# Patient Record
Sex: Female | Born: 1970
Health system: Southern US, Community
[De-identification: ages and names within clinical notes are randomized; demographics above are authoritative.]

## PROBLEM LIST (undated history)

## (undated) DIAGNOSIS — I82409 Acute embolism and thrombosis of unspecified deep veins of unspecified lower extremity: Secondary | ICD-10-CM

## (undated) DIAGNOSIS — I10 Essential (primary) hypertension: Secondary | ICD-10-CM

## (undated) DIAGNOSIS — E669 Obesity, unspecified: Secondary | ICD-10-CM

## (undated) DIAGNOSIS — F429 Obsessive-compulsive disorder, unspecified: Secondary | ICD-10-CM

## (undated) DIAGNOSIS — J40 Bronchitis, not specified as acute or chronic: Secondary | ICD-10-CM

## (undated) DIAGNOSIS — J45901 Unspecified asthma with (acute) exacerbation: Secondary | ICD-10-CM

## (undated) DIAGNOSIS — E119 Type 2 diabetes mellitus without complications: Secondary | ICD-10-CM

## (undated) DIAGNOSIS — D649 Anemia, unspecified: Secondary | ICD-10-CM

## (undated) DIAGNOSIS — G629 Polyneuropathy, unspecified: Secondary | ICD-10-CM

## (undated) DIAGNOSIS — M549 Dorsalgia, unspecified: Secondary | ICD-10-CM

## (undated) DIAGNOSIS — R011 Cardiac murmur, unspecified: Secondary | ICD-10-CM

## (undated) DIAGNOSIS — B9681 Helicobacter pylori [H. pylori] as the cause of diseases classified elsewhere: Secondary | ICD-10-CM

## (undated) DIAGNOSIS — K2970 Gastritis, unspecified, without bleeding: Secondary | ICD-10-CM

## (undated) DIAGNOSIS — F32A Depression, unspecified: Secondary | ICD-10-CM

## (undated) DIAGNOSIS — R109 Unspecified abdominal pain: Secondary | ICD-10-CM

## (undated) DIAGNOSIS — K219 Gastro-esophageal reflux disease without esophagitis: Secondary | ICD-10-CM

## (undated) DIAGNOSIS — G8929 Other chronic pain: Secondary | ICD-10-CM

## (undated) DIAGNOSIS — J45909 Unspecified asthma, uncomplicated: Secondary | ICD-10-CM

## (undated) DIAGNOSIS — K5909 Other constipation: Secondary | ICD-10-CM

## (undated) DIAGNOSIS — J302 Other seasonal allergic rhinitis: Secondary | ICD-10-CM

## (undated) DIAGNOSIS — F4310 Post-traumatic stress disorder, unspecified: Secondary | ICD-10-CM

## (undated) DIAGNOSIS — K589 Irritable bowel syndrome without diarrhea: Secondary | ICD-10-CM

## (undated) DIAGNOSIS — K5903 Drug induced constipation: Secondary | ICD-10-CM

## (undated) DIAGNOSIS — F060 Psychotic disorder with hallucinations due to known physiological condition: Secondary | ICD-10-CM

## (undated) DIAGNOSIS — G43909 Migraine, unspecified, not intractable, without status migrainosus: Secondary | ICD-10-CM

## (undated) DIAGNOSIS — R569 Unspecified convulsions: Secondary | ICD-10-CM

## (undated) DIAGNOSIS — R0602 Shortness of breath: Secondary | ICD-10-CM

## (undated) DIAGNOSIS — T402X5A Adverse effect of other opioids, initial encounter: Secondary | ICD-10-CM

## (undated) DIAGNOSIS — K56609 Unspecified intestinal obstruction, unspecified as to partial versus complete obstruction: Secondary | ICD-10-CM

## (undated) DIAGNOSIS — F329 Major depressive disorder, single episode, unspecified: Secondary | ICD-10-CM

## (undated) HISTORY — DX: Post-traumatic stress disorder, unspecified: F43.10

## (undated) HISTORY — DX: Drug induced constipation: K59.03

## (undated) HISTORY — PX: OOPHORECTOMY: SHX86

## (undated) HISTORY — DX: Helicobacter pylori (H. pylori) as the cause of diseases classified elsewhere: B96.81

## (undated) HISTORY — DX: Obsessive-compulsive disorder, unspecified: F42.9

## (undated) HISTORY — DX: Dorsalgia, unspecified: M54.9

## (undated) HISTORY — DX: Irritable bowel syndrome, unspecified: K58.9

## (undated) HISTORY — DX: Type 2 diabetes mellitus without complications: E11.9

## (undated) HISTORY — DX: Drug induced constipation: T40.2X5A

## (undated) HISTORY — PX: PARTIAL HYSTERECTOMY: SHX80

## (undated) HISTORY — PX: CARPAL TUNNEL RELEASE: SHX101

## (undated) HISTORY — PX: SPINE SURGERY: SHX786

## (undated) HISTORY — PX: COLON SURGERY: SHX602

## (undated) HISTORY — DX: Migraine, unspecified, not intractable, without status migrainosus: G43.909

## (undated) HISTORY — DX: Obesity, unspecified: E66.9

## (undated) HISTORY — DX: Acute embolism and thrombosis of unspecified deep veins of unspecified lower extremity: I82.409

## (undated) HISTORY — DX: Gastritis, unspecified, without bleeding: K29.70

## (undated) HISTORY — DX: Unspecified intestinal obstruction, unspecified as to partial versus complete obstruction: K56.609

## (undated) HISTORY — DX: Essential (primary) hypertension: I10

## (undated) HISTORY — PX: SPINAL CORD STIMULATOR IMPLANT: SHX2422

---
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
## 1992-11-04 HISTORY — PX: TUBAL LIGATION: SHX77

## 2001-04-20 ENCOUNTER — Emergency Department (HOSPITAL_COMMUNITY): Admission: EM | Admit: 2001-04-20 | Discharge: 2001-04-20 | Payer: Self-pay | Admitting: *Deleted

## 2001-11-04 HISTORY — PX: LYSIS OF ADHESION: SHX5961

## 2001-11-11 ENCOUNTER — Ambulatory Visit (HOSPITAL_BASED_OUTPATIENT_CLINIC_OR_DEPARTMENT_OTHER): Admission: RE | Admit: 2001-11-11 | Discharge: 2001-11-11 | Payer: Self-pay | Admitting: Orthopedic Surgery

## 2001-11-23 ENCOUNTER — Encounter: Admission: RE | Admit: 2001-11-23 | Discharge: 2002-02-21 | Payer: Self-pay | Admitting: Orthopedic Surgery

## 2002-02-09 ENCOUNTER — Emergency Department (HOSPITAL_COMMUNITY): Admission: EM | Admit: 2002-02-09 | Discharge: 2002-02-09 | Payer: Self-pay | Admitting: Emergency Medicine

## 2002-04-01 ENCOUNTER — Inpatient Hospital Stay (HOSPITAL_COMMUNITY): Admission: EM | Admit: 2002-04-01 | Discharge: 2002-04-07 | Payer: Self-pay | Admitting: Emergency Medicine

## 2002-04-01 ENCOUNTER — Encounter: Payer: Self-pay | Admitting: Emergency Medicine

## 2002-04-01 ENCOUNTER — Encounter: Payer: Self-pay | Admitting: General Surgery

## 2002-04-02 ENCOUNTER — Encounter: Payer: Self-pay | Admitting: Family Medicine

## 2002-04-17 ENCOUNTER — Emergency Department (HOSPITAL_COMMUNITY): Admission: EM | Admit: 2002-04-17 | Discharge: 2002-04-17 | Payer: Self-pay | Admitting: Internal Medicine

## 2002-04-22 ENCOUNTER — Encounter: Payer: Self-pay | Admitting: Internal Medicine

## 2002-04-23 ENCOUNTER — Observation Stay (HOSPITAL_COMMUNITY): Admission: EM | Admit: 2002-04-23 | Discharge: 2002-04-24 | Payer: Self-pay | Admitting: Internal Medicine

## 2002-04-23 ENCOUNTER — Encounter: Payer: Self-pay | Admitting: Internal Medicine

## 2002-04-24 ENCOUNTER — Encounter: Payer: Self-pay | Admitting: Family Medicine

## 2002-07-30 ENCOUNTER — Encounter: Payer: Self-pay | Admitting: General Surgery

## 2002-07-30 ENCOUNTER — Ambulatory Visit (HOSPITAL_COMMUNITY): Admission: RE | Admit: 2002-07-30 | Discharge: 2002-07-30 | Payer: Self-pay | Admitting: General Surgery

## 2002-09-09 ENCOUNTER — Emergency Department (HOSPITAL_COMMUNITY): Admission: EM | Admit: 2002-09-09 | Discharge: 2002-09-09 | Payer: Self-pay | Admitting: Emergency Medicine

## 2002-10-12 ENCOUNTER — Ambulatory Visit (HOSPITAL_COMMUNITY): Admission: RE | Admit: 2002-10-12 | Discharge: 2002-10-12 | Payer: Self-pay | Admitting: General Surgery

## 2002-10-12 ENCOUNTER — Encounter: Payer: Self-pay | Admitting: General Surgery

## 2002-11-05 ENCOUNTER — Emergency Department (HOSPITAL_COMMUNITY): Admission: EM | Admit: 2002-11-05 | Discharge: 2002-11-05 | Payer: Self-pay | Admitting: Emergency Medicine

## 2002-11-05 ENCOUNTER — Encounter: Payer: Self-pay | Admitting: Emergency Medicine

## 2002-11-11 ENCOUNTER — Ambulatory Visit (HOSPITAL_COMMUNITY): Admission: RE | Admit: 2002-11-11 | Discharge: 2002-11-11 | Payer: Self-pay | Admitting: General Surgery

## 2002-11-11 ENCOUNTER — Encounter: Payer: Self-pay | Admitting: General Surgery

## 2002-12-05 ENCOUNTER — Emergency Department (HOSPITAL_COMMUNITY): Admission: EM | Admit: 2002-12-05 | Discharge: 2002-12-05 | Payer: Self-pay | Admitting: *Deleted

## 2003-04-21 ENCOUNTER — Ambulatory Visit (HOSPITAL_COMMUNITY): Admission: RE | Admit: 2003-04-21 | Discharge: 2003-04-21 | Payer: Self-pay | Admitting: Family Medicine

## 2003-04-21 ENCOUNTER — Encounter: Payer: Self-pay | Admitting: Family Medicine

## 2003-07-04 ENCOUNTER — Ambulatory Visit (HOSPITAL_COMMUNITY): Admission: RE | Admit: 2003-07-04 | Discharge: 2003-07-04 | Payer: Self-pay | Admitting: Family Medicine

## 2003-07-04 ENCOUNTER — Encounter: Payer: Self-pay | Admitting: Family Medicine

## 2003-08-23 ENCOUNTER — Ambulatory Visit (HOSPITAL_COMMUNITY): Admission: RE | Admit: 2003-08-23 | Discharge: 2003-08-23 | Payer: Self-pay | Admitting: Neurology

## 2003-11-16 ENCOUNTER — Emergency Department (HOSPITAL_COMMUNITY): Admission: EM | Admit: 2003-11-16 | Discharge: 2003-11-16 | Payer: Self-pay | Admitting: Emergency Medicine

## 2004-07-10 ENCOUNTER — Ambulatory Visit: Payer: Self-pay | Admitting: Family Medicine

## 2004-08-25 ENCOUNTER — Emergency Department (HOSPITAL_COMMUNITY): Admission: EM | Admit: 2004-08-25 | Discharge: 2004-08-25 | Payer: Self-pay | Admitting: Emergency Medicine

## 2004-08-30 ENCOUNTER — Ambulatory Visit: Payer: Self-pay | Admitting: *Deleted

## 2004-09-05 ENCOUNTER — Ambulatory Visit: Payer: Self-pay | Admitting: Family Medicine

## 2004-10-08 ENCOUNTER — Ambulatory Visit: Payer: Self-pay | Admitting: Family Medicine

## 2004-10-09 ENCOUNTER — Ambulatory Visit (HOSPITAL_COMMUNITY): Admission: RE | Admit: 2004-10-09 | Discharge: 2004-10-09 | Payer: Self-pay | Admitting: Family Medicine

## 2004-10-23 ENCOUNTER — Ambulatory Visit (HOSPITAL_COMMUNITY): Admission: RE | Admit: 2004-10-23 | Discharge: 2004-10-23 | Payer: Self-pay | Admitting: Family Medicine

## 2004-12-03 ENCOUNTER — Ambulatory Visit: Payer: Self-pay | Admitting: Family Medicine

## 2004-12-20 ENCOUNTER — Encounter: Admission: RE | Admit: 2004-12-20 | Discharge: 2005-03-20 | Payer: Self-pay | Admitting: Orthopedic Surgery

## 2005-03-26 ENCOUNTER — Ambulatory Visit: Payer: Self-pay | Admitting: Family Medicine

## 2005-04-25 ENCOUNTER — Ambulatory Visit: Payer: Self-pay | Admitting: Family Medicine

## 2005-05-23 ENCOUNTER — Emergency Department (HOSPITAL_COMMUNITY): Admission: EM | Admit: 2005-05-23 | Discharge: 2005-05-23 | Payer: Self-pay | Admitting: Emergency Medicine

## 2005-07-04 ENCOUNTER — Inpatient Hospital Stay (HOSPITAL_COMMUNITY): Admission: RE | Admit: 2005-07-04 | Discharge: 2005-07-07 | Payer: Self-pay | Admitting: Orthopedic Surgery

## 2005-07-11 ENCOUNTER — Encounter: Admission: RE | Admit: 2005-07-11 | Discharge: 2005-10-09 | Payer: Self-pay | Admitting: Orthopedic Surgery

## 2005-09-09 ENCOUNTER — Ambulatory Visit: Payer: Self-pay | Admitting: Family Medicine

## 2005-09-10 ENCOUNTER — Ambulatory Visit (HOSPITAL_COMMUNITY): Admission: RE | Admit: 2005-09-10 | Discharge: 2005-09-10 | Payer: Self-pay | Admitting: Family Medicine

## 2005-09-16 ENCOUNTER — Ambulatory Visit: Payer: Self-pay | Admitting: Family Medicine

## 2005-11-21 ENCOUNTER — Encounter: Admission: RE | Admit: 2005-11-21 | Discharge: 2006-02-19 | Payer: Self-pay | Admitting: Orthopedic Surgery

## 2005-12-22 ENCOUNTER — Emergency Department (HOSPITAL_COMMUNITY): Admission: EM | Admit: 2005-12-22 | Discharge: 2005-12-22 | Payer: Self-pay | Admitting: Emergency Medicine

## 2005-12-27 ENCOUNTER — Ambulatory Visit: Payer: Self-pay | Admitting: Family Medicine

## 2006-01-15 ENCOUNTER — Ambulatory Visit: Payer: Self-pay | Admitting: Family Medicine

## 2006-02-07 ENCOUNTER — Emergency Department (HOSPITAL_COMMUNITY): Admission: EM | Admit: 2006-02-07 | Discharge: 2006-02-07 | Payer: Self-pay | Admitting: Emergency Medicine

## 2006-02-19 ENCOUNTER — Ambulatory Visit: Payer: Self-pay | Admitting: Family Medicine

## 2006-02-20 ENCOUNTER — Ambulatory Visit (HOSPITAL_COMMUNITY): Admission: RE | Admit: 2006-02-20 | Discharge: 2006-02-20 | Payer: Self-pay | Admitting: Unknown Physician Specialty

## 2006-02-28 ENCOUNTER — Encounter: Admission: RE | Admit: 2006-02-28 | Discharge: 2006-02-28 | Payer: Self-pay | Admitting: Family Medicine

## 2006-04-02 ENCOUNTER — Ambulatory Visit: Payer: Self-pay | Admitting: Family Medicine

## 2006-04-07 ENCOUNTER — Emergency Department (HOSPITAL_COMMUNITY): Admission: EM | Admit: 2006-04-07 | Discharge: 2006-04-07 | Payer: Self-pay | Admitting: Emergency Medicine

## 2006-05-12 ENCOUNTER — Ambulatory Visit: Payer: Self-pay | Admitting: Family Medicine

## 2006-05-12 ENCOUNTER — Other Ambulatory Visit: Admission: RE | Admit: 2006-05-12 | Discharge: 2006-05-12 | Payer: Self-pay | Admitting: Family Medicine

## 2006-08-12 ENCOUNTER — Ambulatory Visit: Payer: Self-pay | Admitting: Family Medicine

## 2006-08-15 ENCOUNTER — Encounter: Admission: RE | Admit: 2006-08-15 | Discharge: 2006-08-15 | Payer: Self-pay | Admitting: Family Medicine

## 2006-08-20 ENCOUNTER — Emergency Department (HOSPITAL_COMMUNITY): Admission: EM | Admit: 2006-08-20 | Discharge: 2006-08-20 | Payer: Self-pay | Admitting: Emergency Medicine

## 2006-09-04 ENCOUNTER — Ambulatory Visit: Payer: Self-pay | Admitting: Family Medicine

## 2006-09-20 ENCOUNTER — Emergency Department (HOSPITAL_COMMUNITY): Admission: EM | Admit: 2006-09-20 | Discharge: 2006-09-20 | Payer: Self-pay | Admitting: Emergency Medicine

## 2006-10-02 ENCOUNTER — Ambulatory Visit: Payer: Self-pay | Admitting: Family Medicine

## 2006-10-09 ENCOUNTER — Ambulatory Visit (HOSPITAL_COMMUNITY): Admission: RE | Admit: 2006-10-09 | Discharge: 2006-10-09 | Payer: Self-pay | Admitting: Family Medicine

## 2006-10-22 ENCOUNTER — Ambulatory Visit: Payer: Self-pay | Admitting: Family Medicine

## 2006-12-16 ENCOUNTER — Ambulatory Visit: Payer: Self-pay | Admitting: Family Medicine

## 2007-01-02 ENCOUNTER — Encounter: Payer: Self-pay | Admitting: Family Medicine

## 2007-01-02 LAB — CONVERTED CEMR LAB
AST: 17 units/L (ref 0–37)
Albumin: 4.4 g/dL (ref 3.5–5.2)
Alkaline Phosphatase: 74 units/L (ref 39–117)
Basophils Absolute: 0.1 10*3/uL (ref 0.0–0.1)
Basophils Relative: 1 % (ref 0–1)
Bilirubin, Direct: 0.1 mg/dL (ref 0.0–0.3)
Cholesterol: 173 mg/dL (ref 0–200)
Creatinine, Ser: 0.72 mg/dL (ref 0.40–1.20)
Eosinophils Absolute: 0.3 10*3/uL (ref 0.0–0.7)
Eosinophils Relative: 5 % (ref 0–5)
Indirect Bilirubin: 0.3 mg/dL (ref 0.0–0.9)
Lymphs Abs: 2.2 10*3/uL (ref 0.7–3.3)
MCV: 89.7 fL (ref 78.0–100.0)
Monocytes Absolute: 0.6 10*3/uL (ref 0.2–0.7)
Monocytes Relative: 10 % (ref 3–11)
Neutrophils Relative %: 45 % (ref 43–77)
Potassium: 4.1 meq/L (ref 3.5–5.3)
RBC: 4.26 M/uL (ref 3.87–5.11)
RDW: 14.3 % — ABNORMAL HIGH (ref 11.5–14.0)
Total Bilirubin: 0.4 mg/dL (ref 0.3–1.2)
Total CHOL/HDL Ratio: 2.7
Triglycerides: 67 mg/dL (ref <150)
VLDL: 13 mg/dL (ref 0–40)

## 2007-01-30 ENCOUNTER — Encounter: Admission: RE | Admit: 2007-01-30 | Discharge: 2007-01-30 | Payer: Self-pay | Admitting: Family Medicine

## 2007-03-21 ENCOUNTER — Emergency Department (HOSPITAL_COMMUNITY): Admission: EM | Admit: 2007-03-21 | Discharge: 2007-03-22 | Payer: Self-pay | Admitting: Emergency Medicine

## 2007-03-22 ENCOUNTER — Emergency Department (HOSPITAL_COMMUNITY): Admission: EM | Admit: 2007-03-22 | Discharge: 2007-03-22 | Payer: Self-pay | Admitting: Emergency Medicine

## 2007-03-23 ENCOUNTER — Ambulatory Visit: Payer: Self-pay | Admitting: Family Medicine

## 2007-05-13 ENCOUNTER — Encounter: Payer: Self-pay | Admitting: Family Medicine

## 2007-05-13 ENCOUNTER — Ambulatory Visit: Payer: Self-pay | Admitting: Family Medicine

## 2007-05-13 ENCOUNTER — Other Ambulatory Visit: Admission: RE | Admit: 2007-05-13 | Discharge: 2007-05-13 | Payer: Self-pay | Admitting: Family Medicine

## 2007-05-13 LAB — CONVERTED CEMR LAB: Chlamydia, DNA Probe: NEGATIVE

## 2007-05-14 ENCOUNTER — Encounter: Payer: Self-pay | Admitting: Family Medicine

## 2007-05-14 LAB — CONVERTED CEMR LAB
Candida species: NEGATIVE
Gardnerella vaginalis: POSITIVE — AB

## 2007-07-28 ENCOUNTER — Ambulatory Visit: Payer: Self-pay | Admitting: Family Medicine

## 2007-07-31 ENCOUNTER — Ambulatory Visit (HOSPITAL_COMMUNITY): Admission: RE | Admit: 2007-07-31 | Discharge: 2007-07-31 | Payer: Self-pay | Admitting: Family Medicine

## 2007-08-04 ENCOUNTER — Ambulatory Visit: Payer: Self-pay | Admitting: Family Medicine

## 2007-09-24 ENCOUNTER — Ambulatory Visit: Payer: Self-pay | Admitting: Family Medicine

## 2007-09-28 ENCOUNTER — Ambulatory Visit: Payer: Self-pay | Admitting: Family Medicine

## 2007-11-05 HISTORY — PX: TRIGGER FINGER RELEASE: SHX641

## 2007-11-23 ENCOUNTER — Ambulatory Visit: Payer: Self-pay | Admitting: Family Medicine

## 2007-11-27 ENCOUNTER — Encounter: Payer: Self-pay | Admitting: Family Medicine

## 2007-11-27 DIAGNOSIS — M549 Dorsalgia, unspecified: Secondary | ICD-10-CM | POA: Insufficient documentation

## 2007-11-27 DIAGNOSIS — G43909 Migraine, unspecified, not intractable, without status migrainosus: Secondary | ICD-10-CM | POA: Insufficient documentation

## 2007-11-27 DIAGNOSIS — I10 Essential (primary) hypertension: Secondary | ICD-10-CM | POA: Insufficient documentation

## 2008-01-25 ENCOUNTER — Ambulatory Visit: Payer: Self-pay | Admitting: Family Medicine

## 2008-06-03 ENCOUNTER — Emergency Department (HOSPITAL_COMMUNITY): Admission: EM | Admit: 2008-06-03 | Discharge: 2008-06-03 | Payer: Self-pay | Admitting: Emergency Medicine

## 2008-11-04 DIAGNOSIS — I82409 Acute embolism and thrombosis of unspecified deep veins of unspecified lower extremity: Secondary | ICD-10-CM

## 2008-11-04 HISTORY — DX: Acute embolism and thrombosis of unspecified deep veins of unspecified lower extremity: I82.409

## 2008-11-04 HISTORY — PX: LUMBAR SPINE SURGERY: SHX701

## 2008-12-30 ENCOUNTER — Emergency Department (HOSPITAL_COMMUNITY): Admission: EM | Admit: 2008-12-30 | Discharge: 2008-12-30 | Payer: Self-pay | Admitting: Emergency Medicine

## 2009-01-05 ENCOUNTER — Ambulatory Visit: Payer: Self-pay | Admitting: Family Medicine

## 2009-01-05 DIAGNOSIS — R7303 Prediabetes: Secondary | ICD-10-CM | POA: Insufficient documentation

## 2009-01-06 ENCOUNTER — Telehealth: Payer: Self-pay | Admitting: Family Medicine

## 2009-01-08 DIAGNOSIS — F319 Bipolar disorder, unspecified: Secondary | ICD-10-CM | POA: Insufficient documentation

## 2009-01-12 ENCOUNTER — Telehealth: Payer: Self-pay | Admitting: Family Medicine

## 2009-02-06 ENCOUNTER — Emergency Department (HOSPITAL_COMMUNITY): Admission: EM | Admit: 2009-02-06 | Discharge: 2009-02-06 | Payer: Self-pay | Admitting: Emergency Medicine

## 2009-02-08 ENCOUNTER — Telehealth: Payer: Self-pay | Admitting: Family Medicine

## 2009-03-09 ENCOUNTER — Ambulatory Visit: Payer: Self-pay | Admitting: Family Medicine

## 2009-03-09 LAB — CONVERTED CEMR LAB: Hgb A1c MFr Bld: 5.8 %

## 2009-04-13 ENCOUNTER — Ambulatory Visit: Payer: Self-pay | Admitting: Family Medicine

## 2009-04-14 ENCOUNTER — Telehealth: Payer: Self-pay | Admitting: Family Medicine

## 2009-04-17 ENCOUNTER — Ambulatory Visit (HOSPITAL_COMMUNITY): Admission: RE | Admit: 2009-04-17 | Discharge: 2009-04-17 | Payer: Self-pay | Admitting: Family Medicine

## 2009-04-18 ENCOUNTER — Telehealth: Payer: Self-pay | Admitting: Family Medicine

## 2009-04-18 ENCOUNTER — Encounter: Payer: Self-pay | Admitting: Family Medicine

## 2009-04-20 ENCOUNTER — Ambulatory Visit (HOSPITAL_COMMUNITY): Admission: RE | Admit: 2009-04-20 | Discharge: 2009-04-20 | Payer: Self-pay | Admitting: Family Medicine

## 2009-04-25 ENCOUNTER — Encounter: Payer: Self-pay | Admitting: Family Medicine

## 2009-05-11 ENCOUNTER — Other Ambulatory Visit: Admission: RE | Admit: 2009-05-11 | Discharge: 2009-05-11 | Payer: Self-pay | Admitting: Family Medicine

## 2009-05-11 ENCOUNTER — Ambulatory Visit: Payer: Self-pay | Admitting: Family Medicine

## 2009-05-11 ENCOUNTER — Encounter: Payer: Self-pay | Admitting: Family Medicine

## 2009-05-16 ENCOUNTER — Ambulatory Visit (HOSPITAL_COMMUNITY): Admission: RE | Admit: 2009-05-16 | Discharge: 2009-05-18 | Payer: Self-pay | Admitting: Neurosurgery

## 2009-05-17 ENCOUNTER — Encounter: Payer: Self-pay | Admitting: Family Medicine

## 2009-06-23 ENCOUNTER — Encounter: Admission: RE | Admit: 2009-06-23 | Discharge: 2009-08-22 | Payer: Self-pay | Admitting: Neurosurgery

## 2009-06-27 ENCOUNTER — Ambulatory Visit: Payer: Self-pay | Admitting: Family Medicine

## 2009-06-28 ENCOUNTER — Encounter: Payer: Self-pay | Admitting: Family Medicine

## 2009-06-28 LAB — CONVERTED CEMR LAB
Chlamydia, DNA Probe: NEGATIVE
GC Probe Amp, Genital: NEGATIVE

## 2009-06-29 ENCOUNTER — Telehealth: Payer: Self-pay | Admitting: Family Medicine

## 2009-06-29 ENCOUNTER — Encounter: Payer: Self-pay | Admitting: Family Medicine

## 2009-06-29 LAB — CONVERTED CEMR LAB
Candida species: NEGATIVE
Gardnerella vaginalis: POSITIVE — AB

## 2009-08-11 ENCOUNTER — Ambulatory Visit: Payer: Self-pay | Admitting: Family Medicine

## 2009-08-14 ENCOUNTER — Encounter: Payer: Self-pay | Admitting: Family Medicine

## 2009-08-21 ENCOUNTER — Encounter: Admission: RE | Admit: 2009-08-21 | Discharge: 2009-08-21 | Payer: Self-pay | Admitting: Neurosurgery

## 2009-08-24 ENCOUNTER — Encounter: Payer: Self-pay | Admitting: Family Medicine

## 2009-09-12 ENCOUNTER — Inpatient Hospital Stay (HOSPITAL_COMMUNITY): Admission: RE | Admit: 2009-09-12 | Discharge: 2009-10-03 | Payer: Self-pay | Admitting: Neurosurgery

## 2009-09-19 ENCOUNTER — Encounter (INDEPENDENT_AMBULATORY_CARE_PROVIDER_SITE_OTHER): Payer: Self-pay | Admitting: Internal Medicine

## 2009-09-19 ENCOUNTER — Ambulatory Visit: Payer: Self-pay | Admitting: Surgery

## 2009-09-20 ENCOUNTER — Telehealth: Payer: Self-pay | Admitting: Family Medicine

## 2009-09-21 ENCOUNTER — Telehealth: Payer: Self-pay | Admitting: Family Medicine

## 2009-10-03 ENCOUNTER — Inpatient Hospital Stay (HOSPITAL_COMMUNITY): Admission: EM | Admit: 2009-10-03 | Discharge: 2009-10-06 | Payer: Self-pay | Admitting: Psychiatry

## 2009-10-03 ENCOUNTER — Ambulatory Visit: Payer: Self-pay | Admitting: Psychiatry

## 2009-10-06 ENCOUNTER — Encounter: Payer: Self-pay | Admitting: Family Medicine

## 2009-10-07 ENCOUNTER — Telehealth: Payer: Self-pay | Admitting: Family Medicine

## 2009-10-07 ENCOUNTER — Encounter (INDEPENDENT_AMBULATORY_CARE_PROVIDER_SITE_OTHER): Payer: Self-pay | Admitting: Emergency Medicine

## 2009-10-07 ENCOUNTER — Emergency Department (HOSPITAL_COMMUNITY): Admission: EM | Admit: 2009-10-07 | Discharge: 2009-10-07 | Payer: Self-pay | Admitting: Emergency Medicine

## 2009-10-09 ENCOUNTER — Telehealth: Payer: Self-pay | Admitting: Family Medicine

## 2009-10-10 ENCOUNTER — Telehealth: Payer: Self-pay | Admitting: Family Medicine

## 2009-10-10 ENCOUNTER — Ambulatory Visit: Payer: Self-pay | Admitting: Family Medicine

## 2009-10-10 LAB — CONVERTED CEMR LAB: Prothrombin Time: 15.8 s — ABNORMAL HIGH (ref 11.6–15.2)

## 2009-10-11 ENCOUNTER — Telehealth: Payer: Self-pay | Admitting: Family Medicine

## 2009-10-12 ENCOUNTER — Ambulatory Visit: Payer: Self-pay | Admitting: Family Medicine

## 2009-10-12 ENCOUNTER — Emergency Department (HOSPITAL_COMMUNITY): Admission: EM | Admit: 2009-10-12 | Discharge: 2009-10-12 | Payer: Self-pay | Admitting: Emergency Medicine

## 2009-10-13 ENCOUNTER — Encounter: Payer: Self-pay | Admitting: Family Medicine

## 2009-10-16 ENCOUNTER — Telehealth: Payer: Self-pay | Admitting: Family Medicine

## 2009-10-17 ENCOUNTER — Telehealth: Payer: Self-pay | Admitting: Family Medicine

## 2009-10-18 ENCOUNTER — Encounter: Payer: Self-pay | Admitting: Family Medicine

## 2009-10-20 ENCOUNTER — Ambulatory Visit: Payer: Self-pay | Admitting: Family Medicine

## 2009-10-20 ENCOUNTER — Telehealth: Payer: Self-pay | Admitting: Family Medicine

## 2009-10-23 ENCOUNTER — Encounter: Payer: Self-pay | Admitting: Family Medicine

## 2009-10-25 ENCOUNTER — Telehealth: Payer: Self-pay | Admitting: Family Medicine

## 2009-10-26 ENCOUNTER — Telehealth: Payer: Self-pay | Admitting: Family Medicine

## 2009-10-31 ENCOUNTER — Encounter: Payer: Self-pay | Admitting: Family Medicine

## 2009-11-01 ENCOUNTER — Encounter: Payer: Self-pay | Admitting: Family Medicine

## 2009-11-01 ENCOUNTER — Telehealth: Payer: Self-pay | Admitting: Family Medicine

## 2009-11-06 ENCOUNTER — Encounter: Admission: RE | Admit: 2009-11-06 | Discharge: 2009-11-20 | Payer: Self-pay | Admitting: Neurosurgery

## 2009-11-06 ENCOUNTER — Telehealth: Payer: Self-pay | Admitting: Family Medicine

## 2009-11-09 ENCOUNTER — Encounter: Payer: Self-pay | Admitting: Family Medicine

## 2009-11-10 ENCOUNTER — Ambulatory Visit: Payer: Self-pay | Admitting: Family Medicine

## 2009-11-10 LAB — CONVERTED CEMR LAB: INR: 3.83 — ABNORMAL HIGH (ref <1.50)

## 2009-11-13 ENCOUNTER — Telehealth: Payer: Self-pay | Admitting: Family Medicine

## 2009-11-13 ENCOUNTER — Encounter: Payer: Self-pay | Admitting: Family Medicine

## 2009-11-14 ENCOUNTER — Emergency Department (HOSPITAL_COMMUNITY): Admission: EM | Admit: 2009-11-14 | Discharge: 2009-11-15 | Payer: Self-pay | Admitting: Emergency Medicine

## 2009-11-14 ENCOUNTER — Telehealth: Payer: Self-pay | Admitting: Family Medicine

## 2009-11-15 ENCOUNTER — Telehealth: Payer: Self-pay | Admitting: Family Medicine

## 2009-11-16 ENCOUNTER — Ambulatory Visit: Payer: Self-pay | Admitting: Family Medicine

## 2009-11-16 ENCOUNTER — Telehealth: Payer: Self-pay | Admitting: Family Medicine

## 2009-11-20 ENCOUNTER — Telehealth: Payer: Self-pay | Admitting: Family Medicine

## 2009-11-22 ENCOUNTER — Telehealth: Payer: Self-pay | Admitting: Family Medicine

## 2009-11-28 ENCOUNTER — Encounter: Payer: Self-pay | Admitting: Family Medicine

## 2009-12-11 ENCOUNTER — Encounter: Payer: Self-pay | Admitting: Family Medicine

## 2009-12-12 ENCOUNTER — Telehealth: Payer: Self-pay | Admitting: Family Medicine

## 2009-12-13 ENCOUNTER — Encounter: Payer: Self-pay | Admitting: Family Medicine

## 2009-12-14 LAB — CONVERTED CEMR LAB: INR: 3.83 — ABNORMAL HIGH (ref <1.50)

## 2009-12-20 ENCOUNTER — Ambulatory Visit: Payer: Self-pay | Admitting: Family Medicine

## 2009-12-25 ENCOUNTER — Telehealth: Payer: Self-pay | Admitting: Family Medicine

## 2009-12-29 ENCOUNTER — Encounter: Payer: Self-pay | Admitting: Family Medicine

## 2010-01-17 ENCOUNTER — Ambulatory Visit: Payer: Self-pay | Admitting: Family Medicine

## 2010-01-19 LAB — CONVERTED CEMR LAB: INR: 3.14 — ABNORMAL HIGH (ref <1.50)

## 2010-01-24 ENCOUNTER — Telehealth: Payer: Self-pay | Admitting: Physician Assistant

## 2010-01-25 ENCOUNTER — Telehealth: Payer: Self-pay | Admitting: Family Medicine

## 2010-02-05 ENCOUNTER — Telehealth: Payer: Self-pay | Admitting: Family Medicine

## 2010-02-05 ENCOUNTER — Encounter: Payer: Self-pay | Admitting: Family Medicine

## 2010-02-06 ENCOUNTER — Encounter: Payer: Self-pay | Admitting: Family Medicine

## 2010-02-07 ENCOUNTER — Inpatient Hospital Stay (HOSPITAL_COMMUNITY): Admission: EM | Admit: 2010-02-07 | Discharge: 2010-02-10 | Payer: Self-pay | Admitting: Emergency Medicine

## 2010-02-07 ENCOUNTER — Ambulatory Visit: Payer: Self-pay | Admitting: Family Medicine

## 2010-02-11 ENCOUNTER — Telehealth: Payer: Self-pay | Admitting: Family Medicine

## 2010-02-12 ENCOUNTER — Telehealth: Payer: Self-pay | Admitting: Family Medicine

## 2010-02-13 ENCOUNTER — Encounter: Payer: Self-pay | Admitting: Family Medicine

## 2010-02-14 ENCOUNTER — Encounter: Payer: Self-pay | Admitting: Family Medicine

## 2010-02-15 ENCOUNTER — Telehealth: Payer: Self-pay | Admitting: Physician Assistant

## 2010-02-16 ENCOUNTER — Inpatient Hospital Stay (HOSPITAL_COMMUNITY): Admission: EM | Admit: 2010-02-16 | Discharge: 2010-02-27 | Payer: Self-pay | Admitting: Emergency Medicine

## 2010-02-18 ENCOUNTER — Ambulatory Visit: Payer: Self-pay | Admitting: Psychiatry

## 2010-02-19 ENCOUNTER — Telehealth: Payer: Self-pay | Admitting: Family Medicine

## 2010-02-19 ENCOUNTER — Encounter: Payer: Self-pay | Admitting: Family Medicine

## 2010-02-21 ENCOUNTER — Ambulatory Visit: Payer: Self-pay | Admitting: Family Medicine

## 2010-02-27 ENCOUNTER — Encounter: Payer: Self-pay | Admitting: Family Medicine

## 2010-03-01 ENCOUNTER — Ambulatory Visit: Payer: Self-pay | Admitting: Family Medicine

## 2010-03-01 ENCOUNTER — Encounter (INDEPENDENT_AMBULATORY_CARE_PROVIDER_SITE_OTHER): Payer: Self-pay

## 2010-03-01 LAB — CONVERTED CEMR LAB
BUN: 13 mg/dL (ref 6–23)
CO2: 29 meq/L (ref 19–32)
Chloride: 105 meq/L (ref 96–112)
Glucose, Bld: 90 mg/dL (ref 70–99)
INR: 2.3 — ABNORMAL HIGH (ref <1.50)
Potassium: 3.8 meq/L (ref 3.5–5.3)

## 2010-03-02 ENCOUNTER — Telehealth: Payer: Self-pay | Admitting: Family Medicine

## 2010-03-04 DIAGNOSIS — J45991 Cough variant asthma: Secondary | ICD-10-CM | POA: Insufficient documentation

## 2010-03-04 DIAGNOSIS — F060 Psychotic disorder with hallucinations due to known physiological condition: Secondary | ICD-10-CM | POA: Insufficient documentation

## 2010-03-04 HISTORY — DX: Psychotic disorder with hallucinations due to known physiological condition: F06.0

## 2010-03-06 ENCOUNTER — Telehealth: Payer: Self-pay | Admitting: Family Medicine

## 2010-03-06 ENCOUNTER — Encounter: Payer: Self-pay | Admitting: Family Medicine

## 2010-03-07 ENCOUNTER — Encounter: Payer: Self-pay | Admitting: Family Medicine

## 2010-03-24 ENCOUNTER — Emergency Department (HOSPITAL_COMMUNITY): Admission: EM | Admit: 2010-03-24 | Discharge: 2010-03-24 | Payer: Self-pay | Admitting: Emergency Medicine

## 2010-03-26 ENCOUNTER — Telehealth: Payer: Self-pay | Admitting: Family Medicine

## 2010-03-26 ENCOUNTER — Encounter: Payer: Self-pay | Admitting: Family Medicine

## 2010-03-27 ENCOUNTER — Ambulatory Visit: Payer: Self-pay | Admitting: Family Medicine

## 2010-03-28 ENCOUNTER — Telehealth: Payer: Self-pay | Admitting: Family Medicine

## 2010-03-29 ENCOUNTER — Encounter: Payer: Self-pay | Admitting: Family Medicine

## 2010-03-29 ENCOUNTER — Encounter (INDEPENDENT_AMBULATORY_CARE_PROVIDER_SITE_OTHER): Payer: Self-pay

## 2010-04-04 ENCOUNTER — Encounter: Payer: Self-pay | Admitting: Physician Assistant

## 2010-04-04 ENCOUNTER — Encounter: Payer: Self-pay | Admitting: Family Medicine

## 2010-04-10 ENCOUNTER — Encounter: Payer: Self-pay | Admitting: Family Medicine

## 2010-04-11 ENCOUNTER — Ambulatory Visit: Payer: Self-pay | Admitting: Family Medicine

## 2010-04-11 LAB — CONVERTED CEMR LAB
INR: 2.17 — ABNORMAL HIGH (ref <1.50)
Prothrombin Time: 18.7 s — ABNORMAL HIGH (ref 10.2–14.0)

## 2010-04-17 DIAGNOSIS — J309 Allergic rhinitis, unspecified: Secondary | ICD-10-CM | POA: Insufficient documentation

## 2010-04-20 ENCOUNTER — Encounter: Payer: Self-pay | Admitting: Family Medicine

## 2010-05-03 ENCOUNTER — Telehealth: Payer: Self-pay | Admitting: Family Medicine

## 2010-05-04 ENCOUNTER — Telehealth: Payer: Self-pay | Admitting: Family Medicine

## 2010-05-04 ENCOUNTER — Encounter: Admission: RE | Admit: 2010-05-04 | Discharge: 2010-05-04 | Payer: Self-pay | Admitting: Family Medicine

## 2010-05-08 ENCOUNTER — Ambulatory Visit: Payer: Self-pay | Admitting: Family Medicine

## 2010-05-09 ENCOUNTER — Encounter: Admission: RE | Admit: 2010-05-09 | Discharge: 2010-05-09 | Payer: Self-pay | Admitting: Family Medicine

## 2010-05-14 ENCOUNTER — Encounter: Admission: RE | Admit: 2010-05-14 | Discharge: 2010-05-14 | Payer: Self-pay | Admitting: Neurosurgery

## 2010-06-12 ENCOUNTER — Emergency Department (HOSPITAL_COMMUNITY): Admission: EM | Admit: 2010-06-12 | Discharge: 2010-06-12 | Payer: Self-pay | Admitting: Emergency Medicine

## 2010-06-12 ENCOUNTER — Ambulatory Visit: Payer: Self-pay | Admitting: Family Medicine

## 2010-06-12 ENCOUNTER — Other Ambulatory Visit: Admission: RE | Admit: 2010-06-12 | Discharge: 2010-06-12 | Payer: Self-pay | Admitting: Family Medicine

## 2010-06-12 DIAGNOSIS — R5383 Other fatigue: Secondary | ICD-10-CM

## 2010-06-12 DIAGNOSIS — R5381 Other malaise: Secondary | ICD-10-CM | POA: Insufficient documentation

## 2010-06-12 LAB — CONVERTED CEMR LAB: OCCULT 1: NEGATIVE

## 2010-06-13 ENCOUNTER — Encounter: Payer: Self-pay | Admitting: Family Medicine

## 2010-06-15 ENCOUNTER — Emergency Department (HOSPITAL_COMMUNITY): Admission: EM | Admit: 2010-06-15 | Discharge: 2010-06-15 | Payer: Self-pay | Admitting: Emergency Medicine

## 2010-06-19 ENCOUNTER — Telehealth: Payer: Self-pay | Admitting: Family Medicine

## 2010-06-20 ENCOUNTER — Telehealth: Payer: Self-pay | Admitting: Family Medicine

## 2010-06-21 ENCOUNTER — Ambulatory Visit: Payer: Self-pay | Admitting: Family Medicine

## 2010-06-28 ENCOUNTER — Encounter: Payer: Self-pay | Admitting: Family Medicine

## 2010-06-29 ENCOUNTER — Encounter: Payer: Self-pay | Admitting: Family Medicine

## 2010-07-03 LAB — CONVERTED CEMR LAB
CO2: 20 meq/L (ref 19–32)
Glucose, Bld: 110 mg/dL — ABNORMAL HIGH (ref 70–99)
Potassium: 3.3 meq/L — ABNORMAL LOW (ref 3.5–5.3)
Sodium: 141 meq/L (ref 135–145)

## 2010-07-19 ENCOUNTER — Ambulatory Visit: Payer: Self-pay | Admitting: Family Medicine

## 2010-07-19 DIAGNOSIS — R7301 Impaired fasting glucose: Secondary | ICD-10-CM | POA: Insufficient documentation

## 2010-07-20 LAB — CONVERTED CEMR LAB
BUN: 8 mg/dL (ref 6–23)
Basophils Absolute: 0 10*3/uL (ref 0.0–0.1)
Basophils Relative: 0 % (ref 0–1)
Calcium: 9.3 mg/dL (ref 8.4–10.5)
Chloride: 106 meq/L (ref 96–112)
Creatinine, Ser: 0.77 mg/dL (ref 0.40–1.20)
Eosinophils Relative: 5 % (ref 0–5)
Ferritin: 43 ng/mL (ref 10–291)
HCT: 36.4 % (ref 36.0–46.0)
Hemoglobin: 12.9 g/dL (ref 12.0–15.0)
MCV: 92.9 fL (ref 78.0–100.0)
Monocytes Absolute: 0.5 10*3/uL (ref 0.1–1.0)
Monocytes Relative: 9 % (ref 3–12)
RBC: 3.92 M/uL (ref 3.87–5.11)
RDW: 13.5 % (ref 11.5–15.5)
Sodium: 138 meq/L (ref 135–145)

## 2010-08-30 ENCOUNTER — Ambulatory Visit: Payer: Self-pay | Admitting: Family Medicine

## 2010-08-30 DIAGNOSIS — F418 Other specified anxiety disorders: Secondary | ICD-10-CM | POA: Insufficient documentation

## 2010-09-03 ENCOUNTER — Encounter: Payer: Self-pay | Admitting: Family Medicine

## 2010-11-23 ENCOUNTER — Telehealth: Payer: Self-pay | Admitting: Family Medicine

## 2010-11-25 ENCOUNTER — Encounter: Payer: Self-pay | Admitting: Family Medicine

## 2010-11-26 ENCOUNTER — Encounter: Payer: Self-pay | Admitting: Family Medicine

## 2010-11-30 ENCOUNTER — Encounter: Payer: Self-pay | Admitting: Family Medicine

## 2010-12-04 NOTE — Progress Notes (Signed)
Summary: AIDE  Phone Note Call from Patient   Summary of Call: NEEDS FOR U TO CALL HER SHE NEEDS AN AIDE  CALL BACK AT [phone] Initial call taken by: Lind Guest,  February 05, 2010 9:59 AM  Follow-up for Phone Call        does this patient qualify for an aide in the home? Follow-up by: Adella Hare LPN,  February 05, 2010 11:30 AM  Additional Follow-up for Phone Call Additional follow up Details #1::        yes she needs assistance with ADL's due to lower ext weakness from nerve damage , she is non ambulatory without assistive device, ps refer to company of her choice, aid 5 days perweek 4 to 6 hrs daily Additional Follow-up by: Syliva Overman MD,  February 06, 2010 12:24 AM    Additional Follow-up for Phone Call Additional follow up Details #2::    INTERMIN  WHERE SHE WANTS TO CALL CALL HER ASAP  [phone] Follow-up by: Lind Guest,  February 06, 2010 3:45 PM  Additional Follow-up for Phone Call Additional follow up Details #3:: Details for Additional Follow-up Action Taken: form completed Additional Follow-up by: Adella Hare LPN,  February 09, 2010 4:18 PM

## 2010-12-04 NOTE — Progress Notes (Signed)
Summary: [facility]   Imported By: Lind Guest 02/19/2010 10:02:40  _____________________________________________________________________  External Attachment:    Type:   Image     Comment:   External Document

## 2010-12-04 NOTE — Letter (Signed)
Summary: medical release  medical release   Imported By: Lind Guest 12/29/2009 14:39:23  _____________________________________________________________________  External Attachment:    Type:   Image     Comment:   External Document

## 2010-12-04 NOTE — Miscellaneous (Signed)
Summary: Home Care Report  Home Care Report   Imported By: Lind Guest 03/06/2010 09:18:22  _____________________________________________________________________  External Attachment:    Type:   Image     Comment:   External Document

## 2010-12-04 NOTE — Assessment & Plan Note (Signed)
Summary: F UP   Vital Signs:  Patient profile:   40 year old female Menstrual status:  regular Height:      63 inches Weight:      170 pounds BMI:     30.22 O2 Sat:      97 % on Room air Pulse rate:   106 / minute Pulse rhythm:   regular Resp:     16 per minute BP sitting:   126 / 80  (left arm)  Vitals Entered By: Mauricia Area CMA (August 30, 2010 1:05 PM)  Nutrition Counseling: [name] BMI is greater than 25 and therefore counseled on weight management options.  O2 Flow:  Room air CC: follow up   Primary Care Gael Delude:  Syliva Overman MD  CC:  follow up.  History of Present Illness: Reports  that she has been doing well. Denies recent fever or chills. Denies sinus pressure, nasal congestion , ear pain or sore throat. Denies chest congestion, or cough productive of sputum. Denies chest pain, palpitations, PND, orthopnea or leg swelling. Denies abdominal pain, nausea, vomitting, diarrhea or constipation. Denies change in bowel movements or bloody stool. Denies dysuria , frequency, incontinence or hesitancy. Denies  joint pain, swelling, or reduced mobility. Denies headaches, vertigo, seizures. Denies depression, anxiety or insomnia. Denies  rash, lesions, or itch.     Current Medications (verified): 1)  Amlodipine Besylate 10 Mg Tabs (Amlodipine Besylate) .... Take 1 Tablet By Mouth Once A Day 2)  Zyrtec Hives Relief 10 Mg Tabs (Cetirizine Hcl) .... Take 1 Tablet By Mouth Once A Day 3)  Xanax 0.5 Mg Tabs (Alprazolam) .... Take 1 Tablet By Mouth Three Times A Day 4)  Ventolin Hfa 108 (90 Base) Mcg/act Aers (Albuterol Sulfate) .... 2 Puffs Two Times A Day 5)  Klor-Con M20 20 Meq Cr-Tabs (Potassium Chloride Crys Cr) .... One Tab By Mouth Three Times A Day 6)  B-100 Complex  Tabs (Vitamins-Lipotropics) .... Take 1 Tablet By Mouth Once A Day 7)  Senokot S 8.6-50 Mg Tabs (Sennosides-Docusate Sodium) .... Take 1 Tablet By Mouth Two Times A Day 8)  Albuterol Sulfate  (2.5 Mg/26ml) 0.083% Nebu (Albuterol Sulfate) .... Use On Vial Per Nebulizer Every 6 Hours As Needed For Wheezing 9)  Ipratropium Bromide 0.02 % Soln (Ipratropium Bromide) .... Use One Vial Per Nebulizer Every 6 Hours As Needed For Wheezing  Allergies (verified): 1)  ! Pcn  Review of Systems      See HPI Eyes:  Denies blurring and discharge. Psych:  Complains of anxiety, irritability, and mental problems; denies depression, suicidal thoughts/plans, thoughts of violence, and unusual visions or sounds. Endo:  Denies cold intolerance, excessive hunger, excessive thirst, and excessive urination. Heme:  Denies abnormal bruising and bleeding. Allergy:  Denies hives or rash and itching eyes.  Physical Exam  General:  Well-developed,well-nourished,in no acute distress; alert,appropriate and cooperative throughout examination HEENT: No facial asymmetry,  EOMI, No sinus tenderness, TM's Clear, oropharynx  pink and moist.   Chest: Clear to auscultation bilaterally.  CVS: S1, S2, No murmurs, No S3.   Abd: Soft, Nontender.  MS: Adequate ROM spine, hips, shoulders and knees.  Ext: No edema.   CNS: CN 2-12 intact, power tone and sensation normal throughout.   Skin: Intact, no visible lesions or rashes.  Psych: Good eye contact, normal affect.  Memory intact, not anxious or depressed appearing.    Impression & Recommendations:  Problem # 1:  ANXIETY STATE, UNSPECIFIED (ICD-300.00) Assessment Improved  Her updated  medication list for this problem includes:    Xanax 0.5 Mg Tabs (Alprazolam) .Marland Kitchen... Take 1 tablet by mouth three times a day    Buspirone Hcl 7.5 Mg Tabs (Buspirone hcl) .Marland Kitchen... Take 1 tablet by mouth two times a day  Problem # 2:  DEPRESSION, CHRONIC (ICD-311) Assessment: Improved  Her updated medication list for this problem includes:    Xanax 0.5 Mg Tabs (Alprazolam) .Marland Kitchen... Take 1 tablet by mouth three times a day    Buspirone Hcl 7.5 Mg Tabs (Buspirone hcl) .Marland Kitchen... Take 1 tablet by  mouth two times a day  Problem # 3:  ESSENTIAL HYPERTENSION (ICD-401.9) Assessment: Unchanged  Her updated medication list for this problem includes:    Amlodipine Besylate 10 Mg Tabs (Amlodipine besylate) .Marland Kitchen... Take 1 tablet by mouth once a day  BP today: 126/80 Prior BP: 120/78 (07/19/2010)  Labs Reviewed: K+: 3.9 (07/19/2010) Creat: : 0.77 (07/19/2010)   Chol: 173 (01/02/2007)   HDL: 63 (01/02/2007)   LDL: 97 (01/02/2007)   TG: 67 (01/02/2007)  Complete Medication List: 1)  Amlodipine Besylate 10 Mg Tabs (Amlodipine besylate) .... Take 1 tablet by mouth once a day 2)  Zyrtec Hives Relief 10 Mg Tabs (Cetirizine hcl) .... Take 1 tablet by mouth once a day 3)  Xanax 0.5 Mg Tabs (Alprazolam) .... Take 1 tablet by mouth three times a day 4)  Ventolin Hfa 108 (90 Base) Mcg/act Aers (Albuterol sulfate) .... 2 puffs two times a day 5)  Klor-con M20 20 Meq Cr-tabs (Potassium chloride crys cr) .... One tab by mouth three times a day 6)  B-100 Complex Tabs (Vitamins-lipotropics) .... Take 1 tablet by mouth once a day 7)  Albuterol Sulfate (2.5 Mg/7ml) 0.083% Nebu (Albuterol sulfate) .... Use on vial per nebulizer every 6 hours as needed for wheezing 8)  Ipratropium Bromide 0.02 % Soln (Ipratropium bromide) .... Use one vial per nebulizer every 6 hours as needed for wheezing 9)  Buspirone Hcl 7.5 Mg Tabs (Buspirone hcl) .... Take 1 tablet by mouth two times a day  Patient Instructions: 1)  Please schedule a follow-up appointment in 4 months. 2)  You are doing exceptionally well physicall, I suggest you go back to therapy and mental health, this will help you alot.Marland Kitchen 3)  I suggest you start tapering the xanax, take 2 per day for the next week, then one per day, then one per day for the next week then stop, . Pls start buspirone 1 twice daily, this is a better med for chronic anxiety, no addiction potential  4)  You willget info for help through the county with your meds Prescriptions: BUSPIRONE  HCL 7.5 MG TABS (BUSPIRONE HCL) Take 1 tablet by mouth two times a day  #60 x 3   Entered and Authorized by:   Syliva Overman MD   Signed by:   Syliva Overman MD on 08/30/2010   Method used:   Electronically to        [facility] [address] [phone]* (retail)       [address]       [place], [place]  [address]       Ph: [phone]       Fax: [phone]   RxID:   [id number]    Orders Added: 1)  Est. Patient Level IV [38756]

## 2010-12-04 NOTE — Progress Notes (Signed)
  Phone Note From Other Clinic   Caller: [facility] Summary of Call: [facility] called, discharged patient, patient is never home, cant get to her to do visits, patient is clearly not homebound, so they will not be going out to see her anymore Initial call taken by: Adella Hare LPN,  Mar 06, 1609 4:52 PM  Follow-up for Phone Call        notify the pt that she is being d/c d from [facility] due to never home, if she has  valid reasons for not being home eg doc's appts then we will need to get back with [facility] and try to resume their help   Follow-up by: Syliva Overman MD,  Mar 06, 2010 4:55 PM  Additional Follow-up for Phone Call Additional follow up Details #1::        patient states she stayed with boyfriend on the weekend and had a doctors appt, states they never give her a time that they will come and always call at the last minute, called [facility], a manager will reopen her services but if patient misses one visit she will be discharged, advised patient to make sure she is communicating with [facility] and calling if she is not going to be there Additional Follow-up by: Adella Hare LPN,  Mar 08, 9603 11:37 AM

## 2010-12-04 NOTE — Progress Notes (Signed)
Summary: CALL  Phone Note Call from Patient   Summary of Call: SHE RECIEVED YOUR MESSAGE AND STILL HAS NOT HEARD ANYTHING YET Initial call taken by: Lind Guest,  May 03, 2010 1:29 PM  Follow-up for Phone Call        i guess this is the disability Follow-up by: Syliva Overman MD,  May 03, 2010 2:46 PM

## 2010-12-04 NOTE — Miscellaneous (Signed)
Summary: PT order  Clinical Lists Changes  Orders: Added new Test order of INR/PT-[facility] (01093) - Signed

## 2010-12-04 NOTE — Assessment & Plan Note (Signed)
Summary: OV   Vital Signs:  Patient profile:   40 year old female Menstrual status:  regular Height:      63 inches Weight:      178 pounds BMI:     31.65 O2 Sat:      97 % Pulse rate:   72 / minute Pulse rhythm:   regular Resp:     16 per minute BP sitting:   100 / 80  Vitals Entered By: Everitt Amber LPN (March 01, 2010 10:19 AM)  Nutrition Counseling: [name] BMI is greater than 25 and therefore counseled on weight management options.  Primary Care Provider:  Syliva Overman MD   History of Present Illness: Pt recently d/c from the hospital from geodon overdose, asd she also required medical admission becuase of cardiopulmonary insatbility, she had also become non responsive. She insists that this was by no means intentional, and she is the calmest that I have seen her, she does have a psychiatrist who will follow her. She also state that her neurosurgeon aswell as the neurologist expressed distress that she still ahd not started getting disability benefits and vowed to help in anyway to speed the process. She had some of her BP meds held in the hospital also and would like this to be sorted out at thistime.   Current Medications (verified): 1)  Amlodipine Besylate 10 Mg Tabs (Amlodipine Besylate) .... Take 1 Tablet By Mouth Once A Day 2)  Clonidine Hcl 0.3 Mg Tabs (Clonidine Hcl) .... One and A Half At Bedtime 3)  Zyrtec Hives Relief 10 Mg Tabs (Cetirizine Hcl) .... Take 1 Tablet By Mouth Once A Day 4)  Xanax 0.5 Mg Tabs (Alprazolam) .... Take 1 Tablet By Mouth Three Times A Day 5)  Ferrous Sulfate 325 (65 Fe) Mg Tbec (Ferrous Sulfate) .... Take 1 Tablet By Mouth Once A Day 6)  Oxycodone Hcl 10 Mg Tabs (Oxycodone Hcl) .... Take 1 Tab Every 6 Hrs As Needed 7)  Warfarin Sodium 5 Mg Tabs (Warfarin Sodium) .... Uad 8)  Ventolin Hfa 108 (90 Base) Mcg/act Aers (Albuterol Sulfate) .... 2 Puffs Two Times A Day 9)  Abilify 2 Mg Tabs (Aripiprazole) .... Take 1 Tablet By Mouth Once A  Day 10)  Prozac 20 Mg Caps (Fluoxetine Hcl) .... 3 Tabs Q Am 11)  Furosemide 20 Mg Tabs (Furosemide) .... Take 1 Tablet By Mouth Once A Day As Needed 12)  Klor-Con M20 20 Meq Cr-Tabs (Potassium Chloride Crys Cr) .... Take 1 Tablet By Mouth Once A Day As Needed With Lasix 13)  B-100 Complex  Tabs (Vitamins-Lipotropics) .... Take 1 Tablet By Mouth Once A Day 14)  Miralax  Powd (Polyethylene Glycol 3350) .Marland Kitchen.. 17gm Daily 15)  Geodon 20 Mg Caps (Ziprasidone Hcl) .... Take 1 Tablet By Mouth Two Times A Day  Allergies (verified): No Known Drug Allergies  Review of Systems      See HPI General:  Complains of fatigue, malaise, sleep disorder, sweats, weakness, and weight loss; denies chills and fever. Eyes:  Denies discharge, eye pain, and red eye. ENT:  Denies earache, hoarseness, nasal congestion, and sinus pressure. CV:  Denies chest pain or discomfort, difficulty breathing while lying down, fatigue, palpitations, and swelling of feet. Resp:  Denies cough and sputum productive. GI:  Complains of constipation; denies abdominal pain, diarrhea, nausea, and vomiting; requesting specific stool softener which she had in hospital, sgtates it worked well. GU:  Denies dysuria and urinary frequency. MS:  Complains of joint  pain, low back pain, mid back pain, and stiffness. Derm:  Denies itching and rash. Neuro:  Complains of headaches and poor balance; denies seizures and sensation of room spinning. Psych:  Complains of anxiety, depression, easily angered, easily tearful, irritability, mental problems, panic attacks, sense of great danger, and unusual visions or sounds. Endo:  Denies excessive hunger, excessive thirst, and excessive urination. Heme:  Denies abnormal bruising and bleeding; continues to be maintained on coumadon for pE fololowing back surgery. Allergy:  Complains of seasonal allergies; denies hives or rash and itching eyes.   Impression & Recommendations:  Problem # 1:  DVT  (ICD-453.40) Assessment Comment Only pt to continue on coumadin for 6 months post DVT total  Problem # 2:  ESSENTIAL HYPERTENSION (ICD-401.9) Assessment: Comment Only  The following medications were removed from the medication list:    Clonidine Hcl 0.3 Mg Tabs (Clonidine hcl) ..... One and a half at bedtime Her updated medication list for this problem includes:    Amlodipine Besylate 10 Mg Tabs (Amlodipine besylate) .Marland Kitchen... Take 1 tablet by mouth once a day    Furosemide 20 Mg Tabs (Furosemide) .Marland Kitchen... Take 1 tablet by mouth once a day as needed    Clonidine Hcl 0.2 Mg Tabs (Clonidine hcl) .Marland Kitchen... Take 1 tablet by mouth once a day  Orders: T-Basic Metabolic Panel [id number])  BP today: 100/80 Prior BP: 110/60 (02/07/2010)  Labs Reviewed: K+: 4.1 (01/02/2007) Creat: : 0.72 (01/02/2007)   Chol: 173 (01/02/2007)   HDL: 63 (01/02/2007)   LDL: 97 (01/02/2007)   TG: 67 (01/02/2007)  Problem # 3:  DEPRESSION, CHRONIC (ICD-311) Assessment: Unchanged  The following medications were removed from the medication list:    Vistaril 25 Mg Caps (Hydroxyzine pamoate) .Marland Kitchen... Take 1 capsule by mouth three times a day as needed    Diazepam 5 Mg Tabs (Diazepam) ..... One tab q 8 hrs as needed Her updated medication list for this problem includes:    Xanax 0.5 Mg Tabs (Alprazolam) .Marland Kitchen... 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 2 puffs two times a day    Duoneb 0.5-2.5 (3) Mg/37ml Soln (Ipratropium-albuterol) ..... Use every 6 hrs as needed for wheezing  Complete Medication List: 1)   Amlodipine Besylate 10 Mg Tabs (Amlodipine besylate) .... Take 1 tablet by mouth once a day 2)  Zyrtec Hives Relief 10 Mg Tabs (Cetirizine hcl) .... Take 1 tablet by mouth once a day 3)  Xanax 0.5 Mg Tabs (Alprazolam) .... Take 1 tablet by mouth three times a day 4)  Ferrous Sulfate 325 (65 Fe) Mg Tbec (Ferrous sulfate) .... Take 1 tablet by mouth once a day 5)  Oxycodone Hcl 10 Mg Tabs (Oxycodone hcl) .... Take 1 tab every 6 hrs as needed 6)  Warfarin Sodium 5 Mg Tabs (Warfarin sodium) .... Uad 7)  Ventolin Hfa 108 (90 Base) Mcg/act Aers (Albuterol sulfate) .... 2 puffs two times a day 8)  Abilify 2 Mg Tabs (Aripiprazole) .... Take 1 tablet by mouth once a day 9)  Prozac 20 Mg Caps (Fluoxetine hcl) .... 3 tabs q  am 10)  Furosemide 20 Mg Tabs (Furosemide) .... Take 1 tablet by mouth once a day as needed 11)  Klor-con M20 20 Meq Cr-tabs (Potassium chloride crys cr) .... Take 1 tablet by mouth once a day as needed with lasix 12)  B-100 Complex Tabs (Vitamins-lipotropics) .... Take 1 tablet by mouth once a day 13)  Miralax Powd (Polyethylene glycol 3350) .Marland Kitchen.. 17gm daily 14)  Geodon 20 Mg Caps (Ziprasidone hcl) .... Take 1 tablet by mouth two times a day 15)  Senokot S 8.6-50 Mg Tabs (Sennosides-docusate sodium) .... Take 1 tablet by mouth two times a day 16)  Clonidine Hcl 0.2 Mg Tabs (Clonidine hcl) .... Take 1 tablet by mouth once a day 17)  Duoneb 0.5-2.5 (3) Mg/42ml Soln (Ipratropium-albuterol) .... Use every 6 hrs as needed for wheezing  Other Orders: INR/PT-[facility] (54098)  Patient Instructions: 1)  Please schedule a follow-up appointment in 1 month. 2)  Stop clonidine 0.3mg , and start clonidine 0.2mg   one daily. 3)  PT/INR stat and chem 7 stat today Prescriptions: AMLODIPINE BESYLATE 10 MG TABS (AMLODIPINE BESYLATE) Take 1 tablet by mouth once a day  #30 x 5   Entered by:   Everitt Amber LPN   Authorized by:   Syliva Overman MD   Signed by:   Everitt Amber LPN on 11/91/4782   Method used:    Electronically to        [facility] [address] [phone]* (retail)       [address]       [place], [place]  [address]       Ph: [phone]       Fax: [phone]   RxID:   [id number] DUONEB 0.5-2.5 (3) MG/3ML SOLN (IPRATROPIUM-ALBUTEROL) use every 6 hrs as needed for wheezing  #2month x 3   Entered by:   Everitt Amber LPN   Authorized by:   Syliva Overman MD   Signed by:   Everitt Amber LPN on 95/63/8756   Method used:   Electronically to        [facility] [phone]* (retail)       [address]       [place], [place]  [address]       Ph: [phone]       Fax: [phone]   RxID:   [id number] CLONIDINE HCL 0.2 MG TABS (CLONIDINE HCL) Take 1 tablet by mouth once a day  #30 x 2   Entered and Authorized by:   Syliva Overman MD   Signed by:   Syliva Overman MD on 03/01/2010   Method used:   Printed then faxed to ...       [facility]  [address] [phone]* (retail)       [address]       [place], [place]  [address]       Ph: [phone]       Fax: [phone]   RxID:   [id number] SENOKOT S 8.6-50 MG TABS (SENNOSIDES-DOCUSATE SODIUM) Take 1 tablet by mouth two times a day  #60 x 3   Entered and Authorized by:   Syliva Overman MD   Signed by:   Syliva Overman MD on 03/01/2010   Method used:   Electronically to        [facility] [phone]* (retail)       [address]       [place], [place]  [address]       Ph: [phone]  Fax: [phone]   RxID:   [id number]

## 2010-12-04 NOTE — Assessment & Plan Note (Signed)
Summary: f up   Vital Signs:  Patient profile:   40 year old female Menstrual status:  regular Height:      63 inches Weight:      162 pounds BMI:     28.80 O2 Sat:      97 % on Room air Pulse rate:   71 / minute Pulse rhythm:   regular Resp:     16 per minute BP sitting:   110 / 74  (left arm)  Vitals Entered By: Adella Hare LPN (May 08, 1609 4:18 PM)  Nutrition Counseling: [name] BMI is greater than 25 and therefore counseled on weight management options.  O2 Flow:  Room air CC: follow-up visit Is Patient Diabetic? No Pain Assessment Patient in pain? yes     Location: back Intensity: 8 Type: aching Onset of pain  Chronic   Primary Care Provider:  Syliva Overman MD  CC:  follow-up visit.  History of Present Illness: pt continuies to experience severe depression, having been unable to work beacuse of physical problems following back surgery, and still not qualifying for disability. She reports no income, and care and concern from 1 of 3 children, and 1 friend.She often feels suicidal, denies feeling this way now, and does not desire in pt careat this time. She doesd have a therapist.onew of her sons is an additional cause pof stress, because he isdoing things that he should not be, and she states her daughter who recently graduated from high school no longer wants to go to college. She is overwhelmed and depressed most of the time. She denies any other problemsexcept for continued back pain with lower ext weakness and unsteady gait.   Current Medications (verified): 1)  Amlodipine Besylate 10 Mg Tabs (Amlodipine Besylate) .... Take 1 Tablet By Mouth Once A Day 2)  Zyrtec Hives Relief 10 Mg Tabs (Cetirizine Hcl) .... Take 1 Tablet By Mouth Once A Day 3)  Xanax 0.5 Mg Tabs (Alprazolam) .... Take 1 Tablet By Mouth Three Times A Day 4)  Ferrous Sulfate 325 (65 Fe) Mg Tbec (Ferrous Sulfate) .... Take 1 Tablet By Mouth Once A Day 5)  Oxycodone Hcl 10 Mg Tabs (Oxycodone  Hcl) .... Take 1 Tab Every 6 Hrs As Needed 6)  Ventolin Hfa 108 (90 Base) Mcg/act Aers (Albuterol Sulfate) .... 2 Puffs Two Times A Day 7)  Prozac 20 Mg Caps (Fluoxetine Hcl) .... 3 Tabs Q Am 8)  Furosemide 20 Mg Tabs (Furosemide) .... Take 1 Tablet By Mouth Once A Day As Needed 9)  Klor-Con M20 20 Meq Cr-Tabs (Potassium Chloride Crys Cr) .... One Tab By Mouth Three Times A Day 10)  B-100 Complex  Tabs (Vitamins-Lipotropics) .... Take 1 Tablet By Mouth Once A Day 11)  Miralax  Powd (Polyethylene Glycol 3350) .Marland Kitchen.. 17gm Daily 12)  Geodon 20 Mg Caps (Ziprasidone Hcl) .... 2 in The Morning, and 1 At Bedtime 13)  Senokot S 8.6-50 Mg Tabs (Sennosides-Docusate Sodium) .... Take 1 Tablet By Mouth Two Times A Day 14)  Clonidine Hcl 0.2 Mg Tabs (Clonidine Hcl) .... Take 1 Tablet By Mouth Once A Day 15)  Albuterol Sulfate (2.5 Mg/59ml) 0.083% Nebu (Albuterol Sulfate) .... Use On Vial Per Nebulizer Every 6 Hours As Needed For Wheezing 16)  Ipratropium Bromide 0.02 % Soln (Ipratropium Bromide) .... Use One Vial Per Nebulizer Every 6 Hours As Needed For Wheezing 17)  Cromolyn Sodium 4 % Soln (Cromolyn Sodium) .... One To Two Drops To Affected Eyes  Four Times Daily As Needed  Allergies (verified): No Known Drug Allergies  Past History:  Past Medical History: Current Problems:  MIGRAINE HEADACHE (ICD-346.90) BACK PAIN, ACUTE (ICD-724.5) OBESITY, UNSPECIFIED (ICD-278.00) ESSENTIAL HYPERTENSION (ICD-401.9) DVT in 2010  Past Surgical History: Appendectomy Carpal tunnel release  Tubal ligation lumbar spine surgery x 2 in 2010  Review of Systems      See HPI General:  Complains of fatigue; denies chills and fever. Eyes:  Denies blurring and discharge. ENT:  Denies nasal congestion, postnasal drainage, sinus pressure, and sore throat. CV:  Denies chest pain or discomfort, palpitations, and swelling of feet. Resp:  Denies cough and sputum productive. GI:  Denies abdominal pain, constipation,  diarrhea, nausea, and vomiting. GU:  Denies dysuria and urinary frequency. MS:  Complains of joint pain, low back pain, mid back pain, muscle weakness, and stiffness. Neuro:  Complains of headaches, memory loss, poor balance, and weakness; denies seizures and sensation of room spinning. Psych:  Complains of anxiety, depression, easily angered, easily tearful, irritability, mental problems, suicidal thoughts/plans, and unusual visions or sounds; denies thoughts of violence. Endo:  Denies excessive thirst and excessive urination. Heme:  Denies abnormal bruising and bleeding. Allergy:  Denies hives or rash and itching eyes.  Physical Exam  General:  Well-developed,well-nourished,in no acute distress; alert,. poor eye contact , flat affect. HEENT: No facial asymmetry,  EOMI, No sinus tenderness, TM's Clear, oropharynx  pink and moist.   Chest: Clear to auscultation bilaterally.  CVS: S1, S2, No murmurs, No S3.   Abd: Soft, Nontender.  MS: decreased  ROM spine, adequate in  hips, shoulders and knees.  Ext: No edema.   CNS: CN 2-12 intact, reduced power tone and sensation ion lower ext  Skin: Intact, no visible lesions or rashes.  Psych: .  Memory intact,r depressed appearing.    Impression & Recommendations:  Problem # 1:  ALLERGIC RHINITIS CAUSE UNSPECIFIED (ICD-477.9) Assessment Improved  Her updated medication list for this problem includes:    Zyrtec Hives Relief 10 Mg Tabs (Cetirizine hcl) .Marland Kitchen... Take 1 tablet by mouth once a day  Problem # 2:  DEPRESSION, CHRONIC (ICD-311) Assessment: Unchanged  Her updated medication list for this problem includes:    Xanax 0.5 Mg Tabs (Alprazolam) .Marland Kitchen... Take 1 tablet by mouth three times a day    Prozac 20 Mg Caps (Fluoxetine hcl) .Marland KitchenMarland KitchenMarland KitchenMarland Kitchen 3 tabs q am  Problem # 3:  ESSENTIAL HYPERTENSION (ICD-401.9) Assessment: Improved  Her updated medication list for this problem includes:    Amlodipine Besylate 10 Mg Tabs (Amlodipine besylate) .Marland Kitchen...  Take 1 tablet by mouth once a day    Furosemide 20 Mg Tabs (Furosemide) .Marland Kitchen... Take 1 tablet by mouth once a day as needed    Clonidine Hcl 0.2 Mg Tabs (Clonidine hcl) .Marland Kitchen... Take 1 tablet by mouth once a day  Orders: T-Basic Metabolic Panel [id number])  BP today: 110/74 Prior BP: 128/82 (04/11/2010)  Labs Reviewed: K+: 3.8 (03/01/2010) Creat: : 0.88 (03/01/2010)   Chol: 173 (01/02/2007)   HDL: 63 (01/02/2007)   LDL: 97 (01/02/2007)   TG: 67 (01/02/2007)  Problem # 4:  OBESITY, UNSPECIFIED (ICD-278.00)  Ht: 63 (05/08/2010)   Wt: 162 (05/08/2010)   BMI: 28.80 (05/08/2010)  Complete Medication List: 1)  Amlodipine Besylate 10 Mg Tabs (Amlodipine besylate) .... Take 1 tablet by mouth once a day 2)  Zyrtec Hives Relief 10 Mg Tabs (Cetirizine hcl) .... Take 1 tablet by mouth once a day 3)  Xanax 0.5 Mg  Tabs (Alprazolam) .... Take 1 tablet by mouth three times a day 4)  Ferrous Sulfate 325 (65 Fe) Mg Tbec (Ferrous sulfate) .... Take 1 tablet by mouth once a day 5)  Oxycodone Hcl 10 Mg Tabs (Oxycodone hcl) .... Take 1 tab every 6 hrs as needed 6)  Ventolin Hfa 108 (90 Base) Mcg/act Aers (Albuterol sulfate) .... 2 puffs two times a day 7)  Prozac 20 Mg Caps (Fluoxetine hcl) .... 3 tabs q am 8)  Furosemide 20 Mg Tabs (Furosemide) .... Take 1 tablet by mouth once a day as needed 9)  Klor-con M20 20 Meq Cr-tabs (Potassium chloride crys cr) .... One tab by mouth three times a day 10)  B-100 Complex Tabs (Vitamins-lipotropics) .... Take 1 tablet by mouth once a day 11)  Miralax Powd (Polyethylene glycol 3350) .Marland Kitchen.. 17gm daily 12)  Geodon 20 Mg Caps (Ziprasidone hcl) .... 2 in the morning, and 1 at bedtime 13)  Senokot S 8.6-50 Mg Tabs (Sennosides-docusate sodium) .... Take 1 tablet by mouth two times a day 14)  Clonidine Hcl 0.2 Mg Tabs (Clonidine hcl) .... Take 1 tablet by mouth once a day 15)  Albuterol Sulfate (2.5 Mg/47ml) 0.083% Nebu (Albuterol sulfate) .... Use on vial per nebulizer every 6  hours as needed for wheezing 16)  Ipratropium Bromide 0.02 % Soln (Ipratropium bromide) .... Use one vial per nebulizer every 6 hours as needed for wheezing 17)  Cromolyn Sodium 4 % Soln (Cromolyn sodium) .... One to two drops to affected eyes  four times daily as needed 18)  Doxycycline Hyclate 100 Mg Caps (Doxycycline hyclate) .... Take 1 capsule by mouth two times a day 19)  Fluconazole 150 Mg Tabs (Fluconazole) .... Take 1 tablet by mouth once a day as needed 20)  Bactroban 2 % Oint (Mupirocin) .... Apply twice daily as neededfor 1 week  Other Orders: T-CBC w/Diff ([id number]) T-Lipid Profile ([id number]) T-TSH [phone])  Patient Instructions: 1)  CPE in 4 to 5 weeks 2)  Fasting labs in 4 weeks. 3)  pls consider all the good things in your life and keep on believing that better days are ahead. Prescriptions: BACTROBAN 2 % OINT (MUPIROCIN) apply twice daily as neededfor 1 week  #30 gm x 0   Entered and Authorized by:   Syliva Overman MD   Signed by:   Syliva Overman MD on 05/08/2010   Method used:   Electronically to        [facility] [address] [phone]* (retail)       [address]       [place], [place]  [address]       Ph: [phone]       Fax: [phone]   RxID:   [id number] FLUCONAZOLE 150 MG TABS (FLUCONAZOLE) Take 1 tablet by mouth once a day as needed  #2 x 0   Entered and Authorized by:   Syliva Overman MD   Signed by:   Syliva Overman MD on 05/08/2010   Method used:   Electronically to        [facility] [address] [phone]* (retail)       [address]       [place], [place]  [address]       Ph: [phone]       Fax: [phone]   RxID:   [id number] DOXYCYCLINE HYCLATE 100 MG CAPS (DOXYCYCLINE HYCLATE) Take 1 capsule by mouth two times a day  #14 x 0   Entered and Authorized  by:   Syliva Overman MD   Signed by:   Syliva Overman MD on 05/08/2010   Method used:   Electronically to        [facility] [address] [phone]* (retail)       [address]       [place], [place]  [address]       Ph: [phone]       Fax: [phone]   RxID:   [id number]

## 2010-12-04 NOTE — Progress Notes (Signed)
Summary: lab work  Phone Note Call from Patient   Summary of Call: pt needs to speak with jamie about getting her lab work done on checking [place]. pt states she still can't drive. please call her at [phone] Initial call taken by: Rudene Anda,  December 12, 2009 9:01 AM  Follow-up for Phone Call        returned call, unavailable Follow-up by: Worthy Keeler LPN,  December 12, 2009 4:49 PM  Additional Follow-up for Phone Call Additional follow up Details #1::        needs lab order faxed to [facility] order sent Additional Follow-up by: Worthy Keeler LPN,  December 13, 2009 9:19 AM

## 2010-12-04 NOTE — Progress Notes (Signed)
Summary: MEDICINE  Phone Note Call from Patient   Summary of Call: NEEDS HER ZYRTEC CALLED IN AT [facility] [address] Initial call taken by: Lind Guest,  May 04, 2010 10:36 AM  Follow-up for Phone Call        Rx Called In Follow-up by: Adella Hare LPN,  May 05, 8755 10:39 AM    Prescriptions: ZYRTEC HIVES RELIEF 10 MG TABS (CETIRIZINE HCL) Take 1 tablet by mouth once a day  #30 x 0   Entered by:   Adella Hare LPN   Authorized by:   Syliva Overman MD   Signed by:   Adella Hare LPN on 43/32/9518   Method used:   Electronically to        [facility] [address] [phone]* (retail)       [address]       [place], [place]  [address]       Ph: [phone]       Fax: [phone]   RxID:   [id number]

## 2010-12-04 NOTE — Letter (Signed)
Summary: TB Skin Test  All     ,     Phone:   Fax:           TB Skin Test    Denesha HARRIS    Date TB Test Placed: Left forearm 03/27/2010  TB Test Placed by:  Cherylynn Ridges, LPN  Lot #:  [id number]        Expiration Date: 11/2011  Date TB Test Read:  03/29/2010   Result  0 MM  TB Test Read by:  Everitt Amber, LPN

## 2010-12-04 NOTE — Miscellaneous (Signed)
Summary: Home Care Report  Home Care Report   Imported By: Lind Guest 03/26/2010 08:36:18  _____________________________________________________________________  External Attachment:    Type:   Image     Comment:   External Document

## 2010-12-04 NOTE — Letter (Signed)
Summary: pharmacy  pharmacy   Imported By: Lind Guest 06/14/2010 14:25:44  _____________________________________________________________________  External Attachment:    Type:   Image     Comment:   External Document

## 2010-12-04 NOTE — Assessment & Plan Note (Signed)
Summary: F UP   Vital Signs:  Patient profile:   40 year old female Menstrual status:  regular Height:      63 inches Weight:      171.25 pounds BMI:     30.45 O2 Sat:      97 % Pulse rate:   86 / minute Pulse rhythm:   regular Resp:     16 per minute BP sitting:   110 / 80  (right arm) Cuff size:   large  Vitals Entered By: Everitt Amber LPN (December 20, 2009 3:54 PM)  Nutrition Counseling: [name] BMI is greater than 25 and therefore counseled on weight management options. CC: back and having brusing in on both legs Is Patient Diabetic? No   Primary Care Provider:  Syliva Overman MD  CC:  back and having brusing in on both legs.  History of Present Illness: Reports  that she is not doing well. She continues to be depressed, has no ioncome, trying to get disability, though she has support from family and friend , she states she continues to be irritable and mean to them. She is not suicidal or homicidal. She stays anxious.Has appt end of March for mental health wants med before that Denies recent fever or chills. Denies sinus pressure, nasal congestion , ear pain or sore throat. Denies chest congestion, or cough productive of sputum. Denies chest pain, palpitations, PND, orthopnea or leg swelling. Denies abdominal pain, nausea, vomitting, diarrhea or constipation. Denies change in bowel movements or bloody stool. Denies dysuria , frequency, incontinence or hesitancy. Continued back and lower ext pain Denies headaches, vertigo, seizures. Concerned about bruising, I explained this is because she is on a blood thinner, and th bruises do not represent blood clots     Current Medications (verified): 1)  Loratadine 10 Mg Tabs (Loratadine) .... One Tab By Mouth Qd 2)  Amlodipine Besylate 10 Mg Tabs (Amlodipine Besylate) .... Take 1 Tablet By Mouth Once A Day 3)  Clonidine Hcl 0.3 Mg Tabs (Clonidine Hcl) .... One and A Half At Bedtime 4)  Zyrtec Hives Relief 10 Mg Tabs  (Cetirizine Hcl) .... Take 1 Tablet By Mouth Once A Day 5)  Xanax 0.5 Mg Tabs (Alprazolam) .... Take 1 Tablet By Mouth Three Times A Day 6)  Ferrous Sulfate 325 (65 Fe) Mg Tbec (Ferrous Sulfate) .... Take 1 Tablet By Mouth Once A Day 7)  Oxycodone Hcl 10 Mg Tabs (Oxycodone Hcl) .... Take 1 Tab Every 6 Hrs As Needed 8)  Vicodin 5-500 Mg Tabs (Hydrocodone-Acetaminophen) .... One Tab Q 4 Hrs As Needed 9)  Vistaril 25 Mg Caps (Hydroxyzine Pamoate) .... Take 1 Capsule By Mouth Three Times A Day As Needed 10)  Gabapentin 300 Mg Caps (Gabapentin) .... One Cap By Mouth Bid 11)  Warfarin Sodium 2 Mg Tabs (Warfarin Sodium) .... Uad 12)  Warfarin Sodium 5 Mg Tabs (Warfarin Sodium) .... Uad 13)  Diazepam 5 Mg Tabs (Diazepam) .... One Tab Q 8 Hrs As Needed  Allergies (verified): No Known Drug Allergies  Review of Systems      See HPI Eyes:  Denies blurring and discharge. Heme:  Complains of abnormal bruising. Allergy:  Denies hives or rash.  Physical Exam  General:  Well-developed,well-nourished,in no acute distress; alert,appropriate and cooperative throughout examination. Pt sless agitated withgood eye contact. HEENT: No facial asymmetry,  EOMI, frontal and maxillary sinus tenderness, TM's Clear, oropharynx  pink and moist.   Chest: adequate air entry, bilateral crackles CVS: S1, S2,  No murmurs, No S3.   Abd: Soft, Nontender.  ZO:XWRUEAVWU ROM spine,adequate in  hips, shoulders and knees.  Ext: No edema.   CNS: CN 2-12 intact,reduced  power tone and sensation in right upper and lower ext  Skin: Intact, no visible lesions or rashes.Mild bruising noted  Psych: Good eye contact, normal affect.  Memory intact,less anxious and depressed appearing.    Impression & Recommendations:  Problem # 1:  COUMADIN THERAPY (ICD-V58.61) Assessment Comment Only iNR vtestred and is 1.9, no change in med dose rept March9  Problem # 2:  DEPRESSION, CHRONIC (ICD-311) Assessment: Unchanged  Her updated  medication list for this problem includes:    Xanax 0.5 Mg Tabs (Alprazolam) .Marland Kitchen... Take 1 tablet by mouth three times a day    Vistaril 25 Mg Caps (Hydroxyzine pamoate) .Marland Kitchen... Take 1 capsule by mouth three times a day as needed    Diazepam 5 Mg Tabs (Diazepam) ..... One tab q 8 hrs as needed    Lexapro 10 Mg Tabs (Escitalopram oxalate) .Marland Kitchen... Take 1 tablet by mouth once a day  Problem # 3:  ESSENTIAL HYPERTENSION (ICD-401.9) Assessment: Unchanged  The following medications were removed from the medication list:    Amlodipine Besylate 10 Mg Tabs (Amlodipine besylate) .Marland Kitchen... Take 1 tablet by mouth once a day Her updated medication list for this problem includes:    Amlodipine Besylate 10 Mg Tabs (Amlodipine besylate) .Marland Kitchen... Take 1 tablet by mouth once a day    Clonidine Hcl 0.3 Mg Tabs (Clonidine hcl) ..... One and a half at bedtime  BP today: 110/80 Prior BP: 110/60 (11/16/2009)  Labs Reviewed: K+: 4.1 (01/02/2007) Creat: : 0.72 (01/02/2007)   Chol: 173 (01/02/2007)   HDL: 63 (01/02/2007)   LDL: 97 (01/02/2007)   TG: 67 (01/02/2007)  Complete Medication List: 1)  Loratadine 10 Mg Tabs (Loratadine) .... One tab by mouth qd 2)  Amlodipine Besylate 10 Mg Tabs (Amlodipine besylate) .... Take 1 tablet by mouth once a day 3)  Clonidine Hcl 0.3 Mg Tabs (Clonidine hcl) .... One and a half at bedtime 4)  Zyrtec Hives Relief 10 Mg Tabs (Cetirizine hcl) .... Take 1 tablet by mouth once a day 5)  Xanax 0.5 Mg Tabs (Alprazolam) .... Take 1 tablet by mouth three times a day 6)  Ferrous Sulfate 325 (65 Fe) Mg Tbec (Ferrous sulfate) .... Take 1 tablet by mouth once a day 7)  Oxycodone Hcl 10 Mg Tabs (Oxycodone hcl) .... Take 1 tab every 6 hrs as needed 8)  Vicodin 5-500 Mg Tabs (Hydrocodone-acetaminophen) .... One tab q 4 hrs as needed 9)  Vistaril 25 Mg Caps (Hydroxyzine pamoate) .... Take 1 capsule by mouth three times a day as needed 10)  Gabapentin 300 Mg Caps (Gabapentin) .... One cap by mouth  bid 11)  Warfarin Sodium 2 Mg Tabs (Warfarin sodium) .... Uad 12)  Warfarin Sodium 5 Mg Tabs (Warfarin sodium) .... Uad 13)  Diazepam 5 Mg Tabs (Diazepam) .... One tab q 8 hrs as needed 14)  Lexapro 10 Mg Tabs (Escitalopram oxalate) .... Take 1 tablet by mouth once a day  Other Orders: INR/PT-[facility] ([id number])  Patient Instructions: 1)  F/U in 5 weeks. 2)  i am very happy that your BP is great. 3)  You need to go to the therapist . They will refer you  to psychiatry. Prescriptions: WARFARIN SODIUM 5 MG TABS (WARFARIN SODIUM) uad  #60 x 2   Entered by:   Adella Hare LPN  Authorized by:   Syliva Overman MD   Signed by:   Adella Hare LPN on 16/08/9603   Method used:   Electronically to        [facility] [phone]* (retail)       [address]       [place], [place]  [address]       Ph: [phone]       Fax: [phone]   RxID:   [id number] WARFARIN SODIUM 2 MG TABS (WARFARIN SODIUM) uad  #60 x 2   Entered by:   Adella Hare LPN   Authorized by:   Syliva Overman MD   Signed by:   Adella Hare LPN on 25/36/6440   Method used:   Electronically to        [facility] [phone]* (retail)       [address]       [place], [place]  [address]       Ph: [phone]       Fax: [phone]   RxID:   [id number] ZYRTEC HIVES RELIEF 10 MG TABS (CETIRIZINE HCL) Take 1 tablet by mouth once a day  #30 x 3   Entered by:   Adella Hare LPN   Authorized by:   Syliva Overman MD   Signed by:   Adella Hare LPN on 20/25/4270   Method used:   Electronically to        [facility] [phone]* (retail)       [address]       [place], [place]  [address]       Ph: [phone]       Fax: [phone]   RxID:   [id number] CLONIDINE HCL 0.3 MG TABS (CLONIDINE HCL) one and a half at bedtime  #45 x 3   Entered by:   Adella Hare LPN   Authorized by:   Syliva Overman MD   Signed by:   Adella Hare LPN on 37/16/9678   Method used:   Electronically to        [facility] [phone]* (retail)       [address]       [place], [place]  [address]       Ph: [phone]       Fax: [phone]   RxID:   [id number] LEXAPRO 10 MG TABS (ESCITALOPRAM OXALATE) Take 1 tablet by mouth once a day  #30 x 2   Entered and Authorized by:   Syliva Overman MD   Signed by:   Syliva Overman MD on 12/20/2009   Method used:   Electronically to        [facility] [phone]* (retail)       [address]       [place], [place]  [address]       Ph: [phone]       Fax: [phone]   RxID:   [id number]

## 2010-12-04 NOTE — Letter (Signed)
Summary: Letter  Letter   Imported By: Lind Guest 02/21/2010 12:55:16  _____________________________________________________________________  External Attachment:    Type:   Image     Comment:   External Document

## 2010-12-04 NOTE — Progress Notes (Signed)
Summary: [facility]   Imported By: Lind Guest 04/27/2010 10:06:56  _____________________________________________________________________  External Attachment:    Type:   Image     Comment:   External Document

## 2010-12-04 NOTE — Assessment & Plan Note (Signed)
Summary: F UP   Vital Signs:  Patient profile:   40 year old female Menstrual status:  regular Height:      63 inches Weight:      171 pounds BMI:     30.40 O2 Sat:      98 % Pulse rate:   85 / minute Pulse rhythm:   regular Resp:     16 per minute BP sitting:   128 / 82  (left arm) Cuff size:   regular  Vitals Entered By: Everitt Amber LPN (April 11, 1609 1:47 PM)  Nutrition Counseling: [name] BMI is greater than 25 and therefore counseled on weight management options. CC: Follow up chronic problems   Primary Care Provider:  Syliva Overman MD  CC:  Follow up chronic problems.  History of Present Illness: Pt continues to face alot of mental and emotional challengesas she isstill awaiting disability, and has no income.She is seeing psychiatry, but likely needs more frequent visits. She denies current suicidal or homicidal ideation or hallucinations. She has completed a total of 6months of coumadin and will discontinue this drug effective today. She denies head or chest congestion. She denies chest tightness, palpitations, pND or orthopnea.  She continues to expperience significant lower extremity weakness which precludes her ability to work  Current Medications (verified): 1)  Amlodipine Besylate 10 Mg Tabs (Amlodipine Besylate) .... Take 1 Tablet By Mouth Once A Day 2)  Zyrtec Hives Relief 10 Mg Tabs (Cetirizine Hcl) .... Take 1 Tablet By Mouth Once A Day 3)  Xanax 0.5 Mg Tabs (Alprazolam) .... Take 1 Tablet By Mouth Three Times A Day 4)  Ferrous Sulfate 325 (65 Fe) Mg Tbec (Ferrous Sulfate) .... Take 1 Tablet By Mouth Once A Day 5)  Oxycodone Hcl 10 Mg Tabs (Oxycodone Hcl) .... Take 1 Tab Every 6 Hrs As Needed 6)  Warfarin Sodium 5 Mg Tabs (Warfarin Sodium) .... Uad 7)  Ventolin Hfa 108 (90 Base) Mcg/act Aers (Albuterol Sulfate) .... 2 Puffs Two Times A Day 8)  Prozac 20 Mg Caps (Fluoxetine Hcl) .... 3 Tabs Q Am 9)  Furosemide 20 Mg Tabs (Furosemide) .... Take 1 Tablet  By Mouth Once A Day As Needed 10)  Klor-Con M20 20 Meq Cr-Tabs (Potassium Chloride Crys Cr) .... One Tab By Mouth Three Times A Day 11)  B-100 Complex  Tabs (Vitamins-Lipotropics) .... Take 1 Tablet By Mouth Once A Day 12)  Miralax  Powd (Polyethylene Glycol 3350) .Marland Kitchen.. 17gm Daily 13)  Geodon 20 Mg Caps (Ziprasidone Hcl) .... 2 in The Morning, and 1 At Bedtime 14)  Senokot S 8.6-50 Mg Tabs (Sennosides-Docusate Sodium) .... Take 1 Tablet By Mouth Two Times A Day 15)  Clonidine Hcl 0.2 Mg Tabs (Clonidine Hcl) .... Take 1 Tablet By Mouth Once A Day 16)  Albuterol Sulfate (2.5 Mg/55ml) 0.083% Nebu (Albuterol Sulfate) .... Use On Vial Per Nebulizer Every 6 Hours As Needed For Wheezing 17)  Ipratropium Bromide 0.02 % Soln (Ipratropium Bromide) .... Use One Vial Per Nebulizer Every 6 Hours As Needed For Wheezing  Allergies (verified): No Known Drug Allergies  Review of Systems      See HPI General:  Complains of fatigue, loss of appetite, malaise, sleep disorder, and weight loss. Eyes:  Denies discharge and red eye. ENT:  Denies hoarseness, nasal congestion, sinus pressure, and sore throat. CV:  Denies chest pain or discomfort, palpitations, and swelling of feet. Resp:  Denies cough and sputum productive. GI:  Denies abdominal pain, constipation,  diarrhea, nausea, and vomiting. GU:  Denies dysuria and urinary frequency. MS:  Complains of low back pain, mid back pain, muscle weakness, and stiffness. Derm:  Denies itching and rash. Neuro:  Complains of disturbances in coordination, headaches, poor balance, and weakness. Psych:  Complains of anxiety, depression, easily tearful, irritability, and mental problems; denies suicidal thoughts/plans, thoughts of violence, and unusual visions or sounds. Endo:  Denies cold intolerance, excessive hunger, excessive thirst, excessive urination, heat intolerance, polyuria, and weight change. Heme:  Denies abnormal bruising and bleeding. Allergy:  Complains of  itching eyes; Itchy watery eyes in the past several weeks.  Physical Exam  General:  Well-developed,well-nourished,in no acute distress; alert,appropriate and cooperative throughout examination. poor eye contact , flat affect. HEENT: No facial asymmetry,  EOMI, No sinus tenderness, TM's Clear, oropharynx  pink and moist.   Chest: Clear to auscultation bilaterally.  CVS: S1, S2, No murmurs, No S3.   Abd: Soft, Nontender.  MS: Adequate ROM spine, hips, shoulders and knees.  Ext: No edema.   CNS: CN 2-12 intact, reduced power tone and sensation ion lower ext  Skin: Intact, no visible lesions or rashes.  Psych: .  Memory intact, r depressed appearing.    Impression & Recommendations:  Problem # 1:  PSYCHOTIC D/O W/HALLUCINATIONS CONDS CLASS ELSW (ICD-293.82) Assessment Improved  Problem # 2:  DVT (ICD-453.40) Assessment: Comment Only 6 months of treatment completed  Problem # 3:  ESSENTIAL HYPERTENSION (ICD-401.9) Assessment: Improved  Her updated medication list for this problem includes:    Amlodipine Besylate 10 Mg Tabs (Amlodipine besylate) .Marland Kitchen... Take 1 tablet by mouth once a day    Furosemide 20 Mg Tabs (Furosemide) .Marland Kitchen... Take 1 tablet by mouth once a day as needed    Clonidine Hcl 0.2 Mg Tabs (Clonidine hcl) .Marland Kitchen... Take 1 tablet by mouth once a day  BP today: 128/82 Prior BP: 100/80 (03/01/2010)  Labs Reviewed: K+: 3.8 (03/01/2010) Creat: : 0.88 (03/01/2010)   Chol: 173 (01/02/2007)   HDL: 63 (01/02/2007)   LDL: 97 (01/02/2007)   TG: 67 (01/02/2007)  Problem # 4:  OBESITY, UNSPECIFIED (ICD-278.00) Assessment: Improved  Ht: 63 (04/11/2010)   Wt: 171 (04/11/2010)   BMI: 30.40 (04/11/2010)  Problem # 5:  ALLERGIC RHINITIS CAUSE UNSPECIFIED (ICD-477.9) Assessment: Deteriorated  Her updated medication list for this problem includes:    Zyrtec Hives Relief 10 Mg Tabs (Cetirizine hcl) .Marland Kitchen... Take 1 tablet by mouth once a day  Discussed use of allergy medications and  environmental measures. eye drops also precribed for occular symptoms  Complete Medication List: 1)  Amlodipine Besylate 10 Mg Tabs (Amlodipine besylate) .... Take 1 tablet by mouth once a day 2)  Zyrtec Hives Relief 10 Mg Tabs (Cetirizine hcl) .... Take 1 tablet by mouth once a day 3)  Xanax 0.5 Mg Tabs (Alprazolam) .... Take 1 tablet by mouth three times a day 4)  Ferrous Sulfate 325 (65 Fe) Mg Tbec (Ferrous sulfate) .... Take 1 tablet by mouth once a day 5)  Oxycodone Hcl 10 Mg Tabs (Oxycodone hcl) .... Take 1 tab every 6 hrs as needed 6)  Ventolin Hfa 108 (90 Base) Mcg/act Aers (Albuterol sulfate) .... 2 puffs two times a day 7)  Prozac 20 Mg Caps (Fluoxetine hcl) .... 3 tabs q am 8)  Furosemide 20 Mg Tabs (Furosemide) .... Take 1 tablet by mouth once a day as needed 9)  Klor-con M20 20 Meq Cr-tabs (Potassium chloride crys cr) .... One tab by mouth three times  a day 10)  B-100 Complex Tabs (Vitamins-lipotropics) .... Take 1 tablet by mouth once a day 11)  Miralax Powd (Polyethylene glycol 3350) .Marland Kitchen.. 17gm daily 12)  Geodon 20 Mg Caps (Ziprasidone hcl) .... 2 in the morning, and 1 at bedtime 13)  Senokot S 8.6-50 Mg Tabs (Sennosides-docusate sodium) .... Take 1 tablet by mouth two times a day 14)  Clonidine Hcl 0.2 Mg Tabs (Clonidine hcl) .... Take 1 tablet by mouth once a day 15)  Albuterol Sulfate (2.5 Mg/46ml) 0.083% Nebu (Albuterol sulfate) .... Use on vial per nebulizer every 6 hours as needed for wheezing 16)  Ipratropium Bromide 0.02 % Soln (Ipratropium bromide) .... Use one vial per nebulizer every 6 hours as needed for wheezing 17)  Cromolyn Sodium 4 % Soln (Cromolyn sodium) .... One to two drops to affected eyes  four times daily as needed  Patient Instructions: 1)  F/U in 4 weeks. 2)  STOP coumadin and you do not need PT/INR anymore. 3)  Med for allergic conjunctivitis Prescriptions: CROMOLYN SODIUM 4 % SOLN (CROMOLYN SODIUM) one to two drops to affected eyes  four times daily  as needed  #49ml x 2   Entered and Authorized by:   Syliva Overman MD   Signed by:   Syliva Overman MD on 04/11/2010   Method used:   Electronically to        [facility] [phone]* (retail)       [address]       [place], [place]  [address]       Ph: [phone]       Fax: [phone]   RxID:   [id number] KLOR-CON M20 20 MEQ CR-TABS (POTASSIUM CHLORIDE CRYS CR) one tab by mouth three times a day  #90 x 4   Entered by:   Everitt Amber LPN   Authorized by:   Syliva Overman MD   Signed by:   Everitt Amber LPN on 27/25/3664   Method used:   Electronically to        [facility] [phone]* (retail)       [address]       [place], [place]  [address]       Ph: [phone]       Fax: [phone]   RxID:   [id number] CLONIDINE HCL 0.2 MG TABS (CLONIDINE HCL) Take 1 tablet by mouth once a day  #30 x 2   Entered by:   Everitt Amber LPN   Authorized by:   Syliva Overman MD   Signed by:   Everitt Amber LPN on 22/12/5425   Method used:   Electronically to        [facility] [phone]* (retail)       [address]       [place], [place]  [address]       Ph: [phone]       Fax: [phone]   RxID:   [id number] WARFARIN SODIUM 5 MG TABS (WARFARIN SODIUM) uad  #60 x 2   Entered by:   Everitt Amber LPN   Authorized by:   Syliva Overman MD   Signed by:   Everitt Amber LPN on 37/16/9678   Method used:   Electronically to        [facility] [phone]* (retail)       [address]       [place], [place]  [address]       Ph: [phone]  Fax: [phone]   RxID:   [id number] ZYRTEC HIVES RELIEF 10 MG TABS (CETIRIZINE HCL) Take 1 tablet by mouth once a day  #30 x 4   Entered by:   Everitt Amber LPN   Authorized by:   Syliva Overman MD   Signed by:   Everitt Amber LPN on 30/16/0109   Method used:   Electronically to        [facility] [phone]* (retail)       [address]       [place], [place]  [address]       Ph: [phone]        Fax: [phone]   RxID:   [id number]

## 2010-12-04 NOTE — Miscellaneous (Signed)
Summary: Home Care Report  Home Care Report   Imported By: Lind Guest 11/09/2009 08:55:26  _____________________________________________________________________  External Attachment:    Type:   Image     Comment:   External Document

## 2010-12-04 NOTE — Miscellaneous (Signed)
Summary: Home Care Report  Home Care Report   Imported By: Lind Guest 03/07/2010 11:52:07  _____________________________________________________________________  External Attachment:    Type:   Image     Comment:   External Document

## 2010-12-04 NOTE — Miscellaneous (Signed)
Summary: Home Care Report  Home Care Report   Imported By: Lind Guest 02/21/2010 14:54:35  _____________________________________________________________________  External Attachment:    Type:   Image     Comment:   External Document

## 2010-12-04 NOTE — Progress Notes (Signed)
  Phone Note Call from Patient   Caller: Patient Summary of Call: patient states she can not tolerate lexapro, severe headache Initial call taken by: Adella Hare LPN,  December 25, 2009 9:28 AM  Follow-up for Phone Call        pls advise her to d/c the lexapro. I will send in pristiq in its place she can start that today.  pLS erx pristiq 50mg  one daily #30 refill 4 Follow-up by: Syliva Overman MD,  December 25, 2009 12:25 PM  Additional Follow-up for Phone Call Additional follow up Details #1::        Patient aware Additional Follow-up by: Everitt Amber LPN,  December 25, 2009 12:58 PM    New/Updated Medications: PRISTIQ 50 MG XR24H-TAB (DESVENLAFAXINE SUCCINATE) Take 1 tablet by mouth once a day Prescriptions: PRISTIQ 50 MG XR24H-TAB (DESVENLAFAXINE SUCCINATE) Take 1 tablet by mouth once a day  #30 x 4   Entered by:   Everitt Amber LPN   Authorized by:   Syliva Overman MD   Signed by:   Everitt Amber LPN on 37/62/8315   Method used:   Electronically to        [facility] [phone]* (retail)       [address]       [place], [place]  [address]       Ph: [phone]       Fax: [phone]   RxID:   [id number]

## 2010-12-04 NOTE — Letter (Signed)
Summary: CCME  CCME   Imported By: Lind Guest 02/21/2010 15:26:04  _____________________________________________________________________  External Attachment:    Type:   Image     Comment:   External Document

## 2010-12-04 NOTE — Progress Notes (Signed)
Summary: MEDICINE  Phone Note Call from Patient   Summary of Call: MOVING BACK TO [place] NEEDS TO SPEAK WITH YOU ABOUT HER MEDICINE CALL BACK AT [phone] OR [phone] Initial call taken by: Lind Guest,  June 20, 2010 1:37 PM  Follow-up for Phone Call        returned call, no answer Follow-up by: Adella Hare LPN,  June 20, 2010 4:03 PM  Additional Follow-up for Phone Call Additional follow up Details #1::        patient in office today Additional Follow-up by: Adella Hare LPN,  June 21, 2010 3:40 PM

## 2010-12-04 NOTE — Progress Notes (Signed)
  Phone Note Call from Patient   Caller: Patient Summary of Call: patient states she has not had a period since she had her surgery. could her meds be the cause of this?  also has seen pristiq commercial, can she try this? Initial call taken by: Worthy Keeler LPN,  November 20, 2009 2:54 PM  Follow-up for Phone Call        stresss will play havoc with menses, ensure with her she still has her womb ie no hyst, for some reason I thought she had one, also let her know pristiq is for depression, give her current meds more time esp since she has ahd some adverse reactions in the past Follow-up by: Syliva Overman MD,  November 20, 2009 5:22 PM  Additional Follow-up for Phone Call Additional follow up Details #1::        Phone Call Completed Additional Follow-up by: Worthy Keeler LPN,  November 21, 2009 11:03 AM

## 2010-12-04 NOTE — Progress Notes (Signed)
  Phone Note Other Incoming   Caller: dr Presli Fanguy Summary of Call: opls contact pt . re-iterate the need to stop coumadin till blood retested. Ask home health to check tomorrow  as a stat weather permitting, if not possible on Wednesday pls Initial call taken by: Syliva Overman MD,  November 13, 2009 1:16 PM  Follow-up for Phone Call        called patient, left message called home health nurse, left message called patient, no answer Worthy Keeler LPN  November 13, 2009 2:42 PM called patient, called home health nurse, no answer Worthy Keeler LPN  November 13, 2009 3:40 PM  Follow-up by: Worthy Keeler LPN,  November 13, 2009 1:38 PM  Additional Follow-up for Phone Call Additional follow up Details #1::        spoke with patient this morning, message sent to doc Additional Follow-up by: Worthy Keeler LPN,  November 15, 2009 1:54 PM

## 2010-12-04 NOTE — Progress Notes (Signed)
  Phone Note Call from Patient   Caller: Patient Summary of Call: reports episatxis and coughing up blood, states she was "about to take coumadin" although I have told her 2 days ago to stop until further notice since she was overanticoagulated last Friday, several attempts to contact her that day after the OV were unsuccesful until Sunday night. i advised her again of the impt of making herself avaoilable after blood tests for directions about coumadin and let her also know that it is a dangerous drug if not properly monitored, which I am unable to doi currently. I advised her to goto the EDtonight..I also let her know we would be contacting her tomorrrow and sending her to [facility] to have bld drawn and sh should keep her phone on to get the result with directions Initial call taken by: Syliva Overman MD,  November 14, 2009 8:57 PM  Follow-up for Phone Call        pls call pt first thing in the morning and direct her to go to the outpt lab at [facility] to have a stat pT/iNR and fax in order, pls stress the impt to her of keeping on her phone so we can contact her before the end of the workday with directions on her coumadin.  THIS iS A PRIORITY, thanks Follow-up by: Syliva Overman MD,  November 14, 2009 9:00 PM  Additional Follow-up for Phone Call Additional follow up Details #1::        patient states she went to er last night, bp was 239/139 and when she left it was 177/96  pt was 22.5 inr 2.0 was advised to take coumadin at hospital last night so she took 5mg  at hospital but hasnt taken any today yet Additional Follow-up by: Worthy Keeler LPN,  November 15, 2009 11:03 AM    Additional Follow-up for Phone Call Additional follow up Details #2::    COMING IN TODAYpt advised to start coumadin 5 mg one daily, will need rept INR next week Monday. Pt come tomorrow for  BP evaluation, p,s give her an appt Follow-up by: Syliva Overman MD,  November 15, 2009 12:29 PM  Additional Follow-up  for Phone Call Additional follow up Details #3:: Details for Additional Follow-up Action Taken: COMING IN TODAY SHE SAID Additional Follow-up by: Lind Guest,  November 16, 2009 9:16 AM

## 2010-12-04 NOTE — Progress Notes (Signed)
Summary: speak with jamie  Phone Note Call from Patient   Summary of Call: would like to speak with jamie. [phone] Initial call taken by: Rudene Anda,  November 22, 2009 11:53 AM  Follow-up for Phone Call        returned call,left message Follow-up by: Worthy Keeler LPN,  November 22, 2009 3:37 PM  Additional Follow-up for Phone Call Additional follow up Details #1::        call completed yesterday, patient was asking when to do bloodwork Additional Follow-up by: Worthy Keeler LPN,  November 23, 2009 10:12 AM     Appended Document: speak with jamie order faxed to [facility]

## 2010-12-04 NOTE — Letter (Signed)
Summary: medical release  medical release   Imported By: Lind Guest 11/13/2009 11:26:33  _____________________________________________________________________  External Attachment:    Type:   Image     Comment:   External Document

## 2010-12-04 NOTE — Letter (Signed)
Summary: Letter  Letter   Imported By: Lind Guest 03/01/2010 14:18:13  _____________________________________________________________________  External Attachment:    Type:   Image     Comment:   External Document

## 2010-12-04 NOTE — Progress Notes (Signed)
Summary: MEDICINE  Phone Note Call from Patient   Summary of Call: NEEDS HER LASIX AND 2 INHALER SENT TO [facility] IN [place]  SHE IS COMING FOR HER F UP ON THURSDAY @ 1:15 Initial call taken by: Lind Guest,  February 12, 2010 10:46 AM  Follow-up for Phone Call        from the d/c summary no lasix was prescribed, pls refill the ventolin x 1 on original med list Follow-up by: Syliva Overman MD,  February 12, 2010 12:27 PM  Additional Follow-up for Phone Call Additional follow up Details #1::        if she reports leg swellin erx lasix 20mg  one daily as needed #10 only , and potassium one daily as needed, take every day that lasix is taken # 10 only  Additional Follow-up by: Syliva Overman MD,  February 12, 2010 12:30 PM    Additional Follow-up for Phone Call Additional follow up Details #2::    She is still having swelling in both ankles and is aware of the medication sent in for her. She is coming in thursday for f/u Follow-up by: Everitt Amber LPN,  February 12, 2010 12:42 PM  New/Updated Medications: FUROSEMIDE 20 MG TABS (FUROSEMIDE) Take 1 tablet by mouth once a day as needed KLOR-CON M20 20 MEQ CR-TABS (POTASSIUM CHLORIDE CRYS CR) Take 1 tablet by mouth once a day as needed with Lasix Prescriptions: KLOR-CON M20 20 MEQ CR-TABS (POTASSIUM CHLORIDE CRYS CR) Take 1 tablet by mouth once a day as needed with Lasix  #10 x 0   Entered by:   Everitt Amber LPN   Authorized by:   Syliva Overman MD   Signed by:   Everitt Amber LPN on 60/45/4098   Method used:   Electronically to        [facility] [phone]* (retail)       [address]       [place], [place]  [address]       Ph: [phone]       Fax: [phone]   RxID:   [id number] FUROSEMIDE 20 MG TABS (FUROSEMIDE) Take 1 tablet by mouth once a day as needed  #10 x 0   Entered by:   Everitt Amber LPN   Authorized by:   Syliva Overman MD   Signed by:   Everitt Amber LPN on 44/01/4741   Method used:   Electronically to        [facility] [phone]* (retail)       [address]       [place], [place]  [address]       Ph: [phone]       Fax: [phone]   RxID:   [id number] VENTOLIN HFA 108 (90 BASE) MCG/ACT AERS (ALBUTEROL SULFATE) 2 puffs two times a day  #1 x 2   Entered by:   Everitt Amber LPN   Authorized by:   Syliva Overman MD   Signed by:   Everitt Amber LPN on 27/04/2375   Method used:   Electronically to        [facility] [phone]* (retail)       [address]       [place], [place]  [address]       Ph: [phone]       Fax: [phone]   RxID:   [id number]

## 2010-12-04 NOTE — Progress Notes (Signed)
  Phone Note Call from Patient   Summary of Call: [facility] INR 1.94 Patient needs to increase Coumadin 5mg  to 1 1/2 tabs thurs, fri and sat 1 tab sun mon tues and wed. Rept 11/27/09  ****Tried to call [facility] because I couldn't reach [place] and she said that they could no longer see [facility] if she was going to outpatient therapy, and that is what she told them the last time they were there. Do you know if this is the case? If not fax orders to [facility]  [phone] Initial call taken by: Everitt Amber,  November 16, 2009 4:16 PM  Follow-up for Phone Call        she gets around and goes for her labs and to appts etc, so they do not have keep going there to draw her blood, verify if this is ok with her before speaking with them, also her bP cuff is accurate, she brought it in today and I checked it during the nurse visit Follow-up by: Syliva Overman MD,  November 16, 2009 10:16 PM  Additional Follow-up for Phone Call Additional follow up Details #1::        noted and patient, has started to go to outpatient lab for blood draws Additional Follow-up by: Worthy Keeler LPN,  November 20, 2009 9:00 AM

## 2010-12-04 NOTE — Letter (Signed)
Summary: [facility]  [facility]   Imported By: Lind Guest 03/29/2010 08:55:09  _____________________________________________________________________  External Attachment:    Type:   Image     Comment:   External Document

## 2010-12-04 NOTE — Progress Notes (Signed)
Summary: CALL BACK  Phone Note Call from Patient   Summary of Call: Jane English RETURNING CALL  BUT IN CLASS SHE SAID TO Jane English AT [phone] Initial call taken by: Lind Guest,  November 15, 2009 10:54 AM  Follow-up for Phone Call        waiting on response from dr for new orders, will call when new order available Follow-up by: Worthy Keeler LPN,  November 15, 2009 11:51 AM

## 2010-12-04 NOTE — Progress Notes (Signed)
Summary: [facility]  [facility]   Imported By: Lind Guest 11/13/2009 16:33:39  _____________________________________________________________________  External Attachment:    Type:   Image     Comment:   External Document

## 2010-12-04 NOTE — Progress Notes (Signed)
Summary: IRON PILLS AND BW RESULTS  Phone Note Call from Patient   Summary of Call: THE RX CALLED IN [facility] IS [facility]SE P[facility]S SHE SAID SHE WANTED THE IRON PILLS THAT WAS LITTLE ANSD RED LIKE WHAT SHE HAD AT THE HOSPITAL  AND WANTS TI KNOW ABOUT HER BLOOD WORK AND ABOUT TAKING HER COUDAMIN CALL HER BACK AT [phone] OR [phone] TO LET HER KNOW Initial call taken by: Lind Guest,  November 06, 2009 2:58 PM  Follow-up for Phone Call        reminded her of how to take her coumadin and when to repeat.  The Ferrex that was called in was the big tabs and she was wanting the small red tab. Called pharmacist and she said that the small red one is the ferrous sulfate 325. Can she take this instead? [facility] [address] Follow-up by: Everitt Amber,  November 06, 2009 3:15 PM  Additional Follow-up for Phone Call Additional follow up Details #1::        prescription fro ferroussulfatesent in this is ok let her know Additional Follow-up by: Syliva Overman MD,  November 06, 2009 5:16 PM    Additional Follow-up for Phone Call Additional follow up Details #2::    Pt aware Follow-up by: Everitt Amber,  November 07, 2009 9:06 AM  New/Updated Medications: FERROUS SULFATE 325 (65 FE) MG TBEC (FERROUS SULFATE) Take 1 tablet by mouth once a day Prescriptions: FERROUS SULFATE 325 (65 FE) MG TBEC (FERROUS SULFATE) Take 1 tablet by mouth once a day  #30 x 4   Entered and Authorized by:   Syliva Overman MD   Signed by:   Syliva Overman MD on 11/06/2009   Method used:   Electronically to        [facility] [phone]* (retail)       [address]       [place], [place]  [address]       Ph: [phone]       Fax: [phone]   RxID:   [id number]

## 2010-12-04 NOTE — Letter (Signed)
Summary: medical release  medical release   Imported By: Lind Guest 03/23/2010 15:44:15  _____________________________________________________________________  External Attachment:    Type:   Image     Comment:   External Document

## 2010-12-04 NOTE — Progress Notes (Signed)
  Phone Note Call from Patient   Caller: Son Summary of Call: pt's son called [facility] approx 20 mins after I spoke with [place] directing her to go to the ED because of her complaints of coughing up blood and epistaxis to ask what needed topbe done and should he take her to the Ed . I advised him that i had told [place] not too .long ago that she did need to go to the ED tonight, he statesd he would take her Initial call taken by: Syliva Overman MD,  November 14, 2009 9:04 PM

## 2010-12-04 NOTE — Progress Notes (Signed)
Summary: medicine  Phone Note Call from Patient   Summary of Call: out of her xanax please rx to [facility] on [address] in [place] Initial call taken by: Lind Guest,  June 19, 2010 8:40 AM  Follow-up for Phone Call        Rx Called In Follow-up by: Adella Hare LPN,  June 19, 2010 8:45 AM    New/Updated Medications: XANAX 0.5 MG TABS (ALPRAZOLAM) Take 1 tablet by mouth three times a day Prescriptions: XANAX 0.5 MG TABS (ALPRAZOLAM) Take 1 tablet by mouth three times a day  #90 x 2   Entered by:   Adella Hare LPN   Authorized by:   Syliva Overman MD   Signed by:   Adella Hare LPN on 21/30/8657   Method used:   Printed then faxed to ...       [facility]  [address] [phone]* (retail)       [address]       [place], [place]  [address]       Ph: [phone]       Fax: [phone]   RxID:   [id number]

## 2010-12-04 NOTE — Assessment & Plan Note (Signed)
Summary: PHY   Vital Signs:  Patient profile:   40 year old female Menstrual status:  regular Height:      63 inches Weight:      168.75 pounds BMI:     30.00 O2 Sat:      98 % Pulse rate:   70 / minute Pulse rhythm:   regular Resp:     16 per minute BP sitting:   80 / 50  (left arm) Cuff size:   regular  Vitals Entered By: Everitt Amber LPN (June 12, 2010 2:56 PM)  Nutrition Counseling: [name] BMI is greater than 25 and therefore counseled on weight management options. CC: Cpe   Primary Care Provider:  Syliva Overman MD  CC:  Cpe.  History of Present Illness: Pt reports that she  has been feeling dizzy and light headed for the past 3 days. She deneis any recent fever or chills. her apetitie is fair and her bowel movements are regular. she is less depressed asd she has now been approved for disability, though she still rep[orts mental problems with irritability, and mood disturbance. She denies head or chest congestion. he denies dysuria or frequency. She reports heavy menses. She reports chronic back pain with lowe rext weak ness.  Current Medications (verified): 1)  Amlodipine Besylate 10 Mg Tabs (Amlodipine Besylate) .... Take 1 Tablet By Mouth Once A Day 2)  Zyrtec Hives Relief 10 Mg Tabs (Cetirizine Hcl) .... Take 1 Tablet By Mouth Once A Day 3)  Xanax 0.5 Mg Tabs (Alprazolam) .... Take 1 Tablet By Mouth Three Times A Day 4)  Ferrous Sulfate 325 (65 Fe) Mg Tbec (Ferrous Sulfate) .... Take 1 Tablet By Mouth Once A Day 5)  Oxycodone Hcl 10 Mg Tabs (Oxycodone Hcl) .... Take 1 Tab Every 6 Hrs As Needed 6)  Ventolin Hfa 108 (90 Base) Mcg/act Aers (Albuterol Sulfate) .... 2 Puffs Two Times A Day 7)  Prozac 20 Mg Caps (Fluoxetine Hcl) .... 3 Tabs Q Am 8)  Furosemide 20 Mg Tabs (Furosemide) .... Take 1 Tablet By Mouth Once A Day As Needed 9)  Klor-Con M20 20 Meq Cr-Tabs (Potassium Chloride Crys Cr) .... One Tab By Mouth Three Times A Day 10)  B-100 Complex  Tabs  (Vitamins-Lipotropics) .... Take 1 Tablet By Mouth Once A Day 11)  Miralax  Powd (Polyethylene Glycol 3350) .Marland Kitchen.. 17gm Daily 12)  Geodon 20 Mg Caps (Ziprasidone Hcl) .... 2 in The Morning, and 1 At Bedtime 13)  Senokot S 8.6-50 Mg Tabs (Sennosides-Docusate Sodium) .... Take 1 Tablet By Mouth Two Times A Day 14)  Clonidine Hcl 0.2 Mg Tabs (Clonidine Hcl) .... Take 1 Tablet By Mouth Once A Day 15)  Albuterol Sulfate (2.5 Mg/54ml) 0.083% Nebu (Albuterol Sulfate) .... Use On Vial Per Nebulizer Every 6 Hours As Needed For Wheezing 16)  Ipratropium Bromide 0.02 % Soln (Ipratropium Bromide) .... Use One Vial Per Nebulizer Every 6 Hours As Needed For Wheezing 17)  Cromolyn Sodium 4 % Soln (Cromolyn Sodium) .... One To Two Drops To Affected Eyes  Four Times Daily As Needed 18)  Bactroban 2 % Oint (Mupirocin) .... Apply Twice Daily As Neededfor 1 Week  Allergies (verified): No Known Drug Allergies  Review of Systems      See HPI General:  Complains of fatigue, malaise, sleep disorder, and weakness; denies chills and loss of appetite. Eyes:  Denies blurring and discharge. ENT:  Denies hoarseness, nasal congestion, sinus pressure, and sore throat. CV:  Complains  of shortness of breath with exertion; denies chest pain or discomfort, difficulty breathing while lying down, palpitations, and swelling of feet. Resp:  Denies cough and sputum productive. GI:  Denies abdominal pain, constipation, diarrhea, nausea, and vomiting. GU:  Denies dysuria and urinary frequency. MS:  Complains of low back pain, mid back pain, muscle weakness, and stiffness. Derm:  Denies itching, lesion(s), and rash. Neuro:  Complains of poor balance, tremors, and weakness; denies headaches, seizures, and sensation of room spinning. Psych:  Complains of anxiety, depression, irritability, and mental problems; denies suicidal thoughts/plans, thoughts of violence, and thoughts /plans of harming others. Endo:  Denies cold intolerance,  excessive hunger, excessive thirst, and polyuria. Heme:  Denies abnormal bruising and bleeding. Allergy:  Complains of seasonal allergies; denies hives or rash and itching eyes.  Physical Exam  General:  Well-developed,well-nourished, pt appears excessively sleepy and has difficulty providing a history Head:  Normocephalic and atraumatic without obvious abnormalities. No apparent alopecia or balding. Eyes:  No corneal or conjunctival inflammation noted. EOMI. Perrla. Funduscopic exam benign, without hemorrhages, exudates or papilledema. Vision grossly normal. Ears:  External ear exam shows no significant lesions or deformities.  Otoscopic examination reveals clear canals, tympanic membranes are intact bilaterally without bulging, retraction, inflammation or discharge. Hearing is grossly normal bilaterally. Nose:  External nasal examination shows no deformity or inflammation. Nasal mucosa are pink and moist without lesions or exudates. Mouth:  Oral mucosa and oropharynx without lesions or exudates.  Teeth in good repair. Neck:  No deformities, masses, or tenderness noted. Chest Wall:  No deformities, masses, or tenderness noted. Breasts:  No mass, nodules, thickening, tenderness, bulging, retraction, inflamation, nipple discharge or skin changes noted.   Lungs:  Normal respiratory effort, chest expands symmetrically. Lungs are clear to auscultation, no crackles or wheezes. Heart:  Normal rate and regular rhythm. S1 and S2 normal without gallop, murmur, click, rub or other extra sounds. Abdomen:  Bowel sounds positive,abdomen soft and non-tender without masses, organomegaly or hernias noted. Rectal:  No external abnormalities noted. Normal sphincter tone. No rectal masses or tenderness.Guaic neg stool Genitalia:  no external lesions, no vaginal atrophy, no friaility or hemorrhage, normal uterus size and position, and no adnexal masses or tenderness.   Msk:  No deformity or scoliosis noted of  thoracic or lumbar spine.   Pulses:  R and L carotid,radial,femoral,dorsalis pedis and posterior tibial pulses are full and equal bilaterally Extremities:  decreased ROM thoracolumbar spine Neurologic:  alert & oriented X3, cranial nerves II-XII intact, strength normal in all extremities, sensation intact to light touch, and sensation intact to pinprick.   Skin:  Intact without suspicious lesions or rashes Cervical Nodes:  No lymphadenopathy noted Axillary Nodes:  No palpable lymphadenopathy Psych:  Oriented X3, flat affect, moderately anxious, and poor concentration.     Impression & Recommendations:  Problem # 1:  FATIGUE (ICD-780.79) Assessment Deteriorated  Orders: T-CBC w/Diff ([address]-60454) T-TSH (09811-91478)  Problem # 2:  DEPRESSION, CHRONIC (ICD-311) Assessment: Unchanged  Her updated medication list for this problem includes:    Xanax 0.5 Mg Tabs (Alprazolam) .Marland Kitchen... Take 1 tablet by mouth three times a day    Prozac 20 Mg Caps (Fluoxetine hcl) .Marland KitchenMarland KitchenMarland KitchenMarland Kitchen 3 tabs q am  Problem # 3:  ESSENTIAL HYPERTENSION (ICD-401.9) Assessment: Comment Only  Her updated medication list for this problem includes:    Amlodipine Besylate 10 Mg Tabs (Amlodipine besylate) .Marland Kitchen... Take 1 tablet by mouth once a day    Furosemide 20 Mg Tabs (Furosemide) .Marland KitchenMarland KitchenMarland KitchenMarland Kitchen  Take 1 tablet by mouth once a day as needed    Clonidine Hcl 0.2 Mg Tabs (Clonidine hcl) .Marland Kitchen... Take 1 tablet by mouth once a day  Orders: T-Basic Metabolic Panel [id number])  BP today: 80/50, oivercorrected, pt sent to the ED, she is advised to d/c the meds and return in 1 week for re-eval Prior BP: 110/74 (05/08/2010)  Labs Reviewed: K+: 3.8 (03/01/2010) Creat: : 0.88 (03/01/2010)   Chol: 173 (01/02/2007)   HDL: 63 (01/02/2007)   LDL: 97 (01/02/2007)   TG: 67 (01/02/2007)  Problem # 4:  INTRINSIC ASTHMA, UNSPECIFIED (ICD-493.10)  Complete Medication List: 1)  Amlodipine Besylate 10 Mg Tabs (Amlodipine besylate) .... Take 1 tablet by  mouth once a day 2)  Zyrtec Hives Relief 10 Mg Tabs (Cetirizine hcl) .... Take 1 tablet by mouth once a day 3)  Xanax 0.5 Mg Tabs (Alprazolam) .... Take 1 tablet by mouth three times a day 4)  Ferrous Sulfate 325 (65 Fe) Mg Tbec (Ferrous sulfate) .... Take 1 tablet by mouth once a day 5)  Oxycodone Hcl 10 Mg Tabs (Oxycodone hcl) .... Take 1 tab every 6 hrs as needed 6)  Ventolin Hfa 108 (90 Base) Mcg/act Aers (Albuterol sulfate) .... 2 puffs two times a day 7)  Prozac 20 Mg Caps (Fluoxetine hcl) .... 3 tabs q am 8)  Furosemide 20 Mg Tabs (Furosemide) .... Take 1 tablet by mouth once a day as needed 9)  Klor-con M20 20 Meq Cr-tabs (Potassium chloride crys cr) .... One tab by mouth three times a day 10)  B-100 Complex Tabs (Vitamins-lipotropics) .... Take 1 tablet by mouth once a day 11)  Miralax Powd (Polyethylene glycol 3350) .Marland Kitchen.. 17gm daily 12)  Geodon 20 Mg Caps (Ziprasidone hcl) .... 2 in the morning, and 1 at bedtime 13)  Senokot S 8.6-50 Mg Tabs (Sennosides-docusate sodium) .... Take 1 tablet by mouth two times a day 14)  Clonidine Hcl 0.2 Mg Tabs (Clonidine hcl) .... Take 1 tablet by mouth once a day 15)  Albuterol Sulfate (2.5 Mg/64ml) 0.083% Nebu (Albuterol sulfate) .... Use on vial per nebulizer every 6 hours as needed for wheezing 16)  Ipratropium Bromide 0.02 % Soln (Ipratropium bromide) .... Use one vial per nebulizer every 6 hours as needed for wheezing 17)  Cromolyn Sodium 4 % Soln (Cromolyn sodium) .... One to two drops to affected eyes  four times daily as needed 18)  Bactroban 2 % Oint (Mupirocin) .... Apply twice daily as neededfor 1 week  Other Orders: T-Lipid Profile (09811-91478) Pap Smear (29562) Hemoccult Guaiac-1 spec.(in office) (13086)  Patient Instructions: 1)  F/U next week Friday. 2)  BMP prior to visit, ICD-9: 3)  Lipid Panel prior to visit, ICD-9: 4)  TSH prior to visit, ICD-9:  fasting asap (can do in [place] or in [place])e  5)  CBC w/ Diff prior to  visit, ICD-9: 6)  pLS stop clonidine as of tonight. 7)  pls ensure you drink sufficient water every day, and for the next 2 days drink gatorade also sinc your blood pressure is low. 8)  ypou need to go directly to the ed for further management, I have spoken to the MD there. Prescriptions: ZYRTEC HIVES RELIEF 10 MG TABS (CETIRIZINE HCL) Take 1 tablet by mouth once a day  #30 x 4   Entered by:   Everitt Amber LPN   Authorized by:   Syliva Overman MD   Signed by:   Everitt Amber LPN on 57/84/6962  Method used:   Electronically to        [facility] [phone]* (retail)       [address]       [place], [place]  [address]       Ph: [phone]       Fax: [phone]   RxID:   [id number]   Laboratory Results    Stool - Occult Blood Hemmoccult #1: negative Date: 06/12/2010 Comments: [id number] 1L 3/12 118 1012

## 2010-12-04 NOTE — Assessment & Plan Note (Signed)
Summary: bp ck  Nurse Visit   Vital Signs:  Patient profile:   40 year old female Menstrual status:  regular Height:      63 inches Weight:      169 pounds Pulse rate:   72 / minute BP sitting:   110 / 60  (left arm)  Vitals Entered By: Worthy Keeler LPN (November 16, 2009 1:33 PM)  Allergies: No Known Drug Allergies  Orders Added: 1)  INR/PT-[facility] [85610] 2)  No Charge Patient Arrived (NCPA0) [NCPA0] Prescriptions: GABAPENTIN 300 MG CAPS (GABAPENTIN) one cap by mouth bid  #60 x 4   Entered by:   Worthy Keeler LPN   Authorized by:   Syliva Overman MD   Signed by:   Worthy Keeler LPN on 38/75/6433   Method used:   Handwritten   RxID:   [id number]

## 2010-12-04 NOTE — Miscellaneous (Signed)
Summary: lab order  lab order sent

## 2010-12-04 NOTE — Miscellaneous (Signed)
  Clinical Lists Changes  Medications: Added new medication of KLOR-CON M20 20 MEQ CR-TABS (POTASSIUM CHLORIDE CRYS CR) Take 1 tablet by mouth once a day - Signed Rx of KLOR-CON M20 20 MEQ CR-TABS (POTASSIUM CHLORIDE CRYS CR) Take 1 tablet by mouth once a day;  #30 x 4;  Signed;  Entered by: Syliva Overman MD;  Authorized by: Syliva Overman MD;  Method used: Historical    Prescriptions: KLOR-CON M20 20 MEQ CR-TABS (POTASSIUM CHLORIDE CRYS CR) Take 1 tablet by mouth once a day  #30 x 4   Entered and Authorized by:   Syliva Overman MD   Signed by:   Syliva Overman MD on 06/29/2010   Method used:   Historical   RxID:   [id number]

## 2010-12-04 NOTE — Letter (Signed)
Summary: Letter  Letter   Imported By: Lind Guest 03/01/2010 14:24:30  _____________________________________________________________________  External Attachment:    Type:   Image     Comment:   External Document

## 2010-12-04 NOTE — Miscellaneous (Signed)
Summary: Home Care Report  Home Care Report   Imported By: Lind Guest 02/13/2010 15:49:47  _____________________________________________________________________  External Attachment:    Type:   Image     Comment:   External Document

## 2010-12-04 NOTE — Progress Notes (Signed)
Summary: [facility]   Imported By: Lind Guest 12/19/2009 09:51:44  _____________________________________________________________________  External Attachment:    Type:   Image     Comment:   External Document

## 2010-12-04 NOTE — Assessment & Plan Note (Signed)
Summary: recert   Allergies: No Known Drug Allergies   Complete Medication List: 1)  Loratadine 10 Mg Tabs (Loratadine) .... One tab by mouth qd 2)  Amlodipine Besylate 10 Mg Tabs (Amlodipine besylate) .... Take 1 tablet by mouth once a day 3)  Clonidine Hcl 0.3 Mg Tabs (Clonidine hcl) .... One and a half at bedtime 4)  Zyrtec Hives Relief 10 Mg Tabs (Cetirizine hcl) .... Take 1 tablet by mouth once a day 5)  Xanax 0.5 Mg Tabs (Alprazolam) .... Take 1 tablet by mouth three times a day 6)  Ferrous Sulfate 325 (65 Fe) Mg Tbec (Ferrous sulfate) .... Take 1 tablet by mouth once a day 7)  Oxycodone Hcl 10 Mg Tabs (Oxycodone hcl) .... Take 1 tab every 6 hrs as needed 8)  Vicodin 5-500 Mg Tabs (Hydrocodone-acetaminophen) .... One tab q 4 hrs as needed 9)  Vistaril 25 Mg Caps (Hydroxyzine pamoate) .... Take 1 capsule by mouth three times a day as needed 10)  Gabapentin 300 Mg Caps (Gabapentin) .... One cap by mouth bid 11)  Warfarin Sodium 2 Mg Tabs (Warfarin sodium) .... Uad 12)  Warfarin Sodium 5 Mg Tabs (Warfarin sodium) .... Uad 13)  Diazepam 5 Mg Tabs (Diazepam) .... One tab q 8 hrs as needed 14)  Ventolin Hfa 108 (90 Base) Mcg/act Aers (Albuterol sulfate) .... 2 puffs two times a day 15)  Nystatin 100000 Unit/ml Susp (Nystatin) .... Swish and spit 4-8ml four times per day 16)  Abilify 2 Mg Tabs (Aripiprazole) .... Take 1 tablet by mouth once a day 17)  Prozac 20 Mg Caps (Fluoxetine hcl) .... Take 1 tablet by mouth three times a day 18)  Gabapentin 300 Mg Caps (Gabapentin) .... 3 at bedtime 19)  Furosemide 20 Mg Tabs (Furosemide) .... Take 1 tablet by mouth once a day as needed 20)  Klor-con M20 20 Meq Cr-tabs (Potassium chloride crys cr) .... Take 1 tablet by mouth once a day as needed with lasix 21)  Amoxicillin 500 Mg Caps (Amoxicillin) .... Take 4 capsules by mouth 1 hr before dental appt. recertification form reviewed and signed

## 2010-12-04 NOTE — Assessment & Plan Note (Signed)
Summary: tb  Nurse Visit   Allergies: No Known Drug Allergies  Immunizations Administered:  PPD Skin Test:    Vaccine Type: PPD    Site: left forearm    Mfr: Sanofi Pasteur    Dose: 0.1 ml    Route: ID    Given by: Adella Hare LPN    Exp. Date: 08/17/2011    Lot #: [id number]  Orders Added: 1)  TB Skin Test [86580] 2)  Admin 1st Vaccine [16109]
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
## 2010-12-04 NOTE — Progress Notes (Signed)
  Phone Note Call from Patient   Caller: Patient Summary of Call: patient wants abilify increased to 5mg . states the lower dose is doing nothing for her at all. Initial call taken by: Adella Hare LPN,  January 25, 2010 2:53 PM

## 2010-12-04 NOTE — Progress Notes (Signed)
Summary: ANKLES SWELLING  Phone Note Call from Patient   Summary of Call: ANKLES SWELLING CALL BACK AT [phone] Initial call taken by: Lind Guest,  January 24, 2010 11:16 AM  Follow-up for Phone Call        Do you want patient to come back in? She was here last week  Follow-up by: Everitt Amber LPN,  January 24, 2010 1:36 PM  Additional Follow-up for Phone Call Additional follow up Details #1::        Tell pt to elevate legs, & reduce salt intake.  If swelling doesn't go down in the next day or 2 will need an appt. Additional Follow-up by: Esperanza Sheets PA,  January 24, 2010 2:18 PM    Additional Follow-up for Phone Call Additional follow up Details #2::    called patient, no answer Follow-up by: Everitt Amber LPN,  January 24, 2010 2:30 PM  Additional Follow-up for Phone Call Additional follow up Details #3:: Details for Additional Follow-up Action Taken: patient aware Additional Follow-up by: Adella Hare LPN,  January 25, 2010 2:51 PM

## 2010-12-04 NOTE — Letter (Signed)
Summary: ccme   ccme   Imported By: Lind Guest 03/26/2010 09:28:53  _____________________________________________________________________  External Attachment:    Type:   Image     Comment:   External Document
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
## 2010-12-04 NOTE — Progress Notes (Signed)
  Phone Note Call from Patient   Caller: Patient Summary of Call: can you write antibiotic for her, dentist not doing it Initial call taken by: Adella Hare LPN,  February 15, 2010 4:33 PM  Follow-up for Phone Call        Rx Called In Follow-up by: Esperanza Sheets PA,  February 15, 2010 5:00 PM  Additional Follow-up for Phone Call Additional follow up Details #1::        left detailed message Additional Follow-up by: Adella Hare LPN,  February 16, 2010 8:46 AM    New/Updated Medications: AMOXICILLIN 500 MG CAPS (AMOXICILLIN) take 4 capsules by mouth 1 hr before dental appt. Prescriptions: AMOXICILLIN 500 MG CAPS (AMOXICILLIN) take 4 capsules by mouth 1 hr before dental appt.  #4 x 0   Entered and Authorized by:   Esperanza Sheets PA   Signed by:   Esperanza Sheets PA on 02/15/2010   Method used:   Electronically to        [facility] [phone]* (retail)       [address]       [name], [place]  [address]       Ph: [phone]       Fax: [phone]   RxID:   [id number]

## 2010-12-04 NOTE — Progress Notes (Signed)
  Phone Note From Pharmacy   Summary of Call: iprat-albut requires pa  im pretty sure they will pay for these separate Initial call taken by: Adella Hare LPN,  March 02, 2010 4:04 PM  Follow-up for Phone Call        change for separate if needed at same strength, dx is asthma, thanks Follow-up by: Syliva Overman MD,  Mar 04, 2010 7:36 PM  Additional Follow-up for Phone Call Additional follow up Details #1::        Prescription resent Additional Follow-up by: Adella Hare LPN,  Mar 05, 1609 2:15 PM    New/Updated Medications: ALBUTEROL SULFATE (2.5 MG/3ML) 0.083% NEBU (ALBUTEROL SULFATE) use on vial per nebulizer every 6 hours as needed for wheezing IPRATROPIUM BROMIDE 0.02 % SOLN (IPRATROPIUM BROMIDE) use one vial per nebulizer every 6 hours as needed for wheezing Prescriptions: IPRATROPIUM BROMIDE 0.02 % SOLN (IPRATROPIUM BROMIDE) use one vial per nebulizer every 6 hours as needed for wheezing  #81mnth x 2   Entered by:   Adella Hare LPN   Authorized by:   Syliva Overman MD   Signed by:   Adella Hare LPN on 96/02/5408   Method used:   Electronically to        [facility] [phone]* (retail)       [address]       [place], [place]  [address]       Ph: [phone]       Fax: [phone]   RxID:   [id number] ALBUTEROL SULFATE (2.5 MG/3ML) 0.083% NEBU (ALBUTEROL SULFATE) use on vial per nebulizer every 6 hours as needed for wheezing  #1 mnth x 2   Entered by:   Adella Hare LPN   Authorized by:   Syliva Overman MD   Signed by:   Adella Hare LPN on 64/40/3474   Method used:   Electronically to        [facility] [phone]* (retail)       [address]       [place], [place]  [address]       Ph: [phone]       Fax: [phone]   RxID:   [id number]

## 2010-12-04 NOTE — Letter (Signed)
Summary: Work Excuse  [facility]  [address]   [place], [place] [address]   Phone: [phone]  Fax: [phone]    Today's Date: March 01, 2010  Name of Patient: Jane English  The above named patient had a medical visit today at: 9:45 (was brought by [place])  Please take this into consideration when reviewing the time away from work/school.    Special Instructions:  [* ] None  [  ] To be off the remainder of today, returning to the normal work / school schedule tomorrow.  [  ] To be off until the next scheduled appointment on ______________________.  [  ] Other ________________________________________________________________ ________________________________________________________________________   Sincerely yours,   Milus Mallick. Lodema Hong

## 2010-12-04 NOTE — Progress Notes (Signed)
Summary: RX  Phone Note Call from Patient   Summary of Call: WENT TO ED SATURDAY AND THEY SAID THAT SHE NEEDED POT. PILLS PLEASE FAX TO [facility] ON [address] IN [place] AND ALSO NEEDS FOR YOU TO CALL HER [phone]  Follow-up for Phone Call        advise and erx potassium onethree times daily #90 needs ov this week with me after you talk with her send her to front desk for appt [pls Follow-up by: Syliva Overman MD,  Mar 26, 2010 10:31 AM  Additional Follow-up for Phone Call Additional follow up Details #1::        Rx Called In Additional Follow-up by: Adella Hare LPN,  Mar 26, 2010 11:00 AM    Additional Follow-up for Phone Call Additional follow up Details #2::    patient aware Follow-up by: Adella Hare LPN,  Mar 26, 2010 2:07 PM  New/Updated Medications: KLOR-CON M20 20 MEQ CR-TABS (POTASSIUM CHLORIDE CRYS CR) one tab by mouth three times a day Prescriptions: KLOR-CON M20 20 MEQ CR-TABS (POTASSIUM CHLORIDE CRYS CR) one tab by mouth three times a day  #90 x 0   Entered by:   Adella Hare LPN   Authorized by:   Syliva Overman MD   Signed by:   Adella Hare LPN on 82/95/6213   Method used:   Electronically to        [facility] [phone]* (retail)       [address]       [place], [place]  [address]       Ph: [phone]       Fax: [phone]   RxID:   [id number]

## 2010-12-04 NOTE — Assessment & Plan Note (Signed)
Summary: OV   Vital Signs:  Patient profile:   40 year old female Menstrual status:  regular Height:      63 inches Weight:      169 pounds BMI:     30.05 O2 Sat:      98 % Pulse rate:   78 / minute Pulse rhythm:   regular Resp:     16 per minute BP sitting:   138 / 90  Vitals Entered By: Everitt Amber (November 10, 2009 10:52 AM)  Nutrition Counseling: [name] BMI is greater than 25 and therefore counseled on weight management options. CC: Follow up chronic problems, needs rx for neurontin, rx for sleep, rx for her bipolar   Primary Care Provider:  Syliva Overman MD  CC:  Follow up chronic problems, needs rx for neurontin, rx for sleep, and rx for her bipolar.  History of Present Illness: Pt reports that she is getting stronger, however she states her nerves are tererible. She gets irritated  easily, and is asking fo a mood stabiliser. She had filled abilify , 5 tabs only was what she was able to afford. She nahd been on hydroxyzine in hosdp , has a mental health appt in the next approx 3 weeks , and i have advised it is imperative she go, not only for med mx , but also for counselling.She agrees. She has an approx 5 day h/o head and chest congestion with sinus pressure, occasional yellow drainage, and cough occasionally productive of yellow sputum.She ahs had no documented fever or chills. She c/o swelling of the ankles at the ends of the day if she is up and about alot, and has concerns of dyspnea at times. This is not related to activity or position, and has no associated hemoptysi, chest pain , pND or orthopnea.  Current Medications (verified): 1)  Amlodipine Besylate 10 Mg Tabs (Amlodipine Besylate) .... Take 1 Tablet By Mouth Once A Day 2)  Loratadine 10 Mg Tabs (Loratadine) .... One Tab By Mouth Qd 3)  Clonidine Hcl 0.3 Mg Tabs (Clonidine Hcl) .... Take 1 Tablet By Mouth Two Times A Take 1 Tablet By Mouth Two Times A Day 4)  Omeprazole 40 Mg Cpdr (Omeprazole) .... Take 1  Capsule By Mouth Once A Day 5)  Warfarin Sodium 2 Mg Tabs (Warfarin Sodium) .... Uad 6)  Maxzide 75-50 Mg Tabs (Triamterene-Hctz) .... Take 1 Tablet By Mouth Once A Day 7)  Amlodipine Besylate 10 Mg Tabs (Amlodipine Besylate) .... Take 1 Tablet By Mouth Once A Day 8)  Clonidine Hcl 0.3 Mg Tabs (Clonidine Hcl) .... One and A Half At Bedtime 9)  Zyrtec Hives Relief 10 Mg Tabs (Cetirizine Hcl) .... Take 1 Tablet By Mouth Once A Day 10)  Xanax 0.5 Mg Tabs (Alprazolam) .... Take 1 Tablet By Mouth Three Times A Day 11)  Warfarin Sodium 5 Mg Tabs (Warfarin Sodium) .... Uad 12)  Ferrous Sulfate 325 (65 Fe) Mg Tbec (Ferrous Sulfate) .... Take 1 Tablet By Mouth Once A Day 13)  Oxycodone Hcl 10 Mg Tabs (Oxycodone Hcl) .... Take 1 Tab Every 6 Hrs As Needed 14)  Vicodin 5-500 Mg Tabs (Hydrocodone-Acetaminophen) .... One Tab Q 4 Hrs As Needed  Allergies (verified): No Known Drug Allergies  Review of Systems      See HPI General:  Complains of fatigue, malaise, and weakness. ENT:  See HPI. CV:  See HPI. Resp:  See HPI. GI:  Denies abdominal pain, diarrhea, nausea, and vomiting. GU:  Denies dysuria and urinary frequency. MS:  Complains of low back pain, mid back pain, and muscle weakness. Psych:  Complains of anxiety, depression, easily tearful, irritability, and mental problems; denies suicidal thoughts/plans, thoughts of violence, unusual visions or sounds, and thoughts /plans of harming others. Heme:  Denies abnormal bruising and bleeding.  Physical Exam  General:  Well-developed,well-nourished,in no acute distress; alert,appropriate and cooperative throughout examination. Pt somewhat agitated with poor eye contact. HEENT: No facial asymmetry,  EOMI, frontal and maxillary sinus tenderness, TM's Clear, oropharynx  pink and moist.   Chest: adequate air entry, bilateral crackles CVS: S1, S2, No murmurs, No S3.   Abd: Soft, Nontender.  AV:[id number] ROM spine,adequate in  hips, shoulders and knees.    Ext: No edema.   CNS: CN 2-12 intact,reduced  power tone and sensation in right upper and lower ext  Skin: Intact, no visible lesions or rashes.  Psych: Good eye contact, normal affect.  Memory intact,both anxious and depressed appearing.    Impression & Recommendations:  Problem # 1:  ACUTE BRONCHITIS (ICD-466.0) Assessment Comment Only  Her updated medication list for this problem includes:    Penicillin V Potassium 500 Mg Tabs (Penicillin v potassium) ..... One tab by mouth tid    Tessalon Perles 100 Mg Caps (Benzonatate) ..... One cap by mouth three times a day  Problem # 2:  ACUTE SINUSITIS, UNSPECIFIED (ICD-461.9) Assessment: Comment Only  Her updated medication list for this problem includes:    Penicillin V Potassium 500 Mg Tabs (Penicillin v potassium) ..... One tab by mouth tid    Tessalon Perles 100 Mg Caps (Benzonatate) ..... One cap by mouth three times a day  Problem # 3:  DVT (ICD-453.40) Assessment: Comment Only  Orders: INR/PT-[facility] ([address])  Problem # 4:  DEPRESSION, CHRONIC (ICD-311) Assessment: Deteriorated  Her updated medication list for this problem includes:    Xanax 0.5 Mg Tabs (Alprazolam) .Marland Kitchen... Take 1 tablet by mouth three times a day    Vistaril 25 Mg Caps (Hydroxyzine pamoate) .Marland Kitchen... Take 1 capsule by mouth three times a day as needed  Problem # 5:  ESSENTIAL HYPERTENSION (ICD-401.9) Assessment: Deteriorated  Her updated medication list for this problem includes:    Amlodipine Besylate 10 Mg Tabs (Amlodipine besylate) .Marland Kitchen... Take 1 tablet by mouth once a day    Clonidine Hcl 0.3 Mg Tabs (Clonidine hcl) .Marland Kitchen... Take 1 tablet by mouth two times a take 1 tablet by mouth two times a day    Maxzide 75-50 Mg Tabs (Triamterene-hctz) .Marland Kitchen... Take 1 tablet by mouth once a day    Clonidine Hcl 0.3 Mg Tabs (Clonidine hcl) ..... One and a half at bedtime  BP today: 138/90, pt delayed taking one of her meds today Prior BP: 120/80 (10/12/2009)  Labs  Reviewed: K+: 4.1 (01/02/2007) Creat: : 0.72 (01/02/2007)   Chol: 173 (01/02/2007)   HDL: 63 (01/02/2007)   LDL: 97 (01/02/2007)   TG: 67 (01/02/2007)  Complete Medication List: 1)  Amlodipine Besylate 10 Mg Tabs (Amlodipine besylate) .... Take 1 tablet by mouth once a day 2)  Loratadine 10 Mg Tabs (Loratadine) .... One tab by mouth qd 3)  Clonidine Hcl 0.3 Mg Tabs (Clonidine hcl) .... Take 1 tablet by mouth two times a take 1 tablet by mouth two times a day 4)  Omeprazole 40 Mg Cpdr (Omeprazole) .... Take 1 capsule by mouth once a day 5)  Warfarin Sodium 2 Mg Tabs (Warfarin sodium) .... Uad 6)  Maxzide 75-50 Mg  Tabs (Triamterene-hctz) .... Take 1 tablet by mouth once a day 7)  Amlodipine Besylate 10 Mg Tabs (Amlodipine besylate) .... Take 1 tablet by mouth once a day 8)  Clonidine Hcl 0.3 Mg Tabs (Clonidine hcl) .... One and a half at bedtime 9)  Zyrtec Hives Relief 10 Mg Tabs (Cetirizine hcl) .... Take 1 tablet by mouth once a day 10)  Xanax 0.5 Mg Tabs (Alprazolam) .... Take 1 tablet by mouth three times a day 11)  Warfarin Sodium 5 Mg Tabs (Warfarin sodium) .... Uad 12)  Ferrous Sulfate 325 (65 Fe) Mg Tbec (Ferrous sulfate) .... Take 1 tablet by mouth once a day 13)  Oxycodone Hcl 10 Mg Tabs (Oxycodone hcl) .... Take 1 tab every 6 hrs as needed 14)  Vicodin 5-500 Mg Tabs (Hydrocodone-acetaminophen) .... One tab q 4 hrs as needed 15)  Vistaril 25 Mg Caps (Hydroxyzine pamoate) .... Take 1 capsule by mouth three times a day as needed 16)  Penicillin V Potassium 500 Mg Tabs (Penicillin v potassium) .... One tab by mouth tid 17)  Tessalon Perles 100 Mg Caps (Benzonatate) .... One cap by mouth three times a day 18)  Fluconazole 150 Mg Tabs (Fluconazole) .... One tab by mouth once daily prn  Patient Instructions: 1)  Please schedule a follow-up appointment in 6 weeks. 2)  Your blood pressure is much better, congrats. 3)  Yiou are also doing better. 4)  Iwill refill mental health med for 1  month only, pLS keep appt  on January 25 as before. Prescriptions: FLUCONAZOLE 150 MG TABS (FLUCONAZOLE) one tab by mouth once daily prn  #3 x 0   Entered by:   Worthy Keeler LPN   Authorized by:   Syliva Overman MD   Signed by:   Worthy Keeler LPN on 02/72/5366   Method used:   Electronically to        [facility] [phone]* (retail)       [address]       [place], [place]  [address]       Ph: [phone]       Fax: [phone]   RxID:   [id number] TESSALON PERLES 100 MG CAPS (BENZONATATE) one cap by mouth three times a day  #30 x 0   Entered by:   Worthy Keeler LPN   Authorized by:   Syliva Overman MD   Signed by:   Worthy Keeler LPN on 73/22/0254   Method used:   Electronically to        [facility] [phone]* (retail)       [address]       [place], [place]  [address]       Ph: [phone]       Fax: [phone]   RxID:   [id number] PENICILLIN V POTASSIUM 500 MG TABS (PENICILLIN V POTASSIUM) one tab by mouth tid  #30 x 0   Entered by:   Worthy Keeler LPN   Authorized by:   Syliva Overman MD   Signed by:   Worthy Keeler LPN on 29/93/7169   Method used:   Electronically to        [facility] [phone]* (retail)       [address]       [place], [place]  [address]       Ph: [phone]       Fax: [phone]   RxID:   [id number] VISTARIL 25 MG CAPS (HYDROXYZINE PAMOATE) Take 1 capsule by  mouth three times a day as needed  #90 x 0   Entered and Authorized by:   Syliva Overman MD   Signed by:   Syliva Overman MD on 11/10/2009   Method used:   Electronically to        [facility] [phone]* (retail)       [address]       [place], [place]  [address]       Ph: [phone]       Fax: [phone]   RxID:   [id number]

## 2010-12-04 NOTE — Assessment & Plan Note (Signed)
Summary: F UP   Vital Signs:  Patient profile:   39 year old female Menstrual status:  regular Height:      63 inches Weight:      160.75 pounds BMI:     28.58 O2 Sat:      96 % on Room air Pulse rate:   111 / minute Pulse rhythm:   regular Resp:     16 per minute BP sitting:   128 / 82  (left arm)  Vitals Entered By: Adella Hare LPN (June 21, 2010 3:38 PM)  Nutrition Counseling: [name] BMI is greater than 25 and therefore counseled on weight management options.  O2 Flow:  Room air CC: follow-up visit Is Patient Diabetic? No   Primary Care Provider:  Syliva Overman MD  CC:  follow-up visit.  History of Present Illness: Pt doing fairly well. She is in the process of relocating to [facility] and will live with her boyfriend. She feels less weak and light headedthan she had in the past, last week she had to go to the ED for fluids. sh will now need to find a mental health caregiver in this county , and though we tried to directly refer her , the msg is that she will need to make herown appt.She denies suicidal or homicidal ideation, but does c/o severe depression and anxiety.  Current Medications (verified): 1)  Amlodipine Besylate 10 Mg Tabs (Amlodipine Besylate) .... Take 1 Tablet By Mouth Once A Day 2)  Zyrtec Hives Relief 10 Mg Tabs (Cetirizine Hcl) .... Take 1 Tablet By Mouth Once A Day 3)  Xanax 0.5 Mg Tabs (Alprazolam) .... Take 1 Tablet By Mouth Three Times A Day 4)  Ferrous Sulfate 325 (65 Fe) Mg Tbec (Ferrous Sulfate) .... Take 1 Tablet By Mouth Once A Day 5)  Oxycodone Hcl 10 Mg Tabs (Oxycodone Hcl) .... Take 1 Tab Every 6 Hrs As Needed 6)  Ventolin Hfa 108 (90 Base) Mcg/act Aers (Albuterol Sulfate) .... 2 Puffs Two Times A Day 7)  Prozac 20 Mg Caps (Fluoxetine Hcl) .... 3 Tabs Q Am 8)  Klor-Con M20 20 Meq Cr-Tabs (Potassium Chloride Crys Cr) .... One Tab By Mouth Three Times A Day 9)  B-100 Complex  Tabs (Vitamins-Lipotropics) .... Take 1 Tablet By  Mouth Once A Day 10)  Miralax  Powd (Polyethylene Glycol 3350) .Marland Kitchen.. 17gm Daily 11)  Geodon 20 Mg Caps (Ziprasidone Hcl) .... 2 in The Morning, and 1 At Bedtime 12)  Senokot S 8.6-50 Mg Tabs (Sennosides-Docusate Sodium) .... Take 1 Tablet By Mouth Two Times A Day 13)  Albuterol Sulfate (2.5 Mg/59ml) 0.083% Nebu (Albuterol Sulfate) .... Use On Vial Per Nebulizer Every 6 Hours As Needed For Wheezing 14)  Ipratropium Bromide 0.02 % Soln (Ipratropium Bromide) .... Use One Vial Per Nebulizer Every 6 Hours As Needed For Wheezing 15)  Cromolyn Sodium 4 % Soln (Cromolyn Sodium) .... One To Two Drops To Affected Eyes  Four Times Daily As Needed 16)  Bactroban 2 % Oint (Mupirocin) .... Apply Twice Daily As Neededfor 1 Week  Allergies (verified): 1)  ! Pcn  Review of Systems      See HPI General:  Complains of fatigue and sleep disorder. Eyes:  Denies blurring and discharge. ENT:  Denies hoarseness and nasal congestion. CV:  Denies chest pain or discomfort, difficulty breathing while lying down, palpitations, and swelling of feet. Resp:  Denies cough and sputum productive. MS:  Complains of joint pain, low back pain, mid  back pain, and muscle weakness. Psych:  Complains of anxiety and depression. Allergy:  Denies hives or rash and itching eyes.  Physical Exam  General:  Well-developed,well-nourished,in no acute distress; alert,appropriate and cooperative throughout examination HEENT: No facial asymmetry,  EOMI, No sinus tenderness, TM's Clear, oropharynx  pink and moist.   Chest: Clear to auscultation bilaterally.  CVS: S1, S2, No murmurs, No S3.   Abd: Soft, Nontender.  MS: decreased  ROM spine,adequate in  hips, shoulders and knees.  Ext: No edema.   CNS: CN 2-12 intact,  tone and sensation normal throughout. Reduced power in lwer extremeties  Skin: Intact, no visible lesions or rashes.  Psych: Good eye contact, normal affect.  Memory intact, not anxious or depressed  appearing.    Impression & Recommendations:  Problem # 1:  DEPRESSION, CHRONIC (ICD-311) Assessment Unchanged  Her updated medication list for this problem includes:    Xanax 0.5 Mg Tabs (Alprazolam) .Marland Kitchen... Take 1 tablet by mouth three times a day    Prozac 20 Mg Caps (Fluoxetine hcl) .Marland KitchenMarland KitchenMarland KitchenMarland Kitchen 3 tabs q am  Future Orders: Psychiatric Referral (Psych) ... 06/22/2010  Problem # 2:  MIGRAINE HEADACHE (ICD-346.90) Assessment: Improved  Her updated medication list for this problem includes:    Oxycodone Hcl 10 Mg Tabs (Oxycodone hcl) .Marland Kitchen... Take 1 tab every 6 hrs as needed, pt to wean off of codeine products entirely  Problem # 3:  ESSENTIAL HYPERTENSION (ICD-401.9) Assessment: Improved  The following medications were removed from the medication list:    Furosemide 20 Mg Tabs (Furosemide) .Marland Kitchen... Take 1 tablet by mouth once a day as needed    Clonidine Hcl 0.2 Mg Tabs (Clonidine hcl) .Marland Kitchen... Take 1 tablet by mouth once a day Her updated medication list for this problem includes:    Amlodipine Besylate 10 Mg Tabs (Amlodipine besylate) .Marland Kitchen... Take 1 tablet by mouth once a day  Orders: T-Basic Metabolic Panel [phone])  BP today: 128/82 Prior BP: 80/50 (06/12/2010)  Labs Reviewed: K+: 3.8 (03/01/2010) Creat: : 0.88 (03/01/2010)   Chol: 173 (01/02/2007)   HDL: 63 (01/02/2007)   LDL: 97 (01/02/2007)   TG: 67 (01/02/2007)  Complete Medication List: 1)  Amlodipine Besylate 10 Mg Tabs (Amlodipine besylate) .... Take 1 tablet by mouth once a day 2)  Zyrtec Hives Relief 10 Mg Tabs (Cetirizine hcl) .... Take 1 tablet by mouth once a day 3)  Xanax 0.5 Mg Tabs (Alprazolam) .... Take 1 tablet by mouth three times a day 4)  Ferrous Sulfate 325 (65 Fe) Mg Tbec (Ferrous sulfate) .... Take 1 tablet by mouth once a day 5)  Oxycodone Hcl 10 Mg Tabs (Oxycodone hcl) .... Take 1 tab every 6 hrs as needed 6)  Ventolin Hfa 108 (90 Base) Mcg/act Aers (Albuterol sulfate) .... 2 puffs two times a day 7)   Prozac 20 Mg Caps (Fluoxetine hcl) .... 3 tabs q am 8)  Klor-con M20 20 Meq Cr-tabs (Potassium chloride crys cr) .... One tab by mouth three times a day 9)  B-100 Complex Tabs (Vitamins-lipotropics) .... Take 1 tablet by mouth once a day 10)  Miralax Powd (Polyethylene glycol 3350) .Marland Kitchen.. 17gm daily 11)  Geodon 20 Mg Caps (Ziprasidone hcl) .... 2 in the morning, and 1 at bedtime 12)  Senokot S 8.6-50 Mg Tabs (Sennosides-docusate sodium) .... Take 1 tablet by mouth two times a day 13)  Albuterol Sulfate (2.5 Mg/56ml) 0.083% Nebu (Albuterol sulfate) .... Use on vial per nebulizer every 6 hours as needed for  wheezing 14)  Ipratropium Bromide 0.02 % Soln (Ipratropium bromide) .... Use one vial per nebulizer every 6 hours as needed for wheezing 15)  Cromolyn Sodium 4 % Soln (Cromolyn sodium) .... One to two drops to affected eyes  four times daily as needed 16)  Bactroban 2 % Oint (Mupirocin) .... Apply twice daily as neededfor 1 week  Patient Instructions: 1)  Please schedule a follow-up appointment in 1 month. 2)  stop potassium and rept chem7  in 1 week next thursday 3)  pls start weaning off of the oxycodone at night, hopefully in 3 weeks you will not need it. 4)  you need to find a therpist re your mental health in [place] asap 5)  bP is 120/80...great

## 2010-12-04 NOTE — Progress Notes (Signed)
Summary: STATEMENT  Phone Note Call from Patient   Summary of Call: CATHERYN FROM [facility] CALLED AND THINKS SHE WILL NEED A STAEMENT ABOUT HER HEALTH PROBLEMS FOR HER TO RECEIVE THE HELP SHE NEEDS  FAX TO [phone] Initial call taken by: Lind Guest,  February 12, 2010 3:38 PM  Follow-up for Phone Call        I already filled out a form for [employer] and for [facility]. If I need to send a note to this lady too just let me know what to write. Follow-up by: Everitt Amber LPN,  February 13, 2010 3:37 PM  Additional Follow-up for Phone Call Additional follow up Details #1::        pls find out if this is a different company , is this the person [place] wants etcand let me know Additional Follow-up by: Syliva Overman MD,  February 13, 2010 5:00 PM    Additional Follow-up for Phone Call Additional follow up Details #2::    everything has been completed, [facility] just needs a statement saying how bad her medical conditions and need is for pcs services. this will help get her the hours she needs Follow-up by: Adella Hare LPN,  February 20, 2010 3:12 PM  Additional Follow-up for Phone Call Additional follow up Details #3:: Details for Additional Follow-up Action Taken: letter written, pls type and fax Additional Follow-up by: Syliva Overman MD,  February 21, 2010 12:07 PM  letter has been typed and faxed Adella Hare LPN  February 21, 2010 12:18 PM

## 2010-12-04 NOTE — Assessment & Plan Note (Signed)
Summary: F UP   Vital Signs:  Patient profile:   40 year old female Menstrual status:  regular Height:      63 inches Weight:      177.75 pounds BMI:     31.60 O2 Sat:      98 % Pulse rate:   77 / minute Pulse rhythm:   regular Resp:     16 per minute BP sitting:   120 / 80  (left arm) Cuff size:   large  Vitals Entered By: Everitt Amber LPN (January 17, 2010 3:52 PM)  Nutrition Counseling: [name] BMI is greater than 25 and therefore counseled on weight management options. CC: Follow up chronic problems   Primary Care Provider:  Syliva Overman MD  CC:  Follow up chronic problems.  History of Present Illness: Pt continues to experience depression and chronic pain with reduced mobility. She has now had her license revoked essentially by the neurosurgeon and is unable to drive. She ahs 3handicap stickers to be placed on 3 of the 5 car s sheowns. She did see psychology oin [place] sttaes her privacy was violated , and has asked for another venue. She denies epistaxis, hematuria, or hemoptysis orrectal blood. She c/o haairless, and of cracking anfd dryingof her fingertips , states she feels dry all over.  Current Medications (verified): 1)  Loratadine 10 Mg Tabs (Loratadine) .... One Tab By Mouth Qd 2)  Amlodipine Besylate 10 Mg Tabs (Amlodipine Besylate) .... Take 1 Tablet By Mouth Once A Day 3)  Clonidine Hcl 0.3 Mg Tabs (Clonidine Hcl) .... One and A Half At Bedtime 4)  Zyrtec Hives Relief 10 Mg Tabs (Cetirizine Hcl) .... Take 1 Tablet By Mouth Once A Day 5)  Xanax 0.5 Mg Tabs (Alprazolam) .... Take 1 Tablet By Mouth Three Times A Day 6)  Ferrous Sulfate 325 (65 Fe) Mg Tbec (Ferrous Sulfate) .... Take 1 Tablet By Mouth Once A Day 7)  Oxycodone Hcl 10 Mg Tabs (Oxycodone Hcl) .... Take 1 Tab Every 6 Hrs As Needed 8)  Vicodin 5-500 Mg Tabs (Hydrocodone-Acetaminophen) .... One Tab Q 4 Hrs As Needed 9)  Vistaril 25 Mg Caps (Hydroxyzine Pamoate) .... Take 1 Capsule By Mouth Three  Times A Day As Needed 10)  Gabapentin 300 Mg Caps (Gabapentin) .... One Cap By Mouth Bid 11)  Warfarin Sodium 2 Mg Tabs (Warfarin Sodium) .... Uad 12)  Warfarin Sodium 5 Mg Tabs (Warfarin Sodium) .... Uad 13)  Diazepam 5 Mg Tabs (Diazepam) .... One Tab Q 8 Hrs As Needed 14)  Ventolin Hfa 108 (90 Base) Mcg/act Aers (Albuterol Sulfate) .... 2 Puffs Two Times A Day 15)  Nystatin 100000 Unit/ml Susp (Nystatin) .... Swish and Spit 4-66ml Four Times Per Day  Allergies (verified): No Known Drug Allergies  Review of Systems      See HPI General:  Complains of malaise and weakness; denies chills and fever. Eyes:  Denies blurring and discharge. ENT:  Denies nasal congestion, sinus pressure, and sore throat. CV:  Denies chest pain or discomfort, palpitations, and swelling of feet. Resp:  Denies cough, sputum productive, and wheezing. GI:  Denies abdominal pain, constipation, diarrhea, nausea, and vomiting. GU:  Complains of abnormal vaginal bleeding; denies dysuria and urinary frequency; c/o severe dysmenorreah, this is day 1 of her cycle, already on lg doses of pain meds. MS:  Complains of joint pain, low back pain, mid back pain, muscle weakness, and stiffness. Derm:  Complains of dryness, hair loss, itching, and rash.  Psych:  Complains of anxiety, depression, easily angered, easily tearful, irritability, and mental problems; denies suicidal thoughts/plans, thoughts of violence, and unusual visions or sounds; did not tolerate pristiq, requests abilify, statesit helped her in thae past, she just could not afford it. Endo:  Complains of excessive thirst. Heme:  Denies abnormal bruising and bleeding. Allergy:  Denies hives or rash.  Physical Exam  General:  Well-developed,well-nourished,in no acute distress; alert,appropriate and cooperative throughout examination HEENT: No facial asymmetry,  EOMI, No sinus tenderness, TM's Clear, oropharynx  pink and moist.   Chest: Clear to auscultation  bilaterally.  CVS: S1, S2, No murmurs, No S3.   Abd: Soft, Nontender.  MS: decreased  ROM spine, hips,  and knees. pt walks with an assistive device Ext: No edema.   CNS: CN 2-12 intact, power tone and sensation normal throughout.   Skin: Intact,dry skin with cracking at fingertips.  Psych: Good eye contact, normal affect.  Memory intact, not anxious or depressed appearing.    Impression & Recommendations:  Problem # 1:  BACK PAIN WITH RADICULOPATHY (ICD-729.2) Assessment Unchanged  Problem # 2:  DEPRESSION, CHRONIC (ICD-311) Assessment: Unchanged  The following medications were removed from the medication list:    Pristiq 50 Mg Xr24h-tab (Desvenlafaxine succinate) .Marland Kitchen... Take 1 tablet by mouth once a day Her updated medication list for this problem includes:    Xanax 0.5 Mg Tabs (Alprazolam) .Marland Kitchen... Take 1 tablet by mouth three times a day    Vistaril 25 Mg Caps (Hydroxyzine pamoate) .Marland Kitchen... Take 1 capsule by mouth three times a day as needed    Diazepam 5 Mg Tabs (Diazepam) ..... One tab q 8 hrs as needed  Problem # 3:  ESSENTIAL HYPERTENSION (ICD-401.9) Assessment: Unchanged  Her updated medication list for this problem includes:    Amlodipine Besylate 10 Mg Tabs (Amlodipine besylate) .Marland Kitchen... Take 1 tablet by mouth once a day    Clonidine Hcl 0.3 Mg Tabs (Clonidine hcl) ..... One and a half at bedtime  BP today: 120/80 Prior BP: 110/80 (12/20/2009)  Labs Reviewed: K+: 4.1 (01/02/2007) Creat: : 0.72 (01/02/2007)   Chol: 173 (01/02/2007)   HDL: 63 (01/02/2007)   LDL: 97 (01/02/2007)   TG: 67 (01/02/2007)  Problem # 4:  DYSMENORRHEA (ICD-625.3) Assessment: Deteriorated  Complete Medication List: 1)  Loratadine 10 Mg Tabs (Loratadine) .... One tab by mouth qd 2)  Amlodipine Besylate 10 Mg Tabs (Amlodipine besylate) .... Take 1 tablet by mouth once a day 3)  Clonidine Hcl 0.3 Mg Tabs (Clonidine hcl) .... One and a half at bedtime 4)  Zyrtec Hives Relief 10 Mg Tabs (Cetirizine  hcl) .... Take 1 tablet by mouth once a day 5)  Xanax 0.5 Mg Tabs (Alprazolam) .... Take 1 tablet by mouth three times a day 6)  Ferrous Sulfate 325 (65 Fe) Mg Tbec (Ferrous sulfate) .... Take 1 tablet by mouth once a day 7)  Oxycodone Hcl 10 Mg Tabs (Oxycodone hcl) .... Take 1 tab every 6 hrs as needed 8)  Vicodin 5-500 Mg Tabs (Hydrocodone-acetaminophen) .... One tab q 4 hrs as needed 9)  Vistaril 25 Mg Caps (Hydroxyzine pamoate) .... Take 1 capsule by mouth three times a day as needed 10)  Gabapentin 300 Mg Caps (Gabapentin) .... One cap by mouth bid 11)  Warfarin Sodium 2 Mg Tabs (Warfarin sodium) .... Uad 12)  Warfarin Sodium 5 Mg Tabs (Warfarin sodium) .... Uad 13)  Diazepam 5 Mg Tabs (Diazepam) .... One tab q 8 hrs as  needed 14)  Ventolin Hfa 108 (90 Base) Mcg/act Aers (Albuterol sulfate) .... 2 puffs two times a day 15)  Nystatin 100000 Unit/ml Susp (Nystatin) .... Swish and spit 4-30ml four times per day 16)  Abilify 2 Mg Tabs (Aripiprazole) .... Take 1 tablet by mouth once a day  Patient Instructions: 1)  Please schedule a follow-up appointment in 1 month. 2)  Abilify is sent in. No other med changes at this time. 3)  pls do not use chemicals in your hair, shampoo, conditioner and greese/moisturizer are enough. Prescriptions: ABILIFY 2 MG TABS (ARIPIPRAZOLE) Take 1 tablet by mouth once a day  #30 x 1   Entered and Authorized by:   Syliva Overman MD   Signed by:   Syliva Overman MD on 01/17/2010   Method used:   Electronically to        [facility] [phone]* (retail)       [address]       [place], [place]  [address]       Ph: [phone]       Fax: [phone]   RxID:   [id number]

## 2010-12-04 NOTE — Letter (Signed)
Summary: CCME REQUEST  CCME REQUEST   Imported By: Lind Guest 02/14/2010 15:38:21  _____________________________________________________________________  External Attachment:    Type:   Image     Comment:   External Document

## 2010-12-04 NOTE — Letter (Signed)
Summary: rx assistance  rx assistance   Imported By: Lind Guest 09/03/2010 08:55:26  _____________________________________________________________________  External Attachment:    Type:   Image     Comment:   External Document

## 2010-12-04 NOTE — Assessment & Plan Note (Signed)
Summary: OV   Vital Signs:  Patient profile:   40 year old female Menstrual status:  regular Height:      63 inches Weight:      195.50 pounds BMI:     34.76 O2 Sat:      96 % Temp:     12 degrees F Pulse rate:   75 / minute Pulse rhythm:   regular Resp:     16 per minute BP sitting:   110 / 60  (left arm)  Vitals Entered By: Everitt Amber LPN (February 07, 8118 9:50 AM)  Nutrition Counseling: [name] BMI is greater than 25 and therefore counseled on weight management options. CC: very disoriented, slurred speech, falling asleep while talking. Since taking the prozac    Primary Care Provider:  Syliva Overman MD  CC:  very disoriented, slurred speech, and falling asleep while talking. Since taking the prozac .  History of Present Illness: pt brought in by her son who states he last saw his mother several days ago. She has recently established care with psychiatry, and by history, it appears that the medication she was started on along with what she is already taking caused acute and severe deterioration in her level of alertness and functioning. There is no clear h/o intentional overdose. Of note , in recent times the pt has been calling requesting an aide, which she clearly needs, she ambulates with a device , and would need help wih med management and peronal care.   Current Medications (verified): 1)  Loratadine 10 Mg Tabs (Loratadine) .... One Tab By Mouth Qd 2)  Amlodipine Besylate 10 Mg Tabs (Amlodipine Besylate) .... Take 1 Tablet By Mouth Once A Day 3)  Clonidine Hcl 0.3 Mg Tabs (Clonidine Hcl) .... One and A Half At Bedtime 4)  Zyrtec Hives Relief 10 Mg Tabs (Cetirizine Hcl) .... Take 1 Tablet By Mouth Once A Day 5)  Xanax 0.5 Mg Tabs (Alprazolam) .... Take 1 Tablet By Mouth Three Times A Day 6)  Ferrous Sulfate 325 (65 Fe) Mg Tbec (Ferrous Sulfate) .... Take 1 Tablet By Mouth Once A Day 7)  Oxycodone Hcl 10 Mg Tabs (Oxycodone Hcl) .... Take 1 Tab Every 6 Hrs As  Needed 8)  Vicodin 5-500 Mg Tabs (Hydrocodone-Acetaminophen) .... One Tab Q 4 Hrs As Needed 9)  Vistaril 25 Mg Caps (Hydroxyzine Pamoate) .... Take 1 Capsule By Mouth Three Times A Day As Needed 10)  Gabapentin 300 Mg Caps (Gabapentin) .... One Cap By Mouth Bid 11)  Warfarin Sodium 2 Mg Tabs (Warfarin Sodium) .... Uad 12)  Warfarin Sodium 5 Mg Tabs (Warfarin Sodium) .... Uad 13)  Diazepam 5 Mg Tabs (Diazepam) .... One Tab Q 8 Hrs As Needed 14)  Ventolin Hfa 108 (90 Base) Mcg/act Aers (Albuterol Sulfate) .... 2 Puffs Two Times A Day 15)  Nystatin 100000 Unit/ml Susp (Nystatin) .... Swish and Spit 4-43ml Four Times Per Day 16)  Abilify 2 Mg Tabs (Aripiprazole) .... Take 1 Tablet By Mouth Once A Day 17)  Prozac 20 Mg Caps (Fluoxetine Hcl) .... Take 1 Tablet By Mouth Three Times A Day 18)  Gabapentin 300 Mg Caps (Gabapentin) .... 3 At Bedtime  Allergies (verified): No Known Drug Allergies  Review of Systems General:  Complains of fatigue, loss of appetite, malaise, sleep disorder, and weakness; denies chills and fever. ENT:  Denies hoarseness, nasal congestion, and sinus pressure. CV:  Denies chest pain or discomfort, difficulty breathing while lying down, and palpitations. Resp:  Denies cough and sputum productive. Derm:  Denies lesion(s) and rash. Neuro:  Complains of inability to speak, memory loss, and poor balance. Psych:  Complains of anxiety, depression, irritability, mental problems, sense of great danger, and unusual visions or sounds; denies suicidal thoughts/plans and thoughts of violence; reportedly was recently started on new med from a psych she judst established with . Heme:  Complains of abnormal bruising; pt on coumadin for dVT since Dec.  Physical Exam  General:  well-nourished.  Pt extremely drowsy and barely coherent, unable to answer questions asked. Heent: No facial assymetry, EOMI, sinuses non tender Neck supple, no adenopathy. chest: clear CVS: S1 and S2 no murmurs   CNS;pt drowsy and obviously overmedicated. No focal neurologic deficits on exam    Impression & Recommendations:  Problem # 1:  ALTERED MENTAL STATUS (ICD-780.97) Assessment Deteriorated pt taken directly to the ED  Problem # 2:  DEPRESSION, CHRONIC (ICD-311) Assessment: Deteriorated  Her updated medication list for this problem includes:    Xanax 0.5 Mg Tabs (Alprazolam) .Marland Kitchen... Take 1 tablet by mouth three times a day    Vistaril 25 Mg Caps (Hydroxyzine pamoate) .Marland Kitchen... Take 1 capsule by mouth three times a day as needed    Diazepam 5 Mg Tabs (Diazepam) ..... One tab q 8 hrs as needed    Prozac 20 Mg Caps (Fluoxetine hcl) .Marland Kitchen... Take 1 tablet by mouth three times a day  Problem # 3:  ESSENTIAL HYPERTENSION (ICD-401.9) Assessment: Unchanged  Her updated medication list for this problem includes:    Amlodipine Besylate 10 Mg Tabs (Amlodipine besylate) .Marland Kitchen... Take 1 tablet by mouth once a day    Clonidine Hcl 0.3 Mg Tabs (Clonidine hcl) ..... One and a half at bedtime  BP today: 110/60 Prior BP: 120/80 (01/17/2010)  Labs Reviewed: K+: 4.1 (01/02/2007) Creat: : 0.72 (01/02/2007)   Chol: 173 (01/02/2007)   HDL: 63 (01/02/2007)   LDL: 97 (01/02/2007)   TG: 67 (01/02/2007)  Complete Medication List: 1)  Loratadine 10 Mg Tabs (Loratadine) .... One tab by mouth qd 2)  Amlodipine Besylate 10 Mg Tabs (Amlodipine besylate) .... Take 1 tablet by mouth once a day 3)  Clonidine Hcl 0.3 Mg Tabs (Clonidine hcl) .... One and a half at bedtime 4)  Zyrtec Hives Relief 10 Mg Tabs (Cetirizine hcl) .... Take 1 tablet by mouth once a day 5)  Xanax 0.5 Mg Tabs (Alprazolam) .... Take 1 tablet by mouth three times a day 6)  Ferrous Sulfate 325 (65 Fe) Mg Tbec (Ferrous sulfate) .... Take 1 tablet by mouth once a day 7)  Oxycodone Hcl 10 Mg Tabs (Oxycodone hcl) .... Take 1 tab every 6 hrs as needed 8)  Vicodin 5-500 Mg Tabs (Hydrocodone-acetaminophen) .... One tab q 4 hrs as needed 9)  Vistaril 25 Mg Caps  (Hydroxyzine pamoate) .... Take 1 capsule by mouth three times a day as needed 10)  Gabapentin 300 Mg Caps (Gabapentin) .... One cap by mouth bid 11)  Warfarin Sodium 2 Mg Tabs (Warfarin sodium) .... Uad 12)  Warfarin Sodium 5 Mg Tabs (Warfarin sodium) .... Uad 13)  Diazepam 5 Mg Tabs (Diazepam) .... One tab q 8 hrs as needed 14)  Ventolin Hfa 108 (90 Base) Mcg/act Aers (Albuterol sulfate) .... 2 puffs two times a day 15)  Nystatin 100000 Unit/ml Susp (Nystatin) .... Swish and spit 4-56ml four times per day 16)  Abilify 2 Mg Tabs (Aripiprazole) .... Take 1 tablet by mouth once a day 17)  Prozac 20 Mg Caps (Fluoxetine hcl) .... Take 1 tablet by mouth three times a day 18)  Gabapentin 300 Mg Caps (Gabapentin) .... 3 at bedtime  Patient Instructions: 1)  You will be taken directly to the ED for further evaluation . 2)  F/U in  7 to 10 days.

## 2010-12-04 NOTE — Progress Notes (Signed)
  Phone Note Call from Patient   Caller: Patient Summary of Call: pt caLLED REQUESTING "FLUID PILL" THAT SHE FEELS WAS  PRESCRIBED AT TIME OF HER D/C, ON REVIEW OF THE MED LIST, NO DIURETIC IAS LISTED. sHE IS AGAIN REQUESTING HELP WITH REFERRAL FOR ASSISTANCE WHICH I HAD ALREADY REQUESTED LAST WEEK FOR NURSING TO PRSUE, NO MENTION IS MADE AT HOSP D/C FOR THIS AND THE T WOULD BENEFIT FROM THIS.  Initial call taken by: Syliva Overman MD,  February 11, 2010 5:19 PM  Follow-up for Phone Call        Pls notify pt no med for "fluid" on her d/c summary, which i reviewed. Also PLS refer her for home [facility], to do pt/ot eval, nurse foir bp checks and assistance with med management, also assistance with personal care. [facility] is very impt, she has been calling since lasrt week , and i believe she definitely needs help, if neded pl send in a stamped request as wel from me, any questions pls ask, pls call her and let her know what we r doing, I plan to see her Thurs or Fri morning in f/u but this needs to be arranged on mOnday Follow-up by: Syliva Overman MD,  February 11, 2010 5:21 PM  Additional Follow-up for Phone Call Additional follow up Details #1::        Patient aware meds sent in and Marijean Niemann already filled out form to [facility] which is where patient requested Additional Follow-up by: Everitt Amber LPN,  February 12, 2010 12:47 PM

## 2010-12-04 NOTE — Progress Notes (Signed)
  Phone Note Call from Patient   Caller: Patient Summary of Call: may i refer patient for cap services? Initial call taken by: Adella Hare LPN,  February 19, 2010 9:28 AM  Follow-up for Phone Call        yes pLS do so Follow-up by: Syliva Overman MD,  February 19, 2010 9:34 AM  Additional Follow-up for Phone Call Additional follow up Details #1::        left message for susan bailey at [facility] to return my call Additional Follow-up by: Adella Hare LPN,  February 19, 2010 10:38 AM    Additional Follow-up for Phone Call Additional follow up Details #2::    there is an extremely long waiting list for cap services, patient would also have to go through [facility] and they have a longer waiting list than [facility]. we have already completed ccme form for [facility] Follow-up by: Adella Hare LPN,  February 20, 2010 3:13 PM

## 2010-12-04 NOTE — Miscellaneous (Signed)
Summary: Home Care Report  Home Care Report   Imported By: Lind Guest 04/10/2010 10:20:48  _____________________________________________________________________  External Attachment:    Type:   Image     Comment:   External Document

## 2010-12-04 NOTE — Letter (Signed)
Summary: handicapp card  handicapp card   Imported By: Lind Guest 01/18/2010 08:03:14  _____________________________________________________________________  External Attachment:    Type:   Image     Comment:   External Document

## 2010-12-06 NOTE — Progress Notes (Signed)
Summary: medicine  Phone Note Call from Patient   Summary of Call: needs her bp medicine , claritin and needs the pill for a yeast infection send to [facility]  # [phone] if any ? Initial call taken by: Lind Guest,  November 23, 2010 9:38 AM  Follow-up for Phone Call        sent in requested meds, patient needs to use OTC meds (monistat) for yeast  Called patient, no answer Follow-up by: Everitt Amber LPN,  November 23, 2010 2:13 PM  Additional Follow-up for Phone Call Additional follow up Details #1::        Patient said she has already used the OTC Monistat and it didn't work. Wants the fluconazole  [facility] [place] Additional Follow-up by: Everitt Amber LPN,  November 23, 2010 2:41 PM    Additional Follow-up for Phone Call Additional follow up Details #2::    pls erx and advise fluconazole 150mg  #1 tab only Follow-up by: Syliva Overman MD,  November 23, 2010 3:35 PM  Additional Follow-up for Phone Call Additional follow up Details #3:: Details for Additional Follow-up Action Taken: sent in, patient aware Additional Follow-up by: Everitt Amber LPN,  November 23, 2010 4:03 PM  New/Updated Medications: FLUCONAZOLE 150 MG TABS (FLUCONAZOLE) Take 1 tablet by mouth Prescriptions: FLUCONAZOLE 150 MG TABS (FLUCONAZOLE) Take 1 tablet by mouth  #1 x 0   Entered by:   Everitt Amber LPN   Authorized by:   Syliva Overman MD   Signed by:   Everitt Amber LPN on 45/40/9811   Method used:   Electronically to        [facility] (retail)       [address]       [place], [place]  [address]       Ph: [phone]       Fax: [phone]   RxID:   [id number] ZYRTEC HIVES RELIEF 10 MG TABS (CETIRIZINE HCL) Take 1 tablet by mouth once a day  #30 x 2   Entered by:   Everitt Amber LPN   Authorized by:   Syliva Overman MD   Signed by:   Everitt Amber LPN on 66/44/0347   Method used:   Electronically to        [facility] [address] (retail)       [address]       [place], [place]  [address]       Ph: [phone]       Fax: [phone]   RxID:   [id number] AMLODIPINE BESYLATE 10 MG TABS (AMLODIPINE BESYLATE) Take 1 tablet by mouth once a day  #30 Tablet x 2   Entered by:   Everitt Amber LPN   Authorized by:   Syliva Overman MD   Signed by:   Everitt Amber LPN on 22/12/5425   Method used:   Electronically to        [facility] [address] (retail)       [address]       [place], [place]  [address]       Ph: [phone]       Fax: [phone]   RxID:   [id number] ZYRTEC HIVES RELIEF 10 MG TABS (CETIRIZINE HCL) Take 1 tablet by mouth once a day  #30 x 2   Entered by:  Everitt Amber LPN   Authorized by:   Syliva Overman MD   Signed by:   Everitt Amber LPN on 47/42/5956   Method used:   Electronically to        [facility] [phone]* (retail)       [address]       [place], [place]  [address]       Ph: [phone]       Fax: [phone]   RxID:   [id number] AMLODIPINE BESYLATE 10 MG TABS (AMLODIPINE BESYLATE) Take 1 tablet by mouth once a day  #30 Tablet x 2   Entered by:   Everitt Amber LPN   Authorized by:   Syliva Overman MD   Signed by:   Everitt Amber LPN on 37/62/8315   Method used:   Electronically to        [facility] [address] [phone]* (retail)       [address]       [place], [place]  [address]       Ph: [phone]       Fax: [phone]   RxID:   [id number]

## 2010-12-06 NOTE — Miscellaneous (Signed)
Summary: Home Care Report  Home Care Report   Imported By: Lind Guest 11/30/2010 10:02:33  _____________________________________________________________________  External Attachment:    Type:   Image     Comment:   External Document

## 2010-12-06 NOTE — Miscellaneous (Signed)
Summary: Home Care Report  Home Care Report   Imported By: Lind Guest 11/27/2010 09:52:42  _____________________________________________________________________  External Attachment:    Type:   Image     Comment:   External Document

## 2010-12-07 NOTE — Letter (Signed)
Summary: MED LIST  MED LIST   Imported By: Lind Guest 03/01/2010 14:17:55  _____________________________________________________________________  External Attachment:    Type:   Image     Comment:   External Document

## 2011-01-02 ENCOUNTER — Encounter: Payer: Self-pay | Admitting: Family Medicine

## 2011-01-02 ENCOUNTER — Ambulatory Visit (INDEPENDENT_AMBULATORY_CARE_PROVIDER_SITE_OTHER): Payer: Medicaid Other | Admitting: Family Medicine

## 2011-01-02 DIAGNOSIS — I10 Essential (primary) hypertension: Secondary | ICD-10-CM

## 2011-01-02 DIAGNOSIS — N3000 Acute cystitis without hematuria: Secondary | ICD-10-CM | POA: Insufficient documentation

## 2011-01-02 DIAGNOSIS — F411 Generalized anxiety disorder: Secondary | ICD-10-CM

## 2011-01-02 LAB — CONVERTED CEMR LAB
Ketones, urine, test strip: NEGATIVE
Nitrite: NEGATIVE
Specific Gravity, Urine: >=1.03
Urobilinogen, UA: 0.2

## 2011-01-03 ENCOUNTER — Encounter: Payer: Self-pay | Admitting: Family Medicine

## 2011-01-03 LAB — CONVERTED CEMR LAB: Chlamydia, DNA Probe: NEGATIVE

## 2011-01-04 LAB — CONVERTED CEMR LAB: Gardnerella vaginalis: POSITIVE — AB

## 2011-01-06 DIAGNOSIS — N771 Vaginitis, vulvitis and vulvovaginitis in diseases classified elsewhere: Secondary | ICD-10-CM | POA: Insufficient documentation

## 2011-01-07 ENCOUNTER — Encounter: Payer: Self-pay | Admitting: Family Medicine

## 2011-01-15 NOTE — Letter (Signed)
Summary: dose reduction  dose reduction   Imported By: Lind Guest 01/07/2011 12:54:53  _____________________________________________________________________  External Attachment:    Type:   Image     Comment:   External Document

## 2011-01-15 NOTE — Assessment & Plan Note (Signed)
Summary: follow up   Vital Signs:  Patient profile:   40 year old female Menstrual status:  regular Height:      63 inches Weight:      194 pounds BMI:     34.49 O2 Sat:      98 % Pulse rate:   84 / minute Pulse rhythm:   regular Resp:     16 per minute BP sitting:   120 / 80  (left arm) Cuff size:   large  Vitals Entered By: Everitt Amber LPN (January 02, 2011 11:08 AM)  Nutrition Counseling: [name] BMI is greater than 25 and therefore counseled on weight management options. CC: sinus draiange, cough, able to bring up small amount of clear-yellow drainage, having creamy vaginal discharge and itchy. Feels like yeast infection. Also having left flank pain for about a month. Has been having alot of fear and anxiety because she is gonna be moving out soon   Primary Care Provider:  Syliva Overman MD  CC:  sinus draiange, cough, able to bring up small amount of clear-yellow drainage, and having creamy vaginal discharge and itchy. Feels like yeast infection. Also having left flank pain for about a month. Has been having alot of fear and anxiety because she is gonna be moving out soon.  History of Present Illness: Reports  that she is not doing well. Denies recent fever or chills.  Denies chest congestion, or cough productive of sputum. Denies chest pain, palpitations, PND, orthopnea or leg swelling. Denies nausea, vomitting, diarrhea or constipation.Does c/o flank pain Denies change in bowel movements or bloody stool. .Reports malodoros vagina d/c Denies  joint pain, swelling, or reduced mobility. Denies headaches, vertigo, seizures.  Denies  rash, lesions, or itch.     Current Medications (verified): 1)  Amlodipine Besylate 10 Mg Tabs (Amlodipine Besylate) .... Take 1 Tablet By Mouth Once A Day 2)  Zyrtec Hives Relief 10 Mg Tabs (Cetirizine Hcl) .... Take 1 Tablet By Mouth Once A Day 3)  Xanax 0.5 Mg Tabs (Alprazolam) .... Take 1 Tablet By Mouth Three Times A Day 4)   Ventolin Hfa 108 (90 Base) Mcg/act Aers (Albuterol Sulfate) .... 2 Puffs Two Times A Day 5)  Albuterol Sulfate (2.5 Mg/81ml) 0.083% Nebu (Albuterol Sulfate) .... Use On Vial Per Nebulizer Every 6 Hours As Needed For Wheezing 6)  Ipratropium Bromide 0.02 % Soln (Ipratropium Bromide) .... Use One Vial Per Nebulizer Every 6 Hours As Needed For Wheezing  Allergies (verified): 1)  ! Pcn  Review of Systems      See HPI General:  Complains of fatigue. Eyes:  Denies discharge, eye pain, and red eye. ENT:  Complains of nasal congestion, postnasal drainage, and sinus pressure; 2 wek history. GU:  Complains of discharge, dysuria, and urinary frequency; brown vag d/c x 2 months, urinary frequency for weeks. Psych:  Complains of anxiety, depression, irritability, and mental problems; denies suicidal thoughts/plans, thoughts of violence, and unusual visions or sounds; increased symptoms of anxiety and depression, wants to live independently but afraid to. Endo:  Denies cold intolerance, excessive hunger, excessive thirst, and excessive urination. Heme:  Denies abnormal bruising, bleeding, and enlarge lymph nodes. Allergy:  Complains of seasonal allergies; denies hives or rash, itching eyes, and sneezing.  Physical Exam  General:  Well-developed,well-nourished,in no acute distress; alert,appropriate and cooperative throughout examination HEENT: No facial asymmetry,  EOMI, maxillary sinus tenderness, TM's Clear, oropharynx  pink and moist.   Chest: Clear to auscultation bilaterally.  CVS: S1, S2,  No murmurs, No S3.   Abd: Soft, Nontender.  MS: Adequate ROM spine, hips, shoulders and knees.  Ext: No edema.   CNS: CN 2-12 intact, power tone and sensation normal throughout.   Skin: Intact, no visible lesions or rashes.  Psych: poor d eye contact, flat affect.  Memory intact, both ancious and depressed appearing. pelvic : malodoros creamy vag d/c, no cervical motion or adnexal tnderness    Impression &  Recommendations:  Problem # 1:  ACUTE CYSTITIS (ICD-595.0) Assessment Comment Only  Her updated medication list for this problem includes:    Ciprofloxacin Hcl 500 Mg Tabs (Ciprofloxacin hcl) .Marland Kitchen... Take 1 tablet by mouth two times a day    Metronidazole 500 Mg Tabs (Metronidazole) .Marland Kitchen... Take 1 tablet by mouth two times a day  Orders: UA Dipstick W/ Micro (manual) ([address]) T-Culture, Urine ([id number])  Problem # 2:  ANXIETY STATE, UNSPECIFIED (ICD-300.00) Assessment: Deteriorated  The following medications were removed from the medication list:    Xanax 0.5 Mg Tabs (Alprazolam) .Marland Kitchen... Take 1 tablet by mouth three times a day    Buspirone Hcl 7.5 Mg Tabs (Buspirone hcl) .Marland Kitchen... Take 1 tablet by mouth two times a day Her updated medication list for this problem includes:    Alprazolam 0.5 Mg Tabs (Alprazolam) .Marland Kitchen... Take 1 tablet by mouth once at bedtime,dose reduction effective 01/02/2011    Buspirone Hcl 7.5 Mg Tabs (Buspirone hcl) .Marland Kitchen... Take 1 tablet by mouth two times a day  Problem # 3:  IMPAIRED FASTING GLUCOSE (ICD-790.21) Assessment: Comment Only  Orders: T- Hemoglobin A1C ([id number])  Labs Reviewed: Creat: 0.77 (07/19/2010)    pt advised to reduce carb and sweet intake and lose weight to reduce the chanceof becoming diabetic  Problem # 4:  ESSENTIAL HYPERTENSION (ICD-401.9) Assessment: Unchanged  Her updated medication list for this problem includes:    Amlodipine Besylate 10 Mg Tabs (Amlodipine besylate) .Marland Kitchen... Take 1 tablet by mouth once a day  BP today: 120/80 Prior BP: 126/80 (08/30/2010)  Labs Reviewed: K+: 3.9 (07/19/2010) Creat: : 0.77 (07/19/2010)   Chol: 173 (01/02/2007)   HDL: 63 (01/02/2007)   LDL: 97 (01/02/2007)   TG: 67 (01/02/2007)  Problem # 5:  VAGINITIS&VULVOVAGINITIS DISEASES CLASS ELSW (ICD-616.11) Assessment: Comment Only wet prep and cultures sent  Complete Medication List: 1)  Amlodipine Besylate 10 Mg Tabs (Amlodipine besylate) .... Take  1 tablet by mouth once a day 2)  Zyrtec Hives Relief 10 Mg Tabs (Cetirizine hcl) .... Take 1 tablet by mouth once a day 3)  Ventolin Hfa 108 (90 Base) Mcg/act Aers (Albuterol sulfate) .... 2 puffs two times a day 4)  Albuterol Sulfate (2.5 Mg/81ml) 0.083% Nebu (Albuterol sulfate) .... Use on vial per nebulizer every 6 hours as needed for wheezing 5)  Ipratropium Bromide 0.02 % Soln (Ipratropium bromide) .... Use one vial per nebulizer every 6 hours as needed for wheezing 6)  Alprazolam 0.5 Mg Tabs (Alprazolam) .... Take 1 tablet by mouth once at bedtime,dose reduction effective 01/02/2011 7)  Buspirone Hcl 7.5 Mg Tabs (Buspirone hcl) .... Take 1 tablet by mouth two times a day 8)  Ciprofloxacin Hcl 500 Mg Tabs (Ciprofloxacin hcl) .... Take 1 tablet by mouth two times a day 9)  Fluconazole 150 Mg Tabs (Fluconazole) .... Take 1 tablet by mouth once a day as needed for vaginal itching 10)  Metronidazole 500 Mg Tabs (Metronidazole) .... Take 1 tablet by mouth two times a day  Other Orders: T-Wet Prep by Molecular Probe (  [id number]) T-Chlamydia & GC Probe, Genital ([id number])  Patient Instructions: 1)  f/u in 6 to 8 weeks. 2)  It is important that you exercise regularly at least 30 minutes 5 times a week. If you develop chest pain, have severe difficulty breathing, or feel very tired , stop exercising immediately and seek medical attention. 3)  You need to lose weight. Consider a lower calorie diet and regular exercise.  4)  New medication to be started for anxiety, and take your xanax at bedtime only, this will help with sleep 5)  Specimens will be sent for testing, and med is sent in for urinary infection Prescriptions: METRONIDAZOLE 500 MG TABS (METRONIDAZOLE) Take 1 tablet by mouth two times a day  #14 x 0   Entered and Authorized by:   Syliva Overman MD   Signed by:   Syliva Overman MD on 01/04/2011   Method used:   Historical   RxID:   [id number] BUSPIRONE HCL 7.5 MG TABS  (BUSPIRONE HCL) Take 1 tablet by mouth two times a day  #60 x 3   Entered by:   Adella Hare LPN   Authorized by:   Syliva Overman MD   Signed by:   Adella Hare LPN on 84/69/6295   Method used:   Electronically to        [facility] [address] (retail)       [address]       [place], [place]  [address]       Ph: [phone]       Fax: [phone]   RxID:   [id number] FLUCONAZOLE 150 MG TABS (FLUCONAZOLE) Take 1 tablet by mouth once a day as needed for vaginal itching  #3 x 0   Entered and Authorized by:   Syliva Overman MD   Signed by:   Syliva Overman MD on 01/02/2011   Method used:   Electronically to        [facility] [address] (retail)       [address]       [place], [place]  [address]       Ph: [phone]       Fax: [phone]   RxID:   [id number] CIPROFLOXACIN HCL 500 MG TABS (CIPROFLOXACIN HCL) Take 1 tablet by mouth two times a day  #14 x 0   Entered and Authorized by:   Syliva Overman MD   Signed by:   Syliva Overman MD on 01/02/2011   Method used:   Electronically to        [facility] [address] (retail)       [address]       [place], [place]  [address]       Ph: [phone]       Fax: [phone]   RxID:   [id number] BUSPIRONE HCL 7.5 MG TABS (BUSPIRONE HCL) Take 1 tablet by mouth two times a day  #60 x 3   Entered and Authorized by:   Syliva Overman MD   Signed by:   Syliva Overman MD on 01/02/2011   Method used:   Electronically to        [facility] [address] (retail)       [address]       [place], [place]  [address]  Ph: [phone]       Fax: [phone]   RxID:   [id number] ALPRAZOLAM 0.5 MG TABS (ALPRAZOLAM) Take 1 tablet by mouth once at bedtime,dose reduction effective 01/02/2011  #30 x 3   Entered and Authorized by:   Syliva Overman MD   Signed by:   Syliva Overman MD on 01/02/2011   Method used:    Printed then faxed to ...       [facility] [address] (retail)       [address]       [place], [place]  [address]       Ph: [phone]       Fax: [phone]   RxID:   [id number]    Orders Added: 1)  UA Dipstick W/ Micro (manual) [81000] 2)  T-Culture, Urine [[id number]] 3)  Est. Patient Level IV [83151] 4)  T- Hemoglobin A1C [83036-23375] 5)  T-Wet Prep by Molecular Probe [[id number]] 6)  T-Chlamydia & GC Probe, Genital [[id number]]    Laboratory Results   Urine Tests    Routine Urinalysis   Color: lt. yellow Appearance: Hazy Glucose: negative   (Normal Range: Negative) Bilirubin: negative   (Normal Range: Negative) Ketone: negative   (Normal Range: Negative) Spec. Gravity: >=1.030   (Normal Range: 1.003-1.035) Blood: trace-intact   (Normal Range: Negative) pH: 5.5   (Normal Range: 5.0-8.0) Protein: negative   (Normal Range: Negative) Urobilinogen: 0.2   (Normal Range: 0-1) Nitrite: negative   (Normal Range: Negative) Leukocyte Esterace: small   (Normal Range: Negative)

## 2011-01-18 ENCOUNTER — Telehealth: Payer: Self-pay | Admitting: Family Medicine

## 2011-01-18 LAB — CBC
MCH: 33.3 pg (ref 26.0–34.0)
MCHC: 34.6 g/dL (ref 30.0–36.0)
Platelets: 282 10*3/uL (ref 150–400)
RBC: 4.33 MIL/uL (ref 3.87–5.11)

## 2011-01-18 LAB — URINALYSIS, ROUTINE W REFLEX MICROSCOPIC
Bilirubin Urine: NEGATIVE
Bilirubin Urine: NEGATIVE
Glucose, UA: NEGATIVE mg/dL
Hgb urine dipstick: NEGATIVE
Ketones, ur: NEGATIVE mg/dL
Leukocytes, UA: NEGATIVE
Nitrite: NEGATIVE
Protein, ur: 100 mg/dL — AB
Specific Gravity, Urine: 1.01 (ref 1.005–1.030)
pH: 5.5 (ref 5.0–8.0)
pH: 6.5 (ref 5.0–8.0)

## 2011-01-18 LAB — POCT I-STAT, CHEM 8
Calcium, Ion: 1.06 mmol/L — ABNORMAL LOW (ref 1.12–1.32)
Chloride: 103 mEq/L (ref 96–112)
Chloride: 102 mEq/L (ref 96–112)
Glucose, Bld: 94 mg/dL (ref 70–99)
HCT: 40 % (ref 36.0–46.0)
HCT: 45 % (ref 36.0–46.0)
Hemoglobin: 15.3 g/dL — ABNORMAL HIGH (ref 12.0–15.0)
Potassium: 3.1 mEq/L — ABNORMAL LOW (ref 3.5–5.1)
Sodium: 137 mEq/L (ref 135–145)
TCO2: 25 mmol/L (ref 0–100)

## 2011-01-18 LAB — COMPREHENSIVE METABOLIC PANEL
AST: 26 U/L (ref 0–37)
Albumin: 4.8 g/dL (ref 3.5–5.2)
Calcium: 10.2 mg/dL (ref 8.4–10.5)
Creatinine, Ser: 0.8 mg/dL (ref 0.4–1.2)
GFR calc Af Amer: >60 mL/min (ref >60)
GFR calc non Af Amer: >60 mL/min (ref >60)
Total Protein: 8.7 g/dL — ABNORMAL HIGH (ref 6.0–8.3)

## 2011-01-18 LAB — DIFFERENTIAL
Basophils Relative: 0 % (ref 0–1)
Eosinophils Absolute: 0 10*3/uL (ref 0.0–0.7)
Neutro Abs: 9.9 10*3/uL — ABNORMAL HIGH (ref 1.7–7.7)
Neutrophils Relative %: 85 % — ABNORMAL HIGH (ref 43–77)

## 2011-01-18 LAB — URINE CULTURE
Colony Count: NO GROWTH
Culture  Setup Time: 201108100121
Culture: NO GROWTH

## 2011-01-18 LAB — ACETAMINOPHEN LEVEL: Acetaminophen (Tylenol), Serum: <10 ug/mL — ABNORMAL LOW (ref 10–30)

## 2011-01-18 LAB — RAPID URINE DRUG SCREEN, HOSP PERFORMED
Amphetamines: NOT DETECTED
Barbiturates: NOT DETECTED
Benzodiazepines: POSITIVE — AB
Opiates: NOT DETECTED
Tetrahydrocannabinol: NOT DETECTED

## 2011-01-18 LAB — URINE MICROSCOPIC-ADD ON

## 2011-01-18 LAB — POCT PREGNANCY, URINE: Preg Test, Ur: NEGATIVE

## 2011-01-20 LAB — CBC
HCT: 36.3 % (ref 36.0–46.0)
Hemoglobin: 11.6 g/dL — ABNORMAL LOW (ref 12.0–15.0)
MCV: 81.9 fL (ref 78.0–100.0)
RDW: 28.5 % — ABNORMAL HIGH (ref 11.5–15.5)

## 2011-01-20 LAB — DIFFERENTIAL
Basophils Absolute: 0 10*3/uL (ref 0.0–0.1)
Lymphs Abs: 1.6 10*3/uL (ref 0.7–4.0)
Monocytes Absolute: 0.5 10*3/uL (ref 0.1–1.0)
Monocytes Relative: 8 % (ref 3–12)
Neutro Abs: 3.6 10*3/uL (ref 1.7–7.7)

## 2011-01-21 LAB — COMPREHENSIVE METABOLIC PANEL
ALT: 27 U/L (ref 0–35)
Alkaline Phosphatase: 54 U/L (ref 39–117)
CO2: 25 mEq/L (ref 19–32)
GFR calc non Af Amer: >60 mL/min (ref >60)
Glucose, Bld: 92 mg/dL (ref 70–99)
Potassium: 3 mEq/L — ABNORMAL LOW (ref 3.5–5.1)
Sodium: 137 mEq/L (ref 135–145)

## 2011-01-21 LAB — URINALYSIS, ROUTINE W REFLEX MICROSCOPIC
Glucose, UA: NEGATIVE mg/dL
Ketones, ur: NEGATIVE mg/dL
Leukocytes, UA: NEGATIVE
pH: 7 (ref 5.0–8.0)

## 2011-01-21 LAB — DIFFERENTIAL
Basophils Relative: 1 % (ref 0–1)
Eosinophils Absolute: 0.3 10*3/uL (ref 0.0–0.7)
Monocytes Relative: 8 % (ref 3–12)
Neutrophils Relative %: 58 % (ref 43–77)

## 2011-01-21 LAB — PROTIME-INR: INR: 2.31 — ABNORMAL HIGH (ref 0.00–1.49)

## 2011-01-21 LAB — CBC
Hemoglobin: 11.9 g/dL — ABNORMAL LOW (ref 12.0–15.0)
RBC: 3.66 MIL/uL — ABNORMAL LOW (ref 3.87–5.11)

## 2011-01-21 LAB — TRICYCLICS SCREEN, URINE: TCA Scrn: NOT DETECTED

## 2011-01-21 LAB — URINE MICROSCOPIC-ADD ON

## 2011-01-21 LAB — ACETAMINOPHEN LEVEL: Acetaminophen (Tylenol), Serum: <10 ug/mL — ABNORMAL LOW (ref 10–30)

## 2011-01-22 LAB — URINALYSIS, ROUTINE W REFLEX MICROSCOPIC
Glucose, UA: NEGATIVE mg/dL
Ketones, ur: NEGATIVE mg/dL
Specific Gravity, Urine: 1.012 (ref 1.005–1.030)
pH: 6 (ref 5.0–8.0)

## 2011-01-22 LAB — BASIC METABOLIC PANEL
BUN: 6 mg/dL (ref 6–23)
Chloride: 107 mEq/L (ref 96–112)
Creatinine, Ser: 0.74 mg/dL (ref 0.4–1.2)
Glucose, Bld: 94 mg/dL (ref 70–99)
Potassium: 3.5 mEq/L (ref 3.5–5.1)

## 2011-01-22 LAB — BLOOD GAS, ARTERIAL
Acid-Base Excess: 0.5 mmol/L (ref 0.0–2.0)
O2 Saturation: 96.8 %
Patient temperature: 98.6
TCO2: 22.7 mmol/L (ref 0–100)

## 2011-01-22 LAB — APTT: aPTT: 28 seconds (ref 24–37)

## 2011-01-22 LAB — URINE CULTURE
Colony Count: NO GROWTH
Culture: NO GROWTH

## 2011-01-22 LAB — CBC
HCT: 33.5 % — ABNORMAL LOW (ref 36.0–46.0)
Hemoglobin: 11.3 g/dL — ABNORMAL LOW (ref 12.0–15.0)
Hemoglobin: 11.5 g/dL — ABNORMAL LOW (ref 12.0–15.0)
MCHC: 33.2 g/dL (ref 30.0–36.0)
MCHC: 33.8 g/dL (ref 30.0–36.0)
MCV: 94.3 fL (ref 78.0–100.0)
Platelets: 267 10*3/uL (ref 150–400)
Platelets: 282 10*3/uL (ref 150–400)
RBC: 3.68 MIL/uL — ABNORMAL LOW (ref 3.87–5.11)
RDW: 14.2 % (ref 11.5–15.5)
RDW: 14.3 % (ref 11.5–15.5)
RDW: 14.6 % (ref 11.5–15.5)

## 2011-01-22 LAB — ETHANOL: Alcohol, Ethyl (B): <5 mg/dL (ref 0–10)

## 2011-01-22 LAB — COMPREHENSIVE METABOLIC PANEL
ALT: 22 U/L (ref 0–35)
ALT: 24 U/L (ref 0–35)
Albumin: 3.8 g/dL (ref 3.5–5.2)
Alkaline Phosphatase: 54 U/L (ref 39–117)
Alkaline Phosphatase: 57 U/L (ref 39–117)
BUN: 6 mg/dL (ref 6–23)
CO2: 26 mEq/L (ref 19–32)
Calcium: 8.6 mg/dL (ref 8.4–10.5)
Glucose, Bld: 112 mg/dL — ABNORMAL HIGH (ref 70–99)
Potassium: 3.1 mEq/L — ABNORMAL LOW (ref 3.5–5.1)
Potassium: 3.2 mEq/L — ABNORMAL LOW (ref 3.5–5.1)
Sodium: 138 mEq/L (ref 135–145)
Sodium: 141 mEq/L (ref 135–145)
Total Protein: 6.9 g/dL (ref 6.0–8.3)
Total Protein: 7.3 g/dL (ref 6.0–8.3)

## 2011-01-22 LAB — PROTIME-INR
INR: 1.3 (ref 0.00–1.49)
INR: 1.72 — ABNORMAL HIGH (ref 0.00–1.49)
INR: 1.77 — ABNORMAL HIGH (ref 0.00–1.49)
INR: 1.92 — ABNORMAL HIGH (ref 0.00–1.49)
INR: 2.22 — ABNORMAL HIGH (ref 0.00–1.49)
INR: 2.37 — ABNORMAL HIGH (ref 0.00–1.49)
Prothrombin Time: 16.1 seconds — ABNORMAL HIGH (ref 11.6–15.2)
Prothrombin Time: 17 seconds — ABNORMAL HIGH (ref 11.6–15.2)
Prothrombin Time: 17.5 seconds — ABNORMAL HIGH (ref 11.6–15.2)
Prothrombin Time: 17.7 seconds — ABNORMAL HIGH (ref 11.6–15.2)
Prothrombin Time: 21.5 seconds — ABNORMAL HIGH (ref 11.6–15.2)
Prothrombin Time: 21.8 seconds — ABNORMAL HIGH (ref 11.6–15.2)
Prothrombin Time: 24.4 seconds — ABNORMAL HIGH (ref 11.6–15.2)
Prothrombin Time: 25.7 seconds — ABNORMAL HIGH (ref 11.6–15.2)

## 2011-01-22 LAB — RAPID URINE DRUG SCREEN, HOSP PERFORMED
Opiates: NOT DETECTED
Tetrahydrocannabinol: NOT DETECTED

## 2011-01-22 LAB — DIFFERENTIAL
Basophils Relative: 1 % (ref 0–1)
Eosinophils Absolute: 0.4 10*3/uL (ref 0.0–0.7)
Eosinophils Relative: 7 % — ABNORMAL HIGH (ref 0–5)
Monocytes Relative: 9 % (ref 3–12)
Neutrophils Relative %: 44 % (ref 43–77)

## 2011-01-22 LAB — POCT I-STAT, CHEM 8
BUN: 10 mg/dL (ref 6–23)
Calcium, Ion: 1.17 mmol/L (ref 1.12–1.32)
Creatinine, Ser: 0.7 mg/dL (ref 0.4–1.2)
Glucose, Bld: 109 mg/dL — ABNORMAL HIGH (ref 70–99)
TCO2: 25 mmol/L (ref 0–100)

## 2011-01-22 LAB — GLUCOSE, CAPILLARY

## 2011-01-22 LAB — LACTIC ACID, PLASMA: Lactic Acid, Venous: 1.4 mmol/L (ref 0.5–2.2)

## 2011-01-22 LAB — ACETAMINOPHEN LEVEL: Acetaminophen (Tylenol), Serum: <10 ug/mL — ABNORMAL LOW (ref 10–30)

## 2011-01-22 LAB — MAGNESIUM: Magnesium: 2.1 mg/dL (ref 1.5–2.5)

## 2011-01-22 LAB — MRSA PCR SCREENING: MRSA by PCR: NEGATIVE

## 2011-01-22 NOTE — Progress Notes (Signed)
Summary: daughter as a new patient  Phone Note Call from Patient   Summary of Call: wants to know if you will take her daughter as a new patient she needs a tb test and a physical to get a job and needs a doctors name for the medicaid card she is 16. I explained to her that you were not taking new patients and that we have a doctor that is coming in July. But she still wanted me to ask you call back at [phone] to let her know Initial call taken by: Lind Guest,  January 18, 2011 10:02 AM  Follow-up for Phone Call        same response that you already gave her Follow-up by: Syliva Overman MD,  January 18, 2011 11:46 AM  Additional Follow-up for Phone Call Additional follow up Details #1::        patient aware Additional Follow-up by: Lind Guest,  January 18, 2011 4:27 PM

## 2011-01-23 LAB — URINALYSIS, ROUTINE W REFLEX MICROSCOPIC
Glucose, UA: NEGATIVE mg/dL
Hgb urine dipstick: NEGATIVE
Ketones, ur: NEGATIVE mg/dL
Protein, ur: NEGATIVE mg/dL
pH: 6 (ref 5.0–8.0)

## 2011-01-23 LAB — HEPATIC FUNCTION PANEL
ALT: 23 U/L (ref 0–35)
ALT: 24 U/L (ref 0–35)
AST: 22 U/L (ref 0–37)
AST: 24 U/L (ref 0–37)
Albumin: 3.8 g/dL (ref 3.5–5.2)
Albumin: 4 g/dL (ref 3.5–5.2)
Alkaline Phosphatase: 54 U/L (ref 39–117)
Total Protein: 7.1 g/dL (ref 6.0–8.3)
Total Protein: 7.7 g/dL (ref 6.0–8.3)

## 2011-01-23 LAB — CBC
Hemoglobin: 10.8 g/dL — ABNORMAL LOW (ref 12.0–15.0)
MCHC: 34.9 g/dL (ref 30.0–36.0)
Platelets: 241 10*3/uL (ref 150–400)
Platelets: 260 10*3/uL (ref 150–400)
RDW: 15.4 % (ref 11.5–15.5)
RDW: 15.7 % — ABNORMAL HIGH (ref 11.5–15.5)
WBC: 5.6 10*3/uL (ref 4.0–10.5)

## 2011-01-23 LAB — BASIC METABOLIC PANEL
BUN: 9 mg/dL (ref 6–23)
Calcium: 9.1 mg/dL (ref 8.4–10.5)
GFR calc non Af Amer: >60 mL/min (ref >60)
GFR calc non Af Amer: >60 mL/min (ref >60)
Glucose, Bld: 103 mg/dL — ABNORMAL HIGH (ref 70–99)
Potassium: 3.7 mEq/L (ref 3.5–5.1)
Sodium: 138 mEq/L (ref 135–145)
Sodium: 139 mEq/L (ref 135–145)

## 2011-01-23 LAB — URINE CULTURE

## 2011-01-23 LAB — COMPREHENSIVE METABOLIC PANEL
ALT: 19 U/L (ref 0–35)
Calcium: 8.2 mg/dL — ABNORMAL LOW (ref 8.4–10.5)
Glucose, Bld: 117 mg/dL — ABNORMAL HIGH (ref 70–99)
Sodium: 139 mEq/L (ref 135–145)
Total Protein: 6.3 g/dL (ref 6.0–8.3)

## 2011-01-23 LAB — MAGNESIUM: Magnesium: 2.1 mg/dL (ref 1.5–2.5)

## 2011-01-23 LAB — DIFFERENTIAL
Basophils Absolute: 0 10*3/uL (ref 0.0–0.1)
Eosinophils Absolute: 0.6 10*3/uL (ref 0.0–0.7)
Lymphocytes Relative: 33 % (ref 12–46)
Lymphs Abs: 1.3 10*3/uL (ref 0.7–4.0)
Lymphs Abs: 1.9 10*3/uL (ref 0.7–4.0)
Monocytes Relative: 10 % (ref 3–12)
Neutro Abs: 2.2 10*3/uL (ref 1.7–7.7)
Neutro Abs: 2.4 10*3/uL (ref 1.7–7.7)
Neutrophils Relative %: 48 % (ref 43–77)

## 2011-01-23 LAB — PROTIME-INR
INR: 1.87 — ABNORMAL HIGH (ref 0.00–1.49)
INR: 1.9 — ABNORMAL HIGH (ref 0.00–1.49)
INR: 2.3 — ABNORMAL HIGH (ref 0.00–1.49)
Prothrombin Time: 25.1 seconds — ABNORMAL HIGH (ref 11.6–15.2)

## 2011-01-23 LAB — RAPID URINE DRUG SCREEN, HOSP PERFORMED
Barbiturates: NOT DETECTED
Benzodiazepines: POSITIVE — AB
Cocaine: NOT DETECTED

## 2011-01-23 LAB — TSH: TSH: 1.059 u[IU]/mL (ref 0.350–4.500)

## 2011-01-23 LAB — T4, FREE: Free T4: 1.06 ng/dL (ref 0.80–1.80)

## 2011-01-23 LAB — URINE MICROSCOPIC-ADD ON

## 2011-01-23 LAB — ACETAMINOPHEN LEVEL: Acetaminophen (Tylenol), Serum: <10 ug/mL — ABNORMAL LOW (ref 10–30)

## 2011-01-23 LAB — SALICYLATE LEVEL: Salicylate Lvl: <4 mg/dL (ref 2.8–20.0)

## 2011-02-05 LAB — URINALYSIS, ROUTINE W REFLEX MICROSCOPIC
Hgb urine dipstick: NEGATIVE
Nitrite: POSITIVE — AB
Protein, ur: 100 mg/dL — AB
Specific Gravity, Urine: 1.026 (ref 1.005–1.030)
Urobilinogen, UA: 1 mg/dL (ref 0.0–1.0)

## 2011-02-05 LAB — DIFFERENTIAL
Basophils Absolute: 0 10*3/uL (ref 0.0–0.1)
Basophils Relative: 0 % (ref 0–1)
Eosinophils Absolute: 0.3 10*3/uL (ref 0.0–0.7)
Monocytes Absolute: 0.5 10*3/uL (ref 0.1–1.0)
Neutro Abs: 2.6 10*3/uL (ref 1.7–7.7)
Neutrophils Relative %: 55 % (ref 43–77)

## 2011-02-05 LAB — PROTIME-INR
INR: 2.08 — ABNORMAL HIGH (ref 0.00–1.49)
Prothrombin Time: 15.8 seconds — ABNORMAL HIGH (ref 11.6–15.2)
Prothrombin Time: 23.2 seconds — ABNORMAL HIGH (ref 11.6–15.2)
Prothrombin Time: 27.1 seconds — ABNORMAL HIGH (ref 11.6–15.2)

## 2011-02-05 LAB — BASIC METABOLIC PANEL
CO2: 25 mEq/L (ref 19–32)
Calcium: 8.6 mg/dL (ref 8.4–10.5)
Chloride: 99 mEq/L (ref 96–112)
Creatinine, Ser: 0.86 mg/dL (ref 0.4–1.2)
GFR calc Af Amer: >60 mL/min (ref >60)
Glucose, Bld: 100 mg/dL — ABNORMAL HIGH (ref 70–99)

## 2011-02-05 LAB — CBC
MCHC: 31.3 g/dL (ref 30.0–36.0)
MCV: 74.3 fL — ABNORMAL LOW (ref 78.0–100.0)
RBC: 3.82 MIL/uL — ABNORMAL LOW (ref 3.87–5.11)
RDW: 18.5 % — ABNORMAL HIGH (ref 11.5–15.5)

## 2011-02-05 LAB — APTT: aPTT: 37 seconds (ref 24–37)

## 2011-02-05 LAB — URINE MICROSCOPIC-ADD ON

## 2011-02-06 LAB — CBC
HCT: 24 % — ABNORMAL LOW (ref 36.0–46.0)
HCT: 25.1 % — ABNORMAL LOW (ref 36.0–46.0)
HCT: 25.8 % — ABNORMAL LOW (ref 36.0–46.0)
HCT: 26.1 % — ABNORMAL LOW (ref 36.0–46.0)
HCT: 26.8 % — ABNORMAL LOW (ref 36.0–46.0)
HCT: 29.2 % — ABNORMAL LOW (ref 36.0–46.0)
Hemoglobin: 7.7 g/dL — ABNORMAL LOW (ref 12.0–15.0)
Hemoglobin: 8.3 g/dL — ABNORMAL LOW (ref 12.0–15.0)
Hemoglobin: 8.4 g/dL — ABNORMAL LOW (ref 12.0–15.0)
Hemoglobin: 9.4 g/dL — ABNORMAL LOW (ref 12.0–15.0)
MCHC: 32 g/dL (ref 30.0–36.0)
MCHC: 32 g/dL (ref 30.0–36.0)
MCHC: 32.1 g/dL (ref 30.0–36.0)
MCHC: 32.2 g/dL (ref 30.0–36.0)
MCV: 74 fL — ABNORMAL LOW (ref 78.0–100.0)
MCV: 74.1 fL — ABNORMAL LOW (ref 78.0–100.0)
MCV: 74.7 fL — ABNORMAL LOW (ref 78.0–100.0)
MCV: 74.7 fL — ABNORMAL LOW (ref 78.0–100.0)
MCV: 74.8 fL — ABNORMAL LOW (ref 78.0–100.0)
Platelets: 504 10*3/uL — ABNORMAL HIGH (ref 150–400)
RBC: 3.21 MIL/uL — ABNORMAL LOW (ref 3.87–5.11)
RBC: 3.42 MIL/uL — ABNORMAL LOW (ref 3.87–5.11)
RBC: 3.45 MIL/uL — ABNORMAL LOW (ref 3.87–5.11)
RBC: 3.62 MIL/uL — ABNORMAL LOW (ref 3.87–5.11)
RBC: 3.91 MIL/uL (ref 3.87–5.11)
RDW: 18 % — ABNORMAL HIGH (ref 11.5–15.5)
RDW: 18.3 % — ABNORMAL HIGH (ref 11.5–15.5)
RDW: 18.4 % — ABNORMAL HIGH (ref 11.5–15.5)
WBC: 4.1 10*3/uL (ref 4.0–10.5)
WBC: 5.8 10*3/uL (ref 4.0–10.5)
WBC: 7.5 10*3/uL (ref 4.0–10.5)

## 2011-02-06 LAB — PROTIME-INR
INR: 1.17 (ref 0.00–1.49)
INR: 1.2 (ref 0.00–1.49)
INR: 1.25 (ref 0.00–1.49)
INR: 1.65 — ABNORMAL HIGH (ref 0.00–1.49)
INR: 2.19 — ABNORMAL HIGH (ref 0.00–1.49)
INR: 2.51 — ABNORMAL HIGH (ref 0.00–1.49)
INR: 2.6 — ABNORMAL HIGH (ref 0.00–1.49)
INR: 3.04 — ABNORMAL HIGH (ref 0.00–1.49)
Prothrombin Time: 14.7 seconds (ref 11.6–15.2)
Prothrombin Time: 19.4 seconds — ABNORMAL HIGH (ref 11.6–15.2)
Prothrombin Time: 24.7 seconds — ABNORMAL HIGH (ref 11.6–15.2)
Prothrombin Time: 27.3 seconds — ABNORMAL HIGH (ref 11.6–15.2)
Prothrombin Time: 27.6 seconds — ABNORMAL HIGH (ref 11.6–15.2)

## 2011-02-06 LAB — URINALYSIS, ROUTINE W REFLEX MICROSCOPIC
Bilirubin Urine: NEGATIVE
Glucose, UA: NEGATIVE mg/dL
Glucose, UA: NEGATIVE mg/dL
Ketones, ur: NEGATIVE mg/dL
Nitrite: POSITIVE — AB
Nitrite: POSITIVE — AB
Specific Gravity, Urine: 1.016 (ref 1.005–1.030)
pH: 6.5 (ref 5.0–8.0)
pH: 7 (ref 5.0–8.0)

## 2011-02-06 LAB — BASIC METABOLIC PANEL
CO2: 26 mEq/L (ref 19–32)
Chloride: 102 mEq/L (ref 96–112)
Chloride: 103 mEq/L (ref 96–112)
GFR calc Af Amer: >60 mL/min (ref >60)
GFR calc Af Amer: >60 mL/min (ref >60)
Potassium: 3.9 mEq/L (ref 3.5–5.1)
Potassium: 3 mEq/L — ABNORMAL LOW (ref 3.5–5.1)
Sodium: 135 mEq/L (ref 135–145)
Sodium: 136 mEq/L (ref 135–145)

## 2011-02-06 LAB — CULTURE, BLOOD (ROUTINE X 2): Culture: NO GROWTH

## 2011-02-06 LAB — ABO/RH: ABO/RH(D): AB POS

## 2011-02-06 LAB — URINE MICROSCOPIC-ADD ON

## 2011-02-06 LAB — GLUCOSE, CAPILLARY: Glucose-Capillary: 98 mg/dL (ref 70–99)

## 2011-02-06 LAB — TYPE AND SCREEN: ABO/RH(D): AB POS

## 2011-02-10 LAB — BASIC METABOLIC PANEL
Chloride: 104 mEq/L (ref 96–112)
Creatinine, Ser: 0.79 mg/dL (ref 0.4–1.2)
GFR calc Af Amer: >60 mL/min (ref >60)
Potassium: 3.8 mEq/L (ref 3.5–5.1)
Sodium: 137 mEq/L (ref 135–145)

## 2011-02-10 LAB — CBC
MCV: 78.3 fL (ref 78.0–100.0)
RBC: 3.98 MIL/uL (ref 3.87–5.11)
WBC: 5.7 10*3/uL (ref 4.0–10.5)

## 2011-02-21 ENCOUNTER — Encounter: Payer: Self-pay | Admitting: Family Medicine

## 2011-02-22 ENCOUNTER — Encounter: Payer: Self-pay | Admitting: Family Medicine

## 2011-02-22 ENCOUNTER — Encounter: Payer: Self-pay | Admitting: *Deleted

## 2011-02-22 ENCOUNTER — Ambulatory Visit (INDEPENDENT_AMBULATORY_CARE_PROVIDER_SITE_OTHER): Payer: Medicaid Other | Admitting: Family Medicine

## 2011-02-22 VITALS — BP 134/84 | HR 99 | Resp 16 | Ht 66.0 in | Wt 187.1 lb

## 2011-02-22 DIAGNOSIS — I10 Essential (primary) hypertension: Secondary | ICD-10-CM

## 2011-02-22 DIAGNOSIS — N760 Acute vaginitis: Secondary | ICD-10-CM

## 2011-02-22 DIAGNOSIS — Z111 Encounter for screening for respiratory tuberculosis: Secondary | ICD-10-CM

## 2011-02-22 DIAGNOSIS — R7301 Impaired fasting glucose: Secondary | ICD-10-CM

## 2011-02-22 MED ORDER — CETIRIZINE HCL 10 MG PO TABS: 10.0000 mg | ORAL_TABLET | Freq: Every day | ORAL | Status: DC

## 2011-02-22 MED ORDER — FLUCONAZOLE 150 MG PO TABS: 150.0000 mg | ORAL_TABLET | Freq: Once | ORAL | Status: AC

## 2011-02-22 NOTE — Patient Instructions (Addendum)
F/u in3 months.  It is important that you exercise regularly at least 30 minutes 5 times a week. If you develop chest pain, have severe difficulty breathing, or feel very tired, stop exercising immediately and seek medical attention    A healthy diet is rich in fruit, vegetables and whole grains. Poultry fish, nuts and beans are a healthy choice for protein rather then red meat. A low sodium diet and drinking 64 ounces of water daily is generally recommended. Oils and sweet should be limited. Carbohydrates especially for those who are diabetic or overweight, should be limited to 34-45 gram per meal. It is important to eat on a regular schedule, at least 3 times daily. Snacks should be primarily fruits, vegetables or nuts. hBA1c  End June

## 2011-02-23 ENCOUNTER — Encounter: Payer: Self-pay | Admitting: Family Medicine

## 2011-02-23 NOTE — Progress Notes (Signed)
  Subjective:    Patient ID: Jane English, female    DOB: [birth year], 40 y.o.   MRN: [id number]  HPI The PT is here for follow up and re-evaluation of chronic medical conditions, medication management and review of recent lab and radiology data.  Preventive health is updated, specifically  Cancer screening, and Immunization.   Jane English is depressed about her inability to support herself and live independently, and she worries a lot about her children also who are unemployed, not in school and have behavioral problems as well. She is hoping to get back into the work force and is requesting a letter stating she is able to work. She ambulates without assistance. She is anxious and depressed due to social circumstances, ut is not homicidal, suicidal or hallucinating. Mentally she is stable and has been off antidepressants or antipsychotics for nonths    Review of Systems Denies recent fever or chills. Denies sinus pressure, nasal congestion, ear pain or sore throat. Denies chest congestion, productive cough or wheezing. Denies chest pains, palpitations, paroxysmal nocturnal dyspnea, orthopnea and leg swelling Denies abdominal pain, nausea, vomiting,diarrhea or constipation.  Denies rectal bleeding or change in bowel movement. Denies dysuria, frequency, hesitancy or incontinence.c/o vaginal itch. Chronic back pain Denies depression, anxiety or insomnia. Denies skin break down or rash.        Objective:   Physical Exam Patient alert and oriented and in no Cardiopulmonary distress.  HEENT: No facial asymmetry, EOMI, no sinus tenderness,  Oropharynx pink and moist.  Neck supple no adenopathy.  Chest: Clear to auscultation bilaterally.  CVS: S1, S2 no murmurs, no S3.  ABD: Soft non tender. Bowel sounds normal.  Ext: No edema  MS: Adequate ROM spine, shoulders, hips and knees.  Skin: Intact, no ulcerations or rash noted.  Psych: Poor  eye contact,flatl affect. Memory intact not  anxious but depressed appearing.  CNS: CN 2-12 intact, power, tone and sensation normal throughout.        Assessment & Plan:  1.Hypertension:Controlled, no changes in medication.  2.Depression: primarily due to social circumstances, unchanged. 3. Asthma: stable 4. Prediabetes: pt encouraged to limit carb intake, exercise and lose weight to reduce the risk of developing diabetes

## 2011-02-25 ENCOUNTER — Telehealth: Payer: Self-pay | Admitting: Family Medicine

## 2011-02-25 NOTE — Telephone Encounter (Signed)
Incorrect call back #

## 2011-02-26 ENCOUNTER — Telehealth: Payer: Self-pay | Admitting: Family Medicine

## 2011-03-19 NOTE — Op Note (Signed)
NAMECIEARA, STIERWALT                 ACCOUNT NO.:  [id number]   MEDICAL RECORD NO.:  [id number]          PATIENT TYPE:  OIB   LOCATION:  3534                         FACILITY:  [facility]   PHYSICIAN:  Hilda Lias, M.D.   DATE OF BIRTH:  1971/02/19   DATE OF PROCEDURE:  05/16/2009  DATE OF DISCHARGE:                               OPERATIVE REPORT   PREOPERATIVE DIAGNOSIS:  Right L5-S1 herniated disk.  Chronic plus acute  component.   POSTOPERATIVE DIAGNOSIS:  Right L5-S1 herniated disk.  Chronic plus  acute component.   PROCEDURE:  Right L5-S1 diskectomy, decompression of the thecal sac as  well as the S1 nerve root.  Lysis of adhesion, foraminotomy.  Microscope.   SURGEON:  Hilda Lias, MD   ASSISTANT:  Stefani Dama, MD   CLINICAL HISTORY:  Ms. Jane English is a lady who had been complaining of back  pain worsened to the right leg for quite a long period of time.  The  patient lately is getting worse.  She had an MRI which showed herniated  disk at L5-S1 to the right side.  She also has quite a bit of  degenerative disk disease.  Surgery was advised.  The risks were  explained to her including possibility of need for surgery.   PROCEDURE:  The patient was taken to the OR and she was positioned in a  prone manner.  The patient had quite a bit of lordosis of the lumbar  spine.  X-ray with a marker showed that we were at the level L5-S1.  Then a midline incision from L5-S1 was made and muscles were retracted  laterally.  The canal was quite vertical.  We had to bring the  microscope immediately to be able to get into the canal.  We had to use  the 45 and 90 degrees Kerrison punch.  Then with the drill we removed  part of the lamina of L5-S1.  Calcified ligament was also excised.  Immediately, we found that the S1 root was swollen and displaced.  It  was quite sensitive to touch.  Lysis was accomplished.  We were able to  lift the nerve root and indeed there was a large fragment of  soft  herniated disk.  Having done this we found that indeed not only she had  that, but she had quite a bit of chronic herniated disk with  calcification and adhesion.  Lysis was accomplished.  We entered the  disk space and we were able to do a total gross diskectomy.  The major  part of the time was removing the scar tissue which was in the takeoff  of the S1 as well as the anterior part of the thecal sac.  When this was  accomplished, the area was fully decompressed.  Valsalva maneuver was  negative.  Fentanyl and Depo-Medrol were left in the epidural space and  the wound was closed with Vicryl and Steri-Strips.           ______________________________  Hilda Lias, M.D.     EB/MEDQ  D:  05/16/2009  T:  05/17/2009  Job:  [id number]

## 2011-03-21 ENCOUNTER — Telehealth: Payer: Self-pay | Admitting: Family Medicine

## 2011-03-21 NOTE — Telephone Encounter (Signed)
Noted and agree. 

## 2011-03-22 NOTE — Op Note (Signed)
[place]  Patient:    Jane English, Jane English Visit Number: [id number] MRN: [id number]          Service Type: Attending:  Artist Pais. Mina Marble, M.D. Dictated by:   Artist Pais Mina Marble, M.D. Proc. Date: 11/11/01                             Operative Report  PREOPERATIVE DIAGNOSIS:  Right fifth proximal phalangeal intra-articular fracture.  POSTOPERATIVE DIAGNOSIS:  Right fifth proximal phalangeal intra-articular fracture.  PROCEDURE:  Closed reduction and percutaneous pinning of above fracture using 0.028 K-wires x5.  SURGEON:  Artist Pais. Mina Marble, M.D.  ASSISTANT:  Junius Roads. Ireton, P.A.C.  ANESTHESIA:  General.  TOURNIQUET TIME:  Twenty-eight minutes.  COMPLICATIONS:  No complications.  DRAINS:  No drains.  DESCRIPTION OF PROCEDURE:  Patient was taken to the operating room and after the induction of adequate general anesthesia, right upper extremity was prepped and draped in the usual sterile fashion.  An Esmarch was used to exsanguinate the limb.  Tourniquet was inflated to 250 mmHg.  At this point in time, a closed reduction was performed using a combination of traction and manual pressure across the fracture site.  After several attempts, a decent reduction was obtained on both the AP and lateral plane of a complex intra-articular fracture.  Initially, the condyles were pinned using an 0.028 K-wire from lateral to medial, or from ulnar to radial.  After this was done, two more 0.028 K-wires were driven, again from lateral to medial, or from ulnar to radial, across the fracture site to fasten the condylar fragments onto the shaft.  This was then followed by two other 0.028 K-wires that went from distal-ulnar to proximal-radial across the condyle onto the shaft for further fixation.  This reduced the fracture in adequate manner in both the AP lateral and oblique views.  These K-wires were all cut outside the skin and bent upon themselves.   Patient was then placed in a sterile dressing of Xeroform, 4 x 4s and a ulnar gutter splint.  The patient tolerated the procedure well and went to recovery room in stable fashion. Dictated by:   Artist Pais Mina Marble, M.D. Attending:  Artist Pais Mina Marble, M.D. DD:  11/11/01 TD:  11/11/01 Job: [id number]

## 2011-03-22 NOTE — Discharge Summary (Signed)
NAME:  KYRIA, BUMGARDNER                           ACCOUNT NO.:  [id number]   MEDICAL RECORD NO.:  [id number]                   PATIENT TYPE:  INP   LOCATION:  A303                                 FACILITY:  [facility]   PHYSICIAN:  Dirk Dress. Katrinka Blazing, M.D.                DATE OF BIRTH:  July 04, 1971   DATE OF ADMISSION:  04/01/2002  DATE OF DISCHARGE:  04/07/2002                                 DISCHARGE SUMMARY   DISCHARGE DIAGNOSES:  1. Small-bowel obstruction due to adhesions with internal hernia.  2. Mild anemia.   PROCEDURE:  Exploratory laparotomy with adhesiolysis on Apr 02, 2002.   DISPOSITION:  The patient is discharged home in stable and satisfactory  condition.   DISCHARGE MEDICATIONS:  1. Tylox one to two every four hours as needed for pain.  2. Reglan 10 mg a.c. and h.s.  3. Phenergan 25 mg q.4h. p.r.n.  4. Mylanta 30 cc two tablespoons q.4h. p.r.n.   FOLLOW UP:  The patient was scheduled to be seen in the office on April 14, 2002.   HISTORY OF PRESENT ILLNESS:  A 40 year old female with history of acute  onset of pain across the abdomen about 8 a.m.  She described the pain as  severe, cramping and worsening contractions.  She had nausea with vomiting  after she drank contrast in the emergency room.  She was in severe distress  and her pain was uncontrolled.  She was admitted for evaluation.   PAST SURGICAL HISTORY:  This includes appendectomy and right salpingo-  oophorectomy.   PAST MEDICAL HISTORY:  Migraine headaches and anemia.   HOSPITAL COURSE:  The patient was admitted and monitored.  She seemed to be  worse.  She refused nasogastric tube.  On the morning after admission, she  was taken to the operating room and explored.  She was found to have acute  adhesions between the small-bowel and omentum at the umbilicus with internal  herniation and a 270 degree rotation of bowel with proximal massive bowel  dilatation and distal decompressed bowel.  No appendix was  present.  The  patient underwent the adhesiolysis with reduction of her internal hernia.  She was monitored in the postoperative period and had no problems.  She had  rapid return of intestinal function and was started on liquids on the second  postoperative day.  She had no problems from that point on.  She was  subsequently discharged on April 07, 2002, which was the fifth hospital day,  in satisfactory condition.                                              Dirk Dress. Katrinka Blazing, M.D.   LCS/MEDQ  D:  09/04/2002  T:  09/06/2002  Job:  [id number]

## 2011-03-22 NOTE — H&P (Signed)
[facility]  Patient:    Jane English, Jane English Visit Number: [id number] MRN: [id number]          Service Type: MED Location: 3A X324 01 Attending Physician:  Herbert Seta Dictated by:   Jane English, M.D. Admit Date:  04/01/2002                           History and Physical  HISTORY OF PRESENT ILLNESS:  A 40 year old female with history of acute onset of abdominal pain about 8 a.m. today.  The pain was in her mid-abdomen and spread across the midportion of the abdomen but also was in the hypogastrium. She described the pain as severe, cramping, and much worse than contractions from delivery.  The pain increased with all types of movement.  She did not have nausea and vomiting until she drank the contrast in the emergency room for the CT scan.  Prior to this, her bowel movements have been normal.  The patient is in acute severe distress and her pain is uncontrolled by Toradol and Morphine sulfate in the emergency room.  She is admitted for further evaluation.  She possibly will need exploratory laparotomy.  PAST MEDICAL HISTORY:  She states that she had an appendectomy many years ago but she attributes the appendectomy to me.  There is no evidence that I have did an appendectomy on her.  I did do a right salpingo-oophorectomy on her in the past.  Other medical history includes migraine headaches, anemia, and possible heart murmur.  She is followed medically by Dr. Syliva Overman.  PAST SURGICAL HISTORY:  Other surgery includes tubal ligation in 1999. Diagnostic laparoscopy with lysis of adhesions and ovarian cystectomy in 2000. She states she has had a hysterectomy but she has had regular menses so this is not possible.  She seems to be a very poor historian.  MEDICATIONS:  She is on no chronic medications at this time.  PHYSICAL EXAMINATION:  VITAL SIGNS:  Blood pressure 130/70, pulse 80, respirations 20, temperature 98.4.  HEENT:  Unremarkable  except for piercing in her tongue and in her lower lip. There does not seem to be any infections around these piercing.  NECK:  Supple.  CHEST:  Clear to auscultation.  HEART:  Regular rate and rhythm without murmurs, rubs, or gallops.  ABDOMEN:  Distended.  She has hypoactive bowel sounds.  She has moderate tenderness in the right lower quadrant with some guarding and rebound.  She also has some suprapubic tenderness.  PELVIC:  I did not repeat her pelvic exam but Dr. Shayne Alken suggests there is tenderness in the left adnexa with no tenderness on manipulation of the cervix.  Her uterus was felt to be normal by him but his exam was limited because of her severe abdominal tenderness.  RECTAL:  Normal.  No masses were noted.  EXTREMITIES:  Mild varicosities.  Otherwise unremarkable.  NEUROLOGICAL:  No focal motor, sensory, or cerebellar deficits.  IMPRESSION:  Acute abdominal pain, etiology undetermined.  History suggests early appendicitis, possibly adhesive disease with partial obstruction.  PLAN:  The patient will be treated symptomatically.  We will monitor her very closely.  We will do a repeat abdominal films in the morning.  If symptoms do not improve, she will probably have laparotomy or laparoscopy based on her clinical findings.  She has had significant adhesive disease in the past and open laparotomy may be the best treatment.  We will make  this decision preoperatively if necessary.Dictated by:   Jane English, M.D. Attending Physician:  Herbert Seta DD:  04/01/02 TD:  04/01/02 Job: [id number]

## 2011-03-22 NOTE — H&P (Signed)
[facility]  Patient:    Jane English, Jane English Visit Number: [id number] MRN: [id number]          Service Type: MED Location: 3A A323 01 Attending Physician:  Cassell Smiles. Dictated by:   Mirna Mires, M.D. Admit Date:  04/22/2002                           History and Physical  DATE OF BIRTH:  05-04-1971  HISTORY OF PRESENT ILLNESS:  The patient is a 40 year old single gravida 5, para 4, AB 1, employed black female from [place], [place].  The patient came to the emergency room for evaluation of severe stomach pains, starting around 10 p.m. on April 22, 2002, while in the tub.  The patient had been experiencing stomach pain off and on since 1800 on April 21, 2002.  Pain on April 21, 2002, was more located in the hypogastric area.  However, pain on April 22, 2002, was more around her belly button.  She described the pain as a severe pulling stinging sharp pain.  She required assistance getting out of tub.  She denies nausea, vomiting, dysuria, fever, and chills.  She did admit to feeling bloated.  PAST MEDICAL HISTORY:  Significant for surgery on Apr 02, 2002, for possible small bowel obstruction due to adhesions, treated with exploratory laparotomy and adhesiolysis.  Medical history is also positive for panic attacks. Medical history is negative for hypertension, diabetes, tuberculosis, cancer, sickle cell, asthma, and seizure disorder.  MEDICATIONS:  Prescribed medications are oxycodone one to two tablets p.o. every four hours p.r.n. for pain, Reglan 10 mg p.o. before meals and bed time, Phenergan 25 mg p.o. q.6h. p.r.n. for nausea, prednisone 10 mg (four tablets per day) for hives, Benadryl 50 mg p.o. every day, Milk of Magnesia as needed daily for constipation, and Aciphex 20 mg p.o. every day.  ALLERGIES:  No known drug allergies.  HABITS:  Negative for tobacco, ethanol, and street drugs.  Last menstrual period was April 13, 2002 x5 days.   Past medical history is also positive hospitalizations by Dr. Lisette Grinder in May of 2000 for laparoscopy with lysis of adhesions with small bowel and omentum adhered to the anterior abdominal wall at [facility], right salpingo-oophorectomy by laparotomy; a D&C (dilatation and curettage x2).  FAMILY HISTORY:  Revealed mother deceased at age 62 secondary to complications of cirrhosis of the liver.  Father deceased at age 60 secondary to cancer of lung.  Three sisters living, age 27 with history of diabetes, age 15 with a history of a diabetes, and age 77 with a history of hypertension.  One sister is deceased at seven months secondary to complications with pneumonia.  Nine brothers living and ranging in ages from 100 to 8 (oldest brother has a history of diabetes).  Brother decreased, cause unknown.  One daughter living age 69 in good health.  Three sons living, age 40 with history of migraine headaches, age 57 in good health, and age 36 with a history of asthma.  REVIEW OF SYSTEMS:  Positive for episodes of palpitations.  Review of systems is negative for chronic headache, epistaxis, bleeding gum, chronic cough, hemoptysis, lumps in breasts, discharge from breasts, hematemesis, dysuria, gross hematuria, melena, diarrhea, edema of legs, night sweats, weight loss, unexplained fever, diplopia, etc.  Review of systems is positive for episodic joint pain and constipation.  PHYSICAL EXAMINATION:  VITAL SIGNS:  On the floor,  temperature 98.1, pulse 77, respirations 20, and blood pressure 147/80.  GENERAL APPEARANCE:  Revealed a middle age, medium frame and medium height alert black female in no apparent respiratory distress.  SKIN:  Warm and dry.  HEENT:  Head normocephalic.  Ears - normal external canals.  Positive cerumen bilaterally.  Eyes - lids negative for ptosis.  Sclerae white.  Pupils round, equal, and reactive to light.  Extraocular movements intact.  Nose negative for  discharge.  Mouth - dentition good, positive filling.  Tongue - positive for a ring.  Posterior pharynx benign.  NECK:  Negative for lymphadenopathy and thyromegaly.  Supraclavicular space with no palpable nodes.  LUNGS:  Clear.  HEART:  Audible S1 and S2 without murmur, rub, or gallop.  Regular rate and rhythm.  BREASTS:  No skin change.  No nodules on palpation.  Right nipple inverted. Left nipple irregular without discharge.  Axilla - no palpable nodes.  ABDOMEN:  Slightly obese, palpable healed mid hypogastric surgical scar, soft, hypoactive bowel sounds.  Positive for tenderness on palpation along old healed hypogastric surgical scar.  No palpable mass and no organomegaly.  PELVIC:  Deferred.  RECTAL:  Deferred.  EXTREMITIES:  No edema.  No joint swelling or joint redness.  No joint hotness.  NEUROLOGIC:  Alert and oriented to person, place, and time.  Cranial nerves II-XII appeared intact.  LABORATORY DATA:  Serum amylase is 150.  Urinalysis (specific gravity 1.030, pH 5.0, no protein, and no glucose, no bilirubin, and no ketones, nitrite negative).  Sodium 139, potassium 3.1, chloride 104, CO2 30, glucose 112, BUN 11, creatinine 0.8, calcium 8.7.  Total protein 6.4, albumin 3.3, SGOT 18, SGPT 42, alkaline phosphatase 64, total bilirubin 0.3.  Serum lipase 72. White count 13.8, hemoglobin 12.3, hematocrit 35.8, platelets 359,000. Neutrophils 66%.  X-ray of abdomen was read as finding consistent with possible early ileus.  IMPRESSION:  Postoperative abdominal pain, probably secondary to early ileus.  PLAN:  IV fluids, n.p.o., Reglan IV, analgesia for pain, Pepcid 20 mg IV, repeat serum amylase and lipase in 12 hours, Phenergan as needed for nausea, repeat x-ray of abdomen in 24 hours. Dictated by:   Mirna Mires, M.D. Attending Physician:  Cassell Smiles DD:  04/23/02 TD:  04/23/02 Job: [id number]

## 2011-03-22 NOTE — Op Note (Signed)
[facility]  Patient:    Jane English, Jane English Visit Number: [id number] MRN: [id number]          Service Type: MED Location: 3A A213 01 Attending Physician:  Herbert Seta Dictated by:   Elpidio Anis, M.D. Proc. Date: 04/02/02 Admit Date:  04/01/2002                             Operative Report  PREOPERATIVE DIAGNOSIS:  Partial small bowel obstruction.  POSTOPERATIVE DIAGNOSIS:  Partial small bowel obstruction due to adhesions with internal herniation.  PROCEDURE:  Exploratory laparotomy with adhesiolysis.  SURGEON:  Elpidio Anis, M.D.  DESCRIPTION OF PROCEDURE:  Under general anesthesia, the patients abdomen was prepped and draped in a sterile field. A lower midline incision was made. There were dense adhesions of the omentum to the anterior peritoneum. Taking great care, these adhesions were sharply and bluntly taken down. At this level, immediately below the umbilicus, there was dilated bowel and totally decompressed bowel. The incision was extended inferiorly. Once better exposure was obtained, it was apparent that she had dense adhesions of a segment of the mid portion of the small bowel which probably was at the proximal ileum level. This bowel was densely adhesed to the omentum and the anterior abdominal wall. There were some adhesions between two loops of bowel which created a bridge across the loop of bowel that was adhesed to the anterior abdominal wall. This had created an opening for internal herniation of the bowel proximal to the point of attachment. This internal herniation resulted in greater than 270 degree rotation of the bowel and a portion of its mesentery. The vascular supply of the bowel was not compromised. The adhesions between the two loops of small bowel were taken down sharply. The adhesions between the small bowel, omentum and anterior abdominal wall were taken down sharply and bluntly. Once this was done, the bowel could be  reversed in this rotation and the obstruction was relieved. The bowel was then inspected down to the ileocecal junction. There was no evidence of obstruction distally. The tip of the cecum was inspected and there were sutures in this area with no appendix compatible with a prior history of appendectomy. The proximal bowel was inspected and the bowel was dilated all the way up as high as we could check up to the proximal jejunum. The stomach was palpated.  The nasogastric tube was positioned in the stomach. No other abnormality was noted. The small bowel was placed back in the abdomen and the peritoneum and fascia was closed with running #1 Prolene. The subcutaneous tissue was closed with 2-0 Biosyn. The skin was closed with staples. The patient tolerated the procedure well. A dressing was placed. She was awakened from anesthesia uneventfully, transferred to a bed and taken to the post anesthesia care unit. Dictated by:   Elpidio Anis, M.D. Attending Physician:  Herbert Seta DD:  04/02/02 TD:  04/03/02 Job: [id number]

## 2011-03-22 NOTE — Op Note (Signed)
   NAME:  Jane English, Jane English                           ACCOUNT NO.:  [id number]   MEDICAL RECORD NO.:  [id number]                   PATIENT TYPE:  OUT   LOCATION:  MDC                                  FACILITY:  [facility]   PHYSICIAN:  Casimiro Needle L. Thad Ranger, M.D.           DATE OF BIRTH:  October 17, 1971   DATE OF PROCEDURE:  08/23/2003  DATE OF DISCHARGE:                                 OPERATIVE REPORT   PROCEDURE:  Diagnostic lumbar puncture.   OPERATOR:  Michael L. Thad Ranger, M.D.   INDICATIONS FOR PROCEDURE:  Suspected multiple sclerosis.   DESCRIPTION OF PROCEDURE:  Informed consent was obtained after the procedure  risks and benefits were explained to the patient. She agreed to proceed. The  patient was placed in the right lateral decubitus position and prepped and  draped in the usual sterile fashion. Local anesthesia was achieved with 2 mL  of Lidocaine.   A 20 gauge spinal needle  was inserted into the L3-4 interspace and advanced  until clear CSF was obtained. Opening pressure was measured at 280 mm of  water. Approximately 15 mL of clear CSF was obtained and sent for laboratory  analysis as follows:   1. Tube #1, 3 mL, cell count and differential.  2. Tube #2, 3 mL, glucose, protein, immunoelectrophoresis and oligoclonal     banding analysis.  3. Tube #3, 2 mL, Gram stain and culture.  4. Tube #4, 8 mL to be held for additional tests as needed.   Closing pressure was measured at 160 mm of water. The needle  was withdrawn.  Hemostasis was achieved. No immediate complications were noted following the  procedure. The patient will have 1 tube of blood to be sent to the  laboratory with the CSF for oligoclonal banding analysis.   The patient was  instructed to remain supine for 1 hour and felt to be able  to be discharged to home. She was advised to remain off her feet for the  remainder of the day and to let us know if she developed postural headache  over the next  few days. She was  also advised to present to the emergency  room immediately if she developed back pain, lower extremity weakness, bowel  or bladder incontinence, severe lower back pain, fever or altered mental  status.                                               Michael L. Thad Ranger, M.D.    MLR/MEDQ  D:  08/23/2003  T:  08/23/2003  Job:  [id number]

## 2011-03-22 NOTE — Procedures (Signed)
[name], Jane English                 ACCOUNT NO.:  [id number]   MEDICAL RECORD NO.:  [id number]          PATIENT TYPE:  OUT   LOCATION:  RESP                          FACILITY:  [facility]   PHYSICIAN:  Edward L. Juanetta Gosling, M.D.DATE OF BIRTH:  1971/04/28   DATE OF PROCEDURE:  DATE OF DISCHARGE:                            PULMONARY FUNCTION TEST   1. Spirometry shows a mild ventilatory defect without evidence of      airflow obstruction except in the smaller airways.  2. Lung volumes show reduction in total lung capacity which is about      the same degree of magnitude as the ventilatory defect.  3. DLCO is mildly reduced.  4. Arterial blood gases are normal.  5. There is no significant bronchodilator effect.      Edward L. Juanetta Gosling, M.D.  Electronically Signed     ELH/MEDQ  D:  10/10/2006  T:  10/10/2006  Job:  [id number]   cc:   Lodema Hong, M.D.

## 2011-03-22 NOTE — Op Note (Signed)
NAMEZAYDAH, Jane English                 ACCOUNT NO.:  [id number]   MEDICAL RECORD NO.:  [id number]          PATIENT TYPE:  OIB   LOCATION:  6120                         FACILITY:  [facility]   PHYSICIAN:  Dionne Ano. Gramig III, M.D.DATE OF BIRTH:  1971-08-19   DATE OF PROCEDURE:  07/04/2005  DATE OF DISCHARGE:                                 OPERATIVE REPORT   PREOPERATIVE DIAGNOSIS:  Chronic malunion, right small finger.   POSTOPERATIVE DIAGNOSIS:  Chronic malunion, right small finger.   OPERATION PERFORMED:  1.  Corrective osteotomy, right small finger, with plate and screw fixation.  2.  Extensive extensor tendon tenolysis, right small finger.  3.  Stress radiography, right small finger.   SURGEON:  Dionne Ano. Amanda Pea, M.D.   ASSISTANT:  Karie Chimera, P. A.-C.   COMPLICATIONS:  None.   ANESTHESIA:  General.   TOURNIQUET TIME:  The tourniquet time was less than an hour.   DRAINS:  None.   INDICATIONS FOR SURGERY:  Ms. Jane English presents with the above-mentioned  diagnosis.  I have counseled her with regards to the risks and benefits of  surgery.  She has asked to proceed with the above-mentioned operative  intervention.  I discussed with her all pre- and postoperative routines,  etc.   DESCRIPTION OF OPERATION:  The patient __________ , was brought to the  operative suite and underwent smooth induction of general anesthesia.  Following this the patient was laid supine and appropriately padded, prepped  and draped in the usual sterile fashion about the right upper extremity.  Once the sterile field was secured I elevated the arm and insufflated the  tourniquet to 250 mmHg.  I performed a curvilinear incision about the  proximal phalanx of the small finger.  I then performed an extensive  extensor tendon tenolysis and then split the tendon dorsal midline.  I then  elevated the periosteal tissues from radial-to-ulnar leaving a large flap  for later coverage of the plate.   Following this we then performed  corrective osteotomy after localization with stress radiography.  This was  premarked with a pen.  This was cut with an oscillating saw.  Following this  I then performed provisional fixation with a Kirschner wire.  Following this  I then placed an eight-hole plate allowing a four-screw disk on four screws  proximally to be placed.  This was a rectangular plate, which achieved  excellent fixation.  The plate was inset nicely.   Following this I performed stress radiography to reveal excellent position  of the screws.  I was pleased with the plate and screw fixation.  I then  performed irrigation followed by closure of the periosteal tissue and  removal of the provisional fixation.   The periosteal tissue was closed with 4-0 chromic.  The tendon was then  closed with 4-0 FiberWire and the skin edges with 4-0 Prolene.  She was  placed in a volar finger splint with plaster slab addition and awakened from  anesthesia.   The patient was taken to the recovery room where she was noted to be in  stable condition.  She will be monitored in the recovery room; and, will be  spending the night for IV antibiotics, pain control and general postop  measures.  I have discussed with her these __________ , etc.   It has been a pleasure to participate in her care and we look forward to  participating in her postop recovery.  All questions have been encouraged  and answered.           ______________________________  Dionne Ano. Everlene Other, M.D.     Nash Mantis  D:  07/04/2005  T:  07/05/2005  Job:  [id number]

## 2011-03-23 ENCOUNTER — Emergency Department (HOSPITAL_COMMUNITY): Payer: Medicaid Other

## 2011-03-23 ENCOUNTER — Emergency Department (HOSPITAL_COMMUNITY)
Admission: EM | Admit: 2011-03-23 | Discharge: 2011-03-24 | Disposition: A | Discharge status: Home / Self Care | Payer: Medicaid Other | Attending: Emergency Medicine | Admitting: Emergency Medicine

## 2011-03-23 DIAGNOSIS — Z8659 Personal history of other mental and behavioral disorders: Secondary | ICD-10-CM | POA: Insufficient documentation

## 2011-03-23 DIAGNOSIS — I10 Essential (primary) hypertension: Secondary | ICD-10-CM | POA: Insufficient documentation

## 2011-03-23 DIAGNOSIS — R059 Cough, unspecified: Secondary | ICD-10-CM | POA: Insufficient documentation

## 2011-03-23 DIAGNOSIS — R05 Cough: Secondary | ICD-10-CM | POA: Insufficient documentation

## 2011-03-23 DIAGNOSIS — Z86718 Personal history of other venous thrombosis and embolism: Secondary | ICD-10-CM | POA: Insufficient documentation

## 2011-03-23 LAB — URINE MICROSCOPIC-ADD ON

## 2011-03-23 LAB — CBC
MCV: 88.8 fL (ref 78.0–100.0)
Platelets: 358 10*3/uL (ref 150–400)
RDW: 13.5 % (ref 11.5–15.5)
WBC: 13.1 10*3/uL — ABNORMAL HIGH (ref 4.0–10.5)

## 2011-03-23 LAB — URINALYSIS, ROUTINE W REFLEX MICROSCOPIC
Glucose, UA: NEGATIVE mg/dL
Leukocytes, UA: NEGATIVE
Specific Gravity, Urine: 1.025 (ref 1.005–1.030)

## 2011-03-23 LAB — DIFFERENTIAL
Basophils Absolute: 0.1 10*3/uL (ref 0.0–0.1)
Eosinophils Absolute: 0.5 10*3/uL (ref 0.0–0.7)
Eosinophils Relative: 4 % (ref 0–5)
Lymphs Abs: 3.5 10*3/uL (ref 0.7–4.0)
Monocytes Absolute: 1.3 10*3/uL — ABNORMAL HIGH (ref 0.1–1.0)

## 2011-03-23 LAB — BASIC METABOLIC PANEL
BUN: 8 mg/dL (ref 6–23)
Creatinine, Ser: 0.79 mg/dL (ref 0.4–1.2)
GFR calc non Af Amer: >60 mL/min (ref >60)
Potassium: 3.8 mEq/L (ref 3.5–5.1)

## 2011-05-16 ENCOUNTER — Encounter: Payer: Self-pay | Admitting: Family Medicine

## 2011-05-24 ENCOUNTER — Encounter: Payer: Self-pay | Admitting: Family Medicine

## 2011-05-24 ENCOUNTER — Ambulatory Visit: Payer: Medicaid Other | Admitting: Family Medicine

## 2011-06-05 ENCOUNTER — Encounter: Payer: Self-pay | Admitting: Family Medicine

## 2011-06-06 ENCOUNTER — Encounter: Payer: Self-pay | Admitting: Family Medicine

## 2011-06-07 ENCOUNTER — Encounter: Payer: Self-pay | Admitting: Family Medicine

## 2011-06-07 ENCOUNTER — Ambulatory Visit (INDEPENDENT_AMBULATORY_CARE_PROVIDER_SITE_OTHER): Payer: Medicaid Other | Admitting: Family Medicine

## 2011-06-07 ENCOUNTER — Other Ambulatory Visit: Payer: Self-pay | Admitting: Family Medicine

## 2011-06-07 VITALS — BP 130/80 | HR 89 | Resp 16 | Ht 66.0 in | Wt 190.0 lb

## 2011-06-07 DIAGNOSIS — E739 Lactose intolerance, unspecified: Secondary | ICD-10-CM

## 2011-06-07 DIAGNOSIS — N6452 Nipple discharge: Secondary | ICD-10-CM

## 2011-06-07 DIAGNOSIS — E049 Nontoxic goiter, unspecified: Secondary | ICD-10-CM

## 2011-06-07 DIAGNOSIS — R519 Headache, unspecified: Secondary | ICD-10-CM | POA: Insufficient documentation

## 2011-06-07 DIAGNOSIS — Z79899 Other long term (current) drug therapy: Secondary | ICD-10-CM

## 2011-06-07 DIAGNOSIS — J45909 Unspecified asthma, uncomplicated: Secondary | ICD-10-CM

## 2011-06-07 DIAGNOSIS — R5381 Other malaise: Secondary | ICD-10-CM

## 2011-06-07 DIAGNOSIS — R51 Headache: Secondary | ICD-10-CM

## 2011-06-07 DIAGNOSIS — I10 Essential (primary) hypertension: Secondary | ICD-10-CM

## 2011-06-07 MED ORDER — CETIRIZINE HCL 10 MG PO TABS: 10.0000 mg | ORAL_TABLET | Freq: Every day | ORAL | Status: DC

## 2011-06-07 MED ORDER — METHYLPREDNISOLONE ACETATE 80 MG/ML IJ SUSP
80.0000 mg | Freq: Once | INTRAMUSCULAR | Status: AC
  Administered 2011-06-07: 80 mg via INTRAMUSCULAR

## 2011-06-07 MED ORDER — BUTALBITAL-APAP-CAFFEINE 50-325-40 MG PO TABS: 1.0000 | ORAL_TABLET | Freq: Four times a day (QID) | ORAL | Status: DC | PRN

## 2011-06-07 MED ORDER — MONTELUKAST SODIUM 10 MG PO TABS: 10.0000 mg | ORAL_TABLET | Freq: Every day | ORAL | Status: DC

## 2011-06-07 MED ORDER — ALBUTEROL SULFATE HFA 108 (90 BASE) MCG/ACT IN AERS: 2.0000 | INHALATION_SPRAY | Freq: Four times a day (QID) | RESPIRATORY_TRACT | Status: DC | PRN

## 2011-06-07 MED ORDER — BUDESONIDE-FORMOTEROL FUMARATE 80-4.5 MCG/ACT IN AERO: 2.0000 | INHALATION_SPRAY | Freq: Two times a day (BID) | RESPIRATORY_TRACT | Status: DC

## 2011-06-07 MED ORDER — KETOROLAC TROMETHAMINE 30 MG/ML IJ SOLN
60.0000 mg | Freq: Once | INTRAMUSCULAR | Status: AC
  Administered 2011-06-07: 60 mg via INTRAMUSCULAR

## 2011-06-07 MED ORDER — PREDNISONE (PAK) 5 MG PO TABS: 5.0000 mg | ORAL_TABLET | ORAL | Status: DC

## 2011-06-07 NOTE — Progress Notes (Signed)
  Subjective:    Patient ID: Jane English, female    DOB: [birth year], 40 y.o.   MRN: [id number]  HPI 1 month h/o daily headache, excrutiating pain, using alleve and tylenol no relief , rated at an 8, sleep does not relieve. 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 Concerned about excessive weight gain and fatigue, has been told her thyroid gland looks large and she ois concerned about this    Review of Systems See HPI Denies recent fever or chills. Denies sinus pressure, nasal congestion, ear pain or sore throat. Denies chest congestion or  productive cough , c/o increased chest tightness and wheezing having to use her neb machine daily, has no maintainance inhaler and reports singulair helped also  Denies chest pains, palpitations and leg swelling Denies abdominal pain, nausea, vomiting,diarrhea or constipation.   Denies dysuria, frequency, hesitancy or incontinence. Denies joint pain, swelling and limitation in mobility.   Denies skin break down or rash.        Objective:   Physical Exam Patient alert and oriented and in no cardiopulmonary distress.  HEENT: No facial asymmetry, EOMI, no sinus tenderness,  oropharynx pink and moist.  Neck supple no adenopathy.Goiter. Fundoscopy: no hemorhage  Chest: Clear to auscultation bilaterally.  CVS: S1, S2 no murmurs, no S3.  ABD: Soft non tender. Bowel sounds normal.  Ext: No edema  MS: Adequate ROM spine, shoulders, hips and knees.  Skin: Intact, no ulcerations or rash noted.  Psych: Good eye contact, normal affect. Memory intact not anxious or depressed appearing.  CNS: CN 2-12 intact, power, tone and sensation normal throughout.        Assessment & Plan:

## 2011-06-07 NOTE — Patient Instructions (Addendum)
cPE in 6 weeks.  Fasting labs asap.  Mammogram past due , we will schedule  You are being referred for an [place] of your thyroid gland    You will get new med  For your  Headaches, call next week if no better I will refer you to neurology  No skin test needed

## 2011-06-07 NOTE — Assessment & Plan Note (Signed)
Uncontrolled, using neb machine twice weekly, and has no proventil, also requests singulair as this helps

## 2011-06-09 NOTE — Assessment & Plan Note (Signed)
impt of weight loss and low carb diet stressed, hBA1C to be checked

## 2011-06-09 NOTE — Assessment & Plan Note (Signed)
Need [place] of thyroid gland to eval goiter

## 2011-06-09 NOTE — Assessment & Plan Note (Signed)
Controlled, no change in medication  

## 2011-06-09 NOTE — Assessment & Plan Note (Signed)
Deteriorated, likely migraine, cluster type, injections in the office and med prescribed, if no improvement , neurology eval

## 2011-06-10 ENCOUNTER — Telehealth: Payer: Self-pay | Admitting: Family Medicine

## 2011-06-11 ENCOUNTER — Telehealth: Payer: Self-pay

## 2011-06-11 MED ORDER — CROMOLYN SODIUM 4 % OP SOLN: 1.0000 [drp] | Freq: Four times a day (QID) | OPHTHALMIC | Status: DC

## 2011-06-11 NOTE — Telephone Encounter (Signed)
Patient didn't have her eye drops at the OV but they are Cromolyn solution 4% 1 drop in each eye 4x daily as needed. Can she have a refill? [facility]

## 2011-06-11 NOTE — Telephone Encounter (Signed)
Med sent in for patient  

## 2011-06-11 NOTE — Telephone Encounter (Signed)
Refill x  2 pls 

## 2011-06-11 NOTE — Telephone Encounter (Signed)
Her 2 meds are waiting to be approved by insurance. She is aware

## 2011-06-12 ENCOUNTER — Telehealth: Payer: Self-pay | Admitting: Family Medicine

## 2011-06-12 ENCOUNTER — Other Ambulatory Visit: Payer: Self-pay

## 2011-06-12 MED ORDER — BECLOMETHASONE DIPROPIONATE 40 MCG/ACT IN AERS: 2.0000 | INHALATION_SPRAY | Freq: Two times a day (BID) | RESPIRATORY_TRACT | Status: DC

## 2011-06-12 NOTE — Telephone Encounter (Signed)
Patient aware.

## 2011-06-12 NOTE — Telephone Encounter (Signed)
pls let pt know insurance is requiring her to use another inhaler before covering the symbicort, this is qvar, and I am prescribing it , it is entered historically pls send after you spk with her

## 2011-06-13 ENCOUNTER — Ambulatory Visit (HOSPITAL_COMMUNITY)
Admission: RE | Admit: 2011-06-13 | Discharge: 2011-06-13 | Disposition: A | Discharge status: Home / Self Care | Payer: Medicaid Other | Source: Ambulatory Visit | Attending: Family Medicine | Admitting: Family Medicine

## 2011-06-13 DIAGNOSIS — E049 Nontoxic goiter, unspecified: Secondary | ICD-10-CM

## 2011-06-13 LAB — LIPID PANEL
HDL: 59 mg/dL (ref >39)
LDL Cholesterol: 109 mg/dL — ABNORMAL HIGH (ref 0–99)
Total CHOL/HDL Ratio: 3 Ratio

## 2011-06-14 ENCOUNTER — Other Ambulatory Visit: Payer: Self-pay

## 2011-06-14 DIAGNOSIS — R7301 Impaired fasting glucose: Secondary | ICD-10-CM

## 2011-06-14 LAB — COMPLETE METABOLIC PANEL WITH GFR
ALT: 18 U/L (ref 0–35)
Alkaline Phosphatase: 75 U/L (ref 39–117)
CO2: 23 mEq/L (ref 19–32)
Sodium: 140 mEq/L (ref 135–145)
Total Bilirubin: 0.2 mg/dL — ABNORMAL LOW (ref 0.3–1.2)
Total Protein: 7 g/dL (ref 6.0–8.3)

## 2011-06-14 LAB — HEMOGLOBIN A1C: Hgb A1c MFr Bld: 6.4 % — ABNORMAL HIGH (ref <5.7)

## 2011-06-14 MED ORDER — METFORMIN HCL 500 MG PO TABS: 500.0000 mg | ORAL_TABLET | Freq: Every day | ORAL | Status: DC

## 2011-06-26 ENCOUNTER — Telehealth: Payer: Self-pay | Admitting: Family Medicine

## 2011-06-26 ENCOUNTER — Ambulatory Visit (INDEPENDENT_AMBULATORY_CARE_PROVIDER_SITE_OTHER): Payer: Medicaid Other | Admitting: Family Medicine

## 2011-06-26 DIAGNOSIS — Z111 Encounter for screening for respiratory tuberculosis: Secondary | ICD-10-CM

## 2011-06-26 NOTE — Telephone Encounter (Signed)
Patient will come to have ppd test placed today

## 2011-06-28 LAB — TB SKIN TEST: Induration: 0

## 2011-06-30 ENCOUNTER — Other Ambulatory Visit: Payer: Self-pay | Admitting: Family Medicine

## 2011-07-03 ENCOUNTER — Encounter: Payer: Self-pay | Admitting: Family Medicine

## 2011-07-04 ENCOUNTER — Encounter: Payer: Self-pay | Admitting: Family Medicine

## 2011-07-04 ENCOUNTER — Other Ambulatory Visit (HOSPITAL_COMMUNITY)
Admission: RE | Admit: 2011-07-04 | Discharge: 2011-07-04 | Disposition: A | Discharge status: Home / Self Care | Payer: Medicaid Other | Source: Ambulatory Visit | Attending: Family Medicine | Admitting: Family Medicine

## 2011-07-04 ENCOUNTER — Ambulatory Visit (INDEPENDENT_AMBULATORY_CARE_PROVIDER_SITE_OTHER): Payer: Medicaid Other | Admitting: Family Medicine

## 2011-07-04 VITALS — BP 130/84 | HR 77 | Resp 16 | Ht 66.0 in | Wt 184.1 lb

## 2011-07-04 DIAGNOSIS — F3289 Other specified depressive episodes: Secondary | ICD-10-CM

## 2011-07-04 DIAGNOSIS — Z0000 Encounter for general adult medical examination without abnormal findings: Secondary | ICD-10-CM

## 2011-07-04 DIAGNOSIS — Z1211 Encounter for screening for malignant neoplasm of colon: Secondary | ICD-10-CM

## 2011-07-04 DIAGNOSIS — Z124 Encounter for screening for malignant neoplasm of cervix: Secondary | ICD-10-CM

## 2011-07-04 DIAGNOSIS — Z01419 Encounter for gynecological examination (general) (routine) without abnormal findings: Secondary | ICD-10-CM | POA: Insufficient documentation

## 2011-07-04 DIAGNOSIS — F329 Major depressive disorder, single episode, unspecified: Secondary | ICD-10-CM

## 2011-07-04 DIAGNOSIS — I10 Essential (primary) hypertension: Secondary | ICD-10-CM

## 2011-07-04 DIAGNOSIS — E739 Lactose intolerance, unspecified: Secondary | ICD-10-CM

## 2011-07-04 DIAGNOSIS — IMO0002 Reserved for concepts with insufficient information to code with codable children: Secondary | ICD-10-CM

## 2011-07-04 DIAGNOSIS — N760 Acute vaginitis: Secondary | ICD-10-CM

## 2011-07-04 LAB — POC HEMOCCULT BLD/STL (OFFICE/1-CARD/DIAGNOSTIC): Fecal Occult Blood, POC: NEGATIVE

## 2011-07-04 MED ORDER — CYCLOBENZAPRINE HCL 10 MG PO TABS: ORAL_TABLET | ORAL | Status: DC

## 2011-07-04 MED ORDER — FLUOXETINE HCL 10 MG PO CAPS: 10.0000 mg | ORAL_CAPSULE | Freq: Every day | ORAL | Status: DC

## 2011-07-04 MED ORDER — MELOXICAM 15 MG PO TABS: 15.0000 mg | ORAL_TABLET | Freq: Every day | ORAL | Status: DC

## 2011-07-04 NOTE — Assessment & Plan Note (Signed)
Controlled, no change in medication  

## 2011-07-04 NOTE — Assessment & Plan Note (Signed)
Deteriorated , experiencing mood swings, will try low dose med. Not suicidal or homicidal

## 2011-07-04 NOTE — Progress Notes (Signed)
  Subjective:    Patient ID: Jane English, female    DOB: November 19, 1970, 40 y.o.   MRN: [id number]  HPI The PT is here for annual exam and re-evaluation of chronic medical conditions, medication management and review of any available recent lab and radiology data.  Preventive health is updated, specifically  Cancer screening and Immunization.   Questions or concerns regarding consultations or procedures which the PT has had in the interim are  addressed. The PT denies any adverse reactions to current medications since the last visit.  C/o increased back pain x 2 weeks.Thinks this has to do with lifting on the job. C/o mood insatbility, has episodes when she feels very depressed and like doing nothing, not suicidal or homicidal or hallucinating       Review of Systems See HPI Denies recent fever or chills. Denies sinus pressure, nasal congestion, ear pain or sore throat. Denies chest congestion, productive cough or wheezing. Denies chest pains, palpitations and leg swelling Denies abdominal pain, nausea, vomiting,diarrhea or constipation.   Denies dysuria, frequency, hesitancy or incontinence. Denies headaches, seizures, numbness, or tingling.  Denies skin breakdown or rash       Objective:   Physical Exam Pleasant well nourished female, alert and oriented x 3, in no cardio-pulmonary distress. Afebrile. HEENT No facial trauma or asymetry. Sinuses non tender.  EOMI, PERTL, fundoscopic exam is normal, no hemorhage or exudate.  External ears normal, tympanic membranes clear. Oropharynx moist, no exudate, good dentition. Neck: supple, no adenopathy,JVD or thyromegaly.No bruits.  Chest: Clear to ascultation bilaterally.No crackles or wheezes. Non tender to palpation  Breast: No asymetry,no masses. No nipple discharge or inversion. No axillary or supraclavicular adenopathy  Cardiovascular system; Heart sounds normal,  S1 and  S2 ,no S3.  No murmur, or thrill. Apical beat not  displaced Peripheral pulses normal.  Abdomen: Soft, non tender, no organomegaly or masses. No bruits. Bowel sounds normal. No guarding, tenderness or rebound.  Rectal:  No mass. Guaiac negative stool.  GU: External genitalia normal. No lesions. Vaginal canal normal.yellow  discharge. Uterus normal size, no adnexal masses, no cervical motion or adnexal tenderness.  Musculoskeletal exam: Full ROM of spine, hips , shoulders and knees. No deformity ,swelling or crepitus noted. No muscle wasting or atrophy.   Neurologic: Cranial nerves 2 to 12 intact. Power, tone ,sensation and reflexes normal throughout. No disturbance in gait. No tremor.  Skin: Intact, no ulceration, erythema , scaling or rash noted. Pigmentation normal throughout  Psych; Normal mood and affect. Judgement and concentration normal        Assessment & Plan:

## 2011-07-04 NOTE — Assessment & Plan Note (Signed)
Deteriorated, toradol administered and depomedrol ad prednisone prescribed

## 2011-07-04 NOTE — Patient Instructions (Signed)
F/u in 2 months.  New medication as discussed.  HBA1C and chem 7 non fasting just before next visit  It is important that you exercise regularly at least 30 minutes 5 times a week. If you develop chest pain, have severe difficulty breathing, or feel very tired, stop exercising immediately and seek medical attention    A healthy diet is rich in fruit, vegetables and whole grains. Poultry fish, nuts and beans are a healthy choice for protein rather then red meat. A low sodium diet and drinking 64 ounces of water daily is generally recommended. Oils and sweet should be limited. Carbohydrates especially for those who are diabetic or overweight, should be limited to 30-45 gram per meal. It is important to eat on a regular schedule, at least 3 times daily. Snacks should be primarily fruits, vegetables or nuts.  Congrats on weight loss

## 2011-07-05 LAB — GC/CHLAMYDIA PROBE AMP, GENITAL: GC Probe Amp, Genital: NEGATIVE

## 2011-07-05 LAB — WET PREP BY MOLECULAR PROBE: Candida species: NEGATIVE

## 2011-07-08 NOTE — Progress Notes (Signed)
  Subjective:    Patient ID: Jane English, female    DOB: 1971-06-29, 40 y.o.   MRN: [id number]  HPI Pt in for PPD placement, same done with no adverse local reactions    Review of Systems     Objective:   Physical Exam        Assessment & Plan:

## 2011-07-10 ENCOUNTER — Ambulatory Visit (HOSPITAL_COMMUNITY): Payer: Medicaid Other

## 2011-07-15 ENCOUNTER — Telehealth: Payer: Self-pay | Admitting: Family Medicine

## 2011-07-16 MED ORDER — TRAMADOL HCL 50 MG PO TABS: 50.0000 mg | ORAL_TABLET | Freq: Three times a day (TID) | ORAL | Status: DC | PRN

## 2011-07-16 NOTE — Telephone Encounter (Signed)
I recommend tramadol 50mg  one twice daily , as needed.Pls erx #60 refill 1 and let her know

## 2011-07-16 NOTE — Telephone Encounter (Signed)
Patient aware.

## 2011-07-16 NOTE — Telephone Encounter (Signed)
Called patient left message. Sent in [facility] to [facility] [place]

## 2011-07-17 ENCOUNTER — Ambulatory Visit (HOSPITAL_COMMUNITY): Payer: Medicaid Other

## 2011-07-24 ENCOUNTER — Encounter: Payer: Medicaid Other | Admitting: Family Medicine

## 2011-07-31 ENCOUNTER — Ambulatory Visit (HOSPITAL_COMMUNITY): Payer: Medicaid Other

## 2011-08-23 ENCOUNTER — Telehealth: Payer: Self-pay | Admitting: Family Medicine

## 2011-08-23 NOTE — Telephone Encounter (Signed)
Script written, pls fax to her pharmacy, she will let you know

## 2011-08-23 NOTE — Telephone Encounter (Signed)
Spoke with patient send to [facility]

## 2011-08-23 NOTE — Telephone Encounter (Signed)
Left message

## 2011-08-30 ENCOUNTER — Other Ambulatory Visit: Payer: Self-pay | Admitting: Family Medicine

## 2011-08-30 ENCOUNTER — Telehealth: Payer: Self-pay

## 2011-08-30 MED ORDER — ACYCLOVIR 800 MG PO TABS: 800.0000 mg | ORAL_TABLET | Freq: Three times a day (TID) | ORAL | Status: DC

## 2011-08-30 NOTE — Telephone Encounter (Signed)
Has been getting cold sores all over her lips and nothing is helping it. They will come and go and she constantly has at least one. Wants to know if you can call in a cream or pill to take for it

## 2011-08-30 NOTE — Telephone Encounter (Signed)
I am willing to treat presumptively for type 1 herpes, and will enter acyclovir, pls send after you speak with her

## 2011-08-30 NOTE — Telephone Encounter (Signed)
Do you want to work her in. No appts

## 2011-08-30 NOTE — Telephone Encounter (Signed)
Called and left patient message med was sent

## 2011-08-30 NOTE — Telephone Encounter (Signed)
I dont not see any information about this in her history. She will have to be seen before any anti-viral medication is given. She can go to Urgent care, or use yellow balmex OTC

## 2011-09-03 ENCOUNTER — Encounter: Payer: Self-pay | Admitting: Family Medicine

## 2011-09-05 ENCOUNTER — Encounter: Payer: Self-pay | Admitting: Family Medicine

## 2011-09-05 ENCOUNTER — Ambulatory Visit (INDEPENDENT_AMBULATORY_CARE_PROVIDER_SITE_OTHER): Payer: Medicaid Other | Admitting: Family Medicine

## 2011-09-05 ENCOUNTER — Other Ambulatory Visit: Payer: Self-pay | Admitting: Family Medicine

## 2011-09-05 VITALS — BP 128/86 | HR 81 | Resp 16 | Ht 66.0 in | Wt 186.1 lb

## 2011-09-05 DIAGNOSIS — B359 Dermatophytosis, unspecified: Secondary | ICD-10-CM | POA: Insufficient documentation

## 2011-09-05 DIAGNOSIS — Z139 Encounter for screening, unspecified: Secondary | ICD-10-CM

## 2011-09-05 DIAGNOSIS — R7301 Impaired fasting glucose: Secondary | ICD-10-CM

## 2011-09-05 DIAGNOSIS — F329 Major depressive disorder, single episode, unspecified: Secondary | ICD-10-CM

## 2011-09-05 DIAGNOSIS — J45909 Unspecified asthma, uncomplicated: Secondary | ICD-10-CM

## 2011-09-05 DIAGNOSIS — Z13220 Encounter for screening for lipoid disorders: Secondary | ICD-10-CM

## 2011-09-05 DIAGNOSIS — J309 Allergic rhinitis, unspecified: Secondary | ICD-10-CM | POA: Insufficient documentation

## 2011-09-05 DIAGNOSIS — I10 Essential (primary) hypertension: Secondary | ICD-10-CM

## 2011-09-05 MED ORDER — FLUTICASONE PROPIONATE 50 MCG/ACT NA SUSP: 2.0000 | Freq: Every day | NASAL | Status: DC

## 2011-09-05 MED ORDER — CLOTRIMAZOLE-BETAMETHASONE 1-0.05 % EX CREA: TOPICAL_CREAM | Freq: Two times a day (BID) | CUTANEOUS | Status: DC

## 2011-09-05 MED ORDER — ACYCLOVIR 800 MG PO TABS: 800.0000 mg | ORAL_TABLET | Freq: Three times a day (TID) | ORAL | Status: AC

## 2011-09-05 NOTE — Patient Instructions (Addendum)
F/u in 3.5 months.  HBa1C today, yoou were prediabetic when last checked, you need to change your eating to delay or prevent diabetes. You need to follow a low fat diet your cholesterol was too high  \Hba1c in 3.5 months and fasting lipid and chem 7  Mammogram is past due , we will schedule/you need to schedule

## 2011-09-07 NOTE — Assessment & Plan Note (Signed)
Controlled, no change in medication  

## 2011-09-07 NOTE — Assessment & Plan Note (Signed)
Improved and stable 

## 2011-09-07 NOTE — Assessment & Plan Note (Signed)
Controlled on current meds continue same

## 2011-09-07 NOTE — Assessment & Plan Note (Signed)
Metformin started to improve blood sugar control

## 2011-09-07 NOTE — Assessment & Plan Note (Signed)
H/o exposure to fungal infection

## 2011-09-07 NOTE — Progress Notes (Signed)
  Subjective:    Patient ID: Jane English, female    DOB: 05/08/1971, 40 y.o.   MRN: [id number]  HPI The PT is here for follow up and re-evaluation of chronic medical conditions, medication management and review of any available recent lab and radiology data.  Preventive health is updated, specifically  Cancer screening and Immunization.   Questions or concerns regarding consultations or procedures which the PT has had in the interim are  addressed. The PT denies any adverse reactions to current medications since the last visit.  Reports recent exposure to ring worm and wants medication for this States she is experiencing tingling in hands and feet, however no interest in nerve conduction tests at this time       Review of Systems See HPI Denies recent fever or chills. Denies sinus pressure, nasal congestion, ear pain or sore throat. Denies chest congestion, productive cough or wheezing. Denies chest pains, palpitations and leg swelling Denies abdominal pain, nausea, vomiting,diarrhea or constipation.   Denies dysuria, frequency, hesitancy or incontinence. Denies joint pain, swelling and limitation in mobility. Denies headaches, seizures, numbness Denies uncontrolled  depression, anxiety or insomnia. Denies skin break down or rash.        Objective:   Physical Exam Patient alert and oriented and in no cardiopulmonary distress.  HEENT: No facial asymmetry, EOMI, no sinus tenderness,  oropharynx pink and moist.  Neck supple no adenopathy.  Chest: Clear to auscultation bilaterally.  CVS: S1, S2 no murmurs, no S3.  ABD: Soft non tender. Bowel sounds normal.  Ext: No edema  MS: Adequate ROM spine, shoulders, hips and knees.  Skin: Intact, no ulcerations or rash noted.  Psych: Good eye contact, normal affect. Memory intact not anxious or depressed appearing.  CNS: CN 2-12 intact, power, tone and sensation normal throughout.        Assessment & Plan:

## 2011-09-09 ENCOUNTER — Ambulatory Visit (HOSPITAL_COMMUNITY): Payer: Medicaid Other

## 2011-09-17 ENCOUNTER — Ambulatory Visit (HOSPITAL_COMMUNITY)
Admission: RE | Admit: 2011-09-17 | Discharge: 2011-09-17 | Disposition: A | Discharge status: Home / Self Care | Payer: Medicaid Other | Source: Ambulatory Visit | Attending: Family Medicine | Admitting: Family Medicine

## 2011-09-17 DIAGNOSIS — Z1231 Encounter for screening mammogram for malignant neoplasm of breast: Secondary | ICD-10-CM | POA: Insufficient documentation

## 2011-09-17 DIAGNOSIS — Z139 Encounter for screening, unspecified: Secondary | ICD-10-CM

## 2011-09-18 ENCOUNTER — Telehealth: Payer: Self-pay | Admitting: Family Medicine

## 2011-09-18 MED ORDER — GLUCOSE BLOOD VI STRP: ORAL_STRIP | Status: DC

## 2011-09-18 MED ORDER — ACCU-CHEK MULTICLIX LANCETS MISC: Status: DC

## 2011-09-18 NOTE — Telephone Encounter (Signed)
Sent in accu chek  

## 2011-09-20 NOTE — Telephone Encounter (Signed)
Does not like accuchek. Will come by Monday and get shown how to use it

## 2011-09-24 ENCOUNTER — Other Ambulatory Visit: Payer: Self-pay

## 2011-09-24 ENCOUNTER — Telehealth: Payer: Self-pay

## 2011-09-24 ENCOUNTER — Telehealth: Payer: Self-pay | Admitting: Family Medicine

## 2011-09-24 MED ORDER — BENZONATATE 100 MG PO CAPS: 100.0000 mg | ORAL_CAPSULE | Freq: Three times a day (TID) | ORAL | Status: DC | PRN

## 2011-09-24 NOTE — Telephone Encounter (Signed)
Patient called in, states she has nasal and chest congestion, yellowish white mucus. Has been using cough OTC cough meds and she's scared it will run her BP up. Has been using her neb and inhaler and wants to know if you will please since the holiday is coming up. [facility]

## 2011-09-24 NOTE — Telephone Encounter (Signed)
plsa dvise and send in tessalon perles 100mg  one 3 times daily #30

## 2011-09-24 NOTE — Telephone Encounter (Signed)
Left message that Merry Proud will send them to [facility] there are only 4.00

## 2011-09-24 NOTE — Telephone Encounter (Signed)
Faxed in

## 2011-10-02 ENCOUNTER — Telehealth: Payer: Self-pay

## 2011-10-02 ENCOUNTER — Other Ambulatory Visit: Payer: Self-pay

## 2011-10-02 ENCOUNTER — Other Ambulatory Visit: Payer: Self-pay | Admitting: Family Medicine

## 2011-10-02 MED ORDER — CHLORPHENIRAMINE-HYDROCODONE 8-10 MG/5ML PO LQCR: 5.0000 mL | Freq: Two times a day (BID) | ORAL | Status: DC | PRN

## 2011-10-02 MED ORDER — CETIRIZINE HCL 10 MG PO TABS: 10.0000 mg | ORAL_TABLET | Freq: Every day | ORAL | Status: DC

## 2011-10-02 MED ORDER — CHLORPHEN-PHENYLTOLOX-PE-APAP 8-50-40-325 MG PO TB12: 1.0000 | ORAL_TABLET | Freq: Two times a day (BID) | ORAL | Status: DC | PRN

## 2011-10-02 NOTE — Telephone Encounter (Signed)
Has a cold with cough and per Dr Lodema Hong, ok to refill the tussionex. Also she is wanting an rx of Norel CS sent in for her allergies. If ok, please give directions

## 2011-10-02 NOTE — Telephone Encounter (Signed)
Noted, thank you so much.

## 2011-10-02 NOTE — Telephone Encounter (Signed)
Printed the med requested per Dr Lodema Hong for her to sign

## 2011-10-02 NOTE — Telephone Encounter (Signed)
pls let her know that medication is no longer availble, by that name, I suggest tessalon pwerles 100mg  1 three times daily as needed, #42 and her allergy meds, I am entering the tessalon send in after speaking with her pls

## 2011-10-02 NOTE — Telephone Encounter (Signed)
The tesslon is already orderd.  She can ask the pharmacy to send over the med she is asking for if she thinks it exists

## 2011-10-18 ENCOUNTER — Other Ambulatory Visit: Payer: Self-pay | Admitting: Family Medicine

## 2011-10-31 ENCOUNTER — Other Ambulatory Visit: Payer: Self-pay | Admitting: Family Medicine

## 2011-11-15 ENCOUNTER — Telehealth: Payer: Self-pay | Admitting: Family Medicine

## 2011-11-18 MED ORDER — TRAMADOL HCL 50 MG PO TABS: 50.0000 mg | ORAL_TABLET | Freq: Three times a day (TID) | ORAL | Status: DC | PRN

## 2011-11-18 MED ORDER — FLUCONAZOLE 150 MG PO TABS: 150.0000 mg | ORAL_TABLET | Freq: Once | ORAL | Status: DC

## 2011-11-18 NOTE — Telephone Encounter (Signed)
erx fluconazole 150mg  #1 only please

## 2011-11-18 NOTE — Telephone Encounter (Signed)
Med sent and left message with patient

## 2011-11-18 NOTE — Telephone Encounter (Signed)
States she needs the tablet for yeast infection. Having itching and odor. States there was something you gave her in the past for that. Or does she need OV

## 2011-12-02 ENCOUNTER — Telehealth: Payer: Self-pay | Admitting: Family Medicine

## 2011-12-04 NOTE — Telephone Encounter (Signed)
States when she uses the bathroom it has a very strong smelly odor. No burning or frequency. Can she come in for Nurse visit [facility]?

## 2011-12-04 NOTE — Telephone Encounter (Signed)
Nurse visit [facility] on 12/05/2011 please

## 2011-12-05 NOTE — Telephone Encounter (Signed)
Pt states she can't come in today. But made nurse visit tomorrow at 9:00

## 2011-12-05 NOTE — Telephone Encounter (Signed)
Coming in today for NV

## 2011-12-06 ENCOUNTER — Ambulatory Visit (INDEPENDENT_AMBULATORY_CARE_PROVIDER_SITE_OTHER): Payer: Medicaid Other

## 2011-12-06 VITALS — BP 124/84 | Wt 180.0 lb

## 2011-12-06 DIAGNOSIS — N390 Urinary tract infection, site not specified: Secondary | ICD-10-CM

## 2011-12-06 LAB — POCT URINALYSIS DIPSTICK
Bilirubin, UA: NEGATIVE
Glucose, UA: NEGATIVE
Leukocytes, UA: NEGATIVE
Nitrite, UA: NEGATIVE
Urobilinogen, UA: 0.2
pH, UA: 5.5

## 2011-12-06 NOTE — Progress Notes (Signed)
Urine was negative for infection but contained small blood. Will send for culture

## 2011-12-09 ENCOUNTER — Telehealth: Payer: Self-pay

## 2011-12-09 ENCOUNTER — Other Ambulatory Visit: Payer: Self-pay | Admitting: Family Medicine

## 2011-12-09 LAB — URINE CULTURE

## 2011-12-09 MED ORDER — FLUCONAZOLE 150 MG PO TABS: ORAL_TABLET | ORAL | Status: AC

## 2011-12-09 MED ORDER — CHLORPHENIRAMINE-HYDROCODONE 8-10 MG/5ML PO LQCR: 5.0000 mL | Freq: Two times a day (BID) | ORAL | Status: DC | PRN

## 2011-12-09 MED ORDER — CIPROFLOXACIN HCL 500 MG PO TABS: 500.0000 mg | ORAL_TABLET | Freq: Two times a day (BID) | ORAL | Status: AC

## 2011-12-09 MED ORDER — BENZONATATE 100 MG PO CAPS: 100.0000 mg | ORAL_CAPSULE | Freq: Three times a day (TID) | ORAL | Status: DC | PRN

## 2011-12-09 NOTE — Telephone Encounter (Signed)
Refill x 1 and let her know pls 

## 2011-12-09 NOTE — Telephone Encounter (Signed)
Sent to [facility]

## 2011-12-09 NOTE — Telephone Encounter (Signed)
Pt not feeling well. (headache, sore throat, cough and chills, fatigue. No fever) Offered an appt but she was wanting to know if she could just have a refill of the tessalon perles and tussionex (advised no antibiotics without visit) and if she didn't feel better she would call for OV.  [facility]

## 2011-12-23 ENCOUNTER — Telehealth: Payer: Self-pay | Admitting: Family Medicine

## 2011-12-23 DIAGNOSIS — IMO0002 Reserved for concepts with insufficient information to code with codable children: Secondary | ICD-10-CM

## 2011-12-23 MED ORDER — CYCLOBENZAPRINE HCL 10 MG PO TABS: ORAL_TABLET | ORAL | Status: DC

## 2011-12-23 NOTE — Telephone Encounter (Signed)
Med sent in as requested  

## 2011-12-31 ENCOUNTER — Encounter: Payer: Self-pay | Admitting: Family Medicine

## 2011-12-31 ENCOUNTER — Ambulatory Visit (INDEPENDENT_AMBULATORY_CARE_PROVIDER_SITE_OTHER): Payer: Medicaid Other | Admitting: Family Medicine

## 2011-12-31 DIAGNOSIS — R5381 Other malaise: Secondary | ICD-10-CM

## 2011-12-31 DIAGNOSIS — E049 Nontoxic goiter, unspecified: Secondary | ICD-10-CM

## 2011-12-31 DIAGNOSIS — R7309 Other abnormal glucose: Secondary | ICD-10-CM

## 2011-12-31 DIAGNOSIS — J45909 Unspecified asthma, uncomplicated: Secondary | ICD-10-CM

## 2011-12-31 DIAGNOSIS — B359 Dermatophytosis, unspecified: Secondary | ICD-10-CM

## 2011-12-31 DIAGNOSIS — IMO0002 Reserved for concepts with insufficient information to code with codable children: Secondary | ICD-10-CM

## 2011-12-31 DIAGNOSIS — F411 Generalized anxiety disorder: Secondary | ICD-10-CM

## 2011-12-31 DIAGNOSIS — R7301 Impaired fasting glucose: Secondary | ICD-10-CM

## 2011-12-31 DIAGNOSIS — F329 Major depressive disorder, single episode, unspecified: Secondary | ICD-10-CM

## 2011-12-31 DIAGNOSIS — R7303 Prediabetes: Secondary | ICD-10-CM

## 2011-12-31 DIAGNOSIS — Z79899 Other long term (current) drug therapy: Secondary | ICD-10-CM

## 2011-12-31 DIAGNOSIS — I10 Essential (primary) hypertension: Secondary | ICD-10-CM

## 2011-12-31 DIAGNOSIS — M62838 Other muscle spasm: Secondary | ICD-10-CM

## 2011-12-31 LAB — COMPLETE METABOLIC PANEL WITH GFR
Alkaline Phosphatase: 80 U/L (ref 39–117)
GFR, Est Non African American: >89 mL/min
Glucose, Bld: 94 mg/dL (ref 70–99)
Sodium: 139 mEq/L (ref 135–145)
Total Bilirubin: 0.4 mg/dL (ref 0.3–1.2)
Total Protein: 7.6 g/dL (ref 6.0–8.3)

## 2011-12-31 LAB — LIPID PANEL
HDL: 69 mg/dL (ref >39)
LDL Cholesterol: 134 mg/dL — ABNORMAL HIGH (ref 0–99)
Total CHOL/HDL Ratio: 3.1 Ratio

## 2011-12-31 LAB — HEMOGLOBIN A1C: Mean Plasma Glucose: 128 mg/dL — ABNORMAL HIGH (ref <117)

## 2011-12-31 MED ORDER — CLOTRIMAZOLE-BETAMETHASONE 1-0.05 % EX CREA: TOPICAL_CREAM | Freq: Two times a day (BID) | CUTANEOUS | Status: DC

## 2011-12-31 MED ORDER — KETOROLAC TROMETHAMINE 60 MG/2ML IM SOLN
60.0000 mg | Freq: Once | INTRAMUSCULAR | Status: AC
  Administered 2011-12-31: 60 mg via INTRAMUSCULAR

## 2011-12-31 MED ORDER — MONTELUKAST SODIUM 10 MG PO TABS: 10.0000 mg | ORAL_TABLET | Freq: Every day | ORAL | Status: DC

## 2011-12-31 MED ORDER — CYCLOBENZAPRINE HCL 10 MG PO TABS: ORAL_TABLET | ORAL | Status: AC

## 2011-12-31 MED ORDER — IBUPROFEN 800 MG PO TABS: 800.0000 mg | ORAL_TABLET | Freq: Three times a day (TID) | ORAL | Status: DC | PRN

## 2011-12-31 MED ORDER — BECLOMETHASONE DIPROPIONATE 40 MCG/ACT IN AERS: 2.0000 | INHALATION_SPRAY | Freq: Two times a day (BID) | RESPIRATORY_TRACT | Status: DC

## 2011-12-31 MED ORDER — ALPRAZOLAM ER 0.5 MG PO TB24: 0.5000 mg | ORAL_TABLET | Freq: Three times a day (TID) | ORAL | Status: DC

## 2011-12-31 MED ORDER — BUTALBITAL-APAP-CAFFEINE 50-325-40 MG PO TABS: 1.0000 | ORAL_TABLET | Freq: Four times a day (QID) | ORAL | Status: DC | PRN

## 2011-12-31 MED ORDER — TRAMADOL HCL 50 MG PO TABS
50.0000 mg | ORAL_TABLET | Freq: Three times a day (TID) | ORAL | Status: DC | PRN
Start: 2011-12-31 — End: 2012-04-27

## 2011-12-31 MED ORDER — CROMOLYN SODIUM 4 % OP SOLN: 1.0000 [drp] | Freq: Four times a day (QID) | OPHTHALMIC | Status: DC

## 2011-12-31 NOTE — Assessment & Plan Note (Signed)
Controlled, no change in medication  

## 2011-12-31 NOTE — Patient Instructions (Addendum)
F/u in 4 month.  Injection today for neck pain,.Medication also sent in  Stop metformin, manage blood sugars with diet and exercise only please.Insurance will no longer pay for testing supplies  New inhaler qvar for daily use to improve asthma.  A healthy diet is rich in fruit, vegetables and whole grains. Poultry fish, nuts and beans are a healthy choice for protein rather then red meat. A low sodium diet and drinking 64 ounces of water daily is generally recommended. Oils and sweet should be limited. Carbohydrates especially for those who are diabetic or overweight, should be limited to 30-45 gram per meal. It is important to eat on a regular schedule, at least 3 times daily. Snacks should be primarily fruits, vegetables or nuts.  It is important that you exercise regularly at least 30 minutes 5 times a week. If you develop chest pain, have severe difficulty breathing, or feel very tired, stop exercising immediately and seek medical attention  HBA1C fasting l;ipid, cmp and Egfr today.  HBa1C in 4 month before next visit.  Happy about your new joy

## 2011-12-31 NOTE — Assessment & Plan Note (Signed)
Willd/c metformin for hBA1C under 6.5 , diet and exercise only

## 2011-12-31 NOTE — Assessment & Plan Note (Signed)
1 week h/o right neck pain and spasm, toradol, and med

## 2011-12-31 NOTE — Assessment & Plan Note (Signed)
Uncontrolled, using proventil daily, add qvar

## 2012-01-04 NOTE — Progress Notes (Signed)
  Subjective:    Patient ID: Jane English, female    DOB: Feb 14, 1971, 41 y.o.   MRN: [id number]  HPI The PT is here for follow up and re-evaluation of chronic medical conditions, medication management and review of any available recent lab and radiology data.  Preventive health is updated, specifically  Cancer screening and Immunization.   Questions or concerns regarding consultations or procedures which the PT has had in the interim are  addressed. The PT denies any adverse reactions to current medications since the last visit.  1 week h/o neck pain and spasm, no aggravating trauma noted, reduced mobility as a result. Denies polyuria, polydipsia or blurred vision     Review of Systems See HPI Denies recent fever or chills. Denies sinus pressure, nasal congestion, ear pain or sore throat. Denies chest congestion, productive cough or wheezing. Denies chest pains, palpitations and leg swelling Denies abdominal pain, nausea, vomiting,diarrhea or constipation.   Denies dysuria, frequency, hesitancy or incontinence.  Denies headaches, seizures, numbness, or tingling. Denies depression, anxiety or insomnia.Excited and happy about a new relationship she is in. Denies skin break down or rash.        Objective:   Physical Exam Patient alert and oriented and in no cardiopulmonary distress.  HEENT: No facial asymmetry, EOMI, no sinus tenderness,  oropharynx pink and moist.  Neck decreased ROM with right trapezius spasm, no adenopathy.  Chest: Clear to auscultation bilaterally.  CVS: S1, S2 no murmurs, no S3.  ABD: Soft non tender. Bowel sounds normal.  Ext: No edema  MS: Adequate ROM spine, shoulders, hips and knees.  Skin: Intact, no ulcerations or rash noted.  Psych: Good eye contact, normal affect. Memory intact not anxious or depressed appearing.  CNS: CN 2-12 intact, power, tone and sensation normal throughout.        Assessment & Plan:

## 2012-01-04 NOTE — Assessment & Plan Note (Signed)
Controlled, no change in medication  

## 2012-01-08 ENCOUNTER — Ambulatory Visit: Payer: Medicaid Other | Admitting: Family Medicine

## 2012-02-03 ENCOUNTER — Telehealth: Payer: Self-pay | Admitting: Family Medicine

## 2012-02-03 NOTE — Telephone Encounter (Signed)
Doesn't know what med she is talking about. Called pt left message

## 2012-02-04 ENCOUNTER — Other Ambulatory Visit: Payer: Self-pay | Admitting: Family Medicine

## 2012-02-04 MED ORDER — FLUCONAZOLE 150 MG PO TABS: ORAL_TABLET | ORAL | Status: AC

## 2012-02-04 NOTE — Telephone Encounter (Signed)
Med sent in.

## 2012-02-04 NOTE — Telephone Encounter (Signed)
Itching bad and she is raw where she has been scratching in her vaginal area. Said she has yeast infection and monistat not helping. Wants fluconazole sent in  [facility] [address]

## 2012-02-05 ENCOUNTER — Encounter: Payer: Self-pay | Admitting: Family Medicine

## 2012-02-05 ENCOUNTER — Ambulatory Visit (INDEPENDENT_AMBULATORY_CARE_PROVIDER_SITE_OTHER): Payer: Medicaid Other | Admitting: Family Medicine

## 2012-02-05 VITALS — BP 138/72 | HR 107 | Resp 18 | Ht 66.0 in | Wt 186.1 lb

## 2012-02-05 DIAGNOSIS — IMO0002 Reserved for concepts with insufficient information to code with codable children: Secondary | ICD-10-CM

## 2012-02-05 DIAGNOSIS — J45909 Unspecified asthma, uncomplicated: Secondary | ICD-10-CM

## 2012-02-05 DIAGNOSIS — M62838 Other muscle spasm: Secondary | ICD-10-CM

## 2012-02-05 DIAGNOSIS — I10 Essential (primary) hypertension: Secondary | ICD-10-CM

## 2012-02-05 MED ORDER — TIZANIDINE HCL 4 MG PO TABS: 4.0000 mg | ORAL_TABLET | Freq: Three times a day (TID) | ORAL | Status: DC

## 2012-02-05 MED ORDER — PREDNISONE (PAK) 5 MG PO TABS: 5.0000 mg | ORAL_TABLET | ORAL | Status: DC

## 2012-02-05 MED ORDER — ALBUTEROL SULFATE (2.5 MG/3ML) 0.083% IN NEBU: 2.5000 mg | INHALATION_SOLUTION | Freq: Four times a day (QID) | RESPIRATORY_TRACT | Status: DC | PRN

## 2012-02-05 MED ORDER — ALBUTEROL SULFATE HFA 108 (90 BASE) MCG/ACT IN AERS: 2.0000 | INHALATION_SPRAY | Freq: Four times a day (QID) | RESPIRATORY_TRACT | Status: DC | PRN

## 2012-02-05 MED ORDER — BUDESONIDE-FORMOTEROL FUMARATE 160-4.5 MCG/ACT IN AERO: 2.0000 | INHALATION_SPRAY | Freq: Two times a day (BID) | RESPIRATORY_TRACT | Status: DC

## 2012-02-05 MED ORDER — KETOROLAC TROMETHAMINE 60 MG/2ML IJ SOLN
60.0000 mg | Freq: Once | INTRAMUSCULAR | Status: AC
  Administered 2012-02-05: 60 mg via INTRAMUSCULAR

## 2012-02-05 MED ORDER — IPRATROPIUM BROMIDE 0.02 % IN SOLN: 500.0000 ug | Freq: Four times a day (QID) | RESPIRATORY_TRACT | Status: DC | PRN

## 2012-02-05 MED ORDER — METHYLPREDNISOLONE ACETATE 80 MG/ML IJ SUSP
80.0000 mg | Freq: Once | INTRAMUSCULAR | Status: AC
  Administered 2012-02-05: 80 mg via INTRAMUSCULAR

## 2012-02-05 NOTE — Progress Notes (Signed)
  Subjective:    Patient ID: Jane English, female    DOB: 1971-06-20, 41 y.o.   MRN: [id number]  HPI Pt in with a c/o increased wheezing and shortness of breath and uncontrolled asthma symptoms fpor the past week. She also c/o knee pain ad swelling with limitation in mobility and instability, had recently started zumba and feels this may be contributing  Review of Systems    See HPI Denies recent fever or chills. Denies sinus pressure, nasal congestion, ear pain or sore throat Denies chest pains, palpitations and leg swelling Denies abdominal pain, nausea, vomiting,diarrhea or constipation.   Denies dysuria, frequency, hesitancy or incontinence. . Denies headaches, seizures, numbness, or tingling. Denies uncontrolled  depression, anxiety or insomnia. Denies skin break down or rash.     Objective:   Physical Exam Patient alert and oriented and in no cardiopulmonary distress.  HEENT: No facial asymmetry, EOMI, no sinus tenderness,  oropharynx pink and moist.  Neck supple no adenopathy.  Chest: decreased air entry, no crackles few wheezes  CVS: S1, S2 no murmurs, no S3.  ABD: Soft non tender. Bowel sounds normal.  Ext: No edema  MS: decreased ROM   spine,  And right  knee.  Skin: Intact, no ulcerations or rash noted.  Psych: Good eye contact, normal affect. Memory intact not anxious or depressed appearing.  CNS: CN 2-12 intact, power, tone and sensation normal throughout.        Assessment & Plan:

## 2012-02-05 NOTE — Patient Instructions (Signed)
F/u as before, early June.  Toradol and depo medrol in the office for right knee pain. No zumba, water exercise and walking is less stressful on the knee.  You are referred to Dr Eduard Clos re chronic back pain.  Stop flexeril for spasm, new is zanaflex  Stop symbicort 80 and qvar, new are symbicort 160 and albuterol.

## 2012-02-12 NOTE — Assessment & Plan Note (Signed)
Uncontrolled, inc symbicort dose and stop qvar, also depo medrol and prednisone dose pack

## 2012-02-12 NOTE — Assessment & Plan Note (Signed)
Controlled, no change in medication  

## 2012-02-23 NOTE — Assessment & Plan Note (Signed)
Reports flexeril is not helping will switch to zannaflex

## 2012-02-24 ENCOUNTER — Other Ambulatory Visit (HOSPITAL_COMMUNITY): Payer: Self-pay | Admitting: Physical Medicine and Rehabilitation

## 2012-02-24 DIAGNOSIS — G894 Chronic pain syndrome: Secondary | ICD-10-CM

## 2012-02-24 DIAGNOSIS — M5137 Other intervertebral disc degeneration, lumbosacral region: Secondary | ICD-10-CM

## 2012-02-24 DIAGNOSIS — M961 Postlaminectomy syndrome, not elsewhere classified: Secondary | ICD-10-CM

## 2012-02-25 ENCOUNTER — Ambulatory Visit (HOSPITAL_COMMUNITY)
Admission: RE | Admit: 2012-02-25 | Discharge: 2012-02-25 | Disposition: A | Discharge status: Home / Self Care | Payer: Medicaid Other | Source: Ambulatory Visit | Attending: Physical Medicine and Rehabilitation | Admitting: Physical Medicine and Rehabilitation

## 2012-02-25 DIAGNOSIS — M5137 Other intervertebral disc degeneration, lumbosacral region: Secondary | ICD-10-CM

## 2012-02-25 DIAGNOSIS — M545 Low back pain, unspecified: Secondary | ICD-10-CM | POA: Insufficient documentation

## 2012-02-25 DIAGNOSIS — M961 Postlaminectomy syndrome, not elsewhere classified: Secondary | ICD-10-CM

## 2012-02-25 DIAGNOSIS — M79609 Pain in unspecified limb: Secondary | ICD-10-CM | POA: Insufficient documentation

## 2012-02-25 DIAGNOSIS — G894 Chronic pain syndrome: Secondary | ICD-10-CM

## 2012-02-25 LAB — POCT I-STAT, CHEM 8
Calcium, Ion: 1.18 mmol/L (ref 1.12–1.32)
Chloride: 104 mEq/L (ref 96–112)
Glucose, Bld: 86 mg/dL (ref 70–99)
HCT: 37 % (ref 36.0–46.0)

## 2012-02-25 MED ORDER — GADOBENATE DIMEGLUMINE 529 MG/ML IV SOLN
15.0000 mL | Freq: Once | INTRAVENOUS | Status: AC | PRN
  Administered 2012-02-25: 15 mL via INTRAVENOUS

## 2012-02-28 ENCOUNTER — Other Ambulatory Visit: Payer: Self-pay | Admitting: Family Medicine

## 2012-04-02 ENCOUNTER — Other Ambulatory Visit: Payer: Self-pay | Admitting: Family Medicine

## 2012-04-02 ENCOUNTER — Telehealth: Payer: Self-pay | Admitting: Family Medicine

## 2012-04-02 MED ORDER — ALPRAZOLAM ER 0.5 MG PO TB24: 0.5000 mg | ORAL_TABLET | Freq: Three times a day (TID) | ORAL | Status: DC

## 2012-04-02 NOTE — Telephone Encounter (Signed)
Refill sent in

## 2012-04-08 ENCOUNTER — Ambulatory Visit: Payer: Medicaid Other | Admitting: Family Medicine

## 2012-04-13 ENCOUNTER — Ambulatory Visit (INDEPENDENT_AMBULATORY_CARE_PROVIDER_SITE_OTHER): Payer: Medicaid Other | Admitting: Family Medicine

## 2012-04-13 ENCOUNTER — Encounter: Payer: Self-pay | Admitting: Family Medicine

## 2012-04-13 VITALS — BP 180/110 | HR 90 | Resp 18 | Ht 66.0 in | Wt 188.0 lb

## 2012-04-13 DIAGNOSIS — F32A Depression, unspecified: Secondary | ICD-10-CM | POA: Insufficient documentation

## 2012-04-13 DIAGNOSIS — I10 Essential (primary) hypertension: Secondary | ICD-10-CM

## 2012-04-13 DIAGNOSIS — R7309 Other abnormal glucose: Secondary | ICD-10-CM

## 2012-04-13 DIAGNOSIS — R7303 Prediabetes: Secondary | ICD-10-CM

## 2012-04-13 DIAGNOSIS — F3289 Other specified depressive episodes: Secondary | ICD-10-CM

## 2012-04-13 DIAGNOSIS — M5090 Cervical disc disorder, unspecified, unspecified cervical region: Secondary | ICD-10-CM

## 2012-04-13 DIAGNOSIS — F329 Major depressive disorder, single episode, unspecified: Secondary | ICD-10-CM

## 2012-04-13 LAB — BASIC METABOLIC PANEL
BUN: 11 mg/dL (ref 6–23)
CO2: 29 mEq/L (ref 19–32)
Chloride: 102 mEq/L (ref 96–112)
Glucose, Bld: 85 mg/dL (ref 70–99)
Potassium: 3.7 mEq/L (ref 3.5–5.3)
Sodium: 139 mEq/L (ref 135–145)

## 2012-04-13 MED ORDER — TRIAMTERENE-HCTZ 37.5-25 MG PO TABS: 1.0000 | ORAL_TABLET | Freq: Every day | ORAL | Status: DC

## 2012-04-13 MED ORDER — FLUOXETINE HCL 10 MG PO CAPS: 10.0000 mg | ORAL_CAPSULE | Freq: Every day | ORAL | Status: DC

## 2012-04-13 NOTE — Assessment & Plan Note (Signed)
rept HBa1C today, lifestyle management only at this time

## 2012-04-13 NOTE — Patient Instructions (Addendum)
F/u in 2 week. Your blood pressure is excessively high, new additional medication to be started today, maxzide , continue amlodipine  New medication for depression , fluoxetine daily.  You are referred for a scan of your neck, I believe your disc disease is worse.  HBA1C and chem 7 this morning

## 2012-04-13 NOTE — Assessment & Plan Note (Signed)
Uncontrolled resume med, situational stress

## 2012-04-13 NOTE — Progress Notes (Signed)
  Subjective:    Patient ID: Jane English, female    DOB: [birth year], 41 y.o.   MRN: [id number]  HPI The PT is here for follow up and re-evaluation of chronic medical conditions, medication management and review of any available recent lab and radiology data.  Preventive health is updated, specifically  Cancer screening and Immunization.   Questions or concerns regarding consultations or procedures which the PT has had in the interim are  addressed. The PT denies any adverse reactions to current medications since the last visit.  2 week h/o neck and left upper extremity pain and numbness and weakness, has past h/o disc disease in the neck. Getting epidurals for back pain C/o increased stress and depression due to problems in her family, interested in medication, getting counseling through her ChurchNot suicidal or homicidal     Review of Systems See HPI Denies recent fever or chills. Denies sinus pressure, nasal congestion, ear pain or sore throat. Denies chest congestion, productive cough or wheezing. Denies chest pains, palpitations and leg swelling Denies abdominal pain, nausea, vomiting,diarrhea or constipation.   Denies dysuria, frequency, hesitancy or incontinence. Denies headaches, seizures, numbness, or tingling.  Denies skin break down or rash.        Objective:   Physical Exam  Patient alert and oriented and in no cardiopulmonary distress.  HEENT: No facial asymmetry, EOMI, no sinus tenderness,  oropharynx pink and moist.  Neck decreased ROm no adenopathy.  Chest: Clear to auscultation bilaterally.  CVS: S1, S2 no murmurs, no S3.  ABD: Soft non tender. Bowel sounds normal.  Ext: No edema  MS: Adequate ROM spine, shoulders, hips and knees.  Skin: Intact, no ulcerations or rash noted.  Psych: Good eye contact, normal affect. Memory intact  depressed appearing.  CNS: CN 2-12 intact, decreased power and sensation in left upper extremity including weak  grip      Assessment & Plan:

## 2012-04-13 NOTE — Assessment & Plan Note (Signed)
Uncontrolled add maxzide and f/u in 2 weeks

## 2012-04-15 ENCOUNTER — Telehealth: Payer: Self-pay

## 2012-04-15 ENCOUNTER — Other Ambulatory Visit: Payer: Self-pay

## 2012-04-15 DIAGNOSIS — I10 Essential (primary) hypertension: Secondary | ICD-10-CM

## 2012-04-15 MED ORDER — TRIAMTERENE-HCTZ 37.5-25 MG PO TABS: 1.0000 | ORAL_TABLET | Freq: Every day | ORAL | Status: DC

## 2012-04-15 NOTE — Telephone Encounter (Signed)
Called patient and left message for them to return call at the office   

## 2012-04-15 NOTE — Telephone Encounter (Signed)
States she can not take the prozac so she wants it d/c'd. Wants to know if you can increase the xanax instead. Says she does fine with the xanax. Wants it sent to [facility]

## 2012-04-15 NOTE — Telephone Encounter (Signed)
She is already getting 3 xanax daily so no increase dose there. What happens with prozac, she needs an antidepressant, I suggest she try paxil 10mg  one daily, send in 30 with1 refill if she agrees in place of prozac

## 2012-04-16 NOTE — Telephone Encounter (Signed)
i suggest effexor ,let hr know different class

## 2012-04-16 NOTE — Telephone Encounter (Signed)
States she will try Effexor. Please send to [facility]

## 2012-04-16 NOTE — Telephone Encounter (Signed)
States she took paxil in the past and it made her spaced out and no emotion. Doesn't like feeling like that.

## 2012-04-17 MED ORDER — VENLAFAXINE HCL ER 37.5 MG PO CP24: 37.5000 mg | ORAL_CAPSULE | Freq: Every day | ORAL | Status: DC

## 2012-04-17 NOTE — Telephone Encounter (Signed)
Sent to [facility], though [facility] is listed as preference, unsure what's going on, may need to check with her

## 2012-04-17 NOTE — Telephone Encounter (Signed)
[facility] is correct. She did use [facility] but stated yesterday she wanted to use [facility] instead

## 2012-04-19 NOTE — Assessment & Plan Note (Signed)
Deterioration in symptoms of pain with numbness, needs MRI cervical spine

## 2012-04-20 ENCOUNTER — Ambulatory Visit (HOSPITAL_COMMUNITY): Admission: RE | Admit: 2012-04-20 | Payer: Medicaid Other | Source: Ambulatory Visit

## 2012-04-21 ENCOUNTER — Other Ambulatory Visit: Payer: Self-pay | Admitting: Family Medicine

## 2012-04-21 ENCOUNTER — Other Ambulatory Visit: Payer: Self-pay

## 2012-04-21 ENCOUNTER — Ambulatory Visit (HOSPITAL_COMMUNITY)
Admission: RE | Admit: 2012-04-21 | Discharge: 2012-04-21 | Disposition: A | Discharge status: Home / Self Care | Payer: Medicaid Other | Source: Ambulatory Visit | Attending: Family Medicine | Admitting: Family Medicine

## 2012-04-21 DIAGNOSIS — F32A Depression, unspecified: Secondary | ICD-10-CM

## 2012-04-21 DIAGNOSIS — M542 Cervicalgia: Secondary | ICD-10-CM | POA: Insufficient documentation

## 2012-04-21 DIAGNOSIS — M5090 Cervical disc disorder, unspecified, unspecified cervical region: Secondary | ICD-10-CM

## 2012-04-21 DIAGNOSIS — F329 Major depressive disorder, single episode, unspecified: Secondary | ICD-10-CM

## 2012-04-21 DIAGNOSIS — M5020 Other cervical disc displacement, unspecified cervical region: Secondary | ICD-10-CM | POA: Insufficient documentation

## 2012-04-21 DIAGNOSIS — M6281 Muscle weakness (generalized): Secondary | ICD-10-CM | POA: Insufficient documentation

## 2012-04-21 DIAGNOSIS — I10 Essential (primary) hypertension: Secondary | ICD-10-CM

## 2012-04-21 MED ORDER — VENLAFAXINE HCL ER 37.5 MG PO CP24: 37.5000 mg | ORAL_CAPSULE | Freq: Every day | ORAL | Status: DC

## 2012-04-21 MED ORDER — TRIAMTERENE-HCTZ 37.5-25 MG PO TABS: 1.0000 | ORAL_TABLET | Freq: Every day | ORAL | Status: DC

## 2012-04-27 ENCOUNTER — Ambulatory Visit (INDEPENDENT_AMBULATORY_CARE_PROVIDER_SITE_OTHER): Payer: Medicaid Other | Admitting: Family Medicine

## 2012-04-27 ENCOUNTER — Encounter: Payer: Self-pay | Admitting: Family Medicine

## 2012-04-27 VITALS — BP 170/90 | HR 83 | Resp 18 | Ht 66.0 in | Wt 184.0 lb

## 2012-04-27 DIAGNOSIS — M5090 Cervical disc disorder, unspecified, unspecified cervical region: Secondary | ICD-10-CM

## 2012-04-27 DIAGNOSIS — I10 Essential (primary) hypertension: Secondary | ICD-10-CM

## 2012-04-27 DIAGNOSIS — F411 Generalized anxiety disorder: Secondary | ICD-10-CM

## 2012-04-27 DIAGNOSIS — Z13220 Encounter for screening for lipoid disorders: Secondary | ICD-10-CM

## 2012-04-27 DIAGNOSIS — R51 Headache: Secondary | ICD-10-CM

## 2012-04-27 MED ORDER — TRIAMTERENE-HCTZ 75-50 MG PO TABS: 1.0000 | ORAL_TABLET | Freq: Every day | ORAL | Status: DC

## 2012-04-27 MED ORDER — AMLODIPINE BESYLATE 10 MG PO TABS: 10.0000 mg | ORAL_TABLET | Freq: Every day | ORAL | Status: DC

## 2012-04-27 MED ORDER — HYDROCODONE-ACETAMINOPHEN 5-500 MG PO TABS: ORAL_TABLET | ORAL | Status: DC

## 2012-04-27 MED ORDER — KETOROLAC TROMETHAMINE 60 MG/2ML IJ SOLN
60.0000 mg | Freq: Once | INTRAMUSCULAR | Status: AC
  Administered 2012-04-27: 60 mg via INTRAMUSCULAR

## 2012-04-27 MED ORDER — IBUPROFEN 800 MG PO TABS: 800.0000 mg | ORAL_TABLET | Freq: Two times a day (BID) | ORAL | Status: AC | PRN

## 2012-04-27 NOTE — Assessment & Plan Note (Signed)
Needs neurosurg eval

## 2012-04-27 NOTE — Patient Instructions (Addendum)
F/u in 5 weeks. Increase dose of maxzide for blood pressure, take tWO 25mg  tablets daily till done, new is 50mg  ONE daily  New medication for pain, vicodin, stop tramadol  You are referred to Dr Jeral Fruit about the neck pain  Toradol 60 mg IM for headache.  Brain scan ordered for headache  Fasting lipid and cmp in 5 weeks before f/u visit

## 2012-04-27 NOTE — Progress Notes (Signed)
  Subjective:    Patient ID: Jane English, female    DOB: Feb 15, 1971, 41 y.o.   MRN: [id number]  HPI Pt in for re eval of uncontrolled blood pressure , recent brain scan, and states in the past 3  weeks she has had the worst headache ever, constantly, unlike other headaches in the past. Does not seem like a migraine, nothing is relieving it. No c/o weakness, numbness or blurry vision Mri of neck shows disk disease with possible nerve compression. States she knows therapy will not help and wants referral to surgeon. Needs pain med other than tramadol, this is not helping her  Much Reports tolerance of and symptom improvement with efexor    Review of Systems See HPI Denies recent fever or chills. Denies sinus pressure, nasal congestion, ear pain or sore throat. Denies chest congestion, productive cough or wheezing. Denies chest pains, palpitations and leg swelling Denies abdominal pain, nausea, vomiting,diarrhea or constipation.   Denies dysuria, frequency, hesitancy or incontinence. . . Denies skin break down or rash.        Objective:   Physical Exam  Patient alert and oriented and in no cardiopulmonary distress.  HEENT: No facial asymmetry, EOMI, no sinus tenderness,  oropharynx pink and moist.  Neck decreased ROM no adenopathy.  Chest: Clear to auscultation bilaterally.  CVS: S1, S2 no murmurs, no S3.  ABD: Soft non tender. Bowel sounds normal.  Ext: No edema  MS: decreased  ROM spine,adequate in  shoulders, hips and knees.  Skin: Intact, no ulcerations or rash noted.  Psych: Good eye contact, normal affect. Memory intact not anxious or depressed appearing.  CNS: CN 2-12 intact, power, tone and sensation normal throughout.       Assessment & Plan:

## 2012-04-27 NOTE — Assessment & Plan Note (Signed)
3 week h/o increased severity and frequency of headache no relief, not like migraines assocd with left upper ext weakness and numbness

## 2012-04-27 NOTE — Assessment & Plan Note (Signed)
Uncontrolled med dose increase

## 2012-04-27 NOTE — Assessment & Plan Note (Signed)
Improved on current meds.

## 2012-05-12 ENCOUNTER — Telehealth: Payer: Self-pay | Admitting: Family Medicine

## 2012-05-13 ENCOUNTER — Telehealth: Payer: Self-pay | Admitting: Family Medicine

## 2012-05-19 ENCOUNTER — Telehealth: Payer: Self-pay | Admitting: Family Medicine

## 2012-05-19 MED ORDER — CETIRIZINE HCL 10 MG PO TABS: ORAL_TABLET | ORAL | Status: DC

## 2012-05-19 NOTE — Telephone Encounter (Signed)
Med sent.

## 2012-05-21 ENCOUNTER — Other Ambulatory Visit: Payer: Self-pay | Admitting: Neurosurgery

## 2012-05-21 DIAGNOSIS — M542 Cervicalgia: Secondary | ICD-10-CM

## 2012-05-21 DIAGNOSIS — M5410 Radiculopathy, site unspecified: Secondary | ICD-10-CM

## 2012-05-27 NOTE — Telephone Encounter (Signed)
Patient is aware 

## 2012-05-29 ENCOUNTER — Other Ambulatory Visit: Payer: Medicaid Other

## 2012-05-29 ENCOUNTER — Ambulatory Visit
Admission: RE | Admit: 2012-05-29 | Discharge: 2012-05-29 | Disposition: A | Discharge status: Home / Self Care | Payer: Medicaid Other | Source: Ambulatory Visit | Attending: Neurosurgery | Admitting: Neurosurgery

## 2012-05-29 VITALS — BP 120/65 | HR 93

## 2012-05-29 DIAGNOSIS — M542 Cervicalgia: Secondary | ICD-10-CM

## 2012-05-29 DIAGNOSIS — M5090 Cervical disc disorder, unspecified, unspecified cervical region: Secondary | ICD-10-CM

## 2012-05-29 DIAGNOSIS — M5410 Radiculopathy, site unspecified: Secondary | ICD-10-CM

## 2012-05-29 MED ORDER — DIAZEPAM 5 MG PO TABS
10.0000 mg | ORAL_TABLET | Freq: Once | ORAL | Status: AC
  Administered 2012-05-29: 10 mg via ORAL

## 2012-05-29 MED ORDER — ACETAMINOPHEN 500 MG PO TABS
1000.0000 mg | ORAL_TABLET | Freq: Once | ORAL | Status: AC
  Administered 2012-05-29: 1000 mg via ORAL

## 2012-05-29 MED ORDER — MEPERIDINE HCL 100 MG/ML IJ SOLN
75.0000 mg | Freq: Once | INTRAMUSCULAR | Status: AC
  Administered 2012-05-29: 75 mg via INTRAMUSCULAR

## 2012-05-29 MED ORDER — IOHEXOL 300 MG/ML  SOLN
10.0000 mL | Freq: Once | INTRAMUSCULAR | Status: AC | PRN
  Administered 2012-05-29: 10 mL via INTRATHECAL

## 2012-05-29 MED ORDER — ONDANSETRON HCL 4 MG/2ML IJ SOLN
4.0000 mg | Freq: Once | INTRAMUSCULAR | Status: AC
  Administered 2012-05-29: 4 mg via INTRAMUSCULAR

## 2012-05-29 NOTE — Progress Notes (Signed)
Pt had c/o headache post procedure and cold pack placed to neck, then pt asked for tylenol and iIt was given

## 2012-06-01 ENCOUNTER — Ambulatory Visit: Payer: Medicaid Other | Admitting: Family Medicine

## 2012-06-03 ENCOUNTER — Encounter: Payer: Self-pay | Admitting: Family Medicine

## 2012-06-03 ENCOUNTER — Ambulatory Visit (INDEPENDENT_AMBULATORY_CARE_PROVIDER_SITE_OTHER): Payer: Medicaid Other | Admitting: Family Medicine

## 2012-06-03 VITALS — BP 134/74 | HR 94 | Resp 18 | Ht 66.0 in | Wt 182.0 lb

## 2012-06-03 DIAGNOSIS — I10 Essential (primary) hypertension: Secondary | ICD-10-CM

## 2012-06-03 DIAGNOSIS — M62838 Other muscle spasm: Secondary | ICD-10-CM

## 2012-06-03 DIAGNOSIS — F32A Depression, unspecified: Secondary | ICD-10-CM

## 2012-06-03 DIAGNOSIS — R7309 Other abnormal glucose: Secondary | ICD-10-CM

## 2012-06-03 DIAGNOSIS — R7303 Prediabetes: Secondary | ICD-10-CM

## 2012-06-03 DIAGNOSIS — M5090 Cervical disc disorder, unspecified, unspecified cervical region: Secondary | ICD-10-CM

## 2012-06-03 DIAGNOSIS — F3289 Other specified depressive episodes: Secondary | ICD-10-CM

## 2012-06-03 DIAGNOSIS — F329 Major depressive disorder, single episode, unspecified: Secondary | ICD-10-CM

## 2012-06-03 DIAGNOSIS — J45909 Unspecified asthma, uncomplicated: Secondary | ICD-10-CM

## 2012-06-03 NOTE — Patient Instructions (Addendum)
CPE  In 4 months  hBa1C in 4 month  Blood pressure is good, no med changes  All the best with surgery  A healthy diet is rich in fruit, vegetables and whole grains. Poultry fish, nuts and beans are a healthy choice for protein rather then red meat. A low sodium diet and drinking 64 ounces of water daily is generally recommended. Oils and sweet should be limited. Carbohydrates especially for those who are diabetic or overweight, should be limited to 30-45 gram per meal. It is important to eat on a regular schedule, at least 3 times daily. Snacks should be primarily fruits, vegetables or nuts.   It is important that you exercise regularly at least  45 minutes 5 times a week. If you develop chest pain, have severe difficulty breathing, or feel very tired, stop exercising immediately and seek medical attention

## 2012-06-04 MED ORDER — IBUPROFEN 800 MG PO TABS: 800.0000 mg | ORAL_TABLET | Freq: Three times a day (TID) | ORAL | Status: AC | PRN

## 2012-06-04 MED ORDER — ALBUTEROL SULFATE (2.5 MG/3ML) 0.083% IN NEBU: 2.5000 mg | INHALATION_SOLUTION | Freq: Four times a day (QID) | RESPIRATORY_TRACT | Status: DC | PRN

## 2012-06-04 MED ORDER — IPRATROPIUM BROMIDE 0.02 % IN SOLN: 500.0000 ug | Freq: Four times a day (QID) | RESPIRATORY_TRACT | Status: DC | PRN

## 2012-06-04 MED ORDER — ALBUTEROL SULFATE HFA 108 (90 BASE) MCG/ACT IN AERS: 2.0000 | INHALATION_SPRAY | Freq: Four times a day (QID) | RESPIRATORY_TRACT | Status: DC | PRN

## 2012-06-04 MED ORDER — BUDESONIDE-FORMOTEROL FUMARATE 160-4.5 MCG/ACT IN AERO: 2.0000 | INHALATION_SPRAY | Freq: Two times a day (BID) | RESPIRATORY_TRACT | Status: DC

## 2012-06-07 NOTE — Assessment & Plan Note (Signed)
Unchanged Patient educated about the importance of limiting  Carbohydrate intake , the need to commit to daily physical activity for a minimum of 30 minutes , and to commit weight loss. The fact that changes in all these areas will reduce or eliminate all together the development of diabetes is stressed.    

## 2012-06-07 NOTE — Progress Notes (Signed)
  Subjective:    Patient ID: Jane English, female    DOB: [birth year], 41 y.o.   MRN: [id number]  HPI The PT is here for follow up of uncontrolled hypertension and re-evaluation of chronic medical conditions, medication management and review of any available recent lab and radiology data.  Preventive health is updated, specifically  Cancer screening and Immunization.   Questions or concerns regarding consultations or procedures which the PT has had in the interim are  Addressed.Has upcoming neck surgery The PT denies any adverse reactions to current medications since the last visit.     Review of Systems See HPI Denies recent fever or chills. Denies sinus pressure, nasal congestion, ear pain or sore throat. Denies chest congestion, productive cough or wheezing. Denies chest pains, palpitations and leg swelling Denies abdominal pain, nausea, vomiting,diarrhea or constipation.   Denies dysuria, frequency, hesitancy or incontinence. Denies headaches, seizures, numbness, or tingling. Denies uncontrolled  depression, anxiety or insomnia. Denies skin break down or rash.        Objective:   Physical Exam Patient alert and oriented and in no cardiopulmonary distress.  HEENT: No facial asymmetry, EOMI, no sinus tenderness,  oropharynx pink and moist.  Neck supple no adenopathy.  Chest: Clear to auscultation bilaterally.  CVS: S1, S2 no murmurs, no S3.  ABD: Soft non tender. Bowel sounds normal.  Ext: No edema  WU:[id number] ROM spine,adequate in  shoulders, hips and knees.  Skin: Intact, no ulcerations or rash noted.  Psych: Good eye contact, normal affect. Memory intact not anxious or depressed appearing.  CNS: CN 2-12 intact, power, tone and sensation normal throughout.       Assessment & Plan:

## 2012-06-07 NOTE — Assessment & Plan Note (Signed)
Has upcoming c spine surgery to address this

## 2012-06-07 NOTE — Assessment & Plan Note (Signed)
Controlled, no change in medication  

## 2012-06-07 NOTE — Assessment & Plan Note (Signed)
Controlled, no change in medication DASH diet and commitment to daily physical activity for a minimum of 30 minutes discussed and encouraged, as a part of hypertension management. The importance of attaining a healthy weight is also discussed.  

## 2012-06-10 ENCOUNTER — Other Ambulatory Visit: Payer: Self-pay | Admitting: Neurosurgery

## 2012-06-23 ENCOUNTER — Encounter (HOSPITAL_COMMUNITY): Payer: Self-pay | Admitting: Respiratory Therapy

## 2012-06-30 ENCOUNTER — Encounter (HOSPITAL_COMMUNITY)
Admission: RE | Admit: 2012-06-30 | Discharge: 2012-06-30 | Disposition: A | Discharge status: Home / Self Care | Payer: Medicare Other | Source: Ambulatory Visit | Attending: Neurosurgery | Admitting: Neurosurgery

## 2012-06-30 ENCOUNTER — Encounter (HOSPITAL_COMMUNITY): Payer: Self-pay

## 2012-06-30 DIAGNOSIS — Z01811 Encounter for preprocedural respiratory examination: Secondary | ICD-10-CM | POA: Diagnosis not present

## 2012-06-30 DIAGNOSIS — M5020 Other cervical disc displacement, unspecified cervical region: Secondary | ICD-10-CM | POA: Diagnosis not present

## 2012-06-30 DIAGNOSIS — J45909 Unspecified asthma, uncomplicated: Secondary | ICD-10-CM | POA: Diagnosis not present

## 2012-06-30 DIAGNOSIS — M5090 Cervical disc disorder, unspecified, unspecified cervical region: Secondary | ICD-10-CM | POA: Diagnosis not present

## 2012-06-30 DIAGNOSIS — M5030 Other cervical disc degeneration, unspecified cervical region: Secondary | ICD-10-CM | POA: Diagnosis not present

## 2012-06-30 DIAGNOSIS — Z86718 Personal history of other venous thrombosis and embolism: Secondary | ICD-10-CM | POA: Diagnosis not present

## 2012-06-30 HISTORY — DX: Cardiac murmur, unspecified: R01.1

## 2012-06-30 HISTORY — DX: Depression, unspecified: F32.A

## 2012-06-30 HISTORY — DX: Shortness of breath: R06.02

## 2012-06-30 HISTORY — DX: Unspecified asthma, uncomplicated: J45.909

## 2012-06-30 HISTORY — DX: Major depressive disorder, single episode, unspecified: F32.9

## 2012-06-30 HISTORY — DX: Bronchitis, not specified as acute or chronic: J40

## 2012-06-30 LAB — CBC
Hemoglobin: 13.3 g/dL (ref 12.0–15.0)
MCH: 30.3 pg (ref 26.0–34.0)
MCV: 87.9 fL (ref 78.0–100.0)
Platelets: 339 10*3/uL (ref 150–400)
RBC: 4.39 MIL/uL (ref 3.87–5.11)

## 2012-06-30 LAB — BASIC METABOLIC PANEL
CO2: 26 mEq/L (ref 19–32)
Calcium: 9.8 mg/dL (ref 8.4–10.5)
Glucose, Bld: 104 mg/dL — ABNORMAL HIGH (ref 70–99)
Sodium: 139 mEq/L (ref 135–145)

## 2012-06-30 NOTE — Pre-Procedure Instructions (Signed)
[address] Jane English  06/30/2012   Your procedure is scheduled on:  07-07-2012  Report to [facility] at 5:30 AM.  Call this number if you have problems the morning of surgery: [phone]   Remember:   Do not eat food or drink:After Midnight.  .   Take these medicines the morning of surgery with A SIP OF WATER: Inhalers as needed and instructed by MD,Amlodipine,painvmedication as needed,Zyrtec(cetirizine)flonase nasal spray, eye drops,topamax   Do not wear jewelry, make-up or nail polish.  Do not wear lotions, powders, or perfumes. You may wear deodorant.  Do not shave 48 hours prior to surgery. Men may shave face and neck.  Do not bring valuables to the hospital.  Contacts, dentures or bridgework may not be worn into surgery.  Leave suitcase in the car. After surgery it may be brought to your room.  For patients admitted to the hospital, checkout time is 11:00 AM the day of discharge.   Marland Kitchen    Special Instructions: CHG Shower Use Special Wash: 1/2 bottle night before surgery and 1/2 bottle morning of surgery.   Please read over the following fact sheets that you were given: Pain Booklet, MRSA Information and Surgical Site Infection Prevention

## 2012-07-01 ENCOUNTER — Telehealth: Payer: Self-pay | Admitting: Family Medicine

## 2012-07-01 NOTE — Telephone Encounter (Signed)
PLS CALL AND GIVE VERBAL ORDER AND FAX BACK d/c REQUEST FOR HYDROCODONE FROM [facility] OFFICE.  CALLED TODAY , WAS ON HOLD FOR OVER 7 MINUTES.  tHIS IS IMPORTANT

## 2012-07-01 NOTE — Consult Note (Signed)
Anesthesia Chart Review:  Patient is a 41 year old female scheduled for C4-5, C5-6 ACDF on 07/07/12 by Dr. Jeral Fruit.  History includes non-smoker, DVT '10, HTN, obesity, impaired fasting glucose, migraines, asthma, SOB, murmur, depression, bronchitis, prior lumbar surgery.  PCP is Dr. Syliva Overman.    EKG on 06/30/12 showed NSR.    Normal CXR on 06/30/12.    Labs noted.  K 2.9.  Meds include Maxzide which could be contributing.  I called this result to Shanda Bumps at Dr. Cassandria Santee office so he can address or relay/defer to Dr. Lodema Hong.  I will order an ISTAT on arrival to re-evaluate her hypokalemia.  Shonna Chock, PA-C

## 2012-07-03 ENCOUNTER — Other Ambulatory Visit: Payer: Self-pay | Admitting: Family Medicine

## 2012-07-03 ENCOUNTER — Telehealth: Payer: Self-pay | Admitting: Family Medicine

## 2012-07-03 DIAGNOSIS — E876 Hypokalemia: Secondary | ICD-10-CM

## 2012-07-03 MED ORDER — POTASSIUM CHLORIDE 20 MEQ/15ML (10%) PO LIQD: ORAL | Status: DC

## 2012-07-03 NOTE — Addendum Note (Signed)
Addended by: Abner Greenspan on: 07/03/2012 10:49 AM   Modules accepted: Orders

## 2012-07-03 NOTE — Telephone Encounter (Signed)
Sent in liquid, she also needs chem 7 stat next week Tuesday m,orning please order and let her know

## 2012-07-03 NOTE — Telephone Encounter (Signed)
Potassium low at 2.9 from outside office needs potassium script entered fax after you spk with her please Note takes 2 twice daily for 5 days then 1 twice daily, explain  I have tried to call unable , important, get help from front staff with contact if necessary please

## 2012-07-03 NOTE — Telephone Encounter (Signed)
Done

## 2012-07-03 NOTE — Telephone Encounter (Signed)
FYI- She is having surgery on tues, states she will go first thing Monday.

## 2012-07-03 NOTE — Telephone Encounter (Signed)
Said she can't take the pills- they are too large. Said to call in the liquid and she will go pick it up

## 2012-07-03 NOTE — Telephone Encounter (Signed)
Pt to go Monday to the aph lab

## 2012-07-03 NOTE — Telephone Encounter (Signed)
Will need to have blood drawn at hospital, let her know, lab closed for holiday

## 2012-07-03 NOTE — Telephone Encounter (Signed)
Done and pharmacy aware

## 2012-07-06 LAB — BASIC METABOLIC PANEL
CO2: 26 mEq/L (ref 19–32)
Calcium: 9.6 mg/dL (ref 8.4–10.5)
Creat: 1 mg/dL (ref 0.50–1.10)
Glucose, Bld: 111 mg/dL — ABNORMAL HIGH (ref 70–99)

## 2012-07-06 MED ORDER — VANCOMYCIN HCL IN DEXTROSE 1-5 GM/200ML-% IV SOLN
1000.0000 mg | INTRAVENOUS | Status: AC
  Administered 2012-07-07: 1000 mg via INTRAVENOUS
  Filled 2012-07-06: qty 200

## 2012-07-07 ENCOUNTER — Encounter (HOSPITAL_COMMUNITY): Payer: Self-pay | Admitting: Vascular Surgery

## 2012-07-07 ENCOUNTER — Encounter (HOSPITAL_COMMUNITY)
Admission: RE | Disposition: A | Discharge status: Home / Self Care | Payer: Self-pay | Source: Ambulatory Visit | Attending: Neurosurgery

## 2012-07-07 ENCOUNTER — Inpatient Hospital Stay (HOSPITAL_COMMUNITY): Payer: Medicare Other

## 2012-07-07 ENCOUNTER — Encounter (HOSPITAL_COMMUNITY): Payer: Self-pay | Admitting: Anesthesiology

## 2012-07-07 ENCOUNTER — Inpatient Hospital Stay (HOSPITAL_COMMUNITY): Payer: Medicare Other | Admitting: Vascular Surgery

## 2012-07-07 ENCOUNTER — Inpatient Hospital Stay (HOSPITAL_COMMUNITY)
Admission: RE | Admit: 2012-07-07 | Discharge: 2012-07-10 | DRG: 473 | Disposition: A | Discharge status: Home / Self Care | Payer: Medicare Other | Source: Ambulatory Visit | Attending: Neurosurgery | Admitting: Neurosurgery

## 2012-07-07 DIAGNOSIS — Z86718 Personal history of other venous thrombosis and embolism: Secondary | ICD-10-CM

## 2012-07-07 DIAGNOSIS — M5090 Cervical disc disorder, unspecified, unspecified cervical region: Secondary | ICD-10-CM | POA: Diagnosis not present

## 2012-07-07 DIAGNOSIS — M5412 Radiculopathy, cervical region: Secondary | ICD-10-CM | POA: Diagnosis not present

## 2012-07-07 DIAGNOSIS — M5020 Other cervical disc displacement, unspecified cervical region: Secondary | ICD-10-CM | POA: Diagnosis present

## 2012-07-07 DIAGNOSIS — M5030 Other cervical disc degeneration, unspecified cervical region: Secondary | ICD-10-CM | POA: Diagnosis not present

## 2012-07-07 DIAGNOSIS — J309 Allergic rhinitis, unspecified: Secondary | ICD-10-CM

## 2012-07-07 DIAGNOSIS — J45909 Unspecified asthma, uncomplicated: Secondary | ICD-10-CM | POA: Diagnosis present

## 2012-07-07 DIAGNOSIS — M4802 Spinal stenosis, cervical region: Secondary | ICD-10-CM | POA: Diagnosis not present

## 2012-07-07 DIAGNOSIS — I10 Essential (primary) hypertension: Secondary | ICD-10-CM

## 2012-07-07 HISTORY — PX: ANTERIOR CERVICAL DECOMP/DISCECTOMY FUSION: SHX1161

## 2012-07-07 SURGERY — ANTERIOR CERVICAL DECOMPRESSION/DISCECTOMY FUSION 2 LEVELS
Anesthesia: General | Site: Neck | Wound class: Clean

## 2012-07-07 MED ORDER — CEFAZOLIN SODIUM 1-5 GM-% IV SOLN: 1.0000 g | Freq: Three times a day (TID) | INTRAVENOUS | Status: DC

## 2012-07-07 MED ORDER — SODIUM CHLORIDE 0.9 % IJ SOLN
3.0000 mL | Freq: Two times a day (BID) | INTRAMUSCULAR | Status: DC
  Administered 2012-07-08 – 2012-07-10 (×3): 3 mL via INTRAVENOUS

## 2012-07-07 MED ORDER — SODIUM CHLORIDE 0.9 % IJ SOLN: 3.0000 mL | INTRAMUSCULAR | Status: DC | PRN

## 2012-07-07 MED ORDER — HYDROMORPHONE HCL PF 1 MG/ML IJ SOLN
0.2500 mg | INTRAMUSCULAR | Status: DC | PRN
  Administered 2012-07-07: 0.5 mg via INTRAVENOUS

## 2012-07-07 MED ORDER — TRIAMTERENE-HCTZ 75-50 MG PO TABS
1.0000 | ORAL_TABLET | Freq: Every day | ORAL | Status: DC
  Administered 2012-07-08 – 2012-07-10 (×3): 1 via ORAL
  Filled 2012-07-07 (×4): qty 1

## 2012-07-07 MED ORDER — MENTHOL 3 MG MT LOZG
1.0000 | LOZENGE | OROMUCOSAL | Status: DC | PRN
  Filled 2012-07-07: qty 9

## 2012-07-07 MED ORDER — IPRATROPIUM BROMIDE 0.02 % IN SOLN: 500.0000 ug | Freq: Four times a day (QID) | RESPIRATORY_TRACT | Status: DC | PRN

## 2012-07-07 MED ORDER — LIDOCAINE HCL 1 % IJ SOLN
INTRAMUSCULAR | Status: DC | PRN
  Administered 2012-07-07: 100 mg via INTRADERMAL

## 2012-07-07 MED ORDER — GLYCOPYRROLATE 0.2 MG/ML IJ SOLN
INTRAMUSCULAR | Status: DC | PRN
  Administered 2012-07-07: .4 mg via INTRAVENOUS

## 2012-07-07 MED ORDER — NEOSTIGMINE METHYLSULFATE 1 MG/ML IJ SOLN
INTRAMUSCULAR | Status: DC | PRN
  Administered 2012-07-07: 4 mg via INTRAVENOUS

## 2012-07-07 MED ORDER — PHENOL 1.4 % MT LIQD: 1.0000 | OROMUCOSAL | Status: DC | PRN

## 2012-07-07 MED ORDER — PROPOFOL 10 MG/ML IV BOLUS
INTRAVENOUS | Status: DC | PRN
  Administered 2012-07-07: 150 mg via INTRAVENOUS
  Administered 2012-07-07: 50 mg via INTRAVENOUS

## 2012-07-07 MED ORDER — THROMBIN 5000 UNITS EX SOLR
CUTANEOUS | Status: DC | PRN
  Administered 2012-07-07 (×3): 5000 [IU] via TOPICAL

## 2012-07-07 MED ORDER — SODIUM CHLORIDE 0.9 % IV SOLN: INTRAVENOUS | Status: DC

## 2012-07-07 MED ORDER — ACETAMINOPHEN 325 MG PO TABS: 650.0000 mg | ORAL_TABLET | ORAL | Status: DC | PRN

## 2012-07-07 MED ORDER — BUDESONIDE-FORMOTEROL FUMARATE 160-4.5 MCG/ACT IN AERO
2.0000 | INHALATION_SPRAY | Freq: Two times a day (BID) | RESPIRATORY_TRACT | Status: DC
  Administered 2012-07-07 – 2012-07-10 (×6): 2 via RESPIRATORY_TRACT
  Filled 2012-07-07 (×2): qty 6

## 2012-07-07 MED ORDER — ALBUTEROL SULFATE HFA 108 (90 BASE) MCG/ACT IN AERS
2.0000 | INHALATION_SPRAY | Freq: Four times a day (QID) | RESPIRATORY_TRACT | Status: DC | PRN
  Administered 2012-07-08 – 2012-07-10 (×4): 2 via RESPIRATORY_TRACT
  Filled 2012-07-07 (×2): qty 6.7

## 2012-07-07 MED ORDER — DIAZEPAM 5 MG PO TABS
5.0000 mg | ORAL_TABLET | Freq: Four times a day (QID) | ORAL | Status: DC | PRN
  Administered 2012-07-07 – 2012-07-10 (×9): 5 mg via ORAL
  Filled 2012-07-07 (×10): qty 1

## 2012-07-07 MED ORDER — ACETAMINOPHEN 10 MG/ML IV SOLN
INTRAVENOUS | Status: AC
  Administered 2012-07-07: 1000 mg via INTRAVENOUS
  Filled 2012-07-07: qty 100

## 2012-07-07 MED ORDER — MONTELUKAST SODIUM 10 MG PO TABS
10.0000 mg | ORAL_TABLET | Freq: Every day | ORAL | Status: DC
  Administered 2012-07-07 – 2012-07-09 (×3): 10 mg via ORAL
  Filled 2012-07-07 (×4): qty 1

## 2012-07-07 MED ORDER — AMLODIPINE BESYLATE 10 MG PO TABS
10.0000 mg | ORAL_TABLET | Freq: Every day | ORAL | Status: DC
  Administered 2012-07-08 – 2012-07-10 (×3): 10 mg via ORAL
  Filled 2012-07-07 (×4): qty 1

## 2012-07-07 MED ORDER — ONDANSETRON HCL 4 MG/2ML IJ SOLN: 4.0000 mg | INTRAMUSCULAR | Status: DC | PRN

## 2012-07-07 MED ORDER — LIDOCAINE HCL 4 % MT SOLN
OROMUCOSAL | Status: DC | PRN
  Administered 2012-07-07: 4 mL via TOPICAL

## 2012-07-07 MED ORDER — MORPHINE SULFATE 4 MG/ML IJ SOLN
4.0000 mg | INTRAMUSCULAR | Status: DC | PRN
  Administered 2012-07-07 – 2012-07-08 (×7): 4 mg via INTRAVENOUS
  Filled 2012-07-07 (×7): qty 1

## 2012-07-07 MED ORDER — OLOPATADINE HCL 0.1 % OP SOLN
1.0000 [drp] | Freq: Every day | OPHTHALMIC | Status: DC
  Administered 2012-07-08 – 2012-07-10 (×3): 1 [drp] via OPHTHALMIC
  Filled 2012-07-07: qty 5

## 2012-07-07 MED ORDER — LACTATED RINGERS IV SOLN
INTRAVENOUS | Status: DC | PRN
  Administered 2012-07-07 (×2): via INTRAVENOUS

## 2012-07-07 MED ORDER — ROCURONIUM BROMIDE 100 MG/10ML IV SOLN
INTRAVENOUS | Status: DC | PRN
  Administered 2012-07-07: 50 mg via INTRAVENOUS

## 2012-07-07 MED ORDER — ZOLPIDEM TARTRATE 10 MG PO TABS: 10.0000 mg | ORAL_TABLET | Freq: Every evening | ORAL | Status: DC | PRN

## 2012-07-07 MED ORDER — HEMOSTATIC AGENTS (NO CHARGE) OPTIME
TOPICAL | Status: DC | PRN
  Administered 2012-07-07: 1 via TOPICAL

## 2012-07-07 MED ORDER — DEXAMETHASONE SODIUM PHOSPHATE 4 MG/ML IJ SOLN
4.0000 mg | Freq: Four times a day (QID) | INTRAMUSCULAR | Status: DC
  Administered 2012-07-07 – 2012-07-08 (×3): 4 mg via INTRAVENOUS
  Filled 2012-07-07 (×19): qty 1

## 2012-07-07 MED ORDER — OXYCODONE-ACETAMINOPHEN 5-325 MG PO TABS
1.0000 | ORAL_TABLET | ORAL | Status: DC | PRN
  Filled 2012-07-07: qty 2

## 2012-07-07 MED ORDER — ACYCLOVIR 800 MG PO TABS
800.0000 mg | ORAL_TABLET | Freq: Three times a day (TID) | ORAL | Status: DC
  Administered 2012-07-07 – 2012-07-10 (×9): 800 mg via ORAL
  Filled 2012-07-07 (×13): qty 1

## 2012-07-07 MED ORDER — ONDANSETRON HCL 4 MG/2ML IJ SOLN
INTRAMUSCULAR | Status: DC | PRN
  Administered 2012-07-07: 4 mg via INTRAVENOUS

## 2012-07-07 MED ORDER — VANCOMYCIN HCL IN DEXTROSE 1-5 GM/200ML-% IV SOLN
1000.0000 mg | Freq: Once | INTRAVENOUS | Status: AC
  Administered 2012-07-07: 1000 mg via INTRAVENOUS
  Filled 2012-07-07: qty 200

## 2012-07-07 MED ORDER — DEXAMETHASONE SODIUM PHOSPHATE 4 MG/ML IJ SOLN
INTRAMUSCULAR | Status: DC | PRN
  Administered 2012-07-07: 8 mg via INTRAVENOUS

## 2012-07-07 MED ORDER — SODIUM CHLORIDE 0.9 % IV SOLN: 250.0000 mL | INTRAVENOUS | Status: DC

## 2012-07-07 MED ORDER — ACETAMINOPHEN 650 MG RE SUPP: 650.0000 mg | RECTAL | Status: DC | PRN

## 2012-07-07 MED ORDER — MIDAZOLAM HCL 5 MG/5ML IJ SOLN
INTRAMUSCULAR | Status: DC | PRN
  Administered 2012-07-07: 2 mg via INTRAVENOUS

## 2012-07-07 MED ORDER — 0.9 % SODIUM CHLORIDE (POUR BTL) OPTIME
TOPICAL | Status: DC | PRN
  Administered 2012-07-07: 1000 mL

## 2012-07-07 MED ORDER — HYDROMORPHONE HCL PF 1 MG/ML IJ SOLN
INTRAMUSCULAR | Status: AC
  Filled 2012-07-07: qty 1

## 2012-07-07 MED ORDER — FENTANYL CITRATE 0.05 MG/ML IJ SOLN
INTRAMUSCULAR | Status: DC | PRN
  Administered 2012-07-07: 50 ug via INTRAVENOUS
  Administered 2012-07-07 (×2): 100 ug via INTRAVENOUS
  Administered 2012-07-07: 150 ug via INTRAVENOUS

## 2012-07-07 MED ORDER — DEXAMETHASONE 4 MG PO TABS
4.0000 mg | ORAL_TABLET | Freq: Four times a day (QID) | ORAL | Status: DC
  Administered 2012-07-08 – 2012-07-10 (×9): 4 mg via ORAL
  Filled 2012-07-07 (×16): qty 1

## 2012-07-07 MED ORDER — TOPIRAMATE 25 MG PO TABS
50.0000 mg | ORAL_TABLET | Freq: Two times a day (BID) | ORAL | Status: DC
  Administered 2012-07-07 – 2012-07-10 (×6): 50 mg via ORAL
  Filled 2012-07-07 (×8): qty 2

## 2012-07-07 MED ORDER — ALBUMIN HUMAN 5 % IV SOLN
INTRAVENOUS | Status: DC | PRN
  Administered 2012-07-07: 09:00:00 via INTRAVENOUS

## 2012-07-07 MED ORDER — ARTIFICIAL TEARS OP OINT
TOPICAL_OINTMENT | OPHTHALMIC | Status: DC | PRN
  Administered 2012-07-07: 1 via OPHTHALMIC

## 2012-07-07 SURGICAL SUPPLY — 50 items
BANDAGE GAUZE ELAST BULKY 4 IN (GAUZE/BANDAGES/DRESSINGS) ×4 IMPLANT
BENZOIN TINCTURE PRP APPL 2/3 (GAUZE/BANDAGES/DRESSINGS) ×2 IMPLANT
BLADE ULTRA TIP 2M (BLADE) ×2 IMPLANT
BUR BARREL STRAIGHT FLUTE 4.0 (BURR) IMPLANT
BUR MATCHSTICK NEURO 3.0 LAGG (BURR) ×2 IMPLANT
CANISTER SUCTION 2500CC (MISCELLANEOUS) ×2 IMPLANT
CLOTH BEACON ORANGE TIMEOUT ST (SAFETY) ×2 IMPLANT
CONT SPEC 4OZ CLIKSEAL STRL BL (MISCELLANEOUS) ×2 IMPLANT
COVER MAYO STAND STRL (DRAPES) ×2 IMPLANT
DRAPE LAPAROTOMY 100X72 PEDS (DRAPES) ×2 IMPLANT
DRAPE MICROSCOPE LEICA (MISCELLANEOUS) ×2 IMPLANT
DRAPE POUCH INSTRU U-SHP 10X18 (DRAPES) ×2 IMPLANT
DURAPREP 6ML APPLICATOR 50/CS (WOUND CARE) ×2 IMPLANT
ELECT REM PT RETURN 9FT ADLT (ELECTROSURGICAL) ×2
ELECTRODE REM PT RTRN 9FT ADLT (ELECTROSURGICAL) ×1 IMPLANT
GAUZE SPONGE 4X4 16PLY XRAY LF (GAUZE/BANDAGES/DRESSINGS) IMPLANT
GLOVE BIOGEL M 8.0 STRL (GLOVE) ×2 IMPLANT
GLOVE ECLIPSE 6.5 STRL STRAW (GLOVE) ×4 IMPLANT
GLOVE ECLIPSE 7.5 STRL STRAW (GLOVE) ×8 IMPLANT
GLOVE EXAM NITRILE LRG STRL (GLOVE) IMPLANT
GLOVE EXAM NITRILE MD LF STRL (GLOVE) IMPLANT
GLOVE EXAM NITRILE XL STR (GLOVE) IMPLANT
GLOVE EXAM NITRILE XS STR PU (GLOVE) IMPLANT
GLOVE INDICATOR 8.0 STRL GRN (GLOVE) ×2 IMPLANT
GOWN BRE IMP SLV AUR LG STRL (GOWN DISPOSABLE) ×4 IMPLANT
GOWN BRE IMP SLV AUR XL STRL (GOWN DISPOSABLE) IMPLANT
GOWN STRL REIN 2XL LVL4 (GOWN DISPOSABLE) ×4 IMPLANT
HEAD HALTER (SOFTGOODS) ×2 IMPLANT
HEMOSTAT POWDER KIT SURGIFOAM (HEMOSTASIS) IMPLANT
HEMOSTAT POWDER SURGIFOAM 1G (HEMOSTASIS) ×2 IMPLANT
KIT BASIN OR (CUSTOM PROCEDURE TRAY) ×2 IMPLANT
KIT ROOM TURNOVER OR (KITS) ×2 IMPLANT
NS IRRIG 1000ML POUR BTL (IV SOLUTION) ×2 IMPLANT
PACK LAMINECTOMY NEURO (CUSTOM PROCEDURE TRAY) ×2 IMPLANT
PATTIES SURGICAL .5 X1 (DISPOSABLE) ×2 IMPLANT
PLATE ANT CERV XTEND 2 LV 30 (Plate) ×2 IMPLANT
PUTTY BONE GRAFT KIT 2.5ML (Bone Implant) ×2 IMPLANT
RUBBERBAND STERILE (MISCELLANEOUS) ×4 IMPLANT
SCREW XTD VAR 4.2 SELF TAP 12 (Screw) ×12 IMPLANT
SPACER ACDF SM LORDOTIC 7 (Spacer) ×4 IMPLANT
SPONGE GAUZE 4X4 12PLY (GAUZE/BANDAGES/DRESSINGS) ×2 IMPLANT
SPONGE INTESTINAL PEANUT (DISPOSABLE) ×2 IMPLANT
SPONGE SURGIFOAM ABS GEL SZ50 (HEMOSTASIS) ×2 IMPLANT
STRIP CLOSURE SKIN 1/2X4 (GAUZE/BANDAGES/DRESSINGS) ×2 IMPLANT
SUT VIC AB 3-0 SH 8-18 (SUTURE) ×2 IMPLANT
SYR 20ML ECCENTRIC (SYRINGE) ×2 IMPLANT
TAPE CLOTH SURG 4X10 WHT LF (GAUZE/BANDAGES/DRESSINGS) ×2 IMPLANT
TOWEL OR 17X24 6PK STRL BLUE (TOWEL DISPOSABLE) ×2 IMPLANT
TOWEL OR 17X26 10 PK STRL BLUE (TOWEL DISPOSABLE) ×2 IMPLANT
WATER STERILE IRR 1000ML POUR (IV SOLUTION) ×2 IMPLANT

## 2012-07-07 NOTE — Preoperative (Signed)
Beta Blockers   Reason not to administer Beta Blockers:Not Applicable 

## 2012-07-07 NOTE — Progress Notes (Signed)
UR COMPLETED  

## 2012-07-07 NOTE — Anesthesia Postprocedure Evaluation (Signed)
  Anesthesia Post-op Note  Patient: Jane English  Procedure(s) Performed: Procedure(s) (LRB) with comments: ANTERIOR CERVICAL DECOMPRESSION/DISCECTOMY FUSION 2 LEVELS (N/A) - Cervical four-five, five - six  Anterior cervical decompression/diskectomy/fusion/plate  Patient Location: PACU  Anesthesia Type: General  Level of Consciousness: awake  Airway and Oxygen Therapy: Patient Spontanous Breathing  Post-op Pain: mild  Post-op Assessment: Post-op Vital signs reviewed  Post-op Vital Signs: Reviewed  Complications: No apparent anesthesia complications

## 2012-07-07 NOTE — H&P (Signed)
Jane English is an 41 y.o. female.   Chief Complaint: neck pain HPI: neck pain with radiation to both upper extremities not bettr with conservative treatment,diological studies showed ddd at c45 and 56  Past Medical History  Diagnosis Date  . Migraine headache   . Back pain   . Obesity   . Hypertension   . DVT (deep venous thrombosis) 2010  . Heart murmur     no cardiologist  . Asthma   . Bronchitis   . Shortness of breath     if having asthma attack  . Depression     Past Surgical History  Procedure Date  . Appendectomy   . Carpal tunnel release   . Tubal ligation   . Lumbar spine surgery 2010    x2  . Back surgery   . Laparoscopic lysis intestinal adhesions   . Finger surgery     right pinkie finger    Family History  Problem Relation Age of Onset  . Lung cancer Father   . Stomach cancer Father   . Cancer Father   . Diabetes Sister   . Hypertension Sister   . Alcohol abuse Sister   . Diabetes Sister   . Mental illness Sister   . Hypertension Sister   . Alcohol abuse Brother   . Hypertension Brother   . Kidney disease Brother   . Diabetes Brother   . Alcohol abuse Brother   . Hypertension Brother   . Diabetes Brother   . Alcohol abuse Brother   . Hypertension Brother   . Diabetes Brother   . Alcohol abuse Brother    Social History:  reports that she has never smoked. She does not have any smokeless tobacco history on file. She reports that she does not drink alcohol or use illicit drugs.  Allergies:  Allergies  Allergen Reactions  . Penicillins Hives    Medications Prior to Admission  Medication Sig Dispense Refill  . acyclovir (ZOVIRAX) 800 MG tablet Take 800 mg by mouth 3 (three) times daily. Take for breakouts      . albuterol (PROVENTIL HFA;VENTOLIN HFA) 108 (90 BASE) MCG/ACT inhaler Inhale 2 puffs into the lungs every 6 (six) hours as needed for wheezing.  1 Inhaler  3  . albuterol (PROVENTIL) (2.5 MG/3ML) 0.083% nebulizer solution Take 3 mLs  (2.5 mg total) by nebulization every 6 (six) hours as needed. Use one vial per nebulizer every 6 hours as needed for wheezing  75 mL  3  . albuterol (VENTOLIN HFA) 108 (90 BASE) MCG/ACT inhaler Inhale 2 puffs into the lungs daily. 2 puffs two times day         . amLODipine (NORVASC) 10 MG tablet Take 1 tablet (10 mg total) by mouth daily.  90 tablet  3  . Ascorbic Acid (VITAMIN C) 1000 MG tablet Take 1,000 mg by mouth daily.      . baclofen (LIORESAL) 10 MG tablet Take 10 mg by mouth 3 (three) times daily.      . budesonide-formoterol (SYMBICORT) 160-4.5 MCG/ACT inhaler Inhale 2 puffs into the lungs 2 (two) times daily.  1 Inhaler  12  . butalbital-acetaminophen-caffeine (FIORICET) 50-325-40 MG per tablet Take 1-2 tablets by mouth every 6 (six) hours as needed for headache.  20 tablet  3  . cetirizine (ZYRTEC) 10 MG tablet take 1 tablet by mouth once daily  90 tablet  1  . fluticasone (FLONASE) 50 MCG/ACT nasal spray Place 2 sprays into the nose daily.  16 g  2  . ipratropium (ATROVENT) 0.02 % nebulizer solution Take 2.5 mLs (500 mcg total) by nebulization every 6 (six) hours as needed. Use one vial per nebulizer every 6 hours as needed for wheezing  75 mL  3  . montelukast (SINGULAIR) 10 MG tablet Take 1 tablet (10 mg total) by mouth at bedtime.  30 tablet  3  . Multiple Vitamin (MULTIVITAMIN WITH MINERALS) TABS Take 1 tablet by mouth daily.      Marland Kitchen olopatadine (PATANOL) 0.1 % ophthalmic solution Place 1 drop into both eyes daily.      Marland Kitchen oxyCODONE-acetaminophen (PERCOCET) 7.5-325 MG per tablet Take 1 tablet by mouth every 4 (four) hours as needed. For pain      . potassium chloride 20 MEQ/15ML (10%) solution 15ml twice daily  960 mL  4  . Propylene Glycol (SYSTANE BALANCE OP) Place 1 drop into both eyes daily.      Marland Kitchen topiramate (TOPAMAX) 50 MG tablet Take 50 mg by mouth 2 (two) times daily.      Marland Kitchen triamterene-hydrochlorothiazide (MAXZIDE) 75-50 MG per tablet Take 1 tablet by mouth daily.  90 tablet   3  . fish oil-omega-3 fatty acids 1000 MG capsule Take 1 g by mouth daily.        Results for orders placed during the hospital encounter of 07/07/12 (from the past 48 hour(s))  POCT I-STAT 4, (NA,K, GLUC, HGB,HCT)     Status: Abnormal   Collection Time   07/07/12  6:17 AM      Component Value Range Comment   Sodium 140  135 - 145 mEq/L    Potassium 3.9  3.5 - 5.1 mEq/L    Glucose, Bld 110 (*) 70 - 99 mg/dL    HCT 16.1  09.6 - 04.5 %    Hemoglobin 12.9  12.0 - 15.0 g/dL    No results found.  Review of Systems  Constitutional: Negative.   HENT: Positive for neck pain.   Eyes: Negative.   Genitourinary: Negative.   Skin: Negative.   Neurological: Positive for focal weakness. Negative for headaches.  Endo/Heme/Allergies: Negative.   Psychiatric/Behavioral: Negative.     Blood pressure 118/76, pulse 88, temperature 98.8 F (37.1 C), temperature source Oral, resp. rate 20, last menstrual period 06/22/2012, SpO2 100.00%. Physical Exam hent,nl. Neck decrease of flexibility.cv.nl. Lungs, clear. Abdomen, nl. Extremities, nl.NEURO WEAHNESS OF DELTOIDS AND BICEPS. SENSORY CHANGES to c6 dermatome.rraays, ddd at c45 and56  Assessment/Plancdecompression and fusion at c45 56. She and her fiancee are aware of risks and benefits  Jane English M 07/07/2012, 7:57 AM

## 2012-07-07 NOTE — Transfer of Care (Signed)
Immediate Anesthesia Transfer of Care Note  Patient: [name]  Procedure(s) Performed: Procedure(s) (LRB) with comments: ANTERIOR CERVICAL DECOMPRESSION/DISCECTOMY FUSION 2 LEVELS (N/A) - Cervical four-five, five - six  Anterior cervical decompression/diskectomy/fusion/plate  Patient Location: PACU  Anesthesia Type: General  Level of Consciousness: sedated and patient cooperative  Airway & Oxygen Therapy: Patient Spontanous Breathing and Patient connected to face mask oxygen  Post-op Assessment: Report given to PACU RN and Post -op Vital signs reviewed and stable  Post vital signs: Reviewed and stable  Complications: No apparent anesthesia complications

## 2012-07-07 NOTE — Progress Notes (Signed)
Cervical fusion done op note  [phone]

## 2012-07-07 NOTE — Anesthesia Preprocedure Evaluation (Addendum)
Anesthesia Evaluation  Patient identified by MRN, date of birth, ID band Patient awake    Reviewed: Allergy & Precautions, H&P , NPO status , [name] Chart, lab work & pertinent test results  History of Anesthesia Complications Negative for: history of anesthetic complications  Airway Mallampati: II TM Distance: >3 FB Neck ROM: Full    Dental  (+) Teeth Intact and Dental Advisory Given   Pulmonary shortness of breath and with exertion, asthma ,  breath sounds clear to auscultation        Cardiovascular hypertension, Pt. on medications DVT (2010) + Valvular Problems/Murmurs (+ murmur ) Rhythm:Regular Rate:Normal     Neuro/Psych  Headaches, PSYCHIATRIC DISORDERS (Psychotic d/o with hallucinations ) Anxiety Depression  Neuromuscular disease (cervical spine pain)    GI/Hepatic Neg liver ROS, GERD-  ,  Endo/Other  Morbid obesity  Renal/GU negative Renal ROS Bladder dysfunction Female GU complaint     Musculoskeletal  (+) Arthritis -, Osteoarthritis,    Abdominal (+) + obese,   Peds  Hematology   Anesthesia Other Findings   Reproductive/Obstetrics negative OB ROS                       Anesthesia Physical Anesthesia Plan  ASA: III  Anesthesia Plan: General   Post-op Pain Management:    Induction:   Airway Management Planned: Oral ETT  Additional Equipment:   Intra-op Plan:   Post-operative Plan: Extubation in OR  Informed Consent:   Plan Discussed with: Anesthesiologist, CRNA and Surgeon  Anesthesia Plan Comments:         Anesthesia Quick Evaluation

## 2012-07-08 ENCOUNTER — Encounter (HOSPITAL_COMMUNITY): Payer: Self-pay | Admitting: *Deleted

## 2012-07-08 MED ORDER — DOCUSATE SODIUM 100 MG PO CAPS
100.0000 mg | ORAL_CAPSULE | Freq: Two times a day (BID) | ORAL | Status: DC
  Administered 2012-07-08 – 2012-07-10 (×5): 100 mg via ORAL
  Filled 2012-07-08 (×5): qty 1

## 2012-07-08 MED ORDER — OXYCODONE HCL 5 MG PO TABS
15.0000 mg | ORAL_TABLET | ORAL | Status: DC | PRN
  Administered 2012-07-08 – 2012-07-10 (×9): 15 mg via ORAL
  Filled 2012-07-08 (×9): qty 3

## 2012-07-08 NOTE — Evaluation (Signed)
Physical Therapy Evaluation Patient Details Name: AME HEAGLE MRN: [id number] DOB: 1971-01-09 Today's Date: 07/08/2012 Time: 4098-1191 PT Time Calculation (min): 25 min  PT Assessment / Plan / Recommendation Clinical Impression  Ms. Yanil is 41 y/o female s/p ACDF C4-5, C5-6. Presents to physical therapy with decreased mobility following surgery due to pain and fear. Will benefit physical therapy in the acute setting to address these and the below deficits so as to maximize functional mobility and independence for safe d/c home. No PT f/u needed.     PT Assessment  Patient needs continued PT services    Follow Up Recommendations  No PT follow up    Barriers to Discharge        Equipment Recommendations  None recommended by PT    Recommendations for Other Services     Frequency Min 5X/week    Precautions / Restrictions Precautions Precautions: Cervical Required Braces or Orthoses: Cervical Brace Cervical Brace: Soft collar   Pertinent Vitals/Pain C/o 9/10 left shoulder and neck pain      Mobility  Bed Mobility Bed Mobility: Rolling Left;Left Sidelying to Sit Rolling Left: 4: Min assist;With rail Left Sidelying to Sit: 4: Min assist;HOB elevated (30 degrees) Details for Bed Mobility Assistance: tactile and verbal cues for sequencing and follow through Transfers Transfers: Sit to Stand;Stand to Sit Sit to Stand: 1: +2 Total assist Sit to Stand: Patient Percentage: 80% Details for Transfer Assistance: min facilitation bilaterally for power up to stand, assist bilaterally for stabillity Ambulation/Gait Ambulation/Gait Assistance: 4: Min assist;4: Min guard Ambulation Distance (Feet): 400 Feet Assistive device: None;1 person hand held assist Ambulation/Gait Assistance Details: initially pt needing unilateral HHA for stability, after about 20 ft pt able to ambulate holding IV pole and then after another 100 ft pt ambulating without assist but close gaurd for safety Gait  Pattern: Decreased stride length General Gait Details: slow cautious gait with decreased trunk rotation Stairs: No    Exercises     PT Diagnosis: Abnormality of gait;Acute pain  PT Problem List: Decreased activity tolerance;Decreased balance;Decreased knowledge of precautions;Pain;Decreased mobility PT Treatment Interventions: DME instruction;Gait training;Functional mobility training;Stair training;Therapeutic activities;Therapeutic exercise;Balance training;Patient/family education   PT Goals Acute Rehab PT Goals PT Goal Formulation: With patient Time For Goal Achievement: 07/15/12 Potential to Achieve Goals: Good Pt will Roll Supine to Right Side: with modified independence PT Goal: Rolling Supine to Right Side - Progress: Goal set today Pt will Roll Supine to Left Side: with modified independence PT Goal: Rolling Supine to Left Side - Progress: Goal set today Pt will go Supine/Side to Sit: with modified independence PT Goal: Supine/Side to Sit - Progress: Goal set today Pt will go Sit to Supine/Side: with modified independence PT Goal: Sit to Supine/Side - Progress: Goal set today Pt will go Sit to Stand: with modified independence PT Goal: Sit to Stand - Progress: Goal set today Pt will go Stand to Sit: with modified independence PT Goal: Stand to Sit - Progress: Goal set today Pt will Transfer Bed to Chair/Chair to Bed: with modified independence PT Transfer Goal: Bed to Chair/Chair to Bed - Progress: Goal set today Pt will Ambulate: >150 feet;Independently PT Goal: Ambulate - Progress: Goal set today Pt will Go Up / Down Stairs: Flight;with supervision;with rail(s) PT Goal: Up/Down Stairs - Progress: Goal set today  Visit Information  Last PT Received On: 07/08/12 Assistance Needed: +1 PT/OT Co-Evaluation/Treatment: Yes    Subjective Data  Subjective: I knew you had to be with therapy.  Prior Functioning  Home Living Lives With:  (fiancee) Available Help at  Discharge: Family;Available 24 hours/day Type of Home: Apartment Home Access: Stairs to enter [employer] of Steps: 17 Entrance Stairs-Rails: Right;Left;Can reach both Home Layout: One level Bathroom Shower/Tub: Forensic scientist: Standard Bathroom Accessibility: Yes How Accessible: Accessible via walker Home Adaptive Equipment: Straight cane;Walker - rolling;Bedside commode/3-in-1;Tub transfer bench Prior Function Level of Independence: Independent Driving: Yes Dominant Hand: Right    Cognition  Overall Cognitive Status: Appears within functional limits for tasks assessed/performed Arousal/Alertness: Awake/alert Orientation Level: Appears intact for tasks assessed Behavior During Session: [facility] for tasks performed    Extremity/Trunk Assessment Right Lower Extremity Assessment RLE ROM/Strength/Tone: WFL for tasks assessed RLE Sensation: WFL - Light Touch;WFL - Proprioception RLE Coordination: WFL - gross/fine motor Left Lower Extremity Assessment LLE ROM/Strength/Tone: WFL for tasks assessed LLE Sensation: WFL - Light Touch;WFL - Proprioception LLE Coordination: WFL - gross/fine motor   Balance    End of Session PT - End of Session Equipment Utilized During Treatment: Gait belt Activity Tolerance: Patient tolerated treatment well Patient left: in chair Nurse Communication: Mobility status  GP     [facility] HELEN 07/08/2012, 9:28 AM

## 2012-07-08 NOTE — Op Note (Signed)
Jane, English NO.:  [id number]  MEDICAL RECORD NO.:  [id number]  LOCATION:  [id number]                        FACILITY:  [facility]  PHYSICIAN:  Hilda Lias, M.D.   DATE OF BIRTH:  May 27, 1971  DATE OF PROCEDURE:  07/07/2012 DATE OF DISCHARGE:                              OPERATIVE REPORT   PREOPERATIVE DIAGNOSES:  C4-5 and C5-6 degenerative disk disease, spondylosis, herniated disk.  POSTOPERATIVE DIAGNOSES:  C4-5 and C5-6 degenerative disk disease, spondylosis, herniated disk.  PROCEDURE:  Anterior C4-5 and C5-6 diskectomy, decompression of the spinal cord, bilateral foraminotomy, interbody fusion with cages, plate, microscope.  SURGEON:  Hilda Lias, MD  ASSISTANT:  Coletta Memos, MD  CLINICAL HISTORY:  Jane English is a lady who had been complaining of neck pain radiation to both upper extremities associated with pain and weakness.  She had failure of conservative treatment.  X-rays showed degenerative disk disease.  Surgery was advised.  PROCEDURE:  The patient was taken to the OR, and after intubation, the left side of the neck was cleaned with DuraPrep.  Transverse incision was made through the skin, subcutaneous tissue, platysma, straight down to the cervical spine.  X-rays showed that we were at the level of L4-5. From then on, with the help of the microscope, we opened the anterior ligament at C4-5 and C5-6.  The total diskectomy was achieved.  We found quite a bit of degenerative disk disease with foraminal narrowing. Decompression of the spinal cord as well as both C5 nerve root was achieved.  At the level of C5-6, we found not only degenerative disk disease, but also a large herniated disk extending bilaterally left worse than the right one.  Decompression was achieved.  From then on, the endplates were drilled.  Two cages of 7-mm height, lordotic with morselized bone and autograft was inserted followed by a plate using 6 screws.  Lateral  cervical spine showed good position of the plate and screws.  From then on, the area was irrigated.  Hemostasis was accomplished and the wound was closed with Vicryl and Steri-Strips.          ______________________________ Hilda Lias, M.D.     EB/MEDQ  D:  07/07/2012  T:  07/08/2012  Job:  [id number]

## 2012-07-08 NOTE — Progress Notes (Signed)
Patient ID: Jane English, female   DOB: 27-Apr-1971, 41 y.o.   MRN: [id number] Stable. No weakness. Wound dry.

## 2012-07-08 NOTE — Progress Notes (Signed)
Orthopedic Tech Progress Note Patient Details:  Jane English 26-Oct-1971 [id number]  Patient ID: Jane English, female   DOB: 23-Mar-1971, 41 y.o.   MRN: [id number]   Jane English 07/08/2012, 5:46 PM SOFT COLLAR COMPLETED BY BIO TECH

## 2012-07-08 NOTE — Clinical Social Work Note (Signed)
CSW received a consult for SNF. CSW reviewed chart and discussed pt with RN. PT is not recommending any follow up at this time. CSW will update RNCM. CSW is signing off as no further needs identified. Please reconsult if a need arises prior to discharge.   Corretta Munce, MSW, LCSWA #[phone] 

## 2012-07-08 NOTE — Progress Notes (Signed)
Occupational Therapy Evaluation Patient Details Name: Jane English MRN: [id number] DOB: 1971/04/21 Today's Date: 07/08/2012 Time: 4098-1191 OT Time Calculation (min): 29 min  OT Assessment / Plan / Recommendation Clinical Impression  Pt s/p ACDF C4-5, C5-6 thus affecting PLOF. Will benefit from acute OT services to address below problem list in prep for d/c home.    OT Assessment  Patient needs continued OT Services    Follow Up Recommendations  Supervision/Assistance - 24 hour;No OT follow up    Barriers to Discharge      Equipment Recommendations  None recommended by PT;None recommended by OT    Recommendations for Other Services    Frequency  Min 2X/week    Precautions / Restrictions Precautions Precautions: Cervical Required Braces or Orthoses: Cervical Brace Cervical Brace: Soft collar   Pertinent Vitals/Pain See vitals    ADL  Grooming: Performed;Wash/dry hands;Supervision/safety Where Assessed - Grooming: Supported standing Lower Body Dressing: Performed;Supervision/safety Where Assessed - Lower Body Dressing: Unsupported sitting Toilet Transfer: Performed;+2 Total assistance Toilet Transfer: Patient Percentage: 80% Toilet Transfer Method: Sit to Barista: Comfort height toilet;Grab bars Toileting - Architect and Hygiene: Performed;Supervision/safety Where Assessed - Engineer, mining and Hygiene: Sit to stand from 3-in-1 or toilet Equipment Used: Gait belt Transfers/Ambulation Related to ADLs: Min assist initially with HHA.  Towards end of session, pt min guard with occasional use of IV pole for support. Requires increased time. ADL Comments: Pt donned bil. socks with ankles crossed over knees.  Frequently holds LUE in comfort position and reports "it feels heavy" when performing functional mobility.  Pt able to use L UE to don socks but with some difficulty.  Reports she sometimes drops items when using left hand.      OT Diagnosis: Generalized weakness;Acute pain  OT Problem List: Decreased activity tolerance;Decreased strength;Impaired balance (sitting and/or standing);Decreased knowledge of use of DME or AE;Decreased knowledge of precautions;Pain;Impaired UE functional use OT Treatment Interventions: Self-care/ADL training;DME and/or AE instruction;Therapeutic activities;Patient/family education;Balance training   OT Goals Acute Rehab OT Goals OT Goal Formulation: With patient Time For Goal Achievement: 07/15/12 Potential to Achieve Goals: Good ADL Goals Pt Will Perform Grooming: with modified independence;Standing at sink ADL Goal: Grooming - Progress: Goal set today Pt Will Transfer to Toilet: with modified independence;Ambulation;Comfort height toilet ADL Goal: Toilet Transfer - Progress: Goal set today Pt Will Perform Toileting - Clothing Manipulation: Independently;Standing;Sitting on 3-in-1 or toilet ADL Goal: Toileting - Clothing Manipulation - Progress: Goal set today Pt Will Perform Toileting - Hygiene: Independently;Sit to stand from 3-in-1/toilet;Standing at 3-in-1/toilet ADL Goal: Toileting - Hygiene - Progress: Goal set today Arm Goals Additional Arm Goal #1: Pt will incorporate use of LUE during 75% of ADL tasks. Arm Goal: Additional Goal #1 - Progress: Goal set today Miscellaneous OT Goals Miscellaneous OT Goal #1: Pt will independently generalize cervical precautions during all ADLs. OT Goal: Miscellaneous Goal #1 - Progress: Goal set today  Visit Information  Last OT Received On: 07/08/12 Assistance Needed: +1 PT/OT Co-Evaluation/Treatment: Yes    Subjective Data      Prior Functioning  Vision/Perception  Home Living Lives With:  (fiance) Available Help at Discharge: Family;Available 24 hours/day Type of Home: Apartment Home Access: Stairs to enter [employer] of Steps: 17 Entrance Stairs-Rails: Right;Left;Can reach both Home Layout: One level Bathroom  Shower/Tub: Forensic scientist: Standard Bathroom Accessibility: Yes How Accessible: Accessible via walker Home Adaptive Equipment: Straight cane;Walker - rolling;Bedside commode/3-in-1;Tub transfer bench Prior Function Level of Independence:  Independent Driving: Yes Dominant Hand: Right      Cognition  Overall Cognitive Status: Appears within functional limits for tasks assessed/performed Arousal/Alertness: Awake/alert Orientation Level: Appears intact for tasks assessed Behavior During Session: [facility] for tasks performed    Extremity/Trunk Assessment Right Upper Extremity Assessment RUE ROM/Strength/Tone: Within functional levels Left Upper Extremity Assessment LUE ROM/Strength/Tone: Deficits;Unable to fully assess;Due to pain LUE ROM/Strength/Tone Deficits: C/o LUE pain, primarily in shoulder.  LUE Sensation: Deficits LUE Sensation Deficits: Reports some tingling, but mostly pain. LUE Coordination: Deficits LUE Coordination Deficits: gross motor requires increased time.  FMC within functional limits but slightly increased time to perform movement. Right Lower Extremity Assessment RLE ROM/Strength/Tone: WFL for tasks assessed RLE Sensation: WFL - Light Touch;WFL - Proprioception RLE Coordination: WFL - gross/fine motor Left Lower Extremity Assessment LLE ROM/Strength/Tone: WFL for tasks assessed LLE Sensation: WFL - Light Touch;WFL - Proprioception LLE Coordination: WFL - gross/fine motor   Mobility  Shoulder Instructions  Bed Mobility Bed Mobility: Rolling Left;Left Sidelying to Sit Rolling Left: 4: Min assist;With rail Left Sidelying to Sit: 4: Min assist;HOB elevated (30 degrees) Details for Bed Mobility Assistance: tactile and verbal cues for sequencing and follow through Transfers Transfers: Sit to Stand;Stand to Sit Sit to Stand: 1: +2 Total assist;From bed;From toilet Sit to Stand: Patient Percentage: 80% Stand to Sit: 4: Min assist;To  chair/3-in-1;With upper extremity assist Details for Transfer Assistance: min facilitation bilaterally for power up to stand, assist bilaterally for stabillity       Exercise     Balance     End of Session OT - End of Session Equipment Utilized During Treatment: Cervical collar;Gait belt Activity Tolerance: Patient tolerated treatment well Patient left: in chair;with call bell/phone within reach;with family/visitor present Nurse Communication: Mobility status  GO    07/08/2012 Cipriano Mile OTR/L Pager [phone] Office [phone]  Cipriano Mile 07/08/2012, 9:40 AM

## 2012-07-09 MED ORDER — POLYETHYLENE GLYCOL 3350 17 G PO PACK
17.0000 g | PACK | Freq: Every day | ORAL | Status: DC
  Administered 2012-07-09: 17 g via ORAL
  Filled 2012-07-09 (×2): qty 1

## 2012-07-09 MED ORDER — ALBUTEROL SULFATE (5 MG/ML) 0.5% IN NEBU: 2.5000 mg | INHALATION_SOLUTION | RESPIRATORY_TRACT | Status: DC | PRN

## 2012-07-09 NOTE — Progress Notes (Signed)
Occupational Therapy Treatment Patient Details Name: Jane English MRN: [id number] DOB: 1971/04/20 Today's Date: 07/09/2012 Time: 4098-1191 OT Time Calculation (min): 13 min  OT Assessment / Plan / Recommendation Comments on Treatment Session Pt has progressed well and has met goals.    Follow Up Recommendations  Supervision/Assistance - 24 hour;No OT follow up    Barriers to Discharge       Equipment Recommendations  None recommended by OT    Recommendations for Other Services    Frequency Min 2X/week   Plan Discharge plan remains appropriate;All goals met and education completed, patient discharged from OT services    Precautions / Restrictions Precautions Precautions: Cervical Precaution Comments: Pt independently verbalized and maintained cervical precautions throughout. Required Braces or Orthoses: Cervical Brace Cervical Brace: Soft collar   Pertinent Vitals/Pain See vitals    ADL  Toilet Transfer: Simulated;Modified independent Toilet Transfer Method: Sit to Barista:  (chair) Toileting - Architect and Hygiene: Simulated;Independent Where Assessed - Toileting Clothing Manipulation and Hygiene: Standing Transfers/Ambulation Related to ADLs: mod I ADL Comments: Pt reports that her LUE still "feels a little funny" but she has been trying to use it. Pt verbalized ADL techniques for cervical precautions (sitting in shower and crossing ankles over knees). Prior to OT arrival, pt reports she took a full shower and performed grooming tasks at the sink.  Pt reports her fiance was present but didn't need to help.  Pt cautious with movements when standing to pull up pants but no LOB.    OT Diagnosis:    OT Problem List:   OT Treatment Interventions:     OT Goals ADL Goals Pt Will Perform Grooming: with modified independence;Standing at sink ADL Goal: Grooming - Progress: Met Pt Will Transfer to Toilet: with modified  independence;Ambulation;Comfort height toilet ADL Goal: Toilet Transfer - Progress: Met Pt Will Perform Toileting - Clothing Manipulation: Independently;Standing;Sitting on 3-in-1 or toilet ADL Goal: Toileting - Clothing Manipulation - Progress: Met Pt Will Perform Toileting - Hygiene: Independently;Sit to stand from 3-in-1/toilet;Standing at 3-in-1/toilet ADL Goal: Toileting - Hygiene - Progress: Met Arm Goals Additional Arm Goal #1: Pt will incorporate use of LUE during 75% of ADL tasks. Arm Goal: Additional Goal #1 - Progress: Met Miscellaneous OT Goals Miscellaneous OT Goal #1: Pt will independently generalize cervical precautions during all ADLs. OT Goal: Miscellaneous Goal #1 - Progress: Met  Visit Information  Last OT Received On: 07/09/12 Assistance Needed: +1    Subjective Data      Prior Functioning       Cognition  Overall Cognitive Status: Appears within functional limits for tasks assessed/performed Arousal/Alertness: Awake/alert Orientation Level: Appears intact for tasks assessed Behavior During Session: [facility] for tasks performed    Mobility  Shoulder Instructions Bed Mobility Rolling Left: 6: Modified independent (Device/Increase time) Left Sidelying to Sit: HOB elevated;6: Modified independent (Device/Increase time) (45 degrees) Transfers Sit to Stand: From chair/3-in-1;5: Supervision Stand to Sit: To chair/3-in-1;5: Supervision Details for Transfer Assistance: pt stood with increased time, supervision for safety       Exercises      Balance Balance Balance Assessed: Yes Dynamic Standing Balance Dynamic Standing - Balance Support: Left upper extremity supported Dynamic Standing - Comments: observed pt standing x5-6 minutes at the sink for ADLs with good balance supporting herself with LUE as necessary, no LOB   End of Session OT - End of Session Equipment Utilized During Treatment: Cervical collar Activity Tolerance: Patient tolerated treatment  well Patient left:  in chair;with call bell/phone within reach;with family/visitor present Nurse Communication: Mobility status  GO    07/09/2012 Cipriano Mile OTR/L Pager [phone] Office [phone]  Cipriano Mile 07/09/2012, 12:27 PM

## 2012-07-09 NOTE — Progress Notes (Signed)
Physical Therapy Treatment Patient Details Name: Jane English MRN: [id number] DOB: 26-Jan-1971 Today's Date: 07/09/2012 Time: 7829-5621 PT Time Calculation (min): 23 min  PT Assessment / Plan / Recommendation Comments on Treatment Session  Jane English is doing great. Has reached supervision-modified independent level with all mobility. Fiance very supportive and attentive. Both pt and fiance report confidence with going home, no further PT needs at this time.     Follow Up Recommendations  No PT follow up    Barriers to Discharge        Equipment Recommendations  None recommended by PT    Recommendations for Other Services    Frequency     Plan Discharge plan remains appropriate;All goals met and education completed, patient dischaged from PT services    Precautions / Restrictions Precautions Precautions: Cervical Precaution Comments: handout provided Cervical Brace: Soft collar       Mobility  Bed Mobility Rolling Left: 6: Modified independent (Device/Increase time) Left Sidelying to Sit: HOB elevated;6: Modified independent (Device/Increase time) (45 degrees) Transfers Transfers: Sit to Stand;Stand to Sit Sit to Stand: 6: Modified independent (Device/Increase time) Stand to Sit: Not tested (comment) Details for Transfer Assistance: pt stood with increased time, supervision for safety Ambulation/Gait Ambulation/Gait Assistance: 5: Supervision;6: Modified independent (Device/Increase time) Ambulation Distance (Feet): 150 Feet Assistive device: None Ambulation/Gait Assistance Details: ambulating in the hallway with her fiance, fiance providing appropriate supervision, pt still slow and cautious but no overt concerns; progressed to modified indepdent level Gait Pattern: Decreased stride length Stairs: Yes Stairs Assistance: 5: Supervision Stairs Assistance Details (indicate cue type and reason): supervision provided by fiance to ascend stairs, pt slow and cautious with step  by step pattern and rail on her right; to descend pt resting RUE on fiance's shoulder for increased support and LUE on railing, slow cautious descent but good safety observed from both patient and fiance Stair Management Technique: One rail Right;One rail Left Number of Stairs: 12       PT Goals Acute Rehab PT Goals PT Goal: Rolling Supine to Right Side - Progress:  (not addressed) PT Goal: Rolling Supine to Left Side - Progress: Met PT Goal: Supine/Side to Sit - Progress: Met PT Goal: Sit to Supine/Side - Progress:  (not addressed today) PT Goal: Sit to Stand - Progress: Met PT Goal: Stand to Sit - Progress:  (no assessed today) PT Transfer Goal: Bed to Chair/Chair to Bed - Progress: Met PT Goal: Ambulate - Progress: Met PT Goal: Up/Down Stairs - Progress: Met  Visit Information  Last PT Received On: 07/09/12 Assistance Needed: +1    Subjective Data  Subjective: Im still hurting but my fiance and I have done a lot of walking and I feel like Im doing well.  Patient Stated Goal: home   Cognition  Overall Cognitive Status: Appears within functional limits for tasks assessed/performed Arousal/Alertness: Awake/alert Orientation Level: Appears intact for tasks assessed Behavior During Session: [facility] for tasks performed    Balance  Balance Balance Assessed: Yes Dynamic Standing Balance Dynamic Standing - Balance Support: Left upper extremity supported Dynamic Standing - Comments: observed pt standing x5-6 minutes at the sink for ADLs with good balance supporting herself with LUE as necessary, no LOB  End of Session PT - End of Session Equipment Utilized During Treatment: Gait belt Activity Tolerance: Patient tolerated treatment well Patient left: Other (comment) (ambulating in the hallway with fiance) Nurse Communication: Mobility status   GP     [facility] HELEN 07/09/2012, 9:14 AM

## 2012-07-09 NOTE — Progress Notes (Signed)
Patient ID: Jane English, female   DOB: November 23, 1970, 41 y.o.   MRN: [id number] Still having pain between shoulders, no weakness. Wound dry

## 2012-07-10 NOTE — Care Management Note (Signed)
    Page 1 of 1   07/10/2012     1:15:12 PM   CARE MANAGEMENT NOTE 07/10/2012  Patient:  Jane English, Jane English   Account Number:  [id number]  Date Initiated:  07/07/2012  Documentation initiated by:  [facility]  Subjective/Objective Assessment:   Admitted postop anterior cervical decompression  and fusion C4-5, 5-6.     Action/Plan:   PT/OT evals -no d/c needs   Anticipated DC Date:  06/09/2012   Anticipated DC Plan:  HOME/SELF CARE      DC Planning Services  CM consult      Choice offered to / List presented to:             Status of service:  In process, will continue to follow Medicare Important Message given?  YES (If response is "NO", the following Medicare IM given date fields will be blank) Date Medicare IM given:  07/10/2012 Date Additional Medicare IM given:    Discharge Disposition:  HOME/SELF CARE  Per UR Regulation:  Reviewed for med. necessity/level of care/duration of stay  If discussed at Long Length of Stay Meetings, dates discussed:    Comments:

## 2012-07-10 NOTE — Discharge Summary (Signed)
Physician Discharge Summary  Patient ID: Jane English MRN: [id number] DOB/AGE: [birth year] 43 y.o.  Admit date: 07/07/2012 Discharge date: 07/10/2012  Admission Diagnoses:cervical stenosis   Discharge Diagnoses: same   Discharged Condition: no pain  Hospital Course: surgery  Consults: no  Significant Diagnostic Studies: mri  Treatmentcervical fusion  Discharge Exam: Blood pressure 133/73, pulse 77, temperature 98.2 F (36.8 C), temperature source Oral, resp. rate 18, height 5\' 6"  (1.676 m), weight 81.6 kg (179 lb 14.3 oz), last menstrual period 06/22/2012, SpO2 96.00%. No pai,no weakness  Disposition: home on roxicodone,diazepam and strerapred  Discharge Orders    Future Appointments: Provider: Department: Dept Phone: Center:   10/06/2012 10:00 AM Kerri Perches, MD [facility] [phone] [facility]     Medication List  As of 07/10/2012  9:59 AM   ASK your doctor about these medications         acyclovir 800 MG tablet   Commonly known as: ZOVIRAX   Take 800 mg by mouth 3 (three) times daily. Take for breakouts      amLODipine 10 MG tablet   Commonly known as: NORVASC   Take 1 tablet (10 mg total) by mouth daily.      baclofen 10 MG tablet   Commonly known as: LIORESAL   Take 10 mg by mouth 3 (three) times daily.      budesonide-formoterol 160-4.5 MCG/ACT inhaler   Commonly known as: SYMBICORT   Inhale 2 puffs into the lungs 2 (two) times daily.      butalbital-acetaminophen-caffeine 50-325-40 MG per tablet   Commonly known as: FIORICET, ESGIC   Take 1-2 tablets by mouth every 6 (six) hours as needed for headache.      cetirizine 10 MG tablet   Commonly known as: ZYRTEC   take 1 tablet by mouth once daily      fish oil-omega-3 fatty acids 1000 MG capsule   Take 1 g by mouth daily.      fluticasone 50 MCG/ACT nasal spray   Commonly known as: FLONASE   Place 2 sprays into the nose daily.      ipratropium 0.02 % nebulizer solution   Commonly known  as: ATROVENT   Take 2.5 mLs (500 mcg total) by nebulization every 6 (six) hours as needed. Use one vial per nebulizer every 6 hours as needed for wheezing      montelukast 10 MG tablet   Commonly known as: SINGULAIR   Take 1 tablet (10 mg total) by mouth at bedtime.      multivitamin with minerals Tabs   Take 1 tablet by mouth daily.      olopatadine 0.1 % ophthalmic solution   Commonly known as: PATANOL   Place 1 drop into both eyes daily.      oxyCODONE-acetaminophen 7.5-325 MG per tablet   Commonly known as: PERCOCET   Take 1 tablet by mouth every 4 (four) hours as needed. For pain      potassium chloride 20 MEQ/15ML (10%) solution   15ml twice daily      SYSTANE BALANCE OP   Place 1 drop into both eyes daily.      topiramate 50 MG tablet   Commonly known as: TOPAMAX   Take 50 mg by mouth 2 (two) times daily.      triamterene-hydrochlorothiazide 75-50 MG per tablet   Commonly known as: MAXZIDE   Take 1 tablet by mouth daily.      VENTOLIN HFA 108 (90 BASE) MCG/ACT inhaler  Generic drug: albuterol   Inhale 2 puffs into the lungs daily. 2 puffs two times day         albuterol 108 (90 BASE) MCG/ACT inhaler   Commonly known as: PROVENTIL HFA;VENTOLIN HFA   Inhale 2 puffs into the lungs every 6 (six) hours as needed for wheezing.      albuterol (2.5 MG/3ML) 0.083% nebulizer solution   Commonly known as: PROVENTIL   Take 3 mLs (2.5 mg total) by nebulization every 6 (six) hours as needed. Use one vial per nebulizer every 6 hours as needed for wheezing      vitamin C 1000 MG tablet   Take 1,000 mg by mouth daily.             Signed: Karn Cassis 07/10/2012, 9:59 AM

## 2012-07-19 ENCOUNTER — Encounter (HOSPITAL_COMMUNITY): Payer: Self-pay | Admitting: *Deleted

## 2012-07-19 ENCOUNTER — Emergency Department (HOSPITAL_COMMUNITY)
Admission: EM | Admit: 2012-07-19 | Discharge: 2012-07-19 | Disposition: A | Discharge status: Home / Self Care | Payer: Medicare Other | Attending: Emergency Medicine | Admitting: Emergency Medicine

## 2012-07-19 ENCOUNTER — Emergency Department (HOSPITAL_COMMUNITY): Payer: Medicare Other

## 2012-07-19 DIAGNOSIS — Z6379 Other stressful life events affecting family and household: Secondary | ICD-10-CM | POA: Diagnosis not present

## 2012-07-19 DIAGNOSIS — E669 Obesity, unspecified: Secondary | ICD-10-CM | POA: Diagnosis not present

## 2012-07-19 DIAGNOSIS — J45909 Unspecified asthma, uncomplicated: Secondary | ICD-10-CM | POA: Diagnosis not present

## 2012-07-19 DIAGNOSIS — Z888 Allergy status to other drugs, medicaments and biological substances status: Secondary | ICD-10-CM | POA: Insufficient documentation

## 2012-07-19 DIAGNOSIS — Z800 Family history of malignant neoplasm of digestive organs: Secondary | ICD-10-CM | POA: Insufficient documentation

## 2012-07-19 DIAGNOSIS — M542 Cervicalgia: Secondary | ICD-10-CM | POA: Diagnosis not present

## 2012-07-19 DIAGNOSIS — Z818 Family history of other mental and behavioral disorders: Secondary | ICD-10-CM | POA: Diagnosis not present

## 2012-07-19 DIAGNOSIS — Z885 Allergy status to narcotic agent status: Secondary | ICD-10-CM | POA: Diagnosis not present

## 2012-07-19 DIAGNOSIS — I10 Essential (primary) hypertension: Secondary | ICD-10-CM | POA: Insufficient documentation

## 2012-07-19 DIAGNOSIS — G8918 Other acute postprocedural pain: Secondary | ICD-10-CM | POA: Insufficient documentation

## 2012-07-19 DIAGNOSIS — F3289 Other specified depressive episodes: Secondary | ICD-10-CM | POA: Insufficient documentation

## 2012-07-19 DIAGNOSIS — Z8249 Family history of ischemic heart disease and other diseases of the circulatory system: Secondary | ICD-10-CM | POA: Insufficient documentation

## 2012-07-19 DIAGNOSIS — F329 Major depressive disorder, single episode, unspecified: Secondary | ICD-10-CM | POA: Diagnosis not present

## 2012-07-19 DIAGNOSIS — Z880 Allergy status to penicillin: Secondary | ICD-10-CM | POA: Diagnosis not present

## 2012-07-19 DIAGNOSIS — Z809 Family history of malignant neoplasm, unspecified: Secondary | ICD-10-CM | POA: Diagnosis not present

## 2012-07-19 DIAGNOSIS — Z833 Family history of diabetes mellitus: Secondary | ICD-10-CM | POA: Insufficient documentation

## 2012-07-19 DIAGNOSIS — Z841 Family history of disorders of kidney and ureter: Secondary | ICD-10-CM | POA: Diagnosis not present

## 2012-07-19 MED ORDER — ONDANSETRON HCL 4 MG/2ML IJ SOLN
4.0000 mg | Freq: Once | INTRAMUSCULAR | Status: AC
  Administered 2012-07-19: 4 mg via INTRAVENOUS
  Filled 2012-07-19: qty 2

## 2012-07-19 MED ORDER — METHOCARBAMOL 100 MG/ML IJ SOLN
1000.0000 mg | Freq: Once | INTRAVENOUS | Status: AC
  Administered 2012-07-19: 1000 mg via INTRAVENOUS
  Filled 2012-07-19: qty 10

## 2012-07-19 MED ORDER — MORPHINE SULFATE 4 MG/ML IJ SOLN
4.0000 mg | Freq: Once | INTRAMUSCULAR | Status: AC
  Administered 2012-07-19: 4 mg via INTRAVENOUS
  Filled 2012-07-19: qty 1

## 2012-07-19 MED ORDER — OXYCODONE HCL 5 MG PO TABS
15.0000 mg | ORAL_TABLET | ORAL | Status: AC | PRN
Start: 2012-07-19 — End: 2012-07-29

## 2012-07-19 MED ORDER — METHOCARBAMOL 100 MG/ML IJ SOLN: 1000.0000 mg | Freq: Once | INTRAMUSCULAR | Status: DC

## 2012-07-19 MED ORDER — HYDROMORPHONE HCL PF 1 MG/ML IJ SOLN
1.0000 mg | Freq: Once | INTRAMUSCULAR | Status: AC
  Administered 2012-07-19: 1 mg via INTRAVENOUS
  Filled 2012-07-19: qty 1

## 2012-07-19 NOTE — ED Provider Notes (Signed)
History     CSN: [id number]  Arrival date & time 07/19/12  1520   First MD Initiated Contact with Patient 07/19/12 1950      Chief Complaint  Patient presents with  . Follow-up  . Neck Pain    (Consider location/radiation/quality/duration/timing/severity/associated sxs/prior treatment) HPI Pt had cervical diskectomy on 9/3 and d/c home on Friday. States that before surgery she had LUE weakness and numbness and that is unchanged and poorly managed with PO pain meds. She also c/o pain at surgical site but denies mass, bleeding, fever or chills. Pt discussed with Dr Yetta Barre who advised to come to the ED for evaluation. No new neurologic findings Past Medical History  Diagnosis Date  . Migraine headache   . Back pain   . Obesity   . Hypertension   . DVT (deep venous thrombosis) 2010  . Heart murmur     no cardiologist  . Asthma   . Bronchitis   . Shortness of breath     if having asthma attack  . Depression     Past Surgical History  Procedure Date  . Appendectomy   . Carpal tunnel release   . Tubal ligation   . Lumbar spine surgery 2010    x2  . Back surgery   . Laparoscopic lysis intestinal adhesions   . Finger surgery     right pinkie finger  . Anterior cervical decomp/discectomy fusion 07/07/2012    Procedure: ANTERIOR CERVICAL DECOMPRESSION/DISCECTOMY FUSION 2 LEVELS;  Surgeon: Karn Cassis, MD;  Location: [facility];  Service: Neurosurgery;  Laterality: N/A;  Cervical four-five, five - six  Anterior cervical decompression/diskectomy/fusion/plate    Family History  Problem Relation Age of Onset  . Lung cancer Father   . Stomach cancer Father   . Cancer Father   . Diabetes Sister   . Hypertension Sister   . Alcohol abuse Sister   . Diabetes Sister   . Mental illness Sister   . Hypertension Sister   . Alcohol abuse Brother   . Hypertension Brother   . Kidney disease Brother   . Diabetes Brother   . Alcohol abuse Brother   . Hypertension Brother   .  Diabetes Brother   . Alcohol abuse Brother   . Hypertension Brother   . Diabetes Brother   . Alcohol abuse Brother     History  Substance Use Topics  . Smoking status: Never Smoker   . Smokeless tobacco: Not on file  . Alcohol Use: No    OB History    Grav Para Term Preterm Abortions TAB SAB Ect Mult Living                  Review of Systems  Constitutional: Negative for fever and chills.  HENT: Positive for sore throat and neck pain. Negative for neck stiffness.   Respiratory: Negative for shortness of breath.   Cardiovascular: Negative for chest pain.  Gastrointestinal: Negative for nausea, vomiting and abdominal pain.  Skin: Negative for rash and wound.  Neurological: Positive for weakness and numbness. Negative for dizziness, light-headedness and headaches.    Allergies  Ibuprofen; Penicillins; and Vicodin  Home Medications   Current Outpatient Rx  Name Route Sig Dispense Refill  . ACYCLOVIR 800 MG PO TABS Oral Take 800 mg by mouth 3 (three) times daily. Take for breakouts    . ALBUTEROL SULFATE HFA 108 (90 BASE) MCG/ACT IN AERS Inhalation Inhale 2 puffs into the lungs every 6 (six) hours as  needed for wheezing. 1 Inhaler 3  . ALBUTEROL SULFATE (2.5 MG/3ML) 0.083% IN NEBU Nebulization Take 3 mLs (2.5 mg total) by nebulization every 6 (six) hours as needed. Use one vial per nebulizer every 6 hours as needed for wheezing 75 mL 3  . AMLODIPINE BESYLATE 10 MG PO TABS Oral Take 1 tablet (10 mg total) by mouth daily. 90 tablet 3    Please dispense 90 day supply  . VITAMIN C 1000 MG PO TABS Oral Take 1,000 mg by mouth daily.    Marland Kitchen BACLOFEN 10 MG PO TABS Oral Take 10 mg by mouth 3 (three) times daily.    . BUDESONIDE-FORMOTEROL FUMARATE 160-4.5 MCG/ACT IN AERO Inhalation Inhale 2 puffs into the lungs 2 (two) times daily. 1 Inhaler 12    Dose increase effective 02/05/2012  . BUTALBITAL-APAP-CAFFEINE 50-325-40 MG PO TABS Oral Take 1-2 tablets by mouth every 6 (six) hours as  needed for headache. 20 tablet 3  . CETIRIZINE HCL 10 MG PO TABS  take 1 tablet by mouth once daily 90 tablet 1  . OMEGA-3 FATTY ACIDS 1000 MG PO CAPS Oral Take 1 g by mouth daily.    Marland Kitchen FLUTICASONE PROPIONATE 50 MCG/ACT NA SUSP Nasal Place 2 sprays into the nose daily. 16 g 2  . IPRATROPIUM BROMIDE 0.02 % IN SOLN Nebulization Take 2.5 mLs (500 mcg total) by nebulization every 6 (six) hours as needed. Use one vial per nebulizer every 6 hours as needed for wheezing 75 mL 3  . MONTELUKAST SODIUM 10 MG PO TABS Oral Take 1 tablet (10 mg total) by mouth at bedtime. 30 tablet 3  . ADULT MULTIVITAMIN W/MINERALS CH Oral Take 1 tablet by mouth daily.    . OLOPATADINE HCL 0.1 % OP SOLN Both Eyes Place 1 drop into both eyes daily.    . OXYCODONE HCL 15 MG PO TABS Oral Take 15 mg by mouth every 4 (four) hours as needed. For breakthrough pain    . OXYCODONE-ACETAMINOPHEN 7.5-325 MG PO TABS Oral Take 1 tablet by mouth every 4 (four) hours as needed. For pain    . POTASSIUM CHLORIDE 20 MEQ/15ML (10%) PO LIQD  15ml twice daily 960 mL 4  . PRESCRIPTION MEDICATION Oral Take 1 tablet by mouth 3 (three) times daily as needed. Muscle relaxer    . PRESCRIPTION MEDICATION Oral Take 1 tablet by mouth as directed. Take 2 in the am, 1 for lunch, 2 in the pm. Steroid used for neck pain    . SYSTANE BALANCE OP Both Eyes Place 1 drop into both eyes daily.    . TOPIRAMATE 50 MG PO TABS Oral Take 50 mg by mouth 2 (two) times daily.    . TRIAMTERENE-HCTZ 75-50 MG PO TABS Oral Take 1 tablet by mouth daily. 90 tablet 3    Dose increase effective 04/27/2012    BP 125/67  Pulse 88  Temp 98.6 F (37 C) (Oral)  Resp 18  SpO2 98%  LMP 06/13/2012  Physical Exam  Nursing note and vitals reviewed. Constitutional: She is oriented to person, place, and time. She appears well-developed and well-nourished. No distress.       Pt wearing soft collar  HENT:  Head: Normocephalic and atraumatic.  Mouth/Throat: Oropharynx is clear and  moist. No oropharyngeal exudate.  Eyes: EOM are normal. Pupils are equal, round, and reactive to light.  Neck:       Surgical wound on L anterior neck with dry dressing. No evidence of infection. No  masses. Diffuse TTP entire posterior cervical spine.   Cardiovascular: Normal rate and regular rhythm.   Pulmonary/Chest: Effort normal and breath sounds normal. No respiratory distress. She has no wheezes. She has no rales.  Abdominal: Soft. Bowel sounds are normal. There is no tenderness. There is no rebound and no guarding.  Musculoskeletal: Normal range of motion. She exhibits no edema and no tenderness.  Neurological: She is alert and oriented to person, place, and time.       4/5 grip strength in LUE, mildly decreased sensation entire LUE. 2+ radial pulses. 5/5 motor in all other ext  Skin: Skin is warm and dry. No rash noted. No erythema.  Psychiatric: She has a normal mood and affect. Her behavior is normal.    ED Course  Procedures (including critical care time)  Labs Reviewed - No data to display Dg Cervical Spine 2-3 Views  07/19/2012  *RADIOLOGY REPORT*  Clinical Data: Posterior neck pain since surgery 07/07/2012.  CERVICAL SPINE - 2-3 VIEW  Comparison: Post-myelogram CT scan 05/29/2012. Intraoperative views of the cervical spine 07/07/2012.  Findings: The patient is status post C4-6 ACDF.  Vertebral body height and alignment are maintained.  Hardware is intact. Prevertebral soft tissues appear prominent but no prevertebral soft tissue gas collection is identified.  Soft tissue prominence may be due to phase of respiration.  Lung apices are clear.  IMPRESSION:  1.  Status post C4-6 ACDF.  Hardware is intact and there is no fracture. 2.  Prominence of the prevertebral soft tissues may be due to phase of respiration but cannot be definitively characterized.   Original Report Authenticated By: Bernadene Bell. D'ALESSIO, M.D.      1. Post-operative pain       MDM  Will treat  symptomatically. Cervical spine films and discuss with Dr Yetta Barre.   Pt symptoms have improved. Spoke with Dr Lovell Sheehan who suggested D/C and f/u with Dr Jeral Fruit tomorrow. Return for concerns.       Loren Racer, MD 07/19/12 [phone]

## 2012-07-19 NOTE — ED Notes (Signed)
Third back surgery; having post. Neck pain and lt. Arm pain; feel like the hardware is grinding; stiff when sleeping.

## 2012-07-20 ENCOUNTER — Telehealth: Payer: Self-pay | Admitting: Family Medicine

## 2012-07-20 DIAGNOSIS — J45909 Unspecified asthma, uncomplicated: Secondary | ICD-10-CM

## 2012-07-20 MED ORDER — MONTELUKAST SODIUM 10 MG PO TABS: 10.0000 mg | ORAL_TABLET | Freq: Every day | ORAL | Status: DC

## 2012-07-20 NOTE — Telephone Encounter (Signed)
WANTED REFILL OF SINGULAIR. SENT IN

## 2012-07-21 NOTE — Telephone Encounter (Signed)
Called patient back to find out what she wanted. No answer. Left message

## 2012-07-22 NOTE — Telephone Encounter (Signed)
States she is needing something for pain. The vicodin was D/C'd by you from the pharmacy. She said that she didn't take that unless she had to because it made her itch all over. Said she doesn't like the way tramadol makes her feel. Is there anything else that can be sent in?

## 2012-07-22 NOTE — Telephone Encounter (Signed)
Check with pt and pharmacy pls, she just had surgery, I believe that the surgeon needs to provide meds for [pain at this time. I will look at d/c summary and also let you know

## 2012-07-23 NOTE — Telephone Encounter (Signed)
Patient aware.

## 2012-07-29 DIAGNOSIS — M542 Cervicalgia: Secondary | ICD-10-CM | POA: Diagnosis not present

## 2012-07-30 DIAGNOSIS — F4542 Pain disorder with related psychological factors: Secondary | ICD-10-CM | POA: Diagnosis not present

## 2012-07-30 DIAGNOSIS — F4323 Adjustment disorder with mixed anxiety and depressed mood: Secondary | ICD-10-CM | POA: Diagnosis not present

## 2012-07-31 ENCOUNTER — Telehealth: Payer: Self-pay | Admitting: Family Medicine

## 2012-07-31 NOTE — Telephone Encounter (Signed)
Had question about gabapentin 300mg  started by Dr Jeral Fruit gabapentin 300mg  three times daily, worried about blood pressure and seizures, I educated her this is not a problem  Advised she start with 1 daily and titrate every 3 to 5 days to prescribed dose, as tolerated

## 2012-08-11 ENCOUNTER — Telehealth: Payer: Self-pay | Admitting: Family Medicine

## 2012-08-11 NOTE — Telephone Encounter (Signed)
Patient not feeling good lately. In one of her depressed moods.Offered appt with Dr Jeanice Lim but she said that she didn't know anything about her and she would rather wait and see Dr Algie Coffer. Has appt for 10-29

## 2012-08-11 NOTE — Telephone Encounter (Signed)
pls document what she needs

## 2012-08-13 DIAGNOSIS — F4323 Adjustment disorder with mixed anxiety and depressed mood: Secondary | ICD-10-CM | POA: Diagnosis not present

## 2012-08-13 DIAGNOSIS — F4542 Pain disorder with related psychological factors: Secondary | ICD-10-CM | POA: Diagnosis not present

## 2012-08-19 DIAGNOSIS — Z79899 Other long term (current) drug therapy: Secondary | ICD-10-CM | POA: Diagnosis not present

## 2012-08-19 DIAGNOSIS — R7989 Other specified abnormal findings of blood chemistry: Secondary | ICD-10-CM | POA: Diagnosis not present

## 2012-08-19 DIAGNOSIS — I10 Essential (primary) hypertension: Secondary | ICD-10-CM | POA: Diagnosis not present

## 2012-08-19 DIAGNOSIS — N289 Disorder of kidney and ureter, unspecified: Secondary | ICD-10-CM | POA: Diagnosis not present

## 2012-08-25 DIAGNOSIS — IMO0002 Reserved for concepts with insufficient information to code with codable children: Secondary | ICD-10-CM | POA: Diagnosis not present

## 2012-08-25 DIAGNOSIS — M545 Low back pain, unspecified: Secondary | ICD-10-CM | POA: Diagnosis not present

## 2012-08-25 DIAGNOSIS — M961 Postlaminectomy syndrome, not elsewhere classified: Secondary | ICD-10-CM | POA: Diagnosis not present

## 2012-08-25 DIAGNOSIS — G894 Chronic pain syndrome: Secondary | ICD-10-CM | POA: Diagnosis not present

## 2012-09-01 ENCOUNTER — Ambulatory Visit: Payer: Medicare Other | Admitting: Family Medicine

## 2012-09-02 ENCOUNTER — Ambulatory Visit (INDEPENDENT_AMBULATORY_CARE_PROVIDER_SITE_OTHER): Payer: Medicare Other | Admitting: Family Medicine

## 2012-09-02 ENCOUNTER — Encounter: Payer: Self-pay | Admitting: Family Medicine

## 2012-09-02 VITALS — BP 126/78 | HR 114 | Resp 18 | Ht 66.0 in | Wt 184.1 lb

## 2012-09-02 DIAGNOSIS — B351 Tinea unguium: Secondary | ICD-10-CM | POA: Insufficient documentation

## 2012-09-02 DIAGNOSIS — R7309 Other abnormal glucose: Secondary | ICD-10-CM

## 2012-09-02 DIAGNOSIS — J45909 Unspecified asthma, uncomplicated: Secondary | ICD-10-CM

## 2012-09-02 DIAGNOSIS — F411 Generalized anxiety disorder: Secondary | ICD-10-CM

## 2012-09-02 DIAGNOSIS — L03039 Cellulitis of unspecified toe: Secondary | ICD-10-CM

## 2012-09-02 DIAGNOSIS — IMO0002 Reserved for concepts with insufficient information to code with codable children: Secondary | ICD-10-CM

## 2012-09-02 DIAGNOSIS — J309 Allergic rhinitis, unspecified: Secondary | ICD-10-CM | POA: Diagnosis not present

## 2012-09-02 DIAGNOSIS — F3289 Other specified depressive episodes: Secondary | ICD-10-CM

## 2012-09-02 DIAGNOSIS — M5090 Cervical disc disorder, unspecified, unspecified cervical region: Secondary | ICD-10-CM

## 2012-09-02 DIAGNOSIS — Z23 Encounter for immunization: Secondary | ICD-10-CM

## 2012-09-02 DIAGNOSIS — F329 Major depressive disorder, single episode, unspecified: Secondary | ICD-10-CM

## 2012-09-02 DIAGNOSIS — R7303 Prediabetes: Secondary | ICD-10-CM

## 2012-09-02 DIAGNOSIS — I10 Essential (primary) hypertension: Secondary | ICD-10-CM

## 2012-09-02 MED ORDER — CEPHALEXIN 250 MG PO CAPS: 250.0000 mg | ORAL_CAPSULE | Freq: Four times a day (QID) | ORAL | Status: AC

## 2012-09-02 MED ORDER — TERBINAFINE HCL 250 MG PO TABS: 250.0000 mg | ORAL_TABLET | Freq: Every day | ORAL | Status: DC

## 2012-09-02 MED ORDER — FLUCONAZOLE 150 MG PO TABS: ORAL_TABLET | ORAL | Status: DC

## 2012-09-02 NOTE — Progress Notes (Signed)
  Subjective:    Patient ID: Jane English, female    DOB: [birth year], 41 y.o.   MRN: [id number]  HPI Pt in today , just had nerve stimulator removed yesterday, this has helped her pain significantly, now that is removed had a 10 pain requests script for cane until she gets this in 1 month Thickening of left great toenail and tenderness x 1 month Has been very depressed recently due to situation with one of her adult children, relying on her partner and her faith to see her through this. Not suicidal, homicidal or hallucinating   Review of Systems See HPI Denies recent fever or chills. Denies sinus pressure, nasal congestion, ear pain or sore throat. Denies chest congestion, productive cough or wheezing. Denies chest pains, palpitations and leg swelling Denies abdominal pain, nausea, vomiting,diarrhea or constipation.   Denies dysuria, frequency, hesitancy or incontinence.  Increased headache and difficulty sleeping in the past week due yo mental stress and physical pain Denies skin break down or rash.        Objective:   Physical Exam  Patient alert and oriented and in no cardiopulmonary distress.  HEENT: No facial asymmetry, EOMI, no sinus tenderness,  oropharynx pink and moist.  Neck decreased ROM no adenopathy.  Chest: Clear to auscultation bilaterally.  CVS: S1, S2 no murmurs, no S3.  ABD: Soft non tender. Bowel sounds normal.  Ext: No edema  MS: decreased ROM spine, adequate in shoulders, hips and knees.Pt ambulates with assistance due to pain  Skin: Intact, no ulcerations or rash noted.  Psych: Good eye contact,  Memory intact tearful, anxious and depressed appearing.  CNS: CN 2-12 intact, power, tone and sensation normal throughout.       Assessment & Plan:

## 2012-09-02 NOTE — Patient Instructions (Addendum)
F/u in 4 month  Flu vaccine today    You will get a script for cane

## 2012-09-03 DIAGNOSIS — Z23 Encounter for immunization: Secondary | ICD-10-CM

## 2012-09-03 LAB — HEMOGLOBIN A1C
Hgb A1c MFr Bld: 5.9 % — ABNORMAL HIGH (ref <5.7)
Mean Plasma Glucose: 123 mg/dL — ABNORMAL HIGH (ref <117)

## 2012-09-04 ENCOUNTER — Telehealth: Payer: Self-pay

## 2012-09-04 NOTE — Telephone Encounter (Signed)
pls advise the low dose of the [patch is safe to take with her current meds, and I encourage her to use meds prescribed, because pain management involves many different types of meds which work together

## 2012-09-04 NOTE — Telephone Encounter (Signed)
Patient aware.

## 2012-09-04 NOTE — Telephone Encounter (Signed)
Is concerned because Dr Eduard Clos gave her clonidine 0.1mg  patch for her nerve pain in her back. She said she was told it could drop her BP and to keep a check on it everyday. She doesn't want to use it because her BP is already doing good and she is scared it will go too low and she stays alone and doesn't want anything to happen. The pharmacist told her to check with her PCP before she used the patch because she would have to wear it for 7 days and wanted to make sure it would be okay with the other 2 BP meds she was already on. She doesn't see how a BP med can help her pain. Also was given doxepin and she said that was an antidepressant and she can't keep wasting what little money she does have on meds that's not going to help her pain

## 2012-09-07 DIAGNOSIS — M542 Cervicalgia: Secondary | ICD-10-CM | POA: Diagnosis not present

## 2012-09-10 ENCOUNTER — Ambulatory Visit: Payer: Medicare Other | Admitting: Family Medicine

## 2012-09-14 DIAGNOSIS — M542 Cervicalgia: Secondary | ICD-10-CM | POA: Diagnosis not present

## 2012-09-14 DIAGNOSIS — M5030 Other cervical disc degeneration, unspecified cervical region: Secondary | ICD-10-CM | POA: Diagnosis not present

## 2012-09-14 NOTE — Assessment & Plan Note (Signed)
Controlled, no change in medication  

## 2012-09-14 NOTE — Assessment & Plan Note (Signed)
Antifungal tablet a prescribed

## 2012-09-14 NOTE — Assessment & Plan Note (Signed)
Increased due to new situation with her son, however has good partner support relying on her faith at this time

## 2012-09-14 NOTE — Assessment & Plan Note (Signed)
Controlled, no change in medication DASH diet and commitment to daily physical activity for a minimum of 30 minutes discussed and encouraged, as a part of hypertension management. The importance of attaining a healthy weight is also discussed.  

## 2012-09-14 NOTE — Assessment & Plan Note (Signed)
Improved, though not reporting any anti depressant use at this time

## 2012-09-14 NOTE — Assessment & Plan Note (Signed)
S.p surgery still with chronic pain which is managed through the pain clinic

## 2012-09-14 NOTE — Assessment & Plan Note (Signed)
Currently being managed by pain clinic for symptom control

## 2012-09-15 ENCOUNTER — Telehealth: Payer: Self-pay | Admitting: Family Medicine

## 2012-09-16 NOTE — Telephone Encounter (Signed)
Jane English and I took care of this

## 2012-09-16 NOTE — Telephone Encounter (Signed)
Noted  

## 2012-09-18 ENCOUNTER — Ambulatory Visit (INDEPENDENT_AMBULATORY_CARE_PROVIDER_SITE_OTHER): Payer: Medicare Other

## 2012-09-18 ENCOUNTER — Ambulatory Visit: Payer: Medicare Other | Admitting: Family Medicine

## 2012-09-18 VITALS — BP 128/80 | Wt 184.0 lb

## 2012-09-18 DIAGNOSIS — Z111 Encounter for screening for respiratory tuberculosis: Secondary | ICD-10-CM

## 2012-09-18 NOTE — Progress Notes (Signed)
Patient in for ppd placement.  PPD placed in right arm.  Patient to return Monday to have test read.

## 2012-09-21 ENCOUNTER — Other Ambulatory Visit: Payer: Self-pay | Admitting: Family Medicine

## 2012-09-21 ENCOUNTER — Telehealth: Payer: Self-pay

## 2012-09-21 DIAGNOSIS — Z981 Arthrodesis status: Secondary | ICD-10-CM | POA: Diagnosis not present

## 2012-09-21 DIAGNOSIS — M542 Cervicalgia: Secondary | ICD-10-CM | POA: Diagnosis not present

## 2012-09-21 DIAGNOSIS — M6281 Muscle weakness (generalized): Secondary | ICD-10-CM | POA: Diagnosis not present

## 2012-09-21 DIAGNOSIS — IMO0001 Reserved for inherently not codable concepts without codable children: Secondary | ICD-10-CM | POA: Diagnosis not present

## 2012-09-21 DIAGNOSIS — G43909 Migraine, unspecified, not intractable, without status migrainosus: Secondary | ICD-10-CM | POA: Diagnosis not present

## 2012-09-21 NOTE — Telephone Encounter (Signed)
Pt states she collected xanax xr #90 instead of xanax 0.5mg  tid, she wants the old form back, she also stated she wanted the dose increased to 1mg  New script will be written and needs to be [facility]ed [facility] (NOT [facility]) she needs to stay at the same pharmacy

## 2012-09-21 NOTE — Telephone Encounter (Signed)
There were 2 historical xanax rx's on her list and the wrong one was refilled. She got the xanax 0.5 (XR) when it should have been the regular Xanax 0.5 tid. She said these are not doing her any good and she wants the ones she has been getting sent in to [facility]. She said she has been taking 2 of the 0.5mg  to make 1mg  because that's the only way they help her. Wants to see if the dose can be increased to the 1mg  also.

## 2012-09-22 ENCOUNTER — Other Ambulatory Visit: Payer: Self-pay

## 2012-09-22 ENCOUNTER — Telehealth: Payer: Self-pay | Admitting: Family Medicine

## 2012-09-22 MED ORDER — ALPRAZOLAM 0.5 MG PO TABS: 0.5000 mg | ORAL_TABLET | Freq: Three times a day (TID) | ORAL | Status: DC | PRN

## 2012-09-22 NOTE — Telephone Encounter (Signed)
Just wanting to clarify - in the message you said pt requested increase to the 1mg  and a new script would be written but then the 0.5 was entered historically. Just wanted to make sure that was the dose you meant to give. Will fax to [facility] as soon as I get the clarification

## 2012-09-22 NOTE — Telephone Encounter (Signed)
Spoke with patient and she is aware  

## 2012-09-22 NOTE — Telephone Encounter (Signed)
pls fax the old low dose 0.5mg 

## 2012-09-22 NOTE — Telephone Encounter (Signed)
This has been dealt with please let me know if still outsatnding

## 2012-09-23 NOTE — Telephone Encounter (Signed)
Pharmacy changed in system to only [facility]

## 2012-10-06 ENCOUNTER — Other Ambulatory Visit: Payer: Self-pay | Admitting: Family Medicine

## 2012-10-06 ENCOUNTER — Encounter: Payer: Medicare Other | Admitting: Family Medicine

## 2012-10-06 DIAGNOSIS — M961 Postlaminectomy syndrome, not elsewhere classified: Secondary | ICD-10-CM | POA: Diagnosis not present

## 2012-10-06 DIAGNOSIS — G894 Chronic pain syndrome: Secondary | ICD-10-CM | POA: Insufficient documentation

## 2012-10-06 DIAGNOSIS — Z79899 Other long term (current) drug therapy: Secondary | ICD-10-CM | POA: Diagnosis not present

## 2012-10-06 DIAGNOSIS — Z139 Encounter for screening, unspecified: Secondary | ICD-10-CM

## 2012-10-12 DIAGNOSIS — M542 Cervicalgia: Secondary | ICD-10-CM | POA: Diagnosis not present

## 2012-10-13 ENCOUNTER — Ambulatory Visit (HOSPITAL_COMMUNITY)
Admission: RE | Admit: 2012-10-13 | Discharge: 2012-10-13 | Disposition: A | Discharge status: Home / Self Care | Payer: Medicare Other | Source: Ambulatory Visit | Attending: Family Medicine | Admitting: Family Medicine

## 2012-10-13 DIAGNOSIS — Z1231 Encounter for screening mammogram for malignant neoplasm of breast: Secondary | ICD-10-CM | POA: Diagnosis not present

## 2012-10-13 DIAGNOSIS — Z139 Encounter for screening, unspecified: Secondary | ICD-10-CM

## 2012-10-14 DIAGNOSIS — M542 Cervicalgia: Secondary | ICD-10-CM | POA: Diagnosis not present

## 2012-10-14 DIAGNOSIS — IMO0001 Reserved for inherently not codable concepts without codable children: Secondary | ICD-10-CM | POA: Diagnosis not present

## 2012-10-14 DIAGNOSIS — M6281 Muscle weakness (generalized): Secondary | ICD-10-CM | POA: Diagnosis not present

## 2012-10-19 DIAGNOSIS — M542 Cervicalgia: Secondary | ICD-10-CM | POA: Diagnosis not present

## 2012-10-26 DIAGNOSIS — M6281 Muscle weakness (generalized): Secondary | ICD-10-CM | POA: Diagnosis not present

## 2012-10-26 DIAGNOSIS — IMO0001 Reserved for inherently not codable concepts without codable children: Secondary | ICD-10-CM | POA: Diagnosis not present

## 2012-10-26 DIAGNOSIS — M542 Cervicalgia: Secondary | ICD-10-CM | POA: Diagnosis not present

## 2012-10-29 ENCOUNTER — Ambulatory Visit (INDEPENDENT_AMBULATORY_CARE_PROVIDER_SITE_OTHER): Payer: Managed Care, Other (non HMO) | Admitting: Family Medicine

## 2012-10-29 ENCOUNTER — Encounter: Payer: Self-pay | Admitting: Family Medicine

## 2012-10-29 ENCOUNTER — Other Ambulatory Visit (HOSPITAL_COMMUNITY)
Admission: RE | Admit: 2012-10-29 | Discharge: 2012-10-29 | Disposition: A | Discharge status: Home / Self Care | Payer: Medicare Other | Source: Ambulatory Visit | Attending: Family Medicine | Admitting: Family Medicine

## 2012-10-29 ENCOUNTER — Other Ambulatory Visit: Payer: Self-pay | Admitting: Family Medicine

## 2012-10-29 ENCOUNTER — Telehealth: Payer: Self-pay | Admitting: Family Medicine

## 2012-10-29 VITALS — BP 136/80 | HR 106 | Resp 18 | Ht 66.0 in | Wt 192.0 lb

## 2012-10-29 DIAGNOSIS — R252 Cramp and spasm: Secondary | ICD-10-CM | POA: Diagnosis not present

## 2012-10-29 DIAGNOSIS — Z01419 Encounter for gynecological examination (general) (routine) without abnormal findings: Secondary | ICD-10-CM | POA: Diagnosis not present

## 2012-10-29 DIAGNOSIS — F329 Major depressive disorder, single episode, unspecified: Secondary | ICD-10-CM | POA: Diagnosis not present

## 2012-10-29 DIAGNOSIS — M542 Cervicalgia: Secondary | ICD-10-CM | POA: Diagnosis not present

## 2012-10-29 DIAGNOSIS — N3000 Acute cystitis without hematuria: Secondary | ICD-10-CM

## 2012-10-29 DIAGNOSIS — N771 Vaginitis, vulvitis and vulvovaginitis in diseases classified elsewhere: Secondary | ICD-10-CM

## 2012-10-29 DIAGNOSIS — Z124 Encounter for screening for malignant neoplasm of cervix: Secondary | ICD-10-CM | POA: Insufficient documentation

## 2012-10-29 DIAGNOSIS — Z1211 Encounter for screening for malignant neoplasm of colon: Secondary | ICD-10-CM

## 2012-10-29 DIAGNOSIS — K219 Gastro-esophageal reflux disease without esophagitis: Secondary | ICD-10-CM | POA: Insufficient documentation

## 2012-10-29 DIAGNOSIS — N926 Irregular menstruation, unspecified: Secondary | ICD-10-CM

## 2012-10-29 DIAGNOSIS — M6281 Muscle weakness (generalized): Secondary | ICD-10-CM | POA: Diagnosis not present

## 2012-10-29 DIAGNOSIS — M5090 Cervical disc disorder, unspecified, unspecified cervical region: Secondary | ICD-10-CM

## 2012-10-29 DIAGNOSIS — IMO0001 Reserved for inherently not codable concepts without codable children: Secondary | ICD-10-CM | POA: Diagnosis not present

## 2012-10-29 DIAGNOSIS — Z0000 Encounter for general adult medical examination without abnormal findings: Secondary | ICD-10-CM | POA: Diagnosis not present

## 2012-10-29 LAB — POC HEMOCCULT BLD/STL (OFFICE/1-CARD/DIAGNOSTIC): Fecal Occult Blood, POC: NEGATIVE

## 2012-10-29 LAB — POCT URINALYSIS DIPSTICK
Bilirubin, UA: NEGATIVE
Blood, UA: NEGATIVE
Glucose, UA: NEGATIVE
Leukocytes, UA: NEGATIVE
Nitrite, UA: NEGATIVE
Urobilinogen, UA: 0.2

## 2012-10-29 LAB — POCT URINE PREGNANCY: Preg Test, Ur: NEGATIVE

## 2012-10-29 MED ORDER — PANTOPRAZOLE SODIUM 20 MG PO TBEC: 20.0000 mg | DELAYED_RELEASE_TABLET | Freq: Every day | ORAL | Status: DC

## 2012-10-29 MED ORDER — FLUOXETINE HCL (PMDD) 10 MG PO TABS: 1.0000 | ORAL_TABLET | Freq: Every day | ORAL | Status: DC

## 2012-10-29 NOTE — Patient Instructions (Addendum)
F/u in 6 weeks.  Please start new medication for uncontrolled anxiety and depression, fluoxetine 10mg  daily, you have used this with good success in the past.  I hope that the nerve stimulator helps with the back pain , and I hope that the neck and left upper extremity pain improves.  Urine test shows no infection and you are not pregnant. I believe that stress and anxiety are out of control and are contributing to some other symptoms like irregular period and urinary frequency.  Labs today, cmp , calcium and magnesium, to check on potassium and leg cramps   I will send for last visit with Dr Jeral Fruit in mid December, if he specifically requests I will refer for further evaluation as you report he is mentioning possible fibromyalgia. If he does think this may be a problem, you will be referred.

## 2012-10-29 NOTE — Assessment & Plan Note (Signed)
Deterioration, c/o increased and unconrtolled left neck and upper extremity pain following recent c spine surgery

## 2012-10-29 NOTE — Progress Notes (Signed)
  Subjective:    Patient ID: Jane English, female    DOB: 15-Jun-1971, 41 y.o.   MRN: [id number]  HPI Pt in for annual pelvic and breast exam.She has significant concerns about unchanged neck and left upper extremity pain following recent c spine surgery, feels symptoms are worse, c/o upper extremity , left , weakness and numbness. Reports may have fibromyalgia and may need treatment for this to improve her symptoms. Will get nerve stimulator implanted at [facility] in the next 2 days for chromic low back pain manage,ment and hopefully this will improve ger overall pain level.c/o being stressed, anxious, overwhelmed with regard to her health, has had severe mental decompensation in the past after lumbar surgery and is worried that she will have a recurrence. She denies suicidal or homicidal deation or hallucinations C/o menstrual irregularity and urinary frequency in the last 4 to 6 weeks, wants to know if perimenopausal or is this all stress related   Review of Systems See HPI Denies recent fever or chills. Denies sinus pressure, nasal congestion, ear pain or sore throat. Denies chest congestion, productive cough or wheezing. Denies chest pains, palpitations and leg swelling Denies abdominal pain, nausea, vomiting,diarrhea or constipation.   Denies headaches, seizures, numbness, or tingling. Denies skin break down or rash.        Objective:   Physical Exam Pleasant well nourished female, alert and oriented x 3, in no cardio-pulmonary distress. Afebrile. HEENT No facial trauma or asymetry. Sinuses non tender.  EOMI, PERTL, fundoscopic exam is normal, no hemorhage or exudate.  External ears normal, tympanic membranes clear. Oropharynx moist, no exudate, good dentition. Neck: supple, no adenopathy,JVD or thyromegaly.No bruits.  Chest: Clear to ascultation bilaterally.No crackles or wheezes. Non tender to palpation  Breast: No asymetry,no masses. No nipple discharge or  inversion.No abnormality in skin, no hyperpigmentation or palpable skin lesions No axillary or supraclavicular adenopathy  Cardiovascular system; Heart sounds normal,  S1 and  S2 ,no S3.  No murmur, or thrill. Apical beat not displaced Peripheral pulses normal.  Abdomen: Soft, non tender, no organomegaly or masses. No bruits. Bowel sounds normal. No guarding, tenderness or rebound.  Rectal:  No mass. Guaiac negative stool.  GU: External genitalia normal. No lesions.Normal female distribution of ir Vaginal canal normal.White thick physiologic appearing discharge discharge. Uterus normal size, no adnexal masses, no cervical motion or adnexal tenderness.  Musculoskeletal exam: Full ROM of spine, hips , shoulders and knees. No deformity ,swelling or crepitus noted. No muscle wasting or atrophy.   Neurologic: Cranial nerves 2 to 12 intact. Power,decreased in left upper extremity, grade 4 No disturbance in gait. No tremor.  Skin: Intact, no ulceration, erythema , scaling or rash noted. Pigmentation normal throughout  Psych; Anxious, depressed and at times tearful      Assessment & Plan:

## 2012-10-29 NOTE — Telephone Encounter (Signed)
Pls let pt know that though her insurance does not cover terbinafine for fungal nail infection, cost at [facility] is $4 per 30 day supply. Ins sent a letter stating not covered

## 2012-10-30 ENCOUNTER — Other Ambulatory Visit: Payer: Self-pay | Admitting: Family Medicine

## 2012-10-30 DIAGNOSIS — M545 Low back pain, unspecified: Secondary | ICD-10-CM | POA: Diagnosis not present

## 2012-10-30 DIAGNOSIS — J45909 Unspecified asthma, uncomplicated: Secondary | ICD-10-CM | POA: Diagnosis not present

## 2012-10-30 DIAGNOSIS — G894 Chronic pain syndrome: Secondary | ICD-10-CM | POA: Diagnosis not present

## 2012-10-30 DIAGNOSIS — M961 Postlaminectomy syndrome, not elsewhere classified: Secondary | ICD-10-CM | POA: Diagnosis not present

## 2012-10-30 LAB — COMPREHENSIVE METABOLIC PANEL
Alkaline Phosphatase: 55 U/L (ref 39–117)
BUN: 20 mg/dL (ref 6–23)
CO2: 30 mEq/L (ref 19–32)
Creat: 1.06 mg/dL (ref 0.50–1.10)
Glucose, Bld: 120 mg/dL — ABNORMAL HIGH (ref 70–99)
Sodium: 136 mEq/L (ref 135–145)
Total Bilirubin: 0.3 mg/dL (ref 0.3–1.2)
Total Protein: 7.6 g/dL (ref 6.0–8.3)

## 2012-10-30 LAB — MAGNESIUM: Magnesium: 1.9 mg/dL (ref 1.5–2.5)

## 2012-10-30 NOTE — Telephone Encounter (Signed)
Called patient and left message for them to return call at the office   

## 2012-10-30 NOTE — Telephone Encounter (Signed)
Patient in for visit.

## 2012-11-03 DIAGNOSIS — G894 Chronic pain syndrome: Secondary | ICD-10-CM | POA: Diagnosis not present

## 2012-11-03 DIAGNOSIS — IMO0001 Reserved for inherently not codable concepts without codable children: Secondary | ICD-10-CM | POA: Diagnosis not present

## 2012-11-03 DIAGNOSIS — K5900 Constipation, unspecified: Secondary | ICD-10-CM | POA: Diagnosis not present

## 2012-11-03 DIAGNOSIS — M549 Dorsalgia, unspecified: Secondary | ICD-10-CM | POA: Diagnosis not present

## 2012-11-04 DIAGNOSIS — N3000 Acute cystitis without hematuria: Secondary | ICD-10-CM | POA: Insufficient documentation

## 2012-11-04 DIAGNOSIS — N926 Irregular menstruation, unspecified: Secondary | ICD-10-CM | POA: Insufficient documentation

## 2012-11-04 DIAGNOSIS — Z0000 Encounter for general adult medical examination without abnormal findings: Secondary | ICD-10-CM | POA: Insufficient documentation

## 2012-11-04 NOTE — Assessment & Plan Note (Signed)
Urine pregnancy test negative. Likely due to stress

## 2012-11-04 NOTE — Assessment & Plan Note (Signed)
C/o uncontrolled pain and left upper extremity weakness , states she was told she may have fibromyalgia by neurosurgeon. I advised , based on her symptoms I doubt this is the case, but will f/u o his recommendations to see if she needs referral for further eval for this

## 2012-11-04 NOTE — Assessment & Plan Note (Signed)
CCUA in office negative for infection 

## 2012-11-04 NOTE — Assessment & Plan Note (Signed)
Pelvic and breast and rectal exam completed as documented. Discussed with pt the need to commit to regular physical activity to improve mental and physical health.Need to commit to weight loss Screening and immunization are up to date

## 2012-11-04 NOTE — Assessment & Plan Note (Signed)
Samples sent for testing for infection per pt request

## 2012-11-05 DIAGNOSIS — G894 Chronic pain syndrome: Secondary | ICD-10-CM | POA: Diagnosis not present

## 2012-11-05 DIAGNOSIS — M961 Postlaminectomy syndrome, not elsewhere classified: Secondary | ICD-10-CM | POA: Diagnosis not present

## 2012-11-05 DIAGNOSIS — Z9889 Other specified postprocedural states: Secondary | ICD-10-CM | POA: Diagnosis not present

## 2012-11-05 LAB — HSV 1 AND 2 IGM ABS, INDIRECT

## 2012-11-05 LAB — HSV(HERPES SIMPLEX VRS) I + II AB-IGG

## 2012-11-09 ENCOUNTER — Other Ambulatory Visit: Payer: Self-pay

## 2012-11-09 MED ORDER — POTASSIUM CHLORIDE 20 MEQ/15ML (10%) PO SOLN: ORAL | Status: DC

## 2012-11-12 DIAGNOSIS — M961 Postlaminectomy syndrome, not elsewhere classified: Secondary | ICD-10-CM | POA: Diagnosis not present

## 2012-11-12 DIAGNOSIS — Z462 Encounter for fitting and adjustment of other devices related to nervous system and special senses: Secondary | ICD-10-CM | POA: Diagnosis not present

## 2012-11-12 DIAGNOSIS — G894 Chronic pain syndrome: Secondary | ICD-10-CM | POA: Diagnosis not present

## 2012-11-12 DIAGNOSIS — Z9889 Other specified postprocedural states: Secondary | ICD-10-CM | POA: Diagnosis not present

## 2012-11-16 DIAGNOSIS — M542 Cervicalgia: Secondary | ICD-10-CM | POA: Diagnosis not present

## 2012-11-18 ENCOUNTER — Telehealth: Payer: Self-pay | Admitting: Family Medicine

## 2012-11-18 ENCOUNTER — Other Ambulatory Visit: Payer: Self-pay | Admitting: Family Medicine

## 2012-11-18 DIAGNOSIS — R51 Headache: Secondary | ICD-10-CM

## 2012-11-18 NOTE — Progress Notes (Signed)
Pt was referred to [facility].

## 2012-11-18 NOTE — Telephone Encounter (Signed)
pls refer pt to neurologist re uncontrolled headaches

## 2012-11-23 ENCOUNTER — Telehealth: Payer: Self-pay | Admitting: Family Medicine

## 2012-11-23 NOTE — Telephone Encounter (Signed)
This is being worked on last week, please give me an update on the referral so pt can get the info she is requesting

## 2012-11-24 NOTE — Telephone Encounter (Signed)
Sallie sent the paper work to [facility] and I called them this morning and they stated they left message for patient to call and schedule her appointment on 11/20/2012

## 2012-11-25 ENCOUNTER — Telehealth: Payer: Self-pay | Admitting: Family Medicine

## 2012-11-25 NOTE — Telephone Encounter (Signed)
Spoke with patient she is aware of the appointment

## 2012-11-30 ENCOUNTER — Other Ambulatory Visit: Payer: Self-pay | Admitting: Family Medicine

## 2012-12-03 ENCOUNTER — Other Ambulatory Visit: Payer: Self-pay | Admitting: Neurology

## 2012-12-03 DIAGNOSIS — M545 Low back pain, unspecified: Secondary | ICD-10-CM

## 2012-12-03 DIAGNOSIS — F329 Major depressive disorder, single episode, unspecified: Secondary | ICD-10-CM

## 2012-12-03 DIAGNOSIS — I10 Essential (primary) hypertension: Secondary | ICD-10-CM | POA: Diagnosis not present

## 2012-12-03 DIAGNOSIS — R51 Headache: Secondary | ICD-10-CM

## 2012-12-03 DIAGNOSIS — M542 Cervicalgia: Secondary | ICD-10-CM

## 2012-12-05 ENCOUNTER — Encounter (HOSPITAL_COMMUNITY): Payer: Self-pay | Admitting: *Deleted

## 2012-12-05 ENCOUNTER — Emergency Department (HOSPITAL_COMMUNITY)
Admission: EM | Admit: 2012-12-05 | Discharge: 2012-12-05 | Disposition: A | Discharge status: Home / Self Care | Payer: Medicare Other | Attending: Emergency Medicine | Admitting: Emergency Medicine

## 2012-12-05 DIAGNOSIS — F3289 Other specified depressive episodes: Secondary | ICD-10-CM | POA: Insufficient documentation

## 2012-12-05 DIAGNOSIS — Z859 Personal history of malignant neoplasm, unspecified: Secondary | ICD-10-CM | POA: Insufficient documentation

## 2012-12-05 DIAGNOSIS — F329 Major depressive disorder, single episode, unspecified: Secondary | ICD-10-CM | POA: Insufficient documentation

## 2012-12-05 DIAGNOSIS — Z8679 Personal history of other diseases of the circulatory system: Secondary | ICD-10-CM | POA: Insufficient documentation

## 2012-12-05 DIAGNOSIS — R51 Headache: Secondary | ICD-10-CM | POA: Insufficient documentation

## 2012-12-05 DIAGNOSIS — R209 Unspecified disturbances of skin sensation: Secondary | ICD-10-CM | POA: Insufficient documentation

## 2012-12-05 DIAGNOSIS — I10 Essential (primary) hypertension: Secondary | ICD-10-CM | POA: Diagnosis not present

## 2012-12-05 DIAGNOSIS — E669 Obesity, unspecified: Secondary | ICD-10-CM | POA: Diagnosis not present

## 2012-12-05 DIAGNOSIS — R5381 Other malaise: Secondary | ICD-10-CM | POA: Diagnosis not present

## 2012-12-05 DIAGNOSIS — Z79899 Other long term (current) drug therapy: Secondary | ICD-10-CM | POA: Insufficient documentation

## 2012-12-05 DIAGNOSIS — H9319 Tinnitus, unspecified ear: Secondary | ICD-10-CM | POA: Diagnosis not present

## 2012-12-05 DIAGNOSIS — G8929 Other chronic pain: Secondary | ICD-10-CM | POA: Diagnosis not present

## 2012-12-05 DIAGNOSIS — Z8709 Personal history of other diseases of the respiratory system: Secondary | ICD-10-CM | POA: Insufficient documentation

## 2012-12-05 DIAGNOSIS — Z86718 Personal history of other venous thrombosis and embolism: Secondary | ICD-10-CM | POA: Diagnosis not present

## 2012-12-05 DIAGNOSIS — R011 Cardiac murmur, unspecified: Secondary | ICD-10-CM | POA: Diagnosis not present

## 2012-12-05 DIAGNOSIS — J45909 Unspecified asthma, uncomplicated: Secondary | ICD-10-CM | POA: Diagnosis not present

## 2012-12-05 DIAGNOSIS — M542 Cervicalgia: Secondary | ICD-10-CM | POA: Diagnosis not present

## 2012-12-05 DIAGNOSIS — R5383 Other fatigue: Secondary | ICD-10-CM | POA: Insufficient documentation

## 2012-12-05 MED ORDER — HYDROMORPHONE HCL PF 1 MG/ML IJ SOLN
1.0000 mg | Freq: Once | INTRAMUSCULAR | Status: AC
  Administered 2012-12-05: 1 mg via INTRAVENOUS
  Filled 2012-12-05: qty 1

## 2012-12-05 MED ORDER — SODIUM CHLORIDE 0.9 % IV BOLUS (SEPSIS)
1000.0000 mL | Freq: Once | INTRAVENOUS | Status: AC
  Administered 2012-12-05: 1000 mL via INTRAVENOUS

## 2012-12-05 MED ORDER — HYDROMORPHONE HCL PF 1 MG/ML IJ SOLN
0.5000 mg | Freq: Once | INTRAMUSCULAR | Status: AC
  Administered 2012-12-05: 0.5 mg via INTRAVENOUS
  Filled 2012-12-05: qty 1

## 2012-12-05 NOTE — ED Provider Notes (Signed)
History     CSN: [id number]  Arrival date & time 12/05/12  0558   First MD Initiated Contact with Patient 12/05/12 [id number]      Chief Complaint  Patient presents with  . Headache  . Neck Pain  . Back Pain    (Consider location/radiation/quality/duration/timing/severity/associated sxs/prior treatment) HPI Comments: 42 y.o. female presents today complaining of worsening chronic back, head, and neck pain. Pt being treated by several doctors for chronic issues. Scheduled for CT of cervical spine and nerve conductio test with Dr. Maple Hudson ("a brain specialist"). Pt has pain management medications at home, but none have relieved the pain that started last night. Patient rates pain 10/10. Moving makes it worse. Lying on her left side makes it better. Pain is persistent.  Pt denies new trauma, nausea/vomiting, change in bowel/urinary continence, saddle anesthesia.  Pt had surgery for degenerative disk dz on 10/06/12 with Dr. Cherrie Distance for spine stimulator to help with chronic pain issues.   Dr. Syliva Overman - PCP Dr. Merrilee Seashore - Spine Dr. Cherrie Distance - surgeon Dr. Maple Hudson - Brain specialist [facility]/Dr. Cherrie Distance - pain management   Past Medical History  Diagnosis Date  . Migraine headache   . Back pain   . Obesity   . Hypertension   . DVT (deep venous thrombosis) 2010  . Heart murmur     no cardiologist  . Asthma   . Bronchitis   . Shortness of breath     if having asthma attack  . Depression     Past Surgical History  Procedure Date  . Appendectomy   . Carpal tunnel release   . Tubal ligation   . Lumbar spine surgery 2010    x2  . Back surgery   . Laparoscopic lysis intestinal adhesions   . Finger surgery     right pinkie finger  . Anterior cervical decomp/discectomy fusion 07/07/2012    Procedure: ANTERIOR CERVICAL DECOMPRESSION/DISCECTOMY FUSION 2 LEVELS;  Surgeon: Karn Cassis, MD;  Location: [facility];  Service: Neurosurgery;  Laterality: N/A;  Cervical four-five, five -  six  Anterior cervical decompression/diskectomy/fusion/plate    Family History  Problem Relation Age of Onset  . Lung cancer Father   . Stomach cancer Father   . Cancer Father   . Diabetes Sister   . Hypertension Sister   . Alcohol abuse Sister   . Diabetes Sister   . Mental illness Sister   . Hypertension Sister   . Alcohol abuse Brother   . Hypertension Brother   . Kidney disease Brother   . Diabetes Brother   . Alcohol abuse Brother   . Hypertension Brother   . Diabetes Brother   . Alcohol abuse Brother   . Hypertension Brother   . Diabetes Brother   . Alcohol abuse Brother     History  Substance Use Topics  . Smoking status: Never Smoker   . Smokeless tobacco: Not on file  . Alcohol Use: No    OB History    Grav Para Term Preterm Abortions TAB SAB Ect Mult Living                  Review of Systems  Constitutional: Negative for fever, chills and diaphoresis.  HENT: Positive for neck pain, neck stiffness and tinnitus. Negative for congestion, facial swelling and rhinorrhea.        Left ear tinnitus  Eyes: Negative for photophobia and visual disturbance.  Respiratory: Negative for apnea, chest tightness and shortness of breath.  Cardiovascular: Negative for chest pain and palpitations.  Gastrointestinal: Negative for nausea, vomiting, diarrhea and constipation.  Genitourinary: Negative for dysuria and difficulty urinating.  Musculoskeletal: Positive for back pain. Negative for gait problem.       Recovering from recent lumbar spine surgery where a spinal stimulator was implanted 10/06/12. Baseline left sided weakness to upper and lower extremity.   Skin:       Healing surgical site on lumbar spine, midline and right lateral flank.  Neurological: Positive for weakness and numbness. Negative for dizziness, tremors, syncope, light-headedness and headaches.       Baseline decreased sensation to light touch left-sided.    Allergies  Ibuprofen; Penicillins; and  Vicodin  Home Medications   Current Outpatient Rx  Name  Route  Sig  Dispense  Refill  . ACYCLOVIR 800 MG PO TABS      take 1 tablet by mouth three times a day   90 tablet   2   . ALBUTEROL SULFATE HFA 108 (90 BASE) MCG/ACT IN AERS   Inhalation   Inhale 2 puffs into the lungs every 6 (six) hours as needed for wheezing.   1 Inhaler   3   . ALBUTEROL SULFATE (2.5 MG/3ML) 0.083% IN NEBU   Nebulization   Take 3 mLs (2.5 mg total) by nebulization every 6 (six) hours as needed. Use one vial per nebulizer every 6 hours as needed for wheezing   75 mL   3   . ALPRAZOLAM 0.5 MG PO TABS   Oral   Take 1 tablet (0.5 mg total) by mouth 3 (three) times daily as needed for sleep or anxiety.   90 tablet   5   . AMLODIPINE BESYLATE 10 MG PO TABS   Oral   Take 1 tablet (10 mg total) by mouth daily.   90 tablet   3     Please dispense 90 day supply   . BACLOFEN 10 MG PO TABS   Oral   Take 10 mg by mouth 3 (three) times daily.         Marland Kitchen BIOTIN 5 MG PO CAPS   Oral   Take 2 capsules by mouth daily.         . BUDESONIDE-FORMOTEROL FUMARATE 160-4.5 MCG/ACT IN AERO   Inhalation   Inhale 2 puffs into the lungs 2 (two) times daily.   1 Inhaler   12     Dose increase effective 02/05/2012   . CETIRIZINE HCL 10 MG PO TABS   Oral   Take 10 mg by mouth daily.         Marland Kitchen DIAZEPAM 5 MG PO TABS   Oral   Take 5 mg by mouth every 6 (six) hours as needed. For anxiety         . FLUTICASONE PROPIONATE 50 MCG/ACT NA SUSP      instill 2 sprays into each nostril once daily   16 g   2   . GABAPENTIN 300 MG PO CAPS   Oral   Take 300 mg by mouth 3 (three) times daily.         Marland Kitchen MONTELUKAST SODIUM 10 MG PO TABS   Oral   Take 1 tablet (10 mg total) by mouth at bedtime.   90 tablet   1   . ADULT MULTIVITAMIN W/MINERALS CH   Oral   Take 1 tablet by mouth daily.         . OXYCODONE HCL 15 MG PO TABS  Oral   Take 15 mg by mouth every 4 (four) hours as needed. For  breakthrough pain         . TOPIRAMATE 50 MG PO TABS   Oral   Take 50 mg by mouth 2 (two) times daily.         . TRIAMTERENE-HCTZ 75-50 MG PO TABS   Oral   Take 1 tablet by mouth daily.   90 tablet   3     Dose increase effective 04/27/2012     BP 108/66  Pulse 94  Temp 97.7 F (36.5 C) (Oral)  Ht 5\' 8"  (1.727 m)  Wt 188 lb (85.276 kg)  BMI 28.59 kg/m2  SpO2 100%  Physical Exam  Nursing note and vitals reviewed. Constitutional: She is oriented to person, place, and time. She appears well-developed and well-nourished.       uncomfortable  HENT:  Head: Normocephalic and atraumatic.  Eyes: Conjunctivae normal and EOM are normal.  Neck: Neck supple.       No meningeal signs, limited ROM secondary to pain  Cardiovascular: Normal rate, regular rhythm, normal heart sounds and intact distal pulses.  Exam reveals no gallop and no friction rub.   No murmur heard. Pulmonary/Chest: Effort normal and breath sounds normal. No respiratory distress. She has no wheezes. She has no rales. She exhibits no tenderness.  Abdominal: Soft. Bowel sounds are normal. She exhibits no distension. There is no tenderness. There is no rebound and no guarding.  Musculoskeletal: Normal range of motion. She exhibits no edema and no tenderness.       Baseline decreased strength on left side, grip, upper and lower extremities (4/5). 5/5 strength on right side.   Lymphadenopathy:    She has no cervical adenopathy.  Neurological: She is alert and oriented to person, place, and time. No cranial nerve deficit.       No new focal deficits. Baseline decreased sensation to light touch on left side, upper and lower extremities.  Skin: Skin is warm and dry. She is not diaphoretic. No erythema.       Healing surgical site at lumbar spine and right lateral flank. Some redness. No edema, no pus, no warmth to touch.     ED Course  Procedures (including critical care time)  Labs Reviewed - No data to display No  results found.   No diagnosis found.    MDM  Pt has multiple doctors to manager her pain and treat her chronic back problems. No new trauma, no new symptoms, no neuro deficits, normal neuro exam. Wants help managing the pain for today, specifically of cervical spine. Pt rates pain 10/10. Patient can walk but states is painful.  No loss of bowel or bladder control.  No concern for cauda equina.  No fever, night sweats, weight loss, h/o cancer, IVDU.  Will treat pain, monitor vitals and re-assess. Pt is not narcotic naive taking oxycodone 15mg  every 4 hours that "did not touch" her pain. Started low dose Dilaudid and increased slowly, monitoring vitals, until pt was more comfortable, range of motion improved. Discussed with patient follow up with chronic pain management team and keeping appointments with health care providers. Pt in agreement with plan. Dr. Radford Pax is in agreement with plan.  Glade Nurse, PA-C 12/05/12 2114

## 2012-12-05 NOTE — ED Notes (Signed)
The patient states that she had she had surgery for degenerative disk disease on 10/06/12.  She states that she has had head, neck, and back pain since approximately 2 weeks after the surgery that got worse tonight.

## 2012-12-06 NOTE — ED Provider Notes (Signed)
Medical screening examination/treatment/procedure(s) were performed by non-physician practitioner and as supervising physician I was immediately available for consultation/collaboration.    Mariel Gaudin L Jaryn Hocutt, MD 12/06/12 0709 

## 2012-12-10 ENCOUNTER — Ambulatory Visit (INDEPENDENT_AMBULATORY_CARE_PROVIDER_SITE_OTHER): Payer: Medicare Other | Admitting: Family Medicine

## 2012-12-10 ENCOUNTER — Encounter: Payer: Self-pay | Admitting: Family Medicine

## 2012-12-10 VITALS — BP 130/74 | HR 95 | Resp 16 | Ht 66.0 in | Wt 189.0 lb

## 2012-12-10 DIAGNOSIS — I10 Essential (primary) hypertension: Secondary | ICD-10-CM | POA: Diagnosis not present

## 2012-12-10 DIAGNOSIS — H1010 Acute atopic conjunctivitis, unspecified eye: Secondary | ICD-10-CM | POA: Insufficient documentation

## 2012-12-10 DIAGNOSIS — M62838 Other muscle spasm: Secondary | ICD-10-CM

## 2012-12-10 DIAGNOSIS — J302 Other seasonal allergic rhinitis: Secondary | ICD-10-CM

## 2012-12-10 DIAGNOSIS — F329 Major depressive disorder, single episode, unspecified: Secondary | ICD-10-CM | POA: Diagnosis not present

## 2012-12-10 DIAGNOSIS — F32A Depression, unspecified: Secondary | ICD-10-CM

## 2012-12-10 DIAGNOSIS — J45909 Unspecified asthma, uncomplicated: Secondary | ICD-10-CM

## 2012-12-10 DIAGNOSIS — R51 Headache: Secondary | ICD-10-CM

## 2012-12-10 DIAGNOSIS — J309 Allergic rhinitis, unspecified: Secondary | ICD-10-CM | POA: Diagnosis not present

## 2012-12-10 HISTORY — DX: Other seasonal allergic rhinitis: J30.2

## 2012-12-10 MED ORDER — POTASSIUM CHLORIDE ER 10 MEQ PO TBCR: EXTENDED_RELEASE_TABLET | ORAL | Status: DC

## 2012-12-10 MED ORDER — MONTELUKAST SODIUM 10 MG PO TABS: 10.0000 mg | ORAL_TABLET | Freq: Every day | ORAL | Status: DC

## 2012-12-10 NOTE — Patient Instructions (Addendum)
F/u in May, call if you need me before  Your blood pressure is normal, continue current meds  I hope that you get relief from the uncontrolled pain you have in the right buttock and also the headaches and difficulty raising your head at times.

## 2012-12-10 NOTE — Progress Notes (Signed)
  Subjective:    Patient ID: Jane English, female    DOB: 06/12/1971, 42 y.o.   MRN: [id number]  HPI  Pt in for ED  Follow up, she was seen there for headache, and was told her bP was low, she is here  for re evaluation. Today, while on her regular meds her blood pressure is normal so she is maintained on this. She is very distressed re   Placement of the TENS unit 12/27 at [facility], c/o muscles cut up in her buttock, states she cannot lie flat anymore since the unit was put in, Looking  Forward to seeing the surgeon who put this in on 12/24/2012. Quality of life has deteriorated since her c spine surgery, c/o ongoing headache, and can't raise her head on her own, her fiance has to raise up her head. She is now seeing a neurologist for this  Review of Systems See HPI Denies recent fever or chills. Denies sinus pressure, nasal congestion, ear pain or sore throat. Denies chest congestion, productive cough or wheezing. Denies chest pains, palpitations and leg swelling Denies abdominal pain, nausea, vomiting,diarrhea or constipation.   Denies dysuria, frequency, hesitancy or incontinence. Denies joint pain, swelling and limitation in mobility.  Denies skin break down or rash.        Objective:   Physical Exam  Patient alert and oriented and in no cardiopulmonary distress.  HEENT: No facial asymmetry, EOMI, no sinus tenderness,  oropharynx pink and moist.  Neck decreased though adequate ROM, no adenopathy.  Chest: Clear to auscultation bilaterally.  CVS: S1, S2 no murmurs, no S3.  ABD: Soft non tender. Bowel sounds normal.  Ext: No edema  WU:[id number]  ROM spine,adequate in  shoulders, hips and knees.  Psych: Good eye contact, . Memory intact anxious and  depressed appearing.  CNS: CN 2-12 intact, power,  normal throughout.       Assessment & Plan:

## 2012-12-11 ENCOUNTER — Ambulatory Visit
Admission: RE | Admit: 2012-12-11 | Discharge: 2012-12-11 | Disposition: A | Discharge status: Home / Self Care | Payer: Medicare Other | Source: Ambulatory Visit | Attending: Neurology | Admitting: Neurology

## 2012-12-11 ENCOUNTER — Other Ambulatory Visit: Payer: Medicare Other

## 2012-12-11 DIAGNOSIS — M545 Low back pain, unspecified: Secondary | ICD-10-CM | POA: Diagnosis not present

## 2012-12-11 DIAGNOSIS — I10 Essential (primary) hypertension: Secondary | ICD-10-CM

## 2012-12-11 DIAGNOSIS — R51 Headache: Secondary | ICD-10-CM | POA: Diagnosis not present

## 2012-12-11 DIAGNOSIS — F329 Major depressive disorder, single episode, unspecified: Secondary | ICD-10-CM

## 2012-12-11 DIAGNOSIS — M542 Cervicalgia: Secondary | ICD-10-CM

## 2012-12-12 ENCOUNTER — Telehealth: Payer: Self-pay | Admitting: Family Medicine

## 2012-12-12 DIAGNOSIS — I10 Essential (primary) hypertension: Secondary | ICD-10-CM

## 2012-12-12 DIAGNOSIS — Z113 Encounter for screening for infections with a predominantly sexual mode of transmission: Secondary | ICD-10-CM

## 2012-12-12 DIAGNOSIS — E785 Hyperlipidemia, unspecified: Secondary | ICD-10-CM

## 2012-12-12 DIAGNOSIS — R7303 Prediabetes: Secondary | ICD-10-CM

## 2012-12-12 DIAGNOSIS — R7309 Other abnormal glucose: Secondary | ICD-10-CM | POA: Diagnosis not present

## 2012-12-12 NOTE — Addendum Note (Signed)
Addended by: Kandis Fantasia B on: 12/12/2012 10:01 PM   Modules accepted: Orders

## 2012-12-12 NOTE — Assessment & Plan Note (Signed)
Increased symptoms of depression and anxiety following 2 recent procedures, neck surgery and implantation of nerve stimulator. Therapy refused currently. Pt has decompensated in the past following surgery, not currently suicidal or homicidal, no hallucinations

## 2012-12-12 NOTE — Assessment & Plan Note (Signed)
Uncontrolled and no relief reported following recent surgery, currently being evalauated by neurology

## 2012-12-12 NOTE — Telephone Encounter (Signed)
Labs ordered.  Patient to be notified.

## 2012-12-12 NOTE — Assessment & Plan Note (Signed)
Uncontrolled, currently being evaluated by neurology

## 2012-12-12 NOTE — Telephone Encounter (Signed)
Pls advise pt she needs fasting labs the week of 2/10 if possible, pls order lipid, hSV2, HBA1C

## 2012-12-12 NOTE — Assessment & Plan Note (Signed)
Controlled, no change in medication DASH diet and commitment to daily physical activity for a minimum of 30 minutes discussed and encouraged, as a part of hypertension management. The importance of attaining a healthy weight is also discussed.  

## 2012-12-12 NOTE — Assessment & Plan Note (Signed)
Medication changed due to coverage by insurance

## 2012-12-12 NOTE — Assessment & Plan Note (Signed)
Controlled, no change in medication  

## 2012-12-14 NOTE — Telephone Encounter (Signed)
Patient aware and labs faxed to [facility].   She will have drawn today.

## 2012-12-21 ENCOUNTER — Telehealth: Payer: Self-pay | Admitting: Family Medicine

## 2012-12-21 NOTE — Telephone Encounter (Signed)
She is on certrizine and flonase so there are no other medications to call in, she can add benadryl OTC 25 mg, 1 tablet, at bedtime and see if this helps her allergy symptoms

## 2012-12-22 ENCOUNTER — Other Ambulatory Visit: Payer: Self-pay | Admitting: Family Medicine

## 2012-12-22 DIAGNOSIS — E785 Hyperlipidemia, unspecified: Secondary | ICD-10-CM | POA: Diagnosis not present

## 2012-12-22 DIAGNOSIS — I10 Essential (primary) hypertension: Secondary | ICD-10-CM | POA: Diagnosis not present

## 2012-12-22 DIAGNOSIS — Z113 Encounter for screening for infections with a predominantly sexual mode of transmission: Secondary | ICD-10-CM | POA: Diagnosis not present

## 2012-12-22 DIAGNOSIS — R7309 Other abnormal glucose: Secondary | ICD-10-CM | POA: Diagnosis not present

## 2012-12-22 LAB — LIPID PANEL
Cholesterol: 201 mg/dL — ABNORMAL HIGH (ref 0–200)
HDL: 48 mg/dL (ref >39)
Total CHOL/HDL Ratio: 4.2 Ratio
Triglycerides: 180 mg/dL — ABNORMAL HIGH (ref <150)
VLDL: 36 mg/dL (ref 0–40)

## 2012-12-23 ENCOUNTER — Other Ambulatory Visit: Payer: Self-pay | Admitting: Family Medicine

## 2012-12-23 NOTE — Telephone Encounter (Signed)
Patient aware.

## 2012-12-24 DIAGNOSIS — G894 Chronic pain syndrome: Secondary | ICD-10-CM | POA: Diagnosis not present

## 2012-12-24 DIAGNOSIS — Z9889 Other specified postprocedural states: Secondary | ICD-10-CM | POA: Diagnosis not present

## 2012-12-24 DIAGNOSIS — Z462 Encounter for fitting and adjustment of other devices related to nervous system and special senses: Secondary | ICD-10-CM | POA: Diagnosis not present

## 2012-12-24 DIAGNOSIS — M961 Postlaminectomy syndrome, not elsewhere classified: Secondary | ICD-10-CM | POA: Diagnosis not present

## 2012-12-25 ENCOUNTER — Other Ambulatory Visit: Payer: Self-pay

## 2012-12-25 MED ORDER — METFORMIN HCL ER 500 MG PO TB24: 500.0000 mg | ORAL_TABLET | Freq: Every day | ORAL | Status: DC

## 2013-01-07 ENCOUNTER — Other Ambulatory Visit: Payer: Self-pay | Admitting: Family Medicine

## 2013-01-28 DIAGNOSIS — G894 Chronic pain syndrome: Secondary | ICD-10-CM | POA: Diagnosis not present

## 2013-01-28 DIAGNOSIS — Z462 Encounter for fitting and adjustment of other devices related to nervous system and special senses: Secondary | ICD-10-CM | POA: Diagnosis not present

## 2013-01-28 DIAGNOSIS — M961 Postlaminectomy syndrome, not elsewhere classified: Secondary | ICD-10-CM | POA: Diagnosis not present

## 2013-02-02 ENCOUNTER — Other Ambulatory Visit: Payer: Self-pay

## 2013-02-02 MED ORDER — METFORMIN HCL ER 500 MG PO TB24: 500.0000 mg | ORAL_TABLET | Freq: Every day | ORAL | Status: DC

## 2013-02-07 ENCOUNTER — Emergency Department (HOSPITAL_COMMUNITY): Payer: Medicare Other

## 2013-02-07 ENCOUNTER — Emergency Department (HOSPITAL_COMMUNITY)
Admission: EM | Admit: 2013-02-07 | Discharge: 2013-02-07 | Disposition: A | Discharge status: Home / Self Care | Payer: Medicare Other | Attending: Emergency Medicine | Admitting: Emergency Medicine

## 2013-02-07 ENCOUNTER — Encounter (HOSPITAL_COMMUNITY): Payer: Self-pay | Admitting: Emergency Medicine

## 2013-02-07 DIAGNOSIS — Z79899 Other long term (current) drug therapy: Secondary | ICD-10-CM | POA: Insufficient documentation

## 2013-02-07 DIAGNOSIS — I10 Essential (primary) hypertension: Secondary | ICD-10-CM | POA: Diagnosis not present

## 2013-02-07 DIAGNOSIS — G8929 Other chronic pain: Secondary | ICD-10-CM | POA: Insufficient documentation

## 2013-02-07 DIAGNOSIS — E669 Obesity, unspecified: Secondary | ICD-10-CM | POA: Diagnosis not present

## 2013-02-07 DIAGNOSIS — Z8739 Personal history of other diseases of the musculoskeletal system and connective tissue: Secondary | ICD-10-CM | POA: Diagnosis not present

## 2013-02-07 DIAGNOSIS — H53149 Visual discomfort, unspecified: Secondary | ICD-10-CM | POA: Insufficient documentation

## 2013-02-07 DIAGNOSIS — E119 Type 2 diabetes mellitus without complications: Secondary | ICD-10-CM | POA: Insufficient documentation

## 2013-02-07 DIAGNOSIS — R011 Cardiac murmur, unspecified: Secondary | ICD-10-CM | POA: Insufficient documentation

## 2013-02-07 DIAGNOSIS — R404 Transient alteration of awareness: Secondary | ICD-10-CM | POA: Diagnosis not present

## 2013-02-07 DIAGNOSIS — Z8709 Personal history of other diseases of the respiratory system: Secondary | ICD-10-CM | POA: Insufficient documentation

## 2013-02-07 DIAGNOSIS — F3289 Other specified depressive episodes: Secondary | ICD-10-CM | POA: Insufficient documentation

## 2013-02-07 DIAGNOSIS — R51 Headache: Secondary | ICD-10-CM | POA: Diagnosis not present

## 2013-02-07 DIAGNOSIS — F329 Major depressive disorder, single episode, unspecified: Secondary | ICD-10-CM | POA: Insufficient documentation

## 2013-02-07 DIAGNOSIS — G43109 Migraine with aura, not intractable, without status migrainosus: Secondary | ICD-10-CM | POA: Diagnosis not present

## 2013-02-07 DIAGNOSIS — Z8679 Personal history of other diseases of the circulatory system: Secondary | ICD-10-CM | POA: Diagnosis not present

## 2013-02-07 DIAGNOSIS — J45909 Unspecified asthma, uncomplicated: Secondary | ICD-10-CM | POA: Diagnosis not present

## 2013-02-07 DIAGNOSIS — Z86718 Personal history of other venous thrombosis and embolism: Secondary | ICD-10-CM | POA: Diagnosis not present

## 2013-02-07 DIAGNOSIS — R42 Dizziness and giddiness: Secondary | ICD-10-CM | POA: Diagnosis not present

## 2013-02-07 HISTORY — DX: Type 2 diabetes mellitus without complications: E11.9

## 2013-02-07 LAB — BASIC METABOLIC PANEL
CO2: 26 mEq/L (ref 19–32)
Calcium: 9.8 mg/dL (ref 8.4–10.5)
Creatinine, Ser: 1.1 mg/dL (ref 0.50–1.10)
GFR calc Af Amer: 70 mL/min — ABNORMAL LOW (ref >90)

## 2013-02-07 LAB — CBC
MCH: 31 pg (ref 26.0–34.0)
MCV: 84.9 fL (ref 78.0–100.0)
Platelets: 292 10*3/uL (ref 150–400)
RDW: 14.1 % (ref 11.5–15.5)

## 2013-02-07 MED ORDER — POTASSIUM CHLORIDE 10 MEQ/100ML IV SOLN
10.0000 meq | Freq: Once | INTRAVENOUS | Status: AC
  Administered 2013-02-07: 10 meq via INTRAVENOUS
  Filled 2013-02-07: qty 100

## 2013-02-07 MED ORDER — SODIUM CHLORIDE 0.9 % IV BOLUS (SEPSIS)
1000.0000 mL | Freq: Once | INTRAVENOUS | Status: AC
  Administered 2013-02-07: 1000 mL via INTRAVENOUS

## 2013-02-07 MED ORDER — POTASSIUM CHLORIDE 10 MEQ/100ML IV SOLN: 10.0000 meq | INTRAVENOUS | Status: DC

## 2013-02-07 MED ORDER — ONDANSETRON HCL 4 MG/2ML IJ SOLN
4.0000 mg | Freq: Once | INTRAMUSCULAR | Status: AC
  Administered 2013-02-07: 4 mg via INTRAVENOUS
  Filled 2013-02-07: qty 2

## 2013-02-07 MED ORDER — ONDANSETRON 8 MG PO TBDP: 8.0000 mg | ORAL_TABLET | Freq: Three times a day (TID) | ORAL | Status: DC | PRN

## 2013-02-07 MED ORDER — MORPHINE SULFATE 4 MG/ML IJ SOLN
6.0000 mg | Freq: Once | INTRAMUSCULAR | Status: AC
  Administered 2013-02-07: 6 mg via INTRAVENOUS
  Filled 2013-02-07: qty 2

## 2013-02-07 MED ORDER — METOCLOPRAMIDE HCL 5 MG/ML IJ SOLN
10.0000 mg | Freq: Once | INTRAMUSCULAR | Status: AC
  Administered 2013-02-07: 10 mg via INTRAVENOUS
  Filled 2013-02-07: qty 2

## 2013-02-07 MED ORDER — POTASSIUM CHLORIDE CRYS ER 20 MEQ PO TBCR
40.0000 meq | EXTENDED_RELEASE_TABLET | Freq: Once | ORAL | Status: AC
  Administered 2013-02-07: 40 meq via ORAL
  Filled 2013-02-07: qty 2

## 2013-02-07 MED ORDER — KETOROLAC TROMETHAMINE 30 MG/ML IJ SOLN
30.0000 mg | Freq: Once | INTRAMUSCULAR | Status: AC
  Administered 2013-02-07: 30 mg via INTRAVENOUS
  Filled 2013-02-07: qty 1

## 2013-02-07 NOTE — ED Notes (Signed)
Per EMS - pt had sudden onset of HA, dizziness and blurred vision 1.5hr ago, but pt has been having HA on and off for days. Pt has had multiple neck and back sx. BP 119/83 HR 90 regular RR16 CBG 155. Last sx was in December and has been having intermittent HA since then. Pt denies CP/SOB. Pt denies trouble with bowels/urinating. Pt sts she hasn't taken any of her medications today.

## 2013-02-07 NOTE — ED Provider Notes (Signed)
History     CSN: [id number]  Arrival date & time 02/07/13  1408   First MD Initiated Contact with Patient 02/07/13 1421      Chief Complaint  Patient presents with  . Back Pain     The history is provided by the patient.   patient reports developing a headache with some dizziness and blurred vision this started about an hour and a half ago.  She's also been very nauseated at the time.  She's had intermittent headaches for the past several weeks.  She has photophobia and phonophobia.  She has a history of chronic back and neck pain with prior neck surgery.  She always has some discomfort and weakness of her left upper extremity she feels like this is about the same or maybe slightly worse.  Family reports no altered mental status or facial asymmetry.  No difficulty with her speech.  No prior history of stroke.  She does have a prior history of headaches.  No fevers or chills.  No neck pain.  No chest pain shortness of breath.  No abdominal pain.  She still feels slightly nauseated at this time  Past Medical History  Diagnosis Date  . Migraine headache   . Back pain   . Obesity   . Hypertension   . DVT (deep venous thrombosis) 2010  . Heart murmur     no cardiologist  . Asthma   . Bronchitis   . Shortness of breath     if having asthma attack  . Depression   . Diabetes mellitus without complication     Past Surgical History  Procedure Laterality Date  . Appendectomy    . Carpal tunnel release    . Tubal ligation    . Lumbar spine surgery  2010    x2  . Back surgery    . Laparoscopic lysis intestinal adhesions    . Finger surgery      right pinkie finger  . Anterior cervical decomp/discectomy fusion  07/07/2012    Procedure: ANTERIOR CERVICAL DECOMPRESSION/DISCECTOMY FUSION 2 LEVELS;  Surgeon: Karn Cassis, MD;  Location: [facility];  Service: Neurosurgery;  Laterality: N/A;  Cervical four-five, five - six  Anterior cervical decompression/diskectomy/fusion/plate     Family History  Problem Relation Age of Onset  . Lung cancer Father   . Stomach cancer Father   . Cancer Father   . Diabetes Sister   . Hypertension Sister   . Alcohol abuse Sister   . Diabetes Sister   . Mental illness Sister   . Hypertension Sister   . Alcohol abuse Brother   . Hypertension Brother   . Kidney disease Brother   . Diabetes Brother   . Alcohol abuse Brother   . Hypertension Brother   . Diabetes Brother   . Alcohol abuse Brother   . Hypertension Brother   . Diabetes Brother   . Alcohol abuse Brother     History  Substance Use Topics  . Smoking status: Never Smoker   . Smokeless tobacco: Not on file  . Alcohol Use: No    OB History   Grav Para Term Preterm Abortions TAB SAB Ect Mult Living                  Review of Systems  All other systems reviewed and are negative.    Allergies  Ibuprofen; Penicillins; and Vicodin  Home Medications   Current Outpatient Rx  Name  Route  Sig  Dispense  Refill  . acyclovir (ZOVIRAX) 800 MG tablet      take 1 tablet by mouth three times a day   90 tablet   2   . albuterol (PROVENTIL HFA;VENTOLIN HFA) 108 (90 BASE) MCG/ACT inhaler   Inhalation   Inhale 2 puffs into the lungs every 6 (six) hours as needed for wheezing.   1 Inhaler   3   . albuterol (PROVENTIL) (2.5 MG/3ML) 0.083% nebulizer solution   Nebulization   Take 3 mLs (2.5 mg total) by nebulization every 6 (six) hours as needed. Use one vial per nebulizer every 6 hours as needed for wheezing   75 mL   3   . ALPRAZolam (XANAX) 0.5 MG tablet   Oral   Take 1 tablet (0.5 mg total) by mouth 3 (three) times daily as needed for sleep or anxiety.   90 tablet   5   . amLODipine (NORVASC) 10 MG tablet   Oral   Take 1 tablet (10 mg total) by mouth daily.   90 tablet   3     Please dispense 90 day supply   . baclofen (LIORESAL) 10 MG tablet   Oral   Take 10 mg by mouth 3 (three) times daily.         . Biotin (BIOTIN MAXIMUM STRENGTH)  5 MG CAPS   Oral   Take 2 capsules by mouth daily.         . budesonide-formoterol (SYMBICORT) 160-4.5 MCG/ACT inhaler   Inhalation   Inhale 2 puffs into the lungs 2 (two) times daily.   1 Inhaler   12     Dose increase effective 02/05/2012   . cetirizine (ZYRTEC) 10 MG tablet   Oral   Take 10 mg by mouth daily.         . diazepam (VALIUM) 5 MG tablet   Oral   Take 5 mg by mouth every 6 (six) hours as needed. For anxiety         . fluticasone (FLONASE) 50 MCG/ACT nasal spray      instill 2 sprays into each nostril once daily   16 g   2   . gabapentin (NEURONTIN) 300 MG capsule   Oral   Take 300 mg by mouth 3 (three) times daily.         . metFORMIN (GLUCOPHAGE XR) 500 MG 24 hr tablet   Oral   Take 1 tablet (500 mg total) by mouth daily with breakfast.   30 tablet   3   . montelukast (SINGULAIR) 10 MG tablet   Oral   Take 1 tablet (10 mg total) by mouth at bedtime.   30 tablet   5   . Multiple Vitamin (MULTIVITAMIN WITH MINERALS) TABS   Oral   Take 1 tablet by mouth daily.         Marland Kitchen oxyCODONE (ROXICODONE) 15 MG immediate release tablet   Oral   Take 15 mg by mouth every 4 (four) hours as needed. For breakthrough pain         . pantoprazole (PROTONIX) 20 MG tablet      take 1 tablet by mouth once daily   30 tablet   3   . potassium chloride (KLOR-CON 10) 10 MEQ tablet      Discontinue liquid potassium effective 12/10/2012 Two tablets three times daily   180 tablet   5   . topiramate (TOPAMAX) 50 MG tablet   Oral   Take 50 mg by  mouth 2 (two) times daily.         Marland Kitchen triamterene-hydrochlorothiazide (MAXZIDE) 75-50 MG per tablet   Oral   Take 1 tablet by mouth daily.   90 tablet   3     Dose increase effective 04/27/2012     BP 125/76  Pulse 93  Resp 20  SpO2 100%  LMP 01/24/2013  Physical Exam  Nursing note and vitals reviewed. Constitutional: She is oriented to person, place, and time. She appears well-developed and  well-nourished. No distress.  HENT:  Head: Normocephalic and atraumatic.  Eyes: EOM are normal.  Neck: Normal range of motion.  Cardiovascular: Normal rate, regular rhythm and normal heart sounds.   Pulmonary/Chest: Effort normal and breath sounds normal.  Abdominal: Soft. She exhibits no distension. There is no tenderness.  Musculoskeletal: Normal range of motion.  Neurological: She is alert and oriented to person, place, and time.  5 out of 5 strength in major muscle groups of the right upper and right lower extremity.  5 minus out of 5 strength of the major muscle groups of her left upper and left lower extremities.  Speech normal.  No facial asymmetry.  No altered mental status  Skin: Skin is warm and dry.  Psychiatric: She has a normal mood and affect. Judgment normal.    ED Course  Procedures (including critical care time)  Labs Reviewed  CBC - Abnormal; Notable for the following:    MCHC 36.6 (*)    All other components within normal limits  BASIC METABOLIC PANEL - Abnormal; Notable for the following:    Potassium 2.2 (*)    Glucose, Bld 106 (*)    GFR calc non Af Amer 61 (*)    GFR calc Af Amer 70 (*)    All other components within normal limits   Ct Head Wo Contrast  02/07/2013  *RADIOLOGY REPORT*  Clinical Data: Back pain, headache  CT HEAD WITHOUT CONTRAST  Technique:  Contiguous axial images were obtained from the base of the skull through the vertex without contrast.  Comparison: 12/11/2012  Findings: The brain has a normal appearance without evidence for hemorrhage, infarction, hydrocephalus, or mass lesion.  There is no extra axial fluid collection.  The skull and paranasal sinuses are normal.  IMPRESSION:  Normal exam.   Original Report Authenticated By: Signa Kell, M.D.   I personally reviewed the imaging tests through PACS system I reviewed available ER/hospitalization records through the EMR   1. Complicated migraine       MDM  6:03 PM Patient feels much  better at this time.  No neurologic deficit.  CT head normal.  My suspicion for stroke is very low.  I think this is a complicated migraine headache.  She's had headache intermittently for the past several days.  She does have some ongoing pain in her left arm and weakness in her left arm since her spine surgery.  I suspect this is not significantly changed.  Given her rapid improvement along with her headache but the patient stable for discharge in good condition.  Home with PCP followup.        Lyanne Co, MD 02/07/13 [phone]

## 2013-02-09 ENCOUNTER — Ambulatory Visit (INDEPENDENT_AMBULATORY_CARE_PROVIDER_SITE_OTHER): Payer: Managed Care, Other (non HMO) | Admitting: Family Medicine

## 2013-02-09 ENCOUNTER — Other Ambulatory Visit: Payer: Self-pay | Admitting: Family Medicine

## 2013-02-09 ENCOUNTER — Encounter: Payer: Self-pay | Admitting: Family Medicine

## 2013-02-09 VITALS — BP 108/68 | HR 88 | Resp 16 | Wt 176.0 lb

## 2013-02-09 DIAGNOSIS — G43909 Migraine, unspecified, not intractable, without status migrainosus: Secondary | ICD-10-CM | POA: Diagnosis not present

## 2013-02-09 DIAGNOSIS — R110 Nausea: Secondary | ICD-10-CM

## 2013-02-09 DIAGNOSIS — E876 Hypokalemia: Secondary | ICD-10-CM

## 2013-02-09 DIAGNOSIS — I10 Essential (primary) hypertension: Secondary | ICD-10-CM

## 2013-02-09 DIAGNOSIS — F32A Depression, unspecified: Secondary | ICD-10-CM

## 2013-02-09 DIAGNOSIS — F329 Major depressive disorder, single episode, unspecified: Secondary | ICD-10-CM | POA: Diagnosis not present

## 2013-02-09 DIAGNOSIS — J309 Allergic rhinitis, unspecified: Secondary | ICD-10-CM

## 2013-02-09 DIAGNOSIS — J45909 Unspecified asthma, uncomplicated: Secondary | ICD-10-CM

## 2013-02-09 DIAGNOSIS — F411 Generalized anxiety disorder: Secondary | ICD-10-CM

## 2013-02-09 MED ORDER — PROMETHAZINE HCL 12.5 MG PO TABS: ORAL_TABLET | ORAL | Status: DC

## 2013-02-09 MED ORDER — RIZATRIPTAN BENZOATE 10 MG PO TBDP: 10.0000 mg | ORAL_TABLET | ORAL | Status: DC | PRN

## 2013-02-09 MED ORDER — POTASSIUM CHLORIDE CRYS ER 10 MEQ PO TBCR: EXTENDED_RELEASE_TABLET | ORAL | Status: DC

## 2013-02-09 MED ORDER — TOPIRAMATE 100 MG PO TABS: 100.0000 mg | ORAL_TABLET | Freq: Two times a day (BID) | ORAL | Status: DC

## 2013-02-09 NOTE — Patient Instructions (Addendum)
F/u in 6 weeks  Liquid potassium is 15ml three times daily, tablets will be two tabs three times daily  Chem 7 in 1 week, next Tuesday, as a stat in the morning please, then rept first week in May  Low potassium is dangerous , if too low , heart can stop   Dose increase on topamax to 100mg  twice daily, OK to take two50 mg tabs twice daily till done.  Maxalt is sent for headache , use only as directed, restrict use , if headache goes away after 1 tab do not rept the dose. Ten tabs expected to last 30 days.  You are referred to therapist   Fluoxetine is to be stopped since you are going to start the maxalt

## 2013-02-09 NOTE — Progress Notes (Signed)
  Subjective:    Patient ID: Jane English, female    DOB: 1971/02/21, 42 y.o.   MRN: [id number]  HPI Pt in for Ed follow up with her fiancee. Reports 'passing out' at [facility] requiring Ed visit. Only significant positive was low potassium, head scan normal. Still c/o uncontrolled headaches, neurologist she has seen sending her for another opinion reportedly Pt states maxalt helped in the past and wants this so I advise d/c fluoxetine which is doing very little for her mood and depression. Partner states she is always irritable with a bad attitude, things have gone downhill since her neck surgery, always c/o pain and numbess in neck , arms c/o area where nerve stimulator was  Placed. Unfortunately , in the past when [place] had  back surgery, her response was even more negatively extreme at the time, so this is not out of character, and she herself acknowledges this. She has been repeatedly re evaluated by the neurosurgeon with no new significant pathology apparent, and good surgical result.She is agreeable to going for counseling. Asks about possibility of blood clot in her right leg where she has a visibly dilated superficial vein , i reassure her of the benign nature of this   Review of Systems See HPI Denies recent fever or chills. Denies sinus pressure, nasal congestion, ear pain or sore throat. Denies chest congestion, productive cough or wheezing. Denies chest pains, palpitations and leg swelling Denies abdominal pain, nausea, vomiting,diarrhea or constipation.   Denies dysuria, frequency, hesitancy or incontinence.  Denies skin break down or rash.        Objective:   Physical Exam  Patient alert and oriented and in no cardiopulmonary distress.Anxious and irritable  HEENT: No facial asymmetry, EOMI, no sinus tenderness,  oropharynx pink and moist.  Neck adequate ROM no adenopathy.  Chest: Clear to auscultation bilaterally.  CVS: S1, S2 no murmurs, no S3.  ABD: Soft non tender.  Bowel sounds normal.  Ext: No edema  MS: Adequate ROM spine, shoulders, hips and knees.  Skin: Intact, no ulcerations or rash noted.  Psych: Good eye contact, . Memory intact  anxious and  depressed appearing.  CNS: CN 2-12 intact, power, tone and sensation normal throughout.       Assessment & Plan:

## 2013-02-10 ENCOUNTER — Telehealth: Payer: Self-pay | Admitting: Family Medicine

## 2013-02-10 NOTE — Telephone Encounter (Signed)
left message that Behavioral Health will call with an appointment if she has not heard anything in a week to call back and I will check on this.

## 2013-02-13 DIAGNOSIS — E876 Hypokalemia: Secondary | ICD-10-CM | POA: Insufficient documentation

## 2013-02-13 NOTE — Assessment & Plan Note (Signed)
Controlled, no change in medication  

## 2013-02-13 NOTE — Assessment & Plan Note (Signed)
increased an uncontrolled refer therapy

## 2013-02-13 NOTE — Assessment & Plan Note (Signed)
Increased symptoms with season change, daily meds to be used as directed

## 2013-02-13 NOTE — Assessment & Plan Note (Signed)
Importance of remaining on supplemental potassium stressed and blood levels will be monitored

## 2013-02-13 NOTE — Assessment & Plan Note (Signed)
Uncontrolled , s/p neck surgery, not suicidal or homicidal, no real benefit from the fluoxetine, needs therapy and is agreeable

## 2013-02-13 NOTE — Assessment & Plan Note (Signed)
Uncontrolled, increase dose of topamax and add maxalt which has helped in the past

## 2013-02-15 ENCOUNTER — Other Ambulatory Visit: Payer: Self-pay | Admitting: Family Medicine

## 2013-02-15 ENCOUNTER — Other Ambulatory Visit: Payer: Self-pay

## 2013-02-15 DIAGNOSIS — J45909 Unspecified asthma, uncomplicated: Secondary | ICD-10-CM

## 2013-02-15 MED ORDER — FLUTICASONE PROPIONATE 50 MCG/ACT NA SUSP: 2.0000 | Freq: Every day | NASAL | Status: DC

## 2013-02-15 MED ORDER — BUDESONIDE-FORMOTEROL FUMARATE 160-4.5 MCG/ACT IN AERO: 2.0000 | INHALATION_SPRAY | Freq: Two times a day (BID) | RESPIRATORY_TRACT | Status: DC

## 2013-02-15 MED ORDER — ALBUTEROL SULFATE HFA 108 (90 BASE) MCG/ACT IN AERS: 2.0000 | INHALATION_SPRAY | Freq: Four times a day (QID) | RESPIRATORY_TRACT | Status: DC | PRN

## 2013-02-16 DIAGNOSIS — I10 Essential (primary) hypertension: Secondary | ICD-10-CM | POA: Diagnosis not present

## 2013-02-16 LAB — BASIC METABOLIC PANEL
Calcium: 9.4 mg/dL (ref 8.4–10.5)
Potassium: 3.7 mEq/L (ref 3.5–5.3)
Sodium: 137 mEq/L (ref 135–145)

## 2013-02-17 ENCOUNTER — Telehealth: Payer: Self-pay | Admitting: Family Medicine

## 2013-02-17 NOTE — Telephone Encounter (Signed)
Patient is aware 

## 2013-02-25 ENCOUNTER — Telehealth: Payer: Self-pay | Admitting: Family Medicine

## 2013-02-25 ENCOUNTER — Ambulatory Visit (HOSPITAL_COMMUNITY)
Admission: RE | Admit: 2013-02-25 | Discharge: 2013-02-25 | Disposition: A | Discharge status: Home / Self Care | Payer: Medicare Other | Source: Ambulatory Visit | Attending: Family Medicine | Admitting: Family Medicine

## 2013-02-25 DIAGNOSIS — M7989 Other specified soft tissue disorders: Secondary | ICD-10-CM | POA: Insufficient documentation

## 2013-02-25 NOTE — Telephone Encounter (Signed)
Patient aware.

## 2013-02-25 NOTE — Telephone Encounter (Signed)
Okay to send for ultrasound leg to rule out DVT, orders placed

## 2013-02-25 NOTE — Telephone Encounter (Signed)
Spoke with patient and she states that she has a painful area on the back of her right leg.  The area is red and warm to touch.  Painful x 24 hours.  States that she was told in last hospitalization that she may have a dvt in right leg but nothing was documented.  Please advise.

## 2013-02-26 ENCOUNTER — Ambulatory Visit (INDEPENDENT_AMBULATORY_CARE_PROVIDER_SITE_OTHER): Payer: Medicare Other | Admitting: Family Medicine

## 2013-02-26 VITALS — BP 112/80 | HR 84 | Resp 16 | Wt 168.0 lb

## 2013-02-26 DIAGNOSIS — R233 Spontaneous ecchymoses: Secondary | ICD-10-CM

## 2013-02-26 DIAGNOSIS — R634 Abnormal weight loss: Secondary | ICD-10-CM | POA: Diagnosis not present

## 2013-02-26 DIAGNOSIS — I809 Phlebitis and thrombophlebitis of unspecified site: Secondary | ICD-10-CM | POA: Diagnosis not present

## 2013-02-26 MED ORDER — NAPROXEN 375 MG PO TABS: 375.0000 mg | ORAL_TABLET | Freq: Two times a day (BID) | ORAL | Status: DC

## 2013-02-26 NOTE — Patient Instructions (Signed)
Take the low dose anti-inflammatory Labs to be done today for the weight loss and potassium No antibiotics needed Phlebitis Phlebitis is a redness, tenderness and soreness (inflammation) in a vein. This can occur in your arms, legs, or torso (trunk), as well as deeper inside your body.  CAUSES  Phlebitis can be triggered by multiple factors. These include:  Reduced (restricted) blood flow through your veins. This happens with prolonged bed rest, long distance travel, injury or surgery. Being overweight (obese) and pregnant can also restrict blood flow and lead to phlebitis.  Putting a catheter in the vein (intravenous or IV) and giving certain medications through in the vein (intravenously).  Cancer and cancer treatment.  Use of illegal intravenous drugs.  Inflammatory diseases.  Inherited (genetic) diseases that increase the risk for blood clots.  Hormone therapy (such as birth control pills). SYMPTOMS   Red, tender, swollen, painful area on your skin.  Usually, the area will be long and narrow.  Low grade fever.  Significant firmness along the center of this area. This can indicate that a blood clot has formed.  Surrounding redness or a high fever, which can indicate an infection (cellulitis). DIAGNOSIS   The appearance of your condition and your symptoms will cause your caregiver to suspect phlebitis. Usually, this is enough for a diagnosis.  Your caregiver may request blood tests or an ultrasound test of the area to be sure you do not have an infection or a blood clot. Blood tests and discussing your family history may also indicate if you have an underlying genetic disease that causes blood clots.  Occasionally, a piece of tissue is taken from the body (biopsy) if an unusual cause of phlebitis is suspected. TREATMENT   Raise (elevate) the affected area above the level of the heart.  Apply a warm compress or heating pad for 20 minutes, 3 or 4 times a day. If you use an  electric heating pad, follow the directions so you do not burn yourself.  Anti-inflammatory medications are usually recommended. Follow your caregiver's directions.  Any IV catheter, if present, will be removed by your caregiver.  Your caregiver may prescribe medicines that kill germs (antibiotics) if an infection is present.  Your caregiver may recommend blood thinners if a blood clot is suspected or present.  Support stockings or bandages may be helpful, depending on the cause and location of the phlebitis.  Surgery may be needed to remove very damaged sections of vein, but this is rare. HOME CARE INSTRUCTIONS   Take medications exactly as prescribed.  Follow up with your caregiver as directed.  Use support stockings or bandages if advised. These will speed healing and prevent recurrence.  If you are on blood thinners:  Do follow-up blood tests exactly as directed.  Check with your caregiver before using any new medications.  Wear a pendant to show that you are on blood thinners.  For phlebitis in the legs:  Avoid prolonged standing or bed rest.  Keep your legs moving. Raise your legs with sitting or lying.  Do not smoke.  Women, particularly those over the age of 57, should consider the risks and benefits of taking the contraceptive pill. This kind of hormone treatment can increase your risk for blood clots. SEEK MEDICAL CARE IF:   You have unusual bruising or any bleeding problems.  Swelling or pain in your affected arm or leg is not gradually improving.  You are on anti-inflammatory medication and you develop belly (abdominal) pain. SEEK IMMEDIATE  MEDICAL CARE IF:   An unexplained oral temperature above 100.5 F (38.1 C) develops.  You have sudden onset of chest pain or difficulty breathing. Document Released: 10/15/2001 Document Revised: 01/13/2012 Document Reviewed: 07/17/2009 [facility] Patient Information 2013 [place], [place].

## 2013-02-27 LAB — CBC WITH DIFFERENTIAL/PLATELET
Basophils Absolute: 0 10*3/uL (ref 0.0–0.1)
Eosinophils Absolute: 0.3 10*3/uL (ref 0.0–0.7)
Eosinophils Relative: 4 % (ref 0–5)
Lymphocytes Relative: 29 % (ref 12–46)
MCV: 87.2 fL (ref 78.0–100.0)
Neutrophils Relative %: 60 % (ref 43–77)
Platelets: 335 10*3/uL (ref 150–400)
RDW: 14.3 % (ref 11.5–15.5)
WBC: 6.9 10*3/uL (ref 4.0–10.5)

## 2013-02-27 LAB — TSH: TSH: 0.769 u[IU]/mL (ref 0.350–4.500)

## 2013-02-27 LAB — COMPREHENSIVE METABOLIC PANEL
ALT: 13 U/L (ref 0–35)
Albumin: 4.4 g/dL (ref 3.5–5.2)
CO2: 26 mEq/L (ref 19–32)
Calcium: 9.5 mg/dL (ref 8.4–10.5)
Chloride: 103 mEq/L (ref 96–112)
Glucose, Bld: 117 mg/dL — ABNORMAL HIGH (ref 70–99)
Potassium: 3.4 mEq/L — ABNORMAL LOW (ref 3.5–5.3)
Sodium: 138 mEq/L (ref 135–145)
Total Bilirubin: 0.5 mg/dL (ref 0.3–1.2)
Total Protein: 7.6 g/dL (ref 6.0–8.3)

## 2013-02-27 LAB — PROTIME-INR: INR: 1.03 (ref <1.50)

## 2013-02-28 ENCOUNTER — Encounter: Payer: Self-pay | Admitting: Family Medicine

## 2013-02-28 DIAGNOSIS — I809 Phlebitis and thrombophlebitis of unspecified site: Secondary | ICD-10-CM | POA: Insufficient documentation

## 2013-02-28 DIAGNOSIS — R634 Abnormal weight loss: Secondary | ICD-10-CM | POA: Insufficient documentation

## 2013-02-28 NOTE — Assessment & Plan Note (Addendum)
20lb weight loss, will add TSH, CBC with BMET pending, Mammogram UTD Non smoker Will have PCP complete weight loss work-up, this could be a combination of metformin and topamax, I reviewed chart later, metformin started 2 months ago, when weight loss, started

## 2013-02-28 NOTE — Progress Notes (Signed)
  Subjective:    Patient ID: Jane English, female    DOB: Jul 13, 1971, 42 y.o.   MRN: [id number]  HPI  Pt here with right calf swelling and pain. Had soreness in this spot, seen by PCP recently no infectious or swelling seen just varicose veins. Tuesday took a picture ( showed me on phone) had erythema with a semicircle of bruising and swelling, no open lesions. Was using ICE this helped the swelling, painful to walk on at times. Her pain meds are not helping. Sent for Ultrasound yesterday neg for DVT or superficial thrombus At end of visit she stated she was Concerned she is loosing weight, no new meds per pt, no other infectious, no increase in activity or exercise, no dieting  Review of Systems - per above   GEN- denies fatigue, fever, weight loss,weakness, recent illness HEENT- denies eye drainage, change in vision, nasal discharge, CVS- denies chest pain, palpitations RESP- denies SOB, cough, wheeze MSK- denies joint pain,+ muscle aches, injury Neuro- denies headache, dizziness, syncope, seizure activity      Objective:   Physical Exam  GEN- NAD, alert and oriented x3 EXT- Right lower ext- mild swelling at calf around varicose veins, with mild purplish bruising in semi-circular fashion, +TTP, no cord felt, no induration, no warmth, no pustules, left calf NT, no swelling No peripheral edema Pulses- Radial, DP- 2+       Assessment & Plan:

## 2013-02-28 NOTE — Assessment & Plan Note (Signed)
Treat with low dose Naprosyn, she does take advil, told her blood was thin with ibuprofen after a surgery, has not had any bleeding Warm compresses, already improving No DVT or superficial thrombis, No signs of cellulitis

## 2013-03-01 ENCOUNTER — Ambulatory Visit (INDEPENDENT_AMBULATORY_CARE_PROVIDER_SITE_OTHER): Payer: Managed Care, Other (non HMO) | Admitting: Psychiatry

## 2013-03-01 ENCOUNTER — Encounter (HOSPITAL_COMMUNITY): Payer: Self-pay | Admitting: Psychiatry

## 2013-03-01 DIAGNOSIS — F39 Unspecified mood [affective] disorder: Secondary | ICD-10-CM | POA: Diagnosis not present

## 2013-03-02 ENCOUNTER — Other Ambulatory Visit: Payer: Self-pay

## 2013-03-02 ENCOUNTER — Encounter (HOSPITAL_COMMUNITY): Payer: Self-pay | Admitting: Psychiatry

## 2013-03-02 MED ORDER — METFORMIN HCL ER 500 MG PO TB24: 500.0000 mg | ORAL_TABLET | Freq: Every day | ORAL | Status: DC

## 2013-03-02 NOTE — Patient Instructions (Signed)
Discussed orally 
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
## 2013-03-03 ENCOUNTER — Other Ambulatory Visit: Payer: Self-pay | Admitting: Family Medicine

## 2013-03-03 DIAGNOSIS — R634 Abnormal weight loss: Secondary | ICD-10-CM

## 2013-03-03 DIAGNOSIS — R109 Unspecified abdominal pain: Secondary | ICD-10-CM

## 2013-03-04 ENCOUNTER — Encounter: Payer: Self-pay | Admitting: Internal Medicine

## 2013-03-04 ENCOUNTER — Telehealth: Payer: Self-pay | Admitting: Family Medicine

## 2013-03-05 DIAGNOSIS — Z9889 Other specified postprocedural states: Secondary | ICD-10-CM | POA: Diagnosis not present

## 2013-03-05 DIAGNOSIS — G894 Chronic pain syndrome: Secondary | ICD-10-CM | POA: Diagnosis not present

## 2013-03-05 DIAGNOSIS — M961 Postlaminectomy syndrome, not elsewhere classified: Secondary | ICD-10-CM | POA: Diagnosis not present

## 2013-03-05 DIAGNOSIS — Z462 Encounter for fitting and adjustment of other devices related to nervous system and special senses: Secondary | ICD-10-CM | POA: Diagnosis not present

## 2013-03-05 NOTE — Telephone Encounter (Signed)
Patient is aware 

## 2013-03-11 ENCOUNTER — Encounter: Payer: Self-pay | Admitting: Family Medicine

## 2013-03-11 ENCOUNTER — Ambulatory Visit (INDEPENDENT_AMBULATORY_CARE_PROVIDER_SITE_OTHER): Payer: Managed Care, Other (non HMO) | Admitting: Family Medicine

## 2013-03-11 VITALS — BP 120/84 | HR 94 | Resp 16 | Wt 176.0 lb

## 2013-03-11 DIAGNOSIS — H01139 Eczematous dermatitis of unspecified eye, unspecified eyelid: Secondary | ICD-10-CM | POA: Diagnosis not present

## 2013-03-11 DIAGNOSIS — F411 Generalized anxiety disorder: Secondary | ICD-10-CM

## 2013-03-11 DIAGNOSIS — R220 Localized swelling, mass and lump, head: Secondary | ICD-10-CM | POA: Diagnosis not present

## 2013-03-11 DIAGNOSIS — J302 Other seasonal allergic rhinitis: Secondary | ICD-10-CM

## 2013-03-11 DIAGNOSIS — R21 Rash and other nonspecific skin eruption: Secondary | ICD-10-CM

## 2013-03-11 DIAGNOSIS — R7309 Other abnormal glucose: Secondary | ICD-10-CM

## 2013-03-11 DIAGNOSIS — J309 Allergic rhinitis, unspecified: Secondary | ICD-10-CM | POA: Diagnosis not present

## 2013-03-11 DIAGNOSIS — R7302 Impaired glucose tolerance (oral): Secondary | ICD-10-CM

## 2013-03-11 DIAGNOSIS — R634 Abnormal weight loss: Secondary | ICD-10-CM

## 2013-03-11 DIAGNOSIS — R221 Localized swelling, mass and lump, neck: Secondary | ICD-10-CM | POA: Diagnosis not present

## 2013-03-11 DIAGNOSIS — H01133 Eczematous dermatitis of right eye, unspecified eyelid: Secondary | ICD-10-CM

## 2013-03-11 DIAGNOSIS — I809 Phlebitis and thrombophlebitis of unspecified site: Secondary | ICD-10-CM

## 2013-03-11 DIAGNOSIS — I10 Essential (primary) hypertension: Secondary | ICD-10-CM

## 2013-03-11 MED ORDER — AZITHROMYCIN 250 MG PO TABS: ORAL_TABLET | ORAL | Status: AC

## 2013-03-11 MED ORDER — CETIRIZINE HCL 10 MG PO TBDP: 1.0000 | ORAL_TABLET | Freq: Every day | ORAL | Status: DC

## 2013-03-11 MED ORDER — FLUTICASONE PROPIONATE 50 MCG/ACT NA SUSP: 2.0000 | Freq: Every day | NASAL | Status: DC

## 2013-03-11 MED ORDER — MONTELUKAST SODIUM 10 MG PO TABS: 10.0000 mg | ORAL_TABLET | Freq: Every day | ORAL | Status: DC

## 2013-03-11 NOTE — Progress Notes (Signed)
  Subjective:    Patient ID: Jane English, female    DOB: 10-07-1971, 42 y.o.   MRN: [id number]  HPI The PT is here for follow up and re-evaluation of chronic medical conditions, medication management and review of any available recent lab and radiology data.  Preventive health is updated, specifically  Cancer screening and Immunization.   Was evaluated in the office approx 2 weeks ago for red area of unknown etiology on right leg, this improved with anti inflammatories, no cause , certainly non infectious identified. Pt had called in between visits , with concerns of 20 pound weight loss in an approx 6 month period, concerned about this, and has been referred for GI evaluation. Met with therapist yesterday and is extremely pleased with the visit, has appt in am again which she is looking forward to  C/o lip swelling and rash on upper lip x 2 days, states rah first appeared when she ate from a restaurant and believes may be due to product in the sauce     Review of Systems See HPI Denies recent fever or chills. Denies sinus pressure, nasal congestion, ear pain or sore throat. Denies chest congestion, productive cough or wheezing. Denies chest pains, palpitations and leg swelling Denies abdominal pain, nausea, vomiting,diarrhea or constipation.   Denies dysuria, frequency, hesitancy or incontinence. Chronic neck and back pain, no mention made of this this vist. Psychotherapy will benefit pt greatly Denies headaches, seizures, numbness, or tingling. Denies depression, anxiety or insomnia. C/o hypopigmentation of skin on face in patches and spreading, uses no products on her skin        Objective:   Physical Exam Patient alert and oriented and in no cardiopulmonary distress.  HEENT: No facial asymmetry, EOMI, no sinus tenderness,  oropharynx pink and moist.  Neck supple no adenopathy.  Chest: Clear to auscultation bilaterally.  CVS: S1, S2 no murmurs, no S3.  ABD: Soft non  tender. Bowel sounds normal.  Ext: No edema  MS: Adequate ROM spine, shoulders, hips and knees.  Skin: rash and mild swelling of right upper lip, hypopigmented macular rash on cheeks right greater than left. No residual erythema on right leg  Psych: Good eye contact, normal affect. Memory intact not anxious or depressed appearing.  CNS: CN 2-12 intact, power, tone and sensation normal throughout.        Assessment & Plan:

## 2013-03-11 NOTE — Patient Instructions (Addendum)
F/u in 8 weeks   Zithromax is sent in for sore on her upper lip   You are referred to dermatology for skin rash  HBA1C in 2 month

## 2013-03-12 ENCOUNTER — Ambulatory Visit (INDEPENDENT_AMBULATORY_CARE_PROVIDER_SITE_OTHER): Payer: Medicare Other | Admitting: Psychiatry

## 2013-03-12 ENCOUNTER — Ambulatory Visit: Payer: Medicare Other | Admitting: Family Medicine

## 2013-03-12 DIAGNOSIS — F319 Bipolar disorder, unspecified: Secondary | ICD-10-CM

## 2013-03-14 NOTE — Assessment & Plan Note (Signed)
Resolved

## 2013-03-14 NOTE — Assessment & Plan Note (Signed)
Has been referred to GI for further eval

## 2013-03-14 NOTE — Assessment & Plan Note (Signed)
Antibiotic prescribe d due to bacteril superinfection

## 2013-03-14 NOTE — Assessment & Plan Note (Signed)
Increased symptoms advised use of allergy meds prescribed

## 2013-03-14 NOTE — Assessment & Plan Note (Signed)
Already showing improvement following brief meeting with therapist, I am very encouraged about this

## 2013-03-14 NOTE — Assessment & Plan Note (Signed)
Dermatology to eval and treat facial rash which is worsening

## 2013-03-14 NOTE — Assessment & Plan Note (Signed)
Controlled, no change in medication DASH diet and commitment to daily physical activity for a minimum of 30 minutes discussed and encouraged, as a part of hypertension management. The importance of attaining a healthy weight is also discussed.  

## 2013-03-14 NOTE — Assessment & Plan Note (Signed)
Weight loss with metformin, will rept HBA1C next visit Patient educated about the importance of limiting  Carbohydrate intake , the need to commit to daily physical activity for a minimum of 30 minutes , and to commit weight loss. The fact that changes in all these areas will reduce or eliminate all together the development of diabetes is stressed.

## 2013-03-18 ENCOUNTER — Telehealth: Payer: Self-pay | Admitting: Family Medicine

## 2013-03-18 DIAGNOSIS — R51 Headache: Secondary | ICD-10-CM | POA: Diagnosis not present

## 2013-03-18 DIAGNOSIS — R519 Headache, unspecified: Secondary | ICD-10-CM | POA: Insufficient documentation

## 2013-03-18 DIAGNOSIS — G4700 Insomnia, unspecified: Secondary | ICD-10-CM | POA: Insufficient documentation

## 2013-03-18 DIAGNOSIS — G43709 Chronic migraine without aura, not intractable, without status migrainosus: Secondary | ICD-10-CM | POA: Diagnosis not present

## 2013-03-18 DIAGNOSIS — G43719 Chronic migraine without aura, intractable, without status migrainosus: Secondary | ICD-10-CM | POA: Insufficient documentation

## 2013-03-18 NOTE — Patient Instructions (Signed)
Discussed orally 
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
## 2013-03-19 ENCOUNTER — Encounter: Payer: Self-pay | Admitting: Internal Medicine

## 2013-03-22 ENCOUNTER — Encounter (HOSPITAL_COMMUNITY): Payer: Self-pay | Admitting: Psychiatry

## 2013-03-22 ENCOUNTER — Ambulatory Visit (INDEPENDENT_AMBULATORY_CARE_PROVIDER_SITE_OTHER): Payer: Medicare Other | Admitting: Psychiatry

## 2013-03-22 VITALS — BP 144/86 | Ht 65.25 in | Wt 171.6 lb

## 2013-03-22 DIAGNOSIS — G969 Disorder of central nervous system, unspecified: Secondary | ICD-10-CM | POA: Insufficient documentation

## 2013-03-22 DIAGNOSIS — F4310 Post-traumatic stress disorder, unspecified: Secondary | ICD-10-CM | POA: Diagnosis not present

## 2013-03-22 DIAGNOSIS — F411 Generalized anxiety disorder: Secondary | ICD-10-CM | POA: Diagnosis not present

## 2013-03-22 DIAGNOSIS — F329 Major depressive disorder, single episode, unspecified: Secondary | ICD-10-CM

## 2013-03-22 DIAGNOSIS — F3289 Other specified depressive episodes: Secondary | ICD-10-CM

## 2013-03-22 DIAGNOSIS — F3130 Bipolar disorder, current episode depressed, mild or moderate severity, unspecified: Secondary | ICD-10-CM | POA: Diagnosis not present

## 2013-03-22 MED ORDER — CARBAMAZEPINE ER 100 MG PO CP12: ORAL_CAPSULE | ORAL | Status: DC

## 2013-03-22 NOTE — Progress Notes (Signed)
Psychiatric Assessment Adult [phone]  Patient Identification:  [name] Date of Evaluation:  03/22/2013 Start Time: 12:00 PM End Time: 1:15 PM  Chief Complaint: "I need something to get my temper under control". Chief Complaint  Patient presents with  . Depression  . Establish Care  . Medication Refill   History of Chief Complaint:   Ever since her childhood she has struggled with anger outbursts and mood swings.  Pt demands that she not be put on anything that will cause her to swell up or have any reactions.  She has a very strong family history of lots of psychiatric problems.  She has been on Seroquel with very untoward effects.  She has been on Neurontin also with sleep walking.  She was also on Lithium.  HPI Review of Systems  Constitutional:       Stays cold because she is anemic  HENT: Positive for congestion, trouble swallowing, neck stiffness and sinus pressure.   Eyes:       Elevated pressure in the L eye and wears corrective lenses.  Respiratory: Positive for apnea, choking and chest tightness.        Has asthma  Cardiovascular: Negative.   Gastrointestinal: Negative.   Genitourinary: Negative.   Musculoskeletal: Positive for back pain and arthralgias.  Neurological: Positive for dizziness, tremors, facial asymmetry, speech difficulty, weakness, light-headedness, numbness and headaches.       Has had neck and back surgery, numbness in her (R) arm, losses track of time a lot of times.  Reviewed with her the symptoms of Temporal Lobe Problems of emotional instability, memory problems, feelings of panic, aggression, headaches, and learning problems.  She has every one of these symptoms.  She also smells and tastes things differently at times of her anger moments.  Furthermore her handwriting is different and her speech is different with the anger episodes.   Psychiatric/Behavioral: Positive for hallucinations, behavioral problems, confusion, sleep disturbance, dysphoric  mood, decreased concentration and agitation. Negative for suicidal ideas and self-injury. The patient is nervous/anxious and is hyperactive.    Physical Exam  Depressive Symptoms: depressed mood, anxiety,  (Hypo) Manic Symptoms:   Elevated Mood:  Yes Irritable Mood:  Yes Grandiosity:  No Distractibility:  Yes Labiality of Mood:  Yes Delusions:  Yes Hallucinations:  Yes, metal and bad tastes in her mouth associated with her very irritable moods. Yes Impulsivity:  Yes Sexually Inappropriate Behavior:  No Financial Extravagance:  No Flight of Ideas:  Yes  Anxiety Symptoms: Excessive Worry:  Yes Panic Symptoms:  Yes Agoraphobia:  No Obsessive Compulsive: Yes  Symptoms: Counting, Specific Phobias:  No Social Anxiety:  No  Psychotic Symptoms:  Hallucinations: Yes Gustatory Olfactory Delusions:  No Paranoia:  No   Ideas of Reference:  Yes  PTSD Symptoms: Ever had a traumatic exposure:  Yes Had a traumatic exposure in the last month:  Yes Re-experiencing: Yes Flashbacks Intrusive Thoughts Hypervigilance:  Yes Hyperarousal: Yes Difficulty Concentrating Emotional Numbness/Detachment Increased Startle Response Avoidance: Yes Decreased Interest/Participation Foreshortened Future  Traumatic Brain Injury: Yes   Past Psychiatric History: Diagnosis: Bipolar Disorder, Depressed, Anxiety, PTSD, and rule out CNS disorder  Hospitalizations: none  Outpatient Care: PCP  Substance Abuse Care: none  Self-Mutilation: none  Suicidal Attempts: none  Violent Behaviors: tends to get very verbally aggressive   Past Medical History:   Past Medical History  Diagnosis Date  . Migraine headache   . Back pain   . Obesity   . Hypertension   .  DVT (deep venous thrombosis) 2010  . Heart murmur     no cardiologist  . Asthma   . Bronchitis   . Shortness of breath     if having asthma attack  . Depression   . Diabetes mellitus without complication   . Obsessive-compulsive disorder    . PTSD (post-traumatic stress disorder)    History of Loss of Consciousness:  No Seizure History:  No Cardiac History:  No Allergies:   Allergies  Allergen Reactions  . Neurontin (Gabapentin) Other (See Comments)    Sleep walking  . Ibuprofen     Thins blood and then requires injections to thicken blood  . Penicillins Hives  . Vicodin (Hydrocodone-Acetaminophen) Itching   Current Medications:  Current Outpatient Prescriptions  Medication Sig Dispense Refill  . albuterol (PROVENTIL) (2.5 MG/3ML) 0.083% nebulizer solution Take 3 mLs (2.5 mg total) by nebulization every 6 (six) hours as needed. Use one vial per nebulizer every 6 hours as needed for wheezing  75 mL  3  . amLODipine (NORVASC) 10 MG tablet Take 1 tablet (10 mg total) by mouth daily.  90 tablet  3  . baclofen (LIORESAL) 10 MG tablet Take 10 mg by mouth 3 (three) times daily.      . Biotin (BIOTIN MAXIMUM STRENGTH) 5 MG CAPS Take 2 capsules by mouth daily.      . budesonide-formoterol (SYMBICORT) 160-4.5 MCG/ACT inhaler Inhale 2 puffs into the lungs 2 (two) times daily.  1 Inhaler  12  . Cetirizine HCl 10 MG TBDP Take 1 tablet by mouth daily.  30 tablet  5  . diazepam (VALIUM) 5 MG tablet Take 5 mg by mouth every 6 (six) hours as needed. For anxiety      . fluticasone (FLONASE) 50 MCG/ACT nasal spray Place 2 sprays into the nose daily.  16 g  2  . lidocaine (XYLOCAINE) 5 % ointment Apply 1 application topically as needed.      . metFORMIN (GLUCOPHAGE XR) 500 MG 24 hr tablet Take 1 tablet (500 mg total) by mouth daily with breakfast.  30 tablet  3  . montelukast (SINGULAIR) 10 MG tablet Take 1 tablet (10 mg total) by mouth at bedtime.  30 tablet  2  . Multiple Vitamin (MULTIVITAMIN WITH MINERALS) TABS Take 1 tablet by mouth daily.      . naproxen (NAPROSYN) 375 MG tablet Take 1 tablet (375 mg total) by mouth 2 (two) times daily with a meal.  60 tablet  2  . Olopatadine HCl (PATADAY) 0.2 % SOLN Apply 1 drop to eye daily.       Marland Kitchen oxyCODONE (ROXICODONE) 15 MG immediate release tablet Take 15 mg by mouth every 4 (four) hours as needed. For breakthrough pain      . pantoprazole (PROTONIX) 20 MG tablet take 1 tablet by mouth once daily  30 tablet  3  . potassium chloride (K-DUR,KLOR-CON) 10 MEQ tablet Dose increase, two tablets three times daily  180 tablet  5  . rizatriptan (MAXALT-MLT) 10 MG disintegrating tablet Take 1 tablet (10 mg total) by mouth as needed for migraine. May repeat in 2 hours if needed  10 tablet  2  . topiramate (TOPAMAX) 100 MG tablet Take 1 tablet (100 mg total) by mouth 2 (two) times daily.  60 tablet  2  . triamterene-hydrochlorothiazide (MAXZIDE) 75-50 MG per tablet Take 1 tablet by mouth daily.  90 tablet  3  . acyclovir (ZOVIRAX) 800 MG tablet take 1 tablet by mouth  three times a day  90 tablet  2  . albuterol (PROVENTIL HFA;VENTOLIN HFA) 108 (90 BASE) MCG/ACT inhaler Inhale 2 puffs into the lungs every 6 (six) hours as needed for wheezing.  1 Inhaler  3   No current facility-administered medications for this visit.    Previous Psychotropic Medications:  Medication Dose                          Substance Abuse History in the last 12 months: Substance Age of 1st Use Last Use Amount Specific Type  Nicotine          Alcohol          Cannabis      Opiates      Cocaine      Methamphetamines      LSD      Ecstasy      Benzodiazepines      Caffeine      Inhalants          Others:                          Medical Consequences of Substance Abuse: none  Legal Consequences of Substance Abuse: none  Family Consequences of Substance Abuse: none  Blackouts:  No DT's:  No Withdrawal Symptoms:  No   Social History: Current Place of Residence: [address] [place] [address] Place of Birth:  Family Members: partner Marital Status:   Children: 4  Sons: 3  Daughters: 1 Relationships: partner Education:   Educational Problems/Performance: none Religious  Beliefs/Practices: chriatian History of Abuse:  Teacher, music History:   Legal History: none Hobbies/Interests:   Family History:   Family History  Problem Relation Age of Onset  . Lung cancer Father   . Stomach cancer Father   . Cancer Father   . Alcohol abuse Father   . Mental illness Father   . Diabetes Sister   . Hypertension Sister   . Bipolar disorder Sister   . Schizophrenia Sister   . Diabetes Sister   . Alcohol abuse Brother   . Hypertension Brother   . Kidney disease Brother   . Diabetes Brother   . Drug abuse Brother   . Mental illness Brother   . Alcohol abuse Brother   . Alcohol abuse Brother   . Hypertension Brother   . Diabetes Brother   . Alcohol abuse Brother   . Physical abuse Mother   . Alcohol abuse Mother   . Mental illness Brother   . ADD / ADHD Neg Hx   . Anxiety disorder Neg Hx   . Dementia Neg Hx   . Depression Neg Hx   . OCD Neg Hx   . Seizures Neg Hx   . Paranoid behavior Neg Hx   . Drug abuse Sister   . Alcohol abuse Brother     Mental Status Examination/Evaluation: Objective:  Appearance: Casual  Eye Contact::  Good  Speech:  Clear and Coherent and Pressured  Volume:  Normal  Mood:  anxious  Affect:  Congruent  Thought Process:  Coherent  Orientation:  Full (Time, Place, and Person)  Thought Content:  WDL  Suicidal Thoughts:  No  Homicidal Thoughts:  No  Judgement:  Fair  Insight:  Fair  Psychomotor Activity:  Normal  Akathisia:  No  Handed:  Right  AIMS (if indicated):    Assets:  Communication Skills Desire for Improvement  Laboratory/X-Ray Psychological Evaluation(s)   Carbamezapine level  none   Assessment:    AXIS I Bipolar, Depressed and CNS disorder  AXIS II Deferred  AXIS III Past Medical History  Diagnosis Date  . Migraine headache   . Back pain   . Obesity   . Hypertension   . DVT (deep venous thrombosis) 2010  . Heart murmur     no cardiologist  . Asthma   . Bronchitis    . Shortness of breath     if having asthma attack  . Depression   . Diabetes mellitus without complication   . Obsessive-compulsive disorder   . PTSD (post-traumatic stress disorder)      AXIS IV other psychosocial or environmental problems  AXIS V 41-50 serious symptoms   Treatment Plan/Recommendations:  Psychotherapy: Supportive and CBT  Medications: Equetro  Routine PRN Medications:  Yes  Consultations: none  Safety Concerns:  none  Other:     Plan/Discussion: I took her vitals.  I reviewed CC, tobacco/med/surg Hx, meds effects/ side effects, problem list, therapies and responses as well as current situation/symptoms discussed options. See orders and pt instructions for more details.  MEDICATIONS this encounter: No orders of the defined types were placed in this encounter.    Medical Decision Making Problem Points:  New problem, with no additional work-up planned (3) and Review of psycho-social stressors (1) Data Points:  Review or order clinical lab tests (1) Review of medication regiment & side effects (2)  I certify that outpatient services furnished can reasonably be expected to improve the patient's condition.   Orson Aloe, MD, [facility]

## 2013-03-22 NOTE — Patient Instructions (Addendum)
Carbamazepine speeds up the metabolism of a lot of things including itself.  In about a month the blood level of the carbamazepine will be at about 60% of where it was when you started, so the dose will probably have to be increased.  For what is believed to be brain injury, it advised that you get  regular exercise, regular sleep, and  consume good quality, fish oil, 1000 mg twice a day. These 3 things are the foundation of rehabilitating your brain. Staying off all abusable substances including nicotine, caffeine, and refined sugar and avoiding further head injuries are the other important elements in helping you keep your brain working the best it can for you.  Strongly consider attending at least 6 [employer] Meetings to help you learn about how your helping others to the exclusion of helping yourself is actually hurting yourself and is actually an addiction to fixing others and that you need to work the 12 Step to Happiness through the [employer]. Al-Anon Family Groups could be helpful with how to deal with substance abusing family and friends. Or your own issues of being in victim role.  There are only 40 Alanon Family Group meetings a week here in [place].  Online are current listing of those meetings @ [email or web address]  There are [employer].  Search on line and there you can learn the format and can access the schedule for yourself.  Their number is [phone]  Take care of yourself.  No one else is standing up to do the job and only you know what you need.   GET SERIOUS about taking care of yourself.  Do the next right thing and that often means doing something to care for yourself along the lines of are you hungry, are you angry, are you lonely, are you tired, are you scared?  [employer] is what that stands for.  Call if problems or concerns.

## 2013-03-23 ENCOUNTER — Ambulatory Visit (INDEPENDENT_AMBULATORY_CARE_PROVIDER_SITE_OTHER): Payer: Medicare Other | Admitting: Psychiatry

## 2013-03-23 ENCOUNTER — Encounter: Payer: Self-pay | Admitting: Internal Medicine

## 2013-03-23 ENCOUNTER — Ambulatory Visit (INDEPENDENT_AMBULATORY_CARE_PROVIDER_SITE_OTHER): Payer: Managed Care, Other (non HMO) | Admitting: Internal Medicine

## 2013-03-23 ENCOUNTER — Telehealth: Payer: Self-pay

## 2013-03-23 VITALS — BP 128/76 | HR 70 | Wt 172.0 lb

## 2013-03-23 DIAGNOSIS — R109 Unspecified abdominal pain: Secondary | ICD-10-CM | POA: Diagnosis not present

## 2013-03-23 DIAGNOSIS — R143 Flatulence: Secondary | ICD-10-CM | POA: Diagnosis not present

## 2013-03-23 DIAGNOSIS — F319 Bipolar disorder, unspecified: Secondary | ICD-10-CM

## 2013-03-23 DIAGNOSIS — K5900 Constipation, unspecified: Secondary | ICD-10-CM | POA: Diagnosis not present

## 2013-03-23 DIAGNOSIS — R142 Eructation: Secondary | ICD-10-CM | POA: Diagnosis not present

## 2013-03-23 DIAGNOSIS — R630 Anorexia: Secondary | ICD-10-CM

## 2013-03-23 DIAGNOSIS — R141 Gas pain: Secondary | ICD-10-CM | POA: Diagnosis not present

## 2013-03-23 DIAGNOSIS — K5909 Other constipation: Secondary | ICD-10-CM

## 2013-03-23 DIAGNOSIS — R140 Abdominal distension (gaseous): Secondary | ICD-10-CM

## 2013-03-23 DIAGNOSIS — R634 Abnormal weight loss: Secondary | ICD-10-CM

## 2013-03-23 MED ORDER — LUBIPROSTONE 24 MCG PO CAPS: 24.0000 ug | ORAL_CAPSULE | Freq: Two times a day (BID) | ORAL | Status: DC

## 2013-03-23 NOTE — Telephone Encounter (Signed)
pls explain to pt that recent blood work did not show anemia, so no indication for iron supplement. She should take 1 multivitamin once daily, again no script available, all OTC, if she feels strongly that she wants to take iron tablet then slow fe, which is otc and well tolerated generally one daily is what I suggest, BUT April hemoglobin is normal , she is not iron deficient
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
## 2013-03-23 NOTE — Patient Instructions (Signed)
Discussed orally 

## 2013-03-23 NOTE — Progress Notes (Signed)
Patient ID: Jane English, female   DOB: 10-27-1971, 42 y.o.   MRN: [id number] HPI: Jane English is a 42 year old female with a past medical history of GERD, depression, hypertension, migraines, constipation who is seen in consultation at the request of Dr. Lodema Hong for abdominal pain and weight loss.  The patient reports years of issues with her abdomen dating back to abdominal surgeries performed in the 1990s. She reports having a previous partial hysterectomy with her left ovary in place, appendectomy, and 3 abdominal surgeries for lysis of adhesions. She reports this was last done in the 1990s by Dr. Elpidio Anis in [facility].  Dating back to the 1990s she reports ongoing issues with constipation, and having hard small pellet-like stools. She reports that Dr. Katrinka Blazing at that time told her to avoid over-the-counter laxatives as this may worsen her symptoms. She reports severe ongoing constipation having a bowel movement every 2-4 weeks. She reports occasionally she will use milk of magnesia and then be able to have a better bowel movement. After her bowel movement she reports improvement in her appetite and decreasing abdominal pain and distention. When she is severely constipated she reports a dramatic decrease in appetite and abdominal bloating and distention. She denies rectal bleeding or melena. She reports she feels like she needs a repeat lysis of adhesions due to to the discomfort and distention in her abdomen. She notes an approximate 20 pound weight loss over the last month and decreased appetite but also notes that she has not had a good are adequate bowel movement in about a month. Again, she has avoided laxatives. She does report ongoing issues with heartburn, and she was started on pantoprazole 20 mg daily. She reports this has not helped significantly. She does occasionally have nausea, which is worse when she is severely constipated. She does recall having an antinausea medicine at home  but doesn't recall which one. She does report it helps when she takes it.  Patient Active Problem List   Diagnosis Date Noted  . CNS disorder 03/22/2013  . Eczematous dermatitis of eyelid 03/11/2013  . Swelling of upper lip 03/11/2013  . Rash and nonspecific skin eruption 03/11/2013  . Abnormal weight loss 02/28/2013  . Phlebitis 02/28/2013  . Hypokalemia 02/13/2013  . Nausea alone 02/09/2013  . Seasonal allergies 12/10/2012  . Routine general medical examination at a health care facility 11/04/2012  . GERD (gastroesophageal reflux disease) 10/29/2012  . Onychomycosis 09/02/2012  . Infected nailbed of toe 09/02/2012  . Depression 04/13/2012  . Cervical neck pain with evidence of disc disease 04/13/2012  . Neck muscle spasm 12/31/2011  . Tinea 09/05/2011  . Goiter 06/07/2011  . Headache 06/07/2011  . Anxiety state, unspecified 08/30/2010  . FATIGUE 06/12/2010  . ALLERGIC RHINITIS CAUSE UNSPECIFIED 04/17/2010  . PSYCHOTIC D/O W/HALLUCINATIONS CONDS CLASS ELSW 03/04/2010  . INTRINSIC ASTHMA, UNSPECIFIED 03/04/2010  . BACK PAIN WITH RADICULOPATHY 03/09/2009  . DEPRESSION, CHRONIC 01/08/2009  . IGT (impaired glucose tolerance) 01/05/2009  . MIGRAINE HEADACHE 11/27/2007  . ESSENTIAL HYPERTENSION 11/27/2007    Past Surgical History  Procedure Laterality Date  . Appendectomy    . Carpal tunnel release    . Tubal ligation    . Lumbar spine surgery  2010    x2  . Back surgery    . Laparoscopic lysis intestinal adhesions    . Finger surgery      right pinkie finger  . Anterior cervical decomp/discectomy fusion  07/07/2012    Procedure: ANTERIOR  CERVICAL DECOMPRESSION/DISCECTOMY FUSION 2 LEVELS;  Surgeon: Karn Cassis, MD;  Location: [facility];  Service: Neurosurgery;  Laterality: N/A;  Cervical four-five, five - six  Anterior cervical decompression/diskectomy/fusion/plate  . Partial hysterectomy      Current Outpatient Prescriptions  Medication Sig Dispense Refill  .  acyclovir (ZOVIRAX) 800 MG tablet take 1 tablet by mouth three times a day  90 tablet  2  . albuterol (PROVENTIL HFA;VENTOLIN HFA) 108 (90 BASE) MCG/ACT inhaler Inhale 2 puffs into the lungs every 6 (six) hours as needed for wheezing.  1 Inhaler  3  . albuterol (PROVENTIL) (2.5 MG/3ML) 0.083% nebulizer solution Take 3 mLs (2.5 mg total) by nebulization every 6 (six) hours as needed. Use one vial per nebulizer every 6 hours as needed for wheezing  75 mL  3  . amLODipine (NORVASC) 10 MG tablet Take 1 tablet (10 mg total) by mouth daily.  90 tablet  3  . baclofen (LIORESAL) 10 MG tablet Take 10 mg by mouth 3 (three) times daily.      . Biotin (BIOTIN MAXIMUM STRENGTH) 5 MG CAPS Take 2 capsules by mouth daily.      . budesonide-formoterol (SYMBICORT) 160-4.5 MCG/ACT inhaler Inhale 2 puffs into the lungs 2 (two) times daily.  1 Inhaler  12  . Carbamazepine (EQUETRO) 100 MG CP12 Take by mouth 1 at bed for the first night or 2, then twice a day for another couple of days, then 3 time a day(1inAM and2atbed)  120 each  0  . Cetirizine HCl 10 MG TBDP Take 1 tablet by mouth daily.  30 tablet  5  . fluticasone (FLONASE) 50 MCG/ACT nasal spray Place 2 sprays into the nose daily.  16 g  2  . lidocaine (XYLOCAINE) 5 % ointment Apply 1 application topically as needed.      . metFORMIN (GLUCOPHAGE XR) 500 MG 24 hr tablet Take 1 tablet (500 mg total) by mouth daily with breakfast.  30 tablet  3  . montelukast (SINGULAIR) 10 MG tablet Take 1 tablet (10 mg total) by mouth at bedtime.  30 tablet  2  . Multiple Vitamin (MULTIVITAMIN WITH MINERALS) TABS Take 1 tablet by mouth daily.      . naproxen (NAPROSYN) 375 MG tablet Take 1 tablet (375 mg total) by mouth 2 (two) times daily with a meal.  60 tablet  2  . Olopatadine HCl (PATADAY) 0.2 % SOLN Apply 1 drop to eye daily.      Marland Kitchen oxyCODONE (ROXICODONE) 15 MG immediate release tablet Take 15 mg by mouth every 4 (four) hours as needed. For breakthrough pain      .  pantoprazole (PROTONIX) 20 MG tablet take 1 tablet by mouth once daily  30 tablet  3  . potassium chloride (K-DUR,KLOR-CON) 10 MEQ tablet Dose increase, two tablets three times daily  180 tablet  5  . rizatriptan (MAXALT-MLT) 10 MG disintegrating tablet Take 1 tablet (10 mg total) by mouth as needed for migraine. May repeat in 2 hours if needed  10 tablet  2  . topiramate (TOPAMAX) 100 MG tablet Take 1 tablet (100 mg total) by mouth 2 (two) times daily.  60 tablet  2  . triamterene-hydrochlorothiazide (MAXZIDE) 75-50 MG per tablet Take 1 tablet by mouth daily.  90 tablet  3  . lubiprostone (AMITIZA) 24 MCG capsule Take 1 capsule (24 mcg total) by mouth 2 (two) times daily with a meal.  60 capsule  3   No  current facility-administered medications for this visit.    Allergies  Allergen Reactions  . Neurontin (Gabapentin) Other (See Comments)    Sleep walking  . Ibuprofen     Thins blood and then requires injections to thicken blood  . Penicillins Hives  . Vicodin (Hydrocodone-Acetaminophen) Itching    Family History  Problem Relation Age of Onset  . Lung cancer Father   . Stomach cancer Father   . Cancer Father   . Alcohol abuse Father   . Mental illness Father   . Diabetes Sister   . Hypertension Sister   . Bipolar disorder Sister   . Schizophrenia Sister   . Diabetes Sister   . Alcohol abuse Brother   . Hypertension Brother   . Kidney disease Brother   . Diabetes Brother   . Drug abuse Brother   . Mental illness Brother   . Alcohol abuse Brother   . Alcohol abuse Brother   . Hypertension Brother   . Diabetes Brother   . Alcohol abuse Brother   . Physical abuse Mother   . Alcohol abuse Mother   . Mental illness Brother   . ADD / ADHD Neg Hx   . Anxiety disorder Neg Hx   . Dementia Neg Hx   . Depression Neg Hx   . OCD Neg Hx   . Seizures Neg Hx   . Paranoid behavior Neg Hx   . Drug abuse Sister   . Alcohol abuse Brother     History  Substance Use Topics  .  Smoking status: Never Smoker   . Smokeless tobacco: Never Used  . Alcohol Use: No    ROS: As per history of present illness, otherwise negative  BP 128/76  Pulse 70  Wt 172 lb (78.019 kg)  BMI 28.42 kg/m2  LMP 03/15/2013 Constitutional: Well-developed and well-nourished. No distress. HEENT: Normocephalic and atraumatic. Oropharynx is clear and moist. No oropharyngeal exudate. Conjunctivae are normal.  No scleral icterus. Neck: Neck supple. Thyromegaly without mass Cardiovascular: Normal rate, regular rhythm and intact distal pulses. No M/R/G Pulmonary/chest: Effort normal and breath sounds normal. No wheezing, rales or rhonchi. Abdominal: Soft, mild diffuse tenderness without rebound or guarding, nondistended. Well-healed abdominal scars. Bowel sounds active throughout. Piercing at the umbilicus. Extremities: no clubbing, cyanosis, or edema Lymphadenopathy: No cervical adenopathy noted. Neurological: Alert and oriented to person place and time. Skin: Skin is warm and dry. No rashes noted. Psychiatric: Normal mood and affect. Behavior is normal.  RELEVANT LABS AND IMAGING: CBC    Component Value Date/Time   WBC 6.9 02/26/2013 1550   RBC 4.37 02/26/2013 1550   HGB 13.4 02/26/2013 1550   HCT 38.1 02/26/2013 1550   PLT 335 02/26/2013 1550   MCV 87.2 02/26/2013 1550   MCH 30.7 02/26/2013 1550   MCHC 35.2 02/26/2013 1550   RDW 14.3 02/26/2013 1550   LYMPHSABS 2.0 02/26/2013 1550   MONOABS 0.5 02/26/2013 1550   EOSABS 0.3 02/26/2013 1550   BASOSABS 0.0 02/26/2013 1550    CMP     Component Value Date/Time   NA 138 02/26/2013 1550   K 3.4* 02/26/2013 1550   CL 103 02/26/2013 1550   CO2 26 02/26/2013 1550   GLUCOSE 117* 02/26/2013 1550   BUN 15 02/26/2013 1550   CREATININE 1.23* 02/26/2013 1550   CREATININE 1.10 02/07/2013 1450   CALCIUM 9.5 02/26/2013 1550   PROT 7.6 02/26/2013 1550   ALBUMIN 4.4 02/26/2013 1550   AST 14 02/26/2013 1550   ALT  13 02/26/2013 1550   ALKPHOS 50 02/26/2013 1550    BILITOT 0.5 02/26/2013 1550   GFRNONAA 61* 02/07/2013 1450   GFRAA 70* 02/07/2013 1450   TSH 0.7 INR 1.02   ASSESSMENT/PLAN: 42 year old female with a past medical history of GERD, depression, hypertension, migraines, constipation who is seen in consultation at the request of Dr. Lodema Hong for abdominal pain and weight loss.  1.  Severe, chronic constipation/history of abd surgeries including LOA -- based on her history chronic and severe constipation may explain a great deal, if not all of her current abdominal symptoms.  It appears that her appetite, abdominal pain, and bloating are all better when she uses milk of magnesia to induce bowel movement. She has been very hesitant to use laxatives due to the previous physician recommendation. Her constipation has been long-standing dating back at least 15 years. I would like to start her on lubiprostone 24 mcg twice daily.  Hopefully this will allow her to establish some regularity with bowel movements and improve her symptoms. I would like for her to take this twice daily on a scheduled basis and try to not skip doses. She is advised that diarrhea is the primary side effect, and is asked to call me if that occurs. She voices understanding. Would like to see her back in a short amount of time, 4 weeks, to ensure she has improved. If she has not I would likely pursue cross-sectional imaging, or consider colonoscopy.  2.  GERD -- incompletely controlled with pantoprazole 20 mg daily, I recommended increasing to 40 mg once daily. She is reminded that this medicine works best when taken 30 minutes to one hour before her first meal of the day.

## 2013-03-23 NOTE — Patient Instructions (Addendum)
You have been given samples of Amitiza, and a prescription was sent to your pharmacy.  Follow up in 4-6 weeks                                                We are excited to introduce MyChart, a new best-in-class service that provides you online access to important information in your electronic medical record. We want to make it easier for you to view your health information - all in one secure location - when and where you need it. We expect MyChart will enhance the quality of care and service we provide.  When you register for MyChart, you can:    View your test results.    Request appointments and receive appointment reminders via email.    Request medication renewals.    View your medical history, allergies, medications and immunizations.    Communicate with your physician's office through a password-protected site.    Conveniently print information such as your medication lists.  To find out if MyChart is right for you, please talk to a member of our clinical staff today. We will gladly answer your questions about this free health and wellness tool.  If you are age 33 or older and want a member of your family to have access to your record, you must provide written consent by completing a proxy form available at our office. Please speak to our clinical staff about guidelines regarding accounts for patients younger than age 19.  As you activate your MyChart account and need any technical assistance, please call the MyChart technical support line at [phone]) or email your question to [email or web address]. If you email your question(s), please include your name, a return phone number and the best time to reach you.  If you have non-urgent health-related questions, you can send a message to our office through MyChart at [email or web address]. If you have a medical emergency, call 911.  Thank you for using MyChart as your new health and wellness  resource!   MyChart licensed from [employer],  [id number]. Patents Pending.

## 2013-03-23 NOTE — Telephone Encounter (Signed)
Wanted a rx of iron pills. I advised they were otc but she said she wanted a rx for them. Please advise  [facility]

## 2013-03-24 NOTE — Telephone Encounter (Signed)
Patient aware.

## 2013-03-26 DIAGNOSIS — F4310 Post-traumatic stress disorder, unspecified: Secondary | ICD-10-CM | POA: Insufficient documentation

## 2013-03-26 NOTE — Telephone Encounter (Signed)
Appointment at [facility] is 8.11.2014 at 3:00 patient is aware

## 2013-03-30 ENCOUNTER — Telehealth: Payer: Self-pay | Admitting: Internal Medicine

## 2013-03-30 DIAGNOSIS — Z981 Arthrodesis status: Secondary | ICD-10-CM | POA: Diagnosis not present

## 2013-03-30 DIAGNOSIS — M545 Low back pain, unspecified: Secondary | ICD-10-CM | POA: Diagnosis not present

## 2013-03-30 DIAGNOSIS — Z79899 Other long term (current) drug therapy: Secondary | ICD-10-CM | POA: Diagnosis not present

## 2013-03-30 DIAGNOSIS — G894 Chronic pain syndrome: Secondary | ICD-10-CM | POA: Diagnosis not present

## 2013-03-30 DIAGNOSIS — M961 Postlaminectomy syndrome, not elsewhere classified: Secondary | ICD-10-CM | POA: Diagnosis not present

## 2013-03-30 DIAGNOSIS — T85695A Other mechanical complication of other nervous system device, implant or graft, initial encounter: Secondary | ICD-10-CM | POA: Diagnosis not present

## 2013-03-30 DIAGNOSIS — Z5181 Encounter for therapeutic drug level monitoring: Secondary | ICD-10-CM | POA: Diagnosis not present

## 2013-03-30 DIAGNOSIS — Z462 Encounter for fitting and adjustment of other devices related to nervous system and special senses: Secondary | ICD-10-CM | POA: Diagnosis not present

## 2013-03-30 DIAGNOSIS — R209 Unspecified disturbances of skin sensation: Secondary | ICD-10-CM | POA: Diagnosis not present

## 2013-03-30 DIAGNOSIS — Z9889 Other specified postprocedural states: Secondary | ICD-10-CM | POA: Diagnosis not present

## 2013-03-30 MED ORDER — PANTOPRAZOLE SODIUM 20 MG PO TBEC: 40.0000 mg | DELAYED_RELEASE_TABLET | Freq: Every day | ORAL | Status: DC

## 2013-03-30 NOTE — Telephone Encounter (Signed)
Sent in Rx for pantoprazole 40mg  to pt's pharmacy. Pharmacy will notify pt when ready

## 2013-04-02 ENCOUNTER — Other Ambulatory Visit: Payer: Self-pay | Admitting: Family Medicine

## 2013-04-05 ENCOUNTER — Encounter (HOSPITAL_COMMUNITY): Payer: Self-pay | Admitting: Psychiatry

## 2013-04-05 ENCOUNTER — Ambulatory Visit (INDEPENDENT_AMBULATORY_CARE_PROVIDER_SITE_OTHER): Payer: Medicare Other | Admitting: Psychiatry

## 2013-04-05 VITALS — BP 120/75 | HR 77 | Ht 65.5 in | Wt 168.2 lb

## 2013-04-05 DIAGNOSIS — F3130 Bipolar disorder, current episode depressed, mild or moderate severity, unspecified: Secondary | ICD-10-CM

## 2013-04-05 DIAGNOSIS — F329 Major depressive disorder, single episode, unspecified: Secondary | ICD-10-CM

## 2013-04-05 DIAGNOSIS — G988 Other disorders of nervous system: Secondary | ICD-10-CM

## 2013-04-05 DIAGNOSIS — F411 Generalized anxiety disorder: Secondary | ICD-10-CM

## 2013-04-05 DIAGNOSIS — G969 Disorder of central nervous system, unspecified: Secondary | ICD-10-CM

## 2013-04-05 DIAGNOSIS — F4310 Post-traumatic stress disorder, unspecified: Secondary | ICD-10-CM

## 2013-04-05 MED ORDER — CARBAMAZEPINE ER 100 MG PO CP12: ORAL_CAPSULE | ORAL | Status: DC

## 2013-04-05 NOTE — Patient Instructions (Signed)
Keep up the [facility].  Take care of yourself.  No one else is standing up to do the job and only you know what you need.   GET SERIOUS about taking care of yourself.  Do the next right thing and that often means doing something to care for yourself along the lines of are you hungry, are you angry, are you lonely, are you tired, are you scared?  [employer] is what that stands for.  Call if problems or concerns.
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
## 2013-04-06 DIAGNOSIS — G8928 Other chronic postprocedural pain: Secondary | ICD-10-CM | POA: Diagnosis not present

## 2013-04-06 DIAGNOSIS — Z9889 Other specified postprocedural states: Secondary | ICD-10-CM | POA: Diagnosis not present

## 2013-04-06 DIAGNOSIS — M545 Low back pain, unspecified: Secondary | ICD-10-CM | POA: Diagnosis not present

## 2013-04-06 DIAGNOSIS — M961 Postlaminectomy syndrome, not elsewhere classified: Secondary | ICD-10-CM | POA: Diagnosis not present

## 2013-04-06 DIAGNOSIS — G894 Chronic pain syndrome: Secondary | ICD-10-CM | POA: Diagnosis not present

## 2013-04-06 DIAGNOSIS — Z462 Encounter for fitting and adjustment of other devices related to nervous system and special senses: Secondary | ICD-10-CM | POA: Diagnosis not present

## 2013-04-07 ENCOUNTER — Ambulatory Visit (INDEPENDENT_AMBULATORY_CARE_PROVIDER_SITE_OTHER): Payer: Medicare Other | Admitting: Psychiatry

## 2013-04-07 DIAGNOSIS — F319 Bipolar disorder, unspecified: Secondary | ICD-10-CM | POA: Diagnosis not present

## 2013-04-08 NOTE — Patient Instructions (Signed)
Discussed orally 
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
## 2013-04-09 ENCOUNTER — Encounter: Payer: Self-pay | Admitting: Family Medicine

## 2013-04-09 ENCOUNTER — Ambulatory Visit (INDEPENDENT_AMBULATORY_CARE_PROVIDER_SITE_OTHER): Payer: Medicare Other | Admitting: Family Medicine

## 2013-04-09 VITALS — BP 136/70 | HR 77 | Resp 18

## 2013-04-09 DIAGNOSIS — R7302 Impaired glucose tolerance (oral): Secondary | ICD-10-CM

## 2013-04-09 DIAGNOSIS — R7309 Other abnormal glucose: Secondary | ICD-10-CM

## 2013-04-09 DIAGNOSIS — K5909 Other constipation: Secondary | ICD-10-CM

## 2013-04-09 DIAGNOSIS — Z79899 Other long term (current) drug therapy: Secondary | ICD-10-CM | POA: Diagnosis not present

## 2013-04-09 DIAGNOSIS — R519 Headache, unspecified: Secondary | ICD-10-CM

## 2013-04-09 DIAGNOSIS — I10 Essential (primary) hypertension: Secondary | ICD-10-CM

## 2013-04-09 DIAGNOSIS — R51 Headache: Secondary | ICD-10-CM

## 2013-04-09 DIAGNOSIS — J45901 Unspecified asthma with (acute) exacerbation: Secondary | ICD-10-CM

## 2013-04-09 DIAGNOSIS — K5900 Constipation, unspecified: Secondary | ICD-10-CM

## 2013-04-09 DIAGNOSIS — J4541 Moderate persistent asthma with (acute) exacerbation: Secondary | ICD-10-CM

## 2013-04-09 HISTORY — DX: Unspecified asthma with (acute) exacerbation: J45.901

## 2013-04-09 MED ORDER — PREDNISONE (PAK) 5 MG PO TABS: 5.0000 mg | ORAL_TABLET | ORAL | Status: DC

## 2013-04-09 MED ORDER — ALBUTEROL SULFATE (5 MG/ML) 0.5% IN NEBU
2.5000 mg | INHALATION_SOLUTION | Freq: Once | RESPIRATORY_TRACT | Status: AC
  Administered 2013-04-09: 2.5 mg via RESPIRATORY_TRACT

## 2013-04-09 MED ORDER — IPRATROPIUM BROMIDE 0.02 % IN SOLN
0.5000 mg | Freq: Once | RESPIRATORY_TRACT | Status: AC
  Administered 2013-04-09: 0.5 mg via RESPIRATORY_TRACT

## 2013-04-09 MED ORDER — METHYLPREDNISOLONE ACETATE 80 MG/ML IJ SUSP
80.0000 mg | Freq: Once | INTRAMUSCULAR | Status: AC
  Administered 2013-04-09: 80 mg via INTRAMUSCULAR

## 2013-04-09 NOTE — Progress Notes (Signed)
  Subjective:    Patient ID: Jane English, female    DOB: May 14, 1971, 42 y.o.   MRN: [id number]  HPI 3 day h/o wheezingg and chest tightness after mold exposure in th cupboard of her drawing room, has had to use her inhalers and nebulizers at home excessively since then . Denies fever, chills or sputum production States her landlord is working on the problem but she is still very symptomatic. Has seen neurologist for headaches with improvement, she is seen at [facility] Currently being followed by mental health and this has been very beneficial, less anxious and depressed and irritable She ha also seen GI for chronic abdominal pai and reports improvement in this also   Review of Systems See HPI Denies recent fever or chills. Denies sinus pressure, nasal congestion, ear pain or sore throat.  Denies chest pains, palpitations and leg swelling Denies abdominal pain, nausea, vomiting,diarrhea or constipation.   Denies dysuria, frequency, hesitancy or incontinence.  Denies headaches, seizures, numbness, or tingling. Denies uncontrolled depression, anxiety or insomnia. Denies skin break down or rash.        Objective:   Physical Exam  Patient alert and oriented and in no cardiopulmonary distress.  HEENT: No facial asymmetry, EOMI, no sinus tenderness,  oropharynx pink and moist.  Neck supple no adenopathy.  Chest: decreased air entry, bilateral wheezes, no crackles  CVS: S1, S2 no murmurs, no S3.  ABD: Soft non tender. Bowel sounds normal.  Ext: No edema  MS: Adequate ROM spine, shoulders, hips and knees.  Skin: Intact, no ulcerations or rash noted.  Psych: Good eye contact, normal affect. Memory intact not anxious or depressed appearing.  CNS: CN 2-12 intact, power, tone and sensation normal throughout.       Assessment & Plan:

## 2013-04-09 NOTE — Addendum Note (Signed)
Addended by: Kandis Fantasia B on: 04/09/2013 03:43 PM   Modules accepted: Orders

## 2013-04-09 NOTE — Assessment & Plan Note (Signed)
Improved on current medication 

## 2013-04-09 NOTE — Assessment & Plan Note (Signed)
Reports improvement on new med from GI

## 2013-04-09 NOTE — Assessment & Plan Note (Signed)
Controlled, no change in medication  

## 2013-04-09 NOTE — Assessment & Plan Note (Signed)
Acute flare following mold exposure in her home, reportedly. Neb treatment x 1 and depo medrol 80mg  IM followed by prednisone dose pack

## 2013-04-09 NOTE — Patient Instructions (Addendum)
F/u in 2 month, cancel sooner appointment, call if you need me before  You are being treated for acute flare of asthma  Neb treatment and depo medriol in the office followed by prednisone dose pack  Fasting lipid, cmp and EGFR and HBA1C in 2 month

## 2013-04-09 NOTE — Assessment & Plan Note (Signed)
Patient educated about the importance of limiting  Carbohydrate intake , the need to commit to daily physical activity for a minimum of 30 minutes , and to commit weight loss. The fact that changes in all these areas will reduce or eliminate all together the development of diabetes is stressed.   Updated lab for next visit 

## 2013-04-10 ENCOUNTER — Other Ambulatory Visit: Payer: Self-pay | Admitting: Family Medicine

## 2013-04-15 ENCOUNTER — Encounter: Payer: Self-pay | Admitting: Internal Medicine

## 2013-04-20 ENCOUNTER — Other Ambulatory Visit (HOSPITAL_COMMUNITY): Payer: Self-pay | Admitting: Psychiatry

## 2013-04-22 ENCOUNTER — Other Ambulatory Visit: Payer: Self-pay | Admitting: Family Medicine

## 2013-04-22 ENCOUNTER — Telehealth: Payer: Self-pay | Admitting: Family Medicine

## 2013-04-22 MED ORDER — SCOPOLAMINE 1 MG/3DAYS TD PT72: 1.0000 | MEDICATED_PATCH | TRANSDERMAL | Status: AC

## 2013-04-22 NOTE — Telephone Encounter (Signed)
Please advise 

## 2013-04-22 NOTE — Telephone Encounter (Signed)
Trans scopolamine has been sent in this is for motion sickness,that is what people use when travelling,pls  tell her also that I will be thinking about her , and I KNOW that her wedding and trip after aka honeymoon , will be wonderful!

## 2013-04-22 NOTE — Telephone Encounter (Signed)
pls call pharmacy and see wif there is a more affordable equivaklent covered  By medicare.  Otherwise dramamine OTC is used for motion sickness.

## 2013-04-22 NOTE — Telephone Encounter (Signed)
Patient notified by pharmacy first.

## 2013-04-23 ENCOUNTER — Ambulatory Visit (INDEPENDENT_AMBULATORY_CARE_PROVIDER_SITE_OTHER): Payer: Medicare Other | Admitting: Internal Medicine

## 2013-04-23 ENCOUNTER — Encounter: Payer: Self-pay | Admitting: Internal Medicine

## 2013-04-23 ENCOUNTER — Other Ambulatory Visit (INDEPENDENT_AMBULATORY_CARE_PROVIDER_SITE_OTHER): Payer: Medicaid Other

## 2013-04-23 VITALS — BP 100/62 | HR 72 | Ht 65.25 in | Wt 167.4 lb

## 2013-04-23 DIAGNOSIS — R112 Nausea with vomiting, unspecified: Secondary | ICD-10-CM

## 2013-04-23 DIAGNOSIS — R1031 Right lower quadrant pain: Secondary | ICD-10-CM

## 2013-04-23 DIAGNOSIS — K219 Gastro-esophageal reflux disease without esophagitis: Secondary | ICD-10-CM

## 2013-04-23 DIAGNOSIS — K5900 Constipation, unspecified: Secondary | ICD-10-CM

## 2013-04-23 DIAGNOSIS — K5909 Other constipation: Secondary | ICD-10-CM

## 2013-04-23 LAB — COMPREHENSIVE METABOLIC PANEL
AST: 16 U/L (ref 0–37)
Albumin: 3.9 g/dL (ref 3.5–5.2)
BUN: 14 mg/dL (ref 6–23)
Calcium: 8.9 mg/dL (ref 8.4–10.5)
Chloride: 105 mEq/L (ref 96–112)
Glucose, Bld: 91 mg/dL (ref 70–99)
Potassium: 3.2 mEq/L — ABNORMAL LOW (ref 3.5–5.1)

## 2013-04-23 MED ORDER — PANTOPRAZOLE SODIUM 40 MG PO TBEC: 40.0000 mg | DELAYED_RELEASE_TABLET | Freq: Every day | ORAL | Status: DC

## 2013-04-23 NOTE — Telephone Encounter (Signed)
Patient aware.

## 2013-04-23 NOTE — Progress Notes (Signed)
Subjective:    Patient ID: Jane English, female    DOB: 1971/04/22, 42 y.o.   MRN: [id number]  HPI Jane English is a 42 year old female with a past medical history of GERD, depression, hypertension, migraines, constipation, multiple abdominal surgeries status post lysis of adhesion is seen for followup. At her last visit we discussed chronic constipation she was started on lubiprostone 24 mcg twice daily. With this she reports improvement in her constipation and she is now having a bowel movement nearly every day. She reports she feels like these bowel movements are effective and complete. She is still having right-sided lower, pain. She reports at times this is a cramping or "tugging" type pain. She also has to massage her right lower quadrant help the pain improved. It is intermittent in nature. She can then use denied rectal bleeding or melena. Overall nausea and vomiting have improved since being seen here. However she was recently started on a fentanyl patch for chronic pain by Dr. Cherrie Distance, her pain management physician. With this she has noted nausea and occasional vomiting. She was off the patch for 2 weeks and had no nausea or vomiting during that period. She plans to speak to him about changing this medication at her followup appointment on 04/28/2013. No fevers or chills. She is still having some issues with heartburn. She is taking pantoprazole 20 mg daily. We recommended increasing this to 40 mg daily, but she said a new prescription was generated and so she wasn't sure whether or not to increase her dose.  No dysphagia/odynophagia  Review of Systems As per history of present illness, otherwise negative  Current Medications, Allergies, Past Medical History, Past Surgical History, Family History and Social History were reviewed in [employer] record.     Objective:   Physical Exam BP 100/62  Pulse 72  Ht 5' 5.25" (1.657 m)  Wt 167 lb 6 oz (75.921 kg)  BMI 27.65  kg/m2 Constitutional: Well-developed and well-nourished. No distress. HEENT: Normocephalic and atraumatic. Oropharynx is clear and moist. No oropharyngeal exudate. Conjunctivae are normal.  No scleral icterus. Neck: Neck supple. Trachea midline. Cardiovascular: Normal rate, regular rhythm and intact distal pulses.  Pulmonary/chest: Effort normal and breath sounds normal. No wheezing, rales or rhonchi. Abdominal: Soft, mild lower, tenderness without rebound or guarding, nondistended. Bowel sounds active throughout. There are no masses palpable.  Extremities: no clubbing, cyanosis, or edema Lymphadenopathy: No cervical adenopathy noted. Neurological: Alert and oriented to person place and time. Skin: Skin is warm and dry. No rashes noted. Psychiatric: Normal mood and affect. Behavior is normal.    TSH - April 2014 - normal CBC    Component Value Date/Time   WBC 6.9 02/26/2013 1550   RBC 4.37 02/26/2013 1550   HGB 13.4 02/26/2013 1550   HCT 38.1 02/26/2013 1550   PLT 335 02/26/2013 1550   MCV 87.2 02/26/2013 1550   MCH 30.7 02/26/2013 1550   MCHC 35.2 02/26/2013 1550   RDW 14.3 02/26/2013 1550   LYMPHSABS 2.0 02/26/2013 1550   MONOABS 0.5 02/26/2013 1550   EOSABS 0.3 02/26/2013 1550   BASOSABS 0.0 02/26/2013 1550    CMP     Component Value Date/Time   NA 138 02/26/2013 1550   K 3.4* 02/26/2013 1550   CL 103 02/26/2013 1550   CO2 26 02/26/2013 1550   GLUCOSE 117* 02/26/2013 1550   BUN 15 02/26/2013 1550   CREATININE 1.23* 02/26/2013 1550   CREATININE 1.10 02/07/2013 1450   CALCIUM  9.5 02/26/2013 1550   PROT 7.6 02/26/2013 1550   ALBUMIN 4.4 02/26/2013 1550   AST 14 02/26/2013 1550   ALT 13 02/26/2013 1550   ALKPHOS 50 02/26/2013 1550   BILITOT 0.5 02/26/2013 1550   GFRNONAA 61* 02/07/2013 1450   GFRAA 70* 02/07/2013 1450        Assessment & Plan:  42 year old female with a past medical history of GERD, depression, hypertension, migraines, constipation, multiple abdominal surgeries status post  lysis of adhesion is seen for followup.  1.  RLQ abd pain -- this pain has persisted despite having improvement in her constipation. I recommended a CT scan of the abdomen and pelvis with contrast to better evaluate this pain. It could be secondary to chronic abdominal adhesive disease given her multiple abdominal surgeries. She is interested in surgical consultation to discuss repeat lysis of adhesions. I have discussed with her that at times this can be helpful, but at other times only lead to formation of new adhesions. I would like to perform the CT scan first before surgical consultation.  2.  Chronic constipation/opioid use -- she has idiopathic constipation only exacerbated by opioid use for chronic pain. She has had a nice response to lubiprostone and we will continue at the current dose twice daily.  3.  GERD -- some breakthrough on pantoprazole 20 mg daily and so we will increase to 40 mg once daily. She is reminded that this medicine works best 30 minutes before her first meal of the day.  4.  Nausea/vomiting -- this is most likely related to opioid use.  She specifically relates it to the initiation of fentanyl. When the fentanyl patch has been removed her nausea and vomiting have been completely resolved. She will discuss changing chronic pain medication at her upcoming pain management clinic on 04/28/2013  I would like to see her back in early August 2014 after her upcoming wedding and honeymoon.  Her other providers are Dr. Lodema Hong, her primary care provider in [place].  Dr. Jeral Fruit, her orthopedic surgeon.  Dr. Cherrie Distance, her pain management specialist

## 2013-04-23 NOTE — Patient Instructions (Addendum)
You have been scheduled for a CT scan of the abdomen and pelvis at [facility] ([address]---this is in the same building as [facility]).   You are scheduled on 04/27/2013 at 9:00. You should arrive 15 minutes prior to your appointment time for registration. Please follow the written instructions below on the day of your exam:  WARNING: IF YOU ARE ALLERGIC TO IODINE/X-RAY DYE, PLEASE NOTIFY RADIOLOGY IMMEDIATELY AT [phone]! YOU WILL BE GIVEN A 13 HOUR PREMEDICATION PREP.  1) Do not eat or drink anything after 5:00 (4 hours prior to your test) 2) You have been given 2 bottles of oral contrast to drink. The solution may taste better if refrigerated, but do NOT add ice or any other liquid to this solution. Shake well before drinking.    Drink 1 bottle of contrast @ 7:00 (2 hours prior to your exam)  Drink 1 bottle of contrast @ 8:00 (1 hour prior to your exam)  You may take any medications as prescribed with a small amount of water except for the following: Metformin, Glucophage, Glucovance, Avandamet, Riomet, Fortamet, Actoplus Met, Janumet, Glumetza or Metaglip. The above medications must be held the day of the exam AND 48 hours after the exam.  The purpose of you drinking the oral contrast is to aid in the visualization of your intestinal tract. The contrast solution may cause some diarrhea. Before your exam is started, you will be given a small amount of fluid to drink. Depending on your individual set of symptoms, you may also receive an intravenous injection of x-ray contrast/dye. Plan on being at [facility] for 30 minutes or long, depending on the type of exam you are having performed.  If you have any questions regarding your exam or if you need to reschedule, you may call the CT department at [phone] between the hours of 8:00 am and 5:00 pm, Monday-Friday.  ________________________________________________________________________  We have sent the  following medications to your pharmacy for you to pick up at your convenience: Continue taking Amitiza, we increased your protonix to 40mg  daily  Follow up with Dr. Rhea Belton in late August

## 2013-04-24 LAB — CBC
MCV: 96 fl (ref 78.0–100.0)
RBC: 3.75 Mil/uL — ABNORMAL LOW (ref 3.87–5.11)

## 2013-04-27 ENCOUNTER — Ambulatory Visit (INDEPENDENT_AMBULATORY_CARE_PROVIDER_SITE_OTHER)
Admission: RE | Admit: 2013-04-27 | Discharge: 2013-04-27 | Disposition: A | Discharge status: Home / Self Care | Payer: Medicare Other | Source: Ambulatory Visit | Attending: Internal Medicine | Admitting: Internal Medicine

## 2013-04-27 DIAGNOSIS — R1031 Right lower quadrant pain: Secondary | ICD-10-CM | POA: Diagnosis not present

## 2013-04-27 DIAGNOSIS — K219 Gastro-esophageal reflux disease without esophagitis: Secondary | ICD-10-CM | POA: Diagnosis not present

## 2013-04-27 MED ORDER — IOHEXOL 300 MG/ML  SOLN
100.0000 mL | Freq: Once | INTRAMUSCULAR | Status: AC | PRN
  Administered 2013-04-27: 100 mL via INTRAVENOUS

## 2013-05-14 ENCOUNTER — Encounter: Payer: Self-pay | Admitting: Internal Medicine

## 2013-05-17 ENCOUNTER — Other Ambulatory Visit: Payer: Self-pay

## 2013-05-17 ENCOUNTER — Ambulatory Visit: Payer: Medicare Other | Admitting: Family Medicine

## 2013-05-17 DIAGNOSIS — I10 Essential (primary) hypertension: Secondary | ICD-10-CM

## 2013-05-17 MED ORDER — POTASSIUM CHLORIDE CRYS ER 10 MEQ PO TBCR: EXTENDED_RELEASE_TABLET | ORAL | Status: DC

## 2013-05-17 MED ORDER — TRIAMTERENE-HCTZ 75-50 MG PO TABS: 1.0000 | ORAL_TABLET | Freq: Every day | ORAL | Status: DC

## 2013-05-17 MED ORDER — AMLODIPINE BESYLATE 10 MG PO TABS: 10.0000 mg | ORAL_TABLET | Freq: Every day | ORAL | Status: DC

## 2013-05-17 MED ORDER — ALPRAZOLAM 0.5 MG PO TABS: 0.5000 mg | ORAL_TABLET | Freq: Three times a day (TID) | ORAL | Status: DC | PRN

## 2013-05-17 MED ORDER — METFORMIN HCL ER 500 MG PO TB24: 500.0000 mg | ORAL_TABLET | Freq: Every day | ORAL | Status: DC

## 2013-05-18 ENCOUNTER — Ambulatory Visit (INDEPENDENT_AMBULATORY_CARE_PROVIDER_SITE_OTHER): Payer: Medicare Other | Admitting: Psychiatry

## 2013-05-18 DIAGNOSIS — F319 Bipolar disorder, unspecified: Secondary | ICD-10-CM | POA: Diagnosis not present

## 2013-05-18 NOTE — Patient Instructions (Signed)
Discussed orally 
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
## 2013-05-21 ENCOUNTER — Ambulatory Visit (INDEPENDENT_AMBULATORY_CARE_PROVIDER_SITE_OTHER): Payer: Managed Care, Other (non HMO) | Admitting: Internal Medicine

## 2013-05-21 ENCOUNTER — Ambulatory Visit: Payer: Self-pay | Admitting: Internal Medicine

## 2013-05-21 ENCOUNTER — Encounter: Payer: Self-pay | Admitting: Internal Medicine

## 2013-05-21 VITALS — BP 126/78 | HR 72 | Ht 65.25 in | Wt 168.5 lb

## 2013-05-21 DIAGNOSIS — R110 Nausea: Secondary | ICD-10-CM | POA: Diagnosis not present

## 2013-05-21 DIAGNOSIS — K219 Gastro-esophageal reflux disease without esophagitis: Secondary | ICD-10-CM

## 2013-05-21 DIAGNOSIS — Z1211 Encounter for screening for malignant neoplasm of colon: Secondary | ICD-10-CM

## 2013-05-21 DIAGNOSIS — R109 Unspecified abdominal pain: Secondary | ICD-10-CM | POA: Diagnosis not present

## 2013-05-21 DIAGNOSIS — M5090 Cervical disc disorder, unspecified, unspecified cervical region: Secondary | ICD-10-CM

## 2013-05-21 MED ORDER — PEG-KCL-NACL-NASULF-NA ASC-C 100 G PO SOLR: 1.0000 | Freq: Once | ORAL | Status: DC

## 2013-05-21 NOTE — Progress Notes (Signed)
Subjective:    Patient ID: Jane English, female    DOB: 1970-12-04, 42 y.o.   MRN: [id number]  HPI Jane English is a 42 year old female with a past medical history of GERD, depression, hypertension, migraines, constipation, multiple abdominal surgeries status post lysis of adhesion is seen for followup. She recently got married and took a honeymoon to the [place]. After her last visit she had a CT scan of her abdomen and pelvis which was unremarkable except for postoperative spinal changes. She has continued to have right-sided abdominal pain which seems to come and go. She feels it is worse in the morning and at times after eating. It also bothers her through the night. She's having some mild nausea but no vomiting. Nausea decreased with decreased fentanyl dose. She remains on fentanyl patch 12.5 mcg.  She continues on Amitiza 24 mcg BID with good result.  She is having a bowel movement nearly daily. No blood in her stool nor melena. No fevers or chills. Heartburn better controlled with increasing pantoprazole to 40 mg daily   Review of Systems As per history of present illness, otherwise  Current Medications, Allergies, Past Medical History, Past Surgical History, Family History and Social History were reviewed in Jane English record.     Objective:   Physical Exam BP 126/78  Pulse 72  Ht 5' 5.25" (1.657 m)  Wt 168 lb 8 oz (76.431 kg)  BMI 27.84 kg/m2  LMP 05/04/2013 Constitutional: Well-developed and well-nourished. No distress. HEENT: Normocephalic and atraumatic.  No scleral icterus. Cardiovascular: Normal rate, regular rhythm and intact distal pulses. No M/R/G Pulmonary/chest: Effort normal and breath sounds normal. No wheezing, rales or rhonchi. Abdominal: Soft, mild right-sided abdominal tenderness, nondistended. Bowel sounds active throughout. No rebound or guarding  Extremities: no clubbing, cyanosis, or edema Neurological: Alert and oriented to person  place and time. Skin: Skin is warm and dry. No rashes noted. Psychiatric: Normal mood and affect. Behavior is normal.      Assessment & Plan:  42 year old female with a past medical history of GERD, depression, hypertension, migraines, constipation, multiple abdominal surgeries status post lysis of adhesion is seen for followup.   1. RLQ and right middle abd pain -- this pain has persisted despite having improvement in her constipation. A CT scan of the abdomen and pelvis with contrast was unremarkable regarding this pain. I do feel that this could be related to chronic abdominal adhesive disease. Due to the persistence of this pain I would like to perform an upper endoscopy and colonoscopy to rule out upper GI pathology such as peptic ulcer disease, and also right colon pathology such as inflammation. We discussed the test today including the risks and benefits and she is agreeable to proceed. Also in the differential would be biliary dyskinesia. Her gallbladder was normal by CT, and I have recommended a HIDA scan to determine gallbladder function.  If these are negative she is still interested in talking to a surgeon about lysis of adhesions. We discussed how though it can lead to more adhesions but this would be a discussion for her to have with a surgeon after her workup is complete.  2. Chronic constipation/opioid use -- she has idiopathic constipation only exacerbated by opioid use for chronic pain. She has had a nice response to lubiprostone and we will continue at the current dose twice daily.   3. GERD -- better controlled with pantoprazole 40 mg once daily. We'll continue at this dose for now  4. Nausea-- no further vomiting, I felt her nausea remains in part secondary to narcotic pain medications. Upper endoscopy will also help rule out structural pathology in the upper GI tract which could contribute to nausea.   Her other providers are Dr. Lodema Hong, her primary care provider in  [place]. Dr. Jeral Fruit, her orthopedic surgeon. Dr. Cherrie Distance, her pain management specialist

## 2013-05-21 NOTE — Patient Instructions (Addendum)
You have been scheduled for a Endoscopy/colonoscopy with propofol. Please follow written instructions given to you at your visit today.  Please pick up your prep kit at the pharmacy within the next 1-3 days. If you use inhalers (even only as needed), please bring them with you on the day of your procedure. Your physician has requested that you go to www.startemmi.com and enter the access code given to you at your visit today. This web site gives a general overview about your procedure. However, you should still follow specific instructions given to you by our office regarding your preparation for the procedure.   You have been scheduled for a HIDA scan at [facility] (1st floor) on August 1 st. Please arrive 15 minutes prior to your scheduled appointment on 9 AM. Make certain not to have anything to eat or drink after midnight prior to your test. Discontinue all pain medications 6 hours prior to the test. Should this appointment date or time not work well for you, please call radiology scheduling at [phone].  _____________________________________________________________________ hepatobiliary (HIDA) scan is an imaging procedure used to diagnose problems in the liver, gallbladder and bile ducts. In the HIDA scan, a radioactive chemical or tracer is injected into a vein in your arm. The tracer is handled by the liver like bile. Bile is a fluid produced and excreted by your liver that helps your digestive system break down fats in the foods you eat. Bile is stored in your gallbladder and the gallbladder releases the bile when you eat a meal. A special nuclear medicine scanner (gamma camera) tracks the flow of the tracer from your liver into your gallbladder and small intestine.  During your HIDA scan  You'll be asked to change into a hospital gown before your HIDA scan begins. Your health care team will position you on a table, usually on your back. The radioactive tracer is then injected into a  vein in your arm.The tracer travels through your bloodstream to your liver, where it's taken up by the bile-producing cells. The radioactive tracer travels with the bile from your liver into your gallbladder and through your bile ducts to your small intestine.You may feel some pressure while the radioactive tracer is injected into your vein. As you lie on the table, a special gamma camera is positioned over your abdomen taking pictures of the tracer as it moves through your body. The gamma camera takes pictures continually for about an hour. You'll need to keep still during the HIDA scan. This can become uncomfortable, but you may find that you can lessen the discomfort by taking deep breaths and thinking about other things. Tell your health care team if you're uncomfortable. The radiologist will watch on a computer the progress of the radioactive tracer through your body. The HIDA scan may be stopped when the radioactive tracer is seen in the gallbladder and enters your small intestine. This typically takes about an hour. In some cases extra imaging will be performed if original images aren't satisfactory, if morphine is given to help visualize the gallbladder or if the medication CCK is given to look at the contraction of the gallbladder. This test typically takes 2 hours to complete. ________________________________________________________________________

## 2013-05-26 DIAGNOSIS — Z9889 Other specified postprocedural states: Secondary | ICD-10-CM | POA: Diagnosis not present

## 2013-05-26 DIAGNOSIS — G894 Chronic pain syndrome: Secondary | ICD-10-CM | POA: Diagnosis not present

## 2013-05-26 DIAGNOSIS — M961 Postlaminectomy syndrome, not elsewhere classified: Secondary | ICD-10-CM | POA: Diagnosis not present

## 2013-05-27 ENCOUNTER — Ambulatory Visit (HOSPITAL_COMMUNITY): Payer: Self-pay | Admitting: Psychiatry

## 2013-05-28 ENCOUNTER — Ambulatory Visit (INDEPENDENT_AMBULATORY_CARE_PROVIDER_SITE_OTHER): Payer: Medicare Other | Admitting: Psychiatry

## 2013-05-28 DIAGNOSIS — F4310 Post-traumatic stress disorder, unspecified: Secondary | ICD-10-CM | POA: Diagnosis not present

## 2013-05-28 DIAGNOSIS — F319 Bipolar disorder, unspecified: Secondary | ICD-10-CM | POA: Diagnosis not present

## 2013-05-28 NOTE — Patient Instructions (Signed)
Discussed orally 
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
## 2013-05-30 ENCOUNTER — Other Ambulatory Visit (HOSPITAL_COMMUNITY): Payer: Self-pay | Admitting: Psychiatry

## 2013-06-02 ENCOUNTER — Ambulatory Visit (INDEPENDENT_AMBULATORY_CARE_PROVIDER_SITE_OTHER): Payer: Medicare Other | Admitting: Psychiatry

## 2013-06-02 DIAGNOSIS — F319 Bipolar disorder, unspecified: Secondary | ICD-10-CM

## 2013-06-02 DIAGNOSIS — F29 Unspecified psychosis not due to a substance or known physiological condition: Secondary | ICD-10-CM | POA: Diagnosis not present
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
## 2013-06-02 NOTE — Patient Instructions (Signed)
Discussed orally 

## 2013-06-03 ENCOUNTER — Encounter (HOSPITAL_COMMUNITY): Payer: Self-pay | Admitting: Psychiatry

## 2013-06-03 ENCOUNTER — Ambulatory Visit (INDEPENDENT_AMBULATORY_CARE_PROVIDER_SITE_OTHER): Payer: Medicare Other | Admitting: Psychiatry

## 2013-06-03 DIAGNOSIS — F319 Bipolar disorder, unspecified: Secondary | ICD-10-CM

## 2013-06-03 DIAGNOSIS — F4310 Post-traumatic stress disorder, unspecified: Secondary | ICD-10-CM | POA: Diagnosis not present

## 2013-06-03 MED ORDER — CARBAMAZEPINE ER 100 MG PO CP12: ORAL_CAPSULE | ORAL | Status: DC
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
## 2013-06-04 ENCOUNTER — Encounter (HOSPITAL_COMMUNITY)
Admission: RE | Admit: 2013-06-04 | Discharge: 2013-06-04 | Disposition: A | Discharge status: Home / Self Care | Payer: Medicare Other | Source: Ambulatory Visit | Attending: Internal Medicine | Admitting: Internal Medicine

## 2013-06-04 DIAGNOSIS — R109 Unspecified abdominal pain: Secondary | ICD-10-CM | POA: Diagnosis not present

## 2013-06-04 DIAGNOSIS — Z1211 Encounter for screening for malignant neoplasm of colon: Secondary | ICD-10-CM

## 2013-06-04 DIAGNOSIS — R1011 Right upper quadrant pain: Secondary | ICD-10-CM | POA: Diagnosis not present

## 2013-06-04 DIAGNOSIS — R110 Nausea: Secondary | ICD-10-CM | POA: Diagnosis not present

## 2013-06-04 MED ORDER — SINCALIDE 5 MCG IJ SOLR: 0.0200 ug/kg | Freq: Once | INTRAMUSCULAR | Status: DC

## 2013-06-04 MED ORDER — TECHNETIUM TC 99M MEBROFENIN IV KIT
5.5000 | PACK | Freq: Once | INTRAVENOUS | Status: AC | PRN
  Administered 2013-06-04: 6 via INTRAVENOUS

## 2013-06-07 ENCOUNTER — Ambulatory Visit (INDEPENDENT_AMBULATORY_CARE_PROVIDER_SITE_OTHER): Payer: Medicare Other | Admitting: Family Medicine

## 2013-06-07 ENCOUNTER — Encounter: Payer: Self-pay | Admitting: Family Medicine

## 2013-06-07 VITALS — BP 130/80 | HR 80 | Resp 16 | Ht 65.25 in | Wt 166.8 lb

## 2013-06-07 DIAGNOSIS — R7301 Impaired fasting glucose: Secondary | ICD-10-CM

## 2013-06-07 DIAGNOSIS — R519 Headache, unspecified: Secondary | ICD-10-CM

## 2013-06-07 DIAGNOSIS — M62838 Other muscle spasm: Secondary | ICD-10-CM

## 2013-06-07 DIAGNOSIS — I10 Essential (primary) hypertension: Secondary | ICD-10-CM

## 2013-06-07 DIAGNOSIS — F411 Generalized anxiety disorder: Secondary | ICD-10-CM

## 2013-06-07 DIAGNOSIS — R51 Headache: Secondary | ICD-10-CM | POA: Diagnosis not present

## 2013-06-07 DIAGNOSIS — J302 Other seasonal allergic rhinitis: Secondary | ICD-10-CM

## 2013-06-07 DIAGNOSIS — G43909 Migraine, unspecified, not intractable, without status migrainosus: Secondary | ICD-10-CM | POA: Diagnosis not present

## 2013-06-07 DIAGNOSIS — R7302 Impaired glucose tolerance (oral): Secondary | ICD-10-CM

## 2013-06-07 DIAGNOSIS — M5090 Cervical disc disorder, unspecified, unspecified cervical region: Secondary | ICD-10-CM

## 2013-06-07 DIAGNOSIS — F329 Major depressive disorder, single episode, unspecified: Secondary | ICD-10-CM

## 2013-06-07 DIAGNOSIS — J309 Allergic rhinitis, unspecified: Secondary | ICD-10-CM

## 2013-06-07 DIAGNOSIS — R7309 Other abnormal glucose: Secondary | ICD-10-CM

## 2013-06-07 DIAGNOSIS — J45909 Unspecified asthma, uncomplicated: Secondary | ICD-10-CM

## 2013-06-07 LAB — HEMOGLOBIN A1C
Hgb A1c MFr Bld: 5.7 % — ABNORMAL HIGH (ref <5.7)
Mean Plasma Glucose: 117 mg/dL — ABNORMAL HIGH (ref <117)

## 2013-06-07 MED ORDER — TOPIRAMATE 100 MG PO TABS: 100.0000 mg | ORAL_TABLET | Freq: Every day | ORAL | Status: DC

## 2013-06-07 MED ORDER — RIZATRIPTAN BENZOATE 10 MG PO TBDP: ORAL_TABLET | ORAL | Status: DC

## 2013-06-07 MED ORDER — CYCLOBENZAPRINE HCL 10 MG PO TABS: ORAL_TABLET | ORAL | Status: DC

## 2013-06-07 NOTE — Patient Instructions (Addendum)
F/u in early October , call if you need me  Before  Labs today hBA1c and chem 7 and EGFr  Take topamax every night to reduce headache frequency  Start muscle relaxant one at bedtime to help with muscle spasm and sleep  Use maxalt as directed for headache.  Work out the hurt that you have inside  So that you feel better.  Start swimming at the [facility] regularly to help with stress relief

## 2013-06-07 NOTE — Progress Notes (Signed)
  Subjective:    Patient ID: Jane English, female    DOB: [birth year], 42 y.o.   MRN: [id number]  HPI The PT is here for follow up and re-evaluation of chronic medical conditions, medication management and review of any available recent lab and radiology data.  Preventive health is updated, specifically  Cancer screening and Immunization.   Still experiencing a lot of stress and anxiety with poor relationship with her in law, needs to continue with therapy The PT denies any adverse reactions to current medications since the last visit.  C/o neck spasm, requests med., and also uncontrolled headache in terms of frequency and severity , since she has not been taking the topamax as prescribed      Review of Systems See HPI Denies recent fever or chills. Denies sinus pressure, nasal congestion, ear pain or sore throat. Denies chest congestion, productive cough or wheezing. Denies chest pains, palpitations and leg swelling Chronic abdominal pain and spasm with nausea, undergoing GI eval  Denies dysuria, frequency, hesitancy or incontinence. C/o chronic back and neck pain with increased spasm in neck Denies headaches, seizures, numbness, or tingling. Denies skin break down or rash.        Objective:   Physical Exam  Patient alert and oriented and in no cardiopulmonary distress.  HEENT: No facial asymmetry, EOMI, no sinus tenderness,  oropharynx pink and moist.  Neck decreased ROM, no adenopathy.  Chest: Clear to auscultation bilaterally.  CVS: S1, S2 no murmurs, no S3.  ABD: Soft non tender. Bowel sounds normal.  Ext: No edema  MS: Adequate ROM spine, shoulders, hips and knees.  Skin: Intact, no ulcerations or rash noted.  Psych: Good eye contact, t. Memory intact  anxious and  depressed appearing.  CNS: CN 2-12 intact, power, normal throughout.       Assessment & Plan:

## 2013-06-08 LAB — COMPLETE METABOLIC PANEL WITH GFR
Albumin: 4 g/dL (ref 3.5–5.2)
Alkaline Phosphatase: 57 U/L (ref 39–117)
BUN: 12 mg/dL (ref 6–23)
Creat: 0.86 mg/dL (ref 0.50–1.10)
GFR, Est Non African American: 82 mL/min
Glucose, Bld: 86 mg/dL (ref 70–99)
Potassium: 4 mEq/L (ref 3.5–5.3)
Total Bilirubin: 0.5 mg/dL (ref 0.3–1.2)

## 2013-06-11 ENCOUNTER — Ambulatory Visit (AMBULATORY_SURGERY_CENTER): Payer: Medicare Other | Admitting: Internal Medicine

## 2013-06-11 ENCOUNTER — Encounter: Payer: Self-pay | Admitting: Internal Medicine

## 2013-06-11 ENCOUNTER — Other Ambulatory Visit: Payer: Self-pay | Admitting: Gastroenterology

## 2013-06-11 ENCOUNTER — Ambulatory Visit (HOSPITAL_COMMUNITY): Payer: Self-pay | Admitting: Psychiatry

## 2013-06-11 VITALS — BP 124/74 | HR 85 | Temp 97.7°F | Resp 15 | Ht 65.0 in | Wt 168.0 lb

## 2013-06-11 DIAGNOSIS — K2980 Duodenitis without bleeding: Secondary | ICD-10-CM

## 2013-06-11 DIAGNOSIS — D126 Benign neoplasm of colon, unspecified: Secondary | ICD-10-CM | POA: Diagnosis not present

## 2013-06-11 DIAGNOSIS — R1031 Right lower quadrant pain: Secondary | ICD-10-CM | POA: Diagnosis not present

## 2013-06-11 DIAGNOSIS — Z1211 Encounter for screening for malignant neoplasm of colon: Secondary | ICD-10-CM

## 2013-06-11 DIAGNOSIS — G8929 Other chronic pain: Secondary | ICD-10-CM | POA: Diagnosis not present

## 2013-06-11 DIAGNOSIS — A048 Other specified bacterial intestinal infections: Secondary | ICD-10-CM | POA: Diagnosis not present

## 2013-06-11 DIAGNOSIS — R110 Nausea: Secondary | ICD-10-CM

## 2013-06-11 DIAGNOSIS — B9681 Helicobacter pylori [H. pylori] as the cause of diseases classified elsewhere: Secondary | ICD-10-CM

## 2013-06-11 DIAGNOSIS — K219 Gastro-esophageal reflux disease without esophagitis: Secondary | ICD-10-CM | POA: Diagnosis not present

## 2013-06-11 HISTORY — DX: Helicobacter pylori (H. pylori) as the cause of diseases classified elsewhere: B96.81

## 2013-06-11 LAB — GLUCOSE, CAPILLARY: Glucose-Capillary: 124 mg/dL — ABNORMAL HIGH (ref 70–99)

## 2013-06-11 MED ORDER — DEXTROSE 5 % IV SOLN: INTRAVENOUS | Status: DC

## 2013-06-11 MED ORDER — LUBIPROSTONE 24 MCG PO CAPS: 24.0000 ug | ORAL_CAPSULE | Freq: Every day | ORAL | Status: DC

## 2013-06-11 MED ORDER — SODIUM CHLORIDE 0.9 % IV SOLN: 500.0000 mL | INTRAVENOUS | Status: DC

## 2013-06-11 NOTE — Progress Notes (Signed)
Called to room to assist during endoscopic procedure.  Patient ID and intended procedure confirmed with present staff. Received instructions for my participation in the procedure from the performing physician.  

## 2013-06-11 NOTE — Op Note (Signed)
[facility] [address] [place], [address]   COLONOSCOPY PROCEDURE REPORT  PATIENT: Jane English, Jane English  MR#: [id number] BIRTHDATE: 12-07-1970 , 42  yrs. old GENDER: Female ENDOSCOPIST: Beverley Fiedler, MD PROCEDURE DATE:  06/11/2013 PROCEDURE:   Colonoscopy with snare polypectomy First Screening Colonoscopy - Avg.  risk and is 50 yrs.  old or older - No.  Prior Negative Screening - Now for repeat screening. N/A  History of Adenoma - Now for follow-up colonoscopy & has been > or = to 3 yrs.  N/A  Polyps Removed Today? Yes. ASA CLASS:   Class III INDICATIONS:abdominal pain in the lower right quadrant. MEDICATIONS: MAC sedation, administered by CRNA and propofol (Diprivan) 200mg  IV  DESCRIPTION OF PROCEDURE:   After the risks benefits and alternatives of the procedure were thoroughly explained, informed consent was obtained.  A digital rectal exam revealed no rectal mass.   The LB [id number]  endoscope was introduced through the anus and advanced to the terminal ileum which was intubated for a short distance. No adverse events experienced.   The quality of the prep was good, using MoviPrep  The instrument was then slowly withdrawn as the colon was fully examined.   COLON FINDINGS: The mucosa appeared normal in the terminal ileum. A sessile polyp measuring 4 mm in size was found in the ascending colon.  A polypectomy was performed with a cold snare.  The resection was complete and the polyp tissue was completely retrieved.   The colon mucosa was otherwise normal.  Retroflexed views revealed no abnormalities. The time to cecum=2 minutes 19 seconds.  Withdrawal time=11 minutes 46 seconds.  The scope was withdrawn and the procedure completed. COMPLICATIONS: There were no complications.  ENDOSCOPIC IMPRESSION: 1.   Normal mucosa in the terminal ileum 2.   Sessile polyp measuring 4 mm in size was found in the ascending colon; polypectomy was performed with a cold  snare 3.   The colon mucosa was otherwise normal  RECOMMENDATIONS: 1.  Await pathology results 2.  Continue current medications 3.  If the polyp removed today is proven to be an adenomatous (pre-cancerous) polyp, you will need a repeat colonoscopy in 5 years.  Otherwise you should continue to follow colorectal cancer screening guidelines for "routine risk" patients with colonoscopy in 10 years.  You will receive a letter within 1-2 weeks with the results of your biopsy as well as final recommendations.  Please call my office if you have not received a letter after 3 weeks.   eSigned:  Beverley Fiedler, MD 06/11/2013 3:35 PM  cc: The Patient and Syliva Overman, MD

## 2013-06-11 NOTE — Progress Notes (Signed)
Patient did not experience any of the following events: a burn prior to discharge; a fall within the facility; wrong site/side/patient/procedure/implant event; or a hospital transfer or hospital admission upon discharge from the facility. (G8907) Patient did not have preoperative order for IV antibiotic SSI prophylaxis. (G8918)  

## 2013-06-11 NOTE — Progress Notes (Signed)
Pt stable to RR 

## 2013-06-11 NOTE — Patient Instructions (Addendum)
YOU HAD AN ENDOSCOPIC PROCEDURE TODAY AT THE [facility]: Refer to the procedure report that was given to you for any specific questions about what was found during the examination.  If the procedure report does not answer your questions, please call your gastroenterologist to clarify.  If you requested that your care partner not be given the details of your procedure findings, then the procedure report has been included in a sealed envelope for you to review at your convenience later.  YOU SHOULD EXPECT: Some feelings of bloating in the abdomen. Passage of more gas than usual.  Walking can help get rid of the air that was put into your GI tract during the procedure and reduce the bloating. If you had a lower endoscopy (such as a colonoscopy or flexible sigmoidoscopy) you may notice spotting of blood in your stool or on the toilet paper. If you underwent a bowel prep for your procedure, then you may not have a normal bowel movement for a few days.  DIET: Your first meal following the procedure should be a light meal and then it is ok to progress to your normal diet.  A half-sandwich or bowl of soup is an example of a good first meal.  Heavy or fried foods are harder to digest and may make you feel nauseous or bloated.  Likewise meals heavy in dairy and vegetables can cause extra gas to form and this can also increase the bloating.  Drink plenty of fluids but you should avoid alcoholic beverages for 24 hours.  ACTIVITY: Your care partner should take you home directly after the procedure.  You should plan to take it easy, moving slowly for the rest of the day.  You can resume normal activity the day after the procedure however you should NOT DRIVE or use heavy machinery for 24 hours (because of the sedation medicines used during the test).    SYMPTOMS TO REPORT IMMEDIATELY: A gastroenterologist can be reached at any hour.  During normal business hours, 8:30 AM to 5:00 PM Monday through Friday,  call [phone].  After hours and on weekends, please call the GI answering service at [phone] who will take a message and have the physician on call contact you.   Following lower endoscopy (colonoscopy or flexible sigmoidoscopy):  Excessive amounts of blood in the stool  Significant tenderness or worsening of abdominal pains  Swelling of the abdomen that is new, acute  Fever of 100F or higher  Following upper endoscopy (EGD)  Vomiting of blood or coffee ground material  New chest pain or pain under the shoulder blades  Painful or persistently difficult swallowing  New shortness of breath  Fever of 100F or higher  Black, tarry-looking stools  FOLLOW UP: If any biopsies were taken you will be contacted by phone or by letter within the next 1-3 weeks.  Call your gastroenterologist if you have not heard about the biopsies in 3 weeks.  Our staff will call the home number listed on your records the next business day following your procedure to check on you and address any questions or concerns that you may have at that time regarding the information given to you following your procedure. This is a courtesy call and so if there is no answer at the home number and we have not heard from you through the emergency physician on call, we will assume that you have returned to your regular daily activities without incident.  SIGNATURES/CONFIDENTIALITY: You and/or your care   partner have signed paperwork which will be entered into your electronic medical record.  These signatures attest to the fact that that the information above on your After Visit Summary has been reviewed and is understood.  Full responsibility of the confidentiality of this discharge information lies with you and/or your care-partner.   Resume medications. Information given on polyps with discharge instructions. 

## 2013-06-11 NOTE — Op Note (Signed)
[facility] [address] [place], [address]   ENDOSCOPY PROCEDURE REPORT  PATIENT: Jane, English  MR#: [id number] BIRTHDATE: 01/18/1971 , 42  yrs. old GENDER: Female ENDOSCOPIST: Beverley Fiedler, MD PROCEDURE DATE:  06/11/2013 PROCEDURE:  EGD w/ biopsy for H.pylori ASA CLASS:     Class III INDICATIONS:  abdominal pain in the lower and middle right quadrant. Nausea.   history of esophageal reflux. MEDICATIONS: MAC sedation, administered by CRNA and propofol (Diprivan) 150mg  IV TOPICAL ANESTHETIC: none  DESCRIPTION OF PROCEDURE: After the risks benefits and alternatives of the procedure were thoroughly explained, informed consent was obtained.  The LB [id number] endoscope was introduced through the mouth and advanced to the second portion of the duodenum. Without limitations.  The instrument was slowly withdrawn as the mucosa was fully examined.  ESOPHAGUS: The mucosa of the esophagus appeared normal.  STOMACH: The mucosa of the stomach appeared normal.  Biopsies were taken in the antrum and angularis.  DUODENUM: Mild duodenal inflammation was found in the duodenal bulb. The duodenal mucosa showed no abnormalities in the 2nd part of the duodenum.  Retroflexed views revealed no abnormalities.     The scope was then withdrawn from the patient and the procedure completed.  COMPLICATIONS: There were no complications.  ENDOSCOPIC IMPRESSION: 1.   The mucosa of the esophagus appeared normal 2.   The mucosa of the stomach appeared normal; biopsies were taken in the antrum and angularis 3.   Duodenal inflammation was found in the duodenal bulb 4.   The duodenal mucosa showed no abnormalities in the 2nd part of the duodenum  RECOMMENDATIONS: 1.  Await pathology results 2.  Follow-up of helicobacter pylori status, treat if indicated 3.  Continue taking your PPI (pantoprazole) once daily.  It is best to be taken 20-30 minutes prior to breakfast  meal.  eSigned:  Beverley Fiedler, MD 06/11/2013 3:30 PM   [id number] Lodema Hong, MD

## 2013-06-12 DIAGNOSIS — R109 Unspecified abdominal pain: Secondary | ICD-10-CM | POA: Diagnosis not present

## 2013-06-12 DIAGNOSIS — Z1211 Encounter for screening for malignant neoplasm of colon: Secondary | ICD-10-CM | POA: Diagnosis not present

## 2013-06-12 DIAGNOSIS — R110 Nausea: Secondary | ICD-10-CM | POA: Diagnosis not present

## 2013-06-14 ENCOUNTER — Telehealth: Payer: Self-pay | Admitting: *Deleted

## 2013-06-14 DIAGNOSIS — L259 Unspecified contact dermatitis, unspecified cause: Secondary | ICD-10-CM | POA: Diagnosis not present

## 2013-06-14 NOTE — Telephone Encounter (Signed)
Left message that we called for f/u 

## 2013-06-16 ENCOUNTER — Encounter: Payer: Self-pay | Admitting: Internal Medicine

## 2013-06-18 ENCOUNTER — Other Ambulatory Visit: Payer: Self-pay | Admitting: Family Medicine

## 2013-06-22 ENCOUNTER — Telehealth: Payer: Self-pay | Admitting: *Deleted

## 2013-06-22 NOTE — Telephone Encounter (Signed)
lmom for pt to call back

## 2013-06-22 NOTE — Telephone Encounter (Signed)
Message copied by Florene Glen on Tue Jun 22, 2013  3:48 PM ------      Message from: Beverley Fiedler      Created: Wed Jun 16, 2013  9:33 AM       H pylori positive, please treat with Pylera and BID PPI      Colon polyp benign and not-precancerous             ------

## 2013-06-24 ENCOUNTER — Ambulatory Visit (HOSPITAL_COMMUNITY): Payer: Self-pay | Admitting: Psychiatry

## 2013-06-25 MED ORDER — BIS SUBCIT-METRONID-TETRACYC 140-125-125 MG PO CAPS: 3.0000 | ORAL_CAPSULE | Freq: Three times a day (TID) | ORAL | Status: DC

## 2013-06-25 NOTE — Addendum Note (Signed)
Addended by: Florene Glen on: 06/25/2013 11:24 AM   Modules accepted: Orders

## 2013-06-25 NOTE — Telephone Encounter (Signed)
Need to order Pylera at [facility], not [facility].

## 2013-06-25 NOTE — Telephone Encounter (Signed)
Informed pt of the need for AB therapy for H. Pylori and she will need Pantoprazole BID while taking Pylera; pt stated understanding she is presently in [place]

## 2013-06-27 NOTE — Assessment & Plan Note (Signed)
Improved on daily prophylactic regime

## 2013-06-27 NOTE — Assessment & Plan Note (Signed)
Improved with dietary change, weight loss and metformin, pt to continue same

## 2013-06-27 NOTE — Assessment & Plan Note (Signed)
Increased and uncontrolled, pt encouraged to ventilate

## 2013-06-27 NOTE — Assessment & Plan Note (Signed)
Controlled, no change in medication  

## 2013-06-27 NOTE — Assessment & Plan Note (Signed)
Controlled, no change in medication DASH diet and commitment to daily physical activity for a minimum of 30 minutes discussed and encouraged, as a part of hypertension management. The importance of attaining a healthy weight is also discussed.  

## 2013-06-27 NOTE — Assessment & Plan Note (Signed)
Pt to start muscle relaxant

## 2013-06-27 NOTE — Assessment & Plan Note (Signed)
Uncontrolled , has not been taking as directed for prohylaxis, educated about this.Also allowed pt to vent, has a lot of anger inside about a relationship with an in law, encouraged her to re direct her negative energy into positive energy, she is to continue to follow with therpy also

## 2013-06-27 NOTE — Assessment & Plan Note (Signed)
Still uncontrolled, continue with psych, and medication Not suicidal or homicidal

## 2013-06-28 ENCOUNTER — Ambulatory Visit (INDEPENDENT_AMBULATORY_CARE_PROVIDER_SITE_OTHER): Payer: Medicare Other | Admitting: Surgery

## 2013-07-02 ENCOUNTER — Ambulatory Visit (HOSPITAL_COMMUNITY): Payer: Self-pay | Admitting: Psychiatry

## 2013-07-06 ENCOUNTER — Telehealth: Payer: Self-pay | Admitting: Internal Medicine

## 2013-07-06 ENCOUNTER — Telehealth: Payer: Self-pay | Admitting: Family Medicine

## 2013-07-06 NOTE — Telephone Encounter (Signed)
Requested information faxed

## 2013-07-07 ENCOUNTER — Ambulatory Visit (INDEPENDENT_AMBULATORY_CARE_PROVIDER_SITE_OTHER): Payer: Commercial Indemnity | Admitting: Surgery

## 2013-07-07 ENCOUNTER — Telehealth: Payer: Self-pay | Admitting: Internal Medicine

## 2013-07-07 ENCOUNTER — Encounter (INDEPENDENT_AMBULATORY_CARE_PROVIDER_SITE_OTHER): Payer: Self-pay | Admitting: Surgery

## 2013-07-07 VITALS — BP 124/84 | HR 80 | Temp 98.4°F | Resp 14 | Ht 66.0 in | Wt 167.4 lb

## 2013-07-07 DIAGNOSIS — R1031 Right lower quadrant pain: Secondary | ICD-10-CM | POA: Insufficient documentation

## 2013-07-07 DIAGNOSIS — K2980 Duodenitis without bleeding: Secondary | ICD-10-CM

## 2013-07-07 DIAGNOSIS — A048 Other specified bacterial intestinal infections: Secondary | ICD-10-CM

## 2013-07-07 DIAGNOSIS — B9681 Helicobacter pylori [H. pylori] as the cause of diseases classified elsewhere: Secondary | ICD-10-CM | POA: Insufficient documentation

## 2013-07-07 NOTE — Patient Instructions (Addendum)
See the Handout(s) we gave you.  Consider surgery For a laparoscopic possible open lysis of adhesions.  Please call our office at [phone] if you wish to schedule surgery or if you have further questions / concerns.    GETTING TO GOOD BOWEL HEALTH. Irregular bowel habits such as constipation and diarrhea can lead to many problems over time.  Having one soft bowel movement a day is the most important way to prevent further problems.  The anorectal canal is designed to handle stretching and feces to safely manage our ability to get rid of solid waste (feces, poop, stool) out of our body.  BUT, hard constipated stools can act like ripping concrete bricks and diarrhea can be a burning fire to this very sensitive area of our body, causing inflamed hemorrhoids, anal fissures, increasing risk is perirectal abscesses, abdominal pain/bloating, an making irritable bowel worse.     The goal: ONE SOFT BOWEL MOVEMENT A DAY!  To have soft, regular bowel movements:    Drink at least 8 tall glasses of water a day.     Take plenty of fiber.  Fiber is the undigested part of plant food that passes into the colon, acting s "natures broom" to encourage bowel motility and movement.  Fiber can absorb and hold large amounts of water. This results in a larger, bulkier stool, which is soft and easier to pass. Work gradually over several weeks up to 6 servings a day of fiber (25g a day even more if needed) in the form of: o Vegetables -- Root (potatoes, carrots, turnips), leafy green (lettuce, salad greens, celery, spinach), or cooked high residue (cabbage, broccoli, etc) o Fruit -- Fresh (unpeeled skin & pulp), Dried (prunes, apricots, cherries, etc ),  or stewed ( applesauce)  o Whole grain breads, pasta, etc (whole wheat)  o Bran cereals    Bulking Agents -- This type of water-retaining fiber generally is easily obtained each day by one of the following:  o Psyllium bran -- The psyllium plant is remarkable because its  ground seeds can retain so much water. This product is available as Metamucil, Konsyl, Effersyllium, Per Diem Fiber, or the less expensive generic preparation in drug and health food stores. Although labeled a laxative, it really is not a laxative.  o Methylcellulose -- This is another fiber derived from wood which also retains water. It is available as Citrucel. o Polyethylene Glycol - and "artificial" fiber commonly called Miralax or Glycolax.  It is helpful for people with gassy or bloated feelings with regular fiber o Flax Seed - a less gassy fiber than psyllium   No reading or other relaxing activity while on the toilet. If bowel movements take longer than 5 minutes, you are too constipated   AVOID CONSTIPATION.  High fiber and water intake usually takes care of this.  Sometimes a laxative is needed to stimulate more frequent bowel movements, but    Laxatives are not a good long-term solution as it can wear the colon out. o Osmotics (Milk of Magnesia, Fleets phosphosoda, Magnesium citrate, MiraLax, GoLytely) are safer than  o Stimulants (Senokot, Castor Oil, Dulcolax, Ex Lax)    o Do not take laxatives for more than 7days in a row.    IF SEVERELY CONSTIPATED, try a Bowel Retraining Program: o Do not use laxatives.  o Eat a diet high in roughage, such as bran cereals and leafy vegetables.  o Drink six (6) ounces of prune or apricot juice each morning.  o  Eat two (2) large servings of stewed fruit each day.  o Take one (1) heaping tablespoon of a psyllium-based bulking agent twice a day. Use sugar-free sweetener when possible to avoid excessive calories.  o Eat a normal breakfast.  o Set aside 15 minutes after breakfast to sit on the toilet, but do not strain to have a bowel movement.  o If you do not have a bowel movement by the third day, use an enema and repeat the above steps.    Controlling diarrhea o Switch to liquids and simpler foods for a few days to avoid stressing your intestines  further. o Avoid dairy products (especially milk & ice cream) for a short time.  The intestines often can lose the ability to digest lactose when stressed. o Avoid foods that cause gassiness or bloating.  Typical foods include beans and other legumes, cabbage, broccoli, and dairy foods.  Every person has some sensitivity to other foods, so listen to our body and avoid those foods that trigger problems for you. o Adding fiber (Citrucel, Metamucil, psyllium, Miralax) gradually can help thicken stools by absorbing excess fluid and retrain the intestines to act more normally.  Slowly increase the dose over a few weeks.  Too much fiber too soon can backfire and cause cramping & bloating. o Probiotics (such as active yogurt, Align, etc) may help repopulate the intestines and colon with normal bacteria and calm down a sensitive digestive tract.  Most studies show it to be of mild help, though, and such products can be costly. o Medicines:   Bismuth subsalicylate (ex. Kayopectate, Pepto Bismol) every 30 minutes for up to 6 doses can help control diarrhea.  Avoid if pregnant.   Loperamide (Immodium) can slow down diarrhea.  Start with two tablets (4mg  total) first and then try one tablet every 6 hours.  Avoid if you are having fevers or severe pain.  If you are not better or start feeling worse, stop all medicines and call your doctor for advice o Call your doctor if you are getting worse or not better.  Sometimes further testing (cultures, endoscopy, X-ray studies, bloodwork, etc) may be needed to help diagnose and treat the cause of the diarrhea. o

## 2013-07-07 NOTE — Progress Notes (Signed)
Subjective:     Patient ID: Jane English, female   DOB: 01/27/1971, 42 y.o.   MRN: [id number]  HPI  Jane English  01/24/1971 [id number]  Patient Care Team: Margaret E Simpson, MD as PCP - General Tamiah Dysart C. Thersa Mohiuddin, MD as Consulting Physician (General Surgery) Ernesto M Botero, MD as Consulting Physician (Neurosurgery) Jay M Pyrtle, MD as Consulting Physician (Gastroenterology)  This patient is a 42 y.o.female who presents today for surgical evaluation at the request of Dr. Pyrtle.   Reason for visit: Lower abdominal pain.  Concern of recurrent adhesions/partial obstruction  Pleasant woman with numerous health issues.  Has had prior surgeries.  She had episodes of severe right lower quadrant crampy abdominal pain.  Underwent open lysis of adhesions in 2003.  Found to have an internal hernia from prior lesions.  Her symptoms resolved.  She been doing relatively well until this year when she started getting a similar pain again.  Has had episodes of nausea and bloating.  Has had a few episodes of vomiting.  She describes the pain as colicky.  It moves into her lower abdomen.  Some occasional sharp tugging pain as well.  Not worse with activity.  Usually worse at the end of the day.  No vaginal bleeding or discharge.  She does feel abdominal pain with her periods but not markedly worse than.  Known prior history of endometriosis that she knows of.  Gastroenterology consultation was made.  Upper endoscopy revealed duodenitis that is H. Pylori positive.  She has chronic reflux which has been controlled with PPI.  She is in the process of trying to get H. Pylori treatment.  Some issues with her insurance company covering the recommended regimen given her penicillin allergy.  She had a colonoscopy which noted lymphoid polyp but no other abnormalities.  She had struggled with constipation but since starting on Amitiza she now has about three bowel movements a day.  Ultrasound reveals no gallstones.  HIDA  scan revealed normal gallbladder ejection fraction and no reproduction of pain.  Does have reactive airway disease/arthritis controlled with inhalers.  She can walk about 30 minutes before she has to stop.  Patient Active Problem List   Diagnosis Date Noted  . H. pylori duodenitis 07/07/2013  . Colicky RLQ abdominal pain, probable chronic PSBO 07/07/2013  . Asthma flare 04/09/2013  . PTSD (post-traumatic stress disorder) 03/26/2013  . Chronic constipation 03/23/2013  . CNS disorder 03/22/2013  . Chronic migraine without aura 03/18/2013  . Insomnia 03/18/2013  . Swelling of upper lip 03/11/2013  . Rash and nonspecific skin eruption 03/11/2013  . Abnormal weight loss 02/28/2013  . Phlebitis 02/28/2013  . Hypokalemia 02/13/2013  . Nausea alone 02/09/2013  . Seasonal allergies 12/10/2012  . Routine general medical examination at a health care facility 11/04/2012  . GERD (gastroesophageal reflux disease) 10/29/2012  . Chronic pain syndrome 10/06/2012  . Postlaminectomy syndrome 10/06/2012  . Onychomycosis 09/02/2012  . Cervical neck pain with evidence of disc disease 04/13/2012  . Neck muscle spasm 12/31/2011  . Anxiety state, unspecified 08/30/2010  . ALLERGIC RHINITIS CAUSE UNSPECIFIED 04/17/2010  . PSYCHOTIC D/O W/HALLUCINATIONS CONDS CLASS ELSW 03/04/2010  . INTRINSIC ASTHMA, UNSPECIFIED 03/04/2010  . BACK PAIN WITH RADICULOPATHY 03/09/2009  . DEPRESSION, CHRONIC 01/08/2009  . IGT (impaired glucose tolerance) 01/05/2009  . MIGRAINE HEADACHE 11/27/2007  . ESSENTIAL HYPERTENSION 11/27/2007    Past Medical History  Diagnosis Date  . Migraine headache   . Back pain   . Obesity   .   Hypertension   . DVT (deep venous thrombosis) 2010  . Heart murmur     no cardiologist  . Asthma   . Bronchitis   . Shortness of breath     if having asthma attack  . Depression   . Diabetes mellitus without complication   . Obsessive-compulsive disorder   . PTSD (post-traumatic stress  disorder)     Past Surgical History  Procedure Laterality Date  . Appendectomy    . Carpal tunnel release    . Tubal ligation    . Lumbar spine surgery  2010    x2  . Back surgery    . Lysis of adhesion  2003    Dr. Leroy Smith  . Trigger finger release  2009    right pinkie finger  . Anterior cervical decomp/discectomy fusion  07/07/2012    Procedure: ANTERIOR CERVICAL DECOMPRESSION/DISCECTOMY FUSION 2 LEVELS;  Surgeon: Ernesto M Botero, MD;  Location: [facility];  Service: Neurosurgery;  Laterality: N/A;  Cervical four-five, five - six  Anterior cervical decompression/diskectomy/fusion/plate  . Partial hysterectomy      History   Social History  . Marital Status: Married    Spouse Name: N/A    Number of Children: N/A  . Years of Education: N/A   Occupational History  . Not on file.   Social History Main Topics  . Smoking status: Never Smoker   . Smokeless tobacco: Never Used  . Alcohol Use: No  . Drug Use: No  . Sexual Activity: Not on file   Other Topics Concern  . Not on file   Social History Narrative  . No narrative on file    Family History  Problem Relation Age of Onset  . Lung cancer Father   . Stomach cancer Father   . Cancer Father   . Alcohol abuse Father   . Mental illness Father   . Diabetes Sister   . Hypertension Sister   . Bipolar disorder Sister   . Schizophrenia Sister   . Diabetes Sister   . Alcohol abuse Brother   . Hypertension Brother   . Kidney disease Brother   . Diabetes Brother   . Drug abuse Brother   . Mental illness Brother   . Alcohol abuse Brother   . Alcohol abuse Brother   . Hypertension Brother   . Diabetes Brother   . Alcohol abuse Brother   . Physical abuse Mother   . Alcohol abuse Mother   . Mental illness Brother   . ADD / ADHD Neg Hx   . Anxiety disorder Neg Hx   . Dementia Neg Hx   . Depression Neg Hx   . OCD Neg Hx   . Seizures Neg Hx   . Paranoid behavior Neg Hx   . Drug abuse Sister   . Alcohol  abuse Brother     Current Outpatient Prescriptions  Medication Sig Dispense Refill  . ALPRAZolam (XANAX) 0.5 MG tablet Take 1 tablet (0.5 mg total) by mouth 3 (three) times daily as needed for sleep or anxiety.  90 tablet  2  . amLODipine (NORVASC) 10 MG tablet Take 1 tablet (10 mg total) by mouth daily.  90 tablet  3  . Biotin (BIOTIN MAXIMUM STRENGTH) 5 MG CAPS Take 2 capsules by mouth daily.      . bismuth-metronidazole-tetracycline (PYLERA) 140-125-125 MG per capsule Take 3 capsules by mouth 4 (four) times daily -  before meals and at bedtime.  120 capsule  0  .   budesonide-formoterol (SYMBICORT) 160-4.5 MCG/ACT inhaler Inhale 2 puffs into the lungs 2 (two) times daily.  1 Inhaler  12  . Carbamazepine (EQUETRO) 100 MG CP12 Take one cap each morning and two at bedtime  90 capsule  1  . Cetirizine HCl 10 MG TBDP Take 1 tablet by mouth daily.  30 tablet  5  . Cholecalciferol (VITAMIN D-1000 MAX ST) 1000 UNITS tablet Take 1,000 Units by mouth daily.      . cloNIDine (CATAPRES) 0.1 MG tablet 0.1 mg. Take 1 tablet (0.1 mg total) by mouth 3 (three) times daily as needed.      . cyclobenzaprine (FLEXERIL) 10 MG tablet One tablet at bedtime for neck spasm  30 tablet  3  . diclofenac sodium (VOLTAREN) 1 % GEL Apply 2gm QID to affected area      . fentaNYL (DURAGESIC) 12 MCG/HR 1 patch. Place 1 patch onto the skin every third day.      . fentaNYL (DURAGESIC) 12 MCG/HR 1 patch. Place 1 patch onto the skin every other day for 30 days.      . fluticasone (FLONASE) 50 MCG/ACT nasal spray Place 2 sprays into the nose daily.  16 g  2  . ipratropium (ATROVENT) 0.02 % nebulizer solution inhale contents of 1 vial in nebulizer every 6 hours if needed FOR WHEEZING  62.5 mL  3  . lidocaine (XYLOCAINE) 5 % ointment Apply 1 application topically as needed.      . lubiprostone (AMITIZA) 24 MCG capsule Take 1 capsule (24 mcg total) by mouth daily with breakfast. Take 24 mcg by mouth 2 times daily with meals.  30  capsule  6  . metFORMIN (GLUCOPHAGE XR) 500 MG 24 hr tablet Take 1 tablet (500 mg total) by mouth daily with breakfast.  30 tablet  3  . montelukast (SINGULAIR) 10 MG tablet Take 1 tablet (10 mg total) by mouth at bedtime.  30 tablet  2  . Multiple Vitamin (MULTIVITAMIN WITH MINERALS) TABS Take 1 tablet by mouth daily.      . naproxen (NAPROSYN) 375 MG tablet Take by mouth. Take 1 tablet (375 mg total) by mouth 2 (two) times daily with a meal.      . Olopatadine HCl (PATADAY) 0.2 % SOLN Apply to eye. Apply 1 drop to eye daily.      . oxyCODONE (ROXICODONE) 15 MG immediate release tablet Take 15 mg by mouth every 4 (four) hours as needed. For breakthrough pain      . pantoprazole (PROTONIX) 40 MG tablet Take 1 tablet (40 mg total) by mouth daily.  30 tablet  6  . potassium chloride (K-DUR,KLOR-CON) 10 MEQ tablet Dose increase, two tablets three times daily  180 tablet  3  . predniSONE (DELTASONE) 5 MG tablet 5 mg. Take 5 mg by mouth daily.      . rizatriptan (MAXALT-MLT) 10 MG disintegrating tablet One tabet at headache onset , may repeat after 2 hours if headache persists. Maximum of 3 tablets in a 24 hour period.  Maximum use is twice weekly  10 tablet  2  . topiramate (TOPAMAX) 100 MG tablet Take 1 tablet (100 mg total) by mouth daily.  30 tablet  5  . triamterene-hydrochlorothiazide (MAXZIDE) 75-50 MG per tablet Take 1 tablet by mouth daily.  90 tablet  3   No current facility-administered medications for this visit.     Allergies  Allergen Reactions  . Neurontin [Gabapentin] Other (See Comments)    Other reaction(s):   Mental Status Changes (intolerance) hallucinations Sleep walking  . Amoxicillin Hives  . Ibuprofen     Thins blood and then requires injections to thicken blood  . Penicillins Hives  . Vicodin [Hydrocodone-Acetaminophen] Itching    Other reaction(s): Other (See Comments) Skin crawls     BP 124/84  Pulse 80  Temp(Src) 98.4 F (36.9 C) (Temporal)  Resp 14  Ht 5'  6" (1.676 m)  Wt 167 lb 6.4 oz (75.932 kg)  BMI 27.03 kg/m2  No results found.   Review of Systems  Constitutional: Negative for fever, chills, diaphoresis, appetite change and fatigue.  HENT: Negative for ear pain, sore throat, trouble swallowing, neck pain and ear discharge.   Eyes: Negative for photophobia, discharge and visual disturbance.  Respiratory: Negative for cough, choking, chest tightness and shortness of breath.   Cardiovascular: Negative for chest pain and palpitations.  Gastrointestinal: Positive for nausea, vomiting and abdominal pain. Negative for diarrhea, constipation, anal bleeding and rectal pain.  Endocrine: Negative for cold intolerance and heat intolerance.  Genitourinary: Negative for dysuria, frequency and difficulty urinating.  Musculoskeletal: Negative for myalgias and gait problem.  Skin: Negative for color change, pallor and rash.  Allergic/Immunologic: Negative for environmental allergies, food allergies and immunocompromised state.  Neurological: Positive for headaches. Negative for dizziness, speech difficulty, weakness and numbness.  Hematological: Negative for adenopathy.  Psychiatric/Behavioral: Negative for confusion and agitation. The patient is not nervous/anxious.        Objective:   Physical Exam  Constitutional: She is oriented to person, place, and time. She appears well-developed and well-nourished. No distress.  HENT:  Head: Normocephalic.  Mouth/Throat: Oropharynx is clear and moist. No oropharyngeal exudate.  Eyes: Conjunctivae and EOM are normal. Pupils are equal, round, and reactive to light. No scleral icterus.  Neck: Normal range of motion. Neck supple. No tracheal deviation present.  Cardiovascular: Normal rate, regular rhythm and intact distal pulses.   Pulmonary/Chest: Effort normal and breath sounds normal. No stridor. No respiratory distress. She exhibits no tenderness.  Abdominal: Soft. She exhibits no distension, no fluid  wave, no ascites and no mass. There is tenderness in the right lower quadrant. There is no rigidity, no guarding, no CVA tenderness, no tenderness at McBurney's point and negative Murphy's sign. No hernia. Hernia confirmed negative in the ventral area, confirmed negative in the right inguinal area and confirmed negative in the left inguinal area.    Genitourinary: No vaginal discharge found.  Musculoskeletal: Normal range of motion. She exhibits no tenderness.       Right elbow: She exhibits normal range of motion.       Left elbow: She exhibits normal range of motion.       Right wrist: She exhibits normal range of motion.       Left wrist: She exhibits normal range of motion.       Right hand: Normal strength noted.       Left hand: Normal strength noted.  Lymphadenopathy:       Head (right side): No posterior auricular adenopathy present.       Head (left side): No posterior auricular adenopathy present.    She has no cervical adenopathy.    She has no axillary adenopathy.       Right: No inguinal adenopathy present.       Left: No inguinal adenopathy present.  Neurological: She is alert and oriented to person, place, and time. No cranial nerve deficit. She exhibits normal muscle tone. Coordination normal.    Skin: Skin is warm and dry. No rash noted. She is not diaphoretic. No erythema.  Psychiatric: She has a normal mood and affect. Her speech is normal and behavior is normal. Judgment and thought content normal. Cognition and memory are normal.       Assessment:     Recurrent lower abdominal primarily right-sided pain and a history of prior lysis lesions/obstruction.  Rest of extensive gastroenterology workup negative aside from Asymptomatic H. Pylori duodenitis     Plan:     Her options are limited.  Shortly she is exhausted medical management.  Given the history very similar with relief from her prior lysis of adhesions, I think it is reasonable to do diagnostic laparoscopy and  see if I can find a transition point/area of adhesions of concern.  Probably related to her prior appendicitis / appendectomy.  Another possibility is endometriosis, but I think that is less likely given that she has not had major changes with menstruation and it would not explain nausea vomiting bloating.  Can I find any abnormalities in the pelvis, and I can biopsy the margin interoperative gynecological consult.  I doubt it will come to that.  She seems reasonable and Does not seem to have a surgical addiction where she is demanding surgery.  She is hopeful that I can provide relief, but understands it may not help or only partially help given her numerous prior surgeries and complex medical/psychiatric issues.  I spent some >60minutes discussing with her, reviewing films, reviewing studies.  The anatomy & physiology of the digestive tract was discussed.  The pathophysiology of intestinal obstruction was discussed.  Natural history risks without surgery was discussed.   Differential diagnosis was discussed  I feel the patient has failed non-operative therapies.  The risks of no intervention will lead to serious problems such as necrosis, perforation, dehydration, etc. that outweigh the operative risks; therefore, I recommended abdominal exploration to diagnose & treat the source of the problem.  Laparoscopic & open techniques were discussed.   I expressed a good likelihood that surgery will treat the problem.  Risks such as bleeding, infection, abscess, leak, reoperation, bowel resection, possible ostomy, hernia, heart attack, death, and other risks were discussed.   I noted a good likelihood this will help address the problem.  Goals of post-operative recovery were discussed as well.  We will work to minimize complications. Questions were answered.  The patient expresses understanding & wishes to proceed with surgery.   I encouraged her to continue to work to get feedback on a regimen to treat her H  pylori duodenitis from [facility].  She tells me they are going to call in a new prescription this evening.      

## 2013-07-08 DIAGNOSIS — G43109 Migraine with aura, not intractable, without status migrainosus: Secondary | ICD-10-CM | POA: Diagnosis not present

## 2013-07-08 DIAGNOSIS — F319 Bipolar disorder, unspecified: Secondary | ICD-10-CM | POA: Diagnosis not present

## 2013-07-08 DIAGNOSIS — G43709 Chronic migraine without aura, not intractable, without status migrainosus: Secondary | ICD-10-CM | POA: Diagnosis not present

## 2013-07-16 ENCOUNTER — Encounter (HOSPITAL_COMMUNITY): Payer: Self-pay | Admitting: Pharmacy Technician

## 2013-07-19 ENCOUNTER — Encounter (HOSPITAL_COMMUNITY)
Admission: RE | Admit: 2013-07-19 | Discharge: 2013-07-19 | Disposition: A | Discharge status: Home / Self Care | Payer: Managed Care, Other (non HMO) | Source: Ambulatory Visit | Attending: Surgery | Admitting: Surgery

## 2013-07-19 ENCOUNTER — Encounter (HOSPITAL_COMMUNITY): Payer: Self-pay

## 2013-07-19 ENCOUNTER — Ambulatory Visit (HOSPITAL_COMMUNITY)
Admission: RE | Admit: 2013-07-19 | Discharge: 2013-07-19 | Disposition: A | Discharge status: Home / Self Care | Payer: Managed Care, Other (non HMO) | Source: Ambulatory Visit | Attending: Surgery | Admitting: Surgery

## 2013-07-19 ENCOUNTER — Other Ambulatory Visit (HOSPITAL_COMMUNITY): Payer: Self-pay | Admitting: *Deleted

## 2013-07-19 DIAGNOSIS — Z01812 Encounter for preprocedural laboratory examination: Secondary | ICD-10-CM | POA: Insufficient documentation

## 2013-07-19 DIAGNOSIS — Z01818 Encounter for other preprocedural examination: Secondary | ICD-10-CM | POA: Diagnosis not present

## 2013-07-19 DIAGNOSIS — Z01810 Encounter for preprocedural cardiovascular examination: Secondary | ICD-10-CM | POA: Diagnosis not present

## 2013-07-19 DIAGNOSIS — I10 Essential (primary) hypertension: Secondary | ICD-10-CM | POA: Diagnosis not present

## 2013-07-19 HISTORY — DX: Anemia, unspecified: D64.9

## 2013-07-19 HISTORY — DX: Gastro-esophageal reflux disease without esophagitis: K21.9

## 2013-07-19 LAB — CBC
Hemoglobin: 13.9 g/dL (ref 12.0–15.0)
MCH: 30.6 pg (ref 26.0–34.0)
MCHC: 33.4 g/dL (ref 30.0–36.0)
RDW: 13.3 % (ref 11.5–15.5)

## 2013-07-19 LAB — BASIC METABOLIC PANEL
BUN: 15 mg/dL (ref 6–23)
Calcium: 9.8 mg/dL (ref 8.4–10.5)
Creatinine, Ser: 1.06 mg/dL (ref 0.50–1.10)
GFR calc Af Amer: 73 mL/min — ABNORMAL LOW (ref >90)
GFR calc non Af Amer: 63 mL/min — ABNORMAL LOW (ref >90)
Glucose, Bld: 93 mg/dL (ref 70–99)

## 2013-07-19 LAB — HCG, SERUM, QUALITATIVE: Preg, Serum: NEGATIVE

## 2013-07-19 NOTE — Patient Instructions (Addendum)
Jane English  07/19/2013                           YOUR PROCEDURE IS SCHEDULED ON: 07/29/13               PLEASE REPORT TO SHORT STAY CENTER AT :  7:15 AM               CALL THIS NUMBER IF ANY PROBLEMS THE DAY OF SURGERY :               [phone]                      REMEMBER:   Do not eat food or drink liquids AFTER MIDNIGHT    Take these medicines the morning of surgery with A SIP OF WATER: ALPRAZALAM / AMLODIPINE / PYLEA / CARBAMAZEPINE / ZYRTEC / CLONIDINE / FLONASE / SINGULAIR / PROTONIX /PREDNISONE / MAY USE ATROVENT INHALER / SYMBICORT / OXYCODONE IF NEEDED   Do not wear jewelry, make-up   Do not wear lotions, powders, or perfumes.   Do not shave legs or underarms 12 hrs. before surgery (men may shave face)  Do not bring valuables to the hospital.  Contacts, dentures or bridgework may not be worn into surgery.  Leave suitcase in the car. After surgery it may be brought to your room.  For patients admitted to the hospital more than one night, checkout time is 11:00                          The day of discharge.   Patients discharged the day of surgery will not be allowed to drive home                             If going home same day of surgery, must have someone stay with you first                           24 hrs at home and arrange for some one to drive you home from hospital.    Special Instructions:   Please read over the following fact sheets that you were given to you              1. [facility] PREPARING FOR SURGERY SHEET              2. DISCONTINUE ASPIRIN AND HERBAL MEDICATOINS 5 DAYS PREOP                                                X_____________________________________________________________________        Failure to follow these instructions may result in cancellation of your surgery

## 2013-07-23 DIAGNOSIS — M961 Postlaminectomy syndrome, not elsewhere classified: Secondary | ICD-10-CM | POA: Diagnosis not present

## 2013-07-23 DIAGNOSIS — G8918 Other acute postprocedural pain: Secondary | ICD-10-CM | POA: Diagnosis not present

## 2013-07-29 ENCOUNTER — Inpatient Hospital Stay (HOSPITAL_COMMUNITY): Payer: Managed Care, Other (non HMO) | Admitting: Anesthesiology

## 2013-07-29 ENCOUNTER — Inpatient Hospital Stay (HOSPITAL_COMMUNITY)
Admission: RE | Admit: 2013-07-29 | Discharge: 2013-08-03 | DRG: 749 | Disposition: A | Discharge status: Home / Self Care | Payer: Managed Care, Other (non HMO) | Source: Ambulatory Visit | Attending: Surgery | Admitting: Surgery

## 2013-07-29 ENCOUNTER — Encounter (HOSPITAL_COMMUNITY)
Admission: RE | Disposition: A | Discharge status: Home / Self Care | Payer: Self-pay | Source: Ambulatory Visit | Attending: Surgery

## 2013-07-29 ENCOUNTER — Encounter (HOSPITAL_COMMUNITY): Payer: Self-pay

## 2013-07-29 ENCOUNTER — Encounter (HOSPITAL_COMMUNITY): Payer: Self-pay | Admitting: Anesthesiology

## 2013-07-29 DIAGNOSIS — R1031 Right lower quadrant pain: Secondary | ICD-10-CM

## 2013-07-29 DIAGNOSIS — F4310 Post-traumatic stress disorder, unspecified: Secondary | ICD-10-CM | POA: Diagnosis present

## 2013-07-29 DIAGNOSIS — Z791 Long term (current) use of non-steroidal anti-inflammatories (NSAID): Secondary | ICD-10-CM

## 2013-07-29 DIAGNOSIS — K660 Peritoneal adhesions (postprocedural) (postinfection): Secondary | ICD-10-CM

## 2013-07-29 DIAGNOSIS — K56609 Unspecified intestinal obstruction, unspecified as to partial versus complete obstruction: Secondary | ICD-10-CM | POA: Diagnosis not present

## 2013-07-29 DIAGNOSIS — J45991 Cough variant asthma: Secondary | ICD-10-CM | POA: Diagnosis present

## 2013-07-29 DIAGNOSIS — Z8742 Personal history of other diseases of the female genital tract: Secondary | ICD-10-CM

## 2013-07-29 DIAGNOSIS — K2980 Duodenitis without bleeding: Secondary | ICD-10-CM | POA: Diagnosis present

## 2013-07-29 DIAGNOSIS — N736 Female pelvic peritoneal adhesions (postinfective): Principal | ICD-10-CM | POA: Diagnosis present

## 2013-07-29 DIAGNOSIS — R109 Unspecified abdominal pain: Secondary | ICD-10-CM

## 2013-07-29 DIAGNOSIS — Z880 Allergy status to penicillin: Secondary | ICD-10-CM

## 2013-07-29 DIAGNOSIS — K5909 Other constipation: Secondary | ICD-10-CM | POA: Diagnosis present

## 2013-07-29 DIAGNOSIS — F418 Other specified anxiety disorders: Secondary | ICD-10-CM | POA: Diagnosis present

## 2013-07-29 DIAGNOSIS — I10 Essential (primary) hypertension: Secondary | ICD-10-CM | POA: Diagnosis present

## 2013-07-29 DIAGNOSIS — K560 Paralytic ileus: Secondary | ICD-10-CM | POA: Diagnosis not present

## 2013-07-29 DIAGNOSIS — R7303 Prediabetes: Secondary | ICD-10-CM | POA: Diagnosis present

## 2013-07-29 DIAGNOSIS — E669 Obesity, unspecified: Secondary | ICD-10-CM | POA: Diagnosis present

## 2013-07-29 DIAGNOSIS — G43909 Migraine, unspecified, not intractable, without status migrainosus: Secondary | ICD-10-CM | POA: Diagnosis present

## 2013-07-29 DIAGNOSIS — K219 Gastro-esophageal reflux disease without esophagitis: Secondary | ICD-10-CM | POA: Diagnosis present

## 2013-07-29 DIAGNOSIS — E119 Type 2 diabetes mellitus without complications: Secondary | ICD-10-CM | POA: Diagnosis not present

## 2013-07-29 DIAGNOSIS — G894 Chronic pain syndrome: Secondary | ICD-10-CM | POA: Diagnosis present

## 2013-07-29 DIAGNOSIS — F411 Generalized anxiety disorder: Secondary | ICD-10-CM | POA: Diagnosis present

## 2013-07-29 DIAGNOSIS — G43709 Chronic migraine without aura, not intractable, without status migrainosus: Secondary | ICD-10-CM | POA: Diagnosis present

## 2013-07-29 DIAGNOSIS — A048 Other specified bacterial intestinal infections: Secondary | ICD-10-CM | POA: Diagnosis present

## 2013-07-29 DIAGNOSIS — Z01812 Encounter for preprocedural laboratory examination: Secondary | ICD-10-CM

## 2013-07-29 DIAGNOSIS — F319 Bipolar disorder, unspecified: Secondary | ICD-10-CM | POA: Diagnosis present

## 2013-07-29 DIAGNOSIS — G4700 Insomnia, unspecified: Secondary | ICD-10-CM | POA: Diagnosis present

## 2013-07-29 DIAGNOSIS — B9681 Helicobacter pylori [H. pylori] as the cause of diseases classified elsewhere: Secondary | ICD-10-CM | POA: Diagnosis present

## 2013-07-29 DIAGNOSIS — Z79899 Other long term (current) drug therapy: Secondary | ICD-10-CM

## 2013-07-29 DIAGNOSIS — J45909 Unspecified asthma, uncomplicated: Secondary | ICD-10-CM | POA: Diagnosis present

## 2013-07-29 DIAGNOSIS — Z6827 Body mass index (BMI) 27.0-27.9, adult: Secondary | ICD-10-CM

## 2013-07-29 DIAGNOSIS — Z01810 Encounter for preprocedural cardiovascular examination: Secondary | ICD-10-CM

## 2013-07-29 HISTORY — DX: Other seasonal allergic rhinitis: J30.2

## 2013-07-29 HISTORY — PX: LYSIS OF ADHESION: SHX5961

## 2013-07-29 HISTORY — DX: Unspecified asthma with (acute) exacerbation: J45.901

## 2013-07-29 HISTORY — DX: Psychotic disorder with hallucinations due to known physiological condition: F06.0

## 2013-07-29 HISTORY — PX: BOWEL RESECTION: SHX1257

## 2013-07-29 HISTORY — PX: LAPAROSCOPY: SHX197

## 2013-07-29 LAB — TYPE AND SCREEN: ABO/RH(D): AB POS

## 2013-07-29 LAB — GLUCOSE, CAPILLARY
Glucose-Capillary: 96 mg/dL (ref 70–99)
Glucose-Capillary: 118 mg/dL — ABNORMAL HIGH (ref 70–99)

## 2013-07-29 SURGERY — LAPAROSCOPY, DIAGNOSTIC
Anesthesia: General | Wound class: Clean

## 2013-07-29 MED ORDER — 0.9 % SODIUM CHLORIDE (POUR BTL) OPTIME
TOPICAL | Status: DC | PRN
  Administered 2013-07-29: 1000 mL

## 2013-07-29 MED ORDER — ESMOLOL HCL 10 MG/ML IV SOLN
INTRAVENOUS | Status: DC | PRN
  Administered 2013-07-29: 20 mg via INTRAVENOUS

## 2013-07-29 MED ORDER — PHENYLEPHRINE HCL 10 MG/ML IJ SOLN
INTRAMUSCULAR | Status: DC | PRN
  Administered 2013-07-29: 80 ug via INTRAVENOUS
  Administered 2013-07-29 (×2): 40 ug via INTRAVENOUS

## 2013-07-29 MED ORDER — CHLORHEXIDINE GLUCONATE 4 % EX LIQD: 1.0000 "application " | Freq: Once | CUTANEOUS | Status: DC

## 2013-07-29 MED ORDER — SUMATRIPTAN SUCCINATE 50 MG PO TABS
50.0000 mg | ORAL_TABLET | Freq: Two times a day (BID) | ORAL | Status: DC | PRN
  Filled 2013-07-29: qty 1

## 2013-07-29 MED ORDER — CLINDAMYCIN PHOSPHATE 900 MG/50ML IV SOLN
INTRAVENOUS | Status: AC
  Filled 2013-07-29: qty 50

## 2013-07-29 MED ORDER — BIS SUBCIT-METRONID-TETRACYC 140-125-125 MG PO CAPS: 3.0000 | ORAL_CAPSULE | Freq: Four times a day (QID) | ORAL | Status: DC

## 2013-07-29 MED ORDER — LIDOCAINE HCL (CARDIAC) 20 MG/ML IV SOLN
INTRAVENOUS | Status: DC | PRN
  Administered 2013-07-29: 50 mg via INTRAVENOUS

## 2013-07-29 MED ORDER — LACTATED RINGERS IV SOLN
INTRAVENOUS | Status: DC | PRN
  Administered 2013-07-29 (×3): via INTRAVENOUS

## 2013-07-29 MED ORDER — LACTATED RINGERS IV SOLN: INTRAVENOUS | Status: DC

## 2013-07-29 MED ORDER — GLYCOPYRROLATE 0.2 MG/ML IJ SOLN
INTRAMUSCULAR | Status: DC | PRN
  Administered 2013-07-29: .7 mg via INTRAVENOUS

## 2013-07-29 MED ORDER — ALUM & MAG HYDROXIDE-SIMETH 200-200-20 MG/5ML PO SUSP
30.0000 mL | Freq: Four times a day (QID) | ORAL | Status: DC | PRN
  Administered 2013-08-03: 30 mL via ORAL
  Filled 2013-07-29: qty 30

## 2013-07-29 MED ORDER — LACTATED RINGERS IR SOLN
Status: DC | PRN
  Administered 2013-07-29: 3000 mL

## 2013-07-29 MED ORDER — PREDNISONE 5 MG PO TABS
5.0000 mg | ORAL_TABLET | Freq: Every day | ORAL | Status: DC
  Administered 2013-07-30 – 2013-08-03 (×5): 5 mg via ORAL
  Filled 2013-07-29 (×7): qty 1

## 2013-07-29 MED ORDER — SODIUM CHLORIDE 0.9 % IV SOLN: 250.0000 mL | INTRAVENOUS | Status: DC | PRN

## 2013-07-29 MED ORDER — RIZATRIPTAN BENZOATE 10 MG PO TBDP: 10.0000 mg | ORAL_TABLET | Freq: Two times a day (BID) | ORAL | Status: DC | PRN

## 2013-07-29 MED ORDER — FLUTICASONE PROPIONATE 50 MCG/ACT NA SUSP
2.0000 | Freq: Every day | NASAL | Status: DC
  Administered 2013-07-29 – 2013-08-02 (×5): 2 via NASAL
  Filled 2013-07-29: qty 16

## 2013-07-29 MED ORDER — LUBIPROSTONE 24 MCG PO CAPS
24.0000 ug | ORAL_CAPSULE | Freq: Two times a day (BID) | ORAL | Status: DC
  Administered 2013-07-30 – 2013-08-03 (×9): 24 ug via ORAL
  Filled 2013-07-29 (×13): qty 1

## 2013-07-29 MED ORDER — FENTANYL 12 MCG/HR TD PT72
12.5000 ug | MEDICATED_PATCH | TRANSDERMAL | Status: DC
  Administered 2013-07-29 – 2013-07-31 (×2): 12.5 ug via TRANSDERMAL
  Filled 2013-07-29 (×2): qty 1

## 2013-07-29 MED ORDER — HYDROMORPHONE HCL PF 1 MG/ML IJ SOLN
0.2500 mg | INTRAMUSCULAR | Status: DC | PRN
  Administered 2013-07-29: 0.25 mg via INTRAVENOUS

## 2013-07-29 MED ORDER — PROPOFOL 10 MG/ML IV BOLUS
INTRAVENOUS | Status: DC | PRN
  Administered 2013-07-29: 40 mg via INTRAVENOUS
  Administered 2013-07-29: 180 mg via INTRAVENOUS

## 2013-07-29 MED ORDER — OXYCODONE HCL 5 MG PO TABS
15.0000 mg | ORAL_TABLET | ORAL | Status: DC | PRN
  Administered 2013-07-30 – 2013-08-01 (×7): 15 mg via ORAL
  Filled 2013-07-29 (×7): qty 3

## 2013-07-29 MED ORDER — ALVIMOPAN 12 MG PO CAPS
12.0000 mg | ORAL_CAPSULE | Freq: Once | ORAL | Status: AC
  Administered 2013-07-29: 12 mg via ORAL
  Filled 2013-07-29: qty 1

## 2013-07-29 MED ORDER — ROCURONIUM BROMIDE 100 MG/10ML IV SOLN
INTRAVENOUS | Status: DC | PRN
  Administered 2013-07-29: 50 mg via INTRAVENOUS

## 2013-07-29 MED ORDER — IPRATROPIUM BROMIDE 0.02 % IN SOLN: 500.0000 ug | Freq: Four times a day (QID) | RESPIRATORY_TRACT | Status: DC | PRN

## 2013-07-29 MED ORDER — ADULT MULTIVITAMIN W/MINERALS CH
1.0000 | ORAL_TABLET | Freq: Every day | ORAL | Status: DC
  Administered 2013-07-30 – 2013-08-02 (×4): 1 via ORAL
  Filled 2013-07-29 (×6): qty 1

## 2013-07-29 MED ORDER — LIP MEDEX EX OINT
1.0000 "application " | TOPICAL_OINTMENT | Freq: Two times a day (BID) | CUTANEOUS | Status: DC
  Administered 2013-07-29 – 2013-08-02 (×6): 1 via TOPICAL

## 2013-07-29 MED ORDER — POLYETHYLENE GLYCOL 3350 17 G PO PACK
17.0000 g | PACK | Freq: Two times a day (BID) | ORAL | Status: DC
  Administered 2013-07-29 – 2013-08-02 (×8): 17 g via ORAL
  Filled 2013-07-29 (×11): qty 1

## 2013-07-29 MED ORDER — HEPARIN SODIUM (PORCINE) 5000 UNIT/ML IJ SOLN
5000.0000 [IU] | Freq: Three times a day (TID) | INTRAMUSCULAR | Status: DC
  Administered 2013-07-30 – 2013-08-02 (×12): 5000 [IU] via SUBCUTANEOUS
  Filled 2013-07-29 (×16): qty 1

## 2013-07-29 MED ORDER — METOCLOPRAMIDE HCL 5 MG/ML IJ SOLN
INTRAMUSCULAR | Status: DC | PRN
  Administered 2013-07-29: 10 mg via INTRAVENOUS

## 2013-07-29 MED ORDER — BUPIVACAINE-EPINEPHRINE (PF) 0.25% -1:200000 IJ SOLN
INTRAMUSCULAR | Status: DC | PRN
  Administered 2013-07-29: 60 mL

## 2013-07-29 MED ORDER — SODIUM CHLORIDE 0.9 % IJ SOLN: 3.0000 mL | INTRAMUSCULAR | Status: DC | PRN

## 2013-07-29 MED ORDER — MAGIC MOUTHWASH
15.0000 mL | Freq: Four times a day (QID) | ORAL | Status: DC | PRN
  Filled 2013-07-29: qty 15

## 2013-07-29 MED ORDER — ACETAMINOPHEN 10 MG/ML IV SOLN
1000.0000 mg | Freq: Four times a day (QID) | INTRAVENOUS | Status: AC
  Administered 2013-07-29 – 2013-07-30 (×4): 1000 mg via INTRAVENOUS
  Filled 2013-07-29 (×4): qty 100

## 2013-07-29 MED ORDER — DIPHENHYDRAMINE HCL 50 MG/ML IJ SOLN: 12.5000 mg | Freq: Four times a day (QID) | INTRAMUSCULAR | Status: DC | PRN

## 2013-07-29 MED ORDER — HYDROMORPHONE HCL PF 1 MG/ML IJ SOLN
INTRAMUSCULAR | Status: DC | PRN
  Administered 2013-07-29: 1 mg via INTRAVENOUS
  Administered 2013-07-29 (×2): 0.5 mg via INTRAVENOUS

## 2013-07-29 MED ORDER — TETRACYCLINE HCL 250 MG PO CAPS
500.0000 mg | ORAL_CAPSULE | Freq: Four times a day (QID) | ORAL | Status: DC
  Administered 2013-07-29 – 2013-08-02 (×17): 500 mg via ORAL
  Filled 2013-07-29 (×22): qty 2

## 2013-07-29 MED ORDER — SODIUM CHLORIDE 0.9 % IJ SOLN
INTRAMUSCULAR | Status: AC
  Filled 2013-07-29: qty 3

## 2013-07-29 MED ORDER — MONTELUKAST SODIUM 10 MG PO TABS
10.0000 mg | ORAL_TABLET | Freq: Every day | ORAL | Status: DC
  Administered 2013-07-29 – 2013-08-02 (×5): 10 mg via ORAL
  Filled 2013-07-29 (×6): qty 1

## 2013-07-29 MED ORDER — MIDAZOLAM HCL 5 MG/5ML IJ SOLN
INTRAMUSCULAR | Status: DC | PRN
  Administered 2013-07-29: 1 mg via INTRAVENOUS
  Administered 2013-07-29: 2 mg via INTRAVENOUS
  Administered 2013-07-29: 1 mg via INTRAVENOUS

## 2013-07-29 MED ORDER — LACTATED RINGERS IV BOLUS (SEPSIS): 1000.0000 mL | Freq: Three times a day (TID) | INTRAVENOUS | Status: AC | PRN

## 2013-07-29 MED ORDER — AMLODIPINE BESYLATE 10 MG PO TABS
10.0000 mg | ORAL_TABLET | Freq: Every morning | ORAL | Status: DC
  Administered 2013-07-30 – 2013-08-02 (×4): 10 mg via ORAL
  Filled 2013-07-29 (×5): qty 1

## 2013-07-29 MED ORDER — OLOPATADINE HCL 0.1 % OP SOLN
1.0000 [drp] | Freq: Two times a day (BID) | OPHTHALMIC | Status: DC
  Filled 2013-07-29: qty 5

## 2013-07-29 MED ORDER — METRONIDAZOLE 500 MG PO TABS
500.0000 mg | ORAL_TABLET | Freq: Four times a day (QID) | ORAL | Status: DC
  Administered 2013-07-29 – 2013-08-02 (×17): 500 mg via ORAL
  Filled 2013-07-29 (×21): qty 1

## 2013-07-29 MED ORDER — NAPROXEN 500 MG PO TABS
500.0000 mg | ORAL_TABLET | Freq: Two times a day (BID) | ORAL | Status: DC
  Filled 2013-07-29: qty 1

## 2013-07-29 MED ORDER — CISATRACURIUM BESYLATE (PF) 10 MG/5ML IV SOLN
INTRAVENOUS | Status: DC | PRN
  Administered 2013-07-29: 3 mg via INTRAVENOUS
  Administered 2013-07-29: 2 mg via INTRAVENOUS

## 2013-07-29 MED ORDER — HYDROMORPHONE HCL PF 1 MG/ML IJ SOLN
INTRAMUSCULAR | Status: AC
  Filled 2013-07-29: qty 1

## 2013-07-29 MED ORDER — NALOXONE HCL 0.4 MG/ML IJ SOLN
INTRAMUSCULAR | Status: DC | PRN
  Administered 2013-07-29 (×2): 4 ug via INTRAVENOUS

## 2013-07-29 MED ORDER — TOPIRAMATE 100 MG PO TABS
100.0000 mg | ORAL_TABLET | Freq: Every day | ORAL | Status: DC
  Administered 2013-07-29 – 2013-08-02 (×5): 100 mg via ORAL
  Filled 2013-07-29 (×6): qty 1

## 2013-07-29 MED ORDER — NAPROXEN 500 MG PO TABS
500.0000 mg | ORAL_TABLET | Freq: Two times a day (BID) | ORAL | Status: DC | PRN
  Filled 2013-07-29: qty 1

## 2013-07-29 MED ORDER — METFORMIN HCL ER 500 MG PO TB24
500.0000 mg | ORAL_TABLET | Freq: Every day | ORAL | Status: DC
  Administered 2013-07-30 – 2013-08-03 (×5): 500 mg via ORAL
  Filled 2013-07-29 (×7): qty 1

## 2013-07-29 MED ORDER — BIOTIN 5 MG PO CAPS: 10.0000 mg | ORAL_CAPSULE | Freq: Every day | ORAL | Status: DC

## 2013-07-29 MED ORDER — DICLOFENAC SODIUM 1 % TD GEL
2.0000 g | Freq: Four times a day (QID) | TRANSDERMAL | Status: DC | PRN
  Filled 2013-07-29: qty 100

## 2013-07-29 MED ORDER — BISMUTH SUBSALICYLATE 262 MG PO CHEW
524.0000 mg | CHEWABLE_TABLET | Freq: Four times a day (QID) | ORAL | Status: DC
  Administered 2013-07-30 – 2013-08-02 (×16): 524 mg via ORAL
  Filled 2013-07-29 (×22): qty 2

## 2013-07-29 MED ORDER — ALPRAZOLAM 0.5 MG PO TABS: 0.5000 mg | ORAL_TABLET | Freq: Three times a day (TID) | ORAL | Status: DC | PRN

## 2013-07-29 MED ORDER — ONDANSETRON HCL 4 MG/2ML IJ SOLN
INTRAMUSCULAR | Status: DC | PRN
  Administered 2013-07-29: 4 mg via INTRAVENOUS

## 2013-07-29 MED ORDER — PROMETHAZINE HCL 25 MG/ML IJ SOLN
12.5000 mg | Freq: Four times a day (QID) | INTRAMUSCULAR | Status: DC | PRN
  Administered 2013-07-29: 25 mg via INTRAVENOUS
  Administered 2013-07-30 – 2013-08-01 (×2): 12.5 mg via INTRAVENOUS
  Filled 2013-07-29 (×3): qty 1

## 2013-07-29 MED ORDER — SACCHAROMYCES BOULARDII 250 MG PO CAPS
250.0000 mg | ORAL_CAPSULE | Freq: Two times a day (BID) | ORAL | Status: DC
  Administered 2013-07-29 – 2013-08-02 (×9): 250 mg via ORAL
  Filled 2013-07-29 (×12): qty 1

## 2013-07-29 MED ORDER — LACTATED RINGERS IV SOLN
INTRAVENOUS | Status: DC
  Administered 2013-07-29 – 2013-07-30 (×2): via INTRAVENOUS

## 2013-07-29 MED ORDER — GENTAMICIN SULFATE 40 MG/ML IJ SOLN
5.0000 mg/kg | INTRAVENOUS | Status: AC
  Administered 2013-07-29: 380 mg via INTRAVENOUS
  Filled 2013-07-29: qty 9.5

## 2013-07-29 MED ORDER — NEOSTIGMINE METHYLSULFATE 1 MG/ML IJ SOLN
INTRAMUSCULAR | Status: DC | PRN
  Administered 2013-07-29: 4.5 mg via INTRAVENOUS

## 2013-07-29 MED ORDER — SODIUM CHLORIDE 0.9 % IJ SOLN
3.0000 mL | Freq: Two times a day (BID) | INTRAMUSCULAR | Status: DC
  Administered 2013-07-29 – 2013-08-02 (×3): 3 mL via INTRAVENOUS

## 2013-07-29 MED ORDER — CLONIDINE HCL 0.1 MG PO TABS
0.1000 mg | ORAL_TABLET | Freq: Three times a day (TID) | ORAL | Status: DC | PRN
  Filled 2013-07-29: qty 1

## 2013-07-29 MED ORDER — BUDESONIDE-FORMOTEROL FUMARATE 160-4.5 MCG/ACT IN AERO
2.0000 | INHALATION_SPRAY | Freq: Two times a day (BID) | RESPIRATORY_TRACT | Status: DC
  Administered 2013-07-29 – 2013-08-03 (×10): 2 via RESPIRATORY_TRACT
  Filled 2013-07-29: qty 6

## 2013-07-29 MED ORDER — TRIAMTERENE-HCTZ 75-50 MG PO TABS
1.0000 | ORAL_TABLET | Freq: Every day | ORAL | Status: DC
  Administered 2013-07-30 – 2013-08-02 (×4): 1 via ORAL
  Filled 2013-07-29 (×6): qty 1

## 2013-07-29 MED ORDER — PANTOPRAZOLE SODIUM 40 MG PO TBEC
40.0000 mg | DELAYED_RELEASE_TABLET | Freq: Every day | ORAL | Status: DC
  Administered 2013-07-30 – 2013-08-02 (×4): 40 mg via ORAL
  Filled 2013-07-29 (×5): qty 1

## 2013-07-29 MED ORDER — CYCLOBENZAPRINE HCL 10 MG PO TABS
10.0000 mg | ORAL_TABLET | Freq: Three times a day (TID) | ORAL | Status: DC | PRN
  Filled 2013-07-29: qty 1

## 2013-07-29 MED ORDER — CLINDAMYCIN PHOSPHATE 900 MG/50ML IV SOLN
900.0000 mg | INTRAVENOUS | Status: AC
  Administered 2013-07-29: 900 mg via INTRAVENOUS

## 2013-07-29 MED ORDER — LACTATED RINGERS IV SOLN
INTRAVENOUS | Status: DC
  Administered 2013-07-29: 1000 mL via INTRAVENOUS
  Administered 2013-07-29: 13:00:00 via INTRAVENOUS

## 2013-07-29 MED ORDER — BUPIVACAINE-EPINEPHRINE PF 0.25-1:200000 % IJ SOLN
INTRAMUSCULAR | Status: AC
  Filled 2013-07-29: qty 90

## 2013-07-29 MED ORDER — LIDOCAINE 5 % EX OINT
1.0000 "application " | TOPICAL_OINTMENT | CUTANEOUS | Status: DC | PRN
  Filled 2013-07-29: qty 35.44

## 2013-07-29 MED ORDER — LORATADINE 10 MG PO TABS
10.0000 mg | ORAL_TABLET | Freq: Every day | ORAL | Status: DC
  Administered 2013-07-30 – 2013-08-02 (×4): 10 mg via ORAL
  Filled 2013-07-29 (×6): qty 1

## 2013-07-29 MED ORDER — POTASSIUM CHLORIDE CRYS ER 20 MEQ PO TBCR
20.0000 meq | EXTENDED_RELEASE_TABLET | Freq: Two times a day (BID) | ORAL | Status: DC
  Administered 2013-07-29 – 2013-08-02 (×9): 20 meq via ORAL
  Filled 2013-07-29 (×11): qty 1

## 2013-07-29 MED ORDER — HEPARIN SODIUM (PORCINE) 5000 UNIT/ML IJ SOLN
5000.0000 [IU] | Freq: Once | INTRAMUSCULAR | Status: AC
  Administered 2013-07-29: 5000 [IU] via SUBCUTANEOUS
  Filled 2013-07-29: qty 1

## 2013-07-29 MED ORDER — SUFENTANIL CITRATE 50 MCG/ML IV SOLN
INTRAVENOUS | Status: DC | PRN
  Administered 2013-07-29 (×2): 5 ug via INTRAVENOUS
  Administered 2013-07-29: 10 ug via INTRAVENOUS
  Administered 2013-07-29 (×2): 5 ug via INTRAVENOUS
  Administered 2013-07-29 (×2): 10 ug via INTRAVENOUS
  Administered 2013-07-29 (×2): 5 ug via INTRAVENOUS
  Administered 2013-07-29: 10 ug via INTRAVENOUS
  Administered 2013-07-29 (×3): 5 ug via INTRAVENOUS

## 2013-07-29 MED ORDER — CARBAMAZEPINE ER 100 MG PO TB12
100.0000 mg | ORAL_TABLET | Freq: Two times a day (BID) | ORAL | Status: DC
  Administered 2013-07-29: 100 mg via ORAL
  Filled 2013-07-29 (×3): qty 1

## 2013-07-29 MED ORDER — HYDROMORPHONE HCL PF 1 MG/ML IJ SOLN
0.5000 mg | INTRAMUSCULAR | Status: DC | PRN
  Administered 2013-07-29: 2 mg via INTRAVENOUS
  Filled 2013-07-29: qty 2
  Filled 2013-07-29: qty 1

## 2013-07-29 SURGICAL SUPPLY — 64 items
APPLICATOR COTTON TIP 6IN STRL (MISCELLANEOUS) IMPLANT
APPLIER CLIP ROT 10 11.4 M/L (STAPLE)
BLADE EXTENDED COATED 6.5IN (ELECTRODE) IMPLANT
BLADE HEX COATED 2.75 (ELECTRODE) ×2 IMPLANT
BLADE SURG SZ10 CARB STEEL (BLADE) IMPLANT
CANISTER SUCTION 2500CC (MISCELLANEOUS) ×2 IMPLANT
CATH FOLEY LATEX FREE 16FR (CATHETERS) ×2 IMPLANT
CLIP APPLIE ROT 10 11.4 M/L (STAPLE) IMPLANT
CLIP TI LARGE 6 (CLIP) IMPLANT
CLOTH BEACON ORANGE TIMEOUT ST (SAFETY) ×2 IMPLANT
CORD HIGH FREQUENCY UNIPOLAR (ELECTROSURGICAL) ×2 IMPLANT
COVER MAYO STAND STRL (DRAPES) ×2 IMPLANT
DECANTER SPIKE VIAL GLASS SM (MISCELLANEOUS) IMPLANT
DRAPE LAPAROSCOPIC ABDOMINAL (DRAPES) ×2 IMPLANT
DRAPE LG THREE QUARTER DISP (DRAPES) IMPLANT
DRAPE UTILITY 15X26 (DRAPE) ×2 IMPLANT
DRAPE WARM FLUID 44X44 (DRAPE) ×2 IMPLANT
DRSG PAD ABDOMINAL 8X10 ST (GAUZE/BANDAGES/DRESSINGS) IMPLANT
DRSG TEGADERM 2-3/8X2-3/4 SM (GAUZE/BANDAGES/DRESSINGS) ×8 IMPLANT
DRSG TEGADERM 4X4.75 (GAUZE/BANDAGES/DRESSINGS) ×2 IMPLANT
ELECT REM PT RETURN 9FT ADLT (ELECTROSURGICAL) ×2
ELECTRODE REM PT RTRN 9FT ADLT (ELECTROSURGICAL) ×1 IMPLANT
GAUZE SPONGE 2X2 8PLY STRL LF (GAUZE/BANDAGES/DRESSINGS) ×1 IMPLANT
GLOVE BIOGEL PI IND STRL 7.0 (GLOVE) ×1 IMPLANT
GLOVE BIOGEL PI INDICATOR 7.0 (GLOVE) ×1
GLOVE ECLIPSE 8.0 STRL XLNG CF (GLOVE) ×4 IMPLANT
GLOVE INDICATOR 8.0 STRL GRN (GLOVE) ×2 IMPLANT
GOWN PREVENTION PLUS LG XLONG (DISPOSABLE) ×2 IMPLANT
GOWN STRL NON-REIN LRG LVL3 (GOWN DISPOSABLE) ×2 IMPLANT
GOWN STRL REIN XL XLG (GOWN DISPOSABLE) ×12 IMPLANT
KIT BASIN OR (CUSTOM PROCEDURE TRAY) ×2 IMPLANT
LEGGING LITHOTOMY PAIR STRL (DRAPES) ×2 IMPLANT
LIGASURE IMPACT 36 18CM CVD LR (INSTRUMENTS) IMPLANT
NS IRRIG 1000ML POUR BTL (IV SOLUTION) ×4 IMPLANT
PACK GENERAL/GYN (CUSTOM PROCEDURE TRAY) ×2 IMPLANT
SCALPEL HARMONIC ACE (MISCELLANEOUS) IMPLANT
SCISSORS LAP 5X35 DISP (ENDOMECHANICALS) ×2 IMPLANT
SET IRRIG TUBING LAPAROSCOPIC (IRRIGATION / IRRIGATOR) ×2 IMPLANT
SLEEVE Z-THREAD 5X100MM (TROCAR) ×6 IMPLANT
SOLUTION ANTI FOG 6CC (MISCELLANEOUS) ×2 IMPLANT
SPONGE GAUZE 2X2 STER 10/PKG (GAUZE/BANDAGES/DRESSINGS) ×1
SPONGE GAUZE 4X4 12PLY (GAUZE/BANDAGES/DRESSINGS) IMPLANT
STAPLER VISISTAT 35W (STAPLE) IMPLANT
SUCTION POOLE TIP (SUCTIONS) IMPLANT
SUT MNCRL AB 4-0 PS2 18 (SUTURE) ×2 IMPLANT
SUT PDS AB 1 CTX 36 (SUTURE) IMPLANT
SUT PROLENE 2 0 KS (SUTURE) IMPLANT
SUT SILK 2 0 (SUTURE)
SUT SILK 2 0 SH CR/8 (SUTURE) IMPLANT
SUT SILK 2 0SH CR/8 30 (SUTURE) IMPLANT
SUT SILK 2-0 18XBRD TIE 12 (SUTURE) IMPLANT
SUT SILK 2-0 30XBRD TIE 12 (SUTURE) IMPLANT
SUT SILK 3 0 (SUTURE)
SUT SILK 3 0 SH CR/8 (SUTURE) IMPLANT
SUT SILK 3-0 18XBRD TIE 12 (SUTURE) IMPLANT
SUT VIC AB 3-0 SH 27 (SUTURE) ×1
SUT VIC AB 3-0 SH 27XBRD (SUTURE) ×1 IMPLANT
TOWEL OR 17X26 10 PK STRL BLUE (TOWEL DISPOSABLE) ×4 IMPLANT
TOWEL OR NON WOVEN STRL DISP B (DISPOSABLE) ×2 IMPLANT
TRAY FOLEY CATH 14FRSI W/METER (CATHETERS) ×2 IMPLANT
TRAY LAP CHOLE (CUSTOM PROCEDURE TRAY) ×2 IMPLANT
TROCAR BLADELESS OPT 5 100 (ENDOMECHANICALS) ×2 IMPLANT
TUBING INSUFFLATION 10FT LAP (TUBING) ×2 IMPLANT
YANKAUER SUCT BULB TIP NO VENT (SUCTIONS) IMPLANT

## 2013-07-29 NOTE — Anesthesia Postprocedure Evaluation (Signed)
  Anesthesia Post-op Note  Patient: Jane English  Procedure(s) Performed: Procedure(s) (LRB): diagnostic laporoscopy (N/A) LYSIS OF ADHESION (N/A) serosal repair (N/A)  Patient Location: PACU  Anesthesia Type: General  Level of Consciousness: awake and alert   Airway and Oxygen Therapy: Patient Spontanous Breathing  Post-op Pain: mild  Post-op Assessment: Post-op Vital signs reviewed, [name] Cardiovascular Status Stable, Respiratory Function Stable, Patent Airway and No signs of Nausea or vomiting  Last Vitals:  Filed Vitals:   07/29/13 1521  BP: 130/67  Pulse: 71  Temp: 36.3 C  Resp: 13    Post-op Vital Signs: stable   Complications: No apparent anesthesia complications

## 2013-07-29 NOTE — Op Note (Signed)
07/29/2013  2:02 PM  PATIENT:  Jane English  42 y.o. female  Patient Care Team: Kerri Perches, MD as PCP - General Ardeth Sportsman, MD as Consulting Physician (General Surgery) Karn Cassis, MD as Consulting Physician (Neurosurgery) Beverley Fiedler, MD as Consulting Physician (Gastroenterology)  PRE-OPERATIVE DIAGNOSIS:   Recurrent abdominal pain.  Possible partial small bowel obstruction due to adhesions  POST-OPERATIVE DIAGNOSIS:  recurrent abdominal pain,extensive adhesions  PROCEDURE:  Procedure(s): diagnostic laporoscopy LYSIS OF ADHESIONS serosal repair  SURGEON:  Surgeon(s): Ardeth Sportsman, MD Kandis Cocking, MD - Asst  ANESTHESIA:   local and general  EBL:  Total I/O In: 3000 [I.V.:3000] Out: 820 [Urine:800; Blood:20]  Delay start of Pharmacological VTE agent (>24hrs) due to surgical blood loss or risk of bleeding:  no  DRAINS: none   SPECIMEN:  No Specimen  DISPOSITION OF SPECIMEN:  N/A  COUNTS:  YES  PLAN OF CARE: Admit for overnight observation  PATIENT DISPOSITION:  PACU - hemodynamically stable.  INDICATION: Pleasant woman with chronic abdominal pain and other chronic pain issues.  Has had prior lower abdominal surgeries.  Has chronic recurrent intense abdominal pain, especially on the right side.  Gynecology and gastroenterology workup otherwise negative.  She does have a history of prior small bowel obstruction requiring open lysis of adhesions 10 years ago.  The abdominal symptoms are similar to that episode.  I offered diagnostic laparoscopy with lysis of adhesions and possible bowel resection.  Technique risks benefits alternatives were discussed.  Risks such as bleeding, bowel injury, fistula, ostomy, wound infection, abscess, death, stroke, heart attack, etc.; were discussed.  Questions answered.  She understands this may not solve her abdominal complaints or symptoms but wishes to try since everything else has been exhausted.  She wished to  proceed with surgery.  OR FINDINGS: She had some moderate omental and small bowel adhesions to the anterior abdominal wall.  No evidence of any incisional inguinal or umbilical hernias.  She had dense interloop adhesions of small bowel, mostly thin.  She had dense adhesions of greater omentum to the small intestine.  She had some moderate adhesions of small bowel to the left ovary and left colon, especially sigmoid.  These latter two areas of an epiploic appendage two small bowel mesentery and left ovary two small bowel mesentery where the most likely areas for internal herniation.  She has a normal size uterus.  She is missing her right fallopian tube/ovary.  She has a small complex cyst on her left ovary.  No infection area no abscess.  Rest of the viscera are normal.  DESCRIPTION: Informed consent was confirmed.  The patient underwent general anaesthesia without difficulty.  The patient was positioned appropriately.  VTE prevention in place.  The patient's abdomen was clipped, prepped, & draped in a sterile fashion.  Surgical timeout confirmed our plan.  The patient was positioned in reverse Trendelenburg.  Abdominal entry with a 5mm laparoscopic port was gained using optical entry technique in the left upper abdomen.  Entry was clean.  I induced carbon dioxide insufflation.  Camera inspection revealed no injury.  She had moderate adhesions of greater omentum to the central abdomen as well down into the anterior pelvis.  I placed extra ports under direct laparoscopic visualization.  I lysed adhesions of greater omentum off the anterior abdominal wall and small bowel.  This was primarily with cold scissors.  Occasionally his focus scissors and focused cautery scissors on omentum only.  Once that  was freed off, I began to try and run the small bowel more proximally.  I started the ileocecal valve.  Appendix was not there.  The distal 3 feet of ileum were normal without any adhesions.  I then encountered  more adhesions that were rather dense.  The greater omentum had dense adhesions to the small bowel serosa & mesentery.  I carefully elevated and freed this off using cold scissors.  I did encounter one area of serosa that I did a serosal repair using 3-0 Vicryl interrupted intracorporeal suturing.  Eventually, the greater omentum was released off.  The small bowel was freed off the left colon.  There was a significant band in the mid sigmoid colon going to the base of the ileal mesentery.  I encountered another dense band between the left ovary and the ileal mesentery and epiploic appendages of the distal sigmoid colon.  These were carefully freed off.  I spent most of the adhesionolysis releasing interloop adhesions between the small bowel loops.  Many areas where just thick mats of small bowel.  This took the most time.  Adhesionolysis took three hours.  At the end, I ran the small bowel twice from the ileocecal valve to ligament of Treitz.  No other injury or abnormality seen.  I did copious irrigation.  I freed the greater omentum off its adhesions to the liver edge and gallbladder and left upper quadrant.  Therefore, the released greater omentum could fall down and cover up most of the small intestine.  I assured hemostasis.  I did copious urination over 3 L.  Hemostasis was excellent.  I ran the small bowel again.  I inspected the colon from cecum to the rectal peritoneal reflection.  No evidence of injury or other abnormalites.  I evacuated carbon dioxide removed the ports.  I closed skin with 4 Monocryl.  Sterile dressings applied.  Patient is extubated.  About discussed operative findings with the patient's family.  Hopefully this will help her.

## 2013-07-29 NOTE — Interval H&P Note (Signed)
History and Physical Interval Note:  07/29/2013 9:55 AM  Jane English  has presented today for surgery, with the diagnosis of  Recurrent abdominal pain.  Possible partial small bowel obstruction due to adhesions  The various methods of treatment have been discussed with the patient and family. After consideration of risks, benefits and other options for treatment, the patient has consented to  Procedure(s): LAPAROSCOPY DIAGNOSTIC(POSSIBLE OPEN) (N/A) LYSIS OF ADHESION (N/A) LOW ANTERIOR BOWEL RESECTION (N/A) as a surgical intervention .  The patient's history has been reviewed, patient examined, no change in status, stable for surgery.  I have reviewed the patient's chart and labs.  Questions were answered to the patient's satisfaction.     Jane English C.

## 2013-07-29 NOTE — Progress Notes (Signed)
PHARMACIST - PHYSICIAN ORDER COMMUNICATION  CONCERNING: P&T Medication Policy on Herbal Medications  DESCRIPTION:  This patient's order for:  Biotin has been noted.  This product(s) is classified as an "herbal" or natural product. Due to a lack of definitive safety studies or FDA approval, nonstandard manufacturing practices, plus the potential risk of unknown drug-drug interactions while on inpatient medications, the Pharmacy and Therapeutics Committee does not permit the use of "herbal" or natural products of this type within [facility].   ACTION TAKEN: The pharmacy department is unable to verify this order at this time and your patient has been informed of this safety policy. Please reevaluate patient's clinical condition at discharge and address if the herbal or natural product(s) should be resumed at that time.   Juliette Alcide, PharmD, BCPS.   Pager: [phone]  07/29/2013 4:31 PM

## 2013-07-29 NOTE — H&P (View-Only) (Signed)
Subjective:     Patient ID: Jane English, female   DOB: February 22, 1971, 42 y.o.   MRN: [id number]  HPI  Jane English  1971/02/25 [id number]  Patient Care Team: Kerri Perches, MD as PCP - General Ardeth Sportsman, MD as Consulting Physician (General Surgery) Karn Cassis, MD as Consulting Physician (Neurosurgery) Beverley Fiedler, MD as Consulting Physician (Gastroenterology)  This patient is a 42 y.o.female who presents today for surgical evaluation at the request of Dr. Rhea Belton.   Reason for visit: Lower abdominal pain.  Concern of recurrent adhesions/partial obstruction  Pleasant woman with numerous health issues.  Has had prior surgeries.  She had episodes of severe right lower quadrant crampy abdominal pain.  Underwent open lysis of adhesions in 2003.  Found to have an internal hernia from prior lesions.  Her symptoms resolved.  She been doing relatively well until this year when she started getting a similar pain again.  Has had episodes of nausea and bloating.  Has had a few episodes of vomiting.  She describes the pain as colicky.  It moves into her lower abdomen.  Some occasional sharp tugging pain as well.  Not worse with activity.  Usually worse at the end of the day.  No vaginal bleeding or discharge.  She does feel abdominal pain with her periods but not markedly worse than.  Known prior history of endometriosis that she knows of.  Gastroenterology consultation was made.  Upper endoscopy revealed duodenitis that is H. Pylori positive.  She has chronic reflux which has been controlled with PPI.  She is in the process of trying to get H. Pylori treatment.  Some issues with her insurance company covering the recommended regimen given her penicillin allergy.  She had a colonoscopy which noted lymphoid polyp but no other abnormalities.  She had struggled with constipation but since starting on Amitiza she now has about three bowel movements a day.  Ultrasound reveals no gallstones.  HIDA  scan revealed normal gallbladder ejection fraction and no reproduction of pain.  Does have reactive airway disease/arthritis controlled with inhalers.  She can walk about 30 minutes before she has to stop.  Patient Active Problem List   Diagnosis Date Noted  . H. pylori duodenitis 07/07/2013  . Colicky RLQ abdominal pain, probable chronic PSBO 07/07/2013  . Asthma flare 04/09/2013  . PTSD (post-traumatic stress disorder) 03/26/2013  . Chronic constipation 03/23/2013  . CNS disorder 03/22/2013  . Chronic migraine without aura 03/18/2013  . Insomnia 03/18/2013  . Swelling of upper lip 03/11/2013  . Rash and nonspecific skin eruption 03/11/2013  . Abnormal weight loss 02/28/2013  . Phlebitis 02/28/2013  . Hypokalemia 02/13/2013  . Nausea alone 02/09/2013  . Seasonal allergies 12/10/2012  . Routine general medical examination at a health care facility 11/04/2012  . GERD (gastroesophageal reflux disease) 10/29/2012  . Chronic pain syndrome 10/06/2012  . Postlaminectomy syndrome 10/06/2012  . Onychomycosis 09/02/2012  . Cervical neck pain with evidence of disc disease 04/13/2012  . Neck muscle spasm 12/31/2011  . Anxiety state, unspecified 08/30/2010  . ALLERGIC RHINITIS CAUSE UNSPECIFIED 04/17/2010  . PSYCHOTIC D/O W/HALLUCINATIONS CONDS CLASS ELSW 03/04/2010  . INTRINSIC ASTHMA, UNSPECIFIED 03/04/2010  . BACK PAIN WITH RADICULOPATHY 03/09/2009  . DEPRESSION, CHRONIC 01/08/2009  . IGT (impaired glucose tolerance) 01/05/2009  . MIGRAINE HEADACHE 11/27/2007  . ESSENTIAL HYPERTENSION 11/27/2007    Past Medical History  Diagnosis Date  . Migraine headache   . Back pain   . Obesity   .  Hypertension   . DVT (deep venous thrombosis) 2010  . Heart murmur     no cardiologist  . Asthma   . Bronchitis   . Shortness of breath     if having asthma attack  . Depression   . Diabetes mellitus without complication   . Obsessive-compulsive disorder   . PTSD (post-traumatic stress  disorder)     Past Surgical History  Procedure Laterality Date  . Appendectomy    . Carpal tunnel release    . Tubal ligation    . Lumbar spine surgery  2010    x2  . Back surgery    . Lysis of adhesion  2003    Dr. Elpidio Anis  . Trigger finger release  2009    right pinkie finger  . Anterior cervical decomp/discectomy fusion  07/07/2012    Procedure: ANTERIOR CERVICAL DECOMPRESSION/DISCECTOMY FUSION 2 LEVELS;  Surgeon: Karn Cassis, MD;  Location: [facility];  Service: Neurosurgery;  Laterality: N/A;  Cervical four-five, five - six  Anterior cervical decompression/diskectomy/fusion/plate  . Partial hysterectomy      History   Social History  . Marital Status: Married    Spouse Name: N/A    Number of Children: N/A  . Years of Education: N/A   Occupational History  . Not on file.   Social History Main Topics  . Smoking status: Never Smoker   . Smokeless tobacco: Never Used  . Alcohol Use: No  . Drug Use: No  . Sexual Activity: Not on file   Other Topics Concern  . Not on file   Social History Narrative  . No narrative on file    Family History  Problem Relation Age of Onset  . Lung cancer Father   . Stomach cancer Father   . Cancer Father   . Alcohol abuse Father   . Mental illness Father   . Diabetes Sister   . Hypertension Sister   . Bipolar disorder Sister   . Schizophrenia Sister   . Diabetes Sister   . Alcohol abuse Brother   . Hypertension Brother   . Kidney disease Brother   . Diabetes Brother   . Drug abuse Brother   . Mental illness Brother   . Alcohol abuse Brother   . Alcohol abuse Brother   . Hypertension Brother   . Diabetes Brother   . Alcohol abuse Brother   . Physical abuse Mother   . Alcohol abuse Mother   . Mental illness Brother   . ADD / ADHD Neg Hx   . Anxiety disorder Neg Hx   . Dementia Neg Hx   . Depression Neg Hx   . OCD Neg Hx   . Seizures Neg Hx   . Paranoid behavior Neg Hx   . Drug abuse Sister   . Alcohol  abuse Brother     Current Outpatient Prescriptions  Medication Sig Dispense Refill  . ALPRAZolam (XANAX) 0.5 MG tablet Take 1 tablet (0.5 mg total) by mouth 3 (three) times daily as needed for sleep or anxiety.  90 tablet  2  . amLODipine (NORVASC) 10 MG tablet Take 1 tablet (10 mg total) by mouth daily.  90 tablet  3  . Biotin (BIOTIN MAXIMUM STRENGTH) 5 MG CAPS Take 2 capsules by mouth daily.      Marland Kitchen bismuth-metronidazole-tetracycline (PYLERA) 140-125-125 MG per capsule Take 3 capsules by mouth 4 (four) times daily -  before meals and at bedtime.  120 capsule  0  .  budesonide-formoterol (SYMBICORT) 160-4.5 MCG/ACT inhaler Inhale 2 puffs into the lungs 2 (two) times daily.  1 Inhaler  12  . Carbamazepine (EQUETRO) 100 MG CP12 Take one cap each morning and two at bedtime  90 capsule  1  . Cetirizine HCl 10 MG TBDP Take 1 tablet by mouth daily.  30 tablet  5  . Cholecalciferol (VITAMIN D-1000 MAX ST) 1000 UNITS tablet Take 1,000 Units by mouth daily.      . cloNIDine (CATAPRES) 0.1 MG tablet 0.1 mg. Take 1 tablet (0.1 mg total) by mouth 3 (three) times daily as needed.      . cyclobenzaprine (FLEXERIL) 10 MG tablet One tablet at bedtime for neck spasm  30 tablet  3  . diclofenac sodium (VOLTAREN) 1 % GEL Apply 2gm QID to affected area      . fentaNYL (DURAGESIC) 12 MCG/HR 1 patch. Place 1 patch onto the skin every third day.      . fentaNYL (DURAGESIC) 12 MCG/HR 1 patch. Place 1 patch onto the skin every other day for 30 days.      . fluticasone (FLONASE) 50 MCG/ACT nasal spray Place 2 sprays into the nose daily.  16 g  2  . ipratropium (ATROVENT) 0.02 % nebulizer solution inhale contents of 1 vial in nebulizer every 6 hours if needed FOR WHEEZING  62.5 mL  3  . lidocaine (XYLOCAINE) 5 % ointment Apply 1 application topically as needed.      . lubiprostone (AMITIZA) 24 MCG capsule Take 1 capsule (24 mcg total) by mouth daily with breakfast. Take 24 mcg by mouth 2 times daily with meals.  30  capsule  6  . metFORMIN (GLUCOPHAGE XR) 500 MG 24 hr tablet Take 1 tablet (500 mg total) by mouth daily with breakfast.  30 tablet  3  . montelukast (SINGULAIR) 10 MG tablet Take 1 tablet (10 mg total) by mouth at bedtime.  30 tablet  2  . Multiple Vitamin (MULTIVITAMIN WITH MINERALS) TABS Take 1 tablet by mouth daily.      . naproxen (NAPROSYN) 375 MG tablet Take by mouth. Take 1 tablet (375 mg total) by mouth 2 (two) times daily with a meal.      . Olopatadine HCl (PATADAY) 0.2 % SOLN Apply to eye. Apply 1 drop to eye daily.      Marland Kitchen oxyCODONE (ROXICODONE) 15 MG immediate release tablet Take 15 mg by mouth every 4 (four) hours as needed. For breakthrough pain      . pantoprazole (PROTONIX) 40 MG tablet Take 1 tablet (40 mg total) by mouth daily.  30 tablet  6  . potassium chloride (K-DUR,KLOR-CON) 10 MEQ tablet Dose increase, two tablets three times daily  180 tablet  3  . predniSONE (DELTASONE) 5 MG tablet 5 mg. Take 5 mg by mouth daily.      . rizatriptan (MAXALT-MLT) 10 MG disintegrating tablet One tabet at headache onset , may repeat after 2 hours if headache persists. Maximum of 3 tablets in a 24 hour period.  Maximum use is twice weekly  10 tablet  2  . topiramate (TOPAMAX) 100 MG tablet Take 1 tablet (100 mg total) by mouth daily.  30 tablet  5  . triamterene-hydrochlorothiazide (MAXZIDE) 75-50 MG per tablet Take 1 tablet by mouth daily.  90 tablet  3   No current facility-administered medications for this visit.     Allergies  Allergen Reactions  . Neurontin [Gabapentin] Other (See Comments)    Other reaction(s):  Mental Status Changes (intolerance) hallucinations Sleep walking  . Amoxicillin Hives  . Ibuprofen     Thins blood and then requires injections to thicken blood  . Penicillins Hives  . Vicodin [Hydrocodone-Acetaminophen] Itching    Other reaction(s): Other (See Comments) Skin crawls     BP 124/84  Pulse 80  Temp(Src) 98.4 F (36.9 C) (Temporal)  Resp 14  Ht 5'  6" (1.676 m)  Wt 167 lb 6.4 oz (75.932 kg)  BMI 27.03 kg/m2  No results found.   Review of Systems  Constitutional: Negative for fever, chills, diaphoresis, appetite change and fatigue.  HENT: Negative for ear pain, sore throat, trouble swallowing, neck pain and ear discharge.   Eyes: Negative for photophobia, discharge and visual disturbance.  Respiratory: Negative for cough, choking, chest tightness and shortness of breath.   Cardiovascular: Negative for chest pain and palpitations.  Gastrointestinal: Positive for nausea, vomiting and abdominal pain. Negative for diarrhea, constipation, anal bleeding and rectal pain.  Endocrine: Negative for cold intolerance and heat intolerance.  Genitourinary: Negative for dysuria, frequency and difficulty urinating.  Musculoskeletal: Negative for myalgias and gait problem.  Skin: Negative for color change, pallor and rash.  Allergic/Immunologic: Negative for environmental allergies, food allergies and immunocompromised state.  Neurological: Positive for headaches. Negative for dizziness, speech difficulty, weakness and numbness.  Hematological: Negative for adenopathy.  Psychiatric/Behavioral: Negative for confusion and agitation. The patient is not nervous/anxious.        Objective:   Physical Exam  Constitutional: She is oriented to person, place, and time. She appears well-developed and well-nourished. No distress.  HENT:  Head: Normocephalic.  Mouth/Throat: Oropharynx is clear and moist. No oropharyngeal exudate.  Eyes: Conjunctivae and EOM are normal. Pupils are equal, round, and reactive to light. No scleral icterus.  Neck: Normal range of motion. Neck supple. No tracheal deviation present.  Cardiovascular: Normal rate, regular rhythm and intact distal pulses.   Pulmonary/Chest: Effort normal and breath sounds normal. No stridor. No respiratory distress. She exhibits no tenderness.  Abdominal: Soft. She exhibits no distension, no fluid  wave, no ascites and no mass. There is tenderness in the right lower quadrant. There is no rigidity, no guarding, no CVA tenderness, no tenderness at McBurney's point and negative Murphy's sign. No hernia. Hernia confirmed negative in the ventral area, confirmed negative in the right inguinal area and confirmed negative in the left inguinal area.    Genitourinary: No vaginal discharge found.  Musculoskeletal: Normal range of motion. She exhibits no tenderness.       Right elbow: She exhibits normal range of motion.       Left elbow: She exhibits normal range of motion.       Right wrist: She exhibits normal range of motion.       Left wrist: She exhibits normal range of motion.       Right hand: Normal strength noted.       Left hand: Normal strength noted.  Lymphadenopathy:       Head (right side): No posterior auricular adenopathy present.       Head (left side): No posterior auricular adenopathy present.    She has no cervical adenopathy.    She has no axillary adenopathy.       Right: No inguinal adenopathy present.       Left: No inguinal adenopathy present.  Neurological: She is alert and oriented to person, place, and time. No cranial nerve deficit. She exhibits normal muscle tone. Coordination normal.  Skin: Skin is warm and dry. No rash noted. She is not diaphoretic. No erythema.  Psychiatric: She has a normal mood and affect. Her speech is normal and behavior is normal. Judgment and thought content normal. Cognition and memory are normal.       Assessment:     Recurrent lower abdominal primarily right-sided pain and a history of prior lysis lesions/obstruction.  Rest of extensive gastroenterology workup negative aside from Asymptomatic H. Pylori duodenitis     Plan:     Her options are limited.  Shortly she is exhausted medical management.  Given the history very similar with relief from her prior lysis of adhesions, I think it is reasonable to do diagnostic laparoscopy and  see if I can find a transition point/area of adhesions of concern.  Probably related to her prior appendicitis / appendectomy.  Another possibility is endometriosis, but I think that is less likely given that she has not had major changes with menstruation and it would not explain nausea vomiting bloating.  Can I find any abnormalities in the pelvis, and I can biopsy the margin interoperative gynecological consult.  I doubt it will come to that.  She seems reasonable and Does not seem to have a surgical addiction where she is demanding surgery.  She is hopeful that I can provide relief, but understands it may not help or only partially help given her numerous prior surgeries and complex medical/psychiatric issues.  I spent some >59minutes discussing with her, reviewing films, reviewing studies.  The anatomy & physiology of the digestive tract was discussed.  The pathophysiology of intestinal obstruction was discussed.  Natural history risks without surgery was discussed.   Differential diagnosis was discussed  I feel the patient has failed non-operative therapies.  The risks of no intervention will lead to serious problems such as necrosis, perforation, dehydration, etc. that outweigh the operative risks; therefore, I recommended abdominal exploration to diagnose & treat the source of the problem.  Laparoscopic & open techniques were discussed.   I expressed a good likelihood that surgery will treat the problem.  Risks such as bleeding, infection, abscess, leak, reoperation, bowel resection, possible ostomy, hernia, heart attack, death, and other risks were discussed.   I noted a good likelihood this will help address the problem.  Goals of post-operative recovery were discussed as well.  We will work to minimize complications. Questions were answered.  The patient expresses understanding & wishes to proceed with surgery.   I encouraged her to continue to work to get feedback on a regimen to treat her H  pylori duodenitis from [facility] gastroenterology.  She tells me they are going to call in a new prescription this evening.

## 2013-07-29 NOTE — Transfer of Care (Signed)
Immediate Anesthesia Transfer of Care Note  Patient: Jane English  Procedure(s) Performed: Procedure(s): diagnostic laporoscopy (N/A) LYSIS OF ADHESION (N/A) serosal repair (N/A)  Patient Location: PACU  Anesthesia Type:General  Level of Consciousness: awake, alert , oriented and patient cooperative  Airway & Oxygen Therapy: Patient Spontanous Breathing and Patient connected to face mask oxygen  Post-op Assessment: Report given to PACU RN, Post -op Vital signs reviewed and stable and Patient moving all extremities  Post vital signs: Reviewed and stable  Complications: No apparent anesthesia complications

## 2013-07-29 NOTE — Anesthesia Preprocedure Evaluation (Addendum)
Anesthesia Evaluation  Patient identified by MRN, date of birth, ID band Patient awake    Reviewed: Allergy & Precautions, H&P , NPO status , [name] Chart, lab work & pertinent test results  Airway Mallampati: II TM Distance: >3 FB Neck ROM: full    Dental no notable dental hx. (+) Teeth Intact and Dental Advisory Given   Pulmonary shortness of breath and with exertion, asthma ,  Moderate to severe asthma breath sounds clear to auscultation  Pulmonary exam normal       Cardiovascular hypertension, Pt. on medications Rhythm:regular Rate:Normal     Neuro/Psych  Headaches, Anxiety Depression Psychotic episode with hallucinationsCervical fusion negative neurological ROS  negative psych ROS   GI/Hepatic negative GI ROS, Neg liver ROS, GERD-  Medicated and Controlled,  Endo/Other  diabetes, Well Controlled, Type 2, Oral Hypoglycemic Agents  Renal/GU negative Renal ROS  negative genitourinary   Musculoskeletal   Abdominal   Peds  Hematology negative hematology ROS (+)   Anesthesia Other Findings   Reproductive/Obstetrics negative OB ROS                        Anesthesia Physical Anesthesia Plan  ASA: III  Anesthesia Plan: General   Post-op Pain Management:    Induction: Intravenous  Airway Management Planned: Oral ETT  Additional Equipment:   Intra-op Plan:   Post-operative Plan: Extubation in OR  Informed Consent: I have reviewed the patients History and Physical, chart, labs and discussed the procedure including the risks, benefits and alternatives for the proposed anesthesia with the patient or authorized representative who has indicated his/her understanding and acceptance.   Dental Advisory Given  Plan Discussed with: CRNA and Surgeon  Anesthesia Plan Comments:         Anesthesia Quick Evaluation

## 2013-07-30 ENCOUNTER — Encounter (HOSPITAL_COMMUNITY): Payer: Self-pay | Admitting: Surgery

## 2013-07-30 LAB — GLUCOSE, CAPILLARY
Glucose-Capillary: 99 mg/dL (ref 70–99)
Glucose-Capillary: 116 mg/dL — ABNORMAL HIGH (ref 70–99)
Glucose-Capillary: 91 mg/dL (ref 70–99)

## 2013-07-30 LAB — BASIC METABOLIC PANEL
BUN: 9 mg/dL (ref 6–23)
CO2: 24 mEq/L (ref 19–32)
Calcium: 8.2 mg/dL — ABNORMAL LOW (ref 8.4–10.5)
Creatinine, Ser: 0.75 mg/dL (ref 0.50–1.10)
GFR calc Af Amer: >90 mL/min (ref >90)
GFR calc non Af Amer: >90 mL/min (ref >90)
Glucose, Bld: 93 mg/dL (ref 70–99)
Sodium: 138 mEq/L (ref 135–145)

## 2013-07-30 LAB — CBC
MCH: 30.9 pg (ref 26.0–34.0)
MCHC: 33.4 g/dL (ref 30.0–36.0)
MCV: 92.4 fL (ref 78.0–100.0)
Platelets: 248 10*3/uL (ref 150–400)
RBC: 3.4 MIL/uL — ABNORMAL LOW (ref 3.87–5.11)

## 2013-07-30 LAB — HEMOGLOBIN A1C: Hgb A1c MFr Bld: 5.6 % (ref <5.7)

## 2013-07-30 MED ORDER — INSULIN ASPART 100 UNIT/ML ~~LOC~~ SOLN: 0.0000 [IU] | Freq: Three times a day (TID) | SUBCUTANEOUS | Status: DC

## 2013-07-30 MED ORDER — NAPROXEN 500 MG PO TABS: 500.0000 mg | ORAL_TABLET | Freq: Two times a day (BID) | ORAL | Status: DC | PRN

## 2013-07-30 MED ORDER — INSULIN ASPART 100 UNIT/ML ~~LOC~~ SOLN: 0.0000 [IU] | Freq: Every day | SUBCUTANEOUS | Status: DC

## 2013-07-30 MED ORDER — ACETAMINOPHEN 650 MG RE SUPP: 650.0000 mg | Freq: Four times a day (QID) | RECTAL | Status: DC | PRN

## 2013-07-30 MED ORDER — ACETAMINOPHEN 325 MG PO TABS: 325.0000 mg | ORAL_TABLET | Freq: Four times a day (QID) | ORAL | Status: DC | PRN

## 2013-07-30 MED ORDER — HYDROMORPHONE HCL PF 1 MG/ML IJ SOLN
0.5000 mg | INTRAMUSCULAR | Status: DC | PRN
  Administered 2013-07-30 (×2): 1 mg via INTRAVENOUS
  Filled 2013-07-30 (×2): qty 1

## 2013-07-30 MED ORDER — OXYCODONE HCL 15 MG PO TABS: 15.0000 mg | ORAL_TABLET | ORAL | Status: DC | PRN

## 2013-07-30 MED ORDER — SODIUM CHLORIDE 0.9 % IJ SOLN: 3.0000 mL | Freq: Two times a day (BID) | INTRAMUSCULAR | Status: DC

## 2013-07-30 MED ORDER — OLOPATADINE HCL 0.1 % OP SOLN: 1.0000 [drp] | Freq: Two times a day (BID) | OPHTHALMIC | Status: DC | PRN

## 2013-07-30 MED ORDER — CARBAMAZEPINE ER 100 MG PO CP12
100.0000 mg | ORAL_CAPSULE | Freq: Every day | ORAL | Status: DC
  Administered 2013-07-30 – 2013-08-02 (×4): 100 mg via ORAL
  Filled 2013-07-30 (×5): qty 1

## 2013-07-30 MED ORDER — SODIUM CHLORIDE 0.9 % IJ SOLN: 3.0000 mL | INTRAMUSCULAR | Status: DC | PRN

## 2013-07-30 MED ORDER — CARBAMAZEPINE ER 200 MG PO CP12
200.0000 mg | ORAL_CAPSULE | Freq: Every day | ORAL | Status: DC
  Administered 2013-07-30 – 2013-08-02 (×4): 200 mg via ORAL
  Filled 2013-07-30 (×6): qty 1

## 2013-07-30 NOTE — Discharge Summary (Signed)
Physician Discharge Summary  Patient ID: Jane English MRN: [id number] DOB/AGE: [birth year] 44 y.o.  Admit date: 07/29/2013 Discharge date: 07/30/2013  Admission Diagnoses:  Discharge Diagnoses:  Active Problems:   Colicky RLQ abdominal pain, probable chronic PSBO  Discharged Condition: good  Hospital Course: Patient with chronic abdominal pain & negative GI / Gyn workup.  She underwent Dx laparoscopy with lysis of adhesions.  Postoperatively, the patient mobilized in the hallways and advanced to a solid diet gradually.  Enema hellped correct her constipation.  Pain was controlled and transitioned off IV medications.  Slow to mobilize but PT confirmed no problems/restrctions  By the time of discharge, the patient was walking 2-3 times in the hallways, eating food well, having flatus.  Constipation resolved.  Nausea less, bloating less.  Depression stable.  Pain controlled on an oral regimen.  Based on meeting DC criteria and recovering well, I felt it was safe for the patient to be discharged home with close followup.  Instructions were discussed in detail.  They are written as well.   Consults: None  Significant Diagnostic Studies:   Treatments:   POST-OPERATIVE DIAGNOSIS: recurrent abdominal pain,extensive adhesions  PROCEDURE: Procedure(s):  diagnostic laporoscopy  LYSIS OF ADHESIONS  serosal repair  SURGEON: Surgeon(s):  Ardeth Sportsman, MD  Kandis Cocking, MD - Asst   Discharge Exam: Blood pressure 122/73, pulse 62, temperature 98.1 F (36.7 C), temperature source Oral, resp. rate 16, height 5\' 6"  (1.676 m), weight 165 lb (74.844 kg), last menstrual period 07/26/2013, SpO2 98.00%.  General: Pt awake/alert/oriented x4 in no major acute distress Eyes: PERRL, normal EOM. Sclera nonicteric Neuro: CN II-XII intact w/o focal sensory/motor deficits. Lymph: No head/neck/groin lymphadenopathy Psych:  No delerium/psychosis/paranoia> Tired HENT: Normocephalic, Mucus membranes  moist.  No thrush Neck: Supple, No tracheal deviation Chest: No pain.  Good respiratory excursion. CV:  Pulses intact.  Regular rhythm MS: Normal AROM mjr joints.  No obvious deformity Abdomen: Soft, Nondistended.  Min tender at incisions.  No incarcerated hernias. Ext:  SCDs BLE.  No significant edema.  No cyanosis Skin: No petechiae / purpura   Disposition: 01-Home or Self Care  Discharge Orders   Future Appointments Provider Department Dept Phone   08/09/2013 9:15 AM Kerri Perches, MD [facility] [phone]   08/11/2013 10:45 AM Ardeth Sportsman, MD [facility] [place] [phone]   Future Orders Complete By Expires   Call MD for:  extreme fatigue  As directed    Call MD for:  extreme fatigue  As directed    Call MD for:  hives  As directed    Call MD for:  hives  As directed    Call MD for:  persistant nausea and vomiting  As directed    Call MD for:  persistant nausea and vomiting  As directed    Call MD for:  redness, tenderness, or signs of infection (pain, swelling, redness, odor or green/yellow discharge around incision site)  As directed    Call MD for:  redness, tenderness, or signs of infection (pain, swelling, redness, odor or green/yellow discharge around incision site)  As directed    Call MD for:  severe uncontrolled pain  As directed    Call MD for:  severe uncontrolled pain  As directed    Call MD for:  As directed    Comments:     Temperature > 101.10F   Call MD for:  As directed    Comments:  Temperature > 101.65F   Diet - low sodium heart healthy  As directed    Diet - low sodium heart healthy  As directed    Discharge instructions  As directed    Comments:     Please see discharge instruction sheets.  Also refer to handout given an office.  Please call our office if you have any questions or concerns [phone]   Discharge instructions  As directed    Comments:     Please see discharge instruction sheets.  Also refer to  handout given an office.  Please call our office if you have any questions or concerns [phone]   Discharge wound care:  As directed    Comments:     If you have closed incisions, shower and bathe over these incisions with soap and water every day.  Remove all surgical dressings on postoperative day #3.  You do not need to replace dressings over the closed incisions unless you feel more comfortable with a Band-Aid covering it.   Please call our office [phone] if you have further questions.   Discharge wound care:  As directed    Comments:     If you have closed incisions, shower and bathe over these incisions with soap and water every day.  Remove all surgical dressings on postoperative day #3.  You do not need to replace dressings over the closed incisions unless you feel more comfortable with a Band-Aid covering it.   If you have an open wound that requires packing, please see wound care instructions.  In general, remove all dressings, wash wound with soap and water and then replace with saline moistened gauze.  Do the dressing change at least every day.  Please call our office [phone] if you have further questions.   Driving Restrictions  As directed    Comments:     No driving until off narcotics and can safely swerve away without pain during an emergency   Driving Restrictions  As directed    Comments:     No driving until off narcotics and can safely swerve away without pain during an emergency   Increase activity slowly  As directed    Comments:     Walk an hour a day.  Use 20-30 minute walks.  When you can walk 30 minutes without difficulty, increase to low impact/moderate activities such as biking, jogging, swimming, sexual activity..  Eventually can increase to unrestricted activity when not feeling pain.  If you feel pain: STOP!Marland Kitchen   Let pain protect you from overdoing it.  Use ice/heat/over-the-counter pain medications to help minimize his soreness.  Use pain  prescriptions as needed to remain active.  It is better to take extra pain medications and be more active than to stay bedridden to avoid all pain medications.   Increase activity slowly  As directed    Comments:     Walk an hour a day.  Use 20-30 minute walks.  When you can walk 30 minutes without difficulty, increase to low impact/moderate activities such as biking, jogging, swimming, sexual activity..  Eventually can increase to unrestricted activity when not feeling pain.  If you feel pain: STOP!Marland Kitchen   Let pain protect you from overdoing it.  Use ice/heat/over-the-counter pain medications to help minimize his soreness.  Use pain prescriptions as needed to remain active.  It is better to take extra pain medications and be more active than to stay bedridden to avoid all pain medications.   Lifting restrictions  As directed    Comments:     Avoid heavy lifting initially.  Do not push through pain.  You have no specific weight limit.  Coughing and sneezing or four more stressful to your incision than any lifting you will do. Pain will protect you from injury.  Therefore, avoid intense activity until off all narcotic pain medications.  Coughing and sneezing or four more stressful to your incision than any lifting he will do.   Lifting restrictions  As directed    Comments:     Avoid heavy lifting initially.  Do not push through pain.  You have no specific weight limit.  Coughing and sneezing or four more stressful to your incision than any lifting you will do. Pain will protect you from injury.  Therefore, avoid intense activity until off all narcotic pain medications.  Coughing and sneezing or four more stressful to your incision than any lifting he will do.   May shower / Bathe  As directed    May shower / Bathe  As directed    May walk up steps  As directed    May walk up steps  As directed    Sexual Activity Restrictions  As directed    Comments:     Sexual activity as tolerated.  Do not push  through pain.  Pain will protect you from injury.   Sexual Activity Restrictions  As directed    Comments:     Sexual activity as tolerated.  Do not push through pain.  Pain will protect you from injury.   Walk with assistance  As directed    Comments:     Walk over an hour a day.  May use a walker/cane/companion to help with balance and stamina.   Walk with assistance  As directed    Comments:     Walk over an hour a day.  May use a walker/cane/companion to help with balance and stamina.       Medication List    STOP taking these medications       carbamazepine 100 MG 12 hr tablet  Commonly known as:  TEGRETOL XR      TAKE these medications       ALPRAZolam 0.5 MG tablet  Commonly known as:  XANAX  Take 0.5 mg by mouth 3 (three) times daily as needed for sleep or anxiety.     amLODipine 10 MG tablet  Commonly known as:  NORVASC  Take 10 mg by mouth every morning.     BIOTIN MAXIMUM STRENGTH 5 MG Caps  Generic drug:  Biotin  Take 10 mg by mouth daily.     bismuth-metronidazole-tetracycline 140-125-125 MG per capsule  Commonly known as:  PYLERA  Take 3 capsules by mouth 4 (four) times daily.     budesonide-formoterol 160-4.5 MCG/ACT inhaler  Commonly known as:  SYMBICORT  Inhale 2 puffs into the lungs 2 (two) times daily.     carbamazepine 200 MG Cp12 12 hr capsule  Commonly known as:  EQUETRO  - Take 100-200 mg by mouth 2 (two) times daily. Patient takes 100mg  in am and 200mg  in pm of Equetro for bipolar issues  - Uses 100mg  capsules     CATAPRES 0.1 MG tablet  Generic drug:  cloNIDine  Take 0.1 mg by mouth 3 (three) times daily as needed. Pain     cetirizine 10 MG tablet  Commonly known as:  ZYRTEC  Take 10 mg by mouth daily.  cyclobenzaprine 10 MG tablet  Commonly known as:  FLEXERIL  Take 10 mg by mouth 3 (three) times daily as needed for muscle spasms.     DURAGESIC 12 MCG/HR  Generic drug:  fentaNYL  Place 1 patch onto the skin every other day.      fluticasone 50 MCG/ACT nasal spray  Commonly known as:  FLONASE  Place 2 sprays into the nose daily.     ipratropium 0.02 % nebulizer solution  Commonly known as:  ATROVENT  Take 500 mcg by nebulization every 6 (six) hours as needed for wheezing.     lidocaine 5 % ointment  Commonly known as:  XYLOCAINE  Apply 1 application topically as needed (pain).     lubiprostone 24 MCG capsule  Commonly known as:  AMITIZA  Take 24 mcg by mouth 2 (two) times daily with a meal.     metFORMIN 500 MG 24 hr tablet  Commonly known as:  GLUCOPHAGE-XR  Take 500 mg by mouth daily with breakfast.     montelukast 10 MG tablet  Commonly known as:  SINGULAIR  Take 10 mg by mouth at bedtime.     multivitamin with minerals Tabs tablet  Take 1 tablet by mouth daily.     naproxen 500 MG tablet  Commonly known as:  NAPROSYN  Take 1 tablet (500 mg total) by mouth 2 (two) times daily as needed (for pain/soreness).     oxyCODONE 15 MG immediate release tablet  Commonly known as:  ROXICODONE  Take 1 tablet (15 mg total) by mouth every 4 (four) hours as needed.     oxyCODONE 15 MG immediate release tablet  Commonly known as:  ROXICODONE  Take 15 mg by mouth every 4 (four) hours as needed. For breakthrough pain     pantoprazole 40 MG tablet  Commonly known as:  PROTONIX  Take 40 mg by mouth daily.     PATADAY 0.2 % Soln  Generic drug:  Olopatadine HCl  Place 1 drop into both eyes daily.     potassium chloride 10 MEQ tablet  Commonly known as:  K-DUR,KLOR-CON  Take 20 mEq by mouth 2 (two) times daily.     predniSONE 5 MG tablet  Commonly known as:  DELTASONE  Take 5 mg by mouth daily.     rizatriptan 10 MG disintegrating tablet  Commonly known as:  MAXALT-MLT  Take 10 mg by mouth 2 (two) times daily as needed for migraine. May repeat in 2 hours if needed     topiramate 100 MG tablet  Commonly known as:  TOPAMAX  Take 100 mg by mouth at bedtime.     triamterene-hydrochlorothiazide  75-50 MG per tablet  Commonly known as:  MAXZIDE  Take 1 tablet by mouth at bedtime.     VITAMIN D-1000 MAX ST 1000 UNITS tablet  Generic drug:  Cholecalciferol  Take 1,000 Units by mouth daily.     VOLTAREN 1 % Gel  Generic drug:  diclofenac sodium  Apply 2 g topically 4 (four) times daily as needed. Pain       Follow-up Information   Follow up with Shamiah Kahler C., MD. Schedule an appointment as soon as possible for a visit in 2 weeks.   Specialty:  General Surgery   Contact information:   [address] [place] [address] [phone]       Signed: Ardeth Sportsman. 07/30/2013, 8:07 AM

## 2013-07-30 NOTE — Progress Notes (Addendum)
Jane English [id number] [birth year]  CARE TEAM:  PCP: Syliva Overman, MD  Outpatient Care Team: Patient Care Team: Kerri Perches, MD as PCP - General Ardeth Sportsman, MD as Consulting Physician (General Surgery) Karn Cassis, MD as Consulting Physician (Neurosurgery) Beverley Fiedler, MD as Consulting Physician (Gastroenterology)  Inpatient Treatment Team: Treatment Team: Attending Provider: Ardeth Sportsman, MD; Respiratory Therapist: Christie Beckers, RRT; Technician: Harrie Jeans Debbink, NT   Subjective:  Sore  Tol liquids   Objective:  Vital signs:  Filed Vitals:   07/29/13 2023 07/29/13 2210 07/30/13 0210 07/30/13 0535  BP:  126/80 129/83 122/73  Pulse:  73 78 62  Temp:  97.4 F (36.3 C) 98.1 F (36.7 C) 98.1 F (36.7 C)  TempSrc:  Oral Oral Oral  Resp:  18 18 16   Height:      Weight:      SpO2: 100% 100% 100% 98%    Last BM Date: 07/28/13  Intake/Output   Yesterday:  09/25 0701 - 09/26 0700 In: 5390 [P.O.:720; I.V.:4170; IV Piggyback:500] Out: 1670 [Urine:1650; Blood:20] This shift:     Bowel function:  Flatus: n  BM: n  Drain: n/a  Physical Exam:  General: Pt awake/alert/oriented x4 in no acute distress Eyes: PERRL, normal EOM.  Sclera clear.  No icterus Neuro: CN II-XII intact w/o focal sensory/motor deficits. Lymph: No head/neck/groin lymphadenopathy Psych:  No delerium/psychosis/paranoia.  Sleepy.  Looking at [employer] on phone HENT: Normocephalic, Mucus membranes moist.  No thrush Neck: Supple, No tracheal deviation Chest: No chest wall pain w good excursion CV:  Pulses intact.  Regular rhythm MS: Normal AROM mjr joints.  No obvious deformity Abdomen: Soft.  Nondistended.  Sensitive/tender at incisions only.  No evidence of peritonitis.  No incarcerated hernias. Ext:  SCDs BLE.  No mjr edema.  No cyanosis Skin: No petechiae / purpura   Problem List:   Principal Problem:   Colicky RLQ abdominal pain, probable chronic  PSBO Active Problems:   Chronic pain syndrome   Anxiety state, unspecified   DEPRESSION, CHRONIC   IGT (impaired glucose tolerance)   ESSENTIAL HYPERTENSION   Intrinsic asthma, unspecified   Chronic constipation   H. pylori duodenitis   Assessment  Jane English  42 y.o. female  1 Day Post-Op  Procedure(s): diagnostic laporoscopy LYSIS OF ADHESION serosal repair  Stable but challenging w h/o chronic pain  Plan:  -adv diet gradually -stop IVF & PRN only -IGT - SSI w QAM metformin - glc ok so far -HTN controlled - follow -chronic pain regimen -chronic bowel regimen -VTE prophylaxis- SCDs, etc -mobilize as tolerated to help recovery  D/C patient from hospital when patient meets criteria (anticipate in 0-2 day(s)):  Tolerating oral intake well Ambulating in walkways Adequate pain control without IV medications Urinating  Having flatus  Ardeth Sportsman, M.D., F.A.C.S. Gastrointestinal and Minimally Invasive Surgery [facility] [address], [address] [place], [place] [address] [phone] Main / Paging   07/30/2013   Results:   Labs: Results for orders placed during the hospital encounter of 07/29/13 (from the past 48 hour(s))  ABO/RH     Status: None   Collection Time    07/29/13  7:30 AM      Result Value Range   ABO/RH(D) AB POS    TYPE AND SCREEN     Status: None   Collection Time    07/29/13  7:50 AM      Result Value  Range   ABO/RH(D) AB POS     Antibody Screen NEG     Sample Expiration 08/01/2013    GLUCOSE, CAPILLARY     Status: None   Collection Time    07/29/13  7:54 AM      Result Value Range   Glucose-Capillary 96  70 - 99 mg/dL  GLUCOSE, CAPILLARY     Status: Abnormal   Collection Time    07/29/13  2:12 PM      Result Value Range   Glucose-Capillary 118 (*) 70 - 99 mg/dL  BASIC METABOLIC PANEL     Status: Abnormal   Collection Time    07/30/13  4:13 AM      Result Value Range   Sodium 138  135 - 145  mEq/L   Potassium 3.2 (*) 3.5 - 5.1 mEq/L   Chloride 104  96 - 112 mEq/L   CO2 24  19 - 32 mEq/L   Glucose, Bld 93  70 - 99 mg/dL   BUN 9  6 - 23 mg/dL   Creatinine, Ser 4.09  0.50 - 1.10 mg/dL   Calcium 8.2 (*) 8.4 - 10.5 mg/dL   GFR calc non Af Amer >90  >90 mL/min   GFR calc Af Amer >90  >90 mL/min   Comment: (NOTE)     The eGFR has been calculated using the CKD EPI equation.     This calculation has not been validated in all clinical situations.     eGFR's persistently <90 mL/min signify possible Chronic Kidney     Disease.  CBC     Status: Abnormal   Collection Time    07/30/13  4:13 AM      Result Value Range   WBC 7.7  4.0 - 10.5 K/uL   RBC 3.40 (*) 3.87 - 5.11 MIL/uL   Hemoglobin 10.5 (*) 12.0 - 15.0 g/dL   HCT 81.1 (*) 91.4 - 78.2 %   MCV 92.4  78.0 - 100.0 fL   MCH 30.9  26.0 - 34.0 pg   MCHC 33.4  30.0 - 36.0 g/dL   RDW 95.6  21.3 - 08.6 %   Platelets 248  150 - 400 K/uL    Imaging / Studies: No results found.  Medications / Allergies: per chart  Antibiotics: Anti-infectives   Start     Dose/Rate Route Frequency Ordered Stop   07/29/13 2200  tetracycline (ACHROMYCIN,SUMYCIN) capsule 500 mg     500 mg Oral 4 times daily 07/29/13 1651     07/29/13 2200  metroNIDAZOLE (FLAGYL) tablet 500 mg     500 mg Oral 4 times daily 07/29/13 1651     07/29/13 0745  clindamycin (CLEOCIN) IVPB 900 mg    Comments:  Pharmacy may adjust dosing strength, interval, or rate of medication as needed for optimal therapy for the patient Send with patient on call to the OR.  Anesthesia to complete antibiotic administration <85min prior to incision per [facility].   900 mg 100 mL/hr over 30 Minutes Intravenous On call to O.R. 07/29/13 0713 07/29/13 1011   07/29/13 0713  gentamicin (GARAMYCIN) 380 mg in dextrose 5 % 100 mL IVPB     5 mg/kg  75 kg 109.5 mL/hr over 60 Minutes Intravenous 30 min pre-op 07/29/13 0713 07/29/13 0936

## 2013-07-31 LAB — GLUCOSE, CAPILLARY
Glucose-Capillary: 130 mg/dL — ABNORMAL HIGH (ref 70–99)
Glucose-Capillary: 120 mg/dL — ABNORMAL HIGH (ref 70–99)

## 2013-07-31 MED ORDER — SODIUM CHLORIDE 0.9 % IV SOLN: 250.0000 mL | INTRAVENOUS | Status: DC | PRN

## 2013-07-31 MED ORDER — SODIUM CHLORIDE 0.9 % IV SOLN
INTRAVENOUS | Status: DC
  Administered 2013-08-01: 05:00:00 via INTRAVENOUS

## 2013-07-31 MED ORDER — CARBAMAZEPINE ER 100 MG PO CP12: 100.0000 mg | ORAL_CAPSULE | Freq: Every day | ORAL | Status: DC

## 2013-07-31 NOTE — Progress Notes (Signed)
Patient ID: Jane English, female   DOB: February 11, 1971, 42 y.o.   MRN: [id number]  General Surgery - [facility] - Progress Note  POD# 2  Subjective: Patient complains of pain.  Mild nausea, no emesis.  Denies flatus or BM.  Limited ambulation.  Objective: Vital signs in last 24 hours: Temp:  [97.8 F (36.6 C)-98.2 F (36.8 C)] 97.8 F (36.6 C) (09/27 0623) Pulse Rate:  [80-99] 81 (09/27 0623) Resp:  [18] 18 (09/27 0623) BP: (121-143)/(70-81) 132/77 mmHg (09/27 0623) SpO2:  [95 %-98 %] 98 % (09/27 0810) Last BM Date: 07/28/13  Intake/Output from previous day: 09/26 0701 - 09/27 0700 In: 825 [P.O.:180; I.V.:445; IV Piggyback:200] Out: 2600 [Urine:2600]  Exam: HEENT - clear, not icteric Neck - soft Chest - clear bilaterally Cor - RRR, no murmur Abd - moderately distended, rare BS; dressings dry and intact; diffusely tender with voluntary guarding Ext - no significant edema Neuro - grossly intact, no focal deficits  Lab Results:   Recent Labs  07/30/13 0413  WBC 7.7  HGB 10.5*  HCT 31.4*  PLT 248     Recent Labs  07/30/13 0413  NA 138  K 3.2*  CL 104  CO2 24  GLUCOSE 93  BUN 9  CREATININE 0.75  CALCIUM 8.2*    Studies/Results: No results found.  Assessment / Plan: 1.  Status laparoscopic lysis of adhesions for pain  Reduce to clear liquid diet for suspected ileus  Continue IVF  Ambulate in halls  Pain Rx  Rx nausea as needed  Velora Heckler, MD, [facility] Office: [phone]  07/31/2013

## 2013-08-01 LAB — GLUCOSE, CAPILLARY
Glucose-Capillary: 79 mg/dL (ref 70–99)
Glucose-Capillary: 117 mg/dL — ABNORMAL HIGH (ref 70–99)
Glucose-Capillary: 98 mg/dL (ref 70–99)

## 2013-08-01 NOTE — Progress Notes (Signed)
Patient ID: Jane English, female   DOB: April 29, 1971, 42 y.o.   MRN: [id number] [facility] Progress Note:   3 Days Post-Op  Subjective: Mental status is clear.  Sore Objective: Vital signs in last 24 hours: Temp:  [98.2 F (36.8 C)-98.4 F (36.9 C)] 98.4 F (36.9 C) (09/28 0500) Pulse Rate:  [77-90] 90 (09/28 0500) Resp:  [18] 18 (09/28 0500) BP: (117-139)/(76-86) 117/77 mmHg (09/28 0500) SpO2:  [98 %-99 %] 99 % (09/28 0930) FiO2 (%):  [98 %] 98 % (09/27 2025)  Intake/Output from previous day: 09/27 0701 - 09/28 0700 In: 1220 [P.O.:480; I.V.:740] Out: 2102 [Urine:2100; Stool:2] Intake/Output this shift:    Physical Exam: Work of breathing is normal.  Incisions are covered but bland.  Taking liquids ok and reluctant to go home until tolerating solids.  She says that despite her Metformin and DM she can eat anything.  Regular diet ordered.    Lab Results:  Results for orders placed during the hospital encounter of 07/29/13 (from the past 48 hour(s))  GLUCOSE, CAPILLARY     Status: Abnormal   Collection Time    07/30/13 12:23 PM      Result Value Range   Glucose-Capillary 103 (*) 70 - 99 mg/dL  GLUCOSE, CAPILLARY     Status: Abnormal   Collection Time    07/30/13  4:22 PM      Result Value Range   Glucose-Capillary 116 (*) 70 - 99 mg/dL  GLUCOSE, CAPILLARY     Status: None   Collection Time    07/30/13  9:33 PM      Result Value Range   Glucose-Capillary 91  70 - 99 mg/dL  GLUCOSE, CAPILLARY     Status: None   Collection Time    07/31/13  7:22 AM      Result Value Range   Glucose-Capillary 89  70 - 99 mg/dL  GLUCOSE, CAPILLARY     Status: None   Collection Time    07/31/13 12:22 PM      Result Value Range   Glucose-Capillary 99  70 - 99 mg/dL  GLUCOSE, CAPILLARY     Status: Abnormal   Collection Time    07/31/13  4:18 PM      Result Value Range   Glucose-Capillary 130 (*) 70 - 99 mg/dL  GLUCOSE, CAPILLARY     Status: Abnormal   Collection Time   07/31/13  9:30 PM      Result Value Range   Glucose-Capillary 120 (*) 70 - 99 mg/dL  GLUCOSE, CAPILLARY     Status: None   Collection Time    08/01/13  7:51 AM      Result Value Range   Glucose-Capillary 79  70 - 99 mg/dL    Radiology/Results: No results found.  Anti-infectives: Anti-infectives   Start     Dose/Rate Route Frequency Ordered Stop   07/29/13 2200  tetracycline (ACHROMYCIN,SUMYCIN) capsule 500 mg     500 mg Oral 4 times daily 07/29/13 1651     07/29/13 2200  metroNIDAZOLE (FLAGYL) tablet 500 mg     500 mg Oral 4 times daily 07/29/13 1651     07/29/13 0745  clindamycin (CLEOCIN) IVPB 900 mg    Comments:  Pharmacy may adjust dosing strength, interval, or rate of medication as needed for optimal therapy for the patient Send with patient on call to the OR.  Anesthesia to complete antibiotic administration <35min prior to incision per [facility]   900  mg 100 mL/hr over 30 Minutes Intravenous On call to O.R. 07/29/13 0713 07/29/13 1011   07/29/13 0713  gentamicin (GARAMYCIN) 380 mg in dextrose 5 % 100 mL IVPB     5 mg/kg  75 kg 109.5 mL/hr over 60 Minutes Intravenous 30 min pre-op 07/29/13 9147 07/29/13 0936      Assessment/Plan: Problem List: Patient Active Problem List   Diagnosis Date Noted  . H. pylori duodenitis 07/07/2013  . Colicky RLQ abdominal pain, probable chronic PSBO 07/07/2013  . PTSD (post-traumatic stress disorder) 03/26/2013  . Chronic constipation 03/23/2013  . CNS disorder 03/22/2013  . Chronic migraine without aura 03/18/2013  . Insomnia 03/18/2013  . Abnormal weight loss 02/28/2013  . Nausea alone 02/09/2013  . Seasonal allergies 12/10/2012  . GERD (gastroesophageal reflux disease) 10/29/2012  . Chronic pain syndrome 10/06/2012  . Postlaminectomy syndrome 10/06/2012  . Onychomycosis 09/02/2012  . Cervical neck pain with evidence of disc disease 04/13/2012  . Neck muscle spasm 12/31/2011  . Anxiety state, unspecified 08/30/2010  .  ALLERGIC RHINITIS CAUSE UNSPECIFIED 04/17/2010  . Intrinsic asthma, unspecified 03/04/2010  . DEPRESSION, CHRONIC 01/08/2009  . IGT (impaired glucose tolerance) 01/05/2009  . ESSENTIAL HYPERTENSION 11/27/2007    Sore but no acute issues noted.  Slowly progressing.  Advance to solids.  3 Days Post-Op    LOS: 3 days   Matt B. Daphine Deutscher, MD, [facility] [phone] beeper [phone]  08/01/2013 10:20 AM

## 2013-08-02 LAB — GLUCOSE, CAPILLARY
Glucose-Capillary: 107 mg/dL — ABNORMAL HIGH (ref 70–99)
Glucose-Capillary: 99 mg/dL (ref 70–99)
Glucose-Capillary: 128 mg/dL — ABNORMAL HIGH (ref 70–99)

## 2013-08-02 MED ORDER — LACTATED RINGERS IV BOLUS (SEPSIS): 1000.0000 mL | Freq: Three times a day (TID) | INTRAVENOUS | Status: DC | PRN

## 2013-08-02 MED ORDER — ACETAMINOPHEN 650 MG RE SUPP: 650.0000 mg | Freq: Four times a day (QID) | RECTAL | Status: DC | PRN

## 2013-08-02 MED ORDER — SODIUM CHLORIDE 0.9 % IJ SOLN: 3.0000 mL | INTRAMUSCULAR | Status: DC | PRN

## 2013-08-02 MED ORDER — ACETAMINOPHEN 325 MG PO TABS: 325.0000 mg | ORAL_TABLET | Freq: Four times a day (QID) | ORAL | Status: DC | PRN

## 2013-08-02 MED ORDER — SODIUM CHLORIDE 0.9 % IJ SOLN
3.0000 mL | Freq: Two times a day (BID) | INTRAMUSCULAR | Status: DC
  Administered 2013-08-02: 3 mL via INTRAVENOUS

## 2013-08-02 MED ORDER — OXYCODONE HCL 5 MG PO TABS
5.0000 mg | ORAL_TABLET | ORAL | Status: DC | PRN
  Administered 2013-08-02 – 2013-08-03 (×3): 15 mg via ORAL
  Filled 2013-08-02 (×2): qty 3
  Filled 2013-08-02: qty 2
  Filled 2013-08-02: qty 3

## 2013-08-02 NOTE — Progress Notes (Signed)
Jane English [id number] [birth year]  CARE TEAM:  PCP: Syliva Overman, MD  Outpatient Care Team: Patient Care Team: Kerri Perches, MD as PCP - General Ardeth Sportsman, MD as Consulting Physician (General Surgery) Karn Cassis, MD as Consulting Physician (Neurosurgery) Beverley Fiedler, MD as Consulting Physician (Gastroenterology)  Inpatient Treatment Team: Treatment Team: Attending Provider: Ardeth Sportsman, MD; Registered Nurse: Bethann Goo, RN; Technician: Lynden Ang, NT; Technician: Betsy Coder, NT   Subjective:  Sore  Tol some solids  Claims to walk in hallways  Worried about no BM - yet documented in I&O (?!?)  Thinks that she should've stopped her fentanyl patch but not sure    Objective:  Vital signs:  Filed Vitals:   08/01/13 1400 08/01/13 1942 08/01/13 2211 08/02/13 0543  BP: 118/68  128/76 106/70  Pulse: 85 91 92 84  Temp: 99.2 F (37.3 C)  98.2 F (36.8 C) 98 F (36.7 C)  TempSrc: Oral  Oral Oral  Resp: 18 18 18 16   Height:      Weight:      SpO2: 99% 98% 98% 97%    Last BM Date: 07/31/13  Intake/Output   Yesterday:  09/28 0701 - 09/29 0700 In: 1482.5 [P.O.:240; I.V.:1242.5] Out: 1350 [Urine:1350] This shift:     Bowel function:  Flatus: n  BM: No per pt  Drain: n/a  Physical Exam:  General: Pt awake/alert/oriented x4 in no acute distress Eyes: PERRL, normal EOM.  Sclera clear.  No icterus Neuro: CN II-XII intact w/o focal sensory/motor deficits. Lymph: No head/neck/groin lymphadenopathy Psych:  No delerium/psychosis/paranoia.  Quiet.  Seems depressed HENT: Normocephalic, Mucus membranes moist.  No thrush Neck: Supple, No tracheal deviation Chest: No chest wall pain w good excursion CV:  Pulses intact.  Regular rhythm MS: Normal AROM mjr joints.  No obvious deformity Abdomen: Soft.  Obese.  Mildly distended.  Sensitive/tender at incisions only.  No evidence of peritonitis.  No incarcerated hernias. Ext:   SCDs BLE.  No mjr edema.  No cyanosis Skin: No petechiae / purpura   Problem List:   Principal Problem:   Colicky RLQ abdominal pain, probable chronic PSBO Active Problems:   Chronic pain syndrome   Anxiety state, unspecified   DEPRESSION, CHRONIC   IGT (impaired glucose tolerance)   ESSENTIAL HYPERTENSION   Intrinsic asthma, unspecified   Chronic constipation   H. pylori duodenitis   Assessment  Jane English  42 y.o. female  4 Days Post-Op  Procedure(s): diagnostic laporoscopy LYSIS OF ADHESION serosal repair  Ileus resolving somewhat but challenging w h/o chronic pain  Plan:  -solid diet as tolerated -stop IVF & PRN only -IGT - SSI w QAM metformin - glc ok so far -HTN controlled - follow -chronic pain regimen -chronic bowel regimen.  Try an enema.   If not better, SMOG enema & check films -VTE prophylaxis- SCDs, etc -mobilize as tolerated to help recovery  D/C patient from hospital when patient meets criteria (anticipate in 1-2 day(s)):  Tolerating oral intake well Ambulating in walkways Adequate pain control without IV medications Urinating  Having flatus  Ardeth Sportsman, M.D., F.A.C.S. Gastrointestinal and Minimally Invasive Surgery [facility] [address], [address] [place], [place] [address] [phone] Main / Paging   08/02/2013   Results:   Labs: Results for orders placed during the hospital encounter of 07/29/13 (from the past 48 hour(s))  GLUCOSE, CAPILLARY     Status: None  Collection Time    07/31/13 12:22 PM      Result Value Range   Glucose-Capillary 99  70 - 99 mg/dL  GLUCOSE, CAPILLARY     Status: Abnormal   Collection Time    07/31/13  4:18 PM      Result Value Range   Glucose-Capillary 130 (*) 70 - 99 mg/dL  GLUCOSE, CAPILLARY     Status: Abnormal   Collection Time    07/31/13  9:30 PM      Result Value Range   Glucose-Capillary 120 (*) 70 - 99 mg/dL  GLUCOSE, CAPILLARY     Status: None    Collection Time    08/01/13  7:51 AM      Result Value Range   Glucose-Capillary 79  70 - 99 mg/dL  GLUCOSE, CAPILLARY     Status: Abnormal   Collection Time    08/01/13 12:15 PM      Result Value Range   Glucose-Capillary 117 (*) 70 - 99 mg/dL  GLUCOSE, CAPILLARY     Status: Abnormal   Collection Time    08/01/13  5:07 PM      Result Value Range   Glucose-Capillary 121 (*) 70 - 99 mg/dL  GLUCOSE, CAPILLARY     Status: None   Collection Time    08/01/13 10:06 PM      Result Value Range   Glucose-Capillary 98  70 - 99 mg/dL   Comment 1 Notify RN    GLUCOSE, CAPILLARY     Status: None   Collection Time    08/02/13  7:21 AM      Result Value Range   Glucose-Capillary 85  70 - 99 mg/dL    Imaging / Studies: No results found.  Medications / Allergies: per chart  Antibiotics: Anti-infectives   Start     Dose/Rate Route Frequency Ordered Stop   07/29/13 2200  tetracycline (ACHROMYCIN,SUMYCIN) capsule 500 mg     500 mg Oral 4 times daily 07/29/13 1651     07/29/13 2200  metroNIDAZOLE (FLAGYL) tablet 500 mg     500 mg Oral 4 times daily 07/29/13 1651     07/29/13 0745  clindamycin (CLEOCIN) IVPB 900 mg    Comments:  Pharmacy may adjust dosing strength, interval, or rate of medication as needed for optimal therapy for the patient Send with patient on call to the OR.  Anesthesia to complete antibiotic administration <59min prior to incision per [facility].   900 mg 100 mL/hr over 30 Minutes Intravenous On call to O.R. 07/29/13 0713 07/29/13 1011   07/29/13 0713  gentamicin (GARAMYCIN) 380 mg in dextrose 5 % 100 mL IVPB     5 mg/kg  75 kg 109.5 mL/hr over 60 Minutes Intravenous 30 min pre-op 07/29/13 0713 07/29/13 0936

## 2013-08-02 NOTE — Evaluation (Signed)
Physical Therapy Evaluation Patient Details Name: Jane English MRN: [id number] DOB: 10-21-1971 Today's Date: 08/02/2013 Time: 1415-1430 PT Time Calculation (min): 15 min  PT Assessment / Plan / Recommendation History of Present Illness     Clinical Impression  Pt s/p abdominal surgery presents with some soreness and slow to move. Performed and educated pt on steps, and pt able to perform, no other PT services needed at this time. Pt siad she is beginning to move a litle better just has a lot of gas pain.      PT Assessment  Patent does not need any further PT services    Follow Up Recommendations  No PT follow up    Does the patient have the potential to tolerate intense rehabilitation      Barriers to Discharge        Equipment Recommendations  None recommended by PT    Recommendations for Other Services     Frequency      Precautions / Restrictions     Pertinent Vitals/Pain Abdominal pain persist ant and present but tolerable.       Mobility  Bed Mobility Bed Mobility: Right Sidelying to Sit Right Sidelying to Sit: 6: Modified independent (Device/Increase time) Transfers Transfers: Sit to Stand;Stand to Sit Sit to Stand: 6: Modified independent (Device/Increase time) Stand to Sit: 6: Modified independent (Device/Increase time) Details for Transfer Assistance: guarded and slow, but able to perform Ambulation/Gait Ambulation/Gait Assistance: 7: Independent Ambulation Distance (Feet): 40 Feet (had just returned from walking in hallway with nursing.) Assistive device: None Ambulation/Gait Assistance Details: did well, good pace just sore in abdomen. Just returned from walkign in hallway priro to our session so we just walked to stpes and back.  Gait Pattern: Within Functional Limits Stairs: Yes Stairs Assistance: 6: Modified independent (Device/Increase time) Stair Management Technique: One rail Right Number of Stairs: 10 (able to perform, just got tired after )     Exercises     PT Diagnosis:    PT Problem List:   PT Treatment Interventions:       PT Goals(Current goals can be found in the care plan section) Acute Rehab PT Goals PT Goal Formulation: No goals set, d/c therapy (1 x eval)  Visit Information  Last PT Received On: 08/02/13 Assistance Needed: +1       Prior Functioning  Home Living Family/patient expects to be discharged to:: Private residence Living Arrangements: Spouse/significant other Available Help at Discharge: Family (husband and dtr) Type of Home: House Home Access: Stairs to enter Secretary/administrator of Steps: 20 Entrance Stairs-Rails: Right Home Layout: One level Home Equipment: None Prior Function Level of Independence: Independent Communication Communication: No difficulties    Cognition  Cognition Arousal/Alertness: Awake/alert Behavior During Therapy: WFL for tasks assessed/performed Overall Cognitive Status: Within Functional Limits for tasks assessed    Extremity/Trunk Assessment Lower Extremity Assessment Lower Extremity Assessment: Overall WFL for tasks assessed   Balance    End of Session PT - End of Session Activity Tolerance: Patient tolerated treatment well Patient left: in bed;with family/visitor present Nurse Communication: Mobility status  GP     Marella Bile 08/02/2013, 2:38 PM Marella Bile, PT Pager: [phone] 08/02/2013

## 2013-08-02 NOTE — Progress Notes (Signed)
Soap suds enema administered with great results. At least 1/3 of BSC filled. Pt tolerated well. Will continue to monitor

## 2013-08-03 ENCOUNTER — Other Ambulatory Visit: Payer: Self-pay | Admitting: Family Medicine

## 2013-08-03 ENCOUNTER — Ambulatory Visit (HOSPITAL_COMMUNITY): Payer: Self-pay | Admitting: Psychiatry

## 2013-08-08 ENCOUNTER — Emergency Department (HOSPITAL_COMMUNITY): Payer: Managed Care, Other (non HMO)

## 2013-08-08 ENCOUNTER — Inpatient Hospital Stay (HOSPITAL_COMMUNITY)
Admission: EM | Admit: 2013-08-08 | Discharge: 2013-09-01 | DRG: 330 | Disposition: A | Discharge status: Home / Self Care | Payer: Managed Care, Other (non HMO) | Attending: Surgery | Admitting: Surgery

## 2013-08-08 ENCOUNTER — Encounter (HOSPITAL_COMMUNITY): Payer: Self-pay | Admitting: Emergency Medicine

## 2013-08-08 DIAGNOSIS — E119 Type 2 diabetes mellitus without complications: Secondary | ICD-10-CM

## 2013-08-08 DIAGNOSIS — R109 Unspecified abdominal pain: Secondary | ICD-10-CM | POA: Diagnosis present

## 2013-08-08 DIAGNOSIS — K565 Intestinal adhesions [bands], unspecified as to partial versus complete obstruction: Secondary | ICD-10-CM

## 2013-08-08 DIAGNOSIS — F418 Other specified anxiety disorders: Secondary | ICD-10-CM | POA: Diagnosis present

## 2013-08-08 DIAGNOSIS — F411 Generalized anxiety disorder: Secondary | ICD-10-CM | POA: Diagnosis present

## 2013-08-08 DIAGNOSIS — Z79899 Other long term (current) drug therapy: Secondary | ICD-10-CM

## 2013-08-08 DIAGNOSIS — E669 Obesity, unspecified: Secondary | ICD-10-CM | POA: Diagnosis present

## 2013-08-08 DIAGNOSIS — K219 Gastro-esophageal reflux disease without esophagitis: Secondary | ICD-10-CM | POA: Diagnosis present

## 2013-08-08 DIAGNOSIS — K5909 Other constipation: Secondary | ICD-10-CM

## 2013-08-08 DIAGNOSIS — K929 Disease of digestive system, unspecified: Secondary | ICD-10-CM | POA: Diagnosis not present

## 2013-08-08 DIAGNOSIS — G43719 Chronic migraine without aura, intractable, without status migrainosus: Secondary | ICD-10-CM | POA: Diagnosis present

## 2013-08-08 DIAGNOSIS — Z9889 Other specified postprocedural states: Secondary | ICD-10-CM

## 2013-08-08 DIAGNOSIS — Z5331 Laparoscopic surgical procedure converted to open procedure: Secondary | ICD-10-CM

## 2013-08-08 DIAGNOSIS — F319 Bipolar disorder, unspecified: Secondary | ICD-10-CM | POA: Diagnosis present

## 2013-08-08 DIAGNOSIS — F4310 Post-traumatic stress disorder, unspecified: Secondary | ICD-10-CM | POA: Diagnosis present

## 2013-08-08 DIAGNOSIS — E46 Unspecified protein-calorie malnutrition: Secondary | ICD-10-CM | POA: Diagnosis present

## 2013-08-08 DIAGNOSIS — Z86718 Personal history of other venous thrombosis and embolism: Secondary | ICD-10-CM

## 2013-08-08 DIAGNOSIS — G894 Chronic pain syndrome: Secondary | ICD-10-CM | POA: Diagnosis present

## 2013-08-08 DIAGNOSIS — E1169 Type 2 diabetes mellitus with other specified complication: Secondary | ICD-10-CM | POA: Diagnosis present

## 2013-08-08 DIAGNOSIS — K56609 Unspecified intestinal obstruction, unspecified as to partial versus complete obstruction: Secondary | ICD-10-CM | POA: Diagnosis present

## 2013-08-08 DIAGNOSIS — G43709 Chronic migraine without aura, not intractable, without status migrainosus: Secondary | ICD-10-CM | POA: Diagnosis present

## 2013-08-08 DIAGNOSIS — Z515 Encounter for palliative care: Secondary | ICD-10-CM

## 2013-08-08 DIAGNOSIS — I10 Essential (primary) hypertension: Secondary | ICD-10-CM | POA: Diagnosis present

## 2013-08-08 DIAGNOSIS — K560 Paralytic ileus: Secondary | ICD-10-CM | POA: Diagnosis not present

## 2013-08-08 HISTORY — DX: Other constipation: K59.09

## 2013-08-08 HISTORY — DX: Other chronic pain: G89.29

## 2013-08-08 HISTORY — DX: Unspecified abdominal pain: R10.9

## 2013-08-08 LAB — COMPREHENSIVE METABOLIC PANEL
ALT: 10 U/L (ref 0–35)
Albumin: 4.2 g/dL (ref 3.5–5.2)
Alkaline Phosphatase: 63 U/L (ref 39–117)
CO2: 26 mEq/L (ref 19–32)
Creatinine, Ser: 1.03 mg/dL (ref 0.50–1.10)
Potassium: 3.7 mEq/L (ref 3.5–5.1)
Sodium: 140 mEq/L (ref 135–145)
Total Bilirubin: 0.3 mg/dL (ref 0.3–1.2)
Total Protein: 7.9 g/dL (ref 6.0–8.3)

## 2013-08-08 LAB — CBC WITH DIFFERENTIAL/PLATELET
Basophils Absolute: 0 10*3/uL (ref 0.0–0.1)
Basophils Relative: 1 % (ref 0–1)
Eosinophils Absolute: 0.2 10*3/uL (ref 0.0–0.7)
Eosinophils Relative: 2 % (ref 0–5)
MCH: 31.2 pg (ref 26.0–34.0)
MCHC: 33.9 g/dL (ref 30.0–36.0)
MCV: 91.9 fL (ref 78.0–100.0)
Monocytes Absolute: 0.8 10*3/uL (ref 0.1–1.0)
Neutrophils Relative %: 51 % (ref 43–77)
Platelets: 367 10*3/uL (ref 150–400)
RBC: 4.3 MIL/uL (ref 3.87–5.11)

## 2013-08-08 LAB — GLUCOSE, CAPILLARY: Glucose-Capillary: 108 mg/dL — ABNORMAL HIGH (ref 70–99)

## 2013-08-08 LAB — D-DIMER, QUANTITATIVE: D-Dimer, Quant: 2.73 ug/mL-FEU — ABNORMAL HIGH (ref 0.00–0.48)

## 2013-08-08 LAB — URINALYSIS, ROUTINE W REFLEX MICROSCOPIC
Glucose, UA: NEGATIVE mg/dL
Hgb urine dipstick: NEGATIVE
Ketones, ur: 40 mg/dL — AB
Nitrite: NEGATIVE
Specific Gravity, Urine: 1.035 — ABNORMAL HIGH (ref 1.005–1.030)
pH: 5.5 (ref 5.0–8.0)

## 2013-08-08 LAB — POCT I-STAT TROPONIN I: Troponin i, poc: 0 ng/mL (ref 0.00–0.08)

## 2013-08-08 MED ORDER — CARBAMAZEPINE ER 100 MG PO CP12
100.0000 mg | ORAL_CAPSULE | Freq: Every day | ORAL | Status: DC
  Filled 2013-08-08: qty 1

## 2013-08-08 MED ORDER — IOHEXOL 350 MG/ML SOLN
100.0000 mL | Freq: Once | INTRAVENOUS | Status: AC | PRN
  Administered 2013-08-08: 100 mL via INTRAVENOUS

## 2013-08-08 MED ORDER — BUDESONIDE-FORMOTEROL FUMARATE 160-4.5 MCG/ACT IN AERO
2.0000 | INHALATION_SPRAY | Freq: Two times a day (BID) | RESPIRATORY_TRACT | Status: DC
  Administered 2013-08-08 – 2013-08-30 (×37): 2 via RESPIRATORY_TRACT
  Filled 2013-08-08 (×2): qty 6

## 2013-08-08 MED ORDER — ONDANSETRON HCL 4 MG/2ML IJ SOLN
4.0000 mg | Freq: Once | INTRAMUSCULAR | Status: AC
  Administered 2013-08-08: 4 mg via INTRAVENOUS
  Filled 2013-08-08: qty 2

## 2013-08-08 MED ORDER — PANTOPRAZOLE SODIUM 40 MG IV SOLR
40.0000 mg | Freq: Every day | INTRAVENOUS | Status: DC
  Administered 2013-08-08 – 2013-08-12 (×4): 40 mg via INTRAVENOUS
  Filled 2013-08-08 (×7): qty 40

## 2013-08-08 MED ORDER — INFLUENZA VAC SPLIT QUAD 0.5 ML IM SUSP
0.5000 mL | INTRAMUSCULAR | Status: AC
  Filled 2013-08-08 (×2): qty 0.5

## 2013-08-08 MED ORDER — HYDROMORPHONE HCL PF 1 MG/ML IJ SOLN
1.0000 mg | Freq: Once | INTRAMUSCULAR | Status: AC
  Administered 2013-08-08: 1 mg via INTRAVENOUS
  Filled 2013-08-08: qty 1

## 2013-08-08 MED ORDER — IPRATROPIUM BROMIDE 0.02 % IN SOLN: 500.0000 ug | Freq: Four times a day (QID) | RESPIRATORY_TRACT | Status: DC | PRN

## 2013-08-08 MED ORDER — MORPHINE SULFATE 2 MG/ML IJ SOLN
1.0000 mg | INTRAMUSCULAR | Status: DC | PRN
  Administered 2013-08-08 – 2013-08-09 (×2): 2 mg via INTRAVENOUS
  Filled 2013-08-08 (×2): qty 1

## 2013-08-08 MED ORDER — FLUTICASONE PROPIONATE 50 MCG/ACT NA SUSP
2.0000 | Freq: Every day | NASAL | Status: DC
  Administered 2013-08-14 – 2013-09-01 (×17): 2 via NASAL
  Filled 2013-08-08: qty 16

## 2013-08-08 MED ORDER — SODIUM CHLORIDE 0.9 % IV BOLUS (SEPSIS)
1000.0000 mL | Freq: Once | INTRAVENOUS | Status: AC
  Administered 2013-08-08: 1000 mL via INTRAVENOUS

## 2013-08-08 MED ORDER — POTASSIUM CHLORIDE IN NACL 20-0.45 MEQ/L-% IV SOLN
INTRAVENOUS | Status: DC
  Administered 2013-08-08 – 2013-08-09 (×3): via INTRAVENOUS
  Filled 2013-08-08 (×4): qty 1000

## 2013-08-08 MED ORDER — HEPARIN SODIUM (PORCINE) 5000 UNIT/ML IJ SOLN
5000.0000 [IU] | Freq: Three times a day (TID) | INTRAMUSCULAR | Status: DC
  Administered 2013-08-08 – 2013-09-01 (×69): 5000 [IU] via SUBCUTANEOUS
  Filled 2013-08-08 (×74): qty 1

## 2013-08-08 MED ORDER — CARBAMAZEPINE ER 200 MG PO CP12
200.0000 mg | ORAL_CAPSULE | Freq: Every day | ORAL | Status: DC
  Administered 2013-08-08: 200 mg via ORAL
  Filled 2013-08-08 (×2): qty 1

## 2013-08-08 MED ORDER — PNEUMOCOCCAL VAC POLYVALENT 25 MCG/0.5ML IJ INJ
0.5000 mL | INJECTION | INTRAMUSCULAR | Status: AC
  Filled 2013-08-08 (×2): qty 0.5

## 2013-08-08 MED ORDER — LIDOCAINE 5 % EX OINT
1.0000 "application " | TOPICAL_OINTMENT | CUTANEOUS | Status: DC | PRN
  Filled 2013-08-08: qty 35.44

## 2013-08-08 MED ORDER — ONDANSETRON HCL 4 MG/2ML IJ SOLN: 4.0000 mg | Freq: Four times a day (QID) | INTRAMUSCULAR | Status: DC | PRN

## 2013-08-08 NOTE — H&P (Signed)
Re:   Jane English DOB:   1971-04-09 MRN:   [id number]  [facility] Surg Admission  ASSESSMENT AND PLAN: 1.  Small bowel obstruction  S/P enterolysis of adhesions by Dr. Michaell Cowing - 07/29/2013  Very difficult patient with multiple overlaying diagnoses.  She is on a of meds which probably play a role in her chronic symptoms.  The CT scan appears to show dilated SB.  I don't think that she needs a NGT yet.  I will give a limited number of her meds orally.  Use IV morphine to control her pain for now, though she knows that this is not a good long term solution.  Her husband, who is with her, recognizes that she needs to be weaned from many of the meds she is taking.  Plan to repeat KUB in AM.  2.  Chronic pain syndrome - Dr. Myna Hidalgo and Dr. Brooke Dare see her at the [facility] pain clinic.  They placed the pain stimulator in Dec 2013.  Was on Duragesic patch until about 3 weeks ago. 3.  History of Anxiety/depression  Seen by Dr. Sheela Stack of [facility].  Though she did not remember seeing him. Sees Florencia Reasons for therapy. 4.  Hypertension 5.  History of asthma 6.  PTSD 7.  History of DVT  Associated with back surgery in 2010. 8.  Spinal cord stimulator.  Placed by [facility] pain clinic. 9.  History of neck muscle spasm. 10.  Diabetes mellitus.  On oral hypoglycemics. 11. History of migraine headache 12.  Mood swings   History of bipolar disorder.  Chief Complaint  Patient presents with  . Abdominal Pain  . Constipation  . Post-op Problem   REFERRING PHYSICIAN:  Raymon Mutton, PA - [facility] ER  HISTORY OF PRESENT ILLNESS: Jane English is a 42 y.o. (DOB: [birth year])  AA  female whose primary care physician is Syliva Overman, MD and comes to [facility] ER for abdominal pain and bloating. She is accompanied with her husband, Jane English.  She had a S/P enterolysis of adhesions by Dr. Michaell Cowing - 07/29/2013.  States that she has not "been right since surgery". C/o gen abd pain and bloating. States that  she has not had a BM since Tuesday, 08/03/2013. C/o NV.  Part of the reason for her surgery was chronic RLQ abdominal pain.  Her prior abdominal surgery includes:  enterolysis of adhesions by Dr. Maggie Schwalbe 04/02/2002, appendectomy - 1986, and partial hysterectomy - 1990's. She saw Dr. Chestine Spore from a GI standpoint before surgery.  She was H. Pylori positive, has GERD on PPI, and she has had chronic trouble with constipation. Prior to her surgery by Dr. Michaell Cowing, she has had significant abdominal and back pain issues.  CT scan - 08/08/2013 - Recurrent small bowel obstruction.  Transition point in the central abdomen involving either the distal jejunum or proximal ileum. This may be early high grade or partial based on stool within the colon.  The patient has a 116 "Notes" in Epic since 11/04/2012!   Past Medical History  Diagnosis Date  . Migraine headache   . Back pain   . Obesity   . Hypertension   . DVT (deep venous thrombosis) 2010  . Heart murmur     no cardiologist  . Asthma   . Bronchitis   . Depression   . Diabetes mellitus without complication   . Obsessive-compulsive disorder   . PTSD (post-traumatic stress disorder)   . Shortness  of breath   . GERD (gastroesophageal reflux disease)   . Anemia   . 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 Asthma flare 04/09/2013  . Seasonal allergies 12/10/2012  . Chronic abdominal pain   . Chronic constipation       Past Surgical History  Procedure Laterality Date  . Appendectomy  1986  . Carpal tunnel release    . Tubal ligation  1994  . Lumbar spine surgery  2010    x2  . Back surgery    . Lysis of adhesion  2003    Dr. Elpidio Anis  . Trigger finger release  2009    right pinkie finger  . Anterior cervical decomp/discectomy fusion  07/07/2012    Procedure: ANTERIOR CERVICAL DECOMPRESSION/DISCECTOMY FUSION 2 LEVELS;  Surgeon: Karn Cassis, MD;  Location: [facility];  Service: Neurosurgery;  Laterality: N/A;  Cervical four-five, five - six  Anterior cervical decompression/diskectomy/fusion/plate  . Partial hysterectomy  1990s?    [facility] [place]  . Spinal cord stimulator implant    . Oophorectomy    . Laparoscopy N/A 07/29/2013    Procedure: diagnostic laporoscopy;  Surgeon: Ardeth Sportsman, MD;  Location: [facility] ORS;  Service: General;  Laterality: N/A;  . Lysis of adhesion N/A 07/29/2013    Procedure: LYSIS OF ADHESION;  Surgeon: Ardeth Sportsman, MD;  Location: [facility]S;  Service: General;  Laterality: N/A;  . Bowel resection N/A 07/29/2013    Procedure: serosal repair;  Surgeon: Ardeth Sportsman, MD;  Location: [facility]S;  Service: General;  Laterality: N/A;      Current Facility-Administered Medications  Medication Dose Route Frequency Provider Last Rate Last Dose  . HYDROmorphone (DILAUDID) injection 1 mg  1 mg Intravenous Once Marissa Sciacca, PA-C      . ondansetron (ZOFRAN) injection 4 mg  4 mg Intravenous Once Marissa Sciacca, PA-C      . sodium chloride 0.9 % bolus 1,000 mL  1,000 mL Intravenous Once Raymon Mutton, PA-C       Current Outpatient Prescriptions  Medication Sig Dispense Refill  . ALPRAZolam (XANAX) 0.5 MG tablet Take 0.5 mg by mouth 3 (three) times daily as needed for sleep or anxiety.      Marland Kitchen amLODipine (NORVASC) 10 MG tablet Take 10 mg by mouth every morning.      . Biotin (BIOTIN MAXIMUM STRENGTH) 5 MG CAPS Take 10 mg by mouth daily.       Marland Kitchen bismuth-metronidazole-tetracycline (PYLERA) 140-125-125 MG per capsule Take 3 capsules by mouth 4 (four) times daily.      . budesonide-formoterol (SYMBICORT) 160-4.5 MCG/ACT inhaler Inhale 2 puffs into the lungs 2 (two) times daily.      . carbamazepine (EQUETRO) 200 MG CP12 12 hr capsule Take 100-200 mg by mouth 2 (two) times daily. Patient takes 100mg  in am and 200mg  in pm of Equetro for bipolar issues Uses 100mg  capsules      . cetirizine (ZYRTEC) 10 MG tablet Take 10 mg by mouth daily.        . Cholecalciferol (VITAMIN D-1000 MAX ST) 1000 UNITS tablet Take 1,000 Units by mouth daily.       . diclofenac sodium (VOLTAREN) 1 % GEL Apply 2 g topically 4 (four) times daily as needed. Pain      . fluticasone (FLONASE) 50 MCG/ACT nasal spray Place 2 sprays into the nose daily.      Marland Kitchen  lubiprostone (AMITIZA) 24 MCG capsule Take 24 mcg by mouth 2 (two) times daily with a meal.      . metFORMIN (GLUCOPHAGE-XR) 500 MG 24 hr tablet Take 500 mg by mouth daily with breakfast.      . montelukast (SINGULAIR) 10 MG tablet TAKE 1 TABLET (10 MG TOTAL) BY MOUTH AT BEDTIME.  30 tablet  0  . Multiple Vitamin (MULTIVITAMIN WITH MINERALS) TABS Take 1 tablet by mouth daily.      Marland Kitchen oxyCODONE (ROXICODONE) 15 MG immediate release tablet Take 15 mg by mouth every 4 (four) hours as needed. For breakthrough pain      . pantoprazole (PROTONIX) 40 MG tablet Take 40 mg by mouth daily.      . potassium chloride (K-DUR,KLOR-CON) 10 MEQ tablet Take 20 mEq by mouth 2 (two) times daily.      Marland Kitchen tiZANidine (ZANAFLEX) 4 MG tablet Take 4 mg by mouth every 6 (six) hours as needed (muscle spasms for 30 days).      . topiramate (TOPAMAX) 100 MG tablet Take 100 mg by mouth at bedtime.      . triamterene-hydrochlorothiazide (MAXZIDE) 75-50 MG per tablet Take 1 tablet by mouth at bedtime.      . cyclobenzaprine (FLEXERIL) 10 MG tablet Take 10 mg by mouth 3 (three) times daily as needed for muscle spasms.      . fentaNYL (DURAGESIC) 12 MCG/HR Place 1 patch onto the skin every other day.       . ipratropium (ATROVENT) 0.02 % nebulizer solution Take 500 mcg by nebulization every 6 (six) hours as needed for wheezing.      . lidocaine (XYLOCAINE) 5 % ointment Apply 1 application topically as needed (pain).       . naproxen (NAPROSYN) 500 MG tablet Take 1 tablet (500 mg total) by mouth 2 (two) times daily as needed (for pain/soreness).  40 tablet  1  . Olopatadine HCl (PATADAY) 0.2 % SOLN Place 1 drop into both eyes daily.       .  rizatriptan (MAXALT-MLT) 10 MG disintegrating tablet Take 10 mg by mouth 2 (two) times daily as needed for migraine. May repeat in 2 hours if needed          Allergies  Allergen Reactions  . Neurontin [Gabapentin] Other (See Comments)    Other reaction(s): Mental Status Changes (intolerance) hallucinations Sleep walking  . Amoxicillin Hives  . Ibuprofen     Thins blood and then requires injections to thicken blood.  Tolerates naproxen w/o problems (?)  . Penicillins Hives  . Latex Rash    REVIEW OF SYSTEMS: Skin:  No history of rash.  No history of abnormal moles. Infection:  No history of hepatitis or HIV.  No history of MRSA. Neurologic:  Spinal cord stimulator from [facility].  Dr. Jeral Fruit has done her neck and back surgery.  L5-S1 diskectomy by Dr. Jeral Fruit - 09/13/2009.  Anterior decompression and diskectomy by Dr. Jeral Fruit 07/07/2012. Cardiac:  Hypertension. No history of heart disease.   Pulmonary:  Asthma/bronchitis.  Endocrine:  Diabetes mellitus since 2011.  She refuses insulin. No thyroid disease. Gastrointestinal:  See HPI.  History of prior enterolysis of adhesions by Dr. Maggie Schwalbe 04/02/2002.  History of appendectomy - 1986, and partial hysterectomy - 1990's. Urologic:  No history of kidney stones.  No history of bladder infections. Musculoskeletal:  Anterior cervical disk 07/2012.  History of lumbar spine surgery x 2 2010. Hematologic:  History of DVT in 2010 with her  back surgery. Psycho-social:  History of bipolar disease.  Chronic pain syndrome.  PTSD.  SOCIAL and FAMILY HISTORY: Married - June 2014 Husband Jane English, at bedside. She has 4 children ages 44, 66, 74, and 59.  None live with her. She has been on disability since 2011.  PHYSICAL EXAM: BP 114/72  Pulse 85  Temp(Src) 98.4 F (36.9 C) (Oral)  Resp 20  SpO2 98%  LMP 07/30/2013  General: AA who is alert.  HEENT: Normal. Pupils equal. Neck: Supple. No mass.  No thyroid mass. She has a left neck scar from  a cervical laminectomy. Lymph Nodes:  No supraclavicular or cervical nodes. Lungs: Clear to auscultation and symmetric breath sounds. Heart:  RRR. No murmur or rub. Abdomen: Soft. No mass. No hernia. She has a long midline scar (old).  Laparoscopic incisions which look okay.  She is tender, but has no peritoneal sxes.  She has decreased BS. Rectal: Not done. Extremities:  Good strength and ROM  in upper and lower extremities. Neurologic:  Grossly intact to motor and sensory function. Psychiatric: Angry and a little difficult to talk to.  DATA REVIEWED: Epic notes.  Ovidio Kin, MD,  [facility] [address]  [address]   [place], [place]    [address] Phone:  [phone] FAX:  [phone]

## 2013-08-08 NOTE — ED Notes (Signed)
Bedside report given to Autumn Dennis, RN 

## 2013-08-08 NOTE — ED Notes (Signed)
PA at bedside.

## 2013-08-08 NOTE — ED Provider Notes (Signed)
CSN: [id number]     Arrival date & time 08/08/13  1543 History   First MD Initiated Contact with Patient 08/08/13 1553     Chief Complaint  Patient presents with  . Abdominal Pain  . Constipation  . Post-op Problem   (Consider location/radiation/quality/duration/timing/severity/associated sxs/prior Treatment) The history is provided by the patient and the spouse. No language interpreter was used.  Jane English is a 42 year old female with past medical history of migraine, back pain, obesity, hypertension, DVT, asthma, diabetes, anemia, GERD, PTSD presenting to emergency department with abdominal pain and constipation that has been ongoing for the past couple of days. Patient chart was reviewed, patient recently had laparoscopic surgery performed on 07/29/2013 by Dr. Karie Soda, patient had laparoscopic surgery for lysis of adhesions. Patient was admitted to hospital on 07/29/2013 and was discharged on 07/30/2013. Patient reports she's been experiencing abdominal pain ever since the surgery. Patient reports she has noticed a bloating sensation to her abdomen, reports it feels hard. Patient states that she has been experiencing a constant pressure sensation, reports that the pain is as if "someone is trying to have a baby." Patient reports that eating makes the pain worse while nothing makes the pain better. Patient reports that the pain radiates up to her throat. Patient reported that she has been experiencing intermittent chest pain associated with shortness of breath - stated that the chest pain is localized to the center and bilateral sides of the chest , described as a pressure sensation. Patient reported that she has been feeling nauseous and has been vomiting intermittently throughout the day since she was discharged from the hospital. Patient stated that she is not able to keep any liquids down, has not tried foods. Stated that her urination has decreased. Stated that she has not had a bowel  movement since Tuesday of this past week. Reported that her next appointment with Dr. Michaell Cowing is on 08/11/2013. Denied dysuria, hematuria, numbness, tingling, loss of sensation, fever.  PCP Dr. Judie Petit. Lodema Hong   Past Medical History  Diagnosis Date  . Migraine headache   . Back pain   . Obesity   . Hypertension   . DVT (deep venous thrombosis) 2010  . Heart murmur     no cardiologist  . Asthma   . Bronchitis   . Depression   . Diabetes mellitus without complication   . Obsessive-compulsive disorder   . PTSD (post-traumatic stress disorder)   . Shortness of breath   . GERD (gastroesophageal reflux disease)   . Anemia   . 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 Asthma flare 04/09/2013  . Seasonal allergies 12/10/2012  . Chronic abdominal pain   . Chronic constipation    Past Surgical History  Procedure Laterality Date  . Appendectomy  1986  . Carpal tunnel release    . Tubal ligation  1994  . Lumbar spine surgery  2010    x2  . Back surgery    . Lysis of adhesion  2003    Dr. Elpidio Anis  . Trigger finger release  2009    right pinkie finger  . Anterior cervical decomp/discectomy fusion  07/07/2012    Procedure: ANTERIOR CERVICAL DECOMPRESSION/DISCECTOMY FUSION 2 LEVELS;  Surgeon: Karn Cassis, MD;  Location: [facility];  Service: Neurosurgery;  Laterality: N/A;  Cervical four-five, five - six  Anterior cervical decompression/diskectomy/fusion/plate  . Partial hysterectomy  1990s?  [place]  . Spinal cord stimulator implant    . Oophorectomy    . Laparoscopy N/A 07/29/2013    Procedure: diagnostic laporoscopy;  Surgeon: Ardeth Sportsman, MD;  Location: [facility];  Service: General;  Laterality: N/A;  . Lysis of adhesion N/A 07/29/2013    Procedure: LYSIS OF ADHESION;  Surgeon: Ardeth Sportsman, MD;  Location: [facility];  Service: General;  Laterality: N/A;  . Bowel resection N/A 07/29/2013    Procedure:  serosal repair;  Surgeon: Ardeth Sportsman, MD;  Location: [facility];  Service: General;  Laterality: N/A;   Family History  Problem Relation Age of Onset  . Lung cancer Father   . Stomach cancer Father   . Cancer Father   . Alcohol abuse Father   . Mental illness Father   . Diabetes Sister   . Hypertension Sister   . Bipolar disorder Sister   . Schizophrenia Sister   . Diabetes Sister   . Alcohol abuse Brother   . Hypertension Brother   . Kidney disease Brother   . Diabetes Brother   . Drug abuse Brother   . Mental illness Brother   . Alcohol abuse Brother   . Alcohol abuse Brother   . Hypertension Brother   . Diabetes Brother   . Alcohol abuse Brother   . Physical abuse Mother   . Alcohol abuse Mother   . Mental illness Brother   . ADD / ADHD Neg Hx   . Anxiety disorder Neg Hx   . Dementia Neg Hx   . Depression Neg Hx   . OCD Neg Hx   . Seizures Neg Hx   . Paranoid behavior Neg Hx   . Drug abuse Sister   . Alcohol abuse Brother    History  Substance Use Topics  . Smoking status: Never Smoker   . Smokeless tobacco: Never Used  . Alcohol Use: No   OB History   Grav Para Term Preterm Abortions TAB SAB Ect Mult Living                 Review of Systems  Constitutional: Negative for fever and chills.  HENT: Negative for trouble swallowing and neck stiffness.   Respiratory: Positive for shortness of breath.   Cardiovascular: Positive for chest pain.  Gastrointestinal: Positive for nausea, vomiting, abdominal pain and constipation.  Genitourinary: Positive for decreased urine volume.  Neurological: Positive for weakness. Negative for dizziness.  All other systems reviewed and are negative.    Allergies  Neurontin; Amoxicillin; Ibuprofen; Penicillins; and Latex  Home Medications   Current Outpatient Rx  Name  Route  Sig  Dispense  Refill  . ALPRAZolam (XANAX) 0.5 MG tablet   Oral   Take 0.5 mg by mouth 3 (three) times daily as needed for sleep or anxiety.          Marland Kitchen amLODipine (NORVASC) 10 MG tablet   Oral   Take 10 mg by mouth every morning.         . Biotin (BIOTIN MAXIMUM STRENGTH) 5 MG CAPS   Oral   Take 10 mg by mouth daily.          Marland Kitchen bismuth-metronidazole-tetracycline (PYLERA) 140-125-125 MG per capsule   Oral   Take 3 capsules by mouth 4 (four) times daily.         . budesonide-formoterol (SYMBICORT) 160-4.5 MCG/ACT inhaler   Inhalation   Inhale 2 puffs into the lungs 2 (two) times daily.         Marland Kitchen  carbamazepine (EQUETRO) 200 MG CP12 12 hr capsule   Oral   Take 100-200 mg by mouth 2 (two) times daily. Patient takes 100mg  in am and 200mg  in pm of Equetro for bipolar issues Uses 100mg  capsules         . cetirizine (ZYRTEC) 10 MG tablet   Oral   Take 10 mg by mouth daily.         . Cholecalciferol (VITAMIN D-1000 MAX ST) 1000 UNITS tablet   Oral   Take 1,000 Units by mouth daily.          . diclofenac sodium (VOLTAREN) 1 % GEL   Topical   Apply 2 g topically 4 (four) times daily as needed. Pain         . fluticasone (FLONASE) 50 MCG/ACT nasal spray   Nasal   Place 2 sprays into the nose daily.         Marland Kitchen lubiprostone (AMITIZA) 24 MCG capsule   Oral   Take 24 mcg by mouth 2 (two) times daily with a meal.         . metFORMIN (GLUCOPHAGE-XR) 500 MG 24 hr tablet   Oral   Take 500 mg by mouth daily with breakfast.         . montelukast (SINGULAIR) 10 MG tablet      TAKE 1 TABLET (10 MG TOTAL) BY MOUTH AT BEDTIME.   30 tablet   0   . Multiple Vitamin (MULTIVITAMIN WITH MINERALS) TABS   Oral   Take 1 tablet by mouth daily.         Marland Kitchen oxyCODONE (ROXICODONE) 15 MG immediate release tablet   Oral   Take 15 mg by mouth every 4 (four) hours as needed. For breakthrough pain         . pantoprazole (PROTONIX) 40 MG tablet   Oral   Take 40 mg by mouth daily.         . potassium chloride (K-DUR,KLOR-CON) 10 MEQ tablet   Oral   Take 20 mEq by mouth 2 (two) times daily.         Marland Kitchen  tiZANidine (ZANAFLEX) 4 MG tablet   Oral   Take 4 mg by mouth every 6 (six) hours as needed (muscle spasms for 30 days).         . topiramate (TOPAMAX) 100 MG tablet   Oral   Take 100 mg by mouth at bedtime.         . triamterene-hydrochlorothiazide (MAXZIDE) 75-50 MG per tablet   Oral   Take 1 tablet by mouth at bedtime.         . cyclobenzaprine (FLEXERIL) 10 MG tablet   Oral   Take 10 mg by mouth 3 (three) times daily as needed for muscle spasms.         . fentaNYL (DURAGESIC) 12 MCG/HR   Transdermal   Place 1 patch onto the skin every other day.          . ipratropium (ATROVENT) 0.02 % nebulizer solution   Nebulization   Take 500 mcg by nebulization every 6 (six) hours as needed for wheezing.         . lidocaine (XYLOCAINE) 5 % ointment   Topical   Apply 1 application topically as needed (pain).          . naproxen (NAPROSYN) 500 MG tablet   Oral   Take 1 tablet (500 mg total) by mouth 2 (two) times daily as needed (for  pain/soreness).   40 tablet   1   . Olopatadine HCl (PATADAY) 0.2 % SOLN   Both Eyes   Place 1 drop into both eyes daily.          . rizatriptan (MAXALT-MLT) 10 MG disintegrating tablet   Oral   Take 10 mg by mouth 2 (two) times daily as needed for migraine. May repeat in 2 hours if needed          BP 114/72  Pulse 85  Temp(Src) 98.4 F (36.9 C) (Oral)  Resp 20  SpO2 98%  LMP 07/30/2013 Physical Exam  Nursing note and vitals reviewed. Constitutional: She is oriented to person, place, and time. She appears well-developed and well-nourished. No distress.  Patient found lying in bed on her right side, patient in pain  HENT:  Head: Normocephalic and atraumatic.  Dry mucous membranes  Eyes: Conjunctivae and EOM are normal. Pupils are equal, round, and reactive to light. Right eye exhibits no discharge. Left eye exhibits no discharge.  Neck: Normal range of motion. Neck supple. No tracheal deviation present.  Negative neck  stiffness Negative nuchal rigidity Negative lymphadenopathy  Cardiovascular: Regular rhythm and normal heart sounds.  Exam reveals no friction rub.   No murmur heard. Pulses:      Radial pulses are 2+ on the right side, and 2+ on the left side.       Dorsalis pedis pulses are 2+ on the right side, and 2+ on the left side.  Mild tachycardia identified upon auscultation  Pulmonary/Chest: Effort normal and breath sounds normal. No respiratory distress. She has no wheezes. She has no rales. She exhibits tenderness.  Pain reproducible upon palpation to the chest wall  Abdominal: Soft. She exhibits distension. There is generalized tenderness. There is rigidity.    Decreased bowel sounds upon auscultation Distention of abdomen Abdomen hard, tender upon palpation in all 4 quadrants Laparoscopic incision areas identified on the abdomen with negative findings for infection   Lymphadenopathy:    She has no cervical adenopathy.  Neurological: She is alert and oriented to person, place, and time. She exhibits normal muscle tone. Coordination normal.  Skin: Skin is warm and dry. No rash noted. She is not diaphoretic. No erythema.  Psychiatric: She has a normal mood and affect. Her behavior is normal. Thought content normal.    ED Course  Procedures (including critical care time)  7:29 PM Spoke with Dr. Thalia Bloodgood, CCS General Surgery, discussed case regarding SBO - General Surgery to see patient and consult.   7:37 PM Spoke with patient and husband regarding labs and imaging results. Ordered NG tube and discussed placement with patient. Pain medications ordered and antiemetics. Discussed that patient will need to be admitted to the hospital.    7:48 PM Spoke with [facility] regarding admission for MedSurg - General Surgery to Consult.   8:07 PM Spoke with Dr. Ezzard Standing, requested that he will see the patient first and admit. Spoke with [facility] that Dr. Ezzard Standing will assess and admit  patient.    Date: 08/08/2013  Rate: 66  Rhythm: normal sinus rhythm  QRS Axis: normal  Intervals: normal  ST/T Wave abnormalities: normal  Conduction Disutrbances:none  Narrative Interpretation: LVH and LAE  Old EKG Reviewed: unchanged when compared to 07/19/2013 EKG analyzed and reviewed by this provider and attending physician   Labs Review Labs Reviewed  CBC WITH DIFFERENTIAL - Abnormal; Notable for the following:    Monocytes Relative 13 (*)    All  other components within normal limits  COMPREHENSIVE METABOLIC PANEL - Abnormal; Notable for the following:    Glucose, Bld 113 (*)    GFR calc non Af Amer 65 (*)    GFR calc Af Amer 75 (*)    All other components within normal limits  URINALYSIS, ROUTINE W REFLEX MICROSCOPIC - Abnormal; Notable for the following:    Color, Urine AMBER (*)    APPearance CLOUDY (*)    Specific Gravity, Urine 1.035 (*)    Bilirubin Urine SMALL (*)    Ketones, ur 40 (*)    All other components within normal limits  D-DIMER, QUANTITATIVE - Abnormal; Notable for the following:    D-Dimer, Quant 2.73 (*)    All other components within normal limits  LIPASE, BLOOD  PREGNANCY, URINE  CG4 I-STAT (LACTIC ACID)  POCT I-STAT TROPONIN I   Imaging Review Ct Angio Chest Pe W/cm &/or Wo Cm  08/08/2013   CLINICAL DATA:  Patient with history of laparoscopic surgery 9/26. Abdominal pain and bloating. Pulmonary embolism.  EXAM: CT ANGIOGRAPHY CHEST WITH CONTRAST  TECHNIQUE: Multidetector CT imaging of the chest was performed using the standard protocol during bolus administration of intravenous contrast. Multiplanar CT image reconstructions including MIPs were obtained to evaluate the vascular anatomy.  CONTRAST:  OMNIPAQUE IOHEXOL 350 MG/ML SOLN  COMPARISON:  10/12/2009.  FINDINGS: There is some bolus dispersion on the examination. There is no central pulmonary embolus. The subsegmental vessels are not evaluated due to bolus dispersion. No evidence of right  heart strain. No adenopathy. No effusion. Spinal stimulator leads terminate in the mid thoracic spine. Central airways and lungs are clear. No aggressive osseous lesions are present. There is reflux of contrast injected in the left upper extremity into a Sue pre matter costal vein and inferiorly in a left paravertebral vein.  Review of the MIP images confirms the above findings.  IMPRESSION: No central pulmonary embolus identified. Study mildly degraded by bolus dispersion.   Electronically Signed   By: Andreas Newport M.D.   On: 08/08/2013 18:51   Ct Abdomen Pelvis W Contrast  08/08/2013   CLINICAL DATA:  History of laparoscopic surgery 9/26. Recent lysis of adhesions. Abdominal pain.  EXAM: CT ABDOMEN AND PELVIS WITH CONTRAST  TECHNIQUE: Multidetector CT imaging of the abdomen and pelvis was performed using the standard protocol following bolus administration of intravenous contrast.  CONTRAST:  OMNIPAQUE IOHEXOL 350 MG/ML SOLN  COMPARISON:  04/27/2013.  FINDINGS: Liver:  Normal.  Spleen:  Normal.  Gallbladder:  Normal.  Common bile duct:  Normal.  Pancreas:  Normal.  Adrenal glands:  Normal bilaterally.  Kidneys: Normal enhancement and delayed excretion of contrast. Both ureters appear within normal limits.  Stomach:  Normal.  No inflammatory changes.  Small bowel: The duodenum is within normal limits. There is no dilation proximal to the ligament of Treitz. Shortly after the ligament of Treitz, small bowel all fecalization is present in the proximal jejunum and small bowel becomes dilated with air-fluid levels. Small mesenteric lymph nodes are present which are probably reactive. Small bowel dilation measures up to 47 mm. There is a transition point in the central abdomen (image 125 series 4) with relative decompression of distal small bowel. Small to moderate amount of free fluid in the anatomic pelvis.  Colon: Moderate amount of stool is present within the ascending colon. The transverse colon and  descending colon are decompressed.  Pelvic Genitourinary:  Uterus appears within normal limits.  Bones: No aggressive  osseous lesions. L4-L5 posterior lumbar interbody fusion. Posterior lateral fusion mass extends from L4 through S1 and the fusion appears solid. Spinal stimulator leads terminate in the mid thoracic spine.  Vasculature: Normal.  Body Wall: Midline abdominal scar.  Significantly, there is no intra-abdominal fluid collection suspicious for abscess. Small ileocolic lymph nodes incidentally noted.  IMPRESSION: 1. Recurrent small bowel obstruction. Transition point in the central abdomen involving either the distal jejunum or proximal ileum. This may be early high grade or partial based on stool within the colon. 2. Small to moderate matter free fluid in the anatomic pelvis, likely reactive. No intra-abdominal abscess.   Electronically Signed   By: Andreas Newport M.D.   On: 08/08/2013 19:03   Dg Abd Acute W/chest  08/08/2013   CLINICAL DATA:  Abdominal pain for 1 week. Recent surgery 1 week ago.  EXAM: ACUTE ABDOMEN SERIES (ABDOMEN 2 VIEW & CHEST 1 VIEW)  COMPARISON:  07/19/2013 chest radiograph. Abdominal CT 04/27/2013.  FINDINGS: ACDF in the cervical spine and spinal stimulator in the thoracic spine. There is no active cardiopulmonary disease.  No free air underneath the hemidiaphragms. Dilated loops of small bowel are present with multiple air-fluid levels. There is minimal colonic dilation/distension. Paucity of distal bowel gas. PLIF at L4-L5.  IMPRESSION: 1. Dilated loops of small bowel with multiple air-fluid levels. Maximal small bowel diameter is 4 cm. In the postoperative setting, ileus is more common however the lack of colonic distension favors small bowel obstruction over ileus. 2. Postoperative changes of the spine.   Electronically Signed   By: Andreas Newport M.D.   On: 08/08/2013 17:05    MDM   1. SBO (small bowel obstruction)   2. S/P laparoscopic procedure     Patient  presenting to emergency department with abdominal pain has been ongoing since laparoscopic surgery performed for lysis of adhesions, performed on 07/29/2013 by Dr. Gordy Savers. Patient presenting with abdominal pain that has been continuous, reported bloating and feeling of hardness to the abdomen, constant pain. Patient has not had a bowel movement since Tuesday this past week. Patient reported decrease in urination. Patient reports she is unable to keep any fluid down, reported constant nausea and emesis. Reported that she had at least 3-4 episodes of emesis today. Stated that she's been having chest pain for the past couple of days, described as a tightness and pressure to bilateral aspects of the chest. Denied fever, chills, back pain, neck stiffness. Alert and oriented. Patient appears to be in pain, colicky on bed. Heart rate and rhythm identified, heart rate mildly elevated. Pulses palpable and strong, distal and proximal. Lungs clear to auscultation bilaterally. Bowel sounds decreased in all 4 quadrants. Distention of abdomen identified. Abdomen hard upon palpation, pain diffuse. Laparoscopic incisions identified, proper healing identified, negative signs of infection.  EKG negative for ischemic findings, negative changes identified. Troponins negative elevation. CBC negative elevation white blood cell count, negative leukocytosis identified. CMP negative findings identified - negative elevation in ALT, AST, alkaline phosphatase. Lipase negative elevation. Acute abdomen and chest noted dilated loops of small bowel with multiple air-fluid levels, ileus is more common however lack of colonic distention fever small bowel obstruction. Lactic acid negative elevation. Urine negative for nitrites, negative leukocytosis identified. D-dimer mildly elevated 2.73-most likely due to surgery that was performed. Urine pregnancy negative. CT abdomen noted SBO - NG tube placed. CT angio of chest negative for PE.  Dr. Ezzard Standing  to see patient - seen and assessed by Dr. Thalia Bloodgood,  general surgery - patient to be admitted to the hospital, Medsurg as inpatient for SBO after s/p laparoscopic surgery for lysis of adhesions. Discussed plan for admission with patient and husband - understood and agreed to plan. Patient stable for transfer.       Raymon Mutton, PA-C 08/10/13 [phone]

## 2013-08-08 NOTE — ED Notes (Signed)
Pt states that she had laparoscopic surgery on 9/26.  States that she has not "been right since".  C/o gen abd pain and bloating.  States that she has not had a BM since Tuesday.  C/o NV.

## 2013-08-09 ENCOUNTER — Ambulatory Visit: Payer: Managed Care, Other (non HMO) | Admitting: Family Medicine

## 2013-08-09 ENCOUNTER — Inpatient Hospital Stay (HOSPITAL_COMMUNITY): Payer: Managed Care, Other (non HMO)

## 2013-08-09 DIAGNOSIS — K56609 Unspecified intestinal obstruction, unspecified as to partial versus complete obstruction: Secondary | ICD-10-CM

## 2013-08-09 HISTORY — DX: Unspecified intestinal obstruction, unspecified as to partial versus complete obstruction: K56.609

## 2013-08-09 LAB — GLUCOSE, CAPILLARY
Glucose-Capillary: 101 mg/dL — ABNORMAL HIGH (ref 70–99)
Glucose-Capillary: 69 mg/dL — ABNORMAL LOW (ref 70–99)
Glucose-Capillary: 89 mg/dL (ref 70–99)
Glucose-Capillary: 92 mg/dL (ref 70–99)
Glucose-Capillary: 94 mg/dL (ref 70–99)

## 2013-08-09 LAB — BASIC METABOLIC PANEL
BUN: 13 mg/dL (ref 6–23)
CO2: 23 mEq/L (ref 19–32)
Chloride: 107 mEq/L (ref 96–112)
Creatinine, Ser: 0.91 mg/dL (ref 0.50–1.10)

## 2013-08-09 LAB — CBC
HCT: 32.5 % — ABNORMAL LOW (ref 36.0–46.0)
Hemoglobin: 10.8 g/dL — ABNORMAL LOW (ref 12.0–15.0)
MCHC: 33.2 g/dL (ref 30.0–36.0)
MCV: 92.9 fL (ref 78.0–100.0)
RDW: 13 % (ref 11.5–15.5)

## 2013-08-09 MED ORDER — PHENOL 1.4 % MT LIQD
2.0000 | OROMUCOSAL | Status: DC | PRN
  Administered 2013-08-10: 2 via OROMUCOSAL
  Filled 2013-08-09: qty 177

## 2013-08-09 MED ORDER — BISACODYL 10 MG RE SUPP
10.0000 mg | Freq: Every day | RECTAL | Status: DC
  Administered 2013-08-09 – 2013-08-16 (×8): 10 mg via RECTAL
  Filled 2013-08-09 (×8): qty 1

## 2013-08-09 MED ORDER — KCL IN DEXTROSE-NACL 20-5-0.45 MEQ/L-%-% IV SOLN
INTRAVENOUS | Status: AC
  Administered 2013-08-09 – 2013-08-12 (×6): via INTRAVENOUS
  Filled 2013-08-09 (×9): qty 1000

## 2013-08-09 MED ORDER — ACETAMINOPHEN 650 MG RE SUPP
650.0000 mg | Freq: Four times a day (QID) | RECTAL | Status: DC | PRN
  Administered 2013-08-12: 650 mg via RECTAL
  Filled 2013-08-09: qty 1

## 2013-08-09 MED ORDER — MAGIC MOUTHWASH
15.0000 mL | Freq: Four times a day (QID) | ORAL | Status: DC | PRN
  Filled 2013-08-09: qty 15

## 2013-08-09 MED ORDER — DIPHENHYDRAMINE HCL 50 MG/ML IJ SOLN
12.5000 mg | Freq: Four times a day (QID) | INTRAMUSCULAR | Status: DC | PRN
  Administered 2013-08-24: 12.5 mg via INTRAVENOUS
  Filled 2013-08-09: qty 1

## 2013-08-09 MED ORDER — ONDANSETRON HCL 4 MG/2ML IJ SOLN
4.0000 mg | Freq: Four times a day (QID) | INTRAMUSCULAR | Status: DC
  Administered 2013-08-09 – 2013-08-15 (×24): 4 mg via INTRAVENOUS
  Filled 2013-08-09 (×25): qty 2

## 2013-08-09 MED ORDER — DEXTROSE 50 % IV SOLN
25.0000 mL | Freq: Once | INTRAVENOUS | Status: AC | PRN
  Administered 2013-08-09: 10 mL via INTRAVENOUS

## 2013-08-09 MED ORDER — METOCLOPRAMIDE HCL 5 MG/ML IJ SOLN
5.0000 mg | Freq: Four times a day (QID) | INTRAMUSCULAR | Status: DC | PRN
  Administered 2013-08-10 (×2): 10 mg via INTRAVENOUS
  Filled 2013-08-09: qty 2

## 2013-08-09 MED ORDER — SACCHAROMYCES BOULARDII 250 MG PO CAPS
250.0000 mg | ORAL_CAPSULE | Freq: Two times a day (BID) | ORAL | Status: DC
  Administered 2013-08-09 – 2013-08-10 (×4): 250 mg via ORAL
  Filled 2013-08-09 (×6): qty 1

## 2013-08-09 MED ORDER — LORAZEPAM 2 MG/ML IJ SOLN
0.5000 mg | Freq: Three times a day (TID) | INTRAMUSCULAR | Status: DC | PRN
  Administered 2013-08-09 – 2013-08-13 (×2): 1 mg via INTRAVENOUS
  Filled 2013-08-09 (×2): qty 1

## 2013-08-09 MED ORDER — LACTATED RINGERS IV BOLUS (SEPSIS): 1000.0000 mL | Freq: Three times a day (TID) | INTRAVENOUS | Status: AC | PRN

## 2013-08-09 MED ORDER — MENTHOL 3 MG MT LOZG
1.0000 | LOZENGE | OROMUCOSAL | Status: DC | PRN
  Filled 2013-08-09: qty 9

## 2013-08-09 MED ORDER — ALUM & MAG HYDROXIDE-SIMETH 200-200-20 MG/5ML PO SUSP
30.0000 mL | Freq: Four times a day (QID) | ORAL | Status: DC | PRN
  Administered 2013-08-15 (×2): 30 mL via ORAL
  Filled 2013-08-09 (×2): qty 30

## 2013-08-09 MED ORDER — DICLOFENAC SODIUM 1 % TD GEL: 2.0000 g | Freq: Four times a day (QID) | TRANSDERMAL | Status: DC | PRN

## 2013-08-09 MED ORDER — MORPHINE SULFATE 2 MG/ML IJ SOLN
1.0000 mg | INTRAMUSCULAR | Status: DC | PRN
  Administered 2013-08-09: 2 mg via INTRAVENOUS
  Administered 2013-08-09 (×3): 4 mg via INTRAVENOUS
  Administered 2013-08-09: 2 mg via INTRAVENOUS
  Administered 2013-08-10 (×4): 4 mg via INTRAVENOUS
  Filled 2013-08-09: qty 2
  Filled 2013-08-09: qty 1
  Filled 2013-08-09 (×6): qty 2
  Filled 2013-08-09: qty 1
  Filled 2013-08-09: qty 2

## 2013-08-09 MED ORDER — METOPROLOL TARTRATE 1 MG/ML IV SOLN
5.0000 mg | Freq: Four times a day (QID) | INTRAVENOUS | Status: DC | PRN
  Filled 2013-08-09: qty 5

## 2013-08-09 MED ORDER — LACTATED RINGERS IV BOLUS (SEPSIS)
1000.0000 mL | Freq: Once | INTRAVENOUS | Status: AC
  Administered 2013-08-09: 1000 mL via INTRAVENOUS

## 2013-08-09 MED ORDER — LIP MEDEX EX OINT
1.0000 "application " | TOPICAL_OINTMENT | Freq: Two times a day (BID) | CUTANEOUS | Status: DC
  Administered 2013-08-09 – 2013-09-01 (×45): 1 via TOPICAL
  Filled 2013-08-09 (×2): qty 7

## 2013-08-09 MED ORDER — DEXTROSE 50 % IV SOLN
INTRAVENOUS | Status: AC
  Filled 2013-08-09: qty 50

## 2013-08-09 NOTE — Progress Notes (Signed)
Hypoglycemic Event  CBG: 69  Treatment: D50 IV 25 mL  Symptoms: None  Follow-up CBG: Time:1641 CBG Result:101  Possible Reasons for Event: Other: NG tube, Pt NPO x ice chips  Comments/MD notified:MD notified, New order to add D5 to pt's fluids    Jane English D  Remember to initiate Hypoglycemia Order Set & complete

## 2013-08-09 NOTE — Progress Notes (Signed)
Jane English [id number] 1971/01/05  CARE TEAM:  PCP: Syliva Overman, MD  Outpatient Care Team: Patient Care Team: Kerri Perches, MD as PCP - General Ardeth Sportsman, MD as Consulting Physician (General Surgery) Karn Cassis, MD as Consulting Physician (Neurosurgery) Beverley Fiedler, MD as Consulting Physician (Gastroenterology)  Inpatient Treatment Team: Treatment Team: Attending Provider: Ardeth Sportsman, MD; Registered Nurse: Dina Rich, RN; Technician: Vella Raring, NT; Respiratory Therapist: Cloyd Stagers, Rad Tech   Subjective:  Patient is frustrated.  Have partially finished full liquid tray in room.  ?Order was for n.p.o.  Husband in room.  He is worried that her chronic pain medications are contributing to her bowel problems.  Patient feels rather full/distended.  Morphine helping.  Not comfortable to walk around.  No flatus or bowel movement.  Has had some nausea.  She claims to have been struggling for the past six days.  We have talked with her 5 days ago.  Did not hear anything since.    Objective:  Vital signs:  Filed Vitals:   08/08/13 1924 08/08/13 2128 08/08/13 2201 08/09/13 0502  BP: 114/72 117/66 135/81 102/65  Pulse: 84 93 91 83  Temp: 98.4 F (36.9 C) 98.5 F (36.9 C) 97.9 F (36.6 C) 97.9 F (36.6 C)  TempSrc: Oral Oral Oral Oral  Resp: 12 20 18 16   Height:   5\' 6"  (1.676 m)   Weight:   164 lb 14.5 oz (74.8 kg)   SpO2: 98% 96% 99% 99%    Last BM Date: 08/03/13  Intake/Output   Yesterday:  10/05 0701 - 10/06 0700 In: 1170 [P.O.:30; I.V.:1140] Out: 300 [Urine:300] This shift:     Bowel function:  Flatus: n  BM: n  Drain: n/a  Physical Exam:  General: Pt awake/alert/oriented x4 in no acute distress Eyes: PERRL, normal EOM.  Sclera clear.  No icterus Neuro: CN II-XII intact w/o focal sensory/motor deficits. Lymph: No head/neck/groin lymphadenopathy Psych:  No delerium/psychosis/paranoia.   Breast/frustrated HENT: Normocephalic, Mucus membranes moist.  No thrush Neck: Supple, No tracheal deviation Chest: No chest wall pain w good excursion CV:  Pulses intact.  Regular rhythm MS: Normal AROM mjr joints.  No obvious deformity Abdomen: Soft.  Obese.  Moderately distended.  Mostly nontender except for centrally with occasional sharp pains.  No true peritonitis.  No evidence of peritonitis.  No incarcerated hernias. Ext:  SCDs BLE.  No mjr edema.  No cyanosis Skin: No petechiae / purpura   Problem List:   Principal Problem:   SBO (small bowel obstruction) Active Problems:   Chronic pain syndrome   Anxiety state, unspecified   DEPRESSION, CHRONIC   Chronic constipation   PTSD (post-traumatic stress disorder)  SURGERY 07/29/2013 POST-OPERATIVE DIAGNOSIS: recurrent abdominal pain,extensive adhesions   PROCEDURE: Procedure(s):  diagnostic laporoscopy  LYSIS OF ADHESIONS  serosal repair  SURGEON: Surgeon(s):  Ardeth Sportsman, MD  Kandis Cocking, MD - Asst      Assessment  Jane English  42 y.o. female       Ileus vs. Partial small bowel obstruction in patient with chronic pain on chronic narcotics with significant lesions, 11 days status post laparoscopic lysis of adhesions  Plan:  -Continue IV fluid hydration.  At one point she is claiming she can keep nothing down for the past week.  Then she can tolerate her pills and liquids.  BUN and creatinine argues against severe dehydration leading to renal failure, but err on the side  of caution and keep her well hydrated.  Picture is confusing.  Full liquids in room.  Patient claims severe dehydration but normal labs against this.  Patient claims hematemesis and other problems.  We will see what nasogastric tube shows.  IV PPI and follow  Because she is miserable with her distention, place nasogastric tube for decompression.  Best shot to help the partial bowel instructions/ileus resolve.  Left colon decompressed.   Doubt enema will help.  Continue suppositories to help stimulate return of bowel function.  Hold narcotics.  Would not be able to tolerate them anyway.  Just IV morphine sparingly if possible.  I do agree she needs to get back to her chronic pain specialist at [facility] at some point, but this is not a great time to radically change her regimen if possible.  Hold off on palliative care consult at this time as may be confusing.  Poor time to try and operate on her.  No tachycardia, fever, leukocytosis, definite peritonitis.  Therefore, hold off.  Suspect is related to inflammation and should resolve over time.  I updated the patient's status to she & her husband.  Recommendations were made.  Questions were answered.  The family expressed understanding & appreciation.   -VTE prophylaxis- SCDs, etc  -mobilize as tolerated to help recovery  Ardeth Sportsman, M.D., F.A.C.S. Gastrointestinal and Minimally Invasive Surgery [facility] [address], [address] [place], [place] [address] [phone] Main / Paging   08/09/2013   Results:   Labs: Results for orders placed during the hospital encounter of 08/08/13 (from the past 48 hour(s))  CBC WITH DIFFERENTIAL     Status: Abnormal   Collection Time    08/08/13  4:00 PM      Result Value Range   WBC 6.5  4.0 - 10.5 K/uL   RBC 4.30  3.87 - 5.11 MIL/uL   Hemoglobin 13.4  12.0 - 15.0 g/dL   HCT 30.8  65.7 - 84.6 %   MCV 91.9  78.0 - 100.0 fL   MCH 31.2  26.0 - 34.0 pg   MCHC 33.9  30.0 - 36.0 g/dL   RDW 96.2  95.2 - 84.1 %   Platelets 367  150 - 400 K/uL   Neutrophils Relative % 51  43 - 77 %   Neutro Abs 3.3  1.7 - 7.7 K/uL   Lymphocytes Relative 33  12 - 46 %   Lymphs Abs 2.2  0.7 - 4.0 K/uL   Monocytes Relative 13 (*) 3 - 12 %   Monocytes Absolute 0.8  0.1 - 1.0 K/uL   Eosinophils Relative 2  0 - 5 %   Eosinophils Absolute 0.2  0.0 - 0.7 K/uL   Basophils Relative 1  0 - 1 %   Basophils Absolute 0.0   0.0 - 0.1 K/uL  COMPREHENSIVE METABOLIC PANEL     Status: Abnormal   Collection Time    08/08/13  4:00 PM      Result Value Range   Sodium 140  135 - 145 mEq/L   Potassium 3.7  3.5 - 5.1 mEq/L   Chloride 101  96 - 112 mEq/L   CO2 26  19 - 32 mEq/L   Glucose, Bld 113 (*) 70 - 99 mg/dL   BUN 16  6 - 23 mg/dL   Creatinine, Ser 3.24  0.50 - 1.10 mg/dL   Calcium 40.1  8.4 - 02.7 mg/dL   Total Protein 7.9  6.0 - 8.3 g/dL   Albumin 4.2  3.5 - 5.2 g/dL   AST 14  0 - 37 U/L   ALT 10  0 - 35 U/L   Alkaline Phosphatase 63  39 - 117 U/L   Total Bilirubin 0.3  0.3 - 1.2 mg/dL   GFR calc non Af Amer 65 (*) >90 mL/min   GFR calc Af Amer 75 (*) >90 mL/min   Comment: (NOTE)     The eGFR has been calculated using the CKD EPI equation.     This calculation has not been validated in all clinical situations.     eGFR's persistently <90 mL/min signify possible Chronic Kidney     Disease.  LIPASE, BLOOD     Status: None   Collection Time    08/08/13  4:00 PM      Result Value Range   Lipase 50  11 - 59 U/L  D-DIMER, QUANTITATIVE     Status: Abnormal   Collection Time    08/08/13  4:48 PM      Result Value Range   D-Dimer, Quant 2.73 (*) 0.00 - 0.48 ug/mL-FEU   Comment:            AT THE INHOUSE ESTABLISHED CUTOFF     VALUE OF 0.48 ug/mL FEU,     THIS ASSAY HAS BEEN DOCUMENTED     IN THE LITERATURE TO HAVE     A SENSITIVITY AND NEGATIVE     PREDICTIVE VALUE OF AT LEAST     98 TO 99%.  THE TEST RESULT     SHOULD BE CORRELATED WITH     AN ASSESSMENT OF THE CLINICAL     PROBABILITY OF DVT / VTE.  URINALYSIS, ROUTINE W REFLEX MICROSCOPIC     Status: Abnormal   Collection Time    08/08/13  5:13 PM      Result Value Range   Color, Urine AMBER (*) YELLOW   Comment: BIOCHEMICALS MAY BE AFFECTED BY COLOR   APPearance CLOUDY (*) CLEAR   Specific Gravity, Urine 1.035 (*) 1.005 - 1.030   pH 5.5  5.0 - 8.0   Glucose, UA NEGATIVE  NEGATIVE mg/dL   Hgb urine dipstick NEGATIVE  NEGATIVE   Bilirubin  Urine SMALL (*) NEGATIVE   Ketones, ur 40 (*) NEGATIVE mg/dL   Protein, ur NEGATIVE  NEGATIVE mg/dL   Urobilinogen, UA 0.2  0.0 - 1.0 mg/dL   Nitrite NEGATIVE  NEGATIVE   Leukocytes, UA NEGATIVE  NEGATIVE   Comment: MICROSCOPIC NOT DONE ON URINES WITH NEGATIVE PROTEIN, BLOOD, LEUKOCYTES, NITRITE, OR GLUCOSE <1000 mg/dL.  POCT I-STAT TROPONIN I     Status: None   Collection Time    08/08/13  5:33 PM      Result Value Range   Troponin i, poc 0.00  0.00 - 0.08 ng/mL   Comment 3            Comment: Due to the release kinetics of cTnI,     a negative result within the first hours     of the onset of symptoms does not rule out     myocardial infarction with certainty.     If myocardial infarction is still suspected,     repeat the test at appropriate intervals.  CG4 I-STAT (LACTIC ACID)     Status: None   Collection Time    08/08/13  5:35 PM      Result Value Range   Lactic Acid, Venous 1.40  0.5 - 2.2 mmol/L  PREGNANCY, URINE     Status: None   Collection Time    08/08/13  6:24 PM      Result Value Range   Preg Test, Ur NEGATIVE  NEGATIVE   Comment:            THE SENSITIVITY OF THIS     METHODOLOGY IS >20 mIU/mL.  GLUCOSE, CAPILLARY     Status: Abnormal   Collection Time    08/08/13 10:35 PM      Result Value Range   Glucose-Capillary 108 (*) 70 - 99 mg/dL  GLUCOSE, CAPILLARY     Status: None   Collection Time    08/09/13  5:05 AM      Result Value Range   Glucose-Capillary 78  70 - 99 mg/dL  BASIC METABOLIC PANEL     Status: Abnormal   Collection Time    08/09/13  5:10 AM      Result Value Range   Sodium 138  135 - 145 mEq/L   Potassium 3.5  3.5 - 5.1 mEq/L   Chloride 107  96 - 112 mEq/L   CO2 23  19 - 32 mEq/L   Glucose, Bld 89  70 - 99 mg/dL   BUN 13  6 - 23 mg/dL   Creatinine, Ser 1.19  0.50 - 1.10 mg/dL   Calcium 8.4  8.4 - 14.7 mg/dL   GFR calc non Af Amer 76 (*) >90 mL/min   GFR calc Af Amer 88 (*) >90 mL/min   Comment: (NOTE)     The eGFR has been  calculated using the CKD EPI equation.     This calculation has not been validated in all clinical situations.     eGFR's persistently <90 mL/min signify possible Chronic Kidney     Disease.  CBC     Status: Abnormal   Collection Time    08/09/13  5:10 AM      Result Value Range   WBC 5.1  4.0 - 10.5 K/uL   RBC 3.50 (*) 3.87 - 5.11 MIL/uL   Hemoglobin 10.8 (*) 12.0 - 15.0 g/dL   Comment: DELTA CHECK NOTED     REPEATED TO VERIFY   HCT 32.5 (*) 36.0 - 46.0 %   MCV 92.9  78.0 - 100.0 fL   MCH 30.9  26.0 - 34.0 pg   MCHC 33.2  30.0 - 36.0 g/dL   RDW 82.9  56.2 - 13.0 %   Platelets 288  150 - 400 K/uL    Imaging / Studies: Ct Angio Chest Pe W/cm &/or Wo Cm  08/08/2013   CLINICAL DATA:  Patient with history of laparoscopic surgery 9/26. Abdominal pain and bloating. Pulmonary embolism.  EXAM: CT ANGIOGRAPHY CHEST WITH CONTRAST  TECHNIQUE: Multidetector CT imaging of the chest was performed using the standard protocol during bolus administration of intravenous contrast. Multiplanar CT image reconstructions including MIPs were obtained to evaluate the vascular anatomy.  CONTRAST:  OMNIPAQUE IOHEXOL 350 MG/ML SOLN  COMPARISON:  10/12/2009.  FINDINGS: There is some bolus dispersion on the examination. There is no central pulmonary embolus. The subsegmental vessels are not evaluated due to bolus dispersion. No evidence of right heart strain. No adenopathy. No effusion. Spinal stimulator leads terminate in the mid thoracic spine. Central airways and lungs are clear. No aggressive osseous lesions are present. There is reflux of contrast injected in the left upper extremity into a Sue pre matter costal vein and inferiorly in a  left paravertebral vein.  Review of the MIP images confirms the above findings.  IMPRESSION: No central pulmonary embolus identified. Study mildly degraded by bolus dispersion.   Electronically Signed   By: Andreas Newport M.D.   On: 08/08/2013 18:51   Ct Abdomen Pelvis W  Contrast  08/08/2013   CLINICAL DATA:  History of laparoscopic surgery 9/26. Recent lysis of adhesions. Abdominal pain.  EXAM: CT ABDOMEN AND PELVIS WITH CONTRAST  TECHNIQUE: Multidetector CT imaging of the abdomen and pelvis was performed using the standard protocol following bolus administration of intravenous contrast.  CONTRAST:  OMNIPAQUE IOHEXOL 350 MG/ML SOLN  COMPARISON:  04/27/2013.  FINDINGS: Liver:  Normal.  Spleen:  Normal.  Gallbladder:  Normal.  Common bile duct:  Normal.  Pancreas:  Normal.  Adrenal glands:  Normal bilaterally.  Kidneys: Normal enhancement and delayed excretion of contrast. Both ureters appear within normal limits.  Stomach:  Normal.  No inflammatory changes.  Small bowel: The duodenum is within normal limits. There is no dilation proximal to the ligament of Treitz. Shortly after the ligament of Treitz, small bowel all fecalization is present in the proximal jejunum and small bowel becomes dilated with air-fluid levels. Small mesenteric lymph nodes are present which are probably reactive. Small bowel dilation measures up to 47 mm. There is a transition point in the central abdomen (image 125 series 4) with relative decompression of distal small bowel. Small to moderate amount of free fluid in the anatomic pelvis.  Colon: Moderate amount of stool is present within the ascending colon. The transverse colon and descending colon are decompressed.  Pelvic Genitourinary:  Uterus appears within normal limits.  Bones: No aggressive osseous lesions. L4-L5 posterior lumbar interbody fusion. Posterior lateral fusion mass extends from L4 through S1 and the fusion appears solid. Spinal stimulator leads terminate in the mid thoracic spine.  Vasculature: Normal.  Body Wall: Midline abdominal scar.  Significantly, there is no intra-abdominal fluid collection suspicious for abscess. Small ileocolic lymph nodes incidentally noted.  IMPRESSION: 1. Recurrent small bowel obstruction. Transition  point in the central abdomen involving either the distal jejunum or proximal ileum. This may be early high grade or partial based on stool within the colon. 2. Small to moderate matter free fluid in the anatomic pelvis, likely reactive. No intra-abdominal abscess.   Electronically Signed   By: Andreas Newport M.D.   On: 08/08/2013 19:03   Dg Abd Acute W/chest  08/08/2013   CLINICAL DATA:  Abdominal pain for 1 week. Recent surgery 1 week ago.  EXAM: ACUTE ABDOMEN SERIES (ABDOMEN 2 VIEW & CHEST 1 VIEW)  COMPARISON:  07/19/2013 chest radiograph. Abdominal CT 04/27/2013.  FINDINGS: ACDF in the cervical spine and spinal stimulator in the thoracic spine. There is no active cardiopulmonary disease.  No free air underneath the hemidiaphragms. Dilated loops of small bowel are present with multiple air-fluid levels. There is minimal colonic dilation/distension. Paucity of distal bowel gas. PLIF at L4-L5.  IMPRESSION: 1. Dilated loops of small bowel with multiple air-fluid levels. Maximal small bowel diameter is 4 cm. In the postoperative setting, ileus is more common however the lack of colonic distension favors small bowel obstruction over ileus. 2. Postoperative changes of the spine.   Electronically Signed   By: Andreas Newport M.D.   On: 08/08/2013 17:05    Medications / Allergies: per chart  Antibiotics: Anti-infectives   None

## 2013-08-10 LAB — CBC
HCT: 30.2 % — ABNORMAL LOW (ref 36.0–46.0)
MCV: 93.2 fL (ref 78.0–100.0)
Platelets: 294 10*3/uL (ref 150–400)
RBC: 3.24 MIL/uL — ABNORMAL LOW (ref 3.87–5.11)
RDW: 12.9 % (ref 11.5–15.5)
WBC: 5 10*3/uL (ref 4.0–10.5)

## 2013-08-10 LAB — BASIC METABOLIC PANEL
BUN: 7 mg/dL (ref 6–23)
Calcium: 8.4 mg/dL (ref 8.4–10.5)
Chloride: 105 mEq/L (ref 96–112)
Creatinine, Ser: 0.83 mg/dL (ref 0.50–1.10)
GFR calc Af Amer: >90 mL/min (ref >90)
GFR calc non Af Amer: 85 mL/min — ABNORMAL LOW (ref >90)
Potassium: 3.5 mEq/L (ref 3.5–5.1)
Sodium: 136 mEq/L (ref 135–145)

## 2013-08-10 LAB — GLUCOSE, CAPILLARY
Glucose-Capillary: 93 mg/dL (ref 70–99)
Glucose-Capillary: 109 mg/dL — ABNORMAL HIGH (ref 70–99)
Glucose-Capillary: 114 mg/dL — ABNORMAL HIGH (ref 70–99)

## 2013-08-10 MED ORDER — MORPHINE SULFATE 2 MG/ML IJ SOLN
2.0000 mg | INTRAMUSCULAR | Status: DC | PRN
  Administered 2013-08-10 – 2013-08-11 (×4): 4 mg via INTRAVENOUS
  Administered 2013-08-12 – 2013-08-15 (×9): 2 mg via INTRAVENOUS
  Administered 2013-08-16: 4 mg via INTRAVENOUS
  Filled 2013-08-10: qty 2
  Filled 2013-08-10 (×2): qty 1
  Filled 2013-08-10: qty 2
  Filled 2013-08-10: qty 1
  Filled 2013-08-10: qty 2
  Filled 2013-08-10 (×2): qty 1
  Filled 2013-08-10: qty 2
  Filled 2013-08-10: qty 1
  Filled 2013-08-10: qty 2
  Filled 2013-08-10: qty 1
  Filled 2013-08-10: qty 2
  Filled 2013-08-10 (×2): qty 1

## 2013-08-10 MED ORDER — LORAZEPAM 2 MG/ML IJ SOLN
1.0000 mg | Freq: Every day | INTRAMUSCULAR | Status: DC
  Administered 2013-08-10 – 2013-08-11 (×2): 1 mg via INTRAVENOUS
  Filled 2013-08-10 (×2): qty 1

## 2013-08-10 MED ORDER — HYOSCYAMINE SULFATE 0.125 MG SL SUBL
0.1250 mg | SUBLINGUAL_TABLET | Freq: Three times a day (TID) | SUBLINGUAL | Status: DC
  Administered 2013-08-10 – 2013-08-11 (×3): 0.125 mg via SUBLINGUAL
  Filled 2013-08-10 (×7): qty 1

## 2013-08-10 NOTE — Progress Notes (Signed)
CARRERA KIESEL [id number] 1970-12-03  CARE TEAM:  PCP: Syliva Overman, MD  Outpatient Care Team: Patient Care Team: Kerri Perches, MD as PCP - General Ardeth Sportsman, MD as Consulting Physician (General Surgery) Karn Cassis, MD as Consulting Physician (Neurosurgery) Beverley Fiedler, MD as Consulting Physician (Gastroenterology)  Inpatient Treatment Team: Treatment Team: Attending Provider: Ardeth Sportsman, MD; Registered Nurse: Dina Rich, RN; Technician: Vella Raring, NT; Registered Nurse: Jonathon Bellows, RN   Subjective:  Patient depressed, staying in bed.   Walked to bathroom. Abdomen still very sore but less painful.  Objective:  Vital signs:  Filed Vitals:   08/09/13 1354 08/09/13 2023 08/09/13 2200 08/10/13 0534  BP: 121/71  131/68 144/81  Pulse: 83  71 71  Temp: 98.3 F (36.8 C)  98.9 F (37.2 C) 98 F (36.7 C)  TempSrc: Oral  Oral Oral  Resp: 16  16 16   Height:      Weight:      SpO2: 98% 98% 100% 99%    Last BM Date: 08/03/13  Intake/Output   Yesterday:  10/06 0701 - 10/07 0700 In: 3760 [I.V.:2760; IV Piggyback:1000] Out: 950 [Urine:800; Emesis/NG output:150] This shift:  Total I/O In: 1500 [I.V.:1500] Out: 450 [Urine:450]  Bowel function:  Flatus: n  BM: n  Drain: n/a  Physical Exam:  General: Pt awake/alert/oriented x4 in no acute distress Eyes: PERRL, normal EOM.  Sclera clear.  No icterus Neuro: CN II-XII intact w/o focal sensory/motor deficits. Lymph: No head/neck/groin lymphadenopathy Psych:  No delerium/psychosis/paranoia.  Breast/frustrated HENT: Normocephalic, Mucus membranes moist.  No thrush Neck: Supple, No tracheal deviation Chest: No chest wall pain w good excursion CV:  Pulses intact.  Regular rhythm MS: Normal AROM mjr joints.  No obvious deformity Abdomen: Soft.  Obese.  Moderately distended.  Right/centrally tender.  No evidence of peritonitis.  No incarcerated hernias. Ext:  SCDs BLE.  No  mjr edema.  No cyanosis Skin: No petechiae / purpura   Problem List:   Principal Problem:   SBO (small bowel obstruction) Active Problems:   Chronic pain syndrome   Anxiety state, unspecified   DEPRESSION, CHRONIC   Chronic constipation   PTSD (post-traumatic stress disorder)  SURGERY 07/29/2013 POST-OPERATIVE DIAGNOSIS: recurrent abdominal pain,extensive adhesions   PROCEDURE: Procedure(s):  diagnostic laporoscopy  LYSIS OF ADHESIONS  serosal repair  SURGEON: Surgeon(s):  Ardeth Sportsman, MD  Kandis Cocking, MD - Asst      Assessment  [name]  42 y.o. female       Ileus vs. Partial small bowel obstruction in patient with chronic pain on chronic narcotics with significant lesions, 11 days status post laparoscopic lysis of adhesions  Plan:  -Continue IV fluid hydration.   -D5 for hypoglycemia.  Hold metformin -NGT decompression -Left colon decompressed.  Doubt enema will help.  Continue suppositories to help stimulate return of bowel function.  Try to keep narcotics minimal without severe pain.  Would not be able to tolerate them anyway.  Just IV morphine sparingly if possible.  I do agree she needs to get back to her chronic pain specialist at [facility] at some point, but this is not a great time to radically change her regimen if possible.  Hold off on palliative care consult at this time as may be confusing.  Poor time to try and operate on her.  No tachycardia, fever, leukocytosis, definite peritonitis.  Therefore, hold off.  Suspect is related to inflammation and should  resolve over time.  -VTE prophylaxis- SCDs, etc  -mobilize as tolerated to help recovery.  Try to get her up more  -?control depression.  Difficult with depression.  I updated the patient's status.  Recommendations were made.  Questions were answered.  She expressed understanding & appreciation.     Ardeth Sportsman, M.D., F.A.C.S. Gastrointestinal and Minimally Invasive  Surgery [facility] [address], [address] [place], [place] [address] [phone] Main / Paging   08/10/2013   Results:   Labs: Results for orders placed during the hospital encounter of 08/08/13 (from the past 48 hour(s))  CBC WITH DIFFERENTIAL     Status: Abnormal   Collection Time    08/08/13  4:00 PM      Result Value Range   WBC 6.5  4.0 - 10.5 K/uL   RBC 4.30  3.87 - 5.11 MIL/uL   Hemoglobin 13.4  12.0 - 15.0 g/dL   HCT 82.9  56.2 - 13.0 %   MCV 91.9  78.0 - 100.0 fL   MCH 31.2  26.0 - 34.0 pg   MCHC 33.9  30.0 - 36.0 g/dL   RDW 86.5  78.4 - 69.6 %   Platelets 367  150 - 400 K/uL   Neutrophils Relative % 51  43 - 77 %   Neutro Abs 3.3  1.7 - 7.7 K/uL   Lymphocytes Relative 33  12 - 46 %   Lymphs Abs 2.2  0.7 - 4.0 K/uL   Monocytes Relative 13 (*) 3 - 12 %   Monocytes Absolute 0.8  0.1 - 1.0 K/uL   Eosinophils Relative 2  0 - 5 %   Eosinophils Absolute 0.2  0.0 - 0.7 K/uL   Basophils Relative 1  0 - 1 %   Basophils Absolute 0.0  0.0 - 0.1 K/uL  COMPREHENSIVE METABOLIC PANEL     Status: Abnormal   Collection Time    08/08/13  4:00 PM      Result Value Range   Sodium 140  135 - 145 mEq/L   Potassium 3.7  3.5 - 5.1 mEq/L   Chloride 101  96 - 112 mEq/L   CO2 26  19 - 32 mEq/L   Glucose, Bld 113 (*) 70 - 99 mg/dL   BUN 16  6 - 23 mg/dL   Creatinine, Ser 2.95  0.50 - 1.10 mg/dL   Calcium 28.4  8.4 - 13.2 mg/dL   Total Protein 7.9  6.0 - 8.3 g/dL   Albumin 4.2  3.5 - 5.2 g/dL   AST 14  0 - 37 U/L   ALT 10  0 - 35 U/L   Alkaline Phosphatase 63  39 - 117 U/L   Total Bilirubin 0.3  0.3 - 1.2 mg/dL   GFR calc non Af Amer 65 (*) >90 mL/min   GFR calc Af Amer 75 (*) >90 mL/min   Comment: (NOTE)     The eGFR has been calculated using the CKD EPI equation.     This calculation has not been validated in all clinical situations.     eGFR's persistently <90 mL/min signify possible Chronic Kidney     Disease.  LIPASE, BLOOD     Status: None    Collection Time    08/08/13  4:00 PM      Result Value Range   Lipase 50  11 - 59 U/L  D-DIMER, QUANTITATIVE     Status: Abnormal   Collection Time  08/08/13  4:48 PM      Result Value Range   D-Dimer, Quant 2.73 (*) 0.00 - 0.48 ug/mL-FEU   Comment:            AT THE INHOUSE ESTABLISHED CUTOFF     VALUE OF 0.48 ug/mL FEU,     THIS ASSAY HAS BEEN DOCUMENTED     IN THE LITERATURE TO HAVE     A SENSITIVITY AND NEGATIVE     PREDICTIVE VALUE OF AT LEAST     98 TO 99%.  THE TEST RESULT     SHOULD BE CORRELATED WITH     AN ASSESSMENT OF THE CLINICAL     PROBABILITY OF DVT / VTE.  URINALYSIS, ROUTINE W REFLEX MICROSCOPIC     Status: Abnormal   Collection Time    08/08/13  5:13 PM      Result Value Range   Color, Urine AMBER (*) YELLOW   Comment: BIOCHEMICALS MAY BE AFFECTED BY COLOR   APPearance CLOUDY (*) CLEAR   Specific Gravity, Urine 1.035 (*) 1.005 - 1.030   pH 5.5  5.0 - 8.0   Glucose, UA NEGATIVE  NEGATIVE mg/dL   Hgb urine dipstick NEGATIVE  NEGATIVE   Bilirubin Urine SMALL (*) NEGATIVE   Ketones, ur 40 (*) NEGATIVE mg/dL   Protein, ur NEGATIVE  NEGATIVE mg/dL   Urobilinogen, UA 0.2  0.0 - 1.0 mg/dL   Nitrite NEGATIVE  NEGATIVE   Leukocytes, UA NEGATIVE  NEGATIVE   Comment: MICROSCOPIC NOT DONE ON URINES WITH NEGATIVE PROTEIN, BLOOD, LEUKOCYTES, NITRITE, OR GLUCOSE <1000 mg/dL.  POCT I-STAT TROPONIN I     Status: None   Collection Time    08/08/13  5:33 PM      Result Value Range   Troponin i, poc 0.00  0.00 - 0.08 ng/mL   Comment 3            Comment: Due to the release kinetics of cTnI,     a negative result within the first hours     of the onset of symptoms does not rule out     myocardial infarction with certainty.     If myocardial infarction is still suspected,     repeat the test at appropriate intervals.  CG4 I-STAT (LACTIC ACID)     Status: None   Collection Time    08/08/13  5:35 PM      Result Value Range   Lactic Acid, Venous 1.40  0.5 - 2.2  mmol/L  PREGNANCY, URINE     Status: None   Collection Time    08/08/13  6:24 PM      Result Value Range   Preg Test, Ur NEGATIVE  NEGATIVE   Comment:            THE SENSITIVITY OF THIS     METHODOLOGY IS >20 mIU/mL.  GLUCOSE, CAPILLARY     Status: Abnormal   Collection Time    08/08/13 10:35 PM      Result Value Range   Glucose-Capillary 108 (*) 70 - 99 mg/dL  GLUCOSE, CAPILLARY     Status: None   Collection Time    08/09/13  5:05 AM      Result Value Range   Glucose-Capillary 78  70 - 99 mg/dL  BASIC METABOLIC PANEL     Status: Abnormal   Collection Time    08/09/13  5:10 AM      Result Value Range   Sodium 138  135 - 145 mEq/L   Potassium 3.5  3.5 - 5.1 mEq/L   Chloride 107  96 - 112 mEq/L   CO2 23  19 - 32 mEq/L   Glucose, Bld 89  70 - 99 mg/dL   BUN 13  6 - 23 mg/dL   Creatinine, Ser 1.61  0.50 - 1.10 mg/dL   Calcium 8.4  8.4 - 09.6 mg/dL   GFR calc non Af Amer 76 (*) >90 mL/min   GFR calc Af Amer 88 (*) >90 mL/min   Comment: (NOTE)     The eGFR has been calculated using the CKD EPI equation.     This calculation has not been validated in all clinical situations.     eGFR's persistently <90 mL/min signify possible Chronic Kidney     Disease.  CBC     Status: Abnormal   Collection Time    08/09/13  5:10 AM      Result Value Range   WBC 5.1  4.0 - 10.5 K/uL   RBC 3.50 (*) 3.87 - 5.11 MIL/uL   Hemoglobin 10.8 (*) 12.0 - 15.0 g/dL   Comment: DELTA CHECK NOTED     REPEATED TO VERIFY   HCT 32.5 (*) 36.0 - 46.0 %   MCV 92.9  78.0 - 100.0 fL   MCH 30.9  26.0 - 34.0 pg   MCHC 33.2  30.0 - 36.0 g/dL   RDW 04.5  40.9 - 81.1 %   Platelets 288  150 - 400 K/uL  GLUCOSE, CAPILLARY     Status: Abnormal   Collection Time    08/09/13  8:15 AM      Result Value Range   Glucose-Capillary 109 (*) 70 - 99 mg/dL  GLUCOSE, CAPILLARY     Status: None   Collection Time    08/09/13 12:01 PM      Result Value Range   Glucose-Capillary 89  70 - 99 mg/dL   Comment 1 Documented  in Chart     Comment 2 Notify RN    GLUCOSE, CAPILLARY     Status: Abnormal   Collection Time    08/09/13  4:05 PM      Result Value Range   Glucose-Capillary 69 (*) 70 - 99 mg/dL  GLUCOSE, CAPILLARY     Status: Abnormal   Collection Time    08/09/13  4:41 PM      Result Value Range   Glucose-Capillary 101 (*) 70 - 99 mg/dL  GLUCOSE, CAPILLARY     Status: None   Collection Time    08/09/13  8:01 PM      Result Value Range   Glucose-Capillary 94  70 - 99 mg/dL  GLUCOSE, CAPILLARY     Status: None   Collection Time    08/09/13 11:37 PM      Result Value Range   Glucose-Capillary 92  70 - 99 mg/dL  GLUCOSE, CAPILLARY     Status: None   Collection Time    08/10/13  3:31 AM      Result Value Range   Glucose-Capillary 93  70 - 99 mg/dL  CBC     Status: Abnormal   Collection Time    08/10/13  4:55 AM      Result Value Range   WBC 5.0  4.0 - 10.5 K/uL   RBC 3.24 (*) 3.87 - 5.11 MIL/uL   Hemoglobin 10.1 (*) 12.0 - 15.0 g/dL   HCT 91.4 (*) 78.2 - 95.6 %  MCV 93.2  78.0 - 100.0 fL   MCH 31.2  26.0 - 34.0 pg   MCHC 33.4  30.0 - 36.0 g/dL   RDW 78.2  95.6 - 21.3 %   Platelets 294  150 - 400 K/uL  BASIC METABOLIC PANEL     Status: Abnormal   Collection Time    08/10/13  4:55 AM      Result Value Range   Sodium 136  135 - 145 mEq/L   Potassium 3.5  3.5 - 5.1 mEq/L   Chloride 105  96 - 112 mEq/L   CO2 23  19 - 32 mEq/L   Glucose, Bld 107 (*) 70 - 99 mg/dL   BUN 7  6 - 23 mg/dL   Creatinine, Ser 0.86  0.50 - 1.10 mg/dL   Calcium 8.4  8.4 - 57.8 mg/dL   GFR calc non Af Amer 85 (*) >90 mL/min   GFR calc Af Amer >90  >90 mL/min   Comment: (NOTE)     The eGFR has been calculated using the CKD EPI equation.     This calculation has not been validated in all clinical situations.     eGFR's persistently <90 mL/min signify possible Chronic Kidney     Disease.    Imaging / Studies: Ct Angio Chest Pe W/cm &/or Wo Cm  08/08/2013   CLINICAL DATA:  Patient with history of  laparoscopic surgery 9/26. Abdominal pain and bloating. Pulmonary embolism.  EXAM: CT ANGIOGRAPHY CHEST WITH CONTRAST  TECHNIQUE: Multidetector CT imaging of the chest was performed using the standard protocol during bolus administration of intravenous contrast. Multiplanar CT image reconstructions including MIPs were obtained to evaluate the vascular anatomy.  CONTRAST:  OMNIPAQUE IOHEXOL 350 MG/ML SOLN  COMPARISON:  10/12/2009.  FINDINGS: There is some bolus dispersion on the examination. There is no central pulmonary embolus. The subsegmental vessels are not evaluated due to bolus dispersion. No evidence of right heart strain. No adenopathy. No effusion. Spinal stimulator leads terminate in the mid thoracic spine. Central airways and lungs are clear. No aggressive osseous lesions are present. There is reflux of contrast injected in the left upper extremity into a Sue pre matter costal vein and inferiorly in a left paravertebral vein.  Review of the MIP images confirms the above findings.  IMPRESSION: No central pulmonary embolus identified. Study mildly degraded by bolus dispersion.   Electronically Signed   By: Andreas Newport M.D.   On: 08/08/2013 18:51   Ct Abdomen Pelvis W Contrast  08/08/2013   CLINICAL DATA:  History of laparoscopic surgery 9/26. Recent lysis of adhesions. Abdominal pain.  EXAM: CT ABDOMEN AND PELVIS WITH CONTRAST  TECHNIQUE: Multidetector CT imaging of the abdomen and pelvis was performed using the standard protocol following bolus administration of intravenous contrast.  CONTRAST:  OMNIPAQUE IOHEXOL 350 MG/ML SOLN  COMPARISON:  04/27/2013.  FINDINGS: Liver:  Normal.  Spleen:  Normal.  Gallbladder:  Normal.  Common bile duct:  Normal.  Pancreas:  Normal.  Adrenal glands:  Normal bilaterally.  Kidneys: Normal enhancement and delayed excretion of contrast. Both ureters appear within normal limits.  Stomach:  Normal.  No inflammatory changes.  Small bowel: The duodenum is  within normal limits. There is no dilation proximal to the ligament of Treitz. Shortly after the ligament of Treitz, small bowel all fecalization is present in the proximal jejunum and small bowel becomes dilated with air-fluid levels. Small mesenteric lymph nodes are present which are probably reactive. Small  bowel dilation measures up to 47 mm. There is a transition point in the central abdomen (image 125 series 4) with relative decompression of distal small bowel. Small to moderate amount of free fluid in the anatomic pelvis.  Colon: Moderate amount of stool is present within the ascending colon. The transverse colon and descending colon are decompressed.  Pelvic Genitourinary:  Uterus appears within normal limits.  Bones: No aggressive osseous lesions. L4-L5 posterior lumbar interbody fusion. Posterior lateral fusion mass extends from L4 through S1 and the fusion appears solid. Spinal stimulator leads terminate in the mid thoracic spine.  Vasculature: Normal.  Body Wall: Midline abdominal scar.  Significantly, there is no intra-abdominal fluid collection suspicious for abscess. Small ileocolic lymph nodes incidentally noted.  IMPRESSION: 1. Recurrent small bowel obstruction. Transition point in the central abdomen involving either the distal jejunum or proximal ileum. This may be early high grade or partial based on stool within the colon. 2. Small to moderate matter free fluid in the anatomic pelvis, likely reactive. No intra-abdominal abscess.   Electronically Signed   By: Andreas Newport M.D.   On: 08/08/2013 19:03   Dg Abd 2 Views  08/09/2013   CLINICAL DATA:  Small bowel obstruction. Recent abdominal surgery.  EXAM: ABDOMEN - 2 VIEW  COMPARISON:  Acute abdominal series and CT abdomen 08/08/2013.  FINDINGS: The lung bases are clear.  A spinal stimulator is again noted.  Fluid levels are present within dilated loops of small bowel measuring up to 5.5 cm. Although there is some gas within the ascending  colon, this is most concerning for a small bowel obstruction. No free air is evident. Postsurgical changes are noted in the lower lumbar spine.  IMPRESSION: 1. Persistent small bowel obstruction. 2. No free air.   Electronically Signed   By: Gennette Pac M.D.   On: 08/09/2013 07:46   Dg Abd Acute W/chest  08/08/2013   CLINICAL DATA:  Abdominal pain for 1 week. Recent surgery 1 week ago.  EXAM: ACUTE ABDOMEN SERIES (ABDOMEN 2 VIEW & CHEST 1 VIEW)  COMPARISON:  07/19/2013 chest radiograph. Abdominal CT 04/27/2013.  FINDINGS: ACDF in the cervical spine and spinal stimulator in the thoracic spine. There is no active cardiopulmonary disease.  No free air underneath the hemidiaphragms. Dilated loops of small bowel are present with multiple air-fluid levels. There is minimal colonic dilation/distension. Paucity of distal bowel gas. PLIF at L4-L5.  IMPRESSION: 1. Dilated loops of small bowel with multiple air-fluid levels. Maximal small bowel diameter is 4 cm. In the postoperative setting, ileus is more common however the lack of colonic distension favors small bowel obstruction over ileus. 2. Postoperative changes of the spine.   Electronically Signed   By: Andreas Newport M.D.   On: 08/08/2013 17:05    Medications / Allergies: per chart  Antibiotics: Anti-infectives   None

## 2013-08-10 NOTE — ED Provider Notes (Signed)
Medical screening examination/treatment/procedure(s) were performed by non-physician practitioner and as supervising physician I was immediately available for consultation/collaboration.   Laray Anger, DO 08/10/13 1205

## 2013-08-11 ENCOUNTER — Encounter (INDEPENDENT_AMBULATORY_CARE_PROVIDER_SITE_OTHER): Payer: Medicare Other | Admitting: Surgery

## 2013-08-11 ENCOUNTER — Inpatient Hospital Stay (HOSPITAL_COMMUNITY): Payer: Managed Care, Other (non HMO)

## 2013-08-11 LAB — BASIC METABOLIC PANEL
BUN: <3 mg/dL — ABNORMAL LOW (ref 6–23)
Calcium: 8.4 mg/dL (ref 8.4–10.5)
Creatinine, Ser: 0.85 mg/dL (ref 0.50–1.10)
GFR calc non Af Amer: 82 mL/min — ABNORMAL LOW (ref >90)
Glucose, Bld: 117 mg/dL — ABNORMAL HIGH (ref 70–99)
Sodium: 140 mEq/L (ref 135–145)

## 2013-08-11 LAB — GLUCOSE, CAPILLARY
Glucose-Capillary: 107 mg/dL — ABNORMAL HIGH (ref 70–99)
Glucose-Capillary: 115 mg/dL — ABNORMAL HIGH (ref 70–99)
Glucose-Capillary: 99 mg/dL (ref 70–99)

## 2013-08-11 LAB — CBC
HCT: 31.5 % — ABNORMAL LOW (ref 36.0–46.0)
Hemoglobin: 10.6 g/dL — ABNORMAL LOW (ref 12.0–15.0)
MCH: 30.9 pg (ref 26.0–34.0)
MCHC: 33.7 g/dL (ref 30.0–36.0)
MCV: 91.8 fL (ref 78.0–100.0)
RDW: 12.8 % (ref 11.5–15.5)

## 2013-08-11 MED ORDER — MAGIC MOUTHWASH
15.0000 mL | Freq: Four times a day (QID) | ORAL | Status: DC
  Administered 2013-08-11 – 2013-08-19 (×9): 15 mL via ORAL
  Filled 2013-08-11 (×64): qty 15

## 2013-08-11 MED ORDER — METHOCARBAMOL 100 MG/ML IJ SOLN
500.0000 mg | Freq: Three times a day (TID) | INTRAVENOUS | Status: AC
  Administered 2013-08-11 – 2013-08-13 (×9): 500 mg via INTRAVENOUS
  Filled 2013-08-11 (×12): qty 5

## 2013-08-11 MED ORDER — METOCLOPRAMIDE HCL 5 MG/ML IJ SOLN
10.0000 mg | Freq: Four times a day (QID) | INTRAMUSCULAR | Status: DC
  Administered 2013-08-11 – 2013-08-16 (×21): 10 mg via INTRAVENOUS
  Filled 2013-08-11 (×29): qty 2

## 2013-08-11 MED ORDER — ACETAMINOPHEN 10 MG/ML IV SOLN
1000.0000 mg | Freq: Four times a day (QID) | INTRAVENOUS | Status: DC
  Administered 2013-08-11 – 2013-08-12 (×4): 1000 mg via INTRAVENOUS
  Filled 2013-08-11 (×4): qty 100

## 2013-08-11 NOTE — Progress Notes (Addendum)
Palliative Medicine Tam consult for symptom management recommendations received; PMT provider attempted to contact Dr Michaell Cowing however he was off this afternoon; per chart review current PRN orders in place for symptoms until  PMT provider can follow up and speak with Dr Michaell Cowing in the am  Valente David, RN 08/11/2013, 4:09 PM Palliative Medicine Team RN Liaison [phone]

## 2013-08-11 NOTE — Progress Notes (Addendum)
Jane English [id number] [birth year]  CARE TEAM:  PCP: Syliva Overman, MD  Outpatient Care Team: Patient Care Team: Kerri Perches, MD as PCP - General Ardeth Sportsman, MD as Consulting Physician (General Surgery) Karn Cassis, MD as Consulting Physician (Neurosurgery) Beverley Fiedler, MD as Consulting Physician (Gastroenterology)  Inpatient Treatment Team: Treatment Team: Attending Provider: Ardeth Sportsman, MD; Registered Nurse: Dina Rich, RN; Technician: Vella Raring, NT; Registered Nurse: Jonathon Bellows, RN; Registered Nurse: Tristan Schroeder, RN   Subjective:  Patient depressed,  Gordy Levan hallways much more. Sitting at bedside looking at phone. Claims they giving a lot of pills by mouth and cannot tolerate it. (?!) Claims sore throat.  Objective:  Vital signs:  Filed Vitals:   08/10/13 1400 08/10/13 2034 08/10/13 2235 08/11/13 0546  BP: 121/80  131/83 128/79  Pulse: 91  96 84  Temp: 97.9 F (36.6 C)  99.4 F (37.4 C) 98.2 F (36.8 C)  TempSrc: Oral  Oral Oral  Resp: 20  18 18   Height:      Weight:      SpO2: 98% 97% 99% 99%    Last BM Date: 08/03/13  Intake/Output   Yesterday:  10/07 0701 - 10/08 0700 In: 2628.3 [I.V.:2628.3] Out: 4700 [Urine:4400; Emesis/NG output:300] This shift:     Bowel function:  Flatus: n  BM: n  Drain: n/a  Physical Exam:  General: Pt awake/alert/oriented x4 in no acute distress Eyes: PERRL, normal EOM.  Sclera clear.  No icterus Neuro: CN II-XII intact w/o focal sensory/motor deficits. Lymph: No head/neck/groin lymphadenopathy Psych:  No delerium/psychosis/paranoia.  Withdrawn.  Avoids eye contact.  Obviously depressed HENT: Normocephalic, Mucus membranes moist.  No thrush.  NGT in place Neck: Supple, No tracheal deviation Chest: No chest wall pain w good excursion CV:  Pulses intact.  Regular rhythm MS: Normal AROM mjr joints.  No obvious deformity Abdomen: Softer.  Obese.  Mildly  distended.  Right/centrally tender.  No severe pain with cough or percussion or bed shake.  Therefore no strong evidence of peritonitis.  No incarcerated hernias. Ext:  SCDs BLE.  No mjr edema.  No cyanosis Skin: No petechiae / purpura   Problem List:   Principal Problem:   SBO (small bowel obstruction) Active Problems:   Chronic pain syndrome   Anxiety state, unspecified   DEPRESSION, CHRONIC   Chronic constipation   PTSD (post-traumatic stress disorder)  SURGERY 07/29/2013 POST-OPERATIVE DIAGNOSIS: recurrent abdominal pain,extensive adhesions   PROCEDURE: Procedure(s):  diagnostic laporoscopy  LYSIS OF ADHESIONS  serosal repair  SURGEON: Surgeon(s):  Ardeth Sportsman, MD  Kandis Cocking, MD - Asst      Assessment  Jane English  42 y.o. female       Ileus vs. Partial small bowel obstruction in patient with chronic pain on chronic narcotics with significant adhesiolysis  Plan:  -Continue IV fluid hydration.   -D5 for hypoglycemia.  Hold metformin -NGT decompression -Round-the-clock nausea controlled with ondansetron.  Adding metoclopramide as well. Followup x-rays today to see what the status is. -palliate sore throat -Left colon decompressed.  Doubt enema will help.  Continue suppositories to help stimulate return of bowel function.  Try to keep narcotics minimal without severe pain.  Using robaxin RTC.  Add IV toradol as well.  Would not be able to tolerate them anyway.  Just IV morphine sparingly if possible.  I do agree she needs to get back to her chronic pain specialist at [facility] at some point, but this is not a great time to radically change her regimen if possible.  Hold off on palliative care consult at this time as may be confusing.  Poor time to try and operate on her.  I would wait very long time if possible.  No tachycardia, fever, leukocytosis, definite peritonitis.  Therefore, hold off.  Suspect is related to inflammation and should resolve over  time.  -VTE prophylaxis- SCDs, etc  -mobilize as tolerated to help recovery.  The more she can walk, the better  -?control depression.  Difficult with ileus/SBO.  I updated the patient's status.  Recommendations were made.  Questions were answered.  She expressed understanding & appreciation.     Ardeth Sportsman, M.D., F.A.C.S. Gastrointestinal and Minimally Invasive Surgery [facility] [address], [address] [place], [place] [address] [phone] Main / Paging   08/11/2013   Results:   Labs: Results for orders placed during the hospital encounter of 08/08/13 (from the past 48 hour(s))  GLUCOSE, CAPILLARY     Status: Abnormal   Collection Time    08/09/13  8:15 AM      Result Value Range   Glucose-Capillary 109 (*) 70 - 99 mg/dL  GLUCOSE, CAPILLARY     Status: None   Collection Time    08/09/13 12:01 PM      Result Value Range   Glucose-Capillary 89  70 - 99 mg/dL   Comment 1 Documented in Chart     Comment 2 Notify RN    GLUCOSE, CAPILLARY     Status: Abnormal   Collection Time    08/09/13  4:05 PM      Result Value Range   Glucose-Capillary 69 (*) 70 - 99 mg/dL  GLUCOSE, CAPILLARY     Status: Abnormal   Collection Time    08/09/13  4:41 PM      Result Value Range   Glucose-Capillary 101 (*) 70 - 99 mg/dL  GLUCOSE, CAPILLARY     Status: None   Collection Time    08/09/13  8:01 PM      Result Value Range   Glucose-Capillary 94  70 - 99 mg/dL  GLUCOSE, CAPILLARY     Status: None   Collection Time    08/09/13 11:37 PM      Result Value Range   Glucose-Capillary 92  70 - 99 mg/dL  GLUCOSE, CAPILLARY     Status: None   Collection Time    08/10/13  3:31 AM      Result Value Range   Glucose-Capillary 93  70 - 99 mg/dL  CBC     Status: Abnormal   Collection Time    08/10/13  4:55 AM      Result Value Range   WBC 5.0  4.0 - 10.5 K/uL   RBC 3.24 (*) 3.87 - 5.11 MIL/uL   Hemoglobin 10.1 (*) 12.0 - 15.0 g/dL   HCT 82.9 (*) 56.2 -  46.0 %   MCV 93.2  78.0 - 100.0 fL   MCH 31.2  26.0 - 34.0 pg   MCHC 33.4  30.0 - 36.0 g/dL   RDW 13.0  86.5 - 78.4 %   Platelets 294  150 - 400 K/uL  BASIC METABOLIC PANEL     Status: Abnormal   Collection Time    08/10/13  4:55 AM      Result Value Range   Sodium 136  135 - 145 mEq/L   Potassium  3.5  3.5 - 5.1 mEq/L   Chloride 105  96 - 112 mEq/L   CO2 23  19 - 32 mEq/L   Glucose, Bld 107 (*) 70 - 99 mg/dL   BUN 7  6 - 23 mg/dL   Creatinine, Ser 1.61  0.50 - 1.10 mg/dL   Calcium 8.4  8.4 - 09.6 mg/dL   GFR calc non Af Amer 85 (*) >90 mL/min   GFR calc Af Amer >90  >90 mL/min   Comment: (NOTE)     The eGFR has been calculated using the CKD EPI equation.     This calculation has not been validated in all clinical situations.     eGFR's persistently <90 mL/min signify possible Chronic Kidney     Disease.  GLUCOSE, CAPILLARY     Status: Abnormal   Collection Time    08/10/13  7:54 AM      Result Value Range   Glucose-Capillary 109 (*) 70 - 99 mg/dL  GLUCOSE, CAPILLARY     Status: Abnormal   Collection Time    08/10/13 12:27 PM      Result Value Range   Glucose-Capillary 111 (*) 70 - 99 mg/dL  GLUCOSE, CAPILLARY     Status: Abnormal   Collection Time    08/10/13  4:26 PM      Result Value Range   Glucose-Capillary 114 (*) 70 - 99 mg/dL   Comment 1 Notify RN     Comment 2 Documented in Chart    GLUCOSE, CAPILLARY     Status: Abnormal   Collection Time    08/10/13  8:03 PM      Result Value Range   Glucose-Capillary 103 (*) 70 - 99 mg/dL  GLUCOSE, CAPILLARY     Status: Abnormal   Collection Time    08/10/13 11:40 PM      Result Value Range   Glucose-Capillary 110 (*) 70 - 99 mg/dL  GLUCOSE, CAPILLARY     Status: Abnormal   Collection Time    08/11/13  4:07 AM      Result Value Range   Glucose-Capillary 122 (*) 70 - 99 mg/dL  CBC     Status: Abnormal   Collection Time    08/11/13  5:07 AM      Result Value Range   WBC 7.1  4.0 - 10.5 K/uL   RBC 3.43 (*) 3.87 -  5.11 MIL/uL   Hemoglobin 10.6 (*) 12.0 - 15.0 g/dL   HCT 04.5 (*) 40.9 - 81.1 %   MCV 91.8  78.0 - 100.0 fL   MCH 30.9  26.0 - 34.0 pg   MCHC 33.7  30.0 - 36.0 g/dL   RDW 91.4  78.2 - 95.6 %   Platelets 314  150 - 400 K/uL  BASIC METABOLIC PANEL     Status: Abnormal   Collection Time    08/11/13  5:07 AM      Result Value Range   Sodium 140  135 - 145 mEq/L   Potassium 3.4 (*) 3.5 - 5.1 mEq/L   Chloride 106  96 - 112 mEq/L   CO2 25  19 - 32 mEq/L   Glucose, Bld 117 (*) 70 - 99 mg/dL   BUN <3 (*) 6 - 23 mg/dL   Comment: REPEATED TO VERIFY   Creatinine, Ser 0.85  0.50 - 1.10 mg/dL   Calcium 8.4  8.4 - 21.3 mg/dL   GFR calc non Af Amer 82 (*) >90 mL/min  GFR calc Af Amer >90  >90 mL/min   Comment: (NOTE)     The eGFR has been calculated using the CKD EPI equation.     This calculation has not been validated in all clinical situations.     eGFR's persistently <90 mL/min signify possible Chronic Kidney     Disease.    Imaging / Studies: Dg Abd 2 Views  08/09/2013   CLINICAL DATA:  Small bowel obstruction. Recent abdominal surgery.  EXAM: ABDOMEN - 2 VIEW  COMPARISON:  Acute abdominal series and CT abdomen 08/08/2013.  FINDINGS: The lung bases are clear.  A spinal stimulator is again noted.  Fluid levels are present within dilated loops of small bowel measuring up to 5.5 cm. Although there is some gas within the ascending colon, this is most concerning for a small bowel obstruction. No free air is evident. Postsurgical changes are noted in the lower lumbar spine.  IMPRESSION: 1. Persistent small bowel obstruction. 2. No free air.   Electronically Signed   By: Gennette Pac M.D.   On: 08/09/2013 07:46    Medications / Allergies: per chart  Antibiotics: Anti-infectives   None

## 2013-08-12 LAB — GLUCOSE, CAPILLARY
Glucose-Capillary: 109 mg/dL — ABNORMAL HIGH (ref 70–99)
Glucose-Capillary: 107 mg/dL — ABNORMAL HIGH (ref 70–99)
Glucose-Capillary: 89 mg/dL (ref 70–99)
Glucose-Capillary: 110 mg/dL — ABNORMAL HIGH (ref 70–99)
Glucose-Capillary: 113 mg/dL — ABNORMAL HIGH (ref 70–99)

## 2013-08-12 LAB — BASIC METABOLIC PANEL
BUN: 6 mg/dL (ref 6–23)
CO2: 23 mEq/L (ref 19–32)
Calcium: 9 mg/dL (ref 8.4–10.5)
Creatinine, Ser: 0.89 mg/dL (ref 0.50–1.10)
GFR calc non Af Amer: 78 mL/min — ABNORMAL LOW (ref >90)
Glucose, Bld: 112 mg/dL — ABNORMAL HIGH (ref 70–99)
Potassium: 3.6 mEq/L (ref 3.5–5.1)
Sodium: 137 mEq/L (ref 135–145)

## 2013-08-12 LAB — CBC
HCT: 33.8 % — ABNORMAL LOW (ref 36.0–46.0)
Hemoglobin: 11.5 g/dL — ABNORMAL LOW (ref 12.0–15.0)
MCH: 31.2 pg (ref 26.0–34.0)
MCHC: 34 g/dL (ref 30.0–36.0)
MCV: 91.6 fL (ref 78.0–100.0)
Platelets: 321 10*3/uL (ref 150–400)
RBC: 3.69 MIL/uL — ABNORMAL LOW (ref 3.87–5.11)
RDW: 12.8 % (ref 11.5–15.5)
WBC: 7.8 10*3/uL (ref 4.0–10.5)

## 2013-08-12 MED ORDER — TRACE MINERALS CR-CU-F-FE-I-MN-MO-SE-ZN IV SOLN
INTRAVENOUS | Status: AC
  Administered 2013-08-12: 18:00:00 via INTRAVENOUS
  Filled 2013-08-12: qty 1000

## 2013-08-12 MED ORDER — SODIUM CHLORIDE 0.9 % IJ SOLN
10.0000 mL | INTRAMUSCULAR | Status: DC | PRN
  Administered 2013-08-13 – 2013-08-31 (×13): 10 mL

## 2013-08-12 MED ORDER — PANTOPRAZOLE SODIUM 40 MG IV SOLR
40.0000 mg | Freq: Two times a day (BID) | INTRAVENOUS | Status: DC
  Administered 2013-08-12 – 2013-08-14 (×6): 40 mg via INTRAVENOUS
  Filled 2013-08-12 (×8): qty 40

## 2013-08-12 MED ORDER — LORAZEPAM 2 MG/ML IJ SOLN
2.0000 mg | Freq: Every day | INTRAMUSCULAR | Status: DC
  Administered 2013-08-12 – 2013-08-14 (×3): 2 mg via INTRAVENOUS
  Filled 2013-08-12 (×3): qty 1

## 2013-08-12 MED ORDER — KCL IN DEXTROSE-NACL 20-5-0.45 MEQ/L-%-% IV SOLN
INTRAVENOUS | Status: AC
  Administered 2013-08-13: 08:00:00 via INTRAVENOUS
  Filled 2013-08-12 (×2): qty 1000

## 2013-08-12 MED ORDER — INSULIN ASPART 100 UNIT/ML ~~LOC~~ SOLN: 0.0000 [IU] | SUBCUTANEOUS | Status: DC

## 2013-08-12 MED ORDER — FAT EMULSION 20 % IV EMUL
250.0000 mL | INTRAVENOUS | Status: AC
  Administered 2013-08-12: 250 mL via INTRAVENOUS
  Filled 2013-08-12: qty 250

## 2013-08-12 MED ORDER — KETOROLAC TROMETHAMINE 30 MG/ML IJ SOLN
30.0000 mg | Freq: Four times a day (QID) | INTRAMUSCULAR | Status: AC
  Administered 2013-08-12 – 2013-08-17 (×18): 30 mg via INTRAVENOUS
  Filled 2013-08-12 (×22): qty 1

## 2013-08-12 NOTE — Progress Notes (Signed)
  This NP attempted to contact Dr Michaell Cowing for palliative needs regarding Ms Tiburcio Pea. She is complex and I need to discuss  recommendations with him before engaging with the patient. Dr Michaell Cowing in OR, left message with his nurse and my contact cell number for call back. (C# [phone])  Spoke with bedside RN  Shanda Bumps   Left message concerning the same with Lorenda Ishihara [facility]  Lorinda Creed NP  Palliative Medicine Team Team Phone # [phone] Pager [phone]

## 2013-08-12 NOTE — Telephone Encounter (Signed)
Done on note next day

## 2013-08-12 NOTE — Progress Notes (Signed)
PARENTERAL NUTRITION CONSULT NOTE - INITIAL  Pharmacy Consult for TPN Indication: Bowel obstruction  Allergies  Allergen Reactions  . Neurontin [Gabapentin] Other (See Comments)    Other reaction(s): Mental Status Changes (intolerance) hallucinations Sleep walking  . Amoxicillin Hives  . Penicillins Hives  . Latex Rash    Patient Measurements: Height: 5\' 6"  (167.6 cm) Weight: 164 lb 14.5 oz (74.8 kg) IBW/kg (Calculated) : 59.3 Adjusted Body Weight: 64 kg   Vital Signs: Temp: 99.6 F (37.6 C) (10/09 0544) Temp src: Oral (10/09 0544) BP: 150/93 mmHg (10/09 0544) Pulse Rate: 100 (10/09 0544) Intake/Output from previous day: 10/08 0701 - 10/09 0700 In: 2610 [I.V.:1845; IV Piggyback:765] Out: 1500 [Urine:1150; Emesis/NG output:350] Intake/Output from this shift:    Labs:  Recent Labs  08/10/13 0455 08/11/13 0507 08/12/13 0450  WBC 5.0 7.1 7.8  HGB 10.1* 10.6* 11.5*  HCT 30.2* 31.5* 33.8*  PLT 294 314 321     Recent Labs  08/10/13 0455 08/11/13 0507 08/12/13 0450  NA 136 140 137  K 3.5 3.4* 3.6  CL 105 106 103  CO2 23 25 23   GLUCOSE 107* 117* 112*  BUN 7 <3* 6  CREATININE 0.83 0.85 0.89  CALCIUM 8.4 8.4 9.0   Estimated Creatinine Clearance: 83.4 ml/min (by C-G formula based on Cr of 0.89).    Recent Labs  08/11/13 2022 08/11/13 2335 08/12/13 0406  GLUCAP 99 115* 109*    Medical History: Past Medical History  Diagnosis Date  . Migraine headache   . Back pain   . Obesity   . Hypertension   . DVT (deep venous thrombosis) 2010  . Heart murmur     no cardiologist  . Asthma   . Bronchitis   . Depression   . Diabetes mellitus without complication   . Obsessive-compulsive disorder   . PTSD (post-traumatic stress disorder)   . Shortness of breath   . GERD (gastroesophageal reflux disease)   . Anemia   . 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 Asthma flare  04/09/2013  . Seasonal allergies 12/10/2012  . Chronic abdominal pain   . Chronic constipation     Medications:  Scheduled:  . bisacodyl  10 mg Rectal Daily  . budesonide-formoterol  2 puff Inhalation BID  . fluticasone  2 spray Each Nare Daily  . heparin  5,000 Units Subcutaneous Q8H  . ketorolac  30 mg Intravenous Q6H  . lip balm  1 application Topical BID  . LORazepam  2 mg Intravenous QHS  . magic mouthwash  15 mL Oral QID  . methocarbamol (ROBAXIN) IV  500 mg Intravenous Q8H  . metoCLOPramide (REGLAN) injection  10 mg Intravenous Q6H  . ondansetron  4 mg Intravenous Q6H  . pantoprazole (PROTONIX) IV  40 mg Intravenous Q12H   Infusions:  . dextrose 5 % and 0.45 % NaCl with KCl 20 mEq/L 100 mL/hr at 08/11/13 2150   PRN: acetaminophen, alum & mag hydroxide-simeth, diclofenac sodium, diphenhydrAMINE, ipratropium, lidocaine, LORazepam, menthol-cetylpyridinium, metoprolol, morphine injection, phenol  Insulin Requirements in the past 24 hours:  None  Current Nutrition:  NPO with NGT in place D-5-1/2 NS + KCl 77mEq/L at 100 mL/hr  Assessment: 42 y/o F s/p enterolysis of adhesions 07/29/13, presented to ED 08/08/13 with small bowel obstruction.   For PICC insertion and initiation of TNA today.  History of DM noted; was on metformin prior to admission. CBGs  in past 24 hours 99-115 without insulin.  Estimated Nutritional Goals:  Preliminary estimate: approximately 1700-1800 kCal/day, 90-100 grams protein/day (RD consult pending)  Plan:  1. For PICC insertion today as ordered by MD. 2. At 6pm, begin TNA:  Clinimix-E 5/15 at 40 mL/hr  Lipids 20% at 10 mL/hr  Standard multivitamins and trace elements 3. When TNA begins:  Add sliding scale Novolog (moderate scale) q4h  Reduce maintenance IVF rate to 50 mL/hr 4. Advance TNA rate as tolerated to goal of 75 mL/hr.  This will provide 1758 KCal/day, 90 grams protein/day 5. TNA labs tomorrow, then every Monday and Thursday.  Additional  labs as needed.  Reviewed with patient and husband.  Elie Goody, PharmD, BCPS Pager: [phone] 08/12/2013  7:58 AM

## 2013-08-12 NOTE — Progress Notes (Signed)
Jane English [id number] 09/14/1971  CARE TEAM:  PCP: Syliva Overman, MD  Outpatient Care Team: Patient Care Team: Kerri Perches, MD as PCP - General Ardeth Sportsman, MD as Consulting Physician (General Surgery) Karn Cassis, MD as Consulting Physician (Neurosurgery) Beverley Fiedler, MD as Consulting Physician (Gastroenterology)  Inpatient Treatment Team: Treatment Team: Attending Provider: Ardeth Sportsman, MD; Technician: Vella Raring, NT; Registered Nurse: Jonathon Bellows, RN; Registered Nurse: Tristan Schroeder, RN; Technician: Burnard Bunting, [place]; Consulting Physician: Palliative [facility]   Subjective:  Walked in hallways a lot. Rocking in chair Husband in room with many questions Does not like NGT.  Objective:  Vital signs:  Filed Vitals:   08/11/13 1341 08/11/13 2103 08/11/13 2150 08/12/13 0544  BP: 146/81  136/82 150/93  Pulse: 73 78 80 100  Temp: 99.6 F (37.6 C)  98.9 F (37.2 C) 99.6 F (37.6 C)  TempSrc: Oral  Oral Oral  Resp: 16 16 16 18   Height:      Weight:      SpO2: 100% 99% 99% 100%    Last BM Date: 08/03/13  Intake/Output   Yesterday:  10/08 0701 - 10/09 0700 In: 2610 [I.V.:1845; IV Piggyback:765] Out: 1500 [Urine:1150; Emesis/NG output:350] This shift:  Total I/O In: 1075 [I.V.:620; IV Piggyback:455] Out: 550 [Urine:250; Emesis/NG output:300]  Bowel function:  Flatus: n  BM: n  Drain: thinner bilious output  Physical Exam:  General: Pt awake/alert/oriented x4 in no acute distress Eyes: PERRL, normal EOM.  Sclera clear.  No icterus Neuro: CN II-XII intact w/o focal sensory/motor deficits. Lymph: No head/neck/groin lymphadenopathy Psych:  No delerium/psychosis/paranoia.  Withdrawn.  Avoids eye contact.  Still obviously depressed HENT: Normocephalic, Mucus membranes moist.  No thrush.  NGT in place Neck: Supple, No tracheal deviation Chest: No chest wall pain w good excursion CV:  Pulses intact.   Regular rhythm MS: Normal AROM mjr joints.  No obvious deformity Abdomen: Soft.  Obese.  Mild/mod distended.  Mild soreness throughout - improved.  No severe pain with cough or percussion or rocking in chair; therefore, no strong evidence of peritonitis.  No incarcerated hernias. Ext:  SCDs BLE.  No mjr edema.  No cyanosis Skin: No petechiae / purpura   Problem List:   Principal Problem:   SBO (small bowel obstruction) Active Problems:   Chronic pain syndrome   Anxiety state, unspecified   DEPRESSION, CHRONIC   Chronic constipation   PTSD (post-traumatic stress disorder)  SURGERY 07/29/2013 POST-OPERATIVE DIAGNOSIS: recurrent abdominal pain,extensive adhesions   PROCEDURE: Procedure(s):  diagnostic laporoscopy  LYSIS OF ADHESIONS  serosal repair  SURGEON: Surgeon(s):  Ardeth Sportsman, MD  Kandis Cocking, MD - Asst      Assessment  Jane English  42 y.o. female       Partial small bowel obstruction in patient with chronic pain on chronic narcotics s/p significant adhesiolysis  Plan:  -Continue IV fluid hydration.   -start TNA -NGT decompression -Round-the-clock nausea controlled with ondansetron & metoclopramide as well. -recheck x-rays in AM to see what the status is. -palliate sore throat -Left colon decompressed.  Doubt enema will help but may reconsider tomorrow.  Continue suppositories to help stimulate return of bowel function.  Try to keep narcotics minimal without severe pain.  Using robaxin RTC.  Add IV toradol as well.  Would not be able to tolerate them anyway.  Just IV morphine sparingly if possible.  I do agree she needs to get back  to her chronic pain specialist at [facility] at some point, but this is not a great time to radically change her regimen if possible.  Hold off on palliative care consult at this time as may be confusing.  Poor time to try and operate on her.  I would wait very long time if possible.  No tachycardia, fever, leukocytosis,  definite peritonitis.  Therefore, hold off.  Suspect is related to inflammation and should resolve over time.  -VTE prophylaxis- SCDs, etc  -mobilize as tolerated to help recovery.  The more she can walk, the better  -?control depression.  Difficult with ileus/SBO.  See what palliative care has to offer  Husband had many questions.  I get the strong sense they feel like nothing is being done.  Again went over the numerous things were doing with rehydration, nasogastric tube decompression, round-the-clock nausea control, primary lead to stimulate bowel function, encouraging activity, involving palliative care.  I noted that the NG tube output is more than, the abdomen is less distended and less painful, and gas is starting to show him the colon on x-rays yesterday.  Those are small but definite signs of improvement.  I cautioned against rushing into surgery at this moment.  It may come to that, but if she is stronger and better hydrated and her abdomen is less distended, it will be a safer operation.  They expressed understanding of my reasoning.  I sensed that they had little enthusiasm for it but it least were not belligerent.     Ardeth Sportsman, M.D., F.A.C.S. Gastrointestinal and Minimally Invasive Surgery [facility] [address], [address] [place], [place] [address] [phone] Main / Paging   08/12/2013   Results:   Labs: Results for orders placed during the hospital encounter of 08/08/13 (from the past 48 hour(s))  GLUCOSE, CAPILLARY     Status: Abnormal   Collection Time    08/10/13  7:54 AM      Result Value Range   Glucose-Capillary 109 (*) 70 - 99 mg/dL  GLUCOSE, CAPILLARY     Status: Abnormal   Collection Time    08/10/13 12:27 PM      Result Value Range   Glucose-Capillary 111 (*) 70 - 99 mg/dL  GLUCOSE, CAPILLARY     Status: Abnormal   Collection Time    08/10/13  4:26 PM      Result Value Range   Glucose-Capillary 114 (*) 70 - 99  mg/dL   Comment 1 Notify RN     Comment 2 Documented in Chart    GLUCOSE, CAPILLARY     Status: Abnormal   Collection Time    08/10/13  8:03 PM      Result Value Range   Glucose-Capillary 103 (*) 70 - 99 mg/dL  GLUCOSE, CAPILLARY     Status: Abnormal   Collection Time    08/10/13 11:40 PM      Result Value Range   Glucose-Capillary 110 (*) 70 - 99 mg/dL  GLUCOSE, CAPILLARY     Status: Abnormal   Collection Time    08/11/13  4:07 AM      Result Value Range   Glucose-Capillary 122 (*) 70 - 99 mg/dL  CBC     Status: Abnormal   Collection Time    08/11/13  5:07 AM      Result Value Range   WBC 7.1  4.0 - 10.5 K/uL   RBC 3.43 (*) 3.87 - 5.11 MIL/uL  Hemoglobin 10.6 (*) 12.0 - 15.0 g/dL   HCT 16.1 (*) 09.6 - 04.5 %   MCV 91.8  78.0 - 100.0 fL   MCH 30.9  26.0 - 34.0 pg   MCHC 33.7  30.0 - 36.0 g/dL   RDW 40.9  81.1 - 91.4 %   Platelets 314  150 - 400 K/uL  BASIC METABOLIC PANEL     Status: Abnormal   Collection Time    08/11/13  5:07 AM      Result Value Range   Sodium 140  135 - 145 mEq/L   Potassium 3.4 (*) 3.5 - 5.1 mEq/L   Chloride 106  96 - 112 mEq/L   CO2 25  19 - 32 mEq/L   Glucose, Bld 117 (*) 70 - 99 mg/dL   BUN <3 (*) 6 - 23 mg/dL   Comment: REPEATED TO VERIFY   Creatinine, Ser 0.85  0.50 - 1.10 mg/dL   Calcium 8.4  8.4 - 78.2 mg/dL   GFR calc non Af Amer 82 (*) >90 mL/min   GFR calc Af Amer >90  >90 mL/min   Comment: (NOTE)     The eGFR has been calculated using the CKD EPI equation.     This calculation has not been validated in all clinical situations.     eGFR's persistently <90 mL/min signify possible Chronic Kidney     Disease.  GLUCOSE, CAPILLARY     Status: Abnormal   Collection Time    08/11/13  8:34 AM      Result Value Range   Glucose-Capillary 113 (*) 70 - 99 mg/dL   Comment 1 Notify RN     Comment 2 Documented in Chart    GLUCOSE, CAPILLARY     Status: Abnormal   Collection Time    08/11/13  1:39 PM      Result Value Range    Glucose-Capillary 107 (*) 70 - 99 mg/dL  GLUCOSE, CAPILLARY     Status: None   Collection Time    08/11/13  8:22 PM      Result Value Range   Glucose-Capillary 99  70 - 99 mg/dL  GLUCOSE, CAPILLARY     Status: Abnormal   Collection Time    08/11/13 11:35 PM      Result Value Range   Glucose-Capillary 115 (*) 70 - 99 mg/dL  GLUCOSE, CAPILLARY     Status: Abnormal   Collection Time    08/12/13  4:06 AM      Result Value Range   Glucose-Capillary 109 (*) 70 - 99 mg/dL  CBC     Status: Abnormal   Collection Time    08/12/13  4:50 AM      Result Value Range   WBC 7.8  4.0 - 10.5 K/uL   Comment: WHITE COUNT CONFIRMED ON SMEAR   RBC 3.69 (*) 3.87 - 5.11 MIL/uL   Hemoglobin 11.5 (*) 12.0 - 15.0 g/dL   HCT 95.6 (*) 21.3 - 08.6 %   MCV 91.6  78.0 - 100.0 fL   MCH 31.2  26.0 - 34.0 pg   MCHC 34.0  30.0 - 36.0 g/dL   RDW 57.8  46.9 - 62.9 %   Platelets 321  150 - 400 K/uL  BASIC METABOLIC PANEL     Status: Abnormal   Collection Time    08/12/13  4:50 AM      Result Value Range   Sodium 137  135 - 145 mEq/L   Potassium 3.6  3.5 - 5.1 mEq/L   Chloride 103  96 - 112 mEq/L   CO2 23  19 - 32 mEq/L   Glucose, Bld 112 (*) 70 - 99 mg/dL   BUN 6  6 - 23 mg/dL   Creatinine, Ser 5.78  0.50 - 1.10 mg/dL   Calcium 9.0  8.4 - 46.9 mg/dL   GFR calc non Af Amer 78 (*) >90 mL/min   GFR calc Af Amer >90  >90 mL/min   Comment: (NOTE)     The eGFR has been calculated using the CKD EPI equation.     This calculation has not been validated in all clinical situations.     eGFR's persistently <90 mL/min signify possible Chronic Kidney     Disease.    Imaging / Studies: Dg Abd Acute W/chest  08/11/2013   CLINICAL DATA:  Small bowel obstruction.  EXAM: ACUTE ABDOMEN SERIES (ABDOMEN 2 VIEW & CHEST 1 VIEW)  COMPARISON:  04/05/2009  FINDINGS: Nasogastric tube extends into the stomach. Moderately dilated proximal small bowel loops remain. There is less prominent dilatation of the more distal/mid small  bowel loops. Some air remains in the colon. No evidence of free air.  The chest shows clear lungs with mild bibasilar atelectasis present. The heart size is normal.  IMPRESSION: Persistent evidence of partial small bowel obstruction. Residual dilated loops appear to predominantly represent proximal small bowel loops with some decrease in dilatation of mid to distal small bowel loops.   Electronically Signed   By: Irish Lack M.D.   On: 08/11/2013 08:27    Medications / Allergies: per chart  Antibiotics: Anti-infectives   None

## 2013-08-12 NOTE — Care Management Note (Signed)
    Page 1 of 1   08/12/2013     12:10:30 PM   CARE MANAGEMENT NOTE 08/12/2013  Patient:  Jane English, Jane English   Account Number:  [id number]  Date Initiated:  08/12/2013  Documentation initiated by:  Lorenda Ishihara  Subjective/Objective Assessment:   42 yo female admitted with SBO. PTA lived at home with spouse.     Action/Plan:   Home when stable   Anticipated DC Date:  08/16/2013   Anticipated DC Plan:  HOME/SELF CARE      DC Planning Services  CM consult      Choice offered to / List presented to:             Status of service:  Completed, signed off Medicare Important Message given?   (If response is "NO", the following Medicare IM given date fields will be blank) Date Medicare IM given:   Date Additional Medicare IM given:    Discharge Disposition:  HOME/SELF CARE  Per UR Regulation:  Reviewed for med. necessity/level of care/duration of stay  If discussed at Long Length of Stay Meetings, dates discussed:    Comments:

## 2013-08-12 NOTE — Progress Notes (Signed)
Peripherally Inserted Central Catheter/Midline Placement  The IV Nurse has discussed with the patient and/or persons authorized to consent for the patient, the purpose of this procedure and the potential benefits and risks involved with this procedure.  The benefits include less needle sticks, lab draws from the catheter and patient may be discharged home with the catheter.  Risks include, but not limited to, infection, bleeding, blood clot (thrombus formation), and puncture of an artery; nerve damage and irregular heat beat.  Alternatives to this procedure were also discussed.  PICC/Midline Placement Documentation        Jane English 08/12/2013, 1:17 PM

## 2013-08-12 NOTE — Progress Notes (Signed)
INITIAL NUTRITION ASSESSMENT  DOCUMENTATION CODES Per approved criteria  -Not Applicable   INTERVENTION: - TPN per pharmacy - Diet advancement per MD - Will continue to monitor   NUTRITION DIAGNOSIS: Inadequate oral intake related to inability to eat as evidenced by NPO.   Goal: TPN to meet >90% of estimated nutritional needs  Monitor:  Weights, labs, diet advancement, BM, TPN  Reason for Assessment: Consult for TPN  42 y.o. female  Admitting Dx: SBO (small bowel obstruction)  ASSESSMENT: Pt with recent laparoscopic surgery 07/30/13, admitted with abdominal pain, bloating, nausea, and vomiting. Eating was making the pain worse PTA. Was unable to keep liquids down and had not tried foods. Pt with NGT in place and plans to start TPN. Pt with partial small bowel obstruction.   Met with pt and husband who report that since pt's surgery, pt has only been able to keep down soups, nothing else, typically just 1/2 to 1 cup/day. Pt's weight down only 1 pound since then but they feel like pt has lost more than that. Pt c/o nausea despite having NGT. Pt with dark green NGT output total yesterday.   Height: Ht Readings from Last 1 Encounters:  08/08/13 5\' 6"  (1.676 m)    Weight: Wt Readings from Last 1 Encounters:  08/08/13 164 lb 14.5 oz (74.8 kg)    Ideal Body Weight: 130 lb   % Ideal Body Weight: 126%  Wt Readings from Last 10 Encounters:  08/08/13 164 lb 14.5 oz (74.8 kg)  07/29/13 165 lb (74.844 kg)  07/29/13 165 lb (74.844 kg)  07/19/13 164 lb 8 oz (74.617 kg)  07/07/13 167 lb 6.4 oz (75.932 kg)  06/11/13 168 lb (76.204 kg)  06/07/13 166 lb 12.8 oz (75.66 kg)  06/04/13 168 lb 6.9 oz (76.4 kg)  05/21/13 168 lb 8 oz (76.431 kg)  04/23/13 167 lb 6 oz (75.921 kg)    Usual Body Weight: 165 lb   % Usual Body Weight: 99%  BMI:  Body mass index is 26.63 kg/(m^2).  Estimated Nutritional Needs: Kcal: 1650-1750 Protein: 85-95g Fluid: 1.6-1.7L/day  Skin:  Abdominal incision  Diet Order: NPO  EDUCATION NEEDS: -No education needs identified at this time   Intake/Output Summary (Last 24 hours) at 08/12/13 0853 Last data filed at 08/12/13 0544  Gross per 24 hour  Intake   2610 ml  Output   1500 ml  Net   1110 ml    Last BM: 9/30  Labs:   Recent Labs Lab 08/10/13 0455 08/11/13 0507 08/12/13 0450  NA 136 140 137  K 3.5 3.4* 3.6  CL 105 106 103  CO2 23 25 23   BUN 7 <3* 6  CREATININE 0.83 0.85 0.89  CALCIUM 8.4 8.4 9.0  GLUCOSE 107* 117* 112*    CBG (last 3)   Recent Labs  08/11/13 2335 08/12/13 0406 08/12/13 0746  GLUCAP 115* 109* 107*    Scheduled Meds: . bisacodyl  10 mg Rectal Daily  . budesonide-formoterol  2 puff Inhalation BID  . fluticasone  2 spray Each Nare Daily  . heparin  5,000 Units Subcutaneous Q8H  . insulin aspart  0-15 Units Subcutaneous Q4H  . ketorolac  30 mg Intravenous Q6H  . lip balm  1 application Topical BID  . LORazepam  2 mg Intravenous QHS  . magic mouthwash  15 mL Oral QID  . methocarbamol (ROBAXIN) IV  500 mg Intravenous Q8H  . metoCLOPramide (REGLAN) injection  10 mg Intravenous Q6H  .  ondansetron  4 mg Intravenous Q6H  . pantoprazole (PROTONIX) IV  40 mg Intravenous Q12H    Continuous Infusions: . dextrose 5 % and 0.45 % NaCl with KCl 20 mEq/L 100 mL/hr at 08/11/13 2150  . dextrose 5 % and 0.45 % NaCl with KCl 20 mEq/L    . Marland KitchenTPN (CLINIMIX-E) Adult     And  . fat emulsion      Past Medical History  Diagnosis Date  . Migraine headache   . Back pain   . Obesity   . Hypertension   . DVT (deep venous thrombosis) 2010  . Heart murmur     no cardiologist  . Asthma   . Bronchitis   . Depression   . Diabetes mellitus without complication   . Obsessive-compulsive disorder   . PTSD (post-traumatic stress disorder)   . Shortness of breath   . GERD (gastroesophageal reflux disease)   . Anemia   . PSYCHOTIC D/O W/HALLUCINATIONS CONDS CLASS ELSW 03/04/2010    Qualifier:  Diagnosis of  By: Lodema Hong MD, Claris Che    . Asthma flare 04/09/2013  . Seasonal allergies 12/10/2012  . Chronic abdominal pain   . Chronic constipation     Past Surgical History  Procedure Laterality Date  . Appendectomy  1986  . Carpal tunnel release    . Tubal ligation  1994  . Lumbar spine surgery  2010    x2  . Back surgery    . Lysis of adhesion  2003    Dr. Elpidio Anis  . Trigger finger release  2009    right pinkie finger  . Anterior cervical decomp/discectomy fusion  07/07/2012    Procedure: ANTERIOR CERVICAL DECOMPRESSION/DISCECTOMY FUSION 2 LEVELS;  Surgeon: Karn Cassis, MD;  Location: [facility];  Service: Neurosurgery;  Laterality: N/A;  Cervical four-five, five - six  Anterior cervical decompression/diskectomy/fusion/plate  . Partial hysterectomy  1990s?    [place]  . Spinal cord stimulator implant    . Oophorectomy    . Laparoscopy N/A 07/29/2013    Procedure: diagnostic laporoscopy;  Surgeon: Ardeth Sportsman, MD;  Location: [facility];  Service: General;  Laterality: N/A;  . Lysis of adhesion N/A 07/29/2013    Procedure: LYSIS OF ADHESION;  Surgeon: Ardeth Sportsman, MD;  Location: [facility];  Service: General;  Laterality: N/A;  . Bowel resection N/A 07/29/2013    Procedure: serosal repair;  Surgeon: Ardeth Sportsman, MD;  Location: [facility];  Service: General;  Laterality: N/A;    Levon Hedger MS, RD, LDN [phone] Pager [phone] After Hours Pager

## 2013-08-13 ENCOUNTER — Inpatient Hospital Stay (HOSPITAL_COMMUNITY): Payer: Managed Care, Other (non HMO)

## 2013-08-13 DIAGNOSIS — Z515 Encounter for palliative care: Secondary | ICD-10-CM

## 2013-08-13 DIAGNOSIS — G894 Chronic pain syndrome: Secondary | ICD-10-CM

## 2013-08-13 DIAGNOSIS — K56609 Unspecified intestinal obstruction, unspecified as to partial versus complete obstruction: Secondary | ICD-10-CM

## 2013-08-13 LAB — CBC
MCH: 30.7 pg (ref 26.0–34.0)
MCHC: 33.6 g/dL (ref 30.0–36.0)
MCV: 91.3 fL (ref 78.0–100.0)
Platelets: 304 10*3/uL (ref 150–400)
RBC: 3.55 MIL/uL — ABNORMAL LOW (ref 3.87–5.11)
RDW: 13 % (ref 11.5–15.5)

## 2013-08-13 LAB — COMPREHENSIVE METABOLIC PANEL
AST: 9 U/L (ref 0–37)
Albumin: 3.1 g/dL — ABNORMAL LOW (ref 3.5–5.2)
Alkaline Phosphatase: 50 U/L (ref 39–117)
CO2: 24 mEq/L (ref 19–32)
Calcium: 8.7 mg/dL (ref 8.4–10.5)
Chloride: 104 mEq/L (ref 96–112)
Creatinine, Ser: 0.93 mg/dL (ref 0.50–1.10)
GFR calc Af Amer: 85 mL/min — ABNORMAL LOW (ref >90)
GFR calc non Af Amer: 74 mL/min — ABNORMAL LOW (ref >90)
Total Protein: 6.3 g/dL (ref 6.0–8.3)

## 2013-08-13 LAB — GLUCOSE, CAPILLARY
Glucose-Capillary: 119 mg/dL — ABNORMAL HIGH (ref 70–99)
Glucose-Capillary: 122 mg/dL — ABNORMAL HIGH (ref 70–99)
Glucose-Capillary: 118 mg/dL — ABNORMAL HIGH (ref 70–99)
Glucose-Capillary: 104 mg/dL — ABNORMAL HIGH (ref 70–99)

## 2013-08-13 LAB — DIFFERENTIAL
Basophils Absolute: 0 10*3/uL (ref 0.0–0.1)
Basophils Relative: 0 % (ref 0–1)
Eosinophils Relative: 4 % (ref 0–5)
Lymphs Abs: 1.1 10*3/uL (ref 0.7–4.0)
Monocytes Absolute: 1.2 10*3/uL — ABNORMAL HIGH (ref 0.1–1.0)
Neutro Abs: 7.2 10*3/uL (ref 1.7–7.7)

## 2013-08-13 LAB — MAGNESIUM: Magnesium: 1.6 mg/dL (ref 1.5–2.5)

## 2013-08-13 LAB — PHOSPHORUS: Phosphorus: 4 mg/dL (ref 2.3–4.6)

## 2013-08-13 LAB — TRIGLYCERIDES: Triglycerides: 94 mg/dL (ref <150)

## 2013-08-13 MED ORDER — TRACE MINERALS CR-CU-F-FE-I-MN-MO-SE-ZN IV SOLN
INTRAVENOUS | Status: AC
  Administered 2013-08-13: 17:00:00 via INTRAVENOUS
  Filled 2013-08-13: qty 2000

## 2013-08-13 MED ORDER — KCL IN DEXTROSE-NACL 20-5-0.45 MEQ/L-%-% IV SOLN
INTRAVENOUS | Status: AC
  Administered 2013-08-14: 12:00:00 via INTRAVENOUS
  Filled 2013-08-13: qty 1000

## 2013-08-13 MED ORDER — FAT EMULSION 20 % IV EMUL
250.0000 mL | INTRAVENOUS | Status: AC
  Administered 2013-08-13: 250 mL via INTRAVENOUS
  Filled 2013-08-13: qty 250

## 2013-08-13 MED ORDER — POTASSIUM CHLORIDE 10 MEQ/100ML IV SOLN
10.0000 meq | INTRAVENOUS | Status: AC
  Administered 2013-08-13 (×2): 10 meq via INTRAVENOUS
  Filled 2013-08-13 (×2): qty 100

## 2013-08-13 NOTE — Progress Notes (Signed)
PARENTERAL NUTRITION CONSULT NOTE - FOLLOW UP  Pharmacy Consult for TNA Indication: Bowel obstruction  Allergies  Allergen Reactions  . Neurontin [Gabapentin] Other (See Comments)    Other reaction(s): Mental Status Changes (intolerance) hallucinations Sleep walking  . Amoxicillin Hives  . Penicillins Hives  . Latex Rash    Patient Measurements: Height: 5\' 6"  (167.6 cm) Weight: 164 lb 14.5 oz (74.8 kg) IBW/kg (Calculated) : 59.3 Adjusted Body Weight: 64kg Usual Weight: 75kg  Vital Signs: Temp: 98.5 F (36.9 C) (10/10 0630) Temp src: Oral (10/10 0630) BP: 128/82 mmHg (10/10 0630) Pulse Rate: 91 (10/10 0630) Intake/Output from previous day: 10/09 0701 - 10/10 0700 In: 587.5 [I.V.:587.5] Out: 1700 [Urine:1200; Emesis/NG output:500] Intake/Output from this shift:    Labs:  Recent Labs  08/11/13 0507 08/12/13 0450 08/13/13 0610  WBC 7.1 7.8 9.9  HGB 10.6* 11.5* 10.9*  HCT 31.5* 33.8* 32.4*  PLT 314 321 304     Recent Labs  08/11/13 0507 08/12/13 0450 08/13/13 0610  NA 140 137 138  K 3.4* 3.6 3.3*  CL 106 103 104  CO2 25 23 24   GLUCOSE 117* 112* 119*  BUN <3* 6 12  CREATININE 0.85 0.89 0.93  CALCIUM 8.4 9.0 8.7  MG  --   --  1.6  PHOS  --   --  4.0  PROT  --   --  6.3  ALBUMIN  --   --  3.1*  AST  --   --  9  ALT  --   --  8  ALKPHOS  --   --  50  BILITOT  --   --  0.2*  Corrected calcium: 9.42 (10/10)  Estimated Creatinine Clearance: 79.8 ml/min (by C-G formula based on Cr of 0.93).    Recent Labs  08/12/13 2013 08/13/13 0004 08/13/13 0408  GLUCAP 113* 118* 119*    Medications:  Scheduled:  . bisacodyl  10 mg Rectal Daily  . budesonide-formoterol  2 puff Inhalation BID  . fluticasone  2 spray Each Nare Daily  . heparin  5,000 Units Subcutaneous Q8H  . insulin aspart  0-15 Units Subcutaneous Q4H  . ketorolac  30 mg Intravenous Q6H  . lip balm  1 application Topical BID  . LORazepam  2 mg Intravenous QHS  . magic mouthwash  15 mL  Oral QID  . methocarbamol (ROBAXIN) IV  500 mg Intravenous Q8H  . metoCLOPramide (REGLAN) injection  10 mg Intravenous Q6H  . ondansetron  4 mg Intravenous Q6H  . pantoprazole (PROTONIX) IV  40 mg Intravenous Q12H   Infusions:  . dextrose 5 % and 0.45 % NaCl with KCl 20 mEq/L 50 mL/hr at 08/13/13 0600  . Marland KitchenTPN (CLINIMIX-E) Adult 40 mL/hr at 08/12/13 1750   And  . fat emulsion 250 mL (08/12/13 1749)    Insulin Requirements in the past 24 hours:  None - SSI moderate scale q4h ordered  IVF: D5 1/2NS w/ 20KCl  Current Nutrition:  NPO with NGT in place Clinimix E5/15 at 40 ml/hr Lipids 20% at 10 ml/hr  Nutritional Goals:  Per RD 10/9: Kcal: 1650-1750  Protein: 85-95g  Fluid: 1.6-1.7L/day  Clinimix-E 5/15 at 75 mL/hr + Lipids 20% 17mL/hr daily will provide 90 grams protein and 1758 kcal per day  Assessment: 42 y/o F s/p enterolysis of adhesions 07/29/13, presented to ED 08/08/13 with small bowel obstruction. Per [facility], defer surgery at this time. TNA initiated 10/9 pm.   Glucose: at goal <150  Electrolytes: K  low, all others wnl  LFTs: wnl  TGs: pending  Prealbumin:  TPN Access: Double lumen PICC TPN day#: 2  Plan: At 1800 today:  Increase Clinimix E 5/15 to 55 ml/hr  Continue 20% fat emulsion at 32ml/hr  Plan to advance as tolerated to the goal rate of 75 ml/hr  TNA to contain standard multivitamins and trace elements daily  Reduce IVF to 80ml/hr  KCL IV x 2 runs today  Continue SSI q4h   TNA lab panels on Mondays & Thursdays.  F/u daily for TNA tolerance, diet advancement  Loralee Pacas, PharmD, BCPS Pager: [phone] 08/13/2013,7:21 AM

## 2013-08-13 NOTE — Progress Notes (Signed)
Jane English [id number] 10-01-1971  CARE TEAM:  PCP: Syliva Overman, MD  Outpatient Care Team: Patient Care Team: Kerri Perches, MD as PCP - General Ardeth Sportsman, MD as Consulting Physician (General Surgery) Karn Cassis, MD as Consulting Physician (Neurosurgery) Beverley Fiedler, MD as Consulting Physician (Gastroenterology)  Inpatient Treatment Team: Treatment Team: Attending Provider: Ardeth Sportsman, MD; Technician: Vella Raring, NT; Registered Nurse: Tristan Schroeder, RN; Technician: Burnard Bunting, [place]; Consulting Physician: Palliative Doristine Johns; Dietitian: Lavena Bullion, RD; Technician: Lynden Ang, NT; Registered Nurse: Jonathon Bellows, RN; Registered Nurse: Rayburn Ma, RN   Subjective:  Walked in hallways more. +flatus Tol NGT clamping when walking or outside off floor  Objective:  Vital signs:  Filed Vitals:   08/12/13 0805 08/12/13 2104 08/12/13 2230 08/13/13 0630  BP:   145/89 128/82  Pulse:   105 91  Temp:   100.7 F (38.2 C) 98.5 F (36.9 C)  TempSrc:   Oral Oral  Resp:   18 18  Height:      Weight:      SpO2: 98% 99% 100% 100%    Last BM Date: 08/12/13 (pt stated she had small loose bm )  Intake/Output   Yesterday:  10/09 0701 - 10/10 0700 In: 587.5 [I.V.:587.5] Out: 1700 [Urine:1200; Emesis/NG output:500] This shift:     Bowel function:  Flatus: YES  BM: n  Drain: thin bilious output  Physical Exam:  General: Pt awake/alert/oriented x4 in no acute distress Eyes: PERRL, normal EOM.  Sclera clear.  No icterus Neuro: CN II-XII intact w/o focal sensory/motor deficits. Lymph: No head/neck/groin lymphadenopathy Psych:  No delerium/psychosis/paranoia.  A little more interactive.  Still obviously depressed HENT: Normocephalic, Mucus membranes moist.  No thrush.  NGT in place Neck: Supple, No tracheal deviation Chest: No chest wall pain w good excursion CV:  Pulses intact.  Regular  rhythm MS: Normal AROM mjr joints.  No obvious deformity Abdomen: Soft.  Obese.  Mildly distended.  Nontender - improved.  No severe pain with cough or percussion or rocking in chair; therefore, no strong evidence of peritonitis.  No incarcerated hernias. Ext:  SCDs BLE.  No mjr edema.  No cyanosis Skin: No petechiae / purpura   Problem List:   Principal Problem:   SBO (small bowel obstruction) Active Problems:   Chronic pain syndrome   Anxiety state, unspecified   DEPRESSION, CHRONIC   Chronic constipation   PTSD (post-traumatic stress disorder)  SURGERY 07/29/2013 POST-OPERATIVE DIAGNOSIS: recurrent abdominal pain,extensive adhesions   PROCEDURE: Procedure(s):  diagnostic laporoscopy  LYSIS OF ADHESIONS  serosal repair  SURGEON: Surgeon(s):  Ardeth Sportsman, MD  Kandis Cocking, MD - Asst      Assessment  Jane English  42 y.o. female       Partial small bowel obstruction in patient with chronic pain on chronic narcotics s/p significant adhesiolysis - improving  Plan:  -Continue IV fluid hydration.   -TNA -NGT decompression - try clamping trials w sips/liquids -Round-the-clock nausea controlled with ondansetron & metoclopramide as well. -recheck x-rays in AM to see what the status is. -palliate sore throat -Left colon decompressed.  Doubt enema will help but may reconsider tomorrow.  Continue suppositories to help stimulate return of bowel function.  Try to keep narcotics minimal without severe pain.  Using robaxin RTC.  Add IV toradol as well.  Would not be able to tolerate them anyway.  Just IV morphine sparingly if possible.  I do agree she needs to get back to her chronic pain specialist [facility] at some point, but this is not a great time to radically change her regimen if possible.  Hold off on palliative care consult at this time as may be confusing.  Poor time to try and operate on her.  I would wait very long time if possible.  No tachycardia, fever,  leukocytosis, definite peritonitis.  Therefore, hold off.  Suspect is related to inflammation and should resolve over time.  -VTE prophylaxis- SCDs, etc  -mobilize as tolerated to help recovery.  The more she can walk, the better  -?control depression.  Difficult with ileus/SBO.  See what palliative care has to offer.  D/w Palliative Care team  Husband had many questions.  I get the strong sense they feel like nothing is being done.  Again went over the numerous things were doing with rehydration, nasogastric tube decompression, round-the-clock nausea control, primary lead to stimulate bowel function, encouraging activity, involving palliative care.  I noted that the NG tube output is more than, the abdomen is less distended and less painful, and gas is starting to show him the colon on x-rays yesterday.  Those are small but definite signs of improvement.  I cautioned against rushing into surgery at this moment.  It may come to that, but if she is stronger and better hydrated and her abdomen is less distended, it will be a safer operation.  They expressed understanding of my reasoning.  I sensed that they had little enthusiasm for it but it least were not belligerent.     Ardeth Sportsman, M.D., F.A.C.S. Gastrointestinal and Minimally Invasive Surgery [facility] [address] [place], [place] [address] [phone] Main / Paging   08/13/2013   Results:   Labs: Results for orders placed during the hospital encounter of 08/08/13 (from the past 48 hour(s))  GLUCOSE, CAPILLARY     Status: Abnormal   Collection Time    08/11/13  8:34 AM      Result Value Range   Glucose-Capillary 113 (*) 70 - 99 mg/dL   Comment 1 Notify RN     Comment 2 Documented in Chart    GLUCOSE, CAPILLARY     Status: Abnormal   Collection Time    08/11/13  1:39 PM      Result Value Range   Glucose-Capillary 107 (*) 70 - 99 mg/dL  GLUCOSE, CAPILLARY     Status: None    Collection Time    08/11/13  8:22 PM      Result Value Range   Glucose-Capillary 99  70 - 99 mg/dL  GLUCOSE, CAPILLARY     Status: Abnormal   Collection Time    08/11/13 11:35 PM      Result Value Range   Glucose-Capillary 115 (*) 70 - 99 mg/dL  GLUCOSE, CAPILLARY     Status: Abnormal   Collection Time    08/12/13  4:06 AM      Result Value Range   Glucose-Capillary 109 (*) 70 - 99 mg/dL  CBC     Status: Abnormal   Collection Time    08/12/13  4:50 AM      Result Value Range   WBC 7.8  4.0 - 10.5 K/uL   Comment: WHITE COUNT CONFIRMED ON SMEAR   RBC 3.69 (*) 3.87 - 5.11 MIL/uL   Hemoglobin 11.5 (*) 12.0 - 15.0 g/dL   HCT 86.5 (*) 78.4 -  46.0 %   MCV 91.6  78.0 - 100.0 fL   MCH 31.2  26.0 - 34.0 pg   MCHC 34.0  30.0 - 36.0 g/dL   RDW 16.1  09.6 - 04.5 %   Platelets 321  150 - 400 K/uL  BASIC METABOLIC PANEL     Status: Abnormal   Collection Time    08/12/13  4:50 AM      Result Value Range   Sodium 137  135 - 145 mEq/L   Potassium 3.6  3.5 - 5.1 mEq/L   Chloride 103  96 - 112 mEq/L   CO2 23  19 - 32 mEq/L   Glucose, Bld 112 (*) 70 - 99 mg/dL   BUN 6  6 - 23 mg/dL   Creatinine, Ser 4.09  0.50 - 1.10 mg/dL   Calcium 9.0  8.4 - 81.1 mg/dL   GFR calc non Af Amer 78 (*) >90 mL/min   GFR calc Af Amer >90  >90 mL/min   Comment: (NOTE)     The eGFR has been calculated using the CKD EPI equation.     This calculation has not been validated in all clinical situations.     eGFR's persistently <90 mL/min signify possible Chronic Kidney     Disease.  GLUCOSE, CAPILLARY     Status: Abnormal   Collection Time    08/12/13  7:46 AM      Result Value Range   Glucose-Capillary 107 (*) 70 - 99 mg/dL  GLUCOSE, CAPILLARY     Status: None   Collection Time    08/12/13  1:48 PM      Result Value Range   Glucose-Capillary 89  70 - 99 mg/dL  GLUCOSE, CAPILLARY     Status: Abnormal   Collection Time    08/12/13  6:44 PM      Result Value Range   Glucose-Capillary 110 (*) 70 - 99 mg/dL   GLUCOSE, CAPILLARY     Status: Abnormal   Collection Time    08/12/13  8:13 PM      Result Value Range   Glucose-Capillary 113 (*) 70 - 99 mg/dL  GLUCOSE, CAPILLARY     Status: Abnormal   Collection Time    08/13/13 12:04 AM      Result Value Range   Glucose-Capillary 118 (*) 70 - 99 mg/dL  GLUCOSE, CAPILLARY     Status: Abnormal   Collection Time    08/13/13  4:08 AM      Result Value Range   Glucose-Capillary 119 (*) 70 - 99 mg/dL  CBC     Status: Abnormal   Collection Time    08/13/13  6:10 AM      Result Value Range   WBC 9.9  4.0 - 10.5 K/uL   RBC 3.55 (*) 3.87 - 5.11 MIL/uL   Hemoglobin 10.9 (*) 12.0 - 15.0 g/dL   HCT 91.4 (*) 78.2 - 95.6 %   MCV 91.3  78.0 - 100.0 fL   MCH 30.7  26.0 - 34.0 pg   MCHC 33.6  30.0 - 36.0 g/dL   RDW 21.3  08.6 - 57.8 %   Platelets 304  150 - 400 K/uL  COMPREHENSIVE METABOLIC PANEL     Status: Abnormal   Collection Time    08/13/13  6:10 AM      Result Value Range   Sodium 138  135 - 145 mEq/L   Potassium 3.3 (*) 3.5 - 5.1 mEq/L   Chloride  104  96 - 112 mEq/L   CO2 24  19 - 32 mEq/L   Glucose, Bld 119 (*) 70 - 99 mg/dL   BUN 12  6 - 23 mg/dL   Creatinine, Ser 1.61  0.50 - 1.10 mg/dL   Calcium 8.7  8.4 - 09.6 mg/dL   Total Protein 6.3  6.0 - 8.3 g/dL   Albumin 3.1 (*) 3.5 - 5.2 g/dL   AST 9  0 - 37 U/L   ALT 8  0 - 35 U/L   Alkaline Phosphatase 50  39 - 117 U/L   Total Bilirubin 0.2 (*) 0.3 - 1.2 mg/dL   GFR calc non Af Amer 74 (*) >90 mL/min   GFR calc Af Amer 85 (*) >90 mL/min   Comment: (NOTE)     The eGFR has been calculated using the CKD EPI equation.     This calculation has not been validated in all clinical situations.     eGFR's persistently <90 mL/min signify possible Chronic Kidney     Disease.  MAGNESIUM     Status: None   Collection Time    08/13/13  6:10 AM      Result Value Range   Magnesium 1.6  1.5 - 2.5 mg/dL  PHOSPHORUS     Status: None   Collection Time    08/13/13  6:10 AM      Result Value Range    Phosphorus 4.0  2.3 - 4.6 mg/dL  DIFFERENTIAL     Status: Abnormal   Collection Time    08/13/13  6:10 AM      Result Value Range   Neutrophils Relative % 73  43 - 77 %   Neutro Abs 7.2  1.7 - 7.7 K/uL   Lymphocytes Relative 11 (*) 12 - 46 %   Lymphs Abs 1.1  0.7 - 4.0 K/uL   Monocytes Relative 12  3 - 12 %   Monocytes Absolute 1.2 (*) 0.1 - 1.0 K/uL   Eosinophils Relative 4  0 - 5 %   Eosinophils Absolute 0.4  0.0 - 0.7 K/uL   Basophils Relative 0  0 - 1 %   Basophils Absolute 0.0  0.0 - 0.1 K/uL    Imaging / Studies: Dg Abd Acute W/chest  08/11/2013   CLINICAL DATA:  Small bowel obstruction.  EXAM: ACUTE ABDOMEN SERIES (ABDOMEN 2 VIEW & CHEST 1 VIEW)  COMPARISON:  04/05/2009  FINDINGS: Nasogastric tube extends into the stomach. Moderately dilated proximal small bowel loops remain. There is less prominent dilatation of the more distal/mid small bowel loops. Some air remains in the colon. No evidence of free air.  The chest shows clear lungs with mild bibasilar atelectasis present. The heart size is normal.  IMPRESSION: Persistent evidence of partial small bowel obstruction. Residual dilated loops appear to predominantly represent proximal small bowel loops with some decrease in dilatation of mid to distal small bowel loops.   Electronically Signed   By: Irish Lack M.D.   On: 08/11/2013 08:27    Medications / Allergies: per chart  Antibiotics: Anti-infectives   None

## 2013-08-13 NOTE — Consult Note (Signed)
Patient Jane English      DOB: [birth year]      UYQ:[id number]     Consult Note from the Palliative Medicine Team at [facility]    Consult Requested by: Dr Michaell Cowing     PCP: Syliva Overman, MD Reason for Consultation:Comprehensive support and                                 symptom management     Phone [phone]   CC: Abdominal  Pain         H/o: chronic back pain   Brief Jane English:OACZ V Jane English is a 42 y.o. (DOB: 10-Mar-1971) AA female whose primary care physician is Syliva Overman, MD and comes to [facility] ER for abdominal pain and bloating.  She is accompanied with her husband, Yulitza Shorts.  She had a S/P enterolysis of adhesions by Dr. Michaell Cowing - 07/29/2013. States that she has not "been right since surgery". C/o gen abd pain and bloating. States that she has not had a BM since Tuesday, 08/03/2013. C/o NV. Part of the reason for her surgery was chronic RLQ abdominal pain. Her prior abdominal surgery includes: enterolysis of adhesions by Dr. Maggie Schwalbe 04/02/2002, appendectomy - 1986, and partial hysterectomy - 1990's.  She saw Dr. Chestine Spore from a GI standpoint before surgery. She was H. Pylori positive, has GERD on PPI, and she has had chronic trouble with constipation.  Prior to her surgery by Dr. Michaell Cowing, she has had significant abdominal and back pain issues. She tells me she has had multiple back/spineal surgeries and has an implanted stimulator. She sees a pain management specialist.   She reports that   Polypharmacy, per patient she was on 30 different medications at home   She has a complex psych history; self reported PTSD, Bipolar (with auditory hallucinations), depression, anxiety, suicide attempt in the past.   ROS:  intermittent abdominal pain, report less depression presently"than in the past", denies suicidal ideation presently     PMH:  Past Medical History  Diagnosis Date  . Migraine headache   . Back pain   . Obesity   . Hypertension   . DVT (deep venous  thrombosis) 2010  . Heart murmur     no cardiologist  . Asthma   . Bronchitis   . Depression   . Diabetes mellitus without complication   . Obsessive-compulsive disorder   . PTSD (post-traumatic stress disorder)   . Shortness of breath   . GERD (gastroesophageal reflux disease)   . Anemia   . 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 Asthma flare 04/09/2013  . Seasonal allergies 12/10/2012  . Chronic abdominal pain   . Chronic constipation      PSH: Past Surgical History  Procedure Laterality Date  . Appendectomy  1986  . Carpal tunnel release    . Tubal ligation  1994  . Lumbar spine surgery  2010    x2  . Back surgery    . Lysis of adhesion  2003    Dr. Elpidio Anis  . Trigger finger release  2009    right pinkie finger  . Anterior cervical decomp/discectomy fusion  07/07/2012    Procedure: ANTERIOR CERVICAL DECOMPRESSION/DISCECTOMY FUSION 2 LEVELS;  Surgeon: Karn Cassis, MD;  Location: [facility];  Service: Neurosurgery;  Laterality: N/A;  Cervical four-five, five -  six  Anterior cervical decompression/diskectomy/fusion/plate  . Partial hysterectomy  1990s?    [place]  . Spinal cord stimulator implant    . Oophorectomy    . Laparoscopy N/A 07/29/2013    Procedure: diagnostic laporoscopy;  Surgeon: Ardeth Sportsman, MD;  Location: [facility] ORS;  Service: General;  Laterality: N/A;  . Lysis of adhesion N/A 07/29/2013    Procedure: LYSIS OF ADHESION;  Surgeon: Ardeth Sportsman, MD;  Location: [facility] ORS;  Service: General;  Laterality: N/A;  . Bowel resection N/A 07/29/2013    Procedure: serosal repair;  Surgeon: Ardeth Sportsman, MD;  Location: [facility] ORS;  Service: General;  Laterality: N/A;   I have reviewed the FH and SH and  If appropriate update it with new information. Allergies  Allergen Reactions  . Neurontin [Gabapentin] Other (See Comments)    Other reaction(s): Mental Status Changes  (intolerance) hallucinations Sleep walking  . Amoxicillin Hives  . Penicillins Hives  . Latex Rash   Scheduled Meds: . bisacodyl  10 mg Rectal Daily  . budesonide-formoterol  2 puff Inhalation BID  . fluticasone  2 spray Each Nare Daily  . heparin  5,000 Units Subcutaneous Q8H  . insulin aspart  0-15 Units Subcutaneous Q4H  . ketorolac  30 mg Intravenous Q6H  . lip balm  1 application Topical BID  . LORazepam  2 mg Intravenous QHS  . magic mouthwash  15 mL Oral QID  . methocarbamol (ROBAXIN) IV  500 mg Intravenous Q8H  . metoCLOPramide (REGLAN) injection  10 mg Intravenous Q6H  . ondansetron  4 mg Intravenous Q6H  . pantoprazole (PROTONIX) IV  40 mg Intravenous Q12H   Continuous Infusions: . dextrose 5 % and 0.45 % NaCl with KCl 20 mEq/L 50 mL/hr at 08/13/13 0757  . dextrose 5 % and 0.45 % NaCl with KCl 20 mEq/L    . Marland KitchenTPN (CLINIMIX-E) Adult 40 mL/hr at 08/12/13 1750   And  . fat emulsion 250 mL (08/12/13 1749)  . Marland KitchenTPN (CLINIMIX-E) Adult     And  . fat emulsion     PRN Meds:.acetaminophen, alum & mag hydroxide-simeth, diclofenac sodium, diphenhydrAMINE, ipratropium, lidocaine, LORazepam, menthol-cetylpyridinium, metoprolol, morphine injection, phenol, sodium chloride    BP 128/82  Pulse 91  Temp(Src) 98.5 F (36.9 C) (Oral)  Resp 18  Ht 5\' 6"  (1.676 m)  Wt 74.8 kg (164 lb 14.5 oz)  BMI 26.63 kg/m2  SpO2 95%  LMP 07/31/2013     Intake/Output Summary (Last 24 hours) at 08/13/13 1538 Last data filed at 08/13/13 1409  Gross per 24 hour  Intake  707.5 ml  Output   2901 ml  Net -2193.5 ml   LBM: 08-13-13                         Physical Exam:  Pain: reports she is comfortable presently and is "feeling better".  She tells me she is trying to limit her request of IV pian medications.  She is tolerating clear liquids   General: OOB in chair,  NAD, fully engaged in conversation HEENT:  Mm, no exudate Chest:   CTA CVS: RRR Abdomen: soft NT, + BS (reports stool  and flatus) Ext: without edema Neuro:alert and oriented Psych: Denies suicidal ideations, auditory hallucinations, engaged in conversation, mood congruent with situation  Labs: CBC    Component Value Date/Time   WBC 9.9 08/13/2013 0610   RBC 3.55* 08/13/2013 0610   HGB 10.9* 08/13/2013 0610  HCT 32.4* 08/13/2013 0610   PLT 304 08/13/2013 0610   MCV 91.3 08/13/2013 0610   MCH 30.7 08/13/2013 0610   MCHC 33.6 08/13/2013 0610   RDW 13.0 08/13/2013 0610   LYMPHSABS 1.1 08/13/2013 0610   MONOABS 1.2* 08/13/2013 0610   EOSABS 0.4 08/13/2013 0610   BASOSABS 0.0 08/13/2013 0610    BMET    Component Value Date/Time   NA 138 08/13/2013 0610   K 3.3* 08/13/2013 0610   CL 104 08/13/2013 0610   CO2 24 08/13/2013 0610   GLUCOSE 119* 08/13/2013 0610   BUN 12 08/13/2013 0610   CREATININE 0.93 08/13/2013 0610   CREATININE 0.86 06/07/2013 1114   CALCIUM 8.7 08/13/2013 0610   GFRNONAA 74* 08/13/2013 0610   GFRAA 85* 08/13/2013 0610    CMP     Component Value Date/Time   NA 138 08/13/2013 0610   K 3.3* 08/13/2013 0610   CL 104 08/13/2013 0610   CO2 24 08/13/2013 0610   GLUCOSE 119* 08/13/2013 0610   BUN 12 08/13/2013 0610   CREATININE 0.93 08/13/2013 0610   CREATININE 0.86 06/07/2013 1114   CALCIUM 8.7 08/13/2013 0610   PROT 6.3 08/13/2013 0610   ALBUMIN 3.1* 08/13/2013 0610   AST 9 08/13/2013 0610   ALT 8 08/13/2013 0610   ALKPHOS 50 08/13/2013 0610   BILITOT 0.2* 08/13/2013 0610   GFRNONAA 74* 08/13/2013 0610   GFRAA 85* 08/13/2013 0610   Assessment and plan  This patient has complex physical and psych-mental health history..  She recognizes the impact of her past traumas on her life and her physical conditions and has sought counseling in the past, she has had behavioral health hospitalizations.   She acknowledges  that she doesn't follow though or remain consistant with recommended treatment plan.  It is difficult for her to stay motivated  She report she likes her most  recent therapist Adrienne Mocha # [phone] LCSW and as a result of today's discussion tells me she will make an appointment today for after discharge from hospital.  Discussed in detail the importance of self advocacy and self responsibility in her physical and mental health and well being. Discussion regarding -role of nutrition and exercise -development of coping mechanisms;exercise, music, journaling, prayer -healthy relationship -support form family and friends and church -professional psych intervention and maintenance   She is encouraged to make appointment with her therapist and psychiatrist for appointment once discahrged    1.  Chronic pain: patient has now been off most of her medications for several days.  I would take this opportunity to closely look at all medications and reintroduce one at a time.  She and her husband strongly agree.  They would like to attempt to minimize all medications and carefully reintroduce "only the most necessary ones"  -For pain I would  initiate (when taking POs)  Oxycodone IR 15 mg every 4 hrs prn ONLY -begin to wean off IV   medications in preparation for oral meds only   I will follow up with her on Monday morning,  She expresses interest in continued conversation and engagement.   Time In Time Out Total Time Spent with Patient Total Overall Time  1400 1530 80 min 90 min    Greater than 50%  of this time was spent counseling and coordinating care related to the above assessment and plan.  Lorinda Creed NP  Palliative Medicine Team Team Phone # [phone] Pager [phone]  Discussed with Dr Michaell Cowing

## 2013-08-13 NOTE — Progress Notes (Signed)
Pt's NG tube has been clamped per physician order to see how pt tolerated being unattached. Pt has been unclamped for about 10 hrs without pt requesting anything additional for pain and her pain has been at a tolerable level to none with Toradol and other prescribed therapies. Pt tolerated a popcicle, small amount of jello for lunch and for dinner a very small amount of tea. Pt has just now complained of nausea, bloating and pain. Attached NG tube back to low intermittant wall suction and immediately 400 cc return within 15-20 min. PRN IV pain medication has been given to try and help with pts pain and discomfort.

## 2013-08-14 ENCOUNTER — Inpatient Hospital Stay (HOSPITAL_COMMUNITY): Payer: Managed Care, Other (non HMO)

## 2013-08-14 DIAGNOSIS — R109 Unspecified abdominal pain: Secondary | ICD-10-CM | POA: Diagnosis present

## 2013-08-14 DIAGNOSIS — Z515 Encounter for palliative care: Secondary | ICD-10-CM

## 2013-08-14 LAB — CBC
MCH: 30.3 pg (ref 26.0–34.0)
MCHC: 33.1 g/dL (ref 30.0–36.0)
MCV: 91.5 fL (ref 78.0–100.0)
Platelets: 277 10*3/uL (ref 150–400)
RBC: 3.43 MIL/uL — ABNORMAL LOW (ref 3.87–5.11)
RDW: 13.2 % (ref 11.5–15.5)
WBC: 7.3 10*3/uL (ref 4.0–10.5)

## 2013-08-14 LAB — GLUCOSE, CAPILLARY
Glucose-Capillary: 104 mg/dL — ABNORMAL HIGH (ref 70–99)
Glucose-Capillary: 119 mg/dL — ABNORMAL HIGH (ref 70–99)

## 2013-08-14 LAB — BASIC METABOLIC PANEL
CO2: 22 mEq/L (ref 19–32)
Calcium: 8.7 mg/dL (ref 8.4–10.5)
Creatinine, Ser: 0.89 mg/dL (ref 0.50–1.10)
GFR calc Af Amer: >90 mL/min (ref >90)

## 2013-08-14 LAB — PHOSPHORUS: Phosphorus: 4.4 mg/dL (ref 2.3–4.6)

## 2013-08-14 MED ORDER — KCL IN DEXTROSE-NACL 20-5-0.45 MEQ/L-%-% IV SOLN
INTRAVENOUS | Status: DC
  Administered 2013-08-16: 20 mL/h via INTRAVENOUS
  Administered 2013-08-17: 01:00:00 via INTRAVENOUS
  Administered 2013-08-18 – 2013-08-26 (×3): 20 mL/h via INTRAVENOUS
  Administered 2013-08-30: 19:00:00 via INTRAVENOUS
  Filled 2013-08-14 (×14): qty 1000

## 2013-08-14 MED ORDER — FAT EMULSION 20 % IV EMUL
250.0000 mL | INTRAVENOUS | Status: AC
  Administered 2013-08-14: 250 mL via INTRAVENOUS
  Filled 2013-08-14: qty 250

## 2013-08-14 MED ORDER — SUMATRIPTAN SUCCINATE 100 MG PO TABS
100.0000 mg | ORAL_TABLET | Freq: Two times a day (BID) | ORAL | Status: DC | PRN
  Filled 2013-08-14: qty 1

## 2013-08-14 MED ORDER — INSULIN ASPART 100 UNIT/ML ~~LOC~~ SOLN
0.0000 [IU] | Freq: Three times a day (TID) | SUBCUTANEOUS | Status: DC
  Administered 2013-08-17: 11 [IU] via SUBCUTANEOUS
  Administered 2013-08-17: 2 [IU] via SUBCUTANEOUS
  Administered 2013-08-17: 3 [IU] via SUBCUTANEOUS
  Administered 2013-08-17: 5 [IU] via SUBCUTANEOUS
  Administered 2013-08-18: 1 [IU] via SUBCUTANEOUS
  Administered 2013-08-18 – 2013-08-20 (×6): 2 [IU] via SUBCUTANEOUS
  Administered 2013-08-20: 3 [IU] via SUBCUTANEOUS
  Administered 2013-08-21 – 2013-08-27 (×17): 2 [IU] via SUBCUTANEOUS

## 2013-08-14 MED ORDER — TRACE MINERALS CR-CU-F-FE-I-MN-MO-SE-ZN IV SOLN
INTRAVENOUS | Status: AC
  Administered 2013-08-14: 17:00:00 via INTRAVENOUS
  Filled 2013-08-14: qty 2000

## 2013-08-14 MED ORDER — RIZATRIPTAN BENZOATE 10 MG PO TBDP
10.0000 mg | ORAL_TABLET | Freq: Two times a day (BID) | ORAL | Status: DC | PRN
  Filled 2013-08-14: qty 1

## 2013-08-14 MED ORDER — ALPRAZOLAM 0.5 MG PO TABS: 0.5000 mg | ORAL_TABLET | Freq: Three times a day (TID) | ORAL | Status: DC | PRN

## 2013-08-14 NOTE — Progress Notes (Signed)
Subjective: NG replaced to suction last night, but feels "about normal" this morning.  Eating jello currently.  +BM and +flatus  Objective: Vital signs in last 24 hours: Temp:  [98.1 F (36.7 C)-99.2 F (37.3 C)] 98.1 F (36.7 C) (10/11 0533) Pulse Rate:  [90-98] 90 (10/11 0533) Resp:  [18] 18 (10/11 0533) BP: (142-147)/(90-92) 147/92 mmHg (10/11 0533) SpO2:  [97 %-99 %] 97 % (10/11 0533) Last BM Date: 08/12/13 (pt stated she had small loose bm )  Intake/Output from previous day: 10/10 0701 - 10/11 0700 In: 1581.7 [P.O.:520; TPN:1061.7] Out: 2201 [Urine:700; Emesis/NG output:1500; Stool:1] Intake/Output this shift:    General appearance: alert, cooperative and no distress Resp: clear to auscultation bilaterally Cardio: normal rate, regular GI: soft, NT, nondistended (she says that this is her normal abdomen),  no peritoneal signs  Lab Results:   Recent Labs  08/13/13 0610 08/14/13 0510  WBC 9.9 7.3  HGB 10.9* 10.4*  HCT 32.4* 31.4*  PLT 304 277   BMET  Recent Labs  08/13/13 0610 08/14/13 0510  NA 138 136  K 3.3* 3.5  CL 104 105  CO2 24 22  GLUCOSE 119* 114*  BUN 12 12  CREATININE 0.93 0.89  CALCIUM 8.7 8.7   PT/INR No results found for this basename: LABPROT, INR,  in the last 72 hours ABG No results found for this basename: PHART, PCO2, PO2, HCO3,  in the last 72 hours  Studies/Results: Dg Abd Acute W/chest  08/13/2013   CLINICAL DATA:  Chronic abdominal pain. Rule out perforation or obstruction. Recent surgery to remove scar by a laparoscopy.  EXAM: ACUTE ABDOMEN SERIES (ABDOMEN 2 VIEW & CHEST 1 VIEW)  COMPARISON:  08/11/2013  FINDINGS: Frontal view of the chest demonstrates a right-sided PICC line which terminates at the low SVC. Nasogastric tube is unchanged with tip at distal stomach. Dorsal spinal stimulator again identified.  Midline trachea. Normal heart size. No pleural effusion or pneumothorax. Clear lungs.  Abdominal films demonstrate no  free intraperitoneal air on upright positioning. Persistent small bowel air-fluid levels, primarily within the upper abdomen.  Small bowel dilatation at maximally 5.1 cm today versus 4.7 cm on prior. Gas identified within the ascending colon. No pneumatosis. Distal gas identified.  IMPRESSION: Findings remain suspicious for partial small bowel obstruction. No acute complication identified.  No acute cardiopulmonary disease.   Electronically Signed   By: Jeronimo Greaves M.D.   On: 08/13/2013 12:23    Anti-infectives: Anti-infectives   None      Assessment/Plan: s/p * No surgery found * She is very frustrated with her lack of progress.  She is again wondering if anything can be done to advance her care or "wake up her intestines".  It sounds like she is improving.  Though she did have her NG replaced to suction last night, she says that she is feeling normal this morning.  Her abdominal xray from yesterday appeared improved to me when compared to prior.  She does still have a few dilated loops of LLQ small bowel but air and stool in the colon and the remainder of the small bowel looks pretty good.  She is requesting some further imaging to rule out obstruction vs. Ileus.  I dont think that would be a bad idea.  I will discuss CT vs small bowel follow through to see if there is any obstruction.  If the contrast passes, then this is most likely continued ileus or due to underlying motility issue.  In the  meantime, since she is tolerating clears and says that her abdomen is "back to normal", we will trial clamping NG again to see how she does clinically.    LOS: 6 days    Lodema Pilot DAVID 08/14/2013

## 2013-08-14 NOTE — Progress Notes (Signed)
Jane English [id number] 09-07-1971  CARE TEAM:  PCP: Syliva Overman, MD  Outpatient Care Team: Patient Care Team: Kerri Perches, MD as PCP - General Ardeth Sportsman, MD as Consulting Physician (General Surgery) Karn Cassis, MD as Consulting Physician (Neurosurgery) Beverley Fiedler, MD as Consulting Physician (Gastroenterology)  Inpatient Treatment Team: Treatment Team: Attending Provider: Ardeth Sportsman, MD; Technician: Vella Raring, NT; Registered Nurse: Tristan Schroeder, RN; Consulting Physician: Palliative Doristine Johns; Dietitian: Lavena Bullion, RD; Registered Nurse: Jonathon Bellows, RN; Registered Nurse: Lattie Haw, RN   Subjective:  Walked in hallways more. ++flatus Tol NGT clamping mostly - but needed LIWS x 1 Spitting up saliva Husband in room  Objective:  Vital signs:  Filed Vitals:   08/13/13 0909 08/13/13 2033 08/13/13 2210 08/14/13 0533  BP:   142/90 147/92  Pulse:   98 90  Temp:   99.2 F (37.3 C) 98.1 F (36.7 C)  TempSrc:   Oral Oral  Resp:   18 18  Height:      Weight:      SpO2: 95% 99% 99% 97%    Last BM Date: 08/12/13 (pt stated she had small loose bm )  Intake/Output   Yesterday:  10/10 0701 - 10/11 0700 In: 1581.7 [P.O.:520; TPN:1061.7] Out: 2201 [Urine:700; Emesis/NG output:1500; Stool:1] This shift:  Total I/O In: 800 [P.O.:240; I.V.:560] Out: 750 [Urine:600; Emesis/NG output:150]  Bowel function:  Flatus: YES  BM: Yes  Drain: thin bilious output  Physical Exam:  General: Pt awake/alert/oriented x4 in no acute distress Eyes: PERRL, normal EOM.  Sclera clear.  No icterus Neuro: CN II-XII intact w/o focal sensory/motor deficits. Lymph: No head/neck/groin lymphadenopathy Psych:  No delerium/psychosis/paranoia.  A little more interactive.  Still obviously depressed HENT: Normocephalic, Mucus membranes moist.  No thrush.  NGT in place Neck: Supple, No tracheal deviation Chest: No chest wall pain w  good excursion CV:  Pulses intact.  Regular rhythm MS: Normal AROM mjr joints.  No obvious deformity Abdomen: Soft.  Obese.  Nondistended.  Nontender - improved.  No pain with cough or percussion or rocking in chair; therefore, no evidence of peritonitis.  No incarcerated hernias. Ext:  SCDs BLE.  No mjr edema.  No cyanosis Skin: No petechiae / purpura   Problem List:   Principal Problem:   SBO (small bowel obstruction) Active Problems:   Chronic pain syndrome   Anxiety state, unspecified   DEPRESSION, CHRONIC   Chronic constipation   PTSD (post-traumatic stress disorder)  SURGERY 07/29/2013 POST-OPERATIVE DIAGNOSIS: recurrent abdominal pain,extensive adhesions   PROCEDURE: Procedure(s):  diagnostic laporoscopy  LYSIS OF ADHESIONS  serosal repair  SURGEON: Surgeon(s):  Ardeth Sportsman, MD  Kandis Cocking, MD - Asst      Assessment  Jane English  42 y.o. female       Partial small bowel obstruction in patient with chronic pain on chronic narcotics s/p significant adhesiolysis - improving  Plan:  -Continue IV fluid hydration.    -TNA  -NGT clamping - pt & husband wish to hold on d/c NGT until tonight  -Round-the-clock nausea controlled with ondansetron & metoclopramide as well.  -recheck x-rays in AM to see what the status is.  -palliate sore throat  Try to keep narcotics minimal without severe pain.  Using robaxin RTC.  Add IV toradol as well.  Would not be able to tolerate them PO anyway.  Just IV morphine sparingly if possible.  I do agree she  needs to get back to her chronic pain specialist at [facility] at some point, but this is not a great time to radically change her regimen if possible.     No tachycardia, fever, leukocytosis, definite peritonitis.  Therefore, hold off on surgery unless cannot tolerate PO w NGT out or worsens.  Suspect is related to inflammation and should resolve over time.  -VTE prophylaxis- SCDs, etc  -mobilize as tolerated to  help recovery.  The more she can walk, the better - she has made improvements with that.  -?control depression.  Difficult with ileus/SBO.  See what palliative care has to offer.  D/w Palliative Care team  Again went over the numerous things were doing with rehydration, nasogastric tube decompression, TNA, round-the-clock nausea control, primary lead to stimulate bowel function, encouraging activity, involving palliative care.  She has small but definite signs of improvement.  I cautioned against rushing into surgery at this moment.  It may come to that, but if she is stronger and better hydrated and her abdomen is less distended, it will be a safer operation.  They expressed understanding of my reasoning.  I sensed that they had more willingness to take my advice.  Husband more hopeful.  Maybe [place] as well...     Ardeth Sportsman, M.D., F.A.C.S. Gastrointestinal and Minimally Invasive Surgery [facility] [address], [address] [place], [place] [address] [phone] Main / Paging   08/14/2013   Results:   Labs: Results for orders placed during the hospital encounter of 08/08/13 (from the past 48 hour(s))  GLUCOSE, CAPILLARY     Status: Abnormal   Collection Time    08/12/13  6:44 PM      Result Value Range   Glucose-Capillary 110 (*) 70 - 99 mg/dL  GLUCOSE, CAPILLARY     Status: Abnormal   Collection Time    08/12/13  8:13 PM      Result Value Range   Glucose-Capillary 113 (*) 70 - 99 mg/dL  GLUCOSE, CAPILLARY     Status: Abnormal   Collection Time    08/13/13 12:04 AM      Result Value Range   Glucose-Capillary 118 (*) 70 - 99 mg/dL  GLUCOSE, CAPILLARY     Status: Abnormal   Collection Time    08/13/13  4:08 AM      Result Value Range   Glucose-Capillary 119 (*) 70 - 99 mg/dL  CBC     Status: Abnormal   Collection Time    08/13/13  6:10 AM      Result Value Range   WBC 9.9  4.0 - 10.5 K/uL   RBC 3.55 (*) 3.87 - 5.11 MIL/uL   Hemoglobin  10.9 (*) 12.0 - 15.0 g/dL   HCT 82.9 (*) 56.2 - 13.0 %   MCV 91.3  78.0 - 100.0 fL   MCH 30.7  26.0 - 34.0 pg   MCHC 33.6  30.0 - 36.0 g/dL   RDW 86.5  78.4 - 69.6 %   Platelets 304  150 - 400 K/uL  COMPREHENSIVE METABOLIC PANEL     Status: Abnormal   Collection Time    08/13/13  6:10 AM      Result Value Range   Sodium 138  135 - 145 mEq/L   Potassium 3.3 (*) 3.5 - 5.1 mEq/L   Chloride 104  96 - 112 mEq/L   CO2 24  19 - 32 mEq/L   Glucose, Bld 119 (*) 70 -  99 mg/dL   BUN 12  6 - 23 mg/dL   Creatinine, Ser 1.61  0.50 - 1.10 mg/dL   Calcium 8.7  8.4 - 09.6 mg/dL   Total Protein 6.3  6.0 - 8.3 g/dL   Albumin 3.1 (*) 3.5 - 5.2 g/dL   AST 9  0 - 37 U/L   ALT 8  0 - 35 U/L   Alkaline Phosphatase 50  39 - 117 U/L   Total Bilirubin 0.2 (*) 0.3 - 1.2 mg/dL   GFR calc non Af Amer 74 (*) >90 mL/min   GFR calc Af Amer 85 (*) >90 mL/min   Comment: (NOTE)     The eGFR has been calculated using the CKD EPI equation.     This calculation has not been validated in all clinical situations.     eGFR's persistently <90 mL/min signify possible Chronic Kidney     Disease.  PREALBUMIN     Status: Abnormal   Collection Time    08/13/13  6:10 AM      Result Value Range   Prealbumin 13.3 (*) 17.0 - 34.0 mg/dL   Comment: Performed at [facility]  MAGNESIUM     Status: None   Collection Time    08/13/13  6:10 AM      Result Value Range   Magnesium 1.6  1.5 - 2.5 mg/dL  PHOSPHORUS     Status: None   Collection Time    08/13/13  6:10 AM      Result Value Range   Phosphorus 4.0  2.3 - 4.6 mg/dL  TRIGLYCERIDES     Status: None   Collection Time    08/13/13  6:10 AM      Result Value Range   Triglycerides 94  <150 mg/dL   Comment: Performed at [facility]  DIFFERENTIAL     Status: Abnormal   Collection Time    08/13/13  6:10 AM      Result Value Range   Neutrophils Relative % 73  43 - 77 %   Neutro Abs 7.2  1.7 - 7.7 K/uL   Lymphocytes Relative 11 (*) 12 - 46 %   Lymphs  Abs 1.1  0.7 - 4.0 K/uL   Monocytes Relative 12  3 - 12 %   Monocytes Absolute 1.2 (*) 0.1 - 1.0 K/uL   Eosinophils Relative 4  0 - 5 %   Eosinophils Absolute 0.4  0.0 - 0.7 K/uL   Basophils Relative 0  0 - 1 %   Basophils Absolute 0.0  0.0 - 0.1 K/uL  GLUCOSE, CAPILLARY     Status: Abnormal   Collection Time    08/13/13  7:47 AM      Result Value Range   Glucose-Capillary 122 (*) 70 - 99 mg/dL   Comment 1 Notify RN     Comment 2 Documented in Chart    GLUCOSE, CAPILLARY     Status: Abnormal   Collection Time    08/13/13 11:29 AM      Result Value Range   Glucose-Capillary 118 (*) 70 - 99 mg/dL   Comment 1 Notify RN     Comment 2 Documented in Chart    GLUCOSE, CAPILLARY     Status: Abnormal   Collection Time    08/13/13  3:33 PM      Result Value Range   Glucose-Capillary 104 (*) 70 - 99 mg/dL   Comment 1 Notify RN     Comment 2  Documented in Chart    GLUCOSE, CAPILLARY     Status: Abnormal   Collection Time    08/13/13  8:01 PM      Result Value Range   Glucose-Capillary 121 (*) 70 - 99 mg/dL  GLUCOSE, CAPILLARY     Status: Abnormal   Collection Time    08/13/13 11:30 PM      Result Value Range   Glucose-Capillary 129 (*) 70 - 99 mg/dL   Comment 1 Notify RN     Comment 2 Documented in Chart    GLUCOSE, CAPILLARY     Status: Abnormal   Collection Time    08/14/13  4:17 AM      Result Value Range   Glucose-Capillary 104 (*) 70 - 99 mg/dL   Comment 1 Documented in Chart     Comment 2 Notify RN    CBC     Status: Abnormal   Collection Time    08/14/13  5:10 AM      Result Value Range   WBC 7.3  4.0 - 10.5 K/uL   RBC 3.43 (*) 3.87 - 5.11 MIL/uL   Hemoglobin 10.4 (*) 12.0 - 15.0 g/dL   HCT 16.1 (*) 09.6 - 04.5 %   MCV 91.5  78.0 - 100.0 fL   MCH 30.3  26.0 - 34.0 pg   MCHC 33.1  30.0 - 36.0 g/dL   RDW 40.9  81.1 - 91.4 %   Platelets 277  150 - 400 K/uL  BASIC METABOLIC PANEL     Status: Abnormal   Collection Time    08/14/13  5:10 AM      Result Value Range    Sodium 136  135 - 145 mEq/L   Potassium 3.5  3.5 - 5.1 mEq/L   Chloride 105  96 - 112 mEq/L   CO2 22  19 - 32 mEq/L   Glucose, Bld 114 (*) 70 - 99 mg/dL   BUN 12  6 - 23 mg/dL   Creatinine, Ser 7.82  0.50 - 1.10 mg/dL   Calcium 8.7  8.4 - 95.6 mg/dL   GFR calc non Af Amer 78 (*) >90 mL/min   GFR calc Af Amer >90  >90 mL/min   Comment: (NOTE)     The eGFR has been calculated using the CKD EPI equation.     This calculation has not been validated in all clinical situations.     eGFR's persistently <90 mL/min signify possible Chronic Kidney     Disease.  MAGNESIUM     Status: None   Collection Time    08/14/13  5:10 AM      Result Value Range   Magnesium 1.7  1.5 - 2.5 mg/dL  PHOSPHORUS     Status: None   Collection Time    08/14/13  5:10 AM      Result Value Range   Phosphorus 4.4  2.3 - 4.6 mg/dL  GLUCOSE, CAPILLARY     Status: Abnormal   Collection Time    08/14/13  7:17 AM      Result Value Range   Glucose-Capillary 119 (*) 70 - 99 mg/dL    Imaging / Studies: Dg Abd Acute W/chest  08/13/2013   CLINICAL DATA:  Chronic abdominal pain. Rule out perforation or obstruction. Recent surgery to remove scar by a laparoscopy.  EXAM: ACUTE ABDOMEN SERIES (ABDOMEN 2 VIEW & CHEST 1 VIEW)  COMPARISON:  08/11/2013  FINDINGS: Frontal view of the chest demonstrates a right-sided PICC  line which terminates at the low SVC. Nasogastric tube is unchanged with tip at distal stomach. Dorsal spinal stimulator again identified.  Midline trachea. Normal heart size. No pleural effusion or pneumothorax. Clear lungs.  Abdominal films demonstrate no free intraperitoneal air on upright positioning. Persistent small bowel air-fluid levels, primarily within the upper abdomen.  Small bowel dilatation at maximally 5.1 cm today versus 4.7 cm on prior. Gas identified within the ascending colon. No pneumatosis. Distal gas identified.  IMPRESSION: Findings remain suspicious for partial small bowel obstruction. No  acute complication identified.  No acute cardiopulmonary disease.   Electronically Signed   By: Jeronimo Greaves M.D.   On: 08/13/2013 12:23    Medications / Allergies: per chart  Antibiotics: Anti-infectives   None

## 2013-08-14 NOTE — Progress Notes (Signed)
PARENTERAL NUTRITION CONSULT NOTE - FOLLOW UP  Pharmacy Consult for TNA Indication: Bowel obstruction  Allergies  Allergen Reactions  . Neurontin [Gabapentin] Other (See Comments)    Other reaction(s): Mental Status Changes (intolerance) hallucinations Sleep walking  . Amoxicillin Hives  . Penicillins Hives  . Latex Rash    Patient Measurements: Height: 5\' 6"  (167.6 cm) Weight: 164 lb 14.5 oz (74.8 kg) IBW/kg (Calculated) : 59.3 Adjusted Body Weight: 64kg Usual Weight: 75kg  Vital Signs: Temp: 98.1 F (36.7 C) (10/11 0533) Temp src: Oral (10/11 0533) BP: 147/92 mmHg (10/11 0533) Pulse Rate: 90 (10/11 0533) Intake/Output from previous day: 10/10 0701 - 10/11 0700 In: 1581.7 [P.O.:520; TPN:1061.7] Out: 2201 [Urine:700; Emesis/NG output:1500; Stool:1] Intake/Output from this shift:    Labs:  Recent Labs  08/12/13 0450 08/13/13 0610 08/14/13 0510  WBC 7.8 9.9 7.3  HGB 11.5* 10.9* 10.4*  HCT 33.8* 32.4* 31.4*  PLT 321 304 277     Recent Labs  08/12/13 0450 08/13/13 0610 08/14/13 0510  NA 137 138 136  K 3.6 3.3* 3.5  CL 103 104 105  CO2 23 24 22   GLUCOSE 112* 119* 114*  BUN 6 12 12   CREATININE 0.89 0.93 0.89  CALCIUM 9.0 8.7 8.7  MG  --  1.6 1.7  PHOS  --  4.0 4.4  PROT  --  6.3  --   ALBUMIN  --  3.1*  --   AST  --  9  --   ALT  --  8  --   ALKPHOS  --  50  --   BILITOT  --  0.2*  --   PREALBUMIN  --  13.3*  --   TRIG  --  94  --   Corrected calcium: 9.42 (10/10)  Estimated Creatinine Clearance: 83.4 ml/min (by C-G formula based on Cr of 0.89).    Recent Labs  08/13/13 2001 08/13/13 2330 08/14/13 0417  GLUCAP 121* 129* 104*    Medications:  Scheduled:  . bisacodyl  10 mg Rectal Daily  . budesonide-formoterol  2 puff Inhalation BID  . fluticasone  2 spray Each Nare Daily  . heparin  5,000 Units Subcutaneous Q8H  . insulin aspart  0-15 Units Subcutaneous Q4H  . ketorolac  30 mg Intravenous Q6H  . lip balm  1 application Topical  BID  . LORazepam  2 mg Intravenous QHS  . magic mouthwash  15 mL Oral QID  . metoCLOPramide (REGLAN) injection  10 mg Intravenous Q6H  . ondansetron  4 mg Intravenous Q6H  . pantoprazole (PROTONIX) IV  40 mg Intravenous Q12H   Infusions:  . dextrose 5 % and 0.45 % NaCl with KCl 20 mEq/L 35 mL/hr at 08/13/13 1800  . Marland KitchenTPN (CLINIMIX-E) Adult 55 mL/hr at 08/13/13 1716   And  . fat emulsion 250 mL (08/13/13 1717)    Insulin Requirements in the past 24 hours:  None - SSI moderate scale q4h ordered but patient is refusing  IVF: D5 1/2NS w/ 20KCl at 35 ml/hr  Current Nutrition:  Clear liquids with NGT in place - clamped x 10 hrs yesterday Clinimix E5/15 at 55 ml/hr Lipids 20% at 10 ml/hr  Nutritional Goals:  Per RD 10/9: Kcal: 1650-1750  Protein: 85-95g  Fluid: 1.6-1.7L/day  Clinimix-E 5/15 at 75 mL/hr + Lipids 20% 61mL/hr daily will provide 90 grams protein and 1758 kcal per day  Assessment: 42 y/o F s/p enterolysis of adhesions 07/29/13, presented to ED 08/08/13 with small bowel obstruction.  Per [facility], defer surgery at this time. TNA initiated 10/9 pm.   Glucose: at goal <150, but patient is refusing some checks  Electrolytes: K low/normal after replacement 10/10, all others wnl  LFTs: wnl  TGs: normal, 94 (10/10)  Prealbumin: low, 13.3 (10/10)  TPN Access: Double lumen PICC TPN day#: 3  Plan: At 1800 today:  Increase Clinimix E 5/15 to goal 75 ml/hr  Continue 20% fat emulsion at 4ml/hr  TNA to contain standard multivitamins and trace elements daily  Reduce IVF to [facility]  Defer decision to stop CBGs to MD, since this is an important part of monitoring TNA patient's. Will decrease frequency to q8h for now.  TNA lab panels on Mondays & Thursdays.  F/u daily for TNA tolerance, diet advancement  Loralee Pacas, PharmD, BCPS Pager: [phone] 08/14/2013,7:16 AM

## 2013-08-15 LAB — BASIC METABOLIC PANEL
BUN: 18 mg/dL (ref 6–23)
Calcium: 9.3 mg/dL (ref 8.4–10.5)
Chloride: 103 mEq/L (ref 96–112)
GFR calc Af Amer: >90 mL/min (ref >90)
GFR calc non Af Amer: 85 mL/min — ABNORMAL LOW (ref >90)
Glucose, Bld: 107 mg/dL — ABNORMAL HIGH (ref 70–99)
Potassium: 3.8 mEq/L (ref 3.5–5.1)
Sodium: 136 mEq/L (ref 135–145)

## 2013-08-15 LAB — GLUCOSE, CAPILLARY
Glucose-Capillary: 137 mg/dL — ABNORMAL HIGH (ref 70–99)
Glucose-Capillary: 104 mg/dL — ABNORMAL HIGH (ref 70–99)

## 2013-08-15 MED ORDER — ONDANSETRON HCL 4 MG/2ML IJ SOLN
4.0000 mg | Freq: Four times a day (QID) | INTRAMUSCULAR | Status: DC | PRN
  Administered 2013-08-15 – 2013-08-16 (×3): 4 mg via INTRAVENOUS
  Filled 2013-08-15 (×2): qty 2

## 2013-08-15 MED ORDER — PANTOPRAZOLE SODIUM 40 MG PO TBEC
40.0000 mg | DELAYED_RELEASE_TABLET | Freq: Every day | ORAL | Status: DC
  Filled 2013-08-15: qty 1

## 2013-08-15 MED ORDER — SACCHAROMYCES BOULARDII 250 MG PO CAPS
250.0000 mg | ORAL_CAPSULE | Freq: Two times a day (BID) | ORAL | Status: DC
  Administered 2013-08-15: 250 mg via ORAL
  Filled 2013-08-15 (×6): qty 1

## 2013-08-15 MED ORDER — ALPRAZOLAM 0.5 MG PO TABS
0.5000 mg | ORAL_TABLET | Freq: Two times a day (BID) | ORAL | Status: DC | PRN
  Administered 2013-08-15: 0.5 mg via ORAL
  Filled 2013-08-15: qty 1

## 2013-08-15 MED ORDER — MONTELUKAST SODIUM 10 MG PO TABS
10.0000 mg | ORAL_TABLET | Freq: Every day | ORAL | Status: DC
  Administered 2013-08-15: 10 mg via ORAL
  Filled 2013-08-15 (×3): qty 1

## 2013-08-15 MED ORDER — FAT EMULSION 20 % IV EMUL
250.0000 mL | INTRAVENOUS | Status: AC
  Administered 2013-08-15: 250 mL via INTRAVENOUS
  Filled 2013-08-15: qty 250

## 2013-08-15 MED ORDER — TRACE MINERALS CR-CU-F-FE-I-MN-MO-SE-ZN IV SOLN
INTRAVENOUS | Status: AC
  Administered 2013-08-15: 17:00:00 via INTRAVENOUS
  Filled 2013-08-15: qty 2000

## 2013-08-15 MED ORDER — TOPIRAMATE 25 MG PO TABS
50.0000 mg | ORAL_TABLET | Freq: Every day | ORAL | Status: DC
  Administered 2013-08-15: 50 mg via ORAL
  Filled 2013-08-15 (×3): qty 2

## 2013-08-15 MED ORDER — PANTOPRAZOLE SODIUM 40 MG IV SOLR
40.0000 mg | Freq: Once | INTRAVENOUS | Status: AC
  Administered 2013-08-15: 40 mg via INTRAVENOUS
  Filled 2013-08-15: qty 40

## 2013-08-15 MED ORDER — PANTOPRAZOLE SODIUM 40 MG PO TBEC: 40.0000 mg | DELAYED_RELEASE_TABLET | Freq: Every day | ORAL | Status: DC

## 2013-08-15 MED ORDER — AMLODIPINE BESYLATE 10 MG PO TABS
10.0000 mg | ORAL_TABLET | Freq: Every morning | ORAL | Status: DC
  Administered 2013-08-15: 10 mg via ORAL
  Filled 2013-08-15 (×2): qty 1

## 2013-08-15 MED ORDER — BIS SUBCIT-METRONID-TETRACYC 140-125-125 MG PO CAPS: 3.0000 | ORAL_CAPSULE | Freq: Four times a day (QID) | ORAL | Status: DC

## 2013-08-15 MED ORDER — OLOPATADINE HCL 0.1 % OP SOLN
1.0000 [drp] | Freq: Two times a day (BID) | OPHTHALMIC | Status: DC
  Administered 2013-08-15 – 2013-09-01 (×33): 1 [drp] via OPHTHALMIC
  Filled 2013-08-15: qty 5

## 2013-08-15 MED ORDER — LORAZEPAM 2 MG/ML IJ SOLN: 1.0000 mg | Freq: Three times a day (TID) | INTRAMUSCULAR | Status: DC | PRN

## 2013-08-15 NOTE — Progress Notes (Signed)
Jane English [id number] [birth year]  CARE TEAM:  PCP: Syliva Overman, MD  Outpatient Care Team: Patient Care Team: Kerri Perches, MD as PCP - General Jane Sportsman, MD as Consulting Physician (General Surgery) Karn Cassis, MD as Consulting Physician (Neurosurgery) Beverley Fiedler, MD as Consulting Physician (Gastroenterology)  Inpatient Treatment Team: Treatment Team: Attending Provider: Ardeth Sportsman, MD; Technician: Vella Raring, NT; Registered Nurse: Tristan Schroeder, RN; Consulting Physician: Palliative Doristine Johns; Dietitian: Lavena Bullion, RD; Registered Nurse: Jonathon Bellows, RN; Registered Nurse: Lattie Haw, RN; Technician: Oren Bracket [place], NT   Subjective:  Was sleeping and not arousable initially.  I returned later in the day.  Was not bathroom.  I returned later.  Patient feeling better.  Nasogastric tube out.  Having some flatus.  Wishing to be placed on oral bowel regimen to it her bowels moving.  Wondered if it was safe  Objective:  Vital signs:  Filed Vitals:   08/14/13 1447 08/14/13 2215 08/15/13 0627 08/15/13 0844  BP: 124/83 141/84 128/78   Pulse: 91 90 73   Temp: 98.8 F (37.1 C) 100.5 F (38.1 C) 98.7 F (37.1 C)   TempSrc: Oral Oral Oral   Resp: 18 18 18    Height:      Weight:      SpO2: 100% 100% 100% 100%    Last BM Date: 08/14/13  Intake/Output   Yesterday:  10/11 0701 - 10/12 0700 In: 3727.4 [P.O.:840; I.V.:1072; TPN:1815.4] Out: 1400 [Urine:1250; Emesis/NG output:150] This shift:  Total I/O In: 240 [P.O.:240] Out: 300 [Urine:300]  Bowel function:  Flatus: Yes  BM: Yes  Drain:OUT  Physical Exam:  General: Pt awake/alert/oriented x4 in no acute distress Eyes: PERRL, normal EOM.  Sclera clear.  No icterus Neuro: CN II-XII intact w/o focal sensory/motor deficits. Lymph: No head/neck/groin lymphadenopathy Psych:  No delerium/psychosis/paranoia.  More interactive.  Actually  smiled a little.  A little less depressed HENT: Normocephalic, Mucus membranes moist.  No thrush.  NGT in place Neck: Supple, No tracheal deviation Chest: No chest wall pain w good excursion CV:  Pulses intact.  Regular rhythm MS: Normal AROM mjr joints.  No obvious deformity Abdomen: Soft.  Obese.  Nondistended.  Nontender - improved.  No pain with cough or percussion or rocking in chair; therefore, no evidence of peritonitis.  No incarcerated hernias. Ext:  SCDs BLE.  No mjr edema.  No cyanosis Skin: No petechiae / purpura   Problem List:   Principal Problem:   SBO (small bowel obstruction) Active Problems:   Chronic pain syndrome   Anxiety state, unspecified   DEPRESSION, CHRONIC   Chronic constipation   PTSD (post-traumatic stress disorder)   Palliative care encounter   Abdominal pain, unspecified site  SURGERY 07/29/2013 POST-OPERATIVE DIAGNOSIS: recurrent abdominal pain,extensive adhesions   PROCEDURE: Procedure(s):  diagnostic laporoscopy  LYSIS OF ADHESIONS  serosal repair  SURGEON: Surgeon(s):  Jane Sportsman, MD  Kandis Cocking, MD - Asst      Assessment  Jane English  42 y.o. female       Partial small bowel obstruction in patient with chronic pain on chronic narcotics s/p significant adhesiolysis - improving  Plan:  -Continue IV fluid hydration.    -TNA  -Patient is tolerating clears.  Advanced to fFull liquids today and see she does.  -Round-the-clock nausea control. Wean off ondansetron but keep metoclopramide round-the-clock for now.  -recheck x-rays in AM to see what the status is.  -  palliate sore throat  Try to keep narcotics minimal without severe pain.  Using ketorolac RTC only.  I do agree she needs to get back to her chronic pain specialist at [facility] at some point, but this is not a great time to radically change her regimen if possible.     No tachycardia, fever, leukocytosis, definite peritonitis.  Therefore, hold off on  surgery unless cannot tolerate PO w NGT out or worsens.  Suspect is related to inflammation and should resolve over time.  -VTE prophylaxis- SCDs, etc  -mobilize as tolerated to help recovery.  The more she can walk, the better - she has made improvements with that.  -?control depression.  Difficult with ileus/SBO.  See what palliative care has to offer.  D/w Palliative Care team  Restart Topamax for migraine prophylaxis.  Norvasc for hypertension.  Stop the diuretic and potassium for now.  See if we can simplify her med list  She has small but definite signs of improvement.  I cautioned against rushing into surgery at this moment.  It may come to that, but if she is stronger and better hydrated and her abdomen is less distended, it will be a safer operation.  She  expressed understanding of my reasoning.  I sensed that she is more willing to take my advice.    Start probiotic.  Hold off on any bowel regimen until at least tolerating full liquids.  We will see.   Jane English, M.D., F.A.C.S. Gastrointestinal and Minimally Invasive Surgery [facility] [address] [place], [place] [address] [phone] Main / Paging   08/15/2013   Results:   Labs: Results for orders placed during the hospital encounter of 08/08/13 (from the past 48 hour(s))  GLUCOSE, CAPILLARY     Status: Abnormal   Collection Time    08/13/13 11:29 AM      Result Value Range   Glucose-Capillary 118 (*) 70 - 99 mg/dL   Comment 1 Notify RN     Comment 2 Documented in Chart    GLUCOSE, CAPILLARY     Status: Abnormal   Collection Time    08/13/13  3:33 PM      Result Value Range   Glucose-Capillary 104 (*) 70 - 99 mg/dL   Comment 1 Notify RN     Comment 2 Documented in Chart    GLUCOSE, CAPILLARY     Status: Abnormal   Collection Time    08/13/13  8:01 PM      Result Value Range   Glucose-Capillary 121 (*) 70 - 99 mg/dL  GLUCOSE, CAPILLARY     Status: Abnormal    Collection Time    08/13/13 11:30 PM      Result Value Range   Glucose-Capillary 129 (*) 70 - 99 mg/dL   Comment 1 Notify RN     Comment 2 Documented in Chart    GLUCOSE, CAPILLARY     Status: Abnormal   Collection Time    08/14/13  4:17 AM      Result Value Range   Glucose-Capillary 104 (*) 70 - 99 mg/dL   Comment 1 Documented in Chart     Comment 2 Notify RN    CBC     Status: Abnormal   Collection Time    08/14/13  5:10 AM      Result Value Range   WBC 7.3  4.0 - 10.5 K/uL   RBC 3.43 (*) 3.87 - 5.11 MIL/uL  Hemoglobin 10.4 (*) 12.0 - 15.0 g/dL   HCT 16.1 (*) 09.6 - 04.5 %   MCV 91.5  78.0 - 100.0 fL   MCH 30.3  26.0 - 34.0 pg   MCHC 33.1  30.0 - 36.0 g/dL   RDW 40.9  81.1 - 91.4 %   Platelets 277  150 - 400 K/uL  BASIC METABOLIC PANEL     Status: Abnormal   Collection Time    08/14/13  5:10 AM      Result Value Range   Sodium 136  135 - 145 mEq/L   Potassium 3.5  3.5 - 5.1 mEq/L   Chloride 105  96 - 112 mEq/L   CO2 22  19 - 32 mEq/L   Glucose, Bld 114 (*) 70 - 99 mg/dL   BUN 12  6 - 23 mg/dL   Creatinine, Ser 7.82  0.50 - 1.10 mg/dL   Calcium 8.7  8.4 - 95.6 mg/dL   GFR calc non Af Amer 78 (*) >90 mL/min   GFR calc Af Amer >90  >90 mL/min   Comment: (NOTE)     The eGFR has been calculated using the CKD EPI equation.     This calculation has not been validated in all clinical situations.     eGFR's persistently <90 mL/min signify possible Chronic Kidney     Disease.  MAGNESIUM     Status: None   Collection Time    08/14/13  5:10 AM      Result Value Range   Magnesium 1.7  1.5 - 2.5 mg/dL  PHOSPHORUS     Status: None   Collection Time    08/14/13  5:10 AM      Result Value Range   Phosphorus 4.4  2.3 - 4.6 mg/dL  GLUCOSE, CAPILLARY     Status: Abnormal   Collection Time    08/14/13  7:17 AM      Result Value Range   Glucose-Capillary 119 (*) 70 - 99 mg/dL  GLUCOSE, CAPILLARY     Status: Abnormal   Collection Time    08/14/13  4:44 PM      Result Value  Range   Glucose-Capillary 102 (*) 70 - 99 mg/dL  GLUCOSE, CAPILLARY     Status: Abnormal   Collection Time    08/15/13 12:06 AM      Result Value Range   Glucose-Capillary 117 (*) 70 - 99 mg/dL   Comment 1 Documented in Chart     Comment 2 Notify RN    GLUCOSE, CAPILLARY     Status: Abnormal   Collection Time    08/15/13  7:14 AM      Result Value Range   Glucose-Capillary 137 (*) 70 - 99 mg/dL  BASIC METABOLIC PANEL     Status: Abnormal   Collection Time    08/15/13  8:25 AM      Result Value Range   Sodium 136  135 - 145 mEq/L   Potassium 3.8  3.5 - 5.1 mEq/L   Chloride 103  96 - 112 mEq/L   CO2 23  19 - 32 mEq/L   Glucose, Bld 107 (*) 70 - 99 mg/dL   BUN 18  6 - 23 mg/dL   Creatinine, Ser 2.13  0.50 - 1.10 mg/dL   Calcium 9.3  8.4 - 08.6 mg/dL   GFR calc non Af Amer 85 (*) >90 mL/min   GFR calc Af Amer >90  >90 mL/min   Comment: (NOTE)  The eGFR has been calculated using the CKD EPI equation.     This calculation has not been validated in all clinical situations.     eGFR's persistently <90 mL/min signify possible Chronic Kidney     Disease.    Imaging / Studies: Dg Abd Acute W/chest  08/14/2013   CLINICAL DATA:  42 year old female with abdominal pain -followup small bowel obstruction.  EXAM: ACUTE ABDOMEN SERIES (ABDOMEN 2 VIEW & CHEST 1 VIEW)  COMPARISON:  08/13/2013 and prior radiographs.  FINDINGS: The cardiomediastinal silhouette is unremarkable.  There is no evidence of focal airspace disease, pleural effusion or pneumothorax.  A right PICC line, NG tube and thoracic neurostimulator leads again identified.  Distended small bowel loops with air-fluid levels are again identified. There has been little change in caliber of the dilated small bowel loops measuring up to 5 cm.  Small amount of stool within the colon is noted.  There is no evidence of pneumoperitoneum.  IMPRESSION: Unchanged dilated small bowel loops with air-fluid levels compatible with small bowel  obstruction. No evidence of pneumoperitoneum.  No evidence of acute cardiopulmonary disease.   Electronically Signed   By: Laveda Abbe M.D.   On: 08/14/2013 17:27    Medications / Allergies: per chart  Antibiotics: Anti-infectives   None

## 2013-08-15 NOTE — Progress Notes (Signed)
PARENTERAL NUTRITION CONSULT NOTE - FOLLOW UP  Pharmacy Consult for TNA Indication: Bowel obstruction  Allergies  Allergen Reactions  . Neurontin [Gabapentin] Other (See Comments)    Other reaction(s): Mental Status Changes (intolerance) hallucinations Sleep walking  . Amoxicillin Hives  . Penicillins Hives  . Latex Rash    Patient Measurements: Height: 5\' 6"  (167.6 cm) Weight: 164 lb 14.5 oz (74.8 kg) IBW/kg (Calculated) : 59.3 Adjusted Body Weight: 64kg Usual Weight: 75kg  Vital Signs: Temp: 98.7 F (37.1 C) (10/12 0627) Temp src: Oral (10/12 0627) BP: 128/78 mmHg (10/12 0627) Pulse Rate: 73 (10/12 0627) Intake/Output from previous day: 10/11 0701 - 10/12 0700 In: 3727.4 [P.O.:840; I.V.:1072; TPN:1815.4] Out: 1400 [Urine:1250; Emesis/NG output:150] Intake/Output from this shift:    Labs:  Recent Labs  08/13/13 0610 08/14/13 0510  WBC 9.9 7.3  HGB 10.9* 10.4*  HCT 32.4* 31.4*  PLT 304 277     Recent Labs  08/13/13 0610 08/14/13 0510 08/15/13 0825  NA 138 136 136  K 3.3* 3.5 3.8  CL 104 105 103  CO2 24 22 23   GLUCOSE 119* 114* 107*  BUN 12 12 18   CREATININE 0.93 0.89 0.83  CALCIUM 8.7 8.7 9.3  MG 1.6 1.7  --   PHOS 4.0 4.4  --   PROT 6.3  --   --   ALBUMIN 3.1*  --   --   AST 9  --   --   ALT 8  --   --   ALKPHOS 50  --   --   BILITOT 0.2*  --   --   PREALBUMIN 13.3*  --   --   TRIG 94  --   --   Corrected calcium: 9.42 (10/10)  Estimated Creatinine Clearance: 89.4 ml/min (by C-G formula based on Cr of 0.83).    Recent Labs  08/14/13 1644 08/15/13 0006 08/15/13 0714  GLUCAP 102* 117* 137*    Medications:  Scheduled:  . bisacodyl  10 mg Rectal Daily  . budesonide-formoterol  2 puff Inhalation BID  . fluticasone  2 spray Each Nare Daily  . heparin  5,000 Units Subcutaneous Q8H  . insulin aspart  0-15 Units Subcutaneous Q8H  . ketorolac  30 mg Intravenous Q6H  . lip balm  1 application Topical BID  . LORazepam  2 mg  Intravenous QHS  . magic mouthwash  15 mL Oral QID  . metoCLOPramide (REGLAN) injection  10 mg Intravenous Q6H  . ondansetron  4 mg Intravenous Q6H  . pantoprazole (PROTONIX) IV  40 mg Intravenous Q12H   Infusions:  . dextrose 5 % and 0.45 % NaCl with KCl 20 mEq/L 20 mL/hr (08/14/13 1728)  . Marland KitchenTPN (CLINIMIX-E) Adult 75 mL/hr at 08/14/13 1717   And  . fat emulsion 250 mL (08/14/13 1718)    Insulin Requirements in the past 24 hours:  None - SSI moderate scale q8h ordered but patient is refusing  IVF: D5 1/2NS w/ 20KCl at 20 ml/hr  Current Nutrition:  Clear liquids, NG tube removed 10/11 pm Clinimix E5/15 at 75 ml/hr Lipids 20% at 10 ml/hr  Nutritional Goals:  Per RD 10/9: Kcal: 1650-1750  Protein: 85-95g  Fluid: 1.6-1.7L/day  Clinimix-E 5/15 at 75 mL/hr + Lipids 20% 7mL/hr daily will provide 90 grams protein and 1758 kcal per day  Assessment: 42 y/o F s/p enterolysis of adhesions 07/29/13, presented to ED 08/08/13 with small bowel obstruction. Per CCS, defer surgery at this time. TNA initiated 10/9 pm.  Glucose: at goal <150, but patient is refusing some checks  Electrolytes: K wnl after replacement 10/10, all others wnl  LFTs: wnl (10/10)  TGs: normal, 94 (10/10)  Prealbumin: low, 13.3 (10/10)  TPN Access: Double lumen PICC TPN day#: 4  Plan: At 1800 today:  Continue Clinimix E 5/15 at goal 75 ml/hr  Continue 20% fat emulsion at 77ml/hr  TNA to contain standard multivitamins and trace elements daily  Defer decision to stop CBGs to MD, since this is an important part of monitoring TNA patient's.   TNA lab panels on Mondays & Thursdays.  F/u daily for TNA tolerance, diet advancement  Loralee Pacas, PharmD, BCPS Pager: [phone] 08/15/2013,9:07 AM

## 2013-08-16 ENCOUNTER — Encounter (HOSPITAL_COMMUNITY)
Admission: EM | Disposition: A | Discharge status: Home / Self Care | Payer: Self-pay | Source: Home / Self Care | Attending: Surgery

## 2013-08-16 ENCOUNTER — Encounter (HOSPITAL_COMMUNITY): Payer: Managed Care, Other (non HMO) | Admitting: Anesthesiology

## 2013-08-16 ENCOUNTER — Encounter (HOSPITAL_COMMUNITY): Payer: Self-pay | Admitting: *Deleted

## 2013-08-16 ENCOUNTER — Inpatient Hospital Stay (HOSPITAL_COMMUNITY): Payer: Managed Care, Other (non HMO) | Admitting: Anesthesiology

## 2013-08-16 DIAGNOSIS — K660 Peritoneal adhesions (postprocedural) (postinfection): Secondary | ICD-10-CM | POA: Diagnosis not present

## 2013-08-16 DIAGNOSIS — K56609 Unspecified intestinal obstruction, unspecified as to partial versus complete obstruction: Secondary | ICD-10-CM | POA: Diagnosis not present

## 2013-08-16 DIAGNOSIS — R109 Unspecified abdominal pain: Secondary | ICD-10-CM | POA: Diagnosis not present

## 2013-08-16 HISTORY — PX: LAPAROTOMY: SHX154

## 2013-08-16 HISTORY — PX: LAPAROSCOPY: SHX197

## 2013-08-16 LAB — DIFFERENTIAL
Basophils Absolute: 0 10*3/uL (ref 0.0–0.1)
Basophils Relative: 1 % (ref 0–1)
Lymphocytes Relative: 24 % (ref 12–46)
Monocytes Absolute: 0.7 10*3/uL (ref 0.1–1.0)
Neutro Abs: 3.9 10*3/uL (ref 1.7–7.7)
Neutrophils Relative %: 58 % (ref 43–77)

## 2013-08-16 LAB — GLUCOSE, CAPILLARY
Glucose-Capillary: 112 mg/dL — ABNORMAL HIGH (ref 70–99)
Glucose-Capillary: 116 mg/dL — ABNORMAL HIGH (ref 70–99)

## 2013-08-16 LAB — PHOSPHORUS: Phosphorus: 4.5 mg/dL (ref 2.3–4.6)

## 2013-08-16 LAB — COMPREHENSIVE METABOLIC PANEL
AST: 13 U/L (ref 0–37)
BUN: 20 mg/dL (ref 6–23)
CO2: 24 mEq/L (ref 19–32)
Calcium: 9.4 mg/dL (ref 8.4–10.5)
Creatinine, Ser: 0.84 mg/dL (ref 0.50–1.10)
GFR calc Af Amer: >90 mL/min (ref >90)
GFR calc non Af Amer: 83 mL/min — ABNORMAL LOW (ref >90)
Sodium: 139 mEq/L (ref 135–145)
Total Bilirubin: 0.2 mg/dL — ABNORMAL LOW (ref 0.3–1.2)
Total Protein: 6.7 g/dL (ref 6.0–8.3)

## 2013-08-16 LAB — CBC
HCT: 31.9 % — ABNORMAL LOW (ref 36.0–46.0)
MCHC: 33.2 g/dL (ref 30.0–36.0)
MCV: 92.5 fL (ref 78.0–100.0)
Platelets: 314 10*3/uL (ref 150–400)
RDW: 13.1 % (ref 11.5–15.5)
WBC: 6.8 10*3/uL (ref 4.0–10.5)

## 2013-08-16 LAB — TRIGLYCERIDES: Triglycerides: 112 mg/dL (ref <150)

## 2013-08-16 LAB — PREALBUMIN: Prealbumin: 18.7 mg/dL (ref 17.0–34.0)

## 2013-08-16 LAB — MAGNESIUM: Magnesium: 2 mg/dL (ref 1.5–2.5)

## 2013-08-16 SURGERY — LAPAROSCOPY, DIAGNOSTIC
Anesthesia: General | Site: Abdomen | Wound class: Contaminated

## 2013-08-16 MED ORDER — NEOSTIGMINE METHYLSULFATE 1 MG/ML IJ SOLN
INTRAMUSCULAR | Status: DC | PRN
  Administered 2013-08-16: 1 mg via INTRAVENOUS

## 2013-08-16 MED ORDER — TRACE MINERALS CR-CU-F-FE-I-MN-MO-SE-ZN IV SOLN
INTRAVENOUS | Status: AC
  Administered 2013-08-16: 75 mL/h via INTRAVENOUS
  Administered 2013-08-16: 21:00:00 via INTRAVENOUS
  Filled 2013-08-16: qty 2000

## 2013-08-16 MED ORDER — BUPIVACAINE-EPINEPHRINE (PF) 0.25% -1:200000 IJ SOLN
INTRAMUSCULAR | Status: DC | PRN
  Administered 2013-08-16: 50 mL

## 2013-08-16 MED ORDER — HYDROMORPHONE HCL PF 1 MG/ML IJ SOLN
0.2500 mg | INTRAMUSCULAR | Status: DC | PRN
  Administered 2013-08-16 (×2): 0.5 mg via INTRAVENOUS

## 2013-08-16 MED ORDER — DEXAMETHASONE SODIUM PHOSPHATE 4 MG/ML IJ SOLN
INTRAMUSCULAR | Status: DC | PRN
  Administered 2013-08-16: 10 mg via INTRAVENOUS

## 2013-08-16 MED ORDER — GENTAMICIN IN SALINE 1-0.9 MG/ML-% IV SOLN: 100.0000 mg | INTRAVENOUS | Status: DC

## 2013-08-16 MED ORDER — PROMETHAZINE HCL 25 MG/ML IJ SOLN: 6.2500 mg | INTRAMUSCULAR | Status: DC | PRN

## 2013-08-16 MED ORDER — CLINDAMYCIN PHOSPHATE 900 MG/50ML IV SOLN
INTRAVENOUS | Status: AC
  Filled 2013-08-16: qty 50

## 2013-08-16 MED ORDER — BUPIVACAINE-EPINEPHRINE 0.25% -1:200000 IJ SOLN
INTRAMUSCULAR | Status: AC
  Filled 2013-08-16: qty 1

## 2013-08-16 MED ORDER — BUPIVACAINE 0.25 % ON-Q PUMP SINGLE CATH 300ML
INJECTION | Status: DC | PRN
  Administered 2013-08-16: 300 mL

## 2013-08-16 MED ORDER — LACTATED RINGERS IV SOLN
INTRAVENOUS | Status: DC | PRN
  Administered 2013-08-16 (×2): via INTRAVENOUS

## 2013-08-16 MED ORDER — 0.9 % SODIUM CHLORIDE (POUR BTL) OPTIME
TOPICAL | Status: DC | PRN
  Administered 2013-08-16: 4000 mL

## 2013-08-16 MED ORDER — KETOROLAC TROMETHAMINE 30 MG/ML IJ SOLN
INTRAMUSCULAR | Status: DC | PRN
  Administered 2013-08-16: 30 mg via INTRAVENOUS

## 2013-08-16 MED ORDER — PROPOFOL 10 MG/ML IV BOLUS
INTRAVENOUS | Status: DC | PRN
  Administered 2013-08-16: 150 mg via INTRAVENOUS

## 2013-08-16 MED ORDER — GENTAMICIN SULFATE 40 MG/ML IJ SOLN
5.0000 mg/kg | INTRAVENOUS | Status: AC
  Administered 2013-08-16: 374 mg via INTRAVENOUS
  Filled 2013-08-16: qty 9.25

## 2013-08-16 MED ORDER — HYDROMORPHONE HCL PF 1 MG/ML IJ SOLN
INTRAMUSCULAR | Status: AC
  Filled 2013-08-16: qty 1

## 2013-08-16 MED ORDER — ESMOLOL HCL 10 MG/ML IV SOLN
INTRAVENOUS | Status: DC | PRN
  Administered 2013-08-16: 20 mg via INTRAVENOUS

## 2013-08-16 MED ORDER — METOCLOPRAMIDE HCL 5 MG/ML IJ SOLN
INTRAMUSCULAR | Status: DC | PRN
  Administered 2013-08-16: 10 mg via INTRAVENOUS

## 2013-08-16 MED ORDER — MAGIC MOUTHWASH
15.0000 mL | Freq: Four times a day (QID) | ORAL | Status: DC | PRN
  Filled 2013-08-16: qty 15

## 2013-08-16 MED ORDER — METOCLOPRAMIDE HCL 5 MG/ML IJ SOLN: 5.0000 mg | Freq: Four times a day (QID) | INTRAMUSCULAR | Status: DC | PRN

## 2013-08-16 MED ORDER — LACTATED RINGERS IV SOLN: INTRAVENOUS | Status: DC

## 2013-08-16 MED ORDER — METHOCARBAMOL 100 MG/ML IJ SOLN
500.0000 mg | Freq: Three times a day (TID) | INTRAVENOUS | Status: AC
  Administered 2013-08-17 – 2013-08-19 (×9): 500 mg via INTRAVENOUS
  Filled 2013-08-16 (×9): qty 5

## 2013-08-16 MED ORDER — CLINDAMYCIN PHOSPHATE 600 MG/50ML IV SOLN
600.0000 mg | INTRAVENOUS | Status: DC
  Filled 2013-08-16 (×2): qty 50

## 2013-08-16 MED ORDER — BUPIVACAINE 0.25 % ON-Q PUMP DUAL CATH 300 ML
300.0000 mL | INJECTION | Status: DC
  Filled 2013-08-16: qty 300

## 2013-08-16 MED ORDER — FENTANYL CITRATE 0.05 MG/ML IJ SOLN
INTRAMUSCULAR | Status: DC | PRN
  Administered 2013-08-16 (×4): 50 ug via INTRAVENOUS
  Administered 2013-08-16: 100 ug via INTRAVENOUS
  Administered 2013-08-16 (×3): 50 ug via INTRAVENOUS
  Administered 2013-08-16: 100 ug via INTRAVENOUS
  Administered 2013-08-16: 50 ug via INTRAVENOUS

## 2013-08-16 MED ORDER — ONDANSETRON HCL 4 MG/2ML IJ SOLN
INTRAMUSCULAR | Status: DC | PRN
  Administered 2013-08-16: 4 mg via INTRAMUSCULAR

## 2013-08-16 MED ORDER — MORPHINE SULFATE 2 MG/ML IJ SOLN
2.0000 mg | INTRAMUSCULAR | Status: DC | PRN
  Administered 2013-08-17 (×3): 4 mg via INTRAVENOUS
  Filled 2013-08-16 (×3): qty 2

## 2013-08-16 MED ORDER — LORAZEPAM 2 MG/ML IJ SOLN
0.5000 mg | Freq: Three times a day (TID) | INTRAMUSCULAR | Status: DC | PRN
Start: 2013-08-16 — End: 2013-08-26
  Administered 2013-08-16 – 2013-08-26 (×14): 1 mg via INTRAVENOUS
  Filled 2013-08-16 (×14): qty 1

## 2013-08-16 MED ORDER — CLINDAMYCIN PHOSPHATE 900 MG/50ML IV SOLN
INTRAVENOUS | Status: DC | PRN
  Administered 2013-08-16: 900 mg via INTRAVENOUS

## 2013-08-16 MED ORDER — FAT EMULSION 20 % IV EMUL
250.0000 mL | INTRAVENOUS | Status: AC
  Administered 2013-08-16: 250 mL via INTRAVENOUS
  Filled 2013-08-16: qty 250

## 2013-08-16 MED ORDER — MORPHINE SULFATE 2 MG/ML IJ SOLN
2.0000 mg | INTRAMUSCULAR | Status: DC | PRN
  Administered 2013-08-16 (×2): 4 mg via INTRAVENOUS
  Filled 2013-08-16 (×2): qty 2

## 2013-08-16 MED ORDER — HYDROMORPHONE HCL PF 1 MG/ML IJ SOLN
INTRAMUSCULAR | Status: DC | PRN
  Administered 2013-08-16: 2 mg via INTRAVENOUS

## 2013-08-16 MED ORDER — KETAMINE HCL 10 MG/ML IJ SOLN
INTRAMUSCULAR | Status: DC | PRN
  Administered 2013-08-16: 100 mg via INTRAVENOUS

## 2013-08-16 MED ORDER — ROCURONIUM BROMIDE 100 MG/10ML IV SOLN
INTRAVENOUS | Status: DC | PRN
  Administered 2013-08-16: 10 mg via INTRAVENOUS
  Administered 2013-08-16: 30 mg via INTRAVENOUS
  Administered 2013-08-16: 10 mg via INTRAVENOUS

## 2013-08-16 MED ORDER — ONDANSETRON HCL 4 MG/2ML IJ SOLN
4.0000 mg | Freq: Four times a day (QID) | INTRAMUSCULAR | Status: DC
  Administered 2013-08-16 – 2013-08-17 (×3): 4 mg via INTRAVENOUS
  Filled 2013-08-16 (×3): qty 2

## 2013-08-16 MED ORDER — SUCCINYLCHOLINE CHLORIDE 20 MG/ML IJ SOLN
INTRAMUSCULAR | Status: DC | PRN
  Administered 2013-08-16: 100 mg via INTRAVENOUS

## 2013-08-16 MED ORDER — MIDAZOLAM HCL 5 MG/5ML IJ SOLN
INTRAMUSCULAR | Status: DC | PRN
  Administered 2013-08-16: 2 mg via INTRAVENOUS

## 2013-08-16 SURGICAL SUPPLY — 82 items
BLADE EXTENDED COATED 6.5IN (ELECTRODE) IMPLANT
BLADE HEX COATED 2.75 (ELECTRODE) ×4 IMPLANT
BLADE SURG SZ10 CARB STEEL (BLADE) ×6 IMPLANT
BRIEF STRETCH FOR OB PAD LRG (UNDERPADS AND DIAPERS) ×2 IMPLANT
CANISTER SUCTION 2500CC (MISCELLANEOUS) ×4 IMPLANT
CATH FOLEY LATEX FREE 16FR (CATHETERS) ×2 IMPLANT
CATH KIT ON Q 7.5IN SLV (PAIN MANAGEMENT) IMPLANT
CATH KIT ON-Q SILVERSOAK 7.5IN (CATHETERS) ×4 IMPLANT
CHLORAPREP W/TINT 26ML (MISCELLANEOUS) ×4 IMPLANT
COUNTER NEEDLE 20 DBL MAG RED (NEEDLE) ×2 IMPLANT
COVER MAYO STAND STRL (DRAPES) ×2 IMPLANT
DECANTER SPIKE VIAL GLASS SM (MISCELLANEOUS) ×2 IMPLANT
DRAIN CHANNEL 19F RND (DRAIN) IMPLANT
DRAPE LAPAROSCOPIC ABDOMINAL (DRAPES) ×2 IMPLANT
DRAPE LG THREE QUARTER DISP (DRAPES) IMPLANT
DRAPE UTILITY XL STRL (DRAPES) ×4 IMPLANT
DRAPE WARM FLUID 44X44 (DRAPE) ×2 IMPLANT
DRSG OPSITE POSTOP 4X10 (GAUZE/BANDAGES/DRESSINGS) IMPLANT
DRSG OPSITE POSTOP 4X6 (GAUZE/BANDAGES/DRESSINGS) IMPLANT
DRSG OPSITE POSTOP 4X8 (GAUZE/BANDAGES/DRESSINGS) ×2 IMPLANT
DRSG TEGADERM 2-3/8X2-3/4 SM (GAUZE/BANDAGES/DRESSINGS) ×8 IMPLANT
DRSG TEGADERM 4X4.75 (GAUZE/BANDAGES/DRESSINGS) IMPLANT
ELECT REM PT RETURN 9FT ADLT (ELECTROSURGICAL) ×2
ELECTRODE REM PT RTRN 9FT ADLT (ELECTROSURGICAL) ×1 IMPLANT
GLOVE BIOGEL PI IND STRL 7.0 (GLOVE) ×1 IMPLANT
GLOVE BIOGEL PI IND STRL 8 (GLOVE) ×1 IMPLANT
GLOVE BIOGEL PI INDICATOR 7.0 (GLOVE) ×1
GLOVE BIOGEL PI INDICATOR 8 (GLOVE) ×1
GLOVE ECLIPSE 8.0 STRL XLNG CF (GLOVE) IMPLANT
GLOVE INDICATOR 8.0 STRL GRN (GLOVE) IMPLANT
GLOVE SURG SS PI 6.5 STRL IVOR (GLOVE) ×8 IMPLANT
GLOVE SURG SS PI 8.0 STRL IVOR (GLOVE) ×4 IMPLANT
GOWN STRL REIN XL XLG (GOWN DISPOSABLE) ×8 IMPLANT
KIT BASIN OR (CUSTOM PROCEDURE TRAY) ×2 IMPLANT
LEGGING LITHOTOMY PAIR STRL (DRAPES) IMPLANT
LIGASURE IMPACT 36 18CM CVD LR (INSTRUMENTS) IMPLANT
NS IRRIG 1000ML POUR BTL (IV SOLUTION) ×8 IMPLANT
PACK GENERAL/GYN (CUSTOM PROCEDURE TRAY) IMPLANT
PENCIL BUTTON HOLSTER BLD 10FT (ELECTRODE) ×2 IMPLANT
RELOAD PROXIMATE 75MM BLUE (ENDOMECHANICALS) ×2 IMPLANT
RELOAD PROXIMATE TA60MM BLUE (ENDOMECHANICALS) ×2 IMPLANT
RETRACTOR WND ALEXIS 25 LRG (MISCELLANEOUS) ×1 IMPLANT
RTRCTR WOUND ALEXIS 25CM LRG (MISCELLANEOUS) ×2
SCISSORS LAP 5X35 DISP (ENDOMECHANICALS) ×2 IMPLANT
SEALER TISSUE G2 CVD JAW 35 (ENDOMECHANICALS) IMPLANT
SEALER TISSUE G2 CVD JAW 45CM (ENDOMECHANICALS)
SEALER TISSUE G2 STRG ARTC 35C (ENDOMECHANICALS) IMPLANT
SEPRAFILM PROCEDURAL PACK 3X5 (MISCELLANEOUS) ×2 IMPLANT
SET IRRIG TUBING LAPAROSCOPIC (IRRIGATION / IRRIGATOR) IMPLANT
SLEEVE XCEL OPT CAN 5 100 (ENDOMECHANICALS) ×4 IMPLANT
SPONGE GAUZE 4X4 12PLY (GAUZE/BANDAGES/DRESSINGS) IMPLANT
SPONGE LAP 18X18 X RAY DECT (DISPOSABLE) ×4 IMPLANT
STAPLER GUN LINEAR PROX 60 (STAPLE) ×2 IMPLANT
STAPLER PROXIMATE 75MM BLUE (STAPLE) ×2 IMPLANT
STAPLER VISISTAT 35W (STAPLE) IMPLANT
SUCTION POOLE TIP (SUCTIONS) ×2 IMPLANT
SUT MNCRL AB 4-0 PS2 18 (SUTURE) ×6 IMPLANT
SUT NOV 1 T60/GS (SUTURE) IMPLANT
SUT NOVA NAB DX-16 0-1 5-0 T12 (SUTURE) IMPLANT
SUT NOVA T20/GS 25 (SUTURE) IMPLANT
SUT PDS AB 1 CTX 36 (SUTURE) ×4 IMPLANT
SUT SILK 2 0 (SUTURE) ×2
SUT SILK 2 0 SH CR/8 (SUTURE) ×2 IMPLANT
SUT SILK 2 0SH CR/8 30 (SUTURE) ×2 IMPLANT
SUT SILK 2-0 18XBRD TIE 12 (SUTURE) ×2 IMPLANT
SUT SILK 3 0 (SUTURE)
SUT SILK 3 0 SH CR/8 (SUTURE) IMPLANT
SUT SILK 3-0 18XBRD TIE 12 (SUTURE) IMPLANT
SUT VIC AB 2-0 SH 18 (SUTURE) IMPLANT
SUT VIC AB 3-0 SH 18 (SUTURE) IMPLANT
SUT VICRYL 2 0 18  UND BR (SUTURE)
SUT VICRYL 2 0 18 UND BR (SUTURE) IMPLANT
TOWEL OR 17X26 10 PK STRL BLUE (TOWEL DISPOSABLE) ×2 IMPLANT
TOWEL OR NON WOVEN STRL DISP B (DISPOSABLE) ×2 IMPLANT
TRAY FOLEY CATH 14FRSI W/METER (CATHETERS) IMPLANT
TRAY LAP CHOLE (CUSTOM PROCEDURE TRAY) ×2 IMPLANT
TROCAR BLADELESS OPT 5 100 (ENDOMECHANICALS) ×2 IMPLANT
TROCAR XCEL NON-BLD 11X100MML (ENDOMECHANICALS) IMPLANT
TUBING INSUFFLATION 10FT LAP (TUBING) ×2 IMPLANT
TUNNELER SHEATH ON-Q 16GX12 DP (PAIN MANAGEMENT) ×2 IMPLANT
YANKAUER SUCT BULB TIP 10FT TU (MISCELLANEOUS) ×2 IMPLANT
YANKAUER SUCT BULB TIP NO VENT (SUCTIONS) ×2 IMPLANT

## 2013-08-16 NOTE — Progress Notes (Signed)
PARENTERAL NUTRITION CONSULT NOTE - FOLLOW UP  Pharmacy Consult for TNA Indication: Bowel obstruction  Allergies  Allergen Reactions  . Neurontin [Gabapentin] Other (See Comments)    Other reaction(s): Mental Status Changes (intolerance) hallucinations Sleep walking  . Amoxicillin Hives  . Penicillins Hives  . Latex Rash   Patient Measurements: Height: 5\' 6"  (167.6 cm) Weight: 164 lb 14.5 oz (74.8 kg) IBW/kg (Calculated) : 59.3 Adjusted Body Weight: 64kg Usual Weight: 75kg  Vital Signs: Temp: 98.7 F (37.1 C) (10/13 0500) Temp src: Oral (10/13 0500) BP: 130/78 mmHg (10/13 0500) Pulse Rate: 90 (10/13 0500) Intake/Output from previous day: 10/12 0701 - 10/13 0700 In: 3120 [P.O.:600; I.V.:480; TPN:2040] Out: 1600 [Urine:1600] Intake/Output from this shift:    Labs:  Recent Labs  08/14/13 0510 08/16/13 0510  WBC 7.3 6.8  HGB 10.4* 10.6*  HCT 31.4* 31.9*  PLT 277 314    Recent Labs  08/14/13 0510 08/15/13 0825 08/16/13 0510  NA 136 136 139  K 3.5 3.8 3.5  CL 105 103 103  CO2 22 23 24   GLUCOSE 114* 107* 118*  BUN 12 18 20   CREATININE 0.89 0.83 0.84  CALCIUM 8.7 9.3 9.4  MG 1.7  --  2.0  PHOS 4.4  --  4.5  PROT  --   --  6.7  ALBUMIN  --   --  3.3*  AST  --   --  13  ALT  --   --  12  ALKPHOS  --   --  47  BILITOT  --   --  0.2*  TRIG  --   --  112  Corrected calcium: 9.42 (10/10)  Estimated Creatinine Clearance: 88.4 ml/min (by C-G formula based on Cr of 0.84).   Recent Labs  08/15/13 0714 08/15/13 1706 08/15/13 2352  GLUCAP 137* 104* 112*   Medications:  Scheduled:  . bisacodyl  10 mg Rectal Daily  . budesonide-formoterol  2 puff Inhalation BID  . fluticasone  2 spray Each Nare Daily  . heparin  5,000 Units Subcutaneous Q8H  . insulin aspart  0-15 Units Subcutaneous Q8H  . ketorolac  30 mg Intravenous Q6H  . lip balm  1 application Topical BID  . magic mouthwash  15 mL Oral QID  . metoCLOPramide (REGLAN) injection  10 mg Intravenous  Q6H  . montelukast  10 mg Oral QHS  . olopatadine  1 drop Both Eyes BID  . ondansetron  4 mg Intravenous Q6H  . saccharomyces boulardii  250 mg Oral BID  . topiramate  50 mg Oral QHS   Infusions:  . dextrose 5 % and 0.45 % NaCl with KCl 20 mEq/L 20 mL/hr (08/14/13 1728)  . Marland KitchenTPN (CLINIMIX-E) Adult 75 mL/hr at 08/15/13 1718   And  . fat emulsion 250 mL (08/15/13 1718)  . Marland KitchenTPN (CLINIMIX-E) Adult     And  . fat emulsion     Insulin Requirements in the past 24 hours:  None - SSI moderate scale q8h ordered but patient is refusing CBG < 150 mg/dl  IVF: D5 0/8MV w/ 78ION at 20 ml/hr  Current Nutrition:  NPO- pt refuses repeat NG placement, NG tube removed 10/11 pm Clinimix E5/15 at 75 ml/hr Lipids 20% at 10 ml/hr  Nutritional Goals:  Per RD 10/9: Kcal: 1650-1750  Protein: 85-95g  Fluid: 1.6-1.7L/day  Clinimix-E 5/15 at 75 mL/hr + Lipids 20% 94mL/hr daily will provide 90 grams protein and 1758 kcal per day  Assessment: 42 y/o F s/p  enterolysis of adhesions 07/29/13, Jane English, Jane surgery at this time. TNA initiated 10/9 pm.   Glucose: at goal <150, but patient is refusing some checks  Electrolytes: wnl, K replacement yesterday. Phos at high end of range  LFTs: wnl (10/13)  TGs: normal, 94 (10/10), 112 (10/13)  Prealbumin: low, 13.3 (10/10), pending (10/13)  TPN Access: Double lumen PICC TPN day#: 5  Plan: At 1800 today:  Continue Clinimix E 5/15 at goal 75 ml/hr  Continue 20% fat emulsion at 66ml/hr  TNA to contain standard multivitamins and trace elements daily  Jane decision to stop CBGs to MD, since this is an important part of monitoring TNA patient's.   TNA lab panels on Mondays & Thursdays.  F/u daily for TNA tolerance, diet advancement  Expl lap today  Otho Bellows PharmD Pager [phone] 08/16/2013, 11:09 AM

## 2013-08-16 NOTE — Preoperative (Addendum)
Beta Blockers   Reason not to administer Beta Blockers:Not Applicable, not on home BB 

## 2013-08-16 NOTE — Transfer of Care (Signed)
Immediate Anesthesia Transfer of Care Note  Patient: Jane English  Procedure(s) Performed: Procedure(s): LAPAROSCOPY DIAGNOSTIC/LYSIS OF ADHESIONS (N/A) EXPLORATORY LAPAROTOMY/SMALL BOWEL RESECTION (JEJUNUM) (N/A)  Patient Location: PACU  Anesthesia Type:General  Level of Consciousness: awake, oriented, patient cooperative, lethargic and responds to stimulation  Airway & Oxygen Therapy: Patient Spontanous Breathing and Patient connected to face mask oxygen  Post-op Assessment: Report given to PACU RN, Post -op Vital signs reviewed and stable and Patient moving all extremities  Post vital signs: Reviewed and stable  Complications: No apparent anesthesia complications

## 2013-08-16 NOTE — Progress Notes (Signed)
Patient in holding area with husband.  Tearful.  Nervous.  Concern that laparoscopic approach caused the adhesions.  Again went over the indications of diagnostic laparoscopy to minimize adhesions.  I noted there is a chance of conversion to open in an urgent setting with dilation, but I like to try and minimize the risk of new adhesions with the camera if it is safe.  I stressed her I will try to be safe and thorough.  She expressed understanding and appreciation.

## 2013-08-16 NOTE — Anesthesia Preprocedure Evaluation (Signed)
Anesthesia Evaluation  Patient identified by MRN, date of birth, ID band Patient awake    Reviewed: Allergy & Precautions, H&P , NPO status , [name] Chart, lab work & pertinent test results  Airway Mallampati: II TM Distance: >3 FB Neck ROM: full    Dental no notable dental hx. (+) Teeth Intact and Dental Advisory Given   Pulmonary shortness of breath and with exertion, asthma ,  Moderate to severe asthma breath sounds clear to auscultation  Pulmonary exam normal       Cardiovascular hypertension, Pt. on medications DVT Rhythm:regular Rate:Normal     Neuro/Psych  Headaches, Anxiety Depression Psychotic episode with hallucinationsCervical fusion  Neuromuscular disease negative neurological ROS  negative psych ROS   GI/Hepatic negative GI ROS, Neg liver ROS, GERD-  Medicated and Controlled,  Endo/Other  diabetes, Well Controlled, Type 2, Oral Hypoglycemic AgentsMorbid obesity  Renal/GU negative Renal ROS  negative genitourinary   Musculoskeletal   Abdominal   Peds  Hematology negative hematology ROS (+)   Anesthesia Other Findings   Reproductive/Obstetrics negative OB ROS                           Anesthesia Physical Anesthesia Plan  ASA: III and emergent  Anesthesia Plan: General   Post-op Pain Management:    Induction: Rapid sequence, Cricoid pressure planned and Intravenous  Airway Management Planned: Oral ETT  Additional Equipment:   Intra-op Plan:   Post-operative Plan: Extubation in OR  Informed Consent: I have reviewed the patients History and Physical, chart, labs and discussed the procedure including the risks, benefits and alternatives for the proposed anesthesia with the patient or authorized representative who has indicated his/her understanding and acceptance.   Dental advisory given  Plan Discussed with: CRNA  Anesthesia Plan Comments:         Anesthesia  Quick Evaluation

## 2013-08-16 NOTE — Op Note (Addendum)
08/08/2013 - 08/16/2013  8:46 PM  PATIENT:  Jane English  42 y.o. female  Patient Care Team: Kerri Perches, MD as PCP - General Ardeth Sportsman, MD as Consulting Physician (General Surgery) Karn Cassis, MD as Consulting Physician (Neurosurgery) Beverley Fiedler, MD as Consulting Physician (Gastroenterology)  PRE-OPERATIVE DIAGNOSIS:  chronic abdominal pain adhesions  POST-OPERATIVE DIAGNOSIS:  chronic abdominal pain, SMALL BOWEL OBSTRUCTION with intestinal mesentery inflammatory mass  PROCEDURE:  Procedure(s): LAPAROSCOPY DIAGNOSTIC LYSIS OF ADHESIONS SMALL BOWEL RESECTION (JEJUNUM)  SURGEON:  Surgeon(s): Ardeth Sportsman, MD  ASSISTANT: Chelsea Aus, PA student   ANESTHESIA:   local and general  EBL:  Total I/O In: 1000 [I.V.:1000] Out: 1825 [Urine:150; Other:1500; Blood:175]  Delay start of Pharmacological VTE agent (>24hrs) due to surgical blood loss or risk of bleeding:  no  DRAINS: none   SPECIMEN:  Source of Specimen:  Jejunum  DISPOSITION OF SPECIMEN:  PATHOLOGY  COUNTS:  YES  PLAN OF CARE: Admit to inpatient   PATIENT DISPOSITION:  PACU - hemodynamically stable.  INDICATION: Woman with chronic abdominal and back pain.  Had lysis lesions for severe pain with moderate lesions noted.  Return with early post a bowel obstruction.  Mild improvement and then recurrent symptoms by hospital day #8.  I recommended laparoscopic possible open exploration:  The anatomy & physiology of the digestive tract was discussed.  The pathophysiology of intestinal obstruction was discussed.  Natural history risks without surgery was discussed.   I feel the patient has failed non-operative therapies.  The risks of no intervention will lead to serious problems such as necrosis, perforation, dehydration, etc. that outweigh the operative risks; therefore, I recommended abdominal exploration to diagnose & treat the source of the problem.  Laparoscopic & open techniques were discussed.    I expressed a good likelihood that surgery will treat the problem.  Risks such as bleeding, infection, abscess, leak, reoperation, bowel resection, possible ostomy, hernia, heart attack, death, and other risks were discussed.   I noted a good likelihood this will help address the problem.  Goals of post-operative recovery were discussed as well.  We will work to minimize complications. Questions were answered.  The patient expresses understanding & wishes to proceed with surgery.   OR FINDINGS: Dense inflammatory walleyed of jejunal mesentery involving about 30% of the small intestine.  Obvious point of transition.  Ileum normal.  Proximal jejunum dilated but normal.  No adhesions elsewhere in the abdomen.  Colon and other viscera normal.  250cm of small intestine remain from pylorus to ileocecal valve.  DESCRIPTION:   Informed consent was confirmed.  The patient underwent general anaesthesia without difficulty.  The patient was positioned appropriately.  VTE prevention in place.  The patient's abdomen was clipped, prepped, & draped in a sterile fashion.  Surgical timeout confirmed our plan.  Gastric tube was placed with immediate return of 1500 mL fluid.  The patient was positioned in reverse Trendelenburg.  Abdominal entry with a 5mm laparoscopic port was gained using optical entry technique in the left upper abdomen.  Entry was clean.  I induced carbon dioxide insufflation.  Camera inspection revealed no injury.  Extra ports were carefully placed under direct laparoscopic visualization in the left upper quadrant.  I could see a dense wad of omentum adherent to the infraumbilical midline.  This was below all port sites.  No adhesions elsewhere.  No other interloop adhesions as before.  No adhesions to colon nor adnexa as before.  No  adhesions to the port sites.    I began to sharply dissect the mass off the anterior abdominal wall.  I could free the greater omentum off the anterior abdominal wall  with cold  scissors.  However, there is a dense adhesions of small bowel mesentery in a large mass.  This was the point of obstruction.  I could not safely free them off each other.  I therefore converted to an open incision.  I made a periumbilical midline incision.  I placed a wound protector.  I freed the greater omentum off this intestinal mass using cautery and sharp dissection.  I eviscerated the small bowel.  I could find the decompressed ileum and run it to the ileocecal region.  I could find dilated jejunum and run that up to the ligament of Treitz.  The central mass of small intestine and very dense fibrous adhesion with contractures within the mesentery.  Not really serosal adhesions.  There was no evidence of perforation.  There is no evidence of abscess.  There is no evidence of any ischemia or necrosis.  I attempted to try and lyse adhesions and free off the interloop mesentery adhesions.  However they were like concrete and very hard.  Because I could not free it up, I decided to resect it.  I did a side-to-side anastomosis of jejunum to ileum.  75 side-to-side stapling.  Then transected & closed the common defect with a TX-60 stapler.  The dilated jejunum proximal end at the anastomosis became very ischemic.  I therefore redid the anastomosis in a similar fashion.  I took the intervening mesentery with clamps and 2-0 silk ties.  The remaining mesentery was noninflamed and normal.  I closed the mesenteric defect with 0 silk ties and some interrupted silk stitches.  The staple lines were covered in the mesenteric closure.  I ran the small bowel.  I irrigated the abdomen with 4 L of saline.  Hemostasis was excellent.  I reinspected the anastomosis and it was viable and laid well.  I ran the small bowel from the ileocecal region to the ligament of Treitz, allowing it to fall down normally.  No defect or injury seen.  The greater omentum easily covered the peritoneal cavity down to the pelvis.  I  placed Seprafilm patches and in all quadrants and down the pelvis to minimize the reformation of adhesions.  I placed an On-Q catheter sheaths along the costal ridges.  I closed the fascia using running 0 PDS stitch.  I closed skin using 4-0 Monocryl stitch.  Port sites closed with 4-0 Monocryl stitch.  Sterile dressing applied.  On-Q catheters placed & sheaths peeled away.  The left one leaked to omuch so I removed it.  I left the right in place.  The patient is extubated.  I discussed operative findings and post op care to the patient's husband.  Questions are answered.  He expressed understanding and appreciation.

## 2013-08-16 NOTE — Progress Notes (Signed)
Jane English [id number] [birth year]  CARE TEAM:  PCP: Syliva Overman, MD  Outpatient Care Team: Patient Care Team: Kerri Perches, MD as PCP - General Ardeth Sportsman, MD as Consulting Physician (General Surgery) Karn Cassis, MD as Consulting Physician (Neurosurgery) Beverley Fiedler, MD as Consulting Physician (Gastroenterology)  Inpatient Treatment Team: Treatment Team: Attending Provider: Ardeth Sportsman, MD; Technician: Vella Raring, NT; Registered Nurse: Tristan Schroeder, RN; Consulting Physician: Palliative Doristine Johns; Dietitian: Lavena Bullion, RD; Registered Nurse: Jonathon Bellows, RN; Registered Nurse: Lattie Haw, RN; Technician: Oren Bracket [place], NT   Subjective:  Patient had "a really bad night, one of my worst" Retched a few times Tried to do few bites/sips Crampy abd pain, esp LLQ Walking/rocking in chair a lot Appreciated RNs efforts to help Does not want NGT again  Objective:  Vital signs:  Filed Vitals:   08/15/13 1350 08/15/13 1934 08/15/13 2130 08/16/13 0500  BP: 149/87  142/89 130/78  Pulse: 87  91 90  Temp: 98.1 F (36.7 C)  98.2 F (36.8 C) 98.7 F (37.1 C)  TempSrc: Oral  Oral Oral  Resp: 18  18 18   Height:      Weight:      SpO2: 100% 98% 100% 100%    Last BM Date: 08/14/13  Intake/Output   Yesterday:  10/12 0701 - 10/13 0700 In: 3120 [P.O.:600; I.V.:480; TPN:2040] Out: 1600 [Urine:1600] This shift:     Bowel function:  Flatus: NO   BM: NO  Drain:OUT  Physical Exam:  General: Pt awake/alert/oriented x4 in no acute distress but tired. Eyes: PERRL, normal EOM.  Sclera clear.  No icterus Neuro: CN II-XII intact w/o focal sensory/motor deficits. Lymph: No head/neck/groin lymphadenopathy Psych:  No delerium/psychosis/paranoia.  Depressed HENT: Normocephalic, Mucus membranes moist.  No thrush.  NGT in place Neck: Supple, No tracheal deviation Chest: No chest wall pain w good excursion CV:   Pulses intact.  Regular rhythm MS: Normal AROM mjr joints.  No obvious deformity Abdomen: Soft.  Obese.  Mildly distended.  Sore but no pain with cough or percussion.  No evidence of peritonitis.  No incarcerated hernias. Ext:  SCDs BLE.  No mjr edema.  No cyanosis Skin: No petechiae / purpura   Problem List:   Principal Problem:   SBO (small bowel obstruction) Active Problems:   Chronic pain syndrome   Anxiety state, unspecified   DEPRESSION, CHRONIC   Chronic constipation   PTSD (post-traumatic stress disorder)   Palliative care encounter   Abdominal pain, unspecified site  SURGERY 07/29/2013 POST-OPERATIVE DIAGNOSIS: recurrent abdominal pain,extensive adhesions   PROCEDURE: Procedure(s):  diagnostic laporoscopy  LYSIS OF ADHESIONS  serosal repair  SURGEON: Surgeon(s):  Ardeth Sportsman, MD  Kandis Cocking, MD - Asst      Assessment  Jane English  42 y.o. female       Partial small bowel obstruction in patient with chronic pain on chronic narcotics s/p significant adhesiolysis - worsening symptoms  Plan:   -OR exploration.  Try Dx lap w possible open exploration.  -Continue IV TNA  -Round-the-clock nausea control. Wean off ondansetron but keep metoclopramide round-the-clock for now.  Pain control.  Morphine PRN.  Ketorolac.  I do agree she needs to get back to her chronic pain specialist at [facility] at some point, but this is not a great time to radically change her regimen if possible.    -VTE prophylaxis- SCDs, etc  -mobilize as tolerated to  help recovery.  The more she can walk, the better - she has made improvements with that.  -?control depression.  Difficult with ileus/SBO.  See what palliative care has to offer.  D/w Palliative Care team  Restart Topamax for migraine prophylaxis.  Norvasc for hypertension.  Stop the diuretic and potassium for now.  See if we can simplify her med list  It has been a week & she is back to feeling worse.  Xrays w  evidence of partial SBO.  Hopefully I can find simple band/transition point to release.  She is going to be a challenge no matter what, but she refuses retrying the NGT only again and understands the risk of OR.  I have discussed this many times with the patient & her husband on this admission:  The anatomy & physiology of the digestive tract was discussed.  The pathophysiology of intestinal obstruction was discussed.  Natural history risks without surgery was discussed.   I feel the patient has failed non-operative therapies.  The risks of no intervention will lead to serious problems such as necrosis, perforation, dehydration, etc. that outweigh the operative risks; therefore, I recommended abdominal exploration to diagnose & treat the source of the problem.  Laparoscopic & open techniques were discussed.   I expressed a partial possible good likelihood that surgery will treat the problem.  Risks such as bleeding, infection, abscess, leak, reoperation, bowel resection, possible ostomy, hernia, heart attack, death, and other risks were discussed.   I noted a good likelihood this will help address the problem.  Goals of post-operative recovery were discussed as well.  We will work to minimize complications. Questions were answered.  The patient expresses understanding & wishes to proceed with surgery. She expressed understanding of my reasoning.  I sensed that she is more willing to take my advice.      Ardeth Sportsman, M.D., F.A.C.S. Gastrointestinal and Minimally Invasive Surgery [facility] [address] [place], [place] [address] [phone] Main / Paging   08/16/2013   Results:   Labs: Results for orders placed during the hospital encounter of 08/08/13 (from the past 48 hour(s))  GLUCOSE, CAPILLARY     Status: Abnormal   Collection Time    08/14/13  7:17 AM      Result Value Range   Glucose-Capillary 119 (*) 70 - 99 mg/dL  GLUCOSE, CAPILLARY      Status: Abnormal   Collection Time    08/14/13  4:44 PM      Result Value Range   Glucose-Capillary 102 (*) 70 - 99 mg/dL  GLUCOSE, CAPILLARY     Status: Abnormal   Collection Time    08/15/13 12:06 AM      Result Value Range   Glucose-Capillary 117 (*) 70 - 99 mg/dL   Comment 1 Documented in Chart     Comment 2 Notify RN    GLUCOSE, CAPILLARY     Status: Abnormal   Collection Time    08/15/13  7:14 AM      Result Value Range   Glucose-Capillary 137 (*) 70 - 99 mg/dL  BASIC METABOLIC PANEL     Status: Abnormal   Collection Time    08/15/13  8:25 AM      Result Value Range   Sodium 136  135 - 145 mEq/L   Potassium 3.8  3.5 - 5.1 mEq/L   Chloride 103  96 - 112 mEq/L   CO2 23  19 - 32  mEq/L   Glucose, Bld 107 (*) 70 - 99 mg/dL   BUN 18  6 - 23 mg/dL   Creatinine, Ser 1.61  0.50 - 1.10 mg/dL   Calcium 9.3  8.4 - 09.6 mg/dL   GFR calc non Af Amer 85 (*) >90 mL/min   GFR calc Af Amer >90  >90 mL/min   Comment: (NOTE)     The eGFR has been calculated using the CKD EPI equation.     This calculation has not been validated in all clinical situations.     eGFR's persistently <90 mL/min signify possible Chronic Kidney     Disease.  GLUCOSE, CAPILLARY     Status: Abnormal   Collection Time    08/15/13  5:06 PM      Result Value Range   Glucose-Capillary 104 (*) 70 - 99 mg/dL  GLUCOSE, CAPILLARY     Status: Abnormal   Collection Time    08/15/13 11:52 PM      Result Value Range   Glucose-Capillary 112 (*) 70 - 99 mg/dL  COMPREHENSIVE METABOLIC PANEL     Status: Abnormal   Collection Time    08/16/13  5:10 AM      Result Value Range   Sodium 139  135 - 145 mEq/L   Potassium 3.5  3.5 - 5.1 mEq/L   Chloride 103  96 - 112 mEq/L   CO2 24  19 - 32 mEq/L   Glucose, Bld 118 (*) 70 - 99 mg/dL   BUN 20  6 - 23 mg/dL   Creatinine, Ser 0.45  0.50 - 1.10 mg/dL   Calcium 9.4  8.4 - 40.9 mg/dL   Total Protein 6.7  6.0 - 8.3 g/dL   Albumin 3.3 (*) 3.5 - 5.2 g/dL   AST 13  0 - 37 U/L    ALT 12  0 - 35 U/L   Alkaline Phosphatase 47  39 - 117 U/L   Total Bilirubin 0.2 (*) 0.3 - 1.2 mg/dL   GFR calc non Af Amer 83 (*) >90 mL/min   GFR calc Af Amer >90  >90 mL/min   Comment: (NOTE)     The eGFR has been calculated using the CKD EPI equation.     This calculation has not been validated in all clinical situations.     eGFR's persistently <90 mL/min signify possible Chronic Kidney     Disease.  MAGNESIUM     Status: None   Collection Time    08/16/13  5:10 AM      Result Value Range   Magnesium 2.0  1.5 - 2.5 mg/dL  PHOSPHORUS     Status: None   Collection Time    08/16/13  5:10 AM      Result Value Range   Phosphorus 4.5  2.3 - 4.6 mg/dL  CBC     Status: Abnormal   Collection Time    08/16/13  5:10 AM      Result Value Range   WBC 6.8  4.0 - 10.5 K/uL   RBC 3.45 (*) 3.87 - 5.11 MIL/uL   Hemoglobin 10.6 (*) 12.0 - 15.0 g/dL   HCT 81.1 (*) 91.4 - 78.2 %   MCV 92.5  78.0 - 100.0 fL   MCH 30.7  26.0 - 34.0 pg   MCHC 33.2  30.0 - 36.0 g/dL   RDW 95.6  21.3 - 08.6 %   Platelets 314  150 - 400 K/uL  DIFFERENTIAL     Status:  Abnormal   Collection Time    08/16/13  5:10 AM      Result Value Range   Neutrophils Relative % 58  43 - 77 %   Neutro Abs 3.9  1.7 - 7.7 K/uL   Lymphocytes Relative 24  12 - 46 %   Lymphs Abs 1.6  0.7 - 4.0 K/uL   Monocytes Relative 11  3 - 12 %   Monocytes Absolute 0.7  0.1 - 1.0 K/uL   Eosinophils Relative 7 (*) 0 - 5 %   Eosinophils Absolute 0.5  0.0 - 0.7 K/uL   Basophils Relative 1  0 - 1 %   Basophils Absolute 0.0  0.0 - 0.1 K/uL    Imaging / Studies: Dg Abd Acute W/chest  08/14/2013   CLINICAL DATA:  42 year old female with abdominal pain -followup small bowel obstruction.  EXAM: ACUTE ABDOMEN SERIES (ABDOMEN 2 VIEW & CHEST 1 VIEW)  COMPARISON:  08/13/2013 and prior radiographs.  FINDINGS: The cardiomediastinal silhouette is unremarkable.  There is no evidence of focal airspace disease, pleural effusion or pneumothorax.  A right  PICC line, NG tube and thoracic neurostimulator leads again identified.  Distended small bowel loops with air-fluid levels are again identified. There has been little change in caliber of the dilated small bowel loops measuring up to 5 cm.  Small amount of stool within the colon is noted.  There is no evidence of pneumoperitoneum.  IMPRESSION: Unchanged dilated small bowel loops with air-fluid levels compatible with small bowel obstruction. No evidence of pneumoperitoneum.  No evidence of acute cardiopulmonary disease.   Electronically Signed   By: Laveda Abbe M.D.   On: 08/14/2013 17:27    Medications / Allergies: per chart  Antibiotics: Anti-infectives   None

## 2013-08-17 ENCOUNTER — Inpatient Hospital Stay (HOSPITAL_COMMUNITY): Payer: Managed Care, Other (non HMO)

## 2013-08-17 ENCOUNTER — Encounter (HOSPITAL_COMMUNITY): Payer: Self-pay | Admitting: Surgery

## 2013-08-17 DIAGNOSIS — Z5331 Laparoscopic surgical procedure converted to open procedure: Secondary | ICD-10-CM

## 2013-08-17 LAB — CBC WITH DIFFERENTIAL/PLATELET
Basophils Relative: 0 % (ref 0–1)
Eosinophils Absolute: 0 10*3/uL (ref 0.0–0.7)
HCT: 35 % — ABNORMAL LOW (ref 36.0–46.0)
Hemoglobin: 12 g/dL (ref 12.0–15.0)
Lymphocytes Relative: 2 % — ABNORMAL LOW (ref 12–46)
Lymphs Abs: 0.5 10*3/uL — ABNORMAL LOW (ref 0.7–4.0)
MCHC: 34.3 g/dL (ref 30.0–36.0)
Monocytes Absolute: 1.3 10*3/uL — ABNORMAL HIGH (ref 0.1–1.0)
Monocytes Relative: 6 % (ref 3–12)
Neutro Abs: 21.8 10*3/uL — ABNORMAL HIGH (ref 1.7–7.7)
Neutrophils Relative %: 92 % — ABNORMAL HIGH (ref 43–77)
RBC: 3.84 MIL/uL — ABNORMAL LOW (ref 3.87–5.11)
WBC: 23.6 10*3/uL — ABNORMAL HIGH (ref 4.0–10.5)

## 2013-08-17 LAB — BASIC METABOLIC PANEL
BUN: 19 mg/dL (ref 6–23)
Calcium: 9.4 mg/dL (ref 8.4–10.5)
Chloride: 100 mEq/L (ref 96–112)
Creatinine, Ser: 0.76 mg/dL (ref 0.50–1.10)
GFR calc Af Amer: >90 mL/min (ref >90)
Glucose, Bld: 270 mg/dL — ABNORMAL HIGH (ref 70–99)
Potassium: 4 mEq/L (ref 3.5–5.1)

## 2013-08-17 LAB — GLUCOSE, CAPILLARY
Glucose-Capillary: 246 mg/dL — ABNORMAL HIGH (ref 70–99)
Glucose-Capillary: 258 mg/dL — ABNORMAL HIGH (ref 70–99)
Glucose-Capillary: 158 mg/dL — ABNORMAL HIGH (ref 70–99)

## 2013-08-17 LAB — CK TOTAL AND CKMB (NOT AT ARMC)
CK, MB: 2.2 ng/mL (ref 0.3–4.0)
Relative Index: INVALID (ref 0.0–2.5)
Total CK: 52 U/L (ref 7–177)

## 2013-08-17 LAB — PHOSPHORUS: Phosphorus: 2.8 mg/dL (ref 2.3–4.6)

## 2013-08-17 MED ORDER — HYDROMORPHONE HCL PF 2 MG/ML IJ SOLN
2.0000 mg | INTRAMUSCULAR | Status: DC | PRN
  Administered 2013-08-17 – 2013-08-23 (×4): 2 mg via INTRAVENOUS
  Filled 2013-08-17 (×4): qty 1

## 2013-08-17 MED ORDER — HYDROMORPHONE 0.3 MG/ML IV SOLN
INTRAVENOUS | Status: DC
  Administered 2013-08-17: 0.999 mg via INTRAVENOUS
  Administered 2013-08-17 – 2013-08-18 (×2): via INTRAVENOUS
  Administered 2013-08-18: 2.79 mg via INTRAVENOUS
  Administered 2013-08-18: 1.99 mg via INTRAVENOUS
  Administered 2013-08-18: 3.91 mg via INTRAVENOUS
  Administered 2013-08-18: 1.59 mg via INTRAVENOUS
  Administered 2013-08-18: 1.19 mg via INTRAVENOUS
  Administered 2013-08-18: 0.799 mg via INTRAVENOUS
  Administered 2013-08-19: 1.59 mg via INTRAVENOUS
  Administered 2013-08-19: 0.599 mg via INTRAVENOUS
  Administered 2013-08-19 (×2): 1.99 mg via INTRAVENOUS
  Administered 2013-08-19: 11:00:00 via INTRAVENOUS
  Administered 2013-08-19: 1.79 mg via INTRAVENOUS
  Administered 2013-08-20: 1.71 mg via INTRAVENOUS
  Administered 2013-08-20: 0.999 mg via INTRAVENOUS
  Administered 2013-08-20: 1.39 mg via INTRAVENOUS
  Administered 2013-08-20: 1.99 mg via INTRAVENOUS
  Administered 2013-08-20: 12:00:00 via INTRAVENOUS
  Administered 2013-08-20: 1.19 mg via INTRAVENOUS
  Administered 2013-08-20: 0.399 mg via INTRAVENOUS
  Administered 2013-08-21: 2.59 mg via INTRAVENOUS
  Administered 2013-08-21: 1.59 mg via INTRAVENOUS
  Administered 2013-08-21: 08:00:00 via INTRAVENOUS
  Administered 2013-08-21: 3.39 mg via INTRAVENOUS
  Administered 2013-08-21: 0.999 mg via INTRAVENOUS
  Administered 2013-08-21: 2.39 mg via INTRAVENOUS
  Administered 2013-08-21: 1.99 mg via INTRAVENOUS
  Administered 2013-08-22: 2.79 mg via INTRAVENOUS
  Administered 2013-08-22: 1.39 mg via INTRAVENOUS
  Administered 2013-08-22: 0.599 mg via INTRAVENOUS
  Administered 2013-08-22: 0.799 mg via INTRAVENOUS
  Administered 2013-08-22: 02:00:00 via INTRAVENOUS
  Administered 2013-08-22: 1.99 mg via INTRAVENOUS
  Administered 2013-08-22: 0.799 mg via INTRAVENOUS
  Administered 2013-08-23 (×2): 1.59 mg via INTRAVENOUS
  Administered 2013-08-23: 0.999 mg via INTRAVENOUS
  Administered 2013-08-23: 1.59 mg via INTRAVENOUS
  Administered 2013-08-23: 0.999 mg via INTRAVENOUS
  Administered 2013-08-23: 1.999 mg via INTRAVENOUS
  Administered 2013-08-23: via INTRAVENOUS
  Administered 2013-08-23: 0.999 mg via INTRAVENOUS
  Administered 2013-08-24: 18:00:00 via INTRAVENOUS
  Administered 2013-08-24: 0.4 mg via INTRAVENOUS
  Administered 2013-08-24: 1.59 mg via INTRAVENOUS
  Administered 2013-08-24 (×2): 1.79 mg via INTRAVENOUS
  Administered 2013-08-24 (×2): 0.799 mg via INTRAVENOUS
  Administered 2013-08-25: 2.99 mg via INTRAVENOUS
  Administered 2013-08-25: 0.4 mg via INTRAVENOUS
  Administered 2013-08-25: 0.799 mg via INTRAVENOUS
  Filled 2013-08-17 (×10): qty 25

## 2013-08-17 MED ORDER — METOPROLOL TARTRATE 1 MG/ML IV SOLN
5.0000 mg | Freq: Four times a day (QID) | INTRAVENOUS | Status: DC
  Administered 2013-08-17 – 2013-08-26 (×32): 5 mg via INTRAVENOUS
  Filled 2013-08-17 (×41): qty 5

## 2013-08-17 MED ORDER — FAT EMULSION 20 % IV EMUL
250.0000 mL | INTRAVENOUS | Status: AC
  Administered 2013-08-17: 250 mL via INTRAVENOUS
  Filled 2013-08-17: qty 250

## 2013-08-17 MED ORDER — ONDANSETRON HCL 4 MG/2ML IJ SOLN: 4.0000 mg | Freq: Four times a day (QID) | INTRAMUSCULAR | Status: DC | PRN

## 2013-08-17 MED ORDER — TRACE MINERALS CR-CU-F-FE-I-MN-MO-SE-ZN IV SOLN
INTRAVENOUS | Status: AC
  Administered 2013-08-17: 17:00:00 via INTRAVENOUS
  Filled 2013-08-17: qty 2000

## 2013-08-17 MED ORDER — NALOXONE HCL 0.4 MG/ML IJ SOLN: 0.4000 mg | INTRAMUSCULAR | Status: DC | PRN

## 2013-08-17 MED ORDER — SODIUM CHLORIDE 0.9 % IJ SOLN: 9.0000 mL | INTRAMUSCULAR | Status: DC | PRN

## 2013-08-17 MED ORDER — NITROGLYCERIN 0.4 MG SL SUBL
SUBLINGUAL_TABLET | SUBLINGUAL | Status: AC
  Filled 2013-08-17: qty 25

## 2013-08-17 MED ORDER — NITROGLYCERIN 0.4 MG SL SUBL
0.4000 mg | SUBLINGUAL_TABLET | SUBLINGUAL | Status: DC | PRN
  Administered 2013-08-17 (×3): 0.4 mg via SUBLINGUAL

## 2013-08-17 MED ORDER — FENTANYL 50 MCG/HR TD PT72
50.0000 ug | MEDICATED_PATCH | TRANSDERMAL | Status: DC
  Filled 2013-08-17 (×3): qty 1

## 2013-08-17 MED ORDER — KETOROLAC TROMETHAMINE 15 MG/ML IJ SOLN
15.0000 mg | Freq: Four times a day (QID) | INTRAMUSCULAR | Status: AC
  Administered 2013-08-17 – 2013-08-18 (×4): 15 mg via INTRAVENOUS
  Administered 2013-08-18: 30 mg via INTRAVENOUS
  Administered 2013-08-18 – 2013-08-19 (×3): 15 mg via INTRAVENOUS
  Administered 2013-08-19 (×2): 30 mg via INTRAVENOUS
  Administered 2013-08-20: 15 mg via INTRAVENOUS
  Filled 2013-08-17 (×2): qty 2
  Filled 2013-08-17: qty 1
  Filled 2013-08-17 (×4): qty 2
  Filled 2013-08-17: qty 1
  Filled 2013-08-17 (×4): qty 2
  Filled 2013-08-17: qty 1

## 2013-08-17 NOTE — Progress Notes (Signed)
Jane English [id number] [birth year]  CARE TEAM:  PCP: Syliva Overman, MD  Outpatient Care Team: Patient Care Team: Kerri Perches, MD as PCP - General Ardeth Sportsman, MD as Consulting Physician (General Surgery) Karn Cassis, MD as Consulting Physician (Neurosurgery) Beverley Fiedler, MD as Consulting Physician (Gastroenterology)  Inpatient Treatment Team: Treatment Team: Attending Provider: Ardeth Sportsman, MD; Technician: Vella Raring, NT; Registered Nurse: Tristan Schroeder, RN; Consulting Physician: Palliative Doristine Johns; Dietitian: Lavena Bullion, RD; Registered Nurse: Lattie Haw, RN; Technician: Oren Bracket [place], NT; Technician: Burnard Bunting, [place]; Registered Nurse: Sydell Axon, RN   Subjective:  In pain - morphine "not touching it" Husband in room NGT troubleshooted   Objective:  Vital signs:  Filed Vitals:   08/17/13 0009 08/17/13 0109 08/17/13 0219 08/17/13 0543  BP: 158/87 158/88 164/90 169/90  Pulse: 99 100 103 110  Temp: 97.4 F (36.3 C) 97.5 F (36.4 C) 97.7 F (36.5 C) 97.9 F (36.6 C)  TempSrc: Oral Oral Oral Oral  Resp: 18 18 18 18   Height:      Weight:      SpO2: 100% 100% 100% 100%    Last BM Date: 08/16/13  Intake/Output   Yesterday:  10/13 0701 - 10/14 0700 In: 2810 [I.V.:1960; TPN:850] Out: 4775 [Urine:2000; Blood:275] This shift:     Bowel function:  Flatus: NO  BM: NO  Drain: Thin bilious  Physical Exam:  General: Pt awake/alert/oriented x4 in mild distress.  Tired Eyes: PERRL, normal EOM.  Sclera clear.  No icterus Neuro: CN II-XII intact w/o focal sensory/motor deficits. Lymph: No head/neck/groin lymphadenopathy Psych:  No delerium/psychosis/paranoia.  Depressed HENT: Normocephalic, Mucus membranes moist.  No thrush.  NGT in place Neck: Supple, No tracheal deviation Chest: No chest wall pain w good excursion CV:  Pulses intact.  Regular rhythm MS: Normal AROM mjr  joints.  No obvious deformity Abdomen: Soft.  Obese.  Nondistended.  Very sensitive at incision.  No incarcerated hernias. Ext:  SCDs BLE.  No mjr edema.  No cyanosis Skin: No petechiae / purpura   Problem List:   Principal Problem:   SBO (small bowel obstruction) Active Problems:   Chronic pain syndrome   Anxiety state, unspecified   DEPRESSION, CHRONIC   Chronic constipation   PTSD (post-traumatic stress disorder)   Palliative care encounter   Abdominal pain, unspecified site  SURGERY 07/29/2013 POST-OPERATIVE DIAGNOSIS: recurrent abdominal pain,extensive adhesions   PROCEDURE: Procedure(s):  diagnostic laporoscopy  LYSIS OF ADHESIONS  serosal repair  SURGEON: Surgeon(s):  Ardeth Sportsman, MD  Kandis Cocking, MD - Asst      Assessment  Jane English  42 y.o. female  1 Day Post-Op    Partial small bowel obstruction in patient with chronic pain on chronic narcotics s/p significant adhesiolysis & repeat exlap with SB resection  Plan:  -Continue IV fluid hydration.  TNA  -Improve pain control.  Retry Dilaudid PCA.  Ketorolac/robaxin RTC  -PRN nausea control.   -palliate sore throat  -VTE prophylaxis- SCDs, etc  -mobilize as tolerated to help recovery.  The more she can walk, the better.  -?control depression.  Difficult with ileus/SBO.  D/w Palliative Care team in past.  She needs to get back to her chronic pain specialist at [facility] at some point, but this is not a great time to radically change her regimen if possible.     -I updated the patient's status to the patient & her husband.  Recommendations were made.  Questions were answered.  They expressed understanding & appreciation.    Ardeth Sportsman, M.D., F.A.C.S. Gastrointestinal and Minimally Invasive Surgery [facility] [address] [place], [place] [address] [phone] Main / Paging   08/17/2013   Results:   Labs: Results for orders placed during  the hospital encounter of 08/08/13 (from the past 48 hour(s))  BASIC METABOLIC PANEL     Status: Abnormal   Collection Time    08/15/13  8:25 AM      Result Value Range   Sodium 136  135 - 145 mEq/L   Potassium 3.8  3.5 - 5.1 mEq/L   Chloride 103  96 - 112 mEq/L   CO2 23  19 - 32 mEq/L   Glucose, Bld 107 (*) 70 - 99 mg/dL   BUN 18  6 - 23 mg/dL   Creatinine, Ser 1.32  0.50 - 1.10 mg/dL   Calcium 9.3  8.4 - 44.0 mg/dL   GFR calc non Af Amer 85 (*) >90 mL/min   GFR calc Af Amer >90  >90 mL/min   Comment: (NOTE)     The eGFR has been calculated using the CKD EPI equation.     This calculation has not been validated in all clinical situations.     eGFR's persistently <90 mL/min signify possible Chronic Kidney     Disease.  GLUCOSE, CAPILLARY     Status: Abnormal   Collection Time    08/15/13  5:06 PM      Result Value Range   Glucose-Capillary 104 (*) 70 - 99 mg/dL  GLUCOSE, CAPILLARY     Status: Abnormal   Collection Time    08/15/13 11:52 PM      Result Value Range   Glucose-Capillary 112 (*) 70 - 99 mg/dL  COMPREHENSIVE METABOLIC PANEL     Status: Abnormal   Collection Time    08/16/13  5:10 AM      Result Value Range   Sodium 139  135 - 145 mEq/L   Potassium 3.5  3.5 - 5.1 mEq/L   Chloride 103  96 - 112 mEq/L   CO2 24  19 - 32 mEq/L   Glucose, Bld 118 (*) 70 - 99 mg/dL   BUN 20  6 - 23 mg/dL   Creatinine, Ser 1.02  0.50 - 1.10 mg/dL   Calcium 9.4  8.4 - 72.5 mg/dL   Total Protein 6.7  6.0 - 8.3 g/dL   Albumin 3.3 (*) 3.5 - 5.2 g/dL   AST 13  0 - 37 U/L   ALT 12  0 - 35 U/L   Alkaline Phosphatase 47  39 - 117 U/L   Total Bilirubin 0.2 (*) 0.3 - 1.2 mg/dL   GFR calc non Af Amer 83 (*) >90 mL/min   GFR calc Af Amer >90  >90 mL/min   Comment: (NOTE)     The eGFR has been calculated using the CKD EPI equation.     This calculation has not been validated in all clinical situations.     eGFR's persistently <90 mL/min signify possible Chronic Kidney     Disease.   MAGNESIUM     Status: None   Collection Time    08/16/13  5:10 AM      Result Value Range   Magnesium 2.0  1.5 - 2.5 mg/dL  PHOSPHORUS     Status: None   Collection Time    08/16/13  5:10  AM      Result Value Range   Phosphorus 4.5  2.3 - 4.6 mg/dL  CBC     Status: Abnormal   Collection Time    08/16/13  5:10 AM      Result Value Range   WBC 6.8  4.0 - 10.5 K/uL   RBC 3.45 (*) 3.87 - 5.11 MIL/uL   Hemoglobin 10.6 (*) 12.0 - 15.0 g/dL   HCT 16.1 (*) 09.6 - 04.5 %   MCV 92.5  78.0 - 100.0 fL   MCH 30.7  26.0 - 34.0 pg   MCHC 33.2  30.0 - 36.0 g/dL   RDW 40.9  81.1 - 91.4 %   Platelets 314  150 - 400 K/uL  DIFFERENTIAL     Status: Abnormal   Collection Time    08/16/13  5:10 AM      Result Value Range   Neutrophils Relative % 58  43 - 77 %   Neutro Abs 3.9  1.7 - 7.7 K/uL   Lymphocytes Relative 24  12 - 46 %   Lymphs Abs 1.6  0.7 - 4.0 K/uL   Monocytes Relative 11  3 - 12 %   Monocytes Absolute 0.7  0.1 - 1.0 K/uL   Eosinophils Relative 7 (*) 0 - 5 %   Eosinophils Absolute 0.5  0.0 - 0.7 K/uL   Basophils Relative 1  0 - 1 %   Basophils Absolute 0.0  0.0 - 0.1 K/uL  TRIGLYCERIDES     Status: None   Collection Time    08/16/13  5:10 AM      Result Value Range   Triglycerides 112  <150 mg/dL   Comment: Performed at [facility]  PREALBUMIN     Status: None   Collection Time    08/16/13  5:10 AM      Result Value Range   Prealbumin 18.7  17.0 - 34.0 mg/dL   Comment: Performed at [facility]  GLUCOSE, CAPILLARY     Status: Abnormal   Collection Time    08/16/13  7:30 AM      Result Value Range   Glucose-Capillary 116 (*) 70 - 99 mg/dL  SURGICAL PCR SCREEN     Status: None   Collection Time    08/16/13 10:05 AM      Result Value Range   MRSA, PCR NEGATIVE  NEGATIVE   Staphylococcus aureus NEGATIVE  NEGATIVE   Comment:            The Xpert SA Assay (FDA     approved for NASAL specimens     in patients over 55 years of age),     is one component  of     a comprehensive surveillance     program.  Test performance has     been validated by [employer] for patients greater     than or equal to 102 year old.     It is not intended     to diagnose infection nor to     guide or monitor treatment.  GLUCOSE, CAPILLARY     Status: Abnormal   Collection Time    08/16/13  4:11 PM      Result Value Range   Glucose-Capillary 107 (*) 70 - 99 mg/dL  GLUCOSE, CAPILLARY     Status: Abnormal   Collection Time    08/16/13 10:37 PM      Result Value Range  Glucose-Capillary 246 (*) 70 - 99 mg/dL  GLUCOSE, CAPILLARY     Status: Abnormal   Collection Time    08/16/13 11:50 PM      Result Value Range   Glucose-Capillary 308 (*) 70 - 99 mg/dL  GLUCOSE, CAPILLARY     Status: Abnormal   Collection Time    08/17/13 12:20 AM      Result Value Range   Glucose-Capillary 304 (*) 70 - 99 mg/dL  GLUCOSE, CAPILLARY     Status: Abnormal   Collection Time    08/17/13  3:46 AM      Result Value Range   Glucose-Capillary 257 (*) 70 - 99 mg/dL  BASIC METABOLIC PANEL     Status: Abnormal   Collection Time    08/17/13  4:40 AM      Result Value Range   Sodium 133 (*) 135 - 145 mEq/L   Potassium 4.0  3.5 - 5.1 mEq/L   Chloride 100  96 - 112 mEq/L   CO2 21  19 - 32 mEq/L   Glucose, Bld 270 (*) 70 - 99 mg/dL   BUN 19  6 - 23 mg/dL   Creatinine, Ser 1.61  0.50 - 1.10 mg/dL   Calcium 9.4  8.4 - 09.6 mg/dL   GFR calc non Af Amer >90  >90 mL/min   GFR calc Af Amer >90  >90 mL/min   Comment: (NOTE)     The eGFR has been calculated using the CKD EPI equation.     This calculation has not been validated in all clinical situations.     eGFR's persistently <90 mL/min signify possible Chronic Kidney     Disease.  PHOSPHORUS     Status: None   Collection Time    08/17/13  4:40 AM      Result Value Range   Phosphorus 2.8  2.3 - 4.6 mg/dL    Imaging / Studies: No results found.  Medications / Allergies: per chart  Antibiotics: Anti-infectives    Start     Dose/Rate Route Frequency Ordered Stop   08/17/13 0600  clindamycin (CLEOCIN) IVPB 600 mg    Comments:  Pharmacy may adjust dosing strength, interval, or rate of medication as needed for optimal therapy for the patient Send with patient on call to the OR.  Anesthesia to complete antibiotic administration <36min prior to incision per [facility].   600 mg 100 mL/hr over 30 Minutes Intravenous On call to O.R. 08/16/13 1720 08/18/13 0559   08/17/13 0600  gentamicin (GARAMYCIN) IVPB 100 mg  Status:  Discontinued    Comments:  Pharmacy may adjust dosing strength, schedule, rate of infusion, etc as needed to optimize therapy Send with patient on call to the OR.  Anesthesia to complete antibiotic administration <48min prior to incision per [facility].   100 mg 200 mL/hr over 30 Minutes Intravenous On call to O.R. 08/16/13 1720 08/16/13 1732   08/16/13 1732  gentamicin (GARAMYCIN) 370 mg in dextrose 5 % 100 mL IVPB     5 mg/kg  74.8 kg 218.5 mL/hr over 30 Minutes Intravenous 30 min pre-op 08/16/13 1731 08/16/13 1836

## 2013-08-17 NOTE — Progress Notes (Signed)
[facility]  [facility] is providing the following services: patient declined rw  If patient discharges after hours, please call [phone].   Renard Hamper 08/17/2013, 11:37 AM

## 2013-08-17 NOTE — Progress Notes (Signed)
Md rounded. Wanted to make staff aware of NG tube and to consistently insure that it is functioning properly. Please pay special attention to filter and make sure it isn't wet. Will pass on to next shift and continue to monitor. Night nurse did report issues with suction regulator but [employer] evaluated and changed regulators. No other concerns at this time.

## 2013-08-17 NOTE — Progress Notes (Signed)
PARENTERAL NUTRITION CONSULT NOTE - FOLLOW UP  Pharmacy Consult for TNA Indication: Bowel obstruction  Allergies  Allergen Reactions  . Neurontin [Gabapentin] Other (See Comments)    Other reaction(s): Mental Status Changes (intolerance) hallucinations Sleep walking  . Amoxicillin Hives  . Penicillins Hives  . Latex Rash   Patient Measurements: Height: 5\' 6"  (167.6 cm) Weight: 164 lb 14.5 oz (74.8 kg) IBW/kg (Calculated) : 59.3 Adjusted Body Weight: 64kg Usual Weight: 75kg  Vital Signs: Temp: 97.9 F (36.6 C) (10/14 0543) Temp src: Oral (10/14 0543) BP: 169/90 mmHg (10/14 0543) Pulse Rate: 110 (10/14 0543) Intake/Output from previous day: 10/13 0701 - 10/14 0700 In: 2810 [I.V.:1960; TPN:850] Out: 4775 [Urine:2000; Blood:275] Intake/Output from this shift:    Labs:  Recent Labs  08/16/13 0510  WBC 6.8  HGB 10.6*  HCT 31.9*  PLT 314    Recent Labs  08/15/13 0825 08/16/13 0510 08/17/13 0440  NA 136 139 133*  K 3.8 3.5 4.0  CL 103 103 100  CO2 23 24 21   GLUCOSE 107* 118* 270*  BUN 18 20 19   CREATININE 0.83 0.84 0.76  CALCIUM 9.3 9.4 9.4  MG  --  2.0  --   PHOS  --  4.5 2.8  PROT  --  6.7  --   ALBUMIN  --  3.3*  --   AST  --  13  --   ALT  --  12  --   ALKPHOS  --  47  --   BILITOT  --  0.2*  --   PREALBUMIN  --  18.7  --   TRIG  --  112  --   Corrected calcium: 9.42 (10/10)  Estimated Creatinine Clearance: 92.8 ml/min (by C-G formula based on Cr of 0.76).   Recent Labs  08/16/13 2350 08/17/13 0020 08/17/13 0346  GLUCAP 308* 304* 257*   Medications:  Scheduled:  . budesonide-formoterol  2 puff Inhalation BID  . fluticasone  2 spray Each Nare Daily  . heparin  5,000 Units Subcutaneous Q8H  . HYDROmorphone      . HYDROmorphone PCA 0.3 mg/mL   Intravenous Q4H  . insulin aspart  0-15 Units Subcutaneous Q8H  . ketorolac  15-30 mg Intravenous Q6H  . lip balm  1 application Topical BID  . magic mouthwash  15 mL Oral QID  . methocarbamol  (ROBAXIN) IV  500 mg Intravenous Q8H  . metoprolol  5 mg Intravenous Q6H  . olopatadine  1 drop Both Eyes BID   Infusions:  . dextrose 5 % and 0.45 % NaCl with KCl 20 mEq/L 20 mL/hr at 08/17/13 0036  . Marland KitchenTPN (CLINIMIX-E) Adult 75 mL/hr at 08/16/13 2123   And  . fat emulsion 250 mL (08/16/13 2123)  . Marland KitchenTPN (CLINIMIX-E) Adult     And  . fat emulsion     Insulin Requirements in the past 24 hours:  16 units - SSI moderate scale q8h ordered (patient had been refusing insulin coverage) CBG 308-> 257 g/dl this am-patient accepted CBG coverage  IVF: D5 1/2NS w/ 20KCl at 10-20 ml/hr  Current Nutrition:  NPO except ice chips post-op, NG tube remains, removed previously 10/11  Clinimix E5/15 at 75 ml/hr Lipids 20% at 10 ml/hr  Nutritional Goals:  Per RD 10/9: Kcal: 1650-1750  Protein: 85-95g  Fluid: 1.6-1.7L/day  Clinimix-E 5/15 at 75 mL/hr + Lipids 20% 15mL/hr daily will provide 90 grams protein and 1758 kcal per day  Assessment: 42 y/o F s/p enterolysis  of adhesions 07/29/13, presented to ED 08/08/13 with small bowel obstruction. Per CCS, deferred surgery at this time. TNA initiated 10/9 pm. -Expl lap to open lap 10/13: small bowel resection due to inflammatory mass of mesentery, LOA,    Glucose: CBG's were consistenly < 150 and patient was refusing some CBG checks  Electrolytes: Na 133, Phos 4.5 yesterday -> 2.8 today  LFTs: wnl (10/13)  TGs: normal, 94 (10/10), 112 (10/13)  Prealbumin: low, 13.3 (10/10), 18.7 (10/13)  TPN Access: Double lumen PICC TPN day#: 6  Plan: At 1800 today:  Continue Clinimix E 5/15 at goal 75 ml/hr  Continue 20% fat emulsion at 35ml/hr  TNA to contain standard multivitamins and trace elements daily  Defer decision to stop CBGs to MD, since this is an important part of monitoring TNA patient's.   TNA lab panels on Mondays & Thursdays.  F/u daily for TNA tolerance, diet advancement  Otho Bellows PharmD Pager [phone] 08/17/2013, 8:42  AM

## 2013-08-17 NOTE — Significant Event (Signed)
Rapid Response Event Note 0900-0950  Overview: Called Chest Pain      Initial Focused Assessment: VS charted, pain mid sternum 10/10, sharp then becoming more like pressure, acute onset this am.   Interventions: 12 Lead EKG, stat PCXR obtained, cardiac enzymes and CBC ordered. Chest pain protocol initiated with Nitroglycerin administration. O2 continued. See additional pain / anxiety meds charted by RN.   Event Summary: Dr Michaell Cowing notified of pain / pressure unresolved by meds. Pt sleeping.   at      at          [facility], Ferd Hibbs

## 2013-08-17 NOTE — Progress Notes (Signed)
Called by nurse over patient's complaint of chest pain.  Saturating well on 2 L.  Not tachypneic.  EKG reportedly normal.  Chest x-ray shows no pneumothorax or effusion.  Receiving narcotics.  Hypertensive.  Tachycardic.  Gradually given more aggressive narcotic and benzodiazepines for more aggressive pain/anxiety controlled.  ?Panic attack.  MI/PE less likely.   Patient sleeping more comfortably now.  We will continue to monitor closely.  If worsens, may need to be monitored in step down unit.  Agree with cardiac workup.

## 2013-08-17 NOTE — Progress Notes (Signed)
Patient complained of chest pain. BP 185/111 Hr 122. Medicated for BP, anxiety and pain. She then stated pain was radiating and more like pressure. I called RR to evaluate the patient. Manual BP recheck was 190/90. CP protocol initiated. MD notified. Cxray and stat cardiac enzymes was normal. Medicated patient again for pain along with Nitro. BP down to 126 systolic. Patient resting quietly. Husband at bedside. Informed to call me if anything changes.

## 2013-08-18 LAB — GLUCOSE, CAPILLARY
Glucose-Capillary: 128 mg/dL — ABNORMAL HIGH (ref 70–99)
Glucose-Capillary: 126 mg/dL — ABNORMAL HIGH (ref 70–99)
Glucose-Capillary: 141 mg/dL — ABNORMAL HIGH (ref 70–99)

## 2013-08-18 LAB — BASIC METABOLIC PANEL
CO2: 23 mEq/L (ref 19–32)
Chloride: 98 mEq/L (ref 96–112)
Creatinine, Ser: 0.96 mg/dL (ref 0.50–1.10)
Glucose, Bld: 141 mg/dL — ABNORMAL HIGH (ref 70–99)

## 2013-08-18 LAB — PHOSPHORUS: Phosphorus: 4.9 mg/dL — ABNORMAL HIGH (ref 2.3–4.6)

## 2013-08-18 LAB — MAGNESIUM: Magnesium: 2.1 mg/dL (ref 1.5–2.5)

## 2013-08-18 MED ORDER — FAT EMULSION 20 % IV EMUL
250.0000 mL | INTRAVENOUS | Status: AC
  Administered 2013-08-18: 250 mL via INTRAVENOUS
  Filled 2013-08-18: qty 250

## 2013-08-18 MED ORDER — TRACE MINERALS CR-CU-F-FE-I-MN-MO-SE-ZN IV SOLN
INTRAVENOUS | Status: AC
  Administered 2013-08-18: 18:00:00 via INTRAVENOUS
  Filled 2013-08-18: qty 2000

## 2013-08-18 NOTE — Progress Notes (Signed)
PARENTERAL NUTRITION CONSULT NOTE - FOLLOW UP  Pharmacy Consult for TNA Indication: Bowel obstruction  Allergies  Allergen Reactions  . Neurontin [Gabapentin] Other (See Comments)    Other reaction(s): Mental Status Changes (intolerance) hallucinations Sleep walking  . Amoxicillin Hives  . Penicillins Hives  . Latex Rash   Patient Measurements: Height: 5\' 6"  (167.6 cm) Weight: 164 lb 14.5 oz (74.8 kg) IBW/kg (Calculated) : 59.3 Adjusted Body Weight: 64kg Usual Weight: 75kg  Vital Signs: Temp: 98.3 F (36.8 C) (10/15 0510) Temp src: Axillary (10/15 0510) BP: 140/77 mmHg (10/15 0510) Pulse Rate: 110 (10/15 0510) Intake/Output from previous day: 10/14 0701 - 10/15 0700 In: 2775 [I.V.:800; IV Piggyback:275; TPN:1700] Out: 2000 [Urine:1250; Emesis/NG output:750] Intake/Output from this shift:    Labs:  Recent Labs  08/16/13 0510 08/17/13 0920  WBC 6.8 23.6*  HGB 10.6* 12.0  HCT 31.9* 35.0*  PLT 314 360    Recent Labs  08/15/13 0825 08/16/13 0510 08/17/13 0440  NA 136 139 133*  K 3.8 3.5 4.0  CL 103 103 100  CO2 23 24 21   GLUCOSE 107* 118* 270*  BUN 18 20 19   CREATININE 0.83 0.84 0.76  CALCIUM 9.3 9.4 9.4  MG  --  2.0  --   PHOS  --  4.5 2.8  PROT  --  6.7  --   ALBUMIN  --  3.3*  --   AST  --  13  --   ALT  --  12  --   ALKPHOS  --  47  --   BILITOT  --  0.2*  --   PREALBUMIN  --  18.7  --   TRIG  --  112  --   Corrected calcium: 9.42 (10/10)  Estimated Creatinine Clearance: 92.8 ml/min (by C-G formula based on Cr of 0.76).   Recent Labs  08/17/13 1555 08/17/13 2342 08/18/13 0319  GLUCAP 158* 134* 128*   Medications:  Scheduled:  . budesonide-formoterol  2 puff Inhalation BID  . fentaNYL  50 mcg Transdermal Q72H  . fluticasone  2 spray Each Nare Daily  . heparin  5,000 Units Subcutaneous Q8H  . HYDROmorphone PCA 0.3 mg/mL   Intravenous Q4H  . insulin aspart  0-15 Units Subcutaneous Q8H  . ketorolac  15-30 mg Intravenous Q6H  . lip  balm  1 application Topical BID  . magic mouthwash  15 mL Oral QID  . methocarbamol (ROBAXIN) IV  500 mg Intravenous Q8H  . metoprolol  5 mg Intravenous Q6H  . olopatadine  1 drop Both Eyes BID   Infusions:  . dextrose 5 % and 0.45 % NaCl with KCl 20 mEq/L 20 mL/hr at 08/17/13 0036  . Marland KitchenTPN (CLINIMIX-E) Adult 75 mL/hr at 08/17/13 1726   And  . fat emulsion 250 mL (08/17/13 1725)   Insulin Requirements in the past 24 hours:  10 units - SSI moderate scale q8h   IVF: D5 1/2NS w/ 20KCl at 10-20 ml/hr  Current Nutrition:  NPO except ice chips post-op, NG tube remains, removed previously 10/11  Clinimix E 5/15 at 75 ml/hr Lipids 20% at 10 ml/hr  Nutritional Goals:  Per RD 10/9: Kcal: 1650-1750  Protein: 85-95g  Fluid: 1.6-1.7L/day  Clinimix-E 5/15 at 75 mL/hr + Lipids 20% 34mL/hr daily will provide 90 grams protein and 1758 kcal per day  Assessment: 42 y/o F s/p enterolysis of adhesions 07/29/13, presented to ED 08/08/13 with small bowel obstruction. Per CCS, deferred surgery at this time. TNA initiated  10/9 pm. -Expl lap to open lap 10/13: small bowel resection due to inflammatory mass of mesentery, LOA  Labs:  Electrolytes: BMET today okay.  Phos (4.9) slightly above normal range, will repeat in the morning.    LFTs: wnl (10/13)  TGs: normal, 94 (10/10), 112 (10/13)  Prealbumin: low, 13.3 (10/10), 18.7 (10/13)  CBG's - WNL with SSI coverage   TPN Access: Double lumen PICC TPN day#: 7  Plan: At 1800 today:  Continue Clinimix E 5/15 at goal 75 ml/hr  Continue 20% fat emulsion at 57ml/hr daily  TNA to contain standard multivitamins and trace elements daily  TNA lab panels on Mondays & Thursdays  F/u daily for TNA tolerance, diet advancement  Continue Moderate SSI Q8h  Continue M[facility]F at [facility]  Phos in AM   Karlena Luebke, Loma Messing PharmD Pager #: [phone] 8:03 AM 08/18/2013

## 2013-08-18 NOTE — Progress Notes (Signed)
Jane English [id number] 1970/12/10  CARE TEAM:  PCP: Syliva Overman, MD  Outpatient Care Team: Patient Care Team: Kerri Perches, MD as PCP - General Ardeth Sportsman, MD as Consulting Physician (General Surgery) Karn Cassis, MD as Consulting Physician (Neurosurgery) Beverley Fiedler, MD as Consulting Physician (Gastroenterology)  Inpatient Treatment Team: Treatment Team: Attending Provider: Ardeth Sportsman, MD; Technician: Vella Raring, NT; Registered Nurse: Tristan Schroeder, RN; Consulting Physician: Palliative Doristine Johns; Dietitian: Lavena Bullion, RD; Registered Nurse: Lattie Haw, RN; Technician: Oren Bracket [place], NT; Technician: Burnard Bunting, NT   Subjective:  Had episode of chest pain.  Workup negative.  Minimal chest discomfort now. Sleeping a lot more.  Using PCA a lot Husband in room NGT troubleshooted.  Output going down Walked in hallways a little   Objective:  Vital signs:  Filed Vitals:   08/18/13 0000 08/18/13 0205 08/18/13 0400 08/18/13 0510  BP:  146/82  140/77  Pulse:  102  110  Temp:  98.4 F (36.9 C)  98.3 F (36.8 C)  TempSrc:  Axillary  Axillary  Resp: 21 18 19 18   Height:      Weight:      SpO2: 100% 100% 100% 100%    Last BM Date: 08/16/13  Intake/Output   Yesterday:  10/14 0701 - 10/15 0700 In: 2775 [I.V.:800; IV Piggyback:275; TPN:1700] Out: 2000 [Urine:1250; Emesis/NG output:750] This shift:  Total I/O In: 910 [I.V.:800; IV Piggyback:110] Out: 950 [Urine:700; Emesis/NG output:250]  Bowel function:  Flatus: NO  BM: NO  Drain: Thin bilious  Physical Exam:  General: Pt awake/alert/oriented x4 in mild distress.  Tired Eyes: PERRL, normal EOM.  Sclera clear.  No icterus Neuro: CN II-XII intact w/o focal sensory/motor deficits. Lymph: No head/neck/groin lymphadenopathy Psych:  No delerium/psychosis/paranoia.  Depressed.  Sleepy HENT: Normocephalic, Mucus membranes moist.  No thrush.   NGT in place Neck: Supple, No tracheal deviation Chest: No chest wall pain w good excursion CV:  Pulses intact.  Regular rhythm MS: Normal AROM mjr joints.  No obvious deformity Abdomen: Soft.  Obese.  Nondistended.  Less sensitive at incision.  No incarcerated hernias. Ext:  SCDs BLE.  No mjr edema.  No cyanosis Skin: No petechiae / purpura   Problem List:   Principal Problem:   SBO (small bowel obstruction) Active Problems:   Chronic pain syndrome   Anxiety state, unspecified   DEPRESSION, CHRONIC   Chronic constipation   PTSD (post-traumatic stress disorder)   Palliative care encounter   Abdominal pain, unspecified site  SURGERY 07/29/2013 POST-OPERATIVE DIAGNOSIS: recurrent abdominal pain,extensive adhesions   PROCEDURE: Procedure(s):  diagnostic laporoscopy  LYSIS OF ADHESIONS  serosal repair  SURGEON: Surgeon(s):  Ardeth Sportsman, MD  Kandis Cocking, MD - Asst      Assessment  Jane English  42 y.o. female  2 Days Post-Op    Partial small bowel obstruction in patient with chronic pain on chronic narcotics s/p significant adhesiolysis & repeat exlap with SB resection  Plan:  -Continue IV fluid hydration.  TNA  -Continue nasogastric tube until postoperative ileus resolved.  Lower nasogastric tube output with soft abdomen a hopeful sign.  -Dilaudid PCA.  Ketorolac/robaxin RTC  -PRN nausea control.   -palliate sore throat  -VTE prophylaxis- SCDs, etc  -mobilize as tolerated to help recovery.  The more she can walk, the better.  -?control depression.  Difficult with ileus/SBO.  D/w Palliative Care team in past.  She needs to get  back to her chronic pain specialist at [facility] at some point, but this is not a great time to radically change her regimen if possible.     -I updated the patient's status to the patient & her husband.  Recommendations were made.  Questions were answered.  They expressed understanding & appreciation.    Ardeth Sportsman,  M.D., F.A.C.S. Gastrointestinal and Minimally Invasive Surgery [facility] [address] [place], [place] [address] [phone] Main / Paging   08/18/2013   Results:   Labs: Results for orders placed during the hospital encounter of 08/08/13 (from the past 48 hour(s))  GLUCOSE, CAPILLARY     Status: Abnormal   Collection Time    08/16/13  7:30 AM      Result Value Range   Glucose-Capillary 116 (*) 70 - 99 mg/dL  SURGICAL PCR SCREEN     Status: None   Collection Time    08/16/13 10:05 AM      Result Value Range   MRSA, PCR NEGATIVE  NEGATIVE   Staphylococcus aureus NEGATIVE  NEGATIVE   Comment:            The Xpert SA Assay (FDA     approved for NASAL specimens     in patients over 48 years of age),     is one component of     a comprehensive surveillance     program.  Test performance has     been validated by [employer] for patients greater     than or equal to 32 year old.     It is not intended     to diagnose infection nor to     guide or monitor treatment.  GLUCOSE, CAPILLARY     Status: Abnormal   Collection Time    08/16/13  4:11 PM      Result Value Range   Glucose-Capillary 107 (*) 70 - 99 mg/dL  GLUCOSE, CAPILLARY     Status: Abnormal   Collection Time    08/16/13 10:37 PM      Result Value Range   Glucose-Capillary 246 (*) 70 - 99 mg/dL  GLUCOSE, CAPILLARY     Status: Abnormal   Collection Time    08/16/13 11:50 PM      Result Value Range   Glucose-Capillary 308 (*) 70 - 99 mg/dL  GLUCOSE, CAPILLARY     Status: Abnormal   Collection Time    08/17/13 12:20 AM      Result Value Range   Glucose-Capillary 304 (*) 70 - 99 mg/dL  GLUCOSE, CAPILLARY     Status: Abnormal   Collection Time    08/17/13  3:46 AM      Result Value Range   Glucose-Capillary 257 (*) 70 - 99 mg/dL  BASIC METABOLIC PANEL     Status: Abnormal   Collection Time    08/17/13  4:40 AM      Result Value Range   Sodium 133 (*) 135 - 145  mEq/L   Potassium 4.0  3.5 - 5.1 mEq/L   Chloride 100  96 - 112 mEq/L   CO2 21  19 - 32 mEq/L   Glucose, Bld 270 (*) 70 - 99 mg/dL   BUN 19  6 - 23 mg/dL   Creatinine, Ser 8.29  0.50 - 1.10 mg/dL   Calcium 9.4  8.4 - 56.2 mg/dL   GFR calc non Af Amer >90  >90  mL/min   GFR calc Af Amer >90  >90 mL/min   Comment: (NOTE)     The eGFR has been calculated using the CKD EPI equation.     This calculation has not been validated in all clinical situations.     eGFR's persistently <90 mL/min signify possible Chronic Kidney     Disease.  PHOSPHORUS     Status: None   Collection Time    08/17/13  4:40 AM      Result Value Range   Phosphorus 2.8  2.3 - 4.6 mg/dL  GLUCOSE, CAPILLARY     Status: Abnormal   Collection Time    08/17/13  7:59 AM      Result Value Range   Glucose-Capillary 258 (*) 70 - 99 mg/dL  CBC WITH DIFFERENTIAL     Status: Abnormal   Collection Time    08/17/13  9:20 AM      Result Value Range   WBC 23.6 (*) 4.0 - 10.5 K/uL   RBC 3.84 (*) 3.87 - 5.11 MIL/uL   Hemoglobin 12.0  12.0 - 15.0 g/dL   HCT 24.4 (*) 01.0 - 27.2 %   MCV 91.1  78.0 - 100.0 fL   MCH 31.3  26.0 - 34.0 pg   MCHC 34.3  30.0 - 36.0 g/dL   RDW 53.6  64.4 - 03.4 %   Platelets 360  150 - 400 K/uL   Neutrophils Relative % 92 (*) 43 - 77 %   Neutro Abs 21.8 (*) 1.7 - 7.7 K/uL   Lymphocytes Relative 2 (*) 12 - 46 %   Lymphs Abs 0.5 (*) 0.7 - 4.0 K/uL   Monocytes Relative 6  3 - 12 %   Monocytes Absolute 1.3 (*) 0.1 - 1.0 K/uL   Eosinophils Relative 0  0 - 5 %   Eosinophils Absolute 0.0  0.0 - 0.7 K/uL   Basophils Relative 0  0 - 1 %   Basophils Absolute 0.0  0.0 - 0.1 K/uL  TROPONIN I     Status: None   Collection Time    08/17/13  9:20 AM      Result Value Range   Troponin I <0.30  <0.30 ng/mL   Comment:            Due to the release kinetics of cTnI,     a negative result within the first hours     of the onset of symptoms does not rule out     myocardial infarction with certainty.     If  myocardial infarction is still suspected,     repeat the test at appropriate intervals.  CK TOTAL AND CKMB     Status: None   Collection Time    08/17/13  9:20 AM      Result Value Range   Total CK 52  7 - 177 U/L   CK, MB 2.2  0.3 - 4.0 ng/mL   Relative Index RELATIVE INDEX IS INVALID  0.0 - 2.5   Comment: WHEN CK < 100 U/L             GLUCOSE, CAPILLARY     Status: Abnormal   Collection Time    08/17/13  3:55 PM      Result Value Range   Glucose-Capillary 158 (*) 70 - 99 mg/dL   Comment 1 Notify RN    GLUCOSE, CAPILLARY     Status: Abnormal   Collection Time    08/17/13 11:42 PM  Result Value Range   Glucose-Capillary 134 (*) 70 - 99 mg/dL  GLUCOSE, CAPILLARY     Status: Abnormal   Collection Time    08/18/13  3:19 AM      Result Value Range   Glucose-Capillary 128 (*) 70 - 99 mg/dL    Imaging / Studies: Dg Chest Port 1 View  08/17/2013   CLINICAL DATA:  Chest pain  EXAM: PORTABLE CHEST - 1 VIEW  COMPARISON:  Chest x-ray of 08/14/2013  FINDINGS: No infiltrate or effusion is seen. There is mild peribronchial thickening present which may indicate bronchitis. A right PICC line is present with the tip seen to the region of the lower SVC, overlapping the neuro stimulator electrode leads. Mediastinal contours are stable. Mild cardiomegaly is stable.  IMPRESSION: No pneumonia. Peribronchial thickening may indicate bronchitis.   Electronically Signed   By: Dwyane Dee M.D.   On: 08/17/2013 09:22    Medications / Allergies: per chart  Antibiotics: Anti-infectives   Start     Dose/Rate Route Frequency Ordered Stop   08/17/13 0600  clindamycin (CLEOCIN) IVPB 600 mg  Status:  Discontinued    Comments:  Pharmacy may adjust dosing strength, interval, or rate of medication as needed for optimal therapy for the patient Send with patient on call to the OR.  Anesthesia to complete antibiotic administration <36min prior to incision per [facility].   600 mg 100 mL/hr over 30 Minutes  Intravenous On call to O.R. 08/16/13 1720 08/17/13 0740   08/17/13 0600  gentamicin (GARAMYCIN) IVPB 100 mg  Status:  Discontinued    Comments:  Pharmacy may adjust dosing strength, schedule, rate of infusion, etc as needed to optimize therapy Send with patient on call to the OR.  Anesthesia to complete antibiotic administration <70min prior to incision per [facility]   100 mg 200 mL/hr over 30 Minutes Intravenous On call to O.R. 08/16/13 1720 08/16/13 1732   08/16/13 1732  gentamicin (GARAMYCIN) 370 mg in dextrose 5 % 100 mL IVPB     5 mg/kg  74.8 kg 218.5 mL/hr over 30 Minutes Intravenous 30 min pre-op 08/16/13 1731 08/16/13 1836

## 2013-08-18 NOTE — Progress Notes (Addendum)
   Social visit only to offer support and f/u to initial interaction.  Discussed with patient and her husband I will shadow and engage when she is medically stable, and able and willing to discuss psych-social issues,  interventions and pain management strategies.   Patient and husband made aware that this NP would not engage again until Monday.  PMT will shadow for need.  Both verbalize appreciation for visit.  Lorinda Creed NP  Palliative Medicine Team Team Phone # [phone] Pager [phone]

## 2013-08-18 NOTE — Anesthesia Postprocedure Evaluation (Signed)
Anesthesia Post Note  Patient: Jane English  Procedure(s) Performed: Procedure(s) (LRB): LAPAROSCOPY DIAGNOSTIC/LYSIS OF ADHESIONS (N/A) EXPLORATORY LAPAROTOMY/SMALL BOWEL RESECTION (JEJUNUM) (N/A)  Anesthesia type: General  Patient location: PACU  Post pain: Pain level controlled  Post assessment: Post-op Vital signs reviewed  Last Vitals:  Filed Vitals:   08/18/13 0814  BP:   Pulse:   Temp:   Resp: 18    Post vital signs: Reviewed  Level of consciousness: sedated  Complications: No apparent anesthesia complications

## 2013-08-19 ENCOUNTER — Inpatient Hospital Stay (HOSPITAL_COMMUNITY): Payer: Managed Care, Other (non HMO)

## 2013-08-19 LAB — MAGNESIUM: Magnesium: 2.2 mg/dL (ref 1.5–2.5)

## 2013-08-19 LAB — COMPREHENSIVE METABOLIC PANEL
AST: 7 U/L (ref 0–37)
Albumin: 2.6 g/dL — ABNORMAL LOW (ref 3.5–5.2)
Alkaline Phosphatase: 63 U/L (ref 39–117)
BUN: 23 mg/dL (ref 6–23)
CO2: 23 mEq/L (ref 19–32)
Chloride: 102 mEq/L (ref 96–112)
GFR calc non Af Amer: 64 mL/min — ABNORMAL LOW (ref >90)
Potassium: 3.9 mEq/L (ref 3.5–5.1)
Total Bilirubin: 0.3 mg/dL (ref 0.3–1.2)

## 2013-08-19 LAB — CBC
HCT: 27.5 % — ABNORMAL LOW (ref 36.0–46.0)
Hemoglobin: 9.2 g/dL — ABNORMAL LOW (ref 12.0–15.0)
MCHC: 33.5 g/dL (ref 30.0–36.0)
MCV: 93.2 fL (ref 78.0–100.0)
RBC: 2.95 MIL/uL — ABNORMAL LOW (ref 3.87–5.11)
WBC: 21.2 10*3/uL — ABNORMAL HIGH (ref 4.0–10.5)

## 2013-08-19 LAB — GLUCOSE, CAPILLARY
Glucose-Capillary: 121 mg/dL — ABNORMAL HIGH (ref 70–99)
Glucose-Capillary: 126 mg/dL — ABNORMAL HIGH (ref 70–99)

## 2013-08-19 MED ORDER — METHOCARBAMOL 100 MG/ML IJ SOLN
500.0000 mg | Freq: Three times a day (TID) | INTRAVENOUS | Status: DC
  Administered 2013-08-19 – 2013-08-23 (×11): 500 mg via INTRAVENOUS
  Filled 2013-08-19 (×13): qty 5

## 2013-08-19 MED ORDER — FAT EMULSION 20 % IV EMUL
250.0000 mL | INTRAVENOUS | Status: AC
  Administered 2013-08-19: 250 mL via INTRAVENOUS
  Filled 2013-08-19: qty 250

## 2013-08-19 MED ORDER — IPRATROPIUM BROMIDE 0.02 % IN SOLN
0.5000 mg | Freq: Once | RESPIRATORY_TRACT | Status: AC
  Administered 2013-08-19: 0.5 mg via RESPIRATORY_TRACT

## 2013-08-19 MED ORDER — INFLUENZA VAC SPLIT QUAD 0.5 ML IM SUSP
0.5000 mL | INTRAMUSCULAR | Status: DC | PRN
  Filled 2013-08-19: qty 0.5

## 2013-08-19 MED ORDER — TRACE MINERALS CR-CU-F-FE-I-MN-MO-SE-ZN IV SOLN
INTRAVENOUS | Status: AC
  Administered 2013-08-19: 18:00:00 via INTRAVENOUS
  Filled 2013-08-19: qty 2000

## 2013-08-19 NOTE — Progress Notes (Signed)
Jane English [id number] 07/24/1971  CARE TEAM:  PCP: Syliva Overman, MD  Outpatient Care Team: Patient Care Team: Kerri Perches, MD as PCP - General Ardeth Sportsman, MD as Consulting Physician (General Surgery) Karn Cassis, MD as Consulting Physician (Neurosurgery) Beverley Fiedler, MD as Consulting Physician (Gastroenterology)  Inpatient Treatment Team: Treatment Team: Attending Provider: Ardeth Sportsman, MD; Technician: Vella Raring, NT; Registered Nurse: Tristan Schroeder, RN; Consulting Physician: Palliative Doristine Johns; Dietitian: Lavena Bullion, RD; Registered Nurse: Lattie Haw, RN; Technician: Oren Bracket [place], NT; Technician: Valetta Close, [place]; Registered Nurse: Georgeanne Nim Debrew, RN   Subjective:  Using PCA a lot Carbon dioxide monitor very irritating/interrupting.  Not accurate. NGT output going down Walked in hallways much more Using bathroom independently. C/o right-sided chest pain below the breast.  Anxious about it.  Son and his girlfriend in room.  Objective:  Vital signs:  Filed Vitals:   08/18/13 2010 08/18/13 2130 08/19/13 0102 08/19/13 0350  BP: 111/55 114/52    Pulse: 111 110    Temp: 99.3 F (37.4 C) 98.5 F (36.9 C)    TempSrc: Axillary Axillary    Resp: 21 20 20 20   Height:      Weight:      SpO2: 97% 99% 95% 98%    Last BM Date: 08/16/13  Intake/Output   Yesterday:  10/15 0701 - 10/16 0700 In: 1260 [I.V.:240; TPN:1020] Out: 1940 [Urine:1550; Emesis/NG output:390] This shift:     Bowel function:  Flatus: NO  BM: NO  Drain: Thin bilious  Physical Exam:  General: Pt awake/alert/oriented x4 in mild distress.  Tired Eyes: PERRL, normal EOM.  Sclera clear.  No icterus Neuro: CN II-XII intact w/o focal sensory/motor deficits. Lymph: No head/neck/groin lymphadenopathy Psych:  No delerium/psychosis/paranoia.  Depressed.  Anxious but consolable HENT: Normocephalic, Mucus membranes moist.   No thrush.  NGT in place Neck: Supple, No tracheal deviation Chest: No chest wall pain w good excursion.  Clear to auscultation bilaterally.  Some decreased breath sounds at bases and respiratory effort fair.  Uses IS .  No wheezes rales or rhonchi CV:  Pulses intact.  Regular rhythm MS: Normal AROM mjr joints.  No obvious deformity Abdomen: Soft.  Obese.  Nondistended.  Less sensitive at incision.  No incarcerated hernias. Ext:  SCDs BLE.  No mjr edema.  No cyanosis Skin: No petechiae / purpura   Problem List:   Principal Problem:   SBO (small bowel obstruction) Active Problems:   Chronic pain syndrome   Anxiety state, unspecified   DEPRESSION, CHRONIC   Chronic constipation   PTSD (post-traumatic stress disorder)   Palliative care encounter   Abdominal pain, unspecified site  SURGERY 07/29/2013 POST-OPERATIVE DIAGNOSIS: recurrent abdominal pain,extensive adhesions   PROCEDURE: Procedure(s):  diagnostic laporoscopy  LYSIS OF ADHESIONS  serosal repair  SURGEON: Surgeon(s):  Ardeth Sportsman, MD  Kandis Cocking, MD - Asst      Assessment  [name]  42 y.o. female  3 Days Post-Op    Partial small bowel obstruction in patient with chronic pain on chronic narcotics s/p significant adhesiolysis & repeat exlap with SB resection  Plan:  Off her Atrovent nebs for breathing.  Check chest x-ray and EKG to rule out anything more significant.  -Continue IV fluid hydration.  TNA  -Continue nasogastric tube until postoperative ileus resolved.  Lower nasogastric tube output with soft abdomen a hopeful sign.  Consider clamping  -Dilaudid PCA.  Ketorolac/robaxin  RTC.  Discussed with nurse.  She is going to replace/troubleshoot the carbon dioxide monitor  -PRN nausea control.   -palliate sore throat  -VTE prophylaxis- SCDs, etc  -mobilize as tolerated to help recovery.  The more she can walk, the better.  -?control depression.  Difficult with ileus/SBO.  D/w  Palliative Care team in past.  She needs to get back to her chronic pain specialist at [facility] at some point, but this is not a great time to radically change her regimen if possible.     -I updated the patient's status to the patient & her son.  Recommendations were made.  Questions were answered.  They expressed understanding & appreciation.    Ardeth Sportsman, M.D., F.A.C.S. Gastrointestinal and Minimally Invasive Surgery [facility] [address] [place], [place] [address] [phone] Main / Paging   08/19/2013   Results:   Labs: Results for orders placed during the hospital encounter of 08/08/13 (from the past 48 hour(s))  GLUCOSE, CAPILLARY     Status: Abnormal   Collection Time    08/17/13  7:59 AM      Result Value Range   Glucose-Capillary 258 (*) 70 - 99 mg/dL  CBC WITH DIFFERENTIAL     Status: Abnormal   Collection Time    08/17/13  9:20 AM      Result Value Range   WBC 23.6 (*) 4.0 - 10.5 K/uL   RBC 3.84 (*) 3.87 - 5.11 MIL/uL   Hemoglobin 12.0  12.0 - 15.0 g/dL   HCT 29.5 (*) 28.4 - 13.2 %   MCV 91.1  78.0 - 100.0 fL   MCH 31.3  26.0 - 34.0 pg   MCHC 34.3  30.0 - 36.0 g/dL   RDW 44.0  10.2 - 72.5 %   Platelets 360  150 - 400 K/uL   Neutrophils Relative % 92 (*) 43 - 77 %   Neutro Abs 21.8 (*) 1.7 - 7.7 K/uL   Lymphocytes Relative 2 (*) 12 - 46 %   Lymphs Abs 0.5 (*) 0.7 - 4.0 K/uL   Monocytes Relative 6  3 - 12 %   Monocytes Absolute 1.3 (*) 0.1 - 1.0 K/uL   Eosinophils Relative 0  0 - 5 %   Eosinophils Absolute 0.0  0.0 - 0.7 K/uL   Basophils Relative 0  0 - 1 %   Basophils Absolute 0.0  0.0 - 0.1 K/uL  TROPONIN I     Status: None   Collection Time    08/17/13  9:20 AM      Result Value Range   Troponin I <0.30  <0.30 ng/mL   Comment:            Due to the release kinetics of cTnI,     a negative result within the first hours     of the onset of symptoms does not rule out     myocardial infarction with  certainty.     If myocardial infarction is still suspected,     repeat the test at appropriate intervals.  CK TOTAL AND CKMB     Status: None   Collection Time    08/17/13  9:20 AM      Result Value Range   Total CK 52  7 - 177 U/L   CK, MB 2.2  0.3 - 4.0 ng/mL   Relative Index RELATIVE INDEX IS INVALID  0.0 - 2.5   Comment: WHEN CK <  100 U/L             GLUCOSE, CAPILLARY     Status: Abnormal   Collection Time    08/17/13  3:55 PM      Result Value Range   Glucose-Capillary 158 (*) 70 - 99 mg/dL   Comment 1 Notify RN    GLUCOSE, CAPILLARY     Status: Abnormal   Collection Time    08/17/13 11:42 PM      Result Value Range   Glucose-Capillary 134 (*) 70 - 99 mg/dL  GLUCOSE, CAPILLARY     Status: Abnormal   Collection Time    08/18/13  3:19 AM      Result Value Range   Glucose-Capillary 128 (*) 70 - 99 mg/dL  GLUCOSE, CAPILLARY     Status: Abnormal   Collection Time    08/18/13  7:37 AM      Result Value Range   Glucose-Capillary 126 (*) 70 - 99 mg/dL  BASIC METABOLIC PANEL     Status: Abnormal   Collection Time    08/18/13  8:25 AM      Result Value Range   Sodium 132 (*) 135 - 145 mEq/L   Potassium 4.1  3.5 - 5.1 mEq/L   Chloride 98  96 - 112 mEq/L   CO2 23  19 - 32 mEq/L   Glucose, Bld 141 (*) 70 - 99 mg/dL   BUN 23  6 - 23 mg/dL   Creatinine, Ser 2.13  0.50 - 1.10 mg/dL   Calcium 9.5  8.4 - 08.6 mg/dL   GFR calc non Af Amer 71 (*) >90 mL/min   GFR calc Af Amer 82 (*) >90 mL/min   Comment: (NOTE)     The eGFR has been calculated using the CKD EPI equation.     This calculation has not been validated in all clinical situations.     eGFR's persistently <90 mL/min signify possible Chronic Kidney     Disease.  PHOSPHORUS     Status: Abnormal   Collection Time    08/18/13  8:25 AM      Result Value Range   Phosphorus 4.9 (*) 2.3 - 4.6 mg/dL  MAGNESIUM     Status: None   Collection Time    08/18/13  8:25 AM      Result Value Range   Magnesium 2.1  1.5 - 2.5  mg/dL  GLUCOSE, CAPILLARY     Status: Abnormal   Collection Time    08/18/13  4:10 PM      Result Value Range   Glucose-Capillary 141 (*) 70 - 99 mg/dL  GLUCOSE, CAPILLARY     Status: Abnormal   Collection Time    08/18/13 11:53 PM      Result Value Range   Glucose-Capillary 129 (*) 70 - 99 mg/dL  COMPREHENSIVE METABOLIC PANEL     Status: Abnormal   Collection Time    08/19/13  5:35 AM      Result Value Range   Sodium 134 (*) 135 - 145 mEq/L   Potassium 3.9  3.5 - 5.1 mEq/L   Chloride 102  96 - 112 mEq/L   CO2 23  19 - 32 mEq/L   Glucose, Bld 139 (*) 70 - 99 mg/dL   BUN 23  6 - 23 mg/dL   Creatinine, Ser 5.78  0.50 - 1.10 mg/dL   Calcium 9.0  8.4 - 46.9 mg/dL   Total Protein 6.4  6.0 - 8.3 g/dL  Albumin 2.6 (*) 3.5 - 5.2 g/dL   AST 7  0 - 37 U/L   ALT 9  0 - 35 U/L   Alkaline Phosphatase 63  39 - 117 U/L   Total Bilirubin 0.3  0.3 - 1.2 mg/dL   GFR calc non Af Amer 64 (*) >90 mL/min   GFR calc Af Amer 74 (*) >90 mL/min   Comment: (NOTE)     The eGFR has been calculated using the CKD EPI equation.     This calculation has not been validated in all clinical situations.     eGFR's persistently <90 mL/min signify possible Chronic Kidney     Disease.  MAGNESIUM     Status: None   Collection Time    08/19/13  5:35 AM      Result Value Range   Magnesium 2.2  1.5 - 2.5 mg/dL  PHOSPHORUS     Status: Abnormal   Collection Time    08/19/13  5:35 AM      Result Value Range   Phosphorus 4.7 (*) 2.3 - 4.6 mg/dL  CBC     Status: Abnormal   Collection Time    08/19/13  5:35 AM      Result Value Range   WBC 21.2 (*) 4.0 - 10.5 K/uL   RBC 2.95 (*) 3.87 - 5.11 MIL/uL   Hemoglobin 9.2 (*) 12.0 - 15.0 g/dL   Comment: DELTA CHECK NOTED     REPEATED TO VERIFY   HCT 27.5 (*) 36.0 - 46.0 %   MCV 93.2  78.0 - 100.0 fL   MCH 31.2  26.0 - 34.0 pg   MCHC 33.5  30.0 - 36.0 g/dL   RDW 29.5  62.1 - 30.8 %   Platelets 265  150 - 400 K/uL   Comment: DELTA CHECK NOTED     REPEATED TO  VERIFY     SPECIMEN CHECKED FOR CLOTS    Imaging / Studies: Dg Chest Port 1 View  08/17/2013   CLINICAL DATA:  Chest pain  EXAM: PORTABLE CHEST - 1 VIEW  COMPARISON:  Chest x-ray of 08/14/2013  FINDINGS: No infiltrate or effusion is seen. There is mild peribronchial thickening present which may indicate bronchitis. A right PICC line is present with the tip seen to the region of the lower SVC, overlapping the neuro stimulator electrode leads. Mediastinal contours are stable. Mild cardiomegaly is stable.  IMPRESSION: No pneumonia. Peribronchial thickening may indicate bronchitis.   Electronically Signed   By: Dwyane Dee M.D.   On: 08/17/2013 09:22    Medications / Allergies: per chart  Antibiotics: Anti-infectives   Start     Dose/Rate Route Frequency Ordered Stop   08/17/13 0600  clindamycin (CLEOCIN) IVPB 600 mg  Status:  Discontinued    Comments:  Pharmacy may adjust dosing strength, interval, or rate of medication as needed for optimal therapy for the patient Send with patient on call to the OR.  Anesthesia to complete antibiotic administration <96min prior to incision per [facility].   600 mg 100 mL/hr over 30 Minutes Intravenous On call to O.R. 08/16/13 1720 08/17/13 0740   08/17/13 0600  gentamicin (GARAMYCIN) IVPB 100 mg  Status:  Discontinued    Comments:  Pharmacy may adjust dosing strength, schedule, rate of infusion, etc as needed to optimize therapy Send with patient on call to the OR.  Anesthesia to complete antibiotic administration <75min prior to incision per [name].   100 mg 200 mL/hr over 30  Minutes Intravenous On call to O.R. 08/16/13 1720 08/16/13 1732   08/16/13 1732  gentamicin (GARAMYCIN) 370 mg in dextrose 5 % 100 mL IVPB     5 mg/kg  74.8 kg 218.5 mL/hr over 30 Minutes Intravenous 30 min pre-op 08/16/13 1731 08/16/13 1836

## 2013-08-19 NOTE — Progress Notes (Signed)
Patient complained of burning in incision when she coughed. Temp 98.1. Notified MD.

## 2013-08-19 NOTE — Progress Notes (Signed)
NUTRITION FOLLOW UP  Intervention:   - TPN per pharmacy - Recommend nursing get updated weight - Diet advancement per MD - Will continue to monitor  Nutrition Dx:   Inadequate oral intake related to inability to eat as evidenced by NPO - ongoing   Goal:   TPN to meet >90% of estimated nutritional needs - met   Monitor:   Weights, labs, NGT output, BM, TPN, diet advancement  Assessment:   Pt with recent laparoscopic surgery 07/30/13, admitted with abdominal pain, bloating, nausea, and vomiting. Eating was making the pain worse PTA. Was unable to keep liquids down and had not tried foods. Pt on TPN. Pt with partial small bowel obstruction.   Pt being followed by palliative care. NGT d/c 10/11. Pt had laparoscopy with lysis of adhesions and small bowel resection 10/13 and had NGT placed. Noted rapid response 10/14 r/t chest pain. No new weights. Pt with abdominal distention.   Met with pt who denies any nausea, says she has been up walking the halls. NGT with total output yesterday.   TPN: Clinimix-E 5/15 at 75 mL/hr + Lipids 20% 24mL/hr daily provides 90 grams protein and 1758 kcal per day meeting 106% of previously estimated calorie needs, 106% of previously estimated protein needs.   - Pt with slightly low sodium - Phosphorus elevated - CBGs < 150 mg/dL  CBG (last 3)   Recent Labs  08/18/13 0737 08/18/13 1610 08/18/13 2353  GLUCAP 126* 141* 129*     Height: Ht Readings from Last 1 Encounters:  08/08/13 5\' 6"  (1.676 m)    Weight Status:   Wt Readings from Last 1 Encounters:  08/08/13 164 lb 14.5 oz (74.8 kg)    Re-estimated needs:  Kcal: 1750-1950 Protein: 90-105g  Fluid: 1.7-1.9L/day   Skin: Abdominal incision   Diet Order: NPO   Intake/Output Summary (Last 24 hours) at 08/19/13 1436 Last data filed at 08/19/13 1400  Gross per 24 hour  Intake    820 ml  Output   1690 ml  Net   -870 ml    Last BM: 10/13   Labs:   Recent Labs Lab  08/16/13 0510 08/17/13 0440 08/18/13 0825 08/19/13 0535  NA 139 133* 132* 134*  K 3.5 4.0 4.1 3.9  CL 103 100 98 102  CO2 24 21 23 23   BUN 20 19 23 23   CREATININE 0.84 0.76 0.96 1.05  CALCIUM 9.4 9.4 9.5 9.0  MG 2.0  --  2.1 2.2  PHOS 4.5 2.8 4.9* 4.7*  GLUCOSE 118* 270* 141* 139*    CBG (last 3)   Recent Labs  08/18/13 0737 08/18/13 1610 08/18/13 2353  GLUCAP 126* 141* 129*    Scheduled Meds: . budesonide-formoterol  2 puff Inhalation BID  . fentaNYL  50 mcg Transdermal Q72H  . fluticasone  2 spray Each Nare Daily  . heparin  5,000 Units Subcutaneous Q8H  . HYDROmorphone PCA 0.3 mg/mL   Intravenous Q4H  . insulin aspart  0-15 Units Subcutaneous Q8H  . ketorolac  15-30 mg Intravenous Q6H  . lip balm  1 application Topical BID  . magic mouthwash  15 mL Oral QID  . methocarbamol (ROBAXIN) IV  500 mg Intravenous Q8H  . metoprolol  5 mg Intravenous Q6H  . olopatadine  1 drop Both Eyes BID    Continuous Infusions: . dextrose 5 % and 0.45 % NaCl with KCl 20 mEq/L 20 mL/hr (08/18/13 2217)  . Marland KitchenTPN (CLINIMIX-E) Adult 75 mL/hr at  08/18/13 1814   And  . fat emulsion 250 mL (08/18/13 1815)  . Marland KitchenTPN (CLINIMIX-E) Adult     And  . fat emulsion       Levon Hedger MS, RD, LDN [phone] Pager [phone] After Hours Pager

## 2013-08-19 NOTE — Progress Notes (Signed)
PARENTERAL NUTRITION CONSULT NOTE - FOLLOW UP  Pharmacy Consult for TNA Indication: Bowel obstruction  Allergies  Allergen Reactions  . Neurontin [Gabapentin] Other (See Comments)    Other reaction(s): Mental Status Changes (intolerance) hallucinations Sleep walking  . Amoxicillin Hives  . Penicillins Hives  . Latex Rash   Patient Measurements: Height: 5\' 6"  (167.6 cm) Weight: 164 lb 14.5 oz (74.8 kg) IBW/kg (Calculated) : 59.3 Adjusted Body Weight: 64kg Usual Weight: 75kg  Vital Signs: BP: 100/60 mmHg (10/16 0901) Intake/Output from previous day: 10/15 0701 - 10/16 0700 In: 1260 [I.V.:240; TPN:1020] Out: 1940 [Urine:1550; Emesis/NG output:390] Intake/Output from this shift:    Labs:  Recent Labs  08/17/13 0920 08/19/13 0535  WBC 23.6* 21.2*  HGB 12.0 9.2*  HCT 35.0* 27.5*  PLT 360 265    Recent Labs  08/17/13 0440 08/18/13 0825 08/19/13 0535  NA 133* 132* 134*  K 4.0 4.1 3.9  CL 100 98 102  CO2 21 23 23   GLUCOSE 270* 141* 139*  BUN 19 23 23   CREATININE 0.76 0.96 1.05  CALCIUM 9.4 9.5 9.0  MG  --  2.1 2.2  PHOS 2.8 4.9* 4.7*  PROT  --   --  6.4  ALBUMIN  --   --  2.6*  AST  --   --  7  ALT  --   --  9  ALKPHOS  --   --  63  BILITOT  --   --  0.3  Corrected calcium: 9.42 (10/10)  Estimated Creatinine Clearance: 70.7 ml/min (by C-G formula based on Cr of 1.05).   Recent Labs  08/18/13 0737 08/18/13 1610 08/18/13 2353  GLUCAP 126* 141* 129*   Medications:  Scheduled:  . budesonide-formoterol  2 puff Inhalation BID  . fentaNYL  50 mcg Transdermal Q72H  . fluticasone  2 spray Each Nare Daily  . heparin  5,000 Units Subcutaneous Q8H  . HYDROmorphone PCA 0.3 mg/mL   Intravenous Q4H  . insulin aspart  0-15 Units Subcutaneous Q8H  . ketorolac  15-30 mg Intravenous Q6H  . lip balm  1 application Topical BID  . magic mouthwash  15 mL Oral QID  . methocarbamol (ROBAXIN) IV  500 mg Intravenous Q8H  . metoprolol  5 mg Intravenous Q6H  .  olopatadine  1 drop Both Eyes BID   Infusions:  . dextrose 5 % and 0.45 % NaCl with KCl 20 mEq/L 20 mL/hr (08/18/13 2217)  . Marland KitchenTPN (CLINIMIX-E) Adult 75 mL/hr at 08/18/13 1814   And  . fat emulsion 250 mL (08/18/13 1815)   Insulin Requirements in the past 24 hours:  5 units - SSI moderate scale q8h   IVF: D5 1/2NS w/ 20KCl at 10-20 ml/hr  Current Nutrition:  NPO except ice chips post-op, NG tube remains, removed previously 10/11  Clinimix E 5/15 at 75 ml/hr Lipids 20% at 10 ml/hr  Nutritional Goals:  Per RD 10/9: Kcal: 1650-1750  Protein: 85-95g  Fluid: 1.6-1.7L/day  Clinimix-E 5/15 at 75 mL/hr + Lipids 20% 39mL/hr daily will provide 90 grams protein and 1758 kcal per day  Assessment: 42 y/o F s/p enterolysis of adhesions 07/29/13, presented to ED 08/08/13 with small bowel obstruction. TNA initiated 10/9 -Expl lap to open lap 10/13: small bowel resection due to inflammatory mass of mesentery, LOA  -10/15 Ex lap/SB resection/lysis of adhesions - Continue NG tube until post-operative ileus resolves  Labs:  Electrolytes: BMET today okay.  Mg/Phos okay    LFTs: wnl (10/16)  TGs: normal, 94 (10/10), 112 (10/13)  Prealbumin: low, 13.3 (10/10), 18.7 (10/13)  CBG's - WNL with SSI coverage   TPN Access: Double lumen PICC TPN day#: 9  Plan: At 1800 today:  Continue Clinimix E 5/15 at goal 75 ml/hr  Continue 20% fat emulsion at 55ml/hr daily  TNA to contain standard multivitamins and trace elements daily  TNA lab panels on Mondays & Thursdays  F/u daily for TNA tolerance, diet advancement  Continue Moderate SSI Q8h  Continue M[facility]F at [facility] PharmD Pager #: [phone] 9:40 AM 08/19/2013

## 2013-08-20 LAB — CBC
Hemoglobin: 8.9 g/dL — ABNORMAL LOW (ref 12.0–15.0)
MCH: 30.5 pg (ref 26.0–34.0)
MCHC: 33.1 g/dL (ref 30.0–36.0)
MCV: 92.1 fL (ref 78.0–100.0)
Platelets: 311 10*3/uL (ref 150–400)
RBC: 2.92 MIL/uL — ABNORMAL LOW (ref 3.87–5.11)
RDW: 13.3 % (ref 11.5–15.5)

## 2013-08-20 LAB — GLUCOSE, CAPILLARY: Glucose-Capillary: 124 mg/dL — ABNORMAL HIGH (ref 70–99)

## 2013-08-20 MED ORDER — IPRATROPIUM BROMIDE 0.02 % IN SOLN
500.0000 ug | Freq: Two times a day (BID) | RESPIRATORY_TRACT | Status: DC
  Administered 2013-08-21 – 2013-08-25 (×9): 500 ug via RESPIRATORY_TRACT
  Filled 2013-08-20 (×9): qty 2.5

## 2013-08-20 MED ORDER — FAT EMULSION 20 % IV EMUL
250.0000 mL | INTRAVENOUS | Status: AC
  Administered 2013-08-20: 250 mL via INTRAVENOUS
  Filled 2013-08-20: qty 250

## 2013-08-20 MED ORDER — TRACE MINERALS CR-CU-F-FE-I-MN-MO-SE-ZN IV SOLN
INTRAVENOUS | Status: AC
  Administered 2013-08-20: 17:00:00 via INTRAVENOUS
  Filled 2013-08-20: qty 2000

## 2013-08-20 MED ORDER — IPRATROPIUM BROMIDE 0.02 % IN SOLN
0.5000 mg | Freq: Four times a day (QID) | RESPIRATORY_TRACT | Status: DC | PRN
  Administered 2013-08-26 – 2013-08-27 (×3): 0.5 mg via RESPIRATORY_TRACT
  Filled 2013-08-20 (×4): qty 2.5

## 2013-08-20 MED ORDER — ALBUTEROL SULFATE (5 MG/ML) 0.5% IN NEBU
2.5000 mg | INHALATION_SOLUTION | Freq: Two times a day (BID) | RESPIRATORY_TRACT | Status: DC
  Administered 2013-08-21 – 2013-08-22 (×4): 2.5 mg via RESPIRATORY_TRACT
  Filled 2013-08-20 (×4): qty 0.5

## 2013-08-20 MED ORDER — ALBUTEROL SULFATE (5 MG/ML) 0.5% IN NEBU
2.5000 mg | INHALATION_SOLUTION | Freq: Four times a day (QID) | RESPIRATORY_TRACT | Status: DC | PRN
  Administered 2013-08-23 – 2013-08-27 (×8): 2.5 mg via RESPIRATORY_TRACT
  Filled 2013-08-20 (×9): qty 0.5

## 2013-08-20 NOTE — Progress Notes (Signed)
Patient states she uses albuterol and atrovent nebulizers at home 2-3 times every day. She is requesting that she be placed on a scheduled regimen for her remaining stay this admission. NAD with stable VS. BBS cta. CXR wnl. RT treatment assessment protocol completed. Orders modified accordingly.

## 2013-08-20 NOTE — Progress Notes (Signed)
PARENTERAL NUTRITION CONSULT NOTE - FOLLOW UP  Pharmacy Consult for TNA Indication: Bowel obstruction  Allergies  Allergen Reactions  . Neurontin [Gabapentin] Other (See Comments)    Other reaction(s): Mental Status Changes (intolerance) hallucinations Sleep walking  . Amoxicillin Hives  . Penicillins Hives  . Latex Rash   Patient Measurements: Height: 5\' 6"  (167.6 cm) Weight: 164 lb 14.5 oz (74.8 kg) IBW/kg (Calculated) : 59.3 Adjusted Body Weight: 64kg Usual Weight: 75kg  Vital Signs: Temp: 99.4 F (37.4 C) (10/17 0540) Temp src: Oral (10/17 0540) BP: 122/77 mmHg (10/17 0540) Pulse Rate: 93 (10/17 0540) Intake/Output from previous day: 10/16 0701 - 10/17 0700 In: 400 [I.V.:400] Out: 2075 [Urine:1050; Emesis/NG output:1025] Intake/Output from this shift:    Labs:  Recent Labs  08/17/13 0920 08/19/13 0535 08/20/13 0500  WBC 23.6* 21.2* 16.3*  HGB 12.0 9.2* 8.9*  HCT 35.0* 27.5* 26.9*  PLT 360 265 311    Recent Labs  08/18/13 0825 08/19/13 0535  NA 132* 134*  K 4.1 3.9  CL 98 102  CO2 23 23  GLUCOSE 141* 139*  BUN 23 23  CREATININE 0.96 1.05  CALCIUM 9.5 9.0  MG 2.1 2.2  PHOS 4.9* 4.7*  PROT  --  6.4  ALBUMIN  --  2.6*  AST  --  7  ALT  --  9  ALKPHOS  --  63  BILITOT  --  0.3  Corrected calcium: 9.42 (10/10)  Estimated Creatinine Clearance: 70.7 ml/min (by C-G formula based on Cr of 1.05).   Recent Labs  08/19/13 0731 08/19/13 1621 08/19/13 2348  GLUCAP 121* 126* 121*   Medications:  Scheduled:  . budesonide-formoterol  2 puff Inhalation BID  . fentaNYL  50 mcg Transdermal Q72H  . fluticasone  2 spray Each Nare Daily  . heparin  5,000 Units Subcutaneous Q8H  . HYDROmorphone PCA 0.3 mg/mL   Intravenous Q4H  . insulin aspart  0-15 Units Subcutaneous Q8H  . lip balm  1 application Topical BID  . magic mouthwash  15 mL Oral QID  . methocarbamol (ROBAXIN) IV  500 mg Intravenous Q8H  . metoprolol  5 mg Intravenous Q6H  . olopatadine   1 drop Both Eyes BID   Infusions:  . dextrose 5 % and 0.45 % NaCl with KCl 20 mEq/L 20 mL/hr (08/18/13 2217)  . Marland KitchenTPN (CLINIMIX-E) Adult 75 mL/hr at 08/19/13 1731   And  . fat emulsion 250 mL (08/19/13 1732)   Insulin Requirements in the past 24 hours:  6 units - SSI moderate scale q8h   IVF: D5 1/2NS w/ 20KCl at 10-20 ml/hr  Current Nutrition:  NPO except ice chips post-op, NG tube remains, removed previously 10/11  Clinimix E 5/15 at 75 ml/hr Lipids 20% at 10 ml/hr  Nutritional Goals:  Re-estimated per RD on 10/16: Kcal: 1750-1950  Protein: 90-105g  Fluid: 1.7-1.9L/day   ClinimixE 5/15 at 80 mL/hr + Lipids 20% 27mL/hr = 96g protein, 1843 kcal per day  Assessment: 42 y/o F s/p enterolysis of adhesions 07/29/13, presented to ED 08/08/13 with small bowel obstruction. TNA initiated 10/9 -Expl lap to open lap 10/13: small bowel resection due to inflammatory mass of mesentery, LOA  -10/15 Ex lap/SB resection/lysis of adhesions - Continue NG tube until post-operative ileus resolves  Labs:  Electrolytes: wnl on 10/16  LFTs: wnl (10/16)  TGs: normal, 94 (10/10), 112 (10/13)  Prealbumin: low, 13.3 (10/10), 18.7 (10/13)  CBG's - WNL with SSI coverage   TPN Access:  Double lumen PICC TPN day#: 10  Plan: At 1800 today:  Increase Clinimix E 5/15 to new goal of 80 ml/hr  Continue 20% fat emulsion at 76ml/hr daily  TNA to contain standard multivitamins and trace elements daily  TNA lab panels on Mondays & Thursdays  F/u daily for TNA tolerance, diet advancement  Continue Moderate SSI Q8h  Continue MIVF at [facility], PharmD, BCPS Pager: [phone] 08/20/2013 7:22 AM

## 2013-08-20 NOTE — Progress Notes (Addendum)
Jane English [id number] 06/21/1971  CARE TEAM:  PCP: Syliva Overman, MD  Outpatient Care Team: Patient Care Team: Kerri Perches, MD as PCP - General Ardeth Sportsman, MD as Consulting Physician (General Surgery) Karn Cassis, MD as Consulting Physician (Neurosurgery) Beverley Fiedler, MD as Consulting Physician (Gastroenterology)  Inpatient Treatment Team: Treatment Team: Attending Provider: Ardeth Sportsman, MD; Technician: Vella Raring, NT; Registered Nurse: Tristan Schroeder, RN; Consulting Physician: Palliative Doristine Johns; Dietitian: Lavena Bullion, RD; Registered Nurse: Lattie Haw, RN; Technician: Oren Bracket [place], NT; Technician: Valetta Close, [place]; Registered Nurse: Romeo Rabon, RN; Registered Nurse: Cathie Olden, RN   Subjective:  Had episode of chest pain.  Workup negative.  Minimal chest discomfort now. Using PCA a lot.  C/o burning at incision, pain w cough, intermittent chest pain with cough/turning Anxious Walking a lot in hallways   Objective:  Vital signs:  Filed Vitals:   08/20/13 0100 08/20/13 0210 08/20/13 0430 08/20/13 0540  BP:  109/57  122/77  Pulse:  92  93  Temp:    99.4 F (37.4 C)  TempSrc:    Oral  Resp: 20  27 20   Height:      Weight:      SpO2: 100%  100% 100%    Last BM Date: 08/16/13  Intake/Output   Yesterday:  10/16 0701 - 10/17 0700 In: 400 [I.V.:400] Out: 2075 [Urine:1050; Emesis/NG output:1025] This shift:     Bowel function:  Flatus: NO  BM: NO  Drain: Thin bilious  Physical Exam:  General: Pt awake/alert/oriented x4 in mild distress.  Alert Eyes: PERRL, normal EOM.  Sclera clear.  No icterus Neuro: CN II-XII intact w/o focal sensory/motor deficits. Lymph: No head/neck/groin lymphadenopathy Psych:  No delerium/psychosis/paranoia.  Anxious but consolable.  Tearful HENT: Normocephalic, Mucus membranes moist.  No thrush.  NGT in place Neck: Supple, No tracheal  deviation Chest: No chest wall pain w good excursion CV:  Pulses intact.  Regular rhythm MS: Normal AROM mjr joints.  No obvious deformity Abdomen: Soft.  Obese.  Nondistended.  Sensitive at incision/OnQ - grabs my hand in anticipation of pain, but eventually relents & confesses to mild pain with palpation.  Incisions clean.  No incarcerated hernias.  I removed the OnQ pump  Ext:  SCDs BLE.  No mjr edema.  No cyanosis Skin: No petechiae / purpura   Problem List:   Principal Problem:   SBO (small bowel obstruction) Active Problems:   Chronic pain syndrome   Anxiety state, unspecified   DEPRESSION, CHRONIC   Chronic constipation   PTSD (post-traumatic stress disorder)   Palliative care encounter   Abdominal pain, unspecified site  SURGERY 07/29/2013 POST-OPERATIVE DIAGNOSIS: recurrent abdominal pain,extensive adhesions   PROCEDURE: Procedure(s):  diagnostic laporoscopy  LYSIS OF ADHESIONS  serosal repair  SURGEON: Surgeon(s):  Ardeth Sportsman, MD  Kandis Cocking, MD - Asst      Assessment  Jane English  42 y.o. female  4 Days Post-Op    Partial small bowel obstruction in patient with chronic pain on chronic narcotics s/p significant adhesiolysis & repeat exlap with SB resection  Plan:  -Continue IV fluid hydration.  TNA  -Continue nasogastric tube until postoperative ileus resolved.  Lower nasogastric tube output with soft abdomen a hopeful sign.  -Dilaudid PCA.  Ketorolac/robaxin/ice/heat RTC.  Refused fentanyl patch  -PRN nausea control.   -palliate sore throat  -VTE prophylaxis- SCDs, etc  -mobilize as tolerated to  help recovery.  The more she can walk, the better.  -?control depression.  Difficult with ileus/SBO.  D/w Palliative Care team in past.  She needs to get back to her chronic pain specialist at [facility] at some point, but this is not a great time to radically change her regimen if possible.     -I updated the patient's status to the patient &  her husband.  Recommendations were made.  Questions were answered.  They expressed understanding & appreciation.    Ardeth Sportsman, M.D., F.A.C.S. Gastrointestinal and Minimally Invasive Surgery [facility] [address] [place], [place] [address] [phone] Main / Paging   08/20/2013   Results:   Labs: Results for orders placed during the hospital encounter of 08/08/13 (from the past 48 hour(s))  GLUCOSE, CAPILLARY     Status: Abnormal   Collection Time    08/18/13  7:37 AM      Result Value Range   Glucose-Capillary 126 (*) 70 - 99 mg/dL  BASIC METABOLIC PANEL     Status: Abnormal   Collection Time    08/18/13  8:25 AM      Result Value Range   Sodium 132 (*) 135 - 145 mEq/L   Potassium 4.1  3.5 - 5.1 mEq/L   Chloride 98  96 - 112 mEq/L   CO2 23  19 - 32 mEq/L   Glucose, Bld 141 (*) 70 - 99 mg/dL   BUN 23  6 - 23 mg/dL   Creatinine, Ser 8.29  0.50 - 1.10 mg/dL   Calcium 9.5  8.4 - 56.2 mg/dL   GFR calc non Af Amer 71 (*) >90 mL/min   GFR calc Af Amer 82 (*) >90 mL/min   Comment: (NOTE)     The eGFR has been calculated using the CKD EPI equation.     This calculation has not been validated in all clinical situations.     eGFR's persistently <90 mL/min signify possible Chronic Kidney     Disease.  PHOSPHORUS     Status: Abnormal   Collection Time    08/18/13  8:25 AM      Result Value Range   Phosphorus 4.9 (*) 2.3 - 4.6 mg/dL  MAGNESIUM     Status: None   Collection Time    08/18/13  8:25 AM      Result Value Range   Magnesium 2.1  1.5 - 2.5 mg/dL  GLUCOSE, CAPILLARY     Status: Abnormal   Collection Time    08/18/13  4:10 PM      Result Value Range   Glucose-Capillary 141 (*) 70 - 99 mg/dL  GLUCOSE, CAPILLARY     Status: Abnormal   Collection Time    08/18/13 11:53 PM      Result Value Range   Glucose-Capillary 129 (*) 70 - 99 mg/dL  COMPREHENSIVE METABOLIC PANEL     Status: Abnormal   Collection Time     08/19/13  5:35 AM      Result Value Range   Sodium 134 (*) 135 - 145 mEq/L   Potassium 3.9  3.5 - 5.1 mEq/L   Chloride 102  96 - 112 mEq/L   CO2 23  19 - 32 mEq/L   Glucose, Bld 139 (*) 70 - 99 mg/dL   BUN 23  6 - 23 mg/dL   Creatinine, Ser 1.30  0.50 - 1.10 mg/dL   Calcium 9.0  8.4 - 86.5 mg/dL  Total Protein 6.4  6.0 - 8.3 g/dL   Albumin 2.6 (*) 3.5 - 5.2 g/dL   AST 7  0 - 37 U/L   ALT 9  0 - 35 U/L   Alkaline Phosphatase 63  39 - 117 U/L   Total Bilirubin 0.3  0.3 - 1.2 mg/dL   GFR calc non Af Amer 64 (*) >90 mL/min   GFR calc Af Amer 74 (*) >90 mL/min   Comment: (NOTE)     The eGFR has been calculated using the CKD EPI equation.     This calculation has not been validated in all clinical situations.     eGFR's persistently <90 mL/min signify possible Chronic Kidney     Disease.  MAGNESIUM     Status: None   Collection Time    08/19/13  5:35 AM      Result Value Range   Magnesium 2.2  1.5 - 2.5 mg/dL  PHOSPHORUS     Status: Abnormal   Collection Time    08/19/13  5:35 AM      Result Value Range   Phosphorus 4.7 (*) 2.3 - 4.6 mg/dL  CBC     Status: Abnormal   Collection Time    08/19/13  5:35 AM      Result Value Range   WBC 21.2 (*) 4.0 - 10.5 K/uL   RBC 2.95 (*) 3.87 - 5.11 MIL/uL   Hemoglobin 9.2 (*) 12.0 - 15.0 g/dL   Comment: DELTA CHECK NOTED     REPEATED TO VERIFY   HCT 27.5 (*) 36.0 - 46.0 %   MCV 93.2  78.0 - 100.0 fL   MCH 31.2  26.0 - 34.0 pg   MCHC 33.5  30.0 - 36.0 g/dL   RDW 16.1  09.6 - 04.5 %   Platelets 265  150 - 400 K/uL   Comment: DELTA CHECK NOTED     REPEATED TO VERIFY     SPECIMEN CHECKED FOR CLOTS  GLUCOSE, CAPILLARY     Status: Abnormal   Collection Time    08/19/13  7:31 AM      Result Value Range   Glucose-Capillary 121 (*) 70 - 99 mg/dL  GLUCOSE, CAPILLARY     Status: Abnormal   Collection Time    08/19/13  4:21 PM      Result Value Range   Glucose-Capillary 126 (*) 70 - 99 mg/dL  GLUCOSE, CAPILLARY     Status: Abnormal    Collection Time    08/19/13 11:48 PM      Result Value Range   Glucose-Capillary 121 (*) 70 - 99 mg/dL  CBC     Status: Abnormal   Collection Time    08/20/13  5:00 AM      Result Value Range   WBC 16.3 (*) 4.0 - 10.5 K/uL   RBC 2.92 (*) 3.87 - 5.11 MIL/uL   Hemoglobin 8.9 (*) 12.0 - 15.0 g/dL   HCT 40.9 (*) 81.1 - 91.4 %   MCV 92.1  78.0 - 100.0 fL   MCH 30.5  26.0 - 34.0 pg   MCHC 33.1  30.0 - 36.0 g/dL   RDW 78.2  95.6 - 21.3 %   Platelets 311  150 - 400 K/uL    Imaging / Studies: Dg Chest Port 1 View  08/19/2013   CLINICAL DATA:  Right-sided chest pain and shortness of breath  EXAM: PORTABLE CHEST - 1 VIEW  COMPARISON:  None.  FINDINGS: The heart size and  mediastinal contours are within normal limits. Stable peribronchial thickening. Both lungs are clear. The visualized skeletal structures are unremarkable. Dorsal column stimulator mid thoracic position. Prior cervical fusion. PICC line tip cavoatrial junction.  IMPRESSION: No active disease.  No change from priors.  No pneumonia.   Electronically Signed   By: Davonna Belling M.D.   On: 08/19/2013 23:40    Medications / Allergies: per chart  Antibiotics: Anti-infectives   Start     Dose/Rate Route Frequency Ordered Stop   08/17/13 0600  clindamycin (CLEOCIN) IVPB 600 mg  Status:  Discontinued    Comments:  Pharmacy may adjust dosing strength, interval, or rate of medication as needed for optimal therapy for the patient Send with patient on call to the OR.  Anesthesia to complete antibiotic administration <39min prior to incision per [facility].   600 mg 100 mL/hr over 30 Minutes Intravenous On call to O.R. 08/16/13 1720 08/17/13 0740   08/17/13 0600  gentamicin (GARAMYCIN) IVPB 100 mg  Status:  Discontinued    Comments:  Pharmacy may adjust dosing strength, schedule, rate of infusion, etc as needed to optimize therapy Send with patient on call to the OR.  Anesthesia to complete antibiotic administration <3min prior to  incision per [facility].   100 mg 200 mL/hr over 30 Minutes Intravenous On call to O.R. 08/16/13 1720 08/16/13 1732   08/16/13 1732  gentamicin (GARAMYCIN) 370 mg in dextrose 5 % 100 mL IVPB     5 mg/kg  74.8 kg 218.5 mL/hr over 30 Minutes Intravenous 30 min pre-op 08/16/13 1731 08/16/13 1836

## 2013-08-21 DIAGNOSIS — E119 Type 2 diabetes mellitus without complications: Secondary | ICD-10-CM

## 2013-08-21 LAB — MAGNESIUM
Magnesium: 2.3 mg/dL (ref 1.5–2.5)
Magnesium: 1.9 mg/dL (ref 1.5–2.5)

## 2013-08-21 LAB — BASIC METABOLIC PANEL
BUN: 14 mg/dL (ref 6–23)
BUN: 15 mg/dL (ref 6–23)
CO2: 21 mEq/L (ref 19–32)
CO2: 24 mEq/L (ref 19–32)
Calcium: 8.9 mg/dL (ref 8.4–10.5)
Calcium: 9.4 mg/dL (ref 8.4–10.5)
Chloride: 101 mEq/L (ref 96–112)
Creatinine, Ser: 0.89 mg/dL (ref 0.50–1.10)
GFR calc Af Amer: >90 mL/min (ref >90)
GFR calc non Af Amer: >90 mL/min (ref >90)
Glucose, Bld: 153 mg/dL — ABNORMAL HIGH (ref 70–99)
Potassium: 3.8 mEq/L (ref 3.5–5.1)
Sodium: 135 mEq/L (ref 135–145)

## 2013-08-21 LAB — CBC
HCT: 25.2 % — ABNORMAL LOW (ref 36.0–46.0)
Hemoglobin: 8.5 g/dL — ABNORMAL LOW (ref 12.0–15.0)
MCV: 95.1 fL (ref 78.0–100.0)
Platelets: 308 10*3/uL (ref 150–400)
RBC: 2.65 MIL/uL — ABNORMAL LOW (ref 3.87–5.11)
WBC: 14.1 10*3/uL — ABNORMAL HIGH (ref 4.0–10.5)

## 2013-08-21 LAB — GLUCOSE, CAPILLARY
Glucose-Capillary: 129 mg/dL — ABNORMAL HIGH (ref 70–99)
Glucose-Capillary: 130 mg/dL — ABNORMAL HIGH (ref 70–99)
Glucose-Capillary: 147 mg/dL — ABNORMAL HIGH (ref 70–99)

## 2013-08-21 LAB — PHOSPHORUS: Phosphorus: 4.5 mg/dL (ref 2.3–4.6)

## 2013-08-21 MED ORDER — TRACE MINERALS CR-CU-F-FE-I-MN-MO-SE-ZN IV SOLN
INTRAVENOUS | Status: AC
  Administered 2013-08-21: 18:00:00 via INTRAVENOUS
  Filled 2013-08-21: qty 2000

## 2013-08-21 MED ORDER — FAT EMULSION 20 % IV EMUL
250.0000 mL | INTRAVENOUS | Status: AC
  Administered 2013-08-21: 250 mL via INTRAVENOUS
  Filled 2013-08-21: qty 250

## 2013-08-21 NOTE — Progress Notes (Signed)
PARENTERAL NUTRITION CONSULT NOTE - FOLLOW UP  Pharmacy Consult for TNA Indication: Bowel obstruction  Allergies  Allergen Reactions  . Neurontin [Gabapentin] Other (See Comments)    Other reaction(s): Mental Status Changes (intolerance) hallucinations Sleep walking  . Amoxicillin Hives  . Penicillins Hives  . Latex Rash   Patient Measurements: Height: 5\' 6"  (167.6 cm) Weight: 164 lb 14.5 oz (74.8 kg) IBW/kg (Calculated) : 59.3 Adjusted Body Weight: 64kg Usual Weight: 75kg  Vital Signs: Temp: 100.9 F (38.3 C) (10/18 0537) Temp src: Oral (10/18 0537) BP: 125/62 mmHg (10/18 0537) Pulse Rate: 104 (10/18 0537) Intake/Output from previous day: 10/17 0701 - 10/18 0700 In: 1620.3 [I.V.:468.3; IV Piggyback:165; TPN:987] Out: 1925 [Urine:1450; Emesis/NG output:475] Intake/Output from this shift:    Labs:  Recent Labs  08/19/13 0535 08/20/13 0500 08/21/13 0430  WBC 21.2* 16.3* 14.1*  HGB 9.2* 8.9* 8.5*  HCT 27.5* 26.9* 25.2*  PLT 265 311 308    Recent Labs  08/19/13 0535 08/21/13 0430 08/21/13 0646  NA 134* QUESTIONABLE RESULTS, RECOMMEND RECOLLECT TO VERIFY 135  K 3.9 QUESTIONABLE RESULTS, RECOMMEND RECOLLECT TO VERIFY 3.8  CL 102 QUESTIONABLE RESULTS, RECOMMEND RECOLLECT TO VERIFY 101  CO2 23 21 24   GLUCOSE 139* QUESTIONABLE RESULTS, RECOMMEND RECOLLECT TO VERIFY 153*  BUN 23 14 15   CREATININE 1.05 0.89 0.79  CALCIUM 9.0 8.9 9.4  MG 2.2 2.3 1.9  PHOS 4.7* 7.2* 4.5  PROT 6.4  --   --   ALBUMIN 2.6*  --   --   AST 7  --   --   ALT 9  --   --   ALKPHOS 63  --   --   BILITOT 0.3  --   --   Corrected calcium: 9.42 (10/10)  Estimated Creatinine Clearance: 92.8 ml/min (by C-G formula based on Cr of 0.79).   Recent Labs  08/20/13 0752 08/20/13 1611 08/21/13 0021  GLUCAP 151* 124* 130*   Medications:  Scheduled:  . albuterol  2.5 mg Nebulization BID  . budesonide-formoterol  2 puff Inhalation BID  . fentaNYL  50 mcg Transdermal Q72H  . fluticasone   2 spray Each Nare Daily  . heparin  5,000 Units Subcutaneous Q8H  . HYDROmorphone PCA 0.3 mg/mL   Intravenous Q4H  . insulin aspart  0-15 Units Subcutaneous Q8H  . ipratropium  500 mcg Nebulization BID  . lip balm  1 application Topical BID  . magic mouthwash  15 mL Oral QID  . methocarbamol (ROBAXIN) IV  500 mg Intravenous Q8H  . metoprolol  5 mg Intravenous Q6H  . olopatadine  1 drop Both Eyes BID   Infusions:  . dextrose 5 % and 0.45 % NaCl with KCl 20 mEq/L 20 mL/hr (08/18/13 2217)  . Marland KitchenTPN (CLINIMIX-E) Adult 80 mL/hr at 08/20/13 1724   And  . fat emulsion 250 mL (08/20/13 1723)   Insulin Requirements in the past 24 hours:  7 units - SSI moderate scale q8h   IVF: D5 1/2NS w/ 20KCl at 10-20 ml/hr  Current Nutrition:  NPO except ice chips post-op, NG tube remains, removed previously 10/11  Clinimix E 5/15 at 75 ml/hr Lipids 20% at 10 ml/hr  Nutritional Goals:  Re-estimated per RD on 10/16: Kcal: 1750-1950  Protein: 90-105g  Fluid: 1.7-1.9L/day   ClinimixE 5/15 at 80 mL/hr + Lipids 20% 7mL/hr = 96g protein, 1843 kcal per day  Assessment: 42 y/o F s/p enterolysis of adhesions 07/29/13, presented to ED 08/08/13 with small bowel obstruction. TNA  initiated 10/9 -Expl lap to open lap 10/13: small bowel resection due to inflammatory mass of mesentery, LOA  -10/15 Ex lap/SB resection/lysis of adhesions - Continue NG tube until post-operative ileus resolves  Labs:  Electrolytes: wnl on 10/18  LFTs: wnl (10/16)  TGs: normal, 94 (10/10), 112 (10/13)  Prealbumin: low, 13.3 (10/10), 18.7 (10/13)  CBG's - WNL with SSI coverage   TPN Access: Double lumen PICC TPN day#: 11  Plan: At 1800 today:  Continue Clinimix E 5/15 @ goal of 80 ml/hr  Continue 20% fat emulsion at 54ml/hr daily  TNA to contain standard multivitamins and trace elements daily  TNA lab panels on Mondays & Thursdays  F/u daily for TNA tolerance, diet advancement  Continue Moderate SSI  Q8h  Continue MIVF at [facility], PharmD, BCPS Pager [phone] 08/21/2013 8:07 AM

## 2013-08-21 NOTE — Progress Notes (Signed)
Patient ID: Jane English, female   DOB: 06/28/1971, 42 y.o.   MRN: [id number] [facility] Progress Note:   5 Days Post-Op  Subjective: Mental status is OK but patient is fairly uncommunicative using gestures and some comments Objective: Vital signs in last 24 hours: Temp:  [100.1 F (37.8 C)-100.9 F (38.3 C)] 100.9 F (38.3 C) (10/18 0537) Pulse Rate:  [91-104] 101 (10/18 0824) Resp:  [16-24] 18 (10/18 0824) BP: (109-129)/(60-72) 109/60 mmHg (10/18 0824) SpO2:  [98 %-100 %] 99 % (10/18 0824) FiO2 (%):  [33 %-36 %] 36 % (10/18 0401)  Intake/Output from previous day: 10/17 0701 - 10/18 0700 In: 1620.3 [I.V.:468.3; IV Piggyback:165; TPN:987] Out: 1925 [Urine:1450; Emesis/NG output:475] Intake/Output this shift:    Physical Exam: Work of breathing is effected by O2 in place.  Not labored.   NG in place.    Lab Results:  Results for orders placed during the hospital encounter of 08/08/13 (from the past 48 hour(s))  GLUCOSE, CAPILLARY     Status: Abnormal   Collection Time    08/19/13  4:21 PM      Result Value Range   Glucose-Capillary 126 (*) 70 - 99 mg/dL  GLUCOSE, CAPILLARY     Status: Abnormal   Collection Time    08/19/13 11:48 PM      Result Value Range   Glucose-Capillary 121 (*) 70 - 99 mg/dL  CBC     Status: Abnormal   Collection Time    08/20/13  5:00 AM      Result Value Range   WBC 16.3 (*) 4.0 - 10.5 K/uL   RBC 2.92 (*) 3.87 - 5.11 MIL/uL   Hemoglobin 8.9 (*) 12.0 - 15.0 g/dL   HCT 78.2 (*) 95.6 - 21.3 %   MCV 92.1  78.0 - 100.0 fL   MCH 30.5  26.0 - 34.0 pg   MCHC 33.1  30.0 - 36.0 g/dL   RDW 08.6  57.8 - 46.9 %   Platelets 311  150 - 400 K/uL  GLUCOSE, CAPILLARY     Status: Abnormal   Collection Time    08/20/13  7:52 AM      Result Value Range   Glucose-Capillary 151 (*) 70 - 99 mg/dL  GLUCOSE, CAPILLARY     Status: Abnormal   Collection Time    08/20/13  4:11 PM      Result Value Range   Glucose-Capillary 124 (*) 70 - 99 mg/dL   GLUCOSE, CAPILLARY     Status: Abnormal   Collection Time    08/21/13 12:21 AM      Result Value Range   Glucose-Capillary 130 (*) 70 - 99 mg/dL  CBC     Status: Abnormal   Collection Time    08/21/13  4:30 AM      Result Value Range   WBC 14.1 (*) 4.0 - 10.5 K/uL   RBC 2.65 (*) 3.87 - 5.11 MIL/uL   Hemoglobin 8.5 (*) 12.0 - 15.0 g/dL   HCT 62.9 (*) 52.8 - 41.3 %   MCV 95.1  78.0 - 100.0 fL   MCH 32.1  26.0 - 34.0 pg   MCHC 33.7  30.0 - 36.0 g/dL   RDW 24.4  01.0 - 27.2 %   Platelets 308  150 - 400 K/uL  BASIC METABOLIC PANEL     Status: Abnormal   Collection Time    08/21/13  4:30 AM      Result Value Range  Sodium    135 - 145 mEq/L   Value: QUESTIONABLE RESULTS, RECOMMEND RECOLLECT TO VERIFY   Potassium    3.5 - 5.1 mEq/L   Value: QUESTIONABLE RESULTS, RECOMMEND RECOLLECT TO VERIFY   Chloride    96 - 112 mEq/L   Value: QUESTIONABLE RESULTS, RECOMMEND RECOLLECT TO VERIFY   CO2 21  19 - 32 mEq/L   Glucose, Bld    70 - 99 mg/dL   Value: QUESTIONABLE RESULTS, RECOMMEND RECOLLECT TO VERIFY   Comment: P EUSTACHA AT 0637 ON 10.18.2014 BY NBROOKS   BUN 14  6 - 23 mg/dL   Creatinine, Ser 0.45  0.50 - 1.10 mg/dL   Calcium 8.9  8.4 - 40.9 mg/dL   GFR calc non Af Amer 78 (*) >90 mL/min   GFR calc Af Amer >90  >90 mL/min   Comment: (NOTE)     The eGFR has been calculated using the CKD EPI equation.     This calculation has not been validated in all clinical situations.     eGFR's persistently <90 mL/min signify possible Chronic Kidney     Disease.  MAGNESIUM     Status: None   Collection Time    08/21/13  4:30 AM      Result Value Range   Magnesium 2.3  1.5 - 2.5 mg/dL  PHOSPHORUS     Status: Abnormal   Collection Time    08/21/13  4:30 AM      Result Value Range   Phosphorus 7.2 (*) 2.3 - 4.6 mg/dL  BASIC METABOLIC PANEL     Status: Abnormal   Collection Time    08/21/13  6:46 AM      Result Value Range   Sodium 135  135 - 145 mEq/L   Comment: RESULTS VERIFIED VIA  RECOLLECT   Potassium 3.8  3.5 - 5.1 mEq/L   Chloride 101  96 - 112 mEq/L   CO2 24  19 - 32 mEq/L   Glucose, Bld 153 (*) 70 - 99 mg/dL   BUN 15  6 - 23 mg/dL   Creatinine, Ser 8.11  0.50 - 1.10 mg/dL   Calcium 9.4  8.4 - 91.4 mg/dL   GFR calc non Af Amer >90  >90 mL/min   GFR calc Af Amer >90  >90 mL/min   Comment: (NOTE)     The eGFR has been calculated using the CKD EPI equation.     This calculation has not been validated in all clinical situations.     eGFR's persistently <90 mL/min signify possible Chronic Kidney     Disease.  MAGNESIUM     Status: None   Collection Time    08/21/13  6:46 AM      Result Value Range   Magnesium 1.9  1.5 - 2.5 mg/dL  PHOSPHORUS     Status: None   Collection Time    08/21/13  6:46 AM      Result Value Range   Phosphorus 4.5  2.3 - 4.6 mg/dL  GLUCOSE, CAPILLARY     Status: Abnormal   Collection Time    08/21/13  8:09 AM      Result Value Range   Glucose-Capillary 147 (*) 70 - 99 mg/dL    Radiology/Results: Dg Chest Port 1 View  08/19/2013   CLINICAL DATA:  Right-sided chest pain and shortness of breath  EXAM: PORTABLE CHEST - 1 VIEW  COMPARISON:  None.  FINDINGS: The heart size and mediastinal contours are within normal  limits. Stable peribronchial thickening. Both lungs are clear. The visualized skeletal structures are unremarkable. Dorsal column stimulator mid thoracic position. Prior cervical fusion. PICC line tip cavoatrial junction.  IMPRESSION: No active disease.  No change from priors.  No pneumonia.   Electronically Signed   By: Davonna Belling M.D.   On: 08/19/2013 23:40    Anti-infectives: Anti-infectives   Start     Dose/Rate Route Frequency Ordered Stop   08/17/13 0600  clindamycin (CLEOCIN) IVPB 600 mg  Status:  Discontinued    Comments:  Pharmacy may adjust dosing strength, interval, or rate of medication as needed for optimal therapy for the patient Send with patient on call to the OR.  Anesthesia to complete antibiotic  administration <39min prior to incision per [facility].   600 mg 100 mL/hr over 30 Minutes Intravenous On call to O.R. 08/16/13 1720 08/17/13 0740   08/17/13 0600  gentamicin (GARAMYCIN) IVPB 100 mg  Status:  Discontinued    Comments:  Pharmacy may adjust dosing strength, schedule, rate of infusion, etc as needed to optimize therapy Send with patient on call to the OR.  Anesthesia to complete antibiotic administration <39min prior to incision per [facility].   100 mg 200 mL/hr over 30 Minutes Intravenous On call to O.R. 08/16/13 1720 08/16/13 1732   08/16/13 1732  gentamicin (GARAMYCIN) 370 mg in dextrose 5 % 100 mL IVPB     5 mg/kg  74.8 kg 218.5 mL/hr over 30 Minutes Intravenous 30 min pre-op 08/16/13 1731 08/16/13 1836      Assessment/Plan: Problem List: Patient Active Problem List   Diagnosis Date Noted  . Diabetes 08/21/2013  . Palliative care encounter 08/14/2013  . Abdominal pain, unspecified site 08/14/2013  . SBO (small bowel obstruction) 08/09/2013  . H. pylori duodenitis 07/07/2013  . Colicky RLQ abdominal pain, probable chronic PSBO 07/07/2013  . PTSD (post-traumatic stress disorder) 03/26/2013  . Chronic constipation 03/23/2013  . CNS disorder 03/22/2013  . Chronic migraine without aura 03/18/2013  . Insomnia 03/18/2013  . Abnormal weight loss 02/28/2013  . Nausea alone 02/09/2013  . Seasonal allergies 12/10/2012  . GERD (gastroesophageal reflux disease) 10/29/2012  . Chronic pain syndrome 10/06/2012  . Postlaminectomy syndrome 10/06/2012  . Onychomycosis 09/02/2012  . Cervical neck pain with evidence of disc disease 04/13/2012  . Neck muscle spasm 12/31/2011  . Anxiety state, unspecified 08/30/2010  . ALLERGIC RHINITIS CAUSE UNSPECIFIED 04/17/2010  . Intrinsic asthma, unspecified 03/04/2010  . DEPRESSION, CHRONIC 01/08/2009  . IGT (impaired glucose tolerance) 01/05/2009  . ESSENTIAL HYPERTENSION 11/27/2007    Patient is frustrated.  Advised to get in  to rocking chair today and try to ambulate more.   5 Days Post-Op    LOS: 13 days   Matt B. Daphine Deutscher, MD, [facility] [phone] beeper [phone]  08/21/2013 9:46 AM

## 2013-08-22 LAB — COMPREHENSIVE METABOLIC PANEL
ALT: 29 U/L (ref 0–35)
AST: 26 U/L (ref 0–37)
Albumin: 3 g/dL — ABNORMAL LOW (ref 3.5–5.2)
Calcium: 9.9 mg/dL (ref 8.4–10.5)
Chloride: 100 mEq/L (ref 96–112)
Creatinine, Ser: 0.94 mg/dL (ref 0.50–1.10)
Sodium: 136 mEq/L (ref 135–145)
Total Bilirubin: 0.4 mg/dL (ref 0.3–1.2)

## 2013-08-22 LAB — URINALYSIS, ROUTINE W REFLEX MICROSCOPIC
Bilirubin Urine: NEGATIVE
Glucose, UA: NEGATIVE mg/dL
Hgb urine dipstick: NEGATIVE
Leukocytes, UA: NEGATIVE
Nitrite: NEGATIVE
Protein, ur: NEGATIVE mg/dL
Specific Gravity, Urine: 1.031 — ABNORMAL HIGH (ref 1.005–1.030)
pH: 5.5 (ref 5.0–8.0)

## 2013-08-22 LAB — CBC
MCH: 30.5 pg (ref 26.0–34.0)
MCHC: 32.9 g/dL (ref 30.0–36.0)
MCV: 93 fL (ref 78.0–100.0)
Platelets: 387 10*3/uL (ref 150–400)
RBC: 2.98 MIL/uL — ABNORMAL LOW (ref 3.87–5.11)
RDW: 13.5 % (ref 11.5–15.5)
WBC: 17.7 10*3/uL — ABNORMAL HIGH (ref 4.0–10.5)

## 2013-08-22 LAB — GLUCOSE, CAPILLARY
Glucose-Capillary: 136 mg/dL — ABNORMAL HIGH (ref 70–99)
Glucose-Capillary: 115 mg/dL — ABNORMAL HIGH (ref 70–99)
Glucose-Capillary: 133 mg/dL — ABNORMAL HIGH (ref 70–99)

## 2013-08-22 MED ORDER — TRACE MINERALS CR-CU-F-FE-I-MN-MO-SE-ZN IV SOLN
INTRAVENOUS | Status: AC
  Administered 2013-08-22: 17:00:00 via INTRAVENOUS
  Filled 2013-08-22: qty 2000

## 2013-08-22 MED ORDER — FAT EMULSION 20 % IV EMUL
250.0000 mL | INTRAVENOUS | Status: AC
  Administered 2013-08-22: 250 mL via INTRAVENOUS
  Filled 2013-08-22: qty 250

## 2013-08-22 NOTE — Progress Notes (Signed)
PARENTERAL NUTRITION CONSULT NOTE - FOLLOW UP  Pharmacy Consult for TNA Indication: Bowel obstruction  Allergies  Allergen Reactions  . Neurontin [Gabapentin] Other (See Comments)    Other reaction(s): Mental Status Changes (intolerance) hallucinations Sleep walking  . Amoxicillin Hives  . Penicillins Hives  . Latex Rash   Patient Measurements: Height: 5\' 6"  (167.6 cm) Weight: 164 lb 14.5 oz (74.8 kg) IBW/kg (Calculated) : 59.3 Adjusted Body Weight: 64kg Usual Weight: 75kg  Vital Signs: Temp: 98.3 F (36.8 C) (10/19 0622) Temp src: Oral (10/19 0622) BP: 111/66 mmHg (10/19 0622) Pulse Rate: 102 (10/19 0622) Intake/Output from previous day: 10/18 0701 - 10/19 0700 In: 1701.3 [I.V.:471.7; IV Piggyback:275; TPN:954.7] Out: 2550 [Urine:2150; Emesis/NG output:400] Intake/Output from this shift:    Labs:  Recent Labs  08/20/13 0500 08/21/13 0430 08/22/13 0435  WBC 16.3* 14.1* 17.7*  HGB 8.9* 8.5* 9.1*  HCT 26.9* 25.2* 27.7*  PLT 311 308 387    Recent Labs  08/21/13 0430 08/21/13 0646 08/22/13 0435  NA QUESTIONABLE RESULTS, RECOMMEND RECOLLECT TO VERIFY 135 136  K QUESTIONABLE RESULTS, RECOMMEND RECOLLECT TO VERIFY 3.8 4.2  CL QUESTIONABLE RESULTS, RECOMMEND RECOLLECT TO VERIFY 101 100  CO2 21 24 24   GLUCOSE QUESTIONABLE RESULTS, RECOMMEND RECOLLECT TO VERIFY 153* 127*  BUN 14 15 15   CREATININE 0.89 0.79 0.94  CALCIUM 8.9 9.4 9.9  MG 2.3 1.9  --   PHOS 7.2* 4.5  --   PROT  --   --  7.7  ALBUMIN  --   --  3.0*  AST  --   --  26  ALT  --   --  29  ALKPHOS  --   --  200*  BILITOT  --   --  0.4  Corrected calcium: 9.42 (10/10)  Estimated Creatinine Clearance: 79 ml/min (by C-G formula based on Cr of 0.94).   Recent Labs  08/21/13 0809 08/21/13 1614 08/22/13 0011  GLUCAP 147* 129* 137*   Medications:  Scheduled:  . albuterol  2.5 mg Nebulization BID  . budesonide-formoterol  2 puff Inhalation BID  . fentaNYL  50 mcg Transdermal Q72H  .  fluticasone  2 spray Each Nare Daily  . heparin  5,000 Units Subcutaneous Q8H  . HYDROmorphone PCA 0.3 mg/mL   Intravenous Q4H  . insulin aspart  0-15 Units Subcutaneous Q8H  . ipratropium  500 mcg Nebulization BID  . lip balm  1 application Topical BID  . magic mouthwash  15 mL Oral QID  . methocarbamol (ROBAXIN) IV  500 mg Intravenous Q8H  . metoprolol  5 mg Intravenous Q6H  . olopatadine  1 drop Both Eyes BID   Infusions:  . dextrose 5 % and 0.45 % NaCl with KCl 20 mEq/L 20 mL/hr (08/21/13 1726)  . Marland KitchenTPN (CLINIMIX-E) Adult 80 mL/hr at 08/21/13 1733   And  . fat emulsion 250 mL (08/21/13 1734)   Insulin Requirements in the past 24 hours:  6 units - SSI moderate scale q8h   IVF: D5 1/2NS w/ 20KCl at 10-20 ml/hr  Current Nutrition:  NPO except ice chips post-op, NG tube remains, removed previously 10/11  Clinimix E 5/15 at 75 ml/hr Lipids 20% at 10 ml/hr  Nutritional Goals:  Re-estimated per RD on 10/16: Kcal: 1750-1950  Protein: 90-105g  Fluid: 1.7-1.9L/day   ClinimixE 5/15 at 80 mL/hr + Lipids 20% 1mL/hr = 96g protein, 1843 kcal per day  Assessment: 42 y/o F s/p enterolysis of adhesions 07/29/13, presented to ED 08/08/13 with  small bowel obstruction. TNA initiated 10/9 -Expl lap to open lap 10/13: small bowel resection due to inflammatory mass of mesentery, LOA  -10/15 Ex lap/SB resection/lysis of adhesions - Continue NG tube until post-operative ileus resolves  Labs:  Electrolytes: wnl on 10/18 and 10/19  LFTs: wnl (10/16), WNL (10/19)  TGs: normal, 94 (10/10), 112 (10/13)  Prealbumin: low, 13.3 (10/10), 18.7 (10/13)  CBG's - WNL with SSI coverage   TPN Access: Double lumen PICC TPN day#: 12  Plan: At 1800 today:  Continue Clinimix E 5/15 @ goal of 80 ml/hr  Continue 20% fat emulsion at 52ml/hr daily  TNA to contain standard multivitamins and trace elements daily  TNA lab panels on Mondays & Thursdays  F/u daily for TNA tolerance, diet  advancement  Continue Moderate SSI Q8h  Continue MIVF at [facility], PharmD, BCPS Pager [phone] 08/22/2013 8:11 AM

## 2013-08-22 NOTE — Progress Notes (Signed)
Patient ID: Jane English, female   DOB: 26-Jun-1971, 42 y.o.   MRN: [id number] [facility] Progress Note:   6 Days Post-Op  Subjective: Mental status is clear but patient is evasive to questions.  Still getting bloated when NG clamped.  No flatus.   Objective: Vital signs in last 24 hours: Temp:  [98.3 F (36.8 C)-100.8 F (38.2 C)] 98.3 F (36.8 C) (10/19 0622) Pulse Rate:  [102-112] 102 (10/19 0622) Resp:  [16-28] 20 (10/19 0911) BP: (110-128)/(55-77) 111/66 mmHg (10/19 0622) SpO2:  [97 %-100 %] 100 % (10/19 0911)  Intake/Output from previous day: 10/18 0701 - 10/19 0700 In: 1701.3 [I.V.:471.7; IV Piggyback:275; TPN:954.7] Out: 2550 [Urine:2150; Emesis/NG output:400] Intake/Output this shift:    Physical Exam: Work of breathing is normal.  NG drainage is green-brown.  Incision has some pinkness but not infected.    Lab Results:  Results for orders placed during the hospital encounter of 08/08/13 (from the past 48 hour(s))  GLUCOSE, CAPILLARY     Status: Abnormal   Collection Time    08/20/13  4:11 PM      Result Value Range   Glucose-Capillary 124 (*) 70 - 99 mg/dL  GLUCOSE, CAPILLARY     Status: Abnormal   Collection Time    08/21/13 12:21 AM      Result Value Range   Glucose-Capillary 130 (*) 70 - 99 mg/dL  CBC     Status: Abnormal   Collection Time    08/21/13  4:30 AM      Result Value Range   WBC 14.1 (*) 4.0 - 10.5 K/uL   RBC 2.65 (*) 3.87 - 5.11 MIL/uL   Hemoglobin 8.5 (*) 12.0 - 15.0 g/dL   HCT 40.9 (*) 81.1 - 91.4 %   MCV 95.1  78.0 - 100.0 fL   MCH 32.1  26.0 - 34.0 pg   MCHC 33.7  30.0 - 36.0 g/dL   RDW 78.2  95.6 - 21.3 %   Platelets 308  150 - 400 K/uL  BASIC METABOLIC PANEL     Status: Abnormal   Collection Time    08/21/13  4:30 AM      Result Value Range   Sodium    135 - 145 mEq/L   Value: QUESTIONABLE RESULTS, RECOMMEND RECOLLECT TO VERIFY   Potassium    3.5 - 5.1 mEq/L   Value: QUESTIONABLE RESULTS, RECOMMEND RECOLLECT TO VERIFY    Chloride    96 - 112 mEq/L   Value: QUESTIONABLE RESULTS, RECOMMEND RECOLLECT TO VERIFY   CO2 21  19 - 32 mEq/L   Glucose, Bld    70 - 99 mg/dL   Value: QUESTIONABLE RESULTS, RECOMMEND RECOLLECT TO VERIFY   Comment: P EUSTACHA AT 0637 ON 10.18.2014 BY NBROOKS   BUN 14  6 - 23 mg/dL   Creatinine, Ser 0.86  0.50 - 1.10 mg/dL   Calcium 8.9  8.4 - 57.8 mg/dL   GFR calc non Af Amer 78 (*) >90 mL/min   GFR calc Af Amer >90  >90 mL/min   Comment: (NOTE)     The eGFR has been calculated using the CKD EPI equation.     This calculation has not been validated in all clinical situations.     eGFR's persistently <90 mL/min signify possible Chronic Kidney     Disease.  MAGNESIUM     Status: None   Collection Time    08/21/13  4:30 AM      Result Value  Range   Magnesium 2.3  1.5 - 2.5 mg/dL  PHOSPHORUS     Status: Abnormal   Collection Time    08/21/13  4:30 AM      Result Value Range   Phosphorus 7.2 (*) 2.3 - 4.6 mg/dL  BASIC METABOLIC PANEL     Status: Abnormal   Collection Time    08/21/13  6:46 AM      Result Value Range   Sodium 135  135 - 145 mEq/L   Comment: RESULTS VERIFIED VIA RECOLLECT   Potassium 3.8  3.5 - 5.1 mEq/L   Chloride 101  96 - 112 mEq/L   CO2 24  19 - 32 mEq/L   Glucose, Bld 153 (*) 70 - 99 mg/dL   BUN 15  6 - 23 mg/dL   Creatinine, Ser 1.61  0.50 - 1.10 mg/dL   Calcium 9.4  8.4 - 09.6 mg/dL   GFR calc non Af Amer >90  >90 mL/min   GFR calc Af Amer >90  >90 mL/min   Comment: (NOTE)     The eGFR has been calculated using the CKD EPI equation.     This calculation has not been validated in all clinical situations.     eGFR's persistently <90 mL/min signify possible Chronic Kidney     Disease.  MAGNESIUM     Status: None   Collection Time    08/21/13  6:46 AM      Result Value Range   Magnesium 1.9  1.5 - 2.5 mg/dL  PHOSPHORUS     Status: None   Collection Time    08/21/13  6:46 AM      Result Value Range   Phosphorus 4.5  2.3 - 4.6 mg/dL  GLUCOSE,  CAPILLARY     Status: Abnormal   Collection Time    08/21/13  8:09 AM      Result Value Range   Glucose-Capillary 147 (*) 70 - 99 mg/dL  GLUCOSE, CAPILLARY     Status: Abnormal   Collection Time    08/21/13  4:14 PM      Result Value Range   Glucose-Capillary 129 (*) 70 - 99 mg/dL  GLUCOSE, CAPILLARY     Status: Abnormal   Collection Time    08/22/13 12:11 AM      Result Value Range   Glucose-Capillary 137 (*) 70 - 99 mg/dL  CBC     Status: Abnormal   Collection Time    08/22/13  4:35 AM      Result Value Range   WBC 17.7 (*) 4.0 - 10.5 K/uL   RBC 2.98 (*) 3.87 - 5.11 MIL/uL   Hemoglobin 9.1 (*) 12.0 - 15.0 g/dL   HCT 04.5 (*) 40.9 - 81.1 %   MCV 93.0  78.0 - 100.0 fL   MCH 30.5  26.0 - 34.0 pg   MCHC 32.9  30.0 - 36.0 g/dL   RDW 91.4  78.2 - 95.6 %   Platelets 387  150 - 400 K/uL  COMPREHENSIVE METABOLIC PANEL     Status: Abnormal   Collection Time    08/22/13  4:35 AM      Result Value Range   Sodium 136  135 - 145 mEq/L   Potassium 4.2  3.5 - 5.1 mEq/L   Chloride 100  96 - 112 mEq/L   CO2 24  19 - 32 mEq/L   Glucose, Bld 127 (*) 70 - 99 mg/dL   BUN 15  6 - 23  mg/dL   Creatinine, Ser 1.61  0.50 - 1.10 mg/dL   Calcium 9.9  8.4 - 09.6 mg/dL   Total Protein 7.7  6.0 - 8.3 g/dL   Albumin 3.0 (*) 3.5 - 5.2 g/dL   AST 26  0 - 37 U/L   ALT 29  0 - 35 U/L   Alkaline Phosphatase 200 (*) 39 - 117 U/L   Total Bilirubin 0.4  0.3 - 1.2 mg/dL   GFR calc non Af Amer 73 (*) >90 mL/min   GFR calc Af Amer 84 (*) >90 mL/min   Comment: (NOTE)     The eGFR has been calculated using the CKD EPI equation.     This calculation has not been validated in all clinical situations.     eGFR's persistently <90 mL/min signify possible Chronic Kidney     Disease.  GLUCOSE, CAPILLARY     Status: Abnormal   Collection Time    08/22/13  9:02 AM      Result Value Range   Glucose-Capillary 136 (*) 70 - 99 mg/dL    Radiology/Results: No results found.  Anti-infectives: Anti-infectives    Start     Dose/Rate Route Frequency Ordered Stop   08/17/13 0600  clindamycin (CLEOCIN) IVPB 600 mg  Status:  Discontinued    Comments:  Pharmacy may adjust dosing strength, interval, or rate of medication as needed for optimal therapy for the patient Send with patient on call to the OR.  Anesthesia to complete antibiotic administration <83min prior to incision per [facility].   600 mg 100 mL/hr over 30 Minutes Intravenous On call to O.R. 08/16/13 1720 08/17/13 0740   08/17/13 0600  gentamicin (GARAMYCIN) IVPB 100 mg  Status:  Discontinued    Comments:  Pharmacy may adjust dosing strength, schedule, rate of infusion, etc as needed to optimize therapy Send with patient on call to the OR.  Anesthesia to complete antibiotic administration <68min prior to incision per [facility].   100 mg 200 mL/hr over 30 Minutes Intravenous On call to O.R. 08/16/13 1720 08/16/13 1732   08/16/13 1732  gentamicin (GARAMYCIN) 370 mg in dextrose 5 % 100 mL IVPB     5 mg/kg  74.8 kg 218.5 mL/hr over 30 Minutes Intravenous 30 min pre-op 08/16/13 1731 08/16/13 1836      Assessment/Plan: Problem List: Patient Active Problem List   Diagnosis Date Noted  . Diabetes 08/21/2013  . Palliative care encounter 08/14/2013  . Abdominal pain, unspecified site 08/14/2013  . SBO (small bowel obstruction) 08/09/2013  . H. pylori duodenitis 07/07/2013  . Colicky RLQ abdominal pain, probable chronic PSBO 07/07/2013  . PTSD (post-traumatic stress disorder) 03/26/2013  . Chronic constipation 03/23/2013  . CNS disorder 03/22/2013  . Chronic migraine without aura 03/18/2013  . Insomnia 03/18/2013  . Abnormal weight loss 02/28/2013  . Nausea alone 02/09/2013  . Seasonal allergies 12/10/2012  . GERD (gastroesophageal reflux disease) 10/29/2012  . Chronic pain syndrome 10/06/2012  . Postlaminectomy syndrome 10/06/2012  . Onychomycosis 09/02/2012  . Cervical neck pain with evidence of disc disease 04/13/2012  . Neck  muscle spasm 12/31/2011  . Anxiety state, unspecified 08/30/2010  . ALLERGIC RHINITIS CAUSE UNSPECIFIED 04/17/2010  . Intrinsic asthma, unspecified 03/04/2010  . DEPRESSION, CHRONIC 01/08/2009  . IGT (impaired glucose tolerance) 01/05/2009  . ESSENTIAL HYPERTENSION 11/27/2007    Prolonged ileus that is being appropriately supported with TNA 6 Days Post-Op    LOS: 14 days   Matt B. Daphine Deutscher, MD, FACS  [facility] [phone] beeper [phone]  08/22/2013 9:47 AM

## 2013-08-23 ENCOUNTER — Inpatient Hospital Stay (HOSPITAL_COMMUNITY): Payer: Managed Care, Other (non HMO)

## 2013-08-23 DIAGNOSIS — R109 Unspecified abdominal pain: Secondary | ICD-10-CM

## 2013-08-23 DIAGNOSIS — Z515 Encounter for palliative care: Secondary | ICD-10-CM | POA: Diagnosis not present

## 2013-08-23 LAB — CBC
Hemoglobin: 8.3 g/dL — ABNORMAL LOW (ref 12.0–15.0)
MCV: 92.5 fL (ref 78.0–100.0)
Platelets: 340 10*3/uL (ref 150–400)
RBC: 2.67 MIL/uL — ABNORMAL LOW (ref 3.87–5.11)
RDW: 13.4 % (ref 11.5–15.5)
WBC: 16.2 10*3/uL — ABNORMAL HIGH (ref 4.0–10.5)

## 2013-08-23 LAB — COMPREHENSIVE METABOLIC PANEL
ALT: 33 U/L (ref 0–35)
Albumin: 2.6 g/dL — ABNORMAL LOW (ref 3.5–5.2)
BUN: 14 mg/dL (ref 6–23)
CO2: 26 mEq/L (ref 19–32)
Calcium: 9 mg/dL (ref 8.4–10.5)
Creatinine, Ser: 0.75 mg/dL (ref 0.50–1.10)
GFR calc Af Amer: >90 mL/min (ref >90)
GFR calc non Af Amer: >90 mL/min (ref >90)
Glucose, Bld: 151 mg/dL — ABNORMAL HIGH (ref 70–99)
Potassium: 4 mEq/L (ref 3.5–5.1)
Sodium: 136 mEq/L (ref 135–145)

## 2013-08-23 LAB — URINE CULTURE
Colony Count: NO GROWTH
Culture: NO GROWTH
Special Requests: NORMAL

## 2013-08-23 LAB — DIFFERENTIAL
Eosinophils Relative: 4 % (ref 0–5)
Monocytes Relative: 9 % (ref 3–12)
Neutrophils Relative %: 74 % (ref 43–77)

## 2013-08-23 LAB — GLUCOSE, CAPILLARY: Glucose-Capillary: 126 mg/dL — ABNORMAL HIGH (ref 70–99)

## 2013-08-23 LAB — TRIGLYCERIDES: Triglycerides: 90 mg/dL (ref <150)

## 2013-08-23 LAB — MAGNESIUM: Magnesium: 2.2 mg/dL (ref 1.5–2.5)

## 2013-08-23 MED ORDER — HYDROCORTISONE ACE-PRAMOXINE 2.5-1 % RE CREA
1.0000 "application " | TOPICAL_CREAM | Freq: Four times a day (QID) | RECTAL | Status: DC | PRN
  Filled 2013-08-23: qty 30

## 2013-08-23 MED ORDER — DEXTROSE 5 % IV SOLN
500.0000 mg | Freq: Three times a day (TID) | INTRAVENOUS | Status: DC | PRN
  Administered 2013-08-31: 500 mg via INTRAVENOUS
  Filled 2013-08-23 (×2): qty 5

## 2013-08-23 MED ORDER — TRACE MINERALS CR-CU-F-FE-I-MN-MO-SE-ZN IV SOLN
INTRAVENOUS | Status: AC
  Administered 2013-08-23: 18:00:00 via INTRAVENOUS
  Filled 2013-08-23: qty 2000

## 2013-08-23 MED ORDER — FAT EMULSION 20 % IV EMUL
250.0000 mL | INTRAVENOUS | Status: AC
  Administered 2013-08-23: 250 mL via INTRAVENOUS
  Filled 2013-08-23: qty 250

## 2013-08-23 MED ORDER — LACTATED RINGERS IV BOLUS (SEPSIS)
1000.0000 mL | Freq: Once | INTRAVENOUS | Status: AC
  Administered 2013-08-23: 1000 mL via INTRAVENOUS

## 2013-08-23 MED ORDER — BISACODYL 10 MG RE SUPP: 10.0000 mg | Freq: Two times a day (BID) | RECTAL | Status: DC | PRN

## 2013-08-23 NOTE — Progress Notes (Addendum)
Jane English [id number] May 30, 1971  CARE TEAM:  PCP: Syliva Overman, MD  Outpatient Care Team: Patient Care Team: Kerri Perches, MD as PCP - General Ardeth Sportsman, MD as Consulting Physician (General Surgery) Karn Cassis, MD as Consulting Physician (Neurosurgery) Beverley Fiedler, MD as Consulting Physician (Gastroenterology)  Inpatient Treatment Team: Treatment Team: Attending Provider: Ardeth Sportsman, MD; Registered Nurse: Tristan Schroeder, RN; Consulting Physician: Palliative Doristine Johns; Dietitian: Lavena Bullion, RD; Registered Nurse: Lattie Haw, RN; Technician: Oren Bracket [place], NT; Technician: Valetta Close, [place]; Technician: Melvenia Beam, NT; Technician: Vella Raring, NT   Subjective:  Walking a lot in hallways C/o chest gas C/o burning urination C/o "my booty hurts" Using PCA a lot   Objective:  Vital signs:  Filed Vitals:   08/22/13 2343 08/23/13 0108 08/23/13 0347 08/23/13 0540  BP:  113/71  142/78  Pulse:  102  100  Temp:    98.9 F (37.2 C)  TempSrc:    Oral  Resp: 24  18 21   Height:      Weight:      SpO2: 100%  100% 100%    Last BM Date: 08/16/13  Intake/Output   Yesterday:  10/19 0701 - 10/20 0700 In: 1674.2 [I.V.:480; IV Piggyback:165; TPN:1029.2] Out: 2275 [Urine:1000; Emesis/NG output:925; Stool:350] This shift:     Bowel function:  Flatus: NO  BM: Pt denies.  Drain: Thin bilious  Physical Exam:  General: Pt awake/alert/oriented x4 in mild distress.  Alert Eyes: PERRL, normal EOM.  Sclera clear.  No icterus Neuro: CN II-XII intact w/o focal sensory/motor deficits. Lymph: No head/neck/groin lymphadenopathy Psych:  No delerium/psychosis/paranoia.  Anxious but consolable.  Tearful HENT: Normocephalic, Mucus membranes moist.  No thrush.  NGT in place Neck: Supple, No tracheal deviation Chest: No chest wall pain w good excursion CV:  Pulses intact.  Regular rhythm MS: Normal AROM mjr  joints.  No obvious deformity Abdomen: Soft.  Obese.  Nondistended.  Sensitive at incision/OnQ - less guarding today.  Incisions clean.  No incarcerated hernias.   Ext:  SCDs BLE.  No mjr edema.  No cyanosis Skin: No petechiae / purpura   Problem List:   Principal Problem:   SBO (small bowel obstruction) Active Problems:   Chronic pain syndrome   Anxiety state, unspecified   DEPRESSION, CHRONIC   Chronic constipation   PTSD (post-traumatic stress disorder)   Chronic migraine without aura   Palliative care encounter   Abdominal pain, unspecified site   Diabetes  SURGERY 07/29/2013 POST-OPERATIVE DIAGNOSIS: recurrent abdominal pain,extensive adhesions   PROCEDURE: Procedure(s):  diagnostic laporoscopy  LYSIS OF ADHESIONS  serosal repair  SURGEON: Surgeon(s):  Ardeth Sportsman, MD  Kandis Cocking, MD - Asst      Assessment  Jane English  42 y.o. female  7 Days Post-Op    Partial small bowel obstruction in patient with chronic pain on chronic narcotics s/p significant adhesiolysis & repeat exlap with SB resection  Plan:  -Continue IV fluid hydration.  TNA  -Continue nasogastric tube until postoperative ileus resolved.  Lower nasogastric tube output with soft abdomen a hopeful sign.  Check CXR/AXR to evaluate  -Dilaudid PCA.  ice/heat RTC.  Robaxin PRN.  Refused fentanyl patch  -PRN nausea control.   -palliate sore throat  -sitz baths.  Analpram  -UA negative - follow  -VTE prophylaxis- SCDs, etc  -mobilize as tolerated to help recovery.  The more she can walk, the better.  -?control  depression.  Difficult with ileus/SBO.  D/w Palliative Care team in past.  She needs to get back to her chronic pain specialist at [facility] at some point, but this is not a great time to radically change her regimen if possible.     -I updated the patient's status to the patient & her husband.  Recommendations were made.  Questions were answered.  They expressed understanding  & appreciation.    Ardeth Sportsman, M.D., F.A.C.S. Gastrointestinal and Minimally Invasive Surgery [facility] [address], [address] [place], [place] [address] [phone] Main / Paging   08/23/2013   Results:   Labs: Results for orders placed during the hospital encounter of 08/08/13 (from the past 48 hour(s))  GLUCOSE, CAPILLARY     Status: Abnormal   Collection Time    08/21/13  8:09 AM      Result Value Range   Glucose-Capillary 147 (*) 70 - 99 mg/dL  GLUCOSE, CAPILLARY     Status: Abnormal   Collection Time    08/21/13  4:14 PM      Result Value Range   Glucose-Capillary 129 (*) 70 - 99 mg/dL  GLUCOSE, CAPILLARY     Status: Abnormal   Collection Time    08/22/13 12:11 AM      Result Value Range   Glucose-Capillary 137 (*) 70 - 99 mg/dL  CBC     Status: Abnormal   Collection Time    08/22/13  4:35 AM      Result Value Range   WBC 17.7 (*) 4.0 - 10.5 K/uL   RBC 2.98 (*) 3.87 - 5.11 MIL/uL   Hemoglobin 9.1 (*) 12.0 - 15.0 g/dL   HCT 65.7 (*) 84.6 - 96.2 %   MCV 93.0  78.0 - 100.0 fL   MCH 30.5  26.0 - 34.0 pg   MCHC 32.9  30.0 - 36.0 g/dL   RDW 95.2  84.1 - 32.4 %   Platelets 387  150 - 400 K/uL  COMPREHENSIVE METABOLIC PANEL     Status: Abnormal   Collection Time    08/22/13  4:35 AM      Result Value Range   Sodium 136  135 - 145 mEq/L   Potassium 4.2  3.5 - 5.1 mEq/L   Chloride 100  96 - 112 mEq/L   CO2 24  19 - 32 mEq/L   Glucose, Bld 127 (*) 70 - 99 mg/dL   BUN 15  6 - 23 mg/dL   Creatinine, Ser 4.01  0.50 - 1.10 mg/dL   Calcium 9.9  8.4 - 02.7 mg/dL   Total Protein 7.7  6.0 - 8.3 g/dL   Albumin 3.0 (*) 3.5 - 5.2 g/dL   AST 26  0 - 37 U/L   ALT 29  0 - 35 U/L   Alkaline Phosphatase 200 (*) 39 - 117 U/L   Total Bilirubin 0.4  0.3 - 1.2 mg/dL   GFR calc non Af Amer 73 (*) >90 mL/min   GFR calc Af Amer 84 (*) >90 mL/min   Comment: (NOTE)     The eGFR has been calculated using the CKD EPI equation.     This  calculation has not been validated in all clinical situations.     eGFR's persistently <90 mL/min signify possible Chronic Kidney     Disease.  GLUCOSE, CAPILLARY     Status: Abnormal   Collection Time    08/22/13  9:02 AM  Result Value Range   Glucose-Capillary 136 (*) 70 - 99 mg/dL  URINALYSIS, ROUTINE W REFLEX MICROSCOPIC     Status: Abnormal   Collection Time    08/22/13  5:03 PM      Result Value Range   Color, Urine AMBER (*) YELLOW   Comment: BIOCHEMICALS MAY BE AFFECTED BY COLOR   APPearance CLEAR  CLEAR   Specific Gravity, Urine 1.031 (*) 1.005 - 1.030   pH 5.5  5.0 - 8.0   Glucose, UA NEGATIVE  NEGATIVE mg/dL   Hgb urine dipstick NEGATIVE  NEGATIVE   Bilirubin Urine NEGATIVE  NEGATIVE   Ketones, ur NEGATIVE  NEGATIVE mg/dL   Protein, ur NEGATIVE  NEGATIVE mg/dL   Urobilinogen, UA 1.0  0.0 - 1.0 mg/dL   Nitrite NEGATIVE  NEGATIVE   Leukocytes, UA NEGATIVE  NEGATIVE   Comment: MICROSCOPIC NOT DONE ON URINES WITH NEGATIVE PROTEIN, BLOOD, LEUKOCYTES, NITRITE, OR GLUCOSE <1000 mg/dL.  GLUCOSE, CAPILLARY     Status: Abnormal   Collection Time    08/22/13  5:29 PM      Result Value Range   Glucose-Capillary 115 (*) 70 - 99 mg/dL  GLUCOSE, CAPILLARY     Status: Abnormal   Collection Time    08/22/13 11:23 PM      Result Value Range   Glucose-Capillary 133 (*) 70 - 99 mg/dL  COMPREHENSIVE METABOLIC PANEL     Status: Abnormal   Collection Time    08/23/13  4:15 AM      Result Value Range   Sodium 136  135 - 145 mEq/L   Potassium 4.0  3.5 - 5.1 mEq/L   Chloride 101  96 - 112 mEq/L   CO2 26  19 - 32 mEq/L   Glucose, Bld 151 (*) 70 - 99 mg/dL   BUN 14  6 - 23 mg/dL   Creatinine, Ser 1.61  0.50 - 1.10 mg/dL   Calcium 9.0  8.4 - 09.6 mg/dL   Total Protein 6.6  6.0 - 8.3 g/dL   Albumin 2.6 (*) 3.5 - 5.2 g/dL   AST 28  0 - 37 U/L   ALT 33  0 - 35 U/L   Alkaline Phosphatase 208 (*) 39 - 117 U/L   Total Bilirubin 0.3  0.3 - 1.2 mg/dL   GFR calc non Af Amer >90  >90  mL/min   GFR calc Af Amer >90  >90 mL/min   Comment: (NOTE)     The eGFR has been calculated using the CKD EPI equation.     This calculation has not been validated in all clinical situations.     eGFR's persistently <90 mL/min signify possible Chronic Kidney     Disease.  MAGNESIUM     Status: None   Collection Time    08/23/13  4:15 AM      Result Value Range   Magnesium 2.2  1.5 - 2.5 mg/dL  PHOSPHORUS     Status: Abnormal   Collection Time    08/23/13  4:15 AM      Result Value Range   Phosphorus 5.4 (*) 2.3 - 4.6 mg/dL  CBC     Status: Abnormal   Collection Time    08/23/13  4:15 AM      Result Value Range   WBC 16.2 (*) 4.0 - 10.5 K/uL   RBC 2.67 (*) 3.87 - 5.11 MIL/uL   Hemoglobin 8.3 (*) 12.0 - 15.0 g/dL   HCT 04.5 (*) 40.9 -  46.0 %   MCV 92.5  78.0 - 100.0 fL   MCH 31.1  26.0 - 34.0 pg   MCHC 33.6  30.0 - 36.0 g/dL   RDW 16.1  09.6 - 04.5 %   Platelets 340  150 - 400 K/uL  DIFFERENTIAL     Status: Abnormal   Collection Time    08/23/13  4:15 AM      Result Value Range   Neutrophils Relative % 74  43 - 77 %   Lymphocytes Relative 12  12 - 46 %   Monocytes Relative 9  3 - 12 %   Eosinophils Relative 4  0 - 5 %   Basophils Relative 1  0 - 1 %   Neutro Abs 12.0 (*) 1.7 - 7.7 K/uL   Lymphs Abs 1.9  0.7 - 4.0 K/uL   Monocytes Absolute 1.5 (*) 0.1 - 1.0 K/uL   Eosinophils Absolute 0.6  0.0 - 0.7 K/uL   Basophils Absolute 0.2 (*) 0.0 - 0.1 K/uL   WBC Morphology MILD LEFT SHIFT (1-5% METAS, OCC MYELO, OCC BANDS)      Imaging / Studies: No results found.  Medications / Allergies: per chart  Antibiotics: Anti-infectives   Start     Dose/Rate Route Frequency Ordered Stop   08/17/13 0600  clindamycin (CLEOCIN) IVPB 600 mg  Status:  Discontinued    Comments:  Pharmacy may adjust dosing strength, interval, or rate of medication as needed for optimal therapy for the patient Send with patient on call to the OR.  Anesthesia to complete antibiotic administration <69min  prior to incision per [facility].   600 mg 100 mL/hr over 30 Minutes Intravenous On call to O.R. 08/16/13 1720 08/17/13 0740   08/17/13 0600  gentamicin (GARAMYCIN) IVPB 100 mg  Status:  Discontinued    Comments:  Pharmacy may adjust dosing strength, schedule, rate of infusion, etc as needed to optimize therapy Send with patient on call to the OR.  Anesthesia to complete antibiotic administration <78min prior to incision per [facility].   100 mg 200 mL/hr over 30 Minutes Intravenous On call to O.R. 08/16/13 1720 08/16/13 1732   08/16/13 1732  gentamicin (GARAMYCIN) 370 mg in dextrose 5 % 100 mL IVPB     5 mg/kg  74.8 kg 218.5 mL/hr over 30 Minutes Intravenous 30 min pre-op 08/16/13 1731 08/16/13 1836

## 2013-08-23 NOTE — Progress Notes (Signed)
08/23/13 1400  Clinical Encounter Type  Visited With Patient not available;Health care provider (pt sleeping, with no family present; consulted with RN)  Visit Type Follow-up   Attempted follow-up visit, but pt sleeping.  Per RN, husband went back to work today, and daughter visited earlier.  Please page [phone] as further spiritual and emotional support would be meaningful to Jane English and family.  Thank you.  [address] [place], [place] [phone]

## 2013-08-23 NOTE — Progress Notes (Signed)
  This NP followed up at bedside with patient for continued conversation regarding pain management strategies and psycho-social support.  Patient readily shared her "difficult and prolonged" hospitalization and hopes to "go home soon".  She engaged freely, offering information regarding her four children and her difficulties in raising them as a single mother.  She voices some regrets.  We discussed pain management strategies and the role pain plays in her overall treatment plan. She verbalizes understanding of:  -need to decrease the need for opiods -role they play in concept of contipation and "slow bowel" openness to alternative staegies; prayer, mediatative breathing, relaxation -importance of increased movement, exercise, deep breathing  Patient expressed interest in learning relaxation breathing to music.  This NP will demonstrate on tomorrow's visit  Total time spent with the patient was 30 minutes  Time in  1530    Time out 1600  Greater than 50% time spent in counseling and coordination of care.  Lorinda Creed NP  Palliative Medicine Team Team Phone # [phone] Pager [phone]

## 2013-08-23 NOTE — Progress Notes (Signed)
PARENTERAL NUTRITION CONSULT NOTE - FOLLOW UP  Pharmacy Consult for TNA Indication: Bowel obstruction  Allergies  Allergen Reactions  . Neurontin [Gabapentin] Other (See Comments)    Other reaction(s): Mental Status Changes (intolerance) hallucinations Sleep walking  . Amoxicillin Hives  . Penicillins Hives  . Latex Rash   Patient Measurements: 08/08/13: Height: 5\' 6"  (167.6 cm) Weight: 164 lb 14.5 oz (74.8 kg) IBW/kg (Calculated) : 59.3 Adjusted Body Weight: 64kg Usual Weight: 75kg  Vital Signs: Temp: 100.3 F (37.9 C) (10/19 2100) Temp src: Oral (10/19 2100) BP: 113/71 mmHg (10/20 0108) Pulse Rate: 102 (10/20 0108) Intake/Output from previous day: 10/19 0701 - 10/20 0700 In: 1674.2 [I.V.:480; IV Piggyback:165; TPN:1029.2] Out: 1975 [Urine:700; Emesis/NG output:925; Stool:350] Intake/Output from this shift: Total I/O In: 479.3 [I.V.:314.3; IV Piggyback:165] Out: 1000 [Urine:300; Emesis/NG output:700]  Labs:  Recent Labs  08/21/13 0430 08/22/13 0435 08/23/13 0415  WBC 14.1* 17.7* 16.2*  HGB 8.5* 9.1* 8.3*  HCT 25.2* 27.7* 24.7*  PLT 308 387 340    Recent Labs  08/21/13 0430 08/21/13 0646 08/22/13 0435 08/23/13 0415  NA QUESTIONABLE RESULTS, RECOMMEND RECOLLECT TO VERIFY 135 136 136  K QUESTIONABLE RESULTS, RECOMMEND RECOLLECT TO VERIFY 3.8 4.2 4.0  CL QUESTIONABLE RESULTS, RECOMMEND RECOLLECT TO VERIFY 101 100 101  CO2 21 24 24 26   GLUCOSE QUESTIONABLE RESULTS, RECOMMEND RECOLLECT TO VERIFY 153* 127* 151*  BUN 14 15 15 14   CREATININE 0.89 0.79 0.94 0.75  CALCIUM 8.9 9.4 9.9 9.0  MG 2.3 1.9  --  2.2  PHOS 7.2* 4.5  --  5.4*  PROT  --   --  7.7 6.6  ALBUMIN  --   --  3.0* 2.6*  AST  --   --  26 28  ALT  --   --  29 33  ALKPHOS  --   --  200* 208*  BILITOT  --   --  0.4 0.3  Corrected calcium on 10/20: 10.1 Triglycerides on 10/20: 90   Estimated Creatinine Clearance: 92.8 ml/min (by C-G formula based on Cr of 0.75).   Recent Labs   08/22/13 0902 08/22/13 1729 08/22/13 2323  GLUCAP 136* 115* 133*   Medications:  Scheduled:  . albuterol  2.5 mg Nebulization BID  . budesonide-formoterol  2 puff Inhalation BID  . fentaNYL  50 mcg Transdermal Q72H  . fluticasone  2 spray Each Nare Daily  . heparin  5,000 Units Subcutaneous Q8H  . HYDROmorphone PCA 0.3 mg/mL   Intravenous Q4H  . insulin aspart  0-15 Units Subcutaneous Q8H  . ipratropium  500 mcg Nebulization BID  . lip balm  1 application Topical BID  . magic mouthwash  15 mL Oral QID  . methocarbamol (ROBAXIN) IV  500 mg Intravenous Q8H  . metoprolol  5 mg Intravenous Q6H  . olopatadine  1 drop Both Eyes BID   Infusions:  . dextrose 5 % and 0.45 % NaCl with KCl 20 mEq/L 20 mL/hr (08/21/13 1726)  . Marland KitchenTPN (CLINIMIX-E) Adult 80 mL/hr at 08/22/13 1726   And  . fat emulsion 250 mL (08/22/13 1727)   Insulin Requirements in the past 24 hours:  4 units - on  SSI moderate scale q8h   IVF: D5 1/2NS w/ 20KCl at 10-20 ml/hr  Current Nutrition:  NPO except ice chips.  NG tube remains in place. Clinimix E 5/15 at 80 ml/hr Lipids 20% at 10 ml/hr  Nutritional Goals:  Re-estimated per RD on 10/16: Kcal: 1750-1950  Protein: 90-105g  Fluid: 1.7-1.9L/day   Clinimix-E 5/15 at 80 mL/hr + Lipids 20% 43mL/hr delivers 96g protein/d, 1843 kcal per day  TPN Access: Double lumen PICC TPN day#: 12   Assessment: 42 y/o F s/p enterolysis of adhesions 07/29/13, presented to ED 08/08/13 with small bowel obstruction. TNA initiated 10/9 -Expl laparotomy 10/13:  partial SB resection due to inflammatory mass of intestinal mesentery, lysis of adhesions -Continuing NG tube until post-operative ileus resolves  Labs:  Electrolytes: Phosphorus slightly elevated (5.4). Using corrected Ca of 10.1,  [Ca][Phos] product = 54.5.  Other electrolytes are WNL.  LFTs: alk phos slightly elevated (< 3x ULN); other LFTs remain below ULN  TG WNL.  Prealbumin: 13.3 (10/10), 18.7 (10/13); today's  value pending  CBG's - WNL with SSI coverage    Plan: At 1800 today:  Change Clinimix to 5/15 plain (no electrolytes) for today due to hyperphosphatemia.  Continue goal rate of 80 mL/hr.  Continue 20% fat emulsion at 70ml/hr daily.  Continue standard multivitamins and trace elements daily.  F/U on today's prealbumin result.  BMet, Phos, Mg tomorrow AM.  TNA lab panels on Mondays & Thursdays.   Elie Goody, PharmD, BCPS Pager: [phone] 08/23/2013  7:00 AM

## 2013-08-24 DIAGNOSIS — R109 Unspecified abdominal pain: Secondary | ICD-10-CM | POA: Diagnosis not present

## 2013-08-24 DIAGNOSIS — Z515 Encounter for palliative care: Secondary | ICD-10-CM | POA: Diagnosis not present

## 2013-08-24 DIAGNOSIS — F411 Generalized anxiety disorder: Secondary | ICD-10-CM | POA: Diagnosis not present

## 2013-08-24 LAB — BASIC METABOLIC PANEL
BUN: 15 mg/dL (ref 6–23)
CO2: 25 mEq/L (ref 19–32)
Creatinine, Ser: 0.81 mg/dL (ref 0.50–1.10)
GFR calc Af Amer: >90 mL/min (ref >90)
GFR calc non Af Amer: 87 mL/min — ABNORMAL LOW (ref >90)
Glucose, Bld: 127 mg/dL — ABNORMAL HIGH (ref 70–99)
Potassium: 4.1 mEq/L (ref 3.5–5.1)

## 2013-08-24 LAB — GLUCOSE, CAPILLARY
Glucose-Capillary: 137 mg/dL — ABNORMAL HIGH (ref 70–99)
Glucose-Capillary: 120 mg/dL — ABNORMAL HIGH (ref 70–99)

## 2013-08-24 MED ORDER — HYDROCORTISONE ACE-PRAMOXINE 2.5-1 % RE CREA
1.0000 "application " | TOPICAL_CREAM | Freq: Three times a day (TID) | RECTAL | Status: AC
  Administered 2013-08-24 – 2013-08-25 (×3): 1 via RECTAL
  Filled 2013-08-24: qty 30

## 2013-08-24 MED ORDER — PHENAZOPYRIDINE HCL 100 MG PO TABS
100.0000 mg | ORAL_TABLET | Freq: Three times a day (TID) | ORAL | Status: AC
  Administered 2013-08-24 – 2013-08-25 (×6): 100 mg via ORAL
  Filled 2013-08-24 (×6): qty 1

## 2013-08-24 MED ORDER — FAT EMULSION 20 % IV EMUL
250.0000 mL | INTRAVENOUS | Status: AC
  Administered 2013-08-24: 250 mL via INTRAVENOUS
  Filled 2013-08-24: qty 250

## 2013-08-24 MED ORDER — BISACODYL 10 MG RE SUPP
10.0000 mg | Freq: Every day | RECTAL | Status: DC
  Administered 2013-08-24: 10 mg via RECTAL
  Filled 2013-08-24: qty 1

## 2013-08-24 MED ORDER — TRACE MINERALS CR-CU-F-FE-I-MN-MO-SE-ZN IV SOLN
INTRAVENOUS | Status: AC
  Administered 2013-08-24: 18:00:00 via INTRAVENOUS
  Filled 2013-08-24: qty 2000

## 2013-08-24 NOTE — Progress Notes (Signed)
PARENTERAL NUTRITION CONSULT NOTE - FOLLOW UP  Pharmacy Consult for TNA Indication: Bowel obstruction  Allergies  Allergen Reactions  . Neurontin [Gabapentin] Other (See Comments)    Other reaction(s): Mental Status Changes (intolerance) hallucinations Sleep walking  . Amoxicillin Hives  . Penicillins Hives  . Latex Rash   Patient Measurements: 08/08/13: Height: 5\' 6"  (167.6 cm) Weight: 164 lb 14.5 oz (74.8 kg) IBW/kg (Calculated) : 59.3 Adjusted Body Weight: 64kg Usual Weight: 75kg  Vital Signs: Temp: 98.3 F (36.8 C) (10/21 0548) Temp src: Oral (10/21 0548) BP: 117/75 mmHg (10/21 0548) Pulse Rate: 106 (10/21 0548) Intake/Output from previous day: 10/20 0701 - 10/21 0700 In: 480 [I.V.:480] Out: 2300 [Urine:2050; Emesis/NG output:250]  Labs:  Recent Labs  08/22/13 0435 08/23/13 0415  WBC 17.7* 16.2*  HGB 9.1* 8.3*  HCT 27.7* 24.7*  PLT 387 340    Recent Labs  08/22/13 0435 08/23/13 0415 08/24/13 0535  NA 136 136 135  K 4.2 4.0 4.1  CL 100 101 99  CO2 24 26 25   GLUCOSE 127* 151* 127*  BUN 15 14 15   CREATININE 0.94 0.75 0.81  CALCIUM 9.9 9.0 9.4  MG  --  2.2 1.9  PHOS  --  5.4* 4.6  PROT 7.7 6.6  --   ALBUMIN 3.0* 2.6*  --   AST 26 28  --   ALT 29 33  --   ALKPHOS 200* 208*  --   BILITOT 0.4 0.3  --   PREALBUMIN  --  13.1*  --   TRIG  --  90  --   Corrected calcium on 10/21: 10.52 Triglycerides on 10/20: 90  Estimated Creatinine Clearance: 91.6 ml/min (by C-G formula based on Cr of 0.81).   Recent Labs  08/23/13 1612 08/23/13 2337 08/24/13 0741  GLUCAP 123* 126* 137*   Medications:  Scheduled:  . bisacodyl  10 mg Rectal Daily  . budesonide-formoterol  2 puff Inhalation BID  . fentaNYL  50 mcg Transdermal Q72H  . fluticasone  2 spray Each Nare Daily  . heparin  5,000 Units Subcutaneous Q8H  . hydrocortisone-pramoxine  1 application Rectal TID  . HYDROmorphone PCA 0.3 mg/mL   Intravenous Q4H  . insulin aspart  0-15 Units  Subcutaneous Q8H  . ipratropium  500 mcg Nebulization BID  . lip balm  1 application Topical BID  . magic mouthwash  15 mL Oral QID  . metoprolol  5 mg Intravenous Q6H  . olopatadine  1 drop Both Eyes BID  . phenazopyridine  100 mg Oral TID   Infusions:  . dextrose 5 % and 0.45 % NaCl with KCl 20 mEq/L 20 mL/hr at 08/23/13 1800  . fat emulsion 250 mL (08/23/13 1739)  . TPN (CLINIMIX) Adult without lytes 80 mL/hr at 08/23/13 1739   Insulin Requirements in the past 24 hours:  6 units - on  SSI moderate scale q8h   IVF: D5 1/2NS w/ 20KCl at 10-20 ml/hr  Current Nutrition:  NPO except ice chips.  NG tube remains in place. Clinimix E 5/15 at 80 ml/hr Lipids 20% at 10 ml/hr  Nutritional Goals:   Re-estimated per RD on 10/16:  1750-1950 kcal/day, 90-105g protein/day, and 1.7-1.9 L fluid/day  Clinimix 5/15 at 80 mL/hr + Lipids 20% 43mL/hr delivers 96g protein/d, 1843 kcal per day  TPN Access: Double lumen PICC TPN day#: 13  Assessment: 42 y/o F s/p enterolysis of adhesions 07/29/13, presented to ED 08/08/13 with small bowel obstruction. TNA initiated 10/9  Expl laparotomy 10/13:  partial SB resection due to inflammatory mass of intestinal mesentery, lysis of adhesions  Continuing NG tube until post-operative ileus resolves  Labs:  Electrolytes: Phosphorus at ULN, but decreased after removing electrolytes 10/20.  Corrected Ca 10.5 at ULN.  [Ca][Phos] product = 48.4.  Other electrolytes are WNL.  LFTs: alk phos slightly elevated (< 3x ULN); other LFTs remain below ULN  TG WNL.  Prealbumin: 13.3 (10/10), 18.7 (10/13), 13.1 (10/21)   CBG's - WNL with SSI coverage    Plan: At 1800 today:  Continue Clinimix 5/15 plain (no electrolytes) for today.  Continue at goal rate of 80 mL/hr.  Continue 20% fat emulsion at 24ml/hr daily.  Continue standard multivitamins and trace elements daily.  BMet, Phos, Mg tomorrow AM.  TNA lab panels on Mondays & Thursdays.   Lynann Beaver PharmD, BCPS Pager [phone] 08/24/2013 9:13 AM

## 2013-08-24 NOTE — Consult Note (Signed)
I have reviewed and discussed the care of this patient in detail with the nurse practitioner including pertinent patient records, physical exam findings and data. I agree with details of this encounter.  

## 2013-08-24 NOTE — Progress Notes (Signed)
Jane English [id number] [birth year]  CARE TEAM:  PCP: Syliva Overman, MD  Outpatient Care Team: Patient Care Team: Kerri Perches, MD as PCP - General Ardeth Sportsman, MD as Consulting Physician (General Surgery) Karn Cassis, MD as Consulting Physician (Neurosurgery) Beverley Fiedler, MD as Consulting Physician (Gastroenterology)  Inpatient Treatment Team: Treatment Team: Attending Provider: Ardeth Sportsman, MD; Registered Nurse: Tristan Schroeder, RN; Consulting Physician: Palliative Doristine Johns; Dietitian: Lavena Bullion, RD; Registered Nurse: Lattie Haw, RN; Technician: Oren Bracket [place], NT; Technician: Valetta Close, [place]; Technician: Melvenia Beam, NT; Technician: Vella Raring, NT   Subjective: Pain scores lower now per RNs - pt still likes PCA Walking a lot in hallways C/o burning urination C/o "my booty hurts"  Objective:  Vital signs:  Filed Vitals:   08/23/13 2208 08/23/13 2342 08/24/13 0400 08/24/13 0548  BP: 110/68   117/75  Pulse: 105   106  Temp: 99.1 F (37.3 C)   98.3 F (36.8 C)  TempSrc: Oral   Oral  Resp: 18 18 21 19   Height:      Weight:      SpO2: 100% 100% 99% 100%    Last BM Date: 08/16/13  Intake/Output   Yesterday:  10/20 0701 - 10/21 0700 In: 480 [I.V.:480] Out: 2300 [Urine:2050; Emesis/NG output:250] This shift:  Total I/O In: 240 [I.V.:240] Out: 800 [Urine:800]  Bowel function:  Flatus: NO  BM: Pt denies.  Drain: Thin bilious  Physical Exam:  General: Pt awake/alert/oriented x4 in mild distress.  Alert Eyes: PERRL, normal EOM.  Sclera clear.  No icterus Neuro: CN II-XII intact w/o focal sensory/motor deficits. Lymph: No head/neck/groin lymphadenopathy Psych:  No delerium/psychosis/paranoia.  Smiling more. HENT: Normocephalic, Mucus membranes moist.  No thrush.  NGT in place Neck: Supple, No tracheal deviation Chest: No chest wall pain w good excursion CV:  Pulses intact.  Regular  rhythm MS: Normal AROM mjr joints.  No obvious deformity Abdomen: Soft.  Obese.  Nondistended.  Sensitive at incision.  Incisions clean.   Mild erythema at corner 2 cm - no fluctuance.  No incarcerated hernias.   Ext:  SCDs BLE.  No mjr edema.  No cyanosis Skin: No petechiae / purpura   Problem List:   Principal Problem:   SBO (small bowel obstruction) Active Problems:   Chronic pain syndrome   Anxiety state, unspecified   DEPRESSION, CHRONIC   Chronic constipation   PTSD (post-traumatic stress disorder)   Chronic migraine without aura   Palliative care encounter   Abdominal pain, unspecified site   Diabetes  SURGERY 07/29/2013 POST-OPERATIVE DIAGNOSIS: recurrent abdominal pain,extensive adhesions   PROCEDURE: Procedure(s):  diagnostic laporoscopy  LYSIS OF ADHESIONS  serosal repair  SURGEON: Surgeon(s):  Ardeth Sportsman, MD  Kandis Cocking, MD - Asst      Assessment  Jane English  42 y.o. female  8 Days Post-Op    Partial small bowel obstruction in patient with chronic pain on chronic narcotics s/p significant adhesiolysis & repeat exlap with SB resection  Plan:  -Continue IV fluid hydration.  TNA  -Continue nasogastric tube until postoperative ileus resolved.  Lower nasogastric tube output with soft abdomen a hopeful sign.  Check CXR/AXR to evaluate  -Dilaudid PCA.  ice/heat RTC.  Robaxin PRN.  Refused fentanyl patch  -PRN nausea control.   -palliate sore throat  -sitz baths.  Analpram  -UA negative - follow  -VTE prophylaxis- SCDs, etc  -mobilize as tolerated to help  recovery.  The more she can walk, the better.  -?control depression.  Difficult with ileus/SBO.  D/w Palliative Care team in past.  She needs to get back to her chronic pain specialist at [facility] at some point, but this is not a great time to radically change her regimen if possible.     -I updated the patient's status to the patient & her husband.  Recommendations were made.   Questions were answered.  They expressed understanding & appreciation.    Ardeth Sportsman, M.D., F.A.C.S. Gastrointestinal and Minimally Invasive Surgery [facility] [address] [place], [place] [address] [phone] Main / Paging   08/24/2013   Results:   Labs: Results for orders placed during the hospital encounter of 08/08/13 (from the past 48 hour(s))  GLUCOSE, CAPILLARY     Status: Abnormal   Collection Time    08/22/13  9:02 AM      Result Value Range   Glucose-Capillary 136 (*) 70 - 99 mg/dL  URINALYSIS, ROUTINE W REFLEX MICROSCOPIC     Status: Abnormal   Collection Time    08/22/13  5:03 PM      Result Value Range   Color, Urine AMBER (*) YELLOW   Comment: BIOCHEMICALS MAY BE AFFECTED BY COLOR   APPearance CLEAR  CLEAR   Specific Gravity, Urine 1.031 (*) 1.005 - 1.030   pH 5.5  5.0 - 8.0   Glucose, UA NEGATIVE  NEGATIVE mg/dL   Hgb urine dipstick NEGATIVE  NEGATIVE   Bilirubin Urine NEGATIVE  NEGATIVE   Ketones, ur NEGATIVE  NEGATIVE mg/dL   Protein, ur NEGATIVE  NEGATIVE mg/dL   Urobilinogen, UA 1.0  0.0 - 1.0 mg/dL   Nitrite NEGATIVE  NEGATIVE   Leukocytes, UA NEGATIVE  NEGATIVE   Comment: MICROSCOPIC NOT DONE ON URINES WITH NEGATIVE PROTEIN, BLOOD, LEUKOCYTES, NITRITE, OR GLUCOSE <1000 mg/dL.  URINE CULTURE     Status: None   Collection Time    08/22/13  5:03 PM      Result Value Range   Specimen Description URINE, CATHETERIZED     Special Requests none Normal     Culture  Setup Time       Value: 08/22/2013 20:43     Performed at [facility] Count       Value: NO GROWTH     Performed at [facility]   Culture       Value: NO GROWTH     Performed at [facility]   Report Status 08/23/2013 FINAL    GLUCOSE, CAPILLARY     Status: Abnormal   Collection Time    08/22/13  5:29 PM      Result Value Range   Glucose-Capillary 115 (*) 70 - 99 mg/dL  GLUCOSE, CAPILLARY     Status:  Abnormal   Collection Time    08/22/13 11:23 PM      Result Value Range   Glucose-Capillary 133 (*) 70 - 99 mg/dL  COMPREHENSIVE METABOLIC PANEL     Status: Abnormal   Collection Time    08/23/13  4:15 AM      Result Value Range   Sodium 136  135 - 145 mEq/L   Potassium 4.0  3.5 - 5.1 mEq/L   Chloride 101  96 - 112 mEq/L   CO2 26  19 - 32 mEq/L   Glucose, Bld 151 (*) 70 - 99 mg/dL   BUN 14  6 - 23 mg/dL   Creatinine, Ser 4.09  0.50 - 1.10 mg/dL   Calcium 9.0  8.4 - 81.1 mg/dL   Total Protein 6.6  6.0 - 8.3 g/dL   Albumin 2.6 (*) 3.5 - 5.2 g/dL   AST 28  0 - 37 U/L   ALT 33  0 - 35 U/L   Alkaline Phosphatase 208 (*) 39 - 117 U/L   Total Bilirubin 0.3  0.3 - 1.2 mg/dL   GFR calc non Af Amer >90  >90 mL/min   GFR calc Af Amer >90  >90 mL/min   Comment: (NOTE)     The eGFR has been calculated using the CKD EPI equation.     This calculation has not been validated in all clinical situations.     eGFR's persistently <90 mL/min signify possible Chronic Kidney     Disease.  MAGNESIUM     Status: None   Collection Time    08/23/13  4:15 AM      Result Value Range   Magnesium 2.2  1.5 - 2.5 mg/dL  PHOSPHORUS     Status: Abnormal   Collection Time    08/23/13  4:15 AM      Result Value Range   Phosphorus 5.4 (*) 2.3 - 4.6 mg/dL  CBC     Status: Abnormal   Collection Time    08/23/13  4:15 AM      Result Value Range   WBC 16.2 (*) 4.0 - 10.5 K/uL   RBC 2.67 (*) 3.87 - 5.11 MIL/uL   Hemoglobin 8.3 (*) 12.0 - 15.0 g/dL   HCT 91.4 (*) 78.2 - 95.6 %   MCV 92.5  78.0 - 100.0 fL   MCH 31.1  26.0 - 34.0 pg   MCHC 33.6  30.0 - 36.0 g/dL   RDW 21.3  08.6 - 57.8 %   Platelets 340  150 - 400 K/uL  DIFFERENTIAL     Status: Abnormal   Collection Time    08/23/13  4:15 AM      Result Value Range   Neutrophils Relative % 74  43 - 77 %   Lymphocytes Relative 12  12 - 46 %   Monocytes Relative 9  3 - 12 %   Eosinophils Relative 4  0 - 5 %   Basophils Relative 1  0 - 1 %   Neutro Abs  12.0 (*) 1.7 - 7.7 K/uL   Lymphs Abs 1.9  0.7 - 4.0 K/uL   Monocytes Absolute 1.5 (*) 0.1 - 1.0 K/uL   Eosinophils Absolute 0.6  0.0 - 0.7 K/uL   Basophils Absolute 0.2 (*) 0.0 - 0.1 K/uL   WBC Morphology MILD LEFT SHIFT (1-5% METAS, OCC MYELO, OCC BANDS)    TRIGLYCERIDES     Status: None   Collection Time    08/23/13  4:15 AM      Result Value Range   Triglycerides 90  <150 mg/dL   Comment: Performed at [facility]  PREALBUMIN     Status: Abnormal   Collection Time    08/23/13  4:15 AM      Result Value Range   Prealbumin 13.1 (*) 17.0 - 34.0 mg/dL   Comment: Performed at [facility]  GLUCOSE, CAPILLARY     Status: Abnormal   Collection Time    08/23/13  7:32 AM      Result Value Range   Glucose-Capillary 124 (*) 70 - 99 mg/dL  GLUCOSE, CAPILLARY  Status: Abnormal   Collection Time    08/23/13  4:12 PM      Result Value Range   Glucose-Capillary 123 (*) 70 - 99 mg/dL  GLUCOSE, CAPILLARY     Status: Abnormal   Collection Time    08/23/13 11:37 PM      Result Value Range   Glucose-Capillary 126 (*) 70 - 99 mg/dL  BASIC METABOLIC PANEL     Status: Abnormal   Collection Time    08/24/13  5:35 AM      Result Value Range   Sodium 135  135 - 145 mEq/L   Potassium 4.1  3.5 - 5.1 mEq/L   Chloride 99  96 - 112 mEq/L   CO2 25  19 - 32 mEq/L   Glucose, Bld 127 (*) 70 - 99 mg/dL   BUN 15  6 - 23 mg/dL   Creatinine, Ser 4.54  0.50 - 1.10 mg/dL   Calcium 9.4  8.4 - 09.8 mg/dL   GFR calc non Af Amer 87 (*) >90 mL/min   GFR calc Af Amer >90  >90 mL/min   Comment: (NOTE)     The eGFR has been calculated using the CKD EPI equation.     This calculation has not been validated in all clinical situations.     eGFR's persistently <90 mL/min signify possible Chronic Kidney     Disease.  PHOSPHORUS     Status: None   Collection Time    08/24/13  5:35 AM      Result Value Range   Phosphorus 4.6  2.3 - 4.6 mg/dL  MAGNESIUM     Status: None   Collection Time     08/24/13  5:35 AM      Result Value Range   Magnesium 1.9  1.5 - 2.5 mg/dL    Imaging / Studies: Dg Chest Port 1 View  08/23/2013   CLINICAL DATA:  Chest pain  EXAM: PORTABLE CHEST - 1 VIEW  COMPARISON:  08/19/2013  FINDINGS: Cardiomediastinal silhouette is stable. No acute infiltrate or pulmonary edema. Right arm PICC line is unchanged in position. Stable NG tube position. Spinal stimulation wires are unchanged in position.  IMPRESSION: No active disease.  Stable NG tube and right arm PICC line position.   Electronically Signed   By: Natasha Mead M.D.   On: 08/23/2013 11:19   Dg Abd Portable 2v  08/23/2013   CLINICAL DATA:  Postop. Abdominal pain.  EXAM: PORTABLE ABDOMEN - 2 VIEW  COMPARISON:  08/14/2013  FINDINGS: Air is seen within a normal size: Across the transverse colon primarily. There is air within a mildly prominent loop of small bowel in the left mid abdomen, significantly decreased in caliber when compared to the prior exam. On the decubitus view, there is no free air. There are scattered air-fluid levels within nondistended bowel.  A nasogastric tube is well positioned with its tip in the distal stomach.  There is a spine stimulator superimposed over the right lower quadrant. Changes from a previous lower lumbar spine fusion are noted, stable.  IMPRESSION: 1. There are air-fluid levels on the decubitus view and a mildly prominent loop of small bowel in the left mid abdomen. This is most likely a mild postoperative adynamic ileus. No convincing residual bowel obstruction. 2. There has been significant improvement when compared the prior study. 3. No free air.   Electronically Signed   By: Amie Portland M.D.   On: 08/23/2013 12:06    Medications /  Allergies: per chart  Antibiotics: Anti-infectives   Start     Dose/Rate Route Frequency Ordered Stop   08/17/13 0600  clindamycin (CLEOCIN) IVPB 600 mg  Status:  Discontinued    Comments:  Pharmacy may adjust dosing strength, interval, or  rate of medication as needed for optimal therapy for the patient Send with patient on call to the OR.  Anesthesia to complete antibiotic administration <60min prior to incision per [facility].   600 mg 100 mL/hr over 30 Minutes Intravenous On call to O.R. 08/16/13 1720 08/17/13 0740   08/17/13 0600  gentamicin (GARAMYCIN) IVPB 100 mg  Status:  Discontinued    Comments:  Pharmacy may adjust dosing strength, schedule, rate of infusion, etc as needed to optimize therapy Send with patient on call to the OR.  Anesthesia to complete antibiotic administration <52min prior to incision per [facility].   100 mg 200 mL/hr over 30 Minutes Intravenous On call to O.R. 08/16/13 1720 08/16/13 1732   08/16/13 1732  gentamicin (GARAMYCIN) 370 mg in dextrose 5 % 100 mL IVPB     5 mg/kg  74.8 kg 218.5 mL/hr over 30 Minutes Intravenous 30 min pre-op 08/16/13 1731 08/16/13 1836

## 2013-08-25 LAB — BASIC METABOLIC PANEL
BUN: 15 mg/dL (ref 6–23)
GFR calc non Af Amer: 82 mL/min — ABNORMAL LOW (ref >90)
Glucose, Bld: 132 mg/dL — ABNORMAL HIGH (ref 70–99)
Potassium: 3.6 mEq/L (ref 3.5–5.1)
Sodium: 134 mEq/L — ABNORMAL LOW (ref 135–145)

## 2013-08-25 LAB — PHOSPHORUS: Phosphorus: 4.5 mg/dL (ref 2.3–4.6)

## 2013-08-25 LAB — GLUCOSE, CAPILLARY: Glucose-Capillary: 132 mg/dL — ABNORMAL HIGH (ref 70–99)

## 2013-08-25 LAB — MAGNESIUM: Magnesium: 1.9 mg/dL (ref 1.5–2.5)

## 2013-08-25 MED ORDER — FAT EMULSION 20 % IV EMUL
250.0000 mL | INTRAVENOUS | Status: AC
  Administered 2013-08-25: 250 mL via INTRAVENOUS
  Filled 2013-08-25: qty 250

## 2013-08-25 MED ORDER — SODIUM CHLORIDE 0.9 % IJ SOLN: 9.0000 mL | INTRAMUSCULAR | Status: DC | PRN

## 2013-08-25 MED ORDER — HYDROMORPHONE HCL PF 1 MG/ML IJ SOLN: 1.0000 mg | INTRAMUSCULAR | Status: DC | PRN

## 2013-08-25 MED ORDER — ONDANSETRON HCL 4 MG/2ML IJ SOLN: 4.0000 mg | Freq: Four times a day (QID) | INTRAMUSCULAR | Status: DC | PRN

## 2013-08-25 MED ORDER — HYDROMORPHONE 0.3 MG/ML IV SOLN
INTRAVENOUS | Status: DC
  Administered 2013-08-25: 17:00:00 via INTRAVENOUS
  Administered 2013-08-25: 0.899 mg via INTRAVENOUS
  Administered 2013-08-26: 1.49 mg via INTRAVENOUS
  Administered 2013-08-26: 1.5 mg via INTRAVENOUS
  Filled 2013-08-25: qty 25

## 2013-08-25 MED ORDER — NALOXONE HCL 0.4 MG/ML IJ SOLN: 0.4000 mg | INTRAMUSCULAR | Status: DC | PRN

## 2013-08-25 MED ORDER — DIPHENHYDRAMINE HCL 50 MG/ML IJ SOLN: 12.5000 mg | Freq: Four times a day (QID) | INTRAMUSCULAR | Status: DC | PRN

## 2013-08-25 MED ORDER — TRACE MINERALS CR-CU-F-FE-I-MN-MO-SE-ZN IV SOLN
INTRAVENOUS | Status: AC
  Administered 2013-08-25: 18:00:00 via INTRAVENOUS
  Filled 2013-08-25: qty 2000

## 2013-08-25 MED ORDER — DIPHENHYDRAMINE HCL 12.5 MG/5ML PO ELIX: 12.5000 mg | ORAL_SOLUTION | Freq: Four times a day (QID) | ORAL | Status: DC | PRN

## 2013-08-25 NOTE — Progress Notes (Signed)
NUTRITION FOLLOW UP  Intervention:   - TPN per pharmacy - Recommend nursing get updated weight - Diet advancement per MD - Will continue to monitor  Nutrition Dx:   Inadequate oral intake related to inability to eat as evidenced by NPO - ongoing   Goal:   TPN to meet >90% of estimated nutritional needs - met   Monitor:   Weights, labs, NGT output, BM, TPN, diet advancement  Assessment:   Pt with recent laparoscopic surgery 07/30/13, admitted with abdominal pain, bloating, nausea, and vomiting. Eating was making the pain worse PTA. Was unable to keep liquids down and had not tried foods. Pt on TPN. Pt with partial small bowel obstruction. S/p lysis of adhesions and small bowel resection 10/13. Pt with prolonged ileus.   Pt being followed by palliative care. Met with pt who denies any nausea, has been up walking the halls. NGT output total from 10/20-10/21. No new weights. Had small loose black stool yesterday.   TPN: Clinimix-E 5/15 at 80 mL/hr + Lipids 20% 54mL/hr daily provides 96 grams protein and 1843 kcal per day meeting 95% estimated calorie needs, 91% of estimated protein needs.   - Pt with slightly low sodium - Alk phos elevated  - PALB low and trending down  - CBGs < 150 mg/dL  CBG (last 3)   Recent Labs  08/24/13 1702 08/24/13 2349 08/25/13 0723  GLUCAP 120* 132* 132*     Height: Ht Readings from Last 1 Encounters:  08/08/13 5\' 6"  (1.676 m)    Weight Status:   Wt Readings from Last 1 Encounters:  08/08/13 164 lb 14.5 oz (74.8 kg)    Re-estimated needs:  Kcal: 1750-1950 Protein: 90-105g  Fluid: 1.7-1.9L/day   Skin: Abdominal incision   Diet Order: NPO   Intake/Output Summary (Last 24 hours) at 08/25/13 1239 Last data filed at 08/25/13 1000  Gross per 24 hour  Intake   1134 ml  Output   1775 ml  Net   -641 ml    Last BM: 10/21   Labs:   Recent Labs Lab 08/23/13 0415 08/24/13 0535 08/25/13 0530  NA 136 135 134*  K 4.0 4.1 3.6   CL 101 99 99  CO2 26 25 24   BUN 14 15 15   CREATININE 0.75 0.81 0.85  CALCIUM 9.0 9.4 9.6  MG 2.2 1.9 1.9  PHOS 5.4* 4.6 4.5  GLUCOSE 151* 127* 132*    CBG (last 3)   Recent Labs  08/24/13 1702 08/24/13 2349 08/25/13 0723  GLUCAP 120* 132* 132*    Scheduled Meds: . bisacodyl  10 mg Rectal Daily  . budesonide-formoterol  2 puff Inhalation BID  . fentaNYL  50 mcg Transdermal Q72H  . fluticasone  2 spray Each Nare Daily  . heparin  5,000 Units Subcutaneous Q8H  . hydrocortisone-pramoxine  1 application Rectal TID  . HYDROmorphone PCA 0.3 mg/mL   Intravenous Q4H  . insulin aspart  0-15 Units Subcutaneous Q8H  . ipratropium  500 mcg Nebulization BID  . lip balm  1 application Topical BID  . magic mouthwash  15 mL Oral QID  . metoprolol  5 mg Intravenous Q6H  . olopatadine  1 drop Both Eyes BID  . phenazopyridine  100 mg Oral TID    Continuous Infusions: . dextrose 5 % and 0.45 % NaCl with KCl 20 mEq/L 20 mL/hr at 08/23/13 1800  . TPN (CLINIMIX) Adult without lytes 80 mL/hr at 08/24/13 1758   And  .  fat emulsion 250 mL (08/24/13 1758)  . TPN (CLINIMIX) Adult without lytes     And  . fat emulsion       Levon Hedger MS, RD, LDN [phone] Pager [phone] After Hours Pager

## 2013-08-25 NOTE — Progress Notes (Signed)
   Introduced patient to relaxation breathing with nature sounds as a strategy for pain control and technique to decrease anxiety.  30 minutes session with guided imagery and hand massage.  She responded very well reporting positive effects and interest in further use.  Will continue to support holistically and make recommendation for pain management strategy once medically stable  Total time spent with the patient was 35 min.  Time in 1400 time out 1435  Greater than 50 % time spent in counseling and coordination of care   Lorinda Creed NP  Palliative Medicine Team Team Phone # [phone] Pager [phone]

## 2013-08-25 NOTE — Progress Notes (Signed)
PARENTERAL NUTRITION CONSULT NOTE - FOLLOW UP  Pharmacy Consult for TNA Indication: Bowel obstruction  Allergies  Allergen Reactions  . Neurontin [Gabapentin] Other (See Comments)    Other reaction(s): Mental Status Changes (intolerance) hallucinations Sleep walking  . Amoxicillin Hives  . Penicillins Hives  . Latex Rash   Patient Measurements: 08/08/13: Height: 5\' 6"  (167.6 cm) Weight: 164 lb 14.5 oz (74.8 kg) IBW/kg (Calculated) : 59.3 Adjusted Body Weight: 64kg Usual Weight: 75kg  Vital Signs: Temp: 98.5 F (36.9 C) (10/22 1000) Temp src: Oral (10/22 1000) BP: 117/60 mmHg (10/22 1000) Pulse Rate: 97 (10/22 1000) Intake/Output from previous day: 10/21 0701 - 10/22 0700 In: 1574 [P.O.:14; I.V.:480; TPN:1080] Out: 1600 [Urine:1600]  Labs:  Recent Labs  08/23/13 0415  WBC 16.2*  HGB 8.3*  HCT 24.7*  PLT 340    Recent Labs  08/23/13 0415 08/24/13 0535 08/25/13 0530  NA 136 135 134*  K 4.0 4.1 3.6  CL 101 99 99  CO2 26 25 24   GLUCOSE 151* 127* 132*  BUN 14 15 15   CREATININE 0.75 0.81 0.85  CALCIUM 9.0 9.4 9.6  MG 2.2 1.9 1.9  PHOS 5.4* 4.6 4.5  PROT 6.6  --   --   ALBUMIN 2.6*  --   --   AST 28  --   --   ALT 33  --   --   ALKPHOS 208*  --   --   BILITOT 0.3  --   --   PREALBUMIN 13.1*  --   --   TRIG 90  --   --    Estimated Creatinine Clearance: 87.3 ml/min (by C-G formula based on Cr of 0.85).   Recent Labs  08/24/13 1702 08/24/13 2349 08/25/13 0723  GLUCAP 120* 132* 132*   Insulin Requirements in the past 24 hours:  6 units - on  SSI moderate scale q8h   IVF: D5 1/2NS w/ 20KCl at 10-20 ml/hr  Current Nutrition:  NPO except ice chips.  NG tube remains in place. Clinimix 5/15 at 80 ml/hr Lipids 20% at 10 ml/hr  Nutritional Goals:   Re-estimated per RD on 10/16:  1750-1950 kcal/day, 90-105g protein/day, and 1.7-1.9 L fluid/day  Clinimix 5/15 at 80 mL/hr + Lipids 20% 30mL/hr delivers 96g protein/d, 1843 kcal per day  TPN  Access: Double lumen PICC TPN day#: 14  Assessment: 42 y/o F s/p enterolysis of adhesions 07/29/13, presented to ED 08/08/13 with small bowel obstruction. TNA initiated 10/9  Expl laparotomy 10/13:  partial SB resection due to inflammatory mass of intestinal mesentery, lysis of adhesions  Continuing NG tube until post-operative ileus resolves.  BM x 1 recorded yesterday.  Surgery has yet to round today - will f/u their recommendations for nutrition administration.    Labs:  Electrolytes: Phosphorus improving (now WNL) after removing electrolytes from TNA 10/20.  Corrected Ca remains elevated 10.7 at ULN.  [Ca][Phos] product = 48.  Na 134.  LFTs: alk phos slightly elevated (< 3x ULN); other LFTs remain below ULN  TG WNL.  Prealbumin: 13.3 (10/10), 18.7 (10/13), 13.1 (10/21)   CBG's - WNL with SSI coverage    Plan: At 1800 today:  Continue Clinimix 5/15 plain (no electrolytes) for today.  May consider adding back electrolytes in TNA tomorrow if Phos remains WNL since Na low and K+ at lower end of normal.  Continue at goal rate of 80 mL/hr.  Continue 20% fat emulsion at 52ml/hr daily.  Continue standard multivitamins and trace elements  daily.  TNA lab panel in AM   Clance Boll, PharmD, BCPS Pager: [phone] 08/25/2013 12:31 PM

## 2013-08-25 NOTE — Progress Notes (Signed)
Jane English [id number] 1971-01-17  CARE TEAM:  PCP: Syliva Overman, MD  Outpatient Care Team: Patient Care Team: Kerri Perches, MD as PCP - General Ardeth Sportsman, MD as Consulting Physician (General Surgery) Karn Cassis, MD as Consulting Physician (Neurosurgery) Beverley Fiedler, MD as Consulting Physician (Gastroenterology)  Inpatient Treatment Team: Treatment Team: Attending Provider: Ardeth Sportsman, MD; Registered Nurse: Tristan Schroeder, RN; Consulting Physician: Palliative Doristine Johns; Dietitian: Lavena Bullion, RD; Registered Nurse: Lattie Haw, RN; Technician: Oren Bracket [place], NT; Technician: Valetta Close, [place]; Technician: Melvenia Beam, NT; Registered Nurse: Dina Rich, RN; Technician: Fredricka Bonine, NT; Technician: Vella Raring, NT   Subjective: Pain still - at incision - pt still likes PCA  Objective:  Vital signs:  Filed Vitals:   08/25/13 0639 08/25/13 0813 08/25/13 1000 08/25/13 1155  BP: 106/68  117/60   Pulse: 105  97   Temp: 100.5 F (38.1 C)  98.5 F (36.9 C)   TempSrc: Oral  Oral   Resp: 20  20   Height:      Weight:      SpO2: 99% 99% 97% 95%    Last BM Date: 08/24/13  Intake/Output   Yesterday:  10/21 0701 - 10/22 0700 In: 1574 [P.O.:14; I.V.:480; TPN:1080] Out: 1600 [Urine:1600] This shift:  Total I/O In: 0  Out: 475 [Urine:475]  Bowel function:  Flatus: YES  BM: YES x2.  Drain: Thin bilious  Physical Exam:  General: Pt awake/alert/oriented x4 in mild distress.  Alert Eyes: PERRL, normal EOM.  Sclera clear.  No icterus Neuro: CN II-XII intact w/o focal sensory/motor deficits. Lymph: No head/neck/groin lymphadenopathy Psych:  No delerium/psychosis/paranoia.  Smiling more. HENT: Normocephalic, Mucus membranes moist.  No thrush.  NGT in place Neck: Supple, No tracheal deviation Chest: No chest wall pain w good excursion CV:  Pulses intact.  Regular rhythm MS: Normal AROM mjr  joints.  No obvious deformity Abdomen: Soft.  Obese.  Nondistended.  Sensitive at incision.  Incisions clean.   Mild erythema at corner 1 cm - improved & no fluctuance.  No incarcerated hernias.   Ext:  SCDs BLE.  No mjr edema.  No cyanosis Skin: No petechiae / purpura   Problem List:   Principal Problem:   SBO (small bowel obstruction) Active Problems:   Chronic pain syndrome   Anxiety state, unspecified   DEPRESSION, CHRONIC   Chronic constipation   PTSD (post-traumatic stress disorder)   Chronic migraine without aura   Palliative care encounter   Abdominal pain, unspecified site   Diabetes  SURGERY 07/29/2013 POST-OPERATIVE DIAGNOSIS: recurrent abdominal pain,extensive adhesions   PROCEDURE: Procedure(s):  diagnostic laporoscopy  LYSIS OF ADHESIONS  serosal repair  SURGEON: Surgeon(s):  Ardeth Sportsman, MD  Kandis Cocking, MD - Asst      Assessment  Jane English  42 y.o. female  9 Days Post-Op    Partial small bowel obstruction in patient with chronic pain on chronic narcotics s/p significant adhesiolysis & repeat exlap with SB resection  Plan:  -Continue IV fluid hydration.  TNA  -Try clears.  If tolerates, d/c NGT in AM  -Dilaudid PCA - wean interval.  ice/heat RTC.  Robaxin PRN.  Refused fentanyl patch  -PRN nausea control.   -palliate sore throat  -sitz baths.  Analpram  -UA negative - follow  -VTE prophylaxis- SCDs, etc  -mobilize as tolerated to help recovery.  The more she can walk, the better.  -?control  depression.  Difficult with ileus/SBO.  D/w Palliative Care team in past.  She needs to get back to her chronic pain specialist at [facility] at some point, but this is not a great time to radically change her regimen if possible.     -I updated the patient's status to the patient.  Recommendations were made.  Questions were answered.  They expressed understanding & appreciation.    Ardeth Sportsman, M.D., F.A.C.S. Gastrointestinal and  Minimally Invasive Surgery [facility] [address] [place], [place] [address] [phone] Main / Paging   08/25/2013   Results:   Labs: Results for orders placed during the hospital encounter of 08/08/13 (from the past 48 hour(s))  GLUCOSE, CAPILLARY     Status: Abnormal   Collection Time    08/23/13  4:12 PM      Result Value Range   Glucose-Capillary 123 (*) 70 - 99 mg/dL  GLUCOSE, CAPILLARY     Status: Abnormal   Collection Time    08/23/13 11:37 PM      Result Value Range   Glucose-Capillary 126 (*) 70 - 99 mg/dL  BASIC METABOLIC PANEL     Status: Abnormal   Collection Time    08/24/13  5:35 AM      Result Value Range   Sodium 135  135 - 145 mEq/L   Potassium 4.1  3.5 - 5.1 mEq/L   Chloride 99  96 - 112 mEq/L   CO2 25  19 - 32 mEq/L   Glucose, Bld 127 (*) 70 - 99 mg/dL   BUN 15  6 - 23 mg/dL   Creatinine, Ser 8.29  0.50 - 1.10 mg/dL   Calcium 9.4  8.4 - 56.2 mg/dL   GFR calc non Af Amer 87 (*) >90 mL/min   GFR calc Af Amer >90  >90 mL/min   Comment: (NOTE)     The eGFR has been calculated using the CKD EPI equation.     This calculation has not been validated in all clinical situations.     eGFR's persistently <90 mL/min signify possible Chronic Kidney     Disease.  PHOSPHORUS     Status: None   Collection Time    08/24/13  5:35 AM      Result Value Range   Phosphorus 4.6  2.3 - 4.6 mg/dL  MAGNESIUM     Status: None   Collection Time    08/24/13  5:35 AM      Result Value Range   Magnesium 1.9  1.5 - 2.5 mg/dL  GLUCOSE, CAPILLARY     Status: Abnormal   Collection Time    08/24/13  7:41 AM      Result Value Range   Glucose-Capillary 137 (*) 70 - 99 mg/dL  GLUCOSE, CAPILLARY     Status: Abnormal   Collection Time    08/24/13  5:02 PM      Result Value Range   Glucose-Capillary 120 (*) 70 - 99 mg/dL  GLUCOSE, CAPILLARY     Status: Abnormal   Collection Time    08/24/13 11:49 PM      Result Value Range    Glucose-Capillary 132 (*) 70 - 99 mg/dL  BASIC METABOLIC PANEL     Status: Abnormal   Collection Time    08/25/13  5:30 AM      Result Value Range   Sodium 134 (*) 135 - 145 mEq/L   Potassium 3.6  3.5 - 5.1  mEq/L   Chloride 99  96 - 112 mEq/L   CO2 24  19 - 32 mEq/L   Glucose, Bld 132 (*) 70 - 99 mg/dL   BUN 15  6 - 23 mg/dL   Creatinine, Ser 6.44  0.50 - 1.10 mg/dL   Calcium 9.6  8.4 - 03.4 mg/dL   GFR calc non Af Amer 82 (*) >90 mL/min   GFR calc Af Amer >90  >90 mL/min   Comment: (NOTE)     The eGFR has been calculated using the CKD EPI equation.     This calculation has not been validated in all clinical situations.     eGFR's persistently <90 mL/min signify possible Chronic Kidney     Disease.  MAGNESIUM     Status: None   Collection Time    08/25/13  5:30 AM      Result Value Range   Magnesium 1.9  1.5 - 2.5 mg/dL  PHOSPHORUS     Status: None   Collection Time    08/25/13  5:30 AM      Result Value Range   Phosphorus 4.5  2.3 - 4.6 mg/dL  GLUCOSE, CAPILLARY     Status: Abnormal   Collection Time    08/25/13  7:23 AM      Result Value Range   Glucose-Capillary 132 (*) 70 - 99 mg/dL   Comment 1 Documented in Chart     Comment 2 Notify RN      Imaging / Studies: No results found.  Medications / Allergies: per chart  Antibiotics: Anti-infectives   Start     Dose/Rate Route Frequency Ordered Stop   08/17/13 0600  clindamycin (CLEOCIN) IVPB 600 mg  Status:  Discontinued    Comments:  Pharmacy may adjust dosing strength, interval, or rate of medication as needed for optimal therapy for the patient Send with patient on call to the OR.  Anesthesia to complete antibiotic administration <33min prior to incision per [facility].   600 mg 100 mL/hr over 30 Minutes Intravenous On call to O.R. 08/16/13 1720 08/17/13 0740   08/17/13 0600  gentamicin (GARAMYCIN) IVPB 100 mg  Status:  Discontinued    Comments:  Pharmacy may adjust dosing strength, schedule, rate of  infusion, etc as needed to optimize therapy Send with patient on call to the OR.  Anesthesia to complete antibiotic administration <25min prior to incision per [facility].   100 mg 200 mL/hr over 30 Minutes Intravenous On call to O.R. 08/16/13 1720 08/16/13 1732   08/16/13 1732  gentamicin (GARAMYCIN) 370 mg in dextrose 5 % 100 mL IVPB     5 mg/kg  74.8 kg 218.5 mL/hr over 30 Minutes Intravenous 30 min pre-op 08/16/13 1731 08/16/13 1836

## 2013-08-26 ENCOUNTER — Ambulatory Visit (HOSPITAL_COMMUNITY): Payer: Self-pay | Admitting: Psychiatry

## 2013-08-26 DIAGNOSIS — G894 Chronic pain syndrome: Secondary | ICD-10-CM | POA: Diagnosis not present

## 2013-08-26 DIAGNOSIS — F411 Generalized anxiety disorder: Secondary | ICD-10-CM | POA: Diagnosis not present

## 2013-08-26 DIAGNOSIS — Z515 Encounter for palliative care: Secondary | ICD-10-CM | POA: Diagnosis not present

## 2013-08-26 LAB — COMPREHENSIVE METABOLIC PANEL
ALT: 32 U/L (ref 0–35)
AST: 18 U/L (ref 0–37)
Albumin: 2.9 g/dL — ABNORMAL LOW (ref 3.5–5.2)
Alkaline Phosphatase: 210 U/L — ABNORMAL HIGH (ref 39–117)
BUN: 17 mg/dL (ref 6–23)
CO2: 24 mEq/L (ref 19–32)
Chloride: 101 mEq/L (ref 96–112)
GFR calc Af Amer: >90 mL/min (ref >90)
GFR calc non Af Amer: >90 mL/min (ref >90)
Glucose, Bld: 97 mg/dL (ref 70–99)
Potassium: 3.5 mEq/L (ref 3.5–5.1)
Total Bilirubin: 0.2 mg/dL — ABNORMAL LOW (ref 0.3–1.2)
Total Protein: 7.5 g/dL (ref 6.0–8.3)

## 2013-08-26 LAB — GLUCOSE, CAPILLARY
Glucose-Capillary: 126 mg/dL — ABNORMAL HIGH (ref 70–99)
Glucose-Capillary: 128 mg/dL — ABNORMAL HIGH (ref 70–99)
Glucose-Capillary: 130 mg/dL — ABNORMAL HIGH (ref 70–99)

## 2013-08-26 LAB — MAGNESIUM: Magnesium: 1.8 mg/dL (ref 1.5–2.5)

## 2013-08-26 MED ORDER — BISACODYL 10 MG RE SUPP: 10.0000 mg | Freq: Every day | RECTAL | Status: DC | PRN

## 2013-08-26 MED ORDER — CLINIMIX/DEXTROSE (5/15) 5 % IV SOLN
INTRAVENOUS | Status: AC
  Administered 2013-08-26: 17:00:00 via INTRAVENOUS
  Filled 2013-08-26: qty 2000

## 2013-08-26 MED ORDER — ENSURE COMPLETE PO LIQD
237.0000 mL | Freq: Three times a day (TID) | ORAL | Status: DC
  Administered 2013-08-26 – 2013-08-31 (×13): 237 mL via ORAL

## 2013-08-26 MED ORDER — ACETAMINOPHEN 500 MG PO TABS
1000.0000 mg | ORAL_TABLET | Freq: Three times a day (TID) | ORAL | Status: DC
  Administered 2013-08-26 – 2013-08-27 (×4): 1000 mg via ORAL
  Filled 2013-08-26 (×6): qty 2

## 2013-08-26 MED ORDER — AMLODIPINE BESYLATE 10 MG PO TABS
10.0000 mg | ORAL_TABLET | Freq: Every morning | ORAL | Status: DC
  Administered 2013-08-26 – 2013-09-01 (×7): 10 mg via ORAL
  Filled 2013-08-26 (×7): qty 1

## 2013-08-26 MED ORDER — MONTELUKAST SODIUM 10 MG PO TABS
10.0000 mg | ORAL_TABLET | Freq: Every day | ORAL | Status: DC
  Administered 2013-08-26 – 2013-08-31 (×6): 10 mg via ORAL
  Filled 2013-08-26 (×7): qty 1

## 2013-08-26 MED ORDER — HYDROMORPHONE HCL PF 1 MG/ML IJ SOLN
0.5000 mg | INTRAMUSCULAR | Status: DC | PRN
  Administered 2013-08-26 (×4): 1 mg via INTRAVENOUS
  Administered 2013-08-27: 2 mg via INTRAVENOUS
  Administered 2013-08-27 (×2): 1 mg via INTRAVENOUS
  Filled 2013-08-26 (×2): qty 1
  Filled 2013-08-26: qty 2
  Filled 2013-08-26 (×4): qty 1

## 2013-08-26 MED ORDER — LORATADINE 10 MG PO TABS
10.0000 mg | ORAL_TABLET | Freq: Every day | ORAL | Status: DC
  Administered 2013-08-26 – 2013-09-01 (×7): 10 mg via ORAL
  Filled 2013-08-26 (×7): qty 1

## 2013-08-26 MED ORDER — FAT EMULSION 20 % IV EMUL
250.0000 mL | INTRAVENOUS | Status: AC
  Administered 2013-08-26: 250 mL via INTRAVENOUS
  Filled 2013-08-26: qty 250

## 2013-08-26 MED ORDER — ALPRAZOLAM 0.5 MG PO TABS
0.5000 mg | ORAL_TABLET | Freq: Two times a day (BID) | ORAL | Status: DC | PRN
  Administered 2013-08-26 – 2013-08-30 (×5): 0.5 mg via ORAL
  Filled 2013-08-26 (×5): qty 1

## 2013-08-26 MED ORDER — SACCHAROMYCES BOULARDII 250 MG PO CAPS
250.0000 mg | ORAL_CAPSULE | Freq: Two times a day (BID) | ORAL | Status: DC
  Filled 2013-08-26 (×14): qty 1

## 2013-08-26 MED ORDER — ADULT MULTIVITAMIN W/MINERALS CH
1.0000 | ORAL_TABLET | Freq: Every day | ORAL | Status: DC
  Administered 2013-08-26: 1 via ORAL
  Filled 2013-08-26 (×2): qty 1

## 2013-08-26 MED ORDER — MAGIC MOUTHWASH
15.0000 mL | Freq: Four times a day (QID) | ORAL | Status: DC | PRN
  Filled 2013-08-26: qty 15

## 2013-08-26 MED ORDER — HYDROMORPHONE HCL PF 1 MG/ML IJ SOLN: 1.0000 mg | INTRAMUSCULAR | Status: DC | PRN

## 2013-08-26 NOTE — Progress Notes (Addendum)
PARENTERAL NUTRITION CONSULT NOTE - FOLLOW UP  Pharmacy Consult for TNA Indication: Bowel obstruction  Allergies  Allergen Reactions  . Neurontin [Gabapentin] Other (See Comments)    Other reaction(s): Mental Status Changes (intolerance) hallucinations Sleep walking  . Amoxicillin Hives  . Penicillins Hives  . Latex Rash   Patient Measurements: 08/08/13: Height: 5\' 6"  (167.6 cm) Weight: 164 lb 14.5 oz (74.8 kg) IBW/kg (Calculated) : 59.3 Adjusted Body Weight: 64kg Usual Weight: 75kg  Vital Signs: Temp: 98.1 F (36.7 C) (10/23 0604) Temp src: Oral (10/23 0604) BP: 121/79 mmHg (10/23 0604) Pulse Rate: 98 (10/23 0604) Intake/Output from previous day: 10/22 0701 - 10/23 0700 In: 1320 [I.V.:240; TPN:1080] Out: 1852 [Urine:1851; Stool:1]  Labs: No results found for this basename: WBC, HGB, HCT, PLT, APTT, INR,  in the last 72 hours  Recent Labs  08/24/13 0535 08/25/13 0530 08/26/13 0540  NA 135 134* 136  K 4.1 3.6 3.5  CL 99 99 101  CO2 25 24 24   GLUCOSE 127* 132* 97  BUN 15 15 17   CREATININE 0.81 0.85 0.79  CALCIUM 9.4 9.6 9.9  MG 1.9 1.9 1.8  PHOS 4.6 4.5 4.7*  PROT  --   --  7.5  ALBUMIN  --   --  2.9*  AST  --   --  18  ALT  --   --  32  ALKPHOS  --   --  210*  BILITOT  --   --  0.2*   Estimated Creatinine Clearance: 92.8 ml/min (by C-G formula based on Cr of 0.79).   Recent Labs  08/25/13 1616 08/25/13 2348 08/26/13 0759  GLUCAP 119* 122* 130*   Insulin Requirements in the past 24 hours:  4 units - on  SSI moderate scale q8h   IVF: D5 1/2NS w/ 20KCl at 10-20 ml/hr  Current Nutrition:  FLD started 10/23 0800 Clinimix 5/15 at 80 ml/hr Lipids 20% at 10 ml/hr  Nutritional Goals:   Re-estimated per RD on 10/16:  1750-1950 kcal/day, 90-105g protein/day, and 1.7-1.9 L fluid/day  Clinimix 5/15 at 80 mL/hr + Lipids 20% 54mL/hr delivers 96g protein/d, 1843 kcal per day  TPN Access: Double lumen PICC TPN day#: 15  Assessment: 42 y/o F s/p  enterolysis of adhesions 07/29/13, presented to ED 08/08/13 with small bowel obstruction. TNA initiated 10/9  Expl laparotomy 10/13:  partial SB resection due to inflammatory mass of intestinal mesentery, lysis of adhesions  Surgery d/c'd NG and advancing to full liquids today.    Labs:  Electrolytes: Phosphorus back up today at 4.7, electrolytes have been removed from TNA since 10/20.  Corrected Ca remains elevated 10.78.  [Ca][Phos] product = 50.6.  Other lytes ok.  LFTs: alk phos slightly elevated (< 3x ULN); other LFTs remain below ULN  TG WNL.  Prealbumin: 13.3 (10/10), 18.7 (10/13), 13.1 (10/21)   CBG's - WNL with SSI coverage    Plan: At 1800 today:  Continue Clinimix 5/15 plain (no electrolytes) at goal rate of 80 mL/hr.  Continue 20% fat emulsion at 12ml/hr daily.  Will remove standard multivitamins and trace elements from TNA since patient has new order for oral MVI with minerals.    BMET, Phos in AM.  F/u how pt tolerates diet and plan to wean TNA.   Clance Boll, PharmD, BCPS Pager: [phone] 08/26/2013 8:20 AM

## 2013-08-26 NOTE — Progress Notes (Signed)
Jane English [id number] 01-22-1971  CARE TEAM:  PCP: Syliva Overman, MD  Outpatient Care Team: Patient Care Team: Kerri Perches, MD as PCP - General Ardeth Sportsman, MD as Consulting Physician (General Surgery) Karn Cassis, MD as Consulting Physician (Neurosurgery) Beverley Fiedler, MD as Consulting Physician (Gastroenterology)  Inpatient Treatment Team: Treatment Team: Attending Provider: Ardeth Sportsman, MD; Registered Nurse: Tristan Schroeder, RN; Consulting Physician: Palliative Doristine Johns; Dietitian: Lavena Bullion, RD; Registered Nurse: Lattie Haw, RN; Technician: Oren Bracket [place], NT; Technician: Valetta Close, [place]; Technician: Melvenia Beam, NT; Registered Nurse: Dina Rich, RN; Technician: Vella Raring, NT; Registered Nurse: Georgeanne Nim Debrew, RN   Subjective: Pain less at incision  Slept poorly Tol liquids w NGT clamped  Objective:  Vital signs:  Filed Vitals:   08/26/13 0030 08/26/13 0147 08/26/13 0430 08/26/13 0604  BP:  102/68  121/79  Pulse:  112  98  Temp:  99.2 F (37.3 C)  98.1 F (36.7 C)  TempSrc:  Oral  Oral  Resp: 20 20 20 20   Height:      Weight:      SpO2: 99% 99% 99% 100%    Last BM Date: 08/26/13  Intake/Output   Yesterday:  10/22 0701 - 10/23 0700 In: 1320 [I.V.:240; TPN:1080] Out: 4098 [Urine:1851; Stool:1] This shift:     Bowel function:  Flatus: YES  BM: YES x1.  Drain: Thin bilious  Physical Exam:  General: Pt awake/alert/oriented x4 in mild distress.  Alert Eyes: PERRL, normal EOM.  Sclera clear.  No icterus Neuro: CN II-XII intact w/o focal sensory/motor deficits. Lymph: No head/neck/groin lymphadenopathy Psych:  No delerium/psychosis/paranoia.  Smiling more. HENT: Normocephalic, Mucus membranes moist.  No thrush.  NGT in place Neck: Supple, No tracheal deviation Chest: No chest wall pain w good excursion CV:  Pulses intact.  Regular rhythm MS: Normal AROM mjr  joints.  No obvious deformity Abdomen: Soft.  Obese.  Nondistended.  Sensitive at incision.  Incisions clean.   Min erythema at corner 1 cm - improved & no fluctuance.  No incarcerated hernias.   Ext:  SCDs BLE.  No mjr edema.  No cyanosis Skin: No petechiae / purpura   Problem List:   Principal Problem:   SBO (small bowel obstruction) Active Problems:   Chronic pain syndrome   Anxiety state, unspecified   DEPRESSION, CHRONIC   Chronic constipation   PTSD (post-traumatic stress disorder)   Chronic migraine without aura   Palliative care encounter   Abdominal pain, unspecified site   Diabetes  SURGERY 07/29/2013 POST-OPERATIVE DIAGNOSIS: recurrent abdominal pain,extensive adhesions   PROCEDURE: Procedure(s):  diagnostic laporoscopy  LYSIS OF ADHESIONS  serosal repair  SURGEON: Surgeon(s):  Ardeth Sportsman, MD  Kandis Cocking, MD - Asst      Assessment  Jane English  42 y.o. female  10 Days Post-Op    Partial small bowel obstruction in patient with chronic pain on chronic narcotics s/p significant adhesiolysis & repeat exlap with SB resection - IMPROVING  Plan:  -Continue IV fluid hydration.  TNA  -NGT d/c'd by me.  Try full liquids  -d/c PCA.  Ice/heat/tylenol RTC.  Dilaudid/Robaxin PRN.  Refused fentanyl patch  -PRN nausea control.   -sitz baths.  Analpram for anal discomfort now  -no new c/o urinary burning s/p pyridium x 2d - follow  -VTE prophylaxis- SCDs, etc  -mobilize as tolerated to help recovery.  The more she can walk, the better.  -?  control depression.  Difficult with ileus/SBO.  D/w Palliative Care team in past.  She needs to get back to her chronic pain specialist at [facility] at some point, but this is not a great time to radically change her regimen if possible.     -I updated the patient's status to the patient & her husband in the room.  Recommendations were made.  Questions were answered.  They are more hopeful & expressed understanding  & appreciation.    Ardeth Sportsman, M.D., F.A.C.S. Gastrointestinal and Minimally Invasive Surgery [facility] [address] [place], [place] [address] [phone] Main / Paging   08/26/2013   Results:   Labs: Results for orders placed during the hospital encounter of 08/08/13 (from the past 48 hour(s))  GLUCOSE, CAPILLARY     Status: Abnormal   Collection Time    08/24/13  5:02 PM      Result Value Range   Glucose-Capillary 120 (*) 70 - 99 mg/dL  GLUCOSE, CAPILLARY     Status: Abnormal   Collection Time    08/24/13 11:49 PM      Result Value Range   Glucose-Capillary 132 (*) 70 - 99 mg/dL  BASIC METABOLIC PANEL     Status: Abnormal   Collection Time    08/25/13  5:30 AM      Result Value Range   Sodium 134 (*) 135 - 145 mEq/L   Potassium 3.6  3.5 - 5.1 mEq/L   Chloride 99  96 - 112 mEq/L   CO2 24  19 - 32 mEq/L   Glucose, Bld 132 (*) 70 - 99 mg/dL   BUN 15  6 - 23 mg/dL   Creatinine, Ser 3.08  0.50 - 1.10 mg/dL   Calcium 9.6  8.4 - 65.7 mg/dL   GFR calc non Af Amer 82 (*) >90 mL/min   GFR calc Af Amer >90  >90 mL/min   Comment: (NOTE)     The eGFR has been calculated using the CKD EPI equation.     This calculation has not been validated in all clinical situations.     eGFR's persistently <90 mL/min signify possible Chronic Kidney     Disease.  MAGNESIUM     Status: None   Collection Time    08/25/13  5:30 AM      Result Value Range   Magnesium 1.9  1.5 - 2.5 mg/dL  PHOSPHORUS     Status: None   Collection Time    08/25/13  5:30 AM      Result Value Range   Phosphorus 4.5  2.3 - 4.6 mg/dL  GLUCOSE, CAPILLARY     Status: Abnormal   Collection Time    08/25/13  7:23 AM      Result Value Range   Glucose-Capillary 132 (*) 70 - 99 mg/dL   Comment 1 Documented in Chart     Comment 2 Notify RN    GLUCOSE, CAPILLARY     Status: Abnormal   Collection Time    08/25/13  4:16 PM      Result Value Range   Glucose-Capillary  119 (*) 70 - 99 mg/dL  GLUCOSE, CAPILLARY     Status: Abnormal   Collection Time    08/25/13 11:48 PM      Result Value Range   Glucose-Capillary 122 (*) 70 - 99 mg/dL  COMPREHENSIVE METABOLIC PANEL     Status: Abnormal   Collection Time    08/26/13  5:40 AM      Result Value Range   Sodium 136  135 - 145 mEq/L   Potassium 3.5  3.5 - 5.1 mEq/L   Chloride 101  96 - 112 mEq/L   CO2 24  19 - 32 mEq/L   Glucose, Bld 97  70 - 99 mg/dL   BUN 17  6 - 23 mg/dL   Creatinine, Ser 1.61  0.50 - 1.10 mg/dL   Calcium 9.9  8.4 - 09.6 mg/dL   Total Protein 7.5  6.0 - 8.3 g/dL   Albumin 2.9 (*) 3.5 - 5.2 g/dL   AST 18  0 - 37 U/L   ALT 32  0 - 35 U/L   Alkaline Phosphatase 210 (*) 39 - 117 U/L   Total Bilirubin 0.2 (*) 0.3 - 1.2 mg/dL   GFR calc non Af Amer >90  >90 mL/min   GFR calc Af Amer >90  >90 mL/min   Comment: (NOTE)     The eGFR has been calculated using the CKD EPI equation.     This calculation has not been validated in all clinical situations.     eGFR's persistently <90 mL/min signify possible Chronic Kidney     Disease.  MAGNESIUM     Status: None   Collection Time    08/26/13  5:40 AM      Result Value Range   Magnesium 1.8  1.5 - 2.5 mg/dL  PHOSPHORUS     Status: Abnormal   Collection Time    08/26/13  5:40 AM      Result Value Range   Phosphorus 4.7 (*) 2.3 - 4.6 mg/dL  GLUCOSE, CAPILLARY     Status: Abnormal   Collection Time    08/26/13  7:59 AM      Result Value Range   Glucose-Capillary 130 (*) 70 - 99 mg/dL    Imaging / Studies: No results found.  Medications / Allergies: per chart  Antibiotics: Anti-infectives   Start     Dose/Rate Route Frequency Ordered Stop   08/17/13 0600  clindamycin (CLEOCIN) IVPB 600 mg  Status:  Discontinued    Comments:  Pharmacy may adjust dosing strength, interval, or rate of medication as needed for optimal therapy for the patient Send with patient on call to the OR.  Anesthesia to complete antibiotic administration <27min  prior to incision per [facility].   600 mg 100 mL/hr over 30 Minutes Intravenous On call to O.R. 08/16/13 1720 08/17/13 0740   08/17/13 0600  gentamicin (GARAMYCIN) IVPB 100 mg  Status:  Discontinued    Comments:  Pharmacy may adjust dosing strength, schedule, rate of infusion, etc as needed to optimize therapy Send with patient on call to the OR.  Anesthesia to complete antibiotic administration <13min prior to incision per [facility].   100 mg 200 mL/hr over 30 Minutes Intravenous On call to O.R. 08/16/13 1720 08/16/13 1732   08/16/13 1732  gentamicin (GARAMYCIN) 370 mg in dextrose 5 % 100 mL IVPB     5 mg/kg  74.8 kg 218.5 mL/hr over 30 Minutes Intravenous 30 min pre-op 08/16/13 1731 08/16/13 1836

## 2013-08-26 NOTE — Progress Notes (Signed)
  This NP meet at bedsdie with patietn and her husbnad.  Patient reports that she has downloaded relaxation sounds and is finding technique is helpful in her overall sense of well being.  Continued conversation regarding the  importance of self advocacy and self responsibility in her physical and mental health and well being. Discussion regarding  -role of nutrition and exercise  -development of coping mechanisms;exercise, music, journaling, prayer  -healthy relationship  -support form family and friends and church  -professional psych intervention and maintenance   15 min guided imagery, relaxation breathing and hand massage.  Patient reports appreciation.  Pmt will continue to support holistically  Total time spend with the patient was 25 min time in 1400- time out 1425    Lorinda Creed NP  Palliative Medicine Team Team Phone # [phone] Pager [phone]

## 2013-08-27 ENCOUNTER — Ambulatory Visit (HOSPITAL_COMMUNITY): Payer: Self-pay | Admitting: Psychiatry

## 2013-08-27 LAB — BASIC METABOLIC PANEL
CO2: 22 mEq/L (ref 19–32)
Calcium: 9.7 mg/dL (ref 8.4–10.5)
Chloride: 101 mEq/L (ref 96–112)
GFR calc Af Amer: >90 mL/min (ref >90)
Glucose, Bld: 117 mg/dL — ABNORMAL HIGH (ref 70–99)
Potassium: 3 mEq/L — ABNORMAL LOW (ref 3.5–5.1)
Sodium: 136 mEq/L (ref 135–145)

## 2013-08-27 LAB — PHOSPHORUS: Phosphorus: 5 mg/dL — ABNORMAL HIGH (ref 2.3–4.6)

## 2013-08-27 LAB — GLUCOSE, CAPILLARY: Glucose-Capillary: 130 mg/dL — ABNORMAL HIGH (ref 70–99)

## 2013-08-27 MED ORDER — DIPHENHYDRAMINE HCL 50 MG/ML IJ SOLN: 12.5000 mg | Freq: Four times a day (QID) | INTRAMUSCULAR | Status: DC | PRN

## 2013-08-27 MED ORDER — POTASSIUM CHLORIDE 10 MEQ/100ML IV SOLN
10.0000 meq | INTRAVENOUS | Status: AC
  Administered 2013-08-27 (×4): 10 meq via INTRAVENOUS
  Filled 2013-08-27 (×4): qty 100

## 2013-08-27 MED ORDER — CARBAMAZEPINE ER 100 MG PO CP12
100.0000 mg | ORAL_CAPSULE | Freq: Every day | ORAL | Status: DC
  Administered 2013-08-27: 100 mg via ORAL
  Filled 2013-08-27 (×3): qty 1

## 2013-08-27 MED ORDER — HYDROMORPHONE HCL PF 1 MG/ML IJ SOLN: 0.5000 mg | INTRAMUSCULAR | Status: DC | PRN

## 2013-08-27 MED ORDER — FAT EMULSION 20 % IV EMUL
250.0000 mL | INTRAVENOUS | Status: AC
  Administered 2013-08-27: 250 mL via INTRAVENOUS
  Filled 2013-08-27: qty 250

## 2013-08-27 MED ORDER — OXYCODONE HCL 5 MG PO TABS: 5.0000 mg | ORAL_TABLET | Freq: Four times a day (QID) | ORAL | Status: DC | PRN

## 2013-08-27 MED ORDER — ACETAMINOPHEN 500 MG PO TABS
1000.0000 mg | ORAL_TABLET | Freq: Three times a day (TID) | ORAL | Status: DC
  Administered 2013-08-27 – 2013-09-01 (×16): 1000 mg via ORAL
  Filled 2013-08-27 (×24): qty 2

## 2013-08-27 MED ORDER — PSYLLIUM 95 % PO PACK
1.0000 | PACK | Freq: Two times a day (BID) | ORAL | Status: DC
  Administered 2013-08-27 – 2013-08-31 (×5): 1 via ORAL
  Filled 2013-08-27 (×12): qty 1

## 2013-08-27 MED ORDER — ZOLPIDEM TARTRATE 5 MG PO TABS: 5.0000 mg | ORAL_TABLET | Freq: Every evening | ORAL | Status: DC | PRN

## 2013-08-27 MED ORDER — OXYCODONE HCL 5 MG PO TABS
5.0000 mg | ORAL_TABLET | ORAL | Status: DC | PRN
Start: 2013-08-27 — End: 2013-09-01
  Administered 2013-08-27 – 2013-08-28 (×4): 10 mg via ORAL
  Administered 2013-08-28: 5 mg via ORAL
  Administered 2013-08-28 – 2013-09-01 (×11): 10 mg via ORAL
  Filled 2013-08-27 (×17): qty 2

## 2013-08-27 MED ORDER — TRACE MINERALS CR-CU-F-FE-I-MN-MO-SE-ZN IV SOLN
INTRAVENOUS | Status: AC
  Administered 2013-08-27: 17:00:00 via INTRAVENOUS
  Filled 2013-08-27: qty 2000

## 2013-08-27 MED ORDER — DIPHENHYDRAMINE HCL 25 MG PO CAPS: 25.0000 mg | ORAL_CAPSULE | Freq: Four times a day (QID) | ORAL | Status: DC | PRN

## 2013-08-27 MED ORDER — CARBAMAZEPINE ER 100 MG PO CP12: 100.0000 mg | ORAL_CAPSULE | Freq: Every day | ORAL | Status: DC

## 2013-08-27 NOTE — Progress Notes (Signed)
PARENTERAL NUTRITION CONSULT NOTE - FOLLOW UP  Pharmacy Consult for TNA Indication: Bowel obstruction  Allergies  Allergen Reactions  . Neurontin [Gabapentin] Other (See Comments)    Other reaction(s): Mental Status Changes (intolerance) hallucinations Sleep walking  . Amoxicillin Hives  . Penicillins Hives  . Latex Rash   Patient Measurements:  Aug 08, 2013: Height: 5\' 6"  (167.6 cm) Weight: 164 lb 14.5 oz (74.8 kg) IBW/kg (Calculated) : 59.3 Adjusted body weight: 64 kg   Vital Signs: Temp: 98.1 F (36.7 C) (10/24 1610) Temp src: Oral (10/24 9604) BP: 95/59 mmHg (10/24 5409) Pulse Rate: 81 (10/24 0613) Intake/Output from previous day: 10/23 0701 - 10/24 0700 In: 3120 [P.O.:240; I.V.:720; TPN:2160] Out: 550 [Urine:550]  Labs: No results found for this basename: WBC, HGB, HCT, PLT, APTT, INR,  in the last 72 hours  Recent Labs  08/25/13 0530 08/26/13 0540 08/27/13 0535  NA 134* 136 136  K 3.6 3.5 3.0*  CL 99 101 101  CO2 24 24 22   GLUCOSE 132* 97 117*  BUN 15 17 17   CREATININE 0.85 0.79 0.84  CALCIUM 9.6 9.9 9.7  MG 1.9 1.8  --   PHOS 4.5 4.7* 5.0*  PROT  --  7.5  --   ALBUMIN  --  2.9*  --   AST  --  18  --   ALT  --  32  --   ALKPHOS  --  210*  --   BILITOT  --  0.2*  --    Estimated Creatinine Clearance: 88.4 ml/min (by C-G formula based on Cr of 0.84).   Recent Labs  08/26/13 1547 08/26/13 2339 08/27/13 0741  GLUCAP 128* 126* 130*   Insulin Requirements in the past 24 hours:  6 units - on  SSI moderate scale q8h   IVF: D5 1/2NS w/ 20KCl at 10-20 ml/hr  Current Nutrition:  FLD started 10/23 0800 Clinimix 5/15 at 80 ml/hr Lipids 20% at 10 ml/hr  Nutritional Goals:   Re-estimated per RD on 10/16:  1750-1950 kcal/day, 90-105g protein/day, and 1.7-1.9 L fluid/day  Clinimix 5/15 at 80 mL/hr + Lipids 20% 78mL/hr delivers 96g protein/d, 1843 kcal per day  TPN Access: Double lumen PICC TPN day#: 16  Assessment: 42 y/o F s/p enterolysis  of adhesions 07/29/13, presented to ED 08/08/13 with small bowel obstruction. TNA initiated 10/9  Expl laparotomy 10/13:  partial SB resection due to inflammatory mass of intestinal mesentery, lysis of adhesions  Surgery d/c'd NG and advanced to full liquids 10/23.    Patient reports her bowels are working but has abdominal pain when she tries to take full liquid diet.  She declined the oral multivitamin.  Labs:  Electrolytes: Phosphorus remains elevated (5.0) despite removal from TNA since 10/20.  K low (3.0)  LFTs: alk phos slightly elevated on 10/23 (< 3x ULN); other LFTs remain below ULN  TG WNL on 10/20  Prealbumin: 13.3 (10/10), 18.7 (10/13), 13.1 (10/20)   CBG's - WNL with SSI coverage    Plan: At 1800 today:  Continue Clinimix 5/15 plain (no electrolytes) at goal rate of 80 mL/hr.  Resume multivitamins and minerals in TNA until patient can tolerate orally.  KCL 10 mEq IV q1h x 4  Continue 20% fat emulsion at 36ml/hr daily.  BMET, Phos in AM.    F/U tolerance of diet.  Elie Goody, PharmD, BCPS Pager: [phone] 08/27/2013  11:21 AM

## 2013-08-27 NOTE — Progress Notes (Signed)
Calorie Count Ordered 10/24 5 PM thru 10/27 5 PM  Calorie count envelope has been hung on the patient's door. Please document percent consumed for each item on the patient's meal tray ticket and keep in envelope. Also document percent of any supplement or snack pt consumes and keep documentation in envelope for RD to review.   Ian Malkin RD, LDN Pager: [phone] After Hours Pager: [phone]

## 2013-08-27 NOTE — Progress Notes (Signed)
Jane English [id number] [birth year]  CARE TEAM:  PCP: Syliva Overman, MD  Outpatient Care Team: Patient Care Team: Kerri Perches, MD as PCP - General Ardeth Sportsman, MD as Consulting Physician (General Surgery) Karn Cassis, MD as Consulting Physician (Neurosurgery) Beverley Fiedler, MD as Consulting Physician (Gastroenterology)  Inpatient Treatment Team: Treatment Team: Attending Provider: Ardeth Sportsman, MD; Registered Nurse: Tristan Schroeder, RN; Consulting Physician: Palliative Doristine Johns; Dietitian: Lavena Bullion, RD; Registered Nurse: Lattie Haw, RN; Technician: Oren Bracket [place], NT; Technician: Valetta Close, [place]; Technician: Melvenia Beam, NT; Technician: Vella Raring, NT; Registered Nurse: Jonathon Bellows, RN; Registered Nurse: Talmage Nap, RN; Technician: Burnard Bunting, [place]   Subjective: C/o sweating.  Worried she may get sicker crampy abd pain Adv diet Walking well  Objective:  Vital signs:  Filed Vitals:   08/27/13 0140 08/27/13 0613 08/27/13 0916 08/27/13 1042  BP: 96/61 95/59  102/65  Pulse: 92 81  110  Temp: 98.1 F (36.7 C) 98.1 F (36.7 C)  98 F (36.7 C)  TempSrc: Oral Oral  Oral  Resp: 18 18  16   Height:      Weight:      SpO2: 99% 100% 93% 99%    Last BM Date: 08/26/13  Intake/Output   Yesterday:  10/23 0701 - 10/24 0700 In: 3120 [P.O.:240; I.V.:720; TPN:2160] Out: 550 [Urine:550] This shift:  Total I/O In: -  Out: 500 [Urine:500]  Bowel function:  Flatus: YES  BM: YES x3.  Drain: Thin bilious  Physical Exam:  General: Pt resting but  awakens/alert/oriented x4 in mild distress.  Alert Eyes: PERRL, normal EOM.  Sclera clear.  No icterus Neuro: CN II-XII intact w/o focal sensory/motor deficits. Lymph: No head/neck/groin lymphadenopathy Psych:  No delerium/psychosis/paranoia.  Anxious/tearful HENT: Normocephalic, Mucus membranes moist.  No thrush.  NGT in place Neck:  Supple, No tracheal deviation Chest: No chest wall pain w good excursion CV:  Pulses intact.  Regular rhythm MS: Normal AROM mjr joints.  No obvious deformity Abdomen: Soft.  Obese.  Nondistended.  Sensitive at incision.  Incisions clean.   Normal healing ridge at incision - very sensitive to light touch.  No incarcerated hernias.   Ext:  SCDs BLE.  No mjr edema.  No cyanosis Skin: No petechiae / purpura   Problem List:   Principal Problem:   SBO (small bowel obstruction) Active Problems:   Chronic pain syndrome   Anxiety state, unspecified   DEPRESSION, CHRONIC   Chronic constipation   PTSD (post-traumatic stress disorder)   Chronic migraine without aura   Palliative care encounter   Abdominal pain, unspecified site   Diabetes  SURGERY 07/29/2013 POST-OPERATIVE DIAGNOSIS: recurrent abdominal pain,extensive adhesions   PROCEDURE: Procedure(s):  diagnostic laporoscopy  LYSIS OF ADHESIONS  serosal repair  SURGEON: Surgeon(s):  Ardeth Sportsman, MD  Kandis Cocking, MD - Asst      Assessment  [name]  42 y.o. female  11 Days Post-Op    Partial small bowel obstruction in patient with chronic pain on chronic narcotics s/p significant adhesiolysis & repeat exlap with SB resection - IMPROVING  Plan:  -Continue IV fluid hydration.  TNA.  Try to wean  -NGT d/c'd by me.   Adv diet gradually.  ?cal counts to see what she is actually eating  -Pain controla chellenge. Ice/heat/tylenol RTC.  Dilaudid/Robaxin PRN.  Refused fentanyl patch  -PRN nausea control.   -sitz baths.  Analpram for anal  discomfort now  -no new c/o urinary burning s/p pyridium x 2d - follow  -VTE prophylaxis- SCDs, etc  -mobilize as tolerated to help recovery.  The more she can walk, the better.  -?control depression.  Difficult with ileus/SBO.  D/w Palliative Care team in past.  She needs to get back to her chronic pain specialist at [facility] at some point, but this is not a great time to  radically change her regimen if possible.     -I updated the patient's status to the patient & her husband in the room.  Recommendations were made.  Questions were answered.  They are more hopeful & expressed understanding & appreciation.    Ardeth Sportsman, M.D., F.A.C.S. Gastrointestinal and Minimally Invasive Surgery [facility] [address] [place], [place] [address] [phone] Main / Paging   08/27/2013   Results:   Labs: Results for orders placed during the hospital encounter of 08/08/13 (from the past 48 hour(s))  GLUCOSE, CAPILLARY     Status: Abnormal   Collection Time    08/25/13  4:16 PM      Result Value Range   Glucose-Capillary 119 (*) 70 - 99 mg/dL  GLUCOSE, CAPILLARY     Status: Abnormal   Collection Time    08/25/13 11:48 PM      Result Value Range   Glucose-Capillary 122 (*) 70 - 99 mg/dL  COMPREHENSIVE METABOLIC PANEL     Status: Abnormal   Collection Time    08/26/13  5:40 AM      Result Value Range   Sodium 136  135 - 145 mEq/L   Potassium 3.5  3.5 - 5.1 mEq/L   Chloride 101  96 - 112 mEq/L   CO2 24  19 - 32 mEq/L   Glucose, Bld 97  70 - 99 mg/dL   BUN 17  6 - 23 mg/dL   Creatinine, Ser 6.21  0.50 - 1.10 mg/dL   Calcium 9.9  8.4 - 30.8 mg/dL   Total Protein 7.5  6.0 - 8.3 g/dL   Albumin 2.9 (*) 3.5 - 5.2 g/dL   AST 18  0 - 37 U/L   ALT 32  0 - 35 U/L   Alkaline Phosphatase 210 (*) 39 - 117 U/L   Total Bilirubin 0.2 (*) 0.3 - 1.2 mg/dL   GFR calc non Af Amer >90  >90 mL/min   GFR calc Af Amer >90  >90 mL/min   Comment: (NOTE)     The eGFR has been calculated using the CKD EPI equation.     This calculation has not been validated in all clinical situations.     eGFR's persistently <90 mL/min signify possible Chronic Kidney     Disease.  MAGNESIUM     Status: None   Collection Time    08/26/13  5:40 AM      Result Value Range   Magnesium 1.8  1.5 - 2.5 mg/dL  PHOSPHORUS     Status: Abnormal    Collection Time    08/26/13  5:40 AM      Result Value Range   Phosphorus 4.7 (*) 2.3 - 4.6 mg/dL  GLUCOSE, CAPILLARY     Status: Abnormal   Collection Time    08/26/13  7:59 AM      Result Value Range   Glucose-Capillary 130 (*) 70 - 99 mg/dL  GLUCOSE, CAPILLARY     Status: Abnormal   Collection Time  08/26/13  3:47 PM      Result Value Range   Glucose-Capillary 128 (*) 70 - 99 mg/dL  GLUCOSE, CAPILLARY     Status: Abnormal   Collection Time    08/26/13 11:39 PM      Result Value Range   Glucose-Capillary 126 (*) 70 - 99 mg/dL  BASIC METABOLIC PANEL     Status: Abnormal   Collection Time    08/27/13  5:35 AM      Result Value Range   Sodium 136  135 - 145 mEq/L   Potassium 3.0 (*) 3.5 - 5.1 mEq/L   Chloride 101  96 - 112 mEq/L   CO2 22  19 - 32 mEq/L   Glucose, Bld 117 (*) 70 - 99 mg/dL   BUN 17  6 - 23 mg/dL   Creatinine, Ser 1.61  0.50 - 1.10 mg/dL   Calcium 9.7  8.4 - 09.6 mg/dL   GFR calc non Af Amer 83 (*) >90 mL/min   GFR calc Af Amer >90  >90 mL/min   Comment: (NOTE)     The eGFR has been calculated using the CKD EPI equation.     This calculation has not been validated in all clinical situations.     eGFR's persistently <90 mL/min signify possible Chronic Kidney     Disease.  PHOSPHORUS     Status: Abnormal   Collection Time    08/27/13  5:35 AM      Result Value Range   Phosphorus 5.0 (*) 2.3 - 4.6 mg/dL  GLUCOSE, CAPILLARY     Status: Abnormal   Collection Time    08/27/13  7:41 AM      Result Value Range   Glucose-Capillary 130 (*) 70 - 99 mg/dL    Imaging / Studies: No results found.  Medications / Allergies: per chart  Antibiotics: Anti-infectives   Start     Dose/Rate Route Frequency Ordered Stop   08/17/13 0600  clindamycin (CLEOCIN) IVPB 600 mg  Status:  Discontinued    Comments:  Pharmacy may adjust dosing strength, interval, or rate of medication as needed for optimal therapy for the patient Send with patient on call to the OR.   Anesthesia to complete antibiotic administration <15min prior to incision per [facility].   600 mg 100 mL/hr over 30 Minutes Intravenous On call to O.R. 08/16/13 1720 08/17/13 0740   08/17/13 0600  gentamicin (GARAMYCIN) IVPB 100 mg  Status:  Discontinued    Comments:  Pharmacy may adjust dosing strength, schedule, rate of infusion, etc as needed to optimize therapy Send with patient on call to the OR.  Anesthesia to complete antibiotic administration <22min prior to incision per [facility].   100 mg 200 mL/hr over 30 Minutes Intravenous On call to O.R. 08/16/13 1720 08/16/13 1732   08/16/13 1732  gentamicin (GARAMYCIN) 370 mg in dextrose 5 % 100 mL IVPB     5 mg/kg  74.8 kg 218.5 mL/hr over 30 Minutes Intravenous 30 min pre-op 08/16/13 1731 08/16/13 1836

## 2013-08-28 DIAGNOSIS — E46 Unspecified protein-calorie malnutrition: Secondary | ICD-10-CM

## 2013-08-28 DIAGNOSIS — E119 Type 2 diabetes mellitus without complications: Secondary | ICD-10-CM

## 2013-08-28 LAB — CBC
HCT: 25.7 % — ABNORMAL LOW (ref 36.0–46.0)
Hemoglobin: 8.4 g/dL — ABNORMAL LOW (ref 12.0–15.0)
MCH: 30.3 pg (ref 26.0–34.0)
MCV: 92.8 fL (ref 78.0–100.0)
RBC: 2.77 MIL/uL — ABNORMAL LOW (ref 3.87–5.11)

## 2013-08-28 LAB — GLUCOSE, CAPILLARY
Glucose-Capillary: 109 mg/dL — ABNORMAL HIGH (ref 70–99)
Glucose-Capillary: 116 mg/dL — ABNORMAL HIGH (ref 70–99)
Glucose-Capillary: 116 mg/dL — ABNORMAL HIGH (ref 70–99)
Glucose-Capillary: 92 mg/dL (ref 70–99)

## 2013-08-28 LAB — BASIC METABOLIC PANEL
BUN: 19 mg/dL (ref 6–23)
CO2: 22 mEq/L (ref 19–32)
Chloride: 105 mEq/L (ref 96–112)
Creatinine, Ser: 0.89 mg/dL (ref 0.50–1.10)

## 2013-08-28 MED ORDER — POTASSIUM CHLORIDE 10 MEQ/100ML IV SOLN
10.0000 meq | INTRAVENOUS | Status: AC
  Administered 2013-08-28 (×4): 10 meq via INTRAVENOUS
  Filled 2013-08-28 (×4): qty 100

## 2013-08-28 MED ORDER — TRACE MINERALS CR-CU-F-FE-I-MN-MO-SE-ZN IV SOLN
INTRAVENOUS | Status: AC
  Administered 2013-08-28: 17:00:00 via INTRAVENOUS
  Filled 2013-08-28: qty 1000

## 2013-08-28 MED ORDER — FAT EMULSION 20 % IV EMUL
250.0000 mL | INTRAVENOUS | Status: AC
  Administered 2013-08-28: 250 mL via INTRAVENOUS
  Filled 2013-08-28: qty 250

## 2013-08-28 MED ORDER — CARBAMAZEPINE ER 100 MG PO TB12
100.0000 mg | ORAL_TABLET | Freq: Every day | ORAL | Status: DC
  Administered 2013-08-28 – 2013-09-01 (×5): 100 mg via ORAL
  Filled 2013-08-28 (×5): qty 1

## 2013-08-28 NOTE — Progress Notes (Signed)
12 Days Post-Op  Subjective: Increased crampy abdominal pain after eating some [place] food yesterday.  Objective: Vital signs in last 24 hours: Temp:  [98.2 F (36.8 C)-98.6 F (37 C)] 98.6 F (37 C) (10/25 0524) Pulse Rate:  [105-119] 105 (10/25 0524) Resp:  [16-18] 16 (10/25 0524) BP: (96-121)/(56-73) 100/56 mmHg (10/25 1129) SpO2:  [98 %-100 %] 98 % (10/25 0524) Last BM Date: 08/26/13  Intake/Output from previous day: 10/24 0701 - 10/25 0700 In: 120 [P.O.:120] Out: 1300 [Urine:1300] Intake/Output this shift: Total I/O In: -  Out: 400 [Urine:400]  PE: General- In NAD Abdomen-soft, active bowel sounds, wound clean and intact  Lab Results:   Recent Labs  08/28/13 0710  WBC 13.9*  HGB 8.4*  HCT 25.7*  PLT 417*   BMET  Recent Labs  08/27/13 0535 08/28/13 0710  NA 136 139  K 3.0* 3.1*  CL 101 105  CO2 22 22  GLUCOSE 117* 114*  BUN 17 19  CREATININE 0.84 0.89  CALCIUM 9.7 9.8   PT/INR No results found for this basename: LABPROT, INR,  in the last 72 hours Comprehensive Metabolic Panel:    Component Value Date/Time   NA 139 08/28/2013 0710   K 3.1* 08/28/2013 0710   CL 105 08/28/2013 0710   CO2 22 08/28/2013 0710   BUN 19 08/28/2013 0710   CREATININE 0.89 08/28/2013 0710   CREATININE 0.86 06/07/2013 1114   GLUCOSE 114* 08/28/2013 0710   CALCIUM 9.8 08/28/2013 0710   AST 18 08/26/2013 0540   ALT 32 08/26/2013 0540   ALKPHOS 210* 08/26/2013 0540   BILITOT 0.2* 08/26/2013 0540   PROT 7.5 08/26/2013 0540   ALBUMIN 2.9* 08/26/2013 0540     Studies/Results: No results found.  Anti-infectives: Anti-infectives   Start     Dose/Rate Route Frequency Ordered Stop   08/17/13 0600  clindamycin (CLEOCIN) IVPB 600 mg  Status:  Discontinued    Comments:  Pharmacy may adjust dosing strength, interval, or rate of medication as needed for optimal therapy for the patient Send with patient on call to the OR.  Anesthesia to complete antibiotic administration  <51min prior to incision per [facility].   600 mg 100 mL/hr over 30 Minutes Intravenous On call to O.R. 08/16/13 1720 08/17/13 0740   08/17/13 0600  gentamicin (GARAMYCIN) IVPB 100 mg  Status:  Discontinued    Comments:  Pharmacy may adjust dosing strength, schedule, rate of infusion, etc as needed to optimize therapy Send with patient on call to the OR.  Anesthesia to complete antibiotic administration <2min prior to incision per [facility].   100 mg 200 mL/hr over 30 Minutes Intravenous On call to O.R. 08/16/13 1720 08/16/13 1732   08/16/13 1732  gentamicin (GARAMYCIN) 370 mg in dextrose 5 % 100 mL IVPB     5 mg/kg  74.8 kg 218.5 mL/hr over 30 Minutes Intravenous 30 min pre-op 08/16/13 1731 08/16/13 1836      Assessment Principal Problem:   SBO (small bowel obstruction) s/p exploratory laparotomy, lysis of adhesions, small bowel resection on 08/15/13-tried some solid food yesterday and it lead to increased crampy pain; had 3 BMs yesterday:WBC 13,900 today, 16,200 on 10/20; she is afebrile. Active Problems:   Anxiety state, unspecified   DEPRESSION, CHRONIC   Chronic constipation   PTSD (post-traumatic stress disorder)   Chronic migraine without aura   Chronic pain syndrome   Palliative care encounter   Abdominal pain, unspecified site   Diabetes-well controlled   PC  malnutrition on TPN    LOS: 20 days   Plan: Continue TPN.  Keep on full liquids.  Repeat WBC tomorrow.   Avin Gibbons J 08/28/2013

## 2013-08-28 NOTE — Progress Notes (Signed)
PARENTERAL NUTRITION CONSULT NOTE - FOLLOW UP  Pharmacy Consult for TNA Indication: Bowel obstruction  Allergies  Allergen Reactions  . Neurontin [Gabapentin] Other (See Comments)    Other reaction(s): Mental Status Changes (intolerance) hallucinations Sleep walking  . Amoxicillin Hives  . Penicillins Hives  . Latex Rash   Patient Measurements: Aug 08, 2013: Height: 5\' 6"  (167.6 cm) Weight: 164 lb 14.5 oz (74.8 kg) IBW/kg (Calculated) : 59.3 Adjusted body weight: 64 kg  Vital Signs: Temp: 98.6 F (37 C) (10/25 0524) Temp src: Oral (10/25 0524) BP: 121/73 mmHg (10/25 0524) Pulse Rate: 105 (10/25 0524) Intake/Output from previous day: 10/24 0701 - 10/25 0700 In: 120 [P.O.:120] Out: 1300 [Urine:1300]  Labs:  Recent Labs  08/28/13 0710  WBC 13.9*  HGB 8.4*  HCT 25.7*  PLT 417*    Recent Labs  08/26/13 0540 08/27/13 0535 08/28/13 0710  NA 136 136 139  K 3.5 3.0* 3.1*  CL 101 101 105  CO2 24 22 22   GLUCOSE 97 117* 114*  BUN 17 17 19   CREATININE 0.79 0.84 0.89  CALCIUM 9.9 9.7 9.8  MG 1.8  --  1.6  PHOS 4.7* 5.0* 4.8*  PROT 7.5  --   --   ALBUMIN 2.9*  --   --   AST 18  --   --   ALT 32  --   --   ALKPHOS 210*  --   --   BILITOT 0.2*  --   --    Estimated Creatinine Clearance: 83.4 ml/min (by C-G formula based on Cr of 0.89).   Recent Labs  08/27/13 1557 08/27/13 2352 08/28/13 0822  GLUCAP 106* 95 92   Insulin Requirements in the past 24 hours:  8 units for CBG 130-106; on  SSI moderate scale q8h   IVF: D5 1/2NS w/ 20KCl at 10-20 ml/hr  Current Nutrition:  FLD started 10/23 0800 Clinimix 5/15 at 80 ml/hr Lipids 20% at 10 ml/hr  Nutritional Goals:   Re-estimated per RD on 10/16:  1750-1950 kcal/day, 90-105g protein/day, and 1.7-1.9 L fluid/day  Clinimix 5/15 at 80 mL/hr + Lipids 20% 54mL/hr delivers 96g protein/d, 1843 kcal per day  TPN Access: Double lumen PICC TPN day#: 17  Assessment: 42 y/o F s/p enterolysis of adhesions  07/29/13, presented to ED 08/08/13 with small bowel obstruction. TNA initiated 10/9  Expl laparotomy 10/13:  partial SB resection due to inflammatory mass of intestinal mesentery, lysis of adhesions  Surgery d/c'd NG and advanced to full liquids 10/23.    Patient reports her bowels are working but has abdominal pain when she tries to take full liquid diet.  She declined the oral multivitamin.  Complaint of abd pain this morning, less flatus, will wean TNA but not able to d/c TNA today  Labs:  Electrolytes: Phosphorus remains elevated (4.8) despite removal from TNA since 10/20.  K low (3.1)  LFTs: alk phos slightly elevated on 10/23 (< 3x ULN); other LFTs remain below ULN  TG WNL on 10/20  Prealbumin: 13.3 (10/10), 18.7 (10/13), 13.1 (10/20)   CBG's - 130-106 with 8 units SSI coverage    Plan: At 1800 today:  Continue Clinimix 5/15 plain (no electrolytes) and reduce rate to 40 mL/hr.  Resume multivitamins and minerals in TNA until patient can tolerate orally.  KCL 10 mEq IV q1h x 4  Continue 20% fat emulsion at 79ml/hr daily.  BMET, Phos in AM.    F/U tolerance of diet  Otho Bellows PharmD  Pager [phone] 08/28/2013, 9:08 AM

## 2013-08-29 LAB — CBC
Hemoglobin: 8.2 g/dL — ABNORMAL LOW (ref 12.0–15.0)
MCV: 92.9 fL (ref 78.0–100.0)
Platelets: 417 10*3/uL — ABNORMAL HIGH (ref 150–400)
RBC: 2.68 MIL/uL — ABNORMAL LOW (ref 3.87–5.11)
WBC: 11.5 10*3/uL — ABNORMAL HIGH (ref 4.0–10.5)

## 2013-08-29 LAB — BASIC METABOLIC PANEL
BUN: 17 mg/dL (ref 6–23)
CO2: 21 mEq/L (ref 19–32)
GFR calc Af Amer: 88 mL/min — ABNORMAL LOW (ref >90)
GFR calc non Af Amer: 76 mL/min — ABNORMAL LOW (ref >90)
Potassium: 3.2 mEq/L — ABNORMAL LOW (ref 3.5–5.1)
Sodium: 134 mEq/L — ABNORMAL LOW (ref 135–145)

## 2013-08-29 LAB — GLUCOSE, CAPILLARY
Glucose-Capillary: 115 mg/dL — ABNORMAL HIGH (ref 70–99)
Glucose-Capillary: 119 mg/dL — ABNORMAL HIGH (ref 70–99)

## 2013-08-29 LAB — PHOSPHORUS: Phosphorus: 5.2 mg/dL — ABNORMAL HIGH (ref 2.3–4.6)

## 2013-08-29 MED ORDER — BISACODYL 10 MG RE SUPP
10.0000 mg | Freq: Once | RECTAL | Status: AC
  Administered 2013-08-29: 10 mg via RECTAL
  Filled 2013-08-29: qty 1

## 2013-08-29 MED ORDER — FAT EMULSION 20 % IV EMUL
250.0000 mL | INTRAVENOUS | Status: AC
  Administered 2013-08-29: 250 mL via INTRAVENOUS
  Filled 2013-08-29: qty 250

## 2013-08-29 MED ORDER — POTASSIUM CHLORIDE 20 MEQ/15ML (10%) PO LIQD
20.0000 meq | ORAL | Status: AC
  Administered 2013-08-29 (×2): 20 meq via ORAL
  Filled 2013-08-29 (×4): qty 15

## 2013-08-29 MED ORDER — TRACE MINERALS CR-CU-F-FE-I-MN-MO-SE-ZN IV SOLN
INTRAVENOUS | Status: AC
  Administered 2013-08-29: 17:00:00 via INTRAVENOUS
  Filled 2013-08-29: qty 1000

## 2013-08-29 NOTE — Progress Notes (Signed)
PARENTERAL NUTRITION CONSULT NOTE - FOLLOW UP  Pharmacy Consult for TNA Indication: Bowel obstruction  Allergies  Allergen Reactions  . Neurontin [Gabapentin] Other (See Comments)    Other reaction(s): Mental Status Changes (intolerance) hallucinations Sleep walking  . Amoxicillin Hives  . Penicillins Hives  . Latex Rash   Patient Measurements: Aug 08, 2013: Height: 5\' 6"  (167.6 cm) Weight: 164 lb 14.5 oz (74.8 kg) IBW/kg (Calculated) : 59.3 Adjusted body weight: 64 kg  Vital Signs: Temp: 98.2 F (36.8 C) (10/26 0615) Temp src: Oral (10/26 0615) BP: 82/60 mmHg (10/26 1105) Pulse Rate: 81 (10/26 0615) Intake/Output from previous day: 10/25 0701 - 10/26 0700 In: 5254.7 [P.O.:240; I.V.:720; ZOX:0960.4] Out: 2050 [Urine:2050]  Labs:  Recent Labs  08/28/13 0710 08/29/13 0443  WBC 13.9* 11.5*  HGB 8.4* 8.2*  HCT 25.7* 24.9*  PLT 417* 417*    Recent Labs  08/27/13 0535 08/28/13 0710 08/29/13 0443  NA 136 139 134*  K 3.0* 3.1* 3.2*  CL 101 105 103  CO2 22 22 21   GLUCOSE 117* 114* 103*  BUN 17 19 17   CREATININE 0.84 0.89 0.91  CALCIUM 9.7 9.8 9.8  MG  --  1.6  --   PHOS 5.0* 4.8* 5.2*   Estimated Creatinine Clearance: 81.6 ml/min (by C-G formula based on Cr of 0.91).   Recent Labs  08/28/13 1628 08/28/13 2357 08/29/13 0756  GLUCAP 116* 109* 114*   Insulin Requirements in the past 24 hours:  no units for CBG 109-116 on  SSI moderate scale q8h   IVF: D5 1/2NS w/ 20KCl at 10-20 ml/hr  Current Nutrition:  FLD started 10/23 0800 Clinimix 5/15 at 40 ml/hr Lipids 20% at 10 ml/hr  Nutritional Goals:   Re-estimated per RD on 10/16:  1750-1950 kcal/day, 90-105g protein/day, and 1.7-1.9 L fluid/day  Clinimix 5/15 at 80 mL/hr + Lipids 20% 32mL/hr delivers 96g protein/d, 1843 kcal per day  TPN Access: Double lumen PICC TPN day#: 72  Assessment: 42 y/o F s/p enterolysis of adhesions 07/29/13, presented to ED 08/08/13 with small bowel obstruction. TNA  initiated 10/9  Expl laparotomy 10/13:  partial SB resection due to inflammatory mass of intestinal mesentery, lysis of adhesions  Surgery d/c'd NG and advanced to full liquids 10/23.    Patient reports her bowels are working but has abdominal pain when she tries to take full liquid diet.  She declined the oral multivitamin.  Less complaint of abd pain, tolerating some full liquids, continue TNA today and finish calorie count  Labs:  Electrolytes: Phosphorus remains elevated (5.2) despite removal from TNA since 10/20.  K low (3.2) despite K runs daily.  LFTs: alk phos slightly elevated on 10/23 (< 3x ULN); other LFTs remain below ULN  TG WNL on 10/20  Prealbumin: 13.3 (10/10), 18.7 (10/13), 13.1 (10/20)   Plan: At 1800 today:  Continue Clinimix 5/15 plain (no electrolytes) at reduced rate of 40 mL/hr.  Continue multivitamins and minerals in TNA until patient can tolerate orally.  KCL 20 mEq po q4hr x 2 doses  Continue 20% fat emulsion at 85ml/hr daily.  Full TNA labs in am  F/U tolerance of diet, hope to wean and d/c TNA tomorrow  Otho Bellows PharmD Pager [phone] 08/29/2013, 12:14 PM

## 2013-08-29 NOTE — Progress Notes (Signed)
13 Days Post-Op  Subjective: In better spirits today.  No BM.  Tolerating some full liquids.  No severe crampy pain.  Walking.  Objective: Vital signs in last 24 hours: Temp:  [98.2 F (36.8 C)-99.7 F (37.6 C)] 98.2 F (36.8 C) (10/26 0615) Pulse Rate:  [81-108] 81 (10/26 0615) Resp:  [16] 16 (10/26 0615) BP: (96-106)/(53-64) 103/64 mmHg (10/26 0615) SpO2:  [98 %-100 %] 98 % (10/26 0814) Last BM Date: 08/26/13  Intake/Output from previous day: 10/25 0701 - 10/26 0700 In: 5254.7 [P.O.:240; I.V.:720; TPN:4294.7] Out: 2050 [Urine:2050] Intake/Output this shift:    PE: General- In NAD Abdomen-soft, hypoactive bowel sounds, wound clean and intact  Lab Results:   Recent Labs  08/28/13 0710 08/29/13 0443  WBC 13.9* 11.5*  HGB 8.4* 8.2*  HCT 25.7* 24.9*  PLT 417* 417*   BMET  Recent Labs  08/28/13 0710 08/29/13 0443  NA 139 134*  K 3.1* 3.2*  CL 105 103  CO2 22 21  GLUCOSE 114* 103*  BUN 19 17  CREATININE 0.89 0.91  CALCIUM 9.8 9.8   PT/INR No results found for this basename: LABPROT, INR,  in the last 72 hours Comprehensive Metabolic Panel:    Component Value Date/Time   NA 134* 08/29/2013 0443   K 3.2* 08/29/2013 0443   CL 103 08/29/2013 0443   CO2 21 08/29/2013 0443   BUN 17 08/29/2013 0443   CREATININE 0.91 08/29/2013 0443   CREATININE 0.86 06/07/2013 1114   GLUCOSE 103* 08/29/2013 0443   CALCIUM 9.8 08/29/2013 0443   AST 18 08/26/2013 0540   ALT 32 08/26/2013 0540   ALKPHOS 210* 08/26/2013 0540   BILITOT 0.2* 08/26/2013 0540   PROT 7.5 08/26/2013 0540   ALBUMIN 2.9* 08/26/2013 0540     Studies/Results: No results found.  Anti-infectives: Anti-infectives   Start     Dose/Rate Route Frequency Ordered Stop   08/17/13 0600  clindamycin (CLEOCIN) IVPB 600 mg  Status:  Discontinued    Comments:  Pharmacy may adjust dosing strength, interval, or rate of medication as needed for optimal therapy for the patient Send with patient on call to the  OR.  Anesthesia to complete antibiotic administration <81min prior to incision per [facility].   600 mg 100 mL/hr over 30 Minutes Intravenous On call to O.R. 08/16/13 1720 08/17/13 0740   08/17/13 0600  gentamicin (GARAMYCIN) IVPB 100 mg  Status:  Discontinued    Comments:  Pharmacy may adjust dosing strength, schedule, rate of infusion, etc as needed to optimize therapy Send with patient on call to the OR.  Anesthesia to complete antibiotic administration <41min prior to incision per [facility].   100 mg 200 mL/hr over 30 Minutes Intravenous On call to O.R. 08/16/13 1720 08/16/13 1732   08/16/13 1732  gentamicin (GARAMYCIN) 370 mg in dextrose 5 % 100 mL IVPB     5 mg/kg  74.8 kg 218.5 mL/hr over 30 Minutes Intravenous 30 min pre-op 08/16/13 1731 08/16/13 1836      Assessment Principal Problem:   SBO (small bowel obstruction) s/p exploratory laparotomy, lysis of adhesions, small bowel resection on 08/15/13-still with some ileus; WBC down Active Problems:   Anxiety state, unspecified   DEPRESSION, CHRONIC   Chronic constipation   PTSD (post-traumatic stress disorder)   Chronic migraine without aura   Chronic pain syndrome   Palliative care encounter   Abdominal pain, unspecified site   Diabetes-well controlled   PC malnutrition on TPN    LOS:  21 days   Plan: Continue TPN. Advance to bland diet.  Dulcolax supp. Luqman Perrelli J 08/29/2013

## 2013-08-30 ENCOUNTER — Ambulatory Visit (HOSPITAL_COMMUNITY): Payer: Self-pay | Admitting: Psychiatry

## 2013-08-30 DIAGNOSIS — R109 Unspecified abdominal pain: Secondary | ICD-10-CM | POA: Diagnosis not present

## 2013-08-30 DIAGNOSIS — F411 Generalized anxiety disorder: Secondary | ICD-10-CM | POA: Diagnosis not present

## 2013-08-30 DIAGNOSIS — Z515 Encounter for palliative care: Secondary | ICD-10-CM | POA: Diagnosis not present

## 2013-08-30 LAB — DIFFERENTIAL
Basophils Absolute: 0.1 10*3/uL (ref 0.0–0.1)
Eosinophils Relative: 3 % (ref 0–5)
Lymphocytes Relative: 20 % (ref 12–46)
Lymphs Abs: 2 10*3/uL (ref 0.7–4.0)
Monocytes Absolute: 1 10*3/uL (ref 0.1–1.0)
Monocytes Relative: 10 % (ref 3–12)
Neutrophils Relative %: 66 % (ref 43–77)

## 2013-08-30 LAB — CBC
HCT: 24.9 % — ABNORMAL LOW (ref 36.0–46.0)
MCH: 30.3 pg (ref 26.0–34.0)
MCHC: 32.9 g/dL (ref 30.0–36.0)
MCV: 91.9 fL (ref 78.0–100.0)
Platelets: 378 10*3/uL (ref 150–400)
RBC: 2.71 MIL/uL — ABNORMAL LOW (ref 3.87–5.11)
WBC: 10.2 10*3/uL (ref 4.0–10.5)

## 2013-08-30 LAB — COMPREHENSIVE METABOLIC PANEL
ALT: 21 U/L (ref 0–35)
AST: 13 U/L (ref 0–37)
Calcium: 9.9 mg/dL (ref 8.4–10.5)
Creatinine, Ser: 0.91 mg/dL (ref 0.50–1.10)
GFR calc Af Amer: 88 mL/min — ABNORMAL LOW (ref >90)
Potassium: 3.3 mEq/L — ABNORMAL LOW (ref 3.5–5.1)
Sodium: 137 mEq/L (ref 135–145)
Total Bilirubin: 0.2 mg/dL — ABNORMAL LOW (ref 0.3–1.2)
Total Protein: 7.5 g/dL (ref 6.0–8.3)

## 2013-08-30 LAB — TRIGLYCERIDES: Triglycerides: 104 mg/dL (ref <150)

## 2013-08-30 LAB — GLUCOSE, CAPILLARY
Glucose-Capillary: 110 mg/dL — ABNORMAL HIGH (ref 70–99)
Glucose-Capillary: 114 mg/dL — ABNORMAL HIGH (ref 70–99)

## 2013-08-30 LAB — PHOSPHORUS: Phosphorus: 5.2 mg/dL — ABNORMAL HIGH (ref 2.3–4.6)

## 2013-08-30 LAB — PREALBUMIN: Prealbumin: 19.9 mg/dL (ref 17.0–34.0)

## 2013-08-30 MED ORDER — FAT EMULSION 20 % IV EMUL
250.0000 mL | INTRAVENOUS | Status: DC
  Administered 2013-08-30: 250 mL via INTRAVENOUS
  Filled 2013-08-30: qty 250

## 2013-08-30 MED ORDER — TRACE MINERALS CR-CU-F-FE-I-MN-MO-SE-ZN IV SOLN
INTRAVENOUS | Status: DC
  Administered 2013-08-30: 18:00:00 via INTRAVENOUS
  Filled 2013-08-30: qty 1000

## 2013-08-30 MED ORDER — HYDROMORPHONE HCL PF 1 MG/ML IJ SOLN: 0.5000 mg | INTRAMUSCULAR | Status: DC | PRN

## 2013-08-30 MED ORDER — POTASSIUM CHLORIDE 20 MEQ/15ML (10%) PO LIQD
20.0000 meq | ORAL | Status: AC
  Administered 2013-08-30 (×2): 20 meq via ORAL
  Filled 2013-08-30 (×2): qty 15

## 2013-08-30 NOTE — Progress Notes (Signed)
Progress Note from the Palliative Medicine Team at [facility]  Subjective:  Patient is alert and oriented, sitting up in chair  -higher energy level today, sharing thoughts and feeling and frustration with long hospital stay  -"I'm ready to go home"     Objective: Allergies  Allergen Reactions  . Neurontin [Gabapentin] Other (See Comments)    Other reaction(s): Mental Status Changes (intolerance) hallucinations Sleep walking  . Amoxicillin Hives  . Penicillins Hives  . Latex Rash   Scheduled Meds: . acetaminophen  1,000 mg Oral TID WC & HS  . amLODipine  10 mg Oral q morning - 10a  . budesonide-formoterol  2 puff Inhalation BID  . carbamazepine  100 mg Oral Daily  . feeding supplement (ENSURE COMPLETE)  237 mL Oral TID WC  . fluticasone  2 spray Each Nare Daily  . heparin  5,000 Units Subcutaneous Q8H  . insulin aspart  0-15 Units Subcutaneous Q8H  . lip balm  1 application Topical BID  . loratadine  10 mg Oral Daily  . montelukast  10 mg Oral QHS  . olopatadine  1 drop Both Eyes BID  . psyllium  1 packet Oral BID  . saccharomyces boulardii  250 mg Oral BID   Continuous Infusions: . dextrose 5 % and 0.45 % NaCl with KCl 20 mEq/L 20 mL/hr at 08/27/13 0800  . TPN (CLINIMIX) Adult without lytes 40 mL/hr at 08/29/13 1722   And  . fat emulsion 250 mL (08/29/13 1722)  . TPN (CLINIMIX) Adult without lytes     And  . fat emulsion     PRN Meds:.albuterol, ALPRAZolam, alum & mag hydroxide-simeth, bisacodyl, diclofenac sodium, diphenhydrAMINE, diphenhydrAMINE, HYDROmorphone (DILAUDID) injection, influenza vac split quadrivalent PF, ipratropium, lidocaine, magic mouthwash, methocarbamol (ROBAXIN) IV, nitroGLYCERIN, oxyCODONE, sodium chloride, zolpidem  BP 100/58  Pulse 98  Temp(Src) 98.4 F (36.9 C) (Oral)  Resp 14  Ht 5\' 6"  (1.676 m)  Wt 74.8 kg (164 lb 14.5 oz)  BMI 26.63 kg/m2  SpO2 99%  LMP 08/23/2013   PPS:40 %    Intake/Output Summary (Last 24 hours) at  08/30/13 1508 Last data filed at 08/30/13 1000  Gross per 24 hour  Intake   2760 ml  Output   1150 ml  Net   1610 ml        Physical Exam:  General: NAD, continues weakness HEENT:  Mm, no exudate Chest:   CTA (encouraged deep breathing) CVS: RRR Abdomen:soft +BS Ext: without edeam Neuro:alert and oriented X3 fully engaged in conversation  Labs: CBC    Component Value Date/Time   WBC 10.2 08/30/2013 0503   RBC 2.71* 08/30/2013 0503   HGB 8.2* 08/30/2013 0503   HCT 24.9* 08/30/2013 0503   PLT 378 08/30/2013 0503   MCV 91.9 08/30/2013 0503   MCH 30.3 08/30/2013 0503   MCHC 32.9 08/30/2013 0503   RDW 13.1 08/30/2013 0503   LYMPHSABS 2.0 08/30/2013 0503   MONOABS 1.0 08/30/2013 0503   EOSABS 0.3 08/30/2013 0503   BASOSABS 0.1 08/30/2013 0503    BMET    Component Value Date/Time   NA 137 08/30/2013 0503   K 3.3* 08/30/2013 0503   CL 104 08/30/2013 0503   CO2 21 08/30/2013 0503   GLUCOSE 122* 08/30/2013 0503   BUN 16 08/30/2013 0503   CREATININE 0.91 08/30/2013 0503   CREATININE 0.86 06/07/2013 1114   CALCIUM 9.9 08/30/2013 0503   GFRNONAA 76* 08/30/2013 0503   GFRAA 88* 08/30/2013 0503  CMP     Component Value Date/Time   NA 137 08/30/2013 0503   K 3.3* 08/30/2013 0503   CL 104 08/30/2013 0503   CO2 21 08/30/2013 0503   GLUCOSE 122* 08/30/2013 0503   BUN 16 08/30/2013 0503   CREATININE 0.91 08/30/2013 0503   CREATININE 0.86 06/07/2013 1114   CALCIUM 9.9 08/30/2013 0503   PROT 7.5 08/30/2013 0503   ALBUMIN 2.9* 08/30/2013 0503   AST 13 08/30/2013 0503   ALT 21 08/30/2013 0503   ALKPHOS 149* 08/30/2013 0503   BILITOT 0.2* 08/30/2013 0503   GFRNONAA 76* 08/30/2013 0503   GFRAA 88* 08/30/2013 0503      Assessment and Plan: 1. Code Status:  Full code 2. Symptom Control:  Continued conversation regarding importance of self responsibility and non pharmacological approaches to pain and stress management.  She tells me that the relaxation intervention  "is helpful"  She is now only prescribed Oxycodone IR 5-10 mg every 4 hrs prn and only utilizing it three times daily.  We discussed expectation for need to decrease even more over the next days.  She understands her opportunity to start clean of some of the polypharmacy issues she come into the hospital with.  Patient should need nothing stronger than current medication for pain management on discharge.  3. Psycho/Social:  Encouraged to make appointment wiih psycho-therapist for anticipated discharge.  4. Spiritual  Strong community church support 5. Disposition:  Hopeful home in a day or two     Time In Time Out Total Time Spent with Patient Total Overall Time  1130 1205 35 min 35 min    Greater than 50%  of this time was spent counseling and coordinating care related to the above assessment and plan.  Lorinda Creed NP  Palliative Medicine Team Team Phone # [phone] Pager [phone]   1

## 2013-08-30 NOTE — Progress Notes (Signed)
Calorie Count Note  Intervention:  - Discussed results of calorie count so far with pt, and encouraged increased protein and nutritional supplement intake - TPN per pharmacy - Will continue to analyze calorie count for the next 24 hours  72 hour calorie count ordered.  Diet: Heart healthy Supplements: Ensure Complete TID  10/24 Dinner: 255 calories, 19g protein Supplements: 175 calories, 7g protein  10/25 Breakfast: 250 calories, 3g protein Dinner: 560 calories, 5g protein  10/26 Breakfast: 150 calories, 8g protein Dinner: 945 calories, 6g protein Supplements: 350 calories, 13g protein  Total average intake: 895 kcal (51% of minimum estimated needs)  20g protein (22% of minimum estimated needs)  Nutrition Dx: Inadequate oral intake related to inability to eat as evidenced by NPO - no longer appropriate, diet advanced. Now inadequate oral intake related to abdominal pain as evidenced by pt consuming <50% of estimated nutritional needs  Goal: TPN to meet >90% of estimated nutritional needs - not met. Currently getting TPN with Clinimix E 5/15 @ 40 ml/hr and lipids @ 10 ml/hr. Provides 1162 kcal, and 48 grams protein per day. Meets 66% minimum estimated energy needs and 53% minimum estimated protein needs.  New goal: Pt to consume >50% of meals/supplements.  Levon Hedger MS, RD, LDN [phone] Pager [phone] After Hours Pager

## 2013-08-30 NOTE — Progress Notes (Signed)
PARENTERAL NUTRITION CONSULT NOTE - FOLLOW UP  Pharmacy Consult for TNA Indication: Bowel obstruction  Allergies  Allergen Reactions  . Neurontin [Gabapentin] Other (See Comments)    Other reaction(s): Mental Status Changes (intolerance) hallucinations Sleep walking  . Amoxicillin Hives  . Penicillins Hives  . Latex Rash   Patient Measurements: Aug 08, 2013: Height: 5\' 6"  (167.6 cm) Weight: 164 lb 14.5 oz (74.8 kg) IBW/kg (Calculated) : 59.3 Adjusted body weight: 64 kg  Vital Signs: Temp: 98.2 F (36.8 C) (10/27 0600) Temp src: Oral (10/27 0600) BP: 104/65 mmHg (10/27 0600) Pulse Rate: 99 (10/27 0600) Intake/Output from previous day: 10/26 0701 - 10/27 0700 In: 2520 [I.V.:720; TPN:1800] Out: 900 [Urine:900]  Labs:  Recent Labs  08/28/13 0710 08/29/13 0443 08/30/13 0503  WBC 13.9* 11.5* 10.2  HGB 8.4* 8.2* 8.2*  HCT 25.7* 24.9* 24.9*  PLT 417* 417* 378    Recent Labs  08/28/13 0710 08/29/13 0443 08/30/13 0503  NA 139 134* 137  K 3.1* 3.2* 3.3*  CL 105 103 104  CO2 22 21 21   GLUCOSE 114* 103* 122*  BUN 19 17 16   CREATININE 0.89 0.91 0.91  CALCIUM 9.8 9.8 9.9  MG 1.6  --  1.6  PHOS 4.8* 5.2* 5.2*  PROT  --   --  7.5  ALBUMIN  --   --  2.9*  AST  --   --  13  ALT  --   --  21  ALKPHOS  --   --  149*  BILITOT  --   --  0.2*  TRIG  --   --  104  Corrected Calcium=10.8 Estimated Creatinine Clearance: 81.6 ml/min (by C-G formula based on Cr of 0.91).   Recent Labs  08/29/13 1606 08/29/13 2335 08/30/13 0749  GLUCAP 115* 119* 110*   Insulin Requirements in the past 24 hours:  None needed (CBGs < 120)  IVF: D5 1/2NS w/ 20KCl at 10-20 ml/hr  Current Nutrition:  Heart healthy low sodium diet  - calorie count in progress Ensure complete 1 can TID - charted as taking 2 cans yesterday Clinimix 5/15 at 40 ml/hr Lipids 20% at 10 ml/hr  Nutritional Goals:   Re-estimated per RD on 10/16:  1750-1950 kcal/day, 90-105g protein/day, and 1.7-1.9 L  fluid/day  Clinimix 5/15 at 80 mL/hr + Lipids 20% 6mL/hr delivers 96g protein/d, 1843 kcal per day  TPN Access: Double lumen PICC TPN day#: 46  Assessment: 42 y/o F s/p enterolysis of adhesions 07/29/13, presented to ED 08/08/13 with small bowel obstruction. TNA initiated 10/9  Expl laparotomy 10/13:  partial SB resection due to inflammatory mass of intestinal mesentery, lysis of adhesions  Now taking some solid food - calorie count in progress  TNA rate was weaned to 40 mL/hr yesterday.  Labs:  Electrolytes: Phosphorus remains elevated (5.2) despite removal from TNA since 10/20.  K improved but still low (3.3) after KCl PO x 2 doses yesterday  LFTs: alk phos remains slightly elevated(< 2x ULN); other LFTs remain below ULN  TG WNL   Prealbumin: 13.3 (10/10), 18.7 (10/13), 13.1 (10/20) ; today's value pending  CBGs well controlled - not requiring sliding scale coverage    Plan: At 1800 today: 1. Continue Clinimix 5/15 plain (no electrolytes) at 40 mL/hr. 2. KCL PO q4h x 2 today.  Reduce 20% fat emulsion to 5 mL/hr daily.  BMET, Phos in AM.    F/U calorie count  Elie Goody, PharmD, BCPS Pager: [phone] 08/30/2013  8:46 AM

## 2013-08-30 NOTE — Progress Notes (Signed)
Jane English [id number] April 25, 1971  CARE TEAM:  PCP: Syliva Overman, MD  Outpatient Care Team: Patient Care Team: Kerri Perches, MD as PCP - General Ardeth Sportsman, MD as Consulting Physician (General Surgery) Karn Cassis, MD as Consulting Physician (Neurosurgery) Beverley Fiedler, MD as Consulting Physician (Gastroenterology)  Inpatient Treatment Team: Treatment Team: Attending Provider: Ardeth Sportsman, MD; Registered Nurse: Tristan Schroeder, RN; Consulting Physician: Palliative Doristine Johns; Dietitian: Lavena Bullion, RD; Registered Nurse: Lattie Haw, RN; Technician: Oren Bracket [place], NT; Technician: Valetta Close, [place]; Technician: Melvenia Beam, NT; Technician: Vella Raring, NT; Registered Nurse: Jonathon Bellows, RN; Registered Nurse: Talmage Nap, RN   Subjective: Moving bowels more regular.  Worried that is not enough crampy abd pain Adv diet Walking well  Objective:  Vital signs:  Filed Vitals:   08/29/13 1434 08/29/13 1944 08/29/13 2100 08/30/13 0600  BP: 98/59  107/54 104/65  Pulse: 102 80 109 99  Temp: 98.1 F (36.7 C)  99.7 F (37.6 C) 98.2 F (36.8 C)  TempSrc: Oral  Oral Oral  Resp: 16 14 18 14   Height:      Weight:      SpO2: 97% 97% 98% 99%    Last BM Date: 08/29/13  Intake/Output   Yesterday:  10/26 0701 - 10/27 0700 In: 2520 [I.V.:720; TPN:1800] Out: 900 [Urine:900] This shift:     Bowel function:  Flatus: YES  BM: YES.  Drain: NGT out  Physical Exam:  General: Pt resting but  awakens/alert/oriented x4 in mild distress.  Alert Eyes: PERRL, normal EOM.  Sclera clear.  No icterus Neuro: CN II-XII intact w/o focal sensory/motor deficits. Lymph: No head/neck/groin lymphadenopathy Psych:  No delerium/psychosis/paranoia.  Depressed HENT: Normocephalic, Mucus membranes moist.  No thrush.  NGT in place Neck: Supple, No tracheal deviation Chest: No chest wall pain w good excursion CV:   Pulses intact.  Regular rhythm MS: Normal AROM mjr joints.  No obvious deformity Abdomen: Soft.  Obese.  Nondistended.  Less sensitive at incision.  Incisions clean.   Normal healing ridge at incision - sensitive to light touch.  No incarcerated hernias.  Sore LLQ Ext:  SCDs BLE.  No mjr edema.  No cyanosis Skin: No petechiae / purpura   Problem List:   Principal Problem:   SBO (small bowel obstruction) Active Problems:   Chronic pain syndrome   Anxiety state, unspecified   DEPRESSION, CHRONIC   Chronic constipation   PTSD (post-traumatic stress disorder)   Chronic migraine without aura   Palliative care encounter   Abdominal pain, unspecified site   Diabetes  SURGERY 07/29/2013 POST-OPERATIVE DIAGNOSIS: recurrent abdominal pain,extensive adhesions   PROCEDURE: Procedure(s):  diagnostic laporoscopy  LYSIS OF ADHESIONS  serosal repair  SURGEON: Surgeon(s):  Ardeth Sportsman, MD  Kandis Cocking, MD - Asst      Assessment  [name]  42 y.o. female  14 Days Post-Op    Partial small bowel obstruction in patient with chronic pain on chronic narcotics s/p significant adhesiolysis & repeat exlap with SB resection - IMPROVING  Plan:  -Wean off TNA.    -Solid diet.  ?cal counts to see what she is actually eating  -Pain control a challenge. Ice/heat/tylenol RTC.  Oxy IR PO per pall care  -PRN nausea control.   -sitz baths.  Analpram for anal discomfort now  -no new c/o urinary burning s/p pyridium x 2d - follow  -VTE prophylaxis- SCDs, etc  -mobilize  as tolerated to help recovery.  The more she can walk, the better.  -?control depression.  Difficult with ileus/SBO.  D/w Palliative Care team in past.  She needs to get back to her chronic pain specialist at [facility] at some point, but this is not a great time to radically change her regimen if possible.    D/C patient from hospital when patient meets criteria (anticipate in 1-2 day(s)):  Tolerating oral intake  well Ambulating in walkways Adequate pain control without IV medications Urinating  Having flatus    -I updated the patient's status to the patient & her husband in the room.  Recommendations were made.  Questions were answered.  They are more hopeful & expressed understanding & appreciation.    Ardeth Sportsman, M.D., F.A.C.S. Gastrointestinal and Minimally Invasive Surgery [facility] [address] [place], [place] [address] [phone] Main / Paging   08/30/2013   Results:   Labs: Results for orders placed during the hospital encounter of 08/08/13 (from the past 48 hour(s))  GLUCOSE, CAPILLARY     Status: None   Collection Time    08/28/13  8:22 AM      Result Value Range   Glucose-Capillary 92  70 - 99 mg/dL  GLUCOSE, CAPILLARY     Status: Abnormal   Collection Time    08/28/13 12:08 PM      Result Value Range   Glucose-Capillary 116 (*) 70 - 99 mg/dL  GLUCOSE, CAPILLARY     Status: Abnormal   Collection Time    08/28/13  4:28 PM      Result Value Range   Glucose-Capillary 116 (*) 70 - 99 mg/dL  GLUCOSE, CAPILLARY     Status: Abnormal   Collection Time    08/28/13 11:57 PM      Result Value Range   Glucose-Capillary 109 (*) 70 - 99 mg/dL  BASIC METABOLIC PANEL     Status: Abnormal   Collection Time    08/29/13  4:43 AM      Result Value Range   Sodium 134 (*) 135 - 145 mEq/L   Potassium 3.2 (*) 3.5 - 5.1 mEq/L   Chloride 103  96 - 112 mEq/L   CO2 21  19 - 32 mEq/L   Glucose, Bld 103 (*) 70 - 99 mg/dL   BUN 17  6 - 23 mg/dL   Creatinine, Ser 6.21  0.50 - 1.10 mg/dL   Calcium 9.8  8.4 - 30.8 mg/dL   GFR calc non Af Amer 76 (*) >90 mL/min   GFR calc Af Amer 88 (*) >90 mL/min   Comment: (NOTE)     The eGFR has been calculated using the CKD EPI equation.     This calculation has not been validated in all clinical situations.     eGFR's persistently <90 mL/min signify possible Chronic Kidney     Disease.  PHOSPHORUS      Status: Abnormal   Collection Time    08/29/13  4:43 AM      Result Value Range   Phosphorus 5.2 (*) 2.3 - 4.6 mg/dL  CBC     Status: Abnormal   Collection Time    08/29/13  4:43 AM      Result Value Range   WBC 11.5 (*) 4.0 - 10.5 K/uL   RBC 2.68 (*) 3.87 - 5.11 MIL/uL   Hemoglobin 8.2 (*) 12.0 - 15.0 g/dL   HCT 65.7 (*) 84.6 - 96.2 %  MCV 92.9  78.0 - 100.0 fL   MCH 30.6  26.0 - 34.0 pg   MCHC 32.9  30.0 - 36.0 g/dL   RDW 16.1  09.6 - 04.5 %   Platelets 417 (*) 150 - 400 K/uL  GLUCOSE, CAPILLARY     Status: Abnormal   Collection Time    08/29/13  7:56 AM      Result Value Range   Glucose-Capillary 114 (*) 70 - 99 mg/dL  GLUCOSE, CAPILLARY     Status: Abnormal   Collection Time    08/29/13  4:06 PM      Result Value Range   Glucose-Capillary 115 (*) 70 - 99 mg/dL  GLUCOSE, CAPILLARY     Status: Abnormal   Collection Time    08/29/13 11:35 PM      Result Value Range   Glucose-Capillary 119 (*) 70 - 99 mg/dL  COMPREHENSIVE METABOLIC PANEL     Status: Abnormal   Collection Time    08/30/13  5:03 AM      Result Value Range   Sodium 137  135 - 145 mEq/L   Potassium 3.3 (*) 3.5 - 5.1 mEq/L   Chloride 104  96 - 112 mEq/L   CO2 21  19 - 32 mEq/L   Glucose, Bld 122 (*) 70 - 99 mg/dL   BUN 16  6 - 23 mg/dL   Creatinine, Ser 4.09  0.50 - 1.10 mg/dL   Calcium 9.9  8.4 - 81.1 mg/dL   Total Protein 7.5  6.0 - 8.3 g/dL   Albumin 2.9 (*) 3.5 - 5.2 g/dL   AST 13  0 - 37 U/L   ALT 21  0 - 35 U/L   Alkaline Phosphatase 149 (*) 39 - 117 U/L   Total Bilirubin 0.2 (*) 0.3 - 1.2 mg/dL   GFR calc non Af Amer 76 (*) >90 mL/min   GFR calc Af Amer 88 (*) >90 mL/min   Comment: (NOTE)     The eGFR has been calculated using the CKD EPI equation.     This calculation has not been validated in all clinical situations.     eGFR's persistently <90 mL/min signify possible Chronic Kidney     Disease.  MAGNESIUM     Status: None   Collection Time    08/30/13  5:03 AM      Result Value  Range   Magnesium 1.6  1.5 - 2.5 mg/dL  PHOSPHORUS     Status: Abnormal   Collection Time    08/30/13  5:03 AM      Result Value Range   Phosphorus 5.2 (*) 2.3 - 4.6 mg/dL  CBC     Status: Abnormal   Collection Time    08/30/13  5:03 AM      Result Value Range   WBC 10.2  4.0 - 10.5 K/uL   RBC 2.71 (*) 3.87 - 5.11 MIL/uL   Hemoglobin 8.2 (*) 12.0 - 15.0 g/dL   HCT 91.4 (*) 78.2 - 95.6 %   MCV 91.9  78.0 - 100.0 fL   MCH 30.3  26.0 - 34.0 pg   MCHC 32.9  30.0 - 36.0 g/dL   RDW 21.3  08.6 - 57.8 %   Platelets 378  150 - 400 K/uL  DIFFERENTIAL     Status: None   Collection Time    08/30/13  5:03 AM      Result Value Range   Neutrophils Relative % 66  43 -  77 %   Lymphocytes Relative 20  12 - 46 %   Monocytes Relative 10  3 - 12 %   Eosinophils Relative 3  0 - 5 %   Basophils Relative 1  0 - 1 %   Neutro Abs 6.8  1.7 - 7.7 K/uL   Lymphs Abs 2.0  0.7 - 4.0 K/uL   Monocytes Absolute 1.0  0.1 - 1.0 K/uL   Eosinophils Absolute 0.3  0.0 - 0.7 K/uL   Basophils Absolute 0.1  0.0 - 0.1 K/uL   WBC Morphology MILD LEFT SHIFT (1-5% METAS, OCC MYELO, OCC BANDS)     Smear Review GIANT PLATELETS SEEN    TRIGLYCERIDES     Status: None   Collection Time    08/30/13  5:03 AM      Result Value Range   Triglycerides 104  <150 mg/dL   Comment: Performed at [facility]    Imaging / Studies: No results found.  Medications / Allergies: per chart  Antibiotics: Anti-infectives   Start     Dose/Rate Route Frequency Ordered Stop   08/17/13 0600  clindamycin (CLEOCIN) IVPB 600 mg  Status:  Discontinued    Comments:  Pharmacy may adjust dosing strength, interval, or rate of medication as needed for optimal therapy for the patient Send with patient on call to the OR.  Anesthesia to complete antibiotic administration <46min prior to incision per [facility].   600 mg 100 mL/hr over 30 Minutes Intravenous On call to O.R. 08/16/13 1720 08/17/13 0740   08/17/13 0600  gentamicin  (GARAMYCIN) IVPB 100 mg  Status:  Discontinued    Comments:  Pharmacy may adjust dosing strength, schedule, rate of infusion, etc as needed to optimize therapy Send with patient on call to the OR.  Anesthesia to complete antibiotic administration <57min prior to incision per [facility].   100 mg 200 mL/hr over 30 Minutes Intravenous On call to O.R. 08/16/13 1720 08/16/13 1732   08/16/13 1732  gentamicin (GARAMYCIN) 370 mg in dextrose 5 % 100 mL IVPB     5 mg/kg  74.8 kg 218.5 mL/hr over 30 Minutes Intravenous 30 min pre-op 08/16/13 1731 08/16/13 1836

## 2013-08-31 ENCOUNTER — Telehealth (INDEPENDENT_AMBULATORY_CARE_PROVIDER_SITE_OTHER): Payer: Self-pay

## 2013-08-31 LAB — GLUCOSE, CAPILLARY
Glucose-Capillary: 100 mg/dL — ABNORMAL HIGH (ref 70–99)
Glucose-Capillary: 111 mg/dL — ABNORMAL HIGH (ref 70–99)
Glucose-Capillary: 94 mg/dL (ref 70–99)

## 2013-08-31 LAB — BASIC METABOLIC PANEL
Calcium: 9.6 mg/dL (ref 8.4–10.5)
Chloride: 103 mEq/L (ref 96–112)
GFR calc Af Amer: 87 mL/min — ABNORMAL LOW (ref >90)
Potassium: 3.4 mEq/L — ABNORMAL LOW (ref 3.5–5.1)
Sodium: 138 mEq/L (ref 135–145)

## 2013-08-31 LAB — PHOSPHORUS: Phosphorus: 5 mg/dL — ABNORMAL HIGH (ref 2.3–4.6)

## 2013-08-31 NOTE — Telephone Encounter (Addendum)
F/U call from [email or web address]  to inform Dr. Michaell Cowing patient is going to be discharged home in a few days  .   If any concerns call Mrs. Larach @ [phone]

## 2013-08-31 NOTE — Progress Notes (Addendum)
Jane English [id number] 1971-08-08  CARE TEAM:  PCP: Syliva Overman, MD  Outpatient Care Team: Patient Care Team: Kerri Perches, MD as PCP - General Ardeth Sportsman, MD as Consulting Physician (General Surgery) Karn Cassis, MD as Consulting Physician (Neurosurgery) Beverley Fiedler, MD as Consulting Physician (Gastroenterology)  Inpatient Treatment Team: Treatment Team: Attending Provider: Ardeth Sportsman, MD; Registered Nurse: Tristan Schroeder, RN; Consulting Physician: Palliative Doristine Johns; Dietitian: Lavena Bullion, RD; Registered Nurse: Lattie Haw, RN; Technician: Oren Bracket [place], NT; Technician: Valetta Close, [place]; Technician: Melvenia Beam, NT; Technician: Vella Raring, NT; Registered Nurse: Jonathon Bellows, RN; Licensed Practical Nurse: Dorthula Matas, Student-LPN; Technician: Tommi Rumps, NT; Technician: Harrie Jeans Debbink, NT   Subjective: Moving bowels daily.  Worried that is not enough  crampy abd pain LLQ.  Has not been taking oxycodone.  Did not realize that she was supposed to ask for it.  Gets better when she takes it, but has been trying to avoid using it.  FMLA forms filled out her husband.  Apparently husband had a planned vacation for her next week.  Wanted to know if we can help with postponing that given her prolonged hospital stay  Tol solid diet  Walking well  Objective:  Vital signs:  Filed Vitals:   08/30/13 1400 08/30/13 1640 08/30/13 2243 08/31/13 0509  BP: 97/50 97/68 112/68 100/61  Pulse: 103 106 111 91  Temp: 99 F (37.2 C)  100.2 F (37.9 C) 98.2 F (36.8 C)  TempSrc: Oral  Oral Oral  Resp: 18 16 20 18   Height:      Weight:      SpO2: 100% 100% 96% 99%    Last BM Date: 08/29/13  Intake/Output   Yesterday:  10/27 0701 - 10/28 0700 In: 1537.7 [P.O.:840; I.V.:214.7; TPN:483] Out: 1900 [Urine:1900] This shift:     Bowel function:  Flatus: YES  BM: YES.  Drain: NGT  out  Physical Exam:  General: Pt resting but  awakens/alert/oriented x4 in mild distress.  Alert Eyes: PERRL, normal EOM.  Sclera clear.  No icterus Neuro: CN II-XII intact w/o focal sensory/motor deficits. Lymph: No head/neck/groin lymphadenopathy Psych:  No delerium/psychosis/paranoia.  Depressed HENT: Normocephalic, Mucus membranes moist.  No thrush.  NGT in place Neck: Supple, No tracheal deviation Chest: No chest wall pain w good excursion CV:  Pulses intact.  Regular rhythm MS: Normal AROM mjr joints.  No obvious deformity Abdomen: Soft.  Obese.  Nondistended.  Less sensitive at incision.  Incisions clean.   Normal healing ridge at incision - sensitive to light touch.  No incarcerated hernias.  Mildly LLQ Ext:  SCDs BLE.  No mjr edema.  No cyanosis Skin: No petechiae / purpura   Problem List:   Principal Problem:   SBO (small bowel obstruction) Active Problems:   Chronic pain syndrome   Anxiety state, unspecified   DEPRESSION, CHRONIC   Chronic constipation   PTSD (post-traumatic stress disorder)   Chronic migraine without aura   Palliative care encounter   Abdominal pain, unspecified site   Diabetes  SURGERY 07/29/2013 POST-OPERATIVE DIAGNOSIS: recurrent abdominal pain,extensive adhesions   PROCEDURE: Procedure(s):  diagnostic laporoscopy  LYSIS OF ADHESIONS  serosal repair  SURGEON: Surgeon(s):  Ardeth Sportsman, MD  Kandis Cocking, MD - Asst      Assessment  [name]  42 y.o. female  15 Days Post-Op    Partial small bowel obstruction in patient with chronic  pain on chronic narcotics s/p significant adhesiolysis & repeat exlap with SB resection - IMPROVING  Plan:  -d/c TNA.    -Solid diet.  ?cal counts to see what she is actually eating  -Pain control a challenge. Ice/heat/tylenol RTC.  Oxy IR PO per palliative care.  Again went over the usage of when necessary medications.  I am wary of putting her on scheduled narcotics.  She does not want  that.  I stressed to her good pain control and faster recovery.  Had a long conversation with her.  Tried to note how she is making improvements.  Depression and chronic pain and anxiety a major burden to her feeling of recovery.  I want her it would take several weeks if not months for her crampy abdominal pain issues to subside or stabilized.  I am skeptical that she will be pain free after this, but I am hopeful that she will get better.  I am worried that her insight into this is still poor.  I asked that she have her husband drop off the paperwork for the airline excuse.  It is reasonable to help protect them and delay the vacation until she is stronger.  -PRN nausea control.   -sitz baths.  Analpram for anal discomfort now  -no new c/o urinary burning s/p pyridium x 2d - follow  -VTE prophylaxis- SCDs, etc  -mobilize as tolerated to help recovery.  The more she can walk, the better.  -?control depression.  Difficult with ileus/SBO.  D/w Palliative Care team in past.  She needs to get back to her chronic pain specialist at [facility] at some point, but this is not a great time to radically change her regimen if possible.    D/C patient from hospital when patient meets criteria (anticipate tomorrow??:  Tolerating oral intake well Ambulating in walkways Adequate pain control without IV medications Urinating  Having flatus    -I updated the patient's status to the patient in the room.  Recommendations were made.  Questions were answered.  She seems to be more scared and anxious now.  Perhaps worried about if she will bounce back when she gets home.  I tried to note that she has made a lot of progress in the past week and he sees no strong evidence that she is falling back or getting worse.  Hopefully she agrees.    Ardeth Sportsman, M.D., F.A.C.S. Gastrointestinal and Minimally Invasive Surgery [facility] [address], [address] [place], [place]  [address] [phone] Main / Paging   08/31/2013   Results:   Labs: Results for orders placed during the hospital encounter of 08/08/13 (from the past 48 hour(s))  GLUCOSE, CAPILLARY     Status: Abnormal   Collection Time    08/29/13  4:06 PM      Result Value Range   Glucose-Capillary 115 (*) 70 - 99 mg/dL  GLUCOSE, CAPILLARY     Status: Abnormal   Collection Time    08/29/13 11:35 PM      Result Value Range   Glucose-Capillary 119 (*) 70 - 99 mg/dL  COMPREHENSIVE METABOLIC PANEL     Status: Abnormal   Collection Time    08/30/13  5:03 AM      Result Value Range   Sodium 137  135 - 145 mEq/L   Potassium 3.3 (*) 3.5 - 5.1 mEq/L   Chloride 104  96 - 112 mEq/L   CO2 21  19 - 32  mEq/L   Glucose, Bld 122 (*) 70 - 99 mg/dL   BUN 16  6 - 23 mg/dL   Creatinine, Ser 1.61  0.50 - 1.10 mg/dL   Calcium 9.9  8.4 - 09.6 mg/dL   Total Protein 7.5  6.0 - 8.3 g/dL   Albumin 2.9 (*) 3.5 - 5.2 g/dL   AST 13  0 - 37 U/L   ALT 21  0 - 35 U/L   Alkaline Phosphatase 149 (*) 39 - 117 U/L   Total Bilirubin 0.2 (*) 0.3 - 1.2 mg/dL   GFR calc non Af Amer 76 (*) >90 mL/min   GFR calc Af Amer 88 (*) >90 mL/min   Comment: (NOTE)     The eGFR has been calculated using the CKD EPI equation.     This calculation has not been validated in all clinical situations.     eGFR's persistently <90 mL/min signify possible Chronic Kidney     Disease.  MAGNESIUM     Status: None   Collection Time    08/30/13  5:03 AM      Result Value Range   Magnesium 1.6  1.5 - 2.5 mg/dL  PHOSPHORUS     Status: Abnormal   Collection Time    08/30/13  5:03 AM      Result Value Range   Phosphorus 5.2 (*) 2.3 - 4.6 mg/dL  CBC     Status: Abnormal   Collection Time    08/30/13  5:03 AM      Result Value Range   WBC 10.2  4.0 - 10.5 K/uL   RBC 2.71 (*) 3.87 - 5.11 MIL/uL   Hemoglobin 8.2 (*) 12.0 - 15.0 g/dL   HCT 04.5 (*) 40.9 - 81.1 %   MCV 91.9  78.0 - 100.0 fL   MCH 30.3  26.0 - 34.0 pg   MCHC 32.9  30.0  - 36.0 g/dL   RDW 91.4  78.2 - 95.6 %   Platelets 378  150 - 400 K/uL  DIFFERENTIAL     Status: None   Collection Time    08/30/13  5:03 AM      Result Value Range   Neutrophils Relative % 66  43 - 77 %   Lymphocytes Relative 20  12 - 46 %   Monocytes Relative 10  3 - 12 %   Eosinophils Relative 3  0 - 5 %   Basophils Relative 1  0 - 1 %   Neutro Abs 6.8  1.7 - 7.7 K/uL   Lymphs Abs 2.0  0.7 - 4.0 K/uL   Monocytes Absolute 1.0  0.1 - 1.0 K/uL   Eosinophils Absolute 0.3  0.0 - 0.7 K/uL   Basophils Absolute 0.1  0.0 - 0.1 K/uL   WBC Morphology MILD LEFT SHIFT (1-5% METAS, OCC MYELO, OCC BANDS)     Smear Review GIANT PLATELETS SEEN    TRIGLYCERIDES     Status: None   Collection Time    08/30/13  5:03 AM      Result Value Range   Triglycerides 104  <150 mg/dL   Comment: Performed at [facility]  PREALBUMIN     Status: None   Collection Time    08/30/13  5:03 AM      Result Value Range   Prealbumin 19.9  17.0 - 34.0 mg/dL   Comment: Performed at [facility]  GLUCOSE, CAPILLARY     Status: Abnormal   Collection Time  08/30/13  7:49 AM      Result Value Range   Glucose-Capillary 110 (*) 70 - 99 mg/dL  GLUCOSE, CAPILLARY     Status: Abnormal   Collection Time    08/30/13  4:38 PM      Result Value Range   Glucose-Capillary 114 (*) 70 - 99 mg/dL  GLUCOSE, CAPILLARY     Status: Abnormal   Collection Time    08/30/13 11:57 PM      Result Value Range   Glucose-Capillary 111 (*) 70 - 99 mg/dL  BASIC METABOLIC PANEL     Status: Abnormal   Collection Time    08/31/13  6:00 AM      Result Value Range   Sodium 138  135 - 145 mEq/L   Potassium 3.4 (*) 3.5 - 5.1 mEq/L   Chloride 103  96 - 112 mEq/L   CO2 23  19 - 32 mEq/L   Glucose, Bld 104 (*) 70 - 99 mg/dL   BUN 16  6 - 23 mg/dL   Creatinine, Ser 1.61  0.50 - 1.10 mg/dL   Calcium 9.6  8.4 - 09.6 mg/dL   GFR calc non Af Amer 75 (*) >90 mL/min   GFR calc Af Amer 87 (*) >90 mL/min   Comment: (NOTE)     The  eGFR has been calculated using the CKD EPI equation.     This calculation has not been validated in all clinical situations.     eGFR's persistently <90 mL/min signify possible Chronic Kidney     Disease.  PHOSPHORUS     Status: Abnormal   Collection Time    08/31/13  6:00 AM      Result Value Range   Phosphorus 5.0 (*) 2.3 - 4.6 mg/dL    Imaging / Studies: No results found.  Medications / Allergies: per chart  Antibiotics: Anti-infectives   Start     Dose/Rate Route Frequency Ordered Stop   08/17/13 0600  clindamycin (CLEOCIN) IVPB 600 mg  Status:  Discontinued    Comments:  Pharmacy may adjust dosing strength, interval, or rate of medication as needed for optimal therapy for the patient Send with patient on call to the OR.  Anesthesia to complete antibiotic administration <9min prior to incision per [facility].   600 mg 100 mL/hr over 30 Minutes Intravenous On call to O.R. 08/16/13 1720 08/17/13 0740   08/17/13 0600  gentamicin (GARAMYCIN) IVPB 100 mg  Status:  Discontinued    Comments:  Pharmacy may adjust dosing strength, schedule, rate of infusion, etc as needed to optimize therapy Send with patient on call to the OR.  Anesthesia to complete antibiotic administration <65min prior to incision per [facility].   100 mg 200 mL/hr over 30 Minutes Intravenous On call to O.R. 08/16/13 1720 08/16/13 1732   08/16/13 1732  gentamicin (GARAMYCIN) 370 mg in dextrose 5 % 100 mL IVPB     5 mg/kg  74.8 kg 218.5 mL/hr over 30 Minutes Intravenous 30 min pre-op 08/16/13 1731 08/16/13 1836

## 2013-08-31 NOTE — Progress Notes (Signed)
Calorie Count Note  Intervention:  - Educated pt on high calorie/protein diet and provided handouts with RD contact information - Will continue to monitor   72 hour calorie count ordered, ended last night.   Diet: Regular  Supplements: Ensure Complete TID  10/24  Dinner: 255 calories, 19g protein  Supplements: 175 calories, 7g protein   10/25  Breakfast: 250 calories, 3g protein  Dinner: 560 calories, 5g protein   10/26  Breakfast: 150 calories, 8g protein  Dinner: 945 calories, 6g protein  Supplements: 350 calories, 13g protein  10/27 Breakfast: 500 calories, 6g protein Dinner: 474 calories, 21g protein  Total average intake: 915 kcal (52% of minimum estimated needs)  22g protein (24% of minimum estimated needs)  Surgeon d/c TPN and told pt that her crampy abdominal pain may last several weeks/months. Partial small bowel obstruction improving.   Nutrition Dx: Inadequate oral intake related to abdominal pain as evidenced by pt consuming <50% of estimated nutritional needs - improving  Goal: Pt to consume >50% of meals/supplements - not met all the time but improving   Levon Hedger MS, RD, LDN [phone] Pager [phone] After Hours Pager

## 2013-09-01 LAB — GLUCOSE, CAPILLARY: Glucose-Capillary: 85 mg/dL (ref 70–99)

## 2013-09-01 MED ORDER — OXYCODONE HCL 5 MG PO TABS: 5.0000 mg | ORAL_TABLET | Freq: Four times a day (QID) | ORAL | Status: DC | PRN

## 2013-09-01 NOTE — Progress Notes (Signed)
Per MD order, PICC line removed. Cath intact at 40cm. Vaseline pressure gauze to site, pressure held x 5min. No bleeding to site. Pt instructed to keep dressing CDI x 24 hours. Avoid heavy lifting, pushing or pulling x 24 hours,  If bleeding occurs hold pressure, if bleeding does not stop contact MD or go to the ED. Pt does not have any questions. Ikhlas Albo M 

## 2013-09-01 NOTE — Progress Notes (Signed)
Patient ready for d/c home. Husband arrived and assisted packing up belongings. Cleared per physical therapy for stair-climbing at home. D/C instructions given in presence of husband. PICC line d/c'd earlier per IV nurse with dsg intact to rt upper arm. Patient ambulatory without any complaints voiced. Transferred off floor to vehicle via wheelchair.

## 2013-09-01 NOTE — Telephone Encounter (Signed)
See below

## 2013-09-01 NOTE — Evaluation (Signed)
Physical Therapy Evaluation Patient Details Name: BRIGHTEN BUZZELLI MRN: [id number] DOB: 07/28/1971 Today's Date: 09/01/2013 Time: 4098-1191 PT Time Calculation (min): 20 min  PT Assessment / Plan / Recommendation History of Present Illness     Clinical Impression  Pt s/p SBO with surgical intervention and follow up lysis of adhesions is performing basic mobility tasks without assist and managing stairs this date with min guard assist and use of railing.  From PT perspective, pt is ready for d/c home with assist of family/friends.      PT Assessment  Patent does not need any further PT services    Follow Up Recommendations  No PT follow up    Does the patient have the potential to tolerate intense rehabilitation      Barriers to Discharge        Equipment Recommendations  None recommended by PT    Recommendations for Other Services     Frequency      Precautions / Restrictions Precautions Precautions: Fall Restrictions Weight Bearing Restrictions: No         Mobility  Bed Mobility Bed Mobility: Not assessed (Pt states she has been in/out of bed on own with min difficu) Transfers Transfers: Sit to Stand;Stand to Sit Sit to Stand: 6: Modified independent (Device/Increase time) Stand to Sit: 6: Modified independent (Device/Increase time) Details for Transfer Assistance: guarded and slow, but able to perform Ambulation/Gait Ambulation/Gait Assistance: 7: Independent Ambulation Distance (Feet): 200 Feet Assistive device: None Ambulation/Gait Assistance Details: guarded 2* abd discomfort  Gait Pattern: Within Functional Limits Gait velocity: decr Stairs Assistance: 4: Min guard Stair Management Technique: One rail Right;Step to pattern;Forwards Number of Stairs: 20    Exercises     PT Diagnosis:    PT Problem List:   PT Treatment Interventions:       PT Goals(Current goals can be found in the care plan section) Acute Rehab PT Goals Patient Stated Goal: Home  to enjoy my house PT Goal Formulation: No goals set, d/c therapy  Visit Information  Last PT Received On: 09/01/13 Assistance Needed: +1       Prior Functioning  Home Living Family/patient expects to be discharged to:: Private residence Living Arrangements: Spouse/significant other Available Help at Discharge: Family Type of Home: House Home Access: Stairs to enter Secretary/administrator of Steps: 20 Entrance Stairs-Rails: Right Home Layout: One level Home Equipment: Cane - single point;Walker - 2 wheels Prior Function Level of Independence: Independent Communication Communication: No difficulties    Cognition  Cognition Arousal/Alertness: Awake/alert Behavior During Therapy: WFL for tasks assessed/performed Overall Cognitive Status: Within Functional Limits for tasks assessed    Extremity/Trunk Assessment Upper Extremity Assessment Upper Extremity Assessment: Overall WFL for tasks assessed Lower Extremity Assessment Lower Extremity Assessment: Overall WFL for tasks assessed Cervical / Trunk Assessment Cervical / Trunk Assessment: Normal   Balance    End of Session PT - End of Session Activity Tolerance: Patient tolerated treatment well Patient left: in bed;with call bell/phone within reach Nurse Communication: Mobility status  GP     Mykala Mccready 09/01/2013, 2:18 PM

## 2013-09-01 NOTE — Discharge Summary (Signed)
Physician Discharge Summary  Patient ID: DINITA MIGLIACCIO MRN: [id number] DOB/AGE: [birth year] 42 y.o.  Admit date: 08/08/2013 Discharge date: 09/01/2013  Admission Diagnoses: Principal Problem:   SBO (small bowel obstruction) Active Problems:   Chronic pain syndrome   Anxiety state, unspecified   DEPRESSION, CHRONIC   Chronic constipation   PTSD (post-traumatic stress disorder)   Chronic migraine without aura   Palliative care encounter   Abdominal pain, unspecified site   Diabetes  Discharge Diagnoses:  Principal Problem:   SBO (small bowel obstruction) Active Problems:   Chronic pain syndrome   Anxiety state, unspecified   DEPRESSION, CHRONIC   Chronic constipation   PTSD (post-traumatic stress disorder)   Chronic migraine without aura   Palliative care encounter   Abdominal pain, unspecified site   Diabetes   Discharged Condition: fair  Hospital Course: Pt underwent lap LOA for chronic adhesions & abdominal pain.  Readmitted with early SBO.  Treated with bowel rest, IVF, TNA, NGT.  Seemed to opened up w flatus but could not tolerate PO.   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  Postoperatively, the patient mobilized in the hallways.  Suffrred w severe pain & anxiety but ileus gradually resolved.  Tolerated clears.  NGT removed.  Advanced to a solid diet gradually.  Pain was controlled and transitioned off IV medications to PO meds.  With h/o PTSD, anxiety, depression, chronic back pain, chronic narcotic use; this was a major challenge.  Palliative care, PT, Nutrition, etc involved in her care as well.  By the time of discharge, the patient was walking well the hallways, eating ~50 % of solid meals, having flatus & BMs.  Pain was controlled on an oral regimen when she took the meds.  She kept trying to avoid meds but relented to increasing use.  TNA was weaned off.  Based on meeting DC criteria and recovering well, I felt it was safe for the  patient to be discharged home with close followup.  Instructions were discussed in detail many times.  They are written as well.  She is very anxious but consolable.  We will work to have close followup   Consults: palliative care  Significant Diagnostic Studies:  Results for orders placed during the hospital encounter of 08/08/13 (from the past 72 hour(s))  GLUCOSE, CAPILLARY     Status: Abnormal   Collection Time    08/29/13  4:06 PM      Result Value Range   Glucose-Capillary 115 (*) 70 - 99 mg/dL  GLUCOSE, CAPILLARY     Status: Abnormal   Collection Time    08/29/13 11:35 PM      Result Value Range   Glucose-Capillary 119 (*) 70 - 99 mg/dL  COMPREHENSIVE METABOLIC PANEL     Status: Abnormal   Collection Time    08/30/13  5:03 AM      Result Value Range   Sodium 137  135 - 145 mEq/L   Potassium 3.3 (*) 3.5 - 5.1 mEq/L   Chloride 104  96 - 112 mEq/L   CO2 21  19 - 32 mEq/L   Glucose, Bld 122 (*) 70 - 99 mg/dL   BUN 16  6 - 23 mg/dL   Creatinine, Ser 4.09  0.50 - 1.10 mg/dL   Calcium 9.9  8.4 - 81.1 mg/dL   Total Protein 7.5  6.0 - 8.3 g/dL   Albumin 2.9 (*) 3.5 - 5.2 g/dL   AST 13  0 - 37 U/L   ALT 21  0 - 35 U/L   Alkaline Phosphatase 149 (*) 39 - 117 U/L   Total Bilirubin 0.2 (*) 0.3 - 1.2 mg/dL   GFR calc non Af Amer 76 (*) >90 mL/min   GFR calc Af Amer 88 (*) >90 mL/min   Comment: (NOTE)     The eGFR has been calculated using the CKD EPI equation.     This calculation has not been validated in all clinical situations.     eGFR's persistently <90 mL/min signify possible Chronic Kidney     Disease.  MAGNESIUM     Status: None   Collection Time    08/30/13  5:03 AM      Result Value Range   Magnesium 1.6  1.5 - 2.5 mg/dL  PHOSPHORUS     Status: Abnormal   Collection Time    08/30/13  5:03 AM      Result Value Range   Phosphorus 5.2 (*) 2.3 - 4.6 mg/dL  CBC     Status: Abnormal   Collection Time    08/30/13  5:03 AM      Result Value Range   WBC 10.2  4.0 -  10.5 K/uL   RBC 2.71 (*) 3.87 - 5.11 MIL/uL   Hemoglobin 8.2 (*) 12.0 - 15.0 g/dL   HCT 16.1 (*) 09.6 - 04.5 %   MCV 91.9  78.0 - 100.0 fL   MCH 30.3  26.0 - 34.0 pg   MCHC 32.9  30.0 - 36.0 g/dL   RDW 40.9  81.1 - 91.4 %   Platelets 378  150 - 400 K/uL  DIFFERENTIAL     Status: None   Collection Time    08/30/13  5:03 AM      Result Value Range   Neutrophils Relative % 66  43 - 77 %   Lymphocytes Relative 20  12 - 46 %   Monocytes Relative 10  3 - 12 %   Eosinophils Relative 3  0 - 5 %   Basophils Relative 1  0 - 1 %   Neutro Abs 6.8  1.7 - 7.7 K/uL   Lymphs Abs 2.0  0.7 - 4.0 K/uL   Monocytes Absolute 1.0  0.1 - 1.0 K/uL   Eosinophils Absolute 0.3  0.0 - 0.7 K/uL   Basophils Absolute 0.1  0.0 - 0.1 K/uL   WBC Morphology MILD LEFT SHIFT (1-5% METAS, OCC MYELO, OCC BANDS)     Smear Review GIANT PLATELETS SEEN    TRIGLYCERIDES     Status: None   Collection Time    08/30/13  5:03 AM      Result Value Range   Triglycerides 104  <150 mg/dL   Comment: Performed at [facility]  PREALBUMIN     Status: None   Collection Time    08/30/13  5:03 AM      Result Value Range   Prealbumin 19.9  17.0 - 34.0 mg/dL   Comment: Performed at [facility]  GLUCOSE, CAPILLARY     Status: Abnormal   Collection Time    08/30/13  7:49 AM      Result Value Range   Glucose-Capillary 110 (*) 70 - 99 mg/dL  GLUCOSE, CAPILLARY     Status: Abnormal   Collection Time    08/30/13  4:38 PM      Result Value Range   Glucose-Capillary 114 (*) 70 - 99 mg/dL  GLUCOSE, CAPILLARY  Status: Abnormal   Collection Time    08/30/13 11:57 PM      Result Value Range   Glucose-Capillary 111 (*) 70 - 99 mg/dL  BASIC METABOLIC PANEL     Status: Abnormal   Collection Time    08/31/13  6:00 AM      Result Value Range   Sodium 138  135 - 145 mEq/L   Potassium 3.4 (*) 3.5 - 5.1 mEq/L   Chloride 103  96 - 112 mEq/L   CO2 23  19 - 32 mEq/L   Glucose, Bld 104 (*) 70 - 99 mg/dL   BUN 16  6 - 23  mg/dL   Creatinine, Ser 3.66  0.50 - 1.10 mg/dL   Calcium 9.6  8.4 - 44.0 mg/dL   GFR calc non Af Amer 75 (*) >90 mL/min   GFR calc Af Amer 87 (*) >90 mL/min   Comment: (NOTE)     The eGFR has been calculated using the CKD EPI equation.     This calculation has not been validated in all clinical situations.     eGFR's persistently <90 mL/min signify possible Chronic Kidney     Disease.  PHOSPHORUS     Status: Abnormal   Collection Time    08/31/13  6:00 AM      Result Value Range   Phosphorus 5.0 (*) 2.3 - 4.6 mg/dL  GLUCOSE, CAPILLARY     Status: Abnormal   Collection Time    08/31/13  7:52 AM      Result Value Range   Glucose-Capillary 100 (*) 70 - 99 mg/dL  GLUCOSE, CAPILLARY     Status: None   Collection Time    08/31/13  4:13 PM      Result Value Range   Glucose-Capillary 94  70 - 99 mg/dL  GLUCOSE, CAPILLARY     Status: Abnormal   Collection Time    08/31/13 11:52 PM      Result Value Range   Glucose-Capillary 103 (*) 70 - 99 mg/dL   Comment 1 Notify RN    GLUCOSE, CAPILLARY     Status: None   Collection Time    09/01/13  8:07 AM      Result Value Range   Glucose-Capillary 85  70 - 99 mg/dL    Treatments:   POST-OPERATIVE DIAGNOSIS: chronic abdominal pain, SMALL BOWEL OBSTRUCTION with intestinal mesentery inflammatory mass   PROCEDURE: Procedure(s):  LAPAROSCOPY DIAGNOSTIC  LYSIS OF ADHESIONS  SMALL BOWEL RESECTION (JEJUNUM)  SURGEON: Surgeon(s):  Ardeth Sportsman, MD      Discharge Exam: Blood pressure 124/67, pulse 101, temperature 99 F (37.2 C), temperature source Oral, resp. rate 20, height 5\' 6"  (1.676 m), weight 164 lb 14.5 oz (74.8 kg), last menstrual period 08/23/2013, SpO2 99.00%.  General: Pt awake/alert/oriented x4 in NADistress.  Eyes: PERRL, normal EOM. Sclera clear. No icterus  Neuro: CN II-XII intact w/o focal sensory/motor deficits.  Lymph: No head/neck/groin lymphadenopathy  Psych: No delerium/psychosis/paranoia. Depressed, avoiding  eye contact but consolable HENT: Normocephalic, Mucus membranes moist. No thrush. NGT in place  Neck: Supple, No tracheal deviation  Chest: No chest wall pain w good excursion  CV: Pulses intact. Regular rhythm  MS: Normal AROM mjr joints. No obvious deformity  Abdomen: Soft. Obese. Nondistended. Less sensitive at incision. Incisions clean. Normal healing ridge at incision - sensitive to light touch. No incarcerated hernias. No periotnitis Ext: SCDs BLE. No mjr edema. No cyanosis  Skin: No petechiae / purpura  Disposition: 01-Home or Self Care  Discharge Orders   Future Orders Complete By Expires   Call MD for:  extreme fatigue  As directed    Call MD for:  extreme fatigue  As directed    Call MD for:  hives  As directed    Call MD for:  hives  As directed    Call MD for:  persistant nausea and vomiting  As directed    Call MD for:  persistant nausea and vomiting  As directed    Call MD for:  redness, tenderness, or signs of infection (pain, swelling, redness, odor or green/yellow discharge around incision site)  As directed    Call MD for:  redness, tenderness, or signs of infection (pain, swelling, redness, odor or green/yellow discharge around incision site)  As directed    Call MD for:  severe uncontrolled pain  As directed    Call MD for:  severe uncontrolled pain  As directed    Call MD for:  As directed    Comments:     Temperature > 101.53F   Call MD for:  As directed    Comments:     Temperature > 101.53F   Diet - low sodium heart healthy  As directed    Diet - low sodium heart healthy  As directed    Discharge instructions  As directed    Comments:     Please see discharge instruction sheets.  Also refer to handout given an office.  Please call our office if you have any questions or concerns [phone]   Discharge instructions  As directed    Comments:     Please see discharge instruction sheets.  Also refer to handout given an office.  Please call our office  if you have any questions or concerns [phone]   Discharge wound care:  As directed    Comments:     If you have closed incisions, shower and bathe over these incisions with soap and water every day.  Remove all surgical dressings on postoperative day #3.  You do not need to replace dressings over the closed incisions unless you feel more comfortable with a Band-Aid covering it.   If you have an open wound that requires packing, please see wound care instructions.  In general, remove all dressings, wash wound with soap and water and then replace with saline moistened gauze.  Do the dressing change at least every day.  Please call our office [phone] if you have further questions.   Discharge wound care:  As directed    Comments:     If you have closed incisions, shower and bathe over these incisions with soap and water every day.  Remove all surgical dressings on postoperative day #3.  You do not need to replace dressings over the closed incisions unless you feel more comfortable with a Band-Aid covering it.   If you have an open wound that requires packing, please see wound care instructions.  In general, remove all dressings, wash wound with soap and water and then replace with saline moistened gauze.  Do the dressing change at least every day.  Please call our office [phone] if you have further questions.   Driving Restrictions  As directed    Comments:     No driving until off narcotics and can safely swerve away without pain during an emergency   Driving Restrictions  As directed    Comments:     No driving until  off narcotics and can safely swerve away without pain during an emergency   Increase activity slowly  As directed    Comments:     Walk an hour a day.  Use 20-30 minute walks.  When you can walk 30 minutes without difficulty, increase to low impact/moderate activities such as biking, jogging, swimming, sexual activity..  Eventually can increase to unrestricted activity  when not feeling pain.  If you feel pain: STOP!Marland Kitchen   Let pain protect you from overdoing it.  Use ice/heat/over-the-counter pain medications to help minimize his soreness.  Use pain prescriptions as needed to remain active.  It is better to take extra pain medications and be more active than to stay bedridden to avoid all pain medications.   Increase activity slowly  As directed    Comments:     Walk an hour a day.  Use 20-30 minute walks.  When you can walk 30 minutes without difficulty, increase to low impact/moderate activities such as biking, jogging, swimming, sexual activity..  Eventually can increase to unrestricted activity when not feeling pain.  If you feel pain: STOP!Marland Kitchen   Let pain protect you from overdoing it.  Use ice/heat/over-the-counter pain medications to help minimize his soreness.  Use pain prescriptions as needed to remain active.  It is better to take extra pain medications and be more active than to stay bedridden to avoid all pain medications.   Lifting restrictions  As directed    Comments:     Avoid heavy lifting initially.  Do not push through pain.  You have no specific weight limit.  Coughing and sneezing or four more stressful to your incision than any lifting you will do. Pain will protect you from injury.  Therefore, avoid intense activity until off all narcotic pain medications.  Coughing and sneezing or four more stressful to your incision than any lifting he will do.   Lifting restrictions  As directed    Comments:     Avoid heavy lifting initially.  Do not push through pain.  You have no specific weight limit.  Coughing and sneezing or four more stressful to your incision than any lifting you will do. Pain will protect you from injury.  Therefore, avoid intense activity until off all narcotic pain medications.  Coughing and sneezing or four more stressful to your incision than any lifting he will do.   May shower / Bathe  As directed    May shower / Bathe  As directed     May walk up steps  As directed    May walk up steps  As directed    Sexual Activity Restrictions  As directed    Comments:     Sexual activity as tolerated.  Do not push through pain.  Pain will protect you from injury.   Sexual Activity Restrictions  As directed    Comments:     Sexual activity as tolerated.  Do not push through pain.  Pain will protect you from injury.   Walk with assistance  As directed    Comments:     Walk over an hour a day.  May use a walker/cane/companion to help with balance and stamina.   Walk with assistance  As directed    Comments:     Walk over an hour a day.  May use a walker/cane/companion to help with balance and stamina.       Medication List    STOP taking these medications  bismuth-metronidazole-tetracycline 140-125-125 MG per capsule  Commonly known as:  PYLERA     cyclobenzaprine 10 MG tablet  Commonly known as:  FLEXERIL     DURAGESIC 12 MCG/HR  Generic drug:  fentaNYL     lubiprostone 24 MCG capsule  Commonly known as:  AMITIZA     naproxen 500 MG tablet  Commonly known as:  NAPROSYN     potassium chloride 10 MEQ tablet  Commonly known as:  K-DUR,KLOR-CON     tiZANidine 4 MG tablet  Commonly known as:  ZANAFLEX     topiramate 100 MG tablet  Commonly known as:  TOPAMAX     triamterene-hydrochlorothiazide 75-50 MG per tablet  Commonly known as:  MAXZIDE      TAKE these medications       ALPRAZolam 0.5 MG tablet  Commonly known as:  XANAX  Take 0.5 mg by mouth 3 (three) times daily as needed for sleep or anxiety.     amLODipine 10 MG tablet  Commonly known as:  NORVASC  Take 10 mg by mouth every morning.     BIOTIN MAXIMUM STRENGTH 5 MG Caps  Generic drug:  Biotin  Take 10 mg by mouth daily.     budesonide-formoterol 160-4.5 MCG/ACT inhaler  Commonly known as:  SYMBICORT  Inhale 2 puffs into the lungs 2 (two) times daily.     carbamazepine 200 MG Cp12 12 hr capsule  Commonly known as:  EQUETRO  - Take  100-200 mg by mouth 2 (two) times daily. Patient takes 100mg  in am and 200mg  in pm of Equetro for bipolar issues  - Uses 100mg  capsules     cetirizine 10 MG tablet  Commonly known as:  ZYRTEC  Take 10 mg by mouth daily.     fluticasone 50 MCG/ACT nasal spray  Commonly known as:  FLONASE  Place 2 sprays into the nose daily.     ipratropium 0.02 % nebulizer solution  Commonly known as:  ATROVENT  Take 500 mcg by nebulization every 6 (six) hours as needed for wheezing.     lidocaine 5 % ointment  Commonly known as:  XYLOCAINE  Apply 1 application topically as needed (pain).     metFORMIN 500 MG 24 hr tablet  Commonly known as:  GLUCOPHAGE-XR  Take 500 mg by mouth daily with breakfast.     montelukast 10 MG tablet  Commonly known as:  SINGULAIR  TAKE 1 TABLET (10 MG TOTAL) BY MOUTH AT BEDTIME.     multivitamin with minerals Tabs tablet  Take 1 tablet by mouth daily.     oxyCODONE 5 MG immediate release tablet  Commonly known as:  Oxy IR/ROXICODONE  Take 1-3 tablets (5-15 mg total) by mouth every 6 (six) hours as needed for pain.     pantoprazole 40 MG tablet  Commonly known as:  PROTONIX  Take 40 mg by mouth daily.     PATADAY 0.2 % Soln  Generic drug:  Olopatadine HCl  Place 1 drop into both eyes daily.     rizatriptan 10 MG disintegrating tablet  Commonly known as:  MAXALT-MLT  Take 10 mg by mouth 2 (two) times daily as needed for migraine. May repeat in 2 hours if needed     VITAMIN D-1000 MAX ST 1000 UNITS tablet  Generic drug:  Cholecalciferol  Take 1,000 Units by mouth daily.     VOLTAREN 1 % Gel  Generic drug:  diclofenac sodium  Apply 2 g topically 4 (four) times daily  as needed. Pain           Follow-up Information   Follow up with Nichlas Pitera C., MD. Schedule an appointment as soon as possible for a visit in 2 weeks.   Specialty:  General Surgery   Contact information:   [address] [place] [address] [phone]        Signed: Ardeth Sportsman 09/01/2013, 7:31 AM

## 2013-09-01 NOTE — Telephone Encounter (Signed)
Pt denies that she told them that she was ready.  "they were the ones that told me"  Does agree that she meets goals to go home, just afraid to leave

## 2013-09-02 ENCOUNTER — Telehealth (INDEPENDENT_AMBULATORY_CARE_PROVIDER_SITE_OTHER): Payer: Self-pay

## 2013-09-02 NOTE — Telephone Encounter (Signed)
LMOM of the nurses line for the someone to call me back so we can discuss this pt having f/u care after surgery.

## 2013-09-02 NOTE — Telephone Encounter (Signed)
Jane English returned my call. I notified her about the Jane English having surgery by Dr Michaell Cowing and that the Jane English had been discharged this week with Oxycodone 5mg  #80. I asked when the Jane English's f/u appt is with them and the Jane English does not have a return appt. Jane English will call Jane English now to schedule a f/u appt with the pain clinic. I advised Jane English would see Dr Michaell Cowing in 2 wks.

## 2013-09-03 ENCOUNTER — Encounter (HOSPITAL_COMMUNITY): Payer: Self-pay | Admitting: Psychiatry

## 2013-09-03 ENCOUNTER — Telehealth (INDEPENDENT_AMBULATORY_CARE_PROVIDER_SITE_OTHER): Payer: Self-pay

## 2013-09-03 ENCOUNTER — Ambulatory Visit (INDEPENDENT_AMBULATORY_CARE_PROVIDER_SITE_OTHER): Payer: Medicare Other | Admitting: Psychiatry

## 2013-09-03 VITALS — BP 110/70 | Ht 66.0 in | Wt 150.0 lb

## 2013-09-03 DIAGNOSIS — F4310 Post-traumatic stress disorder, unspecified: Secondary | ICD-10-CM

## 2013-09-03 DIAGNOSIS — F319 Bipolar disorder, unspecified: Secondary | ICD-10-CM

## 2013-09-03 DIAGNOSIS — B370 Candidal stomatitis: Secondary | ICD-10-CM

## 2013-09-03 MED ORDER — CARBAMAZEPINE ER 300 MG PO CP12: 300.0000 mg | ORAL_CAPSULE | Freq: Every day | ORAL | Status: DC

## 2013-09-03 MED ORDER — FLUCONAZOLE 100 MG PO TABS: 100.0000 mg | ORAL_TABLET | Freq: Every day | ORAL | Status: DC

## 2013-09-03 MED ORDER — ALPRAZOLAM 0.5 MG PO TABS: 0.5000 mg | ORAL_TABLET | Freq: Three times a day (TID) | ORAL | Status: DC | PRN

## 2013-09-03 MED ORDER — ALPRAZOLAM 1 MG PO TABS: 1.0000 mg | ORAL_TABLET | Freq: Two times a day (BID) | ORAL | Status: DC

## 2013-09-03 NOTE — Telephone Encounter (Signed)
The pt called in complaining of pain.  She wanted to know if she can take more than one pill at a time of the Oxycodone.  I told her Dr Michaell Cowing wrote for her to take 1-3 tablets.  She also complains of sores in her mouth and it is really bothering her.  She is having a hard time eating and drinking with it.  Dr Michaell Cowing is unavailable so I spoke to Dr Jamey Ripa and he gave me an order for Diflucan 100 mg po x 2.  Order placed and message left for her husband that it was sent in.
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
## 2013-09-06 ENCOUNTER — Other Ambulatory Visit (INDEPENDENT_AMBULATORY_CARE_PROVIDER_SITE_OTHER): Payer: Self-pay | Admitting: Surgery

## 2013-09-06 ENCOUNTER — Other Ambulatory Visit (INDEPENDENT_AMBULATORY_CARE_PROVIDER_SITE_OTHER): Payer: Self-pay | Admitting: *Deleted

## 2013-09-06 DIAGNOSIS — J029 Acute pharyngitis, unspecified: Secondary | ICD-10-CM

## 2013-09-06 MED ORDER — MAGIC MOUTHWASH W/LIDOCAINE: 5.0000 mL | Freq: Four times a day (QID) | ORAL | Status: DC | PRN

## 2013-09-06 NOTE — Telephone Encounter (Signed)
Prescription re-sent to [facility] per patient's request.  Patient is aware and updated regarding this prescription and agreeable with plan at this time.

## 2013-09-06 NOTE — Telephone Encounter (Signed)
Called patient to update her.  Patient states that she wants it sent to [facility]

## 2013-09-06 NOTE — Telephone Encounter (Signed)
Rx sent to pharmacy   

## 2013-09-06 NOTE — Progress Notes (Signed)
Rx for magic mouthwash sent per her wishes

## 2013-09-06 NOTE — Telephone Encounter (Signed)
Patient called in this morning to report that she went to the pharmacy to pick up the prescription however when she got there it was for pills.  Patient reports she wants specifically magic mouthwash.  Patient states she has had this before and that is what worked for her.  Explained that I would send a message on to Dr. Michaell Cowing to ask if he would approve for a prescription of magic mouthwash and then I will let her know.  Patient states understanding at this time.  Patient states she did not pick up the prescription for the Diflucan because that is not what she feels will help her.

## 2013-09-09 ENCOUNTER — Other Ambulatory Visit: Payer: Self-pay

## 2013-09-09 DIAGNOSIS — M961 Postlaminectomy syndrome, not elsewhere classified: Secondary | ICD-10-CM | POA: Diagnosis not present

## 2013-09-09 DIAGNOSIS — M545 Low back pain, unspecified: Secondary | ICD-10-CM | POA: Diagnosis not present

## 2013-09-09 DIAGNOSIS — Z9889 Other specified postprocedural states: Secondary | ICD-10-CM | POA: Diagnosis not present

## 2013-09-09 DIAGNOSIS — G894 Chronic pain syndrome: Secondary | ICD-10-CM | POA: Diagnosis not present

## 2013-09-10 ENCOUNTER — Ambulatory Visit: Payer: Managed Care, Other (non HMO) | Admitting: Family Medicine

## 2013-09-13 ENCOUNTER — Encounter: Payer: Self-pay | Admitting: Family Medicine

## 2013-09-13 ENCOUNTER — Ambulatory Visit (INDEPENDENT_AMBULATORY_CARE_PROVIDER_SITE_OTHER): Payer: Managed Care, Other (non HMO) | Admitting: Family Medicine

## 2013-09-13 VITALS — BP 104/62 | HR 88 | Resp 18 | Ht 65.25 in | Wt 153.0 lb

## 2013-09-13 DIAGNOSIS — Z23 Encounter for immunization: Secondary | ICD-10-CM

## 2013-09-13 DIAGNOSIS — F329 Major depressive disorder, single episode, unspecified: Secondary | ICD-10-CM

## 2013-09-13 DIAGNOSIS — I10 Essential (primary) hypertension: Secondary | ICD-10-CM

## 2013-09-13 DIAGNOSIS — E119 Type 2 diabetes mellitus without complications: Secondary | ICD-10-CM

## 2013-09-13 DIAGNOSIS — J309 Allergic rhinitis, unspecified: Secondary | ICD-10-CM

## 2013-09-13 DIAGNOSIS — J45909 Unspecified asthma, uncomplicated: Secondary | ICD-10-CM

## 2013-09-13 NOTE — Patient Instructions (Addendum)
F/u 2nd week in January, call if you need me before  I am thankful that you are back home and doing better.  Only medication I am, prescribing for you now is amlodipine, zyrtec, singulair, symbicort , albuterol both the inhaler and the breathing treatment if needed.  Take medication as prescribed by psychiatry.  Pain medication will be sorted about through your pain Doctor and surgeon  Eat healthily, a lot of vegetable and fruit , and try to commit to daily exercise to help you to get stronger and feel better  STOP metformin, no blood sugar testing at home will be covered by insurance and is not necessary  HBA1C, chem 7 and EGFR and CBC in January, before visit

## 2013-09-13 NOTE — Progress Notes (Signed)
  Subjective:    Patient ID: Jane English, female    DOB: 1971-04-24, 42 y.o.   MRN: [id number]  HPI Pt in for f/u of recent prolonged hospitalization with re hospitalization following surgery for lysis of intestinal lesions. Today she is somewhat "child like " in her behavior, her spouse is with her and is very protective. I believe the psychological effect of hospital experience is taking its toll Still c/o significant pain and has a poor appetite afraid to eat in case she hurts Sheis re establishing with her therapist , which is a great thing as she certainly clearly need to "de stress " mentally and emotionally   Review of Systems See HPI Denies recent fever or chills. Denies sinus pressure, nasal congestion, ear pain or sore throat. Denies chest congestion, productive cough or wheezing. Denies chest pains, palpitations and leg swelling .   Denies dysuria, frequency, hesitancy or incontinence. Denies joint pain, swelling and limitation in mobility. Denies headaches, seizures, numbness, or tingling. C/o depression and  Anxiety, not suicidal or homicidal Denies skin break down or rash.        Objective:   Physical Exam  Patient alert and oriented and in no cardiopulmonary distress.  HEENT: No facial asymmetry, EOMI, no sinus tenderness,  oropharynx pink and moist.  Neck supple no adenopathy.  Chest: Clear to auscultation bilaterally.  CVS: S1, S2 no murmurs, no S3.  ABD: Soft  No guarding or rebound, tendr over incision site as to be expectedl.  Ext: No edema  MS: Adequate ROM spine, shoulders, hips and knees.  Skin: Intact, no ulcerations or rash noted.  Psych: Good eye contact, anxious and scared . Memory intact at times tearful , angry and depressed  CNS: CN 2-12 intact, power, tone and sensation normal throughout.       Assessment & Plan:

## 2013-09-14 ENCOUNTER — Telehealth (INDEPENDENT_AMBULATORY_CARE_PROVIDER_SITE_OTHER): Payer: Self-pay

## 2013-09-14 NOTE — Telephone Encounter (Signed)
Called pt to make f/u appt with Dr Michaell Cowing. I made the appt for 09/22/13 arrive at 11:30/11:45. The pt understands.

## 2013-09-15 ENCOUNTER — Encounter (INDEPENDENT_AMBULATORY_CARE_PROVIDER_SITE_OTHER): Payer: Medicare Other | Admitting: Surgery

## 2013-09-16 ENCOUNTER — Encounter (INDEPENDENT_AMBULATORY_CARE_PROVIDER_SITE_OTHER): Payer: Medicare Other | Admitting: Surgery

## 2013-09-17 ENCOUNTER — Telehealth (INDEPENDENT_AMBULATORY_CARE_PROVIDER_SITE_OTHER): Payer: Self-pay

## 2013-09-17 ENCOUNTER — Ambulatory Visit (INDEPENDENT_AMBULATORY_CARE_PROVIDER_SITE_OTHER): Payer: Managed Care, Other (non HMO) | Admitting: Psychiatry

## 2013-09-17 DIAGNOSIS — F319 Bipolar disorder, unspecified: Secondary | ICD-10-CM

## 2013-09-17 DIAGNOSIS — F29 Unspecified psychosis not due to a substance or known physiological condition: Secondary | ICD-10-CM | POA: Diagnosis not present

## 2013-09-17 DIAGNOSIS — K56609 Unspecified intestinal obstruction, unspecified as to partial versus complete obstruction: Secondary | ICD-10-CM

## 2013-09-17 MED ORDER — OXYCODONE HCL 5 MG PO TABS: 5.0000 mg | ORAL_TABLET | ORAL | Status: DC | PRN
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
## 2013-09-17 NOTE — Telephone Encounter (Signed)
Pt calling in requesting refill of roxicotin 5mg .  Let pt know that I will send message to [facility] and Dr. Michaell Cowing.  Pt can be reached at [phone]

## 2013-09-17 NOTE — Telephone Encounter (Signed)
Called pt to notify her that we do have a written rx for Oxycodone 5mg  #50 at the front desk. I have made a copy for chart.

## 2013-09-17 NOTE — Addendum Note (Signed)
Addended by: Ethlyn Gallery on: 09/17/2013 04:20 PM   Modules accepted: Orders

## 2013-09-17 NOTE — Patient Instructions (Signed)
Discussed orally 

## 2013-09-17 NOTE — Telephone Encounter (Signed)
OK to refill oxycodone 5-10 q6h PRN pain #50

## 2013-09-22 ENCOUNTER — Encounter (INDEPENDENT_AMBULATORY_CARE_PROVIDER_SITE_OTHER): Payer: Self-pay | Admitting: Surgery

## 2013-09-22 ENCOUNTER — Ambulatory Visit (INDEPENDENT_AMBULATORY_CARE_PROVIDER_SITE_OTHER): Payer: Commercial Indemnity | Admitting: Surgery

## 2013-09-22 VITALS — BP 126/82 | HR 72 | Resp 16 | Ht 66.0 in | Wt 154.4 lb

## 2013-09-22 DIAGNOSIS — K5909 Other constipation: Secondary | ICD-10-CM

## 2013-09-22 DIAGNOSIS — A048 Other specified bacterial intestinal infections: Secondary | ICD-10-CM

## 2013-09-22 DIAGNOSIS — F3289 Other specified depressive episodes: Secondary | ICD-10-CM

## 2013-09-22 DIAGNOSIS — B9681 Helicobacter pylori [H. pylori] as the cause of diseases classified elsewhere: Secondary | ICD-10-CM

## 2013-09-22 DIAGNOSIS — K5900 Constipation, unspecified: Secondary | ICD-10-CM

## 2013-09-22 DIAGNOSIS — K56609 Unspecified intestinal obstruction, unspecified as to partial versus complete obstruction: Secondary | ICD-10-CM

## 2013-09-22 DIAGNOSIS — K2980 Duodenitis without bleeding: Secondary | ICD-10-CM

## 2013-09-22 DIAGNOSIS — R1031 Right lower quadrant pain: Secondary | ICD-10-CM

## 2013-09-22 DIAGNOSIS — F329 Major depressive disorder, single episode, unspecified: Secondary | ICD-10-CM

## 2013-09-22 DIAGNOSIS — G894 Chronic pain syndrome: Secondary | ICD-10-CM

## 2013-09-22 MED ORDER — OXYCODONE HCL 5 MG PO TABS: 5.0000 mg | ORAL_TABLET | ORAL | Status: DC | PRN

## 2013-09-22 NOTE — Patient Instructions (Signed)
Switch to MiraLax only for her bowel regimen.  See if that helps control bloating/fluctuance.  Increase until you are having one soft bowel movement a day.  Continue using heat round-the-clock.  Use Tylenol extra strength 2 pills 3 times a day.  Reserving oxycodone for breakthrough pain.  Tran taper to off over the next 1-2 months.  If you are having one bowel movement a day, walking an hour a day, tapering off your narcotics and still struggling; then, may reconsider seeing Dr Rhea Belton with [facility] gastroenterology sooner.  Otherwise, you can probably wait until January for now.  GETTING TO GOOD BOWEL HEALTH. Irregular bowel habits such as constipation and diarrhea can lead to many problems over time.  Having one soft bowel movement a day is the most important way to prevent further problems.  The anorectal canal is designed to handle stretching and feces to safely manage our ability to get rid of solid waste (feces, poop, stool) out of our body.  BUT, hard constipated stools can act like ripping concrete bricks and diarrhea can be a burning fire to this very sensitive area of our body, causing inflamed hemorrhoids, anal fissures, increasing risk is perirectal abscesses, abdominal pain/bloating, an making irritable bowel worse.     The goal: ONE SOFT BOWEL MOVEMENT A DAY!  To have soft, regular bowel movements:    Drink at least plenty of water a day.     Bulking Agents -- This type of water-retaining fiber generally is easily obtained each day by one of the following:  o Polyethylene Glycol - and "artificial" fiber commonly called Miralax or Glycolax.  It is helpful for people with gassy or bloated feelings with regular fiber o Flax Seed - a less gassy fiber than psyllium   No reading or other relaxing activity while on the toilet. If bowel movements take longer than 5 minutes, you are too constipated   AVOID CONSTIPATION.  High fiber and water intake usually takes care of this.  Sometimes a laxative  is needed to stimulate more frequent bowel movements, but    Laxatives are not a good long-term solution as it can wear the colon out. o Osmotics (Milk of Magnesia, Fleets phosphosoda, Magnesium citrate, MiraLax, GoLytely) are safer than  o Stimulants (Senokot, Castor Oil, Dulcolax, Ex Lax)    o Do not take laxatives for more than 7days in a row.    IF SEVERELY CONSTIPATED, try a Bowel Retraining Program: o Do not use laxatives.  o Eat a diet high in roughage, such as bran cereals and leafy vegetables.  o Drink six (6) ounces of prune or apricot juice each morning.  o Eat two (2) large servings of stewed fruit each day.  o Take one (1) heaping tablespoon of a psyllium-based bulking agent twice a day. Use sugar-free sweetener when possible to avoid excessive calories.  o Eat a normal breakfast.  o Set aside 15 minutes after breakfast to sit on the toilet, but do not strain to have a bowel movement.  o If you do not have a bowel movement by the third day, use an enema and repeat the above steps.    Controlling diarrhea o Switch to liquids and simpler foods for a few days to avoid stressing your intestines further. o Avoid dairy products (especially milk & ice cream) for a short time.  The intestines often can lose the ability to digest lactose when stressed. o Avoid foods that cause gassiness or bloating.  Typical foods include beans  and other legumes, cabbage, broccoli, and dairy foods.  Every person has some sensitivity to other foods, so listen to our body and avoid those foods that trigger problems for you. o Adding fiber (Citrucel, Metamucil, psyllium, Miralax) gradually can help thicken stools by absorbing excess fluid and retrain the intestines to act more normally.  Slowly increase the dose over a few weeks.  Too much fiber too soon can backfire and cause cramping & bloating. o Probiotics (such as active yogurt, Align, etc) may help repopulate the intestines and colon with normal bacteria  and calm down a sensitive digestive tract.  Most studies show it to be of mild help, though, and such products can be costly. o Medicines:   Bismuth subsalicylate (ex. Kayopectate, Pepto Bismol) every 30 minutes for up to 6 doses can help control diarrhea.  Avoid if pregnant.   Loperamide (Immodium) can slow down diarrhea.  Start with two tablets (4mg  total) first and then try one tablet every 6 hours.  Avoid if you are having fevers or severe pain.  If you are not better or start feeling worse, stop all medicines and call your doctor for advice o Call your doctor if you are getting worse or not better.  Sometimes further testing (cultures, endoscopy, X-ray studies, bloodwork, etc) may be needed to help diagnose and treat the cause of the diarrhea.  Managing Pain  Pain after surgery or related to activity is often due to strain/injury to muscle, tendon, nerves and/or incisions.  This pain is usually short-term and will improve in a few months.   Many people find it helpful to do the following things TOGETHER to help speed the process of healing and to get back to regular activity more quickly:  1. Avoid heavy physical activity a. No lifting that causes pain b. Do not "push through" the pain.  Listen to your body and avoid positions and maneuvers than reproduce the pain c. Walking is okay as tolerated, but go slowly and stop when getting sore.  d. Remember: If it hurts to do it, then don't do it! 2. Take Anti-inflammatory medication  a. Take with food/snack around the clock for 1-2 weeks i. This helps the muscle and nerve tissues become less irritable and calm down faster ii. Choose Acetaminophen 500mg  tabs (Tylenol) 2 pills with every meal and just before bedtime 3. Use a Heating pad or Ice/Cold Pack a. 4-6 times a day b. May use warm bath/hottub  or showers 4. Try Gentle Massage and/or Stretching  a. at the area of pain many times a day b. stop if you feel pain - do not overdo it  Try  these steps together to help you body heal faster and avoid making things get worse.  Doing just one of these things may not be enough.    If you are not getting better after two weeks or are noticing you are getting worse, contact our office for further advice; we may need to re-evaluate you & see what other things we can do to help.  Narcotic Withdrawal When drug use interferes with normal living activities and relationships, it is abuse. Abuse includes problems with family and friends. Psychological dependence has developed when your mind tells you that the drug is needed. This is usually followed by a physical dependence in which you need more of the drug to get the same feeling or "high." This is known as addiction or chemical dependency. Risk is greater when chemical dependency exists in the family.  SYMPTOMS  When tolerance to narcotics has developed, stopping of the narcotic suddenly can cause uncomfortable physical symptoms. Most of the time these are mild and consist of shakes or jitters (tremors) in the hands,a rapid heart rate, rapid breathing, and temperature. Sometimes these symptoms are associated with anxiety, panic attacks, and bad dreams. Other symptoms include:  Irritability.  Anxiety.  Runny nose.  "Goose flesh."  Diarrhea.  Feeling sick to the stomach (nauseous).  Muscle spasms.  Sleeplessness.  Chills.  Sweats.  Drug cravings.  Confusion. The severity of the withdrawal is based on the individual and varies from person to person. Many people choose to continue using narcotics to get rid of the discomfort of withdrawal. They also use to try to feel normal. TREATMENT  Quitting an addiction means stopping use of all chemicals. This is hard but may save your life. With continual drug use, possible outcomes are often loss of self respect and esteem, violence, death, and eventually prison if the use of narcotics has led to the death of another. Addiction cannot be  cured, but it can be stopped. This often requires outside help and the care of professionals. Most hospitals and clinics can refer you to a specialized care center. It is not necessary for you to go through the uncomfortable symptoms of withdrawal. Your caregiver can provide you with medications that will help you through this difficult period. Try to avoid situations, friends, or alcohol, which may have made it possible for you to continue using narcotics in the past. Learn how to say no! HOME CARE INSTRUCTIONS   Drink fluids, get plenty of rest, and take hot baths.  Medicines may be prescribed to help control withdrawal symptoms.  Over-the-counter medicines may be helpful to control diarrhea or an upset stomach.  If your problems resulted from taking prescription pain medicines, make sure you have a follow-up visit with your caregiver within the next few days. Be open about this problem.  If you are dependent or addicted to street drugs, contact a local drug and alcohol treatment center or Narcotics Anonymous.  Have someone with you to monitor your symptoms.  Engage in healthy activities with friends who do not use drugs.  Stay away from the drug scene. It takes a long period of time to overcome addictions to all drugs. There may be times when you feel as though you want to use. Following loss of a physical addiction and going through withdrawal, you have conquered the most difficult part of getting rid of an addiction. Gradually, you will have a lessening of the craving that is telling you that you need narcotics to feel normal. Call your caregiver or a member of your support group if more support is needed. Learn who to talk to in your family and among your friends so that during these periods you can receive outside help. SEEK IMMEDIATE MEDICAL CARE IF:   You have vomiting that cannot be controlled, especially if you cannot keep liquids down.  You are seeing things or hearing voices that  are not really there (hallucinating).  You have a seizure. Document Released: 01/11/2003 Document Revised: 01/13/2012 Document Reviewed: 10/16/2008 [facility] Patient Information 2014 [place], [place].

## 2013-09-22 NOTE — Progress Notes (Signed)
Subjective:     Patient ID: Jane English, female   DOB: [birth year], 42 y.o.   MRN: [id number]  HPI  Jane English  03/12/1971 [id number]  Patient Care Team: Kerri Perches, MD as PCP - General Ardeth Sportsman, MD as Consulting Physician (General Surgery) Karn Cassis, MD as Consulting Physician (Neurosurgery) Beverley Fiedler, MD as Consulting Physician (Gastroenterology) Diannia Ruder, MD as Consulting Physician (Behavioral Health)  Procedure (Date: 07/29/2013):  POST-OPERATIVE DIAGNOSIS: recurrent abdominal pain,extensive adhesions   PROCEDURE: Procedure(s):  diagnostic laporoscopy  LYSIS OF ADHESIONS  serosal repair  SURGEON: Surgeon(s):  Ardeth Sportsman, MD  Kandis Cocking, MD - Asst  ANESTHESIA: local and general   Procedure (Date: 08/16/2013):  POST-OPERATIVE DIAGNOSIS: chronic abdominal pain, SMALL BOWEL OBSTRUCTION with intestinal mesentery inflammatory mass  PROCEDURE: Procedure(s):  LAPAROSCOPY DIAGNOSTIC  LYSIS OF ADHESIONS  SMALL BOWEL RESECTION (JEJUNUM)  SURGEON: Surgeon(s):  Ardeth Sportsman, MD  ASSISTANT: Chelsea Aus, PA student   Diagnosis Small intestine, resection, jejunum - SMALL BOWEL MUCOSA WITH EXTENSIVE SEROSAL ADHESIONS, NO EVIDENCE OF ACTIVE INFLAMMATION, ATYPIA OR MALIGNANCY. - RESECTION MARGINS VIABLE. Jane English LI MD Pathologist, Electronic Signature (Case signed 08/18/2013) Specimen Jane English and Clinical Information Specimen(s) Obtained: Small intestine, resection, jejunum Specimen Clinical Information Chronic abdominal pain adhesions (je) Jane English Received in formalin is a tortuous mass of small intestine with overall dimensions of 15 x 15 x 7 cm. The segment measures approximately 95 cm in length and the margins are patent. There are dense adhesions present between the loops of intestine and there are portions of indurated omentum adherent to the serosal surface. There are multiple points of extrinsic compression obstructing the lumen.  The mucosal surfaces are glistening, tan to hyperemic and there are no discrete mucosal masses. There is an additional separate portion of intestine consisting of a side-to-side anastomosis measuring 6 cm in length. Sections are submitted in five cassettes: A, B = margins of resection. C, D = sections to include adhesions. E = section from separate portion of intestine. (GRP:ecj 08/17/2013)  This patient returns for surgical re-evaluation with her husband.  She had extensive adhesions requiring prolonged laparoscopic lysis of adhesions.  This was complicated by early bowel obstruction that did not resolve required bowel resection.  She was discharged 09/01/2013.  She did not go to the emergency room.  She has been to behavioral health to do with her numerous depression/anxiety/PTSD issues.  She still complains of crampy abdominal pain.  Intermittent constipation and diarrhea.  However, a little more stable now.  She is using fiber supplements and MiraLax.  Alternates with bowel movement every other day to a couple a day.  Uncomfortable to have bowel movements.  Still a severe crampy abdominal pain.  Trying to stick to bland foods.  Spicy foods "tear me up".  Using Tylenol 500 mg every 4-6 hours.  Taking oxycodone 15 every 8 hours.  Oxycodone helps.  She continues to feel frustrated and depressed.  She is trying to get out of walk.  She does not like to go out in the cold weather.  No nausea or vomiting.  No fevers or chills.  No blood per rectum.  Felt something small fall off the incision ? small stitch/scab.  No problems there.  Soreness seems to be more focused in the lower abdomen.  Patient Active Problem List   Diagnosis Date Noted  . Chronic pain syndrome 10/06/2012    Priority: High  . Anxiety state, unspecified 08/30/2010  Priority: Medium  . DEPRESSION, CHRONIC 01/08/2009    Priority: Medium  . Diabetes 08/21/2013  . Palliative care encounter 08/14/2013  . Abdominal pain,  unspecified site 08/14/2013  . H. pylori duodenitis 07/07/2013  . Colicky RLQ abdominal pain, probable chronic PSBO 07/07/2013  . PTSD (post-traumatic stress disorder) 03/26/2013  . Chronic constipation 03/23/2013  . CNS disorder 03/22/2013  . Chronic migraine without aura 03/18/2013  . Insomnia 03/18/2013  . Abnormal weight loss 02/28/2013  . Nausea alone 02/09/2013  . Seasonal allergies 12/10/2012  . GERD (gastroesophageal reflux disease) 10/29/2012  . Postlaminectomy syndrome 10/06/2012  . Onychomycosis 09/02/2012  . Cervical neck pain with evidence of disc disease 04/13/2012  . Neck muscle spasm 12/31/2011  . ALLERGIC RHINITIS CAUSE UNSPECIFIED 04/17/2010  . Intrinsic asthma, unspecified 03/04/2010  . IGT (impaired glucose tolerance) 01/05/2009  . ESSENTIAL HYPERTENSION 11/27/2007    Past Medical History  Diagnosis Date  . Migraine headache   . Back pain   . Obesity   . Hypertension   . DVT (deep venous thrombosis) 2010  . Heart murmur     no cardiologist  . Asthma   . Bronchitis   . Depression   . Diabetes mellitus without complication   . Obsessive-compulsive disorder   . PTSD (post-traumatic stress disorder)   . Shortness of breath   . GERD (gastroesophageal reflux disease)   . Anemia   . 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 Asthma flare 04/09/2013  . Seasonal allergies 12/10/2012  . Chronic abdominal pain   . Chronic constipation   . Diabetes mellitus, type II   . SBO (small bowel obstruction) 08/09/2013    Past Surgical History  Procedure Laterality Date  . Appendectomy  1986  . Carpal tunnel release    . Tubal ligation  1994  . Lumbar spine surgery  2010    x2  . Back surgery    . Lysis of adhesion  2003    Dr. Elpidio Anis  . Trigger finger release  2009    right pinkie finger  . Anterior cervical decomp/discectomy fusion  07/07/2012    Procedure: ANTERIOR CERVICAL  DECOMPRESSION/DISCECTOMY FUSION 2 LEVELS;  Surgeon: Karn Cassis, MD;  Location: [facility];  Service: Neurosurgery;  Laterality: N/A;  Cervical four-five, five - six  Anterior cervical decompression/diskectomy/fusion/plate  . Partial hysterectomy  1990s?    [place]  . Spinal cord stimulator implant    . Oophorectomy    . Laparoscopy N/A 07/29/2013    Procedure: diagnostic laporoscopy;  Surgeon: Ardeth Sportsman, MD;  Location: [facility];  Service: General;  Laterality: N/A;  . Lysis of adhesion N/A 07/29/2013    Procedure: LYSIS OF ADHESION;  Surgeon: Ardeth Sportsman, MD;  Location: [facility];  Service: General;  Laterality: N/A;  . Bowel resection N/A 07/29/2013    Procedure: serosal repair;  Surgeon: Ardeth Sportsman, MD;  Location: [facility];  Service: General;  Laterality: N/A;  . Laparoscopy N/A 08/16/2013    Procedure: LAPAROSCOPY DIAGNOSTIC/LYSIS OF ADHESIONS;  Surgeon: Ardeth Sportsman, MD;  Location: [facility];  Service: General;  Laterality: N/A;  . Laparotomy N/A 08/16/2013    Procedure: EXPLORATORY LAPAROTOMY/SMALL BOWEL RESECTION (JEJUNUM);  Surgeon: Ardeth Sportsman, MD;  Location: [facility];  Service: General;  Laterality: N/A;    History   Social History  . Marital Status: Married    Spouse Name:  N/A    Number of Children: N/A  . Years of Education: N/A   Occupational History  . Not on file.   Social History Main Topics  . Smoking status: Never Smoker   . Smokeless tobacco: Never Used  . Alcohol Use: No  . Drug Use: No  . Sexual Activity: Yes   Other Topics Concern  . Not on file   Social History Narrative  . No narrative on file    Family History  Problem Relation Age of Onset  . Lung cancer Father   . Stomach cancer Father   . Cancer Father   . Alcohol abuse Father   . Mental illness Father   . Diabetes Sister   . Hypertension Sister   . Bipolar disorder Sister   . Schizophrenia Sister   . Diabetes Sister   . Alcohol abuse Brother   . Hypertension  Brother   . Kidney disease Brother   . Diabetes Brother   . Drug abuse Brother   . Mental illness Brother   . Alcohol abuse Brother   . Alcohol abuse Brother   . Hypertension Brother   . Diabetes Brother   . Alcohol abuse Brother   . Physical abuse Mother   . Alcohol abuse Mother   . Mental illness Brother   . ADD / ADHD Neg Hx   . Anxiety disorder Neg Hx   . Dementia Neg Hx   . Depression Neg Hx   . OCD Neg Hx   . Seizures Neg Hx   . Paranoid behavior Neg Hx   . Drug abuse Sister   . Alcohol abuse Brother     Current Outpatient Prescriptions  Medication Sig Dispense Refill  . amLODipine (NORVASC) 10 MG tablet Take 10 mg by mouth every morning.      . budesonide-formoterol (SYMBICORT) 160-4.5 MCG/ACT inhaler Inhale 2 puffs into the lungs 2 (two) times daily.      . Carbamazepine (EQUETRO) 300 MG CP12 Take 1 capsule (300 mg total) by mouth at bedtime.  60 each  2  . cetirizine (ZYRTEC) 10 MG tablet Take 10 mg by mouth daily.      Marland Kitchen ipratropium (ATROVENT) 0.02 % nebulizer solution Take 500 mcg by nebulization every 6 (six) hours as needed for wheezing.      . montelukast (SINGULAIR) 10 MG tablet TAKE 1 TABLET (10 MG TOTAL) BY MOUTH AT BEDTIME.  30 tablet  0  . oxyCODONE (OXY IR/ROXICODONE) 5 MG immediate release tablet Take 1-2 tablets (5-10 mg total) by mouth every 4 (four) hours as needed.  50 tablet  0  . ALPRAZolam (XANAX) 1 MG tablet Take 1 tablet (1 mg total) by mouth 2 (two) times daily.  60 tablet  2  . fluticasone (FLONASE) 50 MCG/ACT nasal spray Place 2 sprays into the nose daily.       No current facility-administered medications for this visit.     Allergies  Allergen Reactions  . Neurontin [Gabapentin] Other (See Comments)    Other reaction(s): Mental Status Changes (intolerance) hallucinations Sleep walking  . Amoxicillin Hives  . Penicillins Hives  . Latex Rash    BP 126/82  Pulse 72  Resp 16  Ht 5\' 6"  (1.676 m)  Wt 154 lb 6.4 oz (70.035 kg)  BMI  24.93 kg/m2  No results found.   Review of Systems  Constitutional: Negative for fever, chills and diaphoresis.  HENT: Negative for ear pain, sore throat and trouble swallowing.   Eyes:  Negative for photophobia and visual disturbance.  Respiratory: Negative for cough and choking.   Cardiovascular: Negative for chest pain and palpitations.  Gastrointestinal: Positive for abdominal pain. Negative for nausea, vomiting, diarrhea, constipation, blood in stool, anal bleeding and rectal pain.  Genitourinary: Negative for dysuria, urgency, frequency, decreased urine volume and difficulty urinating.  Musculoskeletal: Positive for back pain and myalgias. Negative for gait problem.  Skin: Negative for color change, pallor and rash.  Neurological: Negative for dizziness, speech difficulty, weakness and numbness.  Hematological: Negative for adenopathy.  Psychiatric/Behavioral: Positive for sleep disturbance and dysphoric mood. Negative for suicidal ideas, hallucinations, confusion and agitation. The patient is not nervous/anxious and is not hyperactive.        Objective:   Physical Exam  Constitutional: She is oriented to person, place, and time. She appears well-developed and well-nourished. No distress.  HENT:  Head: Normocephalic.  Mouth/Throat: Oropharynx is clear and moist. No oropharyngeal exudate.  Eyes: Conjunctivae and EOM are normal. Pupils are equal, round, and reactive to light. No scleral icterus.  Neck: Normal range of motion. No tracheal deviation present.  Cardiovascular: Normal rate and intact distal pulses.   Pulmonary/Chest: Effort normal. No respiratory distress. She exhibits no tenderness.  Abdominal: Soft. She exhibits no distension. There is no tenderness. Hernia confirmed negative in the right inguinal area and confirmed negative in the left inguinal area.  Incisions clean with normal healing ridges.  No hernias  Genitourinary: No vaginal discharge found.   Musculoskeletal: Normal range of motion. She exhibits no tenderness.  Lymphadenopathy:       Right: No inguinal adenopathy present.       Left: No inguinal adenopathy present.  Neurological: She is alert and oriented to person, place, and time. No cranial nerve deficit. She exhibits normal muscle tone. Coordination normal.  Skin: Skin is warm and dry. No rash noted. She is not diaphoretic.  Psychiatric: Her behavior is normal. Judgment and thought content normal. Her mood appears not anxious. Her affect is not angry, not blunt and not labile. Her speech is not delayed and not slurred. She is not agitated, not aggressive, not slowed, not withdrawn and not actively hallucinating. Cognition and memory are normal. She exhibits a depressed mood.       Assessment:     Stabilizing status post 2 operations for chronic adhesions and chronic abdominal pain.     Plan:     I worked to to focus on some positives.  She is using less narcotics.  She did not end up being readmitted.  She is walking more.  She is tolerating food better.  No episodes of nausea/vomiting.  She wished for some more oxycodone.  Renewed 50.  I will be okay with renewing narcotics for the first 90 days.  Then, we will need to defer to her primary care physician/counseling MDs to help with a better long-term plan.  Again strongly recommend she continue heat round-the-clock.  Increase Tylenol to 1 g 3 times a day.  Reserve oxycodone for breakthrough pain.  I noted that until she can get off the narcotics, she will still struggle with abdominal cramping and constipation.  I am skeptical she will completely come off of it, but she claims she is motivated.  Will work to continue to taper it down.  Anxiety/PTSD/depression a major contributor to her fair ability to cope with her operations and prolonged hospital course.  Continue psycho therapy/counseling.  I recommend she switch to MiraLax only.  Regular fiber may be  causing too much  bloating/flatulence.  Consider switch to do flaxseed if this does not work.  Diet as tolerated.  Low fat high fiber diet ideal.   At some point, she will benefit from seeing gastroenterology to see if there is an IBS/cramping component to all this.  I would wait until about 3 months out before introducing new medications.  Increase activity as tolerated to regular activity.  Low impact exercise such as walking an hour a day at least ideal.  Do not push through pain.  Walk in warm area such as the wall for [place].  Return to clinic 1 month, sooner as  needed.   Instructions discussed.  Followup with primary care physician for other health issues as would normally be done.  Questions answered.  The patient expressed understanding and appreciation

## 2013-09-24 ENCOUNTER — Ambulatory Visit: Payer: Self-pay | Admitting: Internal Medicine

## 2013-10-02 ENCOUNTER — Other Ambulatory Visit: Payer: Self-pay | Admitting: Internal Medicine

## 2013-10-02 ENCOUNTER — Other Ambulatory Visit: Payer: Self-pay | Admitting: Family Medicine

## 2013-10-04 ENCOUNTER — Telehealth (INDEPENDENT_AMBULATORY_CARE_PROVIDER_SITE_OTHER): Payer: Self-pay

## 2013-10-04 DIAGNOSIS — K56609 Unspecified intestinal obstruction, unspecified as to partial versus complete obstruction: Secondary | ICD-10-CM

## 2013-10-04 MED ORDER — OXYCODONE HCL 5 MG PO TABS: 5.0000 mg | ORAL_TABLET | ORAL | Status: DC | PRN

## 2013-10-04 NOTE — Telephone Encounter (Signed)
Pt calling in asking for refill of pain medications (oxycodone 5mg ).  States that stomach is still hurting.  Denies n/v/f/.  Pt asking if she can get a call "earlier than closing time" since she lives in [place].  Let pt know that I would send message to Dr. Michaell Cowing and Elease Hashimoto and they will get back to her as soon as they can.  Pt verbalized understanding at this time

## 2013-10-04 NOTE — Telephone Encounter (Signed)
Returned pt's call and spoke to the husband. I notified him that I got Dr Dwain Sarna to write a rx for Oxycodone 5mg  #50 which is ready for p/u at the front desk. I made copy for chart.

## 2013-10-04 NOTE — Addendum Note (Signed)
Addended by: Ethlyn Gallery on: 10/04/2013 11:13 AM   Modules accepted: Orders

## 2013-10-07 ENCOUNTER — Ambulatory Visit (INDEPENDENT_AMBULATORY_CARE_PROVIDER_SITE_OTHER): Payer: Managed Care, Other (non HMO) | Admitting: Psychiatry

## 2013-10-07 ENCOUNTER — Encounter (HOSPITAL_COMMUNITY): Payer: Self-pay | Admitting: Psychiatry

## 2013-10-07 VITALS — BP 140/82 | Ht 66.0 in | Wt 157.0 lb

## 2013-10-07 DIAGNOSIS — F319 Bipolar disorder, unspecified: Secondary | ICD-10-CM

## 2013-10-07 MED ORDER — ALPRAZOLAM 1 MG PO TABS: 1.0000 mg | ORAL_TABLET | Freq: Two times a day (BID) | ORAL | Status: DC
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
## 2013-10-08 ENCOUNTER — Ambulatory Visit (INDEPENDENT_AMBULATORY_CARE_PROVIDER_SITE_OTHER): Payer: Managed Care, Other (non HMO) | Admitting: Psychiatry

## 2013-10-08 DIAGNOSIS — F319 Bipolar disorder, unspecified: Secondary | ICD-10-CM

## 2013-10-08 DIAGNOSIS — F3189 Other bipolar disorder: Secondary | ICD-10-CM
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
## 2013-10-08 NOTE — Patient Instructions (Signed)
Discussed orally 

## 2013-10-11 ENCOUNTER — Encounter: Payer: Self-pay | Admitting: Family Medicine

## 2013-10-11 ENCOUNTER — Telehealth: Payer: Self-pay

## 2013-10-11 ENCOUNTER — Ambulatory Visit (INDEPENDENT_AMBULATORY_CARE_PROVIDER_SITE_OTHER): Payer: Managed Care, Other (non HMO) | Admitting: Family Medicine

## 2013-10-11 VITALS — BP 126/82 | HR 98 | Resp 18 | Ht 65.25 in | Wt 155.1 lb

## 2013-10-11 DIAGNOSIS — E119 Type 2 diabetes mellitus without complications: Secondary | ICD-10-CM

## 2013-10-11 DIAGNOSIS — N3000 Acute cystitis without hematuria: Secondary | ICD-10-CM

## 2013-10-11 DIAGNOSIS — R7302 Impaired glucose tolerance (oral): Secondary | ICD-10-CM

## 2013-10-11 DIAGNOSIS — I10 Essential (primary) hypertension: Secondary | ICD-10-CM

## 2013-10-11 DIAGNOSIS — F3289 Other specified depressive episodes: Secondary | ICD-10-CM

## 2013-10-11 DIAGNOSIS — R7309 Other abnormal glucose: Secondary | ICD-10-CM

## 2013-10-11 DIAGNOSIS — F329 Major depressive disorder, single episode, unspecified: Secondary | ICD-10-CM

## 2013-10-11 DIAGNOSIS — E876 Hypokalemia: Secondary | ICD-10-CM

## 2013-10-11 DIAGNOSIS — D649 Anemia, unspecified: Secondary | ICD-10-CM | POA: Insufficient documentation

## 2013-10-11 DIAGNOSIS — J45909 Unspecified asthma, uncomplicated: Secondary | ICD-10-CM

## 2013-10-11 LAB — POCT URINALYSIS DIPSTICK
Bilirubin, UA: NEGATIVE
Ketones, UA: NEGATIVE
Nitrite, UA: NEGATIVE
pH, UA: 6

## 2013-10-11 LAB — CBC
MCH: 27.7 pg (ref 26.0–34.0)
MCV: 83.8 fL (ref 78.0–100.0)
Platelets: 347 10*3/uL (ref 150–400)
RBC: 3.64 MIL/uL — ABNORMAL LOW (ref 3.87–5.11)
RDW: 14.1 % (ref 11.5–15.5)

## 2013-10-11 MED ORDER — FLUCONAZOLE 150 MG PO TABS: 150.0000 mg | ORAL_TABLET | Freq: Once | ORAL | Status: AC

## 2013-10-11 MED ORDER — LEVOFLOXACIN 500 MG PO TABS: 500.0000 mg | ORAL_TABLET | Freq: Every day | ORAL | Status: DC

## 2013-10-11 MED ORDER — POLYSACCHARIDE IRON COMPLEX 150 MG PO CAPS: 150.0000 mg | ORAL_CAPSULE | Freq: Two times a day (BID) | ORAL | Status: DC

## 2013-10-11 MED ORDER — PROMETHAZINE-DM 6.25-15 MG/5ML PO SYRP: ORAL_SOLUTION | ORAL | Status: DC
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
## 2013-10-11 NOTE — Patient Instructions (Addendum)
F/u in 6 to 8 weeks, call if you need me before  You are being treated for presumed UTI, levaquin prescribed for 5 days and also fluconazole for use if needed, for vaginal itch due to yeast. Cough suppressant syrup for bedtime use is prescribed You need to increase water intake to 64 ounces daily, today you are dehydrated   Niferex (iron ) is prescribed  Lab todaychem 7, cbc iron and ferritin today

## 2013-10-11 NOTE — Telephone Encounter (Signed)
Will work in patient this evening

## 2013-10-12 DIAGNOSIS — E876 Hypokalemia: Secondary | ICD-10-CM | POA: Insufficient documentation

## 2013-10-12 LAB — FERRITIN: Ferritin: 12 ng/mL (ref 10–291)

## 2013-10-12 LAB — BASIC METABOLIC PANEL
CO2: 27 mEq/L (ref 19–32)
Calcium: 9 mg/dL (ref 8.4–10.5)
Creat: 0.77 mg/dL (ref 0.50–1.10)
Glucose, Bld: 80 mg/dL (ref 70–99)
Potassium: 3.3 mEq/L — ABNORMAL LOW (ref 3.5–5.3)

## 2013-10-12 LAB — IRON: Iron: 15 ug/dL — ABNORMAL LOW (ref 42–145)

## 2013-10-12 MED ORDER — POTASSIUM CHLORIDE ER 10 MEQ PO TBCR: 10.0000 meq | EXTENDED_RELEASE_TABLET | Freq: Three times a day (TID) | ORAL | Status: DC

## 2013-10-14 LAB — URINE CULTURE: Colony Count: >=100000

## 2013-10-15 ENCOUNTER — Telehealth (INDEPENDENT_AMBULATORY_CARE_PROVIDER_SITE_OTHER): Payer: Medicare Other | Admitting: Family Medicine

## 2013-10-15 ENCOUNTER — Other Ambulatory Visit: Payer: Self-pay | Admitting: Family Medicine

## 2013-10-15 DIAGNOSIS — R05 Cough: Secondary | ICD-10-CM

## 2013-10-15 DIAGNOSIS — R059 Cough, unspecified: Secondary | ICD-10-CM

## 2013-10-15 MED ORDER — BENZONATATE 100 MG PO CAPS: 100.0000 mg | ORAL_CAPSULE | Freq: Four times a day (QID) | ORAL | Status: DC | PRN

## 2013-10-15 MED ORDER — BUTALBITAL-APAP-CAFFEINE 50-325-40 MG PO TABS: ORAL_TABLET | ORAL | Status: DC

## 2013-10-15 NOTE — Telephone Encounter (Signed)
Patient aware.  She is still asking if anything else can be given for headache due to it being ineffective.

## 2013-10-15 NOTE — Telephone Encounter (Signed)
Please advise.  Levaquin x 5 days given at last visit.

## 2013-10-15 NOTE — Telephone Encounter (Signed)
Tessalon perles sent as decongestant, advise tylenol for headache

## 2013-10-15 NOTE — Telephone Encounter (Signed)
fioricet prescribed, pls fax and let her know

## 2013-10-18 ENCOUNTER — Ambulatory Visit (HOSPITAL_COMMUNITY)
Admission: RE | Admit: 2013-10-18 | Discharge: 2013-10-18 | Disposition: A | Discharge status: Home / Self Care | Payer: Managed Care, Other (non HMO) | Source: Ambulatory Visit | Attending: Family Medicine | Admitting: Family Medicine

## 2013-10-18 ENCOUNTER — Telehealth: Payer: Self-pay | Admitting: Family Medicine

## 2013-10-18 ENCOUNTER — Encounter (INDEPENDENT_AMBULATORY_CARE_PROVIDER_SITE_OTHER): Payer: Self-pay | Admitting: Surgery

## 2013-10-18 ENCOUNTER — Ambulatory Visit (INDEPENDENT_AMBULATORY_CARE_PROVIDER_SITE_OTHER): Payer: Commercial Indemnity | Admitting: Surgery

## 2013-10-18 ENCOUNTER — Telehealth: Payer: Self-pay

## 2013-10-18 VITALS — BP 130/78 | HR 80 | Temp 97.5°F | Resp 18 | Ht 66.0 in | Wt 153.4 lb

## 2013-10-18 DIAGNOSIS — R059 Cough, unspecified: Secondary | ICD-10-CM | POA: Insufficient documentation

## 2013-10-18 DIAGNOSIS — R1031 Right lower quadrant pain: Secondary | ICD-10-CM

## 2013-10-18 DIAGNOSIS — K5909 Other constipation: Secondary | ICD-10-CM

## 2013-10-18 DIAGNOSIS — G894 Chronic pain syndrome: Secondary | ICD-10-CM

## 2013-10-18 DIAGNOSIS — N3000 Acute cystitis without hematuria: Secondary | ICD-10-CM

## 2013-10-18 DIAGNOSIS — R05 Cough: Secondary | ICD-10-CM

## 2013-10-18 DIAGNOSIS — K5900 Constipation, unspecified: Secondary | ICD-10-CM

## 2013-10-18 DIAGNOSIS — R141 Gas pain: Secondary | ICD-10-CM

## 2013-10-18 DIAGNOSIS — R143 Flatulence: Secondary | ICD-10-CM | POA: Insufficient documentation

## 2013-10-18 DIAGNOSIS — R0989 Other specified symptoms and signs involving the circulatory and respiratory systems: Secondary | ICD-10-CM | POA: Insufficient documentation

## 2013-10-18 MED ORDER — LEVOFLOXACIN 500 MG PO TABS: 500.0000 mg | ORAL_TABLET | Freq: Every day | ORAL | Status: AC

## 2013-10-18 MED ORDER — PREDNISONE 5 MG PO KIT: PACK | ORAL | Status: DC

## 2013-10-18 NOTE — Telephone Encounter (Signed)
Pls advise and order CXR, sputum c/s and she can resubmit urine if desired. Give and additional 5 days of levaquin in other words refill recently filled script and let her know Pls send in prednisone 5mg  dose pack #21 for chest congestion and cough also. (I will enter the CXR)

## 2013-10-18 NOTE — Addendum Note (Signed)
Addended by: Kandis Fantasia B on: 10/18/2013 04:43 PM   Modules accepted: Orders

## 2013-10-18 NOTE — Progress Notes (Signed)
Subjective:     Patient ID: Jane English, female   DOB: Dec 02, 1970, 42 y.o.   MRN: [id number]  HPI   Jane English  [birth year] [id number]  Patient Care Team: Jane Perches, MD as PCP - General Jane Sportsman, MD as Consulting Physician (General Surgery) Jane Cassis, MD as Consulting Physician (Neurosurgery) Jane Fiedler, MD as Consulting Physician (Gastroenterology) Jane Ruder, MD as Consulting Physician (Behavioral Health)  Procedure (Date: 07/29/2013):  POST-OPERATIVE DIAGNOSIS: recurrent abdominal pain,extensive adhesions   PROCEDURE: Procedure(s):  diagnostic laporoscopy  LYSIS OF ADHESIONS  serosal repair  SURGEON: Surgeon(s):  Jane Sportsman, MD  Jane Cocking, MD - Asst  ANESTHESIA: local and general   Procedure (Date: 08/16/2013):  POST-OPERATIVE DIAGNOSIS: chronic abdominal pain, SMALL BOWEL OBSTRUCTION with intestinal mesentery inflammatory mass  PROCEDURE: Procedure(s):  LAPAROSCOPY DIAGNOSTIC  LYSIS OF ADHESIONS  SMALL BOWEL RESECTION (JEJUNUM)  SURGEON: Surgeon(s):  Jane Sportsman, MD  ASSISTANT: Jane Aus, PA student   Diagnosis Small intestine, resection, jejunum - SMALL BOWEL MUCOSA WITH EXTENSIVE SEROSAL ADHESIONS, NO EVIDENCE OF ACTIVE INFLAMMATION, ATYPIA OR MALIGNANCY. - RESECTION MARGINS VIABLE. Jane English LI MD Pathologist, Electronic Signature (Case signed 08/18/2013) Specimen Jane English and Clinical Information Specimen(s) Obtained: Small intestine, resection, jejunum Specimen Clinical Information Chronic abdominal pain adhesions (je) Jane English Received in formalin is a tortuous mass of small intestine with overall dimensions of 15 x 15 x 7 cm. The segment measures approximately 95 cm in length and the margins are patent. There are dense adhesions present between the loops of intestine and there are portions of indurated omentum adherent to the serosal surface. There are multiple points of extrinsic compression obstructing the  lumen. The mucosal surfaces are glistening, tan to hyperemic and there are no discrete mucosal masses. There is an additional separate portion of intestine consisting of a side-to-side anastomosis measuring 6 cm in length. Sections are submitted in five cassettes: A, B = margins of resection. C, D = sections to include adhesions. E = section from separate portion of intestine. (GRP:ecj 08/17/2013)  This patient returns for surgical re-evaluation with her husband.  She had extensive adhesions requiring prolonged laparoscopic lysis of adhesions.  This was complicated by early bowel obstruction that did not resolve required bowel resection.  She was discharged 09/01/2013.  She has not needed to go to the emergency room.  She has been to behavioral health to do with her numerous depression/anxiety/PTSD issues.  She notes that the abdominal pain is more intermittent now.  More focused in the left lower quadrant.  Bothersome when she lies on her side.  Goes away when she lies on her back.  She has a fair amount of hyper flatulence on the MiraLax.  She was laughing about it, embarrassed.  Bowel movements twice a day.  Much more regular.  No nausea or vomiting.  No fevers or chills.  No blood per rectum.   Patient Active Problem List   Diagnosis Date Noted  . Chronic pain syndrome 10/06/2012    Priority: High  . Anxiety state, unspecified 08/30/2010    Priority: Medium  . DEPRESSION, CHRONIC 01/08/2009    Priority: Medium  . Cough 10/18/2013  . Hypokalemia 10/12/2013  . Acute cystitis 10/11/2013  . Anemia 10/11/2013  . Diabetes 08/21/2013  . Palliative care encounter 08/14/2013  . H. pylori duodenitis 07/07/2013  . Colicky RLQ abdominal pain, probable chronic PSBO 07/07/2013  . PTSD (post-traumatic stress disorder) 03/26/2013  . Chronic constipation 03/23/2013  .  CNS disorder 03/22/2013  . Chronic migraine without aura 03/18/2013  . Insomnia 03/18/2013  . Seasonal allergies 12/10/2012  .  GERD (gastroesophageal reflux disease) 10/29/2012  . Postlaminectomy syndrome 10/06/2012  . Onychomycosis 09/02/2012  . Cervical neck pain with evidence of disc disease 04/13/2012  . Neck muscle spasm 12/31/2011  . ALLERGIC RHINITIS CAUSE UNSPECIFIED 04/17/2010  . Intrinsic asthma, unspecified 03/04/2010  . IGT (impaired glucose tolerance) 01/05/2009  . ESSENTIAL HYPERTENSION 11/27/2007    Past Medical History  Diagnosis Date  . Migraine headache   . Back pain   . Obesity   . Hypertension   . DVT (deep venous thrombosis) 2010  . Heart murmur     no cardiologist  . Asthma   . Bronchitis   . Depression   . Diabetes mellitus without complication   . Obsessive-compulsive disorder   . PTSD (post-traumatic stress disorder)   . Shortness of breath   . GERD (gastroesophageal reflux disease)   . Anemia   . 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 Asthma flare 04/09/2013  . Seasonal allergies 12/10/2012  . Chronic abdominal pain   . Chronic constipation   . Diabetes mellitus, type II   . SBO (small bowel obstruction) 08/09/2013    Past Surgical History  Procedure Laterality Date  . Appendectomy  1986  . Carpal tunnel release    . Tubal ligation  1994  . Lumbar spine surgery  2010    x2  . Back surgery    . Lysis of adhesion  2003    Dr. Elpidio Anis  . Trigger finger release  2009    right pinkie finger  . Anterior cervical decomp/discectomy fusion  07/07/2012    Procedure: ANTERIOR CERVICAL DECOMPRESSION/DISCECTOMY FUSION 2 LEVELS;  Surgeon: Jane Cassis, MD;  Location: [facility];  Service: Neurosurgery;  Laterality: N/A;  Cervical four-five, five - six  Anterior cervical decompression/diskectomy/fusion/plate  . Partial hysterectomy  1990s?    [place]  . Spinal cord stimulator implant    . Oophorectomy    . Laparoscopy N/A 07/29/2013    Procedure: diagnostic laporoscopy;  Surgeon: Jane Sportsman, MD;  Location: [facility];  Service: General;  Laterality: N/A;  . Lysis of adhesion N/A 07/29/2013    Procedure: LYSIS OF ADHESION;  Surgeon: Jane Sportsman, MD;  Location: [facility];  Service: General;  Laterality: N/A;  . Bowel resection N/A 07/29/2013    Procedure: serosal repair;  Surgeon: Jane Sportsman, MD;  Location: [facility];  Service: General;  Laterality: N/A;  . Laparoscopy N/A 08/16/2013    Procedure: LAPAROSCOPY DIAGNOSTIC/LYSIS OF ADHESIONS;  Surgeon: Jane Sportsman, MD;  Location: [facility];  Service: General;  Laterality: N/A;  . Laparotomy N/A 08/16/2013    Procedure: EXPLORATORY LAPAROTOMY/SMALL BOWEL RESECTION (JEJUNUM);  Surgeon: Jane Sportsman, MD;  Location: [facility];  Service: General;  Laterality: N/A;    History   Social History  . Marital Status: Married    Spouse Name: N/A    Number of Children: N/A  . Years of Education: N/A   Occupational History  . Not on file.   Social History Main Topics  . Smoking status: Never Smoker   . Smokeless tobacco: Never Used  . Alcohol Use: No  . Drug Use: No  . Sexual Activity: Yes   Other Topics Concern  . Not on file   Social  History Narrative  . No narrative on file    Family History  Problem Relation Age of Onset  . Lung cancer Father   . Stomach cancer Father   . Cancer Father     esophageal  . Alcohol abuse Father   . Mental illness Father   . Diabetes Sister   . Hypertension Sister   . Bipolar disorder Sister   . Schizophrenia Sister   . Diabetes Sister   . Alcohol abuse Brother   . Hypertension Brother   . Kidney disease Brother   . Diabetes Brother   . Drug abuse Brother   . Mental illness Brother   . Alcohol abuse Brother   . Alcohol abuse Brother   . Hypertension Brother   . Diabetes Brother   . Alcohol abuse Brother   . Physical abuse Mother   . Alcohol abuse Mother   . Mental illness Brother   . ADD / ADHD Neg Hx   . Anxiety disorder Neg Hx   . Dementia Neg Hx   . Depression Neg Hx    . OCD Neg Hx   . Seizures Neg Hx   . Paranoid behavior Neg Hx   . Drug abuse Sister   . Alcohol abuse Brother     Current Outpatient Prescriptions  Medication Sig Dispense Refill  . ALPRAZolam (XANAX) 1 MG tablet Take 1 tablet (1 mg total) by mouth 2 (two) times daily.  60 tablet  2  . AMITIZA 24 MCG capsule TAKE ONE CAPSULE BY MOUTH TWICE A DAY WITH MEALS  60 capsule  3  . amLODipine (NORVASC) 10 MG tablet Take 10 mg by mouth every morning.      . benzonatate (TESSALON PERLES) 100 MG capsule Take 1 capsule (100 mg total) by mouth every 6 (six) hours as needed for cough.  30 capsule  0  . budesonide-formoterol (SYMBICORT) 160-4.5 MCG/ACT inhaler Inhale 2 puffs into the lungs 2 (two) times daily.      . Carbamazepine (EQUETRO) 300 MG CP12 Take 1 capsule (300 mg total) by mouth at bedtime.  60 each  2  . cetirizine (ZYRTEC) 10 MG tablet Take 10 mg by mouth daily.      . fentaNYL (DURAGESIC) 25 MCG/HR patch 1 patch.      . fluticasone (FLONASE) 50 MCG/ACT nasal spray Place 2 sprays into the nose daily.      Marland Kitchen ipratropium (ATROVENT) 0.02 % nebulizer solution Take 500 mcg by nebulization every 6 (six) hours as needed for wheezing.      . iron polysaccharides (NIFEREX) 150 MG capsule Take 1 capsule (150 mg total) by mouth 2 (two) times daily.  60 capsule  4  . levofloxacin (LEVAQUIN) 500 MG tablet Take 1 tablet (500 mg total) by mouth daily.  5 tablet  0  . montelukast (SINGULAIR) 10 MG tablet TAKE 1 TABLET (10 MG TOTAL) BY MOUTH AT BEDTIME.  30 tablet  3  . potassium chloride (K-DUR) 10 MEQ tablet Take 1 tablet (10 mEq total) by mouth 3 (three) times daily.  90 tablet  5  . PredniSONE 5 MG KIT Use as directed  21 each  0  . topiramate (TOPAMAX) 100 MG tablet TAKE 1 TABLET TWICE A DAY  60 tablet  3   No current facility-administered medications for this visit.     Allergies  Allergen Reactions  . Neurontin [Gabapentin] Other (See Comments)    Other reaction(s): Mental Status Changes  (intolerance) hallucinations Sleep walking  .  Amoxicillin Hives  . Penicillins Hives  . Latex Rash    BP 130/78  Pulse 80  Temp(Src) 97.5 F (36.4 C) (Temporal)  Resp 18  Ht 5\' 6"  (1.676 m)  Wt 153 lb 6.4 oz (69.582 kg)  BMI 24.77 kg/m2  No results found.   Review of Systems  Constitutional: Negative for fever, chills and diaphoresis.  HENT: Negative for ear pain, sore throat and trouble swallowing.   Eyes: Negative for photophobia and visual disturbance.  Respiratory: Negative for cough and choking.   Cardiovascular: Negative for chest pain and palpitations.  Gastrointestinal: Positive for abdominal pain. Negative for nausea, vomiting, diarrhea, constipation, blood in stool, anal bleeding and rectal pain.  Genitourinary: Negative for dysuria, urgency, frequency, decreased urine volume and difficulty urinating.  Musculoskeletal: Negative for gait problem.  Skin: Negative for color change, pallor and rash.  Neurological: Negative for dizziness, speech difficulty, weakness and numbness.  Hematological: Negative for adenopathy.  Psychiatric/Behavioral: Negative for suicidal ideas, hallucinations, confusion and agitation. The patient is not nervous/anxious and is not hyperactive.        Objective:   Physical Exam  Constitutional: She is oriented to person, place, and time. She appears well-developed and well-nourished. No distress.  HENT:  Head: Normocephalic.  Mouth/Throat: Oropharynx is clear and moist. No oropharyngeal exudate.  Eyes: Conjunctivae and EOM are normal. Pupils are equal, round, and reactive to light. No scleral icterus.  Neck: Normal range of motion. No tracheal deviation present.  Cardiovascular: Normal rate and intact distal pulses.   Pulmonary/Chest: Effort normal. No respiratory distress. She exhibits no tenderness.  Abdominal: Soft. She exhibits no distension. There is no tenderness. Hernia confirmed negative in the right inguinal area and confirmed  negative in the left inguinal area.  Incisions clean with normal healing ridges.  No hernias  Genitourinary: No vaginal discharge found.  Musculoskeletal: Normal range of motion. She exhibits no tenderness.  Lymphadenopathy:       Right: No inguinal adenopathy present.       Left: No inguinal adenopathy present.  Neurological: She is alert and oriented to person, place, and time. No cranial nerve deficit. She exhibits normal muscle tone. Coordination normal.  Skin: Skin is warm and dry. No rash noted. She is not diaphoretic.  Psychiatric: Her speech is normal and behavior is normal. Judgment and thought content normal. Her mood appears not anxious. Her affect is not angry, not blunt and not labile. She is not agitated, not aggressive, not slowed, not withdrawn and not actively hallucinating. Cognition and memory are normal. She does not exhibit a depressed mood.  Calm.  Tends to look at her more prone, connecting with her husband.  Not tearful.  Crying.  Actually laughing occasionally.       Assessment:     Markedly improved status post 2 operations for chronic adhesions and chronic abdominal pain.     Plan:     I worked to to focus on some positives.  She is using less narcotics.  She did not end up being readmitted.  She is walking more.  She is tolerating food better.  No episodes of nausea/vomiting.  Abdominal pain was diffuse in no more intermittent.  Strategies are helping it to become less now.  She did not request for more narcotics.  I held off and offering it.  I noted that until she can get off the narcotics, she will still struggle with abdominal cramping and constipation.  I am skeptical she will completely come off  of it, but she claims she is motivated.  Will work to continue to taper it down.  Anxiety/PTSD/depression a major contributor to her fair ability to cope with her operations and prolonged hospital course.  Continue psycho therapy/counseling.  She seems to be in a much  better place now, a major factor in her markedly improved coping mechanisms.  I recommend she switch to Flaxseed to help decrease hyper flatulence.  Diet as tolerated.  Low fat high fiber diet ideal.   Increase activity as tolerated to regular activity.  Low impact exercise such as walking an hour a day at least ideal.  Do not push through pain.  Walk in warm area such as the [employer]/gym.  Return to clinic as needed.   At some point, she will benefit from seeing gastroenterology to see if there is an IBS/cramping component to all this.  I would wait until about 3 months out before introducing new medications.Instructions discussed.  Followup with primary care physician for other health issues as would normally be done.  Questions answered.  The patient expressed understanding and appreciation

## 2013-10-18 NOTE — Telephone Encounter (Signed)
No available new till mid January or after, ok then

## 2013-10-18 NOTE — Telephone Encounter (Signed)
Called and spoke with patient.  She is aware of orders.  She will stop by office to submit sputum and urine.  Rx sent to [facility] in [place]

## 2013-10-18 NOTE — Addendum Note (Signed)
Addended by: Kandis Fantasia B on: 10/18/2013 12:44 PM   Modules accepted: Orders

## 2013-10-18 NOTE — Telephone Encounter (Signed)
Patient aware and in to submit specimens

## 2013-10-18 NOTE — Telephone Encounter (Signed)
Patient aware.

## 2013-10-18 NOTE — Patient Instructions (Signed)
GETTING TO GOOD BOWEL HEALTH. Irregular bowel habits such as constipation and diarrhea can lead to many problems over time.  Having one soft bowel movement a day is the most important way to prevent further problems.  The anorectal canal is designed to handle stretching and feces to safely manage our ability to get rid of solid waste (feces, poop, stool) out of our body.  BUT, hard constipated stools can act like ripping concrete bricks and diarrhea can be a burning fire to this very sensitive area of our body, causing inflamed hemorrhoids, anal fissures, increasing risk is perirectal abscesses, abdominal pain/bloating, an making irritable bowel worse.     The goal: ONE SOFT BOWEL MOVEMENT A DAY!  To have soft, regular bowel movements:    Drink at least 8 tall glasses of water a day.     Take plenty of fiber.  Fiber is the undigested part of plant food that passes into the colon, acting s "natures broom" to encourage bowel motility and movement.  Fiber can absorb and hold large amounts of water. This results in a larger, bulkier stool, which is soft and easier to pass. Work gradually over several weeks up to 6 servings a day of fiber (25g a day even more if needed) in the form of: o Vegetables -- Root (potatoes, carrots, turnips), leafy green (lettuce, salad greens, celery, spinach), or cooked high residue (cabbage, broccoli, etc) o Fruit -- Fresh (unpeeled skin & pulp), Dried (prunes, apricots, cherries, etc ),  or stewed ( applesauce)  o Whole grain breads, pasta, etc (whole wheat)  o Bran cereals    Bulking Agents -- This type of water-retaining fiber generally is easily obtained each day by one of the following:  o Psyllium bran -- The psyllium plant is remarkable because its ground seeds can retain so much water. This product is available as Metamucil, Konsyl, Effersyllium, Per Diem Fiber, or the less expensive generic preparation in drug and health food stores. Although labeled a laxative, it really  is not a laxative.  o Methylcellulose -- This is another fiber derived from wood which also retains water. It is available as Citrucel. o Polyethylene Glycol - and "artificial" fiber commonly called Miralax or Glycolax.  It is helpful for people with gassy or bloated feelings with regular fiber o Flax Seed - a less gassy fiber than psyllium   No reading or other relaxing activity while on the toilet. If bowel movements take longer than 5 minutes, you are too constipated   AVOID CONSTIPATION.  High fiber and water intake usually takes care of this.  Sometimes a laxative is needed to stimulate more frequent bowel movements, but    Laxatives are not a good long-term solution as it can wear the colon out. o Osmotics (Milk of Magnesia, Fleets phosphosoda, Magnesium citrate, MiraLax, GoLytely) are safer than  o Stimulants (Senokot, Castor Oil, Dulcolax, Ex Lax)    o Do not take laxatives for more than 7days in a row.    IF SEVERELY CONSTIPATED, try a Bowel Retraining Program: o Do not use laxatives.  o Eat a diet high in roughage, such as bran cereals and leafy vegetables.  o Drink six (6) ounces of prune or apricot juice each morning.  o Eat two (2) large servings of stewed fruit each day.  o Take one (1) heaping tablespoon of a psyllium-based bulking agent twice a day. Use sugar-free sweetener when possible to avoid excessive calories.  o Eat a normal breakfast.  o   Set aside 15 minutes after breakfast to sit on the toilet, but do not strain to have a bowel movement.  o If you do not have a bowel movement by the third day, use an enema and repeat the above steps.    Controlling diarrhea o Switch to liquids and simpler foods for a few days to avoid stressing your intestines further. o Avoid dairy products (especially milk & ice cream) for a short time.  The intestines often can lose the ability to digest lactose when stressed. o Avoid foods that cause gassiness or bloating.  Typical foods include  beans and other legumes, cabbage, broccoli, and dairy foods.  Every person has some sensitivity to other foods, so listen to our body and avoid those foods that trigger problems for you. o Adding fiber (Citrucel, Metamucil, psyllium, Miralax) gradually can help thicken stools by absorbing excess fluid and retrain the intestines to act more normally.  Slowly increase the dose over a few weeks.  Too much fiber too soon can backfire and cause cramping & bloating. o Probiotics (such as active yogurt, Align, etc) may help repopulate the intestines and colon with normal bacteria and calm down a sensitive digestive tract.  Most studies show it to be of mild help, though, and such products can be costly. o Medicines:   Bismuth subsalicylate (ex. Kayopectate, Pepto Bismol) every 30 minutes for up to 6 doses can help control diarrhea.  Avoid if pregnant.   Loperamide (Immodium) can slow down diarrhea.  Start with two tablets (4mg  total) first and then try one tablet every 6 hours.  Avoid if you are having fevers or severe pain.  If you are not better or start feeling worse, stop all medicines and call your doctor for advice o Call your doctor if you are getting worse or not better.  Sometimes further testing (cultures, endoscopy, X-ray studies, bloodwork, etc) may be needed to help diagnose and treat the cause of the diarrhea.   Diet and Irritable Bowel Syndrome  No cure has been found for irritable bowel syndrome (IBS). Many options are available to treat the symptoms. Your caregiver will give you the best treatments available for your symptoms. He or she will also encourage you to manage stress and to make changes to your diet. You need to work with your caregiver and Registered Dietician to find the best combination of medicine, diet, counseling, and support to control your symptoms. The following are some diet suggestions. FOODS THAT MAKE IBS WORSE  Fatty foods, such as [place] fries.  Milk products, such as  cheese or ice cream.  Chocolate.  Alcohol.  Caffeine (found in coffee and some sodas).  Carbonated drinks, such as soda. If certain foods cause symptoms, you should eat less of them or stop eating them. FOOD JOURNAL   Keep a journal of the foods that seem to cause distress. Write down:  What you are eating during the day and when.  What problems you are having after eating.  When the symptoms occur in relation to your meals.  What foods always make you feel badly.  Take your notes with you to your caregiver to see if you should stop eating certain foods. FOODS THAT MAKE IBS BETTER Fiber reduces IBS symptoms, especially constipation, because it makes stools soft, bulky, and easier to pass. Fiber is found in bran, bread, cereal, beans, fruit, and vegetables. Examples of foods with fiber include:  Apples.  Peaches.  Pears.  Berries.  Figs.  Broccoli, raw.  Cabbage.  Carrots.  Raw peas.  Kidney beans.  Lima beans.  Whole-grain bread.  Whole-grain cereal. Add foods with fiber to your diet a little at a time. This will let your body get used to them. Too much fiber at once might cause gas and swelling of your abdomen. This can trigger symptoms in a person with IBS. Caregivers usually recommend a diet with enough fiber to produce soft, painless bowel movements. High fiber diets may cause gas and bloating. However, these symptoms often go away within a few weeks, as your body adjusts. In many cases, dietary fiber may lessen IBS symptoms, particularly constipation. However, it may not help pain or diarrhea. High fiber diets keep the colon mildly enlarged (distended) with the added fiber. This may help prevent spasms in the colon. Some forms of fiber also keep water in the stool, thereby preventing hard stools that are difficult to pass.  Besides telling you to eat more foods with fiber, your caregiver may also tell you to get more fiber by taking a fiber pill or drinking  water mixed with a special high fiber powder. An example of this is a natural fiber laxative containing psyllium seed.  TIPS  Large meals can cause cramping and diarrhea in people with IBS. If this happens to you, try eating 4 or 5 small meals a day, or try eating less at each of your usual 3 meals. It may also help if your meals are low in fat and high in carbohydrates. Examples of carbohydrates are pasta, rice, whole-grain breads and cereals, fruits, and vegetables.  If dairy products cause your symptoms to flare up, you can try eating less of those foods. You might be able to handle yogurt better than other dairy products, because it contains bacteria that helps with digestion. Dairy products are an important source of calcium and other nutrients. If you need to avoid dairy products, be sure to talk with a Registered Dietitian about getting these nutrients through other food sources.  Drink enough water and fluids to keep your urine clear or pale yellow. This is important, especially if you have diarrhea. FOR MORE INFORMATION  International Foundation for Functional Gastrointestinal Disorders: www.iffgd.org  National Digestive Diseases Information Clearinghouse: digestive.StageSync.si Document Released: 01/11/2004 Document Revised: 01/13/2012 Document Reviewed: 09/28/2007 [facility] Patient Information 2014 [place], [place].

## 2013-10-20 ENCOUNTER — Ambulatory Visit (INDEPENDENT_AMBULATORY_CARE_PROVIDER_SITE_OTHER): Payer: Managed Care, Other (non HMO) | Admitting: Psychiatry

## 2013-10-20 DIAGNOSIS — F319 Bipolar disorder, unspecified: Secondary | ICD-10-CM

## 2013-10-20 DIAGNOSIS — F29 Unspecified psychosis not due to a substance or known physiological condition: Secondary | ICD-10-CM

## 2013-10-20 LAB — URINE CULTURE: Colony Count: NO GROWTH

## 2013-10-21 NOTE — Patient Instructions (Signed)
Discussed orally 
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
## 2013-10-22 LAB — RESPIRATORY CULTURE OR RESPIRATORY AND SPUTUM CULTURE: Organism ID, Bacteria: NORMAL

## 2013-10-23 NOTE — Assessment & Plan Note (Signed)
Increased nocturnal cough, cough suppressant prescribed

## 2013-10-23 NOTE — Assessment & Plan Note (Signed)
Updated lab data needed, currently on iron supplement

## 2013-10-23 NOTE — Assessment & Plan Note (Signed)
Improving, therapy has been extremely beneficial for Jane English, and she intends to continue with this. Medications as before, also sees psychiatry

## 2013-10-23 NOTE — Assessment & Plan Note (Signed)
Diet controlled only at this time HBa1C due

## 2013-10-23 NOTE — Assessment & Plan Note (Signed)
Controlled, no change in medication DASH diet and commitment to daily physical activity for a minimum of 30 minutes discussed and encouraged, as a part of hypertension management. The importance of attaining a healthy weight is also discussed.  

## 2013-10-23 NOTE — Assessment & Plan Note (Signed)
Symptomatic with abnormal UA, will treat presumptively and f/u c/s 

## 2013-10-24 NOTE — Assessment & Plan Note (Signed)
Controlled, no change in medication  

## 2013-10-24 NOTE — Assessment & Plan Note (Signed)
Diet controlled only, HBa1C due in 2 month Patient educated about the importance of limiting  Carbohydrate intake , the need to commit to daily physical activity for a minimum of 30 minutes , and to commit weight loss. The fact that changes in all these areas will reduce or eliminate all together the need to take medication

## 2013-10-24 NOTE — Assessment & Plan Note (Signed)
deterioated with recent stress of prolonged hospitalization and repeat surgery. Will benefit from therapy and pt is motivated to improve Med management through psych

## 2013-11-01 ENCOUNTER — Encounter (INDEPENDENT_AMBULATORY_CARE_PROVIDER_SITE_OTHER): Payer: Commercial Indemnity | Admitting: Surgery

## 2013-11-08 ENCOUNTER — Ambulatory Visit (INDEPENDENT_AMBULATORY_CARE_PROVIDER_SITE_OTHER): Payer: Managed Care, Other (non HMO) | Admitting: Psychiatry

## 2013-11-08 DIAGNOSIS — F29 Unspecified psychosis not due to a substance or known physiological condition: Secondary | ICD-10-CM

## 2013-11-08 DIAGNOSIS — F319 Bipolar disorder, unspecified: Secondary | ICD-10-CM

## 2013-11-08 NOTE — Patient Instructions (Signed)
Discussed orally 
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
## 2013-11-16 ENCOUNTER — Encounter (HOSPITAL_COMMUNITY): Payer: Self-pay | Admitting: Psychiatry

## 2013-11-16 ENCOUNTER — Ambulatory Visit (INDEPENDENT_AMBULATORY_CARE_PROVIDER_SITE_OTHER): Payer: Medicare Other | Admitting: Psychiatry

## 2013-11-16 VITALS — BP 140/80 | Ht 66.0 in | Wt 158.0 lb

## 2013-11-16 DIAGNOSIS — F4310 Post-traumatic stress disorder, unspecified: Secondary | ICD-10-CM | POA: Diagnosis not present

## 2013-11-16 DIAGNOSIS — F319 Bipolar disorder, unspecified: Secondary | ICD-10-CM | POA: Diagnosis not present

## 2013-11-16 MED ORDER — CARBAMAZEPINE 100 MG PO CHEW: CHEWABLE_TABLET | ORAL | Status: DC
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
## 2013-11-18 ENCOUNTER — Encounter: Payer: Self-pay | Admitting: Family Medicine

## 2013-11-18 ENCOUNTER — Ambulatory Visit (INDEPENDENT_AMBULATORY_CARE_PROVIDER_SITE_OTHER): Payer: Medicare Other | Admitting: Family Medicine

## 2013-11-18 ENCOUNTER — Other Ambulatory Visit: Payer: Self-pay

## 2013-11-18 VITALS — BP 132/84 | HR 83 | Resp 16 | Wt 159.0 lb

## 2013-11-18 DIAGNOSIS — R7302 Impaired glucose tolerance (oral): Secondary | ICD-10-CM

## 2013-11-18 DIAGNOSIS — D649 Anemia, unspecified: Secondary | ICD-10-CM

## 2013-11-18 DIAGNOSIS — Z79899 Other long term (current) drug therapy: Secondary | ICD-10-CM

## 2013-11-18 DIAGNOSIS — J45991 Cough variant asthma: Secondary | ICD-10-CM

## 2013-11-18 DIAGNOSIS — R7309 Other abnormal glucose: Secondary | ICD-10-CM | POA: Diagnosis not present

## 2013-11-18 DIAGNOSIS — Z139 Encounter for screening, unspecified: Secondary | ICD-10-CM

## 2013-11-18 DIAGNOSIS — G43709 Chronic migraine without aura, not intractable, without status migrainosus: Secondary | ICD-10-CM

## 2013-11-18 DIAGNOSIS — F411 Generalized anxiety disorder: Secondary | ICD-10-CM

## 2013-11-18 DIAGNOSIS — I10 Essential (primary) hypertension: Secondary | ICD-10-CM

## 2013-11-18 MED ORDER — ALBUTEROL SULFATE (2.5 MG/3ML) 0.083% IN NEBU: INHALATION_SOLUTION | RESPIRATORY_TRACT | Status: DC

## 2013-11-18 MED ORDER — IPRATROPIUM-ALBUTEROL 0.5-2.5 (3) MG/3ML IN SOLN: 3.0000 mL | Freq: Four times a day (QID) | RESPIRATORY_TRACT | Status: DC | PRN

## 2013-11-18 MED ORDER — IPRATROPIUM BROMIDE 0.02 % IN SOLN: 500.0000 ug | Freq: Four times a day (QID) | RESPIRATORY_TRACT | Status: DC | PRN

## 2013-11-18 MED ORDER — FLUTICASONE PROPIONATE 50 MCG/ACT NA SUSP: 2.0000 | Freq: Every day | NASAL | Status: DC

## 2013-11-18 MED ORDER — BUDESONIDE-FORMOTEROL FUMARATE 160-4.5 MCG/ACT IN AERO: 2.0000 | INHALATION_SPRAY | Freq: Two times a day (BID) | RESPIRATORY_TRACT | Status: DC

## 2013-11-18 MED ORDER — TOPIRAMATE 100 MG PO TABS: ORAL_TABLET | ORAL | Status: DC

## 2013-11-18 NOTE — Patient Instructions (Addendum)
F/u in 6 weeks, please call if you need me before   No changes in medication  At this time  CBC, fasting lipid, chem 7 and EGFr, HBA1C, vit D and iron and ferritin in 6 weeks before next visit

## 2013-11-18 NOTE — Addendum Note (Signed)
Addended by: Eual Fines on: 11/18/2013 01:37 PM   Modules accepted: Orders

## 2013-11-18 NOTE — Assessment & Plan Note (Signed)
Controlled medication refilled as requested

## 2013-11-18 NOTE — Assessment & Plan Note (Signed)
Controlled, no change in medication Patient advised to reduce carb and sweets, commit to regular physical activity, take meds as prescribed, test blood as directed, and attempt to lose weight, to improve blood sugar control.  

## 2013-11-18 NOTE — Assessment & Plan Note (Signed)
Patient educated about the importance of limiting  Carbohydrate intake , the need to commit to daily physical activity for a minimum of 30 minutes , and to commit weight loss. The fact that changes in all these areas will reduce or eliminate all together the development of diabetes is stressed.   Updated lab for next visit
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
## 2013-11-18 NOTE — Assessment & Plan Note (Signed)
Updated lab next visit, continue current medication

## 2013-11-18 NOTE — Assessment & Plan Note (Signed)
C/o increased headache and poor sleep, extremely angry ansd agitated with her husband and his family, openly hostile. Needs help with therapy, no change in med mangement of headaches or sleep. She is treated by psychiatry also

## 2013-11-23 ENCOUNTER — Ambulatory Visit (INDEPENDENT_AMBULATORY_CARE_PROVIDER_SITE_OTHER): Payer: Managed Care, Other (non HMO) | Admitting: Psychiatry

## 2013-11-23 DIAGNOSIS — F319 Bipolar disorder, unspecified: Secondary | ICD-10-CM | POA: Diagnosis not present
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
## 2013-11-23 NOTE — Patient Instructions (Signed)
Discussed orally 

## 2013-11-25 ENCOUNTER — Encounter: Payer: Self-pay | Admitting: Internal Medicine

## 2013-11-29 ENCOUNTER — Other Ambulatory Visit (INDEPENDENT_AMBULATORY_CARE_PROVIDER_SITE_OTHER): Payer: Managed Care, Other (non HMO)

## 2013-11-29 ENCOUNTER — Ambulatory Visit (INDEPENDENT_AMBULATORY_CARE_PROVIDER_SITE_OTHER): Payer: Managed Care, Other (non HMO)

## 2013-11-29 ENCOUNTER — Encounter: Payer: Self-pay | Admitting: Internal Medicine

## 2013-11-29 ENCOUNTER — Ambulatory Visit (INDEPENDENT_AMBULATORY_CARE_PROVIDER_SITE_OTHER): Payer: Managed Care, Other (non HMO) | Admitting: Psychiatry

## 2013-11-29 ENCOUNTER — Ambulatory Visit (INDEPENDENT_AMBULATORY_CARE_PROVIDER_SITE_OTHER): Payer: Managed Care, Other (non HMO) | Admitting: Internal Medicine

## 2013-11-29 VITALS — BP 140/84 | Wt 162.0 lb

## 2013-11-29 VITALS — BP 162/80 | HR 84 | Ht 66.0 in | Wt 162.2 lb

## 2013-11-29 DIAGNOSIS — Z111 Encounter for screening for respiratory tuberculosis: Secondary | ICD-10-CM

## 2013-11-29 DIAGNOSIS — F4310 Post-traumatic stress disorder, unspecified: Secondary | ICD-10-CM | POA: Diagnosis not present

## 2013-11-29 DIAGNOSIS — K5900 Constipation, unspecified: Secondary | ICD-10-CM

## 2013-11-29 DIAGNOSIS — F319 Bipolar disorder, unspecified: Secondary | ICD-10-CM

## 2013-11-29 DIAGNOSIS — G8929 Other chronic pain: Secondary | ICD-10-CM

## 2013-11-29 DIAGNOSIS — B9681 Helicobacter pylori [H. pylori] as the cause of diseases classified elsewhere: Secondary | ICD-10-CM

## 2013-11-29 DIAGNOSIS — D649 Anemia, unspecified: Secondary | ICD-10-CM | POA: Diagnosis not present

## 2013-11-29 DIAGNOSIS — R109 Unspecified abdominal pain: Secondary | ICD-10-CM

## 2013-11-29 DIAGNOSIS — K660 Peritoneal adhesions (postprocedural) (postinfection): Secondary | ICD-10-CM

## 2013-11-29 DIAGNOSIS — K2980 Duodenitis without bleeding: Secondary | ICD-10-CM | POA: Diagnosis not present

## 2013-11-29 DIAGNOSIS — A048 Other specified bacterial intestinal infections: Secondary | ICD-10-CM

## 2013-11-29 LAB — CBC
HEMATOCRIT: 30.9 % — AB (ref 36.0–46.0)
HEMOGLOBIN: 10 g/dL — AB (ref 12.0–15.0)
MCHC: 32.3 g/dL (ref 30.0–36.0)
MCV: 82.7 fl (ref 78.0–100.0)
Platelets: 314 10*3/uL (ref 150.0–400.0)
RBC: 3.74 Mil/uL — AB (ref 3.87–5.11)
RDW: 14.9 % — ABNORMAL HIGH (ref 11.5–14.6)
WBC: 4.8 10*3/uL (ref 4.5–10.5)

## 2013-11-29 LAB — FERRITIN: Ferritin: 5.8 ng/mL — ABNORMAL LOW (ref 10.0–291.0)

## 2013-11-29 LAB — IBC PANEL
Iron: 32 ug/dL — ABNORMAL LOW (ref 42–145)
Saturation Ratios: 6.8 % — ABNORMAL LOW (ref 20.0–50.0)
Transferrin: 334.5 mg/dL (ref 212.0–360.0)

## 2013-11-29 MED ORDER — ABDOMINAL BINDER/ELASTIC MED MISC: 1.0000 "application " | Freq: Every day | Status: DC

## 2013-11-29 MED ORDER — LUBIPROSTONE 24 MCG PO CAPS: 24.0000 ug | ORAL_CAPSULE | Freq: Two times a day (BID) | ORAL | Status: DC

## 2013-11-29 NOTE — Progress Notes (Signed)
Patient in to have PPD placed as she volunteers at a daycare.

## 2013-11-29 NOTE — Progress Notes (Signed)
Subjective:    Patient ID: Jane English, female    DOB: 17-Aug-1971, 43 y.o.   MRN: [id number]  HPI Jane English is a 43 year old female with a past medical history of GERD, depression, hypertension, migraines, constipation, multiple abdominal surgeries status post lysis of adhesion, H pylori duodenitis who is seen for followup.  Since her last visit she has had 2 abd surgeries performed by Dr. Johney Maine, the first in September 2014 the second in October 2014. She reports these procedures were very difficult for her, the second requiring a prolonged hospitalization for small bowel obstruction and repeat surgery for lysis of adhesions and reduction of obstruction.  She is not sure that this has helped her very much but she is sure she does not want any further surgeries. She reports she continues to have left lower and suprapubic pain which she feels is related to her "adhesions". She reports she the pain is worse with changing positions and Valsalva. She reports needing to hold her abdomen when she stands or changes position because the pain is worse.  She is eating small frequent meals and tolerating her diet well. She's not having significant nausea or vomiting. Her bowel function has been improved with lubiprostone. She is using lubiprostone 24 mcg twice daily. She reports weight loss and records indicate 6 pounds since she was last seen here about 5 months ago. She denies heartburn, dysphagia or odynophagia. No epigastric pain recently. She is taking pantoprazole 40 mg daily.  She did complete Pylera for H. pylori.  Review of Systems As per history of present illness, otherwise negative  Current Medications, Allergies, Past Medical History, Past Surgical History, Family History and Social History were reviewed in [employer] record.     Objective:   Physical Exam BP 162/80  Pulse 84  Ht 5\' 6"  (1.676 m)  Wt 162 lb 3.2 oz (73.573 kg)  BMI 26.19 kg/m2  LMP  11/15/2013 Constitutional: Well-developed and well-nourished. No distress. HEENT: Normocephalic and atraumatic. Oropharynx is clear and moist. No oropharyngeal exudate. Conjunctivae are normal.  No scleral icterus. Neck: Neck supple. Trachea midline. Cardiovascular: Normal rate, regular rhythm and intact distal pulses.  Pulmonary/chest: Effort normal and breath sounds normal. No wheezing, rales or rhonchi. Abdominal: Soft, well-healed abdominal scar, mild lower abdominal tenderness without rebound or guarding, nondistended. Bowel sounds active throughout.  Extremities: no clubbing, cyanosis, or edema Neurological: Alert and oriented to person place and time. Skin: Skin is warm and dry. No rashes noted. Psychiatric: Normal mood and affect. Behavior is normal.  CBC    Component Value Date/Time   WBC 5.8 10/11/2013 1517   RBC 3.64* 10/11/2013 1517   HGB 10.1* 10/11/2013 1517   HCT 30.5* 10/11/2013 1517   PLT 347 10/11/2013 1517   MCV 83.8 10/11/2013 1517   MCH 27.7 10/11/2013 1517   MCHC 33.1 10/11/2013 1517   RDW 14.1 10/11/2013 1517   LYMPHSABS 2.0 08/30/2013 0503   MONOABS 1.0 08/30/2013 0503   EOSABS 0.3 08/30/2013 0503   BASOSABS 0.1 08/30/2013 0503    CMP     Component Value Date/Time   NA 138 10/11/2013 1517   K 3.3* 10/11/2013 1517   CL 104 10/11/2013 1517   CO2 27 10/11/2013 1517   GLUCOSE 80 10/11/2013 1517   BUN 12 10/11/2013 1517   CREATININE 0.77 10/11/2013 1517   CREATININE 0.92 08/31/2013 0600   CALCIUM 9.0 10/11/2013 1517   PROT 7.5 08/30/2013 0503   ALBUMIN 2.9* 08/30/2013 0503  AST 13 08/30/2013 0503   ALT 21 08/30/2013 0503   ALKPHOS 149* 08/30/2013 0503   BILITOT 0.2* 08/30/2013 0503   GFRNONAA 75* 08/31/2013 0600   GFRAA 87* 08/31/2013 0600      Assessment & Plan:  43 year old female with a past medical history of GERD, depression, hypertension, migraines, constipation, multiple abdominal surgeries status post lysis of adhesion, H pylori duodenitis who is seen  for followup.  1.  Chronic lower abd pain/hx of adhesions/hx of SBO -- she had 2 surgeries this fall and a prolonged hospital course with the second one. She is slowly recovering and reports a heavy psychological and physical burden from these operations.  She continues to have chronic lower abdominal pain but she understands that she will likely need to learn to live with this. Her constipation has improved with lubiprostone 24 mcg. See below.  I have recommended an abdominal binder, which is ordered today, to help support her abdomen.  If pain persists or becomes worse, pain management specialist may be beneficial. She is encouraged to continue high-fiber, low-fat diet and work on daily exercise.  2.  Chronic constipation -- improved with lubiprostone. She will continue 24 mcg twice daily  3.  Anemia -- I am checking her blood count and iron studies today. Likely in part related to surgery. Hemoglobin was improving, though not yet normal, when last checked in December 2014  4.  H. pylori duodenitis -- treated in August. I am checking H. pylori stool antigen, off of PPI, to confirm eradication

## 2013-11-29 NOTE — Patient Instructions (Signed)
Your physician has requested that you go to the basement for lab work before leaving today.  We have sent the following medications to your pharmacy for you to pick up at your convenience: Amitiza  You have been given a prescription for an abdominal binder. You can take it to pharmaceutical  a  supply store and they will fir you for one.                                               We are excited to introduce MyChart, a new best-in-class service that provides you online access to important information in your electronic medical record. We want to make it easier for you to view your health information - all in one secure location - when and where you need it. We expect MyChart will enhance the quality of care and service we provide.  When you register for MyChart, you can:    View your test results.    Request appointments and receive appointment reminders via email.    Request medication renewals.    View your medical history, allergies, medications and immunizations.    Communicate with your physician's office through a password-protected site.    Conveniently print information such as your medication lists.  To find out if MyChart is right for you, please talk to a member of our clinical staff today. We will gladly answer your questions about this free health and wellness tool.  If you are age 45 or older and want a member of your family to have access to your record, you must provide written consent by completing a proxy form available at our office. Please speak to our clinical staff about guidelines regarding accounts for patients younger than age 45.  As you activate your MyChart account and need any technical assistance, please call the MyChart technical support line at [phone]-CHART [phone]) or email your question to [email or web address]. If you email your question(s), please include your name, a return phone number and the best time to reach you.  If you have  non-urgent health-related questions, you can send a message to our office through [place] at [email or web address]. If you have a medical emergency, call 911.  Thank you for using MyChart as your new health and wellness resource!   MyChart licensed from [employer],  1999-2010. Patents Pending.

## 2013-11-30 ENCOUNTER — Ambulatory Visit (HOSPITAL_COMMUNITY): Payer: Self-pay | Admitting: Psychiatry

## 2013-11-30 NOTE — Patient Instructions (Signed)
Discussed orally 
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
## 2013-12-01 ENCOUNTER — Telehealth: Payer: Self-pay | Admitting: *Deleted

## 2013-12-01 DIAGNOSIS — D649 Anemia, unspecified: Secondary | ICD-10-CM

## 2013-12-01 DIAGNOSIS — D509 Iron deficiency anemia, unspecified: Secondary | ICD-10-CM

## 2013-12-01 LAB — TB SKIN TEST
Induration: 0 mm
TB Skin Test: NEGATIVE

## 2013-12-01 MED ORDER — INTEGRA 62.5-62.5-40-3 MG PO CAPS: 1.0000 | ORAL_CAPSULE | Freq: Every day | ORAL | Status: DC

## 2013-12-01 NOTE — Telephone Encounter (Signed)
Informed pt of lab results and the need for Integra daily. She will continue Amitiza. Reminder in for repeat labs in 3 months. Pt reports problems with the Abdominal Binder. States she went to [employer] will not cover. Called around and [facility] has them for $18.50 to $27.25. Informed pt and gave her directions and she will try there; stated [facility]. [facility] wanted >$60. Pt will call for problems.

## 2013-12-01 NOTE — Telephone Encounter (Signed)
Message copied by Lance Morin on Wed Dec 01, 2013  4:04 PM ------      Message from: Jerene Bears      Created: Mon Nov 29, 2013  5:19 PM       Persistent anemia       Iron deficiency noted      Have her start Integra 1 capsule daily x3 months      Repeat CBC and iron studies in 3 months      Continue lubiprostone 24 mcg twice daily      She should call if she cannot tolerate oral iron ------

## 2013-12-03 ENCOUNTER — Telehealth: Payer: Self-pay | Admitting: Family Medicine

## 2013-12-03 NOTE — Telephone Encounter (Signed)
Symbicort is not covered by her ins, [employer],need to  substitute with covered drug, pls let me knwo what it is

## 2013-12-03 NOTE — Telephone Encounter (Signed)
pls see f/u msg did not get sent so re sending

## 2013-12-03 NOTE — Telephone Encounter (Signed)
I looked online and it looks like Qvar is covered

## 2013-12-03 NOTE — Addendum Note (Signed)
Addended by: Tula Nakayama E on: 12/03/2013 01:44 PM   Modules accepted: Orders

## 2013-12-03 NOTE — Telephone Encounter (Signed)
Thanks I have entered the med historically pls follow through on the change and notify pt also

## 2013-12-07 ENCOUNTER — Telehealth: Payer: Self-pay | Admitting: Family Medicine

## 2013-12-07 ENCOUNTER — Encounter: Payer: Self-pay | Admitting: Family Medicine

## 2013-12-07 ENCOUNTER — Ambulatory Visit (INDEPENDENT_AMBULATORY_CARE_PROVIDER_SITE_OTHER): Payer: Medicare Other | Admitting: Family Medicine

## 2013-12-07 ENCOUNTER — Ambulatory Visit (INDEPENDENT_AMBULATORY_CARE_PROVIDER_SITE_OTHER): Payer: Managed Care, Other (non HMO) | Admitting: Psychiatry

## 2013-12-07 VITALS — BP 150/92 | HR 100 | Temp 99.0°F | Resp 18 | Ht 65.25 in | Wt 160.0 lb

## 2013-12-07 DIAGNOSIS — F329 Major depressive disorder, single episode, unspecified: Secondary | ICD-10-CM

## 2013-12-07 DIAGNOSIS — R7302 Impaired glucose tolerance (oral): Secondary | ICD-10-CM

## 2013-12-07 DIAGNOSIS — J45991 Cough variant asthma: Secondary | ICD-10-CM

## 2013-12-07 DIAGNOSIS — F4310 Post-traumatic stress disorder, unspecified: Secondary | ICD-10-CM | POA: Diagnosis not present

## 2013-12-07 DIAGNOSIS — K219 Gastro-esophageal reflux disease without esophagitis: Secondary | ICD-10-CM | POA: Diagnosis not present

## 2013-12-07 DIAGNOSIS — F3289 Other specified depressive episodes: Secondary | ICD-10-CM

## 2013-12-07 DIAGNOSIS — I10 Essential (primary) hypertension: Secondary | ICD-10-CM

## 2013-12-07 DIAGNOSIS — F3189 Other bipolar disorder: Secondary | ICD-10-CM | POA: Diagnosis not present

## 2013-12-07 DIAGNOSIS — F319 Bipolar disorder, unspecified: Secondary | ICD-10-CM | POA: Diagnosis not present

## 2013-12-07 DIAGNOSIS — R7309 Other abnormal glucose: Secondary | ICD-10-CM | POA: Diagnosis not present

## 2013-12-07 DIAGNOSIS — J111 Influenza due to unidentified influenza virus with other respiratory manifestations: Secondary | ICD-10-CM | POA: Diagnosis not present

## 2013-12-07 MED ORDER — PROMETHAZINE-DM 6.25-15 MG/5ML PO SYRP: ORAL_SOLUTION | ORAL | Status: DC

## 2013-12-07 MED ORDER — FLUCONAZOLE 150 MG PO TABS: ORAL_TABLET | ORAL | Status: DC

## 2013-12-07 MED ORDER — KETOROLAC TROMETHAMINE 60 MG/2ML IM SOLN
60.0000 mg | Freq: Once | INTRAMUSCULAR | Status: AC
  Administered 2013-12-07: 60 mg via INTRAMUSCULAR

## 2013-12-07 MED ORDER — PREDNISONE 5 MG PO TABS: 5.0000 mg | ORAL_TABLET | Freq: Two times a day (BID) | ORAL | Status: AC

## 2013-12-07 MED ORDER — BENZONATATE 100 MG PO CAPS: 100.0000 mg | ORAL_CAPSULE | Freq: Two times a day (BID) | ORAL | Status: DC | PRN

## 2013-12-07 MED ORDER — METHYLPREDNISOLONE ACETATE 80 MG/ML IJ SUSP
80.0000 mg | Freq: Once | INTRAMUSCULAR | Status: AC
  Administered 2013-12-07: 80 mg via INTRAMUSCULAR

## 2013-12-07 MED ORDER — LEVOFLOXACIN 500 MG PO TABS: 500.0000 mg | ORAL_TABLET | Freq: Every day | ORAL | Status: DC

## 2013-12-07 MED ORDER — OSELTAMIVIR PHOSPHATE 75 MG PO CAPS: 75.0000 mg | ORAL_CAPSULE | Freq: Two times a day (BID) | ORAL | Status: DC

## 2013-12-07 NOTE — Progress Notes (Signed)
   Subjective:    Patient ID: Jane English, female    DOB: Dec 28, 1970, 43 y.o.   MRN: [id number]  HPI 3 day h/o acute onset of fever, generalized body aches and chills, sore throat and headache. Temp up to 101 Increased wheezing cough and chest congestion, yellow sputum production with shortness of breath Headache generalized aggravated by excessive cough   Review of Systems See HPI Denies chest pains, palpitations and leg swelling Denies abdominal pain, nausea, vomiting,diarrhea or constipation.   Denies dysuria, frequency, hesitancy or incontinence. Denies joint pain, swelling and limitation in mobility. Denies depression, anxiety or insomnia. Denies skin break down or rash.        Objective:   Physical Exam BP 150/92  Pulse 100  Temp(Src) 99 F (37.2 C)  Resp 18  Ht 5' 5.25" (1.657 m)  Wt 160 lb (72.576 kg)  BMI 26.43 kg/m2  SpO2 99%  LMP 11/15/2013 Patient alert and oriented and in no cardiopulmonary distress.Ill appearing weak and in pain  HEENT: No facial asymmetry, EOMI, no sinus tenderness,  oropharynx pink and moist.  Neck decreased though adequate ROM no adenopathy.TM clear  Chest: bilaterlalwheezes and crackles decreaed air entry  CVS: S1, S2 no murmurs, no S3.  ABD: Soft non tender. Bowel sounds normal.  Ext: No edema  MS: decreased  ROM lumbar  Spine,adequate in  shoulders, hips and knees.  Skin: Intact, no ulcerations or rash noted.  Psych: Good eye contact, normal affect. Memory intact not anxious or depressed appearing.  CNS: CN 2-12 intact, power, tone and sensation normal throughout.        Assessment & Plan:   Influenza with other respiratory manifestations Medication prescribed for influenza and acute bacterial bronchitis  Cough variant asthma Uncontrolled with current flare, depo medrol then oral prednisone  ESSENTIAL HYPERTENSION Uncontrolled today, but combination of multiple decongestants, poor sleep and pain all contribute,  no med change at this time  GERD (gastroesophageal reflux disease) Controlled, no change in medication   IGT (impaired glucose tolerance) Patient educated about the importance of limiting  Carbohydrate intake , the need to commit to daily physical activity for a minimum of 30 minutes , and to commit weight loss. The fact that changes in all these areas will reduce or eliminate all together the development of diabetes is stressed.   Updated lab needed at/ before next visit.   DEPRESSION, CHRONIC Continue close management by psychiatry and psychologist, benefiting from this and needs this

## 2013-12-07 NOTE — Patient Instructions (Addendum)
F/u as before Call if you are getting worse or not much improved in 3 to 5 days.  You are treated for influenza, asthma flare and acute bronchitis.Six meds are sent to the pharmacy  You have received toradol and depo medrol in the office , as well as tylenol for headache and generalized pain  Influenza A (H1N1) H1N1 formerly called "swine flu" is a new influenza virus causing sickness in people. The H1N1 virus is different from seasonal influenza viruses. However, the H1N1 symptoms are similar to seasonal influenza and it is spread from person to person. You may be at higher risk for serious problems if you have underlying serious medical conditions. The CDC and the [facility] are following reported cases around the world. CAUSES   The flu is thought to spread mainly person-to-person through coughing or sneezing of infected people.  A person may become infected by touching something with the virus on it and then touching their mouth or nose. SYMPTOMS   Fever.  Headache.  Tiredness.  Cough.  Sore throat.  Runny or stuffy nose.  Body aches.  Diarrhea and vomiting These symptoms are referred to as "flu-like symptoms." A lot of different illnesses, including the common cold, may have similar symptoms. DIAGNOSIS   There are tests that can tell if you have the H1N1 virus.  Confirmed cases of H1N1 will be reported to the state or local health department.  A doctor's exam may be needed to tell whether you have an infection that is a complication of the flu. HOME CARE INSTRUCTIONS   Stay informed. Visit the [facility] website for current recommendations. Visit [facility]. You may also call 1-800-CDC-INFO (608)625-5900).  Get help early if you develop any of the above symptoms.  If you are at high risk from complications of the flu, talk to your caregiver as soon as you develop flu-like symptoms. Those at higher risk for complications include:  People 65 years  or older.  People with chronic medical conditions.  Pregnant women.  Young children.  Your caregiver may recommend antiviral medicine to help treat the flu.  If you get the flu, get plenty of rest, drink enough water and fluids to keep your urine clear or pale yellow, and avoid using alcohol or tobacco.  You may take over-the-counter medicine to relieve the symptoms of the flu if your caregiver approves. (Never give aspirin to children or teenagers who have flu-like symptoms, particularly fever). TREATMENT  If you do get sick, antiviral drugs are available. These drugs can make your illness milder and make you feel better faster. Treatment should start soon after illness starts. It is only effective if taken within the first day of becoming ill. Only your caregiver can prescribe antiviral medication.  PREVENTION   Cover your nose and mouth with a tissue or your arm when you cough or sneeze. Throw the tissue away.  Wash your hands often with soap and warm water, especially after you cough or sneeze. Alcohol-based cleaners are also effective against germs.  Avoid touching your eyes, nose or mouth. This is one way germs spread.  Try to avoid contact with sick people. Follow public health advice regarding school closures. Avoid crowds.  Stay home if you get sick. Limit contact with others to keep from infecting them. People infected with the H1N1 virus may be able to infect others anywhere from 1 day before feeling sick to 5-7 days after getting flu symptoms.  An H1N1 vaccine is available to help protect  against the virus. In addition to the H1N1 vaccine, you will need to be vaccinated for seasonal influenza. The H1N1 and seasonal vaccines may be given on the same day. The CDC especially recommends the H1N1 vaccine for:  Pregnant women.  People who live with or care for children younger than 65 months of age.  Health care and emergency services personnel.  Persons between the ages of 65  months through 73 years of age.  People from ages 52 through 61 years who are at higher risk for H1N1 because of chronic health disorders or immune system problems. FACEMASKS In community and home settings, the use of facemasks and N95 respirators are not normally recommended. In certain circumstances, a facemask or N95 respirator may be used for persons at increased risk of severe illness from influenza. Your caregiver can give additional recommendations for facemask use. IN CHILDREN, EMERGENCY WARNING SIGNS THAT NEED URGENT MEDICAL CARE:  Fast breathing or trouble breathing.  Bluish skin color.  Not drinking enough fluids.  Not waking up or not interacting normally.  Being so fussy that the child does not want to be held.  Your child has an oral temperature above 102 F (38.9 C), not controlled by medicine.  Your baby is older than 3 months with a rectal temperature of 102 F (38.9 C) or higher.  Your baby is 18 months old or younger with a rectal temperature of 100.4 F (38 C) or higher.  Flu-like symptoms improve but then return with fever and worse cough. IN ADULTS, EMERGENCY WARNING SIGNS THAT NEED URGENT MEDICAL CARE:  Difficulty breathing or shortness of breath.  Pain or pressure in the chest or abdomen.  Sudden dizziness.  Confusion.  Severe or persistent vomiting.  Bluish color.  You have a oral temperature above 102 F (38.9 C), not controlled by medicine.  Flu-like symptoms improve but return with fever and worse cough. SEEK IMMEDIATE MEDICAL CARE IF:  You or someone you know is experiencing any of the above symptoms. When you arrive at the emergency center, report that you think you have the flu. You may be asked to wear a mask and/or sit in a secluded area to protect others from getting sick. MAKE SURE YOU:   Understand these instructions.  Will watch your condition.  Will get help right away if you are not doing well or get worse. Some of this  information courtesy of the CDC.  Document Released: 04/08/2008 Document Revised: 01/13/2012 Document Reviewed: 04/08/2008 [facility] Patient Information 2014 [place], [place].
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
## 2013-12-07 NOTE — Telephone Encounter (Signed)
Patient is coming in for an appointment today

## 2013-12-07 NOTE — Patient Instructions (Signed)
Discussed orally 

## 2013-12-07 NOTE — Telephone Encounter (Signed)
Patient will come in for appt

## 2013-12-08 ENCOUNTER — Ambulatory Visit (HOSPITAL_COMMUNITY): Payer: Self-pay | Admitting: Psychiatry

## 2013-12-08 NOTE — Telephone Encounter (Signed)
Patient aware.

## 2013-12-13 ENCOUNTER — Ambulatory Visit (HOSPITAL_COMMUNITY): Payer: Self-pay | Admitting: Psychiatry

## 2013-12-14 ENCOUNTER — Ambulatory Visit (HOSPITAL_COMMUNITY): Payer: Self-pay | Admitting: Psychiatry

## 2013-12-14 ENCOUNTER — Encounter (HOSPITAL_COMMUNITY): Payer: Self-pay | Admitting: Psychiatry

## 2013-12-14 ENCOUNTER — Ambulatory Visit (INDEPENDENT_AMBULATORY_CARE_PROVIDER_SITE_OTHER): Payer: Managed Care, Other (non HMO) | Admitting: Psychiatry

## 2013-12-14 VITALS — BP 150/88 | Ht 66.0 in | Wt 163.0 lb

## 2013-12-14 DIAGNOSIS — F319 Bipolar disorder, unspecified: Secondary | ICD-10-CM

## 2013-12-14 MED ORDER — CARBAMAZEPINE 100 MG PO CHEW: CHEWABLE_TABLET | ORAL | Status: DC

## 2013-12-14 MED ORDER — ALPRAZOLAM 1 MG PO TABS: 1.0000 mg | ORAL_TABLET | Freq: Two times a day (BID) | ORAL | Status: DC
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
## 2013-12-15 ENCOUNTER — Telehealth: Payer: Self-pay | Admitting: Internal Medicine

## 2013-12-15 DIAGNOSIS — K5900 Constipation, unspecified: Secondary | ICD-10-CM

## 2013-12-15 MED ORDER — ABDOMINAL BINDER/ELASTIC MED MISC: 1.0000 "application " | Freq: Every day | Status: DC

## 2013-12-15 NOTE — Telephone Encounter (Signed)
I have reprinted the rx for the patient.  She asked that I mail it. I did today

## 2013-12-21 NOTE — Assessment & Plan Note (Signed)
Continue close management by psychiatry and psychologist, benefiting from this and needs this

## 2013-12-21 NOTE — Assessment & Plan Note (Signed)
Patient educated about the importance of limiting  Carbohydrate intake , the need to commit to daily physical activity for a minimum of 30 minutes , and to commit weight loss. The fact that changes in all these areas will reduce or eliminate all together the development of diabetes is stressed.   Updated lab needed at/ before next visit.  

## 2013-12-21 NOTE — Assessment & Plan Note (Signed)
Controlled, no change in medication  

## 2013-12-21 NOTE — Assessment & Plan Note (Signed)
Uncontrolled today, but combination of multiple decongestants, poor sleep and pain all contribute, no med change at this time

## 2013-12-21 NOTE — Assessment & Plan Note (Addendum)
Uncontrolled with current flare, depo medrol then oral prednisone

## 2013-12-21 NOTE — Assessment & Plan Note (Signed)
Medication prescribed for influenza and acute bacterial bronchitis

## 2013-12-24 DIAGNOSIS — G894 Chronic pain syndrome: Secondary | ICD-10-CM | POA: Diagnosis not present

## 2013-12-27 ENCOUNTER — Telehealth: Payer: Self-pay | Admitting: Internal Medicine

## 2013-12-27 NOTE — Telephone Encounter (Signed)
Patient reports continued abdominal pain and constipation. .  She reports that the abdominal binder is not helping with the pain.  Your note from 11/29/13 mentioned a possible referral to a pain clinic.  She is already seeing a pain clinic specialist in [place] and has fentanyl patches. She was sent to a therapist also last week from the pain clinic for alternative therapies and has an upcoming appt this week made by her pain specialist (she is not sure what the appt is for).  Pain is much worse that it was prior to the surgeries she had in December.  She reports amitiza was helping, but not any more.  She would like to be seen by you.  Please advise the next step.

## 2013-12-28 NOTE — Telephone Encounter (Signed)
Patient advised of recommendations.  She is advised that she should call back if she has additional questions or concerns

## 2013-12-28 NOTE — Telephone Encounter (Signed)
I do think continuing with chronic pain management strategies it is important Avoiding constipation is also important. She should continue lubiprostone 24 mcg twice daily and add MiraLax 17 g once to twice daily to ensure she is having adequate bowel movements With her history of adhesions and multiple abdominal surgeries, constipation will likely exacerbate her pain

## 2013-12-29 DIAGNOSIS — G894 Chronic pain syndrome: Secondary | ICD-10-CM | POA: Diagnosis not present

## 2013-12-29 DIAGNOSIS — M961 Postlaminectomy syndrome, not elsewhere classified: Secondary | ICD-10-CM | POA: Diagnosis not present

## 2013-12-30 ENCOUNTER — Ambulatory Visit: Payer: Managed Care, Other (non HMO) | Admitting: Family Medicine

## 2013-12-30 ENCOUNTER — Ambulatory Visit (HOSPITAL_COMMUNITY): Payer: Self-pay | Admitting: Psychiatry

## 2014-01-04 ENCOUNTER — Other Ambulatory Visit: Payer: Self-pay

## 2014-01-04 MED ORDER — IPRATROPIUM BROMIDE 0.02 % IN SOLN: 500.0000 ug | Freq: Four times a day (QID) | RESPIRATORY_TRACT | Status: DC | PRN

## 2014-01-04 MED ORDER — ALBUTEROL SULFATE (2.5 MG/3ML) 0.083% IN NEBU: 2.5000 mg | INHALATION_SOLUTION | Freq: Four times a day (QID) | RESPIRATORY_TRACT | Status: DC | PRN

## 2014-01-07 ENCOUNTER — Other Ambulatory Visit: Payer: Self-pay

## 2014-01-07 ENCOUNTER — Encounter: Payer: Self-pay | Admitting: Family Medicine

## 2014-01-07 ENCOUNTER — Ambulatory Visit (INDEPENDENT_AMBULATORY_CARE_PROVIDER_SITE_OTHER): Payer: Medicare Other | Admitting: Family Medicine

## 2014-01-07 ENCOUNTER — Telehealth: Payer: Self-pay

## 2014-01-07 VITALS — BP 150/90 | HR 98 | Resp 16 | Ht 62.0 in | Wt 162.0 lb

## 2014-01-07 DIAGNOSIS — F3289 Other specified depressive episodes: Secondary | ICD-10-CM

## 2014-01-07 DIAGNOSIS — I10 Essential (primary) hypertension: Secondary | ICD-10-CM | POA: Diagnosis not present

## 2014-01-07 DIAGNOSIS — R109 Unspecified abdominal pain: Secondary | ICD-10-CM | POA: Diagnosis not present

## 2014-01-07 DIAGNOSIS — F329 Major depressive disorder, single episode, unspecified: Secondary | ICD-10-CM

## 2014-01-07 DIAGNOSIS — F4310 Post-traumatic stress disorder, unspecified: Secondary | ICD-10-CM | POA: Diagnosis not present

## 2014-01-07 DIAGNOSIS — J302 Other seasonal allergic rhinitis: Secondary | ICD-10-CM

## 2014-01-07 DIAGNOSIS — R7309 Other abnormal glucose: Secondary | ICD-10-CM

## 2014-01-07 DIAGNOSIS — R7301 Impaired fasting glucose: Secondary | ICD-10-CM

## 2014-01-07 DIAGNOSIS — J309 Allergic rhinitis, unspecified: Secondary | ICD-10-CM

## 2014-01-07 DIAGNOSIS — R7302 Impaired glucose tolerance (oral): Secondary | ICD-10-CM

## 2014-01-07 MED ORDER — OLOPATADINE HCL 0.1 % OP SOLN: 1.0000 [drp] | Freq: Two times a day (BID) | OPHTHALMIC | Status: DC

## 2014-01-07 MED ORDER — BECLOMETHASONE DIPROPIONATE 40 MCG/ACT IN AERS: 2.0000 | INHALATION_SPRAY | Freq: Two times a day (BID) | RESPIRATORY_TRACT | Status: DC

## 2014-01-07 MED ORDER — TRIAMTERENE-HCTZ 37.5-25 MG PO TABS: 1.0000 | ORAL_TABLET | Freq: Every day | ORAL | Status: DC

## 2014-01-07 NOTE — Telephone Encounter (Signed)
Med note opened in error. Med change already addressed

## 2014-01-07 NOTE — Patient Instructions (Signed)
F/u in 4 weeks, call if you need me before  New additional medication for blood pressure  Chem 7 and tSH in 4 weeks and HBA1C , 3 to 5 days prior to f/u  Patanol will be refilled  Think seriously about our discussion today as to what can be dione to improve your health

## 2014-01-07 NOTE — Telephone Encounter (Signed)
Pt states her symbicort isn't covered. Already has Qvar on her list with the symbicort but patient states she has never took that one. I have seen it

## 2014-01-08 DIAGNOSIS — R109 Unspecified abdominal pain: Secondary | ICD-10-CM | POA: Insufficient documentation

## 2014-01-08 NOTE — Assessment & Plan Note (Signed)
Patient educated about the importance of limiting  Carbohydrate intake , the need to commit to daily physical activity for a minimum of 30 minutes , and to commit weight loss. The fact that changes in all these areas will reduce or eliminate all together the development of diabetes is stressed.   Updated lab needed at/ before next visit.  

## 2014-01-08 NOTE — Assessment & Plan Note (Signed)
Deteriorating , has significant ongoiung c/o abdominal pain and frustration with health care

## 2014-01-08 NOTE — Assessment & Plan Note (Signed)
Ongoing daily abdominal pain s/p lysis of adhesions in 2014, negatively affecting patient's life.Psychiatril help needed which she is currently getting, also reports fentanyl patch of no use , tablets  Worked better but states her pain Docs are" not listening" to her  I have suggested strongly seeking inpatient psych to help to get her stabilized, she will consider this, currently no threat to herself or to anyone. Will also send a message to behavioral health

## 2014-01-08 NOTE — Progress Notes (Signed)
Subjective:    Patient ID: Jane English, female    DOB: 1971/04/07, 43 y.o.   MRN: [id number]  HPI The PT is here for follow up and re-evaluation of chronic medical conditions, medication management and review of any available recent lab and radiology data.  Preventive health is updated, specifically  Cancer screening and Immunization.   Continues to c/o uncontrolled and excessive abdominal pain which has taken over her life. States intercourse is painful. States pain Doc is not ;listening to her, has no f/u scheduled with GI and recently put on an abdominal band with no relief. Continues to vent negatively with regard to recent surgery for lysis of adhesions.sees psychologist and psychiatry regularly , which is a very good thing, but she may need inpatient care as her condition today seems in some ways worse than previous visit The PT denies any adverse reactions to current medications since the last visit.       Review of Systems See HPI Denies recent fever or chills. Denies sinus pressure, nasal congestion, ear pain or sore throat.Increased watery itchy eyes from allergies wants med refilled Denies chest congestion, productive cough or wheezing. Denies chest pains, palpitations and leg swelling   Denies dysuria, frequency, hesitancy or incontinence. Denies joint pain, swelling and limitation in mobility. Denies skin break down or rash.        Objective:   Physical Exam  BP 150/90  Pulse 98  Resp 16  Ht 5\' 2"  (1.575 m)  Wt 162 lb (73.483 kg)  BMI 29.62 kg/m2  SpO2 99% Patient alert and oriented and in no cardiopulmonary distress.  HEENT: No facial asymmetry, EOMI, no sinus tenderness,  oropharynx pink and moist.  Neck supple no adenopathy.  Chest: Clear to auscultation bilaterally.  CVS: S1, S2 no murmurs, no S3.  ABD: Soft diffuse generalized tenderness, no guarding or rebound. Midline surgical scar well healed. Bowel sounds normal.  Ext: No edema  MS:  Adequate ROM spine, shoulders, hips and knees.  Skin: Intact, no ulcerations or rash noted.  Psych: Good eye contact,anxious and agitated, angry at times. Memory intact . Not suicidal or homicidal  CNS: CN 2-12 intact, power,  normal throughout.       Assessment & Plan:  ESSENTIAL HYPERTENSION Uncontrolled, additional medication added DASH diet and commitment to daily physical activity for a minimum of 30 minutes discussed and encouraged, as a part of hypertension management. The importance of attaining a healthy weight is also discussed.   PTSD (post-traumatic stress disorder) Ongoing issue, being treated by psychiatry, however pt does not appear to be improving. At today's visit she is extremely angry as afar as recent abdominal surgery and pain management are concerned.  She is to consider the need ror inpatient management of her mental state, she is neither suicidal or homicidal currently  Abdominal pain Ongoing daily abdominal pain s/p lysis of adhesions in 2014, negatively affecting patient's life.Psychiatril help needed which she is currently getting, also reports fentanyl patch of no use , tablets  Worked better but states her pain Docs are" not listening" to her  I have suggested strongly seeking inpatient psych to help to get her stabilized, she will consider this, currently no threat to herself or to anyone. Will also send a message to behavioral health  DEPRESSION, CHRONIC Deteriorating , has significant ongoiung c/o abdominal pain and frustration with health care   IGT (impaired glucose tolerance) Patient educated about the importance of limiting  Carbohydrate intake , the need  to commit to daily physical activity for a minimum of 30 minutes , and to commit weight loss. The fact that changes in all these areas will reduce or eliminate all together the development of diabetes is stressed.   Updated lab needed at/ before next visit.   Seasonal allergies Increased  refill paptanol

## 2014-01-08 NOTE — Assessment & Plan Note (Signed)
Ongoing issue, being treated by psychiatry, however pt does not appear to be improving. At today's visit she is extremely angry as afar as recent abdominal surgery and pain management are concerned.  She is to consider the need ror inpatient management of her mental state, she is neither suicidal or homicidal currently

## 2014-01-08 NOTE — Assessment & Plan Note (Signed)
Increased refill paptanol

## 2014-01-08 NOTE — Assessment & Plan Note (Signed)
Uncontrolled, additional medication added DASH diet and commitment to daily physical activity for a minimum of 30 minutes discussed and encouraged, as a part of hypertension management. The importance of attaining a healthy weight is also discussed.

## 2014-01-14 ENCOUNTER — Ambulatory Visit (INDEPENDENT_AMBULATORY_CARE_PROVIDER_SITE_OTHER): Payer: Managed Care, Other (non HMO) | Admitting: Psychiatry

## 2014-01-14 DIAGNOSIS — F319 Bipolar disorder, unspecified: Secondary | ICD-10-CM

## 2014-01-14 NOTE — Patient Instructions (Signed)
Discussed orally 
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
## 2014-01-17 ENCOUNTER — Telehealth: Payer: Self-pay | Admitting: Internal Medicine

## 2014-01-17 NOTE — Telephone Encounter (Signed)
Pt states she is taking her medication for constipation and it is working, she is not constipated. States she is however still having pain in her LLQ and wants to know what she can do for this. Please advise.

## 2014-01-17 NOTE — Telephone Encounter (Signed)
Patient advised She has agreed to proceed with [facility] referral.  She is aware I will work on this and contact her with an appt

## 2014-01-17 NOTE — Telephone Encounter (Signed)
Difficult situation, I am not abandoning the patient. Patient told the nurse today that her pain is her same chronic pain which is unchanged. Having bowel movements making obstruction very unlikely. Plain film and cross-sectional imaging unlikely to provide additional benefit given the chronicity of her pain and unchanged nature No endoscopic management would be expected to help this pain She can call Gen. surgery office for followup with them to ensure they agree with me [facility] care referral can be again offered and placed if she is willing Pain management clinic as before

## 2014-01-17 NOTE — Telephone Encounter (Signed)
Patient is very upset by Dr. Vena Rua response.   She states that you and she discussed a further workup for the pain.  She wants x-rays performed to find out where this pain is coming from.  She does not want to see Nicoletta Ba PA or other APP or go to [facility]. Dr. Hilarie Fredrickson please advise next step.  She states that fentanyl patch provided from pain clinic does not help at all.

## 2014-01-17 NOTE — Telephone Encounter (Signed)
She has a hx of chronic pain and surgery x 2 in the fall with Dr. Johney Maine for LOA Constipation has been an issue, but I see that this has improved.   Assuming this is her chronic pain, which has been well documented, then the best strategy is pain management clinic.  She has had colonoscopy in Aug 2014 which was unremarkable APP appointment can be offered, but not sure we will have new recommendations If she is interested in another opinion, referral to [facility] can be offered.

## 2014-01-18 NOTE — Telephone Encounter (Signed)
Patient aware of [facility] appointment for 01/24/14 10:00 with Dr. Liz Malady.  Notes faxed to [phone] attention Patrice

## 2014-01-24 DIAGNOSIS — G894 Chronic pain syndrome: Secondary | ICD-10-CM | POA: Diagnosis not present

## 2014-01-24 DIAGNOSIS — M545 Low back pain, unspecified: Secondary | ICD-10-CM | POA: Diagnosis not present

## 2014-01-24 DIAGNOSIS — R109 Unspecified abdominal pain: Secondary | ICD-10-CM | POA: Diagnosis not present

## 2014-01-28 ENCOUNTER — Ambulatory Visit (INDEPENDENT_AMBULATORY_CARE_PROVIDER_SITE_OTHER): Payer: Managed Care, Other (non HMO) | Admitting: Psychiatry

## 2014-01-28 DIAGNOSIS — F319 Bipolar disorder, unspecified: Secondary | ICD-10-CM

## 2014-01-28 DIAGNOSIS — F4310 Post-traumatic stress disorder, unspecified: Secondary | ICD-10-CM | POA: Diagnosis not present
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
## 2014-01-31 NOTE — Patient Instructions (Signed)
Discussed orally 

## 2014-02-01 ENCOUNTER — Ambulatory Visit (INDEPENDENT_AMBULATORY_CARE_PROVIDER_SITE_OTHER): Payer: Managed Care, Other (non HMO) | Admitting: Psychiatry

## 2014-02-01 DIAGNOSIS — F4310 Post-traumatic stress disorder, unspecified: Secondary | ICD-10-CM | POA: Diagnosis not present

## 2014-02-01 DIAGNOSIS — F319 Bipolar disorder, unspecified: Secondary | ICD-10-CM | POA: Diagnosis not present
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
## 2014-02-01 NOTE — Patient Instructions (Signed)
Discussed orally 

## 2014-02-03 ENCOUNTER — Encounter (INDEPENDENT_AMBULATORY_CARE_PROVIDER_SITE_OTHER): Payer: Self-pay

## 2014-02-03 ENCOUNTER — Other Ambulatory Visit: Payer: Self-pay | Admitting: Family Medicine

## 2014-02-03 ENCOUNTER — Encounter: Payer: Self-pay | Admitting: Family Medicine

## 2014-02-03 ENCOUNTER — Ambulatory Visit (INDEPENDENT_AMBULATORY_CARE_PROVIDER_SITE_OTHER): Payer: Medicare Other | Admitting: Family Medicine

## 2014-02-03 VITALS — BP 176/92 | HR 82 | Resp 18 | Ht 62.0 in | Wt 168.0 lb

## 2014-02-03 DIAGNOSIS — J45991 Cough variant asthma: Secondary | ICD-10-CM

## 2014-02-03 DIAGNOSIS — R7309 Other abnormal glucose: Secondary | ICD-10-CM

## 2014-02-03 DIAGNOSIS — R7302 Impaired glucose tolerance (oral): Secondary | ICD-10-CM

## 2014-02-03 DIAGNOSIS — J302 Other seasonal allergic rhinitis: Secondary | ICD-10-CM

## 2014-02-03 DIAGNOSIS — F329 Major depressive disorder, single episode, unspecified: Secondary | ICD-10-CM | POA: Diagnosis not present

## 2014-02-03 DIAGNOSIS — I10 Essential (primary) hypertension: Secondary | ICD-10-CM

## 2014-02-03 DIAGNOSIS — F3289 Other specified depressive episodes: Secondary | ICD-10-CM

## 2014-02-03 DIAGNOSIS — G894 Chronic pain syndrome: Secondary | ICD-10-CM

## 2014-02-03 DIAGNOSIS — J309 Allergic rhinitis, unspecified: Secondary | ICD-10-CM

## 2014-02-03 MED ORDER — CLONIDINE HCL 0.1 MG PO TABS: ORAL_TABLET | ORAL | Status: DC

## 2014-02-03 MED ORDER — TRIAMTERENE-HCTZ 75-50 MG PO TABS: 1.0000 | ORAL_TABLET | Freq: Every day | ORAL | Status: DC

## 2014-02-03 NOTE — Patient Instructions (Signed)
F/u in 4.5 weeks , call if you need me before  New dose of maxzide 75/50 one tablet once daily, OK to take TWO of your current dose 37.5/25 daily till done. Also new for blood pressure is clonidie one a bedtime, same time every night   Labs today cBC, chem 7 and EGFR, hBA1C and tSH  Try to get changed to nearer pain manegement specialist, 2 are in this [place] things improve soon

## 2014-02-03 NOTE — Progress Notes (Signed)
   Subjective:    Patient ID: Jane English, female    DOB: 1971-03-27, 43 y.o.   MRN: [id number]  HPI The PT is here for follow up and re-evaluation of chronic medical conditions, medication management and review of any available recent lab and radiology data.  Preventive health is updated, specifically  Cancer screening and Immunization.   Dissatisfied with current pain control and wants to get to a closer center. C/o increased financial stress, her adult children are always asking for money to help them, which she does not have, this is stressful, and she continues to learn how to live as a married woman with a blended family. Notes high blood pressures when she checks at home in recent times       Review of Systems See HPI Denies recent fever or chills. Denies sinus pressure, nasal congestion, ear pain or sore throat. Denies chest congestion, productive cough or wheezing. Denies chest pains, palpitations and leg swelling C/o chronic uncontrolled  abdominal pain, .   Denies dysuria, frequency, hesitancy or incontinence. Denies joint pain, swelling and limitation in mobility. Denies headaches, seizures, numbness, or tingling.  Denies skin break down or rash.        Objective:   Physical Exam BP 176/92  Pulse 82  Resp 18  Ht 5\' 2"  (1.575 m)  Wt 168 lb (76.204 kg)  BMI 30.72 kg/m2  SpO2 98% Patient alert and oriented and in no cardiopulmonary distress.  HEENT: No facial asymmetry, EOMI, no sinus tenderness,  oropharynx pink and moist.  Neck supple no adenopathy.  Chest: Clear to auscultation bilaterally.  CVS: S1, S2 no murmurs, no S3.  ABD: diffuse generalized superficial tenderness  Ext: No edema  MS: Adequate ROM spine, shoulders, hips and knees.  Skin: Intact, no ulcerations or rash noted.  Psych:Fairly  Good eye contact, depressed affect. Memory intact mildly anxious tearful and  depressed appearing.  CNS: CN 2-12 intact, power, tone and sensation normal  throughout.        Assessment & Plan:  ESSENTIAL HYPERTENSION Uncontrolled, increase in medication dosage. DASH diet and commitment to daily physical activity for a minimum of 30 minutes discussed and encouraged, as a part of hypertension management. The importance of attaining a healthy weight is also discussed. Needs to have improved stress management and pain control  Chronic pain syndrome states will not return to [facility] pain center, "they don't listen to me"  I have advised that she seek treatment within the county and this will need to be arranged by treating Doc  Will presumptively enter referral for local pain center assuminhg this needs to go through [place], but will hold the actual processing of referral until surgeon who made original referral is aware and agrees   Bipolar disorder, unspecified Describes increasing psychosocial family stressors with adult children, continue treatment through behavioral health. Not suicidal or homicidal but states "I'm tired"  Cough variant asthma Controlled, no change in medication   Seasonal allergies Controlled, no change in medication   IGT (impaired glucose tolerance) unchanged Patient educated about the importance of limiting  Carbohydrate intake , the need to commit to daily physical activity for a minimum of 30 minutes , and to commit weight loss. The fact that changes in all these areas will reduce or eliminate all together the development of diabetes is stressed.

## 2014-02-04 ENCOUNTER — Ambulatory Visit (HOSPITAL_COMMUNITY): Payer: Self-pay | Admitting: Psychiatry

## 2014-02-04 LAB — COMPLETE METABOLIC PANEL WITH GFR
ALT: 11 U/L (ref 0–35)
AST: 14 U/L (ref 0–37)
Albumin: 4.2 g/dL (ref 3.5–5.2)
Alkaline Phosphatase: 74 U/L (ref 39–117)
BILIRUBIN TOTAL: 0.2 mg/dL (ref 0.2–1.2)
BUN: 13 mg/dL (ref 6–23)
CO2: 28 mEq/L (ref 19–32)
CREATININE: 0.73 mg/dL (ref 0.50–1.10)
Calcium: 9.1 mg/dL (ref 8.4–10.5)
Chloride: 104 mEq/L (ref 96–112)
GFR, Est Non African American: >89 mL/min
Glucose, Bld: 94 mg/dL (ref 70–99)
Potassium: 3.5 mEq/L (ref 3.5–5.3)
Sodium: 144 mEq/L (ref 135–145)
Total Protein: 7 g/dL (ref 6.0–8.3)

## 2014-02-04 LAB — CBC
HCT: 30.5 % — ABNORMAL LOW (ref 36.0–46.0)
HEMOGLOBIN: 9.4 g/dL — AB (ref 12.0–15.0)
MCH: 24.2 pg — AB (ref 26.0–34.0)
MCHC: 30.8 g/dL (ref 30.0–36.0)
MCV: 78.4 fL (ref 78.0–100.0)
Platelets: 379 10*3/uL (ref 150–400)
RBC: 3.89 MIL/uL (ref 3.87–5.11)
RDW: 16.2 % — ABNORMAL HIGH (ref 11.5–15.5)
WBC: 4.9 10*3/uL (ref 4.0–10.5)

## 2014-02-04 LAB — TSH: TSH: 0.772 u[IU]/mL (ref 0.350–4.500)

## 2014-02-04 LAB — HEMOGLOBIN A1C
HEMOGLOBIN A1C: 5.7 % — AB (ref <5.7)
Mean Plasma Glucose: 117 mg/dL — ABNORMAL HIGH (ref <117)

## 2014-02-05 ENCOUNTER — Telehealth: Payer: Self-pay | Admitting: Family Medicine

## 2014-02-05 NOTE — Assessment & Plan Note (Signed)
Uncontrolled, increase in medication dosage. DASH diet and commitment to daily physical activity for a minimum of 30 minutes discussed and encouraged, as a part of hypertension management. The importance of attaining a healthy weight is also discussed. Needs to have improved stress management and pain control

## 2014-02-05 NOTE — Assessment & Plan Note (Addendum)
states will not return to [facility] pain center, "they don't listen to me"  I have advised that she seek treatment within the county and this will need to be arranged by treating Doc  Will presumptively enter referral for local pain center [place] this needs to go through [place], but will hold the actual processing of referral until surgeon who made original referral is aware and agrees

## 2014-02-05 NOTE — Telephone Encounter (Signed)
pls do not follow through on referral to pain center unless approved by Dr gross the surgeon who originally referred her to a pain center. I have sent him a message and ask that you call his office on 04/07 if I have not got an answer, so that we can move forward on the referral if he agrees

## 2014-02-05 NOTE — Assessment & Plan Note (Signed)
Describes increasing psychosocial family stressors with adult children, continue treatment through behavioral health. Not suicidal or homicidal but states "I'm tired"

## 2014-02-05 NOTE — Assessment & Plan Note (Signed)
Controlled, no change in medication  

## 2014-02-05 NOTE — Assessment & Plan Note (Signed)
unchanged Patient educated about the importance of limiting  Carbohydrate intake , the need to commit to daily physical activity for a minimum of 30 minutes , and to commit weight loss. The fact that changes in all these areas will reduce or eliminate all together the development of diabetes is stressed.    

## 2014-02-07 LAB — FERRITIN: FERRITIN: 6 ng/mL — AB (ref 10–291)

## 2014-02-07 LAB — IRON: IRON: 22 ug/dL — AB (ref 42–145)

## 2014-02-11 ENCOUNTER — Ambulatory Visit (HOSPITAL_COMMUNITY): Payer: Self-pay | Admitting: Psychiatry

## 2014-02-15 DIAGNOSIS — R109 Unspecified abdominal pain: Secondary | ICD-10-CM | POA: Diagnosis not present

## 2014-02-15 DIAGNOSIS — K660 Peritoneal adhesions (postprocedural) (postinfection): Secondary | ICD-10-CM | POA: Diagnosis not present

## 2014-02-15 DIAGNOSIS — R110 Nausea: Secondary | ICD-10-CM | POA: Diagnosis not present

## 2014-02-15 DIAGNOSIS — R141 Gas pain: Secondary | ICD-10-CM | POA: Diagnosis not present

## 2014-02-17 ENCOUNTER — Ambulatory Visit (INDEPENDENT_AMBULATORY_CARE_PROVIDER_SITE_OTHER): Payer: Medicare Other | Admitting: Psychiatry

## 2014-02-17 DIAGNOSIS — F4310 Post-traumatic stress disorder, unspecified: Secondary | ICD-10-CM | POA: Diagnosis not present

## 2014-02-17 DIAGNOSIS — F319 Bipolar disorder, unspecified: Secondary | ICD-10-CM

## 2014-02-17 DIAGNOSIS — R141 Gas pain: Secondary | ICD-10-CM | POA: Diagnosis not present

## 2014-02-17 DIAGNOSIS — R109 Unspecified abdominal pain: Secondary | ICD-10-CM | POA: Diagnosis not present

## 2014-02-17 DIAGNOSIS — R110 Nausea: Secondary | ICD-10-CM | POA: Diagnosis not present
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
## 2014-02-17 NOTE — Patient Instructions (Signed)
Discussed orally 

## 2014-02-18 ENCOUNTER — Encounter (HOSPITAL_COMMUNITY): Payer: Self-pay | Admitting: Psychiatry

## 2014-02-18 ENCOUNTER — Ambulatory Visit (INDEPENDENT_AMBULATORY_CARE_PROVIDER_SITE_OTHER): Payer: Medicare Other | Admitting: Psychiatry

## 2014-02-18 VITALS — BP 120/80 | Ht 62.0 in | Wt 162.0 lb

## 2014-02-18 DIAGNOSIS — F4310 Post-traumatic stress disorder, unspecified: Secondary | ICD-10-CM | POA: Diagnosis not present

## 2014-02-18 DIAGNOSIS — F319 Bipolar disorder, unspecified: Secondary | ICD-10-CM

## 2014-02-18 MED ORDER — CARBAMAZEPINE 100 MG PO CHEW: CHEWABLE_TABLET | ORAL | Status: DC

## 2014-02-18 MED ORDER — ALPRAZOLAM 1 MG PO TABS: 1.0000 mg | ORAL_TABLET | Freq: Two times a day (BID) | ORAL | Status: DC
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
## 2014-02-21 DIAGNOSIS — R141 Gas pain: Secondary | ICD-10-CM | POA: Diagnosis not present

## 2014-02-21 DIAGNOSIS — R110 Nausea: Secondary | ICD-10-CM | POA: Diagnosis not present

## 2014-02-21 DIAGNOSIS — R109 Unspecified abdominal pain: Secondary | ICD-10-CM | POA: Diagnosis not present

## 2014-02-21 DIAGNOSIS — D259 Leiomyoma of uterus, unspecified: Secondary | ICD-10-CM | POA: Diagnosis not present

## 2014-02-21 DIAGNOSIS — K660 Peritoneal adhesions (postprocedural) (postinfection): Secondary | ICD-10-CM | POA: Diagnosis not present

## 2014-02-24 ENCOUNTER — Ambulatory Visit (INDEPENDENT_AMBULATORY_CARE_PROVIDER_SITE_OTHER): Payer: Medicare Other | Admitting: Psychiatry

## 2014-02-24 DIAGNOSIS — F319 Bipolar disorder, unspecified: Secondary | ICD-10-CM

## 2014-02-28 ENCOUNTER — Telehealth (HOSPITAL_COMMUNITY): Payer: Self-pay | Admitting: *Deleted
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
## 2014-02-28 NOTE — Patient Instructions (Signed)
Discussed orally 

## 2014-03-01 ENCOUNTER — Telehealth (HOSPITAL_COMMUNITY): Payer: Self-pay | Admitting: *Deleted

## 2014-03-04 ENCOUNTER — Ambulatory Visit (HOSPITAL_COMMUNITY): Payer: Self-pay | Admitting: Psychiatry

## 2014-03-08 DIAGNOSIS — K660 Peritoneal adhesions (postprocedural) (postinfection): Secondary | ICD-10-CM | POA: Diagnosis not present

## 2014-03-08 DIAGNOSIS — G894 Chronic pain syndrome: Secondary | ICD-10-CM | POA: Diagnosis not present

## 2014-03-08 DIAGNOSIS — R109 Unspecified abdominal pain: Secondary | ICD-10-CM | POA: Diagnosis not present

## 2014-03-17 ENCOUNTER — Ambulatory Visit: Payer: Managed Care, Other (non HMO) | Admitting: Family Medicine

## 2014-03-18 ENCOUNTER — Ambulatory Visit (HOSPITAL_COMMUNITY): Payer: Self-pay | Admitting: Psychiatry

## 2014-03-30 ENCOUNTER — Other Ambulatory Visit: Payer: Self-pay

## 2014-03-30 ENCOUNTER — Other Ambulatory Visit: Payer: Self-pay | Admitting: Family Medicine

## 2014-03-30 DIAGNOSIS — M62838 Other muscle spasm: Secondary | ICD-10-CM

## 2014-03-30 MED ORDER — IBUPROFEN 800 MG PO TABS: 800.0000 mg | ORAL_TABLET | Freq: Three times a day (TID) | ORAL | Status: DC | PRN

## 2014-04-05 ENCOUNTER — Telehealth: Payer: Self-pay | Admitting: Family Medicine

## 2014-04-05 DIAGNOSIS — R109 Unspecified abdominal pain: Principal | ICD-10-CM

## 2014-04-05 DIAGNOSIS — G8929 Other chronic pain: Secondary | ICD-10-CM

## 2014-04-05 NOTE — Telephone Encounter (Signed)
States she currently sees Dr Laban Emperor in [place](?) and is tired of seeing different Dr's everytime she goes to them also and her fentanyl patches don't work and they gave her lyrica and she had a reaction to it. Wants to know if you will refer her for back, stomach and neck pain

## 2014-04-06 NOTE — Addendum Note (Signed)
Addended by: Eual Fines on: 04/06/2014 09:09 AM   Modules accepted: Orders

## 2014-04-06 NOTE — Telephone Encounter (Signed)
Referral entered  

## 2014-04-06 NOTE — Telephone Encounter (Signed)
pls enter referral to specified provider  Dx chronic abdominal pain, I will sign

## 2014-04-08 ENCOUNTER — Ambulatory Visit (INDEPENDENT_AMBULATORY_CARE_PROVIDER_SITE_OTHER): Payer: Managed Care, Other (non HMO) | Admitting: Psychiatry

## 2014-04-08 DIAGNOSIS — F319 Bipolar disorder, unspecified: Secondary | ICD-10-CM
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
## 2014-04-08 NOTE — Patient Instructions (Signed)
Discussed orally 

## 2014-04-15 DIAGNOSIS — R110 Nausea: Secondary | ICD-10-CM | POA: Diagnosis not present

## 2014-04-15 DIAGNOSIS — R634 Abnormal weight loss: Secondary | ICD-10-CM | POA: Diagnosis not present

## 2014-04-15 DIAGNOSIS — R109 Unspecified abdominal pain: Secondary | ICD-10-CM | POA: Diagnosis not present

## 2014-04-15 DIAGNOSIS — R143 Flatulence: Secondary | ICD-10-CM | POA: Diagnosis not present

## 2014-04-15 DIAGNOSIS — R141 Gas pain: Secondary | ICD-10-CM | POA: Diagnosis not present

## 2014-04-18 ENCOUNTER — Ambulatory Visit (INDEPENDENT_AMBULATORY_CARE_PROVIDER_SITE_OTHER): Payer: Managed Care, Other (non HMO) | Admitting: Psychiatry

## 2014-04-18 ENCOUNTER — Encounter (HOSPITAL_COMMUNITY): Payer: Self-pay | Admitting: Psychiatry

## 2014-04-18 VITALS — BP 130/80 | Ht 62.0 in | Wt 163.0 lb

## 2014-04-18 DIAGNOSIS — F319 Bipolar disorder, unspecified: Secondary | ICD-10-CM

## 2014-04-18 DIAGNOSIS — F4310 Post-traumatic stress disorder, unspecified: Secondary | ICD-10-CM

## 2014-04-18 MED ORDER — ALPRAZOLAM 1 MG PO TABS: 1.0000 mg | ORAL_TABLET | Freq: Two times a day (BID) | ORAL | Status: DC

## 2014-04-18 MED ORDER — CARBAMAZEPINE 100 MG PO CHEW: CHEWABLE_TABLET | ORAL | Status: DC
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
## 2014-04-22 ENCOUNTER — Ambulatory Visit (INDEPENDENT_AMBULATORY_CARE_PROVIDER_SITE_OTHER): Payer: Managed Care, Other (non HMO) | Admitting: Psychiatry

## 2014-04-22 DIAGNOSIS — F3130 Bipolar disorder, current episode depressed, mild or moderate severity, unspecified: Secondary | ICD-10-CM | POA: Diagnosis not present
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
## 2014-04-22 NOTE — Patient Instructions (Signed)
Discussed orally 

## 2014-04-29 ENCOUNTER — Encounter (HOSPITAL_COMMUNITY): Payer: Self-pay | Admitting: Psychiatry

## 2014-04-29 ENCOUNTER — Telehealth: Payer: Self-pay

## 2014-04-29 ENCOUNTER — Other Ambulatory Visit: Payer: Self-pay

## 2014-04-29 ENCOUNTER — Ambulatory Visit (HOSPITAL_COMMUNITY): Payer: Self-pay | Admitting: Psychiatry

## 2014-04-29 DIAGNOSIS — M62838 Other muscle spasm: Secondary | ICD-10-CM

## 2014-04-29 MED ORDER — IBUPROFEN 800 MG PO TABS: 800.0000 mg | ORAL_TABLET | Freq: Three times a day (TID) | ORAL | Status: AC | PRN

## 2014-04-29 NOTE — Telephone Encounter (Signed)
Entered historically pls send and let her know

## 2014-04-29 NOTE — Telephone Encounter (Signed)
Pt aware to check with the pharmacy later  

## 2014-04-29 NOTE — Telephone Encounter (Signed)
Wants a refill of ibuprofen 800mg . Not on current med list. Please advise

## 2014-05-04 ENCOUNTER — Ambulatory Visit: Payer: Managed Care, Other (non HMO) | Admitting: Family Medicine

## 2014-05-09 ENCOUNTER — Ambulatory Visit (HOSPITAL_COMMUNITY): Payer: Self-pay | Admitting: Psychiatry

## 2014-05-10 ENCOUNTER — Ambulatory Visit: Payer: Self-pay | Admitting: Family Medicine

## 2014-05-12 ENCOUNTER — Ambulatory Visit (INDEPENDENT_AMBULATORY_CARE_PROVIDER_SITE_OTHER): Payer: Managed Care, Other (non HMO) | Admitting: Family Medicine

## 2014-05-12 ENCOUNTER — Encounter: Payer: Self-pay | Admitting: Family Medicine

## 2014-05-12 VITALS — BP 130/82 | HR 70 | Resp 18 | Ht 62.0 in | Wt 161.0 lb

## 2014-05-12 DIAGNOSIS — J45991 Cough variant asthma: Secondary | ICD-10-CM | POA: Diagnosis not present

## 2014-05-12 DIAGNOSIS — F4320 Adjustment disorder, unspecified: Secondary | ICD-10-CM

## 2014-05-12 DIAGNOSIS — M5090 Cervical disc disorder, unspecified, unspecified cervical region: Secondary | ICD-10-CM

## 2014-05-12 DIAGNOSIS — R7302 Impaired glucose tolerance (oral): Secondary | ICD-10-CM

## 2014-05-12 DIAGNOSIS — F319 Bipolar disorder, unspecified: Secondary | ICD-10-CM

## 2014-05-12 DIAGNOSIS — I10 Essential (primary) hypertension: Secondary | ICD-10-CM | POA: Diagnosis not present

## 2014-05-12 DIAGNOSIS — J309 Allergic rhinitis, unspecified: Secondary | ICD-10-CM | POA: Diagnosis not present

## 2014-05-12 DIAGNOSIS — R1012 Left upper quadrant pain: Secondary | ICD-10-CM

## 2014-05-12 DIAGNOSIS — R7309 Other abnormal glucose: Secondary | ICD-10-CM | POA: Diagnosis not present

## 2014-05-12 DIAGNOSIS — F4321 Adjustment disorder with depressed mood: Secondary | ICD-10-CM
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
## 2014-05-12 NOTE — Patient Instructions (Addendum)
F/u in 3 month, call if you need me before  All the best with new pain specialist and Please ABSOLUTELY believe that better days are ahead.  Your  mental health is better  Surgery for your stomach is not indicated now.  I will see about getting help for neck and left shoulder

## 2014-05-13 DIAGNOSIS — M545 Low back pain, unspecified: Secondary | ICD-10-CM | POA: Diagnosis not present

## 2014-05-13 DIAGNOSIS — Z79899 Other long term (current) drug therapy: Secondary | ICD-10-CM | POA: Diagnosis not present

## 2014-05-13 DIAGNOSIS — Z5181 Encounter for therapeutic drug level monitoring: Secondary | ICD-10-CM | POA: Diagnosis not present

## 2014-05-13 DIAGNOSIS — R011 Cardiac murmur, unspecified: Secondary | ICD-10-CM | POA: Insufficient documentation

## 2014-05-14 DIAGNOSIS — F4321 Adjustment disorder with depressed mood: Secondary | ICD-10-CM | POA: Insufficient documentation

## 2014-05-14 NOTE — Assessment & Plan Note (Signed)
Clinically improved, continue management through mental health services

## 2014-05-14 NOTE — Assessment & Plan Note (Signed)
Patient educated about the importance of limiting  Carbohydrate intake , the need to commit to daily physical activity for a minimum of 30 minutes , and to commit weight loss. The fact that changes in all these areas will reduce or eliminate all together the development of diabetes is stressed.    

## 2014-05-14 NOTE — Assessment & Plan Note (Signed)
Controlled, no change in medication  

## 2014-05-14 NOTE — Assessment & Plan Note (Signed)
Reports that pain persists, has new pain specialist appt next week

## 2014-05-14 NOTE — Assessment & Plan Note (Addendum)
Pt currently dealing with recent death of a younger sibling which was unexpected, overall , appears to be handling this fairly well, although she does express significant anger at her sister's children, specifically denies any intent to hurt anyone , including herself

## 2014-05-14 NOTE — Assessment & Plan Note (Signed)
Controlled, no change in medication DASH diet and commitment to daily physical activity for a minimum of 30 minutes discussed and encouraged, as a part of hypertension management. The importance of attaining a healthy weight is also discussed.  

## 2014-05-14 NOTE — Assessment & Plan Note (Signed)
New c/o recurrent neck pain radiating down LUE, will refer to neurosurg for re eval and management, surgery a highly unlikely option and I am sure will be avoided unless absolutely necessary, pt also agrees with this approach

## 2014-05-24 DIAGNOSIS — K5900 Constipation, unspecified: Secondary | ICD-10-CM | POA: Diagnosis not present

## 2014-05-24 DIAGNOSIS — R109 Unspecified abdominal pain: Secondary | ICD-10-CM | POA: Diagnosis not present

## 2014-05-25 ENCOUNTER — Telehealth: Payer: Self-pay

## 2014-05-25 NOTE — Telephone Encounter (Signed)
Please advise 

## 2014-05-25 NOTE — Telephone Encounter (Signed)
Need medical info for  This, no leg swelling that I recall

## 2014-05-26 ENCOUNTER — Telehealth: Payer: Self-pay | Admitting: Family Medicine

## 2014-05-26 ENCOUNTER — Ambulatory Visit (HOSPITAL_COMMUNITY): Payer: Self-pay | Admitting: Psychiatry

## 2014-05-26 DIAGNOSIS — M5090 Cervical disc disorder, unspecified, unspecified cervical region: Secondary | ICD-10-CM

## 2014-05-26 NOTE — Telephone Encounter (Signed)
Patient states that she has swelling in legs after standing all day.  Would like to know if she can have compression hose for support.

## 2014-05-26 NOTE — Telephone Encounter (Signed)
Called and left message requesting more information.

## 2014-05-26 NOTE — Telephone Encounter (Signed)
Noted and agree. 

## 2014-05-26 NOTE — Telephone Encounter (Signed)
Verbal order given for 15-26mmHg. Script given to patient in hand

## 2014-05-26 NOTE — Telephone Encounter (Signed)
Pt is already seeing a pain specialist in [facility], she just asked to go to that Doc and has been ,  Pls refer her to the same Doc she is currently seeing for mangement of neck pain, let her know, I have entered the referral

## 2014-05-30 ENCOUNTER — Encounter (HOSPITAL_COMMUNITY): Payer: Self-pay | Admitting: Psychiatry

## 2014-05-30 ENCOUNTER — Ambulatory Visit (HOSPITAL_COMMUNITY): Payer: Self-pay | Admitting: Psychiatry

## 2014-06-13 ENCOUNTER — Ambulatory Visit (INDEPENDENT_AMBULATORY_CARE_PROVIDER_SITE_OTHER): Payer: Managed Care, Other (non HMO) | Admitting: Psychiatry

## 2014-06-13 DIAGNOSIS — F3130 Bipolar disorder, current episode depressed, mild or moderate severity, unspecified: Secondary | ICD-10-CM
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
## 2014-06-14 NOTE — Patient Instructions (Signed)
Discussed orally 

## 2014-06-17 ENCOUNTER — Other Ambulatory Visit: Payer: Self-pay

## 2014-06-17 ENCOUNTER — Telehealth: Payer: Self-pay | Admitting: *Deleted

## 2014-06-17 DIAGNOSIS — R8290 Unspecified abnormal findings in urine: Secondary | ICD-10-CM

## 2014-06-17 DIAGNOSIS — N949 Unspecified condition associated with female genital organs and menstrual cycle: Secondary | ICD-10-CM

## 2014-06-17 DIAGNOSIS — I10 Essential (primary) hypertension: Secondary | ICD-10-CM

## 2014-06-17 MED ORDER — FLUCONAZOLE 150 MG PO TABS: 150.0000 mg | ORAL_TABLET | Freq: Every day | ORAL | Status: DC

## 2014-06-17 MED ORDER — POTASSIUM CHLORIDE 20 MEQ/15ML (10%) PO SOLN: 10.0000 meq | Freq: Two times a day (BID) | ORAL | Status: DC

## 2014-06-17 NOTE — Telephone Encounter (Signed)
Pt called wanting to speak with Brandi per pt she is supposed to get test ran this morning pt wants to speak with Velna Hatchet about it she is having trouble with her urine and she also wants refill on her liquid potassium. Please advise [phone]

## 2014-06-17 NOTE — Telephone Encounter (Signed)
Pt aware and labs and med sent

## 2014-06-17 NOTE — Telephone Encounter (Signed)
Wants her potassium changed to the liquid form. Also for 2 weeks, her urine has had a really strong foul odor. No infections symptoms but she is having a lot of itching and irritation on the outside of her vagina. Please advise

## 2014-06-17 NOTE — Telephone Encounter (Signed)
I do not even see potassium on current med list, once you verify what she actually is taking OK to change to the same dose in liquid form, may need to document a reason, eg difficulty swallowing pills,see why she wants to change as may need PA. After you calculate the new script pls send info for me to review before sending I suggest fluconazole 150mg  #2 for presumed yeast infection, send in after you spk with her  Pls fax lab order for he to submit urine c/s only as well as chem 7 and HSV2 tesiting, explain neeeds these done absed on c/o itch and odor, the chem 7 dx is HTN and verify appropriate potassium dosing

## 2014-06-18 LAB — BASIC METABOLIC PANEL
BUN: 16 mg/dL (ref 6–23)
CALCIUM: 9.4 mg/dL (ref 8.4–10.5)
CO2: 26 mEq/L (ref 19–32)
Chloride: 100 mEq/L (ref 96–112)
Creat: 1.06 mg/dL (ref 0.50–1.10)
GLUCOSE: 93 mg/dL (ref 70–99)
Potassium: 3.3 mEq/L — ABNORMAL LOW (ref 3.5–5.3)
Sodium: 136 mEq/L (ref 135–145)

## 2014-06-19 ENCOUNTER — Other Ambulatory Visit: Payer: Self-pay | Admitting: Family Medicine

## 2014-06-20 ENCOUNTER — Other Ambulatory Visit: Payer: Self-pay | Admitting: Family Medicine

## 2014-06-20 LAB — URINE CULTURE

## 2014-06-21 ENCOUNTER — Other Ambulatory Visit: Payer: Self-pay | Admitting: Family Medicine

## 2014-06-21 ENCOUNTER — Telehealth: Payer: Self-pay | Admitting: Family Medicine

## 2014-06-21 LAB — HSV 2 ANTIBODY, IGG

## 2014-06-21 MED ORDER — ERYTHROMYCIN BASE 500 MG PO TABS: 500.0000 mg | ORAL_TABLET | Freq: Two times a day (BID) | ORAL | Status: DC

## 2014-06-21 MED ORDER — FLUCONAZOLE 150 MG PO TABS: ORAL_TABLET | ORAL | Status: DC

## 2014-06-21 NOTE — Telephone Encounter (Signed)
On reviewing result note, I did not make it clear that I had not discussed the potassium with [facility] .Please  Discuss the blood test and low potassium with her and addition of spironolactone with nurse bP check if not already done. I did NOT discuss that witheehr , only the UTI ( sorry about that)  ???pls ask

## 2014-06-22 ENCOUNTER — Other Ambulatory Visit: Payer: Self-pay

## 2014-06-22 MED ORDER — SPIRONOLACTONE 25 MG PO TABS: 25.0000 mg | ORAL_TABLET | Freq: Every day | ORAL | Status: DC

## 2014-06-22 NOTE — Telephone Encounter (Signed)
Patient aware of additional results.

## 2014-06-23 ENCOUNTER — Other Ambulatory Visit: Payer: Self-pay | Admitting: Internal Medicine

## 2014-06-23 ENCOUNTER — Telehealth: Payer: Self-pay | Admitting: Family Medicine

## 2014-06-23 NOTE — Telephone Encounter (Signed)
No openings , pls let her know (unfortunately I think he is the son of another sister of hers who passed just last week, she has lost 2 sisters in the past approx 6 weeks

## 2014-06-24 DIAGNOSIS — M67919 Unspecified disorder of synovium and tendon, unspecified shoulder: Secondary | ICD-10-CM | POA: Diagnosis not present

## 2014-06-24 DIAGNOSIS — M961 Postlaminectomy syndrome, not elsewhere classified: Secondary | ICD-10-CM | POA: Diagnosis not present

## 2014-06-24 DIAGNOSIS — M1710 Unilateral primary osteoarthritis, unspecified knee: Secondary | ICD-10-CM | POA: Diagnosis not present

## 2014-06-24 DIAGNOSIS — G8929 Other chronic pain: Secondary | ICD-10-CM | POA: Diagnosis not present

## 2014-06-24 DIAGNOSIS — IMO0002 Reserved for concepts with insufficient information to code with codable children: Secondary | ICD-10-CM | POA: Diagnosis not present

## 2014-06-24 DIAGNOSIS — M719 Bursopathy, unspecified: Secondary | ICD-10-CM | POA: Diagnosis not present

## 2014-06-29 NOTE — Telephone Encounter (Signed)
[facility] is aware

## 2014-07-01 ENCOUNTER — Ambulatory Visit (INDEPENDENT_AMBULATORY_CARE_PROVIDER_SITE_OTHER): Payer: Managed Care, Other (non HMO) | Admitting: Psychiatry

## 2014-07-01 DIAGNOSIS — F3130 Bipolar disorder, current episode depressed, mild or moderate severity, unspecified: Secondary | ICD-10-CM
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
## 2014-07-04 NOTE — Patient Instructions (Signed)
Discussed orally 

## 2014-07-15 ENCOUNTER — Ambulatory Visit (INDEPENDENT_AMBULATORY_CARE_PROVIDER_SITE_OTHER): Payer: Managed Care, Other (non HMO) | Admitting: Psychiatry

## 2014-07-15 DIAGNOSIS — F3130 Bipolar disorder, current episode depressed, mild or moderate severity, unspecified: Secondary | ICD-10-CM | POA: Diagnosis not present

## 2014-07-15 NOTE — Patient Instructions (Signed)
Discussed orally 
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
## 2014-07-19 ENCOUNTER — Other Ambulatory Visit: Payer: Self-pay | Admitting: Family Medicine

## 2014-07-19 ENCOUNTER — Encounter (HOSPITAL_COMMUNITY): Payer: Self-pay | Admitting: Psychiatry

## 2014-07-19 ENCOUNTER — Ambulatory Visit (INDEPENDENT_AMBULATORY_CARE_PROVIDER_SITE_OTHER): Payer: Managed Care, Other (non HMO) | Admitting: Psychiatry

## 2014-07-19 VITALS — BP 123/70 | HR 83 | Ht 62.0 in | Wt 154.8 lb

## 2014-07-19 DIAGNOSIS — F319 Bipolar disorder, unspecified: Secondary | ICD-10-CM | POA: Diagnosis not present

## 2014-07-19 DIAGNOSIS — F4310 Post-traumatic stress disorder, unspecified: Secondary | ICD-10-CM

## 2014-07-19 DIAGNOSIS — F3130 Bipolar disorder, current episode depressed, mild or moderate severity, unspecified: Secondary | ICD-10-CM

## 2014-07-19 MED ORDER — ESCITALOPRAM OXALATE 20 MG PO TABS: 20.0000 mg | ORAL_TABLET | Freq: Every day | ORAL | Status: DC

## 2014-07-19 MED ORDER — CARBAMAZEPINE 100 MG PO CHEW: CHEWABLE_TABLET | ORAL | Status: DC

## 2014-07-19 MED ORDER — ALPRAZOLAM 1 MG PO TABS: 1.0000 mg | ORAL_TABLET | Freq: Two times a day (BID) | ORAL | Status: DC
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
## 2014-07-22 ENCOUNTER — Ambulatory Visit (HOSPITAL_COMMUNITY): Payer: Self-pay | Admitting: Psychiatry

## 2014-08-02 ENCOUNTER — Telehealth: Payer: Self-pay | Admitting: Family Medicine

## 2014-08-02 DIAGNOSIS — Z79899 Other long term (current) drug therapy: Secondary | ICD-10-CM

## 2014-08-02 DIAGNOSIS — R7302 Impaired glucose tolerance (oral): Secondary | ICD-10-CM

## 2014-08-02 DIAGNOSIS — I10 Essential (primary) hypertension: Secondary | ICD-10-CM

## 2014-08-02 DIAGNOSIS — R7301 Impaired fasting glucose: Secondary | ICD-10-CM

## 2014-08-02 NOTE — Addendum Note (Signed)
Addended by: Eual Fines on: 08/02/2014 03:18 PM   Modules accepted: Orders

## 2014-08-02 NOTE — Telephone Encounter (Signed)
Pt needs fasting lipid, cmp and EGFr, CBc and hBa1C 3 to 5 days before her f/u , pls let her know her ins co has been in touch re need for  test to be done also, ps order tests, thanks DX iGT, dyslipidemia, anemia and HTN

## 2014-08-02 NOTE — Telephone Encounter (Signed)
Patient aware and order faxed to the lab

## 2014-08-05 ENCOUNTER — Ambulatory Visit (HOSPITAL_COMMUNITY): Payer: Self-pay | Admitting: Psychiatry

## 2014-08-10 LAB — COMPLETE METABOLIC PANEL WITH GFR
ALK PHOS: 54 U/L (ref 39–117)
ALT: 14 U/L (ref 0–35)
AST: 16 U/L (ref 0–37)
Albumin: 4.4 g/dL (ref 3.5–5.2)
BUN: 10 mg/dL (ref 6–23)
CALCIUM: 9.6 mg/dL (ref 8.4–10.5)
CHLORIDE: 100 meq/L (ref 96–112)
CO2: 29 mEq/L (ref 19–32)
CREATININE: 1.06 mg/dL (ref 0.50–1.10)
GFR, Est African American: 73 mL/min
GFR, Est Non African American: 64 mL/min
Glucose, Bld: 92 mg/dL (ref 70–99)
Potassium: 4.3 mEq/L (ref 3.5–5.3)
Sodium: 139 mEq/L (ref 135–145)
Total Bilirubin: 0.7 mg/dL (ref 0.2–1.2)
Total Protein: 7.5 g/dL (ref 6.0–8.3)

## 2014-08-10 LAB — CBC WITH DIFFERENTIAL/PLATELET
Basophils Absolute: 0 10*3/uL (ref 0.0–0.1)
Basophils Relative: 1 % (ref 0–1)
EOS PCT: 4 % (ref 0–5)
Eosinophils Absolute: 0.2 10*3/uL (ref 0.0–0.7)
HCT: 37.2 % (ref 36.0–46.0)
Hemoglobin: 12.6 g/dL (ref 12.0–15.0)
LYMPHS ABS: 1.8 10*3/uL (ref 0.7–4.0)
Lymphocytes Relative: 42 % (ref 12–46)
MCH: 29.4 pg (ref 26.0–34.0)
MCHC: 33.9 g/dL (ref 30.0–36.0)
MCV: 86.7 fL (ref 78.0–100.0)
Monocytes Absolute: 0.5 10*3/uL (ref 0.1–1.0)
Monocytes Relative: 12 % (ref 3–12)
NEUTROS PCT: 41 % — AB (ref 43–77)
Neutro Abs: 1.7 10*3/uL (ref 1.7–7.7)
Platelets: 348 10*3/uL (ref 150–400)
RBC: 4.29 MIL/uL (ref 3.87–5.11)
RDW: 15.8 % — ABNORMAL HIGH (ref 11.5–15.5)
WBC: 4.2 10*3/uL (ref 4.0–10.5)

## 2014-08-10 LAB — HEMOGLOBIN A1C
HEMOGLOBIN A1C: 6.2 % — AB (ref <5.7)
MEAN PLASMA GLUCOSE: 131 mg/dL — AB (ref <117)

## 2014-08-10 LAB — LIPID PANEL
CHOL/HDL RATIO: 2.5 ratio
CHOLESTEROL: 189 mg/dL (ref 0–200)
HDL: 75 mg/dL (ref >39)
LDL Cholesterol: 101 mg/dL — ABNORMAL HIGH (ref 0–99)
TRIGLYCERIDES: 67 mg/dL (ref <150)
VLDL: 13 mg/dL (ref 0–40)

## 2014-08-11 ENCOUNTER — Ambulatory Visit (INDEPENDENT_AMBULATORY_CARE_PROVIDER_SITE_OTHER): Payer: Managed Care, Other (non HMO) | Admitting: Psychiatry

## 2014-08-11 DIAGNOSIS — F3130 Bipolar disorder, current episode depressed, mild or moderate severity, unspecified: Secondary | ICD-10-CM

## 2014-08-12 ENCOUNTER — Encounter: Payer: Self-pay | Admitting: Family Medicine

## 2014-08-12 ENCOUNTER — Ambulatory Visit (INDEPENDENT_AMBULATORY_CARE_PROVIDER_SITE_OTHER): Payer: Managed Care, Other (non HMO) | Admitting: Family Medicine

## 2014-08-12 ENCOUNTER — Other Ambulatory Visit (HOSPITAL_COMMUNITY)
Admission: RE | Admit: 2014-08-12 | Discharge: 2014-08-12 | Disposition: A | Discharge status: Home / Self Care | Payer: Managed Care, Other (non HMO) | Source: Ambulatory Visit | Attending: Family Medicine | Admitting: Family Medicine

## 2014-08-12 VITALS — BP 108/74 | HR 85 | Resp 16 | Ht 62.0 in | Wt 152.1 lb

## 2014-08-12 DIAGNOSIS — N760 Acute vaginitis: Secondary | ICD-10-CM | POA: Diagnosis present

## 2014-08-12 DIAGNOSIS — J45991 Cough variant asthma: Secondary | ICD-10-CM

## 2014-08-12 DIAGNOSIS — Z113 Encounter for screening for infections with a predominantly sexual mode of transmission: Secondary | ICD-10-CM | POA: Diagnosis not present

## 2014-08-12 DIAGNOSIS — J3089 Other allergic rhinitis: Secondary | ICD-10-CM

## 2014-08-12 DIAGNOSIS — R51 Headache: Secondary | ICD-10-CM

## 2014-08-12 DIAGNOSIS — K219 Gastro-esophageal reflux disease without esophagitis: Secondary | ICD-10-CM

## 2014-08-12 DIAGNOSIS — F310 Bipolar disorder, current episode hypomanic: Secondary | ICD-10-CM

## 2014-08-12 DIAGNOSIS — G43711 Chronic migraine without aura, intractable, with status migrainosus: Secondary | ICD-10-CM

## 2014-08-12 DIAGNOSIS — R8290 Unspecified abnormal findings in urine: Secondary | ICD-10-CM

## 2014-08-12 DIAGNOSIS — R7302 Impaired glucose tolerance (oral): Secondary | ICD-10-CM

## 2014-08-12 DIAGNOSIS — I10 Essential (primary) hypertension: Secondary | ICD-10-CM

## 2014-08-12 DIAGNOSIS — Z23 Encounter for immunization: Secondary | ICD-10-CM | POA: Insufficient documentation

## 2014-08-12 DIAGNOSIS — R519 Headache, unspecified: Secondary | ICD-10-CM | POA: Insufficient documentation

## 2014-08-12 DIAGNOSIS — G43701 Chronic migraine without aura, not intractable, with status migrainosus: Secondary | ICD-10-CM

## 2014-08-12 LAB — POCT URINALYSIS DIPSTICK
Bilirubin, UA: NEGATIVE
Glucose, UA: NEGATIVE
Ketones, UA: NEGATIVE
LEUKOCYTES UA: NEGATIVE
NITRITE UA: NEGATIVE
PROTEIN UA: NEGATIVE
Spec Grav, UA: 1.02
UROBILINOGEN UA: 0.2
pH, UA: 7

## 2014-08-12 MED ORDER — TRIAMTERENE-HCTZ 37.5-25 MG PO TABS
ORAL_TABLET | ORAL | Status: DC
Start: 2014-08-12 — End: 2014-10-25

## 2014-08-12 NOTE — Patient Instructions (Signed)
Discussed orally 

## 2014-08-12 NOTE — Patient Instructions (Addendum)
F/U in 3 month, nurse bP check in 6 weeks  You are referred for a head scan, and will also be referred to headche clinic  Urine shows no infection  Labs are very good  Urine is not infected  Flu vaccine today  Continue with therapy and other support that you are getting  Dose of maxzide is reduced to  37/ 25 mg one daily, this is hALF the dose you currently take, oK to break maxzide that you already have in half, new script is for maxzide 25 mg ONE daily

## 2014-08-12 NOTE — Assessment & Plan Note (Signed)
Vaccine administered at visit.  
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
## 2014-08-14 DIAGNOSIS — R8290 Unspecified abnormal findings in urine: Secondary | ICD-10-CM | POA: Insufficient documentation

## 2014-08-14 NOTE — Assessment & Plan Note (Signed)
C/o uncontrolled daily headache for approx 3 months, will scan head and refer to neurology for management. Neuro exam is non focal, stress and anxiety are likely causing her migraine headaches to worsen
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
## 2014-08-14 NOTE — Assessment & Plan Note (Signed)
Controlled, no change in medication  

## 2014-08-14 NOTE — Assessment & Plan Note (Signed)
In office is negative for infection which is reassuring

## 2014-08-14 NOTE — Assessment & Plan Note (Signed)
Deterirorated, increased attention to diet needed, currently under excessive amount of mental stress , doing the "best she can"

## 2014-08-14 NOTE — Assessment & Plan Note (Signed)
Reports deterioration in her mood, she is more depressed, poor appetite and withdrawn, her marriage is her main challenge unsure about how viable  it is, feels as though she has  Been lied to about major issues, manly financial from the beginning, trying to work it out, states she wants to be a good example for her children, appreciated therapist and is treated by psychiatrist. Not suicidal or homicidal

## 2014-08-14 NOTE — Assessment & Plan Note (Signed)
C/o malodorous vaginal d/c , no recent sexual activity reportedly. Just finishing current cycle, specimens sent for testing, no significant cervical motion or adnexal tenderness on exam

## 2014-08-14 NOTE — Assessment & Plan Note (Addendum)
Over corrected, and pt c/o light headedness, reduce maxzide dose nurse BP check in 6 weeks

## 2014-08-15 LAB — CERVICOVAGINAL ANCILLARY ONLY
Wet Prep (BD Affirm): NEGATIVE
Wet Prep (BD Affirm): NEGATIVE
Wet Prep (BD Affirm): POSITIVE — AB

## 2014-08-16 ENCOUNTER — Other Ambulatory Visit: Payer: Self-pay | Admitting: Family Medicine

## 2014-08-16 ENCOUNTER — Ambulatory Visit (HOSPITAL_COMMUNITY): Payer: Medicare Other

## 2014-08-16 ENCOUNTER — Ambulatory Visit (HOSPITAL_COMMUNITY)
Admission: RE | Admit: 2014-08-16 | Discharge: 2014-08-16 | Disposition: A | Discharge status: Home / Self Care | Payer: Managed Care, Other (non HMO) | Source: Ambulatory Visit | Attending: Family Medicine | Admitting: Family Medicine

## 2014-08-16 DIAGNOSIS — R51 Headache: Secondary | ICD-10-CM | POA: Insufficient documentation

## 2014-08-16 DIAGNOSIS — R229 Localized swelling, mass and lump, unspecified: Principal | ICD-10-CM

## 2014-08-16 DIAGNOSIS — R42 Dizziness and giddiness: Secondary | ICD-10-CM | POA: Diagnosis not present

## 2014-08-16 DIAGNOSIS — N63 Unspecified lump in unspecified breast: Secondary | ICD-10-CM

## 2014-08-16 DIAGNOSIS — G43711 Chronic migraine without aura, intractable, with status migrainosus: Secondary | ICD-10-CM

## 2014-08-16 DIAGNOSIS — IMO0002 Reserved for concepts with insufficient information to code with codable children: Secondary | ICD-10-CM

## 2014-08-16 LAB — CERVICOVAGINAL ANCILLARY ONLY
CHLAMYDIA, DNA PROBE: NEGATIVE
Neisseria Gonorrhea: NEGATIVE

## 2014-08-16 MED ORDER — IOHEXOL 300 MG/ML  SOLN
75.0000 mL | Freq: Once | INTRAMUSCULAR | Status: AC | PRN
  Administered 2014-08-16: 75 mL via INTRAVENOUS

## 2014-08-17 ENCOUNTER — Encounter (HOSPITAL_COMMUNITY): Payer: Self-pay | Admitting: Psychiatry

## 2014-08-17 ENCOUNTER — Ambulatory Visit (INDEPENDENT_AMBULATORY_CARE_PROVIDER_SITE_OTHER): Payer: Managed Care, Other (non HMO) | Admitting: Psychiatry

## 2014-08-17 VITALS — BP 92/60 | Ht 62.0 in | Wt 156.0 lb

## 2014-08-17 DIAGNOSIS — F4310 Post-traumatic stress disorder, unspecified: Secondary | ICD-10-CM

## 2014-08-17 DIAGNOSIS — F3130 Bipolar disorder, current episode depressed, mild or moderate severity, unspecified: Secondary | ICD-10-CM

## 2014-08-17 DIAGNOSIS — F319 Bipolar disorder, unspecified: Secondary | ICD-10-CM

## 2014-08-17 MED ORDER — CARBAMAZEPINE 100 MG PO CHEW: CHEWABLE_TABLET | ORAL | Status: DC

## 2014-08-17 MED ORDER — ESCITALOPRAM OXALATE 20 MG PO TABS: 20.0000 mg | ORAL_TABLET | Freq: Every day | ORAL | Status: DC
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
## 2014-08-19 ENCOUNTER — Other Ambulatory Visit: Payer: Self-pay

## 2014-08-19 ENCOUNTER — Ambulatory Visit (INDEPENDENT_AMBULATORY_CARE_PROVIDER_SITE_OTHER): Payer: Managed Care, Other (non HMO) | Admitting: Neurology

## 2014-08-19 ENCOUNTER — Encounter: Payer: Self-pay | Admitting: Neurology

## 2014-08-19 VITALS — BP 93/60 | HR 61 | Ht 66.0 in | Wt 155.0 lb

## 2014-08-19 DIAGNOSIS — I10 Essential (primary) hypertension: Secondary | ICD-10-CM

## 2014-08-19 DIAGNOSIS — M961 Postlaminectomy syndrome, not elsewhere classified: Secondary | ICD-10-CM | POA: Diagnosis not present

## 2014-08-19 DIAGNOSIS — G8929 Other chronic pain: Secondary | ICD-10-CM | POA: Diagnosis not present

## 2014-08-19 DIAGNOSIS — M6250 Muscle wasting and atrophy, not elsewhere classified, unspecified site: Secondary | ICD-10-CM | POA: Diagnosis not present

## 2014-08-19 DIAGNOSIS — G43711 Chronic migraine without aura, intractable, with status migrainosus: Secondary | ICD-10-CM

## 2014-08-19 DIAGNOSIS — M25561 Pain in right knee: Secondary | ICD-10-CM | POA: Diagnosis not present

## 2014-08-19 DIAGNOSIS — F4310 Post-traumatic stress disorder, unspecified: Secondary | ICD-10-CM

## 2014-08-19 MED ORDER — TRAMADOL HCL 50 MG PO TABS: 50.0000 mg | ORAL_TABLET | Freq: Four times a day (QID) | ORAL | Status: DC | PRN

## 2014-08-19 MED ORDER — FLUCONAZOLE 150 MG PO TABS: 150.0000 mg | ORAL_TABLET | Freq: Once | ORAL | Status: DC

## 2014-08-19 MED ORDER — METRONIDAZOLE 500 MG PO TABS: 500.0000 mg | ORAL_TABLET | Freq: Two times a day (BID) | ORAL | Status: DC

## 2014-08-19 NOTE — Progress Notes (Signed)
PATIENT: Jane English DOB: 04-14-1971  HISTORICAL  Jane English is a 43 years old right-handed African American female, accompanied by her husband, referred by her primary care physician Tula Nakayama for evaluation of headaches  She has past medical history of bipolar disorder, on polypharmacy treatment, including carbamazepine, Xanax, she also has past medical history of hypertension, amlodipine, multiple surgery in the past, also complains of worsening mood disorder recently. She had a history of multiple lumbar surgeries, with left foot drop, mild gait difficulty, now she wears a left ankle brace, chronic low back pain, had a spinal stimulator placement.  She has a history of headaches since childhood, it was intermittent, unpeeled about 2011, she began to have frequent headaches, increase in 2 months, it has exacerbated to a daily basis, severe pounding, pressure headaches, holocranial, for a while, she has been taking daily Maxalt, without help, she used to take ibuprofen, Tylenol daily without helping her headache either.  Recent CAT scan of the brain with and without contrast was normal  Laboratory showed normal CMP, CBC.  REVIEW OF SYSTEMS: Full 14 system review of systems performed and notable only for fever, chill, weight loss, double vision, wheezing, feeling cold, joint pain, cramps, memory loss, headaches, numbness, weakness, dizziness, passing out, sleepiness, snoring, depression, anxiety, not enough sleep, disinterested in activities, racing thoughts.  ALLERGIES: Allergies  Allergen Reactions  . Amoxicillin Hives and Shortness Of Breath  . Neurontin [Gabapentin] Other (See Comments) and Nausea And Vomiting    Other reaction(s): Mental Status Changes (intolerance) hallucinations Sleep walking  . Penicillins Hives and Shortness Of Breath  . Pregabalin Diarrhea, Shortness Of Breath and Swelling  . Tramadol     delusional Other reaction(s): Delusions  (intolerance) delusional  . Latex Rash    HOME MEDICATIONS: Current Outpatient Prescriptions on File Prior to Visit  Medication Sig Dispense Refill  . albuterol (PROVENTIL) (2.5 MG/3ML) 0.083% nebulizer solution Take 3 mLs (2.5 mg total) by nebulization every 6 (six) hours as needed. Use one vial per nebulizer every 6 hours as needed for wheezing  75 mL  5  . ALPRAZolam (XANAX) 1 MG tablet Take 1 tablet (1 mg total) by mouth 2 (two) times daily.  60 tablet  2  . AMITIZA 24 MCG capsule TAKE ONE CAPSULE BY MOUTH TWICE A DAY WITH FOOD  60 capsule  3  . amLODipine (NORVASC) 10 MG tablet Take 10 mg by mouth every morning.      . beclomethasone (QVAR) 40 MCG/ACT inhaler Inhale 2 puffs into the lungs 2 (two) times daily.  1 Inhaler  12  . carbamazepine (TEGRETOL) 100 MG chewable tablet Take two in the am and three at night  150 tablet  2  . cetirizine (ZYRTEC) 10 MG tablet Take 10 mg by mouth daily.      . cloNIDine (CATAPRES) 0.1 MG tablet TAKE 1 TABLET AT BEDTIME FOR HYPERTENSION  30 tablet  3  . Elastic Bandages & Supports (ABDOMINAL BINDER/ELASTIC MED) MISC 1 application by Does not apply route daily.  1 each  0  . escitalopram (LEXAPRO) 20 MG tablet Take 1 tablet (20 mg total) by mouth daily.  30 tablet  2  . Fe Fum-FePoly-Vit C-Vit B3 (INTEGRA) 62.5-62.5-40-3 MG CAPS Take 1 capsule by mouth daily.  30 capsule  3  . fluconazole (DIFLUCAN) 150 MG tablet One tablet once on completion of antibiotic course Take on 06/26/2014  1 tablet  0  . fluticasone (FLONASE) 50 MCG/ACT nasal  spray Place 2 sprays into both nostrils daily.  16 g  3  . ipratropium (ATROVENT) 0.02 % nebulizer solution Take 2.5 mLs (500 mcg total) by nebulization every 6 (six) hours as needed. Use one vial per nebulizer every 6 hours as needed for wheezing  75 mL  5  . montelukast (SINGULAIR) 10 MG tablet take 1 tablet by mouth at bedtime  30 tablet  4  . olopatadine (PATANOL) 0.1 % ophthalmic solution Place 1 drop into both eyes 2  (two) times daily.  5 mL  3  . pantoprazole (PROTONIX) 40 MG tablet Take 40 mg by mouth daily.      . potassium chloride 20 MEQ/15ML (10%) SOLN Take 7.5 mLs (10 mEq total) by mouth 2 (two) times daily.  480 mL  3  . spironolactone (ALDACTONE) 25 MG tablet Take 1 tablet (25 mg total) by mouth daily.  30 tablet  5  . topiramate (TOPAMAX) 100 MG tablet TAKE 1 TABLET TWICE A DAY  60 tablet  3  . triamterene-hydrochlorothiazide (MAXZIDE-25) 37.5-25 MG per tablet One daily  Dose reduction effective 08/12/2014  30 tablet  3   No current facility-administered medications on file prior to visit.    PAST MEDICAL HISTORY: Past Medical History  Diagnosis Date  . Migraine headache   . Back pain   . Obesity   . Hypertension   . DVT (deep venous thrombosis) 2010  . Heart murmur     no cardiologist  . Asthma   . Bronchitis   . Depression   . Diabetes mellitus without complication   . Obsessive-compulsive disorder   . PTSD (post-traumatic stress disorder)   . Shortness of breath   . GERD (gastroesophageal reflux disease)   . Anemia   . PSYCHOTIC D/O W/HALLUCINATIONS CONDS CLASS ELSW 03/04/2010    Qualifier: Diagnosis of  By: Moshe Cipro MD, Joycelyn Schmid    . Asthma flare 04/09/2013  . Seasonal allergies 12/10/2012  . Chronic abdominal pain   . Chronic constipation   . Diabetes mellitus, type II   . SBO (small bowel obstruction) 08/09/2013  . Helicobacter pylori gastritis 06/11/2013    Colonoscopy Dr. Hilarie Fredrickson    PAST SURGICAL HISTORY: Past Surgical History  Procedure Laterality Date  . Appendectomy  1986  . Carpal tunnel release Bilateral   . Tubal ligation  1994  . Lumbar spine surgery  2010    x 3  . Lysis of adhesion  2003    Dr. Irving Shows  . Trigger finger release  2009    right pinkie finger  . Anterior cervical decomp/discectomy fusion  07/07/2012    Procedure: ANTERIOR CERVICAL DECOMPRESSION/DISCECTOMY FUSION 2 LEVELS;  Surgeon: Floyce Stakes, MD;  Location: [facility];  Service:  Neurosurgery;  Laterality: N/A;  Cervical four-five, five - six  Anterior cervical decompression/diskectomy/fusion/plate  . Partial hysterectomy  1990s?    [place], [place]  . Spinal cord stimulator implant    . Oophorectomy    . Laparoscopy N/A 07/29/2013    Procedure: diagnostic laporoscopy;  Surgeon: Adin Hector, MD;  Location: [facility];  Service: General;  Laterality: N/A;  . Lysis of adhesion N/A 07/29/2013    Procedure: LYSIS OF ADHESION;  Surgeon: Adin Hector, MD;  Location: [facility];  Service: General;  Laterality: N/A;  . Bowel resection N/A 07/29/2013    Procedure: serosal repair;  Surgeon: Adin Hector, MD;  Location: [facility];  Service: General;  Laterality: N/A;  . Laparoscopy N/A 08/16/2013  Procedure: LAPAROSCOPY DIAGNOSTIC/LYSIS OF ADHESIONS;  Surgeon: Adin Hector, MD;  Location: [facility];  Service: General;  Laterality: N/A;  . Laparotomy N/A 08/16/2013    Procedure: EXPLORATORY LAPAROTOMY/SMALL BOWEL RESECTION (JEJUNUM);  Surgeon: Adin Hector, MD;  Location: [facility];  Service: General;  Laterality: N/A;    FAMILY HISTORY: Family History  Problem Relation Age of Onset  . Lung cancer Father   . Stomach cancer Father   . Esophageal cancer Father   . Alcohol abuse Father   . Mental illness Father   . Diabetes Sister   . Hypertension Sister   . Bipolar disorder Sister   . Schizophrenia Sister   . Diabetes Sister   . Alcohol abuse Brother   . Hypertension Brother   . Kidney disease Brother   . Diabetes Brother   . Drug abuse Brother   . Mental illness Brother   . Alcohol abuse Brother   . Alcohol abuse Brother   . Hypertension Brother   . Diabetes Brother   . Alcohol abuse Brother   . Physical abuse Mother   . Alcohol abuse Mother   . Mental illness Brother     Deceased  . ADD / ADHD Neg Hx   . Anxiety disorder Neg Hx   . Dementia Neg Hx   . Depression Neg Hx   . OCD Neg Hx   . Seizures Neg Hx   . Paranoid behavior Neg Hx   . Drug abuse Sister    . Alcohol abuse Brother   . Colon cancer Neg Hx    SOCIAL HISTORY:  History   Social History  . Marital Status: Married    Spouse Name: N/A    4   . Years of Education: N/A   Occupational History  . disability   Social History Main Topics  . Smoking status: Never Smoker   . Smokeless tobacco: Never Used  . Alcohol Use: No  . Drug Use: No  . Sexual Activity: Yes   Other Topics Concern  . Not on file   Social History Narrative  . No narrative on file   PHYSICAL EXAM   Filed Vitals:   08/19/14 0808  BP: 93/60  Pulse: 61  Height: 5\' 6"  (1.676 m)  Weight: 155 lb (70.308 kg)    Not recorded    Body mass index is 25.03 kg/(m^2).   Generalized: In no acute distress  Neck: Supple, no carotid bruits   Cardiac: Regular rate rhythm  Pulmonary: Clear to auscultation bilaterally  Musculoskeletal: No deformity  Neurological examination  Mentation: Alert oriented to time, place, history taking, and causual conversation, depressed looking middle-age female  Cranial nerve II-XII: Pupils were equal round reactive to light. Extraocular movements were full.  Visual field were full on confrontational test. Bilateral fundi were sharp.  Facial sensation and strength were normal. Hearing was intact to finger rubbing bilaterally. Uvula tongue midline.  Head turning and shoulder shrug and were normal and symmetric.Tongue protrusion into cheek strength was normal.  Motor: Left ankle dorsi flexion weakness, whirring left ankle brace,.  Sensory: Intact to fine touch, pinprick, preserved vibratory sensation, and proprioception at toes.  Coordination: Normal finger to nose, heel-to-shin bilaterally there was no truncal ataxia  Gait: Rising up from seated position without assistance, normal stance, without trunk ataxia, moderate stride, good arm swing  Romberg signs: Negative  Deep tendon reflexes: Brachioradialis 2/2, biceps 2/2, triceps 2/2, patellar 2/2, Achilles absent,  plantar responses were flexor bilaterally.  DIAGNOSTIC DATA (LABS, IMAGING, TESTING) - I reviewed patient records, labs, notes, testing and imaging myself where available.  Lab Results  Component Value Date   WBC 4.2 08/10/2014   HGB 12.6 08/10/2014   HCT 37.2 08/10/2014   MCV 86.7 08/10/2014   PLT 348 08/10/2014      Component Value Date/Time   NA 139 08/10/2014 0804   K 4.3 08/10/2014 0804   CL 100 08/10/2014 0804   CO2 29 08/10/2014 0804   GLUCOSE 92 08/10/2014 0804   BUN 10 08/10/2014 0804   CREATININE 1.06 08/10/2014 0804   CREATININE 0.92 08/31/2013 0600   CALCIUM 9.6 08/10/2014 0804   PROT 7.5 08/10/2014 0804   ALBUMIN 4.4 08/10/2014 0804   AST 16 08/10/2014 0804   ALT 14 08/10/2014 0804   ALKPHOS 54 08/10/2014 0804   BILITOT 0.7 08/10/2014 0804   GFRNONAA 64 08/10/2014 0804   GFRNONAA 75* 08/31/2013 0600   GFRAA 73 08/10/2014 0804   GFRAA 87* 08/31/2013 0600   Lab Results  Component Value Date   CHOL 189 08/10/2014   HDL 75 08/10/2014   LDLCALC 101* 08/10/2014   TRIG 67 08/10/2014   CHOLHDL 2.5 08/10/2014   Lab Results  Component Value Date   HGBA1C 6.2* 08/10/2014   No results found for this basename: VITAMINB12   Lab Results  Component Value Date   TSH 0.772 02/03/2014      ASSESSMENT AND PLAN  Jane English is a 43 y.o. female with past medical history of mood disorder, hypertension, she is on polypharmacy treatment, now presenting with chronic headaches, with migraine features,  1, she is on polypharmacy already, including Topamax, amlodipine, Tegretol, 2, Botox injection would be a reasonable choice as migraine prevention 3. Tramadol as needed 4. Return to clinic in 2 weeks after Botox preauthorization   Marcial Pacas, M.D Ph.D.  [facility] [address] [place], [place] [address] [phone]

## 2014-08-24 ENCOUNTER — Encounter: Payer: Self-pay | Admitting: Neurology

## 2014-08-30 ENCOUNTER — Ambulatory Visit (HOSPITAL_COMMUNITY)
Admission: RE | Admit: 2014-08-30 | Discharge: 2014-08-30 | Disposition: A | Discharge status: Home / Self Care | Payer: Managed Care, Other (non HMO) | Source: Ambulatory Visit | Attending: Family Medicine | Admitting: Family Medicine

## 2014-08-30 DIAGNOSIS — N63 Unspecified lump in unspecified breast: Secondary | ICD-10-CM

## 2014-08-30 DIAGNOSIS — N644 Mastodynia: Secondary | ICD-10-CM | POA: Insufficient documentation

## 2014-09-08 ENCOUNTER — Ambulatory Visit (HOSPITAL_COMMUNITY): Payer: Self-pay | Admitting: Psychiatry

## 2014-09-21 ENCOUNTER — Ambulatory Visit (INDEPENDENT_AMBULATORY_CARE_PROVIDER_SITE_OTHER): Payer: Managed Care, Other (non HMO) | Admitting: Neurology

## 2014-09-21 DIAGNOSIS — F4310 Post-traumatic stress disorder, unspecified: Secondary | ICD-10-CM

## 2014-09-21 DIAGNOSIS — G43909 Migraine, unspecified, not intractable, without status migrainosus: Secondary | ICD-10-CM

## 2014-09-21 DIAGNOSIS — G43719 Chronic migraine without aura, intractable, without status migrainosus: Secondary | ICD-10-CM

## 2014-09-21 MED ORDER — ONABOTULINUMTOXINA 100 UNITS IJ SOLR
200.0000 [IU] | Freq: Once | INTRAMUSCULAR | Status: AC
  Administered 2014-09-21: 200 [IU] via INTRAMUSCULAR

## 2014-09-21 NOTE — Progress Notes (Signed)
PATIENT: Jane English DOB: Oct 01, 1971  HISTORICAL  Jane English is a 43 years old right-handed African American female, accompanied by her husband, referred by her primary care physician Tula Nakayama for evaluation of headaches  She has past medical history of bipolar disorder, on polypharmacy treatment, including carbamazepine, Xanax, she also has past medical history of hypertension, amlodipine, multiple surgery in the past, also complains of worsening mood disorder recently. She had a history of multiple lumbar surgeries, with left foot drop, mild gait difficulty, now she wears a left ankle brace, chronic low back pain, had a spinal stimulator placement.  She has a history of headaches since childhood, it was intermittent, until about 2011, she began to have frequent headaches, increase in recent 2 months, it has exacerbated to a daily basis, severe pounding, pressure headaches, holocranial, for a while, she has been taking daily Maxalt, without help, she used to take ibuprofen, Tylenol daily without helping her headache either.  Recent CAT scan of the brain with and without contrast was normal  Laboratory showed normal CMP, CBC  UPDATE Nov 18th 2015: This is her first Botox injection as migraine prevention, potential side effect explained, she voiced understanding, she continue have frequent headaches,she was also recently diagnosed with schizophrenia  REVIEW OF SYSTEMS: Full 14 system review of systems performed and notable only for as above ALLERGIES: Allergies  Allergen Reactions  . Amoxicillin Hives and Shortness Of Breath  . Neurontin [Gabapentin] Other (See Comments) and Nausea And Vomiting    Other reaction(s): Mental Status Changes (intolerance) hallucinations Sleep walking  . Penicillins Hives and Shortness Of Breath  . Pregabalin Diarrhea, Shortness Of Breath and Swelling  . Tramadol     delusional Other reaction(s): Delusions (intolerance) delusional  . Latex  Rash    HOME MEDICATIONS: Current Outpatient Prescriptions on File Prior to Visit  Medication Sig Dispense Refill  . albuterol (PROVENTIL) (2.5 MG/3ML) 0.083% nebulizer solution Take 3 mLs (2.5 mg total) by nebulization every 6 (six) hours as needed. Use one vial per nebulizer every 6 hours as needed for wheezing 75 mL 5  . ALPRAZolam (XANAX) 1 MG tablet Take 1 tablet (1 mg total) by mouth 2 (two) times daily. 60 tablet 2  . AMITIZA 24 MCG capsule TAKE ONE CAPSULE BY MOUTH TWICE A DAY WITH FOOD 60 capsule 3  . amLODipine (NORVASC) 10 MG tablet Take 10 mg by mouth every morning.    . beclomethasone (QVAR) 40 MCG/ACT inhaler Inhale 2 puffs into the lungs 2 (two) times daily. 1 Inhaler 12  . carbamazepine (TEGRETOL) 100 MG chewable tablet Take two in the am and three at night 150 tablet 2  . cetirizine (ZYRTEC) 10 MG tablet Take 10 mg by mouth daily.    . cloNIDine (CATAPRES) 0.1 MG tablet TAKE 1 TABLET AT BEDTIME FOR HYPERTENSION 30 tablet 3  . Elastic Bandages & Supports (ABDOMINAL BINDER/ELASTIC MED) MISC 1 application by Does not apply route daily. 1 each 0  . escitalopram (LEXAPRO) 20 MG tablet Take 1 tablet (20 mg total) by mouth daily. 30 tablet 2  . Fe Fum-FePoly-Vit C-Vit B3 (INTEGRA) 62.5-62.5-40-3 MG CAPS Take 1 capsule by mouth daily. 30 capsule 3  . fluconazole (DIFLUCAN) 150 MG tablet Take 1 tablet (150 mg total) by mouth once. 1 tablet 0  . fluticasone (FLONASE) 50 MCG/ACT nasal spray Place 2 sprays into both nostrils daily. 16 g 3  . ipratropium (ATROVENT) 0.02 % nebulizer solution Take 2.5 mLs (500 mcg  total) by nebulization every 6 (six) hours as needed. Use one vial per nebulizer every 6 hours as needed for wheezing 75 mL 5  . metroNIDAZOLE (FLAGYL) 500 MG tablet Take 1 tablet (500 mg total) by mouth 2 (two) times daily. 14 tablet 0  . montelukast (SINGULAIR) 10 MG tablet take 1 tablet by mouth at bedtime 30 tablet 4  . olopatadine (PATANOL) 0.1 % ophthalmic solution Place 1  drop into both eyes 2 (two) times daily. 5 mL 3  . pantoprazole (PROTONIX) 40 MG tablet Take 40 mg by mouth daily.    . potassium chloride 20 MEQ/15ML (10%) SOLN Take 7.5 mLs (10 mEq total) by mouth 2 (two) times daily. 480 mL 3  . spironolactone (ALDACTONE) 25 MG tablet Take 1 tablet (25 mg total) by mouth daily. 30 tablet 5  . topiramate (TOPAMAX) 100 MG tablet TAKE 1 TABLET TWICE A DAY 60 tablet 3  . traMADol (ULTRAM) 50 MG tablet Take 1 tablet (50 mg total) by mouth every 6 (six) hours as needed. 20 tablet 3  . triamterene-hydrochlorothiazide (MAXZIDE-25) 37.5-25 MG per tablet One daily  Dose reduction effective 08/12/2014 30 tablet 3   No current facility-administered medications on file prior to visit.    PAST MEDICAL HISTORY: Past Medical History  Diagnosis Date  . Migraine headache   . Back pain   . Obesity   . Hypertension   . DVT (deep venous thrombosis) 2010  . Heart murmur     no cardiologist  . Asthma   . Bronchitis   . Depression   . Diabetes mellitus without complication   . Obsessive-compulsive disorder   . PTSD (post-traumatic stress disorder)   . Shortness of breath   . GERD (gastroesophageal reflux disease)   . Anemia   . PSYCHOTIC D/O W/HALLUCINATIONS CONDS CLASS ELSW 03/04/2010    Qualifier: Diagnosis of  By: Moshe Cipro MD, Joycelyn Schmid    . Asthma flare 04/09/2013  . Seasonal allergies 12/10/2012  . Chronic abdominal pain   . Chronic constipation   . Diabetes mellitus, type II   . SBO (small bowel obstruction) 08/09/2013  . Helicobacter pylori gastritis 06/11/2013    Colonoscopy Dr. Hilarie Fredrickson    PAST SURGICAL HISTORY: Past Surgical History  Procedure Laterality Date  . Appendectomy  1986  . Carpal tunnel release Bilateral   . Tubal ligation  1994  . Lumbar spine surgery  2010    x 3  . Lysis of adhesion  2003    Dr. Irving Shows  . Trigger finger release  2009    right pinkie finger  . Anterior cervical decomp/discectomy fusion  07/07/2012    Procedure:  ANTERIOR CERVICAL DECOMPRESSION/DISCECTOMY FUSION 2 LEVELS;  Surgeon: Floyce Stakes, MD;  Location: [facility];  Service: Neurosurgery;  Laterality: N/A;  Cervical four-five, five - six  Anterior cervical decompression/diskectomy/fusion/plate  . Partial hysterectomy  1990s?    [place]  . Spinal cord stimulator implant    . Oophorectomy    . Laparoscopy N/A 07/29/2013    Procedure: diagnostic laporoscopy;  Surgeon: Adin Hector, MD;  Location: [facility];  Service: General;  Laterality: N/A;  . Lysis of adhesion N/A 07/29/2013    Procedure: LYSIS OF ADHESION;  Surgeon: Adin Hector, MD;  Location: [facility];  Service: General;  Laterality: N/A;  . Bowel resection N/A 07/29/2013    Procedure: serosal repair;  Surgeon: Adin Hector, MD;  Location: [facility];  Service: General;  Laterality: N/A;  .  Laparoscopy N/A 08/16/2013    Procedure: LAPAROSCOPY DIAGNOSTIC/LYSIS OF ADHESIONS;  Surgeon: Adin Hector, MD;  Location: [facility];  Service: General;  Laterality: N/A;  . Laparotomy N/A 08/16/2013    Procedure: EXPLORATORY LAPAROTOMY/SMALL BOWEL RESECTION (JEJUNUM);  Surgeon: Adin Hector, MD;  Location: [facility];  Service: General;  Laterality: N/A;    FAMILY HISTORY: Family History  Problem Relation Age of Onset  . Lung cancer Father   . Stomach cancer Father   . Esophageal cancer Father   . Alcohol abuse Father   . Mental illness Father   . Diabetes Sister   . Hypertension Sister   . Bipolar disorder Sister   . Schizophrenia Sister   . Diabetes Sister   . Alcohol abuse Brother   . Hypertension Brother   . Kidney disease Brother   . Diabetes Brother   . Drug abuse Brother   . Mental illness Brother   . Alcohol abuse Brother   . Alcohol abuse Brother   . Hypertension Brother   . Diabetes Brother   . Alcohol abuse Brother   . Physical abuse Mother   . Alcohol abuse Mother   . Mental illness Brother     Deceased  . ADD / ADHD Neg Hx   . Anxiety disorder Neg Hx   .  Dementia Neg Hx   . Depression Neg Hx   . OCD Neg Hx   . Seizures Neg Hx   . Paranoid behavior Neg Hx   . Drug abuse Sister   . Alcohol abuse Brother   . Colon cancer Neg Hx    SOCIAL HISTORY:  History   Social History  . Marital Status: Married    Spouse Name: N/A    4   . Years of Education: N/A   Occupational History  . disability   Social History Main Topics  . Smoking status: Never Smoker   . Smokeless tobacco: Never Used  . Alcohol Use: No  . Drug Use: No  . Sexual Activity: Yes   Other Topics Concern  . Not on file   Social History Narrative  . No narrative on file   PHYSICAL EXAM   Filed Vitals:    Not recorded      Cannot calculate BMI with a height equal to zero.   Generalized: In no acute distress  Neck: Supple, no carotid bruits   Cardiac: Regular rate rhythm  Pulmonary: Clear to auscultation bilaterally  Musculoskeletal: No deformity  Neurological examination  Mentation: Alert oriented to time, place, history taking, and causual conversation, depressed looking middle-age female  Cranial nerve II-XII: Pupils were equal round reactive to light. Extraocular movements were full.  Visual field were full on confrontational test. Bilateral fundi were sharp.  Facial sensation and strength were normal. Hearing was intact to finger rubbing bilaterally. Uvula tongue midline.  Head turning and shoulder shrug and were normal and symmetric.Tongue protrusion into cheek strength was normal.  Motor: Left ankle dorsi flexion weakness, whirring left ankle brace,.  Sensory: Intact to fine touch, pinprick, preserved vibratory sensation, and proprioception at toes.  Coordination: Normal finger to nose, heel-to-shin bilaterally there was no truncal ataxia  Gait: Rising up from seated position without assistance, normal stance, without trunk ataxia, moderate stride, good arm swing  Romberg signs: Negative  Deep tendon reflexes: Brachioradialis 2/2, biceps  2/2, triceps 2/2, patellar 2/2, Achilles absent, plantar responses were flexor bilaterally.   DIAGNOSTIC DATA (LABS, IMAGING, TESTING) - I reviewed patient records, labs,  notes, testing and imaging myself where available.  Lab Results  Component Value Date   WBC 4.2 08/10/2014   HGB 12.6 08/10/2014   HCT 37.2 08/10/2014   MCV 86.7 08/10/2014   PLT 348 08/10/2014      Component Value Date/Time   NA 139 08/10/2014 0804   K 4.3 08/10/2014 0804   CL 100 08/10/2014 0804   CO2 29 08/10/2014 0804   GLUCOSE 92 08/10/2014 0804   BUN 10 08/10/2014 0804   CREATININE 1.06 08/10/2014 0804   CREATININE 0.92 08/31/2013 0600   CALCIUM 9.6 08/10/2014 0804   PROT 7.5 08/10/2014 0804   ALBUMIN 4.4 08/10/2014 0804   AST 16 08/10/2014 0804   ALT 14 08/10/2014 0804   ALKPHOS 54 08/10/2014 0804   BILITOT 0.7 08/10/2014 0804   GFRNONAA 64 08/10/2014 0804   GFRNONAA 75* 08/31/2013 0600   GFRAA 73 08/10/2014 0804   GFRAA 87* 08/31/2013 0600   Lab Results  Component Value Date   CHOL 189 08/10/2014   HDL 75 08/10/2014   LDLCALC 101* 08/10/2014   TRIG 67 08/10/2014   CHOLHDL 2.5 08/10/2014   Lab Results  Component Value Date   HGBA1C 6.2* 08/10/2014   No results found for: VITAMINB12 Lab Results  Component Value Date   TSH 0.772 02/03/2014      ASSESSMENT AND PLAN  Bexley Laubach Alwin is a 43 y.o. female with past medical history of mood disorder, hypertension, she is on polypharmacy treatment, now presenting with chronic headaches, with migraine features, She is on polypharmacy already, including Topamax, amlodipine, Tegretol, recent diagnosis of schizophrenia, continue has frequent migraine headaches  BOTOX injection was performed according to protocol by [employer]. 100 units of BOTOX was dissolved into 2 cc NS. (Lot No.[id number]).  Total of 155 units, discard 45 units.  Corrugator 2 sites, 10 units Procerus 1 site, 5 unit Frontalis 4 sites,  20 units, Temporalis 8 sites,  40 units    Occipitalis 6 sites, 30 units Cervical Paraspinal, 4 sites, 20 units Trapezius, 6 sites, 30 units      Marcial Pacas, M.D Ph.D.  [facility] [address], [place], [place] [address] [phone]

## 2014-09-23 ENCOUNTER — Ambulatory Visit: Payer: Managed Care, Other (non HMO)

## 2014-09-23 ENCOUNTER — Ambulatory Visit (HOSPITAL_COMMUNITY): Payer: Self-pay | Admitting: Psychiatry

## 2014-09-23 VITALS — BP 104/68

## 2014-09-23 DIAGNOSIS — I10 Essential (primary) hypertension: Secondary | ICD-10-CM

## 2014-09-23 NOTE — Progress Notes (Signed)
Patient in for blood pressure check by nurse.   Blood pressure checked manually with reading of 104/68.  Patient aware to continue meds as she is currently doing.

## 2014-09-28 ENCOUNTER — Ambulatory Visit (INDEPENDENT_AMBULATORY_CARE_PROVIDER_SITE_OTHER): Payer: Managed Care, Other (non HMO) | Admitting: Psychiatry

## 2014-09-28 ENCOUNTER — Encounter (HOSPITAL_COMMUNITY): Payer: Self-pay | Admitting: Psychiatry

## 2014-09-28 VITALS — BP 130/72 | Ht 66.0 in | Wt 157.0 lb

## 2014-09-28 DIAGNOSIS — F3130 Bipolar disorder, current episode depressed, mild or moderate severity, unspecified: Secondary | ICD-10-CM

## 2014-09-28 DIAGNOSIS — F319 Bipolar disorder, unspecified: Secondary | ICD-10-CM

## 2014-09-28 DIAGNOSIS — F4310 Post-traumatic stress disorder, unspecified: Secondary | ICD-10-CM

## 2014-09-28 MED ORDER — ZOLPIDEM TARTRATE ER 12.5 MG PO TBCR: 12.5000 mg | EXTENDED_RELEASE_TABLET | Freq: Every evening | ORAL | Status: DC | PRN

## 2014-09-28 MED ORDER — ALPRAZOLAM 1 MG PO TABS: 1.0000 mg | ORAL_TABLET | Freq: Two times a day (BID) | ORAL | Status: DC

## 2014-09-28 MED ORDER — CARBAMAZEPINE 100 MG PO CHEW: CHEWABLE_TABLET | ORAL | Status: DC

## 2014-09-28 MED ORDER — ESCITALOPRAM OXALATE 20 MG PO TABS: 20.0000 mg | ORAL_TABLET | Freq: Every day | ORAL | Status: DC
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
## 2014-09-29 ENCOUNTER — Other Ambulatory Visit: Payer: Self-pay | Admitting: Family Medicine

## 2014-10-03 IMAGING — CT CT HEAD W/O CM
1 series · 16 of 30 positions shown, 20 images · non-contrast
Comparison: 12/11/2012

CLINICAL DATA: Back pain, headache

CT HEAD WITHOUT CONTRAST
TECHNIQUE: Contiguous axial images were obtained from the base of
the skull through the vertex without contrast.

[Series 3: head routine 4.8 h37s · axial · 0.43mm/px · z∈[-131,-0]mm · 16 of 30 slices shown, 20 images]
[im 2/30  brain]
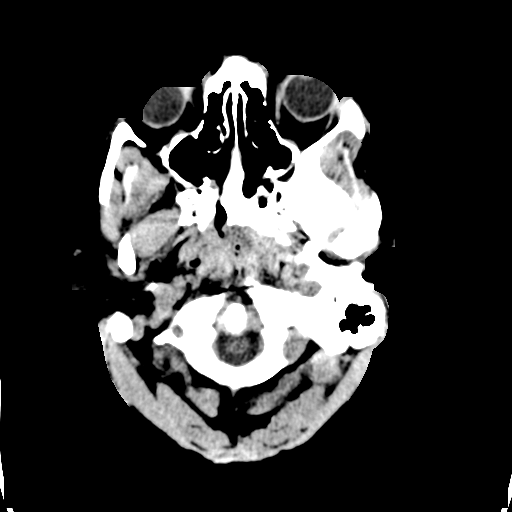
[im 2/30  bone]
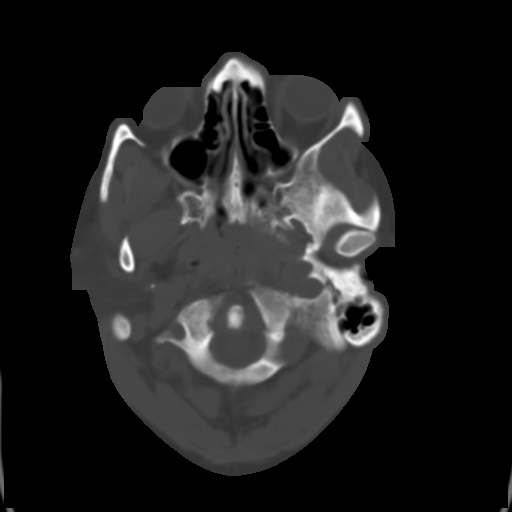
[im 4/30  brain]
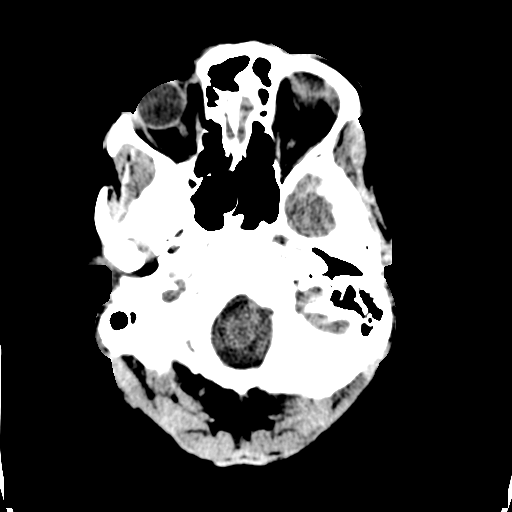
[im 6/30  brain]
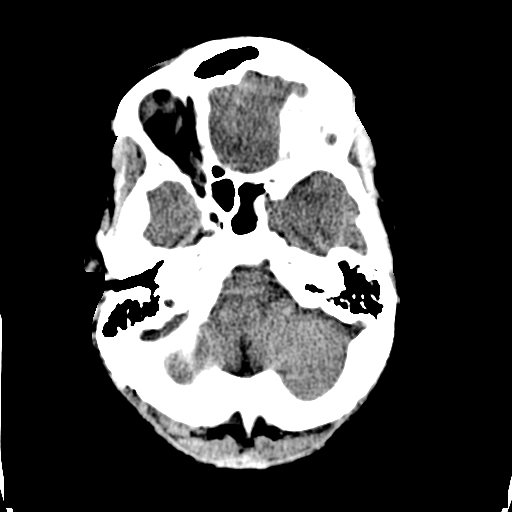
[im 8/30  brain]
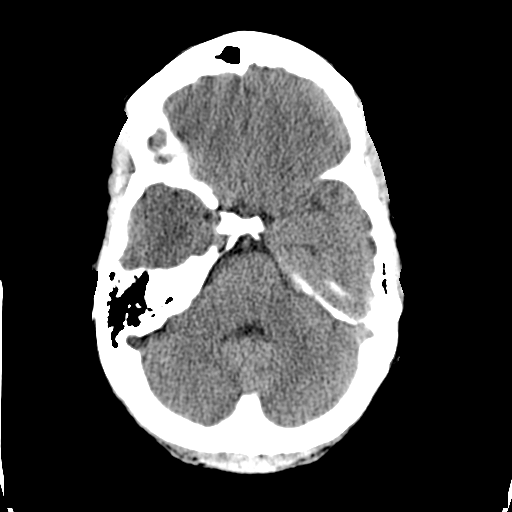
[im 9/30  brain]
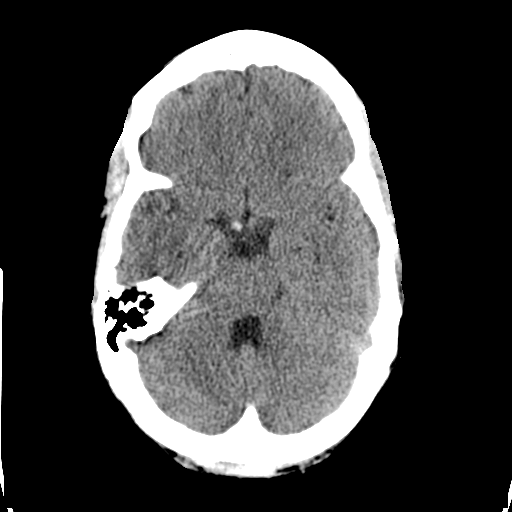
[im 9/30  bone]
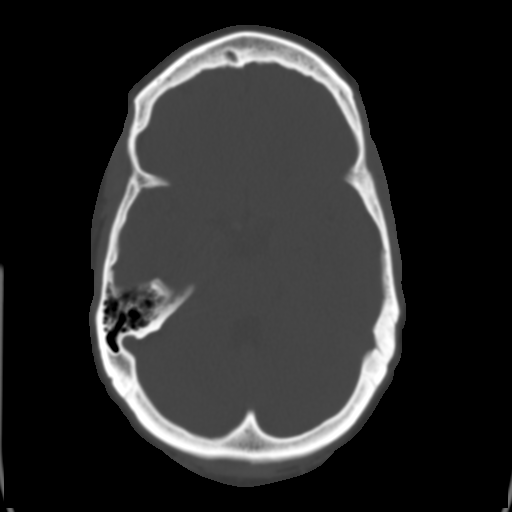
[im 11/30  brain]
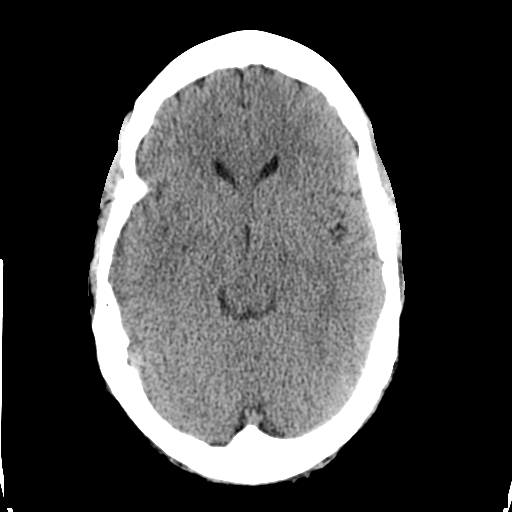
[im 13/30  brain]
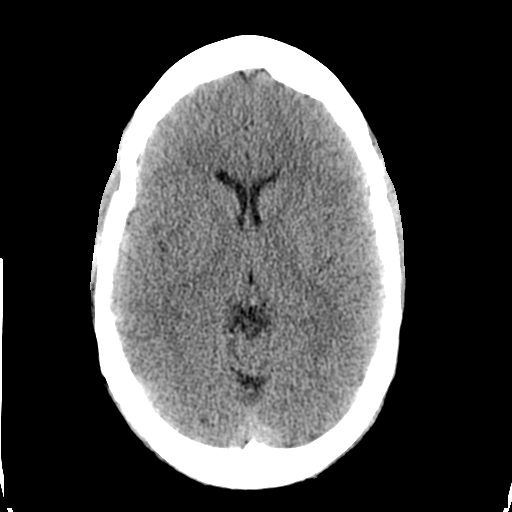
[im 15/30  brain]
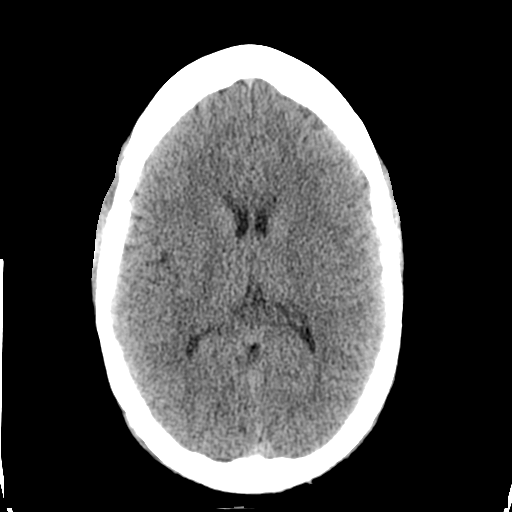
[im 16/30  brain]
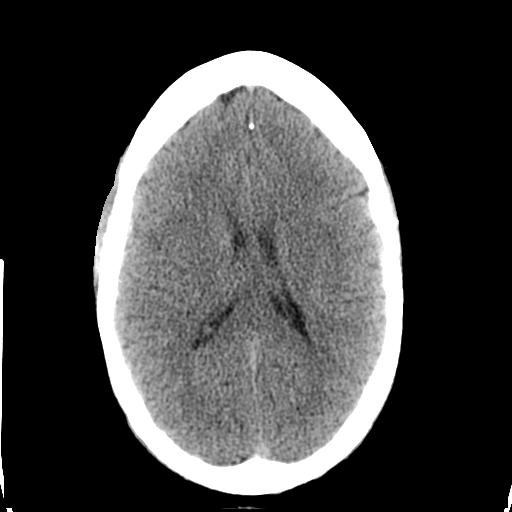
[im 16/30  bone]
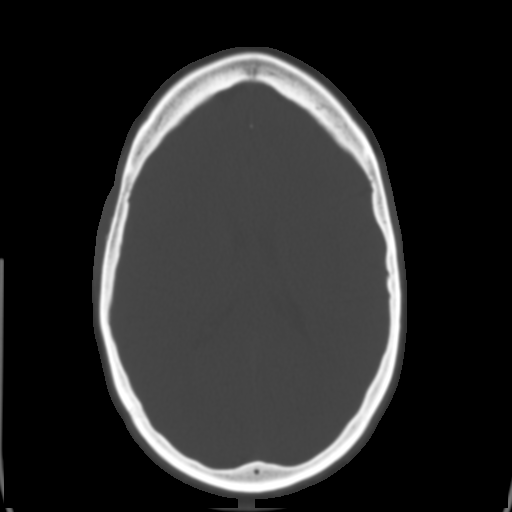
[im 18/30  brain]
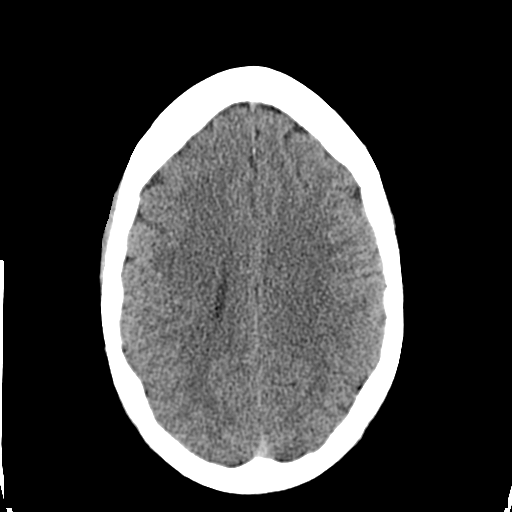
[im 20/30  brain]
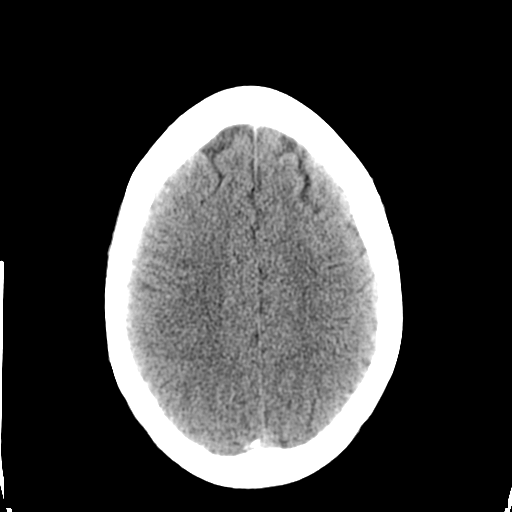
[im 22/30  brain]
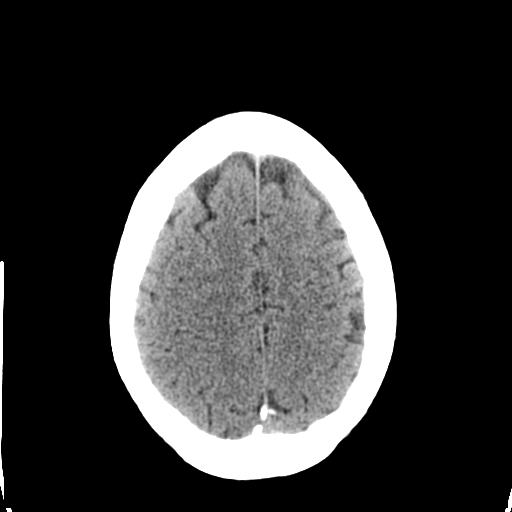
[im 23/30  brain]
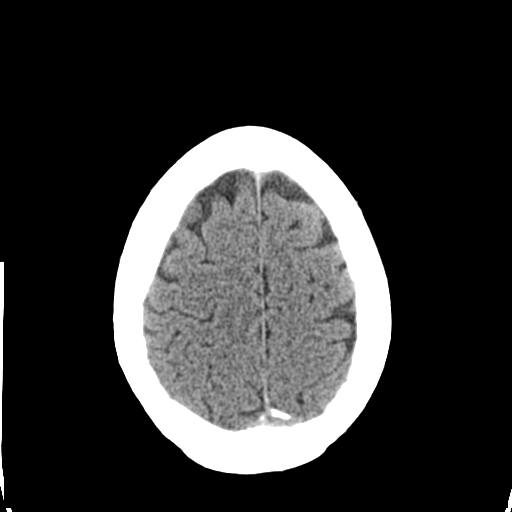
[im 23/30  bone]
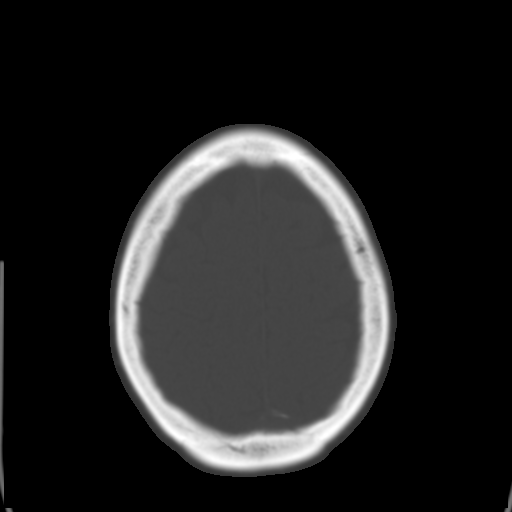
[im 25/30  brain]
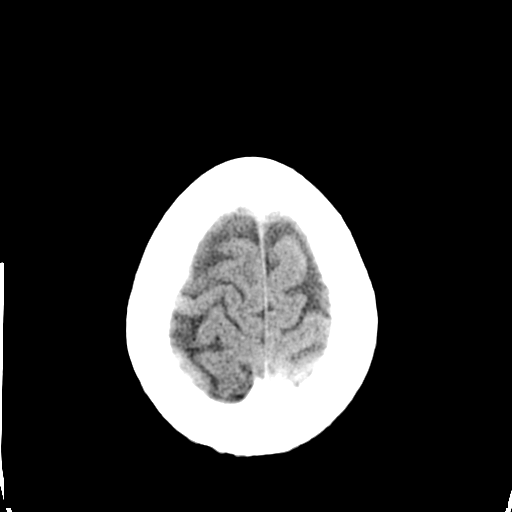
[im 27/30  brain]
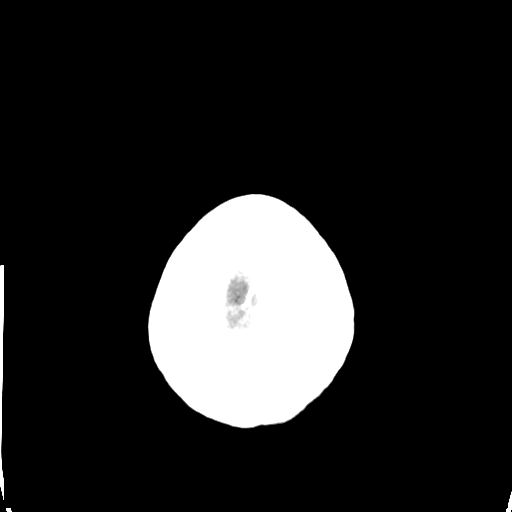
[im 29/30  brain]
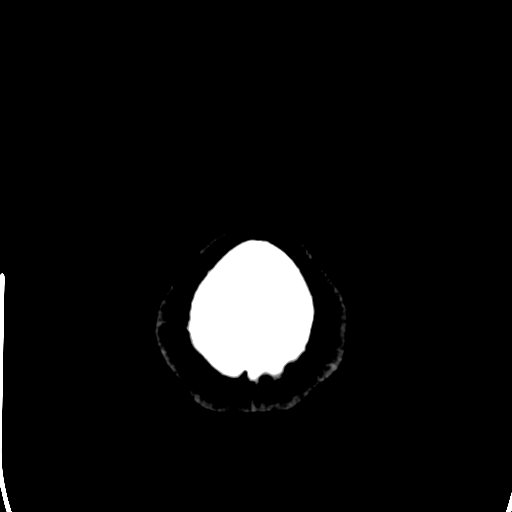

[16 of 30 positions shown; findings below may reference images not displayed]

FINDINGS: The brain has a normal appearance without evidence for
hemorrhage, infarction, hydrocephalus, or mass lesion.  There is no
extra axial fluid collection.  The skull and paranasal sinuses are
normal.
IMPRESSION: Normal exam.

## 2014-10-14 ENCOUNTER — Ambulatory Visit (HOSPITAL_COMMUNITY): Payer: Self-pay | Admitting: Psychiatry

## 2014-10-16 ENCOUNTER — Encounter (HOSPITAL_COMMUNITY): Payer: Self-pay

## 2014-10-16 ENCOUNTER — Emergency Department (HOSPITAL_COMMUNITY)
Admission: EM | Admit: 2014-10-16 | Discharge: 2014-10-17 | Disposition: A | Discharge status: Home / Self Care | Payer: Managed Care, Other (non HMO) | Attending: Emergency Medicine | Admitting: Emergency Medicine

## 2014-10-16 DIAGNOSIS — Z7951 Long term (current) use of inhaled steroids: Secondary | ICD-10-CM | POA: Insufficient documentation

## 2014-10-16 DIAGNOSIS — D649 Anemia, unspecified: Secondary | ICD-10-CM | POA: Insufficient documentation

## 2014-10-16 DIAGNOSIS — F419 Anxiety disorder, unspecified: Secondary | ICD-10-CM | POA: Diagnosis not present

## 2014-10-16 DIAGNOSIS — K5900 Constipation, unspecified: Secondary | ICD-10-CM | POA: Insufficient documentation

## 2014-10-16 DIAGNOSIS — E669 Obesity, unspecified: Secondary | ICD-10-CM | POA: Diagnosis not present

## 2014-10-16 DIAGNOSIS — G43909 Migraine, unspecified, not intractable, without status migrainosus: Secondary | ICD-10-CM | POA: Diagnosis not present

## 2014-10-16 DIAGNOSIS — Z86718 Personal history of other venous thrombosis and embolism: Secondary | ICD-10-CM | POA: Insufficient documentation

## 2014-10-16 DIAGNOSIS — Z79899 Other long term (current) drug therapy: Secondary | ICD-10-CM | POA: Insufficient documentation

## 2014-10-16 DIAGNOSIS — Z792 Long term (current) use of antibiotics: Secondary | ICD-10-CM | POA: Insufficient documentation

## 2014-10-16 DIAGNOSIS — Z8619 Personal history of other infectious and parasitic diseases: Secondary | ICD-10-CM | POA: Diagnosis not present

## 2014-10-16 DIAGNOSIS — R011 Cardiac murmur, unspecified: Secondary | ICD-10-CM | POA: Insufficient documentation

## 2014-10-16 DIAGNOSIS — G629 Polyneuropathy, unspecified: Secondary | ICD-10-CM | POA: Diagnosis not present

## 2014-10-16 DIAGNOSIS — Z880 Allergy status to penicillin: Secondary | ICD-10-CM | POA: Diagnosis not present

## 2014-10-16 DIAGNOSIS — F42 Obsessive-compulsive disorder: Secondary | ICD-10-CM | POA: Insufficient documentation

## 2014-10-16 DIAGNOSIS — I10 Essential (primary) hypertension: Secondary | ICD-10-CM | POA: Diagnosis not present

## 2014-10-16 DIAGNOSIS — G8929 Other chronic pain: Secondary | ICD-10-CM | POA: Diagnosis not present

## 2014-10-16 DIAGNOSIS — E876 Hypokalemia: Secondary | ICD-10-CM | POA: Insufficient documentation

## 2014-10-16 DIAGNOSIS — K219 Gastro-esophageal reflux disease without esophagitis: Secondary | ICD-10-CM | POA: Diagnosis not present

## 2014-10-16 DIAGNOSIS — J45901 Unspecified asthma with (acute) exacerbation: Secondary | ICD-10-CM | POA: Insufficient documentation

## 2014-10-16 DIAGNOSIS — Z91040 Latex allergy status: Secondary | ICD-10-CM | POA: Insufficient documentation

## 2014-10-16 DIAGNOSIS — E119 Type 2 diabetes mellitus without complications: Secondary | ICD-10-CM | POA: Diagnosis not present

## 2014-10-16 DIAGNOSIS — F4310 Post-traumatic stress disorder, unspecified: Secondary | ICD-10-CM | POA: Insufficient documentation

## 2014-10-16 DIAGNOSIS — F329 Major depressive disorder, single episode, unspecified: Secondary | ICD-10-CM | POA: Insufficient documentation

## 2014-10-16 DIAGNOSIS — R200 Anesthesia of skin: Secondary | ICD-10-CM | POA: Diagnosis present

## 2014-10-16 NOTE — ED Notes (Signed)
Pt complains of bilateral feet numbness, arm numbness and a burning feeling in her mouth, she states that she feels nauseated but hasn't vomited.

## 2014-10-16 NOTE — ED Notes (Signed)
Pt states that her mouth feels like she's getting thrush, she has a hx of the same.

## 2014-10-17 ENCOUNTER — Telehealth: Payer: Self-pay | Admitting: Family Medicine

## 2014-10-17 ENCOUNTER — Other Ambulatory Visit: Payer: Self-pay | Admitting: Family Medicine

## 2014-10-17 LAB — BASIC METABOLIC PANEL
Anion gap: 16 — ABNORMAL HIGH (ref 5–15)
BUN: 8 mg/dL (ref 6–23)
CO2: 22 meq/L (ref 19–32)
Calcium: 9.1 mg/dL (ref 8.4–10.5)
Chloride: 98 mEq/L (ref 96–112)
Creatinine, Ser: 0.77 mg/dL (ref 0.50–1.10)
GFR calc Af Amer: >90 mL/min (ref >90)
GLUCOSE: 93 mg/dL (ref 70–99)
POTASSIUM: 2.9 meq/L — AB (ref 3.7–5.3)
SODIUM: 136 meq/L — AB (ref 137–147)

## 2014-10-17 LAB — CBC
HCT: 33.2 % — ABNORMAL LOW (ref 36.0–46.0)
Hemoglobin: 10.8 g/dL — ABNORMAL LOW (ref 12.0–15.0)
MCH: 29.8 pg (ref 26.0–34.0)
MCHC: 32.5 g/dL (ref 30.0–36.0)
MCV: 91.5 fL (ref 78.0–100.0)
Platelets: 228 10*3/uL (ref 150–400)
RBC: 3.63 MIL/uL — AB (ref 3.87–5.11)
RDW: 13.6 % (ref 11.5–15.5)
WBC: 7.3 10*3/uL (ref 4.0–10.5)

## 2014-10-17 LAB — CBG MONITORING, ED: GLUCOSE-CAPILLARY: 91 mg/dL (ref 70–99)

## 2014-10-17 MED ORDER — ONDANSETRON 8 MG PO TBDP
8.0000 mg | ORAL_TABLET | Freq: Once | ORAL | Status: DC
  Filled 2014-10-17: qty 1

## 2014-10-17 MED ORDER — GI COCKTAIL ~~LOC~~: 30.0000 mL | Freq: Once | ORAL | Status: DC

## 2014-10-17 MED ORDER — POTASSIUM CHLORIDE CRYS ER 20 MEQ PO TBCR
40.0000 meq | EXTENDED_RELEASE_TABLET | Freq: Once | ORAL | Status: DC
  Filled 2014-10-17: qty 2

## 2014-10-17 MED ORDER — METOCLOPRAMIDE HCL 5 MG/ML IJ SOLN
10.0000 mg | Freq: Once | INTRAMUSCULAR | Status: AC
  Administered 2014-10-17: 10 mg via INTRAVENOUS
  Filled 2014-10-17: qty 2

## 2014-10-17 MED ORDER — METHOCARBAMOL 500 MG PO TABS
1000.0000 mg | ORAL_TABLET | Freq: Once | ORAL | Status: AC
  Administered 2014-10-17: 1000 mg via ORAL
  Filled 2014-10-17 (×2): qty 2

## 2014-10-17 MED ORDER — HYDROMORPHONE HCL 2 MG/ML IJ SOLN
2.0000 mg | Freq: Once | INTRAMUSCULAR | Status: AC
  Administered 2014-10-17: 2 mg via INTRAMUSCULAR
  Filled 2014-10-17: qty 1

## 2014-10-17 MED ORDER — DIPHENHYDRAMINE HCL 50 MG/ML IJ SOLN
25.0000 mg | Freq: Once | INTRAMUSCULAR | Status: AC
  Administered 2014-10-17: 25 mg via INTRAVENOUS
  Filled 2014-10-17: qty 1

## 2014-10-17 MED ORDER — GI COCKTAIL ~~LOC~~
30.0000 mL | Freq: Once | ORAL | Status: AC
  Administered 2014-10-17: 30 mL via ORAL
  Filled 2014-10-17: qty 30

## 2014-10-17 MED ORDER — POTASSIUM CHLORIDE 20 MEQ/15ML (10%) PO SOLN: 20.0000 meq | Freq: Two times a day (BID) | ORAL | Status: DC

## 2014-10-17 MED ORDER — LORAZEPAM 2 MG/ML IJ SOLN
1.0000 mg | Freq: Once | INTRAMUSCULAR | Status: AC
  Administered 2014-10-17: 1 mg via INTRAVENOUS
  Filled 2014-10-17: qty 1

## 2014-10-17 MED ORDER — POTASSIUM CHLORIDE 10 MEQ/100ML IV SOLN
10.0000 meq | INTRAVENOUS | Status: AC
  Administered 2014-10-17 (×2): 10 meq via INTRAVENOUS
  Filled 2014-10-17 (×2): qty 100

## 2014-10-17 NOTE — ED Notes (Signed)
Resting quietly with eye closed. Easily arousable. Verbally responsive. Resp even and unlabored. ABC's intact. NAD noted.  

## 2014-10-17 NOTE — ED Notes (Signed)
Critical value for Potasium which was 2.9 given to [facility] RN and OOTTER MD

## 2014-10-17 NOTE — ED Provider Notes (Signed)
CSN: [id number]     Arrival date & time 10/16/14  2329 History   First MD Initiated Contact with Patient 10/17/14 0036     Chief Complaint  Patient presents with  . Numbness     (Consider location/radiation/quality/duration/timing/severity/associated sxs/prior Treatment) HPI 43 year old female presents to the emergency department from home with complaint of burning and numbness to her bilateral lower legs in a stocking distribution calf down.  Symptoms have been ongoing for a little while, but then acutely worse over the last 3 days.  Patient had procedure done at her pain clinic to address pain in her right knee.  She reports prior injections to help with cartilage breakdown, but reports that she had a nerve injection at her knee on Friday and since that time has been feeling much worse.  Patient reports that she is shaky, with numbness going into her hands and up her neck.  She has had nausea.  She reports that she has a severe headache coming on.  Patient recently had a change in her Dyazide description.  She also reports she recently had multiple Botox injections to help with her chronic migraines.  No fevers or chills.  She reports a burning in the back of her throat that she associates with rash.  She denies previous history of neuropathy.  Remote history of diabetes, but is not currently a diabetic.  No other new medications.  Patient has neurostimulator back, but having any back pain at this time. Past Medical History  Diagnosis Date  . Migraine headache   . Back pain   . Obesity   . Hypertension   . DVT (deep venous thrombosis) 2010  . Heart murmur     no cardiologist  . Asthma   . Bronchitis   . Depression   . Diabetes mellitus without complication   . Obsessive-compulsive disorder   . PTSD (post-traumatic stress disorder)   . Shortness of breath   . GERD (gastroesophageal reflux disease)   . Anemia   . PSYCHOTIC D/O W/HALLUCINATIONS CONDS CLASS ELSW 03/04/2010    Qualifier:  Diagnosis of  By: Moshe Cipro MD, Joycelyn Schmid    . Asthma flare 04/09/2013  . Seasonal allergies 12/10/2012  . Chronic abdominal pain   . Chronic constipation   . Diabetes mellitus, type II   . SBO (small bowel obstruction) 08/09/2013  . Helicobacter pylori gastritis 06/11/2013    Colonoscopy Dr. Hilarie Fredrickson   Past Surgical History  Procedure Laterality Date  . Appendectomy  1986  . Carpal tunnel release Bilateral   . Tubal ligation  1994  . Lumbar spine surgery  2010    x 3  . Lysis of adhesion  2003    Dr. Irving Shows  . Trigger finger release  2009    right pinkie finger  . Anterior cervical decomp/discectomy fusion  07/07/2012    Procedure: ANTERIOR CERVICAL DECOMPRESSION/DISCECTOMY FUSION 2 LEVELS;  Surgeon: Floyce Stakes, MD;  Location: [facility];  Service: Neurosurgery;  Laterality: N/A;  Cervical four-five, five - six  Anterior cervical decompression/diskectomy/fusion/plate  . Partial hysterectomy  1990s?    [place]  . Spinal cord stimulator implant    . Oophorectomy    . Laparoscopy N/A 07/29/2013    Procedure: diagnostic laporoscopy;  Surgeon: Adin Hector, MD;  Location: [facility];  Service: General;  Laterality: N/A;  . Lysis of adhesion N/A 07/29/2013    Procedure: LYSIS OF ADHESION;  Surgeon: Adin Hector, MD;  Location: [facility];  Service:  General;  Laterality: N/A;  . Bowel resection N/A 07/29/2013    Procedure: serosal repair;  Surgeon: Adin Hector, MD;  Location: [facility];  Service: General;  Laterality: N/A;  . Laparoscopy N/A 08/16/2013    Procedure: LAPAROSCOPY DIAGNOSTIC/LYSIS OF ADHESIONS;  Surgeon: Adin Hector, MD;  Location: [facility];  Service: General;  Laterality: N/A;  . Laparotomy N/A 08/16/2013    Procedure: EXPLORATORY LAPAROTOMY/SMALL BOWEL RESECTION (JEJUNUM);  Surgeon: Adin Hector, MD;  Location: [facility];  Service: General;  Laterality: N/A;   Family History  Problem Relation Age of Onset  . Lung cancer Father   . Stomach cancer Father   .  Esophageal cancer Father   . Alcohol abuse Father   . Mental illness Father   . Diabetes Sister   . Hypertension Sister   . Bipolar disorder Sister   . Schizophrenia Sister   . Diabetes Sister   . Alcohol abuse Brother   . Hypertension Brother   . Kidney disease Brother   . Diabetes Brother   . Drug abuse Brother   . Mental illness Brother   . Alcohol abuse Brother   . Alcohol abuse Brother   . Hypertension Brother   . Diabetes Brother   . Alcohol abuse Brother   . Physical abuse Mother   . Alcohol abuse Mother   . Mental illness Brother     Deceased  . ADD / ADHD Neg Hx   . Anxiety disorder Neg Hx   . Dementia Neg Hx   . Depression Neg Hx   . OCD Neg Hx   . Seizures Neg Hx   . Paranoid behavior Neg Hx   . Drug abuse Sister   . Alcohol abuse Brother   . Colon cancer Neg Hx    History  Substance Use Topics  . Smoking status: Never Smoker   . Smokeless tobacco: Never Used  . Alcohol Use: No   OB History    No data available     Review of Systems   See History of Present Illness; otherwise all other systems are reviewed and negative  Allergies  Amoxicillin; Neurontin; Penicillins; Pregabalin; Tramadol; and Latex  Home Medications   Prior to Admission medications   Medication Sig Start Date End Date Taking? Authorizing Provider  albuterol (PROVENTIL) (2.5 MG/3ML) 0.083% nebulizer solution Take 3 mLs (2.5 mg total) by nebulization every 6 (six) hours as needed. Use one vial per nebulizer every 6 hours as needed for wheezing 01/04/14   Fayrene Helper, MD  ALPRAZolam Duanne Moron) 1 MG tablet Take 1 tablet (1 mg total) by mouth 2 (two) times daily. 09/28/14 09/28/15  Levonne Spiller, MD  AMITIZA 24 MCG capsule TAKE ONE CAPSULE BY MOUTH TWICE A DAY WITH FOOD 06/23/14   Jerene Bears, MD  amLODipine (NORVASC) 10 MG tablet Take 10 mg by mouth every morning.    Historical Provider, MD  beclomethasone (QVAR) 40 MCG/ACT inhaler Inhale 2 puffs into the lungs 2 (two) times daily.  01/07/14   Fayrene Helper, MD  carbamazepine (TEGRETOL) 100 MG chewable tablet Take two in the am and three at night 09/28/14   Levonne Spiller, MD  cetirizine (ZYRTEC) 10 MG tablet Take 10 mg by mouth daily.    Historical Provider, MD  cloNIDine (CATAPRES) 0.1 MG tablet TAKE 1 TABLET AT BEDTIME FOR HYPERTENSION 06/22/14   Fayrene Helper, MD  Elastic Bandages & Supports (ABDOMINAL BINDER/ELASTIC MED) MISC 1 application by Does not apply route  daily. 12/15/13   Jerene Bears, MD  escitalopram (LEXAPRO) 20 MG tablet Take 1 tablet (20 mg total) by mouth daily. 09/28/14 09/28/15  Levonne Spiller, MD  Fe Fum-FePoly-Vit C-Vit B3 (INTEGRA) 62.5-62.5-40-3 MG CAPS Take 1 capsule by mouth daily. 12/01/13   Jerene Bears, MD  fluconazole (DIFLUCAN) 150 MG tablet Take 1 tablet (150 mg total) by mouth once. 08/19/14   Fayrene Helper, MD  fluticasone (FLONASE) 50 MCG/ACT nasal spray Place 2 sprays into both nostrils daily. 11/18/13   Fayrene Helper, MD  ipratropium (ATROVENT) 0.02 % nebulizer solution Take 2.5 mLs (500 mcg total) by nebulization every 6 (six) hours as needed. Use one vial per nebulizer every 6 hours as needed for wheezing 01/04/14   Fayrene Helper, MD  metroNIDAZOLE (FLAGYL) 500 MG tablet Take 1 tablet (500 mg total) by mouth 2 (two) times daily. 08/19/14   Fayrene Helper, MD  montelukast (SINGULAIR) 10 MG tablet take 1 tablet by mouth at bedtime    Fayrene Helper, MD  montelukast (SINGULAIR) 10 MG tablet TAKE 1 TABLET (10 MG TOTAL) BY MOUTH AT BEDTIME. 10/03/14   Fayrene Helper, MD  olopatadine (PATANOL) 0.1 % ophthalmic solution Place 1 drop into both eyes 2 (two) times daily. 01/07/14   Fayrene Helper, MD  pantoprazole (PROTONIX) 40 MG tablet Take 40 mg by mouth daily.    Historical Provider, MD  potassium chloride 20 MEQ/15ML (10%) SOLN Take 7.5 mLs (10 mEq total) by mouth 2 (two) times daily. 06/17/14   Fayrene Helper, MD  spironolactone (ALDACTONE) 25 MG tablet Take 1  tablet (25 mg total) by mouth daily. 06/22/14   Fayrene Helper, MD  topiramate (TOPAMAX) 100 MG tablet TAKE 1 TABLET TWICE A DAY 11/18/13   Fayrene Helper, MD  traMADol (ULTRAM) 50 MG tablet Take 1 tablet (50 mg total) by mouth every 6 (six) hours as needed. 08/19/14   Marcial Pacas, MD  triamterene-hydrochlorothiazide [facility]) 37.5-25 MG per tablet One daily  Dose reduction effective 08/12/2014 08/12/14   Fayrene Helper, MD  zolpidem (AMBIEN CR) 12.5 MG CR tablet Take 1 tablet (12.5 mg total) by mouth at bedtime as needed for sleep. 09/28/14 09/28/15  Levonne Spiller, MD   BP 150/94 mmHg  Pulse 92  Temp(Src) 98.3 F (36.8 C) (Oral)  Resp 18  SpO2 100%  LMP 09/19/2014 Physical Exam  Constitutional: She is oriented to person, place, and time. She appears well-developed and well-nourished. She appears distressed (patient is anxious, tachypneic, pressured speech and animated).  HENT:  Head: Normocephalic and atraumatic.  Right Ear: External ear normal.  Left Ear: External ear normal.  Nose: Nose normal.  Mouth/Throat: Oropharynx is clear and moist.  No redness, no exudate, no signs of thrush  Eyes: Conjunctivae and EOM are normal. Pupils are equal, round, and reactive to light.  Neck: Normal range of motion. Neck supple. No JVD present. No tracheal deviation present. No thyromegaly present.  Cardiovascular: Normal rate, regular rhythm, normal heart sounds and intact distal pulses.  Exam reveals no gallop and no friction rub.   No murmur heard. Pulmonary/Chest: Breath sounds normal. No stridor. No respiratory distress. She has no wheezes. She has no rales. She exhibits no tenderness.  Patient with tachypnea, when asked to slow her breathing and calm herself.  This does improved  Abdominal: Soft. Bowel sounds are normal. She exhibits no distension and no mass. There is no tenderness. There is no rebound and no  guarding.  Musculoskeletal: Normal range of motion. She exhibits tenderness  (patient has diffuse tenderness to palpation of lower extremities, worse from knee distally.  Normal range of motion.  No effusion crepitus.  Normal palpation of pulses). She exhibits no edema.  Lymphadenopathy:    She has no cervical adenopathy.  Neurological: She is alert and oriented to person, place, and time. She displays normal reflexes. No cranial nerve deficit. She exhibits normal muscle tone. Coordination normal.  Patient reports decreased sensation from knee down, but a burning with any light touch  Skin: Skin is warm and dry. No rash noted. No erythema. No pallor.  Psychiatric: Her behavior is normal. Judgment and thought content normal.  Anxious  Nursing note and vitals reviewed.   ED Course  Procedures (including critical care time) Labs Review Labs Reviewed  BASIC METABOLIC PANEL - Abnormal; Notable for the following:    Sodium 136 (*)    Potassium 2.9 (*)    Anion gap 16 (*)    All other components within normal limits  CBC - Abnormal; Notable for the following:    RBC 3.63 (*)    Hemoglobin 10.8 (*)    HCT 33.2 (*)    All other components within normal limits  URINALYSIS, ROUTINE W REFLEX MICROSCOPIC  CBG MONITORING, ED    Imaging Review No results found.   EKG Interpretation None      MDM   Final diagnoses:  Hypokalemia  Peripheral neuropathy   42 year old female with multiple symptoms.  Patient has issues with chronic pain.  Her description of the burning and numbness seems to fit neuropathy, but I'm unsure of the cause.  We discussed controlling her pain and muscle spasms, obtaining lab work to see if there are any electrolyte abnormalities, and having her follow closely with her team of doctors.  Potassium noted to be low, patient reevaluated and is vomiting after the GI cocktail.  Will start IV, we will give Reglan Benadryl Ativan to help with her nausea and vomiting, and we'll give potassium and runs over an hour to help boost her potassium level.   Patient has already received Robaxin and Dilaudid.  Which I hope will help with her acute myalgic pain  5:35 AM Patient resting calmly.  Nausea and pain are improved.  She is receiving IV potassium.  Plan for 2 runs of potassium and discharged with oral.  6:31 AM Patient has completed both runs of potassium.  She is feeling much better.  Plan to discharge home.  She is to follow-up with her doctor this week for recheck of her potassium.  Kalman Drape, MD 10/17/14 [phone]

## 2014-10-17 NOTE — ED Notes (Signed)
Resting quietly with eye closed. Easily arousable. Verbally responsive. Resp even and unlabored. ABC's intact. No N/V noted. NAD noted.

## 2014-10-17 NOTE — ED Notes (Signed)
MD at bedside. 

## 2014-10-17 NOTE — ED Notes (Signed)
Pt unable to void at this time. 

## 2014-10-17 NOTE — ED Notes (Signed)
Awake. Verbally responsive. A/O x4. Resp even and unlabored. No audible adventitious breath sounds noted. ABC's intact.  

## 2014-10-17 NOTE — ED Notes (Addendum)
Awake. Verbally responsive. A/O x4. Resp even and unlabored. No audible adventitious breath sounds noted. ABC's intact.  

## 2014-10-17 NOTE — ED Notes (Addendum)
Pt reported receiving blockage shot to "bring the nerves back to life" on rt leg on Friday at neurologist office. Pt reported having mouth pain/rash, numbness/burning/swelling in bil lower legs, chest, and hands. "I think I might have had a reaction to the medication they given me".

## 2014-10-17 NOTE — Telephone Encounter (Signed)
States she can't take lyrica or gabapentin so I told her she would have to wait to discuss other options with dr Thursday

## 2014-10-17 NOTE — ED Notes (Signed)
Pt having active emesis. MD aware with new orders noted.

## 2014-10-17 NOTE — ED Notes (Signed)
Resting quietly with eye closed. Easily arousable. Verbally responsive. Resp even and unlabored. ABC's intact. IV completed infuse Potassium. NAD noted.

## 2014-10-17 NOTE — Discharge Instructions (Signed)
Please take medications as prescribed.  Follow-up with your Dr. for recheck of your potassium in 2-3 days.  Return to the emergency room for worsening condition or new concerning symptoms   Hypokalemia Hypokalemia means that the amount of potassium in the blood is lower than normal.Potassium is a chemical, called an electrolyte, that helps regulate the amount of fluid in the body. It also stimulates muscle contraction and helps nerves function properly.Most of the body's potassium is inside of cells, and only a very small amount is in the blood. Because the amount in the blood is so small, minor changes can be life-threatening. CAUSES  Antibiotics.  Diarrhea or vomiting.  Using laxatives too much, which can cause diarrhea.  Chronic kidney disease.  Water pills (diuretics).  Eating disorders (bulimia).  Low magnesium level.  Sweating a lot. SIGNS AND SYMPTOMS  Weakness.  Constipation.  Fatigue.  Muscle cramps.  Mental confusion.  Skipped heartbeats or irregular heartbeat (palpitations).  Tingling or numbness. DIAGNOSIS  Your health care provider can diagnose hypokalemia with blood tests. In addition to checking your potassium level, your health care provider may also check other lab tests. TREATMENT Hypokalemia can be treated with potassium supplements taken by mouth or adjustments in your current medicines. If your potassium level is very low, you may need to get potassium through a vein (IV) and be monitored in the hospital. A diet high in potassium is also helpful. Foods high in potassium are:  Nuts, such as peanuts and pistachios.  Seeds, such as sunflower seeds and pumpkin seeds.  Peas, lentils, and lima beans.  Whole grain and bran cereals and breads.  Fresh fruit and vegetables, such as apricots, avocado, bananas, cantaloupe, kiwi, oranges, tomatoes, asparagus, and potatoes.  Orange and tomato juices.  Red meats.  Fruit yogurt. HOME CARE  INSTRUCTIONS  Take all medicines as prescribed by your health care provider.  Maintain a healthy diet by including nutritious food, such as fruits, vegetables, nuts, whole grains, and lean meats.  If you are taking a laxative, be sure to follow the directions on the label. SEEK MEDICAL CARE IF:  Your weakness gets worse.  You feel your heart pounding or racing.  You are vomiting or having diarrhea.  You are diabetic and having trouble keeping your blood glucose in the normal range. SEEK IMMEDIATE MEDICAL CARE IF:  You have chest pain, shortness of breath, or dizziness.  You are vomiting or having diarrhea for more than 2 days.  You faint. MAKE SURE YOU:   Understand these instructions.  Will watch your condition.  Will get help right away if you are not doing well or get worse. Document Released: 10/21/2005 Document Revised: 08/11/2013 Document Reviewed: 04/23/2013 [facility] Patient Information 2015 [place], [place]. This information is not intended to replace advice given to you by your health care provider. Make sure you discuss any questions you have with your health care provider.  Peripheral Neuropathy Peripheral neuropathy is a type of nerve damage. It affects nerves that carry signals between the spinal cord and other parts of the body. These are called peripheral nerves. With peripheral neuropathy, one nerve or a group of nerves may be damaged.  CAUSES  Many things can damage peripheral nerves. For some people with peripheral neuropathy, the cause is unknown. Some causes include:  Diabetes. This is the most common cause of peripheral neuropathy.  Injury to a nerve.  Pressure or stress on a nerve that lasts a long time.  Too little vitamin B. Alcoholism  can lead to this.  Infections.  Autoimmune diseases, such as multiple sclerosis and systemic lupus erythematosus.  Inherited nerve diseases.  Some medicines, such as cancer drugs.  Toxic substances, such as  lead and mercury.  Too little blood flowing to the legs.  Kidney disease.  Thyroid disease. SIGNS AND SYMPTOMS  Different people have different symptoms. The symptoms you have will depend on which of your nerves is damaged. Common symptoms include:  Loss of feeling (numbness) in the feet and hands.  Tingling in the feet and hands.  Pain that burns.  Very sensitive skin.  Weakness.  Not being able to move a part of the body (paralysis).  Muscle twitching.  Clumsiness or poor coordination.  Loss of balance.  Not being able to control your bladder.  Feeling dizzy.  Sexual problems. DIAGNOSIS  Peripheral neuropathy is a symptom, not a disease. Finding the cause of peripheral neuropathy can be hard. To figure that out, your health care provider will take a medical history and do a physical exam. A neurological exam will also be done. This involves checking things affected by your brain, spinal cord, and nerves (nervous system). For example, your health care provider will check your reflexes, how you move, and what you can feel.  Other types of tests may also be ordered, such as:  Blood tests.  A test of the fluid in your spinal cord.  Imaging tests, such as CT scans or an MRI.  Electromyography (EMG). This test checks the nerves that control muscles.  Nerve conduction velocity tests. These tests check how fast messages pass through your nerves.  Nerve biopsy. A small piece of nerve is removed. It is then checked under a microscope. TREATMENT   Medicine is often used to treat peripheral neuropathy. Medicines may include:  Pain-relieving medicines. Prescription or over-the-counter medicine may be suggested.  Antiseizure medicine. This may be used for pain.  Antidepressants. These also may help ease pain from neuropathy.  Lidocaine. This is a numbing medicine. You might wear a patch or be given a shot.  Mexiletine. This medicine is typically used to help control  irregular heart rhythms.  Surgery. Surgery may be needed to relieve pressure on a nerve or to destroy a nerve that is causing pain.  Physical therapy to help movement.  Assistive devices to help movement. HOME CARE INSTRUCTIONS   Only take over-the-counter or prescription medicines as directed by your health care provider. Follow the instructions carefully for any given medicines. Do not take any other medicines without first getting approval from your health care provider.  If you have diabetes, work closely with your health care provider to keep your blood sugar under control.  If you have numbness in your feet:  Check every day for signs of injury or infection. Watch for redness, warmth, and swelling.  Wear padded socks and comfortable shoes. These help protect your feet.  Do not do things that put pressure on your damaged nerve.  Do not smoke. Smoking keeps blood from getting to damaged nerves.  Avoid or limit alcohol. Too much alcohol can cause a lack of B vitamins. These vitamins are needed for healthy nerves.  Develop a good support system. Coping with peripheral neuropathy can be stressful. Talk to a mental health specialist or join a support group if you are struggling.  Follow up with your health care provider as directed. SEEK MEDICAL CARE IF:   You have new signs or symptoms of peripheral neuropathy.  You are struggling  emotionally from dealing with peripheral neuropathy.  You have a fever. SEEK IMMEDIATE MEDICAL CARE IF:   You have an injury or infection that is not healing.  You feel very dizzy or begin vomiting.  You have chest pain.  You have trouble breathing. Document Released: 10/11/2002 Document Revised: 07/03/2011 Document Reviewed: 06/28/2013 [name] Patient Information 2015 [place], [place]. This information is not intended to replace advice given to you by your health care provider. Make sure you discuss any questions you have with your health care  provider.

## 2014-10-19 ENCOUNTER — Telehealth: Payer: Self-pay | Admitting: Family Medicine

## 2014-10-19 NOTE — Telephone Encounter (Signed)
Wanted to know date of last pap. Patient aware

## 2014-10-19 NOTE — Telephone Encounter (Signed)
Called patient and left message for them to return call at the office   

## 2014-10-20 ENCOUNTER — Ambulatory Visit (INDEPENDENT_AMBULATORY_CARE_PROVIDER_SITE_OTHER): Payer: Managed Care, Other (non HMO) | Admitting: Family Medicine

## 2014-10-20 ENCOUNTER — Encounter: Payer: Self-pay | Admitting: Family Medicine

## 2014-10-20 ENCOUNTER — Ambulatory Visit: Payer: Managed Care, Other (non HMO) | Admitting: Family Medicine

## 2014-10-20 VITALS — BP 128/80 | HR 87 | Resp 16 | Ht 66.0 in | Wt 157.0 lb

## 2014-10-20 DIAGNOSIS — M791 Myalgia, unspecified site: Secondary | ICD-10-CM

## 2014-10-20 DIAGNOSIS — R7301 Impaired fasting glucose: Secondary | ICD-10-CM

## 2014-10-20 DIAGNOSIS — I10 Essential (primary) hypertension: Secondary | ICD-10-CM

## 2014-10-20 DIAGNOSIS — E876 Hypokalemia: Secondary | ICD-10-CM

## 2014-10-20 NOTE — Patient Instructions (Addendum)
Annual physical exam in 2.5 month, call if you need me before   ENSURE you take 3 teaspoon (15 ml) twice daily and spironolactone 25 mg once daily, this is for your potassium  Chem 7 , calcium and magnesium today and hBa1C TODAY

## 2014-10-21 ENCOUNTER — Encounter: Payer: Self-pay | Admitting: Family Medicine

## 2014-10-21 LAB — BASIC METABOLIC PANEL
BUN: 13 mg/dL (ref 6–23)
CHLORIDE: 100 meq/L (ref 96–112)
CO2: 29 mEq/L (ref 19–32)
Calcium: 8.8 mg/dL (ref 8.4–10.5)
Creat: 0.94 mg/dL (ref 0.50–1.10)
Glucose, Bld: 84 mg/dL (ref 70–99)
Potassium: 3.9 mEq/L (ref 3.5–5.3)
Sodium: 138 mEq/L (ref 135–145)

## 2014-10-21 LAB — MAGNESIUM: MAGNESIUM: 1.7 mg/dL (ref 1.5–2.5)

## 2014-10-21 LAB — HEMOGLOBIN A1C
Hgb A1c MFr Bld: 6 % — ABNORMAL HIGH (ref <5.7)
Mean Plasma Glucose: 126 mg/dL — ABNORMAL HIGH (ref <117)

## 2014-10-21 LAB — CALCIUM: Calcium: 8.8 mg/dL (ref 8.4–10.5)

## 2014-10-21 IMAGING — US US EXTREM LOW VENOUS*R*
1 series · 14 of 24 positions shown · non-contrast
Comparison: none

Right lower extremity venous duplex ultrasound
HISTORY: Right lower extremity swelling
TECHNIQUE: Real-time and Doppler interrogation of the right lower
extremity venous system was performed.

[Series 1: us extrem low venous*right* · 0.07mm/px · 14 of 45 slices shown]
[im 1/45]
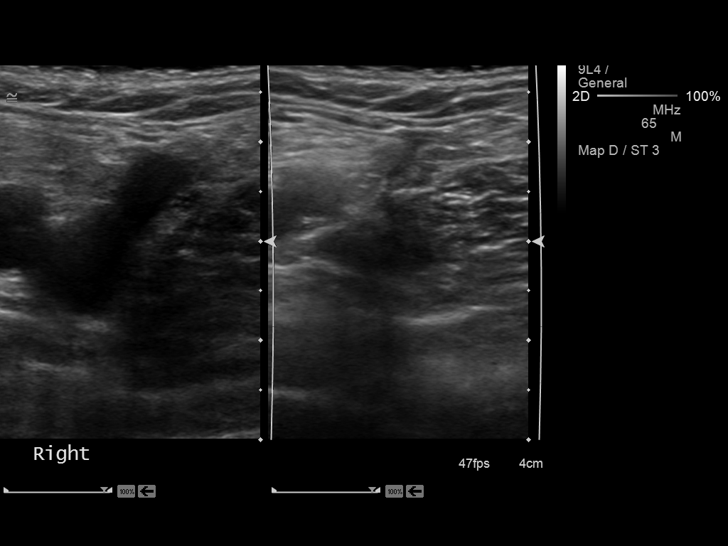
[im 4/45]
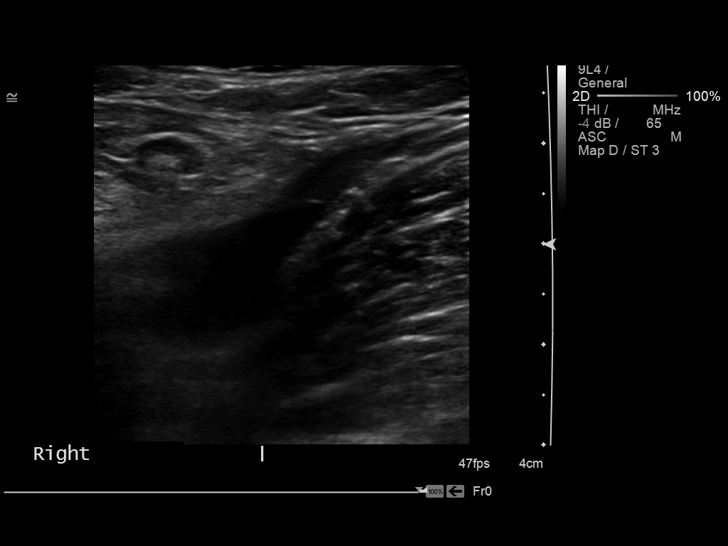
[im 8/45]
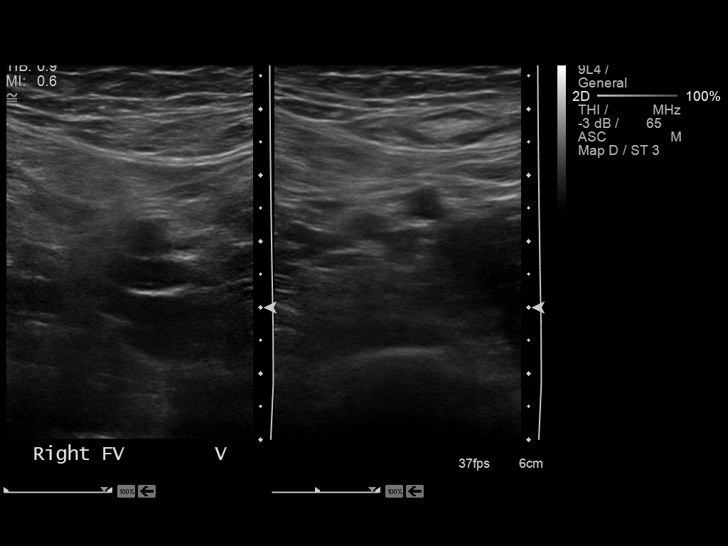
[im 12/45]
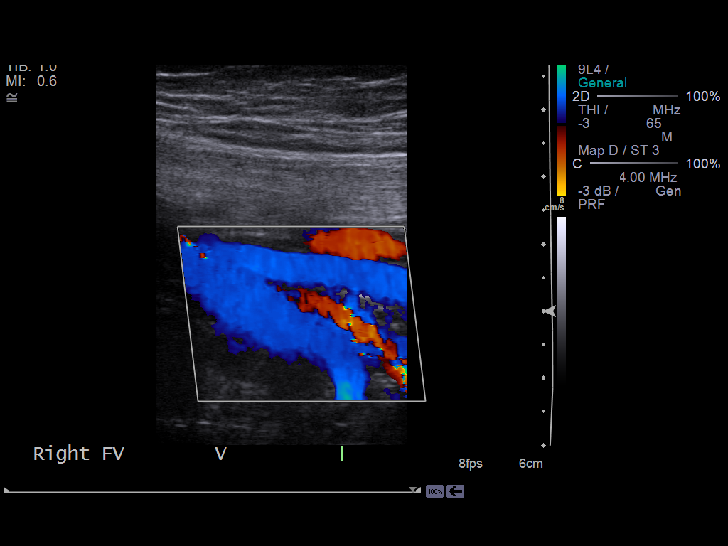
[im 14/45]
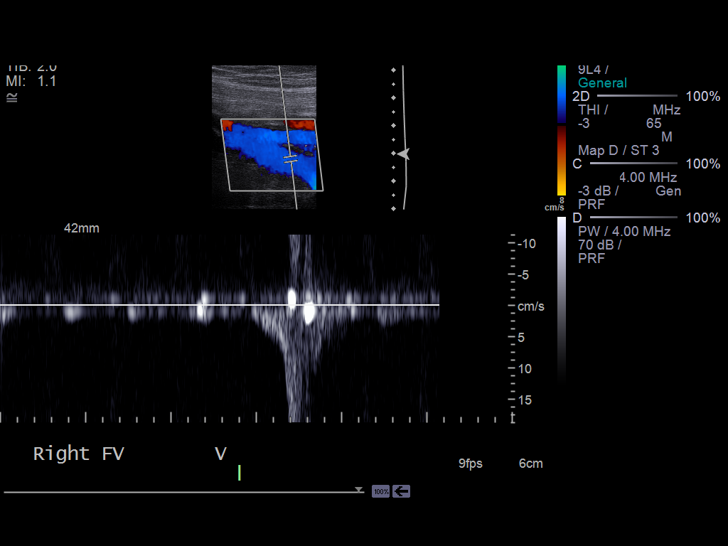
[im 18/45]
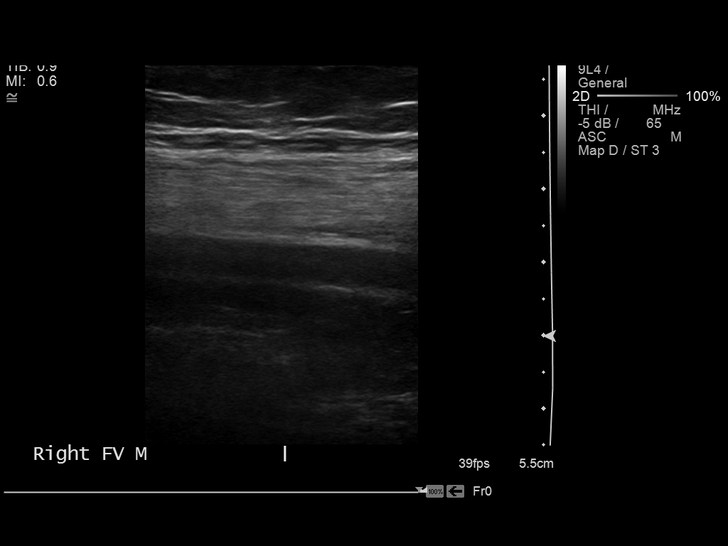
[im 22/45]
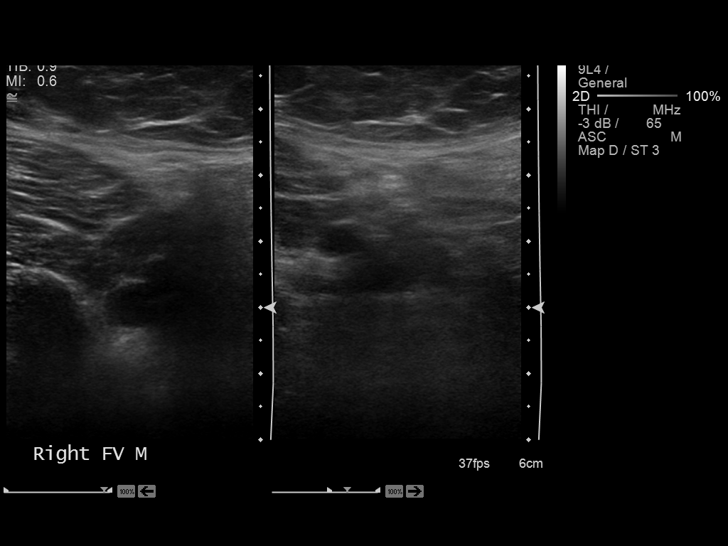
[im 23/45]
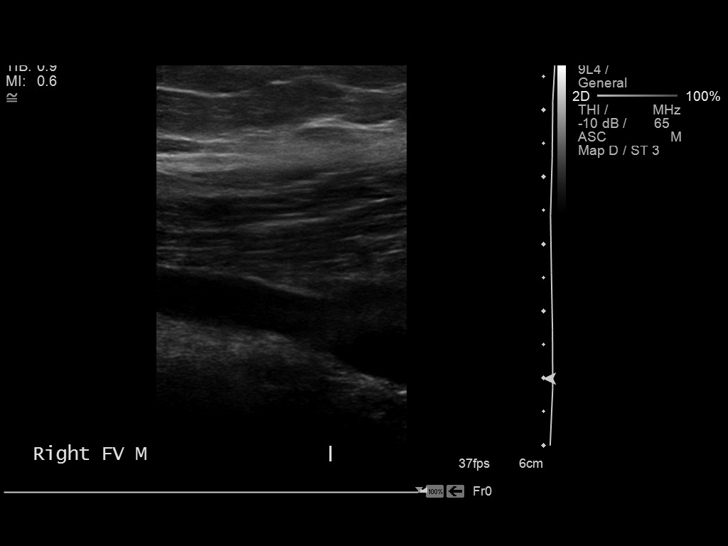
[im 27/45]
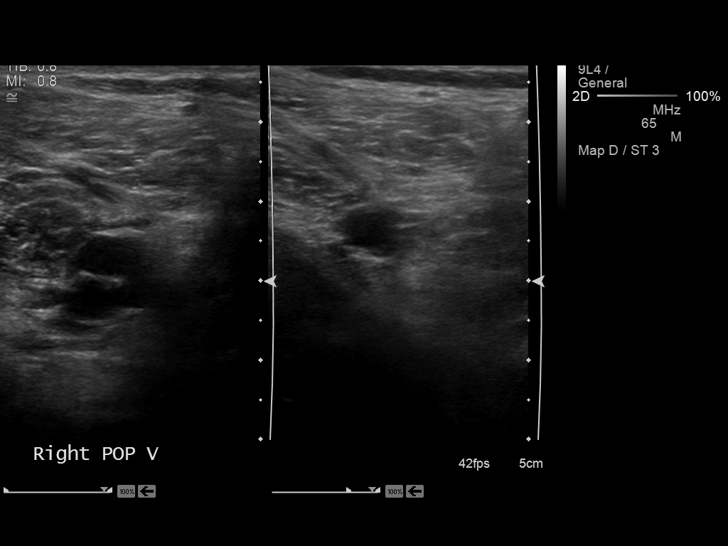
[im 31/45]
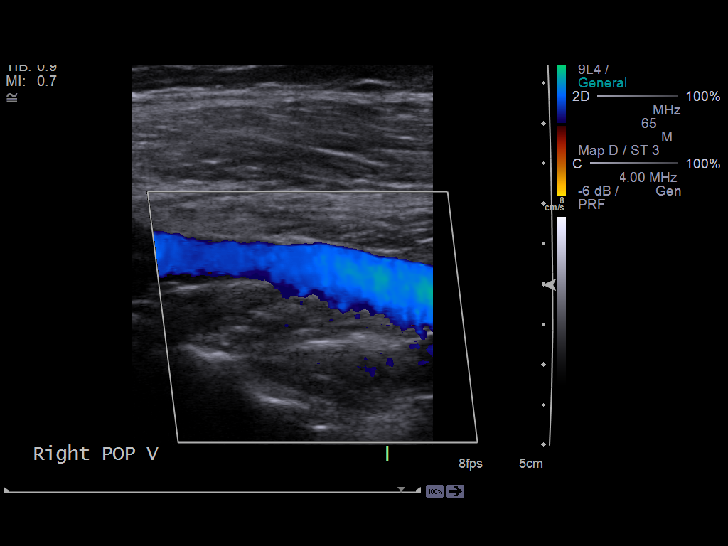
[im 35/45]
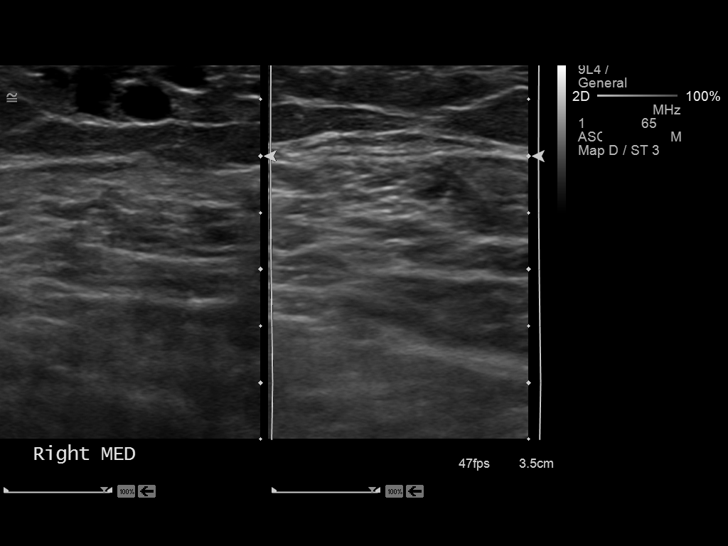
[im 37/45]
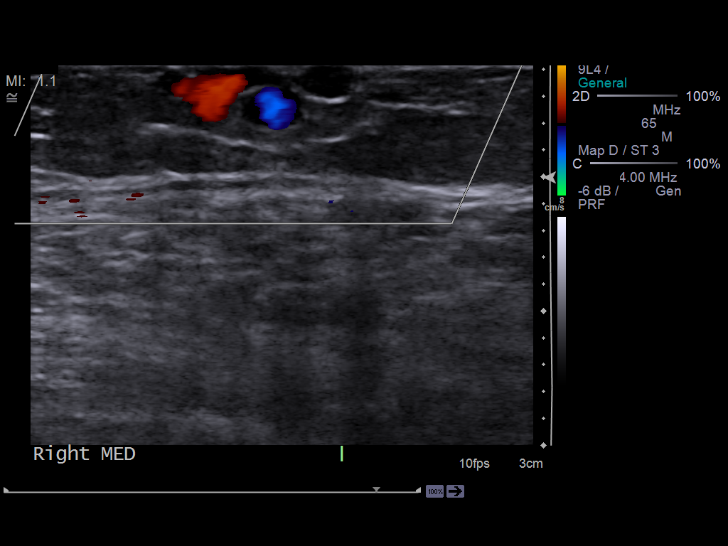
[im 41/45]
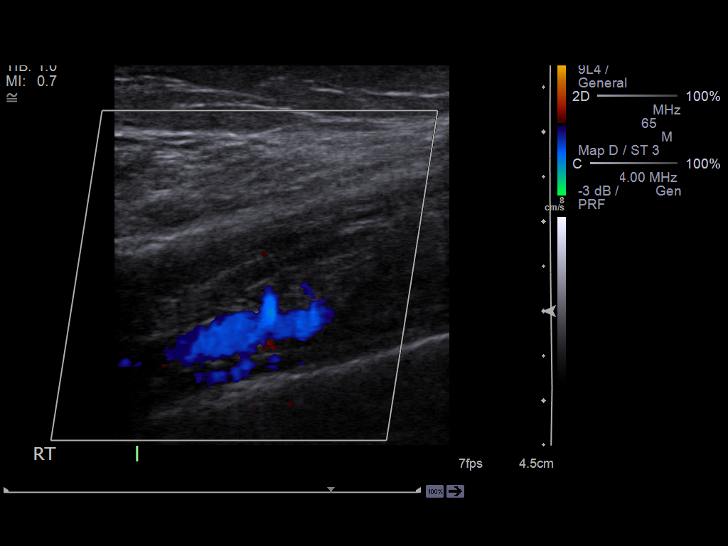
[im 45/45]
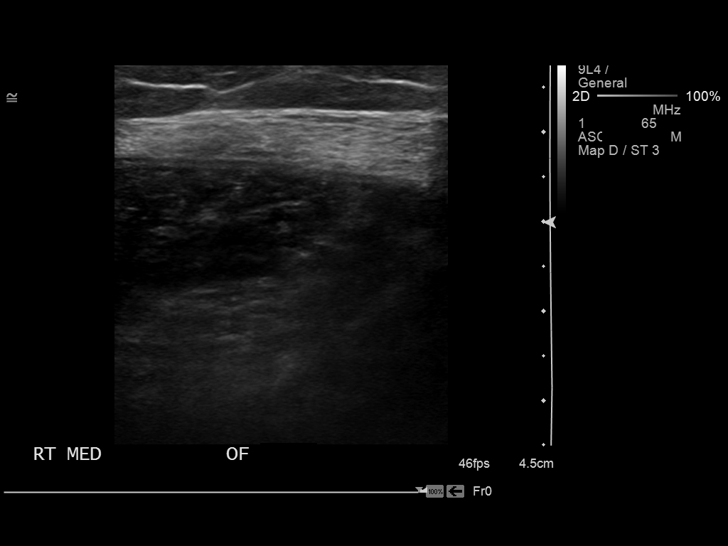

[14 of 24 positions shown; findings below may reference images not displayed]

FINDINGS: Flow in the venous structures of the right lower
extremity is spontaneous and phasic in all segments.  There is
normal compression and augmentation in the venous structures of the
right lower extremity.  Venous Doppler signal is normal in all
regions.  There is no thrombus in the deep or visualized
superficial venous structures on the right.  There is a right-sided
deep venous incompetence.

There are several small benign-appearing right inguinal lymph
nodes, probably of reactive etiology.
CONCLUSION: No evidence of right lower extremity deep venous
thrombosis.

## 2014-10-23 ENCOUNTER — Telehealth: Payer: Self-pay | Admitting: Family Medicine

## 2014-10-23 NOTE — Telephone Encounter (Signed)
Pls contact pt , verify how she is taking potassium solution, did not understand that 15 cc is 3 teaspoon. If taking as prescribed along with the maxzide and spironolactone for her BP, need to ge trept chem 7 2nd week in January so I can review potassium when taking as prescribed,thanks. pls order lab ?? pls ask

## 2014-10-23 NOTE — Assessment & Plan Note (Signed)
Recurrent hypokalemia secondary to non compliance Instructed re how serious this is, educated as to fact that 15 cc is 3 teaspoons, will recheck potassium level today as Ed follow u, will also  Need re check on potassium in 3 weeks when taking as prescribed regualrly as dose may be lower, srates she thought 15cc was one tsp

## 2014-10-23 NOTE — Assessment & Plan Note (Signed)
Controlled, no change in medication DASH diet and commitment to daily physical activity for a minimum of 30 minutes discussed and encouraged, as a part of hypertension management. The importance of attaining a healthy weight is also discussed.  

## 2014-10-23 NOTE — Progress Notes (Signed)
   Subjective:    Patient ID: Jane English, female    DOB: Oct 21, 1971, 43 y.o.   MRN: [id number]  HPI Pt in for Ed f/u for generalized numbness and tingling, when she was found to be hypokalemic [place] had missed potassium supplement and had not beenm taking as prescribed, did not understand 15cc volume Feels better since ED supplement but still reports numbness. Mental health improved, has first grand child, and improved relationship with her spouse   Review of Systems See HPI Denies recent fever or chills. Denies sinus pressure, nasal congestion, ear pain or sore throat. Denies chest congestion, productive cough or wheezing. Denies chest pains, palpitations and leg swelling  Denies uncontrolled depression, anxiety or insomnia. Denies skin break down or rash.        Objective:   Physical Exam BP 128/80 mmHg  Pulse 87  Resp 16  Ht 5\' 6"  (1.676 m)  Wt 157 lb (71.215 kg)  BMI 25.35 kg/m2  SpO2 97%  LMP 09/19/2014 Patient alert and oriented and in no cardiopulmonary distress.Pt biting her lip intensely at times, to demonstrate that she has no feeling  HEENT: No facial asymmetry, EOMI,   oropharynx pink and moist.  Neck supple no JVD, no mass.  Chest: Clear to auscultation bilaterally.  CVS: S1, S2 no murmurs, no S3.Regular rate.  ABD: Soft non tender.   Ext: No edema  MS: Adequate ROM spine, shoulders, hips and knees.  Skin: Intact, no ulcerations or rash noted.  Psych: Good eye contact, normal affect. Memory intact mildly  anxious not r depressed appearing.  CNS: CN 2-12 intact, power,  normal throughout.no focal deficits noted.        Assessment & Plan:  Hypokalemia Recurrent hypokalemia secondary to non compliance Instructed re how serious this is, educated as to fact that 15 cc is 3 teaspoons, will recheck potassium level today as Ed follow u, will also  Need re check on potassium in 3 weeks when taking as prescribed regualrly as dose may be lower, srates  she thought 15cc was one tsp  Essential hypertension Controlled, no change in medication DASH diet and commitment to daily physical activity for a minimum of 30 minutes discussed and encouraged, as a part of hypertension management. The importance of attaining a healthy weight is also discussed.

## 2014-10-25 ENCOUNTER — Other Ambulatory Visit: Payer: Self-pay

## 2014-10-25 DIAGNOSIS — I10 Essential (primary) hypertension: Secondary | ICD-10-CM

## 2014-10-25 MED ORDER — TRIAMTERENE-HCTZ 37.5-25 MG PO TABS: ORAL_TABLET | ORAL | Status: DC

## 2014-10-25 NOTE — Telephone Encounter (Signed)
Noted, thanks!

## 2014-10-25 NOTE — Telephone Encounter (Signed)
She has been taking K+ 3 tsp bid and the spirolactolone daily. She didn't have the maxzide so I faxed it in. Aware to have labs 2nd week in Jan. They have been mailed to patient

## 2014-11-08 ENCOUNTER — Other Ambulatory Visit: Payer: Self-pay | Admitting: Internal Medicine

## 2014-11-10 ENCOUNTER — Ambulatory Visit (HOSPITAL_COMMUNITY): Payer: Self-pay | Admitting: Psychiatry

## 2014-11-12 ENCOUNTER — Other Ambulatory Visit: Payer: Self-pay | Admitting: Family Medicine

## 2014-11-16 ENCOUNTER — Ambulatory Visit: Payer: Managed Care, Other (non HMO) | Admitting: Family Medicine

## 2014-11-25 ENCOUNTER — Ambulatory Visit (HOSPITAL_COMMUNITY): Payer: Self-pay | Admitting: Psychiatry

## 2014-11-28 ENCOUNTER — Ambulatory Visit (INDEPENDENT_AMBULATORY_CARE_PROVIDER_SITE_OTHER): Payer: Managed Care, Other (non HMO) | Admitting: Family Medicine

## 2014-11-28 ENCOUNTER — Encounter: Payer: Self-pay | Admitting: Family Medicine

## 2014-11-28 VITALS — BP 126/82 | HR 76 | Resp 18 | Ht 66.0 in | Wt 162.1 lb

## 2014-11-28 DIAGNOSIS — F439 Reaction to severe stress, unspecified: Secondary | ICD-10-CM

## 2014-11-28 DIAGNOSIS — I10 Essential (primary) hypertension: Secondary | ICD-10-CM

## 2014-11-28 DIAGNOSIS — Z638 Other specified problems related to primary support group: Secondary | ICD-10-CM

## 2014-11-28 DIAGNOSIS — N926 Irregular menstruation, unspecified: Secondary | ICD-10-CM | POA: Diagnosis not present

## 2014-11-28 DIAGNOSIS — G894 Chronic pain syndrome: Secondary | ICD-10-CM | POA: Diagnosis not present
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
## 2014-11-28 NOTE — Assessment & Plan Note (Signed)
Currently controlled and managed at [facility] center, states uses less pain medicine than is prescribed and the Doc is aware
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
## 2014-11-28 NOTE — Patient Instructions (Signed)
Annual wellness in 3.5 month, call if you need me before  Blood test todday, if confirmed not pregnant, tomorrow medication will be sent in to induce a flow  Continue to see therapist regularly   I hope your situation improves soon

## 2014-11-28 NOTE — Assessment & Plan Note (Signed)
Controlled, no change in medication DASH diet and commitment to daily physical activity for a minimum of 30 minutes discussed and encouraged, as a part of hypertension management. The importance of attaining a healthy weight is also discussed.  

## 2014-11-28 NOTE — Assessment & Plan Note (Signed)
LMP 10/21/2014, prior to this had regular cycles, also experiencing some hot flashes Will test to ensure not pregnant , then give provera challenge Ongoing severe stress in the home which is likley affecting menses also

## 2014-11-29 ENCOUNTER — Other Ambulatory Visit: Payer: Self-pay

## 2014-11-29 ENCOUNTER — Other Ambulatory Visit: Payer: Self-pay | Admitting: Family Medicine

## 2014-11-29 ENCOUNTER — Ambulatory Visit (INDEPENDENT_AMBULATORY_CARE_PROVIDER_SITE_OTHER): Payer: Managed Care, Other (non HMO) | Admitting: Psychiatry

## 2014-11-29 ENCOUNTER — Encounter (HOSPITAL_COMMUNITY): Payer: Self-pay | Admitting: Psychiatry

## 2014-11-29 VITALS — BP 126/71 | HR 81 | Ht 66.0 in | Wt 162.2 lb

## 2014-11-29 DIAGNOSIS — F3130 Bipolar disorder, current episode depressed, mild or moderate severity, unspecified: Secondary | ICD-10-CM

## 2014-11-29 DIAGNOSIS — F4310 Post-traumatic stress disorder, unspecified: Secondary | ICD-10-CM

## 2014-11-29 LAB — HCG, QUANTITATIVE, PREGNANCY: hCG, Beta Chain, Quant, S: <2 m[IU]/mL

## 2014-11-29 MED ORDER — ESCITALOPRAM OXALATE 20 MG PO TABS: 20.0000 mg | ORAL_TABLET | Freq: Every day | ORAL | Status: DC

## 2014-11-29 MED ORDER — ZOLPIDEM TARTRATE ER 12.5 MG PO TBCR: 12.5000 mg | EXTENDED_RELEASE_TABLET | Freq: Every evening | ORAL | Status: DC | PRN

## 2014-11-29 MED ORDER — ALPRAZOLAM 1 MG PO TABS: 1.0000 mg | ORAL_TABLET | Freq: Three times a day (TID) | ORAL | Status: DC | PRN

## 2014-11-29 MED ORDER — CARBAMAZEPINE 100 MG PO CHEW: CHEWABLE_TABLET | ORAL | Status: DC

## 2014-11-29 MED ORDER — MEDROXYPROGESTERONE ACETATE 5 MG PO TABS: 5.0000 mg | ORAL_TABLET | Freq: Two times a day (BID) | ORAL | Status: DC
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
## 2014-12-06 ENCOUNTER — Other Ambulatory Visit: Payer: Self-pay

## 2014-12-06 ENCOUNTER — Ambulatory Visit (INDEPENDENT_AMBULATORY_CARE_PROVIDER_SITE_OTHER): Payer: Managed Care, Other (non HMO)

## 2014-12-06 DIAGNOSIS — Z111 Encounter for screening for respiratory tuberculosis: Secondary | ICD-10-CM

## 2014-12-06 NOTE — Progress Notes (Signed)
PPD given in left arm with no complications. Will come back Thursday to have it read

## 2014-12-08 ENCOUNTER — Telehealth: Payer: Self-pay

## 2014-12-08 DIAGNOSIS — N912 Amenorrhea, unspecified: Secondary | ICD-10-CM

## 2014-12-08 LAB — TB SKIN TEST
Induration: 0 mm
TB Skin Test: NEGATIVE

## 2014-12-09 ENCOUNTER — Ambulatory Visit (INDEPENDENT_AMBULATORY_CARE_PROVIDER_SITE_OTHER): Payer: Managed Care, Other (non HMO) | Admitting: Psychiatry

## 2014-12-09 ENCOUNTER — Other Ambulatory Visit: Payer: Self-pay | Admitting: Family Medicine

## 2014-12-09 DIAGNOSIS — F3130 Bipolar disorder, current episode depressed, mild or moderate severity, unspecified: Secondary | ICD-10-CM

## 2014-12-09 DIAGNOSIS — N912 Amenorrhea, unspecified: Secondary | ICD-10-CM
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
## 2014-12-09 NOTE — Addendum Note (Signed)
Addended by: Denman George B on: 12/09/2014 10:49 AM   Modules accepted: Orders

## 2014-12-09 NOTE — Telephone Encounter (Signed)
Patient aware and referral entered   

## 2014-12-09 NOTE — Patient Instructions (Signed)
Discussed orally 

## 2014-12-09 NOTE — Telephone Encounter (Signed)
pls refer pt to gyne to help with period, also order pelvic US to evaluate, I will sign

## 2014-12-11 ENCOUNTER — Other Ambulatory Visit: Payer: Self-pay | Admitting: Family Medicine

## 2014-12-12 ENCOUNTER — Other Ambulatory Visit: Payer: Self-pay | Admitting: Family Medicine

## 2014-12-13 ENCOUNTER — Ambulatory Visit (HOSPITAL_COMMUNITY)
Admission: RE | Admit: 2014-12-13 | Discharge: 2014-12-13 | Disposition: A | Discharge status: Home / Self Care | Payer: Managed Care, Other (non HMO) | Source: Ambulatory Visit | Attending: Family Medicine | Admitting: Family Medicine

## 2014-12-13 DIAGNOSIS — N912 Amenorrhea, unspecified: Secondary | ICD-10-CM | POA: Diagnosis not present

## 2014-12-14 ENCOUNTER — Other Ambulatory Visit: Payer: Self-pay | Admitting: Family Medicine

## 2014-12-14 DIAGNOSIS — N912 Amenorrhea, unspecified: Secondary | ICD-10-CM

## 2014-12-14 DIAGNOSIS — N83209 Unspecified ovarian cyst, unspecified side: Secondary | ICD-10-CM

## 2014-12-20 ENCOUNTER — Encounter: Payer: Self-pay | Admitting: *Deleted

## 2014-12-21 ENCOUNTER — Encounter: Payer: Self-pay | Admitting: Neurology

## 2014-12-21 ENCOUNTER — Ambulatory Visit (INDEPENDENT_AMBULATORY_CARE_PROVIDER_SITE_OTHER): Payer: Managed Care, Other (non HMO) | Admitting: Neurology

## 2014-12-21 VITALS — BP 107/66 | HR 66 | Ht 66.0 in | Wt 166.0 lb

## 2014-12-21 DIAGNOSIS — G43719 Chronic migraine without aura, intractable, without status migrainosus: Secondary | ICD-10-CM

## 2014-12-21 MED ORDER — ONABOTULINUMTOXINA 100 UNITS IJ SOLR
200.0000 [IU] | Freq: Once | INTRAMUSCULAR | Status: AC
  Administered 2014-12-21: 200 [IU] via INTRAMUSCULAR

## 2014-12-21 NOTE — Progress Notes (Signed)
PATIENT: Jane English DOB: 02-14-1971  HISTORICAL  Jane English is a 44 years old right-handed African American female, accompanied by her husband, referred by her primary care physician Tula Nakayama for evaluation of headaches  She has past medical history of bipolar disorder, on polypharmacy treatment, including carbamazepine, Xanax, she also has past medical history of hypertension, amlodipine, multiple surgery in the past, also complains of worsening mood disorder recently. She had a history of multiple lumbar surgeries, with left foot drop, mild gait difficulty, now she wears a left ankle brace, chronic low back pain, had a spinal stimulator placement.  She has a history of headaches since childhood, it was intermittent, until about 2011, she began to have frequent headaches, increase in recent 2 months, it has exacerbated to a daily basis, severe pounding, pressure headaches, holocranial, for a while, she has been taking daily Maxalt, without help, she used to take ibuprofen, Tylenol daily without helping her headache either.  Recent CAT scan of the brain with and without contrast was normal  Laboratory showed normal CMP, CBC  Received first Botox injection as migraine prevention September 22 2015  UPDATE Feb 17th 2016: She received her first Botox injection in September 22 2015, reported 20% improvement, she has less frequent, less severe headaches, no significant side effect,  REVIEW OF SYSTEMS: Full 14 system review of systems performed and notable only for as above ALLERGIES: Allergies  Allergen Reactions  . Amoxicillin Hives and Shortness Of Breath  . Neurontin [Gabapentin] Other (See Comments) and Nausea And Vomiting    Other reaction(s): Mental Status Changes (intolerance) hallucinations Sleep walking  . Penicillins Hives and Shortness Of Breath  . Pregabalin Diarrhea, Shortness Of Breath and Swelling  . Tramadol     delusional Other reaction(s): Delusions  (intolerance) delusional  . Latex Rash    HOME MEDICATIONS: Current Outpatient Prescriptions on File Prior to Visit  Medication Sig Dispense Refill  . albuterol (PROVENTIL) (2.5 MG/3ML) 0.083% nebulizer solution Take 3 mLs (2.5 mg total) by nebulization every 6 (six) hours as needed. Use one vial per nebulizer every 6 hours as needed for wheezing 75 mL 5  . ALPRAZolam (XANAX) 1 MG tablet Take 1 tablet (1 mg total) by mouth 3 (three) times daily as needed for anxiety. 90 tablet 2  . AMITIZA 24 MCG capsule TAKE ONE CAPSULE BY MOUTH TWICE A DAY WITH FOOD 60 capsule 3  . amLODipine (NORVASC) 10 MG tablet Take 10 mg by mouth every morning.    . beclomethasone (QVAR) 40 MCG/ACT inhaler Inhale 2 puffs into the lungs 2 (two) times daily. 1 Inhaler 12  . Botulinum Toxin Type A 200 UNITS SOLR Inject 200 Units as directed.    . carbamazepine (TEGRETOL) 100 MG chewable tablet Take two in the am and three at night 150 tablet 2  . cetirizine (ZYRTEC) 10 MG tablet Take 10 mg by mouth daily.    . cloNIDine (CATAPRES) 0.1 MG tablet TAKE 1 TABLET AT BEDTIME FOR HYPERTENSION 30 tablet 3  . Elastic Bandages & Supports (ABDOMINAL BINDER/ELASTIC MED) MISC 1 application by Does not apply route daily. 1 each 0  . escitalopram (LEXAPRO) 20 MG tablet Take 1 tablet (20 mg total) by mouth daily. 30 tablet 2  . Fe Fum-FePoly-Vit C-Vit B3 (INTEGRA) 62.5-62.5-40-3 MG CAPS Take 1 capsule by mouth daily. 30 capsule 3  . fluticasone (FLONASE) 50 MCG/ACT nasal spray PLACE 2 SPRAYS INTO BOTH NOSTRILS DAILY. 16 g 2  . ipratropium (ATROVENT)  0.02 % nebulizer solution Take 2.5 mLs (500 mcg total) by nebulization every 6 (six) hours as needed. Use one vial per nebulizer every 6 hours as needed for wheezing 75 mL 5  . medroxyPROGESTERone (PROVERA) 5 MG tablet Take 1 tablet (5 mg total) by mouth 2 (two) times daily. 10 tablet 0  . montelukast (SINGULAIR) 10 MG tablet take 1 tablet by mouth at bedtime 30 tablet 4  . olopatadine  (PATANOL) 0.1 % ophthalmic solution Place 1 drop into both eyes 2 (two) times daily. 5 mL 3  . pantoprazole (PROTONIX) 40 MG tablet TAKE 1 TABLET (40 MG TOTAL) BY MOUTH DAILY. 30 tablet 0  . potassium chloride 20 MEQ/15ML (10%) SOLN Take 15 mLs (20 mEq total) by mouth 2 (two) times daily. 480 mL 3  . spironolactone (ALDACTONE) 25 MG tablet Take 1 tablet (25 mg total) by mouth daily. 30 tablet 5  . topiramate (TOPAMAX) 100 MG tablet TAKE 1 TABLET TWICE A DAY 60 tablet 3  . traMADol (ULTRAM) 50 MG tablet Take 1 tablet (50 mg total) by mouth every 6 (six) hours as needed. 20 tablet 3  . triamterene-hydrochlorothiazide (DYAZIDE) 37.5-25 MG per capsule TAKE ONE CAPSULE BY MOUTH EVERY DAY 30 capsule 3  . zolpidem (AMBIEN CR) 12.5 MG CR tablet Take 1 tablet (12.5 mg total) by mouth at bedtime as needed for sleep. 30 tablet 1   No current facility-administered medications on file prior to visit.    PAST MEDICAL HISTORY: Past Medical History  Diagnosis Date  . Migraine headache   . Back pain   . Obesity   . Hypertension   . DVT (deep venous thrombosis) 2010  . Heart murmur     no cardiologist  . Asthma   . Bronchitis   . Depression   . Diabetes mellitus without complication   . Obsessive-compulsive disorder   . PTSD (post-traumatic stress disorder)   . Shortness of breath   . GERD (gastroesophageal reflux disease)   . Anemia   . PSYCHOTIC D/O W/HALLUCINATIONS CONDS CLASS ELSW 03/04/2010    Qualifier: Diagnosis of  By: Moshe Cipro MD, Joycelyn Schmid    . Asthma flare 04/09/2013  . Seasonal allergies 12/10/2012  . Chronic abdominal pain   . Chronic constipation   . Diabetes mellitus, type II   . SBO (small bowel obstruction) 08/09/2013  . Helicobacter pylori gastritis 06/11/2013    Colonoscopy Dr. Hilarie Fredrickson    PAST SURGICAL HISTORY: Past Surgical History  Procedure Laterality Date  . Appendectomy  1986  . Carpal tunnel release Bilateral   . Tubal ligation  1994  . Lumbar spine surgery  2010    x 3    . Lysis of adhesion  2003    Dr. Irving Shows  . Trigger finger release  2009    right pinkie finger  . Anterior cervical decomp/discectomy fusion  07/07/2012    Procedure: ANTERIOR CERVICAL DECOMPRESSION/DISCECTOMY FUSION 2 LEVELS;  Surgeon: Floyce Stakes, MD;  Location: [facility];  Service: Neurosurgery;  Laterality: N/A;  Cervical four-five, five - six  Anterior cervical decompression/diskectomy/fusion/plate  . Partial hysterectomy  1990s?    [place]  . Spinal cord stimulator implant    . Oophorectomy    . Laparoscopy N/A 07/29/2013    Procedure: diagnostic laporoscopy;  Surgeon: Adin Hector, MD;  Location: [facility];  Service: General;  Laterality: N/A;  . Lysis of adhesion N/A 07/29/2013    Procedure: LYSIS OF ADHESION;  Surgeon: Adin Hector,  MD;  Location: [facility];  Service: General;  Laterality: N/A;  . Bowel resection N/A 07/29/2013    Procedure: serosal repair;  Surgeon: Adin Hector, MD;  Location: [facility];  Service: General;  Laterality: N/A;  . Laparoscopy N/A 08/16/2013    Procedure: LAPAROSCOPY DIAGNOSTIC/LYSIS OF ADHESIONS;  Surgeon: Adin Hector, MD;  Location: [facility];  Service: General;  Laterality: N/A;  . Laparotomy N/A 08/16/2013    Procedure: EXPLORATORY LAPAROTOMY/SMALL BOWEL RESECTION (JEJUNUM);  Surgeon: Adin Hector, MD;  Location: [facility];  Service: General;  Laterality: N/A;    FAMILY HISTORY: Family History  Problem Relation Age of Onset  . Lung cancer Father   . Stomach cancer Father   . Esophageal cancer Father   . Alcohol abuse Father   . Mental illness Father   . Diabetes Sister   . Hypertension Sister   . Bipolar disorder Sister   . Schizophrenia Sister   . Diabetes Sister   . Alcohol abuse Brother   . Hypertension Brother   . Kidney disease Brother   . Diabetes Brother   . Drug abuse Brother   . Mental illness Brother   . Alcohol abuse Brother   . Alcohol abuse Brother   . Hypertension Brother   . Diabetes Brother   .  Alcohol abuse Brother   . Physical abuse Mother   . Alcohol abuse Mother   . Mental illness Brother     Deceased  . ADD / ADHD Neg Hx   . Anxiety disorder Neg Hx   . Dementia Neg Hx   . Depression Neg Hx   . OCD Neg Hx   . Seizures Neg Hx   . Paranoid behavior Neg Hx   . Drug abuse Sister   . Alcohol abuse Brother   . Colon cancer Neg Hx    SOCIAL HISTORY:  History   Social History  . Marital Status: Married    Spouse Name: N/A    4   . Years of Education: N/A   Occupational History  . disability   Social History Main Topics  . Smoking status: Never Smoker   . Smokeless tobacco: Never Used  . Alcohol Use: No  . Drug Use: No  . Sexual Activity: Yes   Other Topics Concern  . Not on file   Social History Narrative  . No narrative on file   PHYSICAL EXAM   Filed Vitals:   12/21/14 1511  BP: 107/66  Pulse: 66  Height: 5\' 6"  (1.676 m)  Weight: 166 lb (75.297 kg)    Not recorded      Body mass index is 26.81 kg/(m^2).   Generalized: In no acute distress  Neck: Supple, no carotid bruits   Cardiac: Regular rate rhythm  Pulmonary: Clear to auscultation bilaterally  Musculoskeletal: No deformity  Neurological examination  Mentation: Alert oriented to time, place, history taking, and causual conversation, depressed looking middle-age female  Cranial nerve II-XII: Pupils were equal round reactive to light. Extraocular movements were full.  Visual field were full on confrontational test. Bilateral fundi were sharp.  Facial sensation and strength were normal. Hearing was intact to finger rubbing bilaterally. Uvula tongue midline.  Head turning and shoulder shrug and were normal and symmetric.Tongue protrusion into cheek strength was normal.  Motor: Left ankle dorsi flexion weakness, whirring left ankle brace,.  Sensory: Intact to fine touch, pinprick, preserved vibratory sensation, and proprioception at toes.  Coordination: Normal finger to nose,  heel-to-shin bilaterally  there was no truncal ataxia  Gait: Rising up from seated position without assistance, normal stance, without trunk ataxia, moderate stride, good arm swing  Romberg signs: Negative  Deep tendon reflexes: Brachioradialis 2/2, biceps 2/2, triceps 2/2, patellar 2/2, Achilles absent, plantar responses were flexor bilaterally.   DIAGNOSTIC DATA (LABS, IMAGING, TESTING) - I reviewed patient records, labs, notes, testing and imaging myself where available.  Lab Results  Component Value Date   WBC 7.3 10/17/2014   HGB 10.8* 10/17/2014   HCT 33.2* 10/17/2014   MCV 91.5 10/17/2014   PLT 228 10/17/2014      Component Value Date/Time   NA 138 10/20/2014 1632   K 3.9 10/20/2014 1632   CL 100 10/20/2014 1632   CO2 29 10/20/2014 1632   GLUCOSE 84 10/20/2014 1632   BUN 13 10/20/2014 1632   CREATININE 0.94 10/20/2014 1632   CREATININE 0.77 10/17/2014 0128   CALCIUM 8.8 10/20/2014 1632   CALCIUM 8.8 10/20/2014 1632   PROT 7.5 08/10/2014 0804   ALBUMIN 4.4 08/10/2014 0804   AST 16 08/10/2014 0804   ALT 14 08/10/2014 0804   ALKPHOS 54 08/10/2014 0804   BILITOT 0.7 08/10/2014 0804   GFRNONAA >90 10/17/2014 0128   GFRNONAA 64 08/10/2014 0804   GFRAA >90 10/17/2014 0128   GFRAA 73 08/10/2014 0804   Lab Results  Component Value Date   CHOL 189 08/10/2014   HDL 75 08/10/2014   LDLCALC 101* 08/10/2014   TRIG 67 08/10/2014   CHOLHDL 2.5 08/10/2014   Lab Results  Component Value Date   HGBA1C 6.0* 10/20/2014   No results found for: [id number] Lab Results  Component Value Date   TSH 0.772 02/03/2014      ASSESSMENT AND PLAN  Jane English is a 44 y.o. female with past medical history of mood disorder, hypertension, she is on polypharmacy treatment, now presenting with chronic headaches, with migraine features, She is on polypharmacy already, including Topamax, amlodipine, Tegretol, recent diagnosis of schizophrenia, continue has frequent migraine  headaches  BOTOX injection was performed according to protocol by [employer]. 100 units of BOTOX was dissolved into 2 cc NS. ( Used 200 units Corrugator 2 sites, 10 units Procerus 1 site, 5 unit Frontalis 4 sites,  20 units, Temporalis 8 sites,  40 units  Occipitalis 6 sites, 30 units Cervical Paraspinal, 4 sites, 20 units Trapezius, 6 sites, 30 units  Extra 45 units was injected along her bilateral cervical paraspinals,  She tolerated the injection well, will return to clinic in 3 months for repeat injection.   Marcial Pacas, M.D Ph.D.  [facility] [address] [place], [place] [address] [phone]

## 2014-12-22 ENCOUNTER — Ambulatory Visit (HOSPITAL_COMMUNITY): Payer: Self-pay | Admitting: Psychiatry

## 2014-12-29 ENCOUNTER — Other Ambulatory Visit: Payer: Self-pay | Admitting: Family Medicine

## 2014-12-31 ENCOUNTER — Other Ambulatory Visit: Payer: Self-pay | Admitting: Family Medicine

## 2015-01-02 ENCOUNTER — Emergency Department (HOSPITAL_COMMUNITY)
Admission: EM | Admit: 2015-01-02 | Discharge: 2015-01-02 | Disposition: A | Discharge status: Home / Self Care | Payer: Managed Care, Other (non HMO) | Attending: Emergency Medicine | Admitting: Emergency Medicine

## 2015-01-02 ENCOUNTER — Emergency Department (HOSPITAL_COMMUNITY): Payer: Managed Care, Other (non HMO)

## 2015-01-02 ENCOUNTER — Encounter (HOSPITAL_COMMUNITY): Payer: Self-pay | Admitting: Emergency Medicine

## 2015-01-02 DIAGNOSIS — E119 Type 2 diabetes mellitus without complications: Secondary | ICD-10-CM | POA: Insufficient documentation

## 2015-01-02 DIAGNOSIS — Z91040 Latex allergy status: Secondary | ICD-10-CM | POA: Insufficient documentation

## 2015-01-02 DIAGNOSIS — S3992XA Unspecified injury of lower back, initial encounter: Secondary | ICD-10-CM | POA: Insufficient documentation

## 2015-01-02 DIAGNOSIS — M545 Low back pain, unspecified: Secondary | ICD-10-CM

## 2015-01-02 DIAGNOSIS — M542 Cervicalgia: Secondary | ICD-10-CM | POA: Diagnosis not present

## 2015-01-02 DIAGNOSIS — K219 Gastro-esophageal reflux disease without esophagitis: Secondary | ICD-10-CM | POA: Insufficient documentation

## 2015-01-02 DIAGNOSIS — G43909 Migraine, unspecified, not intractable, without status migrainosus: Secondary | ICD-10-CM | POA: Diagnosis not present

## 2015-01-02 DIAGNOSIS — M549 Dorsalgia, unspecified: Secondary | ICD-10-CM

## 2015-01-02 DIAGNOSIS — Y9389 Activity, other specified: Secondary | ICD-10-CM | POA: Insufficient documentation

## 2015-01-02 DIAGNOSIS — Z7951 Long term (current) use of inhaled steroids: Secondary | ICD-10-CM | POA: Insufficient documentation

## 2015-01-02 DIAGNOSIS — S199XXA Unspecified injury of neck, initial encounter: Secondary | ICD-10-CM | POA: Insufficient documentation

## 2015-01-02 DIAGNOSIS — Y998 Other external cause status: Secondary | ICD-10-CM | POA: Diagnosis not present

## 2015-01-02 DIAGNOSIS — Z880 Allergy status to penicillin: Secondary | ICD-10-CM | POA: Diagnosis not present

## 2015-01-02 DIAGNOSIS — I10 Essential (primary) hypertension: Secondary | ICD-10-CM | POA: Insufficient documentation

## 2015-01-02 DIAGNOSIS — Y92410 Unspecified street and highway as the place of occurrence of the external cause: Secondary | ICD-10-CM | POA: Diagnosis not present

## 2015-01-02 DIAGNOSIS — F42 Obsessive-compulsive disorder: Secondary | ICD-10-CM | POA: Insufficient documentation

## 2015-01-02 DIAGNOSIS — J45909 Unspecified asthma, uncomplicated: Secondary | ICD-10-CM | POA: Diagnosis not present

## 2015-01-02 DIAGNOSIS — Z79899 Other long term (current) drug therapy: Secondary | ICD-10-CM | POA: Diagnosis not present

## 2015-01-02 DIAGNOSIS — Z8619 Personal history of other infectious and parasitic diseases: Secondary | ICD-10-CM | POA: Insufficient documentation

## 2015-01-02 DIAGNOSIS — R011 Cardiac murmur, unspecified: Secondary | ICD-10-CM | POA: Insufficient documentation

## 2015-01-02 DIAGNOSIS — Z862 Personal history of diseases of the blood and blood-forming organs and certain disorders involving the immune mechanism: Secondary | ICD-10-CM | POA: Insufficient documentation

## 2015-01-02 DIAGNOSIS — E669 Obesity, unspecified: Secondary | ICD-10-CM | POA: Insufficient documentation

## 2015-01-02 DIAGNOSIS — G8929 Other chronic pain: Secondary | ICD-10-CM | POA: Diagnosis not present

## 2015-01-02 DIAGNOSIS — Z86718 Personal history of other venous thrombosis and embolism: Secondary | ICD-10-CM | POA: Insufficient documentation

## 2015-01-02 DIAGNOSIS — F329 Major depressive disorder, single episode, unspecified: Secondary | ICD-10-CM | POA: Insufficient documentation

## 2015-01-02 MED ORDER — ONDANSETRON HCL 4 MG/2ML IJ SOLN
4.0000 mg | Freq: Once | INTRAMUSCULAR | Status: AC
  Administered 2015-01-02: 4 mg via INTRAVENOUS
  Filled 2015-01-02: qty 2

## 2015-01-02 MED ORDER — HYDROCODONE-ACETAMINOPHEN 5-325 MG PO TABS
1.0000 | ORAL_TABLET | ORAL | Status: DC | PRN
Start: 2015-01-02 — End: 2015-01-11

## 2015-01-02 MED ORDER — HYDROMORPHONE HCL 1 MG/ML IJ SOLN
1.0000 mg | Freq: Once | INTRAMUSCULAR | Status: AC
  Administered 2015-01-02: 1 mg via INTRAVENOUS
  Filled 2015-01-02: qty 1

## 2015-01-02 MED ORDER — OXYCODONE-ACETAMINOPHEN 5-325 MG PO TABS
1.0000 | ORAL_TABLET | Freq: Once | ORAL | Status: AC
  Administered 2015-01-02: 1 via ORAL
  Filled 2015-01-02: qty 1

## 2015-01-02 MED ORDER — IBUPROFEN 600 MG PO TABS: 600.0000 mg | ORAL_TABLET | Freq: Three times a day (TID) | ORAL | Status: DC | PRN
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
## 2015-01-02 NOTE — Discharge Instructions (Signed)
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
## 2015-01-04 ENCOUNTER — Encounter: Payer: Managed Care, Other (non HMO) | Admitting: Obstetrics and Gynecology

## 2015-01-06 ENCOUNTER — Ambulatory Visit (HOSPITAL_COMMUNITY): Payer: Self-pay | Admitting: Psychiatry

## 2015-01-06 ENCOUNTER — Encounter (HOSPITAL_COMMUNITY): Payer: Self-pay | Admitting: Emergency Medicine

## 2015-01-06 ENCOUNTER — Telehealth: Payer: Self-pay | Admitting: Neurology

## 2015-01-06 DIAGNOSIS — K219 Gastro-esophageal reflux disease without esophagitis: Secondary | ICD-10-CM | POA: Insufficient documentation

## 2015-01-06 DIAGNOSIS — Z862 Personal history of diseases of the blood and blood-forming organs and certain disorders involving the immune mechanism: Secondary | ICD-10-CM | POA: Diagnosis not present

## 2015-01-06 DIAGNOSIS — Z79899 Other long term (current) drug therapy: Secondary | ICD-10-CM | POA: Insufficient documentation

## 2015-01-06 DIAGNOSIS — F329 Major depressive disorder, single episode, unspecified: Secondary | ICD-10-CM | POA: Insufficient documentation

## 2015-01-06 DIAGNOSIS — Z7951 Long term (current) use of inhaled steroids: Secondary | ICD-10-CM | POA: Diagnosis not present

## 2015-01-06 DIAGNOSIS — Z86718 Personal history of other venous thrombosis and embolism: Secondary | ICD-10-CM | POA: Diagnosis not present

## 2015-01-06 DIAGNOSIS — Z91040 Latex allergy status: Secondary | ICD-10-CM | POA: Insufficient documentation

## 2015-01-06 DIAGNOSIS — G43909 Migraine, unspecified, not intractable, without status migrainosus: Secondary | ICD-10-CM | POA: Diagnosis not present

## 2015-01-06 DIAGNOSIS — E119 Type 2 diabetes mellitus without complications: Secondary | ICD-10-CM | POA: Diagnosis not present

## 2015-01-06 DIAGNOSIS — R011 Cardiac murmur, unspecified: Secondary | ICD-10-CM | POA: Insufficient documentation

## 2015-01-06 DIAGNOSIS — J45901 Unspecified asthma with (acute) exacerbation: Secondary | ICD-10-CM | POA: Diagnosis not present

## 2015-01-06 DIAGNOSIS — E669 Obesity, unspecified: Secondary | ICD-10-CM | POA: Insufficient documentation

## 2015-01-06 DIAGNOSIS — R110 Nausea: Secondary | ICD-10-CM | POA: Diagnosis not present

## 2015-01-06 DIAGNOSIS — G8929 Other chronic pain: Secondary | ICD-10-CM | POA: Insufficient documentation

## 2015-01-06 DIAGNOSIS — I10 Essential (primary) hypertension: Secondary | ICD-10-CM | POA: Insufficient documentation

## 2015-01-06 DIAGNOSIS — Z8619 Personal history of other infectious and parasitic diseases: Secondary | ICD-10-CM | POA: Diagnosis not present

## 2015-01-06 DIAGNOSIS — S060X9D Concussion with loss of consciousness of unspecified duration, subsequent encounter: Secondary | ICD-10-CM | POA: Diagnosis not present

## 2015-01-06 DIAGNOSIS — R51 Headache: Secondary | ICD-10-CM | POA: Diagnosis present

## 2015-01-06 DIAGNOSIS — Z793 Long term (current) use of hormonal contraceptives: Secondary | ICD-10-CM | POA: Diagnosis not present

## 2015-01-06 DIAGNOSIS — S161XXD Strain of muscle, fascia and tendon at neck level, subsequent encounter: Secondary | ICD-10-CM | POA: Diagnosis not present

## 2015-01-06 DIAGNOSIS — M549 Dorsalgia, unspecified: Secondary | ICD-10-CM | POA: Insufficient documentation

## 2015-01-06 DIAGNOSIS — F419 Anxiety disorder, unspecified: Secondary | ICD-10-CM | POA: Diagnosis not present

## 2015-01-06 DIAGNOSIS — Z880 Allergy status to penicillin: Secondary | ICD-10-CM | POA: Diagnosis not present

## 2015-01-06 NOTE — Telephone Encounter (Signed)
Pt is calling stating she thinks she is having a reaction to the Botox.  She states she has a rash and all the places she got the shots she can feel the marks on her neck.  It itches and burns really bad and irritating.  She is having a tingling and numbness feeling as well.  Please call and advise.

## 2015-01-06 NOTE — Telephone Encounter (Signed)
Spoke to patient - an appt has been scheduled for her - she is going to try Benadryl and a cool compress.  She will proceed to urgent care if rash worsens.
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
## 2015-01-07 ENCOUNTER — Emergency Department (HOSPITAL_COMMUNITY): Payer: Managed Care, Other (non HMO)

## 2015-01-07 ENCOUNTER — Emergency Department (HOSPITAL_COMMUNITY)
Admission: EM | Admit: 2015-01-07 | Discharge: 2015-01-07 | Disposition: A | Discharge status: Home / Self Care | Payer: Managed Care, Other (non HMO) | Attending: Emergency Medicine | Admitting: Emergency Medicine

## 2015-01-07 DIAGNOSIS — S060X0A Concussion without loss of consciousness, initial encounter: Secondary | ICD-10-CM

## 2015-01-07 DIAGNOSIS — S161XXD Strain of muscle, fascia and tendon at neck level, subsequent encounter: Secondary | ICD-10-CM

## 2015-01-07 MED ORDER — IBUPROFEN 200 MG PO TABS
600.0000 mg | ORAL_TABLET | Freq: Once | ORAL | Status: AC
  Administered 2015-01-07: 600 mg via ORAL
  Filled 2015-01-07: qty 3

## 2015-01-07 MED ORDER — DIAZEPAM 5 MG PO TABS
5.0000 mg | ORAL_TABLET | Freq: Once | ORAL | Status: AC
  Administered 2015-01-07: 5 mg via ORAL
  Filled 2015-01-07: qty 1

## 2015-01-07 MED ORDER — DIAZEPAM 5 MG PO TABS: 5.0000 mg | ORAL_TABLET | Freq: Three times a day (TID) | ORAL | Status: DC | PRN

## 2015-01-07 MED ORDER — OXYCODONE-ACETAMINOPHEN 5-325 MG PO TABS
1.0000 | ORAL_TABLET | Freq: Once | ORAL | Status: AC
  Administered 2015-01-07: 1 via ORAL
  Filled 2015-01-07: qty 1

## 2015-01-07 MED ORDER — KETOROLAC TROMETHAMINE 60 MG/2ML IM SOLN
60.0000 mg | Freq: Once | INTRAMUSCULAR | Status: AC
  Administered 2015-01-07: 60 mg via INTRAMUSCULAR
  Filled 2015-01-07: qty 2

## 2015-01-07 NOTE — Discharge Instructions (Signed)
Cervical Sprain A cervical sprain is when the tissues (ligaments) that hold the neck bones in place stretch or tear. HOME CARE   Put ice on the injured area.  Put ice in a plastic bag.  Place a towel between your skin and the bag.  Leave the ice on for 15-20 minutes, 3-4 times a day.  You may have been given a collar to wear. This collar keeps your neck from moving while you heal.  Do not take the collar off unless told by your doctor.  If you have long hair, keep it outside of the collar.  Ask your doctor before changing the position of your collar. You may need to change its position over time to make it more comfortable.  If you are allowed to take off the collar for cleaning or bathing, follow your doctor's instructions on how to do it safely.  Keep your collar clean by wiping it with mild soap and water. Dry it completely. If the collar has removable pads, remove them every 1-2 days to hand wash them with soap and water. Allow them to air dry. They should be dry before you wear them in the collar.  Do not drive while wearing the collar.  Only take medicine as told by your doctor.  Keep all doctor visits as told.  Keep all physical therapy visits as told.  Adjust your work station so that you have good posture while you work.  Avoid positions and activities that make your problems worse.  Warm up and stretch before being active. GET HELP IF:  Your pain is not controlled with medicine.  You cannot take less pain medicine over time as planned.  Your activity level does not improve as expected. GET HELP RIGHT AWAY IF:   You are bleeding.  Your stomach is upset.  You have an allergic reaction to your medicine.  You develop new problems that you cannot explain.  You lose feeling (become numb) or you cannot move any part of your body (paralysis).  You have tingling or weakness in any part of your body.  Your symptoms get worse. Symptoms include:  Pain,  soreness, stiffness, puffiness (swelling), or a burning feeling in your neck.  Pain when your neck is touched.  Shoulder or upper back pain.  Limited ability to move your neck.  Headache.  Dizziness.  Your hands or arms feel week, lose feeling, or tingle.  Muscle spasms.  Difficulty swallowing or chewing. MAKE SURE YOU:   Understand these instructions.  Will watch your condition.  Will get help right away if you are not doing well or get worse. Document Released: 04/08/2008 Document Revised: 06/23/2013 Document Reviewed: 04/28/2013 [facility] Patient Information 2015 [place], [place]. This information is not intended to replace advice given to you by your health care provider. Make sure you discuss any questions you have with your health care provider. Concussion A concussion, or closed-head injury, is a brain injury caused by a direct blow to the head or by a quick and sudden movement (jolt) of the head or neck. Concussions are usually not life-threatening. Even so, the effects of a concussion can be serious. If you have had a concussion before, you are more likely to experience concussion-like symptoms after a direct blow to the head.  CAUSES  Direct blow to the head, such as from running into another player during a soccer game, being hit in a fight, or hitting your head on a hard surface.  A jolt of the  head or neck that causes the brain to move back and forth inside the skull, such as in a car crash. SIGNS AND SYMPTOMS The signs of a concussion can be hard to notice. Early on, they may be missed by you, family members, and health care providers. You may look fine but act or feel differently. Symptoms are usually temporary, but they may last for days, weeks, or even longer. Some symptoms may appear right away while others may not show up for hours or days. Every head injury is different. Symptoms include:  Mild to moderate headaches that will not go away.  A feeling of  pressure inside your head.  Having more trouble than usual:  Learning or remembering things you have heard.  Answering questions.  Paying attention or concentrating.  Organizing daily tasks.  Making decisions and solving problems.  Slowness in thinking, acting or reacting, speaking, or reading.  Getting lost or being easily confused.  Feeling tired all the time or lacking energy (fatigued).  Feeling drowsy.  Sleep disturbances.  Sleeping more than usual.  Sleeping less than usual.  Trouble falling asleep.  Trouble sleeping (insomnia).  Loss of balance or feeling lightheaded or dizzy.  Nausea or vomiting.  Numbness or tingling.  Increased sensitivity to:  Sounds.  Lights.  Distractions.  Vision problems or eyes that tire easily.  Diminished sense of taste or smell.  Ringing in the ears.  Mood changes such as feeling sad or anxious.  Becoming easily irritated or angry for little or no reason.  Lack of motivation.  Seeing or hearing things other people do not see or hear (hallucinations). DIAGNOSIS Your health care provider can usually diagnose a concussion based on a description of your injury and symptoms. He or she will ask whether you passed out (lost consciousness) and whether you are having trouble remembering events that happened right before and during your injury. Your evaluation might include:  A brain scan to look for signs of injury to the brain. Even if the test shows no injury, you may still have a concussion.  Blood tests to be sure other problems are not present. TREATMENT  Concussions are usually treated in an emergency department, in urgent care, or at a clinic. You may need to stay in the hospital overnight for further treatment.  Tell your health care provider if you are taking any medicines, including prescription medicines, over-the-counter medicines, and natural remedies. Some medicines, such as blood thinners (anticoagulants)  and aspirin, may increase the chance of complications. Also tell your health care provider whether you have had alcohol or are taking illegal drugs. This information may affect treatment.  Your health care provider will send you home with important instructions to follow.  How fast you will recover from a concussion depends on many factors. These factors include how severe your concussion is, what part of your brain was injured, your age, and how healthy you were before the concussion.  Most people with mild injuries recover fully. Recovery can take time. In general, recovery is slower in older persons. Also, persons who have had a concussion in the past or have other medical problems may find that it takes longer to recover from their current injury. HOME CARE INSTRUCTIONS General Instructions  Carefully follow the directions your health care provider gave you.  Only take over-the-counter or prescription medicines for pain, discomfort, or fever as directed by your health care provider.  Take only those medicines that your health care provider has approved.  Do not drink alcohol until your health care provider says you are well enough to do so. Alcohol and certain other drugs may slow your recovery and can put you at risk of further injury.  If it is harder than usual to remember things, write them down.  If you are easily distracted, try to do one thing at a time. For example, do not try to watch TV while fixing dinner.  Talk with family members or close friends when making important decisions.  Keep all follow-up appointments. Repeated evaluation of your symptoms is recommended for your recovery.  Watch your symptoms and tell others to do the same. Complications sometimes occur after a concussion. Older adults with a brain injury may have a higher risk of serious complications, such as a blood clot on the brain.  Tell your teachers, school nurse, school counselor, coach, athletic  trainer, or work Freight forwarder about your injury, symptoms, and restrictions. Tell them about what you can or cannot do. They should watch for:  Increased problems with attention or concentration.  Increased difficulty remembering or learning new information.  Increased time needed to complete tasks or assignments.  Increased irritability or decreased ability to cope with stress.  Increased symptoms.  Rest. Rest helps the brain to heal. Make sure you:  Get plenty of sleep at night. Avoid staying up late at night.  Keep the same bedtime hours on weekends and weekdays.  Rest during the day. Take daytime naps or rest breaks when you feel tired.  Limit activities that require a lot of thought or concentration. These include:  Doing homework or job-related work.  Watching TV.  Working on the computer.  Avoid any situation where there is potential for another head injury (football, hockey, soccer, basketball, martial arts, downhill snow sports and horseback riding). Your condition will get worse every time you experience a concussion. You should avoid these activities until you are evaluated by the appropriate follow-up health care providers. Returning To Your Regular Activities You will need to return to your normal activities slowly, not all at once. You must give your body and brain enough time for recovery.  Do not return to sports or other athletic activities until your health care provider tells you it is safe to do so.  Ask your health care provider when you can drive, ride a bicycle, or operate heavy machinery. Your ability to react may be slower after a brain injury. Never do these activities if you are dizzy.  Ask your health care provider about when you can return to work or school. Preventing Another Concussion It is very important to avoid another brain injury, especially before you have recovered. In rare cases, another injury can lead to permanent brain damage, brain  swelling, or death. The risk of this is greatest during the first 7-10 days after a head injury. Avoid injuries by:  Wearing a seat belt when riding in a car.  Drinking alcohol only in moderation.  Wearing a helmet when biking, skiing, skateboarding, skating, or doing similar activities.  Avoiding activities that could lead to a second concussion, such as contact or recreational sports, until your health care provider says it is okay.  Taking safety measures in your home.  Remove clutter and tripping hazards from floors and stairways.  Use grab bars in bathrooms and handrails by stairs.  Place non-slip mats on floors and in bathtubs.  Improve lighting in dim areas. SEEK MEDICAL CARE IF:  You have increased problems paying attention or  concentrating.  You have increased difficulty remembering or learning new information.  You need more time to complete tasks or assignments than before.  You have increased irritability or decreased ability to cope with stress.  You have more symptoms than before. Seek medical care if you have any of the following symptoms for more than 2 weeks after your injury:  Lasting (chronic) headaches.  Dizziness or balance problems.  Nausea.  Vision problems.  Increased sensitivity to noise or light.  Depression or mood swings.  Anxiety or irritability.  Memory problems.  Difficulty concentrating or paying attention.  Sleep problems.  Feeling tired all the time. SEEK IMMEDIATE MEDICAL CARE IF:  You have severe or worsening headaches. These may be a sign of a blood clot in the brain.  You have weakness (even if only in one hand, leg, or part of the face).  You have numbness.  You have decreased coordination.  You vomit repeatedly.  You have increased sleepiness.  One pupil is larger than the other.  You have convulsions.  You have slurred speech.  You have increased confusion. This may be a sign of a blood clot in the  brain.  You have increased restlessness, agitation, or irritability.  You are unable to recognize people or places.  You have neck pain.  It is difficult to wake you up.  You have unusual behavior changes.  You lose consciousness. MAKE SURE YOU:  Understand these instructions.  Will watch your condition.  Will get help right away if you are not doing well or get worse. Document Released: 01/11/2004 Document Revised: 10/26/2013 Document Reviewed: 05/13/2013 [facility] Patient Information 2015 [place], [place]. This information is not intended to replace advice given to you by your health care provider. Make sure you discuss any questions you have with your health care provider.
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
## 2015-01-09 ENCOUNTER — Telehealth: Payer: Self-pay

## 2015-01-09 ENCOUNTER — Other Ambulatory Visit: Payer: Self-pay | Admitting: Family Medicine
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
## 2015-01-10 ENCOUNTER — Other Ambulatory Visit: Payer: Self-pay

## 2015-01-10 MED ORDER — DIAZEPAM 5 MG PO TABS: 5.0000 mg | ORAL_TABLET | Freq: Three times a day (TID) | ORAL | Status: DC | PRN
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
## 2015-01-10 NOTE — Telephone Encounter (Signed)
Patient will call and make appt with her optho and is aware she has a limited supply of valium called in

## 2015-01-11 ENCOUNTER — Ambulatory Visit (INDEPENDENT_AMBULATORY_CARE_PROVIDER_SITE_OTHER): Payer: Managed Care, Other (non HMO) | Admitting: Neurology

## 2015-01-11 ENCOUNTER — Encounter: Payer: Self-pay | Admitting: Neurology

## 2015-01-11 VITALS — BP 122/72 | HR 68 | Ht 66.0 in | Wt 160.0 lb

## 2015-01-11 DIAGNOSIS — G43719 Chronic migraine without aura, intractable, without status migrainosus: Secondary | ICD-10-CM

## 2015-01-11 MED ORDER — TRAMADOL HCL 50 MG PO TABS: 50.0000 mg | ORAL_TABLET | Freq: Four times a day (QID) | ORAL | Status: DC | PRN
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
## 2015-01-12 ENCOUNTER — Encounter: Payer: Managed Care, Other (non HMO) | Admitting: Obstetrics and Gynecology

## 2015-01-13 ENCOUNTER — Other Ambulatory Visit: Payer: Self-pay | Admitting: Family Medicine

## 2015-01-14 ENCOUNTER — Other Ambulatory Visit: Payer: Self-pay | Admitting: Internal Medicine

## 2015-01-16 ENCOUNTER — Other Ambulatory Visit: Payer: Self-pay | Admitting: Family Medicine

## 2015-01-19 DIAGNOSIS — M6250 Muscle wasting and atrophy, not elsewhere classified, unspecified site: Secondary | ICD-10-CM | POA: Diagnosis not present

## 2015-01-19 DIAGNOSIS — M1990 Unspecified osteoarthritis, unspecified site: Secondary | ICD-10-CM | POA: Diagnosis not present

## 2015-01-19 DIAGNOSIS — G894 Chronic pain syndrome: Secondary | ICD-10-CM | POA: Diagnosis not present

## 2015-01-19 DIAGNOSIS — M961 Postlaminectomy syndrome, not elsewhere classified: Secondary | ICD-10-CM | POA: Diagnosis not present

## 2015-01-20 ENCOUNTER — Encounter: Payer: Managed Care, Other (non HMO) | Admitting: Obstetrics and Gynecology

## 2015-01-20 ENCOUNTER — Ambulatory Visit (HOSPITAL_COMMUNITY): Payer: Self-pay | Admitting: Psychiatry

## 2015-01-23 ENCOUNTER — Other Ambulatory Visit: Payer: Self-pay | Admitting: Internal Medicine

## 2015-01-23 ENCOUNTER — Ambulatory Visit (INDEPENDENT_AMBULATORY_CARE_PROVIDER_SITE_OTHER): Payer: Managed Care, Other (non HMO) | Admitting: Obstetrics and Gynecology

## 2015-01-23 ENCOUNTER — Encounter: Payer: Self-pay | Admitting: Obstetrics and Gynecology

## 2015-01-23 ENCOUNTER — Other Ambulatory Visit: Payer: Self-pay | Admitting: Family Medicine

## 2015-01-23 VITALS — BP 110/70 | Ht 66.0 in | Wt 163.0 lb

## 2015-01-23 DIAGNOSIS — N390 Urinary tract infection, site not specified: Secondary | ICD-10-CM

## 2015-01-23 DIAGNOSIS — R8290 Unspecified abnormal findings in urine: Secondary | ICD-10-CM

## 2015-01-23 LAB — POCT URINALYSIS DIPSTICK
Blood, UA: NEGATIVE
GLUCOSE UA: NEGATIVE
Ketones, UA: NEGATIVE
Leukocytes, UA: NEGATIVE
Nitrite, UA: POSITIVE
PROTEIN UA: NEGATIVE

## 2015-01-23 MED ORDER — SULFAMETHOXAZOLE-TRIMETHOPRIM 400-80 MG PO TABS: 1.0000 | ORAL_TABLET | Freq: Two times a day (BID) | ORAL | Status: DC

## 2015-01-23 NOTE — Progress Notes (Signed)
Patient ID: Jane English, female   DOB: [birth year], 44 y.o.   MRN: [id number] Pt here today for menstrual problem. Pt states that she had not had a period since October of 2015 until last month and she has not had a period since then. Pt states that the period started out dark brownish for the first few days, then she had bright red bleeding. Pt states that she was having the cramping like she should start her period but then she wouldn't start her period.

## 2015-01-23 NOTE — Progress Notes (Signed)
Patient ID: Jane English, female   DOB: [birth year], 44 y.o.   MRN: [id number]    [facility] Visit  Patient name: Jane English MRN [id number]  Date of birth: July 18, 1971  CC & HPI:  Jane English is a 44 y.o. female s/p tubal ligation, right oophorectomy and partial hysterectomy presenting today for concerns regarding her menstrual cycle. Pt states she had menses in 08/2014 and then, most recently, in early 12/2014. She notes that she has monthly cramping consistent with menses, but they are not followed by a cycle. Pt received medicine from Dr Moshe Cipro to have a period and then had a period on 2/10..   ROS:  A complete 10 system review of systems was obtained and all systems are negative except as noted in the HPI and PMH.   Pertinent History Reviewed:   Reviewed: Significant for tubal ligation, oophorectomy and partial hysterectomy Medical         Past Medical History  Diagnosis Date   Migraine headache    Back pain    Obesity    Hypertension    DVT (deep venous thrombosis) 2010   Heart murmur     no cardiologist   Asthma    Bronchitis    Depression    Diabetes mellitus without complication    Obsessive-compulsive disorder    PTSD (post-traumatic stress disorder)    Shortness of breath    GERD (gastroesophageal reflux disease)    Anemia    PSYCHOTIC D/O W/HALLUCINATIONS CONDS CLASS ELSW 03/04/2010    Qualifier: Diagnosis of  By: Moshe Cipro MD, Margaret     Asthma flare 04/09/2013   Seasonal allergies 12/10/2012   Chronic abdominal pain    Chronic constipation    Diabetes mellitus, type II    SBO (small bowel obstruction) 65/07/9356   Helicobacter pylori gastritis 06/11/2013    Colonoscopy Dr. Hilarie Fredrickson                              Surgical Hx:    Past Surgical History  Procedure Laterality Date   Appendectomy  1986   Carpal tunnel release Bilateral    Tubal ligation  1994   Lumbar spine surgery  2010    x 3   Lysis of adhesion  2003    Dr.  Irving Shows   Trigger finger release  2009    right pinkie finger   Anterior cervical decomp/discectomy fusion  07/07/2012    Procedure: ANTERIOR CERVICAL DECOMPRESSION/DISCECTOMY FUSION 2 LEVELS;  Surgeon: Floyce Stakes, MD;  Location: [facility];  Service: Neurosurgery;  Laterality: N/A;  Cervical four-five, five - six  Anterior cervical decompression/diskectomy/fusion/plate   Partial hysterectomy  1990s?    [place]   Spinal cord stimulator implant     Oophorectomy     Laparoscopy N/A 07/29/2013    Procedure: diagnostic laporoscopy;  Surgeon: Adin Hector, MD;  Location: [facility];  Service: General;  Laterality: N/A;   Lysis of adhesion N/A 07/29/2013    Procedure: LYSIS OF ADHESION;  Surgeon: Adin Hector, MD;  Location: [facility];  Service: General;  Laterality: N/A;   Bowel resection N/A 07/29/2013    Procedure: serosal repair;  Surgeon: Adin Hector, MD;  Location: [facility];  Service: General;  Laterality: N/A;   Laparoscopy N/A 08/16/2013    Procedure: LAPAROSCOPY DIAGNOSTIC/LYSIS OF ADHESIONS;  Surgeon: Adin Hector, MD;  Location: [facility];  Service: General;  Laterality: N/A;   Laparotomy N/A 08/16/2013    Procedure: EXPLORATORY LAPAROTOMY/SMALL BOWEL RESECTION (JEJUNUM);  Surgeon: Adin Hector, MD;  Location: [facility];  Service: General;  Laterality: N/A;   Medications: Reviewed & Updated - see associated section                       Current outpatient prescriptions:    acetaminophen (TYLENOL) 500 MG tablet, Take 1,000 mg by mouth every 6 (six) hours as needed for mild pain., Disp: , Rfl:    albuterol (PROVENTIL HFA;VENTOLIN HFA) 108 (90 BASE) MCG/ACT inhaler, Inhale 2 puffs into the lungs every 6 (six) hours as needed for wheezing or shortness of breath., Disp: , Rfl:    albuterol (PROVENTIL) (2.5 MG/3ML) 0.083% nebulizer solution, Take 3 mLs (2.5 mg total) by nebulization every 6 (six) hours as needed. Use one vial per nebulizer every 6 hours as needed  for wheezing, Disp: 75 mL, Rfl: 5   ALPRAZolam (XANAX) 1 MG tablet, Take 1 tablet (1 mg total) by mouth 3 (three) times daily as needed for anxiety. (Patient taking differently: Take 1 mg by mouth See admin instructions. Take 1 tablet in the morning and 1 tablet in the evening. Can take one during the day if needed for anxiety.), Disp: 90 tablet, Rfl: 2   AMITIZA 24 MCG capsule, TAKE ONE CAPSULE BY MOUTH TWICE A DAY WITH FOOD, Disp: 60 capsule, Rfl: 3   amLODipine (NORVASC) 10 MG tablet, Take 10 mg by mouth every morning., Disp: , Rfl:    beclomethasone (QVAR) 40 MCG/ACT inhaler, Inhale 2 puffs into the lungs 2 (two) times daily., Disp: 1 Inhaler, Rfl: 12   Botulinum Toxin Type A 200 UNITS SOLR, Inject 200 Units as directed every 3 (three) months. , Disp: , Rfl:    carbamazepine (TEGRETOL) 100 MG chewable tablet, Take two in the am and three at night, Disp: 150 tablet, Rfl: 2   cetirizine (ZYRTEC) 10 MG tablet, Take 10 mg by mouth daily., Disp: , Rfl:    cloNIDine (CATAPRES) 0.1 MG tablet, TAKE 1 TABLET AT BEDTIME FOR HYPERTENSION, Disp: 30 tablet, Rfl: 3   diazepam (VALIUM) 5 MG tablet, Take 1 tablet (5 mg total) by mouth every 8 (eight) hours as needed for muscle spasms., Disp: 12 tablet, Rfl: 0   Elastic Bandages & Supports (ABDOMINAL BINDER/ELASTIC MED) MISC, 1 application by Does not apply route daily., Disp: 1 each, Rfl: 0   escitalopram (LEXAPRO) 20 MG tablet, Take 1 tablet (20 mg total) by mouth daily., Disp: 30 tablet, Rfl: 2   Fe Fum-FePoly-Vit C-Vit B3 (INTEGRA) 62.5-62.5-40-3 MG CAPS, Take 1 capsule by mouth daily., Disp: 30 capsule, Rfl: 3   fentaNYL (DURAGESIC - DOSED MCG/HR) 25 MCG/HR patch, Place onto the skin., Disp: , Rfl:    fluticasone (FLONASE) 50 MCG/ACT nasal spray, PLACE 2 SPRAYS INTO BOTH NOSTRILS DAILY. (Patient taking differently: PLACE 2 SPRAYS INTO BOTH NOSTRILS DAILY AS NEEDED FOR ALLERGIES.), Disp: 16 g, Rfl: 2   ibuprofen (ADVIL,MOTRIN) 600 MG tablet,  Take 1 tablet (600 mg total) by mouth every 8 (eight) hours as needed. (Patient taking differently: Take 600 mg by mouth every 8 (eight) hours as needed for moderate pain. ), Disp: 15 tablet, Rfl: 0   ipratropium (ATROVENT) 0.02 % nebulizer solution, NEBULIZE 1 VIAL EVERY 6 HOURS AS NEEDED FOR WHEEZING, Disp: 75 mL, Rfl: 1   Linaclotide (LINZESS) 145 MCG CAPS capsule, Take 145 mcg by mouth  daily., Disp: , Rfl:    medroxyPROGESTERone (PROVERA) 5 MG tablet, Take 1 tablet (5 mg total) by mouth 2 (two) times daily., Disp: 10 tablet, Rfl: 0   montelukast (SINGULAIR) 10 MG tablet, take 1 tablet by mouth at bedtime, Disp: 30 tablet, Rfl: 4   montelukast (SINGULAIR) 10 MG tablet, TAKE 1 TABLET (10 MG TOTAL) BY MOUTH AT BEDTIME., Disp: 30 tablet, Rfl: 2   olopatadine (PATANOL) 0.1 % ophthalmic solution, PLACE 1 DROP INTO BOTH EYES 2 (TWO) TIMES DAILY., Disp: 5 mL, Rfl: 0   Oxycodone HCl 10 MG TABS, Take 10 mg by mouth., Disp: , Rfl:    pantoprazole (PROTONIX) 40 MG tablet, TAKE 1 TABLET (40 MG TOTAL) BY MOUTH DAILY., Disp: 30 tablet, Rfl: 0   potassium chloride 20 MEQ/15ML (10%) SOLN, TAKE 15MLS TWICE A DAY, Disp: 473 mL, Rfl: 1   spironolactone (ALDACTONE) 25 MG tablet, Take 1 tablet (25 mg total) by mouth daily., Disp: 30 tablet, Rfl: 5   spironolactone (ALDACTONE) 25 MG tablet, TAKE 1 TABLET BY MOUTH EVERY DAY, Disp: 30 tablet, Rfl: 5   spironolactone (ALDACTONE) 25 MG tablet, TAKE 1 TABLET BY MOUTH EVERY DAY, Disp: 30 tablet, Rfl: 2   topiramate (TOPAMAX) 100 MG tablet, TAKE 1 TABLET TWICE A DAY, Disp: 60 tablet, Rfl: 3   traMADol (ULTRAM) 50 MG tablet, Take 1 tablet (50 mg total) by mouth every 6 (six) hours as needed., Disp: 30 tablet, Rfl: 3   triamterene-hydrochlorothiazide (MAXZIDE) 75-50 MG per tablet, TAKE 1 TABLET BY MOUTH DAILY (Patient taking differently: TAKE 0.5 TABLET ONCE DAILY.), Disp: 30 tablet, Rfl: 4   zolpidem (AMBIEN CR) 12.5 MG CR tablet, Take 1 tablet (12.5 mg total)  by mouth at bedtime as needed for sleep., Disp: 30 tablet, Rfl: 1   Social History: Reviewed -  reports that she has never smoked. She has never used smokeless tobacco.  Objective Findings:  Vitals: Blood pressure 110/70, height 5\' 6"  (1.676 m), weight 163 lb (73.936 kg), last menstrual period 12/14/2014.  Physical Examination: discussion only 15 min   Assessment & Plan:   A:  1. Nitrates present in the urine= uti 2. Bowel strictures following bowel resection in 2014 3. LNMP 08/2014 s/p withdrawal with progesterone 4. S/p right oophorectomy 5. Rule out menopause P:  1.  Check FSH/Progesterone  2. Rx septra for uti    This chart was scribed for Jonnie Kind, MD by Tula Nakayama, ED Scribe. This patient was seen in room 1 and the patient's care was started at 3:06 PM.   I personally performed the services described in this documentation, which was SCRIBED in my presence. The recorded information has been reviewed and considered accurate. It has been edited as necessary during review. Jonnie Kind, MD

## 2015-01-23 NOTE — Addendum Note (Signed)
Addended by: Jonnie Kind on: 01/23/2015 03:28 PM   Modules accepted: Orders

## 2015-01-24 LAB — URINALYSIS, ROUTINE W REFLEX MICROSCOPIC
BILIRUBIN UA: NEGATIVE
GLUCOSE, UA: NEGATIVE
Ketones, UA: NEGATIVE
Leukocytes, UA: NEGATIVE
Nitrite, UA: POSITIVE — AB
Protein, UA: NEGATIVE
RBC UA: NEGATIVE
Specific Gravity, UA: 1.019 (ref 1.005–1.030)
Urobilinogen, Ur: 0.2 mg/dL (ref 0.2–1.0)
pH, UA: 6.5 (ref 5.0–7.5)

## 2015-01-24 LAB — MICROSCOPIC EXAMINATION: Casts: NONE SEEN /lpf

## 2015-01-24 LAB — PROGESTERONE

## 2015-01-24 LAB — FOLLICLE STIMULATING HORMONE: FSH: 15.3 m[IU]/mL

## 2015-01-25 LAB — URINE CULTURE

## 2015-01-26 ENCOUNTER — Other Ambulatory Visit: Payer: Self-pay | Admitting: Internal Medicine

## 2015-01-30 ENCOUNTER — Encounter: Payer: Managed Care, Other (non HMO) | Admitting: Family Medicine

## 2015-02-01 ENCOUNTER — Other Ambulatory Visit: Payer: Self-pay | Admitting: Family Medicine

## 2015-02-01 ENCOUNTER — Telehealth: Payer: Self-pay | Admitting: Internal Medicine

## 2015-02-01 MED ORDER — PANTOPRAZOLE SODIUM 40 MG PO TBEC: DELAYED_RELEASE_TABLET | ORAL | Status: DC

## 2015-02-01 NOTE — Telephone Encounter (Signed)
Rx sent until appt 03/30/15.

## 2015-02-06 ENCOUNTER — Encounter: Payer: Self-pay | Admitting: Obstetrics and Gynecology

## 2015-02-06 ENCOUNTER — Ambulatory Visit (INDEPENDENT_AMBULATORY_CARE_PROVIDER_SITE_OTHER): Payer: Managed Care, Other (non HMO) | Admitting: Obstetrics and Gynecology

## 2015-02-06 VITALS — BP 100/60 | Ht 66.0 in | Wt 169.0 lb

## 2015-02-06 DIAGNOSIS — N970 Female infertility associated with anovulation: Secondary | ICD-10-CM | POA: Insufficient documentation

## 2015-02-06 DIAGNOSIS — N8320 Unspecified ovarian cysts: Secondary | ICD-10-CM

## 2015-02-06 DIAGNOSIS — N911 Secondary amenorrhea: Secondary | ICD-10-CM | POA: Diagnosis not present

## 2015-02-06 DIAGNOSIS — N83202 Unspecified ovarian cyst, left side: Secondary | ICD-10-CM | POA: Insufficient documentation

## 2015-02-06 MED ORDER — MEDROXYPROGESTERONE ACETATE 10 MG PO TABS: 10.0000 mg | ORAL_TABLET | Freq: Every day | ORAL | Status: DC

## 2015-02-06 NOTE — Progress Notes (Signed)
Patient ID: Jane English, female   DOB: 05/22/1971, 44 y.o.   MRN: [id number]    [facility] Visit  Patient name: Jane English MRN [id number]  Date of birth: 08/24/1971  CC & HPI:  Jane English is a 44 y.o. female presenting today for follow-up of ovarian cyst. Pt had [place] and lab work which showed pre-menopausal [place] levels and an enlarged uterus. Her last menses started on 2/10.   ROS:  A complete 10 system review of systems was obtained and all systems are negative except as noted in the HPI and PMH.    Pertinent History Reviewed:   Reviewed: Significant for tubal ligation Medical         Past Medical History  Diagnosis Date  . Migraine headache   . Back pain   . Obesity   . Hypertension   . DVT (deep venous thrombosis) 2010  . Heart murmur     no cardiologist  . Asthma   . Bronchitis   . Depression   . Diabetes mellitus without complication   . Obsessive-compulsive disorder   . PTSD (post-traumatic stress disorder)   . Shortness of breath   . GERD (gastroesophageal reflux disease)   . Anemia   . PSYCHOTIC D/O W/HALLUCINATIONS CONDS CLASS ELSW 03/04/2010    Qualifier: Diagnosis of  By: Moshe Cipro MD, Joycelyn Schmid    . Asthma flare 04/09/2013  . Seasonal allergies 12/10/2012  . Chronic abdominal pain   . Chronic constipation   . Diabetes mellitus, type II   . SBO (small bowel obstruction) 08/09/2013  . Helicobacter pylori gastritis 06/11/2013    Colonoscopy Dr. Hilarie Fredrickson                              Surgical Hx:    Past Surgical History  Procedure Laterality Date  . Appendectomy  1986  . Carpal tunnel release Bilateral   . Tubal ligation  1994  . Lumbar spine surgery  2010    x 3  . Lysis of adhesion  2003    Dr. Irving Shows  . Trigger finger release  2009    right pinkie finger  . Anterior cervical decomp/discectomy fusion  07/07/2012    Procedure: ANTERIOR CERVICAL DECOMPRESSION/DISCECTOMY FUSION 2 LEVELS;  Surgeon: Floyce Stakes, MD;  Location: [facility];   Service: Neurosurgery;  Laterality: N/A;  Cervical four-five, five - six  Anterior cervical decompression/diskectomy/fusion/plate  . Partial hysterectomy  1990s?    [place], [place]  . Spinal cord stimulator implant    . Oophorectomy    . Laparoscopy N/A 07/29/2013    Procedure: diagnostic laporoscopy;  Surgeon: Adin Hector, MD;  Location: [facility];  Service: General;  Laterality: N/A;  . Lysis of adhesion N/A 07/29/2013    Procedure: LYSIS OF ADHESION;  Surgeon: Adin Hector, MD;  Location: [facility];  Service: General;  Laterality: N/A;  . Bowel resection N/A 07/29/2013    Procedure: serosal repair;  Surgeon: Adin Hector, MD;  Location: [facility];  Service: General;  Laterality: N/A;  . Laparoscopy N/A 08/16/2013    Procedure: LAPAROSCOPY DIAGNOSTIC/LYSIS OF ADHESIONS;  Surgeon: Adin Hector, MD;  Location: [facility];  Service: General;  Laterality: N/A;  . Laparotomy N/A 08/16/2013    Procedure: EXPLORATORY LAPAROTOMY/SMALL BOWEL RESECTION (JEJUNUM);  Surgeon: Adin Hector, MD;  Location: [facility];  Service: General;  Laterality: N/A;   Medications: Reviewed &  Updated - see associated section                       Current outpatient prescriptions:  .  acetaminophen (TYLENOL) 500 MG tablet, Take 1,000 mg by mouth every 6 (six) hours as needed for mild pain., Disp: , Rfl:  .  albuterol (PROVENTIL HFA;VENTOLIN HFA) 108 (90 BASE) MCG/ACT inhaler, Inhale 2 puffs into the lungs every 6 (six) hours as needed for wheezing or shortness of breath., Disp: , Rfl:  .  albuterol (PROVENTIL) (2.5 MG/3ML) 0.083% nebulizer solution, Take 3 mLs (2.5 mg total) by nebulization every 6 (six) hours as needed. Use one vial per nebulizer every 6 hours as needed for wheezing, Disp: 75 mL, Rfl: 5 .  ALPRAZolam (XANAX) 1 MG tablet, Take 1 tablet (1 mg total) by mouth 3 (three) times daily as needed for anxiety. (Patient taking differently: Take 1 mg by mouth See admin instructions. Take 1 tablet in the morning and 1  tablet in the evening. Can take one during the day if needed for anxiety.), Disp: 90 tablet, Rfl: 2 .  AMITIZA 24 MCG capsule, TAKE ONE CAPSULE BY MOUTH TWICE A DAY WITH FOOD, Disp: 60 capsule, Rfl: 3 .  amLODipine (NORVASC) 10 MG tablet, Take 10 mg by mouth every morning., Disp: , Rfl:  .  beclomethasone (QVAR) 40 MCG/ACT inhaler, Inhale 2 puffs into the lungs 2 (two) times daily., Disp: 1 Inhaler, Rfl: 12 .  Botulinum Toxin Type A 200 UNITS SOLR, Inject 200 Units as directed every 3 (three) months. , Disp: , Rfl:  .  carbamazepine (TEGRETOL) 100 MG chewable tablet, Take two in the am and three at night, Disp: 150 tablet, Rfl: 2 .  cetirizine (ZYRTEC) 10 MG tablet, Take 10 mg by mouth daily., Disp: , Rfl:  .  cloNIDine (CATAPRES) 0.1 MG tablet, TAKE 1 TABLET AT BEDTIME FOR HYPERTENSION, Disp: 30 tablet, Rfl: 3 .  diazepam (VALIUM) 5 MG tablet, Take 1 tablet (5 mg total) by mouth every 8 (eight) hours as needed for muscle spasms., Disp: 12 tablet, Rfl: 0 .  Elastic Bandages & Supports (ABDOMINAL BINDER/ELASTIC MED) MISC, 1 application by Does not apply route daily., Disp: 1 each, Rfl: 0 .  escitalopram (LEXAPRO) 20 MG tablet, Take 1 tablet (20 mg total) by mouth daily., Disp: 30 tablet, Rfl: 2 .  Fe Fum-FePoly-Vit C-Vit B3 (INTEGRA) 62.5-62.5-40-3 MG CAPS, Take 1 capsule by mouth daily., Disp: 30 capsule, Rfl: 3 .  fentaNYL (DURAGESIC - DOSED MCG/HR) 25 MCG/HR patch, Place onto the skin., Disp: , Rfl:  .  fluticasone (FLONASE) 50 MCG/ACT nasal spray, PLACE 2 SPRAYS INTO BOTH NOSTRILS DAILY. (Patient taking differently: PLACE 2 SPRAYS INTO BOTH NOSTRILS DAILY AS NEEDED FOR ALLERGIES.), Disp: 16 g, Rfl: 2 .  ibuprofen (ADVIL,MOTRIN) 600 MG tablet, Take 1 tablet (600 mg total) by mouth every 8 (eight) hours as needed. (Patient taking differently: Take 600 mg by mouth every 8 (eight) hours as needed for moderate pain. ), Disp: 15 tablet, Rfl: 0 .  ipratropium (ATROVENT) 0.02 % nebulizer solution, NEBULIZE  1 VIAL EVERY 6 HOURS AS NEEDED FOR WHEEZING, Disp: 75 mL, Rfl: 1 .  Linaclotide (LINZESS) 145 MCG CAPS capsule, Take 145 mcg by mouth daily., Disp: , Rfl:  .  medroxyPROGESTERone (PROVERA) 5 MG tablet, Take 1 tablet (5 mg total) by mouth 2 (two) times daily., Disp: 10 tablet, Rfl: 0 .  montelukast (SINGULAIR) 10 MG tablet, take 1 tablet by  mouth at bedtime, Disp: 30 tablet, Rfl: 4 .  montelukast (SINGULAIR) 10 MG tablet, TAKE 1 TABLET (10 MG TOTAL) BY MOUTH AT BEDTIME., Disp: 30 tablet, Rfl: 2 .  olopatadine (PATANOL) 0.1 % ophthalmic solution, PLACE 1 DROP INTO BOTH EYES 2 (TWO) TIMES DAILY., Disp: 5 mL, Rfl: 0 .  Oxycodone HCl 10 MG TABS, Take 10 mg by mouth., Disp: , Rfl:  .  pantoprazole (PROTONIX) 40 MG tablet, TAKE 1 TABLET (40 MG TOTAL) BY MOUTH DAILY., Disp: 30 tablet, Rfl: 1 .  potassium chloride 20 MEQ/15ML (10%) SOLN, TAKE 15MLS BY MOUTH TWICE A DAY, Disp: 473 mL, Rfl: 1 .  spironolactone (ALDACTONE) 25 MG tablet, Take 1 tablet (25 mg total) by mouth daily., Disp: 30 tablet, Rfl: 5 .  spironolactone (ALDACTONE) 25 MG tablet, TAKE 1 TABLET BY MOUTH EVERY DAY, Disp: 30 tablet, Rfl: 5 .  spironolactone (ALDACTONE) 25 MG tablet, TAKE 1 TABLET BY MOUTH EVERY DAY, Disp: 30 tablet, Rfl: 2 .  spironolactone (ALDACTONE) 25 MG tablet, TAKE 1 TABLET BY MOUTH EVERY DAY, Disp: 30 tablet, Rfl: 5 .  sulfamethoxazole-trimethoprim (BACTRIM) 400-80 MG per tablet, Take 1 tablet by mouth 2 (two) times daily. Generic acceptable, Disp: 14 tablet, Rfl: 0 .  topiramate (TOPAMAX) 100 MG tablet, TAKE 1 TABLET TWICE A DAY, Disp: 60 tablet, Rfl: 3 .  traMADol (ULTRAM) 50 MG tablet, Take 1 tablet (50 mg total) by mouth every 6 (six) hours as needed., Disp: 30 tablet, Rfl: 3 .  triamterene-hydrochlorothiazide (MAXZIDE) 75-50 MG per tablet, TAKE 1 TABLET BY MOUTH DAILY (Patient taking differently: TAKE 0.5 TABLET ONCE DAILY.), Disp: 30 tablet, Rfl: 4 .  zolpidem (AMBIEN CR) 12.5 MG CR tablet, Take 1 tablet (12.5 mg  total) by mouth at bedtime as needed for sleep., Disp: 30 tablet, Rfl: 1   Social History: Reviewed -  reports that she has never smoked. She has never used smokeless tobacco.  Objective Findings:  Vitals: Blood pressure 100/60, height 5\' 6"  (1.676 m), weight 169 lb (76.658 kg), last menstrual period 12/14/2014.  Physical Examination: Discussion only 20 min   Assessment & Plan:   A:premenopausal amenorrhea    Suspected anovulation 2. Enlarged uterus due to fibroids, currently stable   P:  1. Provera if no menses by 5/1 2. Follow-up prn in 04/2015 with no menses 3  Follow uterine size annually 4. Mammogram q yr    This chart was scribed for Jonnie Kind, MD by Tula Nakayama, ED Scribe. This patient was seen in conference room and the patient's care was started at 3:57 PM.   I personally performed the services described in this documentation, which was SCRIBED in my presence. The recorded information has been reviewed and considered accurate. It has been edited as necessary during review. Jonnie Kind, MD

## 2015-02-06 NOTE — Progress Notes (Signed)
Patient ID: Jane English, female   DOB: [birth year], 44 y.o.   MRN: [id number] Pt here today for follow up and to go over results.

## 2015-02-09 ENCOUNTER — Other Ambulatory Visit: Payer: Self-pay | Admitting: Obstetrics and Gynecology

## 2015-02-15 ENCOUNTER — Telehealth: Payer: Self-pay | Admitting: Family Medicine

## 2015-02-15 ENCOUNTER — Other Ambulatory Visit: Payer: Self-pay

## 2015-02-15 MED ORDER — ALBUTEROL SULFATE HFA 108 (90 BASE) MCG/ACT IN AERS: 2.0000 | INHALATION_SPRAY | Freq: Four times a day (QID) | RESPIRATORY_TRACT | Status: DC | PRN

## 2015-02-15 MED ORDER — BECLOMETHASONE DIPROPIONATE 40 MCG/ACT IN AERS: 2.0000 | INHALATION_SPRAY | Freq: Two times a day (BID) | RESPIRATORY_TRACT | Status: DC

## 2015-02-15 NOTE — Telephone Encounter (Signed)
Meds sent, not on symbicort. Qvar sent instead

## 2015-02-28 ENCOUNTER — Other Ambulatory Visit (HOSPITAL_COMMUNITY): Payer: Self-pay | Admitting: Psychiatry

## 2015-02-28 ENCOUNTER — Ambulatory Visit (HOSPITAL_COMMUNITY): Payer: Self-pay | Admitting: Psychiatry

## 2015-03-03 ENCOUNTER — Encounter (HOSPITAL_COMMUNITY): Payer: Self-pay | Admitting: Psychiatry

## 2015-03-03 ENCOUNTER — Ambulatory Visit (INDEPENDENT_AMBULATORY_CARE_PROVIDER_SITE_OTHER): Payer: Managed Care, Other (non HMO) | Admitting: Psychiatry

## 2015-03-03 VITALS — BP 120/76 | HR 95 | Ht 66.0 in | Wt 164.0 lb

## 2015-03-03 DIAGNOSIS — F4310 Post-traumatic stress disorder, unspecified: Secondary | ICD-10-CM

## 2015-03-03 DIAGNOSIS — F3130 Bipolar disorder, current episode depressed, mild or moderate severity, unspecified: Secondary | ICD-10-CM

## 2015-03-03 MED ORDER — ZOLPIDEM TARTRATE ER 12.5 MG PO TBCR: 12.5000 mg | EXTENDED_RELEASE_TABLET | Freq: Every evening | ORAL | Status: DC | PRN

## 2015-03-03 MED ORDER — ALPRAZOLAM 1 MG PO TABS: 1.0000 mg | ORAL_TABLET | Freq: Three times a day (TID) | ORAL | Status: DC | PRN

## 2015-03-03 MED ORDER — CARBAMAZEPINE 100 MG PO CHEW: CHEWABLE_TABLET | ORAL | Status: DC

## 2015-03-03 MED ORDER — ESCITALOPRAM OXALATE 20 MG PO TABS: 20.0000 mg | ORAL_TABLET | Freq: Every day | ORAL | Status: DC
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
## 2015-03-14 ENCOUNTER — Ambulatory Visit (HOSPITAL_COMMUNITY): Payer: Self-pay | Admitting: Psychiatry

## 2015-03-16 ENCOUNTER — Encounter (HOSPITAL_COMMUNITY): Payer: Self-pay | Admitting: *Deleted

## 2015-03-17 DIAGNOSIS — M1711 Unilateral primary osteoarthritis, right knee: Secondary | ICD-10-CM | POA: Insufficient documentation

## 2015-03-21 ENCOUNTER — Encounter: Payer: Self-pay | Admitting: *Deleted

## 2015-03-21 DIAGNOSIS — G43719 Chronic migraine without aura, intractable, without status migrainosus: Secondary | ICD-10-CM

## 2015-03-22 ENCOUNTER — Other Ambulatory Visit: Payer: Self-pay | Admitting: Family Medicine

## 2015-03-22 ENCOUNTER — Encounter: Payer: Self-pay | Admitting: *Deleted

## 2015-03-22 ENCOUNTER — Ambulatory Visit (INDEPENDENT_AMBULATORY_CARE_PROVIDER_SITE_OTHER): Payer: Managed Care, Other (non HMO) | Admitting: Neurology

## 2015-03-22 ENCOUNTER — Encounter: Payer: Self-pay | Admitting: Neurology

## 2015-03-22 ENCOUNTER — Telehealth (HOSPITAL_COMMUNITY): Payer: Self-pay | Admitting: *Deleted

## 2015-03-22 VITALS — BP 112/78 | HR 86 | Ht 66.0 in | Wt 165.0 lb

## 2015-03-22 DIAGNOSIS — G43719 Chronic migraine without aura, intractable, without status migrainosus: Secondary | ICD-10-CM | POA: Diagnosis not present

## 2015-03-22 DIAGNOSIS — G43711 Chronic migraine without aura, intractable, with status migrainosus: Secondary | ICD-10-CM

## 2015-03-22 MED ORDER — ONABOTULINUMTOXINA 100 UNITS IJ SOLR
200.0000 [IU] | Freq: Once | INTRAMUSCULAR | Status: AC
  Administered 2015-03-22: 200 [IU] via INTRAMUSCULAR

## 2015-03-22 MED ORDER — TRAMADOL HCL 50 MG PO TABS: 50.0000 mg | ORAL_TABLET | Freq: Four times a day (QID) | ORAL | Status: DC | PRN

## 2015-03-22 NOTE — Progress Notes (Signed)
**  Botox NGE#[id number], Exp 08/2017**mck,rn
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
## 2015-03-27 ENCOUNTER — Other Ambulatory Visit: Payer: Self-pay | Admitting: Family Medicine

## 2015-03-27 ENCOUNTER — Other Ambulatory Visit: Payer: Self-pay | Admitting: Internal Medicine

## 2015-04-04 ENCOUNTER — Other Ambulatory Visit (INDEPENDENT_AMBULATORY_CARE_PROVIDER_SITE_OTHER): Payer: Managed Care, Other (non HMO)

## 2015-04-04 ENCOUNTER — Ambulatory Visit (INDEPENDENT_AMBULATORY_CARE_PROVIDER_SITE_OTHER): Payer: Managed Care, Other (non HMO) | Admitting: Internal Medicine

## 2015-04-04 ENCOUNTER — Encounter: Payer: Self-pay | Admitting: Internal Medicine

## 2015-04-04 VITALS — BP 110/70 | Ht 66.0 in | Wt 161.0 lb

## 2015-04-04 DIAGNOSIS — K219 Gastro-esophageal reflux disease without esophagitis: Secondary | ICD-10-CM

## 2015-04-04 DIAGNOSIS — K660 Peritoneal adhesions (postprocedural) (postinfection): Secondary | ICD-10-CM

## 2015-04-04 DIAGNOSIS — G8929 Other chronic pain: Secondary | ICD-10-CM

## 2015-04-04 DIAGNOSIS — K5900 Constipation, unspecified: Secondary | ICD-10-CM | POA: Diagnosis not present

## 2015-04-04 DIAGNOSIS — R1031 Right lower quadrant pain: Secondary | ICD-10-CM

## 2015-04-04 DIAGNOSIS — D509 Iron deficiency anemia, unspecified: Secondary | ICD-10-CM

## 2015-04-04 DIAGNOSIS — R1032 Left lower quadrant pain: Secondary | ICD-10-CM

## 2015-04-04 LAB — CBC WITH DIFFERENTIAL/PLATELET
BASOS ABS: 0 10*3/uL (ref 0.0–0.1)
Basophils Relative: 0.3 % (ref 0.0–3.0)
EOS ABS: 0.4 10*3/uL (ref 0.0–0.7)
Eosinophils Relative: 5.3 % — ABNORMAL HIGH (ref 0.0–5.0)
HCT: 37.2 % (ref 36.0–46.0)
Hemoglobin: 12.3 g/dL (ref 12.0–15.0)
Lymphocytes Relative: 29.7 % (ref 12.0–46.0)
Lymphs Abs: 2.2 10*3/uL (ref 0.7–4.0)
MCHC: 33 g/dL (ref 30.0–36.0)
MCV: 94.1 fl (ref 78.0–100.0)
MONO ABS: 0.6 10*3/uL (ref 0.1–1.0)
Monocytes Relative: 8 % (ref 3.0–12.0)
NEUTROS PCT: 56.7 % (ref 43.0–77.0)
Neutro Abs: 4.2 10*3/uL (ref 1.4–7.7)
PLATELETS: 243 10*3/uL (ref 150.0–400.0)
RBC: 3.96 Mil/uL (ref 3.87–5.11)
RDW: 13.9 % (ref 11.5–15.5)
WBC: 7.4 10*3/uL (ref 4.0–10.5)

## 2015-04-04 LAB — IBC PANEL
IRON: 71 ug/dL (ref 42–145)
Saturation Ratios: 16.4 % — ABNORMAL LOW (ref 20.0–50.0)
Transferrin: 309 mg/dL (ref 212.0–360.0)

## 2015-04-04 LAB — VITAMIN B12: Vitamin B-12: 243 pg/mL (ref 211–911)

## 2015-04-04 LAB — FOLATE: Folate: 13.8 ng/mL (ref >5.9)

## 2015-04-04 LAB — FERRITIN: Ferritin: 12.9 ng/mL (ref 10.0–291.0)

## 2015-04-04 MED ORDER — LUBIPROSTONE 24 MCG PO CAPS: 24.0000 ug | ORAL_CAPSULE | Freq: Two times a day (BID) | ORAL | Status: DC

## 2015-04-04 MED ORDER — PANTOPRAZOLE SODIUM 40 MG PO TBEC: DELAYED_RELEASE_TABLET | ORAL | Status: DC

## 2015-04-04 MED ORDER — LINACLOTIDE 145 MCG PO CAPS: 145.0000 ug | ORAL_CAPSULE | Freq: Every day | ORAL | Status: DC

## 2015-04-04 NOTE — Patient Instructions (Signed)
Your physician has requested that you go to the basement for lab work before leaving today   Continue your Amitiza 2 times a day and Linzess once a day  We have sent the following medications to your pharmacy for you to pick up at your convenience: Amitiza, Linzess, Pantoprazole  Please follow up in 6 months

## 2015-04-04 NOTE — Progress Notes (Signed)
Subjective:    Patient ID: Jane English, female    DOB: [birth year], 44 y.o.   MRN: [id number]  HPI Jane English is a 44 year old female for the past medical history of multiple abdominal surgeries for lysis of adhesions, GERD, chronic constipation, H. pylori duodenitis status post treatment, migraines and hypertension who seen in follow-up. She was last seen in January 2015. Overall she is doing better but she still reports she's not the same and she was before her last abdominal surgery in October 2014. She is still having intermittent issues with significant constipation. Second opinion was given from [facility] and Linzess was added on an as-needed basis to lubiprostone 24 g twice daily. About a month ago she developed severe constipation and had a hard time evacuating her stool. She used enema but also states that her husband had to put on a glove and try to work the stool out. They used Vaseline to try to help ease passage. After she passed this heart stool she had bleeding and pain that lasted for several days. Bleeding has resolved. Appetite has been okay. Pantoprazole is helping significantly with heartburn and reflux. Her left lower and suprapubic pain are present but not as severe. No dysphagia or odynophagia. She denies nausea and vomiting. She is getting Botox treatment for her migraines and using tramadol on occasion for migraines. She does report fatigue and feeling cold and wonders if her iron is still low. Integra caused worsening constipation and states she can't take   Review of Systems As per history of present illness, otherwise negative  Current Medications, Allergies, Past Medical History, Past Surgical History, Family History and Social History were reviewed in [employer] record.     Objective:   Physical Exam BP 110/70 mmHg  Ht 5\' 6"  (1.676 m)  Wt 161 lb (73.029 kg)  BMI 26.00 kg/m2 Constitutional: Well-developed and well-nourished. No  distress. HEENT: Normocephalic and atraumatic. Oropharynx is clear and moist. No oropharyngeal exudate. Conjunctivae are normal.  No scleral icterus. Neck: Neck supple. Trachea midline. Cardiovascular: Normal rate, regular rhythm and intact distal pulses. No M/R/G Pulmonary/chest: Effort normal and breath sounds normal. No wheezing, rales or rhonchi. Abdominal: Soft, left-sided tenderness without rebound or guarding, nondistended. Bowel sounds active throughout.  Extremities: no clubbing, cyanosis, or edema Neurological: Alert and oriented to person place and time. Skin: Skin is warm and dry. No rashes noted. Psychiatric: Normal mood and affect. Behavior is normal.  CBC    Component Value Date/Time   WBC 7.3 10/17/2014 0128   RBC 3.63* 10/17/2014 0128   HGB 10.8* 10/17/2014 0128   HCT 33.2* 10/17/2014 0128   PLT 228 10/17/2014 0128   MCV 91.5 10/17/2014 0128   MCH 29.8 10/17/2014 0128   MCHC 32.5 10/17/2014 0128   RDW 13.6 10/17/2014 0128   LYMPHSABS 1.8 08/10/2014 0804   MONOABS 0.5 08/10/2014 0804   EOSABS 0.2 08/10/2014 0804   BASOSABS 0.0 08/10/2014 0804    Iron/TIBC/Ferritin/ %Sat    Component Value Date/Time   IRON 22* 02/03/2014 1646   FERRITIN 6* 02/03/2014 1646   IRONPCTSAT 6.8* 11/29/2013 0958   Colonoscopy 06/11/2013 -- normal terminal ileum, 4 mm polyp removed from the ascending colon otherwise normal mucosa, with all G = benign lymphoid polyp     Assessment & Plan:  44 year old female for the past medical history of multiple abdominal surgeries for lysis of adhesions, GERD, chronic constipation, H. pylori duodenitis status post treatment, migraines and hypertension who seen in  follow-up.  1. Chronic abd pain/chronic constipation/abdominal adhesive disease -- certainly we need to try to avoid constipation. Pain is improved and she is having more regular bowel movements. We'll continue Amitiza 24 g twice daily and start Linzess 145 g on a daily basis. We  discussed the main side effect is diarrhea and I asked that she call me if this occurs. She voices understanding  2. Iron deficiency anemia -- I suspect she is still anemic and this is likely contributing to her overall fatigue. She cannot tolerate oral iron due to severe constipation. Repeat CBC and iron studies today and if low I will recommend IV iron replacement. Check B12 and folate  Returning 6 months, sooner if necessary

## 2015-04-07 ENCOUNTER — Ambulatory Visit (HOSPITAL_COMMUNITY): Payer: Self-pay | Admitting: Psychiatry

## 2015-04-07 ENCOUNTER — Other Ambulatory Visit: Payer: Self-pay

## 2015-04-07 ENCOUNTER — Telehealth: Payer: Self-pay

## 2015-04-07 DIAGNOSIS — D509 Iron deficiency anemia, unspecified: Secondary | ICD-10-CM

## 2015-04-07 MED ORDER — VITAMIN B-12 1000 MCG PO TABS: 1000.0000 ug | ORAL_TABLET | Freq: Every day | ORAL | Status: DC

## 2015-04-07 NOTE — Telephone Encounter (Signed)
Patient walked into office today concerned about a bruise on the inside of her left upper thigh. She did not remember hitting the area. The area did not feel warm to the touch or painful although she did report the area had been itching before she discovered the bruise.I advised her if she developed pain in the area or the bruise started to spread or became red to go to the urgent care or ER. Patient understands

## 2015-04-14 ENCOUNTER — Ambulatory Visit (HOSPITAL_COMMUNITY): Payer: Self-pay | Admitting: Psychiatry

## 2015-04-18 ENCOUNTER — Ambulatory Visit (INDEPENDENT_AMBULATORY_CARE_PROVIDER_SITE_OTHER): Payer: Managed Care, Other (non HMO) | Admitting: Psychiatry

## 2015-04-18 DIAGNOSIS — F3130 Bipolar disorder, current episode depressed, mild or moderate severity, unspecified: Secondary | ICD-10-CM | POA: Diagnosis not present

## 2015-04-18 NOTE — Patient Instructions (Signed)
Discussed orally 
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
## 2015-04-24 ENCOUNTER — Other Ambulatory Visit: Payer: Self-pay | Admitting: Internal Medicine

## 2015-04-27 ENCOUNTER — Emergency Department (HOSPITAL_COMMUNITY)
Admission: EM | Admit: 2015-04-27 | Discharge: 2015-04-27 | Disposition: A | Discharge status: Home / Self Care | Payer: Managed Care, Other (non HMO) | Attending: Emergency Medicine | Admitting: Emergency Medicine

## 2015-04-27 ENCOUNTER — Encounter (HOSPITAL_COMMUNITY): Payer: Self-pay | Admitting: Emergency Medicine

## 2015-04-27 DIAGNOSIS — Z8619 Personal history of other infectious and parasitic diseases: Secondary | ICD-10-CM | POA: Diagnosis not present

## 2015-04-27 DIAGNOSIS — Y9289 Other specified places as the place of occurrence of the external cause: Secondary | ICD-10-CM | POA: Insufficient documentation

## 2015-04-27 DIAGNOSIS — G40909 Epilepsy, unspecified, not intractable, without status epilepticus: Secondary | ICD-10-CM | POA: Insufficient documentation

## 2015-04-27 DIAGNOSIS — E669 Obesity, unspecified: Secondary | ICD-10-CM | POA: Insufficient documentation

## 2015-04-27 DIAGNOSIS — G43909 Migraine, unspecified, not intractable, without status migrainosus: Secondary | ICD-10-CM | POA: Diagnosis not present

## 2015-04-27 DIAGNOSIS — Z86718 Personal history of other venous thrombosis and embolism: Secondary | ICD-10-CM | POA: Insufficient documentation

## 2015-04-27 DIAGNOSIS — K5900 Constipation, unspecified: Secondary | ICD-10-CM | POA: Insufficient documentation

## 2015-04-27 DIAGNOSIS — F4310 Post-traumatic stress disorder, unspecified: Secondary | ICD-10-CM | POA: Insufficient documentation

## 2015-04-27 DIAGNOSIS — E119 Type 2 diabetes mellitus without complications: Secondary | ICD-10-CM | POA: Insufficient documentation

## 2015-04-27 DIAGNOSIS — Y998 Other external cause status: Secondary | ICD-10-CM | POA: Insufficient documentation

## 2015-04-27 DIAGNOSIS — T7840XA Allergy, unspecified, initial encounter: Secondary | ICD-10-CM | POA: Insufficient documentation

## 2015-04-27 DIAGNOSIS — Z862 Personal history of diseases of the blood and blood-forming organs and certain disorders involving the immune mechanism: Secondary | ICD-10-CM | POA: Diagnosis not present

## 2015-04-27 DIAGNOSIS — X58XXXA Exposure to other specified factors, initial encounter: Secondary | ICD-10-CM | POA: Diagnosis not present

## 2015-04-27 DIAGNOSIS — F329 Major depressive disorder, single episode, unspecified: Secondary | ICD-10-CM | POA: Diagnosis not present

## 2015-04-27 DIAGNOSIS — Z7951 Long term (current) use of inhaled steroids: Secondary | ICD-10-CM | POA: Insufficient documentation

## 2015-04-27 DIAGNOSIS — J45901 Unspecified asthma with (acute) exacerbation: Secondary | ICD-10-CM | POA: Insufficient documentation

## 2015-04-27 DIAGNOSIS — R011 Cardiac murmur, unspecified: Secondary | ICD-10-CM | POA: Diagnosis not present

## 2015-04-27 DIAGNOSIS — Z791 Long term (current) use of non-steroidal anti-inflammatories (NSAID): Secondary | ICD-10-CM | POA: Insufficient documentation

## 2015-04-27 DIAGNOSIS — Z880 Allergy status to penicillin: Secondary | ICD-10-CM | POA: Insufficient documentation

## 2015-04-27 DIAGNOSIS — K219 Gastro-esophageal reflux disease without esophagitis: Secondary | ICD-10-CM | POA: Diagnosis not present

## 2015-04-27 DIAGNOSIS — G8929 Other chronic pain: Secondary | ICD-10-CM | POA: Diagnosis not present

## 2015-04-27 DIAGNOSIS — Y9389 Activity, other specified: Secondary | ICD-10-CM | POA: Diagnosis not present

## 2015-04-27 DIAGNOSIS — R220 Localized swelling, mass and lump, head: Secondary | ICD-10-CM | POA: Diagnosis present

## 2015-04-27 HISTORY — DX: Unspecified convulsions: R56.9

## 2015-04-27 MED ORDER — METHYLPREDNISOLONE SODIUM SUCC 125 MG IJ SOLR
125.0000 mg | Freq: Once | INTRAMUSCULAR | Status: AC
  Administered 2015-04-27: 125 mg via INTRAVENOUS
  Filled 2015-04-27: qty 2

## 2015-04-27 MED ORDER — DIPHENHYDRAMINE HCL 50 MG/ML IJ SOLN
25.0000 mg | Freq: Once | INTRAMUSCULAR | Status: AC
  Administered 2015-04-27: 25 mg via INTRAVENOUS
  Filled 2015-04-27: qty 1

## 2015-04-27 MED ORDER — SODIUM CHLORIDE 0.9 % IV BOLUS (SEPSIS)
500.0000 mL | Freq: Once | INTRAVENOUS | Status: AC
  Administered 2015-04-27: 500 mL via INTRAVENOUS

## 2015-04-27 MED ORDER — FAMOTIDINE IN NACL 20-0.9 MG/50ML-% IV SOLN
20.0000 mg | Freq: Once | INTRAVENOUS | Status: AC
Start: 2015-04-27 — End: 2015-04-27
  Administered 2015-04-27: 20 mg via INTRAVENOUS
  Filled 2015-04-27: qty 50

## 2015-04-27 NOTE — ED Provider Notes (Signed)
CSN: [id number]     Arrival date & time 04/27/15  1725 History  This chart was scribed for Nat Christen, MD by Rayfield Citizen, ED Scribe. This patient was seen in room APA10/APA10 and the patient's care was started at 8:51 PM.    Chief Complaint  Patient presents with  . Facial Swelling   The history is provided by the patient. No language interpreter was used.    HPI Comments: Jane English is a 44 y.o. female who presents to the Emergency Department complaining of 1 day of left-sided facial swelling with difficulty swallowing. Patient reports prior experience with similar symptoms which she attributed to an allergic reaction, though she is unsure of the potential allergen. No treatments or medications tried PTA.   Severeity is mild. [name] airway is not compromised.   Past Medical History  Diagnosis Date  . Migraine headache   . Back pain   . Obesity   . Hypertension   . DVT (deep venous thrombosis) 2010  . Heart murmur     no cardiologist  . Asthma   . Bronchitis   . Depression   . Diabetes mellitus without complication   . Obsessive-compulsive disorder   . PTSD (post-traumatic stress disorder)   . Shortness of breath   . GERD (gastroesophageal reflux disease)   . Anemia   . PSYCHOTIC D/O W/HALLUCINATIONS CONDS CLASS ELSW 03/04/2010    Qualifier: Diagnosis of  By: Moshe Cipro MD, Joycelyn Schmid    . Asthma flare 04/09/2013  . Seasonal allergies 12/10/2012  . Chronic abdominal pain   . Chronic constipation   . Diabetes mellitus, type II   . SBO (small bowel obstruction) 08/09/2013  . Helicobacter pylori gastritis 06/11/2013    Colonoscopy Dr. Hilarie Fredrickson  . Seizures    Past Surgical History  Procedure Laterality Date  . Appendectomy  1986  . Carpal tunnel release Bilateral   . Tubal ligation  1994  . Lumbar spine surgery  2010    x 3  . Lysis of adhesion  2003    Dr. Irving Shows  . Trigger finger release  2009    right pinkie finger  . Anterior cervical decomp/discectomy fusion   07/07/2012    Procedure: ANTERIOR CERVICAL DECOMPRESSION/DISCECTOMY FUSION 2 LEVELS;  Surgeon: Floyce Stakes, MD;  Location: [facility];  Service: Neurosurgery;  Laterality: N/A;  Cervical four-five, five - six  Anterior cervical decompression/diskectomy/fusion/plate  . Partial hysterectomy  1990s?    [place]  . Spinal cord stimulator implant    . Oophorectomy    . Laparoscopy N/A 07/29/2013    Procedure: diagnostic laporoscopy;  Surgeon: Adin Hector, MD;  Location: [facility];  Service: General;  Laterality: N/A;  . Lysis of adhesion N/A 07/29/2013    Procedure: LYSIS OF ADHESION;  Surgeon: Adin Hector, MD;  Location: [facility];  Service: General;  Laterality: N/A;  . Bowel resection N/A 07/29/2013    Procedure: serosal repair;  Surgeon: Adin Hector, MD;  Location: [facility];  Service: General;  Laterality: N/A;  . Laparoscopy N/A 08/16/2013    Procedure: LAPAROSCOPY DIAGNOSTIC/LYSIS OF ADHESIONS;  Surgeon: Adin Hector, MD;  Location: [facility];  Service: General;  Laterality: N/A;  . Laparotomy N/A 08/16/2013    Procedure: EXPLORATORY LAPAROTOMY/SMALL BOWEL RESECTION (JEJUNUM);  Surgeon: Adin Hector, MD;  Location: [facility];  Service: General;  Laterality: N/A;   Family History  Problem Relation Age of Onset  . Lung cancer Father   .  Stomach cancer Father   . Esophageal cancer Father   . Alcohol abuse Father   . Mental illness Father   . Diabetes Sister   . Hypertension Sister   . Bipolar disorder Sister   . Schizophrenia Sister   . Diabetes Sister   . Alcohol abuse Brother   . Hypertension Brother   . Kidney disease Brother   . Diabetes Brother   . Drug abuse Brother   . Mental illness Brother   . Alcohol abuse Brother   . Alcohol abuse Brother   . Hypertension Brother   . Diabetes Brother   . Alcohol abuse Brother   . Physical abuse Mother   . Alcohol abuse Mother   . Mental illness Brother     Deceased  . ADD / ADHD Neg Hx   . Anxiety disorder Neg Hx   .  Dementia Neg Hx   . Depression Neg Hx   . OCD Neg Hx   . Seizures Neg Hx   . Paranoid behavior Neg Hx   . Drug abuse Sister   . Alcohol abuse Brother   . Colon cancer Neg Hx    History  Substance Use Topics  . Smoking status: Never Smoker   . Smokeless tobacco: Never Used  . Alcohol Use: No   OB History    No data available     Review of Systems  A complete 10 system review of systems was obtained and all systems are negative except as noted in the HPI and PMH.    Allergies  Amoxicillin; Neurontin; Penicillins; Pregabalin; Tramadol; and Latex  Home Medications   Prior to Admission medications   Medication Sig Start Date End Date Taking? Authorizing Provider  acetaminophen (TYLENOL) 500 MG tablet Take 1,000 mg by mouth every 6 (six) hours as needed for mild pain.   Yes Historical Provider, MD  ALPRAZolam Duanne Moron) 1 MG tablet Take 1 mg by mouth 3 (three) times daily as needed. 04/10/15  Yes Historical Provider, MD  beclomethasone (QVAR) 40 MCG/ACT inhaler Inhale 2 puffs into the lungs 2 (two) times daily. 02/15/15  Yes Fayrene Helper, MD  Botulinum Toxin Type A 200 UNITS SOLR Inject 200 Units as directed every 3 (three) months.    Yes Historical Provider, MD  carbamazepine (TEGRETOL) 100 MG chewable tablet Take two in the am and three at night Patient taking differently: Chew 200-300 mg by mouth 2 (two) times daily. Take two in the am and three at night 03/03/15  Yes Cloria Spring, MD  diazepam (VALIUM) 5 MG tablet Take 1 tablet (5 mg total) by mouth every 8 (eight) hours as needed for muscle spasms. 01/10/15  Yes Fayrene Helper, MD  diclofenac sodium (VOLTAREN) 1 % GEL Apply 4 g topically 4 (four) times daily. 03/07/15  Yes Historical Provider, MD  escitalopram (LEXAPRO) 20 MG tablet Take 1 tablet (20 mg total) by mouth daily. 03/03/15 03/02/16 Yes Cloria Spring, MD  fluticasone (FLONASE) 50 MCG/ACT nasal spray PLACE 2 SPRAYS INTO BOTH NOSTRILS DAILY. 03/23/15  Yes Fayrene Helper, MD  ibuprofen (ADVIL,MOTRIN) 600 MG tablet Take 1 tablet (600 mg total) by mouth every 8 (eight) hours as needed. Patient taking differently: Take 600 mg by mouth every 8 (eight) hours as needed for moderate pain.  01/02/15  Yes Jola Schmidt, MD  ipratropium (ATROVENT) 0.02 % nebulizer solution NEBULIZE 1 VIAL EVERY 6 HOURS AS NEEDED FOR WHEEZING 03/23/15  Yes Fayrene Helper, MD  Linaclotide (  LINZESS) 145 MCG CAPS capsule Take 1 capsule (145 mcg total) by mouth daily. 04/04/15  Yes Jerene Bears, MD  lubiprostone (AMITIZA) 24 MCG capsule Take 1 capsule (24 mcg total) by mouth 2 (two) times daily. 04/04/15  Yes Jerene Bears, MD  medroxyPROGESTERone (PROVERA) 10 MG tablet Take 1 tablet (10 mg total) by mouth daily. Begin 1 may if no menstrual period til then 02/06/15  Yes Jonnie Kind, MD  montelukast (SINGULAIR) 10 MG tablet TAKE 1 TABLET (10 MG TOTAL) BY MOUTH AT BEDTIME. 03/28/15  Yes Fayrene Helper, MD  Oxycodone HCl 10 MG TABS Take 10 mg by mouth every 12 (twelve) hours as needed (pain).  03/29/15  Yes Historical Provider, MD  pantoprazole (PROTONIX) 40 MG tablet TAKE 1 TABLET BY MOUTH EVERY DAY 04/24/15  Yes Jerene Bears, MD  potassium chloride 20 MEQ/15ML (10%) SOLN TAKE 15MLS BY MOUTH TWICE A DAY 03/23/15  Yes Fayrene Helper, MD  triamterene-hydrochlorothiazide (DYAZIDE) 37.5-25 MG per capsule Take 1 capsule by mouth daily. 04/18/15  Yes Historical Provider, MD  vitamin B-12 (CYANOCOBALAMIN) 1000 MCG tablet Take 1 tablet (1,000 mcg total) by mouth daily. 04/07/15  Yes Jerene Bears, MD  Elastic Bandages & Supports (ABDOMINAL BINDER/ELASTIC MED) MISC 1 application by Does not apply route daily. 12/15/13   Jerene Bears, MD  pantoprazole (PROTONIX) 40 MG tablet TAKE 1 TABLET (40 MG TOTAL) BY MOUTH DAILY. Patient not taking: Reported on 04/27/2015 04/04/15   Jerene Bears, MD  triamterene-hydrochlorothiazide (MAXZIDE) 75-50 MG per tablet TAKE 1 TABLET BY MOUTH DAILY Patient not taking:  Reported on 04/27/2015 01/02/15   Fayrene Helper, MD   BP 121/76 mmHg  Pulse 66  Temp(Src) 98.1 F (36.7 C) (Oral)  Resp 18  Ht 5\' 6"  (1.676 m)  Wt 154 lb (69.854 kg)  BMI 24.87 kg/m2  SpO2 100%  LMP 04/16/2015 Physical Exam  Constitutional: She is oriented to person, place, and time. She appears well-developed and well-nourished.  No acute distress  HENT:  Head: Normocephalic and atraumatic.  Minimal left facial swelling; no obvious oropharyngeal mass or obstruction   Eyes: Conjunctivae and EOM are normal. Pupils are equal, round, and reactive to light.  Neck: Normal range of motion. Neck supple.  Cardiovascular: Normal rate and regular rhythm.   Pulmonary/Chest: Effort normal and breath sounds normal.  Abdominal: Soft. Bowel sounds are normal.  Musculoskeletal: Normal range of motion.  Neurological: She is alert and oriented to person, place, and time.  Skin: Skin is warm and dry.  Psychiatric: She has a normal mood and affect. Her behavior is normal.  Nursing note and vitals reviewed.   ED Course  Procedures   DIAGNOSTIC STUDIES: Oxygen Saturation is 100% on RA, normal by my interpretation.    COORDINATION OF CARE: 8:53 PM Discussed treatment plan with pt at bedside, including IV solu-medrol, benadryl, and pepcid for uncertain allergic phenomena. Patient agreed to plan.   Labs Review   Labs Reviewed - No data to display  Imaging Review No results found.   EKG Interpretation None      MDM   Final diagnoses:  Allergic reaction, initial encounter   She feels better after IV Solumedrol, IV Benadryl, IV Pepcid. She is stable and in no acute distress   I personally performed the services described in this documentation, which was scribed in my presence. The recorded information has been reviewed and is accurate.      Nat Christen, MD 04/27/15  2113 

## 2015-04-27 NOTE — Discharge Instructions (Signed)
Recommend Benadryl every 4-6 hours. Return if worse

## 2015-04-27 NOTE — ED Notes (Signed)
Pt c/o swelling to left side of face and lips with hives since yesterday. Pt c/o sore throat and burning to throat and mouth today.

## 2015-04-28 ENCOUNTER — Telehealth: Payer: Self-pay | Admitting: Family Medicine

## 2015-04-28 NOTE — Telephone Encounter (Signed)
Please sched Ed follow up visit for pt next week, she has no appt in system for regular follow up and needs one, thanks

## 2015-05-01 ENCOUNTER — Telehealth: Payer: Self-pay

## 2015-05-01 ENCOUNTER — Ambulatory Visit (INDEPENDENT_AMBULATORY_CARE_PROVIDER_SITE_OTHER): Payer: Medicare HMO | Admitting: Family Medicine

## 2015-05-01 ENCOUNTER — Encounter: Payer: Self-pay | Admitting: Family Medicine

## 2015-05-01 VITALS — BP 114/78 | HR 83 | Resp 16 | Ht 66.0 in | Wt 155.1 lb

## 2015-05-01 DIAGNOSIS — J45991 Cough variant asthma: Secondary | ICD-10-CM

## 2015-05-01 DIAGNOSIS — N3001 Acute cystitis with hematuria: Secondary | ICD-10-CM | POA: Insufficient documentation

## 2015-05-01 DIAGNOSIS — J302 Other seasonal allergic rhinitis: Secondary | ICD-10-CM

## 2015-05-01 DIAGNOSIS — Z79899 Other long term (current) drug therapy: Secondary | ICD-10-CM

## 2015-05-01 DIAGNOSIS — G43009 Migraine without aura, not intractable, without status migrainosus: Secondary | ICD-10-CM | POA: Diagnosis not present

## 2015-05-01 DIAGNOSIS — T7840XD Allergy, unspecified, subsequent encounter: Secondary | ICD-10-CM

## 2015-05-01 DIAGNOSIS — R7309 Other abnormal glucose: Secondary | ICD-10-CM

## 2015-05-01 DIAGNOSIS — I10 Essential (primary) hypertension: Secondary | ICD-10-CM

## 2015-05-01 DIAGNOSIS — R7303 Prediabetes: Secondary | ICD-10-CM

## 2015-05-01 DIAGNOSIS — G894 Chronic pain syndrome: Secondary | ICD-10-CM

## 2015-05-01 DIAGNOSIS — F4310 Post-traumatic stress disorder, unspecified: Secondary | ICD-10-CM

## 2015-05-01 DIAGNOSIS — T7840XA Allergy, unspecified, initial encounter: Secondary | ICD-10-CM | POA: Insufficient documentation

## 2015-05-01 DIAGNOSIS — N3000 Acute cystitis without hematuria: Secondary | ICD-10-CM

## 2015-05-01 LAB — POCT URINALYSIS DIPSTICK
Bilirubin, UA: NEGATIVE
Blood, UA: NEGATIVE
Glucose, UA: NEGATIVE
KETONES UA: NEGATIVE
LEUKOCYTES UA: NEGATIVE
NITRITE UA: NEGATIVE
Protein, UA: NEGATIVE
SPEC GRAV UA: 1.02
Urobilinogen, UA: 0.2
pH, UA: 7

## 2015-05-01 MED ORDER — AZELASTINE HCL 0.1 % NA SOLN: 2.0000 | Freq: Two times a day (BID) | NASAL | Status: DC

## 2015-05-01 NOTE — Telephone Encounter (Signed)
Called the office 06/23 stating her face was swelling and her lips and tongue were tingling. Advised urgent care or ED. Then came to the office and one side of her face did look bigger than the other and her tongue didn't look swollen but she said her lips and tongue felt funny/tingly so she went to be evaluated

## 2015-05-01 NOTE — Assessment & Plan Note (Signed)
Updated lab needed at/ before next visit. Jane English is reminded of the importance of commitment to daily physical activity for 30 minutes or more, as able and the need to limit carbohydrate intake to 30 to 60 grams per meal to help with blood sugar control.   The need to take medication as prescribed, test blood sugar as directed, and to call between visits if there is a concern that blood sugar is uncontrolled is also discussed.   Jane English is reminded of the importance of daily foot exam, annual eye examination, and good blood sugar, blood pressure and cholesterol control.  Diabetic Labs Latest Ref Rng 10/20/2014 10/17/2014 08/10/2014 06/17/2014 02/03/2014  HbA1c <5.7 % 6.0(H) - 6.2(H) - 5.7(H)  Microalbumin 0.00 - 1.89 mg/dL - - - - -  Micro/Creat Ratio 0.0 - 30.0 mg/g - - - - -  Chol 0 - 200 mg/dL - - 189 - -  HDL >39 mg/dL - - 75 - -  Calc LDL 0 - 99 mg/dL - - 101(H) - -  Triglycerides <150 mg/dL - - 67 - -  Creatinine 0.50 - 1.10 mg/dL 0.94 0.77 1.06 1.06 0.73  GFR >60.00 mL/min - - - - -   BP/Weight 05/01/2015 04/27/2015 04/04/2015 03/22/2015 02/06/2015 06/28/538 05/09/7340  Systolic BP 937 902 409 735 329 924 268  Diastolic BP 78 76 70 78 60 70 72  Wt. (Lbs) 155.12 154 161 165 169 163 160  BMI 25.05 24.87 26 26.64 27.29 26.32 25.84  Some encounter information is confidential and restricted. Go to Review Flowsheets activity to see all data.   No flowsheet data found.

## 2015-05-01 NOTE — Assessment & Plan Note (Signed)
Uncontrolled , excess nasal drainage add daily astellin

## 2015-05-01 NOTE — Patient Instructions (Signed)
Annual wellness in 10 weeks, call if you need me before  Please keep appt with psychology and also continue to speak with your Pastor  Fasting lipid,chem 7 and EGFr and TSH in 10 weeks

## 2015-05-01 NOTE — Assessment & Plan Note (Signed)
Managed by pain specialist 

## 2015-05-01 NOTE — Assessment & Plan Note (Signed)
Controlled, no change in medication DASH diet and commitment to daily physical activity for a minimum of 30 minutes discussed and encouraged, as a part of hypertension management. The importance of attaining a healthy weight is also discussed.  BP/Weight 05/01/2015 04/27/2015 04/04/2015 03/22/2015 02/06/2015 3/79/4446 11/12/120  Systolic BP 241 146 431 427 670 110 034  Diastolic BP 78 76 70 78 60 70 72  Wt. (Lbs) 155.12 154 161 165 169 163 160  BMI 25.05 24.87 26 26.64 27.29 26.32 25.84  Some encounter information is confidential and restricted. Go to Review Flowsheets activity to see all data.

## 2015-05-01 NOTE — Assessment & Plan Note (Signed)
Acute onset of urinary frequency, ua is negative, pt reassured
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
## 2015-05-01 NOTE — Assessment & Plan Note (Signed)
Markedly improved control on current regime, followed by neurology

## 2015-05-01 NOTE — Assessment & Plan Note (Signed)
Near Complete resolution of periorbital swelling, exact trigger unknown at this time, no new medication currently being  taken

## 2015-05-01 NOTE — Assessment & Plan Note (Signed)
Stable, no recurrent cough or wheeze, not currently affecting daily function, continue current management
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
## 2015-05-02 ENCOUNTER — Encounter: Payer: Medicare HMO | Admitting: Family Medicine

## 2015-05-02 ENCOUNTER — Encounter: Payer: Managed Care, Other (non HMO) | Admitting: Family Medicine

## 2015-05-07 ENCOUNTER — Other Ambulatory Visit: Payer: Self-pay | Admitting: Family Medicine

## 2015-05-09 ENCOUNTER — Ambulatory Visit (HOSPITAL_COMMUNITY): Payer: Self-pay | Admitting: Psychiatry

## 2015-05-17 ENCOUNTER — Telehealth: Payer: Self-pay

## 2015-05-17 MED ORDER — FLUCONAZOLE 150 MG PO TABS: 150.0000 mg | ORAL_TABLET | Freq: Once | ORAL | Status: DC

## 2015-05-17 MED ORDER — BENZONATATE 100 MG PO CAPS: 100.0000 mg | ORAL_CAPSULE | Freq: Two times a day (BID) | ORAL | Status: DC | PRN

## 2015-05-17 MED ORDER — AZITHROMYCIN 250 MG PO TABS
ORAL_TABLET | ORAL | Status: DC
Start: 2015-05-17 — End: 2015-05-22

## 2015-05-17 NOTE — Telephone Encounter (Signed)
pls send z pack x 1 , tessalon perles # 20  100 mg twice daily for 10 days, and if needed fluconazole 150 mg #1 , advise call for appt next week if not  Better pls

## 2015-05-17 NOTE — Telephone Encounter (Signed)
Coughing up yellow mucus and very hoarse x 3 days no fever that she can tell but some chills. Hurts in chest when she coughs. Has been taking robitussin but needs something else. Offered appt for today but she has another appt in [place] and doesn't how long it will take. Please advise

## 2015-05-17 NOTE — Telephone Encounter (Signed)
Patient aware and meds sent 

## 2015-05-17 NOTE — Addendum Note (Signed)
Addended by: Eual Fines on: 05/17/2015 01:38 PM   Modules accepted: Orders

## 2015-05-18 ENCOUNTER — Other Ambulatory Visit: Payer: Self-pay | Admitting: Family Medicine

## 2015-05-22 ENCOUNTER — Ambulatory Visit (INDEPENDENT_AMBULATORY_CARE_PROVIDER_SITE_OTHER): Payer: Medicare HMO | Admitting: Family Medicine

## 2015-05-22 VITALS — BP 122/60 | HR 76 | Temp 98.0°F | Resp 18 | Ht 66.0 in | Wt 152.0 lb

## 2015-05-22 DIAGNOSIS — J209 Acute bronchitis, unspecified: Secondary | ICD-10-CM | POA: Diagnosis not present

## 2015-05-22 DIAGNOSIS — J45991 Cough variant asthma: Secondary | ICD-10-CM

## 2015-05-22 DIAGNOSIS — J0100 Acute maxillary sinusitis, unspecified: Secondary | ICD-10-CM | POA: Diagnosis not present

## 2015-05-22 DIAGNOSIS — R058 Other specified cough: Secondary | ICD-10-CM

## 2015-05-22 DIAGNOSIS — R05 Cough: Secondary | ICD-10-CM | POA: Diagnosis not present

## 2015-05-22 DIAGNOSIS — I10 Essential (primary) hypertension: Secondary | ICD-10-CM

## 2015-05-22 MED ORDER — PROMETHAZINE-DM 6.25-15 MG/5ML PO SYRP: 5.0000 mL | ORAL_SOLUTION | Freq: Every evening | ORAL | Status: DC | PRN

## 2015-05-22 MED ORDER — PREDNISONE 5 MG (21) PO TBPK: ORAL_TABLET | ORAL | Status: DC

## 2015-05-22 MED ORDER — DOXYCYCLINE HYCLATE 100 MG PO TABS: 100.0000 mg | ORAL_TABLET | Freq: Two times a day (BID) | ORAL | Status: DC

## 2015-05-22 NOTE — Patient Instructions (Signed)
F/u as before  You are treated for acute sinusitis and bronchitis , also asthma flare   doxycycline  , an antibiotic is prescribed for 10 days, prednisone dose pack, cough suppressant sent in  cXR today, CBc and chem 7 today, also sputum c/s  Work excuse to return in 1 week

## 2015-05-23 ENCOUNTER — Ambulatory Visit (HOSPITAL_COMMUNITY): Payer: Self-pay | Admitting: Psychiatry

## 2015-05-23 ENCOUNTER — Ambulatory Visit (HOSPITAL_COMMUNITY)
Admission: RE | Admit: 2015-05-23 | Discharge: 2015-05-23 | Disposition: A | Discharge status: Home / Self Care | Payer: Managed Care, Other (non HMO) | Source: Ambulatory Visit | Attending: Family Medicine | Admitting: Family Medicine

## 2015-05-23 DIAGNOSIS — R058 Other specified cough: Secondary | ICD-10-CM

## 2015-05-23 DIAGNOSIS — R05 Cough: Secondary | ICD-10-CM | POA: Insufficient documentation

## 2015-05-23 LAB — CBC
HEMATOCRIT: 37.2 % (ref 36.0–46.0)
HEMOGLOBIN: 12.3 g/dL (ref 12.0–15.0)
MCH: 30.2 pg (ref 26.0–34.0)
MCHC: 33.1 g/dL (ref 30.0–36.0)
MCV: 91.4 fL (ref 78.0–100.0)
MPV: 10.9 fL (ref 8.6–12.4)
Platelets: 339 10*3/uL (ref 150–400)
RBC: 4.07 MIL/uL (ref 3.87–5.11)
RDW: 13.8 % (ref 11.5–15.5)
WBC: 5.8 10*3/uL (ref 4.0–10.5)

## 2015-05-23 LAB — BASIC METABOLIC PANEL
BUN: 16 mg/dL (ref 6–23)
CHLORIDE: 101 meq/L (ref 96–112)
CO2: 29 meq/L (ref 19–32)
Calcium: 9.3 mg/dL (ref 8.4–10.5)
Creat: 0.88 mg/dL (ref 0.50–1.10)
GLUCOSE: 77 mg/dL (ref 70–99)
POTASSIUM: 4.2 meq/L (ref 3.5–5.3)
SODIUM: 140 meq/L (ref 135–145)

## 2015-05-26 LAB — RESPIRATORY CULTURE OR RESPIRATORY AND SPUTUM CULTURE: ORGANISM ID, BACTERIA: NORMAL

## 2015-05-28 ENCOUNTER — Encounter: Payer: Self-pay | Admitting: Family Medicine

## 2015-05-28 ENCOUNTER — Other Ambulatory Visit: Payer: Self-pay | Admitting: Internal Medicine

## 2015-05-28 ENCOUNTER — Other Ambulatory Visit: Payer: Self-pay | Admitting: Family Medicine

## 2015-05-28 DIAGNOSIS — J209 Acute bronchitis, unspecified: Secondary | ICD-10-CM | POA: Insufficient documentation

## 2015-05-28 DIAGNOSIS — J329 Chronic sinusitis, unspecified: Secondary | ICD-10-CM | POA: Insufficient documentation

## 2015-05-28 NOTE — Assessment & Plan Note (Signed)
Controlled, no change in medication DASH diet and commitment to daily physical activity for a minimum of 30 minutes discussed and encouraged, as a part of hypertension management. The importance of attaining a healthy weight is also discussed.  BP/Weight 05/22/2015 05/01/2015 04/27/2015 04/04/2015 03/22/2015 02/06/2015 2/81/1886  Systolic BP 773 736 681 594 707 615 183  Diastolic BP 60 78 76 70 78 60 70  Wt. (Lbs) 152 155.12 154 161 165 169 163  BMI 24.55 25.05 24.87 26 26.64 27.29 26.32  Some encounter information is confidential and restricted. Go to Review Flowsheets activity to see all data.

## 2015-05-28 NOTE — Assessment & Plan Note (Signed)
10 day antibiotic course prescribed 

## 2015-05-28 NOTE — Assessment & Plan Note (Signed)
Current flare, steroid dose pack prescribed 

## 2015-05-28 NOTE — Assessment & Plan Note (Signed)
Antibiotic, prednisone and cough suppressant prescribed and 1 week work excuse written Data as far as sputum, CXR and labs are ordered

## 2015-05-28 NOTE — Progress Notes (Signed)
1 week h/o worsening head and chest congestion, associated with fever and chills intermittently. Nasal drainage has thickened , and is yellowish green, and at times bloody. Sputum is thick and yellow. C/o bilateral ear pressure, denies hearing loss and sore throat. Increasing fatigue , poor appetitie and sleep disturbed by cough. No improvement with OTC medication.  ROS: See above   PE: Patient alert and oriented and in no cardiopulmonary distress. Ill appearing HEENT: No facial asymmetry, EOMI,   oropharynx pink and moist.  Neck bilateral anterior cervical adenitis, maxillary sinus tenderness, TM clear  Chest: Adequate air entry , scattered crackles, few wheezes CVS: S1, S2 no murmurs, no S3.Regular rate.  ABD: Soft non tender.   Ext: No edema  MS: Adequate ROM spine, shoulders, hips and knees.  Skin: Intact, no ulcerations or rash noted.  Psych: Good eye contact, normal affect. Memory intact not anxious or depressed appearing.  CNS: CN 2-12 intact, power,  normal throughout.no focal deficits noted.  A/P  Acute bronchitis Antibiotic, prednisone and cough suppressant prescribed and 1 week work excuse written Data as far as sputum, CXR and labs are ordered  Acute maxillary sinusitis 10 day antibiotic course prescribed  Cough variant asthma Current flare, steroid dose pack prescribed  Essential hypertension Controlled, no change in medication DASH diet and commitment to daily physical activity for a minimum of 30 minutes discussed and encouraged, as a part of hypertension management. The importance of attaining a healthy weight is also discussed.  BP/Weight 05/22/2015 05/01/2015 04/27/2015 04/04/2015 03/22/2015 02/06/2015 6/46/8032  Systolic BP 122 482 500 370 488 891 694  Diastolic BP 60 78 76 70 78 60 70  Wt. (Lbs) 152 155.12 154 161 165 169 163  BMI 24.55 25.05 24.87 26 26.64 27.29 26.32  Some encounter information is confidential and restricted. Go to Review Flowsheets  activity to see all data.

## 2015-05-29 ENCOUNTER — Other Ambulatory Visit: Payer: Self-pay | Admitting: Internal Medicine

## 2015-05-29 ENCOUNTER — Telehealth (HOSPITAL_COMMUNITY): Payer: Self-pay | Admitting: *Deleted

## 2015-05-29 NOTE — Telephone Encounter (Signed)
lmtcb to resch appt for 06-09-15. number provided

## 2015-05-30 ENCOUNTER — Other Ambulatory Visit: Payer: Self-pay | Admitting: Family Medicine

## 2015-06-02 ENCOUNTER — Ambulatory Visit (HOSPITAL_COMMUNITY): Payer: Self-pay | Admitting: Psychiatry

## 2015-06-06 ENCOUNTER — Ambulatory Visit (HOSPITAL_COMMUNITY): Payer: Self-pay | Admitting: Psychiatry

## 2015-06-07 ENCOUNTER — Encounter: Payer: Medicare HMO | Admitting: Family Medicine

## 2015-06-09 ENCOUNTER — Encounter (HOSPITAL_COMMUNITY): Payer: Self-pay | Admitting: Psychiatry

## 2015-06-09 ENCOUNTER — Ambulatory Visit (INDEPENDENT_AMBULATORY_CARE_PROVIDER_SITE_OTHER): Payer: Managed Care, Other (non HMO) | Admitting: Psychiatry

## 2015-06-09 VITALS — BP 104/65 | HR 80 | Ht 66.0 in | Wt 151.2 lb

## 2015-06-09 DIAGNOSIS — F319 Bipolar disorder, unspecified: Secondary | ICD-10-CM

## 2015-06-09 DIAGNOSIS — F4310 Post-traumatic stress disorder, unspecified: Secondary | ICD-10-CM

## 2015-06-09 DIAGNOSIS — F3130 Bipolar disorder, current episode depressed, mild or moderate severity, unspecified: Secondary | ICD-10-CM

## 2015-06-09 MED ORDER — ESCITALOPRAM OXALATE 20 MG PO TABS: 20.0000 mg | ORAL_TABLET | Freq: Every day | ORAL | Status: DC

## 2015-06-09 MED ORDER — CARBAMAZEPINE 100 MG PO CHEW: CHEWABLE_TABLET | ORAL | Status: DC

## 2015-06-09 MED ORDER — ALPRAZOLAM 1 MG PO TABS: 1.0000 mg | ORAL_TABLET | Freq: Three times a day (TID) | ORAL | Status: DC | PRN
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
## 2015-06-12 ENCOUNTER — Other Ambulatory Visit: Payer: Self-pay | Admitting: Family Medicine

## 2015-06-12 ENCOUNTER — Telehealth: Payer: Self-pay | Admitting: Internal Medicine

## 2015-06-12 NOTE — Telephone Encounter (Signed)
Dr Hilarie Fredrickson- Please advise.... May I increase dosage to twice daily per patient request?

## 2015-06-13 MED ORDER — PANTOPRAZOLE SODIUM 40 MG PO TBEC: DELAYED_RELEASE_TABLET | ORAL | Status: DC

## 2015-06-13 NOTE — Telephone Encounter (Signed)
Rx sent 

## 2015-06-13 NOTE — Telephone Encounter (Signed)
Ok for BID PPI

## 2015-06-15 ENCOUNTER — Encounter: Payer: Managed Care, Other (non HMO) | Admitting: Family Medicine

## 2015-06-15 ENCOUNTER — Ambulatory Visit: Payer: Self-pay | Admitting: Family Medicine

## 2015-06-16 ENCOUNTER — Other Ambulatory Visit (HOSPITAL_COMMUNITY): Payer: Self-pay | Admitting: Psychiatry

## 2015-06-20 ENCOUNTER — Ambulatory Visit: Payer: Self-pay | Admitting: Family Medicine

## 2015-06-22 ENCOUNTER — Telehealth (HOSPITAL_COMMUNITY): Payer: Self-pay | Admitting: *Deleted

## 2015-06-22 NOTE — Telephone Encounter (Signed)
Pt called 06-21-15 6:19pm and lm to on answering machine stating she would like to cancel her appt for 06-23-15. Called pt and lm to call office back. Appt will be called.

## 2015-06-23 ENCOUNTER — Ambulatory Visit (HOSPITAL_COMMUNITY): Payer: Self-pay | Admitting: Psychiatry

## 2015-06-26 ENCOUNTER — Ambulatory Visit: Payer: Self-pay | Admitting: Family Medicine

## 2015-06-29 ENCOUNTER — Other Ambulatory Visit: Payer: Self-pay

## 2015-06-29 DIAGNOSIS — R7303 Prediabetes: Secondary | ICD-10-CM

## 2015-06-29 DIAGNOSIS — Z1159 Encounter for screening for other viral diseases: Secondary | ICD-10-CM

## 2015-06-30 ENCOUNTER — Telehealth: Payer: Self-pay | Admitting: Neurology

## 2015-06-30 NOTE — Telephone Encounter (Signed)
Called patient to set up apt for next botox injection. Left a VM asking her to return my call.

## 2015-07-04 NOTE — Telephone Encounter (Signed)
Spoke with the patient to set up her botox apt. She stated that she is out of town right now and will call back at a later time to schedule apt.

## 2015-07-07 ENCOUNTER — Other Ambulatory Visit: Payer: Self-pay | Admitting: Family Medicine

## 2015-07-11 ENCOUNTER — Telehealth: Payer: Self-pay

## 2015-07-11 NOTE — Telephone Encounter (Signed)
Pt knows to come for labs, orders in epic. 

## 2015-07-11 NOTE — Telephone Encounter (Signed)
-----   Message from Greggory Keen, LPN sent at 01/05/5824  9:30 AM EDT ----- Reminder call for cbc and iron studies. Order is in the system.

## 2015-07-26 ENCOUNTER — Other Ambulatory Visit: Payer: Self-pay

## 2015-07-26 ENCOUNTER — Telehealth: Payer: Self-pay

## 2015-07-26 ENCOUNTER — Ambulatory Visit (INDEPENDENT_AMBULATORY_CARE_PROVIDER_SITE_OTHER): Payer: Managed Care, Other (non HMO)

## 2015-07-26 DIAGNOSIS — I10 Essential (primary) hypertension: Secondary | ICD-10-CM

## 2015-07-26 DIAGNOSIS — G894 Chronic pain syndrome: Secondary | ICD-10-CM

## 2015-07-26 DIAGNOSIS — R7303 Prediabetes: Secondary | ICD-10-CM

## 2015-07-26 MED ORDER — METHYLPREDNISOLONE ACETATE 80 MG/ML IJ SUSP
80.0000 mg | Freq: Once | INTRAMUSCULAR | Status: AC
  Administered 2015-07-26: 80 mg via INTRAMUSCULAR

## 2015-07-26 MED ORDER — PREDNISONE 5 MG PO TABS: 5.0000 mg | ORAL_TABLET | Freq: Two times a day (BID) | ORAL | Status: DC

## 2015-07-26 MED ORDER — KETOROLAC TROMETHAMINE 60 MG/2ML IM SOLN
60.0000 mg | Freq: Once | INTRAMUSCULAR | Status: AC
  Administered 2015-07-26: 60 mg via INTRAMUSCULAR

## 2015-07-26 NOTE — Progress Notes (Signed)
Ok for her to get toradol 60 and depo 80 IM per Dr. Abbott Pao states she has been aching all over her body. No injury. Thinks its from neuropathy. Send in prednisone 5 mg 1 tab bid for 5 days. Pt aware. Injections given with no complications. Advised to call back if she needed anything

## 2015-07-26 NOTE — Telephone Encounter (Signed)
Pt aware.

## 2015-07-26 NOTE — Telephone Encounter (Signed)
OK toradol 60 mg IM and depo medrol 80 mg IM, also send pred 5 mg one twice daily for 5 days pls

## 2015-07-26 NOTE — Telephone Encounter (Signed)
States since Sunday she has been aching all over especially in her lower back, left shoulder and right wrist, even down into her legs. States " I ache all over" She can hardly raise up her left arm. She has tried soaking in epsom salt and nothing is relieving the pain. She doesn't come in for her OV until Tuesday. Wants to know if she can come in and get pain shots today. Please advise

## 2015-08-01 ENCOUNTER — Ambulatory Visit (INDEPENDENT_AMBULATORY_CARE_PROVIDER_SITE_OTHER): Payer: Managed Care, Other (non HMO) | Admitting: Family Medicine

## 2015-08-01 ENCOUNTER — Other Ambulatory Visit (HOSPITAL_COMMUNITY)
Admission: RE | Admit: 2015-08-01 | Discharge: 2015-08-01 | Disposition: A | Discharge status: Home / Self Care | Payer: Managed Care, Other (non HMO) | Source: Ambulatory Visit | Attending: Family Medicine | Admitting: Family Medicine

## 2015-08-01 ENCOUNTER — Encounter: Payer: Self-pay | Admitting: Family Medicine

## 2015-08-01 VITALS — BP 128/68 | HR 70 | Resp 18 | Ht 66.0 in | Wt 157.0 lb

## 2015-08-01 DIAGNOSIS — Z113 Encounter for screening for infections with a predominantly sexual mode of transmission: Secondary | ICD-10-CM | POA: Insufficient documentation

## 2015-08-01 DIAGNOSIS — N760 Acute vaginitis: Secondary | ICD-10-CM | POA: Diagnosis present

## 2015-08-01 DIAGNOSIS — Z23 Encounter for immunization: Secondary | ICD-10-CM | POA: Diagnosis not present

## 2015-08-01 DIAGNOSIS — Z124 Encounter for screening for malignant neoplasm of cervix: Secondary | ICD-10-CM

## 2015-08-01 DIAGNOSIS — Z1151 Encounter for screening for human papillomavirus (HPV): Secondary | ICD-10-CM | POA: Diagnosis present

## 2015-08-01 DIAGNOSIS — Z01411 Encounter for gynecological examination (general) (routine) with abnormal findings: Secondary | ICD-10-CM | POA: Diagnosis present

## 2015-08-01 DIAGNOSIS — Z0000 Encounter for general adult medical examination without abnormal findings: Secondary | ICD-10-CM | POA: Diagnosis not present

## 2015-08-01 DIAGNOSIS — Z1211 Encounter for screening for malignant neoplasm of colon: Secondary | ICD-10-CM | POA: Diagnosis not present

## 2015-08-01 DIAGNOSIS — M542 Cervicalgia: Secondary | ICD-10-CM | POA: Insufficient documentation

## 2015-08-01 DIAGNOSIS — N898 Other specified noninflammatory disorders of vagina: Secondary | ICD-10-CM

## 2015-08-01 LAB — COMPLETE METABOLIC PANEL WITH GFR
ALBUMIN: 4.5 g/dL (ref 3.6–5.1)
ALK PHOS: 50 U/L (ref 33–115)
ALT: 24 U/L (ref 6–29)
AST: 21 U/L (ref 10–35)
BUN: 14 mg/dL (ref 7–25)
CALCIUM: 9.5 mg/dL (ref 8.6–10.2)
CO2: 27 mmol/L (ref 20–31)
CREATININE: 0.87 mg/dL (ref 0.50–1.10)
Chloride: 99 mmol/L (ref 98–110)
GFR, Est Non African American: 80 mL/min (ref >=60)
Glucose, Bld: 76 mg/dL (ref 65–99)
Potassium: 3.7 mmol/L (ref 3.5–5.3)
Sodium: 139 mmol/L (ref 135–146)
Total Bilirubin: 1 mg/dL (ref 0.2–1.2)
Total Protein: 7.5 g/dL (ref 6.1–8.1)

## 2015-08-01 LAB — TSH: TSH: 1.175 u[IU]/mL (ref 0.350–4.500)

## 2015-08-01 LAB — POC HEMOCCULT BLD/STL (OFFICE/1-CARD/DIAGNOSTIC): Fecal Occult Blood, POC: NEGATIVE

## 2015-08-01 LAB — LIPID PANEL
CHOL/HDL RATIO: 2.7 ratio (ref <=5.0)
CHOLESTEROL: 188 mg/dL (ref 125–200)
HDL: 69 mg/dL (ref >=46)
LDL Cholesterol: 105 mg/dL (ref <130)
TRIGLYCERIDES: 72 mg/dL (ref <150)
VLDL: 14 mg/dL (ref <30)

## 2015-08-01 LAB — HEMOGLOBIN A1C
Hgb A1c MFr Bld: 6.1 % — ABNORMAL HIGH (ref <5.7)
Mean Plasma Glucose: 128 mg/dL — ABNORMAL HIGH (ref <117)

## 2015-08-01 LAB — HIV ANTIBODY (ROUTINE TESTING W REFLEX): HIV 1&2 Ab, 4th Generation: NONREACTIVE

## 2015-08-01 NOTE — Patient Instructions (Addendum)
F/U in January, call if you need me before  Pap today and complete exam  You are referred for a  mammogram , due 09/02/2015  Blood sugar is a little higher than normal, but you are NOT diabetic, watch sweets and starchy foods    I  Am referring you to neurosurgeon  Flu vaccine today

## 2015-08-01 NOTE — Progress Notes (Signed)
   Subjective:    Patient ID: Jane English, female    DOB: 1971-09-30, 44 y.o.   MRN: [id number]  HPI Patient is in for annual physical exam. C/o neck and left upper extremity pain rated at a 10, present for approximately 4 weeks. No aggravating trauma. Has had C spine surgery in the past Recent labs, if available are reviewed. Immunization is reviewed , and  updated     Review of Systems See HPI     Objective:   Physical Exam BP 128/68 mmHg  Pulse 70  Resp 18  Ht 5\' 6"  (1.676 m)  Wt 157 lb (71.215 kg)  BMI 25.35 kg/m2  SpO2 99%  Pleasant well nourished female, alert and oriented x 3, in no cardio-pulmonary distress. Afebrile. HEENT No facial trauma or asymetry. Sinuses non tender.  Extra occullar muscles intact. External ears normal, tympanic membranes clear. Oropharynx moist, no exudate, good dentition. Neck: mildly decreased , though adequate ROM, , no adenopathy,JVD or thyromegaly.No bruits.  Chest: Clear to ascultation bilaterally.No crackles or wheezes. Non tender to palpation  Breast: No asymetry,no masses or lumps. No tenderness. No nipple discharge or inversion. No axillary or supraclavicular adenopathy  Cardiovascular system; Heart sounds normal,  S1 and  S2 ,no S3.  No murmur, or thrill. Apical beat not displaced Peripheral pulses normal.  Abdomen: Soft, generalized diffuse superficial  tenderness, no organomegaly or masses. No bruits. Bowel sounds normal. No guarding, tenderness or rebound.  Rectal:  Normal sphincter tone. No mass.No rectal masses.  Guaiac negative stool.  GU: External genitalia normal female genitalia , female distribution of hair. No lesions. Urethral meatus normal in size, no  Prolapse, no lesions visibly  Present. Bladder non tender. Vagina pink and moist , with no visible lesions , physiologic appearing  discharge present . Adequate pelvic support no  cystocele or rectocele noted Cervix pink and appears healthy, no  lesions or ulcerations noted, no discharge noted from os Uterus enlarged , approx 12 week size, no adnexal masses, no cervical motion or adnexal tenderness.   Musculoskeletal exam: Full ROM of spine, hips , shoulders and knees. No deformity ,swelling or crepitus noted. No muscle wasting or atrophy.   Neurologic: Cranial nerves 2 to 12 intact. Power, tone ,sensation and reflexes normal throughout. No disturbance in gait. No tremor.  Skin: Intact, no ulceration, erythema , scaling or rash noted. Pigmentation normal throughout  Psych; Normal mood and affect. Judgement and concentration normal        Assessment & Plan:  Annual physical exam Annual exam as documented. Counseling done  re healthy lifestyle involving commitment to 150 minutes exercise per week, heart healthy diet, and attaining healthy weight.The importance of adequate sleep also discussed. Regular seat belt use and home safety, is also discussed. Changes in health habits are decided on by the patient with goals and time frames  set for achieving them. Immunization and cancer screening needs are specifically addressed at this visit.   Neck pain on left side Increased and uncontrolled left sided neck pain radiating down arm. Has had C spine surgery in the past  Will refer to her neurosurgeon for re evaluationm  Vaginal discharge Specimens sent for testing , will treat based on report

## 2015-08-04 ENCOUNTER — Ambulatory Visit (HOSPITAL_COMMUNITY): Payer: Managed Care, Other (non HMO) | Admitting: Psychiatry

## 2015-08-04 DIAGNOSIS — N898 Other specified noninflammatory disorders of vagina: Secondary | ICD-10-CM | POA: Insufficient documentation

## 2015-08-04 DIAGNOSIS — Z0000 Encounter for general adult medical examination without abnormal findings: Secondary | ICD-10-CM | POA: Insufficient documentation

## 2015-08-04 LAB — CYTOLOGY - PAP

## 2015-08-04 NOTE — Assessment & Plan Note (Signed)
Specimens sent for testing , will treat based on report

## 2015-08-04 NOTE — Assessment & Plan Note (Signed)
Increased and uncontrolled left sided neck pain radiating down arm. Has had C spine surgery in the past  Will refer to her neurosurgeon for re evaluationm

## 2015-08-04 NOTE — Assessment & Plan Note (Signed)

## 2015-08-05 ENCOUNTER — Other Ambulatory Visit: Payer: Self-pay | Admitting: Family Medicine

## 2015-08-06 LAB — CERVICOVAGINAL ANCILLARY ONLY
BACTERIAL VAGINITIS: POSITIVE — AB
Candida vaginitis: NEGATIVE

## 2015-08-07 ENCOUNTER — Other Ambulatory Visit: Payer: Self-pay

## 2015-08-07 ENCOUNTER — Ambulatory Visit (HOSPITAL_COMMUNITY): Payer: Managed Care, Other (non HMO) | Admitting: Psychiatry

## 2015-08-07 MED ORDER — METRONIDAZOLE 500 MG PO TABS: 500.0000 mg | ORAL_TABLET | Freq: Two times a day (BID) | ORAL | Status: DC

## 2015-08-07 MED ORDER — FLUCONAZOLE 150 MG PO TABS: 150.0000 mg | ORAL_TABLET | Freq: Once | ORAL | Status: DC

## 2015-08-08 ENCOUNTER — Encounter: Payer: Self-pay | Admitting: Family Medicine

## 2015-08-08 DIAGNOSIS — R87810 Cervical high risk human papillomavirus (HPV) DNA test positive: Secondary | ICD-10-CM | POA: Insufficient documentation

## 2015-08-14 ENCOUNTER — Telehealth: Payer: Self-pay | Admitting: Internal Medicine

## 2015-08-14 ENCOUNTER — Telehealth: Payer: Self-pay | Admitting: Neurology

## 2015-08-14 MED ORDER — LINACLOTIDE 290 MCG PO CAPS: 290.0000 ug | ORAL_CAPSULE | Freq: Two times a day (BID) | ORAL | Status: DC

## 2015-08-14 NOTE — Telephone Encounter (Signed)
Would try amitiza 24 mg BID and linzess 290 mcg daily Call if no response

## 2015-08-14 NOTE — Telephone Encounter (Signed)
Spoke with pt and she is aware. New script for linzess sent to pharmacy.

## 2015-08-14 NOTE — Telephone Encounter (Signed)
Patient returned phone call regarding scheduling Botox

## 2015-08-14 NOTE — Telephone Encounter (Signed)
Pt states that she has been taking amitiza 70mcg tid and at night she is taking linzess 171mcg. Pt states she thinks the dosage needs to be increased because she is not going to the bathroom like she was, states she can feel that she is "backed up." Please advise.

## 2015-08-15 ENCOUNTER — Telehealth: Payer: Self-pay | Admitting: *Deleted

## 2015-08-15 NOTE — Telephone Encounter (Signed)
I called LMOM with Levada Dy the Referral Coordinator with [facility] to check status of patients referral. I mae patient aware

## 2015-08-16 ENCOUNTER — Ambulatory Visit (INDEPENDENT_AMBULATORY_CARE_PROVIDER_SITE_OTHER): Payer: Managed Care, Other (non HMO) | Admitting: Psychiatry

## 2015-08-16 ENCOUNTER — Encounter (HOSPITAL_COMMUNITY): Payer: Self-pay | Admitting: Psychiatry

## 2015-08-16 DIAGNOSIS — F3130 Bipolar disorder, current episode depressed, mild or moderate severity, unspecified: Secondary | ICD-10-CM
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
## 2015-08-16 NOTE — Patient Instructions (Signed)
Discussed orally 

## 2015-08-16 NOTE — Telephone Encounter (Signed)
I spoke with Gerald Stabs from [facility] she gave me a new fax number to fax the referral, Gerald Stabs stated they did not have the referral I faxed on 08/07/15

## 2015-08-24 NOTE — Telephone Encounter (Signed)
Please refer to previous note.   Called patient back and schedule botox apt.

## 2015-08-24 NOTE — Telephone Encounter (Signed)
Patient called back regarding scheduling Botox. Patient states she called approximately 2 weeks ago and states no one has gotten in touch with her yet. Please call [phone].

## 2015-08-29 ENCOUNTER — Other Ambulatory Visit: Payer: Self-pay | Admitting: Family Medicine

## 2015-08-31 ENCOUNTER — Telehealth: Payer: Self-pay | Admitting: *Deleted

## 2015-08-31 ENCOUNTER — Other Ambulatory Visit: Payer: Self-pay | Admitting: Family Medicine

## 2015-08-31 DIAGNOSIS — Z1231 Encounter for screening mammogram for malignant neoplasm of breast: Secondary | ICD-10-CM

## 2015-08-31 NOTE — Telephone Encounter (Signed)
Referral was sent to [facility] 08/04/15 for Dr. Joya Salm  I called and spoke with Gerald Stabs who is their referral coordinator and she stated they would call Jane English to schedule a appt.   I spoke with [place] today and she has not been scheduled and would like to go somewhere else   I called [facility] and spoke with Jacqlyn Larsen who is Dr. Harley Hallmark secretary and she stated they spoke with [facility] yesterday or the day before and made her aware that Dr. Joya Salm is out of the office until 09/11/15 and her referral is in his office for review.   Where would you like for patient to go?

## 2015-08-31 NOTE — Telephone Encounter (Signed)
Patient called requesting a referral somewhere else patient said she has not heard from Dr. Fenton Malling office

## 2015-09-01 ENCOUNTER — Telehealth: Payer: Self-pay | Admitting: Family Medicine

## 2015-09-01 ENCOUNTER — Ambulatory Visit (HOSPITAL_COMMUNITY): Payer: Self-pay | Admitting: Psychiatry

## 2015-09-01 DIAGNOSIS — M5090 Cervical disc disorder, unspecified, unspecified cervical region: Secondary | ICD-10-CM

## 2015-09-01 DIAGNOSIS — M542 Cervicalgia: Secondary | ICD-10-CM

## 2015-09-01 NOTE — Telephone Encounter (Signed)
D with pt , In response to neurosurgery  referral, since still awaiting appt, and the fact that she now requests new neurosurgeon, I recommend specifically asking pt if she wishes to see a specific Provider, if so refer to that Provider. If not , she  currently sees pain Doc in [facility], so if there is neurosurgeon in  [place] who will accept her , first choice, if not I recommend [facility] neurosurgeon. Pls review the options I have presented with Micaiah to get her input , before calling for appts, so that we get her input, thanks  ??? pls ask Pls send me a tele response on updated info on this referral Thanks

## 2015-09-01 NOTE — Telephone Encounter (Signed)
Spoke with patient.  She states that she is in more pain in right wrist and left shoulder coming from established cervical problems.  She does not have a preference on who she sees.  She would just like to be seen as soon as possible.   Will try to find neurosurgeon in [facility] if not will try [facility].  Will also see if office manager can speak with someone at Dr. Harley Hallmark office and see is she can be seen there by another physician until Dr. Joya Salm returns.

## 2015-09-01 NOTE — Addendum Note (Signed)
Addended by: Denman George B on: 09/01/2015 05:10 PM   Modules accepted: Orders

## 2015-09-01 NOTE — Telephone Encounter (Signed)
Referral entered for [facility] Dr. Lavell Anchors in [facility].

## 2015-09-04 NOTE — Telephone Encounter (Signed)
See next tele message

## 2015-09-06 ENCOUNTER — Ambulatory Visit (HOSPITAL_COMMUNITY)
Admission: RE | Admit: 2015-09-06 | Discharge: 2015-09-06 | Disposition: A | Discharge status: Home / Self Care | Payer: Managed Care, Other (non HMO) | Source: Ambulatory Visit | Attending: Family Medicine | Admitting: Family Medicine

## 2015-09-06 DIAGNOSIS — Z1231 Encounter for screening mammogram for malignant neoplasm of breast: Secondary | ICD-10-CM | POA: Insufficient documentation

## 2015-09-08 ENCOUNTER — Ambulatory Visit (INDEPENDENT_AMBULATORY_CARE_PROVIDER_SITE_OTHER): Payer: Managed Care, Other (non HMO) | Admitting: Psychiatry

## 2015-09-08 ENCOUNTER — Encounter (HOSPITAL_COMMUNITY): Payer: Self-pay | Admitting: Psychiatry

## 2015-09-08 VITALS — BP 98/64 | HR 77 | Ht 66.0 in | Wt 158.0 lb

## 2015-09-08 DIAGNOSIS — F3175 Bipolar disorder, in partial remission, most recent episode depressed: Secondary | ICD-10-CM

## 2015-09-08 DIAGNOSIS — F4310 Post-traumatic stress disorder, unspecified: Secondary | ICD-10-CM

## 2015-09-08 MED ORDER — ALPRAZOLAM 1 MG PO TABS: 1.0000 mg | ORAL_TABLET | Freq: Three times a day (TID) | ORAL | Status: DC | PRN

## 2015-09-08 MED ORDER — CARBAMAZEPINE 100 MG PO CHEW: CHEWABLE_TABLET | ORAL | Status: DC

## 2015-09-08 MED ORDER — ESCITALOPRAM OXALATE 20 MG PO TABS: 20.0000 mg | ORAL_TABLET | Freq: Every day | ORAL | Status: DC
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
## 2015-09-13 ENCOUNTER — Other Ambulatory Visit: Payer: Self-pay | Admitting: Family Medicine

## 2015-09-13 ENCOUNTER — Telehealth: Payer: Self-pay | Admitting: Neurology

## 2015-09-13 DIAGNOSIS — R928 Other abnormal and inconclusive findings on diagnostic imaging of breast: Secondary | ICD-10-CM

## 2015-09-13 NOTE — Telephone Encounter (Deleted)
Czareyna with [employer] is calling regarding prior authorization for medication

## 2015-09-15 ENCOUNTER — Ambulatory Visit (HOSPITAL_COMMUNITY): Payer: Self-pay | Admitting: Psychiatry

## 2015-09-17 ENCOUNTER — Other Ambulatory Visit (HOSPITAL_COMMUNITY): Payer: Self-pay | Admitting: Psychiatry

## 2015-09-17 ENCOUNTER — Other Ambulatory Visit: Payer: Self-pay | Admitting: Family Medicine

## 2015-09-17 ENCOUNTER — Telehealth: Payer: Self-pay | Admitting: Family Medicine

## 2015-09-17 NOTE — Telephone Encounter (Signed)
Additional imaging of breast ordered but no appt info entered or scheduled as yet, pls check imaging column for referral, pt needs this. ?? pls ask

## 2015-09-18 NOTE — Telephone Encounter (Signed)
Called [employer] to check the status of medication delivery. They stated that the patient refused to set up delivery because [employer] was not going to cover her medication. I called the patient and she informed me that medication was going to have to be billed through her husbands [employer] consultant.

## 2015-09-18 NOTE — Telephone Encounter (Signed)
Called patient to inform her that we were working on getting the medication here and would call her back later in the week to schedule her apt.

## 2015-09-20 ENCOUNTER — Other Ambulatory Visit (HOSPITAL_COMMUNITY): Payer: Self-pay | Admitting: Psychiatry

## 2015-09-20 ENCOUNTER — Ambulatory Visit: Payer: Managed Care, Other (non HMO) | Admitting: Neurology

## 2015-09-21 NOTE — Telephone Encounter (Signed)
Error

## 2015-09-25 ENCOUNTER — Encounter: Payer: Self-pay | Admitting: Family Medicine

## 2015-09-25 ENCOUNTER — Telehealth (HOSPITAL_COMMUNITY): Payer: Self-pay | Admitting: *Deleted

## 2015-09-25 NOTE — Telephone Encounter (Signed)
Please call in ambien CR 12.5 mg, 2 refills

## 2015-09-25 NOTE — Telephone Encounter (Signed)
Pt came into office stating she is out of her Zolpidem CR 12.5 mg. Looked in pt chart and noticed that on 08-30-15, medication was removed from pt chart per pt request. Informed pt of this and she stated that she is not aware of this and stated that she is still taking her Zolpidem. She remember telling Dr. Harrington Challenger that this medication is working. Per pt to see if Dr. Harrington Challenger could refill her medication due to her being completely out and have not slept for 2 days. Pt number is [phone].

## 2015-09-26 ENCOUNTER — Telehealth (HOSPITAL_COMMUNITY): Payer: Self-pay | Admitting: *Deleted

## 2015-09-26 MED ORDER — ZOLPIDEM TARTRATE ER 12.5 MG PO TBCR: 12.5000 mg | EXTENDED_RELEASE_TABLET | Freq: Every evening | ORAL | Status: DC | PRN

## 2015-09-26 NOTE — Telephone Encounter (Signed)
Medication called in to pt pharmacy and spoke with Cheri Rous

## 2015-09-26 NOTE — Telephone Encounter (Signed)
Per Dr. Harrington Challenger to call in script for pt with 2 refills. Called pt pharmacy and spoke with Cheri Rous and he stated they will get script ready for pt

## 2015-09-30 ENCOUNTER — Other Ambulatory Visit: Payer: Self-pay | Admitting: Family Medicine

## 2015-10-03 ENCOUNTER — Ambulatory Visit (HOSPITAL_COMMUNITY)
Admission: RE | Admit: 2015-10-03 | Discharge: 2015-10-03 | Disposition: A | Discharge status: Home / Self Care | Payer: Managed Care, Other (non HMO) | Source: Ambulatory Visit | Attending: Family Medicine | Admitting: Family Medicine

## 2015-10-03 ENCOUNTER — Other Ambulatory Visit: Payer: Self-pay | Admitting: Family Medicine

## 2015-10-03 DIAGNOSIS — R928 Other abnormal and inconclusive findings on diagnostic imaging of breast: Secondary | ICD-10-CM | POA: Insufficient documentation

## 2015-10-12 ENCOUNTER — Ambulatory Visit (INDEPENDENT_AMBULATORY_CARE_PROVIDER_SITE_OTHER): Payer: Managed Care, Other (non HMO) | Admitting: Psychiatry

## 2015-10-12 ENCOUNTER — Telehealth: Payer: Self-pay | Admitting: Neurology

## 2015-10-12 ENCOUNTER — Encounter (HOSPITAL_COMMUNITY): Payer: Self-pay | Admitting: Psychiatry

## 2015-10-12 DIAGNOSIS — F3175 Bipolar disorder, in partial remission, most recent episode depressed: Secondary | ICD-10-CM | POA: Diagnosis not present
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
## 2015-10-12 NOTE — Telephone Encounter (Signed)
Patient returned call regarding BOTOX appointment

## 2015-10-12 NOTE — Patient Instructions (Signed)
Discussed orally 

## 2015-10-13 ENCOUNTER — Other Ambulatory Visit: Payer: Self-pay | Admitting: Family Medicine

## 2015-10-15 ENCOUNTER — Other Ambulatory Visit: Payer: Self-pay | Admitting: Family Medicine

## 2015-10-20 ENCOUNTER — Other Ambulatory Visit: Payer: Self-pay | Admitting: Family Medicine

## 2015-10-23 ENCOUNTER — Telehealth: Payer: Self-pay | Admitting: Family Medicine

## 2015-10-23 ENCOUNTER — Other Ambulatory Visit: Payer: Self-pay | Admitting: Family Medicine

## 2015-10-23 MED ORDER — OLOPATADINE HCL 0.1 % OP SOLN: OPHTHALMIC | Status: DC

## 2015-10-23 MED ORDER — AZITHROMYCIN 250 MG PO TABS: ORAL_TABLET | ORAL | Status: DC

## 2015-10-23 NOTE — Telephone Encounter (Signed)
States she has felt bad the past week, harsh cough, producing thick mucus, some hoarseness and sinus congestion, chills and bodyaches and unsure of temp. Wants something called in. Please advise

## 2015-10-23 NOTE — Telephone Encounter (Signed)
pls end z pack x 1 ,a nd let her know if persists needs to call for appt and to submit sputum

## 2015-10-23 NOTE — Telephone Encounter (Signed)
Jane English is asking if Jane English would return her call

## 2015-10-23 NOTE — Telephone Encounter (Signed)
Patient aware and med sent  

## 2015-10-23 NOTE — Addendum Note (Signed)
Addended by: Eual Fines on: 10/23/2015 04:26 PM   Modules accepted: Orders

## 2015-11-08 ENCOUNTER — Ambulatory Visit: Payer: Self-pay | Admitting: Family Medicine

## 2015-11-09 ENCOUNTER — Ambulatory Visit (HOSPITAL_COMMUNITY): Payer: Self-pay | Admitting: Psychiatry

## 2015-11-17 ENCOUNTER — Other Ambulatory Visit: Payer: Self-pay | Admitting: Family Medicine

## 2015-11-17 ENCOUNTER — Other Ambulatory Visit: Payer: Self-pay | Admitting: Internal Medicine

## 2015-11-21 ENCOUNTER — Ambulatory Visit (HOSPITAL_COMMUNITY): Payer: Self-pay | Admitting: Psychiatry

## 2015-11-23 NOTE — Telephone Encounter (Signed)
Spoke with patient to inform her that IV came back and stated she was not covered. [place] ID: [id number] [place] 08/18/1965. Informed the patient that this may cause her to have to r/s apt. She stated that she understood. Will call her back tomorrow and submit new information today.

## 2015-11-23 NOTE — Telephone Encounter (Signed)
Submitted

## 2015-11-27 ENCOUNTER — Telehealth: Payer: Self-pay | Admitting: Neurology

## 2015-11-27 NOTE — Telephone Encounter (Signed)
Patient just wants to make sure her appt on Wednesday 11/29/15 for her botox has not been postponed because she said she gave you her husband insurance. I guess she just wants to make sure everything came out okay. The best number to contact is [phone]

## 2015-11-27 NOTE — Telephone Encounter (Signed)
Returned patients call. She will have to r/s apt because her insurance came back terminated and we are still waiting for verification to come back on the new policy. Left her a VM informing her the apt would have to be move and asking her to call me back to reschedule.

## 2015-11-28 ENCOUNTER — Other Ambulatory Visit: Payer: Self-pay | Admitting: *Deleted

## 2015-11-28 ENCOUNTER — Other Ambulatory Visit: Payer: Self-pay

## 2015-11-28 MED ORDER — ONABOTULINUMTOXINA 200 UNITS IJ SOLR: 200.0000 [IU] | INTRAMUSCULAR | Status: DC

## 2015-11-28 NOTE — Telephone Encounter (Signed)
Called patient again to give her update on process approvals and r/s her apt. Left a VM asking her to return my call.

## 2015-11-29 ENCOUNTER — Ambulatory Visit: Payer: Managed Care, Other (non HMO) | Admitting: Neurology

## 2015-11-30 ENCOUNTER — Other Ambulatory Visit (HOSPITAL_COMMUNITY): Payer: Self-pay | Admitting: Psychiatry

## 2015-12-04 ENCOUNTER — Telehealth: Payer: Self-pay | Admitting: Neurology

## 2015-12-04 NOTE — Telephone Encounter (Signed)
Called patient back and left a VM asking her to return my call.

## 2015-12-04 NOTE — Telephone Encounter (Signed)
Patient called to request time change of BOTOX appointment.

## 2015-12-06 ENCOUNTER — Ambulatory Visit (INDEPENDENT_AMBULATORY_CARE_PROVIDER_SITE_OTHER): Payer: Medicare HMO | Admitting: Psychiatry

## 2015-12-06 ENCOUNTER — Encounter (HOSPITAL_COMMUNITY): Payer: Self-pay | Admitting: Psychiatry

## 2015-12-06 VITALS — BP 97/60 | HR 80 | Ht 66.0 in | Wt 169.4 lb

## 2015-12-06 DIAGNOSIS — F4310 Post-traumatic stress disorder, unspecified: Secondary | ICD-10-CM

## 2015-12-06 DIAGNOSIS — F3175 Bipolar disorder, in partial remission, most recent episode depressed: Secondary | ICD-10-CM

## 2015-12-06 MED ORDER — CARBAMAZEPINE 100 MG PO CHEW: CHEWABLE_TABLET | ORAL | Status: DC

## 2015-12-06 MED ORDER — LAMOTRIGINE 25 MG PO TABS: 25.0000 mg | ORAL_TABLET | Freq: Every day | ORAL | Status: DC

## 2015-12-06 MED ORDER — ALPRAZOLAM 1 MG PO TABS: 1.0000 mg | ORAL_TABLET | Freq: Three times a day (TID) | ORAL | Status: DC | PRN

## 2015-12-06 MED ORDER — ZOLPIDEM TARTRATE ER 12.5 MG PO TBCR: 12.5000 mg | EXTENDED_RELEASE_TABLET | Freq: Every evening | ORAL | Status: DC | PRN

## 2015-12-06 MED ORDER — ZOLPIDEM TARTRATE ER 12.5 MG PO TBCR: 12.5000 mg | EXTENDED_RELEASE_TABLET | Freq: Every day | ORAL | Status: DC

## 2015-12-06 MED ORDER — ESCITALOPRAM OXALATE 20 MG PO TABS: 20.0000 mg | ORAL_TABLET | Freq: Every day | ORAL | Status: DC
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
## 2015-12-07 ENCOUNTER — Ambulatory Visit: Payer: Managed Care, Other (non HMO) | Admitting: Neurology

## 2015-12-07 ENCOUNTER — Ambulatory Visit (HOSPITAL_COMMUNITY): Payer: Self-pay | Admitting: Psychiatry

## 2015-12-07 ENCOUNTER — Encounter: Payer: Self-pay | Admitting: Neurology

## 2015-12-07 ENCOUNTER — Ambulatory Visit (INDEPENDENT_AMBULATORY_CARE_PROVIDER_SITE_OTHER): Payer: 59 | Admitting: Neurology

## 2015-12-07 VITALS — BP 109/70 | HR 80 | Ht 66.0 in | Wt 168.0 lb

## 2015-12-07 DIAGNOSIS — G43719 Chronic migraine without aura, intractable, without status migrainosus: Secondary | ICD-10-CM | POA: Diagnosis not present

## 2015-12-07 MED ORDER — RIZATRIPTAN BENZOATE 10 MG PO TBDP: 10.0000 mg | ORAL_TABLET | ORAL | Status: DC | PRN

## 2015-12-07 MED ORDER — ONABOTULINUMTOXINA 100 UNITS IJ SOLR
200.0000 [IU] | Freq: Once | INTRAMUSCULAR | Status: AC
  Administered 2015-12-07: 200 [IU] via INTRAMUSCULAR
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
## 2015-12-07 NOTE — Progress Notes (Signed)
**  Botox 100units x 2 vials, Lot [id number], Exp 03/2018, [place] [id number], office supply**mck

## 2015-12-08 ENCOUNTER — Ambulatory Visit (HOSPITAL_COMMUNITY): Payer: Self-pay | Admitting: Psychiatry

## 2015-12-08 DIAGNOSIS — M542 Cervicalgia: Secondary | ICD-10-CM | POA: Diagnosis not present

## 2015-12-08 DIAGNOSIS — M21331 Wrist drop, right wrist: Secondary | ICD-10-CM | POA: Diagnosis not present

## 2015-12-15 ENCOUNTER — Ambulatory Visit (HOSPITAL_COMMUNITY): Payer: Self-pay | Admitting: Psychiatry

## 2015-12-24 ENCOUNTER — Other Ambulatory Visit: Payer: Self-pay | Admitting: Internal Medicine

## 2015-12-28 ENCOUNTER — Ambulatory Visit (INDEPENDENT_AMBULATORY_CARE_PROVIDER_SITE_OTHER): Payer: 59 | Admitting: Family Medicine

## 2015-12-28 ENCOUNTER — Encounter: Payer: Self-pay | Admitting: Family Medicine

## 2015-12-28 VITALS — BP 102/60 | HR 72 | Resp 18 | Ht 66.0 in | Wt 173.0 lb

## 2015-12-28 DIAGNOSIS — F411 Generalized anxiety disorder: Secondary | ICD-10-CM

## 2015-12-28 DIAGNOSIS — M5090 Cervical disc disorder, unspecified, unspecified cervical region: Secondary | ICD-10-CM | POA: Diagnosis not present

## 2015-12-28 DIAGNOSIS — F3161 Bipolar disorder, current episode mixed, mild: Secondary | ICD-10-CM | POA: Diagnosis not present

## 2015-12-28 DIAGNOSIS — G5691 Unspecified mononeuropathy of right upper limb: Secondary | ICD-10-CM | POA: Diagnosis not present

## 2015-12-28 DIAGNOSIS — I10 Essential (primary) hypertension: Secondary | ICD-10-CM

## 2015-12-28 DIAGNOSIS — M792 Neuralgia and neuritis, unspecified: Secondary | ICD-10-CM

## 2015-12-28 NOTE — Progress Notes (Signed)
   Subjective:    Patient ID: Jane English, female    DOB: 1970-12-03, 45 y.o.   MRN: [id number]  HPI Pt in with right wrist pain, primarily ring an mid finger x 4 month, sent from her pain management Doc for nerve testing as pain is uncontrolled and from neck to hand Had nerve test yesterday, Dr Amite Desanctis and states was told no evidence of nerve damage. States she needs help as the pain is unbearable. C/o excess weight gain and overeating, stopped psych meds as she held them accountable for weight gain, realizes she needs help with mood instability and anxiety    Review of Systems See HPI Denies recent fever or chills. Denies sinus pressure, nasal congestion, ear pain or sore throat. Denies chest congestion, productive cough or wheezing. Denies chest pains, palpitations and leg swelling Denies abdominal pain, nausea, vomiting,diarrhea or constipation.   Denies dysuria, frequency, hesitancy or incontinence.  Denies skin break down or rash.        Objective:   Physical Exam BP 102/60 mmHg  Pulse 72  Resp 18  Ht 5\' 6"  (1.676 m)  Wt 173 lb (78.472 kg)  BMI 27.94 kg/m2  SpO2 97%  LMP 04/16/2015 Patient alert and oriented and in no cardiopulmonary distress.  HEENT: No facial asymmetry, EOMI,   oropharynx pink and moist.  Neck supple no JVD, no mass.  Chest: Clear to auscultation bilaterally.  CVS: S1, S2 no murmurs, no S3.Regular rate.  ABD: Soft non tender.   Ext: No edema  MS: Adequate ROM spine, shoulders, hips and knees.  Skin: Intact, no ulcerations or rash noted.  Psych: Good eye contact, normal affect. Memory intact  anxious and  depressed appearing.  CNS: CN 2-12 intact, power,  normal throughout.no focal deficits noted.      Assessment & Plan:  Cervical neck pain with evidence of disc disease Past h/o c spine surgery, now with disabling right hand pain x4 months, suggestive of recurrent or new C spine disease, need to obtain info from pain clinic/  neurologist where she had nerve conduction test yesterday for direction as to what needs to be done moving forward 4 month h/o progressively worsening mid and ring fingers of right hand, eval by hand surgeon may be appropriate will still attempt to get recent nerve conduction test  Anxiety state Increased and uncontrolled, overeating, discontinued her psych medication stating that it was causing weight gain, now increased anxiety and mood instability, plans to sched appt with psych when she leaves appt today, which she absolutely needs to do  Bipolar disorder Currently uncontrolled due to medication non compliance, re educated pt re danger of this behavior and encouraged her not to rept same, soonest avail;able appt with her psych is needed  Essential hypertension Controlled, no change in medication   Neuropathic pain of finger of right hand Refer hand surgery for further eval, will send for recent nerve conduction tests also

## 2015-12-28 NOTE — Patient Instructions (Signed)
Annual wellness in 3 month, call if you need me before  I will request info re nerve test, before attempting to get MRI of neck   Please schedule an appt with Dr Harrington Challenger so that you can get help to regain control of eating and mood

## 2015-12-29 ENCOUNTER — Encounter (HOSPITAL_COMMUNITY): Payer: Self-pay | Admitting: Psychiatry

## 2015-12-29 ENCOUNTER — Ambulatory Visit (INDEPENDENT_AMBULATORY_CARE_PROVIDER_SITE_OTHER): Payer: 59 | Admitting: Psychiatry

## 2015-12-29 DIAGNOSIS — F3175 Bipolar disorder, in partial remission, most recent episode depressed: Secondary | ICD-10-CM
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
## 2015-12-29 NOTE — Patient Instructions (Signed)
Discussed orally 

## 2015-12-31 ENCOUNTER — Telehealth: Payer: Self-pay | Admitting: Family Medicine

## 2015-12-31 DIAGNOSIS — M792 Neuralgia and neuritis, unspecified: Secondary | ICD-10-CM | POA: Insufficient documentation

## 2015-12-31 DIAGNOSIS — R7303 Prediabetes: Secondary | ICD-10-CM

## 2015-12-31 NOTE — Telephone Encounter (Signed)
Courtney pls let pt know that I a m referring her to hand ortho Doc in [place], based on main c/o worsening finger pain, I will send on her nerve conduction tests done recently in [facility] once I have them. Needs hBA1C, chem 7 and EGFR in next 2 weeks, non fast Also , pls let her know keep AWV in nnext several months, BUT also being scheduled for Pap which she needs 09/28 or after   Darius Bump referral entered, and pls also sched her pap and let her know, had abn cells in 2016 so NEEDS the rept test  Thanks!  ?? pls ask

## 2015-12-31 NOTE — Assessment & Plan Note (Signed)
Controlled, no change in medication  

## 2015-12-31 NOTE — Assessment & Plan Note (Signed)
Increased and uncontrolled, overeating, discontinued her psych medication stating that it was causing weight gain, now increased anxiety and mood instability, plans to sched appt with psych when she leaves appt today, which she absolutely needs to do

## 2015-12-31 NOTE — Assessment & Plan Note (Signed)
Refer hand surgery for further eval, will send for recent nerve conduction tests also

## 2015-12-31 NOTE — Assessment & Plan Note (Signed)
Currently uncontrolled due to medication non compliance, re educated pt re danger of this behavior and encouraged her not to rept same, soonest avail;able appt with her psych is needed

## 2015-12-31 NOTE — Assessment & Plan Note (Addendum)
Past h/o c spine surgery, now with disabling right hand pain x4 months, suggestive of recurrent or new C spine disease, need to obtain info from pain clinic/ neurologist where she had nerve conduction test yesterday for direction as to what needs to be done moving forward 4 month h/o progressively worsening mid and ring fingers of right hand, eval by hand surgeon may be appropriate will still attempt to get recent nerve conduction test

## 2016-01-01 NOTE — Telephone Encounter (Signed)
Called and left voicemail for patient notifying of need for repeat labs in 2 weeks and asked for a return call re: appointments

## 2016-01-01 NOTE — Telephone Encounter (Signed)
Referral has been made to Dr. Fredna Dow in [place], they will contact [facility] and schedule a date & time

## 2016-01-01 NOTE — Addendum Note (Signed)
Addended by: Denman George B on: 01/01/2016 03:24 PM   Modules accepted: Orders

## 2016-01-09 ENCOUNTER — Ambulatory Visit (HOSPITAL_COMMUNITY): Payer: Self-pay | Admitting: Psychiatry

## 2016-01-10 ENCOUNTER — Telehealth: Payer: Self-pay

## 2016-01-10 DIAGNOSIS — M25531 Pain in right wrist: Secondary | ICD-10-CM

## 2016-01-10 NOTE — Addendum Note (Signed)
Addended by: Denman George B on: 01/10/2016 12:14 PM   Modules accepted: Orders

## 2016-01-10 NOTE — Telephone Encounter (Signed)
Yes pls refer I will sign

## 2016-01-10 NOTE — Telephone Encounter (Signed)
Referral entered  

## 2016-01-11 ENCOUNTER — Telehealth: Payer: Self-pay

## 2016-01-11 NOTE — Telephone Encounter (Signed)
called wanting a compound cream at [facility] that she used some of over the weekend from a friend and it helped a lot for her shoulder and knee pain. Called in to [facility] per Dr. Abbott Pao aware

## 2016-01-13 ENCOUNTER — Other Ambulatory Visit: Payer: Self-pay | Admitting: Internal Medicine

## 2016-01-15 ENCOUNTER — Ambulatory Visit (HOSPITAL_COMMUNITY): Payer: Self-pay | Admitting: Psychiatry

## 2016-01-15 ENCOUNTER — Telehealth: Payer: Self-pay | Admitting: Family Medicine

## 2016-01-15 ENCOUNTER — Telehealth: Payer: Self-pay | Admitting: Internal Medicine

## 2016-01-15 ENCOUNTER — Encounter (HOSPITAL_COMMUNITY): Payer: Self-pay | Admitting: Psychiatry

## 2016-01-15 MED ORDER — SULFAMETHOXAZOLE-TRIMETHOPRIM 800-160 MG PO TABS: 1.0000 | ORAL_TABLET | Freq: Two times a day (BID) | ORAL | Status: DC

## 2016-01-15 MED ORDER — BENZONATATE 100 MG PO CAPS: 100.0000 mg | ORAL_CAPSULE | Freq: Two times a day (BID) | ORAL | Status: DC | PRN

## 2016-01-15 MED ORDER — PANTOPRAZOLE SODIUM 40 MG PO TBEC: DELAYED_RELEASE_TABLET | ORAL | Status: DC

## 2016-01-15 MED ORDER — LUBIPROSTONE 24 MCG PO CAPS: 24.0000 ug | ORAL_CAPSULE | Freq: Two times a day (BID) | ORAL | Status: DC

## 2016-01-15 MED ORDER — PREDNISONE 5 MG (21) PO TBPK: 5.0000 mg | ORAL_TABLET | ORAL | Status: DC

## 2016-01-15 NOTE — Telephone Encounter (Signed)
Patient needed an appointment in follow up as suggested in Dr Vena Rua last office note. I have sent refills of amitiza and pantoprazole to the pharmacy until her office visit which I scheduled for 03/19/16.

## 2016-01-15 NOTE — Telephone Encounter (Signed)
Patient is calling stating that she is coughing, real congested and has a headache and is asking what can she take, please advise?

## 2016-01-15 NOTE — Telephone Encounter (Signed)
C/o coughing green phlegm, sinus congestion, some fever, x 1 week. Has some SOB and has been using her allergy meds, nosespray and neb. Also using dayquil and sudafed with little relief. Offered appt but they only have one car and [facility] [place] has it so she wouldn't have any way. Please advise

## 2016-01-15 NOTE — Telephone Encounter (Signed)
pls document more clinical history, fever etc, so this can be addressed, duration, verify current use of allergy meds also

## 2016-01-15 NOTE — Addendum Note (Signed)
Addended by: Tula Nakayama E on: 01/15/2016 01:49 PM   Modules accepted: Orders

## 2016-01-15 NOTE — Telephone Encounter (Signed)
Patient aware meds sent

## 2016-01-15 NOTE — Telephone Encounter (Signed)
3 meds sent, pls send if needed fluconazole #2 only

## 2016-01-16 ENCOUNTER — Telehealth (HOSPITAL_COMMUNITY): Payer: Self-pay | Admitting: *Deleted

## 2016-01-18 DIAGNOSIS — M25531 Pain in right wrist: Secondary | ICD-10-CM | POA: Diagnosis not present

## 2016-01-23 ENCOUNTER — Ambulatory Visit (HOSPITAL_COMMUNITY): Payer: Self-pay | Admitting: Psychiatry

## 2016-01-24 ENCOUNTER — Encounter (HOSPITAL_COMMUNITY): Payer: Self-pay | Admitting: Psychiatry

## 2016-01-25 ENCOUNTER — Encounter (HOSPITAL_COMMUNITY): Payer: Self-pay | Admitting: Psychiatry

## 2016-01-25 ENCOUNTER — Ambulatory Visit (INDEPENDENT_AMBULATORY_CARE_PROVIDER_SITE_OTHER): Payer: 59 | Admitting: Psychiatry

## 2016-01-25 VITALS — BP 112/68 | HR 74 | Ht 66.0 in | Wt 169.4 lb

## 2016-01-25 DIAGNOSIS — F3175 Bipolar disorder, in partial remission, most recent episode depressed: Secondary | ICD-10-CM

## 2016-01-25 DIAGNOSIS — F4310 Post-traumatic stress disorder, unspecified: Secondary | ICD-10-CM | POA: Diagnosis not present

## 2016-01-25 MED ORDER — LAMOTRIGINE 25 MG PO TABS: 25.0000 mg | ORAL_TABLET | Freq: Every day | ORAL | Status: DC

## 2016-01-25 MED ORDER — ESCITALOPRAM OXALATE 20 MG PO TABS: 20.0000 mg | ORAL_TABLET | Freq: Every day | ORAL | Status: DC

## 2016-01-25 MED ORDER — ALPRAZOLAM 1 MG PO TABS: 1.0000 mg | ORAL_TABLET | Freq: Three times a day (TID) | ORAL | Status: DC | PRN

## 2016-01-25 MED ORDER — ZOLPIDEM TARTRATE ER 12.5 MG PO TBCR: 12.5000 mg | EXTENDED_RELEASE_TABLET | Freq: Every day | ORAL | Status: DC
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
## 2016-01-28 ENCOUNTER — Other Ambulatory Visit: Payer: Self-pay | Admitting: Family Medicine

## 2016-02-09 DIAGNOSIS — M1711 Unilateral primary osteoarthritis, right knee: Secondary | ICD-10-CM | POA: Diagnosis not present

## 2016-02-09 DIAGNOSIS — G894 Chronic pain syndrome: Secondary | ICD-10-CM | POA: Diagnosis not present

## 2016-02-09 DIAGNOSIS — M6250 Muscle wasting and atrophy, not elsewhere classified, unspecified site: Secondary | ICD-10-CM | POA: Diagnosis not present

## 2016-02-09 DIAGNOSIS — M961 Postlaminectomy syndrome, not elsewhere classified: Secondary | ICD-10-CM | POA: Diagnosis not present

## 2016-02-12 ENCOUNTER — Other Ambulatory Visit: Payer: Self-pay | Admitting: Family Medicine

## 2016-02-14 ENCOUNTER — Telehealth (HOSPITAL_COMMUNITY): Payer: Self-pay | Admitting: *Deleted

## 2016-02-14 NOTE — Telephone Encounter (Signed)
We cannot refill controlled drugs early

## 2016-02-14 NOTE — Telephone Encounter (Signed)
phone call from patient.  She was cleaning house and threw her Xanax and her other medicine in the trash.  She wants to know if you will write new prescription.

## 2016-02-15 NOTE — Telephone Encounter (Signed)
Informed pt with what provider stated and she showed understanding.

## 2016-02-17 ENCOUNTER — Encounter (HOSPITAL_COMMUNITY): Payer: Self-pay | Admitting: Emergency Medicine

## 2016-02-17 DIAGNOSIS — E669 Obesity, unspecified: Secondary | ICD-10-CM | POA: Diagnosis not present

## 2016-02-17 DIAGNOSIS — F10129 Alcohol abuse with intoxication, unspecified: Secondary | ICD-10-CM | POA: Insufficient documentation

## 2016-02-17 DIAGNOSIS — S59902A Unspecified injury of left elbow, initial encounter: Secondary | ICD-10-CM | POA: Diagnosis not present

## 2016-02-17 DIAGNOSIS — I10 Essential (primary) hypertension: Secondary | ICD-10-CM | POA: Diagnosis not present

## 2016-02-17 DIAGNOSIS — Z7951 Long term (current) use of inhaled steroids: Secondary | ICD-10-CM | POA: Insufficient documentation

## 2016-02-17 DIAGNOSIS — J45909 Unspecified asthma, uncomplicated: Secondary | ICD-10-CM | POA: Insufficient documentation

## 2016-02-17 DIAGNOSIS — Z79899 Other long term (current) drug therapy: Secondary | ICD-10-CM | POA: Insufficient documentation

## 2016-02-17 DIAGNOSIS — Y998 Other external cause status: Secondary | ICD-10-CM | POA: Diagnosis not present

## 2016-02-17 DIAGNOSIS — S50312A Abrasion of left elbow, initial encounter: Secondary | ICD-10-CM | POA: Diagnosis not present

## 2016-02-17 DIAGNOSIS — Z8619 Personal history of other infectious and parasitic diseases: Secondary | ICD-10-CM | POA: Insufficient documentation

## 2016-02-17 DIAGNOSIS — Z880 Allergy status to penicillin: Secondary | ICD-10-CM | POA: Diagnosis not present

## 2016-02-17 DIAGNOSIS — K219 Gastro-esophageal reflux disease without esophagitis: Secondary | ICD-10-CM | POA: Diagnosis not present

## 2016-02-17 DIAGNOSIS — F329 Major depressive disorder, single episode, unspecified: Secondary | ICD-10-CM | POA: Insufficient documentation

## 2016-02-17 DIAGNOSIS — R011 Cardiac murmur, unspecified: Secondary | ICD-10-CM | POA: Diagnosis not present

## 2016-02-17 DIAGNOSIS — Z792 Long term (current) use of antibiotics: Secondary | ICD-10-CM | POA: Diagnosis not present

## 2016-02-17 DIAGNOSIS — Z862 Personal history of diseases of the blood and blood-forming organs and certain disorders involving the immune mechanism: Secondary | ICD-10-CM | POA: Diagnosis not present

## 2016-02-17 DIAGNOSIS — Z91040 Latex allergy status: Secondary | ICD-10-CM | POA: Diagnosis not present

## 2016-02-17 DIAGNOSIS — W01198A Fall on same level from slipping, tripping and stumbling with subsequent striking against other object, initial encounter: Secondary | ICD-10-CM | POA: Insufficient documentation

## 2016-02-17 DIAGNOSIS — Y92481 Parking lot as the place of occurrence of the external cause: Secondary | ICD-10-CM | POA: Diagnosis not present

## 2016-02-17 DIAGNOSIS — Y9389 Activity, other specified: Secondary | ICD-10-CM | POA: Insufficient documentation

## 2016-02-17 DIAGNOSIS — E119 Type 2 diabetes mellitus without complications: Secondary | ICD-10-CM | POA: Diagnosis not present

## 2016-02-17 DIAGNOSIS — G43909 Migraine, unspecified, not intractable, without status migrainosus: Secondary | ICD-10-CM | POA: Insufficient documentation

## 2016-02-17 DIAGNOSIS — S0990XA Unspecified injury of head, initial encounter: Secondary | ICD-10-CM | POA: Diagnosis not present
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
## 2016-02-18 ENCOUNTER — Emergency Department (HOSPITAL_COMMUNITY): Payer: 59

## 2016-02-18 ENCOUNTER — Emergency Department (HOSPITAL_COMMUNITY)
Admission: EM | Admit: 2016-02-18 | Discharge: 2016-02-18 | Disposition: A | Discharge status: Home / Self Care | Payer: 59 | Attending: Emergency Medicine | Admitting: Emergency Medicine

## 2016-02-18 DIAGNOSIS — W19XXXA Unspecified fall, initial encounter: Secondary | ICD-10-CM

## 2016-02-18 DIAGNOSIS — F10129 Alcohol abuse with intoxication, unspecified: Secondary | ICD-10-CM | POA: Diagnosis not present

## 2016-02-18 DIAGNOSIS — F10929 Alcohol use, unspecified with intoxication, unspecified: Secondary | ICD-10-CM

## 2016-02-18 DIAGNOSIS — S59902A Unspecified injury of left elbow, initial encounter: Secondary | ICD-10-CM | POA: Diagnosis not present

## 2016-02-18 DIAGNOSIS — S0990XA Unspecified injury of head, initial encounter: Secondary | ICD-10-CM | POA: Diagnosis not present

## 2016-02-18 LAB — BASIC METABOLIC PANEL
Anion gap: 14 (ref 5–15)
BUN: 9 mg/dL (ref 6–20)
CHLORIDE: 101 mmol/L (ref 101–111)
CO2: 20 mmol/L — AB (ref 22–32)
Calcium: 8.8 mg/dL — ABNORMAL LOW (ref 8.9–10.3)
Creatinine, Ser: 0.91 mg/dL (ref 0.44–1.00)
GFR calc Af Amer: >60 mL/min (ref >60)
GFR calc non Af Amer: >60 mL/min (ref >60)
GLUCOSE: 159 mg/dL — AB (ref 65–99)
Potassium: 2.4 mmol/L — CL (ref 3.5–5.1)
Sodium: 135 mmol/L (ref 135–145)

## 2016-02-18 LAB — ETHANOL: ALCOHOL ETHYL (B): 181 mg/dL — AB (ref <5)

## 2016-02-18 LAB — CBC WITH DIFFERENTIAL/PLATELET
BASOS PCT: 0 %
Basophils Absolute: 0 10*3/uL (ref 0.0–0.1)
EOS ABS: 0.3 10*3/uL (ref 0.0–0.7)
Eosinophils Relative: 4 %
HEMATOCRIT: 35.8 % — AB (ref 36.0–46.0)
HEMOGLOBIN: 12 g/dL (ref 12.0–15.0)
LYMPHS ABS: 3.6 10*3/uL (ref 0.7–4.0)
Lymphocytes Relative: 41 %
MCH: 30.8 pg (ref 26.0–34.0)
MCHC: 33.5 g/dL (ref 30.0–36.0)
MCV: 91.8 fL (ref 78.0–100.0)
MONO ABS: 0.7 10*3/uL (ref 0.1–1.0)
MONOS PCT: 8 %
NEUTROS PCT: 47 %
Neutro Abs: 4 10*3/uL (ref 1.7–7.7)
Platelets: 262 10*3/uL (ref 150–400)
RBC: 3.9 MIL/uL (ref 3.87–5.11)
RDW: 12.7 % (ref 11.5–15.5)
WBC: 8.7 10*3/uL (ref 4.0–10.5)

## 2016-02-18 MED ORDER — METOCLOPRAMIDE HCL 5 MG/ML IJ SOLN
10.0000 mg | Freq: Once | INTRAMUSCULAR | Status: AC
  Administered 2016-02-18: 10 mg via INTRAVENOUS
  Filled 2016-02-18: qty 2

## 2016-02-18 MED ORDER — SODIUM CHLORIDE 0.9 % IV BOLUS (SEPSIS)
1000.0000 mL | Freq: Once | INTRAVENOUS | Status: AC
Start: 2016-02-18 — End: 2016-02-18
  Administered 2016-02-18: 1000 mL via INTRAVENOUS

## 2016-02-18 MED ORDER — POTASSIUM CHLORIDE 10 MEQ/100ML IV SOLN
10.0000 meq | INTRAVENOUS | Status: AC
  Administered 2016-02-18 (×2): 10 meq via INTRAVENOUS
  Filled 2016-02-18 (×2): qty 100

## 2016-02-18 MED ORDER — MAGNESIUM SULFATE 2 GM/50ML IV SOLN
2.0000 g | Freq: Once | INTRAVENOUS | Status: AC
  Administered 2016-02-18: 2 g via INTRAVENOUS
  Filled 2016-02-18: qty 50
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
## 2016-02-18 NOTE — Discharge Instructions (Signed)
Alcohol Intoxication Jane English, your potassium was replaced in the ER.  Your CT scan and xray do not show any injury.  See a primary care doctor within 3 days for close follow up. If symptoms worsen, come back to the ED immediately. Thank you. Alcohol intoxication occurs when you drink enough alcohol that it affects your ability to function. It can be mild or very severe. Drinking a lot of alcohol in a short time is called binge drinking. This can be very harmful. Drinking alcohol can also be more dangerous if you are taking medicines or other drugs. Some of the effects caused by alcohol may include:  Loss of coordination.  Changes in mood and behavior.  Unclear thinking.  Trouble talking (slurred speech).  Throwing up (vomiting).  Confusion.  Slowed breathing.  Twitching and shaking (seizures).  Loss of consciousness. HOME CARE  Do not drive after drinking alcohol.  Drink enough water and fluids to keep your pee (urine) clear or pale yellow. Avoid caffeine.  Only take medicine as told by your doctor. GET HELP IF:  You throw up (vomit) many times.  You do not feel better after a few days.  You frequently have alcohol intoxication. Your doctor can help decide if you should see a substance use treatment counselor. GET HELP RIGHT AWAY IF:  You become shaky when you stop drinking.  You have twitching and shaking.  You throw up blood. It may look bright red or like coffee grounds.  You notice blood in your poop (bowel movements).  You become lightheaded or pass out (faint). MAKE SURE YOU:   Understand these instructions.  Will watch your condition.  Will get help right away if you are not doing well or get worse.   This information is not intended to replace advice given to you by your health care provider. Make sure you discuss any questions you have with your health care provider.   Document Released: 04/08/2008 Document Revised: 06/23/2013 Document Reviewed:  03/26/2013 Elsevier Interactive Patient Education [employer].

## 2016-02-18 NOTE — ED Provider Notes (Signed)
CSN: [id number]     Arrival date & time 02/17/16  2308 History   By signing my name below, I, Forrestine Him, attest that this documentation has been prepared under the direction and in the presence of Everlene Balls, MD.  Electronically Signed: Forrestine Him, ED Scribe. 02/18/2016. 12:33 AM.   Chief Complaint  Patient presents with  . Alcohol Intoxication  . Fall   The history is provided by the patient. No language interpreter was used.    LEVEL 5 CAVEAT- PT IS INTOXICATED  HPI Comments: ANHAR RABORN is a 45 y.o. female with a PMHx of HTN, DM, and seizures who presents to the Emergency Department here after a fall sustained this evening. Per triage note, pt lost her balance this evening while in a parking lot and hit the back of her head against the pavement. She denies any LOC. Pt now c/o constant, ongoing pain to her head and cold chills. Several episodes of vomiting reported while in triage. Husband states pt has a nerve stimulator in her back currently.   PCP: Tula Nakayama, MD    Past Medical History  Diagnosis Date  . Migraine headache   . Back pain   . Obesity   . Hypertension   . DVT (deep venous thrombosis) ([place]) 2010  . Heart murmur     no cardiologist  . Asthma   . Bronchitis   . Depression   . Diabetes mellitus without complication ([place])   . Obsessive-compulsive disorder   . PTSD (post-traumatic stress disorder)   . Shortness of breath   . GERD (gastroesophageal reflux disease)   . Anemia   . PSYCHOTIC D/O W/HALLUCINATIONS CONDS CLASS ELSW 03/04/2010    Qualifier: Diagnosis of  By: Moshe Cipro MD, Joycelyn Schmid    . Asthma flare 04/09/2013  . Seasonal allergies 12/10/2012  . Chronic abdominal pain   . Chronic constipation   . Diabetes mellitus, type II ([place])   . SBO (small bowel obstruction) ([place]) 08/09/2013  . Helicobacter pylori gastritis 06/11/2013    Colonoscopy Dr. Hilarie Fredrickson  . Seizures [facility])    Past Surgical History  Procedure Laterality Date  . Appendectomy  1986  .  Carpal tunnel release Bilateral   . Tubal ligation  1994  . Lumbar spine surgery  2010    x 3  . Lysis of adhesion  2003    Dr. Irving Shows  . Trigger finger release  2009    right pinkie finger  . Anterior cervical decomp/discectomy fusion  07/07/2012    Procedure: ANTERIOR CERVICAL DECOMPRESSION/DISCECTOMY FUSION 2 LEVELS;  Surgeon: Floyce Stakes, MD;  Location: [facility];  Service: Neurosurgery;  Laterality: N/A;  Cervical four-five, five - six  Anterior cervical decompression/diskectomy/fusion/plate  . Partial hysterectomy  1990s?    [place], [place]  . Spinal cord stimulator implant    . Oophorectomy    . Laparoscopy N/A 07/29/2013    Procedure: diagnostic laporoscopy;  Surgeon: Adin Hector, MD;  Location: [facility] ORS;  Service: General;  Laterality: N/A;  . Lysis of adhesion N/A 07/29/2013    Procedure: LYSIS OF ADHESION;  Surgeon: Adin Hector, MD;  Location: [facility] ORS;  Service: General;  Laterality: N/A;  . Bowel resection N/A 07/29/2013    Procedure: serosal repair;  Surgeon: Adin Hector, MD;  Location: [facility] ORS;  Service: General;  Laterality: N/A;  . Laparoscopy N/A 08/16/2013    Procedure: LAPAROSCOPY DIAGNOSTIC/LYSIS OF ADHESIONS;  Surgeon: Adin Hector, MD;  Location: [facility];  Service: General;  Laterality: N/A;  . Laparotomy N/A 08/16/2013    Procedure: EXPLORATORY LAPAROTOMY/SMALL BOWEL RESECTION (JEJUNUM);  Surgeon: Adin Hector, MD;  Location: [facility];  Service: General;  Laterality: N/A;   Family History  Problem Relation Age of Onset  . Lung cancer Father   . Stomach cancer Father   . Esophageal cancer Father   . Alcohol abuse Father   . Mental illness Father   . Diabetes Sister   . Hypertension Sister   . Bipolar disorder Sister   . Schizophrenia Sister   . Diabetes Sister   . Alcohol abuse Brother   . Hypertension Brother   . Kidney disease Brother   . Diabetes Brother   . Drug abuse Brother   . Mental illness Brother   . Alcohol abuse Brother    . Alcohol abuse Brother   . Hypertension Brother   . Diabetes Brother   . Alcohol abuse Brother   . Physical abuse Mother   . Alcohol abuse Mother   . Mental illness Brother     Deceased  . ADD / ADHD Neg Hx   . Anxiety disorder Neg Hx   . Dementia Neg Hx   . Depression Neg Hx   . OCD Neg Hx   . Seizures Neg Hx   . Paranoid behavior Neg Hx   . Drug abuse Sister   . Alcohol abuse Brother   . Colon cancer Neg Hx    Social History  Substance Use Topics  . Smoking status: Never Smoker   . Smokeless tobacco: Never Used  . Alcohol Use: No   OB History    No data available     Review of Systems  LEVEL 5 CAVEAT- PT IS INTOXICATED  Allergies  Amoxicillin; Neurontin; Penicillins; Pregabalin; Tramadol; and Latex  Home Medications   Prior to Admission medications   Medication Sig Start Date End Date Taking? Authorizing Provider  acetaminophen (TYLENOL) 500 MG tablet Take 1,000 mg by mouth every 6 (six) hours as needed for mild pain.    Historical Provider, MD  albuterol (PROVENTIL) (2.5 MG/3ML) 0.083% nebulizer solution NEBULIZE 1 VIAL EVERY 6 HOURS AS NEEDED FOR WHEEZING 05/30/15   Fayrene Helper, MD  ALPRAZolam Duanne Moron) 1 MG tablet Take 1 tablet (1 mg total) by mouth 3 (three) times daily as needed. 01/25/16   Cloria Spring, MD  azelastine (ASTELIN) 0.1 % nasal spray Place 2 sprays into both nostrils 2 (two) times daily. Use in each nostril as directed 05/01/15   Fayrene Helper, MD  azithromycin [facility]) 250 MG tablet Use as directed Patient not taking: Reported on 01/25/2016 10/23/15   Fayrene Helper, MD  beclomethasone (QVAR) 40 MCG/ACT inhaler Inhale 2 puffs into the lungs 2 (two) times daily. 02/15/15   Fayrene Helper, MD  benzonatate (TESSALON) 100 MG capsule Take 1 capsule (100 mg total) by mouth 2 (two) times daily as needed for cough. 01/15/16   Fayrene Helper, MD  Botulinum Toxin Type A 200 units SOLR Inject 200 Units as directed every 3 (three)  months. 11/28/15   Marcial Pacas, MD  cloNIDine (CATAPRES) 0.1 MG tablet TAKE 1 TABLET BY MOUTH AT BEDTIME FOR HYPERTENSION 11/20/15   Fayrene Helper, MD  diazepam (VALIUM) 5 MG tablet Take 1 tablet (5 mg total) by mouth every 8 (eight) hours as needed for muscle spasms. 01/10/15   Fayrene Helper, MD  diclofenac sodium (VOLTAREN) 1 % GEL Apply 4 g topically  4 (four) times daily. 03/07/15   Historical Provider, MD  escitalopram (LEXAPRO) 20 MG tablet Take 1 tablet (20 mg total) by mouth daily. 01/25/16 01/24/17  Cloria Spring, MD  fluconazole (DIFLUCAN) 150 MG tablet Take 1 tablet (150 mg total) by mouth once. After completing antibiotic if needed 08/07/15   Fayrene Helper, MD  fluticasone [facility]) 50 MCG/ACT nasal spray PLACE 2 SPRAYS INTO BOTH NOSTRILS DAILY. 03/23/15   Fayrene Helper, MD  ipratropium (ATROVENT) 0.02 % nebulizer solution NEBULIZE 1 VIAL EVERY 6 HOURS AS NEEDED FOR WHEEZING 03/23/15   Fayrene Helper, MD  lamoTRIgine (LAMICTAL) 25 MG tablet Take 1 tablet (25 mg total) by mouth daily. 01/25/16 01/24/17  Cloria Spring, MD  Linaclotide [facility]) 290 MCG CAPS capsule Take 1 capsule (290 mcg total) by mouth 2 (two) times daily. 08/14/15   Jerene Bears, MD  lubiprostone (AMITIZA) 24 MCG capsule Take 1 capsule (24 mcg total) by mouth 2 (two) times daily. 01/15/16   Jerene Bears, MD  metroNIDAZOLE (FLAGYL) 500 MG tablet Take 1 tablet (500 mg total) by mouth 2 (two) times daily. 08/07/15   Fayrene Helper, MD  montelukast (SINGULAIR) 10 MG tablet TAKE 1 TABLET (10 MG TOTAL) BY MOUTH AT BEDTIME. 10/16/15   Fayrene Helper, MD  montelukast (SINGULAIR) 10 MG tablet TAKE 1 TABLET (10 MG TOTAL) BY MOUTH AT BEDTIME. 10/23/15   Fayrene Helper, MD  olopatadine (PATANOL) 0.1 % ophthalmic solution PLACE 1 DROP INTO BOTH EYES 2 (TWO) TIMES DAILY. 10/23/15   Fayrene Helper, MD  Oxycodone HCl 10 MG TABS Take 10 mg by mouth every 12 (twelve) hours as needed (pain).  03/29/15   Historical  Provider, MD  pantoprazole (PROTONIX) 40 MG tablet Take 1 tablet by mouth twice daily (pharmacy-please d/c rx for once daily dosing) 01/15/16   Jerene Bears, MD  potassium chloride 20 MEQ/15ML (10%) SOLN TAKE 15MLS BY MOUTH TWICE A DAY 11/20/15   Fayrene Helper, MD  predniSONE (STERAPRED UNI-PAK 21 TAB) 5 MG (21) TBPK tablet Take 1 tablet (5 mg total) by mouth as directed. Use as directed Patient not taking: Reported on 01/25/2016 01/15/16   Fayrene Helper, MD  rizatriptan (MAXALT-MLT) 10 MG disintegrating tablet Take 1 tablet (10 mg total) by mouth as needed. May repeat in 2 hours if needed 12/07/15   Marcial Pacas, MD  spironolactone (ALDACTONE) 25 MG tablet TAKE 1 TABLET BY MOUTH EVERY DAY 01/31/16   Fayrene Helper, MD  spironolactone (ALDACTONE) 25 MG tablet TAKE 1 TABLET BY MOUTH EVERY DAY 02/14/16   Fayrene Helper, MD  sulfamethoxazole-trimethoprim (BACTRIM DS,SEPTRA DS) 800-160 MG tablet Take 1 tablet by mouth 2 (two) times daily. 01/15/16   Fayrene Helper, MD  SYMBICORT 160-4.5 MCG/ACT inhaler USE 2 PUFFS INTO LUNGS TWICE A DAY 10/02/15   Fayrene Helper, MD  triamterene-hydrochlorothiazide (DYAZIDE) 37.5-25 MG capsule TAKE ONE CAPSULE BY MOUTH EVERY DAY 09/18/15   Fayrene Helper, MD  vitamin B-12 (CYANOCOBALAMIN) 1000 MCG tablet Take 1 tablet (1,000 mcg total) by mouth daily. 04/07/15   Jerene Bears, MD  zolpidem (AMBIEN CR) 12.5 MG CR tablet Take 1 tablet (12.5 mg total) by mouth at bedtime as needed for sleep. 12/06/15 12/05/16  Cloria Spring, MD  zolpidem (AMBIEN CR) 12.5 MG CR tablet Take 1 tablet (12.5 mg total) by mouth at bedtime. 01/25/16   Cloria Spring, MD   Triage Vitals: BP 124/74  mmHg  Pulse 92  Temp(Src) 97.4 F (36.3 C) (Oral)  Resp 22  SpO2 97%  LMP 04/16/2015   Physical Exam  Constitutional: She is oriented to person, place, and time. She appears well-developed and well-nourished. No distress.  Clinically intoxicated Dry heaving on exam   HENT:  Head:  Normocephalic.  Nose: Nose normal.  Mouth/Throat: Oropharynx is clear and moist. No oropharyngeal exudate.  Soft tissue hematoma to the crown of head   Eyes: Conjunctivae and EOM are normal. Pupils are equal, round, and reactive to light. No scleral icterus.  Neck: Normal range of motion. Neck supple. No JVD present. No tracheal deviation present. No thyromegaly present.  Cardiovascular: Normal rate, regular rhythm and normal heart sounds.  Exam reveals no gallop and no friction rub.   No murmur heard. Pulmonary/Chest: Effort normal and breath sounds normal. No respiratory distress. She has no wheezes. She exhibits no tenderness.  Abdominal: Soft. Bowel sounds are normal. She exhibits no distension and no mass. There is no tenderness. There is no rebound and no guarding.  Musculoskeletal: Normal range of motion. She exhibits tenderness. She exhibits no edema.  Abrasion to the L elbow with TTP  Lymphadenopathy:    She has no cervical adenopathy.  Neurological: She is alert and oriented to person, place, and time. No cranial nerve deficit. She exhibits normal muscle tone.  Moves all 4 extremities, intermittently follows commands  Skin: Skin is warm and dry. No rash noted. No erythema. No pallor.  Nursing note and vitals reviewed.   ED Course  Procedures (including critical care time)  DIAGNOSTIC STUDIES: Oxygen Saturation is 97% on RA, Normal by my interpretation.    COORDINATION OF CARE: 12:30 AM- Will order blood work. Discussed treatment plan with pt at bedside and pt agreed to plan.     Labs Review Labs Reviewed  BASIC METABOLIC PANEL - Abnormal; Notable for the following:    Potassium 2.4 (*)    CO2 20 (*)    Glucose, Bld 159 (*)    Calcium 8.8 (*)    All other components within normal limits  CBC WITH DIFFERENTIAL/PLATELET - Abnormal; Notable for the following:    HCT 35.8 (*)    All other components within normal limits  ETHANOL - Abnormal; Notable for the following:     Alcohol, Ethyl (B) 181 (*)    All other components within normal limits    Imaging Review Dg Elbow Complete Left  02/18/2016  CLINICAL DATA:  Initial evaluation for acute trauma, fall. EXAM: LEFT ELBOW - COMPLETE 3+ VIEW COMPARISON:  None. FINDINGS: There is no evidence of fracture, dislocation, or joint effusion. Minimal degenerative spurring of the olecranon. Soft tissues are unremarkable. IMPRESSION: No acute osseous abnormality about the elbow. Electronically Signed   By: Jeannine Boga M.D.   On: 02/18/2016 02:29   Ct Head Wo Contrast  02/18/2016  CLINICAL DATA:  Status post fall. Concern for head injury. Initial encounter. EXAM: CT HEAD WITHOUT CONTRAST TECHNIQUE: Contiguous axial images were obtained from the base of the skull through the vertex without intravenous contrast. COMPARISON:  CT of the head performed 01/07/2015 FINDINGS: There is no evidence of acute infarction, mass lesion, or intra- or extra-axial hemorrhage on CT. The posterior fossa, including the cerebellum, brainstem and fourth ventricle, is within normal limits. The third and lateral ventricles, and basal ganglia are unremarkable in appearance. The cerebral hemispheres are symmetric in appearance, with normal gray-white differentiation. No mass effect or midline shift is seen.  There is no evidence of fracture; visualized osseous structures are unremarkable in appearance. The visualized portions of the orbits are within normal limits. The paranasal sinuses and mastoid air cells are well-aerated. No significant soft tissue abnormalities are seen. IMPRESSION: No evidence of traumatic intracranial injury or fracture. Electronically Signed   By: Garald Balding M.D.   On: 02/18/2016 02:22   I have personally reviewed and evaluated these images and lab results as part of my medical decision-making.   EKG Interpretation None      MDM   Final diagnoses:  None   Patient presents to the ED for alcohol intoxication and a  fall.  She has swelling to her head and elbow.  Will obtain imaging.  Tetanus was updated.  She was given IVF, potassium and magnesium replacement.    3:01 AM Imaging is negative for any injury.  She continues to sleep in NAD in the ED.  Her husband is sober and can take her home.  VS remain within her normal limits and she is safe for Dc.   I personally performed the services described in this documentation, which was scribed in my presence. The recorded information has been reviewed and is accurate.     Everlene Balls, MD 02/18/16 [phone]

## 2016-02-18 NOTE — ED Notes (Signed)
2.4 potassium level called in.

## 2016-02-19 ENCOUNTER — Ambulatory Visit (INDEPENDENT_AMBULATORY_CARE_PROVIDER_SITE_OTHER): Payer: 59 | Admitting: Psychiatry

## 2016-02-19 ENCOUNTER — Encounter (HOSPITAL_COMMUNITY): Payer: Self-pay | Admitting: Psychiatry

## 2016-02-19 DIAGNOSIS — F3175 Bipolar disorder, in partial remission, most recent episode depressed: Secondary | ICD-10-CM
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
## 2016-02-19 NOTE — Patient Instructions (Signed)
Discussed orally 

## 2016-02-20 ENCOUNTER — Other Ambulatory Visit: Payer: Self-pay | Admitting: Family Medicine

## 2016-02-22 ENCOUNTER — Telehealth: Payer: Self-pay | Admitting: Family Medicine
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
## 2016-02-23 DIAGNOSIS — M961 Postlaminectomy syndrome, not elsewhere classified: Secondary | ICD-10-CM | POA: Diagnosis not present

## 2016-02-23 DIAGNOSIS — G894 Chronic pain syndrome: Secondary | ICD-10-CM | POA: Diagnosis not present

## 2016-02-23 DIAGNOSIS — M1711 Unilateral primary osteoarthritis, right knee: Secondary | ICD-10-CM | POA: Diagnosis not present

## 2016-02-26 ENCOUNTER — Encounter (HOSPITAL_COMMUNITY): Payer: Self-pay | Admitting: Psychiatry

## 2016-02-26 ENCOUNTER — Ambulatory Visit (INDEPENDENT_AMBULATORY_CARE_PROVIDER_SITE_OTHER): Payer: 59 | Admitting: Psychiatry

## 2016-02-26 VITALS — BP 117/63 | HR 69 | Ht 66.0 in | Wt 172.8 lb

## 2016-02-26 DIAGNOSIS — F4310 Post-traumatic stress disorder, unspecified: Secondary | ICD-10-CM

## 2016-02-26 DIAGNOSIS — F3175 Bipolar disorder, in partial remission, most recent episode depressed: Secondary | ICD-10-CM | POA: Diagnosis not present

## 2016-02-26 MED ORDER — ZOLPIDEM TARTRATE ER 12.5 MG PO TBCR: 12.5000 mg | EXTENDED_RELEASE_TABLET | Freq: Every evening | ORAL | Status: DC | PRN

## 2016-02-26 MED ORDER — ALPRAZOLAM 1 MG PO TABS: 1.0000 mg | ORAL_TABLET | Freq: Three times a day (TID) | ORAL | Status: DC | PRN
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
## 2016-03-01 ENCOUNTER — Telehealth: Payer: Self-pay | Admitting: Neurology

## 2016-03-01 NOTE — Telephone Encounter (Signed)
Called speciality pharmacy to get medication. They stated that the patient insurance was inactive. I called the patient to verify and she stated that her insurance was active and there should not be any problems. I will call the speciality pharmacy back.

## 2016-03-01 NOTE — Telephone Encounter (Signed)
Called the pharmacy back and they stated that the patients pharmacy benefits were inactive and that she would have to call and check on why this was. I called the patient to inform her but received no answer. I left her a VM asking her to call her insurance and also call me back.

## 2016-03-01 NOTE — Telephone Encounter (Signed)
Spoke with patient and she stated that she would call her insurance and see what's going on and then call me back to let me know.

## 2016-03-01 NOTE — Telephone Encounter (Signed)
Called [employer] to obtain PA they were having system issues and they told me i would have to call back. Will call back later today.

## 2016-03-02 ENCOUNTER — Other Ambulatory Visit: Payer: Self-pay | Admitting: Family Medicine

## 2016-03-02 ENCOUNTER — Other Ambulatory Visit: Payer: Self-pay | Admitting: Internal Medicine

## 2016-03-04 ENCOUNTER — Ambulatory Visit (HOSPITAL_COMMUNITY): Payer: Self-pay | Admitting: Psychiatry

## 2016-03-07 ENCOUNTER — Ambulatory Visit: Payer: Medicare HMO | Admitting: Neurology

## 2016-03-11 ENCOUNTER — Other Ambulatory Visit: Payer: Self-pay | Admitting: Internal Medicine

## 2016-03-19 ENCOUNTER — Ambulatory Visit (INDEPENDENT_AMBULATORY_CARE_PROVIDER_SITE_OTHER): Payer: 59 | Admitting: Internal Medicine

## 2016-03-19 ENCOUNTER — Encounter: Payer: Self-pay | Admitting: Internal Medicine

## 2016-03-19 VITALS — BP 108/60 | HR 68 | Ht 66.0 in | Wt 169.0 lb

## 2016-03-19 DIAGNOSIS — K5903 Drug induced constipation: Secondary | ICD-10-CM | POA: Diagnosis not present

## 2016-03-19 DIAGNOSIS — K219 Gastro-esophageal reflux disease without esophagitis: Secondary | ICD-10-CM

## 2016-03-19 DIAGNOSIS — K5900 Constipation, unspecified: Secondary | ICD-10-CM

## 2016-03-19 DIAGNOSIS — K5909 Other constipation: Secondary | ICD-10-CM

## 2016-03-19 DIAGNOSIS — K589 Irritable bowel syndrome without diarrhea: Secondary | ICD-10-CM | POA: Diagnosis not present

## 2016-03-19 DIAGNOSIS — T402X5A Adverse effect of other opioids, initial encounter: Secondary | ICD-10-CM

## 2016-03-19 MED ORDER — METHYLNALTREXONE BROMIDE 150 MG PO TABS: 450.0000 mg | ORAL_TABLET | ORAL | Status: DC

## 2016-03-19 MED ORDER — PANTOPRAZOLE SODIUM 40 MG PO TBEC: 40.0000 mg | DELAYED_RELEASE_TABLET | Freq: Two times a day (BID) | ORAL | Status: DC

## 2016-03-19 NOTE — Telephone Encounter (Signed)
Called the pharmacy to schedule delivery and [facility] stated that they had canceled order due to not being able to reach the patient. Called the patient and informed her of what was going on and she stated that she would call them.

## 2016-03-19 NOTE — Patient Instructions (Signed)
Please discontinue Amitiza and Linzess.  Continue Miralax daily.  We have sent the following medications to your pharmacy for you to pick up at your convenience: Relistor 30 minutes before breakfast  Continue Pantoprazole.  If you are age 47 or older, your body mass index should be between 23-30. Your Body mass index is 27.29 kg/(m^2). If this is out of the aforementioned range listed, please consider follow up with your Primary Care Provider.  If you are age 66 or younger, your body mass index should be between 19-25. Your Body mass index is 27.29 kg/(m^2). If this is out of the aformentioned range listed, please consider follow up with your Primary Care Provider.

## 2016-03-19 NOTE — Progress Notes (Signed)
Subjective:    Patient ID: Jane English, female    DOB: 11-Mar-1971, 45 y.o.   MRN: [id number]  HPI Jane English is a 45 year old female history of chronic pain, chronic constipation, H. pylori duodenitis status post treatment, GERD, multiple abdominal surgeries for lysis of adhesions, migraines, bipolar depression and hypertension who is here for follow-up. She was last seen one year ago. She continues to deal with chronic constipation. She has been taking Amitiza 24 g twice a day and Linzess was added due to ongoing constipation. We slowly increase the dose to 290 g daily and she reports she made the decision to increase this to 290 g twice a day. She states she knows this is more than I have recommended that she was having constipation with lower abdominal tightness and bloating. She reports that the Linzess at 290 g worked great but lost efficacy. She was having a bowel movement daily initially but then every 2-3 days. She's had hard and large stools which she reports has been painful to pass. She's now having a bowel movement every 2-3 days. She's added MiraLAX 17 g daily. She continues to use oxycodone at bedtime for chronic pain. She continues on pantoprazole 40 mg twice daily for her reflux which helps tremendously. She denies dysphagia or odynophagia. No nausea or vomiting. Ongoing fatigue. She denies shortness of breath or chest pain.   Review of Systems As per history of present illness, otherwise negative  Current Medications, Allergies, Past Medical History, Past Surgical History, Family History and Social History were reviewed in [employer] record.     Objective:   Physical Exam BP 108/60 mmHg  Pulse 68  Ht 5\' 6"  (1.676 m)  Wt 169 lb (76.658 kg)  BMI 27.29 kg/m2  LMP 02/12/2016 Constitutional: Well-developed and well-nourished. No distress. HEENT: Normocephalic and atraumatic. Oropharynx is clear and moist. No oropharyngeal exudate. Conjunctivae are  normal.  No scleral icterus. Neck: Neck supple. Trachea midline. Cardiovascular: Normal rate, regular rhythm and intact distal pulses. No M/R/G Pulmonary/chest: Effort normal and breath sounds normal. No wheezing, rales or rhonchi. Abdominal: Soft, nontender, nondistended. Bowel sounds active throughout. Multiple well-healed abdominal scars. Extremities: no clubbing, cyanosis, or edema Neurological: Alert and oriented to person place and time. Skin: Skin is warm and dry. No rashes noted. Psychiatric: Normal mood and affect. Behavior is normal.   CBC    Component Value Date/Time   WBC 8.7 02/17/2016 2323   RBC 3.90 02/17/2016 2323   HGB 12.0 02/17/2016 2323   HCT 35.8* 02/17/2016 2323   PLT 262 02/17/2016 2323   MCV 91.8 02/17/2016 2323   MCH 30.8 02/17/2016 2323   MCHC 33.5 02/17/2016 2323   RDW 12.7 02/17/2016 2323   LYMPHSABS 3.6 02/17/2016 2323   MONOABS 0.7 02/17/2016 2323   EOSABS 0.3 02/17/2016 2323   BASOSABS 0.0 02/17/2016 2323        Assessment & Plan:  45 year old female history of chronic pain, chronic constipation, H. pylori duodenitis status post treatment, GERD, multiple abdominal surgeries for lysis of adhesions, migraines, bipolar depression and hypertension who is here for follow-up.   1. Chronic abdominal pain/chronic constipation/abdominal adhesive disease -- she has chronic irritable bowel related constipation exacerbated by the use of narcotic pain medications. She has been on 2 prescription laxatives and taking more Linzess than I have prescribed. This warrants change in therapy. I'm going to try her on Relistor 450 mg daily. Discontinue Linzess and Amitiza. She will take Relistor 30 minutes  before breakfast with water. He can continue MiraLAX 17 g daily. Call me if ineffective.  2. GERD -- decrease pantoprazole 40 mg once daily. No alarm symptoms at present. GERD is well controlled currently  3. IDA -- anemia has resolved  25 minutes spent with the  patient today. Greater than 50% was spent in counseling and coordination of care with the patient

## 2016-03-20 ENCOUNTER — Telehealth: Payer: Self-pay | Admitting: Internal Medicine

## 2016-03-20 DIAGNOSIS — R69 Illness, unspecified: Secondary | ICD-10-CM | POA: Diagnosis not present

## 2016-03-20 DIAGNOSIS — Z0100 Encounter for examination of eyes and vision without abnormal findings: Secondary | ICD-10-CM | POA: Diagnosis not present

## 2016-03-20 NOTE — Telephone Encounter (Signed)
Form was completed via cover my meds and is awaiting prior authorization response. This call was regarding that form.

## 2016-03-20 NOTE — Telephone Encounter (Signed)
Estill Bamberg from [employer] called in regarding this. Estill Bamberg states that clinical questions need to be answered. Best # [phone] Key code # [id number]

## 2016-03-21 ENCOUNTER — Ambulatory Visit: Payer: 59 | Admitting: Neurology

## 2016-03-21 ENCOUNTER — Telehealth: Payer: Self-pay | Admitting: *Deleted

## 2016-03-21 NOTE — Telephone Encounter (Signed)
[facility] F[facility] has approved patient's Relistor 150 mg tablets from 11/03/15-11/03/16. Referral number, [id number].

## 2016-03-22 ENCOUNTER — Encounter: Payer: Self-pay | Admitting: *Deleted

## 2016-03-22 NOTE — Telephone Encounter (Signed)
[facility], [facility] also [facility] patient's Relistor x 12 months. Reference # [id number]

## 2016-03-25 ENCOUNTER — Ambulatory Visit (INDEPENDENT_AMBULATORY_CARE_PROVIDER_SITE_OTHER): Payer: 59 | Admitting: Family Medicine

## 2016-03-25 ENCOUNTER — Ambulatory Visit (HOSPITAL_COMMUNITY): Payer: Self-pay | Admitting: Psychiatry

## 2016-03-25 ENCOUNTER — Encounter: Payer: Self-pay | Admitting: Family Medicine

## 2016-03-25 VITALS — BP 118/70 | HR 86 | Resp 16 | Ht 66.0 in | Wt 172.0 lb

## 2016-03-25 DIAGNOSIS — R29898 Other symptoms and signs involving the musculoskeletal system: Secondary | ICD-10-CM

## 2016-03-25 DIAGNOSIS — R7303 Prediabetes: Secondary | ICD-10-CM

## 2016-03-25 DIAGNOSIS — E669 Obesity, unspecified: Secondary | ICD-10-CM | POA: Diagnosis not present

## 2016-03-25 DIAGNOSIS — M545 Low back pain: Secondary | ICD-10-CM

## 2016-03-25 DIAGNOSIS — I10 Essential (primary) hypertension: Secondary | ICD-10-CM | POA: Diagnosis not present

## 2016-03-25 DIAGNOSIS — M549 Dorsalgia, unspecified: Secondary | ICD-10-CM

## 2016-03-25 DIAGNOSIS — Z0000 Encounter for general adult medical examination without abnormal findings: Secondary | ICD-10-CM | POA: Diagnosis not present

## 2016-03-25 DIAGNOSIS — Z9181 History of falling: Secondary | ICD-10-CM

## 2016-03-25 MED ORDER — KETOROLAC TROMETHAMINE 60 MG/2ML IM SOLN
60.0000 mg | Freq: Once | INTRAMUSCULAR | Status: AC
  Administered 2016-03-25: 60 mg via INTRAMUSCULAR

## 2016-03-25 MED ORDER — PHENTERMINE HCL 37.5 MG PO TABS: 37.5000 mg | ORAL_TABLET | Freq: Every day | ORAL | Status: DC

## 2016-03-25 MED ORDER — METHYLPREDNISOLONE ACETATE 80 MG/ML IJ SUSP
80.0000 mg | Freq: Once | INTRAMUSCULAR | Status: AC
  Administered 2016-03-25: 80 mg via INTRAMUSCULAR

## 2016-03-25 MED ORDER — FLUCONAZOLE 150 MG PO TABS: ORAL_TABLET | ORAL | Status: DC

## 2016-03-25 MED ORDER — CLOTRIMAZOLE-BETAMETHASONE 1-0.05 % EX CREA: TOPICAL_CREAM | CUTANEOUS | Status: DC

## 2016-03-25 NOTE — Patient Instructions (Addendum)
Annual physical exam Sept 28 or after, call if you need me sooner  Lab today  HALF phentermine daily  Lab today HBA1C , chem 7 and EGFR  You are referred to PT for low back pain with leg weakness  Fall Prevention in the Home  Falls can cause injuries. They can happen to people of all ages. There are many things you can do to make your home safe and to help prevent falls.  WHAT CAN I DO ON THE OUTSIDE OF MY HOME?  Regularly fix the edges of walkways and driveways and fix any cracks.  Remove anything that might make you trip as you walk through a door, such as a raised step or threshold.  Trim any bushes or trees on the path to your home.  Use bright outdoor lighting.  Clear any walking paths of anything that might make someone trip, such as rocks or tools.  Regularly check to see if handrails are loose or broken. Make sure that both sides of any steps have handrails.  Any raised decks and porches should have guardrails on the edges.  Have any leaves, snow, or ice cleared regularly.  Use sand or salt on walking paths during winter.  Clean up any spills in your garage right away. This includes oil or grease spills. WHAT CAN I DO IN THE BATHROOM?   Use night lights.  Install grab bars by the toilet and in the tub and shower. Do not use towel bars as grab bars.  Use non-skid mats or decals in the tub or shower.  If you need to sit down in the shower, use a plastic, non-slip stool.  Keep the floor dry. Clean up any water that spills on the floor as soon as it happens.  Remove soap buildup in the tub or shower regularly.  Attach bath mats securely with double-sided non-slip rug tape.  Do not have throw rugs and other things on the floor that can make you trip. WHAT CAN I DO IN THE BEDROOM?  Use night lights.  Make sure that you have a light by your bed that is easy to reach.  Do not use any sheets or blankets that are too big for your bed. They should not hang down  onto the floor.  Have a firm chair that has side arms. You can use this for support while you get dressed.  Do not have throw rugs and other things on the floor that can make you trip. WHAT CAN I DO IN THE KITCHEN?  Clean up any spills right away.  Avoid walking on wet floors.  Keep items that you use a lot in easy-to-reach places.  If you need to reach something above you, use a strong step stool that has a grab bar.  Keep electrical cords out of the way.  Do not use floor polish or wax that makes floors slippery. If you must use wax, use non-skid floor wax.  Do not have throw rugs and other things on the floor that can make you trip. WHAT CAN I DO WITH MY STAIRS?  Do not leave any items on the stairs.  Make sure that there are handrails on both sides of the stairs and use them. Fix handrails that are broken or loose. Make sure that handrails are as long as the stairways.  Check any carpeting to make sure that it is firmly attached to the stairs. Fix any carpet that is loose or worn.  Avoid having throw rugs  at the top or bottom of the stairs. If you do have throw rugs, attach them to the floor with carpet tape.  Make sure that you have a light switch at the top of the stairs and the bottom of the stairs. If you do not have them, ask someone to add them for you. WHAT ELSE CAN I DO TO HELP PREVENT FALLS?  Wear shoes that:  Do not have high heels.  Have rubber bottoms.  Are comfortable and fit you well.  Are closed at the toe. Do not wear sandals.  If you use a stepladder:  Make sure that it is fully opened. Do not climb a closed stepladder.  Make sure that both sides of the stepladder are locked into place.  Ask someone to hold it for you, if possible.  Clearly mark and make sure that you can see:  Any grab bars or handrails.  First and last steps.  Where the edge of each step is.  Use tools that help you move around (mobility aids) if they are needed. These  include:  Canes.  Walkers.  Scooters.  Crutches.  Turn on the lights when you go into a dark area. Replace any light bulbs as soon as they burn out.  Set up your furniture so you have a clear path. Avoid moving your furniture around.  If any of your floors are uneven, fix them.  If there are any pets around you, be aware of where they are.  Review your medicines with your doctor. Some medicines can make you feel dizzy. This can increase your chance of falling. Ask your doctor what other things that you can do to help prevent falls.   This information is not intended to replace advice given to you by your health care provider. Make sure you discuss any questions you have with your health care  toradol and depo medrol; today  Document Released: 08/17/2009 Document Revised: 03/07/2015 Document Reviewed: 11/25/2014 Elsevier Interactive Patient Education 2016 [place]. Medication sent for rash in groin , and use topical antibiotic ointment to hair bump  Advance Directive Advance directives are the legal documents that allow you to make choices about your health care and medical treatment if you cannot speak for yourself. Advance directives are a way for you to communicate your wishes to family, friends, and health care providers. The specified people can then convey your decisions about end-of-life care to avoid confusion if you should become unable to communicate. Ideally, the process of discussing and writing advance directives should happen over time rather than making decisions all at once. Advance directives can be modified as your situation changes, and you can change your mind at any time, even after you have signed the advance directives. Each state has its own laws regarding advance directives. You may want to check with your health care provider, attorney, or state representative about the law in your state. Below are some examples of advance directives. LIVING WILL A living  will is a set of instructions documenting your wishes about medical care when you cannot care for yourself. It is used if you become:  Terminally ill.  Incapacitated.  Unable to communicate.  Unable to make decisions. Items to consider in your living will include:  The use or non-use of life-sustaining equipment, such as dialysis machines and breathing machines (ventilators).  A do not resuscitate (DNR) order, which is the instruction not to use cardiopulmonary resuscitation (CPR) if breathing or heartbeat stops.  Tube feeding.  Withholding of food and fluids.  Comfort (palliative) care when the goal becomes comfort rather than a cure.  Organ and tissue donation. A living will does not give instructions about distribution of your money and property if you should pass away. It is advisable to seek the expert advice of a lawyer in drawing up a will regarding your possessions. Decisions about taxes, beneficiaries, and asset distribution will be legally binding. This process can relieve your family and friends of any burdens surrounding disputes or questions that may come up about the allocation of your assets. DO NOT RESUSCITATE (DNR) A do not resuscitate (DNR) order is a request to not have CPR in the event that your heart stops beating or you stop breathing. Unless given other instructions, a health care provider will try to help any patient whose heart has stopped or who has stopped breathing.  HEALTH CARE PROXY AND DURABLE POWER OF ATTORNEY FOR HEALTH CARE A health care proxy is a person (agent) appointed to make medical decisions for you if you cannot. Generally, people choose someone they know well and trust to represent their preferences when they can no longer do so. You should be sure to ask this person for agreement to act as your agent. An agent may have to exercise judgment in the event of a medical decision for which your wishes are not known. The durable power of attorney for  health care is the legal document that names your health care proxy. Once written, it should be:  Signed.  Notarized.  Dated.  Copied.  Witnessed.  Incorporated into your medical record. You may also want to appoint someone to manage your financial affairs if you cannot. This is called a durable power of attorney for finances. It is a separate legal document from the durable power of attorney for health care. You may choose the same person or someone different from your health care proxy to act as your agent in financial matters.   This information is not intended to replace advice given to you by your health care provider. Make sure you discuss any questions you have with your health care provider.   Document Released: 01/28/2008 Document Revised: 10/26/2013 Document Reviewed: 03/10/2013 Elsevier Interactive Patient Education [employer].

## 2016-03-25 NOTE — Progress Notes (Signed)
Subjective:    Patient ID: Jane English, female    DOB: Nov 30, 1970, 45 y.o.   MRN: [id number]  HPI Preventive Screening-Counseling & Management   Patient present here today for a Medicare annual wellness visit. Has had 2 recent falls due to lower extremity weakness and c/o chronic back pain Increased stress and depression due to ill health of one of her children followed by psychiatry not suicidal or homicidal C/o weight gain and wants medication to help English appetitie   Current Problems (verified)   Medications Prior to Visit Allergies (verified)   PAST HISTORY  Family History (verified)   Social History Married x 3 years, 4 children, 3 step children, and 1 grandchild.    Risk Factors  Current exercise habits:  Has started exercising walking daily 2.5 miles   Dietary issues discussed: heart healthy diet, limit carbs and sweets and fried foods    Cardiac risk factors:   Depression Screen  (Note: if answer to either of the following is "Yes", a more complete depression screening is indicated)  Treated by psych, not suicidal or homicidal Over the past two weeks, have you felt down, depressed or hopeless? Yes  Over the past two weeks, have you felt little interest or pleasure in doing things? No  Have you lost interest or pleasure in daily life? No  Do you often feel hopeless? Yes  Do you cry easily over simple problems? Yes   Activities of Daily Living  In your present state of health, do you have any difficulty performing the following activities?  Driving?: No Managing money?: No Feeding yourself?:No Getting from bed to chair?:No Climbing a flight of stairs?:yes Preparing food and eating?:No Bathing or showering?:No Getting dressed?:No Getting to the toilet?:No Using the toilet?:No Moving around from place to place?: yes, right leg gave out on her and she fell accidentally in past 1 week  Fall Risk Assessment In the past year have you fallen or had a near  fall?: two falls (intox 1) Are you currently taking any medications that make you dizzy?:No   Hearing Difficulties: No Do you often ask people to speak up or repeat themselves?:No Do you experience ringing or noises in your ears?:No Do you have difficulty understanding soft or whispered voices?:No  Cognitive Testing  Alert? Yes Normal Appearance?Yes  Oriented to person? Yes Place? Yes  Time? Yes  Displays appropriate judgment?Yes  Can read the correct time from a watch face? yes Are you having problems remembering things? sometimes  Advanced Directives have been discussed with the patient?Yes, brochure given  , full code   List the Names of Other Physician/Practitioners you currently use:  Dr Harrington Challenger, (Psych) Dr Malvin Johns (psychology)    Indicate any recent Medical Services you may have received from other than [facility] providers in the past year (date may be approximate).   Assessment:    Annual Wellness Exam   Plan:    .  Medicare Attestation  I have personally reviewed:  The patient's medical and social history  Their use of alcohol, tobacco or illicit drugs  Their current medications and supplements  The patient's functional ability including ADLs,fall risks, home safety risks, cognitive, and hearing and visual impairment  Diet and physical activities  Evidence for depression or mood disorders  The patient's weight, height, BMI, and visual acuity have been recorded in the chart. I have made referrals, counseling, and provided education to the patient based on review of the above and I have provided the patient  with a written personalized care plan for preventive services.      Review of Systems     Objective:   Physical Exam BP 118/70 mmHg  Pulse 86  Resp 16  Ht 5\' 6"  (1.676 m)  Wt 172 lb (78.019 kg)  BMI 27.77 kg/m2  SpO2 94%  LMP 02/12/2016   MS: reduced ROM lumabr spine Grade 5 power in lower extremities       Assessment & Plan:  Initial Medicare annual  wellness visit Annual exam as documented. Counseling done  re healthy lifestyle involving commitment to 150 minutes exercise per week, heart healthy diet, and attaining healthy weight.The importance of adequate sleep also discussed. Regular seat belt use and home safety, is also discussed. Changes in health habits are decided on by the patient with goals and time frames  set for achieving them. Immunization and cancer screening needs are specifically addressed at this visit.   Back pain with radiation Increased back pain, recurrent falls, lower ext weakness, has ahd surgery in the past, refer to PT for eval, and management  Obesity Deteriorated. Patient re-educated about  the importance of commitment to a  minimum of 150 minutes of exercise per week.  The importance of healthy food choices with portion control discussed. Encouraged to start a food diary, count calories and to consider  joining a support group. Sample diet sheets offered. Goals set by the patient for the next several months.   Weight /BMI 03/25/2016 03/19/2016 12/28/2015  WEIGHT 172 lb 169 lb 173 lb  HEIGHT 5\' 6"  5\' 6"  5\' 6"   BMI 27.77 kg/m2 27.29 kg/m2 27.94 kg/m2  Some encounter information is confidential and restricted. Go to Review Flowsheets activity to see all data.    Current exercise per week 150 minutes. \Start half phentermine daily

## 2016-03-25 NOTE — Assessment & Plan Note (Signed)

## 2016-03-26 ENCOUNTER — Telehealth: Payer: Self-pay | Admitting: Neurology

## 2016-03-26 NOTE — Telephone Encounter (Signed)
[facility] is calling about a prior authorization for this patient's medication.

## 2016-03-27 ENCOUNTER — Encounter (HOSPITAL_COMMUNITY): Payer: Self-pay | Admitting: Psychiatry

## 2016-03-27 ENCOUNTER — Ambulatory Visit (INDEPENDENT_AMBULATORY_CARE_PROVIDER_SITE_OTHER): Payer: 59 | Admitting: Psychiatry

## 2016-03-27 ENCOUNTER — Telehealth: Payer: Self-pay | Admitting: Internal Medicine

## 2016-03-27 DIAGNOSIS — F3175 Bipolar disorder, in partial remission, most recent episode depressed: Secondary | ICD-10-CM

## 2016-03-27 NOTE — Patient Instructions (Signed)
Discussed orally 

## 2016-03-27 NOTE — Telephone Encounter (Signed)
Pt states she has been taking the Relistor but has not had a BM since 03/22/16 and reports it was not a "full BM." States the Relistor is making her stomach hurt, states having bad abdominal pain with this and it is not working. Please advise.

## 2016-03-27 NOTE — Telephone Encounter (Signed)
Discontinue Relistor Resume Linzess 290 mcg daily, Amitiza 24 mcg BID, and MiraLax 17g once to twice daily High fiber diet
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
## 2016-03-27 NOTE — Telephone Encounter (Signed)
Noted  

## 2016-03-27 NOTE — Telephone Encounter (Signed)
Spoke with pt and she is aware.

## 2016-03-28 ENCOUNTER — Other Ambulatory Visit: Payer: Self-pay | Admitting: Family Medicine

## 2016-03-30 ENCOUNTER — Other Ambulatory Visit: Payer: Self-pay | Admitting: Family Medicine

## 2016-04-04 ENCOUNTER — Encounter: Payer: Self-pay | Admitting: Family Medicine

## 2016-04-04 DIAGNOSIS — M549 Dorsalgia, unspecified: Secondary | ICD-10-CM | POA: Insufficient documentation

## 2016-04-04 NOTE — Assessment & Plan Note (Signed)
Increased back pain, recurrent falls, lower ext weakness, has ahd surgery in the past, refer to PT for eval, and management

## 2016-04-04 NOTE — Assessment & Plan Note (Signed)
Deteriorated. Patient re-educated about  the importance of commitment to a  minimum of 150 minutes of exercise per week.  The importance of healthy food choices with portion control discussed. Encouraged to start a food diary, count calories and to consider  joining a support group. Sample diet sheets offered. Goals set by the patient for the next several months.   Weight /BMI 03/25/2016 03/19/2016 12/28/2015  WEIGHT 172 lb 169 lb 173 lb  HEIGHT 5\' 6"  5\' 6"  5\' 6"   BMI 27.77 kg/m2 27.29 kg/m2 27.94 kg/m2  Some encounter information is confidential and restricted. Go to Review Flowsheets activity to see all data.    Current exercise per week 150 minutes. \Start half phentermine daily

## 2016-04-05 DIAGNOSIS — M961 Postlaminectomy syndrome, not elsewhere classified: Secondary | ICD-10-CM | POA: Diagnosis not present

## 2016-04-05 DIAGNOSIS — M6250 Muscle wasting and atrophy, not elsewhere classified, unspecified site: Secondary | ICD-10-CM | POA: Diagnosis not present

## 2016-04-05 DIAGNOSIS — G894 Chronic pain syndrome: Secondary | ICD-10-CM | POA: Diagnosis not present

## 2016-04-05 DIAGNOSIS — M1711 Unilateral primary osteoarthritis, right knee: Secondary | ICD-10-CM | POA: Diagnosis not present

## 2016-04-08 ENCOUNTER — Ambulatory Visit (HOSPITAL_COMMUNITY): Payer: Self-pay | Admitting: Psychiatry

## 2016-04-11 ENCOUNTER — Other Ambulatory Visit: Payer: Self-pay

## 2016-04-11 MED ORDER — POTASSIUM CHLORIDE 20 MEQ/15ML (10%) PO SOLN: ORAL | Status: DC

## 2016-04-16 ENCOUNTER — Ambulatory Visit (INDEPENDENT_AMBULATORY_CARE_PROVIDER_SITE_OTHER): Payer: 59 | Admitting: Psychiatry

## 2016-04-16 ENCOUNTER — Encounter (HOSPITAL_COMMUNITY): Payer: Self-pay | Admitting: Psychiatry

## 2016-04-16 VITALS — BP 100/74 | HR 78 | Ht 66.0 in | Wt 168.6 lb

## 2016-04-16 DIAGNOSIS — R7309 Other abnormal glucose: Secondary | ICD-10-CM | POA: Diagnosis not present

## 2016-04-16 DIAGNOSIS — F4310 Post-traumatic stress disorder, unspecified: Secondary | ICD-10-CM

## 2016-04-16 DIAGNOSIS — R7303 Prediabetes: Secondary | ICD-10-CM | POA: Diagnosis not present

## 2016-04-16 DIAGNOSIS — F3175 Bipolar disorder, in partial remission, most recent episode depressed: Secondary | ICD-10-CM

## 2016-04-16 LAB — HEMOGLOBIN A1C
HEMOGLOBIN A1C: 5.9 % — AB (ref <5.7)
MEAN PLASMA GLUCOSE: 123 mg/dL

## 2016-04-16 MED ORDER — ESCITALOPRAM OXALATE 20 MG PO TABS: 20.0000 mg | ORAL_TABLET | Freq: Two times a day (BID) | ORAL | Status: DC

## 2016-04-16 MED ORDER — ALPRAZOLAM 1 MG PO TABS: 1.0000 mg | ORAL_TABLET | Freq: Four times a day (QID) | ORAL | Status: DC

## 2016-04-16 MED ORDER — ZOLPIDEM TARTRATE ER 12.5 MG PO TBCR: 12.5000 mg | EXTENDED_RELEASE_TABLET | Freq: Every evening | ORAL | Status: DC | PRN
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
## 2016-04-17 ENCOUNTER — Other Ambulatory Visit: Payer: Self-pay | Admitting: Internal Medicine

## 2016-04-17 LAB — BASIC METABOLIC PANEL WITH GFR
BUN: 16 mg/dL (ref 7–25)
CHLORIDE: 99 mmol/L (ref 98–110)
CO2: 26 mmol/L (ref 20–31)
Calcium: 9.1 mg/dL (ref 8.6–10.2)
Creat: 0.93 mg/dL (ref 0.50–1.10)
GFR, EST AFRICAN AMERICAN: 85 mL/min (ref >=60)
GFR, EST NON AFRICAN AMERICAN: 73 mL/min (ref >=60)
Glucose, Bld: 91 mg/dL (ref 65–99)
POTASSIUM: 4.4 mmol/L (ref 3.5–5.3)
Sodium: 136 mmol/L (ref 135–146)

## 2016-04-22 ENCOUNTER — Ambulatory Visit (HOSPITAL_COMMUNITY): Payer: 59 | Attending: Family Medicine | Admitting: Physical Therapy

## 2016-04-23 ENCOUNTER — Ambulatory Visit: Payer: 59 | Admitting: Neurology

## 2016-04-24 ENCOUNTER — Telehealth: Payer: Self-pay | Admitting: Family Medicine

## 2016-04-24 NOTE — Telephone Encounter (Signed)
Is asking for the nurse to return her call, she has a couple of questions that she needs to ask, please advise?

## 2016-04-25 ENCOUNTER — Other Ambulatory Visit: Payer: Self-pay | Admitting: Internal Medicine

## 2016-04-25 NOTE — Telephone Encounter (Signed)
NEEDS TO TAKE CURRENT HALF TAB DOSE FOR AT LEAST 6 WEEKS AND THEN COME IN FOR RE ASSESSMENT PRIOR TO CHANGE IN DOSE. ENCOURAGE HER TO FOCUS ON FOOD CHOICE AND PORTION CONTROL, ALSO NO EATING AFTER 7 PM, AND 57 OZ WATER DAILY IF SHE IS LOSING 4 POUNDS IN 4 WEEKS , THAT IS SUFFICIENT AND SHE SHOULD STAY ON SAME DOSE SLOW AND STEADY IS MORE IMPT THAN VERY RAPID AND NON SUSTAINABLE, AND LESS RISKY

## 2016-04-25 NOTE — Telephone Encounter (Signed)
Patient would like to know if she can increase Phentermine to a whole tablet.

## 2016-04-25 NOTE — Telephone Encounter (Signed)
Patient aware.  Will continue on current dose for now.  Will call back with any concerns.

## 2016-04-30 ENCOUNTER — Telehealth: Payer: Self-pay | Admitting: Neurology

## 2016-04-30 NOTE — Telephone Encounter (Signed)
Patient called with Representative Melina Modena from [phone] ext [phone], advises BOTOX needs Prior Auth, please call [facility] Prior Auth Dept [phone]. Please call patient [phone].

## 2016-05-03 NOTE — Telephone Encounter (Signed)
Obtained PA for this patient. Order medication. Spoke with the pharmacy numerous time who informed me they were waiting on patient consent. Today I called the pharmacy to schedule delivery and was notified 3 times that the patient had not called to give consent. I called and spoke with the patient and informed her that she had to call them or we would not have any medication. I called for a fourth time and spoke with a rep who told me that they did finally get a call from the patient and she refused to pay copay on the medication so we will not have any botox for her apt and she has not paid copay on current botox to schedule any delivery.

## 2016-05-06 ENCOUNTER — Telehealth: Payer: Self-pay | Admitting: *Deleted

## 2016-05-06 ENCOUNTER — Telehealth: Payer: Self-pay | Admitting: Neurology

## 2016-05-06 ENCOUNTER — Ambulatory Visit (INDEPENDENT_AMBULATORY_CARE_PROVIDER_SITE_OTHER): Payer: 59 | Admitting: Neurology

## 2016-05-06 DIAGNOSIS — R202 Paresthesia of skin: Secondary | ICD-10-CM | POA: Insufficient documentation

## 2016-05-06 DIAGNOSIS — Z0289 Encounter for other administrative examinations: Secondary | ICD-10-CM

## 2016-05-06 NOTE — Telephone Encounter (Signed)
Patient is returning your call this morning.  Please call.  Thanks!

## 2016-05-06 NOTE — Telephone Encounter (Signed)
Attempted to reach patient this morning to see if she was still coming to her appt today.  I was unable to reach her.  Records showed she confirmed her appointment when she received a reminder call from our office.  Dr. Krista Blue will discuss treatment at this visit.

## 2016-05-06 NOTE — Telephone Encounter (Signed)
No showed follow up/Botox appointment.

## 2016-05-08 ENCOUNTER — Encounter: Payer: Self-pay | Admitting: Neurology

## 2016-05-09 NOTE — Telephone Encounter (Signed)
Returned call to patient - she wanted [place] to know she has spoken w/ [facility] and her Botox should be delivered by 05/14/16.  She said [name] should expect a call to confirm the delivery date.

## 2016-05-09 NOTE — Telephone Encounter (Signed)
Please refer to previous phone note. I did not try to call the patient Clifton Custard did. I was not in the office on this day.

## 2016-05-13 ENCOUNTER — Telehealth: Payer: Self-pay | Admitting: Neurology

## 2016-05-13 ENCOUNTER — Ambulatory Visit (HOSPITAL_COMMUNITY): Payer: Self-pay | Admitting: Psychiatry

## 2016-05-13 NOTE — Telephone Encounter (Signed)
Please refer to previous phone notes.

## 2016-05-13 NOTE — Telephone Encounter (Signed)
Pt called in to r/s botox.

## 2016-05-16 ENCOUNTER — Ambulatory Visit (INDEPENDENT_AMBULATORY_CARE_PROVIDER_SITE_OTHER): Payer: 59 | Admitting: Neurology

## 2016-05-16 ENCOUNTER — Encounter: Payer: Self-pay | Admitting: *Deleted

## 2016-05-16 ENCOUNTER — Encounter: Payer: Self-pay | Admitting: Neurology

## 2016-05-16 VITALS — BP 111/75 | HR 76 | Ht 66.0 in | Wt 171.5 lb

## 2016-05-16 DIAGNOSIS — G43719 Chronic migraine without aura, intractable, without status migrainosus: Secondary | ICD-10-CM | POA: Diagnosis not present

## 2016-05-16 MED ORDER — TOPIRAMATE 50 MG PO TABS: 100.0000 mg | ORAL_TABLET | Freq: Two times a day (BID) | ORAL | Status: DC

## 2016-05-16 MED ORDER — RIZATRIPTAN BENZOATE 10 MG PO TBDP: 10.0000 mg | ORAL_TABLET | ORAL | Status: DC | PRN

## 2016-05-16 MED ORDER — SUMATRIPTAN SUCCINATE 6 MG/0.5ML ~~LOC~~ SOLN: 6.0000 mg | SUBCUTANEOUS | Status: DC | PRN
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
## 2016-05-16 NOTE — Progress Notes (Signed)
**  Botox 100 units x 2 vials. Lot [id number], Exp 11/2018, [place] [id number], specialty [email or web address],rn.

## 2016-05-20 ENCOUNTER — Encounter (HOSPITAL_COMMUNITY): Payer: Self-pay | Admitting: Psychiatry

## 2016-05-20 ENCOUNTER — Ambulatory Visit (INDEPENDENT_AMBULATORY_CARE_PROVIDER_SITE_OTHER): Payer: 59 | Admitting: Psychiatry

## 2016-05-20 DIAGNOSIS — F3175 Bipolar disorder, in partial remission, most recent episode depressed: Secondary | ICD-10-CM | POA: Diagnosis not present

## 2016-05-20 NOTE — Patient Instructions (Signed)
Discussed orally 
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
## 2016-05-21 ENCOUNTER — Telehealth (HOSPITAL_COMMUNITY): Payer: Self-pay | Admitting: *Deleted

## 2016-05-22 ENCOUNTER — Encounter (HOSPITAL_COMMUNITY): Payer: Self-pay | Admitting: Psychiatry

## 2016-05-22 ENCOUNTER — Ambulatory Visit (INDEPENDENT_AMBULATORY_CARE_PROVIDER_SITE_OTHER): Payer: 59 | Admitting: Psychiatry

## 2016-05-22 VITALS — BP 108/64 | Ht 66.0 in | Wt 166.0 lb

## 2016-05-22 DIAGNOSIS — F3175 Bipolar disorder, in partial remission, most recent episode depressed: Secondary | ICD-10-CM

## 2016-05-22 MED ORDER — ESCITALOPRAM OXALATE 20 MG PO TABS: 20.0000 mg | ORAL_TABLET | Freq: Two times a day (BID) | ORAL | Status: DC
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
## 2016-05-28 ENCOUNTER — Ambulatory Visit (HOSPITAL_COMMUNITY): Payer: Self-pay | Admitting: Psychiatry

## 2016-05-30 ENCOUNTER — Telehealth (HOSPITAL_COMMUNITY): Payer: Self-pay | Admitting: *Deleted

## 2016-05-30 NOTE — Telephone Encounter (Signed)
Pt walked into office stating she would like for Dr. Harrington Challenger to fill out form for her to keep her dogs at her apartment. Per pt, she have 3 Yorkes and they are her everything. Per pt, she paid $600.00 the first time they moved into this apartment and now she can not find receipt and they are asking her to pay another $600 to keep the dogs. Per pt, she do not have money to pay and told them what type of medications and diagnosis she have and they stated her doctor have to fill out a form. Pt stated she took the form to Dr. Moshe Cipro but she informed her that her psych doctor had to fill out the form. Per pt, the form is called the 549 verifying that her dogs are her companions and this will waive the $600 fee.

## 2016-05-30 NOTE — Telephone Encounter (Signed)
Per conversation with Beatriz Stallion, please forward copy of form to risk management to see if this is appropriate for [place] to fill out

## 2016-06-03 ENCOUNTER — Encounter (INDEPENDENT_AMBULATORY_CARE_PROVIDER_SITE_OTHER): Payer: Self-pay

## 2016-06-03 ENCOUNTER — Ambulatory Visit (INDEPENDENT_AMBULATORY_CARE_PROVIDER_SITE_OTHER): Payer: 59 | Admitting: Psychiatry

## 2016-06-03 ENCOUNTER — Telehealth: Payer: Self-pay

## 2016-06-03 ENCOUNTER — Encounter (HOSPITAL_COMMUNITY): Payer: Self-pay | Admitting: Psychiatry

## 2016-06-03 DIAGNOSIS — F3175 Bipolar disorder, in partial remission, most recent episode depressed: Secondary | ICD-10-CM | POA: Diagnosis not present

## 2016-06-03 MED ORDER — FIRST-DUKES MOUTHWASH MT SUSP: 10.0000 mL | Freq: Three times a day (TID) | OROMUCOSAL | 0 refills | Status: DC | PRN

## 2016-06-03 NOTE — Telephone Encounter (Signed)
pls send dukes mouthwash 10 cc tid gargle swish and swallow x 300 cc no refill and let her know
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
## 2016-06-03 NOTE — Telephone Encounter (Signed)
Message was sent to risk management. An e-mail was sent out. Provider filled out form and handed it to pt and staff made copy of form to send to scan center. Pt showed understanding and requested a copy of form that was completed and handed to her for herself and staff made copy for her. Pt left office with both original copy that provider handed to her and a copy for herself and third copy being sent to scan center.

## 2016-06-03 NOTE — Telephone Encounter (Signed)
Medication sent to pharmacy  

## 2016-06-04 ENCOUNTER — Telehealth (HOSPITAL_COMMUNITY): Payer: Self-pay | Admitting: *Deleted

## 2016-06-04 ENCOUNTER — Other Ambulatory Visit: Payer: Self-pay

## 2016-06-04 ENCOUNTER — Ambulatory Visit (HOSPITAL_COMMUNITY): Payer: Self-pay | Admitting: Psychiatry

## 2016-06-04 NOTE — Telephone Encounter (Signed)
Pt called back today stating when she looked at the form Dr. Harrington Challenger filled out yesterday, she noticed Dr. Harrington Challenger did not fill out the back portion about her dogs. Per pt, she needed Dr. Harrington Challenger to put on the back portion, where it says animals, she needed Dr. Harrington Challenger to put that pt have 3 yorkies and they are her companions and there names are cupcake 3lbs, lexis cookie 2lbs and scrappy 5lbs. Per pt, she will be bring the form back to office for Dr. Harrington Challenger to fill out. Pt number [phone].

## 2016-06-05 NOTE — Telephone Encounter (Signed)
ok 

## 2016-06-06 NOTE — Telephone Encounter (Signed)
You may give verbal permission, I will fill out form next week

## 2016-06-06 NOTE — Telephone Encounter (Signed)
Pt called stating she is currently sitting in her landlords office. Per pt, she was instructed by her landlord that due to provider not filing out the back portion about her pets being her companions, her landlord is okay with a over the phone verbal to check the box for pt having 3 dogs and the dogs are her companions. Informed pt that provider was not in office and would return on Aug 7th but message will be sent to provider. Informed pt if she still would like for Dr. Harrington Challenger to fill out the back of the form, she would need to bring form to office due to office needing to make a copy of the back if provider does fill out the back. Pt verbalized understanding.

## 2016-06-08 ENCOUNTER — Other Ambulatory Visit: Payer: Self-pay | Admitting: Family Medicine

## 2016-06-10 ENCOUNTER — Other Ambulatory Visit: Payer: Self-pay | Admitting: Family Medicine

## 2016-06-10 ENCOUNTER — Telehealth: Payer: Self-pay | Admitting: Family Medicine

## 2016-06-10 ENCOUNTER — Telehealth: Payer: Self-pay | Admitting: Neurology

## 2016-06-10 MED ORDER — TERBINAFINE HCL 250 MG PO TABS: 250.0000 mg | ORAL_TABLET | Freq: Every day | ORAL | 1 refills | Status: DC

## 2016-06-10 NOTE — Telephone Encounter (Signed)
Terbinafine is sent in. There is med available for dry mouth, but dryness is generalized I recommend discussing with neurologist

## 2016-06-10 NOTE — Telephone Encounter (Signed)
Patient states that she has extreme dry mouth and general dryness all over.  Is unsure if it is from one of the new medications started for migraine management or not.  Is also asking for prescription med for nail fungus.  States that she has tried otc topical fungal med for great toe nails without relief.  Please advise.

## 2016-06-10 NOTE — Telephone Encounter (Signed)
Jane English Is asking for a returned call regarding some of her medications, please advise?

## 2016-06-10 NOTE — Telephone Encounter (Signed)
Pt called said she is taking 3 medications and one of them is causing her to have dry mouth. She is wanting to know which one that would be.  When pt called earlier she asked for Danielle and did not go into detail. When she called back she said she had been waiting for Danielle to call her regarding the dry mouth. I explained [name] would not handle this but the RN would. Pt said she asked for Danielle bc she always handles things with her botox.  Pt does not know the names of the medications, she said she is not at home now and is not available to see the medication bottles. She is "concerned" as she would like this taken care of today.

## 2016-06-10 NOTE — Telephone Encounter (Signed)
Pt called request to speak with [name]

## 2016-06-10 NOTE — Telephone Encounter (Signed)
Patient aware.  Will contact neurologist.

## 2016-06-11 ENCOUNTER — Telehealth (HOSPITAL_COMMUNITY): Payer: Self-pay | Admitting: *Deleted

## 2016-06-11 NOTE — Telephone Encounter (Signed)
Pt came into office to drop off form she needed Dr. Harrington Challenger to fill out for her apartment complex so she could keep her dogs. Per pt, she would like to Dr. Harrington Challenger to fill out the second to last page due to her not filling it out the last time she brought form into office. Provider filled out form and office called pt to inform her that form was ready for pick up. Pt verbalized understanding and will pick for up 06-12-16. Office made copy of form again to send to scan center to be scanned into pt's chart.

## 2016-06-11 NOTE — Telephone Encounter (Signed)
Spoke to patient - we reviewed the medications given to her by Dr. Krista Blue and the dosages.  She has been on Maxalt and Topamax for some time now without side effects.  She has not used the sumatriptan that was recently prescribed to her.  She did tell me she has recently been treated for thrush and I told her she may need to be re-examined. She is going to contact her PCP for an appointment.  She will also ask them to go over her extensive medication list to see if another drug may be causing the problem.

## 2016-06-11 NOTE — Telephone Encounter (Signed)
Called pt due to previous calls. Per pt she will call office back when she is with her landlord to so office can give that verbal permission per Dr. Harrington Challenger. Also informed pt that she would still need to come by office to drop off form to office so Dr. Harrington Challenger could fill out as well and send a copy to scan center. Pt verbalized understanding and agreed.

## 2016-06-11 NOTE — Telephone Encounter (Signed)
I spent 11 minutes on the phone with this patient. Pt was very scattered in her thoughts; it was difficult for me to follow what her questions/concerns are.  Pt reports that she has been experiencing dry mouth and dry skin after starting the medications that Dr. Krista Blue has prescribed her for migraines. I found that Dr. Krista Blue has prescribed maxalt, topamax, and imitrex injection for her migraines. Pt is insistent that one of these medications is causing her to have dry mouth. She says that she had thrush and is taking thrush medication. She reports that she is using biotene toothpaste to help with her dry mouth, but it does not work. I advised her that maxalt does have a known side effect of dry mouth and asked her if she had been taking the maxalt. She says that she has been and reports "I have been taking that drug for years, I know what that does to me." She says that she has "backed off taking the topamax" and says that she has "only been taking it during the day and at night". I advised her that per Dr. Rhea Belton instructions, she should be taking 2 tablets by mouth at two times a day. Pt says, "yes, I am doing that. I thought I was messing it up." Pt then goes on to say that the phone staff "hurt my feelings yesterday because they told me that I was on a lot of medication and it would be hard to figure out which one is causing my dry mouth". She goes on to explain to me that she "almost died" in February 07, 2013 because she had a bowel surgery and that is why she is taking a lot of medications for her bowels. I redirected the pt to her complaint of dry mouth. She says that she thinks that her medications are causing her dry mouth and dry skin but cannot tell me when this started. At this point, I offered pt an appt with an NP here at [facility] to review her medication list with her and discuss possible side effects because it would take me too long to do over the phone. Pt says she is agreeable to this, as long as Dr. Krista Blue is ok with  it. I advised pt that I would send this message to Dr. Rhea Belton nurse because Dr. Krista Blue is out of the office, and Dr. Rhea Belton nurse is more familiar with her history than I am, and maybe could recommend something else. Pt says "I am not crazy, do not think that I am crazy, I just think my medications are causing me too be too sleepy." I again reiterated that I would send this request to Dr. Rhea Belton nurse.

## 2016-06-13 ENCOUNTER — Telehealth (HOSPITAL_COMMUNITY): Payer: Self-pay | Admitting: *Deleted

## 2016-06-13 NOTE — Telephone Encounter (Signed)
Pt came by office to pick up her Form that Dr. Harrington Challenger completed. Pt D/L number is [id number] with expiration date of 02-16-17. Pt verbalized understanding and request that office make a copy for her records and office did

## 2016-06-17 ENCOUNTER — Ambulatory Visit (INDEPENDENT_AMBULATORY_CARE_PROVIDER_SITE_OTHER): Payer: 59 | Admitting: Psychiatry

## 2016-06-17 ENCOUNTER — Encounter (HOSPITAL_COMMUNITY): Payer: Self-pay | Admitting: Psychiatry

## 2016-06-17 DIAGNOSIS — F3175 Bipolar disorder, in partial remission, most recent episode depressed: Secondary | ICD-10-CM | POA: Diagnosis not present
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
## 2016-06-22 ENCOUNTER — Other Ambulatory Visit: Payer: Self-pay | Admitting: Family Medicine

## 2016-07-01 ENCOUNTER — Encounter (HOSPITAL_COMMUNITY): Payer: Self-pay | Admitting: Psychiatry

## 2016-07-01 ENCOUNTER — Ambulatory Visit (INDEPENDENT_AMBULATORY_CARE_PROVIDER_SITE_OTHER): Payer: 59 | Admitting: Psychiatry

## 2016-07-01 DIAGNOSIS — F3175 Bipolar disorder, in partial remission, most recent episode depressed: Secondary | ICD-10-CM | POA: Diagnosis not present
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
## 2016-07-17 ENCOUNTER — Encounter (HOSPITAL_COMMUNITY): Payer: Self-pay | Admitting: Psychiatry

## 2016-07-17 ENCOUNTER — Ambulatory Visit (INDEPENDENT_AMBULATORY_CARE_PROVIDER_SITE_OTHER): Payer: 59 | Admitting: Psychiatry

## 2016-07-17 DIAGNOSIS — F3175 Bipolar disorder, in partial remission, most recent episode depressed: Secondary | ICD-10-CM | POA: Diagnosis not present
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
## 2016-07-24 ENCOUNTER — Ambulatory Visit (INDEPENDENT_AMBULATORY_CARE_PROVIDER_SITE_OTHER): Payer: 59 | Admitting: Family Medicine

## 2016-07-24 ENCOUNTER — Encounter: Payer: Self-pay | Admitting: Family Medicine

## 2016-07-24 VITALS — BP 118/72 | HR 82 | Resp 16 | Ht 66.0 in | Wt 162.0 lb

## 2016-07-24 DIAGNOSIS — Z23 Encounter for immunization: Secondary | ICD-10-CM | POA: Diagnosis not present

## 2016-07-24 DIAGNOSIS — R7303 Prediabetes: Secondary | ICD-10-CM | POA: Diagnosis not present

## 2016-07-24 DIAGNOSIS — N3001 Acute cystitis with hematuria: Secondary | ICD-10-CM

## 2016-07-24 DIAGNOSIS — I10 Essential (primary) hypertension: Secondary | ICD-10-CM

## 2016-07-24 DIAGNOSIS — E785 Hyperlipidemia, unspecified: Secondary | ICD-10-CM | POA: Diagnosis not present

## 2016-07-24 LAB — POCT URINALYSIS DIPSTICK
BILIRUBIN UA: NEGATIVE
GLUCOSE UA: NEGATIVE
Ketones, UA: NEGATIVE
NITRITE UA: POSITIVE
PH UA: 5.5
Protein, UA: NEGATIVE
Spec Grav, UA: 1.02
Urobilinogen, UA: 0.2

## 2016-07-24 MED ORDER — CIPROFLOXACIN HCL 500 MG PO TABS: 500.0000 mg | ORAL_TABLET | Freq: Two times a day (BID) | ORAL | 0 refills | Status: DC

## 2016-07-24 NOTE — Patient Instructions (Signed)
Physical exam week of Oct 9  or 16, 2017   You are treated for bladder infection  3 days of cipro sent to pharmacy for treatment  Flu vaccine today  Fasting lipid, chem 7, HBa1C and TSH 3 to 5 days before  Thanks for choosing  Primary Care, we consider it a privelige to serve you.

## 2016-07-25 ENCOUNTER — Other Ambulatory Visit (HOSPITAL_COMMUNITY)
Admission: RE | Admit: 2016-07-25 | Discharge: 2016-07-25 | Disposition: A | Discharge status: Home / Self Care | Payer: 59 | Source: Other Acute Inpatient Hospital | Attending: Family Medicine | Admitting: Family Medicine

## 2016-07-25 DIAGNOSIS — N3001 Acute cystitis with hematuria: Secondary | ICD-10-CM | POA: Diagnosis present

## 2016-07-25 NOTE — Assessment & Plan Note (Signed)
Controlled, no change in medication DASH diet and commitment to daily physical activity for a minimum of 30 minutes discussed and encouraged, as a part of hypertension management. The importance of attaining a healthy weight is also discussed.  BP/Weight 07/24/2016 05/16/2016 03/25/2016 03/19/2016 02/18/2016 [id number] [id number]  Systolic BP 123456 [id number] 123456 [id number]  Diastolic BP 72 75 70 60 78 60 70  Wt. (Lbs) 162 171.5 172 169 - 173 168  BMI 26.15 27.69 27.77 27.29 - 27.94 27.13  Some encounter information is confidential and restricted. Go to Review Flowsheets activity to see all data.

## 2016-07-25 NOTE — Assessment & Plan Note (Signed)
Symptomatic x 3 days with abnormnal uA , treat presumptively with cipro and f/u c/s

## 2016-07-25 NOTE — Progress Notes (Signed)
   Jane English     MRN: [id number]      DOB: [birth year]   HPI [name] is here with 3 day h/o malodorous urine and lower  abdominal pain, denies fever , chills or flank pain ROS Denies recent fever or chills. Denies sinus pressure, nasal congestion, ear pain or sore throat. Denies chest congestion, productive cough or wheezing.   PE  BP 118/72   Pulse 82   Resp 16   Ht 5\' 6"  (1.676 m)   Wt 162 lb (73.5 kg)   SpO2 97%   BMI 26.15 kg/m   Patient alert and oriented and in no cardiopulmonary distress.  HEENT: No facial asymmetry, EOMI,   oropharynx pink and moist.  Neck supple no JVD, no mass.  Chest: Clear to auscultation bilaterally.  CVS: S1, S2 no murmurs, no S3.Regular rate.  ABD: Soft mild suprapubic tenderness, no renal angle tenderness.   Ext: No edema     Assessment & Plan  Acute cystitis Symptomatic x 3 days with abnormnal uA , treat presumptively with cipro and f/u c/s  Essential hypertension Controlled, no change in medication DASH diet and commitment to daily physical activity for a minimum of 30 minutes discussed and encouraged, as a part of hypertension management. The importance of attaining a healthy weight is also discussed.  BP/Weight 07/24/2016 05/16/2016 03/25/2016 03/19/2016 02/18/2016 Q000111Q [id number]  Systolic BP 123456 [id number] 123456 [id number]  Diastolic BP 72 75 70 60 78 60 70  Wt. (Lbs) 162 171.5 172 169 - 173 168  BMI 26.15 27.69 27.77 27.29 - 27.94 27.13  Some encounter information is confidential and restricted. Go to Review Flowsheets activity to see all data.

## 2016-07-29 LAB — URINE CULTURE: Culture: >=100000 — AB

## 2016-07-30 ENCOUNTER — Telehealth: Payer: Self-pay

## 2016-07-30 ENCOUNTER — Other Ambulatory Visit: Payer: Self-pay | Admitting: Family Medicine

## 2016-07-30 MED ORDER — CIPROFLOXACIN HCL 500 MG PO TABS: 500.0000 mg | ORAL_TABLET | Freq: Two times a day (BID) | ORAL | 0 refills | Status: DC

## 2016-07-30 NOTE — Telephone Encounter (Signed)
Yes.  May have cipro 500 BID , #14

## 2016-07-30 NOTE — Telephone Encounter (Signed)
Medication sent.

## 2016-07-31 ENCOUNTER — Encounter (HOSPITAL_COMMUNITY): Payer: Self-pay | Admitting: Psychiatry

## 2016-07-31 ENCOUNTER — Other Ambulatory Visit (HOSPITAL_COMMUNITY): Payer: Self-pay | Admitting: Psychiatry

## 2016-07-31 ENCOUNTER — Other Ambulatory Visit: Payer: Self-pay

## 2016-07-31 ENCOUNTER — Telehealth (HOSPITAL_COMMUNITY): Payer: Self-pay | Admitting: *Deleted

## 2016-07-31 ENCOUNTER — Other Ambulatory Visit: Payer: Self-pay | Admitting: *Deleted

## 2016-07-31 ENCOUNTER — Ambulatory Visit (INDEPENDENT_AMBULATORY_CARE_PROVIDER_SITE_OTHER): Payer: 59 | Admitting: Psychiatry

## 2016-07-31 DIAGNOSIS — F3175 Bipolar disorder, in partial remission, most recent episode depressed: Secondary | ICD-10-CM

## 2016-07-31 MED ORDER — ESCITALOPRAM OXALATE 20 MG PO TABS: 20.0000 mg | ORAL_TABLET | Freq: Two times a day (BID) | ORAL | 2 refills | Status: DC

## 2016-07-31 MED ORDER — CLONIDINE HCL 0.1 MG PO TABS: ORAL_TABLET | ORAL | 1 refills | Status: DC

## 2016-07-31 MED ORDER — PANTOPRAZOLE SODIUM 40 MG PO TBEC: 40.0000 mg | DELAYED_RELEASE_TABLET | Freq: Every day | ORAL | 1 refills | Status: DC

## 2016-07-31 MED ORDER — TRIAMTERENE-HCTZ 37.5-25 MG PO CAPS: 1.0000 | ORAL_CAPSULE | Freq: Every day | ORAL | 1 refills | Status: DC

## 2016-07-31 NOTE — Telephone Encounter (Signed)
Pt pharmacy requesting 90 days supply for pt Escitalopram 20 mg BID. Pt medication last filled on 05-22-2016 with 60 tablets 2 refills. Pt f/u appt is scheduled for 08-01-2016. Pt pharmacy number is [phone].
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
## 2016-07-31 NOTE — Telephone Encounter (Signed)
noted 

## 2016-07-31 NOTE — Telephone Encounter (Signed)
sent 

## 2016-08-01 ENCOUNTER — Encounter (HOSPITAL_COMMUNITY): Payer: Self-pay | Admitting: Psychiatry

## 2016-08-01 ENCOUNTER — Encounter: Payer: Self-pay | Admitting: Family Medicine

## 2016-08-01 ENCOUNTER — Ambulatory Visit (INDEPENDENT_AMBULATORY_CARE_PROVIDER_SITE_OTHER): Payer: 59 | Admitting: Psychiatry

## 2016-08-01 ENCOUNTER — Telehealth (HOSPITAL_COMMUNITY): Payer: Self-pay | Admitting: *Deleted

## 2016-08-01 VITALS — BP 105/65 | HR 82 | Ht 66.0 in | Wt 158.2 lb

## 2016-08-01 DIAGNOSIS — F3175 Bipolar disorder, in partial remission, most recent episode depressed: Secondary | ICD-10-CM

## 2016-08-01 MED ORDER — ALPRAZOLAM 1 MG PO TABS: 1.0000 mg | ORAL_TABLET | Freq: Four times a day (QID) | ORAL | 2 refills | Status: DC

## 2016-08-01 MED ORDER — TRAZODONE HCL 50 MG PO TABS: 50.0000 mg | ORAL_TABLET | Freq: Every day | ORAL | 2 refills | Status: DC

## 2016-08-01 MED ORDER — ESCITALOPRAM OXALATE 20 MG PO TABS: 20.0000 mg | ORAL_TABLET | Freq: Two times a day (BID) | ORAL | 2 refills | Status: DC

## 2016-08-01 NOTE — Telephone Encounter (Signed)
Per pt, she would like to sch her therapy appt 1 month from now. Per pt, she have already discussed what is going on in her life right now. Per pt, her marriage with her husband and her health. Per pt, she's just going to see her son Vincente Liberty to get herself together.
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
## 2016-08-01 NOTE — Telephone Encounter (Signed)
Noted. Schedule for one month.

## 2016-08-02 ENCOUNTER — Encounter: Payer: Self-pay | Admitting: Family Medicine

## 2016-08-02 ENCOUNTER — Telehealth: Payer: Self-pay | Admitting: Internal Medicine

## 2016-08-02 NOTE — Telephone Encounter (Signed)
scheduled

## 2016-08-02 NOTE — Telephone Encounter (Signed)
Pt states she is taking her medicine but is still unable to have a bowel movement. Pt is afraid her bowels are "locked up." Pt requesting to be seen asap. Pt scheduled to see Dr. Hilarie Fredrickson 08/05/16@9 :30am. Pt aware of appt.

## 2016-08-05 ENCOUNTER — Encounter: Payer: Self-pay | Admitting: Internal Medicine

## 2016-08-05 ENCOUNTER — Ambulatory Visit (INDEPENDENT_AMBULATORY_CARE_PROVIDER_SITE_OTHER): Payer: 59 | Admitting: Internal Medicine

## 2016-08-05 VITALS — BP 100/60 | HR 93 | Ht 66.0 in | Wt 160.2 lb

## 2016-08-05 DIAGNOSIS — K5900 Constipation, unspecified: Secondary | ICD-10-CM

## 2016-08-05 DIAGNOSIS — K219 Gastro-esophageal reflux disease without esophagitis: Secondary | ICD-10-CM | POA: Diagnosis not present

## 2016-08-05 DIAGNOSIS — K581 Irritable bowel syndrome with constipation: Secondary | ICD-10-CM | POA: Diagnosis not present

## 2016-08-05 MED ORDER — LINACLOTIDE 290 MCG PO CAPS: 290.0000 ug | ORAL_CAPSULE | Freq: Every day | ORAL | 4 refills | Status: DC

## 2016-08-05 MED ORDER — LUBIPROSTONE 24 MCG PO CAPS: ORAL_CAPSULE | ORAL | 4 refills | Status: DC

## 2016-08-05 NOTE — Progress Notes (Signed)
Subjective:    Patient ID: Jane English, female    DOB: Nov 16, 1970, 45 y.o.   MRN: [id number]  HPI Jane English is a 45 year old female with history of chronic constipation, IBS, chronic pain, GERD, H. pylori status post treatment, multiple abdominal surgeries for abdominal adhesive disease, migraines, bipolar depression and hypertension who is here for follow-up. She was last seen in May 2017. She reports that she continues to have issues with constipation, abdominal bloating and tightness. She feels like her GERD symptoms worsen when she is severely constipated. Most recently she's been using Linzess 290 g daily, Amitiza 24 g twice daily and MiraLAX 17 g twice daily. With this she still has issues with constipation. She reports she'll have frequent flatulence and incomplete bowel movements. She describes her stools as "little worms". At times she will have larger bowel movements which can be uncontrollable. She will wear depends at times. She tried Relistor with without benefit. She is using oxycodone for chronic pain under direction of pain clinic. This is at most twice a day but often she skips days. She reports she is having issues with her depression and also significant marital stress. She denies SI and HI. She is no longer taking Ambien and has recently had medication changes to manage her depression and insomnia. She denies blood in her stool and melena. No dysphagia or odynophagia.  Review of Systems As per history of present illness, otherwise negative  Current Medications, Allergies, Past Medical History, Past Surgical History, Family History and Social History were reviewed in [employer] record.     Objective:   Physical Exam BP 100/60   Pulse 93   Ht 5\' 6"  (1.676 m)   Wt 160 lb 4 oz (72.7 kg)   SpO2 98%   BMI 25.87 kg/m  Constitutional: Well-developed and well-nourished. No distress. HEENT: Normocephalic and atraumatic. Oropharynx is clear and moist.  No oropharyngeal exudate. Conjunctivae are normal.  No scleral icterus. Neck: Neck supple. Trachea midline. Cardiovascular: Normal rate, regular rhythm and intact distal pulses. No M/R/G Pulmonary/chest: Effort normal and breath sounds normal. No wheezing, rales or rhonchi. Abdominal: Soft, nontender, nondistended. Bowel sounds active throughout. Well-healed abdominal incisions Extremities: no clubbing, cyanosis, or edema Neurological: Alert and oriented to person place and time. Skin: Skin is warm and dry. No rashes noted. Psychiatric: Normal mood and affect. Behavior is normal.  CBC    Component Value Date/Time   WBC 8.7 02/17/2016 2323   RBC 3.90 02/17/2016 2323   HGB 12.0 02/17/2016 2323   HCT 35.8 (L) 02/17/2016 2323   PLT 262 02/17/2016 2323   MCV 91.8 02/17/2016 2323   MCH 30.8 02/17/2016 2323   MCHC 33.5 02/17/2016 2323   RDW 12.7 02/17/2016 2323   LYMPHSABS 3.6 02/17/2016 2323   MONOABS 0.7 02/17/2016 2323   EOSABS 0.3 02/17/2016 2323   BASOSABS 0.0 02/17/2016 2323       Assessment & Plan:   45 year old female with history of chronic constipation, IBS, chronic pain, GERD, H. pylori status post treatment, multiple abdominal surgeries for abdominal adhesive disease, migraines, bipolar depression and hypertension who is here for follow-up.  1. Chronic constipation/IBS/history of abdominal adhesions -- she continues to have issues with constipation and incomplete evacuation despite 3 laxatives. She did not benefit from Relistor and actually doesn't seem to be using narcotics as frequently. Stress exacerbates her symptoms. I will have her continue Linzess 290 g daily, Amitiza 24 g twice daily and MiraLAX 17 g twice  daily. I would like her to use a mineral oil enema every morning or every evening to help with rectal stimulation to promote bowel movements. I'm also going to give her a colonoscopy bowel preparation for purge to be done 1 today or tomorrow.  2. GERD -- GERD diet  reviewed. She will continue pantoprazole 40 mg daily  3. Depression/insomnia -- continue close follow-up with mental health provider  4-6 months follow-up, sooner if necessary 25 minutes spent with the patient today. Greater than 50% was spent in counseling and coordination of care with the patient

## 2016-08-05 NOTE — Patient Instructions (Signed)
  PREPARATION FOR COLONOSCOPY WITH SUPREP   1. Drink clear liquids the entire day - NO SOLID FOOD  3. Drink at least 64 oz. (8 glasses) of fluid/clear liquids during the day to prevent dehydration and help the prep work efficiently.  Clear liquids include: water, ice, tea/coffee (sugar is ok, but no milk or cream), juice (apple, white grape, white cranberry), clear bouillon, consomme, broth, strained chicken noodle soup, jello, popsicles, powdered fruit flavored drinks, gatorade, lemonade, carbonated beverages, hard candy.  FOLLOW THESE PREP INSTRUCTIONS, NOT INSTRUCTION ON SUPREP BOX.  At 6:00pm: COMPLETE STEPS 1 THROUGH 4 BELOW:  Step 1: Pour ONE (1) 6-ounce bottle of SUPREP liquid into the mixing container. Step 2: Add cool drinking water to the 16-ounce line on the container and mix.   Step 3: Drink ALL of the 16-ounce diluted prep over 30 minutes. Step 4: You MUST drink an additional two (2) or more 16-ounce containers of water over the next one (1) hour.   Then, REPEAT until the second part of your prep is gone. _______________________________________________________________________________  Continue Linzess 290 mcg daily.  Take Amitiza 24 mg twice daily.  Please purchase the following medications over the counter and take as directed: Miralax 17 grams twice daily.  Please purchase the following medications over the counter and take as directed: Mineral Oil Enema daily  Follow up with Dr Hilarie Fredrickson in 4-6 months.  If you are age 10 or older, your body mass index should be between 23-30. Your Body mass index is 25.87 kg/m. If this is out of the aforementioned range listed, please consider follow up with your Primary Care Provider.  If you are age 30 or younger, your body mass index should be between 19-25. Your Body mass index is 25.87 kg/m. If this is out of the aformentioned range listed, please consider follow up with your Primary Care Provider.

## 2016-08-12 ENCOUNTER — Other Ambulatory Visit: Payer: Self-pay | Admitting: Family Medicine

## 2016-08-15 ENCOUNTER — Ambulatory Visit (HOSPITAL_COMMUNITY): Payer: Self-pay | Admitting: Psychiatry

## 2016-08-16 ENCOUNTER — Ambulatory Visit (HOSPITAL_COMMUNITY): Payer: Self-pay | Admitting: Psychiatry

## 2016-08-18 ENCOUNTER — Encounter (HOSPITAL_COMMUNITY): Payer: Self-pay | Admitting: Emergency Medicine

## 2016-08-18 ENCOUNTER — Emergency Department (HOSPITAL_COMMUNITY): Payer: 59

## 2016-08-18 ENCOUNTER — Emergency Department (HOSPITAL_COMMUNITY)
Admission: EM | Admit: 2016-08-18 | Discharge: 2016-08-18 | Disposition: A | Discharge status: Home / Self Care | Payer: 59 | Attending: Emergency Medicine | Admitting: Emergency Medicine

## 2016-08-18 DIAGNOSIS — Z79899 Other long term (current) drug therapy: Secondary | ICD-10-CM | POA: Diagnosis not present

## 2016-08-18 DIAGNOSIS — E1140 Type 2 diabetes mellitus with diabetic neuropathy, unspecified: Secondary | ICD-10-CM | POA: Diagnosis not present

## 2016-08-18 DIAGNOSIS — J45909 Unspecified asthma, uncomplicated: Secondary | ICD-10-CM | POA: Insufficient documentation

## 2016-08-18 DIAGNOSIS — I10 Essential (primary) hypertension: Secondary | ICD-10-CM | POA: Diagnosis not present

## 2016-08-18 DIAGNOSIS — Z91040 Latex allergy status: Secondary | ICD-10-CM | POA: Diagnosis not present

## 2016-08-18 DIAGNOSIS — K5900 Constipation, unspecified: Secondary | ICD-10-CM | POA: Insufficient documentation

## 2016-08-18 HISTORY — DX: Polyneuropathy, unspecified: G62.9

## 2016-08-18 LAB — CBC WITH DIFFERENTIAL/PLATELET
BASOS PCT: 0 %
Basophils Absolute: 0 10*3/uL (ref 0.0–0.1)
EOS ABS: 0.2 10*3/uL (ref 0.0–0.7)
Eosinophils Relative: 4 %
HCT: 35 % — ABNORMAL LOW (ref 36.0–46.0)
HEMOGLOBIN: 11.6 g/dL — AB (ref 12.0–15.0)
LYMPHS ABS: 1.9 10*3/uL (ref 0.7–4.0)
Lymphocytes Relative: 36 %
MCH: 30.8 pg (ref 26.0–34.0)
MCHC: 33.1 g/dL (ref 30.0–36.0)
MCV: 92.8 fL (ref 78.0–100.0)
Monocytes Absolute: 0.4 10*3/uL (ref 0.1–1.0)
Monocytes Relative: 8 %
NEUTROS PCT: 52 %
Neutro Abs: 2.7 10*3/uL (ref 1.7–7.7)
Platelets: 253 10*3/uL (ref 150–400)
RBC: 3.77 MIL/uL — AB (ref 3.87–5.11)
RDW: 12.8 % (ref 11.5–15.5)
WBC: 5.2 10*3/uL (ref 4.0–10.5)

## 2016-08-18 LAB — COMPREHENSIVE METABOLIC PANEL
ALK PHOS: 53 U/L (ref 38–126)
ALT: 14 U/L (ref 14–54)
ANION GAP: 6 (ref 5–15)
AST: 16 U/L (ref 15–41)
Albumin: 4.2 g/dL (ref 3.5–5.0)
BUN: 13 mg/dL (ref 6–20)
CALCIUM: 9.4 mg/dL (ref 8.9–10.3)
CO2: 24 mmol/L (ref 22–32)
Chloride: 108 mmol/L (ref 101–111)
Creatinine, Ser: 1.02 mg/dL — ABNORMAL HIGH (ref 0.44–1.00)
GFR calc non Af Amer: >60 mL/min (ref >60)
Glucose, Bld: 97 mg/dL (ref 65–99)
Potassium: 4.1 mmol/L (ref 3.5–5.1)
SODIUM: 138 mmol/L (ref 135–145)
Total Bilirubin: 0.6 mg/dL (ref 0.3–1.2)
Total Protein: 7.9 g/dL (ref 6.5–8.1)

## 2016-08-18 LAB — URINALYSIS, ROUTINE W REFLEX MICROSCOPIC
Bilirubin Urine: NEGATIVE
Glucose, UA: NEGATIVE mg/dL
Hgb urine dipstick: NEGATIVE
Ketones, ur: NEGATIVE mg/dL
Leukocytes, UA: NEGATIVE
NITRITE: NEGATIVE
Protein, ur: NEGATIVE mg/dL
SPECIFIC GRAVITY, URINE: 1.013 (ref 1.005–1.030)
pH: 7 (ref 5.0–8.0)

## 2016-08-18 LAB — LIPASE, BLOOD: Lipase: 30 U/L (ref 11–51)

## 2016-08-18 MED ORDER — ONDANSETRON HCL 4 MG/2ML IJ SOLN
4.0000 mg | Freq: Once | INTRAMUSCULAR | Status: AC
  Administered 2016-08-18: 4 mg via INTRAVENOUS
  Filled 2016-08-18: qty 2

## 2016-08-18 MED ORDER — SORBITOL 70 % SOLN: 960.0000 mL | TOPICAL_OIL | Freq: Once | ORAL | 0 refills | Status: AC

## 2016-08-18 MED ORDER — HYDROMORPHONE HCL 1 MG/ML IJ SOLN
1.0000 mg | Freq: Once | INTRAMUSCULAR | Status: AC
  Administered 2016-08-18: 1 mg via INTRAVENOUS
  Filled 2016-08-18: qty 1

## 2016-08-18 MED ORDER — SORBITOL 70 % SOLN
960.0000 mL | TOPICAL_OIL | Freq: Once | ORAL | Status: AC
  Administered 2016-08-18: 960 mL via RECTAL
  Filled 2016-08-18: qty 240

## 2016-08-18 MED ORDER — FENTANYL CITRATE (PF) 100 MCG/2ML IJ SOLN
50.0000 ug | Freq: Once | INTRAMUSCULAR | Status: AC
  Administered 2016-08-18: 50 ug via INTRAVENOUS
  Filled 2016-08-18: qty 2

## 2016-08-18 MED ORDER — LIDOCAINE HCL 2 % EX GEL
1.0000 "application " | Freq: Once | CUTANEOUS | Status: AC
  Administered 2016-08-18: 1 via TOPICAL
  Filled 2016-08-18: qty 11

## 2016-08-18 MED ORDER — SODIUM CHLORIDE 0.9 % IV BOLUS (SEPSIS)
500.0000 mL | Freq: Once | INTRAVENOUS | Status: AC
  Administered 2016-08-18: 500 mL via INTRAVENOUS

## 2016-08-18 NOTE — ED Triage Notes (Signed)
Pt states she has had a bowel reconstruction 2014. States part of her lower intestine was taken out. States she has to take Linzess 290 mcg and Amitiza 24 mg. States doctor gave her a suprep to try and wake her bowel back up. States the suprep helped for a moment and then stopped. Stated the pain started on her left side and it shifts to her right side. States hasn't been able to have a BM in about 2 days and it was only about 2 hard balls. Was told to try a oral enema and states nothing came out except for what felt like the enema oil. States she feels like she needs to throw up. States it hurts when she moves. States she came back on October 5th and states she was told they wanted her to take her back in for a immediate surgery. States took all of her medication last night but nothing PTA. States she has the feeling of needing to have a BM but only will expel a fart. States will strain to try and pass a Bm. States she felt around her rectum and states it feels closed up.

## 2016-08-18 NOTE — ED Notes (Addendum)
About 3/4 of enema given and patient has had large BM, filling 2 bedpans. Pt requesting small break then to finish enema. Pt still feels impacted.

## 2016-08-18 NOTE — ED Provider Notes (Signed)
[facility] DEPT Provider Note   CSN: [id number] Arrival date & time: 08/18/16  1037     History   Chief Complaint Chief Complaint  Patient presents with  . Constipation    HPI Jane English is a 45 y.o. female.  HPI  Jane English is a 45 y.o. female who presents to the Emergency Department complaining of constipation. She has a history of multiple abdominal surgeries and has experienced problems with constipation. She saw her gastroenterologist on October 2 for constipation and was continued on her home meds and given GoLYTELY and told to try an enema at home. She has had persistent problems with constipation with occasional small, hard stools. Last bowel movement was 2 days ago. She is able to pass gas without difficulty. She endorses 2 days of lower abdominal discomfort as well as nausea and intense sensation as if food make him back up. No fevers, vomiting.   Past Medical History:  Diagnosis Date  . Anemia   . Asthma   . Asthma flare 04/09/2013  . Back pain   . Bronchitis   . Chronic abdominal pain   . Chronic constipation   . Constipation due to opioid therapy   . Depression   . Diabetes mellitus without complication ([place])   . Diabetes mellitus, type II ([place])   . DVT (deep venous thrombosis) ([place]) 2010  . GERD (gastroesophageal reflux disease)   . Heart murmur    no cardiologist  . Helicobacter pylori gastritis 06/11/2013   Colonoscopy Dr. Hilarie Fredrickson  . Hypertension   . Migraine headache   . Neuropathy ([place])   . Obesity   . Obsessive-compulsive disorder   . PSYCHOTIC D/O W/HALLUCINATIONS CONDS CLASS ELSW 03/04/2010   Qualifier: Diagnosis of  By: Moshe Cipro MD, Joycelyn Schmid    . PTSD (post-traumatic stress disorder)   . SBO (small bowel obstruction) 08/09/2013  . Seasonal allergies 12/10/2012  . Seizures ([place])   . Shortness of breath     Patient Active Problem List   Diagnosis Date Noted  . Paresthesia 05/06/2016  . Back pain with radiation 04/04/2016  . Obesity  03/25/2016  . Neuropathic pain of finger of right hand 12/31/2015  . Pap smear of cervix shows high risk HPV present 08/08/2015  . Annual physical exam 08/04/2015  . Neck pain on left side 08/01/2015  . Allergic reaction 05/01/2015  . Cyst of left ovary 02/06/2015  . Anovulation 02/06/2015  . Secondary amenorrhea 02/06/2015  . Intractable chronic migraine without aura and without status migrainosus 12/21/2014  . Migraine 09/21/2014  . Abdominal pain 01/08/2014  . Excessive flatus 10/18/2013  . Acute cystitis 10/11/2013  . Anemia 10/11/2013  . H. pylori duodenitis 07/07/2013  . Colicky RLQ abdominal pain, probable chronic PSBO 07/07/2013  . PTSD (post-traumatic stress disorder) 03/26/2013  . Insomnia 03/18/2013  . Seasonal allergies 12/10/2012  . GERD (gastroesophageal reflux disease) 10/29/2012  . Chronic pain syndrome 10/06/2012  . Postlaminectomy syndrome 10/06/2012  . Cervical neck pain with evidence of disc disease 04/13/2012  . Neck muscle spasm 12/31/2011  . Anxiety state 08/30/2010  . Allergic rhinitis 04/17/2010  . Cough variant asthma 03/04/2010  . Bipolar disorder ([place]) 01/08/2009  . Prediabetes 01/05/2009  . Essential hypertension 11/27/2007    Past Surgical History:  Procedure Laterality Date  . ANTERIOR CERVICAL DECOMP/DISCECTOMY FUSION  07/07/2012   Procedure: ANTERIOR CERVICAL DECOMPRESSION/DISCECTOMY FUSION 2 LEVELS;  Surgeon: Floyce Stakes, MD;  Location: [facility];  Service: Neurosurgery;  Laterality: N/A;  Cervical  four-five, five - six  Anterior cervical decompression/diskectomy/fusion/plate  . APPENDECTOMY  1986  . BOWEL RESECTION N/A 07/29/2013   Procedure: serosal repair;  Surgeon: Adin Hector, MD;  Location: [facility];  Service: General;  Laterality: N/A;  . CARPAL TUNNEL RELEASE Bilateral   . LAPAROSCOPY N/A 07/29/2013   Procedure: diagnostic laporoscopy;  Surgeon: Adin Hector, MD;  Location: [facility];  Service: General;  Laterality: N/A;  .  LAPAROSCOPY N/A 08/16/2013   Procedure: LAPAROSCOPY DIAGNOSTIC/LYSIS OF ADHESIONS;  Surgeon: Adin Hector, MD;  Location: [facility];  Service: General;  Laterality: N/A;  . LAPAROTOMY N/A 08/16/2013   Procedure: EXPLORATORY LAPAROTOMY/SMALL BOWEL RESECTION (JEJUNUM);  Surgeon: Adin Hector, MD;  Location: [facility];  Service: General;  Laterality: N/A;  . LUMBAR SPINE SURGERY  2010   x 3  . LYSIS OF ADHESION  2003   Dr. Irving Shows  . LYSIS OF ADHESION N/A 07/29/2013   Procedure: LYSIS OF ADHESION;  Surgeon: Adin Hector, MD;  Location: [facility];  Service: General;  Laterality: N/A;  . OOPHORECTOMY    . PARTIAL HYSTERECTOMY  1990s?   [place], [place]  . SPINAL CORD STIMULATOR IMPLANT    . TRIGGER FINGER RELEASE  2009   right pinkie finger  . TUBAL LIGATION  1994    OB History    No data available       Home Medications    Prior to Admission medications   Medication Sig Start Date End Date Taking? Authorizing Provider  albuterol (PROVENTIL) (2.5 MG/3ML) 0.083% nebulizer solution Take 2.5 mg by nebulization every 6 (six) hours as needed for wheezing or shortness of breath.   Yes Historical Provider, MD  ALPRAZolam Duanne Moron) 1 MG tablet Take 1 mg by mouth 4 (four) times daily as needed for anxiety.   Yes Historical Provider, MD  azelastine (ASTELIN) 0.1 % nasal spray Place 2 sprays into both nostrils 2 (two) times daily as needed for rhinitis.   Yes Historical Provider, MD  beclomethasone (QVAR) 40 MCG/ACT inhaler Inhale 2 puffs into the lungs 2 (two) times daily. 02/15/15  Yes Fayrene Helper, MD  benzonatate (TESSALON) 100 MG capsule Take 100 mg by mouth 2 (two) times daily as needed for cough.   Yes Historical Provider, MD  Botulinum Toxin Type A 200 units SOLR Inject 200 Units as directed every 3 (three) months. 11/28/15  Yes Marcial Pacas, MD  budesonide-formoterol (SYMBICORT) 160-4.5 MCG/ACT inhaler Inhale 2 puffs into the lungs 2 (two) times daily.   Yes Historical Provider, MD    cloNIDine (CATAPRES) 0.1 MG tablet Take 0.1 mg by mouth at bedtime.   Yes Historical Provider, MD  clotrimazole-betamethasone (LOTRISONE) cream Apply 1 application topically 2 (two) times daily as needed (for rash).   Yes Historical Provider, MD  diazepam (VALIUM) 5 MG tablet Take 1 tablet (5 mg total) by mouth every 8 (eight) hours as needed for muscle spasms. 01/10/15  Yes Fayrene Helper, MD  diclofenac sodium (VOLTAREN) 1 % GEL Apply 4 g topically 4 (four) times daily as needed (for pain).    Yes Historical Provider, MD  Diphenhyd-Hydrocort-Nystatin (FIRST-DUKES MOUTHWASH) SUSP Use as directed 10 mLs in the mouth or throat 3 (three) times daily as needed (for irritation).   Yes Historical Provider, MD  escitalopram (LEXAPRO) 20 MG tablet Take 1 tablet (20 mg total) by mouth 2 (two) times daily. 08/01/16 08/01/17 Yes Cloria Spring, MD  fluconazole (DIFLUCAN) 150 MG tablet Take 150 mg by  mouth daily.   Yes Historical Provider, MD  fluticasone (FLONASE) 50 MCG/ACT nasal spray Place 2 sprays into both nostrils daily as needed for rhinitis.   Yes Historical Provider, MD  ipratropium (ATROVENT) 0.02 % nebulizer solution Take 0.5 mg by nebulization every 6 (six) hours as needed for wheezing or shortness of breath.   Yes Historical Provider, MD  linaclotide Rolan Lipa) 290 MCG CAPS capsule Take 1 capsule (290 mcg total) by mouth daily before breakfast. 08/05/16  Yes Jerene Bears, MD  lubiprostone (AMITIZA) 24 MCG capsule Take 24 mcg by mouth 2 (two) times daily with a meal.   Yes Historical Provider, MD  Methylnaltrexone Bromide 150 MG TABS Take 450 mg by mouth daily before breakfast.   Yes Historical Provider, MD  montelukast (SINGULAIR) 10 MG tablet Take 10 mg by mouth at bedtime.   Yes Historical Provider, MD  olopatadine (PATANOL) 0.1 % ophthalmic solution Place 1 drop into both eyes 2 (two) times daily.   Yes Historical Provider, MD  Oxycodone HCl 10 MG TABS Take 10 mg by mouth every 12 (twelve) hours  as needed (for pain).    Yes Historical Provider, MD  pantoprazole (PROTONIX) 40 MG tablet Take 1 tablet (40 mg total) by mouth daily. 07/31/16  Yes Jerene Bears, MD  phentermine (ADIPEX-P) 37.5 MG tablet Take 37.5 mg by mouth daily before breakfast.   Yes Historical Provider, MD  potassium chloride 20 MEQ/15ML (10%) SOLN Take 20 mEq by mouth 2 (two) times daily.   Yes Historical Provider, MD  rizatriptan (MAXALT-MLT) 10 MG disintegrating tablet Take 10 mg by mouth as needed for migraine. May repeat in 2 hours if needed   Yes Historical Provider, MD  spironolactone (ALDACTONE) 25 MG tablet Take 25 mg by mouth daily.   Yes Historical Provider, MD  SUMAtriptan (IMITREX) 6 MG/0.5ML SOLN injection Inject 0.5 mLs (6 mg total) into the skin every 2 (two) hours as needed for migraine or headache. May repeat in 2 hours if headache persists or recurs. 05/16/16  Yes Marcial Pacas, MD  terbinafine (LAMISIL) 250 MG tablet Take 1 tablet (250 mg total) by mouth daily. 06/10/16  Yes Fayrene Helper, MD  topiramate (TOPAMAX) 50 MG tablet Take 2 tablets (100 mg total) by mouth 2 (two) times daily. 05/16/16  Yes Marcial Pacas, MD  traZODone (DESYREL) 50 MG tablet Take 1 tablet (50 mg total) by mouth at bedtime. 08/01/16  Yes Cloria Spring, MD  triamterene-hydrochlorothiazide (DYAZIDE) 37.5-25 MG capsule Take 1 each (1 capsule total) by mouth daily. 07/31/16  Yes Fayrene Helper, MD  vitamin B-12 (CYANOCOBALAMIN) 1000 MCG tablet Take 1 tablet (1,000 mcg total) by mouth daily. 04/07/15  Yes Jerene Bears, MD    Family History Family History  Problem Relation Age of Onset  . Lung cancer Father   . Stomach cancer Father   . Esophageal cancer Father   . Alcohol abuse Father   . Mental illness Father   . Diabetes Sister   . Hypertension Sister   . Bipolar disorder Sister   . Schizophrenia Sister   . Diabetes Sister   . Alcohol abuse Brother   . Hypertension Brother   . Kidney disease Brother   . Diabetes Brother   .  Drug abuse Brother   . Mental illness Brother   . Alcohol abuse Brother   . Alcohol abuse Brother   . Hypertension Brother   . Diabetes Brother   . Alcohol abuse Brother   .  Physical abuse Mother   . Alcohol abuse Mother   . Mental illness Brother     Deceased  . Drug abuse Sister   . Alcohol abuse Brother   . ADD / ADHD Neg Hx   . Anxiety disorder Neg Hx   . Dementia Neg Hx   . Depression Neg Hx   . OCD Neg Hx   . Seizures Neg Hx   . Paranoid behavior Neg Hx   . Colon cancer Neg Hx     Social History Social History  Substance Use Topics  . Smoking status: Never Smoker  . Smokeless tobacco: Never Used  . Alcohol use No     Allergies   Amoxicillin; Neurontin [gabapentin]; Penicillins; Pregabalin; Tramadol; and Latex   Review of Systems Review of Systems  All other systems reviewed and are negative.    Physical Exam Updated Vital Signs BP 101/59 (BP Location: Left Arm)   Pulse (!) 53   Temp 98.4 F (36.9 C) (Oral)   Resp 16   SpO2 98%   Physical Exam  Constitutional: She is oriented to person, place, and time. She appears well-developed and well-nourished.  HENT:  Head: Normocephalic and atraumatic.  Cardiovascular: Normal rate and regular rhythm.   No murmur heard. Pulmonary/Chest: Effort normal and breath sounds normal. No respiratory distress.  Abdominal: Soft. There is no rebound and no guarding.  Mild diffuse lower abdominal tenderness  Genitourinary:  Genitourinary Comments: Empty rectal vault.  Musculoskeletal: She exhibits no edema or tenderness.  Neurological: She is alert and oriented to person, place, and time.  Skin: Skin is warm and dry.  Psychiatric: She has a normal mood and affect. Her behavior is normal.  Nursing note and vitals reviewed.    ED Treatments / Results  Labs (all labs ordered are listed, but only abnormal results are displayed) Labs Reviewed  COMPREHENSIVE METABOLIC PANEL - Abnormal; Notable for the following:        Result Value   Creatinine, Ser 1.02 (*)    All other components within normal limits  CBC WITH DIFFERENTIAL/PLATELET - Abnormal; Notable for the following:    RBC 3.77 (*)    Hemoglobin 11.6 (*)    HCT 35.0 (*)    All other components within normal limits  URINALYSIS, ROUTINE W REFLEX MICROSCOPIC (NOT AT [facility]) - Abnormal; Notable for the following:    APPearance CLOUDY (*)    All other components within normal limits  LIPASE, BLOOD  POC URINE PREG, ED    EKG  EKG Interpretation None       Radiology Dg Abd Acute W/chest  Result Date: 08/18/2016 CLINICAL DATA:  Abdominal pain and constipation. EXAM: DG ABDOMEN ACUTE W/ 1V CHEST COMPARISON:  05/23/2015 chest x-ray FINDINGS: The lungs are clear wiithout focal pneumonia, edema, pneumothorax or pleural effusion. The cardiopericardial silhouette is within normal limits for size. The visualized bony structures of the thorax are intact. Spinal stimulator leads overlie the thoracic spine. Upright film shows no evidence for intraperitoneal free air. There is no evidence for gaseous bowel dilation to suggest obstruction. Patient is status post lower lumbar fusion IMPRESSION: Negative abdominal radiographs.  No acute cardiopulmonary disease. Electronically Signed   By: Misty Stanley M.D.   On: 08/18/2016 13:42    Procedures Procedures (including critical care time)  Medications Ordered in ED Medications  fentaNYL (SUBLIMAZE) injection 50 mcg (50 mcg Intravenous Given 08/18/16 1351)  ondansetron (ZOFRAN) injection 4 mg (4 mg Intravenous Given 08/18/16 1351)  sodium chloride  0.9 % bolus 500 mL (0 mLs Intravenous Stopped 08/18/16 1720)  lidocaine (XYLOCAINE) 2 % jelly 1 application (1 application Topical Given 08/18/16 1533)  sorbitol, milk of mag, mineral oil, glycerin (SMOG) enema (960 mLs Rectal Given 08/18/16 1544)  fentaNYL (SUBLIMAZE) injection 50 mcg (50 mcg Intravenous Given 08/18/16 1534)  HYDROmorphone (DILAUDID) injection 1 mg (1  mg Intravenous Given 08/18/16 1715)  ondansetron (ZOFRAN) injection 4 mg (4 mg Intravenous Given 08/18/16 1715)     Initial Impression / Assessment and Plan / ED Course  I have reviewed the triage vital signs and the nursing notes.  Pertinent labs & imaging results that were available during my care of the patient were reviewed by me and considered in my medical decision making (see chart for details).  Clinical Course    Patient with history of multiple abdominal surgeries here with constipation and decreased stool output. She has mild tenderness on examination. The presentation is not consistent with obstruction. Following treatment with enema in the emergency department she had significant stool output. Plan to DC home with close gastroenterology follow-up. Return precautions discussed.  Final Clinical Impressions(s) / ED Diagnoses   Final diagnoses:  Constipation, unspecified constipation type    New Prescriptions New Prescriptions   No medications on file     Quintella Reichert, MD 08/19/16 [phone]

## 2016-08-19 ENCOUNTER — Telehealth: Payer: Self-pay | Admitting: Internal Medicine

## 2016-08-19 ENCOUNTER — Other Ambulatory Visit: Payer: Self-pay

## 2016-08-19 MED ORDER — SORBITOL 70 % SOLN: 960.0000 mL | TOPICAL_OIL | Freq: Once | ORAL | 0 refills | Status: AC

## 2016-08-19 NOTE — Telephone Encounter (Signed)
Can be prescribed if available Would be PRN only, not for daily use

## 2016-08-19 NOTE — Telephone Encounter (Signed)
Script sent to pharmacy and pt aware. 

## 2016-08-19 NOTE — Telephone Encounter (Signed)
patient states that she went to ED last night because she couldn't have a BM. she says that she was prescribed sorbitol-magnesium hydroxide. Pt says that she was given one refill but is wanting to know if Dr. Hilarie Fredrickson would write her an rx just in case she needs this again.

## 2016-08-19 NOTE — Telephone Encounter (Signed)
Pt states she was seen in the ER last night for constipation, states nothing she had at home had helped. Pt was given a sorbitol, MOM, Mineral oil, gylcerin (SMOG) enema. Pt states this worked and she filled 3 bed pans. Pt wants to know if Dr. Hilarie Fredrickson will write her a script for this so she can have some on hand if this happens again. Please advise.

## 2016-08-20 ENCOUNTER — Encounter: Payer: Self-pay | Admitting: Family Medicine

## 2016-08-21 ENCOUNTER — Encounter: Payer: Self-pay | Admitting: Neurology

## 2016-08-21 ENCOUNTER — Ambulatory Visit (INDEPENDENT_AMBULATORY_CARE_PROVIDER_SITE_OTHER): Payer: 59 | Admitting: Neurology

## 2016-08-21 VITALS — BP 112/63 | HR 82 | Ht 66.0 in | Wt 161.0 lb

## 2016-08-21 DIAGNOSIS — G43719 Chronic migraine without aura, intractable, without status migrainosus: Secondary | ICD-10-CM | POA: Diagnosis not present

## 2016-08-21 NOTE — Progress Notes (Signed)
**  Botox 100 units x 2 vials, Lot [id number], Exp 02/2019, [facility] [id number], [facility]//[email or web address],rn.**
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
## 2016-08-22 ENCOUNTER — Ambulatory Visit: Payer: 59 | Admitting: Neurology

## 2016-08-29 ENCOUNTER — Ambulatory Visit (INDEPENDENT_AMBULATORY_CARE_PROVIDER_SITE_OTHER): Payer: 59 | Admitting: Psychiatry

## 2016-08-29 ENCOUNTER — Encounter (HOSPITAL_COMMUNITY): Payer: Self-pay | Admitting: Psychiatry

## 2016-08-29 DIAGNOSIS — F3175 Bipolar disorder, in partial remission, most recent episode depressed: Secondary | ICD-10-CM | POA: Diagnosis not present
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
## 2016-09-10 ENCOUNTER — Other Ambulatory Visit (HOSPITAL_COMMUNITY)
Admission: RE | Admit: 2016-09-10 | Discharge: 2016-09-10 | Disposition: A | Discharge status: Home / Self Care | Payer: 59 | Source: Ambulatory Visit | Attending: Family Medicine | Admitting: Family Medicine

## 2016-09-10 ENCOUNTER — Encounter: Payer: Self-pay | Admitting: Family Medicine

## 2016-09-10 ENCOUNTER — Ambulatory Visit (INDEPENDENT_AMBULATORY_CARE_PROVIDER_SITE_OTHER): Payer: 59 | Admitting: Family Medicine

## 2016-09-10 ENCOUNTER — Other Ambulatory Visit: Payer: Self-pay

## 2016-09-10 VITALS — BP 100/70 | HR 64 | Resp 16 | Ht 66.0 in | Wt 157.0 lb

## 2016-09-10 DIAGNOSIS — Z124 Encounter for screening for malignant neoplasm of cervix: Secondary | ICD-10-CM

## 2016-09-10 DIAGNOSIS — Z0000 Encounter for general adult medical examination without abnormal findings: Secondary | ICD-10-CM

## 2016-09-10 DIAGNOSIS — R87810 Cervical high risk human papillomavirus (HPV) DNA test positive: Secondary | ICD-10-CM

## 2016-09-10 DIAGNOSIS — Z01419 Encounter for gynecological examination (general) (routine) without abnormal findings: Secondary | ICD-10-CM | POA: Insufficient documentation

## 2016-09-10 DIAGNOSIS — Z1211 Encounter for screening for malignant neoplasm of colon: Secondary | ICD-10-CM | POA: Diagnosis not present

## 2016-09-10 DIAGNOSIS — N870 Mild cervical dysplasia: Secondary | ICD-10-CM | POA: Diagnosis not present

## 2016-09-10 DIAGNOSIS — I10 Essential (primary) hypertension: Secondary | ICD-10-CM

## 2016-09-10 DIAGNOSIS — Z1159 Encounter for screening for other viral diseases: Secondary | ICD-10-CM

## 2016-09-10 DIAGNOSIS — J302 Other seasonal allergic rhinitis: Secondary | ICD-10-CM

## 2016-09-10 LAB — POC HEMOCCULT BLD/STL (OFFICE/1-CARD/DIAGNOSTIC): FECAL OCCULT BLD: NEGATIVE

## 2016-09-10 MED ORDER — LORATADINE 10 MG PO TBDP: 10.0000 mg | ORAL_TABLET | Freq: Every day | ORAL | 2 refills | Status: DC

## 2016-09-10 MED ORDER — CLOTRIMAZOLE-BETAMETHASONE 1-0.05 % EX CREA: 1.0000 "application " | TOPICAL_CREAM | Freq: Two times a day (BID) | CUTANEOUS | 0 refills | Status: DC | PRN

## 2016-09-10 MED ORDER — PREDNISONE 5 MG PO TABS: 5.0000 mg | ORAL_TABLET | Freq: Two times a day (BID) | ORAL | 0 refills | Status: AC

## 2016-09-10 MED ORDER — TRIAMTERENE-HCTZ 37.5-25 MG PO TABS: ORAL_TABLET | ORAL | 3 refills | Status: DC

## 2016-09-10 MED ORDER — ZINC OXIDE 12.8 % EX OINT: 1.0000 "application " | TOPICAL_OINTMENT | CUTANEOUS | 0 refills | Status: DC | PRN

## 2016-09-10 MED ORDER — BECLOMETHASONE DIPROPIONATE 40 MCG/ACT IN AERS: 2.0000 | INHALATION_SPRAY | Freq: Two times a day (BID) | RESPIRATORY_TRACT | 12 refills | Status: DC

## 2016-09-10 NOTE — Assessment & Plan Note (Signed)
Over corrected Reduce to half triamterene daily Recheck BP in 4 weeks

## 2016-09-10 NOTE — Patient Instructions (Addendum)
Nurse bP check in 4 weeks, call if you need before  MD f/u in 2 months  Reduce triamteren to hALF tablet , start today, BP is low and you get light headed.  5 day prednisone course sent for allergies and loratidine added one daily  Barrier cream sent for use as needed  Hope you feeel better soon  HBA1C, tSH and chem 7 non fast also hep C per your request to be done ion 2 months for f/u with me

## 2016-09-10 NOTE — Assessment & Plan Note (Signed)
Uncontrolled, 5 day course of prednisone 

## 2016-09-10 NOTE — Assessment & Plan Note (Signed)

## 2016-09-11 ENCOUNTER — Other Ambulatory Visit: Payer: Self-pay

## 2016-09-11 MED ORDER — ZINC OXIDE 12.8 % EX OINT: 1.0000 "application " | TOPICAL_OINTMENT | CUTANEOUS | 0 refills | Status: DC | PRN

## 2016-09-12 ENCOUNTER — Ambulatory Visit (INDEPENDENT_AMBULATORY_CARE_PROVIDER_SITE_OTHER): Payer: 59 | Admitting: Psychiatry

## 2016-09-12 ENCOUNTER — Encounter (HOSPITAL_COMMUNITY): Payer: Self-pay | Admitting: Psychiatry

## 2016-09-12 VITALS — BP 106/70 | HR 68 | Ht 66.0 in | Wt 155.2 lb

## 2016-09-12 DIAGNOSIS — G43919 Migraine, unspecified, intractable, without status migrainosus: Secondary | ICD-10-CM | POA: Diagnosis not present

## 2016-09-12 DIAGNOSIS — I10 Essential (primary) hypertension: Secondary | ICD-10-CM | POA: Diagnosis not present

## 2016-09-12 DIAGNOSIS — Z79899 Other long term (current) drug therapy: Secondary | ICD-10-CM

## 2016-09-12 DIAGNOSIS — Z6824 Body mass index (BMI) 24.0-24.9, adult: Secondary | ICD-10-CM | POA: Diagnosis not present

## 2016-09-12 DIAGNOSIS — F3175 Bipolar disorder, in partial remission, most recent episode depressed: Secondary | ICD-10-CM

## 2016-09-12 DIAGNOSIS — Z0000 Encounter for general adult medical examination without abnormal findings: Secondary | ICD-10-CM | POA: Diagnosis not present

## 2016-09-12 DIAGNOSIS — K219 Gastro-esophageal reflux disease without esophagitis: Secondary | ICD-10-CM | POA: Diagnosis not present

## 2016-09-12 DIAGNOSIS — F4310 Post-traumatic stress disorder, unspecified: Secondary | ICD-10-CM

## 2016-09-12 DIAGNOSIS — G40909 Epilepsy, unspecified, not intractable, without status epilepticus: Secondary | ICD-10-CM | POA: Diagnosis not present

## 2016-09-12 DIAGNOSIS — J449 Chronic obstructive pulmonary disease, unspecified: Secondary | ICD-10-CM | POA: Diagnosis not present

## 2016-09-12 DIAGNOSIS — I209 Angina pectoris, unspecified: Secondary | ICD-10-CM | POA: Diagnosis not present

## 2016-09-12 MED ORDER — ALPRAZOLAM 1 MG PO TABS: 1.0000 mg | ORAL_TABLET | Freq: Three times a day (TID) | ORAL | 2 refills | Status: DC | PRN

## 2016-09-12 MED ORDER — TRAZODONE HCL 50 MG PO TABS: 50.0000 mg | ORAL_TABLET | Freq: Every day | ORAL | 2 refills | Status: DC

## 2016-09-12 MED ORDER — ESCITALOPRAM OXALATE 20 MG PO TABS: 20.0000 mg | ORAL_TABLET | Freq: Every day | ORAL | 2 refills | Status: DC
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
## 2016-09-13 ENCOUNTER — Ambulatory Visit (INDEPENDENT_AMBULATORY_CARE_PROVIDER_SITE_OTHER): Payer: 59 | Admitting: Psychiatry

## 2016-09-13 ENCOUNTER — Encounter (HOSPITAL_COMMUNITY): Payer: Self-pay | Admitting: Psychiatry

## 2016-09-13 DIAGNOSIS — F3175 Bipolar disorder, in partial remission, most recent episode depressed: Secondary | ICD-10-CM

## 2016-09-13 LAB — CYTOLOGY - PAP
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
## 2016-09-15 ENCOUNTER — Other Ambulatory Visit: Payer: Self-pay | Admitting: Family Medicine

## 2016-09-15 DIAGNOSIS — B977 Papillomavirus as the cause of diseases classified elsewhere: Secondary | ICD-10-CM

## 2016-09-15 DIAGNOSIS — N870 Mild cervical dysplasia: Secondary | ICD-10-CM

## 2016-09-15 DIAGNOSIS — N72 Inflammatory disease of cervix uteri: Secondary | ICD-10-CM

## 2016-09-15 NOTE — Assessment & Plan Note (Addendum)
Increased and uncontrolled symptoms with sinus drainage and tenderness, no fever or chills , additional allergy medication started and encouraged netty flush daily

## 2016-09-15 NOTE — Assessment & Plan Note (Signed)
rept pap at visit for follow up and re evaluation

## 2016-09-15 NOTE — Progress Notes (Signed)
    Jane English     MRN: [id number]      DOB: 1971/02/18  HPI: Patient is in for annual physical exam. C/o perianal soreness from diarrhea. C/o sinus tenderness and increased drainage C/o increased stress from all angles, plans to leave for [place] this weekend,nees to "get away" Recent labs, if available are reviewed. Immunization is reviewed , and  updated if needed.   PE: BP 100/70   Pulse 64   Resp 16   Ht 5\' 6"  (1.676 m)   Wt 157 lb (71.2 kg)   SpO2 99%   BMI 25.34 kg/m   Pleasant  female, alert and oriented x 3, in no cardio-pulmonary distress. Afebrile. HEENT No facial trauma or asymetry. Sinuses non tender.  Extra occullar muscles intact,External ears normal, tympanic membranes clear. Oropharynx moist, no exudate. Neck: supple, no adenopathy,JVD or thyromegaly.No bruits. Erythema and edema of nasal mucosa  Chest: Clear to ascultation bilaterally.No crackles or wheezes. Non tender to palpation  Breast: No asymetry,no masses or lumps. No tenderness. No nipple discharge or inversion. No axillary or supraclavicular adenopathy  Cardiovascular system; Heart sounds normal,  S1 and  S2 ,no S3.  No murmur, or thrill. Apical beat not displaced Peripheral pulses normal.  Abdomen: Soft, non tender, no organomegaly or masses. No bruits. Bowel sounds normal. No guarding, tenderness or rebound.  Rectal:  Normal sphincter tone.perianal skin irritation, no superinfection noted No rectal mass. Guaiac negative stool.  GU: External genitalia normal female genitalia , normal female distribution of hair. No lesions. Urethral meatus normal in size, no  Prolapse, no lesions visibly  Present. Bladder non tender. Vagina pink and moist , with no visible lesions , discharge present . Adequate pelvic support no  cystocele or rectocele noted Cervix pink and appears healthy, no lesions or ulcerations noted, white , physiologic appearing  discharge noted from os Uterus normal size,  no adnexal masses, no cervical motion or adnexal tenderness.   Musculoskeletal exam: Full ROM of spine, hips , shoulders and knees. No deformity ,swelling or crepitus noted. No muscle wasting or atrophy.   Neurologic: Cranial nerves 2 to 12 intact. Power, tone ,sensation and reflexes normal throughout. No disturbance in gait. No tremor.  Skin: Intact, no ulceration, erythema , scaling or rash noted. Pigmentation normal throughout  Psych; Normal mood and affect. Judgement and concentration normal   Assessment & Plan:  Annual physical exam Annual exam as documented. Counseling done  re healthy lifestyle involving commitment to 150 minutes exercise per week, heart healthy diet, and attaining healthy weight.The importance of adequate sleep also discussed. Regular seat belt use and home safety, is also discussed. Changes in health habits are decided on by the patient with goals and time frames  set for achieving them. Immunization and cancer screening needs are specifically addressed at this visit.   Essential hypertension Over corrected Reduce to half triamterene daily Recheck BP in 4 weeks  Allergic rhinitis Uncontrolled , 5 day course of prednisone

## 2016-09-22 ENCOUNTER — Other Ambulatory Visit: Payer: Self-pay | Admitting: Family Medicine

## 2016-09-23 ENCOUNTER — Ambulatory Visit (HOSPITAL_COMMUNITY): Payer: Self-pay | Admitting: Psychiatry

## 2016-09-30 ENCOUNTER — Other Ambulatory Visit: Payer: Self-pay | Admitting: Family Medicine

## 2016-10-05 ENCOUNTER — Other Ambulatory Visit: Payer: Self-pay | Admitting: Family Medicine

## 2016-10-07 DIAGNOSIS — M961 Postlaminectomy syndrome, not elsewhere classified: Secondary | ICD-10-CM | POA: Diagnosis not present

## 2016-10-07 DIAGNOSIS — M1711 Unilateral primary osteoarthritis, right knee: Secondary | ICD-10-CM | POA: Diagnosis not present

## 2016-10-07 DIAGNOSIS — G894 Chronic pain syndrome: Secondary | ICD-10-CM | POA: Diagnosis not present

## 2016-10-08 ENCOUNTER — Ambulatory Visit: Payer: 59

## 2016-10-08 VITALS — BP 118/60

## 2016-10-08 DIAGNOSIS — I10 Essential (primary) hypertension: Secondary | ICD-10-CM

## 2016-10-08 NOTE — Progress Notes (Signed)
Patient in for blood pressure check due to blood pressure being too low previously.   Recently changed to 1/2 tablet of Maxzide.  Blood pressure in normal range today.  Will continue 1/2 tab of Maxzide and call back with any problems.

## 2016-10-09 ENCOUNTER — Encounter (HOSPITAL_COMMUNITY): Payer: Self-pay | Admitting: Psychiatry

## 2016-10-09 ENCOUNTER — Ambulatory Visit (INDEPENDENT_AMBULATORY_CARE_PROVIDER_SITE_OTHER): Payer: 59 | Admitting: Psychiatry

## 2016-10-09 DIAGNOSIS — R69 Illness, unspecified: Secondary | ICD-10-CM | POA: Diagnosis not present

## 2016-10-09 DIAGNOSIS — F3175 Bipolar disorder, in partial remission, most recent episode depressed: Secondary | ICD-10-CM | POA: Diagnosis not present
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
## 2016-10-14 ENCOUNTER — Encounter: Payer: Self-pay | Admitting: Obstetrics and Gynecology

## 2016-10-14 ENCOUNTER — Ambulatory Visit (INDEPENDENT_AMBULATORY_CARE_PROVIDER_SITE_OTHER): Payer: 59 | Admitting: Obstetrics and Gynecology

## 2016-10-14 ENCOUNTER — Other Ambulatory Visit: Payer: Self-pay | Admitting: Obstetrics and Gynecology

## 2016-10-14 VITALS — BP 112/72 | HR 109 | Ht 66.0 in | Wt 165.0 lb

## 2016-10-14 DIAGNOSIS — R87612 Low grade squamous intraepithelial lesion on cytologic smear of cervix (LGSIL): Secondary | ICD-10-CM

## 2016-10-14 DIAGNOSIS — N912 Amenorrhea, unspecified: Secondary | ICD-10-CM

## 2016-10-14 DIAGNOSIS — N870 Mild cervical dysplasia: Secondary | ICD-10-CM

## 2016-10-14 DIAGNOSIS — R87629 Unspecified abnormal cytological findings in specimens from vagina: Secondary | ICD-10-CM | POA: Insufficient documentation

## 2016-10-14 DIAGNOSIS — N879 Dysplasia of cervix uteri, unspecified: Secondary | ICD-10-CM | POA: Diagnosis not present

## 2016-10-14 NOTE — Progress Notes (Signed)
Patient ID: Jane English, female   DOB: 05/15/1971, 45 y.o.   MRN: [id number]  Chief Complaint  Patient presents with  . Referral    abnormal pap    Pt also complains of chronic RLQ pain s/p bowel resection 2014 and the lack of periods for over a year. She states she has chronic issues with constipation and blockages since her GI surgery was complex. She is followed by GI Dr. Hilarie Fredrickson and PCP Dr. Moshe Cipro. G5P4. Pt has h/o tubal ligation.  Pt seems chronically distracted by the abdominal surgery and adjustments in diet required for day to day activities.  Colposcopy Procedure Note  Jane English 45 y.o. No obstetric history on file. here for colposcopy for low-grade squamous intraepithelial neoplasia (LGSIL - encompassing HPV,mild dysplasia,CIN I) pap smear on 09/10/16.   Discussed role for HPV in cervical dysplasia, need for surveillance.  Patient given informed consent, signed copy in the chart, time out was performed.  Placed in lithotomy position. Cervix viewed with speculum and colposcope after application of acetic acid.   Colposcopy adequate? Yes  Cervix normal in appearance, smooth. Very clear mucous. Small nabothian cyst at 1 o'clock. No acetowhite lesions appreciated. Vascular pattern on anterior cervix has not revealed acetowhite lesions. NO biopsies obtained.   ECC specimen obtained.  All specimens were labelled and sent to pathology.   Colposcopy IMPRESSION: No visible dysplasia   Patient was given post procedure instructions. Will follow up pathology and manage accordingly.  Routine preventative health maintenance measures emphasized.  Assessment  Cervical mucous appears generous, clear,  ?anovulatory rather than postmenopausal.  Plan 1 F/u ECC  1. Obtain FSH levels  2. F/u 4 wk for [place] of pelvis, further discussion of other complaints.    By signing my name below, I, Jane English, attest that this documentation has been prepared under the direction and in the presence of  Jonnie Kind, MD. Electronically Signed: Hansel English, ED Scribe. 10/14/16. 11:14 AM.  I personally performed the services described in this documentation, which was SCRIBED in my presence. The recorded information has been reviewed and considered accurate. It has been edited as necessary during review. Jonnie Kind, MD

## 2016-10-15 DIAGNOSIS — N912 Amenorrhea, unspecified: Secondary | ICD-10-CM | POA: Insufficient documentation

## 2016-10-16 ENCOUNTER — Other Ambulatory Visit: Payer: Self-pay | Admitting: Family Medicine

## 2016-10-16 LAB — FOLLICLE STIMULATING HORMONE: FSH: 3.9 m[IU]/mL

## 2016-10-17 ENCOUNTER — Ambulatory Visit: Payer: 59

## 2016-10-17 VITALS — BP 116/68

## 2016-10-17 DIAGNOSIS — I10 Essential (primary) hypertension: Secondary | ICD-10-CM

## 2016-10-17 NOTE — Progress Notes (Signed)
Patient in for blood pressure check.  She feels that she may be dehydrated and that her blood pressure may be too low.   Blood pressure within normal range.  Encouraged patient to increase fluids.  To try humidifier in home for nasal dryness and to continue prescribed medications.   She will call back with any further problems.

## 2016-10-22 ENCOUNTER — Ambulatory Visit (INDEPENDENT_AMBULATORY_CARE_PROVIDER_SITE_OTHER): Payer: 59 | Admitting: Psychiatry

## 2016-10-22 ENCOUNTER — Encounter (HOSPITAL_COMMUNITY): Payer: Self-pay | Admitting: Psychiatry

## 2016-10-22 VITALS — BP 111/67 | HR 77 | Ht 66.0 in | Wt 163.0 lb

## 2016-10-22 DIAGNOSIS — Z811 Family history of alcohol abuse and dependence: Secondary | ICD-10-CM

## 2016-10-22 DIAGNOSIS — F3175 Bipolar disorder, in partial remission, most recent episode depressed: Secondary | ICD-10-CM

## 2016-10-22 DIAGNOSIS — Z818 Family history of other mental and behavioral disorders: Secondary | ICD-10-CM | POA: Diagnosis not present

## 2016-10-22 DIAGNOSIS — Z813 Family history of other psychoactive substance abuse and dependence: Secondary | ICD-10-CM | POA: Diagnosis not present

## 2016-10-22 DIAGNOSIS — Z79891 Long term (current) use of opiate analgesic: Secondary | ICD-10-CM

## 2016-10-22 DIAGNOSIS — Z79899 Other long term (current) drug therapy: Secondary | ICD-10-CM

## 2016-10-22 MED ORDER — ALPRAZOLAM 1 MG PO TABS: 1.0000 mg | ORAL_TABLET | Freq: Three times a day (TID) | ORAL | 2 refills | Status: DC | PRN

## 2016-10-22 MED ORDER — ESCITALOPRAM OXALATE 20 MG PO TABS: 20.0000 mg | ORAL_TABLET | Freq: Every day | ORAL | 2 refills | Status: DC

## 2016-10-22 MED ORDER — TRAZODONE HCL 50 MG PO TABS: 50.0000 mg | ORAL_TABLET | Freq: Every day | ORAL | 2 refills | Status: DC
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
## 2016-10-24 ENCOUNTER — Encounter (HOSPITAL_COMMUNITY): Payer: Self-pay | Admitting: Psychiatry

## 2016-10-24 ENCOUNTER — Ambulatory Visit (INDEPENDENT_AMBULATORY_CARE_PROVIDER_SITE_OTHER): Payer: 59 | Admitting: Psychiatry

## 2016-10-24 DIAGNOSIS — F3175 Bipolar disorder, in partial remission, most recent episode depressed: Secondary | ICD-10-CM
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
## 2016-11-08 ENCOUNTER — Other Ambulatory Visit: Payer: Self-pay | Admitting: Obstetrics and Gynecology

## 2016-11-08 DIAGNOSIS — N912 Amenorrhea, unspecified: Secondary | ICD-10-CM

## 2016-11-11 ENCOUNTER — Encounter: Payer: Self-pay | Admitting: Family Medicine

## 2016-11-11 ENCOUNTER — Ambulatory Visit (HOSPITAL_COMMUNITY)
Admission: RE | Admit: 2016-11-11 | Discharge: 2016-11-11 | Disposition: A | Discharge status: Home / Self Care | Payer: 59 | Source: Ambulatory Visit | Attending: Family Medicine | Admitting: Family Medicine

## 2016-11-11 ENCOUNTER — Ambulatory Visit: Payer: Self-pay | Admitting: Family Medicine

## 2016-11-11 ENCOUNTER — Ambulatory Visit: Payer: 59

## 2016-11-11 ENCOUNTER — Ambulatory Visit (INDEPENDENT_AMBULATORY_CARE_PROVIDER_SITE_OTHER): Payer: 59

## 2016-11-11 ENCOUNTER — Ambulatory Visit (INDEPENDENT_AMBULATORY_CARE_PROVIDER_SITE_OTHER): Payer: 59 | Admitting: Family Medicine

## 2016-11-11 VITALS — BP 106/72 | HR 110 | Temp 99.5°F | Resp 18 | Wt 168.0 lb

## 2016-11-11 DIAGNOSIS — R7303 Prediabetes: Secondary | ICD-10-CM

## 2016-11-11 DIAGNOSIS — J0140 Acute pansinusitis, unspecified: Secondary | ICD-10-CM | POA: Insufficient documentation

## 2016-11-11 DIAGNOSIS — R05 Cough: Secondary | ICD-10-CM | POA: Diagnosis not present

## 2016-11-11 DIAGNOSIS — J209 Acute bronchitis, unspecified: Secondary | ICD-10-CM | POA: Diagnosis not present

## 2016-11-11 DIAGNOSIS — N854 Malposition of uterus: Secondary | ICD-10-CM | POA: Diagnosis not present

## 2016-11-11 DIAGNOSIS — I10 Essential (primary) hypertension: Secondary | ICD-10-CM | POA: Diagnosis not present

## 2016-11-11 DIAGNOSIS — D251 Intramural leiomyoma of uterus: Secondary | ICD-10-CM | POA: Diagnosis not present

## 2016-11-11 DIAGNOSIS — R51 Headache: Secondary | ICD-10-CM | POA: Diagnosis not present

## 2016-11-11 LAB — CBC WITH DIFFERENTIAL/PLATELET
BASOS PCT: 0 %
Basophils Absolute: 0 cells/uL (ref 0–200)
Eosinophils Absolute: 248 cells/uL (ref 15–500)
Eosinophils Relative: 4 %
HEMATOCRIT: 37.8 % (ref 35.0–45.0)
HEMOGLOBIN: 12.4 g/dL (ref 11.7–15.5)
LYMPHS ABS: 1178 {cells}/uL (ref 850–3900)
Lymphocytes Relative: 19 %
MCH: 29.7 pg (ref 27.0–33.0)
MCHC: 32.8 g/dL (ref 32.0–36.0)
MCV: 90.4 fL (ref 80.0–100.0)
MONO ABS: 744 {cells}/uL (ref 200–950)
MPV: 11 fL (ref 7.5–12.5)
Monocytes Relative: 12 %
NEUTROS ABS: 4030 {cells}/uL (ref 1500–7800)
Neutrophils Relative %: 65 %
Platelets: 300 10*3/uL (ref 140–400)
RBC: 4.18 MIL/uL (ref 3.80–5.10)
RDW: 14.3 % (ref 11.0–15.0)
WBC: 6.2 10*3/uL (ref 3.8–10.8)

## 2016-11-11 MED ORDER — PROMETHAZINE-DM 6.25-15 MG/5ML PO SYRP: ORAL_SOLUTION | ORAL | 0 refills | Status: DC

## 2016-11-11 MED ORDER — LEVOFLOXACIN 500 MG PO TABS: 500.0000 mg | ORAL_TABLET | Freq: Every day | ORAL | 0 refills | Status: DC

## 2016-11-11 MED ORDER — FLUCONAZOLE 150 MG PO TABS: 150.0000 mg | ORAL_TABLET | Freq: Once | ORAL | 0 refills | Status: AC

## 2016-11-11 MED ORDER — PREDNISONE 5 MG (21) PO TBPK: 5.0000 mg | ORAL_TABLET | ORAL | 0 refills | Status: DC

## 2016-11-11 MED ORDER — ALBUTEROL SULFATE (2.5 MG/3ML) 0.083% IN NEBU: 2.5000 mg | INHALATION_SOLUTION | Freq: Four times a day (QID) | RESPIRATORY_TRACT | 0 refills | Status: DC | PRN

## 2016-11-11 MED ORDER — FLUCONAZOLE 150 MG PO TABS: 150.0000 mg | ORAL_TABLET | Freq: Once | ORAL | 0 refills | Status: DC

## 2016-11-11 MED ORDER — BENZONATATE 100 MG PO CAPS: 100.0000 mg | ORAL_CAPSULE | Freq: Two times a day (BID) | ORAL | 0 refills | Status: DC | PRN

## 2016-11-11 MED ORDER — METHYLPREDNISOLONE ACETATE 80 MG/ML IJ SUSP
80.0000 mg | Freq: Once | INTRAMUSCULAR | Status: AC
  Administered 2016-11-11: 80 mg via INTRAMUSCULAR

## 2016-11-11 NOTE — Assessment & Plan Note (Signed)
Xray, labs , antibiotic, decongestant, cough suppressant

## 2016-11-11 NOTE — Assessment & Plan Note (Signed)
X ray, labs , antibiotic course

## 2016-11-11 NOTE — Assessment & Plan Note (Signed)
Updated lab needed at/ before next visit. Patient educated about the importance of limiting  Carbohydrate intake , the need to commit to daily physical activity for a minimum of 30 minutes , and to commit weight loss. The fact that changes in all these areas will reduce or eliminate all together the development of diabetes is stressed.   Diabetic Labs Latest Ref Rng & Units 08/18/2016 04/16/2016 02/17/2016 07/31/2015 05/22/2015  HbA1c <5.7 % - 5.9(H) - 6.1(H) -  Microalbumin 0.00 - 1.89 mg/dL - - - - -  Micro/Creat Ratio 0.0 - 30.0 mg/g - - - - -  Chol 125 - 200 mg/dL - - - 188 -  HDL >=46 mg/dL - - - 69 -  Calc LDL <130 mg/dL - - - 105 -  Triglycerides <150 mg/dL - - - 72 -  Creatinine 0.44 - 1.00 mg/dL 1.02(H) 0.93 0.91 0.87 0.88  GFR >60.00 mL/min - - - - -   BP/Weight 11/11/2016 10/17/2016 10/14/2016 10/08/2016 09/10/2016 08/21/2016 [id number]  Systolic BP [id number] [id number]  Diastolic BP 72 68 72 60 70 63 62  Wt. (Lbs) 168.04 - 165 - 157 161 -  BMI 27.12 - 26.63 - 25.34 25.99 -  Some encounter information is confidential and restricted. Go to Review Flowsheets activity to see all data.   No flowsheet data found.

## 2016-11-11 NOTE — Assessment & Plan Note (Signed)
Controlled, no change in medication  

## 2016-11-11 NOTE — Progress Notes (Signed)
   Jane English     MRN: [id number]      DOB: Jun 02, 1971   HPI Jane English  10 day h/o worsening head and chest congestion, associated with fever and chills intermittently. Nasal drainage has thickened , and is yellowish green, and at times bloody.Bloody nasal drainage witnessed at visit, also reports bloody sputum Sputum is thick and yellow. C/o bilateral ear pressure, denies hearing loss and sore throat. Increasing fatigue , poor appetitie and sleep disturbed by cough. No improvement with OTC medication.   ROS  Denies chest pains, palpitations and leg swelling Denies abdominal pain, nausea, vomiting,diarrhea or constipation.   Denies dysuria, frequency, hesitancy or incontinence. Denies joint pain, swelling and limitation in mobility. Denies headaches, seizures, numbness, or tingling. Denies depression, anxiety or insomnia. Denies skin break down or rash.   PE  BP 106/72   Pulse (!) 110   Temp 99.5 F (37.5 C)   Resp 18   Wt 168 lb 0.6 oz (76.2 kg)   SpO2 96%   BMI 27.12 kg/m   Patient alert and oriented and in no cardiopulmonary distress.Ill appearing  HEENT: No facial asymmetry, EOMI,   oropharynx pink and moist.  Neck supple, bilateral cervical adenitis, no JVD, no mass.Frontal and maxillary sinus tenderness, TM clear  Chest: adequate air entry bilateral crackles  CVS: S1, S2 no murmurs, no S3.Regular rate.  ABD: Soft non tender.   Ext: No edema  MS: Adequate ROM spine, shoulders, hips and knees.  Skin: Intact, no ulcerations or rash noted.  Psych: Good eye contact, normal affect. Memory intact not anxious or depressed appearing.  CNS: CN 2-12 intact, power,  normal throughout.no focal deficits noted.   Assessment & Plan  Acute bronchitis Xray, labs , antibiotic, decongestant, cough suppressant  Acute sinusitis X ray, labs , antibiotic course   Essential hypertension Controlled, no change in medication   Prediabetes Updated lab needed at/  before next visit. Patient educated about the importance of limiting  Carbohydrate intake , the need to commit to daily physical activity for a minimum of 30 minutes , and to commit weight loss. The fact that changes in all these areas will reduce or eliminate all together the development of diabetes is stressed.   Diabetic Labs Latest Ref Rng & Units 08/18/2016 04/16/2016 02/17/2016 07/31/2015 05/22/2015  HbA1c <5.7 % - 5.9(H) - 6.1(H) -  Microalbumin 0.00 - 1.89 mg/dL - - - - -  Micro/Creat Ratio 0.0 - 30.0 mg/g - - - - -  Chol 125 - 200 mg/dL - - - 188 -  HDL >=46 mg/dL - - - 69 -  Calc LDL <130 mg/dL - - - 105 -  Triglycerides <150 mg/dL - - - 72 -  Creatinine 0.44 - 1.00 mg/dL 1.02(H) 0.93 0.91 0.87 0.88  GFR >60.00 mL/min - - - - -   BP/Weight 11/11/2016 10/17/2016 10/14/2016 10/08/2016 09/10/2016 08/21/2016 [id number]  Systolic BP [id number]  Diastolic BP 72 68 72 60 70 63 62  Wt. (Lbs) 168.04 - 165 - 157 161 -  BMI 27.12 - 26.63 - 25.34 25.99 -  Some encounter information is confidential and restricted. Go to Review Flowsheets activity to see all data.   No flowsheet data found.

## 2016-11-11 NOTE — Patient Instructions (Addendum)
F/u in 1 month, . Call if you need me sooner  You are treated for acute sinusitis and bronchitis.  Levaquin sent in for 10 days and decongestant perles, also  Cough suppression syrup and prednisone does pack sent in  Depomedrol administered in office  CXR  And sinus X ray today, also CBC and diff, cmp and EGFR, hBA1C and TSH

## 2016-11-11 NOTE — Progress Notes (Signed)
PELVIC US TA/TV: Heterogeneous anteverted uterus w/ two post intramural fibroids,(#1) post 2.8 x 1.6 x 1.3 cm (#2)post right 2.8 x 1.3 x 1.5 cm,right oophorectomy,right adnexa wnl,normal left ov,no free fluid,no pain during ultrasound,ovary appear mobile

## 2016-11-12 ENCOUNTER — Other Ambulatory Visit: Payer: Self-pay

## 2016-11-12 ENCOUNTER — Ambulatory Visit (HOSPITAL_COMMUNITY): Payer: Self-pay | Admitting: Psychiatry

## 2016-11-12 ENCOUNTER — Encounter: Payer: Self-pay | Admitting: Family Medicine

## 2016-11-12 LAB — COMPLETE METABOLIC PANEL WITH GFR
ALT: 18 U/L (ref 6–29)
AST: 21 U/L (ref 10–35)
Albumin: 4.1 g/dL (ref 3.6–5.1)
Alkaline Phosphatase: 52 U/L (ref 33–115)
BUN: 13 mg/dL (ref 7–25)
CHLORIDE: 104 mmol/L (ref 98–110)
CO2: 23 mmol/L (ref 20–31)
CREATININE: 1.24 mg/dL — AB (ref 0.50–1.10)
Calcium: 8.9 mg/dL (ref 8.6–10.2)
GFR, EST AFRICAN AMERICAN: 60 mL/min (ref >=60)
GFR, Est Non African American: 52 mL/min — ABNORMAL LOW (ref >=60)
GLUCOSE: 90 mg/dL (ref 65–99)
Potassium: 3.9 mmol/L (ref 3.5–5.3)
SODIUM: 138 mmol/L (ref 135–146)
Total Bilirubin: 0.5 mg/dL (ref 0.2–1.2)
Total Protein: 7.1 g/dL (ref 6.1–8.1)

## 2016-11-12 LAB — HEMOGLOBIN A1C
HEMOGLOBIN A1C: 5.9 % — AB (ref <5.7)
MEAN PLASMA GLUCOSE: 123 mg/dL

## 2016-11-12 LAB — TSH: TSH: 0.85 mIU/L

## 2016-11-12 MED ORDER — BENZONATATE 100 MG PO CAPS: 100.0000 mg | ORAL_CAPSULE | Freq: Two times a day (BID) | ORAL | 0 refills | Status: DC | PRN

## 2016-11-13 ENCOUNTER — Ambulatory Visit: Payer: 59 | Admitting: Neurology

## 2016-11-18 ENCOUNTER — Telehealth (HOSPITAL_COMMUNITY): Payer: Self-pay | Admitting: *Deleted

## 2016-11-18 ENCOUNTER — Encounter: Payer: Self-pay | Admitting: Obstetrics and Gynecology

## 2016-11-18 ENCOUNTER — Ambulatory Visit (INDEPENDENT_AMBULATORY_CARE_PROVIDER_SITE_OTHER): Payer: 59 | Admitting: Obstetrics and Gynecology

## 2016-11-18 VITALS — BP 101/63 | HR 72 | Wt 164.6 lb

## 2016-11-18 DIAGNOSIS — R1030 Lower abdominal pain, unspecified: Secondary | ICD-10-CM | POA: Diagnosis not present

## 2016-11-18 DIAGNOSIS — N970 Female infertility associated with anovulation: Secondary | ICD-10-CM

## 2016-11-18 MED ORDER — MEDROXYPROGESTERONE ACETATE 10 MG PO TABS: 10.0000 mg | ORAL_TABLET | Freq: Every day | ORAL | 3 refills | Status: DC

## 2016-11-18 NOTE — Patient Instructions (Signed)
Secondary Amenorrhea Secondary amenorrhea is the stopping of menstrual flow for 3-6 months in a female who has previously had periods. There are many possible causes. Most of these causes are not serious. Usually, treating the underlying problem causing the loss of menses will return your periods to normal. What are the causes? Some common and uncommon causes of not menstruating include:  Malnutrition.  Low blood sugar (hypoglycemia).  Polycystic ovary disease.  Stress or fear.  Breastfeeding.  Hormone imbalance.  Ovarian failure.  Medicines.  Extreme obesity.  Cystic fibrosis.  Low body weight or drastic weight reduction from any cause.  Early menopause.  Removal of ovaries or uterus.  Contraceptives.  Illness.  Long-term (chronic) illnesses.  Cushing syndrome.  Thyroid problems.  Birth control pills, patches, or vaginal rings for birth control. What increases the risk? You may be at greater risk of secondary amenorrhea if:  You have a family history of this condition.  You have an eating disorder.  You do athletic training. How is this diagnosed? A diagnosis is made by your health care provider taking a medical history and doing a physical exam. This will include a pelvic exam to check for problems with your reproductive organs. Pregnancy must be ruled out. Often, numerous blood tests are done to measure different hormones in the body. Urine testing may be done. Specialized exams (ultrasound, CT scan, MRI, or hysteroscopy) may have to be done as well as measuring the body mass index (BMI). How is this treated? Treatment depends on the cause of the amenorrhea. If an eating disorder is present, this can be treated with an adequate diet and therapy. Chronic illnesses may improve with treatment of the illness. Amenorrhea may be corrected with medicines, lifestyle changes, or surgery. If the amenorrhea cannot be corrected, it is sometimes possible to create a false  menstruation with medicines. Follow these instructions at home:  Maintain a healthy diet.  Manage weight problems.  Exercise regularly but not excessively.  Get adequate sleep.  Manage stress.  Be aware of changes in your menstrual cycle. Keep a record of when your periods occur. Note the date your period starts, how long it lasts, and any problems. Contact a health care provider if: Your symptoms do not get better with treatment. This information is not intended to replace advice given to you by your health care provider. Make sure you discuss any questions you have with your health care provider. Document Released: 12/02/2006 Document Revised: 03/28/2016 Document Reviewed: 04/08/2013 Elsevier Interactive Patient Education  2017 [employer]  

## 2016-11-18 NOTE — Progress Notes (Signed)
Patient ID: Jane English, female   DOB: [birth year], 46 y.o.   MRN: [id number]   [facility] Visit  @DATE @            Patient name: Jane English MRN [id number]  Date of birth: 1971/01/28  CC & HPI:   Chief Complaint  Patient presents with  . Follow-up    u/s results     Jane English is a 46 y.o. female presenting today for f/u of pelvic and transvaginal US results on 11/11/16 d/t chronic RLQ abdominal pain s/p bowel resection. She is followed by GI Jane English and PCP Dr. Moshe English.   Pt states her last period was 6 months ago. She states she does not normally notice fluid retention prior to her periods.   ROS:  ROS No complaints, f/u of [place] results   Pertinent History Reviewed:   Reviewed: Significant for chronic abdominal pain, chronic constipation, DM2, GERD, SBO  Medical         Past Medical History:  Diagnosis Date  . Anemia   . Asthma   . Asthma flare 04/09/2013  . Back pain   . Bronchitis   . Chronic abdominal pain   . Chronic constipation   . Constipation due to opioid therapy   . Depression   . Diabetes mellitus without complication ([place])   . Diabetes mellitus, type II ([place])   . DVT (deep venous thrombosis) ([place]) 2010  . GERD (gastroesophageal reflux disease)   . Heart murmur    no cardiologist  . Helicobacter pylori gastritis 06/11/2013   Colonoscopy Jane English  . Hypertension   . Migraine headache   . Neuropathy ([place])   . Obesity   . Obsessive-compulsive disorder   . PSYCHOTIC D/O W/HALLUCINATIONS CONDS CLASS ELSW 03/04/2010   Qualifier: Diagnosis of  By: Jane Cipro MD, Jane English    . PTSD (post-traumatic stress disorder)   . SBO (small bowel obstruction) 08/09/2013  . Seasonal allergies 12/10/2012  . Seizures ([place])   . Shortness of breath                               Surgical Hx:    Past Surgical History:  Procedure Laterality Date  . ANTERIOR CERVICAL DECOMP/DISCECTOMY FUSION  07/07/2012   Procedure: ANTERIOR CERVICAL DECOMPRESSION/DISCECTOMY FUSION 2  LEVELS;  Surgeon: Jane Stakes, MD;  Location: [facility];  Service: Neurosurgery;  Laterality: N/A;  Cervical four-five, five - six  Anterior cervical decompression/diskectomy/fusion/plate  . APPENDECTOMY  1986  . BOWEL RESECTION N/A 07/29/2013   Procedure: serosal repair;  Surgeon: Jane Hector, MD;  Location: [facility];  Service: General;  Laterality: N/A;  . CARPAL TUNNEL RELEASE Bilateral   . LAPAROSCOPY N/A 07/29/2013   Procedure: diagnostic laporoscopy;  Surgeon: Jane Hector, MD;  Location: [facility];  Service: General;  Laterality: N/A;  . LAPAROSCOPY N/A 08/16/2013   Procedure: LAPAROSCOPY DIAGNOSTIC/LYSIS OF ADHESIONS;  Surgeon: Jane Hector, MD;  Location: [facility];  Service: General;  Laterality: N/A;  . LAPAROTOMY N/A 08/16/2013   Procedure: EXPLORATORY LAPAROTOMY/SMALL BOWEL RESECTION ([place]);  Surgeon: Jane Hector, MD;  Location: [facility];  Service: General;  Laterality: N/A;  . LUMBAR SPINE SURGERY  2010   x 3  . LYSIS OF ADHESION  2003   Dr. Irving English  . LYSIS OF ADHESION N/A 07/29/2013   Procedure: LYSIS OF ADHESION;  Surgeon: Jane Hector, MD;  Location: [facility];  Service: General;  Laterality: N/A;  . OOPHORECTOMY    . PARTIAL HYSTERECTOMY  1990s?   [place], [place]  . SPINAL CORD STIMULATOR IMPLANT    . TRIGGER FINGER RELEASE  2009   right pinkie finger  . TUBAL LIGATION  1994   Medications: Reviewed & Updated - see associated section                       Current Outpatient Prescriptions:  .  albuterol (PROVENTIL) (2.5 MG/3ML) 0.083% nebulizer solution, Take 3 mLs (2.5 mg total) by nebulization every 6 (six) hours as needed for wheezing or shortness of breath., Disp: 75 mL, Rfl: 0 .  ALPRAZolam (XANAX) 1 MG tablet, Take 1 tablet (1 mg total) by mouth 3 (three) times daily as needed for anxiety., Disp: 90 tablet, Rfl: 2 .  amLODipine (NORVASC) 10 MG tablet, Take 10 mg by mouth daily., Disp: , Rfl:  .  azelastine (ASTELIN) 0.1 % nasal spray, USE 2 SPRAYS  IN EACH NOSTRIL TWICE A DAY, Disp: 30 mL, Rfl: 5 .  beclomethasone (QVAR) 40 MCG/ACT inhaler, Inhale 2 puffs into the lungs 2 (two) times daily., Disp: 1 Inhaler, Rfl: 12 .  benzonatate (TESSALON) 100 MG capsule, Take 1 capsule (100 mg total) by mouth 2 (two) times daily as needed for cough., Disp: 20 capsule, Rfl: 0 .  Botulinum Toxin Type A 200 units SOLR, Inject 200 Units as directed every 3 (three) months., Disp: 1 each, Rfl: 3 .  carbamazepine (TEGRETOL) 100 MG chewable tablet, Chew 100 mg by mouth 2 (two) times daily., Disp: , Rfl:  .  cetirizine (ZYRTEC) 10 MG tablet, Take 10 mg by mouth daily., Disp: , Rfl:  .  cloNIDine (CATAPRES) 0.1 MG tablet, Take 0.1 mg by mouth at bedtime., Disp: , Rfl:  .  clotrimazole-betamethasone (LOTRISONE) cream, Apply 1 application topically 2 (two) times daily as needed (for rash)., Disp: 30 g, Rfl: 0 .  escitalopram (LEXAPRO) 20 MG tablet, Take 1 tablet (20 mg total) by mouth daily., Disp: 90 tablet, Rfl: 2 .  fluticasone (FLONASE) 50 MCG/ACT nasal spray, Place 2 sprays into both nostrils daily as needed for rhinitis., Disp: , Rfl:  .  ipratropium (ATROVENT) 0.02 % nebulizer solution, Take 0.5 mg by nebulization every 6 (six) hours as needed for wheezing or shortness of breath., Disp: , Rfl:  .  levofloxacin (LEVAQUIN) 500 MG tablet, Take 1 tablet (500 mg total) by mouth daily., Disp: 10 tablet, Rfl: 0 .  linaclotide (LINZESS) 290 MCG CAPS capsule, Take 1 capsule (290 mcg total) by mouth daily before breakfast., Disp: 30 capsule, Rfl: 4 .  loratadine (CLARITIN REDITABS) 10 MG dissolvable tablet, Take 1 tablet (10 mg total) by mouth daily., Disp: 30 tablet, Rfl: 2 .  loratadine (CLARITIN) 10 MG tablet, TAKE 1 TABLET (10 MG TOTAL) BY MOUTH DAILY., Disp: , Rfl: 2 .  montelukast (SINGULAIR) 10 MG tablet, TAKE 1 TABLET (10 MG TOTAL) BY MOUTH AT BEDTIME., Disp: 90 tablet, Rfl: 1 .  olopatadine (PATANOL) 0.1 % ophthalmic solution, Place 1 drop into both eyes 2 (two)  times daily., Disp: , Rfl:  .  Oxycodone HCl 10 MG TABS, Take 10 mg by mouth every 12 (twelve) hours as needed (for pain). , Disp: , Rfl: 0 .  pantoprazole (PROTONIX) 40 MG tablet, Take 1 tablet (40 mg total) by mouth daily., Disp: 90 tablet, Rfl: 1 .  potassium chloride 20 MEQ/15ML (10%) SOLN, Take  20 mEq by mouth 2 (two) times daily., Disp: , Rfl:  .  predniSONE (STERAPRED UNI-PAK 21 TAB) 5 MG (21) TBPK tablet, Take 1 tablet (5 mg total) by mouth as directed. Use as directed, Disp: 21 tablet, Rfl: 0 .  promethazine-dextromethorphan (PROMETHAZINE-DM) 6.25-15 MG/5ML syrup, One teaspoon at bedtime, as needed, for excessive cough, Disp: 180 mL, Rfl: 0 .  rizatriptan (MAXALT-MLT) 10 MG disintegrating tablet, Take 10 mg by mouth as needed for migraine. May repeat in 2 hours if needed, Disp: , Rfl:  .  spironolactone (ALDACTONE) 25 MG tablet, Take 25 mg by mouth daily., Disp: , Rfl:  .  SUMAtriptan (IMITREX) 6 MG/0.5ML SOLN injection, Inject 0.5 mLs (6 mg total) into the skin every 2 (two) hours as needed for migraine or headache. May repeat in 2 hours if headache persists or recurs., Disp: 12 vial, Rfl: 11 .  terbinafine (LAMISIL) 250 MG tablet, Take 1 tablet (250 mg total) by mouth daily., Disp: 42 tablet, Rfl: 1 .  topiramate (TOPAMAX) 50 MG tablet, Take 2 tablets (100 mg total) by mouth 2 (two) times daily., Disp: 120 tablet, Rfl: 11 .  traZODone (DESYREL) 50 MG tablet, Take 1 tablet (50 mg total) by mouth at bedtime., Disp: 30 tablet, Rfl: 2 .  triamterene-hydrochlorothiazide (MAXZIDE-25) 37.5-25 MG tablet, TAKE 1 TABLET BY MOUTH EVERY DAY (CHANGED TO TABLETS, CAPS ON BACKORDER) (Patient taking differently: TAKE 0.5 TABLET BY MOUTH EVERY DAY (CHANGED TO TABLETS, CAPS ON BACKORDER)), Disp: 30 tablet, Rfl: 1 .  vitamin B-12 (CYANOCOBALAMIN) 1000 MCG tablet, Take 1 tablet (1,000 mcg total) by mouth daily., Disp: 30 tablet, Rfl: 11 .  Zinc Oxide (TRIPLE PASTE) 12.8 % ointment, Apply 1 application topically  as needed for irritation., Disp: 56.7 g, Rfl: 0 .  diazepam (VALIUM) 5 MG tablet, Take 1 tablet (5 mg total) by mouth every 8 (eight) hours as needed for muscle spasms. (Patient not taking: Reported on 11/18/2016), Disp: 12 tablet, Rfl: 0 .  diclofenac sodium (VOLTAREN) 1 % GEL, Apply 4 g topically 4 (four) times daily as needed (for pain). , Disp: , Rfl: 5 .  Methylnaltrexone Bromide 150 MG TABS, Take 450 mg by mouth daily before breakfast., Disp: , Rfl:  .  oxyCODONE (OXY IR/ROXICODONE) 5 MG immediate release tablet, Take 10 mg by mouth every 8 (eight) hours as needed. for pain, Disp: , Rfl: 0 .  potassium chloride (K-DUR,KLOR-CON) 10 MEQ tablet, Take by mouth., Disp: , Rfl:    Social History: Reviewed -  reports that she has never smoked. She has never used smokeless tobacco.  Objective Findings:  Vitals: Blood pressure 101/63, pulse 72, weight 164 lb 9.6 oz (74.7 kg).  Physical Examination: Discussion only    Assessment & Plan:   A:  1. Transvaginal US very reassuring, chronic abdominal pain with no suspected GYN etiology. 2. Premenopausal status and anovulatory   P:  1. Rx 14 tablets10 mg Provera, to be taken 4x a year 2. F/u in 1 year     By signing my name below, I, Hansel Feinstein, attest that this documentation has been prepared under the direction and in the presence of Jonnie Kind, MD. Electronically Signed: Hansel Feinstein, ED Scribe. 11/18/16. 11:57 AM.  I personally performed the services described in this documentation, which was SCRIBED in my presence. The recorded information has been reviewed and considered accurate. It has been edited as necessary during review. Jonnie Kind, MD

## 2016-11-18 NOTE — Telephone Encounter (Signed)
returned phone call, patient thought she had an appointment 11/18/16.    She is scheduled on 12/19/16.   Left  voice message.

## 2016-11-21 ENCOUNTER — Other Ambulatory Visit: Payer: Self-pay | Admitting: Family Medicine

## 2016-11-24 ENCOUNTER — Other Ambulatory Visit: Payer: Self-pay | Admitting: Family Medicine

## 2016-12-02 ENCOUNTER — Other Ambulatory Visit (HOSPITAL_COMMUNITY): Payer: Self-pay | Admitting: Psychiatry

## 2016-12-16 ENCOUNTER — Encounter: Payer: Self-pay | Admitting: Family Medicine

## 2016-12-16 ENCOUNTER — Other Ambulatory Visit: Payer: Self-pay | Admitting: Family Medicine

## 2016-12-16 ENCOUNTER — Ambulatory Visit (INDEPENDENT_AMBULATORY_CARE_PROVIDER_SITE_OTHER): Payer: 59 | Admitting: Family Medicine

## 2016-12-16 ENCOUNTER — Other Ambulatory Visit (HOSPITAL_COMMUNITY): Payer: Self-pay | Admitting: Psychiatry

## 2016-12-16 ENCOUNTER — Other Ambulatory Visit: Payer: Self-pay | Admitting: Internal Medicine

## 2016-12-16 VITALS — BP 118/78 | HR 93 | Resp 15 | Ht 66.0 in | Wt 168.0 lb

## 2016-12-16 DIAGNOSIS — Z1231 Encounter for screening mammogram for malignant neoplasm of breast: Secondary | ICD-10-CM | POA: Diagnosis not present

## 2016-12-16 DIAGNOSIS — Z1239 Encounter for other screening for malignant neoplasm of breast: Secondary | ICD-10-CM

## 2016-12-16 DIAGNOSIS — J328 Other chronic sinusitis: Secondary | ICD-10-CM | POA: Diagnosis not present

## 2016-12-16 DIAGNOSIS — I10 Essential (primary) hypertension: Secondary | ICD-10-CM | POA: Diagnosis not present

## 2016-12-16 MED ORDER — PREDNISONE 5 MG PO TABS: 5.0000 mg | ORAL_TABLET | Freq: Two times a day (BID) | ORAL | 0 refills | Status: AC

## 2016-12-16 MED ORDER — PROMETHAZINE-DM 6.25-15 MG/5ML PO SYRP: ORAL_SOLUTION | ORAL | 0 refills | Status: DC

## 2016-12-16 NOTE — Patient Instructions (Addendum)
F/u in 4 month, call if you need me before  You are referred for sinus scan, you will be called with appointment  Start twice daily saline nasal flushes (OTC simply saline) to flush the excess mucus from your sinuses  Please submit sputum for testing to the lab in the morning  Please get your mammogram appt at checkout if possible or you will be called with appt info

## 2016-12-16 NOTE — Assessment & Plan Note (Addendum)
3 week h/o sinus pressure and green drainage with chills, cough at night, needs sputum C/S and sinus scan,start saline nasal flushes, will manage according to results

## 2016-12-18 ENCOUNTER — Encounter (HOSPITAL_COMMUNITY): Payer: Self-pay | Admitting: Psychiatry

## 2016-12-18 ENCOUNTER — Ambulatory Visit (INDEPENDENT_AMBULATORY_CARE_PROVIDER_SITE_OTHER): Payer: 59 | Admitting: Psychiatry

## 2016-12-18 VITALS — BP 96/60 | HR 68 | Ht 66.0 in | Wt 167.4 lb

## 2016-12-18 DIAGNOSIS — F4310 Post-traumatic stress disorder, unspecified: Secondary | ICD-10-CM

## 2016-12-18 DIAGNOSIS — F3175 Bipolar disorder, in partial remission, most recent episode depressed: Secondary | ICD-10-CM

## 2016-12-18 DIAGNOSIS — Z79899 Other long term (current) drug therapy: Secondary | ICD-10-CM | POA: Diagnosis not present

## 2016-12-18 DIAGNOSIS — R69 Illness, unspecified: Secondary | ICD-10-CM | POA: Diagnosis not present

## 2016-12-18 DIAGNOSIS — Z79891 Long term (current) use of opiate analgesic: Secondary | ICD-10-CM | POA: Diagnosis not present

## 2016-12-18 MED ORDER — ALPRAZOLAM 1 MG PO TABS: 1.0000 mg | ORAL_TABLET | Freq: Three times a day (TID) | ORAL | 2 refills | Status: DC | PRN

## 2016-12-18 MED ORDER — ESCITALOPRAM OXALATE 20 MG PO TABS: 20.0000 mg | ORAL_TABLET | Freq: Every day | ORAL | 2 refills | Status: DC

## 2016-12-18 MED ORDER — TRAZODONE HCL 50 MG PO TABS: 50.0000 mg | ORAL_TABLET | Freq: Every day | ORAL | 2 refills | Status: DC
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
## 2016-12-19 ENCOUNTER — Ambulatory Visit (INDEPENDENT_AMBULATORY_CARE_PROVIDER_SITE_OTHER): Payer: 59 | Admitting: Psychiatry

## 2016-12-19 DIAGNOSIS — F3175 Bipolar disorder, in partial remission, most recent episode depressed: Secondary | ICD-10-CM | POA: Diagnosis not present

## 2016-12-19 DIAGNOSIS — R69 Illness, unspecified: Secondary | ICD-10-CM | POA: Diagnosis not present

## 2016-12-19 LAB — RESPIRATORY CULTURE OR RESPIRATORY AND SPUTUM CULTURE: Organism ID, Bacteria: NORMAL
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
## 2016-12-20 ENCOUNTER — Encounter: Payer: Self-pay | Admitting: Family Medicine

## 2016-12-20 NOTE — Progress Notes (Signed)
   Jane English     MRN: [id number]      DOB: 1971-10-01   HPI Jane English is here c/o sinus pressure, green malodorous sinus drainage and intermittent chills since past 6 weeks. Was treated earlier in the month for respiratory infection, symptoms cleared but returned ROS Denies abdominal pain, nausea, vomiting,diarrhea or constipation.   Denies dysuria, frequency, hesitancy or incontinence. Denies joint pain, swelling and limitation in mobility. Denies headaches, seizures, numbness, or tingling. c/o depression, anxiety or insomnia.Treated by psych Denies skin break down or rash.   PE  BP 118/78   Pulse 93   Resp 15   Ht 5\' 6"  (1.676 m)   Wt 168 lb (76.2 kg)   SpO2 97%   BMI 27.12 kg/m   Patient alert and oriented and in no cardiopulmonary distress.  HEENT: No facial asymmetry, EOMI,   oropharynx pink and moist.  Neck supple no JVD, no mass.Maxillary sinus tenderness, TM clear  Chest: Clear to auscultation bilaterally.  CVS: S1, S2 no murmurs, no S3.Regular rate.  ABD: Soft non tender.   Ext: No edema  MS: Adequate ROM spine, shoulders, hips and knees.  Skin: Intact, no ulcerations or rash noted.  Psych: Good eye contact, normal affect. Memory intact not anxious or depressed appearing.  CNS: CN 2-12 intact, power,  normal throughout.no focal deficits noted.   Assessment & Plan  Chronic sinusitis 3 week h/o sinus pressure and green drainage with chills, cough at night, needs sputum C/S and sinus scan,start saline nasal flushes, will manage according to results  Essential hypertension Controlled, no change in medication DASH diet and commitment to daily physical activity for a minimum of 30 minutes discussed and encouraged, as a part of hypertension management. The importance of attaining a healthy weight is also discussed.  BP/Weight 12/16/2016 11/18/2016 11/11/2016 10/17/2016 10/14/2016 10/08/2016 [id number]  Systolic BP 123456 [id number] 123456 [id number]  Diastolic BP 78  63 72 68 72 60 70  Wt. (Lbs) 168 164.6 168.04 - 165 - 157  BMI 27.12 26.57 27.12 - 26.63 - 25.34  Some encounter information is confidential and restricted. Go to Review Flowsheets activity to see all data.

## 2016-12-20 NOTE — Assessment & Plan Note (Signed)
Controlled, no change in medication DASH diet and commitment to daily physical activity for a minimum of 30 minutes discussed and encouraged, as a part of hypertension management. The importance of attaining a healthy weight is also discussed.  BP/Weight 12/16/2016 11/18/2016 11/11/2016 10/17/2016 10/14/2016 10/08/2016 [id number]  Systolic BP 123456 [id number] 123456 [id number]  Diastolic BP 78 63 72 68 72 60 70  Wt. (Lbs) 168 164.6 168.04 - 165 - 157  BMI 27.12 26.57 27.12 - 26.63 - 25.34  Some encounter information is confidential and restricted. Go to Review Flowsheets activity to see all data.

## 2016-12-23 ENCOUNTER — Telehealth: Payer: Self-pay

## 2016-12-23 ENCOUNTER — Ambulatory Visit (HOSPITAL_COMMUNITY): Payer: Self-pay | Admitting: Psychiatry

## 2016-12-23 NOTE — Telephone Encounter (Signed)
She just was here 7 d ago and got levaquin, prednisone, tessalon and promethazine.  I cannot think of other help.  Can refer to Dr Moshe Cipro tomorrow

## 2016-12-23 NOTE — Telephone Encounter (Signed)
Cough that started last month, got better then came back again. Very hard deep cough to the point where she is passing urine. Sometimes its dry, occasionally producing white mucus. Very hoarse. Does have asthma and has been using her inhalers. Wants something else for it because its driving her crazy. Also wants to know if she would benefit from a humidifier?

## 2016-12-24 ENCOUNTER — Other Ambulatory Visit: Payer: Self-pay | Admitting: Family Medicine

## 2016-12-24 DIAGNOSIS — J4520 Mild intermittent asthma, uncomplicated: Secondary | ICD-10-CM

## 2016-12-24 DIAGNOSIS — R053 Chronic cough: Secondary | ICD-10-CM

## 2016-12-24 DIAGNOSIS — R05 Cough: Secondary | ICD-10-CM

## 2016-12-24 MED ORDER — PREDNISONE 5 MG (21) PO TBPK: 5.0000 mg | ORAL_TABLET | ORAL | 0 refills | Status: DC

## 2016-12-24 NOTE — Telephone Encounter (Signed)
Patient called two times today. Very hoarse, can hardly talk and still coughing very hard. Requesting more prednisone.

## 2016-12-24 NOTE — Telephone Encounter (Signed)
Spoke directly with Jane English today, she is being referred to Dr Luan Pulling for eval of chronic cough, has a dx of asthma, and she has a prednisone dose pack prescribed, her cT sinus scan is scheduled for tomorrow

## 2016-12-24 NOTE — Progress Notes (Unsigned)
pred 

## 2016-12-25 ENCOUNTER — Encounter: Payer: Self-pay | Admitting: Neurology

## 2016-12-25 ENCOUNTER — Ambulatory Visit (INDEPENDENT_AMBULATORY_CARE_PROVIDER_SITE_OTHER): Payer: 59 | Admitting: Neurology

## 2016-12-25 ENCOUNTER — Ambulatory Visit (HOSPITAL_COMMUNITY)
Admission: RE | Admit: 2016-12-25 | Discharge: 2016-12-25 | Disposition: A | Discharge status: Home / Self Care | Payer: 59 | Source: Ambulatory Visit | Attending: Family Medicine | Admitting: Family Medicine

## 2016-12-25 ENCOUNTER — Telehealth: Payer: Self-pay | Admitting: Neurology

## 2016-12-25 VITALS — BP 102/67 | HR 67 | Ht 66.0 in | Wt 167.0 lb

## 2016-12-25 DIAGNOSIS — G43719 Chronic migraine without aura, intractable, without status migrainosus: Secondary | ICD-10-CM

## 2016-12-25 DIAGNOSIS — J329 Chronic sinusitis, unspecified: Secondary | ICD-10-CM | POA: Diagnosis not present

## 2016-12-25 DIAGNOSIS — R05 Cough: Secondary | ICD-10-CM | POA: Diagnosis not present

## 2016-12-25 DIAGNOSIS — J342 Deviated nasal septum: Secondary | ICD-10-CM | POA: Diagnosis not present

## 2016-12-25 DIAGNOSIS — J328 Other chronic sinusitis: Secondary | ICD-10-CM | POA: Diagnosis present

## 2016-12-25 NOTE — Progress Notes (Signed)
**  Botox 100 units x 2 vials, NDC [id number], Lot # [id number], Exp 04/2019, specialty pharmacy.//mck,rn**
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
## 2016-12-25 NOTE — Telephone Encounter (Signed)
Pt needs 3 month botox for migraines .

## 2016-12-26 ENCOUNTER — Encounter: Payer: Self-pay | Admitting: Family Medicine

## 2016-12-27 NOTE — Telephone Encounter (Signed)
I called the patient to schedule her next injection. She asked if she could call me back at a later time because she wasn't fully awake yet.

## 2017-01-03 ENCOUNTER — Other Ambulatory Visit: Payer: Self-pay | Admitting: Family Medicine

## 2017-01-04 ENCOUNTER — Other Ambulatory Visit (HOSPITAL_COMMUNITY): Payer: Self-pay | Admitting: Psychiatry

## 2017-01-07 ENCOUNTER — Other Ambulatory Visit: Payer: Self-pay

## 2017-01-07 MED ORDER — BECLOMETHASONE DIPROP HFA 40 MCG/ACT IN AERB: 2.0000 | INHALATION_SPRAY | Freq: Two times a day (BID) | RESPIRATORY_TRACT | 5 refills | Status: DC

## 2017-01-14 ENCOUNTER — Other Ambulatory Visit: Payer: Self-pay

## 2017-01-14 ENCOUNTER — Encounter (HOSPITAL_COMMUNITY): Payer: Self-pay | Admitting: Psychiatry

## 2017-01-14 ENCOUNTER — Ambulatory Visit (INDEPENDENT_AMBULATORY_CARE_PROVIDER_SITE_OTHER): Payer: 59 | Admitting: Psychiatry

## 2017-01-14 DIAGNOSIS — F3175 Bipolar disorder, in partial remission, most recent episode depressed: Secondary | ICD-10-CM | POA: Diagnosis not present
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
## 2017-01-18 ENCOUNTER — Other Ambulatory Visit (HOSPITAL_COMMUNITY): Payer: Self-pay | Admitting: Psychiatry

## 2017-01-23 ENCOUNTER — Other Ambulatory Visit: Payer: Self-pay | Admitting: Internal Medicine

## 2017-02-05 ENCOUNTER — Telehealth (HOSPITAL_COMMUNITY): Payer: Self-pay | Admitting: *Deleted

## 2017-02-05 NOTE — Telephone Encounter (Signed)
Pt called stating that [facility] is stating they don't have refills for her Trazodone. Per pt she said that the pharmacy have requested her refills and was informed that she still have refills on her script but she do not have any more refills. Asked pt which pharmacy does she use because per chart, her Trazodone was sent to [facility]. Per pt she do not use [facility], [facility] only [facility]. Called [facility] and asked them if pt medication was there and they stated the have them on hold. Staff asked if pt medication could be transferred to [facility] [place] and Gans stated the receiving pharmacy have to call them requesting medications. [facility] and [facility] the pharmacist and he stated he will call [facility] to get pt medications transferred. Called pt and informed her what was going on with her medications and apologized to her for the mix up and office will change her pharmacy back [facility]. Pt verbalized understanding.

## 2017-02-06 ENCOUNTER — Encounter (HOSPITAL_COMMUNITY): Payer: Self-pay | Admitting: Psychiatry

## 2017-02-06 ENCOUNTER — Ambulatory Visit (INDEPENDENT_AMBULATORY_CARE_PROVIDER_SITE_OTHER): Payer: 59 | Admitting: Psychiatry

## 2017-02-06 DIAGNOSIS — F3175 Bipolar disorder, in partial remission, most recent episode depressed: Secondary | ICD-10-CM | POA: Diagnosis not present

## 2017-02-06 DIAGNOSIS — R69 Illness, unspecified: Secondary | ICD-10-CM | POA: Diagnosis not present
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
## 2017-02-10 DIAGNOSIS — R69 Illness, unspecified: Secondary | ICD-10-CM | POA: Diagnosis not present

## 2017-02-27 ENCOUNTER — Encounter (HOSPITAL_COMMUNITY): Payer: Self-pay | Admitting: Psychiatry

## 2017-02-27 ENCOUNTER — Ambulatory Visit (INDEPENDENT_AMBULATORY_CARE_PROVIDER_SITE_OTHER): Payer: 59 | Admitting: Psychiatry

## 2017-02-27 DIAGNOSIS — R69 Illness, unspecified: Secondary | ICD-10-CM | POA: Diagnosis not present

## 2017-02-27 DIAGNOSIS — F3175 Bipolar disorder, in partial remission, most recent episode depressed: Secondary | ICD-10-CM | POA: Diagnosis not present
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
## 2017-02-28 ENCOUNTER — Other Ambulatory Visit: Payer: Self-pay | Admitting: Internal Medicine

## 2017-03-05 ENCOUNTER — Ambulatory Visit (INDEPENDENT_AMBULATORY_CARE_PROVIDER_SITE_OTHER): Payer: Medicare HMO

## 2017-03-05 VITALS — BP 110/64 | HR 77 | Temp 98.6°F | Ht 67.0 in | Wt 170.0 lb

## 2017-03-05 DIAGNOSIS — Z0000 Encounter for general adult medical examination without abnormal findings: Secondary | ICD-10-CM | POA: Diagnosis not present

## 2017-03-05 NOTE — Progress Notes (Signed)
Subjective:   Jane English is a 46 y.o. female who presents for Medicare Annual (Subsequent) preventive examination.  Review of Systems:  Cardiac Risk Factors include: hypertension;sedentary lifestyle     Objective:     Vitals: BP 110/64   Pulse 77   Temp 98.6 F (37 C) (Oral)   Ht 5\' 7"  (1.702 m)   Wt 170 lb 0.6 oz (77.1 kg)   SpO2 97%   BMI 26.63 kg/m   Body mass index is 26.63 kg/m.   Tobacco History  Smoking Status  . Never Smoker  Smokeless Tobacco  . Never Used     Counseling given: Not Answered   Past Medical History:  Diagnosis Date  . Anemia   . Asthma   . Asthma flare 04/09/2013  . Back pain   . Bronchitis   . Chronic abdominal pain   . Chronic constipation   . Constipation due to opioid therapy   . Depression   . Diabetes mellitus without complication ([place])   . Diabetes mellitus, type II ([place])   . DVT (deep venous thrombosis) ([place]) 2010  . GERD (gastroesophageal reflux disease)   . Heart murmur    no cardiologist  . Helicobacter pylori gastritis 06/11/2013   Colonoscopy Dr. Hilarie Fredrickson  . Hypertension   . Migraine headache   . Neuropathy   . Obesity   . Obsessive-compulsive disorder   . PSYCHOTIC D/O W/HALLUCINATIONS CONDS CLASS ELSW 03/04/2010   Qualifier: Diagnosis of  By: Moshe Cipro MD, Joycelyn Schmid    . PTSD (post-traumatic stress disorder)   . SBO (small bowel obstruction) ([place]) 08/09/2013  . Seasonal allergies 12/10/2012  . Seizures ([place])   . Shortness of breath    Past Surgical History:  Procedure Laterality Date  . ANTERIOR CERVICAL DECOMP/DISCECTOMY FUSION  07/07/2012   Procedure: ANTERIOR CERVICAL DECOMPRESSION/DISCECTOMY FUSION 2 LEVELS;  Surgeon: Floyce Stakes, MD;  Location: [facility];  Service: Neurosurgery;  Laterality: N/A;  Cervical four-five, five - six  Anterior cervical decompression/diskectomy/fusion/plate  . APPENDECTOMY  1986  . BOWEL RESECTION N/A 07/29/2013   Procedure: serosal repair;  Surgeon: Adin Hector, MD;   Location: [facility];  Service: General;  Laterality: N/A;  . CARPAL TUNNEL RELEASE Bilateral   . LAPAROSCOPY N/A 07/29/2013   Procedure: diagnostic laporoscopy;  Surgeon: Adin Hector, MD;  Location: [facility];  Service: General;  Laterality: N/A;  . LAPAROSCOPY N/A 08/16/2013   Procedure: LAPAROSCOPY DIAGNOSTIC/LYSIS OF ADHESIONS;  Surgeon: Adin Hector, MD;  Location: [facility];  Service: General;  Laterality: N/A;  . LAPAROTOMY N/A 08/16/2013   Procedure: EXPLORATORY LAPAROTOMY/SMALL BOWEL RESECTION (JEJUNUM);  Surgeon: Adin Hector, MD;  Location: [facility];  Service: General;  Laterality: N/A;  . LUMBAR SPINE SURGERY  2010   x 3  . LYSIS OF ADHESION  2003   Dr. Irving Shows  . LYSIS OF ADHESION N/A 07/29/2013   Procedure: LYSIS OF ADHESION;  Surgeon: Adin Hector, MD;  Location: [facility];  Service: General;  Laterality: N/A;  . OOPHORECTOMY    . PARTIAL HYSTERECTOMY  1990s?   [place]  . SPINAL CORD STIMULATOR IMPLANT    . TRIGGER FINGER RELEASE  2009   right pinkie finger  . TUBAL LIGATION  1994   Family History  Problem Relation Age of Onset  . Lung cancer Father   . Stomach cancer Father   . Esophageal cancer Father   . Alcohol abuse Father   . Mental illness Father   .  Diabetes Sister   . Hypertension Sister   . Bipolar disorder Sister   . Schizophrenia Sister   . Diabetes Sister   . Alcohol abuse Brother   . Hypertension Brother   . Kidney disease Brother   . Diabetes Brother   . Drug abuse Brother   . Mental illness Brother   . Alcohol abuse Brother   . Alcohol abuse Brother   . Hypertension Brother   . Diabetes Brother   . Alcohol abuse Brother   . Physical abuse Mother   . Alcohol abuse Mother   . Cirrhosis Mother   . Mental illness Brother     in [place]  . Drug abuse Sister   . HIV Sister   . Alcohol abuse Brother   . Pneumonia Sister     died as a baby  . Alcohol abuse Brother   . Bipolar disorder Brother   . Bipolar disorder Daughter   .  Bipolar disorder Son   . Bipolar disorder Son   . Hypertension Brother   . Bipolar disorder Brother   . Drug abuse Brother   . Alcohol abuse Brother   . Bipolar disorder Brother   . ADD / ADHD Neg Hx   . Anxiety disorder Neg Hx   . Dementia Neg Hx   . Depression Neg Hx   . OCD Neg Hx   . Seizures Neg Hx   . Paranoid behavior Neg Hx   . Colon cancer Neg Hx    History  Sexual Activity  . Sexual activity: Yes  . Birth control/ protection: Post-menopausal    Outpatient Encounter Prescriptions as of 03/05/2017  Medication Sig  . albuterol (PROVENTIL) (2.5 MG/3ML) 0.083% nebulizer solution Take 3 mLs (2.5 mg total) by nebulization every 6 (six) hours as needed for wheezing or shortness of breath.  . ALPRAZolam (XANAX) 1 MG tablet Take 1 tablet (1 mg total) by mouth 3 (three) times daily as needed for anxiety.  Marland Kitchen amLODipine (NORVASC) 10 MG tablet Take 10 mg by mouth daily.  Marland Kitchen azelastine (ASTELIN) 0.1 % nasal spray USE 2 SPRAYS IN EACH NOSTRIL TWICE A DAY  . Beclomethasone Diprop HFA (QVAR REDIHALER) 40 MCG/ACT AERB Inhale 2 puffs into the lungs 2 (two) times daily.  . Botulinum Toxin Type A 200 units SOLR Inject 200 Units as directed every 3 (three) months.  . cetirizine (ZYRTEC) 10 MG tablet Take 10 mg by mouth daily.  . cholecalciferol (VITAMIN D) 1000 units tablet Take 1,000 Units by mouth daily.  . cloNIDine (CATAPRES) 0.1 MG tablet TAKE 1 TABLET AT BEDTIME  . clotrimazole-betamethasone (LOTRISONE) cream Apply 1 application topically 2 (two) times daily as needed (for rash).  Marland Kitchen escitalopram (LEXAPRO) 20 MG tablet Take 1 tablet (20 mg total) by mouth daily.  . fluticasone (FLONASE) 50 MCG/ACT nasal spray Place 2 sprays into both nostrils daily as needed for rhinitis.  Marland Kitchen ipratropium (ATROVENT) 0.02 % nebulizer solution Take 0.5 mg by nebulization every 6 (six) hours as needed for wheezing or shortness of breath.  . linaclotide (LINZESS) 290 MCG CAPS capsule Take 1 capsule (290 mcg  total) by mouth daily before breakfast.  . loratadine (CLARITIN) 10 MG tablet TAKE 1 TABLET (10 MG TOTAL) BY MOUTH DAILY.  . medroxyPROGESTERone (PROVERA) 10 MG tablet Take 1 tablet (10 mg total) by mouth daily. One tablet daily x 2 weeks. Repeat as directed  . montelukast (SINGULAIR) 10 MG tablet TAKE 1 TABLET (10 MG TOTAL) BY MOUTH AT BEDTIME.  Marland Kitchen  olopatadine (PATANOL) 0.1 % ophthalmic solution Place 1 drop into both eyes 2 (two) times daily.  . ondansetron (ZOFRAN-ODT) 4 MG disintegrating tablet Take 1 tablet by mouth every 12 (twelve) hours as needed for nausea/vomiting.  . Oxycodone HCl 10 MG TABS Take 10 mg by mouth every 12 (twelve) hours as needed (for pain).   . potassium chloride 20 MEQ/15ML (10%) SOLN Take 20 mEq by mouth 2 (two) times daily.  . rizatriptan (MAXALT-MLT) 10 MG disintegrating tablet Take 10 mg by mouth as needed for migraine. May repeat in 2 hours if needed  . spironolactone (ALDACTONE) 25 MG tablet TAKE 1 TABLET BY MOUTH EVERY DAY  . SUMAtriptan (IMITREX) 6 MG/0.5ML SOLN injection Inject 0.5 mLs (6 mg total) into the skin every 2 (two) hours as needed for migraine or headache. May repeat in 2 hours if headache persists or recurs.  . terbinafine (LAMISIL) 250 MG tablet Take 1 tablet (250 mg total) by mouth daily.  Marland Kitchen topiramate (TOPAMAX) 50 MG tablet Take 2 tablets (100 mg total) by mouth 2 (two) times daily.  . traZODone (DESYREL) 50 MG tablet Take 1 tablet (50 mg total) by mouth at bedtime.  . triamterene-hydrochlorothiazide (MAXZIDE-25) 37.5-25 MG tablet TAKE 1 TABLET BY MOUTH EVERY DAY (CHANGED TO TABLETS, CAPS ON BACKORDER) (Patient taking differently: TAKE 1/2 TABLET BY MOUTH EVERY DAY (CHANGED TO TABLETS, CAPS ON BACKORDER))  . [DISCONTINUED] beclomethasone (QVAR) 40 MCG/ACT inhaler Inhale 2 puffs into the lungs 2 (two) times daily.  . diclofenac sodium (VOLTAREN) 1 % GEL Apply 4 g topically 4 (four) times daily as needed (for pain).   . [DISCONTINUED] cloNIDine  (CATAPRES) 0.1 MG tablet Take 0.1 mg by mouth at bedtime.  . [DISCONTINUED] Methylnaltrexone Bromide 150 MG TABS Take 450 mg by mouth daily before breakfast.  . [DISCONTINUED] predniSONE (STERAPRED UNI-PAK 21 TAB) 5 MG (21) TBPK tablet Take 1 tablet (5 mg total) by mouth as directed. Use as directed  . [DISCONTINUED] promethazine-dextromethorphan (PROMETHAZINE-DM) 6.25-15 MG/5ML syrup TAKE 1 TEASPOONFUL BY MOUTH AT BEDTIME AS NEEDED FOR EXCESSIVE COUGH  . [DISCONTINUED] vitamin B-12 (CYANOCOBALAMIN) 1000 MCG tablet Take 1 tablet (1,000 mcg total) by mouth daily.  . [DISCONTINUED] Zinc Oxide (TRIPLE PASTE) 12.8 % ointment Apply 1 application topically as needed for irritation. (Patient not taking: Reported on 03/05/2017)   No facility-administered encounter medications on file as of 03/05/2017.     Activities of Daily Living In your present state of health, do you have any difficulty performing the following activities: 03/05/2017  Hearing? N  Vision? Y  Difficulty concentrating or making decisions? N  Walking or climbing stairs? Y  Dressing or bathing? N  Doing errands, shopping? N  Preparing Food and eating ? N  Using the Toilet? N  In the past six months, have you accidently leaked urine? N  Do you have problems with loss of bowel control? N  Managing your Medications? N  Managing your Finances? N  Housekeeping or managing your Housekeeping? N  Some recent data might be hidden    Patient Care Team: Fayrene Helper, MD as PCP - General Jerene Bears, MD as Consulting Physician (Gastroenterology) Cloria Spring, MD as Consulting Physician ([place]) Serita Grit, PA-C as Physician Assistant (Physician Assistant)    Assessment:    Exercise Activities and Dietary recommendations Current Exercise Habits: The patient does not participate in regular exercise at present, Exercise limited by: orthopedic condition(s)  Goals    . Exercise 3x per week (  30 min per time)           Recommend starting a routine exercise program at least 3 days a week for 30-45 minutes at a time as tolerated.        Fall Risk Fall Risk  03/05/2017 03/25/2016  Falls in the past year? Yes Yes  Number falls in past yr: 1 2 or more  Injury with Fall? No -  Risk for fall due to : History of fall(s) -  Follow up Education provided;Falls prevention discussed -   Depression Screen PHQ 2/9 Scores 03/05/2017 10/29/2012  PHQ - 2 Score 2 6  PHQ- 9 Score 5 24     Cognitive Function: Normal   6CIT Screen 03/05/2017  What Year? 0 points  What month? 0 points  What time? 0 points  Count back from 20 0 points  Months in reverse 0 points  Repeat phrase 0 points  Total Score 0    Immunization History  Administered Date(s) Administered  . Influenza Split 09/03/2012  . Influenza Whole 07/28/2007, 08/11/2009, 07/19/2010  . Influenza,inj,Quad PF,36+ Mos 09/13/2013, 08/12/2014, 08/01/2015, 07/24/2016  . PPD Test 02/22/2011, 06/26/2011, 09/18/2012, 11/29/2013, 12/06/2014  . Td 10/03/2005   Screening Tests Health Maintenance  Topic Date Due  . TETANUS/TDAP  05/21/2017 (Originally 10/04/2015)  . PNEUMOCOCCAL POLYSACCHARIDE VACCINE (1) 11/18/2018 (Originally 02/17/1973)  . HEMOGLOBIN A1C  05/11/2017  . INFLUENZA VACCINE  06/04/2017  . PAP SMEAR  09/11/2019  . HIV Screening  Completed      Plan:    I have personally reviewed and noted the following in the patient's chart:   . Medical and social history . Use of alcohol, tobacco or illicit drugs  . Current medications and supplements . Functional ability and status . Nutritional status . Physical activity . Advanced directives . List of other physicians . Hospitalizations, surgeries, and ER visits in previous 12 months . Vitals . Screenings to include cognitive, depression, and falls . Referrals and appointments  In addition, I have reviewed and discussed with patient certain preventive protocols, quality metrics, and best  practice recommendations. A written personalized care plan for preventive services as well as general preventive health recommendations were provided to patient.     Stormy Fabian, LPN  01/10/9372

## 2017-03-05 NOTE — Patient Instructions (Addendum)
Jane English , Thank you for taking time to come for your Medicare Wellness Visit. I appreciate your ongoing commitment to your health goals. Please review the following plan we discussed and let me know if I can assist you in the future.   Screening recommendations/referrals: Colonoscopy: Up to date, next due 02/2023 Mammogram: Up to date, next due 03/2019 Bone Density: Due at age 48 Recommended yearly ophthalmology/optometry visit for glaucoma screening and checkup Recommended yearly dental visit for hygiene and checkup  Vaccinations: Influenza vaccine: Up to date, next due 06/2017 Pneumococcal vaccine: Due at age 14 Tdap vaccine: Due, declines Shingles vaccine: Due at age 42    Advanced directives: Advance directive discussed with you today. I have provided a copy for you to complete at home and have notarized. Once this is complete please bring a copy in to our office so we can scan it into your chart.  Next appointment: Follow up with Dr. Moshe Cipro on 04/15/2017 at 10:20 am. Follow up in 1 year for your annual wellness visit.  Preventive Care 40-64 Years, Female Preventive care refers to lifestyle choices and visits with your health care provider that can promote health and wellness. What does preventive care include?  A yearly physical exam. This is also called an annual well check.  Dental exams once or twice a year.  Routine eye exams. Ask your health care provider how often you should have your eyes checked.  Personal lifestyle choices, including:  Daily care of your teeth and gums.  Regular physical activity.  Eating a healthy diet.  Avoiding tobacco and drug use.  Limiting alcohol use.  Practicing safe sex.  Taking low-dose aspirin daily starting at age 77.  Taking vitamin and mineral supplements as recommended by your health care provider. What happens during an annual well check? The services and screenings done by your health care provider during your annual well  check will depend on your age, overall health, lifestyle risk factors, and family history of disease. Counseling  Your health care provider may ask you questions about your:  Alcohol use.  Tobacco use.  Drug use.  Emotional well-being.  Home and relationship well-being.  Sexual activity.  Eating habits.  Work and work Statistician.  Method of birth control.  Menstrual cycle.  Pregnancy history. Screening  You may have the following tests or measurements:  Height, weight, and BMI.  Blood pressure.  Lipid and cholesterol levels. These may be checked every 5 years, or more frequently if you are over 57 years old.  Skin check.  Lung cancer screening. You may have this screening every year starting at age 25 if you have a 30-pack-year history of smoking and currently smoke or have quit within the past 15 years.  Fecal occult blood test (FOBT) of the stool. You may have this test every year starting at age 39.  Flexible sigmoidoscopy or colonoscopy. You may have a sigmoidoscopy every 5 years or a colonoscopy every 10 years starting at age 28.  Hepatitis C blood test.  Hepatitis B blood test.  Sexually transmitted disease (STD) testing.  Diabetes screening. This is done by checking your blood sugar (glucose) after you have not eaten for a while (fasting). You may have this done every 1-3 years.  Mammogram. This may be done every 1-2 years. Talk to your health care provider about when you should start having regular mammograms. This may depend on whether you have a family history of breast cancer.  BRCA-related cancer screening. This may  be done if you have a family history of breast, ovarian, tubal, or peritoneal cancers.  Pelvic exam and Pap test. This may be done every 3 years starting at age 38. Starting at age 72, this may be done every 5 years if you have a Pap test in combination with an HPV test.  Bone density scan. This is done to screen for osteoporosis. You  may have this scan if you are at high risk for osteoporosis. Discuss your test results, treatment options, and if necessary, the need for more tests with your health care provider. Vaccines  Your health care provider may recommend certain vaccines, such as:  Influenza vaccine. This is recommended every year.  Tetanus, diphtheria, and acellular pertussis (Tdap, Td) vaccine. You may need a Td booster every 10 years.  Zoster vaccine. You may need this after age 10.  Pneumococcal 13-valent conjugate (PCV13) vaccine. You may need this if you have certain conditions and were not previously vaccinated.  Pneumococcal polysaccharide (PPSV23) vaccine. You may need one or two doses if you smoke cigarettes or if you have certain conditions. Talk to your health care provider about which screenings and vaccines you need and how often you need them. This information is not intended to replace advice given to you by your health care provider. Make sure you discuss any questions you have with your health care provider. Document Released: 11/17/2015 Document Revised: 07/10/2016 Document Reviewed: 08/22/2015 Elsevier Interactive Patient Education  2017 [place] Prevention in the Home Falls can cause injuries. They can happen to people of all ages. There are many things you can do to make your home safe and to help prevent falls. What can I do on the outside of my home?  Regularly fix the edges of walkways and driveways and fix any cracks.  Remove anything that might make you trip as you walk through a door, such as a raised step or threshold.  Trim any bushes or trees on the path to your home.  Use bright outdoor lighting.  Clear any walking paths of anything that might make someone trip, such as rocks or tools.  Regularly check to see if handrails are loose or broken. Make sure that both sides of any steps have handrails.  Any raised decks and porches should have guardrails on the  edges.  Have any leaves, snow, or ice cleared regularly.  Use sand or salt on walking paths during winter.  Clean up any spills in your garage right away. This includes oil or grease spills. What can I do in the bathroom?  Use night lights.  Install grab bars by the toilet and in the tub and shower. Do not use towel bars as grab bars.  Use non-skid mats or decals in the tub or shower.  If you need to sit down in the shower, use a plastic, non-slip stool.  Keep the floor dry. Clean up any water that spills on the floor as soon as it happens.  Remove soap buildup in the tub or shower regularly.  Attach bath mats securely with double-sided non-slip rug tape.  Do not have throw rugs and other things on the floor that can make you trip. What can I do in the bedroom?  Use night lights.  Make sure that you have a light by your bed that is easy to reach.  Do not use any sheets or blankets that are too big for your bed. They should not hang down onto  the floor.  Have a firm chair that has side arms. You can use this for support while you get dressed.  Do not have throw rugs and other things on the floor that can make you trip. What can I do in the kitchen?  Clean up any spills right away.  Avoid walking on wet floors.  Keep items that you use a lot in easy-to-reach places.  If you need to reach something above you, use a strong step stool that has a grab bar.  Keep electrical cords out of the way.  Do not use floor polish or wax that makes floors slippery. If you must use wax, use non-skid floor wax.  Do not have throw rugs and other things on the floor that can make you trip. What can I do with my stairs?  Do not leave any items on the stairs.  Make sure that there are handrails on both sides of the stairs and use them. Fix handrails that are broken or loose. Make sure that handrails are as long as the stairways.  Check any carpeting to make sure that it is firmly  attached to the stairs. Fix any carpet that is loose or worn.  Avoid having throw rugs at the top or bottom of the stairs. If you do have throw rugs, attach them to the floor with carpet tape.  Make sure that you have a light switch at the top of the stairs and the bottom of the stairs. If you do not have them, ask someone to add them for you. What else can I do to help prevent falls?  Wear shoes that:  Do not have high heels.  Have rubber bottoms.  Are comfortable and fit you well.  Are closed at the toe. Do not wear sandals.  If you use a stepladder:  Make sure that it is fully opened. Do not climb a closed stepladder.  Make sure that both sides of the stepladder are locked into place.  Ask someone to hold it for you, if possible.  Clearly mark and make sure that you can see:  Any grab bars or handrails.  First and last steps.  Where the edge of each step is.  Use tools that help you move around (mobility aids) if they are needed. These include:  Canes.  Walkers.  Scooters.  Crutches.  Turn on the lights when you go into a dark area. Replace any light bulbs as soon as they burn out.  Set up your furniture so you have a clear path. Avoid moving your furniture around.  If any of your floors are uneven, fix them.  If there are any pets around you, be aware of where they are.  Review your medicines with your doctor. Some medicines can make you feel dizzy. This can increase your chance of falling. Ask your doctor what other things that you can do to help prevent falls. This information is not intended to replace advice given to you by your health care provider. Make sure you discuss any questions you have with your health care provider. Document Released: 08/17/2009 Document Revised: 03/28/2016 Document Reviewed: 11/25/2014 Elsevier Interactive Patient Education  2017 [employer].

## 2017-03-06 ENCOUNTER — Telehealth: Payer: Self-pay | Admitting: Internal Medicine

## 2017-03-06 DIAGNOSIS — M961 Postlaminectomy syndrome, not elsewhere classified: Secondary | ICD-10-CM | POA: Diagnosis not present

## 2017-03-06 DIAGNOSIS — M1711 Unilateral primary osteoarthritis, right knee: Secondary | ICD-10-CM | POA: Diagnosis not present

## 2017-03-06 DIAGNOSIS — R69 Illness, unspecified: Secondary | ICD-10-CM | POA: Diagnosis not present

## 2017-03-06 DIAGNOSIS — G894 Chronic pain syndrome: Secondary | ICD-10-CM | POA: Diagnosis not present

## 2017-03-06 MED ORDER — LUBIPROSTONE 24 MCG PO CAPS: 24.0000 ug | ORAL_CAPSULE | Freq: Two times a day (BID) | ORAL | 1 refills | Status: DC

## 2017-03-06 MED ORDER — PANTOPRAZOLE SODIUM 40 MG PO TBEC: 40.0000 mg | DELAYED_RELEASE_TABLET | Freq: Every day | ORAL | 0 refills | Status: DC

## 2017-03-06 NOTE — Telephone Encounter (Signed)
Rx sent 

## 2017-03-11 ENCOUNTER — Other Ambulatory Visit: Payer: Self-pay | Admitting: Family Medicine

## 2017-03-12 ENCOUNTER — Ambulatory Visit (INDEPENDENT_AMBULATORY_CARE_PROVIDER_SITE_OTHER): Payer: 59 | Admitting: Psychiatry

## 2017-03-12 ENCOUNTER — Encounter (HOSPITAL_COMMUNITY): Payer: Self-pay | Admitting: Psychiatry

## 2017-03-12 VITALS — BP 112/73 | HR 82 | Ht 67.0 in | Wt 169.2 lb

## 2017-03-12 DIAGNOSIS — Z813 Family history of other psychoactive substance abuse and dependence: Secondary | ICD-10-CM

## 2017-03-12 DIAGNOSIS — F4310 Post-traumatic stress disorder, unspecified: Secondary | ICD-10-CM | POA: Diagnosis not present

## 2017-03-12 DIAGNOSIS — F3175 Bipolar disorder, in partial remission, most recent episode depressed: Secondary | ICD-10-CM | POA: Diagnosis not present

## 2017-03-12 DIAGNOSIS — R69 Illness, unspecified: Secondary | ICD-10-CM | POA: Diagnosis not present

## 2017-03-12 DIAGNOSIS — Z811 Family history of alcohol abuse and dependence: Secondary | ICD-10-CM | POA: Diagnosis not present

## 2017-03-12 DIAGNOSIS — Z818 Family history of other mental and behavioral disorders: Secondary | ICD-10-CM

## 2017-03-12 MED ORDER — ALPRAZOLAM 1 MG PO TABS: 1.0000 mg | ORAL_TABLET | Freq: Three times a day (TID) | ORAL | 2 refills | Status: DC | PRN

## 2017-03-12 MED ORDER — TRAZODONE HCL 50 MG PO TABS: 50.0000 mg | ORAL_TABLET | Freq: Every day | ORAL | 2 refills | Status: DC

## 2017-03-12 MED ORDER — ESCITALOPRAM OXALATE 20 MG PO TABS: 20.0000 mg | ORAL_TABLET | Freq: Every day | ORAL | 2 refills | Status: DC
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
## 2017-03-14 ENCOUNTER — Ambulatory Visit (HOSPITAL_COMMUNITY): Payer: Self-pay | Admitting: Psychiatry

## 2017-03-19 ENCOUNTER — Other Ambulatory Visit: Payer: Self-pay | Admitting: Family Medicine

## 2017-03-27 ENCOUNTER — Ambulatory Visit: Payer: Self-pay

## 2017-04-07 ENCOUNTER — Ambulatory Visit (HOSPITAL_COMMUNITY): Payer: Self-pay | Admitting: Psychiatry

## 2017-04-11 ENCOUNTER — Other Ambulatory Visit: Payer: Self-pay

## 2017-04-11 MED ORDER — LORATADINE 10 MG PO TABS: 10.0000 mg | ORAL_TABLET | Freq: Every day | ORAL | 1 refills | Status: DC

## 2017-04-15 ENCOUNTER — Ambulatory Visit (INDEPENDENT_AMBULATORY_CARE_PROVIDER_SITE_OTHER): Payer: Medicare HMO | Admitting: Family Medicine

## 2017-04-15 ENCOUNTER — Encounter: Payer: Self-pay | Admitting: Family Medicine

## 2017-04-15 ENCOUNTER — Telehealth: Payer: Self-pay

## 2017-04-15 VITALS — BP 104/64 | HR 79 | Resp 16 | Ht 67.0 in | Wt 174.0 lb

## 2017-04-15 DIAGNOSIS — N3000 Acute cystitis without hematuria: Secondary | ICD-10-CM | POA: Diagnosis not present

## 2017-04-15 DIAGNOSIS — G894 Chronic pain syndrome: Secondary | ICD-10-CM

## 2017-04-15 DIAGNOSIS — M25571 Pain in right ankle and joints of right foot: Secondary | ICD-10-CM | POA: Diagnosis not present

## 2017-04-15 DIAGNOSIS — M25572 Pain in left ankle and joints of left foot: Secondary | ICD-10-CM | POA: Diagnosis not present

## 2017-04-15 DIAGNOSIS — G8929 Other chronic pain: Secondary | ICD-10-CM | POA: Diagnosis not present

## 2017-04-15 DIAGNOSIS — R7303 Prediabetes: Secondary | ICD-10-CM

## 2017-04-15 DIAGNOSIS — J45991 Cough variant asthma: Secondary | ICD-10-CM

## 2017-04-15 DIAGNOSIS — E663 Overweight: Secondary | ICD-10-CM | POA: Diagnosis not present

## 2017-04-15 DIAGNOSIS — I10 Essential (primary) hypertension: Secondary | ICD-10-CM

## 2017-04-15 LAB — POCT URINALYSIS DIPSTICK
BILIRUBIN UA: NEGATIVE
GLUCOSE UA: NEGATIVE
Ketones, UA: NEGATIVE
NITRITE UA: POSITIVE
Protein, UA: 30
RBC UA: NEGATIVE
Spec Grav, UA: 1.02 (ref 1.010–1.025)
Urobilinogen, UA: 2 E.U./dL — AB
pH, UA: 5.5 (ref 5.0–8.0)

## 2017-04-15 MED ORDER — FLUCONAZOLE 150 MG PO TABS: 150.0000 mg | ORAL_TABLET | Freq: Once | ORAL | 0 refills | Status: DC

## 2017-04-15 MED ORDER — LEVOFLOXACIN 500 MG PO TABS: 500.0000 mg | ORAL_TABLET | Freq: Every day | ORAL | 0 refills | Status: DC

## 2017-04-15 MED ORDER — FLUCONAZOLE 150 MG PO TABS: 150.0000 mg | ORAL_TABLET | Freq: Once | ORAL | 0 refills | Status: AC

## 2017-04-15 NOTE — Patient Instructions (Signed)
Annua;l physical exam Nov 18 call if you need me before  Medication sent for infection   You are referred to foot center for ankle pain     We will maill lab order  We will follow up on mammogram  Thank you  for choosing [facility]. We consider it a privelige to serve you.  Delivering excellent health care in a caring and  compassionate way is our goal.  Partnering with you,  so that together we can achieve this goal is our strategy.

## 2017-04-16 DIAGNOSIS — M25572 Pain in left ankle and joints of left foot: Secondary | ICD-10-CM

## 2017-04-16 DIAGNOSIS — M25571 Pain in right ankle and joints of right foot: Secondary | ICD-10-CM | POA: Insufficient documentation

## 2017-04-16 LAB — URINE CULTURE: ORGANISM ID, BACTERIA: NO GROWTH

## 2017-04-16 NOTE — Assessment & Plan Note (Signed)
Patient educated about the importance of limiting  Carbohydrate intake , the need to commit to daily physical activity for a minimum of 30 minutes , and to commit weight loss. The fact that changes in all these areas will reduce or eliminate all together the development of diabetes is stressed.   Diabetic Labs Latest Ref Rng & Units 11/11/2016 08/18/2016 04/16/2016 02/17/2016 07/31/2015  HbA1c <5.7 % 5.9(H) - 5.9(H) - 6.1(H)  Microalbumin 0.00 - 1.89 mg/dL - - - - -  Micro/Creat Ratio 0.0 - 30.0 mg/g - - - - -  Chol 125 - 200 mg/dL - - - - 188  HDL >=46 mg/dL - - - - 69  Calc LDL <130 mg/dL - - - - 105  Triglycerides <150 mg/dL - - - - 72  Creatinine 0.50 - 1.10 mg/dL 1.24(H) 1.02(H) 0.93 0.91 0.87  GFR >60.00 mL/min - - - - -   BP/Weight 04/15/2017 03/05/2017 12/25/2016 12/16/2016 11/18/2016 11/11/2016 41/01/130  Systolic BP 438 887 579 728 206 015 615  Diastolic BP 64 64 67 78 63 72 68  Wt. (Lbs) 174 170.04 167 168 164.6 168.04 -  BMI 27.25 26.63 26.95 27.12 26.57 27.12 -  Some encounter information is confidential and restricted. Go to Review Flowsheets activity to see all data.   No flowsheet data found.   Updated lab needed at/ before next visit.

## 2017-04-16 NOTE — Assessment & Plan Note (Signed)
Controlled and stable

## 2017-04-16 NOTE — Assessment & Plan Note (Signed)
C/o one month history of  approx of bilateral ankle pain , no trauma, unable to wear anything but flats, unable to weight bear for prolonged periods or walk, refer to podiatry for further eval and mangement, first episode, no other joint involved

## 2017-04-16 NOTE — Assessment & Plan Note (Signed)
Managed by pain clinic 

## 2017-04-16 NOTE — Progress Notes (Signed)
Jane English     MRN: [id number]      DOB: [birth year]   HPI Ms. Kuhlmann is here for follow up and re-evaluation of chronic medical conditions, medication management and review of any available recent lab and radiology data.  Preventive health is updated, specifically  Cancer screening and Immunization.   Questions or concerns regarding consultations or procedures which the PT has had in the interim are  addressed. The PT denies any adverse reactions to current medications since the last visit.  1 week h/o frequency, dysuria and malodorous urine 1 month h/o bilateral ankle pain , unable to wear her reglar shoes , pain with walkking Low back pain radiaitng to LLQ  ROS Denies recent fever or chills. Denies sinus pressure, nasal congestion, ear pain or sore throat. Denies chest congestion, productive cough or wheezing. Denies chest pains, palpitations and leg swelling Denies abdominal pain, nausea, vomiting,diarrhea or constipation.    Denies headaches, seizures, numbness, or tingling. Denies uncontrolled  depression, anxiety or insomnia. Denies skin break down or rash.   PE  BP 104/64   Pulse 79   Resp 16   Ht 5\' 7"  (1.702 m)   Wt 174 lb (78.9 kg)   SpO2 96%   BMI 27.25 kg/m   Patient alert and oriented and in no cardiopulmonary distress.  HEENT: No facial asymmetry, EOMI,   oropharynx pink and moist.  Neck supple no JVD, no mass.  Chest: Clear to auscultation bilaterally.  CVS: S1, S2 no murmurs, no S3.Regular rate.  ABD: Soft suprapubic tenderness.   Ext: No edema  MS: Adequate ROM spine, shoulders, hips and knees.Tender over lumbar spine, bilateral ankle tenderness Skin: Intact, no ulcerations or rash noted.  Psych: Good eye contact, normal affect. Memory intact not anxious or depressed appearing.  CNS: CN 2-12 intact, power,  normal throughout.no focal deficits noted.   Assessment & Plan  Essential hypertension Controlled, no change in medication DASH  diet and commitment to daily physical activity for a minimum of 30 minutes discussed and encouraged, as a part of hypertension management. The importance of attaining a healthy weight is also discussed.  BP/Weight 04/15/2017 03/05/2017 12/25/2016 12/16/2016 11/18/2016 11/11/2016 29/93/7169  Systolic BP 678 938 101 751 025 852 778  Diastolic BP 64 64 67 78 63 72 68  Wt. (Lbs) 174 170.04 167 168 164.6 168.04 -  BMI 27.25 26.63 26.95 27.12 26.57 27.12 -  Some encounter information is confidential and restricted. Go to Review Flowsheets activity to see all data.       Acute cystitis with hematuria Symptomatic with abnormal uA, 3 day course of levaquin prescribed with fluconazole x 1, follow up c/s  Cough variant asthma Controlled and stable  Prediabetes Patient educated about the importance of limiting  Carbohydrate intake , the need to commit to daily physical activity for a minimum of 30 minutes , and to commit weight loss. The fact that changes in all these areas will reduce or eliminate all together the development of diabetes is stressed.   Diabetic Labs Latest Ref Rng & Units 11/11/2016 08/18/2016 04/16/2016 02/17/2016 07/31/2015  HbA1c <5.7 % 5.9(H) - 5.9(H) - 6.1(H)  Microalbumin 0.00 - 1.89 mg/dL - - - - -  Micro/Creat Ratio 0.0 - 30.0 mg/g - - - - -  Chol 125 - 200 mg/dL - - - - 188  HDL >=46 mg/dL - - - - 69  Calc LDL <130 mg/dL - - - - 105  Triglycerides <150 mg/dL - - - -  72  Creatinine 0.50 - 1.10 mg/dL 1.24(H) 1.02(H) 0.93 0.91 0.87  GFR >60.00 mL/min - - - - -   BP/Weight 04/15/2017 03/05/2017 12/25/2016 12/16/2016 11/18/2016 11/11/2016 16/24/4695  Systolic BP 072 257 505 183 358 251 898  Diastolic BP 64 64 67 78 63 72 68  Wt. (Lbs) 174 170.04 167 168 164.6 168.04 -  BMI 27.25 26.63 26.95 27.12 26.57 27.12 -  Some encounter information is confidential and restricted. Go to Review Flowsheets activity to see all data.   No flowsheet data found.   Updated lab needed at/ before next  visit.   Overweight (BMI 25.0-29.9) Deteriorated. Patient re-educated about  the importance of commitment to a  minimum of 150 minutes of exercise per week.  The importance of healthy food choices with portion control discussed. Encouraged to start a food diary, count calories and to consider  joining a support group. Sample diet sheets offered. Goals set by the patient for the next several months.   Weight /BMI 04/15/2017 03/05/2017 12/25/2016  WEIGHT 174 lb 170 lb 0.6 oz 167 lb  HEIGHT 5\' 7"  5\' 7"  5\' 6"   BMI 27.25 kg/m2 26.63 kg/m2 26.95 kg/m2  Some encounter information is confidential and restricted. Go to Review Flowsheets activity to see all data.      Chronic pain syndrome Managed by pain clinic  Bipolar disorder Currently stable and managed by psychiatry  Bilateral ankle pain C/o one month history of  approx of bilateral ankle pain , no trauma, unable to wear anything but flats, unable to weight bear for prolonged periods or walk, refer to podiatry for further eval and mangement, first episode, no other joint involved

## 2017-04-16 NOTE — Assessment & Plan Note (Signed)
Controlled, no change in medication DASH diet and commitment to daily physical activity for a minimum of 30 minutes discussed and encouraged, as a part of hypertension management. The importance of attaining a healthy weight is also discussed.  BP/Weight 04/15/2017 03/05/2017 12/25/2016 12/16/2016 11/18/2016 11/11/2016 37/90/2409  Systolic BP 735 329 924 268 341 962 229  Diastolic BP 64 64 67 78 63 72 68  Wt. (Lbs) 174 170.04 167 168 164.6 168.04 -  BMI 27.25 26.63 26.95 27.12 26.57 27.12 -  Some encounter information is confidential and restricted. Go to Review Flowsheets activity to see all data.

## 2017-04-16 NOTE — Assessment & Plan Note (Signed)
Currently stable and managed by psychiatry

## 2017-04-16 NOTE — Assessment & Plan Note (Signed)
Deteriorated. Patient re-educated about  the importance of commitment to a  minimum of 150 minutes of exercise per week.  The importance of healthy food choices with portion control discussed. Encouraged to start a food diary, count calories and to consider  joining a support group. Sample diet sheets offered. Goals set by the patient for the next several months.   Weight /BMI 04/15/2017 03/05/2017 12/25/2016  WEIGHT 174 lb 170 lb 0.6 oz 167 lb  HEIGHT 5\' 7"  5\' 7"  5\' 6"   BMI 27.25 kg/m2 26.63 kg/m2 26.95 kg/m2  Some encounter information is confidential and restricted. Go to Review Flowsheets activity to see all data.

## 2017-04-16 NOTE — Assessment & Plan Note (Signed)
Symptomatic with abnormal uA, 3 day course of levaquin prescribed with fluconazole x 1, follow up c/s

## 2017-04-21 ENCOUNTER — Ambulatory Visit (INDEPENDENT_AMBULATORY_CARE_PROVIDER_SITE_OTHER): Payer: Medicare HMO | Admitting: Internal Medicine

## 2017-04-21 ENCOUNTER — Encounter: Payer: Self-pay | Admitting: Internal Medicine

## 2017-04-21 VITALS — BP 102/60 | HR 88 | Ht 65.5 in | Wt 177.1 lb

## 2017-04-21 DIAGNOSIS — K219 Gastro-esophageal reflux disease without esophagitis: Secondary | ICD-10-CM | POA: Diagnosis not present

## 2017-04-21 DIAGNOSIS — K660 Peritoneal adhesions (postprocedural) (postinfection): Secondary | ICD-10-CM | POA: Diagnosis not present

## 2017-04-21 DIAGNOSIS — K581 Irritable bowel syndrome with constipation: Secondary | ICD-10-CM

## 2017-04-21 DIAGNOSIS — K5909 Other constipation: Secondary | ICD-10-CM

## 2017-04-21 MED ORDER — LUBIPROSTONE 24 MCG PO CAPS: 24.0000 ug | ORAL_CAPSULE | Freq: Two times a day (BID) | ORAL | 11 refills | Status: DC

## 2017-04-21 MED ORDER — POLYETHYLENE GLYCOL 3350 17 GM/SCOOP PO POWD: ORAL | 11 refills | Status: DC

## 2017-04-21 MED ORDER — LINACLOTIDE 290 MCG PO CAPS: 290.0000 ug | ORAL_CAPSULE | Freq: Every day | ORAL | 11 refills | Status: DC

## 2017-04-21 NOTE — Progress Notes (Signed)
Subjective:    Patient ID: Jane English, female    DOB: [birth year], 46 y.o.   MRN: [id number]  HPI Jane English is a 46 year old female with history of chronic constipation, abdominal adhesive disease, IBS, chronic pain on chronic opiates followed by pain management, GERD, H. pylori status post treatment, bipolar depression, hypertension who is here for follow-up. She was last seen on 10-17 and presents alone today.  She reports that she still has episodes of constipation or stool is hard and difficult to pass. This is associated with left lower quadrant abdominal cramping and bloating. She is using Amitiza 24 g twice a day and Linzess 290 g daily. Also using MiraLAX 17 g twice a day. When constipation becomes more severe she will take a second Linzess along with her second dose of Amitiza. On these nights she goes to bed wearing an adult diaper because she often has spontaneous loose stools while sleeping. This does make her feel more completely evacuated and improves her abdominal cramping pain and bloating. She estimates wanting to do this as many as 2 days per week. She is using oxycodone for chronic back pain but not as frequently as prescribed. No blood in her stool or melena.  GERD is well controlled pantoprazole 40 mg daily though she is out of this medication recently and has had some breakthrough heartburn. No dysphagia or odynophagia.   Review of Systems As per history of present illness, otherwise negative  Current Medications, Allergies, Past Medical History, Past Surgical History, Family History and Social History were reviewed in [employer] record.     Objective:   Physical Exam BP 102/60 (BP Location: Left Arm, Patient Position: Sitting, Cuff Size: Normal)   Pulse 88   Ht 5' 5.5" (1.664 m) Comment: height measured without shoes  Wt 177 lb 2 oz (80.3 kg)   BMI 29.03 kg/m  Constitutional: Well-developed and well-nourished. No distress. HEENT:  Normocephalic and atraumatic. Oropharynx is clear and moist. Conjunctivae are normal.  No scleral icterus. Neck: Neck supple. Trachea midline. Cardiovascular: Normal rate, regular rhythm and intact distal pulses. No M/R/G Pulmonary/chest: Effort normal and breath sounds normal. No wheezing, rales or rhonchi. Abdominal: Soft, nontender, nondistended. Bowel sounds active throughout.  Extremities: no clubbing, cyanosis, or edema Neurological: Alert and oriented to person place and time. Skin: Skin is warm and dry. Psychiatric: Normal mood and affect. Behavior is normal.  CBC    Component Value Date/Time   WBC 6.2 11/11/2016 1441   RBC 4.18 11/11/2016 1441   HGB 12.4 11/11/2016 1441   HCT 37.8 11/11/2016 1441   PLT 300 11/11/2016 1441   MCV 90.4 11/11/2016 1441   MCH 29.7 11/11/2016 1441   MCHC 32.8 11/11/2016 1441   RDW 14.3 11/11/2016 1441   LYMPHSABS 1,178 11/11/2016 1441   MONOABS 744 11/11/2016 1441   EOSABS 248 11/11/2016 1441   BASOSABS 0 11/11/2016 1441       Assessment & Plan:  46 year old female with history of chronic constipation, abdominal adhesive disease, IBS, chronic pain on chronic opiates followed by pain management, GERD, H. pylori status post treatment, bipolar depression, hypertension who is here for follow-up.   1. Chronic constipation/IBS/abdominal adhesive disease -- she still has constipation despite multiple laxatives. I encouraged her to not use Linzess twice daily as this is not as the medication is recommended. We'll give her a bowel purge which has helped in the past. She was given a sample of Clenpiq and continues either half  or full dose and split fashion to purge bowel. After this resume Amitiza 24 g twice a day, Linzess 290 g daily and increase MiraLAX to 17 g 3 times a day. If this is not more effective she is asked to notify me and she voices understanding. Prior attempt to use mineral oil enema did not seem to benefit. Narcotics are worsening her  constipation but I chose to increase MiraLAX rather than add another agent such as Movantik  2. GERD - continue pantoprazole 40 mg daily. We discussed the risk, benefits and alternatives to PPI and she wishes to remain on this medication due to its benefits for her.  3. CRC screening patient had a normal colonoscopy in 2014, one polyp was removed but this was a benign lymphoid polyp and not an adenoma. Repeat screening recommended 06/24/2023  Follow-up in 1-2 years, sooner if necessary 25 minutes spent with the patient today. Greater than 50% was spent in counseling and coordination of care with the patient

## 2017-04-21 NOTE — Patient Instructions (Addendum)
Please use Clenpiq bowel purge as directed by Dr Hilarie Fredrickson at your visit today.  We have sent the following medications to your pharmacy for you to pick up at your convenience: Amitiza 24 mcg twice daily linzess 290 mcg daily Miralax 17 grams three times daily  Continue pantoprazole.  Follow up with Dr Hilarie Fredrickson in 1-2 years, sooner if needed.  If you are age 48 or older, your body mass index should be between 23-30. Your Body mass index is 29.03 kg/m. If this is out of the aforementioned range listed, please consider follow up with your Primary Care Provider.  If you are age 66 or younger, your body mass index should be between 19-25. Your Body mass index is 29.03 kg/m. If this is out of the aformentioned range listed, please consider follow up with your Primary Care Provider.

## 2017-04-23 NOTE — Telephone Encounter (Signed)
Orders signed.

## 2017-04-25 ENCOUNTER — Telehealth: Payer: Self-pay

## 2017-04-25 DIAGNOSIS — R7303 Prediabetes: Secondary | ICD-10-CM

## 2017-04-25 DIAGNOSIS — I10 Essential (primary) hypertension: Secondary | ICD-10-CM

## 2017-04-25 NOTE — Telephone Encounter (Signed)
-----  Message from Fayrene Helper, MD sent at 04/16/2017  6:39 AM EDT ----- Regarding: labs needed please draw week of june 25 pls ask pt get fasting lipid, cmp and egfr HBA1C and vit D, dx pediab, htn , thanks all are due

## 2017-04-27 ENCOUNTER — Other Ambulatory Visit: Payer: Self-pay | Admitting: Internal Medicine

## 2017-04-28 ENCOUNTER — Encounter (HOSPITAL_COMMUNITY): Payer: Self-pay | Admitting: Psychiatry

## 2017-04-28 ENCOUNTER — Ambulatory Visit (INDEPENDENT_AMBULATORY_CARE_PROVIDER_SITE_OTHER): Payer: 59 | Admitting: Psychiatry

## 2017-04-28 DIAGNOSIS — F319 Bipolar disorder, unspecified: Secondary | ICD-10-CM | POA: Diagnosis not present

## 2017-04-28 DIAGNOSIS — R69 Illness, unspecified: Secondary | ICD-10-CM | POA: Diagnosis not present

## 2017-04-28 DIAGNOSIS — F3175 Bipolar disorder, in partial remission, most recent episode depressed: Secondary | ICD-10-CM
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
## 2017-05-05 DIAGNOSIS — G479 Sleep disorder, unspecified: Secondary | ICD-10-CM | POA: Diagnosis not present

## 2017-05-05 DIAGNOSIS — J45909 Unspecified asthma, uncomplicated: Secondary | ICD-10-CM | POA: Diagnosis not present

## 2017-05-05 DIAGNOSIS — I10 Essential (primary) hypertension: Secondary | ICD-10-CM | POA: Diagnosis not present

## 2017-05-05 DIAGNOSIS — Z0000 Encounter for general adult medical examination without abnormal findings: Secondary | ICD-10-CM | POA: Diagnosis not present

## 2017-05-05 DIAGNOSIS — G43919 Migraine, unspecified, intractable, without status migrainosus: Secondary | ICD-10-CM | POA: Diagnosis not present

## 2017-05-05 DIAGNOSIS — J309 Allergic rhinitis, unspecified: Secondary | ICD-10-CM | POA: Diagnosis not present

## 2017-05-05 DIAGNOSIS — K5900 Constipation, unspecified: Secondary | ICD-10-CM | POA: Diagnosis not present

## 2017-05-05 DIAGNOSIS — R69 Illness, unspecified: Secondary | ICD-10-CM | POA: Diagnosis not present

## 2017-05-05 DIAGNOSIS — K219 Gastro-esophageal reflux disease without esophagitis: Secondary | ICD-10-CM | POA: Diagnosis not present

## 2017-05-06 ENCOUNTER — Ambulatory Visit (INDEPENDENT_AMBULATORY_CARE_PROVIDER_SITE_OTHER): Payer: 59 | Admitting: Podiatry

## 2017-05-06 ENCOUNTER — Ambulatory Visit (INDEPENDENT_AMBULATORY_CARE_PROVIDER_SITE_OTHER): Payer: 59

## 2017-05-06 ENCOUNTER — Ambulatory Visit: Payer: 59 | Admitting: Podiatry

## 2017-05-06 ENCOUNTER — Encounter: Payer: Self-pay | Admitting: Podiatry

## 2017-05-06 VITALS — BP 100/62 | HR 86 | Resp 16 | Ht 66.0 in | Wt 171.0 lb

## 2017-05-06 DIAGNOSIS — M25571 Pain in right ankle and joints of right foot: Secondary | ICD-10-CM

## 2017-05-06 DIAGNOSIS — M25572 Pain in left ankle and joints of left foot: Principal | ICD-10-CM

## 2017-05-06 DIAGNOSIS — M779 Enthesopathy, unspecified: Secondary | ICD-10-CM

## 2017-05-06 DIAGNOSIS — M722 Plantar fascial fibromatosis: Secondary | ICD-10-CM | POA: Diagnosis not present

## 2017-05-06 MED ORDER — METHYLPREDNISOLONE 4 MG PO TBPK: ORAL_TABLET | ORAL | 0 refills | Status: DC

## 2017-05-06 NOTE — Progress Notes (Signed)
   Subjective:    Patient ID: Jane English, female    DOB: 1971-04-21, 46 y.o.   MRN: [id number]  HPI: She presents today with a chief complaint of pain that radiates up into her legs and feet from her ankles for the past 6-7 months. She states that she has pain to the dorsal aspect of the foot and plantar aspect of the foot as well as numbness on the bottom of the feet. She states that the feet get cold and they have been doing so for about a year. She denies any trauma but does have a significant history.    Review of Systems  Constitutional: Positive for diaphoresis and unexpected weight change.  Eyes: Positive for itching.  Cardiovascular: Positive for leg swelling.  Gastrointestinal: Positive for abdominal distention and abdominal pain.  Musculoskeletal: Positive for arthralgias, back pain, gait problem and myalgias.  Neurological: Positive for headaches.  All other systems reviewed and are negative.      Objective:   Physical Exam: Vital signs are stable she is alert and oriented 3. Pulses are palpable. Neurologic system is intact. Degenerative flexors are intact. Muscle strength is normal and symmetrical bilateral. Orthopedic evaluation demonstrates rectus foot type she has stiffness on inversion of the subtalar joint right over left. She is pain on palpation of the subtalar joint and sinus tarsi area. Just pain on maximal inversion and eversion of the subtalar joint bilateral right greater than left. She also has pain on palpation of the plantar fascia bilaterally. Radiographs taken today demonstrate normal ankle joints bilateral. Does demonstrate some soft tissue increase in density of the plantar fascia calcaneal insertion sites bilaterally no major osseous abnormalities of the subtalar joint leading me to believe that subtalar joint capsulitis is more than likely compensatory to the plantar fasciitis. No open lesions or wounds are noted on physical exam.        Assessment &  Plan:  Assessment: Plantar fasciitis resulting in lateral compensatory syndrome and subtalar joint capsulitis.  Plan: Discussed etiology pathology conservative versus surgical therapies at this point I highly recommended injection of the plantar fascia started on a Medrol Dosepak she cannot take oral nonsteroidal anti-inflammatories. Placed her in a plantar fascia brace bilaterally and a night splint. We discussed appropriate shoe gear stretching exercises ice therapy and shoe gear modifications. If she is not significantly improved next visit then we will have to perform injections to the subtalar joints bilaterally.

## 2017-05-06 NOTE — Patient Instructions (Signed)

## 2017-05-14 ENCOUNTER — Encounter (HOSPITAL_COMMUNITY): Payer: Self-pay | Admitting: Psychiatry

## 2017-05-14 ENCOUNTER — Ambulatory Visit (INDEPENDENT_AMBULATORY_CARE_PROVIDER_SITE_OTHER): Payer: Medicare HMO | Admitting: Psychiatry

## 2017-05-14 DIAGNOSIS — F3175 Bipolar disorder, in partial remission, most recent episode depressed: Secondary | ICD-10-CM

## 2017-05-14 DIAGNOSIS — R69 Illness, unspecified: Secondary | ICD-10-CM | POA: Diagnosis not present
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
## 2017-05-17 ENCOUNTER — Other Ambulatory Visit: Payer: Self-pay | Admitting: Family Medicine

## 2017-05-28 ENCOUNTER — Encounter (HOSPITAL_COMMUNITY): Payer: Self-pay | Admitting: Psychiatry

## 2017-05-28 ENCOUNTER — Ambulatory Visit (INDEPENDENT_AMBULATORY_CARE_PROVIDER_SITE_OTHER): Payer: Medicare HMO | Admitting: Psychiatry

## 2017-05-28 DIAGNOSIS — F3175 Bipolar disorder, in partial remission, most recent episode depressed: Secondary | ICD-10-CM | POA: Diagnosis not present

## 2017-05-28 DIAGNOSIS — R69 Illness, unspecified: Secondary | ICD-10-CM | POA: Diagnosis not present
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
## 2017-05-29 ENCOUNTER — Ambulatory Visit (HOSPITAL_COMMUNITY): Payer: 59 | Admitting: Psychiatry

## 2017-06-02 ENCOUNTER — Telehealth (HOSPITAL_COMMUNITY): Payer: Self-pay | Admitting: *Deleted

## 2017-06-02 NOTE — Telephone Encounter (Signed)
returned phone call to patient.   She left voice message on 05/30/17.

## 2017-06-06 ENCOUNTER — Telehealth: Payer: Self-pay | Admitting: Family Medicine

## 2017-06-06 ENCOUNTER — Other Ambulatory Visit: Payer: Self-pay

## 2017-06-06 DIAGNOSIS — R300 Dysuria: Secondary | ICD-10-CM

## 2017-06-06 NOTE — Telephone Encounter (Signed)
Patient left message on nurse line. She would like Brandi to call her back at [phone]

## 2017-06-06 NOTE — Telephone Encounter (Signed)
Coming by after 1

## 2017-06-06 NOTE — Telephone Encounter (Signed)
Painful when urinating. Would like a Rx. She had taken something that actually helped before, however left a bad taste in her mouth.  She would like to discuss this with Brandi.     Rx [facility]

## 2017-06-06 NOTE — Telephone Encounter (Signed)
Called patient and left message for them to return call at the office   

## 2017-06-07 LAB — URINALYSIS W MICROSCOPIC + REFLEX CULTURE
BILIRUBIN URINE: NEGATIVE
Casts: NONE SEEN [LPF]
GLUCOSE, UA: NEGATIVE
Hgb urine dipstick: NEGATIVE
Ketones, ur: NEGATIVE
LEUKOCYTES UA: NEGATIVE
NITRITE: POSITIVE — AB
PROTEIN: NEGATIVE
SPECIFIC GRAVITY, URINE: 1.022 (ref 1.001–1.035)
YEAST: NONE SEEN [HPF]
pH: 6 (ref 5.0–8.0)

## 2017-06-09 ENCOUNTER — Other Ambulatory Visit: Payer: Self-pay

## 2017-06-09 ENCOUNTER — Other Ambulatory Visit: Payer: Self-pay | Admitting: Family Medicine

## 2017-06-09 ENCOUNTER — Telehealth: Payer: Self-pay

## 2017-06-09 DIAGNOSIS — R3912 Poor urinary stream: Secondary | ICD-10-CM

## 2017-06-09 DIAGNOSIS — N390 Urinary tract infection, site not specified: Secondary | ICD-10-CM

## 2017-06-09 DIAGNOSIS — R7303 Prediabetes: Secondary | ICD-10-CM | POA: Diagnosis not present

## 2017-06-09 DIAGNOSIS — I10 Essential (primary) hypertension: Secondary | ICD-10-CM | POA: Diagnosis not present

## 2017-06-09 LAB — COMPLETE METABOLIC PANEL WITH GFR
ALT: 13 U/L (ref 6–29)
AST: 15 U/L (ref 10–35)
Albumin: 3.9 g/dL (ref 3.6–5.1)
Alkaline Phosphatase: 55 U/L (ref 33–115)
BUN: 15 mg/dL (ref 7–25)
CHLORIDE: 106 mmol/L (ref 98–110)
CO2: 20 mmol/L (ref 20–32)
CREATININE: 0.99 mg/dL (ref 0.50–1.10)
Calcium: 8.8 mg/dL (ref 8.6–10.2)
GFR, Est African American: 78 mL/min (ref >=60)
GFR, Est Non African American: 68 mL/min (ref >=60)
Glucose, Bld: 86 mg/dL (ref 65–99)
Potassium: 3.7 mmol/L (ref 3.5–5.3)
Sodium: 136 mmol/L (ref 135–146)
Total Bilirubin: 0.3 mg/dL (ref 0.2–1.2)
Total Protein: 6.7 g/dL (ref 6.1–8.1)

## 2017-06-09 LAB — LIPID PANEL
Cholesterol: 152 mg/dL (ref <200)
HDL: 53 mg/dL (ref >50)
LDL Cholesterol: 73 mg/dL (ref <100)
TRIGLYCERIDES: 132 mg/dL (ref <150)
Total CHOL/HDL Ratio: 2.9 Ratio (ref <5.0)
VLDL: 26 mg/dL (ref <30)

## 2017-06-09 MED ORDER — CIPROFLOXACIN HCL 500 MG PO TABS: 500.0000 mg | ORAL_TABLET | Freq: Two times a day (BID) | ORAL | 0 refills | Status: DC

## 2017-06-09 NOTE — Telephone Encounter (Signed)
Pt had uti symptoms and went to lab to leave a urine and it was positive for nitrates and leukocytes. Standing order for cipro sent and pt wants to know if you will refer her to urology to see why she has so many uti's

## 2017-06-09 NOTE — Progress Notes (Unsigned)
Patient is referred to urology re poor stream and recurrent uTI

## 2017-06-10 ENCOUNTER — Encounter: Payer: Self-pay | Admitting: Podiatry

## 2017-06-10 ENCOUNTER — Ambulatory Visit (INDEPENDENT_AMBULATORY_CARE_PROVIDER_SITE_OTHER): Payer: Medicare HMO | Admitting: Podiatry

## 2017-06-10 DIAGNOSIS — M722 Plantar fascial fibromatosis: Secondary | ICD-10-CM | POA: Diagnosis not present

## 2017-06-10 DIAGNOSIS — M7750 Other enthesopathy of unspecified foot: Secondary | ICD-10-CM | POA: Diagnosis not present

## 2017-06-10 DIAGNOSIS — M779 Enthesopathy, unspecified: Secondary | ICD-10-CM

## 2017-06-10 DIAGNOSIS — M778 Other enthesopathies, not elsewhere classified: Secondary | ICD-10-CM

## 2017-06-10 LAB — HEMOGLOBIN A1C
Hgb A1c MFr Bld: 5.8 % — ABNORMAL HIGH (ref <5.7)
Mean Plasma Glucose: 120 mg/dL

## 2017-06-10 LAB — VITAMIN D 25 HYDROXY (VIT D DEFICIENCY, FRACTURES): Vit D, 25-Hydroxy: 22 ng/mL — ABNORMAL LOW (ref 30–100)

## 2017-06-11 ENCOUNTER — Ambulatory Visit (INDEPENDENT_AMBULATORY_CARE_PROVIDER_SITE_OTHER): Payer: 59 | Admitting: Psychiatry

## 2017-06-11 ENCOUNTER — Encounter (HOSPITAL_COMMUNITY): Payer: Self-pay | Admitting: Psychiatry

## 2017-06-11 DIAGNOSIS — F3175 Bipolar disorder, in partial remission, most recent episode depressed: Secondary | ICD-10-CM

## 2017-06-11 DIAGNOSIS — R69 Illness, unspecified: Secondary | ICD-10-CM | POA: Diagnosis not present
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
## 2017-06-11 NOTE — Progress Notes (Signed)
She presents today for follow-up of her bilateral heels states there is still burning and swelling she states that it feels better than it did before but I think most of this has to do with my back.  Objective: Vital signs are stable she is alert and oriented 3. Pulses are palpable. She is bilateral heel pain and pain on palpation to the sinus tarsi bilaterally.  Assessment: Pain plantar aspect of the bilateral foot with plantar fasciitis and sinus tarsitis with subtalar joint capsulitis. Pain is probably associated with back trauma and surgery.  Plan: Follow up with me in 1 month remember to thank her for the [employer] coupons. I injected her bilateral heels today as well as her sinus tarsi.

## 2017-06-12 ENCOUNTER — Ambulatory Visit (HOSPITAL_COMMUNITY): Payer: Self-pay | Admitting: Psychiatry

## 2017-06-12 ENCOUNTER — Encounter (HOSPITAL_COMMUNITY): Payer: Self-pay | Admitting: Psychiatry

## 2017-06-12 ENCOUNTER — Encounter: Payer: Self-pay | Admitting: Family Medicine

## 2017-06-12 ENCOUNTER — Ambulatory Visit (INDEPENDENT_AMBULATORY_CARE_PROVIDER_SITE_OTHER): Payer: 59 | Admitting: Psychiatry

## 2017-06-12 ENCOUNTER — Other Ambulatory Visit: Payer: Self-pay | Admitting: Family Medicine

## 2017-06-12 VITALS — BP 118/70 | Ht 66.0 in | Wt 175.0 lb

## 2017-06-12 DIAGNOSIS — Z813 Family history of other psychoactive substance abuse and dependence: Secondary | ICD-10-CM

## 2017-06-12 DIAGNOSIS — R69 Illness, unspecified: Secondary | ICD-10-CM | POA: Diagnosis not present

## 2017-06-12 DIAGNOSIS — Z79899 Other long term (current) drug therapy: Secondary | ICD-10-CM | POA: Diagnosis not present

## 2017-06-12 DIAGNOSIS — Z811 Family history of alcohol abuse and dependence: Secondary | ICD-10-CM | POA: Diagnosis not present

## 2017-06-12 DIAGNOSIS — Z818 Family history of other mental and behavioral disorders: Secondary | ICD-10-CM

## 2017-06-12 DIAGNOSIS — Z736 Limitation of activities due to disability: Secondary | ICD-10-CM | POA: Diagnosis not present

## 2017-06-12 DIAGNOSIS — F3175 Bipolar disorder, in partial remission, most recent episode depressed: Secondary | ICD-10-CM

## 2017-06-12 DIAGNOSIS — F4310 Post-traumatic stress disorder, unspecified: Secondary | ICD-10-CM | POA: Diagnosis not present

## 2017-06-12 MED ORDER — ALPRAZOLAM 1 MG PO TABS: 1.0000 mg | ORAL_TABLET | Freq: Three times a day (TID) | ORAL | 2 refills | Status: DC | PRN

## 2017-06-12 MED ORDER — TRAZODONE HCL 50 MG PO TABS: 50.0000 mg | ORAL_TABLET | Freq: Every day | ORAL | 2 refills | Status: DC

## 2017-06-12 MED ORDER — ERGOCALCIFEROL 1.25 MG (50000 UT) PO CAPS: 50000.0000 [IU] | ORAL_CAPSULE | ORAL | 1 refills | Status: DC

## 2017-06-12 MED ORDER — ESCITALOPRAM OXALATE 20 MG PO TABS: 20.0000 mg | ORAL_TABLET | Freq: Every day | ORAL | 2 refills | Status: DC
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
## 2017-06-12 NOTE — Progress Notes (Unsigned)
Vit d 

## 2017-06-17 DIAGNOSIS — M1711 Unilateral primary osteoarthritis, right knee: Secondary | ICD-10-CM | POA: Diagnosis not present

## 2017-06-17 DIAGNOSIS — G894 Chronic pain syndrome: Secondary | ICD-10-CM | POA: Diagnosis not present

## 2017-06-17 DIAGNOSIS — M961 Postlaminectomy syndrome, not elsewhere classified: Secondary | ICD-10-CM | POA: Diagnosis not present

## 2017-06-23 ENCOUNTER — Telehealth: Payer: Self-pay

## 2017-06-23 ENCOUNTER — Other Ambulatory Visit: Payer: Self-pay | Admitting: Family Medicine

## 2017-06-23 MED ORDER — CYCLOBENZAPRINE HCL 10 MG PO TABS: ORAL_TABLET | ORAL | 1 refills | Status: DC

## 2017-06-23 NOTE — Progress Notes (Unsigned)
Flexeril
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
## 2017-06-23 NOTE — Telephone Encounter (Signed)
Flexeril for bedtime use sent in as needed pls let her know

## 2017-06-23 NOTE — Telephone Encounter (Signed)
Pt left message c/o having "spasms", asking for a call back.

## 2017-06-24 NOTE — Telephone Encounter (Signed)
Patient aware and med sent  

## 2017-06-24 NOTE — Telephone Encounter (Signed)
error 

## 2017-07-02 ENCOUNTER — Encounter (HOSPITAL_COMMUNITY): Payer: Self-pay | Admitting: Psychiatry

## 2017-07-02 ENCOUNTER — Ambulatory Visit (INDEPENDENT_AMBULATORY_CARE_PROVIDER_SITE_OTHER): Payer: 59 | Admitting: Psychiatry

## 2017-07-02 DIAGNOSIS — R69 Illness, unspecified: Secondary | ICD-10-CM | POA: Diagnosis not present

## 2017-07-02 DIAGNOSIS — F3175 Bipolar disorder, in partial remission, most recent episode depressed: Secondary | ICD-10-CM
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
## 2017-07-08 ENCOUNTER — Other Ambulatory Visit: Payer: Self-pay | Admitting: Neurology

## 2017-07-08 ENCOUNTER — Telehealth: Payer: Self-pay | Admitting: Family Medicine

## 2017-07-08 ENCOUNTER — Other Ambulatory Visit: Payer: Self-pay | Admitting: Family Medicine

## 2017-07-08 ENCOUNTER — Other Ambulatory Visit: Payer: Self-pay | Admitting: *Deleted

## 2017-07-08 DIAGNOSIS — N926 Irregular menstruation, unspecified: Secondary | ICD-10-CM

## 2017-07-08 MED ORDER — TOPIRAMATE 50 MG PO TABS: 100.0000 mg | ORAL_TABLET | Freq: Two times a day (BID) | ORAL | 1 refills | Status: DC

## 2017-07-08 NOTE — Telephone Encounter (Signed)
Patient is requesting a call from you, she states it is very personal and she does not want to talk with anyone outside of you and brandi.  Cb#: [phone]

## 2017-07-08 NOTE — Telephone Encounter (Signed)
2 month h/o daily vaginal bleeding and malodorous urine, needs to see gyne gain and has uocoming appt with urolgy, will refer back to gyne Pt is aware

## 2017-07-08 NOTE — Progress Notes (Unsigned)
amb gyne  

## 2017-07-10 ENCOUNTER — Other Ambulatory Visit (HOSPITAL_COMMUNITY): Payer: Self-pay | Admitting: Psychiatry

## 2017-07-10 ENCOUNTER — Ambulatory Visit (INDEPENDENT_AMBULATORY_CARE_PROVIDER_SITE_OTHER): Payer: Medicare HMO | Admitting: Podiatry

## 2017-07-10 ENCOUNTER — Encounter: Payer: Self-pay | Admitting: Podiatry

## 2017-07-10 ENCOUNTER — Telehealth (HOSPITAL_COMMUNITY): Payer: Self-pay | Admitting: *Deleted

## 2017-07-10 DIAGNOSIS — M722 Plantar fascial fibromatosis: Secondary | ICD-10-CM | POA: Diagnosis not present

## 2017-07-10 MED ORDER — TRAZODONE HCL 50 MG PO TABS: 50.0000 mg | ORAL_TABLET | Freq: Every day | ORAL | 2 refills | Status: DC

## 2017-07-10 NOTE — Telephone Encounter (Signed)
sent 

## 2017-07-10 NOTE — Telephone Encounter (Signed)
noted 

## 2017-07-10 NOTE — Telephone Encounter (Signed)
Office received fax from [facility] in [place] requesting a 90 days supply for [facility] Trazodone 50 mg QHS. Per [facility] chart, medication was last filled on 06-12-2017 with 30 tabs 2 refills.

## 2017-07-10 NOTE — Progress Notes (Signed)
She presents today still having pain in the bilateral feet and legs. At this point with a history of back pain she's wondering if this may be associated. She states that the steroids to nonsteroidals and injections the braces the orthotics nothing seems to be helping her at all.  Objective: Vital signs are stable she is alert and oriented 3. Pulses are palpable. Neurologic sensorium is intact. The tendon reflexes are intact and muscle strength appears to be normal. She still has severe pain in her legs as well as her bilateral heels. It seems to be worse now than previously noted.  Assessment/plan:: Due to her back pathology and the pain in her feet and legs are highly recommend she be followed up by neurosurgery particularly Dr. Maryjean Ka to be considered for pain management or nerve stimulator. Remember to thank her for the [employer] donuts. If neurosurgery does not find anything that we will consider MRI for surgical intervention.

## 2017-07-11 ENCOUNTER — Telehealth: Payer: Self-pay | Admitting: *Deleted

## 2017-07-11 ENCOUNTER — Telehealth (HOSPITAL_COMMUNITY): Payer: Self-pay | Admitting: *Deleted

## 2017-07-11 NOTE — Telephone Encounter (Signed)
left voice message, provider out of office 07/28/17.

## 2017-07-11 NOTE — Telephone Encounter (Addendum)
-----   Message from Rip Harbour, [facility] sent at 07/10/2017  3:28 PM EDT ----- Regarding: Referral to Neuro [facility]  Evaluate chronic leg and back pain, history of DDD. 07/11/2017-Required form, clinicals and demographics to [facility].

## 2017-07-14 ENCOUNTER — Ambulatory Visit (HOSPITAL_COMMUNITY): Payer: Medicare HMO | Admitting: Psychiatry

## 2017-07-22 ENCOUNTER — Telehealth: Payer: Self-pay | Admitting: Internal Medicine

## 2017-07-22 NOTE — Telephone Encounter (Signed)
I am unable to give an insurance company information without the patient calling about this.

## 2017-07-24 ENCOUNTER — Ambulatory Visit (HOSPITAL_COMMUNITY): Payer: Self-pay | Admitting: Psychiatry

## 2017-07-24 ENCOUNTER — Ambulatory Visit: Payer: Medicare HMO | Admitting: Obstetrics and Gynecology

## 2017-07-27 ENCOUNTER — Other Ambulatory Visit: Payer: Self-pay | Admitting: Internal Medicine

## 2017-07-28 ENCOUNTER — Ambulatory Visit (HOSPITAL_COMMUNITY): Payer: Self-pay | Admitting: Psychiatry

## 2017-07-29 ENCOUNTER — Ambulatory Visit (INDEPENDENT_AMBULATORY_CARE_PROVIDER_SITE_OTHER): Payer: 59 | Admitting: Psychiatry

## 2017-07-29 ENCOUNTER — Encounter (HOSPITAL_COMMUNITY): Payer: Self-pay | Admitting: Psychiatry

## 2017-07-29 VITALS — BP 114/66 | HR 74 | Ht 66.0 in | Wt 180.0 lb

## 2017-07-29 DIAGNOSIS — R69 Illness, unspecified: Secondary | ICD-10-CM | POA: Diagnosis not present

## 2017-07-29 DIAGNOSIS — Z0001 Encounter for general adult medical examination with abnormal findings: Secondary | ICD-10-CM | POA: Diagnosis not present

## 2017-07-29 DIAGNOSIS — Z818 Family history of other mental and behavioral disorders: Secondary | ICD-10-CM

## 2017-07-29 DIAGNOSIS — Z811 Family history of alcohol abuse and dependence: Secondary | ICD-10-CM | POA: Diagnosis not present

## 2017-07-29 DIAGNOSIS — F4310 Post-traumatic stress disorder, unspecified: Secondary | ICD-10-CM

## 2017-07-29 DIAGNOSIS — Z79899 Other long term (current) drug therapy: Secondary | ICD-10-CM

## 2017-07-29 DIAGNOSIS — F319 Bipolar disorder, unspecified: Secondary | ICD-10-CM

## 2017-07-29 DIAGNOSIS — F3175 Bipolar disorder, in partial remission, most recent episode depressed: Secondary | ICD-10-CM

## 2017-07-29 DIAGNOSIS — Z813 Family history of other psychoactive substance abuse and dependence: Secondary | ICD-10-CM

## 2017-07-29 MED ORDER — ESCITALOPRAM OXALATE 20 MG PO TABS: 20.0000 mg | ORAL_TABLET | Freq: Every day | ORAL | 2 refills | Status: DC

## 2017-07-29 MED ORDER — TRAZODONE HCL 50 MG PO TABS: 50.0000 mg | ORAL_TABLET | Freq: Every day | ORAL | 2 refills | Status: DC

## 2017-07-29 MED ORDER — ALPRAZOLAM 1 MG PO TABS: 1.0000 mg | ORAL_TABLET | Freq: Three times a day (TID) | ORAL | 2 refills | Status: DC | PRN
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
## 2017-07-30 ENCOUNTER — Ambulatory Visit: Payer: Medicare HMO | Admitting: Obstetrics and Gynecology

## 2017-08-02 ENCOUNTER — Other Ambulatory Visit: Payer: Self-pay | Admitting: Family Medicine

## 2017-08-04 ENCOUNTER — Telehealth: Payer: Self-pay | Admitting: Internal Medicine

## 2017-08-04 NOTE — Telephone Encounter (Signed)
Again, I am unable to give information to an insurance company about a patient without patient's consent and without [place] initiating a request for the medication.

## 2017-08-06 ENCOUNTER — Ambulatory Visit: Payer: Medicare HMO | Admitting: Obstetrics and Gynecology

## 2017-08-07 DIAGNOSIS — R8290 Unspecified abnormal findings in urine: Secondary | ICD-10-CM | POA: Diagnosis not present

## 2017-08-11 ENCOUNTER — Encounter (HOSPITAL_COMMUNITY): Payer: Self-pay | Admitting: Psychiatry

## 2017-08-11 ENCOUNTER — Ambulatory Visit (INDEPENDENT_AMBULATORY_CARE_PROVIDER_SITE_OTHER): Payer: 59 | Admitting: Psychiatry

## 2017-08-11 DIAGNOSIS — F3175 Bipolar disorder, in partial remission, most recent episode depressed: Secondary | ICD-10-CM | POA: Diagnosis not present

## 2017-08-11 DIAGNOSIS — R69 Illness, unspecified: Secondary | ICD-10-CM | POA: Diagnosis not present
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
## 2017-08-12 ENCOUNTER — Telehealth: Payer: Self-pay

## 2017-08-12 NOTE — Telephone Encounter (Signed)
Jalayia called asking for you to call her back, would not leave details.  # [phone].

## 2017-08-13 ENCOUNTER — Encounter: Payer: Self-pay | Admitting: Obstetrics and Gynecology

## 2017-08-13 ENCOUNTER — Ambulatory Visit (INDEPENDENT_AMBULATORY_CARE_PROVIDER_SITE_OTHER): Payer: Managed Care, Other (non HMO) | Admitting: Obstetrics and Gynecology

## 2017-08-13 DIAGNOSIS — R8290 Unspecified abnormal findings in urine: Secondary | ICD-10-CM

## 2017-08-13 LAB — POCT URINALYSIS DIPSTICK
GLUCOSE UA: NEGATIVE
KETONES UA: NEGATIVE
Leukocytes, UA: NEGATIVE
NITRITE UA: POSITIVE
Protein, UA: NEGATIVE

## 2017-08-13 NOTE — Progress Notes (Signed)
[facility] Visit  08/13/2017            Patient name: Jane English MRN [id number]  Date of birth: [birth year]  CC & HPI:  Jane English is a 46 y.o. female presenting today for constant  abnormal vaginal bleeding with onset of 3 months. For the past 3 months, pt thinks she has had her period. She had not had her period up until 3 month ago. She describes it as dark, foul smelling discharge. Pt was concerned about this since her discharge wasn't stopping. Pt went to see PCP Dr. Moshe Cipro about this problem, where pt was not happy with their answer. Pt went to Dr. Gloriann Loan then for a second answer. Pt has hx of abnormal periods. She had a U/S done on 11/11/16, which was normal.  Pt also wants to discuss ablation as a possible solution. Pt was seen in office on 11/18/2016 for chronic RLQ abd. Pain s/p bowel resection.  ROS:  ROS  +AUB -fever All systems are negative except as noted in the HPI and PMH.    Pertinent History Reviewed:   Reviewed: bowel resection Medical         Past Medical History:  Diagnosis Date  . Anemia   . Asthma   . Asthma flare 04/09/2013  . Back pain   . Bronchitis   . Chronic abdominal pain   . Chronic constipation   . Constipation due to opioid therapy   . Depression   . Diabetes mellitus without complication ([place])   . Diabetes mellitus, type II ([place])   . DVT (deep venous thrombosis) ([place]) 2010  . GERD (gastroesophageal reflux disease)   . Heart murmur    no cardiologist  . Helicobacter pylori gastritis 06/11/2013   Colonoscopy Dr. Hilarie Fredrickson  . Hypertension   . Migraine headache   . Neuropathy   . Obesity   . Obsessive-compulsive disorder   . PSYCHOTIC D/O W/HALLUCINATIONS CONDS CLASS ELSW 03/04/2010   Qualifier: Diagnosis of  By: Moshe Cipro MD, Joycelyn Schmid    . PTSD (post-traumatic stress disorder)   . SBO (small bowel obstruction) ([place]) 08/09/2013  . Seasonal allergies 12/10/2012  . Seizures ([place])   . Shortness of breath                                Surgical Hx:    Past Surgical History:  Procedure Laterality Date  . ANTERIOR CERVICAL DECOMP/DISCECTOMY FUSION  07/07/2012   Procedure: ANTERIOR CERVICAL DECOMPRESSION/DISCECTOMY FUSION 2 LEVELS;  Surgeon: Floyce Stakes, MD;  Location: [facility];  Service: Neurosurgery;  Laterality: N/A;  Cervical four-five, five - six  Anterior cervical decompression/diskectomy/fusion/plate  . APPENDECTOMY  1986  . BOWEL RESECTION N/A 07/29/2013   Procedure: serosal repair;  Surgeon: Adin Hector, MD;  Location: [facility];  Service: General;  Laterality: N/A;  . CARPAL TUNNEL RELEASE Bilateral   . LAPAROSCOPY N/A 07/29/2013   Procedure: diagnostic laporoscopy;  Surgeon: Adin Hector, MD;  Location: [facility];  Service: General;  Laterality: N/A;  . LAPAROSCOPY N/A 08/16/2013   Procedure: LAPAROSCOPY DIAGNOSTIC/LYSIS OF ADHESIONS;  Surgeon: Adin Hector, MD;  Location: [facility];  Service: General;  Laterality: N/A;  . LAPAROTOMY N/A 08/16/2013   Procedure: EXPLORATORY LAPAROTOMY/SMALL BOWEL RESECTION (JEJUNUM);  Surgeon: Adin Hector, MD;  Location: [facility];  Service: General;  Laterality: N/A;  . [place] SURGERY  2010  x 3  . LYSIS OF ADHESION  2003   Dr. Irving Shows  . LYSIS OF ADHESION N/A 07/29/2013   Procedure: LYSIS OF ADHESION;  Surgeon: Adin Hector, MD;  Location: [facility];  Service: General;  Laterality: N/A;  . OOPHORECTOMY    . PARTIAL HYSTERECTOMY  1990s?   [place], [place]  . SPINAL CORD STIMULATOR IMPLANT    . TRIGGER FINGER RELEASE  2009   right pinkie finger  . TUBAL LIGATION  1994   Medications: Reviewed & Updated - see associated section                       Current Outpatient Prescriptions:  .  albuterol (PROVENTIL) (2.5 MG/3ML) 0.083% nebulizer solution, Take 3 mLs (2.5 mg total) by nebulization every 6 (six) hours as needed for wheezing or shortness of breath., Disp: 75 mL, Rfl: 0 .  ALPRAZolam (XANAX) 1 MG tablet, Take 1 tablet (1 mg total) by mouth 3 (three) times  daily as needed for anxiety., Disp: 90 tablet, Rfl: 2 .  amLODipine (NORVASC) 10 MG tablet, Take 10 mg by mouth daily., Disp: , Rfl:  .  azelastine (ASTELIN) 0.1 % nasal spray, USE 2 SPRAYS IN EACH NOSTRIL TWICE A DAY, Disp: 30 mL, Rfl: 5 .  Beclomethasone Diprop HFA (QVAR REDIHALER) 40 MCG/ACT AERB, Inhale 2 puffs into the lungs 2 (two) times daily., Disp: 10.6 g, Rfl: 5 .  Botulinum Toxin Type A 200 units SOLR, Inject 200 Units as directed every 3 (three) months., Disp: 1 each, Rfl: 3 .  cetirizine (ZYRTEC) 10 MG tablet, Take 10 mg by mouth daily., Disp: , Rfl:  .  cholecalciferol (VITAMIN D) 1000 units tablet, Take 1,000 Units by mouth daily., Disp: , Rfl:  .  cloNIDine (CATAPRES) 0.1 MG tablet, TAKE 1 TABLET AT BEDTIME, Disp: 90 tablet, Rfl: 1 .  clotrimazole-betamethasone (LOTRISONE) cream, Apply 1 application topically 2 (two) times daily as needed (for rash)., Disp: 30 g, Rfl: 0 .  cyclobenzaprine (FLEXERIL) 10 MG tablet, One tablet at bedtime as needed, for spasm, Disp: 30 tablet, Rfl: 1 .  diclofenac sodium (VOLTAREN) 1 % GEL, Apply 4 g topically 4 (four) times daily as needed (for pain). , Disp: , Rfl: 5 .  ergocalciferol (VITAMIN D2) 50000 units capsule, Take 1 capsule (50,000 Units total) by mouth once a week. One capsule once weekly, Disp: 12 capsule, Rfl: 1 .  escitalopram (LEXAPRO) 20 MG tablet, Take 1 tablet (20 mg total) by mouth daily., Disp: 90 tablet, Rfl: 2 .  fluticasone (FLONASE) 50 MCG/ACT nasal spray, Place 2 sprays into both nostrils daily as needed for rhinitis., Disp: , Rfl:  .  ipratropium (ATROVENT) 0.02 % nebulizer solution, Take 0.5 mg by nebulization every 6 (six) hours as needed for wheezing or shortness of breath., Disp: , Rfl:  .  linaclotide (LINZESS) 290 MCG CAPS capsule, Take 1 capsule (290 mcg total) by mouth daily before breakfast., Disp: 30 capsule, Rfl: 11 .  loratadine (CLARITIN) 10 MG tablet, Take 1 tablet (10 mg total) by mouth daily., Disp: 90 tablet,  Rfl: 1 .  lubiprostone (AMITIZA) 24 MCG capsule, Take 1 capsule (24 mcg total) by mouth 2 (two) times daily with a meal., Disp: 60 capsule, Rfl: 11 .  montelukast (SINGULAIR) 10 MG tablet, TAKE 1 TABLET BY MOUTH AT BEDTIME, Disp: 90 tablet, Rfl: 1 .  Naproxen Sodium (ALEVE PO), Take by mouth as needed., Disp: , Rfl:  .  olopatadine (  PATANOL) 0.1 % ophthalmic solution, Place 1 drop into both eyes 2 (two) times daily., Disp: , Rfl:  .  ondansetron (ZOFRAN-ODT) 4 MG disintegrating tablet, Take 1 tablet by mouth every 12 (twelve) hours as needed for nausea/vomiting., Disp: , Rfl: 5 .  Oxycodone HCl 10 MG TABS, Take 10 mg by mouth every 12 (twelve) hours as needed (for pain). , Disp: , Rfl: 0 .  pantoprazole (PROTONIX) 40 MG tablet, TAKE 1 TABLET (40 MG TOTAL) BY MOUTH DAILY., Disp: 90 tablet, Rfl: 1 .  polyethylene glycol powder (GLYCOLAX/MIRALAX) powder, Take 17 grams (1 capful) dissolved in at least 8 ounces water juice and drink 3 times daily, Disp: 1530 g, Rfl: 11 .  potassium chloride 20 MEQ/15ML (10%) SOLN, Take 20 mEq by mouth 2 (two) times daily., Disp: , Rfl:  .  rizatriptan (MAXALT-MLT) 10 MG disintegrating tablet, Take 10 mg by mouth as needed for migraine. May repeat in 2 hours if needed, Disp: , Rfl:  .  spironolactone (ALDACTONE) 25 MG tablet, TAKE 1 TABLET BY MOUTH EVERY DAY, Disp: 90 tablet, Rfl: 1 .  SUMAtriptan (IMITREX) 6 MG/0.5ML SOLN injection, Inject 0.5 mLs (6 mg total) into the skin every 2 (two) hours as needed for migraine or headache. May repeat in 2 hours if headache persists or recurs., Disp: 12 vial, Rfl: 11 .  terbinafine (LAMISIL) 250 MG tablet, Take 1 tablet (250 mg total) by mouth daily., Disp: 42 tablet, Rfl: 1 .  topiramate (TOPAMAX) 50 MG tablet, Take 2 tablets (100 mg total) by mouth 2 (two) times daily., Disp: 360 tablet, Rfl: 1 .  traZODone (DESYREL) 50 MG tablet, Take 1 tablet (50 mg total) by mouth at bedtime., Disp: 90 tablet, Rfl: 2 .   triamterene-hydrochlorothiazide (MAXZIDE-25) 37.5-25 MG tablet, TAKE 1 TABLET BY MOUTH EVERY DAY (CHANGED TO TABLETS), Disp: 30 tablet, Rfl: 0   Social History: Reviewed -  reports that she has never smoked. She has never used smokeless tobacco.  Objective Findings:  Vitals: Blood pressure 130/80, pulse 93, height 5\' 6"  (1.676 m), weight 178 lb 9.6 oz (81 kg).  Physical Examination: General appearance - alert, well appearing, and in no distress Mental status - alert, oriented to person, place, and time Pelvic - Not indicated  Discussion: 1. Discussed with pt the option of ablation and how it applies more so to pts with regular periods. Pt's current issue also discussed, and how to proceed with her abnormal discharge.  At end of discussion, pt had opportunity to ask questions and has no further questions at this time.   Specific discussion as noted above. Greater than 50% was spent in counseling and coordination of care with the patient.   Total time greater than: 25 minutes.     Assessment & Plan:   A:  1. AUB 2. Possible bladder infection 3. BV P:  1. Keep record of periods 2. Prescribe Metronidazole 3. F/u in 3-4 weeks for gyn f/u    By signing my name below, I, Izna Ahmed, attest that this documentation has been prepared under the direction and in the presence of Jonnie Kind, MD. Electronically Signed: Jabier Gauss, Medical Scribe. 08/13/17. 4:54 PM.  I personally performed the services described in this documentation, which was SCRIBED in my presence. The recorded information has been reviewed and considered accurate. It has been edited as necessary during review. Jonnie Kind, MD

## 2017-08-14 ENCOUNTER — Ambulatory Visit (INDEPENDENT_AMBULATORY_CARE_PROVIDER_SITE_OTHER): Payer: Medicare HMO

## 2017-08-14 ENCOUNTER — Telehealth: Payer: Self-pay | Admitting: Obstetrics and Gynecology

## 2017-08-14 DIAGNOSIS — Z23 Encounter for immunization: Secondary | ICD-10-CM

## 2017-08-14 NOTE — Telephone Encounter (Signed)
Left message x 1. JSY 

## 2017-08-14 NOTE — Telephone Encounter (Signed)
Patient called stating that she had an appointment with Dr. Glo Herring yesterday and he told her he was suppose to place her on medication. Pt check pharmacy and there is no medication called in. Please contact pt

## 2017-08-15 ENCOUNTER — Other Ambulatory Visit: Payer: Self-pay | Admitting: Family Medicine

## 2017-08-15 LAB — URINE CULTURE

## 2017-08-15 MED ORDER — PHENTERMINE HCL 37.5 MG PO TABS: 37.5000 mg | ORAL_TABLET | Freq: Every day | ORAL | 0 refills | Status: DC

## 2017-08-15 MED ORDER — METRONIDAZOLE 500 MG PO TABS: 500.0000 mg | ORAL_TABLET | Freq: Two times a day (BID) | ORAL | 0 refills | Status: DC

## 2017-08-15 NOTE — Telephone Encounter (Signed)
Spoke directly with the patient, she is stressed and overeating, requests that she be placed on phentermine, short term. Two month supply at half tablet daily, written and will be faxed to [facility], behavioral change involving water only, no sugar, increased fruit and vegetable and exercise commitment also discussed. Goal is 6 pound weight loss, starting weight is 178 pounds

## 2017-08-15 NOTE — Telephone Encounter (Signed)
Left message x 2. JSY 

## 2017-08-15 NOTE — Progress Notes (Signed)
Phentermine

## 2017-08-18 NOTE — Telephone Encounter (Signed)
Seen [phone] 12 18 

## 2017-08-18 NOTE — Telephone Encounter (Signed)
Left message x 3. JSY 

## 2017-08-19 ENCOUNTER — Ambulatory Visit: Payer: Self-pay | Admitting: Family Medicine

## 2017-08-19 ENCOUNTER — Telehealth: Payer: Self-pay | Admitting: Obstetrics and Gynecology

## 2017-08-19 MED ORDER — SULFAMETHOXAZOLE-TRIMETHOPRIM 400-80 MG PO TABS: 1.0000 | ORAL_TABLET | Freq: Two times a day (BID) | ORAL | 0 refills | Status: DC

## 2017-08-19 NOTE — Telephone Encounter (Signed)
UTI treatment is a 4 Escherichia coli with Septra DS 7 days

## 2017-08-19 NOTE — Telephone Encounter (Signed)
Left 3 messages for pt to return call. Never heard from pt. Encounter closed. [place]

## 2017-08-20 ENCOUNTER — Telehealth: Payer: Self-pay | Admitting: *Deleted

## 2017-08-25 ENCOUNTER — Ambulatory Visit (HOSPITAL_COMMUNITY): Payer: Self-pay | Admitting: Psychiatry

## 2017-08-26 ENCOUNTER — Ambulatory Visit (HOSPITAL_COMMUNITY): Payer: Self-pay | Admitting: Psychiatry

## 2017-09-01 ENCOUNTER — Ambulatory Visit: Payer: Self-pay | Admitting: Family Medicine

## 2017-09-03 ENCOUNTER — Ambulatory Visit (INDEPENDENT_AMBULATORY_CARE_PROVIDER_SITE_OTHER): Payer: Managed Care, Other (non HMO) | Admitting: Obstetrics and Gynecology

## 2017-09-03 ENCOUNTER — Encounter: Payer: Self-pay | Admitting: Obstetrics and Gynecology

## 2017-09-03 VITALS — BP 126/80 | HR 87 | Ht 66.0 in | Wt 170.0 lb

## 2017-09-03 DIAGNOSIS — N898 Other specified noninflammatory disorders of vagina: Secondary | ICD-10-CM | POA: Diagnosis not present

## 2017-09-03 DIAGNOSIS — N939 Abnormal uterine and vaginal bleeding, unspecified: Secondary | ICD-10-CM | POA: Diagnosis not present

## 2017-09-03 DIAGNOSIS — R300 Dysuria: Secondary | ICD-10-CM

## 2017-09-03 LAB — POCT WET PREP (WET MOUNT)
KOH Wet Prep POC: NEGATIVE
Trichomonas Wet Prep HPF POC: ABSENT

## 2017-09-03 LAB — POCT URINALYSIS DIPSTICK
Blood, UA: NEGATIVE
Glucose, UA: NEGATIVE
Ketones, UA: NEGATIVE
LEUKOCYTES UA: NEGATIVE
NITRITE UA: NEGATIVE
PROTEIN UA: NEGATIVE

## 2017-09-03 NOTE — Progress Notes (Signed)
[facility] Visit  09/03/2017            Patient name: Jane English MRN [id number]  Date of birth: 1971-04-10  CC & HPI:  Jane English is a 46 y.o. female presenting today for gyn f/u of AUB, possible bladder infection, and BV. Pt was seen in office on 08/13/17, and prescribed metronidazole, advised to keep record of periods, and f/u in 3-4 weeks. She is still having issues with a foul smelling constant discharge. She denies any discomfort associated with the discharge. No alleviating factors noted. Pt has not tried any medications. She describes her discharge as brown-looking, however when she wipes herself, it looks like blood. She wants to discuss having an ablation. Pt has had right ovary removed. She had a transvaginal U/S done on 11/20/16. Last pap was Nov 2017, with abnormal findings of LSIL.  Pt accompanied by her partner.   ROS:  ROS -discomfort +foul smelling discharge +intestinal spasms All systems are negative except as noted in the HPI and PMH.    Pertinent History Reviewed:   Reviewed: [facility]         Past Medical History:  Diagnosis Date  . Anemia   . Asthma   . Asthma flare 04/09/2013  . Back pain   . Bronchitis   . Chronic abdominal pain   . Chronic constipation   . Constipation due to opioid therapy   . Depression   . Diabetes mellitus without complication ([place])   . Diabetes mellitus, type II ([place])   . DVT (deep venous thrombosis) ([place]) 2010  . GERD (gastroesophageal reflux disease)   . Heart murmur    no cardiologist  . Helicobacter pylori gastritis 06/11/2013   Colonoscopy Dr. Hilarie Fredrickson  . Hypertension   . Migraine headache   . Neuropathy   . Obesity   . Obsessive-compulsive disorder   . PSYCHOTIC D/O W/HALLUCINATIONS CONDS CLASS ELSW 03/04/2010   Qualifier: Diagnosis of  By: Moshe Cipro MD, Joycelyn Schmid    . PTSD (post-traumatic stress disorder)   . SBO (small bowel obstruction) ([place]) 08/09/2013  . Seasonal allergies 12/10/2012  . Seizures ([place])    . Shortness of breath                               Surgical Hx:    Past Surgical History:  Procedure Laterality Date  . ANTERIOR CERVICAL DECOMP/DISCECTOMY FUSION  07/07/2012   Procedure: ANTERIOR CERVICAL DECOMPRESSION/DISCECTOMY FUSION 2 LEVELS;  Surgeon: Floyce Stakes, MD;  Location: [facility];  Service: Neurosurgery;  Laterality: N/A;  Cervical four-five, five - six  Anterior cervical decompression/diskectomy/fusion/plate  . APPENDECTOMY  1986  . BOWEL RESECTION N/A 07/29/2013   Procedure: serosal repair;  Surgeon: Adin Hector, MD;  Location: [facility];  Service: General;  Laterality: N/A;  . CARPAL TUNNEL RELEASE Bilateral   . LAPAROSCOPY N/A 07/29/2013   Procedure: diagnostic laporoscopy;  Surgeon: Adin Hector, MD;  Location: [facility];  Service: General;  Laterality: N/A;  . LAPAROSCOPY N/A 08/16/2013   Procedure: LAPAROSCOPY DIAGNOSTIC/LYSIS OF ADHESIONS;  Surgeon: Adin Hector, MD;  Location: [facility];  Service: General;  Laterality: N/A;  . LAPAROTOMY N/A 08/16/2013   Procedure: EXPLORATORY LAPAROTOMY/SMALL BOWEL RESECTION (JEJUNUM);  Surgeon: Adin Hector, MD;  Location: [facility];  Service: General;  Laterality: N/A;  . LUMBAR SPINE SURGERY  2010   x 3  . LYSIS OF ADHESION  2003   Dr. Irving Shows  . LYSIS OF ADHESION N/A 07/29/2013   Procedure: LYSIS OF ADHESION;  Surgeon: Adin Hector, MD;  Location: [facility];  Service: General;  Laterality: N/A;  . OOPHORECTOMY    . PARTIAL HYSTERECTOMY  1990s?   [place], [place]  . SPINAL CORD STIMULATOR IMPLANT    . TRIGGER FINGER RELEASE  2009   right pinkie finger  . TUBAL LIGATION  1994   Medications: Reviewed & Updated - see associated section                       Current Outpatient Prescriptions:  .  albuterol (PROVENTIL) (2.5 MG/3ML) 0.083% nebulizer solution, Take 3 mLs (2.5 mg total) by nebulization every 6 (six) hours as needed for wheezing or shortness of breath., Disp: 75 mL, Rfl: 0 .  ALPRAZolam (XANAX) 1 MG  tablet, Take 1 tablet (1 mg total) by mouth 3 (three) times daily as needed for anxiety., Disp: 90 tablet, Rfl: 2 .  amLODipine (NORVASC) 10 MG tablet, Take 10 mg by mouth daily., Disp: , Rfl:  .  azelastine (ASTELIN) 0.1 % nasal spray, USE 2 SPRAYS IN EACH NOSTRIL TWICE A DAY, Disp: 30 mL, Rfl: 5 .  Beclomethasone Diprop HFA (QVAR REDIHALER) 40 MCG/ACT AERB, Inhale 2 puffs into the lungs 2 (two) times daily., Disp: 10.6 g, Rfl: 5 .  cetirizine (ZYRTEC) 10 MG tablet, Take 10 mg by mouth daily., Disp: , Rfl:  .  cholecalciferol (VITAMIN D) 1000 units tablet, Take 1,000 Units by mouth daily., Disp: , Rfl:  .  cloNIDine (CATAPRES) 0.1 MG tablet, TAKE 1 TABLET AT BEDTIME, Disp: 90 tablet, Rfl: 1 .  clotrimazole-betamethasone (LOTRISONE) cream, Apply 1 application topically 2 (two) times daily as needed (for rash)., Disp: 30 g, Rfl: 0 .  cyclobenzaprine (FLEXERIL) 10 MG tablet, One tablet at bedtime as needed, for spasm, Disp: 30 tablet, Rfl: 1 .  diclofenac sodium (VOLTAREN) 1 % GEL, Apply 4 g topically 4 (four) times daily as needed (for pain). , Disp: , Rfl: 5 .  ergocalciferol (VITAMIN D2) 50000 units capsule, Take 1 capsule (50,000 Units total) by mouth once a week. One capsule once weekly, Disp: 12 capsule, Rfl: 1 .  escitalopram (LEXAPRO) 20 MG tablet, Take 1 tablet (20 mg total) by mouth daily., Disp: 90 tablet, Rfl: 2 .  fluticasone (FLONASE) 50 MCG/ACT nasal spray, Place 2 sprays into both nostrils daily as needed for rhinitis., Disp: , Rfl:  .  ipratropium (ATROVENT) 0.02 % nebulizer solution, Take 0.5 mg by nebulization every 6 (six) hours as needed for wheezing or shortness of breath., Disp: , Rfl:  .  linaclotide (LINZESS) 290 MCG CAPS capsule, Take 1 capsule (290 mcg total) by mouth daily before breakfast., Disp: 30 capsule, Rfl: 11 .  loratadine (CLARITIN) 10 MG tablet, Take 1 tablet (10 mg total) by mouth daily., Disp: 90 tablet, Rfl: 1 .  lubiprostone (AMITIZA) 24 MCG capsule, Take 1  capsule (24 mcg total) by mouth 2 (two) times daily with a meal., Disp: 60 capsule, Rfl: 11 .  montelukast (SINGULAIR) 10 MG tablet, TAKE 1 TABLET BY MOUTH AT BEDTIME, Disp: 90 tablet, Rfl: 1 .  Naproxen Sodium (ALEVE PO), Take by mouth as needed., Disp: , Rfl:  .  olopatadine (PATANOL) 0.1 % ophthalmic solution, Place 1 drop into both eyes 2 (two) times daily., Disp: , Rfl:  .  ondansetron (ZOFRAN-ODT) 4 MG disintegrating tablet, Take 1 tablet by  mouth every 12 (twelve) hours as needed for nausea/vomiting., Disp: , Rfl: 5 .  Oxycodone HCl 10 MG TABS, Take 10 mg by mouth every 12 (twelve) hours as needed (for pain). , Disp: , Rfl: 0 .  pantoprazole (PROTONIX) 40 MG tablet, TAKE 1 TABLET (40 MG TOTAL) BY MOUTH DAILY., Disp: 90 tablet, Rfl: 1 .  phentermine (ADIPEX-P) 37.5 MG tablet, Take 1 tablet (37.5 mg total) by mouth daily before breakfast., Disp: 30 tablet, Rfl: 0 .  polyethylene glycol powder (GLYCOLAX/MIRALAX) powder, Take 17 grams (1 capful) dissolved in at least 8 ounces water juice and drink 3 times daily, Disp: 1530 g, Rfl: 11 .  potassium chloride 20 MEQ/15ML (10%) SOLN, Take 20 mEq by mouth 2 (two) times daily., Disp: , Rfl:  .  rizatriptan (MAXALT-MLT) 10 MG disintegrating tablet, Take 10 mg by mouth as needed for migraine. May repeat in 2 hours if needed, Disp: , Rfl:  .  spironolactone (ALDACTONE) 25 MG tablet, TAKE 1 TABLET BY MOUTH EVERY DAY, Disp: 90 tablet, Rfl: 1 .  SUMAtriptan (IMITREX) 6 MG/0.5ML SOLN injection, Inject 0.5 mLs (6 mg total) into the skin every 2 (two) hours as needed for migraine or headache. May repeat in 2 hours if headache persists or recurs., Disp: 12 vial, Rfl: 11 .  terbinafine (LAMISIL) 250 MG tablet, Take 1 tablet (250 mg total) by mouth daily., Disp: 42 tablet, Rfl: 1 .  topiramate (TOPAMAX) 50 MG tablet, Take 2 tablets (100 mg total) by mouth 2 (two) times daily., Disp: 360 tablet, Rfl: 1 .  traZODone (DESYREL) 50 MG tablet, Take 1 tablet (50 mg total)  by mouth at bedtime., Disp: 90 tablet, Rfl: 2 .  triamterene-hydrochlorothiazide (MAXZIDE-25) 37.5-25 MG tablet, TAKE 1 TABLET BY MOUTH EVERY DAY (CHANGED TO TABLETS), Disp: 30 tablet, Rfl: 0 .  Botulinum Toxin Type A 200 units SOLR, Inject 200 Units as directed every 3 (three) months. (Patient not taking: Reported on 09/03/2017), Disp: 1 each, Rfl: 3 .  metroNIDAZOLE (FLAGYL) 500 MG tablet, Take 1 tablet (500 mg total) by mouth 2 (two) times daily. (Patient not taking: Reported on 09/03/2017), Disp: 14 tablet, Rfl: 0 .  sulfamethoxazole-trimethoprim (BACTRIM) 400-80 MG tablet, Take 1 tablet by mouth 2 (two) times daily. Twice daily for uti (Patient not taking: Reported on 09/03/2017), Disp: 14 tablet, Rfl: 0   Social History: Reviewed -  reports that she has never smoked. She has never used smokeless tobacco.  Objective Findings:  Vitals: Blood pressure 126/80, pulse 87, height 5\' 6"  (1.676 m), weight 170 lb (77.1 kg).  Physical Examination: General appearance - alert, well appearing, and in no distress Mental status - alert, oriented to person, place, and time Abdomen -intestinal spasms secondary to abdominal adhesions, chronic. Watery bowel sounds, sharp grabbing pains Pelvic -  VULVA: normal appearing vulva with no masses, tenderness or lesions,  VAGINA: normal appearing vagina with normal color and discharge, no lesions, some brownish discharge  CERVIX: normal appearing cervix without discharge or lesions,  UTERUS: uterus is normal size, shape, consistency and nontender,  ADNEXA:Right adnexa has some fullness  Wet Prep/KOH done: KOH neg for yeast Negative for whiff Wet prep is positive for occasional clue cell Negative for trich  Discussion: Pt's previous U/S results were discussed with pt.  Thin 6.8 mm symmetric endometrium noted on u/s earlier this yr.Options to treat her chronic bleeding were discussed, such as endometrial ablation, IUD, progesterone-only pills to stop periods.  Pt's intestinal spasms were also discussed. Pt  is not happy with them occurring and wants to find a solution.   Assessment & Plan:   A:  1. AUB 2. Intestinal spasms secondary to abdominal adhesions, chronic with spasmodic pain  P:  1. Pt will call after she begins menstruating to schedule IUD insertion     By signing my name below, I, Izna Ahmed, attest that this documentation has been prepared under the direction and in the presence of Jonnie Kind, MD. Electronically Signed: Jabier Gauss, Medical Scribe. 09/03/17. 2:42 PM.  I personally performed the services described in this documentation, which was SCRIBED in my presence. The recorded information has been reviewed and considered accurate. It has been edited as necessary during review. Jonnie Kind, MD

## 2017-09-09 ENCOUNTER — Ambulatory Visit (HOSPITAL_COMMUNITY): Payer: Self-pay | Admitting: Psychiatry

## 2017-09-15 DIAGNOSIS — G8929 Other chronic pain: Secondary | ICD-10-CM | POA: Diagnosis not present

## 2017-09-15 DIAGNOSIS — M961 Postlaminectomy syndrome, not elsewhere classified: Secondary | ICD-10-CM | POA: Diagnosis not present

## 2017-09-15 DIAGNOSIS — M1711 Unilateral primary osteoarthritis, right knee: Secondary | ICD-10-CM | POA: Diagnosis not present

## 2017-09-15 DIAGNOSIS — M545 Low back pain: Secondary | ICD-10-CM | POA: Diagnosis not present

## 2017-09-15 DIAGNOSIS — G894 Chronic pain syndrome: Secondary | ICD-10-CM | POA: Diagnosis not present

## 2017-09-16 ENCOUNTER — Ambulatory Visit: Payer: Self-pay | Admitting: Family Medicine

## 2017-09-23 ENCOUNTER — Ambulatory Visit (HOSPITAL_COMMUNITY): Payer: Self-pay | Admitting: Psychiatry

## 2017-09-23 ENCOUNTER — Telehealth: Payer: Self-pay | Admitting: *Deleted

## 2017-09-23 NOTE — Telephone Encounter (Signed)
Patient called and canceled appointment for tomorrow because her billing is going to [place] which she doesn't have, she only has [place]. Patient states [employer] told her not to see the doctor unless it is a urgent need until this billing situation is cleared. Patient request for Velna Hatchet to call her regarding a prescriptions [phone]

## 2017-09-23 NOTE — Telephone Encounter (Signed)
Called patient and left message for them to return call at the office   

## 2017-09-24 ENCOUNTER — Encounter: Payer: Self-pay | Admitting: Family Medicine

## 2017-09-24 ENCOUNTER — Ambulatory Visit (HOSPITAL_COMMUNITY): Payer: Self-pay | Admitting: Psychiatry

## 2017-09-26 ENCOUNTER — Other Ambulatory Visit: Payer: Self-pay | Admitting: Family Medicine

## 2017-10-05 ENCOUNTER — Other Ambulatory Visit: Payer: Self-pay | Admitting: Internal Medicine

## 2017-10-06 ENCOUNTER — Other Ambulatory Visit: Payer: Self-pay | Admitting: Family Medicine

## 2017-10-06 NOTE — Telephone Encounter (Signed)
Seen [phone] 12 18 

## 2017-10-18 ENCOUNTER — Other Ambulatory Visit: Payer: Self-pay | Admitting: Family Medicine

## 2017-10-20 ENCOUNTER — Telehealth (HOSPITAL_COMMUNITY): Payer: Self-pay | Admitting: *Deleted

## 2017-10-20 NOTE — Telephone Encounter (Signed)
phone call from patient, said she would like an appointment before 10/21/17.   She said she is going out of town on 10/21/17 with her children.  When asked her when will she be available to see the provider, she asked what do we have for 11/18/17, she then said "if I make it until then".   When I confirmed her address, she said she did not mean anything by saying "if I make it until then".  The patient's husband was with her and I spoke with him.   He said he was with her, she is fine and she did not mean anything by what she said.

## 2017-10-24 ENCOUNTER — Other Ambulatory Visit: Payer: Self-pay | Admitting: Family Medicine

## 2017-10-24 ENCOUNTER — Other Ambulatory Visit: Payer: Self-pay | Admitting: Obstetrics and Gynecology

## 2017-10-29 ENCOUNTER — Telehealth: Payer: Self-pay | Admitting: *Deleted

## 2017-10-29 NOTE — Telephone Encounter (Signed)
I saw in Epic that pt had requested a refill on Bactrim. I called pt to see what symptoms she was having. Pt states she has a strong odor with her urine. I advised she needs to be seen. Pt is in [place] and won't be back until 11/12/17. I advised to be seen at urgent care close by. Pt voiced understanding. [place]

## 2017-10-30 MED ORDER — TRIAMTERENE-HCTZ 37.5-25 MG PO TABS: ORAL_TABLET | ORAL | 1 refills | Status: DC

## 2017-10-30 MED ORDER — CLOTRIMAZOLE-BETAMETHASONE 1-0.05 % EX CREA: TOPICAL_CREAM | Freq: Two times a day (BID) | CUTANEOUS | 3 refills | Status: DC

## 2017-10-30 MED ORDER — LORATADINE 10 MG PO TABS: 10.0000 mg | ORAL_TABLET | Freq: Every day | ORAL | 1 refills | Status: DC

## 2017-10-30 MED ORDER — CLOTRIMAZOLE-BETAMETHASONE 1-0.05 % EX CREA: 1.0000 "application " | TOPICAL_CREAM | Freq: Two times a day (BID) | CUTANEOUS | 3 refills | Status: DC | PRN

## 2017-10-30 MED ORDER — ALBUTEROL SULFATE (2.5 MG/3ML) 0.083% IN NEBU: 2.5000 mg | INHALATION_SOLUTION | Freq: Four times a day (QID) | RESPIRATORY_TRACT | 3 refills | Status: DC | PRN

## 2017-11-10 ENCOUNTER — Other Ambulatory Visit (HOSPITAL_COMMUNITY): Payer: Self-pay | Admitting: Psychiatry

## 2017-11-13 ENCOUNTER — Encounter (HOSPITAL_COMMUNITY): Payer: Self-pay | Admitting: Psychiatry

## 2017-11-13 ENCOUNTER — Ambulatory Visit (INDEPENDENT_AMBULATORY_CARE_PROVIDER_SITE_OTHER): Payer: Medicare HMO | Admitting: Psychiatry

## 2017-11-13 VITALS — BP 121/77 | HR 90 | Ht 66.0 in | Wt 171.0 lb

## 2017-11-13 DIAGNOSIS — Z830 Family history of human immunodeficiency virus [HIV] disease: Secondary | ICD-10-CM | POA: Diagnosis not present

## 2017-11-13 DIAGNOSIS — M549 Dorsalgia, unspecified: Secondary | ICD-10-CM

## 2017-11-13 DIAGNOSIS — R69 Illness, unspecified: Secondary | ICD-10-CM | POA: Diagnosis not present

## 2017-11-13 DIAGNOSIS — F419 Anxiety disorder, unspecified: Secondary | ICD-10-CM

## 2017-11-13 DIAGNOSIS — Z818 Family history of other mental and behavioral disorders: Secondary | ICD-10-CM | POA: Diagnosis not present

## 2017-11-13 DIAGNOSIS — Z811 Family history of alcohol abuse and dependence: Secondary | ICD-10-CM | POA: Diagnosis not present

## 2017-11-13 DIAGNOSIS — Z813 Family history of other psychoactive substance abuse and dependence: Secondary | ICD-10-CM | POA: Diagnosis not present

## 2017-11-13 DIAGNOSIS — M2550 Pain in unspecified joint: Secondary | ICD-10-CM

## 2017-11-13 DIAGNOSIS — Z736 Limitation of activities due to disability: Secondary | ICD-10-CM

## 2017-11-13 DIAGNOSIS — R450 Nervousness: Secondary | ICD-10-CM | POA: Diagnosis not present

## 2017-11-13 DIAGNOSIS — F3175 Bipolar disorder, in partial remission, most recent episode depressed: Secondary | ICD-10-CM | POA: Diagnosis not present

## 2017-11-13 MED ORDER — ESCITALOPRAM OXALATE 20 MG PO TABS: 20.0000 mg | ORAL_TABLET | Freq: Every day | ORAL | 2 refills | Status: DC

## 2017-11-13 MED ORDER — ALPRAZOLAM 1 MG PO TABS: 1.0000 mg | ORAL_TABLET | Freq: Every day | ORAL | 2 refills | Status: DC | PRN

## 2017-11-13 MED ORDER — TRAZODONE HCL 50 MG PO TABS: 50.0000 mg | ORAL_TABLET | Freq: Every day | ORAL | 2 refills | Status: DC
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
## 2017-11-17 DIAGNOSIS — M961 Postlaminectomy syndrome, not elsewhere classified: Secondary | ICD-10-CM | POA: Diagnosis not present

## 2017-11-17 DIAGNOSIS — G8929 Other chronic pain: Secondary | ICD-10-CM | POA: Diagnosis not present

## 2017-11-17 DIAGNOSIS — G894 Chronic pain syndrome: Secondary | ICD-10-CM | POA: Diagnosis not present

## 2017-11-17 DIAGNOSIS — M1711 Unilateral primary osteoarthritis, right knee: Secondary | ICD-10-CM | POA: Diagnosis not present

## 2017-11-17 DIAGNOSIS — M545 Low back pain: Secondary | ICD-10-CM | POA: Diagnosis not present

## 2017-11-18 ENCOUNTER — Ambulatory Visit (HOSPITAL_COMMUNITY): Payer: Self-pay | Admitting: Psychiatry

## 2017-11-21 ENCOUNTER — Ambulatory Visit (HOSPITAL_COMMUNITY): Payer: Self-pay | Admitting: Psychiatry

## 2017-11-26 ENCOUNTER — Telehealth: Payer: Self-pay

## 2017-11-26 ENCOUNTER — Other Ambulatory Visit: Payer: Self-pay | Admitting: Family Medicine

## 2017-11-26 ENCOUNTER — Other Ambulatory Visit: Payer: Self-pay

## 2017-11-26 MED ORDER — TERBINAFINE HCL 250 MG PO TABS: 250.0000 mg | ORAL_TABLET | Freq: Every day | ORAL | 1 refills | Status: DC

## 2017-11-26 MED ORDER — PHENTERMINE HCL 37.5 MG PO CAPS: 37.5000 mg | ORAL_CAPSULE | ORAL | 0 refills | Status: DC

## 2017-11-26 NOTE — Telephone Encounter (Signed)
Patient states her last appt was cancelled bc of DR being out of office and she called today wanting to be seen and was scheduled for Mar. Patient was upset and I got on the phone with her and worked her in for Feb 4. She is wanting a refill of the phentermine in the capsule form and something for toenail fungus on both big toes. (states her husband has left and she is having a very hard time) I told her I would call her back after lunch and let her know if meds were going to be called in. Please advise

## 2017-11-26 NOTE — Telephone Encounter (Signed)
Phentermine x 1 month and [pls let her know that 6 weeks of terbinafine tab with 1 refill is sent to [facility] sorry to hear not doing well

## 2017-11-26 NOTE — Progress Notes (Signed)
Terbinafine

## 2017-11-27 NOTE — Telephone Encounter (Signed)
Patient made aware that phentermine sent and fungus med would be sent later yesterday evening.

## 2017-12-08 ENCOUNTER — Encounter: Payer: Self-pay | Admitting: Family Medicine

## 2017-12-08 ENCOUNTER — Ambulatory Visit (INDEPENDENT_AMBULATORY_CARE_PROVIDER_SITE_OTHER): Payer: Medicare HMO | Admitting: Family Medicine

## 2017-12-08 ENCOUNTER — Ambulatory Visit (HOSPITAL_COMMUNITY)
Admission: RE | Admit: 2017-12-08 | Discharge: 2017-12-08 | Disposition: A | Discharge status: Home / Self Care | Payer: Medicare HMO | Source: Ambulatory Visit | Attending: Family Medicine | Admitting: Family Medicine

## 2017-12-08 VITALS — BP 110/70 | HR 73 | Resp 16 | Ht 66.0 in | Wt 173.0 lb

## 2017-12-08 DIAGNOSIS — I10 Essential (primary) hypertension: Secondary | ICD-10-CM | POA: Diagnosis not present

## 2017-12-08 DIAGNOSIS — R7303 Prediabetes: Secondary | ICD-10-CM | POA: Diagnosis not present

## 2017-12-08 DIAGNOSIS — B351 Tinea unguium: Secondary | ICD-10-CM | POA: Diagnosis not present

## 2017-12-08 DIAGNOSIS — E663 Overweight: Secondary | ICD-10-CM | POA: Diagnosis not present

## 2017-12-08 DIAGNOSIS — E559 Vitamin D deficiency, unspecified: Secondary | ICD-10-CM

## 2017-12-08 DIAGNOSIS — Z1239 Encounter for other screening for malignant neoplasm of breast: Secondary | ICD-10-CM

## 2017-12-08 DIAGNOSIS — Z1231 Encounter for screening mammogram for malignant neoplasm of breast: Secondary | ICD-10-CM | POA: Insufficient documentation

## 2017-12-08 DIAGNOSIS — R69 Illness, unspecified: Secondary | ICD-10-CM | POA: Diagnosis not present

## 2017-12-08 DIAGNOSIS — R87810 Cervical high risk human papillomavirus (HPV) DNA test positive: Secondary | ICD-10-CM

## 2017-12-08 MED ORDER — CICLOPIROX 8 % EX SOLN: Freq: Every day | CUTANEOUS | 0 refills | Status: DC

## 2017-12-08 MED ORDER — CLOTRIMAZOLE-BETAMETHASONE 1-0.05 % EX CREA: TOPICAL_CREAM | Freq: Two times a day (BID) | CUTANEOUS | 5 refills | Status: DC

## 2017-12-08 MED ORDER — ERGOCALCIFEROL 1.25 MG (50000 UT) PO CAPS: 50000.0000 [IU] | ORAL_CAPSULE | ORAL | 1 refills | Status: DC

## 2017-12-08 NOTE — Patient Instructions (Addendum)
Pap  End April   Continue tablets for nail and a topical medication will be sent also  Please get mammogram scheduled at checkout  hba1c CHEM 7 AND tsh TODAY, CBC  vIT d WEEKLY IS SENT IN

## 2017-12-08 NOTE — Progress Notes (Signed)
Jane English     MRN: [id number]      DOB: 1970/12/12   HPI Ms. Hottle is here for follow up and re-evaluation of chronic medical conditions, medication management and review of any available recent lab and radiology data.  Preventive health is updated, specifically  Cancer screening and Immunization.    The PT denies any adverse reactions to current medications since the last visit.  Broken left great toenail, x 6 weeks , right was worse than left , wants topical prep in addition to the oral medication recently prescribed, she states that the toe and the nail  are painful, no purulent drainage   C/o failure to lose weight despite appetite suppressant, wants info on the keto diet, states her daughter is having success with this   Currently stressed since her husband left her approx 5 weeks ago, says she loves him and that she made a promise to God to keep her marriage   ROS Denies recent fever or chills. Denies sinus pressure, nasal congestion, ear pain or sore throat. Denies chest congestion, productive cough or wheezing. Denies chest pains, palpitations and leg swelling Denies abdominal pain, nausea, vomiting,diarrhea or constipation.   Denies dysuria, frequency, hesitancy or incontinence. C/o chronic  joint pain,  and limitation in mobility.Managed by pain management Denies uncontrolled  headaches, chronic lower extremity numbness, and tingling. Denies uncontrolled depression, anxiety or insomnia.Currently sad however because of marital discord PE  BP 110/70   Pulse 73   Resp 16   Ht 5\' 6"  (1.676 m)   Wt 173 lb (78.5 kg)   SpO2 97%   BMI 27.92 kg/m   Patient alert and oriented and in no cardiopulmonary distress.  HEENT: No facial asymmetry, EOMI,   oropharynx pink and moist.  Neck supple no JVD, no mass.  Chest: Clear to auscultation bilaterally.  CVS: S1, S2 no murmurs, no S3.Regular rate.  ABD: Soft non tender.   Ext: No edema  MS: Decreased  ROM lumbar  spine,adequate in  shoulders, hips and knees.  Skin: Intact, no ulcerations or rash noted.right great toenail thickened and chipped distally, no erythema or drainage noted in nail bed or skin under nail, though the area is tender  Psych: Good eye contact, normal affect. Memory intact not anxious or depressed appearing.  CNS: CN 2-12 intact, power,  normal throughout.no focal deficits noted.   Assessment & Plan Onychomycosis of right great toe Continue oral medication and add topical agent per pt request  Overweight (BMI 25.0-29.9) Deteriorated. Patient re-educated about  the importance of commitment to a  minimum of 150 minutes of exercise per week.  The importance of healthy food choices with portion control discussed. Encouraged to start a food diary, count calories and to consider  joining a support group. Sample diet sheets offered. Goals set by the patient for the next several months.   Weight /BMI 12/08/2017 09/03/2017 08/13/2017  WEIGHT 173 lb 170 lb 178 lb 9.6 oz  HEIGHT 5\' 6"  5\' 6"  5\' 6"   BMI 27.92 kg/m2 27.44 kg/m2 28.83 kg/m2  Some encounter information is confidential and restricted. Go to Review Flowsheets activity to see all data.  continue phentermine and increase plant based diet   Essential hypertension Controlled, no change in medication DASH diet and commitment to daily physical activity for a minimum of 30 minutes discussed and encouraged, as a part of hypertension management. The importance of attaining a healthy weight is also discussed.  BP/Weight 12/08/2017 09/03/2017 08/13/2017 05/06/2017 04/21/2017  0/13/1438 06/11/7578  Systolic BP 728 206 015 615 379 432 761  Diastolic BP 70 80 80 62 60 64 64  Wt. (Lbs) 173 170 178.6 171 177.13 174 170.04  BMI 27.92 27.44 28.83 27.6 29.03 27.25 26.63  Some encounter information is confidential and restricted. Go to Review Flowsheets activity to see all data.       Pap smear of cervix shows high risk HPV present Repeat  pap 1`2 months from last pap, appt to be scheduled  Vitamin D deficiency Resume weekly vitamin D

## 2017-12-09 ENCOUNTER — Ambulatory Visit (INDEPENDENT_AMBULATORY_CARE_PROVIDER_SITE_OTHER): Payer: Medicare HMO

## 2017-12-09 ENCOUNTER — Encounter: Payer: Self-pay | Admitting: Family Medicine

## 2017-12-09 DIAGNOSIS — Z23 Encounter for immunization: Secondary | ICD-10-CM | POA: Diagnosis not present

## 2017-12-09 DIAGNOSIS — Z111 Encounter for screening for respiratory tuberculosis: Secondary | ICD-10-CM

## 2017-12-09 LAB — CBC
HCT: 37.3 % (ref 35.0–45.0)
Hemoglobin: 12.4 g/dL (ref 11.7–15.5)
MCH: 29.6 pg (ref 27.0–33.0)
MCHC: 33.2 g/dL (ref 32.0–36.0)
MCV: 89 fL (ref 80.0–100.0)
MPV: 10.8 fL (ref 7.5–12.5)
Platelets: 303 10*3/uL (ref 140–400)
RBC: 4.19 10*6/uL (ref 3.80–5.10)
RDW: 13.9 % (ref 11.0–15.0)
WBC: 5 10*3/uL (ref 3.8–10.8)

## 2017-12-09 LAB — HEMOGLOBIN A1C
Hgb A1c MFr Bld: 6 % of total Hgb — ABNORMAL HIGH (ref <5.7)
Mean Plasma Glucose: 126 (calc)
eAG (mmol/L): 7 (calc)

## 2017-12-09 LAB — BASIC METABOLIC PANEL
BUN/Creatinine Ratio: 11 (calc) (ref 6–22)
BUN: 13 mg/dL (ref 7–25)
CALCIUM: 9.1 mg/dL (ref 8.6–10.2)
CHLORIDE: 108 mmol/L (ref 98–110)
CO2: 26 mmol/L (ref 20–32)
Creat: 1.14 mg/dL — ABNORMAL HIGH (ref 0.50–1.10)
Glucose, Bld: 97 mg/dL (ref 65–99)
Potassium: 4 mmol/L (ref 3.5–5.3)
Sodium: 139 mmol/L (ref 135–146)

## 2017-12-09 LAB — TSH: TSH: 0.73 mIU/L

## 2017-12-11 ENCOUNTER — Ambulatory Visit: Payer: Medicare HMO

## 2017-12-11 DIAGNOSIS — Z111 Encounter for screening for respiratory tuberculosis: Secondary | ICD-10-CM

## 2017-12-11 LAB — TB SKIN TEST
INDURATION: 0 mm
TB SKIN TEST: NEGATIVE

## 2017-12-12 NOTE — Progress Notes (Signed)
PPD negative and result letter given to patient

## 2017-12-14 ENCOUNTER — Encounter: Payer: Self-pay | Admitting: Family Medicine

## 2017-12-14 DIAGNOSIS — E559 Vitamin D deficiency, unspecified: Secondary | ICD-10-CM | POA: Insufficient documentation

## 2017-12-14 DIAGNOSIS — B351 Tinea unguium: Secondary | ICD-10-CM | POA: Insufficient documentation

## 2017-12-14 NOTE — Assessment & Plan Note (Signed)
Resume weekly vitamin D

## 2017-12-14 NOTE — Assessment & Plan Note (Signed)
Deteriorated. Patient re-educated about  the importance of commitment to a  minimum of 150 minutes of exercise per week.  The importance of healthy food choices with portion control discussed. Encouraged to start a food diary, count calories and to consider  joining a support group. Sample diet sheets offered. Goals set by the patient for the next several months.   Weight /BMI 12/08/2017 09/03/2017 08/13/2017  WEIGHT 173 lb 170 lb 178 lb 9.6 oz  HEIGHT 5\' 6"  5\' 6"  5\' 6"   BMI 27.92 kg/m2 27.44 kg/m2 28.83 kg/m2  Some encounter information is confidential and restricted. Go to Review Flowsheets activity to see all data.  continue phentermine and increase plant based diet

## 2017-12-14 NOTE — Assessment & Plan Note (Signed)
Repeat pap 1`2 months from last pap, appt to be scheduled

## 2017-12-14 NOTE — Assessment & Plan Note (Signed)
Continue oral medication and add topical agent per pt request

## 2017-12-14 NOTE — Assessment & Plan Note (Signed)
Controlled, no change in medication DASH diet and commitment to daily physical activity for a minimum of 30 minutes discussed and encouraged, as a part of hypertension management. The importance of attaining a healthy weight is also discussed.  BP/Weight 12/08/2017 09/03/2017 08/13/2017 05/06/2017 04/21/2017 7/51/7001 05/07/9448  Systolic BP 675 916 384 665 993 570 177  Diastolic BP 70 80 80 62 60 64 64  Wt. (Lbs) 173 170 178.6 171 177.13 174 170.04  BMI 27.92 27.44 28.83 27.6 29.03 27.25 26.63  Some encounter information is confidential and restricted. Go to Review Flowsheets activity to see all data.

## 2017-12-18 ENCOUNTER — Ambulatory Visit (HOSPITAL_COMMUNITY): Payer: Self-pay | Admitting: Psychiatry

## 2017-12-26 ENCOUNTER — Ambulatory Visit (INDEPENDENT_AMBULATORY_CARE_PROVIDER_SITE_OTHER): Payer: Medicare HMO | Admitting: Family Medicine

## 2017-12-26 ENCOUNTER — Other Ambulatory Visit: Payer: Self-pay

## 2017-12-26 ENCOUNTER — Encounter: Payer: Self-pay | Admitting: Family Medicine

## 2017-12-26 VITALS — BP 122/80 | HR 92 | Temp 99.0°F | Resp 18 | Ht 66.0 in | Wt 165.0 lb

## 2017-12-26 DIAGNOSIS — J328 Other chronic sinusitis: Secondary | ICD-10-CM

## 2017-12-26 DIAGNOSIS — J45991 Cough variant asthma: Secondary | ICD-10-CM

## 2017-12-26 MED ORDER — PROMETHAZINE-DM 6.25-15 MG/5ML PO SYRP: 5.0000 mL | ORAL_SOLUTION | Freq: Four times a day (QID) | ORAL | 0 refills | Status: DC | PRN

## 2017-12-26 MED ORDER — CIPROFLOXACIN HCL 500 MG PO TABS: 500.0000 mg | ORAL_TABLET | Freq: Two times a day (BID) | ORAL | 0 refills | Status: DC

## 2017-12-26 NOTE — Patient Instructions (Signed)
Push fluids Use humidifier Take antibiotic as directed Continue usual inhalers Continue the Flonase Cough medicine is sent to the pharmacy  Call if not improved by Monday

## 2017-12-26 NOTE — Progress Notes (Signed)
Chief Complaint  Patient presents with  . Sinusitis    x 4 days  Patient of Dr. Tula Nakayama Seen today for an acute illness She states she does have underlying asthma and allergy She feels prone to sinus infections.  She states she has had multiple For the last 4-5 days she has had sinus pressure, sinus drainage, congestion and pain, and headache only on the left side of her head.  Her left nostril is completely blocked.  She is getting scant yellow drainage.  Some blood.  She feels pressure in her left ear.  She has postnasal drip and coughing.  She is increased shortness of breath and increased use of her albuterol.  She states she needs antibiotics to clear these infections.  I advised her that many of them are caused by viruses, however, with underlying allergies and asthma antibiotics are not unreasonable.  She prefers not to take prednisone.  She agrees to use her prednisone inhaler and nasal spray.  No nausea vomiting or diarrhea.   Patient Active Problem List   Diagnosis Date Noted  . Onychomycosis of right great toe 12/14/2017  . Vitamin D deficiency 12/14/2017  . Bilateral ankle pain 04/16/2017  . Amenorrhea 10/15/2016  . Abnormal vaginal Pap smear 10/14/2016  . Paresthesia 05/06/2016  . Back pain with radiation 04/04/2016  . Overweight (BMI 25.0-29.9) 03/25/2016  . Neuropathic pain of finger of right hand 12/31/2015  . Pap smear of cervix shows high risk HPV present 08/08/2015  . Cyst of left ovary 02/06/2015  . Anovulation 02/06/2015  . Secondary amenorrhea 02/06/2015  . Intractable chronic migraine without aura and without status migrainosus 12/21/2014  . Migraine 09/21/2014  . Anemia 10/11/2013  . H. pylori duodenitis 07/07/2013  . PTSD (post-traumatic stress disorder) 03/26/2013  . Insomnia 03/18/2013  . Seasonal allergies 12/10/2012  . GERD (gastroesophageal reflux disease) 10/29/2012  . Chronic pain syndrome 10/06/2012  . Postlaminectomy syndrome  10/06/2012  . Cervical neck pain with evidence of disc disease 04/13/2012  . Allergic rhinitis 04/17/2010  . Cough variant asthma 03/04/2010  . Bipolar disorder ([place]) 01/08/2009  . Prediabetes 01/05/2009  . Essential hypertension 11/27/2007    Outpatient Encounter Medications as of 12/26/2017  Medication Sig  . albuterol (PROVENTIL) (2.5 MG/3ML) 0.083% nebulizer solution Take 3 mLs (2.5 mg total) by nebulization every 6 (six) hours as needed for wheezing or shortness of breath.  . ALPRAZolam (XANAX) 1 MG tablet Take 1 tablet (1 mg total) by mouth daily as needed for anxiety.  Marland Kitchen amLODipine (NORVASC) 10 MG tablet Take 10 mg by mouth daily.  Marland Kitchen azelastine (ASTELIN) 0.1 % nasal spray USE 2 SPRAYS IN EACH NOSTRIL TWICE A DAY  . Beclomethasone Diprop HFA (QVAR REDIHALER) 40 MCG/ACT AERB Inhale 2 puffs into the lungs 2 (two) times daily.  . Botulinum Toxin Type A 200 units SOLR Inject 200 Units as directed every 3 (three) months.  . cetirizine (ZYRTEC) 10 MG tablet Take 10 mg by mouth daily.  . cholecalciferol (VITAMIN D) 1000 units tablet Take 1,000 Units by mouth daily.  . ciclopirox (PENLAC) 8 % solution Apply topically at bedtime. Apply over nail and surrounding skin. Apply daily over previous coat. After seven (7) days, may remove with alcohol and continue cycle.  . cloNIDine (CATAPRES) 0.1 MG tablet TAKE 1 TABLET AT BEDTIME  . clotrimazole-betamethasone (LOTRISONE) cream Apply 1 application topically 2 (two) times daily as needed (for rash).  . clotrimazole-betamethasone (LOTRISONE) cream Apply topically 2 (two) times  daily.  . cyclobenzaprine (FLEXERIL) 10 MG tablet One tablet at bedtime as needed, for spasm  . diclofenac sodium (VOLTAREN) 1 % GEL Apply 4 g topically 4 (four) times daily as needed (for pain).   Marland Kitchen ergocalciferol (VITAMIN D2) 50000 units capsule Take 1 capsule (50,000 Units total) by mouth once a week. One capsule once weekly  . escitalopram (LEXAPRO) 20 MG tablet Take 1 tablet  (20 mg total) by mouth daily.  . fluticasone (FLONASE) 50 MCG/ACT nasal spray Place 2 sprays into both nostrils daily as needed for rhinitis.  Marland Kitchen ipratropium (ATROVENT) 0.02 % nebulizer solution Take 0.5 mg by nebulization every 6 (six) hours as needed for wheezing or shortness of breath.  . linaclotide (LINZESS) 290 MCG CAPS capsule Take 1 capsule (290 mcg total) by mouth daily before breakfast.  . loratadine (CLARITIN) 10 MG tablet Take 1 tablet (10 mg total) by mouth daily.  Marland Kitchen lubiprostone (AMITIZA) 24 MCG capsule Take 1 capsule (24 mcg total) by mouth 2 (two) times daily with a meal.  . metroNIDAZOLE (FLAGYL) 500 MG tablet Take 1 tablet (500 mg total) by mouth 2 (two) times daily.  . montelukast (SINGULAIR) 10 MG tablet TAKE 1 TABLET BY MOUTH AT BEDTIME  . Naproxen Sodium (ALEVE PO) Take by mouth as needed.  Marland Kitchen olopatadine (PATANOL) 0.1 % ophthalmic solution Place 1 drop into both eyes 2 (two) times daily.  . ondansetron (ZOFRAN-ODT) 4 MG disintegrating tablet Take 1 tablet by mouth every 12 (twelve) hours as needed for nausea/vomiting.  . Oxycodone HCl 10 MG TABS Take 10 mg by mouth every 12 (twelve) hours as needed (for pain).   . pantoprazole (PROTONIX) 40 MG tablet TAKE 1 TABLET BY MOUTH EVERY DAY  . phentermine (ADIPEX-P) 37.5 MG tablet Take 1 tablet (37.5 mg total) by mouth daily before breakfast.  . phentermine 37.5 MG capsule Take 1 capsule (37.5 mg total) by mouth every morning.  . polyethylene glycol powder (GLYCOLAX/MIRALAX) powder Take 17 grams (1 capful) dissolved in at least 8 ounces water juice and drink 3 times daily  . potassium chloride 20 MEQ/15ML (10%) SOLN Take 20 mEq by mouth 2 (two) times daily.  . rizatriptan (MAXALT-MLT) 10 MG disintegrating tablet Take 10 mg by mouth as needed for migraine. May repeat in 2 hours if needed  . spironolactone (ALDACTONE) 25 MG tablet TAKE 1 TABLET BY MOUTH EVERY DAY  . SUMAtriptan (IMITREX) 6 MG/0.5ML SOLN injection Inject 0.5 mLs (6 mg  total) into the skin every 2 (two) hours as needed for migraine or headache. May repeat in 2 hours if headache persists or recurs.  . terbinafine (LAMISIL) 250 MG tablet Take 1 tablet (250 mg total) by mouth daily.  Marland Kitchen topiramate (TOPAMAX) 50 MG tablet Take 2 tablets (100 mg total) by mouth 2 (two) times daily.  . traZODone (DESYREL) 50 MG tablet Take 1 tablet (50 mg total) by mouth at bedtime.  . triamterene-hydrochlorothiazide (MAXZIDE-25) 37.5-25 MG tablet 1 tablet daily  . [DISCONTINUED] sulfamethoxazole-trimethoprim (BACTRIM) 400-80 MG tablet Take 1 tablet by mouth 2 (two) times daily. Twice daily for uti  . ciprofloxacin (CIPRO) 500 MG tablet Take 1 tablet (500 mg total) by mouth 2 (two) times daily.  . potassium chloride (K-DUR,KLOR-CON) 10 MEQ tablet Take by mouth.  . promethazine-dextromethorphan (PROMETHAZINE-DM) 6.25-15 MG/5ML syrup Take 5 mLs by mouth 4 (four) times daily as needed for cough.   No facility-administered encounter medications on file as of 12/26/2017.     Allergies  Allergen Reactions  .  Amoxicillin Hives, Shortness Of Breath and Other (See Comments)    Has patient had a PCN reaction causing immediate rash, facial/tongue/throat swelling, SOB or lightheadedness with hypotension: Yes Has patient had a PCN reaction causing severe rash involving mucus membranes or skin necrosis: No Has patient had a PCN reaction that required hospitalization No Has patient had a PCN reaction occurring within the last 10 years: Yes If all of the above answers are "NO", then may proceed with Cephalosporin use.  Marland Kitchen Neurontin [Gabapentin] Nausea And Vomiting and Other (See Comments)    Reaction:  Sleep walking/hallucinations   . Penicillins Hives, Shortness Of Breath and Other (See Comments)    Has patient had a PCN reaction causing immediate rash, facial/tongue/throat swelling, SOB or lightheadedness with hypotension: Yes Has patient had a PCN reaction causing severe rash involving mucus  membranes or skin necrosis: No Has patient had a PCN reaction that required hospitalization No Has patient had a PCN reaction occurring within the last 10 years: Yes If all of the above answers are "NO", then may proceed with Cephalosporin use.  . Pregabalin Anaphylaxis, Shortness Of Breath, Diarrhea and Swelling    Lyrica  . Tramadol Other (See Comments)    Reaction:  Makes pt delusional   . Latex Rash    Review of Systems  Constitutional: Positive for activity change, chills and fatigue. Negative for appetite change, fever and unexpected weight change.  HENT: Positive for congestion, ear pain, postnasal drip, rhinorrhea, sinus pressure and sinus pain. Negative for sore throat.   Eyes: Negative for redness and visual disturbance.  Respiratory: Positive for cough and wheezing.   Cardiovascular: Negative for chest pain and palpitations.  Gastrointestinal: Negative for abdominal pain, diarrhea, nausea and vomiting.  Musculoskeletal: Positive for back pain.       Chronic pain  Neurological: Positive for headaches. Negative for dizziness.    BP 122/80 (BP Location: Right Arm, Patient Position: Sitting, Cuff Size: Normal)   Pulse 92   Temp 99 F (37.2 C) (Oral)   Resp 18   Ht 5\' 6"  (1.676 m)   Wt 165 lb (74.8 kg)   SpO2 96%   BMI 26.63 kg/m   Physical Exam  Constitutional: She is oriented to person, place, and time. She appears well-developed and well-nourished. No distress.  Mildly sedated, slow responses  HENT:  Head: Normocephalic and atraumatic.  Right Ear: External ear normal.  Left Ear: External ear normal.  Mouth/Throat: Oropharynx is clear and moist.  Mucous membranes left nostril swollen and erythematous, right nostril is clear.  Sinus tenderness on the left maxillary.  TMs are clear bilaterally  Eyes: Pupils are equal, round, and reactive to light.  Neck: Normal range of motion. Thyromegaly present.  Cardiovascular: Normal rate, regular rhythm and normal heart  sounds.  Pulmonary/Chest: Effort normal and breath sounds normal. She has no wheezes.  Lungs are clear  Lymphadenopathy:    She has no cervical adenopathy.  Neurological: She is alert and oriented to person, place, and time.    ASSESSMENT/PLAN:  Cough variant asthma  Other chronic sinusitis    Patient Instructions  Push fluids Use humidifier Take antibiotic as directed Continue usual inhalers Continue the Flonase Cough medicine is sent to the pharmacy  Call if not improved by Monday   Raylene Everts, MD

## 2018-01-08 ENCOUNTER — Ambulatory Visit (HOSPITAL_COMMUNITY): Payer: Self-pay | Admitting: Psychiatry

## 2018-01-12 ENCOUNTER — Telehealth: Payer: Self-pay

## 2018-01-12 NOTE — Telephone Encounter (Signed)
Patient requesting something else for her toenails. She has took the pills and used the cream and they are no better. Please advise

## 2018-01-12 NOTE — Telephone Encounter (Signed)
Pt said she needs you to call her ASAP.  She would not tell me anything else.

## 2018-01-12 NOTE — Telephone Encounter (Signed)
I recommend Dermatolgy to evaluate , and treat, as this is what is recommended for fungal infection, so since no better, will need  Specialist to eval, pls ask if she wants local Derm or [place] and enter referral wherevr she wants to go, likely [facility], I will sign, if [place], Dr Jerrell [place] I recommend

## 2018-01-13 ENCOUNTER — Encounter: Payer: Self-pay | Admitting: Internal Medicine

## 2018-01-13 ENCOUNTER — Telehealth: Payer: Self-pay | Admitting: Internal Medicine

## 2018-01-13 ENCOUNTER — Ambulatory Visit (INDEPENDENT_AMBULATORY_CARE_PROVIDER_SITE_OTHER): Payer: Medicare HMO | Admitting: Psychiatry

## 2018-01-13 ENCOUNTER — Encounter (HOSPITAL_COMMUNITY): Payer: Self-pay | Admitting: Psychiatry

## 2018-01-13 ENCOUNTER — Other Ambulatory Visit: Payer: Self-pay | Admitting: Family Medicine

## 2018-01-13 ENCOUNTER — Other Ambulatory Visit (HOSPITAL_COMMUNITY): Payer: Self-pay | Admitting: Psychiatry

## 2018-01-13 VITALS — BP 126/76 | HR 98 | Ht 66.0 in | Wt 171.0 lb

## 2018-01-13 DIAGNOSIS — M549 Dorsalgia, unspecified: Secondary | ICD-10-CM | POA: Diagnosis not present

## 2018-01-13 DIAGNOSIS — Z736 Limitation of activities due to disability: Secondary | ICD-10-CM | POA: Diagnosis not present

## 2018-01-13 DIAGNOSIS — Z830 Family history of human immunodeficiency virus [HIV] disease: Secondary | ICD-10-CM | POA: Diagnosis not present

## 2018-01-13 DIAGNOSIS — Z811 Family history of alcohol abuse and dependence: Secondary | ICD-10-CM

## 2018-01-13 DIAGNOSIS — F3175 Bipolar disorder, in partial remission, most recent episode depressed: Secondary | ICD-10-CM | POA: Diagnosis not present

## 2018-01-13 DIAGNOSIS — G8929 Other chronic pain: Secondary | ICD-10-CM | POA: Diagnosis not present

## 2018-01-13 DIAGNOSIS — Z818 Family history of other mental and behavioral disorders: Secondary | ICD-10-CM | POA: Diagnosis not present

## 2018-01-13 DIAGNOSIS — Z813 Family history of other psychoactive substance abuse and dependence: Secondary | ICD-10-CM

## 2018-01-13 DIAGNOSIS — R109 Unspecified abdominal pain: Secondary | ICD-10-CM

## 2018-01-13 DIAGNOSIS — R69 Illness, unspecified: Secondary | ICD-10-CM | POA: Diagnosis not present

## 2018-01-13 DIAGNOSIS — L609 Nail disorder, unspecified: Secondary | ICD-10-CM

## 2018-01-13 MED ORDER — TRAZODONE HCL 50 MG PO TABS: 50.0000 mg | ORAL_TABLET | Freq: Every day | ORAL | 2 refills | Status: DC

## 2018-01-13 MED ORDER — ALPRAZOLAM 1 MG PO TABS: 1.0000 mg | ORAL_TABLET | Freq: Every day | ORAL | 2 refills | Status: DC | PRN

## 2018-01-13 MED ORDER — VILAZODONE HCL 40 MG PO TABS: 40.0000 mg | ORAL_TABLET | Freq: Every day | ORAL | 2 refills | Status: DC

## 2018-01-13 NOTE — Progress Notes (Signed)
BH MD/PA/NP OP Progress Note  01/13/2018 4:03 PM Jane English  MRN:  [id number]  Chief Complaint:  Chief Complaint    Depression; Anxiety; Follow-up     EXB:MWUX patient is a 47 year old black female who was living with her husband in [place].  They are currently separated.  She is on disability for degenerative disc disease and chronic pain.  She has 7 children and 4 grandchildren.  The patient returns for follow-up of treatment of depression anxiety and "mood swings."  Patient returns for follow-up today after 2 months.  She states that she is not feeling well and her stomach is hurting.  She is also reports feeling depressed and irritable in a fog and unable to focus.  She keeps forgetting things.  She does not think the Lexapro that she is taking is working for her anymore.  She denies being suicidal or any psychotic symptoms.  We discussed the switch to Viibryd which may help more with her focus Visit Diagnosis:    ICD-10-CM   1. Bipolar disorder, in partial remission, most recent episode depressed ([place]) F31.75     Past Psychiatric History: Long-term outpatient treatment  Past Medical History:  Past Medical History:  Diagnosis Date  . Anemia   . Asthma   . Asthma flare 04/09/2013  . Back pain   . Bronchitis   . Chronic abdominal pain   . Chronic constipation   . Constipation due to opioid therapy   . Depression   . Diabetes mellitus without complication ([place])   . Diabetes mellitus, type II ([place])   . DVT (deep venous thrombosis) ([place]) 2010  . GERD (gastroesophageal reflux disease)   . Heart murmur    no cardiologist  . Helicobacter pylori gastritis 06/11/2013   Colonoscopy Dr. Hilarie Fredrickson  . Hypertension   . Migraine headache   . Neuropathy   . Obesity   . Obsessive-compulsive disorder   . PSYCHOTIC D/O W/HALLUCINATIONS CONDS CLASS ELSW 03/04/2010   Qualifier: Diagnosis of  By: Moshe Cipro MD, Joycelyn Schmid    . PTSD (post-traumatic stress disorder)   . SBO (small bowel  obstruction) ([place]) 08/09/2013  . Seasonal allergies 12/10/2012  . Seizures ([place])   . Shortness of breath     Past Surgical History:  Procedure Laterality Date  . ANTERIOR CERVICAL DECOMP/DISCECTOMY FUSION  07/07/2012   Procedure: ANTERIOR CERVICAL DECOMPRESSION/DISCECTOMY FUSION 2 LEVELS;  Surgeon: Floyce Stakes, MD;  Location: [facility];  Service: Neurosurgery;  Laterality: N/A;  Cervical four-five, five - six  Anterior cervical decompression/diskectomy/fusion/plate  . APPENDECTOMY  1986  . BOWEL RESECTION N/A 07/29/2013   Procedure: serosal repair;  Surgeon: Adin Hector, MD;  Location: [facility];  Service: General;  Laterality: N/A;  . CARPAL TUNNEL RELEASE Bilateral   . LAPAROSCOPY N/A 07/29/2013   Procedure: diagnostic laporoscopy;  Surgeon: Adin Hector, MD;  Location: [facility];  Service: General;  Laterality: N/A;  . LAPAROSCOPY N/A 08/16/2013   Procedure: LAPAROSCOPY DIAGNOSTIC/LYSIS OF ADHESIONS;  Surgeon: Adin Hector, MD;  Location: [facility];  Service: General;  Laterality: N/A;  . LAPAROTOMY N/A 08/16/2013   Procedure: EXPLORATORY LAPAROTOMY/SMALL BOWEL RESECTION (JEJUNUM);  Surgeon: Adin Hector, MD;  Location: [facility];  Service: General;  Laterality: N/A;  . LUMBAR SPINE SURGERY  2010   x 3  . LYSIS OF ADHESION  2003   Dr. Irving Shows  . LYSIS OF ADHESION N/A 07/29/2013   Procedure: LYSIS OF ADHESION;  Surgeon: Adin Hector, MD;  Location: [facility];  Service: General;  Laterality: N/A;  . OOPHORECTOMY    . PARTIAL HYSTERECTOMY  1990s?   [place], Estelle  . SPINAL CORD STIMULATOR IMPLANT    . TRIGGER FINGER RELEASE  2009   right pinkie finger  . TUBAL LIGATION  1994    Family Psychiatric History: See below  Family History:  Family History  Problem Relation Age of Onset  . Lung cancer Father   . Stomach cancer Father   . Esophageal cancer Father   . Alcohol abuse Father   . Mental illness Father   . Diabetes Sister   . Hypertension Sister   . Bipolar  disorder Sister   . Schizophrenia Sister   . Diabetes Sister   . Alcohol abuse Brother   . Hypertension Brother   . Kidney disease Brother   . Diabetes Brother   . Drug abuse Brother   . Mental illness Brother   . Alcohol abuse Brother   . Alcohol abuse Brother   . Hypertension Brother   . Diabetes Brother   . Alcohol abuse Brother   . Physical abuse Mother   . Alcohol abuse Mother   . Cirrhosis Mother   . Mental illness Brother        in [place]  . Drug abuse Sister   . HIV Sister   . Alcohol abuse Brother   . Pneumonia Sister        died as a baby  . Alcohol abuse Brother   . Bipolar disorder Brother   . Bipolar disorder Daughter   . Bipolar disorder Son   . Bipolar disorder Son   . Hypertension Brother   . Bipolar disorder Brother   . Drug abuse Brother   . Alcohol abuse Brother   . Bipolar disorder Brother   . ADD / ADHD Neg Hx   . Anxiety disorder Neg Hx   . Dementia Neg Hx   . Depression Neg Hx   . OCD Neg Hx   . Seizures Neg Hx   . Paranoid behavior Neg Hx   . Colon cancer Neg Hx     Social History:  Social History   Socioeconomic History  . Marital status: Married    Spouse name: None  . Number of children: None  . Years of education: None  . Highest education level: None  Social Needs  . Financial resource strain: None  . Food insecurity - worry: None  . Food insecurity - inability: None  . Transportation needs - medical: None  . Transportation needs - non-medical: None  Occupational History  . None  Tobacco Use  . Smoking status: Never Smoker  . Smokeless tobacco: Never Used  Substance and Sexual Activity  . Alcohol use: No  . Drug use: No  . Sexual activity: Yes    Birth control/protection: Surgical    Comment: tubal  Other Topics Concern  . None  Social History Narrative  . None    Allergies:  Allergies  Allergen Reactions  . Amoxicillin Hives, Shortness Of Breath and Other (See Comments)    Has patient had a PCN reaction  causing immediate rash, facial/tongue/throat swelling, SOB or lightheadedness with hypotension: Yes Has patient had a PCN reaction causing severe rash involving mucus membranes or skin necrosis: No Has patient had a PCN reaction that required hospitalization No Has patient had a PCN reaction occurring within the last 10 years: Yes If all of the above answers are "NO", then may proceed with  Cephalosporin use.  Marland Kitchen Neurontin [Gabapentin] Nausea And Vomiting and Other (See Comments)    Reaction:  Sleep walking/hallucinations   . Penicillins Hives, Shortness Of Breath and Other (See Comments)    Has patient had a PCN reaction causing immediate rash, facial/tongue/throat swelling, SOB or lightheadedness with hypotension: Yes Has patient had a PCN reaction causing severe rash involving mucus membranes or skin necrosis: No Has patient had a PCN reaction that required hospitalization No Has patient had a PCN reaction occurring within the last 10 years: Yes If all of the above answers are "NO", then may proceed with Cephalosporin use.  . Pregabalin Anaphylaxis, Shortness Of Breath, Diarrhea and Swelling    Lyrica  . Tramadol Other (See Comments)    Reaction:  Makes pt delusional   . Latex Rash    Metabolic Disorder Labs: Lab Results  Component Value Date   HGBA1C 6.0 (H) 12/08/2017   MPG 126 12/08/2017   MPG 120 06/09/2017   No results found for: PROLACTIN Lab Results  Component Value Date   CHOL 152 06/09/2017   TRIG 132 06/09/2017   HDL 53 06/09/2017   CHOLHDL 2.9 06/09/2017   VLDL 26 06/09/2017   LDLCALC 73 06/09/2017   LDLCALC 105 07/31/2015   Lab Results  Component Value Date   TSH 0.73 12/08/2017   TSH 0.85 11/11/2016    Therapeutic Level Labs: No results found for: LITHIUM No results found for: VALPROATE No components found for:  CBMZ  Current Medications: Current Outpatient Medications  Medication Sig Dispense Refill  . albuterol (PROVENTIL) (2.5 MG/3ML) 0.083%  nebulizer solution Take 3 mLs (2.5 mg total) by nebulization every 6 (six) hours as needed for wheezing or shortness of breath. 75 mL 3  . ALPRAZolam (XANAX) 1 MG tablet Take 1 tablet (1 mg total) by mouth daily as needed for anxiety. 30 tablet 2  . amLODipine (NORVASC) 10 MG tablet Take 10 mg by mouth daily.    Marland Kitchen azelastine (ASTELIN) 0.1 % nasal spray USE 2 SPRAYS IN EACH NOSTRIL TWICE A DAY 30 mL 5  . Beclomethasone Diprop HFA (QVAR REDIHALER) 40 MCG/ACT AERB Inhale 2 puffs into the lungs 2 (two) times daily. 10.6 g 5  . Botulinum Toxin Type A 200 units SOLR Inject 200 Units as directed every 3 (three) months. 1 each 3  . cetirizine (ZYRTEC) 10 MG tablet Take 10 mg by mouth daily.    . cholecalciferol (VITAMIN D) 1000 units tablet Take 1,000 Units by mouth daily.    . ciclopirox (PENLAC) 8 % solution Apply topically at bedtime. Apply over nail and surrounding skin. Apply daily over previous coat. After seven (7) days, may remove with alcohol and continue cycle. 6.6 mL 0  . cloNIDine (CATAPRES) 0.1 MG tablet TAKE 1 TABLET AT BEDTIME 90 tablet 1  . clotrimazole-betamethasone (LOTRISONE) cream Apply 1 application topically 2 (two) times daily as needed (for rash). 30 g 3  . clotrimazole-betamethasone (LOTRISONE) cream Apply topically 2 (two) times daily. 45 g 5  . cyclobenzaprine (FLEXERIL) 10 MG tablet One tablet at bedtime as needed, for spasm 30 tablet 1  . diclofenac sodium (VOLTAREN) 1 % GEL Apply 4 g topically 4 (four) times daily as needed (for pain).   5  . ergocalciferol (VITAMIN D2) 50000 units capsule Take 1 capsule (50,000 Units total) by mouth once a week. One capsule once weekly 12 capsule 1  . escitalopram (LEXAPRO) 20 MG tablet Take 1 tablet (20 mg total) by mouth daily. [place]  tablet 2  . fluticasone (FLONASE) 50 MCG/ACT nasal spray Place 2 sprays into both nostrils daily as needed for rhinitis.    Marland Kitchen ipratropium (ATROVENT) 0.02 % nebulizer solution Take 0.5 mg by nebulization every 6  (six) hours as needed for wheezing or shortness of breath.    . linaclotide (LINZESS) 290 MCG CAPS capsule Take 1 capsule (290 mcg total) by mouth daily before breakfast. 30 capsule 11  . loratadine (CLARITIN) 10 MG tablet Take 1 tablet (10 mg total) by mouth daily. 90 tablet 1  . lubiprostone (AMITIZA) 24 MCG capsule Take 1 capsule (24 mcg total) by mouth 2 (two) times daily with a meal. 60 capsule 11  . metroNIDAZOLE (FLAGYL) 500 MG tablet Take 1 tablet (500 mg total) by mouth 2 (two) times daily. 14 tablet 0  . montelukast (SINGULAIR) 10 MG tablet TAKE 1 TABLET BY MOUTH AT BEDTIME 90 tablet 1  . Naproxen Sodium (ALEVE PO) Take by mouth as needed.    Marland Kitchen olopatadine (PATANOL) 0.1 % ophthalmic solution Place 1 drop into both eyes 2 (two) times daily.    . ondansetron (ZOFRAN-ODT) 4 MG disintegrating tablet Take 1 tablet by mouth every 12 (twelve) hours as needed for nausea/vomiting.  5  . Oxycodone HCl 10 MG TABS Take 10 mg by mouth every 12 (twelve) hours as needed (for pain).   0  . pantoprazole (PROTONIX) 40 MG tablet TAKE 1 TABLET BY MOUTH EVERY DAY 90 tablet 2  . phentermine (ADIPEX-P) 37.5 MG tablet Take 1 tablet (37.5 mg total) by mouth daily before breakfast. 30 tablet 0  . phentermine 37.5 MG capsule Take 1 capsule (37.5 mg total) by mouth every morning. 30 capsule 0  . polyethylene glycol powder (GLYCOLAX/MIRALAX) powder Take 17 grams (1 capful) dissolved in at least 8 ounces water juice and drink 3 times daily 1530 g 11  . potassium chloride (K-DUR,KLOR-CON) 10 MEQ tablet Take by mouth.    . potassium chloride 20 MEQ/15ML (10%) SOLN Take 20 mEq by mouth 2 (two) times daily.    . promethazine-dextromethorphan (PROMETHAZINE-DM) 6.25-15 MG/5ML syrup Take 5 mLs by mouth 4 (four) times daily as needed for cough. 118 mL 0  . rizatriptan (MAXALT-MLT) 10 MG disintegrating tablet Take 10 mg by mouth as needed for migraine. May repeat in 2 hours if needed    . spironolactone (ALDACTONE) 25 MG  tablet TAKE 1 TABLET BY MOUTH EVERY DAY 90 tablet 1  . SUMAtriptan (IMITREX) 6 MG/0.5ML SOLN injection Inject 0.5 mLs (6 mg total) into the skin every 2 (two) hours as needed for migraine or headache. May repeat in 2 hours if headache persists or recurs. 12 vial 11  . terbinafine (LAMISIL) 250 MG tablet Take 1 tablet (250 mg total) by mouth daily. 42 tablet 1  . topiramate (TOPAMAX) 50 MG tablet Take 2 tablets (100 mg total) by mouth 2 (two) times daily. 360 tablet 1  . traZODone (DESYREL) 50 MG tablet Take 1 tablet (50 mg total) by mouth at bedtime. 90 tablet 2  . triamterene-hydrochlorothiazide (MAXZIDE-25) 37.5-25 MG tablet 1 tablet daily 90 tablet 1  . ciprofloxacin (CIPRO) 500 MG tablet Take 1 tablet (500 mg total) by mouth 2 (two) times daily. (Patient not taking: Reported on 01/13/2018) 10 tablet 0  . Vilazodone HCl (VIIBRYD) 40 MG TABS Take 1 tablet (40 mg total) by mouth daily. 30 tablet 2   No current facility-administered medications for this visit.      Musculoskeletal: Strength & Muscle Tone:  within normal limits Gait & Station: normal Patient leans: N/A  Psychiatric Specialty Exam: Review of Systems  Gastrointestinal: Positive for abdominal pain and nausea.  Psychiatric/Behavioral: Positive for depression.  All other systems reviewed and are negative.   Blood pressure 126/76, pulse 98, height 5\' 6"  (1.676 m), weight 171 lb (77.6 kg), SpO2 98 %.Body mass index is 27.6 kg/m.  General Appearance: Casual and Fairly Groomed  Eye Contact:  Fair  Speech:  Clear and Coherent  Volume:  Normal  Mood:  Angry, Depressed and Irritable  Affect:  Constricted and Depressed  Thought Process:  Goal Directed  Orientation:  Full (Time, Place, and Person)  Thought Content: Rumination   Suicidal Thoughts:  No  Homicidal Thoughts:  No  Memory:  Immediate;   Good Recent;   Good Remote;   Fair  Judgement:  Poor  Insight:  Lacking  Psychomotor Activity:  Decreased  Concentration:   Concentration: Fair and Attention Span: Fair  Recall:  Good  Fund of Knowledge: Good  Language: Good  Akathisia:  No  Handed:  Right  AIMS (if indicated): not done  Assets:  Communication Skills Desire for Improvement Resilience Social Support Talents/Skills  ADL's:  Intact  Cognition: WNL  Sleep:  Good   Screenings: PHQ2-9     Office Visit from 12/26/2017 in [facility] from 03/05/2017 in [facility] Office Visit from 10/29/2012 in [facility]  PHQ-2 Total Score  0  2  6  PHQ-9 Total Score  No data  5  24       Assessment and Plan: Patient is a 47 year old female with a history of depression and possible bipolar disorder.  She is not feeling well today and is quite irritable and upset.  She does not think that the Lexapro is working anymore.  Therefore she will start Viibryd at 10 mg for 1 week and gradually work up to 40 mg over the next 3 weeks.  She will continue Xanax 1 mg daily for anxiety as well as trazodone 50 mg at bedtime for sleep.  She will return to see me in 4 weeks   Levonne Spiller, MD 01/13/2018, 4:04 PM

## 2018-01-13 NOTE — Telephone Encounter (Signed)
Called and advised of note from MD. Patient understood and would like to see the Doctor that Dr.Simpson recommended.  Pended referral

## 2018-01-13 NOTE — Telephone Encounter (Signed)
She is referred to Dr Jerrell Belfast

## 2018-01-13 NOTE — Telephone Encounter (Signed)
Pt states Dr. Hilarie Fredrickson is aware or her situation and states she has been told to call whenever she is having problems and he will see her. Discussed with her that he has no appointments but would be happy to send him a message. Pt reports that her surgeon "messed her up" and she is bloated at the top of her abdomen again and she is having abdominal cramps and pains. States she is taking her amitiza and linzess but it is not working as it should. Pt states usually she takes the amitiza and linzess, then puts her depends on and her bowels move like they should. States she has not had  A BM in about a week. Pt reports she does not want to wind up back in the hospital. Pt was offered an appt with PA but she only wants to see Dr. Hilarie Fredrickson. Please advise.

## 2018-01-14 ENCOUNTER — Other Ambulatory Visit: Payer: Self-pay | Admitting: Family Medicine

## 2018-01-14 DIAGNOSIS — G894 Chronic pain syndrome: Secondary | ICD-10-CM | POA: Diagnosis not present

## 2018-01-14 DIAGNOSIS — M1711 Unilateral primary osteoarthritis, right knee: Secondary | ICD-10-CM | POA: Diagnosis not present

## 2018-01-14 DIAGNOSIS — M961 Postlaminectomy syndrome, not elsewhere classified: Secondary | ICD-10-CM | POA: Diagnosis not present

## 2018-01-14 NOTE — Telephone Encounter (Signed)
Apologies for the long wait to see me Please offer her a sample bowel preparation and instruct her to use this as a purge, to get her colon empty/cleaned out Once this occurs she should resume laxative (amitiza and linzess) at previous dose and let me know if this is not working

## 2018-01-14 NOTE — Telephone Encounter (Signed)
Pt aware via mychart message 

## 2018-01-14 NOTE — Telephone Encounter (Signed)
See other, recent, telephone note

## 2018-01-15 ENCOUNTER — Ambulatory Visit (HOSPITAL_COMMUNITY): Payer: Self-pay | Admitting: Psychiatry

## 2018-01-16 DIAGNOSIS — R69 Illness, unspecified: Secondary | ICD-10-CM | POA: Diagnosis not present

## 2018-02-10 ENCOUNTER — Ambulatory Visit (HOSPITAL_COMMUNITY): Payer: Medicare HMO | Admitting: Psychiatry

## 2018-02-10 ENCOUNTER — Encounter (HOSPITAL_COMMUNITY): Payer: Self-pay | Admitting: Psychiatry

## 2018-02-10 VITALS — BP 130/83 | HR 83 | Ht 66.0 in | Wt 164.0 lb

## 2018-02-10 DIAGNOSIS — Z811 Family history of alcohol abuse and dependence: Secondary | ICD-10-CM

## 2018-02-10 DIAGNOSIS — Z813 Family history of other psychoactive substance abuse and dependence: Secondary | ICD-10-CM | POA: Diagnosis not present

## 2018-02-10 DIAGNOSIS — R69 Illness, unspecified: Secondary | ICD-10-CM | POA: Diagnosis not present

## 2018-02-10 DIAGNOSIS — Z830 Family history of human immunodeficiency virus [HIV] disease: Secondary | ICD-10-CM

## 2018-02-10 DIAGNOSIS — Z818 Family history of other mental and behavioral disorders: Secondary | ICD-10-CM | POA: Diagnosis not present

## 2018-02-10 DIAGNOSIS — F3175 Bipolar disorder, in partial remission, most recent episode depressed: Secondary | ICD-10-CM | POA: Diagnosis not present

## 2018-02-10 DIAGNOSIS — Z736 Limitation of activities due to disability: Secondary | ICD-10-CM

## 2018-02-10 MED ORDER — QUETIAPINE FUMARATE 25 MG PO TABS: 25.0000 mg | ORAL_TABLET | Freq: Every day | ORAL | 2 refills | Status: DC

## 2018-02-10 MED ORDER — ALPRAZOLAM 1 MG PO TABS: 1.0000 mg | ORAL_TABLET | Freq: Every day | ORAL | 2 refills | Status: DC | PRN

## 2018-02-10 NOTE — Progress Notes (Signed)
BH MD/PA/NP OP Progress Note  02/10/2018 3:22 PM Jane English  MRN:  [id number]  Chief Complaint:  Chief Complaint    Depression; Anxiety; Manic Behavior; Follow-up     HPI: This patient is a 47 year old black female who lives with her husband in [place].  They have been separated intermittently but he is currently back in the home.  She is on disability for degenerative disc disease and chronic pain.  She has 7 children and 4 grandchildren.  The patient returns for follow-up of treatment of depression anxiety and mood swings.  The patient returns after 4 weeks.  I had tried to start her on Viibryd and she claims that we did not send in the medication to the pharmacy and she finished the two-week dose pack and it did not help.  I showed her verification in the chart that we did send in the 40 mg dosage but she claimed that she does not want to try it again as it did not help at the lower dose.  She states that she is tired of being angry and irritable all the time and nothing is helping.  She feels like the trazodone is making her worse instead of better.  She can only be on 1 mg of Xanax a day because of being on pain medicine in the pain clinic rules.  She does not feel like other antidepressants have helped and neither did Lamictal or Tegretol.  I suggested we try very low-dose of Seroquel to help with mood swings and sleep.  She claims she was on it at "500 mg" in the past and I told her we were just going to start at 25 mg. Visit Diagnosis:    ICD-10-CM   1. Bipolar disorder, in partial remission, most recent episode depressed ([place]) F31.75     Past Psychiatric History: Long-term outpatient treatment  Past Medical History:  Past Medical History:  Diagnosis Date  . Anemia   . Asthma   . Asthma flare 04/09/2013  . Back pain   . Bronchitis   . Chronic abdominal pain   . Chronic constipation   . Constipation due to opioid therapy   . Depression   . Diabetes mellitus without  complication ([place])   . Diabetes mellitus, type II ([place])   . DVT (deep venous thrombosis) ([place]) 2010  . GERD (gastroesophageal reflux disease)   . Heart murmur    no cardiologist  . Helicobacter pylori gastritis 06/11/2013   Colonoscopy Dr. Hilarie Fredrickson  . Hypertension   . Migraine headache   . Neuropathy   . Obesity   . Obsessive-compulsive disorder   . PSYCHOTIC D/O W/HALLUCINATIONS CONDS CLASS ELSW 03/04/2010   Qualifier: Diagnosis of  By: Moshe Cipro MD, Joycelyn Schmid    . PTSD (post-traumatic stress disorder)   . SBO (small bowel obstruction) ([place]) 08/09/2013  . Seasonal allergies 12/10/2012  . Seizures ([place])   . Shortness of breath     Past Surgical History:  Procedure Laterality Date  . ANTERIOR CERVICAL DECOMP/DISCECTOMY FUSION  07/07/2012   Procedure: ANTERIOR CERVICAL DECOMPRESSION/DISCECTOMY FUSION 2 LEVELS;  Surgeon: Floyce Stakes, MD;  Location: [facility];  Service: Neurosurgery;  Laterality: N/A;  Cervical four-five, five - six  Anterior cervical decompression/diskectomy/fusion/plate  . APPENDECTOMY  1986  . BOWEL RESECTION N/A 07/29/2013   Procedure: serosal repair;  Surgeon: Adin Hector, MD;  Location: [facility];  Service: General;  Laterality: N/A;  . CARPAL TUNNEL RELEASE Bilateral   . LAPAROSCOPY N/A 07/29/2013  Procedure: diagnostic laporoscopy;  Surgeon: Adin Hector, MD;  Location: [facility];  Service: General;  Laterality: N/A;  . LAPAROSCOPY N/A 08/16/2013   Procedure: LAPAROSCOPY DIAGNOSTIC/LYSIS OF ADHESIONS;  Surgeon: Adin Hector, MD;  Location: [facility];  Service: General;  Laterality: N/A;  . LAPAROTOMY N/A 08/16/2013   Procedure: EXPLORATORY LAPAROTOMY/SMALL BOWEL RESECTION ([facility]);  Surgeon: Adin Hector, MD;  Location: [facility];  Service: General;  Laterality: N/A;  . LUMBAR SPINE SURGERY  2010   x 3  . LYSIS OF ADHESION  2003   Dr. Irving Shows  . LYSIS OF ADHESION N/A 07/29/2013   Procedure: LYSIS OF ADHESION;  Surgeon: Adin Hector, MD;  Location: [facility];   Service: General;  Laterality: N/A;  . OOPHORECTOMY    . PARTIAL HYSTERECTOMY  1990s?   [place], [place]  . SPINAL CORD STIMULATOR IMPLANT    . TRIGGER FINGER RELEASE  2009   right pinkie finger  . TUBAL LIGATION  1994    Family Psychiatric History: See below  Family History:  Family History  Problem Relation Age of Onset  . Lung cancer Father   . Stomach cancer Father   . Esophageal cancer Father   . Alcohol abuse Father   . Mental illness Father   . Diabetes Sister   . Hypertension Sister   . Bipolar disorder Sister   . Schizophrenia Sister   . Diabetes Sister   . Alcohol abuse Brother   . Hypertension Brother   . Kidney disease Brother   . Diabetes Brother   . Drug abuse Brother   . Mental illness Brother   . Alcohol abuse Brother   . Alcohol abuse Brother   . Hypertension Brother   . Diabetes Brother   . Alcohol abuse Brother   . Physical abuse Mother   . Alcohol abuse Mother   . Cirrhosis Mother   . Mental illness Brother        in [place]  . Drug abuse Sister   . HIV Sister   . Alcohol abuse Brother   . Pneumonia Sister        died as a baby  . Alcohol abuse Brother   . Bipolar disorder Brother   . Bipolar disorder Daughter   . Bipolar disorder Son   . Bipolar disorder Son   . Hypertension Brother   . Bipolar disorder Brother   . Drug abuse Brother   . Alcohol abuse Brother   . Bipolar disorder Brother   . ADD / ADHD Neg Hx   . Anxiety disorder Neg Hx   . Dementia Neg Hx   . Depression Neg Hx   . OCD Neg Hx   . Seizures Neg Hx   . Paranoid behavior Neg Hx   . Colon cancer Neg Hx     Social History:  Social History   Socioeconomic History  . Marital status: Married    Spouse name: Not on file  . Number of children: Not on file  . Years of education: Not on file  . Highest education level: Not on file  Occupational History  . Not on file  Social Needs  . Financial resource strain: Not on file  . Food insecurity:    Worry: Not on file     Inability: Not on file  . Transportation needs:    Medical: Not on file    Non-medical: Not on file  Tobacco Use  . Smoking status: Never Smoker  . Smokeless  tobacco: Never Used  Substance and Sexual Activity  . Alcohol use: No  . Drug use: No  . Sexual activity: Yes    Birth control/protection: Surgical    Comment: tubal  Lifestyle  . Physical activity:    Days per week: Not on file    Minutes per session: Not on file  . Stress: Not on file  Relationships  . Social connections:    Talks on phone: Not on file    Gets together: Not on file    Attends religious service: Not on file    Active member of club or organization: Not on file    Attends meetings of clubs or organizations: Not on file    Relationship status: Not on file  Other Topics Concern  . Not on file  Social History Narrative  . Not on file    Allergies:  Allergies  Allergen Reactions  . Amoxicillin Hives, Shortness Of Breath and Other (See Comments)    Has patient had a PCN reaction causing immediate rash, facial/tongue/throat swelling, SOB or lightheadedness with hypotension: Yes Has patient had a PCN reaction causing severe rash involving mucus membranes or skin necrosis: No Has patient had a PCN reaction that required hospitalization No Has patient had a PCN reaction occurring within the last 10 years: Yes If all of the above answers are "NO", then may proceed with Cephalosporin use.  Marland Kitchen Neurontin [Gabapentin] Nausea And Vomiting and Other (See Comments)    Reaction:  Sleep walking/hallucinations   . Penicillins Hives, Shortness Of Breath and Other (See Comments)    Has patient had a PCN reaction causing immediate rash, facial/tongue/throat swelling, SOB or lightheadedness with hypotension: Yes Has patient had a PCN reaction causing severe rash involving mucus membranes or skin necrosis: No Has patient had a PCN reaction that required hospitalization No Has patient had a PCN reaction occurring within the  last 10 years: Yes If all of the above answers are "NO", then may proceed with Cephalosporin use.  . Pregabalin Anaphylaxis, Shortness Of Breath, Diarrhea and Swelling    Lyrica  . Tramadol Other (See Comments)    Reaction:  Makes pt delusional   . Latex Rash    Metabolic Disorder Labs: Lab Results  Component Value Date   HGBA1C 6.0 (H) 12/08/2017   MPG 126 12/08/2017   MPG 120 06/09/2017   No results found for: PROLACTIN Lab Results  Component Value Date   CHOL 152 06/09/2017   TRIG 132 06/09/2017   HDL 53 06/09/2017   CHOLHDL 2.9 06/09/2017   VLDL 26 06/09/2017   LDLCALC 73 06/09/2017   LDLCALC 105 07/31/2015   Lab Results  Component Value Date   TSH 0.73 12/08/2017   TSH 0.85 11/11/2016    Therapeutic Level Labs: No results found for: LITHIUM No results found for: VALPROATE No components found for:  CBMZ  Current Medications: Current Outpatient Medications  Medication Sig Dispense Refill  . albuterol (PROVENTIL) (2.5 MG/3ML) 0.083% nebulizer solution Take 3 mLs (2.5 mg total) by nebulization every 6 (six) hours as needed for wheezing or shortness of breath. 75 mL 3  . ALPRAZolam (XANAX) 1 MG tablet Take 1 tablet (1 mg total) by mouth daily as needed for anxiety. 30 tablet 2  . amLODipine (NORVASC) 10 MG tablet Take 10 mg by mouth daily.    Marland Kitchen azelastine (ASTELIN) 0.1 % nasal spray USE 2 SPRAYS IN EACH NOSTRIL TWICE A DAY 30 mL 5  . Beclomethasone Diprop HFA (QVAR REDIHALER)  40 MCG/ACT AERB Inhale 2 puffs into the lungs 2 (two) times daily. 10.6 g 5  . Botulinum Toxin Type A 200 units SOLR Inject 200 Units as directed every 3 (three) months. 1 each 3  . cetirizine (ZYRTEC) 10 MG tablet Take 10 mg by mouth daily.    . cholecalciferol (VITAMIN D) 1000 units tablet Take 1,000 Units by mouth daily.    . ciclopirox (PENLAC) 8 % solution Apply topically at bedtime. Apply over nail and surrounding skin. Apply daily over previous coat. After seven (7) days, may remove with  alcohol and continue cycle. 6.6 mL 0  . ciprofloxacin (CIPRO) 500 MG tablet Take 1 tablet (500 mg total) by mouth 2 (two) times daily. 10 tablet 0  . cloNIDine (CATAPRES) 0.1 MG tablet TAKE 1 TABLET BY MOUTH EVERYDAY AT BEDTIME 90 tablet 1  . clotrimazole-betamethasone (LOTRISONE) cream Apply 1 application topically 2 (two) times daily as needed (for rash). 30 g 3  . clotrimazole-betamethasone (LOTRISONE) cream Apply topically 2 (two) times daily. 45 g 5  . cyclobenzaprine (FLEXERIL) 10 MG tablet One tablet at bedtime as needed, for spasm 30 tablet 1  . diclofenac sodium (VOLTAREN) 1 % GEL Apply 4 g topically 4 (four) times daily as needed (for pain).   5  . ergocalciferol (VITAMIN D2) 50000 units capsule Take 1 capsule (50,000 Units total) by mouth once a week. One capsule once weekly 12 capsule 1  . fluticasone (FLONASE) 50 MCG/ACT nasal spray Place 2 sprays into both nostrils daily as needed for rhinitis.    Marland Kitchen ipratropium (ATROVENT) 0.02 % nebulizer solution Take 0.5 mg by nebulization every 6 (six) hours as needed for wheezing or shortness of breath.    . linaclotide (LINZESS) 290 MCG CAPS capsule Take 1 capsule (290 mcg total) by mouth daily before breakfast. 30 capsule 11  . loratadine (CLARITIN) 10 MG tablet Take 1 tablet (10 mg total) by mouth daily. 90 tablet 1  . lubiprostone (AMITIZA) 24 MCG capsule Take 1 capsule (24 mcg total) by mouth 2 (two) times daily with a meal. 60 capsule 11  . metroNIDAZOLE (FLAGYL) 500 MG tablet Take 1 tablet (500 mg total) by mouth 2 (two) times daily. 14 tablet 0  . montelukast (SINGULAIR) 10 MG tablet TAKE 1 TABLET BY MOUTH AT BEDTIME 90 tablet 1  . Naproxen Sodium (ALEVE PO) Take by mouth as needed.    Marland Kitchen olopatadine (PATANOL) 0.1 % ophthalmic solution Place 1 drop into both eyes 2 (two) times daily.    . ondansetron (ZOFRAN-ODT) 4 MG disintegrating tablet Take 1 tablet by mouth every 12 (twelve) hours as needed for nausea/vomiting.  5  . Oxycodone HCl 10  MG TABS Take 10 mg by mouth every 12 (twelve) hours as needed (for pain).   0  . pantoprazole (PROTONIX) 40 MG tablet TAKE 1 TABLET BY MOUTH EVERY DAY 90 tablet 2  . phentermine (ADIPEX-P) 37.5 MG tablet Take 1 tablet (37.5 mg total) by mouth daily before breakfast. 30 tablet 0  . phentermine 37.5 MG capsule Take 1 capsule (37.5 mg total) by mouth every morning. 30 capsule 0  . polyethylene glycol powder (GLYCOLAX/MIRALAX) powder Take 17 grams (1 capful) dissolved in at least 8 ounces water juice and drink 3 times daily 1530 g 11  . potassium chloride (K-DUR,KLOR-CON) 10 MEQ tablet Take by mouth.    . potassium chloride 20 MEQ/15ML (10%) SOLN Take 20 mEq by mouth 2 (two) times daily.    . promethazine-dextromethorphan (PROMETHAZINE-DM)  6.25-15 MG/5ML syrup Take 5 mLs by mouth 4 (four) times daily as needed for cough. 118 mL 0  . rizatriptan (MAXALT-MLT) 10 MG disintegrating tablet Take 10 mg by mouth as needed for migraine. May repeat in 2 hours if needed    . spironolactone (ALDACTONE) 25 MG tablet TAKE 1 TABLET BY MOUTH EVERY DAY 90 tablet 1  . SUMAtriptan (IMITREX) 6 MG/0.5ML SOLN injection Inject 0.5 mLs (6 mg total) into the skin every 2 (two) hours as needed for migraine or headache. May repeat in 2 hours if headache persists or recurs. 12 vial 11  . terbinafine (LAMISIL) 250 MG tablet Take 1 tablet (250 mg total) by mouth daily. 42 tablet 1  . topiramate (TOPAMAX) 50 MG tablet Take 2 tablets (100 mg total) by mouth 2 (two) times daily. 360 tablet 1  . triamterene-hydrochlorothiazide (MAXZIDE-25) 37.5-25 MG tablet 1 tablet daily 90 tablet 1  . QUEtiapine (SEROQUEL) 25 MG tablet Take 1 tablet (25 mg total) by mouth at bedtime. 30 tablet 2   No current facility-administered medications for this visit.      Musculoskeletal: Strength & Muscle Tone: within normal limits Gait & Station: normal, unsteady Patient leans: N/A  Psychiatric Specialty Exam: ROS  Blood pressure 130/83, pulse 83,  height 5\' 6"  (1.676 m), weight 164 lb (74.4 kg), SpO2 97 %.Body mass index is 26.47 kg/m.  General Appearance: Casual and Fairly Groomed  Eye Contact:  Poor  Speech:  Clear and Coherent  Volume:  Increased  Mood:  Angry and Irritable  Affect:  Constricted and Flat  Thought Process:  Goal Directed  Orientation:  Full (Time, Place, and Person)  Thought Content: Rumination   Suicidal Thoughts:  No  Homicidal Thoughts:  No  Memory:  Immediate;   Good Recent;   Good Remote;   NA  Judgement:  Poor  Insight:  Lacking  Psychomotor Activity:  Normal  Concentration:  Concentration: Fair and Attention Span: Fair  Recall:  Good  Fund of Knowledge: Good  Language: Good  Akathisia:  No  Handed:  Right  AIMS (if indicated): not done  Assets:  Communication Skills Desire for Improvement Resilience Social Support Talents/Skills  ADL's:  Intact  Cognition: WNL  Sleep:  Fair   Screenings: PHQ2-9     Office Visit from 12/26/2017 in [facility] from 03/05/2017 in [facility] Office Visit from 10/29/2012 in [facility]  PHQ-2 Total Score  0  2  6  PHQ-9 Total Score  -  5  24       Assessment and Plan: A 47 year old female with a history of depression anxiety and "mood swings."  Most of her issues seem to be characterological as she has deteriorated in mood since she and her husband are having marital issues..  Nevertheless we will try Seroquel 25 mg at bedtime to help with mood stabilization and sleep and she will continue Xanax 1 mg daily for anxiety.  She has been rescheduled with her therapist Maurice Small and she will return to see me in 6 weeks   Levonne Spiller, MD 02/10/2018, 3:22 PM

## 2018-02-11 ENCOUNTER — Other Ambulatory Visit: Payer: Self-pay | Admitting: Family Medicine

## 2018-02-12 DIAGNOSIS — R69 Illness, unspecified: Secondary | ICD-10-CM | POA: Diagnosis not present

## 2018-02-13 ENCOUNTER — Other Ambulatory Visit: Payer: Self-pay

## 2018-02-13 MED ORDER — SPIRONOLACTONE 25 MG PO TABS: 25.0000 mg | ORAL_TABLET | Freq: Every day | ORAL | 1 refills | Status: DC

## 2018-02-16 ENCOUNTER — Encounter (HOSPITAL_COMMUNITY): Payer: Self-pay | Admitting: Psychiatry

## 2018-02-16 ENCOUNTER — Ambulatory Visit (HOSPITAL_COMMUNITY): Payer: Medicare HMO | Admitting: Psychiatry

## 2018-02-16 DIAGNOSIS — F3175 Bipolar disorder, in partial remission, most recent episode depressed: Secondary | ICD-10-CM | POA: Diagnosis not present

## 2018-02-16 DIAGNOSIS — R69 Illness, unspecified: Secondary | ICD-10-CM | POA: Diagnosis not present
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
## 2018-02-19 ENCOUNTER — Encounter: Payer: Self-pay | Admitting: Family Medicine

## 2018-02-24 DIAGNOSIS — R69 Illness, unspecified: Secondary | ICD-10-CM | POA: Diagnosis not present

## 2018-02-25 ENCOUNTER — Encounter: Payer: Self-pay | Admitting: Family Medicine

## 2018-02-27 ENCOUNTER — Encounter: Payer: Self-pay | Admitting: *Deleted

## 2018-03-04 ENCOUNTER — Other Ambulatory Visit (HOSPITAL_COMMUNITY): Payer: Self-pay | Admitting: Psychiatry

## 2018-03-10 ENCOUNTER — Other Ambulatory Visit: Payer: Self-pay | Admitting: Family Medicine

## 2018-03-17 DIAGNOSIS — G8929 Other chronic pain: Secondary | ICD-10-CM | POA: Diagnosis not present

## 2018-03-17 DIAGNOSIS — M1711 Unilateral primary osteoarthritis, right knee: Secondary | ICD-10-CM | POA: Diagnosis not present

## 2018-03-17 DIAGNOSIS — M961 Postlaminectomy syndrome, not elsewhere classified: Secondary | ICD-10-CM | POA: Diagnosis not present

## 2018-03-17 DIAGNOSIS — M545 Low back pain: Secondary | ICD-10-CM | POA: Diagnosis not present

## 2018-03-17 DIAGNOSIS — G894 Chronic pain syndrome: Secondary | ICD-10-CM | POA: Diagnosis not present

## 2018-03-24 ENCOUNTER — Ambulatory Visit (HOSPITAL_COMMUNITY): Payer: Medicare HMO | Admitting: Psychiatry

## 2018-03-24 ENCOUNTER — Encounter (HOSPITAL_COMMUNITY): Payer: Self-pay | Admitting: Psychiatry

## 2018-03-24 VITALS — BP 109/71 | HR 87 | Ht 66.0 in | Wt 164.0 lb

## 2018-03-24 DIAGNOSIS — Z813 Family history of other psychoactive substance abuse and dependence: Secondary | ICD-10-CM

## 2018-03-24 DIAGNOSIS — F419 Anxiety disorder, unspecified: Secondary | ICD-10-CM | POA: Diagnosis not present

## 2018-03-24 DIAGNOSIS — I10 Essential (primary) hypertension: Secondary | ICD-10-CM | POA: Diagnosis not present

## 2018-03-24 DIAGNOSIS — R110 Nausea: Secondary | ICD-10-CM | POA: Diagnosis not present

## 2018-03-24 DIAGNOSIS — F410 Panic disorder [episodic paroxysmal anxiety] without agoraphobia: Secondary | ICD-10-CM | POA: Diagnosis not present

## 2018-03-24 DIAGNOSIS — J45909 Unspecified asthma, uncomplicated: Secondary | ICD-10-CM | POA: Diagnosis not present

## 2018-03-24 DIAGNOSIS — Z811 Family history of alcohol abuse and dependence: Secondary | ICD-10-CM

## 2018-03-24 DIAGNOSIS — G8929 Other chronic pain: Secondary | ICD-10-CM | POA: Diagnosis not present

## 2018-03-24 DIAGNOSIS — R69 Illness, unspecified: Secondary | ICD-10-CM | POA: Diagnosis not present

## 2018-03-24 DIAGNOSIS — Z79899 Other long term (current) drug therapy: Secondary | ICD-10-CM

## 2018-03-24 DIAGNOSIS — Z818 Family history of other mental and behavioral disorders: Secondary | ICD-10-CM | POA: Diagnosis not present

## 2018-03-24 DIAGNOSIS — F3175 Bipolar disorder, in partial remission, most recent episode depressed: Secondary | ICD-10-CM | POA: Diagnosis not present

## 2018-03-24 DIAGNOSIS — K5900 Constipation, unspecified: Secondary | ICD-10-CM | POA: Diagnosis not present

## 2018-03-24 DIAGNOSIS — K219 Gastro-esophageal reflux disease without esophagitis: Secondary | ICD-10-CM | POA: Diagnosis not present

## 2018-03-24 DIAGNOSIS — G43909 Migraine, unspecified, not intractable, without status migrainosus: Secondary | ICD-10-CM | POA: Diagnosis not present

## 2018-03-24 DIAGNOSIS — L309 Dermatitis, unspecified: Secondary | ICD-10-CM | POA: Diagnosis not present

## 2018-03-24 MED ORDER — TEMAZEPAM 30 MG PO CAPS: 30.0000 mg | ORAL_CAPSULE | Freq: Every evening | ORAL | 0 refills | Status: DC | PRN

## 2018-03-24 MED ORDER — ALPRAZOLAM 1 MG PO TABS: 1.0000 mg | ORAL_TABLET | Freq: Every day | ORAL | 2 refills | Status: DC | PRN

## 2018-03-24 NOTE — Progress Notes (Signed)
BH MD/PA/NP OP Progress Note  03/24/2018 10:53 AM Jane English  MRN:  [id number]  Chief Complaint:  Chief Complaint    Anxiety; Depression; Follow-up; Manic Behavior     HPI: This patient is a 47 year old black female who lives with her husband in [place].  They have been separated intermittently but he is currently back in the home.  She is on disability for degenerative disc disease and chronic pain.  She has 7 children and 4 grandchildren.  The patient returns for follow-up of treatment of depression anxiety and mood swings.   The patient returns after 6 weeks.  Last time she states she was angry and irritable.  We have tried various antidepressants but she cannot tolerate any of them.  I did give her a low-dose of Seroquel-25 mg.  She states she takes it every night but she has vivid scary dreams and feels drowsy throughout the whole day.  She is not sleeping well.  She states that now she is more anxious through the day and having panic attacks.  I suggested we try something just to help her get her sleep cycle back.  We will send in Restoril and see if this helps.  She still uses Xanax 1 mg daily as needed Visit Diagnosis:    ICD-10-CM   1. Bipolar disorder, in partial remission, most recent episode depressed ([place]) F31.75     Past Psychiatric History: Long-term outpatient treatment  Past Medical History:  Past Medical History:  Diagnosis Date  . Anemia   . Asthma   . Asthma flare 04/09/2013  . Back pain   . Bronchitis   . Chronic abdominal pain   . Chronic constipation   . Constipation due to opioid therapy   . Depression   . Diabetes mellitus without complication ([place])   . Diabetes mellitus, type II ([place])   . DVT (deep venous thrombosis) ([place]) 2010  . GERD (gastroesophageal reflux disease)   . Heart murmur    no cardiologist  . Helicobacter pylori gastritis 06/11/2013   Colonoscopy Dr. Hilarie Fredrickson  . Hypertension   . IBS (irritable bowel syndrome)   . Migraine headache    . Neuropathy   . Obesity   . Obsessive-compulsive disorder   . PSYCHOTIC D/O W/HALLUCINATIONS CONDS CLASS ELSW 03/04/2010   Qualifier: Diagnosis of  By: Moshe Cipro MD, Joycelyn Schmid    . PTSD (post-traumatic stress disorder)   . SBO (small bowel obstruction) ([place]) 08/09/2013  . Seasonal allergies 12/10/2012  . Seizures ([place])   . Shortness of breath     Past Surgical History:  Procedure Laterality Date  . ANTERIOR CERVICAL DECOMP/DISCECTOMY FUSION  07/07/2012   Procedure: ANTERIOR CERVICAL DECOMPRESSION/DISCECTOMY FUSION 2 LEVELS;  Surgeon: Floyce Stakes, MD;  Location: [facility];  Service: Neurosurgery;  Laterality: N/A;  Cervical four-five, five - six  Anterior cervical decompression/diskectomy/fusion/plate  . APPENDECTOMY  1986  . BOWEL RESECTION N/A 07/29/2013   Procedure: serosal repair;  Surgeon: Adin Hector, MD;  Location: [facility];  Service: General;  Laterality: N/A;  . CARPAL TUNNEL RELEASE Bilateral   . LAPAROSCOPY N/A 07/29/2013   Procedure: diagnostic laporoscopy;  Surgeon: Adin Hector, MD;  Location: [facility];  Service: General;  Laterality: N/A;  . LAPAROSCOPY N/A 08/16/2013   Procedure: LAPAROSCOPY DIAGNOSTIC/LYSIS OF ADHESIONS;  Surgeon: Adin Hector, MD;  Location: [facility];  Service: General;  Laterality: N/A;  . LAPAROTOMY N/A 08/16/2013   Procedure: EXPLORATORY LAPAROTOMY/SMALL BOWEL RESECTION ([facility]);  Surgeon: Revonda Standard.  Gross, MD;  Location: [facility];  Service: General;  Laterality: N/A;  . LUMBAR SPINE SURGERY  2010   x 3  . LYSIS OF ADHESION  2003   Dr. Irving Shows  . LYSIS OF ADHESION N/A 07/29/2013   Procedure: LYSIS OF ADHESION;  Surgeon: Adin Hector, MD;  Location: [facility];  Service: General;  Laterality: N/A;  . OOPHORECTOMY    . PARTIAL HYSTERECTOMY  1990s?   [place],   . SPINAL CORD STIMULATOR IMPLANT    . TRIGGER FINGER RELEASE  2009   right pinkie finger  . TUBAL LIGATION  1994    Family Psychiatric History: See below  Family History:   Family History  Problem Relation Age of Onset  . Lung cancer Father   . Stomach cancer Father   . Esophageal cancer Father   . Alcohol abuse Father   . Mental illness Father   . Diabetes Sister   . Hypertension Sister   . Bipolar disorder Sister   . Schizophrenia Sister   . Diabetes Sister   . Alcohol abuse Brother   . Hypertension Brother   . Kidney disease Brother   . Diabetes Brother   . Drug abuse Brother   . Mental illness Brother   . Alcohol abuse Brother   . Alcohol abuse Brother   . Hypertension Brother   . Diabetes Brother   . Alcohol abuse Brother   . Physical abuse Mother   . Alcohol abuse Mother   . Cirrhosis Mother   . Mental illness Brother        in [place]  . Drug abuse Sister   . HIV Sister   . Alcohol abuse Brother   . Pneumonia Sister        died as a baby  . Alcohol abuse Brother   . Bipolar disorder Brother   . Bipolar disorder Daughter   . Bipolar disorder Son   . Bipolar disorder Son   . Hypertension Brother   . Bipolar disorder Brother   . Drug abuse Brother   . Alcohol abuse Brother   . Bipolar disorder Brother   . ADD / ADHD Neg Hx   . Anxiety disorder Neg Hx   . Dementia Neg Hx   . Depression Neg Hx   . OCD Neg Hx   . Seizures Neg Hx   . Paranoid behavior Neg Hx   . Colon cancer Neg Hx     Social History:  Social History   Socioeconomic History  . Marital status: Married    Spouse name: Not on file  . Number of children: Not on file  . Years of education: Not on file  . Highest education level: Not on file  Occupational History  . Not on file  Social Needs  . Financial resource strain: Not on file  . Food insecurity:    Worry: Not on file    Inability: Not on file  . Transportation needs:    Medical: Not on file    Non-medical: Not on file  Tobacco Use  . Smoking status: Never Smoker  . Smokeless tobacco: Never Used  Substance and Sexual Activity  . Alcohol use: No  . Drug use: No  . Sexual activity: Yes    Birth  control/protection: Surgical    Comment: tubal  Lifestyle  . Physical activity:    Days per week: Not on file    Minutes per session: Not on file  . Stress: Not on file  Relationships  . Social connections:    Talks on phone: Not on file    Gets together: Not on file    Attends religious service: Not on file    Active member of club or organization: Not on file    Attends meetings of clubs or organizations: Not on file    Relationship status: Not on file  Other Topics Concern  . Not on file  Social History Narrative  . Not on file    Allergies:  Allergies  Allergen Reactions  . Amoxicillin Hives, Shortness Of Breath and Other (See Comments)    Has patient had a PCN reaction causing immediate rash, facial/tongue/throat swelling, SOB or lightheadedness with hypotension: Yes Has patient had a PCN reaction causing severe rash involving mucus membranes or skin necrosis: No Has patient had a PCN reaction that required hospitalization No Has patient had a PCN reaction occurring within the last 10 years: Yes If all of the above answers are "NO", then may proceed with Cephalosporin use.  Marland Kitchen Neurontin [Gabapentin] Nausea And Vomiting and Other (See Comments)    Reaction:  Sleep walking/hallucinations   . Penicillins Hives, Shortness Of Breath and Other (See Comments)    Has patient had a PCN reaction causing immediate rash, facial/tongue/throat swelling, SOB or lightheadedness with hypotension: Yes Has patient had a PCN reaction causing severe rash involving mucus membranes or skin necrosis: No Has patient had a PCN reaction that required hospitalization No Has patient had a PCN reaction occurring within the last 10 years: Yes If all of the above answers are "NO", then may proceed with Cephalosporin use.  . Pregabalin Anaphylaxis, Shortness Of Breath, Diarrhea and Swelling    Lyrica  . Tramadol Other (See Comments)    Reaction:  Makes pt delusional   . Latex Rash    Metabolic  Disorder Labs: Lab Results  Component Value Date   HGBA1C 6.0 (H) 12/08/2017   MPG 126 12/08/2017   MPG 120 06/09/2017   No results found for: PROLACTIN Lab Results  Component Value Date   CHOL 152 06/09/2017   TRIG 132 06/09/2017   HDL 53 06/09/2017   CHOLHDL 2.9 06/09/2017   VLDL 26 06/09/2017   LDLCALC 73 06/09/2017   LDLCALC 105 07/31/2015   Lab Results  Component Value Date   TSH 0.73 12/08/2017   TSH 0.85 11/11/2016    Therapeutic Level Labs: No results found for: LITHIUM No results found for: VALPROATE No components found for:  CBMZ  Current Medications: Current Outpatient Medications  Medication Sig Dispense Refill  . albuterol (PROVENTIL) (2.5 MG/3ML) 0.083% nebulizer solution Take 3 mLs (2.5 mg total) by nebulization every 6 (six) hours as needed for wheezing or shortness of breath. 75 mL 3  . ALPRAZolam (XANAX) 1 MG tablet Take 1 tablet (1 mg total) by mouth daily as needed for anxiety. 30 tablet 2  . amLODipine (NORVASC) 10 MG tablet Take 10 mg by mouth daily.    Marland Kitchen azelastine (ASTELIN) 0.1 % nasal spray USE 2 SPRAYS IN EACH NOSTRIL TWICE A DAY 30 mL 5  . Beclomethasone Diprop HFA (QVAR REDIHALER) 40 MCG/ACT AERB Inhale 2 puffs into the lungs 2 (two) times daily. 10.6 g 5  . Botulinum Toxin Type A 200 units SOLR Inject 200 Units as directed every 3 (three) months. 1 each 3  . cetirizine (ZYRTEC) 10 MG tablet Take 10 mg by mouth daily.    . cholecalciferol (VITAMIN D) 1000 units tablet Take 1,000 Units by mouth  daily.    . ciclopirox (PENLAC) 8 % solution Apply topically at bedtime. Apply over nail and surrounding skin. Apply daily over previous coat. After seven (7) days, may remove with alcohol and continue cycle. 6.6 mL 0  . cloNIDine (CATAPRES) 0.1 MG tablet TAKE 1 TABLET BY MOUTH EVERYDAY AT BEDTIME 90 tablet 1  . clotrimazole-betamethasone (LOTRISONE) cream Apply 1 application topically 2 (two) times daily as needed (for rash). 30 g 3  .  clotrimazole-betamethasone (LOTRISONE) cream Apply topically 2 (two) times daily. 45 g 5  . cyclobenzaprine (FLEXERIL) 10 MG tablet One tablet at bedtime as needed, for spasm 30 tablet 1  . diclofenac sodium (VOLTAREN) 1 % GEL Apply 4 g topically 4 (four) times daily as needed (for pain).   5  . fluticasone (FLONASE) 50 MCG/ACT nasal spray Place 2 sprays into both nostrils daily as needed for rhinitis.    Marland Kitchen ipratropium (ATROVENT) 0.02 % nebulizer solution Take 0.5 mg by nebulization every 6 (six) hours as needed for wheezing or shortness of breath.    . linaclotide (LINZESS) 290 MCG CAPS capsule Take 1 capsule (290 mcg total) by mouth daily before breakfast. 30 capsule 11  . loratadine (CLARITIN) 10 MG tablet Take 1 tablet (10 mg total) by mouth daily. 90 tablet 1  . lubiprostone (AMITIZA) 24 MCG capsule Take 1 capsule (24 mcg total) by mouth 2 (two) times daily with a meal. 60 capsule 11  . montelukast (SINGULAIR) 10 MG tablet TAKE 1 TABLET BY MOUTH EVERYDAY AT BEDTIME 90 tablet 1  . Naproxen Sodium (ALEVE PO) Take by mouth as needed.    Marland Kitchen olopatadine (PATANOL) 0.1 % ophthalmic solution Place 1 drop into both eyes 2 (two) times daily.    . ondansetron (ZOFRAN-ODT) 4 MG disintegrating tablet Take 1 tablet by mouth every 12 (twelve) hours as needed for nausea/vomiting.  5  . Oxycodone HCl 10 MG TABS Take 10 mg by mouth every 12 (twelve) hours as needed (for pain).   0  . pantoprazole (PROTONIX) 40 MG tablet TAKE 1 TABLET BY MOUTH EVERY DAY 90 tablet 2  . polyethylene glycol powder (GLYCOLAX/MIRALAX) powder Take 17 grams (1 capful) dissolved in at least 8 ounces water juice and drink 3 times daily 1530 g 11  . potassium chloride (K-DUR,KLOR-CON) 10 MEQ tablet Take by mouth.    . potassium chloride 20 MEQ/15ML (10%) SOLN Take 20 mEq by mouth 2 (two) times daily.    . promethazine-dextromethorphan (PROMETHAZINE-DM) 6.25-15 MG/5ML syrup Take 5 mLs by mouth 4 (four) times daily as needed for cough. 118 mL  0  . rizatriptan (MAXALT-MLT) 10 MG disintegrating tablet Take 10 mg by mouth as needed for migraine. May repeat in 2 hours if needed    . spironolactone (ALDACTONE) 25 MG tablet Take 1 tablet (25 mg total) by mouth daily. 90 tablet 1  . SUMAtriptan (IMITREX) 6 MG/0.5ML SOLN injection Inject 0.5 mLs (6 mg total) into the skin every 2 (two) hours as needed for migraine or headache. May repeat in 2 hours if headache persists or recurs. 12 vial 11  . terbinafine (LAMISIL) 250 MG tablet Take 1 tablet (250 mg total) by mouth daily. 42 tablet 1  . topiramate (TOPAMAX) 50 MG tablet Take 2 tablets (100 mg total) by mouth 2 (two) times daily. 360 tablet 1  . triamterene-hydrochlorothiazide (MAXZIDE-25) 37.5-25 MG tablet 1 tablet daily 90 tablet 1  . Vitamin D, Ergocalciferol, (DRISDOL) 50000 units CAPS capsule TAKE 1 CAPSULE (50,000 UNITS TOTAL)  BY MOUTH ONCE A WEEK. 12 capsule 1  . temazepam (RESTORIL) 30 MG capsule Take 1 capsule (30 mg total) by mouth at bedtime as needed for sleep. 30 capsule 0   No current facility-administered medications for this visit.      Musculoskeletal: Strength & Muscle Tone: within normal limits Gait & Station: normal Patient leans: N/A  Psychiatric Specialty Exam: Review of Systems  Psychiatric/Behavioral: The patient is nervous/anxious and has insomnia.   All other systems reviewed and are negative.   Blood pressure 109/71, pulse 87, height 5\' 6"  (1.676 m), weight 164 lb (74.4 kg), SpO2 100 %.Body mass index is 26.47 kg/m.  General Appearance: Casual and Fairly Groomed  Eye Contact:  Good  Speech:  Clear and Coherent  Volume:  Normal  Mood:  Anxious and Irritable  Affect:  Constricted  Thought Process:  Goal Directed  Orientation:  Full (Time, Place, and Person)  Thought Content: Rumination   Suicidal Thoughts:  No  Homicidal Thoughts:  No  Memory:  Immediate;   Good Recent;   Good Remote;   Fair  Judgement:  Poor  Insight:  Lacking  Psychomotor  Activity:  Decreased  Concentration:  Concentration: Fair and Attention Span: Fair  Recall:  Good  Fund of Knowledge: Fair  Language: Good  Akathisia:  No  Handed:  Right  AIMS (if indicated): not done  Assets:  Communication Skills Desire for Improvement Resilience Social Support Talents/Skills  ADL's:  Intact  Cognition: WNL  Sleep:  Poor   Screenings: PHQ2-9     Office Visit from 12/26/2017 in [facility] from 03/05/2017 in [facility] Visit from 10/29/2012 in [facility]  PHQ-2 Total Score  0  2  6  PHQ-9 Total Score  -  5  24       Assessment and Plan: This patient is a 47 year old female with a history of depression irritable angry mood.  At times we have thought she is bipolar but it may be that her symptoms are more congruent with borderline personality disorder.  She does not like being on the Seroquel so we will discontinue it and start Restoril 30 mill grams at bedtime to see if we can get her sleep restored.  She will continue Xanax 1 mg daily as needed for anxiety.  She will return to see me in 4 weeks   Levonne Spiller, MD 03/24/2018, 10:53 AM

## 2018-03-31 ENCOUNTER — Encounter: Payer: Self-pay | Admitting: Internal Medicine

## 2018-03-31 ENCOUNTER — Ambulatory Visit: Payer: Medicare HMO | Admitting: Internal Medicine

## 2018-03-31 VITALS — BP 92/54 | HR 84 | Ht 65.5 in | Wt 161.4 lb

## 2018-03-31 DIAGNOSIS — K219 Gastro-esophageal reflux disease without esophagitis: Secondary | ICD-10-CM | POA: Diagnosis not present

## 2018-03-31 DIAGNOSIS — K660 Peritoneal adhesions (postprocedural) (postinfection): Secondary | ICD-10-CM | POA: Diagnosis not present

## 2018-03-31 DIAGNOSIS — K5909 Other constipation: Secondary | ICD-10-CM

## 2018-03-31 MED ORDER — LUBIPROSTONE 24 MCG PO CAPS: 24.0000 ug | ORAL_CAPSULE | Freq: Two times a day (BID) | ORAL | 11 refills | Status: DC

## 2018-03-31 MED ORDER — POLYETHYLENE GLYCOL 3350 17 GM/SCOOP PO POWD: ORAL | 11 refills | Status: DC

## 2018-03-31 MED ORDER — LINACLOTIDE 290 MCG PO CAPS: 290.0000 ug | ORAL_CAPSULE | Freq: Every day | ORAL | 11 refills | Status: DC

## 2018-03-31 NOTE — Patient Instructions (Signed)
Continue Linzess.  Continue Amitiza.  Continue Miralax.  Continue Pantoprazole.  If you are age 49 or older, your body mass index should be between 23-30. Your Body mass index is 26.45 kg/m. If this is out of the aforementioned range listed, please consider follow up with your Primary Care Provider.  If you are age 31 or younger, your body mass index should be between 19-25. Your Body mass index is 26.45 kg/m. If this is out of the aformentioned range listed, please consider follow up with your Primary Care Provider.

## 2018-03-31 NOTE — Progress Notes (Signed)
   Subjective:    Patient ID: Jane English, female    DOB: 12/24/1970, 47 y.o.   MRN: [id number]  HPI Jane English is a 47 year old female with a history of chronic constipation, abdominal adhesive disease, IBS, chronic pain, GERD, H. pylori status post treatment, bipolar depression, hypertension who is here for follow-up.  She was last seen on 04/21/2017 and presents alone today.  She reports that from a GI perspective she has been doing well.  She still has issues with constipation but this has been managed recently with Linzess 290 mcg daily, Amitiza 24 mcg twice daily and MiraLAX 1-3 times per day.  Most days she is having a bowel movement in the morning occasionally additional bowel movement in the evening.  Recently she has been under severe stress including financial stressors and has had a low appetite.  No nausea or vomiting.  No heartburn and continues pantoprazole with good result.  She has lost 16 pounds since her last visit with [place].   Review of Systems As per HPI, otherwise negative  Current Medications, Allergies, Past Medical History, Past Surgical History, Family History and Social History were reviewed in [employer] record.     Objective:   Physical Exam BP (!) 92/54   Pulse 84   Ht 5' 5.5" (1.664 m)   Wt 161 lb 6.4 oz (73.2 kg)   BMI 26.45 kg/m  Constitutional: Well-developed and well-nourished. No distress. HEENT: Normocephalic and atraumatic. Marland Kitchen Conjunctivae are normal.  No scleral icterus. Neck: Neck supple. Trachea midline. Cardiovascular: Normal rate, regular rhythm and intact distal pulses.  Pulmonary/chest: Effort normal and breath sounds normal. No wheezing, rales or rhonchi. Abdominal: Soft, nontender, nondistended. Bowel sounds active throughout.  Extremities: no clubbing, cyanosis, or edema Neurological: Alert and oriented to person place and time. Skin: Skin is warm and dry. Psychiatric: Normal mood and affect. Behavior is  normal.     Assessment & Plan:  47 year old female with a history of chronic constipation, abdominal adhesive disease, IBS, chronic pain, GERD, H. pylori status post treatment, bipolar depression, hypertension who is here for follow-up.  1.  Chronic constipation/IBS/abdominal adhesive disease --doing well with the use of 3 laxatives.  She is titrating MiraLAX 17 g 1-3 times daily as needed.  Continue MiraLAX 1-3 times daily, Linzess 290 mcg daily and Amitiza 24 mcg twice daily.  2.  GERD --well-controlled with pantoprazole, continue 40 mg daily  3.  CRC screening --repeat screening colonoscopy recommended 06/24/2023  Follow-up every 1 to 2 years, sooner if needed 15 minutes spent with the patient today. Greater than 50% was spent in counseling and coordination of care with the patient

## 2018-04-01 ENCOUNTER — Ambulatory Visit (INDEPENDENT_AMBULATORY_CARE_PROVIDER_SITE_OTHER): Payer: Medicare HMO | Admitting: Family Medicine

## 2018-04-01 ENCOUNTER — Encounter: Payer: Self-pay | Admitting: Family Medicine

## 2018-04-01 ENCOUNTER — Other Ambulatory Visit: Payer: Self-pay

## 2018-04-01 VITALS — BP 120/80 | HR 85 | Ht 66.0 in | Wt 160.1 lb

## 2018-04-01 DIAGNOSIS — I10 Essential (primary) hypertension: Secondary | ICD-10-CM

## 2018-04-01 DIAGNOSIS — F418 Other specified anxiety disorders: Secondary | ICD-10-CM | POA: Diagnosis not present

## 2018-04-01 DIAGNOSIS — R69 Illness, unspecified: Secondary | ICD-10-CM | POA: Diagnosis not present

## 2018-04-01 NOTE — Patient Instructions (Signed)
F/U as nfere on June 11.  WORK EXCUSE STARTING 5/30 AND  AND 04/03/2018  pLEASE TRY TO GET HELP FOR INCREASED ANXIETY AND STRESS , I WILL BE IN TOUCH WITH YOU ABOUT TELEPSYCHIATRY ACCESS

## 2018-04-02 ENCOUNTER — Telehealth (HOSPITAL_COMMUNITY): Payer: Self-pay | Admitting: *Deleted

## 2018-04-02 NOTE — Progress Notes (Signed)
   Jane English     MRN: [id number]      DOB: 10/28/1971   HPI Jane English is here reporting low blod pressure stating she feels weak. She has an office  Note from a Provider seen earlier today with a SBP under 100. Has discontinued her psychiatric medication. States she is "tired of taking medication" and denies suicidal or homicidal ideation, but does report feeling overwhelmed and stressed out. 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 Her spouse had left her for several months unexpectedly in December, returned in March and they are trying to work things out. Lot of financial burden currently, has not been able to afford therapy  ROS Denies recent fever or chills.c/o generalized fatigue, feel overwhemed  Denies sinus pressure, nasal congestion, ear pain or sore throat. Denies chest congestion, productive cough or wheezing. Denies chest pains, palpitations and leg swelling Denies abdominal pain, nausea, vomiting,diarrhea or constipation.   Denies dysuria, frequency, hesitancy or incontinence. C/o chronic  joint pain,  and limitation in mobility.  Denies skin break down or rash.   PE  BP 120/80   Pulse 85   Ht 5\' 6"  (1.676 m)   Wt 160 lb 1.9 oz (72.6 kg)   SpO2 98%   BMI 25.84 kg/m   Patient alert and oriented and in no cardiopulmonary distress.Flat affect  HEENT: No facial asymmetry, EOMI,   oropharynx pink and moist.  Neck supple no JVD, no mass.  Chest: Clear to auscultation bilaterally.  CVS: S1, S2 no murmurs, no S3.Regular rate.  ABD: Soft non tender.   Ext: No edema  MS: Adequate though reduced  ROM spine, shoulders, hips and knees.  Skin: Intact, no ulcerations or rash noted.  Psych: Poor eye contact, flat affect not  anxious very  depressed appearing.  CNS: CN 2-12 intact, .no focal deficits noted.   Assessment & Plan  Essential  hypertension Controlled, no change in medication   Depression with anxiety Managed by Behavioral health, though under financial stress should continue to d/u with behavioral health She is encouraged to follow treatment plan per psychiatry. Work excuse for the next 2 days based on mental health presentation

## 2018-04-02 NOTE — Telephone Encounter (Signed)
Patient was seen briefly with CMA and with the patient's friend in the room.  The patient states that she stopped taking the Xanax 1 mg and did not pick up the other medication for sleep.  She is not sleeping very well and last night was hearing voices apparently telling her to harm herself.  She has not acted on them and denies hearing them right now.  She denies any current suicidal ideation.  I suggested she restart the Xanax because most of her symptoms sound congruent with anxiety as she is alert and aware and does not seem delusional or psychotic.  I explained that she could take up to 1 mg 3 times a day for a few days..  She and her friend were agreeable with this suggestion and the friend agrees to watch the patient until the husband comes home from work later this evening.  If she becomes suicidal with a friend was instructed to either call 911 or bring her directly to the emergency room.  We will try to get the patient in sooner

## 2018-04-02 NOTE — Telephone Encounter (Signed)
Dr Ignacia Marvel Office Staff came in stating that patient was in the lobby & asked to see me. When I asked reason front office was unaware. After calling 2 patients back I had a few minutes I called patient into the clinical station. After seeing patient I realized that she was  acting differently. I asked what is going on? She states that she just stopped taking her Xanax cold [place]. And when I asked why  she stated she has no particular reason. Seroquel has been D/C'd Restoril script never was picked up. She started taking OTC  Melatonin to help sleep with no relief. She then stated that she is hearing voices that tells her to commit suicide. She adamantly  states she is not going to hurt herself. I asked did she want to harm anyone else. She stated no. I suggested she should go to   the ER. She became tearful stating she doesn't want to go to the hospital. At this point I came to get you.  ** Patient is requesting something that will help her sleep & she did take a Xanax after I gave her some cold water**

## 2018-04-03 ENCOUNTER — Other Ambulatory Visit: Payer: Self-pay | Admitting: Neurology

## 2018-04-03 ENCOUNTER — Ambulatory Visit (INDEPENDENT_AMBULATORY_CARE_PROVIDER_SITE_OTHER): Payer: Medicare HMO | Admitting: Psychiatry

## 2018-04-03 ENCOUNTER — Encounter (HOSPITAL_COMMUNITY): Payer: Self-pay | Admitting: Psychiatry

## 2018-04-03 VITALS — BP 106/73 | HR 95 | Ht 66.0 in | Wt 158.0 lb

## 2018-04-03 DIAGNOSIS — R69 Illness, unspecified: Secondary | ICD-10-CM | POA: Diagnosis not present

## 2018-04-03 DIAGNOSIS — F3175 Bipolar disorder, in partial remission, most recent episode depressed: Secondary | ICD-10-CM | POA: Diagnosis not present

## 2018-04-03 MED ORDER — TEMAZEPAM 30 MG PO CAPS: 30.0000 mg | ORAL_CAPSULE | Freq: Every evening | ORAL | 2 refills | Status: DC | PRN

## 2018-04-03 MED ORDER — ESCITALOPRAM OXALATE 20 MG PO TABS: 20.0000 mg | ORAL_TABLET | Freq: Every day | ORAL | 2 refills | Status: DC

## 2018-04-03 NOTE — Assessment & Plan Note (Addendum)
Managed by Behavioral health, though under financial stress should continue to d/u with behavioral health She is encouraged to follow treatment plan per psychiatry. Work excuse for the next 2 days based on mental health presentation

## 2018-04-03 NOTE — Progress Notes (Signed)
BH MD/PA/NP OP Progress Note  04/03/2018 11:25 AM Jane English  MRN:  [id number]  Chief Complaint:  Chief Complaint    Depression; Anxiety; Manic Behavior; Follow-up     WT[facility]:UEKC patient is a 47 year old black female who lives with her husband in [place]. They have been separated intermittently but he is currently back in the home. She is on disability for degenerative disc disease and chronic pain. She has 7 children and 4 grandchildren. The patient returns for follow-up of treatment of depression anxiety and mood swings.  Returns with a friend today.  She was last seen about a week ago.  She turned up in the clinic yesterday stating that she was hearing voices telling her to harm herself.  She had stopped the Xanax abruptly and had never picked up the Restoril.  However after much discussion yesterday she claims she was no longer hearing the voices and was not going to hurt herself and she went home with her friend.  She is back today and denies any auditory visual hallucinations or suicidal ideation.  She took one Xanax yesterday and took the Restoril 30 mg at bedtime and she slept fairly well.  She states that she is depressed and worried about financial issues and difficulties with her husband.  He left for several months and is back and they are trying to work out their differences.  I urged her to come to therapy despite the financial difficulties I told her we would try to work this out for her.  I also mentioned that she seemed to have done better when she was on Lexapro and we will restart this for her.  She was also warned not to take her oxycodone with any of the psychiatric medications Visit Diagnosis:    ICD-10-CM   1. Bipolar disorder, in partial remission, most recent episode depressed ([place]) F31.75     Past Psychiatric History: Long-term outpatient treatment  Past Medical History:  Past Medical History:  Diagnosis Date  . Anemia   . Asthma   . Asthma flare 04/09/2013  .  Back pain   . Bronchitis   . Chronic abdominal pain   . Chronic constipation   . Constipation due to opioid therapy   . Depression   . Diabetes mellitus without complication ([place])   . Diabetes mellitus, type II ([place])   . DVT (deep venous thrombosis) ([place]) 2010  . GERD (gastroesophageal reflux disease)   . Heart murmur    no cardiologist  . Helicobacter pylori gastritis 06/11/2013   Colonoscopy Dr. Hilarie Fredrickson  . Hypertension   . IBS (irritable bowel syndrome)   . Migraine headache   . Neuropathy   . Obesity   . Obsessive-compulsive disorder   . PSYCHOTIC D/O W/HALLUCINATIONS CONDS CLASS ELSW 03/04/2010   Qualifier: Diagnosis of  By: Moshe Cipro MD, Joycelyn Schmid    . PTSD (post-traumatic stress disorder)   . SBO (small bowel obstruction) ([place]) 08/09/2013  . Seasonal allergies 12/10/2012  . Seizures ([place])   . Shortness of breath     Past Surgical History:  Procedure Laterality Date  . ANTERIOR CERVICAL DECOMP/DISCECTOMY FUSION  07/07/2012   Procedure: ANTERIOR CERVICAL DECOMPRESSION/DISCECTOMY FUSION 2 LEVELS;  Surgeon: Floyce Stakes, MD;  Location: [facility];  Service: Neurosurgery;  Laterality: N/A;  Cervical four-five, five - six  Anterior cervical decompression/diskectomy/fusion/plate  . APPENDECTOMY  1986  . BOWEL RESECTION N/A 07/29/2013   Procedure: serosal repair;  Surgeon: Adin Hector, MD;  Location: [facility];  Service: General;  Laterality: N/A;  . CARPAL TUNNEL RELEASE Bilateral   . LAPAROSCOPY N/A 07/29/2013   Procedure: diagnostic laporoscopy;  Surgeon: Adin Hector, MD;  Location: [facility];  Service: General;  Laterality: N/A;  . LAPAROSCOPY N/A 08/16/2013   Procedure: LAPAROSCOPY DIAGNOSTIC/LYSIS OF ADHESIONS;  Surgeon: Adin Hector, MD;  Location: [facility];  Service: General;  Laterality: N/A;  . LAPAROTOMY N/A 08/16/2013   Procedure: EXPLORATORY LAPAROTOMY/SMALL BOWEL RESECTION ([facility]);  Surgeon: Adin Hector, MD;  Location: [facility];  Service: General;  Laterality: N/A;   . LUMBAR SPINE SURGERY  2010   x 3  . LYSIS OF ADHESION  2003   Dr. Irving Shows  . LYSIS OF ADHESION N/A 07/29/2013   Procedure: LYSIS OF ADHESION;  Surgeon: Adin Hector, MD;  Location: [facility];  Service: General;  Laterality: N/A;  . OOPHORECTOMY    . PARTIAL HYSTERECTOMY  1990s?   [place], [place]  . SPINAL CORD STIMULATOR IMPLANT    . TRIGGER FINGER RELEASE  2009   right pinkie finger  . TUBAL LIGATION  1994    Family Psychiatric History: See below  Family History:  Family History  Problem Relation Age of Onset  . Lung cancer Father   . Stomach cancer Father   . Esophageal cancer Father   . Alcohol abuse Father   . Mental illness Father   . Diabetes Sister   . Hypertension Sister   . Bipolar disorder Sister   . Schizophrenia Sister   . Diabetes Sister   . Alcohol abuse Brother   . Hypertension Brother   . Kidney disease Brother   . Diabetes Brother   . Drug abuse Brother   . Mental illness Brother   . Alcohol abuse Brother   . Alcohol abuse Brother   . Hypertension Brother   . Diabetes Brother   . Alcohol abuse Brother   . Physical abuse Mother   . Alcohol abuse Mother   . Cirrhosis Mother   . Mental illness Brother        in [place]  . Drug abuse Sister   . HIV Sister   . Alcohol abuse Brother   . Pneumonia Sister        died as a baby  . Alcohol abuse Brother   . Bipolar disorder Brother   . Bipolar disorder Daughter   . Bipolar disorder Son   . Bipolar disorder Son   . Hypertension Brother   . Bipolar disorder Brother   . Drug abuse Brother   . Alcohol abuse Brother   . Bipolar disorder Brother   . ADD / ADHD Neg Hx   . Anxiety disorder Neg Hx   . Dementia Neg Hx   . Depression Neg Hx   . OCD Neg Hx   . Seizures Neg Hx   . Paranoid behavior Neg Hx   . Colon cancer Neg Hx     Social History:  Social History   Socioeconomic History  . Marital status: Married    Spouse name: Not on file  . Number of children: Not on file  . Years of  education: Not on file  . Highest education level: Not on file  Occupational History  . Not on file  Social Needs  . Financial resource strain: Not on file  . Food insecurity:    Worry: Not on file    Inability: Not on file  . Transportation needs:    Medical: Not on file  Non-medical: Not on file  Tobacco Use  . Smoking status: Never Smoker  . Smokeless tobacco: Never Used  Substance and Sexual Activity  . Alcohol use: No  . Drug use: No  . Sexual activity: Yes    Birth control/protection: Surgical    Comment: tubal  Lifestyle  . Physical activity:    Days per week: Not on file    Minutes per session: Not on file  . Stress: Not on file  Relationships  . Social connections:    Talks on phone: Not on file    Gets together: Not on file    Attends religious service: Not on file    Active member of club or organization: Not on file    Attends meetings of clubs or organizations: Not on file    Relationship status: Not on file  Other Topics Concern  . Not on file  Social History Narrative  . Not on file    Allergies:  Allergies  Allergen Reactions  . Amoxicillin Hives, Shortness Of Breath and Other (See Comments)    Has patient had a PCN reaction causing immediate rash, facial/tongue/throat swelling, SOB or lightheadedness with hypotension: Yes Has patient had a PCN reaction causing severe rash involving mucus membranes or skin necrosis: No Has patient had a PCN reaction that required hospitalization No Has patient had a PCN reaction occurring within the last 10 years: Yes If all of the above answers are "NO", then may proceed with Cephalosporin use.  Marland Kitchen Neurontin [Gabapentin] Nausea And Vomiting and Other (See Comments)    Reaction:  Sleep walking/hallucinations   . Penicillins Hives, Shortness Of Breath and Other (See Comments)    Has patient had a PCN reaction causing immediate rash, facial/tongue/throat swelling, SOB or lightheadedness with hypotension: Yes Has  patient had a PCN reaction causing severe rash involving mucus membranes or skin necrosis: No Has patient had a PCN reaction that required hospitalization No Has patient had a PCN reaction occurring within the last 10 years: Yes If all of the above answers are "NO", then may proceed with Cephalosporin use.  . Pregabalin Anaphylaxis, Shortness Of Breath, Diarrhea and Swelling    Lyrica  . Tramadol Other (See Comments)    Reaction:  Makes pt delusional   . Latex Rash    Metabolic Disorder Labs: Lab Results  Component Value Date   HGBA1C 6.0 (H) 12/08/2017   MPG 126 12/08/2017   MPG 120 06/09/2017   No results found for: PROLACTIN Lab Results  Component Value Date   CHOL 152 06/09/2017   TRIG 132 06/09/2017   HDL 53 06/09/2017   CHOLHDL 2.9 06/09/2017   VLDL 26 06/09/2017   LDLCALC 73 06/09/2017   LDLCALC 105 07/31/2015   Lab Results  Component Value Date   TSH 0.73 12/08/2017   TSH 0.85 11/11/2016    Therapeutic Level Labs: No results found for: LITHIUM No results found for: VALPROATE No components found for:  CBMZ  Current Medications: Current Outpatient Medications  Medication Sig Dispense Refill  . albuterol (PROVENTIL) (2.5 MG/3ML) 0.083% nebulizer solution Take 3 mLs (2.5 mg total) by nebulization every 6 (six) hours as needed for wheezing or shortness of breath. 75 mL 3  . ALPRAZolam (XANAX) 1 MG tablet Take 1 tablet (1 mg total) by mouth daily as needed for anxiety. 30 tablet 2  . amLODipine (NORVASC) 10 MG tablet Take 10 mg by mouth daily.    Marland Kitchen azelastine (ASTELIN) 0.1 % nasal spray USE 2 SPRAYS  IN EACH NOSTRIL TWICE A DAY 30 mL 5  . Beclomethasone Diprop HFA (QVAR REDIHALER) 40 MCG/ACT AERB Inhale 2 puffs into the lungs 2 (two) times daily. 10.6 g 5  . Botulinum Toxin Type A 200 units SOLR Inject 200 Units as directed every 3 (three) months. 1 each 3  . cetirizine (ZYRTEC) 10 MG tablet Take 10 mg by mouth daily.    . ciclopirox (PENLAC) 8 % solution Apply  topically at bedtime. Apply over nail and surrounding skin. Apply daily over previous coat. After seven (7) days, may remove with alcohol and continue cycle. 6.6 mL 0  . cloNIDine (CATAPRES) 0.1 MG tablet TAKE 1 TABLET BY MOUTH EVERYDAY AT BEDTIME 90 tablet 1  . clotrimazole-betamethasone (LOTRISONE) cream Apply topically 2 (two) times daily. 45 g 5  . diclofenac sodium (VOLTAREN) 1 % GEL Apply 4 g topically 4 (four) times daily as needed (for pain).   5  . escitalopram (LEXAPRO) 20 MG tablet Take 1 tablet (20 mg total) by mouth daily. 30 tablet 2  . fluticasone (FLONASE) 50 MCG/ACT nasal spray Place 2 sprays into both nostrils daily as needed for rhinitis.    Marland Kitchen ipratropium (ATROVENT) 0.02 % nebulizer solution Take 0.5 mg by nebulization every 6 (six) hours as needed for wheezing or shortness of breath.    . linaclotide (LINZESS) 290 MCG CAPS capsule Take 1 capsule (290 mcg total) by mouth daily before breakfast. 30 capsule 11  . loratadine (CLARITIN) 10 MG tablet Take 1 tablet (10 mg total) by mouth daily. 90 tablet 1  . lubiprostone (AMITIZA) 24 MCG capsule Take 1 capsule (24 mcg total) by mouth 2 (two) times daily with a meal. 60 capsule 11  . montelukast (SINGULAIR) 10 MG tablet TAKE 1 TABLET BY MOUTH EVERYDAY AT BEDTIME 90 tablet 1  . Naproxen Sodium (ALEVE PO) Take by mouth as needed.    Marland Kitchen NARCAN 4 MG/0.1ML LIQD nasal spray kit Place 4 mg into the nose as directed.  1  . olopatadine (PATANOL) 0.1 % ophthalmic solution Place 1 drop into both eyes 2 (two) times daily.    . ondansetron (ZOFRAN-ODT) 4 MG disintegrating tablet Take 1 tablet by mouth every 12 (twelve) hours as needed for nausea/vomiting.  5  . Oxycodone HCl 10 MG TABS Take 10 mg by mouth every 12 (twelve) hours as needed (for pain).   0  . pantoprazole (PROTONIX) 40 MG tablet TAKE 1 TABLET BY MOUTH EVERY DAY 90 tablet 2  . polyethylene glycol powder (GLYCOLAX/MIRALAX) powder Take 17 grams (1 capful) dissolved in at least 8 ounces  water juice and drink 3 times daily 1530 g 11  . potassium chloride 20 MEQ/15ML (10%) SOLN Take 20 mEq by mouth 2 (two) times daily.    . rizatriptan (MAXALT-MLT) 10 MG disintegrating tablet Take 10 mg by mouth as needed for migraine. May repeat in 2 hours if needed    . spironolactone (ALDACTONE) 25 MG tablet Take 1 tablet (25 mg total) by mouth daily. 90 tablet 1  . SUMAtriptan (IMITREX) 6 MG/0.5ML SOLN injection Inject 0.5 mLs (6 mg total) into the skin every 2 (two) hours as needed for migraine or headache. May repeat in 2 hours if headache persists or recurs. 12 vial 11  . temazepam (RESTORIL) 30 MG capsule Take 1 capsule (30 mg total) by mouth at bedtime as needed for sleep. 30 capsule 2  . terbinafine (LAMISIL) 250 MG tablet Take 1 tablet (250 mg total) by mouth daily. [place]  tablet 1  . topiramate (TOPAMAX) 50 MG tablet Take 2 tablets (100 mg total) by mouth 2 (two) times daily. 360 tablet 1  . triamterene-hydrochlorothiazide (MAXZIDE-25) 37.5-25 MG tablet 1 tablet daily 90 tablet 1  . Vitamin D, Ergocalciferol, (DRISDOL) 50000 units CAPS capsule TAKE 1 CAPSULE (50,000 UNITS TOTAL) BY MOUTH ONCE A WEEK. 12 capsule 1   No current facility-administered medications for this visit.      Musculoskeletal: Strength & Muscle Tone: within normal limits Gait & Station: normal Patient leans: N/A  Psychiatric Specialty Exam: Review of Systems  Psychiatric/Behavioral: Positive for depression. The patient is nervous/anxious and has insomnia.   All other systems reviewed and are negative.   Blood pressure 106/73, pulse 95, height '5\' 6"'  (1.676 m), weight 158 lb (71.7 kg), SpO2 99 %.Body mass index is 25.5 kg/m.  General Appearance: Casual and Fairly Groomed  Eye Contact:  Fair  Speech:  Clear and Coherent  Volume:  Decreased  Mood:  Anxious, Depressed and Irritable  Affect:  Constricted and Depressed  Thought Process:  Goal Directed  Orientation:  Full (Time, Place, and Person)  Thought  Content: Rumination   Suicidal Thoughts:  No  Homicidal Thoughts:  No  Memory:  Immediate;   Good Recent;   Good Remote;   Fair  Judgement:  Poor  Insight:  Lacking  Psychomotor Activity:  Decreased  Concentration:  Concentration: Poor and Attention Span: Poor  Recall:  Good  Fund of Knowledge: Fair  Language: Good  Akathisia:  No  Handed:  Right  AIMS (if indicated): not done  Assets:  Communication Skills Desire for Improvement Resilience Social Support Talents/Skills  ADL's:  Intact  Cognition: WNL  Sleep:  Poor   Screenings: PHQ2-9     Office Visit from 04/01/2018 in [facility] Office Visit from 12/26/2017 in [facility] from 03/05/2017 in [facility] Office Visit from 10/29/2012 in [facility]  PHQ-2 Total Score  6  0  2  6  PHQ-9 Total Score  20  -  5  24       Assessment and Plan: Patient is a 47 year old female with a history of probable borderline personality disorder mood swings irritability anger depression.  She denies any auditory visual hallucinations or suicidal ideation today.  She will continue Restoril 30 mg at bedtime to help with sleep, Xanax 1 mg daily for anxiety and will restart Lexapro 20 mg daily for depression.  We will try to get her in to see me again in about 2 weeks and will try to move up her therapy appointment as well.   Levonne Spiller, MD 04/03/2018, 11:25 AM

## 2018-04-03 NOTE — Assessment & Plan Note (Signed)
Controlled, no change in medication  

## 2018-04-06 ENCOUNTER — Encounter (HOSPITAL_COMMUNITY): Payer: Self-pay | Admitting: Psychiatry

## 2018-04-06 ENCOUNTER — Ambulatory Visit (INDEPENDENT_AMBULATORY_CARE_PROVIDER_SITE_OTHER): Payer: Medicare HMO | Admitting: Psychiatry

## 2018-04-06 DIAGNOSIS — F3175 Bipolar disorder, in partial remission, most recent episode depressed: Secondary | ICD-10-CM | POA: Diagnosis not present

## 2018-04-06 DIAGNOSIS — R69 Illness, unspecified: Secondary | ICD-10-CM | POA: Diagnosis not present
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
## 2018-04-13 ENCOUNTER — Ambulatory Visit (HOSPITAL_COMMUNITY): Payer: Self-pay | Admitting: Psychiatry

## 2018-04-14 ENCOUNTER — Other Ambulatory Visit (HOSPITAL_COMMUNITY)
Admission: RE | Admit: 2018-04-14 | Discharge: 2018-04-14 | Disposition: A | Discharge status: Home / Self Care | Payer: Medicare HMO | Source: Ambulatory Visit | Attending: Family Medicine | Admitting: Family Medicine

## 2018-04-14 ENCOUNTER — Ambulatory Visit (INDEPENDENT_AMBULATORY_CARE_PROVIDER_SITE_OTHER): Payer: Medicare HMO | Admitting: Family Medicine

## 2018-04-14 ENCOUNTER — Encounter: Payer: Self-pay | Admitting: Family Medicine

## 2018-04-14 ENCOUNTER — Telehealth (INDEPENDENT_AMBULATORY_CARE_PROVIDER_SITE_OTHER): Payer: Medicare HMO

## 2018-04-14 VITALS — BP 100/70 | HR 76 | Resp 16 | Ht 66.0 in | Wt 156.0 lb

## 2018-04-14 DIAGNOSIS — F418 Other specified anxiety disorders: Secondary | ICD-10-CM

## 2018-04-14 DIAGNOSIS — E559 Vitamin D deficiency, unspecified: Secondary | ICD-10-CM | POA: Diagnosis not present

## 2018-04-14 DIAGNOSIS — Z0000 Encounter for general adult medical examination without abnormal findings: Secondary | ICD-10-CM

## 2018-04-14 DIAGNOSIS — Z01419 Encounter for gynecological examination (general) (routine) without abnormal findings: Secondary | ICD-10-CM | POA: Diagnosis not present

## 2018-04-14 DIAGNOSIS — R87810 Cervical high risk human papillomavirus (HPV) DNA test positive: Secondary | ICD-10-CM | POA: Diagnosis not present

## 2018-04-14 DIAGNOSIS — N912 Amenorrhea, unspecified: Secondary | ICD-10-CM

## 2018-04-14 DIAGNOSIS — I10 Essential (primary) hypertension: Secondary | ICD-10-CM | POA: Diagnosis not present

## 2018-04-14 DIAGNOSIS — Z124 Encounter for screening for malignant neoplasm of cervix: Secondary | ICD-10-CM | POA: Insufficient documentation

## 2018-04-14 DIAGNOSIS — R69 Illness, unspecified: Secondary | ICD-10-CM | POA: Diagnosis not present

## 2018-04-14 DIAGNOSIS — R7303 Prediabetes: Secondary | ICD-10-CM

## 2018-04-14 DIAGNOSIS — G894 Chronic pain syndrome: Secondary | ICD-10-CM
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
## 2018-04-14 NOTE — Assessment & Plan Note (Addendum)
PHQ 9 score of 20 , not suiocidal or homicidal. Wants to change psychiatrist, has questions about medication to take,states that Remeron causes her to be excessively drowsy and that she does not want to take it , despite the fact that she has both depression and poor appetite.  She is being referred to new psychiatrist per her request as well as to tele psychiatric services which will begin on the day of her visit

## 2018-04-14 NOTE — BH Specialist Note (Signed)
[place] Initial Clinical Assessment  MRN: [id number] NAME: LATOSHIA MONRROY Date: 04/14/18   Total time: 35 minutes  Type of Contact: Type of Contact: Phone Call Initial Contact Patient consent obtained: Patient consent obtained for Virtual Visit: Yes Reason for Visit today: Reason for Your Call/Visit Today: Video Initial Assessment   Treatment History Patient recently received Inpatient Treatment: Have You Recently Been in Any Inpatient Treatment (Hospital/Detox/Crisis Center/28-Day Program)?: No(Lastn inpt psych hospitalization was in 2010)  Facility/Program:   Unable to remember the name of the facility  Date of discharge:  2010  Patient currently being seen by therapist/psychiatrist: Do You Currently Have a Therapist/Psychiatrist?: Yes - Maurice Small  Patient currently receiving the following services: Patient Currently Receiving the Following Services:: Medication Management(Dr. Harrington Challenger ) Yes   Past Psychiatric History/Hospitalization(s): Anxiety: Yes Bipolar Disorder: No Depression: Yes Mania: No Psychosis: No Schizophrenia: No Personality Disorder: No Hospitalization for psychiatric illness: Yes History of Electroconvulsive Shock Therapy: No Prior Suicide Attempts: Yes Decreased need for sleep: No  Euphoria: No Self Injurious behaviors No Family History of mental illness: No Family History of substance abuse: No  Substance Abuse: No  DUI: No  Insomnia: No  History of violence No  Physical, sexual or emotional abuse:No  Prior outpatient mental health therapy: Yes          Clinical Assessment:  PHQ-9 Assessments: Depression screen [facility] 2/9 04/14/2018 04/01/2018 12/26/2017  Decreased Interest 3 3 0  Down, Depressed, Hopeless 3 3 0  PHQ - 2 Score 6 6 0  Altered sleeping 2 3 -  Tired, decreased energy 3 3 -  Change in appetite 1 3 -  Feeling bad or failure about yourself  3 2 -  Trouble concentrating 1 0 -  Moving slowly or fidgety/restless 0 3 -   Suicidal thoughts 0 0 -  PHQ-9 Score 16 20 -  Difficult doing work/chores Very difficult (No Data) -  Some recent data might be hidden    GAD-7 Assessments: GAD 7 : Generalized Anxiety Score 04/14/2018  Nervous, Anxious, on Edge 2  Control/stop worrying 3  Worry too much - different things 2  Trouble relaxing 2  Restless 1  Easily annoyed or irritable 2  Afraid - awful might happen 3  Total GAD 7 Score 15  Anxiety Difficulty Very difficult     Social Functioning Social maturity: Social Maturity: Responsible Social judgement: Social Judgement: Normal  Stress Current stressors: Current Stressors: Family conflict, Job loss/unemployment, Publishing rights manager, Separation Familial stressors: Familial Stressors: None Sleep: Sleep: Difficulty falling asleep, Difficulty staying asleep Appetite: Appetite: Decreased Coping ability: Coping ability: Overwhelmed, Exhausted  Patient taking medications as prescribed: Patient taking medications as prescribed: Yes  Current medications:  Outpatient Encounter Medications as of 04/14/2018  Medication Sig  . albuterol (PROVENTIL) (2.5 MG/3ML) 0.083% nebulizer solution Take 3 mLs (2.5 mg total) by nebulization every 6 (six) hours as needed for wheezing or shortness of breath.  . ALPRAZolam (XANAX) 1 MG tablet Take 1 tablet (1 mg total) by mouth daily as needed for anxiety.  Marland Kitchen amLODipine (NORVASC) 10 MG tablet Take 10 mg by mouth daily.  Marland Kitchen azelastine (ASTELIN) 0.1 % nasal spray USE 2 SPRAYS IN EACH NOSTRIL TWICE A DAY  . Beclomethasone Diprop HFA (QVAR REDIHALER) 40 MCG/ACT AERB Inhale 2 puffs into the lungs 2 (two) times daily.  . cetirizine (ZYRTEC) 10 MG tablet Take 10 mg by mouth daily.  . ciclopirox (PENLAC) 8 % solution Apply topically at bedtime. Apply over  nail and surrounding skin. Apply daily over previous coat. After seven (7) days, may remove with alcohol and continue cycle.  . cloNIDine (CATAPRES) 0.1 MG tablet TAKE 1 TABLET BY MOUTH EVERYDAY AT  BEDTIME  . clotrimazole-betamethasone (LOTRISONE) cream Apply topically 2 (two) times daily.  Marland Kitchen escitalopram (LEXAPRO) 20 MG tablet Take 1 tablet (20 mg total) by mouth daily.  . fluticasone (FLONASE) 50 MCG/ACT nasal spray Place 2 sprays into both nostrils daily as needed for rhinitis.  Marland Kitchen ipratropium (ATROVENT) 0.02 % nebulizer solution Take 0.5 mg by nebulization every 6 (six) hours as needed for wheezing or shortness of breath.  . linaclotide (LINZESS) 290 MCG CAPS capsule Take 1 capsule (290 mcg total) by mouth daily before breakfast.  . loratadine (CLARITIN) 10 MG tablet Take 1 tablet (10 mg total) by mouth daily.  Marland Kitchen lubiprostone (AMITIZA) 24 MCG capsule Take 1 capsule (24 mcg total) by mouth 2 (two) times daily with a meal.  . montelukast (SINGULAIR) 10 MG tablet TAKE 1 TABLET BY MOUTH EVERYDAY AT BEDTIME  . NARCAN 4 MG/0.1ML LIQD nasal spray kit Place 4 mg into the nose as directed.  Marland Kitchen olopatadine (PATANOL) 0.1 % ophthalmic solution Place 1 drop into both eyes 2 (two) times daily.  . Oxycodone HCl 10 MG TABS Take 10 mg by mouth every 12 (twelve) hours as needed (for pain).   . pantoprazole (PROTONIX) 40 MG tablet TAKE 1 TABLET BY MOUTH EVERY DAY  . polyethylene glycol powder (GLYCOLAX/MIRALAX) powder Take 17 grams (1 capful) dissolved in at least 8 ounces water juice and drink 3 times daily  . potassium chloride 20 MEQ/15ML (10%) SOLN Take 20 mEq by mouth 2 (two) times daily.  . rizatriptan (MAXALT-MLT) 10 MG disintegrating tablet Take 10 mg by mouth as needed for migraine. May repeat in 2 hours if needed  . spironolactone (ALDACTONE) 25 MG tablet Take 1 tablet (25 mg total) by mouth daily.  . SUMAtriptan (IMITREX) 6 MG/0.5ML SOLN injection Inject 0.5 mLs (6 mg total) into the skin every 2 (two) hours as needed for migraine or headache. May repeat in 2 hours if headache persists or recurs.  . temazepam (RESTORIL) 30 MG capsule Take 1 capsule (30 mg total) by mouth at bedtime as needed for  sleep.  Marland Kitchen topiramate (TOPAMAX) 50 MG tablet Take 2 tablets (100 mg total) by mouth 2 (two) times daily.  Marland Kitchen triamterene-hydrochlorothiazide (MAXZIDE-25) 37.5-25 MG tablet 1 tablet daily  . Vitamin D, Ergocalciferol, (DRISDOL) 50000 units CAPS capsule TAKE 1 CAPSULE (50,000 UNITS TOTAL) BY MOUTH ONCE A WEEK.   No facility-administered encounter medications on file as of 04-27-18.     Self-harm Behaviors Risk Assessment Self-harm risk factors: Self-harm risk factors: (None Reported) Patient endorses recent thoughts of harming self: Have you recently had any thoughts about harming yourself?: No  [place] Suicide Severity Rating Scale:  C-SRSS 04/27/2018  1. Wish to be Dead No  2. Suicidal Thoughts No  6. Suicide Behavior Question No     Danger to Others Risk Assessment Danger to others risk factors: Danger to Others Risk Factors: No risk factors noted Patient endorses recent thoughts of harming others: Notification required: No need or identified person    Substance Use Assessment Patient recently consumed alcohol: Have you recently consumed alcohol?: No  Alcohol Use Disorder Identification Test (AUDIT):  Alcohol Use Disorder Test (AUDIT) 04-27-18  1. How often do you have a drink containing alcohol? 0  2. How many drinks containing alcohol do you have  on a typical day when you are drinking? 0  3. How often do you have six or more drinks on one occasion? 0  AUDIT-C Score 0  Intervention/Follow-up AUDIT Score <7 follow-up not indicated    Patient recently used drugs: Have you recently used any drugs?: No Patient is concerned about dependence or abuse of substances: Does patient seem concerned about dependence or abuse of any substance?: No    Goals, Interventions and Follow-up Plan Goals: Increase healthy adjustment to current life circumstances Interventions: Motivational Interviewing, Solution-Focused Strategies, Mindfulness or Psychologist, educational, Behavioral Activation, Brief  CBT and Supportive Counseling Follow-up Plan: [facility] Follow Up  Patient is to write in her journal a time in which she felt happy.  Summary of Clinical Assessment Summary:   Patient is a 47 year old female that reports depression and anxiety associated with marital issues and financial constraints.   Patient reports that she is not able to trust her husband because he left her in December 2018 and stayed away for a couple of months, withdrew $50,000 out of his retirement account.  Patient reports that he returned to the home in March 2019.   Patient reports that she continues to feel anxious and afraid that he will leave her again.  Patient reports that she is disabled since 2009 due to chronic back problems.  Therefore, she does not make enough money to pay for all of the household bills alone.    Patient was emotional during the assessment.  Patient reports that she has a psychiatrist (Dr. Harrington Challenger) and a therapist (Peggyb Bynum) but she has stated that she does not want to go back to this facility. Patient reports that couples counseling was attempted in the past but it was unsuccessful.    Patient reports that she has four grown children.  Patient reports that she does not do anything for fun anymore.     Graciella Freer [place], LCAS-A

## 2018-04-14 NOTE — Patient Instructions (Addendum)
F/u in 3 months, call if you need me sooner  You are being referred to new psychiatrist as you request, I recommend you  hold off on the remeron since it makes you too sleepy and take the xanax at bedtime and the escitalopram as prescribed, in the day  Burning in feet likley frrom back problems   HBA1C,fasting lipid, cmp and eGFR and vit D are to be done this week please

## 2018-04-15 NOTE — Progress Notes (Signed)
[facility] is consulted as the patient asks to change provider and therapist. Per chart review, patient has been seen by Dr. Harrington Challenger and Ms. Bynum many times in the past. [facility] specialist to advise the patient to discuss her concern with them or the nurse at the clinic for appropriate continuity of care/referral as needed.   Will make this patient inactive in our program given the patient is already in their care.

## 2018-04-17 ENCOUNTER — Ambulatory Visit (HOSPITAL_COMMUNITY): Payer: Medicare HMO | Admitting: Psychiatry

## 2018-04-17 ENCOUNTER — Other Ambulatory Visit: Payer: Self-pay

## 2018-04-17 MED ORDER — POTASSIUM CHLORIDE 20 MEQ/15ML (10%) PO SOLN: 20.0000 meq | Freq: Two times a day (BID) | ORAL | 1 refills | Status: DC

## 2018-04-18 ENCOUNTER — Encounter: Payer: Self-pay | Admitting: Family Medicine

## 2018-04-18 NOTE — Assessment & Plan Note (Signed)
C/o burning in feet which is a result of arthritis in her spine and neuropathy

## 2018-04-18 NOTE — Assessment & Plan Note (Signed)
Repeat pap is sent

## 2018-04-18 NOTE — Assessment & Plan Note (Signed)
Reports recent bleeding, has been followed/ evaluated by gyne for this problem

## 2018-04-18 NOTE — Assessment & Plan Note (Signed)
Annual exam as documented. . Immunization and cancer screening needs are specifically addressed at this visit.  

## 2018-04-19 ENCOUNTER — Encounter (HOSPITAL_COMMUNITY): Payer: Self-pay | Admitting: Emergency Medicine

## 2018-04-19 ENCOUNTER — Emergency Department (HOSPITAL_COMMUNITY)
Admission: EM | Admit: 2018-04-19 | Discharge: 2018-04-19 | Disposition: A | Discharge status: Home / Self Care | Payer: Medicare HMO | Attending: Emergency Medicine | Admitting: Emergency Medicine

## 2018-04-19 ENCOUNTER — Emergency Department (HOSPITAL_COMMUNITY): Payer: Medicare HMO

## 2018-04-19 DIAGNOSIS — R638 Other symptoms and signs concerning food and fluid intake: Secondary | ICD-10-CM | POA: Diagnosis not present

## 2018-04-19 DIAGNOSIS — Z86718 Personal history of other venous thrombosis and embolism: Secondary | ICD-10-CM | POA: Insufficient documentation

## 2018-04-19 DIAGNOSIS — R630 Anorexia: Secondary | ICD-10-CM | POA: Insufficient documentation

## 2018-04-19 DIAGNOSIS — K219 Gastro-esophageal reflux disease without esophagitis: Secondary | ICD-10-CM

## 2018-04-19 DIAGNOSIS — Z91040 Latex allergy status: Secondary | ICD-10-CM | POA: Diagnosis not present

## 2018-04-19 DIAGNOSIS — R05 Cough: Secondary | ICD-10-CM | POA: Diagnosis not present

## 2018-04-19 DIAGNOSIS — I10 Essential (primary) hypertension: Secondary | ICD-10-CM | POA: Insufficient documentation

## 2018-04-19 DIAGNOSIS — R072 Precordial pain: Secondary | ICD-10-CM | POA: Diagnosis not present

## 2018-04-19 DIAGNOSIS — Z79899 Other long term (current) drug therapy: Secondary | ICD-10-CM | POA: Diagnosis not present

## 2018-04-19 DIAGNOSIS — J45909 Unspecified asthma, uncomplicated: Secondary | ICD-10-CM | POA: Insufficient documentation

## 2018-04-19 DIAGNOSIS — E1140 Type 2 diabetes mellitus with diabetic neuropathy, unspecified: Secondary | ICD-10-CM | POA: Insufficient documentation

## 2018-04-19 DIAGNOSIS — R079 Chest pain, unspecified: Secondary | ICD-10-CM | POA: Insufficient documentation

## 2018-04-19 DIAGNOSIS — R5383 Other fatigue: Secondary | ICD-10-CM | POA: Diagnosis not present

## 2018-04-19 DIAGNOSIS — R112 Nausea with vomiting, unspecified: Secondary | ICD-10-CM | POA: Diagnosis not present

## 2018-04-19 DIAGNOSIS — R1011 Right upper quadrant pain: Secondary | ICD-10-CM | POA: Diagnosis not present

## 2018-04-19 LAB — BASIC METABOLIC PANEL
Anion gap: 11 (ref 5–15)
BUN: 13 mg/dL (ref 6–20)
CALCIUM: 9.6 mg/dL (ref 8.9–10.3)
CO2: 23 mmol/L (ref 22–32)
Chloride: 101 mmol/L (ref 101–111)
Creatinine, Ser: 1.18 mg/dL — ABNORMAL HIGH (ref 0.44–1.00)
GFR calc Af Amer: >60 mL/min (ref >60)
GFR, EST NON AFRICAN AMERICAN: 53 mL/min — AB (ref >60)
GLUCOSE: 92 mg/dL (ref 65–99)
Potassium: 3.1 mmol/L — ABNORMAL LOW (ref 3.5–5.1)
SODIUM: 135 mmol/L (ref 135–145)

## 2018-04-19 LAB — CBC
HCT: 40.6 % (ref 36.0–46.0)
Hemoglobin: 13.6 g/dL (ref 12.0–15.0)
MCH: 31.1 pg (ref 26.0–34.0)
MCHC: 33.5 g/dL (ref 30.0–36.0)
MCV: 92.9 fL (ref 78.0–100.0)
Platelets: 279 10*3/uL (ref 150–400)
RBC: 4.37 MIL/uL (ref 3.87–5.11)
RDW: 12.5 % (ref 11.5–15.5)
WBC: 6.5 10*3/uL (ref 4.0–10.5)

## 2018-04-19 LAB — HEPATIC FUNCTION PANEL
ALK PHOS: 57 U/L (ref 38–126)
ALT: 12 U/L — ABNORMAL LOW (ref 14–54)
AST: 18 U/L (ref 15–41)
Albumin: 4.3 g/dL (ref 3.5–5.0)
BILIRUBIN TOTAL: 0.5 mg/dL (ref 0.3–1.2)
Total Protein: 7.7 g/dL (ref 6.5–8.1)

## 2018-04-19 LAB — I-STAT BETA HCG BLOOD, ED (MC, WL, AP ONLY): I-stat hCG, quantitative: <5 m[IU]/mL (ref <5)

## 2018-04-19 LAB — I-STAT TROPONIN, ED: TROPONIN I, POC: 0 ng/mL (ref 0.00–0.08)

## 2018-04-19 LAB — LIPASE, BLOOD: LIPASE: 50 U/L (ref 11–51)

## 2018-04-19 MED ORDER — SODIUM CHLORIDE 0.9 % IV BOLUS
1000.0000 mL | Freq: Once | INTRAVENOUS | Status: AC
  Administered 2018-04-19: 1000 mL via INTRAVENOUS

## 2018-04-19 MED ORDER — SUCRALFATE 1 GM/10ML PO SUSP: 1.0000 g | Freq: Three times a day (TID) | ORAL | 0 refills | Status: DC

## 2018-04-19 MED ORDER — POTASSIUM CHLORIDE 10 MEQ/100ML IV SOLN
10.0000 meq | Freq: Once | INTRAVENOUS | Status: AC
  Administered 2018-04-19: 10 meq via INTRAVENOUS
  Filled 2018-04-19: qty 100

## 2018-04-19 MED ORDER — SUCRALFATE 1 G PO TABS
1.0000 g | ORAL_TABLET | Freq: Once | ORAL | Status: AC
  Administered 2018-04-19: 1 g via ORAL
  Filled 2018-04-19: qty 1

## 2018-04-19 MED ORDER — GI COCKTAIL ~~LOC~~
30.0000 mL | Freq: Once | ORAL | Status: AC
  Administered 2018-04-19: 30 mL via ORAL
  Filled 2018-04-19: qty 30

## 2018-04-19 NOTE — ED Notes (Signed)
Pt alert and oriented in NAD. Pt verbalized understanding of discharge instructions. 

## 2018-04-19 NOTE — ED Provider Notes (Signed)
[facility] DEPARTMENT Provider Note   CSN: [id number] Arrival date & time: 04/19/18  2694     History   Chief Complaint Chief Complaint  Patient presents with  . Chest Pain    HPI Jane English is a 47 y.o. female.  She presents with 1 week of decreased appetite and burning in her central chest and feeling fatigued and weak and also pins-and-needles in her feet.  She cannot think of an obvious trigger for these symptoms.  She has a history of chronic constipation and had abdominal surgery for that.  No sick contacts at home no recent change in medicines.  She denies any fevers chills cough or urinary symptoms.  Said the pain is 8 out of 10 in her chest describes it as burning but also pressure and sharp.  The history is provided by the patient and the spouse.  Chest Pain   This is a new problem. The current episode started more than 2 days ago. The problem occurs constantly. The problem has not changed since onset.The pain is present in the substernal region. The pain is at a severity of 8/10. The quality of the pain is described as burning, pressure-like and sharp. The pain does not radiate. Associated symptoms include dizziness, malaise/fatigue, numbness (paresthesias feet) and weakness. Pertinent negatives include no abdominal pain, no fever, no shortness of breath, no sputum production and no syncope. She has tried nothing for the symptoms. The treatment provided no relief.    Past Medical History:  Diagnosis Date  . Anemia   . Asthma   . Asthma flare 04/09/2013  . Back pain   . Bronchitis   . Chronic abdominal pain   . Chronic constipation   . Constipation due to opioid therapy   . Depression   . Diabetes mellitus without complication ([place])   . Diabetes mellitus, type II ([place])   . DVT (deep venous thrombosis) ([place]) 2010  . GERD (gastroesophageal reflux disease)   . Heart murmur    no cardiologist  . Helicobacter pylori gastritis 06/11/2013   Colonoscopy Dr. Hilarie Fredrickson  . Hypertension   . IBS (irritable bowel syndrome)   . Migraine headache   . Neuropathy   . Obesity   . Obsessive-compulsive disorder   . PSYCHOTIC D/O W/HALLUCINATIONS CONDS CLASS ELSW 03/04/2010   Qualifier: Diagnosis of  By: Moshe Cipro MD, Joycelyn Schmid    . PTSD (post-traumatic stress disorder)   . SBO (small bowel obstruction) ([place]) 08/09/2013  . Seasonal allergies 12/10/2012  . Seizures ([place])   . Shortness of breath     Patient Active Problem List   Diagnosis Date Noted  . Vitamin D deficiency 12/14/2017  . Bilateral ankle pain 04/16/2017  . Amenorrhea 10/15/2016  . Abnormal vaginal Pap smear 10/14/2016  . Paresthesia 05/06/2016  . Back pain with radiation 04/04/2016  . Overweight (BMI 25.0-29.9) 03/25/2016  . Neuropathic pain of finger of right hand 12/31/2015  . Pap smear of cervix shows high risk HPV present 08/08/2015  . Annual physical exam 08/04/2015  . Cyst of left ovary 02/06/2015  . Anovulation 02/06/2015  . Secondary amenorrhea 02/06/2015  . Intractable chronic migraine without aura and without status migrainosus 12/21/2014  . Migraine 09/21/2014  . Anemia 10/11/2013  . H. pylori duodenitis 07/07/2013  . PTSD (post-traumatic stress disorder) 03/26/2013  . Insomnia 03/18/2013  . Seasonal allergies 12/10/2012  . GERD (gastroesophageal reflux disease) 10/29/2012  . Chronic pain syndrome 10/06/2012  . Postlaminectomy syndrome 10/06/2012  .  Cervical neck pain with evidence of disc disease 04/13/2012  . Depression with anxiety 08/30/2010  . Allergic rhinitis 04/17/2010  . Cough variant asthma 03/04/2010  . Bipolar disorder ([place]) 01/08/2009  . Prediabetes 01/05/2009  . Essential hypertension 11/27/2007    Past Surgical History:  Procedure Laterality Date  . ANTERIOR CERVICAL DECOMP/DISCECTOMY FUSION  07/07/2012   Procedure: ANTERIOR CERVICAL DECOMPRESSION/DISCECTOMY FUSION 2 LEVELS;  Surgeon: Floyce Stakes, MD;  Location: [facility];   Service: Neurosurgery;  Laterality: N/A;  Cervical four-five, five - six  Anterior cervical decompression/diskectomy/fusion/plate  . APPENDECTOMY  1986  . BOWEL RESECTION N/A 07/29/2013   Procedure: serosal repair;  Surgeon: Adin Hector, MD;  Location: [facility];  Service: General;  Laterality: N/A;  . CARPAL TUNNEL RELEASE Bilateral   . LAPAROSCOPY N/A 07/29/2013   Procedure: diagnostic laporoscopy;  Surgeon: Adin Hector, MD;  Location: [facility];  Service: General;  Laterality: N/A;  . LAPAROSCOPY N/A 08/16/2013   Procedure: LAPAROSCOPY DIAGNOSTIC/LYSIS OF ADHESIONS;  Surgeon: Adin Hector, MD;  Location: [facility];  Service: General;  Laterality: N/A;  . LAPAROTOMY N/A 08/16/2013   Procedure: EXPLORATORY LAPAROTOMY/SMALL BOWEL RESECTION (JEJUNUM);  Surgeon: Adin Hector, MD;  Location: [facility];  Service: General;  Laterality: N/A;  . LUMBAR SPINE SURGERY  2010   x 3  . LYSIS OF ADHESION  2003   Dr. Irving Shows  . LYSIS OF ADHESION N/A 07/29/2013   Procedure: LYSIS OF ADHESION;  Surgeon: Adin Hector, MD;  Location: [facility];  Service: General;  Laterality: N/A;  . OOPHORECTOMY    . PARTIAL HYSTERECTOMY  1990s?   [place], [place]  . SPINAL CORD STIMULATOR IMPLANT    . TRIGGER FINGER RELEASE  2009   right pinkie finger  . TUBAL LIGATION  1994     OB History    Gravida  5   Para  4   Term  4   Preterm      AB  1   Living  4     SAB  1   TAB      Ectopic      Multiple      Live Births  4            Home Medications    Prior to Admission medications   Medication Sig Start Date End Date Taking? Authorizing Provider  albuterol (PROVENTIL) (2.5 MG/3ML) 0.083% nebulizer solution Take 3 mLs (2.5 mg total) by nebulization every 6 (six) hours as needed for wheezing or shortness of breath. 10/30/17  Yes Fayrene Helper, MD  ALPRAZolam Duanne Moron) 1 MG tablet Take 1 tablet (1 mg total) by mouth daily as needed for anxiety. 03/24/18 03/24/19 Yes Cloria Spring, MD    azelastine (ASTELIN) 0.1 % nasal spray USE 2 SPRAYS IN EACH NOSTRIL TWICE A DAY 10/01/16  Yes Fayrene Helper, MD  Beclomethasone Diprop HFA (QVAR REDIHALER) 40 MCG/ACT AERB Inhale 2 puffs into the lungs 2 (two) times daily. 01/07/17  Yes Fayrene Helper, MD  cloNIDine (CATAPRES) 0.1 MG tablet TAKE 1 TABLET BY MOUTH EVERYDAY AT BEDTIME 01/15/18  Yes Fayrene Helper, MD  escitalopram (LEXAPRO) 20 MG tablet Take 1 tablet (20 mg total) by mouth daily. 04/03/18 04/03/19 Yes Cloria Spring, MD  fluticasone [facility]) 50 MCG/ACT nasal spray Place 2 sprays into both nostrils daily as needed for rhinitis.   Yes [provider]  linaclotide (LINZESS) 290 MCG CAPS capsule Take 1 capsule (  290 mcg total) by mouth daily before breakfast. 03/31/18  Yes Pyrtle, Lajuan Lines, MD  loratadine (CLARITIN) 10 MG tablet Take 1 tablet (10 mg total) by mouth daily. 10/30/17  Yes Fayrene Helper, MD  lubiprostone (AMITIZA) 24 MCG capsule Take 1 capsule (24 mcg total) by mouth 2 (two) times daily with a meal. Patient taking differently: Take 24 mcg by mouth daily.  03/31/18  Yes Pyrtle, Lajuan Lines, MD  montelukast (SINGULAIR) 10 MG tablet TAKE 1 TABLET BY MOUTH EVERYDAY AT BEDTIME 02/11/18  Yes Fayrene Helper, MD  [facility] 4 MG/0.1ML LIQD nasal spray kit Place 4 mg into the nose as directed. 03/10/18  Yes [provider]  Oxycodone HCl 10 MG TABS Take 10 mg by mouth every 12 (twelve) hours as needed (for pain).    Yes [provider]  pantoprazole (PROTONIX) 40 MG tablet TAKE 1 TABLET BY MOUTH EVERY DAY 10/06/17  Yes Pyrtle, Lajuan Lines, MD  polyethylene glycol powder (GLYCOLAX/MIRALAX) powder Take 17 grams (1 capful) dissolved in at least 8 ounces water juice and drink 3 times daily 03/31/18  Yes Pyrtle, Lajuan Lines, MD  potassium chloride 20 MEQ/15ML (10%) SOLN Take 15 mLs (20 mEq total) by mouth 2 (two) times daily. 04/17/18  Yes Fayrene Helper, MD  spironolactone (ALDACTONE) 25 MG tablet Take 1 tablet (25  mg total) by mouth daily. 02/13/18  Yes Fayrene Helper, MD  topiramate (TOPAMAX) 50 MG tablet Take 2 tablets (100 mg total) by mouth 2 (two) times daily. 07/08/17  Yes Marcial Pacas, MD  triamterene-hydrochlorothiazide (MAXZIDE-25) 37.5-25 MG tablet 1 tablet daily Patient taking differently: Take 0.5 tablets by mouth daily.  10/30/17  Yes Fayrene Helper, MD  Vitamin D, Ergocalciferol, (DRISDOL) 50000 units CAPS capsule TAKE 1 CAPSULE (50,000 UNITS TOTAL) BY MOUTH ONCE A WEEK. 03/12/18  Yes Fayrene Helper, MD  ciclopirox [facility]) 8 % solution Apply topically at bedtime. Apply over nail and surrounding skin. Apply daily over previous coat. After seven (7) days, may remove with alcohol and continue cycle. Patient not taking: Reported on 04/19/2018 12/08/17   Fayrene Helper, MD  clotrimazole-betamethasone (LOTRISONE) cream Apply topically 2 (two) times daily. Patient not taking: Reported on 04/19/2018 12/08/17   Fayrene Helper, MD  SUMAtriptan (IMITREX) 6 MG/0.5ML SOLN injection Inject 0.5 mLs (6 mg total) into the skin every 2 (two) hours as needed for migraine or headache. May repeat in 2 hours if headache persists or recurs. Patient not taking: Reported on 04/19/2018 05/16/16   Marcial Pacas, MD  temazepam (RESTORIL) 30 MG capsule Take 1 capsule (30 mg total) by mouth at bedtime as needed for sleep. Patient not taking: Reported on 04/19/2018 04/03/18   Cloria Spring, MD    Family History Family History  Problem Relation Age of Onset  . Lung cancer Father   . Stomach cancer Father   . Esophageal cancer Father   . Alcohol abuse Father   . Mental illness Father   . Diabetes Sister   . Hypertension Sister   . Bipolar disorder Sister   . Schizophrenia Sister   . Diabetes Sister   . Alcohol abuse Brother   . Hypertension Brother   . Kidney disease Brother   . Diabetes Brother   . Drug abuse Brother   . Mental illness Brother   . Alcohol abuse Brother   . Alcohol abuse Brother   .  Hypertension Brother   . Diabetes Brother   . Alcohol abuse  Brother   . Physical abuse Mother   . Alcohol abuse Mother   . Cirrhosis Mother   . Mental illness Brother        in [place]  . Drug abuse Sister   . HIV Sister   . Alcohol abuse Brother   . Pneumonia Sister        died as a baby  . Alcohol abuse Brother   . Bipolar disorder Brother   . Bipolar disorder Daughter   . Bipolar disorder Son   . Bipolar disorder Son   . Hypertension Brother   . Bipolar disorder Brother   . Drug abuse Brother   . Alcohol abuse Brother   . Bipolar disorder Brother   . ADD / ADHD Neg Hx   . Anxiety disorder Neg Hx   . Dementia Neg Hx   . Depression Neg Hx   . OCD Neg Hx   . Seizures Neg Hx   . Paranoid behavior Neg Hx   . Colon cancer Neg Hx     Social History Social History   Tobacco Use  . Smoking status: Never Smoker  . Smokeless tobacco: Never Used  Substance Use Topics  . Alcohol use: No  . Drug use: No     Allergies   Neurontin [gabapentin]; Penicillins; Pregabalin; Tramadol; and Latex   Review of Systems Review of Systems  Constitutional: Positive for appetite change and malaise/fatigue. Negative for fever.  HENT: Negative for rhinorrhea and sore throat.   Eyes: Negative for visual disturbance.  Respiratory: Negative for sputum production and shortness of breath.   Cardiovascular: Positive for chest pain. Negative for syncope.  Gastrointestinal: Negative for abdominal pain.  Genitourinary: Negative for dysuria and hematuria.  Musculoskeletal: Negative for joint swelling and neck pain.  Skin: Negative for rash.  Neurological: Positive for dizziness, weakness and numbness (paresthesias feet).     Physical Exam Updated Vital Signs BP 116/78   Pulse 65   Temp 98.1 F (36.7 C)   Resp 20   Ht _0  (1.676 m)   Wt 71.7 kg (158 lb)   LMP 04/05/2018   SpO2 99%   BMI 25.50 kg/m   Physical Exam  Constitutional: She appears well-developed and well-nourished. No  distress.  HENT:  Head: Normocephalic and atraumatic.  Eyes: Pupils are equal, round, and reactive to light. Conjunctivae and EOM are normal.  Neck: Normal range of motion. Neck supple.  Cardiovascular: Normal rate, regular rhythm and normal pulses.  No murmur heard. Pulmonary/Chest: Effort normal and breath sounds normal. No respiratory distress.  Abdominal: Soft. There is no tenderness.  Musculoskeletal: She exhibits no edema.       Right lower leg: Normal. She exhibits no tenderness and no edema.       Left lower leg: Normal. She exhibits no tenderness and no edema.  Neurological: She is alert.  Skin: Skin is warm and dry.  Psychiatric: She has a normal mood and affect.  Nursing note and vitals reviewed.    ED Treatments / Results  Labs (all labs ordered are listed, but only abnormal results are displayed) Labs Reviewed  BASIC METABOLIC PANEL - Abnormal; Notable for the following components:      Result Value   Potassium 3.1 (*)    Creatinine, Ser 1.18 (*)    GFR calc non Af Amer 53 (*)    All other components within normal limits  CBC  I-STAT TROPONIN, ED  I-STAT BETA HCG BLOOD, ED ([facility], [facility], [facility] ONLY)  EKG None  Radiology Dg Chest 2 View  Result Date: 04/19/2018 CLINICAL DATA:  Central chest pain x 2 days, SOB, cough/cong and fever x 3-4 days, no appetite, N/V. HTN, nonsmoker. Hx DM and asthma, spinal cord stim implant EXAM: CHEST - 2 VIEW COMPARISON:  Chest x-ray dated 11/11/2016. FINDINGS: The heart size and mediastinal contours are within normal limits. Both lungs are clear. The visualized skeletal structures are unremarkable. Stimulator wires are stable in position with tip at the level of the midthoracic spine. IMPRESSION: No active cardiopulmonary disease. No evidence of pneumonia or pulmonary edema. Electronically Signed   By: Franki Cabot M.D.   On: 04/19/2018 19:27    Procedures Procedures (including critical care time)  Medications Ordered in  ED Medications  sodium chloride 0.9 % bolus 1,000 mL (0 mLs Intravenous Stopped 04/19/18 2152)  potassium chloride 10 mEq in 100 mL IVPB (0 mEq Intravenous Stopped 04/19/18 2159)  gi cocktail (Maalox,Lidocaine,Donnatal) (30 mLs Oral Given 04/19/18 2035)  gi cocktail (Maalox,Lidocaine,Donnatal) (30 mLs Oral Given 04/19/18 2206)  sucralfate (CARAFATE) tablet 1 g (1 g Oral Given 04/19/18 2212)     Initial Impression / Assessment and Plan / ED Course  I have reviewed the triage vital signs and the nursing notes.  Pertinent labs & imaging results that were available during my care of the patient were reviewed by me and considered in my medical decision making (see chart for details).  Clinical Course as of Apr 21 1115  Sun Apr 19, 2018  2109 Viewed the current labs with the patient and updated on the plan.  She had a GI cocktail and had some improvement in her chest pain symptoms.   [MB]  2254 Reviewed the labs with the patient and her husband.  We also discussed the results of the ultrasound showing no cholelithiasis no cholecystitis.  They are comfortable going home we will put her on some Carafate and she has a GI doctor to follow-up with for continued management of her symptoms.   [MB]    Clinical Course User Index [MB] Hayden Rasmussen, MD    Final Clinical Impressions(s) / ED Diagnoses   Final diagnoses:  Nonspecific chest pain  Decreased appetite  Gastroesophageal reflux disease, esophagitis presence not specified    ED Discharge Orders        Ordered    sucralfate (CARAFATE) 1 GM/10ML suspension  3 times daily with meals & bedtime     04/19/18 2256       Hayden Rasmussen, MD 04/20/18 [phone]

## 2018-04-19 NOTE — ED Notes (Signed)
Patient transported to Ultrasound 

## 2018-04-19 NOTE — ED Triage Notes (Signed)
Pt states 2 days of centralized chest pain radiating into the right chest that comes and goes, describes it as a burning. Pain currently 8/10.

## 2018-04-19 NOTE — Discharge Instructions (Addendum)
Your evaluated in the emergency department for decreased appetite and chest wall burning along with pins-and-needles in your feet.  We did find your potassium to be slightly low at 3.1 and you were given some potassium.  There was no evidence of any heart attack on your blood work EKG and chest x-ray.  You were somewhat improved with a GI cocktail and this points to likely being reflux as the cause of some of your symptoms.  We are prescribing you some Carafate and you need to contact your GI doctor for further work-up of this.

## 2018-04-20 ENCOUNTER — Telehealth: Payer: Self-pay | Admitting: Family Medicine

## 2018-04-20 LAB — CYTOLOGY - PAP
DIAGNOSIS: UNDETERMINED — AB
HPV (WINDOPATH): DETECTED — AB

## 2018-04-20 NOTE — Telephone Encounter (Signed)
Patient called today and said she is still waiting for a call to set her up with a doctor and to also have the new doctor review her medication since you all have changed her doctors.  Please call her at [phone].

## 2018-04-21 ENCOUNTER — Other Ambulatory Visit: Payer: Self-pay | Admitting: Family Medicine

## 2018-04-21 DIAGNOSIS — R87610 Atypical squamous cells of undetermined significance on cytologic smear of cervix (ASC-US): Secondary | ICD-10-CM

## 2018-04-21 DIAGNOSIS — R87810 Cervical high risk human papillomavirus (HPV) DNA test positive: Secondary | ICD-10-CM

## 2018-04-21 NOTE — Telephone Encounter (Signed)
I have no problem with her switching providers

## 2018-04-22 NOTE — Telephone Encounter (Signed)
Ava, you can schedule her with me for 30 mins follow up.

## 2018-04-27 ENCOUNTER — Ambulatory Visit (HOSPITAL_COMMUNITY): Payer: Self-pay | Admitting: Psychiatry

## 2018-04-28 ENCOUNTER — Encounter: Payer: Self-pay | Admitting: Family Medicine

## 2018-04-28 ENCOUNTER — Telehealth: Payer: Self-pay

## 2018-04-28 ENCOUNTER — Telehealth: Payer: Self-pay | Admitting: Family Medicine

## 2018-04-28 ENCOUNTER — Ambulatory Visit (INDEPENDENT_AMBULATORY_CARE_PROVIDER_SITE_OTHER): Payer: Medicare HMO | Admitting: Family Medicine

## 2018-04-28 VITALS — BP 110/70 | HR 72 | Resp 16 | Ht 66.0 in | Wt 152.0 lb

## 2018-04-28 DIAGNOSIS — F418 Other specified anxiety disorders: Secondary | ICD-10-CM

## 2018-04-28 DIAGNOSIS — E559 Vitamin D deficiency, unspecified: Secondary | ICD-10-CM

## 2018-04-28 DIAGNOSIS — R7303 Prediabetes: Secondary | ICD-10-CM

## 2018-04-28 DIAGNOSIS — F5104 Psychophysiologic insomnia: Secondary | ICD-10-CM | POA: Diagnosis not present

## 2018-04-28 DIAGNOSIS — Z13220 Encounter for screening for lipoid disorders: Secondary | ICD-10-CM | POA: Diagnosis not present

## 2018-04-28 DIAGNOSIS — R69 Illness, unspecified: Secondary | ICD-10-CM | POA: Diagnosis not present

## 2018-04-28 DIAGNOSIS — I10 Essential (primary) hypertension: Secondary | ICD-10-CM | POA: Diagnosis not present

## 2018-04-28 MED ORDER — MIRTAZAPINE 7.5 MG PO TABS: 7.5000 mg | ORAL_TABLET | Freq: Every day | ORAL | 2 refills | Status: DC

## 2018-04-28 NOTE — Telephone Encounter (Signed)
Was seen in office today

## 2018-04-28 NOTE — Telephone Encounter (Signed)
Patient reports that her psychiatric medication is not working because she continues to haven low energy, weight loss and a feeling of beng lost.    Patient is established with [facility].  Writer arranged for patient to have an earlier appt with Dr.Hisada on 05/29/2018. Patient has a follow up appt with her therapist Maurice Small on 05-19-2018  Patient denies SI/HI/Psychosis/Substancen Abuse.  Patient  Is inactive with [facility] because the patient receives services with [facility].

## 2018-04-28 NOTE — Telephone Encounter (Signed)
Patient left message requesting a call back to phone # [phone]/ [phone] to schedule an appt. I called patient back & she said she was in the parking lot coming up to the office.

## 2018-04-28 NOTE — Patient Instructions (Signed)
F/U as before, call if you need me sooner  New for sleep and appetite and to help wih the healing process is Remeron one at bedtime  You will speak with the therapist today before you leave  Labs today  HBA1C, lipid, chem 7 and EGFR, and vit D, new form to be collected from nurse  Thank you  for choosing [facility]. We consider it a privelige to serve you.  Delivering excellent health care in a caring and  compassionate way is our goal.  Partnering with you,  so that together we can achieve this goal is our strategy.

## 2018-04-29 ENCOUNTER — Ambulatory Visit (HOSPITAL_COMMUNITY): Payer: Medicare HMO | Admitting: Psychiatry

## 2018-04-29 ENCOUNTER — Encounter (HOSPITAL_COMMUNITY): Payer: Self-pay | Admitting: Psychiatry

## 2018-04-29 VITALS — BP 100/64 | HR 92 | Ht 66.0 in | Wt 152.0 lb

## 2018-04-29 DIAGNOSIS — F4310 Post-traumatic stress disorder, unspecified: Secondary | ICD-10-CM

## 2018-04-29 DIAGNOSIS — F3175 Bipolar disorder, in partial remission, most recent episode depressed: Secondary | ICD-10-CM | POA: Diagnosis not present

## 2018-04-29 DIAGNOSIS — R69 Illness, unspecified: Secondary | ICD-10-CM | POA: Diagnosis not present

## 2018-04-29 LAB — VITAMIN D 25 HYDROXY (VIT D DEFICIENCY, FRACTURES): Vit D, 25-Hydroxy: 36 ng/mL (ref 30–100)

## 2018-04-29 LAB — BASIC METABOLIC PANEL WITH GFR
BUN/Creatinine Ratio: 13 (calc) (ref 6–22)
BUN: 16 mg/dL (ref 7–25)
CALCIUM: 9.9 mg/dL (ref 8.6–10.2)
CO2: 23 mmol/L (ref 20–32)
CREATININE: 1.26 mg/dL — AB (ref 0.50–1.10)
Chloride: 103 mmol/L (ref 98–110)
GFR, EST NON AFRICAN AMERICAN: 50 mL/min/{1.73_m2} — AB (ref >=60)
GFR, Est African American: 58 mL/min/{1.73_m2} — ABNORMAL LOW (ref >=60)
Glucose, Bld: 96 mg/dL (ref 65–99)
POTASSIUM: 3.3 mmol/L — AB (ref 3.5–5.3)
Sodium: 137 mmol/L (ref 135–146)

## 2018-04-29 LAB — LIPID PANEL
CHOL/HDL RATIO: 4 (calc) (ref <5.0)
CHOLESTEROL: 185 mg/dL (ref <200)
HDL: 46 mg/dL — ABNORMAL LOW (ref >50)
LDL Cholesterol (Calc): 120 mg/dL (calc) — ABNORMAL HIGH
Non-HDL Cholesterol (Calc): 139 mg/dL (calc) — ABNORMAL HIGH (ref <130)
Triglycerides: 90 mg/dL (ref <150)

## 2018-04-29 LAB — HEMOGLOBIN A1C
EAG (MMOL/L): 6.5 (calc)
Hgb A1c MFr Bld: 5.7 % of total Hgb — ABNORMAL HIGH (ref <5.7)
MEAN PLASMA GLUCOSE: 117 (calc)

## 2018-04-29 MED ORDER — ARIPIPRAZOLE 2 MG PO TABS: 2.0000 mg | ORAL_TABLET | Freq: Every day | ORAL | 0 refills | Status: DC

## 2018-04-29 NOTE — Patient Instructions (Signed)
1. Continue lexapro 20 mg daily  2. Continue mirtazapne 7.5 mg  3. Start Abilify 2 mg daily   4. Continue Xanax 1 mg daily as needed for anxiety 5. Return to clinic in one month for 30 mins
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
## 2018-05-03 ENCOUNTER — Encounter: Payer: Self-pay | Admitting: Family Medicine

## 2018-05-03 NOTE — Assessment & Plan Note (Signed)
Controlled, no change in medication DASH diet and commitment to daily physical activity for a minimum of 30 minutes discussed and encouraged, as a part of hypertension management. The importance of attaining a healthy weight is also discussed.  BP/Weight 04/28/2018 04/19/2018 04/14/2018 04/01/2018 03/31/2018 02/17/8308 4/0/7680  Systolic BP 881 103 159 458 92 592 924  Diastolic BP 70 80 70 80 54 80 70  Wt. (Lbs) 152 158 156 160.12 161.4 165 173  BMI 24.53 25.5 25.18 25.84 26.45 26.63 27.92  Some encounter information is confidential and restricted. Go to Review Flowsheets activity to see all data.

## 2018-05-03 NOTE — Assessment & Plan Note (Signed)
Patient educated about the importance of limiting  Carbohydrate intake , the need to commit to daily physical activity for a minimum of 30 minutes , and to commit weight loss. The fact that changes in all these areas will reduce or eliminate all together the development of diabetes is stressed.   Diabetic Labs Latest Ref Rng & Units 04/28/2018 04/19/2018 12/08/2017 06/09/2017 11/11/2016  HbA1c <5.7 % of total Hgb 5.7(H) - 6.0(H) 5.8(H) 5.9(H)  Microalbumin 0.00 - 1.89 mg/dL - - - - -  Micro/Creat Ratio 0.0 - 30.0 mg/g - - - - -  Chol <200 mg/dL 185 - - 152 -  HDL >50 mg/dL 46(L) - - 53 -  Calc LDL mg/dL (calc) 120(H) - - 73 -  Triglycerides <150 mg/dL 90 - - 132 -  Creatinine 0.50 - 1.10 mg/dL 1.26(H) 1.18(H) 1.14(H) 0.99 1.24(H)  GFR >60.00 mL/min - - - - -   BP/Weight 04/28/2018 04/19/2018 04/14/2018 04/01/2018 03/31/2018 04/07/7997 05/05/1586  Systolic BP 276 184 859 276 92 394 320  Diastolic BP 70 80 70 80 54 80 70  Wt. (Lbs) 152 158 156 160.12 161.4 165 173  BMI 24.53 25.5 25.18 25.84 26.45 26.63 27.92  Some encounter information is confidential and restricted. Go to Review Flowsheets activity to see all data.   No flowsheet data found.

## 2018-05-03 NOTE — Assessment & Plan Note (Signed)
Needs treatment as she reports worsened symptom as well as poor apetite and weight loss, trial of low dose Remeron. Hopefully she will soon resume treatment with psychiatry

## 2018-05-03 NOTE — Progress Notes (Signed)
Jane English     MRN: [id number]      DOB: 1971-01-10   HPI Jane English is here for follow up of recent Ed visit when she went with  a c/o abdominal pain , her only dx at the time was hypokalemia. She is tearful c/o excessive fatigue, poor apetite and weight loss. She is afraid, states she is extremely depressed, seeking help, unable to travel to [facility] to psychiatry and wonders if she can be seen at the local clinic where she has been treated for years where she recently stated she never wanted to  Return to ROS Denies recent fever or chills. Denies sinus pressure, nasal congestion, ear pain or sore throat. Denies chest congestion, productive cough or wheezing. Denies chest pains, palpitations and leg swelling .   Denies dysuria, frequency, hesitancy or incontinence. Denies joint pain, swelling and limitation in mobility. Denies headaches, seizures, numbness, or tingling. Denies skin break down or rash.   PE  BP 110/70   Pulse 72   Resp 16   Ht 5\' 6"  (1.676 m)   Wt 152 lb (68.9 kg)   LMP 04/05/2018   SpO2 97%   BMI 24.53 kg/m   Patient alert , ill appearing, looks scared , and in no cardiopulmonary distress.  HEENT: No facial asymmetry, EOMI,   oropharynx pink and moist.  Neck supple no JVD, no mass.  Chest: Clear to auscultation bilaterally.  CVS: S1, S2 no murmurs, no S3.Regular rate.  ABD: Soft non tender.   Ext: No edema  MS: Adequate ROM spine, shoulders, hips and knees.  Skin: Intact, no ulcerations or rash noted.  Psych: Poor  eye contact, flat  anxious or depressed appearing.  CNS: CN 2-12 intact, power,  normal throughout.no focal deficits noted.   Assessment & Plan  Depression with anxiety Severe and uncontrolled, not actively suicidal or homicidal , however, desperately requires psychiatric care, pt is to have telepsychology  Interview prior to departure from the clinic again today, and the hope is that shew will be able to be seen by local  Psychiatry once more  Prediabetes Patient educated about the importance of limiting  Carbohydrate intake , the need to commit to daily physical activity for a minimum of 30 minutes , and to commit weight loss. The fact that changes in all these areas will reduce or eliminate all together the development of diabetes is stressed.   Diabetic Labs Latest Ref Rng & Units 04/28/2018 04/19/2018 12/08/2017 06/09/2017 11/11/2016  HbA1c <5.7 % of total Hgb 5.7(H) - 6.0(H) 5.8(H) 5.9(H)  Microalbumin 0.00 - 1.89 mg/dL - - - - -  Micro/Creat Ratio 0.0 - 30.0 mg/g - - - - -  Chol <200 mg/dL 185 - - 152 -  HDL >50 mg/dL 46(L) - - 53 -  Calc LDL mg/dL (calc) 120(H) - - 73 -  Triglycerides <150 mg/dL 90 - - 132 -  Creatinine 0.50 - 1.10 mg/dL 1.26(H) 1.18(H) 1.14(H) 0.99 1.24(H)  GFR >60.00 mL/min - - - - -   BP/Weight 04/28/2018 04/19/2018 04/14/2018 04/01/2018 03/31/2018 06/30/785 05/09/4491  Systolic BP 010 071 219 758 92 832 549  Diastolic BP 70 80 70 80 54 80 70  Wt. (Lbs) 152 158 156 160.12 161.4 165 173  BMI 24.53 25.5 25.18 25.84 26.45 26.63 27.92  Some encounter information is confidential and restricted. Go to Review Flowsheets activity to see all data.   No flowsheet data found.    Essential hypertension Controlled, no  change in medication DASH diet and commitment to daily physical activity for a minimum of 30 minutes discussed and encouraged, as a part of hypertension management. The importance of attaining a healthy weight is also discussed.  BP/Weight 04/28/2018 04/19/2018 04/14/2018 04/01/2018 03/31/2018 0/39/7953 04/12/2229  Systolic BP 097 949 971 820 92 990 689  Diastolic BP 70 80 70 80 54 80 70  Wt. (Lbs) 152 158 156 160.12 161.4 165 173  BMI 24.53 25.5 25.18 25.84 26.45 26.63 27.92  Some encounter information is confidential and restricted. Go to Review Flowsheets activity to see all data.       Insomnia Needs treatment as she reports worsened symptom as well as poor apetite and weight  loss, trial of low dose Remeron. Hopefully she will soon resume treatment with psychiatry

## 2018-05-03 NOTE — Assessment & Plan Note (Signed)
Severe and uncontrolled, not actively suicidal or homicidal , however, desperately requires psychiatric care, pt is to have telepsychology  Interview prior to departure from the clinic again today, and the hope is that shew will be able to be seen by local Psychiatry once more

## 2018-05-05 ENCOUNTER — Ambulatory Visit (HOSPITAL_COMMUNITY): Payer: Medicare HMO | Admitting: Psychiatry

## 2018-05-11 ENCOUNTER — Ambulatory Visit (HOSPITAL_COMMUNITY): Payer: Self-pay | Admitting: Psychiatry

## 2018-05-18 ENCOUNTER — Encounter: Payer: Medicare HMO | Admitting: Obstetrics and Gynecology

## 2018-05-19 ENCOUNTER — Ambulatory Visit (HOSPITAL_COMMUNITY): Payer: Medicare HMO | Admitting: Psychiatry

## 2018-05-19 ENCOUNTER — Encounter

## 2018-05-19 DIAGNOSIS — F3175 Bipolar disorder, in partial remission, most recent episode depressed: Secondary | ICD-10-CM | POA: Diagnosis not present

## 2018-05-19 DIAGNOSIS — R69 Illness, unspecified: Secondary | ICD-10-CM | POA: Diagnosis not present

## 2018-05-19 DIAGNOSIS — F4310 Post-traumatic stress disorder, unspecified: Secondary | ICD-10-CM | POA: Diagnosis not present
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
## 2018-05-20 ENCOUNTER — Other Ambulatory Visit: Payer: Self-pay | Admitting: Family Medicine

## 2018-05-22 ENCOUNTER — Other Ambulatory Visit (HOSPITAL_COMMUNITY): Payer: Self-pay | Admitting: Psychiatry

## 2018-05-22 MED ORDER — ARIPIPRAZOLE 2 MG PO TABS: 2.0000 mg | ORAL_TABLET | Freq: Every day | ORAL | 0 refills | Status: DC

## 2018-05-23 ENCOUNTER — Other Ambulatory Visit: Payer: Self-pay | Admitting: Family Medicine

## 2018-05-25 ENCOUNTER — Encounter (HOSPITAL_COMMUNITY): Payer: Self-pay | Admitting: Psychiatry

## 2018-05-25 ENCOUNTER — Ambulatory Visit (HOSPITAL_COMMUNITY): Payer: Self-pay | Admitting: Psychiatry
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
## 2018-05-25 NOTE — Telephone Encounter (Signed)
This encounter was created in error - please disregard.

## 2018-05-28 ENCOUNTER — Encounter: Payer: Medicare HMO | Admitting: Obstetrics and Gynecology

## 2018-06-01 ENCOUNTER — Encounter (HOSPITAL_COMMUNITY): Payer: Self-pay | Admitting: Psychiatry

## 2018-06-01 ENCOUNTER — Ambulatory Visit (HOSPITAL_COMMUNITY): Payer: Medicare HMO | Admitting: Psychiatry

## 2018-06-01 VITALS — BP 115/74 | HR 94 | Ht 66.0 in | Wt 171.0 lb

## 2018-06-01 DIAGNOSIS — F4310 Post-traumatic stress disorder, unspecified: Secondary | ICD-10-CM | POA: Diagnosis not present

## 2018-06-01 DIAGNOSIS — F3175 Bipolar disorder, in partial remission, most recent episode depressed: Secondary | ICD-10-CM | POA: Diagnosis not present

## 2018-06-01 DIAGNOSIS — R69 Illness, unspecified: Secondary | ICD-10-CM | POA: Diagnosis not present

## 2018-06-01 MED ORDER — ARIPIPRAZOLE 2 MG PO TABS: 2.0000 mg | ORAL_TABLET | Freq: Every day | ORAL | 0 refills | Status: DC

## 2018-06-01 MED ORDER — ALPRAZOLAM 1 MG PO TABS: 1.0000 mg | ORAL_TABLET | Freq: Every day | ORAL | 0 refills | Status: DC | PRN

## 2018-06-01 NOTE — Patient Instructions (Signed)
1. Continue mirtazapine 7.5 mg  3. Continue Abilify 2 mg daily  4. Continue Xanax 1 mg daily as needed for anxiety 5. Return to clinic in one month for 30 mins

## 2018-06-02 ENCOUNTER — Ambulatory Visit (HOSPITAL_COMMUNITY): Payer: Self-pay | Admitting: Psychiatry

## 2018-06-03 ENCOUNTER — Other Ambulatory Visit (HOSPITAL_COMMUNITY): Payer: Self-pay | Admitting: Psychiatry

## 2018-06-08 ENCOUNTER — Other Ambulatory Visit: Payer: Self-pay | Admitting: Obstetrics and Gynecology

## 2018-06-08 ENCOUNTER — Ambulatory Visit: Payer: Medicare HMO | Admitting: Obstetrics and Gynecology

## 2018-06-08 ENCOUNTER — Encounter: Payer: Self-pay | Admitting: Obstetrics and Gynecology

## 2018-06-08 VITALS — BP 107/68 | HR 80 | Ht 66.0 in | Wt 172.4 lb

## 2018-06-08 DIAGNOSIS — N870 Mild cervical dysplasia: Secondary | ICD-10-CM | POA: Diagnosis not present

## 2018-06-08 DIAGNOSIS — R87810 Cervical high risk human papillomavirus (HPV) DNA test positive: Secondary | ICD-10-CM | POA: Diagnosis not present

## 2018-06-08 DIAGNOSIS — R87610 Atypical squamous cells of undetermined significance on cytologic smear of cervix (ASC-US): Secondary | ICD-10-CM | POA: Diagnosis not present

## 2018-06-08 DIAGNOSIS — Z3202 Encounter for pregnancy test, result negative: Secondary | ICD-10-CM | POA: Diagnosis not present

## 2018-06-08 DIAGNOSIS — R69 Illness, unspecified: Secondary | ICD-10-CM | POA: Diagnosis not present

## 2018-06-08 LAB — POCT URINE PREGNANCY: PREG TEST UR: NEGATIVE

## 2018-06-08 NOTE — Progress Notes (Signed)
Patient ID: Jane English, female   DOB: Mar 26, 1971, 47 y.o.   MRN: [id number]  [name] 47 y.o. [id number] here for colposcopy for atypical squamous cellularity of undetermined significance (ASCUS) pap smear on 04/14/18.  Discussed role for HPV in cervical dysplasia, need for surveillance.  Patient given informed consent, signed copy in the chart, time out was performed.  Placed in lithotomy position. Cervix viewed with speculum and colposcope after application of acetic acid.   Colposcopy adequate? Yes  No visible lesions  ECC specimen obtained. ECC specimen  labelled and sent to pathology.   Colposcopy IMPRESSION: No visible dysplasia  Patient was given post procedure instructions. Will follow up pathology and manage accordingly.  Routine preventative health maintenance measures emphasized.  Plan 1. Annual monitoring with pap with cotesting. 2 followup on ECC  By signing my name below, I, Samul Dada, attest that this documentation has been prepared under the direction and in the presence of Jonnie Kind, MD. Electronically Signed: [place]. 06/08/18. 1:53 PM.  I personally performed the services described in this documentation, which was SCRIBED in my presence. The recorded information has been reviewed and considered accurate. It has been edited as necessary during review. Jonnie Kind, MD

## 2018-06-15 DIAGNOSIS — M961 Postlaminectomy syndrome, not elsewhere classified: Secondary | ICD-10-CM | POA: Diagnosis not present

## 2018-06-16 ENCOUNTER — Ambulatory Visit (HOSPITAL_COMMUNITY): Payer: Medicare HMO | Admitting: Psychiatry

## 2018-06-18 ENCOUNTER — Other Ambulatory Visit: Payer: Self-pay | Admitting: Family Medicine

## 2018-06-18 ENCOUNTER — Other Ambulatory Visit (HOSPITAL_COMMUNITY): Payer: Self-pay | Admitting: Psychiatry

## 2018-06-18 MED ORDER — ARIPIPRAZOLE 2 MG PO TABS: 2.0000 mg | ORAL_TABLET | Freq: Every day | ORAL | 0 refills | Status: DC

## 2018-06-30 ENCOUNTER — Ambulatory Visit (HOSPITAL_COMMUNITY): Payer: Self-pay | Admitting: Psychiatry

## 2018-07-01 ENCOUNTER — Ambulatory Visit (HOSPITAL_COMMUNITY): Payer: Self-pay | Admitting: Psychiatry

## 2018-07-02 ENCOUNTER — Ambulatory Visit (INDEPENDENT_AMBULATORY_CARE_PROVIDER_SITE_OTHER): Payer: Medicare HMO | Admitting: Psychiatry

## 2018-07-02 DIAGNOSIS — F3175 Bipolar disorder, in partial remission, most recent episode depressed: Secondary | ICD-10-CM

## 2018-07-02 DIAGNOSIS — F4310 Post-traumatic stress disorder, unspecified: Secondary | ICD-10-CM

## 2018-07-02 DIAGNOSIS — R69 Illness, unspecified: Secondary | ICD-10-CM | POA: Diagnosis not present
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
## 2018-07-03 ENCOUNTER — Encounter

## 2018-07-03 ENCOUNTER — Ambulatory Visit (HOSPITAL_COMMUNITY): Payer: Self-pay | Admitting: Psychiatry

## 2018-07-07 ENCOUNTER — Ambulatory Visit (HOSPITAL_COMMUNITY): Payer: Self-pay | Admitting: Psychiatry

## 2018-07-14 ENCOUNTER — Ambulatory Visit (HOSPITAL_COMMUNITY): Payer: Medicare HMO | Admitting: Psychiatry

## 2018-07-14 DIAGNOSIS — R69 Illness, unspecified: Secondary | ICD-10-CM | POA: Diagnosis not present

## 2018-07-14 DIAGNOSIS — F4310 Post-traumatic stress disorder, unspecified: Secondary | ICD-10-CM | POA: Diagnosis not present

## 2018-07-14 DIAGNOSIS — F3175 Bipolar disorder, in partial remission, most recent episode depressed: Secondary | ICD-10-CM | POA: Diagnosis not present
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
## 2018-07-15 ENCOUNTER — Ambulatory Visit: Payer: Self-pay | Admitting: Family Medicine
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
## 2018-07-23 ENCOUNTER — Ambulatory Visit (HOSPITAL_COMMUNITY): Payer: Medicare HMO | Admitting: Psychiatry

## 2018-07-23 ENCOUNTER — Encounter (HOSPITAL_COMMUNITY): Payer: Self-pay | Admitting: Psychiatry

## 2018-07-23 VITALS — BP 112/70 | HR 108 | Ht 66.0 in | Wt 182.0 lb

## 2018-07-23 DIAGNOSIS — R69 Illness, unspecified: Secondary | ICD-10-CM | POA: Diagnosis not present

## 2018-07-23 DIAGNOSIS — F4310 Post-traumatic stress disorder, unspecified: Secondary | ICD-10-CM | POA: Diagnosis not present

## 2018-07-23 NOTE — Patient Instructions (Signed)
1. Discontinue mirtazapine, Abilify  2. Decrease Xanax 0.5 mg daily prn for anxiety 3. Return to clinic in one month for 30 mins

## 2018-07-28 ENCOUNTER — Ambulatory Visit (HOSPITAL_COMMUNITY): Payer: Self-pay | Admitting: Psychiatry

## 2018-07-29 ENCOUNTER — Ambulatory Visit (HOSPITAL_COMMUNITY): Payer: Self-pay | Admitting: Psychiatry

## 2018-07-30 ENCOUNTER — Ambulatory Visit (HOSPITAL_COMMUNITY): Payer: Self-pay | Admitting: Psychiatry

## 2018-08-11 ENCOUNTER — Ambulatory Visit (INDEPENDENT_AMBULATORY_CARE_PROVIDER_SITE_OTHER): Payer: Medicare HMO | Admitting: Psychiatry

## 2018-08-11 DIAGNOSIS — F4310 Post-traumatic stress disorder, unspecified: Secondary | ICD-10-CM

## 2018-08-11 DIAGNOSIS — R69 Illness, unspecified: Secondary | ICD-10-CM | POA: Diagnosis not present
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
## 2018-08-12 ENCOUNTER — Encounter (HOSPITAL_COMMUNITY): Payer: Self-pay | Admitting: Psychiatry

## 2018-08-12 ENCOUNTER — Ambulatory Visit (INDEPENDENT_AMBULATORY_CARE_PROVIDER_SITE_OTHER): Payer: Medicare HMO

## 2018-08-12 ENCOUNTER — Ambulatory Visit (INDEPENDENT_AMBULATORY_CARE_PROVIDER_SITE_OTHER): Payer: Medicare HMO | Admitting: Psychiatry

## 2018-08-12 VITALS — BP 114/75 | HR 91 | Ht 66.0 in | Wt 172.0 lb

## 2018-08-12 DIAGNOSIS — Z23 Encounter for immunization: Secondary | ICD-10-CM

## 2018-08-12 DIAGNOSIS — G4700 Insomnia, unspecified: Secondary | ICD-10-CM

## 2018-08-12 DIAGNOSIS — F419 Anxiety disorder, unspecified: Secondary | ICD-10-CM | POA: Diagnosis not present

## 2018-08-12 DIAGNOSIS — R69 Illness, unspecified: Secondary | ICD-10-CM | POA: Diagnosis not present

## 2018-08-12 DIAGNOSIS — F4310 Post-traumatic stress disorder, unspecified: Secondary | ICD-10-CM

## 2018-08-12 MED ORDER — ALPRAZOLAM 1 MG PO TABS
1.0000 mg | ORAL_TABLET | Freq: Every evening | ORAL | 0 refills | Status: DC | PRN
Start: 2018-08-14 — End: 2018-09-21

## 2018-08-12 MED ORDER — SERTRALINE HCL 50 MG PO TABS: ORAL_TABLET | ORAL | 0 refills | Status: DC

## 2018-08-12 NOTE — Patient Instructions (Signed)
1. Start sertraline 25 mg daily for one week, then 50 mg daily  2. Continue Xanax 0.5 mg daily as needed for anxiety  3. Return to clinic in one month for 30 mins
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
## 2018-08-18 ENCOUNTER — Other Ambulatory Visit: Payer: Self-pay | Admitting: Family Medicine

## 2018-08-25 ENCOUNTER — Ambulatory Visit (HOSPITAL_COMMUNITY): Payer: Self-pay | Admitting: Psychiatry

## 2018-08-27 ENCOUNTER — Ambulatory Visit (INDEPENDENT_AMBULATORY_CARE_PROVIDER_SITE_OTHER): Payer: Medicare HMO | Admitting: Family Medicine

## 2018-08-27 ENCOUNTER — Encounter: Payer: Self-pay | Admitting: Family Medicine

## 2018-08-27 VITALS — BP 110/70 | HR 86 | Resp 16 | Ht 66.0 in | Wt 166.1 lb

## 2018-08-27 DIAGNOSIS — R69 Illness, unspecified: Secondary | ICD-10-CM | POA: Diagnosis not present

## 2018-08-27 DIAGNOSIS — G894 Chronic pain syndrome: Secondary | ICD-10-CM

## 2018-08-27 DIAGNOSIS — J45991 Cough variant asthma: Secondary | ICD-10-CM

## 2018-08-27 DIAGNOSIS — F322 Major depressive disorder, single episode, severe without psychotic features: Secondary | ICD-10-CM

## 2018-08-27 DIAGNOSIS — I10 Essential (primary) hypertension: Secondary | ICD-10-CM | POA: Diagnosis not present

## 2018-08-27 DIAGNOSIS — K219 Gastro-esophageal reflux disease without esophagitis: Secondary | ICD-10-CM | POA: Diagnosis not present

## 2018-08-27 NOTE — Patient Instructions (Addendum)
F/U in 4.5 months, call if you need me before  I will send Dr a Theora Gianotti but absolutely encourage you to trust , pray and continue treatment

## 2018-08-31 ENCOUNTER — Telehealth (HOSPITAL_COMMUNITY): Payer: Self-pay | Admitting: *Deleted

## 2018-08-31 ENCOUNTER — Ambulatory Visit (INDEPENDENT_AMBULATORY_CARE_PROVIDER_SITE_OTHER): Payer: Medicare HMO | Admitting: Psychiatry

## 2018-08-31 ENCOUNTER — Encounter (HOSPITAL_COMMUNITY): Payer: Self-pay | Admitting: Psychiatry

## 2018-08-31 ENCOUNTER — Other Ambulatory Visit (HOSPITAL_COMMUNITY): Payer: Self-pay | Admitting: Psychiatry

## 2018-08-31 VITALS — BP 121/82 | HR 94 | Ht 66.0 in | Wt 166.0 lb

## 2018-08-31 DIAGNOSIS — G4700 Insomnia, unspecified: Secondary | ICD-10-CM

## 2018-08-31 DIAGNOSIS — F4310 Post-traumatic stress disorder, unspecified: Secondary | ICD-10-CM | POA: Diagnosis not present

## 2018-08-31 DIAGNOSIS — F419 Anxiety disorder, unspecified: Secondary | ICD-10-CM | POA: Diagnosis not present

## 2018-08-31 DIAGNOSIS — R69 Illness, unspecified: Secondary | ICD-10-CM | POA: Diagnosis not present

## 2018-08-31 MED ORDER — MIRTAZAPINE 7.5 MG PO TABS: 7.5000 mg | ORAL_TABLET | Freq: Every day | ORAL | 0 refills | Status: DC

## 2018-08-31 MED ORDER — ARIPIPRAZOLE 2 MG PO TABS: 2.0000 mg | ORAL_TABLET | Freq: Every day | ORAL | 0 refills | Status: DC

## 2018-08-31 NOTE — Patient Instructions (Addendum)
1. Start mirtazapine 7.5 mg at night  2. Decrease sertraline 25 mg daily for one week, then discontinue 3. Start Abilify 2 mg daily  4. Continue xanax 0.5 mg twice a day as needed for anxiety 5. Keep the appointment on November 4th

## 2018-08-31 NOTE — Telephone Encounter (Signed)
Dr Modesta Messing Patient called stating the Sertraline is making "her more depressed" than before

## 2018-08-31 NOTE — Telephone Encounter (Signed)
Ask her if she can come back sooner for evaluation. I would like to see her before making any change as it is difficult to tell if the sertraline dose is not enough, or she is experiencing any side effect. Remind her of emergency resources if she were to develop any active SI.
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
## 2018-09-02 ENCOUNTER — Other Ambulatory Visit: Payer: Self-pay | Admitting: Family Medicine

## 2018-09-02 ENCOUNTER — Other Ambulatory Visit (HOSPITAL_COMMUNITY): Payer: Self-pay | Admitting: Psychiatry
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
## 2018-09-07 ENCOUNTER — Ambulatory Visit (HOSPITAL_COMMUNITY): Payer: Medicare HMO | Admitting: Psychiatry

## 2018-09-14 ENCOUNTER — Ambulatory Visit (HOSPITAL_COMMUNITY): Payer: Medicare HMO | Admitting: Psychiatry

## 2018-09-17 ENCOUNTER — Ambulatory Visit (HOSPITAL_COMMUNITY): Payer: Self-pay | Admitting: Psychiatry

## 2018-09-21 ENCOUNTER — Telehealth (HOSPITAL_COMMUNITY): Payer: Self-pay | Admitting: Psychiatry

## 2018-09-21 ENCOUNTER — Other Ambulatory Visit (HOSPITAL_COMMUNITY): Payer: Self-pay | Admitting: Psychiatry

## 2018-09-21 ENCOUNTER — Ambulatory Visit (INDEPENDENT_AMBULATORY_CARE_PROVIDER_SITE_OTHER): Payer: Medicare HMO | Admitting: Psychiatry

## 2018-09-21 ENCOUNTER — Encounter (HOSPITAL_COMMUNITY): Payer: Self-pay | Admitting: Psychiatry

## 2018-09-21 DIAGNOSIS — F4310 Post-traumatic stress disorder, unspecified: Secondary | ICD-10-CM | POA: Diagnosis not present

## 2018-09-21 DIAGNOSIS — R69 Illness, unspecified: Secondary | ICD-10-CM | POA: Diagnosis not present

## 2018-09-21 MED ORDER — ALPRAZOLAM 1 MG PO TABS: 0.5000 mg | ORAL_TABLET | Freq: Two times a day (BID) | ORAL | 0 refills | Status: DC | PRN

## 2018-09-21 NOTE — Telephone Encounter (Signed)
DONE -- Spoke with patient & informed. Follow up appointment will be scheduled

## 2018-09-21 NOTE — Telephone Encounter (Signed)
Ordered refill for Xanax per request. Please contact the patient to make follow up appointment, and notify of no show policy. Will not plan to prescribe any refill without evaluation.
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
## 2018-09-28 ENCOUNTER — Other Ambulatory Visit (HOSPITAL_COMMUNITY): Payer: Self-pay | Admitting: Psychiatry

## 2018-09-28 ENCOUNTER — Ambulatory Visit (HOSPITAL_COMMUNITY): Payer: Medicare HMO | Admitting: Psychiatry

## 2018-09-28 MED ORDER — ARIPIPRAZOLE 2 MG PO TABS: 2.0000 mg | ORAL_TABLET | Freq: Every day | ORAL | 0 refills | Status: DC

## 2018-09-28 MED ORDER — MIRTAZAPINE 7.5 MG PO TABS: 7.5000 mg | ORAL_TABLET | Freq: Every day | ORAL | 0 refills | Status: DC

## 2018-10-03 ENCOUNTER — Encounter: Payer: Self-pay | Admitting: Family Medicine

## 2018-10-03 DIAGNOSIS — F322 Major depressive disorder, single episode, severe without psychotic features: Secondary | ICD-10-CM

## 2018-10-03 HISTORY — DX: Major depressive disorder, single episode, severe without psychotic features: F32.2

## 2018-10-03 NOTE — Assessment & Plan Note (Signed)
Controlled, no change in medication DASH diet and commitment to daily physical activity for a minimum of 30 minutes discussed and encouraged, as a part of hypertension management. The importance of attaining a healthy weight is also discussed.  BP/Weight 08/27/2018 06/08/2018 04/28/2018 04/19/2018 04/14/2018 04/01/2018 4/92/0100  Systolic BP 712 197 588 325 498 264 92  Diastolic BP 70 68 70 80 70 80 54  Wt. (Lbs) 166.12 172.4 152 158 156 160.12 161.4  BMI 26.81 27.83 24.53 25.5 25.18 25.84 26.45  Some encounter information is confidential and restricted. Go to Review Flowsheets activity to see all data.

## 2018-10-03 NOTE — Assessment & Plan Note (Signed)
Stable and controlled on current medication 

## 2018-10-03 NOTE — Assessment & Plan Note (Signed)
Controlled, no change in medication  

## 2018-10-03 NOTE — Assessment & Plan Note (Signed)
Pt encouraged to buy into treatment program

## 2018-10-03 NOTE — Progress Notes (Signed)
   AAIRA OESTREICHER     MRN: [id number]      DOB: 16-Jul-1971   HPI Jane English is here for follow up and re-evaluation of chronic medical conditions, medication management and review of any available recent lab and radiology data.  Preventive health is updated, specifically  Cancer screening and Immunization.   C/o excessive fatigue, states she is not  motivated  To do anything, denies suicidal ideation or homicidal ideation, feels that medication management is the BIG issue and needs motivation to commit to treatment Denies fever , chill, or any symptoms of infection    ROS Denies recent fever or chills. Denies sinus pressure, nasal congestion, ear pain or sore throat. Denies chest congestion, productive cough or wheezing. Denies chest pains, palpitations and leg swelling Denies abdominal pain, nausea, vomiting,diarrhea or constipation.   Denies dysuria, frequency, hesitancy or incontinence. Chronic pain mangement is adequate through Pain clinic  Denies skin break down or rash.   PE  BP 110/70   Pulse 86   Resp 16   Ht 5\' 6"  (1.676 m)   Wt 166 lb 1.9 oz (75.4 kg)   SpO2 97%   BMI 26.81 kg/m   Patient alert and oriented and in no cardiopulmonary distress.  HEENT: No facial asymmetry, EOMI,   oropharynx pink and moist.  Neck supple no JVD, no mass.  Chest: Clear to auscultation bilaterally.  CVS: S1, S2 no murmurs, no S3.Regular rate.  ABD: Soft non tender.   Ext: No edema  MS: Adequate though reduced  ROM spine, shoulders, hips and knees.  Skin: Intact, no ulcerations or rash noted.  Psych:Poor  eye contact, flat affect. Memory intact  depressed appearing.  CNS: CN 2-12 intact, power,  normal throughout.no focal deficits noted.   Assessment & Plan Essential hypertension Controlled, no change in medication DASH diet and commitment to daily physical activity for a minimum of 30 minutes discussed and encouraged, as a part of hypertension management. The importance of  attaining a healthy weight is also discussed.  BP/Weight 08/27/2018 06/08/2018 04/28/2018 04/19/2018 04/14/2018 04/01/2018 8/58/8502  Systolic BP 774 128 786 767 209 470 92  Diastolic BP 70 68 70 80 70 80 54  Wt. (Lbs) 166.12 172.4 152 158 156 160.12 161.4  BMI 26.81 27.83 24.53 25.5 25.18 25.84 26.45  Some encounter information is confidential and restricted. Go to Review Flowsheets activity to see all data.       Depression, major, single episode, severe ([place]) Pt encouraged to buy into treatment program  Chronic pain syndrome Adequately ,managed through Pain clinic  Cough variant asthma Stable and controlled on current medication  GERD (gastroesophageal reflux disease) Controlled, no change in medication

## 2018-10-03 NOTE — Assessment & Plan Note (Signed)
Adequately ,managed through Pain clinic

## 2018-10-05 ENCOUNTER — Other Ambulatory Visit: Payer: Self-pay | Admitting: Internal Medicine

## 2018-10-05 ENCOUNTER — Other Ambulatory Visit: Payer: Self-pay | Admitting: Neurology

## 2018-10-05 DIAGNOSIS — M961 Postlaminectomy syndrome, not elsewhere classified: Secondary | ICD-10-CM | POA: Diagnosis not present

## 2018-10-05 DIAGNOSIS — M1711 Unilateral primary osteoarthritis, right knee: Secondary | ICD-10-CM | POA: Diagnosis not present

## 2018-10-08 ENCOUNTER — Ambulatory Visit (HOSPITAL_COMMUNITY): Payer: Self-pay | Admitting: Psychiatry
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
## 2018-10-22 ENCOUNTER — Telehealth (HOSPITAL_COMMUNITY): Payer: Self-pay | Admitting: *Deleted

## 2018-10-22 ENCOUNTER — Ambulatory Visit (HOSPITAL_COMMUNITY): Payer: Medicare HMO | Admitting: Psychiatry

## 2018-10-22 ENCOUNTER — Other Ambulatory Visit (HOSPITAL_COMMUNITY): Payer: Self-pay | Admitting: Psychiatry

## 2018-10-22 MED ORDER — ARIPIPRAZOLE 2 MG PO TABS: 2.0000 mg | ORAL_TABLET | Freq: Every day | ORAL | 1 refills | Status: DC

## 2018-10-22 MED ORDER — ALPRAZOLAM 1 MG PO TABS: 0.5000 mg | ORAL_TABLET | Freq: Two times a day (BID) | ORAL | 1 refills | Status: DC | PRN

## 2018-10-22 MED ORDER — MIRTAZAPINE 7.5 MG PO TABS: 7.5000 mg | ORAL_TABLET | Freq: Every day | ORAL | 1 refills | Status: DC

## 2018-10-22 NOTE — Telephone Encounter (Signed)
Dr Modesta Messing Patient had to reschedule today's appoint 10/22/18 until 12/03/2018 due to a virus she has.  Patient requested refill on med's.

## 2018-10-22 NOTE — Telephone Encounter (Signed)
ordered

## 2018-10-26 ENCOUNTER — Ambulatory Visit (INDEPENDENT_AMBULATORY_CARE_PROVIDER_SITE_OTHER): Payer: Medicare HMO | Admitting: Psychiatry

## 2018-10-26 DIAGNOSIS — F4310 Post-traumatic stress disorder, unspecified: Secondary | ICD-10-CM | POA: Diagnosis not present

## 2018-10-26 DIAGNOSIS — F3175 Bipolar disorder, in partial remission, most recent episode depressed: Secondary | ICD-10-CM

## 2018-10-26 DIAGNOSIS — R69 Illness, unspecified: Secondary | ICD-10-CM | POA: Diagnosis not present
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
## 2018-11-03 ENCOUNTER — Other Ambulatory Visit: Payer: Self-pay | Admitting: Family Medicine

## 2018-11-05 ENCOUNTER — Telehealth: Payer: Self-pay | Admitting: *Deleted

## 2018-11-05 DIAGNOSIS — M25512 Pain in left shoulder: Secondary | ICD-10-CM | POA: Insufficient documentation

## 2018-11-05 DIAGNOSIS — M961 Postlaminectomy syndrome, not elsewhere classified: Secondary | ICD-10-CM | POA: Diagnosis not present

## 2018-11-05 DIAGNOSIS — G894 Chronic pain syndrome: Secondary | ICD-10-CM | POA: Diagnosis not present

## 2018-11-05 NOTE — Telephone Encounter (Signed)
Do you want her to have any labwork?

## 2018-11-05 NOTE — Telephone Encounter (Signed)
Pt called and stated she wanted to talk to the nurse about her blood pressure med and fluid pill. She said she had been cramping and she talked to the pharmacist who told her to contact her Dr as she could be losing too many electrolytes. Would like a return call.

## 2018-11-06 ENCOUNTER — Telehealth: Payer: Self-pay

## 2018-11-06 DIAGNOSIS — I10 Essential (primary) hypertension: Secondary | ICD-10-CM

## 2018-11-06 DIAGNOSIS — R252 Cramp and spasm: Secondary | ICD-10-CM

## 2018-11-06 DIAGNOSIS — R7301 Impaired fasting glucose: Secondary | ICD-10-CM

## 2018-11-06 NOTE — Telephone Encounter (Signed)
pls order non fasting cmp and EGFR magnesium and HBA1C

## 2018-11-06 NOTE — Telephone Encounter (Signed)
Labs ordered.

## 2018-11-19 ENCOUNTER — Ambulatory Visit (HOSPITAL_COMMUNITY): Payer: Medicare HMO | Admitting: Psychiatry

## 2018-11-25 ENCOUNTER — Ambulatory Visit (HOSPITAL_COMMUNITY): Payer: Medicare HMO | Admitting: Psychiatry

## 2018-11-27 ENCOUNTER — Other Ambulatory Visit: Payer: Self-pay | Admitting: Family Medicine

## 2018-11-27 DIAGNOSIS — M2550 Pain in unspecified joint: Secondary | ICD-10-CM | POA: Diagnosis not present

## 2018-11-27 DIAGNOSIS — E669 Obesity, unspecified: Secondary | ICD-10-CM | POA: Diagnosis not present

## 2018-11-27 DIAGNOSIS — J449 Chronic obstructive pulmonary disease, unspecified: Secondary | ICD-10-CM | POA: Diagnosis not present

## 2018-11-27 DIAGNOSIS — G8929 Other chronic pain: Secondary | ICD-10-CM | POA: Diagnosis not present

## 2018-11-27 DIAGNOSIS — I10 Essential (primary) hypertension: Secondary | ICD-10-CM | POA: Diagnosis not present

## 2018-11-27 DIAGNOSIS — R69 Illness, unspecified: Secondary | ICD-10-CM | POA: Diagnosis not present

## 2018-11-27 DIAGNOSIS — J309 Allergic rhinitis, unspecified: Secondary | ICD-10-CM | POA: Diagnosis not present

## 2018-11-27 DIAGNOSIS — R32 Unspecified urinary incontinence: Secondary | ICD-10-CM | POA: Diagnosis not present

## 2018-11-27 DIAGNOSIS — K219 Gastro-esophageal reflux disease without esophagitis: Secondary | ICD-10-CM | POA: Diagnosis not present
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
## 2018-12-03 ENCOUNTER — Encounter (HOSPITAL_COMMUNITY): Payer: Self-pay | Admitting: Psychiatry

## 2018-12-03 ENCOUNTER — Ambulatory Visit (INDEPENDENT_AMBULATORY_CARE_PROVIDER_SITE_OTHER): Payer: Medicare HMO | Admitting: Psychiatry

## 2018-12-03 VITALS — BP 119/79 | HR 97 | Ht 66.0 in | Wt 197.0 lb

## 2018-12-03 DIAGNOSIS — R69 Illness, unspecified: Secondary | ICD-10-CM | POA: Diagnosis not present

## 2018-12-03 DIAGNOSIS — F4310 Post-traumatic stress disorder, unspecified: Secondary | ICD-10-CM | POA: Diagnosis not present

## 2018-12-03 MED ORDER — ALPRAZOLAM 1 MG PO TABS: 0.5000 mg | ORAL_TABLET | Freq: Two times a day (BID) | ORAL | 1 refills | Status: DC | PRN

## 2018-12-03 MED ORDER — VENLAFAXINE HCL ER 75 MG PO CP24: 75.0000 mg | ORAL_CAPSULE | Freq: Every day | ORAL | 2 refills | Status: DC

## 2018-12-03 MED ORDER — ARIPIPRAZOLE 2 MG PO TABS: 2.0000 mg | ORAL_TABLET | Freq: Every day | ORAL | 2 refills | Status: DC

## 2018-12-03 MED ORDER — VENLAFAXINE HCL ER 37.5 MG PO CP24: 37.5000 mg | ORAL_CAPSULE | Freq: Every day | ORAL | 0 refills | Status: DC

## 2018-12-03 NOTE — Patient Instructions (Signed)
1. Discontinue mirtazapine 2. Start venlafaxine 37.5 mg daily for one week, then 75 mg daily  3. Continue Abilify 2 mg daily  4. Continue xanax 0.5 mg twice a day as needed for anxiety 5. Return to clinic in three months for 15 mins

## 2018-12-10 ENCOUNTER — Ambulatory Visit (HOSPITAL_COMMUNITY): Payer: Medicare HMO | Admitting: Psychiatry

## 2018-12-18 DIAGNOSIS — R252 Cramp and spasm: Secondary | ICD-10-CM | POA: Diagnosis not present

## 2018-12-18 DIAGNOSIS — I10 Essential (primary) hypertension: Secondary | ICD-10-CM | POA: Diagnosis not present

## 2018-12-18 DIAGNOSIS — R7301 Impaired fasting glucose: Secondary | ICD-10-CM | POA: Diagnosis not present

## 2018-12-19 LAB — COMPLETE METABOLIC PANEL WITH GFR
AG Ratio: 1.7 (calc) (ref 1.0–2.5)
ALT: 30 U/L — ABNORMAL HIGH (ref 6–29)
AST: 26 U/L (ref 10–35)
Albumin: 4.3 g/dL (ref 3.6–5.1)
Alkaline phosphatase (APISO): 54 U/L (ref 31–125)
BUN: 12 mg/dL (ref 7–25)
CALCIUM: 9.4 mg/dL (ref 8.6–10.2)
CO2: 29 mmol/L (ref 20–32)
Chloride: 101 mmol/L (ref 98–110)
Creat: 1.09 mg/dL (ref 0.50–1.10)
GFR, EST NON AFRICAN AMERICAN: 60 mL/min/{1.73_m2} (ref >=60)
GFR, Est African American: 69 mL/min/{1.73_m2} (ref >=60)
Globulin: 2.6 g/dL (calc) (ref 1.9–3.7)
Glucose, Bld: 95 mg/dL (ref 65–139)
Potassium: 4 mmol/L (ref 3.5–5.3)
Sodium: 138 mmol/L (ref 135–146)
Total Bilirubin: 0.5 mg/dL (ref 0.2–1.2)
Total Protein: 6.9 g/dL (ref 6.1–8.1)

## 2018-12-19 LAB — HEMOGLOBIN A1C
Hgb A1c MFr Bld: 6.2 % of total Hgb — ABNORMAL HIGH (ref <5.7)
Mean Plasma Glucose: 131 (calc)
eAG (mmol/L): 7.3 (calc)

## 2018-12-19 LAB — TSH: TSH: 1.03 mIU/L

## 2018-12-19 LAB — MAGNESIUM: Magnesium: 1.8 mg/dL (ref 1.5–2.5)

## 2018-12-22 ENCOUNTER — Telehealth: Payer: Self-pay | Admitting: Family Medicine

## 2018-12-22 NOTE — Telephone Encounter (Signed)
Pt is calling inquiring about her labs.

## 2018-12-23 ENCOUNTER — Ambulatory Visit (INDEPENDENT_AMBULATORY_CARE_PROVIDER_SITE_OTHER): Payer: Medicare HMO | Admitting: Psychiatry

## 2018-12-23 DIAGNOSIS — R69 Illness, unspecified: Secondary | ICD-10-CM | POA: Diagnosis not present

## 2018-12-23 DIAGNOSIS — F3175 Bipolar disorder, in partial remission, most recent episode depressed: Secondary | ICD-10-CM

## 2018-12-23 DIAGNOSIS — F4310 Post-traumatic stress disorder, unspecified: Secondary | ICD-10-CM | POA: Diagnosis not present

## 2018-12-23 NOTE — Telephone Encounter (Signed)
Advised patient of recent lab results with verbal understanding.   

## 2018-12-23 NOTE — Telephone Encounter (Signed)
pls advise thyroidfunction, magnesium level, kidney function and liver tests are within normal, her potassium is also good. Blood sugar hjas increased SlIGHTLY to 6.2, so needs to reduce sugar intake and increase vegetables esp in diet
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
## 2018-12-27 ENCOUNTER — Encounter (HOSPITAL_COMMUNITY): Payer: Self-pay | Admitting: Psychiatry

## 2018-12-31 ENCOUNTER — Ambulatory Visit (HOSPITAL_COMMUNITY): Payer: Medicare HMO | Admitting: Psychiatry

## 2019-01-06 NOTE — Telephone Encounter (Signed)
Note sent to nurse. 

## 2019-01-14 ENCOUNTER — Ambulatory Visit (INDEPENDENT_AMBULATORY_CARE_PROVIDER_SITE_OTHER): Payer: BC Managed Care – PPO | Admitting: Family Medicine

## 2019-01-14 ENCOUNTER — Other Ambulatory Visit: Payer: Self-pay

## 2019-01-14 ENCOUNTER — Encounter: Payer: Self-pay | Admitting: Family Medicine

## 2019-01-14 ENCOUNTER — Ambulatory Visit: Payer: Self-pay | Admitting: Family Medicine

## 2019-01-14 VITALS — BP 116/78 | HR 88 | Resp 14 | Ht 66.0 in | Wt 193.1 lb

## 2019-01-14 DIAGNOSIS — Z1231 Encounter for screening mammogram for malignant neoplasm of breast: Secondary | ICD-10-CM

## 2019-01-14 DIAGNOSIS — R69 Illness, unspecified: Secondary | ICD-10-CM | POA: Diagnosis not present

## 2019-01-14 DIAGNOSIS — I10 Essential (primary) hypertension: Secondary | ICD-10-CM

## 2019-01-14 DIAGNOSIS — E669 Obesity, unspecified: Secondary | ICD-10-CM | POA: Diagnosis not present

## 2019-01-14 DIAGNOSIS — J45991 Cough variant asthma: Secondary | ICD-10-CM | POA: Diagnosis not present

## 2019-01-14 DIAGNOSIS — E66811 Obesity, class 1: Secondary | ICD-10-CM

## 2019-01-14 DIAGNOSIS — F322 Major depressive disorder, single episode, severe without psychotic features: Secondary | ICD-10-CM

## 2019-01-14 DIAGNOSIS — G894 Chronic pain syndrome: Secondary | ICD-10-CM | POA: Diagnosis not present

## 2019-01-14 MED ORDER — PHENTERMINE HCL 37.5 MG PO TABS: 37.5000 mg | ORAL_TABLET | Freq: Every day | ORAL | 1 refills | Status: DC

## 2019-01-14 NOTE — Patient Instructions (Signed)
F/U in 3 months, call if you need me before  Please schedule mammogram at checkout  New is HALF phentermine daily, to curb appetite, need to eat on regular schedule, and drink 64 ounces water daily  It is important that you exercise regularly at least 30 minutes 5 times a week. If you develop chest pain, have severe difficulty breathing, or feel very tired, stop exercising immediately and seek medical attention   Weight loss goal is 3 to 4 pounds per month  , Think about what you will eat, plan ahead. Choose " clean, green, fresh or frozen" over canned, processed or packaged foods which are more sugary, salty and fatty. 70 to 75% of food eaten should be vegetables and fruit. Three meals at set times with snacks allowed between meals, but they must be fruit or vegetables. Aim to eat over a 12 hour period , example 7 am to 7 pm, and STOP after  your last meal of the day. Drink water,generally about 64 ounces per day, no other drink is as healthy. Fruit juice is best enjoyed in a healthy way, by EATING the fruit.

## 2019-01-16 ENCOUNTER — Encounter: Payer: Self-pay | Admitting: Family Medicine

## 2019-01-16 NOTE — Assessment & Plan Note (Signed)
Controlled and stable , no recent flares 

## 2019-01-16 NOTE — Assessment & Plan Note (Signed)
Managed by Psych and reports symptom resolution

## 2019-01-16 NOTE — Assessment & Plan Note (Signed)
Deteriorated.  Patient re-educated about  the importance of commitment to a  minimum of 150 minutes of exercise per week as able.  The importance of healthy food choices with portion control discussed, as well as eating regularly and within a 12 hour window most days. The need to choose "clean , green" food 50 to 75% of the time is discussed, as well as to make water the primary drink and set a goal of 64 ounces water daily.  Encouraged to start a food diary,  and to consider  joining a support group. Sample diet sheets offered. Goals set by the patient for the next several months.   Weight /BMI 01/14/2019 08/27/2018 06/08/2018  WEIGHT 193 lb 1.9 oz 166 lb 1.9 oz 172 lb 6.4 oz  HEIGHT 5\' 6"  5\' 6"  5\' 6"   BMI 31.17 kg/m2 26.81 kg/m2 27.83 kg/m2  Some encounter information is confidential and restricted. Go to Review Flowsheets activity to see all data.   Start phentermine half daily, weight loss goal of 4 pounds / month

## 2019-01-16 NOTE — Assessment & Plan Note (Signed)
Managed by pain clinic 

## 2019-01-16 NOTE — Assessment & Plan Note (Signed)
Controlled, no change in medication DASH diet and commitment to daily physical activity for a minimum of 30 minutes discussed and encouraged, as a part of hypertension management. The importance of attaining a healthy weight is also discussed.  BP/Weight 01/14/2019 08/27/2018 06/08/2018 04/28/2018 04/19/2018 04/14/2018 3/55/7322  Systolic BP 025 427 062 376 283 151 761  Diastolic BP 78 70 68 70 80 70 80  Wt. (Lbs) 193.12 166.12 172.4 152 158 156 160.12  BMI 31.17 26.81 27.83 24.53 25.5 25.18 25.84  Some encounter information is confidential and restricted. Go to Review Flowsheets activity to see all data.

## 2019-01-16 NOTE — Progress Notes (Signed)
Jane English     MRN: [id number]      DOB: Jan 21, 1971   HPI Jane English is here for follow up and re-evaluation of chronic medical conditions, medication management and review of any available recent lab and radiology data.  Preventive health is updated, specifically  Cancer screening and Immunization.   Questions or concerns regarding consultations or procedures which the PT has had in the interim are  Addressed.States not taking Abilify as prescribed, however still is being treated by Psychiatry, and now working part time with disabled clients, which is good for her The PT denies any adverse reactions to current medications since the last visit.    C/o excessive weight gain, and overeating, asking for appetite suppressant for this. Has already committed to lifestyle change in the past 2 weeks to help with this  ROS Denies recent fever or chills. Denies sinus pressure, nasal congestion, ear pain or sore throat. Denies chest congestion, productive cough or wheezing. Denies chest pains, palpitations and leg swelling Denies abdominal pain, nausea, vomiting,diarrhea or constipation.   Denies dysuria, frequency, hesitancy or incontinence. Denies joint pain, swelling and limitation in mobility. Denies headaches, seizures, numbness, or tingling. Denies depression, anxiety or insomnia. Denies skin break down or rash.   PE  BP 116/78   Pulse 88   Resp 14   Ht 5\' 6"  (1.676 m)   Wt 193 lb 1.9 oz (87.6 kg)   SpO2 95% Comment: room air  BMI 31.17 kg/m   Patient alert and oriented and in no cardiopulmonary distress.  HEENT: No facial asymmetry, EOMI,   oropharynx pink and moist.  Neck supple no JVD, no mass.  Chest: Clear to auscultation bilaterally.  CVS: S1, S2 no murmurs, no S3.Regular rate.  ABD: Soft non tender.   Ext: No edema  MS: Adequate ROM spine, shoulders, hips and knees.  Skin: Intact, no ulcerations or rash noted.  Psych: Good eye contact, normal affect. Memory  intact not anxious or depressed appearing.  CNS: CN 2-12 intact, power,  normal throughout.no focal deficits noted.   Assessment & Plan  Essential hypertension Controlled, no change in medication DASH diet and commitment to daily physical activity for a minimum of 30 minutes discussed and encouraged, as a part of hypertension management. The importance of attaining a healthy weight is also discussed.  BP/Weight 01/14/2019 08/27/2018 06/08/2018 04/28/2018 04/19/2018 04/14/2018 7/85/8850  Systolic BP 277 412 878 676 720 947 096  Diastolic BP 78 70 68 70 80 70 80  Wt. (Lbs) 193.12 166.12 172.4 152 158 156 160.12  BMI 31.17 26.81 27.83 24.53 25.5 25.18 25.84  Some encounter information is confidential and restricted. Go to Review Flowsheets activity to see all data.       Obesity (BMI 30.0-34.9) Deteriorated.  Patient re-educated about  the importance of commitment to a  minimum of 150 minutes of exercise per week as able.  The importance of healthy food choices with portion control discussed, as well as eating regularly and within a 12 hour window most days. The need to choose "clean , green" food 50 to 75% of the time is discussed, as well as to make water the primary drink and set a goal of 64 ounces water daily.  Encouraged to start a food diary,  and to consider  joining a support group. Sample diet sheets offered. Goals set by the patient for the next several months.   Weight /BMI 01/14/2019 08/27/2018 06/08/2018  WEIGHT 193 lb 1.9 oz 166  lb 1.9 oz 172 lb 6.4 oz  HEIGHT 5\' 6"  5\' 6"  5\' 6"   BMI 31.17 kg/m2 26.81 kg/m2 27.83 kg/m2  Some encounter information is confidential and restricted. Go to Review Flowsheets activity to see all data.   Start phentermine half daily, weight loss goal of 4 pounds / month   Chronic pain syndrome Managed by pain clinic  Cough variant asthma Controlled and stab;le, no recent flares  Depression, major, single episode, severe ([place]) Managed by  Psych and reports symptom resolution

## 2019-01-25 ENCOUNTER — Ambulatory Visit (HOSPITAL_COMMUNITY): Payer: Self-pay

## 2019-01-28 ENCOUNTER — Other Ambulatory Visit: Payer: Self-pay

## 2019-01-28 ENCOUNTER — Ambulatory Visit (INDEPENDENT_AMBULATORY_CARE_PROVIDER_SITE_OTHER): Payer: Medicare HMO | Admitting: Psychiatry

## 2019-01-28 DIAGNOSIS — F3175 Bipolar disorder, in partial remission, most recent episode depressed: Secondary | ICD-10-CM | POA: Diagnosis not present

## 2019-01-28 DIAGNOSIS — R69 Illness, unspecified: Secondary | ICD-10-CM | POA: Diagnosis not present

## 2019-01-28 DIAGNOSIS — Z630 Problems in relationship with spouse or partner: Secondary | ICD-10-CM | POA: Diagnosis not present

## 2019-01-28 DIAGNOSIS — Z62810 Personal history of physical and sexual abuse in childhood: Secondary | ICD-10-CM | POA: Diagnosis not present

## 2019-01-28 DIAGNOSIS — F4310 Post-traumatic stress disorder, unspecified: Secondary | ICD-10-CM
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
## 2019-02-04 ENCOUNTER — Telehealth: Payer: Self-pay | Admitting: *Deleted

## 2019-02-04 ENCOUNTER — Emergency Department (HOSPITAL_COMMUNITY)
Admission: EM | Admit: 2019-02-04 | Discharge: 2019-02-04 | Disposition: A | Discharge status: Home / Self Care | Payer: Medicare HMO | Attending: Emergency Medicine | Admitting: Emergency Medicine

## 2019-02-04 ENCOUNTER — Emergency Department (HOSPITAL_COMMUNITY): Payer: Medicare HMO

## 2019-02-04 ENCOUNTER — Other Ambulatory Visit: Payer: Self-pay

## 2019-02-04 DIAGNOSIS — J45909 Unspecified asthma, uncomplicated: Secondary | ICD-10-CM | POA: Diagnosis not present

## 2019-02-04 DIAGNOSIS — Z91040 Latex allergy status: Secondary | ICD-10-CM | POA: Insufficient documentation

## 2019-02-04 DIAGNOSIS — Z79899 Other long term (current) drug therapy: Secondary | ICD-10-CM | POA: Insufficient documentation

## 2019-02-04 DIAGNOSIS — E119 Type 2 diabetes mellitus without complications: Secondary | ICD-10-CM | POA: Diagnosis not present

## 2019-02-04 DIAGNOSIS — I10 Essential (primary) hypertension: Secondary | ICD-10-CM | POA: Insufficient documentation

## 2019-02-04 DIAGNOSIS — R9431 Abnormal electrocardiogram [ECG] [EKG]: Secondary | ICD-10-CM | POA: Diagnosis not present

## 2019-02-04 DIAGNOSIS — R51 Headache: Secondary | ICD-10-CM | POA: Diagnosis present

## 2019-02-04 DIAGNOSIS — R0602 Shortness of breath: Secondary | ICD-10-CM | POA: Diagnosis not present

## 2019-02-04 DIAGNOSIS — R05 Cough: Secondary | ICD-10-CM | POA: Diagnosis not present

## 2019-02-04 DIAGNOSIS — B349 Viral infection, unspecified: Secondary | ICD-10-CM | POA: Diagnosis not present

## 2019-02-04 LAB — I-STAT BETA HCG BLOOD, ED (MC, WL, AP ONLY): I-stat hCG, quantitative: <5 m[IU]/mL (ref <5)

## 2019-02-04 LAB — CBC WITH DIFFERENTIAL/PLATELET
Abs Immature Granulocytes: 0.01 10*3/uL (ref 0.00–0.07)
Basophils Absolute: 0 10*3/uL (ref 0.0–0.1)
Basophils Relative: 1 %
Eosinophils Absolute: 0.1 10*3/uL (ref 0.0–0.5)
Eosinophils Relative: 2 %
HCT: 41.3 % (ref 36.0–46.0)
Hemoglobin: 13.4 g/dL (ref 12.0–15.0)
Immature Granulocytes: 0 %
Lymphocytes Relative: 33 %
Lymphs Abs: 2.1 10*3/uL (ref 0.7–4.0)
MCH: 30.3 pg (ref 26.0–34.0)
MCHC: 32.4 g/dL (ref 30.0–36.0)
MCV: 93.4 fL (ref 80.0–100.0)
Monocytes Absolute: 0.5 10*3/uL (ref 0.1–1.0)
Monocytes Relative: 8 %
Neutro Abs: 3.5 10*3/uL (ref 1.7–7.7)
Neutrophils Relative %: 56 %
Platelets: 252 10*3/uL (ref 150–400)
RBC: 4.42 MIL/uL (ref 3.87–5.11)
RDW: 11.9 % (ref 11.5–15.5)
WBC: 6.3 10*3/uL (ref 4.0–10.5)
nRBC: 0 % (ref 0.0–0.2)

## 2019-02-04 LAB — COMPREHENSIVE METABOLIC PANEL
ALT: 39 U/L (ref 0–44)
AST: 25 U/L (ref 15–41)
Albumin: 4.1 g/dL (ref 3.5–5.0)
Alkaline Phosphatase: 48 U/L (ref 38–126)
Anion gap: 9 (ref 5–15)
BUN: 10 mg/dL (ref 6–20)
CO2: 25 mmol/L (ref 22–32)
Calcium: 9.3 mg/dL (ref 8.9–10.3)
Chloride: 102 mmol/L (ref 98–111)
Creatinine, Ser: 1.06 mg/dL — ABNORMAL HIGH (ref 0.44–1.00)
GFR calc Af Amer: >60 mL/min (ref >60)
GFR calc non Af Amer: >60 mL/min (ref >60)
Glucose, Bld: 84 mg/dL (ref 70–99)
Potassium: 3.4 mmol/L — ABNORMAL LOW (ref 3.5–5.1)
Sodium: 136 mmol/L (ref 135–145)
Total Bilirubin: 0.6 mg/dL (ref 0.3–1.2)
Total Protein: 7.3 g/dL (ref 6.5–8.1)

## 2019-02-04 LAB — TROPONIN I: Troponin I: <0.03 ng/mL (ref <0.03)

## 2019-02-04 MED ORDER — PREDNISONE 10 MG (21) PO TBPK: ORAL_TABLET | ORAL | 0 refills | Status: DC

## 2019-02-04 MED ORDER — ACETAMINOPHEN 325 MG PO TABS
650.0000 mg | ORAL_TABLET | Freq: Once | ORAL | Status: AC
  Administered 2019-02-04: 650 mg via ORAL
  Filled 2019-02-04: qty 2

## 2019-02-04 NOTE — Discharge Instructions (Signed)
Your symptoms are likely consistent with a viral illness. Viruses do not require or respond to antibiotics. Treatment is symptomatic care and it is important to note that these symptoms may last for 7-14 days.   Hand washing: Wash your hands throughout the day, but especially before and after touching the face, using the restroom, sneezing, coughing, or touching surfaces that have been coughed or sneezed upon. Hydration: Symptoms of most illnesses will be intensified and complicated by dehydration. Dehydration can also extend the duration of symptoms. Drink plenty of fluids and get plenty of rest. You should be drinking at least half a liter of water an hour to stay hydrated. Electrolyte drinks (ex. Gatorade, Powerade, Pedialyte) are also encouraged. You should be drinking enough fluids to make your urine light yellow, almost clear. If this is not the case, you are not drinking enough water. Please note that some of the treatments indicated below will not be effective if you are not adequately hydrated. Pain or fever: Ibuprofen, Naproxen, or acetaminophen (generic for Tylenol) for pain or fever.  Antiinflammatory medications: Take 600 mg of ibuprofen every 6 hours or 440 mg (over the counter dose) to 500 mg (prescription dose) of naproxen every 12 hours for the next 3 days. After this time, these medications may be used as needed for pain. Take these medications with food to avoid upset stomach. Choose only one of these medications, do not take them together. Acetaminophen (generic for Tylenol): Should you continue to have additional pain while taking the ibuprofen or naproxen, you may add in acetaminophen as needed. Your daily total maximum amount of acetaminophen from all sources should be limited to 4000mg /day for persons without liver problems, or 2000mg /day for those with liver problems. Prednisone: Take the prednisone, as directed, in its entirety. Zyrtec or Claritin: May add these medication daily to  control underlying symptoms of congestion, sneezing, and other signs of allergies.  These medications are available over-the-counter. Generics: Cetirizine (generic for Zyrtec) and loratadine (generic for Claritin). Fluticasone: Use fluticasone (generic for Flonase), as directed, for nasal and sinus congestion.  This medication is available over-the-counter. Congestion: Plain guaifenesin (generic for plain Mucinex) may help relieve congestion. Saline sinus rinses and saline nasal sprays may also help relieve congestion.  Sore throat: Warm liquids or Chloraseptic spray may help soothe a sore throat. Gargle twice a day with a salt water solution made from a half teaspoon of salt in a cup of warm water.  Follow up: Follow up with a primary care provider within the next two weeks should symptoms fail to resolve. Return: Return to the ED for significantly worsening symptoms, shortness of breath, persistent vomiting, large amounts of blood in stool, or any other major concerns.  For prescription assistance, may try using prescription discount sites or apps, such as goodrx.com

## 2019-02-04 NOTE — ED Notes (Signed)
Patient verbalizes understanding of discharge instructions. Opportunity for questioning and answers were provided. Armband removed by staff, pt discharged from ED.  

## 2019-02-04 NOTE — Telephone Encounter (Signed)
Avons daughter called in a panic stating that [place] was having chest pains light headedness wanted to know what she should do. Advised her ER was where she needed to go. Stated she was going and hung up.

## 2019-02-04 NOTE — ED Provider Notes (Signed)
[facility]ER[facility] DEPARTMENT Provider Note   CSN: [id number] Arrival date & time: 02/04/19  1317    History   Chief Complaint Chief Complaint  Patient presents with  . Headache  . Sore Throat    HPI Jane English is a 48 y.o. female.     HPI   Jane English is a 48 y.o. female, with a history of asthma, DM, GERD, DVT, HTN, presenting to the ED with shortness of breath for the last three days. Mild intermittently productive cough. Intermittent upper chest burning, mild. Nausea. Accompanied by throat pain, burning, central, nonradiating. Lightheadedness with standing today.  Headache, global, pressure, 8/10, nonradiating. Has not tried any therapies for her symptoms. Denies contact with known or suspected COVID-19 patients. Denies fever, vomiting, diarrhea, syncope, abdominal pain, neuro deficits, diaphoresis, lower extremity edema/pain, or any other complaints.    Past Medical History:  Diagnosis Date  . Anemia   . Asthma   . Asthma flare 04/09/2013  . Back pain   . Bronchitis   . Chronic abdominal pain   . Chronic constipation   . Constipation due to opioid therapy   . Depression   . Diabetes mellitus without complication ([place])   . Diabetes mellitus, type II ([place])   . DVT (deep venous thrombosis) ([place]) 2010  . GERD (gastroesophageal reflux disease)   . Heart murmur    no cardiologist  . Helicobacter pylori gastritis 06/11/2013   Colonoscopy Dr. Hilarie Fredrickson  . Hypertension   . IBS (irritable bowel syndrome)   . Migraine headache   . Neuropathy   . Obesity   . Obsessive-compulsive disorder   . PSYCHOTIC D/O W/HALLUCINATIONS CONDS CLASS ELSW 03/04/2010   Qualifier: Diagnosis of  By: Moshe Cipro MD, Joycelyn Schmid    . PTSD (post-traumatic stress disorder)   . SBO (small bowel obstruction) ([place]) 08/09/2013  . Seasonal allergies 12/10/2012  . Seizures ([place])   . Shortness of breath     Patient Active Problem List   Diagnosis Date Noted  . Depression, major,  single episode, severe ([place]) 10/03/2018  . Vitamin D deficiency 12/14/2017  . Bilateral ankle pain 04/16/2017  . Amenorrhea 10/15/2016  . Abnormal vaginal Pap smear 10/14/2016  . Paresthesia 05/06/2016  . Back pain with radiation 04/04/2016  . Obesity (BMI 30.0-34.9) 03/25/2016  . Neuropathic pain of finger of right hand 12/31/2015  . Pap smear of cervix shows high risk HPV present 08/08/2015  . Cyst of left ovary 02/06/2015  . Anovulation 02/06/2015  . Secondary amenorrhea 02/06/2015  . Intractable chronic migraine without aura and without status migrainosus 12/21/2014  . Migraine 09/21/2014  . Anemia 10/11/2013  . H. pylori duodenitis 07/07/2013  . PTSD (post-traumatic stress disorder) 03/26/2013  . Insomnia 03/18/2013  . Seasonal allergies 12/10/2012  . GERD (gastroesophageal reflux disease) 10/29/2012  . Chronic pain syndrome 10/06/2012  . Postlaminectomy syndrome 10/06/2012  . Cervical neck pain with evidence of disc disease 04/13/2012  . Allergic rhinitis 04/17/2010  . Cough variant asthma 03/04/2010  . Prediabetes 01/05/2009  . Essential hypertension 11/27/2007    Past Surgical History:  Procedure Laterality Date  . ANTERIOR CERVICAL DECOMP/DISCECTOMY FUSION  07/07/2012   Procedure: ANTERIOR CERVICAL DECOMPRESSION/DISCECTOMY FUSION 2 LEVELS;  Surgeon: Floyce Stakes, MD;  Location: [facility];  Service: Neurosurgery;  Laterality: N/A;  Cervical four-five, five - six  Anterior cervical decompression/diskectomy/fusion/plate  . APPENDECTOMY  1986  . BOWEL RESECTION N/A 07/29/2013   Procedure: serosal repair;  Surgeon: Revonda Standard.  Gross, MD;  Location: [facility];  Service: General;  Laterality: N/A;  . CARPAL TUNNEL RELEASE Bilateral   . LAPAROSCOPY N/A 07/29/2013   Procedure: diagnostic laporoscopy;  Surgeon: Adin Hector, MD;  Location: [facility];  Service: General;  Laterality: N/A;  . LAPAROSCOPY N/A 08/16/2013   Procedure: LAPAROSCOPY DIAGNOSTIC/LYSIS OF ADHESIONS;   Surgeon: Adin Hector, MD;  Location: [facility];  Service: General;  Laterality: N/A;  . LAPAROTOMY N/A 08/16/2013   Procedure: EXPLORATORY LAPAROTOMY/SMALL BOWEL RESECTION (JEJUNUM);  Surgeon: Adin Hector, MD;  Location: [facility];  Service: General;  Laterality: N/A;  . LUMBAR SPINE SURGERY  2010   x 3  . LYSIS OF ADHESION  2003   Dr. Irving Shows  . LYSIS OF ADHESION N/A 07/29/2013   Procedure: LYSIS OF ADHESION;  Surgeon: Adin Hector, MD;  Location: [facility];  Service: General;  Laterality: N/A;  . OOPHORECTOMY    . PARTIAL HYSTERECTOMY  1990s?   [place], [place]  . SPINAL CORD STIMULATOR IMPLANT    . TRIGGER FINGER RELEASE  2009   right pinkie finger  . TUBAL LIGATION  1994     OB History    Gravida  5   Para  4   Term  4   Preterm      AB  1   Living  4     SAB  1   TAB      Ectopic      Multiple      Live Births  4            Home Medications    Prior to Admission medications   Medication Sig Start Date End Date Taking? Authorizing Provider  ALPRAZolam Duanne Moron) 1 MG tablet Take 0.5 tablets (0.5 mg total) by mouth 2 (two) times daily as needed for anxiety. 12/31/18  Yes Hisada, Elie Goody, MD  ARIPiprazole (ABILIFY) 2 MG tablet Take 1 tablet (2 mg total) by mouth daily. Patient taking differently: Take 2 mg by mouth at bedtime.  12/22/18  Yes Hisada, Elie Goody, MD  Beclomethasone Diprop HFA (QVAR REDIHALER) 40 MCG/ACT AERB Inhale 2 puffs into the lungs 2 (two) times daily. 01/07/17  Yes Fayrene Helper, MD  cloNIDine (CATAPRES) 0.1 MG tablet TAKE 1 TABLET BY MOUTH EVERYDAY AT BEDTIME Patient taking differently: Take 0.1 mg by mouth at bedtime.  09/03/18  Yes Fayrene Helper, MD  linaclotide [facility]) 290 MCG CAPS capsule Take 1 capsule (290 mcg total) by mouth daily before breakfast. Patient taking differently: Take 290 mcg by mouth at bedtime.  03/31/18  Yes Pyrtle, Lajuan Lines, MD  loratadine (CLARITIN) 10 MG tablet TAKE 1 TABLET BY MOUTH EVERY DAY Patient  taking differently: Take 10 mg by mouth daily as needed for allergies.  11/30/18  Yes Fayrene Helper, MD  lubiprostone (AMITIZA) 24 MCG capsule Take 1 capsule (24 mcg total) by mouth 2 (two) times daily with a meal. Patient taking differently: Take 24 mcg by mouth 2 (two) times daily.  03/31/18  Yes Pyrtle, Lajuan Lines, MD  montelukast (SINGULAIR) 10 MG tablet TAKE 1 TABLET BY MOUTH EVERYDAY AT BEDTIME Patient taking differently: Take 10 mg by mouth at bedtime.  05/25/18  Yes Fayrene Helper, MD  NARCAN 4 MG/0.1ML LIQD nasal spray kit Place 4 mg into the nose as directed. 03/10/18  Yes [provider]  pantoprazole (PROTONIX) 40 MG tablet TAKE 1 TABLET BY MOUTH EVERY DAY Patient taking differently: Take 40 mg by  mouth at bedtime.  10/06/18  Yes Pyrtle, Lajuan Lines, MD  phentermine (ADIPEX-P) 37.5 MG tablet Take 1 tablet (37.5 mg total) by mouth daily before breakfast. 01/14/19  Yes Fayrene Helper, MD  polyethylene glycol powder (GLYCOLAX/MIRALAX) powder Take 17 grams (1 capful) dissolved in at least 8 ounces water juice and drink 3 times daily 03/31/18  Yes Pyrtle, Lajuan Lines, MD  spironolactone (ALDACTONE) 25 MG tablet TAKE 1 TABLET BY MOUTH EVERY DAY Patient taking differently: Take 25 mg by mouth at bedtime.  09/03/18  Yes Fayrene Helper, MD  triamterene-hydrochlorothiazide (INOMVEH-20) 37.5-25 MG tablet TAKE 1 TABLET BY MOUTH EVERY DAY Patient taking differently: Take 0.5 tablets by mouth at bedtime.  11/03/18  Yes Fayrene Helper, MD  Vitamin D, Ergocalciferol, (DRISDOL) 50000 units CAPS capsule TAKE 1 CAPSULE (50,000 UNITS TOTAL) BY MOUTH ONCE A WEEK. Patient taking differently: Take 50,000 Units by mouth every 7 (seven) days.  03/12/18  Yes Fayrene Helper, MD  albuterol (PROVENTIL) (2.5 MG/3ML) 0.083% nebulizer solution Take 3 mLs (2.5 mg total) by nebulization every 6 (six) hours as needed for wheezing or shortness of breath. Patient not taking: Reported on 02/04/2019 10/30/17    Fayrene Helper, MD  Oxycodone HCl 10 MG TABS Take 10 mg by mouth every 12 (twelve) hours as needed (for pain).     [provider]  potassium chloride 20 MEQ/15ML (10%) SOLN Take 15 mLs (20 mEq total) by mouth 2 (two) times daily. Patient not taking: Reported on 02/04/2019 04/17/18   Fayrene Helper, MD  predniSONE (STERAPRED UNI-PAK 21 TAB) 10 MG (21) TBPK tablet Take 6 tabs (9m) day 1, 5 tabs (512m day 2, 4 tabs (4071mday 3, 3 tabs (14m53may 4, 2 tabs (20mg68my 5, and 1 tab (10mg)33m 6. 02/04/19   Chastin Riesgo C, PA-C  topiramate (TOPAMAX) 50 MG tablet Take 2 tablets (100 mg total) by mouth 2 (two) times daily. Patient not taking: Reported on 02/04/2019 07/08/17   Yan, YMarcial Pacas  Family History Family History  Problem Relation Age of Onset  . Lung cancer Father   . Stomach cancer Father   . Esophageal cancer Father   . Alcohol abuse Father   . Mental illness Father   . Diabetes Sister   . Hypertension Sister   . Bipolar disorder Sister   . Schizophrenia Sister   . Diabetes Sister   . Alcohol abuse Brother   . Hypertension Brother   . Kidney disease Brother   . Diabetes Brother   . Drug abuse Brother   . Mental illness Brother   . Alcohol abuse Brother   . Alcohol abuse Brother   . Hypertension Brother   . Diabetes Brother   . Alcohol abuse Brother   . Physical abuse Mother   . Alcohol abuse Mother   . Cirrhosis Mother   . Mental illness Brother        [place]   . HIV Sister   . Alcohol abuse Brother   . Pneumonia Sister        died as a baby  . Alcohol abuse Brother   . Bipolar disorder Brother   . Bipolar disorder Daughter   . Bipolar disorder Son   . Bipolar disorder Son   . Hypertension Brother   . Bipolar disorder Brother   . Drug abuse Brother   . Alcohol abuse Brother   . Bipolar disorder Brother   .  ADD / ADHD Neg Hx   . Anxiety disorder Neg Hx   . Dementia Neg Hx   . Depression Neg Hx   . OCD Neg Hx   . Seizures  Neg Hx   . Paranoid behavior Neg Hx   . Colon cancer Neg Hx     Social History Social History   Tobacco Use  . Smoking status: Never Smoker  . Smokeless tobacco: Never Used  Substance Use Topics  . Alcohol use: No  . Drug use: No     Allergies   Neurontin [gabapentin]; Penicillins; Pregabalin; Tramadol; and Latex   Review of Systems Review of Systems  Constitutional: Negative for chills, diaphoresis and fever.  HENT: Positive for sore throat. Negative for trouble swallowing and voice change.   Respiratory: Positive for cough and shortness of breath.   Cardiovascular: Negative for leg swelling.  Gastrointestinal: Positive for nausea. Negative for abdominal pain, constipation, diarrhea and vomiting.  Genitourinary: Negative for dysuria, frequency and hematuria.  Musculoskeletal: Negative for back pain, neck pain and neck stiffness.  Neurological: Positive for light-headedness and headaches. Negative for syncope, weakness and numbness.  Psychiatric/Behavioral: Negative for confusion.  All other systems reviewed and are negative.    Physical Exam Updated Vital Signs BP (!) 124/91   Pulse 96   Temp 98.4 F (36.9 C) (Oral)   Resp 16   SpO2 98%   Physical Exam Vitals signs and nursing note reviewed.  Constitutional:      General: She is not in acute distress.    Appearance: She is well-developed. She is not diaphoretic.  HENT:     Head: Normocephalic and atraumatic.     Nose: Nose normal.     Mouth/Throat:     Mouth: Mucous membranes are moist.     Pharynx: Oropharynx is clear.  Eyes:     Conjunctiva/sclera: Conjunctivae normal.     Pupils: Pupils are equal, round, and reactive to light.  Neck:     Musculoskeletal: Neck supple.  Cardiovascular:     Rate and Rhythm: Normal rate and regular rhythm.     Pulses: Normal pulses.     Heart sounds: Normal heart sounds.     Comments: Tactile temperature in the extremities appropriate and equal bilaterally. Pulmonary:      Effort: Pulmonary effort is normal. No respiratory distress.     Breath sounds: Normal breath sounds.     Comments: No increased work of breathing.  Speaks in full sentences without difficulty. Abdominal:     Palpations: Abdomen is soft.     Tenderness: There is no abdominal tenderness. There is no guarding.  Musculoskeletal:     Right lower leg: No edema.     Left lower leg: No edema.  Lymphadenopathy:     Cervical: No cervical adenopathy.  Skin:    General: Skin is warm and dry.  Neurological:     Mental Status: She is alert and oriented to person, place, and time.     Comments: Sensation grossly intact to light touch in the extremities.  Grip strengths equal bilaterally.  Strength 5/5 in all extremities. No gait disturbance. Coordination intact. Cranial nerves III-XII grossly intact. No facial droop.   Psychiatric:        Mood and Affect: Mood and affect normal.        Speech: Speech normal.        Behavior: Behavior normal.      ED Treatments / Results  Labs (all labs ordered are listed,  but only abnormal results are displayed) Labs Reviewed  COMPREHENSIVE METABOLIC PANEL - Abnormal; Notable for the following components:      Result Value   Potassium 3.4 (*)    Creatinine, Ser 1.06 (*)    All other components within normal limits  TROPONIN I  CBC WITH DIFFERENTIAL/PLATELET  I-STAT BETA HCG BLOOD, ED ([facility], [facility], [facility] ONLY)   BUN  Date Value Ref Range Status  02/04/2019 10 6 - 20 mg/dL Final  12/18/2018 12 7 - 25 mg/dL Final  04/28/2018 16 7 - 25 mg/dL Final  04/19/2018 13 6 - 20 mg/dL Final   Creat  Date Value Ref Range Status  12/18/2018 1.09 0.50 - 1.10 mg/dL Final  04/28/2018 1.26 (H) 0.50 - 1.10 mg/dL Final  12/08/2017 1.14 (H) 0.50 - 1.10 mg/dL Final  06/09/2017 0.99 0.50 - 1.10 mg/dL Final   Creatinine, Ser  Date Value Ref Range Status  02/04/2019 1.06 (H) 0.44 - 1.00 mg/dL Final  04/19/2018 1.18 (H) 0.44 - 1.00 mg/dL Final     EKG EKG  Interpretation  Date/Time:  Thursday February 04 2019 13:28:20 EDT Ventricular Rate:  89 PR Interval:    QRS Duration: 84 QT Interval:  360 QTC Calculation: 438 R Axis:   60 Text Interpretation:  Sinus rhythm Consider left ventricular hypertrophy Confirmed by Davonna Belling [phone]) on 02/04/2019 1:32:27 PM   Radiology Dg Chest 2 View  Result Date: 02/04/2019 CLINICAL DATA:  Shortness of breath, productive cough EXAM: CHEST - 2 VIEW COMPARISON:  04/19/2018 FINDINGS: The heart size and mediastinal contours are within normal limits. Both lungs are clear. The visualized skeletal structures are unremarkable. Lower cervical fusion hardware noted.  Thoracic stimulator present. Trachea is midline. IMPRESSION: No active cardiopulmonary disease. Electronically Signed   By: Jerilynn Mages.  Shick M.D.   On: 02/04/2019 14:18    Procedures Procedures (including critical care time)  Medications Ordered in ED Medications  acetaminophen (TYLENOL) tablet 650 mg (650 mg Oral Given 02/04/19 1343)     Initial Impression / Assessment and Plan / ED Course  I have reviewed the triage vital signs and the nursing notes.  Pertinent labs & imaging results that were available during my care of the patient were reviewed by me and considered in my medical decision making (see chart for details).        Patient presents with sore throat, cough, shortness of breath. Patient is nontoxic appearing, afebrile, not tachycardic, not tachypneic, not hypotensive, maintains excellent SPO2 on room air, and is in no apparent distress. PERC negative.  I discussed other interventions and options for treatment of her symptoms here in the ED, including IV fluids.  Patient opted to instead "just go home and rest" after labs returned. Labs are overall reassuring.  No leukocytosis.  Troponin negative.  Due to duration of constant symptoms, a single troponin was thought to be sufficient.  No acute abnormality on chest x-ray.  No acute  abnormalities on EKG. No shortness of breath, chest pain, or onset of other symptoms with ambulation.  The patient was given instructions for home care as well as return precautions. Patient voices understanding of these instructions, accepts the plan, and is comfortable with discharge.  Vitals:   02/04/19 1330 02/04/19 1344 02/04/19 1345 02/04/19 1531  BP: (!) 124/91 (!) 124/91 124/74 117/69  Pulse: 94 96 84 77  Resp: '16 16 18 20  ' Temp:  98.4 F (36.9 C)  98.4 F (36.9 C)  TempSrc:  Oral  Oral  SpO2: 100% 98% 100% 100%     Final Clinical Impressions(s) / ED Diagnoses   Final diagnoses:  Viral syndrome    ED Discharge Orders         Ordered    predniSONE (STERAPRED UNI-PAK 21 TAB) 10 MG (21) TBPK tablet     02/04/19 1539           Lorayne Bender, PA-C 02/04/19 1623    Davonna Belling, MD 02/05/19 1133

## 2019-02-04 NOTE — Telephone Encounter (Signed)
I tried to call back but no answer. Note states daughter was taking her to the ER but next note says patient refused to go.

## 2019-02-04 NOTE — Telephone Encounter (Signed)
2 day h/o chest pain and shortness of breath,new headache denies palpitations, denies fever, chills,sore throat, does not feel well, husband is with her. She agreed for me to speak with him, she agreed that she will go to the ED for evaluation today, has not gone yet because she is afraid, I explained that she needed emergency eval , she and her husband verify that she will be going to [facility] ED today

## 2019-02-04 NOTE — Telephone Encounter (Signed)
Pts daughter called back and stated the pain was in the back of the patients head she was having pain when she breathes and stated her mother did not want to go to ER advisied the pts daughter again [place] ER also let her know that I was in touch with the nurse but she did not need to wait to go to ER

## 2019-02-10 ENCOUNTER — Ambulatory Visit (HOSPITAL_COMMUNITY): Payer: Medicare HMO | Admitting: Psychiatry

## 2019-02-19 ENCOUNTER — Other Ambulatory Visit: Payer: Self-pay | Admitting: Family Medicine

## 2019-02-19 DIAGNOSIS — R69 Illness, unspecified: Secondary | ICD-10-CM | POA: Diagnosis not present

## 2019-02-19 MED ORDER — ALBUTEROL SULFATE (2.5 MG/3ML) 0.083% IN NEBU: 2.5000 mg | INHALATION_SOLUTION | Freq: Four times a day (QID) | RESPIRATORY_TRACT | 1 refills | Status: DC | PRN

## 2019-02-19 MED ORDER — AZELASTINE HCL 0.1 % NA SOLN: 2.0000 | Freq: Two times a day (BID) | NASAL | 12 refills | Status: DC

## 2019-02-19 MED ORDER — ALBUTEROL SULFATE HFA 108 (90 BASE) MCG/ACT IN AERS: 2.0000 | INHALATION_SPRAY | Freq: Four times a day (QID) | RESPIRATORY_TRACT | 2 refills | Status: DC | PRN

## 2019-02-19 MED ORDER — TRIAMTERENE-HCTZ 37.5-25 MG PO TABS: ORAL_TABLET | ORAL | 3 refills | Status: DC

## 2019-02-24 ENCOUNTER — Ambulatory Visit (HOSPITAL_COMMUNITY): Payer: Medicare HMO | Admitting: Psychiatry
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
## 2019-03-01 ENCOUNTER — Ambulatory Visit (HOSPITAL_COMMUNITY): Payer: Self-pay

## 2019-03-04 ENCOUNTER — Other Ambulatory Visit: Payer: Self-pay

## 2019-03-04 ENCOUNTER — Ambulatory Visit (INDEPENDENT_AMBULATORY_CARE_PROVIDER_SITE_OTHER): Payer: Medicare HMO | Admitting: Psychiatry

## 2019-03-04 ENCOUNTER — Encounter (HOSPITAL_COMMUNITY): Payer: Self-pay | Admitting: Psychiatry

## 2019-03-04 DIAGNOSIS — R69 Illness, unspecified: Secondary | ICD-10-CM | POA: Diagnosis not present

## 2019-03-04 DIAGNOSIS — F4310 Post-traumatic stress disorder, unspecified: Secondary | ICD-10-CM | POA: Diagnosis not present

## 2019-03-04 MED ORDER — ALPRAZOLAM 1 MG PO TABS: 0.5000 mg | ORAL_TABLET | Freq: Two times a day (BID) | ORAL | 0 refills | Status: DC | PRN

## 2019-03-04 NOTE — Patient Instructions (Addendum)
1. One refill of Xanax (0.5 mg twice a day as needed for anxiety) is ordered. I will not be able to do any more refill without evaluation. Please contact the office to make follow up appointment. Please also note that this medication is not the medication to be on for long term. I would highly recommend that you will reinitiate antidepressant.

## 2019-03-07 ENCOUNTER — Other Ambulatory Visit: Payer: Self-pay | Admitting: Family Medicine

## 2019-03-10 ENCOUNTER — Encounter: Payer: Self-pay | Admitting: Family Medicine

## 2019-03-10 ENCOUNTER — Ambulatory Visit (INDEPENDENT_AMBULATORY_CARE_PROVIDER_SITE_OTHER): Payer: Medicare HMO | Admitting: Psychiatry

## 2019-03-10 ENCOUNTER — Telehealth: Payer: Self-pay | Admitting: Family Medicine

## 2019-03-10 ENCOUNTER — Other Ambulatory Visit: Payer: Self-pay

## 2019-03-10 ENCOUNTER — Telehealth: Payer: Self-pay

## 2019-03-10 DIAGNOSIS — F4310 Post-traumatic stress disorder, unspecified: Secondary | ICD-10-CM

## 2019-03-10 DIAGNOSIS — R69 Illness, unspecified: Secondary | ICD-10-CM | POA: Diagnosis not present

## 2019-03-10 NOTE — Telephone Encounter (Signed)
Pt called  Stating that she has decided that she is not going to stay with Dr Modesta Messing due to adverse s/e of meds prescribed and becauses she told her she was going to stop xanax. States that her counsellor stated that she will find her a new prescriber
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
## 2019-03-10 NOTE — Telephone Encounter (Signed)
Writer received an message from the patient PCP office stating the the patient needs to speak to me.    Patient received [facility] services in the past and was referred to Dr. Modesta Messing.   Patient reports that she wants to be discharged from the practice.  Patient reports that, "she informed Dr. Modesta Messing that she was no longer her psychiatrist on the [place] appt last week".    Writer routed this information to Dr. Modesta Messing.    Writer coordinated with the patients PCP and they will submit a new referral for the patient to be referred to a another psychiatrist outside of the [facility] system.   Writer informed the Administrative Team Lead of the patient desire to leave services with the practice.

## 2019-03-11 ENCOUNTER — Other Ambulatory Visit: Payer: Self-pay | Admitting: Family Medicine

## 2019-03-11 ENCOUNTER — Telehealth (HOSPITAL_COMMUNITY): Payer: Self-pay | Admitting: Psychiatry

## 2019-03-11 NOTE — Telephone Encounter (Signed)
Late entry. Received a notification from Ms. Jane English, who was informed that the patient requested transfer of her psychiatry care to other provider. This Probation officer is fine with the transfer if the patient wishes. Noted that this patient was originally transferred from Dr. Harrington Challenger in our clinic due to the patient request. It is advised to seek care outside of [place] office.

## 2019-03-19 ENCOUNTER — Ambulatory Visit (HOSPITAL_COMMUNITY): Payer: Medicare HMO

## 2019-04-06 ENCOUNTER — Telehealth: Payer: Self-pay | Admitting: Family Medicine

## 2019-04-06 ENCOUNTER — Other Ambulatory Visit: Payer: Self-pay

## 2019-04-06 MED ORDER — CLONIDINE HCL 0.1 MG PO TABS: ORAL_TABLET | ORAL | 0 refills | Status: DC

## 2019-04-06 NOTE — Telephone Encounter (Signed)
Clonidine, please send this over to the pharmacy she is out

## 2019-04-06 NOTE — Telephone Encounter (Signed)
Medication refilled

## 2019-04-07 ENCOUNTER — Telehealth: Payer: Self-pay | Admitting: Family Medicine

## 2019-04-07 NOTE — Telephone Encounter (Signed)
Pt is wanting to know if you will continue her xanax, Dr hiseda is not prescribing them anymore, and you are the one that originally put her on them. (per the pt)

## 2019-04-07 NOTE — Telephone Encounter (Signed)
She needs a Psychiatrist to prescribe ,I will n ot prescribe.  I see that a note from Dr Modesta Messing , she was prescribed the xanax in 05/15 by Dr Modesta Messing with a note that she will have no additional refills without a visi, so I recommend she continue with Dr Jimmey Ralph who has prescribed the xanax as recently as May

## 2019-04-14 ENCOUNTER — Other Ambulatory Visit: Payer: Self-pay | Admitting: Family Medicine

## 2019-04-14 ENCOUNTER — Telehealth: Payer: Self-pay | Admitting: *Deleted

## 2019-04-14 MED ORDER — OLOPATADINE HCL 0.1 % OP SOLN: 1.0000 [drp] | Freq: Two times a day (BID) | OPHTHALMIC | 12 refills | Status: DC

## 2019-04-14 NOTE — Telephone Encounter (Signed)
Medication is sent in please let her know

## 2019-04-14 NOTE — Telephone Encounter (Signed)
Advised patient that medication has been sent in.

## 2019-04-14 NOTE — Progress Notes (Unsigned)
atanol

## 2019-04-14 NOTE — Telephone Encounter (Signed)
Jane English called requesting eye drops for her allergies. Said she didn't know what the name of it was but Dr. Moshe Cipro normally called them in for her during allergy season. It helped to numb the eye. Also wanted to let Dr. Moshe Cipro know Jacklynn Lewis (husband) had moved back to [place] and wanted to take him out of the system as he had found another Dr.

## 2019-04-17 ENCOUNTER — Other Ambulatory Visit: Payer: Self-pay | Admitting: Family Medicine

## 2019-04-20 ENCOUNTER — Encounter: Payer: Self-pay | Admitting: Family Medicine

## 2019-04-21 ENCOUNTER — Other Ambulatory Visit: Payer: Self-pay

## 2019-04-21 ENCOUNTER — Other Ambulatory Visit (HOSPITAL_COMMUNITY)
Admission: RE | Admit: 2019-04-21 | Discharge: 2019-04-21 | Disposition: A | Discharge status: Home / Self Care | Payer: Medicare HMO | Source: Ambulatory Visit | Attending: Family Medicine | Admitting: Family Medicine

## 2019-04-21 ENCOUNTER — Ambulatory Visit (INDEPENDENT_AMBULATORY_CARE_PROVIDER_SITE_OTHER): Payer: BC Managed Care – PPO | Admitting: Family Medicine

## 2019-04-21 ENCOUNTER — Encounter: Payer: Self-pay | Admitting: Family Medicine

## 2019-04-21 VITALS — BP 118/78 | HR 100 | Temp 98.2°F | Resp 15 | Ht 66.0 in | Wt 170.0 lb

## 2019-04-21 DIAGNOSIS — Z124 Encounter for screening for malignant neoplasm of cervix: Secondary | ICD-10-CM | POA: Diagnosis present

## 2019-04-21 DIAGNOSIS — Z1231 Encounter for screening mammogram for malignant neoplasm of breast: Secondary | ICD-10-CM

## 2019-04-21 DIAGNOSIS — E663 Overweight: Secondary | ICD-10-CM

## 2019-04-21 DIAGNOSIS — R87610 Atypical squamous cells of undetermined significance on cytologic smear of cervix (ASC-US): Secondary | ICD-10-CM | POA: Diagnosis not present

## 2019-04-21 DIAGNOSIS — Z79899 Other long term (current) drug therapy: Secondary | ICD-10-CM

## 2019-04-21 DIAGNOSIS — R69 Illness, unspecified: Secondary | ICD-10-CM | POA: Diagnosis not present

## 2019-04-21 DIAGNOSIS — M549 Dorsalgia, unspecified: Secondary | ICD-10-CM

## 2019-04-21 DIAGNOSIS — I10 Essential (primary) hypertension: Secondary | ICD-10-CM | POA: Diagnosis not present

## 2019-04-21 DIAGNOSIS — Z0000 Encounter for general adult medical examination without abnormal findings: Secondary | ICD-10-CM | POA: Diagnosis not present

## 2019-04-21 DIAGNOSIS — R7301 Impaired fasting glucose: Secondary | ICD-10-CM | POA: Diagnosis not present

## 2019-04-21 DIAGNOSIS — F411 Generalized anxiety disorder: Secondary | ICD-10-CM | POA: Diagnosis not present

## 2019-04-21 DIAGNOSIS — Z1151 Encounter for screening for human papillomavirus (HPV): Secondary | ICD-10-CM | POA: Diagnosis not present

## 2019-04-21 DIAGNOSIS — E66811 Obesity, class 1: Secondary | ICD-10-CM

## 2019-04-21 DIAGNOSIS — R87810 Cervical high risk human papillomavirus (HPV) DNA test positive: Secondary | ICD-10-CM

## 2019-04-21 DIAGNOSIS — E669 Obesity, unspecified: Secondary | ICD-10-CM

## 2019-04-21 MED ORDER — PHENTERMINE HCL 37.5 MG PO TABS: 37.5000 mg | ORAL_TABLET | Freq: Every day | ORAL | 2 refills | Status: DC

## 2019-04-21 MED ORDER — POTASSIUM CHLORIDE 20 MEQ/15ML (10%) PO SOLN: 20.0000 meq | Freq: Two times a day (BID) | ORAL | 1 refills | Status: DC

## 2019-04-21 MED ORDER — CLONIDINE HCL 0.1 MG PO TABS: ORAL_TABLET | ORAL | 0 refills | Status: DC

## 2019-04-21 MED ORDER — ALPRAZOLAM 1 MG PO TABS: ORAL_TABLET | ORAL | 3 refills | Status: DC

## 2019-04-21 NOTE — Patient Instructions (Addendum)
F/U in 12 weeks, call if you need me sooner  Mammogram to be scheduled at checkout please  Fasting lipid, cmp and eGFr, hBa1C 1 week before next visit  You will be referred to Dr Merlene Laughter for chronic pain management   Congrats on weight loss.  Xanax prescribed by me, ONE tablet once daily  Phentermine to continue at one tablet once daily  It is important that you exercise regularly at least 30 minutes 5 times a week. If you develop chest pain, have severe difficulty breathing, or feel very tired, stop exercising immediately and seek medical attention

## 2019-04-22 ENCOUNTER — Encounter: Payer: Self-pay | Admitting: Family Medicine

## 2019-04-22 LAB — COMPLETE METABOLIC PANEL WITH GFR
AG Ratio: 1.5 (calc) (ref 1.0–2.5)
ALT: 34 U/L — ABNORMAL HIGH (ref 6–29)
AST: 28 U/L (ref 10–35)
Albumin: 4.3 g/dL (ref 3.6–5.1)
Alkaline phosphatase (APISO): 66 U/L (ref 37–153)
BUN: 11 mg/dL (ref 7–25)
CO2: 28 mmol/L (ref 20–32)
Calcium: 9.3 mg/dL (ref 8.6–10.4)
Chloride: 103 mmol/L (ref 98–110)
Creat: 1 mg/dL (ref 0.50–1.05)
GFR, Est African American: 76 mL/min/{1.73_m2} (ref >=60)
GFR, Est Non African American: 66 mL/min/{1.73_m2} (ref >=60)
Globulin: 2.9 g/dL (calc) (ref 1.9–3.7)
Glucose, Bld: 90 mg/dL (ref 65–99)
Potassium: 3.8 mmol/L (ref 3.5–5.3)
Sodium: 139 mmol/L (ref 135–146)
Total Bilirubin: 0.4 mg/dL (ref 0.2–1.2)
Total Protein: 7.2 g/dL (ref 6.1–8.1)

## 2019-04-22 LAB — HEMOGLOBIN A1C
Hgb A1c MFr Bld: 5.8 % of total Hgb — ABNORMAL HIGH (ref <5.7)
Mean Plasma Glucose: 120 (calc)
eAG (mmol/L): 6.6 (calc)

## 2019-04-22 LAB — LIPID PANEL
Cholesterol: 183 mg/dL (ref <200)
HDL: 56 mg/dL (ref >=50)
LDL Cholesterol (Calc): 109 mg/dL (calc) — ABNORMAL HIGH
Non-HDL Cholesterol (Calc): 127 mg/dL (calc) (ref <130)
Total CHOL/HDL Ratio: 3.3 (calc) (ref <5.0)
Triglycerides: 86 mg/dL (ref <150)

## 2019-04-22 NOTE — Telephone Encounter (Signed)
Pt aware.

## 2019-04-26 ENCOUNTER — Telehealth (HOSPITAL_COMMUNITY): Payer: Self-pay | Admitting: Family Medicine

## 2019-04-26 ENCOUNTER — Encounter: Payer: Self-pay | Admitting: Family Medicine

## 2019-04-26 DIAGNOSIS — F32A Depression, unspecified: Secondary | ICD-10-CM | POA: Insufficient documentation

## 2019-04-26 DIAGNOSIS — E663 Overweight: Secondary | ICD-10-CM | POA: Insufficient documentation

## 2019-04-26 DIAGNOSIS — F411 Generalized anxiety disorder: Secondary | ICD-10-CM | POA: Insufficient documentation

## 2019-04-26 NOTE — Assessment & Plan Note (Signed)
Pap repeated

## 2019-04-26 NOTE — Assessment & Plan Note (Signed)
Currently untreated , has been at pain center in the past, requests referral to local Pain clinic

## 2019-04-26 NOTE — Assessment & Plan Note (Signed)
Annual exam as documented. Counseling done  re healthy lifestyle involving commitment to 150 minutes exercise per week, heart healthy diet, and attaining healthy weight.The importance of adequate sleep also discussed. Changes in health habits are decided on by the patient with goals and time frames  set for achieving them. Immunization and cancer screening needs are specifically addressed at this visit. 

## 2019-04-26 NOTE — Assessment & Plan Note (Signed)
  Patient re-educated about  the importance of commitment to a  minimum of 150 minutes of exercise per week as able.  The importance of healthy food choices with portion control discussed, as well as eating regularly and within a 12 hour window most days. The need to choose "clean , green" food 50 to 75% of the time is discussed, as well as to make water the primary drink and set a goal of 64 ounces water daily.    Weight /BMI 04/21/2019 01/14/2019 08/27/2018  WEIGHT 170 lb 193 lb 1.9 oz 166 lb 1.9 oz  HEIGHT 5\' 6"  5\' 6"  5\' 6"   BMI 27.44 kg/m2 31.17 kg/m2 26.81 kg/m2  Some encounter information is confidential and restricted. Go to Review Flowsheets activity to see all data.

## 2019-04-26 NOTE — Assessment & Plan Note (Signed)
  Patient re-educated about  the importance of commitment to a  minimum of 150 minutes of exercise per week as able.  The importance of healthy food choices with portion control discussed, as well as eating regularly and within a 12 hour window most days. The need to choose "clean , green" food 50 to 75% of the time is discussed, as well as to make water the primary drink and set a goal of 64 ounces water daily.    Weight /BMI 04/21/2019 01/14/2019 08/27/2018  WEIGHT 170 lb 193 lb 1.9 oz 166 lb 1.9 oz  HEIGHT 5\' 6"  5\' 6"  5\' 6"   BMI 27.44 kg/m2 31.17 kg/m2 26.81 kg/m2  Some encounter information is confidential and restricted. Go to Review Flowsheets activity to see all data.    Excellent response to phentermine, continue for an additional 3 months only. importance of eating on schedule is stressed, also needs to commit to regular exercise

## 2019-04-26 NOTE — Progress Notes (Signed)
Jane English     MRN: [id number]      DOB: [birth year]  HPI: Patient is in for annual physical exam. C/o untreated and uncontrolled back pain. C/o anxiety and depression, not yet established with Psychiatry, requests resumption/ continuation of xana Doing well with phentermine and wants to continue this Spouse recently left again after an argument and she states she will not take him back PE: Pleasant  female, alert and oriented x 3, in no cardio-pulmonary distress. Afebrile. HEENT No facial trauma or asymetry. Sinuses non tender.  Extra occullar muscles intact,External ears normal,  Oropharynx moist, no exudate. Neck: supple  Chest: Clear to ascultation bilaterally.No crackles or wheezes. Non tender to palpation  Breast: Not examined, mammogram to be scheduled  Cardiovascular system; Heart sounds normal,  S1 and  S2 ,no S3.  No murmur, or thrill. Apical beat not displaced Peripheral pulses normal.  Abdomen: Soft, non tender, no organomegaly or masses. No bruits. Bowel sounds normal. No guarding, tenderness or rebound.   GU: External genitalia normal female genitalia , normal female distribution of hair. No lesions. Urethral meatus normal in size, no  Prolapse, no lesions visibly  Present. Bladder non tender. Vagina pink and moist , with no visible lesions , discharge present . Adequate pelvic support no  cystocele or rectocele noted Cervix pink and appears healthy, no lesions or ulcerations noted, no discharge noted from os Uterus normal size, no adnexal masses, no cervical motion or adnexal tenderness.   Musculoskeletal exam: Decreased ROM of lumbar spine, adequate in hips , shoulders and knees. No deformity ,swelling or crepitus noted. No muscle wasting or atrophy.   Neurologic: Cranial nerves 2 to 12 intact. Power, tone ,sensation  normal throughout. No disturbance in gait. No tremor.  Skin: Intact, no ulceration, erythema , scaling or rash noted.  Pigmentation normal throughout  Psych; Normal mood and affect. Judgement and concentration normal   Assessment & Plan:  Annual physical exam Annual exam as documented. Counseling done  re healthy lifestyle involving commitment to 150 minutes exercise per week, heart healthy diet, and attaining healthy weight.The importance of adequate sleep also discussed. Changes in health habits are decided on by the patient with goals and time frames  set for achieving them. Immunization and cancer screening needs are specifically addressed at this visit.   Pap smear of cervix shows high risk HPV present Pap repeated  Back pain with radiation Currently untreated , has been at pain center in the past, requests referral to local Pain clinic  Obesity (BMI 30.0-34.9)  Patient re-educated about  the importance of commitment to a  minimum of 150 minutes of exercise per week as able.  The importance of healthy food choices with portion control discussed, as well as eating regularly and within a 12 hour window most days. The need to choose "clean , green" food 50 to 75% of the time is discussed, as well as to make water the primary drink and set a goal of 64 ounces water daily.    Weight /BMI 04/21/2019 01/14/2019 08/27/2018  WEIGHT 170 lb 193 lb 1.9 oz 166 lb 1.9 oz  HEIGHT 5\' 6"  5\' 6"  5\' 6"   BMI 27.44 kg/m2 31.17 kg/m2 26.81 kg/m2  Some encounter information is confidential and restricted. Go to Review Flowsheets activity to see all data.      Overweight (BMI 25.0-29.9)  Patient re-educated about  the importance of commitment to a  minimum of 150 minutes of exercise per week as  able.  The importance of healthy food choices with portion control discussed, as well as eating regularly and within a 12 hour window most days. The need to choose "clean , green" food 50 to 75% of the time is discussed, as well as to make water the primary drink and set a goal of 64 ounces water daily.    Weight /BMI  04/21/2019 01/14/2019 08/27/2018  WEIGHT 170 lb 193 lb 1.9 oz 166 lb 1.9 oz  HEIGHT 5\' 6"  5\' 6"  5\' 6"   BMI 27.44 kg/m2 31.17 kg/m2 26.81 kg/m2  Some encounter information is confidential and restricted. Go to Review Flowsheets activity to see all data.    Excellent response to phentermine, continue for an additional 3 months only. importance of eating on schedule is stressed, also needs to commit to regular exercise  GAD (generalized anxiety disorder) Continue xanax at current dose. Continue current therapist Pt seeking new Psych

## 2019-04-26 NOTE — Telephone Encounter (Signed)
04/26/19 4:55pm Called and tried to explained to her due to issues in the [place] office with Dr. Ed Blalock  and the providers protocol we are unable to schedule an appointment in the [place]. Tried to give other option(s)  to call and get an appointment - patient not listing.  Patient upset and stated " It's all good" and she  hung-up the phone.Marland KitchenMariana Kaufman

## 2019-04-26 NOTE — Assessment & Plan Note (Signed)
Continue xanax at current dose. Continue current therapist Pt seeking new Psych

## 2019-04-27 ENCOUNTER — Other Ambulatory Visit: Payer: Self-pay | Admitting: Family Medicine

## 2019-04-27 DIAGNOSIS — R87610 Atypical squamous cells of undetermined significance on cytologic smear of cervix (ASC-US): Secondary | ICD-10-CM

## 2019-04-27 DIAGNOSIS — R69 Illness, unspecified: Secondary | ICD-10-CM | POA: Diagnosis not present

## 2019-04-27 LAB — CYTOLOGY - PAP
Diagnosis: UNDETERMINED — AB
HPV: NOT DETECTED

## 2019-04-27 NOTE — Progress Notes (Signed)
amb gyne

## 2019-04-28 ENCOUNTER — Inpatient Hospital Stay (HOSPITAL_COMMUNITY): Admission: RE | Admit: 2019-04-28 | Payer: Medicare HMO | Source: Ambulatory Visit

## 2019-04-29 ENCOUNTER — Ambulatory Visit (HOSPITAL_COMMUNITY): Payer: Medicare HMO | Admitting: Psychiatry

## 2019-05-13 ENCOUNTER — Ambulatory Visit (HOSPITAL_COMMUNITY): Payer: Medicare HMO | Admitting: Psychiatry

## 2019-05-17 ENCOUNTER — Encounter: Payer: Self-pay | Admitting: Obstetrics and Gynecology

## 2019-05-17 ENCOUNTER — Ambulatory Visit: Payer: Medicare HMO | Admitting: Obstetrics and Gynecology

## 2019-05-17 ENCOUNTER — Other Ambulatory Visit: Payer: Self-pay

## 2019-05-17 ENCOUNTER — Other Ambulatory Visit: Payer: Self-pay | Admitting: Family Medicine

## 2019-05-17 VITALS — BP 118/75 | HR 88 | Ht 66.0 in | Wt 169.4 lb

## 2019-05-17 DIAGNOSIS — R87610 Atypical squamous cells of undetermined significance on cytologic smear of cervix (ASC-US): Secondary | ICD-10-CM | POA: Diagnosis not present

## 2019-05-17 MED ORDER — MEDROXYPROGESTERONE ACETATE 10 MG PO TABS: 10.0000 mg | ORAL_TABLET | Freq: Every day | ORAL | 3 refills | Status: DC

## 2019-05-17 NOTE — Progress Notes (Signed)
Patient ID: [name], female   DOB: [birth year], 48 y.o.   MRN: [id number]    [facility] Visit  '@DATE' @            Patient name: JANISSA BERTRAM MRN [id number]  Date of birth: July 25, 1971  CC & HPI:  SHACORA ZYNDA is a 48 y.o. female presenting today for colposcopy but not needed. Has some light blood on toilet paper when urinating. When she wear a pantiliner no blood comes in contact. Says that her period comes and goes whenever it wants to.  ROS:  ROS +menses  Pertinent History Reviewed:   Reviewed:  Medical         Past Medical History:  Diagnosis Date  . Anemia   . Asthma   . Asthma flare 04/09/2013  . Back pain   . Bronchitis   . Chronic abdominal pain   . Chronic constipation   . Constipation due to opioid therapy   . Depression   . Diabetes mellitus without complication ([place])   . Diabetes mellitus, type II ([place])   . DVT (deep venous thrombosis) ([place]) 2010  . GERD (gastroesophageal reflux disease)   . Heart murmur    no cardiologist  . Helicobacter pylori gastritis 06/11/2013   Colonoscopy Dr. Hilarie Fredrickson  . Hypertension   . IBS (irritable bowel syndrome)   . Migraine headache   . Neuropathy   . Obesity   . Obsessive-compulsive disorder   . PSYCHOTIC D/O W/HALLUCINATIONS CONDS CLASS ELSW 03/04/2010   Qualifier: Diagnosis of  By: Moshe Cipro MD, Joycelyn Schmid    . PTSD (post-traumatic stress disorder)   . SBO (small bowel obstruction) ([place]) 08/09/2013  . Seasonal allergies 12/10/2012  . Seizures ([place])   . Shortness of breath                               Surgical Hx:    Past Surgical History:  Procedure Laterality Date  . ANTERIOR CERVICAL DECOMP/DISCECTOMY FUSION  07/07/2012   Procedure: ANTERIOR CERVICAL DECOMPRESSION/DISCECTOMY FUSION 2 LEVELS;  Surgeon: Floyce Stakes, MD;  Location: [facility];  Service: Neurosurgery;  Laterality: N/A;  Cervical four-five, five - six  Anterior cervical decompression/diskectomy/fusion/plate  . APPENDECTOMY  1986  . BOWEL RESECTION  N/A 07/29/2013   Procedure: serosal repair;  Surgeon: Adin Hector, MD;  Location: [facility];  Service: General;  Laterality: N/A;  . CARPAL TUNNEL RELEASE Bilateral   . LAPAROSCOPY N/A 07/29/2013   Procedure: diagnostic laporoscopy;  Surgeon: Adin Hector, MD;  Location: [facility];  Service: General;  Laterality: N/A;  . LAPAROSCOPY N/A 08/16/2013   Procedure: LAPAROSCOPY DIAGNOSTIC/LYSIS OF ADHESIONS;  Surgeon: Adin Hector, MD;  Location: [facility];  Service: General;  Laterality: N/A;  . LAPAROTOMY N/A 08/16/2013   Procedure: EXPLORATORY LAPAROTOMY/SMALL BOWEL RESECTION (JEJUNUM);  Surgeon: Adin Hector, MD;  Location: [facility];  Service: General;  Laterality: N/A;  . LUMBAR SPINE SURGERY  2010   x 3  . LYSIS OF ADHESION  2003   Dr. Irving Shows  . LYSIS OF ADHESION N/A 07/29/2013   Procedure: LYSIS OF ADHESION;  Surgeon: Adin Hector, MD;  Location: [facility];  Service: General;  Laterality: N/A;  . OOPHORECTOMY    . PARTIAL HYSTERECTOMY  1990s?   [place], [place]  . SPINAL CORD STIMULATOR IMPLANT    . TRIGGER FINGER RELEASE  2009   right pinkie finger  .  TUBAL LIGATION  1994   Medications: Reviewed & Updated - see associated section                       Current Outpatient Medications:  .  albuterol (PROVENTIL) (2.5 MG/3ML) 0.083% nebulizer solution, Take 3 mLs (2.5 mg total) by nebulization every 6 (six) hours as needed for wheezing or shortness of breath., Disp: 150 mL, Rfl: 1 .  albuterol (VENTOLIN HFA) 108 (90 Base) MCG/ACT inhaler, Inhale 2 puffs into the lungs every 6 (six) hours as needed for wheezing or shortness of breath., Disp: 1 Inhaler, Rfl: 2 .  ALPRAZolam (XANAX) 1 MG tablet, Take one tablet by mouth once daily for anxiety, Disp: 30 tablet, Rfl: 3 .  azelastine (ASTELIN) 0.1 % nasal spray, Place 2 sprays into both nostrils 2 (two) times daily. Use in each nostril as directed, Disp: 30 mL, Rfl: 12 .  Beclomethasone Diprop HFA (QVAR REDIHALER) 40 MCG/ACT AERB, Inhale 2  puffs into the lungs 2 (two) times daily., Disp: 10.6 g, Rfl: 5 .  cloNIDine (CATAPRES) 0.1 MG tablet, TAKE 1 TABLET BY MOUTH EVERYDAY AT BEDTIME, Disp: 90 tablet, Rfl: 0 .  clotrimazole-betamethasone (LOTRISONE) cream, APPLY TO AFFECTED AREA TWICE A DAY, Disp: 45 g, Rfl: 5 .  linaclotide (LINZESS) 290 MCG CAPS capsule, Take 1 capsule (290 mcg total) by mouth daily before breakfast. (Patient taking differently: Take 290 mcg by mouth at bedtime. ), Disp: 30 capsule, Rfl: 11 .  loratadine (CLARITIN) 10 MG tablet, TAKE 1 TABLET BY MOUTH EVERY DAY (Patient taking differently: Take 10 mg by mouth daily as needed for allergies. ), Disp: 30 tablet, Rfl: 5 .  lubiprostone (AMITIZA) 24 MCG capsule, Take 1 capsule (24 mcg total) by mouth 2 (two) times daily with a meal. (Patient taking differently: Take 24 mcg by mouth 2 (two) times daily. ), Disp: 60 capsule, Rfl: 11 .  montelukast (SINGULAIR) 10 MG tablet, TAKE 1 TABLET BY MOUTH EVERYDAY AT BEDTIME (Patient taking differently: Take 10 mg by mouth at bedtime. ), Disp: 90 tablet, Rfl: 1 .  NARCAN 4 MG/0.1ML LIQD nasal spray kit, Place 4 mg into the nose as directed., Disp: , Rfl: 1 .  olopatadine (PATANOL) 0.1 % ophthalmic solution, Place 1 drop into both eyes 2 (two) times daily., Disp: 5 mL, Rfl: 12 .  pantoprazole (PROTONIX) 40 MG tablet, TAKE 1 TABLET BY MOUTH EVERY DAY (Patient taking differently: Take 40 mg by mouth at bedtime. ), Disp: 90 tablet, Rfl: 2 .  phentermine (ADIPEX-P) 37.5 MG tablet, Take 1 tablet (37.5 mg total) by mouth daily before breakfast., Disp: 30 tablet, Rfl: 2 .  polyethylene glycol powder (GLYCOLAX/MIRALAX) powder, Take 17 grams (1 capful) dissolved in at least 8 ounces water juice and drink 3 times daily, Disp: 1530 g, Rfl: 11 .  potassium chloride 20 MEQ/15ML (10%) SOLN, Take 15 mLs (20 mEq total) by mouth 2 (two) times daily., Disp: 473 mL, Rfl: 1 .  triamterene-hydrochlorothiazide (MAXZIDE-25) 37.5-25 MG tablet, TAKE 1 TABLET BY  MOUTH EVERY DAY, Disp: 90 tablet, Rfl: 0 .  Vitamin D, Ergocalciferol, (DRISDOL) 50000 units CAPS capsule, TAKE 1 CAPSULE (50,000 UNITS TOTAL) BY MOUTH ONCE A WEEK. (Patient taking differently: Take 50,000 Units by mouth every 7 (seven) days. ), Disp: 12 capsule, Rfl: 1 .  Oxycodone HCl 10 MG TABS, Take 10 mg by mouth every 12 (twelve) hours as needed (for pain). , Disp: , Rfl: 0   Social History: Reviewed -  reports that she has never smoked. She has never used smokeless tobacco.  Objective Findings:  Vitals: Blood pressure 118/75, pulse 88, height '5\' 6"'  (1.676 m), weight 169 lb 6.4 oz (76.8 kg).  PHYSICAL EXAMINATION General appearance - alert, well appearing, and in no distress Mental status - alert, oriented to person, place, and time, normal mood, behavior, speech, dress, motor activity, and thought processes, affect appropriate to mood  PELVIC External genitalia - normal Urethra- appears normal Vagina - vaginal blood Cervix - blood consistent with menses Uterus - normal   Assessment & Plan:   A:  1. ASCUS with neg HPV 2. Menses consistent today 3. Amnorrhea  P:  1. Repeat PAP in 1 year with [facility] 2. Rx Provera 14 days q 3 months.    By signing my name below, I, Samul Dada, attest that this documentation has been prepared under the direction and in the presence of Jonnie Kind, MD. Electronically Signed: [place]. 05/17/19. 10:26 AM.  I personally performed the services described in this documentation, which was SCRIBED in my presence. The recorded information has been reviewed and considered accurate. It has been edited as necessary during review. Jonnie Kind, MD

## 2019-05-21 DIAGNOSIS — R69 Illness, unspecified: Secondary | ICD-10-CM | POA: Diagnosis not present

## 2019-05-24 DIAGNOSIS — R69 Illness, unspecified: Secondary | ICD-10-CM | POA: Diagnosis not present

## 2019-05-25 ENCOUNTER — Other Ambulatory Visit: Payer: Self-pay | Admitting: Family Medicine

## 2019-06-02 ENCOUNTER — Inpatient Hospital Stay (HOSPITAL_COMMUNITY): Admission: RE | Admit: 2019-06-02 | Payer: Medicare HMO | Source: Ambulatory Visit

## 2019-06-07 ENCOUNTER — Other Ambulatory Visit: Payer: Self-pay

## 2019-06-07 MED ORDER — TRIAMTERENE-HCTZ 37.5-25 MG PO TABS: ORAL_TABLET | ORAL | 1 refills | Status: DC

## 2019-06-09 ENCOUNTER — Ambulatory Visit (HOSPITAL_COMMUNITY)
Admission: RE | Admit: 2019-06-09 | Discharge: 2019-06-09 | Disposition: A | Discharge status: Home / Self Care | Payer: BC Managed Care – PPO | Source: Ambulatory Visit | Attending: Family Medicine | Admitting: Family Medicine

## 2019-06-09 DIAGNOSIS — Z1231 Encounter for screening mammogram for malignant neoplasm of breast: Secondary | ICD-10-CM | POA: Insufficient documentation

## 2019-06-10 ENCOUNTER — Ambulatory Visit (HOSPITAL_COMMUNITY): Payer: Medicare HMO

## 2019-06-16 ENCOUNTER — Other Ambulatory Visit: Payer: Self-pay | Admitting: Internal Medicine

## 2019-06-20 ENCOUNTER — Other Ambulatory Visit: Payer: Self-pay | Admitting: Family Medicine

## 2019-06-20 ENCOUNTER — Other Ambulatory Visit: Payer: Self-pay | Admitting: Internal Medicine

## 2019-06-24 DIAGNOSIS — R69 Illness, unspecified: Secondary | ICD-10-CM | POA: Diagnosis not present

## 2019-07-13 DIAGNOSIS — R69 Illness, unspecified: Secondary | ICD-10-CM | POA: Diagnosis not present

## 2019-07-15 ENCOUNTER — Ambulatory Visit: Payer: Medicare HMO | Admitting: Family Medicine

## 2019-07-18 DIAGNOSIS — Z0100 Encounter for examination of eyes and vision without abnormal findings: Secondary | ICD-10-CM | POA: Diagnosis not present

## 2019-07-18 DIAGNOSIS — H539 Unspecified visual disturbance: Secondary | ICD-10-CM | POA: Diagnosis not present

## 2019-07-19 DIAGNOSIS — Z0100 Encounter for examination of eyes and vision without abnormal findings: Secondary | ICD-10-CM | POA: Diagnosis not present

## 2019-08-04 ENCOUNTER — Other Ambulatory Visit: Payer: Self-pay | Admitting: Family Medicine

## 2019-08-06 ENCOUNTER — Other Ambulatory Visit: Payer: Self-pay | Admitting: Family Medicine

## 2019-08-10 ENCOUNTER — Ambulatory Visit (INDEPENDENT_AMBULATORY_CARE_PROVIDER_SITE_OTHER): Payer: BC Managed Care – PPO | Admitting: Family Medicine

## 2019-08-10 ENCOUNTER — Encounter: Payer: Self-pay | Admitting: Family Medicine

## 2019-08-10 ENCOUNTER — Other Ambulatory Visit: Payer: Self-pay

## 2019-08-10 VITALS — BP 118/74 | Temp 97.6°F | Resp 15 | Ht 66.0 in | Wt 181.0 lb

## 2019-08-10 DIAGNOSIS — G894 Chronic pain syndrome: Secondary | ICD-10-CM | POA: Diagnosis not present

## 2019-08-10 DIAGNOSIS — I10 Essential (primary) hypertension: Secondary | ICD-10-CM | POA: Diagnosis not present

## 2019-08-10 DIAGNOSIS — E663 Overweight: Secondary | ICD-10-CM | POA: Diagnosis not present

## 2019-08-10 DIAGNOSIS — Z23 Encounter for immunization: Secondary | ICD-10-CM

## 2019-08-10 DIAGNOSIS — R69 Illness, unspecified: Secondary | ICD-10-CM | POA: Diagnosis not present

## 2019-08-10 DIAGNOSIS — M549 Dorsalgia, unspecified: Secondary | ICD-10-CM | POA: Diagnosis not present

## 2019-08-10 DIAGNOSIS — M5090 Cervical disc disorder, unspecified, unspecified cervical region: Secondary | ICD-10-CM

## 2019-08-10 DIAGNOSIS — R3915 Urgency of urination: Secondary | ICD-10-CM | POA: Diagnosis not present

## 2019-08-10 DIAGNOSIS — F411 Generalized anxiety disorder: Secondary | ICD-10-CM

## 2019-08-10 MED ORDER — PHENTERMINE HCL 37.5 MG PO TABS: ORAL_TABLET | ORAL | 1 refills | Status: DC

## 2019-08-10 MED ORDER — ALPRAZOLAM 1 MG PO TABS: ORAL_TABLET | ORAL | 3 refills | Status: DC

## 2019-08-10 MED ORDER — KETOROLAC TROMETHAMINE 60 MG/2ML IM SOLN
60.0000 mg | Freq: Once | INTRAMUSCULAR | Status: AC
  Administered 2019-08-10: 60 mg via INTRAMUSCULAR

## 2019-08-10 MED ORDER — IBUPROFEN 600 MG PO TABS: ORAL_TABLET | ORAL | 0 refills | Status: DC

## 2019-08-10 NOTE — Patient Instructions (Signed)
F/u in 4 months, call if you need me before  Flu vaccine todaY  TORADOL 60 MG im AND SHORT COURSE OF IBUPROFEN FOR BACK AND NECK PAIN AND YOU ARE REFERRED TO [facility] FOR PAIN MANAGEMENT  Urine to be checked for infection, and medication started for urgency  You are referred to nutritionist for help with weight management  Continue half phentermine and  Xanax as before, these will be prescribed today as before

## 2019-08-10 NOTE — Progress Notes (Signed)
ARIEL LAUBENSTEIN     MRN: [id number]      DOB: October 28, 1971   HPI Ms. Vonstein is here for follow up and re-evaluation of chronic medical conditions, medication management and review of any available recent lab and radiology data.  Preventive health is updated, specifically  Cancer screening and Immunization.   Questions or concerns regarding consultations or procedures which the PT has had in the interim are  addressed. The PT denies any adverse reactions to current medications since the last visit.  Awaiting appt with Pain specialist, needs stimulator changed, has had spine surgery 4 times and remains in pain Frustrated with weight regain, feels caloric intake is minimal and she exercises regularly, wants help ROS Denies recent fever or chills. Denies sinus pressure, nasal congestion, ear pain or sore throat. Denies chest congestion, productive cough or wheezing. Denies chest pains, palpitations and leg swelling Denies abdominal pain, nausea, vomiting,diarrhea or constipation.   C/o urinary frequency and urge incontinence for months which is worsening  Denies depression, anxiety or insomnia. Denies skin break down or rash.   PE  BP 118/74    Temp 97.6 F (36.4 C) (Temporal)    Resp 15    Ht 5\' 6"  (1.676 m)    Wt 181 lb (82.1 kg)    SpO2 98%    BMI 29.21 kg/m   Patient alert and oriented and in no cardiopulmonary distress.  HEENT: No facial asymmetry, EOMI,   .  Neck decreased ROM no JVD, no mass.  Chest: Clear to auscultation bilaterally.  CVS: S1, S2 no murmurs, no S3.Regular rate.  .   Ext: No edema  MS: decreased  ROM spine, adequate in  shoulders, hips and knees.  Skin: Intact, no ulcerations or rash noted.  Psych: Good eye contact, normal affect. Memory intact mildly anxious, not depressed  appearing.  CNS: CN 2-12 intact, power,  normal throughout.no focal deficits noted.   Assessment & Plan  Urinary urgency Reports increased wetting episodes, needs CCUA and  trial of medication for incontinence  Chronic pain syndrome Has had 4 spine surgeries and remains in pain, Needs Pain management once more, referral entered  Cervical neck pain with evidence of disc disease Increased neck and upper extremity pain ion past several months, wants to re establish with pain management  Overweight (BMI 25.0-29.9) Weight regain on phenntermine, refer nutrition educaTOR  Patient re-educated about  the importance of commitment to a  minimum of 150 minutes of exercise per week as able.  The importance of healthy food choices with portion control discussed, as well as eating regularly and within a 12 hour window most days. The need to choose "clean , green" food 50 to 75% of the time is discussed, as well as to make water the primary drink and set a goal of 64 ounces water daily.    Weight /BMI 08/10/2019 05/17/2019 04/21/2019  WEIGHT 181 lb 169 lb 6.4 oz 170 lb  HEIGHT 5\' 6"  5\' 6"  5\' 6"   BMI 29.21 kg/m2 27.34 kg/m2 27.44 kg/m2  Some encounter information is confidential and restricted. Go to Review Flowsheets activity to see all data.      GAD (generalized anxiety disorder) Controlled, no change in medication   Essential hypertension Controlled, no change in medication DASH diet and commitment to daily physical activity for a minimum of 30 minutes discussed and encouraged, as a part of hypertension management. The importance of attaining a healthy weight is also discussed.  BP/Weight 08/10/2019 05/17/2019 04/21/2019  02/04/2019 01/14/2019 [id number]  Systolic BP 123456 123456 123456 [id number]  Diastolic BP 74 75 78 69 78 70 68  Wt. (Lbs) 181 169.4 170 - 193.12 166.12 172.4  BMI 29.21 27.34 27.44 - 31.17 26.81 27.83  Some encounter information is confidential and restricted. Go to Review Flowsheets activity to see all data.       Back pain with radiation UNCONTROLLED, TORADOL 60 MG IM AND SHORT COURSE OF IBUPROFEN

## 2019-08-11 ENCOUNTER — Telehealth: Payer: Self-pay

## 2019-08-11 DIAGNOSIS — R3915 Urgency of urination: Secondary | ICD-10-CM

## 2019-08-11 DIAGNOSIS — R7303 Prediabetes: Secondary | ICD-10-CM

## 2019-08-11 MED ORDER — SOLIFENACIN SUCCINATE 5 MG PO TABS: 5.0000 mg | ORAL_TABLET | Freq: Every day | ORAL | 3 refills | Status: DC

## 2019-08-11 NOTE — Assessment & Plan Note (Signed)
Increased neck and upper extremity pain ion past several months, wants to re establish with pain management

## 2019-08-11 NOTE — Assessment & Plan Note (Signed)
Controlled, no change in medication  

## 2019-08-11 NOTE — Assessment & Plan Note (Signed)
UNCONTROLLED, TORADOL 60 MG IM AND SHORT COURSE OF IBUPROFEN

## 2019-08-11 NOTE — Telephone Encounter (Signed)
Aware to go leave a urine specimen and referred to diabetes education

## 2019-08-11 NOTE — Assessment & Plan Note (Signed)
Reports increased wetting episodes, needs CCUA and trial of medication for incontinence

## 2019-08-11 NOTE — Assessment & Plan Note (Signed)
Has had 4 spine surgeries and remains in pain, Needs Pain management once more, referral entered

## 2019-08-11 NOTE — Assessment & Plan Note (Signed)
Weight regain on phenntermine, refer nutrition educaTOR  Patient re-educated about  the importance of commitment to a  minimum of 150 minutes of exercise per week as able.  The importance of healthy food choices with portion control discussed, as well as eating regularly and within a 12 hour window most days. The need to choose "clean , green" food 50 to 75% of the time is discussed, as well as to make water the primary drink and set a goal of 64 ounces water daily.    Weight /BMI 08/10/2019 05/17/2019 04/21/2019  WEIGHT 181 lb 169 lb 6.4 oz 170 lb  HEIGHT 5\' 6"  5\' 6"  5\' 6"   BMI 29.21 kg/m2 27.34 kg/m2 27.44 kg/m2  Some encounter information is confidential and restricted. Go to Review Flowsheets activity to see all data.

## 2019-08-11 NOTE — Assessment & Plan Note (Signed)
Controlled, no change in medication DASH diet and commitment to daily physical activity for a minimum of 30 minutes discussed and encouraged, as a part of hypertension management. The importance of attaining a healthy weight is also discussed.  BP/Weight 08/10/2019 05/17/2019 04/21/2019 02/04/2019 01/14/2019 [id number]  Systolic BP 123456 123456 123456 [id number]  Diastolic BP 74 75 78 69 78 70 68  Wt. (Lbs) 181 169.4 170 - 193.12 166.12 172.4  BMI 29.21 27.34 27.44 - 31.17 26.81 27.83  Some encounter information is confidential and restricted. Go to Review Flowsheets activity to see all data.

## 2019-08-12 DIAGNOSIS — R3915 Urgency of urination: Secondary | ICD-10-CM | POA: Diagnosis not present

## 2019-08-13 ENCOUNTER — Encounter: Payer: Self-pay | Admitting: Family Medicine

## 2019-08-13 ENCOUNTER — Telehealth: Payer: Self-pay | Admitting: Family Medicine

## 2019-08-13 ENCOUNTER — Other Ambulatory Visit: Payer: Self-pay | Admitting: Family Medicine

## 2019-08-13 ENCOUNTER — Telehealth: Payer: Self-pay

## 2019-08-13 ENCOUNTER — Telehealth: Payer: Self-pay | Admitting: *Deleted

## 2019-08-13 MED ORDER — NORGESTIM-ETH ESTRAD TRIPHASIC 0.18/0.215/0.25 MG-35 MCG PO TABS: ORAL_TABLET | ORAL | 11 refills | Status: DC

## 2019-08-13 MED ORDER — NORGESTIMATE-ETH ESTRADIOL 0.25-35 MG-MCG PO TABS: 1.0000 | ORAL_TABLET | Freq: Every day | ORAL | 0 refills | Status: DC

## 2019-08-13 NOTE — Telephone Encounter (Signed)
Pt states she was prescribed a birth control pill by Dr. Moshe Cipro but she hasn't started taking it yet. Pt has an appt with Dr. Glo Herring on Tuesday. Pt says she guesses she will start pill from Dr. Moshe Cipro but she wants Dr. Hubert Azure to explain what's going on with her. I advised to make sure she tells Dr. Glo Herring to explain her case. Pt voiced understanding. [place]

## 2019-08-13 NOTE — Telephone Encounter (Signed)
Chase LVm that 2 Birth controls was sent in and he needs to verify

## 2019-08-13 NOTE — Telephone Encounter (Signed)
Spoke with Cheri Rous and verified medications and dosing instructions.

## 2019-08-13 NOTE — Telephone Encounter (Signed)
I have prescribed ortho cyclen . Take 1 three times daily for 1 week, then fill second pack to take one daily, do not take week 4 of the first pack. Needs Gyne appt with Drferguson also , I will refer

## 2019-08-13 NOTE — Telephone Encounter (Signed)
Advised patient of treatment plan with verbal understanding 

## 2019-08-13 NOTE — Telephone Encounter (Signed)
Patient states she has been having extremely heavy bleeding with blood clots since last night.  Dr Moshe Cipro prescribed COC's for her to take to help with the bleeding but she is not understanding why this is being prescribed and how it is supposed to help her bleeding.  She is scheduled to see Dr Glo Herring on 10/13 but would like to talk with someone about this.

## 2019-08-13 NOTE — Telephone Encounter (Signed)
Patient called and stated that her cycle started last night and it is extremely heavy. So heavy that she has had to put on a depends basically. Is there something that can be called in for this? As soon as she sits down blood will go everywhere. She has been up all night dealing with it. Please advise.

## 2019-08-14 ENCOUNTER — Encounter: Payer: Self-pay | Admitting: Family Medicine

## 2019-08-14 LAB — URINALYSIS W MICROSCOPIC + REFLEX CULTURE
Bacteria, UA: NONE SEEN /HPF
Bilirubin Urine: NEGATIVE
Glucose, UA: NEGATIVE
Hyaline Cast: NONE SEEN /LPF
Ketones, ur: NEGATIVE
Nitrites, Initial: NEGATIVE
RBC / HPF: >=60 /HPF — AB (ref 0–2)
Specific Gravity, Urine: 1.028 (ref 1.001–1.03)
WBC, UA: NONE SEEN /HPF (ref 0–5)
pH: <=5 (ref 5.0–8.0)

## 2019-08-14 LAB — URINE CULTURE
MICRO NUMBER:: 973499
SPECIMEN QUALITY:: ADEQUATE

## 2019-08-14 LAB — CULTURE INDICATED

## 2019-08-16 ENCOUNTER — Telehealth: Payer: Self-pay

## 2019-08-16 NOTE — Telephone Encounter (Signed)
Called patient to follow up with her and see if she was feeling better from Friday and to also let her know her bladder medication has been approved and she only needs to call the pharmacy and have them re-run the prescription and it should go through

## 2019-08-17 ENCOUNTER — Encounter: Payer: Self-pay | Admitting: Obstetrics and Gynecology

## 2019-08-17 ENCOUNTER — Other Ambulatory Visit: Payer: Self-pay

## 2019-08-17 ENCOUNTER — Other Ambulatory Visit: Payer: Self-pay | Admitting: Obstetrics and Gynecology

## 2019-08-17 ENCOUNTER — Ambulatory Visit (INDEPENDENT_AMBULATORY_CARE_PROVIDER_SITE_OTHER): Payer: BC Managed Care – PPO | Admitting: Obstetrics and Gynecology

## 2019-08-17 VITALS — BP 128/82 | HR 92 | Ht 66.0 in | Wt 177.0 lb

## 2019-08-17 DIAGNOSIS — N926 Irregular menstruation, unspecified: Secondary | ICD-10-CM | POA: Diagnosis not present

## 2019-08-17 DIAGNOSIS — N939 Abnormal uterine and vaginal bleeding, unspecified: Secondary | ICD-10-CM

## 2019-08-17 NOTE — Progress Notes (Signed)
Patient ID: Jane English, female   DOB: [birth year], 48 y.o.   MRN: [id number]    [facility] Visit  '@DATE' @            Patient name: Jane English MRN [id number]  Date of birth: 10/25/1971  CC & HPI:  Jane English is a 48 y.o. female presenting today for excessive vaginal bleeding. Had taken the provera in July and no bleeding after taken for 14 day. Got up this past Tuesday 08/10/2019, with some  minimal blood. On Wednesday felt warm blood running down her, when she stepped into the shower clots started coming out. Clots just recently stopped in last 2 days. She went to sleep Thursday evening with bed cloths and woke up with blood everywhere.   ROS:  ROS Copy of u/s 2018:  GYNECOLOGIC SONOGRAM   Jane English is a 48 y.o. for a pelvic sonogram for amenorrhea w/RLQ pain.  Uterus                      9.5 x 6.3 x 6 cm,  heterogeneous anteverted uterus w/ two post intramural fibroids  Endometrium          6.8 mm, symmetrical, wnl  Right ovary             Right oophorectomy  Left ovary                3.3 x 2.2 x 1.5 cm, wnl   Fibroids,(#1) post 2.8 x 1.6 x 1.3 cm (#2)post right 2.8 x 1.3 x 1.5 cm  Technician Comments:  PELVIC US TA/TV: Heterogeneous anteverted uterus w/ two post intramural fibroids,(#1) post 2.8 x 1.6 x 1.3 cm (#2)post right 2.8 x 1.3 x 1.5 cm,right oophorectomy,right adnexa wnl,normal left ov,EEC 6.8 mm,no free fluid,no pain during ultrasound,ovary appear mobile   [employer] 11/11/2016 12:10 PM  Clinical Impression and recommendations:  I have reviewed the sonogram results above. Pt with multiple surgeries for bowel resection and Lysisi of adhesions.  Combined with the patient's current clinical course, below are my impressions and any appropriate recommendations for management based on the sonographic findings:  1 the uterus and remaining left ovary are not identified as a source of pain. Small fibroids not considered clinically  significant . 2 Patient appears premenopausal and anovulatory as per labs and this u/s.   Jane English 11/11/2016  Pertinent History Reviewed:   Reviewed: Significant for  Medical         Past Medical History:  Diagnosis Date  . Anemia   . Asthma   . Asthma flare 04/09/2013  . Back pain   . Bronchitis   . Chronic abdominal pain   . Chronic constipation   . Constipation due to opioid therapy   . Depression   . Diabetes mellitus without complication ([place])   . Diabetes mellitus, type II ([place])   . DVT (deep venous thrombosis) ([place]) 2010  . GERD (gastroesophageal reflux disease)   . Heart murmur    no cardiologist  . Helicobacter pylori gastritis 06/11/2013   Colonoscopy Dr. Hilarie Fredrickson  . Hypertension   . IBS (irritable bowel syndrome)   . Migraine headache   . Neuropathy   . Obesity   . Obsessive-compulsive disorder   . PSYCHOTIC D/O W/HALLUCINATIONS CONDS CLASS ELSW 03/04/2010   Qualifier: Diagnosis of  By: Moshe Cipro MD, Joycelyn Schmid    . PTSD (post-traumatic stress disorder)   . SBO (small bowel obstruction) (  [place]) 08/09/2013  . Seasonal allergies 12/10/2012  . Seizures ([place])   . Shortness of breath                               Surgical Hx:    Past Surgical History:  Procedure Laterality Date  . ANTERIOR CERVICAL DECOMP/DISCECTOMY FUSION  07/07/2012   Procedure: ANTERIOR CERVICAL DECOMPRESSION/DISCECTOMY FUSION 2 LEVELS;  Surgeon: Floyce Stakes, MD;  Location: [facility];  Service: Neurosurgery;  Laterality: N/A;  Cervical four-five, five - six  Anterior cervical decompression/diskectomy/fusion/plate  . APPENDECTOMY  1986  . BOWEL RESECTION N/A 07/29/2013   Procedure: serosal repair;  Surgeon: Adin Hector, MD;  Location: [facility];  Service: General;  Laterality: N/A;  . CARPAL TUNNEL RELEASE Bilateral   . LAPAROSCOPY N/A 07/29/2013   Procedure: diagnostic laporoscopy;  Surgeon: Adin Hector, MD;  Location: [facility];  Service: General;  Laterality: N/A;  . LAPAROSCOPY N/A  08/16/2013   Procedure: LAPAROSCOPY DIAGNOSTIC/LYSIS OF ADHESIONS;  Surgeon: Adin Hector, MD;  [facility];  Service: General;  Laterality: N/A;  . LAPAROTOMY N/A 08/16/2013   Procedure: EXPLORATORY LAPAROTOMY/SMALL BOWEL RESECTION (JEJUNUM);  Surgeon: Adin Hector, MD;  [facility];  Service: General;  Laterality: N/A;  . LUMBAR SPINE SURGERY  2010   x 3  . LYSIS OF ADHESION  2003   Dr. Irving Shows  . LYSIS OF ADHESION N/A 07/29/2013   Procedure: LYSIS OF ADHESION;  Surgeon: Adin Hector, MD;  [facility];  Service: General;  Laterality: N/A;  . OOPHORECTOMY    . PARTIAL HYSTERECTOMY  1990s?   [place], [place]  . SPINAL CORD STIMULATOR IMPLANT    . TRIGGER FINGER RELEASE  2009   right pinkie finger  . TUBAL LIGATION  1994   Medications: Reviewed & Updated - see associated section                       Current Outpatient Medications:  .  albuterol (PROVENTIL) (2.5 MG/3ML) 0.083% nebulizer solution, Take 3 mLs (2.5 mg total) by nebulization every 6 (six) hours as needed for wheezing or shortness of breath., Disp: 150 mL, Rfl: 1 .  albuterol (VENTOLIN HFA) 108 (90 Base) MCG/ACT inhaler, TAKE 2 PUFFS BY MOUTH EVERY 6 HOURS AS NEEDED FOR WHEEZE OR SHORTNESS OF BREATH, Disp: 18 g, Rfl: 4 .  ALPRAZolam (XANAX) 1 MG tablet, Take one tablet by mouth once daily for anxiety, Disp: 30 tablet, Rfl: 3 .  azelastine (ASTELIN) 0.1 % nasal spray, Place 2 sprays into both nostrils 2 (two) times daily. Use in each nostril as directed, Disp: 30 mL, Rfl: 12 .  Beclomethasone Diprop HFA (QVAR REDIHALER) 40 MCG/ACT AERB, Inhale 2 puffs into the lungs 2 (two) times daily., Disp: 10.6 g, Rfl: 5 .  cloNIDine (CATAPRES) 0.1 MG tablet, TAKE 1 TABLET BY MOUTH EVERYDAY AT BEDTIME, Disp: 90 tablet, Rfl: 0 .  clotrimazole-betamethasone (LOTRISONE) cream, APPLY TO AFFECTED AREA TWICE A DAY, Disp: 45 g, Rfl: 5 .  ibuprofen (ADVIL) 600 MG tablet, Take one tablet by mouth three times daily for back  and neck pain for 5 days, Disp: 15 tablet, Rfl: 0 .  LINZESS 290 MCG CAPS capsule, TAKE 1 CAPSULE (290 MCG TOTAL) BY MOUTH DAILY BEFORE BREAKFAST., Disp: 90 capsule, Rfl: 2 .  loratadine (CLARITIN) 10 MG tablet, TAKE 1 TABLET BY MOUTH EVERY DAY, Disp: 90  tablet, Rfl: 1 .  lubiprostone (AMITIZA) 24 MCG capsule, Take 1 capsule (24 mcg total) by mouth 2 (two) times daily., Disp: 180 capsule, Rfl: 2 .  medroxyPROGESTERone (PROVERA) 10 MG tablet, Take 1 tablet (10 mg total) by mouth daily. One tablet daily x 2 weeks. Repeat as directed, Disp: 14 tablet, Rfl: 3 .  montelukast (SINGULAIR) 10 MG tablet, TAKE 1 TABLET BY MOUTH EVERYDAY AT BEDTIME, Disp: 90 tablet, Rfl: 0 .  NARCAN 4 MG/0.1ML LIQD nasal spray kit, Place 4 mg into the nose as directed., Disp: , Rfl: 1 .  olopatadine (PATANOL) 0.1 % ophthalmic solution, Place 1 drop into both eyes 2 (two) times daily., Disp: 5 mL, Rfl: 12 .  pantoprazole (PROTONIX) 40 MG tablet, TAKE 1 TABLET BY MOUTH EVERY DAY, Disp: 90 tablet, Rfl: 2 .  phentermine (ADIPEX-P) 37.5 MG tablet, Take half  Tablet by mouth before breakfast daily, Disp: 30 tablet, Rfl: 1 .  polyethylene glycol powder (GLYCOLAX/MIRALAX) powder, Take 17 grams (1 capful) dissolved in at least 8 ounces water juice and drink 3 times daily, Disp: 1530 g, Rfl: 11 .  potassium chloride 20 MEQ/15ML (10%) SOLN, Take 15 mLs (20 mEq total) by mouth 2 (two) times daily., Disp: 473 mL, Rfl: 1 .  solifenacin (VESICARE) 5 MG tablet, Take 1 tablet (5 mg total) by mouth daily., Disp: 30 tablet, Rfl: 3 .  triamterene-hydrochlorothiazide (MAXZIDE-25) 37.5-25 MG tablet, TAKE 1 TABLET BY MOUTH EVERY DAY, Disp: 90 tablet, Rfl: 1 .  Vitamin D, Ergocalciferol, (DRISDOL) 50000 units CAPS capsule, TAKE 1 CAPSULE (50,000 UNITS TOTAL) BY MOUTH ONCE A WEEK. (Patient taking differently: Take 50,000 Units by mouth every 7 (seven) days. ), Disp: 12 capsule, Rfl: 1 .  [START ON 08/20/2019] norgestimate-ethinyl estradiol  (ORTHO-CYCLEN, 28,) 0.25-35 MG-MCG tablet, Take 1 tablet by mouth daily. (Patient not taking: Reported on 08/17/2019), Disp: 1 Package, Rfl: 0 .  Norgestimate-Ethinyl Estradiol Triphasic (ORTHO TRI-CYCLEN, 28,) 0.18/0.215/0.25 MG-35 MCG tablet, Take one tablet three times daily by mouth for one week (Patient not taking: Reported on 08/17/2019), Disp: 1 Package, Rfl: 11   Social History: Reviewed -  reports that she has never smoked. She has never used smokeless tobacco.  Objective Findings:  Vitals: Blood pressure 128/82, pulse 92, height '5\' 6"'  (1.676 m), weight 177 lb (80.3 kg).  PHYSICAL EXAMINATION General appearance - alert, well appearing, and in no distress Mental status - alert, oriented to person, place, and time, normal mood, behavior, speech, dress, motor activity, and thought processes, anxious  PELVIC Patient given informed consent, signed copy in the chart, time out was performed. Appropriate time out taken. . The patient was placed in the lithotomy position and the cervix brought into view with sterile speculum.  Portio of cervix cleansed x 2 with betadine swabs.  A tenaculum was placed in the anterior lip of the cervix.  Cervix open, thinned out & atrophic  The uterus was sounded for depth of 9 cm. A pipelle was introduced to into the uterus, suction created,  and an endometrial sample was obtained. All equipment was removed and accounted for.  The patient tolerated the procedure well.   Patient given post procedure instructions. The patient will return in 2 weeks for results.   Assessment & Plan:   A:  1. ENDO Bx 2. DUB  P:  1. Call with results 2. [place], cbc    By signing my name below, I, Samul Dada, attest that this documentation has been prepared under the direction  and in the presence of Jonnie Kind, MD. Electronically Signed: [place]. 08/17/19. 3:37 PM.  I personally performed the services described in this documentation, which was  SCRIBED in my presence. The recorded information has been reviewed and considered accurate. It has been edited as necessary during review. Jonnie Kind, MD

## 2019-08-18 LAB — CBC
Hematocrit: 35 % (ref 34.0–46.6)
Hemoglobin: 12.3 g/dL (ref 11.1–15.9)
MCH: 31.4 pg (ref 26.6–33.0)
MCHC: 35.1 g/dL (ref 31.5–35.7)
MCV: 89 fL (ref 79–97)
Platelets: 315 10*3/uL (ref 150–450)
RBC: 3.92 x10E6/uL (ref 3.77–5.28)
RDW: 12.1 % (ref 11.7–15.4)
WBC: 5.7 10*3/uL (ref 3.4–10.8)

## 2019-09-03 ENCOUNTER — Other Ambulatory Visit: Payer: Self-pay | Admitting: Family Medicine

## 2019-09-03 ENCOUNTER — Encounter: Payer: Self-pay | Admitting: Family Medicine

## 2019-09-03 DIAGNOSIS — G894 Chronic pain syndrome: Secondary | ICD-10-CM

## 2019-09-10 ENCOUNTER — Encounter: Payer: Self-pay | Admitting: Family Medicine

## 2019-09-10 ENCOUNTER — Other Ambulatory Visit: Payer: Self-pay | Admitting: Family Medicine

## 2019-09-20 ENCOUNTER — Encounter: Payer: BC Managed Care – PPO | Attending: Family Medicine | Admitting: Nutrition

## 2019-09-20 ENCOUNTER — Other Ambulatory Visit: Payer: Self-pay

## 2019-09-20 DIAGNOSIS — E663 Overweight: Secondary | ICD-10-CM

## 2019-09-20 DIAGNOSIS — I10 Essential (primary) hypertension: Secondary | ICD-10-CM

## 2019-09-20 DIAGNOSIS — R7303 Prediabetes: Secondary | ICD-10-CM | POA: Insufficient documentation

## 2019-09-20 NOTE — Progress Notes (Signed)
Medical Nutrition Therapy:  Appt start time: 1300 end time:  [id number].  Assessment:  Primary concerns today: Overweight-weight gain., Pre DM. A1C 5.8%. History of breathing issues and asthma. She cooks  And shops  Eats 1 meal a day. Feels like she has gained 10-15 lbs. Prefers to weigh 160's.  Physical actviity;  Tries to walk daily but has pain and that limits her. Wants to lose weight and prevent having diabetes. Current diet is insuffient to meet her needs. High salt high processed food may be contributing to her inflammatory processes causing arthritis like pain. Stressed the needs to eat more of a plant base diet and avoid processed foods and preservatives. Avoid fast foods.  Lab Results  Component Value Date   HGBA1C 5.8 (H) 04/21/2019   CMP Latest Ref Rng & Units 04/21/2019 02/04/2019 12/18/2018  Glucose 65 - 99 mg/dL 90 84 95  BUN 7 - 25 mg/dL 11 10 12   Creatinine 0.50 - 1.05 mg/dL 1.00 1.06(H) 1.09  Sodium 135 - 146 mmol/L 139 136 138  Potassium 3.5 - 5.3 mmol/L 3.8 3.4(L) 4.0  Chloride 98 - 110 mmol/L 103 102 101  CO2 20 - 32 mmol/L 28 25 29   Calcium 8.6 - 10.4 mg/dL 9.3 9.3 9.4  Total Protein 6.1 - 8.1 g/dL 7.2 7.3 6.9  Total Bilirubin 0.2 - 1.2 mg/dL 0.4 0.6 0.5  Alkaline Phos 38 - 126 U/L - 48 -  AST 10 - 35 U/L 28 25 26   ALT 6 - 29 U/L 34(H) 39 30(H)   Lipid Panel     Component Value Date/Time   CHOL 183 04/21/2019 1450   TRIG 86 04/21/2019 1450   HDL 56 04/21/2019 1450   CHOLHDL 3.3 04/21/2019 1450   VLDL 26 06/09/2017 0846   LDLCALC 109 (H) 04/21/2019 1450     Preferred Learning Style:    No preference indicated   Learning Readiness:   Ready  Change in progress   MEDICATIONS:    DIETARY INTAKE:    24-hr recall:  B ( AM): SKips  Snk ( AM):  L ( PM): oodles and noodle and chicken. Snk ( PM):  D ( PM):  Snk ( PM): misc  Beverages: diet soda, tea or soda, water  Usual physical activity: labs   Estimated energy needs: 1200  calories 130 g  carbohydrates 90 g protein 33 g fat  Progress Towards Goal(s):  In progress.   Nutritional Diagnosis:  NB-1.1 Food and nutrition-related knowledge deficit As related to Pre Dm and weight gain.  As evidenced by BMI 28 and A1C 5.8%.    Intervention: Nutrition and Pre Diabetes education provided on My Plate, CHO counting, meal planning, portion sizes, timing of meals, avoiding snacks between meals unless having a low blood sugar, target ranges for A1C and blood sugars, signs/symptoms and treatment of hyper/hypoglycemia, monitoring blood sugars, taking medications as prescribed, benefits of exercising 30 -60 minutes per day and prevention of DM. Stressed the need for a plant based diet and avoiding processed foods and increase foods high in phytochemicals and more fresh fruits and vegetables.  Goals Follow My Plate  Eat three balanced meals at times discussed. Do not skip meals Drink only water Cut out snacks and processed foods and snacks. Cut out fast food, ramen noodle and packaged foods Increase fresh fruits and vegetables. Avoid hot dogs, bacon, sausage and other foods with nitrites and other preservatives.    Teaching Method Utilized:  Visual Auditory Hands on  Handouts given  during visit include:  The Plate Method   Meal Plan Card   Barriers to learning/adherence to lifestyle change: none  Demonstrated degree of understanding via:  Teach Back   Monitoring/Evaluation:  Dietary intake, exercise, and body weight in 1 month(s). She would benefit from seeing behavioral health for possible depression.

## 2019-09-21 ENCOUNTER — Encounter: Payer: Self-pay | Admitting: Family Medicine

## 2019-09-22 ENCOUNTER — Telehealth: Payer: Self-pay | Admitting: Obstetrics and Gynecology

## 2019-09-22 MED ORDER — MEDROXYPROGESTERONE ACETATE 10 MG PO TABS: 10.0000 mg | ORAL_TABLET | Freq: Every day | ORAL | 3 refills | Status: DC

## 2019-09-22 NOTE — Telephone Encounter (Signed)
Patient chose not to take the OCP's Rx'd by Dr Moshe Cipro last month.  Pt and I discussed ways to manage current irregular bleeding. Recent endometrial biopsy is showing benign proliferative endometrium.   My recommendation is periodic withdrawal with provera 10 mg / d x 14 days, to be started at q 3 months.

## 2019-09-23 ENCOUNTER — Encounter: Payer: Self-pay | Admitting: Nutrition

## 2019-09-23 NOTE — Patient Instructions (Signed)
Goals Follow My Plate  Eat three balanced meals at times discussed. Do not skip meals Drink only water Cut out snacks and processed foods and snacks. Cut out fast food, ramen noodle and packaged foods Increase fresh fruits and vegetables. Avoid hot dogs, bacon, sausage and other foods with nitrites and other preservatives.

## 2019-09-27 ENCOUNTER — Encounter: Payer: BC Managed Care – PPO | Admitting: Physical Medicine and Rehabilitation

## 2019-09-27 ENCOUNTER — Encounter: Payer: Self-pay | Admitting: Family Medicine

## 2019-09-27 ENCOUNTER — Encounter
Payer: BC Managed Care – PPO | Attending: Physical Medicine and Rehabilitation | Admitting: Physical Medicine and Rehabilitation

## 2019-09-27 ENCOUNTER — Telehealth: Payer: Self-pay

## 2019-09-27 ENCOUNTER — Encounter: Payer: Self-pay | Admitting: Physical Medicine and Rehabilitation

## 2019-09-27 ENCOUNTER — Other Ambulatory Visit: Payer: Self-pay

## 2019-09-27 VITALS — BP 116/77 | HR 89 | Temp 99.0°F | Ht 66.0 in | Wt 179.0 lb

## 2019-09-27 DIAGNOSIS — M961 Postlaminectomy syndrome, not elsewhere classified: Secondary | ICD-10-CM | POA: Insufficient documentation

## 2019-09-27 DIAGNOSIS — M5136 Other intervertebral disc degeneration, lumbar region: Secondary | ICD-10-CM | POA: Insufficient documentation

## 2019-09-27 DIAGNOSIS — Z79891 Long term (current) use of opiate analgesic: Secondary | ICD-10-CM | POA: Diagnosis not present

## 2019-09-27 DIAGNOSIS — G894 Chronic pain syndrome: Secondary | ICD-10-CM

## 2019-09-27 DIAGNOSIS — Z5181 Encounter for therapeutic drug level monitoring: Secondary | ICD-10-CM | POA: Diagnosis not present

## 2019-09-27 NOTE — Progress Notes (Signed)
Subjective:    Patient ID: Jane English, female    DOB: 11-03-1971, 48 y.o.   MRN: [id number]  HPI  48 year old woman with chronic pain after failed back surgery. She would like to be able to spend more time with her grandchildren, drive. Her pain was better controlled in the past but when she switched doctors her pain increased. Her pain is worst in the neck and she has tried Voltaren gel. She does have shooting pain into her lower legs.   Pain Inventory Average Pain 10 Pain Right Now 10 My pain is constant, sharp, burning, dull, stabbing, tingling and aching  In the last 24 hours, has pain interfered with the following? General activity 10 Relation with others 10 Enjoyment of life 10 What TIME of day is your pain at its worst? all Sleep (in general) NA  Pain is worse with: walking, sitting and standing Pain improves with: heat/ice Relief from Meds: not answered  Mobility how many minutes can you walk? 10-15 ability to climb steps?  yes do you drive?  yes  Function not employed: date last employed work place was shut down  Neuro/Psych bladder control problems bowel control problems numbness spasms  Prior Studies Any changes since last visit?  no  Physicians involved in your care Any changes since last visit?  no Primary care Tula Nakayama MD   Family History  Problem Relation Age of Onset  . Lung cancer Father   . Stomach cancer Father   . Esophageal cancer Father   . Alcohol abuse Father   . Mental illness Father   . Diabetes Sister   . Hypertension Sister   . Bipolar disorder Sister   . Schizophrenia Sister   . Diabetes Sister   . Alcohol abuse Brother   . Hypertension Brother   . Kidney disease Brother   . Diabetes Brother   . Drug abuse Brother   . Mental illness Brother   . Alcohol abuse Brother   . Alcohol abuse Brother   . Hypertension Brother   . Diabetes Brother   . Alcohol abuse Brother   . Physical abuse Mother   . Alcohol abuse  Mother   . Cirrhosis Mother   . Mental illness Brother        in [place]  . Drug abuse Sister   . HIV Sister   . Alcohol abuse Brother   . Pneumonia Sister        died as a baby  . Alcohol abuse Brother   . Bipolar disorder Brother   . Bipolar disorder Daughter   . Bipolar disorder Son   . Bipolar disorder Son   . Hypertension Brother   . Bipolar disorder Brother   . Drug abuse Brother   . Alcohol abuse Brother   . Bipolar disorder Brother   . ADD / ADHD Neg Hx   . Anxiety disorder Neg Hx   . Dementia Neg Hx   . Depression Neg Hx   . OCD Neg Hx   . Seizures Neg Hx   . Paranoid behavior Neg Hx   . Colon cancer Neg Hx    Social History   Socioeconomic History  . Marital status: Married    Spouse name: Not on file  . Number of children: 4  . Years of education: Not on file  . Highest education level: Not on file  Occupational History  . Not on file  Social Needs  . Financial resource strain: Not  on file  . Food insecurity    Worry: Not on file    Inability: Not on file  . Transportation needs    Medical: Not on file    Non-medical: Not on file  Tobacco Use  . Smoking status: Never Smoker  . Smokeless tobacco: Never Used  Substance and Sexual Activity  . Alcohol use: No  . Drug use: No  . Sexual activity: Yes    Partners: Male    Birth control/protection: Surgical    Comment: tubal  Lifestyle  . Physical activity    Days per week: Not on file    Minutes per session: Not on file  . Stress: Not on file  Relationships  . Social Herbalist on phone: Not on file    Gets together: Not on file    Attends religious service: Not on file    Active member of club or organization: Not on file    Attends meetings of clubs or organizations: Not on file    Relationship status: Not on file  Other Topics Concern  . Not on file  Social History Narrative  . Not on file   Past Surgical History:  Procedure Laterality Date  . ANTERIOR CERVICAL  DECOMP/DISCECTOMY FUSION  07/07/2012   Procedure: ANTERIOR CERVICAL DECOMPRESSION/DISCECTOMY FUSION 2 LEVELS;  Surgeon: Floyce Stakes, MD;  [facility]: [facility];  Service: Neurosurgery;  Laterality: N/A;  Cervical four-five, five - six  Anterior cervical decompression/diskectomy/fusion/plate  . APPENDECTOMY  1986  . BOWEL RESECTION N/A 07/29/2013   Procedure: serosal repair;  Surgeon: Adin Hector, MD;  Location: [facility];  Service: General;  Laterality: N/A;  . CARPAL TUNNEL RELEASE Bilateral   . LAPAROSCOPY N/A 07/29/2013   Procedure: diagnostic laporoscopy;  Surgeon: Adin Hector, MD;  Location: [facility];  Service: General;  Laterality: N/A;  . LAPAROSCOPY N/A 08/16/2013   Procedure: LAPAROSCOPY DIAGNOSTIC/LYSIS OF ADHESIONS;  Surgeon: Adin Hector, MD;  [facility]: [facility];  Service: General;  Laterality: N/A;  . LAPAROTOMY N/A 08/16/2013   Procedure: EXPLORATORY LAPAROTOMY/SMALL BOWEL RESECTION (JEJUNUM);  Surgeon: Adin Hector, MD;  [facility];  Service: General;  Laterality: N/A;  . LUMBAR SPINE SURGERY  2010   x 3  . LYSIS OF ADHESION  2003   Dr. Irving Shows  . LYSIS OF ADHESION N/A 07/29/2013   Procedure: LYSIS OF ADHESION;  Surgeon: Adin Hector, MD;  [facility]: [facility];  Service: General;  Laterality: N/A;  . OOPHORECTOMY    . PARTIAL HYSTERECTOMY  1990s?   [place], [place]  . SPINAL CORD STIMULATOR IMPLANT    . TRIGGER FINGER RELEASE  2009   right pinkie finger  . TUBAL LIGATION  1994   Past Medical History:  Diagnosis Date  . Anemia   . Asthma   . Asthma flare 04/09/2013  . Back pain   . Bronchitis   . Chronic abdominal pain   . Chronic constipation   . Constipation due to opioid therapy   . Depression   . Diabetes mellitus without complication ([place])   . Diabetes mellitus, type II ([place])   . DVT (deep venous thrombosis) ([place]) 2010  . GERD (gastroesophageal reflux disease)   . Heart murmur    no cardiologist  . Helicobacter pylori gastritis 06/11/2013    Colonoscopy Dr. Hilarie Fredrickson  . Hypertension   . IBS (irritable bowel syndrome)   . Migraine headache   . Neuropathy   . Obesity   .  Obsessive-compulsive disorder   . PSYCHOTIC D/O W/HALLUCINATIONS CONDS CLASS ELSW 03/04/2010   Qualifier: Diagnosis of  By: Moshe Cipro MD, Joycelyn Schmid    . PTSD (post-traumatic stress disorder)   . SBO (small bowel obstruction) ([place]) 08/09/2013  . Seasonal allergies 12/10/2012  . Seizures ([place])   . Shortness of breath    BP 116/77   Pulse 89   Temp 99 F (37.2 C)   Ht 5\' 6"  (1.676 m) Comment: last recorded  Wt 179 lb (81.2 kg) Comment: last recorded  SpO2 96%   BMI 28.89 kg/m   Opioid Risk Score:   Fall Risk Score:  `1  Depression screen PHQ 2/9  Depression screen [facility] 2/9 09/27/2019 09/23/2019 08/10/2019 04/21/2019 01/14/2019 08/27/2018 04/14/2018  Decreased Interest 0 2 0 0 0 3 3  Down, Depressed, Hopeless 0 2 0 0 0 3 3  PHQ - 2 Score 0 4 0 0 0 6 6  Altered sleeping 1 2 - - - 3 2  Tired, decreased energy 1 2 - - - 3 3  Change in appetite 0 2 - - - 2 1  Feeling bad or failure about yourself  0 1 - - - 1 3  Trouble concentrating 0 2 - - - 0 1  Moving slowly or fidgety/restless 0 0 - - - 0 0  Suicidal thoughts 0 0 - - - 0 0  PHQ-9 Score 2 13 - - - 15 16  Difficult doing work/chores Not difficult at all Somewhat difficult - - - Very difficult Very difficult  Some encounter information is confidential and restricted. Go to Review Flowsheets activity to see all data.  Some recent data might be hidden    Review of Systems  Constitutional: Positive for unexpected weight change.  HENT: Negative.   Eyes: Negative.   Respiratory: Positive for wheezing.   Gastrointestinal: Positive for constipation.  Endocrine: Negative.   Genitourinary: Positive for urgency.       Urgency  Musculoskeletal: Positive for back pain and neck pain.       Spasms  Skin: Negative.   Allergic/Immunologic: Negative.   Neurological: Positive for numbness.  Hematological: Negative.    Psychiatric/Behavioral: Negative.   All other systems reviewed and are negative.      Objective:   Physical Exam  Gen: no distress, normal appearing HEENT: oral mucosa pink and moist, NCAT Cardio: Reg rate Chest: normal effort, normal rate of breathing Abd: soft, non-distended Ext: no edema Skin: intact Neuro: AOx3 Follows commands.  MSK: Normal gait and tandem walk. Full ROM of lumbar spine on flexion, limited in extension. Both flexion and extension result in pain. +Slump test on the left, which produces pain down the leg to the calf in the S1 distribution.  Psych: pleasant, normal affect      Assessment & Plan:  48 year old woman with chronic pain after failed back surgery.  --She has severe pain, worse in her lower back and cervical spine.  -Reviewed MRI lumbar spine from 2013 with shows the following: 1.  Stable appearance of the lumbar spine status post L5-S1 PLIF. 2.  No significant adjacent segment disease, disc herniation, acquired spinal stenosis or nerve root encroachment. Mild facet degenerative changes inferiorly are stable. 3.  No abnormal intradural enhancement.   --She has been on Oxycodone in the past and had functional benefits. She was able to walk more , about 2 miles per day. She as tried surgery, injections, and neuropathic pain medications without relief or with negative  side effects. We will obtain UDS before prescribing opioids. Discussed that our goals are functional improvement and not long term dependence on oxycodone. Discussed creating a daily exercise regimen for pain control and overall health.   --Provided with referral to [facility]: Referral to Dr. Clydell Hakim for patient s/p L5-S1 PLIF now with an inactive pain stimulation system, implanted 10/30/12 Serial: ID:[id number]. She would benefit from reactivation of this stimulation system.  Forty-five minutes of face to face patient care time were spent during this visit.  All questions were encouraged and answered. Follow up with me in 4 weeks.

## 2019-09-27 NOTE — Telephone Encounter (Signed)
I spoke with Pt this AM, after receiving the MyChart notification, and calling Raquel Sarna with Dr Alessandra Grout office, and there still has not been a decision made on the pt.  Pt is in a lot of pain and upset that she has not heard anything from Dr Alessandra Grout office. I apologized and advised that I would send another referral out to another Dr, Pt requested referral to [facility] center for Pain and rehab. There was also mention of Dr Mirna Mires, I sent a referral also to that provider, trying to get an appointment for our patient.  Pt also mentioned that she does not want any communication from [place] and wanted me to note this on her chart.

## 2019-09-28 NOTE — Addendum Note (Signed)
Addended by: Izora Ribas on: 09/28/2019 09:35 AM   Modules accepted: Orders

## 2019-10-01 LAB — TOXASSURE SELECT,+ANTIDEPR,UR

## 2019-10-04 ENCOUNTER — Telehealth: Payer: Self-pay | Admitting: *Deleted

## 2019-10-04 NOTE — Telephone Encounter (Signed)
Urine drug screen is positive for her prescribed alprazolam, but it is also positive for THC.

## 2019-10-05 ENCOUNTER — Telehealth: Payer: Self-pay

## 2019-10-05 NOTE — Telephone Encounter (Signed)
Only recommendation is that she reach out again to that Dr and see if under any circumstance he/ she is willing to manage her pain, and if there was any opportunity to start over with pain management there. She should be open to non narcotic pain management like gabapentin and lyrica also, and she would get that from pain  management

## 2019-10-05 NOTE — Telephone Encounter (Signed)
Pt is calling upset that the pain Dr, Dr Ranell Patrick will not give her any pain medication, as THC was found in her Urine. Pt states that she was honest with the Dr and told her she had taken an edible, as her son suggested that she take it for the pain. It helped a little but not really.  I advised the pt that any Dr that prescribes narcotics, will do a urine test and test to see if ANY other drug is present in the Urine, and if there is there would not be any Narcotics prescribed.  The Pt states she is not addicted, and is not a drug head.  Wanted to see if Dr Moshe Cipro had any suggestion on what she should do, as its been a long time before she could get in to see the pain dr.

## 2019-10-06 NOTE — Telephone Encounter (Signed)
Responded to patient via mychart

## 2019-10-18 NOTE — Telephone Encounter (Signed)
Called patient back per request

## 2019-10-24 ENCOUNTER — Encounter: Payer: Self-pay | Admitting: Family Medicine

## 2019-10-26 ENCOUNTER — Other Ambulatory Visit: Payer: Self-pay | Admitting: Physical Medicine and Rehabilitation

## 2019-10-26 ENCOUNTER — Other Ambulatory Visit: Payer: Self-pay | Admitting: Family Medicine

## 2019-10-26 DIAGNOSIS — M2550 Pain in unspecified joint: Secondary | ICD-10-CM

## 2019-10-26 MED ORDER — PREDNISONE 5 MG PO TABS: 5.0000 mg | ORAL_TABLET | Freq: Two times a day (BID) | ORAL | 0 refills | Status: AC

## 2019-10-26 NOTE — Progress Notes (Signed)
pred 

## 2019-10-27 ENCOUNTER — Encounter: Payer: Medicare HMO | Attending: Physical Medicine and Rehabilitation | Admitting: Physical Medicine and Rehabilitation

## 2019-10-27 ENCOUNTER — Other Ambulatory Visit: Payer: Self-pay

## 2019-10-27 ENCOUNTER — Encounter: Payer: Self-pay | Admitting: Physical Medicine and Rehabilitation

## 2019-10-27 DIAGNOSIS — M542 Cervicalgia: Secondary | ICD-10-CM

## 2019-10-27 DIAGNOSIS — M545 Low back pain: Secondary | ICD-10-CM

## 2019-10-27 DIAGNOSIS — M7918 Myalgia, other site: Secondary | ICD-10-CM

## 2019-10-27 DIAGNOSIS — G894 Chronic pain syndrome: Secondary | ICD-10-CM | POA: Insufficient documentation

## 2019-10-27 DIAGNOSIS — G8929 Other chronic pain: Secondary | ICD-10-CM

## 2019-10-27 DIAGNOSIS — Z79891 Long term (current) use of opiate analgesic: Secondary | ICD-10-CM | POA: Insufficient documentation

## 2019-10-27 DIAGNOSIS — Z5181 Encounter for therapeutic drug level monitoring: Secondary | ICD-10-CM | POA: Insufficient documentation

## 2019-10-27 DIAGNOSIS — M961 Postlaminectomy syndrome, not elsewhere classified: Secondary | ICD-10-CM | POA: Insufficient documentation

## 2019-10-27 DIAGNOSIS — M5136 Other intervertebral disc degeneration, lumbar region: Secondary | ICD-10-CM | POA: Insufficient documentation

## 2019-10-27 MED ORDER — AMITRIPTYLINE HCL 150 MG PO TABS: 75.0000 mg | ORAL_TABLET | Freq: Every day | ORAL | 1 refills | Status: DC

## 2019-10-27 NOTE — Progress Notes (Signed)
Subjective:    Patient ID: Jane English, female    DOB: 06-14-1971, 48 y.o.   MRN: [id number]  HPI  48 year old woman with chronic pain after failed back surgery. She would like to be able to spend more time with her grandchildren, drive. Her pain was better controlled in the past but when she switched doctors her pain increased. Her pain is worst in the neck and she has tried Voltaren gel. She does have shooting pain into her lower legs.   She has an allergy to Gabapentin. She was prescribed steroids to help reduce inflammatory pain but she does not like taking them because they make her gain weight.   She obtained a repeat UDS yesterday as her prior screen was + for [facility]. She stated that she took marijuana bites that did help her pain.   Pain Inventory Average Pain 10 Pain Right Now 10 My pain is constant, sharp, burning, dull, stabbing, tingling, aching and numbness  In the last 24 hours, has pain interfered with the following? General activity 10 Relation with others 10 Enjoyment of life 10 What TIME of day is your pain at its worst? all Sleep (in general) Poor  Pain is worse with: walking, sitting and standing Pain improves with: heat/ice Relief from Meds: 0  Mobility how many minutes can you walk? 10-15 ability to climb steps?  yes do you drive?  yes  Function disabled: date disabled . did have part time job but it was closed due to pandemic  Neuro/Psych bowel control problems numbness trouble walking spasms  Prior Studies Any changes since last visit?  no  Has not been seen about her nerve stimulator.  Physicians involved in your care Any changes since last visit?  no   Family History  Problem Relation Age of Onset  . Lung cancer Father   . Stomach cancer Father   . Esophageal cancer Father   . Alcohol abuse Father   . Mental illness Father   . Diabetes Sister   . Hypertension Sister   . Bipolar disorder Sister   . Schizophrenia Sister   . Diabetes  Sister   . Alcohol abuse Brother   . Hypertension Brother   . Kidney disease Brother   . Diabetes Brother   . Drug abuse Brother   . Mental illness Brother   . Alcohol abuse Brother   . Alcohol abuse Brother   . Hypertension Brother   . Diabetes Brother   . Alcohol abuse Brother   . Physical abuse Mother   . Alcohol abuse Mother   . Cirrhosis Mother   . Mental illness Brother        in [place]  . Drug abuse Sister   . HIV Sister   . Alcohol abuse Brother   . Pneumonia Sister        died as a baby  . Alcohol abuse Brother   . Bipolar disorder Brother   . Bipolar disorder Daughter   . Bipolar disorder Son   . Bipolar disorder Son   . Hypertension Brother   . Bipolar disorder Brother   . Drug abuse Brother   . Alcohol abuse Brother   . Bipolar disorder Brother   . ADD / ADHD Neg Hx   . Anxiety disorder Neg Hx   . Dementia Neg Hx   . Depression Neg Hx   . OCD Neg Hx   . Seizures Neg Hx   . Paranoid behavior Neg Hx   .  Colon cancer Neg Hx    Social History   Socioeconomic History  . Marital status: Married    Spouse name: Not on file  . Number of children: 4  . Years of education: Not on file  . Highest education level: Not on file  Occupational History  . Not on file  Tobacco Use  . Smoking status: Never Smoker  . Smokeless tobacco: Never Used  Substance and Sexual Activity  . Alcohol use: No  . Drug use: No  . Sexual activity: Yes    Partners: Male    Birth control/protection: Surgical    Comment: tubal  Other Topics Concern  . Not on file  Social History Narrative  . Not on file   Social Determinants of Health   Financial Resource Strain:   . Difficulty of Paying Living Expenses: Not on file  Food Insecurity:   . Worried About Charity fundraiser in the Last Year: Not on file  . Ran Out of Food in the Last Year: Not on file  Transportation Needs:   . Lack of Transportation (Medical): Not on file  . Lack of Transportation (Non-Medical): Not on file   Physical Activity:   . Days of Exercise per Week: Not on file  . Minutes of Exercise per Session: Not on file  Stress:   . Feeling of Stress : Not on file  Social Connections:   . Frequency of Communication with Friends and Family: Not on file  . Frequency of Social Gatherings with Friends and Family: Not on file  . Attends Religious Services: Not on file  . Active Member of Clubs or Organizations: Not on file  . Attends Archivist Meetings: Not on file  . Marital Status: Not on file   Past Surgical History:  Procedure Laterality Date  . ANTERIOR CERVICAL DECOMP/DISCECTOMY FUSION  07/07/2012   Procedure: ANTERIOR CERVICAL DECOMPRESSION/DISCECTOMY FUSION 2 LEVELS;  Surgeon: Floyce Stakes, MD;  Location: [facility];  Service: Neurosurgery;  Laterality: N/A;  Cervical four-five, five - six  Anterior cervical decompression/diskectomy/fusion/plate  . APPENDECTOMY  1986  . BOWEL RESECTION N/A 07/29/2013   Procedure: serosal repair;  Surgeon: Adin Hector, MD;  Location: [facility];  Service: General;  Laterality: N/A;  . CARPAL TUNNEL RELEASE Bilateral   . LAPAROSCOPY N/A 07/29/2013   Procedure: diagnostic laporoscopy;  Surgeon: Adin Hector, MD;  Location: [facility];  Service: General;  Laterality: N/A;  . LAPAROSCOPY N/A 08/16/2013   Procedure: LAPAROSCOPY DIAGNOSTIC/LYSIS OF ADHESIONS;  Surgeon: Adin Hector, MD;  Location: [facility];  Service: General;  Laterality: N/A;  . LAPAROTOMY N/A 08/16/2013   Procedure: EXPLORATORY LAPAROTOMY/SMALL BOWEL RESECTION (JEJUNUM);  Surgeon: Adin Hector, MD;  Location: [facility];  Service: General;  Laterality: N/A;  . LUMBAR SPINE SURGERY  2010   x 3  . LYSIS OF ADHESION  2003   Dr. Irving Shows  . LYSIS OF ADHESION N/A 07/29/2013   Procedure: LYSIS OF ADHESION;  Surgeon: Adin Hector, MD;  Location: [facility];  Service: General;  Laterality: N/A;  . OOPHORECTOMY    . PARTIAL HYSTERECTOMY  1990s?   [place], [place]  . SPINAL CORD  STIMULATOR IMPLANT    . TRIGGER FINGER RELEASE  2009   right pinkie finger  . TUBAL LIGATION  1994   Past Medical History:  Diagnosis Date  . Anemia   . Asthma   . Asthma flare 04/09/2013  . Back pain   .  Bronchitis   . Chronic abdominal pain   . Chronic constipation   . Constipation due to opioid therapy   . Depression   . Diabetes mellitus without complication ([place])   . Diabetes mellitus, type II ([place])   . DVT (deep venous thrombosis) ([place]) 2010  . GERD (gastroesophageal reflux disease)   . Heart murmur    no cardiologist  . Helicobacter pylori gastritis 06/11/2013   Colonoscopy Dr. Hilarie Fredrickson  . Hypertension   . IBS (irritable bowel syndrome)   . Migraine headache   . Neuropathy   . Obesity   . Obsessive-compulsive disorder   . PSYCHOTIC D/O W/HALLUCINATIONS CONDS CLASS ELSW 03/04/2010   Qualifier: Diagnosis of  By: Moshe Cipro MD, Joycelyn Schmid    . PTSD (post-traumatic stress disorder)   . SBO (small bowel obstruction) ([place]) 08/09/2013  . Seasonal allergies 12/10/2012  . Seizures ([place])   . Shortness of breath    There were no vitals taken for this visit.  Opioid Risk Score:   Fall Risk Score:  `1  Depression screen PHQ 2/9  Depression screen [facility] 2/9 10/27/2019 09/27/2019 09/23/2019 08/10/2019 04/21/2019 01/14/2019 08/27/2018  Decreased Interest 0 0 2 0 0 0 3  Down, Depressed, Hopeless 0 0 2 0 0 0 3  PHQ - 2 Score 0 0 4 0 0 0 6  Altered sleeping - 1 2 - - - 3  Tired, decreased energy - 1 2 - - - 3  Change in appetite - 0 2 - - - 2  Feeling bad or failure about yourself  - 0 1 - - - 1  Trouble concentrating - 0 2 - - - 0  Moving slowly or fidgety/restless - 0 0 - - - 0  Suicidal thoughts - 0 0 - - - 0  PHQ-9 Score - 2 13 - - - 15  Difficult doing work/chores - Not difficult at all Somewhat difficult - - - Very difficult  Some encounter information is confidential and restricted. Go to Review Flowsheets activity to see all data.  Some recent data might be hidden   Review of  Systems  Constitutional: Negative.   HENT: Negative.   Eyes: Negative.   Respiratory: Negative.   Cardiovascular: Negative.   Gastrointestinal: Positive for constipation.  Genitourinary: Negative.   Musculoskeletal: Positive for back pain.       Shoulder pain  Skin: Negative.   Allergic/Immunologic: Negative.   Neurological: Positive for numbness.  Hematological: Negative.   Psychiatric/Behavioral: Negative.   All other systems reviewed and are negative.      Objective:   Physical Exam  Not performed today as patient was on Webex visit.        Assessment & Plan:  49 year old woman with chronic pain after failed back surgery.  --She has severe pain, worse in her lower back and cervical spine.  -Reviewed MRI lumbar spine from 2013 with shows the following: 1. Stable appearance of the lumbar spine status post L5-S1 PLIF. 2. No significant adjacent segment disease, disc herniation, acquired spinal stenosis or nerve root encroachment. Mild facet degenerative changes inferiorly are stable. 3. No abnormal intradural enhancement.   --She has been on Oxycodone in the past and had functional benefits. She was able to walk more , about 2 miles per day. She as tried surgery, injections, and neuropathic pain medications without relief or with negative side effects. We will obtain UDS before prescribing opioids. Discussed that our goals are functional improvement and not long term dependence  on oxycodone. Discussed creating a daily exercise regimen for pain control and overall health.   --Provided with referral to [facility]: Referral to Dr. Clydell Hakim for patient s/p L5-S1 PLIF now with an inactive pain stimulation system, implanted 10/30/12 Serial: ID:[id number]. She would benefit from reactivation of this stimulation system.  --Physical therapy for postural correction, cervical ROM, strengthening of muscles of neck and upper back, development of  HEP  15 minutes of face to face patient care time were spent during this visit. All questions were encouraged and answered. Follow up with me in 4 weeks.

## 2019-10-29 LAB — TOXASSURE SELECT,+ANTIDEPR,UR

## 2019-11-03 ENCOUNTER — Telehealth: Payer: Self-pay | Admitting: *Deleted

## 2019-11-03 NOTE — Telephone Encounter (Signed)
Urine drug screen for this encounter is consistent for prescribed medication alprazolam. There is a presence of oxazepam. Oxazepam may be administered as a scheduled prescription medication; it is also an expected metabolite of other benzodiazepine drugs,including diazepam, chlordiazepoxide, prazepam, clorazepate,  halazepam, and temazepam. In looking at the PMP, she has been prescribed temazepam 03/2018, so this may be the source. This screen was done independent of a visit so no record of medications taken available.

## 2019-11-04 ENCOUNTER — Other Ambulatory Visit: Payer: Self-pay | Admitting: Physical Medicine and Rehabilitation

## 2019-11-04 MED ORDER — OXYCODONE HCL 5 MG PO TABS: 10.0000 mg | ORAL_TABLET | Freq: Two times a day (BID) | ORAL | 0 refills | Status: DC

## 2019-11-10 ENCOUNTER — Other Ambulatory Visit: Payer: Self-pay

## 2019-11-10 DIAGNOSIS — Z20822 Contact with and (suspected) exposure to covid-19: Secondary | ICD-10-CM

## 2019-11-12 LAB — NOVEL CORONAVIRUS, NAA: SARS-CoV-2, NAA: NOT DETECTED

## 2019-11-15 ENCOUNTER — Ambulatory Visit: Payer: BC Managed Care – PPO | Attending: Family Medicine | Admitting: Physical Therapy

## 2019-11-15 ENCOUNTER — Other Ambulatory Visit: Payer: Self-pay

## 2019-11-15 DIAGNOSIS — R293 Abnormal posture: Secondary | ICD-10-CM | POA: Insufficient documentation

## 2019-11-15 DIAGNOSIS — M62838 Other muscle spasm: Secondary | ICD-10-CM | POA: Diagnosis not present

## 2019-11-15 DIAGNOSIS — M542 Cervicalgia: Secondary | ICD-10-CM

## 2019-11-15 NOTE — Therapy (Signed)
[place], [place], [address] Phone: [phone]   Fax:  [phone]  Physical Therapy Evaluation  Patient Details  Name: Jane English MRN: [id number] Date of Birth: [birth year] Referring Provider (PT): Leeroy Cha    Encounter Date: 11/15/2019  PT End of Session - 11/15/19 1526    Visit Number  1    Number of Visits  6    Date for PT Re-Evaluation  12/27/19    Authorization Type  BCBS    PT Start Time  1505    PT Stop Time  1545    PT Time Calculation (min)  40 min    Activity Tolerance  Patient tolerated treatment well    Behavior During Therapy  [facility] for tasks assessed/performed       Past Medical History:  Diagnosis Date  . Anemia   . Asthma   . Asthma flare 04/09/2013  . Back pain   . Bronchitis   . Chronic abdominal pain   . Chronic constipation   . Constipation due to opioid therapy   . Depression   . Diabetes mellitus without complication ([place])   . Diabetes mellitus, type II ([place])   . DVT (deep venous thrombosis) ([place]) 2010  . GERD (gastroesophageal reflux disease)   . Heart murmur    no cardiologist  . Helicobacter pylori gastritis 06/11/2013   Colonoscopy Dr. Hilarie Fredrickson  . Hypertension   . IBS (irritable bowel syndrome)   . Migraine headache   . Neuropathy   . Obesity   . Obsessive-compulsive disorder   . PSYCHOTIC D/O W/HALLUCINATIONS CONDS CLASS ELSW 03/04/2010   Qualifier: Diagnosis of  By: Moshe Cipro MD, Joycelyn Schmid    . PTSD (post-traumatic stress disorder)   . SBO (small bowel obstruction) ([place]) 08/09/2013  . Seasonal allergies 12/10/2012  . Seizures ([place])   . Shortness of breath     Past Surgical History:  Procedure Laterality Date  . ANTERIOR CERVICAL DECOMP/DISCECTOMY FUSION  07/07/2012   Procedure: ANTERIOR CERVICAL DECOMPRESSION/DISCECTOMY FUSION 2 LEVELS;  Surgeon: Floyce Stakes, MD;  Location: [facility];  Service: Neurosurgery;  Laterality: N/A;  Cervical four-five,  five - six  Anterior cervical decompression/diskectomy/fusion/plate  . APPENDECTOMY  1986  . BOWEL RESECTION N/A 07/29/2013   Procedure: serosal repair;  Surgeon: Adin Hector, MD;  Location: [facility];  Service: General;  Laterality: N/A;  . CARPAL TUNNEL RELEASE Bilateral   . LAPAROSCOPY N/A 07/29/2013   Procedure: diagnostic laporoscopy;  Surgeon: Adin Hector, MD;  Location: [facility];  Service: General;  Laterality: N/A;  . LAPAROSCOPY N/A 08/16/2013   Procedure: LAPAROSCOPY DIAGNOSTIC/LYSIS OF ADHESIONS;  Surgeon: Adin Hector, MD;  Location: [facility];  Service: General;  Laterality: N/A;  . LAPAROTOMY N/A 08/16/2013   Procedure: EXPLORATORY LAPAROTOMY/SMALL BOWEL RESECTION (JEJUNUM);  Surgeon: Adin Hector, MD;  Location: [facility];  Service: General;  Laterality: N/A;  . LUMBAR SPINE SURGERY  2010   x 3  . LYSIS OF ADHESION  2003   Dr. Irving Shows  . LYSIS OF ADHESION N/A 07/29/2013   Procedure: LYSIS OF ADHESION;  Surgeon: Adin Hector, MD;  Location: [facility];  Service: General;  Laterality: N/A;  . OOPHORECTOMY    . PARTIAL HYSTERECTOMY  1990s?   [place], [place]  . SPINAL CORD STIMULATOR IMPLANT    . TRIGGER FINGER RELEASE  2009   right pinkie finger  . TUBAL LIGATION  1994    There were no  vitals filed for this visit.   Subjective Assessment - 11/15/19 1517    Subjective  Patient has a long history of neck and lower back pain. Her neck has been hurting her more recently. She has increased pain when she moves her head. The pain comes and goes but when it comes it can be a high level. She has had PT in the past without success.    Limitations  Other (comment)    How long can you sit comfortably?  no limit    How long can you stand comfortably?  no limit    How long can you walk comfortably?  no limit    Currently in Pain?  Yes    Pain Score  7     Pain Orientation  Right;Left    Pain Descriptors / Indicators  Aching;Stabbing    Pain Type  Chronic pain    Pain Radiating  Towards  radiates into her bilateral arms.    Pain Onset  More than a month ago    Pain Frequency  Constant    Aggravating Factors   turning her head    Pain Relieving Factors  heating pad,    Effect of Pain on Daily Activities  pain all the time         [facility] PT Assessment - 11/15/19 0001      Assessment   Medical Diagnosis  Cevical spine pain     Referring Provider (PT)  Ranell Patrick, Martha Clan     Onset Date/Surgical Date  --   several years    Hand Dominance  Right    Next MD Visit  11/25/2018     Prior Therapy  Had therapy for her neck before.       Precautions   Precautions  None      Restrictions   Weight Bearing Restrictions  No      Balance Screen   Has the patient fallen in the past 6 months  Yes    How many times?  --   2 falls; leg gave out    Has the patient had a decrease in activity level because of a fear of falling?   No    Is the patient reluctant to leave their home because of a fear of falling?   No      Home Film/video editor residence      Prior Function   Level of Independence  Independent    Vocation  Part time employment    Vocation Requirements  laid off at this time 2nd to covid     Leisure  walking       Cognition   Overall Cognitive Status  Within Functional Limits for tasks assessed    Attention  Focused    Focused Attention  Appears intact    Memory  Appears intact    Awareness  Appears intact    Problem Solving  Appears intact      Observation/Other Assessments   Focus on Therapeutic Outcomes (FOTO)   49% limitation       Sensation   Light Touch  Appears Intact    Additional Comments  numbness into her hands       Coordination   Gross Motor Movements are Fluid and Coordinated  Yes    Fine Motor Movements are Fluid and Coordinated  Yes      ROM / Strength   AROM / PROM / Strength  AROM;PROM;Strength  AROM   Overall AROM Comments  bilateral shoulder flexion to 90 degrees with pain     AROM Assessment  Site  Cervical    Cervical Flexion  14    Cervical Extension  8    Cervical - Right Rotation  41    Cervical - Left Rotation  30       Strength   Strength Assessment Site  Shoulder;Hand    Right/Left Shoulder  Left;Right    Right Shoulder Flexion  4/5    Right Shoulder Internal Rotation  4/5    Right Shoulder External Rotation  4/5    Left Shoulder Flexion  4/5    Left Shoulder Internal Rotation  4/5    Left Shoulder External Rotation  4/5    Right/Left hand  Right;Left    Right Hand Grip (lbs)  10    Left Hand Grip (lbs)  10      Palpation   Palpation comment  tendenress to palpation in the upper trap and cervical spine                 Objective measurements completed on examination: See above findings.      [facility] PT Treatment/Exercise - 11/15/19 0001      Exercises   Exercises  Neck      Manual Therapy   Manual Therapy  Soft tissue mobilization;Manual Traction    Soft tissue mobilization  to upper traps and cervical spine     Manual Traction  sub-occipital release and decompression       Neck Exercises: Stretches   Upper Trapezius Stretch Limitations  2x20 sec hold bilateral with max cuing     Levator Stretch Limitations  2x20 sec hold with max cuing     Other Neck Stretches  scap retraction x10     Other Neck Stretches  cervical rotation 3x in poain free range mod cuing              PT Education - 11/15/19 1526    Education Details  reviewed HEp and symptom mangement    Person(s) Educated  Patient    Methods  Explanation;Demonstration;Tactile cues;Verbal cues;Handout    Comprehension  Verbalized understanding;Returned demonstration;Verbal cues required;Tactile cues required;Need further instruction       PT Short Term Goals - 11/15/19 1612      PT SHORT TERM GOAL #1   Title  Patient will increase cervical flexion and extension by 5 degrees    Time  3    Period  Weeks    Status  New    Target Date  12/06/19      PT SHORT TERM GOAL  #2   Title  Patient will increase bilateral grip strength by 5 lbs    Time  3    Period  Weeks    Status  New    Target Date  12/06/19      PT SHORT TERM GOAL #3   Title  Patient will increase bilateral cervical rotation by 15 degrees    Time  3    Period  Weeks    Status  New    Target Date  12/06/19        PT Long Term Goals - 11/15/19 1613      PT LONG TERM GOAL #1   Title  Patient will reach overhead without pulling and increased pain    Time  6    Period  Weeks    Status  New  Target Date  12/27/19      PT LONG TERM GOAL #2   Title  Patient will be independent with complete HEP    Time  6    Period  Weeks    Status  New    Target Date  12/27/19      PT LONG TERM GOAL #3   Title  Patient will increase to 60 cervical rotation to improve ability to drive without pain    Time  6    Period  Weeks    Status  New    Target Date  12/27/19             Plan - 11/15/19 1527    Clinical Impression Statement  Patient is a 49 year old female who presents with cervical spine pain that radiates into bilateral UE's. She has increased pain with all cervial movement but left rotation the most. She has done PT in the past. She has some exercises she still does. She had a cervical C3-C4 and C5-C6 fusion in 2013. She has significant spasming in the upper traps and into the peri-scpaular area. She would benefit from skilled therapy to improve movement and decrease spasming.    Personal Factors and Comorbidities  Comorbidity 1;Comorbidity 3+;Comorbidity 2    Comorbidities  OCD, ACDF of C2-C#; Multi leval disc degeneration, PTSD    Examination-Activity Limitations  Carry;Lift;Reach Overhead    Examination-Participation Restrictions  Shop;Cleaning;Meal Prep;Medication Management    Stability/Clinical Decision Making  Evolving/Moderate complexity    Clinical Decision Making  Moderate    Rehab Potential  Good    PT Frequency  1x / week    PT Duration  6 weeks    PT  Treatment/Interventions  ADLs/Self Care Home Management;Electrical Stimulation;Cryotherapy;Iontophoresis 4mg /ml Dexamethasone;Moist Heat;Traction;Ultrasound;Therapeutic exercise;Therapeutic activities;Neuromuscular re-education;Patient/family education;Manual techniques    PT Next Visit Plan  soft tissue mobilization; consder dry needling if she is open to it. IATYM to traps if able to tolerate; begin postural correction;    PT Home Exercise Plan  upper trap stretch; levator stretch, scap retraction; cervical rotation x3 bilateral    Consulted and Agree with Plan of Care  Patient       Patient will benefit from skilled therapeutic intervention in order to improve the following deficits and impairments:  Pain, Decreased range of motion, Impaired UE functional use, Increased muscle spasms, Decreased activity tolerance, Decreased strength, Postural dysfunction  Visit Diagnosis: Cervicalgia  Other muscle spasm  Abnormal posture     Problem List Patient Active Problem List   Diagnosis Date Noted  . Urinary urgency 08/11/2019  . Overweight (BMI 25.0-29.9) 04/26/2019  . GAD (generalized anxiety disorder) 04/26/2019  . Depression, major, single episode, severe ([place]) 10/03/2018  . Vitamin D deficiency 12/14/2017  . Bilateral ankle pain 04/16/2017  . Amenorrhea 10/15/2016  . Paresthesia 05/06/2016  . Back pain with radiation 04/04/2016  . Neuropathic pain of finger of right hand 12/31/2015  . Pap smear of cervix shows high risk HPV present 08/08/2015  . Cyst of left ovary 02/06/2015  . Anovulation 02/06/2015  . Secondary amenorrhea 02/06/2015  . Intractable chronic migraine without aura and without status migrainosus 12/21/2014  . Migraine 09/21/2014  . Anemia 10/11/2013  . H. pylori duodenitis 07/07/2013  . PTSD (post-traumatic stress disorder) 03/26/2013  . Insomnia 03/18/2013  . Seasonal allergies 12/10/2012  . GERD (gastroesophageal reflux disease) 10/29/2012  . Chronic pain  syndrome 10/06/2012  . Postlaminectomy syndrome 10/06/2012  . Cervical neck pain with evidence of  disc disease 04/13/2012  . Allergic rhinitis 04/17/2010  . Cough variant asthma 03/04/2010  . Prediabetes 01/05/2009  . Essential hypertension 11/27/2007    Carney Living  PT DPT  11/15/2019, 4:39 PM  [facility] [address] [place], [place], [address] Phone: [phone]   Fax:  [phone]  Name: SENEQUA BUFFETT MRN: [id number] Date of Birth: 04/17/1971

## 2019-11-17 ENCOUNTER — Encounter: Payer: Self-pay | Admitting: Family Medicine

## 2019-11-18 ENCOUNTER — Ambulatory Visit: Payer: BC Managed Care – PPO | Admitting: Nutrition

## 2019-11-23 ENCOUNTER — Other Ambulatory Visit: Payer: Self-pay

## 2019-11-23 ENCOUNTER — Ambulatory Visit: Payer: BC Managed Care – PPO | Admitting: Physical Therapy

## 2019-11-23 DIAGNOSIS — M542 Cervicalgia: Secondary | ICD-10-CM | POA: Diagnosis not present

## 2019-11-23 DIAGNOSIS — R293 Abnormal posture: Secondary | ICD-10-CM

## 2019-11-23 DIAGNOSIS — M62838 Other muscle spasm: Secondary | ICD-10-CM | POA: Diagnosis not present

## 2019-11-24 ENCOUNTER — Encounter: Payer: Self-pay | Admitting: Physical Medicine and Rehabilitation

## 2019-11-24 ENCOUNTER — Encounter: Payer: Self-pay | Admitting: Physical Therapy

## 2019-11-24 ENCOUNTER — Encounter
Payer: BC Managed Care – PPO | Attending: Physical Medicine and Rehabilitation | Admitting: Physical Medicine and Rehabilitation

## 2019-11-24 ENCOUNTER — Other Ambulatory Visit: Payer: Self-pay

## 2019-11-24 VITALS — BP 148/90 | HR 120 | Temp 97.8°F | Ht 66.0 in | Wt 192.0 lb

## 2019-11-24 DIAGNOSIS — M961 Postlaminectomy syndrome, not elsewhere classified: Secondary | ICD-10-CM | POA: Diagnosis not present

## 2019-11-24 DIAGNOSIS — Z5181 Encounter for therapeutic drug level monitoring: Secondary | ICD-10-CM | POA: Insufficient documentation

## 2019-11-24 DIAGNOSIS — G894 Chronic pain syndrome: Secondary | ICD-10-CM | POA: Diagnosis not present

## 2019-11-24 DIAGNOSIS — M5136 Other intervertebral disc degeneration, lumbar region: Secondary | ICD-10-CM | POA: Diagnosis not present

## 2019-11-24 DIAGNOSIS — Z79891 Long term (current) use of opiate analgesic: Secondary | ICD-10-CM | POA: Diagnosis not present

## 2019-11-24 DIAGNOSIS — M7918 Myalgia, other site: Secondary | ICD-10-CM | POA: Diagnosis not present

## 2019-11-24 MED ORDER — OXYCODONE HCL 5 MG PO TABS: 10.0000 mg | ORAL_TABLET | Freq: Two times a day (BID) | ORAL | 0 refills | Status: DC

## 2019-11-24 NOTE — Therapy (Addendum)
[place], [place], [address] Phone: [phone]   Fax:  [phone]  Physical Therapy Treatment/Discharge   Patient Details  Name: Jane English MRN: [id number] Date of Birth: Mar 17, 1971 Referring Provider (PT): Leeroy Cha    Encounter Date: 11/23/2019  PT End of Session - 11/24/19 1636    Visit Number  2    Number of Visits  6    Date for PT Re-Evaluation  12/27/19    Authorization Type  BCBS    PT Start Time  0998    PT Stop Time  1638    PT Time Calculation (min)  53 min    Activity Tolerance  Patient tolerated treatment well    Behavior During Therapy  [facility] for tasks assessed/performed       Past Medical History:  Diagnosis Date  . Anemia   . Asthma   . Asthma flare 04/09/2013  . Back pain   . Bronchitis   . Chronic abdominal pain   . Chronic constipation   . Constipation due to opioid therapy   . Depression   . Diabetes mellitus without complication ([place])   . Diabetes mellitus, type II ([place])   . DVT (deep venous thrombosis) ([place]) 2010  . GERD (gastroesophageal reflux disease)   . Heart murmur    no cardiologist  . Helicobacter pylori gastritis 06/11/2013   Colonoscopy Dr. Hilarie Fredrickson  . Hypertension   . IBS (irritable bowel syndrome)   . Migraine headache   . Neuropathy   . Obesity   . Obsessive-compulsive disorder   . PSYCHOTIC D/O W/HALLUCINATIONS CONDS CLASS ELSW 03/04/2010   Qualifier: Diagnosis of  By: Moshe Cipro MD, Joycelyn Schmid    . PTSD (post-traumatic stress disorder)   . SBO (small bowel obstruction) ([place]) 08/09/2013  . Seasonal allergies 12/10/2012  . Seizures ([place])   . Shortness of breath     Past Surgical History:  Procedure Laterality Date  . ANTERIOR CERVICAL DECOMP/DISCECTOMY FUSION  07/07/2012   Procedure: ANTERIOR CERVICAL DECOMPRESSION/DISCECTOMY FUSION 2 LEVELS;  Surgeon: Floyce Stakes, MD;  Location: [facility];  Service: Neurosurgery;  Laterality: N/A;  Cervical  four-five, five - six  Anterior cervical decompression/diskectomy/fusion/plate  . APPENDECTOMY  1986  . BOWEL RESECTION N/A 07/29/2013   Procedure: serosal repair;  Surgeon: Adin Hector, MD;  Location: [facility];  Service: General;  Laterality: N/A;  . CARPAL TUNNEL RELEASE Bilateral   . LAPAROSCOPY N/A 07/29/2013   Procedure: diagnostic laporoscopy;  Surgeon: Adin Hector, MD;  Location: [facility];  Service: General;  Laterality: N/A;  . LAPAROSCOPY N/A 08/16/2013   Procedure: LAPAROSCOPY DIAGNOSTIC/LYSIS OF ADHESIONS;  Surgeon: Adin Hector, MD;  Location: [facility];  Service: General;  Laterality: N/A;  . LAPAROTOMY N/A 08/16/2013   Procedure: EXPLORATORY LAPAROTOMY/SMALL BOWEL RESECTION (JEJUNUM);  Surgeon: Adin Hector, MD;  [facility]: [facility];  Service: General;  Laterality: N/A;  . LUMBAR SPINE SURGERY  2010   x 3  . LYSIS OF ADHESION  2003   Dr. Irving Shows  . LYSIS OF ADHESION N/A 07/29/2013   Procedure: LYSIS OF ADHESION;  Surgeon: Adin Hector, MD;  [facility]: [facility];  Service: General;  Laterality: N/A;  . OOPHORECTOMY    . PARTIAL HYSTERECTOMY  1990s?   [place], [place]  . SPINAL CORD STIMULATOR IMPLANT    . TRIGGER FINGER RELEASE  2009   right pinkie finger  . TUBAL LIGATION  1994    There were  no vitals filed for this visit.  Subjective Assessment - 11/24/19 1633    Subjective  Patient reports that after the last visit she had pain for several days. Therapy only perfromed light manual therapy to her neck and perfromed an eval. Shewas advised that this is not a normal respose to therapy. She has tried her stretches and has had mixed resutls.    Limitations  Other (comment)    How long can you sit comfortably?  no limit    How long can you stand comfortably?  no limit    How long can you walk comfortably?  no limit    Currently in Pain?  Yes    Pain Score  8     Pain Location  Neck    Pain Orientation  Right;Left    Pain Descriptors / Indicators  Aching    Pain Type   Chronic pain    Pain Onset  More than a month ago    Pain Frequency  Intermittent    Aggravating Factors   turning her head    Pain Relieving Factors  heat    Effect of Pain on Daily Activities  pain all the time                       [facility] Adult PT Treatment/Exercise - 11/24/19 0001      Neck Exercises: Standing   Other Standing Exercises  scap retraction yyellow 2x10; shoulder extnesion 2x10       Shoulder Exercises: Pulleys   Other Pulley Exercises  rythmic pulleys with cuing for pain free range 2 min       Modalities   Modalities  Moist Heat;Electrical Stimulation      Moist Heat Therapy   Number Minutes Moist Heat  15 Minutes    Moist Heat Location  Cervical      Electrical Stimulation   Electrical Stimulation Location  cervical spine     Electrical Stimulation Action  IFC     Electrical Stimulation Parameters  to tolerance     Electrical Stimulation Goals  Pain      Manual Therapy   Soft tissue mobilization  to upper traps and cervical spine     Manual Traction  sub-occipital release and gentle manula traction in supine      Neck Exercises: Stretches   Upper Trapezius Stretch Limitations  2x20 sec hold bilateral with max cuing     Levator Stretch Limitations  2x20 sec hold with max cuing              PT Education - 11/24/19 1636    Education Details  relaation techniques,    Person(s) Educated  Patient    Methods  Explanation;Demonstration;Tactile cues;Verbal cues    Comprehension  Returned demonstration;Verbalized understanding;Verbal cues required;Tactile cues required       PT Short Term Goals - 11/24/19 1723      PT SHORT TERM GOAL #1   Title  Patient will increase cervical flexion and extension by 5 degrees    Time  3    Period  Weeks    Status  On-going    Target Date  12/06/19      PT SHORT TERM GOAL #2   Title  Patient will increase bilateral grip strength by 5 lbs    Time  3    Period  Weeks    Status  On-going       PT SHORT TERM GOAL #3  Title  Patient will increase bilateral cervical rotation by 15 degrees    Time  3    Period  Weeks    Target Date  12/06/19        PT Long Term Goals - 11/15/19 1613      PT LONG TERM GOAL #1   Title  Patient will reach overhead without pulling and increased pain    Time  6    Period  Weeks    Status  New    Target Date  12/27/19      PT LONG TERM GOAL #2   Title  Patient will be independent with complete HEP    Time  6    Period  Weeks    Status  New    Target Date  12/27/19      PT LONG TERM GOAL #3   Title  Patient will increase to 60 cervical rotation to improve ability to drive without pain    Time  6    Period  Weeks    Status  New    Target Date  12/27/19            Plan - 11/24/19 1642    Clinical Impression Statement  Therapy focused on manual therapy today. She perforemed light exercises without pain. She keeps her shoulder hiked up. She can not relax her shoulders.Therapy perfromed IASYM to her upper traps. She has limited tolerance to light touch which made manual therapy difficult. She was adived if she has several days of significantly increased pain after therapy this time to cancel her furture appointments and we will put her on hold.    Personal Factors and Comorbidities  Comorbidity 1;Comorbidity 3+;Comorbidity 2    Comorbidities  OCD, ACDF of C2-C#; Multi leval disc degeneration, PTSD    Examination-Activity Limitations  Carry;Lift;Reach Overhead    Examination-Participation Restrictions  Shop;Cleaning;Meal Prep;Medication Management    Stability/Clinical Decision Making  Evolving/Moderate complexity    Clinical Decision Making  Moderate    Rehab Potential  Good    PT Frequency  1x / week    PT Duration  6 weeks    PT Treatment/Interventions  ADLs/Self Care Home Management;Electrical Stimulation;Cryotherapy;Iontophoresis 27m/ml Dexamethasone;Moist Heat;Traction;Ultrasound;Therapeutic exercise;Therapeutic  activities;Neuromuscular re-education;Patient/family education;Manual techniques    PT Next Visit Plan  soft tissue mobilization; consder dry needling if she is open to it. IATYM to traps if able to tolerate; begin postural correction;    PT Home Exercise Plan  upper trap stretch; levator stretch, scap retraction; cervical rotation x3 bilateral    Consulted and Agree with Plan of Care  Patient       Patient will benefit from skilled therapeutic intervention in order to improve the following deficits and impairments:  Pain, Decreased range of motion, Impaired UE functional use, Increased muscle spasms, Decreased activity tolerance, Decreased strength, Postural dysfunction  Visit Diagnosis: Cervicalgia  Other muscle spasm  Abnormal posture  PHYSICAL THERAPY DISCHARGE SUMMARY  Visits from Start of Care: 2  Current functional level related to goals / functional outcomes: Continued pain/ no improvement    Remaining deficits: Continued pain in neck and back   Education / Equipment: HEP   Plan: Patient agrees to discharge.  Patient goals were not met. Patient is being discharged due to lack of progress.  ?????    2   Problem List Patient Active Problem List   Diagnosis Date Noted  . Urinary urgency 08/11/2019  . Overweight (BMI 25.0-29.9) 04/26/2019  . GAD (generalized  anxiety disorder) 04/26/2019  . Depression, major, single episode, severe ([place]) 10/03/2018  . Vitamin D deficiency 12/14/2017  . Bilateral ankle pain 04/16/2017  . Amenorrhea 10/15/2016  . Paresthesia 05/06/2016  . Back pain with radiation 04/04/2016  . Neuropathic pain of finger of right hand 12/31/2015  . Pap smear of cervix shows high risk HPV present 08/08/2015  . Cyst of left ovary 02/06/2015  . Anovulation 02/06/2015  . Secondary amenorrhea 02/06/2015  . Intractable chronic migraine without aura and without status migrainosus 12/21/2014  . Migraine 09/21/2014  . Anemia 10/11/2013  . H. pylori  duodenitis 07/07/2013  . PTSD (post-traumatic stress disorder) 03/26/2013  . Insomnia 03/18/2013  . Seasonal allergies 12/10/2012  . GERD (gastroesophageal reflux disease) 10/29/2012  . Chronic pain syndrome 10/06/2012  . Postlaminectomy syndrome 10/06/2012  . Cervical neck pain with evidence of disc disease 04/13/2012  . Allergic rhinitis 04/17/2010  . Cough variant asthma 03/04/2010  . Prediabetes 01/05/2009  . Essential hypertension 11/27/2007    Carney Living PT DPT  11/24/2019, 5:25 PM  [facility] [address] [place], [place], [address] Phone: [phone]   Fax:  [phone]  Name: Jane English MRN: [id number] Date of Birth: [birth year]

## 2019-11-24 NOTE — Progress Notes (Signed)
Subjective:    Patient ID: Jane English, female    DOB: 24-Nov-1970, 49 y.o.   MRN: [id number]  HPI  49 year old woman with chronic pain after failed back surgery. She would like to be able to spend more time with her grandchildren, drive. Her pain was better controlled in the past but when she switched doctors her pain increased. Her pain is worst in the neck and she has tried Voltaren gel. She does have shooting pain into her lower legs.  She has an allergy to Gabapentin. She was prescribed steroids to help reduce inflammatory pain but she does not like taking them because they make her gain weight.   Her pain and function have been improved with last visit's prescription of Roxicodone. She has been taking 2 tablets twice per day and has been able to engage in her daily ADLs. She is currently undergoing a divorce and this is a major source of stress for her, though she has a great support system of her 4 children and 2 grandchildren, cousin, and best friend. Her sources of joy are spending time with her family, especially her grandchildren, watching TV. She has been going up and down her 17 stairs for exercise 3 times per week.   Pain Inventory Average Pain 8 Pain Right Now 7 My pain is sharp, burning, stabbing, tingling and aching  In the last 24 hours, has pain interfered with the following? General activity 5 Relation with others 5 Enjoyment of life 5 What TIME of day is your pain at its worst? daytime Sleep (in general) Fair  Pain is worse with: walking and standing Pain improves with: heat/ice and medication Relief from Meds: 8  Mobility ability to climb steps?  yes do you drive?  yes  Function disabled: date disabled .  Neuro/Psych No problems in this area  Prior Studies Any changes since last visit?  no  Physicians involved in your care Any changes since last visit?  no q   Family History  Problem Relation Age of Onset  . Lung cancer Father   . Stomach  cancer Father   . Esophageal cancer Father   . Alcohol abuse Father   . Mental illness Father   . Diabetes Sister   . Hypertension Sister   . Bipolar disorder Sister   . Schizophrenia Sister   . Diabetes Sister   . Alcohol abuse Brother   . Hypertension Brother   . Kidney disease Brother   . Diabetes Brother   . Drug abuse Brother   . Mental illness Brother   . Alcohol abuse Brother   . Alcohol abuse Brother   . Hypertension Brother   . Diabetes Brother   . Alcohol abuse Brother   . Physical abuse Mother   . Alcohol abuse Mother   . Cirrhosis Mother   . Mental illness Brother        in [place]  . Drug abuse Sister   . HIV Sister   . Alcohol abuse Brother   . Pneumonia Sister        died as a baby  . Alcohol abuse Brother   . Bipolar disorder Brother   . Bipolar disorder Daughter   . Bipolar disorder Son   . Bipolar disorder Son   . Hypertension Brother   . Bipolar disorder Brother   . Drug abuse Brother   . Alcohol abuse Brother   . Bipolar disorder Brother   . ADD / ADHD Neg Hx   .  Anxiety disorder Neg Hx   . Dementia Neg Hx   . Depression Neg Hx   . OCD Neg Hx   . Seizures Neg Hx   . Paranoid behavior Neg Hx   . Colon cancer Neg Hx    Social History   Socioeconomic History  . Marital status: Married    Spouse name: Not on file  . Number of children: 4  . Years of education: Not on file  . Highest education level: Not on file  Occupational History  . Not on file  Tobacco Use  . Smoking status: Never Smoker  . Smokeless tobacco: Never Used  Substance and Sexual Activity  . Alcohol use: No  . Drug use: No  . Sexual activity: Yes    Partners: Male    Birth control/protection: Surgical    Comment: tubal  Other Topics Concern  . Not on file  Social History Narrative  . Not on file   Social Determinants of Health   Financial Resource Strain:   . Difficulty of Paying Living Expenses: Not on file  Food Insecurity:   . Worried About Sales executive in the Last Year: Not on file  . Ran Out of Food in the Last Year: Not on file  Transportation Needs:   . Lack of Transportation (Medical): Not on file  . Lack of Transportation (Non-Medical): Not on file  Physical Activity:   . Days of Exercise per Week: Not on file  . Minutes of Exercise per Session: Not on file  Stress:   . Feeling of Stress : Not on file  Social Connections:   . Frequency of Communication with Friends and Family: Not on file  . Frequency of Social Gatherings with Friends and Family: Not on file  . Attends Religious Services: Not on file  . Active Member of Clubs or Organizations: Not on file  . Attends Archivist Meetings: Not on file  . Marital Status: Not on file   Past Surgical History:  Procedure Laterality Date  . ANTERIOR CERVICAL DECOMP/DISCECTOMY FUSION  07/07/2012   Procedure: ANTERIOR CERVICAL DECOMPRESSION/DISCECTOMY FUSION 2 LEVELS;  Surgeon: Floyce Stakes, MD;  Location: [facility];  Service: Neurosurgery;  Laterality: N/A;  Cervical four-five, five - six  Anterior cervical decompression/diskectomy/fusion/plate  . APPENDECTOMY  1986  . BOWEL RESECTION N/A 07/29/2013   Procedure: serosal repair;  Surgeon: Adin Hector, MD;  Location: [facility];  Service: General;  Laterality: N/A;  . CARPAL TUNNEL RELEASE Bilateral   . LAPAROSCOPY N/A 07/29/2013   Procedure: diagnostic laporoscopy;  Surgeon: Adin Hector, MD;  Location: [facility];  Service: General;  Laterality: N/A;  . LAPAROSCOPY N/A 08/16/2013   Procedure: LAPAROSCOPY DIAGNOSTIC/LYSIS OF ADHESIONS;  Surgeon: Adin Hector, MD;  Location: [facility];  Service: General;  Laterality: N/A;  . LAPAROTOMY N/A 08/16/2013   Procedure: EXPLORATORY LAPAROTOMY/SMALL BOWEL RESECTION (JEJUNUM);  Surgeon: Adin Hector, MD;  Location: [facility];  Service: General;  Laterality: N/A;  . LUMBAR SPINE SURGERY  2010   x 3  . LYSIS OF ADHESION  2003   Dr. Irving Shows  . LYSIS OF ADHESION N/A 07/29/2013     Procedure: LYSIS OF ADHESION;  Surgeon: Adin Hector, MD;  [facility]: [facility];  Service: General;  Laterality: N/A;  . OOPHORECTOMY    . PARTIAL HYSTERECTOMY  1990s?   [place], [place]  . SPINAL CORD STIMULATOR IMPLANT    . TRIGGER FINGER RELEASE  2009  right pinkie finger  . TUBAL LIGATION  1994   Past Medical History:  Diagnosis Date  . Anemia   . Asthma   . Asthma flare 04/09/2013  . Back pain   . Bronchitis   . Chronic abdominal pain   . Chronic constipation   . Constipation due to opioid therapy   . Depression   . Diabetes mellitus without complication ([place])   . Diabetes mellitus, type II ([place])   . DVT (deep venous thrombosis) ([place]) 2010  . GERD (gastroesophageal reflux disease)   . Heart murmur    no cardiologist  . Helicobacter pylori gastritis 06/11/2013   Colonoscopy Dr. Hilarie Fredrickson  . Hypertension   . IBS (irritable bowel syndrome)   . Migraine headache   . Neuropathy   . Obesity   . Obsessive-compulsive disorder   . PSYCHOTIC D/O W/HALLUCINATIONS CONDS CLASS ELSW 03/04/2010   Qualifier: Diagnosis of  By: Moshe Cipro MD, Joycelyn Schmid    . PTSD (post-traumatic stress disorder)   . SBO (small bowel obstruction) ([place]) 08/09/2013  . Seasonal allergies 12/10/2012  . Seizures ([place])   . Shortness of breath    There were no vitals taken for this visit.  Opioid Risk Score:   Fall Risk Score:  `1  Depression screen PHQ 2/9  Depression screen [facility] 2/9 10/27/2019 09/27/2019 09/23/2019 08/10/2019 04/21/2019 01/14/2019 08/27/2018  Decreased Interest 0 0 2 0 0 0 3  Down, Depressed, Hopeless 0 0 2 0 0 0 3  PHQ - 2 Score 0 0 4 0 0 0 6  Altered sleeping - 1 2 - - - 3  Tired, decreased energy - 1 2 - - - 3  Change in appetite - 0 2 - - - 2  Feeling bad or failure about yourself  - 0 1 - - - 1  Trouble concentrating - 0 2 - - - 0  Moving slowly or fidgety/restless - 0 0 - - - 0  Suicidal thoughts - 0 0 - - - 0  PHQ-9 Score - 2 13 - - - 15  Difficult doing work/chores - Not difficult at  all Somewhat difficult - - - Very difficult  Some encounter information is confidential and restricted. Go to Review Flowsheets activity to see all data.  Some recent data might be hidden     Review of Systems  Constitutional: Positive for unexpected weight change.  HENT: Negative.   Eyes: Negative.   Respiratory: Negative.   Cardiovascular: Negative.   Gastrointestinal: Negative.   Endocrine: Negative.   Genitourinary: Negative.   Musculoskeletal: Positive for arthralgias and back pain.  Skin: Negative.   Allergic/Immunologic: Negative.   Neurological: Negative.   Hematological: Negative.   Psychiatric/Behavioral: Negative.   All other systems reviewed and are negative.      Objective:   Physical Exam  Gen: no distress, normal appearing HEENT: oral mucosa pink and moist, NCAT Cardio: Reg rate Chest: normal effort, normal rate of breathing Abd: soft, non-distended Ext: no edema Skin: intact Neuro: AOx3 Follows commands.  MSK: Normal gait and tandem walk. Full ROM of lumbar spine on flexion, limited in extension. Both flexion and extension result in pain, but less than last visit. +Slump test on the left, which produces pain down the leg to the calf in the S1 distribution.  +bilateral hand tremor Psych: pleasant, normal affect    Assessment & Plan:  49 year old woman with chronic pain after failed back surgery.  --She has severe pain, worse in her lower  back and cervical spine.  -Reviewed MRI lumbar spine from 2013 with shows the following: 1. Stable appearance of the lumbar spine status post L5-S1 PLIF. 2. No significant adjacent segment disease, disc herniation, acquired spinal stenosis or nerve root encroachment. Mild facet degenerative changes inferiorly are stable. 3. No abnormal intradural enhancement.   --She has been on Oxycodone in the past and had functional benefits. She was able to walk more , about 2 miles per day. She as tried surgery,  injections, and neuropathic pain medications without relief or with negative side effects. After UDS and contract, I prescribed Roxicodone 10mg  BID. Discussed that our goals are functional improvement and not long term dependence on oxycodone. Discussed creating a daily exercise regimen for pain control and overall health.   --Provided with referral to [facility]: Referral to Dr. Clydell Hakim for patient s/p L5-S1 PLIF now with an inactive pain stimulation system, implanted 10/30/12 Serial: [id number]. She would benefit from reactivation of this stimulation system. She has not yet heard from practice and will stop by to schedule an appointment after this appointment.   --Life stressors. Discussed at length her stress regarding her divorce, as well as her sources of support and joy. Encouraged social interaction and physical activity. She will be caring for her granddaughter this weekend, continuing her cervical myofascial pain physical therapy, and commits to going up and down her 17 stairs every day of the week.   20 minutes of face to face patient care time were spent during this visit. All questions were encouraged and answered. Follow up with me in 4 weeks.

## 2019-11-26 ENCOUNTER — Telehealth: Payer: Self-pay

## 2019-11-26 NOTE — Telephone Encounter (Signed)
Urine drug screen for this encounter is IN consistent for prescribed medication PCP is rx Alprazolam patient has Oxazepam in urine no record on PMP dating to [id number]

## 2019-11-26 NOTE — Telephone Encounter (Signed)
Hi Lisa, She has been prescribed Temazepam and the Oxazepam is a metabolite of this medication. I will ask the patient whether she has been taking the Temazepam.  Thank you! Best, Martha Clan

## 2019-11-30 ENCOUNTER — Encounter (HOSPITAL_BASED_OUTPATIENT_CLINIC_OR_DEPARTMENT_OTHER): Payer: BC Managed Care – PPO | Admitting: Physical Medicine and Rehabilitation

## 2019-11-30 ENCOUNTER — Ambulatory Visit: Payer: BC Managed Care – PPO | Admitting: Physical Therapy

## 2019-11-30 ENCOUNTER — Encounter: Payer: Self-pay | Admitting: Physical Medicine and Rehabilitation

## 2019-11-30 ENCOUNTER — Other Ambulatory Visit: Payer: Self-pay

## 2019-11-30 VITALS — BP 125/80 | HR 102 | Temp 97.3°F | Ht 66.0 in | Wt 189.8 lb

## 2019-11-30 DIAGNOSIS — M7918 Myalgia, other site: Secondary | ICD-10-CM

## 2019-11-30 DIAGNOSIS — M961 Postlaminectomy syndrome, not elsewhere classified: Secondary | ICD-10-CM

## 2019-11-30 DIAGNOSIS — Z79891 Long term (current) use of opiate analgesic: Secondary | ICD-10-CM

## 2019-11-30 DIAGNOSIS — M5136 Other intervertebral disc degeneration, lumbar region: Secondary | ICD-10-CM | POA: Diagnosis not present

## 2019-11-30 DIAGNOSIS — Z5181 Encounter for therapeutic drug level monitoring: Secondary | ICD-10-CM | POA: Diagnosis not present

## 2019-11-30 DIAGNOSIS — G894 Chronic pain syndrome: Secondary | ICD-10-CM

## 2019-11-30 NOTE — Progress Notes (Signed)
Subjective:    Patient ID: Jane English, female    DOB: 1971/09/29, 49 y.o.   MRN: [id number]  HPI 49 year old woman with chronic pain after failed back surgery. She would like to be able to spend more time with her grandchildren, drive. Her pain was better controlled in the past but when she switched doctors her pain increased. Her pain is worst in the neck and she has tried Voltaren gel. She does have shooting pain into her lower legs.  She has an allergy to Gabapentin. She was prescribed steroids to help reduce inflammatory pain but she does not like taking them because they make her gain weight.  Her pain and function have been improved with last visit's prescription of Roxicodone. She has been taking 2 tablets twice per day and has been able to engage in her daily ADLs. She is currently undergoing a divorce and this is a major source of stress for her, though she has a great support system of her 4 children and 2 grandchildren, cousin, and best friend. Her sources of joy are spending time with her family, especially her grandchildren, watching TV. She has been going up and down her 17 stairs for exercise every day except when it rains outside.  She had spinal cord stimulator placed in 2007 and it has been nonfunctional for some time, she was told possibly due to scar tissue. She would like reevaluation to see if it can be fixed.  Her last urine tox has oxazepam but she denies taking Temazepam or Valium. We reviewed all her medications. She denies getting medications from others. She is agreeable to repeating urine test today.  Pain Inventory Average Pain 10 Pain Right Now 6 My pain is sharp, burning, dull, stabbing, tingling and aching  In the last 24 hours, has pain interfered with the following? General activity 6 Relation with others 6 Enjoyment of life 6 What TIME of day is your pain at its worst? varies Sleep (in general) Fair  Pain is worse with: walking and standing Pain  improves with: medication Relief from Meds: 8  Mobility how many minutes can you walk? 20 ability to climb steps?  yes do you drive?  yes  Function not employed: date last employed laid off  Neuro/Psych spasms anxiety  Prior Studies Any changes since last visit?  no  Physicians involved in your care Any changes since last visit?  no   Family History  Problem Relation Age of Onset  . Lung cancer Father   . Stomach cancer Father   . Esophageal cancer Father   . Alcohol abuse Father   . Mental illness Father   . Diabetes Sister   . Hypertension Sister   . Bipolar disorder Sister   . Schizophrenia Sister   . Diabetes Sister   . Alcohol abuse Brother   . Hypertension Brother   . Kidney disease Brother   . Diabetes Brother   . Drug abuse Brother   . Mental illness Brother   . Alcohol abuse Brother   . Alcohol abuse Brother   . Hypertension Brother   . Diabetes Brother   . Alcohol abuse Brother   . Physical abuse Mother   . Alcohol abuse Mother   . Cirrhosis Mother   . Mental illness Brother        in [place]  . Drug abuse Sister   . HIV Sister   . Alcohol abuse Brother   . Pneumonia Sister  died as a baby  . Alcohol abuse Brother   . Bipolar disorder Brother   . Bipolar disorder Daughter   . Bipolar disorder Son   . Bipolar disorder Son   . Hypertension Brother   . Bipolar disorder Brother   . Drug abuse Brother   . Alcohol abuse Brother   . Bipolar disorder Brother   . ADD / ADHD Neg Hx   . Anxiety disorder Neg Hx   . Dementia Neg Hx   . Depression Neg Hx   . OCD Neg Hx   . Seizures Neg Hx   . Paranoid behavior Neg Hx   . Colon cancer Neg Hx    Social History   Socioeconomic History  . Marital status: Married    Spouse name: Not on file  . Number of children: 4  . Years of education: Not on file  . Highest education level: Not on file  Occupational History  . Not on file  Tobacco Use  . Smoking status: Never Smoker  . Smokeless  tobacco: Never Used  Substance and Sexual Activity  . Alcohol use: No  . Drug use: No  . Sexual activity: Yes    Partners: Male    Birth control/protection: Surgical    Comment: tubal  Other Topics Concern  . Not on file  Social History Narrative  . Not on file   Social Determinants of Health   Financial Resource Strain:   . Difficulty of Paying Living Expenses: Not on file  Food Insecurity:   . Worried About Charity fundraiser in the Last Year: Not on file  . Ran Out of Food in the Last Year: Not on file  Transportation Needs:   . Lack of Transportation (Medical): Not on file  . Lack of Transportation (Non-Medical): Not on file  Physical Activity:   . Days of Exercise per Week: Not on file  . Minutes of Exercise per Session: Not on file  Stress:   . Feeling of Stress : Not on file  Social Connections:   . Frequency of Communication with Friends and Family: Not on file  . Frequency of Social Gatherings with Friends and Family: Not on file  . Attends Religious Services: Not on file  . Active Member of Clubs or Organizations: Not on file  . Attends Archivist Meetings: Not on file  . Marital Status: Not on file   Past Surgical History:  Procedure Laterality Date  . ANTERIOR CERVICAL DECOMP/DISCECTOMY FUSION  07/07/2012   Procedure: ANTERIOR CERVICAL DECOMPRESSION/DISCECTOMY FUSION 2 LEVELS;  Surgeon: Floyce Stakes, MD;  Location: [facility];  Service: Neurosurgery;  Laterality: N/A;  Cervical four-five, five - six  Anterior cervical decompression/diskectomy/fusion/plate  . APPENDECTOMY  1986  . BOWEL RESECTION N/A 07/29/2013   Procedure: serosal repair;  Surgeon: Adin Hector, MD;  Location: [facility];  Service: General;  Laterality: N/A;  . CARPAL TUNNEL RELEASE Bilateral   . LAPAROSCOPY N/A 07/29/2013   Procedure: diagnostic laporoscopy;  Surgeon: Adin Hector, MD;  Location: [facility];  Service: General;  Laterality: N/A;  . LAPAROSCOPY N/A 08/16/2013    Procedure: LAPAROSCOPY DIAGNOSTIC/LYSIS OF ADHESIONS;  Surgeon: Adin Hector, MD;  Location: [facility];  Service: General;  Laterality: N/A;  . LAPAROTOMY N/A 08/16/2013   Procedure: EXPLORATORY LAPAROTOMY/SMALL BOWEL RESECTION (JEJUNUM);  Surgeon: Adin Hector, MD;  Location: [facility];  Service: General;  Laterality: N/A;  . LUMBAR SPINE SURGERY  2010   x  3  . LYSIS OF ADHESION  2003   Dr. Irving Shows  . LYSIS OF ADHESION N/A 07/29/2013   Procedure: LYSIS OF ADHESION;  Surgeon: Adin Hector, MD;  [facility];  Service: General;  Laterality: N/A;  . OOPHORECTOMY    . PARTIAL HYSTERECTOMY  1990s?   [place], [place]  . SPINAL CORD STIMULATOR IMPLANT    . TRIGGER FINGER RELEASE  2009   right pinkie finger  . TUBAL LIGATION  1994   Past Medical History:  Diagnosis Date  . Anemia   . Asthma   . Asthma flare 04/09/2013  . Back pain   . Bronchitis   . Chronic abdominal pain   . Chronic constipation   . Constipation due to opioid therapy   . Depression   . Diabetes mellitus without complication ([place])   . Diabetes mellitus, type II ([place])   . DVT (deep venous thrombosis) ([place]) 2010  . GERD (gastroesophageal reflux disease)   . Heart murmur    no cardiologist  . Helicobacter pylori gastritis 06/11/2013   Colonoscopy Dr. Hilarie Fredrickson  . Hypertension   . IBS (irritable bowel syndrome)   . Migraine headache   . Neuropathy   . Obesity   . Obsessive-compulsive disorder   . PSYCHOTIC D/O W/HALLUCINATIONS CONDS CLASS ELSW 03/04/2010   Qualifier: Diagnosis of  By: Moshe Cipro MD, Joycelyn Schmid    . PTSD (post-traumatic stress disorder)   . SBO (small bowel obstruction) ([place]) 08/09/2013  . Seasonal allergies 12/10/2012  . Seizures ([place])   . Shortness of breath    BP 125/80   Pulse (!) 102   Temp (!) 97.3 F (36.3 C)   Ht 5\' 6"  (1.676 m)   Wt 189 lb 12.8 oz (86.1 kg)   SpO2 93%   BMI 30.63 kg/m   Opioid Risk Score:   Fall Risk Score:  `1  Depression screen PHQ 2/9  Depression screen [facility] 2/9  10/27/2019 09/27/2019 09/23/2019 08/10/2019 04/21/2019 01/14/2019 08/27/2018  Decreased Interest 0 0 2 0 0 0 3  Down, Depressed, Hopeless 0 0 2 0 0 0 3  PHQ - 2 Score 0 0 4 0 0 0 6  Altered sleeping - 1 2 - - - 3  Tired, decreased energy - 1 2 - - - 3  Change in appetite - 0 2 - - - 2  Feeling bad or failure about yourself  - 0 1 - - - 1  Trouble concentrating - 0 2 - - - 0  Moving slowly or fidgety/restless - 0 0 - - - 0  Suicidal thoughts - 0 0 - - - 0  PHQ-9 Score - 2 13 - - - 15  Difficult doing work/chores - Not difficult at all Somewhat difficult - - - Very difficult  Some encounter information is confidential and restricted. Go to Review Flowsheets activity to see all data.  Some recent data might be hidden    Review of Systems Constitutional: Positive for unexpected weight change.  HENT: Negative.   Eyes: Negative.   Respiratory: Negative.   Cardiovascular: Negative.   Gastrointestinal: Negative.   Endocrine: Negative.   Genitourinary: Negative.   Musculoskeletal: Positive for arthralgias and back pain.  Skin: Negative.   Allergic/Immunologic: Negative.   Neurological: Negative.   Hematological: Negative.   Psychiatric/Behavioral: Negative.   All other systems reviewed and are negative.    Objective:   Physical Exam Gen: no distress, normal appearing HEENT: oral mucosa pink and moist, NCAT Cardio: Reg  rate Chest: normal effort, normal rate of breathing Abd: soft, non-distended Ext: no edema Skin: intact Neuro: AOx3 Follows commands.  MSK: Normal gait and tandem walk. Full ROM of lumbar spine on flexion, limited in extension. Both flexion and extension result in pain, but less than last visit.+Slump test on the left, which produces pain down the leg to the calf in the S1 distribution. +bilateral hand tremor Psych: pleasant, normal affect        Assessment & Plan:  49 year old woman with chronic pain after failed back surgery.  --She has severe pain,  worse in her lower back and cervical spine.  -Reviewed MRI lumbar spine from 2013 with shows the following: 1. Stable appearance of the lumbar spine status post L5-S1 PLIF. 2. No significant adjacent segment disease, disc herniation, acquired spinal stenosis or nerve root encroachment. Mild facet degenerative changes inferiorly are stable. 3. No abnormal intradural enhancement.   --She has been on Oxycodone in the past and had functional benefits. She was able to walk more , about 2 miles per day. She as tried surgery, injections, and neuropathic pain medications without relief or with negative side effects. After UDS and contract, I prescribed Roxicodone 10mg  BID. Discussed that our goals are functional improvement and not long term dependence on oxycodone. Discussed creating a daily exercise regimen for pain control and overall health.She has been doing her 17 stairs daily. Her last urine sample has Oxazepam in it but she denies taking temazepam or valium. We reviewed all her medications and she is not getting medications from others. She is willing to repeat her urine sample today.   --[facility] Referral to Dr. Clydell Hakim for patient s/p L5-S1 PLIF now with an inactive pain stimulation system, implanted 10/30/12 Serial: [id number]. She would benefit from reactivation of this stimulation system. She has not yet heard from practice. I will provide new referral and will call practice to see if I can get an appointment for her.   --Life stressors. Discussed at length her stress regarding her divorce, as well as her sources of support and joy. Encouraged social interaction and physical activity.   20 minutes of face to face patient care time were spent during this visit. All questions were encouraged and answered. Follow up with me at scheduled appointment in February.

## 2019-12-03 ENCOUNTER — Other Ambulatory Visit: Payer: Self-pay | Admitting: Family Medicine

## 2019-12-03 ENCOUNTER — Encounter: Payer: Self-pay | Admitting: Family Medicine

## 2019-12-03 ENCOUNTER — Ambulatory Visit
Admission: EM | Admit: 2019-12-03 | Discharge: 2019-12-03 | Disposition: A | Discharge status: Home / Self Care | Payer: BC Managed Care – PPO | Attending: Emergency Medicine | Admitting: Emergency Medicine

## 2019-12-03 ENCOUNTER — Other Ambulatory Visit: Payer: Self-pay

## 2019-12-03 DIAGNOSIS — R220 Localized swelling, mass and lump, head: Secondary | ICD-10-CM | POA: Diagnosis not present

## 2019-12-03 MED ORDER — ZYRTEC ALLERGY 10 MG PO CAPS: 10.0000 mg | ORAL_CAPSULE | ORAL | 0 refills | Status: DC

## 2019-12-03 MED ORDER — PREDNISONE 10 MG PO TABS: 20.0000 mg | ORAL_TABLET | Freq: Every day | ORAL | 0 refills | Status: DC

## 2019-12-03 NOTE — ED Triage Notes (Signed)
Pt presents with c/o swollen face , left side of face is swollen, pt denies pain. Pt states she had physical therapy on neck 2 days ago and manipulation was painful then

## 2019-12-03 NOTE — ED Provider Notes (Signed)
[facility]    CSN: [id number] Arrival date & time: 12/03/19  0948      History   Chief Complaint Chief Complaint  Patient presents with  . Facial Swelling    HPI Jane English is a 49 y.o. female.   Jane English 48 years old female presented to the urgent care for complaint of left side face  swollen for the past 1 day.  Denies any precipitating event.  Currently she is on therapy for back and neck pain.  Reports she has not used any medication.  Denies nausea, vomiting, chest pain, chest tightness, numbness, tingling, dental caries, dental abscess.     Past Medical History:  Diagnosis Date  . Anemia   . Asthma   . Asthma flare 04/09/2013  . Back pain   . Bronchitis   . Chronic abdominal pain   . Chronic constipation   . Constipation due to opioid therapy   . Depression   . Diabetes mellitus without complication ([place])   . Diabetes mellitus, type II ([place])   . DVT (deep venous thrombosis) ([place]) 2010  . GERD (gastroesophageal reflux disease)   . Heart murmur    no cardiologist  . Helicobacter pylori gastritis 06/11/2013   Colonoscopy Dr. Hilarie Fredrickson  . Hypertension   . IBS (irritable bowel syndrome)   . Migraine headache   . Neuropathy   . Obesity   . Obsessive-compulsive disorder   . PSYCHOTIC D/O W/HALLUCINATIONS CONDS CLASS ELSW 03/04/2010   Qualifier: Diagnosis of  By: Moshe Cipro MD, Joycelyn Schmid    . PTSD (post-traumatic stress disorder)   . SBO (small bowel obstruction) ([place]) 08/09/2013  . Seasonal allergies 12/10/2012  . Seizures ([place])   . Shortness of breath     Patient Active Problem List   Diagnosis Date Noted  . Urinary urgency 08/11/2019  . Overweight (BMI 25.0-29.9) 04/26/2019  . GAD (generalized anxiety disorder) 04/26/2019  . Depression, major, single episode, severe ([place]) 10/03/2018  . Vitamin D deficiency 12/14/2017  . Bilateral ankle pain 04/16/2017  . Amenorrhea 10/15/2016  . Paresthesia 05/06/2016  . Back pain with radiation 04/04/2016  .  Neuropathic pain of finger of right hand 12/31/2015  . Pap smear of cervix shows high risk HPV present 08/08/2015  . Cyst of left ovary 02/06/2015  . Anovulation 02/06/2015  . Secondary amenorrhea 02/06/2015  . Intractable chronic migraine without aura and without status migrainosus 12/21/2014  . Migraine 09/21/2014  . Anemia 10/11/2013  . H. pylori duodenitis 07/07/2013  . PTSD (post-traumatic stress disorder) 03/26/2013  . Insomnia 03/18/2013  . Seasonal allergies 12/10/2012  . GERD (gastroesophageal reflux disease) 10/29/2012  . Chronic pain syndrome 10/06/2012  . Postlaminectomy syndrome 10/06/2012  . Cervical neck pain with evidence of disc disease 04/13/2012  . Allergic rhinitis 04/17/2010  . Cough variant asthma 03/04/2010  . Prediabetes 01/05/2009  . Essential hypertension 11/27/2007    Past Surgical History:  Procedure Laterality Date  . ANTERIOR CERVICAL DECOMP/DISCECTOMY FUSION  07/07/2012   Procedure: ANTERIOR CERVICAL DECOMPRESSION/DISCECTOMY FUSION 2 LEVELS;  Surgeon: Floyce Stakes, MD;  Location: [facility];  Service: Neurosurgery;  Laterality: N/A;  Cervical four-five, five - six  Anterior cervical decompression/diskectomy/fusion/plate  . APPENDECTOMY  1986  . BOWEL RESECTION N/A 07/29/2013   Procedure: serosal repair;  Surgeon: Adin Hector, MD;  Location: [facility];  Service: General;  Laterality: N/A;  . CARPAL TUNNEL RELEASE Bilateral   . LAPAROSCOPY N/A 07/29/2013   Procedure: diagnostic laporoscopy;  Surgeon: Remo Lipps  C. Gross, MD;  Location: [facility];  Service: General;  Laterality: N/A;  . LAPAROSCOPY N/A 08/16/2013   Procedure: LAPAROSCOPY DIAGNOSTIC/LYSIS OF ADHESIONS;  Surgeon: Adin Hector, MD;  Location: [facility];  Service: General;  Laterality: N/A;  . LAPAROTOMY N/A 08/16/2013   Procedure: EXPLORATORY LAPAROTOMY/SMALL BOWEL RESECTION (JEJUNUM);  Surgeon: Adin Hector, MD;  Location: [facility];  Service: General;  Laterality: N/A;  . LUMBAR SPINE  SURGERY  2010   x 3  . LYSIS OF ADHESION  2003   Dr. Irving Shows  . LYSIS OF ADHESION N/A 07/29/2013   Procedure: LYSIS OF ADHESION;  Surgeon: Adin Hector, MD;  Location: [facility];  Service: General;  Laterality: N/A;  . OOPHORECTOMY    . PARTIAL HYSTERECTOMY  1990s?   [place], [place]  . SPINAL CORD STIMULATOR IMPLANT    . TRIGGER FINGER RELEASE  2009   right pinkie finger  . TUBAL LIGATION  1994    OB History    Gravida  5   Para  4   Term  4   Preterm      AB  1   Living  4     SAB  1   TAB      Ectopic      Multiple      Live Births  4            Home Medications    Prior to Admission medications   Medication Sig Start Date End Date Taking? Authorizing Provider  albuterol (PROVENTIL) (2.5 MG/3ML) 0.083% nebulizer solution Take 3 mLs (2.5 mg total) by nebulization every 6 (six) hours as needed for wheezing or shortness of breath. 02/19/19   Fayrene Helper, MD  albuterol (VENTOLIN HFA) 108 (90 Base) MCG/ACT inhaler TAKE 2 PUFFS BY MOUTH EVERY 6 HOURS AS NEEDED FOR WHEEZE OR SHORTNESS OF BREATH 05/18/19   Fayrene Helper, MD  ALPRAZolam Duanne Moron) 1 MG tablet Take one tablet by mouth once daily for anxiety 08/10/19   Fayrene Helper, MD  amitriptyline (ELAVIL) 150 MG tablet Take 0.5 tablets (75 mg total) by mouth at bedtime. 10/27/19   Raulkar, Clide Deutscher, MD  azelastine (ASTELIN) 0.1 % nasal spray Place 2 sprays into both nostrils 2 (two) times daily. Use in each nostril as directed 02/19/19   Fayrene Helper, MD  Beclomethasone Diprop HFA (QVAR REDIHALER) 40 MCG/ACT AERB Inhale 2 puffs into the lungs 2 (two) times daily. 01/07/17   Fayrene Helper, MD  budesonide-formoterol [facility]) 160-4.5 MCG/ACT inhaler Inhale 2 puffs into the lungs 2 (two) times daily. PRN    [provider]  Cetirizine HCl (ZYRTEC ALLERGY) 10 MG CAPS Take 1 capsule (10 mg total) by mouth 1 day or 1 dose for 1 dose. 12/03/19 12/04/19  Khrystal Jeanmarie, Darrelyn Hillock, FNP   cloNIDine (CATAPRES) 0.1 MG tablet TAKE 1 TABLET BY MOUTH EVERYDAY AT BEDTIME 09/12/19   Fayrene Helper, MD  clotrimazole-betamethasone (LOTRISONE) cream APPLY TO AFFECTED AREA TWICE A DAY 03/08/19   Fayrene Helper, MD  LINZESS 290 MCG CAPS capsule TAKE 1 CAPSULE (290 MCG TOTAL) BY MOUTH DAILY BEFORE BREAKFAST. 06/21/19   Pyrtle, Lajuan Lines, MD  loratadine (CLARITIN) 10 MG tablet TAKE 1 TABLET BY MOUTH EVERY DAY 06/20/19   Perlie Mayo, NP  lubiprostone (AMITIZA) 24 MCG capsule Take 1 capsule (24 mcg total) by mouth 2 (two) times daily. 06/21/19   Pyrtle, Lajuan Lines, MD  montelukast (SINGULAIR) 10 MG tablet  TAKE 1 TABLET BY MOUTH EVERYDAY AT BEDTIME 08/06/19   Fayrene Helper, MD  [facility] 4 MG/0.1ML LIQD nasal spray kit Place 4 mg into the nose as directed. 03/10/18   [provider]  olopatadine (PATANOL) 0.1 % ophthalmic solution Place 1 drop into both eyes 2 (two) times daily. 04/14/19   Fayrene Helper, MD  oxyCODONE (ROXICODONE) 5 MG immediate release tablet Take 2 tablets (10 mg total) by mouth 2 (two) times daily. 11/24/19 11/23/20  Izora Ribas, MD  pantoprazole (PROTONIX) 40 MG tablet TAKE 1 TABLET BY MOUTH EVERY DAY 06/16/19   Pyrtle, Lajuan Lines, MD  potassium chloride 20 MEQ/15ML (10%) SOLN Take 15 mLs (20 mEq total) by mouth 2 (two) times daily. 04/21/19   Fayrene Helper, MD  predniSONE (DELTASONE) 10 MG tablet Take 2 tablets (20 mg total) by mouth daily. 12/03/19   Jrake Rodriquez, Darrelyn Hillock, FNP  solifenacin (VESICARE) 5 MG tablet Take 1 tablet (5 mg total) by mouth daily. 08/11/19   Fayrene Helper, MD  triamterene-hydrochlorothiazide (MAXZIDE-25) 37.5-25 MG tablet TAKE 1 TABLET BY MOUTH EVERY DAY 06/07/19   Fayrene Helper, MD  trolamine salicylate (ASPERCREME) 10 % cream Apply 1 application topically as needed for muscle pain.    [provider]  Vitamin D, Ergocalciferol, (DRISDOL) 50000 units CAPS capsule TAKE 1 CAPSULE (50,000 UNITS TOTAL) BY MOUTH ONCE A WEEK.  Patient not taking: Reported on 11/30/2019 03/12/18   Fayrene Helper, MD    Family History Family History  Problem Relation Age of Onset  . Lung cancer Father   . Stomach cancer Father   . Esophageal cancer Father   . Alcohol abuse Father   . Mental illness Father   . Diabetes Sister   . Hypertension Sister   . Bipolar disorder Sister   . Schizophrenia Sister   . Diabetes Sister   . Alcohol abuse Brother   . Hypertension Brother   . Kidney disease Brother   . Diabetes Brother   . Drug abuse Brother   . Mental illness Brother   . Alcohol abuse Brother   . Alcohol abuse Brother   . Hypertension Brother   . Diabetes Brother   . Alcohol abuse Brother   . Physical abuse Mother   . Alcohol abuse Mother   . Cirrhosis Mother   . Mental illness Brother        in [place]  . Drug abuse Sister   . HIV Sister   . Alcohol abuse Brother   . Pneumonia Sister        died as a baby  . Alcohol abuse Brother   . Bipolar disorder Brother   . Bipolar disorder Daughter   . Bipolar disorder Son   . Bipolar disorder Son   . Hypertension Brother   . Bipolar disorder Brother   . Drug abuse Brother   . Alcohol abuse Brother   . Bipolar disorder Brother   . ADD / ADHD Neg Hx   . Anxiety disorder Neg Hx   . Dementia Neg Hx   . Depression Neg Hx   . OCD Neg Hx   . Seizures Neg Hx   . Paranoid behavior Neg Hx   . Colon cancer Neg Hx     Social History Social History   Tobacco Use  . Smoking status: Never Smoker  . Smokeless tobacco: Never Used  Substance Use Topics  . Alcohol use: No  . Drug use: No  Allergies   Neurontin [gabapentin], Penicillins, Pregabalin, Tramadol, and Latex   Review of Systems Review of Systems  Constitutional: Negative.   HENT: Negative.        Facial swelling  Respiratory: Negative.   Cardiovascular: Negative.   Neurological: Negative.   All other systems reviewed and are negative.    Physical Exam Triage Vital Signs ED Triage Vitals   Enc Vitals Group     BP 12/03/19 1001 126/78     Pulse Rate 12/03/19 1001 98     Resp 12/03/19 1001 18     Temp 12/03/19 1001 98.3 F (36.8 C)     Temp src --      SpO2 12/03/19 1001 97 %     Weight --      Height --      Head Circumference --      Peak Flow --      Pain Score 12/03/19 0959 0     Pain Loc --      Pain Edu? --      Excl. in [place]? --    No data found.  Updated Vital Signs BP 126/78   Pulse 98   Temp 98.3 F (36.8 C)   Resp 18   SpO2 97%   Visual Acuity Right Eye Distance:   Left Eye Distance:   Bilateral Distance:    Right Eye Near:   Left Eye Near:    Bilateral Near:     Physical Exam Constitutional:      General: She is not in acute distress.    Appearance: Normal appearance. She is normal weight. She is not ill-appearing or toxic-appearing.  HENT:     Head: Normocephalic and atraumatic.     Right Ear: Tympanic membrane, ear canal and external ear normal. There is no impacted cerumen.     Left Ear: Tympanic membrane, ear canal and external ear normal. There is no impacted cerumen.     Mouth/Throat:     Lips: Pink.     Mouth: Mucous membranes are moist.     Dentition: No dental caries.     Pharynx: Oropharynx is clear.     Comments: Right upper third molar caries present.  No pain and no abscess. Cardiovascular:     Rate and Rhythm: Normal rate and regular rhythm.     Pulses: Normal pulses.     Heart sounds: Normal heart sounds. No murmur.  Pulmonary:     Effort: Pulmonary effort is normal. No respiratory distress.     Breath sounds: Normal breath sounds. No wheezing.  Chest:     Chest wall: No tenderness.  Neurological:     General: No focal deficit present.     Mental Status: She is alert.     Cranial Nerves: Cranial nerves are intact.     Sensory: Sensation is intact.     Motor: Motor function is intact.     Coordination: Coordination is intact.     Gait: Gait is intact.     Deep Tendon Reflexes:     Reflex Scores:      Patellar  reflexes are 2+ on the right side and 2+ on the left side.     UC Treatments / Results  Labs (all labs ordered are listed, but only abnormal results are displayed) Labs Reviewed - No data to display  EKG   Radiology No results found.  Procedures Procedures (including critical care time)  Medications Ordered in UC Medications - No data to  display  Initial Impression / Assessment and Plan / UC Course  I have reviewed the triage vital signs and the nursing notes.  Pertinent labs & imaging results that were available during my care of the patient were reviewed by me and considered in my medical decision making (see chart for details).   Patient stable for discharge. Prednisone will be prescribed for inflammation Zyrtec for allergy To return for worsening of symptoms Patient verbalized understanding of plan of care  Final Clinical Impressions(s) / UC Diagnoses   Final diagnoses:  Facial swelling     Discharge Instructions     Short-term prednisone was prescribed Zyrtec was prescribed for allergy Follow-up with primary care To return for worsening of symptoms    ED Prescriptions    Medication Sig Dispense Auth. Provider   predniSONE (DELTASONE) 10 MG tablet Take 2 tablets (20 mg total) by mouth daily. 15 tablet Kentrell Guettler, Darrelyn Hillock, FNP   Cetirizine HCl (ZYRTEC ALLERGY) 10 MG CAPS Take 1 capsule (10 mg total) by mouth 1 day or 1 dose for 1 dose. 30 capsule Raeshawn Tafolla, Darrelyn Hillock, FNP     PDMP not reviewed this encounter.   Emerson Monte, Nathalie 12/03/19 1036

## 2019-12-03 NOTE — Discharge Instructions (Addendum)
Short-term prednisone was prescribed Zyrtec was prescribed for allergy Follow-up with primary care To return for worsening of symptoms

## 2019-12-04 LAB — TOXASSURE SELECT,+ANTIDEPR,UR

## 2019-12-06 ENCOUNTER — Ambulatory Visit (INDEPENDENT_AMBULATORY_CARE_PROVIDER_SITE_OTHER): Payer: BC Managed Care – PPO | Admitting: Family Medicine

## 2019-12-06 ENCOUNTER — Ambulatory Visit: Payer: Medicare HMO | Admitting: Family Medicine

## 2019-12-06 ENCOUNTER — Encounter: Payer: Self-pay | Admitting: Family Medicine

## 2019-12-06 ENCOUNTER — Other Ambulatory Visit: Payer: Self-pay

## 2019-12-06 VITALS — BP 126/78 | Ht 66.0 in | Wt 190.0 lb

## 2019-12-06 DIAGNOSIS — I10 Essential (primary) hypertension: Secondary | ICD-10-CM | POA: Diagnosis not present

## 2019-12-06 DIAGNOSIS — J3089 Other allergic rhinitis: Secondary | ICD-10-CM

## 2019-12-06 DIAGNOSIS — F411 Generalized anxiety disorder: Secondary | ICD-10-CM | POA: Diagnosis not present

## 2019-12-06 DIAGNOSIS — J302 Other seasonal allergic rhinitis: Secondary | ICD-10-CM | POA: Diagnosis not present

## 2019-12-06 DIAGNOSIS — G894 Chronic pain syndrome: Secondary | ICD-10-CM

## 2019-12-06 DIAGNOSIS — Z79899 Other long term (current) drug therapy: Secondary | ICD-10-CM | POA: Diagnosis not present

## 2019-12-06 DIAGNOSIS — E559 Vitamin D deficiency, unspecified: Secondary | ICD-10-CM

## 2019-12-06 DIAGNOSIS — J45991 Cough variant asthma: Secondary | ICD-10-CM

## 2019-12-06 DIAGNOSIS — R7301 Impaired fasting glucose: Secondary | ICD-10-CM | POA: Diagnosis not present

## 2019-12-06 DIAGNOSIS — F322 Major depressive disorder, single episode, severe without psychotic features: Secondary | ICD-10-CM

## 2019-12-06 DIAGNOSIS — E669 Obesity, unspecified: Secondary | ICD-10-CM | POA: Diagnosis not present

## 2019-12-06 MED ORDER — ALPRAZOLAM 1 MG PO TABS: ORAL_TABLET | ORAL | 5 refills | Status: DC

## 2019-12-06 NOTE — Patient Instructions (Addendum)
F/U in 4 months, call if you need me before , patient coming by to collect discharge summary and to provide new insurance info  Fasting lipid, cmp and EGFR, TSH, vit D today , [facility]  Please try to get appointment date and time with Dr Arlean Hopping office for patient, an attempt was made on 11/30/2019 to contact her  Info re bariatric surgery is provided  It is important that you exercise regularly at least 30 minutes 5 times a week. If you develop chest pain, have severe difficulty breathing, or feel very tired, stop exercising immediately and seek medical attention  Think about what you will eat, plan ahead. Choose " clean, green, fresh or frozen" over canned, processed or packaged foods which are more sugary, salty and fatty. 70 to 75% of food eaten should be vegetables and fruit. Three meals at set times with snacks allowed between meals, but they must be fruit or vegetables. Aim to eat over a 12 hour period , example 7 am to 7 pm, and STOP after  your last meal of the day. Drink water,generally about 64 ounces per day, no other drink is as healthy. Fruit juice is best enjoyed in a healthy way, by EATING the fruit. Thanks for choosing [facility], we consider it a privelige to serve you.

## 2019-12-06 NOTE — Progress Notes (Signed)
  Virtual Visit via Telephone Note  I connected with [place] on 12/06/19 at  1:20 PM EST by telephone and verified that I am speaking with the correct person using two identifiers.  Location: Patient: home  Provider: office   I discussed the limitations, risks, security and privacy concerns of performing an evaluation and management service by telephone and the availability of in person appointments. I also discussed with the patient that there may be a patient responsible charge related to this service. The patient expressed understanding and agreed to proceed.   History of Present Illness: Bilateral joint pain in both hands , also has bilateral elbow, and knee pain for months awaiting rheumATOLOGY APPT Sinus drainage and cough , no fever or chills but over 1 week duration, on no allergy medication curently and has benefited in the past    Observations/Objective: BP 126/78   Ht 5\' 6"  (1.676 m)   Wt 190 lb (86.2 kg)   BMI 30.67 kg/m   Good communication with no confusion and intact memory. Alert and oriented x 3 No signs of respiratory distress during speech    Assessment and Plan: Seasonal allergies Uncontrolled symptoms currently , start daily singulair  Essential hypertension Controlled, no change in medication DASH diet and commitment to daily physical activity for a minimum of 30 minutes discussed and encouraged, as a part of hypertension management. The importance of attaining a healthy weight is also discussed.  BP/Weight 12/06/2019 12/03/2019 11/30/2019 11/24/2019 09/27/2019 09/20/2019 0000000  Systolic BP 123XX123 123XX123 0000000 [id number] - [id number]  Diastolic BP 78 78 80 90 77 - 82  Wt. (Lbs) 190 - 189.8 192 179 179 177  BMI 30.67 - 30.63 30.99 28.89 28.89 28.57  Some encounter information is confidential and restricted. Go to Review Flowsheets activity to see all data.       Cough variant asthma Controlled, no change in medication   Allergic rhinitis Uncontrolled ,  start daily singulair  Obesity (BMI 30.0-34.9)  Patient re-educated about  the importance of commitment to a  minimum of 150 minutes of exercise per week as able.  The importance of healthy food choices with portion control discussed, as well as eating regularly and within a 12 hour window most days. The need to choose "clean , green" food 50 to 75% of the time is discussed, as well as to make water the primary drink and set a goal of 64 ounces water daily.    Weight /BMI 12/06/2019 11/30/2019 11/24/2019  WEIGHT 190 lb 189 lb 12.8 oz 192 lb  HEIGHT 5\' 6"  5\' 6"  5\' 6"   BMI 30.67 kg/m2 30.63 kg/m2 30.99 kg/m2  Some encounter information is confidential and restricted. Go to Review Flowsheets activity to see all data.    Info re bariatric surgery provided, has failed phentermine and continues to gain  weight  Chronic pain syndrome Managed by pain clinic  GAD (generalized anxiety disorder) Controlled, no change in medication    Follow Up Instructions:    I discussed the assessment and treatment plan with the patient. The patient was provided an opportunity to ask questions and all were answered. The patient agreed with the plan and demonstrated an understanding of the instructions.   The patient was advised to call back or seek an in-person evaluation if the symptoms worsen or if the condition fails to improve as anticipated.  I provided  20 minutes of non-face-to-face time during this encounter.   Tula Nakayama, MD

## 2019-12-07 LAB — COMPLETE METABOLIC PANEL WITH GFR
AG Ratio: 1.4 (calc) (ref 1.0–2.5)
ALT: 32 U/L — ABNORMAL HIGH (ref 6–29)
AST: 25 U/L (ref 10–35)
Albumin: 4.3 g/dL (ref 3.6–5.1)
Alkaline phosphatase (APISO): 57 U/L (ref 37–153)
BUN: 13 mg/dL (ref 7–25)
CO2: 26 mmol/L (ref 20–32)
Calcium: 9.7 mg/dL (ref 8.6–10.4)
Chloride: 104 mmol/L (ref 98–110)
Creat: 1.03 mg/dL (ref 0.50–1.05)
GFR, Est African American: 73 mL/min/{1.73_m2} (ref >=60)
GFR, Est Non African American: 63 mL/min/{1.73_m2} (ref >=60)
Globulin: 3 g/dL (calc) (ref 1.9–3.7)
Glucose, Bld: 100 mg/dL — ABNORMAL HIGH (ref 65–99)
Potassium: 3.7 mmol/L (ref 3.5–5.3)
Sodium: 138 mmol/L (ref 135–146)
Total Bilirubin: 0.3 mg/dL (ref 0.2–1.2)
Total Protein: 7.3 g/dL (ref 6.1–8.1)

## 2019-12-07 LAB — LIPID PANEL
Cholesterol: 195 mg/dL (ref <200)
HDL: 55 mg/dL (ref >=50)
LDL Cholesterol (Calc): 117 mg/dL (calc) — ABNORMAL HIGH
Non-HDL Cholesterol (Calc): 140 mg/dL (calc) — ABNORMAL HIGH (ref <130)
Total CHOL/HDL Ratio: 3.5 (calc) (ref <5.0)
Triglycerides: 124 mg/dL (ref <150)

## 2019-12-07 LAB — VITAMIN D 25 HYDROXY (VIT D DEFICIENCY, FRACTURES): Vit D, 25-Hydroxy: 12 ng/mL — ABNORMAL LOW (ref 30–100)

## 2019-12-07 LAB — TSH: TSH: 1.74 mIU/L

## 2019-12-08 ENCOUNTER — Other Ambulatory Visit: Payer: Self-pay | Admitting: Family Medicine

## 2019-12-08 ENCOUNTER — Encounter: Payer: Self-pay | Admitting: Family Medicine

## 2019-12-08 ENCOUNTER — Other Ambulatory Visit: Payer: Self-pay

## 2019-12-08 MED ORDER — ERGOCALCIFEROL 1.25 MG (50000 UT) PO CAPS: 50000.0000 [IU] | ORAL_CAPSULE | ORAL | 1 refills | Status: DC

## 2019-12-08 MED ORDER — LORATADINE 10 MG PO TABS: 10.0000 mg | ORAL_TABLET | Freq: Every day | ORAL | 1 refills | Status: DC

## 2019-12-08 NOTE — Progress Notes (Signed)
vit

## 2019-12-14 ENCOUNTER — Other Ambulatory Visit: Payer: Self-pay | Admitting: Physical Medicine and Rehabilitation

## 2019-12-14 ENCOUNTER — Telehealth: Payer: Self-pay

## 2019-12-14 ENCOUNTER — Other Ambulatory Visit: Payer: Self-pay | Admitting: Family Medicine

## 2019-12-14 ENCOUNTER — Other Ambulatory Visit: Payer: Self-pay

## 2019-12-14 DIAGNOSIS — M961 Postlaminectomy syndrome, not elsewhere classified: Secondary | ICD-10-CM

## 2019-12-14 MED ORDER — ALBUTEROL SULFATE (2.5 MG/3ML) 0.083% IN NEBU: 2.5000 mg | INHALATION_SOLUTION | Freq: Four times a day (QID) | RESPIRATORY_TRACT | 1 refills | Status: DC | PRN

## 2019-12-14 MED ORDER — QVAR REDIHALER 40 MCG/ACT IN AERB: 2.0000 | INHALATION_SPRAY | Freq: Two times a day (BID) | RESPIRATORY_TRACT | 5 refills | Status: DC

## 2019-12-14 MED ORDER — BUDESONIDE-FORMOTEROL FUMARATE 160-4.5 MCG/ACT IN AERO: 2.0000 | INHALATION_SPRAY | Freq: Two times a day (BID) | RESPIRATORY_TRACT | 3 refills | Status: DC

## 2019-12-14 MED ORDER — ALBUTEROL SULFATE HFA 108 (90 BASE) MCG/ACT IN AERS: INHALATION_SPRAY | RESPIRATORY_TRACT | 4 refills | Status: DC

## 2019-12-14 NOTE — Telephone Encounter (Signed)
Medications refilled and sent to pharmacy. Patient doesn't want to speak with me. The weight loss surgeons office calls the insurance company to get the authorization for the surgery, not [place]. We don't have any of the information that the insurance company will need. Please call patient and let her know. Thank you.

## 2019-12-14 NOTE — Telephone Encounter (Signed)
Pt needs all 3 inhalers & medication for Nebulizer called in  And she needs someone to call  [employer] to authorize weight loss surgery [id number]

## 2019-12-14 NOTE — Telephone Encounter (Signed)
I called Pt advised that the rx was called in, also requested more information regarding the auth for the surgery , I asked was it a referral, and she advised no,   I asked for the person she was speaking to, it was  [employer], [facility] [phone].  I advised the PT I would reach out to Estanislado Pandy [place] to [facility] to confirm what was needed and with DEVESHWAR, [facility] to see if there was an earlier appt.

## 2019-12-15 ENCOUNTER — Encounter: Payer: Self-pay | Admitting: *Deleted

## 2019-12-15 NOTE — Assessment & Plan Note (Signed)
Controlled, no change in medication  

## 2019-12-15 NOTE — Assessment & Plan Note (Signed)
Controlled, no change in medication DASH diet and commitment to daily physical activity for a minimum of 30 minutes discussed and encouraged, as a part of hypertension management. The importance of attaining a healthy weight is also discussed.  BP/Weight 12/06/2019 12/03/2019 11/30/2019 11/24/2019 09/27/2019 09/20/2019 0000000  Systolic BP 123XX123 123XX123 0000000 [id number] - 0000000  Diastolic BP 78 78 80 90 77 - 82  Wt. (Lbs) 190 - 189.8 192 179 179 177  BMI 30.67 - 30.63 30.99 28.89 28.89 28.57  Some encounter information is confidential and restricted. Go to Review Flowsheets activity to see all data.

## 2019-12-15 NOTE — Assessment & Plan Note (Signed)
Uncontrolled symptoms currently , start daily singulair

## 2019-12-15 NOTE — Assessment & Plan Note (Signed)
Uncontrolled , start daily singulair 

## 2019-12-15 NOTE — Assessment & Plan Note (Signed)
Managed by pain clinic 

## 2019-12-15 NOTE — Assessment & Plan Note (Signed)
  Patient re-educated about  the importance of commitment to a  minimum of 150 minutes of exercise per week as able.  The importance of healthy food choices with portion control discussed, as well as eating regularly and within a 12 hour window most days. The need to choose "clean , green" food 50 to 75% of the time is discussed, as well as to make water the primary drink and set a goal of 64 ounces water daily.    Weight /BMI 12/06/2019 11/30/2019 11/24/2019  WEIGHT 190 lb 189 lb 12.8 oz 192 lb  HEIGHT 5\' 6"  5\' 6"  5\' 6"   BMI 30.67 kg/m2 30.63 kg/m2 30.99 kg/m2  Some encounter information is confidential and restricted. Go to Review Flowsheets activity to see all data.    Info re bariatric surgery provided, has failed phentermine and continues to gain  weight

## 2019-12-16 ENCOUNTER — Encounter: Payer: Medicare HMO | Attending: Family Medicine | Admitting: Nutrition

## 2019-12-16 ENCOUNTER — Other Ambulatory Visit: Payer: Self-pay

## 2019-12-16 ENCOUNTER — Encounter: Payer: Self-pay | Admitting: Nutrition

## 2019-12-16 VITALS — Ht 66.0 in | Wt 191.6 lb

## 2019-12-16 DIAGNOSIS — I10 Essential (primary) hypertension: Secondary | ICD-10-CM | POA: Insufficient documentation

## 2019-12-16 DIAGNOSIS — E663 Overweight: Secondary | ICD-10-CM

## 2019-12-16 DIAGNOSIS — R7303 Prediabetes: Secondary | ICD-10-CM | POA: Insufficient documentation

## 2019-12-16 NOTE — Progress Notes (Signed)
Medical Nutrition Therapy:  Appt start time: 1300 end time:  [id number].  Assessment:  Primary concerns today: Overweight-weight gain., Pre DM. A1C 5.8%.  Has been diabetic in the past. Is considering  lap band or gastric bypass for desired weight loss. DId watach videos for gastric bypass or sleeve. Is waiting for paperwork to do the surgery.     Drinks water, eats and loves cherries.  Has cut out eating out and fast foods. Cooking more foods at home. Loves vegetables and fresh fruits.     Tends to skip meals which is limiting her success with weight loss. Drinks water. Likes to be active.       Willing to work on changing eating habits and eating consistent meals to lose weight and become healthier. Will try to work on behaviors and lifestyle for desired weight loss for about 6 months before she decides on bariatric surgery.  Was on phenermine and lost some weight in past. Has done the drink the shrink.  BMI is 30. Losing 10% of her weight would be a reasonable goal within 6 months.. CMP Latest Ref Rng & Units 12/06/2019 04/21/2019 02/04/2019  Glucose 65 - 99 mg/dL 100(H) 90 84  BUN 7 - 25 mg/dL 13 11 10   Creatinine 0.50 - 1.05 mg/dL 1.03 1.00 1.06(H)  Sodium 135 - 146 mmol/L 138 139 136  Potassium 3.5 - 5.3 mmol/L 3.7 3.8 3.4(L)  Chloride 98 - 110 mmol/L 104 103 102  CO2 20 - 32 mmol/L 26 28 25   Calcium 8.6 - 10.4 mg/dL 9.7 9.3 9.3  Total Protein 6.1 - 8.1 g/dL 7.3 7.2 7.3  Total Bilirubin 0.2 - 1.2 mg/dL 0.3 0.4 0.6  Alkaline Phos 38 - 126 U/L - - 48  AST 10 - 35 U/L 25 28 25   ALT 6 - 29 U/L 32(H) 34(H) 39   Lipid Panel     Component Value Date/Time   CHOL 195 12/06/2019 1508   TRIG 124 12/06/2019 1508   HDL 55 12/06/2019 1508   CHOLHDL 3.5 12/06/2019 1508   VLDL 26 06/09/2017 0846   LDLCALC 117 (H) 12/06/2019 1508   Wt Readings from Last 3 Encounters:  12/16/19 191 lb 9.6 oz (86.9 kg)  12/06/19 190 lb (86.2 kg)  11/30/19 189 lb 12.8 oz (86.1 kg)   Ht Readings from Last 3  Encounters:  12/16/19 5\' 6"  (1.676 m)  12/06/19 5\' 6"  (1.676 m)  11/30/19 5\' 6"  (1.676 m)   Body mass index is 30.93 kg/m. @BMIFA @ Facility age limit for growth percentiles is 20 years. Facility age limit for growth percentiles is 20 years.   Lab Results  Component Value Date   HGBA1C 5.8 (H) 04/21/2019   CMP Latest Ref Rng & Units 12/06/2019 04/21/2019 02/04/2019  Glucose 65 - 99 mg/dL 100(H) 90 84  BUN 7 - 25 mg/dL 13 11 10   Creatinine 0.50 - 1.05 mg/dL 1.03 1.00 1.06(H)  Sodium 135 - 146 mmol/L 138 139 136  Potassium 3.5 - 5.3 mmol/L 3.7 3.8 3.4(L)  Chloride 98 - 110 mmol/L 104 103 102  CO2 20 - 32 mmol/L 26 28 25   Calcium 8.6 - 10.4 mg/dL 9.7 9.3 9.3  Total Protein 6.1 - 8.1 g/dL 7.3 7.2 7.3  Total Bilirubin 0.2 - 1.2 mg/dL 0.3 0.4 0.6  Alkaline Phos 38 - 126 U/L - - 48  AST 10 - 35 U/L 25 28 25   ALT 6 - 29 U/L 32(H) 34(H) 39   Lipid Panel  Component Value Date/Time   CHOL 195 12/06/2019 1508   TRIG 124 12/06/2019 1508   HDL 55 12/06/2019 1508   CHOLHDL 3.5 12/06/2019 1508   VLDL 26 06/09/2017 0846   LDLCALC 117 (H) 12/06/2019 1508     Preferred Learning Style:    No preference indicated   Learning Readiness:   Ready  Change in progress   MEDICATIONS:    DIETARY INTAKE:    24-hr recall:  B ( AM): Frosted flakes, juice,  Snk ( AM):  L ( PM):skips lunch Snk ( PM):  D ( PM): PIzza 2 slices, water Snk ( PM): misc  Beverages: diet soda, tea or soda, water  Usual physical activity: labs   Estimated energy needs: 1200  calories 130 g carbohydrates 90 g protein 33 g fat  Progress Towards Goal(s):  In progress.   Nutritional Diagnosis:  NB-1.1 Food and nutrition-related knowledge deficit As related to Pre Dm and weight gain.  As evidenced by BMI 28 and A1C 5.8%.    Intervention: Nutrition and Pre Diabetes education provided on My Plate, CHO counting, meal planning, portion sizes, timing of meals, avoiding snacks between meals unless having a  low blood sugar, target ranges for A1C and blood sugars, signs/symptoms and treatment of hyper/hypoglycemia, monitoring blood sugars, taking medications as prescribed, benefits of exercising 30 -60 minutes per day and prevention of DM. Stressed the need for a plant based diet and avoiding processed foods and increase foods high in phytochemicals and more fresh fruits and vegetables.   Goals  Follow The Plate Method Eat on meals on time Cut out snacks Exercise 30 minutes three times per week. Drink only water and cut out flavored water or sf beverages. Lose 1-2 lbs pr week Do not skip meals. Increase fresh fruits and vegetables.  Teaching Method Utilized:  Visual Auditory Hands on  Handouts given during visit include:  The Plate Method   Meal Plan Card   Barriers to learning/adherence to lifestyle change: none  Demonstrated degree of understanding via:  Teach Back   Monitoring/Evaluation:  Dietary intake, exercise, and body weight in 1 month(s).

## 2019-12-16 NOTE — Patient Instructions (Addendum)
Goals   Follow The Plate Method Eat on meals on time Cut out snacks Exercise 30 minutes three times per week. Drink only water and cut out flavored water or sf beverages. Lose 1-2 lbs pr week Do not skip meals. Increase fresh fruits and vegetables.

## 2019-12-16 NOTE — Progress Notes (Signed)
Office Visit Note  Patient: Jane English             Date of Birth: 19-Apr-1971           MRN: [id number]             PCP: Fayrene Helper, MD Referring: Fayrene Helper, MD Visit Date: 12/17/2019 Occupation: @GUAROCC @  Subjective:  Pain in multiple joints   History of Present Illness: Jane English is a 49 y.o. female seen in consultation per request of Dr. Moshe Cipro for pain in multiple joints.  According to patient she has had history of disc disease of cervical and lumbar spine for many years.  She had cervical fusion and lumbar spine fusion by Dr. Joya Salm in the past.  She also has a stim unit for her lower back.  She gets frequent injections at pain clinic.  She also has been going to pain clinic for chronic pain.  She states in the last 4 months the pain has been worse in her shoulders, elbows, hands and her knees.  She has not noticed any joint swelling.  She describes pain over her right elbow and she wears a compression sleeve.  She also wears compression gloves and compression socks to relieve the discomfort.  She has seen Dr. Milinda Pointer in the past for discomfort in her feet.  She states she does not have any joint pain but has some nerve pain in her feet.  She has had cortisone injections in her feet in the past.  She also gives history of bilateral carpal tunnel release in the past.  She was recently diagnosed with vitamin D deficiency and she started taking vitamin D.  There is history of DVT in the past.  There is also history of 1 miscarriage.  There is no family history of autoimmune disease.  Activities of Daily Living:  Patient reports morning stiffness for 24 hours.   Patient Reports nocturnal pain.  Difficulty dressing/grooming: Denies Difficulty climbing stairs: Denies Difficulty getting out of chair: Denies Difficulty using hands for taps, buttons, cutlery, and/or writing: Denies  Review of Systems  Constitutional: Negative for fatigue, night sweats, weight gain and  weight loss.  HENT: Positive for mouth dryness. Negative for mouth sores, trouble swallowing, trouble swallowing and nose dryness.   Eyes: Positive for dryness. Negative for pain, redness and visual disturbance.  Respiratory: Negative for cough, shortness of breath and difficulty breathing.   Cardiovascular: Positive for swelling in legs/feet. Negative for chest pain, palpitations, hypertension and irregular heartbeat.  Gastrointestinal: Negative for blood in stool, constipation and diarrhea.  Endocrine: Negative for cold intolerance, heat intolerance and increased urination.  Genitourinary: Negative for difficulty urinating and vaginal dryness.  Musculoskeletal: Positive for arthralgias, joint pain, myalgias, morning stiffness and myalgias. Negative for gait problem, joint swelling, muscle weakness and muscle tenderness.  Skin: Negative for color change, rash, hair loss, skin tightness, ulcers and sensitivity to sunlight.  Allergic/Immunologic: Negative for susceptible to infections.  Neurological: Positive for numbness. Negative for dizziness, memory loss, night sweats and weakness.  Hematological: Positive for bruising/bleeding tendency. Negative for swollen glands.  Psychiatric/Behavioral: Negative for depressed mood and sleep disturbance. The patient is nervous/anxious.     PMFS History:  Patient Active Problem List   Diagnosis Date Noted  . Urinary urgency 08/11/2019  . GAD (generalized anxiety disorder) 04/26/2019  . Depression, major, single episode, severe ([place]) 10/03/2018  . Vitamin D deficiency 12/14/2017  . Bilateral ankle pain 04/16/2017  . Amenorrhea  10/15/2016  . Paresthesia 05/06/2016  . Back pain with radiation 04/04/2016  . Obesity (BMI 30.0-34.9) 03/25/2016  . Neuropathic pain of finger of right hand 12/31/2015  . Pap smear of cervix shows high risk HPV present 08/08/2015  . Cyst of left ovary 02/06/2015  . Anovulation 02/06/2015  . Secondary amenorrhea 02/06/2015    . Intractable chronic migraine without aura and without status migrainosus 12/21/2014  . Migraine 09/21/2014  . Anemia 10/11/2013  . H. pylori duodenitis 07/07/2013  . PTSD (post-traumatic stress disorder) 03/26/2013  . Insomnia 03/18/2013  . Seasonal allergies 12/10/2012  . GERD (gastroesophageal reflux disease) 10/29/2012  . Chronic pain syndrome 10/06/2012  . Postlaminectomy syndrome 10/06/2012  . Cervical neck pain with evidence of disc disease 04/13/2012  . Allergic rhinitis 04/17/2010  . Cough variant asthma 03/04/2010  . Prediabetes 01/05/2009  . Essential hypertension 11/27/2007    Past Medical History:  Diagnosis Date  . Anemia   . Asthma   . Asthma flare 04/09/2013  . Back pain   . Bronchitis   . Chronic abdominal pain   . Chronic constipation   . Constipation due to opioid therapy   . Depression   . Diabetes mellitus without complication ([place])   . Diabetes mellitus, type II ([place])   . DVT (deep venous thrombosis) ([place]) 2010  . GERD (gastroesophageal reflux disease)   . Heart murmur    no cardiologist  . Helicobacter pylori gastritis 06/11/2013   Colonoscopy Dr. Hilarie Fredrickson  . Hypertension   . IBS (irritable bowel syndrome)   . Migraine headache   . Neuropathy   . Obesity   . Obsessive-compulsive disorder   . PSYCHOTIC D/O W/HALLUCINATIONS CONDS CLASS ELSW 03/04/2010   Qualifier: Diagnosis of  By: Moshe Cipro MD, Joycelyn Schmid    . PTSD (post-traumatic stress disorder)   . SBO (small bowel obstruction) ([place]) 08/09/2013  . Seasonal allergies 12/10/2012  . Seizures ([place])   . Shortness of breath     Family History  Problem Relation Age of Onset  . Lung cancer Father   . Stomach cancer Father   . Esophageal cancer Father   . Alcohol abuse Father   . Mental illness Father   . Diabetes Sister   . Hypertension Sister   . Bipolar disorder Sister   . Schizophrenia Sister   . Diabetes Sister   . Alcohol abuse Brother   . Hypertension Brother   . Kidney disease Brother   .  Diabetes Brother   . Drug abuse Brother   . Mental illness Brother   . Alcohol abuse Brother   . Alcohol abuse Brother   . Hypertension Brother   . Diabetes Brother   . Alcohol abuse Brother   . Physical abuse Mother   . Alcohol abuse Mother   . Cirrhosis Mother   . Mental illness Brother        in [place]  . Drug abuse Sister   . HIV Sister   . Alcohol abuse Brother   . Pneumonia Sister        died as a baby  . Alcohol abuse Brother   . Bipolar disorder Brother   . Bipolar disorder Daughter   . Bipolar disorder Son   . Bipolar disorder Son   . Hypertension Brother   . Bipolar disorder Brother   . Drug abuse Brother   . Alcohol abuse Brother   . Bipolar disorder Brother   . ADD / ADHD Neg Hx   . Anxiety disorder Neg  Hx   . Dementia Neg Hx   . Depression Neg Hx   . OCD Neg Hx   . Seizures Neg Hx   . Paranoid behavior Neg Hx   . Colon cancer Neg Hx    Past Surgical History:  Procedure Laterality Date  . ANTERIOR CERVICAL DECOMP/DISCECTOMY FUSION  07/07/2012   Procedure: ANTERIOR CERVICAL DECOMPRESSION/DISCECTOMY FUSION 2 LEVELS;  Surgeon: Floyce Stakes, MD;  Location: [facility];  Service: Neurosurgery;  Laterality: N/A;  Cervical four-five, five - six  Anterior cervical decompression/diskectomy/fusion/plate  . APPENDECTOMY  1986  . BOWEL RESECTION N/A 07/29/2013   Procedure: serosal repair;  Surgeon: Adin Hector, MD;  Location: [facility];  Service: General;  Laterality: N/A;  . CARPAL TUNNEL RELEASE Bilateral   . LAPAROSCOPY N/A 07/29/2013   Procedure: diagnostic laporoscopy;  Surgeon: Adin Hector, MD;  Location: [facility];  Service: General;  Laterality: N/A;  . LAPAROSCOPY N/A 08/16/2013   Procedure: LAPAROSCOPY DIAGNOSTIC/LYSIS OF ADHESIONS;  Surgeon: Adin Hector, MD;  Location: [facility];  Service: General;  Laterality: N/A;  . LAPAROTOMY N/A 08/16/2013   Procedure: EXPLORATORY LAPAROTOMY/SMALL BOWEL RESECTION (JEJUNUM);  Surgeon: Adin Hector, MD;  Location:  [facility];  Service: General;  Laterality: N/A;  . LUMBAR SPINE SURGERY  2010   x 3  . LYSIS OF ADHESION  2003   Dr. Irving Shows  . LYSIS OF ADHESION N/A 07/29/2013   Procedure: LYSIS OF ADHESION;  Surgeon: Adin Hector, MD;  Location: [facility];  Service: General;  Laterality: N/A;  . OOPHORECTOMY    . PARTIAL HYSTERECTOMY  1990s?   [place], [place]  . SPINAL CORD STIMULATOR IMPLANT    . TRIGGER FINGER RELEASE  2009   right pinkie finger  . TUBAL LIGATION  1994   Social History   Social History Narrative  . Not on file   Immunization History  Administered Date(s) Administered  . Influenza Split 09/03/2012  . Influenza Whole 07/28/2007, 08/11/2009, 07/19/2010  . Influenza,inj,Quad PF,6+ Mos 09/13/2013, 08/12/2014, 08/01/2015, 07/24/2016, 08/14/2017, 08/12/2018, 08/10/2019  . PPD Test 02/22/2011, 06/26/2011, 09/18/2012, 11/29/2013, 12/06/2014, 12/09/2017  . Td 10/03/2005  . Tdap 12/09/2017     Objective: Vital Signs: BP 102/63 (BP Location: Right Arm, Patient Position: Sitting, Cuff Size: Normal)   Pulse 96   Resp 14   Ht 5\' 6"  (1.676 m)   Wt 193 lb 3.2 oz (87.6 kg)   BMI 31.18 kg/m    Physical Exam Vitals and nursing note reviewed.  Constitutional:      Appearance: She is well-developed.  HENT:     Head: Normocephalic and atraumatic.  Eyes:     Conjunctiva/sclera: Conjunctivae normal.  Cardiovascular:     Rate and Rhythm: Normal rate and regular rhythm.     Heart sounds: Normal heart sounds.  Pulmonary:     Effort: Pulmonary effort is normal.     Breath sounds: Normal breath sounds.  Abdominal:     General: Bowel sounds are normal.     Palpations: Abdomen is soft.  Musculoskeletal:     Cervical back: Normal range of motion.  Lymphadenopathy:     Cervical: No cervical adenopathy.  Skin:    General: Skin is warm and dry.     Capillary Refill: Capillary refill takes less than 2 seconds.  Neurological:     Mental Status: She is alert and oriented to person,  place, and time.  Psychiatric:        Behavior: Behavior normal.  Musculoskeletal Exam: She has limited range of motion of her cervical thoracic and lumbar spine.  She has discomfort range of motion of bilateral shoulders.  She had tenderness over right lateral epicondyle.  She had good range of motion of her wrist joints.  She has postsurgical limited range of motion in her right fifth phalanx.  She has DIP thickening in bilateral hands with no synovitis.  Hip joints with good range of motion.  Knee joints with good range of motion without any warmth swelling or effusion.  She has discomfort range of motion of her knee joints.  She has some prominence of bilateral first MTP PIP and DIP joints with no synovitis.  CDAI Exam: CDAI Score: -- Patient Global: --; Provider Global: -- Swollen: --; Tender: -- Joint Exam 12/17/2019   No joint exam has been documented for this visit   There is currently no information documented on the homunculus. Go to the Rheumatology activity and complete the homunculus joint exam.  Investigation: No additional findings.  Imaging: XR Hand 2 View Left  Result Date: 12/17/2019 CMC, PIP and DIP narrowing was noted.  No MCP, intercarpal radiocarpal joint space narrowing was noted.  No erosive changes were noted. Impression: These findings are consistent with osteoarthritis of the hand.  XR Hand 2 View Right  Result Date: 12/17/2019 CMC, PIP and DIP narrowing was noted.  Postsurgical changes were noted in her right fifth proximal phalanx.  No MCP, intercarpal radiocarpal joint space narrowing was noted.  No erosive changes were noted. Impression: These findings are consistent with osteoarthritis of the hand.  XR KNEE 3 VIEW LEFT  Result Date: 12/17/2019 No medial lateral compartment narrowing was noted.  No chondrocalcinosis was noted.  Moderate patellofemoral narrowing was noted.  Raised tibial tubercle noted consistent with Osgood-Schlatter . Impression:  These findings are consistent with moderate chondromalacia patella.  XR KNEE 3 VIEW RIGHT  Result Date: 12/17/2019 No medial lateral compartment narrowing was noted.  No chondrocalcinosis was noted.  Mild patellofemoral narrowing was noted. Impression: These findings are consistent with mild chondromalacia patella.  XR Shoulder Left  Result Date: 12/17/2019 No glenohumeral or acromioclavicular joint space narrowing was noted.  No chondrocalcinosis was noted. Impression: Unremarkable x-ray of the shoulder joint.  XR Shoulder Right  Result Date: 12/17/2019 No glenohumeral or acromioclavicular joint space narrowing was noted.  No chondrocalcinosis was noted. Impression: Unremarkable x-ray of the shoulder joint.   Recent Labs: Lab Results  Component Value Date   WBC 5.7 08/17/2019   HGB 12.3 08/17/2019   PLT 315 08/17/2019   NA 138 12/06/2019   K 3.7 12/06/2019   CL 104 12/06/2019   CO2 26 12/06/2019   GLUCOSE 100 (H) 12/06/2019   BUN 13 12/06/2019   CREATININE 1.03 12/06/2019   BILITOT 0.3 12/06/2019   ALKPHOS 48 02/04/2019   AST 25 12/06/2019   ALT 32 (H) 12/06/2019   PROT 7.3 12/06/2019   ALBUMIN 4.1 02/04/2019   CALCIUM 9.7 12/06/2019   GFRAA 73 12/06/2019    Speciality Comments: No specialty comments available.  Procedures:  No procedures performed Allergies: Neurontin [gabapentin], Penicillins, Pregabalin, Tramadol, and Latex   Assessment / Plan:     Visit Diagnoses: Polyarthralgia-patient complains of pain and discomfort in multiple joints.  Chronic pain of both shoulders -she has discomfort range of motion of bilateral shoulders.  No warmth or effusion was noted.  Plan: XR Shoulder Left, XR Shoulder Right.  X-ray of bilateral shoulder joints were unremarkable.  Pain in  both hands -she complains of pain and discomfort in her bilateral hands.  She had bilateral carpal tunnel release in the past.  She has DIP thickening on examination with no synovitis today.  Plan:  XR Hand 2 View Right, XR Hand 2 View Left x-ray findings and clinical findings are consistent with osteoarthritis of bilateral hands.  She gives history of intermittent swelling in her hands.  I will obtain additional labs today.  Chronic pain of both knees -he complains of discomfort in the bilateral knee joints.  No warmth swelling or effusion was noted.  Plan: XR KNEE 3 VIEW RIGHT, XR KNEE 3 VIEW LEFT.  X-ray findings are consistent with chondromalacia patella.  Chronic pain syndrome - Followed by pain management.  DDD (degenerative disc disease), cervical - C4-C6 fusion by Dr. Joya Salm.  She continues to have chronic pain.  DDD (degenerative disc disease), lumbar - L5-S1 fusion by Dr. Joya Salm.  She has a stim unit despite of that she is a lot of discomfort.  Vitamin D deficiency-she is extremely low vitamin D level.  She recently picked up the prescription.  Essential hypertension-blood pressure is well controlled.  Other medical problems are listed as follows:  History of diabetes mellitus, type II  History of gastroesophageal reflux (GERD)  History of IBS  History of DVT (deep vein thrombosis)  Anxiety and depression  Orders: Orders Placed This Encounter  Procedures  . XR Shoulder Left  . XR Shoulder Right  . XR KNEE 3 VIEW RIGHT  . XR KNEE 3 VIEW LEFT  . XR Hand 2 View Right  . XR Hand 2 View Left  . Rheumatoid factor  . Sedimentation rate  . Uric acid  . Cyclic citrul peptide antibody, IgG  . ANA   No orders of the defined types were placed in this encounter.   Face-to-face time spent with patient was 50 minutes. Greater than 50% of time was spent in counseling and coordination of care.  Follow-Up Instructions: Return for Osteoarthritis, DDD, polyarthralgia.   Bo Merino, MD  Note - This record has been created using Editor, commissioning.  Chart creation errors have been sought, but may not always  have been located. Such creation errors do not reflect on   the standard of medical care.

## 2019-12-17 ENCOUNTER — Ambulatory Visit: Payer: Self-pay

## 2019-12-17 ENCOUNTER — Ambulatory Visit (INDEPENDENT_AMBULATORY_CARE_PROVIDER_SITE_OTHER): Payer: Medicare HMO

## 2019-12-17 ENCOUNTER — Ambulatory Visit: Payer: Medicare HMO | Admitting: Rheumatology

## 2019-12-17 ENCOUNTER — Encounter: Payer: Self-pay | Admitting: Rheumatology

## 2019-12-17 VITALS — BP 102/63 | HR 96 | Resp 14 | Ht 66.0 in | Wt 193.2 lb

## 2019-12-17 DIAGNOSIS — M25561 Pain in right knee: Secondary | ICD-10-CM

## 2019-12-17 DIAGNOSIS — E559 Vitamin D deficiency, unspecified: Secondary | ICD-10-CM | POA: Diagnosis not present

## 2019-12-17 DIAGNOSIS — M5136 Other intervertebral disc degeneration, lumbar region: Secondary | ICD-10-CM | POA: Diagnosis not present

## 2019-12-17 DIAGNOSIS — G8929 Other chronic pain: Secondary | ICD-10-CM

## 2019-12-17 DIAGNOSIS — M79642 Pain in left hand: Secondary | ICD-10-CM

## 2019-12-17 DIAGNOSIS — M79641 Pain in right hand: Secondary | ICD-10-CM | POA: Diagnosis not present

## 2019-12-17 DIAGNOSIS — M25512 Pain in left shoulder: Secondary | ICD-10-CM

## 2019-12-17 DIAGNOSIS — M5030 Other cervical disc degeneration, unspecified cervical region: Secondary | ICD-10-CM

## 2019-12-17 DIAGNOSIS — G894 Chronic pain syndrome: Secondary | ICD-10-CM

## 2019-12-17 DIAGNOSIS — M25511 Pain in right shoulder: Secondary | ICD-10-CM | POA: Diagnosis not present

## 2019-12-17 DIAGNOSIS — F419 Anxiety disorder, unspecified: Secondary | ICD-10-CM

## 2019-12-17 DIAGNOSIS — M2550 Pain in unspecified joint: Secondary | ICD-10-CM

## 2019-12-17 DIAGNOSIS — M25562 Pain in left knee: Secondary | ICD-10-CM

## 2019-12-17 DIAGNOSIS — Z86718 Personal history of other venous thrombosis and embolism: Secondary | ICD-10-CM

## 2019-12-17 DIAGNOSIS — F32A Depression, unspecified: Secondary | ICD-10-CM

## 2019-12-17 DIAGNOSIS — I10 Essential (primary) hypertension: Secondary | ICD-10-CM

## 2019-12-17 DIAGNOSIS — F329 Major depressive disorder, single episode, unspecified: Secondary | ICD-10-CM

## 2019-12-17 DIAGNOSIS — Z8639 Personal history of other endocrine, nutritional and metabolic disease: Secondary | ICD-10-CM

## 2019-12-17 DIAGNOSIS — Z8719 Personal history of other diseases of the digestive system: Secondary | ICD-10-CM

## 2019-12-20 ENCOUNTER — Telehealth: Payer: Self-pay | Admitting: Rheumatology

## 2019-12-20 LAB — ANA: Anti Nuclear Antibody (ANA): POSITIVE — AB

## 2019-12-20 LAB — RHEUMATOID FACTOR: Rheumatoid fact SerPl-aCnc: <14 IU/mL (ref <14)

## 2019-12-20 LAB — URIC ACID: Uric Acid, Serum: 5.4 mg/dL (ref 2.5–7.0)

## 2019-12-20 LAB — ANTI-NUCLEAR AB-TITER (ANA TITER): ANA Titer 1: 1:40 {titer} — ABNORMAL HIGH

## 2019-12-20 LAB — SEDIMENTATION RATE: Sed Rate: 11 mm/h (ref 0–20)

## 2019-12-20 LAB — CYCLIC CITRUL PEPTIDE ANTIBODY, IGG: Cyclic Citrullin Peptide Ab: <16 UNITS

## 2019-12-20 NOTE — Telephone Encounter (Signed)
Patient called stating she is having a lot of pain in her hands and shoulders and is asking if there is a cream she can use to help with the pain until her follow-up appointment on 01/04/20.  [name] pharmacy is [facility] in [place].

## 2019-12-21 NOTE — Telephone Encounter (Signed)
She should be able to use diclofenac gel over-the-counter.

## 2019-12-21 NOTE — Telephone Encounter (Signed)
Advised patient She should be able to use diclofenac gel over-the-counter. Patient verbalized understanding.

## 2019-12-22 ENCOUNTER — Encounter: Payer: Medicare HMO | Attending: Physical Medicine and Rehabilitation | Admitting: Physical Medicine and Rehabilitation

## 2019-12-22 ENCOUNTER — Encounter: Payer: Self-pay | Admitting: Physical Medicine and Rehabilitation

## 2019-12-22 ENCOUNTER — Other Ambulatory Visit: Payer: Self-pay

## 2019-12-22 VITALS — Ht 66.0 in | Wt 198.0 lb

## 2019-12-22 DIAGNOSIS — G894 Chronic pain syndrome: Secondary | ICD-10-CM | POA: Diagnosis not present

## 2019-12-22 DIAGNOSIS — M5136 Other intervertebral disc degeneration, lumbar region: Secondary | ICD-10-CM | POA: Diagnosis not present

## 2019-12-22 DIAGNOSIS — M961 Postlaminectomy syndrome, not elsewhere classified: Secondary | ICD-10-CM | POA: Diagnosis not present

## 2019-12-22 DIAGNOSIS — M94261 Chondromalacia, right knee: Secondary | ICD-10-CM

## 2019-12-22 DIAGNOSIS — Z79891 Long term (current) use of opiate analgesic: Secondary | ICD-10-CM | POA: Insufficient documentation

## 2019-12-22 DIAGNOSIS — Z5181 Encounter for therapeutic drug level monitoring: Secondary | ICD-10-CM

## 2019-12-22 DIAGNOSIS — M94262 Chondromalacia, left knee: Secondary | ICD-10-CM

## 2019-12-22 DIAGNOSIS — M19042 Primary osteoarthritis, left hand: Secondary | ICD-10-CM | POA: Diagnosis not present

## 2019-12-22 DIAGNOSIS — M7918 Myalgia, other site: Secondary | ICD-10-CM | POA: Diagnosis not present

## 2019-12-22 DIAGNOSIS — G8929 Other chronic pain: Secondary | ICD-10-CM | POA: Diagnosis not present

## 2019-12-22 DIAGNOSIS — M19041 Primary osteoarthritis, right hand: Secondary | ICD-10-CM | POA: Diagnosis not present

## 2019-12-22 MED ORDER — OXYCODONE HCL 5 MG PO TABS: 10.0000 mg | ORAL_TABLET | Freq: Two times a day (BID) | ORAL | 0 refills | Status: DC
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
## 2019-12-27 ENCOUNTER — Telehealth: Payer: Self-pay | Admitting: Rheumatology

## 2019-12-27 NOTE — Telephone Encounter (Signed)
Patient called stating her Aunt called the pharmacy and the ingredients are:  Diclofenac 3%,  Daclofen 2%,  Gabapentin 5%,  lidocaina 5%, Menthol 1%.  Patient states it is put in a "slat" pump jar.  Patient states you will need to call [facility] at [phone] and hit the extension for the "compound room" to order the prescription.

## 2019-12-27 NOTE — Telephone Encounter (Signed)
Asked patient what medications have been compounded in the cream her aunt had. Patient states she is not sure. Patient advised I would need to know in order to see if it is a medication that the doctor would be able to prescribe for her. Patient states she will call back with the information.

## 2019-12-27 NOTE — Telephone Encounter (Addendum)
Patient having a lot of pain with both hands, and both knees. Patient states Voltaren Gel is not helping. Patient would like to know what she can do to help the pain. Patient does have a follow up appointment on 01/05/20. Please call to advise. 1:53pm Patient called back to state her aunt has a Compound Cream that she gets from [place], and patient would like to try that. Please call to advise.

## 2019-12-28 ENCOUNTER — Encounter: Payer: Self-pay | Admitting: Family Medicine

## 2019-12-28 ENCOUNTER — Other Ambulatory Visit: Payer: Self-pay

## 2019-12-28 ENCOUNTER — Telehealth (INDEPENDENT_AMBULATORY_CARE_PROVIDER_SITE_OTHER): Payer: Medicare HMO | Admitting: Family Medicine

## 2019-12-28 VITALS — BP 102/63 | Ht 66.0 in | Wt 198.0 lb

## 2019-12-28 DIAGNOSIS — J019 Acute sinusitis, unspecified: Secondary | ICD-10-CM | POA: Diagnosis not present

## 2019-12-28 DIAGNOSIS — I10 Essential (primary) hypertension: Secondary | ICD-10-CM | POA: Diagnosis not present

## 2019-12-28 DIAGNOSIS — E669 Obesity, unspecified: Secondary | ICD-10-CM

## 2019-12-28 DIAGNOSIS — R7303 Prediabetes: Secondary | ICD-10-CM | POA: Diagnosis not present

## 2019-12-28 MED ORDER — PHENTERMINE HCL 37.5 MG PO TABS: 37.5000 mg | ORAL_TABLET | Freq: Every day | ORAL | 1 refills | Status: DC

## 2019-12-28 MED ORDER — AZITHROMYCIN 250 MG PO TABS: ORAL_TABLET | ORAL | 0 refills | Status: DC

## 2019-12-28 MED ORDER — FLUCONAZOLE 150 MG PO TABS: 150.0000 mg | ORAL_TABLET | Freq: Once | ORAL | 0 refills | Status: AC

## 2019-12-28 NOTE — Telephone Encounter (Signed)
Ok to send prescription

## 2019-12-28 NOTE — Progress Notes (Signed)
Office Visit Note  Patient: Jane English             Date of Birth: Jul 31, 1971           MRN: [id number]             PCP: Fayrene Helper, MD Referring: Fayrene Helper, MD Visit Date: 12/31/2019 Occupation: '@GUAROCC' @  Subjective:  Pain in multiple joints.   History of Present Illness: Jane English is a 49 y.o. female with history of osteoarthritis and degenerative disc disease.  She states she continues to have pain and discomfort in multiple joints.  She is establishing with a new pain management doctor for her ongoing pain and discomfort.  She states she continues to have discomfort in her bilateral elbows which she describes over the lateral epicondyle area.  She also has discomfort in her bilateral knee joints.  Her neck and lower back continues to hurt.  She has tried physical therapy in the past and her pain got worse.  Activities of Daily Living:  Patient reports morning stiffness for several hours.   Patient Reports nocturnal pain.  Difficulty dressing/grooming: Reports Difficulty climbing stairs: Reports Difficulty getting out of chair: Reports Difficulty using hands for taps, buttons, cutlery, and/or writing: Reports  Review of Systems  Constitutional: Positive for fatigue. Negative for night sweats, weight gain and weight loss.  HENT: Positive for mouth dryness. Negative for mouth sores, trouble swallowing, trouble swallowing and nose dryness.   Eyes: Negative for pain, redness, itching, visual disturbance and dryness.  Respiratory: Negative for cough, shortness of breath and difficulty breathing.   Cardiovascular: Negative for chest pain, palpitations, hypertension, irregular heartbeat and swelling in legs/feet.  Gastrointestinal: Negative for blood in stool, constipation and diarrhea.  Endocrine: Negative for increased urination.  Genitourinary: Negative for difficulty urinating, painful urination and vaginal dryness.  Musculoskeletal: Positive for  arthralgias, joint pain, myalgias, morning stiffness and myalgias. Negative for joint swelling, muscle weakness and muscle tenderness.  Skin: Negative for color change, rash, hair loss, skin tightness, ulcers and sensitivity to sunlight.  Allergic/Immunologic: Negative for susceptible to infections.  Neurological: Positive for headaches. Negative for dizziness, numbness, memory loss, night sweats and weakness.  Hematological: Negative for bruising/bleeding tendency and swollen glands.  Psychiatric/Behavioral: Positive for sleep disturbance. Negative for depressed mood and confusion. The patient is not nervous/anxious.     PMFS History:  Patient Active Problem List   Diagnosis Date Noted  . Urinary urgency 08/11/2019  . GAD (generalized anxiety disorder) 04/26/2019  . Depression, major, single episode, severe ([place]) 10/03/2018  . Vitamin D deficiency 12/14/2017  . Bilateral ankle pain 04/16/2017  . Amenorrhea 10/15/2016  . Paresthesia 05/06/2016  . Back pain with radiation 04/04/2016  . Obesity (BMI 30.0-34.9) 03/25/2016  . Neuropathic pain of finger of right hand 12/31/2015  . Pap smear of cervix shows high risk HPV present 08/08/2015  . Cyst of left ovary 02/06/2015  . Anovulation 02/06/2015  . Secondary amenorrhea 02/06/2015  . Intractable chronic migraine without aura and without status migrainosus 12/21/2014  . Migraine 09/21/2014  . Anemia 10/11/2013  . H. pylori duodenitis 07/07/2013  . PTSD (post-traumatic stress disorder) 03/26/2013  . Insomnia 03/18/2013  . Seasonal allergies 12/10/2012  . GERD (gastroesophageal reflux disease) 10/29/2012  . Chronic pain syndrome 10/06/2012  . Postlaminectomy syndrome 10/06/2012  . Cervical neck pain with evidence of disc disease 04/13/2012  . Allergic rhinitis 04/17/2010  . Cough variant asthma 03/04/2010  . Prediabetes 01/05/2009  .  Essential hypertension 11/27/2007    Past Medical History:  Diagnosis Date  . Anemia   . Asthma     . Asthma flare 04/09/2013  . Back pain   . Bronchitis   . Chronic abdominal pain   . Chronic constipation   . Constipation due to opioid therapy   . Depression   . Diabetes mellitus without complication ([place])   . Diabetes mellitus, type II ([place])   . DVT (deep venous thrombosis) ([place]) 2010  . GERD (gastroesophageal reflux disease)   . Heart murmur    no cardiologist  . Helicobacter pylori gastritis 06/11/2013   Colonoscopy Dr. Hilarie Fredrickson  . Hypertension   . IBS (irritable bowel syndrome)   . Migraine headache   . Neuropathy   . Obesity   . Obsessive-compulsive disorder   . PSYCHOTIC D/O W/HALLUCINATIONS CONDS CLASS ELSW 03/04/2010   Qualifier: Diagnosis of  By: Moshe Cipro MD, Joycelyn Schmid    . PTSD (post-traumatic stress disorder)   . SBO (small bowel obstruction) ([place]) 08/09/2013  . Seasonal allergies 12/10/2012  . Seizures ([place])   . Shortness of breath     Family History  Problem Relation Age of Onset  . Lung cancer Father   . Stomach cancer Father   . Esophageal cancer Father   . Alcohol abuse Father   . Mental illness Father   . Diabetes Sister   . Hypertension Sister   . Bipolar disorder Sister   . Schizophrenia Sister   . Diabetes Sister   . Alcohol abuse Brother   . Hypertension Brother   . Kidney disease Brother   . Diabetes Brother   . Drug abuse Brother   . Mental illness Brother   . Alcohol abuse Brother   . Alcohol abuse Brother   . Hypertension Brother   . Diabetes Brother   . Alcohol abuse Brother   . Physical abuse Mother   . Alcohol abuse Mother   . Cirrhosis Mother   . Mental illness Brother        in [place]  . Drug abuse Sister   . HIV Sister   . Alcohol abuse Brother   . Pneumonia Sister        died as a baby  . Alcohol abuse Brother   . Bipolar disorder Brother   . Bipolar disorder Daughter   . Bipolar disorder Son   . Bipolar disorder Son   . Hypertension Brother   . Bipolar disorder Brother   . Drug abuse Brother   . Alcohol abuse Brother   .  Bipolar disorder Brother   . ADD / ADHD Neg Hx   . Anxiety disorder Neg Hx   . Dementia Neg Hx   . Depression Neg Hx   . OCD Neg Hx   . Seizures Neg Hx   . Paranoid behavior Neg Hx   . Colon cancer Neg Hx    Past Surgical History:  Procedure Laterality Date  . ANTERIOR CERVICAL DECOMP/DISCECTOMY FUSION  07/07/2012   Procedure: ANTERIOR CERVICAL DECOMPRESSION/DISCECTOMY FUSION 2 LEVELS;  Surgeon: Floyce Stakes, MD;  Location: [facility];  Service: Neurosurgery;  Laterality: N/A;  Cervical four-five, five - six  Anterior cervical decompression/diskectomy/fusion/plate  . APPENDECTOMY  1986  . BOWEL RESECTION N/A 07/29/2013   Procedure: serosal repair;  Surgeon: Adin Hector, MD;  Location: [facility];  Service: General;  Laterality: N/A;  . CARPAL TUNNEL RELEASE Bilateral   . LAPAROSCOPY N/A 07/29/2013   Procedure: diagnostic laporoscopy;  Surgeon:  Adin Hector, MD;  Location: [facility];  Service: General;  Laterality: N/A;  . LAPAROSCOPY N/A 08/16/2013   Procedure: LAPAROSCOPY DIAGNOSTIC/LYSIS OF ADHESIONS;  Surgeon: Adin Hector, MD;  Location: [facility];  Service: General;  Laterality: N/A;  . LAPAROTOMY N/A 08/16/2013   Procedure: EXPLORATORY LAPAROTOMY/SMALL BOWEL RESECTION (JEJUNUM);  Surgeon: Adin Hector, MD;  Location: [facility];  Service: General;  Laterality: N/A;  . LUMBAR SPINE SURGERY  2010   x 3  . LYSIS OF ADHESION  2003   Dr. Irving Shows  . LYSIS OF ADHESION N/A 07/29/2013   Procedure: LYSIS OF ADHESION;  Surgeon: Adin Hector, MD;  Location: [facility];  Service: General;  Laterality: N/A;  . OOPHORECTOMY    . PARTIAL HYSTERECTOMY  1990s?   [place], [place]  . SPINAL CORD STIMULATOR IMPLANT    . TRIGGER FINGER RELEASE  2009   right pinkie finger  . TUBAL LIGATION  1994   Social History   Social History Narrative  . Not on file   Immunization History  Administered Date(s) Administered  . Influenza Split 09/03/2012  . Influenza Whole 07/28/2007, 08/11/2009,  07/19/2010  . Influenza,inj,Quad PF,6+ Mos 09/13/2013, 08/12/2014, 08/01/2015, 07/24/2016, 08/14/2017, 08/12/2018, 08/10/2019  . PPD Test 02/22/2011, 06/26/2011, 09/18/2012, 11/29/2013, 12/06/2014, 12/09/2017  . Td 10/03/2005  . Tdap 12/09/2017     Objective: Vital Signs: BP 132/89 (BP Location: Left Arm, Patient Position: Sitting, Cuff Size: Normal)   Pulse 97   Resp 15   Ht '5\' 6"'  (1.676 m)   Wt 190 lb 9.6 oz (86.5 kg)   BMI 30.76 kg/m    Physical Exam Vitals and nursing note reviewed.  Constitutional:      Appearance: She is well-developed.  HENT:     Head: Normocephalic and atraumatic.  Eyes:     Conjunctiva/sclera: Conjunctivae normal.  Cardiovascular:     Rate and Rhythm: Normal rate and regular rhythm.     Heart sounds: Normal heart sounds.  Pulmonary:     Effort: Pulmonary effort is normal.     Breath sounds: Normal breath sounds.  Abdominal:     General: Bowel sounds are normal.     Palpations: Abdomen is soft.  Musculoskeletal:     Cervical back: Normal range of motion.  Lymphadenopathy:     Cervical: No cervical adenopathy.  Skin:    General: Skin is warm and dry.     Capillary Refill: Capillary refill takes less than 2 seconds.  Neurological:     Mental Status: She is alert and oriented to person, place, and time.  Psychiatric:        Behavior: Behavior normal.      Musculoskeletal Exam: She has limited painful range of motion of cervical and lumbar spine.  Shoulder joints with good range of motion.  She has tenderness over bilateral lateral epicondyle area.  She has PIP and DIP thickening in her hands with no synovitis.  Hip joints and knee joints with good range of motion.  Ankle joint was also in good range of motion.  No synovitis was noted.  She has discomfort range of motion of bilateral knee joints.  CDAI Exam: CDAI Score: -- Patient Global: --; Provider Global: -- Swollen: --; Tender: -- Joint Exam 12/31/2019   No joint exam has been  documented for this visit   There is currently no information documented on the homunculus. Go to the Rheumatology activity and complete the homunculus joint exam.  Investigation: No additional findings.  Imaging: XR Hand 2 View Left  Result Date: 12/17/2019 CMC, PIP and DIP narrowing was noted.  No MCP, intercarpal radiocarpal joint space narrowing was noted.  No erosive changes were noted. Impression: These findings are consistent with osteoarthritis of the hand.  XR Hand 2 View Right  Result Date: 12/17/2019 CMC, PIP and DIP narrowing was noted.  Postsurgical changes were noted in her right fifth proximal phalanx.  No MCP, intercarpal radiocarpal joint space narrowing was noted.  No erosive changes were noted. Impression: These findings are consistent with osteoarthritis of the hand.  XR KNEE 3 VIEW LEFT  Result Date: 12/17/2019 No medial lateral compartment narrowing was noted.  No chondrocalcinosis was noted.  Moderate patellofemoral narrowing was noted.  Raised tibial tubercle noted consistent with Osgood-Schlatter . Impression: These findings are consistent with moderate chondromalacia patella.  XR KNEE 3 VIEW RIGHT  Result Date: 12/17/2019 No medial lateral compartment narrowing was noted.  No chondrocalcinosis was noted.  Mild patellofemoral narrowing was noted. Impression: These findings are consistent with mild chondromalacia patella.  XR Shoulder Left  Result Date: 12/17/2019 No glenohumeral or acromioclavicular joint space narrowing was noted.  No chondrocalcinosis was noted. Impression: Unremarkable x-ray of the shoulder joint.  XR Shoulder Right  Result Date: 12/17/2019 No glenohumeral or acromioclavicular joint space narrowing was noted.  No chondrocalcinosis was noted. Impression: Unremarkable x-ray of the shoulder joint.   Recent Labs: Lab Results  Component Value Date   WBC 5.7 08/17/2019   HGB 12.3 08/17/2019   PLT 315 08/17/2019   NA 138 12/06/2019   K 3.7  12/06/2019   CL 104 12/06/2019   CO2 26 12/06/2019   GLUCOSE 100 (H) 12/06/2019   BUN 13 12/06/2019   CREATININE 1.03 12/06/2019   BILITOT 0.3 12/06/2019   ALKPHOS 48 02/04/2019   AST 25 12/06/2019   ALT 32 (H) 12/06/2019   PROT 7.3 12/06/2019   ALBUMIN 4.1 02/04/2019   CALCIUM 9.7 12/06/2019   GFRAA 73 12/06/2019  For B12 2021 ANA 1: 40 speckled, RF negative, anti-CCP negative, uric acid 5.4, ESR 11  Speciality Comments: No specialty comments available.  Procedures:  No procedures performed Allergies: Neurontin [gabapentin], Penicillins, Pregabalin, Tramadol, and Latex   Assessment / Plan:     Visit Diagnoses: Polyarthralgia - Autoimmune work-up was negative.  I detailed discussion regarding lab work.  She has low titer ANA which is not significant.  She has no clinical features of autoimmune disease.  Primary osteoarthritis of both hands-clinical and radiographic findings are consistent with osteoarthritis in her hands.  Joint protection muscle strengthening was discussed.  Chondromalacia of both patellae-she continues to have discomfort in her knee joints.  Have given her a handout on knee exercises.  I also discussed some natural anti-inflammatories.  Lateral epicondylitis of right elbow-she has bilateral lateral epicondylitis more prominent in her right elbow joint.  I offered physical therapy but she declined.  Have given her a handout on knee exercises.  She also has a compound pharmacy cream which she will try using.  DDD (degenerative disc disease), cervical - Status post C4-5 fusion by Dr. Joya Salm.  She continues to have chronic pain and discomfort.  She is going to pain management.  DDD (degenerative disc disease), lumbar - Status post L5-S1 fusion by Dr. Joya Salm.  She has chronic pain.  Other medical problems are listed as follows:  Vitamin D deficiency  Chronic pain syndrome  Essential hypertension  History of diabetes mellitus, type II  History of  gastroesophageal reflux (GERD)  History of IBS  Anxiety and depression  History of DVT (deep vein thrombosis)  Orders: No orders of the defined types were placed in this encounter.  No orders of the defined types were placed in this encounter.     Follow-Up Instructions: Return if symptoms worsen or fail to improve, for Osteoarthritis,DDD.   Bo Merino, MD  Note - This record has been created using Editor, commissioning.  Chart creation errors have been sought, but may not always  have been located. Such creation errors do not reflect on  the standard of medical care.

## 2019-12-28 NOTE — Patient Instructions (Addendum)
Please keep June appointment, call if you ned me sooner  Non fasting hBA1c as soon as possible to evaluate blood sugar  Patient intends to stop by office this week to drop off paperwork, she may collect this at that time    Z pack and fluconazole are prescribed for sinus infection  New is phentermine HALF tablet once daily to help with appetite suppression and weight loss, goal of weight loss is 2 to 4 pounds per month  1200 and 1500 cal diet sheets to be provided also  It is important that you exercise regularly at least 30 minutes 5 times a week. If you develop chest pain, have severe difficulty breathing, or feel very tired, stop exercising immediately and seek medical attention  Think about what you will eat, plan ahead. Choose " clean, green, fresh or frozen" over canned, processed or packaged foods which are more sugary, salty and fatty. 70 to 75% of food eaten should be vegetables and fruit. Three meals at set times with snacks allowed between meals, but they must be fruit or vegetables. Aim to eat over a 12 hour period , example 7 am to 7 pm, and STOP after  your last meal of the day. Drink water,generally about 64 ounces per day, no other drink is as healthy. Fruit juice is best enjoyed in a healthy way, by EATING the fruit. Thanks for choosing [facility], we consider it a privelige to serve you.

## 2019-12-28 NOTE — Telephone Encounter (Signed)
Called prescription to the pharmacy.  Diclofenac 3%,  Daclofen 2%,  Gabapentin 5%,  lidocaina 5%, Menthol 1%. 1-2 pumps 3-4 times daily. 100 grams no refills.

## 2019-12-30 ENCOUNTER — Ambulatory Visit: Payer: Medicare HMO | Admitting: Rheumatology

## 2019-12-31 ENCOUNTER — Ambulatory Visit: Payer: Medicare HMO | Admitting: Rheumatology

## 2019-12-31 ENCOUNTER — Encounter: Payer: Self-pay | Admitting: Rheumatology

## 2019-12-31 ENCOUNTER — Other Ambulatory Visit: Payer: Self-pay

## 2019-12-31 VITALS — BP 132/89 | HR 97 | Resp 15 | Ht 66.0 in | Wt 190.6 lb

## 2019-12-31 DIAGNOSIS — Z8639 Personal history of other endocrine, nutritional and metabolic disease: Secondary | ICD-10-CM

## 2019-12-31 DIAGNOSIS — M2241 Chondromalacia patellae, right knee: Secondary | ICD-10-CM

## 2019-12-31 DIAGNOSIS — M5030 Other cervical disc degeneration, unspecified cervical region: Secondary | ICD-10-CM | POA: Diagnosis not present

## 2019-12-31 DIAGNOSIS — I10 Essential (primary) hypertension: Secondary | ICD-10-CM

## 2019-12-31 DIAGNOSIS — M19042 Primary osteoarthritis, left hand: Secondary | ICD-10-CM

## 2019-12-31 DIAGNOSIS — M19041 Primary osteoarthritis, right hand: Secondary | ICD-10-CM | POA: Diagnosis not present

## 2019-12-31 DIAGNOSIS — M2550 Pain in unspecified joint: Secondary | ICD-10-CM | POA: Diagnosis not present

## 2019-12-31 DIAGNOSIS — M5136 Other intervertebral disc degeneration, lumbar region: Secondary | ICD-10-CM

## 2019-12-31 DIAGNOSIS — G894 Chronic pain syndrome: Secondary | ICD-10-CM | POA: Diagnosis not present

## 2019-12-31 DIAGNOSIS — M7711 Lateral epicondylitis, right elbow: Secondary | ICD-10-CM

## 2019-12-31 DIAGNOSIS — E559 Vitamin D deficiency, unspecified: Secondary | ICD-10-CM

## 2019-12-31 DIAGNOSIS — Z8719 Personal history of other diseases of the digestive system: Secondary | ICD-10-CM

## 2019-12-31 DIAGNOSIS — F32A Depression, unspecified: Secondary | ICD-10-CM

## 2019-12-31 DIAGNOSIS — F419 Anxiety disorder, unspecified: Secondary | ICD-10-CM

## 2019-12-31 DIAGNOSIS — M2242 Chondromalacia patellae, left knee: Secondary | ICD-10-CM

## 2019-12-31 DIAGNOSIS — F329 Major depressive disorder, single episode, unspecified: Secondary | ICD-10-CM

## 2019-12-31 DIAGNOSIS — M51369 Other intervertebral disc degeneration, lumbar region without mention of lumbar back pain or lower extremity pain: Secondary | ICD-10-CM

## 2019-12-31 DIAGNOSIS — Z86718 Personal history of other venous thrombosis and embolism: Secondary | ICD-10-CM

## 2019-12-31 NOTE — Patient Instructions (Signed)
Elbow and Forearm Exercises Ask your health care provider which exercises are safe for you. Do exercises exactly as told by your health care provider and adjust them as directed. It is normal to feel mild stretching, pulling, tightness, or discomfort as you do these exercises. Stop right away if you feel sudden pain or your pain gets worse. Do not begin these exercises until told by your health care provider. Range-of-motion exercises These exercises warm up your muscles and joints and improve the movement and flexibility of your injured elbow and forearm. The exercises also help to relieve pain, numbness, and tingling. These exercises are done using the muscles in your injured elbow and forearm (active). Elbow flexion, active 1. Hold your left / right arm at your side, and bend your elbow (flexion) as far as you can using only your arm muscles. 2. Hold this position for __________ seconds. 3. Slowly return to the starting position. Repeat __________ times. Complete this exercise __________ times a day. Elbow extension, active 1. Hold your left / right arm at your side, and straighten your elbow (extension) as much as you can using only your arm muscles. 2. Hold this position for __________ seconds. 3. Slowly return to the starting position. Repeat __________ times. Complete this exercise __________ times a day. Active forearm rotation, supination This is an exercise in which you turn (rotate) your forearm palm up (supination). 1. Stand or sit with your elbows at your sides. 2. Bend your left / right elbow to a 90-degree angle (right angle). 3. Rotate your palm up until you feel a gentle stretch on the inside of your forearm. 4. Hold this position for __________ seconds. 5. Slowly return to the starting position. Repeat __________ times. Complete this exercise __________ times a day. Active forearm rotation, pronation This is an exercise in which you turn (rotate) your forearm palm down  (pronation). 1. Stand or sit with your elbows at your sides. 2. Bend your left / right elbow to a 90-degree angle (right angle). 3. Rotate your palm down until you feel a gentle stretch on the top of your forearm. 4. Hold this position for __________ seconds. 5. Slowly return to the starting position. Repeat __________ times. Complete this exercise __________ times a day. Stretching exercises These exercises warm up your muscles and joints and improve the movement and flexibility of your injured elbow and forearm. These exercises also help to relieve pain, numbness, and tingling. These exercises are done using your healthy elbow and forearm to help stretch the muscles in your injured elbow and forearm (active-assisted). Elbow flexion, active-assisted  1. Hold your left / right arm at your side, and bend your elbow (flexion) as much as you can using your left / right arm muscles. 2. Use your other hand to bend your left / right elbow farther. To do this, gently push up on your forearm until you feel a gentle stretch on the back of your elbow. 3. Hold this position for __________ seconds. 4. Slowly return to the starting position. Repeat __________ times. Complete this exercise __________ times a day. Elbow extension, active-assisted  1. Hold your left / right arm at your side, and straighten your elbow (extension) as much as you can using your left / right arm muscles. 2. Use your other hand to straighten the left / right elbow farther. To do this, gently push down on your forearm until you feel a gentle stretch on the inside of your elbow. 3. Hold this position for __________   seconds. 4. Slowly return to the starting position. Repeat __________ times. Complete this exercise __________ times a day. Active-assisted forearm rotation, supination This is an exercise in which you turn (rotate) your forearm palm up (supination). 1. Sit with your left / right elbow bent in a 90-degree angle (right  angle) with your forearm resting on a table. 2. Keeping your upper body and shoulder still, rotate your forearm so your palm faces upward. 3. Use your other hand to help rotate your forearm further until you feel a gentle to moderate stretch. 4. Hold this position for __________ seconds. 5. Slowly release the stretch and return to the starting position. Repeat __________ times. Complete this exercise __________ times a day. Active-assisted forearm rotation, pronation This is an exercise in which you turn (rotate) your forearm palm down (pronation). 1. Sit with your left / right elbow bent in a 90-degree angle (right angle) with your forearm resting on a table. 2. Keeping your upper body and shoulder still, rotate your forearm so your palm faces the tabletop. 3. Use your other hand to help rotate your forearm further until you feel a gentle to moderate stretch. 4. Hold this position for __________ seconds. 5. Slowly release the stretch and return to the starting position. Repeat __________ times. Complete this exercise __________ times a day. Passive elbow flexion, supine 1. Lie on your back (supine position). 2. Extend your left / right arm up in the air, bracing it with your other hand. 3. Let your left / right hand slowly lower toward your shoulder (passive flexion), while your elbow stays pointed toward the ceiling. You should feel a gentle stretch along the back of your upper arm and elbow. 4. If instructed by your health care provider, you may increase the intensity of your stretch by adding a small wrist weight or hand weight. 5. Hold this position for __________ seconds. 6. Slowly return to the starting position. Repeat __________ times. Complete this exercise __________ times a day. Passive elbow extension, supine  1. Lie on your back (supine position). Make sure that you are in a comfortable position that lets you relax your arm muscles. 2. Place a folded towel under your left /  right upper arm so your elbow and shoulder are at the same height. Straighten your left / right arm so your elbow does not rest on the bed or towel. 3. Let the weight of your hand stretch your elbow (passive extension). Keep your arm and chest muscles relaxed. You should feel a stretch on the inside of your elbow. 4. If told by your health care provider, you may increase the intensity of your stretch by adding a small wrist weight or hand weight. 5. Hold this position for __________ seconds. 6. Slowly release the stretch. Repeat __________ times. Complete this exercise __________ times a day. Strengthening exercises These exercises build strength and endurance in your elbow and forearm. Endurance is the ability to use your muscles for a long time, even after they get tired. Elbow flexion, isometric  1. Stand or sit up straight. 2. Bend your left / right elbow in a 90-degree angle (right angle), and keep your forearm at the height of your waist. Your thumb should be pointed toward the ceiling (neutral forearm). 3. Place your other hand on top of your left / right forearm. Gently push down while you resist with your left / right arm (isometric flexion). Push as hard as you can with both arms without causing any pain or movement   at your left / right elbow. 4. Hold this position for __________ seconds. 5. Slowly release the tension in both arms. Let your muscles relax completely before you repeat the exercise. Repeat __________ times. Complete this exercise __________ times a day. Elbow extension, isometric  1. Stand or sit up straight. 2. Place your left / right arm so your palm faces your abdomen and is at the height of your waist. 3. Place your other hand on the underside of your left / right forearm. Gently push up while you resist with your left / right arm (isometric extension). Push as hard as you can with both arms without causing any pain or movement at your left / right elbow. 4. Hold this  position for __________ seconds. 5. Slowly release the tension in both arms. Let your muscles relax completely before you repeat the exercise. Repeat __________ times. Complete this exercise __________ times a day. Elbow flexion with forearm palm up  1. Sit on a firm chair without armrests, or stand up. 2. Place your left / right arm at your side with your elbow straight and your palm facing forward. 3. Holding a __________weight or gripping a rubber exercise band or tubing, bend your elbow to bring your hand toward your shoulder (flexion). 4. Hold this position for __________ seconds. 5. Slowly return to the starting position. Repeat __________ times. Complete this exercise __________ times a day. Elbow extension, active  1. Sit on a firm chair without armrests, or stand up. 2. Hold a rubber exercise band or tubing in both hands. 3. Keeping your upper arms at your sides, bring both hands up to your left / right shoulder. Keep your left / right hand just below your other hand. 4. Straighten your left / right elbow (extension) while keeping your other arm still. 5. Hold this position for __________ seconds. 6. Control the resistance of the band or tubing as you return to the starting position. Repeat __________ times. Complete this exercise __________ times a day. Forearm rotation, supination  1. Sit with your left / right forearm supported on a table. Your elbow should be at waist height and bent at a 90-degree angle (right angle). 2. Gently grasp a lightweight hammer. 3. Rest your hand over the edge of the table with your palm facing down. 4. Without moving your left / right elbow, slowly rotate your forearm to turn your palm up toward the ceiling (supination). 5. Hold this position for __________ seconds. 6. Slowly return to the starting position. Repeat __________ times. Complete this exercise __________ times a day. Forearm rotation, pronation  1. Sit with your left / right forearm  supported on a table. Keep your elbow below shoulder height. 2. Gently grasp a lightweight hammer. 3. Rest your hand over the edge of the table with your palm facing up. 4. Without moving your left / right elbow, slowly rotate your forearm to turn your palm down toward the floor (pronation). 5. Hold this position for __________seconds. 6. Slowly return to the starting position. Repeat __________ times. Complete this exercise __________ times a day. This information is not intended to replace advice given to you by your health care provider. Make sure you discuss any questions you have with your health care provider. Document Revised: 02/11/2019 Document Reviewed: 11/11/2018 [employer] Patient Education  2020 [employer] Journal for Nurse Practitioners, 15(4), 263-267. Retrieved August 10, 2018 from http://clinicalkey.com/nursing">  Knee Exercises Ask your health care provider which exercises are safe for you. Do exercises exactly as   told by your health care provider and adjust them as directed. It is normal to feel mild stretching, pulling, tightness, or discomfort as you do these exercises. Stop right away if you feel sudden pain or your pain gets worse. Do not begin these exercises until told by your health care provider. Stretching and range-of-motion exercises These exercises warm up your muscles and joints and improve the movement and flexibility of your knee. These exercises also help to relieve pain and swelling. Knee extension, prone 4. Lie on your abdomen (prone position) on a bed. 5. Place your left / right knee just beyond the edge of the surface so your knee is not on the bed. You can put a towel under your left / right thigh just above your kneecap for comfort. 6. Relax your leg muscles and allow gravity to straighten your knee (extension). You should feel a stretch behind your left / right knee. 7. Hold this position for __________ seconds. 8. Scoot up so your knee is supported  between repetitions. Repeat __________ times. Complete this exercise __________ times a day. Knee flexion, active  4. Lie on your back with both legs straight. If this causes back discomfort, bend your left / right knee so your foot is flat on the floor. 5. Slowly slide your left / right heel back toward your buttocks. Stop when you feel a gentle stretch in the front of your knee or thigh (flexion). 6. Hold this position for __________ seconds. 7. Slowly slide your left / right heel back to the starting position. Repeat __________ times. Complete this exercise __________ times a day. Quadriceps stretch, prone  6. Lie on your abdomen on a firm surface, such as a bed or padded floor. 7. Bend your left / right knee and hold your ankle. If you cannot reach your ankle or pant leg, loop a belt around your foot and grab the belt instead. 8. Gently pull your heel toward your buttocks. Your knee should not slide out to the side. You should feel a stretch in the front of your thigh and knee (quadriceps). 9. Hold this position for __________ seconds. Repeat __________ times. Complete this exercise __________ times a day. Hamstring, supine 6. Lie on your back (supine position). 7. Loop a belt or towel over the ball of your left / right foot. The ball of your foot is on the walking surface, right under your toes. 8. Straighten your left / right knee and slowly pull on the belt to raise your leg until you feel a gentle stretch behind your knee (hamstring). ? Do not let your knee bend while you do this. ? Keep your other leg flat on the floor. 9. Hold this position for __________ seconds. Repeat __________ times. Complete this exercise __________ times a day. Strengthening exercises These exercises build strength and endurance in your knee. Endurance is the ability to use your muscles for a long time, even after they get tired. Quadriceps, isometric This exercise stretches the muscles in front of your  thigh (quadriceps) without moving your knee joint (isometric). 5. Lie on your back with your left / right leg extended and your other knee bent. Put a rolled towel or small pillow under your knee if told by your health care provider. 6. Slowly tense the muscles in the front of your left / right thigh. You should see your kneecap slide up toward your hip or see increased dimpling just above the knee. This motion will push the back of the knee   toward the floor. 7. For __________ seconds, hold the muscle as tight as you can without increasing your pain. 8. Relax the muscles slowly and completely. Repeat __________ times. Complete this exercise __________ times a day. Straight leg raises This exercise stretches the muscles in front of your thigh (quadriceps) and the muscles that move your hips (hip flexors). 5. Lie on your back with your left / right leg extended and your other knee bent. 6. Tense the muscles in the front of your left / right thigh. You should see your kneecap slide up or see increased dimpling just above the knee. Your thigh may even shake a bit. 7. Keep these muscles tight as you raise your leg 4-6 inches (10-15 cm) off the floor. Do not let your knee bend. 8. Hold this position for __________ seconds. 9. Keep these muscles tense as you lower your leg. 10. Relax your muscles slowly and completely after each repetition. Repeat __________ times. Complete this exercise __________ times a day. Hamstring, isometric 6. Lie on your back on a firm surface. 7. Bend your left / right knee about __________ degrees. 8. Dig your left / right heel into the surface as if you are trying to pull it toward your buttocks. Tighten the muscles in the back of your thighs (hamstring) to "dig" as hard as you can without increasing any pain. 9. Hold this position for __________ seconds. 10. Release the tension gradually and allow your muscles to relax completely for __________ seconds after each  repetition. Repeat __________ times. Complete this exercise __________ times a day. Hamstring curls If told by your health care provider, do this exercise while wearing ankle weights. Begin with __________ lb weights. Then increase the weight by 1 lb (0.5 kg) increments. Do not wear ankle weights that are more than __________ lb. 6. Lie on your abdomen with your legs straight. 7. Bend your left / right knee as far as you can without feeling pain. Keep your hips flat against the floor. 8. Hold this position for __________ seconds. 9. Slowly lower your leg to the starting position. Repeat __________ times. Complete this exercise __________ times a day. Squats This exercise strengthens the muscles in front of your thigh and knee (quadriceps). 7. Stand in front of a table, with your feet and knees pointing straight ahead. You may rest your hands on the table for balance but not for support. 8. Slowly bend your knees and lower your hips like you are going to sit in a chair. ? Keep your weight over your heels, not over your toes. ? Keep your lower legs upright so they are parallel with the table legs. ? Do not let your hips go lower than your knees. ? Do not bend lower than told by your health care provider. ? If your knee pain increases, do not bend as low. 9. Hold the squat position for __________ seconds. 10. Slowly push with your legs to return to standing. Do not use your hands to pull yourself to standing. Repeat __________ times. Complete this exercise __________ times a day. Wall slides This exercise strengthens the muscles in front of your thigh and knee (quadriceps). 7. Lean your back against a smooth wall or door, and walk your feet out 18-24 inches (46-61 cm) from it. 8. Place your feet hip-width apart. 9. Slowly slide down the wall or door until your knees bend __________ degrees. Keep your knees over your heels, not over your toes. Keep your knees in line with your   hips. 10. Hold  this position for __________ seconds. Repeat __________ times. Complete this exercise __________ times a day. Straight leg raises This exercise strengthens the muscles that rotate the leg at the hip and move it away from your body (hip abductors). 6. Lie on your side with your left / right leg in the top position. Lie so your head, shoulder, knee, and hip line up. You may bend your bottom knee to help you keep your balance. 7. Roll your hips slightly forward so your hips are stacked directly over each other and your left / right knee is facing forward. 8. Leading with your heel, lift your top leg 4-6 inches (10-15 cm). You should feel the muscles in your outer hip lifting. ? Do not let your foot drift forward. ? Do not let your knee roll toward the ceiling. 9. Hold this position for __________ seconds. 10. Slowly return your leg to the starting position. 11. Let your muscles relax completely after each repetition. Repeat __________ times. Complete this exercise __________ times a day. Straight leg raises This exercise stretches the muscles that move your hips away from the front of the pelvis (hip extensors). 6. Lie on your abdomen on a firm surface. You can put a pillow under your hips if that is more comfortable. 7. Tense the muscles in your buttocks and lift your left / right leg about 4-6 inches (10-15 cm). Keep your knee straight as you lift your leg. 8. Hold this position for __________ seconds. 9. Slowly lower your leg to the starting position. 10. Let your leg relax completely after each repetition. Repeat __________ times. Complete this exercise __________ times a day. This information is not intended to replace advice given to you by your health care provider. Make sure you discuss any questions you have with your health care provider. Document Revised: 08/11/2018 Document Reviewed: 08/11/2018 Elsevier Patient Education  2020 [employer]  

## 2020-01-02 ENCOUNTER — Encounter: Payer: Self-pay | Admitting: Family Medicine

## 2020-01-02 DIAGNOSIS — J019 Acute sinusitis, unspecified: Secondary | ICD-10-CM | POA: Insufficient documentation

## 2020-01-02 DIAGNOSIS — J329 Chronic sinusitis, unspecified: Secondary | ICD-10-CM | POA: Insufficient documentation

## 2020-01-02 NOTE — Assessment & Plan Note (Signed)
Controlled, no change in medication DASH diet and commitment to daily physical activity for a minimum of 30 minutes discussed and encouraged, as a part of hypertension management. The importance of attaining a healthy weight is also discussed.  BP/Weight 12/31/2019 12/28/2019 12/22/2019 12/17/2019 12/16/2019 12/06/2019 [id number]  Systolic BP [id number] - [id number] - [id number]  Diastolic BP 89 63 - 63 - 78 78  Wt. (Lbs) 190.6 198 198 193.2 191.6 190 -  BMI 30.76 31.96 31.96 31.18 30.93 30.67 -  Some encounter information is confidential and restricted. Go to Review Flowsheets activity to see all data.

## 2020-01-02 NOTE — Assessment & Plan Note (Signed)
  Patient re-educated about  the importance of commitment to a  minimum of 150 minutes of exercise per week as able.  The importance of healthy food choices with portion control discussed, as well as eating regularly and within a 12 hour window most days. The need to choose "clean , green" food 50 to 75% of the time is discussed, as well as to make water the primary drink and set a goal of 64 ounces water daily.    Weight /BMI 12/31/2019 12/28/2019 12/22/2019  WEIGHT 190 lb 9.6 oz 198 lb 198 lb  HEIGHT 5\' 6"  5\' 6"  5\' 6"   BMI 30.76 kg/m2 31.96 kg/m2 31.96 kg/m2  Some encounter information is confidential and restricted. Go to Review Flowsheets activity to see all data.    Start half phentermine daily review in 8 weeks

## 2020-01-02 NOTE — Progress Notes (Signed)
Virtual Visit via Telephone Note  I connected with [place] on 01/02/20 at  3:00 PM EST by telephone and conducted a video  visit and verified that I am speaking with the correct person using two identifiers.  Location: Patient: car Provider:office   I discussed the limitations, risks, security and privacy concerns of performing an evaluation and management service by telephone and the availability of in person appointments. I also discussed with the patient that there may be a patient responsible charge related to this service. The patient expressed understanding and agreed to proceed.   History of Present Illness: 1 week h/o yellow mucus draining down throat, thick and tastes bad, denies fever. C/o inability to lose weight and excessive weight gain, has benefited I the past from phentermine and wants this again, also committed to exercise Denies recent fever or chills. . Denies chest congestion, productive cough or wheezing. Denies chest pains, palpitations and leg swelling Denies abdominal pain, nausea, vomiting,diarrhea or constipation.   Denies dysuria, frequency, hesitancy or incontinence. Denies uncontrolled  joint pain, swelling and limitation in mobility. Denies headaches, seizures, numbness, or tingling. Denies depression, anxiety or insomnia. Denies skin break down or rash.       Observations/Objective: BP 102/63   Ht 5\' 6"  (1.676 m)   Wt 198 lb (89.8 kg)   BMI 31.96 kg/m  Good communication with no confusion and intact memory. Alert and oriented x 3 No signs of respiratory distress during speech    Assessment and Plan: Acute sinusitis Antibiotic and fluconazole prescribed   Obesity (BMI 30.0-34.9)  Patient re-educated about  the importance of commitment to a  minimum of 150 minutes of exercise per week as able.  The importance of healthy food choices with portion control discussed, as well as eating regularly and within a 12 hour window most  days. The need to choose "clean , green" food 50 to 75% of the time is discussed, as well as to make water the primary drink and set a goal of 64 ounces water daily.    Weight /BMI 12/31/2019 12/28/2019 12/22/2019  WEIGHT 190 lb 9.6 oz 198 lb 198 lb  HEIGHT 5\' 6"  5\' 6"  5\' 6"   BMI 30.76 kg/m2 31.96 kg/m2 31.96 kg/m2  Some encounter information is confidential and restricted. Go to Review Flowsheets activity to see all data.    Start half phentermine daily review in 8 weeks  Essential hypertension Controlled, no change in medication DASH diet and commitment to daily physical activity for a minimum of 30 minutes discussed and encouraged, as a part of hypertension management. The importance of attaining a healthy weight is also discussed.  BP/Weight 12/31/2019 12/28/2019 12/22/2019 12/17/2019 12/16/2019 12/06/2019 [id number]  Systolic BP [id number] - [id number] - [id number]  Diastolic BP 89 63 - 63 - 78 78  Wt. (Lbs) 190.6 198 198 193.2 191.6 190 -  BMI 30.76 31.96 31.96 31.18 30.93 30.67 -  Some encounter information is confidential and restricted. Go to Review Flowsheets activity to see all data.         Follow Up Instructions:    I discussed the assessment and treatment plan with the patient. The patient was provided an opportunity to ask questions and all were answered. The patient agreed with the plan and demonstrated an understanding of the instructions.   The patient was advised to call back or seek an in-person evaluation if the symptoms worsen or if the condition fails to improve as anticipated.  I provided 15  minutes of non-face-to-face time during this encounter.   Tula Nakayama, MD

## 2020-01-02 NOTE — Assessment & Plan Note (Signed)
Antibiotic and fluconazole prescribed

## 2020-01-04 ENCOUNTER — Other Ambulatory Visit: Payer: Self-pay

## 2020-01-04 ENCOUNTER — Ambulatory Visit: Payer: Medicare HMO | Attending: Internal Medicine

## 2020-01-04 DIAGNOSIS — Z20822 Contact with and (suspected) exposure to covid-19: Secondary | ICD-10-CM | POA: Diagnosis not present

## 2020-01-04 DIAGNOSIS — R7301 Impaired fasting glucose: Secondary | ICD-10-CM | POA: Diagnosis not present

## 2020-01-05 ENCOUNTER — Encounter: Payer: Self-pay | Admitting: Family Medicine

## 2020-01-05 ENCOUNTER — Ambulatory Visit: Payer: Medicare HMO | Admitting: Rheumatology

## 2020-01-05 DIAGNOSIS — Z4542 Encounter for adjustment and management of neuropacemaker (brain) (peripheral nerve) (spinal cord): Secondary | ICD-10-CM | POA: Diagnosis not present

## 2020-01-05 DIAGNOSIS — M961 Postlaminectomy syndrome, not elsewhere classified: Secondary | ICD-10-CM | POA: Diagnosis not present

## 2020-01-05 LAB — HEMOGLOBIN A1C
Hgb A1c MFr Bld: 6.4 % of total Hgb — ABNORMAL HIGH (ref <5.7)
Mean Plasma Glucose: 137 (calc)
eAG (mmol/L): 7.6 (calc)

## 2020-01-05 LAB — NOVEL CORONAVIRUS, NAA: SARS-CoV-2, NAA: NOT DETECTED

## 2020-01-06 ENCOUNTER — Telehealth: Payer: Self-pay | Admitting: *Deleted

## 2020-01-06 NOTE — Telephone Encounter (Signed)
Patient called given negative covid results . 

## 2020-01-14 NOTE — Progress Notes (Signed)
Office Visit Note  Patient: Jane English             Date of Birth: 1971-09-24           MRN: [id number]             PCP: Fayrene Helper, MD Referring: Fayrene Helper, MD Visit Date: 01/20/2020 Occupation: @GUAROCC @  Subjective:  Other (increased pain, requested refill of compounded cream)   History of Present Illness: Jane English is a 49 y.o. female with history of osteoarthritis and degenerative disc disease.  She states last week she was coming down the steps and somehow fell she is not exactly sure how she fell but she back fell backwards and landed up on her buttocks.  She had some soreness in her back and her buttock area since then.  The pain is gradually getting better.  She had her right knee joint injection by her pain management doctor today.  Activities of Daily Living:  Patient reports morning stiffness for several hours.   Patient Reports nocturnal pain.  Difficulty dressing/grooming: Reports Difficulty climbing stairs: Reports Difficulty getting out of chair: Denies Difficulty using hands for taps, buttons, cutlery, and/or writing: Reports  Review of Systems  Constitutional: Negative for fatigue, night sweats, weight gain and weight loss.  HENT: Positive for mouth dryness. Negative for mouth sores, trouble swallowing, trouble swallowing and nose dryness.   Eyes: Positive for pain. Negative for redness, visual disturbance and dryness.  Respiratory: Negative for cough, shortness of breath and difficulty breathing.   Cardiovascular: Negative for chest pain, palpitations, hypertension, irregular heartbeat and swelling in legs/feet.  Gastrointestinal: Negative for blood in stool, constipation and diarrhea.  Endocrine: Negative for increased urination.  Genitourinary: Negative for difficulty urinating, painful urination and vaginal dryness.  Musculoskeletal: Positive for arthralgias, joint pain, myalgias, morning stiffness and myalgias. Negative for joint  swelling, muscle weakness and muscle tenderness.  Skin: Negative for color change, rash, hair loss, skin tightness, ulcers and sensitivity to sunlight.  Allergic/Immunologic: Negative for susceptible to infections.  Neurological: Negative for dizziness, numbness, headaches, memory loss, night sweats and weakness.  Hematological: Positive for bruising/bleeding tendency. Negative for swollen glands.  Psychiatric/Behavioral: Negative for depressed mood, confusion and sleep disturbance. The patient is not nervous/anxious.     PMFS History:  Patient Active Problem List   Diagnosis Date Noted  . Acute sinusitis 01/02/2020  . Urinary urgency 08/11/2019  . GAD (generalized anxiety disorder) 04/26/2019  . Depression, major, single episode, severe ([place]) 10/03/2018  . Vitamin D deficiency 12/14/2017  . Bilateral ankle pain 04/16/2017  . Amenorrhea 10/15/2016  . Paresthesia 05/06/2016  . Back pain with radiation 04/04/2016  . Obesity (BMI 30.0-34.9) 03/25/2016  . Neuropathic pain of finger of right hand 12/31/2015  . Pap smear of cervix shows high risk HPV present 08/08/2015  . Cyst of left ovary 02/06/2015  . Anovulation 02/06/2015  . Secondary amenorrhea 02/06/2015  . Intractable chronic migraine without aura and without status migrainosus 12/21/2014  . Migraine 09/21/2014  . Anemia 10/11/2013  . H. pylori duodenitis 07/07/2013  . PTSD (post-traumatic stress disorder) 03/26/2013  . Insomnia 03/18/2013  . Seasonal allergies 12/10/2012  . GERD (gastroesophageal reflux disease) 10/29/2012  . Chronic pain syndrome 10/06/2012  . Postlaminectomy syndrome 10/06/2012  . Cervical neck pain with evidence of disc disease 04/13/2012  . Allergic rhinitis 04/17/2010  . Cough variant asthma 03/04/2010  . Prediabetes 01/05/2009  . Essential hypertension 11/27/2007    Past Medical History:  Diagnosis Date  . Anemia   . Asthma   . Asthma flare 04/09/2013  . Back pain   . Bronchitis   . Chronic  abdominal pain   . Chronic constipation   . Constipation due to opioid therapy   . Depression   . Diabetes mellitus without complication ([place])   . Diabetes mellitus, type II ([place])   . DVT (deep venous thrombosis) ([place]) 2010  . GERD (gastroesophageal reflux disease)   . Heart murmur    no cardiologist  . Helicobacter pylori gastritis 06/11/2013   Colonoscopy Dr. Hilarie Fredrickson  . Hypertension   . IBS (irritable bowel syndrome)   . Migraine headache   . Neuropathy   . Obesity   . Obsessive-compulsive disorder   . PSYCHOTIC D/O W/HALLUCINATIONS CONDS CLASS ELSW 03/04/2010   Qualifier: Diagnosis of  By: Moshe Cipro MD, Joycelyn Schmid    . PTSD (post-traumatic stress disorder)   . SBO (small bowel obstruction) ([place]) 08/09/2013  . Seasonal allergies 12/10/2012  . Seizures ([place])   . Shortness of breath     Family History  Problem Relation Age of Onset  . Lung cancer Father   . Stomach cancer Father   . Esophageal cancer Father   . Alcohol abuse Father   . Mental illness Father   . Diabetes Sister   . Hypertension Sister   . Bipolar disorder Sister   . Schizophrenia Sister   . Diabetes Sister   . Alcohol abuse Brother   . Hypertension Brother   . Kidney disease Brother   . Diabetes Brother   . Drug abuse Brother   . Mental illness Brother   . Alcohol abuse Brother   . Alcohol abuse Brother   . Hypertension Brother   . Diabetes Brother   . Alcohol abuse Brother   . Physical abuse Mother   . Alcohol abuse Mother   . Cirrhosis Mother   . Mental illness Brother        in [place]  . Drug abuse Sister   . HIV Sister   . Alcohol abuse Brother   . Pneumonia Sister        died as a baby  . Alcohol abuse Brother   . Bipolar disorder Brother   . Bipolar disorder Daughter   . Bipolar disorder Son   . Bipolar disorder Son   . Hypertension Brother   . Bipolar disorder Brother   . Drug abuse Brother   . Alcohol abuse Brother   . Bipolar disorder Brother   . ADD / ADHD Neg Hx   . Anxiety disorder  Neg Hx   . Dementia Neg Hx   . Depression Neg Hx   . OCD Neg Hx   . Seizures Neg Hx   . Paranoid behavior Neg Hx   . Colon cancer Neg Hx    Past Surgical History:  Procedure Laterality Date  . ANTERIOR CERVICAL DECOMP/DISCECTOMY FUSION  07/07/2012   Procedure: ANTERIOR CERVICAL DECOMPRESSION/DISCECTOMY FUSION 2 LEVELS;  Surgeon: Floyce Stakes, MD;  Location: [facility];  Service: Neurosurgery;  Laterality: N/A;  Cervical four-five, five - six  Anterior cervical decompression/diskectomy/fusion/plate  . APPENDECTOMY  1986  . BOWEL RESECTION N/A 07/29/2013   Procedure: serosal repair;  Surgeon: Adin Hector, MD;  Location: [facility];  Service: General;  Laterality: N/A;  . CARPAL TUNNEL RELEASE Bilateral   . LAPAROSCOPY N/A 07/29/2013   Procedure: diagnostic laporoscopy;  Surgeon: Adin Hector, MD;  Location: [facility];  Service: General;  Laterality: N/A;  . LAPAROSCOPY N/A 08/16/2013   Procedure: LAPAROSCOPY DIAGNOSTIC/LYSIS OF ADHESIONS;  Surgeon: Adin Hector, MD;  Location: [facility];  Service: General;  Laterality: N/A;  . LAPAROTOMY N/A 08/16/2013   Procedure: EXPLORATORY LAPAROTOMY/SMALL BOWEL RESECTION (JEJUNUM);  Surgeon: Adin Hector, MD;  Location: [facility];  Service: General;  Laterality: N/A;  . LUMBAR SPINE SURGERY  2010   x 3  . LYSIS OF ADHESION  2003   Dr. Irving Shows  . LYSIS OF ADHESION N/A 07/29/2013   Procedure: LYSIS OF ADHESION;  Surgeon: Adin Hector, MD;  Location: [facility];  Service: General;  Laterality: N/A;  . OOPHORECTOMY    . PARTIAL HYSTERECTOMY  1990s?   [place], [place]  . SPINAL CORD STIMULATOR IMPLANT    . TRIGGER FINGER RELEASE  2009   right pinkie finger  . TUBAL LIGATION  1994   Social History   Social History Narrative  . Not on file   Immunization History  Administered Date(s) Administered  . Influenza Split 09/03/2012  . Influenza Whole 07/28/2007, 08/11/2009, 07/19/2010  . Influenza,inj,Quad PF,6+ Mos 09/13/2013, 08/12/2014,  08/01/2015, 07/24/2016, 08/14/2017, 08/12/2018, 08/10/2019  . PPD Test 02/22/2011, 06/26/2011, 09/18/2012, 11/29/2013, 12/06/2014, 12/09/2017  . Td 10/03/2005  . Tdap 12/09/2017     Objective: Vital Signs: BP 118/79 (BP Location: Left Arm, Patient Position: Sitting, Cuff Size: Normal)   Pulse 95   Resp 15   Ht 5\' 6"  (1.676 m)   Wt 193 lb 9.6 oz (87.8 kg)   BMI 31.25 kg/m    Physical Exam Vitals and nursing note reviewed.  Constitutional:      Appearance: She is well-developed.  HENT:     Head: Normocephalic and atraumatic.  Eyes:     Conjunctiva/sclera: Conjunctivae normal.  Cardiovascular:     Rate and Rhythm: Normal rate and regular rhythm.     Heart sounds: Normal heart sounds.  Pulmonary:     Effort: Pulmonary effort is normal.     Breath sounds: Normal breath sounds.  Abdominal:     General: Bowel sounds are normal.     Palpations: Abdomen is soft.  Musculoskeletal:     Cervical back: Normal range of motion.  Lymphadenopathy:     Cervical: No cervical adenopathy.  Skin:    General: Skin is warm and dry.     Capillary Refill: Capillary refill takes less than 2 seconds.  Neurological:     Mental Status: She is alert and oriented to person, place, and time.  Psychiatric:        Behavior: Behavior normal.      Musculoskeletal Exam: Patient has some discomfort range of motion of her cervical spine.  She has limited range of motion of the lumbar spine.  Shoulder joints, elbow joints, wrist joints, MCPs and PIPs with good range of motion.  She has tenderness on palpation of right lateral epicondyle area.  She has some DIP and PIP thickening in her hands.  She has crepitus in her bilateral knee joints.  Ankle joints are in good range of motion.  She has no tenderness over MTPs.  CDAI Exam: CDAI Score: -- Patient Global: --; Provider Global: -- Swollen: --; Tender: -- Joint Exam 01/20/2020   No joint exam has been documented for this visit   There is currently no  information documented on the homunculus. Go to the Rheumatology activity and complete the homunculus joint exam.  Investigation: No additional findings.  Imaging: No results found.  Recent Labs:  Lab Results  Component Value Date   WBC 5.7 08/17/2019   HGB 12.3 08/17/2019   PLT 315 08/17/2019   NA 138 12/06/2019   K 3.7 12/06/2019   CL 104 12/06/2019   CO2 26 12/06/2019   GLUCOSE 100 (H) 12/06/2019   BUN 13 12/06/2019   CREATININE 1.03 12/06/2019   BILITOT 0.3 12/06/2019   ALKPHOS 48 02/04/2019   AST 25 12/06/2019   ALT 32 (H) 12/06/2019   PROT 7.3 12/06/2019   ALBUMIN 4.1 02/04/2019   CALCIUM 9.7 12/06/2019   GFRAA 73 12/06/2019    Speciality Comments: No specialty comments available.  Procedures:  No procedures performed Allergies: Neurontin [gabapentin], Penicillins, Pregabalin, Tramadol, and Latex   Assessment / Plan:     Visit Diagnoses: Polyarthralgia - Autoimmune work-up was negative.   Primary osteoarthritis of both hands - clinical and radiographic findings are consistent with osteoarthritis in her hands.  He continues to have some discomfort in her hands.  She has been using arthritis class which has been helpful.  Chondromalacia of both patellae-she has crepitus in her bilateral knee joints.  She had right knee joint injection by her pain management doctor.  She had a recent fall as she slipped on the stairs.  She is uncertain why she fell.  We will refer to physical therapy for lower extremity muscle strengthening and fall prevention.  Lateral epicondylitis, right elbow - she has bilateral lateral epicondylitis more prominent in her right elbow joint.  I have advised her to use topical agent.  She wants refill on the topical agent.  We will refill that today.  DDD (degenerative disc disease), cervical - Status post C4-5 fusion by Dr. Joya Salm.    DDD (degenerative disc disease), lumbar - Status post L5-S1 fusion by Dr. Joya Salm.   Other medical problems are  listed as follows:  Vitamin D deficiency  Essential hypertension  Chronic pain syndrome  History of gastroesophageal reflux (GERD)  History of diabetes mellitus, type II  History of IBS  History of DVT (deep vein thrombosis)  Anxiety and depression  Orders: No orders of the defined types were placed in this encounter.  No orders of the defined types were placed in this encounter.    Follow-Up Instructions: Return in about 1 year (around 01/19/2021) for Osteoarthritis.   Bo Merino, MD  Note - This record has been created using Editor, commissioning.  Chart creation errors have been sought, but may not always  have been located. Such creation errors do not reflect on  the standard of medical care.

## 2020-01-16 ENCOUNTER — Encounter: Payer: Self-pay | Admitting: Family Medicine

## 2020-01-17 DIAGNOSIS — S0501XA Injury of conjunctiva and corneal abrasion without foreign body, right eye, initial encounter: Secondary | ICD-10-CM | POA: Diagnosis not present

## 2020-01-20 ENCOUNTER — Encounter: Payer: Self-pay | Admitting: Rheumatology

## 2020-01-20 ENCOUNTER — Encounter: Payer: Self-pay | Admitting: Physical Medicine and Rehabilitation

## 2020-01-20 ENCOUNTER — Encounter: Payer: Medicare HMO | Attending: Physical Medicine and Rehabilitation | Admitting: Physical Medicine and Rehabilitation

## 2020-01-20 ENCOUNTER — Other Ambulatory Visit: Payer: Self-pay

## 2020-01-20 ENCOUNTER — Ambulatory Visit: Payer: Medicare HMO | Admitting: Rheumatology

## 2020-01-20 VITALS — BP 118/79 | HR 95 | Resp 15 | Ht 66.0 in | Wt 193.6 lb

## 2020-01-20 VITALS — BP 125/83 | HR 102 | Temp 97.9°F | Ht 66.0 in | Wt 180.0 lb

## 2020-01-20 DIAGNOSIS — M94261 Chondromalacia, right knee: Secondary | ICD-10-CM | POA: Diagnosis not present

## 2020-01-20 DIAGNOSIS — G894 Chronic pain syndrome: Secondary | ICD-10-CM

## 2020-01-20 DIAGNOSIS — Z8639 Personal history of other endocrine, nutritional and metabolic disease: Secondary | ICD-10-CM

## 2020-01-20 DIAGNOSIS — M2550 Pain in unspecified joint: Secondary | ICD-10-CM | POA: Diagnosis not present

## 2020-01-20 DIAGNOSIS — M7918 Myalgia, other site: Secondary | ICD-10-CM | POA: Insufficient documentation

## 2020-01-20 DIAGNOSIS — Z8719 Personal history of other diseases of the digestive system: Secondary | ICD-10-CM

## 2020-01-20 DIAGNOSIS — M2242 Chondromalacia patellae, left knee: Secondary | ICD-10-CM

## 2020-01-20 DIAGNOSIS — Z79891 Long term (current) use of opiate analgesic: Secondary | ICD-10-CM | POA: Diagnosis not present

## 2020-01-20 DIAGNOSIS — I10 Essential (primary) hypertension: Secondary | ICD-10-CM | POA: Diagnosis not present

## 2020-01-20 DIAGNOSIS — F419 Anxiety disorder, unspecified: Secondary | ICD-10-CM

## 2020-01-20 DIAGNOSIS — E559 Vitamin D deficiency, unspecified: Secondary | ICD-10-CM

## 2020-01-20 DIAGNOSIS — M961 Postlaminectomy syndrome, not elsewhere classified: Secondary | ICD-10-CM | POA: Diagnosis not present

## 2020-01-20 DIAGNOSIS — Z86718 Personal history of other venous thrombosis and embolism: Secondary | ICD-10-CM

## 2020-01-20 DIAGNOSIS — M7711 Lateral epicondylitis, right elbow: Secondary | ICD-10-CM | POA: Diagnosis not present

## 2020-01-20 DIAGNOSIS — M19042 Primary osteoarthritis, left hand: Secondary | ICD-10-CM | POA: Diagnosis not present

## 2020-01-20 DIAGNOSIS — F32A Depression, unspecified: Secondary | ICD-10-CM

## 2020-01-20 DIAGNOSIS — M2241 Chondromalacia patellae, right knee: Secondary | ICD-10-CM

## 2020-01-20 DIAGNOSIS — M5030 Other cervical disc degeneration, unspecified cervical region: Secondary | ICD-10-CM

## 2020-01-20 DIAGNOSIS — M19041 Primary osteoarthritis, right hand: Secondary | ICD-10-CM | POA: Diagnosis not present

## 2020-01-20 DIAGNOSIS — Z5181 Encounter for therapeutic drug level monitoring: Secondary | ICD-10-CM | POA: Diagnosis not present

## 2020-01-20 DIAGNOSIS — F329 Major depressive disorder, single episode, unspecified: Secondary | ICD-10-CM

## 2020-01-20 DIAGNOSIS — M5136 Other intervertebral disc degeneration, lumbar region: Secondary | ICD-10-CM

## 2020-01-20 MED ORDER — NONFORMULARY OR COMPOUNDED ITEM: 0 refills | Status: AC

## 2020-01-20 MED ORDER — OXYCODONE HCL 5 MG PO TABS: 10.0000 mg | ORAL_TABLET | Freq: Two times a day (BID) | ORAL | 0 refills | Status: DC

## 2020-01-20 NOTE — Progress Notes (Signed)
R Knee injection  Indication:R Knee pain not relieved by medication management and other conservative care.  Informed consent was obtained after describing risks and benefits of the procedure with the patient, this includes bleeding, bruising, infection and medication side effects. The patient wishes to proceed and has given written consent. The patient was placed in a recumbent position. The medial aspect of the knee was marked and prepped with Betadine and alcohol. It was then entered with a 25-gauge 1-1/2 inch needle and Synvisc-One was injected into the joint space. The patient tolerated the procedure well. Post procedure instructions were given.

## 2020-01-20 NOTE — Addendum Note (Signed)
Addended by: Earnestine Mealing on: 01/20/2020 02:53 PM   Modules accepted: Orders

## 2020-01-26 ENCOUNTER — Other Ambulatory Visit: Payer: Self-pay

## 2020-01-26 ENCOUNTER — Encounter: Payer: Medicare HMO | Attending: Family Medicine | Admitting: Nutrition

## 2020-01-26 VITALS — Ht 66.0 in | Wt 191.6 lb

## 2020-01-26 DIAGNOSIS — Z713 Dietary counseling and surveillance: Secondary | ICD-10-CM | POA: Diagnosis not present

## 2020-01-26 DIAGNOSIS — I10 Essential (primary) hypertension: Secondary | ICD-10-CM

## 2020-01-26 DIAGNOSIS — E669 Obesity, unspecified: Secondary | ICD-10-CM

## 2020-01-26 DIAGNOSIS — R7303 Prediabetes: Secondary | ICD-10-CM

## 2020-01-26 NOTE — Progress Notes (Signed)
Medical Nutrition Therapy:  Appt start time: 1130 end time:  1200.  Assessment:  Primary concerns today: Overweight-weight gain., Pre DM. A1C 6.4% Cut out soda and cut down on eating cake and drinking water Exercising  on treadmill. Use to work out at [employer]. BMI is 30. Losing 10% of her weight would be a reasonable goal within 6 months.. Lab Results  Component Value Date   HGBA1C 6.4 (H) 01/04/2020   CMP Latest Ref Rng & Units 12/06/2019 04/21/2019 02/04/2019  Glucose 65 - 99 mg/dL 100(H) 90 84  BUN 7 - 25 mg/dL 13 11 10   Creatinine 0.50 - 1.05 mg/dL 1.03 1.00 1.06(H)  Sodium 135 - 146 mmol/L 138 139 136  Potassium 3.5 - 5.3 mmol/L 3.7 3.8 3.4(L)  Chloride 98 - 110 mmol/L 104 103 102  CO2 20 - 32 mmol/L 26 28 25   Calcium 8.6 - 10.4 mg/dL 9.7 9.3 9.3  Total Protein 6.1 - 8.1 g/dL 7.3 7.2 7.3  Total Bilirubin 0.2 - 1.2 mg/dL 0.3 0.4 0.6  Alkaline Phos 38 - 126 U/L - - 48  AST 10 - 35 U/L 25 28 25   ALT 6 - 29 U/L 32(H) 34(H) 39    BMI is 30. Losing 10% of her weight would be a reasonable goal within 6 months.. Lipid Panel     Component Value Date/Time   CHOL 195 12/06/2019 1508   TRIG 124 12/06/2019 1508   HDL 55 12/06/2019 1508   CHOLHDL 3.5 12/06/2019 1508   VLDL 26 06/09/2017 0846   LDLCALC 117 (H) 12/06/2019 1508   Wt Readings from Last 3 Encounters:  01/20/20 193 lb 9.6 oz (87.8 kg)  01/20/20 180 lb (81.6 kg)  12/31/19 190 lb 9.6 oz (86.5 kg)   Ht Readings from Last 3 Encounters:  01/20/20 5\' 6"  (1.676 m)  01/20/20 5\' 6"  (1.676 m)  12/31/19 5\' 6"  (1.676 m)   There is no height or weight on file to calculate BMI. @BMIFA @ Facility age limit for growth percentiles is 20 years. Facility age limit for growth percentiles is 20 years.  Preferred Learning Style:    No preference indicated   Learning Readiness:   Ready  Change in progress   MEDICATIONS:    DIETARY INTAKE:    24-hr recall:  B ( AM): Frosted flakes, juice,  Snk ( AM):  L ( PM):skips  lunch Snk ( PM):  D ( PM): PIzza 2 slices, water Snk ( PM): misc  Beverages: diet soda, tea or soda, water  Usual physical activity: labs   Estimated energy needs: 1200  calories 130 g carbohydrates 90 g protein 33 g fat  Progress Towards Goal(s):  In progress.   Nutritional Diagnosis:  NB-1.1 Food and nutrition-related knowledge deficit As related to Pre Dm and weight gain.  As evidenced by BMI 28 and A1C 5.8%.    Intervention: Nutrition and Pre Diabetes education provided on My Plate, CHO counting, meal planning, portion sizes, timing of meals, avoiding snacks between meals unless having a low blood sugar, target ranges for A1C and blood sugars, signs/symptoms and treatment of hyper/hypoglycemia, monitoring blood sugars, taking medications as prescribed, benefits of exercising 30 -60 minutes per day and prevention of DM. Stressed the need for a plant based diet and avoiding processed foods and increase foods high in phytochemicals and more fresh fruits and vegetables.   Goals  Follow The Plate Method Eat on meals on time Cut out sweets Drink more water-5 bottles per day. Exercise  30 minutes three times per week. Lose 1-2 lbs pr week Do not skip meals. Increase fresh fruits and vegetables. Get A1C 5.8%.  Teaching Method Utilized:  Visual Auditory Hands on  Handouts given during visit include:  The Plate Method   Meal Plan Card   Barriers to learning/adherence to lifestyle change: none  Demonstrated degree of understanding via:  Teach Back   Monitoring/Evaluation:  Dietary intake, exercise, and body weight in 1 month(s).

## 2020-01-26 NOTE — Patient Instructions (Signed)
Goals  Follow The Plate Method Eat on meals on time Cut out sweets Drink more water-5 bottles per day. Exercise 30 minutes three times per week. Lose 1-2 lbs pr week Do not skip meals. Increase fresh fruits and vegetables. Get A1C 5.8%.

## 2020-01-29 ENCOUNTER — Emergency Department (HOSPITAL_COMMUNITY): Payer: Medicare HMO

## 2020-01-29 ENCOUNTER — Emergency Department (HOSPITAL_COMMUNITY)
Admission: EM | Admit: 2020-01-29 | Discharge: 2020-01-29 | Disposition: A | Discharge status: Home / Self Care | Payer: Medicare HMO | Attending: Emergency Medicine | Admitting: Emergency Medicine

## 2020-01-29 ENCOUNTER — Encounter (HOSPITAL_COMMUNITY): Payer: Self-pay

## 2020-01-29 ENCOUNTER — Other Ambulatory Visit: Payer: Self-pay

## 2020-01-29 DIAGNOSIS — Z91040 Latex allergy status: Secondary | ICD-10-CM | POA: Insufficient documentation

## 2020-01-29 DIAGNOSIS — Z79899 Other long term (current) drug therapy: Secondary | ICD-10-CM | POA: Insufficient documentation

## 2020-01-29 DIAGNOSIS — R0981 Nasal congestion: Secondary | ICD-10-CM | POA: Diagnosis not present

## 2020-01-29 DIAGNOSIS — R438 Other disturbances of smell and taste: Secondary | ICD-10-CM | POA: Diagnosis not present

## 2020-01-29 DIAGNOSIS — I10 Essential (primary) hypertension: Secondary | ICD-10-CM | POA: Diagnosis not present

## 2020-01-29 DIAGNOSIS — J45909 Unspecified asthma, uncomplicated: Secondary | ICD-10-CM | POA: Insufficient documentation

## 2020-01-29 DIAGNOSIS — Z20822 Contact with and (suspected) exposure to covid-19: Secondary | ICD-10-CM

## 2020-01-29 DIAGNOSIS — R439 Unspecified disturbances of smell and taste: Secondary | ICD-10-CM | POA: Diagnosis not present

## 2020-01-29 DIAGNOSIS — Z9682 Presence of neurostimulator: Secondary | ICD-10-CM | POA: Insufficient documentation

## 2020-01-29 DIAGNOSIS — E119 Type 2 diabetes mellitus without complications: Secondary | ICD-10-CM | POA: Diagnosis not present

## 2020-01-29 NOTE — ED Triage Notes (Signed)
Pt reports loss of smell, cough, and SOB starting today. She has has company from out of [place] since Wednesday. 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

## 2020-01-29 NOTE — ED Provider Notes (Addendum)
[name] DEPT Provider Note   CSN: [id number] Arrival date & time: 01/29/20  2109     History Chief Complaint  Patient presents with  . [name] is a 49 y.o. female.  The history is provided by the patient and medical records.    49 y.o. F with hx of asthma, chronic abdominal pain, chronic constipation, depression, GERD, IBS, migraine headaches, presenting to the ED for concern of Covid.  States she did have guests from out of town this past week since Wednesday (01/26/20) -- they had traveled from [place] to [place] to [place] to [place].  One of the guests had covid last year, others did not.  None of her guests were displaying symptoms.  She states today upon waking she noticed she could not smell or taste food.  She does report a slight cough that is dry and non-productive.  No fevers or chills.  No other outside contacts she is aware of.  Husband is currently in [place] for work, he is not having any symptoms.  Past Medical History:  Diagnosis Date  . Anemia   . Asthma   . Asthma flare 04/09/2013  . Back pain   . Bronchitis   . Chronic abdominal pain   . Chronic constipation   . Constipation due to opioid therapy   . Depression   . Diabetes mellitus without complication ([place])   . Diabetes mellitus, type II ([place])   . DVT (deep venous thrombosis) ([place]) 2010  . GERD (gastroesophageal reflux disease)   . Heart murmur    no cardiologist  . Helicobacter pylori gastritis 06/11/2013   Colonoscopy Dr. Hilarie Fredrickson  . Hypertension   . IBS (irritable bowel syndrome)   . Migraine headache   . Neuropathy   . Obesity   . Obsessive-compulsive disorder   . PSYCHOTIC D/O W/HALLUCINATIONS CONDS CLASS ELSW 03/04/2010   Qualifier: Diagnosis of  By: Moshe Cipro MD, Joycelyn Schmid    . PTSD (post-traumatic stress disorder)   . SBO (small bowel obstruction) ([place]) 08/09/2013  . Seasonal allergies 12/10/2012  . Seizures ([place])   . Shortness of breath      Patient Active Problem List   Diagnosis Date Noted  . Acute sinusitis 01/02/2020  . Urinary urgency 08/11/2019  . GAD (generalized anxiety disorder) 04/26/2019  . Depression, major, single episode, severe ([place]) 10/03/2018  . Vitamin D deficiency 12/14/2017  . Bilateral ankle pain 04/16/2017  . Amenorrhea 10/15/2016  . Paresthesia 05/06/2016  . Back pain with radiation 04/04/2016  . Obesity (BMI 30.0-34.9) 03/25/2016  . Neuropathic pain of finger of right hand 12/31/2015  . Pap smear of cervix shows high risk HPV present 08/08/2015  . Cyst of left ovary 02/06/2015  . Anovulation 02/06/2015  . Secondary amenorrhea 02/06/2015  . Intractable chronic migraine without aura and without status migrainosus 12/21/2014  . Migraine 09/21/2014  . Anemia 10/11/2013  . H. pylori duodenitis 07/07/2013  . PTSD (post-traumatic stress disorder) 03/26/2013  . Insomnia 03/18/2013  . Seasonal allergies 12/10/2012  . GERD (gastroesophageal reflux disease) 10/29/2012  . Chronic pain syndrome 10/06/2012  . Postlaminectomy syndrome 10/06/2012  . Cervical neck pain with evidence of disc disease 04/13/2012  . Allergic rhinitis 04/17/2010  . Cough variant asthma 03/04/2010  . Prediabetes 01/05/2009  . Essential hypertension 11/27/2007    Past Surgical History:  Procedure Laterality Date  . ANTERIOR CERVICAL DECOMP/DISCECTOMY FUSION  07/07/2012   Procedure: ANTERIOR CERVICAL DECOMPRESSION/DISCECTOMY FUSION 2 LEVELS;  Surgeon: Floyce Stakes, MD;  Location: [facility]UR[facility]S;  Service: Neurosurgery;  Laterality: N/A;  Cervical four-five, five - six  Anterior cervical decompression/diskectomy/fusion/plate  . APPENDECTOMY  1986  . BOWEL RESECTION N/A 07/29/2013   Procedure: serosal repair;  Surgeon: Adin Hector, MD;  Location: [facility] ORS;  Service: General;  Laterality: N/A;  . CARPAL TUNNEL RELEASE Bilateral   . LAPAROSCOPY N/A 07/29/2013   Procedure: diagnostic laporoscopy;  Surgeon: Adin Hector, MD;   Location: [facility];  Service: General;  Laterality: N/A;  . LAPAROSCOPY N/A 08/16/2013   Procedure: LAPAROSCOPY DIAGNOSTIC/LYSIS OF ADHESIONS;  Surgeon: Adin Hector, MD;  Location: [facility];  Service: General;  Laterality: N/A;  . LAPAROTOMY N/A 08/16/2013   Procedure: EXPLORATORY LAPAROTOMY/SMALL BOWEL RESECTION (JEJUNUM);  Surgeon: Adin Hector, MD;  Location: [facility];  Service: General;  Laterality: N/A;  . LUMBAR SPINE SURGERY  2010   x 3  . LYSIS OF ADHESION  2003   Dr. Irving Shows  . LYSIS OF ADHESION N/A 07/29/2013   Procedure: LYSIS OF ADHESION;  Surgeon: Adin Hector, MD;  Location: [facility];  Service: General;  Laterality: N/A;  . OOPHORECTOMY    . PARTIAL HYSTERECTOMY  1990s?   [place], [place]  . SPINAL CORD STIMULATOR IMPLANT    . TRIGGER FINGER RELEASE  2009   right pinkie finger  . TUBAL LIGATION  1994     OB History    Gravida  5   Para  4   Term  4   Preterm      AB  1   Living  4     SAB  1   TAB      Ectopic      Multiple      Live Births  4           Family History  Problem Relation Age of Onset  . Lung cancer Father   . Stomach cancer Father   . Esophageal cancer Father   . Alcohol abuse Father   . Mental illness Father   . Diabetes Sister   . Hypertension Sister   . Bipolar disorder Sister   . Schizophrenia Sister   . Diabetes Sister   . Alcohol abuse Brother   . Hypertension Brother   . Kidney disease Brother   . Diabetes Brother   . Drug abuse Brother   . Mental illness Brother   . Alcohol abuse Brother   . Alcohol abuse Brother   . Hypertension Brother   . Diabetes Brother   . Alcohol abuse Brother   . Physical abuse Mother   . Alcohol abuse Mother   . Cirrhosis Mother   . Mental illness Brother        in [place]  . Drug abuse Sister   . HIV Sister   . Alcohol abuse Brother   . Pneumonia Sister        died as a baby  . Alcohol abuse Brother   . Bipolar disorder Brother   . Bipolar disorder Daughter   . Bipolar  disorder Son   . Bipolar disorder Son   . Hypertension Brother   . Bipolar disorder Brother   . Drug abuse Brother   . Alcohol abuse Brother   . Bipolar disorder Brother   . ADD / ADHD Neg Hx   . Anxiety disorder Neg Hx   . Dementia Neg Hx   . Depression Neg Hx   . OCD  Neg Hx   . Seizures Neg Hx   . Paranoid behavior Neg Hx   . Colon cancer Neg Hx     Social History   Tobacco Use  . Smoking status: Never Smoker  . Smokeless tobacco: Never Used  Substance Use Topics  . Alcohol use: No  . Drug use: No    Home Medications Prior to Admission medications   Medication Sig Start Date End Date Taking? Authorizing Provider  albuterol (PROVENTIL) (2.5 MG/3ML) 0.083% nebulizer solution Take 3 mLs (2.5 mg total) by nebulization every 6 (six) hours as needed for wheezing or shortness of breath. 12/14/19   Fayrene Helper, MD  albuterol (VENTOLIN HFA) 108 (90 Base) MCG/ACT inhaler TAKE 2 PUFFS BY MOUTH EVERY 6 HOURS AS NEEDED FOR WHEEZE OR SHORTNESS OF BREATH 12/14/19   Fayrene Helper, MD  ALPRAZolam Duanne Moron) 1 MG tablet Take one tablet by mouth once daily for anxiety Patient taking differently: Take 0.5 mg by mouth daily. Take one tablet by mouth once daily for anxiety 12/31/19   Fayrene Helper, MD  amitriptyline (ELAVIL) 150 MG tablet Take 0.5 tablets (75 mg total) by mouth at bedtime. 10/27/19   Raulkar, Clide Deutscher, MD  azelastine (ASTELIN) 0.1 % nasal spray Place 2 sprays into both nostrils 2 (two) times daily. Use in each nostril as directed 02/19/19   Fayrene Helper, MD  azithromycin [facility]) 250 MG tablet Take two tablets on day one then take one tablet once daily for an additional four days 12/28/19   Fayrene Helper, MD  beclomethasone (QVAR REDIHALER) 40 MCG/ACT inhaler Inhale 2 puffs into the lungs 2 (two) times daily. 12/14/19   Fayrene Helper, MD  budesonide-formoterol [facility]) 160-4.5 MCG/ACT inhaler Inhale 2 puffs into the lungs 2 (two) times daily. PRN  12/14/19   Fayrene Helper, MD  Cetirizine HCl (ZYRTEC ALLERGY) 10 MG CAPS Take 1 capsule (10 mg total) by mouth 1 day or 1 dose for 1 dose. 12/03/19 01/20/20  Avegno, Darrelyn Hillock, FNP  cloNIDine (CATAPRES) 0.1 MG tablet TAKE 1 TABLET BY MOUTH EVERYDAY AT BEDTIME 09/12/19   Fayrene Helper, MD  clotrimazole-betamethasone (LOTRISONE) cream APPLY TO AFFECTED AREA TWICE A DAY 12/14/19   Fayrene Helper, MD  diclofenac Sodium (VOLTAREN) 1 % GEL Apply topically 4 (four) times daily.    [provider]  ergocalciferol (VITAMIN D2) 1.25 MG (50000 UT) capsule Take 1 capsule (50,000 Units total) by mouth once a week. One capsule once weekly 12/08/19   Fayrene Helper, MD  LINZESS 290 MCG CAPS capsule TAKE 1 CAPSULE (290 MCG TOTAL) BY MOUTH DAILY BEFORE BREAKFAST. 06/21/19   Pyrtle, Lajuan Lines, MD  loratadine (CLARITIN) 10 MG tablet Take 1 tablet (10 mg total) by mouth daily. 12/08/19   Fayrene Helper, MD  lubiprostone (AMITIZA) 24 MCG capsule Take 1 capsule (24 mcg total) by mouth 2 (two) times daily. 06/21/19   Pyrtle, Lajuan Lines, MD  medroxyPROGESTERone (PROVERA) 10 MG tablet TAKE 1 TABLET BY MOUTH DAILY FOR 2 WEEKS. REPEAT AS DIRECTED BY PHYSICIAN EVERY 3 MONTHS 10/26/19   [provider]  montelukast (SINGULAIR) 10 MG tablet TAKE 1 TABLET BY MOUTH EVERYDAY AT BEDTIME 12/06/19   Fayrene Helper, MD  [facility] 4 MG/0.1ML LIQD nasal spray kit Place 4 mg into the nose as directed. 03/10/18   [provider]  NONFORMULARY OR COMPOUNDED ITEM Apply 1-2 pumps, 3-4 times daily as needed. 01/20/20   Bo Merino, MD  olopatadine (PATANOL) 0.1 % ophthalmic solution Place 1 drop into both eyes 2 (two) times daily. 04/14/19   Fayrene Helper, MD  oxyCODONE (ROXICODONE) 5 MG immediate release tablet Take 2 tablets (10 mg total) by mouth 2 (two) times daily. 01/20/20 01/19/21  Izora Ribas, MD  pantoprazole (PROTONIX) 40 MG tablet TAKE 1 TABLET BY MOUTH EVERY DAY 06/16/19   Pyrtle, Lajuan Lines,  MD  phentermine (ADIPEX-P) 37.5 MG tablet Take 1 tablet (37.5 mg total) by mouth daily before breakfast. Take one half tablet once daily at breakfast for appetite suppression 12/28/19   Fayrene Helper, MD  potassium chloride 20 MEQ/15ML (10%) SOLN Take 15 mLs (20 mEq total) by mouth 2 (two) times daily. 04/21/19   Fayrene Helper, MD  solifenacin (VESICARE) 5 MG tablet Take 1 tablet (5 mg total) by mouth daily. 08/11/19   Fayrene Helper, MD  triamterene-hydrochlorothiazide (UUVOZDG-64) 37.5-25 MG tablet TAKE 1 TABLET BY MOUTH EVERY DAY Patient taking differently: 0.5 tablets. TAKE 1 TABLET BY MOUTH EVERY DAY 06/07/19   Fayrene Helper, MD  trolamine salicylate (ASPERCREME) 10 % cream Apply 1 application topically as needed for muscle pain.    [provider]    Allergies    Neurontin [gabapentin], Penicillins, Pregabalin, Tramadol, and Latex  Review of Systems   Review of Systems  HENT:       Loss of taste/smell  Respiratory: Positive for cough.   All other systems reviewed and are negative.   Physical Exam Updated Vital Signs BP 128/81 (BP Location: Left Arm)   Pulse (!) 109   Temp 97.9 F (36.6 C) (Oral)   Resp 16   Ht '5\' 6"'  (1.676 m)   Wt 87.5 kg   SpO2 98%   BMI 31.15 kg/m   Physical Exam Vitals and nursing note reviewed.  Constitutional:      Appearance: She is well-developed.  HENT:     Head: Normocephalic and atraumatic.     Nose:     Comments: Sounds congested Eyes:     Conjunctiva/sclera: Conjunctivae normal.     Pupils: Pupils are equal, round, and reactive to light.  Cardiovascular:     Rate and Rhythm: Normal rate and regular rhythm.     Heart sounds: Normal heart sounds.  Pulmonary:     Effort: Pulmonary effort is normal.     Breath sounds: Normal breath sounds. No wheezing or rhonchi.     Comments: Deep breathing induces dry, hacking cough but lungs overall clear, able to speak in paragraphs without issue, O2 sats 100% on  RA Abdominal:     General: Bowel sounds are normal.     Palpations: Abdomen is soft.  Musculoskeletal:        General: Normal range of motion.     Cervical back: Normal range of motion.  Skin:    General: Skin is warm and dry.  Neurological:     Mental Status: She is alert and oriented to person, place, and time.     ED Results / Procedures / Treatments   Labs (all labs ordered are listed, but only abnormal results are displayed) Labs Reviewed  SARS CORONAVIRUS 2 (TAT 6-24 HRS)    EKG None  Radiology No results found.  Procedures Procedures (including critical care time)  Medications Ordered in ED Medications - No data to display  ED Course  I have reviewed the triage vital signs and the nursing notes.  Pertinent labs & imaging results that were  available during my care of the patient were reviewed by me and considered in my medical decision making (see chart for details).    MDM Rules/Calculators/A&P  49 year old female presenting to the ED with dry cough along with loss of taste and smell that began this morning.  She did have out of town visitors for a few days this week, 1 of which had Covid last year but others were asymptomatic.  She is afebrile and nontoxic in appearance here.  She sounds congested but is in no acute respiratory distress and vitals are stable on room air.  Her lungs are grossly clear, deep breathing does induce dry coughing.  Chest x-ray was obtained, no acute findings noted.  Clinical suspicion for COVID-19.  Swab was performed here and plan to discharge home with quarantine instructions and symptomatic care as she is not having any signs or symptoms that would warrant admission at this time.  She will need to follow-up closely with her primary care doctor.  She may return here for any new or acute changes.  Jane English was evaluated in Emergency Department on 01/30/2020 for the symptoms described in the history of present illness. She was  evaluated in the context of the global COVID-19 pandemic, which necessitated consideration that the patient might be at risk for infection with the SARS-CoV-2 virus that causes COVID-19. Institutional protocols and algorithms that pertain to the evaluation of patients at risk for COVID-19 are in a state of rapid change based on information released by regulatory bodies including the CDC and federal and state organizations. These policies and algorithms were followed during the patient's care in the ED.  Final Clinical Impression(s) / ED Diagnoses Final diagnoses:  Suspected COVID-19 virus infection    Rx / DC Orders ED Discharge Orders    None       Larene Pickett, PA-C 01/29/20 2300    Larene Pickett, PA-C 01/30/20 1003    Veryl Speak, MD 01/30/20 [phone]

## 2020-01-29 NOTE — Discharge Instructions (Signed)
Can try using over the counter vitamin C and zinc to help boost immune system.  Recommend tylenol for fever and/or body aches if they develop. Follow-up closely with your primary care doctor. Return here for any new/acute changes-- uncontrolled fever, trouble breathing, chest pain, etc.     Person Under Monitoring Name: Jane English  Location: [address] [place] [address]   Infection Prevention Recommendations for Individuals Confirmed to have, or Being Evaluated for, 2019 Novel Coronavirus (COVID-19) Infection Who Receive Care at Home  Individuals who are confirmed to have, or are being evaluated for, COVID-19 should follow the prevention steps below until a healthcare provider or local or state health department says they can return to normal activities.  Stay home except to get medical care You should restrict activities outside your home, except for getting medical care. Do not go to work, school, or public areas, and do not use public transportation or taxis.  Call ahead before visiting your doctor Before your medical appointment, call the healthcare provider and tell them that you have, or are being evaluated for, COVID-19 infection. This will help the healthcare provider's office take steps to keep other people from getting infected. Ask your healthcare provider to call the local or state health department.  Monitor your symptoms Seek prompt medical attention if your illness is worsening (e.g., difficulty breathing). Before going to your medical appointment, call the healthcare provider and tell them that you have, or are being evaluated for, COVID-19 infection. Ask your healthcare provider to call the local or state health department.  Wear a facemask You should wear a facemask that covers your nose and mouth when you are in the same room with other people and when you visit a healthcare provider. People who live with or visit you should also wear a  facemask while they are in the same room with you.  Separate yourself from other people in your home As much as possible, you should stay in a different room from other people in your home. Also, you should use a separate bathroom, if available.  Avoid sharing household items You should not share dishes, drinking glasses, cups, eating utensils, towels, bedding, or other items with other people in your home. After using these items, you should wash them thoroughly with soap and water.  Cover your coughs and sneezes Cover your mouth and nose with a tissue when you cough or sneeze, or you can cough or sneeze into your sleeve. Throw used tissues in a lined trash can, and immediately wash your hands with soap and water for at least 20 seconds or use an alcohol-based hand rub.  Wash your [facility] your hands often and thoroughly with soap and water for at least 20 seconds. You can use an alcohol-based hand sanitizer if soap and water are not available and if your hands are not visibly dirty. Avoid touching your eyes, nose, and mouth with unwashed hands.   Prevention Steps for Caregivers and Household Members of Individuals Confirmed to have, or Being Evaluated for, COVID-19 Infection Being Cared for in the Home  If you live with, or provide care at home for, a person confirmed to have, or being evaluated for, COVID-19 infection please follow these guidelines to prevent infection:  Follow healthcare provider's instructions Make sure that you understand and can help the patient follow any healthcare provider instructions for all care.  Provide for the patient's basic needs You should help the patient with basic needs in  the home and provide support for getting groceries, prescriptions, and other personal needs.  Monitor the patient's symptoms If they are getting sicker, call his or her medical provider and tell them that the patient has, or is being evaluated for, COVID-19 infection.  This will help the healthcare provider's office take steps to keep other people from getting infected. Ask the healthcare provider to call the local or state health department.  Limit the number of people who have contact with the patient If possible, have only one caregiver for the patient. Other household members should stay in another home or place of residence. If this is not possible, they should stay in another room, or be separated from the patient as much as possible. Use a separate bathroom, if available. Restrict visitors who do not have an essential need to be in the home.  Keep older adults, very young children, and other sick people away from the patient Keep older adults, very young children, and those who have compromised immune systems or chronic health conditions away from the patient. This includes people with chronic heart, lung, or kidney conditions, diabetes, and cancer.  Ensure good ventilation Make sure that shared spaces in the home have good air flow, such as from an air conditioner or an opened window, weather permitting.  Wash your hands often Wash your hands often and thoroughly with soap and water for at least 20 seconds. You can use an alcohol based hand sanitizer if soap and water are not available and if your hands are not visibly dirty. Avoid touching your eyes, nose, and mouth with unwashed hands. Use disposable paper towels to dry your hands. If not available, use dedicated cloth towels and replace them when they become wet.  Wear a facemask and gloves Wear a disposable facemask at all times in the room and gloves when you touch or have contact with the patient's blood, body fluids, and/or secretions or excretions, such as sweat, saliva, sputum, nasal mucus, vomit, urine, or feces.  Ensure the mask fits over your nose and mouth tightly, and do not touch it during use. Throw out disposable facemasks and gloves after using them. Do not reuse. Wash your hands  immediately after removing your facemask and gloves. If your personal clothing becomes contaminated, carefully remove clothing and launder. Wash your hands after handling contaminated clothing. Place all used disposable facemasks, gloves, and other waste in a lined container before disposing them with other household waste. Remove gloves and wash your hands immediately after handling these items.  Do not share dishes, glasses, or other household items with the patient Avoid sharing household items. You should not share dishes, drinking glasses, cups, eating utensils, towels, bedding, or other items with a patient who is confirmed to have, or being evaluated for, COVID-19 infection. After the person uses these items, you should wash them thoroughly with soap and water.  Wash laundry thoroughly Immediately remove and wash clothes or bedding that have blood, body fluids, and/or secretions or excretions, such as sweat, saliva, sputum, nasal mucus, vomit, urine, or feces, on them. Wear gloves when handling laundry from the patient. Read and follow directions on labels of laundry or clothing items and detergent. In general, wash and dry with the warmest temperatures recommended on the label.  Clean all areas the individual has used often Clean all touchable surfaces, such as counters, tabletops, doorknobs, bathroom fixtures, toilets, phones, keyboards, tablets, and bedside tables, every day. Also, clean any surfaces that may have blood, body fluids,  and/or secretions or excretions on them. Wear gloves when cleaning surfaces the patient has come in contact with. Use a diluted bleach solution (e.g., dilute bleach with 1 part bleach and 10 parts water) or a household disinfectant with a label that says EPA-registered for coronaviruses. To make a bleach solution at home, add 1 tablespoon of bleach to 1 quart (4 cups) of water. For a larger supply, add  cup of bleach to 1 gallon (16 cups) of water. Read  labels of cleaning products and follow recommendations provided on product labels. Labels contain instructions for safe and effective use of the cleaning product including precautions you should take when applying the product, such as wearing gloves or eye protection and making sure you have good ventilation during use of the product. Remove gloves and wash hands immediately after cleaning.  Monitor yourself for signs and symptoms of illness Caregivers and household members are considered close contacts, should monitor their health, and will be asked to limit movement outside of the home to the extent possible. Follow the monitoring steps for close contacts listed on the symptom monitoring form.   ? If you have additional questions, contact your local health department or call the epidemiologist on call at [phone] (available 24/7). ? This guidance is subject to change. For the most up-to-date guidance from [facility], please refer to their website: [email or web address]

## 2020-01-30 LAB — SARS CORONAVIRUS 2 (TAT 6-24 HRS): SARS Coronavirus 2: NEGATIVE

## 2020-02-01 ENCOUNTER — Encounter: Payer: Self-pay | Admitting: Family Medicine

## 2020-02-01 ENCOUNTER — Other Ambulatory Visit: Payer: Self-pay

## 2020-02-01 ENCOUNTER — Ambulatory Visit (INDEPENDENT_AMBULATORY_CARE_PROVIDER_SITE_OTHER): Payer: Medicare HMO | Admitting: Family Medicine

## 2020-02-01 VITALS — BP 140/78 | HR 100 | Temp 97.8°F | Ht 66.0 in | Wt 189.6 lb

## 2020-02-01 DIAGNOSIS — J45991 Cough variant asthma: Secondary | ICD-10-CM

## 2020-02-01 DIAGNOSIS — J302 Other seasonal allergic rhinitis: Secondary | ICD-10-CM

## 2020-02-01 MED ORDER — CETIRIZINE HCL 10 MG PO TABS: 10.0000 mg | ORAL_TABLET | Freq: Every day | ORAL | 11 refills | Status: DC

## 2020-02-01 NOTE — Progress Notes (Signed)
Acute Office Visit  Subjective:    Patient ID: Jane English, female    DOB: [birth year], 49 y.o.   MRN: [id number]  Chief Complaint  Patient presents with  . URI    was seen at hospital on 01/29/20. No better and need something for cold symptoms. Symptoms are cough,. running nose, loss of taste, smell, running nose , headache,. Chest xray was nen. Was told to take vit c and zinc    HPI Patient is in today for  Pt with hair appointment on Sat-family visited last week over the weekend-friends Mom +COVID Seen in ER Sat for COVID testing due to potential exposure-COVID negative-pt with lack of smell and taste. Cough-dry-using symbicort twice a day.  Pt using nebulizer in the morning and evening. No fever.   Past Medical History:  Diagnosis Date  . Anemia   . Asthma   . Asthma flare 04/09/2013  . Back pain   . Bronchitis   . Chronic abdominal pain   . Chronic constipation   . Constipation due to opioid therapy   . Depression   . Diabetes mellitus without complication ([place])   . Diabetes mellitus, type II ([place])   . DVT (deep venous thrombosis) ([place]) 2010  . GERD (gastroesophageal reflux disease)   . Heart murmur    no cardiologist  . Helicobacter pylori gastritis 06/11/2013   Colonoscopy Dr. Hilarie Fredrickson  . Hypertension   . IBS (irritable bowel syndrome)   . Migraine headache   . Neuropathy   . Obesity   . Obsessive-compulsive disorder   . PSYCHOTIC D/O W/HALLUCINATIONS CONDS CLASS ELSW 03/04/2010   Qualifier: Diagnosis of  By: Moshe Cipro MD, Joycelyn Schmid    . PTSD (post-traumatic stress disorder)   . SBO (small bowel obstruction) ([place]) 08/09/2013  . Seasonal allergies 12/10/2012  . Seizures ([place])   . Shortness of breath     Past Surgical History:  Procedure Laterality Date  . ANTERIOR CERVICAL DECOMP/DISCECTOMY FUSION  07/07/2012   Procedure: ANTERIOR CERVICAL DECOMPRESSION/DISCECTOMY FUSION 2 LEVELS;  Surgeon: Floyce Stakes, MD;  Location: [facility];  Service: Neurosurgery;   Laterality: N/A;  Cervical four-five, five - six  Anterior cervical decompression/diskectomy/fusion/plate  . APPENDECTOMY  1986  . BOWEL RESECTION N/A 07/29/2013   Procedure: serosal repair;  Surgeon: Adin Hector, MD;  Location: [facility];  Service: General;  Laterality: N/A;  . CARPAL TUNNEL RELEASE Bilateral   . LAPAROSCOPY N/A 07/29/2013   Procedure: diagnostic laporoscopy;  Surgeon: Adin Hector, MD;  Location: [facility];  Service: General;  Laterality: N/A;  . LAPAROSCOPY N/A 08/16/2013   Procedure: LAPAROSCOPY DIAGNOSTIC/LYSIS OF ADHESIONS;  Surgeon: Adin Hector, MD;  Location: [facility];  Service: General;  Laterality: N/A;  . LAPAROTOMY N/A 08/16/2013   Procedure: EXPLORATORY LAPAROTOMY/SMALL BOWEL RESECTION ([place]);  Surgeon: Adin Hector, MD;  Location: [facility];  Service: General;  Laterality: N/A;  . LUMBAR SPINE SURGERY  2010   x 3  . LYSIS OF ADHESION  2003   Dr. Irving Shows  . LYSIS OF ADHESION N/A 07/29/2013   Procedure: LYSIS OF ADHESION;  Surgeon: Adin Hector, MD;  Location: [facility];  Service: General;  Laterality: N/A;  . OOPHORECTOMY    . PARTIAL HYSTERECTOMY  1990s?   [place], [place]  . SPINAL CORD STIMULATOR IMPLANT    . TRIGGER FINGER RELEASE  2009   right pinkie finger  . TUBAL LIGATION  1994    Family History  Problem Relation Age  of Onset  . Lung cancer Father   . Stomach cancer Father   . Esophageal cancer Father   . Alcohol abuse Father   . Mental illness Father   . Diabetes Sister   . Hypertension Sister   . Bipolar disorder Sister   . Schizophrenia Sister   . Diabetes Sister   . Alcohol abuse Brother   . Hypertension Brother   . Kidney disease Brother   . Diabetes Brother   . Drug abuse Brother   . Mental illness Brother   . Alcohol abuse Brother   . Alcohol abuse Brother   . Hypertension Brother   . Diabetes Brother   . Alcohol abuse Brother   . Physical abuse Mother   . Alcohol abuse Mother   . Cirrhosis Mother   . Mental illness  Brother        in [place]  . Drug abuse Sister   . HIV Sister   . Alcohol abuse Brother   . Pneumonia Sister        died as a baby  . Alcohol abuse Brother   . Bipolar disorder Brother   . Bipolar disorder Daughter   . Bipolar disorder Son   . Bipolar disorder Son   . Hypertension Brother   . Bipolar disorder Brother   . Drug abuse Brother   . Alcohol abuse Brother   . Bipolar disorder Brother   . ADD / ADHD Neg Hx   . Anxiety disorder Neg Hx   . Dementia Neg Hx   . Depression Neg Hx   . OCD Neg Hx   . Seizures Neg Hx   . Paranoid behavior Neg Hx   . Colon cancer Neg Hx     Social History   Socioeconomic History  . Marital status: Married    Spouse name: Not on file  . Number of children: 4  . Years of education: Not on file  . Highest education level: Not on file  Occupational History  . Not on file  Tobacco Use  . Smoking status: Never Smoker  . Smokeless tobacco: Never Used  Substance and Sexual Activity  . Alcohol use: No  . Drug use: No  . Sexual activity: Yes    Partners: Male    Birth control/protection: Surgical    Comment: tubal  Other Topics Concern  . Not on file  Social History Narrative  . Not on file   Social Determinants of Health   Financial Resource Strain:   . Difficulty of Paying Living Expenses:   Food Insecurity:   . Worried About Charity fundraiser in the Last Year:   . Arboriculturist in the Last Year:   Transportation Needs:   . Film/video editor (Medical):   Marland Kitchen Lack of Transportation (Non-Medical):   Physical Activity:   . Days of Exercise per Week:   . Minutes of Exercise per Session:   Stress:   . Feeling of Stress :   Social Connections:   . Frequency of Communication with Friends and Family:   . Frequency of Social Gatherings with Friends and Family:   . Attends Religious Services:   . Active Member of Clubs or Organizations:   . Attends Archivist Meetings:   Marland Kitchen Marital Status:   Intimate Partner  Violence:   . Fear of Current or Ex-Partner:   . Emotionally Abused:   Marland Kitchen Physically Abused:   . Sexually Abused:     Outpatient Medications  Prior to Visit  Medication Sig Dispense Refill  . albuterol (PROVENTIL) (2.5 MG/3ML) 0.083% nebulizer solution Take 3 mLs (2.5 mg total) by nebulization every 6 (six) hours as needed for wheezing or shortness of breath. 150 mL 1  . albuterol (VENTOLIN HFA) 108 (90 Base) MCG/ACT inhaler TAKE 2 PUFFS BY MOUTH EVERY 6 HOURS AS NEEDED FOR WHEEZE OR SHORTNESS OF BREATH 18 g 4  . ALPRAZolam (XANAX) 1 MG tablet Take one tablet by mouth once daily for anxiety (Patient taking differently: Take 0.5 mg by mouth daily. Take one tablet by mouth once daily for anxiety) 30 tablet 5  . amitriptyline (ELAVIL) 150 MG tablet Take 0.5 tablets (75 mg total) by mouth at bedtime. 30 tablet 1  . azelastine (ASTELIN) 0.1 % nasal spray Place 2 sprays into both nostrils 2 (two) times daily. Use in each nostril as directed 30 mL 12  . beclomethasone (QVAR REDIHALER) 40 MCG/ACT inhaler Inhale 2 puffs into the lungs 2 (two) times daily. 10.6 g 5  . budesonide-formoterol (SYMBICORT) 160-4.5 MCG/ACT inhaler Inhale 2 puffs into the lungs 2 (two) times daily. PRN 1 Inhaler 3  . cloNIDine (CATAPRES) 0.1 MG tablet TAKE 1 TABLET BY MOUTH EVERYDAY AT BEDTIME 90 tablet 1  . clotrimazole-betamethasone (LOTRISONE) cream APPLY TO AFFECTED AREA TWICE A DAY 45 g 5  . diclofenac Sodium (VOLTAREN) 1 % GEL Apply topically 4 (four) times daily.    . ergocalciferol (VITAMIN D2) 1.25 MG (50000 UT) capsule Take 1 capsule (50,000 Units total) by mouth once a week. One capsule once weekly 12 capsule 1  . LINZESS 290 MCG CAPS capsule TAKE 1 CAPSULE (290 MCG TOTAL) BY MOUTH DAILY BEFORE BREAKFAST. 90 capsule 2  . loratadine (CLARITIN) 10 MG tablet Take 1 tablet (10 mg total) by mouth daily. 90 tablet 1  . medroxyPROGESTERone (PROVERA) 10 MG tablet TAKE 1 TABLET BY MOUTH DAILY FOR 2 WEEKS. REPEAT AS DIRECTED BY  PHYSICIAN EVERY 3 MONTHS    . montelukast (SINGULAIR) 10 MG tablet TAKE 1 TABLET BY MOUTH EVERYDAY AT BEDTIME 90 tablet 0  . NARCAN 4 MG/0.1ML LIQD nasal spray kit Place 4 mg into the nose as directed.  1  . NONFORMULARY OR COMPOUNDED ITEM Apply 1-2 pumps, 3-4 times daily as needed. 1 each 0  . olopatadine (PATANOL) 0.1 % ophthalmic solution Place 1 drop into both eyes 2 (two) times daily. 5 mL 12  . oxyCODONE (ROXICODONE) 5 MG immediate release tablet Take 2 tablets (10 mg total) by mouth 2 (two) times daily. 120 tablet 0  . pantoprazole (PROTONIX) 40 MG tablet TAKE 1 TABLET BY MOUTH EVERY DAY 90 tablet 2  . phentermine (ADIPEX-P) 37.5 MG tablet Take 1 tablet (37.5 mg total) by mouth daily before breakfast. Take one half tablet once daily at breakfast for appetite suppression 30 tablet 1  . potassium chloride 20 MEQ/15ML (10%) SOLN Take 15 mLs (20 mEq total) by mouth 2 (two) times daily. 473 mL 1  . solifenacin (VESICARE) 5 MG tablet Take 1 tablet (5 mg total) by mouth daily. 30 tablet 3  . triamterene-hydrochlorothiazide (MAXZIDE-25) 37.5-25 MG tablet TAKE 1 TABLET BY MOUTH EVERY DAY (Patient taking differently: 0.5 tablets. TAKE 1 TABLET BY MOUTH EVERY DAY) 90 tablet 1  . trolamine salicylate (ASPERCREME) 10 % cream Apply 1 application topically as needed for muscle pain.    . Cetirizine HCl (ZYRTEC ALLERGY) 10 MG CAPS Take 1 capsule (10 mg total) by mouth 1 day or 1 dose for  1 dose. 30 capsule 0  . azithromycin (ZITHROMAX) 250 MG tablet Take two tablets on day one then take one tablet once daily for an additional four days 6 tablet 0  . lubiprostone (AMITIZA) 24 MCG capsule Take 1 capsule (24 mcg total) by mouth 2 (two) times daily. 180 capsule 2   No facility-administered medications prior to visit.    Allergies  Allergen Reactions  . Neurontin [Gabapentin] Nausea And Vomiting and Other (See Comments)    Sleep walking/hallucinations   . Penicillins Hives, Shortness Of Breath and Other  (See Comments)    Has patient had a PCN reaction causing immediate rash, facial/tongue/throat swelling, SOB or lightheadedness with hypotension: Yes Has patient had a PCN reaction causing severe rash involving mucus membranes or skin necrosis: No Has patient had a PCN reaction that required hospitalization: No Has patient had a PCN reaction occurring within the last 10 years: Yes If all of the above answers are "NO", then may proceed with Cephalosporin use.  . Pregabalin Anaphylaxis, Shortness Of Breath, Diarrhea and Swelling    Lyrica  . Tramadol Other (See Comments)    Makes patient "delusional"  . Latex Rash    Review of Systems  Constitutional: Negative for fatigue and fever.  Respiratory: Positive for cough.        Asthma-albuterol 3 x day +symbicort Pt uses nebulizer with wheezing No recent PFT  Gastrointestinal:       GERD  Allergic/Immunologic: Positive for environmental allergies.       Allergy testing in past-cat causing eye swelling Hair dye       Objective:     Today's Vitals   02/01/20 0811  BP: 140/78  Pulse: 100  Temp: 97.8 F (36.6 C)  TempSrc: Temporal  SpO2: 96%  Weight: 189 lb 9.6 oz (86 kg)  Height: '5\' 6"'  (1.676 m)   Body mass index is 30.6 kg/m. Physical Exam Constitutional:      Appearance: Normal appearance.  HENT:     Head: Normocephalic and atraumatic.     Nose: Congestion and rhinorrhea present.     Mouth/Throat:     Mouth: Mucous membranes are moist.     Pharynx: Oropharynx is clear. No oropharyngeal exudate or posterior oropharyngeal erythema.  Cardiovascular:     Rate and Rhythm: Normal rate and regular rhythm.     Pulses: Normal pulses.     Heart sounds: Normal heart sounds.  Pulmonary:     Effort: Pulmonary effort is normal.     Breath sounds: Normal breath sounds.  Musculoskeletal:     Cervical back: Normal range of motion and neck supple.  Neurological:     Mental Status: She is alert.  Psychiatric:        Mood and  Affect: Mood normal.        Behavior: Behavior normal.     BP 140/78 (BP Location: Right Arm, Patient Position: Sitting, Cuff Size: Large)   Pulse 100   Temp 97.8 F (36.6 C) (Temporal)   Ht '5\' 6"'  (1.676 m)   Wt 189 lb 9.6 oz (86 kg)   SpO2 96%   BMI 30.60 kg/m  Wt Readings from Last 3 Encounters:  02/01/20 189 lb 9.6 oz (86 kg)  01/29/20 193 lb (87.5 kg)  01/26/20 191 lb 9.6 oz (86.9 kg)     Lab Results  Component Value Date   TSH 1.74 12/06/2019   Lab Results  Component Value Date   WBC 5.7 08/17/2019   HGB 12.3  08/17/2019   HCT 35.0 08/17/2019   MCV 89 08/17/2019   PLT 315 08/17/2019   Lab Results  Component Value Date   NA 138 12/06/2019   K 3.7 12/06/2019   CO2 26 12/06/2019   GLUCOSE 100 (H) 12/06/2019   BUN 13 12/06/2019   CREATININE 1.03 12/06/2019   BILITOT 0.3 12/06/2019   ALKPHOS 48 02/04/2019   AST 25 12/06/2019   ALT 32 (H) 12/06/2019   PROT 7.3 12/06/2019   ALBUMIN 4.1 02/04/2019   CALCIUM 9.7 12/06/2019   ANIONGAP 9 02/04/2019   GFR 87.40 04/23/2013   Lab Results  Component Value Date   CHOL 195 12/06/2019   Lab Results  Component Value Date   HDL 55 12/06/2019   Lab Results  Component Value Date   LDLCALC 117 (H) 12/06/2019   Lab Results  Component Value Date   TRIG 124 12/06/2019   Lab Results  Component Value Date   CHOLHDL 3.5 12/06/2019   Lab Results  Component Value Date   HGBA1C 6.4 (H) 01/04/2020       Assessment & Plan:   1. Cough variant asthma Symbicort, albuterol - Ambulatory referral to Allergy  2. Seasonal allergies Astelin/zyrtec/patanol Shayma Pfefferle Hannah Beat, MD

## 2020-02-01 NOTE — Patient Instructions (Addendum)
  Allergy/asthma referral-pt needs PFT for formal diagnosis and decisions on medication  Albuterol nebulizer first then symbicort in the morning Rinse mouth and brush the teeth  Use albuterol throughout the day if SOB/wheezing  Albuterol nebulizer in the evening then symbicort -rinse mouth and brush teeth  Continue taking singulair in the evening Continue using astelin nasal spray and start taking zyrtec tablet(stronger than claritin)-can cause drowsiness Continue patanol of eye irritation   If you have lab work done today you will be contacted with your lab results within the next 2 weeks.  If you have not heard from [place] then please contact us. The fastest way to get your results is to register for My Chart.   IF you received an x-ray today, you will receive an invoice from [facility]. Please contact [facility] at [phone] with questions or concerns regarding your invoice.   IF you received labwork today, you will receive an invoice from [facility]. Please contact [facility] at [phone] with questions or concerns regarding your invoice.   Our billing staff will not be able to assist you with questions regarding bills from these companies.  You will be contacted with the lab results as soon as they are available. The fastest way to get your results is to activate your My Chart account. Instructions are located on the last page of this paperwork. If you have not heard from [place] regarding the results in 2 weeks, please contact this office.

## 2020-02-02 ENCOUNTER — Ambulatory Visit: Payer: Medicare HMO | Admitting: Rheumatology

## 2020-02-02 DIAGNOSIS — R05 Cough: Secondary | ICD-10-CM | POA: Diagnosis not present

## 2020-02-02 DIAGNOSIS — Z20822 Contact with and (suspected) exposure to covid-19: Secondary | ICD-10-CM | POA: Diagnosis not present

## 2020-02-03 ENCOUNTER — Encounter: Payer: Self-pay | Admitting: Nutrition

## 2020-02-03 ENCOUNTER — Ambulatory Visit: Payer: Medicare HMO | Admitting: Allergy

## 2020-02-03 ENCOUNTER — Encounter: Payer: Self-pay | Admitting: Allergy

## 2020-02-03 ENCOUNTER — Other Ambulatory Visit: Payer: Self-pay

## 2020-02-03 DIAGNOSIS — Z20822 Contact with and (suspected) exposure to covid-19: Secondary | ICD-10-CM

## 2020-02-03 DIAGNOSIS — L282 Other prurigo: Secondary | ICD-10-CM

## 2020-02-03 DIAGNOSIS — J310 Chronic rhinitis: Secondary | ICD-10-CM

## 2020-02-03 DIAGNOSIS — J4550 Severe persistent asthma, uncomplicated: Secondary | ICD-10-CM | POA: Diagnosis not present

## 2020-02-03 MED ORDER — FLUTICASONE PROPIONATE 50 MCG/ACT NA SUSP: 1.0000 | Freq: Every day | NASAL | 2 refills | Status: DC

## 2020-02-03 NOTE — Progress Notes (Signed)
New Patient Note  RE: Jane English MRN: [id number] DOB: [birth year] Date of Office Visit: 02/03/2020  Referring provider: Maryruth Hancock, MD Primary care provider: Fayrene Helper, MD  Chief Complaint: Asthma and Cough  History of Present Illness: I had the pleasure of seeing Jane English for initial evaluation at the [facility] on 02/03/2020. She is a 49 y.o. female, who is referred here by Fayrene Helper, MD for the evaluation of asthma and allergies.  COVID-19 Patient had out of state guests visiting and apparently one of the guests had COVID-19 in the past but not recently. None of the guests had any symptoms but they were traveling from [place] to [place] to [place] to [place].   On Saturday, she noted anosmia while cleaning her home and then later on developed ageusia, headaches and rhinorrhea. Denies fevers or chills.  She noticed some worsening breathing symptoms.  Patient got concerned so she went to the ER on 01/29/20.  On 01/29/20 went to the ER, had negative Covid-19 testing.  She also noted some pruritic rash on her arms.   Asthma:  She reports symptoms of chest tightness, shortness of breath, coughing, wheezing, nocturnal awakenings for 30+ years. Current medications include Symbicort 160 2 puffs twice a day and albuterol nebulizer prn which help. She reports not using aerochamber with inhalers. She tried the following inhalers: Qvar. Main triggers are unknown. In the last month, frequency of symptoms: daily. Frequency of nocturnal symptoms: nightly. Frequency of SABA use: 2-3 times a day. Interference with physical activity: yes. Sleep is disturbed. In the last 12 months, emergency room visits/urgent care visits/doctor office visits or hospitalizations due to respiratory issues: 3-4 times per year. In the last 12 months, oral steroids courses: 1 in 2021. Lifetime history of hospitalization for respiratory issues: twice. Prior intubations: twice. History  of pneumonia: as an baby. She was evaluated by allergist/pulmonologist in the past. Smoking exposure: denies but husband smokes outdoors. Up to date with flu vaccine: denies.  History of reflux: yes.  Rhinitis: She reports symptoms of rhinorrhea, itchy/watery eyes, nasal congestion. Symptoms have been going on for 30 years. The symptoms are present all year around with worsening in summer. Other triggers include exposure to pollen. Anosmia: no. Headache: yes. She has used Singulair, zyrtec, Astelin, Patanol with minimal improvement in symptoms. Sinus infections: yes recently. Previous work up includes: not recently. Previous ENT evaluation: denies. Previous sinus imaging: no. History of nasal polyps: no. Last eye exam: 6 months ago.  Assessment and Plan: Jane English is a 48 y.o. female with: Suspected COVID-19 virus infection Anosmia, ageusia, headaches, rhinorrhea, rash since Saturday with exposure to out of state guests. COVID-19 testing on Saturday was negative. Denies fevers or chills and symptoms slowly improving.  Discussed with patient that her symptoms are worrisome for COVID-19 and she may have gotten the initial COVID-19 testing too soon.  Get re-tested for COVID-19.   Quarantine yourself for 10 days from your first onset of symptoms.  If you have acute worsening symptoms please go to the ER/urgent care.   Severe persistent asthma without complication Diagnosed with asthma over 30 years ago and using Symbicort 160 2 puffs twice a day and albuterol a few times a day. Patient has history of hospitalization with intubation in the past.   Due to concern about suspected COVID-19 infection today, will do spirometry at next visit.  . Start prednisone taper.  . Use your nebulizer twice a day -  before using Symbicort for the next 1 week. Then may use every 4 to 6 hours as needed.  . Daily controller medication(s): Continue Symbicort 160 2 puffs twice a day with spacer and rinse mouth  afterwards. . Prior to physical activity: May use albuterol rescue inhaler 2 puffs 5 to 15 minutes prior to strenuous physical activities. Marland Kitchen Rescue medications: May use albuterol rescue inhaler 2 puffs or nebulizer every 4 to 6 hours as needed for shortness of breath, chest tightness, coughing, and wheezing. Monitor frequency of use.  . Consider adding LAMA inhaler next.   Chronic rhinitis Perennial rhino conjunctivitis symptoms for 30+ years with worsening in the summer. Tried Singulair, zyrtec, Astelin, Patanol with minimal improvement in symptoms. No recent allergy skin testing.  Unable to skin test today due to recent antihistamine intake.  Continue Singulair (montelukast) 42m daily.  Continue Astelin nasal spray 2 sprays per nostril 1-2 time a day as needed for runny nose.  Start Fluticasone 1-2 sprays per nostril daily for nasal congestion.   May use olopatadine 0.1% eye drop 1 drop in each eye twice daily as needed for itchy/watery eyes.   Continue zyrtec 126mdaily - hold 3 days before next appointment for skin testing.   Pruritic rash New onset pruritic rash.   The prednisone should help clear this up.  Take pictures of the rash if it flares.   Gave handout on proper skin care.   Return in about 2 weeks (around 02/17/2020) for Skin testing.  Meds ordered this encounter  Medications  . fluticasone (FLONASE) 50 MCG/ACT nasal spray    Sig: Place 1-2 sprays into both nostrils daily.    Dispense:  16 g    Refill:  2   Other allergy screening: Food allergy: no Medication allergy: yes Hymenoptera allergy: yes  Shortness of breath with yellow jackets.  Urticaria: yes Eczema:no  Diagnostics: Skin Testing: Deferred due to recent antihistamines use.  Past Medical History: Patient Active Problem List   Diagnosis Date Noted  . Suspected COVID-19 virus infection 02/03/2020  . Severe persistent asthma without complication 0415/94/5859. Chronic rhinitis 02/03/2020  .  Pruritic rash 02/03/2020  . Acute sinusitis 01/02/2020  . Urinary urgency 08/11/2019  . GAD (generalized anxiety disorder) 04/26/2019  . Depression, major, single episode, severe ([facility]/30/2019  . Vitamin D deficiency 12/14/2017  . Bilateral ankle pain 04/16/2017  . Amenorrhea 10/15/2016  . Paresthesia 05/06/2016  . Back pain with radiation 04/04/2016  . Obesity (BMI 30.0-34.9) 03/25/2016  . Neuropathic pain of finger of right hand 12/31/2015  . Pap smear of cervix shows high risk HPV present 08/08/2015  . Cyst of left ovary 02/06/2015  . Anovulation 02/06/2015  . Secondary amenorrhea 02/06/2015  . Intractable chronic migraine without aura and without status migrainosus 12/21/2014  . Migraine 09/21/2014  . Anemia 10/11/2013  . H. pylori duodenitis 07/07/2013  . PTSD (post-traumatic stress disorder) 03/26/2013  . Insomnia 03/18/2013  . Seasonal allergies 12/10/2012  . GERD (gastroesophageal reflux disease) 10/29/2012  . Chronic pain syndrome 10/06/2012  . Postlaminectomy syndrome 10/06/2012  . Cervical neck pain with evidence of disc disease 04/13/2012  . Allergic rhinitis 04/17/2010  . Cough variant asthma 03/04/2010  . Prediabetes 01/05/2009  . Essential hypertension 11/27/2007   Past Medical History:  Diagnosis Date  . Anemia   . Asthma   . Asthma flare 04/09/2013  . Back pain   . Bronchitis   . Chronic abdominal pain   . Chronic constipation   .  Constipation due to opioid therapy   . Depression   . Diabetes mellitus without complication ([place])   . Diabetes mellitus, type II ([place])   . DVT (deep venous thrombosis) ([place]) 2010  . GERD (gastroesophageal reflux disease)   . Heart murmur    no cardiologist  . Helicobacter pylori gastritis 06/11/2013   Colonoscopy Dr. Hilarie Fredrickson  . Hypertension   . IBS (irritable bowel syndrome)   . Migraine headache   . Neuropathy   . Obesity   . Obsessive-compulsive disorder   . PSYCHOTIC D/O W/HALLUCINATIONS CONDS CLASS ELSW 03/04/2010     Qualifier: Diagnosis of  By: Moshe Cipro MD, Joycelyn Schmid    . PTSD (post-traumatic stress disorder)   . SBO (small bowel obstruction) ([place]) 08/09/2013  . Seasonal allergies 12/10/2012  . Seizures ([place])   . Shortness of breath    Past Surgical History: Past Surgical History:  Procedure Laterality Date  . ANTERIOR CERVICAL DECOMP/DISCECTOMY FUSION  07/07/2012   Procedure: ANTERIOR CERVICAL DECOMPRESSION/DISCECTOMY FUSION 2 LEVELS;  Surgeon: Floyce Stakes, MD;  Location: [facility];  Service: Neurosurgery;  Laterality: N/A;  Cervical four-five, five - six  Anterior cervical decompression/diskectomy/fusion/plate  . APPENDECTOMY  1986  . BOWEL RESECTION N/A 07/29/2013   Procedure: serosal repair;  Surgeon: Adin Hector, MD;  Location: [facility];  Service: General;  Laterality: N/A;  . CARPAL TUNNEL RELEASE Bilateral   . LAPAROSCOPY N/A 07/29/2013   Procedure: diagnostic laporoscopy;  Surgeon: Adin Hector, MD;  Location: [facility];  Service: General;  Laterality: N/A;  . LAPAROSCOPY N/A 08/16/2013   Procedure: LAPAROSCOPY DIAGNOSTIC/LYSIS OF ADHESIONS;  Surgeon: Adin Hector, MD;  Location: [facility];  Service: General;  Laterality: N/A;  . LAPAROTOMY N/A 08/16/2013   Procedure: EXPLORATORY LAPAROTOMY/SMALL BOWEL RESECTION ([facility]);  Surgeon: Adin Hector, MD;  Location: [facility];  Service: General;  Laterality: N/A;  . LUMBAR SPINE SURGERY  2010   x 3  . LYSIS OF ADHESION  2003   Dr. Irving Shows  . LYSIS OF ADHESION N/A 07/29/2013   Procedure: LYSIS OF ADHESION;  Surgeon: Adin Hector, MD;  Location: [facility];  Service: General;  Laterality: N/A;  . OOPHORECTOMY    . PARTIAL HYSTERECTOMY  1990s?   [name], [place]  . SPINAL CORD STIMULATOR IMPLANT    . TRIGGER FINGER RELEASE  2009   right pinkie finger  . TUBAL LIGATION  1994   Medication List:  Current Outpatient Medications  Medication Sig Dispense Refill  . albuterol (PROVENTIL) (2.5 MG/3ML) 0.083% nebulizer solution Take 3 mLs (2.5  mg total) by nebulization every 6 (six) hours as needed for wheezing or shortness of breath. 150 mL 1  . albuterol (VENTOLIN HFA) 108 (90 Base) MCG/ACT inhaler TAKE 2 PUFFS BY MOUTH EVERY 6 HOURS AS NEEDED FOR WHEEZE OR SHORTNESS OF BREATH 18 g 4  . ALPRAZolam (XANAX) 1 MG tablet Take one tablet by mouth once daily for anxiety (Patient taking differently: Take 0.5 mg by mouth daily. Take one tablet by mouth once daily for anxiety) 30 tablet 5  . amitriptyline (ELAVIL) 150 MG tablet Take 0.5 tablets (75 mg total) by mouth at bedtime. 30 tablet 1  . azelastine (ASTELIN) 0.1 % nasal spray Place 2 sprays into both nostrils 2 (two) times daily. Use in each nostril as directed 30 mL 12  . budesonide-formoterol (SYMBICORT) 160-4.5 MCG/ACT inhaler Inhale 2 puffs into the lungs 2 (two) times daily. PRN 1 Inhaler 3  . cetirizine (ZYRTEC) 10  MG tablet Take 1 tablet (10 mg total) by mouth daily. 30 tablet 11  . cloNIDine (CATAPRES) 0.1 MG tablet TAKE 1 TABLET BY MOUTH EVERYDAY AT BEDTIME 90 tablet 1  . clotrimazole-betamethasone (LOTRISONE) cream APPLY TO AFFECTED AREA TWICE A DAY 45 g 5  . ergocalciferol (VITAMIN D2) 1.25 MG (50000 UT) capsule Take 1 capsule (50,000 Units total) by mouth once a week. One capsule once weekly 12 capsule 1  . erythromycin ophthalmic ointment APPLY (1CM) RIBBON INTO THE LOWER CONJUNCTIVAL SAC(S) IN THE AFFECTED EYE(S) 3 TIMES A DAY    . LINZESS 290 MCG CAPS capsule TAKE 1 CAPSULE (290 MCG TOTAL) BY MOUTH DAILY BEFORE BREAKFAST. 90 capsule 2  . medroxyPROGESTERone (PROVERA) 10 MG tablet TAKE 1 TABLET BY MOUTH DAILY FOR 2 WEEKS. REPEAT AS DIRECTED BY PHYSICIAN EVERY 3 MONTHS    . montelukast (SINGULAIR) 10 MG tablet TAKE 1 TABLET BY MOUTH EVERYDAY AT BEDTIME 90 tablet 0  . NARCAN 4 MG/0.1ML LIQD nasal spray kit Place 4 mg into the nose as directed.  1  . NONFORMULARY OR COMPOUNDED ITEM Apply 1-2 pumps, 3-4 times daily as needed. 1 each 0  . olopatadine (PATANOL) 0.1 % ophthalmic  solution Place 1 drop into both eyes 2 (two) times daily. 5 mL 12  . oxyCODONE (ROXICODONE) 5 MG immediate release tablet Take 2 tablets (10 mg total) by mouth 2 (two) times daily. 120 tablet 0  . pantoprazole (PROTONIX) 40 MG tablet TAKE 1 TABLET BY MOUTH EVERY DAY 90 tablet 2  . phentermine (ADIPEX-P) 37.5 MG tablet Take 1 tablet (37.5 mg total) by mouth daily before breakfast. Take one half tablet once daily at breakfast for appetite suppression 30 tablet 1  . potassium chloride 20 MEQ/15ML (10%) SOLN Take 15 mLs (20 mEq total) by mouth 2 (two) times daily. 473 mL 1  . solifenacin (VESICARE) 5 MG tablet Take 1 tablet (5 mg total) by mouth daily. 30 tablet 3  . triamterene-hydrochlorothiazide (MAXZIDE-25) 37.5-25 MG tablet TAKE 1 TABLET BY MOUTH EVERY DAY (Patient taking differently: 0.5 tablets. TAKE 1 TABLET BY MOUTH EVERY DAY) 90 tablet 1  . trolamine salicylate (ASPERCREME) 10 % cream Apply 1 application topically as needed for muscle pain.    . beclomethasone (QVAR REDIHALER) 40 MCG/ACT inhaler Inhale 2 puffs into the lungs 2 (two) times daily. (Patient not taking: Reported on 02/03/2020) 10.6 g 5  . diclofenac Sodium (VOLTAREN) 1 % GEL Apply topically 4 (four) times daily.    . fluticasone (FLONASE) 50 MCG/ACT nasal spray Place 1-2 sprays into both nostrils daily. 16 g 2   No current facility-administered medications for this visit.   Allergies: Allergies  Allergen Reactions  . Neurontin [Gabapentin] Nausea And Vomiting and Other (See Comments)    Sleep walking/hallucinations   . Penicillins Hives, Shortness Of Breath and Other (See Comments)    Has patient had a PCN reaction causing immediate rash, facial/tongue/throat swelling, SOB or lightheadedness with hypotension: Yes Has patient had a PCN reaction causing severe rash involving mucus membranes or skin necrosis: No Has patient had a PCN reaction that required hospitalization: No Has patient had a PCN reaction occurring within the  last 10 years: Yes If all of the above answers are "NO", then may proceed with Cephalosporin use.  . Pregabalin Anaphylaxis, Shortness Of Breath, Diarrhea and Swelling    Lyrica  . Tramadol Other (See Comments)    Makes patient "delusional"  . Latex Rash   Social History: Social History  Socioeconomic History  . Marital status: Married    Spouse name: Not on file  . Number of children: 4  . Years of education: Not on file  . Highest education level: Not on file  Occupational History  . Not on file  Tobacco Use  . Smoking status: Never Smoker  . Smokeless tobacco: Never Used  Substance and Sexual Activity  . Alcohol use: No  . Drug use: No  . Sexual activity: Yes    Partners: Male    Birth control/protection: Surgical    Comment: tubal  Other Topics Concern  . Not on file  Social History Narrative  . Not on file   Social Determinants of Health   Financial Resource Strain:   . Difficulty of Paying Living Expenses:   Food Insecurity:   . Worried About Charity fundraiser in the Last Year:   . Arboriculturist in the Last Year:   Transportation Needs:   . Film/video editor (Medical):   Marland Kitchen Lack of Transportation (Non-Medical):   Physical Activity:   . Days of Exercise per Week:   . Minutes of Exercise per Session:   Stress:   . Feeling of Stress :   Social Connections:   . Frequency of Communication with Friends and Family:   . Frequency of Social Gatherings with Friends and Family:   . Attends Religious Services:   . Active Member of Clubs or Organizations:   . Attends Archivist Meetings:   Marland Kitchen Marital Status:    Lives in a 51 year old apartment. Smoking: denies Occupation: Pharmacologist HistoryFreight forwarder in the house: no Charity fundraiser in the family room: yes Carpet in the bedroom: no Heating: electric Cooling: central Pet: yes 1 dog x 11 yrs  Family History: Family History  Problem Relation Age of Onset  . Lung  cancer Father   . Stomach cancer Father   . Esophageal cancer Father   . Alcohol abuse Father   . Mental illness Father   . Diabetes Sister   . Hypertension Sister   . Bipolar disorder Sister   . Schizophrenia Sister   . Diabetes Sister   . Alcohol abuse Brother   . Hypertension Brother   . Kidney disease Brother   . Diabetes Brother   . Drug abuse Brother   . Mental illness Brother   . Alcohol abuse Brother   . Alcohol abuse Brother   . Hypertension Brother   . Diabetes Brother   . Alcohol abuse Brother   . Physical abuse Mother   . Alcohol abuse Mother   . Cirrhosis Mother   . Mental illness Brother        in [place]  . Drug abuse Sister   . HIV Sister   . Alcohol abuse Brother   . Pneumonia Sister        died as a baby  . Alcohol abuse Brother   . Bipolar disorder Brother   . Bipolar disorder Daughter   . Bipolar disorder Son   . Bipolar disorder Son   . Hypertension Brother   . Bipolar disorder Brother   . Drug abuse Brother   . Alcohol abuse Brother   . Bipolar disorder Brother   . ADD / ADHD Neg Hx   . Anxiety disorder Neg Hx   . Dementia Neg Hx   . Depression Neg Hx   . OCD Neg Hx   . Seizures Neg Hx   .  Paranoid behavior Neg Hx   . Colon cancer Neg Hx    Problem                               Relation Asthma                                   Son, daughter  Allergic rhino conjunctivitis     Son, daughter   Review of Systems  Constitutional: Negative for appetite change, chills, fever and unexpected weight change.  HENT: Negative for congestion and rhinorrhea.   Eyes: Negative for itching.  Respiratory: Positive for cough. Negative for chest tightness, shortness of breath and wheezing.   Cardiovascular: Negative for chest pain.  Gastrointestinal: Negative for abdominal pain.  Genitourinary: Negative for difficulty urinating.  Skin: Positive for rash.  Allergic/Immunologic: Negative for food allergies.  Neurological: Positive for headaches.    Objective: There were no vitals taken for this visit. There is no height or weight on file to calculate BMI. Physical Exam  Constitutional: She is oriented to person, place, and time. She appears well-developed and well-nourished.  HENT:  Head: Normocephalic and atraumatic.  Right Ear: External ear normal.  Left Ear: External ear normal.  Nose: Nose normal.  Mouth/Throat: Oropharynx is clear and moist.  Eyes: Conjunctivae and EOM are normal.  Cardiovascular: Normal rate, regular rhythm and normal heart sounds. Exam reveals no gallop and no friction rub.  No murmur heard. Pulmonary/Chest: Effort normal and breath sounds normal. She has no wheezes. She has no rales.  Abdominal: Soft.  Musculoskeletal:     Cervical back: Neck supple.  Neurological: She is alert and oriented to person, place, and time.  Skin: Skin is warm. Rash noted.  Scattered papular rash on upper extremities with excoriation mark in the middle.   Psychiatric: She has a normal mood and affect. Her behavior is normal.  Nursing note and vitals reviewed.  The plan was reviewed with the patient/family, and all questions/concerned were addressed.  It was my pleasure to see Jane English today and participate in her care. Please feel free to contact me with any questions or concerns.  Sincerely,  Rexene Alberts, DO Allergy & Immunology  [facility] office: [phone] [facility] office: [place] office: [phone]

## 2020-02-03 NOTE — Assessment & Plan Note (Addendum)
New onset pruritic rash.   The prednisone should help clear this up.  Take pictures of the rash if it flares.   Gave handout on proper skin care.

## 2020-02-03 NOTE — Assessment & Plan Note (Signed)
Anosmia, ageusia, headaches, rhinorrhea, rash since Saturday with exposure to out of state guests. COVID-19 testing on Saturday was negative. Denies fevers or chills and symptoms slowly improving.  Discussed with patient that her symptoms are worrisome for COVID-19 and she may have gotten the initial COVID-19 testing too soon.  Get re-tested for COVID-19.   Quarantine yourself for 10 days from your first onset of symptoms.  If you have acute worsening symptoms please go to the ER/urgent care.

## 2020-02-03 NOTE — Patient Instructions (Addendum)
Get re-tested for COVID-19.  Text covid to: [id number] yourself for 10 days from your first onset of symptoms.  If you have acute worsening symptoms please go to the ER/urgent care.   Asthma: . Start prednisone taper.  . Use your nebulizer twice a day - before using Symbicort for the next 1 week. Then may use every 4 to 6 hours as needed.  . Daily controller medication(s): Continue Symbicort 160 2 puffs twice a day with spacer and rinse mouth afterwards. . Prior to physical activity: May use albuterol rescue inhaler 2 puffs 5 to 15 minutes prior to strenuous physical activities. Marland Kitchen Rescue medications: May use albuterol rescue inhaler 2 puffs or nebulizer every 4 to 6 hours as needed for shortness of breath, chest tightness, coughing, and wheezing. Monitor frequency of use.  . Asthma control goals:  o Full participation in all desired activities (may need albuterol before activity) o Albuterol use two times or less a week on average (not counting use with activity) o Cough interfering with sleep two times or less a month o Oral steroids no more than once a year o No hospitalizations  Rhinitis:  Continue Singulair (montelukast) 10mg  daily.  Continue Astelin nasal spray 2 sprays per nostril 1-2 time a day as needed for runny nose.  Start Fluticasone 1-2 sprays per nostril daily for nasal congestion.   May use olopatadine eye drop 1 drop in each eye twice daily as needed for itchy/watery eyes.   Continue zyrtec 10mg  daily.  Rash:  The prednisone should help clear this up.  Take pictures of the rash.  See proper skin care below.  Follow up in 2 weeks for skin testing and breathing test - you must stop the zyrtec 3 days before. You can take all your other medications as prescribed.    Skin care recommendations  Bath time: . Always use lukewarm water. AVOID very hot or cold water. Marland Kitchen Keep bathing time to 5-10 minutes. . Do NOT use bubble bath. . Use a mild soap and  use just enough to wash the dirty areas. . Do NOT scrub skin vigorously.  . After bathing, pat dry your skin with a towel. Do NOT rub or scrub the skin.  Moisturizers and prescriptions:  . ALWAYS apply moisturizers immediately after bathing (within 3 minutes). This helps to lock-in moisture. . Use the moisturizer several times a day over the whole body. Kermit Balo summer moisturizers include: Aveeno, CeraVe, Cetaphil. Kermit Balo winter moisturizers include: Aquaphor, Vaseline, Cerave, Cetaphil, Eucerin, Vanicream. . When using moisturizers along with medications, the moisturizer should be applied about one hour after applying the medication to prevent diluting effect of the medication or moisturize around where you applied the medications. When not using medications, the moisturizer can be continued twice daily as maintenance.  Laundry and clothing: . Avoid laundry products with added color or perfumes. . Use unscented hypo-allergenic laundry products such as Tide free, Cheer free & gentle, and All free and clear.  . If the skin still seems dry or sensitive, you can try double-rinsing the clothes. . Avoid tight or scratchy clothing such as wool. . Do not use fabric softeners or dyer sheets.

## 2020-02-03 NOTE — Assessment & Plan Note (Signed)
Perennial rhino conjunctivitis symptoms for 30+ years with worsening in the summer. Tried Singulair, zyrtec, Astelin, Patanol with minimal improvement in symptoms. No recent allergy skin testing.  Unable to skin test today due to recent antihistamine intake.  Continue Singulair (montelukast) 10mg  daily.  Continue Astelin nasal spray 2 sprays per nostril 1-2 time a day as needed for runny nose.  Start Fluticasone 1-2 sprays per nostril daily for nasal congestion.   May use olopatadine 0.1% eye drop 1 drop in each eye twice daily as needed for itchy/watery eyes.   Continue zyrtec 10mg  daily - hold 3 days before next appointment for skin testing.

## 2020-02-03 NOTE — Assessment & Plan Note (Addendum)
Diagnosed with asthma over 30 years ago and using Symbicort 160 2 puffs twice a day and albuterol a few times a day. Patient has history of hospitalization with intubation in the past.   Due to concern about suspected COVID-19 infection today, will do spirometry at next visit.  . Start prednisone taper.  . Use your nebulizer twice a day - before using Symbicort for the next 1 week. Then may use every 4 to 6 hours as needed.  . Daily controller medication(s): Continue Symbicort 160 2 puffs twice a day with spacer and rinse mouth afterwards. . Prior to physical activity: May use albuterol rescue inhaler 2 puffs 5 to 15 minutes prior to strenuous physical activities. Marland Kitchen Rescue medications: May use albuterol rescue inhaler 2 puffs or nebulizer every 4 to 6 hours as needed for shortness of breath, chest tightness, coughing, and wheezing. Monitor frequency of use.  . Consider adding LAMA inhaler next.

## 2020-02-07 ENCOUNTER — Ambulatory Visit: Payer: Medicare HMO | Attending: Internal Medicine

## 2020-02-07 ENCOUNTER — Other Ambulatory Visit: Payer: Self-pay

## 2020-02-07 DIAGNOSIS — Z20822 Contact with and (suspected) exposure to covid-19: Secondary | ICD-10-CM

## 2020-02-08 LAB — SARS-COV-2, NAA 2 DAY TAT

## 2020-02-08 LAB — NOVEL CORONAVIRUS, NAA: SARS-CoV-2, NAA: NOT DETECTED

## 2020-02-09 ENCOUNTER — Ambulatory Visit: Payer: Medicare HMO | Admitting: Physical Therapy

## 2020-02-10 ENCOUNTER — Telehealth: Payer: Self-pay

## 2020-02-10 NOTE — Telephone Encounter (Signed)
Pt needs nebulizer cords , mask, and handheld device sent to [facility]

## 2020-02-11 MED ORDER — UNABLE TO FIND: 0 refills | Status: DC

## 2020-02-11 NOTE — Addendum Note (Signed)
Addended by: Eual Fines on: 02/11/2020 09:42 AM   Modules accepted: Orders

## 2020-02-17 ENCOUNTER — Ambulatory Visit: Payer: Medicare HMO | Admitting: Allergy

## 2020-02-17 NOTE — Progress Notes (Deleted)
Follow Up Note  RE: DACI STUBBE MRN: [id number] DOB: 26-Nov-1970 Date of Office Visit: 02/17/2020  Referring provider: Fayrene Helper, MD Primary care provider: Fayrene Helper, MD  Chief Complaint: No chief complaint on file.  History of Present Illness: I had the pleasure of seeing [facility] for a follow up visit at the [facility] [place] [facility] [place] on 02/17/2020. She is a 49 y.o. female, who is being followed for asthma, rhinitis and pruritic rash. Her previous allergy office visit was on 02/03/2020 with Dr. Maudie Mercury. Today is a Skin testing and office visit.  Suspected COVID-19 virus infection Anosmia, ageusia, headaches, rhinorrhea, rash since Saturday with exposure to out of state guests. COVID-19 testing on Saturday was negative. Denies fevers or chills and symptoms slowly improving.  Discussed with patient that her symptoms are worrisome for COVID-19 and she may have gotten the initial COVID-19 testing too soon.  Get re-tested for COVID-19.   Quarantine yourself for 10 days from your first onset of symptoms.  If you have acute worsening symptoms please go to the ER/urgent care.   Severe persistent asthma without complication Diagnosed with asthma over 30 years ago and using Symbicort 160 2 puffs twice a day and albuterol a few times a day. Patient has history of hospitalization with intubation in the past.   Due to concern about suspected COVID-19 infection today, will do spirometry at next visit.   Start prednisone taper.   Use your nebulizer twice a day - before using Symbicort for the next 1 week. Then may use every 4 to 6 hours as needed.   Daily controller medication(s):Continue Symbicort 160 2 puffs twice a day with spacer and rinse mouth afterwards.  Prior to physical activity:May use albuterol rescue inhaler 2 puffs 5 to 15 minutes prior to strenuous physical activities.  Rescue medications:May use albuterol rescue inhaler 2 puffs or nebulizer every 4  to 6 hours as needed for shortness of breath, chest tightness, coughing, and wheezing. Monitor frequency of use.   Consider adding LAMA inhaler next.   Chronic rhinitis Perennial rhino conjunctivitis symptoms for 30+ years with worsening in the summer. Tried Singulair, zyrtec, Astelin, Patanol with minimal improvement in symptoms. No recent allergy skin testing.  Unable to skin test today due to recent antihistamine intake.  Continue Singulair (montelukast) 36m daily.  Continue Astelin nasal spray 2 sprays per nostril 1-2 time a day as needed for runny nose.  Start Fluticasone 1-2 sprays per nostril daily for nasal congestion.   May use olopatadine 0.1% eye drop 1 drop in each eye twice daily as needed for itchy/watery eyes.   Continue zyrtec 165mdaily - hold 3 days before next appointment for skin testing.   Pruritic rash New onset pruritic rash.   The prednisone should help clear this up.  Take pictures of the rash if it flares.   Gave handout on proper skin care.   Return in about 2 weeks (around 02/17/2020) for Skin testing.  Assessment and Plan: AvNardas a [age].o. female with: No problem-specific Assessment & Plan notes found for this encounter.  No follow-ups on file.  No orders of the defined types were placed in this encounter.  Lab Orders  No laboratory test(s) ordered today    Diagnostics: Spirometry:  Tracings reviewed. Her effort: {Blank single:19197::"Good reproducible efforts.","It was hard to get consistent efforts and there is a question as to whether this reflects a maximal maneuver.","Poor effort, data can not be interpreted."} FVC: ***  L FEV1: ***L, ***% predicted FEV1/FVC ratio: ***% Interpretation: {Blank single:19197::"Spirometry consistent with mild obstructive disease","Spirometry consistent with moderate obstructive disease","Spirometry consistent with severe obstructive disease","Spirometry consistent with possible restrictive  disease","Spirometry consistent with mixed obstructive and restrictive disease","Spirometry uninterpretable due to technique","Spirometry consistent with normal pattern","No overt abnormalities noted given today's efforts"}.  Please see scanned spirometry results for details.  Skin Testing: {Blank single:19197::"Select foods","Environmental allergy panel","Environmental allergy panel and select foods","Food allergy panel","None","Deferred due to recent antihistamines use"}. Positive test to: ***. Negative test to: ***.  Results discussed with patient/family.   Medication List:  Current Outpatient Medications  Medication Sig Dispense Refill  . albuterol (PROVENTIL) (2.5 MG/3ML) 0.083% nebulizer solution Take 3 mLs (2.5 mg total) by nebulization every 6 (six) hours as needed for wheezing or shortness of breath. 150 mL 1  . albuterol (VENTOLIN HFA) 108 (90 Base) MCG/ACT inhaler TAKE 2 PUFFS BY MOUTH EVERY 6 HOURS AS NEEDED FOR WHEEZE OR SHORTNESS OF BREATH 18 g 4  . ALPRAZolam (XANAX) 1 MG tablet Take one tablet by mouth once daily for anxiety (Patient taking differently: Take 0.5 mg by mouth daily. Take one tablet by mouth once daily for anxiety) 30 tablet 5  . amitriptyline (ELAVIL) 150 MG tablet Take 0.5 tablets (75 mg total) by mouth at bedtime. 30 tablet 1  . azelastine (ASTELIN) 0.1 % nasal spray Place 2 sprays into both nostrils 2 (two) times daily. Use in each nostril as directed 30 mL 12  . beclomethasone (QVAR REDIHALER) 40 MCG/ACT inhaler Inhale 2 puffs into the lungs 2 (two) times daily. (Patient not taking: Reported on 02/03/2020) 10.6 g 5  . budesonide-formoterol (SYMBICORT) 160-4.5 MCG/ACT inhaler Inhale 2 puffs into the lungs 2 (two) times daily. PRN 1 Inhaler 3  . cetirizine (ZYRTEC) 10 MG tablet Take 1 tablet (10 mg total) by mouth daily. 30 tablet 11  . cloNIDine (CATAPRES) 0.1 MG tablet TAKE 1 TABLET BY MOUTH EVERYDAY AT BEDTIME 90 tablet 1  . clotrimazole-betamethasone (LOTRISONE)  cream APPLY TO AFFECTED AREA TWICE A DAY 45 g 5  . diclofenac Sodium (VOLTAREN) 1 % GEL Apply topically 4 (four) times daily.    . ergocalciferol (VITAMIN D2) 1.25 MG (50000 UT) capsule Take 1 capsule (50,000 Units total) by mouth once a week. One capsule once weekly 12 capsule 1  . erythromycin ophthalmic ointment APPLY (1CM) RIBBON INTO THE LOWER CONJUNCTIVAL SAC(S) IN THE AFFECTED EYE(S) 3 TIMES A DAY    . fluticasone (FLONASE) 50 MCG/ACT nasal spray Place 1-2 sprays into both nostrils daily. 16 g 2  . LINZESS 290 MCG CAPS capsule TAKE 1 CAPSULE (290 MCG TOTAL) BY MOUTH DAILY BEFORE BREAKFAST. 90 capsule 2  . medroxyPROGESTERone (PROVERA) 10 MG tablet TAKE 1 TABLET BY MOUTH DAILY FOR 2 WEEKS. REPEAT AS DIRECTED BY PHYSICIAN EVERY 3 MONTHS    . montelukast (SINGULAIR) 10 MG tablet TAKE 1 TABLET BY MOUTH EVERYDAY AT BEDTIME 90 tablet 0  . NARCAN 4 MG/0.1ML LIQD nasal spray kit Place 4 mg into the nose as directed.  1  . NONFORMULARY OR COMPOUNDED ITEM Apply 1-2 pumps, 3-4 times daily as needed. 1 each 0  . olopatadine (PATANOL) 0.1 % ophthalmic solution Place 1 drop into both eyes 2 (two) times daily. 5 mL 12  . oxyCODONE (ROXICODONE) 5 MG immediate release tablet Take 2 tablets (10 mg total) by mouth 2 (two) times daily. 120 tablet 0  . pantoprazole (PROTONIX) 40 MG tablet TAKE 1 TABLET BY MOUTH EVERY DAY 90 tablet  2  . phentermine (ADIPEX-P) 37.5 MG tablet Take 1 tablet (37.5 mg total) by mouth daily before breakfast. Take one half tablet once daily at breakfast for appetite suppression 30 tablet 1  . potassium chloride 20 MEQ/15ML (10%) SOLN Take 15 mLs (20 mEq total) by mouth 2 (two) times daily. 473 mL 1  . solifenacin (VESICARE) 5 MG tablet Take 1 tablet (5 mg total) by mouth daily. 30 tablet 3  . triamterene-hydrochlorothiazide (MAXZIDE-25) 37.5-25 MG tablet TAKE 1 TABLET BY MOUTH EVERY DAY (Patient taking differently: 0.5 tablets. TAKE 1 TABLET BY MOUTH EVERY DAY) 90 tablet 1  . trolamine  salicylate (ASPERCREME) 10 % cream Apply 1 application topically as needed for muscle pain.    Marland Kitchen UNABLE TO FIND Nebulizer mouth piece and tubing  Dx J44.991 1 each 0   No current facility-administered medications for this visit.   Allergies: Allergies  Allergen Reactions  . Neurontin [Gabapentin] Nausea And Vomiting and Other (See Comments)    Sleep walking/hallucinations   . Penicillins Hives, Shortness Of Breath and Other (See Comments)    Has patient had a PCN reaction causing immediate rash, facial/tongue/throat swelling, SOB or lightheadedness with hypotension: Yes Has patient had a PCN reaction causing severe rash involving mucus membranes or skin necrosis: No Has patient had a PCN reaction that required hospitalization: No Has patient had a PCN reaction occurring within the last 10 years: Yes If all of the above answers are "NO", then may proceed with Cephalosporin use.  . Pregabalin Anaphylaxis, Shortness Of Breath, Diarrhea and Swelling    Lyrica  . Tramadol Other (See Comments)    Makes patient "delusional"  . Latex Rash   I reviewed her past medical history, social history, family history, and environmental history and no significant changes have been reported from her previous visit.  Review of Systems  Constitutional: Negative for appetite change, chills, fever and unexpected weight change.  HENT: Negative for congestion and rhinorrhea.   Eyes: Negative for itching.  Respiratory: Positive for cough. Negative for chest tightness, shortness of breath and wheezing.   Cardiovascular: Negative for chest pain.  Gastrointestinal: Negative for abdominal pain.  Genitourinary: Negative for difficulty urinating.  Skin: Positive for rash.  Allergic/Immunologic: Negative for food allergies.  Neurological: Positive for headaches.   Objective: There were no vitals taken for this visit. There is no height or weight on file to calculate BMI. Physical Exam  Constitutional: She is  oriented to person, place, and time. She appears well-developed and well-nourished.  HENT:  Head: Normocephalic and atraumatic.  Right Ear: External ear normal.  Left Ear: External ear normal.  Nose: Nose normal.  Mouth/Throat: Oropharynx is clear and moist.  Eyes: Conjunctivae and EOM are normal.  Cardiovascular: Normal rate, regular rhythm and normal heart sounds. Exam reveals no gallop and no friction rub.  No murmur heard. Pulmonary/Chest: Effort normal and breath sounds normal. She has no wheezes. She has no rales.  Abdominal: Soft.  Musculoskeletal:     Cervical back: Neck supple.  Neurological: She is alert and oriented to person, place, and time.  Skin: Skin is warm. Rash noted.  Scattered papular rash on upper extremities with excoriation mark in the middle.   Psychiatric: She has a normal mood and affect. Her behavior is normal.  Nursing note and vitals reviewed.  Previous notes and tests were reviewed. The plan was reviewed with the patient/family, and all questions/concerned were addressed.  It was my pleasure to see Jane English today and participate  in her care. Please feel free to contact me with any questions or concerns.  Sincerely,  Rexene Alberts, DO Allergy & Immunology  [facility] [place] office: [phone] [facility] office: [place] office: [phone]

## 2020-02-18 ENCOUNTER — Encounter: Payer: Medicare HMO | Attending: Physical Medicine and Rehabilitation | Admitting: Physical Medicine and Rehabilitation

## 2020-02-18 ENCOUNTER — Other Ambulatory Visit: Payer: Self-pay

## 2020-02-18 ENCOUNTER — Encounter: Payer: Self-pay | Admitting: Physical Medicine and Rehabilitation

## 2020-02-18 VITALS — BP 131/85 | HR 102 | Temp 97.5°F | Ht 66.0 in | Wt 183.2 lb

## 2020-02-18 DIAGNOSIS — Z79891 Long term (current) use of opiate analgesic: Secondary | ICD-10-CM | POA: Insufficient documentation

## 2020-02-18 DIAGNOSIS — Z5181 Encounter for therapeutic drug level monitoring: Secondary | ICD-10-CM | POA: Insufficient documentation

## 2020-02-18 DIAGNOSIS — M5090 Cervical disc disorder, unspecified, unspecified cervical region: Secondary | ICD-10-CM | POA: Diagnosis not present

## 2020-02-18 DIAGNOSIS — M19041 Primary osteoarthritis, right hand: Secondary | ICD-10-CM | POA: Insufficient documentation

## 2020-02-18 DIAGNOSIS — M19042 Primary osteoarthritis, left hand: Secondary | ICD-10-CM | POA: Insufficient documentation

## 2020-02-18 DIAGNOSIS — M961 Postlaminectomy syndrome, not elsewhere classified: Secondary | ICD-10-CM | POA: Diagnosis not present

## 2020-02-18 DIAGNOSIS — M5136 Other intervertebral disc degeneration, lumbar region: Secondary | ICD-10-CM | POA: Diagnosis not present

## 2020-02-18 DIAGNOSIS — G894 Chronic pain syndrome: Secondary | ICD-10-CM | POA: Insufficient documentation

## 2020-02-18 DIAGNOSIS — M7918 Myalgia, other site: Secondary | ICD-10-CM | POA: Insufficient documentation

## 2020-02-18 MED ORDER — AMITRIPTYLINE HCL 150 MG PO TABS: 75.0000 mg | ORAL_TABLET | Freq: Every day | ORAL | 1 refills | Status: DC

## 2020-02-18 MED ORDER — OXYCODONE HCL 5 MG PO TABS: 10.0000 mg | ORAL_TABLET | Freq: Two times a day (BID) | ORAL | 0 refills | Status: DC

## 2020-02-18 NOTE — Progress Notes (Signed)
Subjective:    Patient ID: Jane English, female    DOB: 01/01/1971, 49 y.o.   MRN: [id number]  HPI 49 year old woman with chronic pain after failed back surgery. She would like to be able to spend more time with her grandchildren, drive. Her pain was better controlled in the past but when she switched doctors her pain increased. Pain is now better controlled again. Currently rates as 8 out of 10 compared to 10/10 last office visit. Since then I administered viscosupplementation injection to painful knee.  Her pain is worst in the neck and she has tried Voltaren gel. She does have shooting pain into her lower legs, as well as pain in bilateral knees.   She has an allergy to Gabapentin. She was prescribed steroids to help reduce inflammatory pain but she does not like taking them because they make her gain weight.  Her pain and function have been improved with Roxicodone. She has been taking 2 tablets twice per day and has been able to engage in her daily ADLs. She is currently undergoing a divorce and this is a major source of stress for her, though she has a great support system of her 4 children and 2 grandchildren, cousin, and best friend. She feels better able to handle her separation from  Her husband now. Her sources of joy are spending time with her family, especially her grandchildren, watching TV. She has been going up and down her 17 stairs for exerciseevery day except when it rains outside. She has had a 7 lbs weight loss since I saw her 51 weeks ago!  She had spinal cord stimulator placed in 2007 and it has been nonfunctional for some time, she was told possibly due to scar tissue. She has since seen Dr. Clydell Hakim and was told a keloid likely formed around her neurostimulator and it was best to leave as is.   She has followed with rheumatology and had labs and XRs done. She describes bilateral and, shoulder, and knee pain,worst in her right knee. She has benefitted from injection  to her knee in the past and was interested in OT for her hand arthritis, but it was making her pain worse so she discontinued it.     Pain Inventory Average Pain 8 Pain Right Now 8 My pain is sharp, burning, dull, stabbing, tingling and aching  In the last 24 hours, has pain interfered with the following? General activity 6 Relation with others 6 Enjoyment of life 10 What TIME of day is your pain at its worst? morning and evening Sleep (in general) Fair  Pain is worse with: walking, bending, sitting, standing and some activites Pain improves with: medication Relief from Meds: 8  Mobility how many minutes can you walk? 10-15 do you drive?  yes  Function Do you have any goals in this area?  no  Neuro/Psych weakness numbness tingling spasms anxiety  Prior Studies Any changes since last visit?  no  Physicians involved in your care Any changes since last visit?  no   Family History  Problem Relation Age of Onset  . Lung cancer Father   . Stomach cancer Father   . Esophageal cancer Father   . Alcohol abuse Father   . Mental illness Father   . Diabetes Sister   . Hypertension Sister   . Bipolar disorder Sister   . Schizophrenia Sister   . Diabetes Sister   . Alcohol abuse Brother   . Hypertension Brother   .  Kidney disease Brother   . Diabetes Brother   . Drug abuse Brother   . Mental illness Brother   . Alcohol abuse Brother   . Alcohol abuse Brother   . Hypertension Brother   . Diabetes Brother   . Alcohol abuse Brother   . Physical abuse Mother   . Alcohol abuse Mother   . Cirrhosis Mother   . Mental illness Brother        in [place]  . Drug abuse Sister   . HIV Sister   . Alcohol abuse Brother   . Pneumonia Sister        died as a baby  . Alcohol abuse Brother   . Bipolar disorder Brother   . Bipolar disorder Daughter   . Bipolar disorder Son   . Bipolar disorder Son   . Hypertension Brother   . Bipolar disorder Brother   . Drug abuse  Brother   . Alcohol abuse Brother   . Bipolar disorder Brother   . ADD / ADHD Neg Hx   . Anxiety disorder Neg Hx   . Dementia Neg Hx   . Depression Neg Hx   . OCD Neg Hx   . Seizures Neg Hx   . Paranoid behavior Neg Hx   . Colon cancer Neg Hx    Social History   Socioeconomic History  . Marital status: Married    Spouse name: Not on file  . Number of children: 4  . Years of education: Not on file  . Highest education level: Not on file  Occupational History  . Not on file  Tobacco Use  . Smoking status: Never Smoker  . Smokeless tobacco: Never Used  Substance and Sexual Activity  . Alcohol use: No  . Drug use: No  . Sexual activity: Yes    Partners: Male    Birth control/protection: Surgical    Comment: tubal  Other Topics Concern  . Not on file  Social History Narrative  . Not on file   Social Determinants of Health   Financial Resource Strain:   . Difficulty of Paying Living Expenses:   Food Insecurity:   . Worried About Charity fundraiser in the Last Year:   . Arboriculturist in the Last Year:   Transportation Needs:   . Film/video editor (Medical):   Marland Kitchen Lack of Transportation (Non-Medical):   Physical Activity:   . Days of Exercise per Week:   . Minutes of Exercise per Session:   Stress:   . Feeling of Stress :   Social Connections:   . Frequency of Communication with Friends and Family:   . Frequency of Social Gatherings with Friends and Family:   . Attends Religious Services:   . Active Member of Clubs or Organizations:   . Attends Archivist Meetings:   Marland Kitchen Marital Status:    Past Surgical History:  Procedure Laterality Date  . ANTERIOR CERVICAL DECOMP/DISCECTOMY FUSION  07/07/2012   Procedure: ANTERIOR CERVICAL DECOMPRESSION/DISCECTOMY FUSION 2 LEVELS;  Surgeon: Floyce Stakes, MD;  Location: [facility];  Service: Neurosurgery;  Laterality: N/A;  Cervical four-five, five - six  Anterior cervical  decompression/diskectomy/fusion/plate  . APPENDECTOMY  1986  . BOWEL RESECTION N/A 07/29/2013   Procedure: serosal repair;  Surgeon: Adin Hector, MD;  Location: [facility];  Service: General;  Laterality: N/A;  . CARPAL TUNNEL RELEASE Bilateral   . LAPAROSCOPY N/A 07/29/2013   Procedure: diagnostic laporoscopy;  Surgeon: Remo Lipps  C. Gross, MD;  Location: [facility];  Service: General;  Laterality: N/A;  . LAPAROSCOPY N/A 08/16/2013   Procedure: LAPAROSCOPY DIAGNOSTIC/LYSIS OF ADHESIONS;  Surgeon: Adin Hector, MD;  Location: [facility];  Service: General;  Laterality: N/A;  . LAPAROTOMY N/A 08/16/2013   Procedure: EXPLORATORY LAPAROTOMY/SMALL BOWEL RESECTION (JEJUNUM);  Surgeon: Adin Hector, MD;  Location: [facility];  Service: General;  Laterality: N/A;  . LUMBAR SPINE SURGERY  2010   x 3  . LYSIS OF ADHESION  2003   Dr. Irving Shows  . LYSIS OF ADHESION N/A 07/29/2013   Procedure: LYSIS OF ADHESION;  Surgeon: Adin Hector, MD;  Location: [facility];  Service: General;  Laterality: N/A;  . OOPHORECTOMY    . PARTIAL HYSTERECTOMY  1990s?   [place],   . SPINAL CORD STIMULATOR IMPLANT    . TRIGGER FINGER RELEASE  2009   right pinkie finger  . TUBAL LIGATION  1994   Past Medical History:  Diagnosis Date  . Anemia   . Asthma   . Asthma flare 04/09/2013  . Back pain   . Bronchitis   . Chronic abdominal pain   . Chronic constipation   . Constipation due to opioid therapy   . Depression   . Diabetes mellitus without complication ([place])   . Diabetes mellitus, type II ([place])   . DVT (deep venous thrombosis) ([place]) 2010  . GERD (gastroesophageal reflux disease)   . Heart murmur    no cardiologist  . Helicobacter pylori gastritis 06/11/2013   Colonoscopy Dr. Hilarie Fredrickson  . Hypertension   . IBS (irritable bowel syndrome)   . Migraine headache   . Neuropathy   . Obesity   . Obsessive-compulsive disorder   . PSYCHOTIC D/O W/HALLUCINATIONS CONDS CLASS ELSW 03/04/2010   Qualifier: Diagnosis of  By:  Moshe Cipro MD, Joycelyn Schmid    . PTSD (post-traumatic stress disorder)   . SBO (small bowel obstruction) ([place]) 08/09/2013  . Seasonal allergies 12/10/2012  . Seizures ([place])   . Shortness of breath    BP 131/85   Pulse (!) 102   Temp (!) 97.5 F (36.4 C)   Ht 5\' 6"  (1.676 m)   Wt 183 lb 3.2 oz (83.1 kg)   SpO2 98%   BMI 29.57 kg/m   Opioid Risk Score:   Fall Risk Score:  `1  Depression screen PHQ 2/9  Depression screen [facility] 2/9 02/18/2020 02/01/2020 12/28/2019 10/27/2019 09/27/2019 09/23/2019 08/10/2019  Decreased Interest 0 0 0 0 0 2 0  Down, Depressed, Hopeless 0 0 0 0 0 2 0  PHQ - 2 Score 0 0 0 0 0 4 0  Altered sleeping - - 0 - 1 2 -  Tired, decreased energy - - 0 - 1 2 -  Change in appetite - - 0 - 0 2 -  Feeling bad or failure about yourself  - - 0 - 0 1 -  Trouble concentrating - - 0 - 0 2 -  Moving slowly or fidgety/restless - - 0 - 0 0 -  Suicidal thoughts - - 0 - 0 0 -  PHQ-9 Score - - 0 - 2 13 -  Difficult doing work/chores - - Not difficult at all - Not difficult at all Somewhat difficult -  Some encounter information is confidential and restricted. Go to Review Flowsheets activity to see all data.  Some recent data might be hidden    Review of Systems  Constitutional: Positive for unexpected weight change.  HENT: Negative.  Eyes: Negative.   Respiratory: Negative.   Cardiovascular: Negative.   Gastrointestinal: Negative.   Endocrine: Negative.   Genitourinary: Negative.   Musculoskeletal:       Spasms  Skin: Negative.   Neurological: Positive for numbness.       Tingling  Hematological: Negative.   Psychiatric/Behavioral: The patient is nervous/anxious.   All other systems reviewed and are negative.      Objective:   Physical Exam Gen: no distress, normal appearing, evidence of weight loss.  HEENT: oral mucosa pink and moist, NCAT Cardio: Reg rate Chest: normal effort, normal rate of breathing Abd: soft, non-distended Ext: no edema Skin: neurostimulator  palpable in right lower back Neuro: AOx3 Follows commands.  MSK: Normal gait and tandem walk. Full ROM of lumbar spine on flexion, limited in extension. Both flexion and extension result in pain, but less than last visit.+Slump test on the left, which produces pain down the leg to the calf in the S1 distribution. +bilateral hand tremor is much improved to resolved.  Psych: pleasant, normal affect     Assessment & Plan:  49 year old woman with chronic pain after failed back surgery.  --She has severe pain, worse in her lower back, cervical spine, and knee. Pain has continued to improve with medication and after Synvisc-One injection to knee.   -Reviewed MRI lumbar spine from 2013 with shows the following: 1. Stable appearance of the lumbar spine status post L5-S1 PLIF. 2. No significant adjacent segment disease, disc herniation, acquired spinal stenosis or nerve root encroachment. Mild facet degenerative changes inferiorly are stable. 3. No abnormal intradural enhancement.   --She has been on Oxycodone in the past and had functional benefits. She was able to walk more, about 2 miles per day. She as tried surgery, injections, and neuropathic pain medications without relief or with negative side effects.After UDS and contract, I prescribed Roxicodone 10mg  BID.Discussed that our goals are functional improvement and not long term dependence on oxycodone. Discussed creating a daily exercise regimen for pain control and overall health.She has been doing her 17 stairs daily and has lost 7 lbs in 4 weeks!   --She has seen Dr. Clydell Hakim at [facility] and told that there is likely keloid formation around her inactive pain stimulation system, so it would be best to leave as is.    --Life stressors. Discussed at length her stress regarding her divorce, as well as her sources of support and joy. Encouraged social interaction and physical activity. Today is her birthday and she is looking  forward to spending time with her family and dogs.    All questions were encouraged and answered. Follow up with me in 1 month.

## 2020-02-23 ENCOUNTER — Encounter: Payer: Self-pay | Admitting: Family Medicine

## 2020-02-23 ENCOUNTER — Other Ambulatory Visit: Payer: Self-pay

## 2020-02-23 ENCOUNTER — Telehealth: Payer: Self-pay | Admitting: Family Medicine

## 2020-02-23 ENCOUNTER — Ambulatory Visit (INDEPENDENT_AMBULATORY_CARE_PROVIDER_SITE_OTHER): Payer: Medicare HMO | Admitting: Family Medicine

## 2020-02-23 VITALS — BP 140/81 | HR 97 | Temp 97.6°F | Ht 66.0 in | Wt 185.8 lb

## 2020-02-23 DIAGNOSIS — J302 Other seasonal allergic rhinitis: Secondary | ICD-10-CM | POA: Diagnosis not present

## 2020-02-23 DIAGNOSIS — J4550 Severe persistent asthma, uncomplicated: Secondary | ICD-10-CM | POA: Diagnosis not present

## 2020-02-23 MED ORDER — PREDNISONE 10 MG PO TABS: 10.0000 mg | ORAL_TABLET | Freq: Two times a day (BID) | ORAL | 0 refills | Status: DC

## 2020-02-23 NOTE — Patient Instructions (Addendum)
  xyzal-use in place of zyrtec for the next week Limit exposure -environmental Prednisone -take with food-ER if no improvement   If you have lab work done today you will be contacted with your lab results within the next 2 weeks.  If you have not heard from [place] then please contact us. The fastest way to get your results is to register for My Chart.   IF you received an x-ray today, you will receive an invoice from [facility]. Please contact [facility] at [phone] with questions or concerns regarding your invoice.   IF you received labwork today, you will receive an invoice from [facility]. Please contact [facility] at [phone] with questions or concerns regarding your invoice.   Our billing staff will not be able to assist you with questions regarding bills from these companies.  You will be contacted with the lab results as soon as they are available. The fastest way to get your results is to activate your My Chart account. Instructions are located on the last page of this paperwork. If you have not heard from [place] regarding the results in 2 weeks, please contact this office.

## 2020-02-23 NOTE — Telephone Encounter (Signed)
Left msg on machine.

## 2020-02-23 NOTE — Telephone Encounter (Signed)
Pt called and was seen in the office today, she states the pharmacy received the prednisone but not the other medication you mentioned that would be called in as well. Pt unsure of the name

## 2020-02-23 NOTE — Telephone Encounter (Signed)
Xyzal-available over the counter. Take instead of zyrtec daily. Drink plenty of water as it like zyrtec will make her mouth dry. Use nasal saline and eye drops since this medication will make her extremely dry.  Should help the environmental triggers

## 2020-02-23 NOTE — Progress Notes (Signed)
Established Patient Office Visit  Subjective:  Patient ID: Jane English, female    DOB: 25-May-1971  Age: 49 y.o. MRN: [id number]  CC:  Chief Complaint  Patient presents with  . Hand Pain    pain and swelling in both hands. Can relief the pain pain sometimes by running under hot water  . neck swelling    neck swelling for the past 2 days  . Chest Pain    chest pain for the past 2 days as well was given amitrptyline for the pain other than the prednisone    HPI Jane English presents for chest pain-noted when walking dog yesterday-3 miles-pt states onset chest pain-"tightness" used albuterol nebulizer-did not help. Pt had used MDI prior to walk.  Pain Scale- 6/10 with tightness-decreased 3/10 after using nebulizer after walk. Took shower and got dressed and pain back to a 6/10 and used nebulizer again to pain level 3/10-as pt went down steps to get in car. Pt set in car due to increase in symptoms-pt used 2 puffs Albuterol and drove onto [address]-pt did not feel anything until walking in store when she started to experience tightness and repeated albuterol. Pt was suppose to see allergy and asthma for PFT but arrived later and was not seen.  Pt was given prednisone prior to appointment with medications completed 3 days before appointment as instructed for pft to be accurate. Pt states she is no using zyrtec "it makes me to dry" so she uses every other day. Pt is using symbicort BID, flonase, singular and patanol. Pt has not started Qvar. Pt got a new puppy for Easter-already had a yorkie-no known dog hair allergy. Pt has walked the puppy multiple times a day since she lives in an apartment and is trying to exercise the dog.  Pt does not have family support to assist with dog care  Asthma and Allergy-02/03/20 Severe persistent asthma without complication Diagnosed with asthma over 30 years ago and using Symbicort 160 2 puffs twice a day and albuterol a few times a day. Patient has history of  hospitalization with intubation in the past.   Due to concern about suspected COVID-19 infection today, will do spirometry at next visit.   Start prednisone taper.(completed 14 days)   Use your nebulizer twice a day - before using Symbicort for the next 1 week. Then may use every 4 to 6 hours as needed.   Daily controller medication(s):Continue Symbicort 160 2 puffs twice a day with spacer and rinse mouth afterwards.  Prior to physical activity:May use albuterol rescue inhaler 2 puffs 5 to 15 minutes prior to strenuous physical activities.  Rescue medications:May use albuterol rescue inhaler 2 puffs or nebulizer every 4 to 6 hours as needed for shortness of breath, chest tightness, coughing, and wheezing. Monitor frequency of use.   Consider adding LAMA inhaler next.   Chronic rhinitis Perennial rhino conjunctivitis symptoms for 30+ years with worsening in the summer. Tried Singulair, zyrtec, Astelin, Patanol with minimal improvement in symptoms. No recent allergy skin testing.  Unable to skin test today due to recent antihistamine intake.  Continue Singulair (montelukast) 82m daily.  Continue Astelin nasal spray 2 sprays per nostril 1-2 time a day as needed for runny nose.  Start Fluticasone 1-2 sprays per nostril daily for nasal congestion.   May use olopatadine 0.1% eye drop 1 drop in each eye twice daily as needed for itchy/watery eyes.   Continue zyrtec 162mdaily - hold 3 days before next  appointment for skin testing.  (Uses qod on zyrtec due to drying effect) Pruritic rash New onset pruritic rash.   The prednisone should help clear this up.  Take pictures of the rash if it flares.   Gave handout on proper skin care.  (cream given-helped symptoms-benadryl now used) Return in about 2 weeks (around 02/17/2020) for Skin testing.-appt 5/13-PFT pending-pt was late for appt so rescheduled  Past Medical History:  Diagnosis Date  . Anemia   . Asthma   . Asthma flare  04/09/2013  . Back pain   . Bronchitis   . Chronic abdominal pain   . Chronic constipation   . Constipation due to opioid therapy   . Depression   . Diabetes mellitus without complication ([place])   . Diabetes mellitus, type II ([place])   . DVT (deep venous thrombosis) ([place]) 2010  . GERD (gastroesophageal reflux disease)   . Heart murmur    no cardiologist  . Helicobacter pylori gastritis 06/11/2013   Colonoscopy Dr. Hilarie Fredrickson  . Hypertension   . IBS (irritable bowel syndrome)   . Migraine headache   . Neuropathy   . Obesity   . Obsessive-compulsive disorder   . PSYCHOTIC D/O W/HALLUCINATIONS CONDS CLASS ELSW 03/04/2010   Qualifier: Diagnosis of  By: Moshe Cipro MD, Joycelyn Schmid    . PTSD (post-traumatic stress disorder)   . SBO (small bowel obstruction) ([place]) 08/09/2013  . Seasonal allergies 12/10/2012  . Seizures ([place])   . Shortness of breath     Past Surgical History:  Procedure Laterality Date  . ANTERIOR CERVICAL DECOMP/DISCECTOMY FUSION  07/07/2012   Procedure: ANTERIOR CERVICAL DECOMPRESSION/DISCECTOMY FUSION 2 LEVELS;  Surgeon: Floyce Stakes, MD;  Location: [facility];  Service: Neurosurgery;  Laterality: N/A;  Cervical four-five, five - six  Anterior cervical decompression/diskectomy/fusion/plate  . APPENDECTOMY  1986  . BOWEL RESECTION N/A 07/29/2013   Procedure: serosal repair;  Surgeon: Adin Hector, MD;  Location: [facility];  Service: General;  Laterality: N/A;  . CARPAL TUNNEL RELEASE Bilateral   . LAPAROSCOPY N/A 07/29/2013   Procedure: diagnostic laporoscopy;  Surgeon: Adin Hector, MD;  Location: [facility];  Service: General;  Laterality: N/A;  . LAPAROSCOPY N/A 08/16/2013   Procedure: LAPAROSCOPY DIAGNOSTIC/LYSIS OF ADHESIONS;  Surgeon: Adin Hector, MD;  Location: [facility];  Service: General;  Laterality: N/A;  . LAPAROTOMY N/A 08/16/2013   Procedure: EXPLORATORY LAPAROTOMY/SMALL BOWEL RESECTION (JEJUNUM);  Surgeon: Adin Hector, MD;  Location: [facility];  Service: General;   Laterality: N/A;  . LUMBAR SPINE SURGERY  2010   x 3  . LYSIS OF ADHESION  2003   Dr. Irving Shows  . LYSIS OF ADHESION N/A 07/29/2013   Procedure: LYSIS OF ADHESION;  Surgeon: Adin Hector, MD;  Location: [facility];  Service: General;  Laterality: N/A;  . OOPHORECTOMY    . PARTIAL HYSTERECTOMY  1990s?   [place], [place]  . SPINAL CORD STIMULATOR IMPLANT    . TRIGGER FINGER RELEASE  2009   right pinkie finger  . TUBAL LIGATION  1994    Family History  Problem Relation Age of Onset  . Lung cancer Father   . Stomach cancer Father   . Esophageal cancer Father   . Alcohol abuse Father   . Mental illness Father   . Diabetes Sister   . Hypertension Sister   . Bipolar disorder Sister   . Schizophrenia Sister   . Diabetes Sister   . Alcohol abuse Brother   . Hypertension  Brother   . Kidney disease Brother   . Diabetes Brother   . Drug abuse Brother   . Mental illness Brother   . Alcohol abuse Brother   . Alcohol abuse Brother   . Hypertension Brother   . Diabetes Brother   . Alcohol abuse Brother   . Physical abuse Mother   . Alcohol abuse Mother   . Cirrhosis Mother   . Mental illness Brother        in [place]  . Drug abuse Sister   . HIV Sister   . Alcohol abuse Brother   . Pneumonia Sister        died as a baby  . Alcohol abuse Brother   . Bipolar disorder Brother   . Bipolar disorder Daughter   . Bipolar disorder Son   . Bipolar disorder Son   . Hypertension Brother   . Bipolar disorder Brother   . Drug abuse Brother   . Alcohol abuse Brother   . Bipolar disorder Brother   . ADD / ADHD Neg Hx   . Anxiety disorder Neg Hx   . Dementia Neg Hx   . Depression Neg Hx   . OCD Neg Hx   . Seizures Neg Hx   . Paranoid behavior Neg Hx   . Colon cancer Neg Hx     Social History   Socioeconomic History  . Marital status: Married    Spouse name: Not on file  . Number of children: 4  . Years of education: Not on file  . Highest education level: Not on file   Occupational History  . Not on file  Tobacco Use  . Smoking status: Never Smoker  . Smokeless tobacco: Never Used  Substance and Sexual Activity  . Alcohol use: No  . Drug use: No  . Sexual activity: Yes    Partners: Male    Birth control/protection: Surgical    Comment: tubal  Other Topics Concern  . Not on file  Social History Narrative  . Not on file   Social Determinants of Health   Financial Resource Strain:   . Difficulty of Paying Living Expenses:   Food Insecurity:   . Worried About Charity fundraiser in the Last Year:   . Arboriculturist in the Last Year:   Transportation Needs:   . Film/video editor (Medical):   Marland Kitchen Lack of Transportation (Non-Medical):   Physical Activity:   . Days of Exercise per Week:   . Minutes of Exercise per Session:   Stress:   . Feeling of Stress :   Social Connections:   . Frequency of Communication with Friends and Family:   . Frequency of Social Gatherings with Friends and Family:   . Attends Religious Services:   . Active Member of Clubs or Organizations:   . Attends Archivist Meetings:   Marland Kitchen Marital Status:   Intimate Partner Violence:   . Fear of Current or Ex-Partner:   . Emotionally Abused:   Marland Kitchen Physically Abused:   . Sexually Abused:     Outpatient Medications Prior to Visit  Medication Sig Dispense Refill  . albuterol (PROVENTIL) (2.5 MG/3ML) 0.083% nebulizer solution Take 3 mLs (2.5 mg total) by nebulization every 6 (six) hours as needed for wheezing or shortness of breath. 150 mL 1  . albuterol (VENTOLIN HFA) 108 (90 Base) MCG/ACT inhaler TAKE 2 PUFFS BY MOUTH EVERY 6 HOURS AS NEEDED FOR WHEEZE OR SHORTNESS OF BREATH 18 g  4  . ALPRAZolam (XANAX) 1 MG tablet Take one tablet by mouth once daily for anxiety (Patient taking differently: Take 0.5 mg by mouth daily. Take one tablet by mouth once daily for anxiety) 30 tablet 5  . amitriptyline (ELAVIL) 150 MG tablet Take 0.5 tablets (75 mg total) by mouth at  bedtime. 30 tablet 1  . azelastine (ASTELIN) 0.1 % nasal spray Place 2 sprays into both nostrils 2 (two) times daily. Use in each nostril as directed 30 mL 12  . beclomethasone (QVAR REDIHALER) 40 MCG/ACT inhaler Inhale 2 puffs into the lungs 2 (two) times daily. 10.6 g 5  . budesonide-formoterol (SYMBICORT) 160-4.5 MCG/ACT inhaler Inhale 2 puffs into the lungs 2 (two) times daily. PRN 1 Inhaler 3  . cetirizine (ZYRTEC) 10 MG tablet Take 1 tablet (10 mg total) by mouth daily. 30 tablet 11  . cloNIDine (CATAPRES) 0.1 MG tablet TAKE 1 TABLET BY MOUTH EVERYDAY AT BEDTIME 90 tablet 1  . clotrimazole-betamethasone (LOTRISONE) cream APPLY TO AFFECTED AREA TWICE A DAY 45 g 5  . diclofenac Sodium (VOLTAREN) 1 % GEL Apply topically 4 (four) times daily.    . ergocalciferol (VITAMIN D2) 1.25 MG (50000 UT) capsule Take 1 capsule (50,000 Units total) by mouth once a week. One capsule once weekly 12 capsule 1  . erythromycin ophthalmic ointment APPLY (1CM) RIBBON INTO THE LOWER CONJUNCTIVAL SAC(S) IN THE AFFECTED EYE(S) 3 TIMES A DAY    . fluticasone (FLONASE) 50 MCG/ACT nasal spray Place 1-2 sprays into both nostrils daily. 16 g 2  . LINZESS 290 MCG CAPS capsule TAKE 1 CAPSULE (290 MCG TOTAL) BY MOUTH DAILY BEFORE BREAKFAST. 90 capsule 2  . medroxyPROGESTERone (PROVERA) 10 MG tablet TAKE 1 TABLET BY MOUTH DAILY FOR 2 WEEKS. REPEAT AS DIRECTED BY PHYSICIAN EVERY 3 MONTHS    . montelukast (SINGULAIR) 10 MG tablet TAKE 1 TABLET BY MOUTH EVERYDAY AT BEDTIME 90 tablet 0  . NARCAN 4 MG/0.1ML LIQD nasal spray kit Place 4 mg into the nose as directed.  1  . NONFORMULARY OR COMPOUNDED ITEM Apply 1-2 pumps, 3-4 times daily as needed. 1 each 0  . olopatadine (PATANOL) 0.1 % ophthalmic solution Place 1 drop into both eyes 2 (two) times daily. 5 mL 12  . oxyCODONE (ROXICODONE) 5 MG immediate release tablet Take 2 tablets (10 mg total) by mouth 2 (two) times daily. 120 tablet 0  . pantoprazole (PROTONIX) 40 MG tablet TAKE 1  TABLET BY MOUTH EVERY DAY 90 tablet 2  . phentermine (ADIPEX-P) 37.5 MG tablet Take 1 tablet (37.5 mg total) by mouth daily before breakfast. Take one half tablet once daily at breakfast for appetite suppression 30 tablet 1  . potassium chloride 20 MEQ/15ML (10%) SOLN Take 15 mLs (20 mEq total) by mouth 2 (two) times daily. 473 mL 1  . triamterene-hydrochlorothiazide (MAXZIDE-25) 37.5-25 MG tablet TAKE 1 TABLET BY MOUTH EVERY DAY (Patient taking differently: 0.5 tablets. TAKE 1 TABLET BY MOUTH EVERY DAY) 90 tablet 1  . trolamine salicylate (ASPERCREME) 10 % cream Apply 1 application topically as needed for muscle pain.    Marland Kitchen UNABLE TO FIND Nebulizer mouth piece and tubing  Dx J44.991 1 each 0   No facility-administered medications prior to visit.    Allergies  Allergen Reactions  . Neurontin [Gabapentin] Nausea And Vomiting and Other (See Comments)    Sleep walking/hallucinations   . Penicillins Hives, Shortness Of Breath and Other (See Comments)    Has patient had a PCN reaction  causing immediate rash, facial/tongue/throat swelling, SOB or lightheadedness with hypotension: Yes Has patient had a PCN reaction causing severe rash involving mucus membranes or skin necrosis: No Has patient had a PCN reaction that required hospitalization: No Has patient had a PCN reaction occurring within the last 10 years: Yes If all of the above answers are "NO", then may proceed with Cephalosporin use.  . Pregabalin Anaphylaxis, Shortness Of Breath, Diarrhea and Swelling    Lyrica  . Tramadol Other (See Comments)    Makes patient "delusional"  . Latex Rash    ROS Review of Systems  Constitutional: Negative for fatigue and fever.  Respiratory: Positive for chest tightness and shortness of breath.   Cardiovascular: Negative for chest pain, palpitations and leg swelling.  Musculoskeletal: Positive for arthralgias and neck stiffness.       Worse in hands and neck      Objective:    Physical Exam   Constitutional: She is oriented to person, place, and time. She appears well-developed and well-nourished. No distress.  HENT:  Head: Normocephalic and atraumatic.  Eyes: Conjunctivae are normal.  Cardiovascular: Normal rate, regular rhythm and normal heart sounds.  Pulmonary/Chest: Effort normal and breath sounds normal. No respiratory distress. She has no wheezes. She has no rales. She exhibits no tenderness.  Neurological: She is alert and oriented to person, place, and time.  Psychiatric: She has a normal mood and affect. Her behavior is normal.    BP 140/81 (BP Location: Right Arm, Patient Position: Sitting, Cuff Size: Normal)   Pulse 97   Temp 97.6 F (36.4 C) (Temporal)   Ht _0  (1.676 m)   Wt 185 lb 12.8 oz (84.3 kg)   SpO2 98%   BMI 29.99 kg/m  Wt Readings from Last 3 Encounters:  02/23/20 185 lb 12.8 oz (84.3 kg)  02/18/20 183 lb 3.2 oz (83.1 kg)  02/01/20 189 lb 9.6 oz (86 kg)     Health Maintenance Due  Topic Date Due  . COVID-19 Vaccine (1) Never done    Lab Results  Component Value Date   TSH 1.74 12/06/2019   Lab Results  Component Value Date   WBC 5.7 08/17/2019   HGB 12.3 08/17/2019   HCT 35.0 08/17/2019   MCV 89 08/17/2019   PLT 315 08/17/2019   Lab Results  Component Value Date   NA 138 12/06/2019   K 3.7 12/06/2019   CO2 26 12/06/2019   GLUCOSE 100 (H) 12/06/2019   BUN 13 12/06/2019   CREATININE 1.03 12/06/2019   BILITOT 0.3 12/06/2019   ALKPHOS 48 02/04/2019   AST 25 12/06/2019   ALT 32 (H) 12/06/2019   PROT 7.3 12/06/2019   ALBUMIN 4.1 02/04/2019   CALCIUM 9.7 12/06/2019   ANIONGAP 9 02/04/2019   GFR 87.40 04/23/2013   Lab Results  Component Value Date   CHOL 195 12/06/2019   Lab Results  Component Value Date   HDL 55 12/06/2019   Lab Results  Component Value Date   LDLCALC 117 (H) 12/06/2019   Lab Results  Component Value Date   TRIG 124 12/06/2019   Lab Results  Component Value Date   CHOLHDL 3.5 12/06/2019    Lab Results  Component Value Date   HGBA1C 6.4 (H) 01/04/2020      Assessment & Plan:  1. Severe persistent asthma without complication D/w pt asthma for 13mnutes-triggers, signs, treatment, prevention. Pt with increase in symptoms with exertion-walking dog, shopping with granddaughter, trigger environment-unaware that triggers are  worsening overall symptoms. Pt advised to limit outside exposure, wash clothes after going outside to eliminate outdoor environmental triggers, Use airconditioning-do not open the windows or doors. Use Xyzal daily-drink water to combat dryness in the mouth. Nasal and eye saline for dry nose and mouth.  Use googles and mask outside to limit inhalation and eye exposure. Leave shoes at the door after going outside. Prednisone-risk/benefit/side effects. TAKE WITH FOOD.  d/w pt-if no improvement in symptoms-ER for evaluation-d/w pt risk of extended symptoms SOB, chest tightness, cough, wheezing. Pt must keep appt with allergist for PFT.  Important to know baseline for appropriate treatment   2. Seasonal allergies xyzal DAILY-limit exposure   Follow-up: Allergist, ER if symptoms do not improve  Jewelz Kobus Hannah Beat, MD

## 2020-02-23 NOTE — Telephone Encounter (Signed)
Pt put in to see corum this am

## 2020-02-28 ENCOUNTER — Encounter: Payer: Self-pay | Admitting: Family Medicine

## 2020-02-28 DIAGNOSIS — Z0100 Encounter for examination of eyes and vision without abnormal findings: Secondary | ICD-10-CM | POA: Diagnosis not present

## 2020-02-28 DIAGNOSIS — H524 Presbyopia: Secondary | ICD-10-CM | POA: Diagnosis not present

## 2020-02-28 DIAGNOSIS — E119 Type 2 diabetes mellitus without complications: Secondary | ICD-10-CM | POA: Diagnosis not present

## 2020-02-29 ENCOUNTER — Other Ambulatory Visit: Payer: Self-pay | Admitting: Internal Medicine

## 2020-03-10 ENCOUNTER — Ambulatory Visit (INDEPENDENT_AMBULATORY_CARE_PROVIDER_SITE_OTHER): Payer: Medicare HMO | Admitting: Psychologist

## 2020-03-10 DIAGNOSIS — F603 Borderline personality disorder: Secondary | ICD-10-CM

## 2020-03-10 DIAGNOSIS — F4310 Post-traumatic stress disorder, unspecified: Secondary | ICD-10-CM | POA: Diagnosis not present

## 2020-03-16 ENCOUNTER — Ambulatory Visit: Payer: Medicare HMO | Admitting: Allergy

## 2020-03-16 ENCOUNTER — Other Ambulatory Visit: Payer: Self-pay

## 2020-03-16 ENCOUNTER — Encounter: Payer: Self-pay | Admitting: Allergy

## 2020-03-16 VITALS — BP 124/78 | HR 93 | Temp 97.3°F | Ht 64.76 in | Wt 182.0 lb

## 2020-03-16 DIAGNOSIS — L282 Other prurigo: Secondary | ICD-10-CM

## 2020-03-16 DIAGNOSIS — J4550 Severe persistent asthma, uncomplicated: Secondary | ICD-10-CM

## 2020-03-16 DIAGNOSIS — J310 Chronic rhinitis: Secondary | ICD-10-CM | POA: Diagnosis not present

## 2020-03-16 MED ORDER — SPIRIVA RESPIMAT 1.25 MCG/ACT IN AERS: 2.0000 | INHALATION_SPRAY | Freq: Every day | RESPIRATORY_TRACT | 5 refills | Status: DC

## 2020-03-16 NOTE — Progress Notes (Signed)
Follow Up Note  RE: Jane English MRN: [id number] DOB: 06-19-1971 Date of Office Visit: 03/16/2020  Referring provider: Fayrene Helper, MD Primary care provider: Fayrene Helper, MD  Chief Complaint: Asthma (Still short of Breath), Allergic Rhinitis  (Okay), and Eczema (Vanicream sample worked very good)  History of Present Illness: I had the pleasure of seeing Jane English for a follow up visit at the [facility] on 03/16/2020. She is a 49 y.o. female, who is being followed for asthma, rhinitis, pruritic rash. Her previous allergy office visit was on 02/03/2020 with Dr. Maudie Mercury. Today is a skin testing visit.  Severe persistent asthma Patient had another course of prednisone since the last visit. She is having shortness of breath, chest tightness just like before. The prednisone didn't help much while she was taking it.   Currently using Symbicort 160 2 puffs twice a day. Using albuterol 2 times a day and nebulizer 2 times a day.   Chronic rhinitis Using Astelin and Flonase alternating with some benefit. Didn't know that she can take both at the same time.  Zyrtec caused some dryness so stopped taking it daily.  Still taking Singulair and eye drops as needed.  Pruritic rash Used topical Vanicream steroid cream for the rash which helped.  Assessment and Plan: Jane English is a 49 y.o. female with: Not well controlled severe persistent asthma Past history - Diagnosed with asthma over 30 years ago and using Symbicort 160 2 puffs twice a day and albuterol a few times a day. Patient has history of hospitalization with intubation in the past. CXR in March 2021 no active disease.  Interim history - finished prednisone taper and had another course with minimal benefit.   Today's spirometry showed: restrictive disease with no improvement in FEV1 post bronchodilator treatment. Clinically feeling slightly improved. . Will get CBC diff and IgE level to see if patient would meet  criteria for biologics.   . Daily controller medication(s): Continue Symbicort 160 2 puffs twice a day with spacer and rinse mouth afterwards. o START Spiriva 1.40mg 2 puff once a day. Sample given.  . Prior to physical activity: May use albuterol rescue inhaler 2 puffs 5 to 15 minutes prior to strenuous physical activities. .Marland KitchenRescue medications: May use albuterol rescue inhaler 2 puffs or nebulizer every 4 to 6 hours as needed for shortness of breath, chest tightness, coughing, and wheezing. Monitor frequency of use.  . Repeat spirometry at next visit.  Chronic rhinitis Past history - Perennial rhino conjunctivitis symptoms for 30+ years with worsening in the summer. Tried Singulair, zyrtec, Astelin, Patanol with minimal improvement in symptoms. No recent allergy skin testing. Interim history - symptoms unchanged. Zyrtec caused dryness.  We discussed getting environmental allergy panel via bloodwork to do her respiratory status today.   Continue Singulair (montelukast) 158mdaily.  Continue Astelin nasal spray 2 sprays per nostril 1-2 time a day as needed for runny nose.  Continue Fluticasone 1-2 sprays per nostril daily for nasal congestion.   Nasal saline spray (i.e., Simply Saline) or nasal saline lavage (i.e., NeilMed) is recommended as needed and prior to medicated nasal sprays.  May use olopatadine eye drop 1 drop in each eye twice daily as needed for itchy/watery eyes.   Try Allegra 18014mnce a day - samples given.   Pruritic rash Improved with Vanicream hydrocortisone cream.  Take pictures of the rash.  See proper skin care below.  May use Vanicream hydrocortisone cream as  needed - this is over the counter. Samples given.   Return in about 4 weeks (around 04/13/2020).  Meds ordered this encounter  Medications  . Tiotropium Bromide Monohydrate (SPIRIVA RESPIMAT) 1.25 MCG/ACT AERS    Sig: Inhale 2 puffs into the lungs daily.    Dispense:  4 g    Refill:  5    Lab  Orders     Allergens w/Total IgE Area 2     CBC with Differential/Platelet  Diagnostics: Spirometry:  Tracings reviewed. Her effort: It was hard to get consistent efforts and there is a question as to whether this reflects a maximal maneuver. FVC: 1.57L FEV1: 1.46L, 63% predicted FEV1/FVC ratio: 93% Interpretation: Spirometry consistent with possible restrictive disease with no improvement in FEV1 post bronchodilator treatment. Clinically feeling slightly improved. Please see scanned spirometry results for details.  Medication List:  Current Outpatient Medications  Medication Sig Dispense Refill  . albuterol (PROVENTIL) (2.5 MG/3ML) 0.083% nebulizer solution Take 3 mLs (2.5 mg total) by nebulization every 6 (six) hours as needed for wheezing or shortness of breath. 150 mL 1  . albuterol (VENTOLIN HFA) 108 (90 Base) MCG/ACT inhaler TAKE 2 PUFFS BY MOUTH EVERY 6 HOURS AS NEEDED FOR WHEEZE OR SHORTNESS OF BREATH 18 g 4  . ALPRAZolam (XANAX) 1 MG tablet Take one tablet by mouth once daily for anxiety (Patient taking differently: Take 0.5 mg by mouth daily. Take one tablet by mouth once daily for anxiety) 30 tablet 5  . amitriptyline (ELAVIL) 150 MG tablet Take 0.5 tablets (75 mg total) by mouth at bedtime. 30 tablet 1  . azelastine (ASTELIN) 0.1 % nasal spray Place 2 sprays into both nostrils 2 (two) times daily. Use in each nostril as directed 30 mL 12  . beclomethasone (QVAR REDIHALER) 40 MCG/ACT inhaler Inhale 2 puffs into the lungs 2 (two) times daily. 10.6 g 5  . budesonide-formoterol (SYMBICORT) 160-4.5 MCG/ACT inhaler Inhale 2 puffs into the lungs 2 (two) times daily. PRN 1 Inhaler 3  . cetirizine (ZYRTEC) 10 MG tablet Take 1 tablet (10 mg total) by mouth daily. 30 tablet 11  . cloNIDine (CATAPRES) 0.1 MG tablet TAKE 1 TABLET BY MOUTH EVERYDAY AT BEDTIME 90 tablet 1  . clotrimazole-betamethasone (LOTRISONE) cream APPLY TO AFFECTED AREA TWICE A DAY 45 g 5  . erythromycin ophthalmic  ointment APPLY (1CM) RIBBON INTO THE LOWER CONJUNCTIVAL SAC(S) IN THE AFFECTED EYE(S) 3 TIMES A DAY    . fluticasone (FLONASE) 50 MCG/ACT nasal spray Place 1-2 sprays into both nostrils daily. 16 g 2  . LINZESS 290 MCG CAPS capsule TAKE 1 CAPSULE (290 MCG TOTAL) BY MOUTH DAILY BEFORE BREAKFAST. 90 capsule 2  . medroxyPROGESTERone (PROVERA) 10 MG tablet TAKE 1 TABLET BY MOUTH DAILY FOR 2 WEEKS. REPEAT AS DIRECTED BY PHYSICIAN EVERY 3 MONTHS    . montelukast (SINGULAIR) 10 MG tablet TAKE 1 TABLET BY MOUTH EVERYDAY AT BEDTIME 90 tablet 0  . NARCAN 4 MG/0.1ML LIQD nasal spray kit Place 4 mg into the nose as directed.  1  . NONFORMULARY OR COMPOUNDED ITEM Apply 1-2 pumps, 3-4 times daily as needed. 1 each 0  . oxyCODONE (ROXICODONE) 5 MG immediate release tablet Take 2 tablets (10 mg total) by mouth 2 (two) times daily. 120 tablet 0  . pantoprazole (PROTONIX) 40 MG tablet TAKE 1 TABLET BY MOUTH EVERY DAY 90 tablet 2  . phentermine (ADIPEX-P) 37.5 MG tablet Take 1 tablet (37.5 mg total) by mouth daily before breakfast. Take one half  tablet once daily at breakfast for appetite suppression 30 tablet 1  . potassium chloride 20 MEQ/15ML (10%) SOLN Take 15 mLs (20 mEq total) by mouth 2 (two) times daily. 473 mL 1  . triamterene-hydrochlorothiazide (MAXZIDE-25) 37.5-25 MG tablet TAKE 1 TABLET BY MOUTH EVERY DAY (Patient taking differently: 0.5 tablets. TAKE 1 TABLET BY MOUTH EVERY DAY) 90 tablet 1  . trolamine salicylate (ASPERCREME) 10 % cream Apply 1 application topically as needed for muscle pain.    Marland Kitchen UNABLE TO FIND Nebulizer mouth piece and tubing  Dx J44.991 1 each 0  . Tiotropium Bromide Monohydrate (SPIRIVA RESPIMAT) 1.25 MCG/ACT AERS Inhale 2 puffs into the lungs daily. 4 g 5   No current facility-administered medications for this visit.   Allergies: Allergies  Allergen Reactions  . Neurontin [Gabapentin] Nausea And Vomiting and Other (See Comments)    Sleep walking/hallucinations   .  Penicillins Hives, Shortness Of Breath and Other (See Comments)    Has patient had a PCN reaction causing immediate rash, facial/tongue/throat swelling, SOB or lightheadedness with hypotension: Yes Has patient had a PCN reaction causing severe rash involving mucus membranes or skin necrosis: No Has patient had a PCN reaction that required hospitalization: No Has patient had a PCN reaction occurring within the last 10 years: Yes If all of the above answers are "NO", then may proceed with Cephalosporin use.  . Pregabalin Anaphylaxis, Shortness Of Breath, Diarrhea and Swelling    Lyrica  . Tramadol Other (See Comments)    Makes patient "delusional"  . Latex Rash   I reviewed her past medical history, social history, family history, and environmental history and no significant changes have been reported from her previous visit.  Review of Systems  Constitutional: Negative for appetite change, chills, fever and unexpected weight change.  HENT: Negative for congestion and rhinorrhea.   Eyes: Negative for itching.  Respiratory: Positive for cough, chest tightness and shortness of breath. Negative for wheezing.   Cardiovascular: Negative for chest pain.  Gastrointestinal: Negative for abdominal pain.  Genitourinary: Negative for difficulty urinating.  Skin: Positive for rash.  Allergic/Immunologic: Negative for food allergies.  Neurological: Positive for headaches.   Objective: BP 124/78 (BP Location: Right Arm, Patient Position: Sitting, Cuff Size: Normal)   Pulse 93   Temp (!) 97.3 F (36.3 C) (Temporal)   Ht 5' 4.76" (1.645 m)   Wt 182 lb (82.6 kg)   SpO2 98%   BMI 30.51 kg/m  Body mass index is 30.51 kg/m. Physical Exam  Constitutional: She is oriented to person, place, and time. She appears well-developed and well-nourished.  HENT:  Head: Normocephalic and atraumatic.  Right Ear: External ear normal.  Left Ear: External ear normal.  Nose: Nose normal.  Mouth/Throat:  Oropharynx is clear and moist.  Eyes: Conjunctivae and EOM are normal.  Cardiovascular: Normal rate, regular rhythm and normal heart sounds. Exam reveals no gallop and no friction rub.  No murmur heard. Pulmonary/Chest: Effort normal. She has no wheezes. She has no rales.  Decreased breath sounds throughout.  Abdominal: Soft.  Musculoskeletal:     Cervical back: Neck supple.  Neurological: She is alert and oriented to person, place, and time.  Skin: Skin is warm. No rash noted.  Psychiatric: She has a normal mood and affect. Her behavior is normal.  Nursing note and vitals reviewed.  Previous notes and tests were reviewed. The plan was reviewed with the patient/family, and all questions/concerned were addressed.  It was my pleasure to see Jane English  today and participate in her care. Please feel free to contact me with any questions or concerns.  Sincerely,  Rexene Alberts, DO Allergy & Immunology  [facility] office: [phone] [facility] office: [place] office: [phone]

## 2020-03-16 NOTE — Assessment & Plan Note (Signed)
Past history - Perennial rhino conjunctivitis symptoms for 30+ years with worsening in the summer. Tried Singulair, zyrtec, Astelin, Patanol with minimal improvement in symptoms. No recent allergy skin testing. Interim history - symptoms unchanged. Zyrtec caused dryness.  We discussed getting environmental allergy panel via bloodwork to do her respiratory status today.   Continue Singulair (montelukast) 10mg  daily.  Continue Astelin nasal spray 2 sprays per nostril 1-2 time a day as needed for runny nose.  Continue Fluticasone 1-2 sprays per nostril daily for nasal congestion.   Nasal saline spray (i.e., Simply Saline) or nasal saline lavage (i.e., NeilMed) is recommended as needed and prior to medicated nasal sprays.  May use olopatadine eye drop 1 drop in each eye twice daily as needed for itchy/watery eyes.   Try Allegra 180mg  once a day - samples given.

## 2020-03-16 NOTE — Assessment & Plan Note (Signed)
Improved with Vanicream hydrocortisone cream.  Take pictures of the rash.  See proper skin care below.  May use Vanicream hydrocortisone cream as needed - this is over the counter. Samples given.

## 2020-03-16 NOTE — Assessment & Plan Note (Signed)
Past history - Diagnosed with asthma over 30 years ago and using Symbicort 160 2 puffs twice a day and albuterol a few times a day. Patient has history of hospitalization with intubation in the past. CXR in March 2021 no active disease.  Interim history - finished prednisone taper and had another course with minimal benefit.   Today's spirometry showed: restrictive disease with no improvement in FEV1 post bronchodilator treatment. Clinically feeling slightly improved. . Will get CBC diff and IgE level to see if patient would meet criteria for biologics.   . Daily controller medication(s): Continue Symbicort 160 2 puffs twice a day with spacer and rinse mouth afterwards. o START Spiriva 1.79mcg 2 puff once a day. Sample given.  . Prior to physical activity: May use albuterol rescue inhaler 2 puffs 5 to 15 minutes prior to strenuous physical activities. Marland Kitchen Rescue medications: May use albuterol rescue inhaler 2 puffs or nebulizer every 4 to 6 hours as needed for shortness of breath, chest tightness, coughing, and wheezing. Monitor frequency of use.  . Repeat spirometry at next visit.

## 2020-03-16 NOTE — Patient Instructions (Addendum)
Asthma: . Get bloodwork to see if you meet criteria for an injectable asthma medication.  . Daily controller medication(s): Continue Symbicort 160 2 puffs twice a day with spacer and rinse mouth afterwards. o START Spiriva 1.39mcg 2 puff once a day. Sample given.  . Prior to physical activity: May use albuterol rescue inhaler 2 puffs 5 to 15 minutes prior to strenuous physical activities. Marland Kitchen Rescue medications: May use albuterol rescue inhaler 2 puffs or nebulizer every 4 to 6 hours as needed for shortness of breath, chest tightness, coughing, and wheezing. Monitor frequency of use.  . Asthma control goals:  o Full participation in all desired activities (may need albuterol before activity) o Albuterol use two times or less a week on average (not counting use with activity) o Cough interfering with sleep two times or less a month o Oral steroids no more than once a year o No hospitalizations  Rhinitis:  Continue Singulair (montelukast) 10mg  daily.  Continue Astelin nasal spray 2 sprays per nostril 1-2 time a day as needed for runny nose.  Continue Fluticasone 1-2 sprays per nostril daily for nasal congestion.   Nasal saline spray (i.e., Simply Saline) or nasal saline lavage (i.e., NeilMed) is recommended as needed and prior to medicated nasal sprays.  You may use both nasal sprays back to back.   May use olopatadine eye drop 1 drop in each eye twice daily as needed for itchy/watery eyes.   Try Allegra 180mg  once a day - samples given.   Rash:  Take pictures of the rash.  See proper skin care below.  May use vanicream hydrocortisone cream as needed - this is over the counter. Samples given.   Follow up in 4 weeks or sooner if needed.    Skin care recommendations  Bath time: . Always use lukewarm water. AVOID very hot or cold water. Marland Kitchen Keep bathing time to 5-10 minutes. . Do NOT use bubble bath. . Use a mild soap and use just enough to wash the dirty areas. . Do NOT scrub  skin vigorously.  . After bathing, pat dry your skin with a towel. Do NOT rub or scrub the skin.  Moisturizers and prescriptions:  . ALWAYS apply moisturizers immediately after bathing (within 3 minutes). This helps to lock-in moisture. . Use the moisturizer several times a day over the whole body. Kermit Balo summer moisturizers include: Aveeno, CeraVe, Cetaphil. Kermit Balo winter moisturizers include: Aquaphor, Vaseline, Cerave, Cetaphil, Eucerin, Vanicream. . When using moisturizers along with medications, the moisturizer should be applied about one hour after applying the medication to prevent diluting effect of the medication or moisturize around where you applied the medications. When not using medications, the moisturizer can be continued twice daily as maintenance.  Laundry and clothing: . Avoid laundry products with added color or perfumes. . Use unscented hypo-allergenic laundry products such as Tide free, Cheer free & gentle, and All free and clear.  . If the skin still seems dry or sensitive, you can try double-rinsing the clothes. . Avoid tight or scratchy clothing such as wool. . Do not use fabric softeners or dyer sheets.

## 2020-03-17 ENCOUNTER — Other Ambulatory Visit: Payer: Self-pay

## 2020-03-17 ENCOUNTER — Encounter: Payer: Medicare HMO | Attending: Physical Medicine and Rehabilitation | Admitting: Physical Medicine and Rehabilitation

## 2020-03-17 VITALS — BP 136/80 | HR 101 | Temp 98.3°F | Ht 64.76 in | Wt 181.0 lb

## 2020-03-17 DIAGNOSIS — Z79891 Long term (current) use of opiate analgesic: Secondary | ICD-10-CM | POA: Insufficient documentation

## 2020-03-17 DIAGNOSIS — M961 Postlaminectomy syndrome, not elsewhere classified: Secondary | ICD-10-CM

## 2020-03-17 DIAGNOSIS — M19042 Primary osteoarthritis, left hand: Secondary | ICD-10-CM | POA: Diagnosis not present

## 2020-03-17 DIAGNOSIS — G894 Chronic pain syndrome: Secondary | ICD-10-CM | POA: Insufficient documentation

## 2020-03-17 DIAGNOSIS — M7918 Myalgia, other site: Secondary | ICD-10-CM | POA: Insufficient documentation

## 2020-03-17 DIAGNOSIS — M19041 Primary osteoarthritis, right hand: Secondary | ICD-10-CM | POA: Insufficient documentation

## 2020-03-17 DIAGNOSIS — M5136 Other intervertebral disc degeneration, lumbar region: Secondary | ICD-10-CM | POA: Diagnosis not present

## 2020-03-17 DIAGNOSIS — Z5181 Encounter for therapeutic drug level monitoring: Secondary | ICD-10-CM | POA: Insufficient documentation

## 2020-03-17 MED ORDER — OXYCODONE HCL 5 MG PO TABS: 10.0000 mg | ORAL_TABLET | Freq: Two times a day (BID) | ORAL | 0 refills | Status: DC

## 2020-03-17 NOTE — Progress Notes (Signed)
Subjective:    Patient ID: Jane English, female    DOB: 1971/09/11, 49 y.o.   MRN: [id number]  HPI  49 year old woman with chronic pain after failed back surgery. She would like to be able to spend more time with her grandchildren, drive. Her pain was better controlled in the past but when she switched doctors her pain increased. Pain is now better controlled again. Currently rates as 8 out of 10 compared to 10/10 prior office visit.   I have previously administered viscosupplementation injection to painful knee with moderate relief.  Her pain is now worst in the lower back and neck and she has tried Voltaren gel. She does have shooting pain into her lower legs, as well as pain in bilateral knees.    She has an allergy to Gabapentin. She was prescribed steroids to help reduce inflammatory pain but she does not like taking them because they make her gain weight.    Her pain and function have been improved with Roxicodone. She has been taking 2 tablets twice per day and has been able to engage in her daily ADLs. She is currently undergoing a divorce and this is a major source of stress for her, though she has a great support system of her 4 children and 2 grandchildren, cousin, and best friend. She also is thrilled about the birth of her 9rd grandson this past month and shows me pictures of him. She feels better able to handle her separation from her husband now. Her sources of joy are spending time with her family, especially her grandchildren, watching TV.   She has been going up and down her 17 stairs for exercise every day except when it rains outside. She has had a 11 lbs weight loss since I saw her 2 months ago! Current weight is 181 lbs.   She had spinal cord stimulator placed in 2007 and it has been nonfunctional for some time, she was told possibly due to scar tissue. She has since seen Dr. Clydell Hakim and was told a keloid likely formed around her neurostimulator and it was best to leave  as is.    She has followed with rheumatology and had labs and XRs done. She describes bilateral and, shoulder, and knee pain,worst in her right knee. She has benefitted from injection to her knee in the past and was interested in OT for her hand arthritis, but it was making her pain worse so she discontinued it.   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   Pain Inventory Average Pain 8 Pain Right Now 9 My pain is sharp, burning, dull, stabbing, tingling and aching  In the last 24 hours, has pain interfered with the following? General activity 9 Relation with others 9 Enjoyment of life 9 What TIME of day is your pain at its worst? varies Sleep (in general) Fair  Pain is worse with: walking, sitting, standing and some activites Pain improves with: heat/ice and medication Relief from Meds: 8  Mobility how many minutes can you walk? 2 ability to climb steps?  yes do you drive?  yes  Function employed # of hrs/week 21 disabled: date disabled 2010  Neuro/Psych numbness tingling spasms  Prior Studies Any changes since last visit?  no  Physicians involved in your care Pulmonologist, Opthalmologist   Family History  Problem Relation Age of Onset  . Lung cancer Father   . Stomach cancer  Father   . Esophageal cancer Father   . Alcohol abuse Father   . Mental illness Father   . Diabetes Sister   . Hypertension Sister   . Bipolar disorder Sister   . Schizophrenia Sister   . Diabetes Sister   . Alcohol abuse Brother   . Hypertension Brother   . Kidney disease Brother   . Diabetes Brother   . Drug abuse Brother   . Mental illness Brother   . Alcohol abuse Brother   . Alcohol abuse Brother   . Hypertension Brother   . Diabetes Brother   . Alcohol abuse Brother   . Physical abuse Mother   . Alcohol abuse Mother   . Cirrhosis Mother   . Mental illness Brother        in [place]  . Drug abuse Sister   . HIV Sister   .  Alcohol abuse Brother   . Pneumonia Sister        died as a baby  . Alcohol abuse Brother   . Bipolar disorder Brother   . Bipolar disorder Daughter   . Bipolar disorder Son   . Bipolar disorder Son   . Hypertension Brother   . Bipolar disorder Brother   . Drug abuse Brother   . Alcohol abuse Brother   . Bipolar disorder Brother   . ADD / ADHD Neg Hx   . Anxiety disorder Neg Hx   . Dementia Neg Hx   . Depression Neg Hx   . OCD Neg Hx   . Seizures Neg Hx   . Paranoid behavior Neg Hx   . Colon cancer Neg Hx    Social History   Socioeconomic History  . Marital status: Married    Spouse name: Not on file  . Number of children: 4  . Years of education: Not on file  . Highest education level: Not on file  Occupational History  . Not on file  Tobacco Use  . Smoking status: Never Smoker  . Smokeless tobacco: Never Used  Substance and Sexual Activity  . Alcohol use: No  . Drug use: No  . Sexual activity: Yes    Partners: Male    Birth control/protection: Surgical    Comment: tubal  Other Topics Concern  . Not on file  Social History Narrative  . Not on file   Social Determinants of Health   Financial Resource Strain:   . Difficulty of Paying Living Expenses:   Food Insecurity:   . Worried About Charity fundraiser in the Last Year:   . Arboriculturist in the Last Year:   Transportation Needs:   . Film/video editor (Medical):   Marland Kitchen Lack of Transportation (Non-Medical):   Physical Activity:   . Days of Exercise per Week:   . Minutes of Exercise per Session:   Stress:   . Feeling of Stress :   Social Connections:   . Frequency of Communication with Friends and Family:   . Frequency of Social Gatherings with Friends and Family:   . Attends Religious Services:   . Active Member of Clubs or Organizations:   . Attends Archivist Meetings:   Marland Kitchen Marital Status:    Past Surgical History:  Procedure Laterality Date  . ANTERIOR CERVICAL DECOMP/DISCECTOMY  FUSION  07/07/2012   Procedure: ANTERIOR CERVICAL DECOMPRESSION/DISCECTOMY FUSION 2 LEVELS;  Surgeon: Floyce Stakes, MD;  Location: [facility];  Service: Neurosurgery;  Laterality: N/A;  Cervical four-five,  five - six  Anterior cervical decompression/diskectomy/fusion/plate  . APPENDECTOMY  1986  . BOWEL RESECTION N/A 07/29/2013   Procedure: serosal repair;  Surgeon: Adin Hector, MD;  Location: [facility];  Service: General;  Laterality: N/A;  . CARPAL TUNNEL RELEASE Bilateral   . LAPAROSCOPY N/A 07/29/2013   Procedure: diagnostic laporoscopy;  Surgeon: Adin Hector, MD;  Location: [facility];  Service: General;  Laterality: N/A;  . LAPAROSCOPY N/A 08/16/2013   Procedure: LAPAROSCOPY DIAGNOSTIC/LYSIS OF ADHESIONS;  Surgeon: Adin Hector, MD;  Location: [facility];  Service: General;  Laterality: N/A;  . LAPAROTOMY N/A 08/16/2013   Procedure: EXPLORATORY LAPAROTOMY/SMALL BOWEL RESECTION (JEJUNUM);  Surgeon: Adin Hector, MD;  Location: [facility];  Service: General;  Laterality: N/A;  . LUMBAR SPINE SURGERY  2010   x 3  . LYSIS OF ADHESION  2003   Dr. Irving Shows  . LYSIS OF ADHESION N/A 07/29/2013   Procedure: LYSIS OF ADHESION;  Surgeon: Adin Hector, MD;  Location: [facility];  Service: General;  Laterality: N/A;  . OOPHORECTOMY    . PARTIAL HYSTERECTOMY  1990s?   [facility], [place]  . SPINAL CORD STIMULATOR IMPLANT    . TRIGGER FINGER RELEASE  2009   right pinkie finger  . TUBAL LIGATION  1994   Past Medical History:  Diagnosis Date  . Anemia   . Asthma   . Asthma flare 04/09/2013  . Back pain   . Bronchitis   . Chronic abdominal pain   . Chronic constipation   . Constipation due to opioid therapy   . Depression   . Diabetes mellitus without complication ([place])   . Diabetes mellitus, type II ([place])   . DVT (deep venous thrombosis) ([place]) 2010  . GERD (gastroesophageal reflux disease)   . Heart murmur    no cardiologist  . Helicobacter pylori gastritis 06/11/2013   Colonoscopy Dr.  Hilarie Fredrickson  . Hypertension   . IBS (irritable bowel syndrome)   . Migraine headache   . Neuropathy   . Obesity   . Obsessive-compulsive disorder   . PSYCHOTIC D/O W/HALLUCINATIONS CONDS CLASS ELSW 03/04/2010   Qualifier: Diagnosis of  By: Moshe Cipro MD, Joycelyn Schmid    . PTSD (post-traumatic stress disorder)   . SBO (small bowel obstruction) ([place]) 08/09/2013  . Seasonal allergies 12/10/2012  . Seizures ([place])   . Shortness of breath    BP 136/80   Pulse (!) 101   Temp 98.3 F (36.8 C)   Ht 5' 4.76" (1.645 m)   Wt 181 lb (82.1 kg)   SpO2 98%   BMI 30.34 kg/m   Opioid Risk Score:   Fall Risk Score:  `1  Depression screen PHQ 2/9  Depression screen [facility] 2/9 02/23/2020 02/18/2020 02/01/2020 12/28/2019 10/27/2019 09/27/2019 09/23/2019  Decreased Interest 0 0 0 0 0 0 2  Down, Depressed, Hopeless 0 0 0 0 0 0 2  PHQ - 2 Score 0 0 0 0 0 0 4  Altered sleeping - - - 0 - 1 2  Tired, decreased energy - - - 0 - 1 2  Change in appetite - - - 0 - 0 2  Feeling bad or failure about yourself  - - - 0 - 0 1  Trouble concentrating - - - 0 - 0 2  Moving slowly or fidgety/restless - - - 0 - 0 0  Suicidal thoughts - - - 0 - 0 0  PHQ-9 Score - - - 0 - 2 13  Difficult  doing work/chores - - - Not difficult at all - Not difficult at all Somewhat difficult  Some encounter information is confidential and restricted. Go to Review Flowsheets activity to see all data.  Some recent data might be hidden    Review of Systems  Constitutional: Positive for diaphoresis.  Respiratory: Positive for shortness of breath and wheezing.   Neurological: Positive for numbness.       Spasms Tingling   All other systems reviewed and are negative.      Objective:   Physical Exam Gen: no distress, normal appearing, evidence of weight loss.  HEENT: oral mucosa pink and moist, NCAT Cardio: Reg rate Chest: normal effort, normal rate of breathing Abd: soft, non-distended Ext: no edema Skin: neurostimulator palpable in right  lower back Neuro: AOx3 Follows commands.  MSK: Normal gait and tandem walk. Full ROM of lumbar spine on flexion, limited in extension. Both flexion and extension result in pain, but less than last visit. +Slump test on the left, which produces pain down the leg to the calf in the S1 distribution.  +bilateral hand tremor is much improved to resolved.  Psych: pleasant, normal affect      Assessment & Plan:  49 year old woman with chronic pain after failed back surgery.   --She has severe pain, worse in her lower back, cervical spine, and knee. Also has obesity which contributes to her pain. Pain has continued to improve with medication and after Synvisc-One injection to knee.    -Reviewed MRI lumbar spine from 2013 with shows the following: 1.  Stable appearance of the lumbar spine status post L5-S1 PLIF. 2.  No significant adjacent segment disease, disc herniation, acquired spinal stenosis or nerve root encroachment. Mild facet degenerative changes inferiorly are stable. 3.  No abnormal intradural enhancement.   --She has been on Oxycodone in the past and had functional benefits. She was able to walk more, about 2 miles per day. She as tried surgery, injections, and neuropathic pain medications without relief or with negative side effects. After UDS and contract, I prescribed Roxicodone 10mg  BID. Discussed that our goals are functional improvement and not long term dependence on oxycodone. Discussed creating a daily exercise regimen for pain control and overall health. She has been doing her 17 stairs daily and has lost 11 lbs in 2 months!   --She has seen Dr. Clydell Hakim at [facility] and told that there is likely keloid formation around her inactive pain stimulation system, so it would be best to leave as is.     --Life stressors. Discussed at length her stress regarding her divorce, as well as her sources of support and joy. Encouraged social interaction and physical activity. She shares  with me pictures of her children and newborn grandson    All questions were encouraged and answered. Follow up with me in 1 month.

## 2020-03-18 ENCOUNTER — Encounter: Payer: Self-pay | Admitting: Physical Medicine and Rehabilitation

## 2020-03-21 ENCOUNTER — Ambulatory Visit (INDEPENDENT_AMBULATORY_CARE_PROVIDER_SITE_OTHER): Payer: Medicare HMO | Admitting: Psychologist

## 2020-03-21 DIAGNOSIS — F603 Borderline personality disorder: Secondary | ICD-10-CM | POA: Diagnosis not present

## 2020-03-21 DIAGNOSIS — F4310 Post-traumatic stress disorder, unspecified: Secondary | ICD-10-CM

## 2020-03-23 LAB — TOXASSURE SELECT,+ANTIDEPR,UR

## 2020-03-30 ENCOUNTER — Telehealth: Payer: Self-pay | Admitting: *Deleted

## 2020-03-30 NOTE — Telephone Encounter (Signed)
Urine drug screen for this encounter is consistent for prescribed medication 

## 2020-04-05 ENCOUNTER — Other Ambulatory Visit: Payer: Self-pay | Admitting: Family Medicine

## 2020-04-05 ENCOUNTER — Ambulatory Visit: Payer: Medicare HMO | Admitting: Psychologist

## 2020-04-05 ENCOUNTER — Other Ambulatory Visit: Payer: Self-pay | Admitting: Internal Medicine

## 2020-04-06 ENCOUNTER — Ambulatory Visit (INDEPENDENT_AMBULATORY_CARE_PROVIDER_SITE_OTHER): Payer: Medicare HMO | Admitting: Family Medicine

## 2020-04-06 ENCOUNTER — Encounter: Payer: Self-pay | Admitting: Family Medicine

## 2020-04-06 ENCOUNTER — Other Ambulatory Visit: Payer: Self-pay | Admitting: "Endocrinology

## 2020-04-06 ENCOUNTER — Other Ambulatory Visit: Payer: Self-pay

## 2020-04-06 VITALS — BP 120/82 | HR 66 | Temp 97.3°F | Resp 15 | Ht 66.0 in | Wt 182.0 lb

## 2020-04-06 DIAGNOSIS — J4550 Severe persistent asthma, uncomplicated: Secondary | ICD-10-CM | POA: Diagnosis not present

## 2020-04-06 DIAGNOSIS — E663 Overweight: Secondary | ICD-10-CM | POA: Diagnosis not present

## 2020-04-06 DIAGNOSIS — F411 Generalized anxiety disorder: Secondary | ICD-10-CM | POA: Diagnosis not present

## 2020-04-06 DIAGNOSIS — F5104 Psychophysiologic insomnia: Secondary | ICD-10-CM | POA: Diagnosis not present

## 2020-04-06 DIAGNOSIS — M62830 Muscle spasm of back: Secondary | ICD-10-CM | POA: Diagnosis not present

## 2020-04-06 DIAGNOSIS — R7301 Impaired fasting glucose: Secondary | ICD-10-CM

## 2020-04-06 DIAGNOSIS — I10 Essential (primary) hypertension: Secondary | ICD-10-CM

## 2020-04-06 DIAGNOSIS — J310 Chronic rhinitis: Secondary | ICD-10-CM | POA: Diagnosis not present

## 2020-04-06 MED ORDER — ALBUTEROL SULFATE (2.5 MG/3ML) 0.083% IN NEBU: 2.5000 mg | INHALATION_SOLUTION | Freq: Four times a day (QID) | RESPIRATORY_TRACT | 1 refills | Status: DC | PRN

## 2020-04-06 MED ORDER — ALPRAZOLAM 0.5 MG PO TABS
0.5000 mg | ORAL_TABLET | Freq: Every day | ORAL | 3 refills | Status: DC
Start: 2020-04-06 — End: 2020-07-21

## 2020-04-06 MED ORDER — BUDESONIDE-FORMOTEROL FUMARATE 160-4.5 MCG/ACT IN AERO: 2.0000 | INHALATION_SPRAY | Freq: Two times a day (BID) | RESPIRATORY_TRACT | 3 refills | Status: DC

## 2020-04-06 MED ORDER — PANTOPRAZOLE SODIUM 40 MG PO TBEC: 40.0000 mg | DELAYED_RELEASE_TABLET | Freq: Every day | ORAL | 1 refills | Status: DC

## 2020-04-06 MED ORDER — TRIAMTERENE-HCTZ 37.5-25 MG PO TABS: ORAL_TABLET | ORAL | 1 refills | Status: DC

## 2020-04-06 MED ORDER — CYCLOBENZAPRINE HCL 10 MG PO TABS
ORAL_TABLET | ORAL | 0 refills | Status: DC
Start: 2020-04-06 — End: 2020-12-12

## 2020-04-06 MED ORDER — ALBUTEROL SULFATE HFA 108 (90 BASE) MCG/ACT IN AERS: INHALATION_SPRAY | RESPIRATORY_TRACT | 4 refills | Status: DC

## 2020-04-06 MED ORDER — PHENTERMINE HCL 37.5 MG PO TABS: ORAL_TABLET | ORAL | 1 refills | Status: DC

## 2020-04-06 NOTE — Patient Instructions (Addendum)
F/U in 4 months, call if you need me before  Please get cmp and EGFr, magnesium level , hBA1C today  Medications are sent in as discussed and refills will be addressed by nursing  If still having uncontrolled muscle spasms when you next see your Pain specialist , please  Let the Doc know  It is important that you exercise regularly at least 30 minutes 5 times a week. If you develop chest pain, have severe difficulty breathing, or feel very tired, stop exercising immediately and seek medical attention  Think about what you will eat, plan ahead. Choose " clean, green, fresh or frozen" over canned, processed or packaged foods which are more sugary, salty and fatty. 70 to 75% of food eaten should be vegetables and fruit. Three meals at set times with snacks allowed between meals, but they must be fruit or vegetables. Aim to eat over a 12 hour period , example 7 am to 7 pm, and STOP after  your last meal of the day. Drink water,generally about 64 ounces per day, no other drink is as healthy. Fruit juice is best enjoyed in a healthy way, by EATING the fruit. Congrats on weight loss, keep it up  Thanks for choosing [facility], we consider it a privelige to serve you.

## 2020-04-07 LAB — COMPREHENSIVE METABOLIC PANEL
ALT: 18 IU/L (ref 0–32)
AST: 13 IU/L (ref 0–40)
Albumin/Globulin Ratio: 1.6 (ref 1.2–2.2)
Albumin: 4.5 g/dL (ref 3.8–4.9)
Alkaline Phosphatase: 77 IU/L (ref 48–121)
BUN/Creatinine Ratio: 12 (ref 9–23)
BUN: 13 mg/dL (ref 6–24)
Bilirubin Total: 0.2 mg/dL (ref 0.0–1.2)
CO2: 21 mmol/L (ref 20–29)
Calcium: 9.2 mg/dL (ref 8.7–10.2)
Chloride: 105 mmol/L (ref 96–106)
Creatinine, Ser: 1.05 mg/dL — ABNORMAL HIGH (ref 0.57–1.00)
GFR calc Af Amer: 71 mL/min/{1.73_m2} (ref >59)
GFR calc non Af Amer: 62 mL/min/{1.73_m2} (ref >59)
Globulin, Total: 2.9 g/dL (ref 1.5–4.5)
Glucose: 108 mg/dL — ABNORMAL HIGH (ref 65–99)
Potassium: 3.8 mmol/L (ref 3.5–5.2)
Sodium: 141 mmol/L (ref 134–144)
Total Protein: 7.4 g/dL (ref 6.0–8.5)

## 2020-04-07 LAB — SPECIMEN STATUS REPORT

## 2020-04-07 LAB — HGB A1C W/O EAG: Hgb A1c MFr Bld: 6.3 % — ABNORMAL HIGH (ref 4.8–5.6)

## 2020-04-07 LAB — MAGNESIUM: Magnesium: 1.7 mg/dL (ref 1.6–2.3)

## 2020-04-10 ENCOUNTER — Encounter: Payer: Self-pay | Admitting: Family Medicine

## 2020-04-10 ENCOUNTER — Telehealth: Payer: Self-pay | Admitting: Family Medicine

## 2020-04-10 DIAGNOSIS — M62830 Muscle spasm of back: Secondary | ICD-10-CM | POA: Insufficient documentation

## 2020-04-10 LAB — ALLERGENS W/TOTAL IGE AREA 2
Alternaria Alternata IgE: <0.1 kU/L
Aspergillus Fumigatus IgE: <0.1 kU/L
Bermuda Grass IgE: <0.1 kU/L
Cat Dander IgE: 5.54 kU/L — AB
Cedar, Mountain IgE: <0.1 kU/L
Cladosporium Herbarum IgE: <0.1 kU/L
Cockroach, German IgE: <0.1 kU/L
Common Silver Birch IgE: <0.1 kU/L
Cottonwood IgE: <0.1 kU/L
D Farinae IgE: <0.1 kU/L
D Pteronyssinus IgE: <0.1 kU/L
Dog Dander IgE: 11.6 kU/L — AB
Elm, American IgE: <0.1 kU/L
IgE (Immunoglobulin E), Serum: 119 IU/mL (ref 6–495)
Johnson Grass IgE: <0.1 kU/L
Maple/Box Elder IgE: 0.9 kU/L — AB
Mouse Urine IgE: <0.1 kU/L
Oak, White IgE: <0.1 kU/L
Pecan, Hickory IgE: <0.1 kU/L
Penicillium Chrysogen IgE: <0.1 kU/L
Pigweed, Rough IgE: <0.1 kU/L
Ragweed, Short IgE: <0.1 kU/L
Sheep Sorrel IgE Qn: <0.1 kU/L
Timothy Grass IgE: <0.1 kU/L
White Mulberry IgE: <0.1 kU/L

## 2020-04-10 LAB — CBC WITH DIFFERENTIAL/PLATELET
Basophils Absolute: 0.1 10*3/uL (ref 0.0–0.2)
Basos: 1 %
EOS (ABSOLUTE): 0.5 10*3/uL — ABNORMAL HIGH (ref 0.0–0.4)
Eos: 7 %
Hematocrit: 38.7 % (ref 34.0–46.6)
Hemoglobin: 13.3 g/dL (ref 11.1–15.9)
Immature Grans (Abs): 0 10*3/uL (ref 0.0–0.1)
Immature Granulocytes: 0 %
Lymphocytes Absolute: 2.8 10*3/uL (ref 0.7–3.1)
Lymphs: 39 %
MCH: 30.8 pg (ref 26.6–33.0)
MCHC: 34.4 g/dL (ref 31.5–35.7)
MCV: 90 fL (ref 79–97)
Monocytes Absolute: 0.7 10*3/uL (ref 0.1–0.9)
Monocytes: 9 %
Neutrophils Absolute: 3.1 10*3/uL (ref 1.4–7.0)
Neutrophils: 44 %
Platelets: 313 10*3/uL (ref 150–450)
RBC: 4.32 x10E6/uL (ref 3.77–5.28)
RDW: 13.1 % (ref 11.7–15.4)
WBC: 7.2 10*3/uL (ref 3.4–10.8)

## 2020-04-10 NOTE — Assessment & Plan Note (Signed)
Controlled, no change in medication DASH diet and commitment to daily physical activity for a minimum of 30 minutes discussed and encouraged, as a part of hypertension management. The importance of attaining a healthy weight is also discussed.  BP/Weight 04/06/2020 03/17/2020 03/16/2020 02/23/2020 02/18/2020 02/01/2020 0/96/2836  Systolic BP 629 476 546 503 546 568 127  Diastolic BP 82 80 78 81 85 78 66  Wt. (Lbs) 182 181 182 185.8 183.2 189.6 193  BMI 29.38 30.34 30.51 29.99 29.57 30.6 31.15  Some encounter information is confidential and restricted. Go to Review Flowsheets activity to see all data.

## 2020-04-10 NOTE — Telephone Encounter (Signed)
Please contact pt and pharmacy. Xanax dispensed on 06/2 and 6/3, different strengths and doses. I discussed at visit with pt, she actually is taking 0.5 mg dose once DAILY, SO SHE SHOULD NO LONGER BE GETTING XANAX 1 MG tab which was dispensed also. Please first call pt and explain the error, so she now has at home total of 3 months xanax ( 30 of the 1 mg tab, and 30 of the 0.5 mg tab, she will break the 1 mg in hlf as she had been  Doing) PLEASE speak and verify with pt, let me know if there is discrepancy. After this please dirctly contact pharmacy, and also fax order to discontinue xanax 1 mg tablet effective 04/10/2020  Questions pls ask , also please send back a message when completed. Thanks, pls address today

## 2020-04-10 NOTE — Assessment & Plan Note (Signed)
Increased and uncontrolled short course of flexeril and f/u with pain management

## 2020-04-10 NOTE — Assessment & Plan Note (Signed)
Increased and uncontrolled pain and spasm, needs to address with pain management, short course of flexeril prescribed

## 2020-04-10 NOTE — Progress Notes (Signed)
RITAL CAVEY     MRN: [id number]      DOB: 03-08-1971   HPI Ms. Jane English is here for follow up and re-evaluation of chronic medical conditions, medication management and review of any available recent lab and radiology data.  Preventive health is updated, specifically  Cancer screening and Immunization.   Questions or concerns regarding consultations or procedures which the PT has had in the interim are  addressed. The PT denies any adverse reactions to current medications since the last visit.  C/o increased and uncontrolled back pain and spasm, no response to medication adjustment by pain management, has increased dose of amitriptaline Improved mental health , and now working full time, happy with progress of one son in particular, and has 5 grandchildren which all make her happy. Doing well with phentermine as she works on weight loss  ROS Denies recent fever or chills. Denies sinus pressure, nasal congestion, ear pain or sore throat. Denies chest congestion, productive cough or wheezing. Denies chest pains, palpitations and leg swelling Denies abdominal pain, nausea, vomiting,diarrhea or constipation.   Denies dysuria, frequency, hesitancy or incontinence.  Denies headaches, seizures, c/o lower extremity  tingling. Denies uncontrolled  depression, anxiety or insomnia. Denies skin break down or rash.   PE  BP 120/82   Pulse 66   Temp (!) 97.3 F (36.3 C) (Temporal)   Resp 15   Ht 5\' 6"  (1.676 m)   Wt 182 lb (82.6 kg)   SpO2 97%   BMI 29.38 kg/m   Patient alert and oriented and in no cardiopulmonary distress.  HEENT: No facial asymmetry, EOMI,     Neck supple .  Chest: Clear to auscultation bilaterally.  CVS: S1, S2 no murmurs, no S3.Regular rate.  ABD: Soft non tender.   Ext: No edema  MS: decreased  ROM spine, adequate in shoulders, hips and knees.  Skin: Intact, no ulcerations or rash noted.  Psych: Good eye contact, normal affect. Memory intact not anxious  or depressed appearing.  CNS: CN 2-12 intact, power,  normal throughout.no focal deficits noted.   Assessment & Plan  Essential hypertension Controlled, no change in medication DASH diet and commitment to daily physical activity for a minimum of 30 minutes discussed and encouraged, as a part of hypertension management. The importance of attaining a healthy weight is also discussed.  BP/Weight 04/06/2020 03/17/2020 03/16/2020 02/23/2020 02/18/2020 02/01/2020 5/32/9924  Systolic BP 268 341 962 229 798 921 194  Diastolic BP 82 80 78 81 85 78 66  Wt. (Lbs) 182 181 182 185.8 183.2 189.6 193  BMI 29.38 30.34 30.51 29.99 29.57 30.6 31.15  Some encounter information is confidential and restricted. Go to Review Flowsheets activity to see all data.       GAD (generalized anxiety disorder) Controlled, no change in medication   Insomnia Sleep hygiene reviewed and written information offered also. Prescription sent for  medication needed.   Pruritic rash Increased and uncontrolled pain and spasm, needs to address with pain management, short course of flexeril prescribed  Back spasm Increased and uncontrolled short course of flexeril and f/u with pain management   Overweight (BMI 25.0-29.9)  Patient re-educated about  the importance of commitment to a  minimum of 150 minutes of exercise per week as able.  The importance of healthy food choices with portion control discussed, as well as eating regularly and within a 12 hour window most days. The need to choose "clean , green" food 50 to 75% of the  time is discussed, as well as to make water the primary drink and set a goal of 64 ounces water daily.    Weight /BMI 04/06/2020 03/17/2020 03/16/2020  WEIGHT 182 lb 181 lb 182 lb  HEIGHT 5\' 6"  5' 4.76" 5' 4.764"  BMI 29.38 kg/m2 30.34 kg/m2 30.51 kg/m2  Some encounter information is confidential and restricted. Go to Review Flowsheets activity to see all data.   Improving, continue half  phentermine daily

## 2020-04-10 NOTE — Assessment & Plan Note (Signed)
  Patient re-educated about  the importance of commitment to a  minimum of 150 minutes of exercise per week as able.  The importance of healthy food choices with portion control discussed, as well as eating regularly and within a 12 hour window most days. The need to choose "clean , green" food 50 to 75% of the time is discussed, as well as to make water the primary drink and set a goal of 64 ounces water daily.    Weight /BMI 04/06/2020 03/17/2020 03/16/2020  WEIGHT 182 lb 181 lb 182 lb  HEIGHT 5\' 6"  5' 4.76" 5' 4.764"  BMI 29.38 kg/m2 30.34 kg/m2 30.51 kg/m2  Some encounter information is confidential and restricted. Go to Review Flowsheets activity to see all data.   Improving, continue half phentermine daily

## 2020-04-10 NOTE — Assessment & Plan Note (Signed)
Controlled, no change in medication  

## 2020-04-10 NOTE — Assessment & Plan Note (Signed)
Sleep hygiene reviewed and written information offered also. Prescription sent for  medication needed.  

## 2020-04-11 NOTE — Telephone Encounter (Signed)
Patient aware.

## 2020-04-11 NOTE — Telephone Encounter (Signed)
Called patient and left message for them to return call at the office   

## 2020-04-17 ENCOUNTER — Encounter: Payer: Medicare HMO | Attending: Physical Medicine and Rehabilitation | Admitting: Physical Medicine and Rehabilitation

## 2020-04-17 ENCOUNTER — Encounter: Payer: Self-pay | Admitting: Physical Medicine and Rehabilitation

## 2020-04-17 ENCOUNTER — Other Ambulatory Visit: Payer: Self-pay | Admitting: Family Medicine

## 2020-04-17 ENCOUNTER — Other Ambulatory Visit: Payer: Self-pay

## 2020-04-17 VITALS — BP 120/82 | HR 66 | Temp 97.3°F | Ht 66.0 in | Wt 182.0 lb

## 2020-04-17 DIAGNOSIS — M5136 Other intervertebral disc degeneration, lumbar region: Secondary | ICD-10-CM | POA: Insufficient documentation

## 2020-04-17 DIAGNOSIS — M94261 Chondromalacia, right knee: Secondary | ICD-10-CM

## 2020-04-17 DIAGNOSIS — M961 Postlaminectomy syndrome, not elsewhere classified: Secondary | ICD-10-CM | POA: Insufficient documentation

## 2020-04-17 DIAGNOSIS — M7061 Trochanteric bursitis, right hip: Secondary | ICD-10-CM | POA: Diagnosis not present

## 2020-04-17 DIAGNOSIS — G894 Chronic pain syndrome: Secondary | ICD-10-CM | POA: Diagnosis not present

## 2020-04-17 DIAGNOSIS — M7918 Myalgia, other site: Secondary | ICD-10-CM | POA: Insufficient documentation

## 2020-04-17 DIAGNOSIS — Z5181 Encounter for therapeutic drug level monitoring: Secondary | ICD-10-CM | POA: Insufficient documentation

## 2020-04-17 DIAGNOSIS — M19042 Primary osteoarthritis, left hand: Secondary | ICD-10-CM | POA: Insufficient documentation

## 2020-04-17 DIAGNOSIS — M5090 Cervical disc disorder, unspecified, unspecified cervical region: Secondary | ICD-10-CM | POA: Diagnosis not present

## 2020-04-17 DIAGNOSIS — Z79891 Long term (current) use of opiate analgesic: Secondary | ICD-10-CM | POA: Insufficient documentation

## 2020-04-17 DIAGNOSIS — M19041 Primary osteoarthritis, right hand: Secondary | ICD-10-CM | POA: Insufficient documentation

## 2020-04-17 MED ORDER — OXYCODONE HCL 5 MG PO TABS: 10.0000 mg | ORAL_TABLET | Freq: Two times a day (BID) | ORAL | 0 refills | Status: DC

## 2020-04-17 MED ORDER — MAGNESIUM 400 MG PO CAPS: 1.0000 | ORAL_CAPSULE | Freq: Every day | ORAL | 10 refills | Status: DC

## 2020-04-17 NOTE — Progress Notes (Signed)
Follow Up Note  RE: Jane English MRN: [id number] DOB: February 07, 1971 Date of Office Visit: 04/18/2020  Referring provider: Fayrene Helper, MD Primary care provider: Fayrene Helper, MD  Chief Complaint: Asthma  History of Present Illness: I had the pleasure of seeing Jane English for a follow up visit at the [facility] on 04/18/2020. She is a 49 y.o. female, who is being followed for asthma, chronic rhinitis and pruritic rash. Her previous allergy office visit was on 03/16/2020 with Dr. Maudie Mercury. Today is a regular follow up visit.  Asthma: The past 2 weeks patient started to have issues with her breathing with shortness of breath and felt suffocated. Currently using albuterol nebulizer 1-3 times a day with good benefit.  Has been taking Symbicort 160 2 puffs BID, Spiriva 2 puffs once a day, Qvar 40 as needed.   No ER/urgent care visit or prednisone use.  Does think the Spiriva has been helping.   Chronic rhinitis 1 dog at home x 1 month ago and that may be aggravating her asthma.   Using fluticasone and Astelin as needed.   Pruritic rash Improved with Vanicream hydrocortisone cream.  Assessment and Plan: Jane English is a 49 y.o. female with: Not well controlled severe persistent asthma Past history - Diagnosed with asthma over 30 years ago and using Symbicort 160 2 puffs twice a day and albuterol a few times a day. Patient has history of hospitalization with intubation in the past. CXR in March 2021 no active disease.  Interim history - some benefit with the addition of Spiriva but now has a dog and using albuterol nebulizer 1-3 times a day with good benefit.  ACT score 10.  Today's spirometry showed some restriction. . Start Nucala injections every 4 weeks - prefers to self administer at home. . Discussed risks and benefits and first sample dose given in the office today. . Will start paperwork to get authorization.  . Use your albuterol nebulizer machine  twice a day for the next 1 week prior to using your inhalers. . Continue with Qvar 40 2 puffs twice a day for the next 2 weeks.  . Daily controller medication(s): Continue Symbicort 160 2 puffs twice a day with spacer and rinse mouth afterwards. o Continue Spiriva 1.80mg 2 puff once a day.  . Prior to physical activity: May use albuterol rescue inhaler 2 puffs 5 to 15 minutes prior to strenuous physical activities. .Marland KitchenRescue medications: May use albuterol rescue inhaler 2 puffs or nebulizer every 4 to 6 hours as needed for shortness of breath, chest tightness, coughing, and wheezing. Monitor frequency of use.  During upper respiratory infections/asthma flares: Start Qvar 460m 2 puffs twice a day and rinse mouth afterwards for 1-2 weeks. . Repeat spirometry at next visit.  Seasonal and perennial allergic rhinoconjunctivitis Past history - Perennial rhino conjunctivitis symptoms for 30+ years with worsening in the summer. Tried Singulair, zyrtec, Astelin, Patanol with minimal improvement in symptoms. No recent allergy skin testing. Interim history - 1 new dog at home. Bloodwork was positive to cat, dog and tree pollen.  Continue Singulair (montelukast) 1028maily.  Continue Astelin nasal spray 2 sprays per nostril 1-2 time a day as needed for runny nose.  Continue Fluticasone 1-2 sprays per nostril daily for nasal congestion.   Nasal saline spray (i.e., Simply Saline) or nasal saline lavage (i.e., NeilMed) is recommended as needed and prior to medicated nasal sprays.  You may use both nasal sprays back  to back.   May use olopatadine Jane drop 1 drop in each Jane twice daily as needed for itchy/watery eyes.   Try Allegra 125m once a day.   Read about allergy injections - once asthma is more stable we can discuss starting allergy injections for the cat, dog and tree pollen.   Pruritic rash Improved with Vanicream hydrocortisone cream.  Take pictures of the rash.  Continue proper skin  care.  May use Vanicream hydrocortisone cream as needed - this is over the counter. Samples given.   Return in about 2 months (around 06/18/2020).  Meds ordered this encounter  Medications  . Mepolizumab SOLR 100 mg   Diagnostics: Spirometry:  Tracings reviewed. Her effort: Good reproducible efforts. FVC: 1.91L FEV1: 1.68L, 72% predicted FEV1/FVC ratio: 88% Interpretation: Spirometry consistent with possible restrictive disease. Please see scanned spirometry results for details.  Medication List:  Current Outpatient Medications  Medication Sig Dispense Refill  . albuterol (PROVENTIL) (2.5 MG/3ML) 0.083% nebulizer solution Take 3 mLs (2.5 mg total) by nebulization every 6 (six) hours as needed for wheezing or shortness of breath. 150 mL 1  . albuterol (VENTOLIN HFA) 108 (90 Base) MCG/ACT inhaler TAKE 2 PUFFS BY MOUTH EVERY 6 HOURS AS NEEDED FOR WHEEZE OR SHORTNESS OF BREATH 18 g 4  . ALPRAZolam (XANAX) 0.5 MG tablet Take 1 tablet (0.5 mg total) by mouth at bedtime. 30 tablet 3  . amitriptyline (ELAVIL) 150 MG tablet Take 0.5 tablets (75 mg total) by mouth at bedtime. 30 tablet 1  . azelastine (ASTELIN) 0.1 % nasal spray PLACE 2 SPRAYS INTO BOTH NOSTRILS 2 (TWO) TIMES DAILY. USE IN EACH NOSTRIL AS DIRECTED 30 mL 4  . beclomethasone (QVAR REDIHALER) 40 MCG/ACT inhaler Inhale 2 puffs into the lungs 2 (two) times daily. 10.6 g 5  . budesonide-formoterol (SYMBICORT) 160-4.5 MCG/ACT inhaler Inhale 2 puffs into the lungs 2 (two) times daily. PRN 1 Inhaler 3  . cetirizine (ZYRTEC) 10 MG tablet Take 1 tablet (10 mg total) by mouth daily. 30 tablet 11  . cloNIDine (CATAPRES) 0.1 MG tablet TAKE 1 TABLET BY MOUTH EVERYDAY AT BEDTIME 90 tablet 1  . clotrimazole-betamethasone (LOTRISONE) cream APPLY TO AFFECTED AREA TWICE A DAY 45 g 5  . cyclobenzaprine (FLEXERIL) 10 MG tablet Take one tablet by mouth at bedtime , as needed , for back spasm 15 tablet 0  . erythromycin ophthalmic ointment APPLY (1CM)  RIBBON INTO THE LOWER CONJUNCTIVAL SAC(S) IN THE AFFECTED Jane(S) 3 TIMES A DAY    . fexofenadine-pseudoephedrine (ALLEGRA-D 24) 180-240 MG 24 hr tablet Take 1 tablet by mouth daily.    . fluticasone (FLONASE) 50 MCG/ACT nasal spray Place 1-2 sprays into both nostrils daily. 16 g 2  . LINZESS 290 MCG CAPS capsule TAKE 1 CAPSULE (290 MCG TOTAL) BY MOUTH DAILY BEFORE BREAKFAST. 90 capsule 2  . Magnesium 400 MG CAPS Take 1 tablet by mouth at bedtime. 30 capsule 10  . medroxyPROGESTERone (PROVERA) 10 MG tablet TAKE 1 TABLET BY MOUTH DAILY FOR 2 WEEKS. REPEAT AS DIRECTED BY PHYSICIAN EVERY 3 MONTHS    . montelukast (SINGULAIR) 10 MG tablet TAKE 1 TABLET BY MOUTH EVERYDAY AT BEDTIME 90 tablet 0  . NARCAN 4 MG/0.1ML LIQD nasal spray kit Place 4 mg into the nose as directed.  1  . NONFORMULARY OR COMPOUNDED ITEM Apply 1-2 pumps, 3-4 times daily as needed. 1 each 0  . oxyCODONE (ROXICODONE) 5 MG immediate release tablet Take 2 tablets (10 mg total) by mouth  2 (two) times daily. 120 tablet 0  . pantoprazole (PROTONIX) 40 MG tablet Take 1 tablet (40 mg total) by mouth daily. 90 tablet 1  . phentermine (ADIPEX-P) 37.5 MG tablet Take 1 tablet (37.5 mg total) by mouth daily before breakfast. Take one half tablet once daily at breakfast for appetite suppression 30 tablet 1  . potassium chloride 20 MEQ/15ML (10%) SOLN Take 15 mLs (20 mEq total) by mouth 2 (two) times daily. 473 mL 1  . Tiotropium Bromide Monohydrate (SPIRIVA RESPIMAT) 1.25 MCG/ACT AERS Inhale 2 puffs into the lungs daily. 4 g 5  . triamterene-hydrochlorothiazide (MAXZIDE-25) 37.5-25 MG tablet TAKE 1 TABLET BY MOUTH EVERY DAY 90 tablet 1  . trolamine salicylate (ASPERCREME) 10 % cream Apply 1 application topically as needed for muscle pain.    Marland Kitchen UNABLE TO FIND Nebulizer mouth piece and tubing  Dx J44.991 1 each 0   No current facility-administered medications for this visit.   Allergies: Allergies  Allergen Reactions  . Neurontin [Gabapentin]  Nausea And Vomiting and Other (See Comments)    Sleep walking/hallucinations   . Penicillins Hives, Shortness Of Breath and Other (See Comments)    Has patient had a PCN reaction causing immediate rash, facial/tongue/throat swelling, SOB or lightheadedness with hypotension: Yes Has patient had a PCN reaction causing severe rash involving mucus membranes or skin necrosis: No Has patient had a PCN reaction that required hospitalization: No Has patient had a PCN reaction occurring within the last 10 years: Yes If all of the above answers are "NO", then may proceed with Cephalosporin use.  . Pregabalin Anaphylaxis, Shortness Of Breath, Diarrhea and Swelling    Lyrica  . Tramadol Other (See Comments)    Makes patient "delusional"  . Latex Rash   I reviewed her past medical history, social history, family history, and environmental history and no significant changes have been reported from her previous visit.  Review of Systems  Constitutional: Negative for appetite change, chills, fever and unexpected weight change.  HENT: Negative for congestion and rhinorrhea.   Eyes: Negative for itching.  Respiratory: Positive for cough, chest tightness and shortness of breath. Negative for wheezing.   Cardiovascular: Negative for chest pain.  Gastrointestinal: Negative for abdominal pain.  Genitourinary: Negative for difficulty urinating.  Skin: Positive for rash.  Allergic/Immunologic: Positive for environmental allergies. Negative for food allergies.  Neurological: Positive for headaches.   Objective: BP 128/76 (BP Location: Left Arm, Patient Position: Sitting, Cuff Size: Normal)   Pulse 92   Resp 17   SpO2 98%  There is no height or weight on file to calculate BMI. Physical Exam Vitals and nursing note reviewed.  Constitutional:      Appearance: Normal appearance. She is well-developed.  HENT:     Head: Normocephalic and atraumatic.     Right Ear: Tympanic membrane and external ear normal.       Left Ear: Tympanic membrane and external ear normal.     Nose: Nose normal.  Eyes:     Conjunctiva/sclera: Conjunctivae normal.  Cardiovascular:     Rate and Rhythm: Normal rate and regular rhythm.     Heart sounds: Normal heart sounds. No murmur heard.  No friction rub. No gallop.   Pulmonary:     Effort: Pulmonary effort is normal.     Breath sounds: No wheezing, rhonchi or rales.  Abdominal:     Palpations: Abdomen is soft.  Musculoskeletal:     Cervical back: Neck supple.  Skin:  General: Skin is warm.     Findings: No rash.  Neurological:     Mental Status: She is alert and oriented to person, place, and time.  Psychiatric:        Mood and Affect: Mood normal.        Behavior: Behavior normal.    Previous notes and tests were reviewed. The plan was reviewed with the patient/family, and all questions/concerned were addressed.  It was my pleasure to see [place] today and participate in her care. Please feel free to contact me with any questions or concerns.  Sincerely,  Rexene Alberts, DO Allergy & Immunology  [facility] office: [phone] [facility] office: [place] office: [phone]

## 2020-04-17 NOTE — Progress Notes (Signed)
Subjective:    Patient ID: Jane English, female    DOB: October 02, 1971, 49 y.o.   MRN: [id number]  HPI  Due to national recommendations of social distancing because of COVID 32, an audio/video tele-health visit is felt to be the most appropriate encounter for this patient at this time. See MyChart message from today for the patient's consent to a tele-health encounter with [place]. This is a follow up tele-visit via phone. The patient is at home. MD is at office.   She has been having back spasms that have been very painful. Dr. Moshe Cipro started her on Flexeril but she did not tolerate this medication well so she stopped taking it.   She also had a fall 1 week ago because her knee gave out.  Current pain is 9/10.   Current BMI is 29.38, Weight is 182 lbs.   She is frustrated that her Xanax has been changed to 0.5 dosage.   She continues on the Amitriptyline but this is not helping much.   She has been getting very painful muscle cramps in her legs. Her PCP drew her bloodwork. She would like to know the results.   She is doing well with current pain regimen.   Pain Inventory Average Pain 9 Pain Right Now 0 My pain is intermittent, sharp, burning, stabbing and numbness  In the last 24 hours, has pain interfered with the following? General activity 6 Relation with others 0 Enjoyment of life 0 What TIME of day is your pain at its worst? varies Sleep (in general) Good  Pain is worse with: walking and standing Pain improves with: heat/ice and medication Relief from Meds: 0  Mobility walk without assistance  Function employed # of hrs/week 20  Neuro/Psych spasms  Prior Studies Any changes since last visit?  no  Physicians involved in your care Any changes since last visit?  no   Family History  Problem Relation Age of Onset  . Lung cancer Father   . Stomach cancer Father   . Esophageal cancer Father   . Alcohol abuse Father     . Mental illness Father   . Diabetes Sister   . Hypertension Sister   . Bipolar disorder Sister   . Schizophrenia Sister   . Diabetes Sister   . Alcohol abuse Brother   . Hypertension Brother   . Kidney disease Brother   . Diabetes Brother   . Drug abuse Brother   . Mental illness Brother   . Alcohol abuse Brother   . Alcohol abuse Brother   . Hypertension Brother   . Diabetes Brother   . Alcohol abuse Brother   . Physical abuse Mother   . Alcohol abuse Mother   . Cirrhosis Mother   . Mental illness Brother        in [place]  . Drug abuse Sister   . HIV Sister   . Alcohol abuse Brother   . Pneumonia Sister        died as a baby  . Alcohol abuse Brother   . Bipolar disorder Brother   . Bipolar disorder Daughter   . Bipolar disorder Son   . Bipolar disorder Son   . Hypertension Brother   . Bipolar disorder Brother   . Drug abuse Brother   . Alcohol abuse Brother   . Bipolar disorder Brother   . ADD / ADHD Neg Hx   . Anxiety disorder Neg Hx   . Dementia Neg  Hx   . Depression Neg Hx   . OCD Neg Hx   . Seizures Neg Hx   . Paranoid behavior Neg Hx   . Colon cancer Neg Hx    Social History   Socioeconomic History  . Marital status: Married    Spouse name: Not on file  . Number of children: 4  . Years of education: Not on file  . Highest education level: Not on file  Occupational History  . Not on file  Tobacco Use  . Smoking status: Never Smoker  . Smokeless tobacco: Never Used  Vaping Use  . Vaping Use: Never used  Substance and Sexual Activity  . Alcohol use: No  . Drug use: No  . Sexual activity: Yes    Partners: Male    Birth control/protection: Surgical    Comment: tubal  Other Topics Concern  . Not on file  Social History Narrative  . Not on file   Social Determinants of Health   Financial Resource Strain:   . Difficulty of Paying Living Expenses:   Food Insecurity:   . Worried About Charity fundraiser in the Last Year:   . Arboriculturist  in the Last Year:   Transportation Needs:   . Film/video editor (Medical):   Marland Kitchen Lack of Transportation (Non-Medical):   Physical Activity:   . Days of Exercise per Week:   . Minutes of Exercise per Session:   Stress:   . Feeling of Stress :   Social Connections:   . Frequency of Communication with Friends and Family:   . Frequency of Social Gatherings with Friends and Family:   . Attends Religious Services:   . Active Member of Clubs or Organizations:   . Attends Archivist Meetings:   Marland Kitchen Marital Status:    Past Surgical History:  Procedure Laterality Date  . ANTERIOR CERVICAL DECOMP/DISCECTOMY FUSION  07/07/2012   Procedure: ANTERIOR CERVICAL DECOMPRESSION/DISCECTOMY FUSION 2 LEVELS;  Surgeon: Floyce Stakes, MD;  Location: [facility];  Service: Neurosurgery;  Laterality: N/A;  Cervical four-five, five - six  Anterior cervical decompression/diskectomy/fusion/plate  . APPENDECTOMY  1986  . BOWEL RESECTION N/A 07/29/2013   Procedure: serosal repair;  Surgeon: Adin Hector, MD;  Location: [facility];  Service: General;  Laterality: N/A;  . CARPAL TUNNEL RELEASE Bilateral   . LAPAROSCOPY N/A 07/29/2013   Procedure: diagnostic laporoscopy;  Surgeon: Adin Hector, MD;  Location: [facility];  Service: General;  Laterality: N/A;  . LAPAROSCOPY N/A 08/16/2013   Procedure: LAPAROSCOPY DIAGNOSTIC/LYSIS OF ADHESIONS;  Surgeon: Adin Hector, MD;  Location: [facility];  Service: General;  Laterality: N/A;  . LAPAROTOMY N/A 08/16/2013   Procedure: EXPLORATORY LAPAROTOMY/SMALL BOWEL RESECTION (JEJUNUM);  Surgeon: Adin Hector, MD;  Location: [facility];  Service: General;  Laterality: N/A;  . LUMBAR SPINE SURGERY  2010   x 3  . LYSIS OF ADHESION  2003   Dr. Irving Shows  . LYSIS OF ADHESION N/A 07/29/2013   Procedure: LYSIS OF ADHESION;  Surgeon: Adin Hector, MD;  Location: [facility];  Service: General;  Laterality: N/A;  . OOPHORECTOMY    . PARTIAL HYSTERECTOMY  1990s?   [place],  [place]  . SPINAL CORD STIMULATOR IMPLANT    . TRIGGER FINGER RELEASE  2009   right pinkie finger  . TUBAL LIGATION  1994   Past Medical History:  Diagnosis Date  . Anemia   . Asthma   .  Asthma flare 04/09/2013  . Back pain   . Bronchitis   . Chronic abdominal pain   . Chronic constipation   . Constipation due to opioid therapy   . Depression   . Depression, major, single episode, severe ([place]) 10/03/2018   PHQ 9 score of 15  . Diabetes mellitus without complication ([place])   . Diabetes mellitus, type II ([place])   . DVT (deep venous thrombosis) ([place]) 2010  . GERD (gastroesophageal reflux disease)   . Heart murmur    no cardiologist  . Helicobacter pylori gastritis 06/11/2013   Colonoscopy Dr. Hilarie Fredrickson  . Hypertension   . IBS (irritable bowel syndrome)   . Migraine headache   . Neuropathy   . Obesity   . Obsessive-compulsive disorder   . PSYCHOTIC D/O W/HALLUCINATIONS CONDS CLASS ELSW 03/04/2010   Qualifier: Diagnosis of  By: Moshe Cipro MD, Joycelyn Schmid    . PTSD (post-traumatic stress disorder)   . SBO (small bowel obstruction) ([place]) 08/09/2013  . Seasonal allergies 12/10/2012  . Seizures ([place])   . Shortness of breath    BP 120/82 Comment: last recorded--unable to check at home  Pulse 66 Comment: last recorded--unable to check at home  Temp (!) 97.3 F (36.3 C) Comment: last recorded--unable to check at home  Ht 5\' 6"  (1.676 m) Comment: last recorded--unable to check at home  Wt 182 lb (82.6 kg) Comment: last recorded--unable to check at home  BMI 29.38 kg/m   Opioid Risk Score:   Fall Risk Score:  `1  Depression screen PHQ 2/9  Depression screen [facility] 2/9 02/23/2020 02/18/2020 02/01/2020 12/28/2019 10/27/2019 09/27/2019 09/23/2019  Decreased Interest 0 0 0 0 0 0 2  Down, Depressed, Hopeless 0 0 0 0 0 0 2  PHQ - 2 Score 0 0 0 0 0 0 4  Altered sleeping - - - 0 - 1 2  Tired, decreased energy - - - 0 - 1 2  Change in appetite - - - 0 - 0 2  Feeling bad or failure about yourself  - - - 0 - 0  1  Trouble concentrating - - - 0 - 0 2  Moving slowly or fidgety/restless - - - 0 - 0 0  Suicidal thoughts - - - 0 - 0 0  PHQ-9 Score - - - 0 - 2 13  Difficult doing work/chores - - - Not difficult at all - Not difficult at all Somewhat difficult  Some encounter information is confidential and restricted. Go to Review Flowsheets activity to see all data.  Some recent data might be hidden    Review of Systems  Constitutional: Positive for diaphoresis.  HENT: Negative.   Eyes: Negative.   Respiratory: Negative.   Cardiovascular: Negative.   Gastrointestinal: Negative.   Endocrine: Negative.   Genitourinary: Negative.   Musculoskeletal:       Spasms and cramping  Skin: Negative.   Allergic/Immunologic: Negative.   Neurological: Positive for numbness.  Hematological: Negative.   All other systems reviewed and are negative.      Objective:   Physical Exam Not performed as patient was seen via WebEx.     Assessment & Plan:  49 year old woman with chronic pain after failed back surgery.  --She has severe pain, worse in her lower back, cervical spine, and knee. Also has obesity which contributes to her pain. Pain has continued to improve with medication and after Synvisc-One injection to knee. Injection was 3/18 and provided benefit. She would like to repeat  on 9/18.    -Reviewed MRI lumbar spine from 2013 with shows the following: 1.  Stable appearance of the lumbar spine status post L5-S1 PLIF. 2.  No significant adjacent segment disease, disc herniation, acquired spinal stenosis or nerve root encroachment. Mild facet degenerative changes inferiorly are stable. 3.  No abnormal intradural enhancement.   --She has been on Oxycodone in the past and had functional benefits. She was able to walk more, about 2 miles per day. She as tried surgery, injections, and neuropathic pain medications without relief or with negative side effects. After UDS and contract, I prescribed Roxicodone  10mg  BID. Discussed that our goals are functional improvement and not long term dependence on oxycodone. Discussed creating a daily exercise regimen for pain control and overall health. She has been doing her 17 stairs daily and has lost 11 lbs in 3 months!   --She has seen Dr. Clydell Hakim at [facility] and told that there is likely keloid formation around her inactive pain stimulation system, so it would be best to leave as is.     --Life stressors. Discussed at length her stress regarding her divorce, as well as her sources of support and joy. Encouraged social interaction and physical activity. She shares with me pictures of her children and newborn grandson.  -Muscle cramps: Start Magnesium 400mg  HS. Advised that help with insomnia and muscle cramps. 6/3 level is 1.7, at the low end of normal.  -Elevated creatinine: elevated to 1.05. Encouraged hydration.  -Other labs reviewed with patient. HgbA1C slightly improved from last visit. Currently 6.3. Continue low carb, high protein, high fat diet.    22 minutes of face to face patient care time were spent during this visit. All questions were encouraged and answered. Follow up with me in 1 month.

## 2020-04-18 ENCOUNTER — Encounter: Payer: Self-pay | Admitting: Allergy

## 2020-04-18 ENCOUNTER — Ambulatory Visit: Payer: Medicare HMO | Admitting: Allergy

## 2020-04-18 ENCOUNTER — Other Ambulatory Visit: Payer: Self-pay

## 2020-04-18 VITALS — BP 128/76 | HR 92 | Resp 17

## 2020-04-18 DIAGNOSIS — H1010 Acute atopic conjunctivitis, unspecified eye: Secondary | ICD-10-CM | POA: Diagnosis not present

## 2020-04-18 DIAGNOSIS — J302 Other seasonal allergic rhinitis: Secondary | ICD-10-CM | POA: Diagnosis not present

## 2020-04-18 DIAGNOSIS — J4550 Severe persistent asthma, uncomplicated: Secondary | ICD-10-CM

## 2020-04-18 DIAGNOSIS — L282 Other prurigo: Secondary | ICD-10-CM

## 2020-04-18 DIAGNOSIS — J3089 Other allergic rhinitis: Secondary | ICD-10-CM

## 2020-04-18 MED ORDER — MEPOLIZUMAB 100 MG ~~LOC~~ SOLR
100.0000 mg | Freq: Once | SUBCUTANEOUS | Status: AC
  Administered 2020-04-18: 100 mg via SUBCUTANEOUS

## 2020-04-18 NOTE — Assessment & Plan Note (Addendum)
Past history - Diagnosed with asthma over 30 years ago and using Symbicort 160 2 puffs twice a day and albuterol a few times a day. Patient has history of hospitalization with intubation in the past. CXR in March 2021 no active disease.  Interim history - some benefit with the addition of Spiriva but now has a dog and using albuterol nebulizer 1-3 times a day with good benefit.  ACT score 10.  Today's spirometry showed some restriction. . Start Nucala injections every 4 weeks - prefers to self administer at home. . Discussed risks and benefits and first sample dose given in the office today. . Will start paperwork to get authorization.  . Use your albuterol nebulizer machine twice a day for the next 1 week prior to using your inhalers. . Continue with Qvar 40 2 puffs twice a day for the next 2 weeks.  . Daily controller medication(s): Continue Symbicort 160 2 puffs twice a day with spacer and rinse mouth afterwards. o Continue Spiriva 1.11mcg 2 puff once a day.  . Prior to physical activity: May use albuterol rescue inhaler 2 puffs 5 to 15 minutes prior to strenuous physical activities. Marland Kitchen Rescue medications: May use albuterol rescue inhaler 2 puffs or nebulizer every 4 to 6 hours as needed for shortness of breath, chest tightness, coughing, and wheezing. Monitor frequency of use.  During upper respiratory infections/asthma flares: Start Qvar 55mcg 2 puffs twice a day and rinse mouth afterwards for 1-2 weeks. . Repeat spirometry at next visit.

## 2020-04-18 NOTE — Assessment & Plan Note (Addendum)
Improved with Vanicream hydrocortisone cream.  Take pictures of the rash.  Continue proper skin care.  May use Vanicream hydrocortisone cream as needed - this is over the counter. Samples given.

## 2020-04-18 NOTE — Patient Instructions (Addendum)
Asthma: . Start Nucala injections every 4 weeks.  . Will start paperwork to get approval. Tammy is our biologics coordinator who will be in touch with you.  . Use your albuterol nebulizer machine twice a day for the next 1 week prior to using your inhalers. . Continue with Qvar 40 2 puffs twice a day for the next 2 weeks.  . Daily controller medication(s): Continue Symbicort 160 2 puffs twice a day with spacer and rinse mouth afterwards. o Continue Spiriva 1.67mcg 2 puff once a day.  . Prior to physical activity: May use albuterol rescue inhaler 2 puffs 5 to 15 minutes prior to strenuous physical activities. Marland Kitchen Rescue medications: May use albuterol rescue inhaler 2 puffs or nebulizer every 4 to 6 hours as needed for shortness of breath, chest tightness, coughing, and wheezing. Monitor frequency of use.  During upper respiratory infections/asthma flares: Start Qvar 70mcg 2 puffs twice a day and rinse mouth afterwards for 1-2 weeks. . Asthma control goals:  o Full participation in all desired activities (may need albuterol before activity) o Albuterol use two times or less a week on average (not counting use with activity) o Cough interfering with sleep two times or less a month o Oral steroids no more than once a year o No hospitalizations  Rhinitis: Bloodwork was positive to cat, dog and tree pollen.  Continue Singulair (montelukast) 10mg  daily.  Continue Astelin nasal spray 2 sprays per nostril 1-2 time a day as needed for runny nose.  Continue Fluticasone 1-2 sprays per nostril daily for nasal congestion.   Nasal saline spray (i.e., Simply Saline) or nasal saline lavage (i.e., NeilMed) is recommended as needed and prior to medicated nasal sprays.  You may use both nasal sprays back to back.   May use olopatadine eye drop 1 drop in each eye twice daily as needed for itchy/watery eyes.   Try Allegra 180mg  once a day.   Read about allergy injections - once your asthma is more stable  we can discuss starting allergy injections for the cat, dog and tree pollen.   Rash:  Continue proper skin care.   May use vanicream hydrocortisone cream as needed - this is over the counter. Samples given.   Follow up in 2 months or sooner if needed for asthma check.   Skin care recommendations  Bath time: . Always use lukewarm water. AVOID very hot or cold water. Marland Kitchen Keep bathing time to 5-10 minutes. . Do NOT use bubble bath. . Use a mild soap and use just enough to wash the dirty areas. . Do NOT scrub skin vigorously.  . After bathing, pat dry your skin with a towel. Do NOT rub or scrub the skin.  Moisturizers and prescriptions:  . ALWAYS apply moisturizers immediately after bathing (within 3 minutes). This helps to lock-in moisture. . Use the moisturizer several times a day over the whole body. Kermit Balo summer moisturizers include: Aveeno, CeraVe, Cetaphil. Kermit Balo winter moisturizers include: Aquaphor, Vaseline, Cerave, Cetaphil, Eucerin, Vanicream. . When using moisturizers along with medications, the moisturizer should be applied about one hour after applying the medication to prevent diluting effect of the medication or moisturize around where you applied the medications. When not using medications, the moisturizer can be continued twice daily as maintenance.  Laundry and clothing: . Avoid laundry products with added color or perfumes. . Use unscented hypo-allergenic laundry products such as Tide free, Cheer free & gentle, and All free and clear.  . If the skin  still seems dry or sensitive, you can try double-rinsing the clothes. . Avoid tight or scratchy clothing such as wool. . Do not use fabric softeners or dyer sheets.  Pet Allergen Avoidance: . Contrary to popular opinion, there are no "hypoallergenic" breeds of dogs or cats. That is because people are not allergic to an animal's hair, but to an allergen found in the animal's saliva, dander (dead skin flakes) or urine.  Pet allergy symptoms typically occur within minutes. For some people, symptoms can build up and become most severe 8 to 12 hours after contact with the animal. People with severe allergies can experience reactions in public places if dander has been transported on the pet owners' clothing. Marland Kitchen Keeping an animal outdoors is only a partial solution, since homes with pets in the yard still have higher concentrations of animal allergens. . Before getting a pet, ask your allergist to determine if you are allergic to animals. If your pet is already considered part of your family, try to minimize contact and keep the pet out of the bedroom and other rooms where you spend a great deal of time. . As with dust mites, vacuum carpets often or replace carpet with a hardwood floor, tile or linoleum. . High-efficiency particulate air (HEPA) cleaners can reduce allergen levels over time. . While dander and saliva are the source of cat and dog allergens, urine is the source of allergens from rabbits, hamsters, mice and [place] pigs; so ask a non-allergic family member to clean the animal's cage. . If you have a pet allergy, talk to your allergist about the potential for allergy immunotherapy (allergy shots). This strategy can often provide long-term relief.  Reducing Pollen Exposure . Pollen seasons: trees (spring), grass (summer) and ragweed/weeds (fall). Marland Kitchen Keep windows closed in your home and car to lower pollen exposure.  Susa Simmonds air conditioning in the bedroom and throughout the house if possible.  . Avoid going out in dry windy days - especially early morning. . Pollen counts are highest between 5 - 10 AM and on dry, hot and windy days.  . Save outside activities for late afternoon or after a heavy rain, when pollen levels are lower.  . Avoid mowing of grass if you have grass pollen allergy. Marland Kitchen Be aware that pollen can also be transported indoors on people and pets.  . Dry your clothes in an automatic dryer  rather than hanging them outside where they might collect pollen.  . Rinse hair and eyes before bedtime.

## 2020-04-18 NOTE — Assessment & Plan Note (Signed)
Past history - Perennial rhino conjunctivitis symptoms for 30+ years with worsening in the summer. Tried Singulair, zyrtec, Astelin, Patanol with minimal improvement in symptoms. No recent allergy skin testing. Interim history - 1 new dog at home. Bloodwork was positive to cat, dog and tree pollen.  Continue Singulair (montelukast) 10mg  daily.  Continue Astelin nasal spray 2 sprays per nostril 1-2 time a day as needed for runny nose.  Continue Fluticasone 1-2 sprays per nostril daily for nasal congestion.   Nasal saline spray (i.e., Simply Saline) or nasal saline lavage (i.e., NeilMed) is recommended as needed and prior to medicated nasal sprays.  You may use both nasal sprays back to back.   May use olopatadine eye drop 1 drop in each eye twice daily as needed for itchy/watery eyes.   Try Allegra 180mg  once a day.   Read about allergy injections - once asthma is more stable we can discuss starting allergy injections for the cat, dog and tree pollen.

## 2020-04-20 ENCOUNTER — Telehealth: Payer: Self-pay | Admitting: *Deleted

## 2020-04-20 NOTE — Telephone Encounter (Signed)
Tried to contact patient to discuss Nucala and obtaining same through patient assistance [place] [facility] [place] due to [place] and out of pocket cost.  Was unable to leave message due to voice mail full

## 2020-04-20 NOTE — Telephone Encounter (Signed)
-----   Message from Garnet Sierras, DO sent at 04/18/2020 10:52 PM EDT ----- Please start PA for Nucala every 4 weeks for at home administration. This is for asthma and eosinophil count on 04/06/20 was 500. Thank you. Sample dose given today in office.

## 2020-04-21 NOTE — Telephone Encounter (Signed)
Tried to call patient but no answer and unable to leave message.

## 2020-04-24 NOTE — Telephone Encounter (Signed)
Spoke to patient and emailed application for patient assistance for [employer]

## 2020-05-01 ENCOUNTER — Encounter: Payer: Self-pay | Admitting: Family Medicine

## 2020-05-01 ENCOUNTER — Other Ambulatory Visit: Payer: Self-pay | Admitting: *Deleted

## 2020-05-01 ENCOUNTER — Other Ambulatory Visit: Payer: Self-pay

## 2020-05-01 ENCOUNTER — Ambulatory Visit (INDEPENDENT_AMBULATORY_CARE_PROVIDER_SITE_OTHER): Payer: Medicare HMO | Admitting: Family Medicine

## 2020-05-01 VITALS — BP 136/84 | HR 93 | Temp 97.2°F | Resp 18 | Ht 66.0 in | Wt 179.0 lb

## 2020-05-01 DIAGNOSIS — I10 Essential (primary) hypertension: Secondary | ICD-10-CM

## 2020-05-01 DIAGNOSIS — M79675 Pain in left toe(s): Secondary | ICD-10-CM | POA: Diagnosis not present

## 2020-05-01 DIAGNOSIS — M79672 Pain in left foot: Secondary | ICD-10-CM | POA: Diagnosis not present

## 2020-05-01 DIAGNOSIS — M79671 Pain in right foot: Secondary | ICD-10-CM | POA: Insufficient documentation

## 2020-05-01 MED ORDER — POTASSIUM CHLORIDE 20 MEQ/15ML (10%) PO SOLN: 20.0000 meq | Freq: Two times a day (BID) | ORAL | 1 refills | Status: DC

## 2020-05-01 NOTE — Patient Instructions (Signed)
F/U as before , call if you need me sooner  You are referred to Podiatry urgently  Thanks for choosing [facility], we consider it a privelige to serve you.

## 2020-05-02 ENCOUNTER — Encounter: Payer: Self-pay | Admitting: Family Medicine

## 2020-05-02 NOTE — Assessment & Plan Note (Signed)
Controlled, no change in medication  

## 2020-05-02 NOTE — Assessment & Plan Note (Signed)
2 week history,persistimg post trauma, refer to Podiatry

## 2020-05-02 NOTE — Assessment & Plan Note (Signed)
Swelling and tenderness of left great toe with subungual hemorrhage, refer to Podiatry

## 2020-05-02 NOTE — Progress Notes (Signed)
   Jane English     MRN: [id number]      DOB: 03-07-1971   HPI Ms. Frisch is here c/o direct trauma toleft great toe, since then nail is discoloured and seems as though will fall off, and it is painful. No h/o drainage from toe. Otherwise doing well  ROS See HPI  C/o chronic  joint pain,  and limitation in mobility. Denies headaches, seizures, numbness, or tingling. Denies uncontrolled  depression, anxiety or insomnia. Denies skin break down or rash.   PE  BP 136/84 (BP Location: Left Arm, Patient Position: Sitting, Cuff Size: Normal)   Pulse 93   Temp (!) 97.2 F (36.2 C) (Temporal)   Resp 18   Ht 5\' 6"  (1.676 m)   Wt 179 lb (81.2 kg)   SpO2 97%   BMI 28.89 kg/m   Patient alert and oriented and in no cardiopulmonary distress.  HEENT: No facial asymmetry, EOMI,     Neck supple .  Chest: Clear to auscultation bilaterally.  CVS: S1, S2 no murmurs, no S3.Regular rate.  Ext: No edema  MS: left great toe slightly swollen and tender, and nail is discolored / hyperpigmented at base of nail.  Skin: Intact, no ulcerations or rash noted.  Psych: Good eye contact, normal affect. Memory intact not anxious or depressed appearing.  CNS: CN 2-12 intact, power,  normal throughout.no focal deficits noted.   Assessment & Plan  Left foot pain 2 week history,persistimg post trauma, refer to Podiatry  Great toe pain, left Swelling and tenderness of left great toe with subungual hemorrhage, refer to Podiatry  Essential hypertension Controlled, no change in medication

## 2020-05-03 ENCOUNTER — Ambulatory Visit: Payer: Medicare HMO | Admitting: Physical Medicine and Rehabilitation

## 2020-05-05 ENCOUNTER — Encounter: Payer: Self-pay | Admitting: Podiatry

## 2020-05-05 ENCOUNTER — Other Ambulatory Visit: Payer: Self-pay

## 2020-05-05 ENCOUNTER — Ambulatory Visit: Payer: Medicare HMO | Admitting: Podiatry

## 2020-05-05 DIAGNOSIS — S90112A Contusion of left great toe without damage to nail, initial encounter: Secondary | ICD-10-CM | POA: Diagnosis not present

## 2020-05-05 DIAGNOSIS — M25571 Pain in right ankle and joints of right foot: Secondary | ICD-10-CM

## 2020-05-05 DIAGNOSIS — M25572 Pain in left ankle and joints of left foot: Secondary | ICD-10-CM | POA: Diagnosis not present

## 2020-05-05 NOTE — Progress Notes (Signed)
Subjective:  Patient ID: Jane English, female    DOB: Jun 25, 1971,  MRN: [id number] HPI Chief Complaint  Patient presents with  . Toe Injury    Hallux left - dropped skillet on toe x 2 weeks ago, bruised nail, tried cutting nail at base, drained, better, but tender  . Ankle Pain    Requesting ankle injections again - bilateral   . New Patient (Initial Visit)    Est tp 07/2017    49 y.o. female presents with the above complaint.   ROS: Denies fever chills nausea vomiting muscle aches pains calf pain back pain chest pain shortness of breath.  Past Medical History:  Diagnosis Date  . Anemia   . Asthma   . Asthma flare 04/09/2013  . Back pain   . Bronchitis   . Chronic abdominal pain   . Chronic constipation   . Constipation due to opioid therapy   . Depression   . Depression, major, single episode, severe ([place]) 10/03/2018   PHQ 9 score of 15  . Diabetes mellitus without complication ([place])   . Diabetes mellitus, type II ([place])   . DVT (deep venous thrombosis) ([place]) 2010  . GERD (gastroesophageal reflux disease)   . Heart murmur    no cardiologist  . Helicobacter pylori gastritis 06/11/2013   Colonoscopy Dr. Hilarie Fredrickson  . Hypertension   . IBS (irritable bowel syndrome)   . Migraine headache   . Neuropathy   . Obesity   . Obsessive-compulsive disorder   . PSYCHOTIC D/O W/HALLUCINATIONS CONDS CLASS ELSW 03/04/2010   Qualifier: Diagnosis of  By: Moshe Cipro MD, Joycelyn Schmid    . PTSD (post-traumatic stress disorder)   . SBO (small bowel obstruction) ([place]) 08/09/2013  . Seasonal allergies 12/10/2012  . Seizures ([place])   . Shortness of breath    Past Surgical History:  Procedure Laterality Date  . ANTERIOR CERVICAL DECOMP/DISCECTOMY FUSION  07/07/2012   Procedure: ANTERIOR CERVICAL DECOMPRESSION/DISCECTOMY FUSION 2 LEVELS;  Surgeon: Floyce Stakes, MD;  Location: [facility];  Service: Neurosurgery;  Laterality: N/A;  Cervical four-five, five - six  Anterior cervical  decompression/diskectomy/fusion/plate  . APPENDECTOMY  1986  . BOWEL RESECTION N/A 07/29/2013   Procedure: serosal repair;  Surgeon: Adin Hector, MD;  Location: [facility];  Service: General;  Laterality: N/A;  . CARPAL TUNNEL RELEASE Bilateral   . LAPAROSCOPY N/A 07/29/2013   Procedure: diagnostic laporoscopy;  Surgeon: Adin Hector, MD;  Location: [facility];  Service: General;  Laterality: N/A;  . LAPAROSCOPY N/A 08/16/2013   Procedure: LAPAROSCOPY DIAGNOSTIC/LYSIS OF ADHESIONS;  Surgeon: Adin Hector, MD;  Location: [facility];  Service: General;  Laterality: N/A;  . LAPAROTOMY N/A 08/16/2013   Procedure: EXPLORATORY LAPAROTOMY/SMALL BOWEL RESECTION (JEJUNUM);  Surgeon: Adin Hector, MD;  Location: [facility];  Service: General;  Laterality: N/A;  . LUMBAR SPINE SURGERY  2010   x 3  . LYSIS OF ADHESION  2003   Dr. Irving Shows  . LYSIS OF ADHESION N/A 07/29/2013   Procedure: LYSIS OF ADHESION;  Surgeon: Adin Hector, MD;  Location: [facility];  Service: General;  Laterality: N/A;  . OOPHORECTOMY    . PARTIAL HYSTERECTOMY  1990s?   [place], [facility]  . SPINAL CORD STIMULATOR IMPLANT    . TRIGGER FINGER RELEASE  2009   right pinkie finger  . TUBAL LIGATION  1994    Current Outpatient Medications:  .  albuterol (PROVENTIL) (2.5 MG/3ML) 0.083% nebulizer solution, Take 3 mLs (2.5 mg total)  by nebulization every 6 (six) hours as needed for wheezing or shortness of breath., Disp: 150 mL, Rfl: 1 .  albuterol (VENTOLIN HFA) 108 (90 Base) MCG/ACT inhaler, TAKE 2 PUFFS BY MOUTH EVERY 6 HOURS AS NEEDED FOR WHEEZE OR SHORTNESS OF BREATH, Disp: 18 g, Rfl: 4 .  ALPRAZolam (XANAX) 0.5 MG tablet, Take 1 tablet (0.5 mg total) by mouth at bedtime., Disp: 30 tablet, Rfl: 3 .  amitriptyline (ELAVIL) 150 MG tablet, Take 0.5 tablets (75 mg total) by mouth at bedtime., Disp: 30 tablet, Rfl: 1 .  azelastine (ASTELIN) 0.1 % nasal spray, PLACE 2 SPRAYS INTO BOTH NOSTRILS 2 (TWO) TIMES DAILY. USE IN EACH NOSTRIL AS  DIRECTED, Disp: 30 mL, Rfl: 4 .  beclomethasone (QVAR REDIHALER) 40 MCG/ACT inhaler, Inhale 2 puffs into the lungs 2 (two) times daily., Disp: 10.6 g, Rfl: 5 .  budesonide-formoterol (SYMBICORT) 160-4.5 MCG/ACT inhaler, Inhale 2 puffs into the lungs 2 (two) times daily. PRN, Disp: 1 Inhaler, Rfl: 3 .  cetirizine (ZYRTEC) 10 MG tablet, Take 1 tablet (10 mg total) by mouth daily., Disp: 30 tablet, Rfl: 11 .  cloNIDine (CATAPRES) 0.1 MG tablet, TAKE 1 TABLET BY MOUTH EVERYDAY AT BEDTIME, Disp: 90 tablet, Rfl: 1 .  clotrimazole-betamethasone (LOTRISONE) cream, APPLY TO AFFECTED AREA TWICE A DAY, Disp: 45 g, Rfl: 5 .  cyclobenzaprine (FLEXERIL) 10 MG tablet, Take one tablet by mouth at bedtime , as needed , for back spasm, Disp: 15 tablet, Rfl: 0 .  erythromycin ophthalmic ointment, APPLY (1CM) RIBBON INTO THE LOWER CONJUNCTIVAL SAC(S) IN THE AFFECTED EYE(S) 3 TIMES A DAY, Disp: , Rfl:  .  fexofenadine-pseudoephedrine (ALLEGRA-D 24) 180-240 MG 24 hr tablet, Take 1 tablet by mouth daily., Disp: , Rfl:  .  fluticasone (FLONASE) 50 MCG/ACT nasal spray, Place 1-2 sprays into both nostrils daily., Disp: 16 g, Rfl: 2 .  LINZESS 290 MCG CAPS capsule, TAKE 1 CAPSULE (290 MCG TOTAL) BY MOUTH DAILY BEFORE BREAKFAST., Disp: 90 capsule, Rfl: 2 .  Magnesium 400 MG CAPS, Take 1 tablet by mouth at bedtime., Disp: 30 capsule, Rfl: 10 .  medroxyPROGESTERone (PROVERA) 10 MG tablet, TAKE 1 TABLET BY MOUTH DAILY FOR 2 WEEKS. REPEAT AS DIRECTED BY PHYSICIAN EVERY 3 MONTHS, Disp: , Rfl:  .  montelukast (SINGULAIR) 10 MG tablet, TAKE 1 TABLET BY MOUTH EVERYDAY AT BEDTIME, Disp: 90 tablet, Rfl: 0 .  NARCAN 4 MG/0.1ML LIQD nasal spray kit, Place 4 mg into the nose as directed., Disp: , Rfl: 1 .  NONFORMULARY OR COMPOUNDED ITEM, Apply 1-2 pumps, 3-4 times daily as needed., Disp: 1 each, Rfl: 0 .  oxyCODONE (ROXICODONE) 5 MG immediate release tablet, Take 2 tablets (10 mg total) by mouth 2 (two) times daily., Disp: 120 tablet, Rfl:  0 .  pantoprazole (PROTONIX) 40 MG tablet, Take 1 tablet (40 mg total) by mouth daily., Disp: 90 tablet, Rfl: 1 .  phentermine (ADIPEX-P) 37.5 MG tablet, Take 1 tablet (37.5 mg total) by mouth daily before breakfast. Take one half tablet once daily at breakfast for appetite suppression, Disp: 30 tablet, Rfl: 1 .  potassium chloride 20 MEQ/15ML (10%) SOLN, Take 15 mLs (20 mEq total) by mouth 2 (two) times daily., Disp: 473 mL, Rfl: 1 .  Tiotropium Bromide Monohydrate (SPIRIVA RESPIMAT) 1.25 MCG/ACT AERS, Inhale 2 puffs into the lungs daily., Disp: 4 g, Rfl: 5 .  triamterene-hydrochlorothiazide (MAXZIDE-25) 37.5-25 MG tablet, TAKE 1 TABLET BY MOUTH EVERY DAY, Disp: 90 tablet, Rfl: 1 .  trolamine salicylate (  ASPERCREME) 10 % cream, Apply 1 application topically as needed for muscle pain., Disp: , Rfl:  .  UNABLE TO FIND, Nebulizer mouth piece and tubing  Dx J44.991, Disp: 1 each, Rfl: 0  Allergies  Allergen Reactions  . Neurontin [Gabapentin] Nausea And Vomiting and Other (See Comments)    Sleep walking/hallucinations   . Penicillins Hives, Shortness Of Breath and Other (See Comments)    Has patient had a PCN reaction causing immediate rash, facial/tongue/throat swelling, SOB or lightheadedness with hypotension: Yes Has patient had a PCN reaction causing severe rash involving mucus membranes or skin necrosis: No Has patient had a PCN reaction that required hospitalization: No Has patient had a PCN reaction occurring within the last 10 years: Yes If all of the above answers are "NO", then may proceed with Cephalosporin use.  . Pregabalin Anaphylaxis, Shortness Of Breath, Diarrhea and Swelling    Lyrica  . Tramadol Other (See Comments)    Makes patient "delusional"  . Latex Rash   Review of Systems Objective:  There were no vitals filed for this visit.  General: Well developed, nourished, in no acute distress, alert and oriented x3   Dermatological: Skin is warm, dry and supple bilateral.  Nails x 10 are well maintained; remaining integument appears unremarkable at this time. There are no open sores, no preulcerative lesions, no rash or signs of infection present.  Vascular: Dorsalis Pedis artery and Posterior Tibial artery pedal pulses are 2/4 bilateral with immedate capillary fill time. Pedal hair growth present. No varicosities and no lower extremity edema present bilateral.   Neruologic: Grossly intact via light touch bilateral. Vibratory intact via tuning fork bilateral. Protective threshold with Semmes Wienstein monofilament intact to all pedal sites bilateral. Patellar and Achilles deep tendon reflexes 2+ bilateral. No Babinski or clonus noted bilateral.   Musculoskeletal: No gross boney pedal deformities bilateral. No pain, crepitus, or limitation noted with foot and ankle range of motion bilateral. Muscular strength 5/5 in all groups tested bilateral.  Gait: Unassisted, Nonantalgic.    Radiographs:  None taken  Assessment & Plan:   Assessment: Subtalar joint capsulitis  Plan: After sterile Betadine skin prep I injected subtalar joint today through the sinus tarsi with 10 mg Kenalog 5 mg Marcaine point maximal tenderness.     Rossana Molchan T. [place], [place]

## 2020-05-12 ENCOUNTER — Encounter: Payer: Medicare HMO | Attending: Physical Medicine and Rehabilitation | Admitting: Physical Medicine and Rehabilitation

## 2020-05-12 ENCOUNTER — Other Ambulatory Visit: Payer: Self-pay

## 2020-05-12 ENCOUNTER — Encounter: Payer: Self-pay | Admitting: Physical Medicine and Rehabilitation

## 2020-05-12 VITALS — Temp 98.5°F | Ht 66.0 in | Wt 180.6 lb

## 2020-05-12 DIAGNOSIS — M19042 Primary osteoarthritis, left hand: Secondary | ICD-10-CM | POA: Diagnosis not present

## 2020-05-12 DIAGNOSIS — M5010 Cervical disc disorder with radiculopathy, unspecified cervical region: Secondary | ICD-10-CM

## 2020-05-12 DIAGNOSIS — Z5181 Encounter for therapeutic drug level monitoring: Secondary | ICD-10-CM | POA: Diagnosis not present

## 2020-05-12 DIAGNOSIS — Z79891 Long term (current) use of opiate analgesic: Secondary | ICD-10-CM | POA: Diagnosis not present

## 2020-05-12 DIAGNOSIS — M7918 Myalgia, other site: Secondary | ICD-10-CM | POA: Diagnosis not present

## 2020-05-12 DIAGNOSIS — G894 Chronic pain syndrome: Secondary | ICD-10-CM | POA: Insufficient documentation

## 2020-05-12 DIAGNOSIS — M19041 Primary osteoarthritis, right hand: Secondary | ICD-10-CM | POA: Diagnosis not present

## 2020-05-12 DIAGNOSIS — M961 Postlaminectomy syndrome, not elsewhere classified: Secondary | ICD-10-CM | POA: Insufficient documentation

## 2020-05-12 DIAGNOSIS — M5136 Other intervertebral disc degeneration, lumbar region: Secondary | ICD-10-CM | POA: Diagnosis not present

## 2020-05-12 DIAGNOSIS — M5416 Radiculopathy, lumbar region: Secondary | ICD-10-CM | POA: Diagnosis not present

## 2020-05-12 MED ORDER — OXYCODONE HCL 5 MG PO TABS: 10.0000 mg | ORAL_TABLET | Freq: Two times a day (BID) | ORAL | 0 refills | Status: DC

## 2020-05-12 NOTE — Progress Notes (Signed)
Subjective:    Patient ID: Jane English, female    DOB: September 21, 1971, 49 y.o.   MRN: [id number]  HPI Since last visit she called me to let me know that she has developed worsening right hip pain. She was note sure whether it was coming from her hip or lower back. I asked her to move her appointment earlier so I could examine her. She points the location of her most severe pain to be in her right buttock, where she has previously described pain. The pain radiates posteriorly down her thigh- it does not extend further. Pain is 8 right now and on average. It feels constant, sharp, burning, dull, stabbing, tingling, and aching. It is worse with walking, bending, sitting, and standing.   She will also require a refill of her Oxycodone for chronic pain for her failed back syndrome.   Pain Inventory Average Pain 8 Pain Right Now 8 My pain is constant, sharp, burning, dull, stabbing, tingling and aching  In the last 24 hours, has pain interfered with the following? General activity 6 Relation with others 0 Enjoyment of life 6 What TIME of day is your pain at its worst? All the time. Sleep (in general) Fair  Pain is worse with: walking, bending, sitting and standing Pain improves with: rest, heat/ice and medication Relief from Meds: 0  Mobility how many minutes can you walk? 10-15 mins ability to climb steps?  yes do you drive?  yes Do you have any goals in this area?  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 I need assistance with the following:  Needs no help. Do you have any goals in this area?  yes  Neuro/Psych bladder control problems numbness tingling trouble walking spasms  Prior Studies Any changes since last visit?  no  Physicians involved in your care Any changes since last visit?  no   Family History  Problem Relation Age of Onset  . Lung cancer Father   . Stomach cancer Father   . Esophageal  cancer Father   . Alcohol abuse Father   . Mental illness Father   . Diabetes Sister   . Hypertension Sister   . Bipolar disorder Sister   . Schizophrenia Sister   . Diabetes Sister   . Alcohol abuse Brother   . Hypertension Brother   . Kidney disease Brother   . Diabetes Brother   . Drug abuse Brother   . Mental illness Brother   . Alcohol abuse Brother   . Alcohol abuse Brother   . Hypertension Brother   . Diabetes Brother   . Alcohol abuse Brother   . Physical abuse Mother   . Alcohol abuse Mother   . Cirrhosis Mother   . Mental illness Brother        in [place]  . Drug abuse Sister   . HIV Sister   . Alcohol abuse Brother   . Pneumonia Sister        died as a baby  . Alcohol abuse Brother   . Bipolar disorder Brother   . Bipolar disorder Daughter   . Bipolar disorder Son   . Bipolar disorder Son   . Hypertension Brother   . Bipolar disorder Brother   . Drug abuse Brother   . Alcohol abuse Brother   . Bipolar disorder Brother   . ADD / ADHD Neg Hx   . Anxiety disorder Neg Hx   .  Dementia Neg Hx   . Depression Neg Hx   . OCD Neg Hx   . Seizures Neg Hx   . Paranoid behavior Neg Hx   . Colon cancer Neg Hx    Social History   Socioeconomic History  . Marital status: Married    Spouse name: Not on file  . Number of children: 4  . Years of education: Not on file  . Highest education level: Not on file  Occupational History  . Not on file  Tobacco Use  . Smoking status: Never Smoker  . Smokeless tobacco: Never Used  Vaping Use  . Vaping Use: Never used  Substance and Sexual Activity  . Alcohol use: No  . Drug use: No  . Sexual activity: Yes    Partners: Male    Birth control/protection: Surgical    Comment: tubal  Other Topics Concern  . Not on file  Social History Narrative  . Not on file   Social Determinants of Health   Financial Resource Strain:   . Difficulty of Paying Living Expenses:   Food Insecurity:   . Worried About Sales executive in the Last Year:   . Arboriculturist in the Last Year:   Transportation Needs:   . Film/video editor (Medical):   Marland Kitchen Lack of Transportation (Non-Medical):   Physical Activity:   . Days of Exercise per Week:   . Minutes of Exercise per Session:   Stress:   . Feeling of Stress :   Social Connections:   . Frequency of Communication with Friends and Family:   . Frequency of Social Gatherings with Friends and Family:   . Attends Religious Services:   . Active Member of Clubs or Organizations:   . Attends Archivist Meetings:   Marland Kitchen Marital Status:    Past Surgical History:  Procedure Laterality Date  . ANTERIOR CERVICAL DECOMP/DISCECTOMY FUSION  07/07/2012   Procedure: ANTERIOR CERVICAL DECOMPRESSION/DISCECTOMY FUSION 2 LEVELS;  Surgeon: Floyce Stakes, MD;  Location: [facility];  Service: Neurosurgery;  Laterality: N/A;  Cervical four-five, five - six  Anterior cervical decompression/diskectomy/fusion/plate  . APPENDECTOMY  1986  . BOWEL RESECTION N/A 07/29/2013   Procedure: serosal repair;  Surgeon: Adin Hector, MD;  Location: [facility];  Service: General;  Laterality: N/A;  . CARPAL TUNNEL RELEASE Bilateral   . LAPAROSCOPY N/A 07/29/2013   Procedure: diagnostic laporoscopy;  Surgeon: Adin Hector, MD;  Location: [facility];  Service: General;  Laterality: N/A;  . LAPAROSCOPY N/A 08/16/2013   Procedure: LAPAROSCOPY DIAGNOSTIC/LYSIS OF ADHESIONS;  Surgeon: Adin Hector, MD;  Location: [facility];  Service: General;  Laterality: N/A;  . LAPAROTOMY N/A 08/16/2013   Procedure: EXPLORATORY LAPAROTOMY/SMALL BOWEL RESECTION (JEJUNUM);  Surgeon: Adin Hector, MD;  [facility];  Service: General;  Laterality: N/A;  . LUMBAR SPINE SURGERY  2010   x 3  . LYSIS OF ADHESION  2003   Dr. Irving Shows  . LYSIS OF ADHESION N/A 07/29/2013   Procedure: LYSIS OF ADHESION;  Surgeon: Adin Hector, MD;  Location: [facility];  Service: General;  Laterality: N/A;  . OOPHORECTOMY    .  PARTIAL HYSTERECTOMY  1990s?   [place], [place]  . SPINAL CORD STIMULATOR IMPLANT    . TRIGGER FINGER RELEASE  2009   right pinkie finger  . TUBAL LIGATION  1994   Past Medical History:  Diagnosis Date  . Anemia   . Asthma   .  Asthma flare 04/09/2013  . Back pain   . Bronchitis   . Chronic abdominal pain   . Chronic constipation   . Constipation due to opioid therapy   . Depression   . Depression, major, single episode, severe ([place]) 10/03/2018   PHQ 9 score of 15  . Diabetes mellitus without complication ([place])   . Diabetes mellitus, type II ([place])   . DVT (deep venous thrombosis) ([place]) 2010  . GERD (gastroesophageal reflux disease)   . Heart murmur    no cardiologist  . Helicobacter pylori gastritis 06/11/2013   Colonoscopy Dr. Hilarie Fredrickson  . Hypertension   . IBS (irritable bowel syndrome)   . Migraine headache   . Neuropathy   . Obesity   . Obsessive-compulsive disorder   . PSYCHOTIC D/O W/HALLUCINATIONS CONDS CLASS ELSW 03/04/2010   Qualifier: Diagnosis of  By: Moshe Cipro MD, Joycelyn Schmid    . PTSD (post-traumatic stress disorder)   . SBO (small bowel obstruction) ([place]) 08/09/2013  . Seasonal allergies 12/10/2012  . Seizures ([place])   . Shortness of breath    Temp 98.5 F (36.9 C)   Ht 5\' 6"  (1.676 m)   Wt 180 lb 9.6 oz (81.9 kg)   SpO2 98%   BMI 29.15 kg/m   Opioid Risk Score:   Fall Risk Score:  `1  Depression screen PHQ 2/9  Depression screen [facility] 2/9 05/12/2020 05/01/2020 04/17/2020 02/23/2020 02/18/2020 02/01/2020 12/28/2019  Decreased Interest 0 0 0 0 0 0 0  Down, Depressed, Hopeless 0 0 0 0 0 0 0  PHQ - 2 Score 0 0 0 0 0 0 0  Altered sleeping - 0 - - - - 0  Tired, decreased energy - 0 - - - - 0  Change in appetite - 0 - - - - 0  Feeling bad or failure about yourself  - 0 - - - - 0  Trouble concentrating - 0 - - - - 0  Moving slowly or fidgety/restless - 0 - - - - 0  Suicidal thoughts - 0 - - - - 0  PHQ-9 Score - 0 - - - - 0  Difficult doing work/chores - Not difficult at all  - - - - Not difficult at all  Some encounter information is confidential and restricted. Go to Review Flowsheets activity to see all data.  Some recent data might be hidden   Review of Systems  Constitutional: Negative.   HENT: Negative.   Eyes: Negative.   Respiratory: Positive for wheezing.   Cardiovascular: Negative.   Endocrine: Negative.   Genitourinary: Negative.   Musculoskeletal: Positive for gait problem.       Spasms  Allergic/Immunologic: Negative.   Neurological: Positive for weakness.       Tingling & numbness  Hematological: Negative.   Psychiatric/Behavioral: Negative.        Objective:   Physical Exam  Gen: no distress, normal appearing HEENT: oral mucosa pink and moist, NCAT Cardio: Reg rate Chest: normal effort, normal rate of breathing Abd: soft, non-distended Ext: no edema Skin: intact Neuro: Alert and oriented x3.  5/5 left lower extremity strength.  4/5 right lower extremity strength- pain limited.  +Slump test on left for pain radiating in S1 nerve distribution.  Tenderness to palpation over right buttock.  Psych: pleasant, normal affect     Assessment & Plan:  Mrs. Hamil is a 49 year old woman who follows for failed back syndrome, who presents today for exacerbation of right buttock pain.  R  buttock pain: -Radiates in S1 distribution, associated with weakness on right side, +Slump test. -Recommend Lumbar MRI. If positive for S1 nerve root compression, patient may be a good candidate for S1 nerve root injection. She has tried and failed physical therapy and medication management for this condition.   Failed back syndrome: -Provided refill of Oxycodone 10mg  BID. She has been on this dose for many years and it has helped her cope with her pain and maintain her job. No negative side effects.  RTC in 1 month. I will call before with MRI results.

## 2020-05-12 NOTE — Progress Notes (Signed)
Subjective:    Patient ID: Jane English, female    DOB: 1971/09/11, 49 y.o.   MRN: [id number]  HPI  49 year old woman with chronic pain after failed back surgery. She would like to be able to spend more time with her grandchildren, drive. Her pain was better controlled in the past but when she switched doctors her pain increased. Pain is now better controlled again. Currently rates as 8 out of 10 compared to 10/10 prior office visit.   I have previously administered viscosupplementation injection to painful knee with moderate relief.  Her pain is now worst in the lower back and neck and she has tried Voltaren gel. She does have shooting pain into her lower legs, as well as pain in bilateral knees.    She has an allergy to Gabapentin. She was prescribed steroids to help reduce inflammatory pain but she does not like taking them because they make her gain weight.    Her pain and function have been improved with Roxicodone. She has been taking 2 tablets twice per day and has been able to engage in her daily ADLs. She is currently undergoing a divorce and this is a major source of stress for her, though she has a great support system of her 4 children and 2 grandchildren, cousin, and best friend. She also is thrilled about the birth of her 9rd grandson this past month and shows me pictures of him. She feels better able to handle her separation from her husband now. Her sources of joy are spending time with her family, especially her grandchildren, watching TV.   She has been going up and down her 17 stairs for exercise every day except when it rains outside. She has had a 11 lbs weight loss since I saw her 2 months ago! Current weight is 181 lbs.   She had spinal cord stimulator placed in 2007 and it has been nonfunctional for some time, she was told possibly due to scar tissue. She has since seen Dr. Clydell Hakim and was told a keloid likely formed around her neurostimulator and it was best to leave  as is.    She has followed with rheumatology and had labs and XRs done. She describes bilateral and, shoulder, and knee pain,worst in her right knee. She has benefitted from injection to her knee in the past and was interested in OT for her hand arthritis, but it was making her pain worse so she discontinued it.   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   Pain Inventory Average Pain 8 Pain Right Now 9 My pain is sharp, burning, dull, stabbing, tingling and aching  In the last 24 hours, has pain interfered with the following? General activity 9 Relation with others 9 Enjoyment of life 9 What TIME of day is your pain at its worst? varies Sleep (in general) Fair  Pain is worse with: walking, sitting, standing and some activites Pain improves with: heat/ice and medication Relief from Meds: 8  Mobility how many minutes can you walk? 2 ability to climb steps?  yes do you drive?  yes  Function employed # of hrs/week 21 disabled: date disabled 2010  Neuro/Psych numbness tingling spasms  Prior Studies Any changes since last visit?  no  Physicians involved in your care Pulmonologist, Opthalmologist   Family History  Problem Relation Age of Onset  . Lung cancer Father   . Stomach cancer  Father   . Esophageal cancer Father   . Alcohol abuse Father   . Mental illness Father   . Diabetes Sister   . Hypertension Sister   . Bipolar disorder Sister   . Schizophrenia Sister   . Diabetes Sister   . Alcohol abuse Brother   . Hypertension Brother   . Kidney disease Brother   . Diabetes Brother   . Drug abuse Brother   . Mental illness Brother   . Alcohol abuse Brother   . Alcohol abuse Brother   . Hypertension Brother   . Diabetes Brother   . Alcohol abuse Brother   . Physical abuse Mother   . Alcohol abuse Mother   . Cirrhosis Mother   . Mental illness Brother        in [place]  . Drug abuse Sister   . HIV Sister   .  Alcohol abuse Brother   . Pneumonia Sister        died as a baby  . Alcohol abuse Brother   . Bipolar disorder Brother   . Bipolar disorder Daughter   . Bipolar disorder Son   . Bipolar disorder Son   . Hypertension Brother   . Bipolar disorder Brother   . Drug abuse Brother   . Alcohol abuse Brother   . Bipolar disorder Brother   . ADD / ADHD Neg Hx   . Anxiety disorder Neg Hx   . Dementia Neg Hx   . Depression Neg Hx   . OCD Neg Hx   . Seizures Neg Hx   . Paranoid behavior Neg Hx   . Colon cancer Neg Hx    Social History   Socioeconomic History  . Marital status: Married    Spouse name: Not on file  . Number of children: 4  . Years of education: Not on file  . Highest education level: Not on file  Occupational History  . Not on file  Tobacco Use  . Smoking status: Never Smoker  . Smokeless tobacco: Never Used  Vaping Use  . Vaping Use: Never used  Substance and Sexual Activity  . Alcohol use: No  . Drug use: No  . Sexual activity: Yes    Partners: Male    Birth control/protection: Surgical    Comment: tubal  Other Topics Concern  . Not on file  Social History Narrative  . Not on file   Social Determinants of Health   Financial Resource Strain:   . Difficulty of Paying Living Expenses:   Food Insecurity:   . Worried About Charity fundraiser in the Last Year:   . Arboriculturist in the Last Year:   Transportation Needs:   . Film/video editor (Medical):   Marland Kitchen Lack of Transportation (Non-Medical):   Physical Activity:   . Days of Exercise per Week:   . Minutes of Exercise per Session:   Stress:   . Feeling of Stress :   Social Connections:   . Frequency of Communication with Friends and Family:   . Frequency of Social Gatherings with Friends and Family:   . Attends Religious Services:   . Active Member of Clubs or Organizations:   . Attends Archivist Meetings:   Marland Kitchen Marital Status:    Past Surgical History:  Procedure Laterality Date   . ANTERIOR CERVICAL DECOMP/DISCECTOMY FUSION  07/07/2012   Procedure: ANTERIOR CERVICAL DECOMPRESSION/DISCECTOMY FUSION 2 LEVELS;  Surgeon: Floyce Stakes, MD;  Location: [facility];  Service: Neurosurgery;  Laterality: N/A;  Cervical four-five, five - six  Anterior cervical decompression/diskectomy/fusion/plate  . APPENDECTOMY  1986  . BOWEL RESECTION N/A 07/29/2013   Procedure: serosal repair;  Surgeon: Adin Hector, MD;  [facility]: [facility];  Service: General;  Laterality: N/A;  . CARPAL TUNNEL RELEASE Bilateral   . LAPAROSCOPY N/A 07/29/2013   Procedure: diagnostic laporoscopy;  Surgeon: Adin Hector, MD;  [facility]: [facility];  Service: General;  Laterality: N/A;  . LAPAROSCOPY N/A 08/16/2013   Procedure: LAPAROSCOPY DIAGNOSTIC/LYSIS OF ADHESIONS;  Surgeon: Adin Hector, MD;  [facility];  Service: General;  Laterality: N/A;  . LAPAROTOMY N/A 08/16/2013   Procedure: EXPLORATORY LAPAROTOMY/SMALL BOWEL RESECTION (JEJUNUM);  Surgeon: Adin Hector, MD;  [facility];  Service: General;  Laterality: N/A;  . LUMBAR SPINE SURGERY  2010   x 3  . LYSIS OF ADHESION  2003   Dr. Irving Shows  . LYSIS OF ADHESION N/A 07/29/2013   Procedure: LYSIS OF ADHESION;  Surgeon: Adin Hector, MD;  [facility];  Service: General;  Laterality: N/A;  . OOPHORECTOMY    . PARTIAL HYSTERECTOMY  1990s?   [place], [place]  . SPINAL CORD STIMULATOR IMPLANT    . TRIGGER FINGER RELEASE  2009   right pinkie finger  . TUBAL LIGATION  1994   Past Medical History:  Diagnosis Date  . Anemia   . Asthma   . Asthma flare 04/09/2013  . Back pain   . Bronchitis   . Chronic abdominal pain   . Chronic constipation   . Constipation due to opioid therapy   . Depression   . Depression, major, single episode, severe ([place]) 10/03/2018   PHQ 9 score of 15  . Diabetes mellitus without complication ([place])   . Diabetes mellitus, type II ([place])   . DVT (deep venous thrombosis) ([place]) 2010  . GERD  (gastroesophageal reflux disease)   . Heart murmur    no cardiologist  . Helicobacter pylori gastritis 06/11/2013   Colonoscopy Dr. Hilarie Fredrickson  . Hypertension   . IBS (irritable bowel syndrome)   . Migraine headache   . Neuropathy   . Obesity   . Obsessive-compulsive disorder   . PSYCHOTIC D/O W/HALLUCINATIONS CONDS CLASS ELSW 03/04/2010   Qualifier: Diagnosis of  By: Moshe Cipro MD, Joycelyn Schmid    . PTSD (post-traumatic stress disorder)   . SBO (small bowel obstruction) ([place]) 08/09/2013  . Seasonal allergies 12/10/2012  . Seizures ([place])   . Shortness of breath    Temp 98.5 F (36.9 C)   Ht 5\' 6"  (1.676 m)   Wt 180 lb 9.6 oz (81.9 kg)   SpO2 98%   BMI 29.15 kg/m   Opioid Risk Score:   Fall Risk Score:  `1  Depression screen PHQ 2/9  Depression screen [facility] 2/9 05/01/2020 04/17/2020 02/23/2020 02/18/2020 02/01/2020 12/28/2019 10/27/2019  Decreased Interest 0 0 0 0 0 0 0  Down, Depressed, Hopeless 0 0 0 0 0 0 0  PHQ - 2 Score 0 0 0 0 0 0 0  Altered sleeping 0 - - - - 0 -  Tired, decreased energy 0 - - - - 0 -  Change in appetite 0 - - - - 0 -  Feeling bad or failure about yourself  0 - - - - 0 -  Trouble concentrating 0 - - - - 0 -  Moving slowly or fidgety/restless 0 - - - - 0 -  Suicidal thoughts 0 - - - -  0 -  PHQ-9 Score 0 - - - - 0 -  Difficult doing work/chores Not difficult at all - - - - Not difficult at all -  Some encounter information is confidential and restricted. Go to Review Flowsheets activity to see all data.  Some recent data might be hidden    Review of Systems  Constitutional: Positive for diaphoresis.  Respiratory: Positive for shortness of breath and wheezing.   Neurological: Positive for numbness.       Spasms Tingling   All other systems reviewed and are negative.      Objective:   Physical Exam Gen: no distress, normal appearing, evidence of weight loss.  HEENT: oral mucosa pink and moist, NCAT Cardio: Reg rate Chest: normal effort, normal rate of  breathing Abd: soft, non-distended Ext: no edema Skin: neurostimulator palpable in right lower back Neuro: AOx3 Follows commands.  MSK: Normal gait and tandem walk. Full ROM of lumbar spine on flexion, limited in extension. Both flexion and extension result in pain, but less than last visit. +Slump test on the left, which produces pain down the leg to the calf in the S1 distribution.  +bilateral hand tremor is much improved to resolved.  Psych: pleasant, normal affect      Assessment & Plan:  49 year old woman with chronic pain after failed back surgery.   --She has severe pain, worse in her lower back, cervical spine, and knee. Also has obesity which contributes to her pain. Pain has continued to improve with medication and after Synvisc-One injection to knee.    -Reviewed MRI lumbar spine from 2013 with shows the following: 1.  Stable appearance of the lumbar spine status post L5-S1 PLIF. 2.  No significant adjacent segment disease, disc herniation, acquired spinal stenosis or nerve root encroachment. Mild facet degenerative changes inferiorly are stable. 3.  No abnormal intradural enhancement.   --She has been on Oxycodone in the past and had functional benefits. She was able to walk more, about 2 miles per day. She as tried surgery, injections, and neuropathic pain medications without relief or with negative side effects. After UDS and contract, I prescribed Roxicodone 10mg  BID. Discussed that our goals are functional improvement and not long term dependence on oxycodone. Discussed creating a daily exercise regimen for pain control and overall health. She has been doing her 17 stairs daily and has lost 11 lbs in 2 months!   --She has seen Dr. Clydell Hakim at [facility] and told that there is likely keloid formation around her inactive pain stimulation system, so it would be best to leave as is.     --Life stressors. Discussed at length her stress regarding her divorce, as well as her  sources of support and joy. Encouraged social interaction and physical activity. She shares with me pictures of her children and newborn grandson    All questions were encouraged and answered. Follow up with me in 1 month.

## 2020-05-14 ENCOUNTER — Encounter: Payer: Self-pay | Admitting: Physical Medicine and Rehabilitation

## 2020-05-16 ENCOUNTER — Encounter: Payer: Medicare HMO | Admitting: Physical Medicine and Rehabilitation

## 2020-05-16 ENCOUNTER — Ambulatory Visit: Payer: Medicare HMO

## 2020-06-01 ENCOUNTER — Telehealth: Payer: Self-pay | Admitting: *Deleted

## 2020-06-01 NOTE — Telephone Encounter (Signed)
Advised patient of approval for free medication from [facility] to [facility] and delivery scheduled for 8/5 to contact office that after noon to make sure of medication arrival and schedule injection

## 2020-06-09 ENCOUNTER — Ambulatory Visit: Payer: Medicare HMO

## 2020-06-23 ENCOUNTER — Encounter: Payer: Medicare HMO | Attending: Physical Medicine and Rehabilitation | Admitting: Physical Medicine and Rehabilitation

## 2020-06-23 ENCOUNTER — Encounter: Payer: Self-pay | Admitting: Physical Medicine and Rehabilitation

## 2020-06-23 ENCOUNTER — Other Ambulatory Visit: Payer: Self-pay

## 2020-06-23 ENCOUNTER — Ambulatory Visit (INDEPENDENT_AMBULATORY_CARE_PROVIDER_SITE_OTHER): Payer: Medicare HMO

## 2020-06-23 ENCOUNTER — Ambulatory Visit: Payer: Medicare HMO

## 2020-06-23 VITALS — BP 111/74 | HR 90 | Temp 98.7°F | Ht 66.0 in | Wt 183.0 lb

## 2020-06-23 DIAGNOSIS — G894 Chronic pain syndrome: Secondary | ICD-10-CM

## 2020-06-23 DIAGNOSIS — M961 Postlaminectomy syndrome, not elsewhere classified: Secondary | ICD-10-CM

## 2020-06-23 DIAGNOSIS — M19042 Primary osteoarthritis, left hand: Secondary | ICD-10-CM | POA: Insufficient documentation

## 2020-06-23 DIAGNOSIS — J4550 Severe persistent asthma, uncomplicated: Secondary | ICD-10-CM | POA: Diagnosis not present

## 2020-06-23 DIAGNOSIS — Z79891 Long term (current) use of opiate analgesic: Secondary | ICD-10-CM | POA: Diagnosis not present

## 2020-06-23 DIAGNOSIS — M19041 Primary osteoarthritis, right hand: Secondary | ICD-10-CM | POA: Insufficient documentation

## 2020-06-23 DIAGNOSIS — M7918 Myalgia, other site: Secondary | ICD-10-CM | POA: Diagnosis not present

## 2020-06-23 DIAGNOSIS — M5416 Radiculopathy, lumbar region: Secondary | ICD-10-CM | POA: Diagnosis not present

## 2020-06-23 DIAGNOSIS — M5010 Cervical disc disorder with radiculopathy, unspecified cervical region: Secondary | ICD-10-CM | POA: Diagnosis not present

## 2020-06-23 DIAGNOSIS — M5136 Other intervertebral disc degeneration, lumbar region: Secondary | ICD-10-CM | POA: Diagnosis not present

## 2020-06-23 DIAGNOSIS — Z5181 Encounter for therapeutic drug level monitoring: Secondary | ICD-10-CM | POA: Insufficient documentation

## 2020-06-23 MED ORDER — OXYCODONE HCL 5 MG PO TABS: 10.0000 mg | ORAL_TABLET | Freq: Two times a day (BID) | ORAL | 0 refills | Status: DC

## 2020-06-23 MED ORDER — MEPOLIZUMAB 100 MG ~~LOC~~ SOLR
100.0000 mg | SUBCUTANEOUS | Status: DC
  Administered 2020-06-23: 100 mg via SUBCUTANEOUS

## 2020-06-23 MED ORDER — MAGNESIUM 400 MG PO CAPS: 1.0000 | ORAL_CAPSULE | Freq: Two times a day (BID) | ORAL | 10 refills | Status: DC

## 2020-06-23 MED ORDER — EPINEPHRINE 0.3 MG/0.3ML IJ SOAJ: 0.3000 mg | Freq: Once | INTRAMUSCULAR | 1 refills | Status: AC

## 2020-06-23 NOTE — Progress Notes (Signed)
Immunotherapy   Patient Details  Name: Jane English MRN: [id number] Date of Birth: 1971-05-12  06/23/2020  [name] started injections for  Nucala 100 mg given into the right arm. Patient tolerated injection well and was instructed to wait her 30 mins in the office. Patient did not have any issues following her injection. Patient stated she is doing injections at home and was taught how to do the medication.   Patient gets injection every 28 days Epi-Pen:Epi-Pen Available  Consent signed and patient instructions given.   Festus Holts Abubakr Wieman 06/23/2020, 10:45 AM

## 2020-06-23 NOTE — Progress Notes (Signed)
Subjective:    Patient ID: Jane English, female    DOB: 10-Feb-1971, 49 y.o.   MRN: [id number]  HPI Mrs. Quest presents for follow up of failed back syndrome, right hip pain, and cervical pain.   R buttock pain: Pain radiates posteriorly down her thigh- it does not extend further. It feels constant, sharp, burning, dull, stabbing, tingling, and aching. It is worse with with walking, bending, sitting, and standing.  Failed back syndrome: She requires a refill of her oxycodone for her chronic pain related to this issue.   Asthma: She received 100mg  Nucala for her severe persistent asthma today. Immunotherapy note reviewed.   Muscle cramps: She has been taking magnesium 400mg  daly and would like to take it twice per day.   Pain Inventory Average Pain 9 Pain Right Now 9 My pain is constant, sharp, burning, dull, stabbing, tingling and aching  In the last 24 hours, has pain interfered with the following? General activity 9 Relation with others 9 Enjoyment of life 9 What TIME of day is your pain at its worst? All the time. Sleep (in general) Poor  Pain is worse with: walking, bending, sitting and standing Pain improves with: rest, heat/ice and medication Relief from Meds: 0     Family History  Problem Relation Age of Onset  . Lung cancer Father   . Stomach cancer Father   . Esophageal cancer Father   . Alcohol abuse Father   . Mental illness Father   . Diabetes Sister   . Hypertension Sister   . Bipolar disorder Sister   . Schizophrenia Sister   . Diabetes Sister   . Alcohol abuse Brother   . Hypertension Brother   . Kidney disease Brother   . Diabetes Brother   . Drug abuse Brother   . Mental illness Brother   . Alcohol abuse Brother   . Alcohol abuse Brother   . Hypertension Brother   . Diabetes Brother   . Alcohol abuse Brother   . Physical abuse Mother   . Alcohol abuse Mother   . Cirrhosis Mother   . Mental illness Brother        in [place]  . Drug abuse  Sister   . HIV Sister   . Alcohol abuse Brother   . Pneumonia Sister        died as a baby  . Alcohol abuse Brother   . Bipolar disorder Brother   . Bipolar disorder Daughter   . Bipolar disorder Son   . Bipolar disorder Son   . Hypertension Brother   . Bipolar disorder Brother   . Drug abuse Brother   . Alcohol abuse Brother   . Bipolar disorder Brother   . ADD / ADHD Neg Hx   . Anxiety disorder Neg Hx   . Dementia Neg Hx   . Depression Neg Hx   . OCD Neg Hx   . Seizures Neg Hx   . Paranoid behavior Neg Hx   . Colon cancer Neg Hx    Social History   Socioeconomic History  . Marital status: Married    Spouse name: Not on file  . Number of children: 4  . Years of education: Not on file  . Highest education level: Not on file  Occupational History  . Not on file  Tobacco Use  . Smoking status: Never Smoker  . Smokeless tobacco: Never Used  Vaping Use  . Vaping Use: Never used  Substance and Sexual Activity  .  Alcohol use: No  . Drug use: No  . Sexual activity: Yes    Partners: Male    Birth control/protection: Surgical    Comment: tubal  Other Topics Concern  . Not on file  Social History Narrative  . Not on file   Social Determinants of Health   Financial Resource Strain:   . Difficulty of Paying Living Expenses: Not on file  Food Insecurity:   . Worried About Charity fundraiser in the Last Year: Not on file  . Ran Out of Food in the Last Year: Not on file  Transportation Needs:   . Lack of Transportation (Medical): Not on file  . Lack of Transportation (Non-Medical): Not on file  Physical Activity:   . Days of Exercise per Week: Not on file  . Minutes of Exercise per Session: Not on file  Stress:   . Feeling of Stress : Not on file  Social Connections:   . Frequency of Communication with Friends and Family: Not on file  . Frequency of Social Gatherings with Friends and Family: Not on file  . Attends Religious Services: Not on file  . Active  Member of Clubs or Organizations: Not on file  . Attends Archivist Meetings: Not on file  . Marital Status: Not on file   Past Surgical History:  Procedure Laterality Date  . ANTERIOR CERVICAL DECOMP/DISCECTOMY FUSION  07/07/2012   Procedure: ANTERIOR CERVICAL DECOMPRESSION/DISCECTOMY FUSION 2 LEVELS;  Surgeon: Floyce Stakes, MD;  Location: [facility];  Service: Neurosurgery;  Laterality: N/A;  Cervical four-five, five - six  Anterior cervical decompression/diskectomy/fusion/plate  . APPENDECTOMY  1986  . BOWEL RESECTION N/A 07/29/2013   Procedure: serosal repair;  Surgeon: Adin Hector, MD;  Location: [facility];  Service: General;  Laterality: N/A;  . CARPAL TUNNEL RELEASE Bilateral   . LAPAROSCOPY N/A 07/29/2013   Procedure: diagnostic laporoscopy;  Surgeon: Adin Hector, MD;  Location: [facility];  Service: General;  Laterality: N/A;  . LAPAROSCOPY N/A 08/16/2013   Procedure: LAPAROSCOPY DIAGNOSTIC/LYSIS OF ADHESIONS;  Surgeon: Adin Hector, MD;  Location: [facility];  Service: General;  Laterality: N/A;  . LAPAROTOMY N/A 08/16/2013   Procedure: EXPLORATORY LAPAROTOMY/SMALL BOWEL RESECTION (JEJUNUM);  Surgeon: Adin Hector, MD;  Location: [facility];  Service: General;  Laterality: N/A;  . LUMBAR SPINE SURGERY  2010   x 3  . LYSIS OF ADHESION  2003   Dr. Irving Shows  . LYSIS OF ADHESION N/A 07/29/2013   Procedure: LYSIS OF ADHESION;  Surgeon: Adin Hector, MD;  Location: [facility];  Service: General;  Laterality: N/A;  . OOPHORECTOMY    . PARTIAL HYSTERECTOMY  1990s?   [place], [place]  . SPINAL CORD STIMULATOR IMPLANT    . TRIGGER FINGER RELEASE  2009   right pinkie finger  . TUBAL LIGATION  1994   Past Medical History:  Diagnosis Date  . Anemia   . Asthma   . Asthma flare 04/09/2013  . Back pain   . Bronchitis   . Chronic abdominal pain   . Chronic constipation   . Constipation due to opioid therapy   . Depression   . Depression, major, single episode, severe ([place])  10/03/2018   PHQ 9 score of 15  . Diabetes mellitus without complication ([place])   . Diabetes mellitus, type II ([place])   . DVT (deep venous thrombosis) ([place]) 2010  . GERD (gastroesophageal reflux disease)   . Heart murmur  no cardiologist  . Helicobacter pylori gastritis 06/11/2013   Colonoscopy Dr. Hilarie Fredrickson  . Hypertension   . IBS (irritable bowel syndrome)   . Migraine headache   . Neuropathy   . Obesity   . Obsessive-compulsive disorder   . PSYCHOTIC D/O W/HALLUCINATIONS CONDS CLASS ELSW 03/04/2010   Qualifier: Diagnosis of  By: Moshe Cipro MD, Joycelyn Schmid    . PTSD (post-traumatic stress disorder)   . SBO (small bowel obstruction) ([place]) 08/09/2013  . Seasonal allergies 12/10/2012  . Seizures ([place])   . Shortness of breath    There were no vitals taken for this visit.  Opioid Risk Score:   Fall Risk Score:  `1  Depression screen PHQ 2/9  Depression screen [facility] 2/9 05/12/2020 05/01/2020 04/17/2020 02/23/2020 02/18/2020 02/01/2020 12/28/2019  Decreased Interest 0 0 0 0 0 0 0  Down, Depressed, Hopeless 0 0 0 0 0 0 0  PHQ - 2 Score 0 0 0 0 0 0 0  Altered sleeping - 0 - - - - 0  Tired, decreased energy - 0 - - - - 0  Change in appetite - 0 - - - - 0  Feeling bad or failure about yourself  - 0 - - - - 0  Trouble concentrating - 0 - - - - 0  Moving slowly or fidgety/restless - 0 - - - - 0  Suicidal thoughts - 0 - - - - 0  PHQ-9 Score - 0 - - - - 0  Difficult doing work/chores - Not difficult at all - - - - Not difficult at all  Some encounter information is confidential and restricted. Go to Review Flowsheets activity to see all data.  Some recent data might be hidden   Review of Systems  Constitutional: Negative.   HENT: Negative.   Eyes: Negative.   Respiratory: Positive for wheezing.   Cardiovascular: Negative.   Endocrine: Negative.   Genitourinary: Negative.   Musculoskeletal: Positive for gait problem.       Spasms  Allergic/Immunologic: Negative.   Neurological: Positive for  weakness.       Tingling & numbness  Hematological: Negative.   Psychiatric/Behavioral: Negative.        Objective:   Physical Exam Gen: no distress, normal appearing HEENT: oral mucosa pink and moist, NCAT Cardio: Reg rate Chest: normal effort, normal rate of breathing Abd: soft, non-distended Ext: no edema Neuro: Alert and oriented x3.  5/5 left lower extremity strength.  4/5 right lower extremity strength- pain limited.  +Slump test on left for pain radiating in S1 nerve distribution.  Tenderness to palpation over right buttock.  Psych: pleasant, normal affect     Assessment & Plan:  Mrs. Ken is a 49 year old woman who follows for failed back syndrome, who presents today for exacerbation of right buttock pain.  R buttock pain: -Radiates in S1 distribution, associated with weakness on right side, +Slump test. -She has spinal cord stimulator which was a barrier to getting the MRI.  Failed back syndrome: -Provided refill of Oxycodone 10mg  BID. She has been on this dose for many years and it has helped her cope with her pain and maintain her job. No negative side effects. -Discussed [employer] PNS system as an option of pain treatment via neuromodulation. Provided following link for patient to learn more about the system: [email or web address]   Recent dental surgery: Did not take any paid meds from dentist.   Bilateral knee OA; Can consider viscosupplementation injections again next visit. She had great  benefit from this.   Mood: Very positive. Very excited about her grandchildren.   RTC in 1 month.

## 2020-06-23 NOTE — Patient Instructions (Signed)
[email or web address]

## 2020-06-28 ENCOUNTER — Other Ambulatory Visit: Payer: Self-pay | Admitting: Family Medicine

## 2020-07-05 ENCOUNTER — Other Ambulatory Visit: Payer: Self-pay | Admitting: Physical Medicine and Rehabilitation

## 2020-07-05 MED ORDER — MAGNESIUM 400 MG PO CAPS: 1.0000 | ORAL_CAPSULE | Freq: Two times a day (BID) | ORAL | 10 refills | Status: DC

## 2020-07-07 ENCOUNTER — Telehealth: Payer: Self-pay | Admitting: Family Medicine

## 2020-07-07 ENCOUNTER — Telehealth: Payer: Self-pay | Admitting: *Deleted

## 2020-07-07 NOTE — Telephone Encounter (Signed)
Patient called with concerns with getting the COVID vaccine. She states that she needs to get the vaccine for a new job but she is very concerned given her asthma and the medications she is taking. Patient has been scheduled for a telephone visit with Dr. Maudie Mercury next Thursday in the [facility] office to discuss this further. She was wanting a letter exempting her from getting the vaccine. I advised that we don't write the letter unless there are specific circumstances as to why the patient should not get the vaccine. Patient verbalize understanding and will discuss with further with Dr. Maudie Mercury.

## 2020-07-07 NOTE — Telephone Encounter (Signed)
Patient is wanting to get the Nucala auto injector so that she can self administer her injections. Please advise.

## 2020-07-07 NOTE — Telephone Encounter (Signed)
Patient called to ask what brand of flu shot we are giving and also due to her medical conditions can she get her covid vaccines ? [phone]

## 2020-07-07 NOTE — Telephone Encounter (Signed)
Already spoke to patient and advised new Rx sent to [facility] [place] and number given to patient to followup

## 2020-07-11 ENCOUNTER — Ambulatory Visit (INDEPENDENT_AMBULATORY_CARE_PROVIDER_SITE_OTHER): Payer: Medicare HMO

## 2020-07-11 ENCOUNTER — Telehealth (INDEPENDENT_AMBULATORY_CARE_PROVIDER_SITE_OTHER): Payer: Medicare HMO

## 2020-07-11 ENCOUNTER — Other Ambulatory Visit: Payer: Self-pay

## 2020-07-11 DIAGNOSIS — Z23 Encounter for immunization: Secondary | ICD-10-CM | POA: Diagnosis not present

## 2020-07-11 DIAGNOSIS — N3001 Acute cystitis with hematuria: Secondary | ICD-10-CM | POA: Diagnosis not present

## 2020-07-11 LAB — POCT URINALYSIS DIP (CLINITEK)
Bilirubin, UA: NEGATIVE
Glucose, UA: NEGATIVE mg/dL
Leukocytes, UA: NEGATIVE
Nitrite, UA: NEGATIVE
POC PROTEIN,UA: NEGATIVE
Spec Grav, UA: >=1.03 — AB (ref 1.010–1.025)
Urobilinogen, UA: 0.2 E.U./dL
pH, UA: 6 (ref 5.0–8.0)

## 2020-07-11 NOTE — Telephone Encounter (Signed)
Patient walked in to get a flu vaccine and then said she wanted her urine checked and left specimen. Only abnormality was blood trace intact. Will send for urinalysis with reflex.

## 2020-07-11 NOTE — Telephone Encounter (Signed)
noted 

## 2020-07-11 NOTE — Telephone Encounter (Signed)
Patient aware of flu type we are giving and that the moderna and pzifer vaccines are recommended and to space them a couple weeks from the flu vaccine

## 2020-07-12 ENCOUNTER — Other Ambulatory Visit (HOSPITAL_COMMUNITY)
Admission: RE | Admit: 2020-07-12 | Discharge: 2020-07-12 | Disposition: A | Discharge status: Home / Self Care | Payer: Medicare HMO | Source: Other Acute Inpatient Hospital | Attending: *Deleted | Admitting: *Deleted

## 2020-07-12 LAB — URINALYSIS, ROUTINE W REFLEX MICROSCOPIC
Bacteria, UA: NONE SEEN
Bilirubin Urine: NEGATIVE
Glucose, UA: NEGATIVE mg/dL
Ketones, ur: NEGATIVE mg/dL
Leukocytes,Ua: NEGATIVE
Nitrite: NEGATIVE
Protein, ur: NEGATIVE mg/dL
Specific Gravity, Urine: 1.029 (ref 1.005–1.030)
pH: 5 (ref 5.0–8.0)

## 2020-07-12 NOTE — Progress Notes (Signed)
RE: Jane English MRN: [id number] DOB: [birth year] Date of Telemedicine Visit: 07/13/2020  Referring provider: Fayrene Helper, MD Primary care provider: Fayrene Helper, MD  Chief Complaint: Follow-up (discuss covid vaccine )  Telemedicine Follow Up Visit via Telephone: I connected with [facility] for a follow up on 07/13/20 by telephone and verified that I am speaking with the correct person using two identifiers.   I discussed the limitations, risks, security and privacy concerns of performing an evaluation and management service by telephone and the availability of in person appointments. I also discussed with the patient that there may be a patient responsible charge related to this service. The patient expressed understanding and agreed to proceed.  Patient is at home/work. Provider is at the office.  Visit start time: 3:20PM Visit end time: 3:40PM Insurance consent/check in by: front desk Medical consent and medical assistant/nurse: Da'Shaunia R.  History of Present Illness: She is a 49 y.o. female, who is being followed for asthma on Nucala 150m SQ Q4 weeks, allergic rhinoconjunctivitis and pruritic rash. Her previous allergy office visit was on 04/18/2020 with Dr. KMaudie Mercury Today is a new complaint visit of discussing COVID-19 vaccination.  COVID-19 vaccine: Patient has concerns about getting her COVID-19 vaccine.  Any known reactions to polyethylene glycol or polysorbate?  No.  Any history of anaphylaxis to vaccinations: No. Patient just got flu shot with no issues this year.   Any history of reactions to injectable medications? No prior injectable medications.   Any history of anaphylaxis to colonoscopy preps (i.e.Miralax)? No.   Any history of dermal filler treatments in the last year? No.  Asthma: Patient received her second Nucala injection on 06/23/2020 as she was having issues with her insurance. Not sure if it's helping yet. She noticed some chest  tightness as well afterwards.   Currently on Symbicort 1628m 2 puffs BID, Spiriva 2 puffs once a day, Qvar 4049m2 puffs BID and using albuterol still on a daily basis especially at night.  No ER/urgent care or prednisone use.   Seasonal and perennial allergic rhinoconjunctivitis Patient has a dog at home and bathes her every 2 weeks. Taking Singulair, Claritin, Astelin and Flonase with some benefit. Interested in starting allergy injections as she can't return the dog.  Pruritic rash Using Vanicream hydrocortisone cream as needed with some benefit. She tried other OTC hydrocortisone cream with no benefit.   Assessment and Plan: AvoTeana a 49 31o. female with: Not well controlled severe persistent asthma Past history - Diagnosed with asthma over 30 years ago and using Symbicort 160 2 puffs twice a day and albuterol a few times a day. Patient has history of hospitalization with intubation in the past. CXR in March 2021 no active disease.  Interim history - started Nucala and had 2 injections to date. Not sure if helping yet. Having issues with paperwork. Possibly some chest tightness after second injection. Still using albuterol on a daily basis.  . Continue Nucala injections every 4 weeks - get the next one in the office to make sure there are no side effects. . I sent Tammy a message about the paperwork.  . Daily controller medication(s): Continue Symbicort 160m106m puffs twice a day with spacer and rinse mouth afterwards. o Continue Spiriva 1.25mc11mpuff once a day.  . Prior to physical activity: May use albuterol rescue inhaler 2 puffs 5 to 15 minutes prior to strenuous physical activities. . ResMarland Kitchenue medications: May use albuterol rescue inhaler 2 puffs  or nebulizer every 4 to 6 hours as needed for shortness of breath, chest tightness, coughing, and wheezing. Monitor frequency of use.  During upper respiratory infections/asthma flares: Start Qvar 76mg 2 puffs twice a day and rinse  mouth afterwards for 1-2 weeks. . Repeat spirometry at next visit. . Consider checking alpha-1 level.   Seasonal and perennial allergic rhinoconjunctivitis Past history - Perennial rhino conjunctivitis symptoms for 30+ years with worsening in the summer. Tried Singulair, zyrtec, Astelin, Patanol with minimal improvement in symptoms. Bloodwork was positive to cat, dog and tree pollen. Interim history - Interested in allergy injections as she has 1 dog at home now. Below regimen does not control symptoms all the time.  Continue Claritin 142mdaily. May take twice a day if needed.   Continue Singulair (montelukast) 1072maily.  Continue Astelin nasal spray 2 sprays per nostril 1-2 time a day as needed for runny nose.  Continue Fluticasone 1-2 sprays per nostril daily for nasal congestion.   Nasal saline spray (i.e., Simply Saline) or nasal saline lavage (i.e., NeilMed) is recommended as needed and prior to medicated nasal sprays.  You may use both nasal sprays back to back.   May use olopatadine eye drop 1 drop in each eye twice daily as needed for itchy/watery eyes.   Once your asthma is more stable we can discuss starting allergy injections for the cat, dog and tree pollen.   Pruritic rash Improved with Vanicream hydrocortisone cream. Other OTC hydrocortisone does not work.  Take pictures of the rash.  Continue proper skin care.   May use Vanicream hydrocortisone cream as needed.  May need to send in Rx for hydrocortisone as she can't find the above one over the counter.   Vaccine counseling  Recommend getting the COVID-19 vaccine.   Patient will schedule to receive in our office.   No contraindications based on history.  Return in about 1 week (around 07/20/2020).  Diagnostics: None.  Medication List:  Current Outpatient Medications  Medication Sig Dispense Refill  . albuterol (PROVENTIL) (2.5 MG/3ML) 0.083% nebulizer solution Take 3 mLs (2.5 mg total) by  nebulization every 6 (six) hours as needed for wheezing or shortness of breath. 150 mL 1  . albuterol (VENTOLIN HFA) 108 (90 Base) MCG/ACT inhaler TAKE 2 PUFFS BY MOUTH EVERY 6 HOURS AS NEEDED FOR WHEEZE OR SHORTNESS OF BREATH 18 g 4  . ALPRAZolam (XANAX) 0.5 MG tablet Take 1 tablet (0.5 mg total) by mouth at bedtime. 30 tablet 3  . amitriptyline (ELAVIL) 150 MG tablet Take 0.5 tablets (75 mg total) by mouth at bedtime. 30 tablet 1  . azelastine (ASTELIN) 0.1 % nasal spray PLACE 2 SPRAYS INTO BOTH NOSTRILS 2 (TWO) TIMES DAILY. USE IN EACH NOSTRIL AS DIRECTED 30 mL 4  . beclomethasone (QVAR REDIHALER) 40 MCG/ACT inhaler Inhale 2 puffs into the lungs 2 (two) times daily. 10.6 g 5  . budesonide-formoterol (SYMBICORT) 160-4.5 MCG/ACT inhaler Inhale 2 puffs into the lungs 2 (two) times daily. PRN 1 Inhaler 3  . cetirizine (ZYRTEC) 10 MG tablet Take 1 tablet (10 mg total) by mouth daily. 30 tablet 11  . cloNIDine (CATAPRES) 0.1 MG tablet TAKE 1 TABLET BY MOUTH EVERYDAY AT BEDTIME 90 tablet 1  . clotrimazole-betamethasone (LOTRISONE) cream APPLY TO AFFECTED AREA TWICE A DAY 45 g 5  . cyclobenzaprine (FLEXERIL) 10 MG tablet Take one tablet by mouth at bedtime , as needed , for back spasm 15 tablet 0  . fexofenadine-pseudoephedrine (ALLEGRA-D 24) 180-240 MG  24 hr tablet Take 1 tablet by mouth daily.    . fluticasone (FLONASE) 50 MCG/ACT nasal spray Place 1-2 sprays into both nostrils daily. 16 g 2  . LINZESS 290 MCG CAPS capsule TAKE 1 CAPSULE (290 MCG TOTAL) BY MOUTH DAILY BEFORE BREAKFAST. 90 capsule 2  . Magnesium 400 MG CAPS Take 1 tablet by mouth 2 (two) times daily. 60 capsule 10  . medroxyPROGESTERone (PROVERA) 10 MG tablet TAKE 1 TABLET BY MOUTH DAILY FOR 2 WEEKS. REPEAT AS DIRECTED BY PHYSICIAN EVERY 3 MONTHS    . montelukast (SINGULAIR) 10 MG tablet TAKE 1 TABLET BY MOUTH EVERYDAY AT BEDTIME 90 tablet 0  . NARCAN 4 MG/0.1ML LIQD nasal spray kit Place 4 mg into the nose as directed.  1  .  NONFORMULARY OR COMPOUNDED ITEM Apply 1-2 pumps, 3-4 times daily as needed. 1 each 0  . oxyCODONE (ROXICODONE) 5 MG immediate release tablet Take 2 tablets (10 mg total) by mouth 2 (two) times daily. 120 tablet 0  . pantoprazole (PROTONIX) 40 MG tablet Take 1 tablet (40 mg total) by mouth daily. 90 tablet 1  . phentermine (ADIPEX-P) 37.5 MG tablet Take 1 tablet (37.5 mg total) by mouth daily before breakfast. Take one half tablet once daily at breakfast for appetite suppression 30 tablet 1  . potassium chloride 20 MEQ/15ML (10%) SOLN Take 15 mLs (20 mEq total) by mouth 2 (two) times daily. 473 mL 1  . Tiotropium Bromide Monohydrate (SPIRIVA RESPIMAT) 1.25 MCG/ACT AERS Inhale 2 puffs into the lungs daily. 4 g 5  . triamterene-hydrochlorothiazide (MAXZIDE-25) 37.5-25 MG tablet TAKE 1 TABLET BY MOUTH EVERY DAY 90 tablet 1  . trolamine salicylate (ASPERCREME) 10 % cream Apply 1 application topically as needed for muscle pain.      Current Facility-Administered Medications  Medication Dose Route Frequency Provider Last Rate Last Admin  . Mepolizumab SOLR 100 mg  100 mg Subcutaneous Q28 days Garnet Sierras, DO   100 mg at 06/23/20 1105   Allergies: Allergies  Allergen Reactions  . Neurontin [Gabapentin] Nausea And Vomiting and Other (See Comments)    Sleep walking/hallucinations   . Penicillins Hives, Shortness Of Breath and Other (See Comments)    Has patient had a PCN reaction causing immediate rash, facial/tongue/throat swelling, SOB or lightheadedness with hypotension: Yes Has patient had a PCN reaction causing severe rash involving mucus membranes or skin necrosis: No Has patient had a PCN reaction that required hospitalization: No Has patient had a PCN reaction occurring within the last 10 years: Yes If all of the above answers are "NO", then may proceed with Cephalosporin use.  . Pregabalin Anaphylaxis, Shortness Of Breath, Diarrhea and Swelling    Lyrica  . Tramadol Other (See Comments)     Makes patient "delusional"  . Latex Rash   I reviewed her past medical history, social history, family history, and environmental history and no significant changes have been reported from her previous visit.  Review of Systems  Constitutional: Negative for appetite change, chills, fever and unexpected weight change.  HENT: Negative for congestion and rhinorrhea.   Eyes: Negative for itching.  Respiratory: Positive for cough, chest tightness and shortness of breath. Negative for wheezing.   Cardiovascular: Negative for chest pain.  Gastrointestinal: Negative for abdominal pain.  Genitourinary: Negative for difficulty urinating.  Skin: Positive for rash.  Allergic/Immunologic: Positive for environmental allergies. Negative for food allergies.   Objective: Physical Exam Not obtained as encounter was done via telephone.   Previous  notes and tests were reviewed.  I discussed the assessment and treatment plan with the patient. The patient was provided an opportunity to ask questions and all were answered. The patient agreed with the plan and demonstrated an understanding of the instructions. After visit summary/patient instructions available via mychart.   The patient was advised to call back or seek an in-person evaluation if the symptoms worsen or if the condition fails to improve as anticipated.  I provided 20 minutes of non-face-to-face time during this encounter.  It was my pleasure to participate in Jane English's care today. Please feel free to contact me with any questions or concerns.   Sincerely,  Rexene Alberts, DO Allergy & Immunology  [facility] office: [phone] [facility] office: [place] office: [phone]

## 2020-07-13 ENCOUNTER — Encounter: Payer: Self-pay | Admitting: Allergy

## 2020-07-13 ENCOUNTER — Ambulatory Visit (INDEPENDENT_AMBULATORY_CARE_PROVIDER_SITE_OTHER): Payer: Medicare HMO | Admitting: Allergy

## 2020-07-13 ENCOUNTER — Other Ambulatory Visit: Payer: Self-pay

## 2020-07-13 DIAGNOSIS — J4550 Severe persistent asthma, uncomplicated: Secondary | ICD-10-CM | POA: Diagnosis not present

## 2020-07-13 DIAGNOSIS — J302 Other seasonal allergic rhinitis: Secondary | ICD-10-CM

## 2020-07-13 DIAGNOSIS — L282 Other prurigo: Secondary | ICD-10-CM

## 2020-07-13 DIAGNOSIS — J3089 Other allergic rhinitis: Secondary | ICD-10-CM

## 2020-07-13 DIAGNOSIS — Z7189 Other specified counseling: Secondary | ICD-10-CM

## 2020-07-13 DIAGNOSIS — H1010 Acute atopic conjunctivitis, unspecified eye: Secondary | ICD-10-CM | POA: Diagnosis not present

## 2020-07-13 DIAGNOSIS — Z7185 Encounter for immunization safety counseling: Secondary | ICD-10-CM | POA: Insufficient documentation

## 2020-07-13 NOTE — Assessment & Plan Note (Signed)
Past history - Diagnosed with asthma over 30 years ago and using Symbicort 160 2 puffs twice a day and albuterol a few times a day. Patient has history of hospitalization with intubation in the past. CXR in March 2021 no active disease.  Interim history - started Nucala and had 2 injections to date. Not sure if helping yet. Having issues with paperwork. Possibly some chest tightness after second injection. Still using albuterol on a daily basis.  . Continue Nucala injections every 4 weeks - get the next one in the office to make sure there are no side effects. . I sent Tammy a message about the paperwork.  . Daily controller medication(s): Continue Symbicort 144mcg 2 puffs twice a day with spacer and rinse mouth afterwards. o Continue Spiriva 1.63mcg 2 puff once a day.  . Prior to physical activity: May use albuterol rescue inhaler 2 puffs 5 to 15 minutes prior to strenuous physical activities. Marland Kitchen Rescue medications: May use albuterol rescue inhaler 2 puffs or nebulizer every 4 to 6 hours as needed for shortness of breath, chest tightness, coughing, and wheezing. Monitor frequency of use.  During upper respiratory infections/asthma flares: Start Qvar 68mcg 2 puffs twice a day and rinse mouth afterwards for 1-2 weeks. . Repeat spirometry at next visit. . Consider checking alpha-1 level.

## 2020-07-13 NOTE — Assessment & Plan Note (Signed)
   Recommend getting the COVID-19 vaccine.   Patient will schedule to receive in our office.   No contraindications based on history.

## 2020-07-13 NOTE — Patient Instructions (Addendum)
Asthma: . Continue Nucala injections every 4 weeks - get the next one in the office to make sure there are no side effects. . I sent Tammy a message about the paperwork.  . Daily controller medication(s): Continue Symbicort 171mcg 2 puffs twice a day with spacer and rinse mouth afterwards. o Continue Spiriva 1.29mcg 2 puff once a day.  . Prior to physical activity: May use albuterol rescue inhaler 2 puffs 5 to 15 minutes prior to strenuous physical activities. Marland Kitchen Rescue medications: May use albuterol rescue inhaler 2 puffs or nebulizer every 4 to 6 hours as needed for shortness of breath, chest tightness, coughing, and wheezing. Monitor frequency of use.  During upper respiratory infections/asthma flares: Start Qvar 89mcg 2 puffs twice a day and rinse mouth afterwards for 1-2 weeks. . Asthma control goals:  o Full participation in all desired activities (may need albuterol before activity) o Albuterol use two times or less a week on average (not counting use with activity) o Cough interfering with sleep two times or less a month o Oral steroids no more than once a year o No hospitalizations  Rhinitis: Bloodwork was positive to cat, dog and tree pollen.  Continue Claritin 10mg  daily. May take twice a day if needed.   Continue Singulair (montelukast) 10mg  daily.  Continue Astelin nasal spray 2 sprays per nostril 1-2 time a day as needed for runny nose.  Continue Fluticasone 1-2 sprays per nostril daily for nasal congestion.   Nasal saline spray (i.e., Simply Saline) or nasal saline lavage (i.e., NeilMed) is recommended as needed and prior to medicated nasal sprays.  You may use both nasal sprays back to back.   May use olopatadine eye drop 1 drop in each eye twice daily as needed for itchy/watery eyes.   Once your asthma is more stable we can discuss starting allergy injections for the cat, dog and tree pollen.   Rash:  Continue proper skin care.   May use vanicream hydrocortisone  cream as needed.   Take pictures of the rash.  Follow up in 1 week to get breathing test and COVID-19 vaccine in our office.    Skin care recommendations  Bath time: . Always use lukewarm water. AVOID very hot or cold water. Marland Kitchen Keep bathing time to 5-10 minutes. . Do NOT use bubble bath. . Use a mild soap and use just enough to wash the dirty areas. . Do NOT scrub skin vigorously.  . After bathing, pat dry your skin with a towel. Do NOT rub or scrub the skin.  Moisturizers and prescriptions:  . ALWAYS apply moisturizers immediately after bathing (within 3 minutes). This helps to lock-in moisture. . Use the moisturizer several times a day over the whole body. Kermit Balo summer moisturizers include: Aveeno, CeraVe, Cetaphil. Kermit Balo winter moisturizers include: Aquaphor, Vaseline, Cerave, Cetaphil, Eucerin, Vanicream. . When using moisturizers along with medications, the moisturizer should be applied about one hour after applying the medication to prevent diluting effect of the medication or moisturize around where you applied the medications. When not using medications, the moisturizer can be continued twice daily as maintenance.  Laundry and clothing: . Avoid laundry products with added color or perfumes. . Use unscented hypo-allergenic laundry products such as Tide free, Cheer free & gentle, and All free and clear.  . If the skin still seems dry or sensitive, you can try double-rinsing the clothes. . Avoid tight or scratchy clothing such as wool. . Do not use fabric softeners or dyer  sheets.  Pet Allergen Avoidance: . Contrary to popular opinion, there are no "hypoallergenic" breeds of dogs or cats. That is because people are not allergic to an animal's hair, but to an allergen found in the animal's saliva, dander (dead skin flakes) or urine. Pet allergy symptoms typically occur within minutes. For some people, symptoms can build up and become most severe 8 to 12 hours after contact with  the animal. People with severe allergies can experience reactions in public places if dander has been transported on the pet owners' clothing. Marland Kitchen Keeping an animal outdoors is only a partial solution, since homes with pets in the yard still have higher concentrations of animal allergens. . Before getting a pet, ask your allergist to determine if you are allergic to animals. If your pet is already considered part of your family, try to minimize contact and keep the pet out of the bedroom and other rooms where you spend a great deal of time. . As with dust mites, vacuum carpets often or replace carpet with a hardwood floor, tile or linoleum. . High-efficiency particulate air (HEPA) cleaners can reduce allergen levels over time. . While dander and saliva are the source of cat and dog allergens, urine is the source of allergens from rabbits, hamsters, mice and [place] pigs; so ask a non-allergic family member to clean the animal's cage. . If you have a pet allergy, talk to your allergist about the potential for allergy immunotherapy (allergy shots). This strategy can often provide long-term relief.  Reducing Pollen Exposure . Pollen seasons: trees (spring), grass (summer) and ragweed/weeds (fall). Marland Kitchen Keep windows closed in your home and car to lower pollen exposure.  Susa Simmonds air conditioning in the bedroom and throughout the house if possible.  . Avoid going out in dry windy days - especially early morning. . Pollen counts are highest between 5 - 10 AM and on dry, hot and windy days.  . Save outside activities for late afternoon or after a heavy rain, when pollen levels are lower.  . Avoid mowing of grass if you have grass pollen allergy. Marland Kitchen Be aware that pollen can also be transported indoors on people and pets.  . Dry your clothes in an automatic dryer rather than hanging them outside where they might collect pollen.  . Rinse hair and eyes before bedtime.

## 2020-07-13 NOTE — Assessment & Plan Note (Signed)
Improved with Vanicream hydrocortisone cream. Other OTC hydrocortisone does not work.  Take pictures of the rash.  Continue proper skin care.   May use Vanicream hydrocortisone cream as needed.  May need to send in Rx for hydrocortisone as she can't find the above one over the counter.

## 2020-07-13 NOTE — Assessment & Plan Note (Signed)
Past history - Perennial rhino conjunctivitis symptoms for 30+ years with worsening in the summer. Tried Singulair, zyrtec, Astelin, Patanol with minimal improvement in symptoms. Bloodwork was positive to cat, dog and tree pollen. Interim history - Interested in allergy injections as she has 1 dog at home now. Below regimen does not control symptoms all the time.  Continue Claritin 10mg  daily. May take twice a day if needed.   Continue Singulair (montelukast) 10mg  daily.  Continue Astelin nasal spray 2 sprays per nostril 1-2 time a day as needed for runny nose.  Continue Fluticasone 1-2 sprays per nostril daily for nasal congestion.   Nasal saline spray (i.e., Simply Saline) or nasal saline lavage (i.e., NeilMed) is recommended as needed and prior to medicated nasal sprays.  You may use both nasal sprays back to back.   May use olopatadine eye drop 1 drop in each eye twice daily as needed for itchy/watery eyes.   Once your asthma is more stable we can discuss starting allergy injections for the cat, dog and tree pollen.

## 2020-07-14 ENCOUNTER — Telehealth: Payer: Self-pay | Admitting: *Deleted

## 2020-07-14 ENCOUNTER — Ambulatory Visit: Payer: Medicare HMO

## 2020-07-14 NOTE — Telephone Encounter (Signed)
Called [facility] ref patient Nucala autoinjector.  They have Rx and had not sent to their pharmacy [facility] that handles autoinjectors but will send today.  Tried to reach patient to give number to contact pharmacy next week if she does not hear from them [phone] but her voicemail is full and unable to leave message

## 2020-07-14 NOTE — Telephone Encounter (Signed)
Patient called back and was given number to contact for delivery

## 2020-07-19 ENCOUNTER — Other Ambulatory Visit: Payer: Self-pay | Admitting: Family Medicine

## 2020-07-19 ENCOUNTER — Other Ambulatory Visit: Payer: Self-pay

## 2020-07-20 ENCOUNTER — Other Ambulatory Visit: Payer: Self-pay | Admitting: Family Medicine

## 2020-07-21 ENCOUNTER — Ambulatory Visit: Payer: Medicare HMO

## 2020-07-21 ENCOUNTER — Encounter: Payer: Self-pay | Admitting: Family Medicine

## 2020-07-21 ENCOUNTER — Telehealth: Payer: Self-pay | Admitting: *Deleted

## 2020-07-21 ENCOUNTER — Telehealth: Payer: Self-pay

## 2020-07-21 ENCOUNTER — Other Ambulatory Visit: Payer: Self-pay

## 2020-07-21 ENCOUNTER — Other Ambulatory Visit: Payer: Self-pay | Admitting: Family Medicine

## 2020-07-21 ENCOUNTER — Ambulatory Visit (INDEPENDENT_AMBULATORY_CARE_PROVIDER_SITE_OTHER): Payer: Medicare HMO

## 2020-07-21 DIAGNOSIS — Z23 Encounter for immunization: Secondary | ICD-10-CM | POA: Diagnosis not present

## 2020-07-21 DIAGNOSIS — J4550 Severe persistent asthma, uncomplicated: Secondary | ICD-10-CM

## 2020-07-21 MED ORDER — ALPRAZOLAM 0.5 MG PO TABS: 0.5000 mg | ORAL_TABLET | Freq: Every day | ORAL | 1 refills | Status: DC

## 2020-07-21 NOTE — Progress Notes (Signed)
   Covid-19 Vaccination Clinic  Name:  DEZIYA AMERO    MRN: [id number] DOB: Oct 13, 1971  07/21/2020  Ms. Ashton was observed post Covid-19 immunization for 30 minutes based on pre-vaccination screening without incident. She was provided with Vaccine Information Sheet and instruction to access the V-Safe system.   [name] was instructed to call 911 with any severe reactions post vaccine: Marland Kitchen Difficulty breathing  . Swelling of face and throat  . A fast heartbeat  . A bad rash all over body  . Dizziness and weakness   Immunizations Administered    Name Date Dose VIS Date Route   Moderna COVID-19 Vaccine 07/21/2020 10:30 AM 0.5 mL 10/2019 Intramuscular   Manufacturer: Moderna   Lot: [id number]   [place]: [id number]

## 2020-07-21 NOTE — Telephone Encounter (Signed)
I have already prescribed it and I sent her a message

## 2020-07-21 NOTE — Progress Notes (Signed)
Any known reactions to polyethylene glycol or polysorbate?  No.  Any history of anaphylaxis to vaccinations: No. Patient just got flu shot with no issues this year.   Any history of reactions to injectable medications? No prior injectable medications.   Any history of anaphylaxis to colonoscopy preps (i.e.Miralax)? No.   Any history of dermal filler treatments in the last year? No.  Patient received Moderna 0.5cc vaccine with 30 minute wait with no issues.  Needs second dose in 4 weeks.   She is having issues with her Nucala paperwork.  Spirometry:  Tracings reviewed. Her effort: It was hard to get consistent efforts and there is a question as to whether this reflects a maximal maneuver. FVC: 1.67L FEV1: 1.49L, 64% predicted FEV1/FVC ratio: 89% Interpretation: Spirometry consistent with possible restrictive disease.  Please see scanned spirometry results for details.

## 2020-07-21 NOTE — Telephone Encounter (Signed)
Pt is calling saying nobody is refilling her xanax and needs it sent asap

## 2020-07-21 NOTE — Telephone Encounter (Signed)
I reached out to the pharmacy [facility] to confirm they have Nucala autoinjector order from [place] and they advised they had it ready and had reached out to patient with no response.  I called patient and gave her number to contact again for medication delivery

## 2020-07-21 NOTE — Telephone Encounter (Signed)
-----   Message from Garnet Sierras, DO sent at 07/21/2020 11:07 AM EDT ----- Can you check on her paperwork for Nucala? She said that she is due for Next Nucala injection but hasn't received the medication in the mail? Thank you.

## 2020-07-24 ENCOUNTER — Other Ambulatory Visit: Payer: Self-pay

## 2020-07-24 DIAGNOSIS — J4550 Severe persistent asthma, uncomplicated: Secondary | ICD-10-CM

## 2020-07-24 NOTE — Telephone Encounter (Signed)
Can you let pt know she sent her refill on 9/17. Thanks

## 2020-07-25 ENCOUNTER — Other Ambulatory Visit: Payer: Self-pay

## 2020-07-26 ENCOUNTER — Other Ambulatory Visit: Payer: Self-pay

## 2020-07-26 ENCOUNTER — Encounter: Payer: Medicare HMO | Attending: Physical Medicine and Rehabilitation | Admitting: Physical Medicine and Rehabilitation

## 2020-07-26 ENCOUNTER — Encounter: Payer: Self-pay | Admitting: Physical Medicine and Rehabilitation

## 2020-07-26 VITALS — BP 125/86 | HR 100 | Temp 98.6°F | Ht 66.0 in | Wt 185.6 lb

## 2020-07-26 DIAGNOSIS — M961 Postlaminectomy syndrome, not elsewhere classified: Secondary | ICD-10-CM | POA: Diagnosis not present

## 2020-07-26 DIAGNOSIS — M7918 Myalgia, other site: Secondary | ICD-10-CM | POA: Diagnosis not present

## 2020-07-26 DIAGNOSIS — Z79891 Long term (current) use of opiate analgesic: Secondary | ICD-10-CM

## 2020-07-26 DIAGNOSIS — M19041 Primary osteoarthritis, right hand: Secondary | ICD-10-CM | POA: Diagnosis not present

## 2020-07-26 DIAGNOSIS — M94261 Chondromalacia, right knee: Secondary | ICD-10-CM

## 2020-07-26 DIAGNOSIS — M5136 Other intervertebral disc degeneration, lumbar region: Secondary | ICD-10-CM | POA: Diagnosis not present

## 2020-07-26 DIAGNOSIS — M19042 Primary osteoarthritis, left hand: Secondary | ICD-10-CM | POA: Insufficient documentation

## 2020-07-26 DIAGNOSIS — M5416 Radiculopathy, lumbar region: Secondary | ICD-10-CM | POA: Insufficient documentation

## 2020-07-26 DIAGNOSIS — M94262 Chondromalacia, left knee: Secondary | ICD-10-CM

## 2020-07-26 DIAGNOSIS — Z5181 Encounter for therapeutic drug level monitoring: Secondary | ICD-10-CM | POA: Diagnosis not present

## 2020-07-26 DIAGNOSIS — G894 Chronic pain syndrome: Secondary | ICD-10-CM | POA: Diagnosis not present

## 2020-07-26 MED ORDER — MAGNESIUM 400 MG PO CAPS
1.0000 | ORAL_CAPSULE | Freq: Two times a day (BID) | ORAL | 10 refills | Status: DC
Start: 2020-07-26 — End: 2021-03-12

## 2020-07-26 MED ORDER — OXYCODONE HCL 5 MG PO TABS
10.0000 mg | ORAL_TABLET | Freq: Two times a day (BID) | ORAL | 0 refills | Status: DC
Start: 2020-07-26 — End: 2020-08-31

## 2020-07-26 MED ORDER — DULOXETINE HCL 20 MG PO CPEP: 20.0000 mg | ORAL_CAPSULE | Freq: Every day | ORAL | 3 refills | Status: DC

## 2020-07-26 NOTE — Progress Notes (Signed)
Subjective:    Patient ID: Jane English, female    DOB: 1970/11/26, 49 y.o.   MRN: [id number]  Mrs. Stierwalt presents for follow up of failed back syndrome, right hip pain, bilateral knee pain, and cervical pain.   Since last visit she fell down 17 steps while cleaning the stairs. She has been using the Oxycodne which helps as well as 5% lidocaine patches, using the compounding cream. She is having a hard time sleeping at night. She had a bruise across the back.   R buttock pain: Pain radiates posteriorly down her thigh- it does not extend further. It feels constant, sharp, burning, dull, stabbing, tingling, and aching. It is worse with with walking, bending, sitting, and standing. This pain continues today.   Failed back syndrome: She requires a refill of her oxycodone for her chronic pain related to this issue. She has been taking medication as prescribed.   Asthma: She received 100mg  Nucala for her severe persistent asthma. Immunotherapy note reviewed.   Muscle cramps: She has been taking magnesium 400mg  daly and would like to take it twice per day. I have sent script today  Bilateral knee pain: She had good benefit from viscosupplementation in the past and would like to repeat next visit.   Pain Inventory Average Pain 9 Pain Right Now 9 My pain is constant, sharp, burning, dull, stabbing, tingling and aching  In the last 24 hours, has pain interfered with the following? General activity 9 Relation with others 9 Enjoyment of life 9 What TIME of day is your pain at its worst? All the time. Sleep (in general) Fair  Pain is worse with: walking, standing and some activites Pain improves with: medication Relief from Meds: 8     Family History  Problem Relation Age of Onset  . Lung cancer Father   . Stomach cancer Father   . Esophageal cancer Father   . Alcohol abuse Father   . Mental illness Father   . Diabetes Sister   . Hypertension Sister   . Bipolar disorder Sister     . Schizophrenia Sister   . Diabetes Sister   . Alcohol abuse Brother   . Hypertension Brother   . Kidney disease Brother   . Diabetes Brother   . Drug abuse Brother   . Mental illness Brother   . Alcohol abuse Brother   . Alcohol abuse Brother   . Hypertension Brother   . Diabetes Brother   . Alcohol abuse Brother   . Physical abuse Mother   . Alcohol abuse Mother   . Cirrhosis Mother   . Mental illness Brother        in [place]  . Drug abuse Sister   . HIV Sister   . Alcohol abuse Brother   . Pneumonia Sister        died as a baby  . Alcohol abuse Brother   . Bipolar disorder Brother   . Bipolar disorder Daughter   . Bipolar disorder Son   . Bipolar disorder Son   . Hypertension Brother   . Bipolar disorder Brother   . Drug abuse Brother   . Alcohol abuse Brother   . Bipolar disorder Brother   . ADD / ADHD Neg Hx   . Anxiety disorder Neg Hx   . Dementia Neg Hx   . Depression Neg Hx   . OCD Neg Hx   . Seizures Neg Hx   . Paranoid behavior Neg Hx   . Colon  cancer Neg Hx    Social History   Socioeconomic History  . Marital status: Married    Spouse name: Not on file  . Number of children: 4  . Years of education: Not on file  . Highest education level: Not on file  Occupational History  . Not on file  Tobacco Use  . Smoking status: Never Smoker  . Smokeless tobacco: Never Used  Vaping Use  . Vaping Use: Never used  Substance and Sexual Activity  . Alcohol use: No  . Drug use: No  . Sexual activity: Yes    Partners: Male    Birth control/protection: Surgical    Comment: tubal  Other Topics Concern  . Not on file  Social History Narrative  . Not on file   Social Determinants of Health   Financial Resource Strain:   . Difficulty of Paying Living Expenses: Not on file  Food Insecurity:   . Worried About Charity fundraiser in the Last Year: Not on file  . Ran Out of Food in the Last Year: Not on file  Transportation Needs:   . Lack of  Transportation (Medical): Not on file  . Lack of Transportation (Non-Medical): Not on file  Physical Activity:   . Days of Exercise per Week: Not on file  . Minutes of Exercise per Session: Not on file  Stress:   . Feeling of Stress : Not on file  Social Connections:   . Frequency of Communication with Friends and Family: Not on file  . Frequency of Social Gatherings with Friends and Family: Not on file  . Attends Religious Services: Not on file  . Active Member of Clubs or Organizations: Not on file  . Attends Archivist Meetings: Not on file  . Marital Status: Not on file   Past Surgical History:  Procedure Laterality Date  . ANTERIOR CERVICAL DECOMP/DISCECTOMY FUSION  07/07/2012   Procedure: ANTERIOR CERVICAL DECOMPRESSION/DISCECTOMY FUSION 2 LEVELS;  Surgeon: Floyce Stakes, MD;  Location: [facility];  Service: Neurosurgery;  Laterality: N/A;  Cervical four-five, five - six  Anterior cervical decompression/diskectomy/fusion/plate  . APPENDECTOMY  1986  . BOWEL RESECTION N/A 07/29/2013   Procedure: serosal repair;  Surgeon: Adin Hector, MD;  Location: [facility];  Service: General;  Laterality: N/A;  . CARPAL TUNNEL RELEASE Bilateral   . LAPAROSCOPY N/A 07/29/2013   Procedure: diagnostic laporoscopy;  Surgeon: Adin Hector, MD;  Location: [facility];  Service: General;  Laterality: N/A;  . LAPAROSCOPY N/A 08/16/2013   Procedure: LAPAROSCOPY DIAGNOSTIC/LYSIS OF ADHESIONS;  Surgeon: Adin Hector, MD;  Location: [facility];  Service: General;  Laterality: N/A;  . LAPAROTOMY N/A 08/16/2013   Procedure: EXPLORATORY LAPAROTOMY/SMALL BOWEL RESECTION (JEJUNUM);  Surgeon: Adin Hector, MD;  Location: [facility];  Service: General;  Laterality: N/A;  . LUMBAR SPINE SURGERY  2010   x 3  . LYSIS OF ADHESION  2003   Dr. Irving Shows  . LYSIS OF ADHESION N/A 07/29/2013   Procedure: LYSIS OF ADHESION;  Surgeon: Adin Hector, MD;  Location: [facility];  Service: General;  Laterality: N/A;    . OOPHORECTOMY    . PARTIAL HYSTERECTOMY  1990s?   [place], [place]  . SPINAL CORD STIMULATOR IMPLANT    . TRIGGER FINGER RELEASE  2009   right pinkie finger  . TUBAL LIGATION  1994   Past Medical History:  Diagnosis Date  . Anemia   . Asthma   . Asthma  flare 04/09/2013  . Back pain   . Bronchitis   . Chronic abdominal pain   . Chronic constipation   . Constipation due to opioid therapy   . Depression   . Depression, major, single episode, severe ([place]) 10/03/2018   PHQ 9 score of 15  . Diabetes mellitus without complication ([place])   . Diabetes mellitus, type II ([place])   . DVT (deep venous thrombosis) ([place]) 2010  . GERD (gastroesophageal reflux disease)   . Heart murmur    no cardiologist  . Helicobacter pylori gastritis 06/11/2013   Colonoscopy Dr. Hilarie Fredrickson  . Hypertension   . IBS (irritable bowel syndrome)   . Migraine headache   . Neuropathy   . Obesity   . Obsessive-compulsive disorder   . PSYCHOTIC D/O W/HALLUCINATIONS CONDS CLASS ELSW 03/04/2010   Qualifier: Diagnosis of  By: Moshe Cipro MD, Joycelyn Schmid    . PTSD (post-traumatic stress disorder)   . SBO (small bowel obstruction) ([place]) 08/09/2013  . Seasonal allergies 12/10/2012  . Seizures ([place])   . Shortness of breath    BP 125/86   Pulse 100   Temp 98.6 F (37 C)   Ht 5\' 6"  (1.676 m)   Wt 185 lb 9.6 oz (84.2 kg)   SpO2 97%   BMI 29.96 kg/m   Opioid Risk Score:   Fall Risk Score:  `1  Depression screen PHQ 2/9  Depression screen [facility] 2/9 07/26/2020 05/12/2020 05/01/2020 04/17/2020 02/23/2020 02/18/2020 02/01/2020  Decreased Interest 1 0 0 0 0 0 0  Down, Depressed, Hopeless 1 0 0 0 0 0 0  PHQ - 2 Score 2 0 0 0 0 0 0  Altered sleeping - - 0 - - - -  Tired, decreased energy - - 0 - - - -  Change in appetite - - 0 - - - -  Feeling bad or failure about yourself  - - 0 - - - -  Trouble concentrating - - 0 - - - -  Moving slowly or fidgety/restless - - 0 - - - -  Suicidal thoughts - - 0 - - - -  PHQ-9 Score - - 0 - - - -   Difficult doing work/chores - - Not difficult at all - - - -  Some encounter information is confidential and restricted. Go to Review Flowsheets activity to see all data.  Some recent data might be hidden   Review of Systems  Constitutional: Negative.   HENT: Negative.   Eyes: Negative.   Respiratory: Positive for wheezing.   Cardiovascular: Negative.   Endocrine: Negative.   Genitourinary: Negative.   Musculoskeletal: Positive for gait problem.       Spasms  Allergic/Immunologic: Negative.   Neurological: Positive for weakness.       Tingling & numbness  Hematological: Negative.   Psychiatric/Behavioral: Negative.        Objective:   Physical Exam Gen: no distress, normal appearing HEENT: oral mucosa pink and moist, NCAT Cardio: Reg rate Chest: normal effort, normal rate of breathing Abd: soft, non-distended Ext: no edema Skin: intact Neuro: Alert and oriented x3.  5/5 left lower extremity strength.  4/5 right lower extremity strength- pain limited.  +Slump test on left for pain radiating in S1 nerve distribution.  Tenderness to palpation over right buttock.  Psych: pleasant, normal affect     Assessment & Plan:  Mrs. Lame is a 49 year old woman who follows for failed back syndrome, who presents today for exacerbation of right buttock  pain, as well as bilateral knee pain.   R buttock pain: -Radiates in S1 distribution, associated with weakness on right side, +Slump test. -She has spinal cord stimulator which was a barrier to getting the MRI.  Failed back syndrome: -Provided refill of Oxycodone 10mg  BID. She has been on this dose for many years and it has helped her cope with her pain and maintain her job. No negative side effects. UDS obtained today.  -Discussed [employer] PNS system as an option of pain treatment via neuromodulation. Provided following link for patient to learn more about the system: https://www.sprtherapeutics.com/.   Recent dental surgery: Did  not take any paid meds from dentist.   Bilateral knee OA; Repeat viscosupplementation injections for bilateral knees again next visit. She had great benefit from this.   Mood: Very positive. Very excited about her grandchildren.   Muscle cramps: Increase magnesium to 400mg  BID.   RTC in 1 month.

## 2020-07-26 NOTE — Patient Instructions (Signed)
  1) Ginger 2) Blueberries 3) Salmon 4) Pumpkin seeds 5) dark chocolate 6) turmeric 7) tart cherries 8) virgin olive oil 9) chilli peppers 10) mint 11) red wine 

## 2020-07-27 ENCOUNTER — Ambulatory Visit: Payer: Medicare HMO | Admitting: Nutrition

## 2020-08-01 ENCOUNTER — Encounter: Payer: Medicare HMO | Admitting: Family Medicine

## 2020-08-09 ENCOUNTER — Other Ambulatory Visit: Payer: Self-pay | Admitting: Family Medicine

## 2020-08-09 ENCOUNTER — Ambulatory Visit: Payer: Medicare HMO | Admitting: Family Medicine

## 2020-08-10 ENCOUNTER — Other Ambulatory Visit: Payer: Self-pay

## 2020-08-10 ENCOUNTER — Other Ambulatory Visit (HOSPITAL_COMMUNITY)
Admission: RE | Admit: 2020-08-10 | Discharge: 2020-08-10 | Disposition: A | Discharge status: Home / Self Care | Payer: Medicare HMO | Source: Ambulatory Visit | Attending: Family Medicine | Admitting: Family Medicine

## 2020-08-10 ENCOUNTER — Ambulatory Visit (INDEPENDENT_AMBULATORY_CARE_PROVIDER_SITE_OTHER): Payer: Medicare HMO | Admitting: Family Medicine

## 2020-08-10 VITALS — BP 120/84 | HR 76 | Ht 66.0 in | Wt 185.0 lb

## 2020-08-10 DIAGNOSIS — Z0000 Encounter for general adult medical examination without abnormal findings: Secondary | ICD-10-CM | POA: Insufficient documentation

## 2020-08-10 DIAGNOSIS — Z124 Encounter for screening for malignant neoplasm of cervix: Secondary | ICD-10-CM

## 2020-08-10 DIAGNOSIS — N760 Acute vaginitis: Secondary | ICD-10-CM | POA: Diagnosis not present

## 2020-08-10 MED ORDER — SOLIFENACIN SUCCINATE 5 MG PO TABS
5.0000 mg | ORAL_TABLET | Freq: Every day | ORAL | 1 refills | Status: DC
Start: 2020-08-10 — End: 2021-01-30

## 2020-08-10 NOTE — Assessment & Plan Note (Signed)

## 2020-08-10 NOTE — Patient Instructions (Addendum)
  F/u in office with MD early January, call if you need me before  Work with food choice and exercise for the next 3 months for weight management, hold off of phentermine  New other medication changes  Think about what you will eat, plan ahead. Choose " clean, green, fresh or frozen" over canned, processed or packaged foods which are more sugary, salty and fatty. 70 to 75% of food eaten should be vegetables and fruit. Three meals at set times with snacks allowed between meals, but they must be fruit or vegetables. Aim to eat over a 12 hour period , example 7 am to 7 pm, and STOP after  your last meal of the day. Drink water,generally about 64 ounces per day, no other drink is as healthy. Fruit juice is best enjoyed in a healthy way, by EATING the fruit. Thanks for choosing [facility], we consider it a privelige to serve you.

## 2020-08-11 ENCOUNTER — Encounter: Payer: Self-pay | Admitting: Family Medicine

## 2020-08-11 DIAGNOSIS — Z124 Encounter for screening for malignant neoplasm of cervix: Secondary | ICD-10-CM | POA: Insufficient documentation

## 2020-08-11 MED ORDER — ALPRAZOLAM 0.5 MG PO TABS: ORAL_TABLET | ORAL | 3 refills | Status: DC

## 2020-08-11 NOTE — Progress Notes (Signed)
    Jane English     MRN: [id number]      DOB: 07-02-1971  HPI: Patient is in for annual physical exam. No other health concerns are expressed or addressed at the visit. Recent labs, if available are reviewed. Immunization is reviewed , and  updated if needed.   PE: Pleasant  female, alert and oriented x 3, in no cardio-pulmonary distress. Afebrile. HEENT No facial trauma or asymetry. Sinuses non tender.  Extra occullar muscles intact.. External ears normal, . Neck: supple, no adenopathy,JVD or thyromegaly.No bruits.  Chest: Clear to ascultation bilaterally.No crackles or wheezes. Non tender to palpation  Breast: No asymetry,no masses or lumps. No tenderness. No nipple discharge or inversion. No axillary or supraclavicular adenopathy  Cardiovascular system; Heart sounds normal,  S1 and  S2 ,no S3.  No murmur, or thrill. Apical beat not displaced Peripheral pulses normal.  Abdomen: Soft, non tender, no organomegaly or masses. No bruits. Bowel sounds normal. No guarding, tenderness or rebound.   GU: External genitalia normal female genitalia , normal female distribution of hair. No lesions. Urethral meatus normal in size, no  Prolapse, no lesions visibly  Present. Bladder non tender. Vagina pink and moist , with no visible lesions , discharge present . Adequate pelvic support no  cystocele or rectocele noted Cervix pink and appears healthy, no lesions or ulcerations noted, no discharge noted from os Uterus normal size, no adnexal masses, no cervical motion or adnexal tenderness.   Musculoskeletal exam: Full ROM of spine, hips , shoulders and knees. No deformity ,swelling or crepitus noted. No muscle wasting or atrophy.   Neurologic: Cranial nerves 2 to 12 intact. Power, tone ,sensation and reflexes normal throughout. No disturbance in gait. No tremor.  Skin: Intact, no ulceration, erythema , scaling or rash noted. Pigmentation normal  throughout  Psych; Normal mood and affect. Judgement and concentration normal   Assessment & Plan:  Annual physical exam Annual exam as documented. Counseling done  re healthy lifestyle involving commitment to 150 minutes exercise per week, heart healthy diet, and attaining healthy weight.The importance of adequate sleep also discussed. Regular seat belt use and home safety, is also discussed. Changes in health habits are decided on by the patient with goals and time frames  set for achieving them. Immunization and cancer screening needs are specifically addressed at this visit.

## 2020-08-14 ENCOUNTER — Other Ambulatory Visit: Payer: Self-pay

## 2020-08-14 ENCOUNTER — Ambulatory Visit (INDEPENDENT_AMBULATORY_CARE_PROVIDER_SITE_OTHER): Payer: Medicare HMO

## 2020-08-14 DIAGNOSIS — Z23 Encounter for immunization: Secondary | ICD-10-CM

## 2020-08-14 NOTE — Progress Notes (Signed)
   Covid-19 Vaccination Clinic  Name:  Jane English    MRN: [id number] DOB: 10-08-1971  08/14/2020  [name] was observed post Covid-19 immunization for 15 minutes without incident. She was provided with Vaccine Information Sheet and instruction to access the V-Safe system.   [name] was instructed to call 911 with any severe reactions post vaccine: Marland Kitchen Difficulty breathing  . Swelling of face and throat  . A fast heartbeat  . A bad rash all over body  . Dizziness and weakness   Immunizations Administered    Name Date Dose VIS Date Route   Moderna COVID-19 Vaccine 08/14/2020 10:04 AM 0.5 mL 10/2019 Intramuscular   Manufacturer: Moderna   Lot: [id number]   [place]: 55001-642-90

## 2020-08-15 LAB — CYTOLOGY - PAP
Chlamydia: NEGATIVE
Comment: NEGATIVE
Comment: NEGATIVE
Comment: NEGATIVE
Comment: NORMAL
Diagnosis: NEGATIVE
HSV1: NEGATIVE
HSV2: NEGATIVE
High risk HPV: NEGATIVE
Neisseria Gonorrhea: NEGATIVE

## 2020-08-18 ENCOUNTER — Other Ambulatory Visit: Payer: Self-pay | Admitting: Family Medicine

## 2020-08-31 ENCOUNTER — Encounter: Payer: Medicare HMO | Attending: Physical Medicine and Rehabilitation | Admitting: Physical Medicine and Rehabilitation

## 2020-08-31 ENCOUNTER — Encounter: Payer: Self-pay | Admitting: Physical Medicine and Rehabilitation

## 2020-08-31 ENCOUNTER — Other Ambulatory Visit: Payer: Self-pay

## 2020-08-31 VITALS — BP 116/71 | HR 85 | Temp 98.2°F | Ht 66.0 in | Wt 183.6 lb

## 2020-08-31 DIAGNOSIS — G894 Chronic pain syndrome: Secondary | ICD-10-CM

## 2020-08-31 DIAGNOSIS — Z79891 Long term (current) use of opiate analgesic: Secondary | ICD-10-CM

## 2020-08-31 DIAGNOSIS — Z5181 Encounter for therapeutic drug level monitoring: Secondary | ICD-10-CM | POA: Insufficient documentation

## 2020-08-31 MED ORDER — OXYCODONE HCL 5 MG PO TABS
10.0000 mg | ORAL_TABLET | Freq: Two times a day (BID) | ORAL | 0 refills | Status: DC
Start: 2020-08-31 — End: 2020-10-05

## 2020-09-01 NOTE — Progress Notes (Signed)
Knee injection, bilateral  Indication: Bilateral Knee pain not relieved by medication management and other conservative care.  Informed consent was obtained after describing risks and benefits of the procedure with the patient, this includes bleeding, bruising, infection and medication side effects. The patient wishes to proceed and has given written consent. The patient was placed in a recumbent position. The medial aspect of the knees were marked and prepped with Betadine and alcohol. They were then entered with a 25-gauge 1-1/2 inch needle and Monovisc was injected into the knee joint, after negative draw back for blood. The patient tolerated the procedure well. Post procedure instructions were given.

## 2020-09-05 LAB — TOXASSURE SELECT,+ANTIDEPR,UR

## 2020-09-14 ENCOUNTER — Other Ambulatory Visit: Payer: Self-pay

## 2020-09-14 ENCOUNTER — Encounter: Payer: Self-pay | Admitting: Family Medicine

## 2020-09-14 ENCOUNTER — Telehealth: Payer: Self-pay | Admitting: *Deleted

## 2020-09-14 DIAGNOSIS — M79672 Pain in left foot: Secondary | ICD-10-CM

## 2020-09-14 NOTE — Telephone Encounter (Signed)
Urine drug screen for this encounter is consistent for prescribed medication 

## 2020-09-15 ENCOUNTER — Other Ambulatory Visit: Payer: Self-pay

## 2020-09-15 ENCOUNTER — Encounter: Payer: Self-pay | Admitting: Podiatry

## 2020-09-15 ENCOUNTER — Ambulatory Visit: Payer: Medicare HMO | Admitting: Podiatry

## 2020-09-15 ENCOUNTER — Ambulatory Visit (INDEPENDENT_AMBULATORY_CARE_PROVIDER_SITE_OTHER): Payer: Medicare HMO

## 2020-09-15 DIAGNOSIS — M79672 Pain in left foot: Secondary | ICD-10-CM | POA: Diagnosis not present

## 2020-09-15 DIAGNOSIS — M84372A Stress fracture, left ankle, initial encounter for fracture: Secondary | ICD-10-CM

## 2020-09-15 MED ORDER — DICLOFENAC SODIUM 75 MG PO TBEC: 75.0000 mg | DELAYED_RELEASE_TABLET | Freq: Two times a day (BID) | ORAL | 2 refills | Status: DC

## 2020-09-16 NOTE — Progress Notes (Signed)
Subjective:   Patient ID: Jane English, female   DOB: 49 y.o.   MRN: [id number]   HPI Patient presents stating that she is developed severe pain in the bottom of her left heel for 3 days and she cannot bear any weight on her heel.  That she knows she does not remember any specific injury and states the pain is intense and she is in a wheelchair currently   ROS      Objective:  Physical Exam  Neurovascular status intact with intense discomfort within the bulk of the left heel that is affecting the calcaneus itself and while the plantar heel is also hurting the pain seems to be more within the actual heel bone itself     Assessment:  Stress fracture very strong possibility of the left calcaneus due to the intensity of discomfort and where her pain is added versus plantar fasciitis     Plan:  H&P reviewed condition.  At this point I have recommended cam walker to completely immobilize the heel and lower leg and take all pressure off it along with ice therapy and I placed on diclofenac to try to help with an inflammation and she does have pain medicine at home.  Patient will be seen back to recheck 3 weeks and let us know if anything else were to occur and did have negative Bevelyn Buckles' sign  X-ray indicates that there is no indications this this appears to be a fracture but it is too early to make complete judgment about stress fracture currently and she does have spur formation

## 2020-09-19 ENCOUNTER — Other Ambulatory Visit: Payer: Self-pay | Admitting: Podiatry

## 2020-09-19 DIAGNOSIS — M84372A Stress fracture, left ankle, initial encounter for fracture: Secondary | ICD-10-CM

## 2020-09-26 ENCOUNTER — Other Ambulatory Visit: Payer: Self-pay | Admitting: Family Medicine

## 2020-10-05 ENCOUNTER — Encounter: Payer: Self-pay | Admitting: Physical Medicine and Rehabilitation

## 2020-10-05 ENCOUNTER — Encounter: Payer: Medicare HMO | Attending: Physical Medicine and Rehabilitation | Admitting: Physical Medicine and Rehabilitation

## 2020-10-05 ENCOUNTER — Other Ambulatory Visit: Payer: Self-pay | Admitting: Family Medicine

## 2020-10-05 ENCOUNTER — Other Ambulatory Visit: Payer: Self-pay

## 2020-10-05 VITALS — BP 124/76 | HR 95 | Temp 98.5°F | Ht 66.0 in | Wt 183.0 lb

## 2020-10-05 DIAGNOSIS — M94261 Chondromalacia, right knee: Secondary | ICD-10-CM

## 2020-10-05 DIAGNOSIS — M7918 Myalgia, other site: Secondary | ICD-10-CM | POA: Diagnosis not present

## 2020-10-05 DIAGNOSIS — M5010 Cervical disc disorder with radiculopathy, unspecified cervical region: Secondary | ICD-10-CM | POA: Diagnosis not present

## 2020-10-05 DIAGNOSIS — M961 Postlaminectomy syndrome, not elsewhere classified: Secondary | ICD-10-CM | POA: Diagnosis not present

## 2020-10-05 DIAGNOSIS — M94262 Chondromalacia, left knee: Secondary | ICD-10-CM | POA: Diagnosis not present

## 2020-10-05 DIAGNOSIS — M5416 Radiculopathy, lumbar region: Secondary | ICD-10-CM | POA: Diagnosis not present

## 2020-10-05 MED ORDER — OXYCODONE HCL 5 MG PO TABS: 10.0000 mg | ORAL_TABLET | Freq: Two times a day (BID) | ORAL | 0 refills | Status: DC

## 2020-10-05 NOTE — Progress Notes (Signed)
Subjective:    Patient ID: Jane English, female    DOB: 07/16/1971, 49 y.o.   MRN: [id number]  Mrs. Shiveley presents for follow up of failed back syndrome, right hip pain, bilateral knee pain, and cervical pain.  Her knees have been doing better after viscosupplementation.   Since last visit she has developed plantar fasciitis. She is wearing a boot on her left foot and is starting to develop plantar fascitis in her right foot as well. She continues to walk despite this. She has a XR scheduled tomorrow. She has XRs tomorrow.   She continues to have right buttock pain and she has been dealing with it. She has been doing swats, transitioning from hot and cold.   No more falls.   Prior history:  Since last visit she fell down 17 steps while cleaning the stairs. She has been using the Oxycodne which helps as well as 5% lidocaine patches, using the compounding cream. She is having a hard time sleeping at night. She had a bruise across the back.   R buttock pain: Pain radiates posteriorly down her thigh- it does not extend further. It feels constant, sharp, burning, dull, stabbing, tingling, and aching. It is worse with with walking, bending, sitting, and standing. This pain continues today.   Failed back syndrome: She requires a refill of her oxycodone for her chronic pain related to this issue. She has been taking medication as prescribed.   Asthma: She received 100mg  Nucala for her severe persistent asthma. Immunotherapy note reviewed.   Muscle cramps: She has been taking magnesium 400mg  daly and would like to take it twice per day. I have sent script today  Bilateral knee pain: She had good benefit from viscosupplementation in the past and would like to repeat next visit.   Pain Inventory Average Pain 9 Pain Right Now 8 My pain is constant, sharp, burning, dull, stabbing, tingling and aching  In the last 24 hours, has pain interfered with the following? General activity 8 Relation  with others 8 Enjoyment of life 8 What TIME of day is your pain at its worst? All the time. Sleep (in general) Fair  Pain is worse with: walking, sitting and standing Pain improves with: medication Relief from Meds: 9     Family History  Problem Relation Age of Onset  . Lung cancer Father   . Stomach cancer Father   . Esophageal cancer Father   . Alcohol abuse Father   . Mental illness Father   . Diabetes Sister   . Hypertension Sister   . Bipolar disorder Sister   . Schizophrenia Sister   . Diabetes Sister   . Alcohol abuse Brother   . Hypertension Brother   . Kidney disease Brother   . Diabetes Brother   . Drug abuse Brother   . Mental illness Brother   . Alcohol abuse Brother   . Alcohol abuse Brother   . Hypertension Brother   . Diabetes Brother   . Alcohol abuse Brother   . Physical abuse Mother   . Alcohol abuse Mother   . Cirrhosis Mother   . Mental illness Brother        in [place]  . Drug abuse Sister   . HIV Sister   . Alcohol abuse Brother   . Pneumonia Sister        died as a baby  . Alcohol abuse Brother   . Bipolar disorder Brother   . Bipolar disorder Daughter   .  Bipolar disorder Son   . Bipolar disorder Son   . Hypertension Brother   . Bipolar disorder Brother   . Drug abuse Brother   . Alcohol abuse Brother   . Bipolar disorder Brother   . ADD / ADHD Neg Hx   . Anxiety disorder Neg Hx   . Dementia Neg Hx   . Depression Neg Hx   . OCD Neg Hx   . Seizures Neg Hx   . Paranoid behavior Neg Hx   . Colon cancer Neg Hx    Social History   Socioeconomic History  . Marital status: Married    Spouse name: Not on file  . Number of children: 4  . Years of education: Not on file  . Highest education level: Not on file  Occupational History  . Not on file  Tobacco Use  . Smoking status: Never Smoker  . Smokeless tobacco: Never Used  Vaping Use  . Vaping Use: Never used  Substance and Sexual Activity  . Alcohol use: No  . Drug use: No   . Sexual activity: Yes    Partners: Male    Birth control/protection: Surgical    Comment: tubal  Other Topics Concern  . Not on file  Social History Narrative  . Not on file   Social Determinants of Health   Financial Resource Strain:   . Difficulty of Paying Living Expenses: Not on file  Food Insecurity:   . Worried About Charity fundraiser in the Last Year: Not on file  . Ran Out of Food in the Last Year: Not on file  Transportation Needs:   . Lack of Transportation (Medical): Not on file  . Lack of Transportation (Non-Medical): Not on file  Physical Activity:   . Days of Exercise per Week: Not on file  . Minutes of Exercise per Session: Not on file  Stress:   . Feeling of Stress : Not on file  Social Connections:   . Frequency of Communication with Friends and Family: Not on file  . Frequency of Social Gatherings with Friends and Family: Not on file  . Attends Religious Services: Not on file  . Active Member of Clubs or Organizations: Not on file  . Attends Archivist Meetings: Not on file  . Marital Status: Not on file   Past Surgical History:  Procedure Laterality Date  . ANTERIOR CERVICAL DECOMP/DISCECTOMY FUSION  07/07/2012   Procedure: ANTERIOR CERVICAL DECOMPRESSION/DISCECTOMY FUSION 2 LEVELS;  Surgeon: Floyce Stakes, MD;  Location: [facility];  Service: Neurosurgery;  Laterality: N/A;  Cervical four-five, five - six  Anterior cervical decompression/diskectomy/fusion/plate  . APPENDECTOMY  1986  . BOWEL RESECTION N/A 07/29/2013   Procedure: serosal repair;  Surgeon: Adin Hector, MD;  Location: [facility];  Service: General;  Laterality: N/A;  . CARPAL TUNNEL RELEASE Bilateral   . COLON SURGERY N/A    [employer]rees[employer] 07/29/2020  . LAPAROSCOPY N/A 07/29/2013   Procedure: diagnostic laporoscopy;  Surgeon: Adin Hector, MD;  Location: [facility];  Service: General;  Laterality: N/A;  . LAPAROSCOPY N/A 08/16/2013   Procedure: LAPAROSCOPY DIAGNOSTIC/LYSIS OF  ADHESIONS;  Surgeon: Adin Hector, MD;  Location: [facility];  Service: General;  Laterality: N/A;  . LAPAROTOMY N/A 08/16/2013   Procedure: EXPLORATORY LAPAROTOMY/SMALL BOWEL RESECTION (JEJUNUM);  Surgeon: Adin Hector, MD;  Location: [facility];  Service: General;  Laterality: N/A;  . LUMBAR SPINE SURGERY  2010   x 3  . LYSIS OF  ADHESION  2003   Dr. Irving Shows  . LYSIS OF ADHESION N/A 07/29/2013   Procedure: LYSIS OF ADHESION;  Surgeon: Adin Hector, MD;  Location: [facility];  Service: General;  Laterality: N/A;  . OOPHORECTOMY    . PARTIAL HYSTERECTOMY  1990s?   [place], [place]  . SPINAL CORD STIMULATOR IMPLANT    . SPINE SURGERY N/A    [employer]reesia 07/29/2020  . TRIGGER FINGER RELEASE  2009   right pinkie finger  . TUBAL LIGATION  1994   Past Medical History:  Diagnosis Date  . Anemia   . Asthma   . Asthma flare 04/09/2013  . Back pain   . Bronchitis   . Chronic abdominal pain   . Chronic constipation   . Constipation due to opioid therapy   . Depression   . Depression, major, single episode, severe ([place]) 10/03/2018   PHQ 9 score of 15  . Diabetes mellitus without complication ([place])   . Diabetes mellitus, type II ([place])   . DVT (deep venous thrombosis) ([place]) 2010  . GERD (gastroesophageal reflux disease)   . Heart murmur    no cardiologist  . Helicobacter pylori gastritis 06/11/2013   Colonoscopy Dr. Hilarie Fredrickson  . Hypertension   . IBS (irritable bowel syndrome)   . Migraine headache   . Neuropathy   . Obesity   . Obsessive-compulsive disorder   . PSYCHOTIC D/O W/HALLUCINATIONS CONDS CLASS ELSW 03/04/2010   Qualifier: Diagnosis of  By: Moshe Cipro MD, Joycelyn Schmid    . PTSD (post-traumatic stress disorder)   . SBO (small bowel obstruction) ([place]) 08/09/2013  . Seasonal allergies 12/10/2012  . Seizures ([place])   . Shortness of breath    There were no vitals taken for this visit.  Opioid Risk Score:   Fall Risk Score:  `1  Depression screen PHQ 2/9  Depression screen [facility] 2/9 08/31/2020  08/10/2020 08/10/2020 07/26/2020 05/12/2020 05/01/2020 04/17/2020  Decreased Interest 0 0 0 1 0 0 0  Down, Depressed, Hopeless 0 0 0 1 0 0 0  PHQ - 2 Score 0 0 0 2 0 0 0  Altered sleeping - - - - - 0 -  Tired, decreased energy - - - - - 0 -  Change in appetite - - - - - 0 -  Feeling bad or failure about yourself  - - - - - 0 -  Trouble concentrating - - - - - 0 -  Moving slowly or fidgety/restless - - - - - 0 -  Suicidal thoughts - - - - - 0 -  PHQ-9 Score - - - - - 0 -  Difficult doing work/chores - - - - - Not difficult at all -  Some encounter information is confidential and restricted. Go to Review Flowsheets activity to see all data.  Some recent data might be hidden   Review of Systems  Constitutional: Negative.   HENT: Negative.   Eyes: Negative.   Respiratory: Positive for wheezing.   Cardiovascular: Negative.   Endocrine: Negative.   Genitourinary: Negative.   Musculoskeletal: Positive for gait problem.       Spasms  Allergic/Immunologic: Negative.   Neurological: Positive for weakness.       Tingling & numbness  Hematological: Negative.   Psychiatric/Behavioral: Negative.        Objective:   Physical Exam Gen: no distress, normal appearing HEENT: oral mucosa pink and moist, NCAT Cardio: Reg rate Chest: normal effort, normal rate of breathing Abd: soft, non-distended Ext: no  edema Skin: intact Neuro: Alert and oriented x3.  5/5 left lower extremity strength.  4/5 right lower extremity strength- pain limited.  +Slump test on left for pain radiating in S1 nerve distribution.  Tenderness to palpation over right buttock.  No tenderness to palpation over her knees.  Psych: pleasant, normal affect     Assessment & Plan:  Mrs. Neto is a 49 year old woman who follows for failed back syndrome, who presents today for exacerbation of right buttock pain, as well as bilateral knee pain.   R buttock pain: -Radiates in S1 distribution, associated with weakness on right  side, +Slump test. -She has spinal cord stimulator which was a barrier to getting the MRI. -She has been able to manage this pain well.   Failed back syndrome: -Provided refill of Oxycodone 10mg  BID. She has been on this dose for many years and it has helped her cope with her pain and maintain her job. Continued to have no negative side effects. UDS obtained routinely and has expected metabolites. -Discussed [employer] system as an option of pain treatment via neuromodulation. Provided following link for patient to learn more about the system: https://www.[employer].[email or web address]   Recent dental surgery: Did not take any paid meds from dentist.   Bilateral knee OA; Repeat viscosupplementation injections for bilateral knees were helpful- her pain has improved. Last injections done in October.   Mood: Very positive. Very excited about her grandchildren.   Muscle cramps: Increase magnesium to 400mg  BID.   Plantar fasciitis: Bilateral, provided with home exercises, educated regarding typical treatments. Recommended rolling plantar aspect of feet on tennis balls.   Quality of life: She is enjoying work. Had a good Thanksgiving and plans to celebrate Christmas with her son in [place].   RTC in 1 month.

## 2020-10-06 ENCOUNTER — Ambulatory Visit (INDEPENDENT_AMBULATORY_CARE_PROVIDER_SITE_OTHER): Payer: Medicare HMO

## 2020-10-06 ENCOUNTER — Encounter: Payer: Self-pay | Admitting: Podiatry

## 2020-10-06 ENCOUNTER — Ambulatory Visit: Payer: Medicare HMO

## 2020-10-06 ENCOUNTER — Ambulatory Visit (INDEPENDENT_AMBULATORY_CARE_PROVIDER_SITE_OTHER): Payer: Medicare HMO | Admitting: Podiatry

## 2020-10-06 DIAGNOSIS — M79672 Pain in left foot: Secondary | ICD-10-CM | POA: Diagnosis not present

## 2020-10-06 DIAGNOSIS — M84372A Stress fracture, left ankle, initial encounter for fracture: Secondary | ICD-10-CM | POA: Diagnosis not present

## 2020-10-06 DIAGNOSIS — M722 Plantar fascial fibromatosis: Secondary | ICD-10-CM | POA: Diagnosis not present

## 2020-10-08 NOTE — Progress Notes (Signed)
Subjective:   Patient ID: Jane English, female   DOB: 49 y.o.   MRN: [id number]   HPI Patient presents stating she still having a lot of pain in her left heel and states the boot helps some but still hurts that she is not wearing it   ROS      Objective:  Physical Exam  Neurovascular status intact with patient found to have exquisite discomfort which is still within the bulk of the calcaneus and moderately also in the plantar surface     Assessment:  Probability that this is a stress fracture of the left calcaneus with plantar fasciitis also still possible     Plan:  H&P reviewed condition and recommended continued boot usage ice therapy and anti-inflammatories. Patient will be seen back to recheck and rex-ray today  X-rays indicate possibility of a stress fracture left calcaneus hard to make complete determination at this point

## 2020-10-30 ENCOUNTER — Telehealth: Payer: Self-pay | Admitting: Internal Medicine

## 2020-10-30 NOTE — Telephone Encounter (Signed)
Pt states that she has been having stomach pain for the past two months. Sxs have gotten worse despite taking  Amitiza and following instructions that Dr.Pyrtle suggested. Pt is requesting to see him soon. She is refusing to see APP. Pls call her.

## 2020-10-30 NOTE — Telephone Encounter (Signed)
Pt scheduled to see Dr. Rhea Belton 11/20/20@2 :30pm. Pt aware of appt.

## 2020-10-30 NOTE — Telephone Encounter (Signed)
Please send to Dr Lauro Franklin nurse thank you

## 2020-10-31 ENCOUNTER — Other Ambulatory Visit: Payer: Self-pay

## 2020-10-31 ENCOUNTER — Encounter: Payer: Medicare HMO | Admitting: Family Medicine

## 2020-11-01 ENCOUNTER — Ambulatory Visit (INDEPENDENT_AMBULATORY_CARE_PROVIDER_SITE_OTHER): Payer: Medicare HMO | Admitting: Nurse Practitioner

## 2020-11-01 ENCOUNTER — Encounter: Payer: Self-pay | Admitting: Nurse Practitioner

## 2020-11-01 ENCOUNTER — Other Ambulatory Visit: Payer: Self-pay

## 2020-11-01 DIAGNOSIS — Z0000 Encounter for general adult medical examination without abnormal findings: Secondary | ICD-10-CM

## 2020-11-01 NOTE — Progress Notes (Signed)
Subjective:   Jane English is a 49 y.o. female who presents for Medicare Annual (Subsequent) preventive examination.        Objective:    There were no vitals filed for this visit. There is no height or weight on file to calculate BMI.  Advanced Directives 01/29/2020 11/15/2019 02/04/2019 08/13/2017 03/05/2017 08/18/2016 04/27/2015  Does Patient Have a Medical Advance Directive? _0  No No  Would patient like information on creating a medical advance directive? No - Patient declined No - Patient declined - No - Patient declined Yes (MAU/Ambulatory/Procedural Areas - Information given) - -  Pre-existing out of facility DNR order (yellow form or pink MOST form) - - - - - - -  Some encounter information is confidential and restricted. Go to Review Flowsheets activity to see all data.    Current Medications (verified) Outpatient Encounter Medications as of 11/01/2020  Medication Sig  . albuterol (PROVENTIL) (2.5 MG/3ML) 0.083% nebulizer solution Take 3 mLs (2.5 mg total) by nebulization every 6 (six) hours as needed for wheezing or shortness of breath.  Marland Kitchen albuterol (VENTOLIN HFA) 108 (90 Base) MCG/ACT inhaler TAKE 2 PUFFS BY MOUTH EVERY 6 HOURS AS NEEDED FOR WHEEZE OR SHORTNESS OF BREATH  . ALPRAZolam (XANAX) 0.5 MG tablet Take one tablet by mouth at bedtime for sleep  . amitriptyline (ELAVIL) 150 MG tablet Take 0.5 tablets (75 mg total) by mouth at bedtime.  Marland Kitchen azelastine (ASTELIN) 0.1 % nasal spray PLACE 2 SPRAYS INTO BOTH NOSTRILS 2 (TWO) TIMES DAILY. USE IN EACH NOSTRIL AS DIRECTED  . beclomethasone (QVAR REDIHALER) 40 MCG/ACT inhaler Inhale 2 puffs into the lungs 2 (two) times daily.  . budesonide-formoterol (SYMBICORT) 160-4.5 MCG/ACT inhaler Inhale 2 puffs into the lungs 2 (two) times daily. PRN  . cetirizine (ZYRTEC) 10 MG tablet Take 1 tablet (10 mg total) by mouth daily.  . cloNIDine (CATAPRES) 0.1 MG tablet TAKE 1 TABLET BY MOUTH EVERYDAY AT BEDTIME  .  clotrimazole-betamethasone (LOTRISONE) cream APPLY TO AFFECTED AREA TWICE A DAY  . cyclobenzaprine (FLEXERIL) 10 MG tablet Take one tablet by mouth at bedtime , as needed , for back spasm  . diclofenac (VOLTAREN) 75 MG EC tablet Take 1 tablet (75 mg total) by mouth 2 (two) times daily.  . DULoxetine (CYMBALTA) 20 MG capsule Take 1 capsule (20 mg total) by mouth daily.  Marland Kitchen EPINEPHrine 0.3 mg/0.3 mL IJ SOAJ injection   . fexofenadine-pseudoephedrine (ALLEGRA-D 24) 180-240 MG 24 hr tablet Take 1 tablet by mouth daily.  . fluticasone (FLONASE) 50 MCG/ACT nasal spray Place 1-2 sprays into both nostrils daily.  Marland Kitchen lidocaine (XYLOCAINE) 2 % jelly   . LINZESS 290 MCG CAPS capsule TAKE 1 CAPSULE (290 MCG TOTAL) BY MOUTH DAILY BEFORE BREAKFAST.  Marland Kitchen loratadine (CLARITIN) 10 MG tablet TAKE 1 TABLET BY MOUTH EVERY DAY  . Magnesium 400 MG CAPS Take 1 tablet by mouth 2 (two) times daily.  . medroxyPROGESTERone (PROVERA) 10 MG tablet TAKE 1 TABLET BY MOUTH DAILY FOR 2 WEEKS. REPEAT AS DIRECTED BY PHYSICIAN EVERY 3 MONTHS  . montelukast (SINGULAIR) 10 MG tablet TAKE 1 TABLET BY MOUTH EVERYDAY AT BEDTIME  . NARCAN 4 MG/0.1ML LIQD nasal spray kit Place 4 mg into the nose as directed.   . NONFORMULARY OR COMPOUNDED ITEM Apply 1-2 pumps, 3-4 times daily as needed.  Marland Kitchen olopatadine (PATANOL) 0.1 % ophthalmic solution INSTILL 1 DROP INTO BOTH EYES TWICE A DAY  . oxyCODONE (ROXICODONE) 5 MG immediate  release tablet Take 2 tablets (10 mg total) by mouth 2 (two) times daily.  . pantoprazole (PROTONIX) 40 MG tablet TAKE 1 TABLET BY MOUTH EVERY DAY  . potassium chloride 20 MEQ/15ML (10%) SOLN TAKE 15 MLS (20 MEQ TOTAL) BY MOUTH 2 (TWO) TIMES DAILY.  Marland Kitchen solifenacin (VESICARE) 5 MG tablet Take 1 tablet (5 mg total) by mouth daily.  . Tiotropium Bromide Monohydrate (SPIRIVA RESPIMAT) 1.25 MCG/ACT AERS Inhale 2 puffs into the lungs daily.  Marland Kitchen triamterene-hydrochlorothiazide (MAXZIDE-25) 37.5-25 MG tablet TAKE 1 TABLET BY MOUTH EVERY  DAY  . trolamine salicylate (ASPERCREME) 10 % cream Apply 1 application topically as needed for muscle pain.    Facility-Administered Encounter Medications as of 11/01/2020  Medication  . Mepolizumab SOLR 100 mg    Allergies (verified) Neurontin [gabapentin], Penicillins, Pregabalin, Tramadol, and Latex   History: Past Medical History:  Diagnosis Date  . Anemia   . Asthma   . Asthma flare 04/09/2013  . Back pain   . Bronchitis   . Chronic abdominal pain   . Chronic constipation   . Constipation due to opioid therapy   . Depression   . Depression, major, single episode, severe ([place]) 10/03/2018   PHQ 9 score of 15  . Diabetes mellitus without complication ([place])   . Diabetes mellitus, type II ([place])   . DVT (deep venous thrombosis) ([place]) 2010  . GERD (gastroesophageal reflux disease)   . Heart murmur    no cardiologist  . Helicobacter pylori gastritis 06/11/2013   Colonoscopy Dr. Hilarie Fredrickson  . Hypertension   . IBS (irritable bowel syndrome)   . Migraine headache   . Neuropathy   . Obesity   . Obsessive-compulsive disorder   . PSYCHOTIC D/O W/HALLUCINATIONS CONDS CLASS ELSW 03/04/2010   Qualifier: Diagnosis of  By: Moshe Cipro MD, Joycelyn Schmid    . PTSD (post-traumatic stress disorder)   . SBO (small bowel obstruction) ([place]) 08/09/2013  . Seasonal allergies 12/10/2012  . Seizures ([place])   . Shortness of breath    Past Surgical History:  Procedure Laterality Date  . ANTERIOR CERVICAL DECOMP/DISCECTOMY FUSION  07/07/2012   Procedure: ANTERIOR CERVICAL DECOMPRESSION/DISCECTOMY FUSION 2 LEVELS;  Surgeon: Floyce Stakes, MD;  Location: [facility];  Service: Neurosurgery;  Laterality: N/A;  Cervical four-five, five - six  Anterior cervical decompression/diskectomy/fusion/plate  . APPENDECTOMY  1986  . BOWEL RESECTION N/A 07/29/2013   Procedure: serosal repair;  Surgeon: Adin Hector, MD;  Location: [facility];  Service: General;  Laterality: N/A;  . CARPAL TUNNEL RELEASE Bilateral   . COLON  SURGERY N/A    [employer]rees[employer] 07/29/2020  . LAPAROSCOPY N/A 07/29/2013   Procedure: diagnostic laporoscopy;  Surgeon: Adin Hector, MD;  Location: [facility];  Service: General;  Laterality: N/A;  . LAPAROSCOPY N/A 08/16/2013   Procedure: LAPAROSCOPY DIAGNOSTIC/LYSIS OF ADHESIONS;  Surgeon: Adin Hector, MD;  Location: [facility];  Service: General;  Laterality: N/A;  . LAPAROTOMY N/A 08/16/2013   Procedure: EXPLORATORY LAPAROTOMY/SMALL BOWEL RESECTION (JEJUNUM);  Surgeon: Adin Hector, MD;  Location: [facility];  Service: General;  Laterality: N/A;  . LUMBAR SPINE SURGERY  2010   x 3  . LYSIS OF ADHESION  2003   Dr. Irving Shows  . LYSIS OF ADHESION N/A 07/29/2013   Procedure: LYSIS OF ADHESION;  Surgeon: Adin Hector, MD;  Location: [facility];  Service: General;  Laterality: N/A;  . OOPHORECTOMY    . PARTIAL HYSTERECTOMY  1990s?   [place]  . SPINAL  CORD STIMULATOR IMPLANT    . SPINE SURGERY N/A    [employer]reesia 07/29/2020  . TRIGGER FINGER RELEASE  2009   right pinkie finger  . TUBAL LIGATION  1994   Family History  Problem Relation Age of Onset  . Lung cancer Father   . Stomach cancer Father   . Esophageal cancer Father   . Alcohol abuse Father   . Mental illness Father   . Diabetes Sister   . Hypertension Sister   . Bipolar disorder Sister   . Schizophrenia Sister   . Diabetes Sister   . Alcohol abuse Brother   . Hypertension Brother   . Kidney disease Brother   . Diabetes Brother   . Drug abuse Brother   . Mental illness Brother   . Alcohol abuse Brother   . Alcohol abuse Brother   . Hypertension Brother   . Diabetes Brother   . Alcohol abuse Brother   . Physical abuse Mother   . Alcohol abuse Mother   . Cirrhosis Mother   . Mental illness Brother        in [place]  . Drug abuse Sister   . HIV Sister   . Alcohol abuse Brother   . Pneumonia Sister        died as a baby  . Alcohol abuse Brother   . Bipolar disorder Brother   . Bipolar disorder Daughter   . Bipolar  disorder Son   . Bipolar disorder Son   . Hypertension Brother   . Bipolar disorder Brother   . Drug abuse Brother   . Alcohol abuse Brother   . Bipolar disorder Brother   . ADD / ADHD Neg Hx   . Anxiety disorder Neg Hx   . Dementia Neg Hx   . Depression Neg Hx   . OCD Neg Hx   . Seizures Neg Hx   . Paranoid behavior Neg Hx   . Colon cancer Neg Hx    Social History   Socioeconomic History  . Marital status: Married    Spouse name: Not on file  . Number of children: 4  . Years of education: Not on file  . Highest education level: Not on file  Occupational History  . Not on file  Tobacco Use  . Smoking status: Never Smoker  . Smokeless tobacco: Never Used  Vaping Use  . Vaping Use: Never used  Substance and Sexual Activity  . Alcohol use: No  . Drug use: No  . Sexual activity: Yes    Partners: Male    Birth control/protection: Surgical    Comment: tubal  Other Topics Concern  . Not on file  Social History Narrative  . Not on file   Social Determinants of Health   Financial Resource Strain: Not on file  Food Insecurity: Not on file  Transportation Needs: Not on file  Physical Activity: Not on file  Stress: Not on file  Social Connections: Not on file    Tobacco Counseling Counseling given: Not Answered                  Diabetic? No          Activities of Daily Living In your present state of health, do you have any difficulty performing the following activities: 05/01/2020  Hearing? N  Vision? N  Difficulty concentrating or making decisions? N  Walking or climbing stairs? N  Dressing or bathing? N  Doing errands, shopping? N  Some recent data might  be hidden    Patient Care Team: Fayrene Helper, MD as PCP - General Pyrtle, Lajuan Lines, MD as Consulting Physician (Gastroenterology) Cloria Spring, MD as Consulting Physician ([place]) [place], Willey Blade, PA-C as Physician Assistant (Physician Assistant)  Indicate any  recent Medical Services you may have received from other than [facility] providers in the past year (date may be approximate).     Assessment:   This is a routine wellness examination for [place].  Hearing/Vision screen No exam data present  Dietary issues and exercise activities discussed:    Goals    . Exercise 3x per week (30 min per time)     Recommend starting a routine exercise program at least 3 days a week for 30-45 minutes at a time as tolerated.        Depression Screen PHQ 2/9 Scores 10/05/2020 08/31/2020 08/10/2020 08/10/2020 07/26/2020 05/12/2020 05/01/2020  PHQ - 2 Score 0 0 0 0 2 0 0  PHQ- 9 Score - - - - - - 0  Exception Documentation - - - - - - -  Some encounter information is confidential and restricted. Go to Review Flowsheets activity to see all data.    Fall Risk Fall Risk  10/05/2020 08/31/2020 08/10/2020 07/26/2020 07/26/2020  Falls in the past year? 0 0 0 1 1  Comment - - - - fall in Sept 2021  Number falls in past yr: 0 - 0 1 0  Comment - - - - -  Injury with Fall? 0 - 0 0 0  Comment - - - - -  Risk for fall due to : - History of fall(s) - - -  Follow up - - Falls evaluation completed - -    FALL RISK PREVENTION PERTAINING TO THE HOME:  Any stairs in or around the home? No  If so, are there any without handrails? No  Home free of loose throw rugs in walkways, pet beds, electrical cords, etc? Yes  Adequate lighting in your home to reduce risk of falls? Yes   ASSISTIVE DEVICES UTILIZED TO PREVENT FALLS:  Life alert? No  Use of a cane, walker or w/c? Yes  Grab bars in the bathroom? No  Shower chair or bench in shower? No  Elevated toilet seat or a handicapped toilet? No   TIMED UP AND GO:  Was the test performed? No .    Cognitive Function:     6CIT Screen 03/05/2017  What Year? 0 points  What month? 0 points  What time? 0 points  Count back from 20 0 points  Months in reverse 0 points  Repeat phrase 0 points  Total Score 0     Immunizations Immunization History  Administered Date(s) Administered  . Influenza Split 09/03/2012  . Influenza Whole 07/28/2007, 08/11/2009, 07/19/2010  . Influenza,inj,Quad PF,6+ Mos 09/13/2013, 08/12/2014, 08/01/2015, 07/24/2016, 08/14/2017, 08/12/2018, 08/10/2019, 07/11/2020  . Influenza-Unspecified 05/24/2018  . Moderna Sars-Covid-2 Vaccination 07/21/2020, 08/14/2020  . PPD Test 02/22/2011, 06/26/2011, 09/18/2012, 11/29/2013, 12/06/2014, 12/09/2017  . Td 10/03/2005  . Tdap 12/09/2017    TDAP status: Up to date  Flu Vaccine status: Up to date  Pneumococcal vaccine status: Up to date  Covid-19 vaccine status: Completed vaccines  Qualifies for Shingles Vaccine? Yes   Zostavax completed No   Shingrix Completed?: No.    Education has been provided regarding the importance of this vaccine. Patient has been advised to call insurance company to determine out of pocket expense if they have not yet received  this vaccine. Advised may also receive vaccine at local pharmacy or Health Dept. Verbalized acceptance and understanding.  Screening Tests Health Maintenance  Topic Date Due  . Hepatitis C Screening  Never done  . HEMOGLOBIN A1C  10/06/2020  . COVID-19 Vaccine (3 - Moderna risk 4-dose series) 11/28/2020 (Originally 09/11/2020)  . PNEUMOCOCCAL POLYSACCHARIDE VACCINE AGE 59-64 HIGH RISK  09/18/2022 (Originally 02/17/1973)  . MAMMOGRAM  06/08/2021  . COLONOSCOPY (Pts 45-43yr Insurance coverage will need to be confirmed)  06/12/2023  . PAP SMEAR-Modifier  08/11/2023  . TETANUS/TDAP  12/10/2027  . INFLUENZA VACCINE  Completed  . HIV Screening  Completed    Health Maintenance  Health Maintenance Due  Topic Date Due  . Hepatitis C Screening  Never done  . HEMOGLOBIN A1C  10/06/2020    Colorectal cancer screening: Type of screening: Colonoscopy. Completed normal. Repeat every 5 years  Mammogram status: Completed normal. Repeat every year  Bone Density Scan: Not  performed.   Lung Cancer Screening: (Low Dose CT Chest recommended if Age 51-80years, 30 pack-year currently smoking OR have quit w/in 15years.) does not qualify.    Additional Screening:  Hepatitis C Screening: does qualify; not done.  Vision Screening: Recommended annual ophthalmology exams for early detection of glaucoma and other disorders of the eye. Is the patient up to date with their annual eye exam?  Yes  Who is the provider or what is the name of the office in which the patient attends annual eye exams? [facility] If pt is not established with a provider, would they like to be referred to a provider to establish care? No .   Dental Screening: Recommended annual dental exams for proper oral hygiene  Community Resource Referral / Chronic Care Management: CRR required this visit?  No   CCM required this visit?  No      Plan:     I have personally reviewed and noted the following in the patient's chart:   . Medical and social history . Use of alcohol, tobacco or illicit drugs  . Current medications and supplements . Functional ability and status . Nutritional status . Physical activity . Advanced directives . List of other physicians . Hospitalizations, surgeries, and ER visits in previous 12 months . Vitals . Screenings to include cognitive, depression, and falls . Referrals and appointments  In addition, I have reviewed and discussed with patient certain preventive protocols, quality metrics, and best practice recommendations. A written personalized care plan for preventive services as well as general preventive health recommendations were provided to patient.     ALonn Georgia LPN   [id number]  Nurse Notes: AWV conducted over the phone with pt consent to televisit via audio. [place] in the [place]. Call took approx 242m.

## 2020-11-01 NOTE — Patient Instructions (Addendum)
Jane English , Thank you for taking time to come for your Medicare Wellness Visit. I appreciate your ongoing commitment to your health goals. Please review the following plan we discussed and let me know if I can assist you in the future.   Screening recommendations/referrals: Colonoscopy: Completed; due again 06/12/2023  Mammogram: Completed; due again 06/08/2021  Bone Density: Not eligible at this time.   Recommended yearly ophthalmology/optometry visit for glaucoma screening and checkup Recommended yearly dental visit for hygiene and checkup  Vaccinations: Influenza vaccine: Complete Pneumococcal vaccine: Complete; due next 09/18/2022 Tdap vaccine: Complete; due next 12/13/2027 Shingles vaccine: Education provided     Advanced directives: N/A   Conditions/risks identified: None   Next appointment: 11/14/20 @ 8:40am with Dr. Moshe Cipro   Preventive Care 40-64 Years, Female Preventive care refers to lifestyle choices and visits with your health care provider that can promote health and wellness. What does preventive care include?  A yearly physical exam. This is also called an annual well check.  Dental exams once or twice a year.  Routine eye exams. Ask your health care provider how often you should have your eyes checked.  Personal lifestyle choices, including:  Daily care of your teeth and gums.  Regular physical activity.  Eating a healthy diet.  Avoiding tobacco and drug use.  Limiting alcohol use.  Practicing safe sex.  Taking low-dose aspirin daily starting at age 56.  Taking vitamin and mineral supplements as recommended by your health care provider. What happens during an annual well check? The services and screenings done by your health care provider during your annual well check will depend on your age, overall health, lifestyle risk factors, and family history of disease. Counseling  Your health care provider may ask you questions about your:  Alcohol  use.  Tobacco use.  Drug use.  Emotional well-being.  Home and relationship well-being.  Sexual activity.  Eating habits.  Work and work Statistician.  Method of birth control.  Menstrual cycle.  Pregnancy history. Screening  You may have the following tests or measurements:  Height, weight, and BMI.  Blood pressure.  Lipid and cholesterol levels. These may be checked every 5 years, or more frequently if you are over 32 years old.  Skin check.  Lung cancer screening. You may have this screening every year starting at age 20 if you have a 30-pack-year history of smoking and currently smoke or have quit within the past 15 years.  Fecal occult blood test (FOBT) of the stool. You may have this test every year starting at age 51.  Flexible sigmoidoscopy or colonoscopy. You may have a sigmoidoscopy every 5 years or a colonoscopy every 10 years starting at age 19.  Hepatitis C blood test.  Hepatitis B blood test.  Sexually transmitted disease (STD) testing.  Diabetes screening. This is done by checking your blood sugar (glucose) after you have not eaten for a while (fasting). You may have this done every 1-3 years.  Mammogram. This may be done every 1-2 years. Talk to your health care provider about when you should start having regular mammograms. This may depend on whether you have a family history of breast cancer.  BRCA-related cancer screening. This may be done if you have a family history of breast, ovarian, tubal, or peritoneal cancers.  Pelvic exam and Pap test. This may be done every 3 years starting at age 49. Starting at age 49, this may be done every 5 years if you have a Pap test  in combination with an HPV test.  Bone density scan. This is done to screen for osteoporosis. You may have this scan if you are at high risk for osteoporosis. Discuss your test results, treatment options, and if necessary, the need for more tests with your health care  provider. Vaccines  Your health care provider may recommend certain vaccines, such as:  Influenza vaccine. This is recommended every year.  Tetanus, diphtheria, and acellular pertussis (Tdap, Td) vaccine. You may need a Td booster every 10 years.  Zoster vaccine. You may need this after age 60.  Pneumococcal 13-valent conjugate (PCV13) vaccine. You may need this if you have certain conditions and were not previously vaccinated.  Pneumococcal polysaccharide (PPSV23) vaccine. You may need one or two doses if you smoke cigarettes or if you have certain conditions. Talk to your health care provider about which screenings and vaccines you need and how often you need them. This information is not intended to replace advice given to you by your health care provider. Make sure you discuss any questions you have with your health care provider. Document Released: 11/17/2015 Document Revised: 07/10/2016 Document Reviewed: 08/22/2015 Elsevier Interactive Patient Education  2017 Elsevier Inc.    Fall Prevention in the Home Falls can cause injuries. They can happen to people of all ages. There are many things you can do to make your home safe and to help prevent falls. What can I do on the outside of my home?  Regularly fix the edges of walkways and driveways and fix any cracks.  Remove anything that might make you trip as you walk through a door, such as a raised step or threshold.  Trim any bushes or trees on the path to your home.  Use bright outdoor lighting.  Clear any walking paths of anything that might make someone trip, such as rocks or tools.  Regularly check to see if handrails are loose or broken. Make sure that both sides of any steps have handrails.  Any raised decks and porches should have guardrails on the edges.  Have any leaves, snow, or ice cleared regularly.  Use sand or salt on walking paths during winter.  Clean up any spills in your garage right away. This includes  oil or grease spills. What can I do in the bathroom?  Use night lights.  Install grab bars by the toilet and in the tub and shower. Do not use towel bars as grab bars.  Use non-skid mats or decals in the tub or shower.  If you need to sit down in the shower, use a plastic, non-slip stool.  Keep the floor dry. Clean up any water that spills on the floor as soon as it happens.  Remove soap buildup in the tub or shower regularly.  Attach bath mats securely with double-sided non-slip rug tape.  Do not have throw rugs and other things on the floor that can make you trip. What can I do in the bedroom?  Use night lights.  Make sure that you have a light by your bed that is easy to reach.  Do not use any sheets or blankets that are too big for your bed. They should not hang down onto the floor.  Have a firm chair that has side arms. You can use this for support while you get dressed.  Do not have throw rugs and other things on the floor that can make you trip. What can I do in the kitchen?  Clean up any   spills right away.  Avoid walking on wet floors.  Keep items that you use a lot in easy-to-reach places.  If you need to reach something above you, use a strong step stool that has a grab bar.  Keep electrical cords out of the way.  Do not use floor polish or wax that makes floors slippery. If you must use wax, use non-skid floor wax.  Do not have throw rugs and other things on the floor that can make you trip. What can I do with my stairs?  Do not leave any items on the stairs.  Make sure that there are handrails on both sides of the stairs and use them. Fix handrails that are broken or loose. Make sure that handrails are as long as the stairways.  Check any carpeting to make sure that it is firmly attached to the stairs. Fix any carpet that is loose or worn.  Avoid having throw rugs at the top or bottom of the stairs. If you do have throw rugs, attach them to the floor  with carpet tape.  Make sure that you have a light switch at the top of the stairs and the bottom of the stairs. If you do not have them, ask someone to add them for you. What else can I do to help prevent falls?  Wear shoes that:  Do not have high heels.  Have rubber bottoms.  Are comfortable and fit you well.  Are closed at the toe. Do not wear sandals.  If you use a stepladder:  Make sure that it is fully opened. Do not climb a closed stepladder.  Make sure that both sides of the stepladder are locked into place.  Ask someone to hold it for you, if possible.  Clearly mark and make sure that you can see:  Any grab bars or handrails.  First and last steps.  Where the edge of each step is.  Use tools that help you move around (mobility aids) if they are needed. These include:  Canes.  Walkers.  Scooters.  Crutches.  Turn on the lights when you go into a dark area. Replace any light bulbs as soon as they burn out.  Set up your furniture so you have a clear path. Avoid moving your furniture around.  If any of your floors are uneven, fix them.  If there are any pets around you, be aware of where they are.  Review your medicines with your doctor. Some medicines can make you feel dizzy. This can increase your chance of falling. Ask your doctor what other things that you can do to help prevent falls. This information is not intended to replace advice given to you by your health care provider. Make sure you discuss any questions you have with your health care provider. Document Released: 08/17/2009 Document Revised: 03/28/2016 Document Reviewed: 11/25/2014 Elsevier Interactive Patient Education  2017 Elsevier Inc.  

## 2020-11-08 ENCOUNTER — Other Ambulatory Visit: Payer: Self-pay | Admitting: Physical Medicine and Rehabilitation

## 2020-11-08 MED ORDER — DICLOFENAC SODIUM 1 % EX GEL: 2.0000 g | Freq: Four times a day (QID) | CUTANEOUS | 0 refills | Status: DC

## 2020-11-10 ENCOUNTER — Other Ambulatory Visit: Payer: Self-pay

## 2020-11-10 ENCOUNTER — Encounter: Payer: Self-pay | Admitting: Physical Medicine and Rehabilitation

## 2020-11-10 ENCOUNTER — Encounter: Payer: Medicare HMO | Attending: Physical Medicine and Rehabilitation | Admitting: Physical Medicine and Rehabilitation

## 2020-11-10 VITALS — BP 126/75 | HR 96 | Temp 98.8°F | Ht 66.0 in | Wt 185.8 lb

## 2020-11-10 DIAGNOSIS — M5416 Radiculopathy, lumbar region: Secondary | ICD-10-CM | POA: Diagnosis not present

## 2020-11-10 DIAGNOSIS — M5010 Cervical disc disorder with radiculopathy, unspecified cervical region: Secondary | ICD-10-CM | POA: Insufficient documentation

## 2020-11-10 DIAGNOSIS — M7918 Myalgia, other site: Secondary | ICD-10-CM

## 2020-11-10 DIAGNOSIS — M94261 Chondromalacia, right knee: Secondary | ICD-10-CM | POA: Insufficient documentation

## 2020-11-10 DIAGNOSIS — M94262 Chondromalacia, left knee: Secondary | ICD-10-CM

## 2020-11-10 MED ORDER — OXYCODONE HCL 5 MG PO TABS: 10.0000 mg | ORAL_TABLET | Freq: Two times a day (BID) | ORAL | 0 refills | Status: DC

## 2020-11-10 NOTE — Patient Instructions (Addendum)
Blue emu oil   Discussed following foods that may reduce pain: 1) Ginger 2) Blueberries 3) Salmon 4) Pumpkin seeds 5) dark chocolate 6) turmeric 7) tart cherries 8) virgin olive oil 9) chilli peppers 10) mint  Turmeric to reduce inflammation-can be used in cooking or taken as a supplement.  Benefits of turmeric:  -Highly anti-inflammatory  -Increases antioxidants  -Improves memory, attention, brain disease  -Lowers risk of heart disease  -May help prevent cancer  -Decreases pain  -Alleviates depression  -Delays aging and decreases risk of chronic disease  -Consume with black pepper to increase absorption    Turmeric Milk Recipe:  1 cup milk  1 tsp turmeric  1 tsp cinnamon  1 tsp grated ginger (optional)  Black pepper (boosts the anti-inflammatory properties of turmeric).  1 tsp honey

## 2020-11-10 NOTE — Progress Notes (Signed)
Subjective:    Patient ID: Jane English, female    DOB: [birth year], 50 y.o.   MRN: [id number]  Mrs. Sagar presents for follow up of failed back syndrome, right hip pain, bilateral knee pain, and cervical pain.  She called me earlier this week to request the Diclofenac gel.   Her knee pan continues to be better after viscosupplementation.   She wants to get her spinal stimulator out.   Since last visit she has developed plantar fasciitis. This has been well controlled.   She continues to have right buttock pain and she has been dealing with it. She has been doing swats, transitioning from hot and cold.   Had one more fall since last visit.   Prior history:  Since last visit she fell down 17 steps while cleaning the stairs. She has been using the Oxycodne which helps as well as 5% lidocaine patches, using the compounding cream. She is having a hard time sleeping at night. She had a bruise across the back.   R buttock pain: Pain radiates posteriorly down her thigh- it does not extend further. It feels constant, sharp, burning, dull, stabbing, tingling, and aching. It is worse with with walking, bending, sitting, and standing. This pain continues today.   Failed back syndrome: She requires a refill of her oxycodone for her chronic pain related to this issue. She has been taking medication as prescribed.   Asthma: She received 100mg  Nucala for her severe persistent asthma. Immunotherapy note reviewed.   Muscle cramps: She has been taking magnesium 400mg  daly and would like to take it twice per day. I have sent script today  Bilateral knee pain: She had good benefit from viscosupplementation in the past and would like to repeat next visit.   Pain Inventory Average Pain 8 Pain Right Now 8 My pain is constant, sharp, burning, dull, stabbing, tingling and aching  In the last 24 hours, has pain interfered with the following? General activity 7 Relation with others 8 Enjoyment of life  10 What TIME of day is your pain at its worst? morning daytime and night Sleep (in general) Fair  Pain is worse with: walking, bending, sitting, inactivity, standing and some activites Pain improves with: rest, heat/ice and medication Relief from Meds: 9     Family History  Problem Relation Age of Onset  . Lung cancer Father   . Stomach cancer Father   . Esophageal cancer Father   . Alcohol abuse Father   . Mental illness Father   . Diabetes Sister   . Hypertension Sister   . Bipolar disorder Sister   . Schizophrenia Sister   . Diabetes Sister   . Alcohol abuse Brother   . Hypertension Brother   . Kidney disease Brother   . Diabetes Brother   . Drug abuse Brother   . Mental illness Brother   . Alcohol abuse Brother   . Alcohol abuse Brother   . Hypertension Brother   . Diabetes Brother   . Alcohol abuse Brother   . Physical abuse Mother   . Alcohol abuse Mother   . Cirrhosis Mother   . Mental illness Brother        in [place]  . Drug abuse Sister   . HIV Sister   . Alcohol abuse Brother   . Pneumonia Sister        died as a baby  . Alcohol abuse Brother   . Bipolar disorder Brother   . Bipolar disorder  Daughter   . Bipolar disorder Son   . Bipolar disorder Son   . Hypertension Brother   . Bipolar disorder Brother   . Drug abuse Brother   . Alcohol abuse Brother   . Bipolar disorder Brother   . ADD / ADHD Neg Hx   . Anxiety disorder Neg Hx   . Dementia Neg Hx   . Depression Neg Hx   . OCD Neg Hx   . Seizures Neg Hx   . Paranoid behavior Neg Hx   . Colon cancer Neg Hx    Social History   Socioeconomic History  . Marital status: Married    Spouse name: Not on file  . Number of children: 4  . Years of education: Not on file  . Highest education level: Not on file  Occupational History  . Not on file  Tobacco Use  . Smoking status: Never Smoker  . Smokeless tobacco: Never Used  Vaping Use  . Vaping Use: Never used  Substance and Sexual Activity   . Alcohol use: No  . Drug use: No  . Sexual activity: Yes    Partners: Male    Birth control/protection: Surgical    Comment: tubal  Other Topics Concern  . Not on file  Social History Narrative  . Not on file   Social Determinants of Health   Financial Resource Strain: Low Risk   . Difficulty of Paying Living Expenses: Not very hard  Food Insecurity: No Food Insecurity  . Worried About Charity fundraiser in the Last Year: Never true  . Ran Out of Food in the Last Year: Never true  Transportation Needs: No Transportation Needs  . Lack of Transportation (Medical): No  . Lack of Transportation (Non-Medical): No  Physical Activity: Insufficiently Active  . Days of Exercise per Week: 4 days  . Minutes of Exercise per Session: 30 min  Stress: Stress Concern Present  . Feeling of Stress : To some extent  Social Connections: Socially Isolated  . Frequency of Communication with Friends and Family: Three times a week  . Frequency of Social Gatherings with Friends and Family: Twice a week  . Attends Religious Services: Never  . Active Member of Clubs or Organizations: No  . Attends Archivist Meetings: Never  . Marital Status: Separated   Past Surgical History:  Procedure Laterality Date  . ANTERIOR CERVICAL DECOMP/DISCECTOMY FUSION  07/07/2012   Procedure: ANTERIOR CERVICAL DECOMPRESSION/DISCECTOMY FUSION 2 LEVELS;  Surgeon: Floyce Stakes, MD;  Location: [facility];  Service: Neurosurgery;  Laterality: N/A;  Cervical four-five, five - six  Anterior cervical decompression/diskectomy/fusion/plate  . APPENDECTOMY  1986  . BOWEL RESECTION N/A 07/29/2013   Procedure: serosal repair;  Surgeon: Adin Hector, MD;  Location: [facility];  Service: General;  Laterality: N/A;  . CARPAL TUNNEL RELEASE Bilateral   . COLON SURGERY N/A    Phreesia 07/29/2020  . LAPAROSCOPY N/A 07/29/2013   Procedure: diagnostic laporoscopy;  Surgeon: Adin Hector, MD;  Location: [facility];  Service:  General;  Laterality: N/A;  . LAPAROSCOPY N/A 08/16/2013   Procedure: LAPAROSCOPY DIAGNOSTIC/LYSIS OF ADHESIONS;  Surgeon: Adin Hector, MD;  Location: [facility];  Service: General;  Laterality: N/A;  . LAPAROTOMY N/A 08/16/2013   Procedure: EXPLORATORY LAPAROTOMY/SMALL BOWEL RESECTION (JEJUNUM);  Surgeon: Adin Hector, MD;  Location: [facility];  Service: General;  Laterality: N/A;  . LUMBAR SPINE SURGERY  2010   x 3  . LYSIS OF ADHESION  2003   Dr. Irving Shows  . LYSIS OF ADHESION N/A 07/29/2013   Procedure: LYSIS OF ADHESION;  Surgeon: Adin Hector, MD;  Location: [facility];  Service: General;  Laterality: N/A;  . OOPHORECTOMY    . PARTIAL HYSTERECTOMY  1990s?   [place], [place]  . SPINAL CORD STIMULATOR IMPLANT    . SPINE SURGERY N/A    Phreesia 07/29/2020  . TRIGGER FINGER RELEASE  2009   right pinkie finger  . TUBAL LIGATION  1994   Past Medical History:  Diagnosis Date  . Anemia   . Asthma   . Asthma flare 04/09/2013  . Back pain   . Bronchitis   . Chronic abdominal pain   . Chronic constipation   . Constipation due to opioid therapy   . Depression   . Depression, major, single episode, severe ([place]) 10/03/2018   PHQ 9 score of 15  . Diabetes mellitus without complication ([place])   . Diabetes mellitus, type II ([place])   . DVT (deep venous thrombosis) ([place]) 2010  . GERD (gastroesophageal reflux disease)   . Heart murmur    no cardiologist  . Helicobacter pylori gastritis 06/11/2013   Colonoscopy Dr. Hilarie Fredrickson  . Hypertension   . IBS (irritable bowel syndrome)   . Migraine headache   . Neuropathy   . Obesity   . Obsessive-compulsive disorder   . PSYCHOTIC D/O W/HALLUCINATIONS CONDS CLASS ELSW 03/04/2010   Qualifier: Diagnosis of  By: Moshe Cipro MD, Joycelyn Schmid    . PTSD (post-traumatic stress disorder)   . SBO (small bowel obstruction) ([place]) 08/09/2013  . Seasonal allergies 12/10/2012  . Seizures ([place])   . Shortness of breath    There were no vitals taken for this visit.  Opioid  Risk Score:   Fall Risk Score:  `1  Depression screen PHQ 2/9  Depression screen [facility] 2/9 11/01/2020 11/01/2020 10/05/2020 08/31/2020 08/10/2020 08/10/2020 07/26/2020  Decreased Interest 0 0 0 0 0 0 1  Down, Depressed, Hopeless 1 1 0 0 0 0 1  PHQ - 2 Score 1 1 0 0 0 0 2  Altered sleeping - - - - - - -  Tired, decreased energy - - - - - - -  Change in appetite - - - - - - -  Feeling bad or failure about yourself  - - - - - - -  Trouble concentrating - - - - - - -  Moving slowly or fidgety/restless - - - - - - -  Suicidal thoughts - - - - - - -  PHQ-9 Score - - - - - - -  Difficult doing work/chores - - - - - - -  Some recent data might be hidden   Review of Systems  Constitutional: Negative.   HENT: Negative.   Eyes: Negative.   Respiratory: Positive for wheezing.   Cardiovascular: Negative.   Endocrine: Negative.   Genitourinary: Negative.   Musculoskeletal: Positive for back pain and gait problem.       Spasms  Allergic/Immunologic: Negative.   Neurological: Positive for weakness.       Tingling & numbness  Hematological: Negative.   Psychiatric/Behavioral: Negative.        Objective:   Physical Exam Gen: no distress, normal appearing HEENT: oral mucosa pink and moist, NCAT Cardio: Reg rate Chest: normal effort, normal rate of breathing Abd: soft, non-distended Ext: no edema Skin: intact Neuro: Alert and oriented x3.  5/5 left lower extremity strength.  4/5 right lower extremity  strength- pain limited.  +Slump test on left for pain radiating in S1 nerve distribution.  Tenderness to palpation over right buttock.  No tenderness to palpation over her knees.  Psych: pleasant, normal affect     Assessment & Plan:  Mrs. Langton is a 50 year old woman who follows for failed back syndrome, who presents today for exacerbation of right buttock pain, as well as bilateral knee pain.   R buttock pain: -Radiates in S1 distribution, associated with weakness on right side,  +Slump test. -She has spinal cord stimulator which was a barrier to getting the MRI. She would like to get this removed. Discussed that she can phone [facility] as she is an established patient there.  -She has been able to manage this pain well.   Failed back syndrome: -Continue Oxycodone 10mg  BID. She has been on this dose for many years and it has helped her cope with her pain and maintain her job. Continued to have no negative side effects. UDS obtained routinely and has expected metabolites. -Continue Diclofenac gel -Recommended blue emu oil as well. -Discussed [employer] PNS system as an option of pain treatment via neuromodulation. Provided following link for patient to learn more about the system: https://www.sprtherapeutics.com/.  -Discussed current symptoms of pain and history of pain.  -Discussed benefits of exercise in reducing pain. -Discussed following foods that may reduce pain: 1) Ginger 2) Blueberries 3) Salmon 4) Pumpkin seeds 5) dark chocolate 6) turmeric 7) tart cherries 8) virgin olive oil 9) chilli peppers 10) mint   Recent dental surgery: Did not take any paid meds from dentist.   Bilateral knee OA; Her pain has improved with viscosupplementation.   Mood: Very positive. Very excited about her grandchildren.   Muscle cramps: Increase magnesium to 400mg  BID.   Plantar fasciitis: Bilateral, provided with home exercises, educated regarding typical treatments. Recommended rolling plantar aspect of feet on tennis balls. She has been doing this and symptoms have improved without boot.  Quality of life: She is enjoying work. Had a good Thanksgiving and plans to celebrate Christmas with her son in [place].   RTC in 1 month.

## 2020-11-14 ENCOUNTER — Telehealth (INDEPENDENT_AMBULATORY_CARE_PROVIDER_SITE_OTHER): Payer: Medicare HMO | Admitting: Family Medicine

## 2020-11-14 ENCOUNTER — Other Ambulatory Visit: Payer: Self-pay

## 2020-11-14 VITALS — BP 128/75 | Ht 66.0 in | Wt 189.0 lb

## 2020-11-14 DIAGNOSIS — F5104 Psychophysiologic insomnia: Secondary | ICD-10-CM | POA: Diagnosis not present

## 2020-11-14 DIAGNOSIS — I10 Essential (primary) hypertension: Secondary | ICD-10-CM | POA: Diagnosis not present

## 2020-11-14 DIAGNOSIS — E66811 Obesity, class 1: Secondary | ICD-10-CM

## 2020-11-14 DIAGNOSIS — F411 Generalized anxiety disorder: Secondary | ICD-10-CM

## 2020-11-14 DIAGNOSIS — E669 Obesity, unspecified: Secondary | ICD-10-CM | POA: Diagnosis not present

## 2020-11-14 MED ORDER — ALPRAZOLAM 0.25 MG PO TABS: 0.2500 mg | ORAL_TABLET | Freq: Every day | ORAL | 1 refills | Status: DC

## 2020-11-14 MED ORDER — HYDROXYZINE HCL 10 MG PO TABS: ORAL_TABLET | ORAL | 1 refills | Status: DC

## 2020-11-14 NOTE — Progress Notes (Signed)
Virtual Visit via Telephone Note  I connected with Henefer on 11/14/20 at  8:40 AM EST by telephone and verified that I am speaking with the correct person using two identifiers.  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 I also discussed with the patient that there may be a patient responsible charge related to this service. The patient expressed understanding and agreed to proceed.   History of Present Illness: F/U chronic problems C/o increased anxiety which is disabling Denies recent fever or chills. Denies sinus pressure, nasal congestion, ear pain or sore throat. Denies chest congestion, productive cough or wheezing. Denies chest pains, palpitations and leg swelling Denies abdominal pain, nausea, vomiting,diarrhea or constipation.   Denies dysuria, frequency, hesitancy or incontinence. Denies joint pain, swelling and limitation in mobility. Denies headaches, seizures, numbness, or tingling.      Observations/Objective: BP 128/75   Ht 5\' 6"  (1.676 m)   Wt 189 lb (85.7 kg)   BMI 30.51 kg/m  Good communication with no confusion and intact memory. Alert and oriented x 3 No signs of respiratory distress during speech   Assessment and Plan: GAD (generalized anxiety disorder) Uncontrolled and severe with increased difficulty sleeping Start hydroxyzine scheduled  Insomnia Sleep hygiene reviewed and written information offered also. Prescription sent for  medication needed.   Obesity (BMI 30.0-34.9)  Patient re-educated about  the importance of commitment to a  minimum of 150 minutes of exercise per week as able.  The importance of healthy food choices with portion control discussed, as well as eating regularly and within a 12 hour window most days. The need to choose "clean , green" food  50 to 75% of the time is discussed, as well as to make water the primary drink and set a goal of 64 ounces water daily.    Weight /BMI 11/14/2020 11/10/2020 10/05/2020  WEIGHT 189 lb 185 lb 12.8 oz 183 lb  HEIGHT 5\' 6"  5\' 6"  5\' 6"   BMI 30.51 kg/m2 29.99 kg/m2 29.54 kg/m2  Some encounter information is confidential and restricted. Go to Review Flowsheets activity to see all data.    Start phentermine half daily  Essential hypertension Controlled, no change in medication DASH diet and commitment to daily physical activity for a minimum of 30 minutes discussed and encouraged, as a part of hypertension management. The importance of attaining a healthy weight is also discussed.  BP/Weight 11/14/2020 11/10/2020 10/05/2020 08/31/2020 08/10/2020 07/26/2020 03/06/5464  Systolic BP 681 275 170 017 494 496 759  Diastolic BP 75 75 76 71 84 86 74  Wt. (Lbs) 189 185.8 183 183.6 185 185.6 183  BMI 30.51 29.99 29.54 29.63 29.86 29.96 29.54  Some encounter information is confidential and restricted. Go to Review Flowsheets activity to see all data.         Follow Up Instructions:    I discussed the assessment and treatment plan with the patient. The patient was provided an opportunity to ask questions and all were answered. The patient agreed with the plan and demonstrated an understanding of the instructions.   The patient was advised to call back or seek an in-person evaluation if the symptoms worsen or if the condition fails to improve as anticipated.  I provided 22 minutes of non-face-to-face time during this encounter.   Tula Nakayama, MD

## 2020-11-14 NOTE — Patient Instructions (Addendum)
F/U in 4 week phone visit re eval;uate anxiet  And depression, and sleep  New for anxiety is hydroxyzine one tablet 3 times daily OR one in the morning and two at night Reduce dose of xanax prescribed,0.25 mg take ONE  Tablet  once daily at bedtime    It is important that you exercise regularly at least 30 minutes 5 times a week. If you develop chest pain, have severe difficulty breathing, or feel very tired, stop exercising immediately and seek medical attention   Think about what you will eat, plan ahead. Choose " clean, green, fresh or frozen" over canned, processed or packaged foods which are more sugary, salty and fatty. 70 to 75% of food eaten should be vegetables and fruit. Three meals at set times with snacks allowed between meals, but they must be fruit or vegetables. Aim to eat over a 12 hour period , example 7 am to 7 pm, and STOP after  your last meal of the day. Drink water,generally about 64 ounces per day, no other drink is as healthy. Fruit juice is best enjoyed in a healthy way, by EATING the fruit.  Thanks for choosing [facility], we consider it a privelige to serve you.

## 2020-11-16 ENCOUNTER — Other Ambulatory Visit: Payer: Self-pay

## 2020-11-16 ENCOUNTER — Ambulatory Visit: Payer: Medicare HMO | Admitting: Podiatry

## 2020-11-16 ENCOUNTER — Encounter: Payer: Self-pay | Admitting: Podiatry

## 2020-11-16 DIAGNOSIS — M722 Plantar fascial fibromatosis: Secondary | ICD-10-CM

## 2020-11-16 DIAGNOSIS — M779 Enthesopathy, unspecified: Secondary | ICD-10-CM

## 2020-11-16 MED ORDER — TRIAMCINOLONE ACETONIDE 10 MG/ML IJ SUSP
10.0000 mg | Freq: Once | INTRAMUSCULAR | Status: AC
  Administered 2020-11-16: 10 mg

## 2020-11-18 ENCOUNTER — Encounter: Payer: Self-pay | Admitting: Family Medicine

## 2020-11-18 MED ORDER — PHENTERMINE HCL 37.5 MG PO TABS: ORAL_TABLET | ORAL | 0 refills | Status: DC

## 2020-11-18 NOTE — Assessment & Plan Note (Signed)
Uncontrolled and severe with increased difficulty sleeping Start hydroxyzine scheduled

## 2020-11-18 NOTE — Assessment & Plan Note (Signed)
  Patient re-educated about  the importance of commitment to a  minimum of 150 minutes of exercise per week as able.  The importance of healthy food choices with portion control discussed, as well as eating regularly and within a 12 hour window most days. The need to choose "clean , green" food 50 to 75% of the time is discussed, as well as to make water the primary drink and set a goal of 64 ounces water daily.    Weight /BMI 11/14/2020 11/10/2020 10/05/2020  WEIGHT 189 lb 185 lb 12.8 oz 183 lb  HEIGHT 5\' 6"  5\' 6"  5\' 6"   BMI 30.51 kg/m2 29.99 kg/m2 29.54 kg/m2  Some encounter information is confidential and restricted. Go to Review Flowsheets activity to see all data.    Start phentermine half daily

## 2020-11-18 NOTE — Assessment & Plan Note (Signed)
Controlled, no change in medication DASH diet and commitment to daily physical activity for a minimum of 30 minutes discussed and encouraged, as a part of hypertension management. The importance of attaining a healthy weight is also discussed.  BP/Weight 11/14/2020 11/10/2020 10/05/2020 08/31/2020 08/10/2020 07/26/2020 9/38/1829  Systolic BP 937 169 678 938 101 751 025  Diastolic BP 75 75 76 71 84 86 74  Wt. (Lbs) 189 185.8 183 183.6 185 185.6 183  BMI 30.51 29.99 29.54 29.63 29.86 29.96 29.54  Some encounter information is confidential and restricted. Go to Review Flowsheets activity to see all data.

## 2020-11-18 NOTE — Assessment & Plan Note (Signed)
Sleep hygiene reviewed and written information offered also. Prescription sent for  medication needed.  

## 2020-11-20 ENCOUNTER — Ambulatory Visit: Payer: Medicare HMO | Admitting: Internal Medicine

## 2020-11-20 NOTE — Progress Notes (Signed)
Subjective:   Patient ID: Jane English, female   DOB: 50 y.o.   MRN: [id number]   HPI Patient presents stating her bone seems better but she is having a lot of pain in the bottom of her heel and she would like to have that treated if possible.  Has been complaining of pain in her ankle also which appears to be secondary   ROS      Objective:  Physical Exam  Neurovascular status intact with pain that is mostly concentrated plantar aspect left heel and is also noted to have inflammation pain of the sinus tarsi with fluid buildup around the joint surface     Assessment:  Acute plantar fasciitis left with improving heel bone with inflammatory changes sinus tarsi left     Plan:  H NP removed both conditions separately.  For the capsulitis boot usage along with ice therapy and topical medicine recommended and for the heel I did sterile prep and injected the fascia 3 mg Kenalog 5 mg Xylocaine and instructed on anti-inflammatories.  Patient will be seen back for [place] to recheck his symptoms indicate

## 2020-12-12 ENCOUNTER — Encounter: Payer: Self-pay | Admitting: Family Medicine

## 2020-12-12 ENCOUNTER — Other Ambulatory Visit: Payer: Self-pay

## 2020-12-12 ENCOUNTER — Telehealth (INDEPENDENT_AMBULATORY_CARE_PROVIDER_SITE_OTHER): Payer: Medicare HMO | Admitting: Family Medicine

## 2020-12-12 VITALS — Ht 66.0 in | Wt 189.1 lb

## 2020-12-12 DIAGNOSIS — F322 Major depressive disorder, single episode, severe without psychotic features: Secondary | ICD-10-CM

## 2020-12-12 DIAGNOSIS — F419 Anxiety disorder, unspecified: Secondary | ICD-10-CM

## 2020-12-12 MED ORDER — FLUOXETINE HCL 20 MG PO TABS: 20.0000 mg | ORAL_TABLET | Freq: Every day | ORAL | 3 refills | Status: DC

## 2020-12-12 NOTE — Patient Instructions (Signed)
F/U in 5 weeks, call if you need me sooner  New medication for anxiety and depression is fluoxetine 20 mg one daily  Incrase hydroxyzine 10 mg to taking TWO tablets at night, if still waking up early after 5 days, you may take hydroxyzine 10 mg THREE tablets at bedtime  I strongly recommend Psychiatry management and if you decide to follow through on this recommendation, please get in touch so this can be arranged  Thanks for choosing [facility], we consider it a privelige to serve you.

## 2020-12-16 ENCOUNTER — Encounter: Payer: Self-pay | Admitting: Family Medicine

## 2020-12-16 DIAGNOSIS — F419 Anxiety disorder, unspecified: Secondary | ICD-10-CM | POA: Insufficient documentation

## 2020-12-16 NOTE — Progress Notes (Signed)
Virtual Visit via Telephone Note  I connected with Jane English on 12/16/20 at 10:40 AM EST by telephone and verified that I am speaking with the correct person using two identifiers.  Location: Patient: home Provider: work  I discussed the limitations, risks, security and privacy concerns of performing an evaluation and management service by telephone and the availability of in person appointments. I also discussed with the patient that there may be a patient responsible charge related to this service. The patient expressed understanding and agreed to proceed.   History of Present Illness: F/u uncontrolled depression and anxiety No improvement with medication, actually feels worse, not suicidal or homicidal, still unwilling to see psych Copes with depression by spending excessively and working excessively, on her own at home , " everything crashes"   Observations/Objective: Ht 5\' 6"  (1.676 m)   Wt 189 lb 1.6 oz (85.8 kg)   BMI 30.52 kg/m  Good communication with no confusion and intact memory. Alert and oriented x 3 No signs of respiratory distress during speech    Assessment and Plan:  Depression, major, single episode, severe (HCC) Start fluoxetine 20 mg daily and re assess in 5 weeks, Psych management strongly encouraged, pt refuses, not suicidal or homicidal  Anxiety Reports uncontrolled anxiety and excessive personal stress esp as it relates to paying bills and her adult  Children, continue xanax  As before , tart fluoxetine 20 mg daily. Psych recommended , she refuses   Follow Up Instructions:    I discussed the assessment and treatment plan with the patient. The patient was provided an opportunity to ask questions and all were answered. The patient agreed with the plan and demonstrated an understanding of the instructions.   The patient was advised to call back or seek an in-person evaluation if the symptoms worsen or if the condition fails to improve as  anticipated.  I provided 12 minutes of non-face-to-face time during this encounter.   Tula Nakayama, MD

## 2020-12-16 NOTE — Assessment & Plan Note (Signed)
Reports uncontrolled anxiety and excessive personal stress esp as it relates to paying bills and her adult  Children, continue xanax  As before , tart fluoxetine 20 mg daily. Psych recommended , she refuses

## 2020-12-16 NOTE — Assessment & Plan Note (Signed)
Start fluoxetine 20 mg daily and re assess in 5 weeks, Psych management strongly encouraged, pt refuses, not suicidal or homicidal

## 2020-12-19 ENCOUNTER — Other Ambulatory Visit: Payer: Self-pay

## 2020-12-19 ENCOUNTER — Encounter: Payer: Self-pay | Admitting: Physical Medicine and Rehabilitation

## 2020-12-19 ENCOUNTER — Encounter: Payer: Medicare HMO | Attending: Physical Medicine and Rehabilitation | Admitting: Physical Medicine and Rehabilitation

## 2020-12-19 VITALS — BP 106/72 | HR 78 | Temp 98.2°F | Ht 66.0 in | Wt 183.4 lb

## 2020-12-19 DIAGNOSIS — M19041 Primary osteoarthritis, right hand: Secondary | ICD-10-CM | POA: Insufficient documentation

## 2020-12-19 DIAGNOSIS — M722 Plantar fascial fibromatosis: Secondary | ICD-10-CM

## 2020-12-19 DIAGNOSIS — M19042 Primary osteoarthritis, left hand: Secondary | ICD-10-CM | POA: Diagnosis not present

## 2020-12-19 DIAGNOSIS — Z5181 Encounter for therapeutic drug level monitoring: Secondary | ICD-10-CM | POA: Diagnosis not present

## 2020-12-19 DIAGNOSIS — Z79891 Long term (current) use of opiate analgesic: Secondary | ICD-10-CM

## 2020-12-19 DIAGNOSIS — M94262 Chondromalacia, left knee: Secondary | ICD-10-CM | POA: Diagnosis not present

## 2020-12-19 DIAGNOSIS — M5136 Other intervertebral disc degeneration, lumbar region: Secondary | ICD-10-CM | POA: Insufficient documentation

## 2020-12-19 DIAGNOSIS — M94261 Chondromalacia, right knee: Secondary | ICD-10-CM

## 2020-12-19 DIAGNOSIS — G894 Chronic pain syndrome: Secondary | ICD-10-CM | POA: Diagnosis not present

## 2020-12-19 MED ORDER — OXYCODONE HCL 5 MG PO TABS: 10.0000 mg | ORAL_TABLET | Freq: Two times a day (BID) | ORAL | 0 refills | Status: DC

## 2020-12-19 NOTE — Progress Notes (Signed)
Subjective:    Patient ID: Jane English, female    DOB: 02-03-1971, 50 y.o.   MRN: [id number]  Mrs. Jane English presents for follow-up of failed back syndrome, right hip pain, bilateral knee pain, and cervical pain.  1) Failed back syndrome: -She has been having myofascial trigger points with associated muscle spasms.  -Otherwise pain has been stable.  -She has communicating with a [employer] Rep to see if her current SCS can be removed but was told that the leads cannot be removed. She provided me with Mrs. Jane English's phone number. She wants to get her spinal stimulator out.   -I have refilled her Diclofenac gel. She has been using this on her low back with good benefit. She   2) Bilateral knee OA Her knee pan continues to be better after viscosupplementation. They are not hurting any more!  3) Plantar fasciitis- well controlled with exercise.   Prior history: She continues to have right buttock pain and she has been dealing with it. She has been doing swats, transitioning from hot and cold.   Had one more fall since last visit.   Since last visit she fell down 17 steps while cleaning the stairs. She has been using the Oxycodne which helps as well as 5% lidocaine patches, using the compounding cream. She is having a hard time sleeping at night. She had a bruise across the back.   R buttock pain: Pain radiates posteriorly down her thigh- it does not extend further. It feels constant, sharp, burning, dull, stabbing, tingling, and aching. It is worse with with walking, bending, sitting, and standing. This pain continues today.   Failed back syndrome: She requires a refill of her oxycodone for her chronic pain related to this issue. She has been taking medication as prescribed.   Asthma: She received 100mg  Nucala for her severe persistent asthma. Immunotherapy note reviewed.   Muscle cramps: She has been taking magnesium 400mg  daly and would like to take it twice per day. I have sent script  today  Bilateral knee pain: She had good benefit from viscosupplementation in the past and would like to repeat next visit.   Pain Inventory Average Pain 9 Pain Right Now 7 My pain is constant, sharp, burning, dull, stabbing, tingling and aching  In the last 24 hours, has pain interfered with the following? General activity 8 Relation with others 9 Enjoyment of life 7 What TIME of day is your pain at its worst? morning daytime and night Sleep (in general) Fair  Pain is worse with: walking, bending, sitting, inactivity, standing and some activites Pain improves with: rest, heat/ice and medication Relief from Meds: 1010     Family History  Problem Relation Age of Onset  . Lung cancer Father   . Stomach cancer Father   . Esophageal cancer Father   . Alcohol abuse Father   . Mental illness Father   . Diabetes Sister   . Hypertension Sister   . Bipolar disorder Sister   . Schizophrenia Sister   . Diabetes Sister   . Alcohol abuse Brother   . Hypertension Brother   . Kidney disease Brother   . Diabetes Brother   . Drug abuse Brother   . Mental illness Brother   . Alcohol abuse Brother   . Alcohol abuse Brother   . Hypertension Brother   . Diabetes Brother   . Alcohol abuse Brother   . Physical abuse Mother   . Alcohol abuse Mother   . Cirrhosis  Mother   . Mental illness Brother        in [place]  . Drug abuse Sister   . HIV Sister   . Alcohol abuse Brother   . Pneumonia Sister        died as a baby  . Alcohol abuse Brother   . Bipolar disorder Brother   . Bipolar disorder Daughter   . Bipolar disorder Son   . Bipolar disorder Son   . Hypertension Brother   . Bipolar disorder Brother   . Drug abuse Brother   . Alcohol abuse Brother   . Bipolar disorder Brother   . ADD / ADHD Neg Hx   . Anxiety disorder Neg Hx   . Dementia Neg Hx   . Depression Neg Hx   . OCD Neg Hx   . Seizures Neg Hx   . Paranoid behavior Neg Hx   . Colon cancer Neg Hx    Social  History   Socioeconomic History  . Marital status: Married    Spouse name: Not on file  . Number of children: 4  . Years of education: Not on file  . Highest education level: Not on file  Occupational History  . Not on file  Tobacco Use  . Smoking status: Never Smoker  . Smokeless tobacco: Never Used  Vaping Use  . Vaping Use: Never used  Substance and Sexual Activity  . Alcohol use: No  . Drug use: No  . Sexual activity: Yes    Partners: Male    Birth control/protection: Surgical    Comment: tubal  Other Topics Concern  . Not on file  Social History Narrative  . Not on file   Social Determinants of Health   Financial Resource Strain: Low Risk   . Difficulty of Paying Living Expenses: Not very hard  Food Insecurity: No Food Insecurity  . Worried About Charity fundraiser in the Last Year: Never true  . Ran Out of Food in the Last Year: Never true  Transportation Needs: No Transportation Needs  . Lack of Transportation (Medical): No  . Lack of Transportation (Non-Medical): No  Physical Activity: Insufficiently Active  . Days of Exercise per Week: 4 days  . Minutes of Exercise per Session: 30 min  Stress: Stress Concern Present  . Feeling of Stress : To some extent  Social Connections: Socially Isolated  . Frequency of Communication with Friends and Family: Three times a week  . Frequency of Social Gatherings with Friends and Family: Twice a week  . Attends Religious Services: Never  . Active Member of Clubs or Organizations: No  . Attends Archivist Meetings: Never  . Marital Status: Separated   Past Surgical History:  Procedure Laterality Date  . ANTERIOR CERVICAL DECOMP/DISCECTOMY FUSION  07/07/2012   Procedure: ANTERIOR CERVICAL DECOMPRESSION/DISCECTOMY FUSION 2 LEVELS;  Surgeon: Floyce Stakes, MD;  Location: [facility];  Service: Neurosurgery;  Laterality: N/A;  Cervical four-five, five - six  Anterior cervical decompression/diskectomy/fusion/plate   . APPENDECTOMY  1986  . BOWEL RESECTION N/A 07/29/2013   Procedure: serosal repair;  Surgeon: Adin Hector, MD;  Location: [facility];  Service: General;  Laterality: N/A;  . CARPAL TUNNEL RELEASE Bilateral   . COLON SURGERY N/A    Phreesia 07/29/2020  . LAPAROSCOPY N/A 07/29/2013   Procedure: diagnostic laporoscopy;  Surgeon: Adin Hector, MD;  Location: [facility];  Service: General;  Laterality: N/A;  . LAPAROSCOPY N/A 08/16/2013   Procedure: LAPAROSCOPY  DIAGNOSTIC/LYSIS OF ADHESIONS;  Surgeon: Adin Hector, MD;  Location: [facility];  Service: General;  Laterality: N/A;  . LAPAROTOMY N/A 08/16/2013   Procedure: EXPLORATORY LAPAROTOMY/SMALL BOWEL RESECTION (JEJUNUM);  Surgeon: Adin Hector, MD;  Location: [facility];  Service: General;  Laterality: N/A;  . LUMBAR SPINE SURGERY  2010   x 3  . LYSIS OF ADHESION  2003   Dr. Irving Shows  . LYSIS OF ADHESION N/A 07/29/2013   Procedure: LYSIS OF ADHESION;  Surgeon: Adin Hector, MD;  Location: [facility];  Service: General;  Laterality: N/A;  . OOPHORECTOMY    . PARTIAL HYSTERECTOMY  1990s?   [place], [place]  . SPINAL CORD STIMULATOR IMPLANT    . SPINE SURGERY N/A    Phreesia 07/29/2020  . TRIGGER FINGER RELEASE  2009   right pinkie finger  . TUBAL LIGATION  1994   Past Medical History:  Diagnosis Date  . Anemia   . Asthma   . Asthma flare 04/09/2013  . Back pain   . Bronchitis   . Chronic abdominal pain   . Chronic constipation   . Constipation due to opioid therapy   . Depression   . Depression, major, single episode, severe ([place]) 10/03/2018   PHQ 9 score of 15  . Diabetes mellitus without complication ([place])   . Diabetes mellitus, type II ([place])   . DVT (deep venous thrombosis) ([place]) 2010  . GERD (gastroesophageal reflux disease)   . Heart murmur    no cardiologist  . Helicobacter pylori gastritis 06/11/2013   Colonoscopy Dr. Hilarie Fredrickson  . Hypertension   . IBS (irritable bowel syndrome)   . Migraine headache   . Neuropathy   .  Obesity   . Obsessive-compulsive disorder   . PSYCHOTIC D/O W/HALLUCINATIONS CONDS CLASS ELSW 03/04/2010   Qualifier: Diagnosis of  By: Moshe Cipro MD, Joycelyn Schmid    . PTSD (post-traumatic stress disorder)   . SBO (small bowel obstruction) ([place]) 08/09/2013  . Seasonal allergies 12/10/2012  . Seizures ([place])   . Shortness of breath    BP 106/72   Pulse 78   Temp 98.2 F (36.8 C)   Ht 5\' 6"  (1.676 m)   Wt 183 lb 6.4 oz (83.2 kg)   SpO2 98%   BMI 29.60 kg/m   Opioid Risk Score:   Fall Risk Score:  `1  Depression screen PHQ 2/9  Depression screen [facility] 2/9 12/12/2020 11/14/2020 11/10/2020 11/01/2020 11/01/2020 10/05/2020 08/31/2020  Decreased Interest 3 1 0 0 0 0 0  Down, Depressed, Hopeless 1 1 0 1 1 0 0  PHQ - 2 Score 4 2 0 1 1 0 0  Altered sleeping 1 3 - - - - -  Tired, decreased energy 3 2 - - - - -  Change in appetite 3 1 - - - - -  Feeling bad or failure about yourself  2 0 - - - - -  Trouble concentrating 1 0 - - - - -  Moving slowly or fidgety/restless 1 0 - - - - -  Suicidal thoughts 0 0 - - - - -  PHQ-9 Score 15 8 - - - - -  Difficult doing work/chores Very difficult Somewhat difficult - - - - -  Some recent data might be hidden   Review of Systems  Constitutional: Negative.   HENT: Negative.   Eyes: Negative.   Respiratory: Positive for wheezing.   Cardiovascular: Negative.   Endocrine: Negative.   Genitourinary: Negative.  Musculoskeletal: Positive for back pain and gait problem.       Spasms  Allergic/Immunologic: Negative.   Neurological: Positive for weakness.       Tingling & numbness  Hematological: Negative.   Psychiatric/Behavioral: Negative.        Objective:   Physical Exam Gen: no distress, normal appearing HEENT: oral mucosa pink and moist, NCAT Cardio: Reg rate Chest: normal effort, normal rate of breathing Abd: soft, non-distended Ext: no edema Psych: pleasant, normal affect Skin: intact Neuro: Alert and oriented x3.  5/5 left lower extremity  strength.  4/5 right lower extremity strength- pain limited.  +Slump test on left for pain radiating in S1 nerve distribution.  Tenderness to palpation over right buttock.  +tenderness to palpation with palpable trigger points in cervical and thoracic myofascia No tenderness to palpation over her knees.  Psych: pleasant, normal affect     Assessment & Plan:  Mrs. Jane English is a 50 year old woman who follows for failed back syndrome, inactive spinal cord stimulator with adhesions, right buttock pain, bilateral knee pain, and myofascial pain.  R buttock pain: -Radiates in S1 distribution, associated with weakness on right side, +Slump test. -She has spinal cord stimulator which was a barrier to getting the MRI. She would like to get this removed. Discussed that she can phone [facility] as she is an established patient there. She provided me with phone number of [employer] rep with whom she has been communicating and I have left a message for her to see if we can get this removed for her.  -She has been able to manage this pain well.   Failed back syndrome: -Refilled Oxycodone 10mg  BID. She has been on this dose for many years and it has helped her cope with her pain and maintain her job. Continued to have no negative side effects. UDS obtained routinely and has expected metabolites.  -Continue Diclofenac gel -Recommended blue emu oil as well. Showed link of where she can get it online at [employer] as she was having trouble finding it at the store.  -Discussed [employer] PNS system as an option of pain treatment via neuromodulation. Provided following link for patient to learn more about the system: https://www.sprtherapeutics.com/.   -Discussed current symptoms of pain and history of pain.  -Discussed benefits of exercise in reducing pain. -Discussed following foods that may reduce pain: 1) Ginger 2) Blueberries 3) Salmon 4) Pumpkin seeds 5) dark chocolate 6) turmeric 7) tart cherries 8)  virgin olive oil 9) chilli peppers 10) mint  Link to further information on diet for chronic pain: http://www.randall.com/  Turmeric to reduce inflammation--can be used in cooking or taken as a supplement.  Benefits of turmeric:  -Highly anti-inflammatory  -Increases antioxidants  -Improves memory, attention, brain disease  -Lowers risk of heart disease  -May help prevent cancer  -Decreases pain  -Alleviates depression  -Delays aging and decreases risk of chronic disease  -Consume with black pepper to increase absorption    Turmeric Milk Recipe:  1 cup milk  1 tsp turmeric  1 tsp cinnamon  1 tsp grated ginger (optional)  Black pepper (boosts the anti-inflammatory properties of turmeric).  1 tsp honey  2) Myofascial pain syndrome: - trigger point injections next visit.     3) Bilateral knee OA; Her pain has improved with viscosupplementation- denies pain today!  4) Mood: Very positive. Very excited about her grandchildren. Very happy that her husband has returned.   5) Muscle cramps: Increase magnesium to 400mg   BID.   6) Plantar fasciitis: Bilateral, provided with home exercises, educated regarding typical treatments. Recommended rolling plantar aspect of feet on tennis balls. She has been doing this and symptoms have improved without boot.  7) Quality of life: She is enjoying work. Had a good Thanksgiving and plans to celebrate Christmas with her son in [place].   8) Bilateral hand OA: can try using diclofenac gel or blue emu oil here as well since it is effective and less expensive than the compounded cream.   RTC in 5 weeks for trigger point injections

## 2020-12-19 NOTE — Addendum Note (Signed)
Addended by: Jasmine December T on: 12/19/2020 10:06 AM   Modules accepted: Orders

## 2020-12-19 NOTE — Patient Instructions (Signed)
Chronic Pain Syndrome secondary to___ -Discussed current symptoms of pain and history of pain.  -Discussed benefits of exercise in reducing pain. -Discussed following foods that may reduce pain: 1) Ginger 2) Blueberries 3) Salmon 4) Pumpkin seeds 5) dark chocolate 6) turmeric 7) tart cherries 8) virgin olive oil 9) chilli peppers 10) mint  Link to further information on diet for chronic pain: http://www.randall.com/   Turmeric to reduce inflammation--can be used in cooking or taken as a supplement.  Benefits of turmeric:  -Highly anti-inflammatory  -Increases antioxidants  -Improves memory, attention, brain disease  -Lowers risk of heart disease  -May help prevent cancer  -Decreases pain  -Alleviates depression  -Delays aging and decreases risk of chronic disease  -Consume with black pepper to increase absorption    Turmeric Milk Recipe:  1 cup milk  1 tsp turmeric  1 tsp cinnamon  1 tsp grated ginger (optional)  Black pepper (boosts the anti-inflammatory properties of turmeric).  1 tsp honey

## 2020-12-24 ENCOUNTER — Other Ambulatory Visit: Payer: Self-pay | Admitting: Physical Medicine and Rehabilitation

## 2020-12-27 ENCOUNTER — Other Ambulatory Visit: Payer: Self-pay | Admitting: Family Medicine

## 2020-12-29 ENCOUNTER — Encounter: Payer: Self-pay | Admitting: Family Medicine

## 2021-01-02 ENCOUNTER — Other Ambulatory Visit: Payer: Self-pay | Admitting: Physical Medicine and Rehabilitation

## 2021-01-02 DIAGNOSIS — M94262 Chondromalacia, left knee: Secondary | ICD-10-CM

## 2021-01-02 MED ORDER — MELOXICAM 15 MG PO TABS: 15.0000 mg | ORAL_TABLET | Freq: Every day | ORAL | 0 refills | Status: AC

## 2021-01-04 ENCOUNTER — Other Ambulatory Visit: Payer: Self-pay | Admitting: Physical Medicine and Rehabilitation

## 2021-01-05 NOTE — Progress Notes (Deleted)
Office Visit Note  Patient: Jane English             Date of Birth: Aug 16, 1971           MRN: [id number]             PCP: Fayrene Helper, MD Referring: Fayrene Helper, MD Visit Date: 01/18/2021 Occupation: @GUAROCC @  Subjective:  No chief complaint on file.   History of Present Illness: Jane English is a 50 y.o. female ***   Activities of Daily Living:  Patient reports morning stiffness for *** {minute/hour:19697}.   Patient {ACTIONS;DENIES/REPORTS:21021675::"Denies"} nocturnal pain.  Difficulty dressing/grooming: {ACTIONS;DENIES/REPORTS:21021675::"Denies"} Difficulty climbing stairs: {ACTIONS;DENIES/REPORTS:21021675::"Denies"} Difficulty getting out of chair: {ACTIONS;DENIES/REPORTS:21021675::"Denies"} Difficulty using hands for taps, buttons, cutlery, and/or writing: {ACTIONS;DENIES/REPORTS:21021675::"Denies"}  No Rheumatology ROS completed.   PMFS History:  Patient Active Problem List   Diagnosis Date Noted  . Anxiety 12/16/2020  . Pap smear for cervical cancer screening 08/11/2020  . Left foot pain 05/01/2020  . Great toe pain, left 05/01/2020  . Back spasm 04/10/2020  . Not well controlled severe persistent asthma 02/03/2020  . Pruritic rash 02/03/2020  . Urinary urgency 08/11/2019  . Overweight (BMI 25.0-29.9) 04/26/2019  . GAD (generalized anxiety disorder) 04/26/2019  . Arthralgia of left shoulder region 11/05/2018  . Depression, major, single episode, severe ([place]) 10/03/2018  . Vitamin D deficiency 12/14/2017  . Bilateral ankle pain 04/16/2017  . Amenorrhea 10/15/2016  . Paresthesia 05/06/2016  . Back pain with radiation 04/04/2016  . Obesity (BMI 30.0-34.9) 03/25/2016  . Neuropathic pain of finger of right hand 12/31/2015  . Pap smear of cervix shows high risk HPV present 08/08/2015  . Primary osteoarthritis of right knee 03/17/2015  . Cyst of left ovary 02/06/2015  . Anovulation 02/06/2015  . Secondary amenorrhea 02/06/2015  . Intractable  chronic migraine without aura and without status migrainosus 12/21/2014  . Migraine 09/21/2014  . Heart murmur 05/13/2014  . Abdominal adhesions 02/15/2014  . Anemia 10/11/2013  . H. pylori duodenitis 07/07/2013  . PTSD (post-traumatic stress disorder) 03/26/2013  . Insomnia 03/18/2013  . Seasonal and perennial allergic rhinoconjunctivitis 12/10/2012  . GERD (gastroesophageal reflux disease) 10/29/2012  . Chronic pain syndrome 10/06/2012  . Postlaminectomy syndrome 10/06/2012  . Cervical neck pain with evidence of disc disease 04/13/2012  . Allergic rhinitis 04/17/2010  . Cough variant asthma 03/04/2010  . Prediabetes 01/05/2009  . Essential hypertension 11/27/2007    Past Medical History:  Diagnosis Date  . Anemia   . Asthma   . Asthma flare 04/09/2013  . Back pain   . Bronchitis   . Chronic abdominal pain   . Chronic constipation   . Constipation due to opioid therapy   . Depression   . Depression, major, single episode, severe ([place]) 10/03/2018   PHQ 9 score of 15  . Diabetes mellitus without complication ([place])   . Diabetes mellitus, type II ([place])   . DVT (deep venous thrombosis) ([place]) 2010  . GERD (gastroesophageal reflux disease)   . Heart murmur    no cardiologist  . Helicobacter pylori gastritis 06/11/2013   Colonoscopy Dr. Hilarie Fredrickson  . Hypertension   . IBS (irritable bowel syndrome)   . Migraine headache   . Neuropathy   . Obesity   . Obsessive-compulsive disorder   . PSYCHOTIC D/O W/HALLUCINATIONS CONDS CLASS ELSW 03/04/2010   Qualifier: Diagnosis of  By: Moshe Cipro MD, Joycelyn Schmid    . PTSD (post-traumatic stress disorder)   . SBO (small bowel obstruction) ([place]) 08/09/2013  .  Seasonal allergies 12/10/2012  . Seizures ([place])   . Shortness of breath     Family History  Problem Relation Age of Onset  . Lung cancer Father   . Stomach cancer Father   . Esophageal cancer Father   . Alcohol abuse Father   . Mental illness Father   . Diabetes Sister   . Hypertension Sister    . Bipolar disorder Sister   . Schizophrenia Sister   . Diabetes Sister   . Alcohol abuse Brother   . Hypertension Brother   . Kidney disease Brother   . Diabetes Brother   . Drug abuse Brother   . Mental illness Brother   . Alcohol abuse Brother   . Alcohol abuse Brother   . Hypertension Brother   . Diabetes Brother   . Alcohol abuse Brother   . Physical abuse Mother   . Alcohol abuse Mother   . Cirrhosis Mother   . Mental illness Brother        in [place]  . Drug abuse Sister   . HIV Sister   . Alcohol abuse Brother   . Pneumonia Sister        died as a baby  . Alcohol abuse Brother   . Bipolar disorder Brother   . Bipolar disorder Daughter   . Bipolar disorder Son   . Bipolar disorder Son   . Hypertension Brother   . Bipolar disorder Brother   . Drug abuse Brother   . Alcohol abuse Brother   . Bipolar disorder Brother   . ADD / ADHD Neg Hx   . Anxiety disorder Neg Hx   . Dementia Neg Hx   . Depression Neg Hx   . OCD Neg Hx   . Seizures Neg Hx   . Paranoid behavior Neg Hx   . Colon cancer Neg Hx    Past Surgical History:  Procedure Laterality Date  . ANTERIOR CERVICAL DECOMP/DISCECTOMY FUSION  07/07/2012   Procedure: ANTERIOR CERVICAL DECOMPRESSION/DISCECTOMY FUSION 2 LEVELS;  Surgeon: Floyce Stakes, MD;  Location: [facility];  Service: Neurosurgery;  Laterality: N/A;  Cervical four-five, five - six  Anterior cervical decompression/diskectomy/fusion/plate  . APPENDECTOMY  1986  . BOWEL RESECTION N/A 07/29/2013   Procedure: serosal repair;  Surgeon: Adin Hector, MD;  Location: [facility];  Service: General;  Laterality: N/A;  . CARPAL TUNNEL RELEASE Bilateral   . COLON SURGERY N/A    Phreesia 07/29/2020  . LAPAROSCOPY N/A 07/29/2013   Procedure: diagnostic laporoscopy;  Surgeon: Adin Hector, MD;  Location: [facility];  Service: General;  Laterality: N/A;  . LAPAROSCOPY N/A 08/16/2013   Procedure: LAPAROSCOPY DIAGNOSTIC/LYSIS OF ADHESIONS;  Surgeon: Adin Hector, MD;  Location: [facility];  Service: General;  Laterality: N/A;  . LAPAROTOMY N/A 08/16/2013   Procedure: EXPLORATORY LAPAROTOMY/SMALL BOWEL RESECTION (JEJUNUM);  Surgeon: Adin Hector, MD;  Location: [facility];  Service: General;  Laterality: N/A;  . LUMBAR SPINE SURGERY  2010   x 3  . LYSIS OF ADHESION  2003   Dr. Irving Shows  . LYSIS OF ADHESION N/A 07/29/2013   Procedure: LYSIS OF ADHESION;  Surgeon: Adin Hector, MD;  Location: [facility];  Service: General;  Laterality: N/A;  . OOPHORECTOMY    . PARTIAL HYSTERECTOMY  1990s?   , [place]  . SPINAL CORD STIMULATOR IMPLANT    . SPINE SURGERY N/A    Phreesia 07/29/2020  . TRIGGER FINGER RELEASE  2009   right  pinkie finger  . TUBAL LIGATION  1994   Social History   Social History Narrative  . Not on file   Immunization History  Administered Date(s) Administered  . Influenza Split 09/03/2012  . Influenza Whole 07/28/2007, 08/11/2009, 07/19/2010  . Influenza,inj,Quad PF,6+ Mos 09/13/2013, 08/12/2014, 08/01/2015, 07/24/2016, 08/14/2017, 08/12/2018, 08/10/2019, 07/11/2020  . Influenza-Unspecified 05/24/2018  . Moderna Sars-Covid-2 Vaccination 07/21/2020, 08/14/2020  . PPD Test 02/22/2011, 06/26/2011, 09/18/2012, 11/29/2013, 12/06/2014, 12/09/2017  . Td 10/03/2005  . Tdap 12/09/2017     Objective: Vital Signs: There were no vitals taken for this visit.   Physical Exam   Musculoskeletal Exam: ***  CDAI Exam: CDAI Score: -- Patient Global: --; Provider Global: -- Swollen: --; Tender: -- Joint Exam 01/18/2021   No joint exam has been documented for this visit   There is currently no information documented on the homunculus. Go to the Rheumatology activity and complete the homunculus joint exam.  Investigation: No additional findings.  Imaging: No results found.  Recent Labs: Lab Results  Component Value Date   WBC 7.2 04/06/2020   HGB 13.3 04/06/2020   PLT 313 04/06/2020   NA 141 04/06/2020   K 3.8  04/06/2020   CL 105 04/06/2020   CO2 21 04/06/2020   GLUCOSE 108 (H) 04/06/2020   BUN 13 04/06/2020   CREATININE 1.05 (H) 04/06/2020   BILITOT 0.2 04/06/2020   ALKPHOS 77 04/06/2020   AST 13 04/06/2020   ALT 18 04/06/2020   PROT 7.4 04/06/2020   ALBUMIN 4.5 04/06/2020   CALCIUM 9.2 04/06/2020   GFRAA 71 04/06/2020    Speciality Comments: No specialty comments available.  Procedures:  No procedures performed Allergies: Neurontin [gabapentin], Penicillins, Pregabalin, Tramadol, and Latex   Assessment / Plan:     Visit Diagnoses: No diagnosis found.  Orders: No orders of the defined types were placed in this encounter.  No orders of the defined types were placed in this encounter.   Face-to-face time spent with patient was *** minutes. Greater than 50% of time was spent in counseling and coordination of care.  Follow-Up Instructions: No follow-ups on file.   Earnestine Mealing, CMA  Note - This record has been created using Editor, commissioning.  Chart creation errors have been sought, but may not always  have been located. Such creation errors do not reflect on  the standard of medical care.

## 2021-01-08 ENCOUNTER — Encounter: Payer: Self-pay | Admitting: Family Medicine

## 2021-01-08 ENCOUNTER — Telehealth (INDEPENDENT_AMBULATORY_CARE_PROVIDER_SITE_OTHER): Payer: Medicare HMO | Admitting: Family Medicine

## 2021-01-08 ENCOUNTER — Other Ambulatory Visit: Payer: Self-pay

## 2021-01-08 VITALS — Ht 66.0 in | Wt 189.0 lb

## 2021-01-08 DIAGNOSIS — R7301 Impaired fasting glucose: Secondary | ICD-10-CM

## 2021-01-08 DIAGNOSIS — I10 Essential (primary) hypertension: Secondary | ICD-10-CM | POA: Diagnosis not present

## 2021-01-08 DIAGNOSIS — H1010 Acute atopic conjunctivitis, unspecified eye: Secondary | ICD-10-CM

## 2021-01-08 DIAGNOSIS — E669 Obesity, unspecified: Secondary | ICD-10-CM | POA: Diagnosis not present

## 2021-01-08 DIAGNOSIS — J302 Other seasonal allergic rhinitis: Secondary | ICD-10-CM | POA: Diagnosis not present

## 2021-01-08 DIAGNOSIS — J3089 Other allergic rhinitis: Secondary | ICD-10-CM

## 2021-01-08 DIAGNOSIS — R7303 Prediabetes: Secondary | ICD-10-CM

## 2021-01-08 DIAGNOSIS — E559 Vitamin D deficiency, unspecified: Secondary | ICD-10-CM

## 2021-01-08 DIAGNOSIS — F322 Major depressive disorder, single episode, severe without psychotic features: Secondary | ICD-10-CM

## 2021-01-08 DIAGNOSIS — Z1159 Encounter for screening for other viral diseases: Secondary | ICD-10-CM

## 2021-01-08 DIAGNOSIS — Z1231 Encounter for screening mammogram for malignant neoplasm of breast: Secondary | ICD-10-CM

## 2021-01-08 MED ORDER — FLUOXETINE HCL 40 MG PO CAPS: 40.0000 mg | ORAL_CAPSULE | Freq: Every day | ORAL | 3 refills | Status: DC

## 2021-01-08 NOTE — Patient Instructions (Addendum)
F/U in 8 to 10 weeks, call if you need me sooner  New higher dose of fluoxetine is 40 mg one daily, continue other medications as before  Start walking for 40 minutes every day , take your dogs with you!  Mammogram is past due, please schedule at checkout  Please get fasting lipid, cmp and EGFr, TSH, HBa1C and vit D and hepatitis C screen and CBC  tomorrow morning   Thanks for choosing [facility], we consider it a privelige to serve you.

## 2021-01-11 ENCOUNTER — Ambulatory Visit (HOSPITAL_COMMUNITY)
Admission: RE | Admit: 2021-01-11 | Discharge: 2021-01-11 | Disposition: A | Discharge status: Home / Self Care | Payer: Medicare HMO | Source: Ambulatory Visit | Attending: Physical Medicine and Rehabilitation | Admitting: Physical Medicine and Rehabilitation

## 2021-01-11 ENCOUNTER — Other Ambulatory Visit: Payer: Self-pay

## 2021-01-11 DIAGNOSIS — M94262 Chondromalacia, left knee: Secondary | ICD-10-CM | POA: Insufficient documentation

## 2021-01-11 DIAGNOSIS — M25562 Pain in left knee: Secondary | ICD-10-CM | POA: Diagnosis not present

## 2021-01-15 ENCOUNTER — Other Ambulatory Visit: Payer: Self-pay | Admitting: Physical Medicine and Rehabilitation

## 2021-01-15 DIAGNOSIS — S83207A Unspecified tear of unspecified meniscus, current injury, left knee, initial encounter: Secondary | ICD-10-CM

## 2021-01-15 MED ORDER — METHYLPREDNISOLONE 4 MG PO TBPK: ORAL_TABLET | ORAL | 0 refills | Status: DC

## 2021-01-16 ENCOUNTER — Ambulatory Visit: Payer: Medicare HMO | Admitting: Family Medicine

## 2021-01-18 ENCOUNTER — Ambulatory Visit: Payer: Medicare HMO | Admitting: Rheumatology

## 2021-01-18 DIAGNOSIS — M5136 Other intervertebral disc degeneration, lumbar region: Secondary | ICD-10-CM

## 2021-01-18 DIAGNOSIS — Z8719 Personal history of other diseases of the digestive system: Secondary | ICD-10-CM

## 2021-01-18 DIAGNOSIS — G894 Chronic pain syndrome: Secondary | ICD-10-CM

## 2021-01-18 DIAGNOSIS — M2241 Chondromalacia patellae, right knee: Secondary | ICD-10-CM

## 2021-01-18 DIAGNOSIS — F32A Depression, unspecified: Secondary | ICD-10-CM

## 2021-01-18 DIAGNOSIS — M5030 Other cervical disc degeneration, unspecified cervical region: Secondary | ICD-10-CM

## 2021-01-18 DIAGNOSIS — Z8639 Personal history of other endocrine, nutritional and metabolic disease: Secondary | ICD-10-CM

## 2021-01-18 DIAGNOSIS — M19042 Primary osteoarthritis, left hand: Secondary | ICD-10-CM

## 2021-01-18 DIAGNOSIS — E559 Vitamin D deficiency, unspecified: Secondary | ICD-10-CM

## 2021-01-18 DIAGNOSIS — Z86718 Personal history of other venous thrombosis and embolism: Secondary | ICD-10-CM

## 2021-01-18 DIAGNOSIS — M2550 Pain in unspecified joint: Secondary | ICD-10-CM

## 2021-01-18 DIAGNOSIS — M7711 Lateral epicondylitis, right elbow: Secondary | ICD-10-CM

## 2021-01-18 DIAGNOSIS — I10 Essential (primary) hypertension: Secondary | ICD-10-CM

## 2021-01-19 ENCOUNTER — Encounter: Payer: Self-pay | Admitting: Family Medicine

## 2021-01-19 ENCOUNTER — Other Ambulatory Visit: Payer: Self-pay | Admitting: Family Medicine

## 2021-01-19 MED ORDER — PHENTERMINE HCL 37.5 MG PO TABS: ORAL_TABLET | ORAL | 1 refills | Status: DC

## 2021-01-19 NOTE — Assessment & Plan Note (Signed)
Patient educated about the importance of limiting  Carbohydrate intake , the need to commit to daily physical activity for a minimum of 30 minutes , and to commit weight loss. The fact that changes in all these areas will reduce or eliminate all together the development of diabetes is stressed.  Updated lab needed at/ before next visit.   Diabetic Labs Latest Ref Rng & Units 04/06/2020 01/04/2020 12/06/2019 04/21/2019 02/04/2019  HbA1c 4.8 - 5.6 % 6.3(H) 6.4(H) - 5.8(H) -  Microalbumin 0.00 - 1.89 mg/dL - - - - -  Micro/Creat Ratio 0.0 - 30.0 mg/g - - - - -  Chol <200 mg/dL - - 195 183 -  HDL > OR = 50 mg/dL - - 55 56 -  Calc LDL mg/dL (calc) - - 117(H) 109(H) -  Triglycerides <150 mg/dL - - 124 86 -  Creatinine 0.57 - 1.00 mg/dL 1.05(H) - 1.03 1.00 1.06(H)  GFR >60.00 mL/min - - - - -   BP/Weight 01/08/2021 12/19/2020 12/12/2020 11/14/2020 11/10/2020 10/05/2020 82/57/4935  Systolic BP - 521 - 747 159 539 672  Diastolic BP - 72 - 75 75 76 71  Wt. (Lbs) 189 183.4 189.1 189 185.8 183 183.6  BMI 30.51 29.6 30.52 30.51 29.99 29.54 29.63  Some encounter information is confidential and restricted. Go to Review Flowsheets activity to see all data.   No flowsheet data found.

## 2021-01-19 NOTE — Progress Notes (Signed)
Virtual Visit via Telephone Note  I connected with [place] on 01/19/21 at  2:40 PM EST by telephone and verified that I am speaking with the correct person using two identifiers.  Location: Patient: home Provider: office   I discussed the limitations, risks, security and privacy concerns of performing an evaluation and management service by telephone and the availability of in person appointments. I also discussed with the patient that there may be a patient responsible charge related to this service. The patient expressed understanding and agreed to proceed.   History of Present Illness: F/U chronic problems and address any new or current concerns. Review and update medications and allergies. Review recent lab and radiologic data . Update routine health maintainace. Review an encourage improved health habits to include nutrition, exercise and  sleep . 2 week h/o left knee pain following fall, being treated by pain management reportedly  Denies recent fever or chills. Denies sinus pressure, nasal congestion, ear pain or sore throat. Denies chest congestion, productive cough or wheezing. Denies chest pains, palpitations and leg swelling Denies abdominal pain, nausea, vomiting,diarrhea or constipation.   Denies dysuria, frequency, hesitancy or incontinence. C/o  joint pain, swelling and limitation in mobility. Denies headaches or seizures C/o  depression, anxiety and  Insomnia.Not suicidal or homicidal Denies skin break down or rash.       Observations/Objective:  Ht 5\' 6"  (1.676 m)   Wt 189 lb (85.7 kg)   BMI 30.51 kg/m  Good communication with no confusion and intact memory. Alert and oriented x 3 No signs of respiratory distress during speech   Assessment and Plan: Depression, major, single episode, severe (HCC) Increase fluoxetine to 40 mg daily and start regular exercise  Obesity (BMI 30.0-34.9)  Patient re-educated about  the importance of commitment to a   minimum of 150 minutes of exercise per week as able.  The importance of healthy food choices with portion control discussed, as well as eating regularly and within a 12 hour window most days. The need to choose "clean , green" food 50 to 75% of the time is discussed, as well as to make water the primary drink and set a goal of 64 ounces water daily.    Weight /BMI 01/08/2021 12/19/2020 12/12/2020  WEIGHT 189 lb 183 lb 6.4 oz 189 lb 1.6 oz  HEIGHT 5\' 6"  5\' 6"  5\' 6"   BMI 30.51 kg/m2 29.6 kg/m2 30.52 kg/m2  Some encounter information is confidential and restricted. Go to Review Flowsheets activity to see all data.      Prediabetes Patient educated about the importance of limiting  Carbohydrate intake , the need to commit to daily physical activity for a minimum of 30 minutes , and to commit weight loss. The fact that changes in all these areas will reduce or eliminate all together the development of diabetes is stressed.  Updated lab needed at/ before next visit.   Diabetic Labs Latest Ref Rng & Units 04/06/2020 01/04/2020 12/06/2019 04/21/2019 02/04/2019  HbA1c 4.8 - 5.6 % 6.3(H) 6.4(H) - 5.8(H) -  Microalbumin 0.00 - 1.89 mg/dL - - - - -  Micro/Creat Ratio 0.0 - 30.0 mg/g - - - - -  Chol <200 mg/dL - - 195 183 -  HDL > OR = 50 mg/dL - - 55 56 -  Calc LDL mg/dL (calc) - - 117(H) 109(H) -  Triglycerides <150 mg/dL - - 124 86 -  Creatinine 0.57 - 1.00 mg/dL 1.05(H) - 1.03 1.00 1.06(H)  GFR >60.00  mL/min - - - - -   BP/Weight 01/08/2021 12/19/2020 12/12/2020 11/14/2020 11/10/2020 10/05/2020 44/51/4604  Systolic BP - 799 - 872 158 727 618  Diastolic BP - 72 - 75 75 76 71  Wt. (Lbs) 189 183.4 189.1 189 185.8 183 183.6  BMI 30.51 29.6 30.52 30.51 29.99 29.54 29.63  Some encounter information is confidential and restricted. Go to Review Flowsheets activity to see all data.   No flowsheet data found.    Seasonal and perennial allergic rhinoconjunctivitis Anticipates flare as Spring approaches, continue  daily maintainace meds    Follow Up Instructions:    I discussed the assessment and treatment plan with the patient. The patient was provided an opportunity to ask questions and all were answered. The patient agreed with the plan and demonstrated an understanding of the instructions.   The patient was advised to call back or seek an in-person evaluation if the symptoms worsen or if the condition fails to improve as anticipated.  I provided 17 minutes of non-face-to-face time during this encounter.   Tula Nakayama, MD

## 2021-01-19 NOTE — Assessment & Plan Note (Signed)
Anticipates flare as Spring approaches, continue daily maintainace meds

## 2021-01-19 NOTE — Assessment & Plan Note (Signed)
  Patient re-educated about  the importance of commitment to a  minimum of 150 minutes of exercise per week as able.  The importance of healthy food choices with portion control discussed, as well as eating regularly and within a 12 hour window most days. The need to choose "clean , green" food 50 to 75% of the time is discussed, as well as to make water the primary drink and set a goal of 64 ounces water daily.    Weight /BMI 01/08/2021 12/19/2020 12/12/2020  WEIGHT 189 lb 183 lb 6.4 oz 189 lb 1.6 oz  HEIGHT 5\' 6"  5\' 6"  5\' 6"   BMI 30.51 kg/m2 29.6 kg/m2 30.52 kg/m2  Some encounter information is confidential and restricted. Go to Review Flowsheets activity to see all data.

## 2021-01-19 NOTE — Assessment & Plan Note (Addendum)
Increase fluoxetine to 40 mg daily and start regular exercise

## 2021-01-22 ENCOUNTER — Ambulatory Visit (HOSPITAL_COMMUNITY)
Admission: RE | Admit: 2021-01-22 | Discharge: 2021-01-22 | Disposition: A | Discharge status: Home / Self Care | Payer: Medicare HMO | Source: Ambulatory Visit | Attending: Family Medicine | Admitting: Family Medicine

## 2021-01-22 ENCOUNTER — Other Ambulatory Visit: Payer: Self-pay

## 2021-01-22 DIAGNOSIS — Z1231 Encounter for screening mammogram for malignant neoplasm of breast: Secondary | ICD-10-CM | POA: Insufficient documentation

## 2021-01-23 ENCOUNTER — Telehealth: Payer: Self-pay | Admitting: *Deleted

## 2021-01-23 ENCOUNTER — Other Ambulatory Visit: Payer: Self-pay | Admitting: Physical Medicine and Rehabilitation

## 2021-01-23 ENCOUNTER — Other Ambulatory Visit: Payer: Self-pay | Admitting: Family Medicine

## 2021-01-23 DIAGNOSIS — S83207A Unspecified tear of unspecified meniscus, current injury, left knee, initial encounter: Secondary | ICD-10-CM

## 2021-01-23 NOTE — Telephone Encounter (Signed)
I will order CT instead!

## 2021-01-23 NOTE — Telephone Encounter (Signed)
[name] from [facility] radiology called about the order for Jane English MRI of her knee and and old order from last year for an MRI of her back ordered by Dr Ranell Patrick.  There is a problem because Jane Eckhart has a non working nerve stimulator and therefore it is unsafe to do the MRI.  Dr Ranell Patrick may want to order a CT scan instead or contact the radiologists who approve or deny these procedures.  Their number is [phone].

## 2021-01-23 NOTE — Telephone Encounter (Signed)
Ms Cibrian notified.

## 2021-01-24 ENCOUNTER — Other Ambulatory Visit: Payer: Self-pay | Admitting: Physical Medicine and Rehabilitation

## 2021-01-26 ENCOUNTER — Other Ambulatory Visit: Payer: Self-pay

## 2021-01-26 ENCOUNTER — Encounter: Payer: Medicare HMO | Attending: Physical Medicine and Rehabilitation | Admitting: Physical Medicine and Rehabilitation

## 2021-01-26 ENCOUNTER — Encounter: Payer: Self-pay | Admitting: Physical Medicine and Rehabilitation

## 2021-01-26 VITALS — BP 129/76 | HR 88 | Temp 98.2°F | Ht 66.0 in | Wt 188.2 lb

## 2021-01-26 DIAGNOSIS — Z79891 Long term (current) use of opiate analgesic: Secondary | ICD-10-CM | POA: Insufficient documentation

## 2021-01-26 DIAGNOSIS — Z5181 Encounter for therapeutic drug level monitoring: Secondary | ICD-10-CM | POA: Diagnosis not present

## 2021-01-26 DIAGNOSIS — G894 Chronic pain syndrome: Secondary | ICD-10-CM | POA: Diagnosis not present

## 2021-01-26 MED ORDER — OXYCODONE HCL 5 MG PO TABS: 10.0000 mg | ORAL_TABLET | Freq: Two times a day (BID) | ORAL | 0 refills | Status: DC

## 2021-01-26 NOTE — Progress Notes (Signed)
Trigger Point Injection  Indication: Cervical myofascial pain not relieved by medication management and other conservative care.  Informed consent was obtained after describing risk and benefits of the procedure with the patient, this includes bleeding, bruising, infection and medication side effects.  The patient wishes to proceed and has given written consent.  The patient was placed in a seated position.  The cervical area was marked and prepped with Betadine.  It was entered with a 25-gauge 1/2 inch needle and a total of 5 mL of 1% lidocaine and normal saline was injected into a total of 4 trigger points, after negative draw back for blood.  The patient tolerated the procedure well.  Post procedure instructions were given. 

## 2021-01-29 ENCOUNTER — Other Ambulatory Visit: Payer: Self-pay | Admitting: Family Medicine

## 2021-01-30 ENCOUNTER — Telehealth: Payer: Self-pay

## 2021-01-30 ENCOUNTER — Other Ambulatory Visit: Payer: Self-pay | Admitting: Internal Medicine

## 2021-01-30 DIAGNOSIS — F411 Generalized anxiety disorder: Secondary | ICD-10-CM

## 2021-01-30 MED ORDER — ALPRAZOLAM 0.25 MG PO TABS
0.2500 mg | ORAL_TABLET | Freq: Every day | ORAL | 1 refills | Status: DC
Start: 2021-01-30 — End: 2021-05-14

## 2021-01-30 NOTE — Telephone Encounter (Signed)
Refills sent

## 2021-01-30 NOTE — Telephone Encounter (Signed)
Requesting refill of alprazolam

## 2021-01-31 LAB — TOXASSURE SELECT,+ANTIDEPR,UR

## 2021-02-09 ENCOUNTER — Telehealth: Payer: Self-pay | Admitting: *Deleted

## 2021-02-09 NOTE — Telephone Encounter (Signed)
Urine drug screen for this encounter is consistent for prescribed medication 

## 2021-02-21 ENCOUNTER — Encounter: Payer: Self-pay | Admitting: Family Medicine

## 2021-02-21 ENCOUNTER — Other Ambulatory Visit: Payer: Self-pay

## 2021-02-21 ENCOUNTER — Telehealth (INDEPENDENT_AMBULATORY_CARE_PROVIDER_SITE_OTHER): Payer: Medicare HMO | Admitting: Family Medicine

## 2021-02-21 DIAGNOSIS — J209 Acute bronchitis, unspecified: Secondary | ICD-10-CM | POA: Insufficient documentation

## 2021-02-21 DIAGNOSIS — J440 Chronic obstructive pulmonary disease with acute lower respiratory infection: Secondary | ICD-10-CM | POA: Insufficient documentation

## 2021-02-21 DIAGNOSIS — J019 Acute sinusitis, unspecified: Secondary | ICD-10-CM | POA: Diagnosis not present

## 2021-02-21 MED ORDER — FLUCONAZOLE 150 MG PO TABS: 150.0000 mg | ORAL_TABLET | Freq: Once | ORAL | 0 refills | Status: AC

## 2021-02-21 MED ORDER — BENZONATATE 100 MG PO CAPS: 100.0000 mg | ORAL_CAPSULE | Freq: Two times a day (BID) | ORAL | 0 refills | Status: DC | PRN

## 2021-02-21 MED ORDER — SULFAMETHOXAZOLE-TRIMETHOPRIM 800-160 MG PO TABS: 1.0000 | ORAL_TABLET | Freq: Two times a day (BID) | ORAL | 0 refills | Status: DC

## 2021-02-21 NOTE — Assessment & Plan Note (Signed)
Septra and fluconazole will be prescribed

## 2021-02-21 NOTE — Progress Notes (Signed)
Virtual Visit via Telephone Note  I connected with [place] on 02/21/21 at  8:20 AM EDT by telephone and verified that I am speaking with the correct person using two identifiers.  Location: Patient: away from home, [place] Provider: office   I discussed the limitations, risks, security and privacy concerns of performing an evaluation and management service by telephone and the availability of in person appointments. I also discussed with the patient that there may be a patient responsible charge related to this service. The patient expressed understanding and agreed to proceed.   History of Present Illness: 1 week h/o worsening head and chest congestion, associated with fever and chills intermittently. Nasal drainage has thickened , and is yellowish green, and at times bloody. Sputum is thick and yellow. C/o bilateral ear pressure, denies hearing loss and sore throat. Increasing fatigue , poor appetitie and sleep disturbed by cough. No improvement with OTC medication.    Observations/Objective: There were no vitals taken for this visit.  Good communication with no confusion and intact memory. Alert and oriented x 3 No signs of respiratory distress during speech    Assessment and Plan: Sinusitis Septra and fluconazole will be prescribed  Acute bronchitis with COPD ([place]) Septra and tessalon perle    Follow Up Instructions:    I discussed the assessment and treatment plan with the patient. The patient was provided an opportunity to ask questions and all were answered. The patient agreed with the plan and demonstrated an understanding of the instructions.   The patient was advised to call back or seek an in-person evaluation if the symptoms worsen or if the condition fails to improve as anticipated.  I provided 12 minutes of non-face-to-face time during this encounter.   Tula Nakayama, MD

## 2021-02-21 NOTE — Patient Instructions (Signed)
F/u as before, call if you need me sooner.  Medications, 3, are prescribed for acue sinusitis and bronchitis  Thanks for choosing [facility], we consider it a privelige to serve you.

## 2021-02-21 NOTE — Assessment & Plan Note (Signed)
Septra and tessalon perle

## 2021-02-26 ENCOUNTER — Ambulatory Visit (HOSPITAL_COMMUNITY)
Admission: RE | Admit: 2021-02-26 | Discharge: 2021-02-26 | Disposition: A | Discharge status: Home / Self Care | Payer: Medicare HMO | Source: Ambulatory Visit | Attending: Physical Medicine and Rehabilitation | Admitting: Physical Medicine and Rehabilitation

## 2021-02-26 ENCOUNTER — Telehealth: Payer: Self-pay | Admitting: *Deleted

## 2021-02-26 ENCOUNTER — Encounter (HOSPITAL_COMMUNITY): Payer: Self-pay

## 2021-02-26 ENCOUNTER — Other Ambulatory Visit: Payer: Self-pay

## 2021-02-26 ENCOUNTER — Other Ambulatory Visit: Payer: Self-pay | Admitting: Physical Medicine and Rehabilitation

## 2021-02-26 DIAGNOSIS — S83207A Unspecified tear of unspecified meniscus, current injury, left knee, initial encounter: Secondary | ICD-10-CM

## 2021-02-26 NOTE — Telephone Encounter (Signed)
Judeen Hammans from [facility] called because Ms Keenum was there for CT and radiologist says she needs CT arthrogram.  I called back and it will need a prior auth as well. Orders need to be changed.

## 2021-03-05 ENCOUNTER — Other Ambulatory Visit: Payer: Self-pay

## 2021-03-05 ENCOUNTER — Ambulatory Visit (HOSPITAL_COMMUNITY)
Admission: RE | Admit: 2021-03-05 | Discharge: 2021-03-05 | Disposition: A | Discharge status: Home / Self Care | Payer: Medicare HMO | Source: Ambulatory Visit | Attending: Physical Medicine and Rehabilitation | Admitting: Physical Medicine and Rehabilitation

## 2021-03-05 ENCOUNTER — Ambulatory Visit (HOSPITAL_COMMUNITY): Payer: Medicare HMO

## 2021-03-05 DIAGNOSIS — M25362 Other instability, left knee: Secondary | ICD-10-CM | POA: Diagnosis not present

## 2021-03-05 DIAGNOSIS — M1712 Unilateral primary osteoarthritis, left knee: Secondary | ICD-10-CM | POA: Diagnosis not present

## 2021-03-05 DIAGNOSIS — S83207A Unspecified tear of unspecified meniscus, current injury, left knee, initial encounter: Secondary | ICD-10-CM

## 2021-03-05 MED ORDER — SODIUM CHLORIDE (PF) 0.9 % IJ SOLN
INTRAMUSCULAR | Status: AC
  Filled 2021-03-05: qty 10

## 2021-03-05 MED ORDER — LIDOCAINE HCL 1 % IJ SOLN
INTRAMUSCULAR | Status: AC
  Administered 2021-03-05: 10 mL via INTRAMUSCULAR
  Filled 2021-03-05: qty 20

## 2021-03-05 MED ORDER — SODIUM CHLORIDE (PF) 0.9 % IJ SOLN
INTRAMUSCULAR | Status: AC
  Administered 2021-03-05: 10 mL via INTRAMUSCULAR
  Filled 2021-03-05: qty 10

## 2021-03-05 NOTE — Procedures (Signed)
INDICATION: Knee instability, knee pain after a fall. EXAM: CONTRAST INJECTION OF LEFT KNEE JOINT UNDER FLUOROSCOPIC GUIDANCE, PRE-CT COMPARISON: Multiple exams, including radiographs from 01/11/2021 FLUOROSCOPY TIME: Fluoroscopy Time:  0 minutes, 18 seconds Radiation Exposure Index (if provided by the fluoroscopic device):  0.5 mGy Number of Acquired Spot Images: 0 COMPLICATIONS: None immediate. PROCEDURE:   I discussed the risks (including hemorrhage, infection, and allergic reaction, among others), benefits, and alternatives to the procedure with the patient.  We specifically discussed the high technical likelihood of success of the procedure.  The patient understood and elected to undergo the procedure.    Standard time-out was employed.  Following sterile skin prep and local anesthetic administration consisting of 1% lidocaine, a 22 gauge needle was advanced without difficulty into the left knee joint using a lateral retropatellar approach.  Placement confirmed by fluoroscopic observation during injection.  A total of 40 cc of a combination of 20 cc Omnipaque 300 and 20 cc of sterile saline was injected into the joint.  The needle was subsequently removed and the skin cleansed and bandaged.  A compression wrap was placed in the suprapatellar region in order to discourage contrast pooling in the suprapatellar bursa.  I had the patient ambulate to CT in order to distribute the contrast in the joint.  No immediate complications were observed.    IMPRESSION: Successful fluoroscopic guided contrast injection of the left knee for CT arthrography.

## 2021-03-06 ENCOUNTER — Other Ambulatory Visit: Payer: Self-pay | Admitting: Physical Medicine and Rehabilitation

## 2021-03-06 MED ORDER — LIDOCAINE 5 % EX PTCH: 2.0000 | MEDICATED_PATCH | Freq: Two times a day (BID) | CUTANEOUS | 0 refills | Status: DC

## 2021-03-12 ENCOUNTER — Other Ambulatory Visit: Payer: Self-pay

## 2021-03-12 ENCOUNTER — Encounter: Payer: Medicare HMO | Attending: Physical Medicine and Rehabilitation | Admitting: Physical Medicine and Rehabilitation

## 2021-03-12 ENCOUNTER — Telehealth (INDEPENDENT_AMBULATORY_CARE_PROVIDER_SITE_OTHER): Payer: Medicare HMO | Admitting: Family Medicine

## 2021-03-12 ENCOUNTER — Encounter: Payer: Self-pay | Admitting: Internal Medicine

## 2021-03-12 ENCOUNTER — Encounter: Payer: Self-pay | Admitting: Physical Medicine and Rehabilitation

## 2021-03-12 ENCOUNTER — Encounter: Payer: Self-pay | Admitting: Family Medicine

## 2021-03-12 VITALS — BP 121/71 | HR 91 | Temp 98.0°F | Ht 66.0 in | Wt 190.0 lb

## 2021-03-12 DIAGNOSIS — E669 Obesity, unspecified: Secondary | ICD-10-CM

## 2021-03-12 DIAGNOSIS — M94262 Chondromalacia, left knee: Secondary | ICD-10-CM | POA: Insufficient documentation

## 2021-03-12 DIAGNOSIS — R3915 Urgency of urination: Secondary | ICD-10-CM | POA: Diagnosis not present

## 2021-03-12 DIAGNOSIS — R252 Cramp and spasm: Secondary | ICD-10-CM | POA: Diagnosis not present

## 2021-03-12 DIAGNOSIS — F5104 Psychophysiologic insomnia: Secondary | ICD-10-CM

## 2021-03-12 DIAGNOSIS — I10 Essential (primary) hypertension: Secondary | ICD-10-CM | POA: Diagnosis not present

## 2021-03-12 DIAGNOSIS — F322 Major depressive disorder, single episode, severe without psychotic features: Secondary | ICD-10-CM

## 2021-03-12 DIAGNOSIS — G894 Chronic pain syndrome: Secondary | ICD-10-CM | POA: Diagnosis not present

## 2021-03-12 DIAGNOSIS — M94261 Chondromalacia, right knee: Secondary | ICD-10-CM | POA: Insufficient documentation

## 2021-03-12 DIAGNOSIS — M961 Postlaminectomy syndrome, not elsewhere classified: Secondary | ICD-10-CM | POA: Diagnosis not present

## 2021-03-12 MED ORDER — PHENTERMINE HCL 37.5 MG PO TABS: 37.5000 mg | ORAL_TABLET | Freq: Every day | ORAL | 1 refills | Status: DC

## 2021-03-12 MED ORDER — CYCLOBENZAPRINE HCL 5 MG PO TABS: 5.0000 mg | ORAL_TABLET | Freq: Three times a day (TID) | ORAL | 3 refills | Status: DC | PRN

## 2021-03-12 MED ORDER — OXYCODONE HCL 5 MG PO TABS: 10.0000 mg | ORAL_TABLET | Freq: Two times a day (BID) | ORAL | 0 refills | Status: DC

## 2021-03-12 MED ORDER — SOLIFENACIN SUCCINATE 5 MG PO TABS: 5.0000 mg | ORAL_TABLET | Freq: Every day | ORAL | 1 refills | Status: DC

## 2021-03-12 NOTE — Patient Instructions (Signed)
F/u in office in 2 months, re evaluate weight, and labs  Please get  Labs ordered in March 1 week before your next appointment  Thankful that depression is better  Increase phentermine to one daily  It is important that you exercise regularly at least 30 minutes 5 times a week. If you develop chest pain, have severe difficulty breathing, or feel very tired, stop exercising immediately and seek medical attention    No changes in medications  Thanks for choosing [facility], we consider it a privelige to serve you.

## 2021-03-12 NOTE — Patient Instructions (Signed)
Bioptemerizers

## 2021-03-12 NOTE — Progress Notes (Signed)
Subjective:    Patient ID: Jane English, female    DOB: 03/26/1971, 50 y.o.   MRN: [id number]  Mrs. Mckinlay presents for follow-up of failed back syndrome, right hip pain, bilateral knee pain, and cervical pain.  1) Failed back syndrome: -She has been having myofascial trigger points with associated muscle spasms. -Otherwise pain has been stable. -She has communicating with a [employer] Rep to see if her current SCS can be removed but was told that the leads cannot be removed. She provided me with Mrs. Young's phone number. -She wants to get her spinal stimulator out.  -I have refilled her Diclofenac gel. She has been using this on her low back with good benefit. She  -has been having more spasms recently.  -She continues to have right buttock pain and she has been dealing with it. She has been doing swats, transitioning from hot and cold.  -She requires a refill of her oxycodone for her chronic pain related to this issue. She has been taking medication as prescribed.  -last refilled in March  2) Plantar fasciitis- well controlled with exercise.   3) s/p fall Since last visit she fell down 17 steps while cleaning the stairs. She has been using the Oxycodne which helps as well as 5% lidocaine patches, using the compounding cream. She is having a hard time sleeping at night. She had a bruise across the back.   4) R buttock pain: Pain radiates posteriorly down her thigh- it does not extend further. It feels constant, sharp, burning, dull, stabbing, tingling, and aching. It is worse with with walking, bending, sitting, and standing. This pain continues today.   5) Asthma: She received 100mg  Nucala for her severe persistent asthma. Immunotherapy note reviewed.   6) Muscle cramps: She has been taking magnesium 400mg  daily (decreased from BID due to diarrhea)  7) Bilateral knee pain: She had good benefit from viscosupplementation in the past and would like to repeat next visit.  -reviewed CT  results with her -she has been using lidocaine patches.   8) Stomach upset: -last night -she takes the pain medication as needed and when she does not take it for some time, she does feel nauseous when she   9) Insomnia: -sleeps between 12 and 1 -uses cell phone and watches TV when she wakes at night and has trouble getting back to bed -prefers no sleep aides -she does like pistachios  Pain Inventory Average Pain 8 Pain Right Now 9 My pain is constant, sharp, burning, dull, stabbing, tingling and aching  In the last 24 hours, has pain interfered with the following? General activity 8 Relation with others 9 Enjoyment of life 10 What TIME of day is your pain at its worst? morning daytime and night Sleep (in general) Fair  Pain is worse with: walking and standing Pain improves with: rest, heat/ice and medication Relief from Meds: 1010     Family History  Problem Relation Age of Onset  . Lung cancer Father   . Stomach cancer Father   . Esophageal cancer Father   . Alcohol abuse Father   . Mental illness Father   . Diabetes Sister   . Hypertension Sister   . Bipolar disorder Sister   . Schizophrenia Sister   . Diabetes Sister   . Alcohol abuse Brother   . Hypertension Brother   . Kidney disease Brother   . Diabetes Brother   . Drug abuse Brother   . Mental illness Brother   .  Alcohol abuse Brother   . Alcohol abuse Brother   . Hypertension Brother   . Diabetes Brother   . Alcohol abuse Brother   . Physical abuse Mother   . Alcohol abuse Mother   . Cirrhosis Mother   . Mental illness Brother        in [place]  . Drug abuse Sister   . HIV Sister   . Alcohol abuse Brother   . Pneumonia Sister        died as a baby  . Alcohol abuse Brother   . Bipolar disorder Brother   . Bipolar disorder Daughter   . Bipolar disorder Son   . Bipolar disorder Son   . Hypertension Brother   . Bipolar disorder Brother   . Drug abuse Brother   . Alcohol abuse Brother   .  Bipolar disorder Brother   . ADD / ADHD Neg Hx   . Anxiety disorder Neg Hx   . Dementia Neg Hx   . Depression Neg Hx   . OCD Neg Hx   . Seizures Neg Hx   . Paranoid behavior Neg Hx   . Colon cancer Neg Hx    Social History   Socioeconomic History  . Marital status: Married    Spouse name: Not on file  . Number of children: 4  . Years of education: Not on file  . Highest education level: Not on file  Occupational History  . Not on file  Tobacco Use  . Smoking status: Never Smoker  . Smokeless tobacco: Never Used  Vaping Use  . Vaping Use: Never used  Substance and Sexual Activity  . Alcohol use: No  . Drug use: No  . Sexual activity: Yes    Partners: Male    Birth control/protection: Surgical    Comment: tubal  Other Topics Concern  . Not on file  Social History Narrative  . Not on file   Social Determinants of Health   Financial Resource Strain: Low Risk   . Difficulty of Paying Living Expenses: Not very hard  Food Insecurity: No Food Insecurity  . Worried About Charity fundraiser in the Last Year: Never true  . Ran Out of Food in the Last Year: Never true  Transportation Needs: No Transportation Needs  . Lack of Transportation (Medical): No  . Lack of Transportation (Non-Medical): No  Physical Activity: Insufficiently Active  . Days of Exercise per Week: 4 days  . Minutes of Exercise per Session: 30 min  Stress: Stress Concern Present  . Feeling of Stress : To some extent  Social Connections: Socially Isolated  . Frequency of Communication with Friends and Family: Three times a week  . Frequency of Social Gatherings with Friends and Family: Twice a week  . Attends Religious Services: Never  . Active Member of Clubs or Organizations: No  . Attends Archivist Meetings: Never  . Marital Status: Separated   Past Surgical History:  Procedure Laterality Date  . ANTERIOR CERVICAL DECOMP/DISCECTOMY FUSION  07/07/2012   Procedure: ANTERIOR CERVICAL  DECOMPRESSION/DISCECTOMY FUSION 2 LEVELS;  Surgeon: Floyce Stakes, MD;  Location: [facility];  Service: Neurosurgery;  Laterality: N/A;  Cervical four-five, five - six  Anterior cervical decompression/diskectomy/fusion/plate  . APPENDECTOMY  1986  . BOWEL RESECTION N/A 07/29/2013   Procedure: serosal repair;  Surgeon: Adin Hector, MD;  Location: [facility];  Service: General;  Laterality: N/A;  . CARPAL TUNNEL RELEASE Bilateral   . COLON SURGERY  N/A    Phreesia 07/29/2020  . LAPAROSCOPY N/A 07/29/2013   Procedure: diagnostic laporoscopy;  Surgeon: Adin Hector, MD;  Location: [facility];  Service: General;  Laterality: N/A;  . LAPAROSCOPY N/A 08/16/2013   Procedure: LAPAROSCOPY DIAGNOSTIC/LYSIS OF ADHESIONS;  Surgeon: Adin Hector, MD;  Location: [facility];  Service: General;  Laterality: N/A;  . LAPAROTOMY N/A 08/16/2013   Procedure: EXPLORATORY LAPAROTOMY/SMALL BOWEL RESECTION (JEJUNUM);  Surgeon: Adin Hector, MD;  Location: [facility];  Service: General;  Laterality: N/A;  . LUMBAR SPINE SURGERY  2010   x 3  . LYSIS OF ADHESION  2003   Dr. Irving Shows  . LYSIS OF ADHESION N/A 07/29/2013   Procedure: LYSIS OF ADHESION;  Surgeon: Adin Hector, MD;  Location: [facility];  Service: General;  Laterality: N/A;  . OOPHORECTOMY    . PARTIAL HYSTERECTOMY  1990s?   [place], [place]  . SPINAL CORD STIMULATOR IMPLANT    . SPINE SURGERY N/A    Phreesia 07/29/2020  . TRIGGER FINGER RELEASE  2009   right pinkie finger  . TUBAL LIGATION  1994   Past Medical History:  Diagnosis Date  . Anemia   . Asthma   . Asthma flare 04/09/2013  . Back pain   . Bronchitis   . Chronic abdominal pain   . Chronic constipation   . Constipation due to opioid therapy   . Depression   . Depression, major, single episode, severe ([place]) 10/03/2018   PHQ 9 score of 15  . Diabetes mellitus without complication ([place])   . Diabetes mellitus, type II ([place])   . DVT (deep venous thrombosis) ([place]) 2010  . GERD  (gastroesophageal reflux disease)   . Heart murmur    no cardiologist  . Helicobacter pylori gastritis 06/11/2013   Colonoscopy Dr. Hilarie Fredrickson  . Hypertension   . IBS (irritable bowel syndrome)   . Migraine headache   . Neuropathy   . Obesity   . Obsessive-compulsive disorder   . PSYCHOTIC D/O W/HALLUCINATIONS CONDS CLASS ELSW 03/04/2010   Qualifier: Diagnosis of  By: Moshe Cipro MD, Joycelyn Schmid    . PTSD (post-traumatic stress disorder)   . SBO (small bowel obstruction) ([place]) 08/09/2013  . Seasonal allergies 12/10/2012  . Seizures ([place])   . Shortness of breath    There were no vitals taken for this visit.  Opioid Risk Score:   Fall Risk Score:  `1  Depression screen PHQ 2/9  Depression screen [facility] 2/9 01/08/2021 12/12/2020 11/14/2020 11/10/2020 11/01/2020 11/01/2020 10/05/2020  Decreased Interest 3 3 1  0 0 0 0  Down, Depressed, Hopeless 1 1 1  0 1 1 0  PHQ - 2 Score 4 4 2  0 1 1 0  Altered sleeping 3 1 3  - - - -  Tired, decreased energy 3 3 2  - - - -  Change in appetite 1 3 1  - - - -  Feeling bad or failure about yourself  2 2 0 - - - -  Trouble concentrating 3 1 0 - - - -  Moving slowly or fidgety/restless 2 1 0 - - - -  Suicidal thoughts 0 0 0 - - - -  PHQ-9 Score 18 15 8  - - - -  Difficult doing work/chores - Very difficult Somewhat difficult - - - -  Some recent data might be hidden   Review of Systems  Constitutional: Negative.   HENT: Negative.   Eyes: Negative.   Respiratory: Positive for wheezing.   Cardiovascular: Negative.  Endocrine: Negative.   Genitourinary: Negative.   Musculoskeletal: Positive for back pain and gait problem.       Spasms  Allergic/Immunologic: Negative.   Neurological: Positive for weakness.       Tingling & numbness  Hematological: Negative.   Psychiatric/Behavioral: Negative.        Objective:   Physical Exam Gen: no distress, normal appearing HEENT: oral mucosa pink and moist, NCAT Cardio: Reg rate Chest: normal effort, normal rate of  breathing Abd: soft, non-distended Ext: no edema Psych: pleasant, normal affect Skin: intact Neuro: Alert and oriented x3.  5/5 left lower extremity strength.  4/5 right lower extremity strength- pain limited.  +Slump test on left for pain radiating in S1 nerve distribution.  Tenderness to palpation over right buttock.  +tenderness to palpation with palpable trigger points in cervical and thoracic myofascia No tenderness to palpation over her knees.  Psych: pleasant, normal affect     Assessment & Plan:  Mrs. Eckhart is a 50 year old woman who f/u for failed back syndrome, inactive spinal cord stimulator with adhesions, right buttock pain, bilateral knee pain, and myofascial pain.  R buttock pain: -Radiates in S1 distribution, associated with weakness on right side, +Slump test. -She has spinal cord stimulator which was a barrier to getting the MRI. She would like to get this removed. Discussed that she can phone [facility] as she is an established patient there. She provided me with phone number of [employer] rep with whom she has been communicating and I have left a message for her to see if we can get this removed for her.  -She has been able to manage this pain well.   Failed back syndrome: -Refilled Oxycodone 10mg  BID. She has been on this dose for many years and it has helped her cope with her pain and maintain her job. Continued to have no negative side effects. UDS obtained routinely and has expected metabolites.  -Continue Diclofenac gel -Prescribed Flexeril PRN -Recommended blue emu oil as well. Showed link of where she can get it online at [employer] as she was having trouble finding it at the store.  -Discussed Sprint PNS system as an option of pain treatment via neuromodulation. Provided following link for patient to learn more about the system: https://www.sprtherapeutics.com/.   -Discussed current symptoms of pain and history of pain.  -Discussed benefits of exercise in  reducing pain. -Discussed following foods that may reduce pain: 1) Ginger 2) Blueberries 3) Salmon 4) Pumpkin seeds 5) dark chocolate 6) turmeric 7) tart cherries 8) virgin olive oil 9) chilli peppers 10) mint  Link to further information on diet for chronic pain: http://www.randall.com/  Turmeric to reduce inflammation--can be used in cooking or taken as a supplement.  Benefits of turmeric:  -Highly anti-inflammatory  -Increases antioxidants  -Improves memory, attention, brain disease  -Lowers risk of heart disease  -May help prevent cancer  -Decreases pain  -Alleviates depression  -Delays aging and decreases risk of chronic disease  -Consume with black pepper to increase absorption    Turmeric Milk Recipe:  1 cup milk  1 tsp turmeric  1 tsp cinnamon  1 tsp grated ginger (optional)  Black pepper (boosts the anti-inflammatory properties of turmeric).  1 tsp honey  2) Myofascial pain syndrome: - trigger point injections next visit.     3) Bilateral knee OA; Reviewed CT results with her. Continue lidocaine patches  4) Mood: Very positive. Very excited about her grandchildren. Very happy that her husband has  returned.   5) Muscle cramps: Continue magnesium to 400mg  daily. Advised regarding [employer]'s magnesium to get all 7 types of magnesium  6) Plantar fasciitis: Bilateral, provided with home exercises, educated regarding typical treatments. Recommended rolling plantar aspect of feet on tennis balls. She has been doing this and symptoms have improved without boot.  7) Quality of life: She is enjoying work. Had a good Mother's Day.   8) Bilateral hand OA: can try using diclofenac gel or blue emu oil here as well since it is effective and less expensive than the compounded cream.   9) Insomnia: -Discussed good sleep hygiene: turning off all devices an hour before bedtime.   -Encouraged pistachios and educated that they help to increase natural melatonin production

## 2021-03-12 NOTE — Progress Notes (Signed)
Virtual Visit via Telephone Note  I connected with [place] on 03/12/21 at  3:00 PM EDT by telephone and verified that I am speaking with the correct person using two identifiers.  Location: Patient: home Provider: office   I discussed the limitations, risks, security and privacy concerns of performing an evaluation and management service by telephone and the availability of in person appointments. I also discussed with the patient that there may be a patient responsible charge related to this service. The patient expressed understanding and agreed to proceed.   History of Present Illness:   f/U depression and chronic illness Unable to lose weight and considering surgery Observations/Objective:  BP 121/71   Pulse 91   Temp 98 F (36.7 C)   Ht 5\' 6"  (1.676 m)   Wt 190 lb (86.2 kg)   SpO2 96%   BMI 30.67 kg/m  Good communication with no confusion and intact memory. Alert and oriented x 3 No signs of respiratory distress during speech   Assessment and Plan: Depression, major, single episode, severe ([place]) controlled and managed on current medications, continue same  Essential hypertension DASH diet and commitment to daily physical activity for a minimum of 30 minutes discussed and encouraged, as a part of hypertension management. The importance of attaining a healthy weight is also discussed.  BP/Weight 03/12/2021 03/12/2021 01/26/2021 01/08/2021 12/19/2020 12/12/2020 9/98/3382  Systolic BP 505 397 673 - 419 - 379  Diastolic BP 71 71 76 - 72 - 75  Wt. (Lbs) 190 190 188.2 189 183.4 189.1 189  BMI 30.67 30.67 30.38 30.51 29.6 30.52 30.51  Some encounter information is confidential and restricted. Go to Review Flowsheets activity to see all data.       Obesity (BMI 30.0-34.9) Increase phentermine dose to one daily  Patient re-educated about  the importance of commitment to a  minimum of 150 minutes of exercise per week as able.  The importance of healthy food choices with  portion control discussed, as well as eating regularly and within a 12 hour window most days. The need to choose "clean , green" food 50 to 75% of the time is discussed, as well as to make water the primary drink and set a goal of 64 ounces water daily.    Weight /BMI 03/12/2021 03/12/2021 01/26/2021  WEIGHT 190 lb 190 lb 188 lb 3.2 oz  HEIGHT 5\' 6"  5\' 6"  5\' 6"   BMI 30.67 kg/m2 30.67 kg/m2 30.38 kg/m2  Some encounter information is confidential and restricted. Go to Review Flowsheets activity to see all data.      Urinary urgency Response fairly good to oral medication, continue same  Insomnia Controlled, no change in medication     Follow Up Instructions:    I discussed the assessment and treatment plan with the patient. The patient was provided an opportunity to ask questions and all were answered. The patient agreed with the plan and demonstrated an understanding of the instructions.   The patient was advised to call back or seek an in-person evaluation if the symptoms worsen or if the condition fails to improve as anticipated.  I provided 14 minutes of non-face-to-face time during this encounter.   Tula Nakayama, MD

## 2021-03-13 ENCOUNTER — Encounter: Payer: Self-pay | Admitting: Family Medicine

## 2021-03-13 ENCOUNTER — Telehealth: Payer: Self-pay

## 2021-03-13 NOTE — Assessment & Plan Note (Signed)
Controlled, no change in medication  

## 2021-03-13 NOTE — Telephone Encounter (Signed)
Per Dr Moshe Cipro needs to go urgent care. Patient was told feels this bad needs to go to urgent care. Patient called had booster shot, has body aches/ headaches and very sick.

## 2021-03-13 NOTE — Assessment & Plan Note (Signed)
Increase phentermine dose to one daily  Patient re-educated about  the importance of commitment to a  minimum of 150 minutes of exercise per week as able.  The importance of healthy food choices with portion control discussed, as well as eating regularly and within a 12 hour window most days. The need to choose "clean , green" food 50 to 75% of the time is discussed, as well as to make water the primary drink and set a goal of 64 ounces water daily.    Weight /BMI 03/12/2021 03/12/2021 01/26/2021  WEIGHT 190 lb 190 lb 188 lb 3.2 oz  HEIGHT 5\' 6"  5\' 6"  5\' 6"   BMI 30.67 kg/m2 30.67 kg/m2 30.38 kg/m2  Some encounter information is confidential and restricted. Go to Review Flowsheets activity to see all data.

## 2021-03-13 NOTE — Assessment & Plan Note (Addendum)
controlled and managed on current medications, continue same

## 2021-03-13 NOTE — Assessment & Plan Note (Signed)
Response fairly good to oral medication, continue same

## 2021-03-13 NOTE — Assessment & Plan Note (Signed)
DASH diet and commitment to daily physical activity for a minimum of 30 minutes discussed and encouraged, as a part of hypertension management. The importance of attaining a healthy weight is also discussed.  BP/Weight 03/12/2021 03/12/2021 01/26/2021 01/08/2021 12/19/2020 12/12/2020 12/18/863  Systolic BP 784 696 295 - 284 - 132  Diastolic BP 71 71 76 - 72 - 75  Wt. (Lbs) 190 190 188.2 189 183.4 189.1 189  BMI 30.67 30.67 30.38 30.51 29.6 30.52 30.51  Some encounter information is confidential and restricted. Go to Review Flowsheets activity to see all data.

## 2021-03-22 ENCOUNTER — Telehealth: Payer: Self-pay

## 2021-03-22 NOTE — Telephone Encounter (Signed)
Pt will need a virtual visit to discuss.  

## 2021-03-22 NOTE — Telephone Encounter (Signed)
Pt is calling to get some Tesslon Pearls, Cough Medication and possible antiboitic, she has the same thing she had within the last month when she was in [place].   I advised that we would probably do a phone visit, and that Dr Moshe Cipro was in the office on this Friday .

## 2021-03-25 DIAGNOSIS — Z0100 Encounter for examination of eyes and vision without abnormal findings: Secondary | ICD-10-CM | POA: Diagnosis not present

## 2021-03-25 DIAGNOSIS — E119 Type 2 diabetes mellitus without complications: Secondary | ICD-10-CM | POA: Diagnosis not present

## 2021-04-09 ENCOUNTER — Encounter: Payer: Medicare HMO | Attending: Physical Medicine and Rehabilitation | Admitting: Registered Nurse

## 2021-04-09 ENCOUNTER — Other Ambulatory Visit: Payer: Self-pay

## 2021-04-09 ENCOUNTER — Encounter: Payer: Self-pay | Admitting: Registered Nurse

## 2021-04-09 VITALS — BP 113/75 | HR 84 | Temp 97.8°F | Ht 66.0 in | Wt 187.2 lb

## 2021-04-09 DIAGNOSIS — G894 Chronic pain syndrome: Secondary | ICD-10-CM | POA: Diagnosis not present

## 2021-04-09 DIAGNOSIS — Z79891 Long term (current) use of opiate analgesic: Secondary | ICD-10-CM | POA: Insufficient documentation

## 2021-04-09 DIAGNOSIS — M545 Low back pain, unspecified: Secondary | ICD-10-CM | POA: Diagnosis not present

## 2021-04-09 DIAGNOSIS — G8929 Other chronic pain: Secondary | ICD-10-CM | POA: Insufficient documentation

## 2021-04-09 DIAGNOSIS — M961 Postlaminectomy syndrome, not elsewhere classified: Secondary | ICD-10-CM | POA: Diagnosis not present

## 2021-04-09 DIAGNOSIS — M62830 Muscle spasm of back: Secondary | ICD-10-CM

## 2021-04-09 DIAGNOSIS — Z5181 Encounter for therapeutic drug level monitoring: Secondary | ICD-10-CM | POA: Insufficient documentation

## 2021-04-09 DIAGNOSIS — M94262 Chondromalacia, left knee: Secondary | ICD-10-CM | POA: Diagnosis not present

## 2021-04-09 DIAGNOSIS — M25511 Pain in right shoulder: Secondary | ICD-10-CM | POA: Diagnosis not present

## 2021-04-09 DIAGNOSIS — M25512 Pain in left shoulder: Secondary | ICD-10-CM

## 2021-04-09 MED ORDER — OXYCODONE HCL 5 MG PO TABS: 10.0000 mg | ORAL_TABLET | Freq: Two times a day (BID) | ORAL | 0 refills | Status: DC

## 2021-04-09 NOTE — Progress Notes (Signed)
Subjective:    Patient ID: Jane English, female    DOB: 10-Nov-1970, 50 y.o.   MRN: [id number]  HPI: Jane English is a 50 y.o. female who returns for follow up appointment for chronic pain and medication refill. She states her pain is located in her bilateral shoulder pain,  lower back pain  and left knee pain. Also reports muscle spasm in her upper back. She rates her pain 8. Her current exercise regime is walking and performing stretching exercises.  [name] Morphine equivalent is 30.00 MME. She is also prescribed alprazolam by Dr. Posey Pronto. We have discussed the black box warning of using opioids and benzodiazepines. I highlighted the dangers of using these drugs together and discussed the adverse events including respiratory suppression, overdose, cognitive impairment and importance of compliance with current regimen. We will continue to monitor and adjust as indicated.    Last UDS was Performed on 01/26/2021, it was consistent.    Pain Inventory Average Pain 8 Pain Right Now 8 My pain is sharp, burning, dull, stabbing, tingling and aching  In the last 24 hours, has pain interfered with the following? General activity 8 Relation with others 9 Enjoyment of life 9 What TIME of day is your pain at its worst? evening and night Sleep (in general) Good  Pain is worse with: walking, sitting and standing Pain improves with: rest, heat/ice and medication Relief from Meds: 10  Family History  Problem Relation Age of Onset  . Lung cancer Father   . Stomach cancer Father   . Esophageal cancer Father   . Alcohol abuse Father   . Mental illness Father   . Diabetes Sister   . Hypertension Sister   . Bipolar disorder Sister   . Schizophrenia Sister   . Diabetes Sister   . Alcohol abuse Brother   . Hypertension Brother   . Kidney disease Brother   . Diabetes Brother   . Drug abuse Brother   . Mental illness Brother   . Alcohol abuse Brother   . Alcohol abuse Brother   .  Hypertension Brother   . Diabetes Brother   . Alcohol abuse Brother   . Physical abuse Mother   . Alcohol abuse Mother   . Cirrhosis Mother   . Mental illness Brother        in [place]  . Drug abuse Sister   . HIV Sister   . Alcohol abuse Brother   . Pneumonia Sister        died as a baby  . Alcohol abuse Brother   . Bipolar disorder Brother   . Bipolar disorder Daughter   . Bipolar disorder Son   . Bipolar disorder Son   . Hypertension Brother   . Bipolar disorder Brother   . Drug abuse Brother   . Alcohol abuse Brother   . Bipolar disorder Brother   . ADD / ADHD Neg Hx   . Anxiety disorder Neg Hx   . Dementia Neg Hx   . Depression Neg Hx   . OCD Neg Hx   . Seizures Neg Hx   . Paranoid behavior Neg Hx   . Colon cancer Neg Hx    Social History   Socioeconomic History  . Marital status: Married    Spouse name: Not on file  . Number of children: 4  . Years of education: Not on file  . Highest education level: Not on file  Occupational History  . Not on file  Tobacco Use  . Smoking status: Never Smoker  . Smokeless tobacco: Never Used  Vaping Use  . Vaping Use: Never used  Substance and Sexual Activity  . Alcohol use: No  . Drug use: No  . Sexual activity: Yes    Partners: Male    Birth control/protection: Surgical    Comment: tubal  Other Topics Concern  . Not on file  Social History Narrative  . Not on file   Social Determinants of Health   Financial Resource Strain: Low Risk   . Difficulty of Paying Living Expenses: Not very hard  Food Insecurity: No Food Insecurity  . Worried About Charity fundraiser in the Last Year: Never true  . Ran Out of Food in the Last Year: Never true  Transportation Needs: No Transportation Needs  . Lack of Transportation (Medical): No  . Lack of Transportation (Non-Medical): No  Physical Activity: Insufficiently Active  . Days of Exercise per Week: 4 days  . Minutes of Exercise per Session: 30 min  Stress: Stress  Concern Present  . Feeling of Stress : To some extent  Social Connections: Socially Isolated  . Frequency of Communication with Friends and Family: Three times a week  . Frequency of Social Gatherings with Friends and Family: Twice a week  . Attends Religious Services: Never  . Active Member of Clubs or Organizations: No  . Attends Archivist Meetings: Never  . Marital Status: Separated   Past Surgical History:  Procedure Laterality Date  . ANTERIOR CERVICAL DECOMP/DISCECTOMY FUSION  07/07/2012   Procedure: ANTERIOR CERVICAL DECOMPRESSION/DISCECTOMY FUSION 2 LEVELS;  Surgeon: Floyce Stakes, MD;  Location: [facility];  Service: Neurosurgery;  Laterality: N/A;  Cervical four-five, five - six  Anterior cervical decompression/diskectomy/fusion/plate  . APPENDECTOMY  1986  . BOWEL RESECTION N/A 07/29/2013   Procedure: serosal repair;  Surgeon: Adin Hector, MD;  Location: [facility];  Service: General;  Laterality: N/A;  . CARPAL TUNNEL RELEASE Bilateral   . COLON SURGERY N/A    Phreesia 07/29/2020  . LAPAROSCOPY N/A 07/29/2013   Procedure: diagnostic laporoscopy;  Surgeon: Adin Hector, MD;  [facility];  Service: General;  Laterality: N/A;  . LAPAROSCOPY N/A 08/16/2013   Procedure: LAPAROSCOPY DIAGNOSTIC/LYSIS OF ADHESIONS;  Surgeon: Adin Hector, MD;  [facility];  Service: General;  Laterality: N/A;  . LAPAROTOMY N/A 08/16/2013   Procedure: EXPLORATORY LAPAROTOMY/SMALL BOWEL RESECTION (JEJUNUM);  Surgeon: Adin Hector, MD;  [facility];  Service: General;  Laterality: N/A;  . LUMBAR SPINE SURGERY  2010   x 3  . LYSIS OF ADHESION  2003   Dr. Irving Shows  . LYSIS OF ADHESION N/A 07/29/2013   Procedure: LYSIS OF ADHESION;  Surgeon: Adin Hector, MD;  [facility];  Service: General;  Laterality: N/A;  . OOPHORECTOMY    . PARTIAL HYSTERECTOMY  1990s?   [place], [place]  . SPINAL CORD STIMULATOR IMPLANT    . SPINE SURGERY N/A    Phreesia 07/29/2020   . TRIGGER FINGER RELEASE  2009   right pinkie finger  . TUBAL LIGATION  1994   Past Surgical History:  Procedure Laterality Date  . ANTERIOR CERVICAL DECOMP/DISCECTOMY FUSION  07/07/2012   Procedure: ANTERIOR CERVICAL DECOMPRESSION/DISCECTOMY FUSION 2 LEVELS;  Surgeon: Floyce Stakes, MD;  [facility];  Service: Neurosurgery;  Laterality: N/A;  Cervical four-five, five - six  Anterior cervical decompression/diskectomy/fusion/plate  . APPENDECTOMY  1986  . BOWEL  RESECTION N/A 07/29/2013   Procedure: serosal repair;  Surgeon: Adin Hector, MD;  [facility];  Service: General;  Laterality: N/A;  . CARPAL TUNNEL RELEASE Bilateral   . COLON SURGERY N/A    Phreesia 07/29/2020  . LAPAROSCOPY N/A 07/29/2013   Procedure: diagnostic laporoscopy;  Surgeon: Adin Hector, MD;  [facility];  Service: General;  Laterality: N/A;  . LAPAROSCOPY N/A 08/16/2013   Procedure: LAPAROSCOPY DIAGNOSTIC/LYSIS OF ADHESIONS;  Surgeon: Adin Hector, MD;  [facility];  Service: General;  Laterality: N/A;  . LAPAROTOMY N/A 08/16/2013   Procedure: EXPLORATORY LAPAROTOMY/SMALL BOWEL RESECTION (JEJUNUM);  Surgeon: Adin Hector, MD;  [facility];  Service: General;  Laterality: N/A;  . LUMBAR SPINE SURGERY  2010   x 3  . LYSIS OF ADHESION  2003   Dr. Irving Shows  . LYSIS OF ADHESION N/A 07/29/2013   Procedure: LYSIS OF ADHESION;  Surgeon: Adin Hector, MD;  [facility];  Service: General;  Laterality: N/A;  . OOPHORECTOMY    . PARTIAL HYSTERECTOMY  1990s?   [place], [place]  . SPINAL CORD STIMULATOR IMPLANT    . SPINE SURGERY N/A    Phreesia 07/29/2020  . TRIGGER FINGER RELEASE  2009   right pinkie finger  . TUBAL LIGATION  1994   Past Medical History:  Diagnosis Date  . Anemia   . Asthma   . Asthma flare 04/09/2013  . Back pain   . Bronchitis   . Chronic abdominal pain   . Chronic constipation   . Constipation due to opioid therapy   . Depression   .  Depression, major, single episode, severe ([place]) 10/03/2018   PHQ 9 score of 15  . Diabetes mellitus without complication ([place])   . Diabetes mellitus, type II ([place])   . DVT (deep venous thrombosis) ([place]) 2010  . GERD (gastroesophageal reflux disease)   . Heart murmur    no cardiologist  . Helicobacter pylori gastritis 06/11/2013   Colonoscopy Dr. Hilarie Fredrickson  . Hypertension   . IBS (irritable bowel syndrome)   . Migraine headache   . Neuropathy   . Obesity   . Obsessive-compulsive disorder   . PSYCHOTIC D/O W/HALLUCINATIONS CONDS CLASS ELSW 03/04/2010   Qualifier: Diagnosis of  By: Moshe Cipro MD, Joycelyn Schmid    . PTSD (post-traumatic stress disorder)   . SBO (small bowel obstruction) ([place]) 08/09/2013  . Seasonal allergies 12/10/2012  . Seizures ([place])   . Shortness of breath    BP 113/75   Pulse 84   Temp 97.8 F (36.6 C)   Wt 187 lb 3.2 oz (84.9 kg)   SpO2 95%   BMI 30.21 kg/m   Opioid Risk Score:   Fall Risk Score:  `1  Depression screen PHQ 2/9  Depression screen [facility] 2/9 03/12/2021 03/12/2021 01/08/2021 12/12/2020 11/14/2020 11/10/2020 11/01/2020  Decreased Interest 0 0 3 3 1  0 0  Down, Depressed, Hopeless 0 0 1 1 1  0 1  PHQ - 2 Score 0 0 4 4 2  0 1  Altered sleeping - - 3 1 3  - -  Tired, decreased energy - - 3 3 2  - -  Change in appetite - - 1 3 1  - -  Feeling bad or failure about yourself  - - 2 2 0 - -  Trouble concentrating - - 3 1 0 - -  Moving slowly or fidgety/restless - - 2 1 0 - -  Suicidal thoughts - - 0 0  0 - -  PHQ-9 Score - - 18 15 8  - -  Difficult doing work/chores - - - Very difficult Somewhat difficult - -  Some recent data might be hidden      Review of Systems  Constitutional: Negative.   HENT: Negative.   Eyes: Negative.   Respiratory: Negative.   Cardiovascular: Negative.   Gastrointestinal: Negative.   Endocrine: Negative.   Genitourinary: Negative.   Musculoskeletal: Positive for back pain.       Left knee pain , left and right shoulder pain  Skin:  Negative.   Allergic/Immunologic: Negative.   Neurological: Negative.   Hematological: Negative.   Psychiatric/Behavioral: Negative.        Objective:   Physical Exam Vitals and nursing note reviewed.  Constitutional:      Appearance: Normal appearance.  Cardiovascular:     Rate and Rhythm: Normal rate and regular rhythm.     Pulses: Normal pulses.     Heart sounds: Normal heart sounds.  Pulmonary:     Effort: Pulmonary effort is normal.     Breath sounds: Normal breath sounds.  Musculoskeletal:     Cervical back: Normal range of motion and neck supple.     Comments: Normal Muscle Bulk and Muscle Testing Reveals:  Upper Extremities: Full ROM and Muscle Strength 5/5 Bilateral AC Joint Tenderness Thoracic Hypersensitivity Lumbar Paraspinal Tenderness: L-4-L-5 Lower Extremities: Full ROM and Muscle Strength 5/5 Arises from Table with Ease Narrow Based  Gait   Skin:    General: Skin is warm and dry.  Neurological:     Mental Status: She is alert and oriented to person, place, and time.  Psychiatric:        Mood and Affect: Mood normal.        Behavior: Behavior normal.           Assessment & Plan:  1. Chronic Pain of Bilateral Shoulders: Continue HEP as Tolerated. Continue to Monitor.  2. Failed Back Syndrome of Lumbar Syndrome : Continue HEP as Tolerated. Continue current medication regimen. Continue to Monitor.  3. Chronic Lower Back Pain without Sciatica: Continue HEP as Tolerated. Continue to Monitor.  4. Left Knee Pain: Continue HEP as Tolerated. Continue to Monitor.  5. Muscle Spasm of Shoulders/ Upper Back : Continue Flexeril. Continue to Monitor.  6. Chronic Pain Syndrome: Refilled: Oxycodone 5mg /325 mg two tablets twice a day #120. We will continue the opioid monitoring program, this consists of regular clinic visits, examinations, urine drug screen, pill counts as well as use of [place] Controlled Substance Reporting system. A 12 month History has been  reviewed on the [place] Controlled Substance Reporting System on 04/09/2021.   F/U in 1 month

## 2021-04-19 ENCOUNTER — Ambulatory Visit: Payer: Medicare HMO | Admitting: Internal Medicine

## 2021-05-01 ENCOUNTER — Ambulatory Visit: Payer: Medicare HMO

## 2021-05-08 ENCOUNTER — Encounter: Payer: Self-pay | Admitting: Registered Nurse

## 2021-05-08 ENCOUNTER — Encounter: Payer: Medicare HMO | Attending: Physical Medicine and Rehabilitation | Admitting: Registered Nurse

## 2021-05-08 ENCOUNTER — Other Ambulatory Visit: Payer: Self-pay

## 2021-05-08 VITALS — BP 112/72 | HR 77 | Temp 98.1°F | Ht 66.0 in | Wt 192.8 lb

## 2021-05-08 DIAGNOSIS — G8929 Other chronic pain: Secondary | ICD-10-CM | POA: Diagnosis not present

## 2021-05-08 DIAGNOSIS — G894 Chronic pain syndrome: Secondary | ICD-10-CM | POA: Insufficient documentation

## 2021-05-08 DIAGNOSIS — M62830 Muscle spasm of back: Secondary | ICD-10-CM | POA: Diagnosis not present

## 2021-05-08 DIAGNOSIS — Z5181 Encounter for therapeutic drug level monitoring: Secondary | ICD-10-CM | POA: Diagnosis not present

## 2021-05-08 DIAGNOSIS — M94262 Chondromalacia, left knee: Secondary | ICD-10-CM | POA: Diagnosis not present

## 2021-05-08 DIAGNOSIS — M961 Postlaminectomy syndrome, not elsewhere classified: Secondary | ICD-10-CM | POA: Insufficient documentation

## 2021-05-08 DIAGNOSIS — M25512 Pain in left shoulder: Secondary | ICD-10-CM | POA: Diagnosis not present

## 2021-05-08 DIAGNOSIS — M25511 Pain in right shoulder: Secondary | ICD-10-CM | POA: Diagnosis not present

## 2021-05-08 DIAGNOSIS — M545 Low back pain, unspecified: Secondary | ICD-10-CM

## 2021-05-08 DIAGNOSIS — Z79891 Long term (current) use of opiate analgesic: Secondary | ICD-10-CM | POA: Diagnosis not present

## 2021-05-08 MED ORDER — OXYCODONE HCL 5 MG PO TABS: 10.0000 mg | ORAL_TABLET | Freq: Two times a day (BID) | ORAL | 0 refills | Status: DC

## 2021-05-08 NOTE — Progress Notes (Signed)
Subjective:    Patient ID: Jane English, female    DOB: November 30, 1970, 50 y.o.   MRN: [id number]  HPI: Jane English is a 50 y.o. female who returns for follow up appointment for chronic pain and medication refill. She states her pain is located in her  bilateral shoulders L>R and lower back pain. She rates her pain 7. Her current exercise regime is walking and performing stretching exercises.   Ms. [place] Morphine equivalent is 30.00 MME.   Last UDS was Performed on 01/26/2021, it was consistent.    Pain Inventory Average Pain 9 Pain Right Now 7 My pain is intermittent, constant, sharp, burning, dull, stabbing, tingling, and aching  In the last 24 hours, has pain interfered with the following? General activity 10 Relation with others 10 Enjoyment of life 10 What TIME of day is your pain at its worst? morning , evening, and night Sleep (in general) Good  Pain is worse with: walking, sitting, standing, and some activites Pain improves with: rest and medication Relief from Meds: 9  Family History  Problem Relation Age of Onset   Lung cancer Father    Stomach cancer Father    Esophageal cancer Father    Alcohol abuse Father    Mental illness Father    Diabetes Sister    Hypertension Sister    Bipolar disorder Sister    Schizophrenia Sister    Diabetes Sister    Alcohol abuse Brother    Hypertension Brother    Kidney disease Brother    Diabetes Brother    Drug abuse Brother    Mental illness Brother    Alcohol abuse Brother    Alcohol abuse Brother    Hypertension Brother    Diabetes Brother    Alcohol abuse Brother    Physical abuse Mother    Alcohol abuse Mother    Cirrhosis Mother    Mental illness Brother        in [place]   Drug abuse Sister    HIV Sister    Alcohol abuse Brother    Pneumonia Sister        died as a baby   Alcohol abuse Brother    Bipolar disorder Brother    Bipolar disorder Daughter    Bipolar disorder Son    Bipolar disorder Son     Hypertension Brother    Bipolar disorder Brother    Drug abuse Brother    Alcohol abuse Brother    Bipolar disorder Brother    ADD / ADHD Neg Hx    Anxiety disorder Neg Hx    Dementia Neg Hx    Depression Neg Hx    OCD Neg Hx    Seizures Neg Hx    Paranoid behavior Neg Hx    Colon cancer Neg Hx    Social History   Socioeconomic History   Marital status: Married    Spouse name: Not on file   Number of children: 4   Years of education: Not on file   Highest education level: Not on file  Occupational History   Not on file  Tobacco Use   Smoking status: Never   Smokeless tobacco: Never  Vaping Use   Vaping Use: Never used  Substance and Sexual Activity   Alcohol use: No   Drug use: No   Sexual activity: Yes    Partners: Male    Birth control/protection: Surgical    Comment: tubal  Other Topics Concern  Not on file  Social History Narrative   Not on file   Social Determinants of Health   Financial Resource Strain: Low Risk    Difficulty of Paying Living Expenses: Not very hard  Food Insecurity: No Food Insecurity   Worried About Running Out of Food in the Last Year: Never true   Ran Out of Food in the Last Year: Never true  Transportation Needs: No Transportation Needs   Lack of Transportation (Medical): No   Lack of Transportation (Non-Medical): No  Physical Activity: Insufficiently Active   Days of Exercise per Week: 4 days   Minutes of Exercise per Session: 30 min  Stress: Stress Concern Present   Feeling of Stress : To some extent  Social Connections: Socially Isolated   Frequency of Communication with Friends and Family: Three times a week   Frequency of Social Gatherings with Friends and Family: Twice a week   Attends Religious Services: Never   Marine scientist or Organizations: No   Attends Music therapist: Never   Marital Status: Separated   Past Surgical History:  Procedure Laterality Date   ANTERIOR CERVICAL  DECOMP/DISCECTOMY FUSION  07/07/2012   Procedure: ANTERIOR CERVICAL DECOMPRESSION/DISCECTOMY FUSION 2 LEVELS;  Surgeon: Floyce Stakes, MD;  [facility]: [facility];  Service: Neurosurgery;  Laterality: N/A;  Cervical four-five, five - six  Anterior cervical decompression/diskectomy/fusion/plate   APPENDECTOMY  1986   BOWEL RESECTION N/A 07/29/2013   Procedure: serosal repair;  Surgeon: Adin Hector, MD;  [facility];  Service: General;  Laterality: N/A;   CARPAL TUNNEL RELEASE Bilateral    COLON SURGERY N/A    Phreesia 07/29/2020   LAPAROSCOPY N/A 07/29/2013   Procedure: diagnostic laporoscopy;  Surgeon: Adin Hector, MD;  [facility];  Service: General;  Laterality: N/A;   LAPAROSCOPY N/A 08/16/2013   Procedure: LAPAROSCOPY DIAGNOSTIC/LYSIS OF ADHESIONS;  Surgeon: Adin Hector, MD;  [facility];  Service: General;  Laterality: N/A;   LAPAROTOMY N/A 08/16/2013   Procedure: EXPLORATORY LAPAROTOMY/SMALL BOWEL RESECTION (JEJUNUM);  Surgeon: Adin Hector, MD;  [facility];  Service: General;  Laterality: N/A;   LUMBAR SPINE SURGERY  2010   x 3   LYSIS OF ADHESION  2003   Dr. Irving Shows   LYSIS OF ADHESION N/A 07/29/2013   Procedure: LYSIS OF ADHESION;  Surgeon: Adin Hector, MD;  [facility];  Service: General;  Laterality: N/A;   [place]?   [place], [place]   SPINAL CORD STIMULATOR IMPLANT     SPINE SURGERY N/A    Phreesia 07/29/2020   TRIGGER FINGER RELEASE  2009   right pinkie finger   TUBAL LIGATION  1994   Past Surgical History:  Procedure Laterality Date   ANTERIOR CERVICAL DECOMP/DISCECTOMY FUSION  07/07/2012   Procedure: ANTERIOR CERVICAL DECOMPRESSION/DISCECTOMY FUSION 2 LEVELS;  Surgeon: Floyce Stakes, MD;  [facility];  Service: Neurosurgery;  Laterality: N/A;  Cervical four-five, five - six  Anterior cervical decompression/diskectomy/fusion/plate   APPENDECTOMY  1986   BOWEL RESECTION N/A  07/29/2013   Procedure: serosal repair;  Surgeon: Adin Hector, MD;  [facility];  Service: General;  Laterality: N/A;   CARPAL TUNNEL RELEASE Bilateral    COLON SURGERY N/A    [facility]reesia 07/29/2020   LAPAROSCOPY N/A 07/29/2013   Procedure: diagnostic laporoscopy;  Surgeon: Adin Hector, MD;  [facility];  Service: General;  Laterality: N/A;   LAPAROSCOPY N/A 08/16/2013   Procedure: LAPAROSCOPY DIAGNOSTIC/LYSIS OF ADHESIONS;  Surgeon: Adin Hector, MD;  [facility];  Service: General;  Laterality: N/A;   LAPAROTOMY N/A 08/16/2013   Procedure: EXPLORATORY LAPAROTOMY/SMALL BOWEL RESECTION (JEJUNUM);  Surgeon: Adin Hector, MD;  [facility];  Service: General;  Laterality: N/A;   LUMBAR SPINE SURGERY  2010   x 3   LYSIS OF ADHESION  2003   Dr. Irving Shows   LYSIS OF ADHESION N/A 07/29/2013   Procedure: LYSIS OF ADHESION;  Surgeon: Adin Hector, MD;  [facility];  Service: General;  Laterality: N/A;   [place]?   [place], [place]   SPINAL CORD STIMULATOR IMPLANT     SPINE SURGERY N/A    Phreesia 07/29/2020   TRIGGER FINGER RELEASE  2009   right pinkie finger   TUBAL LIGATION  1994   Past Medical History:  Diagnosis Date   Anemia    Asthma    Asthma flare 04/09/2013   Back pain    Bronchitis    Chronic abdominal pain    Chronic constipation    Constipation due to opioid therapy    Depression    Depression, major, single episode, severe ([place]) 10/03/2018   PHQ 9 score of 15   Diabetes mellitus without complication ([place])    Diabetes mellitus, type II ([place])    DVT (deep venous thrombosis) ([place]) 2010   GERD (gastroesophageal reflux disease)    Heart murmur    no cardiologist   Helicobacter pylori gastritis 06/11/2013   Colonoscopy Dr. Hilarie Fredrickson   Hypertension    IBS (irritable bowel syndrome)    Migraine headache    Neuropathy    Obesity    Obsessive-compulsive disorder    PSYCHOTIC D/O W/HALLUCINATIONS CONDS  CLASS ELSW 03/04/2010   Qualifier: Diagnosis of  By: Moshe Cipro MD, Margaret     PTSD (post-traumatic stress disorder)    SBO (small bowel obstruction) ([place]) 08/09/2013   Seasonal allergies 12/10/2012   Seizures ([facility])    Shortness of breath    BP 112/72   Pulse 77   Temp 98.1 F (36.7 C)   Ht 5\' 6"  (1.676 m)   Wt 192 lb 12.8 oz (87.5 kg)   SpO2 96%   BMI 31.12 kg/m   Opioid Risk Score:   Fall Risk Score:  `1  Depression screen PHQ 2/9  Depression screen [facility] 2/9 04/09/2021 03/12/2021 03/12/2021 01/08/2021 12/12/2020 11/14/2020 11/10/2020  Decreased Interest 0 0 0 3 3 1  0  Down, Depressed, Hopeless 0 0 0 1 1 1  0  PHQ - 2 Score 0 0 0 4 4 2  0  Altered sleeping - - - 3 1 3  -  Tired, decreased energy - - - 3 3 2  -  Change in appetite - - - 1 3 1  -  Feeling bad or failure about yourself  - - - 2 2 0 -  Trouble concentrating - - - 3 1 0 -  Moving slowly or fidgety/restless - - - 2 1 0 -  Suicidal thoughts - - - 0 0 0 -  PHQ-9 Score - - - 18 15 8  -  Difficult doing work/chores - - - - Very difficult Somewhat difficult -  Some recent data might be hidden    Review of Systems  Musculoskeletal:  Positive for back pain and neck pain.       Left shoulder pain  Left knee pain  All other systems reviewed and are negative.     Objective:   Physical Exam Vitals and nursing note reviewed.  Constitutional:      Appearance: Normal appearance.  Cardiovascular:     Rate and Rhythm: Normal rate and regular rhythm.     Pulses: Normal pulses.     Heart sounds: Normal heart sounds.  Pulmonary:     Effort: Pulmonary effort is normal.     Breath sounds: Normal breath sounds.  Musculoskeletal:     Cervical back: Normal range of motion and neck supple.     Comments: Normal Muscle Bulk and Muscle Testing Reveals:  Upper Extremities: Full ROM and Muscle Strength  5/5 Left AC Joint Tenderness Lumbar Paraspinal Tenderness: L-3-L-5 Lower Extremities: Full ROM and Muscle Strength 5/5 Arises from chair with  ease Narrow Based  Gait     Skin:    General: Skin is warm and dry.  Neurological:     Mental Status: She is alert and oriented to person, place, and time.  Psychiatric:        Mood and Affect: Mood normal.        Behavior: Behavior normal.         Assessment & Plan:  1. Chronic Pain of Bilateral Shoulders L>R: Continue HEP as Tolerated. Continue to Monitor. 05/08/2021 2. Failed Back Syndrome of Lumbar Syndrome : Continue HEP as Tolerated. Continue current medication regimen. Continue to Monitor. 05/08/2021 3. Chronic Lower Back Pain without Sciatica: Continue HEP as Tolerated. Continue to Monitor.05/08/2021 4. Left Knee Pain: No Complaints today. Continue HEP as Tolerated. Continue to Monitor.05/08/2021 5. Muscle Spasm of Shoulders/ Upper Back : Continue Flexeril. Continue to Monitor.05/08/2021 6. Chronic Pain Syndrome: Refilled: Oxycodone 5mg /325 mg two tablets twice a day #120. We will continue the opioid monitoring program, this consists of regular clinic visits, examinations, urine drug screen, pill counts as well as use of [place] Controlled Substance Reporting system. A 12 month History has been reviewed on the [place] Controlled Substance Reporting System on 05/08/2021.   F/U in 1 month

## 2021-05-10 ENCOUNTER — Other Ambulatory Visit: Payer: Self-pay | Admitting: Family Medicine

## 2021-05-14 ENCOUNTER — Encounter: Payer: Self-pay | Admitting: Family Medicine

## 2021-05-14 ENCOUNTER — Ambulatory Visit (INDEPENDENT_AMBULATORY_CARE_PROVIDER_SITE_OTHER): Payer: Medicare HMO | Admitting: Family Medicine

## 2021-05-14 ENCOUNTER — Other Ambulatory Visit: Payer: Self-pay

## 2021-05-14 VITALS — BP 122/77 | HR 95 | Temp 98.9°F | Resp 16 | Ht 66.0 in | Wt 190.0 lb

## 2021-05-14 DIAGNOSIS — Z1159 Encounter for screening for other viral diseases: Secondary | ICD-10-CM | POA: Diagnosis not present

## 2021-05-14 DIAGNOSIS — Z23 Encounter for immunization: Secondary | ICD-10-CM

## 2021-05-14 DIAGNOSIS — E669 Obesity, unspecified: Secondary | ICD-10-CM

## 2021-05-14 DIAGNOSIS — F411 Generalized anxiety disorder: Secondary | ICD-10-CM

## 2021-05-14 DIAGNOSIS — G894 Chronic pain syndrome: Secondary | ICD-10-CM | POA: Diagnosis not present

## 2021-05-14 DIAGNOSIS — E559 Vitamin D deficiency, unspecified: Secondary | ICD-10-CM | POA: Diagnosis not present

## 2021-05-14 DIAGNOSIS — I10 Essential (primary) hypertension: Secondary | ICD-10-CM

## 2021-05-14 DIAGNOSIS — F5104 Psychophysiologic insomnia: Secondary | ICD-10-CM

## 2021-05-14 DIAGNOSIS — R7303 Prediabetes: Secondary | ICD-10-CM | POA: Diagnosis not present

## 2021-05-14 DIAGNOSIS — F419 Anxiety disorder, unspecified: Secondary | ICD-10-CM | POA: Diagnosis not present

## 2021-05-14 DIAGNOSIS — F322 Major depressive disorder, single episode, severe without psychotic features: Secondary | ICD-10-CM

## 2021-05-14 DIAGNOSIS — R7301 Impaired fasting glucose: Secondary | ICD-10-CM | POA: Diagnosis not present

## 2021-05-14 MED ORDER — PHENTERMINE HCL 37.5 MG PO TABS: ORAL_TABLET | ORAL | 2 refills | Status: DC

## 2021-05-14 MED ORDER — ALPRAZOLAM 0.25 MG PO TABS: ORAL_TABLET | ORAL | 3 refills | Status: DC

## 2021-05-14 NOTE — Assessment & Plan Note (Signed)
Sleep hygiene reviewed and written information offered also. Prescription sent for  medication needed.  

## 2021-05-14 NOTE — Assessment & Plan Note (Signed)
Improved, continue current medication dose

## 2021-05-14 NOTE — Patient Instructions (Addendum)
Annual exam October 8 or after call if you need me sooner.  Pneumonia 23 in office today.  Please get your Shingrix vaccines at the pharmacy in the next 2 to 4 weeks start the series.  This is a 2 part vaccine.  Labs today as ordered earlier this year nonfasting is fine  Dose reduction and Xanax to  half of a tablet at bedtime.  You are also referred for therapy.with Ms Malvin Johns  It is important that you exercise regularly at least 30 minutes 5 times a week. If you develop chest pain, have severe difficulty breathing, or feel very tired, stop exercising immediately and seek medical attention   Thanks for choosing  [facility], we consider it a privelige to serve you.    Nurse please document the PHQ 9 patient is entering

## 2021-05-14 NOTE — Assessment & Plan Note (Signed)
  Patient re-educated about  the importance of commitment to a  minimum of 150 minutes of exercise per week as able.  The importance of healthy food choices with portion control discussed, as well as eating regularly and within a 12 hour window most days. The need to choose "clean , green" food 50 to 75% of the time is discussed, as well as to make water the primary drink and set a goal of 64 ounces water daily.    Weight /BMI 05/14/2021 05/08/2021 04/09/2021  WEIGHT 190 lb 192 lb 12.8 oz 187 lb 3.2 oz  HEIGHT 5\' 6"  5\' 6"  5\' 6"   BMI 30.67 kg/m2 31.12 kg/m2 30.21 kg/m2  Some encounter information is confidential and restricted. Go to Review Flowsheets activity to see all data.    Continue half phentermine

## 2021-05-14 NOTE — Assessment & Plan Note (Signed)
Patient educated about the importance of limiting  Carbohydrate intake , the need to commit to daily physical activity for a minimum of 30 minutes , and to commit weight loss. The fact that changes in all these areas will reduce or eliminate all together the development of diabetes is stressed.   Diabetic Labs Latest Ref Rng & Units 04/06/2020 01/04/2020 12/06/2019 04/21/2019 02/04/2019  HbA1c 4.8 - 5.6 % 6.3(H) 6.4(H) - 5.8(H) -  Microalbumin 0.00 - 1.89 mg/dL - - - - -  Micro/Creat Ratio 0.0 - 30.0 mg/g - - - - -  Chol <200 mg/dL - - 195 183 -  HDL > OR = 50 mg/dL - - 55 56 -  Calc LDL mg/dL (calc) - - 117(H) 109(H) -  Triglycerides <150 mg/dL - - 124 86 -  Creatinine 0.57 - 1.00 mg/dL 1.05(H) - 1.03 1.00 1.06(H)  GFR >60.00 mL/min - - - - -   BP/Weight 05/14/2021 05/08/2021 04/09/2021 03/12/2021 03/12/2021 6/60/6301 6/0/1093  Systolic BP 235 573 220 254 270 623 -  Diastolic BP 77 72 75 71 71 76 -  Wt. (Lbs) 190 192.8 187.2 190 190 188.2 189  BMI 30.67 31.12 30.21 30.67 30.67 30.38 30.51  Some encounter information is confidential and restricted. Go to Review Flowsheets activity to see all data.   No flowsheet data found.

## 2021-05-14 NOTE — Assessment & Plan Note (Signed)
Controlled, no change in medication DASH diet and commitment to daily physical activity for a minimum of 30 minutes discussed and encouraged, as a part of hypertension management. The importance of attaining a healthy weight is also discussed.  BP/Weight 05/14/2021 05/08/2021 04/09/2021 03/12/2021 03/12/2021 01/13/8117 06/09/7736  Systolic BP 366 815 947 076 151 834 -  Diastolic BP 77 72 75 71 71 76 -  Wt. (Lbs) 190 192.8 187.2 190 190 188.2 189  BMI 30.67 31.12 30.21 30.67 30.67 30.38 30.51  Some encounter information is confidential and restricted. Go to Review Flowsheets activity to see all data.

## 2021-05-14 NOTE — Progress Notes (Signed)
   Jane English     MRN: [id number]      DOB: [birth year]   HPI Ms. Crookston is here for follow up and re-evaluation of chronic medical conditions, medication management and review of any available recent lab and radiology data.  Preventive health is updated, specifically  Cancer screening and Immunization.   Still having anxiety, but reduced xanax dose by half and thinks this ok Living with her ex spouse for the past 2 months, not certain whether she is in good   ROS Denies recent fever or chills. Denies sinus pressure, nasal congestion, ear pain or sore throat. Denies chest congestion, productive cough or wheezing. Denies chest pains, palpitations and leg swelling Denies abdominal pain, nausea, vomiting,diarrhea or constipation.   Denies dysuria, frequency, hesitancy or incontinence. Denies joint pain, swelling and limitation in mobility. Denies headaches, seizures, numbness, or tingling. . Denies skin break down or rash.   PE  BP 122/77   Pulse 95   Temp 98.9 F (37.2 C) (Oral)   Resp 16   Ht 5\' 6"  (1.676 m)   Wt 190 lb (86.2 kg)   SpO2 96%   BMI 30.67 kg/m   Patient alert and oriented and in no cardiopulmonary distress.  HEENT: No facial asymmetry, EOMI,     Neck supple .  Chest: Clear to auscultation bilaterally.  CVS: S1, S2 no murmurs, no S3.Regular rate.  ABD: Soft non tender.   Ext: No edema  MS: Adequate ROM spine, shoulders, hips and knees.  Skin: Intact, no ulcerations or rash noted.  Psych: Good eye contact, normal affect. Memory intact not anxious or depressed appearing.  CNS: CN 2-12 intact, power,  normal throughout.no focal deficits noted.   Assessment & Plan  Patient educated about the importance of limiting  Carbohydrate intake , the need to commit to daily physical activity for a minimum of 30 minutes , and to commit weight loss. The fact that changes in all these areas will reduce or eliminate all together the development of diabetes is  stressed.   Diabetic Labs Latest Ref Rng & Units 04/06/2020 01/04/2020 12/06/2019 04/21/2019 02/04/2019  HbA1c 4.8 - 5.6 % 6.3(H) 6.4(H) - 5.8(H) -  Microalbumin 0.00 - 1.89 mg/dL - - - - -  Micro/Creat Ratio 0.0 - 30.0 mg/g - - - - -  Chol <200 mg/dL - - 195 183 -  HDL > OR = 50 mg/dL - - 55 56 -  Calc LDL mg/dL (calc) - - 117(H) 109(H) -  Triglycerides <150 mg/dL - - 124 86 -  Creatinine 0.57 - 1.00 mg/dL 1.05(H) - 1.03 1.00 1.06(H)  GFR >60.00 mL/min - - - - -   BP/Weight 05/14/2021 05/08/2021 04/09/2021 03/12/2021 03/12/2021 6/71/2458 0/07/9832  Systolic BP 825 053 976 734 193 790 -  Diastolic BP 77 72 75 71 71 76 -  Wt. (Lbs) 190 192.8 187.2 190 190 188.2 189  BMI 30.67 31.12 30.21 30.67 30.67 30.38 30.51  Some encounter information is confidential and restricted. Go to Review Flowsheets activity to see all data.   No flowsheet data found.

## 2021-05-14 NOTE — Assessment & Plan Note (Signed)
Managed by pain managemnt

## 2021-05-14 NOTE — Assessment & Plan Note (Signed)
Not adequately treated, no med change, referred to therapy, not suicidal or homicdal

## 2021-05-14 NOTE — Assessment & Plan Note (Signed)
reduce xanax dose

## 2021-05-15 ENCOUNTER — Other Ambulatory Visit: Payer: Self-pay | Admitting: Family Medicine

## 2021-05-15 LAB — CMP14+EGFR
ALT: 31 IU/L (ref 0–32)
AST: 25 IU/L (ref 0–40)
Albumin/Globulin Ratio: 1.7 (ref 1.2–2.2)
Albumin: 4.7 g/dL (ref 3.8–4.9)
Alkaline Phosphatase: 81 IU/L (ref 44–121)
BUN/Creatinine Ratio: 12 (ref 9–23)
BUN: 11 mg/dL (ref 6–24)
Bilirubin Total: 0.4 mg/dL (ref 0.0–1.2)
CO2: 21 mmol/L (ref 20–29)
Calcium: 9.5 mg/dL (ref 8.7–10.2)
Chloride: 99 mmol/L (ref 96–106)
Creatinine, Ser: 0.95 mg/dL (ref 0.57–1.00)
Globulin, Total: 2.7 g/dL (ref 1.5–4.5)
Glucose: 103 mg/dL — ABNORMAL HIGH (ref 65–99)
Potassium: 3.9 mmol/L (ref 3.5–5.2)
Sodium: 139 mmol/L (ref 134–144)
Total Protein: 7.4 g/dL (ref 6.0–8.5)
eGFR: 72 mL/min/{1.73_m2} (ref >59)

## 2021-05-15 LAB — CBC
Hematocrit: 38.3 % (ref 34.0–46.6)
Hemoglobin: 13.4 g/dL (ref 11.1–15.9)
MCH: 30.9 pg (ref 26.6–33.0)
MCHC: 35 g/dL (ref 31.5–35.7)
MCV: 89 fL (ref 79–97)
Platelets: 298 10*3/uL (ref 150–450)
RBC: 4.33 x10E6/uL (ref 3.77–5.28)
RDW: 13.5 % (ref 11.7–15.4)
WBC: 6.2 10*3/uL (ref 3.4–10.8)

## 2021-05-15 LAB — LIPID PANEL
Chol/HDL Ratio: 3.5 ratio (ref 0.0–4.4)
Cholesterol, Total: 187 mg/dL (ref 100–199)
HDL: 53 mg/dL (ref >39)
LDL Chol Calc (NIH): 104 mg/dL — ABNORMAL HIGH (ref 0–99)
Triglycerides: 172 mg/dL — ABNORMAL HIGH (ref 0–149)
VLDL Cholesterol Cal: 30 mg/dL (ref 5–40)

## 2021-05-15 LAB — HEMOGLOBIN A1C
Est. average glucose Bld gHb Est-mCnc: 140 mg/dL
Hgb A1c MFr Bld: 6.5 % — ABNORMAL HIGH (ref 4.8–5.6)

## 2021-05-15 LAB — TSH: TSH: 1.02 u[IU]/mL (ref 0.450–4.500)

## 2021-05-15 LAB — VITAMIN D 25 HYDROXY (VIT D DEFICIENCY, FRACTURES): Vit D, 25-Hydroxy: 20.8 ng/mL — ABNORMAL LOW (ref 30.0–100.0)

## 2021-05-15 LAB — HEPATITIS C ANTIBODY: Hep C Virus Ab: <0.1 s/co ratio (ref 0.0–0.9)

## 2021-05-15 MED ORDER — ERGOCALCIFEROL 1.25 MG (50000 UT) PO CAPS: 50000.0000 [IU] | ORAL_CAPSULE | ORAL | 2 refills | Status: DC

## 2021-05-23 ENCOUNTER — Telehealth: Payer: Self-pay | Admitting: Internal Medicine

## 2021-05-23 NOTE — Telephone Encounter (Signed)
Inbound call from patient requesting enough refills for Linzess and Amitiza medications be [facility] to [facility] [address] in [place] until she comes in for her appt scheduled for 06/25/21 please.

## 2021-05-23 NOTE — Telephone Encounter (Signed)
Dr Hilarie Fredrickson- Patient requesting refill on Amitiza and Linzess. However, she has not been seen in over 3 years (03/2018) and from what I can tell hasnt been on either Amitiza or Linzess for over 1 year. Do you want me refill meds as patient requests or have her come in office first at her scheduled visit 06/25/21?

## 2021-05-27 NOTE — Telephone Encounter (Signed)
Can refill but only until scheduled office visit JMP

## 2021-05-28 MED ORDER — LINACLOTIDE 290 MCG PO CAPS: ORAL_CAPSULE | ORAL | 0 refills | Status: DC

## 2021-05-28 NOTE — Telephone Encounter (Signed)
Rx sent until 06/2021 office visit.

## 2021-06-10 ENCOUNTER — Other Ambulatory Visit: Payer: Self-pay | Admitting: Family Medicine

## 2021-06-10 ENCOUNTER — Other Ambulatory Visit: Payer: Self-pay | Admitting: Internal Medicine

## 2021-06-11 ENCOUNTER — Other Ambulatory Visit: Payer: Self-pay | Admitting: Internal Medicine

## 2021-06-11 ENCOUNTER — Encounter: Payer: Self-pay | Admitting: Registered Nurse

## 2021-06-11 ENCOUNTER — Encounter: Payer: Medicare HMO | Attending: Physical Medicine and Rehabilitation | Admitting: Registered Nurse

## 2021-06-11 ENCOUNTER — Other Ambulatory Visit: Payer: Self-pay

## 2021-06-11 VITALS — BP 108/72 | HR 81 | Temp 98.1°F | Ht 66.0 in | Wt 188.6 lb

## 2021-06-11 DIAGNOSIS — Z79891 Long term (current) use of opiate analgesic: Secondary | ICD-10-CM | POA: Diagnosis not present

## 2021-06-11 DIAGNOSIS — Y92009 Unspecified place in unspecified non-institutional (private) residence as the place of occurrence of the external cause: Secondary | ICD-10-CM | POA: Diagnosis not present

## 2021-06-11 DIAGNOSIS — M961 Postlaminectomy syndrome, not elsewhere classified: Secondary | ICD-10-CM

## 2021-06-11 DIAGNOSIS — W19XXXD Unspecified fall, subsequent encounter: Secondary | ICD-10-CM

## 2021-06-11 DIAGNOSIS — M94262 Chondromalacia, left knee: Secondary | ICD-10-CM | POA: Diagnosis not present

## 2021-06-11 DIAGNOSIS — M62838 Other muscle spasm: Secondary | ICD-10-CM | POA: Diagnosis not present

## 2021-06-11 DIAGNOSIS — G8929 Other chronic pain: Secondary | ICD-10-CM

## 2021-06-11 DIAGNOSIS — M94261 Chondromalacia, right knee: Secondary | ICD-10-CM

## 2021-06-11 DIAGNOSIS — Z760 Encounter for issue of repeat prescription: Secondary | ICD-10-CM | POA: Diagnosis not present

## 2021-06-11 DIAGNOSIS — Z5181 Encounter for therapeutic drug level monitoring: Secondary | ICD-10-CM

## 2021-06-11 DIAGNOSIS — M545 Low back pain, unspecified: Secondary | ICD-10-CM

## 2021-06-11 DIAGNOSIS — G894 Chronic pain syndrome: Secondary | ICD-10-CM

## 2021-06-11 MED ORDER — OXYCODONE HCL 5 MG PO TABS: 10.0000 mg | ORAL_TABLET | Freq: Two times a day (BID) | ORAL | 0 refills | Status: DC
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
## 2021-06-14 LAB — TOXASSURE SELECT,+ANTIDEPR,UR

## 2021-06-18 ENCOUNTER — Telehealth: Payer: Self-pay | Admitting: *Deleted

## 2021-06-18 NOTE — Telephone Encounter (Signed)
Urine drug screen for this encounter is consistent for prescribed medication 

## 2021-06-25 ENCOUNTER — Encounter: Payer: Self-pay | Admitting: Internal Medicine

## 2021-06-25 ENCOUNTER — Ambulatory Visit (INDEPENDENT_AMBULATORY_CARE_PROVIDER_SITE_OTHER): Payer: Medicare HMO | Admitting: Internal Medicine

## 2021-06-25 VITALS — BP 108/60 | HR 72 | Ht 66.0 in | Wt 187.0 lb

## 2021-06-25 DIAGNOSIS — Z6830 Body mass index (BMI) 30.0-30.9, adult: Secondary | ICD-10-CM | POA: Diagnosis not present

## 2021-06-25 DIAGNOSIS — E669 Obesity, unspecified: Secondary | ICD-10-CM

## 2021-06-25 DIAGNOSIS — K581 Irritable bowel syndrome with constipation: Secondary | ICD-10-CM

## 2021-06-25 DIAGNOSIS — K219 Gastro-esophageal reflux disease without esophagitis: Secondary | ICD-10-CM | POA: Diagnosis not present

## 2021-06-25 DIAGNOSIS — K5909 Other constipation: Secondary | ICD-10-CM

## 2021-06-25 MED ORDER — LUBIPROSTONE 24 MCG PO CAPS: ORAL_CAPSULE | ORAL | 0 refills | Status: DC

## 2021-06-25 MED ORDER — LINACLOTIDE 290 MCG PO CAPS: ORAL_CAPSULE | ORAL | 0 refills | Status: DC

## 2021-06-25 MED ORDER — PANTOPRAZOLE SODIUM 40 MG PO TBEC: 40.0000 mg | DELAYED_RELEASE_TABLET | Freq: Every day | ORAL | 0 refills | Status: DC

## 2021-06-25 NOTE — Progress Notes (Signed)
Patient ID: Jane English, female   DOB: Aug 24, 1971, 50 y.o.   MRN: [id number] HPI: Jane English is a 50 year old female with a history of chronic constipation with IBS, history of abdominal adhesive disease, GERD, previously treated H. pylori, chronic pain, bipolar depression and hypertension who is here for follow-up.  She has not been seen in over 3 years last having an appointment in May 2019.  She is here alone today.  She reports that she continues to have issues with abdominal bloating, cramping and a "hard knots" particularly in her left abdomen.  This feels like a pressure sensation at times feels better if she lies down.  This is not a new pain.  Can be worse if she sneezes or coughs.  She still feels constipated and has ran out of Linzess and Amitiza.  Previously she was using Linzess, [place] and MiraLAX.  Recently she has been using MiraLAX and fiber.  No blood in stool or melena.  She has gained weight and wishes to lose weight.  She reports she feels heavy which makes her feel tired and depressed.  Her sleep is poor.  She has a hard time staying asleep.  She has discussed Mantyh obesity procedures with her primary care provider Dr. Moshe Cipro.  She continues on pantoprazole 40 mg daily.  This helps with belching and dyspeptic symptoms.  Past Medical History:  Diagnosis Date   Anemia    Asthma    Asthma flare 04/09/2013   Back pain    Bronchitis    Chronic abdominal pain    Chronic constipation    Constipation due to opioid therapy    Depression    Depression, major, single episode, severe ([place]) 10/03/2018   PHQ 9 score of 15   Diabetes mellitus without complication ([place])    Diabetes mellitus, type II ([place])    DVT (deep venous thrombosis) ([place]) 2010   GERD (gastroesophageal reflux disease)    Heart murmur    no cardiologist   Helicobacter pylori gastritis 06/11/2013   Colonoscopy Dr. Hilarie Fredrickson   Hypertension    IBS (irritable bowel syndrome)    Migraine headache    Neuropathy     Obesity    Obsessive-compulsive disorder    PSYCHOTIC D/O W/HALLUCINATIONS CONDS CLASS ELSW 03/04/2010   Qualifier: Diagnosis of  By: Moshe Cipro MD, Margaret     PTSD (post-traumatic stress disorder)    SBO (small bowel obstruction) ([place]) 08/09/2013   Seasonal allergies 12/10/2012   Seizures ([place])    Shortness of breath     Past Surgical History:  Procedure Laterality Date   ANTERIOR CERVICAL DECOMP/DISCECTOMY FUSION  07/07/2012   Procedure: ANTERIOR CERVICAL DECOMPRESSION/DISCECTOMY FUSION 2 LEVELS;  Surgeon: Floyce Stakes, MD;  Location: [facility];  Service: Neurosurgery;  Laterality: N/A;  Cervical four-five, five - six  Anterior cervical decompression/diskectomy/fusion/plate   APPENDECTOMY  1986   BOWEL RESECTION N/A 07/29/2013   Procedure: serosal repair;  Surgeon: Adin Hector, MD;  Location: [facility];  Service: General;  Laterality: N/A;   CARPAL TUNNEL RELEASE Bilateral    COLON SURGERY N/A    [employer] 07/29/2020   LAPAROSCOPY N/A 07/29/2013   Procedure: diagnostic laporoscopy;  Surgeon: Adin Hector, MD;  Location: [facility];  Service: General;  Laterality: N/A;   LAPAROSCOPY N/A 08/16/2013   Procedure: LAPAROSCOPY DIAGNOSTIC/LYSIS OF ADHESIONS;  Surgeon: Adin Hector, MD;  Location: [facility];  Service: General;  Laterality: N/A;   LAPAROTOMY N/A 08/16/2013   Procedure: EXPLORATORY LAPAROTOMY/SMALL  BOWEL RESECTION (JEJUNUM);  Surgeon: Adin Hector, MD;  Location: [facility];  Service: General;  Laterality: N/A;   LUMBAR SPINE SURGERY  2010   x 3   LYSIS OF ADHESION  2003   Dr. Irving Shows   LYSIS OF ADHESION N/A 07/29/2013   Procedure: LYSIS OF ADHESION;  Surgeon: Adin Hector, MD;  Location: [facility];  Service: General;  Laterality: N/A;   [place]?   Linna Hoff, [place]   SPINAL CORD STIMULATOR IMPLANT     SPINE SURGERY N/A    [employer]rees[employer] 07/29/2020   TRIGGER FINGER RELEASE  2009   right pinkie finger   TUBAL LIGATION  1994    Outpatient  Medications Prior to Visit  Medication Sig Dispense Refill   albuterol (PROVENTIL) (2.5 MG/3ML) 0.083% nebulizer solution Take 3 mLs (2.5 mg total) by nebulization every 6 (six) hours as needed for wheezing or shortness of breath. 150 mL 1   albuterol (VENTOLIN HFA) 108 (90 Base) MCG/ACT inhaler TAKE 2 PUFFS BY MOUTH EVERY 6 HOURS AS NEEDED FOR WHEEZE OR SHORTNESS OF BREATH 18 g 4   ALPRAZolam (XANAX) 0.25 MG tablet Take half tablet by mouth at bedtime for sleep and anxiety 15 tablet 3   amitriptyline (ELAVIL) 150 MG tablet TAKE HALF A TABLET (75 MG TOTAL) AT BEDTIME 30 tablet 1   azelastine (ASTELIN) 0.1 % nasal spray PLACE 2 SPRAYS INTO BOTH NOSTRILS 2 (TWO) TIMES DAILY. USE IN EACH NOSTRIL AS DIRECTED 30 mL 4   beclomethasone (QVAR REDIHALER) 40 MCG/ACT inhaler Inhale 2 puffs into the lungs 2 (two) times daily. 10.6 g 5   cloNIDine (CATAPRES) 0.1 MG tablet TAKE 1 TABLET BY MOUTH EVERYDAY AT BEDTIME 90 tablet 1   clotrimazole-betamethasone (LOTRISONE) cream APPLY TO AFFECTED AREA TWICE A DAY 45 g 5   cyclobenzaprine (FLEXERIL) 5 MG tablet Take 1 tablet (5 mg total) by mouth 3 (three) times daily as needed for muscle spasms. 30 tablet 3   diclofenac Sodium (VOLTAREN) 1 % GEL APPLY 2 GRAMS TO AFFECTED AREA 4 TIMES A DAY 100 g 4   EPINEPHrine 0.3 mg/0.3 mL IJ SOAJ injection as needed.     ergocalciferol (VITAMIN D2) 1.25 MG (50000 UT) capsule Take 1 capsule (50,000 Units total) by mouth once a week. One capsule once weekly 12 capsule 2   fexofenadine-pseudoephedrine (ALLEGRA-D 24) 180-240 MG 24 hr tablet Take 1 tablet by mouth daily.     FLUoxetine (PROZAC) 40 MG capsule Take 1 capsule (40 mg total) by mouth daily. 30 capsule 3   fluticasone (FLONASE) 50 MCG/ACT nasal spray Place 1-2 sprays into both nostrils daily. 16 g 2   fluticasone furoate-vilanterol (BREO ELLIPTA) 100-25 MCG/INH AEPB Inhale 1 puff into the lungs 2 (two) times daily. 28 each 1   hydrOXYzine (ATARAX/VISTARIL) 10 MG tablet TAKE  ONE TABLET BY MOUTH THREE TIMES DAILY FOR ANXIETY 90 tablet 1   lidocaine (XYLOCAINE) 2 % jelly      magnesium oxide (MAG-OX) 400 MG tablet TAKE 1 TABLET BY MOUTH EVERYDAY AT BEDTIME (Patient taking differently: Take 400 mg by mouth daily as needed.) 90 tablet 3   meloxicam (MOBIC) 15 MG tablet Take 1 tablet (15 mg total) by mouth daily. 14 tablet 0   montelukast (SINGULAIR) 10 MG tablet TAKE 1 TABLET BY MOUTH EVERYDAY AT BEDTIME 90 tablet 0   NONFORMULARY OR COMPOUNDED ITEM Apply 1-2 pumps, 3-4 times daily as needed. 1 each 0  olopatadine (PATANOL) 0.1 % ophthalmic solution INSTILL 1 DROP INTO BOTH EYES TWICE A DAY 5 mL 12   oxyCODONE (ROXICODONE) 5 MG immediate release tablet Take 2 tablets (10 mg total) by mouth 2 (two) times daily. 120 tablet 0   phentermine (ADIPEX-P) 37.5 MG tablet Take half tablet once daily by mouth at breakfast 30 tablet 0   Tiotropium Bromide Monohydrate (SPIRIVA RESPIMAT) 1.25 MCG/ACT AERS Inhale 2 puffs into the lungs daily. 4 g 5   triamterene-hydrochlorothiazide (MAXZIDE-25) 37.5-25 MG tablet TAKE 1 TABLET BY MOUTH EVERY DAY 90 tablet 1   trolamine salicylate (ASPERCREME) 10 % cream Apply 1 application topically as needed for muscle pain.      cetirizine (ZYRTEC) 10 MG tablet Take 1 tablet (10 mg total) by mouth daily. 30 tablet 11   linaclotide (LINZESS) 290 MCG CAPS capsule TAKE 1 CAPSULE (290 MCG TOTAL) BY MOUTH DAILY BEFORE BREAKFAST. MUST KEEP 06/2021 OFFICE VISIT FOR FURTHER REFILLS 30 capsule 0   pantoprazole (PROTONIX) 40 MG tablet TAKE 1 TABLET BY MOUTH EVERY DAY 90 tablet 1   NARCAN 4 MG/0.1ML LIQD nasal spray kit Place 4 mg into the nose as directed.  1   solifenacin (VESICARE) 5 MG tablet Take 1 tablet (5 mg total) by mouth daily. 90 tablet 1   AMITIZA 24 MCG capsule TAKE 1 CAPSULE (24 MCG TOTAL) BY MOUTH 2 (TWO) TIMES DAILY. (Patient not taking: Reported on 06/25/2021) 60 capsule 0   lidocaine (LIDODERM) 5 % Place 2 patches onto the skin every 12 (twelve)  hours. Remove & Discard patch within 12 hours or as directed by MD 10 patch 0   Facility-Administered Medications Prior to Visit  Medication Dose Route Frequency Provider Last Rate Last Admin   Mepolizumab SOLR 100 mg  100 mg Subcutaneous Q28 days Garnet Sierras, DO   100 mg at 06/23/20 1105    Allergies  Allergen Reactions   Neurontin [Gabapentin] Nausea And Vomiting and Other (See Comments)    Sleep walking/hallucinations    Penicillins Hives, Shortness Of Breath and Other (See Comments)    Has patient had a PCN reaction causing immediate rash, facial/tongue/throat swelling, SOB or lightheadedness with hypotension: Yes Has patient had a PCN reaction causing severe rash involving mucus membranes or skin necrosis: No Has patient had a PCN reaction that required hospitalization: No Has patient had a PCN reaction occurring within the last 10 years: Yes If all of the above answers are "NO", then may proceed with Cephalosporin use.   Pregabalin Anaphylaxis, Shortness Of Breath, Diarrhea and Swelling    Lyrica   Tramadol Other (See Comments)    Makes patient "delusional"   Latex Rash    Family History  Problem Relation Age of Onset   Lung cancer Father    Stomach cancer Father    Esophageal cancer Father    Alcohol abuse Father    Mental illness Father    Diabetes Sister    Hypertension Sister    Bipolar disorder Sister    Schizophrenia Sister    Diabetes Sister    Alcohol abuse Brother    Hypertension Brother    Kidney disease Brother    Diabetes Brother    Drug abuse Brother    Mental illness Brother    Alcohol abuse Brother    Alcohol abuse Brother    Hypertension Brother    Diabetes Brother    Alcohol abuse Brother    Physical abuse Mother    Alcohol abuse Mother  Cirrhosis Mother    Mental illness Brother        in [place]   Drug abuse Sister    HIV Sister    Alcohol abuse Brother    Pneumonia Sister        died as a baby   Alcohol abuse Brother    Bipolar  disorder Brother    Bipolar disorder Daughter    Bipolar disorder Son    Bipolar disorder Son    Hypertension Brother    Bipolar disorder Brother    Drug abuse Brother    Alcohol abuse Brother    Bipolar disorder Brother    ADD / ADHD Neg Hx    Anxiety disorder Neg Hx    Dementia Neg Hx    Depression Neg Hx    OCD Neg Hx    Seizures Neg Hx    Paranoid behavior Neg Hx    Colon cancer Neg Hx     Social History   Tobacco Use   Smoking status: Never   Smokeless tobacco: Never  Vaping Use   Vaping Use: Never used  Substance Use Topics   Alcohol use: No   Drug use: No    ROS: As per history of present illness, otherwise negative  BP 108/60   Pulse 72   Ht 5' 6" (1.676 m)   Wt 187 lb (84.8 kg)   SpO2 98%   BMI 30.18 kg/m  Gen: awake, alert, NAD HEENT: anicteric, op clear CV: RRR, no mrg Pulm: CTA b/l Abd: soft, NT/ND, +BS throughout Ext: no c/c/e Neuro: nonfocal   RELEVANT LABS AND IMAGING: CBC    Component Value Date/Time   WBC 6.2 05/14/2021 1547   WBC 6.3 02/04/2019 1349   RBC 4.33 05/14/2021 1547   RBC 4.42 02/04/2019 1349   HGB 13.4 05/14/2021 1547   HCT 38.3 05/14/2021 1547   PLT 298 05/14/2021 1547   MCV 89 05/14/2021 1547   MCH 30.9 05/14/2021 1547   MCH 30.3 02/04/2019 1349   MCHC 35.0 05/14/2021 1547   MCHC 32.4 02/04/2019 1349   RDW 13.5 05/14/2021 1547   LYMPHSABS 2.8 04/06/2020 1629   MONOABS 0.5 02/04/2019 1349   EOSABS 0.5 (H) 04/06/2020 1629   BASOSABS 0.1 04/06/2020 1629    CMP     Component Value Date/Time   NA 139 05/14/2021 1547   K 3.9 05/14/2021 1547   CL 99 05/14/2021 1547   CO2 21 05/14/2021 1547   GLUCOSE 103 (H) 05/14/2021 1547   GLUCOSE 100 (H) 12/06/2019 1508   BUN 11 05/14/2021 1547   CREATININE 0.95 05/14/2021 1547   CREATININE 1.03 12/06/2019 1508   CALCIUM 9.5 05/14/2021 1547   PROT 7.4 05/14/2021 1547   ALBUMIN 4.7 05/14/2021 1547   AST 25 05/14/2021 1547   ALT 31 05/14/2021 1547   ALKPHOS 81  05/14/2021 1547   BILITOT 0.4 05/14/2021 1547   GFRNONAA 62 04/06/2020 1630   GFRNONAA 63 12/06/2019 1508   GFRAA 71 04/06/2020 1630   GFRAA 73 12/06/2019 1508    ASSESSMENT/PLAN: 50 year old female with a history of chronic constipation with IBS, history of abdominal adhesive disease, GERD, previously treated H. pylori, chronic pain, bipolar depression and hypertension who is here for follow-up.   Chronic constipation --longstanding issue for her and relates to IBS but probably to some degree due to abdominal adhesions.  Previously on 3 laxatives but most recently off of Amitiza and Linzess.  Constipation worse of late --Resume Linzess 290 mcg  daily and Amitiza 24 mcg twice daily --Can continue MiraLAX titrated, 17 g 1-2 times daily as needed  2.  GERD/dyspepsia --continue pantoprazole 40 mg daily  3.  Obesity --BMI is 30.18.  She has entertained possibly having surgery such as a Lap-Band placement.  We discussed this today.  Some of her abdominal bloating may relate to #1 and so we will see how she feels once we adequately improve her constipation.  We discussed how surgery is possible though I am not sure she has fully considered this at present.  She may be a candidate for Ozempic/Wegovy which can in some patients lead to meaningful weight loss.  She can discuss this further with Dr. Moshe Cipro.  4.  CRC screening --colonoscopy without adenomatous polyp in August 2014, repeat August 2024  3 to 4 month follow-up, sooner if needed      NT:[id number], [address], [place], [place],  [place] [id number]

## 2021-06-25 NOTE — Progress Notes (Signed)
   Subjective:    Patient ID: Jane English, female    DOB: 06/18/1971, 50 y.o.   MRN: [id number]  HPI See other note   Review of Systems     Objective:   Physical Exam        Assessment & Plan:

## 2021-06-25 NOTE — Patient Instructions (Addendum)
Please talk to your primary care provider and ask about the possibility of using [facility] for weight loss.  We have sent the following medications to your pharmacy for you to pick up at your convenience: Pantoprazole Linzess Amitiza  Continue Miralax.  Please follow up with Dr Hilarie Fredrickson on 09/25/21 at 1:30 pm.  If you are age 51 or older, your body mass index should be between 23-30. Your Body mass index is 30.18 kg/m. If this is out of the aforementioned range listed, please consider follow up with your Primary Care Provider.  If you are age 49 or younger, your body mass index should be between 19-25. Your Body mass index is 30.18 kg/m. If this is out of the aformentioned range listed, please consider follow up with your Primary Care Provider.   __________________________________________________________  The [place] [facility] providers would like to encourage you to use [facility] to communicate with providers for non-urgent requests or questions.  Due to long hold times on the telephone, sending your provider a message by [facility] may be a faster and more efficient way to get a response.  Please allow 48 business hours for a response.  Please remember that this is for non-urgent requests.   Due to recent changes in healthcare laws, you may see the results of your imaging and laboratory studies on MyChart before your provider has had a chance to review them.  We understand that in some cases there may be results that are confusing or concerning to you. Not all laboratory results come back in the same time frame and the provider may be waiting for multiple results in order to interpret others.  Please give [place] 48 hours in order for your provider to thoroughly review all the results before contacting the office for clarification of your results.

## 2021-07-05 ENCOUNTER — Other Ambulatory Visit: Payer: Self-pay | Admitting: Family Medicine

## 2021-07-07 ENCOUNTER — Telehealth: Payer: Self-pay | Admitting: *Deleted

## 2021-07-07 NOTE — Telephone Encounter (Signed)
We have received a faxed response from [facility] indicating that patient's lubiprostone 24 mcg has been approved through 11/03/21.

## 2021-07-11 ENCOUNTER — Encounter (HOSPITAL_COMMUNITY): Payer: Self-pay | Admitting: Psychiatry

## 2021-07-11 ENCOUNTER — Ambulatory Visit (INDEPENDENT_AMBULATORY_CARE_PROVIDER_SITE_OTHER): Payer: Medicare HMO | Admitting: Psychiatry

## 2021-07-11 ENCOUNTER — Other Ambulatory Visit: Payer: Self-pay

## 2021-07-11 DIAGNOSIS — F330 Major depressive disorder, recurrent, mild: Secondary | ICD-10-CM
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
## 2021-07-11 NOTE — Progress Notes (Signed)
Subjective:    Patient ID: Jane English, female    DOB: [birth year], 50 y.o.   MRN: [id number]  HPI: Jane English is a 50 y.o. female who returns for follow-up appointment for chronic pain in her back secondary to failed back syndrome, bilateral knees secondary to chonrdomalacia of bilateral knees, pain in bilateral hands secondary to osteoarthritis, oxycodone refill.   1) Failed back syndrome -Her current exercise regime is walking and performing stretching exercises. -Last UDS was Performed on 01/26/2021, it was consistent.  -She has been taking oxycodone PRN (30MME) for years- it is helpful for her pain -she has had negative responses with Gabapentin and Lyrica.  -pain has been really severe recently.  -she has never tried Cymbalta.   2) Chondromalacia of bilateral knees  3) Anxiety -she receives Alprazolam from Dr. Moshe Cipro, PCP- dose was recently reduced in half.  [name] has discussed the black box warning of using opioids and benzodiazepines and highlighted the dangers of using these drugs together and discussed the adverse events including respiratory suppression, overdose, cognitive impairment and importance of compliance with current regimen.  -she has been referred to therapy by her PCP.   4) Osteoarthritis of bilateral hands  5) Right buttock pain  6) Myofascial pain  7) Chronic constipation: -has followed with GI since last visit  8) Prediabetes -she was advised by GI to discuss Wegovy vs. Ozempic with her PCP for weight loss- will also help with diabetes.  -there is plan for repeat colostomy- she had a bad experience with this last time.   9) Nausea: she does feel some some nausea with her pain medications.     Pain Inventory Average Pain 10 Pain Right Now 8 My pain is intermittent, sharp, burning, dull, stabbing, tingling, and aching  In the last 24 hours, has pain interfered with the following? General activity 8 Relation with others 9 Enjoyment of life  10 What TIME of day is your pain at its worst? daytime and evening Sleep (in general) Fair  Pain is worse with: walking, bending, sitting, and standing Pain improves with: rest, heat/ice, and medication Relief from Meds: 48  Family History  Problem Relation Age of Onset   Lung cancer Father    Stomach cancer Father    Esophageal cancer Father    Alcohol abuse Father    Mental illness Father    Diabetes Sister    Hypertension Sister    Bipolar disorder Sister    Schizophrenia Sister    Diabetes Sister    Alcohol abuse Brother    Hypertension Brother    Kidney disease Brother    Diabetes Brother    Drug abuse Brother    Mental illness Brother    Alcohol abuse Brother    Alcohol abuse Brother    Hypertension Brother    Diabetes Brother    Alcohol abuse Brother    Physical abuse Mother    Alcohol abuse Mother    Cirrhosis Mother    Mental illness Brother        in [place]   Drug abuse Sister    HIV Sister    Alcohol abuse Brother    Pneumonia Sister        died as a baby   Alcohol abuse Brother    Bipolar disorder Brother    Bipolar disorder Daughter    Bipolar disorder Son    Bipolar disorder Son    Hypertension Brother    Bipolar disorder Brother  Drug abuse Brother    Alcohol abuse Brother    Bipolar disorder Brother    ADD / ADHD Neg Hx    Anxiety disorder Neg Hx    Dementia Neg Hx    Depression Neg Hx    OCD Neg Hx    Seizures Neg Hx    Paranoid behavior Neg Hx    Colon cancer Neg Hx    Social History   Socioeconomic History   Marital status: Married    Spouse name: Not on file   Number of children: 4   Years of education: Not on file   Highest education level: Not on file  Occupational History   Not on file  Tobacco Use   Smoking status: Never   Smokeless tobacco: Never  Vaping Use   Vaping Use: Never used  Substance and Sexual Activity   Alcohol use: No   Drug use: No   Sexual activity: Not Currently    Partners: Male    Birth  control/protection: Surgical    Comment: tubal  Other Topics Concern   Not on file  Social History Narrative   Not on file   Social Determinants of Health   Financial Resource Strain: Low Risk    Difficulty of Paying Living Expenses: Not very hard  Food Insecurity: No Food Insecurity   Worried About Running Out of Food in the Last Year: Never true   Ran Out of Food in the Last Year: Never true  Transportation Needs: No Transportation Needs   Lack of Transportation (Medical): No   Lack of Transportation (Non-Medical): No  Physical Activity: Insufficiently Active   Days of Exercise per Week: 4 days   Minutes of Exercise per Session: 30 min  Stress: Stress Concern Present   Feeling of Stress : To some extent  Social Connections: Socially Isolated   Frequency of Communication with Friends and Family: Three times a week   Frequency of Social Gatherings with Friends and Family: Twice a week   Attends Religious Services: Never   Marine scientist or Organizations: No   Attends Music therapist: Never   Marital Status: Separated   Past Surgical History:  Procedure Laterality Date   ANTERIOR CERVICAL DECOMP/DISCECTOMY FUSION  07/07/2012   Procedure: ANTERIOR CERVICAL DECOMPRESSION/DISCECTOMY FUSION 2 LEVELS;  Surgeon: Floyce Stakes, MD;  Location: [facility];  Service: Neurosurgery;  Laterality: N/A;  Cervical four-five, five - six  Anterior cervical decompression/diskectomy/fusion/plate   APPENDECTOMY  1986   BOWEL RESECTION N/A 07/29/2013   Procedure: serosal repair;  Surgeon: Adin Hector, MD;  Location: [facility];  Service: General;  Laterality: N/A;   CARPAL TUNNEL RELEASE Bilateral    COLON SURGERY N/A    [employer] 07/29/2020   LAPAROSCOPY N/A 07/29/2013   Procedure: diagnostic laporoscopy;  Surgeon: Adin Hector, MD;  Location: [facility];  Service: General;  Laterality: N/A;   LAPAROSCOPY N/A 08/16/2013   Procedure: LAPAROSCOPY DIAGNOSTIC/LYSIS OF ADHESIONS;   Surgeon: Adin Hector, MD;  Location: [facility];  Service: General;  Laterality: N/A;   LAPAROTOMY N/A 08/16/2013   Procedure: EXPLORATORY LAPAROTOMY/SMALL BOWEL RESECTION (JEJUNUM);  Surgeon: Adin Hector, MD;  Location: [facility];  Service: General;  Laterality: N/A;   LUMBAR SPINE SURGERY  2010   x 3   LYSIS OF ADHESION  2003   Dr. Irving Shows   LYSIS OF ADHESION N/A 07/29/2013   Procedure: LYSIS OF ADHESION;  Surgeon: Adin Hector, MD;  Location:  [facility];  Service: General;  Laterality: N/A;   OOPHORECTOMY     PARTIAL HYSTERECTOMY  1990s?   [place], [place]   SPINAL CORD STIMULATOR IMPLANT     SPINE SURGERY N/A    [facility] 07/29/2020   TRIGGER FINGER RELEASE  2009   right pinkie finger   TUBAL LIGATION  1994   Past Surgical History:  Procedure Laterality Date   ANTERIOR CERVICAL DECOMP/DISCECTOMY FUSION  07/07/2012   Procedure: ANTERIOR CERVICAL DECOMPRESSION/DISCECTOMY FUSION 2 LEVELS;  Surgeon: Floyce Stakes, MD;  Location: [facility];  Service: Neurosurgery;  Laterality: N/A;  Cervical four-five, five - six  Anterior cervical decompression/diskectomy/fusion/plate   APPENDECTOMY  1986   BOWEL RESECTION N/A 07/29/2013   Procedure: serosal repair;  Surgeon: Adin Hector, MD;  Location: [facility];  Service: General;  Laterality: N/A;   CARPAL TUNNEL RELEASE Bilateral    COLON SURGERY N/A    [facility] 07/29/2020   LAPAROSCOPY N/A 07/29/2013   Procedure: diagnostic laporoscopy;  Surgeon: Adin Hector, MD;  Location: [facility];  Service: General;  Laterality: N/A;   LAPAROSCOPY N/A 08/16/2013   Procedure: LAPAROSCOPY DIAGNOSTIC/LYSIS OF ADHESIONS;  Surgeon: Adin Hector, MD;  Location: [facility];  Service: General;  Laterality: N/A;   LAPAROTOMY N/A 08/16/2013   Procedure: EXPLORATORY LAPAROTOMY/SMALL BOWEL RESECTION (JEJUNUM);  Surgeon: Adin Hector, MD;  Location: [facility];  Service: General;  Laterality: N/A;   LUMBAR SPINE SURGERY  2010   x 3   LYSIS OF ADHESION  2003   Dr.  Irving Shows   LYSIS OF ADHESION N/A 07/29/2013   Procedure: LYSIS OF ADHESION;  Surgeon: Adin Hector, MD;  Location: [facility];  Service: General;  Laterality: N/A;   [place]?   [place], [place]   SPINAL CORD STIMULATOR IMPLANT     SPINE SURGERY N/A    [facility] 07/29/2020   TRIGGER FINGER RELEASE  2009   right pinkie finger   TUBAL LIGATION  1994   Past Medical History:  Diagnosis Date   Anemia    Asthma    Asthma flare 04/09/2013   Back pain    Bronchitis    Chronic abdominal pain    Chronic constipation    Constipation due to opioid therapy    Depression    Depression, major, single episode, severe ([place]) 10/03/2018   PHQ 9 score of 15   Diabetes mellitus without complication ([place])    Diabetes mellitus, type II ([place])    DVT (deep venous thrombosis) ([place]) 2010   GERD (gastroesophageal reflux disease)    Heart murmur    no cardiologist   Helicobacter pylori gastritis 06/11/2013   Colonoscopy Dr. Hilarie Fredrickson   Hypertension    IBS (irritable bowel syndrome)    Migraine headache    Neuropathy    Obesity    Obsessive-compulsive disorder    PSYCHOTIC D/O W/HALLUCINATIONS CONDS CLASS ELSW 03/04/2010   Qualifier: Diagnosis of  By: Moshe Cipro MD, Margaret     PTSD (post-traumatic stress disorder)    SBO (small bowel obstruction) ([place]) 08/09/2013   Seasonal allergies 12/10/2012   Seizures ([place])    Shortness of breath    There were no vitals taken for this visit.  Opioid Risk Score:   Fall Risk Score:  `1  Depression screen PHQ 2/9  Depression screen [facility] 2/9 06/11/2021 05/14/2021 05/08/2021 04/09/2021 03/12/2021 03/12/2021 01/08/2021  Decreased Interest 0 1 0 0 0 0 3  Down, Depressed,  Hopeless 0 0 0 0 0 0 1  PHQ - 2 Score 0 1 0 0 0 0 4  Altered sleeping - 2 - - - - 3  Tired, decreased energy - 1 - - - - 3  Change in appetite - 2 - - - - 1  Feeling bad or failure about yourself  - 1 - - - - 2  Trouble concentrating - 2 - - - - 3  Moving slowly or  fidgety/restless - 1 - - - - 2  Suicidal thoughts - 0 - - - - 0  PHQ-9 Score - 10 - - - - 18  Difficult doing work/chores - Somewhat difficult - - - - -  Some encounter information is confidential and restricted. Go to Review Flowsheets activity to see all data.  Some recent data might be hidden    Review of Systems  Musculoskeletal:  Positive for back pain.       Pain in both knee & both shoulders  All other systems reviewed and are negative.     Objective:   Physical Exam Gen: no distress, normal appearing HEENT: oral mucosa pink and moist, NCAT Cardio: Reg rate Chest: normal effort, normal rate of breathing Abd: soft, non-distended Ext: no edema Psych: pleasant, normal affect Musculoskeletal:     Cervical back: Normal range of motion and neck supple.     Comments: Normal Muscle Bulk and Muscle Testing Reveals:  Upper Extremities: Full ROM and Muscle Strength 5/5 Lumbar Paraspinal Tenderness: L-4-L-5 Lower Extremities: Full ROM and Muscle Strength 5/5 Arises from chair with ease Narrow Based  Gait     Skin:    General: Skin is warm and dry.  Neurological:     Mental Status: She is alert and oriented to person, place, and time.  Psychiatric:        Mood and Affect: Mood normal.        Behavior: Behavior normal.        Assessment & Plan:  1. Chronic Pain of Bilateral Shoulders L>R: Continue HEP as Tolerated. Continue to Monitor.  2. Failed Back Syndrome of Lumbar Syndrome : Continue HEP as Tolerated. Continue current medication regimen. Continue to Monitor.  -Discussed current symptoms of pain and history of pain.  -Discussed benefits of exercise in reducing pain. -Discussed following foods that may reduce pain: 1) Ginger (especially studied for arthritis)- reduce leukotriene production to decrease inflammation 2) Blueberries- high in phytonutrients that decrease inflammation 3) Salmon- marine omega-3s reduce joint swelling and pain 4) Pumpkin seeds- reduce  inflammation 5) dark chocolate- reduces inflammation 6) turmeric- reduces inflammation 7) tart cherries - reduce pain and stiffness 8) extra virgin olive oil - its compound olecanthal helps to block prostaglandins  9) chili peppers- can be eaten or applied topically via capsaicin 10) mint- helpful for headache, muscle aches, joint pain, and itching 11) garlic- reduces inflammation  Link to further information on diet for chronic pain: [email or web address].[email or web address]/  Provided with a Pain Relief Journal 3. Chronic Lower Back Pain without Sciatica: Continue HEP as Tolerated. Continue to Monitor. 4. Left Knee Pain: No Complaints today. Continue HEP as Tolerated. Continue to Monitor. 5. Muscle Spasm of Shoulders/ Upper Back : Continue Flexeril. Continue to Monitor. 6. Chronic Pain Syndrome: Refilled: Oxycodone '5mg'$ /325 mg two tablets twice a day #120. We will continue the opioid monitoring program, this consists of regular clinic visits, examinations, urine drug screen, pill counts as well as use of [place] Controlled Substance Reporting system.  7.  Constipation:  -Continue Linzess as prescribed by GI -Provided list of following foods that help with constipation and highlighted a few: 1) prunes- contain high amounts of fiber.  2) apples- has a form of dietary fiber called pectin that accelerates stool movement and increases beneficial gut bacteria 3) pears- in addition to fiber, also high in fructose and sorbitol which have laxative effect 4) figs- contain an enzyme ficin which helps to speed colonic transit 5) kiwis- contain an enzyme actinidin that improves gut motility and reduces constipation 6) oranges- rich in pectin (like apples) 7) grapefruits- contain a flavanol naringenin which has a laxative effect 8) vegetables- rich in fiber and also great sources of folate, vitamin C, and K 9) artichoke- high in inulin, prebiotic  great for the microbiome 10) chicory- increases stool frequency and softness (can be added to coffee) 11) rhubarb- laxative effect 12) sweet potato- high fiber 13) beans, peas, and lentils- contain both soluble and insoluble fiber 14) chia seeds- improves intestinal health and gut flora 15) flaxseeds- laxative effect 16) whole grain rye bread- high in fiber 17) oat bran- high in soluble and insoluble fiber 18) kefir- softens stools -recommended to try at least one of these foods every day.  -drink 6-8 glasses of water per day -walk regularly, especially after meals.   8) Anxiety: -Continue alprazolam as prescribed by Dr. Moshe Cipro. Appreciate Eunice's counseling regarding increased risk of side effects with use of Alprazolam and Oxycodone.  -Discussed exercise and meditation as tools to decrease anxiety. -Recommended Down Dog Yoga app -Discussed spending time outdoors. -Discussed positive re-framing of anxiety.  -Discussed the following foods that have been show to reduce anxiety: 1) [place] nuts, mushrooms, soy beans due to their high selenium content. Upper limit of toxicity of selenium is 47mg/day so no more than 3-4 bBolivianuts per day.  2) Fatty fish such as salmon, mackerel, sardines, trout, and herring- high in omega-3 fatty acids 3) Eggs- increases serotonin and dopamine 4) Pumpkin seeds- high in omega-3 fatty acids 5) dark chocolate- high in flavanols that increase blood flow to brain 6) turmeric- take with black pepper to increase absorption 7) chamomile tea- antioxidant and anti-inflammatory properties 8) yogurt without sugar- supports gut-brain axis 9) green tea- contains L- theanine 10) blueberries- high in vitamin C and antioxidants 11) [place] high in tryptophan which gets converted to serotonin 12) bell peppers- rich in vitamin C and antioxidants 13) citrus fruits- rich in vitamin C and antioxidants 14) almonds- high in vitamin E and healthy fats 15) chia seeds- high  in omega-3 fatty acids  9) Prediabetes -HgbA1c reviewed- 6.3 -check CBGs daily, log, and bring log to follow-up appointment -avoid sugar, bread, pasta, rice -avoid snacking -try to incorporate into your diet some of the following foods which are good for diabetes: 1) cinnamon- imitates effects of insulin, increasing glucose transport into cells (C[place] VGuinea-Bissaucinnamon is best, least processed) 2) nuts- can slow down the blood sugar response of carbohydrate rich foods 3) oatmeal- contains and anti-inflammatory compound avenanthramide 4) whole-milk yogurt (best types are no sugar, GMayotteyogurt, or goat/sheep yogurt) 5) beans- high in protein, fiber, and vitamins, low glycemic index 6) broccoli- great source of vitamin A and C 7) quinoa- higher in protein and fiber than other grains 8) spinach- high in vitamin A, fiber, and protein 9) olive oil- reduces glucose levels, LDL, and triglycerides 10) salmon- excellent amount of omega-3-fatty acids 11) walnuts- rich in antioxidants 12) apples- high in fiber and quercetin 13) carrots-  highly nutritious with low impact on blood sugar 14) eggs- improve HDL (good cholesterol), high in protein, keep you satiated 15) turmeric: improves blood sugars, cardiovascular disease, and protects kidney health 16) garlic: improves blood sugar, blood pressure, pain 17) tomatoes: highly nutritious with low impact on blood sugar   10) Obesity: -Educated regarding health benefits of weight loss- for pain, general health, chronic disease prevention, immune health, mental health.  -prescribed magnesium gluconate '250mg'$  HS -Will monitor weight every visit.  -Consider [place] tea daily.  -Discussed the benefits of intermittent fasting. -Discussed foods that can assist in weight loss: 1) leafy greens- high in fiber and nutrients 2) dark chocolate- improves metabolism (if prefer sweetened, best to sweeten with honey instead of sugar).  3) cruciferous  vegetables- high in fiber and protein 4) full fat yogurt: high in healthy fat, protein, calcium, and probiotics 5) apples- high in a variety of phytochemicals 6) nuts- high in fiber and protein that increase feelings of fullness 7) grapefruit: rich in nutrients, antioxidants, and fiber (not to be taken with anticoagulation) 8) beans- high in protein and fiber 9) salmon- has high quality protein and healthy fats 10) green tea- rich in polyphenols 11) eggs- rich in choline and vitamin D 12) tuna- high protein, boosts metabolism 13) avocado- decreases visceral abdominal fat 14) chicken (pasture raised): high in protein and iron 15) blueberries- reduce abdominal fat and cholesterol 16) whole grains- decreases calories retained during digestion, speeds metabolism 17) chia seeds- English appetite 18) chilies- increases fat metabolism  -Discussed supplements that can be used:  1) Metatrim '400mg'$  BID 30 minutes before breakfast and dinner  2) Sphaeranthus indicus and Garcinia mangostana (combinations of these and #1 can be found in capsicum and zychrome  3) green coffee bean extract '400mg'$  twice per day or Irvingia (african mango) 150 to '300mg'$  twice per day.     F/U in 1 month  with [facility] x3, with me in 3 months.   40 minutes spent in review of patient's prior note, PDMP, medications, review of GI note, review of PCP note, review of HgbA1c, review of her weight, discussion of foods that can help with her conditions, provided with pain relief journal, her love for her kids.

## 2021-07-12 ENCOUNTER — Encounter: Payer: Medicare HMO | Attending: Physical Medicine and Rehabilitation | Admitting: Physical Medicine and Rehabilitation

## 2021-07-12 ENCOUNTER — Other Ambulatory Visit: Payer: Self-pay

## 2021-07-12 VITALS — BP 129/81 | HR 89 | Temp 98.4°F | Ht 66.0 in | Wt 190.4 lb

## 2021-07-12 DIAGNOSIS — G8929 Other chronic pain: Secondary | ICD-10-CM | POA: Insufficient documentation

## 2021-07-12 DIAGNOSIS — M94261 Chondromalacia, right knee: Secondary | ICD-10-CM | POA: Insufficient documentation

## 2021-07-12 DIAGNOSIS — M961 Postlaminectomy syndrome, not elsewhere classified: Secondary | ICD-10-CM | POA: Diagnosis not present

## 2021-07-12 DIAGNOSIS — Z79891 Long term (current) use of opiate analgesic: Secondary | ICD-10-CM | POA: Diagnosis not present

## 2021-07-12 DIAGNOSIS — G894 Chronic pain syndrome: Secondary | ICD-10-CM | POA: Insufficient documentation

## 2021-07-12 DIAGNOSIS — M25511 Pain in right shoulder: Secondary | ICD-10-CM | POA: Insufficient documentation

## 2021-07-12 DIAGNOSIS — Z5181 Encounter for therapeutic drug level monitoring: Secondary | ICD-10-CM | POA: Diagnosis not present

## 2021-07-12 DIAGNOSIS — M25512 Pain in left shoulder: Secondary | ICD-10-CM | POA: Insufficient documentation

## 2021-07-12 DIAGNOSIS — M7918 Myalgia, other site: Secondary | ICD-10-CM | POA: Diagnosis not present

## 2021-07-12 DIAGNOSIS — M94262 Chondromalacia, left knee: Secondary | ICD-10-CM | POA: Diagnosis not present

## 2021-07-12 DIAGNOSIS — M545 Low back pain, unspecified: Secondary | ICD-10-CM | POA: Insufficient documentation

## 2021-07-12 MED ORDER — OXYCODONE HCL 5 MG PO TABS: 10.0000 mg | ORAL_TABLET | Freq: Two times a day (BID) | ORAL | 0 refills | Status: DC

## 2021-07-12 MED ORDER — MAGNESIUM GLUCONATE 250 MG PO TABS: 1.0000 | ORAL_TABLET | Freq: Every day | ORAL | 3 refills | Status: DC

## 2021-07-12 MED ORDER — DULOXETINE HCL 20 MG PO CPEP: 20.0000 mg | ORAL_CAPSULE | Freq: Every day | ORAL | 3 refills | Status: DC

## 2021-07-12 MED ORDER — ONDANSETRON HCL 4 MG PO TABS: 4.0000 mg | ORAL_TABLET | Freq: Three times a day (TID) | ORAL | 0 refills | Status: DC | PRN

## 2021-07-12 NOTE — Patient Instructions (Addendum)
[id number]  Foods that may reduce pain: 1) Ginger (especially studied for arthritis)- reduce leukotriene production to decrease inflammation 2) Blueberries- high in phytonutrients that decrease inflammation 3) Salmon- marine omega-3s reduce joint swelling and pain 4) Pumpkin seeds- reduce inflammation 5) dark chocolate- reduces inflammation 6) turmeric- reduces inflammation 7) tart cherries - reduce pain and stiffness 8) extra virgin olive oil - its compound olecanthal helps to block prostaglandins  9) chili peppers- can be eaten or applied topically via capsaicin 10) mint- helpful for headache, muscle aches, joint pain, and itching 11) garlic- reduces inflammation  Constipation:  1) prunes- contain high amounts of fiber.  2) apples- has a form of dietary fiber called pectin that accelerates stool movement and increases beneficial gut bacteria 3) pears- in addition to fiber, also high in fructose and sorbitol which have laxative effect 4) figs- contain an enzyme ficin which helps to speed colonic transit 5) kiwis- contain an enzyme actinidin that improves gut motility and reduces constipation 6) oranges- rich in pectin (like apples) 7) grapefruits- contain a flavanol naringenin which has a laxative effect 8) vegetables- rich in fiber and also great sources of folate, vitamin C, and K 9) artichoke- high in inulin, prebiotic great for the microbiome 10) chicory- increases stool frequency and softness (can be added to coffee) 11) rhubarb- laxative effect 12) sweet potato- high fiber 13) beans, peas, and lentils- contain both soluble and insoluble fiber 14) chia seeds- improves intestinal health and gut flora 15) flaxseeds- laxative effect 16) whole grain rye bread- high in fiber 17) oat bran- high in soluble and insoluble fiber 18) kefir- softens stools -recommended to try at least one of these foods every day.  -drink 6-8 glasses of water per day -walk regularly, especially after meals.    Anxiety: -Discussed exercise and meditation as tools to decrease anxiety. -Recommended Down Dog Yoga app -Discussed spending time outdoors. -Discussed positive re-framing of anxiety.  -Discussed the following foods that have been show to reduce anxiety: 1) [place] nuts, mushrooms, soy beans due to their high selenium content. Upper limit of toxicity of selenium is 43mg/day so no more than 3-4 bBolivianuts per day.  2) Fatty fish such as salmon, mackerel, sardines, trout, and herring- high in omega-3 fatty acids 3) Eggs- increases serotonin and dopamine 4) Pumpkin seeds- high in omega-3 fatty acids 5) dark chocolate- high in flavanols that increase blood flow to brain 6) turmeric- take with black pepper to increase absorption 7) chamomile tea- antioxidant and anti-inflammatory properties 8) yogurt without sugar- supports gut-brain axis 9) green tea- contains L- theanine 10) blueberries- high in vitamin C and antioxidants 11) t[place] high in tryptophan which gets converted to serotonin 12) bell peppers- rich in vitamin C and antioxidants 13) citrus fruits- rich in vitamin C and antioxidants 14) almonds- high in vitamin E and healthy fats 15) chia seeds- high in omega-3 fatty acids   Insomnia: -Try to go outside near sunrise -Get exercise during the day.  -Discussed good sleep hygiene: turning off all devices an hour before bedtime.  -Chamomile tea with dinner.  -Can consider over the counter melatonin  Diabetes: 1) cinnamon- imitates effects of insulin, increasing glucose transport into cells (C[place] VGuinea-Bissaucinnamon is best, least processed) 2) nuts- can slow down the blood sugar response of carbohydrate rich foods 3) oatmeal- contains and anti-inflammatory compound avenanthramide 4) whole-milk yogurt (best types are no sugar, G[place]yogurt, or goat/sheep yogurt) 5) beans- high in protein, fiber, and vitamins, low glycemic index 6)  broccoli- great source of vitamin A and  C 7) quinoa- higher in protein and fiber than other grains 8) spinach- high in vitamin A, fiber, and protein 9) olive oil- reduces glucose levels, LDL, and triglycerides 10) salmon- excellent amount of omega-3-fatty acids 11) walnuts- rich in antioxidants 12) apples- high in fiber and quercetin 13) carrots- highly nutritious with low impact on blood sugar 14) eggs- improve HDL (good cholesterol), high in protein, keep you satiated 15) turmeric: improves blood sugars, cardiovascular disease, and protects kidney health 16) garlic: improves blood sugar, blood pressure, pain 17) tomatoes: highly nutritious with low impact on blood sugar

## 2021-08-03 ENCOUNTER — Other Ambulatory Visit: Payer: Self-pay | Admitting: Physical Medicine and Rehabilitation

## 2021-08-08 ENCOUNTER — Other Ambulatory Visit: Payer: Self-pay | Admitting: Family Medicine

## 2021-08-09 ENCOUNTER — Encounter: Payer: Medicare HMO | Attending: Physical Medicine and Rehabilitation | Admitting: Registered Nurse

## 2021-08-09 ENCOUNTER — Other Ambulatory Visit: Payer: Self-pay

## 2021-08-09 DIAGNOSIS — G894 Chronic pain syndrome: Secondary | ICD-10-CM | POA: Insufficient documentation

## 2021-08-09 DIAGNOSIS — Z5181 Encounter for therapeutic drug level monitoring: Secondary | ICD-10-CM | POA: Insufficient documentation

## 2021-08-09 DIAGNOSIS — G8929 Other chronic pain: Secondary | ICD-10-CM | POA: Diagnosis not present

## 2021-08-09 DIAGNOSIS — M545 Low back pain, unspecified: Secondary | ICD-10-CM | POA: Diagnosis not present

## 2021-08-09 DIAGNOSIS — M5416 Radiculopathy, lumbar region: Secondary | ICD-10-CM | POA: Insufficient documentation

## 2021-08-09 DIAGNOSIS — M961 Postlaminectomy syndrome, not elsewhere classified: Secondary | ICD-10-CM | POA: Diagnosis not present

## 2021-08-09 MED ORDER — OXYCODONE HCL 5 MG PO TABS: 10.0000 mg | ORAL_TABLET | Freq: Two times a day (BID) | ORAL | 0 refills | Status: DC

## 2021-08-09 NOTE — Progress Notes (Addendum)
Subjective:    Patient ID: Jane English, female    DOB: 1971/05/29, 50 y.o.   MRN: [id number]  HPI: Jane English is a 50 y.o. female whose appointment was changed to a My-Chart Video Visit, she brought her grand-daughter to the office, due to the office policy visit was changed to a virtual visit. She verbalizes understanding and agrees with My-Chart Video Visit. She verbalizes understanding and agrees with My-Chart Video Visit. She states her pain is located in her lower back mainly left side radiating into her left buttock and left lower extremity. Also reports increase intensity of lower back pain since a fall a few months ago, we will obtain a X-ray, she verbalizes understanding.  She rates her pain 9. Her current exercise regime is walking and performing stretching   Ms. Rambert Morphine equivalent is 30.00 MME.She  is also prescribed Alprazolam  by Dr. Moshe Cipro .We have discussed the black box warning of using opioids and benzodiazepines. I highlighted the dangers of using these drugs together and discussed the adverse events including respiratory suppression, overdose, cognitive impairment and importance of compliance with current regimen. We will continue to monitor and adjust as indicated.  .      Pain Inventory Average Pain 10 Pain Right Now 9 My pain is sharp and aching  In the last 24 hours, has pain interfered with the following? General activity 8 Relation with others 8 Enjoyment of life 8 What TIME of day is your pain at its worst? daytime and night Sleep (in general) Fair  Pain is worse with: walking and sitting Pain improves with: medication Relief from Meds: 6  Family History  Problem Relation Age of Onset   Lung cancer Father    Stomach cancer Father    Esophageal cancer Father    Alcohol abuse Father    Mental illness Father    Diabetes Sister    Hypertension Sister    Bipolar disorder Sister    Schizophrenia Sister    Diabetes Sister    Alcohol abuse  Brother    Hypertension Brother    Kidney disease Brother    Diabetes Brother    Drug abuse Brother    Mental illness Brother    Alcohol abuse Brother    Alcohol abuse Brother    Hypertension Brother    Diabetes Brother    Alcohol abuse Brother    Physical abuse Mother    Alcohol abuse Mother    Cirrhosis Mother    Mental illness Brother        in [place]   Drug abuse Sister    HIV Sister    Alcohol abuse Brother    Pneumonia Sister        died as a baby   Alcohol abuse Brother    Bipolar disorder Brother    Bipolar disorder Daughter    Bipolar disorder Son    Bipolar disorder Son    Hypertension Brother    Bipolar disorder Brother    Drug abuse Brother    Alcohol abuse Brother    Bipolar disorder Brother    ADD / ADHD Neg Hx    Anxiety disorder Neg Hx    Dementia Neg Hx    Depression Neg Hx    OCD Neg Hx    Seizures Neg Hx    Paranoid behavior Neg Hx    Colon cancer Neg Hx    Social History   Socioeconomic History   Marital status: Married  Spouse name: Not on file   Number of children: 4   Years of education: Not on file   Highest education level: Not on file  Occupational History   Not on file  Tobacco Use   Smoking status: Never   Smokeless tobacco: Never  Vaping Use   Vaping Use: Never used  Substance and Sexual Activity   Alcohol use: No   Drug use: No   Sexual activity: Not Currently    Partners: Male    Birth control/protection: Surgical    Comment: tubal  Other Topics Concern   Not on file  Social History Narrative   Not on file   Social Determinants of Health   Financial Resource Strain: Low Risk    Difficulty of Paying Living Expenses: Not very hard  Food Insecurity: No Food Insecurity   Worried About Running Out of Food in the Last Year: Never true   Ran Out of Food in the Last Year: Never true  Transportation Needs: No Transportation Needs   Lack of Transportation (Medical): No   Lack of Transportation (Non-Medical): No   Physical Activity: Insufficiently Active   Days of Exercise per Week: 4 days   Minutes of Exercise per Session: 30 min  Stress: Stress Concern Present   Feeling of Stress : To some extent  Social Connections: Socially Isolated   Frequency of Communication with Friends and Family: Three times a week   Frequency of Social Gatherings with Friends and Family: Twice a week   Attends Religious Services: Never   Marine scientist or Organizations: No   Attends Music therapist: Never   Marital Status: Separated   Past Surgical History:  Procedure Laterality Date   ANTERIOR CERVICAL DECOMP/DISCECTOMY FUSION  07/07/2012   Procedure: ANTERIOR CERVICAL DECOMPRESSION/DISCECTOMY FUSION 2 LEVELS;  Surgeon: Floyce Stakes, MD;  Location: [facility];  Service: Neurosurgery;  Laterality: N/A;  Cervical four-five, five - six  Anterior cervical decompression/diskectomy/fusion/plate   APPENDECTOMY  1986   BOWEL RESECTION N/A 07/29/2013   Procedure: serosal repair;  Surgeon: Adin Hector, MD;  Location: [facility];  Service: General;  Laterality: N/A;   CARPAL TUNNEL RELEASE Bilateral    COLON SURGERY N/A    Phreesia 07/29/2020   LAPAROSCOPY N/A 07/29/2013   Procedure: diagnostic laporoscopy;  Surgeon: Adin Hector, MD;  Location: [facility];  Service: General;  Laterality: N/A;   LAPAROSCOPY N/A 08/16/2013   Procedure: LAPAROSCOPY DIAGNOSTIC/LYSIS OF ADHESIONS;  Surgeon: Adin Hector, MD;  [facility]: [facility];  Service: General;  Laterality: N/A;   LAPAROTOMY N/A 08/16/2013   Procedure: EXPLORATORY LAPAROTOMY/SMALL BOWEL RESECTION (JEJUNUM);  Surgeon: Adin Hector, MD;  [facility]: [facility];  Service: General;  Laterality: N/A;   LUMBAR SPINE SURGERY  2010   x 3   LYSIS OF ADHESION  2003   Dr. Irving Shows   LYSIS OF ADHESION N/A 07/29/2013   Procedure: LYSIS OF ADHESION;  Surgeon: Adin Hector, MD;  [facility]: [facility];  Service: General;  Laterality: N/A;   [place]?   Linna Hoff, [place]   SPINAL CORD STIMULATOR IMPLANT     SPINE SURGERY N/A    Phreesia 07/29/2020   TRIGGER FINGER RELEASE  2009   right pinkie finger   TUBAL LIGATION  1994   Past Surgical History:  Procedure Laterality Date   ANTERIOR CERVICAL DECOMP/DISCECTOMY FUSION  07/07/2012   Procedure: ANTERIOR CERVICAL DECOMPRESSION/DISCECTOMY FUSION 2 LEVELS;  Surgeon: Floyce Stakes, MD;  Location: [facility];  Service: Neurosurgery;  Laterality: N/A;  Cervical four-five, five - six  Anterior cervical decompression/diskectomy/fusion/plate   APPENDECTOMY  1986   BOWEL RESECTION N/A 07/29/2013   Procedure: serosal repair;  Surgeon: Adin Hector, MD;  [facility];  Service: General;  Laterality: N/A;   CARPAL TUNNEL RELEASE Bilateral    COLON SURGERY N/A    Phreesia 07/29/2020   LAPAROSCOPY N/A 07/29/2013   Procedure: diagnostic laporoscopy;  Surgeon: Adin Hector, MD;  [facility];  Service: General;  Laterality: N/A;   LAPAROSCOPY N/A 08/16/2013   Procedure: LAPAROSCOPY DIAGNOSTIC/LYSIS OF ADHESIONS;  Surgeon: Adin Hector, MD;  [facility];  Service: General;  Laterality: N/A;   LAPAROTOMY N/A 08/16/2013   Procedure: EXPLORATORY LAPAROTOMY/SMALL BOWEL RESECTION (JEJUNUM);  Surgeon: Adin Hector, MD;  [facility];  Service: General;  Laterality: N/A;   LUMBAR SPINE SURGERY  2010   x 3   LYSIS OF ADHESION  2003   Dr. Irving Shows   LYSIS OF ADHESION N/A 07/29/2013   Procedure: LYSIS OF ADHESION;  Surgeon: Adin Hector, MD;  [facility];  Service: General;  Laterality: N/A;   [place]?   Lin[place], [place]   SPINAL CORD STIMULATOR IMPLANT     SPINE SURGERY N/A    Phreesia 07/29/2020   TRIGGER FINGER RELEASE  2009   right pinkie finger   TUBAL LIGATION  1994   Past Medical History:  Diagnosis Date   Anemia    Asthma    Asthma flare 04/09/2013   Back pain    Bronchitis    Chronic abdominal pain     Chronic constipation    Constipation due to opioid therapy    Depression    Depression, major, single episode, severe ([place]) 10/03/2018   PHQ 9 score of 15   Diabetes mellitus without complication ([place])    Diabetes mellitus, type II ([place])    DVT (deep venous thrombosis) ([place]) 2010   GERD (gastroesophageal reflux disease)    Heart murmur    no cardiologist   Helicobacter pylori gastritis 06/11/2013   Colonoscopy Dr. Hilarie Fredrickson   Hypertension    IBS (irritable bowel syndrome)    Migraine headache    Neuropathy    Obesity    Obsessive-compulsive disorder    PSYCHOTIC D/O W/HALLUCINATIONS CONDS CLASS ELSW 03/04/2010   Qualifier: Diagnosis of  By: Moshe Cipro MD, Margaret     PTSD (post-traumatic stress disorder)    SBO (small bowel obstruction) ([place]) 08/09/2013   Seasonal allergies 12/10/2012   Seizures ([place])    Shortness of breath    There were no vitals taken for this visit.  Opioid Risk Score:   Fall Risk Score:  `1  Depression screen PHQ 2/9  Depression screen [facility] 2/9 07/12/2021 06/11/2021 05/14/2021 05/08/2021 04/09/2021 03/12/2021 03/12/2021  Decreased Interest 0 0 1 0 0 0 0  Down, Depressed, Hopeless 0 0 0 0 0 0 0  PHQ - 2 Score 0 0 1 0 0 0 0  Altered sleeping - - 2 - - - -  Tired, decreased energy - - 1 - - - -  Change in appetite - - 2 - - - -  Feeling bad or failure about yourself  - - 1 - - - -  Trouble concentrating - - 2 - - - -  Moving slowly or fidgety/restless - - 1 - - - -  Suicidal thoughts - - 0 - - - -  PHQ-9 Score - - 10 - - - -  Difficult doing work/chores - - Somewhat difficult - - - -  Some encounter information is confidential and restricted. Go to Review Flowsheets activity to see all data.  Some recent data might be hidden     Review of Systems  Musculoskeletal:  Positive for back pain.       Left leg pain  Neurological:  Positive for numbness.  All other systems reviewed and are negative.     Objective:   Physical Exam Vitals and nursing note reviewed.   Musculoskeletal:     Comments: No Physical Exam Performed: My-Chart Video Visit         Assessment & Plan:  Acute Exacerbation of Chronic Low Back Pain: RX: Lumbar X-ray. Continue to Monitor 2. Chronic Pain of Bilateral Shoulders L>R: No complaints today. Continue HEP as Tolerated. Continue to Monitor. 08/09/2021 3. Failed Back Syndrome of Lumbar Syndrome : Continue HEP as Tolerated. Continue current medication regimen. Continue to Monitor. 08/09/2021 4. Left Lumbar Radiculitis: Continue HEP as Tolerated. Continue Amitriptyline: Continue to Monitor.08/09/2021 5. Left Knee Pain: No Complaints today. Continue HEP as Tolerated. Continue to Monitor.08/09/2021 6. Muscle Spasm of Shoulders/ Upper Back : Continue Flexeril. Continue to Monitor.08/09/2021 7. Chronic Pain Syndrome: Refilled: Oxycodone 5mg /325 mg two tablets twice a day #120. We will continue the opioid monitoring program, this consists of regular clinic visits, examinations, urine drug screen, pill counts as well as use of [place] Controlled Substance Reporting system. A 12 month History has been reviewed on the [place] Controlled Substance Reporting System on 08/09/2021.     F/U in 1 month   Established Patient My-Chart Visit Location of Patient in her Home Location of Provider: In Office

## 2021-08-13 ENCOUNTER — Ambulatory Visit
Admission: RE | Admit: 2021-08-13 | Discharge: 2021-08-13 | Disposition: A | Discharge status: Home / Self Care | Payer: Medicare HMO | Source: Ambulatory Visit | Attending: Registered Nurse | Admitting: Registered Nurse

## 2021-08-13 DIAGNOSIS — M545 Low back pain, unspecified: Secondary | ICD-10-CM | POA: Diagnosis not present

## 2021-08-14 ENCOUNTER — Encounter: Payer: Medicare HMO | Admitting: Family Medicine

## 2021-08-15 ENCOUNTER — Encounter: Payer: Self-pay | Admitting: Registered Nurse

## 2021-09-11 ENCOUNTER — Encounter: Payer: Self-pay | Admitting: Registered Nurse

## 2021-09-11 ENCOUNTER — Encounter: Payer: Medicare HMO | Attending: Physical Medicine and Rehabilitation | Admitting: Registered Nurse

## 2021-09-11 ENCOUNTER — Other Ambulatory Visit: Payer: Self-pay

## 2021-09-11 VITALS — BP 109/72 | HR 79 | Temp 99.1°F | Ht 66.0 in | Wt 196.0 lb

## 2021-09-11 DIAGNOSIS — Z5181 Encounter for therapeutic drug level monitoring: Secondary | ICD-10-CM

## 2021-09-11 DIAGNOSIS — M5416 Radiculopathy, lumbar region: Secondary | ICD-10-CM | POA: Diagnosis not present

## 2021-09-11 DIAGNOSIS — Z79891 Long term (current) use of opiate analgesic: Secondary | ICD-10-CM | POA: Diagnosis not present

## 2021-09-11 DIAGNOSIS — M961 Postlaminectomy syndrome, not elsewhere classified: Secondary | ICD-10-CM

## 2021-09-11 DIAGNOSIS — G894 Chronic pain syndrome: Secondary | ICD-10-CM | POA: Diagnosis not present

## 2021-09-11 MED ORDER — OXYCODONE HCL 5 MG PO TABS: 10.0000 mg | ORAL_TABLET | Freq: Two times a day (BID) | ORAL | 0 refills | Status: DC

## 2021-09-11 NOTE — Progress Notes (Signed)
Subjective:    Patient ID: Jane English, female    DOB: [birth year], 50 y.o.   MRN: [id number]  HPI: Jane English is a 50 y.o. female who returns for follow up appointment for chronic pain and medication refill. She states her pain is located in her lower back mainly left side radiating into her left hip and left lower extremity. Jane English reports increase intensity of pain with working, she is working at [employer]. We discussed working in moderation a letter was written to her employer, she verbalizes understanding. She rates her pain 9. Her current exercise regime is walking and performing stretching exercises.  Jane English Morphine equivalent is 27.00 MME. She is also prescribed alprazolam  by Dr. Moshe Cipro .We have discussed the black box warning of using opioids and benzodiazepines. I highlighted the dangers of using these drugs together and discussed the adverse events including respiratory suppression, overdose, cognitive impairment and importance of compliance with current regimen. We will continue to monitor and adjust as indicated.     Last UDS was Performed on 06/11/2021, it was consistent.      Pain Inventory Average Pain 10 Pain Right Now 9 My pain is constant, sharp, burning, dull, stabbing, tingling, and aching  In the last 24 hours, has pain interfered with the following? General activity 5 Relation with others 5 Enjoyment of life 8 What TIME of day is your pain at its worst? morning , evening, and night Sleep (in general) Fair  Pain is worse with: walking, bending, sitting, and standing Pain improves with: rest, heat/ice, and medication Relief from Meds:  good  Family History  Problem Relation Age of Onset   Lung cancer Father    Stomach cancer Father    Esophageal cancer Father    Alcohol abuse Father    Mental illness Father    Diabetes Sister    Hypertension Sister    Bipolar disorder Sister    Schizophrenia Sister    Diabetes Sister    Alcohol abuse Brother     Hypertension Brother    Kidney disease Brother    Diabetes Brother    Drug abuse Brother    Mental illness Brother    Alcohol abuse Brother    Alcohol abuse Brother    Hypertension Brother    Diabetes Brother    Alcohol abuse Brother    Physical abuse Mother    Alcohol abuse Mother    Cirrhosis Mother    Mental illness Brother        in [place]   Drug abuse Sister    HIV Sister    Alcohol abuse Brother    Pneumonia Sister        died as a baby   Alcohol abuse Brother    Bipolar disorder Brother    Bipolar disorder Daughter    Bipolar disorder Son    Bipolar disorder Son    Hypertension Brother    Bipolar disorder Brother    Drug abuse Brother    Alcohol abuse Brother    Bipolar disorder Brother    ADD / ADHD Neg Hx    Anxiety disorder Neg Hx    Dementia Neg Hx    Depression Neg Hx    OCD Neg Hx    Seizures Neg Hx    Paranoid behavior Neg Hx    Colon cancer Neg Hx    Social History   Socioeconomic History   Marital status: Married    Spouse name: Not on  file   Number of children: 4   Years of education: Not on file   Highest education level: Not on file  Occupational History   Not on file  Tobacco Use   Smoking status: Never   Smokeless tobacco: Never  Vaping Use   Vaping Use: Never used  Substance and Sexual Activity   Alcohol use: No   Drug use: No   Sexual activity: Not Currently    Partners: Male    Birth control/protection: Surgical    Comment: tubal  Other Topics Concern   Not on file  Social History Narrative   Not on file   Social Determinants of Health   Financial Resource Strain: Low Risk    Difficulty of Paying Living Expenses: Not very hard  Food Insecurity: No Food Insecurity   Worried About Running Out of Food in the Last Year: Never true   Ran Out of Food in the Last Year: Never true  Transportation Needs: No Transportation Needs   Lack of Transportation (Medical): No   Lack of Transportation (Non-Medical): No  Physical  Activity: Insufficiently Active   Days of Exercise per Week: 4 days   Minutes of Exercise per Session: 30 min  Stress: Stress Concern Present   Feeling of Stress : To some extent  Social Connections: Socially Isolated   Frequency of Communication with Friends and Family: Three times a week   Frequency of Social Gatherings with Friends and Family: Twice a week   Attends Religious Services: Never   Marine scientist or Organizations: No   Attends Music therapist: Never   Marital Status: Separated   Past Surgical History:  Procedure Laterality Date   ANTERIOR CERVICAL DECOMP/DISCECTOMY FUSION  07/07/2012   Procedure: ANTERIOR CERVICAL DECOMPRESSION/DISCECTOMY FUSION 2 LEVELS;  Surgeon: Floyce Stakes, MD;  Location: [facility];  Service: Neurosurgery;  Laterality: N/A;  Cervical four-five, five - six  Anterior cervical decompression/diskectomy/fusion/plate   APPENDECTOMY  1986   BOWEL RESECTION N/A 07/29/2013   Procedure: serosal repair;  Surgeon: Adin Hector, MD;  Location: [facility];  Service: General;  Laterality: N/A;   CARPAL TUNNEL RELEASE Bilateral    COLON SURGERY N/A    Phreesia 07/29/2020   LAPAROSCOPY N/A 07/29/2013   Procedure: diagnostic laporoscopy;  Surgeon: Adin Hector, MD;  [facility]: [facility];  Service: General;  Laterality: N/A;   LAPAROSCOPY N/A 08/16/2013   Procedure: LAPAROSCOPY DIAGNOSTIC/LYSIS OF ADHESIONS;  Surgeon: Adin Hector, MD;  [facility];  Service: General;  Laterality: N/A;   LAPAROTOMY N/A 08/16/2013   Procedure: EXPLORATORY LAPAROTOMY/SMALL BOWEL RESECTION (JEJUNUM);  Surgeon: Adin Hector, MD;  [facility];  Service: General;  Laterality: N/A;   LUMBAR SPINE SURGERY  2010   x 3   LYSIS OF ADHESION  2003   Dr. Irving Shows   LYSIS OF ADHESION N/A 07/29/2013   Procedure: LYSIS OF ADHESION;  Surgeon: Adin Hector, MD;  [facility];  Service: General;  Laterality: N/A;   [place]?   [place], [place]   SPINAL CORD STIMULATOR IMPLANT     SPINE SURGERY N/A    Phreesia 07/29/2020   TRIGGER FINGER RELEASE  2009   right pinkie finger   TUBAL LIGATION  1994   Past Surgical History:  Procedure Laterality Date   ANTERIOR CERVICAL DECOMP/DISCECTOMY FUSION  07/07/2012   Procedure: ANTERIOR CERVICAL DECOMPRESSION/DISCECTOMY FUSION 2 LEVELS;  Surgeon: Zigmund Daniel  Joya Salm, MD;  Location: [place] [facility];  Service: Neurosurgery;  Laterality: N/A;  Cervical four-five, five - six  Anterior cervical decompression/diskectomy/fusion/plate   APPENDECTOMY  1986   BOWEL RESECTION N/A 07/29/2013   Procedure: serosal repair;  Surgeon: Adin Hector, MD;  [facility];  Service: General;  Laterality: N/A;   CARPAL TUNNEL RELEASE Bilateral    COLON SURGERY N/A    Phreesia 07/29/2020   LAPAROSCOPY N/A 07/29/2013   Procedure: diagnostic laporoscopy;  Surgeon: Adin Hector, MD;  [facility];  Service: General;  Laterality: N/A;   LAPAROSCOPY N/A 08/16/2013   Procedure: LAPAROSCOPY DIAGNOSTIC/LYSIS OF ADHESIONS;  Surgeon: Adin Hector, MD;  [facility];  Service: General;  Laterality: N/A;   LAPAROTOMY N/A 08/16/2013   Procedure: EXPLORATORY LAPAROTOMY/SMALL BOWEL RESECTION (JEJUNUM);  Surgeon: Adin Hector, MD;  [facility];  Service: General;  Laterality: N/A;   LUMBAR SPINE SURGERY  2010   x 3   LYSIS OF ADHESION  2003   Dr. Irving Shows   LYSIS OF ADHESION N/A 07/29/2013   Procedure: LYSIS OF ADHESION;  Surgeon: Adin Hector, MD;  [facility];  Service: General;  Laterality: N/A;   [place]?   [place], [place]   SPINAL CORD STIMULATOR IMPLANT     SPINE SURGERY N/A    Phreesia 07/29/2020   TRIGGER FINGER RELEASE  2009   right pinkie finger   TUBAL LIGATION  1994   Past Medical History:  Diagnosis Date   Anemia    Asthma    Asthma flare 04/09/2013   Back pain    Bronchitis    Chronic abdominal pain    Chronic  constipation    Constipation due to opioid therapy    Depression    Depression, major, single episode, severe ([place]) 10/03/2018   PHQ 9 score of 15   Diabetes mellitus without complication ([place])    Diabetes mellitus, type II ([place])    DVT (deep venous thrombosis) ([place]) 2010   GERD (gastroesophageal reflux disease)    Heart murmur    no cardiologist   Helicobacter pylori gastritis 06/11/2013   Colonoscopy Dr. Hilarie Fredrickson   Hypertension    IBS (irritable bowel syndrome)    Migraine headache    Neuropathy    Obesity    Obsessive-compulsive disorder    PSYCHOTIC D/O W/HALLUCINATIONS CONDS CLASS ELSW 03/04/2010   Qualifier: Diagnosis of  By: Moshe Cipro MD, Margaret     PTSD (post-traumatic stress disorder)    SBO (small bowel obstruction) ([place]) 08/09/2013   Seasonal allergies 12/10/2012   Seizures ([place])    Shortness of breath    There were no vitals taken for this visit.  Opioid Risk Score:   Fall Risk Score:  `1  Depression screen PHQ 2/9  Depression screen [facility] 2/9 07/12/2021 06/11/2021 05/14/2021 05/08/2021 04/09/2021 03/12/2021 03/12/2021  Decreased Interest 0 0 1 0 0 0 0  Down, Depressed, Hopeless 0 0 0 0 0 0 0  PHQ - 2 Score 0 0 1 0 0 0 0  Altered sleeping - - 2 - - - -  Tired, decreased energy - - 1 - - - -  Change in appetite - - 2 - - - -  Feeling bad or failure about yourself  - - 1 - - - -  Trouble concentrating - - 2 - - - -  Moving slowly or fidgety/restless - - 1 - - - -  Suicidal thoughts - - 0 - - - -  PHQ-9 Score - - 10 - - - -  Difficult doing work/chores - - Somewhat difficult - - - -  Some encounter information is confidential and restricted. Go to Review Flowsheets activity to see all data.  Some recent data might be hidden    Review of Systems  Musculoskeletal:  Positive for back pain and gait problem.       Left knees pain, pain in left hip  All other systems reviewed and are negative.     Objective:   Physical Exam Vitals and nursing note reviewed.  Constitutional:       Appearance: Normal appearance.  Cardiovascular:     Rate and Rhythm: Normal rate and regular rhythm.     Pulses: Normal pulses.     Heart sounds: Normal heart sounds.  Pulmonary:     Effort: Pulmonary effort is normal.     Breath sounds: Normal breath sounds.  Musculoskeletal:     Cervical back: Normal range of motion and neck supple.     Comments: Normal Muscle Bulk and Muscle Testing Reveals:  Upper Extremities: Full ROM and Muscle Strength 5/5 Lumbar Paraspinal Tenderness: L-4-L-5 Mainly Left Side  Left Greater Trochanter Tenderness Lower Extremities: Full ROM and Muscle Strength 5/5 Arises from Table with ease Narrow Based  Gait     Skin:    General: Skin is warm and dry.  Neurological:     Mental Status: She is alert and oriented to person, place, and time.  Psychiatric:        Mood and Affect: Mood normal.        Behavior: Behavior normal.         Assessment & Plan:  1. Chronic Pain of Bilateral Shoulders L>R: Continue HEP as Tolerated. Continue to Monitor. 09/11/2021 2. Failed Back Syndrome of Lumbar Syndrome : Continue HEP as Tolerated. Continue current medication regimen. Continue to Monitor. 09/11/2021 3. Chronic Lower Back Pain without Sciatica: Continue HEP as Tolerated. Continue to Monitor.09/11/2021 4. Left Knee Pain: No Complaints today. Continue HEP as Tolerated. Continue to Monitor.09/11/2021 5. Muscle Spasm of Shoulders/ Upper Back : Continue Flexeril. Continue to Monitor.09/11/2021 6. Chronic Pain Syndrome: Refilled: Oxycodone 5mg /325 mg two tablets twice a day #120. We will continue the opioid monitoring program, this consists of regular clinic visits, examinations, urine drug screen, pill counts as well as use of [place] Controlled Substance Reporting system. A 12 month History has been reviewed on the [place] Controlled Substance Reporting System on 09/11/2021.   F/U in 1 month

## 2021-09-25 ENCOUNTER — Other Ambulatory Visit: Payer: Self-pay | Admitting: Internal Medicine

## 2021-09-25 ENCOUNTER — Encounter: Payer: Self-pay | Admitting: Internal Medicine

## 2021-09-25 ENCOUNTER — Ambulatory Visit (INDEPENDENT_AMBULATORY_CARE_PROVIDER_SITE_OTHER): Payer: Medicare HMO | Admitting: Internal Medicine

## 2021-09-25 ENCOUNTER — Telehealth: Payer: Self-pay | Admitting: Internal Medicine

## 2021-09-25 VITALS — BP 124/80 | HR 90 | Ht 66.0 in | Wt 195.0 lb

## 2021-09-25 DIAGNOSIS — K581 Irritable bowel syndrome with constipation: Secondary | ICD-10-CM | POA: Diagnosis not present

## 2021-09-25 DIAGNOSIS — K219 Gastro-esophageal reflux disease without esophagitis: Secondary | ICD-10-CM | POA: Diagnosis not present

## 2021-09-25 DIAGNOSIS — J449 Chronic obstructive pulmonary disease, unspecified: Secondary | ICD-10-CM | POA: Diagnosis not present

## 2021-09-25 DIAGNOSIS — R1084 Generalized abdominal pain: Secondary | ICD-10-CM | POA: Diagnosis not present

## 2021-09-25 MED ORDER — PLENVU 140 G PO SOLR: 1.0000 | ORAL | 0 refills | Status: DC

## 2021-09-25 NOTE — Telephone Encounter (Signed)
Pt stopped by at the front desk after ov with Dr. Hilarie Fredrickson requesting an out of work letter for 10/12/21 and 10/13/21 so she can have her procedure. Her procedure is scheduled for 10/22/21 but pt stated that she needs both days off. Pls send letter via [place].

## 2021-09-25 NOTE — Patient Instructions (Addendum)
You have been given a sample prep kit Plenvu for a bowel purge. Take Plenvu as instructed by Dr.Pyrtle.   Continue taking Linzess , Amitiza and Miralax as directed.   You have been scheduled for a colonoscopy. Please follow written instructions given to you at your visit today.  Please pick up your prep supplies at the pharmacy within the next 1-3 days. If you use inhalers (even only as needed), please bring them with you on the day of your procedure.  We have sent the following medications to your pharmacy for you to pick up at your convenience: Plenvu  If you are age 26 or younger, your body mass index should be between 19-25. Your Body mass index is 31.47 kg/m. If this is out of the aformentioned range listed, please consider follow up with your Primary Care Provider.   ________________________________________________________  The [place] [place] [facility] providers would like to encourage you to use [facility] to communicate with providers for non-urgent requests or questions.  Due to long hold times on the telephone, sending your provider a message by [facility] may be a faster and more efficient way to get a response.  Please allow 48 business hours for a response.  Please remember that this is for non-urgent requests.  _______________________________________________________  Thank you for choosing me and [facility].  Dr.Jay Pyrtle

## 2021-09-25 NOTE — Progress Notes (Signed)
   Subjective:    Patient ID: Jane English, female    DOB: 1971-01-06, 50 y.o.   MRN: [id number]  HPI Jane English is a 50 year old female with chronic constipation with irritable bowel syndrome, history of abdominal adhesive disease, GERD, previously treated H. pylori, chronic pain, history of bipolar depression and hypertension who is here for follow-up.  I last saw her in August 2022.  She is here alone today.  She reports she continues to struggle with constipation despite Linzess and Amitiza plus MiraLAX daily.  She is having episodes of intense abdominal cramping pain which triggers a diaphoretic type response with nausea.  At times she has to lie down for this to get better.  Bowel movements are still inconsistent despite adherence to Linzess 290 mcg daily, Amitiza 24 mcg twice daily and MiraLAX 17 g once daily.  She is using some MiraLAX typically daily at 17 g  She is now working at [employer] As per HPI, otherwise negative  Current Medications, Allergies, Past Medical History, Past Surgical History, Family History and Social History were reviewed in [employer] record.    Objective:   Physical Exam BP 124/80   Pulse 90   Ht 5\' 6"  (1.676 m)   Wt 195 lb (88.5 kg)   SpO2 98%   BMI 31.47 kg/m  Gen: awake, alert, NAD HEENT: anicteric  CV: RRR, no mrg Pulm: CTA b/l Abd: soft, mild diffuse tenderness without rebound or guarding and slight distention,  +BS throughout Ext: no c/c/e Neuro: nonfocal      Assessment & Plan:  50 year old female with chronic constipation with irritable bowel syndrome, history of abdominal adhesive disease, GERD, previously treated H. pylori, chronic pain, history of bipolar depression and hypertension who is here for follow-up.   Chronic constipation/IBS --she is having abdominal cramping which sounds spastic in nature leading to a vasovagal type response.  We discussed this today.  Given her history of prior  surgery, we will exclude luminal narrowing/stricturing with colonoscopy. --Bowel purge today --Continue Linzess 290 mcg and Amitiza 24 mcg twice daily after bowel purge --Can continue MiraLAX 17 g once to twice daily --We discussed the risk, benefits and alternatives of colonoscopy in the [place] and she is agreeable and wishes to proceed  2.  GERD/dyspepsia --continue pantoprazole 40 mg daily

## 2021-09-26 NOTE — Telephone Encounter (Signed)
Patient can be given a note the day of procedure. Patient will need to discuss with Dr Hilarie Fredrickson if note is needed for the day after  the procedure.

## 2021-10-03 ENCOUNTER — Other Ambulatory Visit: Payer: Self-pay | Admitting: Family Medicine

## 2021-10-04 ENCOUNTER — Telehealth: Payer: Medicare HMO | Admitting: Family Medicine

## 2021-10-04 ENCOUNTER — Telehealth: Payer: Self-pay | Admitting: Family Medicine

## 2021-10-04 DIAGNOSIS — J069 Acute upper respiratory infection, unspecified: Secondary | ICD-10-CM | POA: Diagnosis not present

## 2021-10-04 MED ORDER — BENZONATATE 100 MG PO CAPS: 100.0000 mg | ORAL_CAPSULE | Freq: Two times a day (BID) | ORAL | 0 refills | Status: DC | PRN

## 2021-10-04 MED ORDER — IPRATROPIUM BROMIDE 0.03 % NA SOLN: 2.0000 | Freq: Two times a day (BID) | NASAL | 0 refills | Status: AC

## 2021-10-04 NOTE — Telephone Encounter (Signed)
Was coughing and not feeling good and requesting antibiotic, not provider appt available today so I walked her through the steps of submitting an evisit

## 2021-10-04 NOTE — Progress Notes (Signed)
° °E-Visit for Upper Respiratory Infection  ° °We are sorry you are not feeling well.  Here is how we plan to help! ° °Based on what you have shared with me, it looks like you may have a viral upper respiratory infection.  Upper respiratory infections are caused by a large number of viruses; however, rhinovirus is the most common cause.  ° °Symptoms vary from person to person, with common symptoms including sore throat, cough, fatigue or lack of energy and feeling of general discomfort.  A low-grade fever of up to 100.4 may present, but is often uncommon.  Symptoms vary however, and are closely related to a person's age or underlying illnesses.  The most common symptoms associated with an upper respiratory infection are nasal discharge or congestion, cough, sneezing, headache and pressure in the ears and face.  These symptoms usually persist for about 3 to 10 days, but can last up to 2 weeks.  It is important to know that upper respiratory infections do not cause serious illness or complications in most cases.   ° °Upper respiratory infections can be transmitted from person to person, with the most common method of transmission being a person's hands.  The virus is able to live on the skin and can infect other persons for up to 2 hours after direct contact.  Also, these can be transmitted when someone coughs or sneezes; thus, it is important to cover the mouth to reduce this risk.  To keep the spread of the illness at bay, good hand hygiene is very important. ° °This is an infection that is most likely caused by a virus. There are no specific treatments other than to help you with the symptoms until the infection runs its course.  We are sorry you are not feeling well.  Here is how we plan to help! ° ° °For nasal congestion, you may use an oral decongestants such as Mucinex D or if you have glaucoma or high blood pressure use plain Mucinex.  Saline nasal spray or nasal drops can help and can safely be used as often  as needed for congestion.  For your congestion, I have prescribed Ipratropium Bromide nasal spray 0.03% two sprays in each nostril 2-3 times a day ° °If you do not have a history of heart disease, hypertension, diabetes or thyroid disease, prostate/bladder issues or glaucoma, you may also use Sudafed to treat nasal congestion.  It is highly recommended that you consult with a pharmacist or your primary care physician to ensure this medication is safe for you to take.    ° °If you have a cough, you may use cough suppressants such as Delsym and Robitussin.  If you have glaucoma or high blood pressure, you can also use Coricidin HBP.   °For cough I have prescribed for you A prescription cough medication called Tessalon Perles 100 mg. You may take 1-2 capsules every 8 hours as needed for cough ° °If you have a sore or scratchy throat, use a saltwater gargle- ¼ to ½ teaspoon of salt dissolved in a 4-ounce to 8-ounce glass of warm water.  Gargle the solution for approximately 15-30 seconds and then spit.  It is important not to swallow the solution.  You can also use throat lozenges/cough drops and Chloraseptic spray to help with throat pain or discomfort.  Warm or cold liquids can also be helpful in relieving throat pain. ° °For headache, pain or general discomfort, you can use Ibuprofen or Tylenol as directed.   °  Some authorities believe that zinc sprays or the use of Echinacea may shorten the course of your symptoms. ° ° °HOME CARE °Only take medications as instructed by your medical team. °Be sure to drink plenty of fluids. Water is fine as well as fruit juices, sodas and electrolyte beverages. You may want to stay away from caffeine or alcohol. If you are nauseated, try taking small sips of liquids. How do you know if you are getting enough fluid? Your urine should be a pale yellow or almost colorless. °Get rest. °Taking a steamy shower or using a humidifier may help nasal congestion and ease sore throat pain. You  can place a towel over your head and breathe in the steam from hot water coming from a faucet. °Using a saline nasal spray works much the same way. °Cough drops, hard candies and sore throat lozenges may ease your cough. °Avoid close contacts especially the very young and the elderly °Cover your mouth if you cough or sneeze °Always remember to wash your hands.  ° °GET HELP RIGHT AWAY IF: °You develop worsening fever. °If your symptoms do not improve within 10 days °You develop yellow or green discharge from your nose over 3 days. °You have coughing fits °You develop a severe head ache or visual changes. °You develop shortness of breath, difficulty breathing or start having chest pain °Your symptoms persist after you have completed your treatment plan ° °MAKE SURE YOU  °Understand these instructions. °Will watch your condition. °Will get help right away if you are not doing well or get worse. ° °Thank you for choosing an e-visit. ° °Your e-visit answers were reviewed by a board certified advanced clinical practitioner to complete your personal care plan. Depending upon the condition, your plan could have included both over the counter or prescription medications. ° °Please review your pharmacy choice. Make sure the pharmacy is open so you can pick up prescription now. If there is a problem, you may contact your provider through MyChart messaging and have the prescription routed to another pharmacy.  Your safety is important to us. If you have drug allergies check your prescription carefully.  ° °For the next 24 hours you can use MyChart to ask questions about today's visit, request a non-urgent call back, or ask for a work or school excuse. °You will get an email in the next two days asking about your experience. I hope that your e-visit has been valuable and will speed your recovery. ° ° ° °I provided 5 minutes of non face-to-face time during this encounter for chart review, medication and order placement, as well  as and documentation.  ° °

## 2021-10-04 NOTE — Telephone Encounter (Signed)
Pt called in to speak with nurse

## 2021-10-09 ENCOUNTER — Other Ambulatory Visit: Payer: Self-pay

## 2021-10-09 ENCOUNTER — Encounter: Payer: Self-pay | Admitting: Physical Medicine and Rehabilitation

## 2021-10-09 ENCOUNTER — Encounter: Payer: Medicare HMO | Attending: Physical Medicine and Rehabilitation | Admitting: Physical Medicine and Rehabilitation

## 2021-10-09 VITALS — BP 128/79 | HR 89 | Temp 99.0°F | Resp 96 | Ht 66.0 in | Wt 195.8 lb

## 2021-10-09 DIAGNOSIS — M545 Low back pain, unspecified: Secondary | ICD-10-CM | POA: Diagnosis not present

## 2021-10-09 DIAGNOSIS — G8929 Other chronic pain: Secondary | ICD-10-CM | POA: Diagnosis not present

## 2021-10-09 DIAGNOSIS — M5416 Radiculopathy, lumbar region: Secondary | ICD-10-CM | POA: Insufficient documentation

## 2021-10-09 DIAGNOSIS — M94262 Chondromalacia, left knee: Secondary | ICD-10-CM | POA: Insufficient documentation

## 2021-10-09 DIAGNOSIS — M94261 Chondromalacia, right knee: Secondary | ICD-10-CM | POA: Diagnosis not present

## 2021-10-09 MED ORDER — OXYCODONE HCL 5 MG PO TABS: 10.0000 mg | ORAL_TABLET | Freq: Two times a day (BID) | ORAL | 0 refills | Status: DC

## 2021-10-09 NOTE — Progress Notes (Signed)
Subjective:    Patient ID: Jane English, female    DOB: 02-20-1971, 50 y.o.   MRN: [id number]  HPI: Jane English is a 50 y.o. female who returns for follow-up appointment for chronic pain in her back secondary to failed back syndrome, bilateral knees secondary to chonrdomalacia of bilateral knees, pain in bilateral hands secondary to osteoarthritis, oxycodone refill.   1) Failed back syndrome -Her current exercise regime is walking and performing stretching exercises. -Last UDS was Performed on 01/26/2021, it was consistent.  -She has been taking oxycodone PRN (30MME) for years- it is helpful for her pain -she has had negative responses with Gabapentin and Lyrica.  -pain has been really severe recently.  -she has never tried Cymbalta.  -she has been experiencing more left sided low back pain since she has started working at [employer] -no more falls.  -[employer] has been respecting the note.  -she hopes to see her son and grandchildren soon.  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   2) Chondromalacia of bilateral knees  3) Anxiety -she receives Alprazolam from Dr. Moshe Cipro, PCP- dose was recently reduced in half.  [name] has discussed the black box warning of using opioids and benzodiazepines and highlighted the dangers of using these drugs together and discussed the adverse events including respiratory suppression, overdose, cognitive impairment and importance of compliance with current regimen.  -she has been referred to therapy by her PCP.   4) Osteoarthritis of bilateral hands  5) Right buttock pain  6) Myofascial pain  7) Chronic constipation: -has followed with GI since last visit  8) Prediabetes -she was advised by GI to discuss Wegovy vs. Ozempic with her PCP for weight loss- will also help with diabetes.  -there is plan for repeat colostomy- she had a bad experience with this last time.   9) Nausea: she does feel some some nausea with her pain medications.   10)  Low mood -at times -she is handling at well.  -She loves to meet new people through her work.  -she has started a new piercing job.     Pain Inventory Average Pain 9 Pain Right Now 8 My pain is intermittent, sharp, burning, dull, stabbing, tingling, and aching  In the last 24 hours, has pain interfered with the following? General activity 8 Relation with others 8 Enjoyment of life 10 What TIME of day is your pain at its worst? Morning and night Sleep (in general) Fair  Pain is worse with: walking, bending, sitting, and standing, some activities Pain improves with: rest, heat/ice, and medication Relief from Meds: 35  Family History  Problem Relation Age of Onset   Lung cancer Father    Stomach cancer Father    Esophageal cancer Father    Alcohol abuse Father    Mental illness Father    Diabetes Sister    Hypertension Sister    Bipolar disorder Sister    Schizophrenia Sister    Diabetes Sister    Alcohol abuse Brother    Hypertension Brother    Kidney disease Brother    Diabetes Brother    Drug abuse Brother    Mental illness Brother    Alcohol abuse Brother    Alcohol abuse Brother    Hypertension Brother    Diabetes Brother    Alcohol abuse Brother    Physical abuse Mother    Alcohol abuse Mother    Cirrhosis Mother    Mental illness Brother  in [place] abuse Sister    HIV Sister    Alcohol abuse Brother    Pneumonia Sister        died as a baby   Alcohol abuse Brother    Bipolar disorder Brother    Bipolar disorder Daughter    Bipolar disorder Son    Bipolar disorder Son    Hypertension Brother    Bipolar disorder Brother    Drug abuse Brother    Alcohol abuse Brother    Bipolar disorder Brother    ADD / ADHD Neg Hx    Anxiety disorder Neg Hx    Dementia Neg Hx    Depression Neg Hx    OCD Neg Hx    Seizures Neg Hx    Paranoid behavior Neg Hx    Colon cancer Neg Hx    Social History   Socioeconomic History   Marital status:  Married    Spouse name: Not on file   Number of children: 4   Years of education: Not on file   Highest education level: Not on file  Occupational History   Not on file  Tobacco Use   Smoking status: Never   Smokeless tobacco: Never  Vaping Use   Vaping Use: Never used  Substance and Sexual Activity   Alcohol use: No   Drug use: No   Sexual activity: Not Currently    Partners: Male    Birth control/protection: Surgical    Comment: tubal  Other Topics Concern   Not on file  Social History Narrative   Not on file   Social Determinants of Health   Financial Resource Strain: Low Risk    Difficulty of Paying Living Expenses: Not very hard  Food Insecurity: No Food Insecurity   Worried About Charity fundraiser in the Last Year: Never true   Ran Out of Food in the Last Year: Never true  Transportation Needs: No Transportation Needs   Lack of Transportation (Medical): No   Lack of Transportation (Non-Medical): No  Physical Activity: Insufficiently Active   Days of Exercise per Week: 4 days   Minutes of Exercise per Session: 30 min  Stress: Stress Concern Present   Feeling of Stress : To some extent  Social Connections: Socially Isolated   Frequency of Communication with Friends and Family: Three times a week   Frequency of Social Gatherings with Friends and Family: Twice a week   Attends Religious Services: Never   Marine scientist or Organizations: No   Attends Music therapist: Never   Marital Status: Separated   Past Surgical History:  Procedure Laterality Date   ANTERIOR CERVICAL DECOMP/DISCECTOMY FUSION  07/07/2012   Procedure: ANTERIOR CERVICAL DECOMPRESSION/DISCECTOMY FUSION 2 LEVELS;  Surgeon: Floyce Stakes, MD;  Location: [facility];  Service: Neurosurgery;  Laterality: N/A;  Cervical four-five, five - six  Anterior cervical decompression/diskectomy/fusion/plate   APPENDECTOMY  1986   BOWEL RESECTION N/A 07/29/2013   Procedure: serosal  repair;  Surgeon: Adin Hector, MD;  Location: [facility];  Service: General;  Laterality: N/A;   CARPAL TUNNEL RELEASE Bilateral    COLON SURGERY N/A    Phreesia 07/29/2020   LAPAROSCOPY N/A 07/29/2013   Procedure: diagnostic laporoscopy;  Surgeon: Adin Hector, MD;  Location: [facility];  Service: General;  Laterality: N/A;   LAPAROSCOPY N/A 08/16/2013   Procedure: LAPAROSCOPY DIAGNOSTIC/LYSIS OF ADHESIONS;  Surgeon: Adin Hector, MD;  Location: [facility];  Service:  General;  Laterality: N/A;   LAPAROTOMY N/A 08/16/2013   Procedure: EXPLORATORY LAPAROTOMY/SMALL BOWEL RESECTION (JEJUNUM);  Surgeon: Adin Hector, MD;  Location: [facility];  Service: General;  Laterality: N/A;   LUMBAR SPINE SURGERY  2010   x 3   LYSIS OF ADHESION  2003   Dr. Irving Shows   LYSIS OF ADHESION N/A 07/29/2013   Procedure: LYSIS OF ADHESION;  Surgeon: Adin Hector, MD;  Location: [facility];  Service: General;  Laterality: N/A;   [place]?   [place], [place]   SPINAL CORD STIMULATOR IMPLANT     SPINE SURGERY N/A    Phreesia 07/29/2020   TRIGGER FINGER RELEASE  2009   right pinkie finger   TUBAL LIGATION  1994   Past Surgical History:  Procedure Laterality Date   ANTERIOR CERVICAL DECOMP/DISCECTOMY FUSION  07/07/2012   Procedure: ANTERIOR CERVICAL DECOMPRESSION/DISCECTOMY FUSION 2 LEVELS;  Surgeon: Floyce Stakes, MD;  Location: [facility];  Service: Neurosurgery;  Laterality: N/A;  Cervical four-five, five - six  Anterior cervical decompression/diskectomy/fusion/plate   APPENDECTOMY  1986   BOWEL RESECTION N/A 07/29/2013   Procedure: serosal repair;  Surgeon: Adin Hector, MD;  Location: [facility];  Service: General;  Laterality: N/A;   CARPAL TUNNEL RELEASE Bilateral    COLON SURGERY N/A    Phreesia 07/29/2020   LAPAROSCOPY N/A 07/29/2013   Procedure: diagnostic laporoscopy;  Surgeon: Adin Hector, MD;  Location: [facility];  Service: General;  Laterality: N/A;    LAPAROSCOPY N/A 08/16/2013   Procedure: LAPAROSCOPY DIAGNOSTIC/LYSIS OF ADHESIONS;  Surgeon: Adin Hector, MD;  Location: [facility];  Service: General;  Laterality: N/A;   LAPAROTOMY N/A 08/16/2013   Procedure: EXPLORATORY LAPAROTOMY/SMALL BOWEL RESECTION (JEJUNUM);  Surgeon: Adin Hector, MD;  Location: [facility];  Service: General;  Laterality: N/A;   LUMBAR SPINE SURGERY  2010   x 3   LYSIS OF ADHESION  2003   Dr. Irving Shows   LYSIS OF ADHESION N/A 07/29/2013   Procedure: LYSIS OF ADHESION;  Surgeon: Adin Hector, MD;  Location: [facility];  Service: General;  Laterality: N/A;   [place]?   [place], [place]   SPINAL CORD STIMULATOR IMPLANT     SPINE SURGERY N/A    Phreesia 07/29/2020   TRIGGER FINGER RELEASE  2009   right pinkie finger   TUBAL LIGATION  1994   Past Medical History:  Diagnosis Date   Anemia    Asthma    Asthma flare 04/09/2013   Back pain    Bronchitis    Chronic abdominal pain    Chronic constipation    Constipation due to opioid therapy    Depression    Depression, major, single episode, severe ([place]) 10/03/2018   PHQ 9 score of 15   Diabetes mellitus without complication ([place])    Diabetes mellitus, type II ([place])    DVT (deep venous thrombosis) ([place]) 2010   GERD (gastroesophageal reflux disease)    Heart murmur    no cardiologist   Helicobacter pylori gastritis 06/11/2013   Colonoscopy Dr. Hilarie Fredrickson   Hypertension    IBS (irritable bowel syndrome)    Migraine headache    Neuropathy    Obesity    Obsessive-compulsive disorder    PSYCHOTIC D/O W/HALLUCINATIONS CONDS CLASS ELSW 03/04/2010   Qualifier: Diagnosis of  By: Moshe Cipro MD, Margaret     PTSD (post-traumatic stress  disorder)    SBO (small bowel obstruction) ([place]) 08/09/2013   Seasonal allergies 12/10/2012   Seizures ([facility])    Shortness of breath    BP 128/79   Pulse 89   Temp 99 F (37.2 C)   Resp (!) 96   Ht 5\' 6"  (1.676 m)   Wt 195 lb 12.8 oz (88.8 kg)   BMI  31.60 kg/m   Opioid Risk Score:   Fall Risk Score:  `1  Depression screen PHQ 2/9  Depression screen [facility] 2/9 10/09/2021 09/11/2021 07/12/2021 06/11/2021 05/14/2021 05/08/2021 04/09/2021  Decreased Interest 0 0 0 0 1 0 0  Down, Depressed, Hopeless 0 0 0 0 0 0 0  PHQ - 2 Score 0 0 0 0 1 0 0  Altered sleeping - - - - 2 - -  Tired, decreased energy - - - - 1 - -  Change in appetite - - - - 2 - -  Feeling bad or failure about yourself  - - - - 1 - -  Trouble concentrating - - - - 2 - -  Moving slowly or fidgety/restless - - - - 1 - -  Suicidal thoughts - - - - 0 - -  PHQ-9 Score - - - - 10 - -  Difficult doing work/chores - - - - Somewhat difficult - -  Some encounter information is confidential and restricted. Go to Review Flowsheets activity to see all data.  Some recent data might be hidden    Review of Systems  Constitutional: Negative.   HENT: Negative.    Eyes: Negative.   Respiratory: Negative.    Cardiovascular: Negative.   Gastrointestinal: Negative.   Endocrine: Negative.   Genitourinary: Negative.   Musculoskeletal:  Positive for back pain.       Pain in both knee & both shoulders  Skin: Negative.   Allergic/Immunologic: Negative.   Neurological: Negative.   Hematological: Negative.   Psychiatric/Behavioral:  Positive for dysphoric mood.   All other systems reviewed and are negative.     Objective:   Physical Exam Gen: no distress, normal appearing HEENT: oral mucosa pink and moist, NCAT Cardio: Reg rate Chest: normal effort, normal rate of breathing Abd: soft, non-distended Ext: no edema Psych: pleasant, normal affect Musculoskeletal:     Cervical back: Normal range of motion and neck supple.     Comments: Normal Muscle Bulk and Muscle Testing Reveals:  Upper Extremities: Full ROM and Muscle Strength 5/5 Lumbar Paraspinal Tenderness: L-4-L-5 Lower Extremities: Full ROM and Muscle Strength 5/5 Arises from chair with ease Narrow Based  Gait     Skin:     General: Skin is warm and dry.  Neurological:     Mental Status: She is alert and oriented to person, place, and time.  Psychiatric:        Mood and Affect: Mood normal.        Behavior: Behavior normal.        Assessment & Plan:  1. Chronic Pain of Bilateral Shoulders L>R: Continue HEP as Tolerated. Continue to Monitor.  2. Failed Back Syndrome of Lumbar Syndrome : Continue HEP as Tolerated. Continue current medication regimen. Continue to Monitor.  -Discussed current symptoms of pain and history of pain.  -Discussed benefits of exercise in reducing pain. -Discussed following foods that may reduce pain: 1) Ginger (especially studied for arthritis)- reduce leukotriene production to decrease inflammation 2) Blueberries- high in phytonutrients that decrease inflammation 3) Salmon- marine omega-3s reduce joint swelling and pain  4) Pumpkin seeds- reduce inflammation 5) dark chocolate- reduces inflammation 6) turmeric- reduces inflammation 7) tart cherries - reduce pain and stiffness 8) extra virgin olive oil - its compound olecanthal helps to block prostaglandins  9) chili peppers- can be eaten or applied topically via capsaicin 10) mint- helpful for headache, muscle aches, joint pain, and itching 11) garlic- reduces inflammation  Link to further information on diet for chronic pain: [email or web address]  -Provided with a pain relief journal and discussed that it contains foods and lifestyle tips to naturally help to improve pain. Discussed that these lifestyle strategies are also very good for health unlike some medications which can have negative side effects. Discussed that the act of keeping a journal can be therapeutic and helpful to realize patterns what helps to trigger and alleviate pain.  l 3. Chronic Lower Back Pain without Sciatica: Continue HEP as Tolerated. Continue to Monitor. Referred to Aqua therapy 4.  Left Knee Pain: No Complaints today. Continue HEP as Tolerated. Continue to Monitor. Discussed Wobenzymes.  5. Muscle Spasm of Shoulders/ Upper Back : Continue Flexeril. Continue to Monitor. 6. Chronic Pain Syndrome: Refilled: Oxycodone 5mg /325 mg two tablets twice a day #120. We will continue the opioid monitoring program, this consists of regular clinic visits, examinations, urine drug screen, pill counts as well as use of [place] Controlled Substance Reporting system.  7. Constipation:  -Continue Linzess as prescribed by GI -Provided list of following foods that help with constipation and highlighted a few: 1) prunes- contain high amounts of fiber.  2) apples- has a form of dietary fiber called pectin that accelerates stool movement and increases beneficial gut bacteria 3) pears- in addition to fiber, also high in fructose and sorbitol which have laxative effect 4) figs- contain an enzyme ficin which helps to speed colonic transit 5) kiwis- contain an enzyme actinidin that improves gut motility and reduces constipation 6) oranges- rich in pectin (like apples) 7) grapefruits- contain a flavanol naringenin which has a laxative effect 8) vegetables- rich in fiber and also great sources of folate, vitamin C, and K 9) artichoke- high in inulin, prebiotic great for the microbiome 10) chicory- increases stool frequency and softness (can be added to coffee) 11) rhubarb- laxative effect 12) sweet potato- high fiber 13) beans, peas, and lentils- contain both soluble and insoluble fiber 14) chia seeds- improves intestinal health and gut flora 15) flaxseeds- laxative effect 16) whole grain rye bread- high in fiber 17) oat bran- high in soluble and insoluble fiber 18) kefir- softens stools -recommended to try at least one of these foods every day.  -drink 6-8 glasses of water per day -walk regularly, especially after meals.   8) Anxiety: -Continue alprazolam as prescribed by Dr. Moshe Cipro.  Appreciate Eunice's counseling regarding increased risk of side effects with use of Alprazolam and Oxycodone.  -Discussed exercise and meditation as tools to decrease anxiety. -Recommended Down Dog Yoga app -Discussed spending time outdoors. -Discussed positive re-framing of anxiety.  -Discussed the following foods that have been show to reduce anxiety: 1) [place] nuts, mushrooms, soy beans due to their high selenium content. Upper limit of toxicity of selenium is 480mcg/day so no more than 3-4 [place] nuts per day.  2) Fatty fish such as salmon, mackerel, sardines, trout, and herring- high in omega-3 fatty acids 3) Eggs- increases serotonin and dopamine 4) Pumpkin seeds- high in omega-3 fatty acids 5) dark chocolate- high in flavanols that increase blood flow to brain 6) turmeric- take with black pepper to increase  absorption 7) chamomile tea- antioxidant and anti-inflammatory properties 8) yogurt without sugar- supports gut-brain axis 9) green tea- contains L- theanine 10) blueberries- high in vitamin C and antioxidants 11) [place]- high in tryptophan which gets converted to serotonin 12) bell peppers- rich in vitamin C and antioxidants 13) citrus fruits- rich in vitamin C and antioxidants 14) almonds- high in vitamin E and healthy fats 15) chia seeds- high in omega-3 fatty acids  9) Prediabetes -HgbA1c reviewed- 6.3 -check CBGs daily, log, and bring log to follow-up appointment -avoid sugar, bread, pasta, rice -avoid snacking -try to incorporate into your diet some of the following foods which are good for diabetes: 1) cinnamon- imitates effects of insulin, increasing glucose transport into cells ([place] or [place] cinnamon is best, least processed) 2) nuts- can slow down the blood sugar response of carbohydrate rich foods 3) oatmeal- contains and anti-inflammatory compound avenanthramide 4) whole-milk yogurt (best types are no sugar, [place] yogurt, or goat/sheep yogurt) 5) beans-  high in protein, fiber, and vitamins, low glycemic index 6) broccoli- great source of vitamin A and C 7) quinoa- higher in protein and fiber than other grains 8) spinach- high in vitamin A, fiber, and protein 9) olive oil- reduces glucose levels, LDL, and triglycerides 10) salmon- excellent amount of omega-3-fatty acids 11) walnuts- rich in antioxidants 12) apples- high in fiber and quercetin 13) carrots- highly nutritious with low impact on blood sugar 14) eggs- improve HDL (good cholesterol), high in protein, keep you satiated 15) turmeric: improves blood sugars, cardiovascular disease, and protects kidney health 16) garlic: improves blood sugar, blood pressure, pain 17) tomatoes: highly nutritious with low impact on blood sugar   10) Obesity: -Educated regarding health benefits of weight loss- for pain, general health, chronic disease prevention, immune health, mental health.  -prescribed magnesium gluconate 250mg  HS -Will monitor weight every visit.  -Consider [place] tea daily.  -Discussed the benefits of intermittent fasting. -Discussed foods that can assist in weight loss: 1) leafy greens- high in fiber and nutrients 2) dark chocolate- improves metabolism (if prefer sweetened, best to sweeten with honey instead of sugar).  3) cruciferous vegetables- high in fiber and protein 4) full fat yogurt: high in healthy fat, protein, calcium, and probiotics 5) apples- high in a variety of phytochemicals 6) nuts- high in fiber and protein that increase feelings of fullness 7) grapefruit: rich in nutrients, antioxidants, and fiber (not to be taken with anticoagulation) 8) beans- high in protein and fiber 9) salmon- has high quality protein and healthy fats 10) green tea- rich in polyphenols 11) eggs- rich in choline and vitamin D 12) tuna- high protein, boosts metabolism 13) avocado- decreases visceral abdominal fat 14) chicken (pasture raised): high in protein and iron 15) blueberries-  reduce abdominal fat and cholesterol 16) whole grains- decreases calories retained during digestion, speeds metabolism 17) chia seeds- English appetite 18) chilies- increases fat metabolism  -Discussed supplements that can be used:  1) Metatrim 400mg  BID 30 minutes before breakfast and dinner  2) Sphaeranthus indicus and Garcinia mangostana (combinations of these and #1 can be found in capsicum and zychrome  3) green coffee bean extract 400mg  twice per day or [place] (african mango) 150 to 300mg  twice per day.

## 2021-10-09 NOTE — Patient Instructions (Signed)
Foods that may reduce pain: 1) Ginger (especially studied for arthritis)- reduce leukotriene production to decrease inflammation 2) Blueberries- high in phytonutrients that decrease inflammation 3) Salmon- marine omega-3s reduce joint swelling and pain 4) Pumpkin seeds- reduce inflammation 5) dark chocolate- reduces inflammation 6) turmeric- reduces inflammation 7) tart cherries - reduce pain and stiffness 8) extra virgin olive oil - its compound olecanthal helps to block prostaglandins  9) chili peppers- can be eaten or applied topically via capsaicin 10) mint- helpful for headache, muscle aches, joint pain, and itching 11) garlic- reduces inflammation  Link to further information on diet for chronic pain: [email or web address].[email or web address]   Wobenzymes

## 2021-10-09 NOTE — Progress Notes (Deleted)
Subjective:    Patient ID: Jane English, female    DOB: 09-17-1971, 50 y.o.   MRN: [id number]  HPI: Jane English is a 50 y.o. female who returns for follow-up appointment for chronic pain in her back secondary to failed back syndrome, bilateral knees secondary to chonrdomalacia of bilateral knees, pain in bilateral hands secondary to osteoarthritis, oxycodone refill.   1) Failed back syndrome -Her current exercise regime is walking and performing stretching exercises. -Last UDS was Performed on 01/26/2021, it was consistent.  -She has been taking oxycodone PRN (30MME) for years- it is helpful for her pain -she has had negative responses with Gabapentin and Lyrica.  -pain has been really severe recently.  -she has never tried Cymbalta.   2) Chondromalacia of bilateral knees  3) Anxiety -she receives Alprazolam from Dr. Moshe Cipro, PCP- dose was recently reduced in half.  Jane English has discussed the black box warning of using opioids and benzodiazepines and highlighted the dangers of using these drugs together and discussed the adverse events including respiratory suppression, overdose, cognitive impairment and importance of compliance with current regimen.  -she has been referred to therapy by her PCP.   4) Osteoarthritis of bilateral hands  5) Right buttock pain  6) Myofascial pain  7) Chronic constipation: -has followed with GI since last visit  8) Prediabetes -she was advised by GI to discuss Wegovy vs. Ozempic with her PCP for weight loss- will also help with diabetes.  -there is plan for repeat colostomy- she had a bad experience with this last time.   9) Nausea: she does feel some some nausea with her pain medications.     Pain Inventory Average Pain 10 Pain Right Now 8 My pain is intermittent, sharp, burning, dull, stabbing, tingling, and aching  In the last 24 hours, has pain interfered with the following? General activity 8 Relation with others 9 Enjoyment of life  10 What TIME of day is your pain at its worst? daytime and evening Sleep (in general) Fair  Pain is worse with: walking, bending, sitting, and standing Pain improves with: rest, heat/ice, and medication Relief from Meds: 10  Family History  Problem Relation Age of Onset  . Lung cancer Father   . Stomach cancer Father   . Esophageal cancer Father   . Alcohol abuse Father   . Mental illness Father   . Diabetes Sister   . Hypertension Sister   . Bipolar disorder Sister   . Schizophrenia Sister   . Diabetes Sister   . Alcohol abuse Brother   . Hypertension Brother   . Kidney disease Brother   . Diabetes Brother   . Drug abuse Brother   . Mental illness Brother   . Alcohol abuse Brother   . Alcohol abuse Brother   . Hypertension Brother   . Diabetes Brother   . Alcohol abuse Brother   . Physical abuse Mother   . Alcohol abuse Mother   . Cirrhosis Mother   . Mental illness Brother        in [place]  . Drug abuse Sister   . HIV Sister   . Alcohol abuse Brother   . Pneumonia Sister        died as a baby  . Alcohol abuse Brother   . Bipolar disorder Brother   . Bipolar disorder Daughter   . Bipolar disorder Son   . Bipolar disorder Son   . Hypertension Brother   . Bipolar disorder Brother   .  Drug abuse Brother   . Alcohol abuse Brother   . Bipolar disorder Brother   . ADD / ADHD Neg Hx   . Anxiety disorder Neg Hx   . Dementia Neg Hx   . Depression Neg Hx   . OCD Neg Hx   . Seizures Neg Hx   . Paranoid behavior Neg Hx   . Colon cancer Neg Hx    Social History   Socioeconomic History  . Marital status: Married    Spouse name: Not on file  . Number of children: 4  . Years of education: Not on file  . Highest education level: Not on file  Occupational History  . Not on file  Tobacco Use  . Smoking status: Never  . Smokeless tobacco: Never  Vaping Use  . Vaping Use: Never used  Substance and Sexual Activity  . Alcohol use: No  . Drug use: No  . Sexual  activity: Not Currently    Partners: Male    Birth control/protection: Surgical    Comment: tubal  Other Topics Concern  . Not on file  Social History Narrative  . Not on file   Social Determinants of Health   Financial Resource Strain: Low Risk   . Difficulty of Paying Living Expenses: Not very hard  Food Insecurity: No Food Insecurity  . Worried About Charity fundraiser in the Last Year: Never true  . Ran Out of Food in the Last Year: Never true  Transportation Needs: No Transportation Needs  . Lack of Transportation (Medical): No  . Lack of Transportation (Non-Medical): No  Physical Activity: Insufficiently Active  . Days of Exercise per Week: 4 days  . Minutes of Exercise per Session: 30 min  Stress: Stress Concern Present  . Feeling of Stress : To some extent  Social Connections: Socially Isolated  . Frequency of Communication with Friends and Family: Three times a week  . Frequency of Social Gatherings with Friends and Family: Twice a week  . Attends Religious Services: Never  . Active Member of Clubs or Organizations: No  . Attends Archivist Meetings: Never  . Marital Status: Separated   Past Surgical History:  Procedure Laterality Date  . ANTERIOR CERVICAL DECOMP/DISCECTOMY FUSION  07/07/2012   Procedure: ANTERIOR CERVICAL DECOMPRESSION/DISCECTOMY FUSION 2 LEVELS;  Surgeon: Floyce Stakes, MD;  Location: [facility];  Service: Neurosurgery;  Laterality: N/A;  Cervical four-five, five - six  Anterior cervical decompression/diskectomy/fusion/plate  . APPENDECTOMY  1986  . BOWEL RESECTION N/A 07/29/2013   Procedure: serosal repair;  Surgeon: Adin Hector, MD;  Location: [facility];  Service: General;  Laterality: N/A;  . CARPAL TUNNEL RELEASE Bilateral   . COLON SURGERY N/A    Phreesia 07/29/2020  . LAPAROSCOPY N/A 07/29/2013   Procedure: diagnostic laporoscopy;  Surgeon: Adin Hector, MD;  Location: [facility];  Service: General;  Laterality: N/A;  .  LAPAROSCOPY N/A 08/16/2013   Procedure: LAPAROSCOPY DIAGNOSTIC/LYSIS OF ADHESIONS;  Surgeon: Adin Hector, MD;  Location: [facility];  Service: General;  Laterality: N/A;  . LAPAROTOMY N/A 08/16/2013   Procedure: EXPLORATORY LAPAROTOMY/SMALL BOWEL RESECTION (JEJUNUM);  Surgeon: Adin Hector, MD;  Location: [facility];  Service: General;  Laterality: N/A;  . LUMBAR SPINE SURGERY  2010   x 3  . LYSIS OF ADHESION  2003   Dr. Irving Shows  . LYSIS OF ADHESION N/A 07/29/2013   Procedure: LYSIS OF ADHESION;  Surgeon: Adin Hector, MD;  Location:  [facility];  Service: General;  Laterality: N/A;  . OOPHORECTOMY    . PARTIAL HYSTERECTOMY  1990s?   [place]  . SPINAL CORD STIMULATOR IMPLANT    . SPINE SURGERY N/A    Phreesia 07/29/2020  . TRIGGER FINGER RELEASE  2009   right pinkie finger  . TUBAL LIGATION  1994   Past Surgical History:  Procedure Laterality Date  . ANTERIOR CERVICAL DECOMP/DISCECTOMY FUSION  07/07/2012   Procedure: ANTERIOR CERVICAL DECOMPRESSION/DISCECTOMY FUSION 2 LEVELS;  Surgeon: Floyce Stakes, MD;  Location: [facility];  Service: Neurosurgery;  Laterality: N/A;  Cervical four-five, five - six  Anterior cervical decompression/diskectomy/fusion/plate  . APPENDECTOMY  1986  . BOWEL RESECTION N/A 07/29/2013   Procedure: serosal repair;  Surgeon: Adin Hector, MD;  Location: [facility];  Service: General;  Laterality: N/A;  . CARPAL TUNNEL RELEASE Bilateral   . COLON SURGERY N/A    Phreesia 07/29/2020  . LAPAROSCOPY N/A 07/29/2013   Procedure: diagnostic laporoscopy;  Surgeon: Adin Hector, MD;  Location: [facility];  Service: General;  Laterality: N/A;  . LAPAROSCOPY N/A 08/16/2013   Procedure: LAPAROSCOPY DIAGNOSTIC/LYSIS OF ADHESIONS;  Surgeon: Adin Hector, MD;  Location: [facility];  Service: General;  Laterality: N/A;  . LAPAROTOMY N/A 08/16/2013   Procedure: EXPLORATORY LAPAROTOMY/SMALL BOWEL RESECTION (JEJUNUM);  Surgeon: Adin Hector, MD;  Location: [facility];   Service: General;  Laterality: N/A;  . LUMBAR SPINE SURGERY  2010   x 3  . LYSIS OF ADHESION  2003   Dr. Irving Shows  . LYSIS OF ADHESION N/A 07/29/2013   Procedure: LYSIS OF ADHESION;  Surgeon: Adin Hector, MD;  Location: [facility];  Service: General;  Laterality: N/A;  . OOPHORECTOMY    . PARTIAL HYSTERECTOMY  1990s?   , [place]  . SPINAL CORD STIMULATOR IMPLANT    . SPINE SURGERY N/A    Phreesia 07/29/2020  . TRIGGER FINGER RELEASE  2009   right pinkie finger  . TUBAL LIGATION  1994   Past Medical History:  Diagnosis Date  . Anemia   . Asthma   . Asthma flare 04/09/2013  . Back pain   . Bronchitis   . Chronic abdominal pain   . Chronic constipation   . Constipation due to opioid therapy   . Depression   . Depression, major, single episode, severe ([place]) 10/03/2018   PHQ 9 score of 15  . Diabetes mellitus without complication ([place])   . Diabetes mellitus, type II ([place])   . DVT (deep venous thrombosis) ([place]) 2010  . GERD (gastroesophageal reflux disease)   . Heart murmur    no cardiologist  . Helicobacter pylori gastritis 06/11/2013   Colonoscopy Dr. Hilarie Fredrickson  . Hypertension   . IBS (irritable bowel syndrome)   . Migraine headache   . Neuropathy   . Obesity   . Obsessive-compulsive disorder   . PSYCHOTIC D/O W/HALLUCINATIONS CONDS CLASS ELSW 03/04/2010   Qualifier: Diagnosis of  By: Moshe Cipro MD, Joycelyn Schmid    . PTSD (post-traumatic stress disorder)   . SBO (small bowel obstruction) ([place]) 08/09/2013  . Seasonal allergies 12/10/2012  . Seizures ([place])   . Shortness of breath    BP 128/79   Pulse 89   Temp 99 F (37.2 C)   Resp (!) 96   Ht 5\' 6"  (1.676 m)   Wt 195 lb 12.8 oz (88.8 kg)   BMI 31.60 kg/m   Opioid Risk Score:   Fall Risk Score:  `  1  Depression screen PHQ 2/9  Depression screen [facility] 2/9 10/09/2021 09/11/2021 07/12/2021 06/11/2021 05/14/2021 05/08/2021 04/09/2021  Decreased Interest 0 0 0 0 1 0 0  Down, Depressed, Hopeless 0 0 0 0 0 0 0  PHQ - 2 Score 0 0 0 0 1  0 0  Altered sleeping - - - - 2 - -  Tired, decreased energy - - - - 1 - -  Change in appetite - - - - 2 - -  Feeling bad or failure about yourself  - - - - 1 - -  Trouble concentrating - - - - 2 - -  Moving slowly or fidgety/restless - - - - 1 - -  Suicidal thoughts - - - - 0 - -  PHQ-9 Score - - - - 10 - -  Difficult doing work/chores - - - - Somewhat difficult - -  Some encounter information is confidential and restricted. Go to Review Flowsheets activity to see all data.  Some recent data might be hidden    Review of Systems  Musculoskeletal:  Positive for back pain.       Pain in both knee & both shoulders  All other systems reviewed and are negative.     Objective:   Physical Exam Gen: no distress, normal appearing HEENT: oral mucosa pink and moist, NCAT Cardio: Reg rate Chest: normal effort, normal rate of breathing Abd: soft, non-distended Ext: no edema Psych: pleasant, normal affect Musculoskeletal:     Cervical back: Normal range of motion and neck supple.     Comments: Normal Muscle Bulk and Muscle Testing Reveals:  Upper Extremities: Full ROM and Muscle Strength 5/5 Lumbar Paraspinal Tenderness: L-4-L-5 Lower Extremities: Full ROM and Muscle Strength 5/5 Arises from chair with ease Narrow Based  Gait     Skin:    General: Skin is warm and dry.  Neurological:     Mental Status: She is alert and oriented to person, place, and time.  Psychiatric:        Mood and Affect: Mood normal.        Behavior: Behavior normal.        Assessment & Plan:  1. Chronic Pain of Bilateral Shoulders L>R: Continue HEP as Tolerated. Continue to Monitor.  2. Failed Back Syndrome of Lumbar Syndrome : Continue HEP as Tolerated. Continue current medication regimen. Continue to Monitor.  -Discussed current symptoms of pain and history of pain.  -Discussed benefits of exercise in reducing pain. -Discussed following foods that may reduce pain: 1) Ginger (especially studied for  arthritis)- reduce leukotriene production to decrease inflammation 2) Blueberries- high in phytonutrients that decrease inflammation 3) Salmon- marine omega-3s reduce joint swelling and pain 4) Pumpkin seeds- reduce inflammation 5) dark chocolate- reduces inflammation 6) turmeric- reduces inflammation 7) tart cherries - reduce pain and stiffness 8) extra virgin olive oil - its compound olecanthal helps to block prostaglandins  9) chili peppers- can be eaten or applied topically via capsaicin 10) mint- helpful for headache, muscle aches, joint pain, and itching 11) garlic- reduces inflammation  Link to further information on diet for chronic pain: http://[email or web address].com/  Provided with a Pain Relief Journal 3. Chronic Lower Back Pain without Sciatica: Continue HEP as Tolerated. Continue to Monitor. 4. Left Knee Pain: No Complaints today. Continue HEP as Tolerated. Continue to Monitor. 5. Muscle Spasm of Shoulders/ Upper Back : Continue Flexeril. Continue to Monitor. 6. Chronic Pain Syndrome: Refilled: Oxycodone 5mg /325 mg two tablets twice a day #120. We  will continue the opioid monitoring program, this consists of regular clinic visits, examinations, urine drug screen, pill counts as well as use of [place] Controlled Substance Reporting system.  7. Constipation:  -Continue Linzess as prescribed by GI -Provided list of following foods that help with constipation and highlighted a few: 1) prunes- contain high amounts of fiber.  2) apples- has a form of dietary fiber called pectin that accelerates stool movement and increases beneficial gut bacteria 3) pears- in addition to fiber, also high in fructose and sorbitol which have laxative effect 4) figs- contain an enzyme ficin which helps to speed colonic transit 5) kiwis- contain an enzyme actinidin that improves gut motility and reduces constipation 6) oranges- rich in  pectin (like apples) 7) grapefruits- contain a flavanol naringenin which has a laxative effect 8) vegetables- rich in fiber and also great sources of folate, vitamin C, and K 9) artichoke- high in inulin, prebiotic great for the microbiome 10) chicory- increases stool frequency and softness (can be added to coffee) 11) rhubarb- laxative effect 12) sweet potato- high fiber 13) beans, peas, and lentils- contain both soluble and insoluble fiber 14) chia seeds- improves intestinal health and gut flora 15) flaxseeds- laxative effect 16) whole grain rye bread- high in fiber 17) oat bran- high in soluble and insoluble fiber 18) kefir- softens stools -recommended to try at least one of these foods every day.  -drink 6-8 glasses of water per day -walk regularly, especially after meals.   8) Anxiety: -Continue alprazolam as prescribed by Dr. Moshe Cipro. Appreciate Eunice's counseling regarding increased risk of side effects with use of Alprazolam and Oxycodone.  -Discussed exercise and meditation as tools to decrease anxiety. -Recommended Down Dog Yoga app -Discussed spending time outdoors. -Discussed positive re-framing of anxiety.  -Discussed the following foods that have been show to reduce anxiety: 1) [place] nuts, mushrooms, soy beans due to their high selenium content. Upper limit of toxicity of selenium is 428mcg/day so no more than 3-4 [place] nuts per day.  2) Fatty fish such as salmon, mackerel, sardines, trout, and herring- high in omega-3 fatty acids 3) Eggs- increases serotonin and dopamine 4) Pumpkin seeds- high in omega-3 fatty acids 5) dark chocolate- high in flavanols that increase blood flow to brain 6) turmeric- take with black pepper to increase absorption 7) chamomile tea- antioxidant and anti-inflammatory properties 8) yogurt without sugar- supports gut-brain axis 9) green tea- contains L- theanine 10) blueberries- high in vitamin C and antioxidants 11) [place]- high in  tryptophan which gets converted to serotonin 12) bell peppers- rich in vitamin C and antioxidants 13) citrus fruits- rich in vitamin C and antioxidants 14) almonds- high in vitamin E and healthy fats 15) chia seeds- high in omega-3 fatty acids  9) Prediabetes -HgbA1c reviewed- 6.3 -check CBGs daily, log, and bring log to follow-up appointment -avoid sugar, bread, pasta, rice -avoid snacking -try to incorporate into your diet some of the following foods which are good for diabetes: 1) cinnamon- imitates effects of insulin, increasing glucose transport into cells ([place] or [place] cinnamon is best, least processed) 2) nuts- can slow down the blood sugar response of carbohydrate rich foods 3) oatmeal- contains and anti-inflammatory compound avenanthramide 4) whole-milk yogurt (best types are no sugar, [place] yogurt, or goat/sheep yogurt) 5) beans- high in protein, fiber, and vitamins, low glycemic index 6) broccoli- great source of vitamin A and C 7) quinoa- higher in protein and fiber than other grains 8) spinach- high in vitamin A, fiber, and  protein 9) olive oil- reduces glucose levels, LDL, and triglycerides 10) salmon- excellent amount of omega-3-fatty acids 11) walnuts- rich in antioxidants 12) apples- high in fiber and quercetin 13) carrots- highly nutritious with low impact on blood sugar 14) eggs- improve HDL (good cholesterol), high in protein, keep you satiated 15) turmeric: improves blood sugars, cardiovascular disease, and protects kidney health 16) garlic: improves blood sugar, blood pressure, pain 17) tomatoes: highly nutritious with low impact on blood sugar   10) Obesity: -Educated regarding health benefits of weight loss- for pain, general health, chronic disease prevention, immune health, mental health.  -prescribed magnesium gluconate 250mg  HS -Will monitor weight every visit.  -Consider [place] tea daily.  -Discussed the benefits of intermittent  fasting. -Discussed foods that can assist in weight loss: 1) leafy greens- high in fiber and nutrients 2) dark chocolate- improves metabolism (if prefer sweetened, best to sweeten with honey instead of sugar).  3) cruciferous vegetables- high in fiber and protein 4) full fat yogurt: high in healthy fat, protein, calcium, and probiotics 5) apples- high in a variety of phytochemicals 6) nuts- high in fiber and protein that increase feelings of fullness 7) grapefruit: rich in nutrients, antioxidants, and fiber (not to be taken with anticoagulation) 8) beans- high in protein and fiber 9) salmon- has high quality protein and healthy fats 10) green tea- rich in polyphenols 11) eggs- rich in choline and vitamin D 12) tuna- high protein, boosts metabolism 13) avocado- decreases visceral abdominal fat 14) chicken (pasture raised): high in protein and iron 15) blueberries- reduce abdominal fat and cholesterol 16) whole grains- decreases calories retained during digestion, speeds metabolism 17) chia seeds- English appetite 18) chilies- increases fat metabolism  -Discussed supplements that can be used:  1) Metatrim 400mg  BID 30 minutes before breakfast and dinner  2) Sphaeranthus indicus and Garcinia mangostana (combinations of these and #1 can be found in capsicum and zychrome  3) green coffee bean extract 400mg  twice per day or Irvingia (african mango) 150 to 300mg  twice per day.     F/U in 1 month  with [facility] x3, with me in 3 months.   40 minutes spent in review of patient's prior note, PDMP, medications, review of GI note, review of PCP note, review of HgbA1c, review of her weight, discussion of foods that can help with her conditions, provided with pain relief journal, her love for her kids.

## 2021-10-11 ENCOUNTER — Encounter: Payer: Self-pay | Admitting: Certified Registered Nurse Anesthetist

## 2021-10-12 ENCOUNTER — Ambulatory Visit (AMBULATORY_SURGERY_CENTER): Payer: Medicare HMO | Admitting: Internal Medicine

## 2021-10-12 ENCOUNTER — Encounter: Payer: Self-pay | Admitting: Internal Medicine

## 2021-10-12 VITALS — BP 140/86 | HR 87 | Temp 97.2°F | Resp 20 | Ht 66.0 in | Wt 195.0 lb

## 2021-10-12 DIAGNOSIS — E119 Type 2 diabetes mellitus without complications: Secondary | ICD-10-CM | POA: Diagnosis not present

## 2021-10-12 DIAGNOSIS — D122 Benign neoplasm of ascending colon: Secondary | ICD-10-CM

## 2021-10-12 DIAGNOSIS — K5900 Constipation, unspecified: Secondary | ICD-10-CM | POA: Diagnosis not present

## 2021-10-12 DIAGNOSIS — J45909 Unspecified asthma, uncomplicated: Secondary | ICD-10-CM | POA: Diagnosis not present

## 2021-10-12 DIAGNOSIS — R1084 Generalized abdominal pain: Secondary | ICD-10-CM | POA: Diagnosis not present

## 2021-10-12 DIAGNOSIS — D124 Benign neoplasm of descending colon: Secondary | ICD-10-CM

## 2021-10-12 DIAGNOSIS — K5904 Chronic idiopathic constipation: Secondary | ICD-10-CM | POA: Diagnosis not present

## 2021-10-12 MED ORDER — SODIUM CHLORIDE 0.9 % IV SOLN: 500.0000 mL | Freq: Once | INTRAVENOUS | Status: DC

## 2021-10-12 NOTE — Progress Notes (Signed)
VS taken by C.W. 

## 2021-10-12 NOTE — Op Note (Signed)
[place] Patient Name: Jane English Procedure Date: 10/12/2021 12:04 PM MRN: [id number] Endoscopist: Jerene Bears , MD Age: 50 Referring MD:  Date of Birth: [birth year] Gender: Female Account #: [id number] Procedure:                Colonoscopy Indications:              Generalized abdominal pain, Constipation,                            Obstipation Medicines:                Monitored Anesthesia Care, propofol 350 mg Procedure:                Pre-Anesthesia Assessment:                           - Prior to the procedure, a History and Physical                            was performed, and patient medications and                            allergies were reviewed. The patient's tolerance of                            previous anesthesia was also reviewed. The risks                            and benefits of the procedure and the sedation                            options and risks were discussed with the patient.                            All questions were answered, and informed consent                            was obtained. Prior Anticoagulants: The patient has                            taken no previous anticoagulant or antiplatelet                            agents. ASA Grade Assessment: III - A patient with                            severe systemic disease. After reviewing the risks                            and benefits, the patient was deemed in                            satisfactory condition to undergo the procedure.  After obtaining informed consent, the colonoscope                            was passed under direct vision. Throughout the                            procedure, the patient's blood pressure, pulse, and                            oxygen saturations were monitored continuously. The                            Olympus CF-HQ190L (Serial# [id number]) Colonoscope was                            introduced through the anus and advanced  to the                            terminal ileum. The colonoscopy was performed                            without difficulty. The patient tolerated the                            procedure well. The quality of the bowel                            preparation was excellent. The terminal ileum,                            ileocecal valve, appendiceal orifice, and rectum                            were photographed. Scope In: 12:24:27 PM Scope Out: 12:40:58 PM Scope Withdrawal Time: 0 hours 13 minutes 32 seconds  Total Procedure Duration: 0 hours 16 minutes 31 seconds  Findings:                 The digital rectal exam was normal.                           Two sessile polyps were found in the ascending                            colon. The polyps were 3 to 6 mm in size. These                            polyps were removed with a cold snare. Resection                            and retrieval were complete.                           A 3 mm polyp was found in the descending colon. The  polyp was sessile. The polyp was removed with a                            cold snare. Resection and retrieval were complete.                           The exam was otherwise without abnormality on                            direct and retroflexion views. Complications:            No immediate complications. Estimated Blood Loss:     Estimated blood loss was minimal. Impression:               - Two 3 to 6 mm polyps in the ascending colon,                            removed with a cold snare. Resected and retrieved.                           - One 3 mm polyp in the descending colon, removed                            with a cold snare. Resected and retrieved.                           - The examination was otherwise normal on direct                            and retroflexion views. No evidence of colitis or                            luminal narrowing. Recommendation:           -  Patient has a contact number available for                            emergencies. The signs and symptoms of potential                            delayed complications were discussed with the                            patient. Return to normal activities tomorrow.                            Written discharge instructions were provided to the                            patient.                           - Resume previous diet.                           -  Continue present medications.                           - Await pathology results.                           - Repeat colonoscopy is recommended. The                            colonoscopy date will be determined after pathology                            results from today's exam become available for                            review. Jerene Bears, MD 10/12/2021 12:43:53 PM This report has been signed electronically.

## 2021-10-12 NOTE — Progress Notes (Signed)
Patient presents for outpatient colonoscopy in the [facility] today. See office note dated 09/25/2021 for details. She remains appropriate for colonoscopy in the outpatient setting today.  The nature of the procedure, as well as the risks, benefits, and alternatives were carefully and thoroughly reviewed with the patient. Ample time for discussion and questions allowed. The patient understood, was satisfied, and agreed to proceed.

## 2021-10-12 NOTE — Progress Notes (Signed)
Called to room to assist during endoscopic procedure.  Patient ID and intended procedure confirmed with present staff. Received instructions for my participation in the procedure from the performing physician.  

## 2021-10-12 NOTE — Patient Instructions (Signed)
Handouts on polyps given to patient. Await pathology results. Resume previous diet and continue present medications. Repeat colonoscopy for screening purposes will be determined based off of pathology results.   YOU HAD AN ENDOSCOPIC PROCEDURE TODAY AT [facility]:   Refer to the procedure report that was given to you for any specific questions about what was found during the examination.  If the procedure report does not answer your questions, please call your gastroenterologist to clarify.  If you requested that your care partner not be given the details of your procedure findings, then the procedure report has been included in a sealed envelope for you to review at your convenience later.  YOU SHOULD EXPECT: Some feelings of bloating in the abdomen. Passage of more gas than usual.  Walking can help get rid of the air that was put into your GI tract during the procedure and reduce the bloating. If you had a lower endoscopy (such as a colonoscopy or flexible sigmoidoscopy) you may notice spotting of blood in your stool or on the toilet paper. If you underwent a bowel prep for your procedure, you may not have a normal bowel movement for a few days.  Please Note:  You might notice some irritation and congestion in your nose or some drainage.  This is from the oxygen used during your procedure.  There is no need for concern and it should clear up in a day or so.  SYMPTOMS TO REPORT IMMEDIATELY:  Following lower endoscopy (colonoscopy or flexible sigmoidoscopy):  Excessive amounts of blood in the stool  Significant tenderness or worsening of abdominal pains  Swelling of the abdomen that is new, acute  Fever of 100F or higher  For urgent or emergent issues, a gastroenterologist can be reached at any hour by calling [phone]. Do not use MyChart messaging for urgent concerns.    DIET:  We do recommend a small meal at first, but then you may proceed to your regular diet.   Drink plenty of fluids but you should avoid alcoholic beverages for 24 hours.  ACTIVITY:  You should plan to take it easy for the rest of today and you should NOT DRIVE or use heavy machinery until tomorrow (because of the sedation medicines used during the test).    FOLLOW UP: Our staff will call the number listed on your records 48-72 hours following your procedure to check on you and address any questions or concerns that you may have regarding the information given to you following your procedure. If we do not reach you, we will leave a message.  We will attempt to reach you two times.  During this call, we will ask if you have developed any symptoms of COVID 19. If you develop any symptoms (ie: fever, flu-like symptoms, shortness of breath, cough etc.) before then, please call [phone].  If you test positive for Covid 19 in the 2 weeks post procedure, please call and report this information to [place].    If any biopsies were taken you will be contacted by phone or by letter within the next 1-3 weeks.  Please call us at [phone] if you have not heard about the biopsies in 3 weeks.    SIGNATURES/CONFIDENTIALITY: You and/or your care partner have signed paperwork which will be entered into your electronic medical record.  These signatures attest to the fact that that the information above on your After Visit Summary has been reviewed and is understood.  Full responsibility of the  confidentiality of this discharge information lies with you and/or your care-partner.  

## 2021-10-12 NOTE — Progress Notes (Signed)
Report given to PACU, vss 

## 2021-10-16 ENCOUNTER — Telehealth: Payer: Self-pay | Admitting: *Deleted

## 2021-10-16 ENCOUNTER — Encounter: Payer: Self-pay | Admitting: Internal Medicine

## 2021-10-16 NOTE — Telephone Encounter (Signed)
  Follow up Call-  Call back number 10/12/2021  Post procedure Call Back phone  # [phone]  Permission to leave phone message Yes  Some recent data might be hidden     Patient questions:  Do you have a fever, pain , or abdominal swelling? No. Pain Score  0 *  Have you tolerated food without any problems? Yes.    Have you been able to return to your normal activities? Yes.    Do you have any questions about your discharge instructions: Diet   No. Medications  No. Follow up visit  No.  Do you have questions or concerns about your Care? No.  Actions: * If pain score is 4 or above: No action needed, pain <4.   Have you developed a fever since your procedure? no  2.   Have you had an respiratory symptoms (SOB or cough) since your procedure? no  3.   Have you tested positive for COVID 19 since your procedure no  4.   Have you had any family members/close contacts diagnosed with the COVID 19 since your procedure?  no   If yes to any of these questions please route to Joylene John, RN and Joella Prince, RN

## 2021-11-02 ENCOUNTER — Encounter: Payer: Medicare HMO | Admitting: Family Medicine

## 2021-11-07 ENCOUNTER — Encounter: Payer: Self-pay | Admitting: Registered Nurse

## 2021-11-07 ENCOUNTER — Other Ambulatory Visit: Payer: Self-pay

## 2021-11-07 ENCOUNTER — Encounter: Payer: Medicare HMO | Attending: Physical Medicine and Rehabilitation | Admitting: Registered Nurse

## 2021-11-07 VITALS — BP 105/68 | HR 99 | Temp 98.6°F | Ht 66.0 in | Wt 192.0 lb

## 2021-11-07 DIAGNOSIS — G894 Chronic pain syndrome: Secondary | ICD-10-CM | POA: Diagnosis not present

## 2021-11-07 DIAGNOSIS — M5416 Radiculopathy, lumbar region: Secondary | ICD-10-CM | POA: Diagnosis not present

## 2021-11-07 DIAGNOSIS — Z79891 Long term (current) use of opiate analgesic: Secondary | ICD-10-CM | POA: Insufficient documentation

## 2021-11-07 DIAGNOSIS — Z5181 Encounter for therapeutic drug level monitoring: Secondary | ICD-10-CM | POA: Diagnosis not present

## 2021-11-07 MED ORDER — OXYCODONE HCL 5 MG PO TABS: 10.0000 mg | ORAL_TABLET | Freq: Two times a day (BID) | ORAL | 0 refills | Status: DC

## 2021-11-07 NOTE — Progress Notes (Signed)
Subjective:    Patient ID: Jane English, female    DOB: [birth year], 51 y.o.   MRN: [id number]  HPI: Jane English is a 51 y.o. female who returns for follow up appointment for chronic pain and medication refill. She states her pain is located in her lower back radiating into her right hip and right lower extremity. She rates her pain 8. Her current exercise regime is walking and performing stretching exercises. Jane English states she will begin Physical therapy next week.     Jane English Morphine equivalent is 30.00 MME. She is also prescribed Alprazolam  by Dr. Moshe Cipro .We have discussed the black box warning of using opioids and benzodiazepines. I highlighted the dangers of using these drugs together and discussed the adverse events including respiratory suppression, overdose, cognitive impairment and importance of compliance with current regimen. We will continue to monitor and adjust as indicated.    UDS ordered today.   Pain Inventory Average Pain 9 Pain Right Now 8 My pain is constant, sharp, burning, dull, stabbing, tingling, and aching  In the last 24 hours, has pain interfered with the following? General activity 8 Relation with others 8 Enjoyment of life 8 What TIME of day is your pain at its worst? evening and night Sleep (in general) Fair  Pain is worse with: walking, bending, inactivity, standing, and some activites Pain improves with: rest, heat/ice, and medication Relief from Meds: 10  Family History  Problem Relation Age of Onset   Physical abuse Mother    Alcohol abuse Mother    Cirrhosis Mother    Lung cancer Father    Stomach cancer Father    Esophageal cancer Father    Alcohol abuse Father    Mental illness Father    Diabetes Sister    Hypertension Sister    Bipolar disorder Sister    Schizophrenia Sister    Diabetes Sister    Drug abuse Sister    HIV Sister    Pneumonia Sister        died as a baby   Alcohol abuse Brother    Hypertension Brother     Kidney disease Brother    Diabetes Brother    Drug abuse Brother    Mental illness Brother    Alcohol abuse Brother    Alcohol abuse Brother    Hypertension Brother    Diabetes Brother    Alcohol abuse Brother    Mental illness Brother        in [place]   Alcohol abuse Brother    Alcohol abuse Brother    Bipolar disorder Brother    Hypertension Brother    Bipolar disorder Brother    Drug abuse Brother    Alcohol abuse Brother    Bipolar disorder Brother    Bipolar disorder Daughter    Bipolar disorder Son    Bipolar disorder Son    ADD / ADHD Neg Hx    Anxiety disorder Neg Hx    Dementia Neg Hx    Depression Neg Hx    OCD Neg Hx    Seizures Neg Hx    Paranoid behavior Neg Hx    Colon cancer Neg Hx    Rectal cancer Neg Hx    Social History   Socioeconomic History   Marital status: Married    Spouse name: Not on file   Number of children: 4   Years of education: Not on file   Highest education level: Not on file  Occupational History   Not on file  Tobacco Use   Smoking status: Never   Smokeless tobacco: Never  Vaping Use   Vaping Use: Never used  Substance and Sexual Activity   Alcohol use: No   Drug use: No   Sexual activity: Not Currently    Partners: Male    Birth control/protection: Surgical    Comment: tubal  Other Topics Concern   Not on file  Social History Narrative   Not on file   Social Determinants of Health   Financial Resource Strain: Not on file  Food Insecurity: Not on file  Transportation Needs: Not on file  Physical Activity: Not on file  Stress: Not on file  Social Connections: Not on file   Past Surgical History:  Procedure Laterality Date   ANTERIOR CERVICAL DECOMP/DISCECTOMY FUSION  07/07/2012   Procedure: ANTERIOR CERVICAL DECOMPRESSION/DISCECTOMY FUSION 2 LEVELS;  Surgeon: Floyce Stakes, MD;  Location: [facility];  Service: Neurosurgery;  Laterality: N/A;  Cervical four-five, five - six  Anterior cervical  decompression/diskectomy/fusion/plate   APPENDECTOMY  1986   BOWEL RESECTION N/A 07/29/2013   Procedure: serosal repair;  Surgeon: Adin Hector, MD;  Location: [facility];  Service: General;  Laterality: N/A;   CARPAL TUNNEL RELEASE Bilateral    COLON SURGERY N/A    Phreesia 07/29/2020   LAPAROSCOPY N/A 07/29/2013   Procedure: diagnostic laporoscopy;  Surgeon: Adin Hector, MD;  Location: [facility];  Service: General;  Laterality: N/A;   LAPAROSCOPY N/A 08/16/2013   Procedure: LAPAROSCOPY DIAGNOSTIC/LYSIS OF ADHESIONS;  Surgeon: Adin Hector, MD;  Location: [facility];  Service: General;  Laterality: N/A;   LAPAROTOMY N/A 08/16/2013   Procedure: EXPLORATORY LAPAROTOMY/SMALL BOWEL RESECTION (JEJUNUM);  Surgeon: Adin Hector, MD;  Location: [facility];  Service: General;  Laterality: N/A;   LUMBAR SPINE SURGERY  2010   x 3   LYSIS OF ADHESION  2003   Dr. Irving Shows   LYSIS OF ADHESION N/A 07/29/2013   Procedure: LYSIS OF ADHESION;  Surgeon: Adin Hector, MD;  Location: [facility];  Service: General;  Laterality: N/A;   [place]?   Linna [place], [place]   SPINAL CORD STIMULATOR IMPLANT     SPINE SURGERY N/A    Phreesia 07/29/2020   TRIGGER FINGER RELEASE  2009   right pinkie finger   TUBAL LIGATION  1994   Past Surgical History:  Procedure Laterality Date   ANTERIOR CERVICAL DECOMP/DISCECTOMY FUSION  07/07/2012   Procedure: ANTERIOR CERVICAL DECOMPRESSION/DISCECTOMY FUSION 2 LEVELS;  Surgeon: Floyce Stakes, MD;  Location: [facility];  Service: Neurosurgery;  Laterality: N/A;  Cervical four-five, five - six  Anterior cervical decompression/diskectomy/fusion/plate   APPENDECTOMY  1986   BOWEL RESECTION N/A 07/29/2013   Procedure: serosal repair;  Surgeon: Adin Hector, MD;  Location: [facility];  Service: General;  Laterality: N/A;   CARPAL TUNNEL RELEASE Bilateral    COLON SURGERY N/A    Phreesia 07/29/2020   LAPAROSCOPY N/A 07/29/2013   Procedure:  diagnostic laporoscopy;  Surgeon: Adin Hector, MD;  Location: [facility];  Service: General;  Laterality: N/A;   LAPAROSCOPY N/A 08/16/2013   Procedure: LAPAROSCOPY DIAGNOSTIC/LYSIS OF ADHESIONS;  Surgeon: Adin Hector, MD;  Location: [facility];  Service: General;  Laterality: N/A;   LAPAROTOMY N/A 08/16/2013   Procedure: EXPLORATORY LAPAROTOMY/SMALL BOWEL RESECTION (JEJUNUM);  Surgeon: Adin Hector, MD;  [facility]: [facility];  Service: General;  Laterality: N/A;   LUMBAR SPINE SURGERY  2010   x 3   LYSIS OF ADHESION  2003   Dr. Irving Shows   LYSIS OF ADHESION N/A 07/29/2013   Procedure: LYSIS OF ADHESION;  Surgeon: Adin Hector, MD;  Location: [facility];  Service: General;  Laterality: N/A;   [place]?   Linna [place], [place]   SPINAL CORD STIMULATOR IMPLANT     SPINE SURGERY N/A    Phreesia 07/29/2020   TRIGGER FINGER RELEASE  2009   right pinkie finger   TUBAL LIGATION  1994   Past Medical History:  Diagnosis Date   Anemia    Asthma    Asthma flare 04/09/2013   Back pain    Bronchitis    Chronic abdominal pain    Chronic constipation    Constipation due to opioid therapy    Depression    Depression, major, single episode, severe ([place]) 10/03/2018   PHQ 9 score of 15   Diabetes mellitus without complication ([place])    Diabetes mellitus, type II ([place])    DVT (deep venous thrombosis) ([place]) 2010   GERD (gastroesophageal reflux disease)    Heart murmur    no cardiologist   Helicobacter pylori gastritis 06/11/2013   Colonoscopy Dr. Hilarie Fredrickson   Hypertension    IBS (irritable bowel syndrome)    Migraine headache    Neuropathy    Obesity    Obsessive-compulsive disorder    PSYCHOTIC D/O W/HALLUCINATIONS CONDS CLASS ELSW 03/04/2010   Qualifier: Diagnosis of  By: Moshe Cipro MD, Margaret     PTSD (post-traumatic stress disorder)    SBO (small bowel obstruction) ([place]) 08/09/2013   Seasonal allergies 12/10/2012   Seizures ([place])    Shortness of breath    There  were no vitals taken for this visit.  Opioid Risk Score:   Fall Risk Score:  `1  Depression screen PHQ 2/9  Depression screen [facility] 2/9 10/09/2021 09/11/2021 07/12/2021 06/11/2021 05/14/2021 05/08/2021 04/09/2021  Decreased Interest 0 0 0 0 1 0 0  Down, Depressed, Hopeless 0 0 0 0 0 0 0  PHQ - 2 Score 0 0 0 0 1 0 0  Altered sleeping - - - - 2 - -  Tired, decreased energy - - - - 1 - -  Change in appetite - - - - 2 - -  Feeling bad or failure about yourself  - - - - 1 - -  Trouble concentrating - - - - 2 - -  Moving slowly or fidgety/restless - - - - 1 - -  Suicidal thoughts - - - - 0 - -  PHQ-9 Score - - - - 10 - -  Difficult doing work/chores - - - - Somewhat difficult - -  Some encounter information is confidential and restricted. Go to Review Flowsheets activity to see all data.  Some recent data might be hidden    Review of Systems  Musculoskeletal:  Positive for back pain.       Pain in both knees, left foot & left hip  All other systems reviewed and are negative.     Objective:   Physical Exam Vitals and nursing note reviewed.  Constitutional:      Appearance: Normal appearance.  Cardiovascular:     Rate and Rhythm: Normal rate and regular rhythm.     Pulses: Normal pulses.     Heart sounds: Normal heart sounds.  Pulmonary:     Effort:  Pulmonary effort is normal.     Breath sounds: Normal breath sounds.  Musculoskeletal:     Cervical back: Normal range of motion and neck supple.     Comments: Normal Muscle Bulk and Muscle Testing Reveals:  Upper Extremities: Full ROM and Muscle Strength 5/5  Lumbar Paraspinal Tenderness: L-4-L-5 Mainly Left Side  Left Greater Trochanter Tenderness Lower Extremities: Full ROM and Muscle Strength 5/5 Arises from Table Slowly Antalgic  Gait     Skin:    General: Skin is warm and dry.  Neurological:     Mental Status: She is alert and oriented to person, place, and time.  Psychiatric:        Mood and Affect: Mood normal.         Behavior: Behavior normal.         Assessment & Plan:  1. Chronic Pain of Bilateral Shoulders L>R: Continue HEP as Tolerated. Continue to Monitor. 11/07/2021 2. Failed Back Syndrome of Lumbar Syndrome : Continue HEP as Tolerated. Continue current medication regimen. Continue to Monitor. 11/07/2021 3. Chronic Lower Back Pain without Sciatica: Continue HEP as Tolerated. Continue to Monitor.11/07/2021 4. Left Knee Pain: No Complaints today. Continue HEP as Tolerated. Continue to Monitor.11/07/2021 5. Muscle Spasm of Shoulders/ Upper Back : Continue Flexeril. Continue to Monitor.11/07/2021 6. Chronic Pain Syndrome: Refilled: Oxycodone 5mg /325 mg two tablets twice a day #120. We will continue the opioid monitoring program, this consists of regular clinic visits, examinations, urine drug screen, pill counts as well as use of [place] Controlled Substance Reporting system. A 12 month History has been reviewed on the [place] Controlled Substance Reporting System on 11/07/2021.   F/U in 1 month

## 2021-11-08 ENCOUNTER — Ambulatory Visit (INDEPENDENT_AMBULATORY_CARE_PROVIDER_SITE_OTHER): Payer: Medicare HMO

## 2021-11-08 DIAGNOSIS — Z0000 Encounter for general adult medical examination without abnormal findings: Secondary | ICD-10-CM | POA: Diagnosis not present

## 2021-11-08 NOTE — Progress Notes (Signed)
Subjective:   Jane English is a 51 y.o. female who presents for Medicare Annual (Subsequent) preventive examination. I connected with  [place] on 11/08/21 by a audio enabled telemedicine application and verified that I am speaking with the correct person using two identifiers.  Patient Location: Home  Provider Location: Home Office  I discussed the limitations of evaluation and management by telemedicine. The patient expressed understanding and agreed to proceed.  Review of Systems    Defer to PCP Cardiac Risk Factors include: hypertension     Objective:    Today's Vitals   11/08/21 0801  PainSc: 10-Worst pain ever   There is no height or weight on file to calculate BMI.  Advanced Directives 11/08/2021 11/01/2020 01/29/2020 11/15/2019 02/04/2019 08/13/2017 03/05/2017  Does Patient Have a Medical Advance Directive? _0  No No  Would patient like information on creating a medical advance directive? No - Patient declined No - Patient declined No - Patient declined No - Patient declined - No - Patient declined Yes (MAU/Ambulatory/Procedural Areas - Information given)  Pre-existing out of facility DNR order (yellow form or pink MOST form) - - - - - - -  Some encounter information is confidential and restricted. Go to Review Flowsheets activity to see all data.    Current Medications (verified) Outpatient Encounter Medications as of 11/08/2021  Medication Sig   albuterol (PROVENTIL) (2.5 MG/3ML) 0.083% nebulizer solution Take 3 mLs (2.5 mg total) by nebulization every 6 (six) hours as needed for wheezing or shortness of breath.   albuterol (VENTOLIN HFA) 108 (90 Base) MCG/ACT inhaler TAKE 2 PUFFS BY MOUTH EVERY 6 HOURS AS NEEDED FOR WHEEZE OR SHORTNESS OF BREATH   ALPRAZolam (XANAX) 0.25 MG tablet Take half tablet by mouth at bedtime for sleep and anxiety   amitriptyline (ELAVIL) 150 MG tablet TAKE HALF A TABLET (75 MG TOTAL) AT BEDTIME   beclomethasone (QVAR REDIHALER) 40  MCG/ACT inhaler Inhale 2 puffs into the lungs 2 (two) times daily.   BREO ELLIPTA 100-25 MCG/INH AEPB INHALE 1 PUFF INTO THE LUNGS TWICE A DAY   cloNIDine (CATAPRES) 0.1 MG tablet TAKE 1 TABLET BY MOUTH EVERYDAY AT BEDTIME   clotrimazole-betamethasone (LOTRISONE) cream APPLY TO AFFECTED AREA TWICE A DAY   cyclobenzaprine (FLEXERIL) 5 MG tablet Take 1 tablet (5 mg total) by mouth 3 (three) times daily as needed for muscle spasms.   diclofenac Sodium (VOLTAREN) 1 % GEL APPLY 2 GRAMS TO AFFECTED AREA 4 TIMES A DAY   DULoxetine (CYMBALTA) 20 MG capsule TAKE 1 CAPSULE BY MOUTH EVERY DAY   EPINEPHrine 0.3 mg/0.3 mL IJ SOAJ injection as needed.   ergocalciferol (VITAMIN D2) 1.25 MG (50000 UT) capsule Take 1 capsule (50,000 Units total) by mouth once a week. One capsule once weekly   fexofenadine-pseudoephedrine (ALLEGRA-D 24) 180-240 MG 24 hr tablet Take 1 tablet by mouth daily.   FLUoxetine (PROZAC) 40 MG capsule Take 1 capsule (40 mg total) by mouth daily.   fluticasone (FLONASE) 50 MCG/ACT nasal spray Place 1-2 sprays into both nostrils daily.   hydrOXYzine (ATARAX/VISTARIL) 10 MG tablet TAKE ONE TABLET BY MOUTH THREE TIMES DAILY FOR ANXIETY   ipratropium (ATROVENT) 0.03 % nasal spray Place 2 sprays into both nostrils every 12 (twelve) hours.   lidocaine (XYLOCAINE) 2 % jelly    linaclotide (LINZESS) 290 MCG CAPS capsule TAKE 1 CAPSULE (290 MCG TOTAL) BY MOUTH DAILY BEFORE BREAKFAST.   lubiprostone (AMITIZA) 24 MCG capsule TAKE 1 CAPSULE (  24 MCG TOTAL) BY MOUTH 2 (TWO) TIMES DAILY.   Magnesium Gluconate 250 MG TABS Take 1 tablet (250 mg total) by mouth at bedtime.   magnesium oxide (MAG-OX) 400 MG tablet TAKE 1 TABLET BY MOUTH EVERYDAY AT BEDTIME   meloxicam (MOBIC) 15 MG tablet Take 1 tablet (15 mg total) by mouth daily.   montelukast (SINGULAIR) 10 MG tablet TAKE 1 TABLET BY MOUTH EVERYDAY AT BEDTIME   NARCAN 4 MG/0.1ML LIQD nasal spray kit Place 4 mg into the nose as directed.   NONFORMULARY OR  COMPOUNDED ITEM Apply 1-2 pumps, 3-4 times daily as needed.   olopatadine (PATANOL) 0.1 % ophthalmic solution INSTILL 1 DROP INTO BOTH EYES TWICE A DAY   ondansetron (ZOFRAN) 4 MG tablet Take 1 tablet (4 mg total) by mouth every 8 (eight) hours as needed for nausea or vomiting.   oxyCODONE (ROXICODONE) 5 MG immediate release tablet Take 2 tablets (10 mg total) by mouth 2 (two) times daily.   pantoprazole (PROTONIX) 40 MG tablet Take 1 tablet (40 mg total) by mouth daily.   phentermine (ADIPEX-P) 37.5 MG tablet Take half tablet once daily by mouth at breakfast   solifenacin (VESICARE) 5 MG tablet Take 1 tablet (5 mg total) by mouth daily.   Tiotropium Bromide Monohydrate (SPIRIVA RESPIMAT) 1.25 MCG/ACT AERS Inhale 2 puffs into the lungs daily.   triamterene-hydrochlorothiazide (MAXZIDE-25) 37.5-25 MG tablet TAKE 1 TABLET BY MOUTH EVERY DAY   trolamine salicylate (ASPERCREME) 10 % cream Apply 1 application topically as needed for muscle pain.    SHINGRIX injection    [DISCONTINUED] benzonatate (TESSALON) 100 MG capsule Take 1 capsule (100 mg total) by mouth 2 (two) times daily as needed for cough. (Patient not taking: Reported on 11/08/2021)   Facility-Administered Encounter Medications as of 11/08/2021  Medication   Mepolizumab SOLR 100 mg    Allergies (verified) Neurontin [gabapentin], Penicillins, Pregabalin, Tramadol, and Latex   History: Past Medical History:  Diagnosis Date   Anemia    Asthma    Asthma flare 04/09/2013   Back pain    Bronchitis    Chronic abdominal pain    Chronic constipation    Constipation due to opioid therapy    Depression    Depression, major, single episode, severe ([place]) 10/03/2018   PHQ 9 score of 15   Diabetes mellitus without complication ([place])    Diabetes mellitus, type II ([place])    DVT (deep venous thrombosis) ([place]) 2010   GERD (gastroesophageal reflux disease)    Heart murmur    no cardiologist   Helicobacter pylori gastritis 06/11/2013   Colonoscopy  Dr. Hilarie Fredrickson   Hypertension    IBS (irritable bowel syndrome)    Migraine headache    Neuropathy    Obesity    Obsessive-compulsive disorder    PSYCHOTIC D/O W/HALLUCINATIONS CONDS CLASS ELSW 03/04/2010   Qualifier: Diagnosis of  By: Moshe Cipro MD, Margaret     PTSD (post-traumatic stress disorder)    SBO (small bowel obstruction) ([place]) 08/09/2013   Seasonal allergies 12/10/2012   Seizures ([place])    Shortness of breath    Past Surgical History:  Procedure Laterality Date   ANTERIOR CERVICAL DECOMP/DISCECTOMY FUSION  07/07/2012   Procedure: ANTERIOR CERVICAL DECOMPRESSION/DISCECTOMY FUSION 2 LEVELS;  Surgeon: Floyce Stakes, MD;  Location: [facility];  Service: Neurosurgery;  Laterality: N/A;  Cervical four-five, five - six  Anterior cervical decompression/diskectomy/fusion/plate   APPENDECTOMY  1986   BOWEL RESECTION N/A 07/29/2013   Procedure: serosal repair;  Surgeon: Adin Hector, MD;  Location: [facility];  Service: General;  Laterality: N/A;   CARPAL TUNNEL RELEASE Bilateral    COLON SURGERY N/A    [employer]rees[employer] 07/29/2020   LAPAROSCOPY N/A 07/29/2013   Procedure: diagnostic laporoscopy;  Surgeon: Adin Hector, MD;  Location: [facility];  Service: General;  Laterality: N/A;   LAPAROSCOPY N/A 08/16/2013   Procedure: LAPAROSCOPY DIAGNOSTIC/LYSIS OF ADHESIONS;  Surgeon: Adin Hector, MD;  Location: [facility];  Service: General;  Laterality: N/A;   LAPAROTOMY N/A 08/16/2013   Procedure: EXPLORATORY LAPAROTOMY/SMALL BOWEL RESECTION (JEJUNUM);  Surgeon: Adin Hector, MD;  Location: [facility];  Service: General;  Laterality: N/A;   LUMBAR SPINE SURGERY  2010   x 3   LYSIS OF ADHESION  2003   Dr. Irving Shows   LYSIS OF ADHESION N/A 07/29/2013   Procedure: LYSIS OF ADHESION;  Surgeon: Adin Hector, MD;  Location: [facility];  Service: General;  Laterality: N/A;   [place]?   Linna Hoff, [place]   SPINAL CORD STIMULATOR IMPLANT     SPINE SURGERY N/A    [employer]reesia  07/29/2020   TRIGGER FINGER RELEASE  2009   right pinkie finger   TUBAL LIGATION  1994   Family History  Problem Relation Age of Onset   Physical abuse Mother    Alcohol abuse Mother    Cirrhosis Mother    Lung cancer Father    Stomach cancer Father    Esophageal cancer Father    Alcohol abuse Father    Mental illness Father    Diabetes Sister    Hypertension Sister    Bipolar disorder Sister    Schizophrenia Sister    Diabetes Sister    Drug abuse Sister    HIV Sister    Pneumonia Sister        died as a baby   Alcohol abuse Brother    Hypertension Brother    Kidney disease Brother    Diabetes Brother    Drug abuse Brother    Mental illness Brother    Alcohol abuse Brother    Alcohol abuse Brother    Hypertension Brother    Diabetes Brother    Alcohol abuse Brother    Mental illness Brother        in [place]   Alcohol abuse Brother    Alcohol abuse Brother    Bipolar disorder Brother    Hypertension Brother    Bipolar disorder Brother    Drug abuse Brother    Alcohol abuse Brother    Bipolar disorder Brother    Bipolar disorder Daughter    Bipolar disorder Son    Bipolar disorder Son    ADD / ADHD Neg Hx    Anxiety disorder Neg Hx    Dementia Neg Hx    Depression Neg Hx    OCD Neg Hx    Seizures Neg Hx    Paranoid behavior Neg Hx    Colon cancer Neg Hx    Rectal cancer Neg Hx    Social History   Socioeconomic History   Marital status: Married    Spouse name: Herbie [place]   Number of children: 4   Years of education: 12   Highest education level: Some college, no degree  Occupational History   Occupation: disabled  Tobacco Use   Smoking status: Never   Smokeless tobacco: Never  Vaping Use   Vaping Use: Never used  Substance  and Sexual Activity   Alcohol use: No   Drug use: No   Sexual activity: Not Currently    Partners: Male    Birth control/protection: Surgical    Comment: tubal  Other Topics Concern   Not on file  Social History Narrative    Not on file   Social Determinants of Health   Financial Resource Strain: Low Risk    Difficulty of Paying Living Expenses: Not hard at all  Food Insecurity: No Food Insecurity   Worried About Charity fundraiser in the Last Year: Never true   Ran Out of Food in the Last Year: Never true  Transportation Needs: No Transportation Needs   Lack of Transportation (Medical): No   Lack of Transportation (Non-Medical): No  Physical Activity: Insufficiently Active   Days of Exercise per Week: 3 days   Minutes of Exercise per Session: 30 min  Stress: No Stress Concern Present   Feeling of Stress : Only a little  Social Connections: Moderately Isolated   Frequency of Communication with Friends and Family: More than three times a week   Frequency of Social Gatherings with Friends and Family: Once a week   Attends Religious Services: Never   Marine scientist or Organizations: No   Attends Music therapist: Never   Marital Status: Married    Tobacco Counseling Counseling given: Not Answered   Clinical Intake:  Pre-visit preparation completed: No  Pain : 0-10 Pain Score: 10-Worst pain ever Pain Type: Chronic pain Pain Location: Back Pain Orientation: Lower Pain Radiating Towards: feet Pain Descriptors / Indicators: Sharp, Aching, Burning Pain Onset: More than a month ago Pain Frequency: Constant Pain Relieving Factors: Pt sees pain management  Pain Relieving Factors: Pt sees pain management  Nutritional Risks: None Diabetes: No  How often do you need to have someone help you when you read instructions, pamphlets, or other written materials from your doctor or pharmacy?: 1 - Never What is the last grade level you completed in school?: 12th  Diabetic?no  Interpreter Needed?: No  Information entered by :: [place] of Daily Living In your present state of health, do you have any difficulty performing the following activities: 11/08/2021  Hearing?  N  Vision? Y  Comment Pt states something is going on and she thinks it is stress  Difficulty concentrating or making decisions? N  Walking or climbing stairs? N  Dressing or bathing? N  Doing errands, shopping? N  Preparing Food and eating ? N  Using the Toilet? N  In the past six months, have you accidently leaked urine? Y  Do you have problems with loss of bowel control? N  Managing your Medications? N  Managing your Finances? N  Housekeeping or managing your Housekeeping? N  Some recent data might be hidden    Patient Care Team: Fayrene Helper, MD as PCP - General Pyrtle, Lajuan Lines, MD as Consulting Physician (Gastroenterology) Cloria Spring, MD as Consulting Physician ([place]) [place], Willey Blade, PA-C as Physician Assistant (Physician Assistant)  Indicate any recent Medical Services you may have received from other than [facility] providers in the past year (date may be approximate).     Assessment:   This is a routine wellness examination for [place].  Hearing/Vision screen No results found.  Dietary issues and exercise activities discussed: Current Exercise Habits: Home exercise routine, Type of exercise: walking, Time (Minutes): 30, Frequency (Times/Week): 3, Weekly Exercise (Minutes/Week): 90, Intensity: Mild (Pt has  a bad back), Exercise limited by: orthopedic condition(s)   Goals Addressed             This Visit's Progress    Exercise 3x per week (30 min per time)   On track    Recommend starting a routine exercise program at least 3 days a week for 30-45 minutes at a time as tolerated.        Depression Screen PHQ 2/9 Scores 11/08/2021 11/07/2021 10/09/2021 09/11/2021 07/12/2021 06/11/2021 05/14/2021  PHQ - 2 Score 0 0 0 0 0 0 1  PHQ- 9 Score - - - - - - 10  Exception Documentation - - - - - - -  Some encounter information is confidential and restricted. Go to Review Flowsheets activity to see all data.    Fall Risk Fall Risk  11/08/2021 11/07/2021 10/09/2021  09/11/2021 08/09/2021  Falls in the past year? 1 0 0 0 0  Comment - - - - -  Number falls in past yr: 1 0 0 0 -  Comment - - - - -  Injury with Fall? 0 0 - 0 -  Comment - - - - -  Risk for fall due to : History of fall(s);Medication side effect - - - -  Follow up Falls evaluation completed - - - -    FALL RISK PREVENTION PERTAINING TO THE HOME:  Any stairs in or around the home? Yes  If so, are there any without handrails? Yes  Home free of loose throw rugs in walkways, pet beds, electrical cords, etc? Yes  Adequate lighting in your home to reduce risk of falls? Yes   ASSISTIVE DEVICES UTILIZED TO PREVENT FALLS:  Life alert? No  Use of a cane, walker or w/c? Yes  Grab bars in the bathroom? Yes  Shower chair or bench in shower? No  Elevated toilet seat or a handicapped toilet? No       Cognitive Function:     6CIT Screen 11/08/2021 11/01/2020 03/05/2017  What Year? 0 points 0 points 0 points  What month? 0 points 0 points 0 points  What time? 0 points 0 points 0 points  Count back from 20 0 points 0 points 0 points  Months in reverse 4 points 0 points 0 points  Repeat phrase 2 points 2 points 0 points  Total Score 6 2 0    Immunizations Immunization History  Administered Date(s) Administered   Influenza Split 09/03/2012   Influenza Whole 07/28/2007, 08/11/2009, 07/19/2010   Influenza,inj,Quad PF,6+ Mos 09/13/2013, 08/12/2014, 08/01/2015, 07/24/2016, 08/14/2017, 08/12/2018, 08/10/2019, 07/11/2020   Influenza-Unspecified 05/24/2018   Moderna Sars-Covid-2 Vaccination 07/21/2020, 08/14/2020, 03/12/2021   PPD Test 02/22/2011, 06/26/2011, 09/18/2012, 11/29/2013, 12/06/2014, 12/09/2017   Pneumococcal Polysaccharide-23 05/14/2021   Td 10/03/2005   Tdap 12/09/2017    TDAP status: Up to date  Flu Vaccine status: Due, Education has been provided regarding the importance of this vaccine. Advised may receive this vaccine at local pharmacy or Health Dept. Aware to provide a copy  of the vaccination record if obtained from local pharmacy or Health Dept. Verbalized acceptance and understanding.  Pneumococcal vaccine status: Due, Education has been provided regarding the importance of this vaccine. Advised may receive this vaccine at local pharmacy or Health Dept. Aware to provide a copy of the vaccination record if obtained from local pharmacy or Health Dept. Verbalized acceptance and understanding.  Covid-19 vaccine status: Information provided on how to obtain vaccines.   Qualifies for Shingles Vaccine? Yes   Zostavax  completed No   Shingrix Completed?: No.    Education has been provided regarding the importance of this vaccine. Patient has been advised to call insurance company to determine out of pocket expense if they have not yet received this vaccine. Advised may also receive vaccine at local pharmacy or Health Dept. Verbalized acceptance and understanding.  Screening Tests Health Maintenance  Topic Date Due   COVID-19 Vaccine (4 - Booster for Moderna series) 05/07/2021   INFLUENZA VACCINE  06/04/2021   Zoster Vaccines- Shingrix (2 of 2) 08/22/2021   HEMOGLOBIN A1C  11/14/2021   Pneumococcal Vaccine 61-42 Years old (2 - PCV) 05/14/2022   MAMMOGRAM  01/23/2023   PAP SMEAR-Modifier  08/11/2023   COLONOSCOPY (Pts 45-77yr Insurance coverage will need to be confirmed)  10/12/2026   TETANUS/TDAP  12/10/2027   Hepatitis C Screening  Completed   HIV Screening  Completed   HPV VACCINES  Aged Out    Health Maintenance  Health Maintenance Due  Topic Date Due   COVID-19 Vaccine (4 - Booster for Moderna series) 05/07/2021   INFLUENZA VACCINE  06/04/2021   Zoster Vaccines- Shingrix (2 of 2) 08/22/2021    Colorectal cancer screening: Type of screening: Colonoscopy. Completed 10/12/2021. Repeat every 10 years  Mammogram status: Completed 01/22/2021. Repeat every year  Bone Density status: Ordered Pt not 63 yet. Pt provided with contact info and advised to call  to schedule appt.  Lung Cancer Screening: (Low Dose CT Chest recommended if Age 53-80years, 30 pack-year currently smoking OR have quit w/in 15years.) does not qualify.   Lung Cancer Screening Referral: n/a  Additional Screening:  Hepatitis C Screening: does qualify; Completed 05/14/2021  Vision Screening: Recommended annual ophthalmology exams for early detection of glaucoma and other disorders of the eye. Is the patient up to date with their annual eye exam?  Yes  Who is the provider or what is the name of the office in which the patient attends annual eye exams? [facility] [place] If pt is not established with a provider, would they like to be referred to a provider to establish care? No .   Dental Screening: Recommended annual dental exams for proper oral hygiene  Community Resource Referral / Chronic Care Management: CRR required this visit?  No   CCM required this visit?  No      Plan:     I have personally reviewed and noted the following in the patients chart:   Medical and social history Use of alcohol, tobacco or illicit drugs  Current medications and supplements including opioid prescriptions.  Functional ability and status Nutritional status Physical activity Advanced directives List of other physicians Hospitalizations, surgeries, and ER visits in previous 12 months Vitals Screenings to include cognitive, depression, and falls Referrals and appointments  In addition, I have reviewed and discussed with patient certain preventive protocols, quality metrics, and best practice recommendations. A written personalized care plan for preventive services as well as general preventive health recommendations were provided to patient.     SEarline Mayotte C[place]  11/08/2021   Nurse Notes:  Ms. PAveni, Thank you for taking time to come for your Medicare Wellness Visit. I appreciate your ongoing commitment to your health goals. Please review the following plan  we discussed and let me know if I can assist you in the future.   These are the goals we discussed:  Goals      Exercise 3x per week (30 min per time)  Recommend starting a routine exercise program at least 3 days a week for 30-45 minutes at a time as tolerated.       Protect My Health        This is a list of the screening recommended for you and due dates:  Health Maintenance  Topic Date Due   COVID-19 Vaccine (4 - Booster for Moderna series) 05/07/2021   Flu Shot  06/04/2021   Zoster (Shingles) Vaccine (2 of 2) 08/22/2021   Hemoglobin A1C  11/14/2021   Pneumococcal Vaccination (2 - PCV) 05/14/2022   Mammogram  01/23/2023   Pap Smear  08/11/2023   Colon Cancer Screening  10/12/2026   Tetanus Vaccine  12/10/2027   Hepatitis C Screening: USPSTF Recommendation to screen - Ages 18-79 yo.  Completed   HIV Screening  Completed   HPV Vaccine  Aged Out

## 2021-11-08 NOTE — Patient Instructions (Addendum)
Ms. Jane English , Thank you for taking time to come for your Medicare Wellness Visit. I appreciate your ongoing commitment to your health goals. Please review the following plan we discussed and let me know if I can assist you in the future.   These are the goals we discussed:  Goals      Exercise 3x per week (30 min per time)     Recommend starting a routine exercise program at least 3 days a week for 30-45 minutes at a time as tolerated.       Protect My Health        This is a list of the screening recommended for you and due dates:  Health Maintenance  Topic Date Due   COVID-19 Vaccine (4 - Booster for Moderna series) 05/07/2021   Flu Shot  06/04/2021   Zoster (Shingles) Vaccine (2 of 2) 08/22/2021   Hemoglobin A1C  11/14/2021   Pneumococcal Vaccination (2 - PCV) 05/14/2022   Mammogram  01/23/2023   Pap Smear  08/11/2023   Colon Cancer Screening  10/12/2026   Tetanus Vaccine  12/10/2027   Hepatitis C Screening: USPSTF Recommendation to screen - Ages 18-79 yo.  Completed   HIV Screening  Completed   HPV Vaccine  Aged Out    Health Maintenance, Female Adopting a healthy lifestyle and getting preventive care are important in promoting health and wellness. Ask your health care provider about: The right schedule for you to have regular tests and exams. Things you can do on your own to prevent diseases and keep yourself healthy. What should I know about diet, weight, and exercise? Eat a healthy diet  Eat a diet that includes plenty of vegetables, fruits, low-fat dairy products, and lean protein. Do not eat a lot of foods that are high in solid fats, added sugars, or sodium. Maintain a healthy weight Body mass index (BMI) is used to identify weight problems. It estimates body fat based on height and weight. Your health care provider can help determine your BMI and help you achieve or maintain a healthy weight. Get regular exercise Get regular exercise. This is one of the most  important things you can do for your health. Most adults should: Exercise for at least 150 minutes each week. The exercise should increase your heart rate and make you sweat (moderate-intensity exercise). Do strengthening exercises at least twice a week. This is in addition to the moderate-intensity exercise. Spend less time sitting. Even light physical activity can be beneficial. Watch cholesterol and blood lipids Have your blood tested for lipids and cholesterol at 52 years of age, then have this test every 5 years. Have your cholesterol levels checked more often if: Your lipid or cholesterol levels are high. You are older than 53 years of age. You are at high risk for heart disease. What should I know about cancer screening? Depending on your health history and family history, you may need to have cancer screening at various ages. This may include screening for: Breast cancer. Cervical cancer. Colorectal cancer. Skin cancer. Lung cancer. What should I know about heart disease, diabetes, and high blood pressure? Blood pressure and heart disease High blood pressure causes heart disease and increases the risk of stroke. This is more likely to develop in people who have high blood pressure readings or are overweight. Have your blood pressure checked: Every 3-5 years if you are 59-97 years of age. Every year if you are 39 years old or older. Diabetes Have regular diabetes  screenings. This checks your fasting blood sugar level. Have the screening done: Once every three years after age 39 if you are at a normal weight and have a low risk for diabetes. More often and at a younger age if you are overweight or have a high risk for diabetes. What should I know about preventing infection? Hepatitis B If you have a higher risk for hepatitis B, you should be screened for this virus. Talk with your health care provider to find out if you are at risk for hepatitis B infection. Hepatitis C Testing  is recommended for: Everyone born from [birth year] through 1967. Anyone with known risk factors for hepatitis C. Sexually transmitted infections (STIs) Get screened for STIs, including gonorrhea and chlamydia, if: You are sexually active and are younger than 53 years of age. You are older than 53 years of age and your health care provider tells you that you are at risk for this type of infection. Your sexual activity has changed since you were last screened, and you are at increased risk for chlamydia or gonorrhea. Ask your health care provider if you are at risk. Ask your health care provider about whether you are at high risk for HIV. Your health care provider may recommend a prescription medicine to help prevent HIV infection. If you choose to take medicine to prevent HIV, you should first get tested for HIV. You should then be tested every 3 months for as long as you are taking the medicine. Pregnancy If you are about to stop having your period (premenopausal) and you may become pregnant, seek counseling before you get pregnant. Take 400 to 800 micrograms (mcg) of folic acid every day if you become pregnant. Ask for birth control (contraception) if you want to prevent pregnancy. Osteoporosis and menopause Osteoporosis is a disease in which the bones lose minerals and strength with aging. This can result in bone fractures. If you are 1 years old or older, or if you are at risk for osteoporosis and fractures, ask your health care provider if you should: Be screened for bone loss. Take a calcium or vitamin D supplement to lower your risk of fractures. Be given hormone replacement therapy (HRT) to treat symptoms of menopause. Follow these instructions at home: Alcohol use Do not drink alcohol if: Your health care provider tells you not to drink. You are pregnant, may be pregnant, or are planning to become pregnant. If you drink alcohol: Limit how much you have to: 0-1 drink a day. Know how much  alcohol is in your drink. In the [place] one drink equals one 12 oz bottle of beer (355 mL), one 5 oz glass of wine (148 mL), or one 1 oz glass of hard liquor (44 mL). Lifestyle Do not use any products that contain nicotine or tobacco. These products include cigarettes, chewing tobacco, and vaping devices, such as e-cigarettes. If you need help quitting, ask your health care provider. Do not use street drugs. Do not share needles. Ask your health care provider for help if you need support or information about quitting drugs. General instructions Schedule regular health, dental, and eye exams. Stay current with your vaccines. Tell your health care provider if: You often feel depressed. You have ever been abused or do not feel safe at home. Summary Adopting a healthy lifestyle and getting preventive care are important in promoting health and wellness. Follow your health care provider's instructions about healthy diet, exercising, and getting tested or screened for diseases. Follow your health  care provider's instructions on monitoring your cholesterol and blood pressure. This information is not intended to replace advice given to you by your health care provider. Make sure you discuss any questions you have with your health care provider. Document Revised: 03/12/2021 Document Reviewed: 03/12/2021 Elsevier Patient Education  [place].

## 2021-11-11 LAB — TOXASSURE SELECT,+ANTIDEPR,UR

## 2021-11-13 ENCOUNTER — Other Ambulatory Visit: Payer: Self-pay

## 2021-11-13 ENCOUNTER — Encounter (HOSPITAL_BASED_OUTPATIENT_CLINIC_OR_DEPARTMENT_OTHER): Payer: Self-pay | Admitting: Physical Therapy

## 2021-11-13 ENCOUNTER — Ambulatory Visit (HOSPITAL_BASED_OUTPATIENT_CLINIC_OR_DEPARTMENT_OTHER): Payer: Medicare HMO | Attending: Physical Medicine and Rehabilitation | Admitting: Physical Therapy

## 2021-11-13 DIAGNOSIS — R262 Difficulty in walking, not elsewhere classified: Secondary | ICD-10-CM | POA: Insufficient documentation

## 2021-11-13 DIAGNOSIS — M5416 Radiculopathy, lumbar region: Secondary | ICD-10-CM | POA: Diagnosis not present

## 2021-11-13 DIAGNOSIS — M545 Low back pain, unspecified: Secondary | ICD-10-CM | POA: Insufficient documentation

## 2021-11-13 DIAGNOSIS — M6281 Muscle weakness (generalized): Secondary | ICD-10-CM | POA: Insufficient documentation
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
## 2021-11-16 ENCOUNTER — Other Ambulatory Visit: Payer: Self-pay | Admitting: Internal Medicine

## 2021-11-16 ENCOUNTER — Other Ambulatory Visit: Payer: Self-pay | Admitting: Family Medicine

## 2021-11-16 ENCOUNTER — Telehealth: Payer: Self-pay | Admitting: *Deleted

## 2021-11-16 NOTE — Telephone Encounter (Signed)
Urine drug screen for this encounter is consistent for prescribed medication 

## 2021-11-20 ENCOUNTER — Telehealth (HOSPITAL_BASED_OUTPATIENT_CLINIC_OR_DEPARTMENT_OTHER): Payer: Self-pay | Admitting: Physical Therapy

## 2021-11-20 ENCOUNTER — Ambulatory Visit (HOSPITAL_BASED_OUTPATIENT_CLINIC_OR_DEPARTMENT_OTHER): Payer: Medicare HMO | Admitting: Physical Therapy

## 2021-11-20 NOTE — Telephone Encounter (Signed)
Called patient regarding missed visit. Patient did not answer. Therapy left message reminding patient of her next visit.

## 2021-11-21 ENCOUNTER — Other Ambulatory Visit: Payer: Self-pay | Admitting: Internal Medicine

## 2021-11-26 ENCOUNTER — Other Ambulatory Visit: Payer: Self-pay | Admitting: Family Medicine

## 2021-11-27 ENCOUNTER — Other Ambulatory Visit: Payer: Self-pay

## 2021-11-27 ENCOUNTER — Ambulatory Visit (HOSPITAL_BASED_OUTPATIENT_CLINIC_OR_DEPARTMENT_OTHER): Payer: Medicare HMO | Admitting: Physical Therapy

## 2021-11-27 ENCOUNTER — Telehealth: Payer: Self-pay | Admitting: Physical Medicine and Rehabilitation

## 2021-11-27 ENCOUNTER — Encounter (HOSPITAL_BASED_OUTPATIENT_CLINIC_OR_DEPARTMENT_OTHER): Payer: Self-pay | Admitting: Physical Therapy

## 2021-11-27 DIAGNOSIS — R262 Difficulty in walking, not elsewhere classified: Secondary | ICD-10-CM

## 2021-11-27 DIAGNOSIS — M5416 Radiculopathy, lumbar region: Secondary | ICD-10-CM | POA: Diagnosis not present

## 2021-11-27 DIAGNOSIS — M545 Low back pain, unspecified: Secondary | ICD-10-CM

## 2021-11-27 DIAGNOSIS — M6281 Muscle weakness (generalized): Secondary | ICD-10-CM

## 2021-11-27 NOTE — Telephone Encounter (Signed)
Before I move mis [place] for her August apt want to clarify if it is for a follow up or Trigger points if Follow up do we change to [place]?  Please advise.  Thanks.

## 2021-11-27 NOTE — Telephone Encounter (Signed)
Please advise if we are seeing Ms Tomasini for Trigger points or follow up in August.  If just for Follow up is that something [name] can follow up with her on?
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
## 2021-11-30 ENCOUNTER — Telehealth (INDEPENDENT_AMBULATORY_CARE_PROVIDER_SITE_OTHER): Payer: Medicare HMO | Admitting: Family Medicine

## 2021-11-30 ENCOUNTER — Other Ambulatory Visit: Payer: Self-pay

## 2021-11-30 ENCOUNTER — Encounter: Payer: Self-pay | Admitting: Family Medicine

## 2021-11-30 ENCOUNTER — Other Ambulatory Visit (HOSPITAL_COMMUNITY): Payer: Self-pay | Admitting: Family Medicine

## 2021-11-30 DIAGNOSIS — J209 Acute bronchitis, unspecified: Secondary | ICD-10-CM

## 2021-11-30 DIAGNOSIS — J4541 Moderate persistent asthma with (acute) exacerbation: Secondary | ICD-10-CM

## 2021-11-30 DIAGNOSIS — Z1231 Encounter for screening mammogram for malignant neoplasm of breast: Secondary | ICD-10-CM

## 2021-11-30 MED ORDER — PREDNISONE 10 MG PO TABS: 10.0000 mg | ORAL_TABLET | Freq: Two times a day (BID) | ORAL | 0 refills | Status: DC

## 2021-11-30 MED ORDER — AZITHROMYCIN 250 MG PO TABS: ORAL_TABLET | ORAL | 0 refills | Status: AC

## 2021-11-30 MED ORDER — PROMETHAZINE-DM 6.25-15 MG/5ML PO SYRP: ORAL_SOLUTION | ORAL | 0 refills | Status: DC

## 2021-11-30 MED ORDER — BENZONATATE 100 MG PO CAPS: 100.0000 mg | ORAL_CAPSULE | Freq: Two times a day (BID) | ORAL | 0 refills | Status: DC | PRN

## 2021-11-30 MED ORDER — OXYBUTYNIN CHLORIDE ER 10 MG PO TB24: 10.0000 mg | ORAL_TABLET | Freq: Every day | ORAL | 2 refills | Status: DC

## 2021-11-30 NOTE — Patient Instructions (Addendum)
Annual physical exam in 3 months, call if you needme sooner  Medications are sent fort acute bronchitis and asthma flare  Nurse please refilll duoneb solutions  Nurse please provide work excuse for Friday to return on Monday 12/03/2021 ( coming to collect it)  Needs flu vaccine in 2 to 3 weeks  Needs mammogram scheduled for March  Thanks for choosing [facility], we consider it a privelige to serve you.

## 2021-11-30 NOTE — Progress Notes (Signed)
.   Virtual Visit via Telephone Note  I connected with [place] on 11/30/21 at  9:40 AM EST by telephone and verified that I am speaking with the correct person using two identifiers.  Location: Patient: home Provider: office   I discussed the limitations, risks, security and privacy concerns of performing an evaluation and management service by telephone and the availability of in person appointments. I also discussed with the patient that there may be a patient responsible charge related to this service. The patient expressed understanding and agreed to proceed.   History of Present Illness:   2 day h/o excessive cough, yellow sputum, chills yesterday, no sinus symptoms, needs neb solution Observations/Objective: There were no vitals taken for this visit. Good communication with no confusion and intact memory. Alert and oriented x 3 Excessive deep rattling cough during speech   Assessment and Plan: Not well controlled severe persistent asthma Current flare, short course of prednisone, and duoneb solution prescribed  Acute bronchitis Antibiotic and decongestant prescribed, Z pack, tessalon perles, phenergan DM   Follow Up Instructions:    I discussed the assessment and treatment plan with the patient. The patient was provided an opportunity to ask questions and all were answered. The patient agreed with the plan and demonstrated an understanding of the instructions.   The patient was advised to call back or seek an in-person evaluation if the symptoms worsen or if the condition fails to improve as anticipated.  I provided 14 minutes of non-face-to-face time during this encounter.   Jane Nakayama, MD

## 2021-12-01 ENCOUNTER — Encounter: Payer: Self-pay | Admitting: Family Medicine

## 2021-12-01 NOTE — Assessment & Plan Note (Addendum)
Antibiotic and decongestant prescribed, Z pack, tessalon perles, phenergan DM

## 2021-12-01 NOTE — Assessment & Plan Note (Signed)
Current flare, short course of prednisone, and duoneb solution prescribed

## 2021-12-04 ENCOUNTER — Ambulatory Visit (HOSPITAL_BASED_OUTPATIENT_CLINIC_OR_DEPARTMENT_OTHER): Payer: Medicare HMO | Admitting: Physical Therapy

## 2021-12-05 ENCOUNTER — Telehealth: Payer: Medicare HMO | Admitting: Family Medicine

## 2021-12-06 ENCOUNTER — Encounter: Payer: Self-pay | Admitting: Registered Nurse

## 2021-12-06 ENCOUNTER — Other Ambulatory Visit: Payer: Self-pay

## 2021-12-06 ENCOUNTER — Encounter: Payer: Medicare HMO | Attending: Physical Medicine and Rehabilitation | Admitting: Registered Nurse

## 2021-12-06 VITALS — BP 132/84 | HR 86 | Temp 99.1°F | Ht 66.0 in | Wt 194.0 lb

## 2021-12-06 DIAGNOSIS — Z5181 Encounter for therapeutic drug level monitoring: Secondary | ICD-10-CM

## 2021-12-06 DIAGNOSIS — G894 Chronic pain syndrome: Secondary | ICD-10-CM

## 2021-12-06 DIAGNOSIS — M961 Postlaminectomy syndrome, not elsewhere classified: Secondary | ICD-10-CM | POA: Diagnosis not present

## 2021-12-06 DIAGNOSIS — Z79891 Long term (current) use of opiate analgesic: Secondary | ICD-10-CM | POA: Diagnosis not present

## 2021-12-06 DIAGNOSIS — M5416 Radiculopathy, lumbar region: Secondary | ICD-10-CM

## 2021-12-06 MED ORDER — OXYCODONE HCL 5 MG PO TABS: 10.0000 mg | ORAL_TABLET | Freq: Two times a day (BID) | ORAL | 0 refills | Status: DC

## 2021-12-06 NOTE — Progress Notes (Signed)
Subjective:    Patient ID: Jane English, female    DOB: 16-Jul-1971, 51 y.o.   MRN: [id number]  HPI: Jane English is a 51 y.o. female who returns for follow up appointment for chronic pain and medication refill. She states her pain is located in her lower back radiating into her left lower extremity. She rates her pain 8. Her current exercise regime is walking, performing stretching exercises and physical therapy two days week.  Jane English equivalent is 30.00 MME.   Last UDS was Performed on 11/07/2021, it was consistent.      Pain Inventory Average Pain 9 Pain Right Now 8 My pain is constant, sharp, burning, dull, stabbing, tingling, and aching  In the last 24 hours, has pain interfered with the following? General activity 8 Relation with others 8 Enjoyment of life 8 What TIME of day is your pain at its worst? morning , evening, and night Sleep (in general) Good  Pain is worse with: walking, bending, sitting, standing, and some activites Pain improves with: rest, heat/ice, therapy/exercise, and medication Relief from Meds: 10      Family History  Problem Relation Age of Onset   Physical abuse Mother    Alcohol abuse Mother    Cirrhosis Mother    Lung cancer Father    Stomach cancer Father    Esophageal cancer Father    Alcohol abuse Father    Mental illness Father    Diabetes Sister    Hypertension Sister    Bipolar disorder Sister    Schizophrenia Sister    Diabetes Sister    Drug abuse Sister    HIV Sister    Pneumonia Sister        died as a baby   Alcohol abuse Brother    Hypertension Brother    Kidney disease Brother    Diabetes Brother    Drug abuse Brother    Mental illness Brother    Alcohol abuse Brother    Alcohol abuse Brother    Hypertension Brother    Diabetes Brother    Alcohol abuse Brother    Mental illness Brother        in [place]   Alcohol abuse Brother    Alcohol abuse Brother    Bipolar disorder Brother    Hypertension  Brother    Bipolar disorder Brother    Drug abuse Brother    Alcohol abuse Brother    Bipolar disorder Brother    Bipolar disorder Daughter    Bipolar disorder Son    Bipolar disorder Son    ADD / ADHD Neg Hx    Anxiety disorder Neg Hx    Dementia Neg Hx    Depression Neg Hx    OCD Neg Hx    Seizures Neg Hx    Paranoid behavior Neg Hx    Colon cancer Neg Hx    Rectal cancer Neg Hx    Social History   Socioeconomic History   Marital status: Married    Spouse name: Herbie [place]   Number of children: 4   Years of education: 12   Highest education level: Some college, no degree  Occupational History   Occupation: disabled  Tobacco Use   Smoking status: Never   Smokeless tobacco: Never  Vaping Use   Vaping Use: Never used  Substance and Sexual Activity   Alcohol use: No   Drug use: No   Sexual activity: Not Currently    Partners: Male  Birth control/protection: Surgical    Comment: tubal  Other Topics Concern   Not on file  Social History Narrative   Not on file   Social Determinants of Health   Financial Resource Strain: Low Risk    Difficulty of Paying Living Expenses: Not hard at all  Food Insecurity: No Food Insecurity   Worried About Charity fundraiser in the Last Year: Never true   [place] in the Last Year: Never true  Transportation Needs: No Transportation Needs   Lack of Transportation (Medical): No   Lack of Transportation (Non-Medical): No  Physical Activity: Insufficiently Active   Days of Exercise per Week: 3 days   Minutes of Exercise per Session: 30 min  Stress: No Stress Concern Present   Feeling of Stress : Only a little  Social Connections: Moderately Isolated   Frequency of Communication with Friends and Family: More than three times a week   Frequency of Social Gatherings with Friends and Family: Once a week   Attends Religious Services: Never   Marine scientist or Organizations: No   Attends Music therapist:  Never   Marital Status: Married   Past Surgical History:  Procedure Laterality Date   ANTERIOR CERVICAL DECOMP/DISCECTOMY FUSION  07/07/2012   Procedure: ANTERIOR CERVICAL DECOMPRESSION/DISCECTOMY FUSION 2 LEVELS;  Surgeon: Floyce Stakes, MD;  Location: [facility];  Service: Neurosurgery;  Laterality: N/A;  Cervical four-five, five - six  Anterior cervical decompression/diskectomy/fusion/plate   APPENDECTOMY  1986   BOWEL RESECTION N/A 07/29/2013   Procedure: serosal repair;  Surgeon: Adin Hector, MD;  Location: [facility];  Service: General;  Laterality: N/A;   CARPAL TUNNEL RELEASE Bilateral    COLON SURGERY N/A    Phreesia 07/29/2020   LAPAROSCOPY N/A 07/29/2013   Procedure: diagnostic laporoscopy;  Surgeon: Adin Hector, MD;  [facility];  Service: General;  Laterality: N/A;   LAPAROSCOPY N/A 08/16/2013   Procedure: LAPAROSCOPY DIAGNOSTIC/LYSIS OF ADHESIONS;  Surgeon: Adin Hector, MD;  [facility];  Service: General;  Laterality: N/A;   LAPAROTOMY N/A 08/16/2013   Procedure: EXPLORATORY LAPAROTOMY/SMALL BOWEL RESECTION (JEJUNUM);  Surgeon: Adin Hector, MD;  [facility];  Service: General;  Laterality: N/A;   LUMBAR SPINE SURGERY  2010   x 3   LYSIS OF ADHESION  2003   Dr. Irving Shows   LYSIS OF ADHESION N/A 07/29/2013   Procedure: LYSIS OF ADHESION;  Surgeon: Adin Hector, MD;  [facility];  Service: General;  Laterality: N/A;   [place]?   Linna Hoff, [place]   SPINAL CORD STIMULATOR IMPLANT     SPINE SURGERY N/A    Phreesia 07/29/2020   TRIGGER FINGER RELEASE  2009   right pinkie finger   TUBAL LIGATION  1994   Past Medical History:  Diagnosis Date   Anemia    Asthma    Asthma flare 04/09/2013   Back pain    Bronchitis    Chronic abdominal pain    Chronic constipation    Constipation due to opioid therapy    Depression    Depression, major, single episode, severe ([place]) 10/03/2018   PHQ 9 score of 15    Diabetes mellitus without complication ([place])    Diabetes mellitus, type II ([place])    DVT (deep venous thrombosis) ([place]) 2010   GERD (gastroesophageal reflux disease)    Heart murmur    no  cardiologist   Helicobacter pylori gastritis 06/11/2013   Colonoscopy Dr. Hilarie Fredrickson   Hypertension    IBS (irritable bowel syndrome)    Migraine headache    Neuropathy    Obesity    Obsessive-compulsive disorder    PSYCHOTIC D/O W/HALLUCINATIONS CONDS CLASS ELSW 03/04/2010   Qualifier: Diagnosis of  By: Moshe Cipro MD, Margaret     PTSD (post-traumatic stress disorder)    SBO (small bowel obstruction) ([place]) 08/09/2013   Seasonal allergies 12/10/2012   Seizures ([facility])    Shortness of breath    BP 132/84    Pulse 86    Temp 99.1 F (37.3 C)    Ht 5\' 6"  (1.676 m)    Wt 194 lb (88 kg)    SpO2 96%    BMI 31.31 kg/m   Opioid Risk Score:   Fall Risk Score:  `1  Depression screen PHQ 2/9  Depression screen [facility] 2/9 11/30/2021 11/08/2021 11/07/2021 10/09/2021 09/11/2021 07/12/2021 06/11/2021  Decreased Interest 0 0 0 0 0 0 0  Down, Depressed, Hopeless 0 0 0 0 0 0 0  PHQ - 2 Score 0 0 0 0 0 0 0  Altered sleeping - - - - - - -  Tired, decreased energy - - - - - - -  Change in appetite - - - - - - -  Feeling bad or failure about yourself  - - - - - - -  Trouble concentrating - - - - - - -  Moving slowly or fidgety/restless - - - - - - -  Suicidal thoughts - - - - - - -  PHQ-9 Score - - - - - - -  Difficult doing work/chores - - - - - - -  Some encounter information is confidential and restricted. Go to Review Flowsheets activity to see all data.  Some recent data might be hidden   . Review of Systems  Musculoskeletal:  Positive for back pain and gait problem.       Left knee pain      Objective:   Physical Exam Vitals and nursing note reviewed.  Constitutional:      Appearance: Normal appearance.  Cardiovascular:     Rate and Rhythm: Normal rate and regular rhythm.     Pulses: Normal pulses.     Heart sounds:  Normal heart sounds.  Pulmonary:     Effort: Pulmonary effort is normal.     Breath sounds: Normal breath sounds.  Musculoskeletal:     Cervical back: Normal range of motion and neck supple.     Comments: Normal Muscle Bulk and Muscle Testing Reveals:  Upper Extremities: Full ROM and Muscle Strength 5/5 Lumbar Paraspinal Tenderness: L-4-L-5 Mainly Left Side  Lower Extremities: Full ROM and Muscle Strength 5/5 Arises from Table with ease Narrow Based  Gait     Skin:    General: Skin is warm and dry.  Neurological:     Mental Status: She is alert and oriented to person, place, and time.  Psychiatric:        Mood and Affect: Mood normal.        Behavior: Behavior normal.         Assessment & Plan:  1. Chronic Pain of Bilateral Shoulders L>R: Continue HEP as Tolerated. Continue to Monitor. 12/06/2021 2. Failed Back Syndrome of Lumbar Syndrome : Continue HEP as Tolerated. Continue current medication regimen. Continue to Monitor. 12/06/2021 3. Chronic Lower Back Pain without Sciatica: Continue HEP as Tolerated. Continue  to Monitor.12/06/2021 4. Left Knee Pain: No Complaints today. Continue HEP as Tolerated. Continue to Monitor.12/07/2021 5. Muscle Spasm of Shoulders/ Upper Back : Continue Flexeril. Continue to Monitor.12/07/2021 6. Chronic Pain Syndrome: Refilled: Oxycodone 5mg /325 mg two tablets twice a day #120. We will continue the opioid monitoring program, this consists of regular clinic visits, examinations, urine drug screen, pill counts as well as use of [place] Controlled Substance Reporting system. A 12 month History has been reviewed on the [place] Controlled Substance Reporting System on 12/07/2021.   F/U in 1 month

## 2021-12-18 ENCOUNTER — Ambulatory Visit (HOSPITAL_BASED_OUTPATIENT_CLINIC_OR_DEPARTMENT_OTHER): Payer: Medicare HMO | Admitting: Physical Therapy

## 2021-12-29 ENCOUNTER — Other Ambulatory Visit: Payer: Self-pay | Admitting: Family Medicine

## 2022-01-04 ENCOUNTER — Telehealth (HOSPITAL_COMMUNITY): Payer: Self-pay | Admitting: Psychiatry

## 2022-01-04 ENCOUNTER — Ambulatory Visit: Payer: Medicare HMO | Admitting: Physical Medicine and Rehabilitation

## 2022-01-04 NOTE — Telephone Encounter (Signed)
Called to schedule NP appt with patient she advised she does not have time to schedule right now but will call back ?

## 2022-01-08 ENCOUNTER — Encounter: Payer: Self-pay | Admitting: Physical Medicine and Rehabilitation

## 2022-01-08 ENCOUNTER — Encounter: Payer: Medicare HMO | Attending: Physical Medicine and Rehabilitation | Admitting: Physical Medicine and Rehabilitation

## 2022-01-08 ENCOUNTER — Other Ambulatory Visit: Payer: Self-pay

## 2022-01-08 VITALS — BP 159/95 | HR 96 | Ht 66.0 in | Wt 193.0 lb

## 2022-01-08 DIAGNOSIS — M542 Cervicalgia: Secondary | ICD-10-CM | POA: Insufficient documentation

## 2022-01-08 MED ORDER — OXYCODONE HCL 5 MG PO TABS: 10.0000 mg | ORAL_TABLET | Freq: Two times a day (BID) | ORAL | 0 refills | Status: DC

## 2022-01-08 NOTE — Progress Notes (Signed)
Trigger Point Injection  Indication: Cervical myofascial pain not relieved by medication management and other conservative care.  Informed consent was obtained after describing risk and benefits of the procedure with the patient, this includes bleeding, bruising, infection and medication side effects.  The patient wishes to proceed and has given written consent.  The patient was placed in a seated position.  The area of pain was marked and prepped with Betadine.  It was entered with a 25-gauge 1/2 inch needle and a total of 5 mL of 1% lidocaine and normal saline was injected into a total of 4 trigger points, after negative draw back for blood.  The patient tolerated the procedure well.  Post procedure instructions were given.  

## 2022-01-08 NOTE — Addendum Note (Signed)
Addended by: Izora Ribas on: 01/08/2022 03:18 PM ? ? Modules accepted: Orders ? ?

## 2022-01-15 ENCOUNTER — Ambulatory Visit (INDEPENDENT_AMBULATORY_CARE_PROVIDER_SITE_OTHER): Payer: Medicare HMO

## 2022-01-15 ENCOUNTER — Other Ambulatory Visit: Payer: Self-pay

## 2022-01-15 DIAGNOSIS — Z111 Encounter for screening for respiratory tuberculosis: Secondary | ICD-10-CM

## 2022-01-24 ENCOUNTER — Ambulatory Visit (HOSPITAL_COMMUNITY): Payer: Medicare HMO

## 2022-01-29 ENCOUNTER — Telehealth: Payer: Self-pay

## 2022-01-29 NOTE — Telephone Encounter (Signed)
Patient called asked if nurse would give her a call about her tb test. ?

## 2022-01-30 ENCOUNTER — Other Ambulatory Visit: Payer: Self-pay

## 2022-01-30 MED ORDER — POTASSIUM CHLORIDE 20 MEQ/15ML (10%) PO SOLN: 20.0000 meq | Freq: Two times a day (BID) | ORAL | 1 refills | Status: DC

## 2022-01-30 NOTE — Telephone Encounter (Signed)
Will come fri to have ppd for work placed  ?

## 2022-01-31 ENCOUNTER — Ambulatory Visit: Payer: Medicare HMO

## 2022-01-31 ENCOUNTER — Ambulatory Visit: Payer: Medicare HMO | Admitting: Physical Medicine and Rehabilitation

## 2022-02-01 ENCOUNTER — Ambulatory Visit (INDEPENDENT_AMBULATORY_CARE_PROVIDER_SITE_OTHER): Payer: Medicare HMO

## 2022-02-01 DIAGNOSIS — Z111 Encounter for screening for respiratory tuberculosis: Secondary | ICD-10-CM | POA: Diagnosis not present

## 2022-02-04 ENCOUNTER — Ambulatory Visit: Payer: Medicare HMO | Admitting: Registered Nurse

## 2022-02-04 LAB — TB SKIN TEST
Induration: 0 mm
TB Skin Test: NEGATIVE

## 2022-02-05 ENCOUNTER — Encounter: Payer: Medicare HMO | Attending: Physical Medicine and Rehabilitation | Admitting: Registered Nurse

## 2022-02-05 ENCOUNTER — Encounter: Payer: Self-pay | Admitting: Registered Nurse

## 2022-02-05 VITALS — BP 106/74 | HR 91 | Ht 66.0 in | Wt 193.2 lb

## 2022-02-05 DIAGNOSIS — M778 Other enthesopathies, not elsewhere classified: Secondary | ICD-10-CM | POA: Insufficient documentation

## 2022-02-05 DIAGNOSIS — G894 Chronic pain syndrome: Secondary | ICD-10-CM | POA: Insufficient documentation

## 2022-02-05 DIAGNOSIS — M5416 Radiculopathy, lumbar region: Secondary | ICD-10-CM | POA: Diagnosis not present

## 2022-02-05 DIAGNOSIS — Z79891 Long term (current) use of opiate analgesic: Secondary | ICD-10-CM | POA: Insufficient documentation

## 2022-02-05 DIAGNOSIS — Z5181 Encounter for therapeutic drug level monitoring: Secondary | ICD-10-CM | POA: Diagnosis not present

## 2022-02-05 DIAGNOSIS — M961 Postlaminectomy syndrome, not elsewhere classified: Secondary | ICD-10-CM | POA: Insufficient documentation

## 2022-02-05 MED ORDER — OXYCODONE HCL 5 MG PO TABS: 10.0000 mg | ORAL_TABLET | Freq: Two times a day (BID) | ORAL | 0 refills | Status: DC

## 2022-02-05 NOTE — Progress Notes (Signed)
? ?Subjective:  ? ? Patient ID: Jane English, female    DOB: 08/21/1971, 51 y.o.   MRN: [id number] ? ?HPI: Jane English is a 51 y.o. female who returns for follow up appointment for chronic pain and medication refill. She states her pain is located in her  left shoulder and lower back pain radiating into her left lower extremity. She rates her pain 9. Her current exercise regime is walking and performing stretching exercises. ? ?Ms. Muzyka Morphine equivalent is 30.00 MME.   UDS was ordered today.  ?  ? ?Pain Inventory ?Average Pain 10 ?Pain Right Now 9 ?My pain is sharp, burning, dull, stabbing, tingling, and aching ? ?In the last 24 hours, has pain interfered with the following? ?General activity 8 ?Relation with others 8 ?Enjoyment of life 8 ?What TIME of day is your pain at its worst? morning , evening, and night ?Sleep (in general) Fair ? ?Pain is worse with: walking, bending, sitting, and standing ?Pain improves with: heat/ice and medication ?Relief from Meds: 8 ? ?Family History  ?Problem Relation Age of Onset  ? Physical abuse Mother   ? Alcohol abuse Mother   ? Cirrhosis Mother   ? Lung cancer Father   ? Stomach cancer Father   ? Esophageal cancer Father   ? Alcohol abuse Father   ? Mental illness Father   ? Diabetes Sister   ? Hypertension Sister   ? Bipolar disorder Sister   ? Schizophrenia Sister   ? Diabetes Sister   ? Drug abuse Sister   ? HIV Sister   ? Pneumonia Sister   ?     died as a baby  ? Alcohol abuse Brother   ? Hypertension Brother   ? Kidney disease Brother   ? Diabetes Brother   ? Drug abuse Brother   ? Mental illness Brother   ? Alcohol abuse Brother   ? Alcohol abuse Brother   ? Hypertension Brother   ? Diabetes Brother   ? Alcohol abuse Brother   ? Mental illness Brother   ?     in [place]  ? Alcohol abuse Brother   ? Alcohol abuse Brother   ? Bipolar disorder Brother   ? Hypertension Brother   ? Bipolar disorder Brother   ? Drug abuse Brother   ? Alcohol abuse Brother   ? Bipolar  disorder Brother   ? Bipolar disorder Daughter   ? Bipolar disorder Son   ? Bipolar disorder Son   ? ADD / ADHD Neg Hx   ? Anxiety disorder Neg Hx   ? Dementia Neg Hx   ? Depression Neg Hx   ? OCD Neg Hx   ? Seizures Neg Hx   ? Paranoid behavior Neg Hx   ? Colon cancer Neg Hx   ? Rectal cancer Neg Hx   ? ?Social History  ? ?Socioeconomic History  ? Marital status: Married  ?  Spouse name: Herbie Baltimore  ? Number of children: 4  ? Years of education: 6  ? Highest education level: Some college, no degree  ?Occupational History  ? Occupation: disabled  ?Tobacco Use  ? Smoking status: Never  ? Smokeless tobacco: Never  ?Vaping Use  ? Vaping Use: Never used  ?Substance and Sexual Activity  ? Alcohol use: No  ? Drug use: No  ? Sexual activity: Not Currently  ?  Partners: Male  ?  Birth control/protection: Surgical  ?  Comment: tubal  ?Other Topics Concern  ?  Not on file  ?Social History Narrative  ? Not on file  ? ?Social Determinants of Health  ? ?Financial Resource Strain: Low Risk   ? Difficulty of Paying Living Expenses: Not hard at all  ?Food Insecurity: No Food Insecurity  ? Worried About Charity fundraiser in the Last Year: Never true  ? Ran Out of Food in the Last Year: Never true  ?Transportation Needs: No Transportation Needs  ? Lack of Transportation (Medical): No  ? Lack of Transportation (Non-Medical): No  ?Physical Activity: Insufficiently Active  ? Days of Exercise per Week: 3 days  ? Minutes of Exercise per Session: 30 min  ?Stress: No Stress Concern Present  ? Feeling of Stress : Only a little  ?Social Connections: Moderately Isolated  ? Frequency of Communication with Friends and Family: More than three times a week  ? Frequency of Social Gatherings with Friends and Family: Once a week  ? Attends Religious Services: Never  ? Active Member of Clubs or Organizations: No  ? Attends Archivist Meetings: Never  ? Marital Status: Married  ? ?Past Surgical History:  ?Procedure Laterality Date  ? ANTERIOR  CERVICAL DECOMP/DISCECTOMY FUSION  07/07/2012  ? Procedure: ANTERIOR CERVICAL DECOMPRESSION/DISCECTOMY FUSION 2 LEVELS;  Surgeon: Floyce Stakes, MD;  Location: [facility];  Service: Neurosurgery;  Laterality: N/A;  Cervical four-five, five - six  Anterior cervical decompression/diskectomy/fusion/plate  ? APPENDECTOMY  1986  ? BOWEL RESECTION N/A 07/29/2013  ? Procedure: serosal repair;  Surgeon: Adin Hector, MD;  Location: [facility];  Service: General;  Laterality: N/A;  ? CARPAL TUNNEL RELEASE Bilateral   ? COLON SURGERY N/A   ? Phreesia 07/29/2020  ? LAPAROSCOPY N/A 07/29/2013  ? Procedure: diagnostic laporoscopy;  Surgeon: Adin Hector, MD;  Location: [facility];  Service: General;  Laterality: N/A;  ? LAPAROSCOPY N/A 08/16/2013  ? Procedure: LAPAROSCOPY DIAGNOSTIC/LYSIS OF ADHESIONS;  Surgeon: Adin Hector, MD;  [facility]: [facility];  Service: General;  Laterality: N/A;  ? LAPAROTOMY N/A 08/16/2013  ? Procedure: EXPLORATORY LAPAROTOMY/SMALL BOWEL RESECTION (JEJUNUM);  Surgeon: Adin Hector, MD;  Location: [facility];  Service: General;  Laterality: N/A;  ? [place]  2010  ? x 3  ? LYSIS OF ADHESION  2003  ? Dr. Irving Shows  ? LYSIS OF ADHESION N/A 07/29/2013  ? Procedure: LYSIS OF ADHESION;  Surgeon: Adin Hector, MD;  [facility]: [facility];  Service: General;  Laterality: N/A;  ? OOPHORECTOMY    ? PARTIAL HYSTERECTOMY  1990s?  ? [place], [place]  ? SPINAL CORD STIMULATOR IMPLANT    ? SPINE SURGERY N/A   ? Phreesia 07/29/2020  ? TRIGGER FINGER RELEASE  2009  ? right pinkie finger  ? TUBAL LIGATION  1994  ? ?Past Surgical History:  ?Procedure Laterality Date  ? ANTERIOR CERVICAL DECOMP/DISCECTOMY FUSION  07/07/2012  ? Procedure: ANTERIOR CERVICAL DECOMPRESSION/DISCECTOMY FUSION 2 LEVELS;  Surgeon: Floyce Stakes, MD;  [facility]: [facility];  Service: Neurosurgery;  Laterality: N/A;  Cervical four-five, five - six  Anterior cervical decompression/diskectomy/fusion/plate  ? APPENDECTOMY  1986  ? BOWEL  RESECTION N/A 07/29/2013  ? Procedure: serosal repair;  Surgeon: Adin Hector, MD;  [facility];  Service: General;  Laterality: N/A;  ? CARPAL TUNNEL RELEASE Bilateral   ? COLON SURGERY N/A   ? Phreesia 07/29/2020  ? LAPAROSCOPY N/A 07/29/2013  ? Procedure: diagnostic laporoscopy;  Surgeon: Adin Hector, MD;  [facility];  Service: General;  Laterality: N/A;  ? LAPAROSCOPY N/A 08/16/2013  ? Procedure: LAPAROSCOPY DIAGNOSTIC/LYSIS OF ADHESIONS;  Surgeon: Adin Hector, MD;  [facility];  Service: General;  Laterality: N/A;  ? LAPAROTOMY N/A 08/16/2013  ? Procedure: EXPLORATORY LAPAROTOMY/SMALL BOWEL RESECTION (JEJUNUM);  Surgeon: Adin Hector, MD;  [facility];  Service: General;  Laterality: N/A;  ? [place]  2010  ? x 3  ? LYSIS OF ADHESION  2003  ? Dr. Irving Shows  ? LYSIS OF ADHESION N/A 07/29/2013  ? Procedure: LYSIS OF ADHESION;  Surgeon: Adin Hector, MD;  [facility];  Service: General;  Laterality: N/A;  ? OOPHORECTOMY    ? PARTIAL HYSTERECTOMY  1990s?  ? [place]  ? SPINAL CORD STIMULATOR IMPLANT    ? SPINE SURGERY N/A   ? Phreesia 07/29/2020  ? TRIGGER FINGER RELEASE  2009  ? right pinkie finger  ? TUBAL LIGATION  1994  ? ?Past Medical History:  ?Diagnosis Date  ? Anemia   ? Asthma   ? Asthma flare 04/09/2013  ? Back pain   ? Bronchitis   ? Chronic abdominal pain   ? Chronic constipation   ? Constipation due to opioid therapy   ? Depression   ? Depression, major, single episode, severe ([place]) 10/03/2018  ? PHQ 9 score of 15  ? Diabetes mellitus without complication ([place])   ? Diabetes mellitus, type II ([place])   ? DVT (deep venous thrombosis) ([place]) 2010  ? GERD (gastroesophageal reflux disease)   ? Heart murmur   ? no cardiologist  ? Helicobacter pylori gastritis 06/11/2013  ? Colonoscopy Dr. Hilarie Fredrickson  ? Hypertension   ? IBS (irritable bowel syndrome)   ? Migraine headache   ? Neuropathy   ? Obesity   ? Obsessive-compulsive disorder   ? PSYCHOTIC D/O  W/HALLUCINATIONS CONDS CLASS ELSW 03/04/2010  ? Qualifier: Diagnosis of  By: Moshe Cipro MD, Joycelyn Schmid    ? PTSD (post-traumatic stress disorder)   ? SBO (small bowel obstruction) ([place]) 08/09/2013  ? Seasonal allergies 12/10/2012  ? Se

## 2022-02-09 LAB — TOXASSURE SELECT,+ANTIDEPR,UR

## 2022-02-12 ENCOUNTER — Telehealth: Payer: Self-pay | Admitting: *Deleted

## 2022-02-12 ENCOUNTER — Other Ambulatory Visit: Payer: Self-pay | Admitting: Family Medicine

## 2022-02-12 NOTE — Telephone Encounter (Signed)
Urine drug screen for this encounter is consistent for prescribed medication 

## 2022-02-19 ENCOUNTER — Encounter (HOSPITAL_BASED_OUTPATIENT_CLINIC_OR_DEPARTMENT_OTHER): Payer: Medicare HMO | Admitting: Physical Medicine and Rehabilitation

## 2022-02-19 DIAGNOSIS — M5416 Radiculopathy, lumbar region: Secondary | ICD-10-CM

## 2022-02-19 NOTE — Progress Notes (Signed)
? ?Subjective:  ? ? Patient ID: Jane English, female    DOB: 02-15-1971, 51 y.o.   MRN: [id number] ? ?HPI:  ?An audio/video tele-health visit is felt to be the most appropriate encounter for this patient at this time. This is a follow up tele-visit via phone. The patient is at home. MD is at office. Prior to scheduling this appointment, our staff discussed the limitations of evaluation and management by telemedicine and the availability of in-person appointments. The patient expressed understanding and agreed to proceed.  ? ?Jane English is a 51 y.o. female who returns for follow-up appointment for chronic pain in her back secondary to failed back syndrome, bilateral knees secondary to chonrdomalacia of bilateral knees, pain in bilateral hands secondary to osteoarthritis, oxycodone refill.  ? ?1) Failed back syndrome ?-Her current exercise regime is walking and performing stretching exercises. ?-Last UDS was Performed on 01/26/2021, it was consistent.  ?-She has been taking oxycodone PRN (30MME) for years- it is helpful for her pain ?-she has had negative responses with Gabapentin and Lyrica.  ?-pain has been really severe recently.  ?-she has never tried Cymbalta.  ?-she has been experiencing more left sided low back pain since she has started working at [employer] ?-no more falls.  ?-[employer] has been respecting the note.  ?-she hopes to see her son and grandchildren soon.  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 ? ? ?2) Chondromalacia of bilateral knees ? ?3) Anxiety ?-she receives Alprazolam from Dr. Moshe Cipro, PCP- dose was recently reduced in half.  ?-[name] has discussed the black box warning of using opioids and benzodiazepines and highlighted the dangers of using these drugs together and discussed the adverse events including respiratory suppression, overdose, cognitive impairment and importance of compliance with current regimen.  ?-she has been referred to therapy by her PCP.  ? ?4) Osteoarthritis of  bilateral hands ? ?5) Right buttock pain ? ?6) Myofascial pain ? ?7) Chronic constipation: ?-has followed with GI since last visit ? ?8) Prediabetes ?-she was advised by GI to discuss Wegovy vs. Ozempic with her PCP for weight loss- will also help with diabetes.  ?-there is plan for repeat colostomy- she had a bad experience with this last time.  ? ?9) Nausea: she does feel some some nausea with her pain medications.  ? ?10) Low mood ?-at times ?-she is handling at well.  ?-She loves to meet new people through her work.  ?-she has started a new piercing job.  ? ?11) Left lumbar radiculitis: ?- it is currently really bothering her  ?- she has had to take more oxycodone and wanted to let us know ?-she purchased an over the counter heating oil but has not tried that ?-she cannot tolerate gabapentin since it has caused her to sleep walk before ?-she has had a hard time going to work due to the pain ? ?  ?Pain Inventory ?Average Pain 9 ?Pain Right Now 8 ?My pain is intermittent, sharp, burning, dull, stabbing, tingling, and aching ? ?In the last 24 hours, has pain interfered with the following? ?General activity 8 ?Relation with others 8 ?Enjoyment of life 10 ?What TIME of day is your pain at its worst? Morning and night ?Sleep (in general) Fair ? ?Pain is worse with: walking, bending, sitting, and standing, some activities ?Pain improves with: rest, heat/ice, and medication ?Relief from Meds: 10 ? ?Family History  ?Problem Relation Age of Onset  ? Physical abuse Mother   ? Alcohol  abuse Mother   ? Cirrhosis Mother   ? Lung cancer Father   ? Stomach cancer Father   ? Esophageal cancer Father   ? Alcohol abuse Father   ? Mental illness Father   ? Diabetes Sister   ? Hypertension Sister   ? Bipolar disorder Sister   ? Schizophrenia Sister   ? Diabetes Sister   ? Drug abuse Sister   ? HIV Sister   ? Pneumonia Sister   ?     died as a baby  ? Alcohol abuse Brother   ? Hypertension Brother   ? Kidney disease Brother   ?  Diabetes Brother   ? Drug abuse Brother   ? Mental illness Brother   ? Alcohol abuse Brother   ? Alcohol abuse Brother   ? Hypertension Brother   ? Diabetes Brother   ? Alcohol abuse Brother   ? Mental illness Brother   ?     in [place]  ? Alcohol abuse Brother   ? Alcohol abuse Brother   ? Bipolar disorder Brother   ? Hypertension Brother   ? Bipolar disorder Brother   ? Drug abuse Brother   ? Alcohol abuse Brother   ? Bipolar disorder Brother   ? Bipolar disorder Daughter   ? Bipolar disorder Son   ? Bipolar disorder Son   ? ADD / ADHD Neg Hx   ? Anxiety disorder Neg Hx   ? Dementia Neg Hx   ? Depression Neg Hx   ? OCD Neg Hx   ? Seizures Neg Hx   ? Paranoid behavior Neg Hx   ? Colon cancer Neg Hx   ? Rectal cancer Neg Hx   ? ?Social History  ? ?Socioeconomic History  ? Marital status: Married  ?  Spouse name: Jane English  ? Number of children: 4  ? Years of education: 34  ? Highest education level: Some college, no degree  ?Occupational History  ? Occupation: disabled  ?Tobacco Use  ? Smoking status: Never  ? Smokeless tobacco: Never  ?Vaping Use  ? Vaping Use: Never used  ?Substance and Sexual Activity  ? Alcohol use: No  ? Drug use: No  ? Sexual activity: Not Currently  ?  Partners: Male  ?  Birth control/protection: Surgical  ?  Comment: tubal  ?Other Topics Concern  ? Not on file  ?Social History Narrative  ? Not on file  ? ?Social Determinants of Health  ? ?Financial Resource Strain: Low Risk   ? Difficulty of Paying Living Expenses: Not hard at all  ?Food Insecurity: No Food Insecurity  ? Worried About Charity fundraiser in the Last Year: Never true  ? Ran Out of Food in the Last Year: Never true  ?Transportation Needs: No Transportation Needs  ? Lack of Transportation (Medical): No  ? Lack of Transportation (Non-Medical): No  ?Physical Activity: Insufficiently Active  ? Days of Exercise per Week: 3 days  ? Minutes of Exercise per Session: 30 min  ?Stress: No Stress Concern Present  ? Feeling of Stress : Only a  little  ?Social Connections: Moderately Isolated  ? Frequency of Communication with Friends and Family: More than three times a week  ? Frequency of Social Gatherings with Friends and Family: Once a week  ? Attends Religious Services: Never  ? Active Member of Clubs or Organizations: No  ? Attends Archivist Meetings: Never  ? Marital Status: Married  ? ?Past Surgical History:  ?Procedure  Laterality Date  ? ANTERIOR CERVICAL DECOMP/DISCECTOMY FUSION  07/07/2012  ? Procedure: ANTERIOR CERVICAL DECOMPRESSION/DISCECTOMY FUSION 2 LEVELS;  Surgeon: Floyce Stakes, MD;  Location: [facility];  Service: Neurosurgery;  Laterality: N/A;  Cervical four-five, five - six  Anterior cervical decompression/diskectomy/fusion/plate  ? APPENDECTOMY  1986  ? BOWEL RESECTION N/A 07/29/2013  ? Procedure: serosal repair;  Surgeon: Adin Hector, MD;  [facility];  Service: General;  Laterality: N/A;  ? CARPAL TUNNEL RELEASE Bilateral   ? COLON SURGERY N/A   ? [employer]rees[employer] 07/29/2020  ? LAPAROSCOPY N/A 07/29/2013  ? Procedure: diagnostic laporoscopy;  Surgeon: Adin Hector, MD;  [facility];  Service: General;  Laterality: N/A;  ? LAPAROSCOPY N/A 08/16/2013  ? Procedure: LAPAROSCOPY DIAGNOSTIC/LYSIS OF ADHESIONS;  Surgeon: Adin Hector, MD;  [facility];  Service: General;  Laterality: N/A;  ? LAPAROTOMY N/A 08/16/2013  ? Procedure: EXPLORATORY LAPAROTOMY/SMALL BOWEL RESECTION (JEJUNUM);  Surgeon: Adin Hector, MD;  [facility];  Service: General;  Laterality: N/A;  ? [place]  2010  ? x 3  ? LYSIS OF ADHESION  2003  ? Dr. Irving Shows  ? LYSIS OF ADHESION N/A 07/29/2013  ? Procedure: LYSIS OF ADHESION;  Surgeon: Adin Hector, MD;  [facility];  Service: General;  Laterality: N/A;  ? OOPHORECTOMY    ? PARTIAL HYSTERECTOMY  1990s?  ? [place], [place]  ? SPINAL CORD STIMULATOR IMPLANT    ? SPINE SURGERY N/A   ? Phreesia 07/29/2020  ? TRIGGER FINGER RELEASE  2009  ? right pinkie finger  ?  TUBAL LIGATION  1994  ? ?Past Surgical History:  ?Procedure Laterality Date  ? ANTERIOR CERVICAL DECOMP/DISCECTOMY FUSION  07/07/2012  ? Procedure: ANTERIOR CERVICAL DECOMPRESSION/DISCECTOMY FUSION 2 LEVELS;  Renford Dills

## 2022-02-26 ENCOUNTER — Ambulatory Visit: Payer: Medicare HMO

## 2022-02-26 ENCOUNTER — Other Ambulatory Visit: Payer: Self-pay | Admitting: Allergy

## 2022-02-26 ENCOUNTER — Other Ambulatory Visit: Payer: Self-pay | Admitting: Family Medicine

## 2022-02-26 LAB — HM DIABETES EYE EXAM

## 2022-02-28 ENCOUNTER — Encounter: Payer: Medicare HMO | Admitting: Family Medicine

## 2022-03-01 NOTE — Progress Notes (Unsigned)
?????? ??? ??   5? 4?? ?? ?? ?? ????? ?? ??? ????.  ?? ? ?? ???????? ???????? ??? ?? ???? ?????. ???? ?? ???! ???

## 2022-03-08 ENCOUNTER — Ambulatory Visit: Payer: Medicare HMO | Admitting: Physical Medicine and Rehabilitation

## 2022-03-12 ENCOUNTER — Encounter: Payer: Self-pay | Admitting: Physical Medicine and Rehabilitation

## 2022-03-12 ENCOUNTER — Encounter: Payer: Medicare HMO | Attending: Physical Medicine and Rehabilitation | Admitting: Physical Medicine and Rehabilitation

## 2022-03-12 ENCOUNTER — Ambulatory Visit: Payer: Medicare HMO | Admitting: Physical Medicine and Rehabilitation

## 2022-03-12 VITALS — BP 145/90 | HR 97 | Ht 66.0 in | Wt 194.0 lb

## 2022-03-12 DIAGNOSIS — M5416 Radiculopathy, lumbar region: Secondary | ICD-10-CM | POA: Diagnosis not present

## 2022-03-12 DIAGNOSIS — M961 Postlaminectomy syndrome, not elsewhere classified: Secondary | ICD-10-CM | POA: Diagnosis not present

## 2022-03-12 MED ORDER — AMITRIPTYLINE HCL 10 MG PO TABS: 10.0000 mg | ORAL_TABLET | Freq: Every day | ORAL | 1 refills | Status: DC

## 2022-03-12 MED ORDER — OXYCODONE HCL 5 MG PO TABS: 10.0000 mg | ORAL_TABLET | Freq: Two times a day (BID) | ORAL | 0 refills | Status: DC

## 2022-03-12 NOTE — Progress Notes (Signed)
? ?Subjective:  ? ? Patient ID: Jane English, female    DOB: 12/08/1970, 51 y.o.   MRN: [id number] ? ?HPI:  ? ?Jane English is a 51 y.o. female who returns for follow-up appointment for chronic pain in her back secondary to failed back syndrome, bilateral knees secondary to chonrdomalacia of bilateral knees, pain in bilateral hands secondary to osteoarthritis, left sided lower back pain ? ?1) Failed back syndrome ?-Her current exercise regime is walking and performing stretching exercises. ?-Last UDS was Performed on 01/26/2021, it was consistent.  ?-She has been taking oxycodone PRN (30MME) for years- it is helpful for her pain ?-she has had negative responses with Gabapentin and Lyrica.  ?-pain has been really severe recently.  ?-she has never tried Cymbalta.  ?-she has been experiencing more left sided low back pain since she has started working at [employer] ?-no more falls.  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 ?-she asks if a lady reached out to be about the stimulator machine ? ? ?2) Chondromalacia of bilateral knees ? ?3) Anxiety ?-she receives Alprazolam from Dr. Moshe Cipro, PCP- dose was recently reduced in half.  ?-Jane English has discussed the black box warning of using opioids and benzodiazepines and highlighted the dangers of using these drugs together and discussed the adverse events including respiratory suppression, overdose, cognitive impairment and importance of compliance with current regimen.  ?-she has been referred to therapy by her PCP.  ? ?4) Osteoarthritis of bilateral hands ? ?5) Right buttock pain ? ?6) Myofascial pain ? ?7) Chronic constipation: ?-has followed with GI since last visit ? ?8) Prediabetes ?-she was advised by GI to discuss Wegovy vs. Ozempic with her PCP for weight loss- will also help with diabetes.  ?-there is plan for repeat colostomy- she had a bad experience with this last time.   ? ?9) Nausea: she does feel some some nausea with her pain medications.  ? ?10) Low mood ?-at times ?-she is handling at well.  ?-She loves to meet new people through her work.  ?-she has started a new piercing job.  ? ?11) Left lumbar radiculopathy: ?- it is currently really bothering her  ?- she has had to take more oxycodone and wanted to let us know ?-she purchased an over the counter heating oil but has not tried that ?-she cannot tolerate gabapentin since it has caused her to sleep walk before ?-she has had a hard time going to work due to the pain ?-she has tried amitriptyline in the past but was started on '150mg'$  and this made her feel drunk. She has not tried '10mg'$ .  ?-pain has been very severe ?-feels shooting pain throughout left leg ?-notes weakness ?-working through the pain.  ? ?  ?Pain Inventory ?Average Pain 7 ?Pain Right Now 10 ?My pain is constant, sharp, burning, dull, stabbing, tingling, and aching ? ?In the last 24 hours, has pain interfered with the following? ?General activity 9 ?Relation with others 9 ?Enjoyment of life 9 ?What TIME of day is your pain at its worst? Morning and night ?Sleep (in general) Fair ? ?Pain is worse with: walking, bending, sitting, and standing, some activities ?Pain improves with: rest, heat/ice, and medication ?Relief from Meds: 9 ? ?Family History  ?Problem Relation Age of Onset  ? Physical abuse Mother   ? Alcohol abuse Mother   ? Cirrhosis Mother   ?  Lung cancer Father   ? Stomach cancer Father   ? Esophageal cancer Father   ? Alcohol abuse Father   ? Mental illness Father   ? Diabetes Sister   ? Hypertension Sister   ? Bipolar disorder Sister   ? Schizophrenia Sister   ? Diabetes Sister   ? Drug abuse Sister   ? HIV Sister   ? Pneumonia Sister   ?     died as a baby  ? Alcohol abuse Brother   ? Hypertension Brother   ? Kidney disease Brother   ? Diabetes Brother   ? Drug abuse Brother   ? Mental illness Brother   ? Alcohol abuse Brother   ? Alcohol abuse Brother    ? Hypertension Brother   ? Diabetes Brother   ? Alcohol abuse Brother   ? Mental illness Brother   ?     in [place]  ? Alcohol abuse Brother   ? Alcohol abuse Brother   ? Bipolar disorder Brother   ? Hypertension Brother   ? Bipolar disorder Brother   ? Drug abuse Brother   ? Alcohol abuse Brother   ? Bipolar disorder Brother   ? Bipolar disorder Daughter   ? Bipolar disorder Son   ? Bipolar disorder Son   ? ADD / ADHD Neg Hx   ? Anxiety disorder Neg Hx   ? Dementia Neg Hx   ? Depression Neg Hx   ? OCD Neg Hx   ? Seizures Neg Hx   ? Paranoid behavior Neg Hx   ? Colon cancer Neg Hx   ? Rectal cancer Neg Hx   ? ?Social History  ? ?Socioeconomic History  ? Marital status: Married  ?  Spouse name: Herbie Baltimore  ? Number of children: 4  ? Years of education: 52  ? Highest education level: Some college, no degree  ?Occupational History  ? Occupation: disabled  ?Tobacco Use  ? Smoking status: Never  ? Smokeless tobacco: Never  ?Vaping Use  ? Vaping Use: Never used  ?Substance and Sexual Activity  ? Alcohol use: No  ? Drug use: No  ? Sexual activity: Not Currently  ?  Partners: Male  ?  Birth control/protection: Surgical  ?  Comment: tubal  ?Other Topics Concern  ? Not on file  ?Social History Narrative  ? Not on file  ? ?Social Determinants of Health  ? ?Financial Resource Strain: Low Risk   ? Difficulty of Paying Living Expenses: Not hard at all  ?Food Insecurity: No Food Insecurity  ? Worried About Charity fundraiser in the Last Year: Never true  ? Ran Out of Food in the Last Year: Never true  ?Transportation Needs: No Transportation Needs  ? Lack of Transportation (Medical): No  ? Lack of Transportation (Non-Medical): No  ?Physical Activity: Insufficiently Active  ? Days of Exercise per Week: 3 days  ? Minutes of Exercise per Session: 30 min  ?Stress: No Stress Concern Present  ? Feeling of Stress : Only a little  ?Social Connections: Moderately Isolated  ? Frequency of Communication with Friends and Family: More than three  times a week  ? Frequency of Social Gatherings with Friends and Family: Once a week  ? Attends Religious Services: Never  ? Active Member of Clubs or Organizations: No  ? Attends Archivist Meetings: Never  ? Marital Status: Married  ? ?Past Surgical History:  ?Procedure Laterality Date  ? ANTERIOR CERVICAL DECOMP/DISCECTOMY FUSION  07/07/2012  ?  Procedure: ANTERIOR CERVICAL DECOMPRESSION/DISCECTOMY FUSION 2 LEVELS;  Surgeon: Floyce Stakes, MD;  Location: [facility];  Service: Neurosurgery;  Laterality: N/A;  Cervical four-five, five - six  Anterior cervical decompression/diskectomy/fusion/plate  ? APPENDECTOMY  1986  ? BOWEL RESECTION N/A 07/29/2013  ? Procedure: serosal repair;  Surgeon: Adin Hector, MD;  Location: [facility];  Service: General;  Laterality: N/A;  ? CARPAL TUNNEL RELEASE Bilateral   ? COLON SURGERY N/A   ? [employer] 07/29/2020  ? LAPAROSCOPY N/A 07/29/2013  ? Procedure: diagnostic laporoscopy;  Surgeon: Adin Hector, MD;  Location: [facility];  Service: General;  Laterality: N/A;  ? LAPAROSCOPY N/A 08/16/2013  ? Procedure: LAPAROSCOPY DIAGNOSTIC/LYSIS OF ADHESIONS;  Surgeon: Adin Hector, MD;  Location: [facility];  Service: General;  Laterality: N/A;  ? LAPAROTOMY N/A 08/16/2013  ? Procedure: EXPLORATORY LAPAROTOMY/SMALL BOWEL RESECTION (JEJUNUM);  Surgeon: Adin Hector, MD;  Location: [facility];  Service: General;  Laterality: N/A;  ? [place]  2010  ? x 3  ? LYSIS OF ADHESION  2003  ? Dr. Irving Shows  ? LYSIS OF ADHESION N/A 07/29/2013  ? Procedure: LYSIS OF ADHESION;  Surgeon: Adin Hector, MD;  Location: [facility];  Service: General;  Laterality: N/A;  ? OOPHORECTOMY    ? PARTIAL HYSTERECTOMY  1990s?  ? , [place]  ? SPINAL CORD STIMULATOR IMPLANT    ? SPINE SURGERY N/A   ? [employer] 07/29/2020  ? TRIGGER FINGER RELEASE  2009  ? right pinkie finger  ? TUBAL LIGATION  1994  ? ?Past Surgical History:  ?Procedure Laterality Date  ? ANTERIOR CERVICAL DECOMP/DISCECTOMY FUSION   07/07/2012  ? Procedure: ANTERIOR CERVICAL DECOMPRESSION/DISCECTOMY FUSION 2 LEVELS;  Surgeon: Floyce Stakes, MD;  Location: [facility];  Service: Neurosurgery;  Laterality: N/A;  Cervical four-

## 2022-03-19 ENCOUNTER — Encounter (HOSPITAL_BASED_OUTPATIENT_CLINIC_OR_DEPARTMENT_OTHER): Payer: Medicare HMO | Admitting: Physical Medicine and Rehabilitation

## 2022-03-19 DIAGNOSIS — M5416 Radiculopathy, lumbar region: Secondary | ICD-10-CM | POA: Diagnosis not present

## 2022-03-19 MED ORDER — MELOXICAM 7.5 MG PO TABS
7.5000 mg | ORAL_TABLET | Freq: Two times a day (BID) | ORAL | 11 refills | Status: DC | PRN
Start: 2022-03-19 — End: 2022-09-06

## 2022-03-19 MED ORDER — METHOCARBAMOL 500 MG PO TABS: 500.0000 mg | ORAL_TABLET | Freq: Four times a day (QID) | ORAL | 3 refills | Status: DC

## 2022-03-19 NOTE — Progress Notes (Signed)
? ?Subjective:  ? ? Patient ID: [name], female    DOB: 05-19-1971, 51 y.o.   MRN: [id number] ? ?HPI:  ?An audio/video tele-health visit is felt to be the most appropriate encounter for this patient at this time. This is a follow up tele-visit via phone. The patient is at home. MD is at office. Prior to scheduling this appointment, our staff discussed the limitations of evaluation and management by telemedicine and the availability of in-person appointments. The patient expressed understanding and agreed to proceed.  ? ?SHEQUITA PEPLINSKI is a 51 y.o. female who returns for follow-up appointment for chronic pain in her back secondary to failed back syndrome, bilateral knees secondary to chonrdomalacia of bilateral knees, pain in bilateral hands secondary to osteoarthritis, left sided lower back pain ? ?1) Failed back syndrome ?-Her current exercise regimen is walking and performing stretching exercises. ?-Last UDS was Performed on 01/26/2021, it was consistent.  ?-She has been taking oxycodone PRN (30MME) for years- it is helpful for her pain ?-she has had negative responses with Gabapentin and Lyrica.  ?-pain has been really severe recently.  ?-she has never tried Cymbalta.  ?-she has been experiencing more left sided low back pain since she has started working at [employer] ?-no more falls.  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 ?-she asks if a lady reached out to me about the stimulator machine, she would like to get her spinal cord stimulator removed. She does not want to get this removed where it was originally placed, but would rather get it removed at [place] ? ?2) Chondromalacia of bilateral knees ? ?3) Anxiety ?-she receives Alprazolam from Dr. Moshe Cipro, PCP- dose was recently reduced in half.  ?-[name] has discussed the black box warning of using opioids and benzodiazepines and highlighted the dangers of  using these drugs together and discussed the adverse events including respiratory suppression, overdose, cognitive impairment and importance of compliance with current regimen.  ?-she has been referred to therapy by her PCP.  ? ?4) Osteoarthritis of bilateral hands ? ?5) Right buttock pain ? ?6) Myofascial pain ? ?7) Chronic constipation: ?-has followed with GI since last visit ? ?8) Prediabetes ?-she was advised by GI to discuss Wegovy vs. Ozempic with her PCP for weight loss- will also help with diabetes.  ?-there is plan for repeat colostomy- she had a bad experience with this last time.  ? ?9) Nausea: she does feel some some nausea with her pain medications.  ? ?10) Low mood ?-at times ?-she is handling at well.  ?-She loves to meet new people through her work.  ?-she has started a new piercing job.  ? ?11) Left lumbar radiculopathy: ?- it is currently really bothering her  ?-it is severe all the time but she is still working through it ?-she is unable to have an MRI due to the spinal cord stimulator- she would like to get this removed as soon as possible ?-she is willing to try meloxicam, she has not tried this before ?-she is willing to tried robaxin, most muscle relaxers make her very sleepy ?- she has had to take more oxycodone and wanted to let us know ?-she purchased an over the counter heating oil but has not tried that ?-she cannot tolerate gabapentin since it has caused her to sleep walk before ?-she has had a hard time going to  work due to the pain ?-she has tried amitriptyline in the past but was started on '150mg'$  and this made her feel drunk. She has not tried '10mg'$ .  ?-pain has been very severe ?-feels shooting pain throughout left leg ?-notes weakness ?-working through the pain.  ? ?  ?Pain Inventory ?Average Pain 7 ?Pain Right Now 10 ?My pain is constant, sharp, burning, dull, stabbing, tingling, and aching ? ?In the last 24 hours, has pain interfered with the following? ?General activity  9 ?Relation with others 9 ?Enjoyment of life 9 ?What TIME of day is your pain at its worst? Morning and night ?Sleep (in general) Fair ? ?Pain is worse with: walking, bending, sitting, and standing, some activities ?Pain improves with: rest, heat/ice, and medication ?Relief from Meds: 9 ? ?Family History  ?Problem Relation Age of Onset  ? Physical abuse Mother   ? Alcohol abuse Mother   ? Cirrhosis Mother   ? Lung cancer Father   ? Stomach cancer Father   ? Esophageal cancer Father   ? Alcohol abuse Father   ? Mental illness Father   ? Diabetes Sister   ? Hypertension Sister   ? Bipolar disorder Sister   ? Schizophrenia Sister   ? Diabetes Sister   ? Drug abuse Sister   ? HIV Sister   ? Pneumonia Sister   ?     died as a baby  ? Alcohol abuse Brother   ? Hypertension Brother   ? Kidney disease Brother   ? Diabetes Brother   ? Drug abuse Brother   ? Mental illness Brother   ? Alcohol abuse Brother   ? Alcohol abuse Brother   ? Hypertension Brother   ? Diabetes Brother   ? Alcohol abuse Brother   ? Mental illness Brother   ?     in [place]  ? Alcohol abuse Brother   ? Alcohol abuse Brother   ? Bipolar disorder Brother   ? Hypertension Brother   ? Bipolar disorder Brother   ? Drug abuse Brother   ? Alcohol abuse Brother   ? Bipolar disorder Brother   ? Bipolar disorder Daughter   ? Bipolar disorder Son   ? Bipolar disorder Son   ? ADD / ADHD Neg Hx   ? Anxiety disorder Neg Hx   ? Dementia Neg Hx   ? Depression Neg Hx   ? OCD Neg Hx   ? Seizures Neg Hx   ? Paranoid behavior Neg Hx   ? Colon cancer Neg Hx   ? Rectal cancer Neg Hx   ? ?Social History  ? ?Socioeconomic History  ? Marital status: Married  ?  Spouse name: Herbie Baltimore  ? Number of children: 4  ? Years of education: 84  ? Highest education level: Some college, no degree  ?Occupational History  ? Occupation: disabled  ?Tobacco Use  ? Smoking status: Never  ? Smokeless tobacco: Never  ?Vaping Use  ? Vaping Use: Never used  ?Substance and Sexual Activity  ? Alcohol use:  No  ? Drug use: No  ? Sexual activity: Not Currently  ?  Partners: Male  ?  Birth control/protection: Surgical  ?  Comment: tubal  ?Other Topics Concern  ? Not on file  ?Social History Narrative  ? Not on file  ? ?Social Determinants of Health  ? ?Financial Resource Strain: Low Risk   ? Difficulty of Paying Living Expenses: Not hard at all  ?Food Insecurity: No Food Insecurity  ?  Worried About Charity fundraiser in the Last Year: Never true  ? Ran Out of Food in the Last Year: Never true  ?Transportation Needs: No Transportation Needs  ? Lack of Transportation (Medical): No  ? Lack of Transportation (Non-Medical): No  ?Physical Activity: Insufficiently Active  ? Days of Exercise per Week: 3 days  ? Minutes of Exercise per Session: 30 min  ?Stress: No Stress Concern Present  ? Feeling of Stress : Only a little  ?Social Connections: Moderately Isolated  ? Frequency of Communication with Friends and Family: More than three times a week  ? Frequency of Social Gatherings with Friends and Family: Once a week  ? Attends Religious Services: Never  ? Active Member of Clubs or Organizations: No  ? Attends Archivist Meetings: Never  ? Marital Status: Married  ? ?Past Surgical History:  ?Procedure Laterality Date  ? ANTERIOR CERVICAL DECOMP/DISCECTOMY FUSION  07/07/2012  ? Procedure: ANTERIOR CERVICAL DECOMPRESSION/DISCECTOMY FUSION 2 LEVELS;  Surgeon: Floyce Stakes, MD;  Location: [facility];  Service: Neurosurgery;  Laterality: N/A;  Cervical four-five, five - six  Anterior cervical decompression/diskectomy/fusion/plate  ? APPENDECTOMY  1986  ? BOWEL RESECTION N/A 07/29/2013  ? Procedure: serosal repair;  Surgeon: Adin Hector, MD;  Location: [facility];  Service: General;  Laterality: N/A;  ? CARPAL TUNNEL RELEASE Bilateral   ? COLON SURGERY N/A   ? [employer] 07/29/2020  ? LAPAROSCOPY N/A 07/29/2013  ? Procedure: diagnostic laporoscopy;  Surgeon: Adin Hector, MD;  Location: [facility];  Service: General;   Laterality: N/A;  ? LAPAROSCOPY N/A 08/16/2013  ? Procedure: LAPAROSCOPY DIAGNOSTIC/LYSIS OF ADHESIONS;  Surgeon: Adin Hector, MD;  Location: [facility];  Service: General;  Laterality: N/A;  ? LAPAROTOMY N/A 10/13/201

## 2022-03-21 ENCOUNTER — Other Ambulatory Visit (HOSPITAL_COMMUNITY)
Admission: RE | Admit: 2022-03-21 | Discharge: 2022-03-21 | Disposition: A | Discharge status: Home / Self Care | Payer: Medicare HMO | Source: Ambulatory Visit | Attending: Family Medicine | Admitting: Family Medicine

## 2022-03-21 ENCOUNTER — Ambulatory Visit (INDEPENDENT_AMBULATORY_CARE_PROVIDER_SITE_OTHER): Payer: Medicare HMO | Admitting: Family Medicine

## 2022-03-21 ENCOUNTER — Encounter: Payer: Self-pay | Admitting: Family Medicine

## 2022-03-21 VITALS — BP 147/86 | HR 110 | Ht 66.0 in | Wt 195.0 lb

## 2022-03-21 DIAGNOSIS — E1169 Type 2 diabetes mellitus with other specified complication: Secondary | ICD-10-CM

## 2022-03-21 DIAGNOSIS — Z1151 Encounter for screening for human papillomavirus (HPV): Secondary | ICD-10-CM | POA: Insufficient documentation

## 2022-03-21 DIAGNOSIS — Z0001 Encounter for general adult medical examination with abnormal findings: Secondary | ICD-10-CM

## 2022-03-21 DIAGNOSIS — Z124 Encounter for screening for malignant neoplasm of cervix: Secondary | ICD-10-CM

## 2022-03-21 DIAGNOSIS — E559 Vitamin D deficiency, unspecified: Secondary | ICD-10-CM

## 2022-03-21 DIAGNOSIS — I10 Essential (primary) hypertension: Secondary | ICD-10-CM

## 2022-03-21 DIAGNOSIS — Z01419 Encounter for gynecological examination (general) (routine) without abnormal findings: Secondary | ICD-10-CM | POA: Diagnosis not present

## 2022-03-21 MED ORDER — CLOTRIMAZOLE-BETAMETHASONE 1-0.05 % EX CREA: 1.0000 "application " | TOPICAL_CREAM | Freq: Two times a day (BID) | CUTANEOUS | 1 refills | Status: DC

## 2022-03-21 NOTE — Assessment & Plan Note (Signed)
Uncontrolled, out of clonidine , same refiled DASH diet and commitment to daily physical activity for a minimum of 30 minutes discussed and encouraged, as a part of hypertension management. The importance of attaining a healthy weight is also discussed.     03/21/2022   11:02 AM 03/12/2022   11:18 AM 02/05/2022    8:38 AM 01/08/2022    2:46 PM 12/06/2021    9:14 AM 11/07/2021   10:25 AM 10/12/2021    1:03 PM  BP/Weight  Systolic BP 833 825 053 976 734 193 790  Diastolic BP 86 90 74 95 84 68 86  Wt. (Lbs) 195 194 193.2 193 194 192   BMI 31.47 kg/m2 31.31 kg/m2 31.18 kg/m2 31.15 kg/m2 31.31 kg/m2 30.99 kg/m2

## 2022-03-21 NOTE — Assessment & Plan Note (Signed)
Jane English is reminded of the importance of commitment to daily physical activity for 30 minutes or more, as able and the need to limit carbohydrate intake to 30 to 60 grams per meal to help with blood sugar control.   The need to take medication as prescribed, test blood sugar as directed, and to call between visits if there is a concern that blood sugar is uncontrolled is also discussed.   Jane English is reminded of the importance of daily foot exam, annual eye examination, and good blood sugar, blood pressure and cholesterol control.     Latest Ref Rng & Units 05/14/2021    3:47 PM 04/06/2020    4:30 PM 01/04/2020    8:54 AM 12/06/2019    3:08 PM 04/21/2019    2:50 PM  Diabetic Labs  HbA1c 4.8 - 5.6 % 6.5   6.3   6.4    5.8    Chol 100 - 199 mg/dL 187     195   183    HDL >39 mg/dL 53     55   56    Calc LDL 0 - 99 mg/dL 104     117   109    Triglycerides 0 - 149 mg/dL 172     124   86    Creatinine 0.57 - 1.00 mg/dL 0.95   1.05    1.03   1.00        03/21/2022   11:02 AM 03/12/2022   11:18 AM 02/05/2022    8:38 AM 01/08/2022    2:46 PM 12/06/2021    9:14 AM 11/07/2021   10:25 AM 10/12/2021    1:03 PM  BP/Weight  Systolic BP 106 269 485 462 703 500 938  Diastolic BP 86 90 74 95 84 68 86  Wt. (Lbs) 195 194 193.2 193 194 192   BMI 31.47 kg/m2 31.31 kg/m2 31.18 kg/m2 31.15 kg/m2 31.31 kg/m2 30.99 kg/m2       Latest Ref Rng & Units 02/26/2022   12:00 AM  Foot/eye exam completion dates  Eye Exam No Retinopathy No Retinopathy          This result is from an external source.      Updated lab needed at/ before next visit.

## 2022-03-21 NOTE — Patient Instructions (Addendum)
F/u in 3 months, call if you need me sooner   hBA1C, cmp and EGFR, lipid, TSH, vit D and CBC    Please schedule mammogram at checkout  It is important that you exercise regularly at least 30 minutes 5 times a week. If you develop chest pain, have severe difficulty breathing, or feel very tired, stop exercising immediately and seek medical attention   You need shingrix vaccines  Thanks for choosing [facility], we consider it a privelige to serve you.

## 2022-03-21 NOTE — Progress Notes (Signed)
Jane English     MRN: [id number]      DOB: [birth year]  HPI: Patient is in for annual physical exam. Chronic low back pain worsening, and is managed through pain clinic Immunization is reviewed , and  updated if needed.   PE: BP (!) 147/86   Pulse (!) 110   Ht '5\' 6"'$  (1.676 m)   Wt 195 lb (88.5 kg)   SpO2 95%   BMI 31.47 kg/m   Pleasant  female, alert and oriented x 3, in no cardio-pulmonary distress. Afebrile. HEENT No facial trauma or asymetry. Sinuses non tender.  Extra occullar muscles intact.. External ears normal, . Neck: supple, no adenopathy,JVD or thyromegaly.No bruits.  Chest: Clear to ascultation bilaterally.No crackles or wheezes. Non tender to palpation  Breast: No asymetry,no masses or lumps. No tenderness. No nipple discharge or inversion. No axillary or supraclavicular adenopathy  Cardiovascular system; Heart sounds normal,  S1 and  S2 ,no S3.  No murmur, or thrill. Apical beat not displaced Peripheral pulses normal.  Abdomen: Soft, non tender, no organomegaly or masses. No bruits. Bowel sounds normal. No guarding, tenderness or rebound.   GU: External genitalia normal female genitalia , normal female distribution of hair. No lesions. Urethral meatus normal in size, no  Prolapse, no lesions visibly  Present. Bladder non tender. Vagina pink and moist , with no visible lesions , discharge present . Adequate pelvic support no  cystocele or rectocele noted Cervix pink and appears healthy, no lesions or ulcerations noted, no discharge noted from os Uterus normal size, no adnexal masses, no cervical motion or adnexal tenderness.   Musculoskeletal exam: Decreased  ROM of spine, hips , normal in shoulders and knees. No deformity ,swelling or crepitus noted. No muscle wasting or atrophy.   Neurologic: Cranial nerves 2 to 12 intact. Power, tone ,sensation and reflexes normal throughout. No disturbance in gait. No tremor.  Skin: Intact, no  ulceration, erythema , scaling or rash noted. Pigmentation normal throughout  Psych; Normal mood and affect. Judgement and concentration normal   Assessment & Plan:  Annual physical exam Annual exam as documented. Counseling done  re healthy lifestyle involving commitment to 150 minutes exercise per week, heart healthy diet, and attaining healthy weight.The importance of adequate sleep also discussed. Regular seat belt use and home safety, is also discussed. Changes in health habits are decided on by the patient with goals and time frames  set for achieving them. Immunization and cancer screening needs are specifically addressed at this visit.   Annual visit for general adult medical examination with abnormal findings Annual exam as documented. Counseling done  re healthy lifestyle involving commitment to 150 minutes exercise per week, heart healthy diet, and attaining healthy weight.The importance of adequate sleep also discussed. Regular seat belt use and home safety, is also discussed. Changes in health habits are decided on by the patient with goals and time frames  set for achieving them. Immunization and cancer screening needs are specifically addressed at this visit.   Type 2 diabetes mellitus with other specified complication [facility]) Jane English is reminded of the importance of commitment to daily physical activity for 30 minutes or more, as able and the need to limit carbohydrate intake to 30 to 60 grams per meal to help with blood sugar control.   The need to take medication as prescribed, test blood sugar as directed, and to call between visits if there is a concern that blood sugar is uncontrolled is also discussed.  Jane English is reminded of the importance of daily foot exam, annual eye examination, and good blood sugar, blood pressure and cholesterol control.     Latest Ref Rng & Units 05/14/2021    3:47 PM 04/06/2020    4:30 PM 01/04/2020    8:54 AM 12/06/2019    3:08 PM  04/21/2019    2:50 PM  Diabetic Labs  HbA1c 4.8 - 5.6 % 6.5   6.3   6.4    5.8    Chol 100 - 199 mg/dL 187     195   183    HDL >39 mg/dL 53     55   56    Calc LDL 0 - 99 mg/dL 104     117   109    Triglycerides 0 - 149 mg/dL 172     124   86    Creatinine 0.57 - 1.00 mg/dL 0.95   1.05    1.03   1.00        03/21/2022   11:02 AM 03/12/2022   11:18 AM 02/05/2022    8:38 AM 01/08/2022    2:46 PM 12/06/2021    9:14 AM 11/07/2021   10:25 AM 10/12/2021    1:03 PM  BP/Weight  Systolic BP 583 094 076 808 811 031 594  Diastolic BP 86 90 74 95 84 68 86  Wt. (Lbs) 195 194 193.2 193 194 192   BMI 31.47 kg/m2 31.31 kg/m2 31.18 kg/m2 31.15 kg/m2 31.31 kg/m2 30.99 kg/m2       Latest Ref Rng & Units 02/26/2022   12:00 AM  Foot/eye exam completion dates  Eye Exam No Retinopathy No Retinopathy          This result is from an external source.      Updated lab needed at/ before next visit.   Essential hypertension Uncontrolled, out of clonidine , same refiled DASH diet and commitment to daily physical activity for a minimum of 30 minutes discussed and encouraged, as a part of hypertension management. The importance of attaining a healthy weight is also discussed.     03/21/2022   11:02 AM 03/12/2022   11:18 AM 02/05/2022    8:38 AM 01/08/2022    2:46 PM 12/06/2021    9:14 AM 11/07/2021   10:25 AM 10/12/2021    1:03 PM  BP/Weight  Systolic BP 585 929 244 628 638 177 116  Diastolic BP 86 90 74 95 84 68 86  Wt. (Lbs) 195 194 193.2 193 194 192   BMI 31.47 kg/m2 31.31 kg/m2 31.18 kg/m2 31.15 kg/m2 31.31 kg/m2 30.99 kg/m2

## 2022-03-21 NOTE — Assessment & Plan Note (Signed)

## 2022-03-22 ENCOUNTER — Encounter (HOSPITAL_BASED_OUTPATIENT_CLINIC_OR_DEPARTMENT_OTHER): Payer: Medicare HMO | Admitting: Physical Medicine and Rehabilitation

## 2022-03-22 DIAGNOSIS — M5416 Radiculopathy, lumbar region: Secondary | ICD-10-CM | POA: Diagnosis not present

## 2022-03-22 LAB — CMP14+EGFR
ALT: 52 IU/L — ABNORMAL HIGH (ref 0–32)
AST: 40 IU/L (ref 0–40)
Albumin/Globulin Ratio: 1.6 (ref 1.2–2.2)
Albumin: 4.3 g/dL (ref 3.8–4.9)
Alkaline Phosphatase: 86 IU/L (ref 44–121)
BUN/Creatinine Ratio: 15 (ref 9–23)
BUN: 16 mg/dL (ref 6–24)
Bilirubin Total: 0.2 mg/dL (ref 0.0–1.2)
CO2: 23 mmol/L (ref 20–29)
Calcium: 9.2 mg/dL (ref 8.7–10.2)
Chloride: 103 mmol/L (ref 96–106)
Creatinine, Ser: 1.04 mg/dL — ABNORMAL HIGH (ref 0.57–1.00)
Globulin, Total: 2.7 g/dL (ref 1.5–4.5)
Glucose: 105 mg/dL — ABNORMAL HIGH (ref 70–99)
Potassium: 3.9 mmol/L (ref 3.5–5.2)
Sodium: 140 mmol/L (ref 134–144)
Total Protein: 7 g/dL (ref 6.0–8.5)
eGFR: 64 mL/min/{1.73_m2} (ref >59)

## 2022-03-22 LAB — LIPID PANEL
Chol/HDL Ratio: 3.4 ratio (ref 0.0–4.4)
Cholesterol, Total: 172 mg/dL (ref 100–199)
HDL: 50 mg/dL (ref >39)
LDL Chol Calc (NIH): 94 mg/dL (ref 0–99)
Triglycerides: 165 mg/dL — ABNORMAL HIGH (ref 0–149)
VLDL Cholesterol Cal: 28 mg/dL (ref 5–40)

## 2022-03-22 LAB — CBC
Hematocrit: 37.7 % (ref 34.0–46.6)
Hemoglobin: 13.1 g/dL (ref 11.1–15.9)
MCH: 30.9 pg (ref 26.6–33.0)
MCHC: 34.7 g/dL (ref 31.5–35.7)
MCV: 89 fL (ref 79–97)
Platelets: 275 10*3/uL (ref 150–450)
RBC: 4.24 x10E6/uL (ref 3.77–5.28)
RDW: 12.6 % (ref 11.7–15.4)
WBC: 5.9 10*3/uL (ref 3.4–10.8)

## 2022-03-22 LAB — HEMOGLOBIN A1C
Est. average glucose Bld gHb Est-mCnc: 146 mg/dL
Hgb A1c MFr Bld: 6.7 % — ABNORMAL HIGH (ref 4.8–5.6)

## 2022-03-22 LAB — TSH: TSH: 1.43 u[IU]/mL (ref 0.450–4.500)

## 2022-03-22 LAB — VITAMIN D 25 HYDROXY (VIT D DEFICIENCY, FRACTURES): Vit D, 25-Hydroxy: 16.2 ng/mL — ABNORMAL LOW (ref 30.0–100.0)

## 2022-03-22 NOTE — Progress Notes (Signed)
Subjective:    Patient ID: Jane English, female    DOB: 1971/02/24, 51 y.o.   MRN: [id number]  HPI:  An audio/video tele-health visit is felt to be the most appropriate encounter for this patient at this time. This is a follow up tele-visit via phone. The patient is at home. MD is at office. Prior to scheduling this appointment, our staff discussed the limitations of evaluation and management by telemedicine and the availability of in-person appointments. The patient expressed understanding and agreed to proceed.   Jane English is a 51 y.o. female who returns for follow-up appointment for chronic pain in her back secondary to failed back syndrome, bilateral knees secondary to chonrdomalacia of bilateral knees, pain in bilateral hands secondary to osteoarthritis, left sided lower back pain  1) Failed back syndrome -Her current exercise regimen is walking and performing stretching exercises. -she is reporting decreased sensation in left lower extremity compared to right -pain is still severe despite starting meloxicam and robaxin -unable to get MRI due to her spinal cord stimulatpr -she is willing to get CT -Last UDS was Performed on 01/26/2021, it was consistent.  -She has been taking oxycodone PRN (30MME) for years- it is helpful for her pain -she has had negative responses with Gabapentin and Lyrica.  -pain has been really severe recently.  -she has never tried Cymbalta.  -she has been experiencing more left sided low back pain since she has started working at [employer] -no more falls.  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 -she asks if a lady reached out to me about the stimulator machine, she would like to get her spinal cord stimulator removed. She does not want to get this removed where it was originally placed, but would rather get it removed at [facility]  2) Chondromalacia of  bilateral knees  3) Anxiety -she receives Alprazolam from Dr. Moshe English, PCP- dose was recently reduced in half.  Jane English has discussed the black box warning of using opioids and benzodiazepines and highlighted the dangers of using these drugs together and discussed the adverse events including respiratory suppression, overdose, cognitive impairment and importance of compliance with current regimen.  -she has been referred to therapy by her PCP.   4) Osteoarthritis of bilateral hands  5) Right buttock pain  6) Myofascial pain  7) Chronic constipation: -has followed with GI since last visit  8) Prediabetes -she was advised by GI to discuss Wegovy vs. Ozempic with her PCP for weight loss- will also help with diabetes.  -there is plan for repeat colostomy- she had a bad experience with this last time.   9) Nausea: she does feel some some nausea with her pain medications.   10) Low mood -at times -she is handling at well.  -She loves to meet new people through her work.  -she has started a new piercing job.   11) Left lumbar radiculopathy: - it is currently really bothering her  -it is severe all the time but she is still working through it -she is unable to have an MRI due to the spinal cord stimulator- she would like to get this removed as soon as possible -she is willing to try meloxicam, she has not tried this before -she is willing to tried robaxin, most muscle relaxers make her very sleepy - she has had to take more oxycodone and wanted to  let us know -she purchased an over the counter heating oil but has not tried that -she cannot tolerate gabapentin since it has caused her to sleep walk before -she has had a hard time going to work due to the pain -she has tried amitriptyline in the past but was started on '150mg'$  and this made her feel drunk. She has not tried '10mg'$ .  -pain has been very severe -feels shooting pain throughout left leg -notes weakness -working through the  pain.     Pain Inventory Average Pain 7 Pain Right Now 10 My pain is constant, sharp, burning, dull, stabbing, tingling, and aching  In the last 24 hours, has pain interfered with the following? General activity 9 Relation with others 9 Enjoyment of life 9 What TIME of day is your pain at its worst? Morning and night Sleep (in general) Fair  Pain is worse with: walking, bending, sitting, and standing, some activities Pain improves with: rest, heat/ice, and medication Relief from Meds: 9  Family History  Problem Relation Age of Onset   Physical abuse Mother    Alcohol abuse Mother    Cirrhosis Mother    Lung cancer Father    Stomach cancer Father    Esophageal cancer Father    Alcohol abuse Father    Mental illness Father    Diabetes Sister    Hypertension Sister    Bipolar disorder Sister    Schizophrenia Sister    Diabetes Sister    Drug abuse Sister    HIV Sister    Pneumonia Sister        died as a baby   Alcohol abuse Brother    Hypertension Brother    Kidney disease Brother    Diabetes Brother    Drug abuse Brother    Mental illness Brother    Alcohol abuse Brother    Alcohol abuse Brother    Hypertension Brother    Diabetes Brother    Alcohol abuse Brother    Mental illness Brother        in [place]   Alcohol abuse Brother    Alcohol abuse Brother    Bipolar disorder Brother    Hypertension Brother    Bipolar disorder Brother    Drug abuse Brother    Alcohol abuse Brother    Bipolar disorder Brother    Bipolar disorder Daughter    Bipolar disorder Son    Bipolar disorder Son    ADD / ADHD Neg Hx    Anxiety disorder Neg Hx    Dementia Neg Hx    Depression Neg Hx    OCD Neg Hx    Seizures Neg Hx    Paranoid behavior Neg Hx    Colon cancer Neg Hx    Rectal cancer Neg Hx    Social History   Socioeconomic History   Marital status: Married    Spouse name: Jane English   Number of children: 4   Years of education: 12   Highest education level: Some  college, no degree  Occupational History   Occupation: disabled  Tobacco Use   Smoking status: Never   Smokeless tobacco: Never  Vaping Use   Vaping Use: Never used  Substance and Sexual Activity   Alcohol use: No   Drug use: No   Sexual activity: Not Currently    Partners: Male    Birth control/protection: Surgical    Comment: tubal  Other Topics Concern   Not on file  Social History Narrative  Not on file   Social Determinants of Health   Financial Resource Strain: Low Risk    Difficulty of Paying Living Expenses: Not hard at all  Food Insecurity: No Food Insecurity   Worried About Charity fundraiser in the Last Year: Never true   [place] in the Last Year: Never true  Transportation Needs: No Transportation Needs   Lack of Transportation (Medical): No   Lack of Transportation (Non-Medical): No  Physical Activity: Insufficiently Active   Days of Exercise per Week: 3 days   Minutes of Exercise per Session: 30 min  Stress: No Stress Concern Present   Feeling of Stress : Only a little  Social Connections: Moderately Isolated   Frequency of Communication with Friends and Family: More than three times a week   Frequency of Social Gatherings with Friends and Family: Once a week   Attends Religious Services: Never   Marine scientist or Organizations: No   Attends Music therapist: Never   Marital Status: Married   Past Surgical History:  Procedure Laterality Date   ANTERIOR CERVICAL DECOMP/DISCECTOMY FUSION  07/07/2012   Procedure: ANTERIOR CERVICAL DECOMPRESSION/DISCECTOMY FUSION 2 LEVELS;  Surgeon: Floyce Stakes, MD;  Location: [facility];  Service: Neurosurgery;  Laterality: N/A;  Cervical four-five, five - six  Anterior cervical decompression/diskectomy/fusion/plate   APPENDECTOMY  1986   BOWEL RESECTION N/A 07/29/2013   Procedure: serosal repair;  Surgeon: Adin Hector, MD;  Location: [facility];  Service: General;  Laterality: N/A;    CARPAL TUNNEL RELEASE Bilateral    COLON SURGERY N/A    Phreesia 07/29/2020   LAPAROSCOPY N/A 07/29/2013   Procedure: diagnostic laporoscopy;  Surgeon: Adin Hector, MD;  Location: [facility];  Service: General;  Laterality: N/A;   LAPAROSCOPY N/A 08/16/2013   Procedure: LAPAROSCOPY DIAGNOSTIC/LYSIS OF ADHESIONS;  Surgeon: Adin Hector, MD;  Location: [facility];  Service: General;  Laterality: N/A;   LAPAROTOMY N/A 08/16/2013   Procedure: EXPLORATORY LAPAROTOMY/SMALL BOWEL RESECTION (JEJUNUM);  Surgeon: Adin Hector, MD;  Location: [facility];  Service: General;  Laterality: N/A;   LUMBAR SPINE SURGERY  2010   x 3   LYSIS OF ADHESION  2003   Dr. Irving Shows   LYSIS OF ADHESION N/A 07/29/2013   Procedure: LYSIS OF ADHESION;  Surgeon: Adin Hector, MD;  Location: [facility];  Service: General;  Laterality: N/A;   [place]?   [place], [place]   SPINAL CORD STIMULATOR IMPLANT     SPINE SURGERY N/A    Phreesia 07/29/2020   TRIGGER FINGER RELEASE  2009   right pinkie finger   TUBAL LIGATION  1994   Past Surgical History:  Procedure Laterality Date   ANTERIOR CERVICAL DECOMP/DISCECTOMY FUSION  07/07/2012   Procedure: ANTERIOR CERVICAL DECOMPRESSION/DISCECTOMY FUSION 2 LEVELS;  Surgeon: Floyce Stakes, MD;  Location: [facility];  Service: Neurosurgery;  Laterality: N/A;  Cervical four-five, five - six  Anterior cervical decompression/diskectomy/fusion/plate   APPENDECTOMY  1986   BOWEL RESECTION N/A 07/29/2013   Procedure: serosal repair;  Surgeon: Adin Hector, MD;  Location: [facility];  Service: General;  Laterality: N/A;   CARPAL TUNNEL RELEASE Bilateral    COLON SURGERY N/A    [facility]reesia 07/29/2020   LAPAROSCOPY N/A 07/29/2013   Procedure: diagnostic laporoscopy;  Surgeon: Adin Hector, MD;  [facility]: [facility];  Service: General;  Laterality: N/A;   LAPAROSCOPY N/A  08/16/2013   Procedure: LAPAROSCOPY DIAGNOSTIC/LYSIS OF ADHESIONS;  Surgeon: Adin Hector, MD;  [facility];  Service: General;  Laterality: N/A;   LAPAROTOMY N/A 08/16/2013   Procedure: EXPLORATORY LAPAROTOMY/SMALL BOWEL RESECTION (JEJUNUM);  Surgeon: Adin Hector, MD;  [facility];  Service: General;  Laterality: N/A;   LUMBAR SPINE SURGERY  2010   x 3   LYSIS OF ADHESION  2003   Dr. Irving Shows   LYSIS OF ADHESION N/A 07/29/2013   Procedure: LYSIS OF ADHESION;  Surgeon: Adin Hector, MD;  [facility];  Service: General;  Laterality: N/A;   [place]?   [place], [place]   SPINAL CORD STIMULATOR IMPLANT     SPINE SURGERY N/A    Phreesia 07/29/2020   TRIGGER FINGER RELEASE  2009   right pinkie finger   TUBAL LIGATION  1994   Past Medical History:  Diagnosis Date   Anemia    Asthma    Asthma flare 04/09/2013   Back pain    Bronchitis    Chronic abdominal pain    Chronic constipation    Constipation due to opioid therapy    Depression    Depression, major, single episode, severe ([place]) 10/03/2018   PHQ 9 score of 15   Diabetes mellitus without complication ([place])    Diabetes mellitus, type II ([place])    DVT (deep venous thrombosis) ([place]) 2010   GERD (gastroesophageal reflux disease)    Heart murmur    no cardiologist   Helicobacter pylori gastritis 06/11/2013   Colonoscopy Dr. Hilarie Fredrickson   Hypertension    IBS (irritable bowel syndrome)    Migraine headache    Neuropathy    Obesity    Obsessive-compulsive disorder    PSYCHOTIC D/O W/HALLUCINATIONS CONDS CLASS ELSW 03/04/2010   Qualifier: Diagnosis of  By: Jane Cipro MD, Margaret     PTSD (post-traumatic stress disorder)    SBO (small bowel obstruction) ([place]) 08/09/2013   Seasonal allergies 12/10/2012   Seizures ([place])    Shortness of breath    There were no vitals taken for this visit.  Opioid Risk Score:   Fall Risk Score:  `1  Depression screen [facility] 2/9     03/21/2022   11:02 AM 03/12/2022   11:25 AM 02/05/2022    8:47 AM 12/06/2021    9:18 AM 11/30/2021     9:27 AM 11/08/2021    8:10 AM 11/07/2021   10:31 AM  Depression screen PHQ 2/9  Decreased Interest 0 0 0 0 0 0 0  Down, Depressed, Hopeless 0 0 0 0 0 0 0  PHQ - 2 Score 0 0 0 0 0 0 0    Review of Systems  Constitutional: Negative.   HENT: Negative.    Eyes: Negative.   Respiratory: Negative.    Cardiovascular: Negative.   Gastrointestinal: Negative.   Endocrine: Negative.   Genitourinary: Negative.   Musculoskeletal:  Positive for back pain.       Pain in both knee & both shoulders  Skin: Negative.   Allergic/Immunologic: Negative.   Neurological: Negative.   Hematological: Negative.   Psychiatric/Behavioral:  Positive for dysphoric mood.   All other systems reviewed and are negative.     Objective:  Not performed    Assessment & Plan:  1. Chronic Pain of Bilateral Shoulders L>R: Continue HEP as Tolerated. Continue to Monitor.  2. Failed Back Syndrome of Lumbar Syndrome : Continue HEP as Tolerated.  Continue current medication regimen. Continue to Monitor. Asked for phone number of [employer] rep and I will call her today to discuss removal of her spinal cord stimulator which is no longer functional -continue applying deep blue -refilled percocet.  Prescribed robaxin and meloxicam. Discussed risks and benefits.  -Discussed current symptoms of pain and history of pain.  -Discussed benefits of exercise in reducing pain. -Discussed following foods that may reduce pain: 1) Ginger (especially studied for arthritis)- reduce leukotriene production to decrease inflammation 2) Blueberries- high in phytonutrients that decrease inflammation 3) Salmon- marine omega-3s reduce joint swelling and pain 4) Pumpkin seeds- reduce inflammation 5) dark chocolate- reduces inflammation 6) turmeric- reduces inflammation 7) tart cherries - reduce pain and stiffness 8) extra virgin olive oil - its compound olecanthal helps to block prostaglandins  9) chili peppers- can be eaten or applied topically  via capsaicin 10) mint- helpful for headache, muscle aches, joint pain, and itching 11) garlic- reduces inflammation  Link to further information on diet for chronic pain: [email or web address]  -Provided with a pain relief journal and discussed that it contains foods and lifestyle tips to naturally help to improve pain. Discussed that these lifestyle strategies are also very good for health unlike some medications which can have negative side effects. Discussed that the act of keeping a journal can be therapeutic and helpful to realize patterns what helps to trigger and alleviate pain.  l 3. Chronic Lower Back Pain without Sciatica: Continue HEP as Tolerated. Continue to Monitor. Referred to [facility] therapy -texted her exercises she can do to improve spinal alignment and improve pain -discussed gabapentin but she cannot use this due to history of sleepwalking on this medicine -recommended trying doTerra Deep Blue essential oil -discussed that it is ok for use to provide an early refill of oxycodone this times if she needs due to her increased pain 4. Left Knee Pain: No Complaints today. Continue HEP as Tolerated. Continue to Monitor. Discussed Wobenzymes.  5. Muscle Spasm of Shoulders/ Upper Back : Continue Flexeril. Continue to Monitor. 6. Chronic Pain Syndrome: Refilled: Oxycodone '5mg'$ /325 mg two tablets twice a day #120. We will continue the opioid monitoring program, this consists of regular clinic visits, examinations, urine drug screen, pill counts as well as use of [place] Controlled Substance Reporting system.  7. Constipation:  -Continue Linzess as prescribed by GI -Provided list of following foods that help with constipation and highlighted a few: 1) prunes- contain high amounts of fiber.  2) apples- has a form of dietary fiber called pectin that accelerates stool movement and increases beneficial gut bacteria 3)  pears- in addition to fiber, also high in fructose and sorbitol which have laxative effect 4) figs- contain an enzyme ficin which helps to speed colonic transit 5) kiwis- contain an enzyme actinidin that improves gut motility and reduces constipation 6) oranges- rich in pectin (like apples) 7) grapefruits- contain a flavanol naringenin which has a laxative effect 8) vegetables- rich in fiber and also great sources of folate, vitamin C, and K 9) artichoke- high in inulin, prebiotic great for the microbiome 10) chicory- increases stool frequency and softness (can be added to coffee) 11) rhubarb- laxative effect 12) sweet potato- high fiber 13) beans, peas, and lentils- contain both soluble and insoluble fiber 14) chia seeds- improves intestinal health and gut flora 15) flaxseeds- laxative effect 16) whole grain rye bread- high in fiber 17) oat bran- high in soluble and insoluble fiber 18) kefir- softens stools -recommended to try at least  one of these foods every day.  -drink 6-8 glasses of water per day -walk regularly, especially after meals.   8) Anxiety: -Continue alprazolam as prescribed by Dr. Moshe English. Appreciate Eunice's counseling regarding increased risk of side effects with use of Alprazolam and Oxycodone.  -Discussed exercise and meditation as tools to decrease anxiety. -Recommended Down Dog Yoga app -Discussed spending time outdoors. -Discussed positive re-framing of anxiety.  -Discussed the following foods that have been show to reduce anxiety: 1) [place] nuts, mushrooms, soy beans due to their high selenium content. Upper limit of toxicity of selenium is 4102mg/day so no more than 3-4 bBolivianuts per day.  2) Fatty fish such as salmon, mackerel, sardines, trout, and herring- high in omega-3 fatty acids 3) Eggs- increases serotonin and dopamine 4) Pumpkin seeds- high in omega-3 fatty acids 5) dark chocolate- high in flavanols that increase blood flow to brain 6) turmeric-  take with black pepper to increase absorption 7) chamomile tea- antioxidant and anti-inflammatory properties 8) yogurt without sugar- supports gut-brain axis 9) green tea- contains L- theanine 10) blueberries- high in vitamin C and antioxidants 11) t[place] high in tryptophan which gets converted to serotonin 12) bell peppers- rich in vitamin C and antioxidants 13) citrus fruits- rich in vitamin C and antioxidants 14) almonds- high in vitamin E and healthy fats 15) chia seeds- high in omega-3 fatty acids  9) Prediabetes -HgbA1c reviewed- 6.3 -check CBGs daily, log, and bring log to follow-up appointment -avoid sugar, bread, pasta, rice -avoid snacking -try to incorporate into your diet some of the following foods which are good for diabetes: 1) cinnamon- imitates effects of insulin, increasing glucose transport into cells (CWestern Saharaor VGuinea-Bissaucinnamon is best, least processed) 2) nuts- can slow down the blood sugar response of carbohydrate rich foods 3) oatmeal- contains and anti-inflammatory compound avenanthramide 4) whole-milk yogurt (best types are no sugar, GMayotteyogurt, or goat/sheep yogurt) 5) beans- high in protein, fiber, and vitamins, low glycemic index 6) broccoli- great source of vitamin A and C 7) quinoa- higher in protein and fiber than other grains 8) spinach- high in vitamin A, fiber, and protein 9) olive oil- reduces glucose levels, LDL, and triglycerides 10) salmon- excellent amount of omega-3-fatty acids 11) walnuts- rich in antioxidants 12) apples- high in fiber and quercetin 13) carrots- highly nutritious with low impact on blood sugar 14) eggs- improve HDL (good cholesterol), high in protein, keep you satiated 15) turmeric: improves blood sugars, cardiovascular disease, and protects kidney health 16) garlic: improves blood sugar, blood pressure, pain 17) tomatoes: highly nutritious with low impact on blood sugar   10) Obesity: -Educated regarding health  benefits of weight loss- for pain, general health, chronic disease prevention, immune health, mental health.  -prescribed magnesium gluconate '250mg'$  HS -Will monitor weight every visit.  -Consider [place] tea daily.  -Discussed the benefits of intermittent fasting. -Discussed foods that can assist in weight loss: 1) leafy greens- high in fiber and nutrients 2) dark chocolate- improves metabolism (if prefer sweetened, best to sweeten with honey instead of sugar).  3) cruciferous vegetables- high in fiber and protein 4) full fat yogurt: high in healthy fat, protein, calcium, and probiotics 5) apples- high in a variety of phytochemicals 6) nuts- high in fiber and protein that increase feelings of fullness 7) grapefruit: rich in nutrients, antioxidants, and fiber (not to be taken with anticoagulation) 8) beans- high in protein and fiber 9) salmon- has high quality protein and healthy fats 10) green tea- rich in polyphenols  11) eggs- rich in choline and vitamin D 12) tuna- high protein, boosts metabolism 13) avocado- decreases visceral abdominal fat 14) chicken (pasture raised): high in protein and iron 15) blueberries- reduce abdominal fat and cholesterol 16) whole grains- decreases calories retained during digestion, speeds metabolism 17) chia seeds- English appetite 18) chilies- increases fat metabolism  -Discussed supplements that can be used:  1) Metatrim '400mg'$  BID 30 minutes before breakfast and dinner  2) Sphaeranthus indicus and Garcinia mangostana (combinations of these and #1 can be found in capsicum and zychrome  3) green coffee bean extract '400mg'$  twice per day or Irvingia (african mango) 150 to '300mg'$  twice per day.  11) Left lumbar radiculopathy: -MRI ordered of lumbar spine, discussed suspected diagnosis of lumbar radiculopathy given lower back pain radiating into lower extremity. Unfortunately, looks like she will be unable to get MRI due to her spinal cord stimulator. Referred to  [facility] for removal of spinal cord stimulator. CT lumbar spine ordered [facility] given new neurological deficits of decreased sensation. Discussed going to ED if neurological symptoms worsen -continue amitriptyline '10mg'$  HS, discussed that this could potentially help her sleep better at night without having to take a percocet at this time -continue meloxicam and robaxin- discussed the mechanism of action of each.   5 minutes spent in discussion of her continued spina pain despite meloxicam and robaxin, new decreased sensation on left side, ordering stat CT given neurological symptoms and inability to get MRI

## 2022-03-26 ENCOUNTER — Other Ambulatory Visit: Payer: Self-pay | Admitting: Family Medicine

## 2022-03-26 LAB — CYTOLOGY - PAP
Comment: NEGATIVE
Comment: NEGATIVE
Comment: NEGATIVE
Diagnosis: NEGATIVE
HPV 16: NEGATIVE
HPV 18 / 45: NEGATIVE
High risk HPV: POSITIVE — AB

## 2022-03-26 MED ORDER — TIRZEPATIDE 2.5 MG/0.5ML ~~LOC~~ SOAJ: 2.5000 mg | SUBCUTANEOUS | 0 refills | Status: DC

## 2022-03-26 MED ORDER — TIRZEPATIDE 5 MG/0.5ML ~~LOC~~ SOAJ: 5.0000 mg | SUBCUTANEOUS | 0 refills | Status: DC

## 2022-03-26 MED ORDER — ERGOCALCIFEROL 1.25 MG (50000 UT) PO CAPS: 50000.0000 [IU] | ORAL_CAPSULE | ORAL | 2 refills | Status: DC

## 2022-03-27 ENCOUNTER — Telehealth: Payer: Self-pay

## 2022-03-27 ENCOUNTER — Telehealth: Payer: Self-pay | Admitting: Family Medicine

## 2022-03-27 NOTE — Telephone Encounter (Signed)
Patient asked if nurse will please give her a call to explain her labs to her.

## 2022-03-27 NOTE — Telephone Encounter (Signed)
Spoke with patient and answered all questions.

## 2022-03-27 NOTE — Telephone Encounter (Signed)
Patient called in requesting a call back, did not specify

## 2022-03-27 NOTE — Telephone Encounter (Signed)
Patient aware and voiced understanding of results

## 2022-03-28 ENCOUNTER — Ambulatory Visit (HOSPITAL_COMMUNITY)
Admission: RE | Admit: 2022-03-28 | Discharge: 2022-03-28 | Disposition: A | Discharge status: Home / Self Care | Payer: Medicare HMO | Source: Ambulatory Visit | Attending: Family Medicine | Admitting: Family Medicine

## 2022-03-28 DIAGNOSIS — Z1231 Encounter for screening mammogram for malignant neoplasm of breast: Secondary | ICD-10-CM | POA: Insufficient documentation

## 2022-04-03 ENCOUNTER — Other Ambulatory Visit: Payer: Self-pay | Admitting: Physical Medicine and Rehabilitation

## 2022-04-03 ENCOUNTER — Telehealth: Payer: Self-pay

## 2022-04-03 ENCOUNTER — Ambulatory Visit
Admission: RE | Admit: 2022-04-03 | Discharge: 2022-04-03 | Disposition: A | Discharge status: Home / Self Care | Payer: Medicare HMO | Source: Ambulatory Visit | Attending: Physical Medicine and Rehabilitation | Admitting: Physical Medicine and Rehabilitation

## 2022-04-03 DIAGNOSIS — M5416 Radiculopathy, lumbar region: Secondary | ICD-10-CM

## 2022-04-03 DIAGNOSIS — M545 Low back pain, unspecified: Secondary | ICD-10-CM | POA: Diagnosis not present

## 2022-04-03 NOTE — Telephone Encounter (Signed)
Wanted to know if mounjaro was supposed to make you feel tired. I told her that was a common side effect that should get better- she is due to take her 2nd shot on Friday so advised her if it didn't get better or she felt worse and wanted to stop taking it to give [place] a call

## 2022-04-03 NOTE — Telephone Encounter (Signed)
Patient called asked for nurse to return call about Monujaro 2.'5mg'$  shot [phone]

## 2022-04-04 ENCOUNTER — Encounter: Payer: Medicare HMO | Admitting: Registered Nurse

## 2022-04-04 ENCOUNTER — Encounter: Payer: Medicare HMO | Attending: Physical Medicine and Rehabilitation | Admitting: Physical Medicine and Rehabilitation

## 2022-04-04 DIAGNOSIS — M961 Postlaminectomy syndrome, not elsewhere classified: Secondary | ICD-10-CM | POA: Diagnosis not present

## 2022-04-04 DIAGNOSIS — R112 Nausea with vomiting, unspecified: Secondary | ICD-10-CM | POA: Insufficient documentation

## 2022-04-04 DIAGNOSIS — M5416 Radiculopathy, lumbar region: Secondary | ICD-10-CM | POA: Diagnosis not present

## 2022-04-04 MED ORDER — OXYCODONE HCL 5 MG PO TABS: 10.0000 mg | ORAL_TABLET | Freq: Two times a day (BID) | ORAL | 0 refills | Status: DC

## 2022-04-04 NOTE — Progress Notes (Signed)
Subjective:    Patient ID: Jane English, female    DOB: 1971-01-19, 52 y.o.   MRN: [id number]  HPI:  An audio/video tele-health visit is felt to be the most appropriate encounter for this patient at this time. This is a follow up tele-visit via phone. The patient is at home. MD is at office. Prior to scheduling this appointment, our staff discussed the limitations of evaluation and management by telemedicine and the availability of in-person appointments. The patient expressed understanding and agreed to proceed.   Jane English is a 51 y.o. female who returns for follow-up appointment for chronic pain in her back secondary to failed back syndrome, bilateral knees secondary to chonrdomalacia of bilateral knees, pain in bilateral hands secondary to osteoarthritis, left sided lower back pain  1) Failed back syndrome -Her current exercise regimen is walking and performing stretching exercises. -she is reporting decreased sensation in left lower extremity compared to right -pain is still severe despite starting meloxicam and robaxin -unable to get MRI due to her spinal cord stimulatpr -she is willing to get CT -she asks when she get her spinal cord stimulator removed. She does not want to go back to her old Psychologist, sport and exercise -Last UDS was Performed on 01/26/2021, it was consistent.  -She has been taking oxycodone PRN (30MME) for years- it is helpful for her pain -she has had negative responses with Gabapentin and Lyrica.  -pain has been really severe recently.  -she has never tried Cymbalta.  -she has been experiencing more left sided low back pain since she has started working at [employer] -no more falls.  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 -she asks if a lady reached out to me about the stimulator machine, she would like to get her spinal cord stimulator removed. She does not want to get this removed  where it was originally placed, but would rather get it removed at [place]  2) Chondromalacia of bilateral knees  3) Anxiety -she receives Alprazolam from Dr. Moshe Cipro, PCP- dose was recently reduced in half.  [name] has discussed the black box warning of using opioids and benzodiazepines and highlighted the dangers of using these drugs together and discussed the adverse events including respiratory suppression, overdose, cognitive impairment and importance of compliance with current regimen.  -she has been referred to therapy by her PCP.   4) Osteoarthritis of bilateral hands  5) Right buttock pain  6) Myofascial pain  7) Chronic constipation: -has followed with GI since last visit  8) Prediabetes -she was advised by GI to discuss Wegovy vs. Ozempic with her PCP for weight loss- will also help with diabetes.  -there is plan for repeat colostomy- she had a bad experience with this last time.   9) Nausea: she does feel some some nausea with her pain medications.   10) Low mood -at times -she is handling at well.  -She loves to meet new people through her work.  -she has started a new piercing job.   11) Left lumbar radiculopathy: - it is currently really bothering her  -the meloxicam and muscle relaxers help temporarily but not for long -it is severe all the time but she is still working through it -she is unable to have an MRI due to the spinal cord stimulator- she would like to get this removed as soon as possible - she is due  for oxycodone refill today.  -she purchased an over the counter heating oil but has not tried that -she cannot tolerate gabapentin since it has caused her to sleep walk before -she has had a hard time going to work due to the pain -she has tried amitriptyline in the past but was started on '150mg'$  and this made her feel drunk. She has not tried '10mg'$ .  -pain has been very severe -feels shooting pain throughout left leg -notes weakness -working  through the pain.  -she is more interested in surgery than injections as she has not had benefit from injections before.    Pain Inventory Average Pain 7 Pain Right Now 10 My pain is constant, sharp, burning, dull, stabbing, tingling, and aching  In the last 24 hours, has pain interfered with the following? General activity 9 Relation with others 9 Enjoyment of life 9 What TIME of day is your pain at its worst? Morning and night Sleep (in general) Fair  Pain is worse with: walking, bending, sitting, and standing, some activities Pain improves with: rest, heat/ice, and medication Relief from Meds: 9  Family History  Problem Relation Age of Onset   Physical abuse Mother    Alcohol abuse Mother    Cirrhosis Mother    Lung cancer Father    Stomach cancer Father    Esophageal cancer Father    Alcohol abuse Father    Mental illness Father    Diabetes Sister    Hypertension Sister    Bipolar disorder Sister    Schizophrenia Sister    Diabetes Sister    Drug abuse Sister    HIV Sister    Pneumonia Sister        died as a baby   Alcohol abuse Brother    Hypertension Brother    Kidney disease Brother    Diabetes Brother    Drug abuse Brother    Mental illness Brother    Alcohol abuse Brother    Alcohol abuse Brother    Hypertension Brother    Diabetes Brother    Alcohol abuse Brother    Mental illness Brother        in [place]   Alcohol abuse Brother    Alcohol abuse Brother    Bipolar disorder Brother    Hypertension Brother    Bipolar disorder Brother    Drug abuse Brother    Alcohol abuse Brother    Bipolar disorder Brother    Bipolar disorder Daughter    Bipolar disorder Son    Bipolar disorder Son    ADD / ADHD Neg Hx    Anxiety disorder Neg Hx    Dementia Neg Hx    Depression Neg Hx    OCD Neg Hx    Seizures Neg Hx    Paranoid behavior Neg Hx    Colon cancer Neg Hx    Rectal cancer Neg Hx    Social History   Socioeconomic History   Marital status:  Married    Spouse name: Herbie [place]   Number of children: 4   Years of education: 12   Highest education level: Some college, no degree  Occupational History   Occupation: disabled  Tobacco Use   Smoking status: Never   Smokeless tobacco: Never  Vaping Use   Vaping Use: Never used  Substance and Sexual Activity   Alcohol use: No   Drug use: No   Sexual activity: Not Currently    Partners: Male    Birth control/protection:  Surgical    Comment: tubal  Other Topics Concern   Not on file  Social History Narrative   Not on file   Social Determinants of Health   Financial Resource Strain: Low Risk    Difficulty of Paying Living Expenses: Not hard at all  Food Insecurity: No Food Insecurity   Worried About Running Out of Food in the Last Year: Never true   [place] in the Last Year: Never true  Transportation Needs: No Transportation Needs   Lack of Transportation (Medical): No   Lack of Transportation (Non-Medical): No  Physical Activity: Insufficiently Active   Days of Exercise per Week: 3 days   Minutes of Exercise per Session: 30 min  Stress: No Stress Concern Present   Feeling of Stress : Only a little  Social Connections: Moderately Isolated   Frequency of Communication with Friends and Family: More than three times a week   Frequency of Social Gatherings with Friends and Family: Once a week   Attends Religious Services: Never   Marine scientist or Organizations: No   Attends Music therapist: Never   Marital Status: Married   Past Surgical History:  Procedure Laterality Date   ANTERIOR CERVICAL DECOMP/DISCECTOMY FUSION  07/07/2012   Procedure: ANTERIOR CERVICAL DECOMPRESSION/DISCECTOMY FUSION 2 LEVELS;  Surgeon: Floyce Stakes, MD;  Location: [facility];  Service: Neurosurgery;  Laterality: N/A;  Cervical four-five, five - six  Anterior cervical decompression/diskectomy/fusion/plate   APPENDECTOMY  1986   BOWEL RESECTION N/A 07/29/2013    Procedure: serosal repair;  Surgeon: Adin Hector, MD;  Location: [facility];  Service: General;  Laterality: N/A;   CARPAL TUNNEL RELEASE Bilateral    COLON SURGERY N/A    Phreesia 07/29/2020   LAPAROSCOPY N/A 07/29/2013   Procedure: diagnostic laporoscopy;  Surgeon: Adin Hector, MD;  Location: [facility];  Service: General;  Laterality: N/A;   LAPAROSCOPY N/A 08/16/2013   Procedure: LAPAROSCOPY DIAGNOSTIC/LYSIS OF ADHESIONS;  Surgeon: Adin Hector, MD;  Location: [facility];  Service: General;  Laterality: N/A;   LAPAROTOMY N/A 08/16/2013   Procedure: EXPLORATORY LAPAROTOMY/SMALL BOWEL RESECTION (JEJUNUM);  Surgeon: Adin Hector, MD;  Location: [facility];  Service: General;  Laterality: N/A;   LUMBAR SPINE SURGERY  2010   x 3   LYSIS OF ADHESION  2003   Dr. Irving Shows   LYSIS OF ADHESION N/A 07/29/2013   Procedure: LYSIS OF ADHESION;  Surgeon: Adin Hector, MD;  Location: [facility];  Service: General;  Laterality: N/A;   [place]?   [place], [place]   SPINAL CORD STIMULATOR IMPLANT     SPINE SURGERY N/A    Phreesia 07/29/2020   TRIGGER FINGER RELEASE  2009   right pinkie finger   TUBAL LIGATION  1994   Past Surgical History:  Procedure Laterality Date   ANTERIOR CERVICAL DECOMP/DISCECTOMY FUSION  07/07/2012   Procedure: ANTERIOR CERVICAL DECOMPRESSION/DISCECTOMY FUSION 2 LEVELS;  Surgeon: Floyce Stakes, MD;  Location: [facility];  Service: Neurosurgery;  Laterality: N/A;  Cervical four-five, five - six  Anterior cervical decompression/diskectomy/fusion/plate   APPENDECTOMY  1986   BOWEL RESECTION N/A 07/29/2013   Procedure: serosal repair;  Surgeon: Adin Hector, MD;  Location: [facility];  Service: General;  Laterality: N/A;   CARPAL TUNNEL RELEASE Bilateral    COLON SURGERY N/A    Phreesia 07/29/2020   LAPAROSCOPY N/A 07/29/2013   Procedure: diagnostic  laporoscopy;  Surgeon: Adin Hector, MD;  [facility];  Service: General;  Laterality:  N/A;   LAPAROSCOPY N/A 08/16/2013   Procedure: LAPAROSCOPY DIAGNOSTIC/LYSIS OF ADHESIONS;  Surgeon: Adin Hector, MD;  [facility];  Service: General;  Laterality: N/A;   LAPAROTOMY N/A 08/16/2013   Procedure: EXPLORATORY LAPAROTOMY/SMALL BOWEL RESECTION (JEJUNUM);  Surgeon: Adin Hector, MD;  [facility];  Service: General;  Laterality: N/A;   LUMBAR SPINE SURGERY  2010   x 3   LYSIS OF ADHESION  2003   Dr. Irving Shows   LYSIS OF ADHESION N/A 07/29/2013   Procedure: LYSIS OF ADHESION;  Surgeon: Adin Hector, MD;  [facility];  Service: General;  Laterality: N/A;   [place]?   [place], [place]   SPINAL CORD STIMULATOR IMPLANT     SPINE SURGERY N/A    Phreesia 07/29/2020   TRIGGER FINGER RELEASE  2009   right pinkie finger   TUBAL LIGATION  1994   Past Medical History:  Diagnosis Date   Anemia    Asthma    Asthma flare 04/09/2013   Back pain    Bronchitis    Chronic abdominal pain    Chronic constipation    Constipation due to opioid therapy    Depression    Depression, major, single episode, severe ([place]) 10/03/2018   PHQ 9 score of 15   Diabetes mellitus without complication ([place])    Diabetes mellitus, type II ([place])    DVT (deep venous thrombosis) ([place]) 2010   GERD (gastroesophageal reflux disease)    Heart murmur    no cardiologist   Helicobacter pylori gastritis 06/11/2013   Colonoscopy Dr. Hilarie Fredrickson   Hypertension    IBS (irritable bowel syndrome)    Migraine headache    Neuropathy    Obesity    Obsessive-compulsive disorder    PSYCHOTIC D/O W/HALLUCINATIONS CONDS CLASS ELSW 03/04/2010   Qualifier: Diagnosis of  By: Moshe Cipro MD, Margaret     PTSD (post-traumatic stress disorder)    SBO (small bowel obstruction) ([place]) 08/09/2013   Seasonal allergies 12/10/2012   Seizures ([place])    Shortness of breath    There were no vitals taken for this visit.  Opioid Risk Score:   Fall Risk Score:  `1  Depression screen [facility]  2/9     03/21/2022   11:02 AM 03/12/2022   11:25 AM 02/05/2022    8:47 AM 12/06/2021    9:18 AM 11/30/2021    9:27 AM 11/08/2021    8:10 AM 11/07/2021   10:31 AM  Depression screen PHQ 2/9  Decreased Interest 0 0 0 0 0 0 0  Down, Depressed, Hopeless 0 0 0 0 0 0 0  PHQ - 2 Score 0 0 0 0 0 0 0    Review of Systems  Constitutional: Negative.   HENT: Negative.    Eyes: Negative.   Respiratory: Negative.    Cardiovascular: Negative.   Gastrointestinal: Negative.   Endocrine: Negative.   Genitourinary: Negative.   Musculoskeletal:  Positive for back pain.       Pain in both knee & both shoulders  Skin: Negative.   Allergic/Immunologic: Negative.   Neurological: Negative.   Hematological: Negative.   Psychiatric/Behavioral:  Positive for dysphoric mood.   All other systems reviewed and are negative.     Objective:  Not performed    Assessment & Plan:  1. Chronic Pain of Bilateral Shoulders  L>R: Continue HEP as Tolerated. Continue to Monitor.  2. Failed Back Syndrome of Lumbar Syndrome : Continue HEP as Tolerated. Continue current medication regimen. Continue to Monitor. Asked for phone number of [employer] rep and I will call her today to discuss removal of her spinal cord stimulator which is no longer functional -continue applying deep blue -refilled percocet.  Prescribed robaxin and meloxicam. Discussed risks and benefits.  -Discussed current symptoms of pain and history of pain.  -Discussed benefits of exercise in reducing pain. -Discussed following foods that may reduce pain: 1) Ginger (especially studied for arthritis)- reduce leukotriene production to decrease inflammation 2) Blueberries- high in phytonutrients that decrease inflammation 3) Salmon- marine omega-3s reduce joint swelling and pain 4) Pumpkin seeds- reduce inflammation 5) dark chocolate- reduces inflammation 6) turmeric- reduces inflammation 7) tart cherries - reduce pain and stiffness 8) extra virgin olive oil -  its compound olecanthal helps to block prostaglandins  9) chili peppers- can be eaten or applied topically via capsaicin 10) mint- helpful for headache, muscle aches, joint pain, and itching 11) garlic- reduces inflammation  Link to further information on diet for chronic pain: [email or web address]  -Provided with a pain relief journal and discussed that it contains foods and lifestyle tips to naturally help to improve pain. Discussed that these lifestyle strategies are also very good for health unlike some medications which can have negative side effects. Discussed that the act of keeping a journal can be therapeutic and helpful to realize patterns what helps to trigger and alleviate pain.  l 3. Chronic Lower Back Pain without Sciatica: Continue HEP as Tolerated. Continue to Monitor. Referred to [facility] therapy -texted her exercises she can do to improve spinal alignment and improve pain -discussed gabapentin but she cannot use this due to history of sleepwalking on this medicine -recommended trying doTerra Deep Blue essential oil -discussed that it is ok for use to provide an early refill of oxycodone this times if she needs due to her increased pain 4. Left Knee Pain: No Complaints today. Continue HEP as Tolerated. Continue to Monitor. Discussed Wobenzymes.  5. Muscle Spasm of Shoulders/ Upper Back : Continue Flexeril. Continue to Monitor. 6. Chronic Pain Syndrome: Refilled: Oxycodone '5mg'$ /325 mg two tablets twice a day #120. We will continue the opioid monitoring program, this consists of regular clinic visits, examinations, urine drug screen, pill counts as well as use of [place] Controlled Substance Reporting system.  7. Constipation:  -Continue Linzess as prescribed by GI -Provided list of following foods that help with constipation and highlighted a few: 1) prunes- contain high amounts of fiber.  2) apples- has a  form of dietary fiber called pectin that accelerates stool movement and increases beneficial gut bacteria 3) pears- in addition to fiber, also high in fructose and sorbitol which have laxative effect 4) figs- contain an enzyme ficin which helps to speed colonic transit 5) kiwis- contain an enzyme actinidin that improves gut motility and reduces constipation 6) oranges- rich in pectin (like apples) 7) grapefruits- contain a flavanol naringenin which has a laxative effect 8) vegetables- rich in fiber and also great sources of folate, vitamin C, and K 9) artichoke- high in inulin, prebiotic great for the microbiome 10) chicory- increases stool frequency and softness (can be added to coffee) 11) rhubarb- laxative effect 12) sweet potato- high fiber 13) beans, peas, and lentils- contain both soluble and insoluble fiber 14) chia seeds- improves intestinal health and gut flora 15) flaxseeds- laxative effect 16) whole grain rye bread-  high in fiber 17) oat bran- high in soluble and insoluble fiber 18) kefir- softens stools -recommended to try at least one of these foods every day.  -drink 6-8 glasses of water per day -walk regularly, especially after meals.   8) Anxiety: -Continue alprazolam as prescribed by Dr. Moshe Cipro. Appreciate Eunice's counseling regarding increased risk of side effects with use of Alprazolam and Oxycodone.  -Discussed exercise and meditation as tools to decrease anxiety. -Recommended Down Dog Yoga app -Discussed spending time outdoors. -Discussed positive re-framing of anxiety.  -Discussed the following foods that have been show to reduce anxiety: 1) [place] nuts, mushrooms, soy beans due to their high selenium content. Upper limit of toxicity of selenium is 49mg/day so no more than 3-4 bBolivianuts per day.  2) Fatty fish such as salmon, mackerel, sardines, trout, and herring- high in omega-3 fatty acids 3) Eggs- increases serotonin and dopamine 4) Pumpkin seeds- high  in omega-3 fatty acids 5) dark chocolate- high in flavanols that increase blood flow to brain 6) turmeric- take with black pepper to increase absorption 7) chamomile tea- antioxidant and anti-inflammatory properties 8) yogurt without sugar- supports gut-brain axis 9) green tea- contains L- theanine 10) blueberries- high in vitamin C and antioxidants 11) t[place] high in tryptophan which gets converted to serotonin 12) bell peppers- rich in vitamin C and antioxidants 13) citrus fruits- rich in vitamin C and antioxidants 14) almonds- high in vitamin E and healthy fats 15) chia seeds- high in omega-3 fatty acids  9) Diabetes type 2 -HgbA1c reviewed- 6.7 -continue Mounjaro, discussed that nausea is a common side effect and usually shortlived.  -check CBGs daily, log, and bring log to follow-up appointment -avoid sugar, bread, pasta, rice -avoid snacking -try to incorporate into your diet some of the following foods which are good for diabetes: 1) cinnamon- imitates effects of insulin, increasing glucose transport into cells (CWestern Saharaor VGuinea-Bissaucinnamon is best, least processed) 2) nuts- can slow down the blood sugar response of carbohydrate rich foods 3) oatmeal- contains and anti-inflammatory compound avenanthramide 4) whole-milk yogurt (best types are no sugar, GMayotteyogurt, or goat/sheep yogurt) 5) beans- high in protein, fiber, and vitamins, low glycemic index 6) broccoli- great source of vitamin A and C 7) quinoa- higher in protein and fiber than other grains 8) spinach- high in vitamin A, fiber, and protein 9) olive oil- reduces glucose levels, LDL, and triglycerides 10) salmon- excellent amount of omega-3-fatty acids 11) walnuts- rich in antioxidants 12) apples- high in fiber and quercetin 13) carrots- highly nutritious with low impact on blood sugar 14) eggs- improve HDL (good cholesterol), high in protein, keep you satiated 15) turmeric: improves blood sugars, cardiovascular  disease, and protects kidney health 16) garlic: improves blood sugar, blood pressure, pain 17) tomatoes: highly nutritious with low impact on blood sugar   10) Obesity: -Educated regarding health benefits of weight loss- for pain, general health, chronic disease prevention, immune health, mental health.  -prescribed magnesium gluconate '250mg'$  HS -Will monitor weight every visit.  -Consider Ro[place]ois tea daily.  -Discussed the benefits of intermittent fasting. -Discussed foods that can assist in weight loss: 1) leafy greens- high in fiber and nutrients 2) dark chocolate- improves metabolism (if prefer sweetened, best to sweeten with honey instead of sugar).  3) cruciferous vegetables- high in fiber and protein 4) full fat yogurt: high in healthy fat, protein, calcium, and probiotics 5) apples- high in a variety of phytochemicals 6) nuts- high in fiber and protein that increase feelings of  fullness 7) grapefruit: rich in nutrients, antioxidants, and fiber (not to be taken with anticoagulation) 8) beans- high in protein and fiber 9) salmon- has high quality protein and healthy fats 10) green tea- rich in polyphenols 11) eggs- rich in choline and vitamin D 12) tuna- high protein, boosts metabolism 13) avocado- decreases visceral abdominal fat 14) chicken (pasture raised): high in protein and iron 15) blueberries- reduce abdominal fat and cholesterol 16) whole grains- decreases calories retained during digestion, speeds metabolism 17) chia seeds- English appetite 18) chilies- increases fat metabolism  -Discussed supplements that can be used:  1) Metatrim '400mg'$  BID 30 minutes before breakfast and dinner  2) Sphaeranthus indicus and Garcinia mangostana (combinations of these and #1 can be found in capsicum and zychrome  3) green coffee bean extract '400mg'$  twice per day or Irvingia (african mango) 150 to '300mg'$  twice per day.  11) Left lumbar radiculopathy: -MRI ordered of lumbar spine, discussed  suspected diagnosis of lumbar radiculopathy given lower back pain radiating into lower extremity. Unfortunately, looks like she will be unable to get MRI due to her spinal cord stimulator. Referred to [place] for removal of spinal cord stimulator. CT lumbar spine ordered [facility] given new neurological deficits of decreased sensation. Discussed going to ED if neurological symptoms worsen -continue amitriptyline '10mg'$  HS, discussed that this could potentially help her sleep better at night without having to take a percocet at this time -discontinued meloxicam since not helping much and has several potential side effects. -reviewed the results of her CT lumbar spine.  Discussed that there are no significant concerning findings.   12 minutes spent in discussion of her pain, response to meloxicam and robaxin, discussing the results of her CT lumbar spine, discussing that she has not yet heard from [place], that I will follow-up with [facility] today to see about referral for surgical eval/spinal cord stimulator removal

## 2022-04-05 ENCOUNTER — Ambulatory Visit: Payer: Medicare HMO | Admitting: Physical Medicine and Rehabilitation

## 2022-04-09 ENCOUNTER — Telehealth: Payer: Self-pay | Admitting: Family Medicine

## 2022-04-09 ENCOUNTER — Other Ambulatory Visit: Payer: Self-pay

## 2022-04-09 MED ORDER — BLOOD GLUCOSE METER KIT: PACK | 0 refills | Status: DC

## 2022-04-09 MED ORDER — GLUCOSE BLOOD VI STRP: ORAL_STRIP | 5 refills | Status: DC

## 2022-04-09 MED ORDER — LANCETS 30G MISC: 5 refills | Status: DC

## 2022-04-09 NOTE — Telephone Encounter (Signed)
Supplies sent and msg sent via [employer]

## 2022-04-09 NOTE — Telephone Encounter (Signed)
Wants to know does she need to be checking her blood sugar while she is taking mounjaro? Please advise

## 2022-04-10 ENCOUNTER — Other Ambulatory Visit: Payer: Self-pay | Admitting: Physical Medicine and Rehabilitation

## 2022-04-10 ENCOUNTER — Telehealth: Payer: Self-pay

## 2022-04-10 ENCOUNTER — Other Ambulatory Visit (HOSPITAL_COMMUNITY): Payer: Self-pay

## 2022-04-10 MED ORDER — OXYCODONE HCL 5 MG PO TABS
10.0000 mg | ORAL_TABLET | Freq: Two times a day (BID) | ORAL | 0 refills | Status: DC
  Filled 2022-04-10: qty 120, 30d supply, fill #0

## 2022-04-10 MED ORDER — OXYCODONE HCL 5 MG PO TABS: 10.0000 mg | ORAL_TABLET | Freq: Two times a day (BID) | ORAL | 0 refills | Status: DC

## 2022-04-10 NOTE — Telephone Encounter (Signed)
Patient called and stated her pharmacy does not have Oxycodone. [facility] does have Oxycodone 5 mg and 10 mg

## 2022-04-11 ENCOUNTER — Ambulatory Visit: Payer: Medicare HMO | Admitting: Physical Medicine and Rehabilitation

## 2022-04-18 ENCOUNTER — Encounter (HOSPITAL_BASED_OUTPATIENT_CLINIC_OR_DEPARTMENT_OTHER): Payer: Medicare HMO | Admitting: Physical Medicine and Rehabilitation

## 2022-04-18 ENCOUNTER — Other Ambulatory Visit: Payer: Self-pay

## 2022-04-18 DIAGNOSIS — J45991 Cough variant asthma: Secondary | ICD-10-CM

## 2022-04-18 DIAGNOSIS — M961 Postlaminectomy syndrome, not elsewhere classified: Secondary | ICD-10-CM | POA: Diagnosis not present

## 2022-04-18 MED ORDER — ALBUTEROL SULFATE (2.5 MG/3ML) 0.083% IN NEBU: 2.5000 mg | INHALATION_SOLUTION | Freq: Four times a day (QID) | RESPIRATORY_TRACT | 1 refills | Status: DC | PRN

## 2022-04-18 NOTE — Progress Notes (Signed)
Subjective:    Patient ID: Jane English, female    DOB: 07-09-1971, 51 y.o.   MRN: [id number]  HPI:  An audio/video tele-health visit is felt to be the most appropriate encounter for this patient at this time. This is a follow up tele-visit via phone. The patient is at home. MD is at office. Prior to scheduling this appointment, our staff discussed the limitations of evaluation and management by telemedicine and the availability of in-person appointments. The patient expressed understanding and agreed to proceed.    Jane English is a 51 y.o. female who returns for f/u appointment for chronic pain in her back secondary to failed back syndrome, bilateral knees secondary to chonrdomalacia of bilateral knees, pain in bilateral hands secondary to osteoarthritis, left sided lower back pain  1) Failed back syndrome -she would like to get her spinal cord stimulator removed -she continues to have severe pain -she has been using the 7.'5mg'$  meloxicam up to three times per day -she says she needs [place] to reach out to her former pain clinic to help get her medical records -Her current exercise regimen is walking and performing stretching exercises. -she is reporting decreased sensation in left lower extremity compared to right -pain is still severe despite starting meloxicam and robaxin -her hardware is MRI compatible.  -she asks when she get her spinal cord stimulator removed. She does not want to go back to her old Psychologist, sport and exercise -Last UDS was Performed on 01/26/2021, it was consistent.  -She has been taking oxycodone PRN (30MME) for years- it is helpful for her pain -she has had negative responses with Gabapentin and Lyrica.  -pain has been really severe recently.  -she has never tried Cymbalta.  -she has been experiencing more left sided low back pain since she has started working at [employer] -no more falls.  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 -she asks if a lady reached out to me about the stimulator machine, she would like to get her spinal cord stimulator removed. She does not want to get this removed where it was originally placed, but would rather get it removed at [place]  2) Chondromalacia of bilateral knees  3) Anxiety -she receives Alprazolam from Dr. Moshe Cipro, PCP- dose was recently reduced in half.  Jane English has discussed the black box warning of using opioids and benzodiazepines and highlighted the dangers of using these drugs together and discussed the adverse events including respiratory suppression, overdose, cognitive impairment and importance of compliance with current regimen.  -she has been referred to therapy by her PCP.   4) Osteoarthritis of bilateral hands  5) Right buttock pain  6) Myofascial pain  7) Chronic constipation: -has followed with GI since last visit  8) Prediabetes -she was advised by GI to discuss Wegovy vs. Ozempic with her PCP for weight loss- will also help with diabetes.  -there is plan for repeat colostomy- she had a bad experience with this last time.   9) Nausea: she does feel some some nausea with her pain medications.   10) Low mood -at times -she is handling at well.  -She loves to meet new people through her work.  -she has started a new piercing job.   11) Left lumbar radiculopathy: - it is currently really bothering her  -the meloxicam and muscle relaxers help temporarily but not for long -it is  severe all the time but she is still working through it -she is unable to have an MRI due to the spinal cord stimulator- she would like to get this removed as soon as possible - she is due for oxycodone refill today.  -she purchased an over the counter heating oil but has not tried that -she cannot tolerate gabapentin since it has caused her to sleep walk before -she has had a hard time going to work due to the pain -she has  tried amitriptyline in the past but was started on '150mg'$  and this made her feel drunk. She has not tried '10mg'$ .  -pain has been very severe -feels shooting pain throughout left leg -notes weakness -working through the pain.  -she is more interested in surgery than injections as she has not had benefit from injections before.    Pain Inventory Average Pain 7 Pain Right Now 10 My pain is constant, sharp, burning, dull, stabbing, tingling, and aching  In the last 24 hours, has pain interfered with the following? General activity 9 Relation with others 9 Enjoyment of life 9 What TIME of day is your pain at its worst? Morning and night Sleep (in general) Fair  Pain is worse with: walking, bending, sitting, and standing, some activities Pain improves with: rest, heat/ice, and medication Relief from Meds: 9  Family History  Problem Relation Age of Onset   Physical abuse Mother    Alcohol abuse Mother    Cirrhosis Mother    Lung cancer Father    Stomach cancer Father    Esophageal cancer Father    Alcohol abuse Father    Mental illness Father    Diabetes Sister    Hypertension Sister    Bipolar disorder Sister    Schizophrenia Sister    Diabetes Sister    Drug abuse Sister    HIV Sister    Pneumonia Sister        died as a baby   Alcohol abuse Brother    Hypertension Brother    Kidney disease Brother    Diabetes Brother    Drug abuse Brother    Mental illness Brother    Alcohol abuse Brother    Alcohol abuse Brother    Hypertension Brother    Diabetes Brother    Alcohol abuse Brother    Mental illness Brother        in [employer]   Alcohol abuse Brother    Alcohol abuse Brother    Bipolar disorder Brother    Hypertension Brother    Bipolar disorder Brother    Drug abuse Brother    Alcohol abuse Brother    Bipolar disorder Brother    Bipolar disorder Daughter    Bipolar disorder Son    Bipolar disorder Son    ADD / ADHD Neg Hx    Anxiety disorder Neg Hx    Dementia  Neg Hx    Depression Neg Hx    OCD Neg Hx    Seizures Neg Hx    Paranoid behavior Neg Hx    Colon cancer Neg Hx    Rectal cancer Neg Hx    Social History   Socioeconomic History   Marital status: Married    Spouse name: Herbie Baltimore   Number of children: 4   Years of education: 12   Highest education level: Some college, no degree  Occupational History   Occupation: disabled  Tobacco Use   Smoking status: Never   Smokeless tobacco: Never  Vaping Use   Vaping Use: Never used  Substance and Sexual Activity   Alcohol use: No   Drug use: No   Sexual activity: Not Currently    Partners: Male    Birth control/protection: Surgical    Comment: tubal  Other Topics Concern   Not on file  Social History Narrative   Not on file   Social Determinants of Health   Financial Resource Strain: Low Risk  (11/08/2021)   Overall Financial Resource Strain (CARDIA)    Difficulty of Paying Living Expenses: Not hard at all  Food Insecurity: No Food Insecurity (11/08/2021)   Hunger Vital Sign    Worried About Running Out of Food in the Last Year: Never true    Ran Out of Food in the Last Year: Never true  Transportation Needs: No Transportation Needs (11/08/2021)   PRAPARE - Hydrologist (Medical): No    Lack of Transportation (Non-Medical): No  Physical Activity: Insufficiently Active (11/08/2021)   Exercise Vital Sign    Days of Exercise per Week: 3 days    Minutes of Exercise per Session: 30 min  Stress: No Stress Concern Present (11/08/2021)   [place]    Feeling of Stress : Only a little  Social Connections: Moderately Isolated (11/08/2021)   Social Connection and Isolation Panel [NHANES]    Frequency of Communication with Friends and Family: More than three times a week    Frequency of Social Gatherings with Friends and Family: Once a week    Attends Religious Services: Never    Corporate treasurer or Organizations: No    Attends Music therapist: Never    Marital Status: Married   Past Surgical History:  Procedure Laterality Date   ANTERIOR CERVICAL DECOMP/DISCECTOMY FUSION  07/07/2012   Procedure: ANTERIOR CERVICAL DECOMPRESSION/DISCECTOMY FUSION 2 LEVELS;  Surgeon: Floyce Stakes, MD;  Location: [facility];  Service: Neurosurgery;  Laterality: N/A;  Cervical four-five, five - six  Anterior cervical decompression/diskectomy/fusion/plate   APPENDECTOMY  1986   BOWEL RESECTION N/A 07/29/2013   Procedure: serosal repair;  Surgeon: Adin Hector, MD;  Location: [facility];  Service: General;  Laterality: N/A;   CARPAL TUNNEL RELEASE Bilateral    COLON SURGERY N/A    Phreesia 07/29/2020   LAPAROSCOPY N/A 07/29/2013   Procedure: diagnostic laporoscopy;  Surgeon: Adin Hector, MD;  Location: [facility];  Service: General;  Laterality: N/A;   LAPAROSCOPY N/A 08/16/2013   Procedure: LAPAROSCOPY DIAGNOSTIC/LYSIS OF ADHESIONS;  Surgeon: Adin Hector, MD;  Location: [facility];  Service: General;  Laterality: N/A;   LAPAROTOMY N/A 08/16/2013   Procedure: EXPLORATORY LAPAROTOMY/SMALL BOWEL RESECTION (JEJUNUM);  Surgeon: Adin Hector, MD;  Location: [facility];  Service: General;  Laterality: N/A;   LUMBAR SPINE SURGERY  2010   x 3   LYSIS OF ADHESION  2003   Dr. Irving Shows   LYSIS OF ADHESION N/A 07/29/2013   Procedure: LYSIS OF ADHESION;  Surgeon: Adin Hector, MD;  Location: [facility];  Service: General;  Laterality: N/A;   [place]?   Linna Hoff, [place]   SPINAL CORD STIMULATOR IMPLANT     SPINE SURGERY N/A    Phreesia 07/29/2020   TRIGGER FINGER RELEASE  2009   right pinkie finger   TUBAL LIGATION  1994   Past Surgical History:  Procedure Laterality Date  ANTERIOR CERVICAL DECOMP/DISCECTOMY FUSION  07/07/2012   Procedure: ANTERIOR CERVICAL DECOMPRESSION/DISCECTOMY FUSION 2 LEVELS;  Surgeon: Floyce Stakes, MD;  Location: [facility];  Service: Neurosurgery;  Laterality: N/A;  Cervical four-five, five - six  Anterior cervical decompression/diskectomy/fusion/plate   APPENDECTOMY  1986   BOWEL RESECTION N/A 07/29/2013   Procedure: serosal repair;  Surgeon: Adin Hector, MD;  Location: [facility];  Service: General;  Laterality: N/A;   CARPAL TUNNEL RELEASE Bilateral    COLON SURGERY N/A    Phreesia 07/29/2020   LAPAROSCOPY N/A 07/29/2013   Procedure: diagnostic laporoscopy;  Surgeon: Adin Hector, MD;  [facility];  Service: General;  Laterality: N/A;   LAPAROSCOPY N/A 08/16/2013   Procedure: LAPAROSCOPY DIAGNOSTIC/LYSIS OF ADHESIONS;  Surgeon: Adin Hector, MD;  [facility];  Service: General;  Laterality: N/A;   LAPAROTOMY N/A 08/16/2013   Procedure: EXPLORATORY LAPAROTOMY/SMALL BOWEL RESECTION (JEJUNUM);  Surgeon: Adin Hector, MD;  [facility];  Service: General;  Laterality: N/A;   LUMBAR SPINE SURGERY  2010   x 3   LYSIS OF ADHESION  2003   Dr. Irving Shows   LYSIS OF ADHESION N/A 07/29/2013   Procedure: LYSIS OF ADHESION;  Surgeon: Adin Hector, MD;  [facility];  Service: General;  Laterality: N/A;   [place]?   Linna [place], [place]   SPINAL CORD STIMULATOR IMPLANT     SPINE SURGERY N/A    Phreesia 07/29/2020   TRIGGER FINGER RELEASE  2009   right pinkie finger   TUBAL LIGATION  1994   Past Medical History:  Diagnosis Date   Anemia    Asthma    Asthma flare 04/09/2013   Back pain    Bronchitis    Chronic abdominal pain    Chronic constipation    Constipation due to opioid therapy    Depression    Depression, major, single episode, severe ([place]) 10/03/2018   PHQ 9 score of 15   Diabetes mellitus without complication ([place])    Diabetes mellitus, type II ([place])    DVT (deep venous thrombosis) ([place]) 2010   GERD (gastroesophageal reflux disease)    Heart murmur    no cardiologist   Helicobacter pylori gastritis 06/11/2013   Colonoscopy Dr.  Hilarie Fredrickson   Hypertension    IBS (irritable bowel syndrome)    Migraine headache    Neuropathy    Obesity    Obsessive-compulsive disorder    PSYCHOTIC D/O W/HALLUCINATIONS CONDS CLASS ELSW 03/04/2010   Qualifier: Diagnosis of  By: Moshe Cipro MD, Margaret     PTSD (post-traumatic stress disorder)    SBO (small bowel obstruction) ([place]) 08/09/2013   Seasonal allergies 12/10/2012   Seizures ([place])    Shortness of breath    There were no vitals taken for this visit.  Opioid Risk Score:   Fall Risk Score:  `1  Depression screen [facility] 2/9     03/21/2022   11:02 AM 03/12/2022   11:25 AM 02/05/2022    8:47 AM 12/06/2021    9:18 AM 11/30/2021    9:27 AM 11/08/2021    8:10 AM 11/07/2021   10:31 AM  Depression screen PHQ 2/9  Decreased Interest 0 0 0 0 0 0 0  Down, Depressed, Hopeless 0 0 0 0 0 0 0  PHQ - 2 Score 0 0 0 0 0 0 0    Review of Systems  Constitutional: Negative.   HENT: Negative.  Eyes: Negative.   Respiratory: Negative.    Cardiovascular: Negative.   Gastrointestinal: Negative.   Endocrine: Negative.   Genitourinary: Negative.   Musculoskeletal:  Positive for back pain.       Pain in both knee & both shoulders  Skin: Negative.   Allergic/Immunologic: Negative.   Neurological: Negative.   Hematological: Negative.   Psychiatric/Behavioral:  Positive for dysphoric mood.   All other systems reviewed and are negative.      Objective:  Not performed    Assessment & Plan:  1. Chronic Pain of Bilateral Shoulders L>R: Continue HEP as Tolerated. Continue to Monitor.  2. Failed Back Syndrome of Lumbar Syndrome : Continue HEP as Tolerated. Continue current medication regimen. Continue to Monitor. Asked for phone number of [employer] rep and I will call her today to discuss removal of her spinal cord stimulator which is no longer functional -continue applying deep blue -called Manuela Schwartz [employer] rep to let her know patient would like spinal cord stimulator removed and to ask how I can help  with this. She recommended referral to Dr. Davy Pique at [facility]. Discussed that I have already referred patient there and she has not heard from them. Placed new referral today and Manuela Schwartz will reach out to Dr. Davy Pique as well.  -Appreciate Manuela Schwartz communicating with [facility] that her current spinal cord stimulator is MRI compatible. -continue percocet BID PRN Prescribed robaxin and meloxicam. Discussed risks and benefits.  -Discussed current symptoms of pain and history of pain.  -Discussed benefits of exercise in reducing pain. -Discussed following foods that may reduce pain: 1) Ginger (especially studied for arthritis)- reduce leukotriene production to decrease inflammation 2) Blueberries- high in phytonutrients that decrease inflammation 3) Salmon- marine omega-3s reduce joint swelling and pain 4) Pumpkin seeds- reduce inflammation 5) dark chocolate- reduces inflammation 6) turmeric- reduces inflammation 7) tart cherries - reduce pain and stiffness 8) extra virgin olive oil - its compound olecanthal helps to block prostaglandins  9) chili peppers- can be eaten or applied topically via capsaicin 10) mint- helpful for headache, muscle aches, joint pain, and itching 11) garlic- reduces inflammation  Link to further information on diet for chronic pain: [email or web address]  -Provided with a pain relief journal and discussed that it contains foods and lifestyle tips to naturally help to improve pain. Discussed that these lifestyle strategies are also very good for health unlike some medications which can have negative side effects. Discussed that the act of keeping a journal can be therapeutic and helpful to realize patterns what helps to trigger and alleviate pain.  l 3. Chronic Lower Back Pain without Sciatica: Continue HEP as Tolerated. Continue to Monitor. Referred to [facility] therapy -texted her exercises  she can do to improve spinal alignment and improve pain -discussed gabapentin but she cannot use this due to history of sleepwalking on this medicine -recommended trying doTerra Deep Blue essential oil -discussed that it is ok for use to provide an early refill of oxycodone this times if she needs due to her increased pain 4. Left Knee Pain: No Complaints today. Continue HEP as Tolerated. Continue to Monitor. Discussed Wobenzymes.  5. Muscle Spasm of Shoulders/ Upper Back : Continue Flexeril. Continue to Monitor. 6. Chronic Pain Syndrome: Refilled: Oxycodone '5mg'$ /325 mg two tablets twice a day #120. We will continue the opioid monitoring program, this consists of regular clinic visits, examinations, urine drug screen, pill counts as well as use of [place] Controlled Substance Reporting system.  7. Constipation:  -Continue  Linzess as prescribed by GI -Provided list of following foods that help with constipation and highlighted a few: 1) prunes- contain high amounts of fiber.  2) apples- has a form of dietary fiber called pectin that accelerates stool movement and increases beneficial gut bacteria 3) pears- in addition to fiber, also high in fructose and sorbitol which have laxative effect 4) figs- contain an enzyme ficin which helps to speed colonic transit 5) kiwis- contain an enzyme actinidin that improves gut motility and reduces constipation 6) oranges- rich in pectin (like apples) 7) grapefruits- contain a flavanol naringenin which has a laxative effect 8) vegetables- rich in fiber and also great sources of folate, vitamin C, and K 9) artichoke- high in inulin, prebiotic great for the microbiome 10) chicory- increases stool frequency and softness (can be added to coffee) 11) rhubarb- laxative effect 12) sweet potato- high fiber 13) beans, peas, and lentils- contain both soluble and insoluble fiber 14) chia seeds- improves intestinal health and gut flora 15) flaxseeds- laxative  effect 16) whole grain rye bread- high in fiber 17) oat bran- high in soluble and insoluble fiber 18) kefir- softens stools -recommended to try at least one of these foods every day.  -drink 6-8 glasses of water per day -walk regularly, especially after meals.   8) Anxiety: -Continue alprazolam as prescribed by Dr. Moshe Cipro. Appreciate Eunice's counseling regarding increased risk of side effects with use of Alprazolam and Oxycodone.  -Discussed exercise and meditation as tools to decrease anxiety. -Recommended Down Dog Yoga app -Discussed spending time outdoors. -Discussed positive re-framing of anxiety.  -Discussed the following foods that have been show to reduce anxiety: 1) [place] nuts, mushrooms, soy beans due to their high selenium content. Upper limit of toxicity of selenium is 457mg/day so no more than 3-4 bBolivianuts per day.  2) Fatty fish such as salmon, mackerel, sardines, trout, and herring- high in omega-3 fatty acids 3) Eggs- increases serotonin and dopamine 4) Pumpkin seeds- high in omega-3 fatty acids 5) dark chocolate- high in flavanols that increase blood flow to brain 6) turmeric- take with black pepper to increase absorption 7) chamomile tea- antioxidant and anti-inflammatory properties 8) yogurt without sugar- supports gut-brain axis 9) green tea- contains L- theanine 10) blueberries- high in vitamin C and antioxidants 11) t[place] high in tryptophan which gets converted to serotonin 12) bell peppers- rich in vitamin C and antioxidants 13) citrus fruits- rich in vitamin C and antioxidants 14) almonds- high in vitamin E and healthy fats 15) chia seeds- high in omega-3 fatty acids  9) Diabetes type 2 -HgbA1c reviewed- 6.7 -continue Mounjaro, discussed that nausea is a common side effect and usually shortlived.  -check CBGs daily, log, and bring log to follow-up appointment -avoid sugar, bread, pasta, rice -avoid snacking -try to incorporate into your diet some  of the following foods which are good for diabetes: 1) cinnamon- imitates effects of insulin, increasing glucose transport into cells (CWestern Saharaor VGuinea-Bissaucinnamon is best, least processed) 2) nuts- can slow down the blood sugar response of carbohydrate rich foods 3) oatmeal- contains and anti-inflammatory compound avenanthramide 4) whole-milk yogurt (best types are no sugar, GMayotteyogurt, or goat/sheep yogurt) 5) beans- high in protein, fiber, and vitamins, low glycemic index 6) broccoli- great source of vitamin A and C 7) quinoa- higher in protein and fiber than other grains 8) spinach- high in vitamin A, fiber, and protein 9) olive oil- reduces glucose levels, LDL, and triglycerides 10) salmon- excellent amount of omega-3-fatty acids 11)  walnuts- rich in antioxidants 12) apples- high in fiber and quercetin 13) carrots- highly nutritious with low impact on blood sugar 14) eggs- improve HDL (good cholesterol), high in protein, keep you satiated 15) turmeric: improves blood sugars, cardiovascular disease, and protects kidney health 16) garlic: improves blood sugar, blood pressure, pain 17) tomatoes: highly nutritious with low impact on blood sugar   10) Obesity: -Educated regarding health benefits of weight loss- for pain, general health, chronic disease prevention, immune health, mental health.  -prescribed magnesium gluconate '250mg'$  HS -Will monitor weight every visit.  -Consider Ro[place] tea daily.  -Discussed the benefits of intermittent fasting. -Discussed foods that can assist in weight loss: 1) leafy greens- high in fiber and nutrients 2) dark chocolate- improves metabolism (if prefer sweetened, best to sweeten with honey instead of sugar).  3) cruciferous vegetables- high in fiber and protein 4) full fat yogurt: high in healthy fat, protein, calcium, and probiotics 5) apples- high in a variety of phytochemicals 6) nuts- high in fiber and protein that increase feelings of  fullness 7) grapefruit: rich in nutrients, antioxidants, and fiber (not to be taken with anticoagulation) 8) beans- high in protein and fiber 9) salmon- has high quality protein and healthy fats 10) green tea- rich in polyphenols 11) eggs- rich in choline and vitamin D 12) tuna- high protein, boosts metabolism 13) avocado- decreases visceral abdominal fat 14) chicken (pasture raised): high in protein and iron 15) blueberries- reduce abdominal fat and cholesterol 16) whole grains- decreases calories retained during digestion, speeds metabolism 17) chia seeds- English appetite 18) chilies- increases fat metabolism  -Discussed supplements that can be used:  1) Metatrim '400mg'$  BID 30 minutes before breakfast and dinner  2) Sphaeranthus indicus and Garcinia mangostana (combinations of these and #1 can be found in capsicum and zychrome  3) green coffee bean extract '400mg'$  twice per day or Irvingia (african mango) 150 to '300mg'$  twice per day.  11) Left lumbar radiculopathy: -MRI ordered of lumbar spine, discussed suspected diagnosis of lumbar radiculopathy given lower back pain radiating into lower extremity. Unfortunately, looks like she will be unable to get MRI due to her spinal cord stimulator. Referred to [facility] for removal of spinal cord stimulator. CT lumbar spine ordered [facility] at [facility] given new neurological deficits of decreased sensation. Discussed going to ED if neurological symptoms worsen -continue amitriptyline '10mg'$  HS, discussed that this could potentially help her sleep better at night without having to take a percocet at this time -discontinued meloxicam since not helping much and has several potential side effects. -reviewed the results of her CT lumbar spine.  Discussed that there are no significant concerning findings.   5 minutes spent in discussion of her pain, recommended not to use meloxicam 7.'5mg'$  more than twice per day

## 2022-04-19 ENCOUNTER — Telehealth: Payer: Self-pay | Admitting: Family Medicine

## 2022-04-19 NOTE — Telephone Encounter (Signed)
Sent via [facility]

## 2022-04-19 NOTE — Telephone Encounter (Signed)
Patient called in regard to breathing machine and solution. Surrounding pharmacies would not take [employer], Patient has found [facility] in [place]  / [place] that accepts insurance and wants orders sent there.    [facility] # [phone]  Patient also wants a call back and address to [facility].

## 2022-04-20 ENCOUNTER — Other Ambulatory Visit: Payer: Self-pay | Admitting: Family Medicine

## 2022-04-22 ENCOUNTER — Ambulatory Visit: Payer: Medicare HMO | Admitting: Physical Medicine and Rehabilitation

## 2022-04-24 ENCOUNTER — Telehealth: Payer: Self-pay

## 2022-04-24 NOTE — Telephone Encounter (Signed)
Patient called has not yet received her nebulizer machine yet from [place], patient tracking and it still sitting there at [facility].  Has not Received no nebulizer or medicine.  Please help to get her DME.

## 2022-04-24 NOTE — Telephone Encounter (Signed)
Resent via [facility]

## 2022-05-01 ENCOUNTER — Ambulatory Visit
Admission: RE | Admit: 2022-05-01 | Discharge: 2022-05-01 | Disposition: A | Discharge status: Home / Self Care | Payer: Medicare HMO | Source: Ambulatory Visit | Attending: Physical Medicine and Rehabilitation | Admitting: Physical Medicine and Rehabilitation

## 2022-05-01 DIAGNOSIS — M545 Low back pain, unspecified: Secondary | ICD-10-CM | POA: Diagnosis not present

## 2022-05-01 DIAGNOSIS — M5416 Radiculopathy, lumbar region: Secondary | ICD-10-CM

## 2022-05-02 ENCOUNTER — Encounter (HOSPITAL_BASED_OUTPATIENT_CLINIC_OR_DEPARTMENT_OTHER): Payer: Medicare HMO | Admitting: Physical Medicine and Rehabilitation

## 2022-05-02 ENCOUNTER — Other Ambulatory Visit: Payer: Self-pay | Admitting: Family Medicine

## 2022-05-02 VITALS — BP 126/82 | HR 92 | Ht 66.0 in | Wt 180.4 lb

## 2022-05-02 DIAGNOSIS — M5416 Radiculopathy, lumbar region: Secondary | ICD-10-CM | POA: Diagnosis not present

## 2022-05-02 DIAGNOSIS — M961 Postlaminectomy syndrome, not elsewhere classified: Secondary | ICD-10-CM | POA: Diagnosis not present

## 2022-05-02 DIAGNOSIS — R112 Nausea with vomiting, unspecified: Secondary | ICD-10-CM

## 2022-05-02 MED ORDER — ONDANSETRON HCL 4 MG PO TABS: 4.0000 mg | ORAL_TABLET | Freq: Three times a day (TID) | ORAL | 0 refills | Status: AC | PRN

## 2022-05-02 MED ORDER — OXYCODONE HCL 5 MG PO TABS: 10.0000 mg | ORAL_TABLET | Freq: Two times a day (BID) | ORAL | 0 refills | Status: DC

## 2022-05-02 NOTE — Progress Notes (Signed)
Subjective:    Patient ID: Jane English, female    DOB: [birth year], 51 y.o.   MRN: [id number]  HPI:    Jane English is a 51 y.o. female who returns for f/u appointment for chronic pain in her back secondary to failed back syndrome, bilateral knees secondary to chonrdomalacia of bilateral knees, pain in bilateral hands secondary to osteoarthritis, left sided lower back pain  1) Failed back syndrome -she would like to get her spinal cord stimulator removed, discussed that I have discussed with the [employer] rep -once she gets moving pain is better, but she feels grinding with movement -got her MRI done yesterday -she has called Dr. Keene Breath office and the records have not been called, I called today to see if this can be expedited.  -she continues to have severe pain -she has been using the 7.'5mg'$  meloxicam up to three times per day -she says she needs [place] to reach out to her former pain clinic to help get her medical records -Her current exercise regimen is walking and performing stretching exercises. -she is reporting decreased sensation in left lower extremity compared to right -pain is still severe despite starting meloxicam and robaxin -her hardware is MRI compatible.  -she asks when she get her spinal cord stimulator removed. She does not want to go back to her old Psychologist, sport and exercise -Last UDS was Performed on 01/26/2021, it was consistent.  -She has been taking oxycodone PRN (30MME) for years- it is helpful for her pain -she has had negative responses with Gabapentin and Lyrica.  -pain has been really severe recently.  -she has never tried Cymbalta.  -she has been experiencing more left sided low back pain since she has started working at [employer] -no more falls.  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 -she asks if a lady reached out to me about the stimulator machine, she would like to get her  spinal cord stimulator removed. She does not want to get this removed where it was originally placed, but would rather get it removed at [place]  2) Chondromalacia of bilateral knees  3) Anxiety -she receives Alprazolam from Dr. Moshe Cipro, PCP- dose was recently reduced in half.  [name] has discussed the black box warning of using opioids and benzodiazepines and highlighted the dangers of using these drugs together and discussed the adverse events including respiratory suppression, overdose, cognitive impairment and importance of compliance with current regimen.  -she has been referred to therapy by her PCP.   4) Osteoarthritis of bilateral hands  5) Right buttock pain  6) Myofascial pain  7) Chronic constipation: -has followed with GI since last visit  8) Prediabetes -she was advised by GI to discuss Wegovy vs. Ozempic with her PCP for weight loss- will also help with diabetes.  -there is plan for repeat colostomy- she had a bad experience with this last time.  -taking Monjouro -she lost weight from 190s to 180s with this -she does not eat as much with this -she gets sick when she eats things too sweet  9) Nausea: she does feel some some nausea with her pain medications.   10) Low mood -at times -she is handling at well.  -She loves to meet new people through her work.  -she has started a new piercing job.   11) Left lumbar radiculopathy: - it is currently really bothering her  -  the meloxicam and muscle relaxers help temporarily but not for long -it is severe all the time but she is still working through it -she is unable to have an MRI due to the spinal cord stimulator- she would like to get this removed as soon as possible - she is due for oxycodone refill today.  -she purchased an over the counter heating oil but has not tried that -she cannot tolerate gabapentin since it has caused her to sleep walk before -she has had a hard time going to work due to the  pain -she has tried amitriptyline in the past but was started on '150mg'$  and this made her feel drunk. She has not tried '10mg'$ .  -pain has been very severe -feels shooting pain throughout left leg -notes weakness -working through the pain.  -she is more interested in surgery than injections as she has not had benefit from injections before.    Pain Inventory Average Pain 10 Pain Right Now 9 My pain is constant, sharp, burning, dull, stabbing, tingling, and aching  In the last 24 hours, has pain interfered with the following? General activity 8 Relation with others 8 Enjoyment of life 9 What TIME of day is your pain at its worst? Morning and night Sleep (in general) Fair  Pain is worse with: walking, bending, sitting, standing, and some activites, some activities Pain improves with: heat/ice, therapy/exercise, medication, and TENS Relief from Meds: 9  Family History  Problem Relation Age of Onset   Physical abuse Mother    Alcohol abuse Mother    Cirrhosis Mother    Lung cancer Father    Stomach cancer Father    Esophageal cancer Father    Alcohol abuse Father    Mental illness Father    Diabetes Sister    Hypertension Sister    Bipolar disorder Sister    Schizophrenia Sister    Diabetes Sister    Drug abuse Sister    HIV Sister    Pneumonia Sister        died as a baby   Alcohol abuse Brother    Hypertension Brother    Kidney disease Brother    Diabetes Brother    Drug abuse Brother    Mental illness Brother    Alcohol abuse Brother    Alcohol abuse Brother    Hypertension Brother    Diabetes Brother    Alcohol abuse Brother    Mental illness Brother        in [place]   Alcohol abuse Brother    Alcohol abuse Brother    Bipolar disorder Brother    Hypertension Brother    Bipolar disorder Brother    Drug abuse Brother    Alcohol abuse Brother    Bipolar disorder Brother    Bipolar disorder Daughter    Bipolar disorder Son    Bipolar disorder Son    ADD /  ADHD Neg Hx    Anxiety disorder Neg Hx    Dementia Neg Hx    Depression Neg Hx    OCD Neg Hx    Seizures Neg Hx    Paranoid behavior Neg Hx    Colon cancer Neg Hx    Rectal cancer Neg Hx    Social History   Socioeconomic History   Marital status: Married    Spouse name: Herbie Baltimore   Number of children: 4   Years of education: 12   Highest education level: Some college, no degree  Occupational History  Occupation: disabled  Tobacco Use   Smoking status: Never   Smokeless tobacco: Never  Vaping Use   Vaping Use: Never used  Substance and Sexual Activity   Alcohol use: No   Drug use: No   Sexual activity: Not Currently    Partners: Male    Birth control/protection: Surgical    Comment: tubal  Other Topics Concern   Not on file  Social History Narrative   Not on file   Social Determinants of Health   Financial Resource Strain: Low Risk  (11/08/2021)   Overall Financial Resource Strain (CARDIA)    Difficulty of Paying Living Expenses: Not hard at all  Food Insecurity: No Food Insecurity (11/08/2021)   Hunger Vital Sign    Worried About Running Out of Food in the Last Year: Never true    Ran Out of Food in the Last Year: Never true  Transportation Needs: No Transportation Needs (11/08/2021)   PRAPARE - Hydrologist (Medical): No    Lack of Transportation (Non-Medical): No  Physical Activity: Insufficiently Active (11/08/2021)   Exercise Vital Sign    Days of Exercise per Week: 3 days    Minutes of Exercise per Session: 30 min  Stress: No Stress Concern Present (11/08/2021)   [place]    Feeling of Stress : Only a little  Social Connections: Moderately Isolated (11/08/2021)   Social Connection and Isolation Panel [NHANES]    Frequency of Communication with Friends and Family: More than three times a week    Frequency of Social Gatherings with Friends and Family: Once a week     Attends Religious Services: Never    Marine scientist or Organizations: No    Attends Music therapist: Never    Marital Status: Married   Past Surgical History:  Procedure Laterality Date   ANTERIOR CERVICAL DECOMP/DISCECTOMY FUSION  07/07/2012   Procedure: ANTERIOR CERVICAL DECOMPRESSION/DISCECTOMY FUSION 2 LEVELS;  Surgeon: Floyce Stakes, MD;  Location: [facility];  Service: Neurosurgery;  Laterality: N/A;  Cervical four-five, five - six  Anterior cervical decompression/diskectomy/fusion/plate   APPENDECTOMY  1986   BOWEL RESECTION N/A 07/29/2013   Procedure: serosal repair;  Surgeon: Adin Hector, MD;  Location: [facility];  Service: General;  Laterality: N/A;   CARPAL TUNNEL RELEASE Bilateral    COLON SURGERY N/A    Phreesia 07/29/2020   LAPAROSCOPY N/A 07/29/2013   Procedure: diagnostic laporoscopy;  Surgeon: Adin Hector, MD;  Location: [facility];  Service: General;  Laterality: N/A;   LAPAROSCOPY N/A 08/16/2013   Procedure: LAPAROSCOPY DIAGNOSTIC/LYSIS OF ADHESIONS;  Surgeon: Adin Hector, MD;  Location: [facility];  Service: General;  Laterality: N/A;   LAPAROTOMY N/A 08/16/2013   Procedure: EXPLORATORY LAPAROTOMY/SMALL BOWEL RESECTION (JEJUNUM);  Surgeon: Adin Hector, MD;  Location: [facility];  Service: General;  Laterality: N/A;   LUMBAR SPINE SURGERY  2010   x 3   LYSIS OF ADHESION  2003   Dr. Irving Shows   LYSIS OF ADHESION N/A 07/29/2013   Procedure: LYSIS OF ADHESION;  Surgeon: Adin Hector, MD;  Location: [facility];  Service: General;  Laterality: N/A;   [place]?   [place], [place]   SPINAL CORD STIMULATOR IMPLANT     SPINE SURGERY N/A    Phreesia 07/29/2020   TRIGGER FINGER RELEASE  2009   right pinkie finger  TUBAL LIGATION  1994   Past Surgical History:  Procedure Laterality Date   ANTERIOR CERVICAL DECOMP/DISCECTOMY FUSION  07/07/2012   Procedure: ANTERIOR CERVICAL DECOMPRESSION/DISCECTOMY FUSION 2 LEVELS;   Surgeon: Floyce Stakes, MD;  Location: [facility];  Service: Neurosurgery;  Laterality: N/A;  Cervical four-five, five - six  Anterior cervical decompression/diskectomy/fusion/plate   APPENDECTOMY  1986   BOWEL RESECTION N/A 07/29/2013   Procedure: serosal repair;  Surgeon: Adin Hector, MD;  Location: [facility];  Service: General;  Laterality: N/A;   CARPAL TUNNEL RELEASE Bilateral    COLON SURGERY N/A    Phreesia 07/29/2020   LAPAROSCOPY N/A 07/29/2013   Procedure: diagnostic laporoscopy;  Surgeon: Adin Hector, MD;  [facility];  Service: General;  Laterality: N/A;   LAPAROSCOPY N/A 08/16/2013   Procedure: LAPAROSCOPY DIAGNOSTIC/LYSIS OF ADHESIONS;  Surgeon: Adin Hector, MD;  [facility];  Service: General;  Laterality: N/A;   LAPAROTOMY N/A 08/16/2013   Procedure: EXPLORATORY LAPAROTOMY/SMALL BOWEL RESECTION (JEJUNUM);  Surgeon: Adin Hector, MD;  [facility];  Service: General;  Laterality: N/A;   LUMBAR SPINE SURGERY  2010   x 3   LYSIS OF ADHESION  2003   Dr. Irving Shows   LYSIS OF ADHESION N/A 07/29/2013   Procedure: LYSIS OF ADHESION;  Surgeon: Adin Hector, MD;  [facility];  Service: General;  Laterality: N/A;   [place]?   [place], [place]   SPINAL CORD STIMULATOR IMPLANT     SPINE SURGERY N/A    Phreesia 07/29/2020   TRIGGER FINGER RELEASE  2009   right pinkie finger   TUBAL LIGATION  1994   Past Medical History:  Diagnosis Date   Anemia    Asthma    Asthma flare 04/09/2013   Back pain    Bronchitis    Chronic abdominal pain    Chronic constipation    Constipation due to opioid therapy    Depression    Depression, major, single episode, severe ([place]) 10/03/2018   PHQ 9 score of 15   Diabetes mellitus without complication ([place])    Diabetes mellitus, type II ([place])    DVT (deep venous thrombosis) ([place]) 2010   GERD (gastroesophageal reflux disease)    Heart murmur    no cardiologist    Helicobacter pylori gastritis 06/11/2013   Colonoscopy Dr. Hilarie Fredrickson   Hypertension    IBS (irritable bowel syndrome)    Migraine headache    Neuropathy    Obesity    Obsessive-compulsive disorder    PSYCHOTIC D/O W/HALLUCINATIONS CONDS CLASS ELSW 03/04/2010   Qualifier: Diagnosis of  By: Moshe Cipro MD, Margaret     PTSD (post-traumatic stress disorder)    SBO (small bowel obstruction) ([place]) 08/09/2013   Seasonal allergies 12/10/2012   Seizures ([facility])    Shortness of breath    BP 126/82   Pulse 92   Ht '5\' 6"'$  (1.676 m)   Wt 180 lb 6.4 oz (81.8 kg)   SpO2 94%   BMI 29.12 kg/m   Opioid Risk Score:   Fall Risk Score:  `1  Depression screen [facility] 2/9     03/21/2022   11:02 AM 03/12/2022   11:25 AM 02/05/2022    8:47 AM 12/06/2021    9:18 AM 11/30/2021    9:27 AM 11/08/2021    8:10 AM 11/07/2021   10:31 AM  Depression screen PHQ 2/9  Decreased Interest 0 0 0 0  0 0 0  Down, Depressed, Hopeless 0 0 0 0 0 0 0  PHQ - 2 Score 0 0 0 0 0 0 0    Review of Systems  Constitutional: Negative.   HENT: Negative.    Eyes: Negative.   Respiratory: Negative.    Cardiovascular: Negative.   Gastrointestinal: Negative.   Endocrine: Negative.   Genitourinary: Negative.   Musculoskeletal:  Positive for back pain.       Pain in both knee & both shoulders  Skin: Negative.   Allergic/Immunologic: Negative.   Neurological: Negative.   Hematological: Negative.   Psychiatric/Behavioral:  Positive for dysphoric mood.   All other systems reviewed and are negative.      Objective:  Not performed    Assessment & Plan:  1. Chronic Pain of Bilateral Shoulders L>R: Continue HEP as Tolerated. Continue to Monitor.  2. Failed Back Syndrome of Lumbar Syndrome : Continue HEP as Tolerated. Continue current medication regimen. Continue to Monitor. Asked for phone number of [employer] rep and I will call her today to discuss removal of her spinal cord stimulator which is no longer functional -continue applying deep  blue -called Manuela Schwartz [employer] rep to let her know patient would like spinal cord stimulator removed and to ask how I can help with this. She recommended referral to Dr. Davy Pique at [facility]. Discussed that I have already referred patient there and she has not heard from them. Placed new referral today and Manuela Schwartz will reach out to Dr. Davy Pique as well.  -Appreciate Manuela Schwartz communicating with [facility] that her current spinal cord stimulator is MRI compatible. -continue percocet BID PRN Prescribed robaxin and meloxicam. Discussed risks and benefits.  -Discussed current symptoms of pain and history of pain.  -Discussed benefits of exercise in reducing pain. -Discussed following foods that may reduce pain: 1) Ginger (especially studied for arthritis)- reduce leukotriene production to decrease inflammation 2) Blueberries- high in phytonutrients that decrease inflammation 3) Salmon- marine omega-3s reduce joint swelling and pain 4) Pumpkin seeds- reduce inflammation 5) dark chocolate- reduces inflammation 6) turmeric- reduces inflammation 7) tart cherries - reduce pain and stiffness 8) extra virgin olive oil - its compound olecanthal helps to block prostaglandins  9) chili peppers- can be eaten or applied topically via capsaicin 10) mint- helpful for headache, muscle aches, joint pain, and itching 11) garlic- reduces inflammation  Link to further information on diet for chronic pain: [email or web address]  -Provided with a pain relief journal and discussed that it contains foods and lifestyle tips to naturally help to improve pain. Discussed that these lifestyle strategies are also very good for health unlike some medications which can have negative side effects. Discussed that the act of keeping a journal can be therapeutic and helpful to realize patterns what helps to trigger and alleviate pain.  l 3. Chronic  Lower Back Pain without Sciatica: Continue HEP as Tolerated. Continue to Monitor. Referred to [facility] -texted her exercises she can do to improve spinal alignment and improve pain -discussed gabapentin but she cannot use this due to history of sleepwalking on this medicine -recommended trying doTerra Deep Blue essential oil -discussed that it is ok for use to provide an early refill of oxycodone this times if she needs due to her increased pain 4. Left Knee Pain: No Complaints today. Continue HEP as Tolerated. Continue to Monitor. Discussed Wobenzymes.  5. Muscle Spasm of Shoulders/ Upper Back : Continue Flexeril. Continue to Monitor. 6. Chronic Pain Syndrome: Refilled: Oxycodone '5mg'$ /325  mg two tablets twice a day #120. We will continue the opioid monitoring program, this consists of regular clinic visits, examinations, urine drug screen, pill counts as well as use of [place] Controlled Substance Reporting system.   7. Constipation:  -Continue Linzess as prescribed by GI -Provided list of following foods that help with constipation and highlighted a few: 1) prunes- contain high amounts of fiber.  2) apples- has a form of dietary fiber called pectin that accelerates stool movement and increases beneficial gut bacteria 3) pears- in addition to fiber, also high in fructose and sorbitol which have laxative effect 4) figs- contain an enzyme ficin which helps to speed colonic transit 5) kiwis- contain an enzyme actinidin that improves gut motility and reduces constipation 6) oranges- rich in pectin (like apples) 7) grapefruits- contain a flavanol naringenin which has a laxative effect 8) vegetables- rich in fiber and also great sources of folate, vitamin C, and K 9) artichoke- high in inulin, prebiotic great for the microbiome 10) chicory- increases stool frequency and softness (can be added to coffee) 11) rhubarb- laxative effect 12) sweet potato- high fiber 13) beans, peas, and  lentils- contain both soluble and insoluble fiber 14) chia seeds- improves intestinal health and gut flora 15) flaxseeds- laxative effect 16) whole grain rye bread- high in fiber 17) oat bran- high in soluble and insoluble fiber 18) kefir- softens stools -recommended to try at least one of these foods every day.  -drink 6-8 glasses of water per day -walk regularly, especially after meals.   8) Anxiety: -Continue alprazolam as prescribed by Dr. Moshe Cipro. Appreciate Eunice's counseling regarding increased risk of side effects with use of Alprazolam and Oxycodone.  -Discussed exercise and meditation as tools to decrease anxiety. -Recommended Down Dog Yoga app -Discussed spending time outdoors. -Discussed positive re-framing of anxiety.  -Discussed the following foods that have been show to reduce anxiety: 1) [place] nuts, mushrooms, soy beans due to their high selenium content. Upper limit of toxicity of selenium is 430mg/day so no more than 3-4 bBolivianuts per day.  2) Fatty fish such as salmon, mackerel, sardines, trout, and herring- high in omega-3 fatty acids 3) Eggs- increases serotonin and dopamine 4) Pumpkin seeds- high in omega-3 fatty acids 5) dark chocolate- high in flavanols that increase blood flow to brain 6) turmeric- take with black pepper to increase absorption 7) chamomile tea- antioxidant and anti-inflammatory properties 8) yogurt without sugar- supports gut-brain axis 9) green tea- contains L- theanine 10) blueberries- high in vitamin C and antioxidants 11) t[place] high in tryptophan which gets converted to serotonin 12) bell peppers- rich in vitamin C and antioxidants 13) citrus fruits- rich in vitamin C and antioxidants 14) almonds- high in vitamin E and healthy fats 15) chia seeds- high in omega-3 fatty acids  9) Diabetes type 2 -HgbA1c reviewed- 6.7 -continue Mounjaro, discussed that nausea is a common side effect and usually shortlived.  -check CBGs daily, log,  and bring log to follow-up appointment -avoid sugar, bread, pasta, rice -avoid snacking -try to incorporate into your diet some of the following foods which are good for diabetes: 1) cinnamon- imitates effects of insulin, increasing glucose transport into cells (CWestern Saharaor VGuinea-Bissaucinnamon is best, least processed) 2) nuts- can slow down the blood sugar response of carbohydrate rich foods 3) oatmeal- contains and anti-inflammatory compound avenanthramide 4) whole-milk yogurt (best types are no sugar, GMayotteyogurt, or goat/sheep yogurt) 5) beans- high in protein, fiber, and vitamins, low glycemic index 6) broccoli-  great source of vitamin A and C 7) quinoa- higher in protein and fiber than other grains 8) spinach- high in vitamin A, fiber, and protein 9) olive oil- reduces glucose levels, LDL, and triglycerides 10) salmon- excellent amount of omega-3-fatty acids 11) walnuts- rich in antioxidants 12) apples- high in fiber and quercetin 13) carrots- highly nutritious with low impact on blood sugar 14) eggs- improve HDL (good cholesterol), high in protein, keep you satiated 15) turmeric: improves blood sugars, cardiovascular disease, and protects kidney health 16) garlic: improves blood sugar, blood pressure, pain 17) tomatoes: highly nutritious with low impact on blood sugar   10) Obesity: -Educated regarding health benefits of weight loss- for pain, general health, chronic disease prevention, immune health, mental health.  -prescribed magnesium gluconate '250mg'$  HS -Will monitor weight every visit.  -Consider Ro[place]ois tea daily.  -Discussed the benefits of intermittent fasting. -Discussed foods that can assist in weight loss: 1) leafy greens- high in fiber and nutrients 2) dark chocolate- improves metabolism (if prefer sweetened, best to sweeten with honey instead of sugar).  3) cruciferous vegetables- high in fiber and protein 4) full fat yogurt: high in healthy fat, protein, calcium,  and probiotics 5) apples- high in a variety of phytochemicals 6) nuts- high in fiber and protein that increase feelings of fullness 7) grapefruit: rich in nutrients, antioxidants, and fiber (not to be taken with anticoagulation) 8) beans- high in protein and fiber 9) salmon- has high quality protein and healthy fats 10) green tea- rich in polyphenols 11) eggs- rich in choline and vitamin D 12) tuna- high protein, boosts metabolism 13) avocado- decreases visceral abdominal fat 14) chicken (pasture raised): high in protein and iron 15) blueberries- reduce abdominal fat and cholesterol 16) whole grains- decreases calories retained during digestion, speeds metabolism 17) chia seeds- English appetite 18) chilies- increases fat metabolism  -Discussed supplements that can be used:  1) Metatrim '400mg'$  BID 30 minutes before breakfast and dinner  2) Sphaeranthus indicus and Garcinia mangostana (combinations of these and #1 can be found in capsicum and zychrome  3) green coffee bean extract '400mg'$  twice per day or Irvingia (african mango) 150 to '300mg'$  twice per day.  11) Left lumbar radiculopathy: -MRI ordered of lumbar spine, discussed suspected diagnosis of lumbar radiculopathy given lower back pain radiating into lower extremity. Unfortunately, looks like she will be unable to get MRI due to her spinal cord stimulator. Referred to [place] for removal of spinal cord stimulator. CT lumbar spine ordered [facility] at [facility] given new neurological deficits of decreased sensation. Discussed going to ED if neurological symptoms worsen -continue amitriptyline '10mg'$  HS, discussed that this could potentially help her sleep better at night without having to take a percocet at this time -discontinued meloxicam since not helping much and has several potential side effects. -reviewed the results of her CT lumbar spine.  Discussed that there are no significant concerning findings.  -discussed I will call her today  once Mri results are available.  -f/u with Dr. Gean Quint about removal of the spinal cord stimulator, discussed with [employer] rep trying to expedite this removal as this is very important to her -refilled her oxycodone -called Dr. Keene Breath office at [facility] to see if we can get records sent to Dr. Tretha Sciara office -Hopefully getting MRI will be helpful to Dr. Gean Quint for spinal cord stimulator removal well.   12) Nausea -refilled Zofran

## 2022-05-03 NOTE — Addendum Note (Signed)
Addended by: Izora Ribas on: 05/03/2022 10:30 AM   Modules accepted: Orders

## 2022-05-08 ENCOUNTER — Other Ambulatory Visit: Payer: Self-pay | Admitting: Family Medicine

## 2022-05-09 ENCOUNTER — Ambulatory Visit: Payer: Medicare HMO | Admitting: Physical Medicine and Rehabilitation

## 2022-05-09 ENCOUNTER — Other Ambulatory Visit: Payer: Self-pay | Admitting: Family Medicine

## 2022-05-10 ENCOUNTER — Telehealth: Payer: Self-pay

## 2022-05-10 ENCOUNTER — Other Ambulatory Visit: Payer: Self-pay | Admitting: Physical Medicine and Rehabilitation

## 2022-05-10 ENCOUNTER — Encounter: Payer: Medicare HMO | Attending: Physical Medicine and Rehabilitation | Admitting: Physical Medicine and Rehabilitation

## 2022-05-10 ENCOUNTER — Other Ambulatory Visit: Payer: Self-pay

## 2022-05-10 DIAGNOSIS — M5416 Radiculopathy, lumbar region: Secondary | ICD-10-CM | POA: Insufficient documentation

## 2022-05-10 DIAGNOSIS — M1712 Unilateral primary osteoarthritis, left knee: Secondary | ICD-10-CM | POA: Insufficient documentation

## 2022-05-10 MED ORDER — LINACLOTIDE 290 MCG PO CAPS: ORAL_CAPSULE | ORAL | 0 refills | Status: DC

## 2022-05-10 MED ORDER — PANTOPRAZOLE SODIUM 40 MG PO TBEC: 40.0000 mg | DELAYED_RELEASE_TABLET | Freq: Every day | ORAL | 1 refills | Status: DC

## 2022-05-10 MED ORDER — HYDROXYZINE HCL 10 MG PO TABS: ORAL_TABLET | ORAL | 1 refills | Status: DC

## 2022-05-10 MED ORDER — TRIAMTERENE-HCTZ 37.5-25 MG PO TABS: 1.0000 | ORAL_TABLET | Freq: Every day | ORAL | 1 refills | Status: DC

## 2022-05-10 MED ORDER — LORATADINE 10 MG PO TABS: 10.0000 mg | ORAL_TABLET | Freq: Every day | ORAL | 1 refills | Status: DC

## 2022-05-10 MED ORDER — OXYCODONE HCL 5 MG PO TABS: 10.0000 mg | ORAL_TABLET | Freq: Two times a day (BID) | ORAL | 0 refills | Status: DC

## 2022-05-10 MED ORDER — MOUNJARO 5 MG/0.5ML ~~LOC~~ SOAJ: SUBCUTANEOUS | 2 refills | Status: DC

## 2022-05-10 MED ORDER — CLONIDINE HCL 0.1 MG PO TABS: ORAL_TABLET | ORAL | 1 refills | Status: DC

## 2022-05-10 MED ORDER — POTASSIUM CHLORIDE 20 MEQ/15ML (10%) PO SOLN: 20.0000 meq | Freq: Two times a day (BID) | ORAL | 1 refills | Status: AC

## 2022-05-10 MED ORDER — FLUTICASONE PROPIONATE 50 MCG/ACT NA SUSP: 1.0000 | Freq: Every day | NASAL | 2 refills | Status: DC

## 2022-05-10 MED ORDER — LUBIPROSTONE 24 MCG PO CAPS: ORAL_CAPSULE | ORAL | 0 refills | Status: DC
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
## 2022-05-10 NOTE — Telephone Encounter (Signed)
Refills sent to [id number]
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
## 2022-05-18 ENCOUNTER — Other Ambulatory Visit: Payer: Self-pay | Admitting: Family Medicine

## 2022-05-20 ENCOUNTER — Other Ambulatory Visit: Payer: Self-pay | Admitting: Family Medicine

## 2022-05-21 ENCOUNTER — Telehealth: Payer: Self-pay | Admitting: Internal Medicine

## 2022-05-21 NOTE — Telephone Encounter (Signed)
Patient called requesting refills of Amitiza and Linzess; however, they need a prior authorization for her insurance to cover.  If they deny it, are there any other medications she can substitute for these?  Any issues, please call patient and advise.  Thank you.

## 2022-05-21 NOTE — Telephone Encounter (Signed)
Called patient and let her know that it appeared that her Primary Care had sent in the refills of these medications.

## 2022-05-22 ENCOUNTER — Other Ambulatory Visit: Payer: Self-pay

## 2022-05-22 ENCOUNTER — Telehealth: Payer: Self-pay | Admitting: Family Medicine

## 2022-05-22 MED ORDER — LINACLOTIDE 290 MCG PO CAPS: ORAL_CAPSULE | ORAL | 0 refills | Status: DC

## 2022-05-22 MED ORDER — OLOPATADINE HCL 0.1 % OP SOLN: OPHTHALMIC | 12 refills | Status: DC

## 2022-05-22 MED ORDER — LUBIPROSTONE 24 MCG PO CAPS: 24.0000 ug | ORAL_CAPSULE | Freq: Two times a day (BID) | ORAL | 0 refills | Status: DC

## 2022-05-22 NOTE — Telephone Encounter (Signed)
[facility] and [facility] are no [facility] refills for amitiza or linzess from [place] or [facility]. I called [facility] and told them pt needed refills and would like to get them today and they are going to send the nurse a message

## 2022-05-22 NOTE — Telephone Encounter (Signed)
Brandi from [facility] called stating that Amitiza and Linzess were filled by [place] and is requesting they be sent in for patient. Please advise.

## 2022-05-22 NOTE — Telephone Encounter (Signed)
Reviewed medications again, Dr. Griffin Dakin CMA had sent both medications to [facility]. I have recent to [place]. Tried to contact patient, no answer, voicemail is full and unable to leave voice mail.

## 2022-05-22 NOTE — Telephone Encounter (Signed)
Patient called requested a call back to discuss the refill on Linzess further.

## 2022-05-22 NOTE — Telephone Encounter (Signed)
Pt called stating she is having an issue with getting her meds. She states gastro originally prescribed them but now Dr. Moshe Cipro has called in one of them. Can you please call her and let her explain this?

## 2022-05-22 NOTE — Addendum Note (Signed)
Addended by: Aleatha Borer on: 05/22/2022 11:50 AM   Modules accepted: Orders

## 2022-05-23 MED ORDER — PANTOPRAZOLE SODIUM 40 MG PO TBEC: 40.0000 mg | DELAYED_RELEASE_TABLET | Freq: Every day | ORAL | 1 refills | Status: DC

## 2022-05-23 NOTE — Telephone Encounter (Signed)
Called and spoke with patient, I advised I had sent in her medications yesterday. She advised that she had not heard from the [facility] [place] yet. I asked if Dr. Hilarie Fredrickson ws filling her Pantoprazole. She advised yes, so I have sent this medication in as well.

## 2022-05-23 NOTE — Addendum Note (Signed)
Addended by: Aleatha Borer on: 05/23/2022 11:14 AM   Modules accepted: Orders

## 2022-05-30 ENCOUNTER — Encounter (HOSPITAL_BASED_OUTPATIENT_CLINIC_OR_DEPARTMENT_OTHER): Payer: Medicare HMO | Admitting: Physical Medicine and Rehabilitation

## 2022-05-30 ENCOUNTER — Encounter: Payer: Self-pay | Admitting: Physical Medicine and Rehabilitation

## 2022-05-30 VITALS — BP 112/71 | HR 98 | Ht 66.0 in | Wt 172.4 lb

## 2022-05-30 DIAGNOSIS — M1712 Unilateral primary osteoarthritis, left knee: Secondary | ICD-10-CM

## 2022-05-30 DIAGNOSIS — M5416 Radiculopathy, lumbar region: Secondary | ICD-10-CM | POA: Diagnosis not present

## 2022-05-30 MED ORDER — BETAMETHASONE SOD PHOS & ACET 6 (3-3) MG/ML IJ SUSP: 6.0000 mg | Freq: Once | INTRAMUSCULAR | Status: DC

## 2022-05-30 MED ORDER — OXYCODONE HCL 5 MG PO TABS: 10.0000 mg | ORAL_TABLET | Freq: Two times a day (BID) | ORAL | 0 refills | Status: DC

## 2022-05-30 MED ORDER — LIDOCAINE HCL 1 % IJ SOLN: 3.0000 mL | Freq: Once | INTRAMUSCULAR | Status: DC

## 2022-05-30 NOTE — Telephone Encounter (Signed)
Patient is calling to follow up on Amitiza medication's PA

## 2022-05-30 NOTE — Telephone Encounter (Signed)
Called and spoke with patient. I have advised that we have not seen anything. She has advised that it is a form that her Insurance Personnel officer) is sending to be filled out for an over ride to be on both Amitiza and Linzess.

## 2022-05-30 NOTE — Addendum Note (Signed)
Addended by: Izora Ribas on: 05/30/2022 11:43 AM   Modules accepted: Orders

## 2022-05-30 NOTE — Progress Notes (Signed)
Knee injection, left  Indication: Left Knee pain not relieved by medication management and other conservative care.  Informed consent was obtained after describing risks and benefits of the procedure with the patient, this includes bleeding, bruising, infection and medication side effects. The patient wishes to proceed and has given written consent. The patient was placed in a recumbent position. The medial aspect of the knee was marked and prepped with Betadine and alcohol. It was then entered with a 25-gauge 1-1/2 inch needle and 1 mL of 1% lidocaine was injected into the skin and subcutaneous tissue.  After negative draw back for blood, a solution containing one ML of '6mg'$  per mL betamethasone and 3 mL of 1% lidocaine were injected. The patient tolerated the procedure well. Post procedure instructions were given.

## 2022-05-31 ENCOUNTER — Encounter: Payer: Medicare HMO | Admitting: Physical Medicine and Rehabilitation

## 2022-06-02 ENCOUNTER — Other Ambulatory Visit: Payer: Self-pay | Admitting: Family Medicine

## 2022-06-03 ENCOUNTER — Telehealth (HOSPITAL_BASED_OUTPATIENT_CLINIC_OR_DEPARTMENT_OTHER): Payer: Medicare HMO | Admitting: Physical Medicine and Rehabilitation

## 2022-06-03 ENCOUNTER — Other Ambulatory Visit (HOSPITAL_COMMUNITY): Payer: Self-pay

## 2022-06-03 ENCOUNTER — Telehealth: Payer: Self-pay

## 2022-06-03 DIAGNOSIS — M5416 Radiculopathy, lumbar region: Secondary | ICD-10-CM | POA: Diagnosis not present

## 2022-06-03 DIAGNOSIS — M25562 Pain in left knee: Secondary | ICD-10-CM

## 2022-06-03 MED ORDER — TOPIRAMATE 25 MG PO TABS: 25.0000 mg | ORAL_TABLET | Freq: Two times a day (BID) | ORAL | 11 refills | Status: DC

## 2022-06-03 NOTE — Telephone Encounter (Signed)
Patient Advocate Encounter  Prior Authorization for Amitiza 24MCG has been approved.   Effective: 06/03/2022 to 11/03/2022  Clista Bernhardt, CPhT Rx Patient Advocate Specialist Phone: [phone]

## 2022-06-03 NOTE — Telephone Encounter (Signed)
Mrs. Osterhout texted me this morning to let me know her pain has not gotten better and seems to have gotten worse and requested a call. 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 It is so painful that she can barely walk today. Reviewed the nerve agents she has failed, steroid injections she has failed, past steroid dose packs that provided no benefit. Decided to try Topamax '25mg'$  BID to help with her pain. She also has an appointment scheduled with NSGY in August, we had a recent lumbar MRI that showed worsening spinal stenosis that could be contributing to her current pain. She continues to apply ice and deep blue and take her oxycodone which is helping a little. She was barely able to sleep last night due to the pain. I have out in a communication for her to give her boss at work to say she is unable to work today and may be out for a few days this week.   10 minutes spent on phone with patient this morning discussing above.

## 2022-06-03 NOTE — Telephone Encounter (Signed)
Patient has been updated on current status of her prior authorizations

## 2022-06-03 NOTE — Telephone Encounter (Signed)
Patient Advocate Encounter   Received notification that prior authorization is required for Amitiza 24MCG (lubiprostone). Started PA via CoverMyMeds; unable to submit as another PA has been started. Contacted [employer] to fax over clinical questions. Forms indicate this is for Brand Amitiza. Filled out forms and returned with chart notes to [facility] on 06/03/2022.  Clista Bernhardt, CPhT Rx Patient Advocate Specialist Phone: [phone]

## 2022-06-05 ENCOUNTER — Other Ambulatory Visit: Payer: Self-pay

## 2022-06-05 ENCOUNTER — Emergency Department (HOSPITAL_COMMUNITY): Payer: Medicare HMO

## 2022-06-05 ENCOUNTER — Emergency Department (HOSPITAL_COMMUNITY)
Admission: EM | Admit: 2022-06-05 | Discharge: 2022-06-05 | Disposition: A | Discharge status: Home / Self Care | Payer: Medicare HMO | Attending: Emergency Medicine | Admitting: Emergency Medicine

## 2022-06-05 ENCOUNTER — Encounter (HOSPITAL_COMMUNITY): Payer: Self-pay

## 2022-06-05 DIAGNOSIS — M25562 Pain in left knee: Secondary | ICD-10-CM | POA: Diagnosis not present

## 2022-06-05 DIAGNOSIS — Z91040 Latex allergy status: Secondary | ICD-10-CM | POA: Diagnosis not present

## 2022-06-05 DIAGNOSIS — M1712 Unilateral primary osteoarthritis, left knee: Secondary | ICD-10-CM | POA: Diagnosis not present

## 2022-06-05 DIAGNOSIS — G8929 Other chronic pain: Secondary | ICD-10-CM | POA: Insufficient documentation

## 2022-06-05 LAB — CBC WITH DIFFERENTIAL/PLATELET
Abs Immature Granulocytes: 0.01 10*3/uL (ref 0.00–0.07)
Basophils Absolute: 0.1 10*3/uL (ref 0.0–0.1)
Basophils Relative: 1 %
Eosinophils Absolute: 0.8 10*3/uL — ABNORMAL HIGH (ref 0.0–0.5)
Eosinophils Relative: 11 %
HCT: 38.9 % (ref 36.0–46.0)
Hemoglobin: 13.2 g/dL (ref 12.0–15.0)
Immature Granulocytes: 0 %
Lymphocytes Relative: 39 %
Lymphs Abs: 3 10*3/uL (ref 0.7–4.0)
MCH: 31.6 pg (ref 26.0–34.0)
MCHC: 33.9 g/dL (ref 30.0–36.0)
MCV: 93.1 fL (ref 80.0–100.0)
Monocytes Absolute: 0.6 10*3/uL (ref 0.1–1.0)
Monocytes Relative: 8 %
Neutro Abs: 3.3 10*3/uL (ref 1.7–7.7)
Neutrophils Relative %: 41 %
Platelets: 265 10*3/uL (ref 150–400)
RBC: 4.18 MIL/uL (ref 3.87–5.11)
RDW: 13.6 % (ref 11.5–15.5)
WBC: 7.7 10*3/uL (ref 4.0–10.5)
nRBC: 0 % (ref 0.0–0.2)

## 2022-06-05 LAB — BASIC METABOLIC PANEL
Anion gap: 8 (ref 5–15)
BUN: 22 mg/dL — ABNORMAL HIGH (ref 6–20)
CO2: 24 mmol/L (ref 22–32)
Calcium: 9.3 mg/dL (ref 8.9–10.3)
Chloride: 109 mmol/L (ref 98–111)
Creatinine, Ser: 1.06 mg/dL — ABNORMAL HIGH (ref 0.44–1.00)
GFR, Estimated: >60 mL/min (ref >60)
Glucose, Bld: 78 mg/dL (ref 70–99)
Potassium: 3.3 mmol/L — ABNORMAL LOW (ref 3.5–5.1)
Sodium: 141 mmol/L (ref 135–145)

## 2022-06-05 MED ORDER — KETOROLAC TROMETHAMINE 30 MG/ML IJ SOLN
30.0000 mg | Freq: Once | INTRAMUSCULAR | Status: AC
  Administered 2022-06-05: 30 mg via INTRAMUSCULAR
  Filled 2022-06-05: qty 1

## 2022-06-05 NOTE — ED Provider Triage Note (Signed)
Emergency Medicine Provider Triage Evaluation Note  Jane English , a 51 y.o. female  was evaluated in triage.  Pt complains of left knee swelling.  Reports this is a chronic issue.  A couple days ago she saw her PCP who put steroid injections into her knee.  She says that it has not helped her at all.  She sent her PCP photos who told her to come to the emergency department for potential "pain injections."  Review of Systems  Positive: Knee pain, trouble with ambulation Negative: Numbness or tingling  Physical Exam  BP 125/83   Pulse 97   Temp 98.2 F (36.8 C) (Oral)   Resp 16   Ht '5\' 6"'$  (1.676 m)   Wt 79.8 kg   SpO2 99%   BMI 28.41 kg/m  Gen:   Awake, no distress   Resp:  Normal effort  MSK:   Moves extremities without difficulty  Other:  Full range of motion of the left knee.  Strong and intact DP pulse.  No impressive swelling, cellulitis.  Medical Decision Making  Medically screening exam initiated at 8:24 PM.  Appropriate orders placed.  Jane English was informed that the remainder of the evaluation will be completed by another provider, this initial triage assessment does not replace that evaluation, and the importance of remaining in the ED until their evaluation is complete.     Jane Hammock, PA-C 06/05/22 2025

## 2022-06-05 NOTE — ED Triage Notes (Signed)
Pt arrive with c/o left knee swelling after she had injections for chronic knee pain. Per pt, after injections knee has become more swollen, painful, and warm to the touch. Pt unsure if she has had fevers.

## 2022-06-05 NOTE — Discharge Instructions (Signed)
Continue your usual pain medication. Can follow-up with Dr. Erlinda Hong-- call for appt. Return here for new concerns.

## 2022-06-05 NOTE — ED Provider Notes (Signed)
[facility] DEPT Provider Note   CSN: [id number] Arrival date & time: 06/05/22  1943     History  Chief Complaint  Patient presents with   Joint Swelling    Jane English is a 51 y.o. female.  The history is provided by the patient and medical records.   51 year old female with history of chronic back and leg pain, GERD, migraine headaches, anxiety, presenting to the ED with left knee pain.  She has been having issues with this knee for the past several months.  She is currently enrolled in pain management and received 3 steroid injections in her left knee just a few days ago.  States she has not had any improvement and now is have more difficulty walking.  She reports some increased swelling but denies any redness, warmth to touch, or generalized fevers.  She has not had any falls or new trauma.  States she was talking to her pain management doctor about this all day today and encouraged her to come to the ER for evaluation.  Home Medications Prior to Admission medications   Medication Sig Start Date End Date Taking? Authorizing Provider  albuterol (PROVENTIL) (2.5 MG/3ML) 0.083% nebulizer solution Take 3 mLs (2.5 mg total) by nebulization every 6 (six) hours as needed for wheezing or shortness of breath. 04/18/22   Fayrene Helper, MD  albuterol (VENTOLIN HFA) 108 (90 Base) MCG/ACT inhaler TAKE 2 PUFFS BY MOUTH EVERY 6 HOURS AS NEEDED FOR WHEEZE OR SHORTNESS OF BREATH 08/08/21   Fayrene Helper, MD  ALPRAZolam Duanne Moron) 0.25 MG tablet Take half tablet by mouth at bedtime for sleep and anxiety 05/14/21   Fayrene Helper, MD  amitriptyline (ELAVIL) 10 MG tablet TAKE 1 TABLET BY MOUTH EVERYDAY AT BEDTIME 04/03/22   Raulkar, Clide Deutscher, MD  beclomethasone (QVAR REDIHALER) 40 MCG/ACT inhaler Inhale 2 puffs into the lungs 2 (two) times daily. 12/14/19   Fayrene Helper, MD  blood glucose meter kit and supplies Dispense based on patient and insurance  preference. Once daily testing DX E11.9 04/09/22   Fayrene Helper, MD  BREO ELLIPTA 100-25 MCG/ACT AEPB INHALE 1 PUFF INTO THE LUNGS TWICE A DAY 11/26/21   Fayrene Helper, MD  cloNIDine (CATAPRES) 0.1 MG tablet TAKE 1 TABLET BY MOUTH EVERYDAY AT BEDTIME 05/10/22   Fayrene Helper, MD  clotrimazole-betamethasone (LOTRISONE) cream Apply 1 application. topically 2 (two) times daily. 03/21/22   Fayrene Helper, MD  cyclobenzaprine (FLEXERIL) 5 MG tablet Take 1 tablet (5 mg total) by mouth 3 (three) times daily as needed for muscle spasms. 03/12/21   Raulkar, Clide Deutscher, MD  diclofenac Sodium (VOLTAREN) 1 % GEL APPLY 2 GRAMS TO AFFECTED AREA 4 TIMES A DAY 01/05/21   Raulkar, Clide Deutscher, MD  EPINEPHrine 0.3 mg/0.3 mL IJ SOAJ injection as needed. 06/23/20   [provider]  ergocalciferol (VITAMIN D2) 1.25 MG (50000 UT) capsule Take 1 capsule (50,000 Units total) by mouth once a week. One capsule once weekly 03/26/22   Fayrene Helper, MD  fexofenadine-pseudoephedrine (ALLEGRA-D 24) 180-240 MG 24 hr tablet Take 1 tablet by mouth daily.    [provider]  fluticasone (FLONASE) 50 MCG/ACT nasal spray Place 1-2 sprays into both nostrils daily. 05/10/22   Fayrene Helper, MD  glucose blood test strip Use as instructed once daily dx e11.9 04/09/22   Fayrene Helper, MD  hydrOXYzine (ATARAX) 10 MG tablet TAKE ONE TABLET BY MOUTH THREE TIMES  DAILY FOR ANXIETY 06/03/22   Fayrene Helper, MD  ipratropium (ATROVENT) 0.03 % nasal spray Place 2 sprays into both nostrils every 12 (twelve) hours. 10/04/21   Perlie Mayo, NP  Lancets 30G MISC Once daily testing dx e11.9 04/09/22   Fayrene Helper, MD  lidocaine (XYLOCAINE) 2 % jelly  08/31/20   [provider]  linaclotide (LINZESS) 290 MCG CAPS capsule TAKE 1 CAPSULE (290 [place]) BY MOUTH DAILY BEFORE BREAKFAST. 05/22/22   Pyrtle, Lajuan Lines, MD  loratadine (CLARITIN) 10 MG tablet Take 1 tablet (10 mg total) by mouth daily.  05/10/22   Fayrene Helper, MD  lubiprostone (AMITIZA) 24 MCG capsule Take 1 capsule (24 mcg total) by mouth 2 (two) times daily with a meal. 05/22/22   Pyrtle, Lajuan Lines, MD  Magnesium Gluconate 250 MG TABS Take 1 tablet (250 mg total) by mouth at bedtime. 07/12/21   Raulkar, Clide Deutscher, MD  magnesium oxide (MAG-OX) 400 MG tablet TAKE 1 TABLET BY MOUTH EVERYDAY AT BEDTIME 12/25/20   Raulkar, Clide Deutscher, MD  meloxicam (MOBIC) 7.5 MG tablet Take 1 tablet (7.5 mg total) by mouth 2 (two) times daily as needed for pain. 03/19/22   Raulkar, Clide Deutscher, MD  methocarbamol (ROBAXIN) 500 MG tablet Take 1 tablet (500 mg total) by mouth 4 (four) times daily. 03/19/22   Raulkar, Clide Deutscher, MD  montelukast (SINGULAIR) 10 MG tablet TAKE 1 TABLET BY MOUTH EVERYDAY AT BEDTIME 06/28/20   Fayrene Helper, MD  [facility] 4 MG/0.1ML LIQD nasal spray kit Place 4 mg into the nose as directed. 03/10/18   [provider]  NONFORMULARY OR COMPOUNDED ITEM Apply 1-2 pumps, 3-4 times daily as needed. 01/20/20   Bo Merino, MD  olopatadine (PATANOL) 0.1 % ophthalmic solution INSTILL 1 DROP INTO BOTH EYES TWICE A DAY (OTC) 05/22/22   Fayrene Helper, MD  ondansetron (ZOFRAN) 4 MG tablet Take 1 tablet (4 mg total) by mouth every 8 (eight) hours as needed for nausea or vomiting. 05/02/22   Raulkar, Clide Deutscher, MD  oxybutynin (DITROPAN-XL) 10 MG 24 hr tablet TAKE 1 TABLET BY MOUTH EVERYDAY AT BEDTIME 02/27/22   Fayrene Helper, MD  oxyCODONE (ROXICODONE) 5 MG immediate release tablet Take 2 tablets (10 mg total) by mouth 2 (two) times daily. 05/30/22   Raulkar, Clide Deutscher, MD  pantoprazole (PROTONIX) 40 MG tablet Take 1 tablet (40 mg total) by mouth daily. 05/23/22   Pyrtle, Lajuan Lines, MD  phentermine (ADIPEX-P) 37.5 MG tablet TAKE HALF A TBALET BY MOUTH EVERY MORNING WITH BREAKFAST 02/27/22   Fayrene Helper, MD  potassium chloride 20 MEQ/15ML (10%) SOLN Take 15 mLs (20 mEq total) by mouth 2 (two) times daily. 05/10/22   Fayrene Helper, MD  predniSONE (DELTASONE) 10 MG tablet Take 1 tablet (10 mg total) by mouth 2 (two) times daily with a meal. 11/30/21   Fayrene Helper, MD  promethazine-dextromethorphan (PROMETHAZINE-DM) 6.25-15 MG/5ML syrup Take one teaspoon at bedtime as needed, for excessive cough 11/30/21   Fayrene Helper, MD  [facility] injection  06/27/21   [provider]  solifenacin (VESICARE) 5 MG tablet TAKE 1 TABLET (5 MG TOTAL) BY MOUTH DAILY. 02/27/22   Fayrene Helper, MD  Tiotropium Bromide Monohydrate (SPIRIVA RESPIMAT) 1.25 MCG/ACT AERS Inhale 2 puffs into the lungs daily. 03/16/20   Garnet Sierras, DO  tirzepatide [facility]) 5 MG/0.5ML Pen INJECT 5 MG SUBCUTANEOUSLY WEEKLY (NOT COVERED) 05/10/22   Moshe Cipro,  Norwood Levo, MD  topiramate (TOPAMAX) 25 MG tablet Take 1 tablet (25 mg total) by mouth 2 (two) times daily. 06/03/22   Raulkar, Clide Deutscher, MD  triamterene-hydrochlorothiazide (MAXZIDE-25) 37.5-25 MG tablet Take 1 tablet by mouth daily. 05/10/22   Fayrene Helper, MD  trolamine salicylate (ASPERCREME) 10 % cream Apply 1 application topically as needed for muscle pain.     [provider]      Allergies    Neurontin [gabapentin], Penicillins, Pregabalin, Tramadol, and Latex    Review of Systems   Review of Systems  Musculoskeletal:  Positive for arthralgias.  All other systems reviewed and are negative.   Physical Exam Updated Vital Signs BP 138/79   Pulse 99   Temp 98.2 F (36.8 C) (Oral)   Resp 16   Ht 5' 6" (1.676 m)   Wt 79.8 kg   SpO2 100%   BMI 28.41 kg/m   Physical Exam Vitals and nursing note reviewed.  Constitutional:      Appearance: She is well-developed.  HENT:     Head: Normocephalic and atraumatic.  Eyes:     Conjunctiva/sclera: Conjunctivae normal.     Pupils: Pupils are equal, round, and reactive to light.  Cardiovascular:     Rate and Rhythm: Normal rate and regular rhythm.     Heart sounds: Normal heart sounds.  Pulmonary:      Effort: Pulmonary effort is normal.     Breath sounds: Normal breath sounds.  Abdominal:     General: Bowel sounds are normal.     Palpations: Abdomen is soft.  Musculoskeletal:        General: Normal range of motion.     Cervical back: Normal range of motion.     Comments: Left knee with minimal swelling, there is some bruising noted to lateral knee from prior injections, no erythema, induration, or tissue crepitus, able to flex/extend, DP pulse itnact  Skin:    General: Skin is warm and dry.  Neurological:     Mental Status: She is alert and oriented to person, place, and time.     ED Results / Procedures / Treatments   Labs (all labs ordered are listed, but only abnormal results are displayed) Labs Reviewed  CBC WITH DIFFERENTIAL/PLATELET - Abnormal; Notable for the following components:      Result Value   Eosinophils Absolute 0.8 (*)    All other components within normal limits  BASIC METABOLIC PANEL - Abnormal; Notable for the following components:   Potassium 3.3 (*)    BUN 22 (*)    Creatinine, Ser 1.06 (*)    All other components within normal limits    EKG None  Radiology DG Knee Complete 4 Views Left  Result Date: 06/05/2022 CLINICAL DATA:  Left knee swelling following injections for chronic knee pain. EXAM: LEFT KNEE - COMPLETE 4+ VIEW COMPARISON:  01/11/2021. FINDINGS: No evidence of fracture, dislocation, or joint effusion. Mild tricompartmental degenerative changes are noted. Enthesopathic changes are noted at the patella and tibial tuberosity. Soft tissues are unremarkable. IMPRESSION: 1. No acute osseous abnormality. 2. Mild tricompartmental degenerative changes. Electronically Signed   By: Brett Fairy M.D.   On: 06/05/2022 20:42    Procedures Procedures    Medications Ordered in ED Medications  ketorolac (TORADOL) 30 MG/ML injection 30 mg (30 mg Intramuscular Given 06/05/22 2224)    ED Course/ Medical Decision Making/ A&P  Medical Decision Making Risk Prescription drug management.   51 year old female presenting to the ED with chronic left knee pain.  Currently enrolled in pain management and received a steroid injection a few days ago without much change.  States she is not sure what else to do.  She has been taking her chronic oxycodone without change.  She is afebrile, nontoxic.  Her left knee has minimal swelling but there is no erythema, induration, warmth to touch of the knee.  She is able to flex and extend.  Leg is neurovascular intact.  She has bruising along the lateral joint line from prior injections but this appears old.  No signs of superimposed infection or cellulitis.  Clinically not consistent with septic joint.  Labs are reassuring today without leukocytosis or electrolyte derangement.  X-ray with degenerative changes but no acute findings.  Discussed with patient limited intervention from the ER, she is already in pain management so will not prescribe further narcotics.  Will refer to orthopedics for further evaluation if desired.  Can follow-up with PCP.  Return here for new concerns.  Final Clinical Impression(s) / ED Diagnoses Final diagnoses:  Chronic pain of left knee    Rx / DC Orders ED Discharge Orders     None         Larene Pickett, PA-C 06/05/22 2310    Drenda Freeze, MD 06/05/22 [phone]

## 2022-06-06 ENCOUNTER — Other Ambulatory Visit: Payer: Self-pay | Admitting: Physical Medicine and Rehabilitation

## 2022-06-06 ENCOUNTER — Other Ambulatory Visit: Payer: Self-pay | Admitting: Family Medicine

## 2022-06-06 ENCOUNTER — Ambulatory Visit: Payer: Medicare HMO | Admitting: Physical Medicine and Rehabilitation

## 2022-06-06 MED ORDER — OXYCODONE HCL 5 MG PO TABS: 10.0000 mg | ORAL_TABLET | Freq: Two times a day (BID) | ORAL | 0 refills | Status: DC

## 2022-06-10 ENCOUNTER — Encounter: Payer: Medicare HMO | Admitting: Physical Medicine and Rehabilitation

## 2022-06-11 ENCOUNTER — Other Ambulatory Visit: Payer: Self-pay | Admitting: Family Medicine

## 2022-06-12 ENCOUNTER — Other Ambulatory Visit: Payer: Self-pay

## 2022-06-12 ENCOUNTER — Ambulatory Visit (INDEPENDENT_AMBULATORY_CARE_PROVIDER_SITE_OTHER): Payer: Medicare HMO | Admitting: Orthopaedic Surgery

## 2022-06-12 ENCOUNTER — Telehealth: Payer: Self-pay | Admitting: Radiology

## 2022-06-12 ENCOUNTER — Ambulatory Visit (HOSPITAL_COMMUNITY)
Admission: RE | Admit: 2022-06-12 | Discharge: 2022-06-12 | Disposition: A | Discharge status: Home / Self Care | Payer: Medicare HMO | Source: Ambulatory Visit | Attending: Orthopaedic Surgery | Admitting: Orthopaedic Surgery

## 2022-06-12 DIAGNOSIS — G8929 Other chronic pain: Secondary | ICD-10-CM

## 2022-06-12 DIAGNOSIS — M25562 Pain in left knee: Secondary | ICD-10-CM | POA: Insufficient documentation

## 2022-06-12 NOTE — Progress Notes (Signed)
Left lower extremity venous duplex has been completed. Preliminary results can be found in CV Proc through chart review.  Results were given to [facility] at Dr. Phoebe Sharps office.  06/12/22 1:24 PM Jane English RVT

## 2022-06-12 NOTE — Progress Notes (Signed)
Office Visit Note   Patient: Jane English           Date of Birth: 1971-01-20           MRN: [id number] Visit Date: 06/12/2022              Requested by: Fayrene Helper, [place], [place],   [id number] PCP: Fayrene Helper, MD   Assessment & Plan: Visit Diagnoses:  1. Chronic pain of left knee     Plan: Impression is left knee/calf pain concerning for DVT.  I would like to go ahead and order a venous Doppler ultrasound to rule this out.  Should her Doppler come back negative, would like to obtain an MRI to assess for lateral meniscus tear.  She has been instructed to go to the ED should she develop any chest pain or shortness of breath.  She will follow-up with [place] as needed.  Call with concerns or questions.  Follow-Up Instructions: No follow-ups on file.   Orders:  No orders of the defined types were placed in this encounter.  No orders of the defined types were placed in this encounter.     Procedures: No procedures performed   Clinical Data: No additional findings.   Subjective: Chief Complaint  Patient presents with   Left Knee - Pain    HPI patient is a pleasant 51 year old female who comes in today with left knee and calf pain for the past month and a half.  She denies any injury or change in activity but notes her symptoms have worsened over time.  Pain is primarily to the calf but has started to radiate into the knee.  She also notes occasional pain into the anterior thigh.  No groin pain.  She is having to walk on her tiptoes as it hurts to walk with a normal gait.  She has been using a brace and muscle rub without relief.  She has been working on guided exercises since the onset of symptoms without relief.  She is on chronic oxycodone.  She recently underwent cortisone injection to the left knee which provided minimal and temporary relief.  No fevers or chills.  She denies any personal or family history of DVT or PE.  She is a  non-smoker.  She is not on anticoagulants and she is not on oral contraceptives.  No chest pain or shortness of breath.  Review of Systems as detailed in HPI.  All others reviewed and are negative.   Objective: Vital Signs: There were no vitals taken for this visit.  Physical Exam well-developed well-nourished female no acute distress.  Alert and oriented x 3.  Ortho Exam left knee exam shows a trace effusion.  Range of motion 0 to 90 degrees.  She does have lateral joint line tenderness with majority of her tenderness is to the proximal calf.  She does have a positive Homans.  No pain with logroll or FADIR.  She is neurovascular intact distally.  Specialty Comments:  No specialty comments available.  Imaging: X-rays reviewed by me in [place] reveal mild tricompartmental degenerative changes   PMFS History: Patient Active Problem List   Diagnosis Date Noted   Type 2 diabetes mellitus with other specified complication ([place]) 08/67/6195   Annual visit for general adult medical examination with abnormal findings 03/21/2022   Anxiety 12/16/2020   Left foot pain 05/01/2020   Great toe pain, left 05/01/2020   Back spasm 04/10/2020  Not well controlled severe persistent asthma 02/03/2020   Urinary urgency 08/11/2019   GAD (generalized anxiety disorder) 04/26/2019   Arthralgia of left shoulder region 11/05/2018   Depression, major, single episode, severe ([place]) 10/03/2018   Vitamin D deficiency 12/14/2017   Bilateral ankle pain 04/16/2017   Amenorrhea 10/15/2016   Paresthesia 05/06/2016   Back pain with radiation 04/04/2016   Obesity (BMI 30.0-34.9) 03/25/2016   Neuropathic pain of finger of right hand 12/31/2015   Pap smear of cervix shows high risk HPV present 08/08/2015   Annual physical exam 08/04/2015   Acute bronchitis 05/28/2015   Primary osteoarthritis of right knee 03/17/2015   Cyst of left ovary 02/06/2015   Anovulation 02/06/2015   Secondary amenorrhea 02/06/2015    Intractable chronic migraine without aura and without status migrainosus 12/21/2014   Migraine 09/21/2014   Heart murmur 05/13/2014   Abdominal adhesions 02/15/2014   Anemia 10/11/2013   H. pylori duodenitis 07/07/2013   PTSD (post-traumatic stress disorder) 03/26/2013   Insomnia 03/18/2013   Seasonal and perennial allergic rhinoconjunctivitis 12/10/2012   GERD (gastroesophageal reflux disease) 10/29/2012   Chronic pain syndrome 10/06/2012   Postlaminectomy syndrome 10/06/2012   Cervical neck pain with evidence of disc disease 04/13/2012   Allergic rhinitis 04/17/2010   Cough variant asthma 03/04/2010   Prediabetes 01/05/2009   Essential hypertension 11/27/2007   Past Medical History:  Diagnosis Date   Anemia    Asthma    Asthma flare 04/09/2013   Back pain    Bronchitis    Chronic abdominal pain    Chronic constipation    Constipation due to opioid therapy    Depression    Depression, major, single episode, severe ([place]) 10/03/2018   PHQ 9 score of 15   Diabetes mellitus without complication ([place])    Diabetes mellitus, type II ([place])    DVT (deep venous thrombosis) ([place]) 2010   GERD (gastroesophageal reflux disease)    Heart murmur    no cardiologist   Helicobacter pylori gastritis 06/11/2013   Colonoscopy Dr. Hilarie Fredrickson   Hypertension    IBS (irritable bowel syndrome)    Migraine headache    Neuropathy    Obesity    Obsessive-compulsive disorder    PSYCHOTIC D/O W/HALLUCINATIONS CONDS CLASS ELSW 03/04/2010   Qualifier: Diagnosis of  By: Moshe Cipro MD, Margaret     PTSD (post-traumatic stress disorder)    SBO (small bowel obstruction) ([place]) 08/09/2013   Seasonal allergies 12/10/2012   Seizures ([place])    Shortness of breath     Family History  Problem Relation Age of Onset   Physical abuse Mother    Alcohol abuse Mother    Cirrhosis Mother    Lung cancer Father    Stomach cancer Father    Esophageal cancer Father    Alcohol abuse Father    Mental illness Father    Diabetes  Sister    Hypertension Sister    Bipolar disorder Sister    Schizophrenia Sister    Diabetes Sister    Drug abuse Sister    HIV Sister    Pneumonia Sister        died as a baby   Alcohol abuse Brother    Hypertension Brother    Kidney disease Brother    Diabetes Brother    Drug abuse Brother    Mental illness Brother    Alcohol abuse Brother    Alcohol abuse Brother    Hypertension Brother    Diabetes Brother  Alcohol abuse Brother    Mental illness Brother        in [place]   Alcohol abuse Brother    Alcohol abuse Brother    Bipolar disorder Brother    Hypertension Brother    Bipolar disorder Brother    Drug abuse Brother    Alcohol abuse Brother    Bipolar disorder Brother    Bipolar disorder Daughter    Bipolar disorder Son    Bipolar disorder Son    ADD / ADHD Neg Hx    Anxiety disorder Neg Hx    Dementia Neg Hx    Depression Neg Hx    OCD Neg Hx    Seizures Neg Hx    Paranoid behavior Neg Hx    Colon cancer Neg Hx    Rectal cancer Neg Hx     Past Surgical History:  Procedure Laterality Date   ANTERIOR CERVICAL DECOMP/DISCECTOMY FUSION  07/07/2012   Procedure: ANTERIOR CERVICAL DECOMPRESSION/DISCECTOMY FUSION 2 LEVELS;  Surgeon: Floyce Stakes, MD;  Location: [facility];  Service: Neurosurgery;  Laterality: N/A;  Cervical four-five, five - six  Anterior cervical decompression/diskectomy/fusion/plate   APPENDECTOMY  1986   BOWEL RESECTION N/A 07/29/2013   Procedure: serosal repair;  Surgeon: Adin Hector, MD;  Location: [facility];  Service: General;  Laterality: N/A;   CARPAL TUNNEL RELEASE Bilateral    COLON SURGERY N/A    [employer] 07/29/2020   LAPAROSCOPY N/A 07/29/2013   Procedure: diagnostic laporoscopy;  Surgeon: Adin Hector, MD;  Location: [facility];  Service: General;  Laterality: N/A;   LAPAROSCOPY N/A 08/16/2013   Procedure: LAPAROSCOPY DIAGNOSTIC/LYSIS OF ADHESIONS;  Surgeon: Adin Hector, MD;  Location: [facility];  Service: General;  Laterality:  N/A;   LAPAROTOMY N/A 08/16/2013   Procedure: EXPLORATORY LAPAROTOMY/SMALL BOWEL RESECTION (JEJUNUM);  Surgeon: Adin Hector, MD;  Location: [facility];  Service: General;  Laterality: N/A;   LUMBAR SPINE SURGERY  2010   x 3   LYSIS OF ADHESION  2003   Dr. Irving Shows   LYSIS OF ADHESION N/A 07/29/2013   Procedure: LYSIS OF ADHESION;  Surgeon: Adin Hector, MD;  Location: [facility];  Service: General;  Laterality: N/A;   [place]?   Linna Hoff, [place]   SPINAL CORD STIMULATOR IMPLANT     SPINE SURGERY N/A    [employer] 07/29/2020   TRIGGER FINGER RELEASE  2009   right pinkie finger   TUBAL LIGATION  1994   Social History   Occupational History   Occupation: disabled  Tobacco Use   Smoking status: Never   Smokeless tobacco: Never  Vaping Use   Vaping Use: Never used  Substance and Sexual Activity   Alcohol use: No   Drug use: No   Sexual activity: Not Currently    Partners: Male    Birth control/protection: Surgical    Comment: tubal

## 2022-06-12 NOTE — Telephone Encounter (Signed)
Jane English [facility], states that [name] is negative for DVT LLE. Please advise.

## 2022-06-17 ENCOUNTER — Other Ambulatory Visit: Payer: Self-pay | Admitting: Family Medicine

## 2022-06-18 ENCOUNTER — Other Ambulatory Visit: Payer: Self-pay

## 2022-06-18 DIAGNOSIS — G8929 Other chronic pain: Secondary | ICD-10-CM

## 2022-06-19 ENCOUNTER — Ambulatory Visit: Payer: Medicare HMO | Admitting: Physician Assistant

## 2022-06-20 ENCOUNTER — Telehealth: Payer: Self-pay | Admitting: Family Medicine

## 2022-06-20 ENCOUNTER — Ambulatory Visit (INDEPENDENT_AMBULATORY_CARE_PROVIDER_SITE_OTHER): Payer: Medicare HMO | Admitting: Family Medicine

## 2022-06-20 ENCOUNTER — Encounter: Payer: Self-pay | Admitting: Family Medicine

## 2022-06-20 ENCOUNTER — Other Ambulatory Visit: Payer: Self-pay

## 2022-06-20 VITALS — BP 103/69 | HR 90 | Ht 66.0 in | Wt 169.1 lb

## 2022-06-20 DIAGNOSIS — G894 Chronic pain syndrome: Secondary | ICD-10-CM

## 2022-06-20 DIAGNOSIS — Z13220 Encounter for screening for lipoid disorders: Secondary | ICD-10-CM | POA: Diagnosis not present

## 2022-06-20 DIAGNOSIS — E1169 Type 2 diabetes mellitus with other specified complication: Secondary | ICD-10-CM

## 2022-06-20 DIAGNOSIS — E663 Overweight: Secondary | ICD-10-CM | POA: Diagnosis not present

## 2022-06-20 DIAGNOSIS — H1013 Acute atopic conjunctivitis, bilateral: Secondary | ICD-10-CM

## 2022-06-20 DIAGNOSIS — Z23 Encounter for immunization: Secondary | ICD-10-CM

## 2022-06-20 DIAGNOSIS — I10 Essential (primary) hypertension: Secondary | ICD-10-CM | POA: Diagnosis not present

## 2022-06-20 DIAGNOSIS — J45991 Cough variant asthma: Secondary | ICD-10-CM

## 2022-06-20 DIAGNOSIS — G43719 Chronic migraine without aura, intractable, without status migrainosus: Secondary | ICD-10-CM | POA: Diagnosis not present

## 2022-06-20 DIAGNOSIS — R3915 Urgency of urination: Secondary | ICD-10-CM

## 2022-06-20 MED ORDER — OLOPATADINE HCL 0.1 % OP SOLN: OPHTHALMIC | 12 refills | Status: DC

## 2022-06-20 NOTE — Patient Instructions (Addendum)
F//u in mid November , call if you need me sooner  STOP triamrterene, bloosd pressure excellent  CONGRATS on healthy habits and weight loss    HBA1C, fasting lipid, cmp and eGFR 3 days before visit  Nurse pls refill eye drops   It is important that you exercise regularly at least 30 minutes 5 times a week. If you develop chest pain, have severe difficulty breathing, or feel very tired, stop exercising immediately and seek medical attention   Thanks for choosing [facility], we consider it a privelige to serve you.

## 2022-06-20 NOTE — Telephone Encounter (Signed)
error 

## 2022-06-21 ENCOUNTER — Other Ambulatory Visit: Payer: Self-pay | Admitting: Family Medicine

## 2022-06-22 DIAGNOSIS — J45909 Unspecified asthma, uncomplicated: Secondary | ICD-10-CM | POA: Diagnosis not present

## 2022-06-23 ENCOUNTER — Encounter: Payer: Self-pay | Admitting: Family Medicine

## 2022-06-23 NOTE — Assessment & Plan Note (Signed)
Ms. Jane English is reminded of the importance of commitment to daily physical activity for 30 minutes or more, as able and the need to limit carbohydrate intake to 30 to 60 grams per meal to help with blood sugar control.   The need to take medication as prescribed, test blood sugar as directed, and to call between visits if there is a concern that blood sugar is uncontrolled is also discussed.   Ms. Jane English is reminded of the importance of daily foot exam, annual eye examination, and good blood sugar, blood pressure and cholesterol control.     Latest Ref Rng & Units 06/05/2022    8:45 PM 03/21/2022   12:13 PM 05/14/2021    3:47 PM 04/06/2020    4:30 PM 01/04/2020    8:54 AM  Diabetic Labs  HbA1c 4.8 - 5.6 %  6.7  6.5  6.3  6.4   Chol 100 - 199 mg/dL  172  187     HDL >39 mg/dL  50  53     Calc LDL 0 - 99 mg/dL  94  104     Triglycerides 0 - 149 mg/dL  165  172     Creatinine 0.44 - 1.00 mg/dL 1.06  1.04  0.95  1.05        06/20/2022    9:04 AM 06/05/2022   11:00 PM 06/05/2022   10:45 PM 06/05/2022   10:30 PM 06/05/2022   10:15 PM 06/05/2022   10:00 PM 06/05/2022    7:52 PM  BP/Weight  Systolic BP 342 876 811 572 620 355   Diastolic BP 69 80 73 79 92 73   Wt. (Lbs) 169.12      176  BMI 27.3 kg/m2      28.41 kg/m2      Latest Ref Rng & Units 03/21/2022   10:40 AM 02/26/2022   12:00 AM  Foot/eye exam completion dates  Eye Exam No Retinopathy  No Retinopathy      Foot Form Completion  Done      This result is from an external source.      Updated lab needed at/ before next visit.

## 2022-06-23 NOTE — Assessment & Plan Note (Signed)
Managed by pain clinic 

## 2022-06-23 NOTE — Assessment & Plan Note (Signed)
Controlled, no change in medication  

## 2022-06-23 NOTE — Assessment & Plan Note (Signed)
Improved  Patient re-educated about  the importance of commitment to a  minimum of 150 minutes of exercise per week as able.  The importance of healthy food choices with portion control discussed, as well as eating regularly and within a 12 hour window most days. The need to choose "clean , green" food 50 to 75% of the time is discussed, as well as to make water the primary drink and set a goal of 64 ounces water daily.       06/20/2022    9:04 AM 06/05/2022    7:52 PM 05/30/2022   11:18 AM  Weight /BMI  Weight 169 lb 1.9 oz 176 lb 172 lb 6.4 oz  Height '5\' 6"'$  (1.676 m) '5\' 6"'$  (1.676 m) '5\' 6"'$  (1.676 m)  BMI 27.3 kg/m2 28.41 kg/m2 27.83 kg/m2

## 2022-06-23 NOTE — Assessment & Plan Note (Signed)
Increased and uncontrolled symptoms, patanol prescribed

## 2022-06-23 NOTE — Progress Notes (Signed)
Jane English     MRN: [id number]      DOB: Sep 02, 1971   HPI Jane English is here for follow up and re-evaluation of chronic medical conditions, medication management and review of any available recent lab and radiology data.  Preventive health is updated, specifically  Cancer screening and Immunization.   Questions or concerns regarding consultations or procedures which the PT has had in the interim are  addressed. The PT denies any adverse reactions to current medications since the last visit. Has done very well with weight loss an blood sugar control on Mounjaro Denies polyuria, polydipsia, blurred vision , or hypoglycemic episodes.  Increased allergy symptoms affecting eyes , requests med refill    ROS Denies recent fever or chills. Denies sinus pressure, nasal congestion, ear pain or sore throat. Denies chest congestion, productive cough or wheezing. Denies chest pains, palpitations and leg swelling Denies abdominal pain, nausea, vomiting,diarrhea or constipation.   Denies dysuria, frequency, hesitancy or incontinence. C/po left knee and right foot pain. Denies headaches, seizures, numbness, or tingling. Denies depression, anxiety or insomnia. Denies skin break down or rash.   PE  BP 103/69 (BP Location: Right Arm, Patient Position: Sitting, Cuff Size: Large)   Pulse 90   Ht '5\' 6"'$  (1.676 m)   Wt 169 lb 1.9 oz (76.7 kg)   SpO2 97%   BMI 27.30 kg/m   Patient alert and oriented and in no cardiopulmonary distress.  HEENT: No facial asymmetry, EOMI,     Neck supple .  Chest: Clear to auscultation bilaterally.  CVS: S1, S2 no murmurs, no S3.Regular rate.  ABD: Soft non tender.   Ext: No edema  MS: Adequate ROM spine, shoulders, hips and reduced in left knee.  Skin: Intact, no ulcerations or rash noted.  Psych: Good eye contact, normal affect. Memory intact not anxious or depressed appearing.  CNS: CN 2-12 intact, power,  normal throughout.no focal deficits  noted.   Assessment & Plan  Intractable chronic migraine without aura and without status migrainosus Controlled, no change in medication   Type 2 diabetes mellitus with other specified complication ([place]) Jane English is reminded of the importance of commitment to daily physical activity for 30 minutes or more, as able and the need to limit carbohydrate intake to 30 to 60 grams per meal to help with blood sugar control.   The need to take medication as prescribed, test blood sugar as directed, and to call between visits if there is a concern that blood sugar is uncontrolled is also discussed.   Jane English is reminded of the importance of daily foot exam, annual eye examination, and good blood sugar, blood pressure and cholesterol control.     Latest Ref Rng & Units 06/05/2022    8:45 PM 03/21/2022   12:13 PM 05/14/2021    3:47 PM 04/06/2020    4:30 PM 01/04/2020    8:54 AM  Diabetic Labs  HbA1c 4.8 - 5.6 %  6.7  6.5  6.3  6.4   Chol 100 - 199 mg/dL  172  187     HDL >39 mg/dL  50  53     Calc LDL 0 - 99 mg/dL  94  104     Triglycerides 0 - 149 mg/dL  165  172     Creatinine 0.44 - 1.00 mg/dL 1.06  1.04  0.95  1.05        06/20/2022    9:04 AM 06/05/2022   11:00 PM  06/05/2022   10:45 PM 06/05/2022   10:30 PM 06/05/2022   10:15 PM 06/05/2022   10:00 PM 06/05/2022    7:52 PM  BP/Weight  Systolic BP 960 454 098 119 147 829   Diastolic BP 69 80 73 79 92 73   Wt. (Lbs) 169.12      176  BMI 27.3 kg/m2      28.41 kg/m2      Latest Ref Rng & Units 03/21/2022   10:40 AM 02/26/2022   12:00 AM  Foot/eye exam completion dates  Eye Exam No Retinopathy  No Retinopathy      Foot Form Completion  Done      This result is from an external source.      Updated lab needed at/ before next visit.   Urinary urgency Controlled, no change in medication   Obesity (BMI 30.0-34.9) Improved  Patient re-educated about  the importance of commitment to a  minimum of 150 minutes of exercise per week as  able.  The importance of healthy food choices with portion control discussed, as well as eating regularly and within a 12 hour window most days. The need to choose "clean , green" food 50 to 75% of the time is discussed, as well as to make water the primary drink and set a goal of 64 ounces water daily.       06/20/2022    9:04 AM 06/05/2022    7:52 PM 05/30/2022   11:18 AM  Weight /BMI  Weight 169 lb 1.9 oz 176 lb 172 lb 6.4 oz  Height '5\' 6"'$  (1.676 m) '5\' 6"'$  (1.676 m) '5\' 6"'$  (1.676 m)  BMI 27.3 kg/m2 28.41 kg/m2 27.83 kg/m2      Chronic pain syndrome Managed by pain clinic  Cough variant asthma Controlled, no change in medication   Allergic conjunctivitis Increased and uncontrolled symptoms, patanol prescribed

## 2022-06-26 DIAGNOSIS — Z9689 Presence of other specified functional implants: Secondary | ICD-10-CM | POA: Diagnosis not present

## 2022-06-26 DIAGNOSIS — Z4542 Encounter for adjustment and management of neuropacemaker (brain) (peripheral nerve) (spinal cord): Secondary | ICD-10-CM | POA: Diagnosis not present

## 2022-06-26 DIAGNOSIS — M961 Postlaminectomy syndrome, not elsewhere classified: Secondary | ICD-10-CM | POA: Diagnosis not present

## 2022-07-03 ENCOUNTER — Ambulatory Visit
Admission: RE | Admit: 2022-07-03 | Discharge: 2022-07-03 | Disposition: A | Discharge status: Home / Self Care | Payer: Medicare HMO | Source: Ambulatory Visit | Attending: Orthopaedic Surgery | Admitting: Orthopaedic Surgery

## 2022-07-03 DIAGNOSIS — G8929 Other chronic pain: Secondary | ICD-10-CM

## 2022-07-03 DIAGNOSIS — M25562 Pain in left knee: Secondary | ICD-10-CM | POA: Diagnosis not present

## 2022-07-05 ENCOUNTER — Ambulatory Visit (INDEPENDENT_AMBULATORY_CARE_PROVIDER_SITE_OTHER): Payer: Medicare HMO | Admitting: Orthopaedic Surgery

## 2022-07-05 ENCOUNTER — Encounter: Payer: Self-pay | Admitting: Orthopaedic Surgery

## 2022-07-05 DIAGNOSIS — S83282A Other tear of lateral meniscus, current injury, left knee, initial encounter: Secondary | ICD-10-CM

## 2022-07-05 NOTE — Progress Notes (Addendum)
Office Visit Note   Patient: Jane English           Date of Birth: 02/10/1971           MRN: [id number] Visit Date: 07/05/2022              Requested by: Fayrene Helper, [address], [address] [place],  [place] [address] PCP: Fayrene Helper, MD   Assessment & Plan: Visit Diagnoses:  1. Acute lateral meniscus tear of left knee, initial encounter     Plan: MRI of the left knee shows a complex tear of the lateral meniscus with slight extrusion.  No significant chondromalacia.  Based on these findings I have recommended left knee arthroscopic partial lateral meniscectomy.  Risk benefits recovery prognosis of the surgery reviewed with the patient in detail.  Questions encouraged and answered.  Jackelyn Poling will meet with the patient to schedule surgery as soon as possible.  I also discussed everything and answered her son's questions over the phone.  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  Follow-Up Instructions: No follow-ups on file.   Orders:  No orders of the defined types were placed in this encounter.  No orders of the defined types were placed in this encounter.     Procedures: No procedures performed   Clinical Data: No additional findings.   Subjective: Chief Complaint  Patient presents with   Left Knee - Pain, Follow-up    HPI Jane English returns today to discuss left knee MRI scan.  Continues to have pain on the lateral side of the knee with activity.  Review of Systems  Constitutional: Negative.   HENT: Negative.    Eyes: Negative.   Respiratory: Negative.    Cardiovascular: Negative.   Endocrine: Negative.   Musculoskeletal: Negative.   Neurological: Negative.   Hematological: Negative.   Psychiatric/Behavioral: Negative.    All other systems reviewed and are negative.    Objective: Vital Signs: There were  no vitals taken for this visit.  Physical Exam Vitals and nursing note reviewed.  Constitutional:      Appearance: She is well-developed.  Pulmonary:     Effort: Pulmonary effort is normal.  Skin:    General: Skin is warm.     Capillary Refill: Capillary refill takes less than 2 seconds.  Neurological:     Mental Status: She is alert and oriented to person, place, and time.  Psychiatric:        Behavior: Behavior normal.        Thought Content: Thought content normal.        Judgment: Judgment normal.     Ortho Exam Examination of the left knee shows lateral joint line tenderness.  Pain at the lateral joint line with McMurray's test.  Range of motion is preserved.  Small joint effusion. Specialty Comments:  No specialty comments available.  Imaging: No results found.   PMFS History: Patient Active Problem List   Diagnosis Date Noted   Acute lateral meniscus tear of left knee 07/05/2022   Type 2 diabetes mellitus with other specified complication ([place]) 82/99/3716   Anxiety 12/16/2020   Left foot pain 05/01/2020   Great toe pain, left 05/01/2020   Back spasm 04/10/2020   Urinary urgency 08/11/2019   Overweight (BMI 25.0-29.9) 04/26/2019   GAD (generalized anxiety  disorder) 04/26/2019   Arthralgia of left shoulder region 11/05/2018   Depression, major, single episode, severe ([place]) 10/03/2018   Vitamin D deficiency 12/14/2017   Bilateral ankle pain 04/16/2017   Amenorrhea 10/15/2016   Paresthesia 05/06/2016   Back pain with radiation 04/04/2016   Obesity (BMI 30.0-34.9) 03/25/2016   Neuropathic pain of finger of right hand 12/31/2015   Pap smear of cervix shows high risk HPV present 08/08/2015   Acute bronchitis 05/28/2015   Primary osteoarthritis of right knee 03/17/2015   Cyst of left ovary 02/06/2015   Anovulation 02/06/2015   Secondary amenorrhea 02/06/2015   Intractable chronic migraine without aura and without status migrainosus 12/21/2014   Migraine  09/21/2014   Heart murmur 05/13/2014   Abdominal adhesions 02/15/2014   Anemia 10/11/2013   H. pylori duodenitis 07/07/2013   PTSD (post-traumatic stress disorder) 03/26/2013   Insomnia 03/18/2013   Allergic conjunctivitis 12/10/2012   GERD (gastroesophageal reflux disease) 10/29/2012   Chronic pain syndrome 10/06/2012   Postlaminectomy syndrome 10/06/2012   Cervical neck pain with evidence of disc disease 04/13/2012   Allergic rhinitis 04/17/2010   Cough variant asthma 03/04/2010   Past Medical History:  Diagnosis Date   Anemia    Asthma    Asthma flare 04/09/2013   Back pain    Bronchitis    Chronic abdominal pain    Chronic constipation    Constipation due to opioid therapy    Depression    Depression, major, single episode, severe ([place]) 10/03/2018   PHQ 9 score of 15   Diabetes mellitus without complication ([place])    Diabetes mellitus, type II ([place])    DVT (deep venous thrombosis) ([place]) 2010   GERD (gastroesophageal reflux disease)    Heart murmur    no cardiologist   Helicobacter pylori gastritis 06/11/2013   Colonoscopy Dr. Hilarie Fredrickson   Hypertension    IBS (irritable bowel syndrome)    Migraine headache    Neuropathy    Obesity    Obsessive-compulsive disorder    PSYCHOTIC D/O W/HALLUCINATIONS CONDS CLASS ELSW 03/04/2010   Qualifier: Diagnosis of  By: Moshe Cipro MD, Margaret     PTSD (post-traumatic stress disorder)    SBO (small bowel obstruction) ([place]) 08/09/2013   Seasonal allergies 12/10/2012   Seizures ([place])    Shortness of breath     Family History  Problem Relation Age of Onset   Physical abuse Mother    Alcohol abuse Mother    Cirrhosis Mother    Lung cancer Father    Stomach cancer Father    Esophageal cancer Father    Alcohol abuse Father    Mental illness Father    Diabetes Sister    Hypertension Sister    Bipolar disorder Sister    Schizophrenia Sister    Diabetes Sister    Drug abuse Sister    HIV Sister    Pneumonia Sister        died as a baby    Alcohol abuse Brother    Hypertension Brother    Kidney disease Brother    Diabetes Brother    Drug abuse Brother    Mental illness Brother    Alcohol abuse Brother    Alcohol abuse Brother    Hypertension Brother    Diabetes Brother    Alcohol abuse Brother    Mental illness Brother        in [place]   Alcohol abuse Brother    Alcohol abuse Brother    Bipolar disorder  Brother    Hypertension Brother    Bipolar disorder Brother    Drug abuse Brother    Alcohol abuse Brother    Bipolar disorder Brother    Bipolar disorder Daughter    Bipolar disorder Son    Bipolar disorder Son    ADD / ADHD Neg Hx    Anxiety disorder Neg Hx    Dementia Neg Hx    Depression Neg Hx    OCD Neg Hx    Seizures Neg Hx    Paranoid behavior Neg Hx    Colon cancer Neg Hx    Rectal cancer Neg Hx     Past Surgical History:  Procedure Laterality Date   ANTERIOR CERVICAL DECOMP/DISCECTOMY FUSION  07/07/2012   Procedure: ANTERIOR CERVICAL DECOMPRESSION/DISCECTOMY FUSION 2 LEVELS;  Surgeon: Floyce Stakes, MD;  Location: [facility];  Service: Neurosurgery;  Laterality: N/A;  Cervical four-five, five - six  Anterior cervical decompression/diskectomy/fusion/plate   APPENDECTOMY  1986   BOWEL RESECTION N/A 07/29/2013   Procedure: serosal repair;  Surgeon: Adin Hector, MD;  Location: [facility];  Service: General;  Laterality: N/A;   CARPAL TUNNEL RELEASE Bilateral    COLON SURGERY N/A    Phreesia 07/29/2020   LAPAROSCOPY N/A 07/29/2013   Procedure: diagnostic laporoscopy;  Surgeon: Adin Hector, MD;  Location: [facility];  Service: General;  Laterality: N/A;   LAPAROSCOPY N/A 08/16/2013   Procedure: LAPAROSCOPY DIAGNOSTIC/LYSIS OF ADHESIONS;  Surgeon: Adin Hector, MD;  [facility]: [facility];  Service: General;  Laterality: N/A;   LAPAROTOMY N/A 08/16/2013   Procedure: EXPLORATORY LAPAROTOMY/SMALL BOWEL RESECTION (JEJUNUM);  Surgeon: Adin Hector, MD;  Location: [facility];  Service: General;  Laterality:  N/A;   LUMBAR SPINE SURGERY  2010   x 3   LYSIS OF ADHESION  2003   Dr. Irving Shows   LYSIS OF ADHESION N/A 07/29/2013   Procedure: LYSIS OF ADHESION;  Surgeon: Adin Hector, MD;  Location: [facility];  Service: General;  Laterality: N/A;   [place]?   Linna Hoff, [place]   SPINAL CORD STIMULATOR IMPLANT     SPINE SURGERY N/A    Phreesia 07/29/2020   TRIGGER FINGER RELEASE  2009   right pinkie finger   TUBAL LIGATION  1994   Social History   Occupational History   Occupation: disabled  Tobacco Use   Smoking status: Never   Smokeless tobacco: Never  Vaping Use   Vaping Use: Never used  Substance and Sexual Activity   Alcohol use: No   Drug use: No   Sexual activity: Not Currently    Partners: Male    Birth control/protection: Surgical    Comment: tubal

## 2022-07-09 ENCOUNTER — Encounter: Payer: Medicare HMO | Attending: Physical Medicine and Rehabilitation | Admitting: Physical Medicine and Rehabilitation

## 2022-07-09 ENCOUNTER — Encounter: Payer: Self-pay | Admitting: Physical Medicine and Rehabilitation

## 2022-07-09 VITALS — BP 107/71 | HR 81 | Ht 66.0 in | Wt 164.8 lb

## 2022-07-09 DIAGNOSIS — M7918 Myalgia, other site: Secondary | ICD-10-CM | POA: Diagnosis not present

## 2022-07-09 DIAGNOSIS — Z79891 Long term (current) use of opiate analgesic: Secondary | ICD-10-CM | POA: Diagnosis not present

## 2022-07-09 DIAGNOSIS — G894 Chronic pain syndrome: Secondary | ICD-10-CM | POA: Insufficient documentation

## 2022-07-09 DIAGNOSIS — Z5181 Encounter for therapeutic drug level monitoring: Secondary | ICD-10-CM | POA: Insufficient documentation

## 2022-07-09 MED ORDER — LIDOCAINE HCL (PF) 1 % IJ SOLN: 2.0000 mL | Freq: Once | INTRAMUSCULAR | Status: DC

## 2022-07-09 MED ORDER — SODIUM CHLORIDE (PF) 0.9 % IJ SOLN: 3.0000 mL | INTRAMUSCULAR | Status: DC | PRN

## 2022-07-09 MED ORDER — OXYCODONE HCL 5 MG PO TABS: 10.0000 mg | ORAL_TABLET | Freq: Two times a day (BID) | ORAL | 0 refills | Status: DC

## 2022-07-09 NOTE — Progress Notes (Signed)
Trigger Point Injection  Indication: Cervical myofascial pain not relieved by medication management and other conservative care.  Informed consent was obtained after describing risk and benefits of the procedure with the patient, this includes bleeding, bruising, infection and medication side effects.  The patient wishes to proceed and has given written consent.  The patient was placed in a seated position.  The area of pain was marked and prepped with Betadine.  It was entered with a 25-gauge 1/2 inch needle and a total of 5 mL of 1% lidocaine and normal saline was injected into a total of 4 trigger points, after negative draw back for blood.  The patient tolerated the procedure well.  Post procedure instructions were given.  

## 2022-07-12 ENCOUNTER — Telehealth: Payer: Self-pay | Admitting: *Deleted

## 2022-07-12 LAB — TOXASSURE SELECT,+ANTIDEPR,UR

## 2022-07-12 NOTE — Telephone Encounter (Signed)
Urine drug screen for this encounter is consistent for prescribed medication, however it is also positive for alcohol.  Formal warning letter sent through [facility].

## 2022-07-22 ENCOUNTER — Other Ambulatory Visit: Payer: Self-pay | Admitting: Physician Assistant

## 2022-07-22 MED ORDER — ONDANSETRON HCL 4 MG PO TABS: 4.0000 mg | ORAL_TABLET | Freq: Three times a day (TID) | ORAL | 0 refills | Status: DC | PRN

## 2022-07-22 MED ORDER — HYDROCODONE-ACETAMINOPHEN 5-325 MG PO TABS: 1.0000 | ORAL_TABLET | Freq: Three times a day (TID) | ORAL | 0 refills | Status: DC | PRN

## 2022-07-23 DIAGNOSIS — J45909 Unspecified asthma, uncomplicated: Secondary | ICD-10-CM | POA: Diagnosis not present

## 2022-07-25 ENCOUNTER — Encounter: Payer: Self-pay | Admitting: Orthopaedic Surgery

## 2022-07-25 DIAGNOSIS — M948X6 Other specified disorders of cartilage, lower leg: Secondary | ICD-10-CM | POA: Diagnosis not present

## 2022-07-25 DIAGNOSIS — S83282A Other tear of lateral meniscus, current injury, left knee, initial encounter: Secondary | ICD-10-CM | POA: Diagnosis not present

## 2022-07-25 DIAGNOSIS — M94262 Chondromalacia, left knee: Secondary | ICD-10-CM | POA: Diagnosis not present

## 2022-08-01 ENCOUNTER — Ambulatory Visit (INDEPENDENT_AMBULATORY_CARE_PROVIDER_SITE_OTHER): Payer: Medicare HMO | Admitting: Orthopaedic Surgery

## 2022-08-01 ENCOUNTER — Encounter: Payer: Self-pay | Admitting: Orthopaedic Surgery

## 2022-08-01 DIAGNOSIS — S83282A Other tear of lateral meniscus, current injury, left knee, initial encounter: Secondary | ICD-10-CM

## 2022-08-01 DIAGNOSIS — M1712 Unilateral primary osteoarthritis, left knee: Secondary | ICD-10-CM

## 2022-08-01 NOTE — Progress Notes (Signed)
Post-Op Visit Note   Patient: Jane English           Date of Birth: Mar 09, 1971           MRN: [id number] Visit Date: 08/01/2022 PCP: Fayrene Helper, MD   Assessment & Plan:  Chief Complaint:  Chief Complaint  Patient presents with   Left Knee - Follow-up    Left knee arthroscopy 07/25/2022   Visit Diagnoses:  1. Acute lateral meniscus tear of left knee, initial encounter   2. Primary osteoarthritis of left knee     Plan: Patient is 1 week status post left knee arthroscopy partial lateral meniscectomy and chondroplasty.  She is overall doing okay.  Walking with a single-point cane.  Examination of the left knee shows healed surgical incisions.  Expected postoperative swelling.  No signs of infection or drainage.  Adequate range of motion.  Arthroscopy pictures were reviewed in detail.  All questions answered.  Home exercises were provided which she will begin.  We will keep her out of work until follow-up appointment in about 4 weeks.  Questions encouraged and answered.  Follow-Up Instructions: Return in about 4 weeks (around 08/29/2022).   Orders:  No orders of the defined types were placed in this encounter.  No orders of the defined types were placed in this encounter.   Imaging: No results found.  PMFS History: Patient Active Problem List   Diagnosis Date Noted   Primary osteoarthritis of left knee 08/01/2022   Acute lateral meniscus tear of left knee 07/05/2022   Type 2 diabetes mellitus with other specified complication ([place]) 41/93/7902   Anxiety 12/16/2020   Left foot pain 05/01/2020   Great toe pain, left 05/01/2020   Back spasm 04/10/2020   Urinary urgency 08/11/2019   Overweight (BMI 25.0-29.9) 04/26/2019   GAD (generalized anxiety disorder) 04/26/2019   Arthralgia of left shoulder region 11/05/2018   Depression, major, single episode, severe ([place]) 10/03/2018   Vitamin D deficiency 12/14/2017   Bilateral ankle pain 04/16/2017   Amenorrhea  10/15/2016   Paresthesia 05/06/2016   Back pain with radiation 04/04/2016   Obesity (BMI 30.0-34.9) 03/25/2016   Neuropathic pain of finger of right hand 12/31/2015   Pap smear of cervix shows high risk HPV present 08/08/2015   Acute bronchitis 05/28/2015   Primary osteoarthritis of right knee 03/17/2015   Cyst of left ovary 02/06/2015   Anovulation 02/06/2015   Secondary amenorrhea 02/06/2015   Intractable chronic migraine without aura and without status migrainosus 12/21/2014   Migraine 09/21/2014   Heart murmur 05/13/2014   Abdominal adhesions 02/15/2014   Anemia 10/11/2013   H. pylori duodenitis 07/07/2013   PTSD (post-traumatic stress disorder) 03/26/2013   Insomnia 03/18/2013   Allergic conjunctivitis 12/10/2012   GERD (gastroesophageal reflux disease) 10/29/2012   Chronic pain syndrome 10/06/2012   Postlaminectomy syndrome 10/06/2012   Cervical neck pain with evidence of disc disease 04/13/2012   Allergic rhinitis 04/17/2010   Cough variant asthma 03/04/2010   Past Medical History:  Diagnosis Date   Anemia    Asthma    Asthma flare 04/09/2013   Back pain    Bronchitis    Chronic abdominal pain    Chronic constipation    Constipation due to opioid therapy    Depression    Depression, major, single episode, severe ([place]) 10/03/2018   PHQ 9 score of 15   Diabetes mellitus without complication ([place])    Diabetes mellitus, type II ([place])    DVT (deep  venous thrombosis) ([place]) 2010   GERD (gastroesophageal reflux disease)    Heart murmur    no cardiologist   Helicobacter pylori gastritis 06/11/2013   Colonoscopy Dr. Hilarie Fredrickson   Hypertension    IBS (irritable bowel syndrome)    Migraine headache    Neuropathy    Obesity    Obsessive-compulsive disorder    PSYCHOTIC D/O W/HALLUCINATIONS CONDS CLASS ELSW 03/04/2010   Qualifier: Diagnosis of  By: Moshe Cipro MD, Joycelyn Schmid     PTSD (post-traumatic stress disorder)    SBO (small bowel obstruction) ([place]) 08/09/2013   Seasonal  allergies 12/10/2012   Seizures ([place])    Shortness of breath     Family History  Problem Relation Age of Onset   Physical abuse Mother    Alcohol abuse Mother    Cirrhosis Mother    Lung cancer Father    Stomach cancer Father    Esophageal cancer Father    Alcohol abuse Father    Mental illness Father    Diabetes Sister    Hypertension Sister    Bipolar disorder Sister    Schizophrenia Sister    Diabetes Sister    Drug abuse Sister    HIV Sister    Pneumonia Sister        died as a baby   Alcohol abuse Brother    Hypertension Brother    Kidney disease Brother    Diabetes Brother    Drug abuse Brother    Mental illness Brother    Alcohol abuse Brother    Alcohol abuse Brother    Hypertension Brother    Diabetes Brother    Alcohol abuse Brother    Mental illness Brother        in [place]   Alcohol abuse Brother    Alcohol abuse Brother    Bipolar disorder Brother    Hypertension Brother    Bipolar disorder Brother    Drug abuse Brother    Alcohol abuse Brother    Bipolar disorder Brother    Bipolar disorder Daughter    Bipolar disorder Son    Bipolar disorder Son    ADD / ADHD Neg Hx    Anxiety disorder Neg Hx    Dementia Neg Hx    Depression Neg Hx    OCD Neg Hx    Seizures Neg Hx    Paranoid behavior Neg Hx    Colon cancer Neg Hx    Rectal cancer Neg Hx     Past Surgical History:  Procedure Laterality Date   ANTERIOR CERVICAL DECOMP/DISCECTOMY FUSION  07/07/2012   Procedure: ANTERIOR CERVICAL DECOMPRESSION/DISCECTOMY FUSION 2 LEVELS;  Surgeon: Floyce Stakes, MD;  Location: [facility];  Service: Neurosurgery;  Laterality: N/A;  Cervical four-five, five - six  Anterior cervical decompression/diskectomy/fusion/plate   APPENDECTOMY  1986   BOWEL RESECTION N/A 07/29/2013   Procedure: serosal repair;  Surgeon: Adin Hector, MD;  [facility];  Service: General;  Laterality: N/A;   CARPAL TUNNEL RELEASE Bilateral    COLON SURGERY N/A    Phreesia  07/29/2020   LAPAROSCOPY N/A 07/29/2013   Procedure: diagnostic laporoscopy;  Surgeon: Adin Hector, MD;  [facility];  Service: General;  Laterality: N/A;   LAPAROSCOPY N/A 08/16/2013   Procedure: LAPAROSCOPY DIAGNOSTIC/LYSIS OF ADHESIONS;  Surgeon: Adin Hector, MD;  [facility];  Service: General;  Laterality: N/A;   LAPAROTOMY N/A 08/16/2013   Procedure: EXPLORATORY LAPAROTOMY/SMALL BOWEL RESECTION (JEJUNUM);  Surgeon: Adin Hector,  MD;  [facility];  Service: General;  Laterality: N/A;   LUMBAR SPINE SURGERY  2010   x 3   LYSIS OF ADHESION  2003   Dr. Irving Shows   LYSIS OF ADHESION N/A 07/29/2013   Procedure: LYSIS OF ADHESION;  Surgeon: Adin Hector, MD;  [facility];  Service: General;  Laterality: N/A;   [place]?   Linna Hoff, [place]   SPINAL CORD STIMULATOR IMPLANT     SPINE SURGERY N/A    [employer]reesia 07/29/2020   TRIGGER FINGER RELEASE  2009   right pinkie finger   TUBAL LIGATION  1994   Social History   Occupational History   Occupation: disabled  Tobacco Use   Smoking status: Never   Smokeless tobacco: Never  Vaping Use   Vaping Use: Never used  Substance and Sexual Activity   Alcohol use: No   Drug use: No   Sexual activity: Not Currently    Partners: Male    Birth control/protection: Surgical    Comment: tubal

## 2022-08-05 ENCOUNTER — Other Ambulatory Visit: Payer: Self-pay

## 2022-08-05 MED ORDER — AMITRIPTYLINE HCL 10 MG PO TABS: ORAL_TABLET | ORAL | 0 refills | Status: DC

## 2022-08-06 ENCOUNTER — Encounter: Payer: Medicare HMO | Attending: Physical Medicine and Rehabilitation | Admitting: Physical Medicine and Rehabilitation

## 2022-08-06 ENCOUNTER — Ambulatory Visit: Payer: Medicare HMO | Admitting: Physical Medicine and Rehabilitation

## 2022-08-06 VITALS — BP 108/74 | HR 97 | Ht 66.0 in | Wt 164.6 lb

## 2022-08-06 DIAGNOSIS — M961 Postlaminectomy syndrome, not elsewhere classified: Secondary | ICD-10-CM | POA: Insufficient documentation

## 2022-08-06 DIAGNOSIS — R200 Anesthesia of skin: Secondary | ICD-10-CM | POA: Insufficient documentation

## 2022-08-06 DIAGNOSIS — K5900 Constipation, unspecified: Secondary | ICD-10-CM | POA: Diagnosis not present

## 2022-08-06 DIAGNOSIS — S838X2D Sprain of other specified parts of left knee, subsequent encounter: Secondary | ICD-10-CM

## 2022-08-06 DIAGNOSIS — M5416 Radiculopathy, lumbar region: Secondary | ICD-10-CM | POA: Insufficient documentation

## 2022-08-06 DIAGNOSIS — S838X2S Sprain of other specified parts of left knee, sequela: Secondary | ICD-10-CM | POA: Insufficient documentation

## 2022-08-06 MED ORDER — OXYCODONE HCL 5 MG PO TABS
10.0000 mg | ORAL_TABLET | Freq: Two times a day (BID) | ORAL | 0 refills | Status: DC
Start: 2022-08-06 — End: 2022-09-06

## 2022-08-06 NOTE — Progress Notes (Addendum)
Subjective:    Patient ID: Jane English, female    DOB: 01/10/1971, 51 y.o.   MRN: [id number]  HPI:    Jane English is a 51 y.o. female who returns for f/u appointment for chronic pain in her back secondary to failed back syndrome, bilateral knees secondary to chonrdomalacia of bilateral knees, pain in bilateral hands secondary to osteoarthritis, left sided lower back pain  1) Failed back syndrome -she would like to get her spinal cord stimulator removed, discussed that I have discussed with the [employer] rep -once she gets moving pain is better, but she feels grinding with movement -got her MRI done yesterday -she has called Dr. Keene Breath office and the records have not been called, I called today to see if this can be expedited.  -she continues to have severe pain -she has been using the 7.'5mg'$  meloxicam up to three times per day -she says she needs [place] to reach out to her former pain clinic to help get her medical records -Her current exercise regimen is walking and performing stretching exercises. -she is reporting decreased sensation in left lower extremity compared to right -pain is still severe despite starting meloxicam and robaxin -her hardware is MRI compatible.  -she asks when she get her spinal cord stimulator removed. She does not want to go back to her old Psychologist, sport and exercise -Last UDS was Performed on 01/26/2021, it was consistent.  -She has been taking oxycodone PRN (30MME) for years- it is helpful for her pain -she has had negative responses with Gabapentin and Lyrica.  -pain has been really severe recently.  -she has never tried Cymbalta.  -she has been experiencing more left sided low back pain since she has started working at [employer] -no more falls.  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 -she asks if a lady reached out to me about the stimulator machine, she would like to get her  spinal cord stimulator removed. She does not want to get this removed where it was originally placed, but would rather get it removed at [place] [facility]  2) Chondromalacia of bilateral knees/ left lateral meniscal injury -had knee surgery on the 21st, her son said it lasted 10 minutes -she was sent hydrocodone but has not taken it -has been taking her long standing oxycodone.  -stitches removed -she ordered herself an exercise stepper  -she was told not to overdo her exercise   3) Anxiety -she receives Alprazolam from Dr. Moshe Cipro, PCP- dose was recently reduced in half.  [name] has discussed the black box warning of using opioids and benzodiazepines and highlighted the dangers of using these drugs together and discussed the adverse events including respiratory suppression, overdose, cognitive impairment and importance of compliance with current regimen.  -she has been referred to therapy by her PCP.   4) Osteoarthritis of bilateral hands  5) Right buttock pain  6) Myofascial pain  7) Chronic constipation: -has followed with GI since last visit  8) Prediabetes -she was advised by GI to discuss Wegovy vs. Ozempic with her PCP for weight loss- will also help with diabetes.  -there is plan for repeat colostomy- she had a bad experience with this last time.  -taking Monjouro -she lost weight from 190s to 180s with this -she does not eat as much with this -she gets sick when she eats things too sweet  9) Nausea: she does  feel some some nausea with her pain medications.   10) Low mood -at times -she is handling at well.  -She loves to meet new people through her work.  -she has started a new piercing job.   11) Left lumbar radiculopathy: - it is currently really bothering her  -the meloxicam and muscle relaxers help temporarily but not for long -it is severe all the time but she is still working through it -she is unable to have an MRI due to the spinal cord stimulator- she would  like to get this removed as soon as possible - she continues on oxycodone -she purchased an over the counter heating oil but has not tried that -she cannot tolerate gabapentin since it has caused her to sleep walk before -she has had a hard time going to work due to the pain -she has tried amitriptyline in the past but was started on '150mg'$  and this made her feel drunk. She has not tried '10mg'$ .  -pain has been very severe -feels shooting pain throughout left leg -notes weakness -working through the pain.  -she is more interested in surgery than injections as she has not had benefit from injections before.   12) Left foot swelling and numbness: -she feels that when she applies heat it improves the numbness   Pain Inventory Average Pain 10 Pain Right Now 8 My pain is constant, sharp, burning, dull, stabbing, tingling, and aching  In the last 24 hours, has pain interfered with the following? General activity 10 Relation with others 10 Enjoyment of life 10 What TIME of day is your pain at its worst? Morning, evening and night Sleep (in general) Fair  Pain is worse with: walking, bending, sitting, inactivity, standing, and some activites Pain improves with: heat/ice and medication Relief from Meds: 9  Family History  Problem Relation Age of Onset   Physical abuse Mother    Alcohol abuse Mother    Cirrhosis Mother    Lung cancer Father    Stomach cancer Father    Esophageal cancer Father    Alcohol abuse Father    Mental illness Father    Diabetes Sister    Hypertension Sister    Bipolar disorder Sister    Schizophrenia Sister    Diabetes Sister    Drug abuse Sister    HIV Sister    Pneumonia Sister        died as a baby   Alcohol abuse Brother    Hypertension Brother    Kidney disease Brother    Diabetes Brother    Drug abuse Brother    Mental illness Brother    Alcohol abuse Brother    Alcohol abuse Brother    Hypertension Brother    Diabetes Brother    Alcohol  abuse Brother    Mental illness Brother        in [place]   Alcohol abuse Brother    Alcohol abuse Brother    Bipolar disorder Brother    Hypertension Brother    Bipolar disorder Brother    Drug abuse Brother    Alcohol abuse Brother    Bipolar disorder Brother    Bipolar disorder Daughter    Bipolar disorder Son    Bipolar disorder Son    ADD / ADHD Neg Hx    Anxiety disorder Neg Hx    Dementia Neg Hx    Depression Neg Hx    OCD Neg Hx    Seizures Neg Hx  Paranoid behavior Neg Hx    Colon cancer Neg Hx    Rectal cancer Neg Hx    Social History   Socioeconomic History   Marital status: Married    Spouse name: Herbie Baltimore   Number of children: 4   Years of education: 12   Highest education level: Some college, no degree  Occupational History   Occupation: disabled  Tobacco Use   Smoking status: Never   Smokeless tobacco: Never  Vaping Use   Vaping Use: Never used  Substance and Sexual Activity   Alcohol use: No   Drug use: No   Sexual activity: Not Currently    Partners: Male    Birth control/protection: Surgical    Comment: tubal  Other Topics Concern   Not on file  Social History Narrative   Not on file   Social Determinants of Health   Financial Resource Strain: Low Risk  (11/08/2021)   Overall Financial Resource Strain (CARDIA)    Difficulty of Paying Living Expenses: Not hard at all  Food Insecurity: No Food Insecurity (11/08/2021)   Hunger Vital Sign    Worried About Running Out of Food in the Last Year: Never true    Ran Out of Food in the Last Year: Never true  Transportation Needs: No Transportation Needs (11/08/2021)   PRAPARE - Hydrologist (Medical): No    Lack of Transportation (Non-Medical): No  Physical Activity: Insufficiently Active (11/08/2021)   Exercise Vital Sign    Days of Exercise per Week: 3 days    Minutes of Exercise per Session: 30 min  Stress: No Stress Concern Present (11/08/2021)   [place]    Feeling of Stress : Only a little  Social Connections: Moderately Isolated (11/08/2021)   Social Connection and Isolation Panel [NHANES]    Frequency of Communication with Friends and Family: More than three times a week    Frequency of Social Gatherings with Friends and Family: Once a week    Attends Religious Services: Never    Marine scientist or Organizations: No    Attends Music therapist: Never    Marital Status: Married   Past Surgical History:  Procedure Laterality Date   ANTERIOR CERVICAL DECOMP/DISCECTOMY FUSION  07/07/2012   Procedure: ANTERIOR CERVICAL DECOMPRESSION/DISCECTOMY FUSION 2 LEVELS;  Surgeon: Floyce Stakes, MD;  Location: [facility];  Service: Neurosurgery;  Laterality: N/A;  Cervical four-five, five - six  Anterior cervical decompression/diskectomy/fusion/plate   APPENDECTOMY  1986   BOWEL RESECTION N/A 07/29/2013   Procedure: serosal repair;  Surgeon: Adin Hector, MD;  Location: [facility];  Service: General;  Laterality: N/A;   CARPAL TUNNEL RELEASE Bilateral    COLON SURGERY N/A    Phreesia 07/29/2020   LAPAROSCOPY N/A 07/29/2013   Procedure: diagnostic laporoscopy;  Surgeon: Adin Hector, MD;  Location: [facility];  Service: General;  Laterality: N/A;   LAPAROSCOPY N/A 08/16/2013   Procedure: LAPAROSCOPY DIAGNOSTIC/LYSIS OF ADHESIONS;  Surgeon: Adin Hector, MD;  Location: [facility];  Service: General;  Laterality: N/A;   LAPAROTOMY N/A 08/16/2013   Procedure: EXPLORATORY LAPAROTOMY/SMALL BOWEL RESECTION (JEJUNUM);  Surgeon: Adin Hector, MD;  Location: [facility];  Service: General;  Laterality: N/A;   LUMBAR SPINE SURGERY  2010   x 3   LYSIS OF ADHESION  2003   Dr. Irving Shows   LYSIS OF ADHESION N/A 07/29/2013   Procedure: LYSIS  OF ADHESION;  Surgeon: Adin Hector, MD;  Location: [facility];  Service: General;  Laterality: N/A;   [place]?    Linna [place], [place]   SPINAL CORD STIMULATOR IMPLANT     SPINE SURGERY N/A    Phreesia 07/29/2020   TRIGGER FINGER RELEASE  2009   right pinkie finger   TUBAL LIGATION  1994   Past Surgical History:  Procedure Laterality Date   ANTERIOR CERVICAL DECOMP/DISCECTOMY FUSION  07/07/2012   Procedure: ANTERIOR CERVICAL DECOMPRESSION/DISCECTOMY FUSION 2 LEVELS;  Surgeon: Floyce Stakes, MD;  Location: [facility];  Service: Neurosurgery;  Laterality: N/A;  Cervical four-five, five - six  Anterior cervical decompression/diskectomy/fusion/plate   APPENDECTOMY  1986   BOWEL RESECTION N/A 07/29/2013   Procedure: serosal repair;  Surgeon: Adin Hector, MD;  Location: [facility];  Service: General;  Laterality: N/A;   CARPAL TUNNEL RELEASE Bilateral    COLON SURGERY N/A    Phreesia 07/29/2020   LAPAROSCOPY N/A 07/29/2013   Procedure: diagnostic laporoscopy;  Surgeon: Adin Hector, MD;  [facility];  Service: General;  Laterality: N/A;   LAPAROSCOPY N/A 08/16/2013   Procedure: LAPAROSCOPY DIAGNOSTIC/LYSIS OF ADHESIONS;  Surgeon: Adin Hector, MD;  [facility];  Service: General;  Laterality: N/A;   LAPAROTOMY N/A 08/16/2013   Procedure: EXPLORATORY LAPAROTOMY/SMALL BOWEL RESECTION (JEJUNUM);  Surgeon: Adin Hector, MD;  [facility];  Service: General;  Laterality: N/A;   LUMBAR SPINE SURGERY  2010   x 3   LYSIS OF ADHESION  2003   Dr. Irving Shows   LYSIS OF ADHESION N/A 07/29/2013   Procedure: LYSIS OF ADHESION;  Surgeon: Adin Hector, MD;  [facility];  Service: General;  Laterality: N/A;   [place]?   [place], [place]   SPINAL CORD STIMULATOR IMPLANT     SPINE SURGERY N/A    Phreesia 07/29/2020   TRIGGER FINGER RELEASE  2009   right pinkie finger   TUBAL LIGATION  1994   Past Medical History:  Diagnosis Date   Anemia    Asthma    Asthma flare 04/09/2013   Back pain    Bronchitis    Chronic abdominal pain    Chronic  constipation    Constipation due to opioid therapy    Depression    Depression, major, single episode, severe ([place]) 10/03/2018   PHQ 9 score of 15   Diabetes mellitus without complication ([place])    Diabetes mellitus, type II ([place])    DVT (deep venous thrombosis) ([place]) 2010   GERD (gastroesophageal reflux disease)    Heart murmur    no cardiologist   Helicobacter pylori gastritis 06/11/2013   Colonoscopy Dr. Hilarie Fredrickson   Hypertension    IBS (irritable bowel syndrome)    Migraine headache    Neuropathy    Obesity    Obsessive-compulsive disorder    PSYCHOTIC D/O W/HALLUCINATIONS CONDS CLASS ELSW 03/04/2010   Qualifier: Diagnosis of  By: Moshe Cipro MD, Margaret     PTSD (post-traumatic stress disorder)    SBO (small bowel obstruction) ([place]) 08/09/2013   Seasonal allergies 12/10/2012   Seizures ([place])    Shortness of breath    There were no vitals taken for this visit.  Opioid Risk Score:   Fall Risk Score:  `1  Depression screen [facility] 2/9     06/20/2022    9:05 AM 05/30/2022   11:19  AM 03/21/2022   11:02 AM 03/12/2022   11:25 AM 02/05/2022    8:47 AM 12/06/2021    9:18 AM 11/30/2021    9:27 AM  Depression screen PHQ 2/9  Decreased Interest 0 0 0 0 0 0 0  Down, Depressed, Hopeless 0 0 0 0 0 0 0  PHQ - 2 Score 0 0 0 0 0 0 0    Review of Systems  Constitutional: Negative.   HENT: Negative.    Eyes: Negative.   Respiratory: Negative.    Cardiovascular: Negative.   Gastrointestinal: Negative.   Endocrine: Negative.   Genitourinary: Negative.   Musculoskeletal:  Positive for back pain.       Pain in both knee & both shoulders  Skin: Negative.   Allergic/Immunologic: Negative.   Neurological: Negative.   Hematological: Negative.   All other systems reviewed and are negative.      Objective:  Gen: no distress, normal appearing HEENT: oral mucosa pink and moist, NCAT Cardio: Reg rate Chest: normal effort, normal rate of breathing Abd: soft, non-distended Ext: no edema Psych:  pleasant, normal affect Skin: intact Neuro: Alert and oriented  MSK: ambulating with cane    Assessment & Plan:  1. Chronic Pain of Bilateral Shoulders L>R: Continue HEP as Tolerated. Continue to Monitor.  2. Failed Back Syndrome of Lumbar Syndrome : Continue HEP as Tolerated. Continue current medication regimen. Continue to Monitor. Asked for phone number of [employer] rep and I will call her today to discuss removal of her spinal cord stimulator which is no longer functional -continue applying deep blue -called Manuela Schwartz [employer] rep to let her know patient would like spinal cord stimulator removed and to ask how I can help with this. She recommended referral to Dr. Davy Pique at [facility]. Discussed that I have already referred patient there and she has not heard from them. Placed new referral today and Manuela Schwartz will reach out to Dr. Davy Pique as well.  -Appreciate Manuela Schwartz communicating with [facility] that her current spinal cord stimulator is MRI compatible. -continue percocet BID PRN Prescribed robaxin and meloxicam. Discussed risks and benefits.  -Discussed current symptoms of pain and history of pain.  -Discussed benefits of exercise in reducing pain. -Discussed following foods that may reduce pain: 1) Ginger (especially studied for arthritis)- reduce leukotriene production to decrease inflammation 2) Blueberries- high in phytonutrients that decrease inflammation 3) Salmon- marine omega-3s reduce joint swelling and pain 4) Pumpkin seeds- reduce inflammation 5) dark chocolate- reduces inflammation 6) turmeric- reduces inflammation 7) tart cherries - reduce pain and stiffness 8) extra virgin olive oil - its compound olecanthal helps to block prostaglandins  9) chili peppers- can be eaten or applied topically via capsaicin 10) mint- helpful for headache, muscle aches, joint pain, and itching 11) garlic- reduces inflammation  Link to further information on diet for chronic  pain: [email or web address]  -Provided with a pain relief journal and discussed that it contains foods and lifestyle tips to naturally help to improve pain. Discussed that these lifestyle strategies are also very good for health unlike some medications which can have negative side effects. Discussed that the act of keeping a journal can be therapeutic and helpful to realize patterns what helps to trigger and alleviate pain.  l 3. Chronic Lower Back Pain without Sciatica: Continue HEP as Tolerated. Continue to Monitor. Referred to [facility] -texted her exercises she can do to improve spinal alignment and improve pain -discussed gabapentin but she cannot use this due to  history of sleepwalking on this medicine -recommended trying doTerra Deep Blue essential oil -discussed that it is ok for use to provide an early refill of oxycodone this times if she needs due to her increased pain 4. Left Knee Pain: No Complaints today. Continue HEP as Tolerated. Continue to Monitor. Discussed Wobenzymes.  5. Muscle Spasm of Shoulders/ Upper Back : Continue Flexeril. Continue to Monitor. 6. Chronic Pain Syndrome: Refilled: Oxycodone '5mg'$ /325 mg two tablets twice a day #120. We will continue the opioid monitoring program, this consists of regular clinic visits, examinations, urine drug screen, pill counts as well as use of [place] Controlled Substance Reporting system.   7. Constipation:  -continue Linzess as prescribed by GI -Provided list of following foods that help with constipation and highlighted a few: 1) prunes- contain high amounts of fiber.  2) apples- has a form of dietary fiber called pectin that accelerates stool movement and increases beneficial gut bacteria 3) pears- in addition to fiber, also high in fructose and sorbitol which have laxative effect 4) figs- contain an enzyme ficin which helps to speed colonic transit 5)  kiwis- contain an enzyme actinidin that improves gut motility and reduces constipation 6) oranges- rich in pectin (like apples) 7) grapefruits- contain a flavanol naringenin which has a laxative effect 8) vegetables- rich in fiber and also great sources of folate, vitamin C, and K 9) artichoke- high in inulin, prebiotic great for the microbiome 10) chicory- increases stool frequency and softness (can be added to coffee) 11) rhubarb- laxative effect 12) sweet potato- high fiber 13) beans, peas, and lentils- contain both soluble and insoluble fiber 14) chia seeds- improves intestinal health and gut flora 15) flaxseeds- laxative effect 16) whole grain rye bread- high in fiber 17) oat bran- high in soluble and insoluble fiber 18) kefir- softens stools -recommended to try at least one of these foods every day.  -drink 6-8 glasses of water per day -walk regularly, especially after meals.   8) Anxiety: -Continue alprazolam as prescribed by Dr. Moshe Cipro. Appreciate Eunice's counseling regarding increased risk of side effects with use of Alprazolam and Oxycodone.  -Discussed exercise and meditation as tools to decrease anxiety. -Recommended Down Dog Yoga app -Discussed spending time outdoors. -Discussed positive re-framing of anxiety.  -Discussed the following foods that have been show to reduce anxiety: 1) [place] nuts, mushrooms, soy beans due to their high selenium content. Upper limit of toxicity of selenium is 488mg/day so no more than 3-4 bBolivianuts per day.  2) Fatty fish such as salmon, mackerel, sardines, trout, and herring- high in omega-3 fatty acids 3) Eggs- increases serotonin and dopamine 4) Pumpkin seeds- high in omega-3 fatty acids 5) dark chocolate- high in flavanols that increase blood flow to brain 6) turmeric- take with black pepper to increase absorption 7) chamomile tea- antioxidant and anti-inflammatory properties 8) yogurt without sugar- supports gut-brain axis 9)  green tea- contains L- theanine 10) blueberries- high in vitamin C and antioxidants 11) t[place] high in tryptophan which gets converted to serotonin 12) bell peppers- rich in vitamin C and antioxidants 13) citrus fruits- rich in vitamin C and antioxidants 14) almonds- high in vitamin E and healthy fats 15) chia seeds- high in omega-3 fatty acids  9) Diabetes type 2 -HgbA1c reviewed- 6.7 -continue Mounjaro, discussed that nausea is a common side effect and usually shortlived.  -check CBGs daily, log, and bring log to follow-up appointment -avoid sugar, bread, pasta, rice -avoid snacking -try to incorporate into your diet some  of the following foods which are good for diabetes: 1) cinnamon- imitates effects of insulin, increasing glucose transport into cells ([place] or [place] cinnamon is best, least processed) 2) nuts- can slow down the blood sugar response of carbohydrate rich foods 3) oatmeal- contains and anti-inflammatory compound avenanthramide 4) whole-milk yogurt (best types are no sugar, [place] yogurt, or goat/sheep yogurt) 5) beans- high in protein, fiber, and vitamins, low glycemic index 6) broccoli- great source of vitamin A and C 7) quinoa- higher in protein and fiber than other grains 8) spinach- high in vitamin A, fiber, and protein 9) olive oil- reduces glucose levels, LDL, and triglycerides 10) salmon- excellent amount of omega-3-fatty acids 11) walnuts- rich in antioxidants 12) apples- high in fiber and quercetin 13) carrots- highly nutritious with low impact on blood sugar 14) eggs- improve HDL (good cholesterol), high in protein, keep you satiated 15) turmeric: improves blood sugars, cardiovascular disease, and protects kidney health 16) garlic: improves blood sugar, blood pressure, pain 17) tomatoes: highly nutritious with low impact on blood sugar   10) Obesity: -Educated regarding health benefits of weight loss- for pain, general health, chronic disease  prevention, immune health, mental health.  -prescribed magnesium gluconate '250mg'$  HS -Will monitor weight every visit.  -Consider [place] tea daily.  -Discussed the benefits of intermittent fasting. -Discussed foods that can assist in weight loss: 1) leafy greens- high in fiber and nutrients 2) dark chocolate- improves metabolism (if prefer sweetened, best to sweeten with honey instead of sugar).  3) cruciferous vegetables- high in fiber and protein 4) full fat yogurt: high in healthy fat, protein, calcium, and probiotics 5) apples- high in a variety of phytochemicals 6) nuts- high in fiber and protein that increase feelings of fullness 7) grapefruit: rich in nutrients, antioxidants, and fiber (not to be taken with anticoagulation) 8) beans- high in protein and fiber 9) salmon- has high quality protein and healthy fats 10) green tea- rich in polyphenols 11) eggs- rich in choline and vitamin D 12) tuna- high protein, boosts metabolism 13) avocado- decreases visceral abdominal fat 14) chicken (pasture raised): high in protein and iron 15) blueberries- reduce abdominal fat and cholesterol 16) whole grains- decreases calories retained during digestion, speeds metabolism 17) chia seeds- English appetite 18) chilies- increases fat metabolism  -Discussed supplements that can be used:  1) Metatrim '400mg'$  BID 30 minutes before breakfast and dinner  2) Sphaeranthus indicus and Garcinia mangostana (combinations of these and #1 can be found in capsicum and zychrome  3) green coffee bean extract '400mg'$  twice per day or Irvingia (african mango) 150 to '300mg'$  twice per day.  11) Left lumbar radiculopathy: -NexWave Zynex IFS ordered -Provided with a pain relief journal and discussed that it contains foods and lifestyle tips to naturally help to improve pain. Discussed that these lifestyle strategies are also very good for health unlike some medications which can have negative side effects. Discussed that the  act of keeping a journal can be therapeutic and helpful to realize patterns what helps to trigger and alleviate pain.   -MRI ordered of lumbar spine, discussed suspected diagnosis of lumbar radiculopathy given lower back pain radiating into lower extremity. Unfortunately, looks like she will be unable to get MRI due to her spinal cord stimulator. Referred to [facility] for removal of spinal cord stimulator. CT lumbar spine ordered [facility] at [facility] given new neurological deficits of decreased sensation. Discussed going to ED if neurological symptoms worsen -continue amitriptyline '10mg'$  HS, discussed that this could potentially help  her sleep better at night without having to take a percocet at this time -discontinued meloxicam since not helping much and has several potential side effects. -reviewed the results of her CT lumbar spine.  Discussed that there are no significant concerning findings.  -discussed I will call her today once Mri results are available.  -f/u with Dr. Gean Quint about removal of the spinal cord stimulator, discussed with [employer] rep trying to expedite this removal as this is very important to her -refilled her oxycodone -called Dr. Keene Breath office at [facility] to see if we can get records sent to Dr. Tretha Sciara office -Hopefully getting MRI will be helpful to Dr. Gean Quint for spinal cord stimulator removal well.   12) Nausea resolved

## 2022-08-12 ENCOUNTER — Other Ambulatory Visit: Payer: Self-pay | Admitting: Physical Medicine and Rehabilitation

## 2022-08-12 MED ORDER — TOPIRAMATE 25 MG PO TABS: 25.0000 mg | ORAL_TABLET | Freq: Two times a day (BID) | ORAL | 11 refills | Status: DC

## 2022-08-12 MED ORDER — AMITRIPTYLINE HCL 10 MG PO TABS: ORAL_TABLET | ORAL | 0 refills | Status: DC

## 2022-08-15 ENCOUNTER — Telehealth: Payer: Self-pay

## 2022-08-15 DIAGNOSIS — E119 Type 2 diabetes mellitus without complications: Secondary | ICD-10-CM | POA: Diagnosis not present

## 2022-08-15 LAB — HM DIABETES EYE EXAM

## 2022-08-15 NOTE — Telephone Encounter (Signed)
Lauren at [facility] would like a call back concerning recent surgery for patient.  Cb# [phone].  Please advise.  Thank you

## 2022-08-15 NOTE — Telephone Encounter (Signed)
Tried to call Mitcheal Sweetin back. LMOM for her to call me back if I can assist with anything.

## 2022-08-19 ENCOUNTER — Telehealth: Payer: Self-pay

## 2022-08-19 DIAGNOSIS — M961 Postlaminectomy syndrome, not elsewhere classified: Secondary | ICD-10-CM | POA: Diagnosis not present

## 2022-08-19 NOTE — Telephone Encounter (Signed)
When is her spinal cord stimulator scheduled to come out?

## 2022-08-19 NOTE — Telephone Encounter (Signed)
Jane English from [facility] called. She is trying to get a verbal clearance for patient. Patient is currently out of work until end of October s/p knee scope that Dr.Xu performed on 07/25/2022. Patient wants to have her spinal cord stimulator removed while already out of work. I will ask Dr.Xu and called Jane English back at [phone] ext [phone]

## 2022-08-20 NOTE — Telephone Encounter (Signed)
Yes it's ok to get the spinal cord stimulator out.

## 2022-08-20 NOTE — Telephone Encounter (Signed)
Called and let Jane English know.

## 2022-08-27 ENCOUNTER — Other Ambulatory Visit: Payer: Self-pay | Admitting: Family Medicine

## 2022-08-29 ENCOUNTER — Ambulatory Visit (INDEPENDENT_AMBULATORY_CARE_PROVIDER_SITE_OTHER): Payer: Medicare HMO | Admitting: Orthopaedic Surgery

## 2022-08-29 ENCOUNTER — Encounter: Payer: Self-pay | Admitting: Orthopaedic Surgery

## 2022-08-29 DIAGNOSIS — M1712 Unilateral primary osteoarthritis, left knee: Secondary | ICD-10-CM

## 2022-08-29 DIAGNOSIS — S83282A Other tear of lateral meniscus, current injury, left knee, initial encounter: Secondary | ICD-10-CM

## 2022-08-29 NOTE — Progress Notes (Signed)
Post-Op Visit Note   Patient: Jane English           Date of Birth: Jun 29, 1971           MRN: [id number] Visit Date: 08/29/2022 PCP: Fayrene Helper, MD   Assessment & Plan:  Chief Complaint:  Chief Complaint  Patient presents with   Left Knee - Follow-up    Left knee scope 07/25/2022   Visit Diagnoses:  1. Acute lateral meniscus tear of left knee, initial encounter   2. Primary osteoarthritis of left knee     Plan: Patient is now 5 weeks status post left knee arthroscopy and lateral meniscectomy.  She is improving overall.  She feels some start up stiffness and she feels that she is ready to go back to work.  Examination left knee shows a fully healed surgical scars.  She has fluid painless range of motion of the knee.  No joint line tenderness.  Patient has recovered well from the surgery.  She will continue to work on strengthening and increase activity as tolerated.  We will see her back as needed.  Follow-Up Instructions: No follow-ups on file.   Orders:  No orders of the defined types were placed in this encounter.  No orders of the defined types were placed in this encounter.   Imaging: No results found.  PMFS History: Patient Active Problem List   Diagnosis Date Noted   Primary osteoarthritis of left knee 08/01/2022   Acute lateral meniscus tear of left knee 07/05/2022   Type 2 diabetes mellitus with other specified complication ([place]) 69/48/5462   Anxiety 12/16/2020   Left foot pain 05/01/2020   Great toe pain, left 05/01/2020   Back spasm 04/10/2020   Urinary urgency 08/11/2019   Overweight (BMI 25.0-29.9) 04/26/2019   GAD (generalized anxiety disorder) 04/26/2019   Arthralgia of left shoulder region 11/05/2018   Depression, major, single episode, severe ([place]) 10/03/2018   Vitamin D deficiency 12/14/2017   Bilateral ankle pain 04/16/2017   Amenorrhea 10/15/2016   Paresthesia 05/06/2016   Back pain with radiation 04/04/2016   Obesity (BMI  30.0-34.9) 03/25/2016   Neuropathic pain of finger of right hand 12/31/2015   Pap smear of cervix shows high risk HPV present 08/08/2015   Acute bronchitis 05/28/2015   Primary osteoarthritis of right knee 03/17/2015   Cyst of left ovary 02/06/2015   Anovulation 02/06/2015   Secondary amenorrhea 02/06/2015   Intractable chronic migraine without aura and without status migrainosus 12/21/2014   Migraine 09/21/2014   Heart murmur 05/13/2014   Abdominal adhesions 02/15/2014   Anemia 10/11/2013   H. pylori duodenitis 07/07/2013   PTSD (post-traumatic stress disorder) 03/26/2013   Insomnia 03/18/2013   Allergic conjunctivitis 12/10/2012   GERD (gastroesophageal reflux disease) 10/29/2012   Chronic pain syndrome 10/06/2012   Postlaminectomy syndrome 10/06/2012   Cervical neck pain with evidence of disc disease 04/13/2012   Allergic rhinitis 04/17/2010   Cough variant asthma 03/04/2010   Past Medical History:  Diagnosis Date   Anemia    Asthma    Asthma flare 04/09/2013   Back pain    Bronchitis    Chronic abdominal pain    Chronic constipation    Constipation due to opioid therapy    Depression    Depression, major, single episode, severe ([place]) 10/03/2018   PHQ 9 score of 15   Diabetes mellitus without complication ([place])    Diabetes mellitus, type II ([place])    DVT (deep venous thrombosis) ([place])  2010   GERD (gastroesophageal reflux disease)    Heart murmur    no cardiologist   Helicobacter pylori gastritis 06/11/2013   Colonoscopy Dr. Hilarie Fredrickson   Hypertension    IBS (irritable bowel syndrome)    Migraine headache    Neuropathy    Obesity    Obsessive-compulsive disorder    PSYCHOTIC D/O W/HALLUCINATIONS CONDS CLASS ELSW 03/04/2010   Qualifier: Diagnosis of  By: Moshe Cipro MD, Joycelyn Schmid     PTSD (post-traumatic stress disorder)    SBO (small bowel obstruction) ([place]) 08/09/2013   Seasonal allergies 12/10/2012   Seizures ([place])    Shortness of breath     Family History  Problem  Relation Age of Onset   Physical abuse Mother    Alcohol abuse Mother    Cirrhosis Mother    Lung cancer Father    Stomach cancer Father    Esophageal cancer Father    Alcohol abuse Father    Mental illness Father    Diabetes Sister    Hypertension Sister    Bipolar disorder Sister    Schizophrenia Sister    Diabetes Sister    Drug abuse Sister    HIV Sister    Pneumonia Sister        died as a baby   Alcohol abuse Brother    Hypertension Brother    Kidney disease Brother    Diabetes Brother    Drug abuse Brother    Mental illness Brother    Alcohol abuse Brother    Alcohol abuse Brother    Hypertension Brother    Diabetes Brother    Alcohol abuse Brother    Mental illness Brother        in [place]   Alcohol abuse Brother    Alcohol abuse Brother    Bipolar disorder Brother    Hypertension Brother    Bipolar disorder Brother    Drug abuse Brother    Alcohol abuse Brother    Bipolar disorder Brother    Bipolar disorder Daughter    Bipolar disorder Son    Bipolar disorder Son    ADD / ADHD Neg Hx    Anxiety disorder Neg Hx    Dementia Neg Hx    Depression Neg Hx    OCD Neg Hx    Seizures Neg Hx    Paranoid behavior Neg Hx    Colon cancer Neg Hx    Rectal cancer Neg Hx     Past Surgical History:  Procedure Laterality Date   ANTERIOR CERVICAL DECOMP/DISCECTOMY FUSION  07/07/2012   Procedure: ANTERIOR CERVICAL DECOMPRESSION/DISCECTOMY FUSION 2 LEVELS;  Surgeon: Floyce Stakes, MD;  Location: [facility];  Service: Neurosurgery;  Laterality: N/A;  Cervical four-five, five - six  Anterior cervical decompression/diskectomy/fusion/plate   APPENDECTOMY  1986   BOWEL RESECTION N/A 07/29/2013   Procedure: serosal repair;  Surgeon: Adin Hector, MD;  Location: [facility];  Service: General;  Laterality: N/A;   CARPAL TUNNEL RELEASE Bilateral    COLON SURGERY N/A    [employer]rees[employer] 07/29/2020   LAPAROSCOPY N/A 07/29/2013   Procedure: diagnostic laporoscopy;  Surgeon: Adin Hector, MD;  Location: [facility];  Service: General;  Laterality: N/A;   LAPAROSCOPY N/A 08/16/2013   Procedure: LAPAROSCOPY DIAGNOSTIC/LYSIS OF ADHESIONS;  Surgeon: Adin Hector, MD;  Location: [facility];  Service: General;  Laterality: N/A;   LAPAROTOMY N/A 08/16/2013   Procedure: EXPLORATORY LAPAROTOMY/SMALL BOWEL RESECTION (JEJUNUM);  Surgeon: Adin Hector, MD;  Location:  [facility];  Service: General;  Laterality: N/A;   LUMBAR SPINE SURGERY  2010   x 3   LYSIS OF ADHESION  2003   Dr. Irving Shows   LYSIS OF ADHESION N/A 07/29/2013   Procedure: LYSIS OF ADHESION;  Surgeon: Adin Hector, MD;  Location: [facility];  Service: General;  Laterality: N/A;   [place]?   Linna Hoff, [place]   SPINAL CORD STIMULATOR IMPLANT     SPINE SURGERY N/A    [employer]rees[employer] 07/29/2020   TRIGGER FINGER RELEASE  2009   right pinkie finger   TUBAL LIGATION  1994   Social History   Occupational History   Occupation: disabled  Tobacco Use   Smoking status: Never   Smokeless tobacco: Never  Vaping Use   Vaping Use: Never used  Substance and Sexual Activity   Alcohol use: No   Drug use: No   Sexual activity: Not Currently    Partners: Male    Birth control/protection: Surgical    Comment: tubal

## 2022-09-03 ENCOUNTER — Ambulatory Visit: Payer: Medicare HMO | Admitting: Physical Medicine and Rehabilitation

## 2022-09-05 ENCOUNTER — Ambulatory Visit: Payer: Medicare HMO | Admitting: Physical Medicine and Rehabilitation

## 2022-09-06 ENCOUNTER — Encounter: Payer: Self-pay | Admitting: Physical Medicine and Rehabilitation

## 2022-09-06 ENCOUNTER — Encounter: Payer: Medicare HMO | Attending: Physical Medicine and Rehabilitation | Admitting: Physical Medicine and Rehabilitation

## 2022-09-06 VITALS — BP 110/74 | HR 88 | Ht 66.0 in | Wt 163.6 lb

## 2022-09-06 DIAGNOSIS — G4701 Insomnia due to medical condition: Secondary | ICD-10-CM | POA: Diagnosis not present

## 2022-09-06 DIAGNOSIS — M5416 Radiculopathy, lumbar region: Secondary | ICD-10-CM | POA: Diagnosis not present

## 2022-09-06 DIAGNOSIS — M1711 Unilateral primary osteoarthritis, right knee: Secondary | ICD-10-CM | POA: Diagnosis not present

## 2022-09-06 MED ORDER — TOPIRAMATE 25 MG PO TABS: 25.0000 mg | ORAL_TABLET | Freq: Two times a day (BID) | ORAL | 11 refills | Status: DC

## 2022-09-06 MED ORDER — HYLAN G-F 20 16 MG/2ML IX SOSY
16.0000 mg | PREFILLED_SYRINGE | Freq: Once | INTRA_ARTICULAR | Status: AC
  Administered 2022-09-06: 16 mg via INTRA_ARTICULAR

## 2022-09-06 MED ORDER — AMITRIPTYLINE HCL 10 MG PO TABS
ORAL_TABLET | ORAL | 0 refills | Status: DC
Start: 2022-09-06 — End: 2023-02-03

## 2022-09-06 MED ORDER — MELOXICAM 7.5 MG PO TABS: 7.5000 mg | ORAL_TABLET | Freq: Two times a day (BID) | ORAL | 11 refills | Status: DC | PRN

## 2022-09-06 MED ORDER — OXYCODONE HCL 5 MG PO TABS: 10.0000 mg | ORAL_TABLET | Freq: Two times a day (BID) | ORAL | 0 refills | Status: DC

## 2022-09-06 NOTE — Progress Notes (Addendum)
Subjective:    Patient ID: Jane English, female    DOB: Sep 22, 1971, 51 y.o.   MRN: [id number]  HPI:    Jane English is a 51 y.o. female who returns for f/u appointment for chronic pain in her back secondary to failed back syndrome, bilateral knees secondary to chonrdomalacia of bilateral knees, pain in bilateral hands secondary to osteoarthritis, left sided lower back pain  1) Failed back syndrome -she would like to get her spinal cord stimulator removed, discussed that I have discussed with the [employer] rep -once she gets moving pain is better, but she feels grinding with movement -got her MRI done yesterday -she has called Dr. Keene Breath office and the records have not been called, I called today to see if this can be expedited.  -needs refill of oxycodone today -pain has been stable -she continues to have severe pain -she has been using the 7.'5mg'$  meloxicam up to three times per day -she says she needs [place] to reach out to her former pain clinic to help get her medical records -Her current exercise regimen is walking and performing stretching exercises. -she is reporting decreased sensation in left lower extremity compared to right -pain is still severe despite starting meloxicam and robaxin -her hardware is MRI compatible.  -she asks when she get her spinal cord stimulator removed. She does not want to go back to her old Psychologist, sport and exercise -Last UDS was Performed on 01/26/2021, it was consistent.  -She has been taking oxycodone PRN (30MME) for years- it is helpful for her pain -she has had negative responses with Gabapentin and Lyrica.  -pain has been really severe recently.  -she has never tried Cymbalta.  -she has been experiencing more left sided low back pain since she has started working at [employer] -no more falls.  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 -she asks if a lady reached out to me  about the stimulator machine, she would like to get her spinal cord stimulator removed. She does not want to get this removed where it was originally placed, but would rather get it removed at [place]  2) Chondromalacia of bilateral knees/ left lateral meniscal injury -had knee surgery on the 21st, her son said it lasted 10 minutes -she was sent hydrocodone but has not taken it -has been taking her long standing oxycodone.  -stitches removed -she ordered herself an exercise stepper  -she was told not to overdo her exercise  -she is on the phone with her insurance company regarding viscosupplementation coverage- she has had this done several times and it has been helpful.   3) Anxiety -she receives Alprazolam from Dr. Moshe Cipro, PCP- dose was recently reduced in half.  [name] has discussed the black box warning of using opioids and benzodiazepines and highlighted the dangers of using these drugs together and discussed the adverse events including respiratory suppression, overdose, cognitive impairment and importance of compliance with current regimen.  -she has been referred to therapy by her PCP.   4) Osteoarthritis of bilateral hands  5) Right buttock pain  6) Myofascial pain  7) Chronic constipation: -has followed with GI since last visit  8) Prediabetes -she was advised by GI to discuss Wegovy vs. Ozempic with her PCP for weight loss- will also help with diabetes.  -there is plan for repeat colostomy- she had a bad experience with this last time.  -  taking Monjouro -she lost weight from 190s to 180s with this -she does not eat as much with this -she gets sick when she eats things too sweet  9) Nausea: she does feel some some nausea with her pain medications.   10) Low mood -at times -she is handling at well.  -She loves to meet new people through her work.  -she has started a new piercing job.   11) Left lumbar radiculopathy: - it is currently really bothering her   -the meloxicam and muscle relaxers help temporarily but not for long -it is severe all the time but she is still working through it -she is unable to have an MRI due to the spinal cord stimulator- she would like to get this removed as soon as possible - she continues on oxycodone -she purchased an over the counter heating oil but has not tried that -she cannot tolerate gabapentin since it has caused her to sleep walk before -she has had a hard time going to work due to the pain -she has tried amitriptyline in the past but was started on '150mg'$  and this made her feel drunk. She has not tried '10mg'$ .  -pain has been very severe -feels shooting pain throughout left leg -notes weakness -working through the pain.  -she is more interested in surgery than injections as she has not had benefit from injections before.   12) Left foot swelling and numbness: -she feels that when she applies heat it improves the numbness  13) Insomnia -she has been sleeping better since starting Amitriptyline   Pain Inventory Average Pain 10 Pain Right Now 8 My pain is constant, sharp, burning, dull, stabbing, tingling, and aching  In the last 24 hours, has pain interfered with the following? General activity 8 Relation with others 10 Enjoyment of life 8 What TIME of day is your pain at its worst? Morning, evening and night Sleep (in general) Fair  Pain is worse with: walking, bending, sitting, inactivity, standing, and some activites Pain improves with: heat/ice and medication Relief from Meds: 9  Family History  Problem Relation Age of Onset   Physical abuse Mother    Alcohol abuse Mother    Cirrhosis Mother    Lung cancer Father    Stomach cancer Father    Esophageal cancer Father    Alcohol abuse Father    Mental illness Father    Diabetes Sister    Hypertension Sister    Bipolar disorder Sister    Schizophrenia Sister    Diabetes Sister    Drug abuse Sister    HIV Sister    Pneumonia  Sister        died as a baby   Alcohol abuse Brother    Hypertension Brother    Kidney disease Brother    Diabetes Brother    Drug abuse Brother    Mental illness Brother    Alcohol abuse Brother    Alcohol abuse Brother    Hypertension Brother    Diabetes Brother    Alcohol abuse Brother    Mental illness Brother        in [place]   Alcohol abuse Brother    Alcohol abuse Brother    Bipolar disorder Brother    Hypertension Brother    Bipolar disorder Brother    Drug abuse Brother    Alcohol abuse Brother    Bipolar disorder Brother    Bipolar disorder Daughter    Bipolar disorder Son    Bipolar  disorder Son    ADD / ADHD Neg Hx    Anxiety disorder Neg Hx    Dementia Neg Hx    Depression Neg Hx    OCD Neg Hx    Seizures Neg Hx    Paranoid behavior Neg Hx    Colon cancer Neg Hx    Rectal cancer Neg Hx    Social History   Socioeconomic History   Marital status: Married    Spouse name: Herbie Baltimore   Number of children: 4   Years of education: 12   Highest education level: Some college, no degree  Occupational History   Occupation: disabled  Tobacco Use   Smoking status: Never   Smokeless tobacco: Never  Vaping Use   Vaping Use: Never used  Substance and Sexual Activity   Alcohol use: No   Drug use: No   Sexual activity: Not Currently    Partners: Male    Birth control/protection: Surgical    Comment: tubal  Other Topics Concern   Not on file  Social History Narrative   Not on file   Social Determinants of Health   Financial Resource Strain: Low Risk  (11/08/2021)   Overall Financial Resource Strain (CARDIA)    Difficulty of Paying Living Expenses: Not hard at all  Food Insecurity: No Food Insecurity (11/08/2021)   Hunger Vital Sign    Worried About Running Out of Food in the Last Year: Never true    Ran Out of Food in the Last Year: Never true  Transportation Needs: No Transportation Needs (11/08/2021)   PRAPARE - Hydrologist  (Medical): No    Lack of Transportation (Non-Medical): No  Physical Activity: Insufficiently Active (11/08/2021)   Exercise Vital Sign    Days of Exercise per Week: 3 days    Minutes of Exercise per Session: 30 min  Stress: No Stress Concern Present (11/08/2021)   [place]    Feeling of Stress : Only a little  Social Connections: Moderately Isolated (11/08/2021)   Social Connection and Isolation Panel [NHANES]    Frequency of Communication with Friends and Family: More than three times a week    Frequency of Social Gatherings with Friends and Family: Once a week    Attends Religious Services: Never    Marine scientist or Organizations: No    Attends Music therapist: Never    Marital Status: Married   Past Surgical History:  Procedure Laterality Date   ANTERIOR CERVICAL DECOMP/DISCECTOMY FUSION  07/07/2012   Procedure: ANTERIOR CERVICAL DECOMPRESSION/DISCECTOMY FUSION 2 LEVELS;  Surgeon: Floyce Stakes, MD;  Location: [facility];  Service: Neurosurgery;  Laterality: N/A;  Cervical four-five, five - six  Anterior cervical decompression/diskectomy/fusion/plate   APPENDECTOMY  1986   BOWEL RESECTION N/A 07/29/2013   Procedure: serosal repair;  Surgeon: Adin Hector, MD;  Location: [facility];  Service: General;  Laterality: N/A;   CARPAL TUNNEL RELEASE Bilateral    COLON SURGERY N/A    Phreesia 07/29/2020   LAPAROSCOPY N/A 07/29/2013   Procedure: diagnostic laporoscopy;  Surgeon: Adin Hector, MD;  Location: [facility];  Service: General;  Laterality: N/A;   LAPAROSCOPY N/A 08/16/2013   Procedure: LAPAROSCOPY DIAGNOSTIC/LYSIS OF ADHESIONS;  Surgeon: Adin Hector, MD;  Location: [facility];  Service: General;  Laterality: N/A;   LAPAROTOMY N/A 08/16/2013   Procedure: EXPLORATORY LAPAROTOMY/SMALL BOWEL RESECTION (JEJUNUM);  Surgeon: Adin Hector, MD;  Location: [facility];  Service: General;  Laterality: N/A;    LUMBAR SPINE SURGERY  2010   x 3   LYSIS OF ADHESION  2003   Dr. Irving Shows   LYSIS OF ADHESION N/A 07/29/2013   Procedure: LYSIS OF ADHESION;  Surgeon: Adin Hector, MD;  Location: [facility];  Service: General;  Laterality: N/A;   [place]?   [place], [place]   SPINAL CORD STIMULATOR IMPLANT     SPINE SURGERY N/A    Phreesia 07/29/2020   TRIGGER FINGER RELEASE  2009   right pinkie finger   TUBAL LIGATION  1994   Past Surgical History:  Procedure Laterality Date   ANTERIOR CERVICAL DECOMP/DISCECTOMY FUSION  07/07/2012   Procedure: ANTERIOR CERVICAL DECOMPRESSION/DISCECTOMY FUSION 2 LEVELS;  Surgeon: Floyce Stakes, MD;  Location: [facility];  Service: Neurosurgery;  Laterality: N/A;  Cervical four-five, five - six  Anterior cervical decompression/diskectomy/fusion/plate   APPENDECTOMY  1986   BOWEL RESECTION N/A 07/29/2013   Procedure: serosal repair;  Surgeon: Adin Hector, MD;  Location: [facility];  Service: General;  Laterality: N/A;   CARPAL TUNNEL RELEASE Bilateral    COLON SURGERY N/A    Phreesia 07/29/2020   LAPAROSCOPY N/A 07/29/2013   Procedure: diagnostic laporoscopy;  Surgeon: Adin Hector, MD;  [facility];  Service: General;  Laterality: N/A;   LAPAROSCOPY N/A 08/16/2013   Procedure: LAPAROSCOPY DIAGNOSTIC/LYSIS OF ADHESIONS;  Surgeon: Adin Hector, MD;  [facility];  Service: General;  Laterality: N/A;   LAPAROTOMY N/A 08/16/2013   Procedure: EXPLORATORY LAPAROTOMY/SMALL BOWEL RESECTION (JEJUNUM);  Surgeon: Adin Hector, MD;  [facility];  Service: General;  Laterality: N/A;   LUMBAR SPINE SURGERY  2010   x 3   LYSIS OF ADHESION  2003   Dr. Irving Shows   LYSIS OF ADHESION N/A 07/29/2013   Procedure: LYSIS OF ADHESION;  Surgeon: Adin Hector, MD;  [facility];  Service: General;  Laterality: N/A;   [place]?   [place], [place]   SPINAL CORD STIMULATOR IMPLANT      SPINE SURGERY N/A    Phreesia 07/29/2020   TRIGGER FINGER RELEASE  2009   right pinkie finger   TUBAL LIGATION  1994   Past Medical History:  Diagnosis Date   Anemia    Asthma    Asthma flare 04/09/2013   Back pain    Bronchitis    Chronic abdominal pain    Chronic constipation    Constipation due to opioid therapy    Depression    Depression, major, single episode, severe ([place]) 10/03/2018   PHQ 9 score of 15   Diabetes mellitus without complication ([place])    Diabetes mellitus, type II ([place])    DVT (deep venous thrombosis) ([place]) 2010   GERD (gastroesophageal reflux disease)    Heart murmur    no cardiologist   Helicobacter pylori gastritis 06/11/2013   Colonoscopy Dr. Hilarie Fredrickson   Hypertension    IBS (irritable bowel syndrome)    Migraine headache    Neuropathy    Obesity    Obsessive-compulsive disorder    PSYCHOTIC D/O W/HALLUCINATIONS CONDS CLASS ELSW 03/04/2010   Qualifier: Diagnosis of  By: Moshe Cipro MD, Margaret     PTSD (post-traumatic stress disorder)    SBO (small bowel obstruction) ([place]) 08/09/2013   Seasonal allergies 12/10/2012   Seizures ([place])    Shortness  of breath    BP 110/74   Pulse 88   Ht '5\' 6"'$  (1.676 m)   Wt 163 lb 9.6 oz (74.2 kg)   SpO2 97%   BMI 26.41 kg/m   Opioid Risk Score:   Fall Risk Score:  `1  Depression screen [facility] 2/9     09/06/2022    9:45 AM 08/06/2022   10:01 AM 06/20/2022    9:05 AM 05/30/2022   11:19 AM 03/21/2022   11:02 AM 03/12/2022   11:25 AM 02/05/2022    8:47 AM  Depression screen PHQ 2/9  Decreased Interest 0 0 0 0 0 0 0  Down, Depressed, Hopeless 0 0 0 0 0 0 0  PHQ - 2 Score 0 0 0 0 0 0 0    Review of Systems  Constitutional: Negative.   HENT: Negative.    Eyes: Negative.   Respiratory: Negative.    Cardiovascular: Negative.   Gastrointestinal: Negative.   Endocrine: Negative.   Genitourinary: Negative.   Musculoskeletal:  Positive for back pain.       Pain in both knee & both shoulders  Skin: Negative.    Allergic/Immunologic: Negative.   Neurological: Negative.   Hematological: Negative.   All other systems reviewed and are negative.      Objective:  Gen: no distress, normal appearing, weight 163 lbs, BMI 26.41 HEENT: oral mucosa pink and moist, NCAT Cardio: Reg rate Chest: normal effort, normal rate of breathing Abd: soft, non-distended Ext: no edema Psych: pleasant, normal affect Skin: intact MSK: ambulating without AD    Assessment & Plan:  1. Chronic Pain of Bilateral Shoulders L>R: Continue HEP as Tolerated. Continue to Monitor.  2. Failed Back Syndrome of Lumbar Syndrome : Continue HEP as Tolerated. Continue current medication regimen. Continue to Monitor. Asked for phone number of [employer] rep and I will call her today to discuss removal of her spinal cord stimulator which is no longer functional.  -continue applying deep blue -called Manuela Schwartz [employer] rep to let her know patient would like spinal cord stimulator removed and to ask how I can help with this. She recommended referral to Dr. Davy Pique at [facility]. Discussed that I have already referred patient there and she has not heard from them. Placed new referral today and Manuela Schwartz will reach out to Dr. Davy Pique as well.  -Appreciate Manuela Schwartz communicating with [facility] that her current spinal cord stimulator is MRI compatible. -continue percocet BID PRN Prescribed robaxin and meloxicam. Discussed risks and benefits.  -Discussed current symptoms of pain and history of pain.  -Refilled oxycodone -Discussed benefits of exercise in reducing pain. -Discussed following foods that may reduce pain: 1) Ginger (especially studied for arthritis)- reduce leukotriene production to decrease inflammation 2) Blueberries- high in phytonutrients that decrease inflammation 3) Salmon- marine omega-3s reduce joint swelling and pain 4) Pumpkin seeds- reduce inflammation 5) dark chocolate- reduces inflammation 6) turmeric-  reduces inflammation 7) tart cherries - reduce pain and stiffness 8) extra virgin olive oil - its compound olecanthal helps to block prostaglandins  9) chili peppers- can be eaten or applied topically via capsaicin 10) mint- helpful for headache, muscle aches, joint pain, and itching 11) garlic- reduces inflammation  Link to further information on diet for chronic pain: [email or web address]  -Provided with a pain relief journal and discussed that it contains foods and lifestyle tips to naturally help to improve pain. Discussed that these lifestyle strategies are also very good for health unlike some medications which can  have negative side effects. Discussed that the act of keeping a journal can be therapeutic and helpful to realize patterns what helps to trigger and alleviate pain.  l 3. Chronic Lower Back Pain without Sciatica: Continue HEP as Tolerated. Continue to Monitor. Referred to [facility] therapy -texted her exercises she can do to improve spinal alignment and improve pain -discussed gabapentin but she cannot use this due to history of sleepwalking on this medicine -recommended trying doTerra Deep Blue essential oil -discussed that it is ok for use to provide an early refill of oxycodone this times if she needs due to her increased pain 4. Left Knee Pain: No Complaints today. Continue HEP as Tolerated. Continue to Monitor. Discussed Wobenzymes.  5. Muscle Spasm of Shoulders/ Upper Back : Continue Flexeril. Continue to Monitor. 6. Chronic Pain Syndrome: Refilled: Oxycodone '5mg'$ /325 mg two tablets twice a day #120. We will continue the opioid monitoring program, this consists of regular clinic visits, examinations, urine drug screen, pill counts as well as use of [place] Controlled Substance Reporting system.   7. Constipation:  -continue Linzess as prescribed by GI -Provided list of following foods that help with  constipation and highlighted a few: 1) prunes- contain high amounts of fiber.  2) apples- has a form of dietary fiber called pectin that accelerates stool movement and increases beneficial gut bacteria 3) pears- in addition to fiber, also high in fructose and sorbitol which have laxative effect 4) figs- contain an enzyme ficin which helps to speed colonic transit 5) kiwis- contain an enzyme actinidin that improves gut motility and reduces constipation 6) oranges- rich in pectin (like apples) 7) grapefruits- contain a flavanol naringenin which has a laxative effect 8) vegetables- rich in fiber and also great sources of folate, vitamin C, and K 9) artichoke- high in inulin, prebiotic great for the microbiome 10) chicory- increases stool frequency and softness (can be added to coffee) 11) rhubarb- laxative effect 12) sweet potato- high fiber 13) beans, peas, and lentils- contain both soluble and insoluble fiber 14) chia seeds- improves intestinal health and gut flora 15) flaxseeds- laxative effect 16) whole grain rye bread- high in fiber 17) oat bran- high in soluble and insoluble fiber 18) kefir- softens stools -recommended to try at least one of these foods every day.  -drink 6-8 glasses of water per day -walk regularly, especially after meals.   8) Anxiety: -Continue alprazolam as prescribed by Dr. Moshe Cipro. Appreciate Eunice's counseling regarding increased risk of side effects with use of Alprazolam and Oxycodone.  -Discussed exercise and meditation as tools to decrease anxiety. -Recommended Down Dog Yoga app -Discussed spending time outdoors. -Discussed positive re-framing of anxiety.  -Discussed the following foods that have been show to reduce anxiety: 1) [place] nuts, mushrooms, soy beans due to their high selenium content. Upper limit of toxicity of selenium is 427mg/day so no more than 3-4 bBolivianuts per day.  2) Fatty fish such as salmon, mackerel, sardines, trout, and  herring- high in omega-3 fatty acids 3) Eggs- increases serotonin and dopamine 4) Pumpkin seeds- high in omega-3 fatty acids 5) dark chocolate- high in flavanols that increase blood flow to brain 6) turmeric- take with black pepper to increase absorption 7) chamomile tea- antioxidant and anti-inflammatory properties 8) yogurt without sugar- supports gut-brain axis 9) green tea- contains L- theanine 10) blueberries- high in vitamin C and antioxidants 11) t[place] high in tryptophan which gets converted to serotonin 12) bell peppers- rich in vitamin C and antioxidants 13) citrus fruits-  rich in vitamin C and antioxidants 14) almonds- high in vitamin E and healthy fats 15) chia seeds- high in omega-3 fatty acids  9) Diabetes type 2 -HgbA1c reviewed- 6.7 -continue Mounjaro, discussed that nausea is a common side effect and usually shortlived.  -check CBGs daily, log, and bring log to follow-up appointment -avoid sugar, bread, pasta, rice -avoid snacking -try to incorporate into your diet some of the following foods which are good for diabetes: 1) cinnamon- imitates effects of insulin, increasing glucose transport into cells ([place] or [place] cinnamon is best, least processed) 2) nuts- can slow down the blood sugar response of carbohydrate rich foods 3) oatmeal- contains and anti-inflammatory compound avenanthramide 4) whole-milk yogurt (best types are no sugar, [place] yogurt, or goat/sheep yogurt) 5) beans- high in protein, fiber, and vitamins, low glycemic index 6) broccoli- great source of vitamin A and C 7) quinoa- higher in protein and fiber than other grains 8) spinach- high in vitamin A, fiber, and protein 9) olive oil- reduces glucose levels, LDL, and triglycerides 10) salmon- excellent amount of omega-3-fatty acids 11) walnuts- rich in antioxidants 12) apples- high in fiber and quercetin 13) carrots- highly nutritious with low impact on blood sugar 14) eggs- improve HDL (good  cholesterol), high in protein, keep you satiated 15) turmeric: improves blood sugars, cardiovascular disease, and protects kidney health 16) garlic: improves blood sugar, blood pressure, pain 17) tomatoes: highly nutritious with low impact on blood sugar   10) Obesity: -Educated regarding health benefits of weight loss- for pain, general health, chronic disease prevention, immune health, mental health.  -prescribed magnesium gluconate '250mg'$  HS -Will monitor weight every visit.  -Consider [place] tea daily.  -Discussed the benefits of intermittent fasting. -Discussed foods that can assist in weight loss: 1) leafy greens- high in fiber and nutrients 2) dark chocolate- improves metabolism (if prefer sweetened, best to sweeten with honey instead of sugar).  3) cruciferous vegetables- high in fiber and protein 4) full fat yogurt: high in healthy fat, protein, calcium, and probiotics 5) apples- high in a variety of phytochemicals 6) nuts- high in fiber and protein that increase feelings of fullness 7) grapefruit: rich in nutrients, antioxidants, and fiber (not to be taken with anticoagulation) 8) beans- high in protein and fiber 9) salmon- has high quality protein and healthy fats 10) green tea- rich in polyphenols 11) eggs- rich in choline and vitamin D 12) tuna- high protein, boosts metabolism 13) avocado- decreases visceral abdominal fat 14) chicken (pasture raised): high in protein and iron 15) blueberries- reduce abdominal fat and cholesterol 16) whole grains- decreases calories retained during digestion, speeds metabolism 17) chia seeds- English appetite 18) chilies- increases fat metabolism  -Discussed supplements that can be used:  1) Metatrim '400mg'$  BID 30 minutes before breakfast and dinner  2) Sphaeranthus indicus and Garcinia mangostana (combinations of these and #1 can be found in capsicum and zychrome  3) green coffee bean extract '400mg'$  twice per day or Irvingia (african mango)  150 to '300mg'$  twice per day.  11) Left lumbar radiculopathy: -NexWave Zynex IFS ordered -Provided with a pain relief journal and discussed that it contains foods and lifestyle tips to naturally help to improve pain. Discussed that these lifestyle strategies are also very good for health unlike some medications which can have negative side effects. Discussed that the act of keeping a journal can be therapeutic and helpful to realize patterns what helps to trigger and alleviate pain.   -MRI ordered of lumbar spine, discussed suspected diagnosis  of lumbar radiculopathy given lower back pain radiating into lower extremity. Unfortunately, looks like she will be unable to get MRI due to her spinal cord stimulator. Referred to [place] for removal of spinal cord stimulator. CT lumbar spine ordered [facility] at [facility] given new neurological deficits of decreased sensation. Discussed going to ED if neurological symptoms worsen -continue amitriptyline '10mg'$  HS, discussed that this could potentially help her sleep better at night without having to take a percocet at this time -discontinued meloxicam since not helping much and has several potential side effects. -reviewed the results of her CT lumbar spine.  Discussed that there are no significant concerning findings.  -discussed I will call her today once Mri results are available.  -f/u with Dr. Gean Quint about removal of the spinal cord stimulator, discussed with [employer] rep trying to expedite this removal as this is very important to her -refilled her oxycodone -called Dr. Keene Breath office at [facility] to see if we can get records sent to Dr. Tretha Sciara office -Hopefully getting MRI will be helpful to Dr. Gean Quint for spinal cord stimulator removal well.   12) Nausea Resolved  13) Right knee OA -called insurance company to discuss why viscosupplementation was denied. They said no need for auth for Synvisc One Reference ID# [id number]  Right  knee injection  Indication: Right knee pain not relieved by medication management and other conservative care.  Informed consent was obtained after describing risks and benefits of the procedure with the patient, this includes bleeding, bruising, infection and medication side effects. The patient wishes to proceed and has given written consent. The patient was placed in a recumbent position. The medial aspect of the knee was marked and prepped with Betadine and alcohol. It was then entered with a 25-gauge 1-1/2 inch needle and Synvisc One was injected into the joint after negative draw back for blood. The patient tolerated the procedure well. Post procedure instructions were given.    14) Insomnia: -Try to go outside near sunrise -Get exercise during the day.  -Turn off all devices an hour before bedtime.  -Teas that can benefit: chamomile, valerian root, Brahmi (Bacopa) -Can consider over the counter melatonin, magnesium, and/or L-theanine. Melatonin is an anti-oxidant with multiple health benefits. Magnesium is involved in greater than 300 enzymatic reactions in the body and most of [place] are deficient as our soil is often depleted. There are 7 different types of magnesium- Bioptemizer's is a supplement with all 7 types, and each has unique benefits. Magnesium can also help with constipation and anxiety.  -Pistachios naturally increase the production of melatonin -Cozy Earth bamboo bed sheets are free from toxic chemicals.  -Tart cherry juice or a tart cherry supplement can improve sleep and soreness post-workout  -continue amitriptyline.

## 2022-09-06 NOTE — Addendum Note (Signed)
Addended by: Dessa Phi D on: 09/06/2022 10:33 AM   Modules accepted: Orders

## 2022-09-06 NOTE — Patient Instructions (Signed)
Insomnia: -Try to go outside near sunrise -Get exercise during the day.  -Turn off all devices an hour before bedtime.  -Teas that can benefit: chamomile, valerian root, Brahmi (Bacopa) -Can consider over the counter melatonin, magnesium, and/or L-theanine. Melatonin is an anti-oxidant with multiple health benefits. Magnesium is involved in greater than 300 enzymatic reactions in the body and most of us are deficient as our soil is often depleted. There are 7 different types of magnesium- Bioptemizer's is a supplement with all 7 types, and each has unique benefits. Magnesium can also help with constipation and anxiety.  -Pistachios naturally increase the production of melatonin -Cozy Earth bamboo bed sheets are free from toxic chemicals.  -Tart cherry juice or a tart cherry supplement can improve sleep and soreness post-workout   

## 2022-09-10 ENCOUNTER — Telehealth: Payer: Self-pay | Admitting: Physical Medicine and Rehabilitation

## 2022-09-19 ENCOUNTER — Other Ambulatory Visit: Payer: Self-pay | Admitting: Family Medicine

## 2022-09-19 DIAGNOSIS — M961 Postlaminectomy syndrome, not elsewhere classified: Secondary | ICD-10-CM | POA: Diagnosis not present

## 2022-09-19 DIAGNOSIS — J45991 Cough variant asthma: Secondary | ICD-10-CM

## 2022-09-20 ENCOUNTER — Ambulatory Visit (INDEPENDENT_AMBULATORY_CARE_PROVIDER_SITE_OTHER): Payer: Medicare HMO | Admitting: Family Medicine

## 2022-09-20 ENCOUNTER — Encounter: Payer: Self-pay | Admitting: Family Medicine

## 2022-09-20 VITALS — BP 113/74 | HR 94 | Ht 66.0 in | Wt 163.0 lb

## 2022-09-20 DIAGNOSIS — E1169 Type 2 diabetes mellitus with other specified complication: Secondary | ICD-10-CM | POA: Diagnosis not present

## 2022-09-20 DIAGNOSIS — B351 Tinea unguium: Secondary | ICD-10-CM

## 2022-09-20 DIAGNOSIS — F322 Major depressive disorder, single episode, severe without psychotic features: Secondary | ICD-10-CM | POA: Diagnosis not present

## 2022-09-20 DIAGNOSIS — E663 Overweight: Secondary | ICD-10-CM | POA: Diagnosis not present

## 2022-09-20 DIAGNOSIS — F411 Generalized anxiety disorder: Secondary | ICD-10-CM | POA: Diagnosis not present

## 2022-09-20 DIAGNOSIS — J45991 Cough variant asthma: Secondary | ICD-10-CM

## 2022-09-20 MED ORDER — TIRZEPATIDE 7.5 MG/0.5ML ~~LOC~~ SOAJ: 7.5000 mg | SUBCUTANEOUS | 1 refills | Status: DC

## 2022-09-20 NOTE — Patient Instructions (Signed)
F/U in 4 months, call if you need me sooner  Increase Mounjaro dose to 7.5 mg weekly  Albuterol neb refilled  Terbinafine tabs for 6 weeks for fungal infection on left great toe will be sent once the labs are reviewed  Pleas get fasting labs next week, already ordered, read my chart message please  Micrioalb today  Continue lifestyle change to improve health   It is important that you exercise regularly at least 30 minutes 5 times a week. If you develop chest pain, have severe difficulty breathing, or feel very tired, stop exercising immediately and seek medical attention    Thanks for choosing  [facility], we consider it a privelige to serve you.

## 2022-09-22 ENCOUNTER — Encounter: Payer: Self-pay | Admitting: Family Medicine

## 2022-09-22 LAB — MICROALBUMIN / CREATININE URINE RATIO
Creatinine, Urine: 138.6 mg/dL
Microalb/Creat Ratio: 3 mg/g creat (ref 0–29)
Microalbumin, Urine: 4.7 ug/mL

## 2022-09-22 NOTE — Assessment & Plan Note (Signed)
Terbinafine x 6 weeks for fungal toenail will be prescribed after updated lab / check LFT

## 2022-09-22 NOTE — Assessment & Plan Note (Signed)
Managed by psych, and controlled on current meds

## 2022-09-22 NOTE — Assessment & Plan Note (Signed)
Controlled, no change in medication  

## 2022-09-22 NOTE — Assessment & Plan Note (Signed)
  Patient re-educated about  the importance of commitment to a  minimum of 150 minutes of exercise per week as able.  The importance of healthy food choices with portion control discussed, as well as eating regularly and within a 12 hour window most days. The need to choose "clean , green" food 50 to 75% of the time is discussed, as well as to make water the primary drink and set a goal of 64 ounces water daily.       09/20/2022    9:00 AM 09/06/2022    9:44 AM 08/06/2022   10:00 AM  Weight /BMI  Weight 163 lb 163 lb 9.6 oz 164 lb 9.6 oz  Height '5\' 6"'$  (1.676 m) '5\' 6"'$  (1.676 m) '5\' 6"'$  (1.676 m)  BMI 26.31 kg/m2 26.41 kg/m2 26.57 kg/m2

## 2022-09-22 NOTE — Progress Notes (Signed)
   Jane English     MRN: [id number]      DOB: 08-03-1971   HPI Jane English is here for follow up and re-evaluation of chronic medical conditions, medication management and review of any available recent lab and radiology data.  Preventive health is updated, specifically  Cancer screening and Immunization.   Questions or concerns regarding consultations or procedures which the PT has had in the interim are  addressed. The PT denies any adverse reactions to current medications since the last visit.  C/o lack pof weight loss Denies polyuria, polydipsia, blurred vision , or hypoglycemic episodes. C/o thickened left great toenail Denies polyuria, polydipsia, blurred vision , or hypoglycemic episodes.  ROS Denies recent fever or chills. Denies sinus pressure, nasal congestion, ear pain or sore throat. Denies chest congestion, productive cough or wheezing. Denies chest pains, palpitations and leg swelling Denies abdominal pain, nausea, vomiting,diarrhea or constipation.   Denies dysuria, frequency, hesitancy or incontinence. Denies joint pain, swelling and limitation in mobility. Denies headaches, seizures, numbness, or tingling. Denies uncontrolled depression, anxiety or insomnia. Denies skin break down or rash.   PE  BP 113/74 (BP Location: Left Arm, Patient Position: Sitting, Cuff Size: Normal)   Pulse 94   Ht '5\' 6"'$  (1.676 m)   Wt 163 lb (73.9 kg)   SpO2 95%   BMI 26.31 kg/m   Patient alert and oriented and in no cardiopulmonary distress.  HEENT: No facial asymmetry, EOMI,     Neck supple .  Chest: Clear to auscultation bilaterally.  CVS: S1, S2 no murmurs, no S3.Regular rate.  ABD: Soft non tender.   Ext: No edema  MS: Adequate ROM spine, shoulders, hips and knees.  Skin: Intact, no ulcerations or rash noted.  Psych: Good eye contact, normal affect. Memory intact not anxious or depressed appearing.  CNS: CN 2-12 intact, power,  normal throughout.no focal deficits  noted.   Assessment & Plan  Cough variant asthma Controlled, no change in medication   Type 2 diabetes mellitus with other specified complication (HCC) Increase dose of mounjaro Updated lab needed at/ before next visit.   Overweight (BMI 25.0-29.9)  Patient re-educated about  the importance of commitment to a  minimum of 150 minutes of exercise per week as able.  The importance of healthy food choices with portion control discussed, as well as eating regularly and within a 12 hour window most days. The need to choose "clean , green" food 50 to 75% of the time is discussed, as well as to make water the primary drink and set a goal of 64 ounces water daily.       09/20/2022    9:00 AM 09/06/2022    9:44 AM 08/06/2022   10:00 AM  Weight /BMI  Weight 163 lb 163 lb 9.6 oz 164 lb 9.6 oz  Height '5\' 6"'$  (1.676 m) '5\' 6"'$  (1.676 m) '5\' 6"'$  (1.676 m)  BMI 26.31 kg/m2 26.41 kg/m2 26.57 kg/m2      GAD (generalized anxiety disorder) Controlled, no change in medication   Depression, major, single episode, severe ([place]) Managed by psych, and controlled on current meds  Onychomycosis Terbinafine x 6 weeks for fungal toenail will be prescribed after updated lab / check LFT

## 2022-09-22 NOTE — Assessment & Plan Note (Signed)
Increase dose of mounjaro Updated lab needed at/ before next visit.

## 2022-09-23 ENCOUNTER — Other Ambulatory Visit: Payer: Self-pay | Admitting: Family Medicine

## 2022-09-23 ENCOUNTER — Telehealth: Payer: Self-pay | Admitting: Family Medicine

## 2022-09-23 DIAGNOSIS — E1169 Type 2 diabetes mellitus with other specified complication: Secondary | ICD-10-CM | POA: Diagnosis not present

## 2022-09-23 DIAGNOSIS — I10 Essential (primary) hypertension: Secondary | ICD-10-CM | POA: Diagnosis not present

## 2022-09-23 DIAGNOSIS — Z13220 Encounter for screening for lipoid disorders: Secondary | ICD-10-CM | POA: Diagnosis not present

## 2022-09-23 NOTE — Telephone Encounter (Signed)
Patient was told once blood work done to let provider know and she would send in some medication for the fungus on the toe.  Pharmacy: [facility] [place]

## 2022-09-24 LAB — CMP14+EGFR
ALT: 28 IU/L (ref 0–32)
AST: 19 IU/L (ref 0–40)
Albumin/Globulin Ratio: 1.7 (ref 1.2–2.2)
Albumin: 4.5 g/dL (ref 3.8–4.9)
Alkaline Phosphatase: 86 IU/L (ref 44–121)
BUN/Creatinine Ratio: 14 (ref 9–23)
BUN: 15 mg/dL (ref 6–24)
Bilirubin Total: 0.3 mg/dL (ref 0.0–1.2)
CO2: 23 mmol/L (ref 20–29)
Calcium: 9.1 mg/dL (ref 8.7–10.2)
Chloride: 103 mmol/L (ref 96–106)
Creatinine, Ser: 1.06 mg/dL — ABNORMAL HIGH (ref 0.57–1.00)
Globulin, Total: 2.7 g/dL (ref 1.5–4.5)
Glucose: 86 mg/dL (ref 70–99)
Potassium: 3.5 mmol/L (ref 3.5–5.2)
Sodium: 140 mmol/L (ref 134–144)
Total Protein: 7.2 g/dL (ref 6.0–8.5)
eGFR: 63 mL/min/{1.73_m2} (ref >59)

## 2022-09-24 LAB — LIPID PANEL
Chol/HDL Ratio: 2.8 ratio (ref 0.0–4.4)
Cholesterol, Total: 165 mg/dL (ref 100–199)
HDL: 58 mg/dL (ref >39)
LDL Chol Calc (NIH): 88 mg/dL (ref 0–99)
Triglycerides: 108 mg/dL (ref 0–149)
VLDL Cholesterol Cal: 19 mg/dL (ref 5–40)

## 2022-09-24 LAB — HEMOGLOBIN A1C
Est. average glucose Bld gHb Est-mCnc: 123 mg/dL
Hgb A1c MFr Bld: 5.9 % — ABNORMAL HIGH (ref 4.8–5.6)

## 2022-09-29 DIAGNOSIS — M961 Postlaminectomy syndrome, not elsewhere classified: Secondary | ICD-10-CM | POA: Diagnosis not present

## 2022-10-07 ENCOUNTER — Other Ambulatory Visit: Payer: Self-pay | Admitting: Physical Medicine and Rehabilitation

## 2022-10-07 MED ORDER — OXYCODONE HCL 5 MG PO TABS: 10.0000 mg | ORAL_TABLET | Freq: Two times a day (BID) | ORAL | 0 refills | Status: DC

## 2022-10-08 ENCOUNTER — Ambulatory Visit: Payer: Medicare HMO | Admitting: Physical Medicine and Rehabilitation

## 2022-10-10 ENCOUNTER — Encounter: Payer: Self-pay | Admitting: Family Medicine

## 2022-10-10 ENCOUNTER — Telehealth: Payer: Self-pay | Admitting: Family Medicine

## 2022-10-10 MED ORDER — TERBINAFINE HCL 250 MG PO TABS: 250.0000 mg | ORAL_TABLET | Freq: Every day | ORAL | 1 refills | Status: DC

## 2022-10-10 NOTE — Telephone Encounter (Signed)
Patient called said still waiting on the foot fungus cream / pill for her foot.     Pharmacy: [facility] [place]

## 2022-10-10 NOTE — Addendum Note (Signed)
Addended by: Fayrene Helper on: 10/10/2022 06:58 PM   Modules accepted: Orders

## 2022-10-10 NOTE — Telephone Encounter (Signed)
Terbinafine is prescribed for 6 weeks with 1 refill, I will also try to send a pt message

## 2022-10-11 NOTE — Telephone Encounter (Signed)
Patient aware.

## 2022-10-15 ENCOUNTER — Encounter: Payer: Self-pay | Admitting: Physical Medicine and Rehabilitation

## 2022-10-15 ENCOUNTER — Encounter: Payer: Medicare HMO | Attending: Physical Medicine and Rehabilitation | Admitting: Physical Medicine and Rehabilitation

## 2022-10-15 VITALS — BP 100/65 | HR 83 | Ht 66.0 in | Wt 145.0 lb

## 2022-10-15 DIAGNOSIS — G894 Chronic pain syndrome: Secondary | ICD-10-CM | POA: Insufficient documentation

## 2022-10-15 DIAGNOSIS — Z79899 Other long term (current) drug therapy: Secondary | ICD-10-CM | POA: Insufficient documentation

## 2022-10-15 DIAGNOSIS — Z8639 Personal history of other endocrine, nutritional and metabolic disease: Secondary | ICD-10-CM | POA: Insufficient documentation

## 2022-10-15 DIAGNOSIS — Z5181 Encounter for therapeutic drug level monitoring: Secondary | ICD-10-CM | POA: Diagnosis not present

## 2022-10-15 DIAGNOSIS — S838X2S Sprain of other specified parts of left knee, sequela: Secondary | ICD-10-CM | POA: Diagnosis not present

## 2022-10-15 MED ORDER — OXYCODONE HCL 5 MG PO TABS: 10.0000 mg | ORAL_TABLET | Freq: Two times a day (BID) | ORAL | 0 refills | Status: DC

## 2022-10-15 NOTE — Progress Notes (Signed)
Subjective:    Patient ID: Jane English, female    DOB: 1971-03-13, 51 y.o.   MRN: [id number]  HPI:    Jane English is a 51 y.o. female who returns for f/u appointment for chronic pain in her back secondary to failed back syndrome, bilateral knees secondary to chonrdomalacia of bilateral knees, pain in bilateral hands secondary to osteoarthritis, left sided lower back pain  1) Failed back syndrome -she would like to get her spinal cord stimulator removed, discussed that I have discussed with the [employer] rep -once she gets moving pain is better, but she feels grinding with movement -got her MRI done yesterday -has appointment to remove the stimulator -has been using the TENS machine. Covered by insurance -she has called Dr. Keene Breath office and the records have not been called, I called today to see if this can be expedited.  -needs refill of oxycodone today -pain has been stable -she continues to have severe pain -she has been using the 7.'5mg'$  meloxicam up to three times per day -she says she needs [place] to reach out to her former pain clinic to help get her medical records -Her current exercise regimen is walking and performing stretching exercises. -she is reporting decreased sensation in left lower extremity compared to right -pain is still severe despite starting meloxicam and robaxin -her hardware is MRI compatible.  -she asks when she get her spinal cord stimulator removed. She does not want to go back to her old Psychologist, sport and exercise -Last UDS was Performed on 01/26/2021, it was consistent.  -She has been taking oxycodone PRN (30MME) for years- it is helpful for her pain -she has had negative responses with Gabapentin and Lyrica.  -pain has been really severe recently.  -she has never tried Cymbalta.  -she has been experiencing more left sided low back pain since she has started working at [employer] -no more falls.  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 -she asks if a lady reached out to me about the stimulator machine, she would like to get her spinal cord stimulator removed. She does not want to get this removed where it was originally placed, but would rather get it removed at [place]  2) Chondromalacia of bilateral knees/ left lateral meniscal injury -had knee surgery on the 21st, her son said it lasted 10 minutes -she was sent hydrocodone but has not taken it -has been taking her long standing oxycodone.  -stitches removed -right knee has been feeling good, left knee is still bothering her -she ordered herself an exercise stepper  -she was told not to overdo her exercise  -she is on the phone with her insurance company regarding viscosupplementation coverage- she has had this done several times and it has been helpful.   3) Anxiety -she receives Alprazolam from Dr. Moshe Cipro, PCP- dose was recently reduced in half.  Jane English has discussed the black box warning of using opioids and benzodiazepines and highlighted the dangers of using these drugs together and discussed the adverse events including respiratory suppression, overdose, cognitive impairment and importance of compliance with current regimen.  -she has been referred to therapy by her PCP.   4) Osteoarthritis of bilateral hands  5) Right buttock pain  6) Myofascial pain  7) Chronic constipation: -has followed with GI since last visit  8) Prediabetes -she was advised by GI to discuss Wegovy vs. Ozempic  with her PCP for weight loss- will also help with diabetes.  -there is plan for repeat colostomy- she had a bad experience with this last time.  -taking Monjouro -she lost weight from 190s to 180s with this -she does not eat as much with this -she gets sick when she eats things too sweet  9) Nausea: she does feel some some nausea with her pain medications.   10) Low mood -at times -she is handling at  well.  -She loves to meet new people through her work.  -she has started a new piercing job.   11) Left lumbar radiculopathy: - it is currently really bothering her  -the meloxicam and muscle relaxers help temporarily but not for long -it is severe all the time but she is still working through it -she is unable to have an MRI due to the spinal cord stimulator- she would like to get this removed as soon as possible - she continues on oxycodone -she purchased an over the counter heating oil but has not tried that -she cannot tolerate gabapentin since it has caused her to sleep walk before -she has had a hard time going to work due to the pain -she has tried amitriptyline in the past but was started on '150mg'$  and this made her feel drunk. She has not tried '10mg'$ .  -pain has been very severe -feels shooting pain throughout left leg -notes weakness -working through the pain.  -she is more interested in surgery than injections as she has not had benefit from injections before.   12) Left foot swelling and numbness: -she feels that when she applies heat it improves the numbness  13) Insomnia -she has been sleeping better since starting Amitriptyline   Pain Inventory Average Pain 8 Pain Right Now 8 My pain is constant, sharp, burning, dull, stabbing, tingling, and aching  In the last 24 hours, has pain interfered with the following? General activity 8 Relation with others 8 Enjoyment of life 8 What TIME of day is your pain at its worst? Morning, evening and night Sleep (in general) Fair  Pain is worse with: walking, bending, sitting, inactivity, standing, and some activites Pain improves with: heat/ice and medication Relief from Meds: good  Family History  Problem Relation Age of Onset   Physical abuse Mother    Alcohol abuse Mother    Cirrhosis Mother    Lung cancer Father    Stomach cancer Father    Esophageal cancer Father    Alcohol abuse Father    Mental illness Father     Diabetes Sister    Hypertension Sister    Bipolar disorder Sister    Schizophrenia Sister    Diabetes Sister    Drug abuse Sister    HIV Sister    Pneumonia Sister        died as a baby   Alcohol abuse Brother    Hypertension Brother    Kidney disease Brother    Diabetes Brother    Drug abuse Brother    Mental illness Brother    Alcohol abuse Brother    Alcohol abuse Brother    Hypertension Brother    Diabetes Brother    Alcohol abuse Brother    Mental illness Brother        in [place]   Alcohol abuse Brother    Alcohol abuse Brother    Bipolar disorder Brother    Hypertension Brother    Bipolar disorder Brother    Drug abuse Brother  Alcohol abuse Brother    Bipolar disorder Brother    Bipolar disorder Daughter    Bipolar disorder Son    Bipolar disorder Son    ADD / ADHD Neg Hx    Anxiety disorder Neg Hx    Dementia Neg Hx    Depression Neg Hx    OCD Neg Hx    Seizures Neg Hx    Paranoid behavior Neg Hx    Colon cancer Neg Hx    Rectal cancer Neg Hx    Social History   Socioeconomic History   Marital status: Married    Spouse name: Herbie Baltimore   Number of children: 4   Years of education: 12   Highest education level: Some college, no degree  Occupational History   Occupation: disabled  Tobacco Use   Smoking status: Never   Smokeless tobacco: Never  Vaping Use   Vaping Use: Never used  Substance and Sexual Activity   Alcohol use: No   Drug use: No   Sexual activity: Not Currently    Partners: Male    Birth control/protection: Surgical    Comment: tubal  Other Topics Concern   Not on file  Social History Narrative   Not on file   Social Determinants of Health   Financial Resource Strain: Low Risk  (11/08/2021)   Overall Financial Resource Strain (CARDIA)    Difficulty of Paying Living Expenses: Not hard at all  Food Insecurity: No Food Insecurity (11/08/2021)   Hunger Vital Sign    Worried About Running Out of Food in the Last Year: Never true     Ran Out of Food in the Last Year: Never true  Transportation Needs: No Transportation Needs (11/08/2021)   PRAPARE - Hydrologist (Medical): No    Lack of Transportation (Non-Medical): No  Physical Activity: Insufficiently Active (11/08/2021)   Exercise Vital Sign    Days of Exercise per Week: 3 days    Minutes of Exercise per Session: 30 min  Stress: No Stress Concern Present (11/08/2021)   [place]    Feeling of Stress : Only a little  Social Connections: Moderately Isolated (11/08/2021)   Social Connection and Isolation Panel [NHANES]    Frequency of Communication with Friends and Family: More than three times a week    Frequency of Social Gatherings with Friends and Family: Once a week    Attends Religious Services: Never    Marine scientist or Organizations: No    Attends Music therapist: Never    Marital Status: Married   Past Surgical History:  Procedure Laterality Date   ANTERIOR CERVICAL DECOMP/DISCECTOMY FUSION  07/07/2012   Procedure: ANTERIOR CERVICAL DECOMPRESSION/DISCECTOMY FUSION 2 LEVELS;  Surgeon: Floyce Stakes, MD;  Location: [facility];  Service: Neurosurgery;  Laterality: N/A;  Cervical four-five, five - six  Anterior cervical decompression/diskectomy/fusion/plate   APPENDECTOMY  1986   BOWEL RESECTION N/A 07/29/2013   Procedure: serosal repair;  Surgeon: Adin Hector, MD;  Location: [facility];  Service: General;  Laterality: N/A;   CARPAL TUNNEL RELEASE Bilateral    COLON SURGERY N/A    Phreesia 07/29/2020   LAPAROSCOPY N/A 07/29/2013   Procedure: diagnostic laporoscopy;  Surgeon: Adin Hector, MD;  Location: [facility];  Service: General;  Laterality: N/A;   LAPAROSCOPY N/A 08/16/2013   Procedure: LAPAROSCOPY DIAGNOSTIC/LYSIS OF ADHESIONS;  Surgeon: Adin Hector, MD;  Location: [facility];  Service: General;  Laterality: N/A;   LAPAROTOMY N/A 08/16/2013    Procedure: EXPLORATORY LAPAROTOMY/SMALL BOWEL RESECTION (JEJUNUM);  Surgeon: Adin Hector, MD;  Location: [facility];  Service: General;  Laterality: N/A;   LUMBAR SPINE SURGERY  2010   x 3   LYSIS OF ADHESION  2003   Dr. Irving Shows   LYSIS OF ADHESION N/A 07/29/2013   Procedure: LYSIS OF ADHESION;  Surgeon: Adin Hector, MD;  Location: [facility];  Service: General;  Laterality: N/A;   [place]?   Linna [place], [place]   SPINAL CORD STIMULATOR IMPLANT     SPINE SURGERY N/A    Phreesia 07/29/2020   TRIGGER FINGER RELEASE  2009   right pinkie finger   TUBAL LIGATION  1994   Past Surgical History:  Procedure Laterality Date   ANTERIOR CERVICAL DECOMP/DISCECTOMY FUSION  07/07/2012   Procedure: ANTERIOR CERVICAL DECOMPRESSION/DISCECTOMY FUSION 2 LEVELS;  Surgeon: Floyce Stakes, MD;  Location: [facility];  Service: Neurosurgery;  Laterality: N/A;  Cervical four-five, five - six  Anterior cervical decompression/diskectomy/fusion/plate   APPENDECTOMY  1986   BOWEL RESECTION N/A 07/29/2013   Procedure: serosal repair;  Surgeon: Adin Hector, MD;  [facility]: [facility];  Service: General;  Laterality: N/A;   CARPAL TUNNEL RELEASE Bilateral    COLON SURGERY N/A    Phreesia 07/29/2020   LAPAROSCOPY N/A 07/29/2013   Procedure: diagnostic laporoscopy;  Surgeon: Adin Hector, MD;  [facility];  Service: General;  Laterality: N/A;   LAPAROSCOPY N/A 08/16/2013   Procedure: LAPAROSCOPY DIAGNOSTIC/LYSIS OF ADHESIONS;  Surgeon: Adin Hector, MD;  [facility];  Service: General;  Laterality: N/A;   LAPAROTOMY N/A 08/16/2013   Procedure: EXPLORATORY LAPAROTOMY/SMALL BOWEL RESECTION (JEJUNUM);  Surgeon: Adin Hector, MD;  [facility];  Service: General;  Laterality: N/A;   LUMBAR SPINE SURGERY  2010   x 3   LYSIS OF ADHESION  2003   Dr. Irving Shows   LYSIS OF ADHESION N/A 07/29/2013   Procedure: LYSIS OF ADHESION;  Surgeon: Adin Hector, MD;   [facility];  Service: General;  Laterality: N/A;   [place]?   [place], [place]   SPINAL CORD STIMULATOR IMPLANT     SPINE SURGERY N/A    Phreesia 07/29/2020   TRIGGER FINGER RELEASE  2009   right pinkie finger   TUBAL LIGATION  1994   Past Medical History:  Diagnosis Date   Anemia    Asthma    Asthma flare 04/09/2013   Back pain    Bronchitis    Chronic abdominal pain    Chronic constipation    Constipation due to opioid therapy    Depression    Depression, major, single episode, severe ([place]) 10/03/2018   PHQ 9 score of 15   Diabetes mellitus without complication ([place])    Diabetes mellitus, type II ([place])    DVT (deep venous thrombosis) ([place]) 2010   GERD (gastroesophageal reflux disease)    Heart murmur    no cardiologist   Helicobacter pylori gastritis 06/11/2013   Colonoscopy Dr. Hilarie Fredrickson   Hypertension    IBS (irritable bowel syndrome)    Migraine headache    Neuropathy    Obesity    Obsessive-compulsive disorder    PSYCHOTIC D/O W/HALLUCINATIONS CONDS CLASS ELSW 03/04/2010   Qualifier: Diagnosis of  By: Moshe Cipro MD, Joycelyn Schmid     PTSD (  post-traumatic stress disorder)    SBO (small bowel obstruction) ([place]) 08/09/2013   Seasonal allergies 12/10/2012   Seizures ([place])    Shortness of breath    There were no vitals taken for this visit.  Opioid Risk Score:   Fall Risk Score:  `1  Depression screen [facility] 2/9     09/20/2022    9:05 AM 09/06/2022    9:45 AM 08/06/2022   10:01 AM 06/20/2022    9:05 AM 05/30/2022   11:19 AM 03/21/2022   11:02 AM 03/12/2022   11:25 AM  Depression screen PHQ 2/9  Decreased Interest 0 0 0 0 0 0 0  Down, Depressed, Hopeless 0 0 0 0 0 0 0  PHQ - 2 Score 0 0 0 0 0 0 0    Review of Systems  Constitutional: Negative.   HENT: Negative.    Eyes: Negative.   Respiratory: Negative.    Cardiovascular: Negative.   Gastrointestinal: Negative.   Endocrine: Negative.   Genitourinary: Negative.   Musculoskeletal:   Positive for back pain.       Pain in both knee & both shoulders  Skin: Negative.   Allergic/Immunologic: Negative.   Neurological: Negative.   Hematological: Negative.   All other systems reviewed and are negative.      Objective:  Gen: no distress, normal appearing, weight 145 lbs, BMI 23.40 HEENT: oral mucosa pink and moist, NCAT Cardio: Reg rate Chest: normal effort, normal rate of breathing Abd: soft, non-distended Ext: no edema Psych: pleasant, normal affect Skin: intact MSK: ambulating without AD    Assessment & Plan:  1. Chronic Pain of Bilateral Shoulders L>R: Continue HEP as Tolerated. Continue to Monitor.  2. Failed Back Syndrome of Lumbar Syndrome : Continue HEP as Tolerated. Continue current medication regimen. Continue to Monitor. Asked for phone number of [employer] rep and I will call her today to discuss removal of her spinal cord stimulator which is no longer functional.  -continue applying deep blue -called Manuela Schwartz [employer] rep to let her know patient would like spinal cord stimulator removed and to ask how I can help with this. She recommended referral to Dr. Davy Pique at [facility]. Discussed that I have already referred patient there and she has not heard from them. Placed new referral today and Manuela Schwartz will reach out to Dr. Davy Pique as well.  -Appreciate Manuela Schwartz communicating with [facility] that her current spinal cord stimulator is MRI compatible. -refilled oxycodone BID PRN Prescribed robaxin and meloxicam. Discussed risks and benefits.  -Discussed current symptoms of pain and history of pain.  -Refilled oxycodone -Discussed following foods that may reduce pain: 1) Ginger (especially studied for arthritis)- reduce leukotriene production to decrease inflammation 2) Blueberries- high in phytonutrients that decrease inflammation 3) Salmon- marine omega-3s reduce joint swelling and pain 4) Pumpkin seeds- reduce inflammation 5) dark chocolate-  reduces inflammation 6) turmeric- reduces inflammation 7) tart cherries - reduce pain and stiffness 8) extra virgin olive oil - its compound olecanthal helps to block prostaglandins  9) chili peppers- can be eaten or applied topically via capsaicin 10) mint- helpful for headache, muscle aches, joint pain, and itching 11) garlic- reduces inflammation  Link to further information on diet for chronic pain: [email or web address]  -Provided with a pain relief journal and discussed that it contains foods and lifestyle tips to naturally help to improve pain. Discussed that these lifestyle strategies are also very good for health unlike some medications which can have negative side effects. Discussed that  the act of keeping a journal can be therapeutic and helpful to realize patterns what helps to trigger and alleviate pain.  l 3. Chronic Lower Back Pain without Sciatica: Continue HEP as Tolerated. Continue to Monitor. Referred to [facility] therapy -texted her exercises she can do to improve spinal alignment and improve pain -discussed gabapentin but she cannot use this due to history of sleepwalking on this medicine -recommended trying doTerra Deep Blue essential oil -discussed that it is ok for use to provide an early refill of oxycodone this times if she needs due to her increased pain 4. Left Knee Pain: No Complaints today. Continue HEP as Tolerated. Continue to Monitor. Discussed Wobenzymes.  5. Muscle Spasm of Shoulders/ Upper Back : Continue Flexeril. Continue to Monitor. 6. Chronic Pain Syndrome: Refilled: Oxycodone '5mg'$ /325 mg two tablets twice a day #120. We will continue the opioid monitoring program, this consists of regular clinic visits, examinations, urine drug screen, pill counts as well as use of [place] Controlled Substance Reporting system.   7. Constipation:  -continue Linzess as prescribed by GI -Provided list of  following foods that help with constipation and highlighted a few: 1) prunes- contain high amounts of fiber.  2) apples- has a form of dietary fiber called pectin that accelerates stool movement and increases beneficial gut bacteria 3) pears- in addition to fiber, also high in fructose and sorbitol which have laxative effect 4) figs- contain an enzyme ficin which helps to speed colonic transit 5) kiwis- contain an enzyme actinidin that improves gut motility and reduces constipation 6) oranges- rich in pectin (like apples) 7) grapefruits- contain a flavanol naringenin which has a laxative effect 8) vegetables- rich in fiber and also great sources of folate, vitamin C, and K 9) artichoke- high in inulin, prebiotic great for the microbiome 10) chicory- increases stool frequency and softness (can be added to coffee) 11) rhubarb- laxative effect 12) sweet potato- high fiber 13) beans, peas, and lentils- contain both soluble and insoluble fiber 14) chia seeds- improves intestinal health and gut flora 15) flaxseeds- laxative effect 16) whole grain rye bread- high in fiber 17) oat bran- high in soluble and insoluble fiber 18) kefir- softens stools -recommended to try at least one of these foods every day.  -drink 6-8 glasses of water per day -walk regularly, especially after meals.   8) Anxiety: -Continue alprazolam as prescribed by Dr. Moshe Cipro. Appreciate Eunice's counseling regarding increased risk of side effects with use of Alprazolam and Oxycodone.  -Discussed exercise and meditation as tools to decrease anxiety. -Recommended Down Dog Yoga app -Discussed spending time outdoors. -Discussed positive re-framing of anxiety.  -Discussed the following foods that have been show to reduce anxiety: 1) [place] nuts, mushrooms, soy beans due to their high selenium content. Upper limit of toxicity of selenium is 420mg/day so no more than 3-4 bBolivianuts per day.  2) Fatty fish such as salmon,  mackerel, sardines, trout, and herring- high in omega-3 fatty acids 3) Eggs- increases serotonin and dopamine 4) Pumpkin seeds- high in omega-3 fatty acids 5) dark chocolate- high in flavanols that increase blood flow to brain 6) turmeric- take with black pepper to increase absorption 7) chamomile tea- antioxidant and anti-inflammatory properties 8) yogurt without sugar- supports gut-brain axis 9) green tea- contains L- theanine 10) blueberries- high in vitamin C and antioxidants 11) t[place] high in tryptophan which gets converted to serotonin 12) bell peppers- rich in vitamin C and antioxidants 13) citrus fruits- rich in vitamin C and antioxidants  14) almonds- high in vitamin E and healthy fats 15) chia seeds- high in omega-3 fatty acids  9) Diabetes type 2 -HgbA1c reviewed- 6.7 -continue Mounjaro, discussed that nausea is a common side effect and usually shortlived.  -check CBGs daily, log, and bring log to follow-up appointment -avoid sugar, bread, pasta, rice -avoid snacking -try to incorporate into your diet some of the following foods which are good for diabetes: 1) cinnamon- imitates effects of insulin, increasing glucose transport into cells ([place] or [place] cinnamon is best, least processed) 2) nuts- can slow down the blood sugar response of carbohydrate rich foods 3) oatmeal- contains and anti-inflammatory compound avenanthramide 4) whole-milk yogurt (best types are no sugar, [place] yogurt, or goat/sheep yogurt) 5) beans- high in protein, fiber, and vitamins, low glycemic index 6) broccoli- great source of vitamin A and C 7) quinoa- higher in protein and fiber than other grains 8) spinach- high in vitamin A, fiber, and protein 9) olive oil- reduces glucose levels, LDL, and triglycerides 10) salmon- excellent amount of omega-3-fatty acids 11) walnuts- rich in antioxidants 12) apples- high in fiber and quercetin 13) carrots- highly nutritious with low impact on blood  sugar 14) eggs- improve HDL (good cholesterol), high in protein, keep you satiated 15) turmeric: improves blood sugars, cardiovascular disease, and protects kidney health 16) garlic: improves blood sugar, blood pressure, pain 17) tomatoes: highly nutritious with low impact on blood sugar   10) Obesity: -Educated regarding health benefits of weight loss- for pain, general health, chronic disease prevention, immune health, mental health.  Reviewed current weight is 145 lbs, BMI 23,40, commended on 18 lb weight loss! -prescribed magnesium gluconate '250mg'$  HS -Will monitor weight every visit.  -Consider [place] tea daily.  -Discussed the benefits of intermittent fasting. -Discussed foods that can assist in weight loss: 1) leafy greens- high in fiber and nutrients 2) dark chocolate- improves metabolism (if prefer sweetened, best to sweeten with honey instead of sugar).  3) cruciferous vegetables- high in fiber and protein 4) full fat yogurt: high in healthy fat, protein, calcium, and probiotics 5) apples- high in a variety of phytochemicals 6) nuts- high in fiber and protein that increase feelings of fullness 7) grapefruit: rich in nutrients, antioxidants, and fiber (not to be taken with anticoagulation) 8) beans- high in protein and fiber 9) salmon- has high quality protein and healthy fats 10) green tea- rich in polyphenols 11) eggs- rich in choline and vitamin D 12) tuna- high protein, boosts metabolism 13) avocado- decreases visceral abdominal fat 14) chicken (pasture raised): high in protein and iron 15) blueberries- reduce abdominal fat and cholesterol 16) whole grains- decreases calories retained during digestion, speeds metabolism 17) chia seeds- English appetite 18) chilies- increases fat metabolism  -Discussed supplements that can be used:  1) Metatrim '400mg'$  BID 30 minutes before breakfast and dinner  2) Sphaeranthus indicus and Garcinia mangostana (combinations of these and #1 can  be found in capsicum and zychrome  3) green coffee bean extract '400mg'$  twice per day or Irvingia (african mango) 150 to '300mg'$  twice per day.  11) Left lumbar radiculopathy: -NexWave Zynex IFS ordered -Provided with a pain relief journal and discussed that it contains foods and lifestyle tips to naturally help to improve pain. Discussed that these lifestyle strategies are also very good for health unlike some medications which can have negative side effects. Discussed that the act of keeping a journal can be therapeutic and helpful to realize patterns what helps to trigger and alleviate pain.   -  MRI ordered of lumbar spine, discussed suspected diagnosis of lumbar radiculopathy given lower back pain radiating into lower extremity. Unfortunately, looks like she will be unable to get MRI due to her spinal cord stimulator. Referred to [place] for removal of spinal cord stimulator. CT lumbar spine ordered [facility] given new neurological deficits of decreased sensation. Discussed going to ED if neurological symptoms worsen -continue amitriptyline '10mg'$  HS, discussed that this could potentially help her sleep better at night without having to take a percocet at this time -discontinued meloxicam since not helping much and has several potential side effects. -reviewed the results of her CT lumbar spine.  Discussed that there are no significant concerning findings.  -discussed I will call her today once Mri results are available.  -f/u with Dr. Gean Quint about removal of the spinal cord stimulator, discussed with [employer] rep trying to expedite this removal as this is very important to her -refilled her oxycodone -called Dr. Keene Breath office at [facility] to see if we can get records sent to Dr. Tretha Sciara office -Hopefully getting MRI will be helpful to Dr. Gean Quint for spinal cord stimulator removal well.   12) Nausea Resolved  13) Right knee OA -called insurance company to discuss  why viscosupplementation was denied. They said no need for auth for Synvisc One Reference ID# [id number]  Right knee injection  Indication: Right knee pain not relieved by medication management and other conservative care.  Informed consent was obtained after describing risks and benefits of the procedure with the patient, this includes bleeding, bruising, infection and medication side effects. The patient wishes to proceed and has given written consent. The patient was placed in a recumbent position. The medial aspect of the knee was marked and prepped with Betadine and alcohol. It was then entered with a 25-gauge 1-1/2 inch needle and Synvisc One was injected into the joint after negative draw back for blood. The patient tolerated the procedure well. Post procedure instructions were given.    14) Insomnia: -Try to go outside near sunrise -Get exercise during the day.  -Turn off all devices an hour before bedtime.  -Teas that can benefit: chamomile, valerian root, Brahmi (Bacopa) -Can consider over the counter melatonin, magnesium, and/or L-theanine. Melatonin is an anti-oxidant with multiple health benefits. Magnesium is involved in greater than 300 enzymatic reactions in the body and most of [place] are deficient as our soil is often depleted. There are 7 different types of magnesium- Bioptemizer's is a supplement with all 7 types, and each has unique benefits. Magnesium can also help with constipation and anxiety.  -Pistachios naturally increase the production of melatonin -Cozy Earth bamboo bed sheets are free from toxic chemicals.  -Tart cherry juice or a tart cherry supplement can improve sleep and soreness post-workout  -continue amitriptyline.

## 2022-10-15 NOTE — Patient Instructions (Signed)
Foods that may reduce pain: 1) Ginger (especially studied for arthritis)- reduce leukotriene production to decrease inflammation 2) Blueberries- high in phytonutrients that decrease inflammation 3) Salmon- marine omega-3s reduce joint swelling and pain 4) Pumpkin seeds- reduce inflammation 5) dark chocolate- reduces inflammation 6) turmeric- reduces inflammation 7) tart cherries - reduce pain and stiffness 8) extra virgin olive oil - its compound olecanthal helps to block prostaglandins  9) chili peppers- can be eaten or applied topically via capsaicin 10) mint- helpful for headache, muscle aches, joint pain, and itching 11) garlic- reduces inflammation  Link to further information on diet for chronic pain: [email or web address]  

## 2022-10-18 LAB — TOXASSURE SELECT,+ANTIDEPR,UR

## 2022-10-19 ENCOUNTER — Other Ambulatory Visit: Payer: Self-pay | Admitting: Family Medicine

## 2022-10-19 DIAGNOSIS — M961 Postlaminectomy syndrome, not elsewhere classified: Secondary | ICD-10-CM | POA: Diagnosis not present

## 2022-10-23 ENCOUNTER — Telehealth: Payer: Self-pay | Admitting: Family Medicine

## 2022-10-24 ENCOUNTER — Telehealth: Payer: Self-pay | Admitting: *Deleted

## 2022-10-24 MED ORDER — OXYCODONE HCL 5 MG PO TABS: 10.0000 mg | ORAL_TABLET | Freq: Two times a day (BID) | ORAL | 0 refills | Status: DC

## 2022-10-24 NOTE — Telephone Encounter (Signed)
Mrs Colebank was cleaning out old medication and she must have thrown her oxycodone away. She has searched everywhere and she cannot find it. It was just filled on 10/07/22. She has not had an incident like this before. I informed her that if the meds are refilled, there are no guarantees she can get from pharmacy and she will have to pay out of pocket for the RX. She has had the Rx 17 days (used 68 pills) so she would need #52 tablets to get through the 30 day RX.

## 2022-10-24 NOTE — Telephone Encounter (Signed)
Urine drug screen for this encounter is consistent for prescribed medication 

## 2022-10-29 ENCOUNTER — Telehealth: Payer: Self-pay | Admitting: Family Medicine

## 2022-10-29 DIAGNOSIS — M961 Postlaminectomy syndrome, not elsewhere classified: Secondary | ICD-10-CM | POA: Diagnosis not present

## 2022-10-29 NOTE — Telephone Encounter (Signed)
PATIENT WANTS A CALL BACK FROM [facility] IN REGARD TO [facility] MESSAGES SENT

## 2022-10-30 MED ORDER — PROMETHAZINE-DM 6.25-15 MG/5ML PO SYRP: ORAL_SOLUTION | ORAL | 0 refills | Status: DC

## 2022-10-30 MED ORDER — PREDNISONE 5 MG PO TABS: 5.0000 mg | ORAL_TABLET | Freq: Two times a day (BID) | ORAL | 0 refills | Status: AC

## 2022-10-30 MED ORDER — BENZONATATE 100 MG PO CAPS: 100.0000 mg | ORAL_CAPSULE | Freq: Two times a day (BID) | ORAL | 0 refills | Status: DC | PRN

## 2022-10-30 MED ORDER — AZITHROMYCIN 250 MG PO TABS: ORAL_TABLET | ORAL | 0 refills | Status: AC

## 2022-10-30 NOTE — Telephone Encounter (Signed)
See other mychart msg sent to Dr Moshe Cipro.

## 2022-11-05 ENCOUNTER — Telehealth: Payer: Self-pay | Admitting: Family Medicine

## 2022-11-05 NOTE — Telephone Encounter (Signed)
FYI  Pt called stating that she tested negative for covid.

## 2022-11-07 ENCOUNTER — Encounter: Payer: Medicare HMO | Attending: Physical Medicine and Rehabilitation | Admitting: Physical Medicine and Rehabilitation

## 2022-11-07 ENCOUNTER — Encounter: Payer: Self-pay | Admitting: Physical Medicine and Rehabilitation

## 2022-11-07 VITALS — BP 110/75 | HR 89 | Ht 66.0 in | Wt 162.0 lb

## 2022-11-07 DIAGNOSIS — G4701 Insomnia due to medical condition: Secondary | ICD-10-CM

## 2022-11-07 DIAGNOSIS — Z8616 Personal history of COVID-19: Secondary | ICD-10-CM | POA: Diagnosis not present

## 2022-11-07 DIAGNOSIS — M961 Postlaminectomy syndrome, not elsewhere classified: Secondary | ICD-10-CM | POA: Diagnosis not present

## 2022-11-07 MED ORDER — OXYCODONE HCL 5 MG PO TABS: 10.0000 mg | ORAL_TABLET | Freq: Two times a day (BID) | ORAL | 0 refills | Status: DC

## 2022-11-07 MED ORDER — MAGNESIUM OXIDE 400 MG PO TABS: 400.0000 mg | ORAL_TABLET | Freq: Two times a day (BID) | ORAL | 3 refills | Status: AC

## 2022-11-07 NOTE — Patient Instructions (Signed)
Insomnia: -Try to go outside near sunrise -Get exercise during the day.  -Turn off all devices an hour before bedtime.  -Teas that can benefit: chamomile, valerian root, Brahmi (Bacopa) -Can consider over the counter melatonin, magnesium, and/or L-theanine. Melatonin is an anti-oxidant with multiple health benefits. Magnesium is involved in greater than 300 enzymatic reactions in the body and most of us are deficient as our soil is often depleted. There are 7 different types of magnesium- Bioptemizer's is a supplement with all 7 types, and each has unique benefits. Magnesium can also help with constipation and anxiety.  -Pistachios naturally increase the production of melatonin -Cozy Earth bamboo bed sheets are free from toxic chemicals.  -Tart cherry juice or a tart cherry supplement can improve sleep and soreness post-workout   

## 2022-11-07 NOTE — Addendum Note (Signed)
Addended by: Izora Ribas on: 11/07/2022 10:27 AM   Modules accepted: Orders

## 2022-11-07 NOTE — Progress Notes (Signed)
Subjective:    Patient ID: Jane English, female    DOB: 1971/08/27, 52 y.o.   MRN: [id number]  HPI:    Jane English is a 52 y.o. female who returns for f/u appointment for chronic pain in her back secondary to failed back syndrome, bilateral knees secondary to chonrdomalacia of bilateral knees, pain in bilateral hands secondary to osteoarthritis, left sided lower back pain  1) Failed back syndrome -lost her medicine since last visit, this is the first time she ever did this and we provided an early refill -she told me how she lost the medication -once she got to [facility] for her son's house she was feeling very drained from the stress of losing her medicine and searching for it, plus her concomitant allergies and COVID injection -she would like to get her spinal cord stimulator removed, discussed that I have discussed with the [employer] rep -once she gets moving pain is better, but she feels grinding with movement -got her MRI done yesterday -has appointment to remove the stimulator -has been using the TENS machine. Covered by insurance -she has called Dr. Keene Breath office and the records have not been called, I called today to see if this can be expedited.  -needs refill of oxycodone today -pain has been stable -she continues to have severe pain -she has been using the 7.'5mg'$  meloxicam up to three times per day -she says she needs [place] to reach out to her former pain clinic to help get her medical records -Her current exercise regimen is walking and performing stretching exercises. -she is reporting decreased sensation in left lower extremity compared to right -pain is still severe despite starting meloxicam and robaxin -her hardware is MRI compatible.  -she asks when she get her spinal cord stimulator removed. She does not want to go back to her old Psychologist, sport and exercise -Last UDS was Performed on 01/26/2021, it was consistent.  -She has been taking oxycodone PRN (30MME) for years- it is helpful for her  pain -she has had negative responses with Gabapentin and Lyrica.  -pain has been really severe recently.  -she has never tried Cymbalta.  -she has been experiencing more left sided low back pain since she has started working at [employer] -no more falls.  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 -she asks if a lady reached out to me about the stimulator machine, she would like to get her spinal cord stimulator removed. She does not want to get this removed where it was originally placed, but would rather get it removed at [place]  2) Chondromalacia of bilateral knees/ left lateral meniscal injury -had knee surgery on the 21st, her son said it lasted 10 minutes -she was sent hydrocodone but has not taken it -has been taking her long standing oxycodone.  -stitches removed -right knee has been feeling good, left knee is still bothering her -she ordered herself an exercise stepper  -she was told not to overdo her exercise  -she is on the phone with her insurance company regarding viscosupplementation coverage- she has had this done several times and it has been helpful.   3) Anxiety -she receives Alprazolam from Dr. Moshe Cipro, PCP- dose was recently reduced in half.  [name] has discussed the black box warning of using opioids and benzodiazepines and highlighted the dangers of using these drugs together and discussed the adverse events including respiratory  suppression, overdose, cognitive impairment and importance of compliance with current regimen.  -she has been referred to therapy by her PCP.   4) Osteoarthritis of bilateral hands  5) Right buttock pain  6) Myofascial pain  7) Chronic constipation: -has followed with GI since last visit  8) Prediabetes -she was advised by GI to discuss Wegovy vs. Ozempic with her PCP for weight loss- will also help with diabetes.  -there is plan for  repeat colostomy- she had a bad experience with this last time.  -taking Monjouro -she lost weight from 190s to 180s with this -she does not eat as much with this -she gets sick when she eats things too sweet  9) Nausea: she does feel some some nausea with her pain medications.   10) Low mood -at times -she is handling at well.  -She loves to meet new people through her work.  -she has started a new piercing job.   11) Left lumbar radiculopathy: - it is currently really bothering her  -the meloxicam and muscle relaxers help temporarily but not for long -it is severe all the time but she is still working through it -she is unable to have an MRI due to the spinal cord stimulator- she would like to get this removed as soon as possible - she continues on oxycodone -she purchased an over the counter heating oil but has not tried that -she cannot tolerate gabapentin since it has caused her to sleep walk before -she has had a hard time going to work due to the pain -she has tried amitriptyline in the past but was started on '150mg'$  and this made her feel drunk. She has not tried '10mg'$ .  -pain has been very severe -feels shooting pain throughout left leg -notes weakness -working through the pain.  -she is more interested in surgery than injections as she has not had benefit from injections before.   12) Left foot swelling and numbness: -she feels that when she applies heat it improves the numbness  13) Insomnia -she has been sleeping better since starting Amitriptyline  14) Recent COVID infection: -feels well today -had a cough and could hardly breath when she recently visited her son   Pain Inventory Average Pain 8 Pain Right Now 8 My pain is constant, sharp, burning, dull, stabbing, tingling, and aching  In the last 24 hours, has pain interfered with the following? General activity 9 Relation with others 8 Enjoyment of life 9 What TIME of day is your pain at its worst?  Morning, evening and night Sleep (in general) Good  Pain is worse with: walking, bending, sitting, inactivity, standing, and some activites Pain improves with: rest, heat/ice, and medication Relief from Meds: good  Family History  Problem Relation Age of Onset   Physical abuse Mother    Alcohol abuse Mother    Cirrhosis Mother    Lung cancer Father    Stomach cancer Father    Esophageal cancer Father    Alcohol abuse Father    Mental illness Father    Diabetes Sister    Hypertension Sister    Bipolar disorder Sister    Schizophrenia Sister    Diabetes Sister    Drug abuse Sister    HIV Sister    Pneumonia Sister        died as a baby   Alcohol abuse Brother    Hypertension Brother    Kidney disease Brother    Diabetes Brother    Drug abuse Brother  Mental illness Brother    Alcohol abuse Brother    Alcohol abuse Brother    Hypertension Brother    Diabetes Brother    Alcohol abuse Brother    Mental illness Brother        in [place]   Alcohol abuse Brother    Alcohol abuse Brother    Bipolar disorder Brother    Hypertension Brother    Bipolar disorder Brother    Drug abuse Brother    Alcohol abuse Brother    Bipolar disorder Brother    Bipolar disorder Daughter    Bipolar disorder Son    Bipolar disorder Son    ADD / ADHD Neg Hx    Anxiety disorder Neg Hx    Dementia Neg Hx    Depression Neg Hx    OCD Neg Hx    Seizures Neg Hx    Paranoid behavior Neg Hx    Colon cancer Neg Hx    Rectal cancer Neg Hx    Social History   Socioeconomic History   Marital status: Married    Spouse name: Herbie Baltimore   Number of children: 4   Years of education: 12   Highest education level: Some college, no degree  Occupational History   Occupation: disabled  Tobacco Use   Smoking status: Never   Smokeless tobacco: Never  Vaping Use   Vaping Use: Never used  Substance and Sexual Activity   Alcohol use: No   Drug use: No   Sexual activity: Not Currently    Partners:  Male    Birth control/protection: Surgical    Comment: tubal  Other Topics Concern   Not on file  Social History Narrative   Not on file   Social Determinants of Health   Financial Resource Strain: Low Risk  (11/08/2021)   Overall Financial Resource Strain (CARDIA)    Difficulty of Paying Living Expenses: Not hard at all  Food Insecurity: No Food Insecurity (11/08/2021)   Hunger Vital Sign    Worried About Running Out of Food in the Last Year: Never true    Ran Out of Food in the Last Year: Never true  Transportation Needs: No Transportation Needs (11/08/2021)   PRAPARE - Hydrologist (Medical): No    Lack of Transportation (Non-Medical): No  Physical Activity: Insufficiently Active (11/08/2021)   Exercise Vital Sign    Days of Exercise per Week: 3 days    Minutes of Exercise per Session: 30 min  Stress: No Stress Concern Present (11/08/2021)   [place]    Feeling of Stress : Only a little  Social Connections: Moderately Isolated (11/08/2021)   Social Connection and Isolation Panel [NHANES]    Frequency of Communication with Friends and Family: More than three times a week    Frequency of Social Gatherings with Friends and Family: Once a week    Attends Religious Services: Never    Marine scientist or Organizations: No    Attends Music therapist: Never    Marital Status: Married   Past Surgical History:  Procedure Laterality Date   ANTERIOR CERVICAL DECOMP/DISCECTOMY FUSION  07/07/2012   Procedure: ANTERIOR CERVICAL DECOMPRESSION/DISCECTOMY FUSION 2 LEVELS;  Surgeon: Floyce Stakes, MD;  Location: [facility];  Service: Neurosurgery;  Laterality: N/A;  Cervical four-five, five - six  Anterior cervical decompression/diskectomy/fusion/plate   APPENDECTOMY  1986   BOWEL RESECTION N/A 07/29/2013   Procedure:  serosal repair;  Surgeon: Adin Hector, MD;  Location: [facility];   Service: General;  Laterality: N/A;   CARPAL TUNNEL RELEASE Bilateral    COLON SURGERY N/A    Phreesia 07/29/2020   LAPAROSCOPY N/A 07/29/2013   Procedure: diagnostic laporoscopy;  Surgeon: Adin Hector, MD;  Location: [facility];  Service: General;  Laterality: N/A;   LAPAROSCOPY N/A 08/16/2013   Procedure: LAPAROSCOPY DIAGNOSTIC/LYSIS OF ADHESIONS;  Surgeon: Adin Hector, MD;  Location: [facility];  Service: General;  Laterality: N/A;   LAPAROTOMY N/A 08/16/2013   Procedure: EXPLORATORY LAPAROTOMY/SMALL BOWEL RESECTION (JEJUNUM);  Surgeon: Adin Hector, MD;  Location: [facility];  Service: General;  Laterality: N/A;   LUMBAR SPINE SURGERY  2010   x 3   LYSIS OF ADHESION  2003   Dr. Irving Shows   LYSIS OF ADHESION N/A 07/29/2013   Procedure: LYSIS OF ADHESION;  Surgeon: Adin Hector, MD;  Location: [facility];  Service: General;  Laterality: N/A;   [place]?   Linna Hoff, [place]   SPINAL CORD STIMULATOR IMPLANT     SPINE SURGERY N/A    Phreesia 07/29/2020   TRIGGER FINGER RELEASE  2009   right pinkie finger   TUBAL LIGATION  1994   Past Surgical History:  Procedure Laterality Date   ANTERIOR CERVICAL DECOMP/DISCECTOMY FUSION  07/07/2012   Procedure: ANTERIOR CERVICAL DECOMPRESSION/DISCECTOMY FUSION 2 LEVELS;  Surgeon: Floyce Stakes, MD;  Location: [facility];  Service: Neurosurgery;  Laterality: N/A;  Cervical four-five, five - six  Anterior cervical decompression/diskectomy/fusion/plate   APPENDECTOMY  1986   BOWEL RESECTION N/A 07/29/2013   Procedure: serosal repair;  Surgeon: Adin Hector, MD;  Location: [facility];  Service: General;  Laterality: N/A;   CARPAL TUNNEL RELEASE Bilateral    COLON SURGERY N/A    Phreesia 07/29/2020   LAPAROSCOPY N/A 07/29/2013   Procedure: diagnostic laporoscopy;  Surgeon: Adin Hector, MD;  [facility]: [facility];  Service: General;  Laterality: N/A;   LAPAROSCOPY N/A 08/16/2013   Procedure: LAPAROSCOPY DIAGNOSTIC/LYSIS  OF ADHESIONS;  Surgeon: Adin Hector, MD;  [facility];  Service: General;  Laterality: N/A;   LAPAROTOMY N/A 08/16/2013   Procedure: EXPLORATORY LAPAROTOMY/SMALL BOWEL RESECTION (JEJUNUM);  Surgeon: Adin Hector, MD;  [facility];  Service: General;  Laterality: N/A;   LUMBAR SPINE SURGERY  2010   x 3   LYSIS OF ADHESION  2003   Dr. Irving Shows   LYSIS OF ADHESION N/A 07/29/2013   Procedure: LYSIS OF ADHESION;  Surgeon: Adin Hector, MD;  [facility];  Service: General;  Laterality: N/A;   [place]?   Linna Hoff, [place]   SPINAL CORD STIMULATOR IMPLANT     SPINE SURGERY N/A    Phreesia 07/29/2020   TRIGGER FINGER RELEASE  2009   right pinkie finger   TUBAL LIGATION  1994   Past Medical History:  Diagnosis Date   Anemia    Asthma    Asthma flare 04/09/2013   Back pain    Bronchitis    Chronic abdominal pain    Chronic constipation    Constipation due to opioid therapy    Depression    Depression, major, single episode, severe ([place]) 10/03/2018   PHQ 9 score of 15   Diabetes mellitus without complication ([place])    Diabetes mellitus, type II ([place])    DVT (deep  venous thrombosis) ([place]) 2010   GERD (gastroesophageal reflux disease)    Heart murmur    no cardiologist   Helicobacter pylori gastritis 06/11/2013   Colonoscopy Dr. Hilarie Fredrickson   Hypertension    IBS (irritable bowel syndrome)    Migraine headache    Neuropathy    Obesity    Obsessive-compulsive disorder    PSYCHOTIC D/O W/HALLUCINATIONS CONDS CLASS ELSW 03/04/2010   Qualifier: Diagnosis of  By: Moshe Cipro MD, Margaret     PTSD (post-traumatic stress disorder)    SBO (small bowel obstruction) ([place]) 08/09/2013   Seasonal allergies 12/10/2012   Seizures ([place])    Shortness of breath    There were no vitals taken for this visit.  Opioid Risk Score:   Fall Risk Score:  `1  Depression screen [facility] 2/9     10/15/2022   10:05 AM 09/20/2022    9:05 AM 09/06/2022    9:45 AM  08/06/2022   10:01 AM 06/20/2022    9:05 AM 05/30/2022   11:19 AM 03/21/2022   11:02 AM  Depression screen PHQ 2/9  Decreased Interest 0 0 0 0 0 0 0  Down, Depressed, Hopeless 0 0 0 0 0 0 0  PHQ - 2 Score 0 0 0 0 0 0 0    Review of Systems  Constitutional: Negative.   HENT: Negative.    Eyes: Negative.   Respiratory: Negative.    Cardiovascular: Negative.   Gastrointestinal: Negative.   Endocrine: Negative.   Genitourinary: Negative.   Musculoskeletal:  Positive for back pain.       Pain in both knee & both shoulders  Skin: Negative.   Allergic/Immunologic: Negative.   Neurological: Negative.   Hematological: Negative.   All other systems reviewed and are negative.      Objective:  Gen: no distress, normal appearing, weight 145 lbs, BMI 23.40 HEENT: oral mucosa pink and moist, NCAT Cardio: Reg rate Chest: normal effort, normal rate of breathing Abd: soft, non-distended Ext: no edema Psych: pleasant, normal affect Skin: intact MSK: ambulating without AD    Assessment & Plan:  1. Chronic Pain of Bilateral Shoulders L>R: Continue HEP as Tolerated. Continue to Monitor.  2. Failed Back Syndrome of Lumbar Syndrome : Continue HEP as Tolerated. Continue current medication regimen. Continue to Monitor. Asked for phone number of [employer] rep and I will call her today to discuss removal of her spinal cord stimulator which is no longer functional.  -continue applying deep blue -called Manuela Schwartz [employer] rep to let her know patient would like spinal cord stimulator removed and to ask how I can help with this. She recommended referral to Dr. Davy Pique at [facility]. Discussed that I have already referred patient there and she has not heard from them. Placed new referral today and Manuela Schwartz will reach out to Dr. Davy Pique as well.  -Appreciate Manuela Schwartz communicating with [facility] that her current spinal cord stimulator is MRI compatible. -refilled oxycodone BID PRN Prescribed  robaxin and meloxicam. Discussed risks and benefits.  -Discussed current symptoms of pain and history of pain.  -Refilled oxycodone -Discussed following foods that may reduce pain: 1) Ginger (especially studied for arthritis)- reduce leukotriene production to decrease inflammation 2) Blueberries- high in phytonutrients that decrease inflammation 3) Salmon- marine omega-3s reduce joint swelling and pain 4) Pumpkin seeds- reduce inflammation 5) dark chocolate- reduces inflammation 6) turmeric- reduces inflammation 7) tart cherries - reduce pain and stiffness 8) extra virgin olive oil - its compound olecanthal helps to  block prostaglandins  9) chili peppers- can be eaten or applied topically via capsaicin 10) mint- helpful for headache, muscle aches, joint pain, and itching 11) garlic- reduces inflammation  Link to further information on diet for chronic pain: [email or web address]  -Provided with a pain relief journal and discussed that it contains foods and lifestyle tips to naturally help to improve pain. Discussed that these lifestyle strategies are also very good for health unlike some medications which can have negative side effects. Discussed that the act of keeping a journal can be therapeutic and helpful to realize patterns what helps to trigger and alleviate pain.  l 3. Chronic Lower Back Pain without Sciatica: Continue HEP as Tolerated. Continue to Monitor. Referred to [facility] therapy -texted her exercises she can do to improve spinal alignment and improve pain -discussed gabapentin but she cannot use this due to history of sleepwalking on this medicine -recommended trying doTerra Deep Blue essential oil -discussed that it is ok for use to provide an early refill of oxycodone this times if she needs due to her increased pain 4. Left Knee Pain: No Complaints today. Continue HEP as Tolerated. Continue to Monitor.  Discussed Wobenzymes.  5. Muscle Spasm of Shoulders/ Upper Back : Continue Flexeril. Continue to Monitor. 6. Chronic Pain Syndrome: Refilled: Oxycodone '5mg'$ /325 mg two tablets twice a day #120. We will continue the opioid monitoring program, this consists of regular clinic visits, examinations, urine drug screen, pill counts as well as use of [place] Controlled Substance Reporting system.   7. Constipation:  -continue Linzess as prescribed by GI -Provided list of following foods that help with constipation and highlighted a few: 1) prunes- contain high amounts of fiber.  2) apples- has a form of dietary fiber called pectin that accelerates stool movement and increases beneficial gut bacteria 3) pears- in addition to fiber, also high in fructose and sorbitol which have laxative effect 4) figs- contain an enzyme ficin which helps to speed colonic transit 5) kiwis- contain an enzyme actinidin that improves gut motility and reduces constipation 6) oranges- rich in pectin (like apples) 7) grapefruits- contain a flavanol naringenin which has a laxative effect 8) vegetables- rich in fiber and also great sources of folate, vitamin C, and K 9) artichoke- high in inulin, prebiotic great for the microbiome 10) chicory- increases stool frequency and softness (can be added to coffee) 11) rhubarb- laxative effect 12) sweet potato- high fiber 13) beans, peas, and lentils- contain both soluble and insoluble fiber 14) chia seeds- improves intestinal health and gut flora 15) flaxseeds- laxative effect 16) whole grain rye bread- high in fiber 17) oat bran- high in soluble and insoluble fiber 18) kefir- softens stools -recommended to try at least one of these foods every day.  -drink 6-8 glasses of water per day -walk regularly, especially after meals.   8) Anxiety: -Continue alprazolam as prescribed by Dr. Moshe Cipro. Appreciate Eunice's counseling regarding increased risk of side effects with use of  Alprazolam and Oxycodone.  -Discussed exercise and meditation as tools to decrease anxiety. -Recommended Down Dog Yoga app -Discussed spending time outdoors. -Discussed positive re-framing of anxiety.  -Discussed the following foods that have been show to reduce anxiety: 1) [place] nuts, mushrooms, soy beans due to their high selenium content. Upper limit of toxicity of selenium is 462mg/day so no more than 3-4 bBolivianuts per day.  2) Fatty fish such as salmon, mackerel, sardines, trout, and herring- high in omega-3 fatty acids 3) Eggs- increases serotonin and dopamine 4)  Pumpkin seeds- high in omega-3 fatty acids 5) dark chocolate- high in flavanols that increase blood flow to brain 6) turmeric- take with black pepper to increase absorption 7) chamomile tea- antioxidant and anti-inflammatory properties 8) yogurt without sugar- supports gut-brain axis 9) green tea- contains L- theanine 10) blueberries- high in vitamin C and antioxidants 11) [place]- high in tryptophan which gets converted to serotonin 12) bell peppers- rich in vitamin C and antioxidants 13) citrus fruits- rich in vitamin C and antioxidants 14) almonds- high in vitamin E and healthy fats 15) chia seeds- high in omega-3 fatty acids  9) Diabetes type 2 -HgbA1c reviewed- 6.7 -continue Mounjaro, discussed that nausea is a common side effect and usually shortlived.  -check CBGs daily, log, and bring log to follow-up appointment -avoid sugar, bread, pasta, rice -avoid snacking -try to incorporate into your diet some of the following foods which are good for diabetes: 1) cinnamon- imitates effects of insulin, increasing glucose transport into cells ([place] or [place] cinnamon is best, least processed) 2) nuts- can slow down the blood sugar response of carbohydrate rich foods 3) oatmeal- contains and anti-inflammatory compound avenanthramide 4) whole-milk yogurt (best types are no sugar, [place] yogurt, or goat/sheep  yogurt) 5) beans- high in protein, fiber, and vitamins, low glycemic index 6) broccoli- great source of vitamin A and C 7) quinoa- higher in protein and fiber than other grains 8) spinach- high in vitamin A, fiber, and protein 9) olive oil- reduces glucose levels, LDL, and triglycerides 10) salmon- excellent amount of omega-3-fatty acids 11) walnuts- rich in antioxidants 12) apples- high in fiber and quercetin 13) carrots- highly nutritious with low impact on blood sugar 14) eggs- improve HDL (good cholesterol), high in protein, keep you satiated 15) turmeric: improves blood sugars, cardiovascular disease, and protects kidney health 16) garlic: improves blood sugar, blood pressure, pain 17) tomatoes: highly nutritious with low impact on blood sugar   10) Obesity: -Educated regarding health benefits of weight loss- for pain, general health, chronic disease prevention, immune health, mental health.  Reviewed current weight is 145 lbs, BMI 23,40, commended on 18 lb weight loss! -prescribed magnesium gluconate '250mg'$  HS -Will monitor weight every visit.  -Consider [place] tea daily.  -Discussed the benefits of intermittent fasting. -Discussed foods that can assist in weight loss: 1) leafy greens- high in fiber and nutrients 2) dark chocolate- improves metabolism (if prefer sweetened, best to sweeten with honey instead of sugar).  3) cruciferous vegetables- high in fiber and protein 4) full fat yogurt: high in healthy fat, protein, calcium, and probiotics 5) apples- high in a variety of phytochemicals 6) nuts- high in fiber and protein that increase feelings of fullness 7) grapefruit: rich in nutrients, antioxidants, and fiber (not to be taken with anticoagulation) 8) beans- high in protein and fiber 9) salmon- has high quality protein and healthy fats 10) green tea- rich in polyphenols 11) eggs- rich in choline and vitamin D 12) tuna- high protein, boosts metabolism 13) avocado-  decreases visceral abdominal fat 14) chicken (pasture raised): high in protein and iron 15) blueberries- reduce abdominal fat and cholesterol 16) whole grains- decreases calories retained during digestion, speeds metabolism 17) chia seeds- English appetite 18) chilies- increases fat metabolism  -Discussed supplements that can be used:  1) Metatrim '400mg'$  BID 30 minutes before breakfast and dinner  2) Sphaeranthus indicus and Garcinia mangostana (combinations of these and #1 can be found in capsicum and zychrome  3) green coffee bean extract '400mg'$  twice per day or  Irvingia (african mango) 150 to '300mg'$  twice per day.  11) Left lumbar radiculopathy: -NexWave Zynex IFS ordered -Provided with a pain relief journal and discussed that it contains foods and lifestyle tips to naturally help to improve pain. Discussed that these lifestyle strategies are also very good for health unlike some medications which can have negative side effects. Discussed that the act of keeping a journal can be therapeutic and helpful to realize patterns what helps to trigger and alleviate pain.   -MRI ordered of lumbar spine, discussed suspected diagnosis of lumbar radiculopathy given lower back pain radiating into lower extremity. Unfortunately, looks like she will be unable to get MRI due to her spinal cord stimulator. Referred to [place] for removal of spinal cord stimulator. CT lumbar spine ordered [facility] given new neurological deficits of decreased sensation. Discussed going to ED if neurological symptoms worsen -continue amitriptyline '10mg'$  HS, discussed that this could potentially help her sleep better at night without having to take a percocet at this time -discontinued meloxicam since not helping much and has several potential side effects. -reviewed the results of her CT lumbar spine.  Discussed that there are no significant concerning findings.  -discussed I will call her today once Mri results are  available.  -f/u with Dr. Gean Quint about removal of the spinal cord stimulator, discussed with [employer] rep trying to expedite this removal as this is very important to her -refilled her oxycodone -called Dr. Keene Breath office at [facility] to see if we can get records sent to Dr. Tretha Sciara office -Hopefully getting MRI will be helpful to Dr. Gean Quint for spinal cord stimulator removal well.   12) Nausea Resolved  13) Right knee OA -called insurance company to discuss why viscosupplementation was denied. They said no need for auth for Synvisc One Reference ID# [id number]  Right knee injection  Indication: Right knee pain not relieved by medication management and other conservative care.  Informed consent was obtained after describing risks and benefits of the procedure with the patient, this includes bleeding, bruising, infection and medication side effects. The patient wishes to proceed and has given written consent. The patient was placed in a recumbent position. The medial aspect of the knee was marked and prepped with Betadine and alcohol. It was then entered with a 25-gauge 1-1/2 inch needle and Synvisc One was injected into the joint after negative draw back for blood. The patient tolerated the procedure well. Post procedure instructions were given.   14) Insomnia: Insomnia: -Try to go outside near sunrise -Get exercise during the day.  -Turn off all devices an hour before bedtime.  -Teas that can benefit: chamomile, valerian root, Brahmi (Bacopa) -Can consider over the counter melatonin, magnesium, and/or L-theanine. Melatonin is an anti-oxidant with multiple health benefits. Magnesium is involved in greater than 300 enzymatic reactions in the body and most of [place] are deficient as our soil is often depleted. There are 7 different types of magnesium- Bioptemizer's is a supplement with all 7 types, and each has unique benefits. Magnesium can also help with constipation and  anxiety.  -Pistachios naturally increase the production of melatonin -Cozy Earth bamboo bed sheets are free from toxic chemicals.  -Tart cherry juice or a tart cherry supplement can improve sleep and soreness post-workout    -continue amitriptyline.   15) History of COVID infection -discussed her infection and that she is feeling better now -discussed her continued loss of smell.

## 2022-11-08 ENCOUNTER — Other Ambulatory Visit: Payer: Self-pay | Admitting: Family Medicine

## 2022-11-11 ENCOUNTER — Encounter: Payer: Self-pay | Admitting: Internal Medicine

## 2022-11-11 ENCOUNTER — Ambulatory Visit (INDEPENDENT_AMBULATORY_CARE_PROVIDER_SITE_OTHER): Payer: Medicare HMO | Admitting: Internal Medicine

## 2022-11-11 VITALS — BP 129/84 | HR 97 | Resp 16 | Ht 66.0 in | Wt 164.0 lb

## 2022-11-11 DIAGNOSIS — Z0000 Encounter for general adult medical examination without abnormal findings: Secondary | ICD-10-CM

## 2022-11-11 MED ORDER — BENZONATATE 100 MG PO CAPS: 100.0000 mg | ORAL_CAPSULE | Freq: Two times a day (BID) | ORAL | 0 refills | Status: DC | PRN

## 2022-11-11 NOTE — Progress Notes (Signed)
Subjective:   Jane English is a 52 y.o. female who presents for Medicare Annual (Subsequent) preventive examination.  Review of Systems    Review of Systems  All other systems reviewed and are negative.   Objective:    Today's Vitals   11/11/22 1513 11/11/22 1515  BP: 129/84   Pulse: 97   Resp: 16   SpO2: 93%   Weight: 164 lb (74.4 kg)   Height: '5\' 6"'$  (1.676 m)   PainSc:  8    Body mass index is 26.47 kg/m.     06/05/2022    7:55 PM 11/13/2021    4:48 PM 11/08/2021    8:14 AM 11/01/2020    9:59 AM 01/29/2020    9:39 PM 11/15/2019    3:16 PM 02/04/2019    1:59 PM  Advanced Directives  Does Patient Have a Medical Advance Directive? No No No No No No No  Would patient like information on creating a medical advance directive? No - Patient declined Yes (MAU/Ambulatory/Procedural Areas - Information given) No - Patient declined No - Patient declined No - Patient declined No - Patient declined     Current Medications (verified) Outpatient Encounter Medications as of 11/11/2022  Medication Sig   albuterol (PROVENTIL) (2.5 MG/3ML) 0.083% nebulizer solution USE 1 VIAL IN NEBULIZER EVERY 6 HOURS AS NEEDED FOR WHEEZING OR SHORTNESS OR BREATH (Patient taking differently: Take 2.5 mg by nebulization every 6 (six) hours as needed.)   albuterol (VENTOLIN HFA) 108 (90 Base) MCG/ACT inhaler TAKE 2 PUFFS BY MOUTH EVERY 6 HOURS AS NEEDED FOR WHEEZE OR SHORTNESS OF BREATH   amitriptyline (ELAVIL) 10 MG tablet TAKE 1 TABLET BY MOUTH EVERYDAY AT BEDTIME   beclomethasone (QVAR REDIHALER) 40 MCG/ACT inhaler Inhale 2 puffs into the lungs 2 (two) times daily.   benzonatate (TESSALON) 100 MG capsule Take 1 capsule (100 mg total) by mouth 2 (two) times daily as needed for cough.   blood glucose meter kit and supplies Dispense based on patient and insurance preference. Once daily testing DX E11.9   BREO ELLIPTA 100-25 MCG/ACT AEPB INHALE 1 PUFF INTO THE LUNGS TWICE A DAY   cloNIDine (CATAPRES) 0.1 MG  tablet TAKE 1 TABLET BY MOUTH EVERYDAY AT BEDTIME   clotrimazole-betamethasone (LOTRISONE) cream APPLY 1 APPLICATION TOPICALLY TWICE A DAY   cyclobenzaprine (FLEXERIL) 5 MG tablet Take 1 tablet (5 mg total) by mouth 3 (three) times daily as needed for muscle spasms.   diclofenac Sodium (VOLTAREN) 1 % GEL APPLY 2 GRAMS TO AFFECTED AREA 4 TIMES A DAY   EPINEPHrine 0.3 mg/0.3 mL IJ SOAJ injection as needed.   ergocalciferol (VITAMIN D2) 1.25 MG (50000 UT) capsule Take 1 capsule (50,000 Units total) by mouth once a week. One capsule once weekly   fexofenadine-pseudoephedrine (ALLEGRA-D 24) 180-240 MG 24 hr tablet Take 1 tablet by mouth daily.   fluticasone (FLONASE) 50 MCG/ACT nasal spray PLACE 1-2 SPRAYS INTO BOTH NOSTRILS DAILY.   FLUZONE QUADRIVALENT 0.5 ML injection    glucose blood test strip Use as instructed once daily dx e11.9   hydrOXYzine (ATARAX) 10 MG tablet    ipratropium (ATROVENT) 0.03 % nasal spray Place 2 sprays into both nostrils every 12 (twelve) hours.   Lancets 30G MISC Once daily testing dx e11.9   lidocaine (XYLOCAINE) 2 % jelly    linaclotide (LINZESS) 290 MCG CAPS capsule TAKE 1 CAPSULE (290 MCG TOTAL) BY MOUTH DAILY BEFORE BREAKFAST.   loratadine (CLARITIN) 10 MG tablet TAKE 1 TABLET  BY MOUTH EVERY DAY   lubiprostone (AMITIZA) 24 MCG capsule Take 1 capsule (24 mcg total) by mouth 2 (two) times daily with a meal.   magnesium oxide (MAG-OX) 400 MG tablet Take 1 tablet (400 mg total) by mouth 2 (two) times daily.   meloxicam (MOBIC) 7.5 MG tablet Take 1 tablet (7.5 mg total) by mouth 2 (two) times daily as needed for pain.   methocarbamol (ROBAXIN) 500 MG tablet Take 1 tablet (500 mg total) by mouth 4 (four) times daily.   montelukast (SINGULAIR) 10 MG tablet TAKE 1 TABLET BY MOUTH EVERYDAY AT BEDTIME   NARCAN 4 MG/0.1ML LIQD nasal spray kit Place 4 mg into the nose as directed.   NONFORMULARY OR COMPOUNDED ITEM Apply 1-2 pumps, 3-4 times daily as needed.   olopatadine  (PATANOL) 0.1 % ophthalmic solution INSTILL 1 DROP INTO BOTH EYES TWICE A DAY (OTC)   ondansetron (ZOFRAN) 4 MG tablet Take 1 tablet (4 mg total) by mouth every 8 (eight) hours as needed for nausea or vomiting.   oxybutynin (DITROPAN-XL) 10 MG 24 hr tablet TAKE 1 TABLET BY MOUTH EVERYDAY AT BEDTIME   oxyCODONE (ROXICODONE) 5 MG immediate release tablet Take 2 tablets (10 mg total) by mouth 2 (two) times daily.   pantoprazole (PROTONIX) 40 MG tablet Take 1 tablet (40 mg total) by mouth daily.   potassium chloride 20 MEQ/15ML (10%) SOLN Take 15 mLs (20 mEq total) by mouth 2 (two) times daily.   promethazine-dextromethorphan (PROMETHAZINE-DM) 6.25-15 MG/5ML syrup Take one teaspoon by mouth twice daily as needed, for excessive cough   solifenacin (VESICARE) 5 MG tablet TAKE 1 TABLET (5 MG TOTAL) BY MOUTH DAILY.   SPIKEVAX injection Inject into the muscle.   terbinafine (LAMISIL) 250 MG tablet Take 1 tablet (250 mg total) by mouth daily.   Tiotropium Bromide Monohydrate (SPIRIVA RESPIMAT) 1.25 MCG/ACT AERS Inhale 2 puffs into the lungs daily.   tirzepatide (MOUNJARO) 7.5 MG/0.5ML Pen Inject 7.5 mg into the skin once a week.   topiramate (TOPAMAX) 25 MG tablet Take 1 tablet (25 mg total) by mouth 2 (two) times daily.   trolamine salicylate (ASPERCREME) 10 % cream Apply 1 application topically as needed for muscle pain.    Facility-Administered Encounter Medications as of 11/11/2022  Medication   betamethasone acetate-betamethasone sodium phosphate (CELESTONE) injection 6 mg   lidocaine (PF) (XYLOCAINE) 1 % injection 2 mL   lidocaine (XYLOCAINE) 1 % (with pres) injection 3 mL   Mepolizumab SOLR 100 mg   sodium chloride (PF) 0.9 % injection 3 mL    Allergies (verified) Neurontin [gabapentin], Penicillins, Pregabalin, Tramadol, and Latex   History: Past Medical History:  Diagnosis Date   Anemia    Asthma    Asthma flare 04/09/2013   Back pain    Bronchitis    Chronic abdominal pain    Chronic  constipation    Constipation due to opioid therapy    Depression    Depression, major, single episode, severe ([place]) 10/03/2018   PHQ 9 score of 15   Diabetes mellitus without complication ([place])    Diabetes mellitus, type II ([place])    DVT (deep venous thrombosis) ([place]) 2010   GERD (gastroesophageal reflux disease)    Heart murmur    no cardiologist   Helicobacter pylori gastritis 06/11/2013   Colonoscopy Dr. Hilarie Fredrickson   Hypertension    IBS (irritable bowel syndrome)    Migraine headache    Neuropathy    Obesity    Obsessive-compulsive disorder  PSYCHOTIC D/O W/HALLUCINATIONS CONDS CLASS ELSW 03/04/2010   Qualifier: Diagnosis of  By: Moshe Cipro MD, Joycelyn Schmid     PTSD (post-traumatic stress disorder)    SBO (small bowel obstruction) ([place]) 08/09/2013   Seasonal allergies 12/10/2012   Seizures ([place])    Shortness of breath    Past Surgical History:  Procedure Laterality Date   ANTERIOR CERVICAL DECOMP/DISCECTOMY FUSION  07/07/2012   Procedure: ANTERIOR CERVICAL DECOMPRESSION/DISCECTOMY FUSION 2 LEVELS;  Surgeon: Floyce Stakes, MD;  Location: [facility];  Service: Neurosurgery;  Laterality: N/A;  Cervical four-five, five - six  Anterior cervical decompression/diskectomy/fusion/plate   APPENDECTOMY  1986   BOWEL RESECTION N/A 07/29/2013   Procedure: serosal repair;  Surgeon: Adin Hector, MD;  Location: [facility];  Service: General;  Laterality: N/A;   CARPAL TUNNEL RELEASE Bilateral    COLON SURGERY N/A    [facility] 07/29/2020   LAPAROSCOPY N/A 07/29/2013   Procedure: diagnostic laporoscopy;  Surgeon: Adin Hector, MD;  Location: [facility];  Service: General;  Laterality: N/A;   LAPAROSCOPY N/A 08/16/2013   Procedure: LAPAROSCOPY DIAGNOSTIC/LYSIS OF ADHESIONS;  Surgeon: Adin Hector, MD;  Location: [facility];  Service: General;  Laterality: N/A;   LAPAROTOMY N/A 08/16/2013   Procedure: EXPLORATORY LAPAROTOMY/SMALL BOWEL RESECTION (JEJUNUM);  Surgeon: Adin Hector, MD;  Location: [facility];   Service: General;  Laterality: N/A;   LUMBAR SPINE SURGERY  2010   x 3   LYSIS OF ADHESION  2003   Dr. Irving Shows   LYSIS OF ADHESION N/A 07/29/2013   Procedure: LYSIS OF ADHESION;  Surgeon: Adin Hector, MD;  Location: [facility];  Service: General;  Laterality: N/A;   [place]?   Linna Hoff, [place]   SPINAL CORD STIMULATOR IMPLANT     SPINE SURGERY N/A    [facility] 07/29/2020   TRIGGER FINGER RELEASE  2009   right pinkie finger   TUBAL LIGATION  1994   Family History  Problem Relation Age of Onset   Physical abuse Mother    Alcohol abuse Mother    Cirrhosis Mother    Lung cancer Father    Stomach cancer Father    Esophageal cancer Father    Alcohol abuse Father    Mental illness Father    Diabetes Sister    Hypertension Sister    Bipolar disorder Sister    Schizophrenia Sister    Diabetes Sister    Drug abuse Sister    HIV Sister    Pneumonia Sister        died as a baby   Alcohol abuse Brother    Hypertension Brother    Kidney disease Brother    Diabetes Brother    Drug abuse Brother    Mental illness Brother    Alcohol abuse Brother    Alcohol abuse Brother    Hypertension Brother    Diabetes Brother    Alcohol abuse Brother    Mental illness Brother        in [place]   Alcohol abuse Brother    Alcohol abuse Brother    Bipolar disorder Brother    Hypertension Brother    Bipolar disorder Brother    Drug abuse Brother    Alcohol abuse Brother    Bipolar disorder Brother    Bipolar disorder Daughter    Bipolar disorder Son    Bipolar disorder Son    ADD / ADHD Neg Hx    Anxiety  disorder Neg Hx    Dementia Neg Hx    Depression Neg Hx    OCD Neg Hx    Seizures Neg Hx    Paranoid behavior Neg Hx    Colon cancer Neg Hx    Rectal cancer Neg Hx    Social History   Socioeconomic History   Marital status: Married    Spouse name: Herbie [place]   Number of children: 4   Years of education: 12   Highest education level: Some  college, no degree  Occupational History   Occupation: disabled  Tobacco Use   Smoking status: Never   Smokeless tobacco: Never  Vaping Use   Vaping Use: Never used  Substance and Sexual Activity   Alcohol use: No   Drug use: No   Sexual activity: Not Currently    Partners: Male    Birth control/protection: Surgical    Comment: tubal  Other Topics Concern   Not on file  Social History Narrative   Not on file   Social Determinants of Health   Financial Resource Strain: Low Risk  (11/08/2021)   Overall Financial Resource Strain (CARDIA)    Difficulty of Paying Living Expenses: Not hard at all  Food Insecurity: No Food Insecurity (11/08/2021)   Hunger Vital Sign    Worried About Running Out of Food in the Last Year: Never true    Ran Out of Food in the Last Year: Never true  Transportation Needs: No Transportation Needs (11/08/2021)   PRAPARE - Hydrologist (Medical): No    Lack of Transportation (Non-Medical): No  Physical Activity: Insufficiently Active (11/08/2021)   Exercise Vital Sign    Days of Exercise per Week: 3 days    Minutes of Exercise per Session: 30 min  Stress: No Stress Concern Present (11/08/2021)   [place]    Feeling of Stress : Only a little  Social Connections: Moderately Isolated (11/08/2021)   Social Connection and Isolation Panel [NHANES]    Frequency of Communication with Friends and Family: More than three times a week    Frequency of Social Gatherings with Friends and Family: Once a week    Attends Religious Services: Never    Marine scientist or Organizations: No    Attends Music therapist: Never    Marital Status: Married    Tobacco Counseling Counseling given: Not Answered   Clinical Intake:  Pre-visit preparation completed: Yes  Pain : 0-10 Pain Score: 8  Pain Location: Back        How often do you need to have someone  help you when you read instructions, pamphlets, or other written materials from your doctor or pharmacy?: 1 - Never What is the last grade level you completed in school?: 12th grade,and some certificates  Activities of Daily Living    11/11/2022    3:18 PM  In your present state of health, do you have any difficulty performing the following activities:  Hearing? 0  Vision? 0  Difficulty concentrating or making decisions? 0  Walking or climbing stairs? 0  Dressing or bathing? 0  Doing errands, shopping? 0    Patient Care Team: Fayrene Helper, MD as PCP - General Pyrtle, Lajuan Lines, MD as Consulting Physician (Gastroenterology) Cloria Spring, MD as Consulting Physician ([place]) [place], Willey Blade, PA-C as Physician Assistant (Physician Assistant)  Indicate any recent Medical Services you may have received from other  than [facility] providers in the past year (date may be approximate).     Assessment:   This is a routine wellness examination for [place].  Hearing/Vision screen No results found.  Dietary issues and exercise activities discussed:     Goals Addressed   None    Depression Screen    11/11/2022    3:17 PM 10/15/2022   10:05 AM 09/20/2022    9:05 AM 09/06/2022    9:45 AM 08/06/2022   10:01 AM 06/20/2022    9:05 AM 05/30/2022   11:19 AM  PHQ 2/9 Scores  PHQ - 2 Score 2 0 0 0 0 0 0    Fall Risk    11/11/2022    3:17 PM 10/15/2022   10:05 AM 09/20/2022    9:05 AM 09/06/2022    9:45 AM 08/06/2022   10:01 AM  Fall Risk   Falls in the past year? 1 0 0 0 0  Number falls in past yr: 1 0 0    Injury with Fall? 0 0 0    Risk for fall due to :   No Fall Risks    Follow up   Falls evaluation completed      [place]:  Any stairs in or around the home? Yes  If so, are there any without handrails? Yes  Home free of loose throw rugs in walkways, pet beds, electrical cords, etc? Yes  Adequate lighting in your home to reduce  risk of falls? Yes   ASSISTIVE DEVICES UTILIZED TO PREVENT FALLS:  Life alert? No  Use of a cane, walker or w/c? No  Grab bars in the bathroom? Yes  Shower chair or bench in shower? No  Elevated toilet seat or a handicapped toilet? No     Cognitive Function:        11/08/2021    8:19 AM 11/01/2020   10:04 AM 03/05/2017    2:40 PM  6CIT Screen  What Year? 0 points 0 points 0 points  What month? 0 points 0 points 0 points  What time? 0 points 0 points 0 points  Count back from 20 0 points 0 points 0 points  Months in reverse 4 points 0 points 0 points  Repeat phrase 2 points 2 points 0 points  Total Score 6 points 2 points 0 points    Immunizations Immunization History  Administered Date(s) Administered   Influenza Split 09/03/2012   Influenza Whole 07/28/2007, 08/11/2009, 07/19/2010   Influenza,inj,Quad PF,6+ Mos 09/13/2013, 08/12/2014, 08/01/2015, 07/24/2016, 08/14/2017, 08/12/2018, 08/10/2019, 07/11/2020, 06/20/2022   Influenza-Unspecified 05/24/2018, 08/29/2022   Moderna SARS-COV2 Booster Vaccination 08/29/2022   Moderna Sars-Covid-2 Vaccination 07/21/2020, 08/14/2020, 03/12/2021   PNEUMOCOCCAL CONJUGATE-20 03/27/2022   PPD Test 02/22/2011, 06/26/2011, 09/18/2012, 11/29/2013, 12/06/2014, 12/09/2017, 01/15/2022, 02/01/2022   Pneumococcal Polysaccharide-23 05/14/2021   Td 10/03/2005   Tdap 12/09/2017   Zoster Recombinat (Shingrix) 06/27/2021, 03/27/2022    TDAP status: Up to date  Flu Vaccine status: Up to date  Pneumococcal vaccine status: Up to date  Covid-19 vaccine status: Completed vaccines  Qualifies for Shingles Vaccine? No     Screening Tests Health Maintenance  Topic Date Due   COVID-19 Vaccine (4 - 2023-24 season) 10/24/2022   Medicare Annual Wellness (AWV)  11/08/2022   HEMOGLOBIN A1C  03/24/2023   Diabetic kidney evaluation - Urine ACR  09/21/2023   Diabetic kidney evaluation - eGFR measurement  09/24/2023   MAMMOGRAM  03/28/2024   PAP  SMEAR-Modifier  03/21/2025  COLONOSCOPY (Pts 45-18yr Insurance coverage will need to be confirmed)  10/12/2026   DTaP/Tdap/Td (3 - Td or Tdap) 12/10/2027   INFLUENZA VACCINE  Completed   Hepatitis C Screening  Completed   HIV Screening  Completed   Zoster Vaccines- Shingrix  Completed   HPV VACCINES  Aged Out    Health Maintenance  Health Maintenance Due  Topic Date Due   COVID-19 Vaccine (4 - 2023-24 season) 10/24/2022   Medicare Annual Wellness (AWV)  11/08/2022    Colorectal cancer screening: Type of screening: Colonoscopy. Completed 10/12/2021. Repeat every 5 years  Mammogram status: Completed 03/21/2022. Repeat every year  Lung Cancer Screening: (Low Dose CT Chest recommended if Age 54-80years, 30 pack-year currently smoking OR have quit w/in 15years.) does not qualify.   Additional Screening:  Hepatitis C Screening: does not qualify; Completed   Vision Screening: Recommended annual ophthalmology exams for early detection of glaucoma and other disorders of the eye. Is the patient up to date with their annual eye exam?  Yes  Who is the provider or what is the name of the office in which the patient attends annual eye exams? FA[facility]ilarIf pt is not established with a provider, would they like to be referred to a provider to establish care? No .   Dental Screening: Recommended annual dental exams for proper oral hygiene  Community Resource Referral / Chronic Care Management: CRR required this visit?  No   CCM required this visit?  No      Plan:     I have personally reviewed and noted the following in the patient's chart:   Medical and social history Use of alcohol, tobacco or illicit drugs  Current medications and supplements including opioid prescriptions. Patient is currently taking opioid prescriptions. Information provided to patient regarding non-opioid alternatives. Patient advised to discuss non-opioid treatment plan with their provider. Functional ability  and status Nutritional status Physical activity Advanced directives List of other physicians Hospitalizations, surgeries, and ER visits in previous 12 months Vitals Screenings to include cognitive, depression, and falls Referrals and appointments  In addition, I have reviewed and discussed with patient certain preventive protocols, quality metrics, and best practice recommendations. A written personalized care plan for preventive services as well as general preventive health recommendations were provided to patient.     JLorene Dy MD   11/11/2022

## 2022-11-11 NOTE — Patient Instructions (Signed)
  Jane English , Thank you for taking time to come for your Medicare Wellness Visit. I appreciate your ongoing commitment to your health goals. Please review the following plan we discussed and let me know if I can assist you in the future.   These are the goals we discussed: Taking things day by day.   This is a list of the screening recommended for you and due dates:  Health Maintenance  Topic Date Due   COVID-19 Vaccine (4 - 2023-24 season) 10/24/2022   Medicare Annual Wellness Visit  11/08/2022   Hemoglobin A1C  03/24/2023   Yearly kidney health urinalysis for diabetes  09/21/2023   Yearly kidney function blood test for diabetes  09/24/2023   Mammogram  03/28/2024   Pap Smear  03/21/2025   Colon Cancer Screening  10/12/2026   DTaP/Tdap/Td vaccine (3 - Td or Tdap) 12/10/2027   Flu Shot  Completed   Hepatitis C Screening: USPSTF Recommendation to screen - Ages 18-79 yo.  Completed   HIV Screening  Completed   Zoster (Shingles) Vaccine  Completed   HPV Vaccine  Aged Out

## 2022-11-14 ENCOUNTER — Telehealth: Payer: Self-pay | Admitting: Internal Medicine

## 2022-11-14 MED ORDER — LUBIPROSTONE 24 MCG PO CAPS: 24.0000 ug | ORAL_CAPSULE | Freq: Two times a day (BID) | ORAL | 0 refills | Status: DC

## 2022-11-14 NOTE — Telephone Encounter (Signed)
Patient states she has been having trouble with getting her Amitiza refilled. Informed patient that I will send a refill to her pharmacy but I will also send to our PA team since it looks like her PA for Amitiza has expired. Informed patient she is also due for an office visit with Dr. Hilarie Fredrickson. Patient scheduled appt for 12/31/22. Please help get PA started for patient's Amitiza. Thanks

## 2022-11-14 NOTE — Telephone Encounter (Signed)
Patient called, stated she needs refill for Amitiza, Patient has not been seen since November of 2022. Tried to schedule appointment with patient in order to get Medication refill. Patient denied and requested nurse to call her. Please advise.

## 2022-11-19 DIAGNOSIS — M961 Postlaminectomy syndrome, not elsewhere classified: Secondary | ICD-10-CM | POA: Diagnosis not present

## 2022-11-20 ENCOUNTER — Other Ambulatory Visit (HOSPITAL_COMMUNITY): Payer: Self-pay

## 2022-11-20 NOTE — Telephone Encounter (Signed)
PA not needed. Pt picked up at pharmacy on 1.15.24 3 month supply with a $0 copay

## 2022-11-29 DIAGNOSIS — M961 Postlaminectomy syndrome, not elsewhere classified: Secondary | ICD-10-CM | POA: Diagnosis not present

## 2022-12-09 ENCOUNTER — Encounter: Payer: Self-pay | Admitting: Physical Medicine and Rehabilitation

## 2022-12-09 ENCOUNTER — Encounter: Payer: Medicare HMO | Attending: Physical Medicine and Rehabilitation | Admitting: Physical Medicine and Rehabilitation

## 2022-12-09 VITALS — BP 105/70 | HR 90 | Ht 66.0 in | Wt 160.0 lb

## 2022-12-09 DIAGNOSIS — M961 Postlaminectomy syndrome, not elsewhere classified: Secondary | ICD-10-CM | POA: Insufficient documentation

## 2022-12-09 DIAGNOSIS — G4701 Insomnia due to medical condition: Secondary | ICD-10-CM | POA: Diagnosis not present

## 2022-12-09 DIAGNOSIS — F4310 Post-traumatic stress disorder, unspecified: Secondary | ICD-10-CM | POA: Insufficient documentation

## 2022-12-09 DIAGNOSIS — G894 Chronic pain syndrome: Secondary | ICD-10-CM

## 2022-12-09 MED ORDER — OXYCODONE HCL 5 MG PO TABS: 10.0000 mg | ORAL_TABLET | Freq: Two times a day (BID) | ORAL | 0 refills | Status: DC

## 2022-12-09 NOTE — Addendum Note (Signed)
Addended by: Izora Ribas on: 12/09/2022 11:38 AM   Modules accepted: Orders

## 2022-12-09 NOTE — Progress Notes (Signed)
Subjective:    Patient ID: Jane English, female    DOB: 04/23/1971, 52 y.o.   MRN: [id number]  HPI:    Jane English is a 52 y.o. female who returns for f/u appointment for chronic pain in her back secondary to failed back syndrome, bilateral knees secondary to chonrdomalacia of bilateral knees, pain in bilateral hands secondary to osteoarthritis, left sided lower back pain  1) Failed back syndrome -has surgery next week to remove the stimulator -her son is coming to help her until the 14th.  -lost her medicine since last visit, this is the first time she ever did this and we provided an early refill -she told me how she lost the medication -once she got to [facility] for her son's house she was feeling very drained from the stress of losing her medicine and searching for it, plus her concomitant allergies and COVID injection -she would like to get her spinal cord stimulator removed, discussed that I have discussed with the [employer] rep -once she gets moving pain is better, but she feels grinding with movement -got her MRI done yesterday -has appointment to remove the stimulator -has been using the TENS machine. Covered by insurance -she has called Dr. Keene Breath office and the records have not been called, I called today to see if this can be expedited.  -needs refill of oxycodone today -pain has been stable -she continues to have severe pain -she has been using the 7.'5mg'$  meloxicam up to three times per day -she says she needs [place] to reach out to her former pain clinic to help get her medical records -Her current exercise regimen is walking and performing stretching exercises. -she is reporting decreased sensation in left lower extremity compared to right -pain is still severe despite starting meloxicam and robaxin -her hardware is MRI compatible.  -she asks when she get her spinal cord stimulator removed. She does not want to go back to her old Psychologist, sport and exercise -Last UDS was Performed on 01/26/2021,  it was consistent.  -She has been taking oxycodone PRN (30MME) for years- it is helpful for her pain -she has had negative responses with Gabapentin and Lyrica.  -pain has been really severe recently.  -she has never tried Cymbalta.  -she has been experiencing more left sided low back pain since she has started working at [employer] -no more falls.  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 -she asks if a lady reached out to me about the stimulator machine, she would like to get her spinal cord stimulator removed. She does not want to get this removed where it was originally placed, but would rather get it removed at [place]  2) Chondromalacia of bilateral knees/ left lateral meniscal injury -had knee surgery on the 21st, her son said it lasted 10 minutes -she was sent hydrocodone but has not taken it -has been taking her long standing oxycodone.  -stitches removed -right knee has been feeling good, left knee is still bothering her -she ordered herself an exercise stepper  -she was told not to overdo her exercise  -she is on the phone with her insurance company regarding viscosupplementation coverage- she has had this done several times and it has been helpful.   3) Anxiety -she receives Alprazolam from Dr. Moshe Cipro, PCP- dose was recently reduced in half.  [name] has discussed the black box warning of using  opioids and benzodiazepines and highlighted the dangers of using these drugs together and discussed the adverse events including respiratory suppression, overdose, cognitive impairment and importance of compliance with current regimen.  -she has been referred to therapy by her PCP.   4) Osteoarthritis of bilateral hands  5) Right buttock pain  6) Myofascial pain  7) Chronic constipation: -has followed with GI since last visit  8) Prediabetes -she was advised by GI to discuss  Wegovy vs. Ozempic with her PCP for weight loss- will also help with diabetes.  -there is plan for repeat colostomy- she had a bad experience with this last time.  -taking Monjouro -she lost weight from 190s to 180s with this -she does not eat as much with this -she gets sick when she eats things too sweet  9) Nausea: she does feel some some nausea with her pain medications.   10) Low mood -at times -she is handling at well.  -She loves to meet new people through her work.  -she has started a new piercing job.   11) Left lumbar radiculopathy: - it is currently really bothering her  -the meloxicam and muscle relaxers help temporarily but not for long -it is severe all the time but she is still working through it -she is unable to have an MRI due to the spinal cord stimulator- she would like to get this removed as soon as possible - she continues on oxycodone -she purchased an over the counter heating oil but has not tried that -she cannot tolerate gabapentin since it has caused her to sleep walk before -she has had a hard time going to work due to the pain -she has tried amitriptyline in the past but was started on '150mg'$  and this made her feel drunk. She has not tried '10mg'$ .  -pain has been very severe -feels shooting pain throughout left leg -notes weakness -working through the pain.  -she is more interested in surgery than injections as she has not had benefit from injections before.   12) Left foot swelling and numbness: -she feels that when she applies heat it improves the numbness  13) Insomnia -she has been sleeping better since starting Amitriptyline  14) Recent COVID infection: -feels well today -had a cough and could hardly breath when she recently visited her son  81) PTSD: -she has a history of trauma to herself and her daughter   Pain Inventory Average Pain 9 Pain Right Now 7 My pain is constant, sharp, burning, dull, stabbing, tingling, and aching  In the  last 24 hours, has pain interfered with the following? General activity 8 Relation with others 8 Enjoyment of life 10 What TIME of day is your pain at its worst? Morning, evening and night Sleep (in general) Good  Pain is worse with: walking, bending, sitting, inactivity, standing, and some activites Pain improves with: rest, heat/ice, and medication Relief from Meds: good  Family History  Problem Relation Age of Onset   Physical abuse Mother    Alcohol abuse Mother    Cirrhosis Mother    Lung cancer Father    Stomach cancer Father    Esophageal cancer Father    Alcohol abuse Father    Mental illness Father    Diabetes Sister    Hypertension Sister    Bipolar disorder Sister    Schizophrenia Sister    Diabetes Sister    Drug abuse Sister    HIV Sister    Pneumonia Sister  died as a baby   Alcohol abuse Brother    Hypertension Brother    Kidney disease Brother    Diabetes Brother    Drug abuse Brother    Mental illness Brother    Alcohol abuse Brother    Alcohol abuse Brother    Hypertension Brother    Diabetes Brother    Alcohol abuse Brother    Mental illness Brother        in [place]   Alcohol abuse Brother    Alcohol abuse Brother    Bipolar disorder Brother    Hypertension Brother    Bipolar disorder Brother    Drug abuse Brother    Alcohol abuse Brother    Bipolar disorder Brother    Bipolar disorder Daughter    Bipolar disorder Son    Bipolar disorder Son    ADD / ADHD Neg Hx    Anxiety disorder Neg Hx    Dementia Neg Hx    Depression Neg Hx    OCD Neg Hx    Seizures Neg Hx    Paranoid behavior Neg Hx    Colon cancer Neg Hx    Rectal cancer Neg Hx    Social History   Socioeconomic History   Marital status: Married    Spouse name: Herbie [place]   Number of children: 4   Years of education: 12   Highest education level: Some college, no degree  Occupational History   Occupation: disabled  Tobacco Use   Smoking status: Never   Smokeless  tobacco: Never  Vaping Use   Vaping Use: Never used  Substance and Sexual Activity   Alcohol use: No   Drug use: No   Sexual activity: Not Currently    Partners: Male    Birth control/protection: Surgical    Comment: tubal  Other Topics Concern   Not on file  Social History Narrative   Not on file   Social Determinants of Health   Financial Resource Strain: Low Risk  (11/08/2021)   Overall Financial Resource Strain (CARDIA)    Difficulty of Paying Living Expenses: Not hard at all  Food Insecurity: No Food Insecurity (11/08/2021)   Hunger Vital Sign    Worried About Running Out of Food in the Last Year: Never true    Ran Out of Food in the Last Year: Never true  Transportation Needs: No Transportation Needs (11/08/2021)   PRAPARE - Hydrologist (Medical): No    Lack of Transportation (Non-Medical): No  Physical Activity: Insufficiently Active (11/08/2021)   Exercise Vital Sign    Days of Exercise per Week: 3 days    Minutes of Exercise per Session: 30 min  Stress: No Stress Concern Present (11/08/2021)   [place]    Feeling of Stress : Only a little  Social Connections: Moderately Isolated (11/08/2021)   Social Connection and Isolation Panel [NHANES]    Frequency of Communication with Friends and Family: More than three times a week    Frequency of Social Gatherings with Friends and Family: Once a week    Attends Religious Services: Never    Marine scientist or Organizations: No    Attends Archivist Meetings: Never    Marital Status: Married   Past Surgical History:  Procedure Laterality Date   ANTERIOR CERVICAL DECOMP/DISCECTOMY FUSION  07/07/2012   Procedure: ANTERIOR CERVICAL DECOMPRESSION/DISCECTOMY FUSION 2 LEVELS;  Surgeon: Floyce Stakes, MD;  Location: [facility];  Service: Neurosurgery;  Laterality: N/A;  Cervical four-five, five - six  Anterior cervical  decompression/diskectomy/fusion/plate   APPENDECTOMY  1986   BOWEL RESECTION N/A 07/29/2013   Procedure: serosal repair;  Surgeon: Adin Hector, MD;  Location: [facility];  Service: General;  Laterality: N/A;   CARPAL TUNNEL RELEASE Bilateral    COLON SURGERY N/A    Phreesia 07/29/2020   LAPAROSCOPY N/A 07/29/2013   Procedure: diagnostic laporoscopy;  Surgeon: Adin Hector, MD;  Location: [facility];  Service: General;  Laterality: N/A;   LAPAROSCOPY N/A 08/16/2013   Procedure: LAPAROSCOPY DIAGNOSTIC/LYSIS OF ADHESIONS;  Surgeon: Adin Hector, MD;  Location: [facility];  Service: General;  Laterality: N/A;   LAPAROTOMY N/A 08/16/2013   Procedure: EXPLORATORY LAPAROTOMY/SMALL BOWEL RESECTION (JEJUNUM);  Surgeon: Adin Hector, MD;  Location: [facility];  Service: General;  Laterality: N/A;   LUMBAR SPINE SURGERY  2010   x 3   LYSIS OF ADHESION  2003   Dr. Irving Shows   LYSIS OF ADHESION N/A 07/29/2013   Procedure: LYSIS OF ADHESION;  Surgeon: Adin Hector, MD;  Location: [facility];  Service: General;  Laterality: N/A;   [place]?   [place], [place]   SPINAL CORD STIMULATOR IMPLANT     SPINE SURGERY N/A    Phreesia 07/29/2020   TRIGGER FINGER RELEASE  2009   right pinkie finger   TUBAL LIGATION  1994   Past Surgical History:  Procedure Laterality Date   ANTERIOR CERVICAL DECOMP/DISCECTOMY FUSION  07/07/2012   Procedure: ANTERIOR CERVICAL DECOMPRESSION/DISCECTOMY FUSION 2 LEVELS;  Surgeon: Floyce Stakes, MD;  Location: [facility];  Service: Neurosurgery;  Laterality: N/A;  Cervical four-five, five - six  Anterior cervical decompression/diskectomy/fusion/plate   APPENDECTOMY  1986   BOWEL RESECTION N/A 07/29/2013   Procedure: serosal repair;  Surgeon: Adin Hector, MD;  Location: [facility];  Service: General;  Laterality: N/A;   CARPAL TUNNEL RELEASE Bilateral    COLON SURGERY N/A    Phreesia 07/29/2020   LAPAROSCOPY N/A 07/29/2013   Procedure:  diagnostic laporoscopy;  Surgeon: Adin Hector, MD;  Location: [facility];  Service: General;  Laterality: N/A;   LAPAROSCOPY N/A 08/16/2013   Procedure: LAPAROSCOPY DIAGNOSTIC/LYSIS OF ADHESIONS;  Surgeon: Adin Hector, MD;  [facility];  Service: General;  Laterality: N/A;   LAPAROTOMY N/A 08/16/2013   Procedure: EXPLORATORY LAPAROTOMY/SMALL BOWEL RESECTION (JEJUNUM);  Surgeon: Adin Hector, MD;  [facility];  Service: General;  Laterality: N/A;   LUMBAR SPINE SURGERY  2010   x 3   LYSIS OF ADHESION  2003   Dr. Irving Shows   LYSIS OF ADHESION N/A 07/29/2013   Procedure: LYSIS OF ADHESION;  Surgeon: Adin Hector, MD;  Location: [facility];  Service: General;  Laterality: N/A;   [place]?   [place], [place]   SPINAL CORD STIMULATOR IMPLANT     SPINE SURGERY N/A    Phreesia 07/29/2020   TRIGGER FINGER RELEASE  2009   right pinkie finger   TUBAL LIGATION  1994   Past Medical History:  Diagnosis Date   Anemia    Asthma    Asthma flare 04/09/2013   Back pain    Bronchitis    Chronic abdominal pain    Chronic constipation    Constipation due to opioid therapy    Depression  Depression, major, single episode, severe ([place]) 10/03/2018   PHQ 9 score of 15   Diabetes mellitus without complication ([place])    Diabetes mellitus, type II ([place])    DVT (deep venous thrombosis) ([place]) 2010   GERD (gastroesophageal reflux disease)    Heart murmur    no cardiologist   Helicobacter pylori gastritis 06/11/2013   Colonoscopy Dr. Hilarie Fredrickson   Hypertension    IBS (irritable bowel syndrome)    Migraine headache    Neuropathy    Obesity    Obsessive-compulsive disorder    PSYCHOTIC D/O W/HALLUCINATIONS CONDS CLASS ELSW 03/04/2010   Qualifier: Diagnosis of  By: Moshe Cipro MD, Margaret     PTSD (post-traumatic stress disorder)    SBO (small bowel obstruction) ([place]) 08/09/2013   Seasonal allergies 12/10/2012   Seizures ([place])    Shortness of breath    There  were no vitals taken for this visit.  Opioid Risk Score:   Fall Risk Score:  `1  Depression screen [facility] 2/9     11/11/2022    3:17 PM 10/15/2022   10:05 AM 09/20/2022    9:05 AM 09/06/2022    9:45 AM 08/06/2022   10:01 AM 06/20/2022    9:05 AM 05/30/2022   11:19 AM  Depression screen PHQ 2/9  Decreased Interest 1 0 0 0 0 0 0  Down, Depressed, Hopeless 1 0 0 0 0 0 0  PHQ - 2 Score 2 0 0 0 0 0 0    Review of Systems  Constitutional: Negative.   HENT: Negative.    Eyes: Negative.   Respiratory: Negative.    Cardiovascular: Negative.   Gastrointestinal: Negative.   Endocrine: Negative.   Genitourinary: Negative.   Musculoskeletal:  Positive for back pain.       Pain in both knee & both shoulders  Skin: Negative.   Allergic/Immunologic: Negative.   Neurological: Negative.   Hematological: Negative.   All other systems reviewed and are negative.      Objective:  Gen: no distress, normal appearing, weight 160 lbs, BMI 25.82 HEENT: oral mucosa pink and moist, NCAT Cardio: Reg rate Chest: normal effort, normal rate of breathing Abd: soft, non-distended Ext: no edema Psych: pleasant, normal affect Skin: intact MSK: ambulating without AD, limited ROM of lumbar and flexion and extension    Assessment & Plan:  1. Chronic Pain of Bilateral Shoulders L>R: Continue HEP as Tolerated. Continue to Monitor.  2. Failed Back Syndrome of Lumbar Syndrome : Continue HEP as Tolerated. Continue current medication regimen. Continue to Monitor. Asked for phone number of [employer] rep and I will call her today to discuss removal of her spinal cord stimulator which is no longer functional.  -discussed plan for removal of her spinal stimulator next week -continue applying deep blue -called Manuela Schwartz [employer] rep to let her know patient would like spinal cord stimulator removed and to ask how I can help with this. She recommended referral to Dr. Davy Pique at [facility]. Discussed that I have  already referred patient there and she has not heard from them. Placed new referral today and Manuela Schwartz will reach out to Dr. Davy Pique as well.  -Appreciate Manuela Schwartz communicating with [facility] that her current spinal cord stimulator is MRI compatible. -refilled oxycodone BID PRN Prescribed robaxin and meloxicam. Discussed risks and benefits.  -Discussed current symptoms of pain and history of pain.  -Refilled oxycodone -Discussed following foods that may reduce pain: 1) Ginger (especially studied for arthritis)- reduce leukotriene production  to decrease inflammation 2) Blueberries- high in phytonutrients that decrease inflammation 3) Salmon- marine omega-3s reduce joint swelling and pain 4) Pumpkin seeds- reduce inflammation 5) dark chocolate- reduces inflammation 6) turmeric- reduces inflammation 7) tart cherries - reduce pain and stiffness 8) extra virgin olive oil - its compound olecanthal helps to block prostaglandins  9) chili peppers- can be eaten or applied topically via capsaicin 10) mint- helpful for headache, muscle aches, joint pain, and itching 11) garlic- reduces inflammation  Link to further information on diet for chronic pain: [email or web address]  -Provided with a pain relief journal and discussed that it contains foods and lifestyle tips to naturally help to improve pain. Discussed that these lifestyle strategies are also very good for health unlike some medications which can have negative side effects. Discussed that the act of keeping a journal can be therapeutic and helpful to realize patterns what helps to trigger and alleviate pain.  l 3. Chronic Lower Back Pain without Sciatica: Continue HEP as Tolerated. Continue to Monitor. Referred to [facility] -texted her exercises she can do to improve spinal alignment and improve pain -discussed gabapentin but she cannot use this due to history of  sleepwalking on this medicine -recommended trying doTerra Deep Blue essential oil -discussed that it is ok for use to provide an early refill of oxycodone this times if she needs due to her increased pain 4. Left Knee Pain: No Complaints today. Continue HEP as Tolerated. Continue to Monitor. Discussed Wobenzymes.  5. Muscle Spasm of Shoulders/ Upper Back : Continue Flexeril. Continue to Monitor. 6. Chronic Pain Syndrome: Refilled: Oxycodone '5mg'$ /325 mg two tablets twice a day #120. We will continue the opioid monitoring program, this consists of regular clinic visits, examinations, urine drug screen, pill counts as well as use of [place] Controlled Substance Reporting system.   7. Constipation:  -continue Linzess as prescribed by GI -Provided list of following foods that help with constipation and highlighted a few: 1) prunes- contain high amounts of fiber.  2) apples- has a form of dietary fiber called pectin that accelerates stool movement and increases beneficial gut bacteria 3) pears- in addition to fiber, also high in fructose and sorbitol which have laxative effect 4) figs- contain an enzyme ficin which helps to speed colonic transit 5) kiwis- contain an enzyme actinidin that improves gut motility and reduces constipation 6) oranges- rich in pectin (like apples) 7) grapefruits- contain a flavanol naringenin which has a laxative effect 8) vegetables- rich in fiber and also great sources of folate, vitamin C, and K 9) artichoke- high in inulin, prebiotic great for the microbiome 10) chicory- increases stool frequency and softness (can be added to coffee) 11) rhubarb- laxative effect 12) sweet potato- high fiber 13) beans, peas, and lentils- contain both soluble and insoluble fiber 14) chia seeds- improves intestinal health and gut flora 15) flaxseeds- laxative effect 16) whole grain rye bread- high in fiber 17) oat bran- high in soluble and insoluble fiber 18) kefir- softens  stools -recommended to try at least one of these foods every day.  -drink 6-8 glasses of water per day -walk regularly, especially after meals.   8) Anxiety: -Continue alprazolam as prescribed by Dr. Moshe Cipro. Appreciate Eunice's counseling regarding increased risk of side effects with use of Alprazolam and Oxycodone.  -Discussed exercise and meditation as tools to decrease anxiety. -Recommended Down Dog Yoga app -Discussed spending time outdoors. -Discussed positive re-framing of anxiety.  -Discussed the following foods that have been show to reduce anxiety:  1) [place] nuts, mushrooms, soy beans due to their high selenium content. Upper limit of toxicity of selenium is 490mg/day so no more than 3-4 bBolivianuts per day.  2) Fatty fish such as salmon, mackerel, sardines, trout, and herring- high in omega-3 fatty acids 3) Eggs- increases serotonin and dopamine 4) Pumpkin seeds- high in omega-3 fatty acids 5) dark chocolate- high in flavanols that increase blood flow to brain 6) turmeric- take with black pepper to increase absorption 7) chamomile tea- antioxidant and anti-inflammatory properties 8) yogurt without sugar- supports gut-brain axis 9) green tea- contains L- theanine 10) blueberries- high in vitamin C and antioxidants 11) t[place] high in tryptophan which gets converted to serotonin 12) bell peppers- rich in vitamin C and antioxidants 13) citrus fruits- rich in vitamin C and antioxidants 14) almonds- high in vitamin E and healthy fats 15) chia seeds- high in omega-3 fatty acids  9) Diabetes type 2 -HgbA1c reviewed- 6.7 -continue Mounjaro, discussed that nausea is a common side effect and usually shortlived.  -check CBGs daily, log, and bring log to follow-up appointment -avoid sugar, bread, pasta, rice -avoid snacking -try to incorporate into your diet some of the following foods which are good for diabetes: 1) cinnamon- imitates effects of insulin, increasing glucose  transport into cells (CWestern Saharaor VGuinea-Bissaucinnamon is best, least processed) 2) nuts- can slow down the blood sugar response of carbohydrate rich foods 3) oatmeal- contains and anti-inflammatory compound avenanthramide 4) whole-milk yogurt (best types are no sugar, GMayotteyogurt, or goat/sheep yogurt) 5) beans- high in protein, fiber, and vitamins, low glycemic index 6) broccoli- great source of vitamin A and C 7) quinoa- higher in protein and fiber than other grains 8) spinach- high in vitamin A, fiber, and protein 9) olive oil- reduces glucose levels, LDL, and triglycerides 10) salmon- excellent amount of omega-3-fatty acids 11) walnuts- rich in antioxidants 12) apples- high in fiber and quercetin 13) carrots- highly nutritious with low impact on blood sugar 14) eggs- improve HDL (good cholesterol), high in protein, keep you satiated 15) turmeric: improves blood sugars, cardiovascular disease, and protects kidney health 16) garlic: improves blood sugar, blood pressure, pain 17) tomatoes: highly nutritious with low impact on blood sugar   10) Obesity: -Educated regarding health benefits of weight loss- for pain, general health, chronic disease prevention, immune health, mental health.  Reviewed current weight is 145 lbs, BMI 23,40, commended on 18 lb weight loss! -prescribed magnesium gluconate '250mg'$  HS -Will monitor weight every visit.  -Consider [place] tea daily.  -Discussed the benefits of intermittent fasting. -Discussed foods that can assist in weight loss: 1) leafy greens- high in fiber and nutrients 2) dark chocolate- improves metabolism (if prefer sweetened, best to sweeten with honey instead of sugar).  3) cruciferous vegetables- high in fiber and protein 4) full fat yogurt: high in healthy fat, protein, calcium, and probiotics 5) apples- high in a variety of phytochemicals 6) nuts- high in fiber and protein that increase feelings of fullness 7) grapefruit: rich in  nutrients, antioxidants, and fiber (not to be taken with anticoagulation) 8) beans- high in protein and fiber 9) salmon- has high quality protein and healthy fats 10) green tea- rich in polyphenols 11) eggs- rich in choline and vitamin D 12) tuna- high protein, boosts metabolism 13) avocado- decreases visceral abdominal fat 14) chicken (pasture raised): high in protein and iron 15) blueberries- reduce abdominal fat and cholesterol 16) whole grains- decreases calories retained during digestion, speeds metabolism 17) chia seeds- English appetite  18) chilies- increases fat metabolism  -Discussed supplements that can be used:  1) Metatrim '400mg'$  BID 30 minutes before breakfast and dinner  2) Sphaeranthus indicus and Garcinia mangostana (combinations of these and #1 can be found in capsicum and zychrome  3) green coffee bean extract '400mg'$  twice per day or Irvingia (african mango) 150 to '300mg'$  twice per day.  11) Left lumbar radiculopathy: -NexWave Zynex IFS ordered -Provided with a pain relief journal and discussed that it contains foods and lifestyle tips to naturally help to improve pain. Discussed that these lifestyle strategies are also very good for health unlike some medications which can have negative side effects. Discussed that the act of keeping a journal can be therapeutic and helpful to realize patterns what helps to trigger and alleviate pain.   -MRI ordered of lumbar spine, discussed suspected diagnosis of lumbar radiculopathy given lower back pain radiating into lower extremity. Unfortunately, looks like she will be unable to get MRI due to her spinal cord stimulator. Referred to [place] for removal of spinal cord stimulator. CT lumbar spine ordered Stat at [facility] given new neurological deficits of decreased sensation. Discussed going to ED if neurological symptoms worsen -continue amitriptyline '10mg'$  HS, discussed that this could potentially help her sleep better at night without  having to take a percocet at this time -discontinued meloxicam since not helping much and has several potential side effects. -reviewed the results of her CT lumbar spine.  Discussed that there are no significant concerning findings.  -discussed I will call her today once Mri results are available.  -f/u with Dr. Gean Quint about removal of the spinal cord stimulator, discussed with [employer] rep trying to expedite this removal as this is very important to her -refilled her oxycodone -called Dr. Keene Breath office at [facility] to see if we can get records sent to Dr. Tretha Sciara office -Hopefully getting MRI will be helpful to Dr. Gean Quint for spinal cord stimulator removal well.   12) Nausea Resolved  13) Right knee OA -continue viscosupplementation as needed -Discussed current symptoms of pain and history of pain.  -Discussed benefits of exercise in reducing pain. -Discussed following foods that may reduce pain: 1) Ginger (especially studied for arthritis)- reduce leukotriene production to decrease inflammation 2) Blueberries- high in phytonutrients that decrease inflammation 3) Salmon- marine omega-3s reduce joint swelling and pain 4) Pumpkin seeds- reduce inflammation 5) dark chocolate- reduces inflammation 6) turmeric- reduces inflammation 7) tart cherries - reduce pain and stiffness 8) extra virgin olive oil - its compound olecanthal helps to block prostaglandins  9) chili peppers- can be eaten or applied topically via capsaicin 10) mint- helpful for headache, muscle aches, joint pain, and itching 11) garlic- reduces inflammation  Link to further information on diet for chronic pain: http://www.randall.com/    14) Insomnia: -Try to go outside near sunrise -Get exercise during the day.  -Turn off all devices an hour before bedtime.  -Teas that can benefit: chamomile, valerian root, Brahmi (Bacopa) -Can  consider over the counter melatonin, magnesium, and/or L-theanine. Melatonin is an anti-oxidant with multiple health benefits. Magnesium is involved in greater than 300 enzymatic reactions in the body and most of [place] are deficient as our soil is often depleted. There are 7 different types of magnesium- Bioptemizer's is a supplement with all 7 types, and each has unique benefits. Magnesium can also help with constipation and anxiety.  -Pistachios naturally increase the production of melatonin -Cozy Earth bamboo bed sheets are free from toxic chemicals.  -  Tart cherry juice or a tart cherry supplement can improve sleep and soreness post-workout      -continue amitriptyline.   15) History of COVID infection -discussed her infection and that she is feeling better now -discussed her continued loss of smell.   16) PTSD: -discussed her history of personal trauma and trauma to her daughter -continue f/u with psycholoy

## 2022-12-09 NOTE — Patient Instructions (Signed)
Insomnia: -Try to go outside near sunrise -Get exercise during the day.  -Turn off all devices an hour before bedtime.  -Teas that can benefit: chamomile, valerian root, Brahmi (Bacopa) -Can consider over the counter melatonin, magnesium, and/or L-theanine. Melatonin is an anti-oxidant with multiple health benefits. Magnesium is involved in greater than 300 enzymatic reactions in the body and most of us are deficient as our soil is often depleted. There are 7 different types of magnesium- Bioptemizer's is a supplement with all 7 types, and each has unique benefits. Magnesium can also help with constipation and anxiety.  -Pistachios naturally increase the production of melatonin -Cozy Earth bamboo bed sheets are free from toxic chemicals.  -Tart cherry juice or a tart cherry supplement can improve sleep and soreness post-workout  Foods that may reduce pain: 1) Ginger (especially studied for arthritis)- reduce leukotriene production to decrease inflammation 2) Blueberries- high in phytonutrients that decrease inflammation 3) Salmon- marine omega-3s reduce joint swelling and pain 4) Pumpkin seeds- reduce inflammation 5) dark chocolate- reduces inflammation 6) turmeric- reduces inflammation 7) tart cherries - reduce pain and stiffness 8) extra virgin olive oil - its compound olecanthal helps to block prostaglandins  9) chili peppers- can be eaten or applied topically via capsaicin 10) mint- helpful for headache, muscle aches, joint pain, and itching 11) garlic- reduces inflammation  Link to further information on diet for chronic pain: https://www.[email or web address].com/treatments/[email or web address]/diet-patients-[email or web address]-pain  

## 2022-12-13 DIAGNOSIS — T85112A Breakdown (mechanical) of implanted electronic neurostimulator (electrode) of spinal cord, initial encounter: Secondary | ICD-10-CM | POA: Diagnosis not present

## 2022-12-13 DIAGNOSIS — T85193A Other mechanical complication of implanted electronic neurostimulator, generator, initial encounter: Secondary | ICD-10-CM | POA: Diagnosis not present

## 2022-12-13 DIAGNOSIS — T85192A Other mechanical complication of implanted electronic neurostimulator (electrode) of spinal cord, initial encounter: Secondary | ICD-10-CM | POA: Diagnosis not present

## 2022-12-13 HISTORY — PX: OTHER SURGICAL HISTORY: SHX169

## 2022-12-14 ENCOUNTER — Other Ambulatory Visit: Payer: Self-pay | Admitting: Family Medicine

## 2022-12-20 DIAGNOSIS — M961 Postlaminectomy syndrome, not elsewhere classified: Secondary | ICD-10-CM | POA: Diagnosis not present

## 2022-12-30 DIAGNOSIS — M961 Postlaminectomy syndrome, not elsewhere classified: Secondary | ICD-10-CM | POA: Diagnosis not present

## 2022-12-31 ENCOUNTER — Encounter: Payer: Self-pay | Admitting: Internal Medicine

## 2022-12-31 ENCOUNTER — Ambulatory Visit (INDEPENDENT_AMBULATORY_CARE_PROVIDER_SITE_OTHER): Payer: Medicare HMO | Admitting: Internal Medicine

## 2022-12-31 VITALS — BP 118/78 | HR 91 | Ht 66.0 in | Wt 163.5 lb

## 2022-12-31 DIAGNOSIS — K581 Irritable bowel syndrome with constipation: Secondary | ICD-10-CM

## 2022-12-31 DIAGNOSIS — K219 Gastro-esophageal reflux disease without esophagitis: Secondary | ICD-10-CM

## 2022-12-31 MED ORDER — LANSOPRAZOLE 30 MG PO CPDR: 30.0000 mg | DELAYED_RELEASE_CAPSULE | Freq: Every day | ORAL | 3 refills | Status: DC

## 2022-12-31 NOTE — Patient Instructions (Signed)
Continue Linzess and Amitiza has directed.   Stop Pantoprazole.   Start Lansoprazole   We have sent the following medications to your pharmacy for you to pick up at your convenience: Lansoprazole   _______________________________________________________  If your blood pressure at your visit was 140/90 or greater, please contact your primary care physician to follow up on this.  _______________________________________________________  If you are age 54 or older, your body mass index should be between 23-30. Your Body mass index is 26.39 kg/m. If this is out of the aforementioned range listed, please consider follow up with your Primary Care Provider.  If you are age 70 or younger, your body mass index should be between 19-25. Your Body mass index is 26.39 kg/m. If this is out of the aformentioned range listed, please consider follow up with your Primary Care Provider.   ________________________________________________________  The [place] [facility] providers would like to encourage you to use [facility] to communicate with providers for non-urgent requests or questions.  Due to long hold times on the telephone, sending your provider a message by [facility] may be a faster and more efficient way to get a response.  Please allow 48 business hours for a response.  Please remember that this is for non-urgent requests.  _______________________________________________________  Thank you for choosing me and [facility].  Dr.Jay Pyrtle

## 2022-12-31 NOTE — Progress Notes (Signed)
Subjective:    Patient ID: Jane English, female    DOB: September 09, 1971, 52 y.o.   MRN: [id number]  HPI Sabriya Nora is a 52 year old female with history of chronic constipation associated with IBS, GERD, previously treated H. pylori, history of abdominal adhesive disease, chronic pain, hypertension and bipolar depression who is here for follow-up.  She was last seen for colonoscopy in December 2022 she presents alone today.  She came for colonoscopy in December 2022 and had 3 small adenomatous polyps removed.  Since she was here she has been started on [facility] and work the dose up to 7.5.  With this she is lost nearly 50 pounds.  It has had the expected mild early satiety and decreased appetite.  If she overeats particularly sweets she will feel poorly for several hours.  She has been happy with the response.  Her Amitiza was not covered by insurance and without this she was having constipation with abdominal bloating and discomfort.  So she is now back on Amitiza 24 mcg twice daily and Linzess 290 mcg daily.  With this she is having regular once daily bowel movement without pain and much less bloating.  Her reflux particularly heartburn has been slightly worse with some breakthrough symptoms despite pantoprazole 40 mg daily.  No dysphagia or odynophagia    Review of Systems As per HPI, otherwise negative  Current Medications, Allergies, Past Medical History, Past Surgical History, Family History and Social History were reviewed in [employer] record.    Objective:   Physical Exam BP 118/78   Pulse 91   Ht '5\' 6"'$  (1.676 m)   Wt 163 lb 8 oz (74.2 kg)   SpO2 98%   BMI 26.39 kg/m  Gen: awake, alert, NAD HEENT: anicteric  Abd: soft, NT/ND, +BS throughout Ext: no c/c/e Neuro: nonfocal     Latest Ref Rng & Units 06/05/2022    8:45 PM 03/21/2022   12:13 PM 05/14/2021    3:47 PM  CBC  WBC 4.0 - 10.5 K/uL 7.7  5.9  6.2   Hemoglobin 12.0 - 15.0 g/dL 13.2  13.1  13.4    Hematocrit 36.0 - 46.0 % 38.9  37.7  38.3   Platelets 150 - 400 K/uL 265  275  298    CMP     Component Value Date/Time   NA 140 09/23/2022 0836   K 3.5 09/23/2022 0836   CL 103 09/23/2022 0836   CO2 23 09/23/2022 0836   GLUCOSE 86 09/23/2022 0836   GLUCOSE 78 06/05/2022 2045   BUN 15 09/23/2022 0836   CREATININE 1.06 (H) 09/23/2022 0836   CREATININE 1.03 12/06/2019 1508   CALCIUM 9.1 09/23/2022 0836   PROT 7.2 09/23/2022 0836   ALBUMIN 4.5 09/23/2022 0836   AST 19 09/23/2022 0836   ALT 28 09/23/2022 0836   ALKPHOS 86 09/23/2022 0836   BILITOT 0.3 09/23/2022 0836   GFRNONAA >60 06/05/2022 2045   GFRNONAA 63 12/06/2019 1508   GFRAA 71 04/06/2020 1630   GFRAA 73 12/06/2019 1508         Assessment & Plan:  52 year old female with history of chronic constipation associated with IBS, GERD, previously treated H. pylori, history of abdominal adhesive disease, chronic pain, hypertension and bipolar depression who is here for follow-up.  Chronic constipation/IBS --she really has required both Amitiza and Linzess for treatment and response.  Fortunately both medicines are now approved for daily use. -- Linzess 290 mcg daily -- Amitiza 24  mcg twice daily  2.  GERD/dyspepsia --previously controlled on pantoprazole once daily.  Mild increase in symptoms query if this is GLP-1 related.  Will try a different PPI to see if this is more effective.  If not we can go back to pantoprazole but increase to twice daily -- Discontinue pantoprazole -- Begin lansoprazole 30 mg daily  3.  History of colon polyps --surveillance colonoscopy recommended December 2027  Annual follow-up, sooner if needed

## 2023-01-06 ENCOUNTER — Encounter: Payer: Medicare HMO | Attending: Physical Medicine and Rehabilitation | Admitting: Physical Medicine and Rehabilitation

## 2023-01-06 VITALS — BP 122/69 | HR 87 | Ht 66.0 in | Wt 165.6 lb

## 2023-01-06 DIAGNOSIS — Z8639 Personal history of other endocrine, nutritional and metabolic disease: Secondary | ICD-10-CM | POA: Diagnosis not present

## 2023-01-06 DIAGNOSIS — M961 Postlaminectomy syndrome, not elsewhere classified: Secondary | ICD-10-CM | POA: Insufficient documentation

## 2023-01-06 MED ORDER — OXYCODONE HCL 5 MG PO TABS: 10.0000 mg | ORAL_TABLET | Freq: Two times a day (BID) | ORAL | 0 refills | Status: DC

## 2023-01-06 NOTE — Progress Notes (Signed)
Subjective:    Patient ID: Jane English, female    DOB: [birth year], 52 y.o.   MRN: [id number]  HPI Mrs. Millis is a 52 year old woman who presents for follow-up of failed back syndrome and history of obesity, now overweight  1) Overweight, history of obesity -has gained some weight since last visit  2) Failed back syndrome: -had stimulator removed and site has been hurting since then -stents removed as well.  -going back to see him on the 13th -no signs of infection -staples removed.  -returning to work today -needs refill of oxycodone  Pain Inventory Average Pain 9 Pain Right Now 6 My pain is intermittent, constant, sharp, burning, dull, stabbing, tingling, and aching  In the last 24 hours, has pain interfered with the following? General activity 8 Relation with others 8 Enjoyment of life 8 What TIME of day is your pain at its worst? morning , evening, and night Sleep (in general) Fair  Pain is worse with: walking, bending, sitting, inactivity, standing, and some activites Pain improves with: rest, heat/ice, and medication Relief from Meds: 8  Family History  Problem Relation Age of Onset   Physical abuse Mother    Alcohol abuse Mother    Cirrhosis Mother    Lung cancer Father    Stomach cancer Father    Esophageal cancer Father    Alcohol abuse Father    Mental illness Father    Diabetes Sister    Hypertension Sister    Bipolar disorder Sister    Schizophrenia Sister    Diabetes Sister    Drug abuse Sister    HIV Sister    Pneumonia Sister        died as a baby   Alcohol abuse Brother    Hypertension Brother    Kidney disease Brother    Diabetes Brother    Drug abuse Brother    Mental illness Brother    Alcohol abuse Brother    Alcohol abuse Brother    Hypertension Brother    Diabetes Brother    Alcohol abuse Brother    Mental illness Brother        in [place]   Alcohol abuse Brother    Alcohol abuse Brother    Bipolar disorder Brother     Hypertension Brother    Bipolar disorder Brother    Drug abuse Brother    Alcohol abuse Brother    Bipolar disorder Brother    Bipolar disorder Daughter    Bipolar disorder Son    Bipolar disorder Son    ADD / ADHD Neg Hx    Anxiety disorder Neg Hx    Dementia Neg Hx    Depression Neg Hx    OCD Neg Hx    Seizures Neg Hx    Paranoid behavior Neg Hx    Colon cancer Neg Hx    Rectal cancer Neg Hx    Social History   Socioeconomic History   Marital status: Married    Spouse name: Herbie [place]   Number of children: 4   Years of education: 12   Highest education level: Some college, no degree  Occupational History   Occupation: disabled  Tobacco Use   Smoking status: Never   Smokeless tobacco: Never  Vaping Use   Vaping Use: Never used  Substance and Sexual Activity   Alcohol use: No   Drug use: No   Sexual activity: Not Currently    Partners: Male    Birth control/protection:  Surgical    Comment: tubal  Other Topics Concern   Not on file  Social History Narrative   Not on file   Social Determinants of Health   Financial Resource Strain: Low Risk  (11/08/2021)   Overall Financial Resource Strain (CARDIA)    Difficulty of Paying Living Expenses: Not hard at all  Food Insecurity: No Food Insecurity (11/08/2021)   Hunger Vital Sign    Worried About Running Out of Food in the Last Year: Never true    Ran Out of Food in the Last Year: Never true  Transportation Needs: No Transportation Needs (11/08/2021)   PRAPARE - Hydrologist (Medical): No    Lack of Transportation (Non-Medical): No  Physical Activity: Insufficiently Active (11/08/2021)   Exercise Vital Sign    Days of Exercise per Week: 3 days    Minutes of Exercise per Session: 30 min  Stress: No Stress Concern Present (11/08/2021)   [place]    Feeling of Stress : Only a little  Social Connections: Moderately Isolated (11/08/2021)    Social Connection and Isolation Panel [NHANES]    Frequency of Communication with Friends and Family: More than three times a week    Frequency of Social Gatherings with Friends and Family: Once a week    Attends Religious Services: Never    Marine scientist or Organizations: No    Attends Music therapist: Never    Marital Status: Married   Past Surgical History:  Procedure Laterality Date   ANTERIOR CERVICAL DECOMP/DISCECTOMY FUSION  07/07/2012   Procedure: ANTERIOR CERVICAL DECOMPRESSION/DISCECTOMY FUSION 2 LEVELS;  Surgeon: Floyce Stakes, MD;  Location: [facility];  Service: Neurosurgery;  Laterality: N/A;  Cervical four-five, five - six  Anterior cervical decompression/diskectomy/fusion/plate   APPENDECTOMY  1986   BOWEL RESECTION N/A 07/29/2013   Procedure: serosal repair;  Surgeon: Adin Hector, MD;  Location: [facility];  Service: General;  Laterality: N/A;   CARPAL TUNNEL RELEASE Bilateral    COLON SURGERY N/A    Phreesia 07/29/2020   LAPAROSCOPY N/A 07/29/2013   Procedure: diagnostic laporoscopy;  Surgeon: Adin Hector, MD;  Location: [facility];  Service: General;  Laterality: N/A;   LAPAROSCOPY N/A 08/16/2013   Procedure: LAPAROSCOPY DIAGNOSTIC/LYSIS OF ADHESIONS;  Surgeon: Adin Hector, MD;  Location: [facility];  Service: General;  Laterality: N/A;   LAPAROTOMY N/A 08/16/2013   Procedure: EXPLORATORY LAPAROTOMY/SMALL BOWEL RESECTION (JEJUNUM);  Surgeon: Adin Hector, MD;  Location: [facility];  Service: General;  Laterality: N/A;   LUMBAR SPINE SURGERY  2010   x 3   LYSIS OF ADHESION  2003   Dr. Irving Shows   LYSIS OF ADHESION N/A 07/29/2013   Procedure: LYSIS OF ADHESION;  Surgeon: Adin Hector, MD;  Location: [facility];  Service: General;  Laterality: N/A;   [place]?   [place], [place]   SPINAL CORD STIMULATOR IMPLANT     SPINE SURGERY N/A    Phreesia 07/29/2020   TRIGGER FINGER RELEASE  2009   right pinkie  finger   TUBAL LIGATION  1994   Past Surgical History:  Procedure Laterality Date   ANTERIOR CERVICAL DECOMP/DISCECTOMY FUSION  07/07/2012   Procedure: ANTERIOR CERVICAL DECOMPRESSION/DISCECTOMY FUSION 2 LEVELS;  Surgeon: Floyce Stakes, MD;  Location: [facility];  Service: Neurosurgery;  Laterality: N/A;  Cervical four-five, five - six  Anterior cervical decompression/diskectomy/fusion/plate   APPENDECTOMY  1986   BOWEL RESECTION N/A 07/29/2013   Procedure: serosal repair;  Surgeon: Adin Hector, MD;  Location: [facility];  Service: General;  Laterality: N/A;   CARPAL TUNNEL RELEASE Bilateral    COLON SURGERY N/A    Phreesia 07/29/2020   LAPAROSCOPY N/A 07/29/2013   Procedure: diagnostic laporoscopy;  Surgeon: Adin Hector, MD;  Location: [facility];  Service: General;  Laterality: N/A;   LAPAROSCOPY N/A 08/16/2013   Procedure: LAPAROSCOPY DIAGNOSTIC/LYSIS OF ADHESIONS;  Surgeon: Adin Hector, MD;  Location[facility];  Service: General;  Laterality: N/A;   LAPAROTOMY N/A 08/16/2013   Procedure: EXPLORATORY LAPAROTOMY/SMALL BOWEL RESECTION (JEJUNUM);  Surgeon: Adin Hector, MD;  [facility];  Service: General;  Laterality: N/A;   LUMBAR SPINE SURGERY  2010   x 3   LYSIS OF ADHESION  2003   Dr. Irving Shows   LYSIS OF ADHESION N/A 07/29/2013   Procedure: LYSIS OF ADHESION;  Surgeon: Adin Hector, MD;  [facility]: [facility];  Service: General;  Laterality: N/A;   [place]?   [place], [place]   SPINAL CORD STIMULATOR IMPLANT     SPINE SURGERY N/A    Phreesia 07/29/2020   TRIGGER FINGER RELEASE  2009   right pinkie finger   TUBAL LIGATION  1994   Past Medical History:  Diagnosis Date   Anemia    Asthma    Asthma flare 04/09/2013   Back pain    Bronchitis    Chronic abdominal pain    Chronic constipation    Constipation due to opioid therapy    Depression    Depression, major, single episode, severe ([place]) 10/03/2018   PHQ 9 score of 15    Diabetes mellitus without complication ([place])    Diabetes mellitus, type II ([place])    DVT (deep venous thrombosis) ([place]) 2010   GERD (gastroesophageal reflux disease)    Heart murmur    no cardiologist   Helicobacter pylori gastritis 06/11/2013   Colonoscopy Dr. Hilarie Fredrickson   Hypertension    IBS (irritable bowel syndrome)    Migraine headache    Neuropathy    Obesity    Obsessive-compulsive disorder    PSYCHOTIC D/O W/HALLUCINATIONS CONDS CLASS ELSW 03/04/2010   Qualifier: Diagnosis of  By: Moshe Cipro MD, Margaret     PTSD (post-traumatic stress disorder)    SBO (small bowel obstruction) ([place]) 08/09/2013   Seasonal allergies 12/10/2012   Seizures ([facility])    Shortness of breath    BP 122/69   Pulse 87   Ht '5\' 6"'$  (1.676 m)   Wt 165 lb 9.6 oz (75.1 kg)   SpO2 97%   BMI 26.73 kg/m   Opioid Risk Score:   Fall Risk Score:  `1  Depression screen [facility] 2/9     11/11/2022    3:17 PM 10/15/2022   10:05 AM 09/20/2022    9:05 AM 09/06/2022    9:45 AM 08/06/2022   10:01 AM 06/20/2022    9:05 AM 05/30/2022   11:19 AM  Depression screen PHQ 2/9  Decreased Interest 1 0 0 0 0 0 0  Down, Depressed, Hopeless 1 0 0 0 0 0 0  PHQ - 2 Score 2 0 0 0 0 0 0     Review of Systems  Musculoskeletal:        Bilateral shoulder pain Left hip pain Left knee pain  All other systems  reviewed and are negative.     Objective:   Physical Exam Gen: no distress, normal appearing, BMI 26.73 HEENT: oral mucosa pink and moist, NCAT Cardio: Reg rate Chest: normal effort, normal rate of breathing Abd: soft, non-distended Ext: no edema Psych: pleasant, normal affect Skin: intact Neuro: Alert and oriented x3, ambulating normally        Assessment & Plan:   1) Failed back syndrome -refilled oxycodone -encouraged follow-up with surgeon regarding pain after spinal cord stimulator removal -Provided with a pain relief journal and discussed that it contains foods and lifestyle tips to naturally help to improve pain.  Discussed that these lifestyle strategies are also very good for health unlike some medications which can have negative side effects. Discussed that the act of keeping a journal can be therapeutic and helpful to realize patterns what helps to trigger and alleviate pain.  . -Discussed following foods that may reduce pain: 1) Ginger (especially studied for arthritis)- reduce leukotriene production to decrease inflammation 2) Blueberries- high in phytonutrients that decrease inflammation 3) Salmon- marine omega-3s reduce joint swelling and pain 4) Pumpkin seeds- reduce inflammation 5) dark chocolate- reduces inflammation 6) turmeric- reduces inflammation 7) tart cherries - reduce pain and stiffness 8) extra virgin olive oil - its compound olecanthal helps to block prostaglandins  9) chili peppers- can be eaten or applied topically via capsaicin 10) mint- helpful for headache, muscle aches, joint pain, and itching 11) garlic- reduces inflammation  Link to further information on diet for chronic pain: http://www.randall.com/    2) Overweight, history of obesity -discussed slight weight increase -provided dietary education -continue [employer]

## 2023-01-08 ENCOUNTER — Telehealth: Payer: Self-pay | Admitting: Internal Medicine

## 2023-01-08 MED ORDER — NA SULFATE-K SULFATE-MG SULF 17.5-3.13-1.6 GM/177ML PO SOLN: ORAL | 0 refills | Status: AC

## 2023-01-08 NOTE — Telephone Encounter (Signed)
Would bowel purge with suprep or plenvu to see if this resets things a bit and allows the laxative combination to work better Non-split prep

## 2023-01-08 NOTE — Telephone Encounter (Signed)
Pt called and states she is taking linzess in the pm, amitiza in the am and miralax also. Reports she tried an enema and it did not help. States she put a glove on and was able to manually remove some stool but she started bleeding and it was painful. Pt wants to know what Dr. Hilarie Fredrickson recommends.Please advise.

## 2023-01-08 NOTE — Telephone Encounter (Signed)
Spoke with pt and she is aware. Script sent to pharmacy. 

## 2023-01-08 NOTE — Telephone Encounter (Signed)
Inbound call from patient is requesting an appointment ASAP with Dr. Hilarie Fredrickson, stating the medication prescribed at her last appointment is not improving her symptoms and she is experiencing severe constipation. Please advise.

## 2023-01-15 DIAGNOSIS — T85192A Other mechanical complication of implanted electronic neurostimulator (electrode) of spinal cord, initial encounter: Secondary | ICD-10-CM | POA: Diagnosis not present

## 2023-01-18 DIAGNOSIS — M961 Postlaminectomy syndrome, not elsewhere classified: Secondary | ICD-10-CM | POA: Diagnosis not present

## 2023-01-21 ENCOUNTER — Encounter: Payer: Self-pay | Admitting: Family Medicine

## 2023-01-21 ENCOUNTER — Ambulatory Visit (INDEPENDENT_AMBULATORY_CARE_PROVIDER_SITE_OTHER): Payer: Medicare HMO | Admitting: Family Medicine

## 2023-01-21 VITALS — BP 104/69 | HR 94 | Ht 66.0 in | Wt 161.1 lb

## 2023-01-21 DIAGNOSIS — Z1231 Encounter for screening mammogram for malignant neoplasm of breast: Secondary | ICD-10-CM

## 2023-01-21 DIAGNOSIS — J3089 Other allergic rhinitis: Secondary | ICD-10-CM

## 2023-01-21 DIAGNOSIS — G894 Chronic pain syndrome: Secondary | ICD-10-CM | POA: Diagnosis not present

## 2023-01-21 DIAGNOSIS — J45991 Cough variant asthma: Secondary | ICD-10-CM | POA: Diagnosis not present

## 2023-01-21 DIAGNOSIS — E559 Vitamin D deficiency, unspecified: Secondary | ICD-10-CM | POA: Diagnosis not present

## 2023-01-21 DIAGNOSIS — R3915 Urgency of urination: Secondary | ICD-10-CM

## 2023-01-21 DIAGNOSIS — H1013 Acute atopic conjunctivitis, bilateral: Secondary | ICD-10-CM

## 2023-01-21 DIAGNOSIS — Z13220 Encounter for screening for lipoid disorders: Secondary | ICD-10-CM | POA: Diagnosis not present

## 2023-01-21 DIAGNOSIS — E1169 Type 2 diabetes mellitus with other specified complication: Secondary | ICD-10-CM

## 2023-01-21 DIAGNOSIS — K219 Gastro-esophageal reflux disease without esophagitis: Secondary | ICD-10-CM | POA: Diagnosis not present

## 2023-01-21 DIAGNOSIS — E663 Overweight: Secondary | ICD-10-CM

## 2023-01-21 DIAGNOSIS — I10 Essential (primary) hypertension: Secondary | ICD-10-CM | POA: Diagnosis not present

## 2023-01-21 MED ORDER — CROMOLYN SODIUM 4 % OP SOLN: 1.0000 [drp] | Freq: Four times a day (QID) | OPHTHALMIC | 12 refills | Status: DC

## 2023-01-21 NOTE — Progress Notes (Unsigned)
   Jane English     MRN: [id number]      DOB: [birth year]   HPI Jane English is here for follow up and re-evaluation of chronic medical conditions, medication management and review of any available recent lab and radiology data.  Preventive health is updated, specifically  Cancer screening and Immunization.   Questions or concerns regarding consultations or procedures which the PT has had in the interim are  addressed. The PT denies any adverse reactions to current medications since the last visit.  There are no new concerns.  There are no specific complaints   ROS Denies recent fever or chills. Denies sinus pressure, nasal congestion, ear pain or sore throat. Denies chest congestion, productive cough or wheezing. Denies chest pains, palpitations and leg swelling Denies abdominal pain, nausea, vomiting,diarrhea or constipation.   Denies dysuria, frequency, hesitancy or incontinence. Denies joint pain, swelling and limitation in mobility. Denies headaches, seizures, numbness, or tingling. Denies depression, anxiety or insomnia. Denies skin break down or rash.   PE  BP 104/69 (BP Location: Right Arm, Patient Position: Sitting, Cuff Size: Normal)   Pulse 94   Ht 5\' 6"  (1.676 m)   Wt 161 lb 1.9 oz (73.1 kg)   SpO2 96%   BMI 26.01 kg/m   Patient alert and oriented and in no cardiopulmonary distress.  HEENT: No facial asymmetry, EOMI,     Neck supple .  Chest: Clear to auscultation bilaterally.  CVS: S1, S2 no murmurs, no S3.Regular rate.  ABD: Soft non tender.   Ext: No edema  MS: Adequate ROM spine, shoulders, hips and knees.  Skin: Intact, no ulcerations or rash noted.  Psych: Good eye contact, normal affect. Memory intact not anxious or depressed appearing.  CNS: CN 2-12 intact, power,  normal throughout.no focal deficits noted.   Assessment & Plan  No problem-specific Assessment & Plan notes found for this encounter.

## 2023-01-21 NOTE — Patient Instructions (Signed)
Annual with pap in 4 months, call if you need me sooner  Please schedule mammogram at checkout  Fasting lipid, cmp and EGFr, hBA1c, TSH, CBC, vit d 3 to 5 days before next visit   It is important that you exercise regularly at least 30 minutes 5 times a week. If you develop chest pain, have severe difficulty breathing, or feel very tired, stop exercising immediately and seek medical attention   Think about what you will eat, plan ahead. Choose " clean, green, fresh or frozen" over canned, processed or packaged foods which are more sugary, salty and fatty. 70 to 75% of food eaten should be vegetables and fruit. Three meals at set times with snacks allowed between meals, but they must be fruit or vegetables. Aim to eat over a 12 hour period , example 7 am to 7 pm, and STOP after  your last meal of the day. Drink water,generally about 64 ounces per day, no other drink is as healthy. Fruit juice is best enjoyed in a healthy way, by EATING the fruit.  Thanks for choosing [facility], we consider it a privelige to serve you.

## 2023-01-22 NOTE — Assessment & Plan Note (Signed)
  Patient re-educated about  the importance of commitment to a  minimum of 150 minutes of exercise per week as able.  The importance of healthy food choices with portion control discussed, as well as eating regularly and within a 12 hour window most days. The need to choose "clean , green" food 50 to 75% of the time is discussed, as well as to make water the primary drink and set a goal of 64 ounces water daily.       01/21/2023    9:17 AM 01/06/2023    9:26 AM 12/31/2022   11:14 AM  Weight /BMI  Weight 161 lb 1.9 oz 165 lb 9.6 oz 163 lb 8 oz  Height 5\' 6"  (1.676 m) 5\' 6"  (1.676 m) 5\' 6"  (1.676 m)  BMI 26.01 kg/m2 26.73 kg/m2 26.39 kg/m2

## 2023-01-22 NOTE — Assessment & Plan Note (Signed)
Labon day of visit good control, no med change

## 2023-01-22 NOTE — Assessment & Plan Note (Signed)
Managed by pain clinic 

## 2023-01-22 NOTE — Assessment & Plan Note (Signed)
Mnaged by GI and controlled

## 2023-01-22 NOTE — Assessment & Plan Note (Signed)
Controlled, no change in medication DASH diet and commitment to daily physical activity for a minimum of 30 minutes discussed and encouraged, as a part of hypertension management. The importance of attaining a healthy weight is also discussed.     01/21/2023    9:17 AM 01/06/2023    9:26 AM 12/31/2022   11:14 AM 12/09/2022   10:44 AM 11/11/2022    3:13 PM 11/07/2022   10:03 AM 10/15/2022   10:01 AM  BP/Weight  Systolic BP 123456 [id number] 123456 123456 [id number]  Diastolic BP 69 69 78 70 84 75 65  Wt. (Lbs) 161.12 165.6 163.5 160 164 162 145  BMI 26.01 kg/m2 26.73 kg/m2 26.39 kg/m2 25.82 kg/m2 26.47 kg/m2 26.15 kg/m2 23.4 kg/m2

## 2023-01-22 NOTE — Assessment & Plan Note (Signed)
Controlled, no change in medication  

## 2023-01-22 NOTE — Assessment & Plan Note (Signed)
Ms. Jane English is reminded of the importance of commitment to daily physical activity for 30 minutes or more, as able and the need to limit carbohydrate intake to 30 to 60 grams per meal to help with blood sugar control.   The need to take medication as prescribed, test blood sugar as directed, and to call between visits if there is a concern that blood sugar is uncontrolled is also discussed.   Jane English is reminded of the importance of daily foot exam, annual eye examination, and good blood sugar, blood pressure and cholesterol control.     Latest Ref Rng & Units 09/23/2022    8:36 AM 09/20/2022    9:53 AM 06/05/2022    8:45 PM 03/21/2022   12:13 PM 05/14/2021    3:47 PM  Diabetic Labs  HbA1c 4.8 - 5.6 % 5.9    6.7  6.5   Micro/Creat Ratio 0 - 29 mg/g creat  3      Chol 100 - 199 mg/dL 165    172  187   HDL >39 mg/dL 58    50  53   Calc LDL 0 - 99 mg/dL 88    94  104   Triglycerides 0 - 149 mg/dL 108    165  172   Creatinine 0.57 - 1.00 mg/dL 1.06   1.06  1.04  0.95       01/21/2023    9:17 AM 01/06/2023    9:26 AM 12/31/2022   11:14 AM 12/09/2022   10:44 AM 11/11/2022    3:13 PM 11/07/2022   10:03 AM 10/15/2022   10:01 AM  BP/Weight  Systolic BP 123456 [id number] 123456 123456 [id number]  Diastolic BP 69 69 78 70 84 75 65  Wt. (Lbs) 161.12 165.6 163.5 160 164 162 145  BMI 26.01 kg/m2 26.73 kg/m2 26.39 kg/m2 25.82 kg/m2 26.47 kg/m2 26.15 kg/m2 23.4 kg/m2      Latest Ref Rng & Units 08/15/2022   12:00 AM 03/21/2022   10:40 AM  Foot/eye exam completion dates  Eye Exam No Retinopathy No Retinopathy       Foot Form Completion   Done     This result is from an external source.

## 2023-01-22 NOTE — Assessment & Plan Note (Signed)
Controlled on medication currently taking

## 2023-01-28 ENCOUNTER — Telehealth: Payer: Self-pay | Admitting: Family Medicine

## 2023-01-28 DIAGNOSIS — M961 Postlaminectomy syndrome, not elsewhere classified: Secondary | ICD-10-CM | POA: Diagnosis not present

## 2023-01-28 NOTE — Telephone Encounter (Signed)
noted 

## 2023-01-28 NOTE — Telephone Encounter (Signed)
Pt called stating that she is having cough & mucus. Wants to know if she can get something for this?

## 2023-01-29 ENCOUNTER — Telehealth (INDEPENDENT_AMBULATORY_CARE_PROVIDER_SITE_OTHER): Payer: Medicare HMO | Admitting: Internal Medicine

## 2023-01-29 ENCOUNTER — Encounter: Payer: Self-pay | Admitting: Internal Medicine

## 2023-01-29 ENCOUNTER — Other Ambulatory Visit: Payer: Self-pay | Admitting: Family Medicine

## 2023-01-29 DIAGNOSIS — J45991 Cough variant asthma: Secondary | ICD-10-CM | POA: Diagnosis not present

## 2023-01-29 DIAGNOSIS — J209 Acute bronchitis, unspecified: Secondary | ICD-10-CM

## 2023-01-29 MED ORDER — PROMETHAZINE-DM 6.25-15 MG/5ML PO SYRP: ORAL_SOLUTION | ORAL | 0 refills | Status: DC

## 2023-01-29 MED ORDER — METHYLPREDNISOLONE 4 MG PO TBPK: ORAL_TABLET | ORAL | 0 refills | Status: DC

## 2023-01-29 MED ORDER — AZITHROMYCIN 250 MG PO TABS: ORAL_TABLET | ORAL | 0 refills | Status: AC

## 2023-01-29 MED ORDER — BENZONATATE 200 MG PO CAPS: 200.0000 mg | ORAL_CAPSULE | Freq: Two times a day (BID) | ORAL | 0 refills | Status: DC | PRN

## 2023-01-29 NOTE — Progress Notes (Signed)
Virtual Visit via Video Note   Because of Jane English's co-morbid illnesses, she is at least at moderate risk for complications without adequate follow up.  This format is felt to be most appropriate for this patient at this time.  All issues noted in this document were discussed and addressed.  A limited physical exam was performed with this format.      Evaluation Performed:  Follow-up visit  Date:  01/29/2023   ID:  Jane English, DOB Mar 13, 1971, MRN [id number]  Patient Location: Home Provider Location: Office/Clinic  Participants: Patient Location of Patient: Home Location of Provider: Telehealth Consent was obtain for visit to be over via telehealth. I verified that I am speaking with the correct person using two identifiers.  PCP:  Jane Helper, MD   Chief Complaint: Cough and dyspnea  History of Present Illness:    Jane English is a 52 y.o. female who has a video visit for complaint of cough and dyspnea for the last 3 days.  She has cough with yellowish-green expectoration and chronic nasal congestion.  Denies any fever.  She has dyspnea and mild wheezing.  She has history of asthma and uses Breztri and as needed albuterol currently.  Denies any recent sick contacts.  She has a history of allergic rhinitis.  The patient does not have symptoms concerning for COVID-19 infection (fever, chills, cough, or new shortness of breath).   Past Medical, Surgical, Social History, Allergies, and Medications have been Reviewed.  Past Medical History:  Diagnosis Date   Anemia    Asthma    Asthma flare 04/09/2013   Back pain    Bronchitis    Chronic abdominal pain    Chronic constipation    Constipation due to opioid therapy    Depression    Depression, major, single episode, severe ([place]) 10/03/2018   PHQ 9 score of 15   Diabetes mellitus without complication ([place])    Diabetes mellitus, type II ([place])    DVT (deep venous thrombosis) ([place]) 2010   GERD  (gastroesophageal reflux disease)    Heart murmur    no cardiologist   Helicobacter pylori gastritis 06/11/2013   Colonoscopy Dr. Hilarie Fredrickson   Hypertension    IBS (irritable bowel syndrome)    Migraine headache    Neuropathy    Obesity    Obsessive-compulsive disorder    PSYCHOTIC D/O W/HALLUCINATIONS CONDS CLASS ELSW 03/04/2010   Qualifier: Diagnosis of  By: Moshe Cipro MD, Margaret     PTSD (post-traumatic stress disorder)    SBO (small bowel obstruction) ([place]) 08/09/2013   Seasonal allergies 12/10/2012   Seizures ([place])    Shortness of breath    Past Surgical History:  Procedure Laterality Date   ANTERIOR CERVICAL DECOMP/DISCECTOMY FUSION  07/07/2012   Procedure: ANTERIOR CERVICAL DECOMPRESSION/DISCECTOMY FUSION 2 LEVELS;  Surgeon: Floyce Stakes, MD;  Location: [facility];  Service: Neurosurgery;  Laterality: N/A;  Cervical four-five, five - six  Anterior cervical decompression/diskectomy/fusion/plate   APPENDECTOMY  11/04/1984   BOWEL RESECTION N/A 07/29/2013   Procedure: serosal repair;  Surgeon: Adin Hector, MD;  Location: [facility];  Service: General;  Laterality: N/A;   CARPAL TUNNEL RELEASE Bilateral    COLON SURGERY N/A    [employer] 07/29/2020   LAPAROSCOPY N/A 07/29/2013   Procedure: diagnostic laporoscopy;  Surgeon: Adin Hector, MD;  Location: [facility];  Service: General;  Laterality: N/A;   LAPAROSCOPY N/A 08/16/2013   Procedure: LAPAROSCOPY DIAGNOSTIC/LYSIS OF  ADHESIONS;  Surgeon: Adin Hector, MD;  Location: [facility];  Service: General;  Laterality: N/A;   LAPAROTOMY N/A 08/16/2013   Procedure: EXPLORATORY LAPAROTOMY/SMALL BOWEL RESECTION (JEJUNUM);  Surgeon: Adin Hector, MD;  Location: [facility];  Service: General;  Laterality: N/A;   LUMBAR SPINE SURGERY  11/04/2008   x 3   LYSIS OF ADHESION  11/04/2001   Dr. Irving Shows   LYSIS OF ADHESION N/A 07/29/2013   Procedure: LYSIS OF ADHESION;  Surgeon: Adin Hector, MD;  Location: [facility];  Service: General;  Laterality:  N/A;   [place]?   [place]na [place], [place]   SPINAL CORD STIMULATOR IMPLANT     spine stimulator removal  12/13/2022   SPINE SURGERY N/A    [employer]rees[employer] 07/29/2020   TRIGGER FINGER RELEASE  11/05/2007   right pinkie finger   TUBAL LIGATION  11/04/1992     Current Meds  Medication Sig   azithromycin (ZITHROMAX) 250 MG tablet Take 2 tablets on day 1, then 1 tablet daily on days 2 through 5   benzonatate (TESSALON) 200 MG capsule Take 1 capsule (200 mg total) by mouth 2 (two) times daily as needed for cough.   methylPREDNISolone (MEDROL DOSEPAK) 4 MG TBPK tablet Take as package instructions.   Current Facility-Administered Medications for the 01/29/23 encounter (Video Visit) with Jane Spar, MD  Medication   betamethasone acetate-betamethasone sodium phosphate (CELESTONE) injection 6 mg     Allergies:   Neurontin [gabapentin], Penicillins, Pregabalin, Tramadol, and Latex   ROS:   Please see the history of present illness.     All other systems reviewed and are negative.   Labs/Other Tests and Data Reviewed:    Recent Labs: 03/21/2022: TSH 1.430 06/05/2022: Hemoglobin 13.2; Platelets 265 09/23/2022: ALT 28; BUN 15; Creatinine, Ser 1.06; Potassium 3.5; Sodium 140   Recent Lipid Panel Lab Results  Component Value Date/Time   CHOL 165 09/23/2022 08:36 AM   TRIG 108 09/23/2022 08:36 AM   HDL 58 09/23/2022 08:36 AM   CHOLHDL 2.8 09/23/2022 08:36 AM   CHOLHDL 3.5 12/06/2019 03:08 PM   LDLCALC 88 09/23/2022 08:36 AM   LDLCALC 117 (H) 12/06/2019 03:08 PM    Wt Readings from Last 3 Encounters:  01/21/23 161 lb 1.9 oz (73.1 kg)  01/06/23 165 lb 9.6 oz (75.1 kg)  12/31/22 163 lb 8 oz (74.2 kg)     Objective:    Vital Signs:  There were no vitals taken for this visit.   VITAL SIGNS:  reviewed GEN:  no acute distress EYES:  sclerae anicteric, EOMI - Extraocular Movements Intact RESPIRATORY:  Mild wheezing NEURO:  alert and oriented x 3, no  obvious focal deficit PSYCH:  normal affect  ASSESSMENT & PLAN:    Acute bronchitis Cough variant asthma Started Medrol Dosepak Promethazine DM and Tessalon as needed for cough Continue Breztri and as needed albuterol for dyspnea and wheezing Started empiric azithromycin considering recurrent sinusitis    I discussed the assessment and treatment plan with the patient. The patient was provided an opportunity to ask questions, and all were answered. The patient agreed with the plan and demonstrated an understanding of the instructions.   The patient was advised to call back or seek an in-person evaluation if the symptoms worsen or if the condition fails to improve as anticipated.  The above assessment and management plan was discussed with the patient. The patient verbalized understanding of and has agreed to  the management plan.   Medication Adjustments/Labs and Tests Ordered: Current medicines are reviewed at length with the patient today.  Concerns regarding medicines are outlined above.   Tests Ordered: No orders of the defined types were placed in this encounter.   Medication Changes: Meds ordered this encounter  Medications   methylPREDNISolone (MEDROL DOSEPAK) 4 MG TBPK tablet    Sig: Take as package instructions.    Dispense:  1 each    Refill:  0   promethazine-dextromethorphan (PROMETHAZINE-DM) 6.25-15 MG/5ML syrup    Sig: Take one teaspoon by mouth twice daily as needed, for excessive cough    Dispense:  118 mL    Refill:  0   azithromycin (ZITHROMAX) 250 MG tablet    Sig: Take 2 tablets on day 1, then 1 tablet daily on days 2 through 5    Dispense:  6 tablet    Refill:  0   benzonatate (TESSALON) 200 MG capsule    Sig: Take 1 capsule (200 mg total) by mouth 2 (two) times daily as needed for cough.    Dispense:  20 capsule    Refill:  0     Note: This dictation was prepared with Dragon dictation along with smaller phrase technology. Similar sounding words can  be transcribed inadequately or may not be corrected upon review. Any transcriptional errors that result from this process are unintentional.      Disposition:  Follow up  Signed, Jane Spar, MD  01/29/2023 11:53 AM     [facility]

## 2023-01-29 NOTE — Patient Instructions (Signed)
Please start taking Azithromycin and Prednisone as prescribed.  Please take Promethazine DM syrup as needed for cough.  Okay to take Tessalon Perles at nighttime as needed for cough.  Continue to use Breztri regularly and use albuterol as needed for shortness of breath or wheezing.

## 2023-02-03 ENCOUNTER — Telehealth: Payer: Self-pay | Admitting: Registered Nurse

## 2023-02-03 MED ORDER — OXYCODONE HCL 5 MG PO TABS: 10.0000 mg | ORAL_TABLET | Freq: Two times a day (BID) | ORAL | 0 refills | Status: DC

## 2023-02-03 MED ORDER — AMITRIPTYLINE HCL 10 MG PO TABS: ORAL_TABLET | ORAL | 0 refills | Status: DC

## 2023-02-03 NOTE — Telephone Encounter (Signed)
Dr Ranell Patrick send message asking for this provider to refill amitriptyline and Oxycodone since she was on vacation.  PMP was Reviewed  Medication sent to pharmacy.  My-Chart message sent to Ms. Chamblin.

## 2023-02-18 DIAGNOSIS — M961 Postlaminectomy syndrome, not elsewhere classified: Secondary | ICD-10-CM | POA: Diagnosis not present

## 2023-02-19 ENCOUNTER — Encounter: Payer: Self-pay | Admitting: Family Medicine

## 2023-02-21 ENCOUNTER — Telehealth (INDEPENDENT_AMBULATORY_CARE_PROVIDER_SITE_OTHER): Payer: Medicare HMO | Admitting: Family Medicine

## 2023-02-21 ENCOUNTER — Encounter: Payer: Medicare HMO | Attending: Physical Medicine and Rehabilitation | Admitting: Physical Medicine and Rehabilitation

## 2023-02-21 VITALS — BP 109/75 | HR 79 | Ht 66.0 in | Wt 160.2 lb

## 2023-02-21 DIAGNOSIS — J0100 Acute maxillary sinusitis, unspecified: Secondary | ICD-10-CM | POA: Diagnosis not present

## 2023-02-21 DIAGNOSIS — H9201 Otalgia, right ear: Secondary | ICD-10-CM

## 2023-02-21 DIAGNOSIS — M961 Postlaminectomy syndrome, not elsewhere classified: Secondary | ICD-10-CM

## 2023-02-21 DIAGNOSIS — L299 Pruritus, unspecified: Secondary | ICD-10-CM

## 2023-02-21 DIAGNOSIS — G4701 Insomnia due to medical condition: Secondary | ICD-10-CM | POA: Insufficient documentation

## 2023-02-21 DIAGNOSIS — L309 Dermatitis, unspecified: Secondary | ICD-10-CM | POA: Diagnosis not present

## 2023-02-21 DIAGNOSIS — G894 Chronic pain syndrome: Secondary | ICD-10-CM | POA: Diagnosis not present

## 2023-02-21 DIAGNOSIS — E663 Overweight: Secondary | ICD-10-CM | POA: Diagnosis not present

## 2023-02-21 MED ORDER — PREDNISONE 10 MG PO TABS
ORAL_TABLET | ORAL | 0 refills | Status: DC
Start: 2023-02-21 — End: 2023-03-11

## 2023-02-21 MED ORDER — HYDROXYZINE PAMOATE 25 MG PO CAPS: ORAL_CAPSULE | ORAL | 0 refills | Status: DC

## 2023-02-21 MED ORDER — SULFAMETHOXAZOLE-TRIMETHOPRIM 800-160 MG PO TABS: 1.0000 | ORAL_TABLET | Freq: Two times a day (BID) | ORAL | 0 refills | Status: DC

## 2023-02-21 MED ORDER — CIPROFLOXACIN-DEXAMETHASONE 0.3-0.1 % OT SUSP
OTIC | 0 refills | Status: DC
Start: 2023-02-21 — End: 2023-04-22

## 2023-02-21 NOTE — Progress Notes (Unsigned)
Virtual Visit via Video Note  I connected with Jane English on 02/21/23 at  3:00 PM EDT by a video enabled telemedicine application and verified that I am speaking with the correct person using two identifiers.  Location: Patient: home Provider: office   I discussed the limitations of evaluation and management by telemedicine and the availability of in person appointments. The patient expressed understanding and agreed to proceed.  History of Present Illness: Breaking out on back , small spot, now all over the body, small red bumps, itch is severe   Observations/Objective:   Assessment and Plan:   Follow Up Instructions:    I discussed the assessment and treatment plan with the patient. The patient was provided an opportunity to ask questions and all were answered. The patient agreed with the plan and demonstrated an understanding of the instructions.   The patient was advised to call back or seek an in-person evaluation if the symptoms worsen or if the condition fails to improve as anticipated.  I provided *** minutes of non-face-to-face time during this encounter.   Syliva Overman, MD

## 2023-02-21 NOTE — Patient Instructions (Signed)
Foods that may reduce pain: 1) Ginger (especially studied for arthritis)- reduce leukotriene production to decrease inflammation 2) Blueberries- high in phytonutrients that decrease inflammation 3) Salmon- marine omega-3s reduce joint swelling and pain 4) Pumpkin seeds- reduce inflammation 5) dark chocolate- reduces inflammation 6) turmeric- reduces inflammation 7) tart cherries - reduce pain and stiffness 8) extra virgin olive oil - its compound olecanthal helps to block prostaglandins  9) chili peppers- can be eaten or applied topically via capsaicin 10) mint- helpful for headache, muscle aches, joint pain, and itching 11) garlic- reduces inflammation  Link to further information on diet for chronic pain: [email or web address]://[email or web address]/diet-[email or web address]-[email or web address]-pain    Insomnia: -Try to go outside near sunrise -Get exercise during the day.  -Turn off all devices an hour before bedtime.  -Teas that can benefit: chamomile, valerian root, Brahmi (Bacopa) -Can consider over the counter melatonin, magnesium, and/or L-theanine. Melatonin is an anti-oxidant with multiple health benefits. Magnesium is involved in greater than 300 enzymatic reactions in the body and most of us are deficient as our soil is often depleted. There are 7 different types of magnesium- Bioptemizer's is a supplement with all 7 types, and each has unique benefits. Magnesium can also help with constipation and anxiety.  -Pistachios naturally increase the production of melatonin -[employer]zy Earth bamboo bed sheets are free from toxic chemicals.  -Tart cherry juice or a tart cherry supplement can improve sleep and soreness post-workout   

## 2023-02-21 NOTE — Progress Notes (Signed)
Subjective:    Patient ID: Jane English, female    DOB: 10/08/1971, 52 y.o.   MRN: [id number]  HPI Jane English is a 52 year old woman who presents for follow-up of failed back syndrome and history of obesity, now overweight  1) Overweight, history of obesity Continues to lose weight  2) Failed back syndrome: -needs refills of her oxycodone -had stimulator removed and site has been hurting since then -stents removed as well.  -going back to see him on the 13th -no signs of infection -staples removed.  -returning to work today -needs refill of oxycodone  3) insomnia -continues to sleep poorly at night  Pain Inventory Average Pain 9 Pain Right Now 8 My pain is intermittent, constant, sharp, burning, dull, stabbing, tingling, and aching  In the last 24 hours, has pain interfered with the following? General activity 8 Relation with others 6 Enjoyment of life 8 What TIME of day is your pain at its worst? morning , evening, and night Sleep (in general) Fair  Pain is worse with: walking, bending, sitting, inactivity, standing, and some activites Pain improves with: rest, heat/ice, and medication Relief from Meds: 8  Family History  Problem Relation Age of Onset   Physical abuse Mother    Alcohol abuse Mother    Cirrhosis Mother    Lung cancer Father    Stomach cancer Father    Esophageal cancer Father    Alcohol abuse Father    Mental illness Father    Diabetes Sister    Hypertension Sister    Bipolar disorder Sister    Schizophrenia Sister    Diabetes Sister    Drug abuse Sister    HIV Sister    Pneumonia Sister        died as a baby   Alcohol abuse Brother    Hypertension Brother    Kidney disease Brother    Diabetes Brother    Drug abuse Brother    Mental illness Brother    Alcohol abuse Brother    Alcohol abuse Brother    Hypertension Brother    Diabetes Brother    Alcohol abuse Brother    Mental illness Brother        in [place]   Alcohol abuse  Brother    Alcohol abuse Brother    Bipolar disorder Brother    Hypertension Brother    Bipolar disorder Brother    Drug abuse Brother    Alcohol abuse Brother    Bipolar disorder Brother    Bipolar disorder Daughter    Bipolar disorder Son    Bipolar disorder Son    ADD / ADHD Neg Hx    Anxiety disorder Neg Hx    Dementia Neg Hx    Depression Neg Hx    OCD Neg Hx    Seizures Neg Hx    Paranoid behavior Neg Hx    Colon cancer Neg Hx    Rectal cancer Neg Hx    Social History   Socioeconomic History   Marital status: Married    Spouse name: Jane English   Number of children: 4   Years of education: 12   Highest education level: Some college, no degree  Occupational History   Occupation: disabled  Tobacco Use   Smoking status: Never   Smokeless tobacco: Never  Vaping Use   Vaping Use: Never used  Substance and Sexual Activity   Alcohol use: No   Drug use: No   Sexual activity: Not  Currently    Partners: Male    Birth control/protection: Surgical    Comment: tubal  Other Topics Concern   Not on file  Social History Narrative   Not on file   Social Determinants of Health   Financial Resource Strain: Low Risk  (11/08/2021)   Overall Financial Resource Strain (CARDIA)    Difficulty of Paying Living Expenses: Not hard at all  Food Insecurity: No Food Insecurity (11/08/2021)   Hunger Vital Sign    Worried About Running Out of Food in the Last Year: Never true    Ran Out of Food in the Last Year: Never true  Transportation Needs: No Transportation Needs (11/08/2021)   PRAPARE - Administrator, Civil Service (Medical): No    Lack of Transportation (Non-Medical): No  Physical Activity: Insufficiently Active (11/08/2021)   Exercise Vital Sign    Days of Exercise per Week: 3 days    Minutes of Exercise per Session: 30 min  Stress: No Stress Concern Present (11/08/2021)   [employer] of Occupational Health - Occupational Stress Questionnaire    Feeling of Stress :  Only a little  Social Connections: Moderately Isolated (11/08/2021)   Social Connection and Isolation Panel [NHANES]    Frequency of Communication with Friends and Family: More than three times a week    Frequency of Social Gatherings with Friends and Family: Once a week    Attends Religious Services: Never    Database administrator or Organizations: No    Attends Engineer, structural: Never    Marital Status: Married   Past Surgical History:  Procedure Laterality Date   ANTERIOR CERVICAL DECOMP/DISCECTOMY FUSION  07/07/2012   Procedure: ANTERIOR CERVICAL DECOMPRESSION/DISCECTOMY FUSION 2 LEVELS;  Surgeon: Karn Cassis, MD;  Location: [facility];  Service: Neurosurgery;  Laterality: N/A;  Cervical four-five, five - six  Anterior cervical decompression/diskectomy/fusion/plate   APPENDECTOMY  11/04/1984   BOWEL RESECTION N/A 07/29/2013   Procedure: serosal repair;  Surgeon: Ardeth Sportsman, MD;  Location: [facility];  Service: General;  Laterality: N/A;   CARPAL TUNNEL RELEASE Bilateral    COLON SURGERY N/A    Phreesia 07/29/2020   LAPAROSCOPY N/A 07/29/2013   Procedure: diagnostic laporoscopy;  Surgeon: Ardeth Sportsman, MD;  Location: [facility];  Service: General;  Laterality: N/A;   LAPAROSCOPY N/A 08/16/2013   Procedure: LAPAROSCOPY DIAGNOSTIC/LYSIS OF ADHESIONS;  Surgeon: Ardeth Sportsman, MD;  Location: [facility];  Service: General;  Laterality: N/A;   LAPAROTOMY N/A 08/16/2013   Procedure: EXPLORATORY LAPAROTOMY/SMALL BOWEL RESECTION (JEJUNUM);  Surgeon: Ardeth Sportsman, MD;  Location: [facility];  Service: General;  Laterality: N/A;   LUMBAR SPINE SURGERY  11/04/2008   x 3   LYSIS OF ADHESION  11/04/2001   Dr. Elpidio Anis   LYSIS OF ADHESION N/A 07/29/2013   Procedure: LYSIS OF ADHESION;  Surgeon: Ardeth Sportsman, MD;  Location: [facility];  Service: General;  Laterality: N/A;   OOPHORECTOMY     PARTIAL HYSTERECTOMY  1990s?   [place], [place]   SPINAL CORD STIMULATOR IMPLANT      spine stimulator removal  12/13/2022   SPINE SURGERY N/A    Phreesia 07/29/2020   TRIGGER FINGER RELEASE  11/05/2007   right pinkie finger   TUBAL LIGATION  11/04/1992   Past Surgical History:  Procedure Laterality Date   ANTERIOR CERVICAL DECOMP/DISCECTOMY FUSION  07/07/2012   Procedure: ANTERIOR CERVICAL DECOMPRESSION/DISCECTOMY FUSION 2 LEVELS;  Surgeon: Karn Cassis,  MD;  Location: [facility];  Service: Neurosurgery;  Laterality: N/A;  Cervical four-five, five - six  Anterior cervical decompression/diskectomy/fusion/plate   APPENDECTOMY  11/04/1984   BOWEL RESECTION N/A 07/29/2013   Procedure: serosal repair;  Surgeon: Ardeth Sportsman, MD;  Location: [facility];  Service: General;  Laterality: N/A;   CARPAL TUNNEL RELEASE Bilateral    COLON SURGERY N/A    Phreesia 07/29/2020   LAPAROSCOPY N/A 07/29/2013   Procedure: diagnostic laporoscopy;  Surgeon: Ardeth Sportsman, MD;  Location: [facility];  Service: General;  Laterality: N/A;   LAPAROSCOPY N/A 08/16/2013   Procedure: LAPAROSCOPY DIAGNOSTIC/LYSIS OF ADHESIONS;  Surgeon: Ardeth Sportsman, MD;  Location: [facility];  Service: General;  Laterality: N/A;   LAPAROTOMY N/A 08/16/2013   Procedure: EXPLORATORY LAPAROTOMY/SMALL BOWEL RESECTION (JEJUNUM);  Surgeon: Ardeth Sportsman, MD;  Location: [facility];  Service: General;  Laterality: N/A;   LUMBAR SPINE SURGERY  11/04/2008   x 3   LYSIS OF ADHESION  11/04/2001   Dr. Elpidio Anis   LYSIS OF ADHESION N/A 07/29/2013   Procedure: LYSIS OF ADHESION;  Surgeon: Ardeth Sportsman, MD;  Location: [facility];  Service: General;  Laterality: N/A;   OOPHORECTOMY     PARTIAL HYSTERECTOMY  1990s?   [place], [place]   SPINAL CORD STIMULATOR IMPLANT     spine stimulator removal  12/13/2022   SPINE SURGERY N/A    Phreesia 07/29/2020   TRIGGER FINGER RELEASE  11/05/2007   right pinkie finger   TUBAL LIGATION  11/04/1992   Past Medical History:  Diagnosis Date   Anemia    Asthma    Asthma flare 04/09/2013    Back pain    Bronchitis    Chronic abdominal pain    Chronic constipation    Constipation due to opioid therapy    Depression    Depression, major, single episode, severe (HCC) 10/03/2018   PHQ 9 score of 15   Diabetes mellitus without complication (HCC)    Diabetes mellitus, type II (HCC)    DVT (deep venous thrombosis) (HCC) 2010   GERD (gastroesophageal reflux disease)    Heart murmur    no cardiologist   Helicobacter pylori gastritis 06/11/2013   Colonoscopy Dr. Rhea Belton   Hypertension    IBS (irritable bowel syndrome)    Migraine headache    Neuropathy    Obesity    Obsessive-compulsive disorder    PSYCHOTIC D/O W/HALLUCINATIONS CONDS CLASS ELSW 03/04/2010   Qualifier: Diagnosis of  By: Lodema Hong MD, Margaret     PTSD (post-traumatic stress disorder)    SBO (small bowel obstruction) (HCC) 08/09/2013   Seasonal allergies 12/10/2012   Seizures (HCC)    Shortness of breath    BP 109/75   Pulse 79   Ht 5\' 6"  (1.676 m)   Wt 160 lb 3.2 oz (72.7 kg)   SpO2 98%   BMI 25.86 kg/m   Opioid Risk Score:   Fall Risk Score:  `1  Depression screen [facility] 2/9     01/21/2023    9:19 AM 11/11/2022    3:17 PM 10/15/2022   10:05 AM 09/20/2022    9:05 AM 09/06/2022    9:45 AM 08/06/2022   10:01 AM 06/20/2022    9:05 AM  Depression screen PHQ 2/9  Decreased Interest 2 1 0 0 0 0 0  Down, Depressed, Hopeless 2 1 0 0 0 0 0  PHQ - 2 Score 4 2 0 0 0 0 0  Altered  sleeping 2        Tired, decreased energy 2        Change in appetite 0        Feeling bad or failure about yourself  1        Trouble concentrating 0        Moving slowly or fidgety/restless 0        Suicidal thoughts 0        PHQ-9 Score 9        Difficult doing work/chores Not difficult at all           Review of Systems  Musculoskeletal:        Bilateral shoulder pain Left hip pain Left knee pain  All other systems reviewed and are negative.      Objective:   Physical Exam Gen: no distress, normal appearing, BMI  25.86 HEENT: oral mucosa pink and moist, NCAT Cardio: Reg rate Chest: normal effort, normal rate of breathing Abd: soft, non-distended Ext: no edema Psych: pleasant, normal affect Skin: intact Neuro: Alert and oriented x3, ambulating normally        Assessment & Plan:   1) Failed back syndrome -refilled oxycodone -encouraged follow-up with surgeon regarding pain after spinal cord stimulator removal -Provided with a pain relief journal and discussed that it contains foods and lifestyle tips to naturally help to improve pain. Discussed that these lifestyle strategies are also very good for health unlike some medications which can have negative side effects. Discussed that the act of keeping a journal can be therapeutic and helpful to realize patterns what helps to trigger and alleviate pain.  . -Discussed following foods that may reduce pain: 1) Ginger (especially studied for arthritis)- reduce leukotriene production to decrease inflammation 2) Blueberries- high in phytonutrients that decrease inflammation 3) Salmon- marine omega-3s reduce joint swelling and pain 4) Pumpkin seeds- reduce inflammation 5) dark chocolate- reduces inflammation 6) turmeric- reduces inflammation 7) tart cherries - reduce pain and stiffness 8) extra virgin olive oil - its compound olecanthal helps to block prostaglandins  9) chili peppers- can be eaten or applied topically via capsaicin 10) mint- helpful for headache, muscle aches, joint pain, and itching 11) garlic- reduces inflammation  Link to further information on diet for chronic pain: [email or web address]    2) Overweight, history of obesity -discussed slight weight increase -provided dietary education -continue Mounjaro  3) Insomnia: -Try to go outside near sunrise -Get exercise during the day.  -Turn off all devices an hour before bedtime.  -Teas that can benefit:  chamomile, valerian root, Brahmi (Bacopa) -Can consider over the counter melatonin, magnesium, and/or L-theanine. Melatonin is an anti-oxidant with multiple health benefits. Magnesium is involved in greater than 300 enzymatic reactions in the body and most of [place] are deficient as our soil is often depleted. There are 7 different types of magnesium- Bioptemizer's is a supplement with all 7 types, and each has unique benefits. Magnesium can also help with constipation and anxiety.  -Pistachios naturally increase the production of melatonin -[employer] bamboo bed sheets are free from toxic chemicals.  -Tart cherry juice or a tart cherry supplement can improve sleep and soreness post-workout

## 2023-02-21 NOTE — Patient Instructions (Signed)
F/U as before, call if you need me sooner  Hydroxyzine 25 mg tablet is prescribed short term for itching, do NOT take the 10 mg tablet while taking this  Short course of prednisone is prescribed for the rash  Only put calamine lotion on your skin if needed, for rash, nothing else  Septra is prescribed for right ear and sinus infection and also an ear drop is prescribed for use in the right ear  Fluconazole tablets are prescribed if needed , for vaginal itch/ infection from yeasy after antibiotic and/ or prednisone use  Thanks for choosing [facility], we consider it a privelige to serve you.

## 2023-02-25 ENCOUNTER — Encounter: Payer: Self-pay | Admitting: Family Medicine

## 2023-02-25 DIAGNOSIS — H9201 Otalgia, right ear: Secondary | ICD-10-CM | POA: Insufficient documentation

## 2023-02-25 DIAGNOSIS — L309 Dermatitis, unspecified: Secondary | ICD-10-CM | POA: Insufficient documentation

## 2023-02-25 DIAGNOSIS — J0100 Acute maxillary sinusitis, unspecified: Secondary | ICD-10-CM | POA: Insufficient documentation

## 2023-02-25 DIAGNOSIS — L299 Pruritus, unspecified: Secondary | ICD-10-CM | POA: Insufficient documentation

## 2023-02-25 NOTE — Assessment & Plan Note (Signed)
Septra prescribed and fluconazole if needed for vaginal itch

## 2023-02-25 NOTE — Assessment & Plan Note (Signed)
Course of prednisone and calamine lotion topically if neded

## 2023-02-25 NOTE — Assessment & Plan Note (Signed)
Ciprodex prescribed for 5 days , then , as needed

## 2023-02-25 NOTE — Assessment & Plan Note (Signed)
Short course hydroxyzine  25 mg every 8 hrs if needed

## 2023-02-28 DIAGNOSIS — M961 Postlaminectomy syndrome, not elsewhere classified: Secondary | ICD-10-CM | POA: Diagnosis not present

## 2023-03-03 ENCOUNTER — Other Ambulatory Visit: Payer: Self-pay | Admitting: Family Medicine

## 2023-03-03 DIAGNOSIS — J45991 Cough variant asthma: Secondary | ICD-10-CM

## 2023-03-07 ENCOUNTER — Encounter: Payer: Self-pay | Admitting: Physical Medicine and Rehabilitation

## 2023-03-07 ENCOUNTER — Encounter: Payer: Medicare HMO | Attending: Physical Medicine and Rehabilitation | Admitting: Physical Medicine and Rehabilitation

## 2023-03-07 VITALS — BP 107/72 | HR 88 | Ht 66.0 in | Wt 160.0 lb

## 2023-03-07 DIAGNOSIS — G894 Chronic pain syndrome: Secondary | ICD-10-CM | POA: Diagnosis not present

## 2023-03-07 DIAGNOSIS — M961 Postlaminectomy syndrome, not elsewhere classified: Secondary | ICD-10-CM

## 2023-03-07 DIAGNOSIS — Z5181 Encounter for therapeutic drug level monitoring: Secondary | ICD-10-CM | POA: Insufficient documentation

## 2023-03-07 DIAGNOSIS — Z79899 Other long term (current) drug therapy: Secondary | ICD-10-CM | POA: Diagnosis not present

## 2023-03-07 DIAGNOSIS — F32A Depression, unspecified: Secondary | ICD-10-CM | POA: Insufficient documentation

## 2023-03-07 DIAGNOSIS — G4701 Insomnia due to medical condition: Secondary | ICD-10-CM

## 2023-03-07 MED ORDER — AMITRIPTYLINE HCL 10 MG PO TABS: ORAL_TABLET | ORAL | 0 refills | Status: DC

## 2023-03-07 MED ORDER — OXYCODONE HCL 5 MG PO TABS: 10.0000 mg | ORAL_TABLET | Freq: Two times a day (BID) | ORAL | 0 refills | Status: DC

## 2023-03-07 NOTE — Progress Notes (Signed)
Subjective:    Patient ID: Jane English, female    DOB: 1971-09-10, 52 y.o.   MRN: [id number]  HPI Mrs. Cadden is a 52 year old woman who presents for follow-up of failed back syndrome and history of obesity, now overweight  1) Overweight, history of obesity Continues to lose weight  2) Failed back syndrome: -needs refills of her oxycodone -has been using turmeric and collagen -needs refill of oxycodone and amitriptyline today -had stimulator removed and site has been hurting since then -stents removed as well.  -going back to see him on the 13th -no signs of infection -staples removed.  -returning to work today -needs refill of oxycodone  3) insomnia -continues to sleep poorly at night  4) depression -has been having some depression but pushes through  Pain Inventory Average Pain 9 Pain Right Now 7 My pain is intermittent, constant, sharp, burning, dull, stabbing, tingling, and aching  In the last 24 hours, has pain interfered with the following? General activity 8 Relation with others 8 Enjoyment of life 8 What TIME of day is your pain at its worst? morning , evening, and night Sleep (in general) Fair  Pain is worse with: walking, bending, sitting, inactivity, standing, and some activites Pain improves with: rest, heat/ice, and medication Relief from Meds: 9  Family History  Problem Relation Age of Onset   Physical abuse Mother    Alcohol abuse Mother    Cirrhosis Mother    Lung cancer Father    Stomach cancer Father    Esophageal cancer Father    Alcohol abuse Father    Mental illness Father    Diabetes Sister    Hypertension Sister    Bipolar disorder Sister    Schizophrenia Sister    Diabetes Sister    Drug abuse Sister    HIV Sister    Pneumonia Sister        died as a baby   Alcohol abuse Brother    Hypertension Brother    Kidney disease Brother    Diabetes Brother    Drug abuse Brother    Mental illness Brother    Alcohol abuse Brother     Alcohol abuse Brother    Hypertension Brother    Diabetes Brother    Alcohol abuse Brother    Mental illness Brother        in [place]   Alcohol abuse Brother    Alcohol abuse Brother    Bipolar disorder Brother    Hypertension Brother    Bipolar disorder Brother    Drug abuse Brother    Alcohol abuse Brother    Bipolar disorder Brother    Bipolar disorder Daughter    Bipolar disorder Son    Bipolar disorder Son    ADD / ADHD Neg Hx    Anxiety disorder Neg Hx    Dementia Neg Hx    Depression Neg Hx    OCD Neg Hx    Seizures Neg Hx    Paranoid behavior Neg Hx    Colon cancer Neg Hx    Rectal cancer Neg Hx    Social History   Socioeconomic History   Marital status: Married    Spouse name: Molly Maduro   Number of children: 4   Years of education: 12   Highest education level: Some college, no degree  Occupational History   Occupation: disabled  Tobacco Use   Smoking status: Never   Smokeless tobacco: Never  Vaping Use  Vaping Use: Never used  Substance and Sexual Activity   Alcohol use: No   Drug use: No   Sexual activity: Not Currently    Partners: Male    Birth control/protection: Surgical    Comment: tubal  Other Topics Concern   Not on file  Social History Narrative   Not on file   Social Determinants of Health   Financial Resource Strain: Low Risk  (11/08/2021)   Overall Financial Resource Strain (CARDIA)    Difficulty of Paying Living Expenses: Not hard at all  Food Insecurity: No Food Insecurity (11/08/2021)   Hunger Vital Sign    Worried About Running Out of Food in the Last Year: Never true    Ran Out of Food in the Last Year: Never true  Transportation Needs: No Transportation Needs (11/08/2021)   PRAPARE - Administrator, Civil Service (Medical): No    Lack of Transportation (Non-Medical): No  Physical Activity: Insufficiently Active (11/08/2021)   Exercise Vital Sign    Days of Exercise per Week: 3 days    Minutes of Exercise per Session:  30 min  Stress: No Stress Concern Present (11/08/2021)   [employer] of Occupational Health - Occupational Stress Questionnaire    Feeling of Stress : Only a little  Social Connections: Moderately Isolated (11/08/2021)   Social Connection and Isolation Panel [NHANES]    Frequency of Communication with Friends and Family: More than three times a week    Frequency of Social Gatherings with Friends and Family: Once a week    Attends Religious Services: Never    Database administrator or Organizations: No    Attends Engineer, structural: Never    Marital Status: Married   Past Surgical History:  Procedure Laterality Date   ANTERIOR CERVICAL DECOMP/DISCECTOMY FUSION  07/07/2012   Procedure: ANTERIOR CERVICAL DECOMPRESSION/DISCECTOMY FUSION 2 LEVELS;  Surgeon: Karn Cassis, MD;  Location: [facility];  Service: Neurosurgery;  Laterality: N/A;  Cervical four-five, five - six  Anterior cervical decompression/diskectomy/fusion/plate   APPENDECTOMY  11/04/1984   BOWEL RESECTION N/A 07/29/2013   Procedure: serosal repair;  Surgeon: Ardeth Sportsman, MD;  Location: [facility];  Service: General;  Laterality: N/A;   CARPAL TUNNEL RELEASE Bilateral    COLON SURGERY N/A    Phreesia 07/29/2020   LAPAROSCOPY N/A 07/29/2013   Procedure: diagnostic laporoscopy;  Surgeon: Ardeth Sportsman, MD;  Location: [facility];  Service: General;  Laterality: N/A;   LAPAROSCOPY N/A 08/16/2013   Procedure: LAPAROSCOPY DIAGNOSTIC/LYSIS OF ADHESIONS;  Surgeon: Ardeth Sportsman, MD;  Location: [facility];  Service: General;  Laterality: N/A;   LAPAROTOMY N/A 08/16/2013   Procedure: EXPLORATORY LAPAROTOMY/SMALL BOWEL RESECTION (JEJUNUM);  Surgeon: Ardeth Sportsman, MD;  Location: [facility];  Service: General;  Laterality: N/A;   LUMBAR SPINE SURGERY  11/04/2008   x 3   LYSIS OF ADHESION  11/04/2001   Dr. Elpidio Anis   LYSIS OF ADHESION N/A 07/29/2013   Procedure: LYSIS OF ADHESION;  Surgeon: Ardeth Sportsman, MD;   Location: [facility];  Service: General;  Laterality: N/A;   OOPHORECTOMY     PARTIAL HYSTERECTOMY  1990s?   [place], [place]   SPINAL CORD STIMULATOR IMPLANT     spine stimulator removal  12/13/2022   SPINE SURGERY N/A    Phreesia 07/29/2020   TRIGGER FINGER RELEASE  11/05/2007   right pinkie finger   TUBAL LIGATION  11/04/1992   Past Surgical History:  Procedure  Laterality Date   ANTERIOR CERVICAL DECOMP/DISCECTOMY FUSION  07/07/2012   Procedure: ANTERIOR CERVICAL DECOMPRESSION/DISCECTOMY FUSION 2 LEVELS;  Surgeon: Karn Cassis, MD;  Location: [facility];  Service: Neurosurgery;  Laterality: N/A;  Cervical four-five, five - six  Anterior cervical decompression/diskectomy/fusion/plate   APPENDECTOMY  11/04/1984   BOWEL RESECTION N/A 07/29/2013   Procedure: serosal repair;  Surgeon: Ardeth Sportsman, MD;  Location: [facility];  Service: General;  Laterality: N/A;   CARPAL TUNNEL RELEASE Bilateral    COLON SURGERY N/A    Phreesia 07/29/2020   LAPAROSCOPY N/A 07/29/2013   Procedure: diagnostic laporoscopy;  Surgeon: Ardeth Sportsman, MD;  Location: [facility];  Service: General;  Laterality: N/A;   LAPAROSCOPY N/A 08/16/2013   Procedure: LAPAROSCOPY DIAGNOSTIC/LYSIS OF ADHESIONS;  Surgeon: Ardeth Sportsman, MD;  Location: [facility];  Service: General;  Laterality: N/A;   LAPAROTOMY N/A 08/16/2013   Procedure: EXPLORATORY LAPAROTOMY/SMALL BOWEL RESECTION (JEJUNUM);  Surgeon: Ardeth Sportsman, MD;  Location: [facility];  Service: General;  Laterality: N/A;   LUMBAR SPINE SURGERY  11/04/2008   x 3   LYSIS OF ADHESION  11/04/2001   Dr. Elpidio Anis   LYSIS OF ADHESION N/A 07/29/2013   Procedure: LYSIS OF ADHESION;  Surgeon: Ardeth Sportsman, MD;  Location: [facility];  Service: General;  Laterality: N/A;   OOPHORECTOMY     PARTIAL HYSTERECTOMY  1990s?   [place], [place]   SPINAL CORD STIMULATOR IMPLANT     spine stimulator removal  12/13/2022   SPINE SURGERY N/A    Phreesia 07/29/2020   TRIGGER FINGER  RELEASE  11/05/2007   right pinkie finger   TUBAL LIGATION  11/04/1992   Past Medical History:  Diagnosis Date   Anemia    Asthma    Asthma flare 04/09/2013   Back pain    Bronchitis    Chronic abdominal pain    Chronic constipation    Constipation due to opioid therapy    Depression    Depression, major, single episode, severe (HCC) 10/03/2018   PHQ 9 score of 15   Diabetes mellitus without complication (HCC)    Diabetes mellitus, type II (HCC)    DVT (deep venous thrombosis) (HCC) 2010   GERD (gastroesophageal reflux disease)    Heart murmur    no cardiologist   Helicobacter pylori gastritis 06/11/2013   Colonoscopy Dr. Rhea Belton   Hypertension    IBS (irritable bowel syndrome)    Migraine headache    Neuropathy    Obesity    Obsessive-compulsive disorder    PSYCHOTIC D/O W/HALLUCINATIONS CONDS CLASS ELSW 03/04/2010   Qualifier: Diagnosis of  By: Lodema Hong MD, Margaret     PTSD (post-traumatic stress disorder)    SBO (small bowel obstruction) (HCC) 08/09/2013   Seasonal allergies 12/10/2012   Seizures (HCC)    Shortness of breath    BP 107/72   Pulse 88   Ht 5\' 6"  (1.676 m)   Wt 160 lb (72.6 kg)   SpO2 96%   BMI 25.82 kg/m   Opioid Risk Score:   Fall Risk Score:  `1  Depression screen [facility] 2/9     03/07/2023    9:58 AM 01/21/2023    9:19 AM 11/11/2022    3:17 PM 10/15/2022   10:05 AM 09/20/2022    9:05 AM 09/06/2022    9:45 AM 08/06/2022   10:01 AM  Depression screen PHQ 2/9  Decreased Interest 0 2 1 0 0 0 0  Down, Depressed,  Hopeless 0 2 1 0 0 0 0  PHQ - 2 Score 0 4 2 0 0 0 0  Altered sleeping  2       Tired, decreased energy  2       Change in appetite  0       Feeling bad or failure about yourself   1       Trouble concentrating  0       Moving slowly or fidgety/restless  0       Suicidal thoughts  0       PHQ-9 Score  9       Difficult doing work/chores  Not difficult at all          Review of Systems  Musculoskeletal:        Bilateral shoulder pain Left  hip pain Left knee pain  All other systems reviewed and are negative.      Objective:   Physical Exam Gen: no distress, normal appearing, BMI 25.86 HEENT: oral mucosa pink and moist, NCAT Cardio: Reg rate Chest: normal effort, normal rate of breathing Abd: soft, non-distended Ext: no edema Psych: pleasant, normal affect, reports some episodes of depression but that she pushes through Skin: intact Neuro: Alert and oriented x3, ambulating normally        Assessment & Plan:   1) Failed back syndrome -refilled oxycodone and amitriptyline UDS today -encouraged follow-up with surgeon regarding pain after spinal cord stimulator removal -Provided with a pain relief journal and discussed that it contains foods and lifestyle tips to naturally help to improve pain. Discussed that these lifestyle strategies are also very good for health unlike some medications which can have negative side effects. Discussed that the act of keeping a journal can be therapeutic and helpful to realize patterns what helps to trigger and alleviate pain.  . -Discussed following foods that may reduce pain: 1) Ginger (especially studied for arthritis)- reduce leukotriene production to decrease inflammation 2) Blueberries- high in phytonutrients that decrease inflammation 3) Salmon- marine omega-3s reduce joint swelling and pain 4) Pumpkin seeds- reduce inflammation 5) dark chocolate- reduces inflammation 6) turmeric- reduces inflammation 7) tart cherries - reduce pain and stiffness 8) extra virgin olive oil - its compound olecanthal helps to block prostaglandins  9) chili peppers- can be eaten or applied topically via capsaicin 10) mint- helpful for headache, muscle aches, joint pain, and itching 11) garlic- reduces inflammation  Link to further information on diet for chronic pain: http://[email or web address].[email or web address]    2) Overweight, history of  obesity -discussed slight weight increase -provided dietary education -continue Mounjaro  3) Insomnia: -continue amitriptyline -Try to go outside near sunrise -Get exercise during the day.  -Turn off all devices an hour before bedtime.  -Teas that can benefit: chamomile, valerian root, Brahmi (Bacopa) -Can consider over the counter melatonin, magnesium, and/or L-theanine. Melatonin is an anti-oxidant with multiple health benefits. Magnesium is involved in greater than 300 enzymatic reactions in the body and most of [place] are deficient as our soil is often depleted. There are 7 different types of magnesium- Bioptemizer's is a supplement with all 7 types, and each has unique benefits. Magnesium can also help with constipation and anxiety.  -Pistachios naturally increase the production of melatonin -[employer]zy [employer] bamboo bed sheets are free from toxic chemicals.  -Tart cherry juice or a tart cherry supplement can improve sleep and soreness post-workout    4) Depression: -discussed plan to move in with her son at [facility]. -discussed her  resilience

## 2023-03-11 ENCOUNTER — Ambulatory Visit (INDEPENDENT_AMBULATORY_CARE_PROVIDER_SITE_OTHER): Payer: Medicare HMO | Admitting: Family Medicine

## 2023-03-11 VITALS — BP 130/74 | HR 89 | Ht 66.0 in | Wt 153.0 lb

## 2023-03-11 DIAGNOSIS — L309 Dermatitis, unspecified: Secondary | ICD-10-CM

## 2023-03-11 DIAGNOSIS — F322 Major depressive disorder, single episode, severe without psychotic features: Secondary | ICD-10-CM | POA: Diagnosis not present

## 2023-03-11 DIAGNOSIS — F411 Generalized anxiety disorder: Secondary | ICD-10-CM

## 2023-03-11 MED ORDER — PREDNISONE 5 MG PO TABS: 5.0000 mg | ORAL_TABLET | Freq: Two times a day (BID) | ORAL | 0 refills | Status: AC

## 2023-03-11 MED ORDER — PAROXETINE HCL 10 MG PO TABS: 10.0000 mg | ORAL_TABLET | Freq: Every day | ORAL | 2 refills | Status: DC

## 2023-03-11 MED ORDER — METHYLPREDNISOLONE ACETATE 80 MG/ML IJ SUSP
40.0000 mg | Freq: Once | INTRAMUSCULAR | Status: AC
Start: 2023-03-11 — End: 2023-03-11
  Administered 2023-03-11: 40 mg via INTRAMUSCULAR

## 2023-03-11 MED ORDER — HYDROXYZINE PAMOATE 25 MG PO CAPS: 25.0000 mg | ORAL_CAPSULE | Freq: Three times a day (TID) | ORAL | 1 refills | Status: DC

## 2023-03-11 NOTE — Telephone Encounter (Signed)
scheduled

## 2023-03-11 NOTE — Patient Instructions (Signed)
F/U in 8 to 10 weeks re evaluate depression and anxiety  Depomedrol 40 mg IM in office today, and 5 day course of prednisone is prescribed  New for anxiety and depression  are paxil and hydroxyzine  Please stop meloxicam, allegra and zyrtec  Thanks for choosing  [facility], we consider it a privelige to serve you.

## 2023-03-12 ENCOUNTER — Ambulatory Visit (INDEPENDENT_AMBULATORY_CARE_PROVIDER_SITE_OTHER): Payer: Medicare HMO | Admitting: Family Medicine

## 2023-03-12 ENCOUNTER — Encounter: Payer: Self-pay | Admitting: Family Medicine

## 2023-03-12 VITALS — BP 130/74 | HR 89 | Ht 66.0 in | Wt 153.0 lb

## 2023-03-12 DIAGNOSIS — F322 Major depressive disorder, single episode, severe without psychotic features: Secondary | ICD-10-CM

## 2023-03-12 DIAGNOSIS — F411 Generalized anxiety disorder: Secondary | ICD-10-CM

## 2023-03-12 DIAGNOSIS — E559 Vitamin D deficiency, unspecified: Secondary | ICD-10-CM | POA: Diagnosis not present

## 2023-03-12 DIAGNOSIS — Z13220 Encounter for screening for lipoid disorders: Secondary | ICD-10-CM | POA: Diagnosis not present

## 2023-03-12 DIAGNOSIS — I10 Essential (primary) hypertension: Secondary | ICD-10-CM

## 2023-03-12 DIAGNOSIS — E1169 Type 2 diabetes mellitus with other specified complication: Secondary | ICD-10-CM | POA: Diagnosis not present

## 2023-03-12 DIAGNOSIS — L309 Dermatitis, unspecified: Secondary | ICD-10-CM | POA: Diagnosis not present

## 2023-03-12 LAB — TOXASSURE SELECT,+ANTIDEPR,UR

## 2023-03-12 NOTE — Progress Notes (Signed)
   Jane English     MRN: [id number]      DOB: [birth year]  Chief Complaint  Patient presents with   Follow-up    Rash all over her body started yesterday    HPI Jane English is here with a 2 day h/o generalized rash, was tereated with steroids for this several weeks ago with some resolution  , again a flare No new exposure to topicals, food or drugs C/o uncontrolled anxiety, stress and depression Not suiicidal or homicidal ROS Denies recent fever or chills. Denies sinus pressure, nasal congestion, ear pain or sore throat. Denies chest congestion, productive cough or wheezing. Denies chest pains, palpitations and leg swelling .  PE  BP 130/74 (BP Location: Right Arm, Patient Position: Sitting, Cuff Size: Large)   Pulse 89   Ht 5\' 6"  (1.676 m)   Wt 153 lb 0.6 oz (69.4 kg)   SpO2 98%   BMI 24.70 kg/m   Patient alert and oriented and in no cardiopulmonary distress.  HEENT: No facial asymmetry, EOMI,     Neck supple .  Chest: Clear to auscultation bilaterally.  CVS: S1, S2 no murmurs, no S3.Regular rate.  ABD: Soft non tender.   Ext: No edema  MS: Adequate ROM spine, shoulders, hips and knees.  Skin: Intact, generalized weythema with fine macular rash arms, trunk back , erythema of face, no draining lesions  Psych: Good eye contact, . Memory intact  anxious and  depressed .  CNS: CN 2-12 intact, power,  normal throughout.no focal deficits noted.   Assessment & Plan  Dermatitis No bacterial superinfection Depomedrol 40 mg IM in office anf=d 5 day course of prednisone Hydroxyzine on schedule for itching  Depression, major, single episode, severe (HCC) Start paxil and 20 mg daily , refuses psych referral, not suicidal or homicidal and will seek help through ED if this changes  GAD (generalized anxiety disorder) Start hydroxyzine on scheduled basis and re eval in 4 weeks, declines therapy

## 2023-03-12 NOTE — Assessment & Plan Note (Signed)
Start hydroxyzine on scheduled basis and re eval in 4 weeks, declines therapy

## 2023-03-12 NOTE — Patient Instructions (Addendum)
F/U as before call if you need me sooner  Hydroxyzine needs to be taken every 8 hours.  Take today at 65 , then at 8 tonight , then around  6 in the morning  Tomorrow 6 am, 2 pm and 10 pm  Increase hydroxyzine  25 mg to TWO tablets every 8 hours for the next 3 days, when anxiety has lessened when your other medication  paroxetine starts working reduce the hydroxyzine to ONE every 8 hours  Your rash/ skin is better  Blood work today that was ordered 3/19  You are being referred for therapy once I get  a response from P Bynum, and also to Psychiatry  Go to ED if symptoms of depression, or anxiety worsen as we discussed please  Thanks for choosing [facility], we consider it a privelige to serve you.

## 2023-03-12 NOTE — Assessment & Plan Note (Signed)
Start paxil and 20 mg daily , refuses psych referral, not suicidal or homicidal and will seek help through ED if this changes

## 2023-03-12 NOTE — Assessment & Plan Note (Signed)
No bacterial superinfection Depomedrol 40 mg IM in office anf=d 5 day course of prednisone Hydroxyzine on schedule for itching

## 2023-03-13 LAB — CMP14+EGFR
ALT: 19 IU/L (ref 0–32)
AST: 17 IU/L (ref 0–40)
Albumin/Globulin Ratio: 1.6 (ref 1.2–2.2)
Albumin: 4.4 g/dL (ref 3.8–4.9)
Alkaline Phosphatase: 80 IU/L (ref 44–121)
BUN/Creatinine Ratio: 17 (ref 9–23)
BUN: 18 mg/dL (ref 6–24)
Bilirubin Total: 0.5 mg/dL (ref 0.0–1.2)
CO2: 22 mmol/L (ref 20–29)
Calcium: 9.4 mg/dL (ref 8.7–10.2)
Chloride: 102 mmol/L (ref 96–106)
Creatinine, Ser: 1.07 mg/dL — ABNORMAL HIGH (ref 0.57–1.00)
Globulin, Total: 2.8 g/dL (ref 1.5–4.5)
Glucose: 88 mg/dL (ref 70–99)
Potassium: 3.5 mmol/L (ref 3.5–5.2)
Sodium: 140 mmol/L (ref 134–144)
Total Protein: 7.2 g/dL (ref 6.0–8.5)
eGFR: 62 mL/min/{1.73_m2} (ref >59)

## 2023-03-13 LAB — LIPID PANEL
Chol/HDL Ratio: 3.1 ratio (ref 0.0–4.4)
Cholesterol, Total: 193 mg/dL (ref 100–199)
HDL: 62 mg/dL (ref >39)
LDL Chol Calc (NIH): 118 mg/dL — ABNORMAL HIGH (ref 0–99)
Triglycerides: 71 mg/dL (ref 0–149)
VLDL Cholesterol Cal: 13 mg/dL (ref 5–40)

## 2023-03-13 LAB — CBC
Hematocrit: 40.6 % (ref 34.0–46.6)
Hemoglobin: 13.8 g/dL (ref 11.1–15.9)
MCH: 30.9 pg (ref 26.6–33.0)
MCHC: 34 g/dL (ref 31.5–35.7)
MCV: 91 fL (ref 79–97)
Platelets: 315 10*3/uL (ref 150–450)
RBC: 4.47 x10E6/uL (ref 3.77–5.28)
RDW: 11.9 % (ref 11.7–15.4)
WBC: 4.4 10*3/uL (ref 3.4–10.8)

## 2023-03-13 LAB — HEMOGLOBIN A1C
Est. average glucose Bld gHb Est-mCnc: 123 mg/dL
Hgb A1c MFr Bld: 5.9 % — ABNORMAL HIGH (ref 4.8–5.6)

## 2023-03-13 LAB — TSH: TSH: 0.942 u[IU]/mL (ref 0.450–4.500)

## 2023-03-13 LAB — VITAMIN D 25 HYDROXY (VIT D DEFICIENCY, FRACTURES): Vit D, 25-Hydroxy: 29.7 ng/mL — ABNORMAL LOW (ref 30.0–100.0)

## 2023-03-16 NOTE — Assessment & Plan Note (Signed)
Marked improvement/ resolution, no need fo additional treament

## 2023-03-16 NOTE — Assessment & Plan Note (Signed)
Uncontrolled need sto take paxil as prescribed, she is also referred to therapy

## 2023-03-16 NOTE — Assessment & Plan Note (Signed)
Controlled, no change in medication DASH diet and commitment to daily physical activity for a minimum of 30 minutes discussed and encouraged, as a part of hypertension management. The importance of attaining a healthy weight is also discussed.     03/12/2023   10:02 AM 03/11/2023    3:27 PM 03/07/2023    9:53 AM 02/21/2023    9:25 AM 01/21/2023    9:17 AM 01/06/2023    9:26 AM 12/31/2022   11:14 AM  BP/Weight  Systolic BP 130 130 107 109 104 122 118  Diastolic BP 74 74 72 75 69 69 78  Wt. (Lbs) 153 153.04 160 160.2 161.12 165.6 163.5  BMI 24.69 kg/m2 24.7 kg/m2 25.82 kg/m2 25.86 kg/m2 26.01 kg/m2 26.73 kg/m2 26.39 kg/m2

## 2023-03-16 NOTE — Assessment & Plan Note (Signed)
Uncontrolled, not taking medication as prescribed advised on ccorect way to take it and she is to continue paxi

## 2023-03-16 NOTE — Progress Notes (Signed)
   Jane English     MRN: [id number]      DOB: August 14, 1971  Chief Complaint  Patient presents with   Follow-up    rash    HPI Ms. Dillingham is here having sent a my chart message that the rash she had been treated for one day ago, was indeed worse than pre treatment. She denies dever , chills or drainage from the rash C/o poor sleep and a lot of anxiety. Did not take medication as prescribed  as yet for anxiety , and wants therapy, not suicidal or homicidal ROS Denies recent fever or chills. Denies sinus pressure, nasal congestion, ear pain or sore throat. Denies chest congestion, productive cough or wheezing. Denies chest pains, palpitations and leg swelling Denies abdominal pain, nausea, vomiting,diarrhea or constipation.   Denies dysuria, frequency, hesitancy or incontinence. Denies joint pain, swelling and limitation in mobility. Denies headaches, seizures, numbness, or tingling.  PE  BP 130/74 (BP Location: Right Arm, Patient Position: Sitting, Cuff Size: Normal)   Pulse 89   Ht 5\' 6"  (1.676 m)   Wt 153 lb (69.4 kg)   SpO2 98%   BMI 24.69 kg/m   Patient alert and oriented and in no cardiopulmonary distress.  HEENT: No facial asymmetry, EOMI,     Neck supple .  Chest: Clear to auscultation bilaterally.  CVS: S1, S2 no murmurs, no S3.Regular rate.  ABD: Soft non tender.   Ext: No edema  MS: Adequate ROM spine, shoulders, hips and knees.  Skin: Intact, no ulcerations or rash noted.  Psych: Good eye contact, normal affect. Memory intact not anxious or depressed appearing.  CNS: CN 2-12 intact, power,  normal throughout.no focal deficits noted.   Assessment & Plan  GAD (generalized anxiety disorder) Uncontrolled, not taking medication as prescribed advised on ccorect way to take it and she is to continue paxi  Depression, major, single episode, severe (HCC) Uncontrolled need sto take paxil as prescribed, she is also referred to therapy  Essential  hypertension Controlled, no change in medication DASH diet and commitment to daily physical activity for a minimum of 30 minutes discussed and encouraged, as a part of hypertension management. The importance of attaining a healthy weight is also discussed.     03/12/2023   10:02 AM 03/11/2023    3:27 PM 03/07/2023    9:53 AM 02/21/2023    9:25 AM 01/21/2023    9:17 AM 01/06/2023    9:26 AM 12/31/2022   11:14 AM  BP/Weight  Systolic BP 130 130 107 109 104 122 118  Diastolic BP 74 74 72 75 69 69 78  Wt. (Lbs) 153 153.04 160 160.2 161.12 165.6 163.5  BMI 24.69 kg/m2 24.7 kg/m2 25.82 kg/m2 25.86 kg/m2 26.01 kg/m2 26.73 kg/m2 26.39 kg/m2       Dermatitis Marked improvement/ resolution, no need fo additional treament

## 2023-03-20 DIAGNOSIS — M961 Postlaminectomy syndrome, not elsewhere classified: Secondary | ICD-10-CM | POA: Diagnosis not present

## 2023-03-26 ENCOUNTER — Telehealth: Payer: Self-pay | Admitting: *Deleted

## 2023-03-26 NOTE — Telephone Encounter (Signed)
Urine drug screen for this encounter is consistent for prescribed medication 

## 2023-03-30 DIAGNOSIS — M961 Postlaminectomy syndrome, not elsewhere classified: Secondary | ICD-10-CM | POA: Diagnosis not present

## 2023-04-02 ENCOUNTER — Other Ambulatory Visit: Payer: Self-pay | Admitting: Family Medicine

## 2023-04-02 ENCOUNTER — Ambulatory Visit (HOSPITAL_COMMUNITY)
Admission: RE | Admit: 2023-04-02 | Discharge: 2023-04-02 | Disposition: A | Discharge status: Home / Self Care | Payer: Medicare HMO | Source: Ambulatory Visit | Attending: Family Medicine | Admitting: Family Medicine

## 2023-04-02 ENCOUNTER — Encounter (HOSPITAL_COMMUNITY): Payer: Self-pay

## 2023-04-02 DIAGNOSIS — N6310 Unspecified lump in the right breast, unspecified quadrant: Secondary | ICD-10-CM

## 2023-04-02 DIAGNOSIS — Z1231 Encounter for screening mammogram for malignant neoplasm of breast: Secondary | ICD-10-CM | POA: Insufficient documentation

## 2023-04-02 DIAGNOSIS — N63 Unspecified lump in unspecified breast: Secondary | ICD-10-CM

## 2023-04-03 ENCOUNTER — Other Ambulatory Visit: Payer: Self-pay | Admitting: Family Medicine

## 2023-04-07 ENCOUNTER — Ambulatory Visit (INDEPENDENT_AMBULATORY_CARE_PROVIDER_SITE_OTHER): Payer: Medicare HMO | Admitting: Family Medicine

## 2023-04-07 ENCOUNTER — Encounter: Payer: Self-pay | Admitting: Family Medicine

## 2023-04-07 VITALS — BP 95/61 | HR 97 | Ht 66.0 in | Wt 147.0 lb

## 2023-04-07 DIAGNOSIS — L739 Follicular disorder, unspecified: Secondary | ICD-10-CM | POA: Insufficient documentation

## 2023-04-07 MED ORDER — DOXYCYCLINE HYCLATE 100 MG PO TABS: 100.0000 mg | ORAL_TABLET | Freq: Two times a day (BID) | ORAL | 0 refills | Status: AC

## 2023-04-07 MED ORDER — BENZOYL PEROXIDE 5 % EX LIQD
Freq: Two times a day (BID) | CUTANEOUS | 12 refills | Status: AC
Start: 2023-04-07 — End: ?

## 2023-04-07 NOTE — Assessment & Plan Note (Signed)
Doxycyline 100 mg x 10 days  benzoyl peroxide 2 times daily x 5 days Advise patient to apply cold , wet cloth or ice pack to the scalp area  that itches. Continue Hydroxyzine 10 mg for itch relief  Referral to dermatology

## 2023-04-07 NOTE — Progress Notes (Signed)
Patient Office Visit   Subjective   Patient ID: Jane English, female    DOB: [birth year]  Age: 52 y.o. MRN: [id number]  CC:  Chief Complaint  Patient presents with   Rash    Patient complains of rash on scalp for a month.     HPI Jane English 52 year old female, presents to the clinic for folliculitis  She  has a past medical history of Anemia, Asthma, Asthma flare (04/09/2013), Back pain, Bronchitis, Chronic abdominal pain, Chronic constipation, Constipation due to opioid therapy, Depression, Depression, major, single episode, severe (HCC) (10/03/2018), Diabetes mellitus without complication (HCC), Diabetes mellitus, type II (HCC), DVT (deep venous thrombosis) (HCC) (2010), GERD (gastroesophageal reflux disease), Heart murmur, Helicobacter pylori gastritis (06/11/2013), Hypertension, IBS (irritable bowel syndrome), Migraine headache, Neuropathy, Obesity, Obsessive-compulsive disorder, PSYCHOTIC D/O W/HALLUCINATIONS CONDS CLASS ELSW (03/04/2010), PTSD (post-traumatic stress disorder), SBO (small bowel obstruction) (HCC) (08/09/2013), Seasonal allergies (12/10/2012), Seizures (HCC), and Shortness of breath.For the details of today's visit, please refer to assessment and plan.   HPI    Outpatient Encounter Medications as of 04/07/2023  Medication Sig   albuterol (PROVENTIL) (2.5 MG/3ML) 0.083% nebulizer solution USE 1 VIAL IN NEBULIZER EVERY 6 HOURS AS NEEDED FOR WHEEZING OR SHORTNESS OR BREATH   albuterol (VENTOLIN HFA) 108 (90 Base) MCG/ACT inhaler TAKE 2 PUFFS BY MOUTH EVERY 6 HOURS AS NEEDED FOR WHEEZE OR SHORTNESS OF BREATH   amitriptyline (ELAVIL) 10 MG tablet TAKE 1 TABLET BY MOUTH EVERYDAY AT BEDTIME   beclomethasone (QVAR REDIHALER) 40 MCG/ACT inhaler Inhale 2 puffs into the lungs 2 (two) times daily.   benzonatate (TESSALON) 200 MG capsule Take 1 capsule (200 mg total) by mouth 2 (two) times daily as needed for cough.   benzoyl peroxide 5 % external liquid Apply topically 2 (two) times  daily.   blood glucose meter kit and supplies Dispense based on patient and insurance preference. Once daily testing DX E11.9   BREO ELLIPTA 100-25 MCG/ACT AEPB INHALE 1 PUFF INTO THE LUNGS TWICE A DAY   ciprofloxacin-dexamethasone (CIPRODEX) OTIC suspension Place 4 drops in right ear two  times daily for 1 week   cloNIDine (CATAPRES) 0.1 MG tablet TAKE 1 TABLET BY MOUTH EVERYDAY AT BEDTIME   clotrimazole-betamethasone (LOTRISONE) cream APPLY 1 APPLICATION TOPICALLY TWICE A DAY   cromolyn (OPTICROM) 4 % ophthalmic solution Place 1 drop into both eyes 4 (four) times daily.   cyclobenzaprine (FLEXERIL) 5 MG tablet Take 1 tablet (5 mg total) by mouth 3 (three) times daily as needed for muscle spasms.   diclofenac Sodium (VOLTAREN) 1 % GEL APPLY 2 GRAMS TO AFFECTED AREA 4 TIMES A DAY   doxycycline (VIBRA-TABS) 100 MG tablet Take 1 tablet (100 mg total) by mouth 2 (two) times daily for 10 days.   EPINEPHrine 0.3 mg/0.3 mL IJ SOAJ injection as needed.   ergocalciferol (VITAMIN D2) 1.25 MG (50000 UT) capsule Take 1 capsule (50,000 Units total) by mouth once a week. One capsule once weekly   fluticasone (FLONASE) 50 MCG/ACT nasal spray PLACE 1-2 SPRAYS INTO BOTH NOSTRILS DAILY.   glucose blood test strip Use as instructed once daily dx e11.9   hydrOXYzine (ATARAX) 10 MG tablet Take 10 mg by mouth 3 (three) times daily as needed for anxiety.   hydrOXYzine (VISTARIL) 25 MG capsule TAKE 1 CAPSULE BY MOUTH 3 TIMES DAILY.   ipratropium (ATROVENT) 0.03 % nasal spray Place 2 sprays into both nostrils every 12 (twelve) hours.   Lancets 30G  MISC Once daily testing dx e11.9   lansoprazole (PREVACID) 30 MG capsule Take 1 capsule (30 mg total) by mouth daily.   lidocaine (XYLOCAINE) 2 % jelly    linaclotide (LINZESS) 290 MCG CAPS capsule TAKE 1 CAPSULE (290 MCG TOTAL) BY MOUTH DAILY BEFORE BREAKFAST.   lubiprostone (AMITIZA) 24 MCG capsule Take 1 capsule (24 mcg total) by mouth 2 (two) times daily with a meal.    magnesium oxide (MAG-OX) 400 MG tablet Take 1 tablet (400 mg total) by mouth 2 (two) times daily.   methocarbamol (ROBAXIN) 500 MG tablet Take 1 tablet (500 mg total) by mouth 4 (four) times daily.   montelukast (SINGULAIR) 10 MG tablet TAKE 1 TABLET BY MOUTH EVERYDAY AT BEDTIME   Na Sulfate-K Sulfate-Mg Sulf 17.5-3.13-1.6 GM/177ML SOLN Take as directed   NARCAN 4 MG/0.1ML LIQD nasal spray kit Place 4 mg into the nose as directed.   NONFORMULARY OR COMPOUNDED ITEM Apply 1-2 pumps, 3-4 times daily as needed.   ondansetron (ZOFRAN) 4 MG tablet Take 1 tablet (4 mg total) by mouth every 8 (eight) hours as needed for nausea or vomiting.   oxybutynin (DITROPAN-XL) 10 MG 24 hr tablet TAKE 1 TABLET BY MOUTH EVERYDAY AT BEDTIME   oxyCODONE (ROXICODONE) 5 MG immediate release tablet Take 2 tablets (10 mg total) by mouth 2 (two) times daily.   pantoprazole (PROTONIX) 40 MG tablet Take 1 tablet (40 mg total) by mouth daily.   PARoxetine (PAXIL) 10 MG tablet TAKE 1 TABLET BY MOUTH EVERY DAY   potassium chloride 20 MEQ/15ML (10%) SOLN Take 15 mLs (20 mEq total) by mouth 2 (two) times daily.   solifenacin (VESICARE) 5 MG tablet TAKE 1 TABLET (5 MG TOTAL) BY MOUTH DAILY.   terbinafine (LAMISIL) 250 MG tablet Take 1 tablet (250 mg total) by mouth daily.   Tiotropium Bromide Monohydrate (SPIRIVA RESPIMAT) 1.25 MCG/ACT AERS Inhale 2 puffs into the lungs daily.   tirzepatide (MOUNJARO) 7.5 MG/0.5ML Pen INJECT 7.5 MG SUBCUTANEOUSLY WEEKLY   topiramate (TOPAMAX) 25 MG tablet Take 1 tablet (25 mg total) by mouth 2 (two) times daily.   trolamine salicylate (ASPERCREME) 10 % cream Apply 1 application topically as needed for muscle pain.    [DISCONTINUED] sulfamethoxazole-trimethoprim (BACTRIM DS) 800-160 MG tablet Take 1 tablet by mouth 2 (two) times daily.   Facility-Administered Encounter Medications as of 04/07/2023  Medication   betamethasone acetate-betamethasone sodium phosphate (CELESTONE) injection 6 mg     Past Surgical History:  Procedure Laterality Date   ANTERIOR CERVICAL DECOMP/DISCECTOMY FUSION  07/07/2012   Procedure: ANTERIOR CERVICAL DECOMPRESSION/DISCECTOMY FUSION 2 LEVELS;  Surgeon: Karn Cassis, MD;  Location: [facility];  Service: Neurosurgery;  Laterality: N/A;  Cervical four-five, five - six  Anterior cervical decompression/diskectomy/fusion/plate   APPENDECTOMY  11/04/1984   BOWEL RESECTION N/A 07/29/2013   Procedure: serosal repair;  Surgeon: Ardeth Sportsman, MD;  Location: [facility];  Service: General;  Laterality: N/A;   CARPAL TUNNEL RELEASE Bilateral    COLON SURGERY N/A    Phreesia 07/29/2020   LAPAROSCOPY N/A 07/29/2013   Procedure: diagnostic laporoscopy;  Surgeon: Ardeth Sportsman, MD;  Location: [facility];  Service: General;  Laterality: N/A;   LAPAROSCOPY N/A 08/16/2013   Procedure: LAPAROSCOPY DIAGNOSTIC/LYSIS OF ADHESIONS;  Surgeon: Ardeth Sportsman, MD;  Location: [facility];  Service: General;  Laterality: N/A;   LAPAROTOMY N/A 08/16/2013   Procedure: EXPLORATORY LAPAROTOMY/SMALL BOWEL RESECTION (JEJUNUM);  Surgeon: Ardeth Sportsman, MD;  Location: [facility];  Service: General;  Laterality: N/A;   LUMBAR SPINE SURGERY  11/04/2008   x 3   LYSIS OF ADHESION  11/04/2001   Dr. Elpidio Anis   LYSIS OF ADHESION N/A 07/29/2013   Procedure: LYSIS OF ADHESION;  Surgeon: Ardeth Sportsman, MD;  Location: [facility];  Service: General;  Laterality: N/A;   OOPHORECTOMY     PARTIAL HYSTERECTOMY  1990s?   , [place]   SPINAL CORD STIMULATOR IMPLANT     spine stimulator removal  12/13/2022   SPINE SURGERY N/A    [facility]reesia 07/29/2020   TRIGGER FINGER RELEASE  11/05/2007   right pinkie finger   TUBAL LIGATION  11/04/1992    Review of Systems  Constitutional:  Negative for chills and fever.  Respiratory:  Negative for shortness of breath.   Cardiovascular:  Negative for chest pain.  Gastrointestinal:  Negative for abdominal pain.  Genitourinary:  Negative for dysuria.  Skin:   Positive for itching and rash.  Neurological:  Negative for dizziness and headaches.      Objective    BP 95/61   Pulse 97   Ht 5\' 6"  (1.676 m)   Wt 147 lb (66.7 kg)   SpO2 96%   BMI 23.73 kg/m   Physical Exam Vitals reviewed.  Constitutional:      General: She is not in acute distress.    Appearance: Normal appearance. She is not ill-appearing, toxic-appearing or diaphoretic.  HENT:     Head: Normocephalic.  Eyes:     General:        Right eye: No discharge.        Left eye: No discharge.     Conjunctiva/sclera: Conjunctivae normal.  Cardiovascular:     Rate and Rhythm: Normal rate.     Pulses: Normal pulses.     Heart sounds: Normal heart sounds.  Pulmonary:     Effort: Pulmonary effort is normal. No respiratory distress.     Breath sounds: Normal breath sounds.  Abdominal:     General: Bowel sounds are normal.     Palpations: Abdomen is soft.     Tenderness: There is no left CVA tenderness.  Musculoskeletal:        General: Normal range of motion.     Cervical back: Normal range of motion.  Skin:    General: Skin is warm and dry.     Capillary Refill: Capillary refill takes less than 2 seconds.     Findings: Rash present. Rash is scaling. Rash is not crusting, purpuric or urticarial.     Comments: Flat dry bumps  diffusely spread noted on scalp   Neurological:     General: No focal deficit present.     Mental Status: She is alert and oriented to person, place, and time.     Coordination: Coordination normal.     Gait: Gait normal.  Psychiatric:        Mood and Affect: Mood normal.        Behavior: Behavior normal.       Assessment & Plan:  Folliculitis Assessment & Plan: Doxycyline 100 mg x 10 days  benzoyl peroxide 2 times daily x 5 days Advise patient to apply cold , wet cloth or ice pack to the scalp area  that itches. Continue Hydroxyzine 10 mg for itch relief  Referral to dermatology   Orders: -     Doxycycline Hyclate; Take 1 tablet (100 mg  total) by mouth 2 (two) times daily for 10 days.  Dispense: 20 tablet; Refill: 0 -     Benzoyl Peroxide; Apply topically 2 (two) times daily.  Dispense: 142 g; Refill: 12 -     Ambulatory referral to Dermatology    Return if symptoms worsen or fail to improve.   Cruzita Lederer Newman Nip, FNP

## 2023-04-07 NOTE — Patient Instructions (Signed)
        Great to see you today.   - Please take medications as prescribed. - Follow up with your primary health provider if any health concerns arises. - If symptoms worsen please contact your primary care provider and/or visit the emergency department.  

## 2023-04-08 ENCOUNTER — Ambulatory Visit (HOSPITAL_COMMUNITY)
Admission: RE | Admit: 2023-04-08 | Discharge: 2023-04-08 | Disposition: A | Discharge status: Home / Self Care | Payer: Medicare HMO | Source: Ambulatory Visit | Attending: Family Medicine | Admitting: Family Medicine

## 2023-04-08 ENCOUNTER — Encounter: Payer: Medicare HMO | Attending: Physical Medicine and Rehabilitation | Admitting: Physical Medicine and Rehabilitation

## 2023-04-08 VITALS — BP 96/66 | HR 84 | Ht 66.0 in | Wt 148.0 lb

## 2023-04-08 DIAGNOSIS — N63 Unspecified lump in unspecified breast: Secondary | ICD-10-CM | POA: Insufficient documentation

## 2023-04-08 DIAGNOSIS — N6011 Diffuse cystic mastopathy of right breast: Secondary | ICD-10-CM | POA: Diagnosis not present

## 2023-04-08 DIAGNOSIS — F32A Depression, unspecified: Secondary | ICD-10-CM | POA: Diagnosis not present

## 2023-04-08 DIAGNOSIS — M961 Postlaminectomy syndrome, not elsewhere classified: Secondary | ICD-10-CM | POA: Diagnosis not present

## 2023-04-08 DIAGNOSIS — N6310 Unspecified lump in the right breast, unspecified quadrant: Secondary | ICD-10-CM | POA: Insufficient documentation

## 2023-04-08 DIAGNOSIS — L089 Local infection of the skin and subcutaneous tissue, unspecified: Secondary | ICD-10-CM | POA: Insufficient documentation

## 2023-04-08 DIAGNOSIS — R92323 Mammographic fibroglandular density, bilateral breasts: Secondary | ICD-10-CM | POA: Diagnosis not present

## 2023-04-08 MED ORDER — OXYCODONE HCL 5 MG PO TABS: 10.0000 mg | ORAL_TABLET | Freq: Two times a day (BID) | ORAL | 0 refills | Status: DC

## 2023-04-08 NOTE — Patient Instructions (Addendum)
Coconut Cult [employer]  Biotin  Praxair

## 2023-04-08 NOTE — Progress Notes (Signed)
B   Subjective:    Patient ID: Jane English, female    DOB: [birth year], 52 y.o.   MRN: [id number]  HPI Jane English is a 52 year old woman who presents for follow-up of failed back syndrome and history of obesity, now overweight  1) Overweight, history of obesity Continues to lose weight  2) Failed back syndrome: -needs refills of her oxycodone -has been using turmeric and collagen -needs refill of oxycodone and amitriptyline today -had stimulator removed and site has been hurting since then -stents removed as well.  -going back to see him on the 13th -no signs of infection -staples removed.  -returning to work today -needs refill of oxycodone  3) insomnia -continues to sleep poorly at night  4) depression -has been having some depression but pushes through  5) Pustules on right side of head -saw her PCP yesterday and was prescribed doxycycline -it dried her hair and it made her head itch -she was told it is caused by sweating -she asks why she got folliculiits  Pain Inventory Average Pain 9 Pain Right Now 7 My pain is intermittent, constant, sharp, burning, dull, stabbing, tingling, and aching  In the last 24 hours, has pain interfered with the following? General activity 9 Relation with others 9 Enjoyment of life 9 What TIME of day is your pain at its worst? morning , evening, and night Sleep (in general) Fair  Pain is worse with: walking, bending, sitting, inactivity, standing, and some activites Pain improves with: rest, heat/ice, and medication Relief from Meds: 8  Family History  Problem Relation Age of Onset   Physical abuse Mother    Alcohol abuse Mother    Cirrhosis Mother    Lung cancer Father    Stomach cancer Father    Esophageal cancer Father    Alcohol abuse Father    Mental illness Father    Diabetes Sister    Hypertension Sister    Bipolar disorder Sister    Schizophrenia Sister    Diabetes Sister    Drug abuse Sister    HIV Sister     Pneumonia Sister        died as a baby   Alcohol abuse Brother    Hypertension Brother    Kidney disease Brother    Diabetes Brother    Drug abuse Brother    Mental illness Brother    Alcohol abuse Brother    Alcohol abuse Brother    Hypertension Brother    Diabetes Brother    Alcohol abuse Brother    Mental illness Brother        in [place]   Alcohol abuse Brother    Alcohol abuse Brother    Bipolar disorder Brother    Hypertension Brother    Bipolar disorder Brother    Drug abuse Brother    Alcohol abuse Brother    Bipolar disorder Brother    Bipolar disorder Daughter    Bipolar disorder Son    Bipolar disorder Son    ADD / ADHD Neg Hx    Anxiety disorder Neg Hx    Dementia Neg Hx    Depression Neg Hx    OCD Neg Hx    Seizures Neg Hx    Paranoid behavior Neg Hx    Colon cancer Neg Hx    Rectal cancer Neg Hx    Social History   Socioeconomic History   Marital status: Married    Spouse name: Molly Maduro   Number of  children: 4   Years of education: 12   Highest education level: GED or equivalent  Occupational History   Occupation: disabled  Tobacco Use   Smoking status: Never   Smokeless tobacco: Never  Vaping Use   Vaping Use: Never used  Substance and Sexual Activity   Alcohol use: No   Drug use: No   Sexual activity: Not Currently    Partners: Male    Birth control/protection: Surgical    Comment: tubal  Other Topics Concern   Not on file  Social History Narrative   Not on file   Social Determinants of Health   Financial Resource Strain: Medium Risk (03/11/2023)   Overall Financial Resource Strain (CARDIA)    Difficulty of Paying Living Expenses: Somewhat hard  Food Insecurity: No Food Insecurity (03/11/2023)   Hunger Vital Sign    Worried About Running Out of Food in the Last Year: Never true    Ran Out of Food in the Last Year: Never true  Transportation Needs: No Transportation Needs (03/11/2023)   PRAPARE - Administrator, Civil Service  (Medical): No    Lack of Transportation (Non-Medical): No  Physical Activity: Unknown (03/11/2023)   Exercise Vital Sign    Days of Exercise per Week: Patient declined    Minutes of Exercise per Session: Not on file  Stress: No Stress Concern Present (03/11/2023)   [employer] of Occupational Health - Occupational Stress Questionnaire    Feeling of Stress : Only a little  Social Connections: Socially Integrated (03/11/2023)   Social Connection and Isolation Panel [NHANES]    Frequency of Communication with Friends and Family: More than three times a week    Frequency of Social Gatherings with Friends and Family: Patient declined    Attends Religious Services: More than 4 times per year    Active Member of Clubs or Organizations: Yes    Attends Banker Meetings: 1 to 4 times per year    Marital Status: Married   Past Surgical History:  Procedure Laterality Date   ANTERIOR CERVICAL DECOMP/DISCECTOMY FUSION  07/07/2012   Procedure: ANTERIOR CERVICAL DECOMPRESSION/DISCECTOMY FUSION 2 LEVELS;  Surgeon: Karn Cassis, MD;  Location: [facility];  Service: Neurosurgery;  Laterality: N/A;  Cervical four-five, five - six  Anterior cervical decompression/diskectomy/fusion/plate   APPENDECTOMY  11/04/1984   BOWEL RESECTION N/A 07/29/2013   Procedure: serosal repair;  Surgeon: Ardeth Sportsman, MD;  Location: [facility];  Service: General;  Laterality: N/A;   CARPAL TUNNEL RELEASE Bilateral    COLON SURGERY N/A    [facility]reesia 07/29/2020   LAPAROSCOPY N/A 07/29/2013   Procedure: diagnostic laporoscopy;  Surgeon: Ardeth Sportsman, MD;  Location: [facility];  Service: General;  Laterality: N/A;   LAPAROSCOPY N/A 08/16/2013   Procedure: LAPAROSCOPY DIAGNOSTIC/LYSIS OF ADHESIONS;  Surgeon: Ardeth Sportsman, MD;  Location: [facility];  Service: General;  Laterality: N/A;   LAPAROTOMY N/A 08/16/2013   Procedure: EXPLORATORY LAPAROTOMY/SMALL BOWEL RESECTION (JEJUNUM);  Surgeon: Ardeth Sportsman, MD;   Location: [facility];  Service: General;  Laterality: N/A;   LUMBAR SPINE SURGERY  11/04/2008   x 3   LYSIS OF ADHESION  11/04/2001   Dr. Elpidio Anis   LYSIS OF ADHESION N/A 07/29/2013   Procedure: LYSIS OF ADHESION;  Surgeon: Ardeth Sportsman, MD;  Location: [facility];  Service: General;  Laterality: N/A;   OOPHORECTOMY     PARTIAL HYSTERECTOMY  1990s?   [place], [place]   SPINAL  CORD STIMULATOR IMPLANT     spine stimulator removal  12/13/2022   SPINE SURGERY N/A    [facility]reesia 07/29/2020   TRIGGER FINGER RELEASE  11/05/2007   right pinkie finger   TUBAL LIGATION  11/04/1992   Past Surgical History:  Procedure Laterality Date   ANTERIOR CERVICAL DECOMP/DISCECTOMY FUSION  07/07/2012   Procedure: ANTERIOR CERVICAL DECOMPRESSION/DISCECTOMY FUSION 2 LEVELS;  Surgeon: Karn Cassis, MD;  Location: [facility];  Service: Neurosurgery;  Laterality: N/A;  Cervical four-five, five - six  Anterior cervical decompression/diskectomy/fusion/plate   APPENDECTOMY  11/04/1984   BOWEL RESECTION N/A 07/29/2013   Procedure: serosal repair;  Surgeon: Ardeth Sportsman, MD;  [facility]: [facility];  Service: General;  Laterality: N/A;   CARPAL TUNNEL RELEASE Bilateral    COLON SURGERY N/A    [facility]reesia 07/29/2020   LAPAROSCOPY N/A 07/29/2013   Procedure: diagnostic laporoscopy;  Surgeon: Ardeth Sportsman, MD;  [facility]: [facility];  Service: General;  Laterality: N/A;   LAPAROSCOPY N/A 08/16/2013   Procedure: LAPAROSCOPY DIAGNOSTIC/LYSIS OF ADHESIONS;  Surgeon: Ardeth Sportsman, MD;  [facility];  Service: General;  Laterality: N/A;   LAPAROTOMY N/A 08/16/2013   Procedure: EXPLORATORY LAPAROTOMY/SMALL BOWEL RESECTION (JEJUNUM);  Surgeon: Ardeth Sportsman, MD;  [facility];  Service: General;  Laterality: N/A;   LUMBAR SPINE SURGERY  11/04/2008   x 3   LYSIS OF ADHESION  11/04/2001   Dr. Elpidio Anis   LYSIS OF ADHESION N/A 07/29/2013   Procedure: LYSIS OF ADHESION;  Surgeon: Ardeth Sportsman, MD;  Location[facility];   Service: General;  Laterality: N/A;   OOPHORECTOMY     PARTIAL HYSTERECTOMY  1990s?   [place], [place]   SPINAL CORD STIMULATOR IMPLANT     spine stimulator removal  12/13/2022   SPINE SURGERY N/A    [facility]reesia 07/29/2020   TRIGGER FINGER RELEASE  11/05/2007   right pinkie finger   TUBAL LIGATION  11/04/1992   Past Medical History:  Diagnosis Date   Anemia    Asthma    Asthma flare 04/09/2013   Back pain    Bronchitis    Chronic abdominal pain    Chronic constipation    Constipation due to opioid therapy    Depression    Depression, major, single episode, severe (HCC) 10/03/2018   PHQ 9 score of 15   Diabetes mellitus without complication (HCC)    Diabetes mellitus, type II (HCC)    DVT (deep venous thrombosis) (HCC) 2010   GERD (gastroesophageal reflux disease)    Heart murmur    no cardiologist   Helicobacter pylori gastritis 06/11/2013   Colonoscopy Dr. Rhea Belton   Hypertension    IBS (irritable bowel syndrome)    Migraine headache    Neuropathy    Obesity    Obsessive-compulsive disorder    PSYCHOTIC D/O W/HALLUCINATIONS CONDS CLASS ELSW 03/04/2010   Qualifier: Diagnosis of  By: Lodema Hong MD, Margaret     PTSD (post-traumatic stress disorder)    SBO (small bowel obstruction) (HCC) 08/09/2013   Seasonal allergies 12/10/2012   Seizures (HCC)    Shortness of breath    BP 96/66   Pulse 84   Ht 5\' 6"  (1.676 m)   Wt 148 lb (67.1 kg)   SpO2 96%   BMI 23.89 kg/m   Opioid Risk Score:   Fall Risk Score:  `1  Depression screen [facility] 2/9     04/08/2023    9:19 AM 04/07/2023    2:05  PM 03/11/2023    4:03 PM 03/07/2023    9:58 AM 01/21/2023    9:19 AM 11/11/2022    3:17 PM 10/15/2022   10:05 AM  Depression screen PHQ 2/9  Decreased Interest 0 2 3 0 2 1 0  Down, Depressed, Hopeless 0 1 3 0 2 1 0  PHQ - 2 Score 0 3 6 0 4 2 0  Altered sleeping  1 3  2     Tired, decreased energy  2 3  2     Change in appetite  2 3  0    Feeling bad or failure about yourself   1   1    Trouble  concentrating  0 3  0    Moving slowly or fidgety/restless  0 1  0    Suicidal thoughts  0 0  0    PHQ-9 Score  9 19  9     Difficult doing work/chores  Somewhat difficult   Not difficult at all       Review of Systems  Musculoskeletal:  Positive for back pain.       Bilateral shoulder pain RT hip pain RT knee pain  All other systems reviewed and are negative.      Objective:   Physical Exam Gen: no distress, normal appearing, BMI 23.89 HEENT: oral mucosa pink and moist, NCAT Cardio: Reg rate Chest: normal effort, normal rate of breathing Abd: soft, non-distended Ext: no edema Psych: pleasant, normal affect Skin: intact Neuro: Alert and oriented x3, ambulating normally    Assessment & Plan:   1) Failed back syndrome -refilled oxycodone and amitriptyline -UDS monitored -encouraged follow-up with surgeon regarding pain after spinal cord stimulator removal -Provided with a pain relief journal and discussed that it contains foods and lifestyle tips to naturally help to improve pain. Discussed that these lifestyle strategies are also very good for health unlike some medications which can have negative side effects. Discussed that the act of keeping a journal can be therapeutic and helpful to realize patterns what helps to trigger and alleviate pain.  . -Discussed following foods that may reduce pain: 1) Ginger (especially studied for arthritis)- reduce leukotriene production to decrease inflammation 2) Blueberries- high in phytonutrients that decrease inflammation 3) Salmon- marine omega-3s reduce joint swelling and pain 4) Pumpkin seeds- reduce inflammation 5) dark chocolate- reduces inflammation 6) turmeric- reduces inflammation 7) tart cherries - reduce pain and stiffness 8) extra virgin olive oil - its compound olecanthal helps to block prostaglandins  9) chili peppers- can be eaten or applied topically via capsaicin 10) mint- helpful for headache, muscle aches, joint pain,  and itching 11) garlic- reduces inflammation  Link to further information on diet for chronic pain: [email or web address]    2) Overweight, history of obesity -discussed slight weight increase -provided dietary education -continue Mounjaro  3) Insomnia: -continue amitriptyline -Try to go outside near sunrise -Get exercise during the day.  -Turn off all devices an hour before bedtime.  -Teas that can benefit: chamomile, valerian root, Brahmi (Bacopa) -Can consider over the counter melatonin, magnesium, and/or L-theanine. Melatonin is an anti-oxidant with multiple health benefits. Magnesium is involved in greater than 300 enzymatic reactions in the body and most of [place] are deficient as our soil is often depleted. There are 7 different types of magnesium- Bioptemizer's is a supplement with all 7 types, and each has unique benefits. Magnesium can also help with constipation and anxiety.  -Pistachios naturally increase the production of melatonin -[employer] bamboo  bed sheets are free from toxic chemicals.  -Tart cherry juice or a tart cherry supplement can improve sleep and soreness post-workout    4) Depression: -discussed plan to move in with her son at [facility]. -discussed her resilience -discussed that her husband wants to come back and she is ok with this -discussed that she does not agree with leaving someone just because you have a disgreement  5)  Folliculitis: -continue doxyclycine, discussed how this can negatively affect the gut microbiome -recommended biotin -recommended rosemary -continue peppermint and tea tree oil

## 2023-04-15 ENCOUNTER — Other Ambulatory Visit: Payer: Self-pay | Admitting: Internal Medicine

## 2023-04-17 ENCOUNTER — Telehealth: Payer: Self-pay | Admitting: Family Medicine

## 2023-04-17 NOTE — Telephone Encounter (Signed)
Patient aware.

## 2023-04-17 NOTE — Telephone Encounter (Signed)
Pt called in wants to see if provider can send in something to spike appetite. Patient states that she has not been eating / can't eat anything. Wants a call back in regard.  Appt scheduled 6/18

## 2023-04-20 DIAGNOSIS — M961 Postlaminectomy syndrome, not elsewhere classified: Secondary | ICD-10-CM | POA: Diagnosis not present

## 2023-04-22 ENCOUNTER — Ambulatory Visit (INDEPENDENT_AMBULATORY_CARE_PROVIDER_SITE_OTHER): Payer: Medicare HMO | Admitting: Family Medicine

## 2023-04-22 ENCOUNTER — Encounter: Payer: Self-pay | Admitting: Family Medicine

## 2023-04-22 VITALS — BP 120/82 | HR 103 | Ht 66.0 in | Wt 147.0 lb

## 2023-04-22 DIAGNOSIS — L299 Pruritus, unspecified: Secondary | ICD-10-CM | POA: Diagnosis not present

## 2023-04-22 DIAGNOSIS — F32A Depression, unspecified: Secondary | ICD-10-CM | POA: Diagnosis not present

## 2023-04-22 DIAGNOSIS — I10 Essential (primary) hypertension: Secondary | ICD-10-CM

## 2023-04-22 DIAGNOSIS — G894 Chronic pain syndrome: Secondary | ICD-10-CM

## 2023-04-22 DIAGNOSIS — E1169 Type 2 diabetes mellitus with other specified complication: Secondary | ICD-10-CM

## 2023-04-22 DIAGNOSIS — F419 Anxiety disorder, unspecified: Secondary | ICD-10-CM | POA: Diagnosis not present

## 2023-04-22 NOTE — Patient Instructions (Addendum)
Annual exam in 6 to 8 weeks, call if you need me before.  Reduce dose of Mounjaro to 5 mg and inject once every 2 weeks.  If symptoms of nausea and poor appetite recur then stop Mounjaro entirely get in touch.  Please start Paxil 20 mg 1 daily for depression and anxiety.  Please continue hydroxyzine as you are currently taking it.  Please bring all medications to your next visit.  I will be in touch with psychology regarding referral for counseling, I do believe this will help   HBA1C cmp and eGFR 3 days before next appouintment

## 2023-04-24 ENCOUNTER — Telehealth: Payer: Self-pay | Admitting: Family Medicine

## 2023-04-24 MED ORDER — TIRZEPATIDE 5 MG/0.5ML ~~LOC~~ SOAJ: 5.0000 mg | SUBCUTANEOUS | 0 refills | Status: DC

## 2023-04-24 NOTE — Telephone Encounter (Signed)
Needs tirzepatide [facility]) 5 MG/0.5ML Pen Sent in  Patient was incorrect does not have this dosage in fridge at home has the 7.5 and cannot take

## 2023-04-24 NOTE — Telephone Encounter (Signed)
Patient aware.

## 2023-04-27 NOTE — Assessment & Plan Note (Signed)
Improved continue hydroxyzine

## 2023-04-27 NOTE — Assessment & Plan Note (Signed)
Managed by pain clinic and controled

## 2023-04-27 NOTE — Progress Notes (Signed)
Jane English     MRN: [id number]      DOB: 08/11/1971  Chief Complaint  Patient presents with   Follow-up    Follow up nausea dry heaving losing weight lab review    HPI Jane English is here for follow up and re-evaluation of chronic medical conditions, medication management and review of any available recent lab and radiology data.  Preventive health is updated, specifically  Cancer screening and Immunization.   Has above complaints as stated by nursing   ROS Denies recent fever or chills. Denies sinus pressure, nasal congestion, ear pain or sore throat. Denies chest congestion, productive cough or wheezing. Denies chest pains, palpitations and leg swelling .   Denies dysuria, frequency, hesitancy or incontinence. Denies joint pain, swelling and limitation in mobility. Denies headaches, seizures, numbness, or tingling. 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   PE  BP 120/82   Pulse (!) 103   Ht 5\' 6"  (1.676 m)   Wt 147 lb 0.6 oz (66.7 kg)   SpO2 97%   BMI 23.73 kg/m   Patient alert and oriented and in no cardiopulmonary distress.  HEENT: No facial asymmetry, EOMI,     Neck supple .  Chest: Clear to auscultation bilaterally.  CVS: S1, S2 no murmurs, no S3.Regular rate.  ABD: Soft non tender.   Ext: No edema  MS: Adequate ROM spine, shoulders, hips and knees.  Skin: Intact, no ulcerations or rash noted.  Psych: Good eye contact, flat  affect. Depressed appearing CNS: CN 2-12 intact, power,  normal throughout.no focal deficits noted.   Assessment & Plan  Anxiety and depression Start  daily paxil  Chronic pain syndrome Managed by pain clinic and controled  Type 2 diabetes mellitus with other specified complication (HCC) Jane English is reminded of the  importance of commitment to daily physical activity for 30 minutes or more, as able and the need to limit carbohydrate intake to 30 to 60 grams per meal to help with blood sugar control.   The need to take medication as prescribed, test blood sugar as directed, and to call between visits if there is a concern that blood sugar is uncontrolled is also discussed.   Jane English is reminded of the importance of daily foot exam, annual eye examination, and good blood sugar, blood pressure and cholesterol control.     Latest Ref Rng & Units 03/12/2023   10:57 AM 09/23/2022    8:36 AM 09/20/2022    9:53 AM 06/05/2022    8:45 PM 03/21/2022   12:13 PM  Diabetic Labs  HbA1c 4.8 - 5.6 % 5.9  5.9    6.7   Micro/Creat Ratio 0 - 29 mg/g creat   3     Chol 100 - 199 mg/dL 409  811    914   HDL >78 mg/dL 62  58    50   Calc LDL 0 - 99 mg/dL 295  88    94   Triglycerides 0 - 149 mg/dL 71  621    308   Creatinine 0.57 - 1.00 mg/dL 6.57  8.46   9.62  9.52       04/22/2023    3:29 PM 04/22/2023    2:58 PM 04/08/2023  9:11 AM 04/07/2023    2:04 PM 03/12/2023   10:02 AM 03/11/2023    3:27 PM 03/07/2023    9:53 AM  BP/Weight  Systolic BP 120 127 96 95 130 536 107  Diastolic BP 82 85 66 61 74 74 72  Wt. (Lbs)  147.04 148 147 153 153.04 160  BMI  23.73 kg/m2 23.89 kg/m2 23.73 kg/m2 24.69 kg/m2 24.7 kg/m2 25.82 kg/m2      Latest Ref Rng & Units 08/15/2022   12:00 AM 03/21/2022   10:40 AM  Foot/eye exam completion dates  Eye Exam No Retinopathy No Retinopathy       Foot Form Completion   Done     This result is from an external source.      Reduce dose mounjaro, has poor appetite with weight loss and diabetes controlled and weight is at goal, take 5 mg alternate weeks , if able to tolerate, states she has that dose in her fridge, later called to say none pt at home, weaning off this medciation is appropriate and the goal  Pruritus Improved continue hydroxyzine

## 2023-04-27 NOTE — Assessment & Plan Note (Signed)
Jane English is reminded of the importance of commitment to daily physical activity for 30 minutes or more, as able and the need to limit carbohydrate intake to 30 to 60 grams per meal to help with blood sugar control.   The need to take medication as prescribed, test blood sugar as directed, and to call between visits if there is a concern that blood sugar is uncontrolled is also discussed.   Jane English is reminded of the importance of daily foot exam, annual eye examination, and good blood sugar, blood pressure and cholesterol control.     Latest Ref Rng & Units 03/12/2023   10:57 AM 09/23/2022    8:36 AM 09/20/2022    9:53 AM 06/05/2022    8:45 PM 03/21/2022   12:13 PM  Diabetic Labs  HbA1c 4.8 - 5.6 % 5.9  5.9    6.7   Micro/Creat Ratio 0 - 29 mg/g creat   3     Chol 100 - 199 mg/dL 409  811    914   HDL >78 mg/dL 62  58    50   Calc LDL 0 - 99 mg/dL 295  88    94   Triglycerides 0 - 149 mg/dL 71  621    308   Creatinine 0.57 - 1.00 mg/dL 6.57  8.46   9.62  9.52       04/22/2023    3:29 PM 04/22/2023    2:58 PM 04/08/2023    9:11 AM 04/07/2023    2:04 PM 03/12/2023   10:02 AM 03/11/2023    3:27 PM 03/07/2023    9:53 AM  BP/Weight  Systolic BP 120 127 96 95 130 841 107  Diastolic BP 82 85 66 61 74 74 72  Wt. (Lbs)  147.04 148 147 153 153.04 160  BMI  23.73 kg/m2 23.89 kg/m2 23.73 kg/m2 24.69 kg/m2 24.7 kg/m2 25.82 kg/m2      Latest Ref Rng & Units 08/15/2022   12:00 AM 03/21/2022   10:40 AM  Foot/eye exam completion dates  Eye Exam No Retinopathy No Retinopathy       Foot Form Completion   Done     This result is from an external source.      Reduce dose mounjaro, has poor appetite with weight loss and diabetes controlled and weight is at goal, take 5 mg alternate weeks , if able to tolerate, states she has that dose in her fridge, later called to say none pt at home, weaning off this medciation is appropriate and the goal

## 2023-04-27 NOTE — Assessment & Plan Note (Signed)
Start  daily paxil

## 2023-04-29 ENCOUNTER — Ambulatory Visit: Payer: Medicare HMO | Admitting: Family Medicine

## 2023-04-30 DIAGNOSIS — M961 Postlaminectomy syndrome, not elsewhere classified: Secondary | ICD-10-CM | POA: Diagnosis not present

## 2023-05-07 ENCOUNTER — Ambulatory Visit: Payer: Medicare HMO | Admitting: Registered Nurse

## 2023-05-07 ENCOUNTER — Encounter: Payer: Self-pay | Admitting: Registered Nurse

## 2023-05-07 ENCOUNTER — Encounter: Payer: Medicare HMO | Attending: Physical Medicine and Rehabilitation | Admitting: Registered Nurse

## 2023-05-07 VITALS — BP 111/74 | HR 81 | Ht 66.0 in | Wt 153.4 lb

## 2023-05-07 DIAGNOSIS — M25511 Pain in right shoulder: Secondary | ICD-10-CM | POA: Insufficient documentation

## 2023-05-07 DIAGNOSIS — M961 Postlaminectomy syndrome, not elsewhere classified: Secondary | ICD-10-CM | POA: Diagnosis not present

## 2023-05-07 DIAGNOSIS — M5416 Radiculopathy, lumbar region: Secondary | ICD-10-CM | POA: Diagnosis not present

## 2023-05-07 DIAGNOSIS — Z79899 Other long term (current) drug therapy: Secondary | ICD-10-CM | POA: Diagnosis not present

## 2023-05-07 DIAGNOSIS — M25512 Pain in left shoulder: Secondary | ICD-10-CM | POA: Insufficient documentation

## 2023-05-07 DIAGNOSIS — G8929 Other chronic pain: Secondary | ICD-10-CM | POA: Diagnosis not present

## 2023-05-07 DIAGNOSIS — Z5181 Encounter for therapeutic drug level monitoring: Secondary | ICD-10-CM | POA: Insufficient documentation

## 2023-05-07 DIAGNOSIS — G894 Chronic pain syndrome: Secondary | ICD-10-CM | POA: Insufficient documentation

## 2023-05-07 MED ORDER — OXYCODONE HCL 5 MG PO TABS: 10.0000 mg | ORAL_TABLET | Freq: Two times a day (BID) | ORAL | 0 refills | Status: DC

## 2023-05-07 NOTE — Progress Notes (Signed)
Subjective:    Patient ID: Jane English, female    DOB: 18-Oct-1971, 52 y.o.   MRN: [id number]  HPI: Jane English is a 52 y.o. female who returns for follow up appointment for chronic pain and medication refill. She states her pain is located in her bilateral shoulders and lower back pain and radiating into her right lower extremity. She rates her pain 7. Her current exercise regime is walking and performing stretching exercises.  Ms. Clinebell Morphine equivalent is 30.00 MME.   Last UDS was Performed on 03/07/2023, it was consistent.     Pain Inventory Average Pain 9 Pain Right Now 7 My pain is sharp, burning, dull, stabbing, tingling, and aching  In the last 24 hours, has pain interfered with the following? General activity 8 Relation with others 8 Enjoyment of life 8 What TIME of day is your pain at its worst? morning , evening, and night Sleep (in general) Poor  Pain is worse with: walking, bending, sitting, standing, and some activites Pain improves with: rest, heat/ice, and medication Relief from Meds: 9  Family History  Problem Relation Age of Onset   Physical abuse Mother    Alcohol abuse Mother    Cirrhosis Mother    Lung cancer Father    Stomach cancer Father    Esophageal cancer Father    Alcohol abuse Father    Mental illness Father    Diabetes Sister    Hypertension Sister    Bipolar disorder Sister    Schizophrenia Sister    Diabetes Sister    Drug abuse Sister    HIV Sister    Pneumonia Sister        died as a baby   Alcohol abuse Brother    Hypertension Brother    Kidney disease Brother    Diabetes Brother    Drug abuse Brother    Mental illness Brother    Alcohol abuse Brother    Alcohol abuse Brother    Hypertension Brother    Diabetes Brother    Alcohol abuse Brother    Mental illness Brother        in [place]   Alcohol abuse Brother    Alcohol abuse Brother    Bipolar disorder Brother    Hypertension Brother    Bipolar disorder Brother     Drug abuse Brother    Alcohol abuse Brother    Bipolar disorder Brother    Bipolar disorder Daughter    Bipolar disorder Son    Bipolar disorder Son    ADD / ADHD Neg Hx    Anxiety disorder Neg Hx    Dementia Neg Hx    Depression Neg Hx    OCD Neg Hx    Seizures Neg Hx    Paranoid behavior Neg Hx    Colon cancer Neg Hx    Rectal cancer Neg Hx    Social History   Socioeconomic History   Marital status: Married    Spouse name: Molly Maduro   Number of children: 4   Years of education: 12   Highest education level: GED or equivalent  Occupational History   Occupation: disabled  Tobacco Use   Smoking status: Never   Smokeless tobacco: Never  Vaping Use   Vaping Use: Never used  Substance and Sexual Activity   Alcohol use: No   Drug use: No   Sexual activity: Not Currently    Partners: Male    Birth control/protection: Surgical  Comment: tubal  Other Topics Concern   Not on file  Social History Narrative   Not on file   Social Determinants of Health   Financial Resource Strain: Medium Risk (03/11/2023)   Overall Financial Resource Strain (CARDIA)    Difficulty of Paying Living Expenses: Somewhat hard  Food Insecurity: No Food Insecurity (03/11/2023)   Hunger Vital Sign    Worried About Running Out of Food in the Last Year: Never true    Ran Out of Food in the Last Year: Never true  Transportation Needs: No Transportation Needs (03/11/2023)   PRAPARE - Administrator, Civil Service (Medical): No    Lack of Transportation (Non-Medical): No  Physical Activity: Unknown (03/11/2023)   Exercise Vital Sign    Days of Exercise per Week: Patient declined    Minutes of Exercise per Session: Not on file  Stress: No Stress Concern Present (03/11/2023)   [employer] of Occupational Health - Occupational Stress Questionnaire    Feeling of Stress : Only a little  Social Connections: Socially Integrated (03/11/2023)   Social Connection and Isolation Panel [NHANES]     Frequency of Communication with Friends and Family: More than three times a week    Frequency of Social Gatherings with Friends and Family: Patient declined    Attends Religious Services: More than 4 times per year    Active Member of Clubs or Organizations: Yes    Attends Banker Meetings: 1 to 4 times per year    Marital Status: Married   Past Surgical History:  Procedure Laterality Date   ANTERIOR CERVICAL DECOMP/DISCECTOMY FUSION  07/07/2012   Procedure: ANTERIOR CERVICAL DECOMPRESSION/DISCECTOMY FUSION 2 LEVELS;  Surgeon: Karn Cassis, MD;  Location: [facility];  Service: Neurosurgery;  Laterality: N/A;  Cervical four-five, five - six  Anterior cervical decompression/diskectomy/fusion/plate   APPENDECTOMY  11/04/1984   BOWEL RESECTION N/A 07/29/2013   Procedure: serosal repair;  Surgeon: Ardeth Sportsman, MD;  Location: [facility];  Service: General;  Laterality: N/A;   CARPAL TUNNEL RELEASE Bilateral    COLON SURGERY N/A    Phreesia 07/29/2020   LAPAROSCOPY N/A 07/29/2013   Procedure: diagnostic laporoscopy;  Surgeon: Ardeth Sportsman, MD;  Location: [facility];  Service: General;  Laterality: N/A;   LAPAROSCOPY N/A 08/16/2013   Procedure: LAPAROSCOPY DIAGNOSTIC/LYSIS OF ADHESIONS;  Surgeon: Ardeth Sportsman, MD;  Location: [facility];  Service: General;  Laterality: N/A;   LAPAROTOMY N/A 08/16/2013   Procedure: EXPLORATORY LAPAROTOMY/SMALL BOWEL RESECTION (JEJUNUM);  Surgeon: Ardeth Sportsman, MD;  Location: [facility];  Service: General;  Laterality: N/A;   LUMBAR SPINE SURGERY  11/04/2008   x 3   LYSIS OF ADHESION  11/04/2001   Dr. Elpidio Anis   LYSIS OF ADHESION N/A 07/29/2013   Procedure: LYSIS OF ADHESION;  Surgeon: Ardeth Sportsman, MD;  Location: [facility];  Service: General;  Laterality: N/A;   OOPHORECTOMY     PARTIAL HYSTERECTOMY  1990s?   [place], [place]   SPINAL CORD STIMULATOR IMPLANT     spine stimulator removal  12/13/2022   SPINE SURGERY N/A    Phreesia 07/29/2020    TRIGGER FINGER RELEASE  11/05/2007   right pinkie finger   TUBAL LIGATION  11/04/1992   Past Surgical History:  Procedure Laterality Date   ANTERIOR CERVICAL DECOMP/DISCECTOMY FUSION  07/07/2012   Procedure: ANTERIOR CERVICAL DECOMPRESSION/DISCECTOMY FUSION 2 LEVELS;  Surgeon: Karn Cassis, MD;  Location: [facility];  Service: Neurosurgery;  Laterality: N/A;  Cervical four-five, five - six  Anterior cervical decompression/diskectomy/fusion/plate   APPENDECTOMY  11/04/1984   BOWEL RESECTION N/A 07/29/2013   Procedure: serosal repair;  Surgeon: Ardeth Sportsman, MD;  Location: [facility];  Service: General;  Laterality: N/A;   CARPAL TUNNEL RELEASE Bilateral    COLON SURGERY N/A    Phreesia 07/29/2020   LAPAROSCOPY N/A 07/29/2013   Procedure: diagnostic laporoscopy;  Surgeon: Ardeth Sportsman, MD;  Location: [facility];  Service: General;  Laterality: N/A;   LAPAROSCOPY N/A 08/16/2013   Procedure: LAPAROSCOPY DIAGNOSTIC/LYSIS OF ADHESIONS;  Surgeon: Ardeth Sportsman, MD;  Location: [facility];  Service: General;  Laterality: N/A;   LAPAROTOMY N/A 08/16/2013   Procedure: EXPLORATORY LAPAROTOMY/SMALL BOWEL RESECTION (JEJUNUM);  Surgeon: Ardeth Sportsman, MD;  Location: [facility];  Service: General;  Laterality: N/A;   LUMBAR SPINE SURGERY  11/04/2008   x 3   LYSIS OF ADHESION  11/04/2001   Dr. Elpidio Anis   LYSIS OF ADHESION N/A 07/29/2013   Procedure: LYSIS OF ADHESION;  Surgeon: Ardeth Sportsman, MD;  Location: [facility];  Service: General;  Laterality: N/A;   OOPHORECTOMY     PARTIAL HYSTERECTOMY  1990s?   [place], [place]   SPINAL CORD STIMULATOR IMPLANT     spine stimulator removal  12/13/2022   SPINE SURGERY N/A    Phreesia 07/29/2020   TRIGGER FINGER RELEASE  11/05/2007   right pinkie finger   TUBAL LIGATION  11/04/1992   Past Medical History:  Diagnosis Date   Anemia    Asthma    Asthma flare 04/09/2013   Back pain    Bronchitis    Chronic abdominal pain    Chronic constipation     Constipation due to opioid therapy    Depression    Depression, major, single episode, severe (HCC) 10/03/2018   PHQ 9 score of 15   Diabetes mellitus without complication (HCC)    Diabetes mellitus, type II (HCC)    DVT (deep venous thrombosis) (HCC) 2010   GERD (gastroesophageal reflux disease)    Heart murmur    no cardiologist   Helicobacter pylori gastritis 06/11/2013   Colonoscopy Dr. Rhea Belton   Hypertension    IBS (irritable bowel syndrome)    Migraine headache    Neuropathy    Obesity    Obsessive-compulsive disorder    PSYCHOTIC D/O W/HALLUCINATIONS CONDS CLASS ELSW 03/04/2010   Qualifier: Diagnosis of  By: Lodema Hong MD, Margaret     PTSD (post-traumatic stress disorder)    SBO (small bowel obstruction) (HCC) 08/09/2013   Seasonal allergies 12/10/2012   Seizures (HCC)    Shortness of breath    BP 111/74   Pulse 81   Ht 5\' 6"  (1.676 m)   Wt 153 lb 6.4 oz (69.6 kg)   SpO2 98%   BMI 24.76 kg/m   Opioid Risk Score:   Fall Risk Score:  `1  Depression screen [facility] 2/9     05/07/2023    9:01 AM 04/22/2023    3:00 PM 04/08/2023    9:19 AM 04/07/2023    2:05 PM 03/11/2023    4:03 PM 03/07/2023    9:58 AM 01/21/2023    9:19 AM  Depression screen PHQ 2/9  Decreased Interest 1 2 0 2 3 0 2  Down, Depressed, Hopeless 1 2 0 1 3 0 2  PHQ - 2 Score 2 4 0 3 6 0 4  Altered sleeping  0  1 3  2  Tired, decreased energy  0  2 3  2   Change in appetite  0  2 3  0  Feeling bad or failure about yourself   2  1   1   Trouble concentrating  0  0 3  0  Moving slowly or fidgety/restless  0  0 1  0  Suicidal thoughts  0  0 0  0  PHQ-9 Score  6  9 19  9   Difficult doing work/chores  Not difficult at all  Somewhat difficult   Not difficult at all     Review of Systems  Constitutional: Negative.   HENT: Negative.    Eyes: Negative.   Respiratory: Negative.    Cardiovascular: Negative.   Gastrointestinal: Negative.   Endocrine: Negative.   Genitourinary: Negative.   Musculoskeletal:  Positive for  arthralgias.       Right knee and bilateral shoulder  Skin: Negative.   Allergic/Immunologic: Negative.   Neurological: Negative.   Hematological: Negative.   Psychiatric/Behavioral:  Positive for dysphoric mood.   All other systems reviewed and are negative.      Objective:   Physical Exam Vitals and nursing note reviewed.  Constitutional:      Appearance: Normal appearance.  Cardiovascular:     Rate and Rhythm: Normal rate and regular rhythm.     Pulses: Normal pulses.     Heart sounds: Normal heart sounds.  Pulmonary:     Effort: Pulmonary effort is normal.     Breath sounds: Normal breath sounds.  Musculoskeletal:     Cervical back: Normal range of motion and neck supple.     Comments: Normal Muscle Bulk and Muscle Testing Reveals:  Upper Extremities: Full ROM and Muscle Strength 5/5 Bilateral AC Joint Tenderness  Lumbar Paraspinal Tenderness: L-3-L-5 Lower Extremities: Full ROM and Muscle Strength 5/5 Arises from Table with ease Narrow Based  Gait     Skin:    General: Skin is warm and dry.  Neurological:     Mental Status: She is alert and oriented to person, place, and time.  Psychiatric:        Mood and Affect: Mood normal.        Behavior: Behavior normal.         Assessment & Plan:  1.Chronic Pain of Bilateral Shoulders L>R: Continue to alternate Ice and Heat therapy. Continue HEP as Tolerated. Continue to Monitor. 05/07/2023 2. Failed Back Syndrome of Lumbar Syndrome : Continue HEP as Tolerated. Continue current medication regimen. Continue to Monitor. 05/07/2023 3. Right Lumbar Radiculitis: Continue HEP as Tolerated. Continue to Monitor.05/07/2023 4. Left Knee Pain: No Complaints today. Continue HEP as Tolerated. Continue to Monitor.05/07/2023 5. Muscle Spasm of Shoulders/ Upper Back : Continue Flexeril. Continue to Monitor.05/07/2023 6. Chronic Pain Syndrome: Refilled: Oxycodone 5mg /325 mg two tablets twice a day #120. We will continue the opioid  monitoring program, this consists of regular clinic visits, examinations, urine drug screen, pill counts as well as use of [place] Controlled Substance Reporting system. A 12 month History has been reviewed on the [place] Controlled Substance Reporting System on 05/07/2023.   F/U in 1 month

## 2023-05-19 ENCOUNTER — Telehealth: Payer: Self-pay | Admitting: Family Medicine

## 2023-05-19 NOTE — Telephone Encounter (Signed)
[facility] called in on patient behalf  Wants to know if patient is taking a  statin for diabetes If not could they be on one. If allergic can states also.  Call back [phone]

## 2023-05-20 DIAGNOSIS — M961 Postlaminectomy syndrome, not elsewhere classified: Secondary | ICD-10-CM | POA: Diagnosis not present

## 2023-05-20 NOTE — Telephone Encounter (Signed)
Please advise 

## 2023-05-21 ENCOUNTER — Encounter: Payer: Self-pay | Admitting: Dermatology

## 2023-05-21 ENCOUNTER — Ambulatory Visit (INDEPENDENT_AMBULATORY_CARE_PROVIDER_SITE_OTHER): Payer: Medicare HMO | Admitting: Dermatology

## 2023-05-21 DIAGNOSIS — L659 Nonscarring hair loss, unspecified: Secondary | ICD-10-CM

## 2023-05-21 DIAGNOSIS — L234 Allergic contact dermatitis due to dyes: Secondary | ICD-10-CM | POA: Diagnosis not present

## 2023-05-21 DIAGNOSIS — L309 Dermatitis, unspecified: Secondary | ICD-10-CM

## 2023-05-21 MED ORDER — FLUOCINOLONE ACETONIDE BODY 0.01 % EX OIL
1.0000 | TOPICAL_OIL | CUTANEOUS | 5 refills | Status: DC
Start: 2023-05-21 — End: 2023-07-01

## 2023-05-21 MED ORDER — SAFETY SEAL MISCELLANEOUS MISC
1.0000 | Freq: Every day | 4 refills | Status: DC
Start: 2023-05-21 — End: 2024-03-17

## 2023-05-21 NOTE — Patient Instructions (Addendum)
Thank you for visiting my office today and discussing your concerns regarding your scalp condition. I appreciate your commitment to improving your health and am confident that the treatment plan we've outlined will help address your symptoms.  Here are the key instructions from today's consultation:  - Prescription Oil Treatment: Apply the prescribed oil with a steroid to your scalp overnight every two or three days. Use a shower cap and hair bonnet to protect your pillow. In the morning, wash it out with Pantene shampoo and follow with a deep conditioner.  - Medicated Drops: Use the drops containing clobetasol and minoxidil from a compounding pharmacy on your scalp every other morning for the first week. If there is no irritation, increase the application to every morning to help calm inflammation and promote hair growth.  - Viviscal Supplements: Take two tablets twice a day to support hair regrowth.  - Collagen Supplements: Start taking Vital Protein collagen supplements to strengthen your hair and reduce breakage.  - General Hair Care: Avoid strong drying shampoos and keep your hair natural. Avoid tight hairstyles or using hair dyes.  - Follow-Up Appointment: We will schedule a follow-up appointment in three months to assess the progress and make any necessary adjustments to your treatment plan.  Please remember that while the medication will start working immediately, visible changes may take up to three months to become apparent. I look forward to seeing you again and discussing your progress.            Due to recent changes in healthcare laws, you may see results of your pathology and/or laboratory studies on MyChart before the doctors have had a chance to review them. We understand that in some cases there may be results that are confusing or concerning to you. Please understand that not all results are received at the same time and often the doctors may need to interpret  multiple results in order to provide you with the best plan of care or course of treatment. Therefore, we ask that you please give [place] 2 business days to thoroughly review all your results before contacting the office for clarification. Should we see a critical lab result, you will be contacted sooner.   If You Need Anything After Your Visit  If you have any questions or concerns for your doctor, please call our main line at [phone] If no one answers, please leave a voicemail as directed and we will return your call as soon as possible. Messages left after 4 pm will be answered the following business day.   You may also send [place] a message via MyChart. We typically respond to MyChart messages within 1-2 business days.  For prescription refills, please ask your pharmacy to contact our office. Our fax number is [phone].  If you have an urgent issue when the clinic is closed that cannot wait until the next business day, you can page your doctor at the number below.    Please note that while we do our best to be available for urgent issues outside of office hours, we are not available 24/7.   If you have an urgent issue and are unable to reach [place], you may choose to seek medical care at your doctor's office, retail clinic, urgent care center, or emergency room.  If you have a medical emergency, please immediately call 911 or go to the emergency department. In the event of inclement weather, please call our main line at [phone] for an update on the status of  any delays or closures.  Dermatology Medication Tips: Please keep the boxes that topical medications come in in order to help keep track of the instructions about where and how to use these. Pharmacies typically print the medication instructions only on the boxes and not directly on the medication tubes.   If your medication is too expensive, please contact our office at [phone] or send [place] a message through MyChart.   We are  unable to tell what your co-pay for medications will be in advance as this is different depending on your insurance coverage. However, we may be able to find a substitute medication at lower cost or fill out paperwork to get insurance to cover a needed medication.   If a prior authorization is required to get your medication covered by your insurance company, please allow [place] 1-2 business days to complete this process.  Drug prices often vary depending on where the prescription is filled and some pharmacies may offer cheaper prices.  The website www.goodrx.com contains coupons for medications through different pharmacies. The prices here do not account for what the cost may be with help from insurance (it may be cheaper with your insurance), but the website can give you the price if you did not use any insurance.  - You can print the associated coupon and take it with your prescription to the pharmacy.  - You may also stop by our office during regular business hours and pick up a GoodRx coupon card.  - If you need your prescription sent electronically to a different pharmacy, notify our office through [facility] or by phone at [phone]

## 2023-05-21 NOTE — Progress Notes (Signed)
   New Patient Visit   Subjective  Jane English is a 52 y.o. female who presents for the following: Hair loss and thinning  Patient states she has hair loss located at the scalp that she would like to have examined. Patient reports the areas have been there for  3  month(s). It all started with a reaction to hair dye. She reports the areas are bothersome. She states that the areas have spread. Patient reports has previously been treated for these areas. She was seen by her Patient denies Hx of bx. Patient denies family history of skin cancer(s).    The following portions of the chart were reviewed this encounter and updated as appropriate: medications, allergies, medical history  Review of Systems:  No other skin or systemic complaints except as noted in HPI or Assessment and Plan.  Objective  Well appearing patient in no apparent distress; mood and affect are within normal limits.  A focused examination was performed of the following areas: Hair loss and Thinning  Relevant exam findings are noted in the Assessment and Plan.              Assessment & Plan   Contact Dermatis secondary to Hair Dye Allergic Reaction  Exam: Involving Frontal breakage and hair thinning and erythema  Treatment Plan: - We Will prescribe Derma Smooth Oil to apply directly to scalp every 2-3 days, allow to stay in hair over night, Then Wash with your shampoo of choice - Recommended applying a deep conditioner and leave in conditioner to help reconstruct natural curl pattern - We will send in AA Gel(Clobetasol/Minoxidil) in the morning Every other morning for 1 week, if you tolerate well increase to daily use. This will be sent to [facility], Patient provided with [facility] for additional information - Recommend starting Vivascal hair vitamins 2 times daily - Recommended Collagen Supplement  - Educated on protective hair styles - We will plan to follow up in 3  months  2.  Hair Loss (secondary to inflammatory response and also clinical signs of androgenetic alopecia) - Assessment: Not specified. - Plan: Apply clobetasol and minoxidil drops from a compounding pharmacy to the scalp every other morning for the first week, then increase to every morning if there is no irritation. Avoid applying oil daily to the scalp, as it can feed dandruff. Once inflammation subsides, normal oil production should resume. - Recommend starting Vivascal hair vitamins 2 times daily - Recommended Collagen Supplement  - Educated on protective hair styles - We will plan to follow up in 3 months    Alopecia  Related Medications Fluocinolone Acetonide Body 0.01 % OIL Apply 1 Application topically every other day.   Return in about 3 months (around 08/21/2023) for Alopecia F/U.  Documentation: I have reviewed the above documentation for accuracy and completeness, and I agree with the above.  Stasia Cavalier, am acting as scribe for Langston Reusing, DO.   Langston Reusing, DO

## 2023-05-23 ENCOUNTER — Encounter: Payer: Medicare HMO | Admitting: Family Medicine

## 2023-05-26 ENCOUNTER — Telehealth: Payer: Self-pay

## 2023-05-26 NOTE — Telephone Encounter (Signed)
Pt called wanting to know if there was a moisturizer she can put on over the AA gel that she is using as she said it is drying

## 2023-05-26 NOTE — Telephone Encounter (Signed)
Pt called wanting to know if she needs to wash out the clobetasol/minoxidil compound she is using and if there is a moisturizer she can use on top of it as it is drying. Per dr. Onalee Hua told her she can use Royal Oils by head and shoulders and to use a few drops sparingly prn for dryness and to not wash compound out.

## 2023-05-26 NOTE — Telephone Encounter (Signed)
She can try Royal Oils by Head and Shoulders.  Use a few drops sparingly as needed in areas of dryness.  Thanks!

## 2023-05-28 ENCOUNTER — Ambulatory Visit: Payer: Medicare HMO

## 2023-05-28 ENCOUNTER — Encounter: Payer: Self-pay | Admitting: Family Medicine

## 2023-05-28 DIAGNOSIS — Z111 Encounter for screening for respiratory tuberculosis: Secondary | ICD-10-CM | POA: Diagnosis not present

## 2023-05-29 ENCOUNTER — Encounter: Payer: Self-pay | Admitting: Dermatology

## 2023-05-29 LAB — QUANTIFERON-TB GOLD PLUS

## 2023-05-29 NOTE — Telephone Encounter (Signed)
lmtrc

## 2023-05-30 ENCOUNTER — Other Ambulatory Visit: Payer: Self-pay | Admitting: Family Medicine

## 2023-05-30 DIAGNOSIS — M961 Postlaminectomy syndrome, not elsewhere classified: Secondary | ICD-10-CM | POA: Diagnosis not present

## 2023-06-03 NOTE — Telephone Encounter (Signed)
Hi Clydie Braun, can you please reorder the compound at [facility] for the patient.  Thanks!

## 2023-06-04 ENCOUNTER — Other Ambulatory Visit: Payer: Self-pay

## 2023-06-04 MED ORDER — SAFETY SEAL MISCELLANEOUS MISC: 1.0000 | Freq: Every morning | 3 refills | Status: DC

## 2023-06-04 NOTE — Progress Notes (Signed)
Per pt request, resent in rx for AA gel to [facility]

## 2023-06-06 ENCOUNTER — Ambulatory Visit: Payer: Medicare HMO | Admitting: Physical Medicine and Rehabilitation

## 2023-06-19 ENCOUNTER — Other Ambulatory Visit: Payer: Self-pay | Admitting: Family Medicine

## 2023-06-20 DIAGNOSIS — M961 Postlaminectomy syndrome, not elsewhere classified: Secondary | ICD-10-CM | POA: Diagnosis not present

## 2023-06-24 NOTE — Progress Notes (Signed)
B   Subjective:    Patient ID: Jane English, female    DOB: [birth year], 52 y.o.   MRN: [id number]  HPI [name] is a 52 year old woman who presents for follow-up of failed back syndrome and history of obesity, now overweight  1) Overweight, history of obesity Continues to lose weight -she cut out junk food -tries to stay away from fried foods BMI 27.70 today, weight 171lbs  2) Failed back syndrome: -needs refills of her oxycodone, she last picked up her medicine on 7/3 -has been using turmeric and collagen -needs refill of oxycodone and amitriptyline today -had stimulator removed and site has been hurting since then -stents removed as well.  -going back to see him on the 13th -no signs of infection -staples removed.  -returning to work today -needs refill of oxycodone  3) insomnia -continues to sleep poorly at night  4) depression -has been having some depression but pushes through  5) Pustules on right side of head -saw her PCP yesterday and was prescribed doxycycline -it dried her hair and it made her head itch -she was told it is caused by sweating -she asks why she got folliculiits  Pain Inventory Average Pain 9 Pain Right Now 8 My pain is intermittent, constant, sharp, burning, dull, stabbing, tingling, and aching  In the last 24 hours, has pain interfered with the following? General activity 8 Relation with others 9 Enjoyment of life 8 What TIME of day is your pain at its worst? morning , evening, and night Sleep (in general) Fair  Pain is worse with: walking, bending, sitting, inactivity, standing, and some activites Pain improves with: rest, heat/ice, and medication Relief from Meds: 10  Family History  Problem Relation Age of Onset   Physical abuse Mother    Alcohol abuse Mother    Cirrhosis Mother    Lung cancer Father    Stomach cancer Father    Esophageal cancer Father    Alcohol abuse Father    Mental illness Father    Diabetes Sister     Hypertension Sister    Bipolar disorder Sister    Schizophrenia Sister    Diabetes Sister    Drug abuse Sister    HIV Sister    Pneumonia Sister        died as a baby   Alcohol abuse Brother    Hypertension Brother    Kidney disease Brother    Diabetes Brother    Drug abuse Brother    Mental illness Brother    Alcohol abuse Brother    Alcohol abuse Brother    Hypertension Brother    Diabetes Brother    Alcohol abuse Brother    Mental illness Brother        [place]   Alcohol abuse Brother    Alcohol abuse Brother    Bipolar disorder Brother    Hypertension Brother    Bipolar disorder Brother    Drug abuse Brother    Alcohol abuse Brother    Bipolar disorder Brother    Bipolar disorder Daughter    Bipolar disorder Son    Bipolar disorder Son    ADD / ADHD Neg Hx    Anxiety disorder Neg Hx    Dementia Neg Hx    Depression Neg Hx    OCD Neg Hx    Seizures Neg Hx    Paranoid behavior Neg Hx    Colon cancer Neg Hx    Rectal cancer Neg  Hx    Social History   Socioeconomic History   Marital status: Married    Spouse name: Molly Maduro   Number of children: 4   Years of education: 12   Highest education level: GED or equivalent  Occupational History   Occupation: disabled  Tobacco Use   Smoking status: Never    Passive exposure: Never   Smokeless tobacco: Never  Vaping Use   Vaping status: Never Used  Substance and Sexual Activity   Alcohol use: No   Drug use: No   Sexual activity: Not Currently    Partners: Male    Birth control/protection: Surgical    Comment: tubal  Other Topics Concern   Not on file  Social History Narrative   Not on file   Social Determinants of Health   Financial Resource Strain: Medium Risk (03/11/2023)   Overall Financial Resource Strain (CARDIA)    Difficulty of Paying Living Expenses: Somewhat hard  Food Insecurity: No Food Insecurity (03/11/2023)   Hunger Vital Sign    Worried About Running Out of Food in the Last Year: Never  true    Ran Out of Food in the Last Year: Never true  Transportation Needs: No Transportation Needs (03/11/2023)   PRAPARE - Administrator, Civil Service (Medical): No    Lack of Transportation (Non-Medical): No  Physical Activity: Unknown (03/11/2023)   Exercise Vital Sign    Days of Exercise per Week: Patient declined    Minutes of Exercise per Session: Not on file  Stress: No Stress Concern Present (03/11/2023)   [employer] of Occupational Health - Occupational Stress Questionnaire    Feeling of Stress : Only a little  Social Connections: Socially Integrated (03/11/2023)   Social Connection and Isolation Panel [NHANES]    Frequency of Communication with Friends and Family: More than three times a week    Frequency of Social Gatherings with Friends and Family: Patient declined    Attends Religious Services: More than 4 times per year    Active Member of Clubs or Organizations: Yes    Attends Banker Meetings: 1 to 4 times per year    Marital Status: Married   Past Surgical History:  Procedure Laterality Date   ANTERIOR CERVICAL DECOMP/DISCECTOMY FUSION  07/07/2012   Procedure: ANTERIOR CERVICAL DECOMPRESSION/DISCECTOMY FUSION 2 LEVELS;  Surgeon: Karn Cassis, MD;  Location: [facility];  Service: Neurosurgery;  Laterality: N/A;  Cervical four-five, five - six  Anterior cervical decompression/diskectomy/fusion/plate   APPENDECTOMY  11/04/1984   BOWEL RESECTION N/A 07/29/2013   Procedure: serosal repair;  Surgeon: Ardeth Sportsman, MD;  Location: [facility];  Service: General;  Laterality: N/A;   CARPAL TUNNEL RELEASE Bilateral    COLON SURGERY N/A    Phreesia 07/29/2020   LAPAROSCOPY N/A 07/29/2013   Procedure: diagnostic laporoscopy;  Surgeon: Ardeth Sportsman, MD;  Location: [facility];  Service: General;  Laterality: N/A;   LAPAROSCOPY N/A 08/16/2013   Procedure: LAPAROSCOPY DIAGNOSTIC/LYSIS OF ADHESIONS;  Surgeon: Ardeth Sportsman, MD;  Location: [facility];   Service: General;  Laterality: N/A;   LAPAROTOMY N/A 08/16/2013   Procedure: EXPLORATORY LAPAROTOMY/SMALL BOWEL RESECTION (JEJUNUM);  Surgeon: Ardeth Sportsman, MD;  Location: [facility];  Service: General;  Laterality: N/A;   LUMBAR SPINE SURGERY  11/04/2008   x 3   LYSIS OF ADHESION  11/04/2001   Dr. Elpidio Anis   LYSIS OF ADHESION N/A 07/29/2013   Procedure: LYSIS OF ADHESION;  Surgeon: Viviann Spare  C. Gross, MD;  Location: [facility];  Service: General;  Laterality: N/A;   OOPHORECTOMY     PARTIAL HYSTERECTOMY  1990s?   [place], [place]   SPINAL CORD STIMULATOR IMPLANT     spine stimulator removal  12/13/2022   SPINE SURGERY N/A    Phreesia 07/29/2020   TRIGGER FINGER RELEASE  11/05/2007   right pinkie finger   TUBAL LIGATION  11/04/1992   Past Surgical History:  Procedure Laterality Date   ANTERIOR CERVICAL DECOMP/DISCECTOMY FUSION  07/07/2012   Procedure: ANTERIOR CERVICAL DECOMPRESSION/DISCECTOMY FUSION 2 LEVELS;  Surgeon: Karn Cassis, MD;  Location: [facility];  Service: Neurosurgery;  Laterality: N/A;  Cervical four-five, five - six  Anterior cervical decompression/diskectomy/fusion/plate   APPENDECTOMY  11/04/1984   BOWEL RESECTION N/A 07/29/2013   Procedure: serosal repair;  Surgeon: Ardeth Sportsman, MD;  Location: [facility];  Service: General;  Laterality: N/A;   CARPAL TUNNEL RELEASE Bilateral    COLON SURGERY N/A    Phreesia 07/29/2020   LAPAROSCOPY N/A 07/29/2013   Procedure: diagnostic laporoscopy;  Surgeon: Ardeth Sportsman, MD;  Location: [facility];  Service: General;  Laterality: N/A;   LAPAROSCOPY N/A 08/16/2013   Procedure: LAPAROSCOPY DIAGNOSTIC/LYSIS OF ADHESIONS;  Surgeon: Ardeth Sportsman, MD;  Location: [facility];  Service: General;  Laterality: N/A;   LAPAROTOMY N/A 08/16/2013   Procedure: EXPLORATORY LAPAROTOMY/SMALL BOWEL RESECTION (JEJUNUM);  Surgeon: Ardeth Sportsman, MD;  [facility]: [facility];  Service: General;  Laterality: N/A;   LUMBAR SPINE SURGERY  11/04/2008   x 3    LYSIS OF ADHESION  11/04/2001   Dr. Elpidio Anis   LYSIS OF ADHESION N/A 07/29/2013   Procedure: LYSIS OF ADHESION;  Surgeon: Ardeth Sportsman, MD;  Location: [facility];  Service: General;  Laterality: N/A;   OOPHORECTOMY     PARTIAL HYSTERECTOMY  1990s?   [place], [place]   SPINAL CORD STIMULATOR IMPLANT     spine stimulator removal  12/13/2022   SPINE SURGERY N/A    Phreesia 07/29/2020   TRIGGER FINGER RELEASE  11/05/2007   right pinkie finger   TUBAL LIGATION  11/04/1992   Past Medical History:  Diagnosis Date   Anemia    Asthma    Asthma flare 04/09/2013   Back pain    Bronchitis    Chronic abdominal pain    Chronic constipation    Constipation due to opioid therapy    Depression    Depression, major, single episode, severe (HCC) 10/03/2018   PHQ 9 score of 15   Diabetes mellitus without complication (HCC)    Diabetes mellitus, type II (HCC)    DVT (deep venous thrombosis) (HCC) 2010   GERD (gastroesophageal reflux disease)    Heart murmur    no cardiologist   Helicobacter pylori gastritis 06/11/2013   Colonoscopy Dr. Rhea Belton   Hypertension    IBS (irritable bowel syndrome)    Migraine headache    Neuropathy    Obesity    Obsessive-compulsive disorder    PSYCHOTIC D/O W/HALLUCINATIONS CONDS CLASS ELSW 03/04/2010   Qualifier: Diagnosis of  By: Lodema Hong MD, Margaret     PTSD (post-traumatic stress disorder)    SBO (small bowel obstruction) (HCC) 08/09/2013   Seasonal allergies 12/10/2012   Seizures (HCC)    Shortness of breath    There were no vitals taken for this visit.  Opioid Risk Score:   Fall Risk Score:  `1  Depression screen [facility] 2/9     05/07/2023  9:01 AM 04/22/2023    3:00 PM 04/08/2023    9:19 AM 04/07/2023    2:05 PM 03/11/2023    4:03 PM 03/07/2023    9:58 AM 01/21/2023    9:19 AM  Depression screen PHQ 2/9  Decreased Interest 1 2 0 2 3 0 2  Down, Depressed, Hopeless 1 2 0 1 3 0 2  PHQ - 2 Score 2 4 0 3 6 0 4  Altered sleeping  0  1 3  2   Tired, decreased  energy  0  2 3  2   Change in appetite  0  2 3  0  Feeling bad or failure about yourself   2  1   1   Trouble concentrating  0  0 3  0  Moving slowly or fidgety/restless  0  0 1  0  Suicidal thoughts  0  0 0  0  PHQ-9 Score  6  9 19  9   Difficult doing work/chores  Not difficult at all  Somewhat difficult   Not difficult at all     Review of Systems  Constitutional: Negative.   HENT: Negative.    Eyes: Negative.   Respiratory: Negative.    Cardiovascular: Negative.   Gastrointestinal: Negative.   Endocrine: Negative.   Genitourinary: Negative.   Musculoskeletal:  Positive for back pain.       Bilateral shoulder pain RT knee pain Bilateral feet  Skin: Negative.   Allergic/Immunologic: Negative.   Neurological: Negative.   Hematological: Negative.   Psychiatric/Behavioral: Negative.    All other systems reviewed and are negative.      Objective:   Physical Exam Gen: no distress, normal appearing, BMI 27.70, weight 171 lbs HEENT: oral mucosa pink and moist, NCAT Cardio: Reg rate Chest: normal effort, normal rate of breathing Abd: soft, non-distended Ext: no edema Psych: pleasant, normal affect Skin: intact Neuro: Alert and oriented x3, ambulating normally MSK: tight cervical myofasica    Assessment & Plan:   1) Failed back syndrome -refilled oxycodone and amitriptyline -UDS monitored -encouraged follow-up with surgeon regarding pain after spinal cord stimulator removal -Provided with a pain relief journal and discussed that it contains foods and lifestyle tips to naturally help to improve pain. Discussed that these lifestyle strategies are also very good for health unlike some medications which can have negative side effects. Discussed that the act of keeping a journal can be therapeutic and helpful to realize patterns what helps to trigger and alleviate pain.  . -Discussed following foods that may reduce pain: 1) Ginger (especially studied for arthritis)- reduce  leukotriene production to decrease inflammation 2) Blueberries- high in phytonutrients that decrease inflammation 3) Salmon- marine omega-3s reduce joint swelling and pain 4) Pumpkin seeds- reduce inflammation 5) dark chocolate- reduces inflammation 6) turmeric- reduces inflammation 7) tart cherries - reduce pain and stiffness 8) extra virgin olive oil - its compound olecanthal helps to block prostaglandins  9) chili peppers- can be eaten or applied topically via capsaicin 10) mint- helpful for headache, muscle aches, joint pain, and itching 11) garlic- reduces inflammation  Link to further information on diet for chronic pain: [email or web address].[email or web address]    2) Overweight, history of obesity -discussed slight weight increase -provided dietary education -continue Mounjaro -discussed her current diet  3) Insomnia: -continue amitriptyline -Try to go outside near sunrise -Get exercise during the day.  -Turn off all devices an hour before bedtime.  -Teas that can benefit: chamomile, valerian root, Brahmi (Bacopa) -Can consider over the  counter melatonin, magnesium, and/or L-theanine. Melatonin is an anti-oxidant with multiple health benefits. Magnesium is involved in greater than 300 enzymatic reactions in the body and most of [place] are deficient as our soil is often depleted. There are 7 different types of magnesium- Bioptemizer's is a supplement with all 7 types, and each has unique benefits. Magnesium can also help with constipation and anxiety.  -Pistachios naturally increase the production of melatonin -[employer]zy [employer] bamboo bed sheets are free from toxic chemicals.  -Tart cherry juice or a tart cherry supplement can improve sleep and soreness post-workout    4) Depression: -discussed plan to move in with her son at [facility]. -discussed her resilience -discussed that her husband wants to come back and she is ok with  this -discussed that she does not agree with leaving someone just because you have a disgreement  5)  Folliculitis: -continue doxyclycine, discussed how this can negatively affect the gut microbiome -recommended biotin -recommended rosemary -continue peppermint and tea tree oil  6) Cervical myofascial pain syndrome: -discussed trigger point injections but she defers

## 2023-06-25 ENCOUNTER — Ambulatory Visit (HOSPITAL_COMMUNITY): Payer: Medicare HMO | Admitting: Psychiatry

## 2023-06-26 ENCOUNTER — Other Ambulatory Visit: Payer: Self-pay | Admitting: Family Medicine

## 2023-06-27 ENCOUNTER — Encounter: Payer: Medicare HMO | Attending: Physical Medicine and Rehabilitation | Admitting: Physical Medicine and Rehabilitation

## 2023-06-27 VITALS — BP 115/74 | HR 85 | Ht 66.0 in | Wt 171.6 lb

## 2023-06-27 DIAGNOSIS — M7918 Myalgia, other site: Secondary | ICD-10-CM | POA: Diagnosis not present

## 2023-06-27 DIAGNOSIS — E663 Overweight: Secondary | ICD-10-CM | POA: Diagnosis not present

## 2023-06-27 DIAGNOSIS — M961 Postlaminectomy syndrome, not elsewhere classified: Secondary | ICD-10-CM | POA: Diagnosis not present

## 2023-06-27 MED ORDER — OXYCODONE HCL 5 MG PO TABS: 10.0000 mg | ORAL_TABLET | Freq: Two times a day (BID) | ORAL | 0 refills | Status: DC

## 2023-06-28 ENCOUNTER — Other Ambulatory Visit: Payer: Self-pay | Admitting: Family Medicine

## 2023-06-30 DIAGNOSIS — M961 Postlaminectomy syndrome, not elsewhere classified: Secondary | ICD-10-CM | POA: Diagnosis not present

## 2023-07-01 ENCOUNTER — Other Ambulatory Visit (HOSPITAL_COMMUNITY)
Admission: RE | Admit: 2023-07-01 | Discharge: 2023-07-01 | Disposition: A | Discharge status: Home / Self Care | Payer: Medicare HMO | Source: Ambulatory Visit | Attending: Family Medicine | Admitting: Family Medicine

## 2023-07-01 ENCOUNTER — Ambulatory Visit (INDEPENDENT_AMBULATORY_CARE_PROVIDER_SITE_OTHER): Payer: Medicare HMO | Admitting: Family Medicine

## 2023-07-01 ENCOUNTER — Encounter: Payer: Self-pay | Admitting: Family Medicine

## 2023-07-01 VITALS — BP 112/72 | HR 90 | Ht 66.0 in | Wt 172.1 lb

## 2023-07-01 DIAGNOSIS — Z01419 Encounter for gynecological examination (general) (routine) without abnormal findings: Secondary | ICD-10-CM | POA: Diagnosis not present

## 2023-07-01 DIAGNOSIS — E1169 Type 2 diabetes mellitus with other specified complication: Secondary | ICD-10-CM

## 2023-07-01 DIAGNOSIS — Z124 Encounter for screening for malignant neoplasm of cervix: Secondary | ICD-10-CM | POA: Diagnosis not present

## 2023-07-01 DIAGNOSIS — J45991 Cough variant asthma: Secondary | ICD-10-CM

## 2023-07-01 DIAGNOSIS — Z1151 Encounter for screening for human papillomavirus (HPV): Secondary | ICD-10-CM | POA: Diagnosis not present

## 2023-07-01 DIAGNOSIS — Z01411 Encounter for gynecological examination (general) (routine) with abnormal findings: Secondary | ICD-10-CM | POA: Diagnosis not present

## 2023-07-01 DIAGNOSIS — R87610 Atypical squamous cells of undetermined significance on cytologic smear of cervix (ASC-US): Secondary | ICD-10-CM | POA: Diagnosis not present

## 2023-07-01 DIAGNOSIS — Z0001 Encounter for general adult medical examination with abnormal findings: Secondary | ICD-10-CM | POA: Diagnosis not present

## 2023-07-01 DIAGNOSIS — I10 Essential (primary) hypertension: Secondary | ICD-10-CM | POA: Diagnosis not present

## 2023-07-01 DIAGNOSIS — Z13220 Encounter for screening for lipoid disorders: Secondary | ICD-10-CM

## 2023-07-01 DIAGNOSIS — Z7985 Long-term (current) use of injectable non-insulin antidiabetic drugs: Secondary | ICD-10-CM | POA: Diagnosis not present

## 2023-07-01 DIAGNOSIS — L659 Nonscarring hair loss, unspecified: Secondary | ICD-10-CM | POA: Diagnosis not present

## 2023-07-01 DIAGNOSIS — E785 Hyperlipidemia, unspecified: Secondary | ICD-10-CM | POA: Diagnosis not present

## 2023-07-01 DIAGNOSIS — M542 Cervicalgia: Secondary | ICD-10-CM

## 2023-07-01 MED ORDER — ALBUTEROL SULFATE HFA 108 (90 BASE) MCG/ACT IN AERS: INHALATION_SPRAY | RESPIRATORY_TRACT | 4 refills | Status: DC

## 2023-07-01 MED ORDER — CROMOLYN SODIUM 4 % OP SOLN: 1.0000 [drp] | Freq: Four times a day (QID) | OPHTHALMIC | 12 refills | Status: DC

## 2023-07-01 MED ORDER — CLOTRIMAZOLE-BETAMETHASONE 1-0.05 % EX CREA: TOPICAL_CREAM | CUTANEOUS | 1 refills | Status: DC

## 2023-07-01 MED ORDER — FLUOCINOLONE ACETONIDE BODY 0.01 % EX OIL: 1.0000 | TOPICAL_OIL | CUTANEOUS | 5 refills | Status: DC

## 2023-07-01 MED ORDER — PREDNISONE 5 MG PO TABS: 5.0000 mg | ORAL_TABLET | Freq: Two times a day (BID) | ORAL | 0 refills | Status: AC

## 2023-07-01 MED ORDER — ALBUTEROL SULFATE (2.5 MG/3ML) 0.083% IN NEBU: 2.5000 mg | INHALATION_SOLUTION | Freq: Four times a day (QID) | RESPIRATORY_TRACT | 1 refills | Status: AC | PRN

## 2023-07-01 NOTE — Assessment & Plan Note (Signed)
Short course of prednisone, and X ray C spine

## 2023-07-01 NOTE — Progress Notes (Signed)
Jane English     MRN: [id number]      DOB: 1970/12/29  Chief Complaint  Patient presents with   Annual Exam    Cpe discuss weight medication (wegovy)    HPI: Patient is in for annual physical exam. 1 month h/o posterior neck and shoulder pain.  Immunization is reviewed , and needs to be   updated   PE: BP 112/72 (BP Location: Right Arm, Patient Position: Sitting, Cuff Size: Large)   Pulse 90   Ht 5\' 6"  (1.676 m)   Wt 172 lb 1.9 oz (78.1 kg)   SpO2 97%   BMI 27.78 kg/m   Pleasant  female, alert and oriented x 3, in no cardio-pulmonary distress. Afebrile. HEENT No facial trauma or asymetry. Sinuses non tender.  Extra occullar muscles intact.. External ears normal, . Neck: decreased ROM, no adenopathy,JVD or thyromegaly.No bruits.  Chest: Clear to ascultation bilaterally.No crackles or wheezes. Non tender to palpation  Breast: No t examined, asymptomatic , normal mammogram 04/08/2023  Cardiovascular system; Heart sounds normal,  S1 and  S2 ,no S3.  No murmur, or thrill. Apical beat not displaced Peripheral pulses normal.  Abdomen: Soft, non tender, no organomegaly or masses. No bruits. Bowel sounds normal. No guarding, tenderness or rebound.   GU: External genitalia normal female genitalia , normal female distribution of hair. No lesions. Urethral meatus normal in size, no  Prolapse, no lesions visibly  Present. Bladder non tender. Vagina pink and moist , with no visible lesions , discharge present . Adequate pelvic support no  cystocele or rectocele noted Cervix pink and appears healthy, no lesions or ulcerations noted, no discharge noted from os Uterus normal size, no adnexal masses, no cervical motion or adnexal tenderness.   Musculoskeletal exam: Full ROM of spine, hips , shoulders and knees. No deformity ,swelling or crepitus noted. No muscle wasting or atrophy.   Neurologic: Cranial nerves 2 to 12 intact. Power, tone ,sensation and reflexes  normal throughout. No disturbance in gait. No tremor.  Skin: Intact, no ulceration, erythema , scaling or rash noted. Pigmentation normal throughout  Psych; Normal mood and affect. Judgement and concentration normal   Assessment & Plan:  Encounter for annual general medical examination with abnormal findings in adult Annual exam as documented. Counseling done  re healthy lifestyle involving commitment to 150 minutes exercise per week, heart healthy diet, and attaining healthy weight.The importance of adequate sleep also discussed. Regular seat belt use and home safety, is also discussed. Changes in health habits are decided on by the patient with goals and time frames  set for achieving them. Immunization and cancer screening needs are specifically addressed at this visit.   Pap smear for cervical cancer screening Specimen sent  Neck pain, bilateral Short course of prednisone, and X ray C spine  Type 2 diabetes mellitus with other specified complication (HCC) Jane English is reminded of the importance of commitment to daily physical activity for 30 minutes or more, as able and the need to limit carbohydrate intake to 30 to 60 grams per meal to help with blood sugar control.   The need to take medication as prescribed, test blood sugar as directed, and to call between visits if there is a concern that blood sugar is uncontrolled is also discussed.   Jane English is reminded of the importance of daily foot exam, annual eye examination, and good blood sugar, blood pressure and cholesterol control.     Latest Ref Rng & Units  03/12/2023   10:57 AM 09/23/2022    8:36 AM 09/20/2022    9:53 AM 06/05/2022    8:45 PM 03/21/2022   12:13 PM  Diabetic Labs  HbA1c 4.8 - 5.6 % 5.9  5.9    6.7   Micro/Creat Ratio 0 - 29 mg/g creat   3     Chol 100 - 199 mg/dL 332  951    884   HDL >16 mg/dL 62  58    50   Calc LDL 0 - 99 mg/dL 606  88    94   Triglycerides 0 - 149 mg/dL 71  301    601    Creatinine 0.57 - 1.00 mg/dL 0.93  2.35   5.73  2.20       07/01/2023    8:35 AM 06/27/2023   10:10 AM 05/07/2023    8:59 AM 04/22/2023    3:29 PM 04/22/2023    2:58 PM 04/08/2023    9:11 AM 04/07/2023    2:04 PM  BP/Weight  Systolic BP 112 115 111 120 127 96 95  Diastolic BP 72 74 74 82 85 66 61  Wt. (Lbs) 172.12 171.6 153.4  147.04 148 147  BMI 27.78 kg/m2 27.7 kg/m2 24.76 kg/m2  23.73 kg/m2 23.89 kg/m2 23.73 kg/m2      Latest Ref Rng & Units 08/15/2022   12:00 AM 03/21/2022   10:40 AM  Foot/eye exam completion dates  Eye Exam No Retinopathy No Retinopathy       Foot Form Completion   Done     This result is from an external source.      Updated lab today , plan to resume moinjaro  Hyperlipidemia LDL goal <100 Hyperlipidemia:Low fat diet discussed and encouraged.   Lipid Panel  Lab Results  Component Value Date   CHOL 193 03/12/2023   HDL 62 03/12/2023   LDLCALC 118 (H) 03/12/2023   TRIG 71 03/12/2023   CHOLHDL 3.1 03/12/2023     Updated lab needed

## 2023-07-01 NOTE — Assessment & Plan Note (Signed)
Hyperlipidemia:Low fat diet discussed and encouraged.   Lipid Panel  Lab Results  Component Value Date   CHOL 193 03/12/2023   HDL 62 03/12/2023   LDLCALC 118 (H) 03/12/2023   TRIG 71 03/12/2023   CHOLHDL 3.1 03/12/2023     Updated lab needed

## 2023-07-01 NOTE — Assessment & Plan Note (Signed)
Jane English is reminded of the importance of commitment to daily physical activity for 30 minutes or more, as able and the need to limit carbohydrate intake to 30 to 60 grams per meal to help with blood sugar control.   The need to take medication as prescribed, test blood sugar as directed, and to call between visits if there is a concern that blood sugar is uncontrolled is also discussed.   Jane English is reminded of the importance of daily foot exam, annual eye examination, and good blood sugar, blood pressure and cholesterol control.     Latest Ref Rng & Units 03/12/2023   10:57 AM 09/23/2022    8:36 AM 09/20/2022    9:53 AM 06/05/2022    8:45 PM 03/21/2022   12:13 PM  Diabetic Labs  HbA1c 4.8 - 5.6 % 5.9  5.9    6.7   Micro/Creat Ratio 0 - 29 mg/g creat   3     Chol 100 - 199 mg/dL 010  272    536   HDL >64 mg/dL 62  58    50   Calc LDL 0 - 99 mg/dL 403  88    94   Triglycerides 0 - 149 mg/dL 71  474    259   Creatinine 0.57 - 1.00 mg/dL 5.63  8.75   6.43  3.29       07/01/2023    8:35 AM 06/27/2023   10:10 AM 05/07/2023    8:59 AM 04/22/2023    3:29 PM 04/22/2023    2:58 PM 04/08/2023    9:11 AM 04/07/2023    2:04 PM  BP/Weight  Systolic BP 112 115 111 120 127 96 95  Diastolic BP 72 74 74 82 85 66 61  Wt. (Lbs) 172.12 171.6 153.4  147.04 148 147  BMI 27.78 kg/m2 27.7 kg/m2 24.76 kg/m2  23.73 kg/m2 23.89 kg/m2 23.73 kg/m2      Latest Ref Rng & Units 08/15/2022   12:00 AM 03/21/2022   10:40 AM  Foot/eye exam completion dates  Eye Exam No Retinopathy No Retinopathy       Foot Form Completion   Done     This result is from an external source.      Updated lab today , plan to resume moinjaro

## 2023-07-01 NOTE — Assessment & Plan Note (Signed)
Annual exam as documented. Counseling done  re healthy lifestyle involving commitment to 150 minutes exercise per week, heart healthy diet, and attaining healthy weight.The importance of adequate sleep also discussed. Regular seat belt use and home safety, is also discussed. Changes in health habits are decided on by the patient with goals and time frames  set for achieving them. Immunization and cancer screening needs are specifically addressed at this visit.  

## 2023-07-01 NOTE — Assessment & Plan Note (Signed)
Specimen sent.

## 2023-07-01 NOTE — Patient Instructions (Addendum)
F/U in 4 months, call if you need me sooner  Labs today lipids CMP and EGFR and HbA1c.  Pap sent today.  I will resume Mounjaro as well as start you on a statin once we get the lab results.  A message will be sent to you.  5-day course of prednisone prescribed for bilateral shoulder pain.  X-ray of your neck today also.  It is important that you exercise regularly at least 30 minutes 5 times a week. If you develop chest pain, have severe difficulty breathing, or feel very tired, stop exercising immediately and seek medical attention  Thanks for choosing [facility], we consider it a privelige to serve you.

## 2023-07-02 ENCOUNTER — Other Ambulatory Visit: Payer: Self-pay | Admitting: Family Medicine

## 2023-07-02 LAB — LIPID PANEL
Chol/HDL Ratio: 3.1 ratio (ref 0.0–4.4)
Cholesterol, Total: 204 mg/dL — ABNORMAL HIGH (ref 100–199)
HDL: 66 mg/dL (ref >39)
LDL Chol Calc (NIH): 94 mg/dL (ref 0–99)
Triglycerides: 268 mg/dL — ABNORMAL HIGH (ref 0–149)
VLDL Cholesterol Cal: 44 mg/dL — ABNORMAL HIGH (ref 5–40)

## 2023-07-02 LAB — CMP14+EGFR
ALT: 28 IU/L (ref 0–32)
AST: 21 IU/L (ref 0–40)
Albumin: 4.2 g/dL (ref 3.8–4.9)
Alkaline Phosphatase: 107 IU/L (ref 44–121)
BUN/Creatinine Ratio: 20 (ref 9–23)
BUN: 14 mg/dL (ref 6–24)
Bilirubin Total: <0.2 mg/dL (ref 0.0–1.2)
CO2: 21 mmol/L (ref 20–29)
Calcium: 9.5 mg/dL (ref 8.7–10.2)
Chloride: 103 mmol/L (ref 96–106)
Creatinine, Ser: 0.69 mg/dL (ref 0.57–1.00)
Globulin, Total: 3 g/dL (ref 1.5–4.5)
Glucose: 91 mg/dL (ref 70–99)
Potassium: 4.2 mmol/L (ref 3.5–5.2)
Sodium: 142 mmol/L (ref 134–144)
Total Protein: 7.2 g/dL (ref 6.0–8.5)
eGFR: 103 mL/min/{1.73_m2} (ref >59)

## 2023-07-02 LAB — HEMOGLOBIN A1C
Est. average glucose Bld gHb Est-mCnc: 126 mg/dL
Hgb A1c MFr Bld: 6 % — ABNORMAL HIGH (ref 4.8–5.6)

## 2023-07-02 MED ORDER — TIRZEPATIDE 2.5 MG/0.5ML ~~LOC~~ SOAJ: 2.5000 mg | SUBCUTANEOUS | 0 refills | Status: DC

## 2023-07-02 MED ORDER — TIRZEPATIDE 5 MG/0.5ML ~~LOC~~ SOAJ: 5.0000 mg | SUBCUTANEOUS | 1 refills | Status: DC

## 2023-07-02 MED ORDER — ROSUVASTATIN CALCIUM 5 MG PO TABS: 5.0000 mg | ORAL_TABLET | Freq: Every day | ORAL | 5 refills | Status: DC

## 2023-07-02 MED ORDER — MONTELUKAST SODIUM 10 MG PO TABS: ORAL_TABLET | ORAL | 0 refills | Status: DC

## 2023-07-02 NOTE — Addendum Note (Signed)
Addended by: Kerri Perches on: 07/02/2023 06:32 AM   Modules accepted: Orders

## 2023-07-03 ENCOUNTER — Other Ambulatory Visit: Payer: Self-pay | Admitting: Physical Medicine and Rehabilitation

## 2023-07-03 MED ORDER — LIDOCAINE 5 % EX PTCH: 1.0000 | MEDICATED_PATCH | CUTANEOUS | 0 refills | Status: DC

## 2023-07-04 ENCOUNTER — Telehealth: Payer: Self-pay | Admitting: Family Medicine

## 2023-07-04 ENCOUNTER — Other Ambulatory Visit: Payer: Self-pay

## 2023-07-04 MED ORDER — TIRZEPATIDE 2.5 MG/0.5ML ~~LOC~~ SOAJ: 2.5000 mg | SUBCUTANEOUS | 0 refills | Status: DC

## 2023-07-04 NOTE — Telephone Encounter (Signed)
I am confused on which dose she is supposed to be on. The 2.5 was sent 8/30 but she has a 5mg  on file to start 07/30/23. Pt has already took the 5mg  dose for this week because it was all she had. Please advise Monday on your return

## 2023-07-04 NOTE — Telephone Encounter (Signed)
Patient called in needing provider to override prescription for tirzepatide [facility]) 2.5 MG/0.5ML Pen [[id number]]    Pharm will not fill due to last script being for the 5mg .   Patient wants a call back  Pt did go ahead and use the 5mg  today shot was due and that all she had to take.

## 2023-07-10 DIAGNOSIS — E119 Type 2 diabetes mellitus without complications: Secondary | ICD-10-CM | POA: Diagnosis not present

## 2023-07-10 DIAGNOSIS — H524 Presbyopia: Secondary | ICD-10-CM | POA: Diagnosis not present

## 2023-07-10 DIAGNOSIS — Z0100 Encounter for examination of eyes and vision without abnormal findings: Secondary | ICD-10-CM | POA: Diagnosis not present

## 2023-07-10 LAB — CYTOLOGY - PAP
Comment: NEGATIVE
Diagnosis: UNDETERMINED — AB
High risk HPV: NEGATIVE

## 2023-07-10 LAB — HM DIABETES EYE EXAM

## 2023-07-14 ENCOUNTER — Encounter: Payer: Self-pay | Admitting: Family Medicine

## 2023-07-21 DIAGNOSIS — M961 Postlaminectomy syndrome, not elsewhere classified: Secondary | ICD-10-CM | POA: Diagnosis not present

## 2023-07-22 ENCOUNTER — Other Ambulatory Visit: Payer: Self-pay | Admitting: Family Medicine

## 2023-07-22 ENCOUNTER — Other Ambulatory Visit: Payer: Self-pay | Admitting: Internal Medicine

## 2023-07-25 ENCOUNTER — Ambulatory Visit (INDEPENDENT_AMBULATORY_CARE_PROVIDER_SITE_OTHER): Payer: Medicare HMO

## 2023-07-25 DIAGNOSIS — Z23 Encounter for immunization: Secondary | ICD-10-CM | POA: Diagnosis not present

## 2023-07-28 ENCOUNTER — Encounter: Payer: Medicare HMO | Attending: Physical Medicine and Rehabilitation | Admitting: Physical Medicine and Rehabilitation

## 2023-07-28 DIAGNOSIS — M961 Postlaminectomy syndrome, not elsewhere classified: Secondary | ICD-10-CM | POA: Insufficient documentation

## 2023-07-28 DIAGNOSIS — M7918 Myalgia, other site: Secondary | ICD-10-CM | POA: Insufficient documentation

## 2023-07-28 DIAGNOSIS — E663 Overweight: Secondary | ICD-10-CM | POA: Insufficient documentation

## 2023-07-31 DIAGNOSIS — M961 Postlaminectomy syndrome, not elsewhere classified: Secondary | ICD-10-CM | POA: Diagnosis not present

## 2023-08-02 ENCOUNTER — Other Ambulatory Visit: Payer: Self-pay | Admitting: Internal Medicine

## 2023-08-14 ENCOUNTER — Ambulatory Visit (HOSPITAL_COMMUNITY): Payer: Medicare HMO | Admitting: Psychiatry

## 2023-08-20 DIAGNOSIS — M961 Postlaminectomy syndrome, not elsewhere classified: Secondary | ICD-10-CM | POA: Diagnosis not present

## 2023-08-25 ENCOUNTER — Encounter: Payer: Self-pay | Admitting: Physical Medicine and Rehabilitation

## 2023-08-25 ENCOUNTER — Encounter: Payer: Medicare HMO | Attending: Physical Medicine and Rehabilitation | Admitting: Physical Medicine and Rehabilitation

## 2023-08-25 VITALS — Ht 66.0 in

## 2023-08-25 DIAGNOSIS — G4701 Insomnia due to medical condition: Secondary | ICD-10-CM

## 2023-08-25 DIAGNOSIS — Z566 Other physical and mental strain related to work: Secondary | ICD-10-CM | POA: Diagnosis not present

## 2023-08-25 DIAGNOSIS — Z5181 Encounter for therapeutic drug level monitoring: Secondary | ICD-10-CM | POA: Diagnosis not present

## 2023-08-25 DIAGNOSIS — Z638 Other specified problems related to primary support group: Secondary | ICD-10-CM | POA: Diagnosis not present

## 2023-08-25 DIAGNOSIS — E663 Overweight: Secondary | ICD-10-CM | POA: Diagnosis not present

## 2023-08-25 DIAGNOSIS — G894 Chronic pain syndrome: Secondary | ICD-10-CM | POA: Diagnosis not present

## 2023-08-25 DIAGNOSIS — Z79899 Other long term (current) drug therapy: Secondary | ICD-10-CM | POA: Diagnosis not present

## 2023-08-25 MED ORDER — OXYCODONE HCL 5 MG PO TABS: 10.0000 mg | ORAL_TABLET | Freq: Two times a day (BID) | ORAL | 0 refills | Status: DC

## 2023-08-25 NOTE — Progress Notes (Addendum)
B   Subjective:    Patient ID: Jane English, female    DOB: 05-30-1971, 52 y.o.   MRN: [id number]  HPI Jane English is a 52 year old woman who presents for f/u of failed back syndrome and history of obesity, now overweight.  1) Overweight, history of obesity Continues to lose weight -she cut out junk food -tries to stay away from fried foods BMI 27.70 today, weight 171lbs  2) Failed back syndrome: -needs refill of her oxycodone -has been using turmeric and collagen -needs refill of oxycodone and amitriptyline today -had stimulator removed and site has been hurting since then -stents removed as well.  -going back to see him on the 13th -no signs of infection -staples removed.  -returning to work today -needs refill of oxycodone  3) insomnia -continues to sleep poorly at night  4) depression -has been having some depression but pushes through  5) Pustules on right side of head -saw her PCP yesterday and was prescribed doxycycline -it dried her hair and it made her head itch -she was told it is caused by sweating -she asks why she got folliculiits  6) Work stress: -discussed that she has a very difficult charge who has been hurting herself  Pain Inventory Average Pain 10 Pain Right Now 8 My pain is intermittent, constant, sharp, burning, dull, stabbing, tingling, and aching  In the last 24 hours, has pain interfered with the following? General activity 8 Relation with others 8 Enjoyment of life 8 What TIME of day is your pain at its worst? morning , evening, and night Sleep (in general) Poor  Pain is worse with: walking, bending, sitting, inactivity, standing, and some activites Pain improves with: rest and medication Relief from Meds: good  Family History  Problem Relation Age of Onset   Physical abuse Mother    Alcohol abuse Mother    Cirrhosis Mother    Lung cancer Father    Stomach cancer Father    Esophageal cancer Father    Alcohol abuse Father     Mental illness Father    Diabetes Sister    Hypertension Sister    Bipolar disorder Sister    Schizophrenia Sister    Diabetes Sister    Drug abuse Sister    HIV Sister    Pneumonia Sister        died as a baby   Alcohol abuse Brother    Hypertension Brother    Kidney disease Brother    Diabetes Brother    Drug abuse Brother    Mental illness Brother    Alcohol abuse Brother    Alcohol abuse Brother    Hypertension Brother    Diabetes Brother    Alcohol abuse Brother    Mental illness Brother        in [place]   Alcohol abuse Brother    Alcohol abuse Brother    Bipolar disorder Brother    Hypertension Brother    Bipolar disorder Brother    Drug abuse Brother    Alcohol abuse Brother    Bipolar disorder Brother    Bipolar disorder Daughter    Bipolar disorder Son    Bipolar disorder Son    ADD / ADHD Neg Hx    Anxiety disorder Neg Hx    Dementia Neg Hx    Depression Neg Hx    OCD Neg Hx    Seizures Neg Hx    Paranoid behavior Neg Hx    Colon cancer  Neg Hx    Rectal cancer Neg Hx    Social History   Socioeconomic History   Marital status: Married    Spouse name: Molly Maduro   Number of children: 4   Years of education: 12   Highest education level: GED or equivalent  Occupational History   Occupation: disabled  Tobacco Use   Smoking status: Never    Passive exposure: Never   Smokeless tobacco: Never  Vaping Use   Vaping status: Never Used  Substance and Sexual Activity   Alcohol use: No   Drug use: No   Sexual activity: Not Currently    Partners: Male    Birth control/protection: Surgical    Comment: tubal  Other Topics Concern   Not on file  Social History Narrative   Not on file   Social Determinants of Health   Financial Resource Strain: Medium Risk (03/11/2023)   Overall Financial Resource Strain (CARDIA)    Difficulty of Paying Living Expenses: Somewhat hard  Food Insecurity: No Food Insecurity (03/11/2023)   Hunger Vital Sign    Worried About  Running Out of Food in the Last Year: Never true    Ran Out of Food in the Last Year: Never true  Transportation Needs: No Transportation Needs (03/11/2023)   PRAPARE - Administrator, Civil Service (Medical): No    Lack of Transportation (Non-Medical): No  Physical Activity: Unknown (03/11/2023)   Exercise Vital Sign    Days of Exercise per Week: Patient declined    Minutes of Exercise per Session: Not on file  Stress: No Stress Concern Present (03/11/2023)   [employer] of Occupational Health - Occupational Stress Questionnaire    Feeling of Stress : Only a little  Social Connections: Socially Integrated (03/11/2023)   Social Connection and Isolation Panel [NHANES]    Frequency of Communication with Friends and Family: More than three times a week    Frequency of Social Gatherings with Friends and Family: Patient declined    Attends Religious Services: More than 4 times per year    Active Member of Clubs or Organizations: Yes    Attends Banker Meetings: 1 to 4 times per year    Marital Status: Married   Past Surgical History:  Procedure Laterality Date   ANTERIOR CERVICAL DECOMP/DISCECTOMY FUSION  07/07/2012   Procedure: ANTERIOR CERVICAL DECOMPRESSION/DISCECTOMY FUSION 2 LEVELS;  Surgeon: Karn Cassis, MD;  Location: [facility];  Service: Neurosurgery;  Laterality: N/A;  Cervical four-five, five - six  Anterior cervical decompression/diskectomy/fusion/plate   APPENDECTOMY  11/04/1984   BOWEL RESECTION N/A 07/29/2013   Procedure: serosal repair;  Surgeon: Ardeth Sportsman, MD;  Location: [facility];  Service: General;  Laterality: N/A;   CARPAL TUNNEL RELEASE Bilateral    COLON SURGERY N/A    Phreesia 07/29/2020   LAPAROSCOPY N/A 07/29/2013   Procedure: diagnostic laporoscopy;  Surgeon: Ardeth Sportsman, MD;  Location: [facility];  Service: General;  Laterality: N/A;   LAPAROSCOPY N/A 08/16/2013   Procedure: LAPAROSCOPY DIAGNOSTIC/LYSIS OF ADHESIONS;  Surgeon:  Ardeth Sportsman, MD;  Location: [facility];  Service: General;  Laterality: N/A;   LAPAROTOMY N/A 08/16/2013   Procedure: EXPLORATORY LAPAROTOMY/SMALL BOWEL RESECTION (JEJUNUM);  Surgeon: Ardeth Sportsman, MD;  Location: [facility];  Service: General;  Laterality: N/A;   LUMBAR SPINE SURGERY  11/04/2008   x 3   LYSIS OF ADHESION  11/04/2001   Dr. Elpidio Anis   LYSIS OF ADHESION N/A 07/29/2013  Procedure: LYSIS OF ADHESION;  Surgeon: Ardeth Sportsman, MD;  Location: [facility];  Service: General;  Laterality: N/A;   OOPHORECTOMY     PARTIAL HYSTERECTOMY  1990s?   [place], [place]   SPINAL CORD STIMULATOR IMPLANT     spine stimulator removal  12/13/2022   SPINE SURGERY N/A    Phreesia 07/29/2020   TRIGGER FINGER RELEASE  11/05/2007   right pinkie finger   TUBAL LIGATION  11/04/1992   Past Surgical History:  Procedure Laterality Date   ANTERIOR CERVICAL DECOMP/DISCECTOMY FUSION  07/07/2012   Procedure: ANTERIOR CERVICAL DECOMPRESSION/DISCECTOMY FUSION 2 LEVELS;  Surgeon: Karn Cassis, MD;  Location: [facility];  Service: Neurosurgery;  Laterality: N/A;  Cervical four-five, five - six  Anterior cervical decompression/diskectomy/fusion/plate   APPENDECTOMY  11/04/1984   BOWEL RESECTION N/A 07/29/2013   Procedure: serosal repair;  Surgeon: Ardeth Sportsman, MD;  Location: [facility];  Service: General;  Laterality: N/A;   CARPAL TUNNEL RELEASE Bilateral    COLON SURGERY N/A    Phreesia 07/29/2020   LAPAROSCOPY N/A 07/29/2013   Procedure: diagnostic laporoscopy;  Surgeon: Ardeth Sportsman, MD;  Location: [facility];  Service: General;  Laterality: N/A;   LAPAROSCOPY N/A 08/16/2013   Procedure: LAPAROSCOPY DIAGNOSTIC/LYSIS OF ADHESIONS;  Surgeon: Ardeth Sportsman, MD;  Location: [facility];  Service: General;  Laterality: N/A;   LAPAROTOMY N/A 08/16/2013   Procedure: EXPLORATORY LAPAROTOMY/SMALL BOWEL RESECTION (JEJUNUM);  Surgeon: Ardeth Sportsman, MD;  Location: [facility];  Service: General;  Laterality: N/A;    LUMBAR SPINE SURGERY  11/04/2008   x 3   LYSIS OF ADHESION  11/04/2001   Dr. Elpidio Anis   LYSIS OF ADHESION N/A 07/29/2013   Procedure: LYSIS OF ADHESION;  Surgeon: Ardeth Sportsman, MD;  Location: [facility];  Service: General;  Laterality: N/A;   OOPHORECTOMY     PARTIAL HYSTERECTOMY  1990s?   [place], [place]   SPINAL CORD STIMULATOR IMPLANT     spine stimulator removal  12/13/2022   SPINE SURGERY N/A    Phreesia 07/29/2020   TRIGGER FINGER RELEASE  11/05/2007   right pinkie finger   TUBAL LIGATION  11/04/1992   Past Medical History:  Diagnosis Date   Anemia    Asthma    Asthma flare 04/09/2013   Back pain    Bronchitis    Chronic abdominal pain    Chronic constipation    Constipation due to opioid therapy    Depression    Depression, major, single episode, severe (HCC) 10/03/2018   PHQ 9 score of 15   Diabetes mellitus without complication (HCC)    Diabetes mellitus, type II (HCC)    DVT (deep venous thrombosis) (HCC) 2010   GERD (gastroesophageal reflux disease)    Heart murmur    no cardiologist   Helicobacter pylori gastritis 06/11/2013   Colonoscopy Dr. Rhea Belton   Hypertension    IBS (irritable bowel syndrome)    Migraine headache    Neuropathy    Obesity    Obsessive-compulsive disorder    PSYCHOTIC D/O W/HALLUCINATIONS CONDS CLASS ELSW 03/04/2010   Qualifier: Diagnosis of  By: Lodema Hong MD, Margaret     PTSD (post-traumatic stress disorder)    SBO (small bowel obstruction) (HCC) 08/09/2013   Seasonal allergies 12/10/2012   Seizures (HCC)    Shortness of breath    There were no vitals taken for this visit.  Opioid Risk Score:   Fall Risk Score:  `1  Depression screen PHQ  2/9     07/01/2023    8:36 AM 06/27/2023   10:00 AM 05/07/2023    9:01 AM 04/22/2023    3:00 PM 04/08/2023    9:19 AM 04/07/2023    2:05 PM 03/11/2023    4:03 PM  Depression screen PHQ 2/9  Decreased Interest 0 0 1 2 0 2 3  Down, Depressed, Hopeless 0 0 1 2 0 1 3  PHQ - 2 Score 0 0 2 4 0 3 6   Altered sleeping    0  1 3  Tired, decreased energy    0  2 3  Change in appetite    0  2 3  Feeling bad or failure about yourself     2  1   Trouble concentrating    0  0 3  Moving slowly or fidgety/restless    0  0 1  Suicidal thoughts    0  0 0  PHQ-9 Score    6  9 19   Difficult doing work/chores    Not difficult at all  Somewhat difficult      Review of Systems  Constitutional: Negative.   HENT: Negative.    Eyes: Negative.   Respiratory: Negative.    Cardiovascular: Negative.   Gastrointestinal: Negative.   Endocrine: Negative.   Genitourinary: Negative.   Musculoskeletal:  Positive for back pain.       Bilateral shoulder pain Right knee pain Bilateral feet  Skin: Negative.   Allergic/Immunologic: Negative.   Neurological: Negative.   Hematological: Negative.   Psychiatric/Behavioral: Negative.    All other systems reviewed and are negative.      Objective:   Physical Exam Gen: no distress, normal appearing, BMI 27.70, weight 172 lbs HEENT: oral mucosa pink and moist, NCAT Cardio: Reg rate Chest: normal effort, normal rate of breathing Abd: soft, non-distended Ext: no edema Psych: pleasant, normal affect Skin: intact Neuro: Alert and oriented x3, ambulating normally MSK: tight cervical myofasica, TTP lower back bilaterally    Assessment & Plan:   1) Failed back syndrome -refilled oxycodone and amitriptyline -UDS ordered -encouraged follow-up with surgeon regarding pain after spinal cord stimulator removal -Provided with a pain relief journal and discussed that it contains foods and lifestyle tips to naturally help to improve pain. Discussed that these lifestyle strategies are also very good for health unlike some medications which can have negative side effects. Discussed that the act of keeping a journal can be therapeutic and helpful to realize patterns what helps to trigger and alleviate pain.  . -Discussed current symptoms of pain and history of pain.   -Discussed benefits of exercise in reducing pain. -Discussed following foods that may reduce pain: 1) Ginger (especially studied for arthritis)- reduce leukotriene production to decrease inflammation 2) Blueberries- high in phytonutrients that decrease inflammation 3) Salmon- marine omega-3s reduce joint swelling and pain 4) Pumpkin seeds- reduce inflammation 5) dark chocolate- reduces inflammation 6) turmeric- reduces inflammation 7) tart cherries - reduce pain and stiffness 8) extra virgin olive oil - its compound olecanthal helps to block prostaglandins  9) chili peppers- can be eaten or applied topically via capsaicin 10) mint- helpful for headache, muscle aches, joint pain, and itching 11) garlic- reduces inflammation  Link to further information on diet for chronic pain: http://www.bray.com/   Turmeric to reduce inflammation--can be used in cooking or taken as a supplement.  Benefits of turmeric:  -Highly anti-inflammatory  -Increases antioxidants  -Improves memory, attention, brain disease  -Lowers risk  of heart disease  -May help prevent cancer  -Decreases pain  -Alleviates depression  -Delays aging and decreases risk of chronic disease  -Consume with black pepper to increase absorption   2) Overweight, history of obesity -discussed slight weight increase -provided dietary education -continue Mounjaro -discussed her current diet  3) Insomnia: -continue amitriptyline -Try to go outside near sunrise -Get exercise during the day.  -Turn off all devices an hour before bedtime.  -Teas that can benefit: chamomile, valerian root, Brahmi (Bacopa) -Can consider over the counter melatonin, magnesium, and/or L-theanine. Melatonin is an anti-oxidant with multiple health benefits. Magnesium is involved in greater than 300 enzymatic reactions in the body and most of [place] are deficient as our soil is  often depleted. There are 7 different types of magnesium- Bioptemizer's is a supplement with all 7 types, and each has unique benefits. Magnesium can also help with constipation and anxiety.  -Pistachios naturally increase the production of melatonin -Cozy Earth bamboo bed sheets are free from toxic chemicals.  -Tart cherry juice or a tart cherry supplement can improve sleep and soreness post-workout    4) Depression: -discussed plan to move in with her son at [facility]. -discussed her resilience -discussed that her husband wants to come back and she is ok with this -discussed that she does not agree with leaving someone just because you have a disgreement  5)  Folliculitis: -previously took doxyclycine, discussed how this can negatively affect the gut microbiome -recommended biotin -recommended rosemary -continue peppermint and tea tree oil  6) Cervical myofascial pain syndrome: -discussed trigger point injections but she defers  7) Family stress: -discussed that she is continuing with plan to divorce her husband

## 2023-08-25 NOTE — Patient Instructions (Addendum)
Insomnia: -Try to go outside near sunrise -Get exercise during the day.  -Turn off all devices an hour before bedtime.  -Teas that can benefit: chamomile, valerian root, Brahmi (Bacopa) -Can consider over the counter melatonin, magnesium, and/or L-theanine. Melatonin is an anti-oxidant with multiple health benefits. Magnesium is involved in greater than 300 enzymatic reactions in the body and most of [place] are deficient as our soil is often depleted. There are 7 different types of magnesium- Bioptemizer's is a supplement with all 7 types, and each has unique benefits. Magnesium can also help with constipation and anxiety.  -Pistachios naturally increase the production of melatonin -Cozy Earth bamboo bed sheets are free from toxic chemicals.  -Tart cherry juice or a tart cherry supplement can improve sleep and soreness post-workout   -Discussed current symptoms of pain and history of pain.  -Discussed benefits of exercise in reducing pain. -Discussed following foods that may reduce pain: 1) Ginger (especially studied for arthritis)- reduce leukotriene production to decrease inflammation 2) Blueberries- high in phytonutrients that decrease inflammation 3) Salmon- marine omega-3s reduce joint swelling and pain 4) Pumpkin seeds- reduce inflammation 5) dark chocolate- reduces inflammation 6) turmeric- reduces inflammation 7) tart cherries - reduce pain and stiffness 8) extra virgin olive oil - its compound olecanthal helps to block prostaglandins  9) chili peppers- can be eaten or applied topically via capsaicin 10) mint- helpful for headache, muscle aches, joint pain, and itching 11) garlic- reduces inflammation  Link to further information on diet for chronic pain: http://www.bray.com/   Turmeric to reduce inflammation--can be used in cooking or taken as a supplement.  Benefits of turmeric:  -Highly  anti-inflammatory  -Increases antioxidants  -Improves memory, attention, brain disease  -Lowers risk of heart disease  -May help prevent cancer  -Decreases pain  -Alleviates depression  -Delays aging and decreases risk of chronic disease  -Consume with black pepper to increase absorption

## 2023-08-26 ENCOUNTER — Ambulatory Visit: Payer: Medicare HMO | Admitting: Dermatology

## 2023-08-28 LAB — TOXASSURE SELECT,+ANTIDEPR,UR

## 2023-08-30 DIAGNOSIS — M961 Postlaminectomy syndrome, not elsewhere classified: Secondary | ICD-10-CM | POA: Diagnosis not present

## 2023-09-03 ENCOUNTER — Ambulatory Visit: Payer: Medicare HMO | Admitting: Dermatology

## 2023-09-04 ENCOUNTER — Other Ambulatory Visit: Payer: Self-pay | Admitting: Family Medicine

## 2023-09-18 ENCOUNTER — Ambulatory Visit (INDEPENDENT_AMBULATORY_CARE_PROVIDER_SITE_OTHER): Payer: Medicare HMO | Admitting: Dermatology

## 2023-09-18 DIAGNOSIS — L649 Androgenic alopecia, unspecified: Secondary | ICD-10-CM | POA: Diagnosis not present

## 2023-09-18 DIAGNOSIS — L659 Nonscarring hair loss, unspecified: Secondary | ICD-10-CM

## 2023-09-18 MED ORDER — SAFETY SEAL MISCELLANEOUS MISC: 1.0000 | Freq: Every morning | 5 refills | Status: DC

## 2023-09-18 NOTE — Patient Instructions (Addendum)
Dear Jane English,  Thank you for visiting [facility] English today. Your dedication to improving your health is commendable, and it's a pleasure to witness the progress you've made.  Here is a summary of the key instructions from today's consultation:  - Temporary Pause: Cease using the A.A. Gel and the minoxidil clobetasol for two to three weeks, then resume as directed.   - Application Timing: Apply the minoxidil clobetasol solely in the morning. This strategy is to prevent the product from transferring to your pillow, which could lead to undesired hair growth in unintended areas.   - Viviscal Supplements: Continue with your Viviscal supplements. Ensure they contain Biotin to avoid unnecessary duplication with other Biotin supplements.   - Maintenance Phase: Use the minoxidil clobetasol every other morning, as we are transitioning to the maintenance phase of your treatment.   - Prescription Refills: A new prescription will be issued to you with five refills for your convenience.   - Follow-Up: Our next appointment is scheduled for six months from now. The extension of the interval between visits is a testament to the effectiveness of your current treatment plan.  We are thrilled with the progress you've made and are eager to continue supporting you on your journey towards healthier hair.  Warm regards,  Dr. [facility]     Important Information  Due to recent changes in healthcare laws, you may see results of your pathology and/or laboratory studies on MyChart before the doctors have had a chance to review them. We understand that in some cases there may be results that are confusing or concerning to you. Please understand that not all results are received at the same time and often the doctors may need to interpret multiple results in order to provide you with the best plan of care or course of treatment. Therefore, we ask that you please give Jane English 2 business days to thoroughly review all your  results before contacting the office for clarification. Should we see a critical lab result, you will be contacted sooner.   If You Need Anything After Your Visit  If you have any questions or concerns for your doctor, please call our main line at [phone] If no one answers, please leave a voicemail as directed and we will return your call as soon as possible. Messages left after 4 pm will be answered the following business day.   You may also send Jane English a message via MyChart. We typically respond to MyChart messages within 1-2 business days.  For prescription refills, please ask your pharmacy to contact our office. Our fax number is [phone].  If you have an urgent issue when the clinic is closed that cannot wait until the next business day, you can page your doctor at the number below.    Please note that while we do our best to be available for urgent issues outside of office hours, we are not available 24/7.   If you have an urgent issue and are unable to reach Jane English, you may choose to seek medical care at your doctor's office, retail clinic, urgent care center, or emergency room.  If you have a medical emergency, please immediately call 911 or go to the emergency department. In the event of inclement weather, please call our main line at [phone] for an update on the status of any delays or closures.  Dermatology Medication Tips: Please keep the boxes that topical medications come in in order to help keep track of the instructions about where and how  to use these. Pharmacies typically print the medication instructions only on the boxes and not directly on the medication tubes.   If your medication is too expensive, please contact our office at [phone] or send Jane English a message through MyChart.   We are unable to tell what your co-pay for medications will be in advance as this is different depending on your insurance coverage. However, we may be able to find a substitute medication  at lower cost or fill out paperwork to get insurance to cover a needed medication.   If a prior authorization is required to get your medication covered by your insurance company, please allow Jane English 1-2 business days to complete this process.  Drug prices often vary depending on where the prescription is filled and some pharmacies may offer cheaper prices.  The website www.goodrx.com contains coupons for medications through different pharmacies. The prices here do not account for what the cost may be with help from insurance (it may be cheaper with your insurance), but the website can give you the price if you did not use any insurance.  - You can print the associated coupon and take it with your prescription to the pharmacy.  - You may also stop by our office during regular business hours and pick up a GoodRx coupon card.  - If you need your prescription sent electronically to a different pharmacy, notify our office through [facility] or by phone at [phone]

## 2023-09-18 NOTE — Progress Notes (Signed)
   Follow-Up Visit   Subjective  Jane English is a 52 y.o. female who presents for the following: alopecia  Patient present today for follow up visit for follow up. Patient was last evaluated on 05/21/23. She stated that she has been using the DermaSmooth, AA Gel, Vivascal and Collagen supplements. Patient reports sxs are better. She mentioned that she started taking an OTC biotin vitamin also in additional to the recommended regimen that was discussed during the last visit. Patient denies medication changes.  The following portions of the chart were reviewed this encounter and updated as appropriate: medications, allergies, medical history  Review of Systems:  No other skin or systemic complaints except as noted in HPI or Assessment and Plan.  Objective  Well appearing patient in no apparent distress; mood and affect are within normal limits.   A focused examination was performed of the following areas: scalp   Relevant exam findings are noted in the Assessment and Plan.             Assessment & Plan   Patient Jane P. presented with concerns about hair thinning and reported using A.A. Gel (minoxidil clobetasol) and Viviscal supplements. She experienced unwanted hair growth on the face due to nighttime application of the gel. The plan includes adjusting the application schedule, continuing Viviscal supplements, and checking for Biotin content to avoid over-supplementation. A follow-up appointment is scheduled in 6 months to monitor progress.  1. Androgenetic Alopecia - Assessment: Patient continues to experience hair thinning. - Plan:   - Continue application of A.A. Gel (minoxidil clobetasol), taking a 2-3 week break, then resuming every other morning to prevent further thinning and unwanted facial hair growth.   - Discontinue nighttime application to prevent potential hair growth on eyebrows and face.   - Continue Viviscal supplements for hair growth and strength. Check  for Biotin content in Viviscal; discontinue separate Biotin supplement if present.   - Schedule a follow-up in 6 months to monitor progress and adjust treatment as necessary.   - Provide a new prescription for A.A. Gel with five refills.  2. Patient Education - Assessment: Need for patient education on treatment application and encouragement on progress. - Plan:   - Educate patient on proper timing and frequency of A.A. Gel application to maximize effectiveness and minimize side effects.   - Encourage continuation of Viviscal supplements and collagen for hair growth and strength.   - Reassure patient of their progress and effectiveness of the current treatment plan.    Return in about 6 months (around 03/17/2024) for alopecia.    Documentation: I have reviewed the above documentation for accuracy and completeness, and I agree with the above.   I, Shirron Marcha Solders, CMA, am acting as scribe for [employer], DO.   Langston Reusing, DO

## 2023-09-20 DIAGNOSIS — M961 Postlaminectomy syndrome, not elsewhere classified: Secondary | ICD-10-CM | POA: Diagnosis not present

## 2023-09-22 ENCOUNTER — Encounter: Payer: Self-pay | Admitting: Registered Nurse

## 2023-09-22 ENCOUNTER — Encounter: Payer: Medicare HMO | Attending: Physical Medicine and Rehabilitation | Admitting: Registered Nurse

## 2023-09-22 ENCOUNTER — Encounter: Payer: Self-pay | Admitting: Internal Medicine

## 2023-09-22 VITALS — BP 122/72 | HR 85 | Ht 66.0 in | Wt 168.0 lb

## 2023-09-22 DIAGNOSIS — Z79899 Other long term (current) drug therapy: Secondary | ICD-10-CM | POA: Diagnosis not present

## 2023-09-22 DIAGNOSIS — G8929 Other chronic pain: Secondary | ICD-10-CM

## 2023-09-22 DIAGNOSIS — M2550 Pain in unspecified joint: Secondary | ICD-10-CM | POA: Diagnosis not present

## 2023-09-22 DIAGNOSIS — M5416 Radiculopathy, lumbar region: Secondary | ICD-10-CM | POA: Diagnosis not present

## 2023-09-22 DIAGNOSIS — Z5181 Encounter for therapeutic drug level monitoring: Secondary | ICD-10-CM

## 2023-09-22 DIAGNOSIS — M961 Postlaminectomy syndrome, not elsewhere classified: Secondary | ICD-10-CM | POA: Diagnosis not present

## 2023-09-22 DIAGNOSIS — M25511 Pain in right shoulder: Secondary | ICD-10-CM

## 2023-09-22 DIAGNOSIS — M25512 Pain in left shoulder: Secondary | ICD-10-CM

## 2023-09-22 DIAGNOSIS — G894 Chronic pain syndrome: Secondary | ICD-10-CM

## 2023-09-22 MED ORDER — OXYCODONE HCL 5 MG PO TABS: 10.0000 mg | ORAL_TABLET | Freq: Two times a day (BID) | ORAL | 0 refills | Status: DC

## 2023-09-22 NOTE — Progress Notes (Signed)
Subjective:    Patient ID: Jane English, female    DOB: 08-28-1971, 52 y.o.   MRN: [id number]  HPI: Jane English is a 52 y.o. female who returns for follow up appointment for chronic pain and medication refill. She states her pain is located in her bilateral shoulder pain, lower back pain and generalized joint pain. She rates her pain 8. Her current exercise regime is walking and performing stretching exercises.  Ms. Kuder Morphine equivalent is 30.00 MME.   Last UDS was Performed on 10//21/2024, it was consistent.    Pain Inventory Average Pain 9 Pain Right Now 8 My pain is sharp, burning, dull, stabbing, tingling, and aching  In the last 24 hours, has pain interfered with the following? General activity 8 Relation with others 8 Enjoyment of life 8 What TIME of day is your pain at its worst? morning , evening, and night Sleep (in general) Fair  Pain is worse with: unsure Pain improves with: medication Relief from Meds: 9  Family History  Problem Relation Age of Onset   Physical abuse Mother    Alcohol abuse Mother    Cirrhosis Mother    Lung cancer Father    Stomach cancer Father    Esophageal cancer Father    Alcohol abuse Father    Mental illness Father    Diabetes Sister    Hypertension Sister    Bipolar disorder Sister    Schizophrenia Sister    Diabetes Sister    Drug abuse Sister    HIV Sister    Pneumonia Sister        died as a baby   Alcohol abuse Brother    Hypertension Brother    Kidney disease Brother    Diabetes Brother    Drug abuse Brother    Mental illness Brother    Alcohol abuse Brother    Alcohol abuse Brother    Hypertension Brother    Diabetes Brother    Alcohol abuse Brother    Mental illness Brother        in [place]   Alcohol abuse Brother    Alcohol abuse Brother    Bipolar disorder Brother    Hypertension Brother    Bipolar disorder Brother    Drug abuse Brother    Alcohol abuse Brother    Bipolar disorder Brother    Bipolar  disorder Daughter    Bipolar disorder Son    Bipolar disorder Son    ADD / ADHD Neg Hx    Anxiety disorder Neg Hx    Dementia Neg Hx    Depression Neg Hx    OCD Neg Hx    Seizures Neg Hx    Paranoid behavior Neg Hx    Colon cancer Neg Hx    Rectal cancer Neg Hx    Social History   Socioeconomic History   Marital status: Married    Spouse name: Molly Maduro   Number of children: 4   Years of education: 12   Highest education level: GED or equivalent  Occupational History   Occupation: disabled  Tobacco Use   Smoking status: Never    Passive exposure: Never   Smokeless tobacco: Never  Vaping Use   Vaping status: Never Used  Substance and Sexual Activity   Alcohol use: No   Drug use: No   Sexual activity: Not Currently    Partners: Male    Birth control/protection: Surgical    Comment: tubal  Other Topics Concern  Not on file  Social History Narrative   Not on file   Social Determinants of Health   Financial Resource Strain: Medium Risk (03/11/2023)   Overall Financial Resource Strain (CARDIA)    Difficulty of Paying Living Expenses: Somewhat hard  Food Insecurity: No Food Insecurity (03/11/2023)   Hunger Vital Sign    Worried About Running Out of Food in the Last Year: Never true    Ran Out of Food in the Last Year: Never true  Transportation Needs: No Transportation Needs (03/11/2023)   PRAPARE - Administrator, Civil Service (Medical): No    Lack of Transportation (Non-Medical): No  Physical Activity: Unknown (03/11/2023)   Exercise Vital Sign    Days of Exercise per Week: Patient declined    Minutes of Exercise per Session: Not on file  Stress: No Stress Concern Present (03/11/2023)   [employer] of Occupational Health - Occupational Stress Questionnaire    Feeling of Stress : Only a little  Social Connections: Socially Integrated (03/11/2023)   Social Connection and Isolation Panel [NHANES]    Frequency of Communication with Friends and Family: More  than three times a week    Frequency of Social Gatherings with Friends and Family: Patient declined    Attends Religious Services: More than 4 times per year    Active Member of Clubs or Organizations: Yes    Attends Banker Meetings: 1 to 4 times per year    Marital Status: Married   Past Surgical History:  Procedure Laterality Date   ANTERIOR CERVICAL DECOMP/DISCECTOMY FUSION  07/07/2012   Procedure: ANTERIOR CERVICAL DECOMPRESSION/DISCECTOMY FUSION 2 LEVELS;  Surgeon: Karn Cassis, MD;  Location: [facility];  Service: Neurosurgery;  Laterality: N/A;  Cervical four-five, five - six  Anterior cervical decompression/diskectomy/fusion/plate   APPENDECTOMY  11/04/1984   BOWEL RESECTION N/A 07/29/2013   Procedure: serosal repair;  Surgeon: Ardeth Sportsman, MD;  Location: [facility];  Service: General;  Laterality: N/A;   CARPAL TUNNEL RELEASE Bilateral    COLON SURGERY N/A    Phreesia 07/29/2020   LAPAROSCOPY N/A 07/29/2013   Procedure: diagnostic laporoscopy;  Surgeon: Ardeth Sportsman, MD;  Location: [facility];  Service: General;  Laterality: N/A;   LAPAROSCOPY N/A 08/16/2013   Procedure: LAPAROSCOPY DIAGNOSTIC/LYSIS OF ADHESIONS;  Surgeon: Ardeth Sportsman, MD;  Location: [facility];  Service: General;  Laterality: N/A;   LAPAROTOMY N/A 08/16/2013   Procedure: EXPLORATORY LAPAROTOMY/SMALL BOWEL RESECTION (JEJUNUM);  Surgeon: Ardeth Sportsman, MD;  Location: [facility];  Service: General;  Laterality: N/A;   LUMBAR SPINE SURGERY  11/04/2008   x 3   LYSIS OF ADHESION  11/04/2001   Dr. Elpidio Anis   LYSIS OF ADHESION N/A 07/29/2013   Procedure: LYSIS OF ADHESION;  Surgeon: Ardeth Sportsman, MD;  Location: [facility];  Service: General;  Laterality: N/A;   OOPHORECTOMY     PARTIAL HYSTERECTOMY  1990s?   [place], [place]   SPINAL CORD STIMULATOR IMPLANT     spine stimulator removal  12/13/2022   SPINE SURGERY N/A    Phreesia 07/29/2020   TRIGGER FINGER RELEASE  11/05/2007   right pinkie  finger   TUBAL LIGATION  11/04/1992   Past Surgical History:  Procedure Laterality Date   ANTERIOR CERVICAL DECOMP/DISCECTOMY FUSION  07/07/2012   Procedure: ANTERIOR CERVICAL DECOMPRESSION/DISCECTOMY FUSION 2 LEVELS;  Surgeon: Karn Cassis, MD;  Location: [facility];  Service: Neurosurgery;  Laterality: N/A;  Cervical four-five, five -  six  Anterior cervical decompression/diskectomy/fusion/plate   APPENDECTOMY  11/04/1984   BOWEL RESECTION N/A 07/29/2013   Procedure: serosal repair;  Surgeon: Ardeth Sportsman, MD;  Location: [facility];  Service: General;  Laterality: N/A;   CARPAL TUNNEL RELEASE Bilateral    COLON SURGERY N/A    Phreesia 07/29/2020   LAPAROSCOPY N/A 07/29/2013   Procedure: diagnostic laporoscopy;  Surgeon: Ardeth Sportsman, MD;  Location: [facility];  Service: General;  Laterality: N/A;   LAPAROSCOPY N/A 08/16/2013   Procedure: LAPAROSCOPY DIAGNOSTIC/LYSIS OF ADHESIONS;  Surgeon: Ardeth Sportsman, MD;  Location: [facility];  Service: General;  Laterality: N/A;   LAPAROTOMY N/A 08/16/2013   Procedure: EXPLORATORY LAPAROTOMY/SMALL BOWEL RESECTION (JEJUNUM);  Surgeon: Ardeth Sportsman, MD;  Location: [facility];  Service: General;  Laterality: N/A;   LUMBAR SPINE SURGERY  11/04/2008   x 3   LYSIS OF ADHESION  11/04/2001   Dr. Elpidio Anis   LYSIS OF ADHESION N/A 07/29/2013   Procedure: LYSIS OF ADHESION;  Surgeon: Ardeth Sportsman, MD;  Location: [facility];  Service: General;  Laterality: N/A;   OOPHORECTOMY     PARTIAL HYSTERECTOMY  1990s?   [place], [place]   SPINAL CORD STIMULATOR IMPLANT     spine stimulator removal  12/13/2022   SPINE SURGERY N/A    Phreesia 07/29/2020   TRIGGER FINGER RELEASE  11/05/2007   right pinkie finger   TUBAL LIGATION  11/04/1992   Past Medical History:  Diagnosis Date   Anemia    Asthma    Asthma flare 04/09/2013   Back pain    Bronchitis    Chronic abdominal pain    Chronic constipation    Constipation due to opioid therapy    Depression     Depression, major, single episode, severe (HCC) 10/03/2018   PHQ 9 score of 15   Diabetes mellitus without complication (HCC)    Diabetes mellitus, type II (HCC)    DVT (deep venous thrombosis) (HCC) 2010   GERD (gastroesophageal reflux disease)    Heart murmur    no cardiologist   Helicobacter pylori gastritis 06/11/2013   Colonoscopy Dr. Rhea Belton   Hypertension    IBS (irritable bowel syndrome)    Migraine headache    Neuropathy    Obesity    Obsessive-compulsive disorder    PSYCHOTIC D/O W/HALLUCINATIONS CONDS CLASS ELSW 03/04/2010   Qualifier: Diagnosis of  By: Lodema Hong MD, Margaret     PTSD (post-traumatic stress disorder)    SBO (small bowel obstruction) (HCC) 08/09/2013   Seasonal allergies 12/10/2012   Seizures (HCC)    Shortness of breath    There were no vitals taken for this visit.  Opioid Risk Score:   Fall Risk Score:  `1  Depression screen [facility] 2/9     08/25/2023    3:03 PM 07/01/2023    8:36 AM 06/27/2023   10:00 AM 05/07/2023    9:01 AM 04/22/2023    3:00 PM 04/08/2023    9:19 AM 04/07/2023    2:05 PM  Depression screen PHQ 2/9  Decreased Interest 0 0 0 1 2 0 2  Down, Depressed, Hopeless 0 0 0 1 2 0 1  PHQ - 2 Score 0 0 0 2 4 0 3  Altered sleeping     0  1  Tired, decreased energy     0  2  Change in appetite     0  2  Feeling bad or failure about yourself  2  1  Trouble concentrating     0  0  Moving slowly or fidgety/restless     0  0  Suicidal thoughts     0  0  PHQ-9 Score     6  9  Difficult doing work/chores     Not difficult at all  Somewhat difficult      Review of Systems  Musculoskeletal:  Positive for back pain and gait problem.       R Knee pain B/L shoulder, hand, foot pain   All other systems reviewed and are negative.     Objective:   Physical Exam Vitals and nursing note reviewed.  Constitutional:      Appearance: Normal appearance.  Cardiovascular:     Rate and Rhythm: Normal rate and regular rhythm.     Pulses: Normal pulses.      Heart sounds: Normal heart sounds.  Pulmonary:     Effort: Pulmonary effort is normal.     Breath sounds: Normal breath sounds.  Musculoskeletal:     Comments: Normal Muscle Bulk and Muscle Testing Reveals:  Upper Extremities: Full ROM and Muscle Strength  5/5 Bilateral AC Joint Tenderness  Lumbar Paraspinal Tenderness: L-4-L-5 Lower Extremities: Full ROM and Muscle Strength 5/5 Bilateral Lower Extremities Flexion Produces Pain into Her Bilateral Patella's  Arises from Table with ease Narrow Based Gait     Skin:    General: Skin is warm and dry.  Neurological:     Mental Status: She is alert and oriented to person, place, and time.  Psychiatric:        Mood and Affect: Mood normal.        Behavior: Behavior normal.         Assessment & Plan:  1.Chronic Pain of Bilateral Shoulders L>R: Continue to alternate Ice and Heat therapy. Continue HEP as Tolerated. Continue to Monitor. 09/22/2023 2. Failed Back Syndrome of Lumbar Syndrome : Continue HEP as Tolerated. Continue current medication regimen. Continue to Monitor. 09/22/2023 3. Lumbar Radiculitis: Continue HEP as Tolerated. Continue to Monitor.09/22/2023 4. Left Knee Pain: No Complaints today. Continue HEP as Tolerated. Continue to Monitor.09/22/2023 5. Muscle Spasm of Shoulders/ Upper Back : Continue Flexeril. Continue to Monitor.09/22/2023 6. Chronic Pain Syndrome: Refilled: Oxycodone 5mg /325 mg two tablets twice a day #120. We will continue the opioid monitoring program, this consists of regular clinic visits, examinations, urine drug screen, pill counts as well as use of [place] Controlled Substance Reporting system. A 12 month History has been reviewed on the [place] Controlled Substance Reporting System on 09/22/2023. 7. Polyarthralgia: Continue HEP as Tolerated. Continue current medication regimen. Continue to monitor.    F/U in 1 month

## 2023-09-29 ENCOUNTER — Other Ambulatory Visit: Payer: Self-pay | Admitting: Physical Medicine and Rehabilitation

## 2023-09-30 DIAGNOSIS — M961 Postlaminectomy syndrome, not elsewhere classified: Secondary | ICD-10-CM | POA: Diagnosis not present

## 2023-10-08 ENCOUNTER — Other Ambulatory Visit: Payer: Self-pay | Admitting: Family Medicine

## 2023-10-08 ENCOUNTER — Telehealth: Payer: Self-pay | Admitting: Registered Nurse

## 2023-10-08 ENCOUNTER — Other Ambulatory Visit (HOSPITAL_COMMUNITY): Payer: Self-pay

## 2023-10-08 ENCOUNTER — Encounter: Payer: Medicare HMO | Attending: Physical Medicine and Rehabilitation | Admitting: Physical Medicine and Rehabilitation

## 2023-10-08 DIAGNOSIS — F32A Depression, unspecified: Secondary | ICD-10-CM | POA: Insufficient documentation

## 2023-10-08 DIAGNOSIS — L739 Follicular disorder, unspecified: Secondary | ICD-10-CM | POA: Insufficient documentation

## 2023-10-08 DIAGNOSIS — I7300 Raynaud's syndrome without gangrene: Secondary | ICD-10-CM | POA: Insufficient documentation

## 2023-10-08 DIAGNOSIS — Z760 Encounter for issue of repeat prescription: Secondary | ICD-10-CM | POA: Insufficient documentation

## 2023-10-08 DIAGNOSIS — M7918 Myalgia, other site: Secondary | ICD-10-CM | POA: Insufficient documentation

## 2023-10-08 DIAGNOSIS — G894 Chronic pain syndrome: Secondary | ICD-10-CM | POA: Insufficient documentation

## 2023-10-08 DIAGNOSIS — Z6827 Body mass index (BMI) 27.0-27.9, adult: Secondary | ICD-10-CM | POA: Insufficient documentation

## 2023-10-08 DIAGNOSIS — E663 Overweight: Secondary | ICD-10-CM | POA: Insufficient documentation

## 2023-10-08 DIAGNOSIS — G4700 Insomnia, unspecified: Secondary | ICD-10-CM | POA: Insufficient documentation

## 2023-10-08 DIAGNOSIS — M19049 Primary osteoarthritis, unspecified hand: Secondary | ICD-10-CM | POA: Diagnosis not present

## 2023-10-08 DIAGNOSIS — Z638 Other specified problems related to primary support group: Secondary | ICD-10-CM | POA: Insufficient documentation

## 2023-10-08 DIAGNOSIS — W19XXXA Unspecified fall, initial encounter: Secondary | ICD-10-CM | POA: Insufficient documentation

## 2023-10-08 MED ORDER — OXYCODONE HCL 5 MG PO TABS
10.0000 mg | ORAL_TABLET | Freq: Two times a day (BID) | ORAL | 0 refills | Status: DC
  Filled 2023-10-08: qty 120, 30d supply, fill #0

## 2023-10-08 NOTE — Telephone Encounter (Signed)
PMP was Reviewed.  Jane English called Jane English, reporting [facility] is out of stock of her Oxycodone. [facility] was called, message left on voicemail to cancel prescription.  Oxycodone e-scribe to [facility] as she requested.Sent a My-Chart message to Jane English regarding the above.

## 2023-10-08 NOTE — Progress Notes (Signed)
B   Subjective:    Patient ID: Jane English, female    DOB: [birth year], 52 y.o.   MRN: [id number]  HPI An audio/video tele-health visit is felt to be the most appropriate encounter for this patient at this time. This is a follow up tele-visit via phone. The patient is at home. MD is at office. Prior to scheduling this appointment, our staff discussed the limitations of evaluation and management by telemedicine and the availability of in-person appointments. The patient expressed understanding and agreed to proceed.   Jane English is a 52 year old woman who presents for f/u of failed back syndrome and history of obesity, now overweight.  1) Overweight, history of obesity Continues to lose weight -she cut out junk food -tries to stay away from fried foods BMI 27.70 today, weight 171lbs  2) Failed back syndrome: -needs refill of her oxycodone, needs this to be sent to a different pharmacy as her current does not have this in stock -has been using turmeric and collagen -needs refill of oxycodone and amitriptyline today -had stimulator removed and site has been hurting since then -stents removed as well.  -going back to see him on the 13th -no signs of infection -staples removed.  -returning to work today -needs refill of oxycodone  3) insomnia -continues to sleep poorly at night  4) depression -has been having some depression but pushes through  5) Pustules on right side of head -saw her PCP yesterday and was prescribed doxycycline -it dried her hair and it made her head itch -she was told it is caused by sweating -she asks why she got folliculiits  6) Work stress: -discussed that she has a very difficult charge who has been hurting herself  7) Hand arthritis: -she asks about a new hand arthritis cream  Pain Inventory Average Pain 10 Pain Right Now 8 My pain is intermittent, constant, sharp, burning, dull, stabbing, tingling, and aching  In the last 24 hours, has pain  interfered with the following? General activity 8 Relation with others 8 Enjoyment of life 8 What TIME of day is your pain at its worst? morning , evening, and night Sleep (in general) Poor  Pain is worse with: walking, bending, sitting, inactivity, standing, and some activites Pain improves with: rest and medication Relief from Meds: good  Family History  Problem Relation Age of Onset   Physical abuse Mother    Alcohol abuse Mother    Cirrhosis Mother    Lung cancer Father    Stomach cancer Father    Esophageal cancer Father    Alcohol abuse Father    Mental illness Father    Diabetes Sister    Hypertension Sister    Bipolar disorder Sister    Schizophrenia Sister    Diabetes Sister    Drug abuse Sister    HIV Sister    Pneumonia Sister        died as a baby   Alcohol abuse Brother    Hypertension Brother    Kidney disease Brother    Diabetes Brother    Drug abuse Brother    Mental illness Brother    Alcohol abuse Brother    Alcohol abuse Brother    Hypertension Brother    Diabetes Brother    Alcohol abuse Brother    Mental illness Brother        in [place]   Alcohol abuse Brother    Alcohol abuse Brother    Bipolar disorder Brother  Hypertension Brother    Bipolar disorder Brother    Drug abuse Brother    Alcohol abuse Brother    Bipolar disorder Brother    Bipolar disorder Daughter    Bipolar disorder Son    Bipolar disorder Son    ADD / ADHD Neg Hx    Anxiety disorder Neg Hx    Dementia Neg Hx    Depression Neg Hx    OCD Neg Hx    Seizures Neg Hx    Paranoid behavior Neg Hx    Colon cancer Neg Hx    Rectal cancer Neg Hx    Social History   Socioeconomic History   Marital status: Married    Spouse name: Molly Maduro   Number of children: 4   Years of education: 12   Highest education level: GED or equivalent  Occupational History   Occupation: disabled  Tobacco Use   Smoking status: Never    Passive exposure: Never   Smokeless tobacco: Never   Vaping Use   Vaping status: Never Used  Substance and Sexual Activity   Alcohol use: No   Drug use: No   Sexual activity: Not Currently    Partners: Male    Birth control/protection: Surgical    Comment: tubal  Other Topics Concern   Not on file  Social History Narrative   Not on file   Social Determinants of Health   Financial Resource Strain: Medium Risk (03/11/2023)   Overall Financial Resource Strain (CARDIA)    Difficulty of Paying Living Expenses: Somewhat hard  Food Insecurity: No Food Insecurity (03/11/2023)   Hunger Vital Sign    Worried About Running Out of Food in the Last Year: Never true    Ran Out of Food in the Last Year: Never true  Transportation Needs: No Transportation Needs (03/11/2023)   PRAPARE - Administrator, Civil Service (Medical): No    Lack of Transportation (Non-Medical): No  Physical Activity: Unknown (03/11/2023)   Exercise Vital Sign    Days of Exercise per Week: Patient declined    Minutes of Exercise per Session: Not on file  Stress: No Stress Concern Present (03/11/2023)   [employer] of Occupational Health - Occupational Stress Questionnaire    Feeling of Stress : Only a little  Social Connections: Socially Integrated (03/11/2023)   Social Connection and Isolation Panel [NHANES]    Frequency of Communication with Friends and Family: More than three times a week    Frequency of Social Gatherings with Friends and Family: Patient declined    Attends Religious Services: More than 4 times per year    Active Member of Clubs or Organizations: Yes    Attends Banker Meetings: 1 to 4 times per year    Marital Status: Married   Past Surgical History:  Procedure Laterality Date   ANTERIOR CERVICAL DECOMP/DISCECTOMY FUSION  07/07/2012   Procedure: ANTERIOR CERVICAL DECOMPRESSION/DISCECTOMY FUSION 2 LEVELS;  Surgeon: Karn Cassis, MD;  Location: [facility];  Service: Neurosurgery;  Laterality: N/A;  Cervical four-five,  five - six  Anterior cervical decompression/diskectomy/fusion/plate   APPENDECTOMY  11/04/1984   BOWEL RESECTION N/A 07/29/2013   Procedure: serosal repair;  Surgeon: Ardeth Sportsman, MD;  Location: [facility];  Service: General;  Laterality: N/A;   CARPAL TUNNEL RELEASE Bilateral    COLON SURGERY N/A    Phreesia 07/29/2020   LAPAROSCOPY N/A 07/29/2013   Procedure: diagnostic laporoscopy;  Surgeon: Ardeth Sportsman, MD;  Location: [facility];  Service: General;  Laterality: N/A;   LAPAROSCOPY N/A 08/16/2013   Procedure: LAPAROSCOPY DIAGNOSTIC/LYSIS OF ADHESIONS;  Surgeon: Ardeth Sportsman, MD;  Location: [facility];  Service: General;  Laterality: N/A;   LAPAROTOMY N/A 08/16/2013   Procedure: EXPLORATORY LAPAROTOMY/SMALL BOWEL RESECTION (JEJUNUM);  Surgeon: Ardeth Sportsman, MD;  Location: [facility];  Service: General;  Laterality: N/A;   LUMBAR SPINE SURGERY  11/04/2008   x 3   LYSIS OF ADHESION  11/04/2001   Dr. Elpidio Anis   LYSIS OF ADHESION N/A 07/29/2013   Procedure: LYSIS OF ADHESION;  Surgeon: Ardeth Sportsman, MD;  Location: [facility];  Service: General;  Laterality: N/A;   OOPHORECTOMY     PARTIAL HYSTERECTOMY  1990s?   [place], [place]   SPINAL CORD STIMULATOR IMPLANT     spine stimulator removal  12/13/2022   SPINE SURGERY N/A    Phreesia 07/29/2020   TRIGGER FINGER RELEASE  11/05/2007   right pinkie finger   TUBAL LIGATION  11/04/1992   Past Surgical History:  Procedure Laterality Date   ANTERIOR CERVICAL DECOMP/DISCECTOMY FUSION  07/07/2012   Procedure: ANTERIOR CERVICAL DECOMPRESSION/DISCECTOMY FUSION 2 LEVELS;  Surgeon: Karn Cassis, MD;  Location: [facility];  Service: Neurosurgery;  Laterality: N/A;  Cervical four-five, five - six  Anterior cervical decompression/diskectomy/fusion/plate   APPENDECTOMY  11/04/1984   BOWEL RESECTION N/A 07/29/2013   Procedure: serosal repair;  Surgeon: Ardeth Sportsman, MD;  Location: [facility];  Service: General;  Laterality: N/A;   CARPAL TUNNEL  RELEASE Bilateral    COLON SURGERY N/A    Phreesia 07/29/2020   LAPAROSCOPY N/A 07/29/2013   Procedure: diagnostic laporoscopy;  Surgeon: Ardeth Sportsman, MD;  Location: [facility];  Service: General;  Laterality: N/A;   LAPAROSCOPY N/A 08/16/2013   Procedure: LAPAROSCOPY DIAGNOSTIC/LYSIS OF ADHESIONS;  Surgeon: Ardeth Sportsman, MD;  Location: [facility];  Service: General;  Laterality: N/A;   LAPAROTOMY N/A 08/16/2013   Procedure: EXPLORATORY LAPAROTOMY/SMALL BOWEL RESECTION (JEJUNUM);  Surgeon: Ardeth Sportsman, MD;  Location: [facility];  Service: General;  Laterality: N/A;   LUMBAR SPINE SURGERY  11/04/2008   x 3   LYSIS OF ADHESION  11/04/2001   Dr. Elpidio Anis   LYSIS OF ADHESION N/A 07/29/2013   Procedure: LYSIS OF ADHESION;  Surgeon: Ardeth Sportsman, MD;  Location: [facility];  Service: General;  Laterality: N/A;   OOPHORECTOMY     PARTIAL HYSTERECTOMY  1990s?   [place], [place]   SPINAL CORD STIMULATOR IMPLANT     spine stimulator removal  12/13/2022   SPINE SURGERY N/A    Phreesia 07/29/2020   TRIGGER FINGER RELEASE  11/05/2007   right pinkie finger   TUBAL LIGATION  11/04/1992   Past Medical History:  Diagnosis Date   Anemia    Asthma    Asthma flare 04/09/2013   Back pain    Bronchitis    Chronic abdominal pain    Chronic constipation    Constipation due to opioid therapy    Depression    Depression, major, single episode, severe (HCC) 10/03/2018   PHQ 9 score of 15   Diabetes mellitus without complication (HCC)    Diabetes mellitus, type II (HCC)    DVT (deep venous thrombosis) (HCC) 2010   GERD (gastroesophageal reflux disease)    Heart murmur    no cardiologist   Helicobacter pylori gastritis 06/11/2013   Colonoscopy Dr. Rhea Belton   Hypertension    IBS (irritable bowel syndrome)  Migraine headache    Neuropathy    Obesity    Obsessive-compulsive disorder    PSYCHOTIC D/O W/HALLUCINATIONS CONDS CLASS ELSW 03/04/2010   Qualifier: Diagnosis of  By: Lodema Hong MD, Claris Che      PTSD (post-traumatic stress disorder)    SBO (small bowel obstruction) (HCC) 08/09/2013   Seasonal allergies 12/10/2012   Seizures (HCC)    Shortness of breath    There were no vitals taken for this visit.  Opioid Risk Score:   Fall Risk Score:  `1  Depression screen [facility] 2/9     09/22/2023    2:43 PM 08/25/2023    3:03 PM 07/01/2023    8:36 AM 06/27/2023   10:00 AM 05/07/2023    9:01 AM 04/22/2023    3:00 PM 04/08/2023    9:19 AM  Depression screen PHQ 2/9  Decreased Interest 0 0 0 0 1 2 0  Down, Depressed, Hopeless 0 0 0 0 1 2 0  PHQ - 2 Score 0 0 0 0 2 4 0  Altered sleeping      0   Tired, decreased energy      0   Change in appetite      0   Feeling bad or failure about yourself       2   Trouble concentrating      0   Moving slowly or fidgety/restless      0   Suicidal thoughts      0   PHQ-9 Score      6   Difficult doing work/chores      Not difficult at all      Review of Systems  Constitutional: Negative.   HENT: Negative.    Eyes: Negative.   Respiratory: Negative.    Cardiovascular: Negative.   Gastrointestinal: Negative.   Endocrine: Negative.   Genitourinary: Negative.   Musculoskeletal:  Positive for back pain.       Bilateral shoulder pain Right knee pain Bilateral feet  Skin: Negative.   Allergic/Immunologic: Negative.   Neurological: Negative.   Hematological: Negative.   Psychiatric/Behavioral: Negative.    All other systems reviewed and are negative.      Objective:   Physical Exam PRIOR EXAM Gen: no distress, normal appearing, BMI 27.70, weight 172 lbs HEENT: oral mucosa pink and moist, NCAT Cardio: Reg rate Chest: normal effort, normal rate of breathing Abd: soft, non-distended Ext: no edema Psych: pleasant, normal affect Skin: intact Neuro: Alert and oriented x3, ambulating normally MSK: tight cervical myofasica, TTP lower back bilaterally    Assessment & Plan:   1) Failed back syndrome -refilled oxycodone and amitriptyline,  alerted Riley Lam that patient's pharmacy does not have oxycodone in stock and requested that she send medication to hospital pharmacy -UDS ordered previously -encouraged follow-up with surgeon regarding pain after spinal cord stimulator removal -Provided with a pain relief journal and discussed that it contains foods and lifestyle tips to naturally help to improve pain. Discussed that these lifestyle strategies are also very good for health unlike some medications which can have negative side effects. Discussed that the act of keeping a journal can be therapeutic and helpful to realize patterns what helps to trigger and alleviate pain.  . -Discussed current symptoms of pain and history of pain.  -Discussed benefits of exercise in reducing pain. -Discussed following foods that may reduce pain: 1) Ginger (especially studied for arthritis)- reduce leukotriene production to decrease inflammation 2) Blueberries- high in phytonutrients that decrease inflammation 3)  Salmon- marine omega-3s reduce joint swelling and pain 4) Pumpkin seeds- reduce inflammation 5) dark chocolate- reduces inflammation 6) turmeric- reduces inflammation 7) tart cherries - reduce pain and stiffness 8) extra virgin olive oil - its compound olecanthal helps to block prostaglandins  9) chili peppers- can be eaten or applied topically via capsaicin 10) mint- helpful for headache, muscle aches, joint pain, and itching 11) garlic- reduces inflammation  Link to further information on diet for chronic pain: http://www.bray.com/   Turmeric to reduce inflammation--can be used in cooking or taken as a supplement.  Benefits of turmeric:  -Highly anti-inflammatory  -Increases antioxidants  -Improves memory, attention, brain disease  -Lowers risk of heart disease  -May help prevent cancer  -Decreases pain  -Alleviates depression  -Delays aging and  decreases risk of chronic disease  -Consume with black pepper to increase absorption   2) Overweight, history of obesity -discussed slight weight increase -provided dietary education -continue Mounjaro -discussed her current diet  3) Insomnia: -continue amitriptyline -Try to go outside near sunrise -Get exercise during the day.  -Turn off all devices an hour before bedtime.  -Teas that can benefit: chamomile, valerian root, Brahmi (Bacopa) -Can consider over the counter melatonin, magnesium, and/or L-theanine. Melatonin is an anti-oxidant with multiple health benefits. Magnesium is involved in greater than 300 enzymatic reactions in the body and most of [place] are deficient as our soil is often depleted. There are 7 different types of magnesium- Bioptemizer's is a supplement with all 7 types, and each has unique benefits. Magnesium can also help with constipation and anxiety.  -Pistachios naturally increase the production of melatonin -[employer]zy [employer] bamboo bed sheets are free from toxic chemicals.  -Tart cherry juice or a tart cherry supplement can improve sleep and soreness post-workout    4) Depression: -discussed plan to move in with her son at [facility]. -discussed her resilience -discussed that her husband wants to come back and she is ok with this -discussed that she does not agree with leaving someone just because you have a disgreement  5)  Folliculitis: -previously took doxyclycine, discussed how this can negatively affect the gut microbiome -recommended biotin -recommended rosemary -continue peppermint and tea tree oil  6) Cervical myofascial pain syndrome: -discussed trigger point injections but she defers  7) Family stress: -discussed that she is continuing with plan to divorce her husband  8) Hand arthritis -discussed new cream she would like to try for this  5 minutes spent in discussion that her pharmacy does not have oxycodone in stock, messaged Riley Lam to let her know  and to request that her medication be sent to hospital pharmacy, discussed new cream that she would like to try for her hand arthritis, due to difficulty hearing the name of this cream via phone, asked her to message me the name of the cream she would like to try

## 2023-10-10 ENCOUNTER — Encounter (HOSPITAL_COMMUNITY): Payer: Self-pay

## 2023-10-10 ENCOUNTER — Telehealth (HOSPITAL_COMMUNITY): Payer: Self-pay

## 2023-10-10 ENCOUNTER — Encounter (HOSPITAL_COMMUNITY): Payer: Self-pay | Admitting: Psychiatry

## 2023-10-10 NOTE — Telephone Encounter (Signed)
Lvm to r/s 10/14/23 appt sending mychart message

## 2023-10-14 ENCOUNTER — Encounter (HOSPITAL_COMMUNITY): Payer: Self-pay

## 2023-10-14 ENCOUNTER — Encounter: Payer: Self-pay | Admitting: Family Medicine

## 2023-10-14 ENCOUNTER — Ambulatory Visit (HOSPITAL_COMMUNITY): Payer: Medicare HMO | Admitting: Psychiatry

## 2023-10-15 NOTE — Telephone Encounter (Signed)
Spoke with patient, appt scheduled. Pt also added to wait list for sooner appt

## 2023-10-16 ENCOUNTER — Ambulatory Visit: Payer: Self-pay | Admitting: Family Medicine

## 2023-10-16 ENCOUNTER — Telehealth: Payer: Medicare HMO | Admitting: Family Medicine

## 2023-10-16 ENCOUNTER — Encounter: Payer: Self-pay | Admitting: Family Medicine

## 2023-10-16 DIAGNOSIS — H04123 Dry eye syndrome of bilateral lacrimal glands: Secondary | ICD-10-CM

## 2023-10-16 DIAGNOSIS — J019 Acute sinusitis, unspecified: Secondary | ICD-10-CM | POA: Insufficient documentation

## 2023-10-16 DIAGNOSIS — J209 Acute bronchitis, unspecified: Secondary | ICD-10-CM | POA: Diagnosis not present

## 2023-10-16 DIAGNOSIS — J0140 Acute pansinusitis, unspecified: Secondary | ICD-10-CM

## 2023-10-16 MED ORDER — PROMETHAZINE-DM 6.25-15 MG/5ML PO SYRP: ORAL_SOLUTION | ORAL | 0 refills | Status: DC

## 2023-10-16 MED ORDER — BENZONATATE 100 MG PO CAPS: 100.0000 mg | ORAL_CAPSULE | Freq: Two times a day (BID) | ORAL | 0 refills | Status: DC | PRN

## 2023-10-16 MED ORDER — RESTASIS 0.05 % OP EMUL: 1.0000 [drp] | Freq: Two times a day (BID) | OPHTHALMIC | 0 refills | Status: DC

## 2023-10-16 MED ORDER — SULFAMETHOXAZOLE-TRIMETHOPRIM 800-160 MG PO TABS
1.0000 | ORAL_TABLET | Freq: Two times a day (BID) | ORAL | 0 refills | Status: DC
Start: 2023-10-16 — End: 2023-12-15

## 2023-10-16 MED ORDER — FLUCONAZOLE 150 MG PO TABS: 150.0000 mg | ORAL_TABLET | Freq: Once | ORAL | 0 refills | Status: AC

## 2023-10-16 MED ORDER — PREDNISONE 5 MG PO TABS: 5.0000 mg | ORAL_TABLET | Freq: Two times a day (BID) | ORAL | 0 refills | Status: AC

## 2023-10-16 NOTE — Progress Notes (Signed)
Virtual Visit via Video Note  I connected with Jane English on 10/16/23 at 11:20 AM EST by a video enabled telemedicine application and verified that I am speaking with the correct person using two identifiers.  Location: Patient: home Provider: office   I discussed the limitations of evaluation and management by telemedicine and the availability of in person appointments. The patient expressed understanding and agreed to proceed.  History of Present Illness: 4 day h/o head and chest congestion, intermittent fever and chills, sore throat, yellow drainage form sinuses, coughing up ream sputum and wheezing No known sick contact C/o dry eyes and requests medication for this   Observations/Objective: There were no vitals taken for this visit. Ill appearing, head ad chest congestion noted during visit No exudate in oropharymx , reports tender glands in neck  Assessment and Plan:  Acute bronchitis Septra , tessalon perles, prednisone , phenergan dm and fluconazole are prescribed  Acute sinusitis Septra x 10 days  Dry eyes, bilateral Restasis is prescribed  Follow Up Instructions:    I discussed the assessment and treatment plan with the patient. The patient was provided an opportunity to ask questions and all were answered. The patient agreed with the plan and demonstrated an understanding of the instructions.   The patient was advised to call back or seek an in-person evaluation if the symptoms worsen or if the condition fails to improve as anticipated.  I provided 12 minutes of non-face-to-face time during this encounter.   Syliva Overman, MD

## 2023-10-16 NOTE — Assessment & Plan Note (Signed)
Septra , tessalon perles, prednisone , phenergan dm and fluconazole are prescribed

## 2023-10-16 NOTE — Assessment & Plan Note (Signed)
Septra x 10 days

## 2023-10-16 NOTE — Telephone Encounter (Signed)
Reason for Disposition . [1] Chest pain AND [2] difficulty breathing  Answer Assessment - Initial Assessment Questions 1. ONSET: "When did the cough begin?"      Pt reporting that the cough began on Monday 2. SEVERITY: "How bad is the cough today?" "Did the blood appear after a coughing spell?"      Pt reporting that she is having coughing fits, nurse heard coughing fit on phone call. Pt reporting that she coughed up mucus with "little blood." 3. SPUTUM: "Describe the color of your sputum" (none, dry cough; clear, white, yellow, green)     Pt describing sputum as "dark brown, bloody mucus," and some "dark yellow with blood in it." Pt clarifying "not a lot of blood." 4. HEMOPTYSIS: "How much blood?" (flecks, streaks, tablespoons, etc.)     Pt reporting "not a lot of blood," "a little." 5. DIFFICULTY BREATHING: "Are you having difficulty breathing?" If Yes, ask: "How bad is it?" (e.g., mild, moderate, severe)    - MILD: No SOB at rest, mild SOB with walking, speaks normally in sentences, can lie down, no retractions, pulse < 100.    - MODERATE: SOB at rest, SOB with minimal exertion and prefers to sit, cannot lie down flat, speaks in phrases, mild retractions, audible wheezing, pulse 100-120.    - SEVERE: Very SOB at rest, speaks in single words, struggling to breathe, sitting hunched forward, retractions, pulse > 120      Pt reporting trouble breathing, needing to use her "nebulizer, all the inhalers," and that she did these right before calling and "will do them again when I hang up." Pt is very difficult to hear, partially because voice "coming and going."  Pt is refusing to answer further assessment questions.  Protocols used: Coughing Up Blood-A-AH

## 2023-10-16 NOTE — Telephone Encounter (Signed)
Copied from CRM [id number]. Topic: Clinical - Medical Advice >> Oct 16, 2023  8:21 AM Theodis Sato wrote: Reason for CRM: PT coughing blood- severe back/chest pain

## 2023-10-16 NOTE — Telephone Encounter (Addendum)
Copied from CRM [id number]. Topic: Clinical - Red Word Triage >> Oct 16, 2023  8:25 AM Theodis Sato wrote: Reason for CRM: Coughing up blood- back/ chest pain   Chief Complaint: Coughing fits, blood in sputum, trouble breathing, chest pain Symptoms: Chest and back pain 10/10, dark brown/dark yellow/bloody mucousy sputum, voice "coming and going," SOB Frequency: Continual; intermittent coughing fits Pertinent Negatives: Patient denies [pt refusing to answer further assessment questions] Disposition: [x] ED /[] Urgent Care (no appt availability in office) / [] Appointment(In office/virtual)/ []  [facility]/ [] Home Care/ [] Refused Recommended Disposition /[] [place] [facility]/ []  Follow-up with PCP Additional Notes: Pt reporting with hoarse voice that she started having cough on Monday, started having "not bad" chest pain on Tuesday, then symptoms came "all together" with chest pain and pain to "back and around" that is 10/10, having coughing fits (nurse can confirm, heard on phone), trouble breathing, and having "dark brown" or "dark yellow" mucus with "blood in it" come up when she coughs. Pt reporting that there is just "a little" blood, "not tons of blood." Pt reporting that she coughed up mucus with "little blood." Pt reporting she's needing to use her "nebulizer, all the inhalers," and that she did these right before calling and "will do them again when I hang up." Pt is difficult to hear, partially because voice "coming and going." Advised pt be seen in hospital, pt refusing to go. Pt reporting that "this has happened before" and she was seen for "this bronchitis" through a virtual visit before. Pt reporting that she has an appt that was made for these symptoms for later this week that she wants to make sooner and virtual. Pt is refusing to answer further assessment questions and asking to speak to the PCP front desk, "you don't know about me, don't even want to tell you" more about symptoms.  Connected pt to front desk through CAL with warm transfer.  COUGHING UP BLOOD-A-AH 1. ONSET: "When did the cough begin?"      Pt reporting that the cough began on Monday 2. SEVERITY: "How bad is the cough today?" "Did the blood appear after a coughing spell?"      Pt reporting that she is having coughing fits, nurse heard coughing fit on phone call. Pt reporting that she coughed up mucus with "little blood." 3. SPUTUM: "Describe the color of your sputum" (none, dry cough; clear, white, yellow, green)     Pt describing sputum as "dark brown, bloody mucus," and some "dark yellow with blood in it." Pt clarifying "not a lot of blood." 4. HEMOPTYSIS: "How much blood?" (flecks, streaks, tablespoons, etc.)     Pt reporting "not a lot of blood," "a little." 5. DIFFICULTY BREATHING: "Are you having difficulty breathing?" If Yes, ask: "How bad is it?" (e.g., mild, moderate, severe)    - MILD: No SOB at rest, mild SOB with walking, speaks normally in sentences, can lie down, no retractions, pulse < 100.    - MODERATE: SOB at rest, SOB with minimal exertion and prefers to sit, cannot lie down flat, speaks in phrases, mild retractions, audible wheezing, pulse 100-120.    - SEVERE: Very SOB at rest, speaks in single words, struggling to breathe, sitting hunched forward, retractions, pulse > 120      Pt reporting trouble breathing, needing to use her "nebulizer, all the inhalers," and that she did these right before calling and "will do them again when I hang up." Pt is very difficult to hear, partially  because voice "coming and going."  Pt reporting that "this has happened before" and she was seen for "this bronchitis" through a virtual visit before. Pt reporting that she has an appt that was made for these symptoms for later this week that she wants to make sooner and virtual. Advised pt be seen in hospital, pt refusing to go. Pt is refusing to answer further assessment questions and asking to speak to the PCP  front desk, "you don't know about me, don't even want to tell you" more about symptoms.

## 2023-10-16 NOTE — Assessment & Plan Note (Signed)
Restasis is prescribed

## 2023-10-16 NOTE — Patient Instructions (Addendum)
Follow-up as before, call if you need me sooner.  You are treated today for acute sinusitis and bronchitis with wheezing.  Medications which have been sent and are 5-day course of prednisone, 10-day course of Septra, Tessalon Perles twice daily as needed for 10 days, and cough suppressant syrup 1 teaspoon at bedtime.  1 fluconazole tablets is prescribed in the event that you have yeast vaginitis from prolonged antibiotic use.   Restasis is prescribed for dry eyes  Thanks for choosing [facility], we consider it a privelige to serve you.

## 2023-10-17 ENCOUNTER — Ambulatory Visit: Payer: Medicare HMO | Admitting: Family Medicine

## 2023-10-20 ENCOUNTER — Encounter (HOSPITAL_BASED_OUTPATIENT_CLINIC_OR_DEPARTMENT_OTHER): Payer: Medicare HMO | Admitting: Physical Medicine and Rehabilitation

## 2023-10-20 VITALS — BP 111/74 | HR 93 | Ht 66.0 in | Wt 162.0 lb

## 2023-10-20 DIAGNOSIS — Z638 Other specified problems related to primary support group: Secondary | ICD-10-CM | POA: Diagnosis not present

## 2023-10-20 DIAGNOSIS — I7300 Raynaud's syndrome without gangrene: Secondary | ICD-10-CM

## 2023-10-20 DIAGNOSIS — G894 Chronic pain syndrome: Secondary | ICD-10-CM

## 2023-10-20 DIAGNOSIS — L739 Follicular disorder, unspecified: Secondary | ICD-10-CM | POA: Diagnosis not present

## 2023-10-20 DIAGNOSIS — E663 Overweight: Secondary | ICD-10-CM | POA: Diagnosis not present

## 2023-10-20 DIAGNOSIS — M19049 Primary osteoarthritis, unspecified hand: Secondary | ICD-10-CM

## 2023-10-20 DIAGNOSIS — Z760 Encounter for issue of repeat prescription: Secondary | ICD-10-CM | POA: Diagnosis not present

## 2023-10-20 DIAGNOSIS — W19XXXA Unspecified fall, initial encounter: Secondary | ICD-10-CM | POA: Diagnosis not present

## 2023-10-20 DIAGNOSIS — M7918 Myalgia, other site: Secondary | ICD-10-CM | POA: Diagnosis not present

## 2023-10-20 DIAGNOSIS — F32A Depression, unspecified: Secondary | ICD-10-CM | POA: Diagnosis not present

## 2023-10-20 DIAGNOSIS — Z6827 Body mass index (BMI) 27.0-27.9, adult: Secondary | ICD-10-CM | POA: Diagnosis not present

## 2023-10-20 DIAGNOSIS — G4700 Insomnia, unspecified: Secondary | ICD-10-CM | POA: Diagnosis not present

## 2023-10-20 DIAGNOSIS — M961 Postlaminectomy syndrome, not elsewhere classified: Secondary | ICD-10-CM | POA: Diagnosis not present

## 2023-10-20 MED ORDER — OXYCODONE HCL 5 MG PO TABS: 10.0000 mg | ORAL_TABLET | Freq: Two times a day (BID) | ORAL | 0 refills | Status: DC

## 2023-10-20 NOTE — Progress Notes (Signed)
B   Subjective:    Patient ID: Jane English, female    DOB: September 19, 1971, 52 y.o.   MRN: [id number]  HPI   Mrs. Weist is a 52 year old woman who presents for f/u of failed back syndrome and history of obesity, now overweight.  1) Overweight, history of obesity Continues to lose weight -she cut out junk food -tries to stay away from fried foods BMI 27.70 today, weight 171lbs  2) Failed back syndrome: -needs refill of her oxycodone, needs this to be sent to a different pharmacy as her current does not have this in stock -has been using turmeric and collagen -needs refill of oxycodone and amitriptyline today -had stimulator removed and site has been hurting since then -stents removed as well.  -going back to see him on the 13th -no signs of infection -staples removed.  -returning to work today -needs refill off oxycodone  3) insomnia -continues to sleep poorly at night  4) depression -has been having some depression but pushes through  5) Pustules on right side of head -saw her PCP yesterday and was prescribed doxycycline -it dried her hair and it made her head itch -she was told it is caused by sweating -she asks why she got folliculiits  6) Work stress: -discussed that she has a very difficult charge who has been hurting herself  7) Hand arthritis: -she asks about a new hand arthritis cream  8) Raynaud's syndrome: -asks about Flossie Dibble -asks about compounding cream  9) s/p fall: -fell due to leg giving out   Pain Inventory Average Pain 10 Pain Right Now 8 My pain is intermittent, constant, sharp, burning, dull, stabbing, tingling, and aching  In the last 24 hours, has pain interfered with the following? General activity 8 Relation with others 8 Enjoyment of life 8 What TIME of day is your pain at its worst? morning , evening, and night Sleep (in general) Poor  Pain is worse with: walking, bending, sitting, inactivity, standing, and some activites Pain  improves with: rest and medication Relief from Meds: good  Family History  Problem Relation Age of Onset   Physical abuse Mother    Alcohol abuse Mother    Cirrhosis Mother    Lung cancer Father    Stomach cancer Father    Esophageal cancer Father    Alcohol abuse Father    Mental illness Father    Diabetes Sister    Hypertension Sister    Bipolar disorder Sister    Schizophrenia Sister    Diabetes Sister    Drug abuse Sister    HIV Sister    Pneumonia Sister        died as a baby   Alcohol abuse Brother    Hypertension Brother    Kidney disease Brother    Diabetes Brother    Drug abuse Brother    Mental illness Brother    Alcohol abuse Brother    Alcohol abuse Brother    Hypertension Brother    Diabetes Brother    Alcohol abuse Brother    Mental illness Brother        in [place]   Alcohol abuse Brother    Alcohol abuse Brother    Bipolar disorder Brother    Hypertension Brother    Bipolar disorder Brother    Drug abuse Brother    Alcohol abuse Brother    Bipolar disorder Brother    Bipolar disorder Daughter    Bipolar disorder Son  Bipolar disorder Son    ADD / ADHD Neg Hx    Anxiety disorder Neg Hx    Dementia Neg Hx    Depression Neg Hx    OCD Neg Hx    Seizures Neg Hx    Paranoid behavior Neg Hx    Colon cancer Neg Hx    Rectal cancer Neg Hx    Social History   Socioeconomic History   Marital status: Married    Spouse name: Molly Maduro   Number of children: 4   Years of education: 12   Highest education level: GED or equivalent  Occupational History   Occupation: disabled  Tobacco Use   Smoking status: Never    Passive exposure: Never   Smokeless tobacco: Never  Vaping Use   Vaping status: Never Used  Substance and Sexual Activity   Alcohol use: No   Drug use: No   Sexual activity: Not Currently    Partners: Male    Birth control/protection: Surgical    Comment: tubal  Other Topics Concern   Not on file  Social History Narrative   Not on  file   Social Drivers of Health   Financial Resource Strain: Medium Risk (03/11/2023)   Overall Financial Resource Strain (CARDIA)    Difficulty of Paying Living Expenses: Somewhat hard  Food Insecurity: No Food Insecurity (03/11/2023)   Hunger Vital Sign    Worried About Running Out of Food in the Last Year: Never true    Ran Out of Food in the Last Year: Never true  Transportation Needs: No Transportation Needs (03/11/2023)   PRAPARE - Administrator, Civil Service (Medical): No    Lack of Transportation (Non-Medical): No  Physical Activity: Unknown (03/11/2023)   Exercise Vital Sign    Days of Exercise per Week: Patient declined    Minutes of Exercise per Session: Not on file  Stress: No Stress Concern Present (03/11/2023)   [employer] of Occupational Health - Occupational Stress Questionnaire    Feeling of Stress : Only a little  Social Connections: Socially Integrated (03/11/2023)   Social Connection and Isolation Panel [NHANES]    Frequency of Communication with Friends and Family: More than three times a week    Frequency of Social Gatherings with Friends and Family: Patient declined    Attends Religious Services: More than 4 times per year    Active Member of Clubs or Organizations: Yes    Attends Banker Meetings: 1 to 4 times per year    Marital Status: Married   Past Surgical History:  Procedure Laterality Date   ANTERIOR CERVICAL DECOMP/DISCECTOMY FUSION  07/07/2012   Procedure: ANTERIOR CERVICAL DECOMPRESSION/DISCECTOMY FUSION 2 LEVELS;  Surgeon: Karn Cassis, MD;  Location: [facility];  Service: Neurosurgery;  Laterality: N/A;  Cervical four-five, five - six  Anterior cervical decompression/diskectomy/fusion/plate   APPENDECTOMY  11/04/1984   BOWEL RESECTION N/A 07/29/2013   Procedure: serosal repair;  Surgeon: Ardeth Sportsman, MD;  Location: [facility];  Service: General;  Laterality: N/A;   CARPAL TUNNEL RELEASE Bilateral    COLON SURGERY  N/A    Phreesia 07/29/2020   LAPAROSCOPY N/A 07/29/2013   Procedure: diagnostic laporoscopy;  Surgeon: Ardeth Sportsman, MD;  Location: [facility];  Service: General;  Laterality: N/A;   LAPAROSCOPY N/A 08/16/2013   Procedure: LAPAROSCOPY DIAGNOSTIC/LYSIS OF ADHESIONS;  Surgeon: Ardeth Sportsman, MD;  Location: [facility];  Service: General;  Laterality: N/A;   LAPAROTOMY N/A 08/16/2013  Procedure: EXPLORATORY LAPAROTOMY/SMALL BOWEL RESECTION (JEJUNUM);  Surgeon: Ardeth Sportsman, MD;  Location: [facility];  Service: General;  Laterality: N/A;   LUMBAR SPINE SURGERY  11/04/2008   x 3   LYSIS OF ADHESION  11/04/2001   Dr. Elpidio Anis   LYSIS OF ADHESION N/A 07/29/2013   Procedure: LYSIS OF ADHESION;  Surgeon: Ardeth Sportsman, MD;  Location: [facility];  Service: General;  Laterality: N/A;   OOPHORECTOMY     PARTIAL HYSTERECTOMY  1990s?   [place], [place]   SPINAL CORD STIMULATOR IMPLANT     spine stimulator removal  12/13/2022   SPINE SURGERY N/A    Phreesia 07/29/2020   TRIGGER FINGER RELEASE  11/05/2007   right pinkie finger   TUBAL LIGATION  11/04/1992   Past Surgical History:  Procedure Laterality Date   ANTERIOR CERVICAL DECOMP/DISCECTOMY FUSION  07/07/2012   Procedure: ANTERIOR CERVICAL DECOMPRESSION/DISCECTOMY FUSION 2 LEVELS;  Surgeon: Karn Cassis, MD;  Location: [facility];  Service: Neurosurgery;  Laterality: N/A;  Cervical four-five, five - six  Anterior cervical decompression/diskectomy/fusion/plate   APPENDECTOMY  11/04/1984   BOWEL RESECTION N/A 07/29/2013   Procedure: serosal repair;  Surgeon: Ardeth Sportsman, MD;  Location: [facility];  Service: General;  Laterality: N/A;   CARPAL TUNNEL RELEASE Bilateral    COLON SURGERY N/A    Phreesia 07/29/2020   LAPAROSCOPY N/A 07/29/2013   Procedure: diagnostic laporoscopy;  Surgeon: Ardeth Sportsman, MD;  Location: [facility];  Service: General;  Laterality: N/A;   LAPAROSCOPY N/A 08/16/2013   Procedure: LAPAROSCOPY DIAGNOSTIC/LYSIS OF  ADHESIONS;  Surgeon: Ardeth Sportsman, MD;  Location: [facility];  Service: General;  Laterality: N/A;   LAPAROTOMY N/A 08/16/2013   Procedure: EXPLORATORY LAPAROTOMY/SMALL BOWEL RESECTION (JEJUNUM);  Surgeon: Ardeth Sportsman, MD;  Location: [facility];  Service: General;  Laterality: N/A;   LUMBAR SPINE SURGERY  11/04/2008   x 3   LYSIS OF ADHESION  11/04/2001   Dr. Elpidio Anis   LYSIS OF ADHESION N/A 07/29/2013   Procedure: LYSIS OF ADHESION;  Surgeon: Ardeth Sportsman, MD;  Location: [facility];  Service: General;  Laterality: N/A;   OOPHORECTOMY     PARTIAL HYSTERECTOMY  1990s?   [place], [place]   SPINAL CORD STIMULATOR IMPLANT     spine stimulator removal  12/13/2022   SPINE SURGERY N/A    Phreesia 07/29/2020   TRIGGER FINGER RELEASE  11/05/2007   right pinkie finger   TUBAL LIGATION  11/04/1992   Past Medical History:  Diagnosis Date   Anemia    Asthma    Asthma flare 04/09/2013   Back pain    Bronchitis    Chronic abdominal pain    Chronic constipation    Constipation due to opioid therapy    Depression    Depression, major, single episode, severe (HCC) 10/03/2018   PHQ 9 score of 15   Diabetes mellitus without complication (HCC)    Diabetes mellitus, type II (HCC)    DVT (deep venous thrombosis) (HCC) 2010   GERD (gastroesophageal reflux disease)    Heart murmur    no cardiologist   Helicobacter pylori gastritis 06/11/2013   Colonoscopy Dr. Rhea Belton   Hypertension    IBS (irritable bowel syndrome)    Migraine headache    Neuropathy    Obesity    Obsessive-compulsive disorder    PSYCHOTIC D/O W/HALLUCINATIONS CONDS CLASS ELSW 03/04/2010   Qualifier: Diagnosis of  By: Lodema Hong MD, Claris Che  PTSD (post-traumatic stress disorder)    SBO (small bowel obstruction) (HCC) 08/09/2013   Seasonal allergies 12/10/2012   Seizures (HCC)    Shortness of breath    BP 111/74   Pulse 93   Ht 5\' 6"  (1.676 m)   Wt 162 lb (73.5 kg)   SpO2 96%   BMI 26.15 kg/m   Opioid Risk Score:   Fall  Risk Score:  `1  Depression screen [facility] 2/9     10/20/2023    9:05 AM 09/22/2023    2:43 PM 08/25/2023    3:03 PM 07/01/2023    8:36 AM 06/27/2023   10:00 AM 05/07/2023    9:01 AM 04/22/2023    3:00 PM  Depression screen PHQ 2/9  Decreased Interest 0 0 0 0 0 1 2  Down, Depressed, Hopeless 0 0 0 0 0 1 2  PHQ - 2 Score 0 0 0 0 0 2 4  Altered sleeping       0  Tired, decreased energy       0  Change in appetite       0  Feeling bad or failure about yourself        2  Trouble concentrating       0  Moving slowly or fidgety/restless       0  Suicidal thoughts       0  PHQ-9 Score       6  Difficult doing work/chores       Not difficult at all     Review of Systems  Constitutional: Negative.   HENT: Negative.    Eyes: Negative.   Respiratory: Negative.    Cardiovascular: Negative.   Gastrointestinal: Negative.   Endocrine: Negative.   Genitourinary: Negative.   Musculoskeletal:  Positive for back pain.       Bilateral shoulder pain Right knee pain Bilateral feet  Skin: Negative.   Allergic/Immunologic: Negative.   Neurological: Negative.   Hematological: Negative.   Psychiatric/Behavioral: Negative.    All other systems reviewed and are negative.      Objective:   Physical Exam PRIOR EXAM Gen: no distress, normal appearing, BMI 26.15, weight 172 lbs HEENT: oral mucosa pink and moist, NCAT Cardio: Reg rate Chest: normal effort, normal rate of breathing Abd: soft, non-distended Ext: no edema Psych: pleasant, normal affect Skin: intact Neuro: Alert and oriented x3, ambulating normally MSK: tight cervical myofasica, TTP lower back bilaterally, exam stable 12/16    Assessment & Plan:   1) Failed back syndrome -refilled oxycodone and amitriptyline, alerted Riley Lam that patient's pharmacy does not have oxycodone in stock and requested that she send medication to hospital pharmacy -UDS ordered previously -refilled oxycodone -encouraged follow-up with surgeon regarding  pain after spinal cord stimulator removal -Provided with a pain relief journal and discussed that it contains foods and lifestyle tips to naturally help to improve pain. Discussed that these lifestyle strategies are also very good for health unlike some medications which can have negative side effects. Discussed that the act of keeping a journal can be therapeutic and helpful to realize patterns what helps to trigger and alleviate pain.  . -Discussed current symptoms of pain and history of pain.  -Discussed benefits of exercise in reducing pain. -Discussed following foods that may reduce pain: 1) Ginger (especially studied for arthritis)- reduce leukotriene production to decrease inflammation 2) Blueberries- high in phytonutrients that decrease inflammation 3) Salmon- marine omega-3s reduce joint swelling and pain 4) Pumpkin seeds- reduce  inflammation 5) dark chocolate- reduces inflammation 6) turmeric- reduces inflammation 7) tart cherries - reduce pain and stiffness 8) extra virgin olive oil - its compound olecanthal helps to block prostaglandins  9) chili peppers- can be eaten or applied topically via capsaicin 10) mint- helpful for headache, muscle aches, joint pain, and itching 11) garlic- reduces inflammation  Link to further information on diet for chronic pain: http://www.bray.com/   Turmeric to reduce inflammation--can be used in cooking or taken as a supplement.  Benefits of turmeric:  -Highly anti-inflammatory  -Increases antioxidants  -Improves memory, attention, brain disease  -Lowers risk of heart disease  -May help prevent cancer  -Decreases pain  -Alleviates depression  -Delays aging and decreases risk of chronic disease  -Consume with black pepper to increase absorption   2) Overweight, history of obesity -discussed slight weight increase -provided dietary education -continue  Mounjaro -discussed her current diet  3) Insomnia: -continue amitriptyline -Try to go outside near sunrise -Get exercise during the day.  -Turn off all devices an hour before bedtime.  -Teas that can benefit: chamomile, valerian root, Brahmi (Bacopa) -Can consider over the counter melatonin, magnesium, and/or L-theanine. Melatonin is an anti-oxidant with multiple health benefits. Magnesium is involved in greater than 300 enzymatic reactions in the body and most of [place] are deficient as our soil is often depleted. There are 7 different types of magnesium- Bioptemizer's is a supplement with all 7 types, and each has unique benefits. Magnesium can also help with constipation and anxiety.  -Pistachios naturally increase the production of melatonin -[employer]zy [employer] bamboo bed sheets are free from toxic chemicals.  -Tart cherry juice or a tart cherry supplement can improve sleep and soreness post-workout  4) Depression: -discussed plan to move in with her son at [facility]. -discussed her resilience -discussed that her husband wants to come back and she is ok with this -discussed that she does not agree with leaving someone just because you have a disgreement  5)  Folliculitis: -previously took doxyclycine, discussed how this can negatively affect the gut microbiome -recommended biotin -recommended rosemary -continue peppermint and tea tree oil  6) Cervical myofascial pain syndrome: -discussed trigger point injections but she defers  7) Family stress: -discussed that she is continuing with plan to divorce her husband -discussed her daughter's rudeness to her  8) Hand arthritis -discussed new cream she would like to try for this -will call in refill of compounding cream  9) Raynaud's syndrome: -discussed wearing gloves outside  10) s/p fall:  -discussed fall, no injuries sustained

## 2023-10-23 ENCOUNTER — Other Ambulatory Visit: Payer: Self-pay | Admitting: Internal Medicine

## 2023-10-23 ENCOUNTER — Telehealth: Payer: Self-pay | Admitting: Family Medicine

## 2023-10-23 DIAGNOSIS — R051 Acute cough: Secondary | ICD-10-CM

## 2023-10-23 MED ORDER — CYCLOSPORINE 0.05 % OP EMUL: 1.0000 [drp] | Freq: Two times a day (BID) | OPHTHALMIC | 0 refills | Status: AC

## 2023-10-23 MED ORDER — PREDNISONE 20 MG PO TABS: ORAL_TABLET | ORAL | 0 refills | Status: DC

## 2023-10-23 NOTE — Telephone Encounter (Signed)
States she is still having the cough with thick mucus, is almost out of tessalon perles and is asking for something else to be sent to help with the coughing. Also she said you never sent in the eye drops you told her about- I seen Restasis was printed on 12/12 so I resent that to her pharmacy electronically.

## 2023-10-23 NOTE — Telephone Encounter (Signed)
I spoke with pt , prednisone 20 mg taper prescribed, also cXR ordered, I recommend increasing nebs  to 3 times daily

## 2023-10-30 DIAGNOSIS — M961 Postlaminectomy syndrome, not elsewhere classified: Secondary | ICD-10-CM | POA: Diagnosis not present

## 2023-11-04 ENCOUNTER — Ambulatory Visit: Payer: Medicare HMO | Admitting: Family Medicine

## 2023-11-07 ENCOUNTER — Other Ambulatory Visit: Payer: Self-pay | Admitting: Family Medicine

## 2023-11-11 ENCOUNTER — Ambulatory Visit (INDEPENDENT_AMBULATORY_CARE_PROVIDER_SITE_OTHER): Payer: Medicare HMO

## 2023-11-11 VITALS — Ht 66.0 in | Wt 164.0 lb

## 2023-11-11 DIAGNOSIS — E1169 Type 2 diabetes mellitus with other specified complication: Secondary | ICD-10-CM

## 2023-11-11 DIAGNOSIS — Z0001 Encounter for general adult medical examination with abnormal findings: Secondary | ICD-10-CM

## 2023-11-11 DIAGNOSIS — Z0000 Encounter for general adult medical examination without abnormal findings: Secondary | ICD-10-CM

## 2023-11-11 NOTE — Patient Instructions (Signed)
 Jane English , Thank you for taking time to come for your Medicare Wellness Visit. I appreciate your ongoing commitment to your health goals. Please review the following plan we discussed and let me know if I can assist you in the future.   Referrals/Orders/Follow-Ups/Clinician Recommendations:  Next Medicare Annual Wellness Visit: November 15, 2024 at 8:00 am virtual visit  I will send a message regarding a handicap seat that attaches to your current toilet. The office will be in touch.   This is a list of the screening recommended for you and due dates:  Health Maintenance  Topic Date Due   Complete foot exam   03/22/2023   Yearly kidney health urinalysis for diabetes  09/21/2023   COVID-19 Vaccine (5 - 2024-25 season) 10/28/2023   Hemoglobin A1C  01/01/2024   Mammogram  04/07/2024   Yearly kidney function blood test for diabetes  06/30/2024   Eye exam for diabetics  07/09/2024   Medicare Annual Wellness Visit  11/10/2024   Colon Cancer Screening  10/12/2026   Pap with HPV screening  06/30/2028   DTaP/Tdap/Td vaccine (4 - Td or Tdap) 09/07/2032   Flu Shot  Completed   Hepatitis C Screening  Completed   HIV Screening  Completed   Zoster (Shingles) Vaccine  Completed   HPV Vaccine  Aged Out    Advanced directives: (Declined) Advance directive discussed with you today. Even though you declined this today, please call our office should you change your mind, and we can give you the proper paperwork for you to fill out.  Next Medicare Annual Wellness Visit scheduled for next year: Yes  Preventive Care 55-3 Years Old, Female Preventive care refers to lifestyle choices and visits with your health care provider that can promote health and wellness. Preventive care visits are also called wellness exams. What can I expect for my preventive care visit? Counseling Your health care provider may ask you questions about your: Medical history, including: Past medical problems. Family medical  history. Pregnancy history. Current health, including: Menstrual cycle. Method of birth control. Emotional well-being. Home life and relationship well-being. Sexual activity and sexual health. Lifestyle, including: Alcohol, nicotine or tobacco, and drug use. Access to firearms. Diet, exercise, and sleep habits. Work and work astronomer. Sunscreen use. Safety issues such as seatbelt and bike helmet use. Physical exam Your health care provider will check your: Height and weight. These may be used to calculate your BMI (body mass index). BMI is a measurement that tells if you are at a healthy weight. Waist circumference. This measures the distance around your waistline. This measurement also tells if you are at a healthy weight and may help predict your risk of certain diseases, such as type 2 diabetes and high blood pressure. Heart rate and blood pressure. Body temperature. Skin for abnormal spots. What immunizations do I need?  Vaccines are usually given at various ages, according to a schedule. Your health care provider will recommend vaccines for you based on your age, medical history, and lifestyle or other factors, such as travel or where you work. What tests do I need? Screening Your health care provider may recommend screening tests for certain conditions. This may include: Lipid and cholesterol levels. Diabetes screening. This is done by checking your blood sugar (glucose) after you have not eaten for a while (fasting). Pelvic exam and Pap test. Hepatitis B test. Hepatitis C test. HIV (human immunodeficiency virus) test. STI (sexually transmitted infection) testing, if you are at risk. Lung cancer screening.  Colorectal cancer screening. Mammogram. Talk with your health care provider about when you should start having regular mammograms. This may depend on whether you have a family history of breast cancer. BRCA-related cancer screening. This may be done if you have a  family history of breast, ovarian, tubal, or peritoneal cancers. Bone density scan. This is done to screen for osteoporosis. Talk with your health care provider about your test results, treatment options, and if necessary, the need for more tests. Follow these instructions at home: Eating and drinking  Eat a diet that includes fresh fruits and vegetables, whole grains, lean protein, and low-fat dairy products. Take vitamin and mineral supplements as recommended by your health care provider. Do not drink alcohol if: Your health care provider tells you not to drink. You are pregnant, may be pregnant, or are planning to become pregnant. If you drink alcohol: Limit how much you have to 0-1 drink a day. Know how much alcohol is in your drink. In the [place] one drink equals one 12 oz bottle of beer (355 mL), one 5 oz glass of wine (148 mL), or one 1 oz glass of hard liquor (44 mL). Lifestyle Brush your teeth every morning and night with fluoride  toothpaste. Floss one time each day. Exercise for at least 30 minutes 5 or more days each week. Do not use any products that contain nicotine or tobacco. These products include cigarettes, chewing tobacco, and vaping devices, such as e-cigarettes. If you need help quitting, ask your health care provider. Do not use drugs. If you are sexually active, practice safe sex. Use a condom or other form of protection to prevent STIs. If you do not wish to become pregnant, use a form of birth control. If you plan to become pregnant, see your health care provider for a prepregnancy visit. Take aspirin only as told by your health care provider. Make sure that you understand how much to take and what form to take. Work with your health care provider to find out whether it is safe and beneficial for you to take aspirin daily. Find healthy ways to manage stress, such as: Meditation, yoga, or listening to music. Journaling. Talking to a trusted person. Spending time with  friends and family. Minimize exposure to UV radiation to reduce your risk of skin cancer. Safety Always wear your seat belt while driving or riding in a vehicle. Do not drive: If you have been drinking alcohol. Do not ride with someone who has been drinking. When you are tired or distracted. While texting. If you have been using any mind-altering substances or drugs. Wear a helmet and other protective equipment during sports activities. If you have firearms in your house, make sure you follow all gun safety procedures. Seek help if you have been physically or sexually abused. What's next? Visit your health care provider once a year for an annual wellness visit. Ask your health care provider how often you should have your eyes and teeth checked. Stay up to date on all vaccines. This information is not intended to replace advice given to you by your health care provider. Make sure you discuss any questions you have with your health care provider. Document Revised: 04/18/2021 Document Reviewed: 04/18/2021 Elsevier Patient Education  2024 [employer]. Understanding Your Risk for Falls Millions of people have serious injuries from falls each year. It is important to understand your risk of falling. Talk with your health care provider about your risk and what you can do to lower it.  If you do have a serious fall, make sure to tell your provider. Falling once raises your risk of falling again. How can falls affect me? Serious injuries from falls are common. These include: Broken bones, such as hip fractures. Head injuries, such as traumatic brain injuries (TBI) or concussions. A fear of falling can cause you to avoid activities and stay at home. This can make your muscles weaker and raise your risk for a fall. What can increase my risk? There are a number of risk factors that increase your risk for falling. The more risk factors you have, the higher your risk of falling. Serious injuries from  a fall happen most often to people who are older than 55 years old. Teenagers and young adults ages 64-29 are also at higher risk. Common risk factors include: Weakness in the lower body. Being generally weak or confused due to long-term (chronic) illness. Dizziness or balance problems. Poor vision. Medicines that cause dizziness or drowsiness. These may include: Medicines for your blood pressure, heart, anxiety, insomnia, or swelling (edema). Pain medicines. Muscle relaxants. Other risk factors include: Drinking alcohol. Having had a fall in the past. Having foot pain or wearing improper footwear. Working at a dangerous job. Having any of the following in your home: Tripping hazards, such as floor clutter or loose rugs. Poor lighting. Pets. Having dementia or memory loss. What actions can I take to lower my risk of falling?     Physical activity Stay physically fit. Do strength and balance exercises. Consider taking a regular class to build strength and balance. Yoga and tai chi are good options. Vision Have your eyes checked every year and your prescription for glasses or contacts updated as needed. Shoes and walking aids Wear non-skid shoes. Wear shoes that have rubber soles and low heels. Do not wear high heels. Do not walk around the house in socks or slippers. Use a cane or walker as told by your provider. Home safety Attach secure railings on both sides of your stairs. Install grab bars for your bathtub, shower, and toilet. Use a non-skid mat in your bathtub or shower. Attach bath mats securely with double-sided, non-slip rug tape. Use good lighting in all rooms. Keep a flashlight near your bed. Make sure there is a clear path from your bed to the bathroom. Use night-lights. Do not use throw rugs. Make sure all carpeting is taped or tacked down securely. Remove all clutter from walkways and stairways, including extension cords. Repair uneven or broken steps and  floors. Avoid walking on icy or slippery surfaces. Walk on the grass instead of on icy or slick sidewalks. Use ice melter to get rid of ice on walkways in the winter. Use a cordless phone. Questions to ask your health care provider Can you help me check my risk for a fall? Do any of my medicines make me more likely to fall? Should I take a vitamin D  supplement? What exercises can I do to improve my strength and balance? Should I make an appointment to have my vision checked? Do I need a bone density test to check for weak bones (osteoporosis)? Would it help to use a cane or a walker? Where to find more information Centers for Disease Control and Prevention, STEAD[employer]: tonerpromos.no Community-Based Fall Prevention Programs: tonerpromos.no [employer] on Aging: baseringtones.pl Contact a health care provider if: You fall at home. You are afraid of falling at home. You feel weak, drowsy, or dizzy. This information is not intended to replace  advice given to you by your health care provider. Make sure you discuss any questions you have with your health care provider. Document Revised: 06/24/2022 Document Reviewed: 06/24/2022 Elsevier Patient Education  2024 [employer] Managing Pain Without Opioids Opioids are strong medicines used to treat moderate to severe pain. For some people, especially those who have long-term (chronic) pain, opioids may not be the best choice for pain management due to: Side effects like nausea, constipation, and sleepiness. The risk of addiction (opioid use disorder). The longer you take opioids, the greater your risk of addiction. Pain that lasts for more than 3 months is called chronic pain. Managing chronic pain usually requires more than one approach and is often provided by a team of health care providers working together (multidisciplinary approach). Pain management may be done at a pain management center or pain clinic. How to manage pain without the use of opioids Use  non-opioid medicines Non-opioid medicines for pain may include: Over-the-counter or prescription non-steroidal anti-inflammatory drugs (NSAIDs). These may be the first medicines used for pain. They work well for muscle and bone pain, and they reduce swelling. Acetaminophen . This over-the-counter medicine may work well for milder pain but not swelling. Antidepressants. These may be used to treat chronic pain. A certain type of antidepressant (tricyclics) is often used. These medicines are given in lower doses for pain than when used for depression. Anticonvulsants. These are usually used to treat seizures but may also reduce nerve (neuropathic) pain. Muscle relaxants. These relieve pain caused by sudden muscle tightening (spasms). You may also use a pain medicine that is applied to the skin as a patch, cream, or gel (topical analgesic), such as a numbing medicine. These may cause fewer side effects than medicines taken by mouth. Do certain therapies as directed Some therapies can help with pain management. They include: Physical therapy. You will do exercises to gain strength and flexibility. A physical therapist may teach you exercises to move and stretch parts of your body that are weak, stiff, or painful. You can learn these exercises at physical therapy visits and practice them at home. Physical therapy may also involve: Massage. Heat wraps or applying heat or cold to affected areas. Electrical signals that interrupt pain signals (transcutaneous electrical nerve stimulation, TENS). Weak lasers that reduce pain and swelling (low-level laser therapy). Signals from your body that help you learn to regulate pain (biofeedback). Occupational therapy. This helps you to learn ways to function at home and work with less pain. Recreational therapy. This involves trying new activities or hobbies, such as a physical activity or drawing. Mental health therapy, including: Cognitive behavioral therapy (CBT).  This helps you learn coping skills for dealing with pain. Acceptance and commitment therapy (ACT) to change the way you think and react to pain. Relaxation therapies, including muscle relaxation exercises and mindfulness-based stress reduction. Pain management counseling. This may be individual, family, or group counseling.  Receive medical treatments Medical treatments for pain management include: Nerve block injections. These may include a pain blocker and anti-inflammatory medicines. You may have injections: Near the spine to relieve chronic back or neck pain. Into joints to relieve back or joint pain. Into nerve areas that supply a painful area to relieve body pain. Into muscles (trigger point injections) to relieve some painful muscle conditions. A medical device placed near your spine to help block pain signals and relieve nerve pain or chronic back pain (spinal cord stimulation device). Acupuncture. Follow these instructions at home Medicines Take over-the-counter and prescription  medicines only as told by your health care provider. If you are taking pain medicine, ask your health care providers about possible side effects to watch out for. Do not drive or use heavy machinery while taking prescription opioid pain medicine. Lifestyle  Do not use drugs or alcohol to reduce pain. If you drink alcohol, limit how much you have to: 0-1 drink a day for women who are not pregnant. 0-2 drinks a day for men. Know how much alcohol is in a drink. In the [place] one drink equals one 12 oz bottle of beer (355 mL), one 5 oz glass of wine (148 mL), or one 1 oz glass of hard liquor (44 mL). Do not use any products that contain nicotine or tobacco. These products include cigarettes, chewing tobacco, and vaping devices, such as e-cigarettes. If you need help quitting, ask your health care provider. Eat a healthy diet and maintain a healthy weight. Poor diet and excess weight may make pain worse. Eat  foods that are high in fiber. These include fresh fruits and vegetables, whole grains, and beans. Limit foods that are high in fat and processed sugars, such as fried and sweet foods. Exercise regularly. Exercise lowers stress and may help relieve pain. Ask your health care provider what activities and exercises are safe for you. If your health care provider approves, join an exercise class that combines movement and stress reduction. Examples include yoga and tai chi. Get enough sleep. Lack of sleep may make pain worse. Lower stress as much as possible. Practice stress reduction techniques as told by your therapist. General instructions Work with all your pain management providers to find the treatments that work best for you. You are an important member of your pain management team. There are many things you can do to reduce pain on your own. Consider joining an online or in-person support group for people who have chronic pain. Keep all follow-up visits. This is important. Where to find more information You can find more information about managing pain without opioids from: American Academy of Pain Medicine: painmed.org Institute for Chronic Pain: instituteforchronicpain.org American Chronic Pain Association: theacpa.org Contact a health care provider if: You have side effects from pain medicine. Your pain gets worse or does not get better with treatments or home therapy. You are struggling with anxiety or depression. Summary Many types of pain can be managed without opioids. Chronic pain may respond better to pain management without opioids. Pain is best managed when you and a team of health care providers work together. Pain management without opioids may include non-opioid medicines, medical treatments, physical therapy, mental health therapy, and lifestyle changes. Tell your health care providers if your pain gets worse or is not being managed well enough. This information is not  intended to replace advice given to you by your health care provider. Make sure you discuss any questions you have with your health care provider. Document Revised: 01/31/2021 Document Reviewed: 01/31/2021 Elsevier Patient Education  2024 [employer].

## 2023-11-11 NOTE — Progress Notes (Signed)
 Please attest and cosign this visit due to patients primary care provider not being in the office at the time the visit was completed.   Because this visit was a virtual/telehealth visit,  certain criteria was not obtained, such a blood pressure, CBG if applicable, and timed get up and go. Any medications not marked as taking were not mentioned during the medication reconciliation part of the visit. Any vitals not documented were not able to be obtained due to this being a telehealth visit or patient was unable to self-report a recent blood pressure reading due to a lack of equipment at home via telehealth. Vitals that have been documented are verbally provided by the patient.   Subjective:   Jane English is a 53 y.o. female who presents for Medicare Annual (Subsequent) preventive examination.  Visit Complete: Virtual I connected with  Jane English on 11/11/23 by a video and audio enabled telemedicine application and verified that I am speaking with the correct person using two identifiers.  Patient Location: Home  Provider Location: Home Office  I discussed the limitations of evaluation and management by telemedicine. The patient expressed understanding and agreed to proceed.  Vital Signs: Because this visit was a virtual/telehealth visit, some criteria may be missing or patient reported. Any vitals not documented were not able to be obtained and vitals that have been documented are patient reported.  Patient Medicare AWV questionnaire was completed by the patient on na; I have confirmed that all information answered by patient is correct and no changes since this date.  Cardiac Risk Factors include: advanced age (>43men, >24 women);diabetes mellitus;dyslipidemia;hypertension;sedentary lifestyle     Objective:    Today's Vitals   11/11/23 0753 11/11/23 0755  Weight: 164 lb (74.4 kg)   Height: 5' 6 (1.676 m)   PainSc:  8    Body mass index is 26.47 kg/m.     11/11/2023    7:52  AM 06/05/2022    7:55 PM 11/13/2021    4:48 PM 11/08/2021    8:14 AM 11/01/2020    9:59 AM 01/29/2020    9:39 PM 11/15/2019    3:16 PM  Advanced Directives  Does Patient Have a Medical Advance Directive? No No No No No No No  Would patient like information on creating a medical advance directive? No - Patient declined No - Patient declined Yes (MAU/Ambulatory/Procedural Areas - Information given) No - Patient declined No - Patient declined No - Patient declined No - Patient declined    Current Medications (verified) Outpatient Encounter Medications as of 11/11/2023  Medication Sig   ACCU-CHEK GUIDE test strip USE AS INSTRUCTED ONCE DAILY DX E11.9   albuterol  (PROVENTIL ) (2.5 MG/3ML) 0.083% nebulizer solution Take 3 mLs (2.5 mg total) by nebulization every 6 (six) hours as needed for wheezing or shortness of breath.   albuterol  (VENTOLIN  HFA) 108 (90 Base) MCG/ACT inhaler TAKE 2 PUFFS BY MOUTH EVERY 6 HOURS AS NEEDED FOR WHEEZE OR SHORTNESS OF BREATH   amitriptyline  (ELAVIL ) 10 MG tablet TAKE 1 TABLET BY MOUTH EVERYDAY AT BEDTIME   beclomethasone (QVAR  REDIHALER) 40 MCG/ACT inhaler Inhale 2 puffs into the lungs 2 (two) times daily.   benzonatate  (TESSALON ) 100 MG capsule Take 1 capsule (100 mg total) by mouth 2 (two) times daily as needed for cough.   benzoyl peroxide  5 % external liquid Apply topically 2 (two) times daily.   blood glucose meter kit and supplies Dispense based on patient and insurance preference. Once daily testing DX  E11.9   BREO ELLIPTA  100-25 MCG/ACT AEPB INHALE 1 PUFF INTO THE LUNGS TWICE A DAY   cloNIDine  (CATAPRES ) 0.1 MG tablet TAKE 1 TABLET BY MOUTH EVERYDAY AT BEDTIME   clotrimazole -betamethasone  (LOTRISONE ) cream APPLY TWICE DAILY FOR 5 DAYS TO AFFECTED AREA(S) THEN AS NEEDED   cromolyn  (OPTICROM ) 4 % ophthalmic solution Place 1 drop into both eyes 4 (four) times daily.   cyclobenzaprine  (FLEXERIL ) 5 MG tablet Take 1 tablet (5 mg total) by mouth 3 (three) times daily as  needed for muscle spasms.   cycloSPORINE  (RESTASIS ) 0.05 % ophthalmic emulsion Place 1 drop into both eyes 2 (two) times daily.   diclofenac  Sodium (VOLTAREN ) 1 % GEL APPLY 2 GRAMS TO AFFECTED AREA 4 TIMES A DAY   EPINEPHrine  0.3 mg/0.3 mL IJ SOAJ injection as needed.   ergocalciferol  (VITAMIN D2) 1.25 MG (50000 UT) capsule Take 1 capsule (50,000 Units total) by mouth once a week. One capsule once weekly   Fluocinolone  Acetonide Body 0.01 % OIL Apply 1 Application topically every other day.   fluticasone  (FLONASE ) 50 MCG/ACT nasal spray PLACE 1-2 SPRAYS INTO BOTH NOSTRILS DAILY.   hydrOXYzine  (VISTARIL ) 25 MG capsule TAKE 1 CAPSULE BY MOUTH THREE TIMES A DAY   ipratropium (ATROVENT ) 0.03 % nasal spray Place 2 sprays into both nostrils every 12 (twelve) hours.   Lancets 30G MISC Once daily testing dx e11.9   lansoprazole  (PREVACID ) 30 MG capsule Take 1 capsule (30 mg total) by mouth daily.   lidocaine  (LIDODERM ) 5 % Place 1 patch onto the skin daily. Remove & Discard patch within 12 hours or as directed by MD   lidocaine  (XYLOCAINE ) 2 % jelly    linaclotide  (LINZESS ) 290 MCG CAPS capsule TAKE 1 CAPSULE (290 MCG TOTAL) BY MOUTH DAILY BEFORE BREAKFAST.   loratadine  (CLARITIN ) 10 MG tablet TAKE 1 TABLET BY MOUTH EVERY DAY   lubiprostone  (AMITIZA ) 24 MCG capsule TAKE 1 CAPSULE (24 MCG TOTAL) BY MOUTH 2 (TWO) TIMES DAILY WITH A MEAL.   magnesium  oxide (MAG-OX) 400 MG tablet Take 1 tablet (400 mg total) by mouth 2 (two) times daily.   meloxicam  (MOBIC ) 7.5 MG tablet TAKE 1 TABLET BY MOUTH 2 TIMES DAILY AS NEEDED FOR PAIN.   methocarbamol  (ROBAXIN ) 500 MG tablet Take 1 tablet (500 mg total) by mouth 4 (four) times daily.   montelukast  (SINGULAIR ) 10 MG tablet TAKE 1 TABLET BY MOUTH EVERYDAY AT BEDTIME   Na Sulfate-K Sulfate-Mg Sulf 17.5-3.13-1.6 GM/177ML SOLN Take as directed   NARCAN  4 MG/0.1ML LIQD nasal spray kit Place 4 mg into the nose as directed.   NONFORMULARY OR COMPOUNDED ITEM Apply 1-2  pumps, 3-4 times daily as needed.   ondansetron  (ZOFRAN ) 4 MG tablet Take 1 tablet (4 mg total) by mouth every 8 (eight) hours as needed for nausea or vomiting.   oxybutynin  (DITROPAN -XL) 10 MG 24 hr tablet TAKE 1 TABLET BY MOUTH EVERYDAY AT BEDTIME   oxyCODONE  (ROXICODONE ) 5 MG immediate release tablet Take 2 tablets (10 mg total) by mouth 2 (two) times daily.   pantoprazole  (PROTONIX ) 40 MG tablet Take 1 tablet (40 mg total) by mouth daily.   PARoxetine  (PAXIL ) 10 MG tablet TAKE 1 TABLET BY MOUTH EVERY DAY   potassium chloride  20 MEQ/15ML (10%) SOLN Take 15 mLs (20 mEq total) by mouth 2 (two) times daily.   predniSONE  (DELTASONE ) 20 MG tablet Take one tablet by mouth three times daily for 2 days, then twice daily for 2 days , then once daily  for 3 days , then stop   promethazine -dextromethorphan (PROMETHAZINE -DM) 6.25-15 MG/5ML syrup Take one teaspoon by mouth at bedtime, as needed, for excessive cough   rosuvastatin  (CRESTOR ) 5 MG tablet Take 1 tablet (5 mg total) by mouth daily.   Safety Seal Miscellaneous MISC Apply 1 Application topically daily. Medication Name: AA Gel, For first week use Every other day, If tolerated Well, increase to daily use   Safety Seal Miscellaneous MISC 1 Application by Does not apply route in the morning. MEDICATION NAME: AA gel   solifenacin  (VESICARE ) 5 MG tablet TAKE 1 TABLET (5 MG TOTAL) BY MOUTH DAILY.   tirzepatide  (MOUNJARO ) 2.5 MG/0.5ML Pen INJECT 2.5 MG SUBCUTANEOUSLY WEEKLY   tirzepatide  (MOUNJARO ) 5 MG/0.5ML Pen Inject 5 mg into the skin once a week.   topiramate  (TOPAMAX ) 25 MG tablet Take 1 tablet (25 mg total) by mouth 2 (two) times daily.   trolamine salicylate (ASPERCREME) 10 % cream Apply 1 application topically as needed for muscle pain.    sulfamethoxazole -trimethoprim  (BACTRIM  DS) 800-160 MG tablet Take 1 tablet by mouth 2 (two) times daily. (Patient not taking: Reported on 11/11/2023)   Facility-Administered Encounter Medications as of 11/11/2023   Medication   betamethasone  acetate-betamethasone  sodium phosphate  (CELESTONE ) injection 6 mg    Allergies (verified) Neurontin [gabapentin], Penicillins, Pregabalin, Tramadol , and Latex   History: Past Medical History:  Diagnosis Date   Anemia    Asthma    Asthma flare 04/09/2013   Back pain    Bronchitis    Chronic abdominal pain    Chronic constipation    Constipation due to opioid therapy    Depression    Depression, major, single episode, severe (HCC) 10/03/2018   PHQ 9 score of 15   Diabetes mellitus without complication (HCC)    Diabetes mellitus, type II (HCC)    DVT (deep venous thrombosis) (HCC) 2010   GERD (gastroesophageal reflux disease)    Heart murmur    no cardiologist   Helicobacter pylori gastritis 06/11/2013   Colonoscopy Dr. Albertus   Hypertension    IBS (irritable bowel syndrome)    Migraine headache    Neuropathy    Obesity    Obsessive-compulsive disorder    PSYCHOTIC D/O W/HALLUCINATIONS CONDS CLASS ELSW 03/04/2010   Qualifier: Diagnosis of  By: Antonetta MD, Margaret     PTSD (post-traumatic stress disorder)    SBO (small bowel obstruction) (HCC) 08/09/2013   Seasonal allergies 12/10/2012   Seizures (HCC)    Shortness of breath    Past Surgical History:  Procedure Laterality Date   ANTERIOR CERVICAL DECOMP/DISCECTOMY FUSION  07/07/2012   Procedure: ANTERIOR CERVICAL DECOMPRESSION/DISCECTOMY FUSION 2 LEVELS;  Surgeon: Catalina CHRISTELLA Stains, MD;  Location: [facility];  Service: Neurosurgery;  Laterality: N/A;  Cervical four-five, five - six  Anterior cervical decompression/diskectomy/fusion/plate   APPENDECTOMY  11/04/1984   BOWEL RESECTION N/A 07/29/2013   Procedure: serosal repair;  Surgeon: Elspeth KYM Schultze, MD;  Location: [facility];  Service: General;  Laterality: N/A;   CARPAL TUNNEL RELEASE Bilateral    COLON SURGERY N/A    [employer] 07/29/2020   LAPAROSCOPY N/A 07/29/2013   Procedure: diagnostic laporoscopy;  Surgeon: Elspeth KYM Schultze, MD;  Location: [facility];  Service: General;  Laterality: N/A;   LAPAROSCOPY N/A 08/16/2013   Procedure: LAPAROSCOPY DIAGNOSTIC/LYSIS OF ADHESIONS;  Surgeon: Elspeth KYM Schultze, MD;  Location: [facility];  Service: General;  Laterality: N/A;   LAPAROTOMY N/A 08/16/2013   Procedure: EXPLORATORY LAPAROTOMY/SMALL BOWEL RESECTION (JEJUNUM);  Surgeon: Elspeth KYM Schultze, MD;  Location: [facility];  Service: General;  Laterality: N/A;   LUMBAR SPINE SURGERY  11/04/2008   x 3   LYSIS OF ADHESION  11/04/2001   Dr. Keven Sharps   LYSIS OF ADHESION N/A 07/29/2013   Procedure: LYSIS OF ADHESION;  Surgeon: Elspeth KYM Schultze, MD;  Location: [facility];  Service: General;  Laterality: N/A;   OOPHORECTOMY     PARTIAL HYSTERECTOMY  1990s?   [place], [place]   SPINAL CORD STIMULATOR IMPLANT     spine stimulator removal  12/13/2022   SPINE SURGERY N/A    [employer] 07/29/2020   TRIGGER FINGER RELEASE  11/05/2007   right pinkie finger   TUBAL LIGATION  11/04/1992   Family History  Problem Relation Age of Onset   Physical abuse Mother    Alcohol abuse Mother    Cirrhosis Mother    Lung cancer Father    Stomach cancer Father    Esophageal cancer Father    Alcohol abuse Father    Mental illness Father    Diabetes Sister    Hypertension Sister    Bipolar disorder Sister    Schizophrenia Sister    Diabetes Sister    Drug abuse Sister    HIV Sister    Pneumonia Sister        died as a baby   Alcohol abuse Brother    Hypertension Brother    Kidney disease Brother    Diabetes Brother    Drug abuse Brother    Mental illness Brother    Alcohol abuse Brother    Alcohol abuse Brother    Hypertension Brother    Diabetes Brother    Alcohol abuse Brother    Mental illness Brother        in [place]   Alcohol abuse Brother    Alcohol abuse Brother    Bipolar disorder Brother    Hypertension Brother    Bipolar disorder Brother    Drug abuse Brother    Alcohol abuse Brother    Bipolar disorder Brother    Bipolar disorder Daughter     Bipolar disorder Son    Bipolar disorder Son    ADD / ADHD Neg Hx    Anxiety disorder Neg Hx    Dementia Neg Hx    Depression Neg Hx    OCD Neg Hx    Seizures Neg Hx    Paranoid behavior Neg Hx    Colon cancer Neg Hx    Rectal cancer Neg Hx    Social History   Socioeconomic History   Marital status: Married    Spouse name: Lamar   Number of children: 4   Years of education: 12   Highest education level: GED or equivalent  Occupational History   Occupation: disabled  Tobacco Use   Smoking status: Never    Passive exposure: Never   Smokeless tobacco: Never  Vaping Use   Vaping status: Never Used  Substance and Sexual Activity   Alcohol use: No   Drug use: No   Sexual activity: Not Currently    Partners: Male    Birth control/protection: Surgical    Comment: tubal  Other Topics Concern   Not on file  Social History Narrative   Not on file   Social Drivers of Health   Financial Resource Strain: Low Risk  (11/11/2023)   Overall Financial Resource Strain (CARDIA)    Difficulty of Paying Living Expenses: Not hard at all  Food Insecurity: No Food Insecurity (11/11/2023)   Hunger Vital Sign    Worried About Running Out of Food in the Last Year: Never true    Ran Out of Food in the Last Year: Never true  Transportation Needs: No Transportation Needs (11/11/2023)   PRAPARE - Administrator, Civil Service (Medical): No    Lack of Transportation (Non-Medical): No  Physical Activity: Patient Declined (11/11/2023)   Exercise Vital Sign    Days of Exercise per Week: Patient declined    Minutes of Exercise per Session: Patient declined  Stress: No Stress Concern Present (11/11/2023)   [employer] of Occupational Health - Occupational Stress Questionnaire    Feeling of Stress : Only a little  Social Connections: Moderately Isolated (11/11/2023)   Social Connection and Isolation Panel [NHANES]    Frequency of Communication with Friends and Family: More than three  times a week    Frequency of Social Gatherings with Friends and Family: Patient declined    Attends Religious Services: Never    Database Administrator or Organizations: No    Attends Engineer, Structural: Never    Marital Status: Married    Tobacco Counseling Counseling given: Yes   Clinical Intake:  Pre-visit preparation completed: Yes  Pain : 0-10 Pain Score: 8  Pain Type: Chronic pain Pain Location: Back Pain Orientation: Lower Pain Descriptors / Indicators: Aching, Constant Pain Onset: More than a month ago Pain Frequency: Constant     BMI - recorded: 26.47 Nutritional Risks: None Diabetes: Yes CBG done?: No Did pt. bring in CBG monitor from home?: No  How often do you need to have someone help you when you read instructions, pamphlets, or other written materials from your doctor or pharmacy?: 1 - Never  Interpreter Needed?: No  Information entered by :: Karra Pink, CMA   Activities of Daily Living    11/11/2023    8:02 AM  In your present state of health, do you have any difficulty performing the following activities:  Hearing? 0  Vision? 0  Difficulty concentrating or making decisions? 0  Walking or climbing stairs? 0  Dressing or bathing? 0  Doing errands, shopping? 0  Preparing Food and eating ? N  Using the Toilet? N  In the past six months, have you accidently leaked urine? N  Do you have problems with loss of bowel control? N  Managing your Medications? N  Managing your Finances? N  Housekeeping or managing your Housekeeping? N    Patient Care Team: Antonetta Rollene BRAVO, MD as PCP - General Pyrtle, Gordy HERO, MD as Consulting Physician (Gastroenterology) Okey Barnie SAUNDERS, MD as Consulting Physician [facility]) Rain[place], Ellender POUR, PA-C as Physician Assistant (Physician Assistant)  Indicate any recent Medical Services you may have received from other than [facility] providers in the past year (date may be approximate).      Assessment:   This is a routine wellness examination for [place].  Hearing/Vision screen Hearing Screening - Comments:: Patient denies any hearing difficulties.   Vision Screening - Comments:: Wears rx glasses - up to date with routine eye exams  [facility]    Goals Addressed             This Visit's Progress    Patient Stated       To get my house. I currently live in an apartment       Depression Screen    11/11/2023    8:03  AM 10/20/2023    9:05 AM 09/22/2023    2:43 PM 08/25/2023    3:03 PM 07/01/2023    8:36 AM 06/27/2023   10:00 AM 05/07/2023    9:01 AM  PHQ 2/9 Scores  PHQ - 2 Score 0 0 0 0 0 0 2  PHQ- 9 Score 0          Fall Risk    11/11/2023    8:01 AM 10/20/2023    9:05 AM 09/22/2023    2:43 PM 08/25/2023    2:48 PM 07/01/2023    8:36 AM  Fall Risk   Falls in the past year? 1 1 0 0 0  Number falls in past yr: 1 0  0 0  Injury with Fall? 0 0  0 0  Risk for fall due to : History of fall(s);Impaired balance/gait    No Fall Risks  Follow up     Falls evaluation completed    MEDICARE RISK AT HOME: Medicare Risk at Home Any stairs in or around the home?: Yes If so, are there any without handrails?: No Home free of loose throw rugs in walkways, pet beds, electrical cords, etc?: Yes Adequate lighting in your home to reduce risk of falls?: Yes Life alert?: No Use of a cane, walker or w/c?: No Grab bars in the bathroom?: Yes Shower chair or bench in shower?: No Elevated toilet seat or a handicapped toilet?: No  TIMED UP AND GO:  Was the test performed?  No    Cognitive Function:        11/11/2023    7:56 AM 11/11/2022    3:18 PM 11/08/2021    8:19 AM 11/01/2020   10:04 AM 03/05/2017    2:40 PM  6CIT Screen  What Year? 0 points 0 points 0 points 0 points 0 points  What month? 0 points 0 points 0 points 0 points 0 points  What time? 0 points 0 points 0 points 0 points 0 points  Count back from 20 0 points 0 points 0 points 0 points 0 points   Months in reverse 0 points 2 points 4 points 0 points 0 points  Repeat phrase 0 points 2 points 2 points 2 points 0 points  Total Score 0 points 4 points 6 points 2 points 0 points    Immunizations Immunization History  Administered Date(s) Administered   Influenza Split 09/03/2012   Influenza Whole 07/28/2007, 08/11/2009, 07/19/2010   Influenza, Seasonal, Injecte, Preservative Fre 07/25/2023, 09/02/2023   Influenza,inj,Quad PF,6+ Mos 09/13/2013, 08/12/2014, 08/01/2015, 07/24/2016, 08/14/2017, 08/12/2018, 08/10/2019, 07/11/2020, 06/20/2022, 08/29/2022   Influenza-Unspecified 05/24/2018, 08/29/2022   Moderna Covid-19 Fall Seasonal Vaccine 31yrs & older 09/02/2023   Moderna SARS-COV2 Booster Vaccination 08/29/2022   Moderna Sars-Covid-2 Vaccination 07/21/2020, 08/14/2020, 03/12/2021   PNEUMOCOCCAL CONJUGATE-20 03/27/2022   PPD Test 02/22/2011, 06/26/2011, 09/18/2012, 11/29/2013, 12/06/2014, 12/09/2017, 01/15/2022, 02/01/2022   Pneumococcal Polysaccharide-23 05/14/2021   Td 10/03/2005   Tdap 12/09/2017, 09/07/2022   Zoster Recombinant(Shingrix) 06/27/2021, 03/27/2022    TDAP status: Up to date  Flu Vaccine status: Up to date  Pneumococcal vaccine status: Not age appropriate for this patient.    Covid-19 vaccine status: Information provided on how to obtain vaccines.   Qualifies for Shingles Vaccine? No   Zostavax completed No   Shingrix Completed?: Yes  Screening Tests Health Maintenance  Topic Date Due   Diabetic kidney evaluation - Urine ACR  09/21/2023   COVID-19 Vaccine (5 - 2024-25 season) 10/28/2023   HEMOGLOBIN A1C  01/01/2024   Diabetic kidney evaluation - eGFR measurement  06/30/2024   Medicare Annual Wellness (AWV)  06/30/2024   MAMMOGRAM  04/07/2025   Colonoscopy  10/12/2026   Cervical Cancer Screening (HPV/Pap Cotest)  06/30/2028   DTaP/Tdap/Td (4 - Td or Tdap) 09/07/2032   INFLUENZA VACCINE  Completed   Hepatitis C Screening  Completed   HIV Screening   Completed   Zoster Vaccines- Shingrix  Completed   HPV VACCINES  Aged Out    Health Maintenance  Health Maintenance Due  Topic Date Due   Diabetic kidney evaluation - Urine ACR  09/21/2023   COVID-19 Vaccine (5 - 2024-25 season) 10/28/2023    Colorectal cancer screening: Type of screening: Colonoscopy. Completed 10/12/2021. Repeat every 5 years  Mammogram status: Completed 04/08/2023. Repeat every year  Bone Density Screening: Not age appropriate for this patient.   Lung Cancer Screening: (Low Dose CT Chest recommended if Age 64-80 years, 20 pack-year currently smoking OR have quit w/in 15years.) does not qualify.   Lung Cancer Screening Referral: na  Additional Screening:  Hepatitis C Screening: does not qualify; Completed   Vision Screening: Recommended annual ophthalmology exams for early detection of glaucoma and other disorders of the eye. Is the patient up to date with their annual eye exam?  Yes  Who is the provider or what is the name of the office in which the patient attends annual eye exams? [facility] If pt is not established with a provider, would they like to be referred to a provider to establish care? No .   Dental Screening: Recommended annual dental exams for proper oral hygiene  Diabetic Foot Exam: Due. Provider made aware  Community Resource Referral / Chronic Care Management: CRR required this visit?  No   CCM required this visit?  No     Plan:     I have personally reviewed and noted the following in the patient's chart:   Medical and social history Use of alcohol, tobacco or illicit drugs  Current medications and supplements including opioid prescriptions. Patient is currently taking opioid prescriptions. Information provided to patient regarding non-opioid alternatives. Patient advised to discuss non-opioid treatment plan with their provider. Functional ability and status Nutritional status Physical activity Advanced  directives List of other physicians Hospitalizations, surgeries, and ER visits in previous 12 months Vitals Screenings to include cognitive, depression, and falls Referrals and appointments  In addition, I have reviewed and discussed with patient certain preventive protocols, quality metrics, and best practice recommendations. A written personalized care plan for preventive services as well as general preventive health recommendations were provided to patient.     M[place] LABOR Teller [place], CMA   11/11/2023   After Visit Summary: (MyChart) Due to this being a telephonic visit, the after visit summary with patients personalized plan was offered to patient via MyChart   Nurse Notes: see routing comment

## 2023-11-19 NOTE — Progress Notes (Signed)
Subjective:    Patient ID: Jane English, female    DOB: January 11, 1971, 53 y.o.   MRN: [id number]  HPI: Jane English is a 53 y.o. female who returns for follow up appointment for chronic pain and medication refill. She states her pain is located in her bilateral shoulders and lower back pain radiating into her bilateral lower extremities. She also reports bilateral hands with tingling and burning. She rates her pain 7.. Her current exercise regime is walking and performing stretching exercises.  Jane English Morphine equivalent is 30.00 MME.   Last UDS was Performed on 08/25/2023, it was consistent.      Pain Inventory Average Pain 8 Pain Right Now 7 My pain is sharp, burning, dull, stabbing, tingling, and aching  In the last 24 hours, has pain interfered with the following? General activity 3 Relation with others 3 Enjoyment of life 3 What TIME of day is your pain at its worst? varies Sleep (in general) Fair  Pain is worse with: walking, bending, sitting, standing, and some activites Pain improves with: heat/ice, pacing activities, and medication Relief from Meds: 6  Family History  Problem Relation Age of Onset   Physical abuse Mother    Alcohol abuse Mother    Cirrhosis Mother    Lung cancer Father    Stomach cancer Father    Esophageal cancer Father    Alcohol abuse Father    Mental illness Father    Diabetes Sister    Hypertension Sister    Bipolar disorder Sister    Schizophrenia Sister    Diabetes Sister    Drug abuse Sister    HIV Sister    Pneumonia Sister        died as a baby   Alcohol abuse Brother    Hypertension Brother    Kidney disease Brother    Diabetes Brother    Drug abuse Brother    Mental illness Brother    Alcohol abuse Brother    Alcohol abuse Brother    Hypertension Brother    Diabetes Brother    Alcohol abuse Brother    Mental illness Brother        in [name]   Alcohol abuse Brother    Alcohol abuse Brother    Bipolar disorder Brother     Hypertension Brother    Bipolar disorder Brother    Drug abuse Brother    Alcohol abuse Brother    Bipolar disorder Brother    Bipolar disorder Daughter    Bipolar disorder Son    Bipolar disorder Son    ADD / ADHD Neg Hx    Anxiety disorder Neg Hx    Dementia Neg Hx    Depression Neg Hx    OCD Neg Hx    Seizures Neg Hx    Paranoid behavior Neg Hx    Colon cancer Neg Hx    Rectal cancer Neg Hx    Social History   Socioeconomic History   Marital status: Married    Spouse name: Molly Maduro   Number of children: 4   Years of education: 12   Highest education level: GED or equivalent  Occupational History   Occupation: disabled  Tobacco Use   Smoking status: Never    Passive exposure: Never   Smokeless tobacco: Never  Vaping Use   Vaping status: Never Used  Substance and Sexual Activity   Alcohol use: No   Drug use: No   Sexual activity: Not Currently  Partners: Male    Birth control/protection: Surgical    Comment: tubal  Other Topics Concern   Not on file  Social History Narrative   Not on file   Social Drivers of Health   Financial Resource Strain: Low Risk  (11/11/2023)   Overall Financial Resource Strain (CARDIA)    Difficulty of Paying Living Expenses: Not hard at all  Food Insecurity: No Food Insecurity (11/11/2023)   Hunger Vital Sign    Worried About Running Out of Food in the Last Year: Never true    Ran Out of Food in the Last Year: Never true  Transportation Needs: No Transportation Needs (11/11/2023)   PRAPARE - Administrator, Civil Service (Medical): No    Lack of Transportation (Non-Medical): No  Physical Activity: Patient Declined (11/11/2023)   Exercise Vital Sign    Days of Exercise per Week: Patient declined    Minutes of Exercise per Session: Patient declined  Stress: No Stress Concern Present (11/11/2023)   [employer] of Occupational Health - Occupational Stress Questionnaire    Feeling of Stress : Only a little  Social  Connections: Moderately Isolated (11/11/2023)   Social Connection and Isolation Panel [NHANES]    Frequency of Communication with Friends and Family: More than three times a week    Frequency of Social Gatherings with Friends and Family: Patient declined    Attends Religious Services: Never    Database administrator or Organizations: No    Attends Engineer, structural: Never    Marital Status: Married   Past Surgical History:  Procedure Laterality Date   ANTERIOR CERVICAL DECOMP/DISCECTOMY FUSION  07/07/2012   Procedure: ANTERIOR CERVICAL DECOMPRESSION/DISCECTOMY FUSION 2 LEVELS;  Surgeon: Karn Cassis, MD;  Location: [facility];  Service: Neurosurgery;  Laterality: N/A;  Cervical four-five, five - six  Anterior cervical decompression/diskectomy/fusion/plate   APPENDECTOMY  11/04/1984   BOWEL RESECTION N/A 07/29/2013   Procedure: serosal repair;  Surgeon: Ardeth Sportsman, MD;  Location: [facility];  Service: General;  Laterality: N/A;   CARPAL TUNNEL RELEASE Bilateral    COLON SURGERY N/A    Phreesia 07/29/2020   LAPAROSCOPY N/A 07/29/2013   Procedure: diagnostic laporoscopy;  Surgeon: Ardeth Sportsman, MD;  Location: [facility];  Service: General;  Laterality: N/A;   LAPAROSCOPY N/A 08/16/2013   Procedure: LAPAROSCOPY DIAGNOSTIC/LYSIS OF ADHESIONS;  Surgeon: Ardeth Sportsman, MD;  Location: [facility];  Service: General;  Laterality: N/A;   LAPAROTOMY N/A 08/16/2013   Procedure: EXPLORATORY LAPAROTOMY/SMALL BOWEL RESECTION (JEJUNUM);  Surgeon: Ardeth Sportsman, MD;  Location: [facility];  Service: General;  Laterality: N/A;   LUMBAR SPINE SURGERY  11/04/2008   x 3   LYSIS OF ADHESION  11/04/2001   Dr. Elpidio Anis   LYSIS OF ADHESION N/A 07/29/2013   Procedure: LYSIS OF ADHESION;  Surgeon: Ardeth Sportsman, MD;  Location: [facility];  Service: General;  Laterality: N/A;   OOPHORECTOMY     PARTIAL HYSTERECTOMY  1990s?   [place], [place]   SPINAL CORD STIMULATOR IMPLANT     spine stimulator  removal  12/13/2022   SPINE SURGERY N/A    Phreesia 07/29/2020   TRIGGER FINGER RELEASE  11/05/2007   right pinkie finger   TUBAL LIGATION  11/04/1992   Past Surgical History:  Procedure Laterality Date   ANTERIOR CERVICAL DECOMP/DISCECTOMY FUSION  07/07/2012   Procedure: ANTERIOR CERVICAL DECOMPRESSION/DISCECTOMY FUSION 2 LEVELS;  Surgeon: Karn Cassis, MD;  Location: [facility];  Service: Neurosurgery;  Laterality: N/A;  Cervical four-five, five - six  Anterior cervical decompression/diskectomy/fusion/plate   APPENDECTOMY  11/04/1984   BOWEL RESECTION N/A 07/29/2013   Procedure: serosal repair;  Surgeon: Ardeth Sportsman, MD;  Location: [facility];  Service: General;  Laterality: N/A;   CARPAL TUNNEL RELEASE Bilateral    COLON SURGERY N/A    Phreesia 07/29/2020   LAPAROSCOPY N/A 07/29/2013   Procedure: diagnostic laporoscopy;  Surgeon: Ardeth Sportsman, MD;  Location: [facility];  Service: General;  Laterality: N/A;   LAPAROSCOPY N/A 08/16/2013   Procedure: LAPAROSCOPY DIAGNOSTIC/LYSIS OF ADHESIONS;  Surgeon: Ardeth Sportsman, MD;  Location: [facility];  Service: General;  Laterality: N/A;   LAPAROTOMY N/A 08/16/2013   Procedure: EXPLORATORY LAPAROTOMY/SMALL BOWEL RESECTION (JEJUNUM);  Surgeon: Ardeth Sportsman, MD;  Location: [facility];  Service: General;  Laterality: N/A;   LUMBAR SPINE SURGERY  11/04/2008   x 3   LYSIS OF ADHESION  11/04/2001   Dr. Elpidio Anis   LYSIS OF ADHESION N/A 07/29/2013   Procedure: LYSIS OF ADHESION;  Surgeon: Ardeth Sportsman, MD;  Location: [facility];  Service: General;  Laterality: N/A;   OOPHORECTOMY     PARTIAL HYSTERECTOMY  1990s?   [place], [place]   SPINAL CORD STIMULATOR IMPLANT     spine stimulator removal  12/13/2022   SPINE SURGERY N/A    Phreesia 07/29/2020   TRIGGER FINGER RELEASE  11/05/2007   right pinkie finger   TUBAL LIGATION  11/04/1992   Past Medical History:  Diagnosis Date   Anemia    Asthma    Asthma flare 04/09/2013   Back pain     Bronchitis    Chronic abdominal pain    Chronic constipation    Constipation due to opioid therapy    Depression    Depression, major, single episode, severe (HCC) 10/03/2018   PHQ 9 score of 15   Diabetes mellitus without complication (HCC)    Diabetes mellitus, type II (HCC)    DVT (deep venous thrombosis) (HCC) 2010   GERD (gastroesophageal reflux disease)    Heart murmur    no cardiologist   Helicobacter pylori gastritis 06/11/2013   Colonoscopy Dr. Rhea Belton   Hypertension    IBS (irritable bowel syndrome)    Migraine headache    Neuropathy    Obesity    Obsessive-compulsive disorder    PSYCHOTIC D/O W/HALLUCINATIONS CONDS CLASS ELSW 03/04/2010   Qualifier: Diagnosis of  By: Lodema Hong MD, Margaret     PTSD (post-traumatic stress disorder)    SBO (small bowel obstruction) (HCC) 08/09/2013   Seasonal allergies 12/10/2012   Seizures (HCC)    Shortness of breath    BP 121/72   Ht 5\' 6"  (1.676 m)   Wt 163 lb (73.9 kg)   SpO2 96%   BMI 26.31 kg/m   Opioid Risk Score:   Fall Risk Score:  `1  Depression screen [facility] 2/9     11/11/2023    8:03 AM 10/20/2023    9:05 AM 09/22/2023    2:43 PM 08/25/2023    3:03 PM 07/01/2023    8:36 AM 06/27/2023   10:00 AM 05/07/2023    9:01 AM  Depression screen PHQ 2/9  Decreased Interest 0 0 0 0 0 0 1  Down, Depressed, Hopeless 0 0 0 0 0 0 1  PHQ - 2 Score 0 0 0 0 0 0 2  Altered sleeping 0        Tired,  decreased energy 0        Change in appetite 0        Feeling bad or failure about yourself  0        Trouble concentrating 0        Moving slowly or fidgety/restless 0        Suicidal thoughts 0        PHQ-9 Score 0        Difficult doing work/chores Not difficult at all          Review of Systems  Musculoskeletal:        Bilateral shoulder pain Right lower leg pain Tingling in fingers  Neurological:  Positive for weakness and numbness.  All other systems reviewed and are negative.     Objective:   Physical Exam Vitals and nursing  note reviewed.  Constitutional:      Appearance: Normal appearance.  Cardiovascular:     Rate and Rhythm: Normal rate and regular rhythm.     Pulses: Normal pulses.     Heart sounds: Normal heart sounds.  Pulmonary:     Effort: Pulmonary effort is normal.     Breath sounds: Normal breath sounds.  Musculoskeletal:     Comments: Normal Muscle Bulk and Muscle Testing Reveals:  Upper Extremities: Full ROM and Muscle Strength 5/5 Lumbar Paraspinal Tenderness: L-4-L-5 Lower Extremities: Full ROM and Muscle Strength 5/5 Arises from Table with ease Narrow Based  Gait     Skin:    General: Skin is warm and dry.  Neurological:     Mental Status: She is alert and oriented to person, place, and time.  Psychiatric:        Mood and Affect: Mood normal.        Behavior: Behavior normal.         Assessment & Plan:  1.Chronic Pain of Bilateral Shoulders L>R: Continue to alternate Ice and Heat therapy. Continue HEP as Tolerated. Continue to Monitor. 11/20/2023 2. Failed Back Syndrome of Lumbar Syndrome : Continue HEP as Tolerated. Continue current medication regimen. Continue to Monitor. 11/20/2023 3. Lumbar Radiculitis: Continue HEP as Tolerated. Continue to Monitor.11/20/2023 4. Left Knee Pain: No Complaints today. Continue HEP as Tolerated. Continue to Monitor.11/20/2023 5. Muscle Spasm of Shoulders/ Upper Back : Continue Flexeril. Continue to Monitor.11/20/2023 6. Chronic Pain Syndrome: Refilled: Oxycodone 5mg /325 mg two tablets twice a day #120. We will continue the opioid monitoring program, this consists of regular clinic visits, examinations, urine drug screen, pill counts as well as use of [place] Controlled Substance Reporting system. A 12 month History has been reviewed on the [place] Controlled Substance Reporting System on 11/20/2023. 7. Polyarthralgia: Continue HEP as Tolerated. Continue current medication regimen. Continue to monitor. 11/20/2023   F/U in 1 month

## 2023-11-20 ENCOUNTER — Encounter: Payer: Medicare HMO | Attending: Physical Medicine and Rehabilitation | Admitting: Registered Nurse

## 2023-11-20 DIAGNOSIS — M25512 Pain in left shoulder: Secondary | ICD-10-CM

## 2023-11-20 DIAGNOSIS — Z79899 Other long term (current) drug therapy: Secondary | ICD-10-CM

## 2023-11-20 DIAGNOSIS — M25511 Pain in right shoulder: Secondary | ICD-10-CM | POA: Diagnosis not present

## 2023-11-20 DIAGNOSIS — M5416 Radiculopathy, lumbar region: Secondary | ICD-10-CM | POA: Diagnosis not present

## 2023-11-20 DIAGNOSIS — G8929 Other chronic pain: Secondary | ICD-10-CM | POA: Insufficient documentation

## 2023-11-20 DIAGNOSIS — M792 Neuralgia and neuritis, unspecified: Secondary | ICD-10-CM | POA: Insufficient documentation

## 2023-11-20 DIAGNOSIS — Z5181 Encounter for therapeutic drug level monitoring: Secondary | ICD-10-CM | POA: Diagnosis not present

## 2023-11-20 DIAGNOSIS — G894 Chronic pain syndrome: Secondary | ICD-10-CM

## 2023-11-20 DIAGNOSIS — M961 Postlaminectomy syndrome, not elsewhere classified: Secondary | ICD-10-CM | POA: Diagnosis not present

## 2023-11-20 MED ORDER — OXYCODONE HCL 5 MG PO TABS: 10.0000 mg | ORAL_TABLET | Freq: Two times a day (BID) | ORAL | 0 refills | Status: DC

## 2023-11-24 ENCOUNTER — Encounter: Payer: Self-pay | Admitting: Registered Nurse

## 2023-11-30 DIAGNOSIS — M961 Postlaminectomy syndrome, not elsewhere classified: Secondary | ICD-10-CM | POA: Diagnosis not present

## 2023-12-03 ENCOUNTER — Other Ambulatory Visit: Payer: Self-pay | Admitting: Family Medicine

## 2023-12-08 ENCOUNTER — Other Ambulatory Visit: Payer: Self-pay | Admitting: Physical Medicine and Rehabilitation

## 2023-12-09 ENCOUNTER — Telehealth: Payer: Self-pay

## 2023-12-09 NOTE — Telephone Encounter (Signed)
 Per Dr. Lorilee: Jane English left a message on her cell phone stating she need a PA completed for medication. Also she no longer using her cell phone for patient contacts.   A message has been left on [phone] for [facility] to call the office clinic line back with Rx details. Which medication is needing the PA? Also she has been in formed of Dr. Lennette cell phone usage.

## 2023-12-09 NOTE — Telephone Encounter (Signed)
PA FOR OXYCODONE 5 MG created over the phone. Office notes & Opioid Agreement  faxed to [employer] [phone].   Patient informed. Also advised to try the Good Rx card if needed. Because it is a new Financial controller.

## 2023-12-10 NOTE — Telephone Encounter (Signed)
Fax on 12/09/2023 Oxycodone 5 MG approved 12/09/2023-01/08/2024 [id number]. Information faxed to pharmacy.

## 2023-12-15 ENCOUNTER — Telehealth: Payer: 59

## 2023-12-15 ENCOUNTER — Encounter: Payer: Self-pay | Admitting: Family Medicine

## 2023-12-15 ENCOUNTER — Telehealth (INDEPENDENT_AMBULATORY_CARE_PROVIDER_SITE_OTHER): Payer: 59 | Admitting: Family Medicine

## 2023-12-15 VITALS — Ht 66.0 in | Wt 161.2 lb

## 2023-12-15 DIAGNOSIS — J40 Bronchitis, not specified as acute or chronic: Secondary | ICD-10-CM | POA: Diagnosis not present

## 2023-12-15 MED ORDER — DOXYCYCLINE HYCLATE 100 MG PO TABS: 100.0000 mg | ORAL_TABLET | Freq: Two times a day (BID) | ORAL | 0 refills | Status: AC

## 2023-12-15 MED ORDER — PROMETHAZINE-DM 6.25-15 MG/5ML PO SYRP: ORAL_SOLUTION | ORAL | 0 refills | Status: DC

## 2023-12-15 MED ORDER — PREDNISONE 20 MG PO TABS: 40.0000 mg | ORAL_TABLET | Freq: Every day | ORAL | 0 refills | Status: AC

## 2023-12-15 NOTE — Progress Notes (Signed)
 Virtual Visit via Video Note  I connected with Jane English on 12/15/23 at  3:20 PM EST by a video enabled telemedicine application and verified that I am speaking with the correct person using two identifiers.  Patient Location: Other:  driving Provider Location: Home Office  I discussed the limitations, risks, security, and privacy concerns of performing an evaluation and management service by video and the availability of in person appointments. I also discussed with the patient that there may be a patient responsible charge related to this service. The patient expressed understanding and agreed to proceed.  Subjective: PCP: Towanda Fret, MD  Chief Complaint  Patient presents with   Acute Visit    Headache, cough, chest and head congestion w/ SOB. X 1 wk.    HPI The patient is seen today with complaints of cough, chest congestion, headaches, and body aches, accompanied by shortness of breath for about one week. She reports minimal relief with over-the-counter treatments and denies recent exposure to COVID-19.    ROS: Per HPI  Current Outpatient Medications:    doxycycline  (VIBRA -TABS) 100 MG tablet, Take 1 tablet (100 mg total) by mouth 2 (two) times daily for 5 days., Disp: 10 tablet, Rfl: 0   predniSONE  (DELTASONE ) 20 MG tablet, Take 2 tablets (40 mg total) by mouth daily for 5 days., Disp: 10 tablet, Rfl: 0   ACCU-CHEK GUIDE test strip, USE AS INSTRUCTED ONCE DAILY DX E11.9, Disp: 100 strip, Rfl: 5   albuterol  (PROVENTIL ) (2.5 MG/3ML) 0.083% nebulizer solution, Take 3 mLs (2.5 mg total) by nebulization every 6 (six) hours as needed for wheezing or shortness of breath., Disp: 150 mL, Rfl: 1   albuterol  (VENTOLIN  HFA) 108 (90 Base) MCG/ACT inhaler, TAKE 2 PUFFS BY MOUTH EVERY 6 HOURS AS NEEDED FOR WHEEZE OR SHORTNESS OF BREATH, Disp: 18 each, Rfl: 4   amitriptyline  (ELAVIL ) 10 MG tablet, TAKE 1 TABLET BY MOUTH EVERYDAY AT BEDTIME, Disp: 90 tablet, Rfl: 0    beclomethasone (QVAR  REDIHALER) 40 MCG/ACT inhaler, Inhale 2 puffs into the lungs 2 (two) times daily., Disp: 10.6 g, Rfl: 5   benzonatate  (TESSALON ) 100 MG capsule, Take 1 capsule (100 mg total) by mouth 2 (two) times daily as needed for cough., Disp: 20 capsule, Rfl: 0   benzoyl peroxide  5 % external liquid, Apply topically 2 (two) times daily., Disp: 142 g, Rfl: 12   blood glucose meter kit and supplies, Dispense based on patient and insurance preference. Once daily testing DX E11.9, Disp: 1 each, Rfl: 0   BREO ELLIPTA  100-25 MCG/ACT AEPB, INHALE 1 PUFF INTO THE LUNGS TWICE A DAY, Disp: 60 each, Rfl: 1   cloNIDine  (CATAPRES ) 0.1 MG tablet, TAKE 1 TABLET BY MOUTH EVERYDAY AT BEDTIME, Disp: 90 tablet, Rfl: 1   clotrimazole -betamethasone  (LOTRISONE ) cream, APPLY TWICE DAILY FOR 5 DAYS TO AFFECTED AREA(S) THEN AS NEEDED, Disp: 45 g, Rfl: 1   cromolyn  (OPTICROM ) 4 % ophthalmic solution, Place 1 drop into both eyes 4 (four) times daily., Disp: 10 mL, Rfl: 12   cyclobenzaprine  (FLEXERIL ) 5 MG tablet, Take 1 tablet (5 mg total) by mouth 3 (three) times daily as needed for muscle spasms., Disp: 30 tablet, Rfl: 3   cycloSPORINE  (RESTASIS ) 0.05 % ophthalmic emulsion, Place 1 drop into both eyes 2 (two) times daily., Disp: 180 each, Rfl: 0   diclofenac  Sodium (VOLTAREN ) 1 % GEL, APPLY 2 GRAMS TO AFFECTED AREA 4 TIMES A DAY, Disp: 100 g, Rfl: 4   EPINEPHrine  0.3  mg/0.3 mL IJ SOAJ injection, as needed., Disp: , Rfl:    ergocalciferol  (VITAMIN D2) 1.25 MG (50000 UT) capsule, Take 1 capsule (50,000 Units total) by mouth once a week. One capsule once weekly, Disp: 12 capsule, Rfl: 2   Fluocinolone  Acetonide Body 0.01 % OIL, Apply 1 Application topically every other day., Disp: 120 mL, Rfl: 5   fluticasone  (FLONASE ) 50 MCG/ACT nasal spray, PLACE 1-2 SPRAYS INTO BOTH NOSTRILS DAILY., Disp: 48 mL, Rfl: 1   hydrOXYzine  (VISTARIL ) 25 MG capsule, TAKE 1 CAPSULE BY MOUTH THREE TIMES A DAY, Disp: 270 capsule, Rfl: 1    ipratropium (ATROVENT ) 0.03 % nasal spray, Place 2 sprays into both nostrils every 12 (twelve) hours., Disp: 30 mL, Rfl: 0   Lancets 30G MISC, Once daily testing dx e11.9, Disp: 100 each, Rfl: 5   lansoprazole  (PREVACID ) 30 MG capsule, Take 1 capsule (30 mg total) by mouth daily., Disp: 90 capsule, Rfl: 3   lidocaine  (LIDODERM ) 5 %, Place 1 patch onto the skin daily. Remove & Discard patch within 12 hours or as directed by MD, Disp: 30 patch, Rfl: 0   lidocaine  (XYLOCAINE ) 2 % jelly, , Disp: , Rfl:    linaclotide  (LINZESS ) 290 MCG CAPS capsule, TAKE 1 CAPSULE (290 MCG TOTAL) BY MOUTH DAILY BEFORE BREAKFAST., Disp: 90 capsule, Rfl: 0   loratadine  (CLARITIN ) 10 MG tablet, TAKE 1 TABLET BY MOUTH EVERY DAY, Disp: 90 tablet, Rfl: 1   lubiprostone  (AMITIZA ) 24 MCG capsule, TAKE 1 CAPSULE (24 MCG TOTAL) BY MOUTH 2 (TWO) TIMES DAILY WITH A MEAL., Disp: 180 capsule, Rfl: 0   magnesium  oxide (MAG-OX) 400 MG tablet, Take 1 tablet (400 mg total) by mouth 2 (two) times daily., Disp: 90 tablet, Rfl: 3   meloxicam  (MOBIC ) 7.5 MG tablet, TAKE 1 TABLET BY MOUTH 2 TIMES DAILY AS NEEDED FOR PAIN., Disp: 60 tablet, Rfl: 11   methocarbamol  (ROBAXIN ) 500 MG tablet, Take 1 tablet (500 mg total) by mouth 4 (four) times daily., Disp: 120 tablet, Rfl: 3   montelukast  (SINGULAIR ) 10 MG tablet, TAKE 1 TABLET BY MOUTH EVERYDAY AT BEDTIME, Disp: 90 tablet, Rfl: 0   Na Sulfate-K Sulfate-Mg Sulf 17.5-3.13-1.6 GM/177ML SOLN, Take as directed, Disp: 354 mL, Rfl: 0   NARCAN  4 MG/0.1ML LIQD nasal spray kit, Place 4 mg into the nose as directed., Disp: , Rfl: 1   NONFORMULARY OR COMPOUNDED ITEM, Apply 1-2 pumps, 3-4 times daily as needed., Disp: 1 each, Rfl: 0   ondansetron  (ZOFRAN ) 4 MG tablet, Take 1 tablet (4 mg total) by mouth every 8 (eight) hours as needed for nausea or vomiting., Disp: 20 tablet, Rfl: 0   oxybutynin  (DITROPAN -XL) 10 MG 24 hr tablet, TAKE 1 TABLET BY MOUTH EVERYDAY AT BEDTIME, Disp: 90 tablet, Rfl: 2   oxyCODONE   (ROXICODONE ) 5 MG immediate release tablet, Take 2 tablets (10 mg total) by mouth 2 (two) times daily., Disp: 120 tablet, Rfl: 0   pantoprazole  (PROTONIX ) 40 MG tablet, Take 1 tablet (40 mg total) by mouth daily., Disp: 90 tablet, Rfl: 1   PARoxetine  (PAXIL ) 10 MG tablet, TAKE 1 TABLET BY MOUTH EVERY DAY, Disp: 90 tablet, Rfl: 1   potassium chloride  20 MEQ/15ML (10%) SOLN, Take 15 mLs (20 mEq total) by mouth 2 (two) times daily., Disp: 473 mL, Rfl: 1   promethazine -dextromethorphan (PROMETHAZINE -DM) 6.25-15 MG/5ML syrup, Take one teaspoon by mouth at bedtime, as needed, for excessive cough, Disp: 118 mL, Rfl: 0   rosuvastatin  (CRESTOR ) 5 MG tablet, TAKE  1 TABLET (5 MG TOTAL) BY MOUTH DAILY., Disp: 90 tablet, Rfl: 1   Safety Seal Miscellaneous MISC, Apply 1 Application topically daily. Medication Name: AA Gel, For first week use Every other day, If tolerated Well, increase to daily use, Disp: 30 g, Rfl: 4   Safety Seal Miscellaneous MISC, 1 Application by Does not apply route in the morning. MEDICATION NAME: AA gel, Disp: 30 g, Rfl: 5   solifenacin  (VESICARE ) 5 MG tablet, TAKE 1 TABLET (5 MG TOTAL) BY MOUTH DAILY., Disp: 90 tablet, Rfl: 1   tirzepatide  (MOUNJARO ) 2.5 MG/0.5ML Pen, INJECT 2.5 MG SUBCUTANEOUSLY WEEKLY, Disp: 2 mL, Rfl: 1   tirzepatide  (MOUNJARO ) 5 MG/0.5ML Pen, Inject 5 mg into the skin once a week., Disp: 6 mL, Rfl: 1   topiramate  (TOPAMAX ) 25 MG tablet, TAKE 1 TABLET BY MOUTH TWICE A DAY, Disp: 180 tablet, Rfl: 3   trolamine salicylate (ASPERCREME) 10 % cream, Apply 1 application topically as needed for muscle pain. , Disp: , Rfl:   Observations/Objective: Today's Vitals   12/15/23 1511  Weight: 161 lb 3.2 oz (73.1 kg)  Height: 5\' 6"  (1.676 m)  PainSc: 10-Worst pain ever  PainLoc: Generalized   Physical Exam Head is normocephalic, atraumatic.  No labored breathing.  Speech is clear and coherent with logical content.  Patient is alert and oriented at baseline.   Assessment  and Plan: Bronchitis -     predniSONE ; Take 2 tablets (40 mg total) by mouth daily for 5 days.  Dispense: 10 tablet; Refill: 0 -     Promethazine -DM; Take one teaspoon by mouth at bedtime, as needed, for excessive cough  Dispense: 118 mL; Refill: 0 -     Doxycycline  Hyclate; Take 1 tablet (100 mg total) by mouth 2 (two) times daily for 5 days.  Dispense: 10 tablet; Refill: 0   Start taking Promethazine  DM 5 mL by mouth every 4 hours as needed for cough and cold symptoms. Doxycycline  prescribed for anaerobic coverage increase fluid intake and allow for plenty of rest. Take Tylenol  as needed for pain, fever, or general discomfort. Perform warm saltwater gargles 3-4 times daily to help with throat pain or discomfort. (Mix 1/2 teaspoon of salt in a glass of warm water and gargle several times daily to reduce throat inflammation and soothe irritation.) Ginger tea: Reduces throat irritation and can help with nausea. Look for sugar-free or honey-based throat lozenges to help with a sore throat. Use a humidifier at bedtime to help with cough and nasal congestion. For nasal congestion: The use of heated humidified air is a safe and effective therapy. Saline nasal sprays may also help alleviate nasal symptoms of the common cold. For cough: Treatment with honey may reduce cough frequency and severity.   Follow Up Instructions: encouraged to follow her symptoms worsen sore failure improve   I discussed the assessment and treatment plan with the patient. The patient was provided an opportunity to ask questions, and all were answered. The patient agreed with the plan and demonstrated an understanding of the instructions.   The patient was advised to call back or seek an in-person evaluation if the symptoms worsen or if the condition fails to improve as anticipated.  The above assessment and management plan was discussed with the patient. The patient verbalized understanding of and has agreed to the  management plan.   Miles Leyda, FNP

## 2023-12-17 ENCOUNTER — Ambulatory Visit (INDEPENDENT_AMBULATORY_CARE_PROVIDER_SITE_OTHER): Payer: 59 | Admitting: Family Medicine

## 2023-12-17 ENCOUNTER — Other Ambulatory Visit (HOSPITAL_COMMUNITY): Payer: Self-pay | Admitting: Family Medicine

## 2023-12-17 ENCOUNTER — Encounter: Payer: Self-pay | Admitting: Family Medicine

## 2023-12-17 ENCOUNTER — Ambulatory Visit (HOSPITAL_COMMUNITY)
Admission: RE | Admit: 2023-12-17 | Discharge: 2023-12-17 | Disposition: A | Discharge status: Home / Self Care | Payer: 59 | Source: Ambulatory Visit | Attending: Family Medicine | Admitting: Family Medicine

## 2023-12-17 VITALS — BP 117/78 | HR 75 | Ht 66.0 in | Wt 165.0 lb

## 2023-12-17 DIAGNOSIS — I10 Essential (primary) hypertension: Secondary | ICD-10-CM | POA: Diagnosis not present

## 2023-12-17 DIAGNOSIS — B351 Tinea unguium: Secondary | ICD-10-CM

## 2023-12-17 DIAGNOSIS — F411 Generalized anxiety disorder: Secondary | ICD-10-CM

## 2023-12-17 DIAGNOSIS — J0191 Acute recurrent sinusitis, unspecified: Secondary | ICD-10-CM | POA: Diagnosis not present

## 2023-12-17 DIAGNOSIS — E785 Hyperlipidemia, unspecified: Secondary | ICD-10-CM | POA: Diagnosis not present

## 2023-12-17 DIAGNOSIS — E559 Vitamin D deficiency, unspecified: Secondary | ICD-10-CM | POA: Diagnosis not present

## 2023-12-17 DIAGNOSIS — R509 Fever, unspecified: Secondary | ICD-10-CM | POA: Diagnosis not present

## 2023-12-17 DIAGNOSIS — J209 Acute bronchitis, unspecified: Secondary | ICD-10-CM | POA: Diagnosis not present

## 2023-12-17 DIAGNOSIS — R87610 Atypical squamous cells of undetermined significance on cytologic smear of cervix (ASC-US): Secondary | ICD-10-CM

## 2023-12-17 DIAGNOSIS — J018 Other acute sinusitis: Secondary | ICD-10-CM

## 2023-12-17 DIAGNOSIS — F321 Major depressive disorder, single episode, moderate: Secondary | ICD-10-CM

## 2023-12-17 DIAGNOSIS — J019 Acute sinusitis, unspecified: Secondary | ICD-10-CM | POA: Insufficient documentation

## 2023-12-17 DIAGNOSIS — E1169 Type 2 diabetes mellitus with other specified complication: Secondary | ICD-10-CM | POA: Diagnosis not present

## 2023-12-17 DIAGNOSIS — R059 Cough, unspecified: Secondary | ICD-10-CM | POA: Diagnosis not present

## 2023-12-17 DIAGNOSIS — Z1231 Encounter for screening mammogram for malignant neoplasm of breast: Secondary | ICD-10-CM

## 2023-12-17 DIAGNOSIS — Z13220 Encounter for screening for lipoid disorders: Secondary | ICD-10-CM

## 2023-12-17 NOTE — Patient Instructions (Addendum)
  F/u in 13 weeks , call if you need me sooner    Urine aCR, tSH, cbc and diff , cmp and EGFr, hBa1c and vit D today  Pls get CXR and sinus X ray today  Sputum to be submitted for culture as soon as good specimen is available   Med for fungal nail infection to be started after updated liver function obtained  Gyne referral will be placed for abn pap  You need to update  your depression and anxiety screen today so referral you requested can be placed  Thanks for choosing  Primary Care, we consider it a privelige to serve you.

## 2023-12-17 NOTE — Assessment & Plan Note (Addendum)
Rcurrent , pt to send in sputum c/s , and get cXR today stat also labs, she is to continue course of doxycyline and prednisone that she was started on 2 days ago

## 2023-12-18 ENCOUNTER — Encounter: Payer: Self-pay | Admitting: Family Medicine

## 2023-12-18 LAB — CMP14+EGFR
ALT: 24 [IU]/L (ref 0–32)
AST: 22 [IU]/L (ref 0–40)
Albumin: 4.2 g/dL (ref 3.8–4.9)
Alkaline Phosphatase: 80 [IU]/L (ref 44–121)
BUN/Creatinine Ratio: 15 (ref 9–23)
BUN: 14 mg/dL (ref 6–24)
Bilirubin Total: 0.3 mg/dL (ref 0.0–1.2)
CO2: 23 mmol/L (ref 20–29)
Calcium: 8.9 mg/dL (ref 8.7–10.2)
Chloride: 105 mmol/L (ref 96–106)
Creatinine, Ser: 0.96 mg/dL (ref 0.57–1.00)
Globulin, Total: 2.3 g/dL (ref 1.5–4.5)
Glucose: 76 mg/dL (ref 70–99)
Potassium: 3.5 mmol/L (ref 3.5–5.2)
Sodium: 143 mmol/L (ref 134–144)
Total Protein: 6.5 g/dL (ref 6.0–8.5)
eGFR: 70 mL/min/{1.73_m2} (ref >59)

## 2023-12-18 LAB — CBC WITH DIFFERENTIAL/PLATELET
Basophils Absolute: 0.1 10*3/uL (ref 0.0–0.2)
Basos: 1 %
EOS (ABSOLUTE): 0.5 10*3/uL — ABNORMAL HIGH (ref 0.0–0.4)
Eos: 6 %
Hematocrit: 35.3 % (ref 34.0–46.6)
Hemoglobin: 12.2 g/dL (ref 11.1–15.9)
Immature Grans (Abs): 0 10*3/uL (ref 0.0–0.1)
Immature Granulocytes: 0 %
Lymphocytes Absolute: 3.2 10*3/uL — ABNORMAL HIGH (ref 0.7–3.1)
Lymphs: 41 %
MCH: 31.6 pg (ref 26.6–33.0)
MCHC: 34.6 g/dL (ref 31.5–35.7)
MCV: 92 fL (ref 79–97)
Monocytes Absolute: 0.6 10*3/uL (ref 0.1–0.9)
Monocytes: 8 %
Neutrophils Absolute: 3.6 10*3/uL (ref 1.4–7.0)
Neutrophils: 44 %
Platelets: 273 10*3/uL (ref 150–450)
RBC: 3.86 x10E6/uL (ref 3.77–5.28)
RDW: 12.4 % (ref 11.7–15.4)
WBC: 8 10*3/uL (ref 3.4–10.8)

## 2023-12-18 LAB — MICROALBUMIN / CREATININE URINE RATIO
Creatinine, Urine: 201.3 mg/dL
Microalb/Creat Ratio: 5 mg/g{creat} (ref 0–29)
Microalbumin, Urine: 11 ug/mL

## 2023-12-18 LAB — TSH: TSH: 2.49 u[IU]/mL (ref 0.450–4.500)

## 2023-12-18 LAB — HEMOGLOBIN A1C
Est. average glucose Bld gHb Est-mCnc: 123 mg/dL
Hgb A1c MFr Bld: 5.9 % — ABNORMAL HIGH (ref 4.8–5.6)

## 2023-12-18 LAB — VITAMIN D 25 HYDROXY (VIT D DEFICIENCY, FRACTURES): Vit D, 25-Hydroxy: 38.7 ng/mL (ref 30.0–100.0)

## 2023-12-19 ENCOUNTER — Ambulatory Visit: Payer: 59 | Admitting: Family Medicine

## 2023-12-19 DIAGNOSIS — J209 Acute bronchitis, unspecified: Secondary | ICD-10-CM | POA: Diagnosis not present

## 2023-12-22 ENCOUNTER — Other Ambulatory Visit: Payer: Self-pay

## 2023-12-22 ENCOUNTER — Encounter (HOSPITAL_BASED_OUTPATIENT_CLINIC_OR_DEPARTMENT_OTHER): Payer: Self-pay | Admitting: *Deleted

## 2023-12-22 ENCOUNTER — Emergency Department (HOSPITAL_BASED_OUTPATIENT_CLINIC_OR_DEPARTMENT_OTHER): Payer: 59

## 2023-12-22 ENCOUNTER — Ambulatory Visit: Payer: 59

## 2023-12-22 ENCOUNTER — Emergency Department (HOSPITAL_BASED_OUTPATIENT_CLINIC_OR_DEPARTMENT_OTHER): Payer: 59 | Admitting: Radiology

## 2023-12-22 ENCOUNTER — Emergency Department (HOSPITAL_BASED_OUTPATIENT_CLINIC_OR_DEPARTMENT_OTHER)
Admission: EM | Admit: 2023-12-22 | Discharge: 2023-12-22 | Disposition: A | Discharge status: Home / Self Care | Payer: 59 | Attending: Emergency Medicine | Admitting: Emergency Medicine

## 2023-12-22 DIAGNOSIS — Z0389 Encounter for observation for other suspected diseases and conditions ruled out: Secondary | ICD-10-CM | POA: Diagnosis not present

## 2023-12-22 DIAGNOSIS — E876 Hypokalemia: Secondary | ICD-10-CM | POA: Insufficient documentation

## 2023-12-22 DIAGNOSIS — I10 Essential (primary) hypertension: Secondary | ICD-10-CM | POA: Diagnosis not present

## 2023-12-22 DIAGNOSIS — F411 Generalized anxiety disorder: Secondary | ICD-10-CM | POA: Insufficient documentation

## 2023-12-22 DIAGNOSIS — R059 Cough, unspecified: Secondary | ICD-10-CM | POA: Insufficient documentation

## 2023-12-22 DIAGNOSIS — J40 Bronchitis, not specified as acute or chronic: Secondary | ICD-10-CM | POA: Insufficient documentation

## 2023-12-22 DIAGNOSIS — D649 Anemia, unspecified: Secondary | ICD-10-CM | POA: Diagnosis not present

## 2023-12-22 DIAGNOSIS — R0602 Shortness of breath: Secondary | ICD-10-CM | POA: Insufficient documentation

## 2023-12-22 DIAGNOSIS — Z79899 Other long term (current) drug therapy: Secondary | ICD-10-CM | POA: Insufficient documentation

## 2023-12-22 DIAGNOSIS — F321 Major depressive disorder, single episode, moderate: Secondary | ICD-10-CM | POA: Insufficient documentation

## 2023-12-22 DIAGNOSIS — R87610 Atypical squamous cells of undetermined significance on cytologic smear of cervix (ASC-US): Secondary | ICD-10-CM | POA: Insufficient documentation

## 2023-12-22 DIAGNOSIS — J069 Acute upper respiratory infection, unspecified: Secondary | ICD-10-CM | POA: Diagnosis not present

## 2023-12-22 DIAGNOSIS — Z91040 Latex allergy status: Secondary | ICD-10-CM | POA: Insufficient documentation

## 2023-12-22 DIAGNOSIS — Z7951 Long term (current) use of inhaled steroids: Secondary | ICD-10-CM | POA: Insufficient documentation

## 2023-12-22 LAB — CBC
HCT: 34.9 % — ABNORMAL LOW (ref 36.0–46.0)
Hemoglobin: 11.9 g/dL — ABNORMAL LOW (ref 12.0–15.0)
MCH: 32.1 pg (ref 26.0–34.0)
MCHC: 34.1 g/dL (ref 30.0–36.0)
MCV: 94.1 fL (ref 80.0–100.0)
Platelets: 249 10*3/uL (ref 150–400)
RBC: 3.71 MIL/uL — ABNORMAL LOW (ref 3.87–5.11)
RDW: 13.4 % (ref 11.5–15.5)
WBC: 6.2 10*3/uL (ref 4.0–10.5)
nRBC: 0 % (ref 0.0–0.2)

## 2023-12-22 LAB — GRAM STAIN W/SPUTUM CULT RFLX

## 2023-12-22 LAB — BASIC METABOLIC PANEL
Anion gap: 10 (ref 5–15)
BUN: 15 mg/dL (ref 6–20)
CO2: 26 mmol/L (ref 22–32)
Calcium: 8.9 mg/dL (ref 8.9–10.3)
Chloride: 107 mmol/L (ref 98–111)
Creatinine, Ser: 0.78 mg/dL (ref 0.44–1.00)
GFR, Estimated: >60 mL/min (ref >60)
Glucose, Bld: 114 mg/dL — ABNORMAL HIGH (ref 70–99)
Potassium: 3.1 mmol/L — ABNORMAL LOW (ref 3.5–5.1)
Sodium: 143 mmol/L (ref 135–145)

## 2023-12-22 LAB — RESP PANEL BY RT-PCR (RSV, FLU A&B, COVID)  RVPGX2
Influenza A by PCR: NEGATIVE
Influenza B by PCR: NEGATIVE
Resp Syncytial Virus by PCR: NEGATIVE
SARS Coronavirus 2 by RT PCR: NEGATIVE

## 2023-12-22 MED ORDER — IPRATROPIUM-ALBUTEROL 0.5-2.5 (3) MG/3ML IN SOLN
RESPIRATORY_TRACT | Status: AC
Start: 2023-12-22 — End: 2023-12-22
  Administered 2023-12-22: 3 mL via RESPIRATORY_TRACT
  Filled 2023-12-22: qty 3

## 2023-12-22 MED ORDER — ALBUTEROL SULFATE (2.5 MG/3ML) 0.083% IN NEBU: 2.5000 mg | INHALATION_SOLUTION | Freq: Once | RESPIRATORY_TRACT | Status: AC

## 2023-12-22 MED ORDER — PREDNISONE 50 MG PO TABS
60.0000 mg | ORAL_TABLET | Freq: Once | ORAL | Status: AC
  Administered 2023-12-22: 60 mg via ORAL
  Filled 2023-12-22: qty 1

## 2023-12-22 MED ORDER — ALBUTEROL SULFATE (2.5 MG/3ML) 0.083% IN NEBU
INHALATION_SOLUTION | RESPIRATORY_TRACT | Status: AC
  Administered 2023-12-22: 2.5 mg via RESPIRATORY_TRACT
  Filled 2023-12-22: qty 3

## 2023-12-22 MED ORDER — IPRATROPIUM-ALBUTEROL 0.5-2.5 (3) MG/3ML IN SOLN: 3.0000 mL | Freq: Once | RESPIRATORY_TRACT | Status: AC

## 2023-12-22 MED ORDER — IOHEXOL 350 MG/ML SOLN
100.0000 mL | Freq: Once | INTRAVENOUS | Status: AC | PRN
  Administered 2023-12-22: 75 mL via INTRAVENOUS

## 2023-12-22 MED ORDER — TERBINAFINE HCL 250 MG PO TABS: 250.0000 mg | ORAL_TABLET | Freq: Every day | ORAL | 0 refills | Status: AC

## 2023-12-22 MED ORDER — PREDNISONE 10 MG (21) PO TBPK: ORAL_TABLET | ORAL | 0 refills | Status: AC

## 2023-12-22 NOTE — Assessment & Plan Note (Signed)
Increased  anxiety reported and requests Psych management , referral entered

## 2023-12-22 NOTE — Assessment & Plan Note (Signed)
Refer to Psych of pt's choice

## 2023-12-22 NOTE — Assessment & Plan Note (Signed)
Hyperlipidemia:Low fat diet discussed and encouraged.   Lipid Panel  Lab Results  Component Value Date   CHOL 204 (H) 07/01/2023   HDL 66 07/01/2023   LDLCALC 94 07/01/2023   TRIG 268 (H) 07/01/2023   CHOLHDL 3.1 07/01/2023     Updated lab needed at/ before next visit. Not at goal

## 2023-12-22 NOTE — Progress Notes (Signed)
Jane English     MRN: [id number]      DOB: 1970-11-19  Chief Complaint  Patient presents with   Bronchitis    Patient reports went away and came back     HPI Jane English is here for follow up of visit on 2/19 when she was diagnosed with bronchitis and had short course of prednisone and doxycycline prescribed . Feels as though she is getting worse so is here for re evaluation. Of note was treated on 12/12 /2024 for bronchiris , states she improved , then her symptoms again worsened C/o head and chest congestion , excessive coughingand shortness of breath, fatigue Requests referral to a specific Psychiatrist at [facility] who another family member also sees , not sure if this is feasible but redferral is entered  ROS . Denies chest pains, palpitations and leg swelling Denies abdominal pain, nausea, vomiting,diarrhea or constipation.   Denies dysuria, frequency, hesitancy or incontinence. Denies joint pain, swelling and limitation in mobility. Denies headaches, seizures, numbness, or tingling. C/o  depression, wants to be treated by Psychiatry, not suicidal or homicidal, also c/o anxiety  Denies skin break down or rash.   PE  BP 117/78 (BP Location: Right Arm, Patient Position: Sitting, Cuff Size: Normal)   Pulse 75   Ht 5\' 6"  (1.676 m)   Wt 165 lb 0.6 oz (74.9 kg)   SpO2 96%   BMI 26.64 kg/m   Patient alert and oriented  HEENT: No facial asymmetry, EOMI,     Neck supple .positive nasal congestion, no sinus tenderness, no exudate or erythema of oropharynx  Chest: Adequate air entry , scattered wheezes , no crackles, no bronchial breath sounds CVS: S1, S2 no murmurs, no S3.Regular rate.  ABD: Soft non tender.   Ext: No edema  MS: Adequate ROM spine, shoulders, hips and knees.  Skin: Intact, no ulcerations or rash noted.  Psych: Good eye contact, normal affect. Memory intact mildly  anxious not depressed appearing.  CNS: CN 2-12 intact, power,  normal throughout.no focal  deficits noted.   Assessment & Plan Bronchitis, acute Rcurrent , pt to send in sputum c/s , and get cXR today stat also labs, she is to continue course of doxycyline and prednisone that she was started on 2 days ago  Atypical squamous cell changes of undetermined significance (ASCUS) on cervical cytology with negative high risk human papilloma virus (HPV) test result Refer to Gyne, pt aware   Onychomycosis One great toe nail affected, terbinafine x 6 weeks only, hepatic panel is normal  Depression, major, single episode, moderate (HCC) Refer to Psych of pt's choice  Acute sinusitis Sinus Xray ordered stat , shows  no infection in sinuses  GAD (generalized anxiety disorder) Increased  anxiety reported and requests Psych management , referral entered  Hyperlipidemia LDL goal <100 Hyperlipidemia:Low fat diet discussed and encouraged.   Lipid Panel  Lab Results  Component Value Date   CHOL 204 (H) 07/01/2023   HDL 66 07/01/2023   LDLCALC 94 07/01/2023   TRIG 268 (H) 07/01/2023   CHOLHDL 3.1 07/01/2023     Updated lab needed at/ before next visit. Not at goal  Type 2 diabetes mellitus with other specified complication (HCC) Diabetes associated with hypertension, hyperlipidemia, and depression  Jane English is reminded of the importance of commitment to daily physical activity for 30 minutes or more, as able and the need to limit carbohydrate intake to 30 to 60 grams per meal to help with blood sugar  control.   The need to take medication as prescribed, test blood sugar as directed, and to call between visits if there is a concern that blood sugar is uncontrolled is also discussed.   Jane English is reminded of the importance of daily foot exam, annual eye examination, and good blood sugar, blood pressure and cholesterol control. Improving, no med change     Latest Ref Rng & Units 12/17/2023   10:36 AM 07/01/2023    9:28 AM 03/12/2023   10:57 AM 09/23/2022    8:36 AM  09/20/2022    9:53 AM  Diabetic Labs  HbA1c 4.8 - 5.6 % 5.9  6.0  5.9  5.9    Micro/Creat Ratio 0 - 29 mg/g creat 5     3   Chol 100 - 199 mg/dL  604  540  981    HDL >19 mg/dL  66  62  58    Calc LDL 0 - 99 mg/dL  94  147  88    Triglycerides 0 - 149 mg/dL  829  71  562    Creatinine 0.57 - 1.00 mg/dL 1.30  8.65  7.84  6.96        12/17/2023    9:38 AM 12/15/2023    3:11 PM 11/20/2023    2:46 PM 11/11/2023    7:53 AM 10/20/2023    9:00 AM 09/22/2023    2:39 PM 07/01/2023    8:35 AM  BP/Weight  Systolic BP 117  295 -- 111 122 112  Diastolic BP 78  72 -- 74 72 72  Wt. (Lbs) 165.04 161.2 163 164 162 168 172.12  BMI 26.64 kg/m2 26.02 kg/m2 26.31 kg/m2 26.47 kg/m2 26.15 kg/m2 27.12 kg/m2 27.78 kg/m2      Latest Ref Rng & Units 12/17/2023    9:40 AM 07/10/2023   12:00 AM  Foot/eye exam completion dates  Eye Exam No Retinopathy  No Retinopathy      Foot Form Completion  Done      This result is from an external source.        Essential hypertension DASH diet and commitment to daily physical activity for a minimum of 30 minutes discussed and encouraged, as a part of hypertension management. The importance of attaining a healthy weight is also discussed.     12/17/2023    9:38 AM 12/15/2023    3:11 PM 11/20/2023    2:46 PM 11/11/2023    7:53 AM 10/20/2023    9:00 AM 09/22/2023    2:39 PM 07/01/2023    8:35 AM  BP/Weight  Systolic BP 117  284 -- 111 122 112  Diastolic BP 78  72 -- 74 72 72  Wt. (Lbs) 165.04 161.2 163 164 162 168 172.12  BMI 26.64 kg/m2 26.02 kg/m2 26.31 kg/m2 26.47 kg/m2 26.15 kg/m2 27.12 kg/m2 27.78 kg/m2     Controlled, no change in medication

## 2023-12-22 NOTE — ED Triage Notes (Signed)
Pt is here due to URI which has been going on since 2/4.  Pt has had cough, cold, congestion, body aches, headache.

## 2023-12-22 NOTE — Assessment & Plan Note (Signed)
One great toe nail affected, terbinafine x 6 weeks only, hepatic panel is normal

## 2023-12-22 NOTE — Assessment & Plan Note (Signed)
Diabetes associated with hypertension, hyperlipidemia, and depression  Jane English is reminded of the importance of commitment to daily physical activity for 30 minutes or more, as able and the need to limit carbohydrate intake to 30 to 60 grams per meal to help with blood sugar control.   The need to take medication as prescribed, test blood sugar as directed, and to call between visits if there is a concern that blood sugar is uncontrolled is also discussed.   Jane English is reminded of the importance of daily foot exam, annual eye examination, and good blood sugar, blood pressure and cholesterol control. Improving, no med change     Latest Ref Rng & Units 12/17/2023   10:36 AM 07/01/2023    9:28 AM 03/12/2023   10:57 AM 09/23/2022    8:36 AM 09/20/2022    9:53 AM  Diabetic Labs  HbA1c 4.8 - 5.6 % 5.9  6.0  5.9  5.9    Micro/Creat Ratio 0 - 29 mg/g creat 5     3   Chol 100 - 199 mg/dL  161  096  045    HDL >40 mg/dL  66  62  58    Calc LDL 0 - 99 mg/dL  94  981  88    Triglycerides 0 - 149 mg/dL  191  71  478    Creatinine 0.57 - 1.00 mg/dL 2.95  6.21  3.08  6.57        12/17/2023    9:38 AM 12/15/2023    3:11 PM 11/20/2023    2:46 PM 11/11/2023    7:53 AM 10/20/2023    9:00 AM 09/22/2023    2:39 PM 07/01/2023    8:35 AM  BP/Weight  Systolic BP 117  846 -- 111 122 112  Diastolic BP 78  72 -- 74 72 72  Wt. (Lbs) 165.04 161.2 163 164 162 168 172.12  BMI 26.64 kg/m2 26.02 kg/m2 26.31 kg/m2 26.47 kg/m2 26.15 kg/m2 27.12 kg/m2 27.78 kg/m2      Latest Ref Rng & Units 12/17/2023    9:40 AM 07/10/2023   12:00 AM  Foot/eye exam completion dates  Eye Exam No Retinopathy  No Retinopathy      Foot Form Completion  Done      This result is from an external source.

## 2023-12-22 NOTE — ED Provider Notes (Signed)
 [place] EMERGENCY DEPARTMENT AT [facility] Provider Note   CSN: [id number] Arrival date & time: 12/22/23  1412     History Chief Complaint  Patient presents with   URI    Jane English is a 53 y.o. female.  Patient past history significant for hypertension, allergic rhinitis, cough variant asthma, bronchitis presents emergency department concerns of URI. States that she was seen by PCP multiple times since symptoms began about 2 weeks ago and has been prednisone, azithromycin, and doxycycline without improvement. Coughing worsens at night. No notable leg swelling. Feelings of shortness of breath worsen when congestion worsens in her sinuses. Denies any facial pressure. No history of PE/DVT.   URI Presenting symptoms: cough        Home Medications Prior to Admission medications   Medication Sig Start Date End Date Taking? Authorizing Provider  predniSONE (STERAPRED UNI-PAK 21 TAB) 10 MG (21) TBPK tablet Take 6 tablets (60 mg total) by mouth daily for 2 days, THEN 5 tablets (50 mg total) daily for 2 days, THEN 4 tablets (40 mg total) daily for 2 days, THEN 3 tablets (30 mg total) daily for 2 days, THEN 2 tablets (20 mg total) daily for 2 days, THEN 1 tablet (10 mg total) daily for 2 days. 12/22/23 01/03/24 Yes Smitty Knudsen, PA-C  ACCU-CHEK GUIDE test strip USE AS INSTRUCTED ONCE DAILY DX E11.9 06/30/23   Kerri Perches, MD  albuterol (PROVENTIL) (2.5 MG/3ML) 0.083% nebulizer solution Take 3 mLs (2.5 mg total) by nebulization every 6 (six) hours as needed for wheezing or shortness of breath. 07/01/23 12/28/23  Kerri Perches, MD  albuterol (VENTOLIN HFA) 108 (90 Base) MCG/ACT inhaler TAKE 2 PUFFS BY MOUTH EVERY 6 HOURS AS NEEDED FOR WHEEZE OR SHORTNESS OF BREATH 07/01/23   Kerri Perches, MD  amitriptyline (ELAVIL) 10 MG tablet TAKE 1 TABLET BY MOUTH EVERYDAY AT BEDTIME 12/11/23   Raulkar, Drema Pry, MD  beclomethasone (QVAR REDIHALER) 40 MCG/ACT inhaler Inhale 2  puffs into the lungs 2 (two) times daily. 12/14/19   Kerri Perches, MD  benzonatate (TESSALON) 100 MG capsule Take 1 capsule (100 mg total) by mouth 2 (two) times daily as needed for cough. 10/16/23   Kerri Perches, MD  benzoyl peroxide 5 % external liquid Apply topically 2 (two) times daily. 04/07/23   Del Nigel Berthold, FNP  blood glucose meter kit and supplies Dispense based on patient and insurance preference. Once daily testing DX E11.9 04/09/22   Kerri Perches, MD  BREO ELLIPTA 100-25 MCG/ACT AEPB INHALE 1 PUFF INTO THE LUNGS TWICE A DAY 11/26/21   Kerri Perches, MD  cloNIDine (CATAPRES) 0.1 MG tablet TAKE 1 TABLET BY MOUTH EVERYDAY AT BEDTIME 05/30/23   Kerri Perches, MD  clotrimazole-betamethasone (LOTRISONE) cream APPLY TWICE DAILY FOR 5 DAYS TO AFFECTED AREA(S) THEN AS NEEDED 11/10/23   Kerri Perches, MD  cromolyn (OPTICROM) 4 % ophthalmic solution Place 1 drop into both eyes 4 (four) times daily. 07/01/23   Kerri Perches, MD  cyclobenzaprine (FLEXERIL) 5 MG tablet Take 1 tablet (5 mg total) by mouth 3 (three) times daily as needed for muscle spasms. 03/12/21   Raulkar, Drema Pry, MD  cycloSPORINE (RESTASIS) 0.05 % ophthalmic emulsion Place 1 drop into both eyes 2 (two) times daily. 10/23/23 01/21/24  Kerri Perches, MD  diclofenac Sodium (VOLTAREN) 1 % GEL APPLY 2 GRAMS TO AFFECTED AREA 4 TIMES A DAY 01/05/21   Raulkar,  Drema Pry, MD  EPINEPHrine 0.3 mg/0.3 mL IJ SOAJ injection as needed. 06/23/20   [provider]  ergocalciferol (VITAMIN D2) 1.25 MG (50000 UT) capsule Take 1 capsule (50,000 Units total) by mouth once a week. One capsule once weekly 03/26/22   Kerri Perches, MD  Fluocinolone Acetonide Body 0.01 % OIL Apply 1 Application topically every other day. 07/01/23 12/28/23  Kerri Perches, MD  fluticasone (FLONASE) 50 MCG/ACT nasal spray PLACE 1-2 SPRAYS INTO BOTH NOSTRILS DAILY. 06/20/23   Kerri Perches, MD  hydrOXYzine  (VISTARIL) 25 MG capsule TAKE 1 CAPSULE BY MOUTH THREE TIMES A DAY 09/04/23   Kerri Perches, MD  ipratropium (ATROVENT) 0.03 % nasal spray Place 2 sprays into both nostrils every 12 (twelve) hours. 10/04/21   Freddy Finner, NP  Lancets 30G MISC Once daily testing dx e11.9 04/09/22   Kerri Perches, MD  lansoprazole (PREVACID) 30 MG capsule Take 1 capsule (30 mg total) by mouth daily. 12/31/22   Pyrtle, Carie Caddy, MD  lidocaine (LIDODERM) 5 % Place 1 patch onto the skin daily. Remove & Discard patch within 12 hours or as directed by MD 07/03/23   Carlis Abbott, Drema Pry, MD  lidocaine (XYLOCAINE) 2 % jelly  08/31/20   [provider]  linaclotide (LINZESS) 290 MCG CAPS capsule TAKE 1 CAPSULE (290 MCG TOTAL) BY MOUTH DAILY BEFORE BREAKFAST. 05/22/22   Pyrtle, Carie Caddy, MD  loratadine (CLARITIN) 10 MG tablet TAKE 1 TABLET BY MOUTH EVERY DAY 07/22/23   Kerri Perches, MD  lubiprostone (AMITIZA) 24 MCG capsule TAKE 1 CAPSULE (24 MCG TOTAL) BY MOUTH 2 (TWO) TIMES DAILY WITH A MEAL. 10/23/23   Pyrtle, Carie Caddy, MD  magnesium oxide (MAG-OX) 400 MG tablet Take 1 tablet (400 mg total) by mouth 2 (two) times daily. 11/07/22   Raulkar, Drema Pry, MD  meloxicam (MOBIC) 7.5 MG tablet TAKE 1 TABLET BY MOUTH 2 TIMES DAILY AS NEEDED FOR PAIN. 09/29/23   Raulkar, Drema Pry, MD  methocarbamol (ROBAXIN) 500 MG tablet Take 1 tablet (500 mg total) by mouth 4 (four) times daily. 03/19/22   Raulkar, Drema Pry, MD  montelukast (SINGULAIR) 10 MG tablet TAKE 1 TABLET BY MOUTH EVERYDAY AT BEDTIME 07/02/23   Kerri Perches, MD  Na Sulfate-K Sulfate-Mg Sulf 17.5-3.13-1.6 GM/177ML SOLN Take as directed 01/08/23   Pyrtle, Carie Caddy, MD  [facility] 4 MG/0.1ML LIQD nasal spray kit Place 4 mg into the nose as directed. 03/10/18   [provider]  NONFORMULARY OR COMPOUNDED ITEM Apply 1-2 pumps, 3-4 times daily as needed. 01/20/20   Pollyann Savoy, MD  ondansetron (ZOFRAN) 4 MG tablet Take 1 tablet (4 mg total) by mouth every 8  (eight) hours as needed for nausea or vomiting. 05/02/22   Raulkar, Drema Pry, MD  oxybutynin (DITROPAN-XL) 10 MG 24 hr tablet TAKE 1 TABLET BY MOUTH EVERYDAY AT BEDTIME 02/27/22   Kerri Perches, MD  oxyCODONE (ROXICODONE) 5 MG immediate release tablet Take 2 tablets (10 mg total) by mouth 2 (two) times daily. 11/20/23   Jones Bales, NP  pantoprazole (PROTONIX) 40 MG tablet Take 1 tablet (40 mg total) by mouth daily. 05/23/22   Pyrtle, Carie Caddy, MD  PARoxetine (PAXIL) 10 MG tablet TAKE 1 TABLET BY MOUTH EVERY DAY 09/04/23   Kerri Perches, MD  potassium chloride 20 MEQ/15ML (10%) SOLN Take 15 mLs (20 mEq total) by mouth 2 (two) times daily. 05/10/22   Kerri Perches, MD  promethazine-dextromethorphan (PROMETHAZINE-DM) 6.25-15 MG/5ML syrup Take one teaspoon by mouth at bedtime, as needed, for excessive cough 12/15/23   Gilmore Laroche, FNP  rosuvastatin (CRESTOR) 5 MG tablet TAKE 1 TABLET (5 MG TOTAL) BY MOUTH DAILY. 12/03/23   Kerri Perches, MD  Safety Seal Miscellaneous MISC Apply 1 Application topically daily. Medication Name: AA Gel, For first week use Every other day, If tolerated Well, increase to daily use 05/21/23   Terri Piedra, DO  Safety Seal Miscellaneous MISC 1 Application by Does not apply route in the morning. MEDICATION NAME: AA gel 09/18/23   Terri Piedra, DO  solifenacin (VESICARE) 5 MG tablet TAKE 1 TABLET (5 MG TOTAL) BY MOUTH DAILY. 12/03/23   Kerri Perches, MD  terbinafine (LAMISIL) 250 MG tablet Take 1 tablet (250 mg total) by mouth daily. 12/22/23   Kerri Perches, MD  tirzepatide [facility]) 2.5 MG/0.5ML Pen INJECT 2.5 MG SUBCUTANEOUSLY WEEKLY 10/08/23   Kerri Perches, MD  tirzepatide [facility]) 5 MG/0.5ML Pen Inject 5 mg into the skin once a week. 07/30/23   Kerri Perches, MD  topiramate (TOPAMAX) 25 MG tablet TAKE 1 TABLET BY MOUTH TWICE A DAY 12/11/23   Raulkar, Drema Pry, MD  trolamine salicylate (ASPERCREME) 10 % cream Apply 1  application topically as needed for muscle pain.     [provider]      Allergies    Neurontin [gabapentin], Penicillins, Pregabalin, Tramadol, and Latex    Review of Systems   Review of Systems  Respiratory:  Positive for cough and shortness of breath.   All other systems reviewed and are negative.   Physical Exam Updated Vital Signs BP (!) 151/102   Pulse 83   Temp 98.5 F (36.9 C) (Oral)   Resp 18   SpO2 100%  Physical Exam Vitals and nursing note reviewed.  Constitutional:      General: She is not in acute distress.    Appearance: She is well-developed.  HENT:     Head: Normocephalic and atraumatic.     Mouth/Throat:     Comments: No oropharyngeal erythema or tonsillar exudate. No uvular deviation. Eyes:     Conjunctiva/sclera: Conjunctivae normal.  Cardiovascular:     Rate and Rhythm: Normal rate and regular rhythm.     Heart sounds: No murmur heard. Pulmonary:     Effort: Pulmonary effort is normal. No respiratory distress.     Breath sounds: Normal breath sounds. No wheezing, rhonchi or rales.  Abdominal:     Palpations: Abdomen is soft.     Tenderness: There is no abdominal tenderness.  Musculoskeletal:        General: No swelling.     Cervical back: Neck supple.  Skin:    General: Skin is warm and dry.     Capillary Refill: Capillary refill takes less than 2 seconds.  Neurological:     Mental Status: She is alert.  Psychiatric:        Mood and Affect: Mood normal.     ED Results / Procedures / Treatments   Labs (all labs ordered are listed, but only abnormal results are displayed) Labs Reviewed  CBC - Abnormal; Notable for the following components:      Result Value   RBC 3.71 (*)    Hemoglobin 11.9 (*)    HCT 34.9 (*)    All other components within normal limits  BASIC METABOLIC PANEL - Abnormal; Notable for the following components:   Potassium 3.1 (*)  Glucose, Bld 114 (*)    All other components within normal limits  RESP  PANEL BY RT-PCR (RSV, FLU A&B, COVID)  [id number]    EKG None  Radiology No results found.   Procedures Procedures    Medications Ordered in ED Medications  albuterol (PROVENTIL) (2.5 MG/3ML) 0.083% nebulizer solution 2.5 mg (2.5 mg Nebulization Given 12/22/23 1827)  ipratropium-albuterol (DUONEB) 0.5-2.5 (3) MG/3ML nebulizer solution 3 mL (3 mLs Nebulization Given 12/22/23 1830)  iohexol (OMNIPAQUE) 350 MG/ML injection 100 mL (75 mLs Intravenous Contrast Given 12/22/23 2055)  predniSONE (DELTASONE) tablet 60 mg (60 mg Oral Given 12/22/23 2151)    ED Course/ Medical Decision Making/ A&P                                 Medical Decision Making Amount and/or Complexity of Data Reviewed Labs: ordered. Radiology: ordered.  Risk Prescription drug management.   This patient presents to the ED for concern of URI. Differential diagnosis includes COVID-19, influenza, URI, pneumonia   Lab Tests:  I Ordered, and personally interpreted labs.  The pertinent results include: CBC with some notable anemia which patient reports is her baseline, BMP with mild hypokalemia 3.1 but no evidence of renal dysfunction, respiratory panel negative   Imaging Studies ordered:  I ordered imaging studies including chest x-ray I independently visualized and interpreted imaging which showed no acute cardiopulmonary process seen I agree with the radiologist interpretation   Medicines ordered and prescription drug management:  I ordered medication including albuterol, DuoNeb for cough, congestion Reevaluation of the patient after these medicines showed that the patient improved I have reviewed the patients home medicines and have made adjustments as needed   Problem List / ED Course:  Patient presents to the ED with concerns of URI. Past history of bronchitis, cough variant asthma, allergies, and hypertension. She reports initial symptom onset around 12/09/2023 and has had no improvement since then. Was  seen by PCP several times and started on azithromycin, doxycycline, and prednisone on two different occasions without improvement in symptoms. She is concerned that her symptoms are not improving over the last two weeks. Primarily endorses cough with some production as well as chest discomfort primarily with coughing. Physical exam reveals a horse voice but no abnormal lung or heart sounds. No significant posterior erythema or exudate present. Patient has low risk factors for PE and not actively experience hemoptysis, leg swelling, shortness of breath, or history of prior PE/DVT. However, age is >50 and cannot exclude with PERC.  Labs unremarkable. Mild hypokalemia. Baseline anemia per patient's report. Negative for COVID, influenza, and RSV. Chest xray is unremarkable. Given protracted course of symptoms, will about CT angio to rule out possible PE and fully exclude pneumonia. CT angio negative for PE, pneumonia, or any other abnormality. Suspect at this time patient is likely experiencing a prolonged episode of bronchitis with possible post-viral cough syndrome. After discussion, decided to prolong course of steroids. Discussed strict return precautions. Patient otherwise stable and discharged home.  Final Clinical Impression(s) / ED Diagnoses Final diagnoses:  Bronchitis  Hypokalemia  Hypertension, unspecified type    Rx / DC Orders ED Discharge Orders          Ordered    predniSONE (STERAPRED UNI-PAK 21 TAB) 10 MG (21) TBPK tablet  Daily        12/22/23 2145  Smitty Knudsen, PA-C 12/25/23 1054    Gwyneth Sprout, MD 12/26/23 [phone]

## 2023-12-22 NOTE — ED Notes (Signed)
RT assessed she Pt in the room and her lungs sounded clear. The Pt wants a breathing tx. And RT ordered a Tx

## 2023-12-22 NOTE — Assessment & Plan Note (Signed)
DASH diet and commitment to daily physical activity for a minimum of 30 minutes discussed and encouraged, as a part of hypertension management. The importance of attaining a healthy weight is also discussed.     12/17/2023    9:38 AM 12/15/2023    3:11 PM 11/20/2023    2:46 PM 11/11/2023    7:53 AM 10/20/2023    9:00 AM 09/22/2023    2:39 PM 07/01/2023    8:35 AM  BP/Weight  Systolic BP 117  621 -- 111 122 112  Diastolic BP 78  72 -- 74 72 72  Wt. (Lbs) 165.04 161.2 163 164 162 168 172.12  BMI 26.64 kg/m2 26.02 kg/m2 26.31 kg/m2 26.47 kg/m2 26.15 kg/m2 27.12 kg/m2 27.78 kg/m2     Controlled, no change in medication

## 2023-12-22 NOTE — ED Notes (Signed)
RT assessed the Pt. Her O2 was 99% and HR 99. Pt said her doctor told her to come because she may have the flu, covid or rsv

## 2023-12-22 NOTE — Assessment & Plan Note (Signed)
Sinus Xray ordered stat , shows  no infection in sinuses

## 2023-12-22 NOTE — Discharge Instructions (Signed)
You are seen in the emergency department today for concerns of URI type symptoms.  Your labs were thankfully negative with no signs of COVID-19, pneumonia, or RSV.  He had a chest x-ray and a CT scan of your chest also performed which were thankfully negative with no signs of pneumonia, and pulmonary embolism, or any signs of fluid on your heart.  This appears to be bronchitis with prolonged course of steroids.  You may pick up this medication and start taking it tomorrow.  You are given initial dose here in the emergency department prior to being discharged home.  For any new or worsening symptoms return to the emergency department.  Otherwise I would recommend following up with your primary care provider.

## 2023-12-22 NOTE — Assessment & Plan Note (Signed)
Refer to [facility], pt aware

## 2023-12-23 ENCOUNTER — Telehealth: Payer: Self-pay

## 2023-12-23 ENCOUNTER — Ambulatory Visit: Payer: Self-pay | Admitting: Family Medicine

## 2023-12-23 NOTE — Transitions of Care (Post Inpatient/ED Visit) (Unsigned)
   12/23/2023  Name: Jane English MRN: [id number] DOB: 15-Aug-1971  Today's TOC FU Call Status: Today's TOC FU Call Status:: Unsuccessful Call (1st Attempt) Unsuccessful Call (1st Attempt) Date: 12/23/23  Attempted to reach the patient regarding the most recent Inpatient/ED visit.  Follow Up Plan: Additional outreach attempts will be made to reach the patient to complete the Transitions of Care (Post Inpatient/ED visit) call.   Signature Karena Addison, LPN [facility] Nurse Health Advisor Direct Dial [phone]

## 2023-12-24 NOTE — Transitions of Care (Post Inpatient/ED Visit) (Signed)
12/24/2023  Name: Jane English MRN: [id number] DOB: Dec 12, 1970  Today's TOC FU Call Status: Today's TOC FU Call Status:: Successful TOC FU Call Completed Unsuccessful Call (1st Attempt) Date: 12/23/23 [facility] FU Call Complete Date: 12/24/23 [name] Name and Date of Birth confirmed.  Transition Care Management Follow-up Telephone Call Date of Discharge: 12/22/23 Discharge Facility: [facility] ([facility]) Type of Discharge: Emergency Department Reason for ED Visit: Other: (bronchitis) How have you been since you were released from the hospital?: Better Any questions or concerns?: No  Items Reviewed: Did you receive and understand the discharge instructions provided?: Yes Medications obtained,verified, and reconciled?: Yes (Medications Reviewed) Any new allergies since your discharge?: No Dietary orders reviewed?: Yes Do you have support at home?: No  Medications Reviewed Today: Medications Reviewed Today     Reviewed by Karena Addison, LPN (Licensed Practical Nurse) on 12/24/23 at 1323  Med List Status: <None>   Medication Order Taking? Sig Documenting Provider Last Dose Status Informant  ACCU-CHEK GUIDE test strip [id number] No USE AS INSTRUCTED ONCE DAILY DX E11.9 Kerri Perches, MD Taking Active   albuterol (PROVENTIL) (2.5 MG/3ML) 0.083% nebulizer solution [id number] No Take 3 mLs (2.5 mg total) by nebulization every 6 (six) hours as needed for wheezing or shortness of breath. Kerri Perches, MD Taking Active   albuterol (VENTOLIN HFA) 108 [phone] Base) MCG/ACT inhaler [id number] No TAKE 2 PUFFS BY MOUTH EVERY 6 HOURS AS NEEDED FOR WHEEZE OR SHORTNESS OF BREATH Kerri Perches, MD Taking Active   amitriptyline (ELAVIL) 10 MG tablet [id number] No TAKE 1 TABLET BY MOUTH EVERYDAY AT BEDTIME Raulkar, Drema Pry, MD Taking Active   beclomethasone (QVAR REDIHALER) 40 MCG/ACT inhaler [id number] No Inhale 2 puffs into the lungs 2 (two) times daily. Kerri Perches, MD  Taking Active   benzonatate (TESSALON) 100 MG capsule [id number] No Take 1 capsule (100 mg total) by mouth 2 (two) times daily as needed for cough. Kerri Perches, MD Taking Active   benzoyl peroxide 5 % external liquid [id number] No Apply topically 2 (two) times daily. Del Nigel Berthold, FNP Taking Active   blood glucose meter kit and supplies [id number] No Dispense based on patient and insurance preference. Once daily testing DX E11.9 Kerri Perches, MD Taking Active   BREO ELLIPTA 100-25 MCG/ACT AEPB [id number] No INHALE 1 PUFF INTO THE LUNGS TWICE A DAY Kerri Perches, MD Taking Active   cloNIDine (CATAPRES) 0.1 MG tablet [id number] No TAKE 1 TABLET BY MOUTH EVERYDAY AT BEDTIME Kerri Perches, MD Taking Active   clotrimazole-betamethasone (LOTRISONE) cream [id number] No APPLY TWICE DAILY FOR 5 DAYS TO AFFECTED AREA(S) THEN AS NEEDED Kerri Perches, MD Taking Active   cromolyn (OPTICROM) 4 % ophthalmic solution [id number] No Place 1 drop into both eyes 4 (four) times daily. Kerri Perches, MD Taking Active   cyclobenzaprine (FLEXERIL) 5 MG tablet [id number] No Take 1 tablet (5 mg total) by mouth 3 (three) times daily as needed for muscle spasms. Horton Chin, MD Taking Active   cycloSPORINE (RESTASIS) 0.05 % ophthalmic emulsion [id number] No Place 1 drop into both eyes 2 (two) times daily. Kerri Perches, MD Taking Active   diclofenac Sodium (VOLTAREN) 1 % GEL [id number] No APPLY 2 GRAMS TO AFFECTED AREA 4 TIMES A DAY Raulkar, Drema Pry, MD Taking Active            Med Note Harriet Pho   Fri Sep 20, 2022  9:02 AM) As needed  EPINEPHrine  0.3 mg/0.3 mL IJ SOAJ injection [id number] No as needed. [provider] Taking Active   ergocalciferol (VITAMIN D2) 1.25 MG (50000 UT) capsule [id number] No Take 1 capsule (50,000 Units total) by mouth once a week. One capsule once weekly Kerri Perches, MD Taking Active   Fluocinolone Acetonide Body 0.01 % OIL  [id number] No Apply 1 Application topically every other day. Kerri Perches, MD Taking Active   fluticasone [facility]) 50 MCG/ACT nasal spray [id number] No PLACE 1-2 SPRAYS INTO BOTH NOSTRILS DAILY. Kerri Perches, MD Taking Active   hydrOXYzine (VISTARIL) 25 MG capsule [id number] No TAKE 1 CAPSULE BY MOUTH THREE TIMES A DAY Kerri Perches, MD Taking Active   ipratropium (ATROVENT) 0.03 % nasal spray [id number] No Place 2 sprays into both nostrils every 12 (twelve) hours. Freddy Finner, NP Taking Active   Lancets 30G MISC [id number] No Once daily testing dx e11.9 Kerri Perches, MD Taking Active   lansoprazole (PREVACID) 30 MG capsule [id number] No Take 1 capsule (30 mg total) by mouth daily. Beverley Fiedler, MD Taking Active   lidocaine (LIDODERM) 5 % [id number] No Place 1 patch onto the skin daily. Remove & Discard patch within 12 hours or as directed by MD Raulkar, Drema Pry, MD Taking Active   lidocaine (XYLOCAINE) 2 % jelly [id number] No  [provider] Taking Active   linaclotide (LINZESS) 290 MCG CAPS capsule [id number] No TAKE 1 CAPSULE (290 MCG TOTAL) BY MOUTH DAILY BEFORE BREAKFAST. Beverley Fiedler, MD Taking Active   loratadine (CLARITIN) 10 MG tablet [id number] No TAKE 1 TABLET BY MOUTH EVERY DAY Kerri Perches, MD Taking Active   lubiprostone (AMITIZA) 24 MCG capsule [id number] No TAKE 1 CAPSULE (24 MCG TOTAL) BY MOUTH 2 (TWO) TIMES DAILY WITH A MEAL. Beverley Fiedler, MD Taking Active   magnesium oxide (MAG-OX) 400 MG tablet [id number] No Take 1 tablet (400 mg total) by mouth 2 (two) times daily. Horton Chin, MD Taking Active   meloxicam (MOBIC) 7.5 MG tablet [id number] No TAKE 1 TABLET BY MOUTH 2 TIMES DAILY AS NEEDED FOR PAIN. Horton Chin, MD Taking Active   methocarbamol (ROBAXIN) 500 MG tablet [id number] No Take 1 tablet (500 mg total) by mouth 4 (four) times daily. Horton Chin, MD Taking Active   montelukast (SINGULAIR) 10 MG tablet [id number] No  TAKE 1 TABLET BY MOUTH EVERYDAY AT BEDTIME Kerri Perches, MD Taking Active   Na Sulfate-K Sulfate-Mg Sulf 17.5-3.13-1.6 GM/177ML SOLN [id number] No Take as directed Pyrtle, Carie Caddy, MD Taking Active   NARCAN 4 MG/0.1ML LIQD nasal spray kit [id number] No Place 4 mg into the nose as directed. [provider] Taking Active            Med Note Antony Madura, Harlin Rain Feb 04, 2019  3:57 PM) Patient stated that she does have this, but hasn't ever had to use (it)  NONFORMULARY OR COMPOUNDED ITEM [id number] No Apply 1-2 pumps, 3-4 times daily as needed. Pollyann Savoy, MD Taking Active   ondansetron (ZOFRAN) 4 MG tablet [id number] No Take 1 tablet (4 mg total) by mouth every 8 (eight) hours as needed for nausea or vomiting. Horton Chin, MD Taking Active   oxybutynin (DITROPAN-XL) 10 MG 24 hr tablet [id number] No TAKE 1 TABLET BY MOUTH EVERYDAY AT BEDTIME Kerri Perches, MD Taking Active   oxyCODONE (ROXICODONE) 5 MG immediate release tablet [id number] No Take 2 tablets (10 mg total) by mouth  2 (two) times daily. Jones Bales, NP Taking Active   pantoprazole (PROTONIX) 40 MG tablet [id number] No Take 1 tablet (40 mg total) by mouth daily. Beverley Fiedler, MD Taking Active   PARoxetine (PAXIL) 10 MG tablet [id number] No TAKE 1 TABLET BY MOUTH EVERY DAY Kerri Perches, MD Taking Active   potassium chloride 20 MEQ/15ML (10%) SOLN [id number] No Take 15 mLs (20 mEq total) by mouth 2 (two) times daily. Kerri Perches, MD Taking Active   predniSONE (STERAPRED UNI-PAK 21 TAB) 10 MG (21) TBPK tablet [id number]  Take 6 tablets (60 mg total) by mouth daily for 2 days, THEN 5 tablets (50 mg total) daily for 2 days, THEN 4 tablets (40 mg total) daily for 2 days, THEN 3 tablets (30 mg total) daily for 2 days, THEN 2 tablets (20 mg total) daily for 2 days, THEN 1 tablet (10 mg total) daily for 2 days. Smitty Knudsen, PA-C  Active   promethazine-dextromethorphan (PROMETHAZINE-DM) 6.25-15 MG/5ML syrup  [id number] No Take one teaspoon by mouth at bedtime, as needed, for excessive cough Gilmore Laroche, FNP Taking Active   rosuvastatin (CRESTOR) 5 MG tablet [id number] No TAKE 1 TABLET (5 MG TOTAL) BY MOUTH DAILY. Kerri Perches, MD Taking Active   Safety Seal Miscellaneous MISC [id number] No Apply 1 Application topically daily. Medication Name: AA Gel, For first week use Every other day, If tolerated Well, increase to daily use Terri Piedra, DO Taking Active   Safety Seal Miscellaneous MISC [id number] No 1 Application by Does not apply route in the morning. MEDICATION NAME: AA gel Terri Piedra, DO Taking Active   solifenacin (VESICARE) 5 MG tablet [id number] No TAKE 1 TABLET (5 MG TOTAL) BY MOUTH DAILY. Kerri Perches, MD Taking Active   terbinafine (LAMISIL) 250 MG tablet [id number]  Take 1 tablet (250 mg total) by mouth daily. Kerri Perches, MD  Active   tirzepatide [facility]) 2.5 MG/0.5ML Pen [id number] No INJECT 2.5 MG SUBCUTANEOUSLY WEEKLY Kerri Perches, MD Taking Active   tirzepatide [facility]) 5 MG/0.5ML Pen [id number] No Inject 5 mg into the skin once a week. Kerri Perches, MD Taking Active   topiramate (TOPAMAX) 25 MG tablet [id number] No TAKE 1 TABLET BY MOUTH TWICE A DAY Raulkar, Drema Pry, MD Taking Active   trolamine salicylate (ASPERCREME) 10 % cream [id number] No Apply 1 application topically as needed for muscle pain.  [provider] Taking Active Self  Med List Note Martie Round, [facility] 10/10/21 1519):               Home Care and Equipment/Supplies: Were Home Health Services Ordered?: NA Any new equipment or medical supplies ordered?: NA  Functional Questionnaire: Do you need assistance with bathing/showering or dressing?: No Do you need assistance with meal preparation?: No Do you need assistance with eating?: No Do you have difficulty maintaining continence: No Do you need assistance with getting out of bed/getting out of a  chair/moving?: No Do you have difficulty managing or taking your medications?: No  Follow up appointments reviewed: PCP Follow-up appointment confirmed?: Yes Date of PCP follow-up appointment?: 01/02/24 Follow-up Provider: [facility] Follow-up appointment confirmed?: NA Do you need transportation to your follow-up appointment?: No Do you understand care options if your condition(s) worsen?: Yes-patient verbalized understanding    SIGNATURE Karena Addison, LPN [facility] Nurse Health Advisor Direct Dial [phone]

## 2023-12-29 ENCOUNTER — Encounter: Payer: Self-pay | Admitting: Family Medicine

## 2023-12-29 NOTE — Progress Notes (Unsigned)
 Subjective:    Patient ID: Jane English, female    DOB: 1971-10-27, 53 y.o.   MRN: [id number]  HPI: Jane English is a 53 y.o. female who returns for follow up appointment for chronic pain and medication refill. She states her pain is located in her bilateral shoulders, lower back pain radiating into her bilateral lower extremities and bilateral knee pain. She also reports tingling and burning in her bilateral hands. She rates her pain 10. Her current exercise regime is walking and performing stretching exercises.  Jane English Morphine equivalent is 30.00 MME.   Last UDS was Performed on 08/25/2023, it was consistent.    Pain Inventory Average Pain 7 Pain Right Now 10 My pain is aching  In the last 24 hours, has pain interfered with the following? General activity 10 Relation with others 10 Enjoyment of life 10 What TIME of day is your pain at its worst? varies Sleep (in general) Fair  Pain is worse with: walking, standing, and some activites Pain improves with: medication Relief from Meds: 7  Family History  Problem Relation Age of Onset   Physical abuse Mother    Alcohol abuse Mother    Cirrhosis Mother    Lung cancer Father    Stomach cancer Father    Esophageal cancer Father    Alcohol abuse Father    Mental illness Father    Diabetes Sister    Hypertension Sister    Bipolar disorder Sister    Schizophrenia Sister    Diabetes Sister    Drug abuse Sister    HIV Sister    Pneumonia Sister        died as a baby   Alcohol abuse Brother    Hypertension Brother    Kidney disease Brother    Diabetes Brother    Drug abuse Brother    Mental illness Brother    Alcohol abuse Brother    Alcohol abuse Brother    Hypertension Brother    Diabetes Brother    Alcohol abuse Brother    Mental illness Brother        in [place]   Alcohol abuse Brother    Alcohol abuse Brother    Bipolar disorder Brother    Hypertension Brother    Bipolar disorder Brother    Drug abuse  Brother    Alcohol abuse Brother    Bipolar disorder Brother    Bipolar disorder Daughter    Bipolar disorder Son    Bipolar disorder Son    ADD / ADHD Neg Hx    Anxiety disorder Neg Hx    Dementia Neg Hx    Depression Neg Hx    OCD Neg Hx    Seizures Neg Hx    Paranoid behavior Neg Hx    Colon cancer Neg Hx    Rectal cancer Neg Hx    Social History   Socioeconomic History   Marital status: Legally Separated    Spouse name: Molly Maduro   Number of children: 4   Years of education: 12   Highest education level: GED or equivalent  Occupational History   Occupation: disabled  Tobacco Use   Smoking status: Never    Passive exposure: Never   Smokeless tobacco: Never  Vaping Use   Vaping status: Never Used  Substance and Sexual Activity   Alcohol use: No   Drug use: No   Sexual activity: Not Currently    Partners: Male    Birth control/protection: Surgical  Comment: tubal  Other Topics Concern   Not on file  Social History Narrative   Not on file   Social Drivers of Health   Financial Resource Strain: Low Risk  (12/29/2023)   Overall Financial Resource Strain (CARDIA)    Difficulty of Paying Living Expenses: Not hard at all  Food Insecurity: Unknown (12/29/2023)   Hunger Vital Sign    Worried About Running Out of Food in the Last Year: Never true    Ran Out of Food in the Last Year: Patient declined  Transportation Needs: No Transportation Needs (12/29/2023)   PRAPARE - Administrator, Civil Service (Medical): No    Lack of Transportation (Non-Medical): No  Physical Activity: Patient Declined (11/11/2023)   Exercise Vital Sign    Days of Exercise per Week: Patient declined    Minutes of Exercise per Session: Patient declined  Stress: No Stress Concern Present (11/11/2023)   [employer] of Occupational Health - Occupational Stress Questionnaire    Feeling of Stress : Only a little  Social Connections: Socially Integrated (12/29/2023)   Social Connection  and Isolation Panel [NHANES]    Frequency of Communication with Friends and Family: More than three times a week    Frequency of Social Gatherings with Friends and Family: More than three times a week    Attends Religious Services: More than 4 times per year    Active Member of [employer] or Organizations: Yes    Attends Engineer, structural: More than 4 times per year    Marital Status: Married  Recent Concern: Social Connections - Moderately Isolated (11/11/2023)   Social Connection and Isolation Panel [NHANES]    Frequency of Communication with Friends and Family: More than three times a week    Frequency of Social Gatherings with Friends and Family: Patient declined    Attends Religious Services: Never    Database administrator or Organizations: No    Attends Engineer, structural: Never    Marital Status: Married   Past Surgical History:  Procedure Laterality Date   ANTERIOR CERVICAL DECOMP/DISCECTOMY FUSION  07/07/2012   Procedure: ANTERIOR CERVICAL DECOMPRESSION/DISCECTOMY FUSION 2 LEVELS;  Surgeon: Karn Cassis, MD;  Location: [facility];  Service: Neurosurgery;  Laterality: N/A;  Cervical four-five, five - six  Anterior cervical decompression/diskectomy/fusion/plate   APPENDECTOMY  11/04/1984   BOWEL RESECTION N/A 07/29/2013   Procedure: serosal repair;  Surgeon: Ardeth Sportsman, MD;  Location: [facility];  Service: General;  Laterality: N/A;   CARPAL TUNNEL RELEASE Bilateral    COLON SURGERY N/A    Phreesia 07/29/2020   LAPAROSCOPY N/A 07/29/2013   Procedure: diagnostic laporoscopy;  Surgeon: Ardeth Sportsman, MD;  Location: [facility];  Service: General;  Laterality: N/A;   LAPAROSCOPY N/A 08/16/2013   Procedure: LAPAROSCOPY DIAGNOSTIC/LYSIS OF ADHESIONS;  Surgeon: Ardeth Sportsman, MD;  Location: [facility];  Service: General;  Laterality: N/A;   LAPAROTOMY N/A 08/16/2013   Procedure: EXPLORATORY LAPAROTOMY/SMALL BOWEL RESECTION (JEJUNUM);  Surgeon: Ardeth Sportsman, MD;   Location: [facility];  Service: General;  Laterality: N/A;   LUMBAR SPINE SURGERY  11/04/2008   x 3   LYSIS OF ADHESION  11/04/2001   Dr. Elpidio Anis   LYSIS OF ADHESION N/A 07/29/2013   Procedure: LYSIS OF ADHESION;  Surgeon: Ardeth Sportsman, MD;  Location: [facility];  Service: General;  Laterality: N/A;   OOPHORECTOMY     PARTIAL HYSTERECTOMY  1990s?   [place], [place]  SPINAL CORD STIMULATOR IMPLANT     spine stimulator removal  12/13/2022   SPINE SURGERY N/A    Phreesia 07/29/2020   TRIGGER FINGER RELEASE  11/05/2007   right pinkie finger   TUBAL LIGATION  11/04/1992   Past Surgical History:  Procedure Laterality Date   ANTERIOR CERVICAL DECOMP/DISCECTOMY FUSION  07/07/2012   Procedure: ANTERIOR CERVICAL DECOMPRESSION/DISCECTOMY FUSION 2 LEVELS;  Surgeon: Karn Cassis, MD;  Location: [facility];  Service: Neurosurgery;  Laterality: N/A;  Cervical four-five, five - six  Anterior cervical decompression/diskectomy/fusion/plate   APPENDECTOMY  11/04/1984   BOWEL RESECTION N/A 07/29/2013   Procedure: serosal repair;  Surgeon: Ardeth Sportsman, MD;  Location: [facility];  Service: General;  Laterality: N/A;   CARPAL TUNNEL RELEASE Bilateral    COLON SURGERY N/A    Phreesia 07/29/2020   LAPAROSCOPY N/A 07/29/2013   Procedure: diagnostic laporoscopy;  Surgeon: Ardeth Sportsman, MD;  Location: [facility];  Service: General;  Laterality: N/A;   LAPAROSCOPY N/A 08/16/2013   Procedure: LAPAROSCOPY DIAGNOSTIC/LYSIS OF ADHESIONS;  Surgeon: Ardeth Sportsman, MD;  Location: [facility];  Service: General;  Laterality: N/A;   LAPAROTOMY N/A 08/16/2013   Procedure: EXPLORATORY LAPAROTOMY/SMALL BOWEL RESECTION (JEJUNUM);  Surgeon: Ardeth Sportsman, MD;  [facility]: [facility];  Service: General;  Laterality: N/A;   LUMBAR SPINE SURGERY  11/04/2008   x 3   LYSIS OF ADHESION  11/04/2001   Dr. Elpidio Anis   LYSIS OF ADHESION N/A 07/29/2013   Procedure: LYSIS OF ADHESION;  Surgeon: Ardeth Sportsman, MD;  Location: [facility];   Service: General;  Laterality: N/A;   OOPHORECTOMY     PARTIAL HYSTERECTOMY  1990s?   [place], [place]   SPINAL CORD STIMULATOR IMPLANT     spine stimulator removal  12/13/2022   SPINE SURGERY N/A    Phreesia 07/29/2020   TRIGGER FINGER RELEASE  11/05/2007   right pinkie finger   TUBAL LIGATION  11/04/1992   Past Medical History:  Diagnosis Date   Anemia    Asthma    Asthma flare 04/09/2013   Back pain    Bronchitis    Chronic abdominal pain    Chronic constipation    Constipation due to opioid therapy    Depression    Depression, major, single episode, severe (HCC) 10/03/2018   PHQ 9 score of 15   Diabetes mellitus without complication (HCC)    Diabetes mellitus, type II (HCC)    DVT (deep venous thrombosis) (HCC) 2010   GERD (gastroesophageal reflux disease)    Heart murmur    no cardiologist   Helicobacter pylori gastritis 06/11/2013   Colonoscopy Dr. Rhea Belton   Hypertension    IBS (irritable bowel syndrome)    Migraine headache    Neuropathy    Obesity    Obsessive-compulsive disorder    PSYCHOTIC D/O W/HALLUCINATIONS CONDS CLASS ELSW 03/04/2010   Qualifier: Diagnosis of  By: Lodema Hong MD, Margaret     PTSD (post-traumatic stress disorder)    SBO (small bowel obstruction) (HCC) 08/09/2013   Seasonal allergies 12/10/2012   Seizures (HCC)    Shortness of breath    There were no vitals taken for this visit.  Opioid Risk Score:   Fall Risk Score:  `1  Depression screen [facility] 2/9     12/17/2023   10:41 AM 12/17/2023    9:39 AM 11/11/2023    8:03 AM 10/20/2023    9:05 AM 09/22/2023    2:43 PM 08/25/2023  3:03 PM 07/01/2023    8:36 AM  Depression screen PHQ 2/9  Decreased Interest 2 0 0 0 0 0 0  Down, Depressed, Hopeless 2 0 0 0 0 0 0  PHQ - 2 Score 4 0 0 0 0 0 0  Altered sleeping 3  0      Tired, decreased energy 2  0      Change in appetite 1  0      Feeling bad or failure about yourself  2  0      Trouble concentrating 0  0      Moving slowly or fidgety/restless 0   0      Suicidal thoughts 0  0      PHQ-9 Score 12  0      Difficult doing work/chores Somewhat difficult  Not difficult at all        Review of Systems  Musculoskeletal:  Positive for back pain.       Bilateral shoulder, hand, knee, foot pain  All other systems reviewed and are negative.     Objective:   Physical Exam Vitals and nursing note reviewed.  Constitutional:      Appearance: Normal appearance.  Cardiovascular:     Rate and Rhythm: Normal rate and regular rhythm.     Pulses: Normal pulses.     Heart sounds: Normal heart sounds.  Pulmonary:     Effort: Pulmonary effort is normal.     Breath sounds: Normal breath sounds.  Musculoskeletal:     Comments: Normal Muscle Bulk and Muscle Testing Reveals:  Upper Extremities: Full ROM and Muscle Strength 5/5 Bilateral AC Joint tenderness Lumbar Paraspinal Tenderness: L-4-L-5 Lower Extremities: Full ROM and Muscle Strength 5/5 Arises from Table with ease Narrow Based Gait     Skin:    General: Skin is warm and dry.  Neurological:     Mental Status: She is alert and oriented to person, place, and time.  Psychiatric:        Mood and Affect: Mood normal.        Behavior: Behavior normal.         Assessment & Plan:  1.Chronic Pain of Bilateral Shoulders L>R: Continue to alternate Ice and Heat therapy. Continue HEP as Tolerated. Continue to Monitor. 12/30/2023 2. Failed Back Syndrome of Lumbar Syndrome : Continue HEP as Tolerated. Continue current medication regimen. Continue to Monitor. 12/30/2023 3. Lumbar Radiculitis: Continue HEP as Tolerated. Continue to Monitor.12/30/2023 4. Left Knee Pain: No Complaints today. Continue HEP as Tolerated. Continue to Monitor.12/30/2023 5. Muscle Spasm of Shoulders/ Upper Back : Continue Flexeril. Continue to Monitor.12/30/2023 6. Chronic Pain Syndrome: Refilled: Oxycodone 5mg /325 mg two tablets twice a day #120. Second script sent for the following month. We will continue the opioid  monitoring program, this consists of regular clinic visits, examinations, urine drug screen, pill counts as well as use of [place] Controlled Substance Reporting system. A 12 month History has been reviewed on the [place] Controlled Substance Reporting System on 12/30/2023. 7. Polyarthralgia: Continue HEP as Tolerated. Continue current medication regimen. Continue to monitor. 12/30/2023 8. Neuropathic Right Hand/ Neuropathic Left Hand: Continue current medication regimen. Continue to Monitor.   F/U in 2 months

## 2023-12-30 ENCOUNTER — Encounter: Payer: Medicaid Other | Attending: Physical Medicine and Rehabilitation | Admitting: Registered Nurse

## 2023-12-30 VITALS — BP 123/78 | HR 90 | Ht 66.0 in | Wt 167.0 lb

## 2023-12-30 DIAGNOSIS — G894 Chronic pain syndrome: Secondary | ICD-10-CM | POA: Diagnosis not present

## 2023-12-30 DIAGNOSIS — M5416 Radiculopathy, lumbar region: Secondary | ICD-10-CM | POA: Insufficient documentation

## 2023-12-30 DIAGNOSIS — M25562 Pain in left knee: Secondary | ICD-10-CM | POA: Insufficient documentation

## 2023-12-30 DIAGNOSIS — G8929 Other chronic pain: Secondary | ICD-10-CM | POA: Diagnosis not present

## 2023-12-30 DIAGNOSIS — M2550 Pain in unspecified joint: Secondary | ICD-10-CM | POA: Insufficient documentation

## 2023-12-30 DIAGNOSIS — M25511 Pain in right shoulder: Secondary | ICD-10-CM | POA: Insufficient documentation

## 2023-12-30 DIAGNOSIS — Z5181 Encounter for therapeutic drug level monitoring: Secondary | ICD-10-CM | POA: Insufficient documentation

## 2023-12-30 DIAGNOSIS — M25512 Pain in left shoulder: Secondary | ICD-10-CM | POA: Insufficient documentation

## 2023-12-30 DIAGNOSIS — Z79899 Other long term (current) drug therapy: Secondary | ICD-10-CM | POA: Diagnosis not present

## 2023-12-30 DIAGNOSIS — M792 Neuralgia and neuritis, unspecified: Secondary | ICD-10-CM | POA: Insufficient documentation

## 2023-12-30 DIAGNOSIS — M961 Postlaminectomy syndrome, not elsewhere classified: Secondary | ICD-10-CM | POA: Insufficient documentation

## 2023-12-30 DIAGNOSIS — M25561 Pain in right knee: Secondary | ICD-10-CM | POA: Insufficient documentation

## 2023-12-30 MED ORDER — OXYCODONE HCL 5 MG PO TABS: 10.0000 mg | ORAL_TABLET | Freq: Two times a day (BID) | ORAL | 0 refills | Status: DC

## 2023-12-30 MED ORDER — OXYCODONE HCL 5 MG PO TABS
10.0000 mg | ORAL_TABLET | Freq: Two times a day (BID) | ORAL | 0 refills | Status: DC
Start: 2023-12-30 — End: 2024-01-10

## 2023-12-31 ENCOUNTER — Ambulatory Visit (INDEPENDENT_AMBULATORY_CARE_PROVIDER_SITE_OTHER): Payer: 59 | Admitting: Family Medicine

## 2023-12-31 ENCOUNTER — Encounter: Payer: Self-pay | Admitting: Family Medicine

## 2023-12-31 VITALS — BP 130/74 | HR 77 | Temp 98.5°F | Resp 17 | Ht 66.0 in | Wt 166.0 lb

## 2023-12-31 DIAGNOSIS — J4551 Severe persistent asthma with (acute) exacerbation: Secondary | ICD-10-CM | POA: Diagnosis not present

## 2023-12-31 DIAGNOSIS — I10 Essential (primary) hypertension: Secondary | ICD-10-CM | POA: Diagnosis not present

## 2023-12-31 DIAGNOSIS — Z09 Encounter for follow-up examination after completed treatment for conditions other than malignant neoplasm: Secondary | ICD-10-CM

## 2023-12-31 DIAGNOSIS — F32A Depression, unspecified: Secondary | ICD-10-CM

## 2023-12-31 DIAGNOSIS — F419 Anxiety disorder, unspecified: Secondary | ICD-10-CM

## 2023-12-31 DIAGNOSIS — J329 Chronic sinusitis, unspecified: Secondary | ICD-10-CM

## 2023-12-31 DIAGNOSIS — J40 Bronchitis, not specified as acute or chronic: Secondary | ICD-10-CM

## 2023-12-31 MED ORDER — PROMETHAZINE-DM 6.25-15 MG/5ML PO SYRP: ORAL_SOLUTION | ORAL | 0 refills | Status: DC

## 2023-12-31 MED ORDER — METHYLPREDNISOLONE ACETATE 80 MG/ML IJ SUSP
80.0000 mg | Freq: Once | INTRAMUSCULAR | Status: AC
  Administered 2023-12-31: 80 mg via INTRAMUSCULAR

## 2023-12-31 MED ORDER — PREDNISONE 10 MG (21) PO TBPK: ORAL_TABLET | ORAL | 0 refills | Status: DC

## 2023-12-31 MED ORDER — BENZONATATE 200 MG PO CAPS: 200.0000 mg | ORAL_CAPSULE | Freq: Two times a day (BID) | ORAL | 0 refills | Status: DC | PRN

## 2023-12-31 MED ORDER — PREDNISONE 20 MG PO TABS
20.0000 mg | ORAL_TABLET | Freq: Two times a day (BID) | ORAL | 0 refills | Status: DC
Start: 2023-12-31 — End: 2024-03-22

## 2023-12-31 MED ORDER — DOXYCYCLINE HYCLATE 100 MG PO TABS: 100.0000 mg | ORAL_TABLET | Freq: Two times a day (BID) | ORAL | 0 refills | Status: DC

## 2023-12-31 NOTE — Patient Instructions (Addendum)
 Reschedule 2/28 appointment to mid March please  Nurse pls ambulate pt to see if oxygen falls below 90 if it does needs supplemental oxygen at 2 L/min  Depo medrol 80 mg iM in office, thn start taday prednisone 20 mg tablets prescribed for 1 week, to be followed by 10 mg dose pack  Scan of sinus will be done  Re submit sputum for culture  Tesslon perles and cough suppressant syrup ar prescribed and doxycycline   Urgent referral to pulmonary Specialist  Thanks for choosing [facility], we consider it a privelige to serve you.

## 2024-01-01 ENCOUNTER — Ambulatory Visit: Payer: 59 | Admitting: Family Medicine

## 2024-01-01 ENCOUNTER — Encounter (HOSPITAL_BASED_OUTPATIENT_CLINIC_OR_DEPARTMENT_OTHER): Payer: Self-pay

## 2024-01-01 DIAGNOSIS — J4551 Severe persistent asthma with (acute) exacerbation: Secondary | ICD-10-CM | POA: Diagnosis not present

## 2024-01-02 ENCOUNTER — Ambulatory Visit (HOSPITAL_COMMUNITY): Payer: 59

## 2024-01-02 ENCOUNTER — Inpatient Hospital Stay: Payer: 59 | Admitting: Family Medicine

## 2024-01-03 ENCOUNTER — Other Ambulatory Visit: Payer: Self-pay | Admitting: Family Medicine

## 2024-01-05 ENCOUNTER — Ambulatory Visit (HOSPITAL_COMMUNITY)
Admission: RE | Admit: 2024-01-05 | Discharge: 2024-01-05 | Disposition: A | Discharge status: Home / Self Care | Payer: 59 | Source: Ambulatory Visit | Attending: Family Medicine | Admitting: Family Medicine

## 2024-01-05 DIAGNOSIS — J329 Chronic sinusitis, unspecified: Secondary | ICD-10-CM | POA: Diagnosis not present

## 2024-01-05 DIAGNOSIS — J342 Deviated nasal septum: Secondary | ICD-10-CM | POA: Diagnosis not present

## 2024-01-06 ENCOUNTER — Encounter: Payer: Self-pay | Admitting: Family Medicine

## 2024-01-06 DIAGNOSIS — Z09 Encounter for follow-up examination after completed treatment for conditions other than malignant neoplasm: Secondary | ICD-10-CM | POA: Insufficient documentation

## 2024-01-06 DIAGNOSIS — J40 Bronchitis, not specified as acute or chronic: Secondary | ICD-10-CM | POA: Insufficient documentation

## 2024-01-06 DIAGNOSIS — J4551 Severe persistent asthma with (acute) exacerbation: Secondary | ICD-10-CM | POA: Insufficient documentation

## 2024-01-06 NOTE — Assessment & Plan Note (Signed)
 Controlled, no change in medication

## 2024-01-06 NOTE — Assessment & Plan Note (Signed)
 Over 4 week history, reports deterioration in health with worsening symptoms, sputum c/s needed, chest scan recently done showed clear  lungs

## 2024-01-06 NOTE — Assessment & Plan Note (Signed)
 Patient in for follow up of recent Ed visit on 2/17 with dx of acute bronchitis Discharge summary, and laboratory and radiology data are reviewed, and any questions or concerns  are discussed. Specific issues requiring follow up are specifically addressed.

## 2024-01-06 NOTE — Assessment & Plan Note (Signed)
 Requests referral to specific Therapist due to ongoing symptoms

## 2024-01-06 NOTE — Assessment & Plan Note (Signed)
 4 week h/o sinus pressure and drainage , green, despite antibiotic treatments, scan sinus needed and specimen for c/s

## 2024-01-06 NOTE — Progress Notes (Signed)
   Jane English     MRN: [id number]      DOB: Mar 29, 1971  Chief Complaint  Patient presents with   Bronchitis    Was diagnosed with bronchitis at the urgent care on 2/17. Covid/flu/RSV tests were negative. Given prednisone but its not getting better. Still congested and coughing up and blowing out thick yellow mucus. Has been drinking ginger and honey tea. Coughing bad at night, can hardly rest. Has used inhalers and neb. Also used mucinex D. Walked for 2 minutes and oxygen level stayed between 96-97    HPI Jane English is here for follow up of uncontrolled and ongoing head and chest congestion with wheezing and shortness of breath, symptoms started 12/08/2023, and have worsened per pt report. Has had 2 clinic visits and was in the ED on 12/22/2023, here today reporting that medication is not working, has green sputum and post nasal drainage and uncontrolled cough and wheezing Requests referral to a specific Provider for therapy for depression and anxiety C/o anxiety about health asks if she is going to die because of her extreme difficulty at night when she is having bouts of excess cough and wheeze Using neb treatments at least 4 times daily ROS Denies abdominal pain, nausea, vomiting,diarrhea or constipation.   Denies dysuria, frequency, hesitancy or incontinence. Denies joint pain, swelling and limitation in mobility. Denies headaches, seizures, numbness, or tingling.  . Denies skin break down or rash.   PE  BP 130/74   Pulse 77   Temp 98.5 F (36.9 C) (Oral)   Resp 17   Ht 5\' 6"  (1.676 m)   Wt 166 lb (75.3 kg)   SpO2 93%   BMI 26.79 kg/m   Patient alert and oriented excessive coughing during visit.  HEENT: No facial asymmetry, EOMI,     Neck supple .Sinus tenderness frontal and maxillary, no cervical adenopathy  Chest: decreased air entry , bilateral  wheezing CVS: S1, S2 no murmurs, no S3.Regular rate.  ABD: Soft non tender.   Ext: No edema  MS: Adequate ROM spine,  shoulders, hips and knees.  Skin: Intact, no ulcerations or rash noted.  Psych: Good eye contact, Memory intact anxious not  depressed appearing.  CNS: CN 2-12 intact, power,  normal throughout.no focal deficits noted.   Assessment & Plan  Chronic sinusitis 4 week h/o sinus pressure and drainage , green, despite antibiotic treatments, scan sinus needed and specimen for c/s  Bronchitis Over 4 week history, reports deterioration in health with worsening symptoms, sputum c/s needed, chest scan recently done showed clear  lungs  Severe persistent asthma with acute exacerbation Depo medroil 80 mg Im in office followed by pred 20 mg twice daily for 5 days then prednisone 10 mg dose pack , also urgent referral to Pulmonary  Essential hypertension Controlled, no change in medication   Anxiety and depression Requests referral to specific Therapist due to ongoing symptoms  Encounter for examination following treatment at hospital Patient in for follow up of recent Ed visit on 2/17 with dx of acute bronchitis Discharge summary, and laboratory and radiology data are reviewed, and any questions or concerns  are discussed. Specific issues requiring follow up are specifically addressed.

## 2024-01-06 NOTE — Assessment & Plan Note (Signed)
 Depo medroil 80 mg Im in office followed by pred 20 mg twice daily for 5 days then prednisone 10 mg dose pack , also urgent referral to Pulmonary

## 2024-01-07 ENCOUNTER — Encounter (HOSPITAL_COMMUNITY): Payer: Self-pay

## 2024-01-07 ENCOUNTER — Ambulatory Visit (HOSPITAL_COMMUNITY): Payer: Medicare HMO | Admitting: Psychiatry

## 2024-01-08 ENCOUNTER — Other Ambulatory Visit: Payer: Self-pay | Admitting: Family Medicine

## 2024-01-08 ENCOUNTER — Encounter: Payer: Self-pay | Admitting: Family Medicine

## 2024-01-08 DIAGNOSIS — M79661 Pain in right lower leg: Secondary | ICD-10-CM | POA: Diagnosis not present

## 2024-01-08 DIAGNOSIS — I8312 Varicose veins of left lower extremity with inflammation: Secondary | ICD-10-CM | POA: Diagnosis not present

## 2024-01-08 DIAGNOSIS — I83893 Varicose veins of bilateral lower extremities with other complications: Secondary | ICD-10-CM | POA: Diagnosis not present

## 2024-01-08 DIAGNOSIS — I8311 Varicose veins of right lower extremity with inflammation: Secondary | ICD-10-CM | POA: Diagnosis not present

## 2024-01-08 LAB — GRAM STAIN W/SPUTUM CULT RFLX

## 2024-01-08 LAB — SPUTUM CULTURE

## 2024-01-08 MED ORDER — FLUCONAZOLE 100 MG PO TABS: 100.0000 mg | ORAL_TABLET | Freq: Every day | ORAL | 0 refills | Status: AC

## 2024-01-08 NOTE — Addendum Note (Signed)
 Addended by: Kerri Perches on: 01/08/2024 06:09 AM   Modules accepted: Orders

## 2024-01-10 ENCOUNTER — Other Ambulatory Visit: Payer: Self-pay | Admitting: Physical Medicine and Rehabilitation

## 2024-01-10 MED ORDER — OXYCODONE HCL 5 MG PO TABS: 10.0000 mg | ORAL_TABLET | Freq: Two times a day (BID) | ORAL | 0 refills | Status: DC

## 2024-01-12 ENCOUNTER — Telehealth: Payer: Self-pay | Admitting: Physical Medicine and Rehabilitation

## 2024-01-12 ENCOUNTER — Other Ambulatory Visit (HOSPITAL_COMMUNITY): Payer: Self-pay

## 2024-01-12 ENCOUNTER — Other Ambulatory Visit: Payer: Self-pay | Admitting: Physical Medicine and Rehabilitation

## 2024-01-12 MED ORDER — OXYCODONE HCL 5 MG PO TABS
10.0000 mg | ORAL_TABLET | Freq: Two times a day (BID) | ORAL | 0 refills | Status: DC
  Filled 2024-01-12: qty 120, 30d supply, fill #0

## 2024-01-12 NOTE — Telephone Encounter (Signed)
 Patient called in requesting [facility] of [facility] for [facility] since [facility] not [facility] medication in [facility] , states patient spoke to Dr Carlis Abbott over the weekend

## 2024-01-13 ENCOUNTER — Other Ambulatory Visit: Payer: Self-pay | Admitting: Internal Medicine

## 2024-01-13 NOTE — Telephone Encounter (Signed)
 [facility] patient picked up yesterday.

## 2024-01-14 DIAGNOSIS — I872 Venous insufficiency (chronic) (peripheral): Secondary | ICD-10-CM | POA: Diagnosis not present

## 2024-01-20 DIAGNOSIS — I83811 Varicose veins of right lower extremities with pain: Secondary | ICD-10-CM | POA: Diagnosis not present

## 2024-01-20 DIAGNOSIS — I80201 Phlebitis and thrombophlebitis of unspecified deep vessels of right lower extremity: Secondary | ICD-10-CM | POA: Diagnosis not present

## 2024-01-20 DIAGNOSIS — Z09 Encounter for follow-up examination after completed treatment for conditions other than malignant neoplasm: Secondary | ICD-10-CM | POA: Diagnosis not present

## 2024-01-21 DIAGNOSIS — I83892 Varicose veins of left lower extremities with other complications: Secondary | ICD-10-CM | POA: Diagnosis not present

## 2024-01-23 ENCOUNTER — Telehealth: Payer: Self-pay | Admitting: Family Medicine

## 2024-01-23 DIAGNOSIS — I83891 Varicose veins of right lower extremities with other complications: Secondary | ICD-10-CM | POA: Diagnosis not present

## 2024-01-23 NOTE — Telephone Encounter (Signed)
 Patient came by office in regard to [facility] referral to [facility]   Patient wants referral placed with Harrie Foreman   Wants a call back in regard.

## 2024-01-23 NOTE — Telephone Encounter (Signed)
 Referral was placed on 02/26. Msg sent to referral team to check on status. Waiting for update

## 2024-01-26 ENCOUNTER — Telehealth: Payer: Self-pay | Admitting: Family Medicine

## 2024-01-26 DIAGNOSIS — I83811 Varicose veins of right lower extremities with pain: Secondary | ICD-10-CM | POA: Diagnosis not present

## 2024-01-26 DIAGNOSIS — I83891 Varicose veins of right lower extremities with other complications: Secondary | ICD-10-CM | POA: Diagnosis not present

## 2024-01-26 NOTE — Telephone Encounter (Signed)
 Copied from CRM [id number]. Topic: General - Other >> Jan 23, 2024  8:00 AM Whitney O wrote: Reason for CRM: patient is calling to get a message to brandy dr simpson nurse . Patient says she need her to give her a call on personal matter. [phone] Concerning patients health . She would not give details . Please give patient a call

## 2024-01-28 NOTE — Telephone Encounter (Signed)
 Spoke with patient. She stated that the [facility] called to schedule an appt, however, the did not schedule patient with Delice Bison. She states she is only comfortable seeing Delice Bison. She would like assistance in getting an appointment with Delice Bison.  Also, during her AWV on 11/11/2023, patient requested a prescription for a handicap attachment for her commode. She has difficulty getting up after using the restroom due to chronic low back pain and bilateral knee pain.

## 2024-01-29 NOTE — Telephone Encounter (Signed)
 Jane English was scheduled with a provider (at [facility]) she didn't request. She said the scheduler was ugly and made her yell on the phone so she ended the call and doesn't want to call back out there to request the provider she had wanted. I spoke with the referral team to see if there was anythng she could do to help. Waiting on response

## 2024-01-30 ENCOUNTER — Ambulatory Visit (INDEPENDENT_AMBULATORY_CARE_PROVIDER_SITE_OTHER): Payer: Self-pay | Admitting: Family Medicine

## 2024-01-30 ENCOUNTER — Encounter: Payer: Self-pay | Admitting: Family Medicine

## 2024-01-30 ENCOUNTER — Telehealth: Payer: Self-pay | Admitting: Family Medicine

## 2024-01-30 VITALS — BP 144/75 | HR 96 | Resp 16 | Ht 66.0 in | Wt 177.4 lb

## 2024-01-30 DIAGNOSIS — M1711 Unilateral primary osteoarthritis, right knee: Secondary | ICD-10-CM | POA: Diagnosis not present

## 2024-01-30 DIAGNOSIS — E785 Hyperlipidemia, unspecified: Secondary | ICD-10-CM | POA: Diagnosis not present

## 2024-01-30 DIAGNOSIS — F5104 Psychophysiologic insomnia: Secondary | ICD-10-CM | POA: Diagnosis not present

## 2024-01-30 DIAGNOSIS — I10 Essential (primary) hypertension: Secondary | ICD-10-CM | POA: Diagnosis not present

## 2024-01-30 DIAGNOSIS — M549 Dorsalgia, unspecified: Secondary | ICD-10-CM | POA: Diagnosis not present

## 2024-01-30 DIAGNOSIS — J324 Chronic pansinusitis: Secondary | ICD-10-CM | POA: Diagnosis not present

## 2024-01-30 DIAGNOSIS — M1712 Unilateral primary osteoarthritis, left knee: Secondary | ICD-10-CM

## 2024-01-30 DIAGNOSIS — E1169 Type 2 diabetes mellitus with other specified complication: Secondary | ICD-10-CM | POA: Diagnosis not present

## 2024-01-30 DIAGNOSIS — F411 Generalized anxiety disorder: Secondary | ICD-10-CM

## 2024-01-30 DIAGNOSIS — F322 Major depressive disorder, single episode, severe without psychotic features: Secondary | ICD-10-CM

## 2024-01-30 DIAGNOSIS — E049 Nontoxic goiter, unspecified: Secondary | ICD-10-CM | POA: Diagnosis not present

## 2024-01-30 MED ORDER — ALBUTEROL SULFATE HFA 108 (90 BASE) MCG/ACT IN AERS: INHALATION_SPRAY | RESPIRATORY_TRACT | 4 refills | Status: DC

## 2024-01-30 MED ORDER — FLUTICASONE PROPIONATE 50 MCG/ACT NA SUSP: 1.0000 | Freq: Every day | NASAL | 1 refills | Status: DC

## 2024-01-30 MED ORDER — QVAR REDIHALER 40 MCG/ACT IN AERB: 2.0000 | INHALATION_SPRAY | Freq: Two times a day (BID) | RESPIRATORY_TRACT | 5 refills | Status: DC

## 2024-01-30 MED ORDER — TIRZEPATIDE 5 MG/0.5ML ~~LOC~~ SOAJ: 5.0000 mg | SUBCUTANEOUS | 1 refills | Status: DC

## 2024-01-30 MED ORDER — BUSPIRONE HCL 7.5 MG PO TABS: ORAL_TABLET | ORAL | 2 refills | Status: DC

## 2024-01-30 MED ORDER — FLUTICASONE FUROATE-VILANTEROL 100-25 MCG/ACT IN AEPB: INHALATION_SPRAY | RESPIRATORY_TRACT | 1 refills | Status: DC

## 2024-01-30 NOTE — Telephone Encounter (Signed)
 Copied from CRM [id number]. Topic: Referral - Question >> Jan 29, 2024  4:47 PM Victorino Dike T wrote: Reason for CRM: returning call to [facility] about [facility], asking for [facility] to call back tomorrow [phone]

## 2024-01-30 NOTE — Telephone Encounter (Signed)
 Has appt today in office, will address concern at that time

## 2024-01-30 NOTE — Assessment & Plan Note (Addendum)
 [place] to evaluate , record review recent scan reports normal thyroid gland, no need to order [place], will let pt know

## 2024-01-30 NOTE — Patient Instructions (Addendum)
 Annual exam earl;y September, call if you need me before  F/u in 8 weeks re evaluate    You are being referred for thyrod ultrasound  New for anxiety and sleep is buspar at bedtime  You do need to get back in therapy, I will refer you to [facility]  It is important that you exercise regularly at least 30 minutes 5 times a week. If you develop chest pain, have severe difficulty breathing, or feel very tired, stop exercising immediately and seek medical attention   Thanks for choosing [facility], we consider it a privelige to serve you.

## 2024-02-01 ENCOUNTER — Encounter: Payer: Self-pay | Admitting: Family Medicine

## 2024-02-01 ENCOUNTER — Other Ambulatory Visit (HOSPITAL_COMMUNITY): Payer: Self-pay | Admitting: Family Medicine

## 2024-02-01 DIAGNOSIS — J329 Chronic sinusitis, unspecified: Secondary | ICD-10-CM

## 2024-02-01 DIAGNOSIS — R6889 Other general symptoms and signs: Secondary | ICD-10-CM

## 2024-02-01 DIAGNOSIS — R930 Abnormal findings on diagnostic imaging of skull and head, not elsewhere classified: Secondary | ICD-10-CM

## 2024-02-01 NOTE — Progress Notes (Unsigned)
 Jane English     MRN: [id number]      DOB: 03/03/1971  Chief Complaint  Patient presents with   Medication Refill    Needs mounjaro and inhalers refilled    DME    Wants toliet extender and shower chair bench   Insomnia    Has a hard time sleeping at night     HPI Jane English is here for follow up and re-evaluation of chronic medical conditions, medication management and review of any available recent lab and radiology data. C/o difficulty bending to regular commode as well as difficulty standing in the shower , both due to chronic back pain  has stenosis in lumbar spine and arthritis in left knee Both cause chronic uncontrolled pain and , limit ability to squat, bend knees or stand for long periods, quality of life and safety while toilet ting and showering will be improved with toilet extender and shower bench and are therefore justified needs on medical grounds C/o increased stress and anxiety , her very close friend recently lost his sister and while supporting him through this process , she is also grieving the loss. Crying stating only "looking for love" and again reports feeling rejected by 2 of her 3 children esp her daughter , and states she has only always done her very best to support them as a single mother . Is ready for and needs Psych help, medication and therapy, not suicidal or homicidal, but extremely poor level of function because of severe depression  ROS Denies recent fever or chills. Chronic sinus drainage and cough needs meds refilled, still waiting on sinus report Denies chest pains, palpitations and leg swelling Denies abdominal pain, nausea, vomiting,diarrhea or constipation.   Denies dysuria, frequency, hesitancy or incontinence.  Denies skin break down or rash.   PE  BP (!) 144/75   Pulse 96   Resp 16   Ht 5\' 6"  (1.676 m)   Wt 177 lb 6.4 oz (80.5 kg)   SpO2 95%   BMI 28.63 kg/m   Patient alert and oriented and in no cardiopulmonary  distress.  HEENT: No facial asymmetry, EOMI,     Neck supple .thyromegaly  Chest: Clear to auscultation bilaterally.  CVS: S1, S2 no murmurs, no S3.Regular rate.  ABD: Soft non tender.   Ext: No edema  MS: decreased  ROM spine,  and knees.  Skin: Intact, no ulcerations or rash noted.  Psych: Good eye contact, flat affect. Memory intact tearful, anxious and  depressed appearing.  CNS: CN 2-12 intact, power,  normal throughout.no focal deficits noted.   Assessment & Plan  Goiter [place] to evaluate , record review recent scan reports normal thyroid gland, no need to order [place], will let pt know  Insomnia Sleep hygiene reviewed and written information offered also. Prescription sent for  medication needed. Unconntrolled , recent loss of close friend's sister Start buspar at bedtime also refer to Therapy and psych  Back pain with radiation Lumbar stenosis s/p surgery limiting ability to stand and squat recommend toilet extender and shower bench  Chronic sinusitis Need f/u on Ct scan   Type 2 diabetes mellitus with other specified complication (HCC) Diabetes associated with hypertension, hyperlipidemia, and arthritis  Jane English is reminded of the importance of commitment to daily physical activity for 30 minutes or more, as able and the need to limit carbohydrate intake to 30 to 60 grams per meal to help with blood sugar control.   The need to take  medication as prescribed, test blood sugar as directed, and to call between visits if there is a concern that blood sugar is uncontrolled is also discussed.   Jane English is reminded of the importance of daily foot exam, annual eye examination, and good blood sugar, blood pressure and cholesterol control.     Latest Ref Rng & Units 12/22/2023    3:06 PM 12/17/2023   10:36 AM 07/01/2023    9:28 AM 03/12/2023   10:57 AM 09/23/2022    8:36 AM  Diabetic Labs  HbA1c 4.8 - 5.6 %  5.9  6.0  5.9  5.9   Micro/Creat Ratio 0 - 29 mg/g creat  5       Chol 100 - 199 mg/dL   161  096  045   HDL >40 mg/dL   66  62  58   Calc LDL 0 - 99 mg/dL   94  981  88   Triglycerides 0 - 149 mg/dL   191  71  478   Creatinine 0.44 - 1.00 mg/dL 2.95  6.21  3.08  6.57  1.06       01/30/2024   11:10 AM 12/31/2023    3:18 PM 12/30/2023    3:32 PM 12/22/2023    8:30 PM 12/22/2023    8:00 PM 12/22/2023    6:26 PM 12/22/2023    2:38 PM  BP/Weight  Systolic BP 144 130 123 151 142 132 144  Diastolic BP 75 74 78 102 88 77 82  Wt. (Lbs) 177.4 166 167      BMI 28.63 kg/m2 26.79 kg/m2 26.95 kg/m2          Latest Ref Rng & Units 12/17/2023    9:40 AM 07/10/2023   12:00 AM  Foot/eye exam completion dates  Eye Exam No Retinopathy  No Retinopathy      Foot Form Completion  Done      This result is from an external source.        Primary osteoarthritis of right knee Limits ability to squat over commode, needs extender  Primary osteoarthritis of left knee Needs toilet extender due to pain and stiffness of knee  Essential hypertension Elevated at visit but reports poor sleep increased stress and aniety, no nmed change DASH diet and commitment to daily physical activity for a minimum of 30 minutes discussed and encouraged, as a part of hypertension management. The importance of attaining a healthy weight is also discussed.     01/30/2024   11:10 AM 12/31/2023    3:18 PM 12/30/2023    3:32 PM 12/22/2023    8:30 PM 12/22/2023    8:00 PM 12/22/2023    6:26 PM 12/22/2023    2:38 PM  BP/Weight  Systolic BP 144 130 123 151 142 132 144  Diastolic BP 75 74 78 102 88 77 82  Wt. (Lbs) 177.4 166 167      BMI 28.63 kg/m2 26.79 kg/m2 26.95 kg/m2           Hyperlipidemia LDL goal <100 Hyperlipidemia:Low fat diet discussed and encouraged.   Lipid Panel  Lab Results  Component Value Date   CHOL 204 (H) 07/01/2023   HDL 66 07/01/2023   LDLCALC 94 07/01/2023   TRIG 268 (H) 07/01/2023   CHOLHDL 3.1 07/01/2023     Updated lab needed at/ before next  visit.

## 2024-02-01 NOTE — Progress Notes (Incomplete)
   Jane English     MRN: [id number]      DOB: May 06, 1971  Chief Complaint  Patient presents with  . Medication Refill    Needs mounjaro and inhalers refilled   . DME    Wants toliet extender and shower chair bench  . Insomnia    Has a hard time sleeping at night     HPI Jane English is here for follow up and re-evaluation of chronic medical conditions, medication management and review of any available recent lab and radiology data. C/o difficulty bending to regular commode as well as difficulty standing in the shower , both due to chronic back pain  has stenosis in lumbar spine and arthritis in left knee Both cause chronic uncontrolled pain and , limit ability to squat, bend knees or stand for long periods, quality of life and safety while toilet ting and showering will be improved with toilet extender and shower bench and are therefore justified needs on medical grounds C/o increased stress and anxiety , her very close friend recently lost his sister and while supporting him through this process , she is also grieving the loss. Crying stating only "looking for love" and again reports feeling rejected by 2 of her 3 children esp her daughter , and states she has only always done her very best to support them as a single mother . Is ready for and needs Psych help, medication and therapy, not suicidal or homicidal, but extremely poor level of function because of severe depression  ROS Denies recent fever or chills. Chronic sinus drainage and cough needs meds refilled, still waiting on sinus report Denies chest pains, palpitations and leg swelling Denies abdominal pain, nausea, vomiting,diarrhea or constipation.   Denies dysuria, frequency, hesitancy or incontinence.  Denies skin break down or rash.   PE  BP (!) 144/75   Pulse 96   Resp 16   Ht 5\' 6"  (1.676 m)   Wt 177 lb 6.4 oz (80.5 kg)   SpO2 95%   BMI 28.63 kg/m   Patient alert and oriented and in no cardiopulmonary  distress.  HEENT: No facial asymmetry, EOMI,     Neck supple .thyromegaly  Chest: Clear to auscultation bilaterally.  CVS: S1, S2 no murmurs, no S3.Regular rate.  ABD: Soft non tender.   Ext: No edema  MS: decreased  ROM spine,  and knees.  Skin: Intact, no ulcerations or rash noted.  Psych: Good eye contact, flat affect. Memory intact tearful, anxious and  depressed appearing.  CNS: CN 2-12 intact, power,  normal throughout.no focal deficits noted.   Assessment & Plan  Goiter [place] to evaluate

## 2024-02-02 ENCOUNTER — Encounter: Payer: Self-pay | Admitting: Family Medicine

## 2024-02-02 DIAGNOSIS — I83892 Varicose veins of left lower extremities with other complications: Secondary | ICD-10-CM | POA: Diagnosis not present

## 2024-02-02 NOTE — Assessment & Plan Note (Signed)
 Sleep hygiene reviewed and written information offered also. Prescription sent for  medication needed. Unconntrolled , recent loss of close friend's sister Start buspar at bedtime also refer to Therapy and psych

## 2024-02-02 NOTE — Assessment & Plan Note (Signed)
 Elevated at visit but reports poor sleep increased stress and aniety, no nmed change DASH diet and commitment to daily physical activity for a minimum of 30 minutes discussed and encouraged, as a part of hypertension management. The importance of attaining a healthy weight is also discussed.     01/30/2024   11:10 AM 12/31/2023    3:18 PM 12/30/2023    3:32 PM 12/22/2023    8:30 PM 12/22/2023    8:00 PM 12/22/2023    6:26 PM 12/22/2023    2:38 PM  BP/Weight  Systolic BP 144 130 123 151 142 132 144  Diastolic BP 75 74 78 102 88 77 82  Wt. (Lbs) 177.4 166 167      BMI 28.63 kg/m2 26.79 kg/m2 26.95 kg/m2

## 2024-02-02 NOTE — Assessment & Plan Note (Signed)
 Lumbar stenosis s/p surgery limiting ability to stand and squat recommend toilet extender and shower bench

## 2024-02-02 NOTE — Assessment & Plan Note (Signed)
 Needs toilet extender due to pain and stiffness of knee

## 2024-02-02 NOTE — Assessment & Plan Note (Signed)
 Need f/u on Ct scan

## 2024-02-02 NOTE — Assessment & Plan Note (Signed)
 Hyperlipidemia:Low fat diet discussed and encouraged.   Lipid Panel  Lab Results  Component Value Date   CHOL 204 (H) 07/01/2023   HDL 66 07/01/2023   LDLCALC 94 07/01/2023   TRIG 268 (H) 07/01/2023   CHOLHDL 3.1 07/01/2023     Updated lab needed at/ before next visit.

## 2024-02-02 NOTE — Assessment & Plan Note (Signed)
 Diabetes associated with hypertension, hyperlipidemia, and arthritis  Jane English is reminded of the importance of commitment to daily physical activity for 30 minutes or more, as able and the need to limit carbohydrate intake to 30 to 60 grams per meal to help with blood sugar control.   The need to take medication as prescribed, test blood sugar as directed, and to call between visits if there is a concern that blood sugar is uncontrolled is also discussed.   Jane English is reminded of the importance of daily foot exam, annual eye examination, and good blood sugar, blood pressure and cholesterol control.     Latest Ref Rng & Units 12/22/2023    3:06 PM 12/17/2023   10:36 AM 07/01/2023    9:28 AM 03/12/2023   10:57 AM 09/23/2022    8:36 AM  Diabetic Labs  HbA1c 4.8 - 5.6 %  5.9  6.0  5.9  5.9   Micro/Creat Ratio 0 - 29 mg/g creat  5      Chol 100 - 199 mg/dL   409  811  914   HDL >78 mg/dL   66  62  58   Calc LDL 0 - 99 mg/dL   94  295  88   Triglycerides 0 - 149 mg/dL   621  71  308   Creatinine 0.44 - 1.00 mg/dL 6.57  8.46  9.62  9.52  1.06       01/30/2024   11:10 AM 12/31/2023    3:18 PM 12/30/2023    3:32 PM 12/22/2023    8:30 PM 12/22/2023    8:00 PM 12/22/2023    6:26 PM 12/22/2023    2:38 PM  BP/Weight  Systolic BP 144 130 123 151 142 132 144  Diastolic BP 75 74 78 102 88 77 82  Wt. (Lbs) 177.4 166 167      BMI 28.63 kg/m2 26.79 kg/m2 26.95 kg/m2          Latest Ref Rng & Units 12/17/2023    9:40 AM 07/10/2023   12:00 AM  Foot/eye exam completion dates  Eye Exam No Retinopathy  No Retinopathy      Foot Form Completion  Done      This result is from an external source.

## 2024-02-02 NOTE — Assessment & Plan Note (Signed)
 Limits ability to squat over commode, needs extender

## 2024-02-05 DIAGNOSIS — I83811 Varicose veins of right lower extremities with pain: Secondary | ICD-10-CM | POA: Diagnosis not present

## 2024-02-05 DIAGNOSIS — I83891 Varicose veins of right lower extremities with other complications: Secondary | ICD-10-CM | POA: Diagnosis not present

## 2024-02-10 ENCOUNTER — Ambulatory Visit: Payer: Medicare HMO | Admitting: Registered Nurse

## 2024-02-10 ENCOUNTER — Other Ambulatory Visit: Payer: Self-pay | Admitting: Internal Medicine

## 2024-02-13 ENCOUNTER — Other Ambulatory Visit: Payer: Self-pay | Admitting: Family Medicine

## 2024-02-21 ENCOUNTER — Other Ambulatory Visit: Payer: Self-pay | Admitting: Family Medicine

## 2024-02-23 DIAGNOSIS — I83891 Varicose veins of right lower extremities with other complications: Secondary | ICD-10-CM | POA: Diagnosis not present

## 2024-02-24 NOTE — Progress Notes (Unsigned)
 Subjective:    Patient ID: Jane English, female    DOB: 27-May-1971, 53 y.o.   MRN: [id number]  HPI: Jane English is a 53 y.o. female who returns for follow up appointment for chronic pain and medication refill. She states her pain is located in her bilateral shoulders, lower back radiating into her bilateral lower extremities and bilateral knee pain . She rates her pain 8. Her current exercise regime is walking and performing stretching exercises.  Ms. Loeper Morphine  equivalent is 30.00 MME.   Oral Swab was Performed.      Pain Inventory Average Pain 8 Pain Right Now 8 My pain is constant, sharp, burning, dull, stabbing, tingling, and aching  In the last 24 hours, has pain interfered with the following? General activity 8 Relation with others 8 Enjoyment of life 8 What TIME of day is your pain at its worst? morning , evening, and night Sleep (in general) Fair  Pain is worse with: walking, bending, sitting, standing, and some activites Pain improves with: heat/ice and medication Relief from Meds: 9  Family History  Problem Relation Age of Onset   Physical abuse Mother    Alcohol abuse Mother    Cirrhosis Mother    Lung cancer Father    Stomach cancer Father    Esophageal cancer Father    Alcohol abuse Father    Mental illness Father    Diabetes Sister    Hypertension Sister    Bipolar disorder Sister    Schizophrenia Sister    Diabetes Sister    Drug abuse Sister    HIV Sister    Pneumonia Sister        died as a baby   Alcohol abuse Brother    Hypertension Brother    Kidney disease Brother    Diabetes Brother    Drug abuse Brother    Mental illness Brother    Alcohol abuse Brother    Alcohol abuse Brother    Hypertension Brother    Diabetes Brother    Alcohol abuse Brother    Mental illness Brother        in [place]   Alcohol abuse Brother    Alcohol abuse Brother    Bipolar disorder Brother    Hypertension Brother    Bipolar disorder Brother    Drug  abuse Brother    Alcohol abuse Brother    Bipolar disorder Brother    Bipolar disorder Daughter    Bipolar disorder Son    Bipolar disorder Son    ADD / ADHD Neg Hx    Anxiety disorder Neg Hx    Dementia Neg Hx    Depression Neg Hx    OCD Neg Hx    Seizures Neg Hx    Paranoid behavior Neg Hx    Colon cancer Neg Hx    Rectal cancer Neg Hx    Social History   Socioeconomic History   Marital status: Legally Separated    Spouse name: Porfirio Bristol   Number of children: 4   Years of education: 12   Highest education level: GED or equivalent  Occupational History   Occupation: disabled  Tobacco Use   Smoking status: Never    Passive exposure: Never   Smokeless tobacco: Never  Vaping Use   Vaping status: Never Used  Substance and Sexual Activity   Alcohol use: No   Drug use: No   Sexual activity: Not Currently    Partners: Male    Birth  control/protection: Surgical    Comment: tubal  Other Topics Concern   Not on file  Social History Narrative   Not on file   Social Drivers of Health   Financial Resource Strain: Low Risk  (12/30/2023)   Overall Financial Resource Strain (CARDIA)    Difficulty of Paying Living Expenses: Not hard at all  Food Insecurity: Unknown (12/30/2023)   Hunger Vital Sign    Worried About Running Out of Food in the Last Year: Never true    Ran Out of Food in the Last Year: Patient declined  Transportation Needs: No Transportation Needs (12/30/2023)   PRAPARE - Administrator, Civil Service (Medical): No    Lack of Transportation (Non-Medical): No  Physical Activity: Patient Declined (11/11/2023)   Exercise Vital Sign    Days of Exercise per Week: Patient declined    Minutes of Exercise per Session: Patient declined  Stress: No Stress Concern Present (11/11/2023)   [employer] of Occupational Health - Occupational Stress Questionnaire    Feeling of Stress : Only a little  Social Connections: Socially Integrated (12/30/2023)   Social  Connection and Isolation Panel [NHANES]    Frequency of Communication with Friends and Family: More than three times a week    Frequency of Social Gatherings with Friends and Family: More than three times a week    Attends Religious Services: More than 4 times per year    Active Member of [employer] or Organizations: Yes    Attends Engineer, structural: More than 4 times per year    Marital Status: Married  Recent Concern: Social Connections - Moderately Isolated (11/11/2023)   Social Connection and Isolation Panel [NHANES]    Frequency of Communication with Friends and Family: More than three times a week    Frequency of Social Gatherings with Friends and Family: Patient declined    Attends Religious Services: Never    Database administrator or Organizations: No    Attends Engineer, structural: Never    Marital Status: Married   Past Surgical History:  Procedure Laterality Date   ANTERIOR CERVICAL DECOMP/DISCECTOMY FUSION  07/07/2012   Procedure: ANTERIOR CERVICAL DECOMPRESSION/DISCECTOMY FUSION 2 LEVELS;  Surgeon: Adelbert Adler, MD;  Location: [facility];  Service: Neurosurgery;  Laterality: N/A;  Cervical four-five, five - six  Anterior cervical decompression/diskectomy/fusion/plate   APPENDECTOMY  11/04/1984   BOWEL RESECTION N/A 07/29/2013   Procedure: serosal repair;  Surgeon: Eddye Goodie, MD;  Location: [facility];  Service: General;  Laterality: N/A;   CARPAL TUNNEL RELEASE Bilateral    COLON SURGERY N/A    Phreesia 07/29/2020   LAPAROSCOPY N/A 07/29/2013   Procedure: diagnostic laporoscopy;  Surgeon: Eddye Goodie, MD;  Location: [facility];  Service: General;  Laterality: N/A;   LAPAROSCOPY N/A 08/16/2013   Procedure: LAPAROSCOPY DIAGNOSTIC/LYSIS OF ADHESIONS;  Surgeon: Eddye Goodie, MD;  Location: [facility];  Service: General;  Laterality: N/A;   LAPAROTOMY N/A 08/16/2013   Procedure: EXPLORATORY LAPAROTOMY/SMALL BOWEL RESECTION (JEJUNUM);  Surgeon: Eddye Goodie, MD;  Location: [facility];  Service: General;  Laterality: N/A;   LUMBAR SPINE SURGERY  11/04/2008   x 3   LYSIS OF ADHESION  11/04/2001   Dr. Merna Aase   LYSIS OF ADHESION N/A 07/29/2013   Procedure: LYSIS OF ADHESION;  Surgeon: Eddye Goodie, MD;  Location: [facility];  Service: General;  Laterality: N/A;   OOPHORECTOMY     PARTIAL HYSTERECTOMY  1990s?   [place], [place]   SPINAL CORD STIMULATOR IMPLANT     spine stimulator removal  12/13/2022   SPINE SURGERY N/A    Phreesia 07/29/2020   TRIGGER FINGER RELEASE  11/05/2007   right pinkie finger   TUBAL LIGATION  11/04/1992   Past Surgical History:  Procedure Laterality Date   ANTERIOR CERVICAL DECOMP/DISCECTOMY FUSION  07/07/2012   Procedure: ANTERIOR CERVICAL DECOMPRESSION/DISCECTOMY FUSION 2 LEVELS;  Surgeon: Adelbert Adler, MD;  Location: [facility];  Service: Neurosurgery;  Laterality: N/A;  Cervical four-five, five - six  Anterior cervical decompression/diskectomy/fusion/plate   APPENDECTOMY  11/04/1984   BOWEL RESECTION N/A 07/29/2013   Procedure: serosal repair;  Surgeon: Eddye Goodie, MD;  Location: [facility];  Service: General;  Laterality: N/A;   CARPAL TUNNEL RELEASE Bilateral    COLON SURGERY N/A    Phreesia 07/29/2020   LAPAROSCOPY N/A 07/29/2013   Procedure: diagnostic laporoscopy;  Surgeon: Eddye Goodie, MD;  Location: [facility];  Service: General;  Laterality: N/A;   LAPAROSCOPY N/A 08/16/2013   Procedure: LAPAROSCOPY DIAGNOSTIC/LYSIS OF ADHESIONS;  Surgeon: Eddye Goodie, MD;  Location: [facility];  Service: General;  Laterality: N/A;   LAPAROTOMY N/A 08/16/2013   Procedure: EXPLORATORY LAPAROTOMY/SMALL BOWEL RESECTION (JEJUNUM);  Surgeon: Eddye Goodie, MD;  Location: [facility];  Service: General;  Laterality: N/A;   LUMBAR SPINE SURGERY  11/04/2008   x 3   LYSIS OF ADHESION  11/04/2001   Dr. Merna Aase   LYSIS OF ADHESION N/A 07/29/2013   Procedure: LYSIS OF ADHESION;  Surgeon: Eddye Goodie, MD;   Location: [facility];  Service: General;  Laterality: N/A;   OOPHORECTOMY     PARTIAL HYSTERECTOMY  1990s?   [place], [place]   SPINAL CORD STIMULATOR IMPLANT     spine stimulator removal  12/13/2022   SPINE SURGERY N/A    Phreesia 07/29/2020   TRIGGER FINGER RELEASE  11/05/2007   right pinkie finger   TUBAL LIGATION  11/04/1992   Past Medical History:  Diagnosis Date   Anemia    Asthma    Asthma flare 04/09/2013   Back pain    Bronchitis    Chronic abdominal pain    Chronic constipation    Constipation due to opioid therapy    Depression    Depression, major, single episode, severe (HCC) 10/03/2018   PHQ 9 score of 15   Diabetes mellitus without complication (HCC)    Diabetes mellitus, type II (HCC)    DVT (deep venous thrombosis) (HCC) 2010   GERD (gastroesophageal reflux disease)    Heart murmur    no cardiologist   Helicobacter pylori gastritis 06/11/2013   Colonoscopy Dr. Bridgett Camps   Hypertension    IBS (irritable bowel syndrome)    Migraine headache    Neuropathy    Obesity    Obsessive-compulsive disorder    PSYCHOTIC D/O W/HALLUCINATIONS CONDS CLASS ELSW 03/04/2010   Qualifier: Diagnosis of  By: Rodolph Clap MD, Margaret     PTSD (post-traumatic stress disorder)    SBO (small bowel obstruction) (HCC) 08/09/2013   Seasonal allergies 12/10/2012   Seizures (HCC)    Shortness of breath    BP 119/74   Pulse 93   Ht 5\' 6"  (1.676 m)   SpO2 95%   BMI 28.63 kg/m   Opioid Risk Score:   Fall Risk Score:  `1  Depression screen [facility] 2/9     02/25/2024    9:11 AM 12/17/2023   10:41  AM 12/17/2023    9:39 AM 11/11/2023    8:03 AM 10/20/2023    9:05 AM 09/22/2023    2:43 PM 08/25/2023    3:03 PM  Depression screen PHQ 2/9  Decreased Interest 0 2 0 0 0 0 0  Down, Depressed, Hopeless 0 2 0 0 0 0 0  PHQ - 2 Score 0 4 0 0 0 0 0  Altered sleeping  3  0     Tired, decreased energy  2  0     Change in appetite  1  0     Feeling bad or failure about yourself   2  0     Trouble  concentrating  0  0     Moving slowly or fidgety/restless  0  0     Suicidal thoughts  0  0     PHQ-9 Score  12  0     Difficult doing work/chores  Somewhat difficult  Not difficult at all       Review of Systems  Musculoskeletal:  Positive for back pain.       Shoulder pain knee pain hand pain all bilaterally  All other systems reviewed and are negative.      Objective:   Physical Exam Vitals and nursing note reviewed.  Constitutional:      Appearance: Normal appearance.  Cardiovascular:     Rate and Rhythm: Normal rate and regular rhythm.     Pulses: Normal pulses.     Heart sounds: Normal heart sounds.  Pulmonary:     Effort: Pulmonary effort is normal.     Breath sounds: Normal breath sounds.  Musculoskeletal:     Comments: Normal Muscle Bulk and Muscle Testing Reveals:  Upper Extremities: Full ROM and Muscle Strength 5/5 Bilateral AC Joint Tenderness Lumbar Paraspinal Tenderness: L-4-L-5 Lower Extremities: Full ROM and Muscle Strength 5/5 Arises from Table with ease Narrow Based  Gait     Skin:    General: Skin is warm and dry.  Neurological:     Mental Status: She is alert and oriented to person, place, and time.  Psychiatric:        Mood and Affect: Mood normal.        Behavior: Behavior normal.         Assessment & Plan:  1.Chronic Pain of Bilateral Shoulders L>R: Continue to alternate Ice and Heat therapy. Continue HEP as Tolerated. Continue to Monitor. 02/25/2024 2. Failed Back Syndrome of Lumbar Syndrome : Continue HEP as Tolerated. Continue current medication regimen. Continue to Monitor. 02/25/2024 3. Lumbar Radiculitis: Continue HEP as Tolerated. Continue to Monitor.02/25/2024 4. Left Knee Pain: No Complaints today. Continue HEP as Tolerated. Continue to Monitor.02/25/2024 5. Muscle Spasm of Shoulders/ Upper Back : Continue Flexeril . Continue to Monitor.02/25/2024 6. Chronic Pain Syndrome: Refilled: Oxycodone  5mg /325 mg two tablets twice a day #120.  Second script sent for the following month. We will continue the opioid monitoring program, this consists of regular clinic visits, examinations, urine drug screen, pill counts as well as use of [place]  Controlled Substance Reporting system. A 12 month History has been reviewed on the [place]  Controlled Substance Reporting System on 02/25/2024. 7. Polyarthralgia: Continue HEP as Tolerated. Continue current medication regimen. Continue to monitor. 02/25/2024 8. Neuropathic Right Hand/ Neuropathic Left Hand: No complaints today. Continue current medication regimen. Continue to Monitor.    F/U in 2 months

## 2024-02-25 ENCOUNTER — Encounter: Payer: Self-pay | Admitting: Registered Nurse

## 2024-02-25 ENCOUNTER — Encounter: Payer: 59 | Attending: Physical Medicine and Rehabilitation | Admitting: Registered Nurse

## 2024-02-25 VITALS — BP 119/74 | HR 93 | Ht 66.0 in

## 2024-02-25 DIAGNOSIS — M2550 Pain in unspecified joint: Secondary | ICD-10-CM

## 2024-02-25 DIAGNOSIS — M25562 Pain in left knee: Secondary | ICD-10-CM

## 2024-02-25 DIAGNOSIS — M961 Postlaminectomy syndrome, not elsewhere classified: Secondary | ICD-10-CM

## 2024-02-25 DIAGNOSIS — M25561 Pain in right knee: Secondary | ICD-10-CM

## 2024-02-25 DIAGNOSIS — M5416 Radiculopathy, lumbar region: Secondary | ICD-10-CM | POA: Diagnosis not present

## 2024-02-25 DIAGNOSIS — Z5181 Encounter for therapeutic drug level monitoring: Secondary | ICD-10-CM

## 2024-02-25 DIAGNOSIS — M25512 Pain in left shoulder: Secondary | ICD-10-CM

## 2024-02-25 DIAGNOSIS — G894 Chronic pain syndrome: Secondary | ICD-10-CM

## 2024-02-25 DIAGNOSIS — G8929 Other chronic pain: Secondary | ICD-10-CM | POA: Insufficient documentation

## 2024-02-25 DIAGNOSIS — M25511 Pain in right shoulder: Secondary | ICD-10-CM | POA: Diagnosis not present

## 2024-02-25 DIAGNOSIS — Z79899 Other long term (current) drug therapy: Secondary | ICD-10-CM

## 2024-02-25 MED ORDER — OXYCODONE HCL 5 MG PO TABS: 10.0000 mg | ORAL_TABLET | Freq: Two times a day (BID) | ORAL | 0 refills | Status: DC

## 2024-02-28 LAB — DRUG TOX MONITOR 1 W/CONF, ORAL FLD
Amphetamines: NEGATIVE ng/mL (ref <10)
Barbiturates: NEGATIVE ng/mL (ref <10)
Benzodiazepines: NEGATIVE ng/mL (ref <0.50)
Buprenorphine: NEGATIVE ng/mL (ref <0.10)
Cocaine: NEGATIVE ng/mL (ref <5.0)
Codeine: NEGATIVE ng/mL (ref <2.5)
Dihydrocodeine: NEGATIVE ng/mL (ref <2.5)
Fentanyl: NEGATIVE ng/mL (ref <0.10)
Heroin Metabolite: NEGATIVE ng/mL (ref <1.0)
Hydrocodone: NEGATIVE ng/mL (ref <2.5)
Hydromorphone: NEGATIVE ng/mL (ref <2.5)
MARIJUANA: NEGATIVE ng/mL (ref <2.5)
MDMA: NEGATIVE ng/mL (ref <10)
Meprobamate: NEGATIVE ng/mL (ref <2.5)
Methadone: NEGATIVE ng/mL (ref <5.0)
Morphine: NEGATIVE ng/mL (ref <2.5)
Nicotine Metabolite: NEGATIVE ng/mL (ref <5.0)
Norhydrocodone: NEGATIVE ng/mL (ref <2.5)
Noroxycodone: NEGATIVE ng/mL (ref <2.5)
Opiates: POSITIVE ng/mL — AB (ref <2.5)
Oxycodone: 4 ng/mL — ABNORMAL HIGH (ref <2.5)
Oxymorphone: NEGATIVE ng/mL (ref <2.5)
Phencyclidine: NEGATIVE ng/mL (ref <10)
Tapentadol: NEGATIVE ng/mL (ref <5.0)
Tramadol: NEGATIVE ng/mL (ref <5.0)
Zolpidem: NEGATIVE ng/mL (ref <5.0)

## 2024-02-28 LAB — DRUG TOX ALC METAB W/CON, ORAL FLD: Alcohol Metabolite: NEGATIVE ng/mL (ref <25)

## 2024-03-07 ENCOUNTER — Other Ambulatory Visit: Payer: Self-pay | Admitting: Physical Medicine and Rehabilitation

## 2024-03-17 ENCOUNTER — Ambulatory Visit (INDEPENDENT_AMBULATORY_CARE_PROVIDER_SITE_OTHER): Payer: Medicare HMO | Admitting: Dermatology

## 2024-03-17 ENCOUNTER — Encounter: Payer: Self-pay | Admitting: Dermatology

## 2024-03-17 DIAGNOSIS — L659 Nonscarring hair loss, unspecified: Secondary | ICD-10-CM

## 2024-03-17 DIAGNOSIS — L649 Androgenic alopecia, unspecified: Secondary | ICD-10-CM

## 2024-03-17 DIAGNOSIS — Z79899 Other long term (current) drug therapy: Secondary | ICD-10-CM

## 2024-03-17 MED ORDER — SAFETY SEAL MISCELLANEOUS MISC: 1.0000 | Freq: Every morning | 8 refills | Status: DC

## 2024-03-17 NOTE — Patient Instructions (Addendum)
 Date: Wed Mar 17 2024  Hello Jane English,  Thank you for visiting today. Here is a summary of the key instructions:  Medications and Supplements: - Continue taking Nutrafol, 4 pills daily - Continue using collagen supplement   - Use the new minoxidil-finasteride-clobetasol combination topical medication daily   - Apply throughout the scalp, focusing on thinning areas  Hair Care: - Try using Love Your Curls serum for curl management - Use foam without water to refresh curls in the morning   - Consider trying [name] or Pattern by Jane English foam - Continue using Raw Sugar shampoo (green bottle) with rosemary vinegar - Avoid using hair dye - Be careful with headbands and hair accessories to prevent friction and breakage  Follow-up: - Return for a follow-up appointment in 6 months - Contact the office sooner if any issues arise  Please reach out if you have any questions or concerns.  Warm regards,  Dr. Louana Roup [facility]at[facility]     Important Information   Due to recent changes in healthcare laws, you may see results of your pathology and/or laboratory studies on MyChart before the doctors have had a chance to review them. We understand that in some cases there may be results that are confusing or concerning to you. Please understand that not all results are received at the same time and often the doctors may need to interpret multiple results in order to provide you with the best plan of care or course of treatment. Therefore, we ask that you please give us  2 business days to thoroughly review all your results before contacting the office for clarification. Should we see a critical lab result, you will be contacted sooner.     If You Need Anything After Your Visit   If you have any questions or concerns for your doctor, please call our main line at [phone]. If no one answers, please leave a voicemail as directed and we will return your call as  soon as possible. Messages left after 4 pm will be answered the following business day.    You may also send us  a message via MyChart. We typically respond to MyChart messages within 1-2 business days.  For prescription refills, please ask your pharmacy to contact our office. Our fax number is [phone].  If you have an urgent issue when the clinic is closed that cannot wait until the next business day, you can page your doctor at the number below.     Please note that while we do our best to be available for urgent issues outside of office hours, we are not available 24/7.    If you have an urgent issue and are unable to reach us , you may choose to seek medical care at your doctor's office, retail clinic, urgent care center, or emergency room.   If you have a medical emergency, please immediately call 911 or go to the emergency department. In the event of inclement weather, please call our main line at [phone] for an update on the status of any delays or closures.  Dermatology Medication Tips: Please keep the boxes that topical medications come in in order to help keep track of the instructions about where and how to use these. Pharmacies typically print the medication instructions only on the boxes and not directly on the medication tubes.   If your medication is too expensive, please contact our office at [phone] or send us  a message through MyChart.    We  are unable to tell what your co-pay for medications will be in advance as this is different depending on your insurance coverage. However, we may be able to find a substitute medication at lower cost or fill out paperwork to get insurance to cover a needed medication.    If a prior authorization is required to get your medication covered by your insurance company, please allow us  1-2 business days to complete this process.   Drug prices often vary depending on where the prescription is filled and some pharmacies may offer  cheaper prices.   The website www.goodrx.com contains coupons for medications through different pharmacies. The prices here do not account for what the cost may be with help from insurance (it may be cheaper with your insurance), but the website can give you the price if you did not use any insurance.  - You can print the associated coupon and take it with your prescription to the pharmacy.  - You may also stop by our office during regular business hours and pick up a GoodRx coupon card.  - If you need your prescription sent electronically to a different pharmacy, notify our office through [facility] or by phone at [phone]

## 2024-03-17 NOTE — Progress Notes (Unsigned)
   Follow-Up Visit   Subjective  Jane English is a 53 y.o. female who presents for the following: Androgenetic Alopecia  Patient present today for follow up visit for Androgenetic Alopecia. Patient was last evaluated on 09/18/23. At this visit patient was prescribed AA Gel and recommended to take OTC Supplements (Vivascal and Vital Protein). Patient reports sxs are improvingbut she is feels it is taking a extremely long time to see the progress she is wanting to see. Patient denies medication changes.  The following portions of the chart were reviewed this encounter and updated as appropriate: medications, allergies, medical history  Review of Systems:  No other skin or systemic complaints except as noted in HPI or Assessment and Plan.  Objective  Well appearing patient in no apparent distress; mood and affect are within normal limits.  A focused examination was performed of the following areas: Scalp  Relevant exam findings are noted in the Assessment and Plan.           Assessment & Plan   ANDROGENETIC ALOPECIA (FEMALE PATTERN HAIR LOSS) Exam: Diffuse thinning of the crown and widening of the midline part with retention of the frontal hairline  Improving   Treatment Plan: - Continue AA Gel application every Morning, add Finasteride to the compound, New RX sent to [facility]  - Continue OTC Vivscal and Vital Protein - Advised to be mindful of wearing head bands to prevent tension to the new hair growth  - Plan to follow up in 6 months  Long term medication management.  Patient is using long term (months to years) prescription medication  to control their dermatologic condition.  These medications require periodic monitoring to evaluate for efficacy and side effects and may require periodic laboratory monitoring.   ALOPECIA   Related Medications Safety Seal Miscellaneous MISC Apply 1 Application topically in the morning. MEDICATION NAME: AA gel (Minoxidil 10%, Clobetasol  0.05%, Finasteride 0.05%)  Return in about 6 months (around 09/17/2024) for Androgenetic Alopecia F/U.  I, Jetta Ager, am acting as Neurosurgeon for [employer], DO.  Documentation: I have reviewed the above documentation for accuracy and completeness, and I agree with the above.  Louana Roup, DO

## 2024-03-22 ENCOUNTER — Ambulatory Visit (INDEPENDENT_AMBULATORY_CARE_PROVIDER_SITE_OTHER): Payer: 59 | Admitting: Pulmonary Disease

## 2024-03-22 ENCOUNTER — Encounter (HOSPITAL_BASED_OUTPATIENT_CLINIC_OR_DEPARTMENT_OTHER): Payer: Self-pay | Admitting: Pulmonary Disease

## 2024-03-22 VITALS — BP 149/85 | HR 88 | Ht 66.0 in | Wt 176.0 lb

## 2024-03-22 DIAGNOSIS — J4540 Moderate persistent asthma, uncomplicated: Secondary | ICD-10-CM | POA: Diagnosis not present

## 2024-03-22 DIAGNOSIS — J329 Chronic sinusitis, unspecified: Secondary | ICD-10-CM

## 2024-03-22 DIAGNOSIS — K219 Gastro-esophageal reflux disease without esophagitis: Secondary | ICD-10-CM

## 2024-03-22 MED ORDER — PANTOPRAZOLE SODIUM 40 MG PO TBEC: 40.0000 mg | DELAYED_RELEASE_TABLET | Freq: Every day | ORAL | 1 refills | Status: DC

## 2024-03-22 NOTE — Patient Instructions (Signed)
 X Blood work - RAST panel Referral toa llergist  Kepp appt with ENT  X rx for protonix   X spirometry

## 2024-03-22 NOTE — Progress Notes (Signed)
 Subjective:    Patient ID: Jane English, female    DOB: May 04, 1971, 53 y.o.   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 She was referred by Dr. Alberteen Huge for evaluation of her asthma.  She experiences shortness of breath, nasal congestion, and a persistent cough since February, with symptoms worsening in March. There is increased difficulty breathing, nasal congestion, and anosmia. Multiple chest x-rays and a scan have not clarified the cause. Treatment with Atanacio Blanch pills, prednisone , cough syrup, and antibiotics has been ineffective.  Her morning cough is thick and mucoid with occasional hemoptysis. Nasal congestion is severe, particularly in the left nostril, which is swollen and obstructed. She uses a cool humidifier, air purifiers, and sleeps with a fan. Wheezing and shortness of breath occur during physical activities and exposure to smoke or allergens.  Her asthma has worsened, requiring multiple inhalers. She uses Breo and albuterol  inhalers, with the blue inhaler for emergencies. Breo is used two to three times daily, sometimes more, and she finds the device difficult to use. There is a family history of asthma.  She is allergic to multiple environmental factors, including dogs, but refuses to part with her two dogs, an [employer] bully and a [employer]. She has seen an allergist and is scheduled to see an ENT doctor in June for further evaluation of her sinus issues.  PMH -  chronic pain Perennial allergies GERD   Significant tests/ events reviewed  12/2023 AEC 500  Spiro 07/2020 mod restriction , ratio 89, FEV1 64%,FVC 58 % 04/2020 spiro >> mod restriction  CTA 12/2023 neg CT sinus 01/2024 >> BL max, ethmoid sinusisits  Past Medical History:  Diagnosis Date   Anemia    Asthma    Asthma flare 04/09/2013   Back pain    Bronchitis    Chronic abdominal pain    Chronic  constipation    Constipation due to opioid therapy    Depression    Depression, major, single episode, severe (HCC) 10/03/2018   PHQ 9 score of 15   Diabetes mellitus without complication (HCC)    Diabetes mellitus, type II (HCC)    DVT (deep venous thrombosis) (HCC) 2010   GERD (gastroesophageal reflux disease)    Heart murmur    no cardiologist   Helicobacter pylori gastritis 06/11/2013   Colonoscopy Dr. Bridgett Camps   Hypertension    IBS (irritable bowel syndrome)    Migraine headache    Neuropathy    Obesity    Obsessive-compulsive disorder    PSYCHOTIC D/O W/HALLUCINATIONS CONDS CLASS ELSW 03/04/2010   Qualifier: Diagnosis of  By: Rodolph Clap MD, Margaret     PTSD (post-traumatic stress disorder)    SBO (small bowel obstruction) (HCC) 08/09/2013   Seasonal allergies 12/10/2012   Seizures (HCC)    Shortness of breath    Past Surgical History:  Procedure Laterality Date   ANTERIOR CERVICAL DECOMP/DISCECTOMY FUSION  07/07/2012   Procedure: ANTERIOR CERVICAL DECOMPRESSION/DISCECTOMY FUSION 2 LEVELS;  Surgeon: Adelbert Adler, MD;  Location: [facility];  Service: Neurosurgery;  Laterality: N/A;  Cervical four-five, five - six  Anterior cervical decompression/diskectomy/fusion/plate   APPENDECTOMY  11/04/1984   BOWEL RESECTION N/A 07/29/2013   Procedure: serosal repair;  Surgeon: Eddye Goodie, MD;  Location: [facility];  Service: General;  Laterality: N/A;   CARPAL TUNNEL RELEASE Bilateral    COLON SURGERY N/A  [employer]rees[employer] 07/29/2020   LAPAROSCOPY N/A 07/29/2013   Procedure: diagnostic laporoscopy;  Surgeon: Eddye Goodie, MD;  Location: [facility];  Service: General;  Laterality: N/A;   LAPAROSCOPY N/A 08/16/2013   Procedure: LAPAROSCOPY DIAGNOSTIC/LYSIS OF ADHESIONS;  Surgeon: Eddye Goodie, MD;  Location: [facility];  Service: General;  Laterality: N/A;   LAPAROTOMY N/A 08/16/2013   Procedure: EXPLORATORY LAPAROTOMY/SMALL BOWEL RESECTION (JEJUNUM);  Surgeon: Eddye Goodie, MD;  Location: [facility];  Service: General;  Laterality: N/A;   LUMBAR SPINE SURGERY  11/04/2008   x 3   LYSIS OF ADHESION  11/04/2001   Dr. Merna Aase   LYSIS OF ADHESION N/A 07/29/2013   Procedure: LYSIS OF ADHESION;  Surgeon: Eddye Goodie, MD;  Location: [facility];  Service: General;  Laterality: N/A;   OOPHORECTOMY     PARTIAL HYSTERECTOMY  1990s?   [place], [place]   SPINAL CORD STIMULATOR IMPLANT     spine stimulator removal  12/13/2022   SPINE SURGERY N/A    [employer]rees[employer] 07/29/2020   TRIGGER FINGER RELEASE  11/05/2007   right pinkie finger   TUBAL LIGATION  11/04/1992    Allergies  Allergen Reactions   Neurontin [Gabapentin] Nausea And Vomiting and Other (See Comments)    Sleep walking/hallucinations    Penicillins Hives, Shortness Of Breath and Other (See Comments)    Has patient had a PCN reaction causing immediate rash, facial/tongue/throat swelling, SOB or lightheadedness with hypotension: Yes Has patient had a PCN reaction causing severe rash involving mucus membranes or skin necrosis: No Has patient had a PCN reaction that required hospitalization: No Has patient had a PCN reaction occurring within the last 10 years: Yes If all of the above answers are "NO", then may proceed with Cephalosporin use.   Pregabalin Anaphylaxis, Shortness Of Breath, Diarrhea and Swelling    Lyrica   Tramadol  Other (See Comments)    Makes patient "delusional"   Latex Rash    Social History   Socioeconomic History   Marital status: Legally Separated    Spouse name: Porfirio Bristol   Number of children: 4   Years of education: 12   Highest education level: GED or equivalent  Occupational History   Occupation: disabled  Tobacco Use   Smoking status: Never    Passive exposure: Never   Smokeless tobacco: Never  Vaping Use   Vaping status: Never Used  Substance and Sexual Activity   Alcohol use: No   Drug use: No   Sexual activity: Not Currently    Partners: Male    Birth control/protection: Surgical     Comment: tubal  Other Topics Concern   Not on file  Social History Narrative   Not on file   Social Drivers of Health   Financial Resource Strain: Low Risk  (12/30/2023)   Overall Financial Resource Strain (CARDIA)    Difficulty of Paying Living Expenses: Not hard at all  Food Insecurity: Unknown (12/30/2023)   Hunger Vital Sign    Worried About Running Out of Food in the Last Year: Never true    Ran Out of Food in the Last Year: Patient declined  Transportation Needs: No Transportation Needs (12/30/2023)   PRAPARE - Administrator, Civil Service (Medical): No    Lack of Transportation (Non-Medical): No  Physical Activity: Patient Declined (11/11/2023)   Exercise Vital Sign    Days of Exercise per Week: Patient declined    Minutes of Exercise per Session: Patient declined  Stress: No  Stress Concern Present (11/11/2023)   [employer] of Occupational Health - Occupational Stress Questionnaire    Feeling of Stress : Only a little  Social Connections: Socially Integrated (12/30/2023)   Social Connection and Isolation Panel [NHANES]    Frequency of Communication with Friends and Family: More than three times a week    Frequency of Social Gatherings with Friends and Family: More than three times a week    Attends Religious Services: More than 4 times per year    Active Member of [employer] or Organizations: Yes    Attends Engineer, structural: More than 4 times per year    Marital Status: Married  Recent Concern: Social Connections - Moderately Isolated (11/11/2023)   Social Connection and Isolation Panel [NHANES]    Frequency of Communication with Friends and Family: More than three times a week    Frequency of Social Gatherings with Friends and Family: Patient declined    Attends Religious Services: Never    Database administrator or Organizations: No    Attends Banker Meetings: Never    Marital Status: Married  Catering manager Violence: Not At Risk  (11/11/2023)   Humiliation, Afraid, Rape, and Kick questionnaire    Fear of Current or Ex-Partner: No    Emotionally Abused: No    Physically Abused: No    Sexually Abused: No    Family History  Problem Relation Age of Onset   Physical abuse Mother    Alcohol abuse Mother    Cirrhosis Mother    Lung cancer Father    Stomach cancer Father    Esophageal cancer Father    Alcohol abuse Father    Mental illness Father    Diabetes Sister    Hypertension Sister    Bipolar disorder Sister    Schizophrenia Sister    Diabetes Sister    Drug abuse Sister    HIV Sister    Pneumonia Sister        died as a baby   Alcohol abuse Brother    Hypertension Brother    Kidney disease Brother    Diabetes Brother    Drug abuse Brother    Mental illness Brother    Alcohol abuse Brother    Alcohol abuse Brother    Hypertension Brother    Diabetes Brother    Alcohol abuse Brother    Mental illness Brother        in [place]   Alcohol abuse Brother    Alcohol abuse Brother    Bipolar disorder Brother    Hypertension Brother    Bipolar disorder Brother    Drug abuse Brother    Alcohol abuse Brother    Bipolar disorder Brother    Bipolar disorder Daughter    Bipolar disorder Son    Bipolar disorder Son    ADD / ADHD Neg Hx    Anxiety disorder Neg Hx    Dementia Neg Hx    Depression Neg Hx    OCD Neg Hx    Seizures Neg Hx    Paranoid behavior Neg Hx    Colon cancer Neg Hx    Rectal cancer Neg Hx      Review of Systems Constitutional: negative for anorexia, fevers and sweats  Eyes: negative for irritation, redness and visual disturbance  Ears, nose, mouth, throat, and face: negative for earaches, epistaxis, nasal congestion and sore throat   Cardiovascular: negative for chest pain, dyspnea, lower extremity edema, orthopnea, palpitations and  syncope  Gastrointestinal: negative for abdominal pain, constipation, diarrhea, melena, nausea and vomiting  Genitourinary:negative for dysuria,  frequency and hematuria  Hematologic/lymphatic: negative for bleeding, easy bruising and lymphadenopathy  Musculoskeletal:negative for arthralgias, muscle weakness and stiff joints  Neurological: negative for coordination problems, gait problems, headaches and weakness  Endocrine: negative for diabetic symptoms including polydipsia, polyuria and weight loss     Objective:   Physical Exam  Gen. Pleasant, obese, in no distress, normal affect ENT - no pallor,icterus, no post nasal drip, class 2-3 airway Neck: No JVD, no thyromegaly, no carotid bruits Lungs: no use of accessory muscles, no dullness to percussion, decreased without rales or rhonchi  Cardiovascular: Rhythm regular, heart sounds  normal, no murmurs or gallops, no peripheral edema Abdomen: soft and non-tender, no hepatosplenomegaly, BS normal. Musculoskeletal: No deformities, no cyanosis or clubbing Neuro:  alert, non focal, no tremors       Assessment & Plan:   Moderate persistent asthma Moderate persistent asthma exacerbated by allergies, sinusitis, and GERD. Symptoms include dyspnea, wheezing, and nasal congestion, triggered by environmental factors such as smoke, dust, and possibly pet dander. She uses multiple inhalers, including Breo and albuterol , and frequently requires prednisone . Current regimen may need adjustment based on lung function evaluation. Control of asthma triggers is essential to prevent exacerbations. - Continue Breo once daily. - Continue albuterol  as needed for acute symptoms. - Order spirometry pre and post bronchodilator to assess lung function. - Refer to allergist for further evaluation and management of allergies. - Educate on avoiding known asthma triggers, including smoke and dust.  Chronic sinusitis Chronic sinusitis with nasal congestion, post-nasal drip, and sinus pressure, potentially contributing to asthma exacerbations. Previous imaging showed sinus fullness. She uses nasal sprays and  has an upcoming ENT appointment for further evaluation. - Continue using Flonase  nasal spray. - Attend ENT appointment on June 2 for further evaluation of sinusitis. - sinus imaging reviewed  Gastroesophageal reflux disease (GERD) GERD with heartburn exacerbated by spicy foods and certain condiments, potentially contributing to asthma symptoms. She reports previous benefit from Protonix . - Prescribe Protonix  for GERD management. - Educate on dietary modifications to avoid GERD triggers, such as spicy foods and acidic condiments.

## 2024-03-23 ENCOUNTER — Ambulatory Visit: Payer: Medicare HMO | Admitting: Physical Medicine and Rehabilitation

## 2024-03-23 DIAGNOSIS — I8312 Varicose veins of left lower extremity with inflammation: Secondary | ICD-10-CM | POA: Diagnosis not present

## 2024-03-23 DIAGNOSIS — I8311 Varicose veins of right lower extremity with inflammation: Secondary | ICD-10-CM | POA: Diagnosis not present

## 2024-03-23 DIAGNOSIS — R600 Localized edema: Secondary | ICD-10-CM | POA: Diagnosis not present

## 2024-03-23 DIAGNOSIS — R252 Cramp and spasm: Secondary | ICD-10-CM | POA: Diagnosis not present

## 2024-03-23 DIAGNOSIS — I87393 Chronic venous hypertension (idiopathic) with other complications of bilateral lower extremity: Secondary | ICD-10-CM | POA: Diagnosis not present

## 2024-03-24 ENCOUNTER — Ambulatory Visit: Payer: Self-pay | Admitting: Pulmonary Disease

## 2024-03-24 LAB — ALLERGENS W/TOTAL IGE AREA 2
Alternaria Alternata IgE: <0.1 kU/L
Aspergillus Fumigatus IgE: <0.1 kU/L
Bermuda Grass IgE: <0.1 kU/L
Cat Dander IgE: 6.29 kU/L — AB
Cedar, Mountain IgE: 0.1 kU/L — AB
Cladosporium Herbarum IgE: <0.1 kU/L
Cockroach, German IgE: <0.1 kU/L
Common Silver Birch IgE: <0.1 kU/L
Cottonwood IgE: 0.41 kU/L — AB
D Farinae IgE: <0.1 kU/L
D Pteronyssinus IgE: 0.14 kU/L — AB
Dog Dander IgE: 12 kU/L — AB
Elm, American IgE: <0.1 kU/L
IgE (Immunoglobulin E), Serum: 276 [IU]/mL (ref 6–495)
Johnson Grass IgE: <0.1 kU/L
Maple/Box Elder IgE: 0.41 kU/L — AB
Mouse Urine IgE: <0.1 kU/L
Oak, White IgE: <0.1 kU/L
Pecan, Hickory IgE: <0.1 kU/L
Penicillium Chrysogen IgE: <0.1 kU/L
Pigweed, Rough IgE: <0.1 kU/L
Ragweed, Short IgE: <0.1 kU/L
Sheep Sorrel IgE Qn: <0.1 kU/L
Timothy Grass IgE: <0.1 kU/L
White Mulberry IgE: <0.1 kU/L

## 2024-03-24 NOTE — Telephone Encounter (Signed)
 FYI from pt

## 2024-03-25 ENCOUNTER — Encounter: Payer: Self-pay | Admitting: Family Medicine

## 2024-03-25 ENCOUNTER — Ambulatory Visit (INDEPENDENT_AMBULATORY_CARE_PROVIDER_SITE_OTHER): Payer: 59 | Admitting: Family Medicine

## 2024-03-25 VITALS — BP 160/100 | HR 86 | Resp 18 | Ht 66.0 in | Wt 177.0 lb

## 2024-03-25 DIAGNOSIS — R079 Chest pain, unspecified: Secondary | ICD-10-CM

## 2024-03-25 DIAGNOSIS — F322 Major depressive disorder, single episode, severe without psychotic features: Secondary | ICD-10-CM | POA: Diagnosis not present

## 2024-03-25 DIAGNOSIS — E1169 Type 2 diabetes mellitus with other specified complication: Secondary | ICD-10-CM | POA: Diagnosis not present

## 2024-03-25 DIAGNOSIS — I10 Essential (primary) hypertension: Secondary | ICD-10-CM

## 2024-03-25 DIAGNOSIS — F419 Anxiety disorder, unspecified: Secondary | ICD-10-CM

## 2024-03-25 DIAGNOSIS — E785 Hyperlipidemia, unspecified: Secondary | ICD-10-CM

## 2024-03-25 DIAGNOSIS — L608 Other nail disorders: Secondary | ICD-10-CM

## 2024-03-25 MED ORDER — TIRZEPATIDE 5 MG/0.5ML ~~LOC~~ SOAJ: 5.0000 mg | SUBCUTANEOUS | 5 refills | Status: DC

## 2024-03-25 MED ORDER — PAROXETINE HCL 20 MG PO TABS: 20.0000 mg | ORAL_TABLET | Freq: Every day | ORAL | 3 refills | Status: DC

## 2024-03-25 MED ORDER — BUSPIRONE HCL 5 MG PO TABS
5.0000 mg | ORAL_TABLET | Freq: Three times a day (TID) | ORAL | 3 refills | Status: AC
Start: 2024-03-25 — End: ?

## 2024-03-25 MED ORDER — OLMESARTAN MEDOXOMIL-HCTZ 20-12.5 MG PO TABS: 1.0000 | ORAL_TABLET | Freq: Every day | ORAL | 1 refills | Status: DC

## 2024-03-25 NOTE — Assessment & Plan Note (Addendum)
 Uncontrolled, increase med dose and refer to new therapist of pt's choice if she will  be accepted

## 2024-03-25 NOTE — Assessment & Plan Note (Signed)
 Hyperlipidemia:Low fat diet discussed and encouraged.   Lipid Panel  Lab Results  Component Value Date   CHOL 204 (H) 07/01/2023   HDL 66 07/01/2023   LDLCALC 94 07/01/2023   TRIG 268 (H) 07/01/2023   CHOLHDL 3.1 07/01/2023     Updated lab needed at/ before next visit.

## 2024-03-25 NOTE — Assessment & Plan Note (Signed)
 DASH diet and commitment to daily physical activity for a minimum of 30 minutes discussed and encouraged, as a part of hypertension management. The importance of attaining a healthy weight is also discussed.     03/25/2024   10:11 AM 03/25/2024    9:34 AM 03/22/2024    8:59 AM 03/17/2024    3:37 PM 02/25/2024    8:53 AM 01/30/2024   11:10 AM 12/31/2023    3:18 PM  BP/Weight  Systolic BP 160 156 149 161 119 144 130  Diastolic BP 100 92 85 101 74 75 74  Wt. (Lbs)  177 176   177.4 166  BMI  28.57 kg/m2 28.41 kg/m2   28.63 kg/m2 26.79 kg/m2     Add benicar/hctz

## 2024-03-25 NOTE — Patient Instructions (Addendum)
 Follow-up in 4, reevaluate blood pressure.and chronic probs  Call if you need me sooner.  New for blood pressure is olmesartan HCTZ 20/12.51 daily.  Continue clonidine  as before.  Labs today as ordered, lipid, cmp and eGFr, Magnesium    You are referred to dermatology regarding toenails.  You are referred to a new therapist per your request.  Mounjaro  is sent to that the same dose of 5 mg/week.  EKG in office today due to complaint of intermittent chest pain and uncontrolled blood pressure.  New for depression and anxiety is a higher dose of Paxil  20 mg daily and BuSpar  5 mg 3 times daily.  Added please take this 8 hours apart as much as possible as an 7 AM 3 PM 10 PM  You will be referred to Podiatry due to right toe pain  Thanks for choosing [facility], we consider it a privelige to serve you.

## 2024-03-25 NOTE — Assessment & Plan Note (Addendum)
 Uncontrolled requests  specific therapist, score of 12 03/2024 Needs to commit to taking buspar  as prescribed more regularly on a schedule

## 2024-03-26 ENCOUNTER — Ambulatory Visit: Payer: Self-pay | Admitting: Family Medicine

## 2024-03-26 LAB — CMP14+EGFR
ALT: 25 IU/L (ref 0–32)
AST: 23 IU/L (ref 0–40)
Albumin: 4.5 g/dL (ref 3.8–4.9)
Alkaline Phosphatase: 107 IU/L (ref 44–121)
BUN/Creatinine Ratio: 12 (ref 9–23)
BUN: 10 mg/dL (ref 6–24)
Bilirubin Total: 0.4 mg/dL (ref 0.0–1.2)
CO2: 23 mmol/L (ref 20–29)
Calcium: 9.1 mg/dL (ref 8.7–10.2)
Chloride: 102 mmol/L (ref 96–106)
Creatinine, Ser: 0.83 mg/dL (ref 0.57–1.00)
Globulin, Total: 2.3 g/dL (ref 1.5–4.5)
Glucose: 80 mg/dL (ref 70–99)
Potassium: 3.4 mmol/L — ABNORMAL LOW (ref 3.5–5.2)
Sodium: 142 mmol/L (ref 134–144)
Total Protein: 6.8 g/dL (ref 6.0–8.5)
eGFR: 83 mL/min/{1.73_m2} (ref >59)

## 2024-03-26 LAB — LIPID PANEL W/O CHOL/HDL RATIO
Cholesterol, Total: 141 mg/dL (ref 100–199)
HDL: 63 mg/dL (ref >39)
LDL Chol Calc (NIH): 64 mg/dL (ref 0–99)
Triglycerides: 69 mg/dL (ref 0–149)
VLDL Cholesterol Cal: 14 mg/dL (ref 5–40)

## 2024-03-26 LAB — MAGNESIUM: Magnesium: 1.8 mg/dL (ref 1.6–2.3)

## 2024-04-02 ENCOUNTER — Ambulatory Visit: Admitting: Family Medicine

## 2024-04-02 ENCOUNTER — Other Ambulatory Visit: Payer: Self-pay | Admitting: Physical Medicine and Rehabilitation

## 2024-04-05 ENCOUNTER — Institutional Professional Consult (permissible substitution) (INDEPENDENT_AMBULATORY_CARE_PROVIDER_SITE_OTHER): Admitting: Otolaryngology

## 2024-04-05 ENCOUNTER — Encounter: Payer: Self-pay | Admitting: Family Medicine

## 2024-04-05 DIAGNOSIS — R079 Chest pain, unspecified: Secondary | ICD-10-CM | POA: Insufficient documentation

## 2024-04-05 NOTE — Assessment & Plan Note (Signed)
 Intermittent chest pain, left sided x 4 weeks, thinks stress related, but at increased risk for CAD based on co morbidities Office EKG: NSR , no ischemia , no LVH , no change from prior

## 2024-04-05 NOTE — Assessment & Plan Note (Signed)
 Diabetes associated with hypertension, hyperlipidemia, and depression  Jane English is reminded of the importance of commitment to daily physical activity for 30 minutes or more, as able and the need to limit carbohydrate intake to 30 to 60 grams per meal to help with blood sugar control.  Continue current med dose  The need to take medication as prescribed, test blood sugar as directed, and to call between visits if there is a concern that blood sugar is uncontrolled is also discussed.   Jane English is reminded of the importance of daily foot exam, annual eye examination, and good blood sugar, blood pressure and cholesterol control.     Latest Ref Rng & Units 03/25/2024   10:44 AM 12/22/2023    3:06 PM 12/17/2023   10:36 AM 07/01/2023    9:28 AM 03/12/2023   10:57 AM  Diabetic Labs  HbA1c 4.8 - 5.6 %   5.9  6.0  5.9   Micro/Creat Ratio 0 - 29 mg/g creat   5     Chol 100 - 199 mg/dL 161    096  045   HDL >40 mg/dL 63    66  62   Calc LDL 0 - 99 mg/dL 64    94  981   Triglycerides 0 - 149 mg/dL 69    191  71   Creatinine 0.57 - 1.00 mg/dL 4.78  2.95  6.21  3.08  1.07       03/25/2024   10:11 AM 03/25/2024    9:34 AM 03/22/2024    8:59 AM 03/17/2024    3:37 PM 02/25/2024    8:53 AM 01/30/2024   11:10 AM 12/31/2023    3:18 PM  BP/Weight  Systolic BP 160 156 149 161 119 144 130  Diastolic BP 100 92 85 101 74 75 74  Wt. (Lbs)  177 176   177.4 166  BMI  28.57 kg/m2 28.41 kg/m2   28.63 kg/m2 26.79 kg/m2      Latest Ref Rng & Units 12/17/2023    9:40 AM 07/10/2023   12:00 AM  Foot/eye exam completion dates  Eye Exam No Retinopathy  No Retinopathy      Foot Form Completion  Done      This result is from an external source.

## 2024-04-05 NOTE — Progress Notes (Deleted)
 ENT CONSULT:  Reason for Consult: chronic nasal congestion    HPI: Discussed the use of AI scribe software for clinical note transcription with the patient, who gave verbal consent to proceed.  History of Present Illness      Records Reviewed:  ***    Past Medical History:  Diagnosis Date   Anemia    Asthma    Asthma flare 04/09/2013   Back pain    Bronchitis    Chronic abdominal pain    Chronic constipation    Constipation due to opioid therapy    Depression    Depression, major, single episode, severe (HCC) 10/03/2018   PHQ 9 score of 15   Diabetes mellitus without complication (HCC)    Diabetes mellitus, type II (HCC)    DVT (deep venous thrombosis) (HCC) 2010   GERD (gastroesophageal reflux disease)    Heart murmur    no cardiologist   Helicobacter pylori gastritis 06/11/2013   Colonoscopy Dr. Bridgett Camps   Hypertension    IBS (irritable bowel syndrome)    Migraine headache    Neuropathy    Obesity    Obsessive-compulsive disorder    PSYCHOTIC D/O W/HALLUCINATIONS CONDS CLASS ELSW 03/04/2010   Qualifier: Diagnosis of  By: Rodolph Clap MD, Margaret     PTSD (post-traumatic stress disorder)    SBO (small bowel obstruction) (HCC) 08/09/2013   Seasonal allergies 12/10/2012   Seizures (HCC)    Shortness of breath     Past Surgical History:  Procedure Laterality Date   ANTERIOR CERVICAL DECOMP/DISCECTOMY FUSION  07/07/2012   Procedure: ANTERIOR CERVICAL DECOMPRESSION/DISCECTOMY FUSION 2 LEVELS;  Surgeon: Adelbert Adler, MD;  Location: [facility];  Service: Neurosurgery;  Laterality: N/A;  Cervical four-five, five - six  Anterior cervical decompression/diskectomy/fusion/plate   APPENDECTOMY  11/04/1984   BOWEL RESECTION N/A 07/29/2013   Procedure: serosal repair;  Surgeon: Eddye Goodie, MD;  Location: [facility];  Service: General;  Laterality: N/A;   CARPAL TUNNEL RELEASE Bilateral    COLON SURGERY N/A    [employer] 07/29/2020   LAPAROSCOPY N/A 07/29/2013   Procedure:  diagnostic laporoscopy;  Surgeon: Eddye Goodie, MD;  Location: [facility];  Service: General;  Laterality: N/A;   LAPAROSCOPY N/A 08/16/2013   Procedure: LAPAROSCOPY DIAGNOSTIC/LYSIS OF ADHESIONS;  Surgeon: Eddye Goodie, MD;  Location: [facility];  Service: General;  Laterality: N/A;   LAPAROTOMY N/A 08/16/2013   Procedure: EXPLORATORY LAPAROTOMY/SMALL BOWEL RESECTION (JEJUNUM);  Surgeon: Eddye Goodie, MD;  Location: [facility];  Service: General;  Laterality: N/A;   LUMBAR SPINE SURGERY  11/04/2008   x 3   LYSIS OF ADHESION  11/04/2001   Dr. Merna Aase   LYSIS OF ADHESION N/A 07/29/2013   Procedure: LYSIS OF ADHESION;  Surgeon: Eddye Goodie, MD;  Location: [facility];  Service: General;  Laterality: N/A;   OOPHORECTOMY     PARTIAL HYSTERECTOMY  1990s?   [place], [place]   SPINAL CORD STIMULATOR IMPLANT     spine stimulator removal  12/13/2022   SPINE SURGERY N/A    [employer] 07/29/2020   TRIGGER FINGER RELEASE  11/05/2007   right pinkie finger   TUBAL LIGATION  11/04/1992    Family History  Problem Relation Age of Onset   Physical abuse Mother    Alcohol abuse Mother    Cirrhosis Mother    Lung cancer Father    Stomach cancer Father    Esophageal cancer Father    Alcohol abuse Father  Mental illness Father    Diabetes Sister    Hypertension Sister    Bipolar disorder Sister    Schizophrenia Sister    Diabetes Sister    Drug abuse Sister    HIV Sister    Pneumonia Sister        died as a baby   Alcohol abuse Brother    Hypertension Brother    Kidney disease Brother    Diabetes Brother    Drug abuse Brother    Mental illness Brother    Alcohol abuse Brother    Alcohol abuse Brother    Hypertension Brother    Diabetes Brother    Alcohol abuse Brother    Mental illness Brother        in [place]   Alcohol abuse Brother    Alcohol abuse Brother    Bipolar disorder Brother    Hypertension Brother    Bipolar disorder Brother    Drug abuse Brother    Alcohol abuse  Brother    Bipolar disorder Brother    Bipolar disorder Daughter    Bipolar disorder Son    Bipolar disorder Son    ADD / ADHD Neg Hx    Anxiety disorder Neg Hx    Dementia Neg Hx    Depression Neg Hx    OCD Neg Hx    Seizures Neg Hx    Paranoid behavior Neg Hx    Colon cancer Neg Hx    Rectal cancer Neg Hx     Social History:  reports that she has never smoked. She has never been exposed to tobacco smoke. She has never used smokeless tobacco. She reports that she does not drink alcohol and does not use drugs.  Allergies:  Allergies  Allergen Reactions   Neurontin [Gabapentin] Nausea And Vomiting and Other (See Comments)    Sleep walking/hallucinations    Penicillins Hives, Shortness Of Breath and Other (See Comments)    Has patient had a PCN reaction causing immediate rash, facial/tongue/throat swelling, SOB or lightheadedness with hypotension: Yes Has patient had a PCN reaction causing severe rash involving mucus membranes or skin necrosis: No Has patient had a PCN reaction that required hospitalization: No Has patient had a PCN reaction occurring within the last 10 years: Yes If all of the above answers are "NO", then may proceed with Cephalosporin use.   Pregabalin Anaphylaxis, Shortness Of Breath, Diarrhea and Swelling    Lyrica   Tramadol  Other (See Comments)    Makes patient "delusional"   Latex Rash    Medications: I have reviewed the patient's current medications.  The PMH, PSH, Medications, Allergies, and SH were reviewed and updated.  ROS: Constitutional: Negative for fever, weight loss and weight gain. Cardiovascular: Negative for chest pain and dyspnea on exertion. Respiratory: Is not experiencing shortness of breath at rest. Gastrointestinal: Negative for nausea and vomiting. Neurological: Negative for headaches. Psychiatric: The patient is not nervous/anxious  There were no vitals taken for this visit. There is no height or weight on file to calculate  BMI.  PHYSICAL EXAM:  Exam: General: Well-developed, well-nourished Communication and Voice: Clear pitch and clarity Respiratory Respiratory effort: Equal inspiration and expiration without stridor Cardiovascular Peripheral Vascular: Warm extremities with equal color/perfusion Eyes: No nystagmus with equal extraocular motion bilaterally Neuro/Psych/Balance: Patient oriented to person, place, and time; Appropriate mood and affect; Gait is intact with no imbalance; Cranial nerves I-XII are intact Head and Face Inspection: Normocephalic and atraumatic without mass or lesion Palpation: Facial  skeleton intact without bony stepoffs Salivary Glands: No mass or tenderness Facial Strength: Facial motility symmetric and full bilaterally ENT Pinna: External ear intact and fully developed External canal: Canal is patent with intact skin Tympanic Membrane: Clear and mobile External Nose: No scar or anatomic deformity Internal Nose: Septum is ***. No polyp, or purulence. Mucosal edema and erythema present.  Bilateral inferior turbinate hypertrophy.  Lips, Teeth, and gums: Mucosa and teeth intact and viable TMJ: No pain to palpation with full mobility Oral cavity/oropharynx: No erythema or exudate, no lesions present Nasopharynx: No mass or lesion with intact mucosa Hypopharynx: Intact mucosa without pooling of secretions Larynx Glottic: Full true vocal cord mobility without lesion or mass Supraglottic: Normal appearing epiglottis and AE folds Interarytenoid Space: Moderate pachydermia&edema Subglottic Space: Patent without lesion or edema Neck Neck and Trachea: Midline trachea without mass or lesion Thyroid : No mass or nodularity Lymphatics: No lymphadenopathy  Procedure: {lsofficescopes (Optional):32311}     Studies Reviewed: CT sinuses 02/01/24 IMPRESSION: Paranasal sinus disease as above. Air-fluid levels and frothy secretions in the bilateral maxillary sinuses, recommend  clinical correlation for acute sinusitis.   Mucosal thickening in the left middle meatus and at the left maxillary sinus ostium contributing to ostiomeatal occlusion.   Mucosal thickening in the ethmoid sinuses, left greater than right, with involvement of the sphenoethmoidal recesses bilaterally.   Rightward nasal septal deviation.   Periapical lucencies involving the right maxillary cuspid and first premolar are new since 2018.    Assessment/Plan: No diagnosis found.  Assessment and Plan Assessment & Plan       Thank you for allowing me to participate in the care of this patient. Please do not hesitate to contact me with any questions or concerns.   Artice Last, MD Otolaryngology [facility] Phone: [phone] Fax: [phone]    04/05/2024, 8:32 AM

## 2024-04-05 NOTE — Progress Notes (Unsigned)
 Jane English     MRN: [id number]      DOB: [birth year]  Chief Complaint  Patient presents with   Medical Management of Chronic Issues    13 week follow up     HPI Jane English is here for follow up and re-evaluation of chronic medical conditions, medication management and review of any available recent lab and radiology data.  Preventive health is updated, specifically  Cancer screening and Immunization.   C/o uncontrolled depression and anxiety, wants to se specific therapist, no suicidal or homicidal C/o intemrittent left chest pain , no specific aggaravating or relieving factors for last 4 weeks C/o ongoing sinus symptoms has upsmoing appt with ENT   ROS Denies recent fever or chills. Denies chest congestion, productive cough or wheezing. Denies s, palpitations and leg swelling Denies abdominal pain, nausea, vomiting,diarrhea or constipation.   Denies dysuria, frequency, hesitancy or incontinence. Chronic  joint pain, swelling and limitation in mobility. Denies headaches, seizures,  Denies skin break down or rash. C/o perisitent toenail discoloration   PE  BP (!) 160/100   Pulse 86   Resp 18   Ht 5\' 6"  (1.676 m)   Wt 177 lb (80.3 kg)   SpO2 97%   BMI 28.57 kg/m   Patient alert and oriented and in no cardiopulmonary distress.  HEENT: No facial asymmetry, EOMI,     Neck supple .  Chest: Clear to auscultation bilaterally.  CVS: S1, S2 no murmurs, no S3.Regular rate.  ABD: Soft non tender.   Ext: No edema  MS: Adequate though reduced  ROM spine, shoulders, hips and knees.  Skin: Intact, no ulcerations or rash noted.  Psych: Good eye contact, normal affect. Memory intact both  anxious and  mildly  depressed appearing.  CNS: CN 2-12 intact, power,  normal throughout.no focal deficits noted.   Assessment & Plan  Depression, major, single episode, severe (HCC) Uncontrolled, increase med dose and refer to new therapist of pt's choice if she will  be  accepted  Anxiety Uncontrolled requests  specific therapist, score of 12 03/2024 Needs to commit to taking buspar  as prescribed more regularly on a schedule  Hyperlipidemia LDL goal <100 Hyperlipidemia:Low fat diet discussed and encouraged.   Lipid Panel  Lab Results  Component Value Date   CHOL 204 (H) 07/01/2023   HDL 66 07/01/2023   LDLCALC 94 07/01/2023   TRIG 268 (H) 07/01/2023   CHOLHDL 3.1 07/01/2023     Updated lab needed at/ before next visit.   Essential hypertension DASH diet and commitment to daily physical activity for a minimum of 30 minutes discussed and encouraged, as a part of hypertension management. The importance of attaining a healthy weight is also discussed.     03/25/2024   10:11 AM 03/25/2024    9:34 AM 03/22/2024    8:59 AM 03/17/2024    3:37 PM 02/25/2024    8:53 AM 01/30/2024   11:10 AM 12/31/2023    3:18 PM  BP/Weight  Systolic BP 160 156 149 161 119 144 130  Diastolic BP 100 92 85 101 74 75 74  Wt. (Lbs)  177 176   177.4 166  BMI  28.57 kg/m2 28.41 kg/m2   28.63 kg/m2 26.79 kg/m2     Add benicar /hctz  Discolored nails Refer dermatology for eval and management  Type 2 diabetes mellitus with other specified complication (HCC) Diabetes associated with hypertension, hyperlipidemia, and depression  [name] is reminded of the importance of commitment to  daily physical activity for 30 minutes or more, as able and the need to limit carbohydrate intake to 30 to 60 grams per meal to help with blood sugar control.  Continue current med dose  The need to take medication as prescribed, test blood sugar as directed, and to call between visits if there is a concern that blood sugar is uncontrolled is also discussed.   [name] is reminded of the importance of daily foot exam, annual eye examination, and good blood sugar, blood pressure and cholesterol control.     Latest Ref Rng & Units 03/25/2024   10:44 AM 12/22/2023    3:06 PM 12/17/2023    10:36 AM 07/01/2023    9:28 AM 03/12/2023   10:57 AM  Diabetic Labs  HbA1c 4.8 - 5.6 %   5.9  6.0  5.9   Micro/Creat Ratio 0 - 29 mg/g creat   5     Chol 100 - 199 mg/dL 045    409  811   HDL >91 mg/dL 63    66  62   Calc LDL 0 - 99 mg/dL 64    94  478   Triglycerides 0 - 149 mg/dL 69    295  71   Creatinine 0.57 - 1.00 mg/dL 6.21  3.08  6.57  8.46  1.07       03/25/2024   10:11 AM 03/25/2024    9:34 AM 03/22/2024    8:59 AM 03/17/2024    3:37 PM 02/25/2024    8:53 AM 01/30/2024   11:10 AM 12/31/2023    3:18 PM  BP/Weight  Systolic BP 160 156 149 161 119 144 130  Diastolic BP 100 92 85 101 74 75 74  Wt. (Lbs)  177 176   177.4 166  BMI  28.57 kg/m2 28.41 kg/m2   28.63 kg/m2 26.79 kg/m2      Latest Ref Rng & Units 12/17/2023    9:40 AM 07/10/2023   12:00 AM  Foot/eye exam completion dates  Eye Exam No Retinopathy  No Retinopathy      Foot Form Completion  Done      This result is from an external source.        Chest pain Intermittent chest pain, left sided x 4 weeks, thinks stress related, but at increased risk for CAD based on co morbidities Office EKG: NSR , no ischemia , no LVH , no change from prior

## 2024-04-05 NOTE — Assessment & Plan Note (Signed)
Refer dermatology for eval and management

## 2024-04-09 ENCOUNTER — Ambulatory Visit (HOSPITAL_COMMUNITY)
Admission: RE | Admit: 2024-04-09 | Discharge: 2024-04-09 | Disposition: A | Discharge status: Home / Self Care | Payer: 59 | Source: Ambulatory Visit | Attending: Family Medicine | Admitting: Family Medicine

## 2024-04-09 ENCOUNTER — Encounter (HOSPITAL_COMMUNITY): Payer: Self-pay

## 2024-04-09 DIAGNOSIS — Z1231 Encounter for screening mammogram for malignant neoplasm of breast: Secondary | ICD-10-CM | POA: Diagnosis not present

## 2024-04-12 ENCOUNTER — Ambulatory Visit (INDEPENDENT_AMBULATORY_CARE_PROVIDER_SITE_OTHER): Admitting: Psychiatry

## 2024-04-12 DIAGNOSIS — F331 Major depressive disorder, recurrent, moderate: Secondary | ICD-10-CM | POA: Diagnosis not present
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
## 2024-04-16 ENCOUNTER — Other Ambulatory Visit: Payer: Self-pay | Admitting: Family Medicine

## 2024-04-26 ENCOUNTER — Encounter: Attending: Physical Medicine and Rehabilitation | Admitting: Physical Medicine and Rehabilitation

## 2024-04-26 VITALS — BP 125/80 | HR 87 | Ht 66.0 in | Wt 175.0 lb

## 2024-04-26 DIAGNOSIS — G8929 Other chronic pain: Secondary | ICD-10-CM | POA: Diagnosis not present

## 2024-04-26 DIAGNOSIS — G894 Chronic pain syndrome: Secondary | ICD-10-CM | POA: Insufficient documentation

## 2024-04-26 DIAGNOSIS — Z87828 Personal history of other (healed) physical injury and trauma: Secondary | ICD-10-CM | POA: Insufficient documentation

## 2024-04-26 DIAGNOSIS — M961 Postlaminectomy syndrome, not elsewhere classified: Secondary | ICD-10-CM | POA: Diagnosis not present

## 2024-04-26 DIAGNOSIS — M25562 Pain in left knee: Secondary | ICD-10-CM | POA: Diagnosis not present

## 2024-04-26 DIAGNOSIS — M25561 Pain in right knee: Secondary | ICD-10-CM | POA: Insufficient documentation

## 2024-04-26 MED ORDER — OXYCODONE HCL 5 MG PO TABS: 10.0000 mg | ORAL_TABLET | Freq: Two times a day (BID) | ORAL | 0 refills | Status: DC

## 2024-04-26 NOTE — Patient Instructions (Signed)
 Chronic Pain Syndrome secondary to___ -Discussed current symptoms of pain and history of pain.  -Discussed benefits of exercise in reducing pain. -Discussed following foods that may reduce pain: 1) Ginger (especially studied for arthritis)- reduce leukotriene production to decrease inflammation 2) Blueberries- high in phytonutrients that decrease inflammation 3) Salmon- marine omega-3s reduce joint swelling and pain 4) Pumpkin seeds- reduce inflammation 5) dark chocolate- reduces inflammation 6) turmeric- reduces inflammation 7) tart cherries - reduce pain and stiffness 8) extra virgin olive oil - its compound olecanthal helps to block prostaglandins  9) chili peppers- can be eaten or applied topically via capsaicin 10) mint- helpful for headache, muscle aches, joint pain, and itching 11) garlic- reduces inflammation 12) Green tea- reduces inflammation and oxidative stress, helps with weight loss, may reduce the risk of cancer, recommend Double Green Matcha [place] of Tea daily  Link to further information on diet for chronic pain: [email or web address]
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
## 2024-05-09 ENCOUNTER — Other Ambulatory Visit: Payer: Self-pay | Admitting: Family Medicine

## 2024-05-10 ENCOUNTER — Ambulatory Visit (INDEPENDENT_AMBULATORY_CARE_PROVIDER_SITE_OTHER): Payer: Self-pay | Admitting: Allergy & Immunology

## 2024-05-10 ENCOUNTER — Encounter: Payer: Self-pay | Admitting: Allergy & Immunology

## 2024-05-10 ENCOUNTER — Other Ambulatory Visit: Payer: Self-pay | Admitting: Family Medicine

## 2024-05-10 ENCOUNTER — Other Ambulatory Visit: Payer: Self-pay

## 2024-05-10 ENCOUNTER — Other Ambulatory Visit: Payer: Self-pay | Admitting: Internal Medicine

## 2024-05-10 VITALS — BP 132/72 | HR 100 | Temp 97.7°F | Resp 18 | Ht 65.35 in | Wt 171.6 lb

## 2024-05-10 DIAGNOSIS — B999 Unspecified infectious disease: Secondary | ICD-10-CM | POA: Diagnosis not present

## 2024-05-10 DIAGNOSIS — J4540 Moderate persistent asthma, uncomplicated: Secondary | ICD-10-CM | POA: Diagnosis not present

## 2024-05-10 DIAGNOSIS — J310 Chronic rhinitis: Secondary | ICD-10-CM

## 2024-05-10 NOTE — Addendum Note (Signed)
 Addended by: MENDEZ-MUNGARAY, Jailyn Leeson M on: 05/10/2024 04:52 PM   Modules accepted: Orders

## 2024-05-10 NOTE — Progress Notes (Signed)
 NEW PATIENT  Date of Service/Encounter:  05/10/24  Consult requested by: Antonetta Rollene BRAVO, MD   Assessment:   Moderate persistent asthma, uncomplicated  Chronic rhinitis - planning for skin testing at the next visit  Recurrent infections - getting immune screen today  Alopecia - sees Dr. Alm  Plan/Recommendations:   1. Moderate persistent asthma, uncomplicated - Lung testing did not look great, but you are under the care of Dr. Jude who is excellent.  - We are not going to make any changes, but you could consider the addition of Fasenra for management of your breathing even more. - Information on Fasenra provided today.  2. Chronic rhinitis - Because of insurance stipulations, we cannot do skin testing on the same day as your first visit. - We are all working to fight this, but for now we need to do two separate visits.  - We will know more after we do testing at the next visit.  - The skin testing visit can be squeezed in at your convenience.  - Then we can make a more full plan to address all of your symptoms. - Be sure to stop your antihistamines for 3 days before this appointment.   3. Recurrent infections - We will obtain some screening labs to evaluate your immune system.  - Labs to evaluate the quantitative [facility]) aspects of your immune system: IgG/IgA/IgM, CBC with differential - Labs to evaluate the qualitative (HOW WELL THEY WORK) aspects of your immune system: CH50, Pneumococcal titers, Tetanus titers, Diphtheria titers - We may consider immunizations with Pneumovax and Tdap to challenge your immune system, and then obtain repeat titers in 4-6 weeks.   4. Return in about 1 week (around 05/17/2024) for ALLERGY  TESTING. You can have the follow up appointment with Dr. Iva or a Nurse Practicioner (our Nurse Practitioners are excellent and always have Physician oversight!).    This note in its entirety was forwarded to the Provider who requested this  consultation.  Subjective:   Jane English is a 53 y.o. female presenting today for evaluation of  Chief Complaint  Patient presents with   Asthma    Was referred due to asthma and bronchitis    Jane English has a history of the following: Patient Active Problem List   Diagnosis Date Noted   Chest pain 04/05/2024   Discolored nails 03/25/2024   Bronchitis 01/06/2024   Severe persistent asthma with acute exacerbation 01/06/2024   Encounter for examination following treatment at hospital 01/06/2024   Atypical squamous cell changes of undetermined significance (ASCUS) on cervical cytology with negative high risk human papilloma virus (HPV) test result 12/22/2023   Depression, major, single episode, moderate (HCC) 12/22/2023   GAD (generalized anxiety disorder) 12/22/2023   Sinusitis, acute 12/17/2023   Bronchitis, acute 10/16/2023   Acute sinusitis 10/16/2023   Dry eyes, bilateral 10/16/2023   Encounter for annual general medical examination with abnormal findings in adult 07/01/2023   Neck pain, bilateral 07/01/2023   Hyperlipidemia LDL goal <100 07/01/2023   Folliculitis 04/07/2023   Dermatitis 02/25/2023   Pruritus 02/25/2023   Primary osteoarthritis of left knee 08/01/2022   Acute lateral meniscus tear of left knee 07/05/2022   Type 2 diabetes mellitus with other specified complication (HCC) 03/21/2022   Anxiety 12/16/2020   Left foot pain 05/01/2020   Great toe pain, left 05/01/2020   Back spasm 04/10/2020   Urinary urgency 08/11/2019   Overweight (BMI 25.0-29.9) 04/26/2019   Anxiety and depression  04/26/2019   Arthralgia of left shoulder region 11/05/2018   Depression, major, single episode, severe (HCC) 10/03/2018   Vitamin D  deficiency 12/14/2017   Bilateral ankle pain 04/16/2017   Amenorrhea 10/15/2016   Paresthesia 05/06/2016   Back pain with radiation 04/04/2016   Obesity (BMI 30.0-34.9) 03/25/2016   Neuropathic pain of finger of right hand 12/31/2015   Pap  smear of cervix shows high risk HPV present 08/08/2015   Chronic sinusitis 05/28/2015   Primary osteoarthritis of right knee 03/17/2015   Cyst of left ovary 02/06/2015   Anovulation 02/06/2015   Secondary amenorrhea 02/06/2015   Intractable chronic migraine without aura and without status migrainosus 12/21/2014   Migraine 09/21/2014   Heart murmur 05/13/2014   Abdominal adhesions 02/15/2014   Anemia 10/11/2013   H. pylori duodenitis 07/07/2013   PTSD (post-traumatic stress disorder) 03/26/2013   Insomnia 03/18/2013   GERD (gastroesophageal reflux disease) 10/29/2012   Chronic pain syndrome 10/06/2012   Postlaminectomy syndrome 10/06/2012   Onychomycosis 09/02/2012   Cervical neck pain with evidence of disc disease 04/13/2012   Goiter 06/07/2011   Allergic rhinitis 04/17/2010   Cough variant asthma 03/04/2010   Essential hypertension 11/27/2007    History obtained from: chart review and patient.  Discussed the use of AI scribe software for clinical note transcription with the patient and/or guardian, who gave verbal consent to proceed.  Jane English was referred by Antonetta Rollene BRAVO, MD.     Jane English is a 53 y.o. female presenting for an evaluation of allergies and asthma.   Asthma/Respiratory Symptom History: She has a long-standing history of asthma and bronchitis, beginning in her twenties. Her current management includes daily use of Breo and as-needed use of Qvar . She uses her emergency inhaler approximately once a week or every other week. Her mother also had asthma and bronchitis, indicating a family history of these conditions.  In February and March, she experienced a severe sinus infection that required extensive testing, including blood work, chest x-rays, and head x-rays. She was treated with prednisone , antibiotics, and breathing treatments, but these did not resolve her symptoms. She mentioned RSV as a diagnosis.   She is followed by Dr. Jude.  Her last appointment  was in May 2025.  At that time, she seemed to be doing well with the Breo and the Qvar .  Trigger avoidance was discussed, which is one of the reason she came into the clinic today.  She did have a CT angiography of her chest in February 2025 that was negative for clot.  Lungs were clear without pleural effusion or pneumothorax.  Allergic Rhinitis Symptom History: She experiences significant environmental allergies, being allergic to multiple allergens including trees and grass. No known food allergies and no eczema. Her current treatment with Flonase  and an antihistamine does not fully control her symptoms, which vary between a runny and dry nose.  She has an appointment with the ENT at the end of the month.  She has not had any sinus surgery.  Her last sinus CT in March 2025 showed right septal deviation as well as paranasal sinus disease and bilateral maxillary sinus disease.  She also had mucosal thickening in the ethmoid sinuses.  Skin Symptom History: She has a history of alopecia, which she attributes to stress. Treatment with a steroid cream has helped her hair grow back.  Infection Symptom History: She does tend to get sinusitis quite often.  She has never had an immune workup, but is open to it.  She is on disability due to bipolar disorder and back issues. 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 She has four children and four grandchildren. No skin problems aside from tattoos and no issues with food causing throat swelling. She is up to date on her routine vaccines.   Otherwise, there is no history of other atopic diseases, including as needed female comes in once her next appointment. There is no significant infectious history. Vaccinations are up to date.    Past Medical History: Patient Active Problem List   Diagnosis Date Noted   Chest pain 04/05/2024   Discolored nails 03/25/2024   Bronchitis 01/06/2024   Severe persistent asthma with acute  exacerbation 01/06/2024   Encounter for examination following treatment at hospital 01/06/2024   Atypical squamous cell changes of undetermined significance (ASCUS) on cervical cytology with negative high risk human papilloma virus (HPV) test result 12/22/2023   Depression, major, single episode, moderate (HCC) 12/22/2023   GAD (generalized anxiety disorder) 12/22/2023   Sinusitis, acute 12/17/2023   Bronchitis, acute 10/16/2023   Acute sinusitis 10/16/2023   Dry eyes, bilateral 10/16/2023   Encounter for annual general medical examination with abnormal findings in adult 07/01/2023   Neck pain, bilateral 07/01/2023   Hyperlipidemia LDL goal <100 07/01/2023   Folliculitis 04/07/2023   Dermatitis 02/25/2023   Pruritus 02/25/2023   Primary osteoarthritis of left knee 08/01/2022   Acute lateral meniscus tear of left knee 07/05/2022   Type 2 diabetes mellitus with other specified complication (HCC) 03/21/2022   Anxiety 12/16/2020   Left foot pain 05/01/2020   Great toe pain, left 05/01/2020   Back spasm 04/10/2020   Urinary urgency 08/11/2019   Overweight (BMI 25.0-29.9) 04/26/2019   Anxiety and depression 04/26/2019   Arthralgia of left shoulder region 11/05/2018   Depression, major, single episode, severe (HCC) 10/03/2018   Vitamin D  deficiency 12/14/2017   Bilateral ankle pain 04/16/2017   Amenorrhea 10/15/2016   Paresthesia 05/06/2016   Back pain with radiation 04/04/2016   Obesity (BMI 30.0-34.9) 03/25/2016   Neuropathic pain of finger of right hand 12/31/2015   Pap smear of cervix shows high risk HPV present 08/08/2015   Chronic sinusitis 05/28/2015   Primary osteoarthritis of right knee 03/17/2015   Cyst of left ovary 02/06/2015   Anovulation 02/06/2015   Secondary amenorrhea 02/06/2015   Intractable chronic migraine without aura and without status migrainosus 12/21/2014   Migraine 09/21/2014   Heart murmur 05/13/2014   Abdominal adhesions 02/15/2014   Anemia 10/11/2013    H. pylori duodenitis 07/07/2013   PTSD (post-traumatic stress disorder) 03/26/2013   Insomnia 03/18/2013   GERD (gastroesophageal reflux disease) 10/29/2012   Chronic pain syndrome 10/06/2012   Postlaminectomy syndrome 10/06/2012   Onychomycosis 09/02/2012   Cervical neck pain with evidence of disc disease 04/13/2012   Goiter 06/07/2011   Allergic rhinitis 04/17/2010   Cough variant asthma 03/04/2010   Essential hypertension 11/27/2007    Medication List:  Allergies as of 05/10/2024       Reactions   Neurontin [gabapentin] Nausea And Vomiting, Other (See Comments)   Sleep walking/hallucinations    Penicillins Hives, Shortness Of Breath, Other (See Comments)   Has patient had a PCN reaction causing immediate rash, facial/tongue/throat swelling, SOB or lightheadedness with hypotension: Yes Has patient had a PCN reaction causing severe rash involving mucus membranes or skin necrosis: No Has patient had a PCN reaction that required hospitalization: No Has patient had a PCN reaction  occurring within the last 10 years: Yes If all of the above answers are NO, then may proceed with Cephalosporin use.   Pregabalin Anaphylaxis, Shortness Of Breath, Diarrhea, Swelling   Lyrica   Tramadol  Other (See Comments)   Makes patient delusional   Latex Rash        Medication List        Accurate as of May 10, 2024 10:09 AM. If you have any questions, ask your nurse or doctor.          Accu-Chek Guide test strip Generic drug: glucose blood USE AS INSTRUCTED ONCE DAILY DX E11.9   albuterol  (2.5 MG/3ML) 0.083% nebulizer solution Commonly known as: PROVENTIL  Take 3 mLs (2.5 mg total) by nebulization every 6 (six) hours as needed for wheezing or shortness of breath.   albuterol  108 (90 Base) MCG/ACT inhaler Commonly known as: VENTOLIN  HFA TAKE 2 PUFFS BY MOUTH EVERY 6 HOURS AS NEEDED FOR WHEEZE OR SHORTNESS OF BREATH   amitriptyline  10 MG tablet Commonly known as: ELAVIL  TAKE 1  TABLET BY MOUTH EVERYDAY AT BEDTIME   benzoyl peroxide  5 % external liquid Apply topically 2 (two) times daily.   blood glucose meter kit and supplies Dispense based on patient and insurance preference. Once daily testing DX E11.9   Breo Ellipta  100-25 MCG/ACT Aepb Generic drug: fluticasone  furoate-vilanterol INHALE 1 PUFF INTO THE LUNGS TWICE A DAY   busPIRone  5 MG tablet Commonly known as: BUSPAR  TAKE 1 TABLET BY MOUTH THREE TIMES A DAY   cloNIDine  0.1 MG tablet Commonly known as: CATAPRES  TAKE 1 TABLET BY MOUTH EVERYDAY AT BEDTIME   cromolyn  4 % ophthalmic solution Commonly known as: OPTICROM  Place 1 drop into both eyes 4 (four) times daily.   diclofenac  Sodium 1 % Gel Commonly known as: VOLTAREN  APPLY 2 GRAMS TO AFFECTED AREA 4 TIMES A DAY   EPINEPHrine  0.3 mg/0.3 mL Soaj injection Commonly known as: EPI-PEN as needed.   fluticasone  50 MCG/ACT nasal spray Commonly known as: FLONASE  Place 1-2 sprays into both nostrils daily.   ipratropium 0.03 % nasal spray Commonly known as: ATROVENT  Place 2 sprays into both nostrils every 12 (twelve) hours.   lidocaine  2 % jelly Commonly known as: XYLOCAINE    lidocaine  5 % Commonly known as: Lidoderm  Place 1 patch onto the skin daily. Remove & Discard patch within 12 hours or as directed by MD   linaclotide  290 MCG Caps capsule Commonly known as: Linzess  TAKE 1 CAPSULE (290 MCG TOTAL) BY MOUTH DAILY BEFORE BREAKFAST.   loratadine  10 MG tablet Commonly known as: CLARITIN  TAKE 1 TABLET BY MOUTH EVERY DAY   lubiprostone  24 MCG capsule Commonly known as: AMITIZA  TAKE 1 CAPSULE BY MOUTH TWICE A DAY WITH MEALS   magnesium  oxide 400 MG tablet Commonly known as: MAG-OX Take 1 tablet (400 mg total) by mouth 2 (two) times daily.   meloxicam  7.5 MG tablet Commonly known as: MOBIC  TAKE 1 TABLET BY MOUTH 2 TIMES DAILY AS NEEDED FOR PAIN.   montelukast  10 MG tablet Commonly known as: SINGULAIR  TAKE 1 TABLET BY MOUTH  EVERYDAY AT BEDTIME   Na Sulfate-K Sulfate-Mg Sulfate concentrate 17.5-3.13-1.6 GM/177ML Soln Commonly known as: SUPREP Take as directed   Narcan  4 MG/0.1ML Liqd nasal spray kit Generic drug: naloxone  Place 4 mg into the nose as directed.   NONFORMULARY OR COMPOUNDED ITEM Apply 1-2 pumps, 3-4 times daily as needed.   olmesartan -hydrochlorothiazide  20-12.5 MG tablet Commonly known as: BENICAR  HCT TAKE 1 TABLET BY MOUTH  EVERY DAY   ondansetron  4 MG tablet Commonly known as: Zofran  Take 1 tablet (4 mg total) by mouth every 8 (eight) hours as needed for nausea or vomiting.   oxybutynin  10 MG 24 hr tablet Commonly known as: DITROPAN -XL TAKE 1 TABLET BY MOUTH EVERYDAY AT BEDTIME   oxyCODONE  5 MG immediate release tablet Commonly known as: Roxicodone  Take 2 tablets (10 mg total) by mouth 2 (two) times daily. Do Not Fill Before 04/08/2024   pantoprazole  40 MG tablet Commonly known as: PROTONIX  Take 1 tablet (40 mg total) by mouth daily.   PARoxetine  20 MG tablet Commonly known as: PAXIL  TAKE 1 TABLET BY MOUTH EVERY DAY   potassium chloride  20 MEQ/15ML (10%) Soln Take 15 mLs (20 mEq total) by mouth 2 (two) times daily.   Qvar  RediHaler 40 MCG/ACT inhaler Generic drug: beclomethasone Inhale 2 puffs into the lungs 2 (two) times daily.   rosuvastatin  5 MG tablet Commonly known as: CRESTOR  TAKE 1 TABLET (5 MG TOTAL) BY MOUTH DAILY.   Safety Seal Miscellaneous Misc Apply 1 Application topically in the morning. MEDICATION NAME: AA gel (Minoxidil 10%, Clobetasol 0.05%, Finasteride 0.05%)   solifenacin  5 MG tablet Commonly known as: VESICARE  TAKE 1 TABLET (5 MG TOTAL) BY MOUTH DAILY.   terbinafine  250 MG tablet Commonly known as: LAMISIL  Take 1 tablet (250 mg total) by mouth daily.   tirzepatide  5 MG/0.5ML Pen Commonly known as: MOUNJARO  Inject 5 mg into the skin once a week.   topiramate  25 MG tablet Commonly known as: TOPAMAX  TAKE 1 TABLET BY MOUTH TWICE A DAY    trolamine salicylate 10 % cream Commonly known as: ASPERCREME Apply 1 application topically as needed for muscle pain.        Birth History: non-contributory  Developmental History: non-contributory  Past Surgical History: Past Surgical History:  Procedure Laterality Date   ADENOIDECTOMY     ANTERIOR CERVICAL DECOMP/DISCECTOMY FUSION  07/07/2012   Procedure: ANTERIOR CERVICAL DECOMPRESSION/DISCECTOMY FUSION 2 LEVELS;  Surgeon: Catalina CHRISTELLA Stains, MD;  Location: [facility];  Service: Neurosurgery;  Laterality: N/A;  Cervical four-five, five - six  Anterior cervical decompression/diskectomy/fusion/plate   APPENDECTOMY  11/04/1984   BOWEL RESECTION N/A 07/29/2013   Procedure: serosal repair;  Surgeon: Elspeth KYM Schultze, MD;  Location: [facility];  Service: General;  Laterality: N/A;   CARPAL TUNNEL RELEASE Bilateral    COLON SURGERY N/A    [employer]rees[employer] 07/29/2020   LAPAROSCOPY N/A 07/29/2013   Procedure: diagnostic laporoscopy;  Surgeon: Elspeth KYM Schultze, MD;  Location: [facility];  Service: General;  Laterality: N/A;   LAPAROSCOPY N/A 08/16/2013   Procedure: LAPAROSCOPY DIAGNOSTIC/LYSIS OF ADHESIONS;  Surgeon: Elspeth KYM Schultze, MD;  Location: [facility];  Service: General;  Laterality: N/A;   LAPAROTOMY N/A 08/16/2013   Procedure: EXPLORATORY LAPAROTOMY/SMALL BOWEL RESECTION (JEJUNUM);  Surgeon: Elspeth KYM Schultze, MD;  Location: [facility];  Service: General;  Laterality: N/A;   LUMBAR SPINE SURGERY  11/04/2008   x 3   LYSIS OF ADHESION  11/04/2001   Dr. Keven Sharps   LYSIS OF ADHESION N/A 07/29/2013   Procedure: LYSIS OF ADHESION;  Surgeon: Elspeth KYM Schultze, MD;  Location: [facility];  Service: General;  Laterality: N/A;   OOPHORECTOMY     PARTIAL HYSTERECTOMY  1990s?   [place], [place]   SPINAL CORD STIMULATOR IMPLANT     spine stimulator removal  12/13/2022   SPINE SURGERY N/A    [employer]reesia 07/29/2020   TRIGGER FINGER RELEASE  11/05/2007  right pinkie finger   TUBAL LIGATION  11/04/1992     Family  History: Family History  Problem Relation Age of Onset   Physical abuse Mother    Alcohol abuse Mother    Cirrhosis Mother    Lung cancer Father    Stomach cancer Father    Esophageal cancer Father    Alcohol abuse Father    Mental illness Father    Diabetes Sister    Hypertension Sister    Bipolar disorder Sister    Schizophrenia Sister    Diabetes Sister    Drug abuse Sister    HIV Sister    Pneumonia Sister        died as a baby   Alcohol abuse Brother    Hypertension Brother    Kidney disease Brother    Diabetes Brother    Drug abuse Brother    Mental illness Brother    Alcohol abuse Brother    Alcohol abuse Brother    Hypertension Brother    Diabetes Brother    Alcohol abuse Brother    Mental illness Brother        in [place]   Alcohol abuse Brother    Alcohol abuse Brother    Bipolar disorder Brother    Hypertension Brother    Bipolar disorder Brother    Drug abuse Brother    Alcohol abuse Brother    Bipolar disorder Brother    Bipolar disorder Daughter    Bipolar disorder Son    Bipolar disorder Son    ADD / ADHD Neg Hx    Anxiety disorder Neg Hx    Dementia Neg Hx    Depression Neg Hx    OCD Neg Hx    Seizures Neg Hx    Paranoid behavior Neg Hx    Colon cancer Neg Hx    Rectal cancer Neg Hx      Social History: Jane English lives at home with her family. She lives in an apartment with carpeting throughout the home.  They have electric heating and central cooling.  There are dogs in the home.  There are cats outside of the home.  There are no dust or covers on the bedding.  There is no tobacco exposure.  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  She is on disability.  There is no fume, chemical, or dust exposure.  There is a HEPA filter in the home.  [place] do not [place] an interstate or industrial area.   Review of systems otherwise negative other than that mentioned in the HPI.     Objective:   Blood pressure 132/72, pulse 100, temperature  97.7 F (36.5 C), temperature source Temporal, resp. rate 18, height 5' 5.35 (1.66 m), weight 171 lb 9.6 oz (77.8 kg), SpO2 97%. Body mass index is 28.25 kg/m.     Physical Exam Vitals reviewed.  Constitutional:      Appearance: She is well-developed.     Comments: Somewhat flat affect, but interactive.   HENT:     Head: Normocephalic and atraumatic.     Right Ear: Tympanic membrane, ear canal and external ear normal. No drainage, swelling or tenderness. Tympanic membrane is not injected, scarred, erythematous, retracted or bulging.     Left Ear: Tympanic membrane, ear canal and external ear normal. No drainage, swelling or tenderness. Tympanic membrane is not injected, scarred, erythematous, retracted or bulging.     Nose: No nasal deformity, septal deviation, mucosal edema  or rhinorrhea.     Right Turbinates: Enlarged, swollen and pale.     Left Turbinates: Enlarged, swollen and pale.     Right Sinus: No maxillary sinus tenderness or frontal sinus tenderness.     Left Sinus: No maxillary sinus tenderness or frontal sinus tenderness.     Mouth/Throat:     Lips: Pink.     Mouth: Mucous membranes are moist. Mucous membranes are not pale and not dry.     Pharynx: Uvula midline.     Comments: Cobblestoning in the posterior oropharynx.  Eyes:     General:        Right eye: No discharge.        Left eye: No discharge.     Conjunctiva/sclera: Conjunctivae normal.     Right eye: Right conjunctiva is not injected. No chemosis.    Left eye: Left conjunctiva is not injected. No chemosis.    Pupils: Pupils are equal, round, and reactive to light.  Cardiovascular:     Rate and Rhythm: Normal rate and regular rhythm.     Heart sounds: Normal heart sounds.  Pulmonary:     Effort: Pulmonary effort is normal. No tachypnea, accessory muscle usage or respiratory distress.     Breath sounds: Normal breath sounds. No wheezing, rhonchi or rales.  Chest:     Chest wall: No tenderness.   Abdominal:     Tenderness: There is no abdominal tenderness. There is no guarding or rebound.  Lymphadenopathy:     Head:     Right side of head: No submandibular, tonsillar or occipital adenopathy.     Left side of head: No submandibular, tonsillar or occipital adenopathy.     Cervical: No cervical adenopathy.  Skin:    Coloration: Skin is not pale.     Findings: No abrasion, erythema, petechiae or rash. Rash is not papular, urticarial or vesicular.  Neurological:     Mental Status: She is alert.  Psychiatric:        Behavior: Behavior is cooperative.      Diagnostic studies:    Spirometry: results abnormal (FEV1: 1.63/67%, FVC: 3.36/111%, FEV1/FVC: 49%).    Spirometry consistent with mild obstructive disease. This was poor effort overall.   Allergy  Studies: labs sent instead, but we are scheduling for environmental allergy  testing     Marty Shaggy, MD [facility] 

## 2024-05-10 NOTE — Patient Instructions (Addendum)
 1. Moderate persistent asthma, uncomplicated - Lung testing did not look great, but you are under the care of Dr. Jude who is excellent.  - We are not going to make any changes, but you could consider the addition of Fasenra for management of your breathing even more. - Information on Fasenra provided today.  2. Chronic rhinitis - Because of insurance stipulations, we cannot do skin testing on the same day as your first visit. - We are all working to fight this, but for now we need to do two separate visits.  - We will know more after we do testing at the next visit.  - The skin testing visit can be squeezed in at your convenience.  - Then we can make a more full plan to address all of your symptoms. - Be sure to stop your antihistamines for 3 days before this appointment.   3. Recurrent infections - We will obtain some screening labs to evaluate your immune system.  - Labs to evaluate the quantitative [facility]) aspects of your immune system: IgG/IgA/IgM, CBC with differential - Labs to evaluate the qualitative (HOW WELL THEY WORK) aspects of your immune system: CH50, Pneumococcal titers, Tetanus titers, Diphtheria titers - We may consider immunizations with Pneumovax and Tdap to challenge your immune system, and then obtain repeat titers in 4-6 weeks.   4. Return in about 1 week (around 05/17/2024) for ALLERGY  TESTING. You can have the follow up appointment with Dr. Iva or a Nurse Practicioner (our Nurse Practitioners are excellent and always have Physician oversight!).    Please inform us  of any Emergency Department visits, hospitalizations, or changes in symptoms. Call us  before going to the ED for breathing or allergy  symptoms since we might be able to fit you in for a sick visit. Feel free to contact us  anytime with any questions, problems, or concerns.  It was a pleasure to meet you today!  Websites that have reliable patient information: 1. American Academy of Asthma, Allergy ,  and Immunology: www.aaaai.org 2. Food Allergy  Research and Education (FARE): foodallergy.org 3. Mothers of Asthmatics: http://www.asthmacommunitynetwork.org 4. [employer] of Allergy , Asthma, and Immunology: www.acaai.org      "Like" us  on Facebook and Instagram for our latest updates!      A healthy democracy works best when [employer] participate! Make sure you are registered to vote! If you have moved or changed any of your contact information, you will need to get this updated before voting! Scan the QR codes below to learn more!

## 2024-05-17 ENCOUNTER — Ambulatory Visit: Admitting: Allergy & Immunology

## 2024-05-21 ENCOUNTER — Ambulatory Visit: Admitting: Allergy & Immunology

## 2024-05-21 DIAGNOSIS — J302 Other seasonal allergic rhinitis: Secondary | ICD-10-CM

## 2024-05-21 DIAGNOSIS — J3089 Other allergic rhinitis: Secondary | ICD-10-CM | POA: Diagnosis not present

## 2024-05-21 MED ORDER — CETIRIZINE HCL 10 MG PO TABS: 10.0000 mg | ORAL_TABLET | Freq: Every day | ORAL | 1 refills | Status: DC

## 2024-05-21 MED ORDER — AZELASTINE-FLUTICASONE 137-50 MCG/ACT NA SUSP
2.0000 | NASAL | 5 refills | Status: DC
Start: 2024-05-21 — End: 2024-07-28

## 2024-05-21 NOTE — Patient Instructions (Addendum)
 1. Moderate persistent asthma, uncomplicated - Lung testing did not look great, but you are under the care of Dr. Jude who is excellent.  - Consider adding on Fasenra for management of your breathing.   2. Chronic rhinitis - Testing today showed: ragweed, trees, indoor molds, dust mites, cat, and dog - Copy of test results provided.  - Avoidance measures provided. - Stop taking: loratadine  - Continue with: Opticrom  every 6 hours as needed - Start taking: Zyrtec  (cetirizine ) 10mg  tablet once daily and Dymista (fluticasone /azelastine ) two sprays per nostril 1-2 times daily as needed - You can use an extra dose of the antihistamine, if needed, for breakthrough symptoms.  - Consider nasal saline rinses 1-2 times daily to remove allergens from the nasal cavities as well as help with mucous clearance (this is especially helpful to do before the nasal sprays are given) - Consider allergy  shots as a means of long-term control. - Allergy  shots re-train and reset the immune system to ignore environmental allergens and decrease the resulting immune response to those allergens (sneezing, itchy watery eyes, runny nose, nasal congestion, etc).    - Allergy  shots improve symptoms in 75-85% of patients.  - We can discuss more at the next appointment if the medications are not working for you. - Allergy  shots would help with breathing and nasal symptoms.   3. Recurrent infections - We are still waiting for these to come back from the lab. - We will be in touch as they come back.,   4. Return in about 2 months (around 07/22/2024). You can have the follow up appointment with Dr. Iva or a Nurse Practicioner (our Nurse Practitioners are excellent and always have Physician oversight!).    Please inform us  of any Emergency Department visits, hospitalizations, or changes in symptoms. Call us  before going to the ED for breathing or allergy  symptoms since we might be able to fit you in for a sick visit.  Feel free to contact us  anytime with any questions, problems, or concerns.  It was a pleasure to meet you today!  Websites that have reliable patient information: 1. American Academy of Asthma, Allergy , and Immunology: www.aaaai.org 2. Food Allergy  Research and Education (FARE): foodallergy.org 3. Mothers of Asthmatics: http://www.asthmacommunitynetwork.org 4. American College of Allergy , Asthma, and Immunology: www.acaai.org      "Like" us  on Facebook and Instagram for our latest updates!      A healthy democracy works best when [employer] participate! Make sure you are registered to vote! If you have moved or changed any of your contact information, you will need to get this updated before voting! Scan the QR codes below to learn more!       Airborne Adult Perc - 05/21/24 1337     Time Antigen Placed 1337    Allergen Manufacturer Jestine    Location Back    Number of Test 55    Panel 1 Select    1. Control-Buffer 50% Glycerol Negative    2. Control-Histamine 2+    3. Bahia Negative    4. French Southern Territories Negative    5. Johnson Negative    6. [place]  Blue Negative    7. Meadow Fescue Negative    8. Perennial Rye Negative    9. Timothy Negative    10. Ragweed Mix Negative    11. Cocklebur Negative    12. Plantain,  English Negative    13. Baccharis Negative    14. Dog Fennel Negative    15. Guernsey Thistle Negative  16. Lamb's Quarters Negative    17. Sheep Sorrell Negative    18. Rough Pigweed Negative    19. Marsh Elder, Rough Negative    20. Mugwort, Common Negative    21. Box, Elder Negative    22. Cedar, red Negative    23. Sweet Gum Negative    24. Pecan Pollen Negative    25. Pine Mix Negative    26. Walnut, Black Pollen Negative    27. Red Mulberry Negative    28. Ash Mix Negative    29. Birch Mix Negative    30. Beech American Negative    31. Cottonwood, Guinea[place]iss[place] Negative    32. Hickory, White Negative    33. Maple Mix Negative    34. Oak, [place] Mix  Negative    35. Sycamore Eastern Negative    36. Alternaria Alternata Negative    37. Cladosporium Herbarum Negative    38. Aspergillus Mix Negative    39. Penicillium Mix Negative    40. Bipolaris Sorokiniana (Helminthosporium) Negative    41. Drechslera Spicifera (Curvularia) Negative    42. Mucor Plumbeus Negative    43. Fusarium Moniliforme Negative    44. Aureobasidium Pullulans (pullulara) Negative    45. Rhizopus Oryzae Negative    46. Botrytis Cinera Negative    47. Epicoccum Nigrum Negative    48. Phoma Betae Negative    49. Dust Mite Mix Negative    50. Cat Hair 10,000 BAU/ml Negative    51.  Dog Epithelia 2+    52. Mixed Feathers Negative    53. Horse Epithelia Negative    54. Cockroach, German Negative    55. Tobacco Leaf Negative          Intradermal - 05/21/24 1408     Time Antigen Placed 1415    Allergen Manufacturer Jestine    Location Arm    Number of Test 15    Control Negative    Bahia Negative    French Southern Territories Negative    Johnson Negative    7 Grass Negative    Ragweed Mix 2+    Weed Mix Negative    Tree Mix 3+    Mold 1 Negative    Mold 2 Negative    Mold 3 Negative    Mold 4 2+    Mite Mix 3+    Cat 4+    Cockroach Negative          Reducing Pollen Exposure  The American Academy of Allergy , Asthma and Immunology suggests the following steps to reduce your exposure to pollen during allergy  seasons.    Do not hang sheets or clothing out to dry; pollen may collect on these items. Do not mow lawns or spend time around freshly cut grass; mowing stirs up pollen. Keep windows closed at night.  Keep car windows closed while driving. Minimize morning activities outdoors, a time when pollen counts are usually at their highest. Stay indoors as much as possible when pollen counts or humidity is high and on windy days when pollen tends to remain in the air longer. Use air conditioning when possible.  Many air conditioners have filters that trap the pollen  spores. Use a HEPA room air filter to remove pollen form the indoor air you breathe.  Control of Mold Allergen   Mold and fungi can grow on a variety of surfaces provided certain temperature and moisture conditions exist.  Outdoor molds grow on plants, decaying vegetation and soil.  The major outdoor  mold, Alternaria and Cladosporium, are found in very high numbers during hot and dry conditions.  Generally, a late Summer - Fall peak is seen for common outdoor fungal spores.  Rain will temporarily lower outdoor mold spore count, but counts rise rapidly when the rainy period ends.  The most important indoor molds are Aspergillus and Penicillium.  Dark, humid and poorly ventilated basements are ideal sites for mold growth.  The next most common sites of mold growth are the bathroom and the kitchen.    Indoor (Perennial) Mold Control   Positive indoor molds via skin testing: Fusarium, Aureobasidium (Pullulara), and Rhizopus  Maintain humidity below 50%. Clean washable surfaces with 5% bleach solution. Remove sources e.g. contaminated carpets.    Control of Dust Mite Allergen    Dust mites play a major role in allergic asthma and rhinitis.  They occur in environments with high humidity wherever human skin is found.  Dust mites absorb humidity from the atmosphere (ie, they do not drink) and feed on organic matter (including shed human and animal skin).  Dust mites are a microscopic type of insect that you cannot see with the naked eye.  High levels of dust mites have been detected from mattresses, pillows, carpets, upholstered furniture, bed covers, clothes, soft toys and any woven material.  The principal allergen of the dust mite is found in its feces.  A gram of dust may contain 1,000 mites and 250,000 fecal particles.  Mite antigen is easily measured in the air during house cleaning activities.  Dust mites do not bite and do not cause harm to humans, other than by triggering allergies/asthma.     Ways to decrease your exposure to dust mites in your home:  Encase mattresses, box springs and pillows with a mite-impermeable barrier or cover   Wash sheets, blankets and drapes weekly in hot water (130 F) with detergent and dry them in a dryer on the hot setting.  Have the room cleaned frequently with a vacuum cleaner and a damp dust-mop.  For carpeting or rugs, vacuuming with a vacuum cleaner equipped with a high-efficiency particulate air (HEPA) filter.  The dust mite allergic individual should not be in a room which is being cleaned and should wait 1 hour after cleaning before going into the room. Do not sleep on upholstered furniture (eg, couches).   If possible removing carpeting, upholstered furniture and drapery from the home is ideal.  Horizontal blinds should be eliminated in the rooms where the person spends the most time (bedroom, study, television room).  Washable vinyl, roller-type shades are optimal. Remove all non-washable stuffed toys from the bedroom.  Wash stuffed toys weekly like sheets and blankets above.   Reduce indoor humidity to less than 50%.  Inexpensive humidity monitors can be purchased at most hardware stores.  Do not use a humidifier as can make the problem worse and are not recommended.  Control of Dog or Cat Allergen  Avoidance is the best way to manage a dog or cat allergy . If you have a dog or cat and are allergic to dog or cats, consider removing the dog or cat from the home. If you have a dog or cat but don't want to find it a new home, or if your family wants a pet even though someone in the household is allergic, here are some strategies that may help keep symptoms at bay:  Keep the pet out of your bedroom and restrict it to only a few rooms. Be advised  that keeping the dog or cat in only one room will not limit the allergens to that room. Don't pet, hug or kiss the dog or cat; if you do, wash your hands with soap and water. High-efficiency particulate air  (HEPA) cleaners run continuously in a bedroom or living room can reduce allergen levels over time. Regular use of a high-efficiency vacuum cleaner or a central vacuum can reduce allergen levels. Giving your dog or cat a bath at least once a week can reduce airborne allergen.  Allergy  Shots  Allergies are the result of a chain reaction that starts in the immune system. Your immune system controls how your body defends itself. For instance, if you have an allergy  to pollen, your immune system identifies pollen as an invader or allergen. Your immune system overreacts by producing antibodies called Immunoglobulin E (IgE). These antibodies travel to cells that release chemicals, causing an allergic reaction.  The concept behind allergy  immunotherapy, whether it is received in the form of shots or tablets, is that the immune system can be desensitized to specific allergens that trigger allergy  symptoms. Although it requires time and patience, the payback can be long-term relief. Allergy  injections contain a dilute solution of those substances that you are allergic to based upon your skin testing and allergy  history.   How Do Allergy  Shots Work?  Allergy  shots work much like a vaccine. Your body responds to injected amounts of a particular allergen given in increasing doses, eventually developing a resistance and tolerance to it. Allergy  shots can lead to decreased, minimal or no allergy  symptoms.  There generally are two phases: build-up and maintenance. Build-up often ranges from three to six months and involves receiving injections with increasing amounts of the allergens. The shots are typically given once or twice a week, though more rapid build-up schedules are sometimes used.  The maintenance phase begins when the most effective dose is reached. This dose is different for each person, depending on how allergic you are and your response to the build-up injections. Once the maintenance dose is reached,  there are longer periods between injections, typically two to four weeks.  Occasionally doctors give cortisone-type shots that can temporarily reduce allergy  symptoms. These types of shots are different and should not be confused with allergy  immunotherapy shots.  Who Can Be Treated with Allergy  Shots?  Allergy  shots may be a good treatment approach for people with allergic rhinitis (hay fever), allergic asthma, conjunctivitis (eye allergy ) or stinging insect allergy .   Before deciding to begin allergy  shots, you should consider:   The length of allergy  season and the severity of your symptoms  Whether medications and/or changes to your environment can control your symptoms  Your desire to avoid long-term medication use  Time: allergy  immunotherapy requires a major time commitment  Cost: may vary depending on your insurance coverage  Allergy  shots for children age 56 and older are effective and often well tolerated. They might prevent the onset of new allergen sensitivities or the progression to asthma.  Allergy  shots are not started on patients who are pregnant but can be continued on patients who become pregnant while receiving them. In some patients with other medical conditions or who take certain common medications, allergy  shots may be of risk. It is important to mention other medications you talk to your allergist.   What are the two types of build-ups offered:   RUSH or Rapid Desensitization -- one day of injections lasting from 8:30-4:30pm, injections every 1 hour.  Approximately half of the build-up process is completed in that one day.  The following week, normal build-up is resumed, and this entails ~16 visits either weekly or twice weekly, until reaching your "maintenance dose" which is continued weekly until eventually getting spaced out to every month for a duration of 3 to 5 years. The regular build-up appointments are nurse visits where the injections are administered,  followed by required monitoring for 30 minutes.    Traditional build-up -- weekly visits for 6 -12 months until reaching "maintenance dose", then continue weekly until eventually spacing out to every 4 weeks as above. At these appointments, the injections are administered, followed by required monitoring for 30 minutes.     Either way is acceptable, and both are equally effective. With the rush protocol, the advantage is that less time is spent here for injections overall AND you would also reach maintenance dosing faster (which is when the clinical benefit starts to become more apparent). Not everyone is a candidate for rapid desensitization.   IF we proceed with the RUSH protocol, there are premedications which must be taken the day before and the day after the rush only (this includes antihistamines, steroids, and Singulair ).  After the rush day, no prednisone  or Singulair  is required, and we just recommend antihistamines taken on your injection day.  What Is An Estimate of the Costs?  If you are interested in starting allergy  injections, please check with your insurance company about your coverage for both allergy  vial sets and allergy  injections.  Please do so prior to making the appointment to start injections.  The following are CPT codes to give to your insurance company. These are the amounts we BILL to the insurance company, but the amount YOU WILL PAY and WE RECEIVE IS SUBSTANTIALLY LESS and depends on the contracts we have with different insurance companies.   Amount Billed to Insurance One allergy  vial set  CPT 95165   $ 1200     Two allergy  vial set  CPT 95165   $ 2400     Three allergy  vial set  CPT 95165   $ 3600     One injection   CPT 95115   $ 35  Two injections   CPT 95117   $ 40 RUSH (Rapid Desensitization) CPT 95180 x 8 hours $500/hour  Regarding the allergy  injections, your co-pay may or may not apply with each injection, so please confirm this with your insurance company.  When you start allergy  injections, 1 or 2 sets of vials are made based on your allergies.  Not all patients can be on one set of vials. A set of vials lasts 6 months to a year depending on how quickly you can proceed with your build-up of your allergy  injections. Vials are personalized for each patient depending on their specific allergens.  How often are allergy  injection given during the build-up period?   Injections are given at least weekly during the build-up period until your maintenance dose is achieved. Per the doctor's discretion, you may have the option of getting allergy  injections two times per week during the build-up period. However, there must be at least 48 hours between injections. The build-up period is usually completed within 6-12 months depending on your ability to schedule injections and for adjustments for reactions. When maintenance dose is reached, your injection schedule is gradually changed to every two weeks and later to every three weeks. Injections will then continue every 4 weeks. Usually, injections are continued for  a total of 3-5 years.   When Will I Feel Better?  Some may experience decreased allergy  symptoms during the build-up phase. For others, it may take as long as 12 months on the maintenance dose. If there is no improvement after a year of maintenance, your allergist will discuss other treatment options with you.  If you aren't responding to allergy  shots, it may be because there is not enough dose of the allergen in your vaccine or there are missing allergens that were not identified during your allergy  testing. Other reasons could be that there are high levels of the allergen in your environment or major exposure to non-allergic triggers like tobacco smoke.  What Is the Length of Treatment?  Once the maintenance dose is reached, allergy  shots are generally continued for three to five years. The decision to stop should be discussed with your allergist at that  time. Some people may experience a permanent reduction of allergy  symptoms. Others may relapse and a longer course of allergy  shots can be considered.  What Are the Possible Reactions?  The two types of adverse reactions that can occur with allergy  shots are local and systemic. Common local reactions include very mild redness and swelling at the injection site, which can happen immediately or several hours after. Report a delayed reaction from your last injection. These include arm swelling or runny nose, watery eyes or cough that occurs within 12-24 hours after injection. A systemic reaction, which is less common, affects the entire body or a particular body system. They are usually mild and typically respond quickly to medications. Signs include increased allergy  symptoms such as sneezing, a stuffy nose or hives.   Rarely, a serious systemic reaction called anaphylaxis can develop. Symptoms include swelling in the throat, wheezing, a feeling of tightness in the chest, nausea or dizziness. Most serious systemic reactions develop within 30 minutes of allergy  shots. This is why it is strongly recommended you wait in your doctor's office for 30 minutes after your injections. Your allergist is trained to watch for reactions, and his or her staff is trained and equipped with the proper medications to identify and treat them.   Report to the nurse immediately if you experience any of the following symptoms: swelling, itching or redness of the skin, hives, watery eyes/nose, breathing difficulty, excessive sneezing, coughing, stomach pain, diarrhea, or light headedness. These symptoms may occur within 15-20 minutes after injection and may require medication.   Who Should Administer Allergy  Shots?  The preferred location for receiving shots is your prescribing allergist's office. Injections can sometimes be given at another facility where the physician and staff are trained to recognize and treat reactions, and  have received instructions by your prescribing allergist.  What if I am late for an injection?   Injection dose will be adjusted depending upon how many days or weeks you are late for your injection.   What if I am sick?   Please report any illness to the nurse before receiving injections. She may adjust your dose or postpone injections depending on your symptoms. If you have fever, flu, sinus infection or chest congestion it is best to postpone allergy  injections until you are better. Never get an allergy  injection if your asthma is causing you problems. If your symptoms persist, seek out medical care to get your health problem under control.  What If I am or Become Pregnant:  Women that become pregnant should schedule an appointment with [facility] before receiving  any further allergy  injections.

## 2024-05-21 NOTE — Progress Notes (Signed)
 FOLLOW UP  Date of Service/Encounter:  05/21/24   Assessment:   Moderate persistent asthma, uncomplicated   Perennial and seasonal allergic rhinitis (ragweed, trees, indoor molds, dust mites, cat, and dog)   Recurrent infections - immune screen pending   Alopecia - sees Dr. Alm  Plan/Recommendations:   1. Moderate persistent asthma, uncomplicated - Lung testing did not look great at the last visit, but you are under the care of Dr. Jude who is excellent.  - Consider adding on Fasenra for management of your breathing.   2. Chronic rhinitis - Testing today showed: ragweed, trees, indoor molds, dust mites, cat, and dog - Copy of test results provided.  - Avoidance measures provided. - Stop taking: loratadine  - Continue with: Opticrom  every 6 hours as needed - Start taking: Zyrtec  (cetirizine ) 10mg  tablet once daily and Dymista  (fluticasone /azelastine ) two sprays per nostril 1-2 times daily as needed - You can use an extra dose of the antihistamine, if needed, for breakthrough symptoms.  - Consider nasal saline rinses 1-2 times daily to remove allergens from the nasal cavities as well as help with mucous clearance (this is especially helpful to do before the nasal sprays are given) - Consider allergy  shots as a means of long-term control. - Allergy  shots re-train and reset the immune system to ignore environmental allergens and decrease the resulting immune response to those allergens (sneezing, itchy watery eyes, runny nose, nasal congestion, etc).    - Allergy  shots improve symptoms in 75-85% of patients.  - We can discuss more at the next appointment if the medications are not working for you. - Allergy  shots would help with breathing and nasal symptoms.   3. Recurrent infections - We are still waiting for these to come back from the lab. - We will be in touch as they come back.,   4. Return in about 2 months (around 07/22/2024). You can have the follow up appointment  with Dr. Iva or a Nurse Practicioner (our Nurse Practitioners are excellent and always have Physician oversight!).   Subjective:   Jane English is a 53 y.o. female presenting today for follow up of  Chief Complaint  Patient presents with   Allergy  Testing    Jane English has a history of the following: Patient Active Problem List   Diagnosis Date Noted   Chest pain 04/05/2024   Discolored nails 03/25/2024   Bronchitis 01/06/2024   Severe persistent asthma with acute exacerbation 01/06/2024   Encounter for examination following treatment at hospital 01/06/2024   Atypical squamous cell changes of undetermined significance (ASCUS) on cervical cytology with negative high risk human papilloma virus (HPV) test result 12/22/2023   Depression, major, single episode, moderate (HCC) 12/22/2023   GAD (generalized anxiety disorder) 12/22/2023   Sinusitis, acute 12/17/2023   Bronchitis, acute 10/16/2023   Acute sinusitis 10/16/2023   Dry eyes, bilateral 10/16/2023   Encounter for annual general medical examination with abnormal findings in adult 07/01/2023   Neck pain, bilateral 07/01/2023   Hyperlipidemia LDL goal <100 07/01/2023   Folliculitis 04/07/2023   Dermatitis 02/25/2023   Pruritus 02/25/2023   Primary osteoarthritis of left knee 08/01/2022   Acute lateral meniscus tear of left knee 07/05/2022   Type 2 diabetes mellitus with other specified complication (HCC) 03/21/2022   Anxiety 12/16/2020   Left foot pain 05/01/2020   Great toe pain, left 05/01/2020   Back spasm 04/10/2020   Urinary urgency 08/11/2019   Overweight (BMI 25.0-29.9) 04/26/2019   Anxiety and  depression 04/26/2019   Arthralgia of left shoulder region 11/05/2018   Depression, major, single episode, severe (HCC) 10/03/2018   Vitamin D  deficiency 12/14/2017   Bilateral ankle pain 04/16/2017   Amenorrhea 10/15/2016   Paresthesia 05/06/2016   Back pain with radiation 04/04/2016   Obesity (BMI 30.0-34.9)  03/25/2016   Neuropathic pain of finger of right hand 12/31/2015   Pap smear of cervix shows high risk HPV present 08/08/2015   Chronic sinusitis 05/28/2015   Primary osteoarthritis of right knee 03/17/2015   Cyst of left ovary 02/06/2015   Anovulation 02/06/2015   Secondary amenorrhea 02/06/2015   Intractable chronic migraine without aura and without status migrainosus 12/21/2014   Migraine 09/21/2014   Heart murmur 05/13/2014   Abdominal adhesions 02/15/2014   Anemia 10/11/2013   H. pylori duodenitis 07/07/2013   PTSD (post-traumatic stress disorder) 03/26/2013   Insomnia 03/18/2013   GERD (gastroesophageal reflux disease) 10/29/2012   Chronic pain syndrome 10/06/2012   Postlaminectomy syndrome 10/06/2012   Onychomycosis 09/02/2012   Cervical neck pain with evidence of disc disease 04/13/2012   Goiter 06/07/2011   Allergic rhinitis 04/17/2010   Cough variant asthma 03/04/2010   Essential hypertension 11/27/2007    History obtained from: chart review and patient.  Discussed the use of AI scribe software for clinical note transcription with the patient and/or guardian, who gave verbal consent to proceed.  Jane English is a 53 y.o. female presenting for skin testing. She was last seen on July 7th. We could not do testing because her insurance company does not cover testing on the same day as a New Patient visit. She has been off of all antihistamines 3 days in anticipation of the testing.   At the last visit, lung testing did not look great. She was already being followed by Dr. Jude.  For her rhinitis, we decided to do environmental allergy  testing.  We also looked at her immune system.  She was already on Flonase  and an antihistamine which were not controlling her nose adequately.  A sinus CT in March 2025 showed right septal deviation and paranasal sinus disease with bilateral maxillary sinus disease.  Otherwise, there have been no changes to her past medical history, surgical history,  family history, or social history.    Review of systems otherwise negative other than that mentioned in the HPI.    Objective:   There were no vitals taken for this visit. There is no height or weight on file to calculate BMI.    Physical exam deferred since this was a skin testing appointment only.   Diagnostic studies:   Allergy  Studies:    Airborne Adult Perc - 05/21/24 1337     Time Antigen Placed 1337    Allergen Manufacturer Jestine    Location Back    Number of Test 55    Panel 1 Select    1. Control-Buffer 50% Glycerol Negative    2. Control-Histamine 2+    3. Bahia Negative    4. French Southern Territories Negative    5. Johnson Negative    6. [place]  Blue Negative    7. Meadow Fescue Negative    8. Perennial Rye Negative    9. Timothy Negative    10. Ragweed Mix Negative    11. Cocklebur Negative    12. Plantain,  English Negative    13. Baccharis Negative    14. Dog Fennel Negative    15. Russian Thistle Negative    16. Lamb's Quarters Negative    17.  Sheep Sorrell Negative    18. Rough Pigweed Negative    19. Marsh Elder, Rough Negative    20. Mugwort, Common Negative    21. Box, Elder Negative    22. Cedar, red Negative    23. Sweet Gum Negative    24. Pecan Pollen Negative    25. Pine Mix Negative    26. Walnut, Black Pollen Negative    27. Red Mulberry Negative    28. Ash Mix Negative    29. Birch Mix Negative    30. Beech American Negative    31. Cottonwood, Guinea-Bissau Negative    32. Hickory, White Negative    33. Maple Mix Negative    34. Oak, Guinea-[place]iss[place] Mix Negative    35. Sycamore Eastern Negative    36. Alternaria Alternata Negative    37. Cladosporium Herbarum Negative    38. Aspergillus Mix Negative    39. Penicillium Mix Negative    40. Bipolaris Sorokiniana (Helminthosporium) Negative    41. Drechslera Spicifera (Curvularia) Negative    42. Mucor Plumbeus Negative    43. Fusarium Moniliforme Negative    44. Aureobasidium Pullulans (pullulara)  Negative    45. Rhizopus Oryzae Negative    46. Botrytis Cinera Negative    47. Epicoccum Nigrum Negative    48. Phoma Betae Negative    49. Dust Mite Mix Negative    50. Cat Hair 10,000 BAU/ml Negative    51.  Dog Epithelia 2+    52. Mixed Feathers Negative    53. Horse Epithelia Negative    54. Cockroach, German Negative    55. Tobacco Leaf Negative          Intradermal - 05/21/24 1408     Time Antigen Placed 1415    Allergen Manufacturer Jestine    Location Arm    Number of Test 15    Control Negative    Bahia Negative    French Southern Territories Negative    Johnson Negative    7 Grass Negative    Ragweed Mix 2+    Weed Mix Negative    Tree Mix 3+    Mold 1 Negative    Mold 2 Negative    Mold 3 Negative    Mold 4 2+    Mite Mix 3+    Cat 4+    Cockroach Negative            Allergy  testing results were read and interpreted by myself, documented by clinical staff.      Marty Shaggy, MD  [facility] 

## 2024-05-25 ENCOUNTER — Encounter: Payer: Self-pay | Admitting: Allergy & Immunology

## 2024-05-27 ENCOUNTER — Telehealth: Payer: Self-pay | Admitting: Allergy & Immunology

## 2024-05-27 ENCOUNTER — Encounter (INDEPENDENT_AMBULATORY_CARE_PROVIDER_SITE_OTHER): Payer: Self-pay | Admitting: Otolaryngology

## 2024-05-27 ENCOUNTER — Ambulatory Visit (INDEPENDENT_AMBULATORY_CARE_PROVIDER_SITE_OTHER): Admitting: Otolaryngology

## 2024-05-27 VITALS — BP 132/82 | HR 94

## 2024-05-27 DIAGNOSIS — J342 Deviated nasal septum: Secondary | ICD-10-CM

## 2024-05-27 DIAGNOSIS — R0981 Nasal congestion: Secondary | ICD-10-CM

## 2024-05-27 DIAGNOSIS — K029 Dental caries, unspecified: Secondary | ICD-10-CM | POA: Diagnosis not present

## 2024-05-27 DIAGNOSIS — J343 Hypertrophy of nasal turbinates: Secondary | ICD-10-CM | POA: Diagnosis not present

## 2024-05-27 DIAGNOSIS — J3489 Other specified disorders of nose and nasal sinuses: Secondary | ICD-10-CM | POA: Diagnosis not present

## 2024-05-27 DIAGNOSIS — J3089 Other allergic rhinitis: Secondary | ICD-10-CM | POA: Diagnosis not present

## 2024-05-27 DIAGNOSIS — R0982 Postnasal drip: Secondary | ICD-10-CM

## 2024-05-27 NOTE — Telephone Encounter (Signed)
 PT called to advise that she had allergy  test on Friday and was wanting to schedule follow up apt to start injections. Reviewed chart and advised Dr Iva is okay with starting injections, but that she should call her insurance first to make sure we know what we can prescribe. She thanked and advised would call them and give us  a call back to schedule

## 2024-05-27 NOTE — Patient Instructions (Signed)
 Lloyd Huger Med Nasal Saline Rinse   - start nasal saline rinses with NeilMed Bottle available over the counter or online to help with nasal congestion

## 2024-05-27 NOTE — Progress Notes (Signed)
 ENT CONSULT:  Reason for Consult: chronic nasal congestion    HPI: Discussed the use of AI scribe software for clinical note transcription with the patient, who gave verbal consent to proceed.  History of Present Illness Jane English is a 53 year old female who presents with chronic nasal congestion. She was referred by Dr. Antonetta for evaluation of sinus issues. CT sinuses was done.   She experienced significant nasal congestion, difficulty breathing, and persistent headaches when CT sinuses was done a few months ago.   She uses Sudafed, nasal sprays, and allergy  pills to manage her symptoms. She recently had allergy  testing. No prior allergy  testing before the recent one in July, which showed multiple allergies to mold, grass, cats, roaches.   Additionally, there were findings of lucencies in her premolar teeth on the right side, but she has not been informed of this by her dentist.     Records Reviewed:  Dr Iva Allergy  Office visit 05/21/24 1. Moderate persistent asthma, uncomplicated - Lung testing did not look great at the last visit, but you are under the care of Dr. Jude who is excellent.  - Consider adding on Fasenra for management of your breathing.    2. Chronic rhinitis - Testing today showed: ragweed, trees, indoor molds, dust mites, cat, and dog - Copy of test results provided.  - Avoidance measures provided. - Stop taking: loratadine  - Continue with: Opticrom  every 6 hours as needed - Start taking: Zyrtec  (cetirizine ) 10mg  tablet once daily and Dymista  (fluticasone /azelastine ) two sprays per nostril 1-2 times daily as needed - You can use an extra dose of the antihistamine, if needed, for breakthrough symptoms.  - Consider nasal saline rinses 1-2 times daily to remove allergens from the nasal cavities as well as help with mucous clearance (this is especially helpful to do before the nasal sprays are given) - Consider allergy  shots as a means of long-term  control. - Allergy  shots re-train and reset the immune system to ignore environmental allergens and decrease the resulting immune response to those allergens (sneezing, itchy watery eyes, runny nose, nasal congestion, etc).    - Allergy  shots improve symptoms in 75-85% of patients.  - We can discuss more at the next appointment if the medications are not working for you. - Allergy  shots would help with breathing and nasal symptoms.    3. Recurrent infections - We are still waiting for these to come back from the lab. - We will be in touch as they come back.,    4. Return in about 2 months (around 07/22/2024). You can have the follow up appointment with Dr. Iva or a Nurse Practicioner (our Nurse Practitioners are excellent and always have Physician oversight!).     Past Medical History:  Diagnosis Date   Anemia    Asthma    Asthma flare 04/09/2013   Back pain    Bronchitis    Chronic abdominal pain    Chronic constipation    Constipation due to opioid therapy    Depression    Depression, major, single episode, severe (HCC) 10/03/2018   PHQ 9 score of 15   Diabetes mellitus without complication (HCC)    Diabetes mellitus, type II (HCC)    DVT (deep venous thrombosis) (HCC) 2010   GERD (gastroesophageal reflux disease)    Heart murmur    no cardiologist   Helicobacter pylori gastritis 06/11/2013   Colonoscopy Dr. Albertus   Hypertension    IBS (irritable bowel syndrome)  Migraine headache    Neuropathy    Obesity    Obsessive-compulsive disorder    PSYCHOTIC D/O W/HALLUCINATIONS CONDS CLASS ELSW 03/04/2010   Qualifier: Diagnosis of  By: Antonetta MD, Margaret     PTSD (post-traumatic stress disorder)    SBO (small bowel obstruction) (HCC) 08/09/2013   Seasonal allergies 12/10/2012   Seizures (HCC)    Shortness of breath     Past Surgical History:  Procedure Laterality Date   ADENOIDECTOMY     ANTERIOR CERVICAL DECOMP/DISCECTOMY FUSION  07/07/2012   Procedure: ANTERIOR  CERVICAL DECOMPRESSION/DISCECTOMY FUSION 2 LEVELS;  Surgeon: Catalina CHRISTELLA Stains, MD;  Location: [facility];  Service: Neurosurgery;  Laterality: N/A;  Cervical four-five, five - six  Anterior cervical decompression/diskectomy/fusion/plate   APPENDECTOMY  11/04/1984   BOWEL RESECTION N/A 07/29/2013   Procedure: serosal repair;  Surgeon: Elspeth KYM Schultze, MD;  Location: [facility];  Service: General;  Laterality: N/A;   CARPAL TUNNEL RELEASE Bilateral    COLON SURGERY N/A    Phreesia 07/29/2020   LAPAROSCOPY N/A 07/29/2013   Procedure: diagnostic laporoscopy;  Surgeon: Elspeth KYM Schultze, MD;  Location: [facility];  Service: General;  Laterality: N/A;   LAPAROSCOPY N/A 08/16/2013   Procedure: LAPAROSCOPY DIAGNOSTIC/LYSIS OF ADHESIONS;  Surgeon: Elspeth KYM Schultze, MD;  Location: [facility];  Service: General;  Laterality: N/A;   LAPAROTOMY N/A 08/16/2013   Procedure: EXPLORATORY LAPAROTOMY/SMALL BOWEL RESECTION (JEJUNUM);  Surgeon: Elspeth KYM Schultze, MD;  Location: [facility];  Service: General;  Laterality: N/A;   LUMBAR SPINE SURGERY  11/04/2008   x 3   LYSIS OF ADHESION  11/04/2001   Dr. Keven Sharps   LYSIS OF ADHESION N/A 07/29/2013   Procedure: LYSIS OF ADHESION;  Surgeon: Elspeth KYM Schultze, MD;  Location: [facility];  Service: General;  Laterality: N/A;   OOPHORECTOMY     PARTIAL HYSTERECTOMY  1990s?   [place], [place]   SPINAL CORD STIMULATOR IMPLANT     spine stimulator removal  12/13/2022   SPINE SURGERY N/A    Phreesia 07/29/2020   TRIGGER FINGER RELEASE  11/05/2007   right pinkie finger   TUBAL LIGATION  11/04/1992    Family History  Problem Relation Age of Onset   Physical abuse Mother    Alcohol abuse Mother    Cirrhosis Mother    Lung cancer Father    Stomach cancer Father    Esophageal cancer Father    Alcohol abuse Father    Mental illness Father    Diabetes Sister    Hypertension Sister    Bipolar disorder Sister    Schizophrenia Sister    Diabetes Sister    Drug abuse Sister    HIV  Sister    Pneumonia Sister        died as a baby   Alcohol abuse Brother    Hypertension Brother    Kidney disease Brother    Diabetes Brother    Drug abuse Brother    Mental illness Brother    Alcohol abuse Brother    Alcohol abuse Brother    Hypertension Brother    Diabetes Brother    Alcohol abuse Brother    Mental illness Brother        in [place]   Alcohol abuse Brother    Alcohol abuse Brother    Bipolar disorder Brother    Hypertension Brother    Bipolar disorder Brother    Drug abuse Brother    Alcohol abuse Brother  Bipolar disorder Brother    Bipolar disorder Daughter    Bipolar disorder Son    Bipolar disorder Son    ADD / ADHD Neg Hx    Anxiety disorder Neg Hx    Dementia Neg Hx    Depression Neg Hx    OCD Neg Hx    Seizures Neg Hx    Paranoid behavior Neg Hx    Colon cancer Neg Hx    Rectal cancer Neg Hx     Social History:  reports that she has never smoked. She has never been exposed to tobacco smoke. She has never used smokeless tobacco. She reports that she does not drink alcohol and does not use drugs.  Allergies:  Allergies  Allergen Reactions   Neurontin [Gabapentin] Nausea And Vomiting and Other (See Comments)    Sleep walking/hallucinations    Penicillins Hives, Shortness Of Breath and Other (See Comments)    Has patient had a PCN reaction causing immediate rash, facial/tongue/throat swelling, SOB or lightheadedness with hypotension: Yes Has patient had a PCN reaction causing severe rash involving mucus membranes or skin necrosis: No Has patient had a PCN reaction that required hospitalization: No Has patient had a PCN reaction occurring within the last 10 years: Yes If all of the above answers are NO, then may proceed with Cephalosporin use.   Pregabalin Anaphylaxis, Shortness Of Breath, Diarrhea and Swelling    Lyrica   Tramadol  Other (See Comments)    Makes patient delusional   Latex Rash    Medications: I have reviewed the  patient's current medications.  The PMH, PSH, Medications, Allergies, and SH were reviewed and updated.  ROS: Constitutional: Negative for fever, weight loss and weight gain. Cardiovascular: Negative for chest pain and dyspnea on exertion. Respiratory: Is not experiencing shortness of breath at rest. Gastrointestinal: Negative for nausea and vomiting. Neurological: Negative for headaches. Psychiatric: The patient is not nervous/anxious  Blood pressure 132/82, pulse 94, SpO2 95%. There is no height or weight on file to calculate BMI.  PHYSICAL EXAM:  Exam: General: Well-developed, well-nourished Respiratory Respiratory effort: Equal inspiration and expiration without stridor Cardiovascular Peripheral Vascular: Warm extremities with equal color/perfusion Eyes: No nystagmus with equal extraocular motion bilaterally Neuro/Psych/Balance: Patient oriented to person, place, and time; Appropriate mood and affect; Gait is intact with no imbalance; Cranial nerves I-XII are intact Head and Face Inspection: Normocephalic and atraumatic without mass or lesion Palpation: Facial skeleton intact without bony stepoffs Salivary Glands: No mass or tenderness Facial Strength: Facial motility symmetric and full bilaterally ENT Pinna: External ear intact and fully developed External canal: Canal is patent with intact skin Tympanic Membrane: Clear and mobile External Nose: No scar or anatomic deformity Internal Nose: Septum is with slight deviation to the left. No polyp, or purulence. Mucosal edema and erythema present.  Bilateral inferior turbinate hypertrophy.  Lips, Teeth, and gums: Mucosa and teeth intact and viable TMJ: No pain to palpation with full mobility Oral cavity/oropharynx: No erythema or exudate, no lesions present Neck Neck and Trachea: Midline trachea without mass or lesion Thyroid : No mass or nodularity Lymphatics: No lymphadenopathy  Procedure:   PROCEDURE NOTE: nasal  endoscopy  Preoperative diagnosis: chronic sinusitis symptoms  Postoperative diagnosis: same  Procedure: Diagnostic nasal endoscopy ([id number])  Surgeon: Elena Larry, M.D.  Anesthesia: Topical lidocaine  and Afrin  H&P REVIEW: The patient's history and physical were reviewed today prior to procedure. All medications were reviewed and updated as well. Complications: None Condition is stable throughout exam Indications and  consent: The patient presents with symptoms of chronic sinusitis not responding to previous therapies. All the risks, benefits, and potential complications were reviewed with the patient preoperatively and informed consent was obtained. The time out was completed with confirmation of the correct procedure.   Procedure: The patient was seated upright in the clinic. Topical lidocaine  and Afrin were applied to the nasal cavity. After adequate anesthesia had occurred, the rigid nasal endoscope was passed into the nasal cavity. The nasal mucosa, turbinates, septum, and sinus drainage pathways were visualized bilaterally. This revealed no purulence or significant secretions that might be cultured. There were no polyps or sites of significant inflammation. The mucosa was intact and there was no crusting present. The scope was then slowly withdrawn and the patient tolerated the procedure well. There were no complications or blood loss.   Studies Reviewed: CT sinuses 01/05/24  IMPRESSION: Paranasal sinus disease as above. Air-fluid levels and frothy secretions in the bilateral maxillary sinuses, recommend clinical correlation for acute sinusitis.   Mucosal thickening in the left middle meatus and at the left maxillary sinus ostium contributing to ostiomeatal occlusion.   Mucosal thickening in the ethmoid sinuses, left greater than right, with involvement of the sphenoethmoidal recesses bilaterally.   Rightward nasal septal deviation.   Periapical lucencies involving the right  maxillary cuspid and first premolar are new since 2018.  Allergy  test 05/21/24  Assessment/Plan: No diagnosis found.  Assessment and Plan Assessment & Plan Chronic nasal congestion and environmental allergies  Minimal mucosal thickening on the left side based on CT sinuses, otherwise sinuses are clear, nasal endoscopy today with mild ITH and mucosal edema, but no pus or polyps.  - Continue Dymista , and Zyrtec  10 mg daily - Recommend nasal saline irrigation with distilled water and salt packet. She already has a bottle. - Follow up with allergist for potential allergy  shots.  Dental caries CT scan showed lucencies in right premolar teeth, suggestive of dental caries. - Consult with a dentist for evaluation of right premolar teeth. - Obtain a CD of the CT scan from radiology for dental consultation.   Thank you for allowing me to participate in the care of this patient. Please do not hesitate to contact me with any questions or concerns.   Elena Larry, MD Otolaryngology [facility] Phone: [phone] Fax: [phone]    05/27/2024, 10:07 AM

## 2024-05-29 LAB — STREP PNEUMONIAE 23 SEROTYPES IGG
Pneumo Ab Type 1*: 2.4 ug/mL (ref >1.3)
Pneumo Ab Type 12 (12F)*: 0.2 ug/mL — ABNORMAL LOW (ref >1.3)
Pneumo Ab Type 14*: 0.3 ug/mL — ABNORMAL LOW (ref >1.3)
Pneumo Ab Type 17 (17F)*: 0.5 ug/mL — ABNORMAL LOW (ref >1.3)
Pneumo Ab Type 19 (19F)*: 0.6 ug/mL — ABNORMAL LOW (ref >1.3)
Pneumo Ab Type 2*: 21.4 ug/mL (ref >1.3)
Pneumo Ab Type 20*: 0.5 ug/mL — ABNORMAL LOW (ref >1.3)
Pneumo Ab Type 22 (22F)*: 0.3 ug/mL — ABNORMAL LOW (ref >1.3)
Pneumo Ab Type 23 (23F)*: 0.7 ug/mL — ABNORMAL LOW (ref >1.3)
Pneumo Ab Type 26 (6B)*: <0.1 ug/mL — ABNORMAL LOW (ref >1.3)
Pneumo Ab Type 3*: <0.1 ug/mL — ABNORMAL LOW (ref >1.3)
Pneumo Ab Type 34 (10A)*: <0.1 ug/mL — ABNORMAL LOW (ref >1.3)
Pneumo Ab Type 4*: 0.5 ug/mL — ABNORMAL LOW (ref >1.3)
Pneumo Ab Type 43 (11A)*: 0.1 ug/mL — ABNORMAL LOW (ref >1.3)
Pneumo Ab Type 5*: <0.1 ug/mL — ABNORMAL LOW (ref >1.3)
Pneumo Ab Type 51 (7F)*: 0.5 ug/mL — ABNORMAL LOW (ref >1.3)
Pneumo Ab Type 54 (15B)*: 16.3 ug/mL (ref >1.3)
Pneumo Ab Type 56 (18C)*: 0.4 ug/mL — ABNORMAL LOW (ref >1.3)
Pneumo Ab Type 57 (19A)*: 0.8 ug/mL — ABNORMAL LOW (ref >1.3)
Pneumo Ab Type 68 (9V)*: 1.3 ug/mL — ABNORMAL LOW (ref >1.3)
Pneumo Ab Type 70 (33F)*: 1.1 ug/mL — ABNORMAL LOW (ref >1.3)
Pneumo Ab Type 8*: 11.6 ug/mL (ref >1.3)
Pneumo Ab Type 9 (9N)*: 0.1 ug/mL — ABNORMAL LOW (ref >1.3)

## 2024-05-29 LAB — CBC WITH DIFF/PLATELET
Basophils Absolute: 0.1 x10E3/uL (ref 0.0–0.2)
Basos: 1 %
EOS (ABSOLUTE): 0.6 x10E3/uL — ABNORMAL HIGH (ref 0.0–0.4)
Eos: 12 %
Hematocrit: 39.4 % (ref 34.0–46.6)
Hemoglobin: 13.3 g/dL (ref 11.1–15.9)
Immature Grans (Abs): 0 x10E3/uL (ref 0.0–0.1)
Immature Granulocytes: 0 %
Lymphocytes Absolute: 1.9 x10E3/uL (ref 0.7–3.1)
Lymphs: 37 %
MCH: 31 pg (ref 26.6–33.0)
MCHC: 33.8 g/dL (ref 31.5–35.7)
MCV: 92 fL (ref 79–97)
Monocytes Absolute: 0.4 x10E3/uL (ref 0.1–0.9)
Monocytes: 8 %
Neutrophils Absolute: 2.2 x10E3/uL (ref 1.4–7.0)
Neutrophils: 42 %
Platelets: 285 x10E3/uL (ref 150–450)
RBC: 4.29 x10E6/uL (ref 3.77–5.28)
RDW: 12.4 % (ref 11.7–15.4)
WBC: 5.3 x10E3/uL (ref 3.4–10.8)

## 2024-05-29 LAB — DIPHTHERIA / TETANUS ANTIBODY PANEL
Diphtheria Ab: 0.78 [IU]/mL (ref <0.10)
Tetanus Ab, IgG: 1.95 [IU]/mL (ref <0.10)

## 2024-05-29 LAB — IGG, IGA, IGM
IgA/Immunoglobulin A, Serum: 360 mg/dL — ABNORMAL HIGH (ref 87–352)
IgG (Immunoglobin G), Serum: 1103 mg/dL (ref 586–1602)
IgM (Immunoglobulin M), Srm: 107 mg/dL (ref 26–217)

## 2024-05-29 LAB — COMPLEMENT, TOTAL: Compl, Total (CH50): >60 U/mL (ref >41)

## 2024-05-31 ENCOUNTER — Ambulatory Visit (INDEPENDENT_AMBULATORY_CARE_PROVIDER_SITE_OTHER): Admitting: Registered Nurse

## 2024-05-31 ENCOUNTER — Encounter (HOSPITAL_COMMUNITY): Payer: Self-pay | Admitting: Registered Nurse

## 2024-05-31 VITALS — BP 115/70 | HR 99 | Temp 98.0°F | Ht 66.0 in | Wt 172.0 lb

## 2024-05-31 DIAGNOSIS — F331 Major depressive disorder, recurrent, moderate: Secondary | ICD-10-CM

## 2024-05-31 DIAGNOSIS — F411 Generalized anxiety disorder: Secondary | ICD-10-CM | POA: Diagnosis not present

## 2024-05-31 MED ORDER — FLUOXETINE HCL 10 MG PO CAPS: 10.0000 mg | ORAL_CAPSULE | Freq: Every day | ORAL | 0 refills | Status: DC

## 2024-05-31 MED ORDER — HYDROXYZINE HCL 10 MG PO TABS: ORAL_TABLET | ORAL | 0 refills | Status: DC
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
## 2024-05-31 NOTE — Patient Instructions (Addendum)
 To help better understand what type of therapy you want.  Types of Psychotherapy Interpersonal Psychotherapy (IPT) Focuses on treating depression related to significant loss, life changes, or interpersonal conflict. Improves interpersonal relationships and social skills. Recommended for mood disorders, anxiety, PTSD, bipolar disorder, eating disorders, postpartum depression, and borderline personality disorder. Cognitive Behavioral Therapy (CBT) Identifies and addresses negative thought patterns and beliefs. Goal-oriented and focuses on solving current challenges. Effective for addiction, depression, OCD, anxiety, chronic pain, bipolar disorder, anger management, personality disorders, eating disorders, marital conflict, and academic performance. Dialectical Behavioral Therapy (DBT) Balances acceptance and change by integrating opposing ideas. Helps identify and modify distressing behaviors. Initially developed for borderline personality disorder, but adapted for other conditions. Psychoanalytical and Psychodynamic Therapy Uncovers unconscious thoughts linked to childhood experiences. Long-term treatment performed by specially trained professionals. Used for chronic depression, anxiety disorders, somatic disorders, borderline personality disorder, PTSD, substance use disorders, and eating disorders. Humanistic Therapy Focuses on self-awareness and acceptance to reach full potential. Person-centered approach allowing clients to guide sessions. Recommended for trauma, depression, chronic conditions, anxiety, low self-esteem, relationship conflicts, personality disorders, addictions, and existential crises. Eclectic Therapy Combines techniques from different types of psychotherapy. Flexible and adapts to individual needs and goals. Can be short-term or long-term, often used for PTSD symptoms. Choosing the Right Therapy Consider the type of concern, duration, previous diagnosis, and whether  you want to understand or change behaviors. Consultations with multiple therapists can help determine the best approach. Check therapist credentials and training. Formats of Therapy Individual Therapy: One-on-one sessions. Group Therapy: Sessions with multiple participants sharing common goals. Couple's or Family Therapy: Sessions involving family members or partners. Effectiveness of Psychotherapy Rooted in science and evidence-based. Effective for a variety of mental health conditions and personal challenges. Requires active participation and collaboration. Online Therapy As effective as in-person therapy. Ensure privacy, verify therapist credentials, and maintain a conducive environment for sessions. This overview should give you a good understanding of the different psychotherapy options and how they can help.   Call 911, 988, mobile crisis, or present to the nearest emergency room should you experience any suicidal/homicidal ideation, auditory/visual/hallucinations, or detrimental worsening of your mental health.  Mobile Crisis Response Teams Listed by counties in vicinity of [facility] providers [facility] [phone] [facility] [phone] [facility] [phone] [facility] [phone] [place]                * [facility] [phone]                * [facility] [phone]  [facility] [phone] [facility] [employer]  [phone] * [facility] [phone]

## 2024-06-01 ENCOUNTER — Ambulatory Visit: Payer: Self-pay | Admitting: Allergy & Immunology

## 2024-06-02 ENCOUNTER — Other Ambulatory Visit: Payer: Self-pay | Admitting: Physical Medicine and Rehabilitation

## 2024-06-05 ENCOUNTER — Other Ambulatory Visit: Payer: Self-pay | Admitting: Family Medicine

## 2024-06-09 ENCOUNTER — Ambulatory Visit (HOSPITAL_COMMUNITY): Admitting: Psychiatry

## 2024-06-14 ENCOUNTER — Ambulatory Visit (INDEPENDENT_AMBULATORY_CARE_PROVIDER_SITE_OTHER): Admitting: Registered Nurse

## 2024-06-14 ENCOUNTER — Encounter (HOSPITAL_COMMUNITY): Payer: Self-pay | Admitting: Registered Nurse

## 2024-06-14 DIAGNOSIS — F411 Generalized anxiety disorder: Secondary | ICD-10-CM

## 2024-06-14 DIAGNOSIS — F331 Major depressive disorder, recurrent, moderate: Secondary | ICD-10-CM | POA: Diagnosis not present

## 2024-06-14 MED ORDER — FLUOXETINE HCL 10 MG PO CAPS: 10.0000 mg | ORAL_CAPSULE | Freq: Every day | ORAL | Status: DC

## 2024-06-14 MED ORDER — HYDROXYZINE HCL 10 MG PO TABS: ORAL_TABLET | ORAL | Status: DC
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
## 2024-06-14 NOTE — Patient Instructions (Signed)
 Call 911, [phone], mobile crisis, or present to the nearest emergency room should you experience any suicidal/homicidal ideation, auditory/visual/hallucinations, or detrimental worsening of your mental health.  Mobile Crisis Response Teams Listed by counties in vicinity of [facility] providers [facility] [employer] [phone] [facility] [phone] [facility] [phone] [facility] [phone] [place]                * [facility] [phone]                * [facility] [phone]  [facility] [phone] [facility] [employer]  [phone] * [employer] [phone]

## 2024-06-21 ENCOUNTER — Telehealth (HOSPITAL_COMMUNITY): Payer: Self-pay

## 2024-06-21 ENCOUNTER — Other Ambulatory Visit (HOSPITAL_COMMUNITY): Payer: Self-pay | Admitting: Registered Nurse

## 2024-06-21 DIAGNOSIS — F411 Generalized anxiety disorder: Secondary | ICD-10-CM

## 2024-06-21 MED ORDER — HYDROXYZINE HCL 10 MG PO TABS: ORAL_TABLET | ORAL | 1 refills | Status: DC

## 2024-06-21 MED ORDER — HYDROXYZINE HCL 25 MG PO TABS: 25.0000 mg | ORAL_TABLET | Freq: Three times a day (TID) | ORAL | 0 refills | Status: DC | PRN

## 2024-06-21 MED ORDER — HYDROXYZINE HCL 25 MG PO TABS: 25.0000 mg | ORAL_TABLET | Freq: Three times a day (TID) | ORAL | Status: DC | PRN

## 2024-06-21 NOTE — Telephone Encounter (Signed)
 pt states that she needs a refill on the hydroxyzine . pt was last seen on 8-11 next appt 8-25.

## 2024-06-21 NOTE — Telephone Encounter (Signed)
 pt notified that rx was sent. pt asked if it was 20mg  that was sent.

## 2024-06-22 ENCOUNTER — Encounter (HOSPITAL_BASED_OUTPATIENT_CLINIC_OR_DEPARTMENT_OTHER)

## 2024-06-22 ENCOUNTER — Ambulatory Visit (HOSPITAL_BASED_OUTPATIENT_CLINIC_OR_DEPARTMENT_OTHER): Admitting: Adult Health

## 2024-06-23 ENCOUNTER — Encounter: Payer: Self-pay | Admitting: Adult Health

## 2024-06-23 ENCOUNTER — Ambulatory Visit (INDEPENDENT_AMBULATORY_CARE_PROVIDER_SITE_OTHER): Admitting: Pulmonary Disease

## 2024-06-23 ENCOUNTER — Ambulatory Visit (INDEPENDENT_AMBULATORY_CARE_PROVIDER_SITE_OTHER): Admitting: Adult Health

## 2024-06-23 VITALS — BP 106/48 | HR 95 | Temp 98.4°F | Ht 66.2 in | Wt 170.4 lb

## 2024-06-23 DIAGNOSIS — J4540 Moderate persistent asthma, uncomplicated: Secondary | ICD-10-CM

## 2024-06-23 DIAGNOSIS — J324 Chronic pansinusitis: Secondary | ICD-10-CM | POA: Diagnosis not present

## 2024-06-23 LAB — PULMONARY FUNCTION TEST
FEF 25-75 Post: 1.7 L/s
FEF 25-75 Pre: 1.52 L/s
FEF2575-%Change-Post: 11 %
FEF2575-%Pred-Post: 63 %
FEF2575-%Pred-Pre: 56 %
FEV1-%Change-Post: 3 %
FEV1-%Pred-Post: 62 %
FEV1-%Pred-Pre: 60 %
FEV1-Post: 1.82 L
FEV1-Pre: 1.76 L
FEV1FVC-%Change-Post: 2 %
FEV1FVC-%Pred-Pre: 96 %
FEV6-%Change-Post: 1 %
FEV6-%Pred-Post: 64 %
FEV6-%Pred-Pre: 63 %
FEV6-Post: 2.32 L
FEV6-Pre: 2.28 L
FEV6FVC-%Change-Post: 0 %
FEV6FVC-%Pred-Post: 103 %
FEV6FVC-%Pred-Pre: 102 %
FVC-%Change-Post: 1 %
FVC-%Pred-Post: 62 %
FVC-%Pred-Pre: 61 %
FVC-Post: 2.32 L
FVC-Pre: 2.3 L
Post FEV1/FVC ratio: 78 %
Post FEV6/FVC ratio: 100 %
Pre FEV1/FVC ratio: 77 %
Pre FEV6/FVC Ratio: 100 %

## 2024-06-23 MED ORDER — MONTELUKAST SODIUM 10 MG PO TABS: ORAL_TABLET | ORAL | 5 refills | Status: DC

## 2024-06-23 MED ORDER — TRELEGY ELLIPTA 200-62.5-25 MCG/ACT IN AEPB: 1.0000 | INHALATION_SPRAY | Freq: Every day | RESPIRATORY_TRACT | 5 refills | Status: DC

## 2024-06-23 NOTE — Patient Instructions (Addendum)
 Follow up with Dentist.   Begin Saline nasals spray/rinse Twice daily   Begin Flonase  1 puff daily  Begin Zyrtec  5mg  At bedtime  -(low dose)  Begin Singulair  10mg  At bedtime   Stop Breo  Begin Trelegy 1 puff daily  Albuterol  inhaler or neb As needed   Activity as tolerated.   Follow up with Dr. Jude  or Gloria Ricardo in 3 months and As needed

## 2024-06-23 NOTE — Progress Notes (Signed)
 Pre/post spiro performed today

## 2024-06-23 NOTE — Patient Instructions (Signed)
 Pre/post spiro performed today

## 2024-06-23 NOTE — Progress Notes (Signed)
 @Patient  ID: Jane English, female    DOB: 1970-12-31, 53 y.o.   MRN: [id number]  Chief Complaint  Patient presents with   Asthma    Referring provider: Antonetta Rollene BRAVO, MD  HPI: 53 yo female never smoker seen for pulmonary consult 03/22/24 for Asthma and Chronic rhinitis  CNA  TEST/EVENTS :  12/2023 AEC 500 05/2024 immunoglobulins okay    Spiro 07/2020 mod restriction , ratio 89, FEV1 64%,FVC 58 % 04/2020 spiro >> mod restriction   CTA 12/2023 neg, lungs clear  CT sinus 01/2024 >> BL max, ethmoid sinusisits  06/23/2024 Follow up : Asthma /Chronic rhinitis and  Discussed the use of AI scribe software for clinical note transcription with the patient, who gave verbal consent to proceed.  History of Present Illness Jane English is a 53 year old female with asthma and chronic rhinitis who presents for a 3 month follow up   She experiences intermittent asthma symptoms, described as cough, congestion and tightness. Currently uses Breo once daily and albuterol  as needed, approximately twice a day. She previously found montelukast  helpful but is not currently taking it. She experiences congestion and coughs up mucus at times. Claritin  causes dryness without significant relief.  Her allergy  panel revealed strong allergies to dust, cats, and dogs. She owns two dogs, a pocket bull dog and a shih tzu, but no cats, and ensures her dogs do not sleep in her bedroom.  She has a history of elevated eosinophil count, with a previous CBC showing an absolute eosinophil count of 500. She has been seen by an ENT and allergist in the past. CT sinus March 2025 showed paranasal sinus disease. . She has a history of sinus congestion and recurrent infections, with a particularly bad sinus infection in March that took a long time to resolve with antibiotics and prednisone . She recently had a root canal and was advised to follow up with her dentist regarding potential dental issues contributing to her sinus  problems.  Today spirometry showed moderate restriction, with FEV1 at 62%, ratio 78 and FVC 62%. Mid flow obstruction with BD response . Feb  CT scan showed clear lungs.     Allergies  Allergen Reactions   Neurontin [Gabapentin] Nausea And Vomiting and Other (See Comments)    Sleep walking/hallucinations    Penicillins Hives, Shortness Of Breath and Other (See Comments)    Has patient had a PCN reaction causing immediate rash, facial/tongue/throat swelling, SOB or lightheadedness with hypotension: Yes Has patient had a PCN reaction causing severe rash involving mucus membranes or skin necrosis: No Has patient had a PCN reaction that required hospitalization: No Has patient had a PCN reaction occurring within the last 10 years: Yes If all of the above answers are NO, then may proceed with Cephalosporin use.   Pregabalin Anaphylaxis, Shortness Of Breath, Diarrhea and Swelling    Lyrica   Tramadol  Other (See Comments)    Makes patient delusional   Latex Rash    Immunization History  Administered Date(s) Administered   Influenza Split 09/03/2012   Influenza Whole 07/28/2007, 08/11/2009, 07/19/2010   Influenza, Seasonal, Injecte, Preservative Fre 07/25/2023, 09/02/2023   Influenza,inj,Quad PF,6+ Mos 09/13/2013, 08/12/2014, 08/01/2015, 07/24/2016, 08/14/2017, 08/12/2018, 08/10/2019, 07/11/2020, 06/20/2022, 08/29/2022   Influenza-Unspecified 05/24/2018, 08/29/2022   Moderna Covid-19 Fall Seasonal Vaccine 33yrs & older 09/02/2023   Moderna SARS-COV2 Booster Vaccination 08/29/2022   Moderna Sars-Covid-2 Vaccination 07/21/2020, 08/14/2020, 03/12/2021   PNEUMOCOCCAL CONJUGATE-20 03/27/2022   PPD Test 02/22/2011, 06/26/2011, 09/18/2012, 11/29/2013, 12/06/2014,  12/09/2017, 01/15/2022, 02/01/2022   Pneumococcal Polysaccharide-23 05/14/2021   Td 10/03/2005   Tdap 12/09/2017, 09/07/2022   Zoster Recombinant(Shingrix) 06/27/2021, 03/27/2022    Past Medical History:  Diagnosis Date    Anemia    Asthma    Asthma flare 04/09/2013   Back pain    Bronchitis    Chronic abdominal pain    Chronic constipation    Constipation due to opioid therapy    Depression    Depression, major, single episode, severe (HCC) 10/03/2018   PHQ 9 score of 15   Diabetes mellitus without complication (HCC)    Diabetes mellitus, type II (HCC)    DVT (deep venous thrombosis) (HCC) 2010   GERD (gastroesophageal reflux disease)    Heart murmur    no cardiologist   Helicobacter pylori gastritis 06/11/2013   Colonoscopy Dr. Albertus   Hypertension    IBS (irritable bowel syndrome)    Migraine headache    Neuropathy    Obesity    Obsessive-compulsive disorder    PSYCHOTIC D/O W/HALLUCINATIONS CONDS CLASS ELSW 03/04/2010   Qualifier: Diagnosis of  By: Antonetta MD, Margaret     PTSD (post-traumatic stress disorder)    SBO (small bowel obstruction) (HCC) 08/09/2013   Seasonal allergies 12/10/2012   Seizures (HCC)    Shortness of breath     Tobacco History: Social History   Tobacco Use  Smoking Status Never   Passive exposure: Never  Smokeless Tobacco Never   Counseling given: Not Answered   Outpatient Medications Prior to Visit  Medication Sig Dispense Refill   ACCU-CHEK GUIDE test strip USE AS INSTRUCTED ONCE DAILY DX E11.9 100 strip 5   albuterol  (PROVENTIL ) (2.5 MG/3ML) 0.083% nebulizer solution Take 3 mLs (2.5 mg total) by nebulization every 6 (six) hours as needed for wheezing or shortness of breath. 150 mL 1   albuterol  (VENTOLIN  HFA) 108 (90 Base) MCG/ACT inhaler TAKE 2 PUFFS BY MOUTH EVERY 6 HOURS AS NEEDED FOR WHEEZE OR SHORTNESS OF BREATH 18 each 4   amitriptyline  (ELAVIL ) 10 MG tablet TAKE 1 TABLET BY MOUTH EVERYDAY AT BEDTIME 90 tablet 2   Azelastine -Fluticasone  (DYMISTA ) 137-50 MCG/ACT SUSP Place 2 sprays into both nostrils 1 day or 1 dose. 23 g 5   benzoyl peroxide  5 % external liquid Apply topically 2 (two) times daily. 142 g 12   blood glucose meter kit and supplies Dispense  based on patient and insurance preference. Once daily testing DX E11.9 1 each 0   cetirizine  (ZYRTEC ) 10 MG tablet Take 1 tablet (10 mg total) by mouth daily. 90 tablet 1   cloNIDine  (CATAPRES ) 0.1 MG tablet TAKE 1 TABLET BY MOUTH EVERYDAY AT BEDTIME 90 tablet 1   cromolyn  (OPTICROM ) 4 % ophthalmic solution Place 1 drop into both eyes 4 (four) times daily. 10 mL 12   diclofenac  Sodium (VOLTAREN ) 1 % GEL APPLY 2 GRAMS TO AFFECTED AREA 4 TIMES A DAY 100 g 4   EPINEPHrine  0.3 mg/0.3 mL IJ SOAJ injection as needed.     FLUoxetine  (PROZAC ) 10 MG capsule Take 1 capsule (10 mg total) by mouth daily.     fluticasone  (FLONASE ) 50 MCG/ACT nasal spray Place 1-2 sprays into both nostrils daily. 48 mL 1   hydrOXYzine  (ATARAX ) 25 MG tablet Take 1 tablet (25 mg total) by mouth 3 (three) times daily as needed. 90 tablet 0   ipratropium (ATROVENT ) 0.03 % nasal spray Place 2 sprays into both nostrils every 12 (twelve) hours. 30 mL 0  lidocaine  (LIDODERM ) 5 % Place 1 patch onto the skin daily. Remove & Discard patch within 12 hours or as directed by MD 30 patch 0   lidocaine  (XYLOCAINE ) 2 % jelly      linaclotide  (LINZESS ) 290 MCG CAPS capsule TAKE 1 CAPSULE (290 MCG TOTAL) BY MOUTH DAILY BEFORE BREAKFAST. 90 capsule 0   loratadine  (CLARITIN ) 10 MG tablet TAKE 1 TABLET BY MOUTH EVERY DAY 90 tablet 1   lubiprostone  (AMITIZA ) 24 MCG capsule TAKE 1 CAPSULE BY MOUTH TWICE A DAY WITH MEALS 180 capsule 0   magnesium  oxide (MAG-OX) 400 MG tablet Take 1 tablet (400 mg total) by mouth 2 (two) times daily. 90 tablet 3   meloxicam  (MOBIC ) 7.5 MG tablet TAKE 1 TABLET BY MOUTH 2 TIMES DAILY AS NEEDED FOR PAIN. 60 tablet 11   Na Sulfate-K Sulfate-Mg Sulf 17.5-3.13-1.6 GM/177ML SOLN Take as directed 354 mL 0   NARCAN  4 MG/0.1ML LIQD nasal spray kit Place 4 mg into the nose as directed.  1   NONFORMULARY OR COMPOUNDED ITEM Apply 1-2 pumps, 3-4 times daily as needed. 1 each 0   olmesartan -hydrochlorothiazide  (BENICAR  HCT) 20-12.5  MG tablet TAKE 1 TABLET BY MOUTH EVERY DAY 90 tablet 1   ondansetron  (ZOFRAN ) 4 MG tablet Take 1 tablet (4 mg total) by mouth every 8 (eight) hours as needed for nausea or vomiting. 20 tablet 0   oxybutynin  (DITROPAN -XL) 10 MG 24 hr tablet TAKE 1 TABLET BY MOUTH EVERYDAY AT BEDTIME 90 tablet 2   oxyCODONE  (ROXICODONE ) 5 MG immediate release tablet Take 2 tablets (10 mg total) by mouth 2 (two) times daily. Do Not Fill Before 04/08/2024 120 tablet 0   pantoprazole  (PROTONIX ) 40 MG tablet Take 1 tablet (40 mg total) by mouth daily. 90 tablet 1   potassium chloride  20 MEQ/15ML (10%) SOLN Take 15 mLs (20 mEq total) by mouth 2 (two) times daily. 473 mL 1   rosuvastatin  (CRESTOR ) 5 MG tablet TAKE 1 TABLET (5 MG TOTAL) BY MOUTH DAILY. 90 tablet 1   Safety Seal Miscellaneous MISC Apply 1 Application topically in the morning. MEDICATION NAME: AA gel (Minoxidil 10%, Clobetasol 0.05%, Finasteride 0.05%) 30 g 8   solifenacin  (VESICARE ) 5 MG tablet TAKE 1 TABLET (5 MG TOTAL) BY MOUTH DAILY. 90 tablet 1   terbinafine  (LAMISIL ) 250 MG tablet Take 1 tablet (250 mg total) by mouth daily. 42 tablet 0   tirzepatide  (MOUNJARO ) 5 MG/0.5ML Pen Inject 5 mg into the skin once a week. 2 mL 5   topiramate  (TOPAMAX ) 25 MG tablet TAKE 1 TABLET BY MOUTH TWICE A DAY 180 tablet 3   trolamine salicylate (ASPERCREME) 10 % cream Apply 1 application topically as needed for muscle pain.      beclomethasone (QVAR  REDIHALER) 40 MCG/ACT inhaler Inhale 2 puffs into the lungs 2 (two) times daily. 10.6 g 5   BREO ELLIPTA  100-25 MCG/ACT AEPB INHALE 1 PUFF INTO THE LUNGS TWICE A DAY 60 each 1   montelukast  (SINGULAIR ) 10 MG tablet TAKE 1 TABLET BY MOUTH EVERYDAY AT BEDTIME 90 tablet 0   No facility-administered medications prior to visit.     Review of Systems:   Constitutional:   No  weight loss, night sweats,  Fevers, chills, fatigue, or  lassitude.  HEENT:   No headaches,  Difficulty swallowing,  Tooth/dental problems, or  Sore  throat,                No sneezing, itching, ear ache,+ nasal congestion, post  nasal drip,   CV:  No chest pain,  Orthopnea, PND, swelling in lower extremities, anasarca, dizziness, palpitations, syncope.   GI  No heartburn, indigestion, abdominal pain, nausea, vomiting, diarrhea, change in bowel habits, loss of appetite, bloody stools.   Resp:   No wheezing.  No chest wall deformity  Skin: no rash or lesions.  GU: no dysuria, change in color of urine, no urgency or frequency.  No flank pain, no hematuria   MS:  No joint pain or swelling.  No decreased range of motion.  No back pain.    Physical Exam  BP (!) 106/48 (BP Location: Left Arm, Patient Position: Sitting, Cuff Size: Large)   Pulse 95   Temp 98.4 F (36.9 C) (Oral)   Ht 5' 6.2 (1.681 m)   Wt 170 lb 6.4 oz (77.3 kg)   SpO2 100% Comment: RA  BMI 27.34 kg/m   GEN: A/Ox3; pleasant , NAD, well nourished    HEENT:  [place]/AT,   NOSE-clear, THROAT-clear, no lesions, no postnasal drip or exudate noted.   NECK:  Supple w/ fair ROM; no JVD; normal carotid impulses w/o bruits; no thyromegaly or nodules palpated; no lymphadenopathy.    RESP  Clear  P & A; w/o, wheezes/ rales/ or rhonchi. no accessory muscle use, no dullness to percussion  CARD:  RRR, no m/r/g, no peripheral edema, pulses intact, no cyanosis or clubbing.  GI:   Soft & nt; nml bowel sounds; no organomegaly or masses detected.   Musco: Warm bil, no deformities or joint swelling noted.   Neuro: alert, no focal deficits noted.    Skin: Warm, no lesions or rashes    Lab Results:  CBC    Component Value Date/Time   WBC 5.3 05/20/2024 1502   WBC 6.2 12/22/2023 1506   RBC 4.29 05/20/2024 1502   RBC 3.71 (L) 12/22/2023 1506   HGB 13.3 05/20/2024 1502   HCT 39.4 05/20/2024 1502   PLT 285 05/20/2024 1502   MCV 92 05/20/2024 1502   MCH 31.0 05/20/2024 1502   MCH 32.1 12/22/2023 1506   MCHC 33.8 05/20/2024 1502   MCHC 34.1 12/22/2023 1506   RDW 12.4  05/20/2024 1502   LYMPHSABS 1.9 05/20/2024 1502   MONOABS 0.6 06/05/2022 2045   EOSABS 0.6 (H) 05/20/2024 1502   BASOSABS 0.1 05/20/2024 1502    BMET   BNP No results found for: BNP  ProBNP No results found for: PROBNP  Imaging: No results found.  Administration History     None          Latest Ref Rng & Units 06/23/2024    9:36 AM  PFT Results  FVC-Pre L 2.30  P  FVC-Predicted Pre % 61  P  FVC-Post L 2.32  P  FVC-Predicted Post % 62  P  Pre FEV1/FVC % % 77  P  Post FEV1/FCV % % 78  P  FEV1-Pre L 1.76  P  FEV1-Predicted Pre % 60  P  FEV1-Post L 1.82  P    P Preliminary result    No results found for: NITRICOXIDE      Assessment & Plan:   No problem-specific Assessment & Plan notes found for this encounter.  Assessment and Plan Assessment & Plan Asthma  with allergic phenotype PFTs reviewed in detail  Asthma presents with moderate restriction and mid flow obstruction/BD response. on pulmonary function testing, accompanied by coughing, wheezing, and congestion. An elevated eosinophil count and allergies to dust, dogs, and trees  suggest an allergic component. A previous CT scan showed clear lungs, Current treatment includes Breo and albuterol  as needed. Transition to Trelegy for stronger control due to its additional bronchodilator that opens airways more effectively than Breo.  Discontinue Breo and initiate Trelegy one puff daily. Restart Singulair  10 mg daily. Continue albuterol  inhaler or nebulizer as needed. Add Zyrtec  and Flonase  . Consider biologic injectables if symptoms persist after three months.  Chronic Rhinitis/Sinusitis   Chronic sinusitis with a severe sinus infection in March. Completed course of antibiotics. . Sinus congestion may exacerbate asthma symptoms. Use saline nasal rinse twice daily and follow up with a dentist to address potential dental contributions to sinusitis. Add Singulair , Flonase , Zyrtec  . Saline nasal rinses   Recent  root canal /dental issues.-follow up with dentist as planned.   Plan  Patient Instructions  Follow up with Dentist.   Begin Saline nasals spray/rinse Twice daily   Begin Flonase  1 puff daily  Begin Zyrtec  5mg  At bedtime  -(low dose)  Begin Singulair  10mg  At bedtime   Stop Breo  Begin Trelegy 1 puff daily  Albuterol  inhaler or neb As needed   Activity as tolerated.   Follow up with Dr. Jude  or Treshaun Carrico in 3 months and As needed        Follow-up   Plan to assess the effectiveness of the new treatment regimen and adjust as necessary. The goal is to manage asthma and rhinitis symptoms effectively, reducing the need for albuterol  and improving quality of life. If symptoms persist, consider adding biologic injectables. Follow up in three months with either the current provider or Dr. Jude.  I spent  42 minutes dedicated to the care of this patient on the date of this encounter to include pre-visit review of records, face-to-face time with the patient discussing conditions above, post visit ordering of testing, clinical documentation with the electronic health record, making appropriate referrals as documented, and communicating necessary findings to members of the patients care team.     Madelin Stank, NP 06/23/2024

## 2024-06-24 ENCOUNTER — Ambulatory Visit (INDEPENDENT_AMBULATORY_CARE_PROVIDER_SITE_OTHER): Admitting: Psychiatry

## 2024-06-24 DIAGNOSIS — F331 Major depressive disorder, recurrent, moderate: Secondary | ICD-10-CM

## 2024-06-24 DIAGNOSIS — F411 Generalized anxiety disorder: Secondary | ICD-10-CM | POA: Diagnosis not present
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
## 2024-06-28 ENCOUNTER — Encounter (HOSPITAL_COMMUNITY): Payer: Self-pay | Admitting: Registered Nurse

## 2024-06-28 ENCOUNTER — Encounter: Attending: Physical Medicine and Rehabilitation | Admitting: Physical Medicine and Rehabilitation

## 2024-06-28 ENCOUNTER — Other Ambulatory Visit: Payer: Self-pay | Admitting: Physical Medicine and Rehabilitation

## 2024-06-28 ENCOUNTER — Encounter: Payer: Self-pay | Admitting: Physical Medicine and Rehabilitation

## 2024-06-28 ENCOUNTER — Ambulatory Visit (INDEPENDENT_AMBULATORY_CARE_PROVIDER_SITE_OTHER): Admitting: Registered Nurse

## 2024-06-28 VITALS — BP 113/74 | HR 98 | Ht 66.2 in | Wt 171.8 lb

## 2024-06-28 DIAGNOSIS — M48062 Spinal stenosis, lumbar region with neurogenic claudication: Secondary | ICD-10-CM | POA: Diagnosis not present

## 2024-06-28 DIAGNOSIS — F419 Anxiety disorder, unspecified: Secondary | ICD-10-CM | POA: Diagnosis present

## 2024-06-28 DIAGNOSIS — M79642 Pain in left hand: Secondary | ICD-10-CM | POA: Insufficient documentation

## 2024-06-28 DIAGNOSIS — F331 Major depressive disorder, recurrent, moderate: Secondary | ICD-10-CM

## 2024-06-28 DIAGNOSIS — F32A Depression, unspecified: Secondary | ICD-10-CM | POA: Diagnosis present

## 2024-06-28 DIAGNOSIS — Z87828 Personal history of other (healed) physical injury and trauma: Secondary | ICD-10-CM | POA: Diagnosis not present

## 2024-06-28 DIAGNOSIS — G894 Chronic pain syndrome: Secondary | ICD-10-CM

## 2024-06-28 DIAGNOSIS — M79641 Pain in right hand: Secondary | ICD-10-CM | POA: Insufficient documentation

## 2024-06-28 DIAGNOSIS — F411 Generalized anxiety disorder: Secondary | ICD-10-CM

## 2024-06-28 DIAGNOSIS — G8929 Other chronic pain: Secondary | ICD-10-CM

## 2024-06-28 DIAGNOSIS — M961 Postlaminectomy syndrome, not elsewhere classified: Secondary | ICD-10-CM

## 2024-06-28 MED ORDER — OXYCODONE HCL 5 MG PO TABS: 10.0000 mg | ORAL_TABLET | Freq: Two times a day (BID) | ORAL | 0 refills | Status: DC

## 2024-06-28 MED ORDER — FLUOXETINE HCL 20 MG PO CAPS
20.0000 mg | ORAL_CAPSULE | Freq: Every day | ORAL | 0 refills | Status: DC
Start: 2024-06-28 — End: 2024-06-30

## 2024-06-28 MED ORDER — HYDROXYZINE HCL 25 MG PO TABS
25.0000 mg | ORAL_TABLET | Freq: Three times a day (TID) | ORAL | Status: DC | PRN
Start: 2024-06-28 — End: 2024-07-19

## 2024-06-28 NOTE — Patient Instructions (Signed)
 Foods that may reduce pain: 1) Ginger (especially studied for arthritis)- reduce leukotriene production to decrease inflammation 2) Blueberries- high in phytonutrients that decrease inflammation 3) Salmon- marine omega-3s reduce joint swelling and pain 4) Pumpkin seeds- reduce inflammation 5) dark chocolate- reduces inflammation 6) turmeric- reduces inflammation 7) tart cherries - reduce pain and stiffness 8) extra virgin olive oil - its compound olecanthal helps to block prostaglandins  9) chili peppers- can be eaten or applied topically via capsaicin 10) mint- helpful for headache, muscle aches, joint pain, and itching 11) garlic- reduces inflammation 12) Green tea- reduces inflammation and oxidative stress, helps with weight loss, may reduce the risk of cancer, recommend Double Green Matcha [place] of Tea daily  Link to further information on diet for chronic pain: [email or web address]   Saffron for depression
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
## 2024-06-28 NOTE — Patient Instructions (Signed)
 Call 911, [phone], mobile crisis, or present to the nearest emergency room should you experience any suicidal/homicidal ideation, auditory/visual/hallucinations, or detrimental worsening of your mental health.  Mobile Crisis Response Teams Listed by counties in vicinity of [facility] providers [facility] [employer] [phone] [facility] [phone] [facility] [phone] [facility] [phone] [place]                * [facility] [phone]                * [facility] [phone]  [facility] [phone] [facility] [employer]  [phone] * [employer] [phone]
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
## 2024-06-29 ENCOUNTER — Other Ambulatory Visit: Payer: Self-pay | Admitting: Family Medicine

## 2024-06-30 ENCOUNTER — Encounter: Payer: Self-pay | Admitting: Adult Health

## 2024-06-30 ENCOUNTER — Ambulatory Visit (INDEPENDENT_AMBULATORY_CARE_PROVIDER_SITE_OTHER): Payer: 59 | Admitting: Adult Health

## 2024-06-30 VITALS — BP 123/80 | HR 82 | Ht 66.0 in | Wt 172.5 lb

## 2024-06-30 DIAGNOSIS — R599 Enlarged lymph nodes, unspecified: Secondary | ICD-10-CM

## 2024-06-30 DIAGNOSIS — Z1331 Encounter for screening for depression: Secondary | ICD-10-CM

## 2024-06-30 DIAGNOSIS — N644 Mastodynia: Secondary | ICD-10-CM | POA: Diagnosis not present

## 2024-06-30 NOTE — Progress Notes (Signed)
 Subjective:     Patient ID: Jane English, female   DOB: 1971-09-23, 53 y.o.   MRN: [id number]  HPI Jane English is a 53 year old black female,separated, PM in complaining of burning and tenderness in right breast for about 2 weeks, no known injury. She had normal mammogram 04/09/24.     Component Value Date/Time   DIAGPAP (A) 07/01/2023 0913    - Atypical squamous cells of undetermined significance (ASC-US )   DIAGPAP  03/21/2022 1155    - Negative for intraepithelial lesion or malignancy (NILM)   DIAGPAP  08/10/2020 1312    - Negative for intraepithelial lesion or malignancy (NILM)   HPVHIGH Negative 07/01/2023 0913   HPVHIGH Positive (A) 03/21/2022 1155   HPVHIGH Negative 08/10/2020 1312   ADEQPAP  07/01/2023 0913    Satisfactory for evaluation. The presence or absence of an   ADEQPAP  07/01/2023 0913    endocervical/transformation zone component cannot be determined because   ADEQPAP of atrophy. 07/01/2023 0913   PCP is Dr Antonetta  Review of Systems +burning and tenderness in right breast for about 2 weeks, no known injury. She had normal mammogram 04/09/24.   Reviewed past medical,surgical, social and family history. Reviewed medications and allergies.  Objective:   Physical Exam BP 123/80 (BP Location: Left Arm, Patient Position: Sitting, Cuff Size: Normal)   Pulse 82   Ht 5' 6 (1.676 m)   Wt 172 lb 8 oz (78.2 kg)   LMP 01/14/2019 Comment: tubal ligation  BMI 27.84 kg/m      Skin warm and dry,  Breasts:no dominate palpable mass, retraction or nipple discharge on the left, on the right, no retraction or nipple discharge, breast is tender UOQ and has lymph node in axilla at about 11 0' clock 6 FB from areola, tender and ?enlarged. She had pierced nipples, but does not wear rods. AA is 0 Fall risk is low    06/30/2024    8:50 AM 06/28/2024    4:34 PM 06/28/2024    8:57 AM  Depression screen PHQ 2/9  Decreased Interest 2  0  Down, Depressed, Hopeless 2  0  PHQ - 2 Score 4  0   Altered sleeping 1  0  Tired, decreased energy 1  0  Change in appetite 1  0  Feeling bad or failure about yourself  1  0  Trouble concentrating 0  0  Moving slowly or fidgety/restless 0  0  Suicidal thoughts 0  0  PHQ-9 Score 8  0  Difficult doing work/chores   Somewhat difficult     Information is confidential and restricted. Go to Review Flowsheets to unlock data.   On meds     06/30/2024    8:50 AM 06/28/2024    4:35 PM 05/31/2024    1:11 PM 04/12/2024    2:19 PM  GAD 7 : Generalized Anxiety Score  Nervous, Anxious, on Edge 2     Control/stop worrying 2     Worry too much - different things 2     Trouble relaxing 1     Restless 1     Easily annoyed or irritable 1     Afraid - awful might happen 0     Total GAD 7 Score 9     Anxiety Difficulty         Information is confidential and restricted. Go to Review Flowsheets to unlock data.    Upstream - 06/30/24 9152  Pregnancy Intention Screening   Does the patient want to become pregnant in the next year? N/A    Does the patient's partner want to become pregnant in the next year? N/A    Would the patient like to discuss contraceptive options today? N/A      Contraception Wrap Up   Current Method Female Sterilization   PM   End Method Female Sterilization   PM   Contraception Counseling Provided No            Assessment:     1. Breast tenderness (Primary) burning and tenderness in right breast for about 2 weeks, no known injury. She had normal mammogram 04/09/24. Right diagnostic mammogram and US  scheduled for 07/13/24 at 2:40 pm at [facility] - [place]  LIMITED ULTRASOUND INCLUDING AXILLA RIGHT BREAST; Future - MM 3D DIAGNOSTIC MAMMOGRAM UNILATERAL RIGHT BREAST; Future Can try heat and ice Decrease caffeine   2. Lymph node enlargement +right axilla lymph node tender and ?enlarged  Right diagnostic mammogram and US  scheduled for 07/13/24 at 2:40 pm at [facility] - [place]  LIMITED ULTRASOUND INCLUDING AXILLA RIGHT BREAST;  Future - MM 3D DIAGNOSTIC MAMMOGRAM UNILATERAL RIGHT BREAST; Future     Plan:     Return 07/19/24 for physical

## 2024-07-01 ENCOUNTER — Encounter: Payer: Self-pay | Admitting: Family Medicine

## 2024-07-01 ENCOUNTER — Ambulatory Visit (INDEPENDENT_AMBULATORY_CARE_PROVIDER_SITE_OTHER): Payer: Self-pay | Admitting: Family Medicine

## 2024-07-01 VITALS — BP 103/68 | HR 94 | Resp 16 | Ht 66.0 in | Wt 171.0 lb

## 2024-07-01 DIAGNOSIS — F419 Anxiety disorder, unspecified: Secondary | ICD-10-CM | POA: Diagnosis not present

## 2024-07-01 DIAGNOSIS — Z23 Encounter for immunization: Secondary | ICD-10-CM

## 2024-07-01 DIAGNOSIS — E785 Hyperlipidemia, unspecified: Secondary | ICD-10-CM

## 2024-07-01 DIAGNOSIS — I10 Essential (primary) hypertension: Secondary | ICD-10-CM | POA: Diagnosis not present

## 2024-07-01 DIAGNOSIS — E1169 Type 2 diabetes mellitus with other specified complication: Secondary | ICD-10-CM | POA: Diagnosis not present

## 2024-07-01 DIAGNOSIS — F322 Major depressive disorder, single episode, severe without psychotic features: Secondary | ICD-10-CM | POA: Diagnosis not present

## 2024-07-01 DIAGNOSIS — E663 Overweight: Secondary | ICD-10-CM

## 2024-07-01 NOTE — Patient Instructions (Addendum)
 F/U In January  Need Covuid vaccine  Hep B#1 today  and arrange NV at 2 and 6 months  HBA1C today , bmp  and EGFr   You need to keep f/u appointment with ENT   It is important that you exercise regularly at least 30 minutes 5 times a week. If you develop chest pain, have severe difficulty breathing, or feel very tired, stop exercising immediately and seek medical attention   Thanks for choosing [facility], we consider it a privelige to serve you.

## 2024-07-02 LAB — BMP8+EGFR
BUN/Creatinine Ratio: 12 (ref 9–23)
BUN: 12 mg/dL (ref 6–24)
CO2: 25 mmol/L (ref 20–29)
Calcium: 9.5 mg/dL (ref 8.7–10.2)
Chloride: 100 mmol/L (ref 96–106)
Creatinine, Ser: 1 mg/dL (ref 0.57–1.00)
Glucose: 102 mg/dL — ABNORMAL HIGH (ref 70–99)
Potassium: 3.8 mmol/L (ref 3.5–5.2)
Sodium: 142 mmol/L (ref 134–144)
eGFR: 67 mL/min/1.73 (ref >59)

## 2024-07-02 LAB — HEMOGLOBIN A1C
Est. average glucose Bld gHb Est-mCnc: 126 mg/dL
Hgb A1c MFr Bld: 6 % — ABNORMAL HIGH (ref 4.8–5.6)

## 2024-07-03 ENCOUNTER — Other Ambulatory Visit: Payer: Self-pay | Admitting: Family Medicine

## 2024-07-04 ENCOUNTER — Encounter: Payer: Self-pay | Admitting: Family Medicine

## 2024-07-04 NOTE — Progress Notes (Signed)
 Jane English     MRN: [id number]      DOB: September 29, 1971  Chief Complaint  Patient presents with   Medical Management of Chronic Issues    5 month follow up     HPI Jane English is here for follow up and re-evaluation of chronic medical conditions, medication management and review of any available recent lab and radiology data.  Preventive health is updated, specifically  Cancer screening and Immunization.   Questions or concerns regarding consultations or procedures which the PT has had in the interim are  addressed. Needs to f/u with ENT  The PT denies any adverse reactions to current medications since the last visit.  C/o severe uncontrolled anxiety and feels unsafe on her own, has no axccess to guns in her home has no suicidal or homicidal thoughts or intent ROS Denies recent fever or chills. Denies sinus pressure, nasal congestion, ear pain or sore throat. Denies chest congestion, productive cough or wheezing. Denies chest pains, palpitations and leg swelling Denies abdominal pain, nausea, vomiting,diarrhea or constipation.   Denies dysuria, frequency, hesitancy or incontinence.  Chronic  joint pain,  and limitation in mobility. Denies headaches, seizures, numbness, or tingling. Chronic severe  depression, and anxiety . Denies skin break down or rash.   PE  BP 103/68   Pulse 94   Resp 16   Ht 5' 6 (1.676 m)   Wt 171 lb (77.6 kg)   LMP 01/14/2019 Comment: tubal ligation  SpO2 95%   BMI 27.60 kg/m   Patient alert and oriented and in no cardiopulmonary distress.  HEENT: No facial asymmetry, EOMI,     Neck supple .  Chest: Clear to auscultation bilaterally.  CVS: S1, S2 no murmurs, no S3.Regular rate.  ABD: Soft non tender.   Ext: No edema  MS: Adequate though decreased  ROM spine,  adequate in shoulders, hips and knees.  Skin: Intact, no ulcerations or rash noted.  Psych: Good eye contact, normal affect. Memory intact not anxious or depressed  appearing.  CNS: CN 2-12 intact, power,  normal throughout.no focal deficits noted.   Assessment & Plan  Anxiety Managed byu Psych with close f/u with both Therapist has treatment every 2 weeks  Depression, major, single episode, severe (HCC) Psych manages and doing fairly well  Essential hypertension Controlled, no change in medication DASH diet and commitment to daily physical activity for a minimum of 30 minutes discussed and encouraged, as a part of hypertension management. The importance of attaining a healthy weight is also discussed.     07/01/2024   10:45 AM 06/30/2024    8:41 AM 06/28/2024    4:33 PM 06/28/2024    9:00 AM 06/23/2024   11:32 AM 06/14/2024    2:00 PM 05/31/2024    1:04 PM  BP/Weight  Systolic BP 103 123  113 106    Diastolic BP 68 80  74 48    Wt. (Lbs) 171 172.5  171.8 170.4    BMI 27.6 kg/m2 27.84 kg/m2  27.56 kg/m2 27.34 kg/m2       Information is confidential and restricted. Go to Review Flowsheets to unlock data.       Type 2 diabetes mellitus with other specified complication (HCC) Diabetes associated with hypertension, hyperlipidemia, and depression  Jane English is reminded of the importance of commitment to daily physical activity for 30 minutes or more, as able and the need to limit carbohydrate intake to 30 to 60 grams per meal to help  with blood sugar control.   The need to take medication as prescribed, test blood sugar as directed, and to call between visits if there is a concern that blood sugar is uncontrolled is also discussed.   Jane English is reminded of the importance of daily foot exam, annual eye examination, and good blood sugar, blood pressure and cholesterol control.     Latest Ref Rng & Units 07/01/2024   11:38 AM 03/25/2024   10:44 AM 12/22/2023    3:06 PM 12/17/2023   10:36 AM 07/01/2023    9:28 AM  Diabetic Labs  HbA1c 4.8 - 5.6 % 6.0    5.9  6.0   Micro/Creat Ratio 0 - 29 mg/g creat    5    Chol 100 - 199 mg/dL  858     795   HDL >60 mg/dL  63    66   Calc LDL 0 - 99 mg/dL  64    94   Triglycerides 0 - 149 mg/dL  69    731   Creatinine 0.57 - 1.00 mg/dL 8.99  9.16  9.21  9.03  0.69       07/01/2024   10:45 AM 06/30/2024    8:41 AM 06/28/2024    4:33 PM 06/28/2024    9:00 AM 06/23/2024   11:32 AM 06/14/2024    2:00 PM 05/31/2024    1:04 PM  BP/Weight  Systolic BP 103 123  113 106    Diastolic BP 68 80  74 48    Wt. (Lbs) 171 172.5  171.8 170.4    BMI 27.6 kg/m2 27.84 kg/m2  27.56 kg/m2 27.34 kg/m2       Information is confidential and restricted. Go to Review Flowsheets to unlock data.      Latest Ref Rng & Units 12/17/2023    9:40 AM 07/10/2023   12:00 AM  Foot/eye exam completion dates  Eye Exam No Retinopathy  No Retinopathy      Foot Form Completion  Done      This result is from an external source.        Overweight (BMI 25.0-29.9)  Patient re-educated about  the importance of commitment to a  minimum of 150 minutes of exercise per week as able.  The importance of healthy food choices with portion control discussed, as well as eating regularly and within a 12 hour window most days. The need to choose clean , green food 50 to 75% of the time is discussed, as well as to make water the primary drink and set a goal of 64 ounces water daily.       07/01/2024   10:45 AM 06/30/2024    8:41 AM 06/28/2024    4:33 PM  Weight /BMI  Weight 171 lb 172 lb 8 oz   Height 5' 6 (1.676 m) 5' 6 (1.676 m)   BMI 27.6 kg/m2 27.84 kg/m2      Information is confidential and restricted. Go to Review Flowsheets to unlock data.      Hyperlipidemia LDL goal <100 Hyperlipidemia:Low fat diet discussed and encouraged.   Lipid Panel  Lab Results  Component Value Date   CHOL 141 03/25/2024   HDL 63 03/25/2024   LDLCALC 64 03/25/2024   TRIG 69 03/25/2024   CHOLHDL 3.1 07/01/2023

## 2024-07-08 ENCOUNTER — Ambulatory Visit (HOSPITAL_COMMUNITY): Admitting: Psychiatry

## 2024-07-13 ENCOUNTER — Ambulatory Visit (HOSPITAL_COMMUNITY)
Admission: RE | Admit: 2024-07-13 | Discharge: 2024-07-13 | Disposition: A | Discharge status: Home / Self Care | Source: Ambulatory Visit | Attending: Adult Health | Admitting: Adult Health

## 2024-07-13 ENCOUNTER — Other Ambulatory Visit: Payer: Self-pay | Admitting: Family Medicine

## 2024-07-13 DIAGNOSIS — L659 Nonscarring hair loss, unspecified: Secondary | ICD-10-CM

## 2024-07-13 DIAGNOSIS — R92321 Mammographic fibroglandular density, right breast: Secondary | ICD-10-CM | POA: Diagnosis not present

## 2024-07-13 DIAGNOSIS — R599 Enlarged lymph nodes, unspecified: Secondary | ICD-10-CM | POA: Diagnosis not present

## 2024-07-13 DIAGNOSIS — N644 Mastodynia: Secondary | ICD-10-CM

## 2024-07-13 DIAGNOSIS — R590 Localized enlarged lymph nodes: Secondary | ICD-10-CM | POA: Diagnosis not present

## 2024-07-14 ENCOUNTER — Ambulatory Visit: Payer: Self-pay | Admitting: Adult Health

## 2024-07-15 DIAGNOSIS — Z23 Encounter for immunization: Secondary | ICD-10-CM | POA: Insufficient documentation

## 2024-07-15 NOTE — Assessment & Plan Note (Signed)
 Controlled, no change in medication DASH diet and commitment to daily physical activity for a minimum of 30 minutes discussed and encouraged, as a part of hypertension management. The importance of attaining a healthy weight is also discussed.     07/01/2024   10:45 AM 06/30/2024    8:41 AM 06/28/2024    4:33 PM 06/28/2024    9:00 AM 06/23/2024   11:32 AM 06/14/2024    2:00 PM 05/31/2024    1:04 PM  BP/Weight  Systolic BP 103 123  113 106    Diastolic BP 68 80  74 48    Wt. (Lbs) 171 172.5  171.8 170.4    BMI 27.6 kg/m2 27.84 kg/m2  27.56 kg/m2 27.34 kg/m2       Information is confidential and restricted. Go to Review Flowsheets to unlock data.

## 2024-07-15 NOTE — Assessment & Plan Note (Signed)
 Psych manages and doing fairly well

## 2024-07-15 NOTE — Assessment & Plan Note (Signed)
 Diabetes associated with hypertension, hyperlipidemia, and depression  Ms. [name] is reminded of the importance of commitment to daily physical activity for 30 minutes or more, as able and the need to limit carbohydrate intake to 30 to 60 grams per meal to help with blood sugar control.   The need to take medication as prescribed, test blood sugar as directed, and to call between visits if there is a concern that blood sugar is uncontrolled is also discussed.   Ms. Jane English is reminded of the importance of daily foot exam, annual eye examination, and good blood sugar, blood pressure and cholesterol control.     Latest Ref Rng & Units 07/01/2024   11:38 AM 03/25/2024   10:44 AM 12/22/2023    3:06 PM 12/17/2023   10:36 AM 07/01/2023    9:28 AM  Diabetic Labs  HbA1c 4.8 - 5.6 % 6.0    5.9  6.0   Micro/Creat Ratio 0 - 29 mg/g creat    5    Chol 100 - 199 mg/dL  858    795   HDL >60 mg/dL  63    66   Calc LDL 0 - 99 mg/dL  64    94   Triglycerides 0 - 149 mg/dL  69    731   Creatinine 0.57 - 1.00 mg/dL 8.99  9.16  9.21  9.03  0.69       07/01/2024   10:45 AM 06/30/2024    8:41 AM 06/28/2024    4:33 PM 06/28/2024    9:00 AM 06/23/2024   11:32 AM 06/14/2024    2:00 PM 05/31/2024    1:04 PM  BP/Weight  Systolic BP 103 123  113 106    Diastolic BP 68 80  74 48    Wt. (Lbs) 171 172.5  171.8 170.4    BMI 27.6 kg/m2 27.84 kg/m2  27.56 kg/m2 27.34 kg/m2       Information is confidential and restricted. Go to Review Flowsheets to unlock data.      Latest Ref Rng & Units 12/17/2023    9:40 AM 07/10/2023   12:00 AM  Foot/eye exam completion dates  Eye Exam No Retinopathy  No Retinopathy      Foot Form Completion  Done      This result is from an external source.

## 2024-07-15 NOTE — Assessment & Plan Note (Signed)
  Patient re-educated about  the importance of commitment to a  minimum of 150 minutes of exercise per week as able.  The importance of healthy food choices with portion control discussed, as well as eating regularly and within a 12 hour window most days. The need to choose clean , green food 50 to 75% of the time is discussed, as well as to make water the primary drink and set a goal of 64 ounces water daily.       07/01/2024   10:45 AM 06/30/2024    8:41 AM 06/28/2024    4:33 PM  Weight /BMI  Weight 171 lb 172 lb 8 oz   Height 5' 6 (1.676 m) 5' 6 (1.676 m)   BMI 27.6 kg/m2 27.84 kg/m2      Information is confidential and restricted. Go to Review Flowsheets to unlock data.

## 2024-07-15 NOTE — Assessment & Plan Note (Signed)
 Managed byu Psych with close f/u with both Therapist has treatment every 2 weeks

## 2024-07-15 NOTE — Assessment & Plan Note (Signed)
 Hyperlipidemia:Low fat diet discussed and encouraged.   Lipid Panel  Lab Results  Component Value Date   CHOL 141 03/25/2024   HDL 63 03/25/2024   LDLCALC 64 03/25/2024   TRIG 69 03/25/2024   CHOLHDL 3.1 07/01/2023

## 2024-07-19 ENCOUNTER — Ambulatory Visit (INDEPENDENT_AMBULATORY_CARE_PROVIDER_SITE_OTHER): Admitting: Registered Nurse

## 2024-07-19 ENCOUNTER — Encounter (HOSPITAL_COMMUNITY): Payer: Self-pay | Admitting: Registered Nurse

## 2024-07-19 ENCOUNTER — Ambulatory Visit (INDEPENDENT_AMBULATORY_CARE_PROVIDER_SITE_OTHER): Admitting: Adult Health

## 2024-07-19 ENCOUNTER — Encounter: Payer: Self-pay | Admitting: Adult Health

## 2024-07-19 ENCOUNTER — Other Ambulatory Visit (HOSPITAL_COMMUNITY)
Admission: RE | Admit: 2024-07-19 | Discharge: 2024-07-19 | Disposition: A | Discharge status: Home / Self Care | Source: Ambulatory Visit | Attending: Adult Health | Admitting: Adult Health

## 2024-07-19 VITALS — BP 116/77 | HR 88 | Ht 66.0 in | Wt 171.8 lb

## 2024-07-19 VITALS — BP 115/72 | HR 84 | Ht 66.0 in | Wt 173.0 lb

## 2024-07-19 DIAGNOSIS — Z124 Encounter for screening for malignant neoplasm of cervix: Secondary | ICD-10-CM | POA: Diagnosis not present

## 2024-07-19 DIAGNOSIS — Z1151 Encounter for screening for human papillomavirus (HPV): Secondary | ICD-10-CM | POA: Insufficient documentation

## 2024-07-19 DIAGNOSIS — Z1211 Encounter for screening for malignant neoplasm of colon: Secondary | ICD-10-CM | POA: Diagnosis not present

## 2024-07-19 DIAGNOSIS — N644 Mastodynia: Secondary | ICD-10-CM | POA: Diagnosis not present

## 2024-07-19 DIAGNOSIS — F411 Generalized anxiety disorder: Secondary | ICD-10-CM | POA: Diagnosis not present

## 2024-07-19 DIAGNOSIS — F32A Depression, unspecified: Secondary | ICD-10-CM | POA: Diagnosis not present

## 2024-07-19 DIAGNOSIS — Z01419 Encounter for gynecological examination (general) (routine) without abnormal findings: Secondary | ICD-10-CM | POA: Diagnosis not present

## 2024-07-19 DIAGNOSIS — F419 Anxiety disorder, unspecified: Secondary | ICD-10-CM

## 2024-07-19 DIAGNOSIS — F331 Major depressive disorder, recurrent, moderate: Secondary | ICD-10-CM

## 2024-07-19 LAB — HEMOCCULT GUIAC POC 1CARD (OFFICE): Fecal Occult Blood, POC: NEGATIVE

## 2024-07-19 MED ORDER — FLUOXETINE HCL 20 MG PO CAPS: 20.0000 mg | ORAL_CAPSULE | Freq: Every day | ORAL | 0 refills | Status: DC

## 2024-07-19 MED ORDER — HYDROXYZINE HCL 25 MG PO TABS: 25.0000 mg | ORAL_TABLET | Freq: Three times a day (TID) | ORAL | 0 refills | Status: DC | PRN
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
## 2024-07-19 NOTE — Progress Notes (Signed)
 Patient ID: Jane English, female   DOB: Oct 28, 1971, 53 y.o.   MRN: [id number] History of Present Illness: Jane English is a  53 year old black female, separated, PM in for a well woman gyn exam and pap. Her pap last year was ASCUS negative HPV  PCP is Dr Antonetta   Current Medications, Allergies, Past Medical History, Past Surgical History, Family History and Social History were reviewed in [employer] electronic medical record.     Review of Systems: Patient denies any headaches, hearing loss, fatigue, blurred vision, shortness of breath, chest pain, abdominal pain, problems with bowel movements, urination, or intercourse(not active). No joint pain or mood swings.  Denies any vaginal bleeding Still has pain right breast area, had normal mammogram and US  07/13/24, feels like may be neck related now   Physical Exam:BP 115/72 (BP Location: Left Arm, Patient Position: Sitting, Cuff Size: Normal)   Pulse 84   Ht 5' 6 (1.676 m)   Wt 173 lb (78.5 kg)   LMP 01/14/2019 Comment: tubal ligation  BMI 27.92 kg/m   General:  Well developed, well nourished, no acute distress Skin:  Warm and dry Neck:  Midline trachea, normal thyroid , good ROM, no lymphadenopathy Lungs; Clear to auscultation bilaterally Breast:  No dominant palpable mass, retraction, or nipple discharge Cardiovascular: Regular rate and rhythm Abdomen:  Soft, non tender, no hepatosplenomegaly Pelvic:  External genitalia is normal in appearance, no lesions,she has a clit rod.  The vagina is normal in appearance.  Urethra has no lesions or masses. The cervix is smooth, pap with HR HPV genotyping performed.  Uterus is felt to be normal size, shape, and contour.  No adnexal masses or tenderness noted.Bladder is non tender, no masses felt. Rectal: Good sphincter tone, no polyps, or hemorrhoids felt.  Hemoccult negative. Extremities/musculoskeletal:  No swelling or varicosities noted, no clubbing or cyanosis Psych:  No mood changes, alert and  cooperative,seems happy AA is 0 Fall risk is low    07/19/2024   11:02 AM 07/01/2024   10:47 AM 06/30/2024    8:50 AM  Depression screen PHQ 2/9  Decreased Interest 2 2 2   Down, Depressed, Hopeless 2 2 2   PHQ - 2 Score 4 4 4   Altered sleeping 0 1 1  Tired, decreased energy 2 1 1   Change in appetite 0 1 1  Feeling bad or failure about yourself  0 1 1  Trouble concentrating 0 0 0  Moving slowly or fidgety/restless 0 0 0  Suicidal thoughts 0 0 0  PHQ-9 Score 6 8 8   Difficult doing work/chores  Somewhat difficult        07/19/2024   11:02 AM 07/01/2024   10:47 AM 06/30/2024    8:50 AM 06/28/2024    4:35 PM  GAD 7 : Generalized Anxiety Score  Nervous, Anxious, on Edge 0 2 2   Control/stop worrying 2 2 2    Worry too much - different things 2 2 2    Trouble relaxing 0 1 1   Restless 0 1 1   Easily annoyed or irritable 0 1 1   Afraid - awful might happen 0 0 0   Total GAD 7 Score 4 9 9    Anxiety Difficulty  Somewhat difficult       Information is confidential and restricted. Go to Review Flowsheets to unlock data.      Upstream - 07/19/24 1059       Pregnancy Intention Screening   Does the patient want to become  pregnant in the next year? N/A    Does the patient's partner want to become pregnant in the next year? N/A    Would the patient like to discuss contraceptive options today? N/A      Contraception Wrap Up   Current Method Female Sterilization   PM   End Method Female Sterilization   PM   Contraception Counseling Provided No          Examination chaperoned by Clarita Salt LPN  Impression and plan: 1. Encounter for gynecological examination with Papanicolaou smear of cervix (Primary) Pap sent Physical in 1 year Labs with PCP Had negative mammogram 04/09/24 and another 07/13/24 Colonoscopy due 2027  - Cytology - PAP( [place])  2. Encounter for screening fecal occult blood testing Hemoccult was negative  - POCT occult blood stool  3. Breast tenderness Has  burning in upper right breast, has normal mammogram 07/13/24, ?related to neck to see PCP   4. Anxiety and depression She is on meds with PCP

## 2024-07-19 NOTE — Patient Instructions (Signed)
 Call 911, [phone], mobile crisis, or present to the nearest emergency room should you experience any suicidal/homicidal ideation, auditory/visual/hallucinations, or detrimental worsening of your mental health.  Mobile Crisis Response Teams Listed by counties in vicinity of [facility] providers [facility] [employer] [phone] [facility] [phone] [facility] [phone] [facility] [phone] [place]                * [facility] [phone]                * [facility] [phone]  [facility] [phone] [facility] [employer]  [phone] * [employer] [phone]

## 2024-07-21 LAB — CYTOLOGY - PAP
Comment: NEGATIVE
Diagnosis: NEGATIVE
High risk HPV: NEGATIVE

## 2024-07-22 ENCOUNTER — Ambulatory Visit: Payer: Self-pay | Admitting: Adult Health

## 2024-07-22 ENCOUNTER — Ambulatory Visit (INDEPENDENT_AMBULATORY_CARE_PROVIDER_SITE_OTHER): Admitting: Psychiatry

## 2024-07-22 DIAGNOSIS — F331 Major depressive disorder, recurrent, moderate: Secondary | ICD-10-CM

## 2024-07-22 DIAGNOSIS — F411 Generalized anxiety disorder: Secondary | ICD-10-CM | POA: Diagnosis not present
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
## 2024-07-26 DIAGNOSIS — I8311 Varicose veins of right lower extremity with inflammation: Secondary | ICD-10-CM | POA: Diagnosis not present

## 2024-07-26 DIAGNOSIS — I83893 Varicose veins of bilateral lower extremities with other complications: Secondary | ICD-10-CM | POA: Diagnosis not present

## 2024-07-26 DIAGNOSIS — R600 Localized edema: Secondary | ICD-10-CM | POA: Diagnosis not present

## 2024-07-26 DIAGNOSIS — I8312 Varicose veins of left lower extremity with inflammation: Secondary | ICD-10-CM | POA: Diagnosis not present

## 2024-07-28 ENCOUNTER — Other Ambulatory Visit: Payer: Self-pay

## 2024-07-28 ENCOUNTER — Encounter: Payer: Self-pay | Admitting: Allergy

## 2024-07-28 ENCOUNTER — Ambulatory Visit: Admitting: Family Medicine

## 2024-07-28 ENCOUNTER — Ambulatory Visit: Admitting: Allergy

## 2024-07-28 VITALS — BP 110/74 | HR 82 | Temp 97.6°F | Resp 16 | Ht 66.0 in | Wt 174.1 lb

## 2024-07-28 DIAGNOSIS — J4540 Moderate persistent asthma, uncomplicated: Secondary | ICD-10-CM | POA: Diagnosis not present

## 2024-07-28 DIAGNOSIS — J3089 Other allergic rhinitis: Secondary | ICD-10-CM

## 2024-07-28 DIAGNOSIS — B999 Unspecified infectious disease: Secondary | ICD-10-CM

## 2024-07-28 DIAGNOSIS — J302 Other seasonal allergic rhinitis: Secondary | ICD-10-CM

## 2024-07-28 MED ORDER — AZELASTINE HCL 0.1 % NA SOLN: 1.0000 | Freq: Two times a day (BID) | NASAL | 5 refills | Status: DC | PRN

## 2024-07-28 MED ORDER — PNEUMOCOCCAL VAC POLYVALENT 25 MCG/0.5ML IJ SOLN: 0.5000 mL | Freq: Once | INTRAMUSCULAR | 0 refills | Status: AC

## 2024-07-28 MED ORDER — BENRALIZUMAB 30 MG/ML ~~LOC~~ SOSY
30.0000 mg | PREFILLED_SYRINGE | SUBCUTANEOUS | Status: AC
  Administered 2024-07-28: 30 mg via SUBCUTANEOUS

## 2024-07-28 NOTE — Patient Instructions (Addendum)
 Environmental allergies Continue environmental control measures. Use over the counter antihistamines such as Zyrtec  (cetirizine ), Claritin  (loratadine ), Allegra (fexofenadine), or Xyzal (levocetirizine) daily as needed. May take twice a day during allergy  flares. May switch antihistamines every few months. Use Flonase  (fluticasone ) nasal spray 1-2 sprays per nostril once a day as needed for nasal congestion.  Use azelastine  nasal spray 1-2 sprays per nostril twice a day as needed for runny nose/drainage. Nasal saline spray (i.e., Simply Saline) or nasal saline lavage (i.e., NeilMed) is recommended as needed and prior to medicated nasal sprays. Recommend allergy  injections - asthma must be more stable before starting.  2 injections.  Had a detailed discussion with patient/family that clinical history is suggestive of allergic rhinitis, and may benefit from allergy  immunotherapy (AIT). Discussed in detail regarding the dosing, schedule, side effects (mild to moderate local allergic reaction and rarely systemic allergic reactions including anaphylaxis), and benefits (significant improvement in nasal symptoms, seasonal flares of asthma) of immunotherapy with the patient. There is significant time commitment involved with allergy  shots, which includes weekly immunotherapy injections for first 9-12 months and then biweekly to monthly injections for 3-5 years.   Asthma  Continue follow up with pulmonology.  Start Fasenra  injections - sample given today.  Tammy will be in touch with you regarding coverage.  Consent was signed.  Daily controller medication(s): continue Trelegy 200mcg 1 puff once a day and rinse mouth after each use.  Continue Singulair  (montelukast ) 10mg  daily at night. May use albuterol  rescue inhaler 2 puffs or nebulizer every 4 to 6 hours as needed for shortness of breath, chest tightness, coughing, and wheezing. May use albuterol  rescue inhaler 2 puffs 5 to 15 minutes prior to strenuous  physical activities. Monitor frequency of use - if you need to use it more than twice per week on a consistent basis let us  know.  Breathing control goals:  Full participation in all desired activities (may need albuterol  before activity) Albuterol  use two times or less a week on average (not counting use with activity) Cough interfering with sleep two times or less a month Oral steroids no more than once a year No hospitalizations   Infections Keep track of infections and antibiotics use. Find out if you got your pneumonia shot or not.  If you did not - please get the pneumovax 23  - prescription given.  Your pneumococcal titers were low. Sometimes people with low titers are more likely to develop respiratory infections caused by the bacteria strep pneumoniae. I would like for you to get the pneumovax 23  vaccine (also known as the pneumonia shot) as it can boost the levels and offer protection against this bacteria in the future. Once you get the vaccine, we check the levels 4 weeks afterwards to make sure your immune system responded to the vaccine appropriately. You can get the pneumovax vaccine at your PCP's office or pharmacy. If they don't offer it there, let us  know. Make sure it's the pneumovax 23  vaccine and NOT the prevnar.   Follow up in 3 months or sooner if needed.

## 2024-07-28 NOTE — Progress Notes (Signed)
 Immunotherapy   Patient Details  Name: Jane English MRN: [id number] Date of Birth: Sep 12, 1971  07/28/2024  Norlene LULLA Hunke started injections for Fasenra  30mg .  Frequency: Every 4 weeks x3 then 8 weeks. Consent signed and patient instructions given. Patient waited in office 15 minutes with no issues.    Isaiah LITTIE Shed 07/28/2024, 11:49 AM

## 2024-07-28 NOTE — Progress Notes (Signed)
 Follow Up Note  RE: Jane English MRN: [id number] DOB: [birth year] Date of Office Visit: 07/28/2024  Referring provider: Antonetta Rollene BRAVO, MD Primary care provider: Antonetta Rollene BRAVO, MD  Chief Complaint: Follow-up (She is having sore throat x 2 weeks) and Asthma (She is still having SOB, wheezing and coughing)  History of Present Illness: I had the pleasure of seeing Jane English for a follow up visit at the [facility] on 07/28/2024. She is a 53 y.o. female, who is being followed for asthma, chronic rhinitis and recurrent infections. Her previous allergy  office visit was on 05/21/2024 with Dr. Iva. Today is a regular follow up visit.  Discussed the use of AI scribe software for clinical note transcription with the patient, who gave verbal consent to proceed.    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 She uses her rescue inhaler every morning and night, which provides some relief, and a nebulizer when exposed to allergens like her dogs or when going outside.  Her current medication regimen includes Trelegy 200, one puff daily, which is a new addition. She also takes Singulair  and uses Flonase  nasal spray, two sprays per nostril daily. Zyrtec  is taken daily for her allergies, providing some relief. She experiences sneezing, coughing, congestion. No recent emergency room or urgent care visits for breathing issues have occurred.   She has a history of allergies to cats, dogs, grass, mold, and dust mites. Her allergies are sometimes unpredictable, causing sneezing and coughing.   In March, she experienced a severe episode of mucus and breathing difficulties, treated with steroids over a month.   She received a hepatitis B vaccine in August but is unsure if she has received a pneumonia shot. She  has two dogs, which  contribute to her allergy  symptoms.     Patient is frustrated at her asthma control and willing to start biologics. Lab results recommended Fasenra  back in July but she is not sure what happened to it.  06/02/2024 phone note: Spoke to patient she wants fesenra information mailed and is still Engineer, structural for injection codes. She is grateful for call she did have questions regarding where to go for pnuemovax injection and how to do labs after to check immunity. She will contact pcp or [facility] and then call us  back for lab slip. No other questions or concerns   06/23/2024 pulmonology visit: PFTs reviewed in detail  Asthma presents with moderate restriction and mid flow obstruction/BD response. on pulmonary function testing, accompanied by coughing, wheezing, and congestion. An elevated eosinophil count and allergies to dust, dogs, and trees suggest an allergic component. A previous CT scan showed clear lungs, Current treatment includes Breo and albuterol  as needed. Transition to Trelegy for stronger control due to its additional bronchodilator that opens airways more effectively than Breo.  Discontinue Breo and initiate Trelegy one puff daily. Restart Singulair  10 mg daily. Continue albuterol  inhaler or nebulizer as needed. Add Zyrtec  and Flonase  . Consider biologic injectables if symptoms persist after three months.   Chronic Rhinitis/Sinusitis   Chronic sinusitis with a severe sinus infection in March. Completed course of antibiotics. . Sinus congestion may exacerbate asthma symptoms. Use saline nasal rinse twice daily and follow up with a dentist to address potential dental contributions to sinusitis. Add Singulair , Flonase , Zyrtec  . Saline nasal rinses  Assessment and Plan: [name] is a  53 y.o. female with: Seasonal and perennial allergic rhinitis Past history - 2025 labs positive to cat, dog, trees.  Interim history - still having symptoms. Never got dymista .  Continue environmental control  measures. Use over the counter antihistamines such as Zyrtec  (cetirizine ), Claritin  (loratadine ), Allegra (fexofenadine), or Xyzal (levocetirizine) daily as needed. May take twice a day during allergy  flares. May switch antihistamines every few months. Use Flonase  (fluticasone ) nasal spray 1-2 sprays per nostril once a day as needed for nasal congestion.  Use azelastine  nasal spray 1-2 sprays per nostril twice a day as needed for runny nose/drainage. Nasal saline spray (i.e., Simply Saline) or nasal saline lavage (i.e., NeilMed) is recommended as needed and prior to medicated nasal sprays. Recommend allergy  injections - asthma must be more stable before starting.  2 injections.  Handout given.  Not well controlled moderate persistent asthma Follows with pulm and recently had switched to Trelegy in August with no benefit. Patient is wanting to start biologics. Uses albuterol  at least 1-2 times per day. Today's spirometry showed some mild obstruction. Continue follow up with pulmonology and their plan. Start Fasenra  injections per patient request - sample given today.  Tammy will be in touch with you regarding coverage.  Consent was signed.  Daily controller medication(s): continue Trelegy 200mcg 1 puff once a day and rinse mouth after each use.  Continue Singulair  (montelukast ) 10mg  daily at night. May use albuterol  rescue inhaler 2 puffs or nebulizer every 4 to 6 hours as needed for shortness of breath, chest tightness, coughing, and wheezing. May use albuterol  rescue inhaler 2 puffs 5 to 15 minutes prior to strenuous physical activities.  Recurrent infections She is not sure if she got the pneumonia shot. Reviewed EMR records and unable to find recent pneumovax vaccination.  Keep track of infections and antibiotics use. Find out if you got your pneumonia shot or not.  If you did not - please get the pneumovax 23  - prescription given.  Return in about 3 months (around 10/27/2024).  Meds  ordered this encounter  Medications   azelastine  (ASTELIN ) 0.1 % nasal spray    Sig: Place 1-2 sprays into both nostrils 2 (two) times daily as needed (nasal drainage). Use in each nostril as directed    Dispense:  30 mL    Refill:  5   pneumococcal 23 valent vaccine (PNEUMOVAX-23) 25 MCG/0.5ML injection    Sig: Inject 0.5 mLs into the muscle once for 1 dose.    Dispense:  0.5 mL    Refill:  0   benralizumab  (FASENRA ) prefilled syringe 30 mg   Lab Orders  No laboratory test(s) ordered today    Diagnostics: Spirometry:  Tracings reviewed. Her effort: It was hard to get consistent efforts and there is a question as to whether this reflects a maximal maneuver. FVC: 2.51L FEV1: 1.57L, 65% predicted FEV1/FVC ratio: 63% Interpretation: Spirometry consistent with mild obstructive disease.  Please see scanned spirometry results for details.  Results discussed with patient/family.   Medication List:  Current Outpatient Medications  Medication Sig Dispense Refill   albuterol  (PROVENTIL ) (2.5 MG/3ML) 0.083% nebulizer solution Take 3 mLs (2.5 mg total) by nebulization every 6 (six) hours as needed for wheezing or shortness of breath. 150 mL 1   albuterol  (VENTOLIN  HFA) 108 (90 Base) MCG/ACT inhaler TAKE 2 PUFFS BY MOUTH EVERY 6 HOURS AS NEEDED FOR WHEEZE OR SHORTNESS OF BREATH 18 each 4   amitriptyline  (ELAVIL ) 10 MG tablet TAKE 1 TABLET BY MOUTH EVERYDAY AT BEDTIME  90 tablet 2   azelastine  (ASTELIN ) 0.1 % nasal spray Place 1-2 sprays into both nostrils 2 (two) times daily as needed (nasal drainage). Use in each nostril as directed 30 mL 5   benzoyl peroxide  5 % external liquid Apply topically 2 (two) times daily. 142 g 12   cetirizine  (ZYRTEC ) 10 MG tablet Take 1 tablet (10 mg total) by mouth daily. 90 tablet 1   cloNIDine  (CATAPRES ) 0.1 MG tablet TAKE 1 TABLET BY MOUTH EVERYDAY AT BEDTIME 90 tablet 1   clotrimazole -betamethasone  (LOTRISONE ) cream APPLY TWICE DAILY FOR 5 DAYS TO AFFECTED  AREA(S) THEN AS NEEDED 45 g 1   cromolyn  (OPTICROM ) 4 % ophthalmic solution Place 1 drop into both eyes 4 (four) times daily. 10 mL 12   diclofenac  Sodium (VOLTAREN ) 1 % GEL APPLY 2 GRAMS TO AFFECTED AREA 4 TIMES A DAY 100 g 4   Fluocinolone  Acetonide Body 0.01 % OIL APPLY 1 APPLICATION TOPICALLY EVERY OTHER DAY. 118.28 mL 6   FLUoxetine  (PROZAC ) 20 MG capsule Take 1 capsule (20 mg total) by mouth daily. 90 capsule 0   fluticasone  (FLONASE ) 50 MCG/ACT nasal spray Place 1-2 sprays into both nostrils daily. 48 mL 1   Fluticasone -Umeclidin-Vilant (TRELEGY ELLIPTA ) 200-62.5-25 MCG/ACT AEPB Inhale 1 puff into the lungs daily. 1 each 5   hydrOXYzine  (ATARAX ) 25 MG tablet Take 1 tablet (25 mg total) by mouth 3 (three) times daily as needed. 90 tablet 0   ipratropium (ATROVENT ) 0.03 % nasal spray Place 2 sprays into both nostrils every 12 (twelve) hours. 30 mL 0   lidocaine  (LIDODERM ) 5 % Place 1 patch onto the skin daily. Remove & Discard patch within 12 hours or as directed by MD 30 patch 0   lidocaine  (XYLOCAINE ) 2 % jelly      linaclotide  (LINZESS ) 290 MCG CAPS capsule TAKE 1 CAPSULE (290 MCG TOTAL) BY MOUTH DAILY BEFORE BREAKFAST. 90 capsule 0   lubiprostone  (AMITIZA ) 24 MCG capsule TAKE 1 CAPSULE BY MOUTH TWICE A DAY WITH MEALS 180 capsule 0   magnesium  oxide (MAG-OX) 400 MG tablet Take 1 tablet (400 mg total) by mouth 2 (two) times daily. 90 tablet 3   meloxicam  (MOBIC ) 7.5 MG tablet TAKE 1 TABLET BY MOUTH 2 TIMES DAILY AS NEEDED FOR PAIN. 60 tablet 11   montelukast  (SINGULAIR ) 10 MG tablet TAKE 1 TABLET BY MOUTH EVERYDAY AT BEDTIME 30 tablet 5   Na Sulfate-K Sulfate-Mg Sulf 17.5-3.13-1.6 GM/177ML SOLN Take as directed 354 mL 0   NARCAN  4 MG/0.1ML LIQD nasal spray kit Place 4 mg into the nose as directed.  1   NONFORMULARY OR COMPOUNDED ITEM Apply 1-2 pumps, 3-4 times daily as needed. 1 each 0   olmesartan -hydrochlorothiazide  (BENICAR  HCT) 20-12.5 MG tablet TAKE 1 TABLET BY MOUTH EVERY DAY 90  tablet 1   ondansetron  (ZOFRAN ) 4 MG tablet Take 1 tablet (4 mg total) by mouth every 8 (eight) hours as needed for nausea or vomiting. 20 tablet 0   oxybutynin  (DITROPAN -XL) 10 MG 24 hr tablet TAKE 1 TABLET BY MOUTH EVERYDAY AT BEDTIME 90 tablet 2   oxyCODONE  (ROXICODONE ) 5 MG immediate release tablet Take 2 tablets (10 mg total) by mouth 2 (two) times daily. Do Not Fill Before 04/08/2024 120 tablet 0   pantoprazole  (PROTONIX ) 40 MG tablet Take 1 tablet (40 mg total) by mouth daily. 90 tablet 1   pneumococcal 23 valent vaccine (PNEUMOVAX-23) 25 MCG/0.5ML injection Inject 0.5 mLs into the muscle once for 1 dose. 0.5 mL  0   potassium chloride  20 MEQ/15ML (10%) SOLN Take 15 mLs (20 mEq total) by mouth 2 (two) times daily. 473 mL 1   rosuvastatin  (CRESTOR ) 5 MG tablet TAKE 1 TABLET (5 MG TOTAL) BY MOUTH DAILY. 90 tablet 1   solifenacin  (VESICARE ) 5 MG tablet TAKE 1 TABLET (5 MG TOTAL) BY MOUTH DAILY. 90 tablet 1   terbinafine  (LAMISIL ) 250 MG tablet Take 1 tablet (250 mg total) by mouth daily. 42 tablet 0   tirzepatide  (MOUNJARO ) 5 MG/0.5ML Pen Inject 5 mg into the skin once a week. 2 mL 5   topiramate  (TOPAMAX ) 25 MG tablet TAKE 1 TABLET BY MOUTH TWICE A DAY 180 tablet 3   trolamine salicylate (ASPERCREME) 10 % cream Apply 1 application topically as needed for muscle pain.      Current Facility-Administered Medications  Medication Dose Route Frequency Provider Last Rate Last Admin   benralizumab  (FASENRA ) prefilled syringe 30 mg  30 mg Subcutaneous Q28 days Luke Orlan HERO, DO   30 mg at 07/28/24 1157   Allergies: Allergies  Allergen Reactions   Neurontin [Gabapentin] Nausea And Vomiting and Other (See Comments)    Sleep walking/hallucinations    Penicillins Hives, Shortness Of Breath and Other (See Comments)    Has patient had a PCN reaction causing immediate rash, facial/tongue/throat swelling, SOB or lightheadedness with hypotension: Yes Has patient had a PCN reaction causing severe rash  involving mucus membranes or skin necrosis: No Has patient had a PCN reaction that required hospitalization: No Has patient had a PCN reaction occurring within the last 10 years: Yes If all of the above answers are NO, then may proceed with Cephalosporin use.   Pregabalin Anaphylaxis, Shortness Of Breath, Diarrhea and Swelling    Lyrica   Tramadol  Other (See Comments)    Makes patient delusional   Latex Rash   I reviewed her past medical history, social history, family history, and environmental history and no significant changes have been reported from her previous visit.  Review of Systems  Constitutional:  Negative for appetite change, chills, fever and unexpected weight change.  HENT:  Positive for congestion and rhinorrhea.   Eyes:  Negative for itching.  Respiratory:  Positive for cough, chest tightness, shortness of breath and wheezing.   Cardiovascular:  Negative for chest pain.  Gastrointestinal:  Negative for abdominal pain.  Genitourinary:  Negative for difficulty urinating.  Skin:  Negative for rash.  Allergic/Immunologic: Positive for environmental allergies.  Neurological:  Negative for headaches.    Objective: BP 110/74 (BP Location: Left Arm, Patient Position: Sitting, Cuff Size: Normal)   Pulse 82   Temp 97.6 F (36.4 C) (Temporal)   Resp 16   Ht 5' 6 (1.676 m)   Wt 174 lb 1.6 oz (79 kg)   LMP 01/14/2019 Comment: tubal ligation  SpO2 92%   BMI 28.10 kg/m  Body mass index is 28.1 kg/m. Physical Exam Vitals and nursing note reviewed.  Constitutional:      Appearance: Normal appearance. She is well-developed.  HENT:     Head: Normocephalic and atraumatic.     Right Ear: Tympanic membrane and external ear normal.     Left Ear: Tympanic membrane and external ear normal.     Nose: Nose normal.     Mouth/Throat:     Mouth: Mucous membranes are moist.     Pharynx: Oropharynx is clear.  Eyes:     Conjunctiva/sclera: Conjunctivae normal.   Cardiovascular:     Rate and Rhythm:  Normal rate and regular rhythm.     Heart sounds: Normal heart sounds. No murmur heard.    No friction rub. No gallop.  Pulmonary:     Effort: Pulmonary effort is normal.     Breath sounds: Normal breath sounds. No wheezing, rhonchi or rales.  Musculoskeletal:     Cervical back: Neck supple.  Skin:    General: Skin is warm.     Findings: No rash.  Neurological:     Mental Status: She is alert and oriented to person, place, and time.  Psychiatric:        Behavior: Behavior normal.    Previous notes and tests were reviewed. The plan was reviewed with the patient/family, and all questions/concerned were addressed.  It was my pleasure to see Jane English today and participate in her care. Please feel free to contact me with any questions or concerns.  Sincerely,  Orlan Cramp, DO Allergy  & Immunology  [facility]  [place] office: [phone] [facility] office: [phone]

## 2024-07-29 ENCOUNTER — Encounter: Attending: Physical Medicine and Rehabilitation | Admitting: Registered Nurse

## 2024-07-29 ENCOUNTER — Encounter: Payer: Self-pay | Admitting: Registered Nurse

## 2024-07-29 ENCOUNTER — Ambulatory Visit

## 2024-07-29 VITALS — BP 127/77 | HR 80 | Ht 66.0 in | Wt 174.6 lb

## 2024-07-29 DIAGNOSIS — Z5181 Encounter for therapeutic drug level monitoring: Secondary | ICD-10-CM | POA: Insufficient documentation

## 2024-07-29 DIAGNOSIS — M25562 Pain in left knee: Secondary | ICD-10-CM | POA: Diagnosis not present

## 2024-07-29 DIAGNOSIS — Z79899 Other long term (current) drug therapy: Secondary | ICD-10-CM | POA: Diagnosis not present

## 2024-07-29 DIAGNOSIS — M25511 Pain in right shoulder: Secondary | ICD-10-CM | POA: Diagnosis not present

## 2024-07-29 DIAGNOSIS — G5793 Unspecified mononeuropathy of bilateral lower limbs: Secondary | ICD-10-CM | POA: Insufficient documentation

## 2024-07-29 DIAGNOSIS — M961 Postlaminectomy syndrome, not elsewhere classified: Secondary | ICD-10-CM | POA: Insufficient documentation

## 2024-07-29 DIAGNOSIS — M25512 Pain in left shoulder: Secondary | ICD-10-CM | POA: Diagnosis not present

## 2024-07-29 DIAGNOSIS — G894 Chronic pain syndrome: Secondary | ICD-10-CM | POA: Diagnosis not present

## 2024-07-29 DIAGNOSIS — G8929 Other chronic pain: Secondary | ICD-10-CM | POA: Insufficient documentation

## 2024-07-29 DIAGNOSIS — M25561 Pain in right knee: Secondary | ICD-10-CM | POA: Diagnosis not present

## 2024-07-29 DIAGNOSIS — M48062 Spinal stenosis, lumbar region with neurogenic claudication: Secondary | ICD-10-CM | POA: Insufficient documentation

## 2024-07-29 MED ORDER — OXYCODONE HCL 5 MG PO TABS: 10.0000 mg | ORAL_TABLET | Freq: Two times a day (BID) | ORAL | 0 refills | Status: DC

## 2024-07-29 NOTE — Progress Notes (Signed)
 Subjective:    Patient ID: Jane English, female    DOB: 10/14/1971, 53 y.o.   MRN: [id number]  HPI: Jane English is a 53 y.o. female who returns for follow up appointment for chronic pain and medication refill. She states her pain is located in her bilateral shoulders, lower back, bilateral knee pain and bilateral feet with tingling and burning. She rates her pain 9. Her current exercise regime is walking and performing stretching exercises.  Jane English Morphine  equivalent is 30.00 MME.   UDS was Ordered today.      Pain Inventory Average Pain 8-9 Pain Right Now 9 My pain is intermittent, constant, sharp, burning, dull, stabbing, tingling, aching, and    In the last 24 hours, has pain interfered with the following? General activity 7 Relation with others 6-7 Enjoyment of life 10 What TIME of day is your pain at its worst? morning , evening, and night Sleep (in general) Fair  Pain is worse with: walking, bending, sitting, inactivity, standing, and some activites Pain improves with: rest, heat/ice, and medication Relief from Meds: 8  Family History  Problem Relation Age of Onset   Physical abuse Mother    Alcohol abuse Mother    Cirrhosis Mother    Lung cancer Father    Stomach cancer Father    Esophageal cancer Father    Alcohol abuse Father    Mental illness Father    Diabetes Sister    Hypertension Sister    Bipolar disorder Sister    Schizophrenia Sister    Diabetes Sister    Drug abuse Sister    HIV Sister    Pneumonia Sister        died as a baby   Alcohol abuse Brother    Hypertension Brother    Kidney disease Brother    Diabetes Brother    Drug abuse Brother    Mental illness Brother    Alcohol abuse Brother    Alcohol abuse Brother    Hypertension Brother    Diabetes Brother    Alcohol abuse Brother    Mental illness Brother        in [place]   Alcohol abuse Brother    Alcohol abuse Brother    Bipolar disorder Brother    Hypertension Brother     Bipolar disorder Brother    Drug abuse Brother    Alcohol abuse Brother    Bipolar disorder Brother    Bipolar disorder Daughter    Bipolar disorder Son    Bipolar disorder Son    ADD / ADHD Neg Hx    Anxiety disorder Neg Hx    Dementia Neg Hx    Depression Neg Hx    OCD Neg Hx    Seizures Neg Hx    Paranoid behavior Neg Hx    Colon cancer Neg Hx    Rectal cancer Neg Hx    Social History   Socioeconomic History   Marital status: Legally Separated    Spouse name: Jane English   Number of children: 4   Years of education: 12   Highest education level: GED or equivalent  Occupational History   Occupation: disabled  Tobacco Use   Smoking status: Never    Passive exposure: Never   Smokeless tobacco: Never  Vaping Use   Vaping status: Never Used  Substance and Sexual Activity   Alcohol use: No   Drug use: No   Sexual activity: Not Currently    Partners: Male  Birth control/protection: Surgical    Comment: tubal  Other Topics Concern   Not on file  Social History Narrative   Not on file   Social Drivers of Health   Financial Resource Strain: Low Risk  (07/19/2024)   Overall Financial Resource Strain (CARDIA)    Difficulty of Paying Living Expenses: Not hard at all  Recent Concern: Financial Resource Strain - High Risk (06/30/2024)   Overall Financial Resource Strain (CARDIA)    Difficulty of Paying Living Expenses: Hard  Food Insecurity: No Food Insecurity (07/19/2024)   Hunger Vital Sign    Worried About Running Out of Food in the Last Year: Never true    Ran Out of Food in the Last Year: Never true  Transportation Needs: No Transportation Needs (07/19/2024)   PRAPARE - Administrator, Civil Service (Medical): No    Lack of Transportation (Non-Medical): No  Physical Activity: Insufficiently Active (07/19/2024)   Exercise Vital Sign    Days of Exercise per Week: 3 days    Minutes of Exercise per Session: 20 min  Stress: No Stress Concern Present (07/19/2024)    [employer] of Occupational Health - Occupational Stress Questionnaire    Feeling of Stress: Not at all  Recent Concern: Stress - Stress Concern Present (06/30/2024)   [employer] of Occupational Health - Occupational Stress Questionnaire    Feeling of Stress: Very much  Social Connections: Moderately Integrated (07/19/2024)   Social Connection and Isolation Panel    Frequency of Communication with Friends and Family: More than three times a week    Frequency of Social Gatherings with Friends and Family: More than three times a week    Attends Religious Services: More than 4 times per year    Active Member of [employer] or Organizations: Yes    Attends Engineer, structural: More than 4 times per year    Marital Status: Separated  Recent Concern: Social Connections - Moderately Isolated (06/30/2024)   Social Connection and Isolation Panel    Frequency of Communication with Friends and Family: More than three times a week    Frequency of Social Gatherings with Friends and Family: Three times a week    Attends Religious Services: More than 4 times per year    Active Member of Clubs or Organizations: No    Attends Banker Meetings: Never    Marital Status: Separated   Past Surgical History:  Procedure Laterality Date   ADENOIDECTOMY     ANTERIOR CERVICAL DECOMP/DISCECTOMY FUSION  07/07/2012   Procedure: ANTERIOR CERVICAL DECOMPRESSION/DISCECTOMY FUSION 2 LEVELS;  Surgeon: Catalina CHRISTELLA Stains, MD;  Location: [facility];  Service: Neurosurgery;  Laterality: N/A;  Cervical four-five, five - six  Anterior cervical decompression/diskectomy/fusion/plate   APPENDECTOMY  11/04/1984   BOWEL RESECTION N/A 07/29/2013   Procedure: serosal repair;  Surgeon: Elspeth KYM Schultze, MD;  Location: [facility];  Service: General;  Laterality: N/A;   CARPAL TUNNEL RELEASE Bilateral    COLON SURGERY N/A    Phreesia 07/29/2020   LAPAROSCOPY N/A 07/29/2013   Procedure: diagnostic  laporoscopy;  Surgeon: Elspeth KYM Schultze, MD;  Location: [facility];  Service: General;  Laterality: N/A;   LAPAROSCOPY N/A 08/16/2013   Procedure: LAPAROSCOPY DIAGNOSTIC/LYSIS OF ADHESIONS;  Surgeon: Elspeth KYM Schultze, MD;  Location: [facility];  Service: General;  Laterality: N/A;   LAPAROTOMY N/A 08/16/2013   Procedure: EXPLORATORY LAPAROTOMY/SMALL BOWEL RESECTION (JEJUNUM);  Surgeon: Elspeth KYM Schultze, MD;  Location: [facility];  Service:  General;  Laterality: N/A;   LUMBAR SPINE SURGERY  11/04/2008   x 3   LYSIS OF ADHESION  11/04/2001   Dr. Keven Sharps   LYSIS OF ADHESION N/A 07/29/2013   Procedure: LYSIS OF ADHESION;  Surgeon: Elspeth KYM Schultze, MD;  Location: [facility];  Service: General;  Laterality: N/A;   OOPHORECTOMY     PARTIAL HYSTERECTOMY  1990s?   [place], [place]   SPINAL CORD STIMULATOR IMPLANT     spine stimulator removal  12/13/2022   SPINE SURGERY N/A    Phreesia 07/29/2020   TRIGGER FINGER RELEASE  11/05/2007   right pinkie finger   TUBAL LIGATION  11/04/1992   Past Surgical History:  Procedure Laterality Date   ADENOIDECTOMY     ANTERIOR CERVICAL DECOMP/DISCECTOMY FUSION  07/07/2012   Procedure: ANTERIOR CERVICAL DECOMPRESSION/DISCECTOMY FUSION 2 LEVELS;  Surgeon: Catalina CHRISTELLA Stains, MD;  Location: [facility];  Service: Neurosurgery;  Laterality: N/A;  Cervical four-five, five - six  Anterior cervical decompression/diskectomy/fusion/plate   APPENDECTOMY  11/04/1984   BOWEL RESECTION N/A 07/29/2013   Procedure: serosal repair;  Surgeon: Elspeth KYM Schultze, MD;  [facility];  Service: General;  Laterality: N/A;   CARPAL TUNNEL RELEASE Bilateral    COLON SURGERY N/A    Phreesia 07/29/2020   LAPAROSCOPY N/A 07/29/2013   Procedure: diagnostic laporoscopy;  Surgeon: Elspeth KYM Schultze, MD;  Location: [facility];  Service: General;  Laterality: N/A;   LAPAROSCOPY N/A 08/16/2013   Procedure: LAPAROSCOPY DIAGNOSTIC/LYSIS OF ADHESIONS;  Surgeon: Elspeth KYM Schultze, MD;  Location: [facility];  Service:  General;  Laterality: N/A;   LAPAROTOMY N/A 08/16/2013   Procedure: EXPLORATORY LAPAROTOMY/SMALL BOWEL RESECTION (JEJUNUM);  Surgeon: Elspeth KYM Schultze, MD;  [facility];  Service: General;  Laterality: N/A;   LUMBAR SPINE SURGERY  11/04/2008   x 3   LYSIS OF ADHESION  11/04/2001   Dr. Keven Sharps   LYSIS OF ADHESION N/A 07/29/2013   Procedure: LYSIS OF ADHESION;  Surgeon: Elspeth KYM Schultze, MD;  Location: [facility];  Service: General;  Laterality: N/A;   OOPHORECTOMY     PARTIAL HYSTERECTOMY  1990s?   [place], [place]   SPINAL CORD STIMULATOR IMPLANT     spine stimulator removal  12/13/2022   SPINE SURGERY N/A    Phreesia 07/29/2020   TRIGGER FINGER RELEASE  11/05/2007   right pinkie finger   TUBAL LIGATION  11/04/1992   Past Medical History:  Diagnosis Date   Anemia    Asthma    Asthma flare 04/09/2013   Back pain    Bronchitis    Chronic abdominal pain    Chronic constipation    Constipation due to opioid therapy    Depression    Depression, major, single episode, severe (HCC) 10/03/2018   PHQ 9 score of 15   Diabetes mellitus without complication (HCC)    Diabetes mellitus, type II (HCC)    DVT (deep venous thrombosis) (HCC) 2010   GERD (gastroesophageal reflux disease)    Heart murmur    no cardiologist   Helicobacter pylori gastritis 06/11/2013   Colonoscopy Dr. Albertus   Hypertension    IBS (irritable bowel syndrome)    Migraine headache    Neuropathy    Obesity    Obsessive-compulsive disorder    PSYCHOTIC D/O W/HALLUCINATIONS CONDS CLASS ELSW 03/04/2010   Qualifier: Diagnosis of  By: Antonetta MD, Margaret     PTSD (post-traumatic stress disorder)    SBO (small bowel obstruction) (HCC) 08/09/2013  Seasonal allergies 12/10/2012   Seizures (HCC)    Shortness of breath    BP 127/77 (BP Location: Left Arm, Patient Position: Sitting, Cuff Size: Normal)   Pulse 80   Ht 5' 6 (1.676 m)   Wt 174 lb 9.6 oz (79.2 kg)   LMP 01/14/2019 Comment: tubal ligation  SpO2 95%    BMI 28.18 kg/m   Opioid Risk Score:   Fall Risk Score:  `1  Depression screen [facility] 2/9     07/19/2024   11:02 AM 07/01/2024   10:47 AM 06/30/2024    8:50 AM 06/28/2024    4:34 PM 06/28/2024    8:57 AM 05/31/2024    1:09 PM 04/12/2024    2:14 PM  Depression screen PHQ 2/9  Decreased Interest 2 2 2   0    Down, Depressed, Hopeless 2 2 2   0    PHQ - 2 Score 4 4 4   0    Altered sleeping 0 1 1  0    Tired, decreased energy 2 1 1   0    Change in appetite 0 1 1  0    Feeling bad or failure about yourself  0 1 1  0    Trouble concentrating 0 0 0  0    Moving slowly or fidgety/restless 0 0 0  0    Suicidal thoughts 0 0 0  0    PHQ-9 Score 6 8 8   0    Difficult doing work/chores  Somewhat difficult   Somewhat difficult       Information is confidential and restricted. Go to Review Flowsheets to unlock data.      Review of Systems  Musculoskeletal:  Positive for back pain and neck pain.       Bilateral shoulder pain, neck pain, left hand pain, low back pain, bilateral knee and foot pain  All other systems reviewed and are negative.      Objective:   Physical Exam Vitals and nursing note reviewed.  Constitutional:      Appearance: Normal appearance.  Cardiovascular:     Rate and Rhythm: Normal rate and regular rhythm.     Pulses: Normal pulses.     Heart sounds: Normal heart sounds.  Pulmonary:     Effort: Pulmonary effort is normal.     Breath sounds: Normal breath sounds.  Musculoskeletal:     Comments: Normal Muscle Bulk and Muscle Testing Reveals:  Upper Extremities: Full ROM and Muscle Strength 5/5 Bilateral AC Joint Tenderness  Lumbar Paraspinal Tenderness: L-4-L-5 Lower Extremities: Full ROM and Muscle Strength 5/5 Bilateral Lower Extremities Flexion Produces Pain into her Bilateral Lower extremities.  Arises from Table with ease Narrow Based  Gait     Skin:    General: Skin is warm and dry.  Neurological:     Mental Status: She is alert and oriented to person,  place, and time.  Psychiatric:        Mood and Affect: Mood normal.        Behavior: Behavior normal.          Assessment & Plan:  1.Chronic Pain of Bilateral Shoulders L>R: Continue to alternate Ice and Heat therapy. Continue HEP as Tolerated. Continue to Monitor. 07/29/2024 2. Failed Back Syndrome of Lumbar Syndrome : Continue HEP as Tolerated. Continue current medication regimen. Continue to Monitor. 07/29/2024 3. Lumbar Radiculitis: No complaints today. Continue HEP as Tolerated. Continue to Monitor.07/29/2024 4. Bilateral Knee Pain:  Continue HEP as Tolerated. Continue to  Monitor.07/29/2024 5. Muscle Spasm of Shoulders/ Upper Back : Continue Flexeril . Continue to Monitor.07/29/2024 6. Chronic Pain Syndrome: Refilled: Oxycodone  5mg /325 mg two tablets twice a day #120. We will continue the opioid monitoring program, this consists of regular clinic visits, examinations, urine drug screen, pill counts as well as use of [place]  Controlled Substance Reporting system. A 12 month History has been reviewed on the [place]  Controlled Substance Reporting System on 07/29/2024. 7. Polyarthralgia: Continue HEP as Tolerated. Continue current medication regimen. Continue to monitor. 07/29/2024 8. Neuropathic Pain: Bilateral Feet: Continue current medication regimen. Continue to Monitor. 07/29/2024   F/U in 1 month

## 2024-07-29 NOTE — Addendum Note (Signed)
 Addended by: GEORGINA BARI CROME on: 07/29/2024 10:09 AM   Modules accepted: Orders

## 2024-07-30 ENCOUNTER — Ambulatory Visit: Admitting: Allergy & Immunology

## 2024-07-30 ENCOUNTER — Ambulatory Visit (INDEPENDENT_AMBULATORY_CARE_PROVIDER_SITE_OTHER)

## 2024-07-30 ENCOUNTER — Telehealth: Payer: Self-pay | Admitting: *Deleted

## 2024-07-30 DIAGNOSIS — Z23 Encounter for immunization: Secondary | ICD-10-CM | POA: Diagnosis not present

## 2024-07-30 MED ORDER — FASENRA PEN 30 MG/ML ~~LOC~~ SOAJ
30.0000 mg | SUBCUTANEOUS | 1 refills | Status: AC
  Filled 2024-08-06: qty 1, 28d supply, fill #0
  Filled 2024-09-07: qty 1, 56d supply, fill #1

## 2024-07-30 MED ORDER — FASENRA PEN 30 MG/ML ~~LOC~~ SOAJ
30.0000 mg | SUBCUTANEOUS | 6 refills | Status: AC
  Filled 2024-08-06 – 2024-11-10 (×5): qty 1, 56d supply, fill #0

## 2024-07-30 NOTE — Progress Notes (Signed)
 Patient is in office today for a nurse visit for Immunization. Patient Injection was given in the  Right deltoid. Patient tolerated injection well.

## 2024-07-30 NOTE — Telephone Encounter (Signed)
 L/m for patient to contact me to advise approval and submit for Fasenra  to [facility]

## 2024-07-30 NOTE — Telephone Encounter (Signed)
 Patient called back advised submit to [facility] for Fasenra  and advised delivery, storage and dosing for same. Patient to admin at home

## 2024-07-30 NOTE — Telephone Encounter (Signed)
-----   Message from Orlan CHRISTELLA Cramp sent at 07/28/2024 12:57 PM EDT ----- Madelin please start PA for Fasenra  for asthma. Sample given today. Thank you.

## 2024-08-02 ENCOUNTER — Other Ambulatory Visit: Payer: Self-pay

## 2024-08-02 ENCOUNTER — Other Ambulatory Visit (HOSPITAL_COMMUNITY): Payer: Self-pay

## 2024-08-02 LAB — DRUG TOX MONITOR 1 W/CONF, ORAL FLD
Amphetamines: NEGATIVE ng/mL (ref <10)
Barbiturates: NEGATIVE ng/mL (ref <10)
Benzodiazepines: NEGATIVE ng/mL (ref <0.50)
Buprenorphine: NEGATIVE ng/mL (ref <0.10)
Cocaine: NEGATIVE ng/mL (ref <5.0)
Codeine: NEGATIVE ng/mL (ref <2.5)
Dihydrocodeine: NEGATIVE ng/mL (ref <2.5)
Fentanyl: NEGATIVE ng/mL (ref <0.10)
Heroin Metabolite: NEGATIVE ng/mL (ref <1.0)
Hydrocodone: NEGATIVE ng/mL (ref <2.5)
Hydromorphone: NEGATIVE ng/mL (ref <2.5)
MARIJUANA: NEGATIVE ng/mL (ref <2.5)
MDMA: NEGATIVE ng/mL (ref <10)
Meprobamate: NEGATIVE ng/mL (ref <2.5)
Methadone: NEGATIVE ng/mL (ref <5.0)
Morphine: NEGATIVE ng/mL (ref <2.5)
Nicotine Metabolite: NEGATIVE ng/mL (ref <5.0)
Norhydrocodone: NEGATIVE ng/mL (ref <2.5)
Noroxycodone: 2.7 ng/mL — ABNORMAL HIGH (ref <2.5)
Opiates: POSITIVE ng/mL — AB (ref <2.5)
Oxycodone: 16.8 ng/mL — ABNORMAL HIGH (ref <2.5)
Oxymorphone: NEGATIVE ng/mL (ref <2.5)
Phencyclidine: NEGATIVE ng/mL (ref <10)
Tapentadol: NEGATIVE ng/mL (ref <5.0)
Tramadol: NEGATIVE ng/mL (ref <5.0)
Zolpidem: NEGATIVE ng/mL (ref <5.0)

## 2024-08-02 LAB — DRUG TOX ALC METAB W/CON, ORAL FLD: Alcohol Metabolite: NEGATIVE ng/mL (ref <25)

## 2024-08-03 LAB — TOXASSURE SELECT,+ANTIDEPR,UR

## 2024-08-06 ENCOUNTER — Other Ambulatory Visit: Payer: Self-pay

## 2024-08-06 ENCOUNTER — Other Ambulatory Visit (HOSPITAL_COMMUNITY): Payer: Self-pay

## 2024-08-06 ENCOUNTER — Other Ambulatory Visit: Payer: Self-pay | Admitting: Internal Medicine

## 2024-08-06 NOTE — Progress Notes (Signed)
 Specialty Pharmacy Ongoing Clinical Assessment Note  Jane English is a 53 y.o. female who is being followed by the specialty pharmacy service for RxSp Asthma/COPD   [name] specialty medication(s) reviewed today: Benralizumab  (Fasenra  Pen, FASENRA )   Missed doses in the last 4 weeks: No data recorded  Patient/Caregiver did not have any additional questions or concerns.   Therapeutic benefit summary: Unable to assess   Adverse events/side effects summary: Unable to assess   [name] therapy is appropriate to: Initiate    Goals Addressed             This Visit's Progress    Reduce disease symptoms including coughing and shortness of breath       Patient is initiating therapy. Patient will maintain adherence and avoid flare triggers         Follow up: 12 months  Dhrithi Riche M Keigen Caddell Specialty Pharmacist

## 2024-08-06 NOTE — Progress Notes (Signed)
 Specialty Pharmacy Initial Fill Coordination Note  Jane English is a 53 y.o. female contacted today regarding initial fill of specialty medication(s) Benralizumab  (Fasenra  Pen, FASENRA )   Patient requested Delivery   Delivery date: 08/18/24   Verified address: [address]  [place] [id number]   Medication will be filled on 10/14.   Patient is aware of $0.00 copayment.

## 2024-08-13 ENCOUNTER — Telehealth (HOSPITAL_COMMUNITY): Payer: Self-pay | Admitting: *Deleted

## 2024-08-13 NOTE — Telephone Encounter (Signed)
 Patient [facility] requesting 90 days supply for patient Hydroxyzine  HCL 25mg . Pharmacy is needing quantity of 270 tablets.

## 2024-08-17 ENCOUNTER — Other Ambulatory Visit (HOSPITAL_COMMUNITY): Payer: Self-pay

## 2024-08-17 ENCOUNTER — Other Ambulatory Visit (HOSPITAL_COMMUNITY): Payer: Self-pay | Admitting: Registered Nurse

## 2024-08-17 ENCOUNTER — Other Ambulatory Visit: Payer: Self-pay

## 2024-08-17 MED ORDER — HYDROXYZINE PAMOATE 25 MG PO CAPS: 25.0000 mg | ORAL_CAPSULE | Freq: Three times a day (TID) | ORAL | 0 refills | Status: DC | PRN

## 2024-08-17 NOTE — Telephone Encounter (Signed)
 Per pt last visit on 07-19-2024, patient was instructed to f/u in 2 months.  Now patient pharmacy is requesting refills for patient Hydroxyzine  HCL 25mg  with a quantity of 270.

## 2024-08-19 ENCOUNTER — Other Ambulatory Visit: Payer: Self-pay | Admitting: Family Medicine

## 2024-08-23 ENCOUNTER — Encounter: Attending: Physical Medicine and Rehabilitation | Admitting: Physical Medicine and Rehabilitation

## 2024-08-23 VITALS — BP 157/85 | HR 79 | Ht 66.0 in | Wt 174.0 lb

## 2024-08-23 DIAGNOSIS — M48062 Spinal stenosis, lumbar region with neurogenic claudication: Secondary | ICD-10-CM | POA: Insufficient documentation

## 2024-08-23 DIAGNOSIS — M79671 Pain in right foot: Secondary | ICD-10-CM | POA: Insufficient documentation

## 2024-08-23 MED ORDER — CAPSAICIN-CLEANSING GEL 8 % EX KIT
4.0000 | PACK | Freq: Once | CUTANEOUS | Status: AC
  Administered 2024-08-23: 4 via TOPICAL

## 2024-08-23 NOTE — Addendum Note (Signed)
 Addended by: JAMA FLEMING T on: 08/23/2024 04:08 PM   Modules accepted: Orders

## 2024-08-23 NOTE — Progress Notes (Signed)
-  Discussed Qutenza as an option for neuropathic pain control. Discussed that this is a capsaicin patch, stronger than capsaicin cream. Discussed that it is currently approved for diabetic peripheral neuropathy and post-herpetic neuralgia, but that it has also shown benefit in treating other forms of neuropathy. Provided patient with link to site to learn more about the patch: https://www.clark.biz/. Discussed that the patch would be placed in office and benefits usually last 3 months. Discussed that unintended exposure to capsaicin can cause severe irritation of eyes, mucous membranes, respiratory tract, and skin, but that Qutenza is a local treatment and does not have the systemic side effects of other nerve medications. Discussed that there may be pain, itching, erythema, and decreased sensory function associated with the application of Qutenza. Side effects usually subside within 1 week. A cold pack of analgesic medications can help with these side effects. Blood pressure can also be increased due to pain associated with administration of the patch.   3 patches of Qutenza [id number]) was applied to bilateral cervical spine, bilateral lumbar spine, and right foot. Ice packs were applied during the procedure to ensure patient comfort. Blood pressure was monitored every 15 minutes. The patient tolerated the procedure well. Post-procedure instructions were given and follow-up has been scheduled.  Topical system measures 14cm x20cm (280cm for a total 1120units) were applied which will cause deeper penetration for destruction of the peripheral nerve using a chemical (Qutenza) which infuses into the skin like an injection and heat technique (occlusive, compressive dressing cauing endothermic heat technique)

## 2024-08-30 ENCOUNTER — Other Ambulatory Visit: Payer: Self-pay | Admitting: Physical Medicine and Rehabilitation

## 2024-08-30 ENCOUNTER — Telehealth: Payer: Self-pay | Admitting: Physical Medicine and Rehabilitation

## 2024-08-30 ENCOUNTER — Telehealth: Payer: Self-pay | Admitting: Specialist

## 2024-08-30 DIAGNOSIS — M961 Postlaminectomy syndrome, not elsewhere classified: Secondary | ICD-10-CM

## 2024-08-30 DIAGNOSIS — M48062 Spinal stenosis, lumbar region with neurogenic claudication: Secondary | ICD-10-CM

## 2024-08-30 DIAGNOSIS — G894 Chronic pain syndrome: Secondary | ICD-10-CM

## 2024-08-30 DIAGNOSIS — G8929 Other chronic pain: Secondary | ICD-10-CM

## 2024-08-30 MED ORDER — OXYCODONE HCL 5 MG PO TABS: 10.0000 mg | ORAL_TABLET | Freq: Two times a day (BID) | ORAL | 0 refills | Status: DC

## 2024-08-30 NOTE — Telephone Encounter (Signed)
 Patient wanted to let you know that the Qutenza patches didn't work.

## 2024-08-30 NOTE — Telephone Encounter (Signed)
 Patient would like a refill called in to pharmacy for oxycodone .  She would also like Dr. Lorilee to call her so that she can share what is going on with her patch. Heather HILARIO Elbe, MHA, OT/L [phone]

## 2024-08-31 ENCOUNTER — Ambulatory Visit (HOSPITAL_COMMUNITY): Admitting: Psychiatry

## 2024-09-03 ENCOUNTER — Ambulatory Visit (INDEPENDENT_AMBULATORY_CARE_PROVIDER_SITE_OTHER)

## 2024-09-03 DIAGNOSIS — Z23 Encounter for immunization: Secondary | ICD-10-CM

## 2024-09-03 NOTE — Progress Notes (Signed)
 Patient is in office today for a nurse visit for Immunization. Patient Injection was given in the  Left deltoid. Patient tolerated injection well.

## 2024-09-07 ENCOUNTER — Other Ambulatory Visit (HOSPITAL_COMMUNITY): Payer: Self-pay

## 2024-09-08 ENCOUNTER — Other Ambulatory Visit: Payer: Self-pay | Admitting: Internal Medicine

## 2024-09-09 ENCOUNTER — Other Ambulatory Visit (HOSPITAL_BASED_OUTPATIENT_CLINIC_OR_DEPARTMENT_OTHER): Payer: Self-pay | Admitting: Pulmonary Disease

## 2024-09-09 ENCOUNTER — Other Ambulatory Visit (HOSPITAL_COMMUNITY): Payer: Self-pay

## 2024-09-10 ENCOUNTER — Other Ambulatory Visit (HOSPITAL_COMMUNITY): Payer: Self-pay

## 2024-09-10 ENCOUNTER — Other Ambulatory Visit: Payer: Self-pay

## 2024-09-10 NOTE — Progress Notes (Signed)
 Specialty Pharmacy Refill Coordination Note  Jane English is a 54 y.o. female contacted today regarding refills of specialty medication(s) Benralizumab  (Fasenra  Pen, FASENRA )   Patient requested Delivery   Delivery date: 09/16/24   Verified address: [address]  [place] [address]   Medication will be filled on: 09/15/24

## 2024-09-14 ENCOUNTER — Ambulatory Visit (INDEPENDENT_AMBULATORY_CARE_PROVIDER_SITE_OTHER): Admitting: Psychiatry

## 2024-09-14 DIAGNOSIS — F411 Generalized anxiety disorder: Secondary | ICD-10-CM | POA: Diagnosis not present

## 2024-09-14 DIAGNOSIS — F331 Major depressive disorder, recurrent, moderate: Secondary | ICD-10-CM
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
## 2024-09-15 ENCOUNTER — Other Ambulatory Visit: Payer: Self-pay

## 2024-09-20 ENCOUNTER — Ambulatory Visit (INDEPENDENT_AMBULATORY_CARE_PROVIDER_SITE_OTHER)

## 2024-09-20 ENCOUNTER — Telehealth: Payer: Self-pay | Admitting: Family Medicine

## 2024-09-20 ENCOUNTER — Ambulatory Visit (INDEPENDENT_AMBULATORY_CARE_PROVIDER_SITE_OTHER): Admitting: Registered Nurse

## 2024-09-20 ENCOUNTER — Ambulatory Visit: Admitting: Dermatology

## 2024-09-20 ENCOUNTER — Encounter: Payer: Self-pay | Admitting: Dermatology

## 2024-09-20 ENCOUNTER — Ambulatory Visit (INDEPENDENT_AMBULATORY_CARE_PROVIDER_SITE_OTHER): Admitting: Podiatry

## 2024-09-20 ENCOUNTER — Encounter (HOSPITAL_COMMUNITY): Payer: Self-pay | Admitting: Registered Nurse

## 2024-09-20 ENCOUNTER — Encounter: Payer: Self-pay | Admitting: Podiatry

## 2024-09-20 VITALS — BP 147/84 | HR 95

## 2024-09-20 VITALS — Ht 66.0 in | Wt 162.0 lb

## 2024-09-20 DIAGNOSIS — F411 Generalized anxiety disorder: Secondary | ICD-10-CM

## 2024-09-20 DIAGNOSIS — L234 Allergic contact dermatitis due to dyes: Secondary | ICD-10-CM | POA: Diagnosis not present

## 2024-09-20 DIAGNOSIS — B351 Tinea unguium: Secondary | ICD-10-CM | POA: Diagnosis not present

## 2024-09-20 DIAGNOSIS — F331 Major depressive disorder, recurrent, moderate: Secondary | ICD-10-CM

## 2024-09-20 DIAGNOSIS — M21621 Bunionette of right foot: Secondary | ICD-10-CM

## 2024-09-20 DIAGNOSIS — M21622 Bunionette of left foot: Secondary | ICD-10-CM | POA: Diagnosis not present

## 2024-09-20 DIAGNOSIS — L819 Disorder of pigmentation, unspecified: Secondary | ICD-10-CM

## 2024-09-20 DIAGNOSIS — M722 Plantar fascial fibromatosis: Secondary | ICD-10-CM

## 2024-09-20 DIAGNOSIS — L659 Nonscarring hair loss, unspecified: Secondary | ICD-10-CM

## 2024-09-20 DIAGNOSIS — L649 Androgenic alopecia, unspecified: Secondary | ICD-10-CM | POA: Diagnosis not present

## 2024-09-20 DIAGNOSIS — M205X9 Other deformities of toe(s) (acquired), unspecified foot: Secondary | ICD-10-CM

## 2024-09-20 MED ORDER — TRIAMCINOLONE ACETONIDE 10 MG/ML IJ SUSP
10.0000 mg | Freq: Once | INTRAMUSCULAR | Status: AC
  Administered 2024-09-20: 10 mg via INTRA_ARTICULAR

## 2024-09-20 MED ORDER — HYDROXYZINE PAMOATE 25 MG PO CAPS: 25.0000 mg | ORAL_CAPSULE | Freq: Three times a day (TID) | ORAL | 0 refills | Status: AC | PRN

## 2024-09-20 MED ORDER — SAFETY SEAL MISCELLANEOUS MISC: 5 refills | Status: AC

## 2024-09-20 MED ORDER — FLUOXETINE HCL 20 MG PO CAPS: 20.0000 mg | ORAL_CAPSULE | Freq: Every day | ORAL | 1 refills | Status: AC

## 2024-09-20 MED ORDER — IBUPROFEN 800 MG PO TABS: ORAL_TABLET | ORAL | 0 refills | Status: DC

## 2024-09-20 MED ORDER — FLUOXETINE HCL 10 MG PO CAPS: 10.0000 mg | ORAL_CAPSULE | Freq: Every day | ORAL | 1 refills | Status: AC

## 2024-09-20 MED ORDER — JUBLIA 10 % EX SOLN: CUTANEOUS | 11 refills | Status: AC

## 2024-09-20 NOTE — Progress Notes (Signed)
 Subjective:   Patient ID: Jane English, female   DOB: 53 y.o.   MRN: [id number]   HPI Patient presents with a lot of pain in the big toe joint right the outside of her right foot the heels of both feet and moderately in the left big toe joint.  States its gotten worse over the last 6 months to a year and she does not remember injury and gradually it is harder to walk or bear weight on the area.  Patient does not currently smoke likes to be active has diabetes with A1c under excellent control   Review of Systems  All other systems reviewed and are negative.       Objective:  Physical Exam Vitals and nursing note reviewed.  Constitutional:      Appearance: She is well-developed.  Pulmonary:     Effort: Pulmonary effort is normal.  Musculoskeletal:        General: Normal range of motion.  Skin:    General: Skin is warm.  Neurological:     Mental Status: She is alert.     Neurovascular status found to be intact muscle strength was found to be adequate range of motion within normal limits.  Patient is found to have exquisite discomfort in the first MPJ right fluid buildup around the joint surface and pain with palpation.  Patient has good digital perfusion well-oriented x 3 with lack of motion first MP joint no crepitus mild discomfort with fluid in the left and quite a bit of pain in the plantar heel bilateral.  Also noted on the right to have prominence of the fifth metatarsal head with redness and discomfort.  Patient has tried wider shoes cushioning and soaks without relief of symptoms     Assessment:  Significant hallux limitus deformity right with spur formation and reduced motion and tailor's bunion right and plantar fasciitis bilateral along with inflammation first MPJ left     Plan:  H&P x-rays taken reviewed and at this point it I went ahead and I discussed treatment options she states her right foot is hurting enough that she would like to have surgery on this and I  discussed with her surgery for the right foot including osteotomy with removal of bone spurs along with metatarsal osteotomy fifth right.  She wants surgery allowed her to read consent form going over alternative treatments complications and patient signed consent form after review schedule for outpatient surgery and she has her boot which she will bring with her.  I will also inject the left first MPJ due to the discomfort today I did sterile prep and I injected the plantar fascia bilateral 3 mg Kenalog  5 mg Xylocaine  with sterile dressings applied.  Patient states her A1c was around 5 point  X-rays indicate spur formation within the joint right first metatarsal along with dorsal spurring first metatarsal head elevation of the 4 5 intermetatarsal angle left

## 2024-09-20 NOTE — Telephone Encounter (Signed)
 Patient is wanting to know if she can have ibuprofen  called in for headache. Headache started yesterday and her daughter gave her one and it really helped but she wants her own prescription. Pls advise

## 2024-09-20 NOTE — Telephone Encounter (Signed)
 Pt aware- Also sent my chart message

## 2024-09-20 NOTE — Progress Notes (Signed)
   Follow-Up Visit   Subjective  Jane English is a 53 y.o. female established patient who presents for FOLLOW UP on the diagnoses listed below:  Patient (and/or pt guardian) consented to the use of AI-assisted tools for note generation.   Patient was last evaluated on 03/17/24.    AGA: Prescribed Minoxidil 10%/ Clobetasol 0.05% to apply QAM. Continue taking Nutrafol & Collagen supplement. Patient reports sxs are better. She stated that she is itchy at times rating it 7/10.   Add On: -Pt seen her PCP 65mo ago for nail fungus and Rx terbinafine  250mg  that that she took for 51mo. She is still having issues with both B/L big toe nails.   -She would like to discuss products for under eye darkness   Are you nursing, pregnant or trying to conceive? No   The following portions of the chart were reviewed this encounter and updated as appropriate: medications, allergies, medical history  Review of Systems:  No other skin or systemic complaints except as noted in HPI or Assessment and Plan.  Objective  Well appearing patient in no apparent distress; mood and affect are within normal limits.   A focused examination was performed of the following areas: scalp   Relevant exam findings are noted in the Assessment and Plan.                      Assessment & Plan   Androgenetic alopecia Hair regrowth noted since May. Current treatment with minoxidil and finasteride is effective in stopping hair thinning and promoting growth. Clobetasol discontinued due to resolution of inflammation. - Continue minoxidil 8% and finasteride. - Apply topical solution every morning. - Avoid tight hairstyles to prevent hair breakage.  Allergic contact dermatitis due to hair dye (PPD) Allergic reaction to hair dye containing PPD, resulting in facial swelling. Advised to avoid PPD-containing products. - Use hair dyes free of PPD, such as [employer] products. - Consider visiting [employer] hair salons or  consulting a trusted stylist for PPD-free options.  Onychomycosis of toenail Thickening and subungual debris present. Previous oral antifungal treatment was insufficient. Discussed treatment options including extended oral antifungal therapy or topical treatment with Jublia. Jublia preferred due to lower risk of liver toxicity compared to oral antifungals. - Prescribed Jublia topical solution from [facility]. - Continue using over-the-counter topical treatment as adjunct therapy.  Periorbital hyperpigmentation and volume loss Darkness under eyes primarily due to volume loss and vascular congestion. Minimal pigment present. Discussed treatment options including caffeine  serum and retinol eye cream to improve appearance over time. - Use caffeine  serum and Rock Retinol Eye Cream morning and night. - Apply Eastern Radiant Tone serum to address pigment. - Purchase additional products from [employer] or online as needed.  ALOPECIA   Related Medications Fluocinolone  Acetonide Body 0.01 % OIL APPLY 1 APPLICATION TOPICALLY EVERY OTHER DAY. Safety Seal Miscellaneous MISC Minoxidil 8% Finasteride 0.01% solution - apply to the affected areas of the scalp QAM. ONYCHOMYCOSIS   Related Medications Efinaconazole (JUBLIA) 10 % SOLN Apply to affected nails once daily.  Return in 1 year (on 09/20/2025) for ANDROGENETIC ALOPECIA.   Documentation: I have reviewed the above documentation for accuracy and completeness, and I agree with the above.  I, Shirron Maranda, CMA, am acting as scribe for [employer], DO.   Delon Lenis, DO

## 2024-09-20 NOTE — Telephone Encounter (Signed)
 Med is prescribed, advise hold on voltaren  gel when using ibuprofen  and meloxicam  is discontinued

## 2024-09-20 NOTE — Telephone Encounter (Signed)
 She has not taken the meloxicam  in a long time. States she is aware that they cannot be taken together and she is only wanting the ibuprofen . Pls advise

## 2024-09-20 NOTE — Patient Instructions (Signed)
 Call 911, [phone], mobile crisis, or present to the nearest emergency room should you experience any suicidal/homicidal ideation, auditory/visual/hallucinations, or detrimental worsening of your mental health.  Mobile Crisis Response Teams Listed by counties in vicinity of [facility] providers [facility] [employer] [phone] [facility] [phone] [facility] [phone] [facility] [phone] [place]                * [facility] [phone]                * [facility] [phone]  [facility] [phone] [facility] [employer]  [phone] * [employer] [phone]
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
## 2024-09-20 NOTE — Patient Instructions (Addendum)
 VISIT SUMMARY:  Today, we discussed your ongoing treatment for hair regrowth, toenail fungus, and concerns about under-eye darkness. Your hair regrowth is progressing well with your current treatment, and we have adjusted your toenail fungus treatment plan. We also talked about options for managing the darkness under your eyes.  YOUR PLAN:  -ANDROGENETIC ALOPECIA:  Androgenetic alopecia is a common form of hair loss.   Your current treatment with minoxidil and finasteride is effective in stopping hair thinning and promoting growth.   Continue applying the topical solution every morning and avoid tight hairstyles to prevent hair breakage.  -ALLERGIC CONTACT DERMATITIS DUE TO HAIR DYE (PPD):  Allergic contact dermatitis is a skin reaction caused by contact with an allergen. You have an allergy  to hair dye containing PPD, which caused facial swelling. Use hair dyes free of PPD, such as Aveda products, and consider visiting Aveda hair salons or consulting a trusted stylist for PPD-free options.  -ONYCHOMYCOSIS OF TOENAIL:  Onychomycosis is a fungal infection of the toenail. Your previous oral antifungal treatment was insufficient. We have prescribed Jublia topical solution, which has a lower risk of liver toxicity compared to oral antifungals. Continue using over-the-counter topical treatment as adjunct therapy.  -PERIORBITAL HYPERPIGMENTATION AND VOLUME LOSS:  Periorbital hyperpigmentation is darkness under the eyes, often due to volume loss and vascular congestion. Use caffeine  serum and Rock Retinol Eye Cream morning and night to improve the appearance over time. Apply Kimberly-clark serum to address pigment, and purchase additional products from [employer] or online as needed.  INSTRUCTIONS:  Please follow up as needed to monitor your progress with the treatments. If you experience any side effects or have concerns, contact our office.        .the  PPD Free hair color brand -  Aveda  Important Information  Due to recent changes in healthcare laws, you may see results of your pathology and/or laboratory studies on MyChart before the doctors have had a chance to review them. We understand that in some cases there may be results that are confusing or concerning to you. Please understand that not all results are received at the same time and often the doctors may need to interpret multiple results in order to provide you with the best plan of care or course of treatment. Therefore, we ask that you please give us  2 business days to thoroughly review all your results before contacting the office for clarification. Should we see a critical lab result, you will be contacted sooner.   If You Need Anything After Your Visit  If you have any questions or concerns for your doctor, please call our main line at [phone] If no one answers, please leave a voicemail as directed and we will return your call as soon as possible. Messages left after 4 pm will be answered the following business day.   You may also send us  a message via MyChart. We typically respond to MyChart messages within 1-2 business days.  For prescription refills, please ask your pharmacy to contact our office. Our fax number is [phone].  If you have an urgent issue when the clinic is closed that cannot wait until the next business day, you can page your doctor at the number below.    Please note that while we do our best to be available for urgent issues outside of office hours, we are not available 24/7.   If you have an urgent issue and are unable to reach us , you may choose to  seek medical care at your doctor's office, retail clinic, urgent care center, or emergency room.  If you have a medical emergency, please immediately call 911 or go to the emergency department. In the event of inclement weather, please call our main line at [phone] for an update on the status of any delays or  closures.  Dermatology Medication Tips: Please keep the boxes that topical medications come in in order to help keep track of the instructions about where and how to use these. Pharmacies typically print the medication instructions only on the boxes and not directly on the medication tubes.   If your medication is too expensive, please contact our office at [phone] or send us  a message through MyChart.   We are unable to tell what your co-pay for medications will be in advance as this is different depending on your insurance coverage. However, we may be able to find a substitute medication at lower cost or fill out paperwork to get insurance to cover a needed medication.   If a prior authorization is required to get your medication covered by your insurance company, please allow us  1-2 business days to complete this process.  Drug prices often vary depending on where the prescription is filled and some pharmacies may offer cheaper prices.  The website www.goodrx.com contains coupons for medications through different pharmacies. The prices here do not account for what the cost may be with help from insurance (it may be cheaper with your insurance), but the website can give you the price if you did not use any insurance.  - You can print the associated coupon and take it with your prescription to the pharmacy.  - You may also stop by our office during regular business hours and pick up a GoodRx coupon card.  - If you need your prescription sent electronically to a different pharmacy, notify our office through [facility] or by phone at [phone]

## 2024-09-21 ENCOUNTER — Encounter: Payer: Self-pay | Admitting: Registered Nurse

## 2024-09-21 ENCOUNTER — Telehealth: Payer: Self-pay | Admitting: Podiatry

## 2024-09-21 ENCOUNTER — Encounter: Attending: Physical Medicine and Rehabilitation | Admitting: Registered Nurse

## 2024-09-21 VITALS — BP 148/88 | HR 94 | Ht 66.0 in | Wt 173.4 lb

## 2024-09-21 DIAGNOSIS — M25512 Pain in left shoulder: Secondary | ICD-10-CM | POA: Insufficient documentation

## 2024-09-21 DIAGNOSIS — M25562 Pain in left knee: Secondary | ICD-10-CM | POA: Insufficient documentation

## 2024-09-21 DIAGNOSIS — M48062 Spinal stenosis, lumbar region with neurogenic claudication: Secondary | ICD-10-CM | POA: Diagnosis not present

## 2024-09-21 DIAGNOSIS — G894 Chronic pain syndrome: Secondary | ICD-10-CM | POA: Insufficient documentation

## 2024-09-21 DIAGNOSIS — M25561 Pain in right knee: Secondary | ICD-10-CM | POA: Insufficient documentation

## 2024-09-21 DIAGNOSIS — M79671 Pain in right foot: Secondary | ICD-10-CM | POA: Insufficient documentation

## 2024-09-21 DIAGNOSIS — G8929 Other chronic pain: Secondary | ICD-10-CM | POA: Insufficient documentation

## 2024-09-21 DIAGNOSIS — M79672 Pain in left foot: Secondary | ICD-10-CM | POA: Diagnosis present

## 2024-09-21 DIAGNOSIS — M961 Postlaminectomy syndrome, not elsewhere classified: Secondary | ICD-10-CM | POA: Diagnosis not present

## 2024-09-21 DIAGNOSIS — M25511 Pain in right shoulder: Secondary | ICD-10-CM | POA: Diagnosis present

## 2024-09-21 MED ORDER — OXYCODONE HCL 5 MG PO TABS: 10.0000 mg | ORAL_TABLET | Freq: Two times a day (BID) | ORAL | 0 refills | Status: DC

## 2024-09-21 NOTE — Progress Notes (Signed)
 Subjective:    Patient ID: Jane English, female    DOB: September 01, 1971, 53 y.o.   MRN: [id number] HPI: Jane English is a 53 y.o. female who returns for follow up appointment for chronic pain and medication refill. She states her pain is located in her bilateral shoulders, lower back, bilateral knee pain and bilateral feet with tingling and burning. She rates her pain 8-9. Her current exercise regime is walking and performing stretching exercises.  Ms. Merced Morphine  equivalent is 30.00 MME.   Last Oral Swab was Performed on 07/29/2024, it was consistent.    Pain Inventory Average Pain 8-9 Pain Right Now 9 My pain is sharp, burning, dull, stabbing, tingling, and aching  In the last 24 hours, has pain interfered with the following? General activity 8 Relation with others 8 Enjoyment of life 8 What TIME of day is your pain at its worst? morning , evening, and night Sleep (in general) Fair  Pain is worse with: walking, bending, sitting, inactivity, standing, unsure, and some activites Pain improves with: rest, heat/ice, therapy/exercise, medication, and injections Relief from Meds: 9-10  Family History  Problem Relation Age of Onset   Physical abuse Mother    Alcohol abuse Mother    Cirrhosis Mother    Lung cancer Father    Stomach cancer Father    Esophageal cancer Father    Alcohol abuse Father    Mental illness Father    Diabetes Sister    Hypertension Sister    Bipolar disorder Sister    Schizophrenia Sister    Diabetes Sister    Drug abuse Sister    HIV Sister    Pneumonia Sister        died as a baby   Alcohol abuse Brother    Hypertension Brother    Kidney disease Brother    Diabetes Brother    Drug abuse Brother    Mental illness Brother    Alcohol abuse Brother    Alcohol abuse Brother    Hypertension Brother    Diabetes Brother    Alcohol abuse Brother    Mental illness Brother        in [place]   Alcohol abuse Brother    Alcohol abuse Brother    Bipolar  disorder Brother    Hypertension Brother    Bipolar disorder Brother    Drug abuse Brother    Alcohol abuse Brother    Bipolar disorder Brother    Bipolar disorder Daughter    Bipolar disorder Son    Bipolar disorder Son    ADD / ADHD Neg Hx    Anxiety disorder Neg Hx    Dementia Neg Hx    Depression Neg Hx    OCD Neg Hx    Seizures Neg Hx    Paranoid behavior Neg Hx    Colon cancer Neg Hx    Rectal cancer Neg Hx    Social History   Socioeconomic History   Marital status: Legally Separated    Spouse name: Lamar   Number of children: 4   Years of education: 12   Highest education level: GED or equivalent  Occupational History   Occupation: disabled  Tobacco Use   Smoking status: Never    Passive exposure: Never   Smokeless tobacco: Never  Vaping Use   Vaping status: Never Used  Substance and Sexual Activity   Alcohol use: No   Drug use: No   Sexual activity: Not Currently  Partners: Male    Birth control/protection: Surgical    Comment: tubal  Other Topics Concern   Not on file  Social History Narrative   Not on file   Social Drivers of Health   Financial Resource Strain: Low Risk  (07/19/2024)   Overall Financial Resource Strain (CARDIA)    Difficulty of Paying Living Expenses: Not hard at all  Recent Concern: Financial Resource Strain - High Risk (06/30/2024)   Overall Financial Resource Strain (CARDIA)    Difficulty of Paying Living Expenses: Hard  Food Insecurity: No Food Insecurity (07/19/2024)   Hunger Vital Sign    Worried About Running Out of Food in the Last Year: Never true    Ran Out of Food in the Last Year: Never true  Transportation Needs: No Transportation Needs (07/19/2024)   PRAPARE - Administrator, Civil Service (Medical): No    Lack of Transportation (Non-Medical): No  Physical Activity: Insufficiently Active (07/19/2024)   Exercise Vital Sign    Days of Exercise per Week: 3 days    Minutes of Exercise per Session: 20 min   Stress: No Stress Concern Present (07/19/2024)   [employer] of Occupational Health - Occupational Stress Questionnaire    Feeling of Stress: Not at all  Recent Concern: Stress - Stress Concern Present (06/30/2024)   [employer] of Occupational Health - Occupational Stress Questionnaire    Feeling of Stress: Very much  Social Connections: Moderately Integrated (07/19/2024)   Social Connection and Isolation Panel    Frequency of Communication with Friends and Family: More than three times a week    Frequency of Social Gatherings with Friends and Family: More than three times a week    Attends Religious Services: More than 4 times per year    Active Member of [employer] or Organizations: Yes    Attends Engineer, Structural: More than 4 times per year    Marital Status: Separated  Recent Concern: Social Connections - Moderately Isolated (06/30/2024)   Social Connection and Isolation Panel    Frequency of Communication with Friends and Family: More than three times a week    Frequency of Social Gatherings with Friends and Family: Three times a week    Attends Religious Services: More than 4 times per year    Active Member of Clubs or Organizations: No    Attends Banker Meetings: Never    Marital Status: Separated   Past Surgical History:  Procedure Laterality Date   ADENOIDECTOMY     ANTERIOR CERVICAL DECOMP/DISCECTOMY FUSION  07/07/2012   Procedure: ANTERIOR CERVICAL DECOMPRESSION/DISCECTOMY FUSION 2 LEVELS;  Surgeon: Catalina CHRISTELLA Stains, MD;  Location: [facility];  Service: Neurosurgery;  Laterality: N/A;  Cervical four-five, five - six  Anterior cervical decompression/diskectomy/fusion/plate   APPENDECTOMY  11/04/1984   BOWEL RESECTION N/A 07/29/2013   Procedure: serosal repair;  Surgeon: Elspeth KYM Schultze, MD;  Location: [facility];  Service: General;  Laterality: N/A;   CARPAL TUNNEL RELEASE Bilateral    COLON SURGERY N/A    Phreesia 07/29/2020   LAPAROSCOPY  N/A 07/29/2013   Procedure: diagnostic laporoscopy;  Surgeon: Elspeth KYM Schultze, MD;  Location: [facility];  Service: General;  Laterality: N/A;   LAPAROSCOPY N/A 08/16/2013   Procedure: LAPAROSCOPY DIAGNOSTIC/LYSIS OF ADHESIONS;  Surgeon: Elspeth KYM Schultze, MD;  Location: [facility];  Service: General;  Laterality: N/A;   LAPAROTOMY N/A 08/16/2013   Procedure: EXPLORATORY LAPAROTOMY/SMALL BOWEL RESECTION (JEJUNUM);  Surgeon: Elspeth KYM Schultze, MD;  Location: [facility];  Service: General;  Laterality: N/A;   LUMBAR SPINE SURGERY  11/04/2008   x 3   LYSIS OF ADHESION  11/04/2001   Dr. Keven Sharps   LYSIS OF ADHESION N/A 07/29/2013   Procedure: LYSIS OF ADHESION;  Surgeon: Elspeth KYM Schultze, MD;  Location: [facility];  Service: General;  Laterality: N/A;   OOPHORECTOMY     PARTIAL HYSTERECTOMY  1990s?   [place], [place]   SPINAL CORD STIMULATOR IMPLANT     spine stimulator removal  12/13/2022   SPINE SURGERY N/A    Phreesia 07/29/2020   TRIGGER FINGER RELEASE  11/05/2007   right pinkie finger   TUBAL LIGATION  11/04/1992   Past Surgical History:  Procedure Laterality Date   ADENOIDECTOMY     ANTERIOR CERVICAL DECOMP/DISCECTOMY FUSION  07/07/2012   Procedure: ANTERIOR CERVICAL DECOMPRESSION/DISCECTOMY FUSION 2 LEVELS;  Surgeon: Catalina CHRISTELLA Stains, MD;  Location: [facility];  Service: Neurosurgery;  Laterality: N/A;  Cervical four-five, five - six  Anterior cervical decompression/diskectomy/fusion/plate   APPENDECTOMY  11/04/1984   BOWEL RESECTION N/A 07/29/2013   Procedure: serosal repair;  Surgeon: Elspeth KYM Schultze, MD;  [facility];  Service: General;  Laterality: N/A;   CARPAL TUNNEL RELEASE Bilateral    COLON SURGERY N/A    Phreesia 07/29/2020   LAPAROSCOPY N/A 07/29/2013   Procedure: diagnostic laporoscopy;  Surgeon: Elspeth KYM Schultze, MD;  [facility];  Service: General;  Laterality: N/A;   LAPAROSCOPY N/A 08/16/2013   Procedure: LAPAROSCOPY DIAGNOSTIC/LYSIS OF ADHESIONS;  Surgeon: Elspeth KYM Schultze, MD;  [facility];  Service: General;  Laterality: N/A;   LAPAROTOMY N/A 08/16/2013   Procedure: EXPLORATORY LAPAROTOMY/SMALL BOWEL RESECTION (JEJUNUM);  Surgeon: Elspeth KYM Schultze, MD;  [facility];  Service: General;  Laterality: N/A;   LUMBAR SPINE SURGERY  11/04/2008   x 3   LYSIS OF ADHESION  11/04/2001   Dr. Keven Sharps   LYSIS OF ADHESION N/A 07/29/2013   Procedure: LYSIS OF ADHESION;  Surgeon: Elspeth KYM Schultze, MD;  [facility];  Service: General;  Laterality: N/A;   OOPHORECTOMY     PARTIAL HYSTERECTOMY  1990s?   [place], [place]   SPINAL CORD STIMULATOR IMPLANT     spine stimulator removal  12/13/2022   SPINE SURGERY N/A    Phreesia 07/29/2020   TRIGGER FINGER RELEASE  11/05/2007   right pinkie finger   TUBAL LIGATION  11/04/1992   Past Medical History:  Diagnosis Date   Anemia    Asthma    Asthma flare 04/09/2013   Back pain    Bronchitis    Chronic abdominal pain    Chronic constipation    Constipation due to opioid therapy    Depression    Depression, major, single episode, severe (HCC) 10/03/2018   PHQ 9 score of 15   Diabetes mellitus without complication (HCC)    Diabetes mellitus, type II (HCC)    DVT (deep venous thrombosis) (HCC) 2010   GERD (gastroesophageal reflux disease)    Heart murmur    no cardiologist   Helicobacter pylori gastritis 06/11/2013   Colonoscopy Dr. Albertus   Hypertension    IBS (irritable bowel syndrome)    Migraine headache    Neuropathy    Obesity    Obsessive-compulsive disorder    PSYCHOTIC D/O W/HALLUCINATIONS CONDS CLASS ELSW 03/04/2010   Qualifier: Diagnosis of  By: Antonetta MD, Margaret     PTSD (post-traumatic stress disorder)    SBO (  small bowel obstruction) (HCC) 08/09/2013   Seasonal allergies 12/10/2012   Seizures (HCC)    Shortness of breath    BP (!) 148/88 (BP Location: Left Arm, Patient Position: Sitting, Cuff Size: Large)   Pulse 94   Ht 5' 6 (1.676 m)   Wt 173 lb 6.4 oz (78.7 kg)   LMP  01/14/2019 Comment: tubal ligation  SpO2 93%   BMI 27.99 kg/m   Opioid Risk Score:   Fall Risk Score:  `1  Depression screen [facility] 2/9     07/29/2024    9:10 AM 07/19/2024   11:02 AM 07/01/2024   10:47 AM 06/30/2024    8:50 AM 06/28/2024    4:34 PM 06/28/2024    8:57 AM 05/31/2024    1:09 PM  Depression screen PHQ 2/9  Decreased Interest 1 2 2 2   0   Down, Depressed, Hopeless 0 2 2 2   0   PHQ - 2 Score 1 4 4 4   0   Altered sleeping 1 0 1 1  0   Tired, decreased energy 1 2 1 1   0   Change in appetite 0 0 1 1  0   Feeling bad or failure about yourself  0 0 1 1  0   Trouble concentrating 0 0 0 0  0   Moving slowly or fidgety/restless 0 0 0 0  0   Suicidal thoughts 0 0 0 0  0   PHQ-9 Score 3  6  8  8    0    Difficult doing work/chores Somewhat difficult  Somewhat difficult   Somewhat difficult      Information is confidential and restricted. Go to Review Flowsheets to unlock data.   Data saved with a previous flowsheet row definition      Review of Systems  Musculoskeletal:  Positive for arthralgias and back pain.       Bilateral shoulder, hand, knee ankle and foot pain, low back pain  All other systems reviewed and are negative.      Objective:   Physical Exam Vitals and nursing note reviewed.  Constitutional:      Appearance: Normal appearance.  Cardiovascular:     Rate and Rhythm: Normal rate and regular rhythm.     Pulses: Normal pulses.     Heart sounds: Normal heart sounds.  Pulmonary:     Effort: Pulmonary effort is normal.     Breath sounds: Normal breath sounds.  Musculoskeletal:     Comments: Normal Muscle Bulk and Muscle Testing Reveals:  Upper Extremities: Full ROM and Muscle Strength 5/5 Bilateral AC Joint Tenderness  Thoracic and Lumbar Hypersensitivity Lower Extremities: Full ROM and Muscle Strength 5/5 Arises from Table with ease Narrow Based  Gait     Skin:    General: Skin is warm and dry.  Neurological:     Mental Status: She is alert and  oriented to person, place, and time.  Psychiatric:        Mood and Affect: Mood normal.        Behavior: Behavior normal.          Assessment & Plan:  1.Chronic Pain of Bilateral Shoulders L>R: Continue to alternate Ice and Heat therapy. Continue HEP as Tolerated. Continue to Monitor. 09/21/2024 2. Failed Back Syndrome of Lumbar Syndrome : Continue HEP as Tolerated. Continue current medication regimen. Continue to Monitor. 09/21/2024 3. Lumbar Radiculitis: No complaints today. Continue HEP as Tolerated. Continue to Monitor.09/21/2024 4. Bilateral Knee Pain:  Continue HEP as Tolerated. Continue to Monitor.09/21/2024 5. Muscle Spasm of Shoulders/ Upper Back : Continue Flexeril . Continue to Monitor.09/21/2024 6. Chronic Pain Syndrome: Refilled: Oxycodone  5mg /325 mg two tablets twice a day #120. We will continue the opioid monitoring program, this consists of regular clinic visits, examinations, urine drug screen, pill counts as well as use of [place]  Controlled Substance Reporting system. A 12 month History has been reviewed on the [place]  Controlled Substance Reporting System on 09/21/2024. 7. Polyarthralgia: Continue HEP as Tolerated. Continue current medication regimen. Continue to monitor. 09/21/2024 8. Neuropathic Pain: Bilateral Feet: Continue current medication regimen. Continue to Monitor. 09/21/2024 9. Bilateral Feet Pain: She reports she will be scheduled for surgery: Podiatry Following. She is awaiting surgical date. Continue to Monitor.   F/U in 1 month

## 2024-09-21 NOTE — Telephone Encounter (Signed)
 Called patient to schedule surgery. She is wanting to look at her schedule and contact back before setting an actual date.

## 2024-09-22 ENCOUNTER — Other Ambulatory Visit: Payer: Self-pay | Admitting: Internal Medicine

## 2024-09-23 ENCOUNTER — Ambulatory Visit: Admitting: Adult Health

## 2024-09-23 NOTE — Telephone Encounter (Signed)
 Patient contacted office to schedule surgery.Date set for 10/05/2024. Patient is on Mounjaro  and was advised to stop one week prior to date. Patient not on any blood thinners. Patient pharmacy correct in chart.

## 2024-09-24 ENCOUNTER — Telehealth: Payer: Self-pay

## 2024-09-24 ENCOUNTER — Other Ambulatory Visit: Payer: Self-pay

## 2024-09-24 ENCOUNTER — Telehealth: Payer: Self-pay | Admitting: Podiatry

## 2024-09-24 MED ORDER — CLOTRIMAZOLE-BETAMETHASONE 1-0.05 % EX CREA: TOPICAL_CREAM | CUTANEOUS | 1 refills | Status: AC

## 2024-09-24 NOTE — Telephone Encounter (Signed)
 DOS- 08/04/2024  [place] BUNIONECTOMY RT- 28296 5TH METATARSAL OSTEOTOMY RT- 28308  [facility] EFFECTIVE DATE- 08/04/2024  DEDUCTIBLE- $257 REMAINING- $0 OOP- $9350 REMAINING- $7712.88 FAMILY DEDUCTIBLE- $257 REMAINING- $0 COINSURANCE- 20%  PER [employer] PORTAL, NO PRIOR AUTHS ARE REQUIRED FOR CPT CODES 71703 AND 28308. DECISION ID# [id number]

## 2024-09-24 NOTE — Telephone Encounter (Signed)
 Sent!

## 2024-09-24 NOTE — Telephone Encounter (Signed)
 Received fax from [facility] on behalf of pt, pt requesting refill of Clotrimazole /Betameth Cream. Please advise

## 2024-10-01 ENCOUNTER — Other Ambulatory Visit: Payer: Self-pay | Admitting: Family Medicine

## 2024-10-03 ENCOUNTER — Other Ambulatory Visit: Payer: Self-pay | Admitting: Family Medicine

## 2024-10-04 MED ORDER — ONDANSETRON HCL 4 MG PO TABS: 4.0000 mg | ORAL_TABLET | Freq: Three times a day (TID) | ORAL | 0 refills | Status: AC | PRN

## 2024-10-04 MED ORDER — OXYCODONE-ACETAMINOPHEN 10-325 MG PO TABS: 1.0000 | ORAL_TABLET | ORAL | 0 refills | Status: DC | PRN

## 2024-10-04 NOTE — Addendum Note (Signed)
 Addended by: MAGDALEN PASCO RAMAN on: 10/04/2024 04:29 PM   Modules accepted: Orders

## 2024-10-05 DIAGNOSIS — M205X1 Other deformities of toe(s) (acquired), right foot: Secondary | ICD-10-CM | POA: Diagnosis not present

## 2024-10-05 DIAGNOSIS — M21621 Bunionette of right foot: Secondary | ICD-10-CM | POA: Diagnosis not present

## 2024-10-05 DIAGNOSIS — M722 Plantar fascial fibromatosis: Secondary | ICD-10-CM | POA: Diagnosis not present

## 2024-10-06 ENCOUNTER — Telehealth: Payer: Self-pay | Admitting: Podiatry

## 2024-10-06 NOTE — Telephone Encounter (Signed)
 Patient called in regards to prescription for knee scooter, requesting order be sent over to [facility] in [place]. Please advise

## 2024-10-07 ENCOUNTER — Other Ambulatory Visit: Payer: Self-pay | Admitting: Family Medicine

## 2024-10-07 DIAGNOSIS — Z0279 Encounter for issue of other medical certificate: Secondary | ICD-10-CM

## 2024-10-07 NOTE — Telephone Encounter (Unsigned)
 cld pt about 25 fee for forms. she may have Ms Massie her family member come pay, if not she wil pay 10/11/24 when she comes in and I will email her copy of what I fax [facility]TS Hr

## 2024-10-08 ENCOUNTER — Other Ambulatory Visit: Payer: Self-pay

## 2024-10-08 ENCOUNTER — Ambulatory Visit

## 2024-10-08 ENCOUNTER — Ambulatory Visit (INDEPENDENT_AMBULATORY_CARE_PROVIDER_SITE_OTHER): Admitting: Podiatry

## 2024-10-08 DIAGNOSIS — M21611 Bunion of right foot: Secondary | ICD-10-CM | POA: Diagnosis not present

## 2024-10-08 DIAGNOSIS — M21619 Bunion of unspecified foot: Secondary | ICD-10-CM

## 2024-10-08 NOTE — Telephone Encounter (Signed)
 Pt had someone come pay the 25 forms to be faxed [phone] and I emailed her a copy for her records RTW 11/29/24 approx  DOS 10/05/24

## 2024-10-09 MED ORDER — HYDROMORPHONE HCL 4 MG PO TABS: 4.0000 mg | ORAL_TABLET | ORAL | 0 refills | Status: AC | PRN

## 2024-10-09 NOTE — Progress Notes (Signed)
 Subjective:   Patient ID: Jane English, female   DOB: 53 y.o.   MRN: [id number]   HPI Patient presents stating that she is just getting a lot of pain in her foot and ankle and she was concerned about this it seems more than she expected with surgery   ROS      Objective:  Physical Exam  Neurovascular status is found to be intact negative Toula' sign noted bandage removed and I noted there to be excellent healing of the incision site moderate edema consistent with this.  Postop     Assessment:  Appears to be more of a postop inflammatory condition than anything else currently     Plan:  H&P reviewed sterile dressing reapplied I am going to change her pain medication and see if we can have better relief with this and at this point reapplied the dressing.  Strict instructions of any issues were to occur to let us  know immediately.  X-rays indicate the osteotomy is healing well alignment good fixation in place

## 2024-10-11 ENCOUNTER — Ambulatory Visit: Admitting: Podiatry

## 2024-10-11 ENCOUNTER — Ambulatory Visit (INDEPENDENT_AMBULATORY_CARE_PROVIDER_SITE_OTHER)

## 2024-10-11 ENCOUNTER — Encounter: Payer: Self-pay | Admitting: Podiatry

## 2024-10-11 DIAGNOSIS — M21619 Bunion of unspecified foot: Secondary | ICD-10-CM

## 2024-10-11 DIAGNOSIS — M21611 Bunion of right foot: Secondary | ICD-10-CM | POA: Diagnosis not present

## 2024-10-12 ENCOUNTER — Ambulatory Visit (HOSPITAL_COMMUNITY): Admitting: Psychiatry

## 2024-10-12 NOTE — Progress Notes (Signed)
 Subjective:   Patient ID: Jane English, female   DOB: 53 y.o.   MRN: [id number]   HPI Patient states she is starting to feel somewhat better but still having a lot of pain in her foot and concerned about the bone neuro   ROS      Objective:  Physical Exam  Vascular status intact negative Toula' sign is noted wound edges is found to be within normal limits moderate edema beyond what we do expect but not severe and significantly reduced from last week when she was so concerned     Assessment:  Structural correction of right 1st and 5th MPJ which seems to be doing okay but has been a little more swelling than I was expecting     Plan:  8 NP precautionary x-ray taken and I reapplied sterile dressing instructed on the continuation of elevation compression and patient will be seen back to recheck again in approximately 2 weeks earlier if needed and can gradually start to wear surgical shoe or boot  X-ray dated today does indicate the osteotomy is staying in a very good position I am very pleased joint congruence

## 2024-10-13 ENCOUNTER — Other Ambulatory Visit: Payer: Self-pay | Admitting: Family Medicine

## 2024-10-13 NOTE — Telephone Encounter (Unsigned)
 Copied from CRM #[id number]. Topic: Clinical - Medication Refill >> Oct 13, 2024  2:18 PM Selinda RAMAN wrote: Medication: tirzepatide  (MOUNJARO ) 5 MG/0.5ML Pen   Has the patient contacted their pharmacy? No   This is the patient's preferred pharmacy:    [id number] [place] - [address] AT [facility] [address] [facility] [place] [address] Phone: [phone] Fax: [phone]  Is this the correct pharmacy for this prescription? Yes If no, delete pharmacy and type the correct one.   Has the prescription been filled recently? Yes  Is the patient out of the medication? Yes  Has the patient been seen for an appointment in the last year OR does the patient have an upcoming appointment? Yes  Can we respond through MyChart? Yes  The patient had surgery up in [place]  and forgot to bring her meds with her and this is why she need this medication by Friday if possible. Please assist patient further

## 2024-10-14 ENCOUNTER — Other Ambulatory Visit: Payer: Self-pay | Admitting: Family Medicine

## 2024-10-14 ENCOUNTER — Telehealth: Payer: Self-pay | Admitting: Dermatology

## 2024-10-14 ENCOUNTER — Telehealth: Payer: Self-pay | Admitting: Podiatry

## 2024-10-14 NOTE — Telephone Encounter (Unsigned)
 Copied from CRM #[id number]. Topic: Clinical - Medication Refill >> Oct 14, 2024  9:52 AM Hadassah PARAS wrote: Medication: tirzepatide  (MOUNJARO ) 5 MG/0.5ML Pen   Has the patient contacted their pharmacy? Yes (Agent: If no, request that the patient contact the pharmacy for the refill. If patient does not wish to contact the pharmacy document the reason why and proceed with request.) (Agent: If yes, when and what did the pharmacy advise?)  This is the patient's preferred pharmacy:    [id number] [place] - [id number] AT [facility] [address] [place] [place] [address] Phone: [phone] Fax: [phone]  Is this the correct pharmacy for this prescription? Yes If no, delete pharmacy and type the correct one.   Has the prescription been filled recently? No  Is the patient out of the medication? Yes  Has the patient been seen for an appointment in the last year OR does the patient have an upcoming appointment? Yes  Can we respond through MyChart? Yes  Agent: Please be advised that Rx refills may take up to 3 business days. We ask that you follow-up with your pharmacy.

## 2024-10-14 NOTE — Telephone Encounter (Signed)
 Patient called answering service, noted swelling and discoloration of her 1st and 5th toe I discussed with her a bruised appearance and edema is expected this soon after surgery she is only 9 days out from her bunionectomy.  [place] it has been throbbing and painful advised her to continue her pain medications as needed continue to ice and elevate the foot, she says she is currently staying in [place]  with her son and will not be back until mid January and to return to see us  sooner if she continues to have issues or seek care locally if needed

## 2024-10-14 NOTE — Telephone Encounter (Signed)
 CMA has returned pt's call.  Pt stated at her recent OV a topical from [facility] was discussed for her to mix with the recommended Eucerin RT to apply to the face. Pt stated that a coupon for Eucerin RT was provided but she has not received medication from [facility].   I have reviewed the 09/20/24 OV and see notes of periorbital PIH and volume loss with the following assessment/plan:  Exam: Darkness under eyes primarily due to volume loss and vascular congestion. Minimal pigment present. Discussed treatment options including caffeine  serum and retinol eye cream to improve appearance over time.  Tx: - Use caffeine  serum and Rock Retinol Eye Cream morning and night. - Apply Eastern Radiant Tone serum to address pigment. - Purchase additional products from [facility] or online as needed.   Please advise. I'm assuming pt may be referring to the Medrock melasmic cream. Ok for script if appropriate?

## 2024-10-14 NOTE — Telephone Encounter (Signed)
 Pt states she is returning a call to [employer]. Call back number: [phone] or [phone].

## 2024-10-18 NOTE — Telephone Encounter (Signed)
 Please reach out to pt and confirm that she's referring to the melasmic cream

## 2024-10-19 ENCOUNTER — Encounter: Attending: Physical Medicine and Rehabilitation | Admitting: Physical Medicine and Rehabilitation

## 2024-10-19 DIAGNOSIS — M961 Postlaminectomy syndrome, not elsewhere classified: Secondary | ICD-10-CM | POA: Diagnosis not present

## 2024-10-19 DIAGNOSIS — M5416 Radiculopathy, lumbar region: Secondary | ICD-10-CM

## 2024-10-19 DIAGNOSIS — G8929 Other chronic pain: Secondary | ICD-10-CM | POA: Insufficient documentation

## 2024-10-19 DIAGNOSIS — M48062 Spinal stenosis, lumbar region with neurogenic claudication: Secondary | ICD-10-CM | POA: Insufficient documentation

## 2024-10-19 DIAGNOSIS — M79641 Pain in right hand: Secondary | ICD-10-CM | POA: Diagnosis not present

## 2024-10-19 DIAGNOSIS — G894 Chronic pain syndrome: Secondary | ICD-10-CM | POA: Diagnosis not present

## 2024-10-19 DIAGNOSIS — M25562 Pain in left knee: Secondary | ICD-10-CM | POA: Insufficient documentation

## 2024-10-19 DIAGNOSIS — M79671 Pain in right foot: Secondary | ICD-10-CM | POA: Insufficient documentation

## 2024-10-19 DIAGNOSIS — M25561 Pain in right knee: Secondary | ICD-10-CM | POA: Diagnosis not present

## 2024-10-19 DIAGNOSIS — M79642 Pain in left hand: Secondary | ICD-10-CM

## 2024-10-19 MED ORDER — AMITRIPTYLINE HCL 25 MG PO TABS: 25.0000 mg | ORAL_TABLET | Freq: Every day | ORAL | 3 refills | Status: AC

## 2024-10-19 MED ORDER — OXYCODONE HCL 5 MG PO TABS: 10.0000 mg | ORAL_TABLET | Freq: Two times a day (BID) | ORAL | 0 refills | Status: DC
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
## 2024-10-19 NOTE — Telephone Encounter (Signed)
 Per verbal conversation with DO. She stated that pt should follow regimen as instructed below. Melasmic cream isn't Rx for periorbital PIH.   Pt has been informed and expressed understanding. She requested that CMA send a photo via MyChart of packaging.   MyChart sent.

## 2024-10-19 NOTE — Addendum Note (Signed)
 Addended by: LORILEE SVEN SQUIBB on: 10/19/2024 09:35 AM   Modules accepted: Orders

## 2024-10-19 NOTE — Addendum Note (Signed)
 Addended by: LORILEE SVEN SQUIBB on: 10/19/2024 09:39 AM   Modules accepted: Orders

## 2024-10-20 ENCOUNTER — Ambulatory Visit: Admitting: Allergy

## 2024-10-21 ENCOUNTER — Other Ambulatory Visit: Payer: Self-pay | Admitting: Family Medicine

## 2024-10-21 ENCOUNTER — Other Ambulatory Visit: Payer: Self-pay

## 2024-10-21 ENCOUNTER — Telehealth: Payer: Self-pay

## 2024-10-21 MED ORDER — TIRZEPATIDE 5 MG/0.5ML ~~LOC~~ SOAJ: 5.0000 mg | SUBCUTANEOUS | 2 refills | Status: DC

## 2024-10-21 NOTE — Progress Notes (Signed)
 Mounjaro  is ordered

## 2024-10-21 NOTE — Telephone Encounter (Signed)
 Copied from CRM #[id number]. Topic: Clinical - Prescription Issue >> Oct 21, 2024  9:37 AM Jane English wrote: tirzepatide  (MOUNJARO ) 5 MG/0.5ML Pen Pt need prescription transfered to the pharmacy listed below as she is out of town for foot surgery  [id number] [place] - [phone] AT [facility]  [address] [place] [address] Phone: [phone]

## 2024-10-24 ENCOUNTER — Other Ambulatory Visit: Payer: Self-pay | Admitting: Family Medicine

## 2024-10-25 ENCOUNTER — Encounter

## 2024-10-26 ENCOUNTER — Telehealth: Payer: Self-pay | Admitting: Family Medicine

## 2024-10-26 ENCOUNTER — Other Ambulatory Visit: Payer: Self-pay

## 2024-10-26 ENCOUNTER — Ambulatory Visit (HOSPITAL_COMMUNITY): Admitting: Psychiatry

## 2024-10-26 MED ORDER — TIRZEPATIDE 5 MG/0.5ML ~~LOC~~ SOAJ: 5.0000 mg | SUBCUTANEOUS | 2 refills | Status: AC

## 2024-10-26 NOTE — Telephone Encounter (Signed)
 Spoke to pt.

## 2024-10-26 NOTE — Telephone Encounter (Signed)
 Patient wanting to get a message to Dr Antonetta the medicines that are being sent to our office saying Dr Antonetta is not signing off on her medications, Per Patient, Dr Antonetta can reach out to her.

## 2024-10-29 ENCOUNTER — Other Ambulatory Visit: Payer: Self-pay | Admitting: Family Medicine

## 2024-11-02 ENCOUNTER — Ambulatory Visit: Admitting: Physician Assistant

## 2024-11-03 ENCOUNTER — Other Ambulatory Visit (HOSPITAL_COMMUNITY): Payer: Self-pay

## 2024-11-03 ENCOUNTER — Other Ambulatory Visit: Payer: Self-pay

## 2024-11-07 ENCOUNTER — Other Ambulatory Visit: Payer: Self-pay | Admitting: Allergy & Immunology

## 2024-11-08 ENCOUNTER — Other Ambulatory Visit (HOSPITAL_COMMUNITY): Payer: Self-pay

## 2024-11-10 ENCOUNTER — Other Ambulatory Visit (HOSPITAL_COMMUNITY): Payer: Self-pay

## 2024-11-10 ENCOUNTER — Other Ambulatory Visit: Payer: Self-pay

## 2024-11-10 NOTE — Progress Notes (Signed)
 Specialty Pharmacy Refill Coordination Note  Jane English is a 54 y.o. female contacted today regarding refills of specialty medication(s) Benralizumab  (Fasenra  Pen, FASENRA )   Patient requested Delivery   Delivery date: 11/12/24   Verified address: [address]  [place] [address]   Medication will be filled on: 11/11/24

## 2024-11-11 ENCOUNTER — Other Ambulatory Visit: Payer: Self-pay

## 2024-11-15 ENCOUNTER — Ambulatory Visit (INDEPENDENT_AMBULATORY_CARE_PROVIDER_SITE_OTHER)

## 2024-11-15 ENCOUNTER — Encounter: Payer: Self-pay | Admitting: Podiatry

## 2024-11-15 ENCOUNTER — Ambulatory Visit: Admitting: Podiatry

## 2024-11-15 ENCOUNTER — Ambulatory Visit: Payer: Medicare HMO

## 2024-11-15 DIAGNOSIS — M21619 Bunion of unspecified foot: Secondary | ICD-10-CM

## 2024-11-15 DIAGNOSIS — M21611 Bunion of right foot: Secondary | ICD-10-CM | POA: Diagnosis not present

## 2024-11-15 NOTE — Progress Notes (Unsigned)
 Patient presents for post-op visit today, POV #3 DOS 10/05/2024 RT [place] BUNIONECTOMY, RT 5TH METATARSAL OSTEOTOMY  Doing okay. Clemens again, while visiting son. This was before Christmas. I did hit the foot.Aside from that, I cannot get my tennis shoe on the foot. The bottom of my foot is sore and feels like something is sticking me. Swelling in the big toe and little toe..  Notes: n/a  Vital Signs: Today's Vitals   11/15/24 1339  PainSc: 9   PainLoc: Foot      Radiographs: [x]  Taken []  Not taken  Surgical Site Assessment:  - Dressing:  []  Minimal dry blood, intact []  Reinforced   []  Changed     -Notes: n/a  - Incision:  [x]  CDI (clean, dry, intact)  [x]  Mild erythema  []  Drainage noted   -Notes: n/a  - Swelling:  []  None  [x]  Mild  []  Moderate   []  Significant     -Notes: n/a  - Bruising:  [x]  None  []  Present: n/a   - Sutures/Staples:  []  None [x] Absorbable Intact  [x]  Removed Today  []  Plan to remove at next visit   -Cast/Splint/Pins: [x]  None []  Intact []  Removed Today []  Plan to remove at next visit []  Replaced  -Signs of infection:  [x]  None  []  Present - Describe: n/a  -DME:    []  None []  AFW [x]  Surgical shoe []  Cast  []  Splint  -Walking status:  [x]  Full WB  []  Partial WB  []  NWB  -Utilizing device:  [x]  None []  Knee Scooter []  Crutches []  Wheelchair    DVT assessment:  [x]  Denies symptoms []  Chest pain/SOB []  Pain in calf/redness/warmth   Redressed DSD and ace wrap. Educated on signs of infection, proper dressing care, pain management, and weight bearing status. Patient will contact provider with any new or worsening symptoms. The provider assessed the patient today and reviewed instructions regarding plan of care.

## 2024-11-16 ENCOUNTER — Encounter: Payer: Self-pay | Admitting: Adult Health

## 2024-11-16 ENCOUNTER — Encounter: Admitting: Physical Medicine and Rehabilitation

## 2024-11-16 ENCOUNTER — Ambulatory Visit: Admitting: Adult Health

## 2024-11-16 VITALS — BP 140/82 | HR 99 | Temp 98.6°F | Ht 66.0 in | Wt 179.8 lb

## 2024-11-16 DIAGNOSIS — J309 Allergic rhinitis, unspecified: Secondary | ICD-10-CM | POA: Diagnosis not present

## 2024-11-16 DIAGNOSIS — J4550 Severe persistent asthma, uncomplicated: Secondary | ICD-10-CM

## 2024-11-16 DIAGNOSIS — J310 Chronic rhinitis: Secondary | ICD-10-CM

## 2024-11-16 DIAGNOSIS — J4540 Moderate persistent asthma, uncomplicated: Secondary | ICD-10-CM

## 2024-11-16 MED ORDER — ALBUTEROL SULFATE HFA 108 (90 BASE) MCG/ACT IN AERS: 1.0000 | INHALATION_SPRAY | Freq: Four times a day (QID) | RESPIRATORY_TRACT | 4 refills | Status: AC | PRN

## 2024-11-16 NOTE — Patient Instructions (Addendum)
 Saline nasals spray/rinse Twice daily   Flonase  1 puff daily  Zyrtec  5mg  At bedtime  -(low dose)  Singulair  10mg  At bedtime   Stop Breo  Continue on Trelegy 1 puff daily  Continue on Fasenra   Albuterol  inhaler or neb As needed   Activity as tolerated.  Follow up with Dr. Jude  or Alexandera Kuntzman NP  in 4 months and As needed

## 2024-11-16 NOTE — Progress Notes (Signed)
 Subjective:   Patient ID: Jane English, female   DOB: 54 y.o.   MRN: [id number]   HPI Patient seen by nurse chronic foot problems right   ROS      Objective:  Physical Exam  Neurovascular check by nurse structurally everything looks good     Assessment:  Doing well I reviewed her x-ray indicating osteotomies are healing well good alignment fixation and I have recommended the continuation of anti-inflammatories compression and gradual increase in activity levels over the next 6 weeks     Plan:  Above reviewed with patient

## 2024-11-16 NOTE — Progress Notes (Signed)
 "  @Patient  ID: Jane English, female    DOB: [birth year], 54 y.o.   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  History of Present Illness Jane English is a 54 year old female with asthma who presents for a check-up.  She has been experiencing persistent asthma symptoms despite medication adjustments. She was switched from Breo to Trelegy in August but has been using both inhalers due to a misunderstanding. She also uses albuterol  as needed for rescue. She experiences significant shortness of breath with walking and is particularly sensitive to smoke, which exacerbates her symptoms. She has recently been started on Fasenra  injections for two months to help with asthma flares. She is also on Zyrtec , Flonase , and Singulair  for allergies.  A CT chest 12/2023  showed clear lungs and no blood clots.   She recently underwent foot surgery where pins and screws were placed in her toes due to a broken bone and bone spurs. She is currently in pain and unable to wear her regular shoes, requiring mesh shoes and a surgical shoe.       Allergies[1]  Immunization History  Administered Date(s) Administered   Hepb-cpg 07/01/2024, 09/03/2024   Influenza Split 09/03/2012   Influenza Whole 07/28/2007, 08/11/2009, 07/19/2010   Influenza, Mdck, Trivalent,PF 6+ MOS(egg free) 06/08/2024    Influenza, Seasonal, Injecte, Preservative Fre 07/25/2023, 09/02/2023, 07/30/2024   Influenza,inj,Quad PF,6+ Mos 09/13/2013, 08/12/2014, 08/01/2015, 07/24/2016, 08/14/2017, 08/12/2018, 08/10/2019, 07/11/2020, 06/20/2022, 08/29/2022   Influenza-Unspecified 05/24/2018, 08/29/2022   Moderna Covid-19 Fall Seasonal Vaccine 67yrs & older 09/02/2023, 09/03/2024   Moderna SARS-COV2 Booster Vaccination 08/29/2022   Moderna Sars-Covid-2 Vaccination 07/21/2020, 08/14/2020, 03/12/2021   PNEUMOCOCCAL CONJUGATE-20 03/27/2022, 06/08/2024   PPD Test 02/22/2011, 06/26/2011, 09/18/2012, 11/29/2013, 12/06/2014, 12/09/2017, 01/15/2022, 02/01/2022   Pneumococcal Polysaccharide-23 05/14/2021   Td 10/03/2005   Tdap 12/09/2017, 09/07/2022   Zoster Recombinant(Shingrix) 06/27/2021, 03/27/2022    Past Medical History:  Diagnosis Date   Anemia    Asthma    Asthma flare 04/09/2013   Back pain    Bronchitis    Chronic abdominal pain    Chronic constipation    Constipation due to opioid therapy    Depression    Depression, major, single episode, severe (HCC) 10/03/2018   PHQ 9 score of 15   Diabetes mellitus without complication (HCC)    Diabetes mellitus, type II (HCC)    DVT (deep venous thrombosis) (HCC) 2010   GERD (gastroesophageal reflux disease)    Heart murmur    no cardiologist   Helicobacter pylori gastritis 06/11/2013   Colonoscopy Dr. Albertus   Hypertension    IBS (irritable bowel syndrome)    Migraine headache    Neuropathy    Obesity    Obsessive-compulsive disorder    PSYCHOTIC D/O W/HALLUCINATIONS CONDS CLASS ELSW 03/04/2010   Qualifier: Diagnosis of  By:  Antonetta MD, Rollene     PTSD (post-traumatic stress disorder)    SBO (small bowel obstruction) (HCC) 08/09/2013   Seasonal allergies 12/10/2012   Seizures (HCC)    Shortness of breath     Tobacco History: Tobacco Use History[2] Counseling given: Not Answered   Outpatient Medications Prior to Visit  Medication Sig Dispense Refill    albuterol  (PROVENTIL ) (2.5 MG/3ML) 0.083% nebulizer solution Take 3 mLs (2.5 mg total) by nebulization every 6 (six) hours as needed for wheezing or shortness of breath. 150 mL 1   albuterol  (VENTOLIN  HFA) 108 (90 Base) MCG/ACT inhaler TAKE 2 PUFFS BY MOUTH EVERY 6 HOURS AS NEEDED FOR WHEEZE OR SHORTNESS OF BREATH 18 each 4   amitriptyline  (ELAVIL ) 25 MG tablet Take 1 tablet (25 mg total) by mouth at bedtime. 90 tablet 3   azelastine  (ASTELIN ) 0.1 % nasal spray Place 1-2 sprays into both nostrils 2 (two) times daily as needed (nasal drainage). Use in each nostril as directed 30 mL 5   benralizumab  (FASENRA  PEN) 30 MG/ML prefilled autoinjector Inject 1 mL (30 mg total) into the skin every 28 (twenty-eight) days. 1 mL 1   benralizumab  (FASENRA  PEN) 30 MG/ML prefilled autoinjector Inject 1 mL (30 mg total) into the skin every 8 (eight) weeks. 1 mL 6   benzoyl peroxide  5 % external liquid Apply topically 2 (two) times daily. 142 g 12   cetirizine  (ZYRTEC ) 10 MG tablet TAKE 1 TABLET BY MOUTH EVERY DAY 90 tablet 1   cloNIDine  (CATAPRES ) 0.1 MG tablet TAKE 1 TABLET BY MOUTH EVERYDAY AT BEDTIME 90 tablet 1   clotrimazole -betamethasone  (LOTRISONE ) cream Apply twice daily for 5 days to affected area(s) then as needed 45 g 1   cromolyn  (OPTICROM ) 4 % ophthalmic solution Place 1 drop into both eyes 4 (four) times daily. 10 mL 12   diclofenac  Sodium (VOLTAREN ) 1 % GEL APPLY 2 GRAMS TO AFFECTED AREA 4 TIMES A DAY 100 g 4   Efinaconazole  (JUBLIA ) 10 % SOLN Apply to affected nails once daily. 8 mL 11   Fluocinolone  Acetonide Body 0.01 % OIL APPLY 1 APPLICATION TOPICALLY EVERY OTHER DAY. 118.28 mL 6   FLUoxetine  (PROZAC ) 10 MG capsule Take 1 capsule (10 mg total) by mouth daily. Take with Prozac  20 mg to total 30 mg daily 90 capsule 1   FLUoxetine  (PROZAC ) 20 MG capsule Take 1 capsule (20 mg total) by mouth daily. Take with Prozac  10 mg to total 30 mg daily 90 capsule 1   fluticasone  (FLONASE ) 50 MCG/ACT nasal spray  PLACE 1-2 SPRAYS INTO BOTH NOSTRILS DAILY. 48 mL 1   Fluticasone -Umeclidin-Vilant (TRELEGY ELLIPTA ) 200-62.5-25 MCG/ACT AEPB Inhale 1 puff into the lungs daily. 1 each 5   hydrOXYzine  (VISTARIL ) 25 MG capsule Take 1 capsule (25 mg total) by mouth 3 (three) times daily as needed for anxiety. 270 capsule 0   ibuprofen  (ADVIL ) 800 MG tablet TAKE 1 TAB BY MOUTH TWICE DAILY AS NEEDED , FOR SEVERE HEADACHE, MAXIMUM USE ITS TWO TIMES PER WEEK 30 tablet 0   ipratropium (ATROVENT ) 0.03 % nasal spray Place 2 sprays into both nostrils every 12 (twelve) hours. 30 mL 0   lidocaine  (LIDODERM ) 5 % Place 1 patch onto the skin daily. Remove & Discard patch within 12 hours or as directed by MD 30 patch 0   lidocaine  (XYLOCAINE ) 2 % jelly      linaclotide  (LINZESS ) 290 MCG CAPS capsule TAKE 1 CAPSULE (290 MCG TOTAL) BY MOUTH DAILY BEFORE BREAKFAST.  90 capsule 0   lubiprostone  (AMITIZA ) 24 MCG capsule TAKE 1 CAPSULE BY MOUTH TWICE A DAY WITH MEALS 180 capsule 0   magnesium  oxide (MAG-OX) 400 MG tablet Take 1 tablet (400 mg total) by mouth 2 (two) times daily. 90 tablet 3   montelukast  (SINGULAIR ) 10 MG tablet TAKE 1 TABLET BY MOUTH EVERYDAY AT BEDTIME 30 tablet 5   Na Sulfate-K Sulfate-Mg Sulf 17.5-3.13-1.6 GM/177ML SOLN Take as directed 354 mL 0   NARCAN  4 MG/0.1ML LIQD nasal spray kit Place 4 mg into the nose as directed.  1   NONFORMULARY OR COMPOUNDED ITEM Apply 1-2 pumps, 3-4 times daily as needed. 1 each 0   olmesartan -hydrochlorothiazide  (BENICAR  HCT) 20-12.5 MG tablet TAKE 1 TABLET BY MOUTH EVERY DAY 90 tablet 1   ondansetron  (ZOFRAN ) 4 MG tablet Take 1 tablet (4 mg total) by mouth every 8 (eight) hours as needed for nausea or vomiting. 20 tablet 0   ondansetron  (ZOFRAN ) 4 MG tablet Take 1 tablet (4 mg total) by mouth every 8 (eight) hours as needed for nausea or vomiting. 20 tablet 0   oxybutynin  (DITROPAN -XL) 10 MG 24 hr tablet TAKE 1 TABLET BY MOUTH EVERYDAY AT BEDTIME 90 tablet 2   pantoprazole  (PROTONIX )  40 MG tablet TAKE 1 TABLET BY MOUTH EVERY DAY 90 tablet 1   potassium chloride  20 MEQ/15ML (10%) SOLN Take 15 mLs (20 mEq total) by mouth 2 (two) times daily. 473 mL 1   rosuvastatin  (CRESTOR ) 5 MG tablet TAKE 1 TABLET (5 MG TOTAL) BY MOUTH DAILY. 90 tablet 1   Safety Seal Miscellaneous MISC Minoxidil 8% Finasteride 0.01% solution - apply to the affected areas of the scalp QAM. 20 mL 5   solifenacin  (VESICARE ) 5 MG tablet TAKE 1 TABLET (5 MG TOTAL) BY MOUTH DAILY. 90 tablet 1   terbinafine  (LAMISIL ) 250 MG tablet Take 1 tablet (250 mg total) by mouth daily. 42 tablet 0   tirzepatide  (MOUNJARO ) 5 MG/0.5ML Pen Inject 5 mg into the skin once a week. 2 mL 2   topiramate  (TOPAMAX ) 25 MG tablet TAKE 1 TABLET BY MOUTH TWICE A DAY 180 tablet 3   trolamine salicylate (ASPERCREME) 10 % cream Apply 1 application topically as needed for muscle pain.      Facility-Administered Medications Prior to Visit  Medication Dose Route Frequency Provider Last Rate Last Admin   benralizumab  (FASENRA ) prefilled syringe 30 mg  30 mg Subcutaneous Q28 days Luke Orlan HERO, DO   30 mg at 07/28/24 1157     Review of Systems:   Constitutional:   No  weight loss, night sweats,  Fevers, chills, +fatigue, or  lassitude.  HEENT:   No headaches,  Difficulty swallowing,  Tooth/dental problems, or  Sore throat,                No sneezing, itching, ear ache, +nasal congestion, post nasal drip,   CV:  No chest pain,  Orthopnea, PND, swelling in lower extremities, anasarca, dizziness, palpitations, syncope.   GI  No heartburn, indigestion, abdominal pain, nausea, vomiting, diarrhea, change in bowel habits, loss of appetite, bloody stools.   Resp:  No chest wall deformity  Skin: no rash or lesions.  GU: no dysuria, change in color of urine, no urgency or frequency.  No flank pain, no hematuria   MS:  No joint pain or swelling.  No decreased range of motion.  No back pain.    Physical Exam  BP (!) 140/82 (BP Location: Left  Arm, Patient Position: Sitting, Cuff Size: Normal) Comment: patient is having pain from foot surgery  Pulse (!) 113   Temp 98.6 F (37 C) (Oral)   Ht 5' 6 (1.676 m)   Wt 179 lb 12.8 oz (81.6 kg)   LMP 01/14/2019 Comment: tubal ligation  SpO2 97%   BMI 29.02 kg/m   GEN: A/Ox3; pleasant , NAD, well nourished    HEENT:  /AT,  NOSE-clear, THROAT-clear, no lesions, no postnasal drip or exudate noted.   NECK:  Supple w/ fair ROM; no JVD; normal carotid impulses w/o bruits; no thyromegaly or nodules palpated; no lymphadenopathy.    RESP  Clear  P & A; w/o, wheezes/ rales/ or rhonchi. no accessory muscle use, no dullness to percussion  CARD:  RRR, no m/r/g, no peripheral edema, pulses intact, no cyanosis or clubbing.  GI:   Soft & nt; nml bowel sounds; no organomegaly or masses detected.   Musco: Warm bil, no deformities or joint swelling noted.   Neuro: alert, no focal deficits noted.    Skin: Warm, no lesions or rashes    Lab Results:Reviewed 11/16/2024   CBC    Component Value Date/Time   WBC 5.3 05/20/2024 1502   WBC 6.2 12/22/2023 1506   RBC 4.29 05/20/2024 1502   RBC 3.71 (L) 12/22/2023 1506   HGB 13.3 05/20/2024 1502   HCT 39.4 05/20/2024 1502   PLT 285 05/20/2024 1502   MCV 92 05/20/2024 1502   MCH 31.0 05/20/2024 1502   MCH 32.1 12/22/2023 1506   MCHC 33.8 05/20/2024 1502   MCHC 34.1 12/22/2023 1506   RDW 12.4 05/20/2024 1502   LYMPHSABS 1.9 05/20/2024 1502   MONOABS 0.6 06/05/2022 2045   EOSABS 0.6 (H) 05/20/2024 1502   BASOSABS 0.1 05/20/2024 1502    BMET    Component Value Date/Time   NA 142 07/01/2024 1138   K 3.8 07/01/2024 1138   CL 100 07/01/2024 1138   CO2 25 07/01/2024 1138   GLUCOSE 102 (H) 07/01/2024 1138   GLUCOSE 114 (H) 12/22/2023 1506   BUN 12 07/01/2024 1138   CREATININE 1.00 07/01/2024 1138   CREATININE 1.03 12/06/2019 1508   CALCIUM  9.5 07/01/2024 1138   GFRNONAA >60 12/22/2023 1506   GFRNONAA 63 12/06/2019 1508   GFRAA 71  04/06/2020 1630   GFRAA 73 12/06/2019 1508    BNP No results found for: BNP  ProBNP No results found for: PROBNP  Imaging: DG Foot 2 Views Right Result Date: 11/15/2024 Please see detailed radiograph report in office note.   triamcinolone  acetonide (KENALOG ) 10 MG/ML injection 10 mg     Date Action Dose Route User   09/20/2024 1448 Given 10 mg Intra-articular Regal, Norman S, DPM          Latest Ref Rng & Units 06/23/2024    9:36 AM  PFT Results  FVC-Pre L 2.30   FVC-Predicted Pre % 61   FVC-Post L 2.32   FVC-Predicted Post % 62   Pre FEV1/FVC % % 77   Post FEV1/FCV % % 78   FEV1-Pre L 1.76   FEV1-Predicted Pre % 60   FEV1-Post L 1.82     No results found for: NITRICOXIDE      No data to display              Assessment & Plan:   Assessment and Plan Assessment & Plan Moderate persistent asthma  -with ongoing symptom  Asthma symptoms persist with dyspnea on exertion and  tightness , smoke triggers.  Previous CT chest showed clear lungs. Fasenra  was started two months ago, .  Discontinue Breo. Continue Trelegy and Fasenra  every four weeks. Refill albuterol  prescription.  Continue on trigger prevention.  Allow Fasenra  little bit more time to see if this will help with symptom management.  Continue on Singulair  daily.  Daily Zyrtec  and Flonase .  Allergic rhinitis  -stable on current regimen Symptoms persist despite management with Zyrtec , Flonase , and Singulair . Continue Zyrtec  5 mg daily, Flonase  nasal spray, and Singulair  at bedtime. Use saline spray as needed.    Plan  Patient Instructions  Saline nasals spray/rinse Twice daily   Flonase  1 puff daily  Zyrtec  5mg  At bedtime  -(low dose)  Singulair  10mg  At bedtime   Stop Breo  Continue on Trelegy 1 puff daily  Continue on Fasenra   Albuterol  inhaler or neb As needed   Activity as tolerated.  Follow up with Dr. Jude  or Parker Wherley NP  in 4 months and As needed       Madelin Stank,  NP 11/16/2024  I spent   30 minutes dedicated to the care of this patient on the date of this encounter to include pre-visit review of records, face-to-face time with the patient discussing conditions above, post visit ordering of testing, clinical documentation with the electronic health record, making appropriate referrals as documented, and communicating necessary findings to members of the patients care team.      [1]  Allergies Allergen Reactions   Neurontin [Gabapentin] Nausea And Vomiting and Other (See Comments)    Sleep walking/hallucinations    Penicillins Hives, Shortness Of Breath and Other (See Comments)    Has patient had a PCN reaction causing immediate rash, facial/tongue/throat swelling, SOB or lightheadedness with hypotension: Yes Has patient had a PCN reaction causing severe rash involving mucus membranes or skin necrosis: No Has patient had a PCN reaction that required hospitalization: No Has patient had a PCN reaction occurring within the last 10 years: Yes If all of the above answers are NO, then may proceed with Cephalosporin use.   Pregabalin Anaphylaxis, Shortness Of Breath, Diarrhea and Swelling    Lyrica   Tramadol  Other (See Comments)    Makes patient delusional   Latex Rash  [2]  Social History Tobacco Use  Smoking Status Never   Passive exposure: Never  Smokeless Tobacco Never   "

## 2024-11-17 ENCOUNTER — Ambulatory Visit (HOSPITAL_COMMUNITY): Admitting: Psychiatry

## 2024-11-19 ENCOUNTER — Ambulatory Visit: Admitting: Family Medicine

## 2024-11-23 ENCOUNTER — Encounter: Payer: Self-pay | Admitting: Family Medicine

## 2024-11-23 ENCOUNTER — Other Ambulatory Visit (HOSPITAL_COMMUNITY): Payer: Self-pay | Admitting: Family Medicine

## 2024-11-23 ENCOUNTER — Ambulatory Visit: Admitting: Family Medicine

## 2024-11-23 VITALS — BP 146/87 | HR 100 | Resp 16 | Ht 66.0 in | Wt 178.0 lb

## 2024-11-23 DIAGNOSIS — M961 Postlaminectomy syndrome, not elsewhere classified: Secondary | ICD-10-CM

## 2024-11-23 DIAGNOSIS — E559 Vitamin D deficiency, unspecified: Secondary | ICD-10-CM

## 2024-11-23 DIAGNOSIS — R52 Pain, unspecified: Secondary | ICD-10-CM

## 2024-11-23 DIAGNOSIS — E785 Hyperlipidemia, unspecified: Secondary | ICD-10-CM | POA: Diagnosis not present

## 2024-11-23 DIAGNOSIS — Z9189 Other specified personal risk factors, not elsewhere classified: Secondary | ICD-10-CM | POA: Diagnosis not present

## 2024-11-23 DIAGNOSIS — J324 Chronic pansinusitis: Secondary | ICD-10-CM | POA: Diagnosis not present

## 2024-11-23 DIAGNOSIS — I10 Essential (primary) hypertension: Secondary | ICD-10-CM

## 2024-11-23 DIAGNOSIS — E663 Overweight: Secondary | ICD-10-CM | POA: Diagnosis not present

## 2024-11-23 DIAGNOSIS — E1169 Type 2 diabetes mellitus with other specified complication: Secondary | ICD-10-CM | POA: Diagnosis not present

## 2024-11-23 DIAGNOSIS — Z1231 Encounter for screening mammogram for malignant neoplasm of breast: Secondary | ICD-10-CM

## 2024-11-23 MED ORDER — KETOROLAC TROMETHAMINE 60 MG/2ML IM SOLN
60.0000 mg | Freq: Once | INTRAMUSCULAR | Status: AC
  Administered 2024-11-23: 60 mg via INTRAMUSCULAR

## 2024-11-23 NOTE — Progress Notes (Unsigned)
 "  Follow Up Note  RE: CORALYNN English MRN: [id number] DOB: 1971/06/18 Date of Office Visit: 11/24/2024  Referring provider: Antonetta Rollene BRAVO, MD Primary care provider: Antonetta Rollene BRAVO, MD  Chief Complaint: No chief complaint on file.  History of Present Illness: I had the pleasure of seeing Jane English for a [facility]/20[facility]. She is a 54 y.o. female, who is being followed for allergic rhinitis, asthma, recurrent infections. Her previous allergy  office visit was on 07/28/2024 with Jane English. Today is a regular follow up visit.  Discussed the use of AI scribe software for clinical note transcription with the patient, who gave verbal consent to proceed.  History of Present Illness            Did she start fasenra ?  Assessment and Plan: Jane English is a 54 y.o. female with: Seasonal and perennial allergic rhinitis Past history - 2025 labs positive to cat, dog, trees.  Interim history - still having symptoms. Never got dymista .  Continue environmental control measures. Use over the counter antihistamines such as Zyrtec  (cetirizine ), Claritin  (loratadine ), Allegra (fexofenadine), or Xyzal  (levocetirizine) daily as needed. May take twice a day during allergy  flares. May switch antihistamines every few months. Use Flonase  (fluticasone ) nasal spray 1-2 sprays per nostril once a day as needed for nasal congestion.  Use azelastine  nasal spray 1-2 sprays per nostril twice a day as needed for runny nose/drainage. Nasal saline spray (i.e., Simply Saline) or nasal saline lavage (i.e., NeilMed) is recommended as needed and prior to medicated nasal sprays. Recommend allergy  injections - asthma must be more stable before starting.  2 injections.  Handout given.   Not well controlled moderate persistent asthma Follows with pulm and recently had switched to Trelegy in August with no benefit. Patient is wanting to start biologics. Uses albuterol  at  least 1-2 times per day. Today's spirometry showed some mild obstruction. Continue follow up with pulmonology and their plan. Start Fasenra  injections per patient request - sample given today.  Tammy will be in touch with you regarding coverage.  Consent was signed.  Daily controller medication(s): continue Trelegy 200mcg 1 puff once a day and rinse mouth after each use.  Continue Singulair  (montelukast ) 10mg  daily at night. May use albuterol  rescue inhaler 2 puffs or nebulizer every 4 to 6 hours as needed for shortness of breath, chest tightness, coughing, and wheezing. May use albuterol  rescue inhaler 2 puffs 5 to 15 minutes prior to strenuous physical activities.   Recurrent infections She is not sure if she got the pneumonia shot. Reviewed EMR records and unable to find recent pneumovax vaccination.  Keep track of infections and antibiotics use. Find out if you got your pneumonia shot or not.  If you did not - please get the pneumovax 23  - prescription given. Assessment and Plan              No follow-ups on file.  No orders of the defined types were placed in this encounter.  Lab Orders  No laboratory test(s) ordered today    Diagnostics: Spirometry:  Tracings reviewed. Her effort: {Blank single:19197::Good reproducible efforts.,It was hard to get consistent efforts and there is a question as to whether this reflects a maximal maneuver.,Poor effort, data can not be interpreted.} FVC: ***L FEV1: ***L, ***% predicted FEV1/FVC ratio: ***% Interpretation: {Blank single:19197::Spirometry consistent with mild obstructive disease,Spirometry consistent with moderate obstructive disease,Spirometry consistent with severe obstructive disease,Spirometry consistent with possible restrictive  disease,Spirometry consistent with mixed obstructive and restrictive disease,Spirometry uninterpretable due to technique,Spirometry consistent with normal pattern,No overt  abnormalities noted given today's efforts}.  Please see scanned spirometry results for details.  Skin Testing: {Blank single:19197::Select foods,Environmental allergy  panel,Environmental allergy  panel and select foods,Food allergy  panel,None,Deferred due to recent antihistamines use}. *** Results discussed with patient/family.   Medication List:  Current Outpatient Medications  Medication Sig Dispense Refill   albuterol  (PROVENTIL ) (2.5 MG/3ML) 0.083% nebulizer solution Take 3 mLs (2.5 mg total) by nebulization every 6 (six) hours as needed for wheezing or shortness of breath. 150 mL 1   albuterol  (VENTOLIN  HFA) 108 (90 Base) MCG/ACT inhaler Inhale 1-2 puffs into the lungs every 6 (six) hours as needed for wheezing or shortness of breath. 1 each 4   amitriptyline  (ELAVIL ) 25 MG tablet Take 1 tablet (25 mg total) by mouth at bedtime. 90 tablet 3   azelastine  (ASTELIN ) 0.1 % nasal spray Place 1-2 sprays into both nostrils 2 (two) times daily as needed (nasal drainage). Use in each nostril as directed 30 mL 5   benralizumab  (FASENRA  PEN) 30 MG/ML prefilled autoinjector Inject 1 mL (30 mg total) into the skin every 28 (twenty-eight) days. 1 mL 1   benralizumab  (FASENRA  PEN) 30 MG/ML prefilled autoinjector Inject 1 mL (30 mg total) into the skin every 8 (eight) weeks. 1 mL 6   benzoyl peroxide  5 % external liquid Apply topically 2 (two) times daily. 142 g 12   cetirizine  (ZYRTEC ) 10 MG tablet TAKE 1 TABLET BY MOUTH EVERY DAY 90 tablet 1   cloNIDine  (CATAPRES ) 0.1 MG tablet TAKE 1 TABLET BY MOUTH EVERYDAY AT BEDTIME 90 tablet 1   clotrimazole -betamethasone  (LOTRISONE ) cream Apply twice daily for 5 days to affected area(s) then as needed 45 g 1   cromolyn  (OPTICROM ) 4 % ophthalmic solution Place 1 drop into both eyes 4 (four) times daily. 10 mL 12   diclofenac  Sodium (VOLTAREN ) 1 % GEL APPLY 2 GRAMS TO AFFECTED AREA 4 TIMES A DAY 100 g 4   Efinaconazole  (JUBLIA ) 10 % SOLN  Apply to affected nails once daily. 8 mL 11   Fluocinolone  Acetonide Body 0.01 % OIL APPLY 1 APPLICATION TOPICALLY EVERY OTHER DAY. 118.28 mL 6   FLUoxetine  (PROZAC ) 10 MG capsule Take 1 capsule (10 mg total) by mouth daily. Take with Prozac  20 mg to total 30 mg daily 90 capsule 1   FLUoxetine  (PROZAC ) 20 MG capsule Take 1 capsule (20 mg total) by mouth daily. Take with Prozac  10 mg to total 30 mg daily 90 capsule 1   fluticasone  (FLONASE ) 50 MCG/ACT nasal spray PLACE 1-2 SPRAYS INTO BOTH NOSTRILS DAILY. 48 mL 1   Fluticasone -Umeclidin-Vilant (TRELEGY ELLIPTA ) 200-62.5-25 MCG/ACT AEPB Inhale 1 puff into the lungs daily. 1 each 5   hydrOXYzine  (VISTARIL ) 25 MG capsule Take 1 capsule (25 mg total) by mouth 3 (three) times daily as needed for anxiety. 270 capsule 0   ibuprofen  (ADVIL ) 800 MG tablet TAKE 1 TAB BY MOUTH TWICE DAILY AS NEEDED , FOR SEVERE HEADACHE, MAXIMUM USE ITS TWO TIMES PER WEEK 30 tablet 0   ipratropium (ATROVENT ) 0.03 % nasal spray Place 2 sprays into both nostrils every 12 (twelve) hours. 30 mL 0   lidocaine  (LIDODERM ) 5 % Place 1 patch onto the skin daily. Remove & Discard patch within 12 hours or as directed by MD 30 patch 0   lidocaine  (XYLOCAINE ) 2 % jelly      linaclotide  (LINZESS ) 290 MCG CAPS capsule TAKE  1 CAPSULE (290 MCG TOTAL) BY MOUTH DAILY BEFORE BREAKFAST. 90 capsule 0   lubiprostone  (AMITIZA ) 24 MCG capsule TAKE 1 CAPSULE BY MOUTH TWICE A DAY WITH MEALS 180 capsule 0   magnesium  oxide (MAG-OX) 400 MG tablet Take 1 tablet (400 mg total) by mouth 2 (two) times daily. 90 tablet 3   montelukast  (SINGULAIR ) 10 MG tablet TAKE 1 TABLET BY MOUTH EVERYDAY AT BEDTIME 30 tablet 5   Na Sulfate-K Sulfate-Mg Sulf 17.5-3.13-1.6 GM/177ML SOLN Take as directed 354 mL 0   NARCAN  4 MG/0.1ML LIQD nasal spray kit Place 4 mg into the nose as directed.  1   NONFORMULARY OR COMPOUNDED ITEM Apply 1-2 pumps, 3-4 times daily as needed. 1 each 0    olmesartan -hydrochlorothiazide  (BENICAR  HCT) 20-12.5 MG tablet TAKE 1 TABLET BY MOUTH EVERY DAY 90 tablet 1   ondansetron  (ZOFRAN ) 4 MG tablet Take 1 tablet (4 mg total) by mouth every 8 (eight) hours as needed for nausea or vomiting. 20 tablet 0   ondansetron  (ZOFRAN ) 4 MG tablet Take 1 tablet (4 mg total) by mouth every 8 (eight) hours as needed for nausea or vomiting. 20 tablet 0   oxybutynin  (DITROPAN -XL) 10 MG 24 hr tablet TAKE 1 TABLET BY MOUTH EVERYDAY AT BEDTIME 90 tablet 2   pantoprazole  (PROTONIX ) 40 MG tablet TAKE 1 TABLET BY MOUTH EVERY DAY 90 tablet 1   potassium chloride  20 MEQ/15ML (10%) SOLN Take 15 mLs (20 mEq total) by mouth 2 (two) times daily. 473 mL 1   rosuvastatin  (CRESTOR ) 5 MG tablet TAKE 1 TABLET (5 MG TOTAL) BY MOUTH DAILY. 90 tablet 1   Safety Seal Miscellaneous MISC Minoxidil 8% Finasteride 0.01% solution - apply to the affected areas of the scalp QAM. 20 mL 5   solifenacin  (VESICARE ) 5 MG tablet TAKE 1 TABLET (5 MG TOTAL) BY MOUTH DAILY. 90 tablet 1   terbinafine  (LAMISIL ) 250 MG tablet Take 1 tablet (250 mg total) by mouth daily. 42 tablet 0   tirzepatide  (MOUNJARO ) 5 MG/0.5ML Pen Inject 5 mg into the skin once a week. 2 mL 2   topiramate  (TOPAMAX ) 25 MG tablet TAKE 1 TABLET BY MOUTH TWICE A DAY 180 tablet 3   trolamine salicylate (ASPERCREME) 10 % cream Apply 1 application topically as needed for muscle pain.      Current Facility-Administered Medications  Medication Dose Route Frequency Provider Last Rate Last Admin   benralizumab  (FASENRA ) prefilled syringe 30 mg  30 mg Subcutaneous Q28 days Jane English Orlan HERO, DO   30 mg at 07/28/24 1157   Allergies: Allergies[1] I reviewed her past medical history, social history, family history, and environmental history and no significant changes have been reported from her previous visit.  Review of Systems  Constitutional:  Negative for appetite change, chills, fever and unexpected weight change.  HENT:  Positive  for congestion and rhinorrhea.   Eyes:  Negative for itching.  Respiratory:  Positive for cough, chest tightness, shortness of breath and wheezing.   Cardiovascular:  Negative for chest pain.  Gastrointestinal:  Negative for abdominal pain.  Genitourinary:  Negative for difficulty urinating.  Skin:  Negative for rash.  Allergic/Immunologic: Positive for environmental allergies.  Neurological:  Negative for headaches.    Objective: LMP 01/14/2019 Comment: tubal ligation There is no height or weight on file to calculate BMI. Physical Exam Vitals and nursing note reviewed.  Constitutional:      Appearance: Normal appearance. She is well-developed.  HENT:     Head: Normocephalic  and atraumatic.     Right Ear: Tympanic membrane and external ear normal.     Left Ear: Tympanic membrane and external ear normal.     Nose: Nose normal.     Mouth/Throat:     Mouth: Mucous membranes are moist.     Pharynx: Oropharynx is clear.  Eyes:     Conjunctiva/sclera: Conjunctivae normal.  Cardiovascular:     Rate and Rhythm: Normal rate and regular rhythm.     Heart sounds: Normal heart sounds. No murmur heard.    No friction rub. No gallop.  Pulmonary:     Effort: Pulmonary effort is normal.     Breath sounds: Normal breath sounds. No wheezing, rhonchi or rales.  Musculoskeletal:     Cervical back: Neck supple.  Skin:    General: Skin is warm.     Findings: No rash.  Neurological:     Mental Status: She is alert and oriented to person, place, and time.  Psychiatric:        Behavior: Behavior normal.   Previous notes and tests were reviewed. The plan was reviewed with the patient/family, and all questions/concerned were addressed.  It was my pleasure to see Jane English today and participate in her care. Please feel free to contact me with any questions or concerns.  Sincerely,  Orlan Cramp, DO Allergy  & Immunology  [facility]  [place] office:  [phone] [facility] office: [phone]     [1] Allergies Allergen Reactions   Neurontin [Gabapentin] Nausea And Vomiting and Other (See Comments)    Sleep walking/hallucinations    Penicillins Hives, Shortness Of Breath and Other (See Comments)    Has patient had a PCN reaction causing immediate rash, facial/tongue/throat swelling, SOB or lightheadedness with hypotension: Yes Has patient had a PCN reaction causing severe rash involving mucus membranes or skin necrosis: No Has patient had a PCN reaction that required hospitalization: No Has patient had a PCN reaction occurring within the last 10 years: Yes If all of the above answers are NO, then may proceed with Cephalosporin use.   Pregabalin Anaphylaxis, Shortness Of Breath, Diarrhea and Swelling    Lyrica   Tramadol  Other (See Comments)    Makes patient delusional   Latex Rash  "

## 2024-11-23 NOTE — Patient Instructions (Addendum)
" °  Annual exam in 3 months  Colonoscopy screen due 10/2026 ( per GI)  Please schedule mammogram due June 7 or after at checkout   You are referred for scan of sinuses    Please give handicap sticker at checkout  Pt reports will need electric chair due to foot and back pain which she finds debilitating, Jane English  cBC, lipid, cmp and eGFr, TSH , HBA1C and vit D today  Thanks for choosing [facility], we consider it a privelige to serve you.  "

## 2024-11-23 NOTE — Assessment & Plan Note (Signed)
 Ongo`ing sinus pressure with uncontrolled drainage, rept scan needed x 1 year

## 2024-11-24 ENCOUNTER — Other Ambulatory Visit: Payer: Self-pay

## 2024-11-24 ENCOUNTER — Ambulatory Visit: Admitting: Allergy

## 2024-11-24 ENCOUNTER — Ambulatory Visit: Admitting: Internal Medicine

## 2024-11-24 ENCOUNTER — Encounter: Payer: Self-pay | Admitting: Allergy

## 2024-11-24 ENCOUNTER — Encounter: Payer: Self-pay | Admitting: Internal Medicine

## 2024-11-24 ENCOUNTER — Ambulatory Visit: Payer: Self-pay | Admitting: Family Medicine

## 2024-11-24 VITALS — BP 122/82 | HR 98 | Ht 66.0 in | Wt 177.5 lb

## 2024-11-24 VITALS — BP 122/82 | HR 97 | Temp 97.9°F | Resp 20 | Ht 66.0 in | Wt 178.2 lb

## 2024-11-24 DIAGNOSIS — K219 Gastro-esophageal reflux disease without esophagitis: Secondary | ICD-10-CM | POA: Diagnosis not present

## 2024-11-24 DIAGNOSIS — J302 Other seasonal allergic rhinitis: Secondary | ICD-10-CM

## 2024-11-24 DIAGNOSIS — R1013 Epigastric pain: Secondary | ICD-10-CM | POA: Diagnosis not present

## 2024-11-24 DIAGNOSIS — K5904 Chronic idiopathic constipation: Secondary | ICD-10-CM

## 2024-11-24 DIAGNOSIS — J4550 Severe persistent asthma, uncomplicated: Secondary | ICD-10-CM | POA: Diagnosis not present

## 2024-11-24 DIAGNOSIS — K581 Irritable bowel syndrome with constipation: Secondary | ICD-10-CM

## 2024-11-24 DIAGNOSIS — Z860100 Personal history of colon polyps, unspecified: Secondary | ICD-10-CM | POA: Diagnosis not present

## 2024-11-24 DIAGNOSIS — B999 Unspecified infectious disease: Secondary | ICD-10-CM

## 2024-11-24 DIAGNOSIS — J3089 Other allergic rhinitis: Secondary | ICD-10-CM | POA: Diagnosis not present

## 2024-11-24 LAB — LIPID PANEL
Chol/HDL Ratio: 2 ratio (ref 0.0–4.4)
Cholesterol, Total: 159 mg/dL (ref 100–199)
HDL: 79 mg/dL
LDL Chol Calc (NIH): 65 mg/dL (ref 0–99)
Triglycerides: 80 mg/dL (ref 0–149)
VLDL Cholesterol Cal: 15 mg/dL (ref 5–40)

## 2024-11-24 LAB — CMP14+EGFR
ALT: 28 IU/L (ref 0–32)
AST: 20 IU/L (ref 0–40)
Albumin: 4.6 g/dL (ref 3.8–4.9)
Alkaline Phosphatase: 93 IU/L (ref 49–135)
BUN/Creatinine Ratio: 17 (ref 9–23)
BUN: 15 mg/dL (ref 6–24)
Bilirubin Total: 0.3 mg/dL (ref 0.0–1.2)
CO2: 23 mmol/L (ref 20–29)
Calcium: 9.3 mg/dL (ref 8.7–10.2)
Chloride: 103 mmol/L (ref 96–106)
Creatinine, Ser: 0.88 mg/dL (ref 0.57–1.00)
Globulin, Total: 2.7 g/dL (ref 1.5–4.5)
Glucose: 91 mg/dL (ref 70–99)
Potassium: 3.9 mmol/L (ref 3.5–5.2)
Sodium: 142 mmol/L (ref 134–144)
Total Protein: 7.3 g/dL (ref 6.0–8.5)
eGFR: 78 mL/min/1.73

## 2024-11-24 LAB — CBC WITH DIFFERENTIAL/PLATELET
Basophils Absolute: 0 x10E3/uL (ref 0.0–0.2)
Basos: 0 %
EOS (ABSOLUTE): 0 x10E3/uL (ref 0.0–0.4)
Eos: 0 %
Hematocrit: 38.3 % (ref 34.0–46.6)
Hemoglobin: 12.7 g/dL (ref 11.1–15.9)
Immature Grans (Abs): 0 x10E3/uL (ref 0.0–0.1)
Immature Granulocytes: 0 %
Lymphocytes Absolute: 1.7 x10E3/uL (ref 0.7–3.1)
Lymphs: 34 %
MCH: 30.4 pg (ref 26.6–33.0)
MCHC: 33.2 g/dL (ref 31.5–35.7)
MCV: 92 fL (ref 79–97)
Monocytes Absolute: 0.5 x10E3/uL (ref 0.1–0.9)
Monocytes: 10 %
Neutrophils Absolute: 2.8 x10E3/uL (ref 1.4–7.0)
Neutrophils: 56 %
Platelets: 291 x10E3/uL (ref 150–450)
RBC: 4.18 x10E6/uL (ref 3.77–5.28)
RDW: 14 % (ref 11.7–15.4)
WBC: 5 x10E3/uL (ref 3.4–10.8)

## 2024-11-24 LAB — TSH: TSH: 0.771 u[IU]/mL (ref 0.450–4.500)

## 2024-11-24 LAB — HEMOGLOBIN A1C
Est. average glucose Bld gHb Est-mCnc: 120 mg/dL
Hgb A1c MFr Bld: 5.8 % — ABNORMAL HIGH (ref 4.8–5.6)

## 2024-11-24 LAB — VITAMIN D 25 HYDROXY (VIT D DEFICIENCY, FRACTURES): Vit D, 25-Hydroxy: 23 ng/mL — ABNORMAL LOW (ref 30.0–100.0)

## 2024-11-24 MED ORDER — AZELASTINE HCL 0.1 % NA SOLN: 1.0000 | Freq: Two times a day (BID) | NASAL | 5 refills | Status: AC | PRN

## 2024-11-24 MED ORDER — LUBIPROSTONE 24 MCG PO CAPS: ORAL_CAPSULE | ORAL | 3 refills | Status: AC

## 2024-11-24 MED ORDER — LEVOCETIRIZINE DIHYDROCHLORIDE 5 MG PO TABS: 5.0000 mg | ORAL_TABLET | Freq: Every evening | ORAL | 3 refills | Status: AC

## 2024-11-24 MED ORDER — LINACLOTIDE 290 MCG PO CAPS: ORAL_CAPSULE | ORAL | 3 refills | Status: AC

## 2024-11-24 MED ORDER — PANTOPRAZOLE SODIUM 40 MG PO TBEC: 40.0000 mg | DELAYED_RELEASE_TABLET | Freq: Every day | ORAL | 3 refills | Status: AC

## 2024-11-24 MED ORDER — MONTELUKAST SODIUM 10 MG PO TABS: 10.0000 mg | ORAL_TABLET | Freq: Every day | ORAL | 3 refills | Status: AC

## 2024-11-24 MED ORDER — FLUTICASONE PROPIONATE 50 MCG/ACT NA SUSP: 1.0000 | Freq: Every day | NASAL | 5 refills | Status: AC | PRN

## 2024-11-24 MED ORDER — TRELEGY ELLIPTA 200-62.5-25 MCG/ACT IN AEPB: 1.0000 | INHALATION_SPRAY | Freq: Every day | RESPIRATORY_TRACT | 5 refills | Status: AC

## 2024-11-24 MED ORDER — OLOPATADINE HCL 0.2 % OP SOLN: 1.0000 [drp] | Freq: Every day | OPHTHALMIC | 5 refills | Status: AC | PRN

## 2024-11-24 NOTE — Patient Instructions (Addendum)
 Environmental allergies Continue environmental control measures. Start Xyzal  (levocetirizine) daily and may take twice a day if needed. Use Flonase  (fluticasone ) nasal spray 1-2 sprays per nostril once a day as needed for nasal congestion.  Use azelastine  nasal spray 1-2 sprays per nostril twice a day as needed for runny nose/drainage. Nasal saline spray (i.e., Simply Saline) or nasal saline lavage (i.e., NeilMed) is recommended as needed and prior to medicated nasal sprays. Use olopatadine  eye drops 0.2% once a day as needed for itchy/watery eyes. Recommend allergy  injections - asthma must be more stable before starting.   Asthma  Continue follow up with pulmonology.  Start Xolair injections every 2 weeks.  This will replace Fasenra .  First 3 must be given in the office. Tammy will be in touch with you regarding coverage.   Daily controller medication(s): continue Trelegy 200mcg 1 puff once a day and rinse mouth after each use.  Continue Singulair  (montelukast ) 10mg  daily at night. May use albuterol  rescue inhaler 2 puffs or nebulizer every 4 to 6 hours as needed for shortness of breath, chest tightness, coughing, and wheezing. May use albuterol  rescue inhaler 2 puffs 5 to 15 minutes prior to strenuous physical activities. Monitor frequency of use - if you need to use it more than twice per week on a consistent basis let us  know.  Breathing control goals:  Full participation in all desired activities (may need albuterol  before activity) Albuterol  use two times or less a week on average (not counting use with activity) Cough interfering with sleep two times or less a month Oral steroids no more than once a year No hospitalizations   Infections Keep track of infections and antibiotics use. Get bloodwork to look at response to the pneumonia shot. We are ordering labs, so please allow 1-2 weeks for the results to come back. With the newly implemented Cures Act, the labs might be visible  to you at the same time that they become visible to me. However, I will not address the results until all of the results are back, so please be patient.  In the meantime, continue recommendations in your patient instructions, including avoidance measures (if applicable), until you hear from me.  Follow up in 3 months or sooner if needed.

## 2024-11-24 NOTE — Patient Instructions (Signed)
 We have sent the following medications to your pharmacy for you to pick up at your convenience:Linzess , Amitiza  and pantoprazole .   _______________________________________________________  If your blood pressure at your visit was 140/90 or greater, please contact your primary care physician to follow up on this.  _______________________________________________________  If you are age 56 or older, your body mass index should be between 23-30. Your Body mass index is 28.65 kg/m. If this is out of the aforementioned range listed, please consider follow up with your Primary Care Provider.  If you are age 80 or younger, your body mass index should be between 19-25. Your Body mass index is 28.65 kg/m. If this is out of the aformentioned range listed, please consider follow up with your Primary Care Provider.   ________________________________________________________  The [facility] providers would like to encourage you to use MYCHART to communicate with providers for non-urgent requests or questions.  Due to long hold times on the telephone, sending your provider a message by [facility] may be a faster and more efficient way to get a response.  Please allow 48 business hours for a response.  Please remember that this is for non-urgent requests.  _______________________________________________________  [facility] is using a team-based approach to care.  Your team is made up of your doctor and two to three APPS. Our APPS (Nurse Practitioners and Physician Assistants) work with your physician to ensure care continuity for you. They are fully qualified to address your health concerns and develop a treatment plan. They communicate directly with your gastroenterologist to care for you. Seeing the Advanced Practice Practitioners on your physician's team can help you by facilitating care more promptly, often allowing for earlier appointments, access to diagnostic testing, procedures, and other  specialty referrals.

## 2024-11-24 NOTE — Progress Notes (Signed)
 "  Subjective:    Patient ID: Jane English, female    DOB: Feb 28, 1971, 54 y.o.   MRN: [id number]  HPI Jane English is a 54 year old female with chronic idiopathic constipation, type 2 diabetes mellitus, and gastroesophageal reflux disease who presents for follow-up of chronic constipation.  Chronic constipation persists, with intermittent bad and good days regarding bowel movements. Symptom management includes Linzess  290 mcg once daily and Amitiza  24 mcg twice daily, both covered by insurance. Miralax  is used as needed, especially after consuming cheese. She reported abnormal sensations prior to her last colonoscopy.  Gastroesophageal reflux disease remains well controlled on pantoprazole  once daily. Previous trials of other acid suppressants were less effective, specifically lansoprazole . She denies nausea and reports good symptom control with the current regimen.  Type 2 diabetes management is ongoing, with recent weight fluctuations following foot surgery. Most recent A1c is 5.8, improved from 6.0. Laboratory studies show improved cholesterol (total cholesterol 159, LDL 65) and triglycerides. Liver enzymes are within normal limits, with a recent slight elevation still considered normal. Kidney function and thyroid  studies are normal. She has low vitamin D , previously discontinued supplementation due to high levels, and is considering restarting.  Recent foot surgery for a fractured big toe required placement of two pins and screws. She experiences swelling and cold sensitivity in the foot. Hardware is also present in her back.  Review of Systems As per HPI, otherwise negative  Current Medications, Allergies, Past Medical History, Past Surgical History, Family History and Social History were reviewed in [employer] record.    Objective:   Physical Exam BP 122/82   Pulse 98   Ht 5' 6 (1.676 m)   Wt 177 lb 8 oz (80.5 kg)   LMP 01/14/2019 Comment: tubal ligation   SpO2 97%   BMI 28.65 kg/m  Gen: awake, alert, NAD HEENT: anicteric  Ext: no c/c/e Neuro: nonfocal     Latest Ref Rng & Units 11/23/2024   10:37 AM 05/20/2024    3:02 PM 12/22/2023    3:06 PM  CBC  WBC 3.4 - 10.8 x10E3/uL 5.0  5.3  6.2   Hemoglobin 11.1 - 15.9 g/dL 87.2  86.6  88.0   Hematocrit 34.0 - 46.6 % 38.3  39.4  34.9   Platelets 150 - 450 x10E3/uL 291  285  249    CMP     Component Value Date/Time   NA 142 11/23/2024 1037   K 3.9 11/23/2024 1037   CL 103 11/23/2024 1037   CO2 23 11/23/2024 1037   GLUCOSE 91 11/23/2024 1037   GLUCOSE 114 (H) 12/22/2023 1506   BUN 15 11/23/2024 1037   CREATININE 0.88 11/23/2024 1037   CREATININE 1.03 12/06/2019 1508   CALCIUM  9.3 11/23/2024 1037   PROT 7.3 11/23/2024 1037   ALBUMIN  4.6 11/23/2024 1037   AST 20 11/23/2024 1037   ALT 28 11/23/2024 1037   ALKPHOS 93 11/23/2024 1037   BILITOT 0.3 11/23/2024 1037   GFR 87.40 04/23/2013 1416   EGFR 78 11/23/2024 1037   GFRNONAA >60 12/22/2023 1506   GFRNONAA 63 12/06/2019 1508   Lab Results  Component Value Date   HGBA1C 5.8 (H) 11/23/2024   HGBA1C 6.0 (H) 07/01/2024   HGBA1C 5.9 (H) 12/17/2023       Assessment & Plan:   Chronic idiopathic constipation/IBS overlap/history of multiple abdominal surgeries with adhesive disease --clinically doing well and current therapy effective however requiring 2 prescription laxatives.  Still occasional  MiraLAX  needed --Continue Linzess  290 mcg daily --Continue Amitiza  24 mcg twice daily --Continue as needed MiraLAX  17 g once to twice daily  2.  GERD/dyspepsia --trial of lansoprazole  less effective.  Current symptoms well-managed.  No dysphagia or dyne aphasia.  No nausea or vomiting. --Continue pantoprazole  40 mg daily  3.  History of colonic polyps --surveillance colonoscopy recommended December 2027  She can follow-up in 1 to 2 years, sooner if needed  20 minutes total spent today including patient facing time, coordination of care,  reviewing medical history/procedures/pertinent radiology studies, and documentation of the encounter.  "

## 2024-11-25 ENCOUNTER — Telehealth: Payer: Self-pay

## 2024-11-25 ENCOUNTER — Encounter: Admitting: Physical Medicine and Rehabilitation

## 2024-11-25 NOTE — Addendum Note (Signed)
 Addended by: OTHA MADELIN HERO on: 11/25/2024 08:28 AM   Modules accepted: Orders

## 2024-11-26 ENCOUNTER — Telehealth: Payer: Self-pay

## 2024-11-26 ENCOUNTER — Other Ambulatory Visit (HOSPITAL_COMMUNITY): Payer: Self-pay

## 2024-11-26 NOTE — Telephone Encounter (Signed)
*  [facility] Spec  Pharmacy Patient Advocate Encounter   Received notification from Pt Calls Messages that prior authorization for Xolair 252mg  syringe is required/requested.   Insurance verification completed.   The patient is insured through [employer].   Per test claim: PA required; PA submitted to above mentioned insurance via Latent Key/confirmation #/EOC [id number] Status is pending

## 2024-11-28 ENCOUNTER — Other Ambulatory Visit: Payer: Self-pay | Admitting: Family Medicine

## 2024-11-28 DIAGNOSIS — R52 Pain, unspecified: Secondary | ICD-10-CM | POA: Insufficient documentation

## 2024-11-28 DIAGNOSIS — Z9189 Other specified personal risk factors, not elsewhere classified: Secondary | ICD-10-CM | POA: Insufficient documentation

## 2024-11-28 NOTE — Assessment & Plan Note (Signed)
" °  Patient re-educated about  the importance of commitment to a  minimum of 150 minutes of exercise per week as able.  The importance of healthy food choices with portion control discussed, as well as eating regularly and within a 12 hour window most days. The need to choose clean , green food 50 to 75% of the time is discussed, as well as to make water the primary drink and set a goal of 64 ounces water daily.       11/24/2024   11:21 AM 11/24/2024    8:28 AM 11/23/2024    9:32 AM  Weight /BMI  Weight 178 lb 3.2 oz 177 lb 8 oz 178 lb  Height 5' 6 (1.676 m) 5' 6 (1.676 m) 5' 6 (1.676 m)  BMI 28.76 kg/m2 28.65 kg/m2 28.73 kg/m2     "

## 2024-11-28 NOTE — Progress Notes (Signed)
 "  Jane English     MRN: [id number]      DOB: 1971/02/13  Chief Complaint  Patient presents with   Hypertension    4 month follow up     HPI Jane English is here for follow up and re-evaluation of chronic medical conditions, medication management and review of any available recent lab and radiology data.  Preventive health is updated, specifically  Cancer screening and Immunization.   C/o worse pain and debility following right foot surgery, affecting foot and back, stating that surgery was not what she expected, has pins in her foot which cause a lot of pain, requests handicap sticker due to limitation in mobility and also states that she is requesting an electric wheelchair for this same reason C/o chronic cough unchanged and continuous PND, seeing Pulmonary, has had 1 ENT eval , sinus scan at that time was abnormal, will repeat scan as problem persists C/o bilateral ear pressure / discomfort no drainage or hearing loss noted   ROS Denies recent fever or chills. . . Denies chest pains, palpitations , PND or orthopnea Denies abdominal pain, nausea, vomiting,diarrhea or constipation.   Denies dysuria, frequency, hesitancy or incontinence. . Chronic depression and  anxiety doing very well with current Therapist and Provider Denies skin break down or rash.   PE  BP (!) 146/87   Pulse 100   Resp 16   Ht 5' 6 (1.676 m)   Wt 178 lb (80.7 kg)   LMP 01/14/2019 Comment: tubal ligation  SpO2 96%   BMI 28.73 kg/m   Patient alert and oriented and in no cardiopulmonary distress.  HEENT: No facial asymmetry, EOMI,     Neck supple .Scant cerumen bilaterally, not impacted  Chest: Clear to auscultation bilaterally.  CVS: S1, S2 no murmurs, no S3.Regular rate.  ABD: Soft non tender.   Ext: No edema  MS: decreased  ROM spine,  adequate in shoulders, hips and knees.Decreased mobility of right foot Abnormal gait, unable to weight bear on foot  Skin: Intact, no ulcerations or rash  noted.  Psych: Good eye contact, normal affect. Memory intact not anxious or depressed appearing.  CNS: CN 2-12 intact, power,  normal throughout.no focal deficits noted.   Assessment & Plan Chronic sinusitis Ongo`ing sinus pressure with uncontrolled drainage, rept scan needed x 1 year  Type 2 diabetes mellitus with other specified complication (HCC) Diabetes associated with hypertension, hyperlipidemia, arthritis, and depression  Jane English is reminded of the importance of commitment to daily physical activity for 30 minutes or more, as able and the need to limit carbohydrate intake to 30 to 60 grams per meal to help with blood sugar control.   The need to take medication as prescribed, test blood sugar as directed, and to call between visits if there is a concern that blood sugar is uncontrolled is also discussed.   Jane English is reminded of the importance of daily foot exam, annual eye examination, and good blood sugar, blood pressure and cholesterol control.     Latest Ref Rng & Units 11/23/2024   10:37 AM 07/01/2024   11:38 AM 03/25/2024   10:44 AM 12/22/2023    3:06 PM 12/17/2023   10:36 AM  Diabetic Labs  HbA1c 4.8 - 5.6 % 5.8  6.0    5.9   Micro/Creat Ratio 0 - 29 mg/g creat     5   Chol 100 - 199 mg/dL 840   858     HDL >60 mg/dL  79   63     Calc LDL 0 - 99 mg/dL 65   64     Triglycerides 0 - 149 mg/dL 80   69     Creatinine 0.57 - 1.00 mg/dL 9.11  8.99  9.16  9.21  0.96       11/24/2024   11:21 AM 11/24/2024    8:28 AM 11/23/2024    9:32 AM 11/16/2024    3:12 PM 10/11/2024    1:17 PM 09/21/2024   10:53 AM 09/20/2024    2:16 PM  BP/Weight  Systolic BP 122 122 146 140  148   Diastolic BP 82 82 87 82  88   Wt. (Lbs) 178.2 177.5 178 179.8 173.4 173.4 162  BMI 28.76 kg/m2 28.65 kg/m2 28.73 kg/m2 29.02 kg/m2 27.99 kg/m2 27.99 kg/m2 26.15 kg/m2      Latest Ref Rng & Units 12/17/2023    9:40 AM 07/10/2023   12:00 AM  Foot/eye exam completion dates  Eye Exam No Retinopathy   No Retinopathy      Foot Form Completion  Done      This result is from an external source.        Pain Uncontrolled , generalized pain increased in right foot and lower back, Toradol  60 mg iM in office  Essential hypertension Elevated at visit,initially patient in pain, no med change , generally well controlled , re eval in range .  Postlaminectomy syndrome Chronic pain management through pain clinic  Overweight (BMI 25.0-29.9)  Patient re-educated about  the importance of commitment to a  minimum of 150 minutes of exercise per week as able.  The importance of healthy food choices with portion control discussed, as well as eating regularly and within a 12 hour window most days. The need to choose clean , green food 50 to 75% of the time is discussed, as well as to make water the primary drink and set a goal of 64 ounces water daily.       11/24/2024   11:21 AM 11/24/2024    8:28 AM 11/23/2024    9:32 AM  Weight /BMI  Weight 178 lb 3.2 oz 177 lb 8 oz 178 lb  Height 5' 6 (1.676 m) 5' 6 (1.676 m) 5' 6 (1.676 m)  BMI 28.76 kg/m2 28.65 kg/m2 28.73 kg/m2      At increased risk for cardiovascular disease Co morbidities place pt at increased risk obtain calcium  score   "

## 2024-11-28 NOTE — Assessment & Plan Note (Signed)
 Elevated at visit,initially patient in pain, no med change , generally well controlled , re eval in range .

## 2024-11-28 NOTE — Assessment & Plan Note (Signed)
Chronic pain management through pain clinic ?

## 2024-11-28 NOTE — Assessment & Plan Note (Signed)
 Co morbidities place pt at increased risk obtain calcium  score

## 2024-11-28 NOTE — Assessment & Plan Note (Signed)
 Uncontrolled , generalized pain increased in right foot and lower back, Toradol  60 mg iM in office

## 2024-11-28 NOTE — Assessment & Plan Note (Signed)
 Diabetes associated with hypertension, hyperlipidemia, arthritis, and depression  Jane English is reminded of the importance of commitment to daily physical activity for 30 minutes or more, as able and the need to limit carbohydrate intake to 30 to 60 grams per meal to help with blood sugar control.   The need to take medication as prescribed, test blood sugar as directed, and to call between visits if there is a concern that blood sugar is uncontrolled is also discussed.   Jane English is reminded of the importance of daily foot exam, annual eye examination, and good blood sugar, blood pressure and cholesterol control.     Latest Ref Rng & Units 11/23/2024   10:37 AM 07/01/2024   11:38 AM 03/25/2024   10:44 AM 12/22/2023    3:06 PM 12/17/2023   10:36 AM  Diabetic Labs  HbA1c 4.8 - 5.6 % 5.8  6.0    5.9   Micro/Creat Ratio 0 - 29 mg/g creat     5   Chol 100 - 199 mg/dL 840   858     HDL >60 mg/dL 79   63     Calc LDL 0 - 99 mg/dL 65   64     Triglycerides 0 - 149 mg/dL 80   69     Creatinine 0.57 - 1.00 mg/dL 9.11  8.99  9.16  9.21  0.96       11/24/2024   11:21 AM 11/24/2024    8:28 AM 11/23/2024    9:32 AM 11/16/2024    3:12 PM 10/11/2024    1:17 PM 09/21/2024   10:53 AM 09/20/2024    2:16 PM  BP/Weight  Systolic BP 122 122 146 140  148   Diastolic BP 82 82 87 82  88   Wt. (Lbs) 178.2 177.5 178 179.8 173.4 173.4 162  BMI 28.76 kg/m2 28.65 kg/m2 28.73 kg/m2 29.02 kg/m2 27.99 kg/m2 27.99 kg/m2 26.15 kg/m2      Latest Ref Rng & Units 12/17/2023    9:40 AM 07/10/2023   12:00 AM  Foot/eye exam completion dates  Eye Exam No Retinopathy  No Retinopathy      Foot Form Completion  Done      This result is from an external source.

## 2024-11-29 ENCOUNTER — Other Ambulatory Visit: Payer: Self-pay

## 2024-11-29 ENCOUNTER — Ambulatory Visit: Attending: Allergy

## 2024-11-29 ENCOUNTER — Encounter: Attending: Physical Medicine and Rehabilitation | Admitting: Physical Medicine and Rehabilitation

## 2024-11-29 ENCOUNTER — Other Ambulatory Visit (HOSPITAL_COMMUNITY): Payer: Self-pay

## 2024-11-29 DIAGNOSIS — M5416 Radiculopathy, lumbar region: Secondary | ICD-10-CM | POA: Insufficient documentation

## 2024-11-29 DIAGNOSIS — J4551 Severe persistent asthma with (acute) exacerbation: Secondary | ICD-10-CM

## 2024-11-29 DIAGNOSIS — M25561 Pain in right knee: Secondary | ICD-10-CM | POA: Insufficient documentation

## 2024-11-29 DIAGNOSIS — M48062 Spinal stenosis, lumbar region with neurogenic claudication: Secondary | ICD-10-CM | POA: Insufficient documentation

## 2024-11-29 DIAGNOSIS — M25562 Pain in left knee: Secondary | ICD-10-CM | POA: Insufficient documentation

## 2024-11-29 DIAGNOSIS — M961 Postlaminectomy syndrome, not elsewhere classified: Secondary | ICD-10-CM | POA: Insufficient documentation

## 2024-11-29 DIAGNOSIS — M79671 Pain in right foot: Secondary | ICD-10-CM | POA: Insufficient documentation

## 2024-11-29 DIAGNOSIS — G8929 Other chronic pain: Secondary | ICD-10-CM | POA: Insufficient documentation

## 2024-11-29 DIAGNOSIS — Z79899 Other long term (current) drug therapy: Secondary | ICD-10-CM

## 2024-11-29 DIAGNOSIS — G894 Chronic pain syndrome: Secondary | ICD-10-CM | POA: Insufficient documentation

## 2024-11-29 MED ORDER — OMALIZUMAB 150 MG/ML ~~LOC~~ SOSY
225.0000 mg | PREFILLED_SYRINGE | SUBCUTANEOUS | 11 refills | Status: AC
  Filled 2024-11-29: qty 2, 28d supply, fill #0

## 2024-11-29 MED ORDER — OMALIZUMAB 75 MG/0.5ML ~~LOC~~ SOSY
225.0000 mg | PREFILLED_SYRINGE | SUBCUTANEOUS | 11 refills | Status: AC
  Filled 2024-11-29: qty 1, 28d supply, fill #0

## 2024-11-29 NOTE — Progress Notes (Signed)
 Specialty Pharmacy Initiation Note  Patient counseled in telephone visit on 11/29/24   Jane English is a 54 y.o. female who will be followed by the specialty pharmacy service for RxSp Asthma/COPD    Review of administration, indication, effectiveness, safety, potential side effects, storage/disposable, and missed dose instructions occurred today for patient's specialty medication(s) Omalizumab  (XOLAIR )     Patient/Caregiver did not have any additional questions or concerns.   [name] therapy is appropriate to: Initiate    Goals Addressed             This Visit's Progress    Reduce disease symptoms including coughing and shortness of breath       Patient is changing therapy from Fasenra  to Xolair  due to lack of efficacy. Patient will maintain adherence and avoid flare triggers         Jane English Specialty Pharmacist

## 2024-11-29 NOTE — Progress Notes (Signed)
 Specialty Pharmacy Initial Fill Coordination Note  NOVA SCHMUHL is a 54 y.o. female contacted today regarding initial fill of specialty medication(s) Omalizumab  (XOLAIR )   Patient requested Courier to Provider Office   Delivery date: 12/02/24   Verified address: [address] [place], [place] [address]   Medication will be filled on: 12/01/24   Patient is aware of $0.00 copayment.

## 2024-11-29 NOTE — Telephone Encounter (Signed)
 Your request has been approved PA Case: [id number], Status: Approved, Coverage Starts on: 11/04/2024 12:00:00 AM, Coverage Ends on: 11/03/2025 12:00:00 AM. Questions? Contact [phone]. Authorization Expiration12/31/2026  Xolair  150mg  syringe and 75mg  syringe approved.

## 2024-11-29 NOTE — Telephone Encounter (Signed)
 Test claims $0.00/28 days

## 2024-11-29 NOTE — Progress Notes (Signed)
  Pharmacotherapy Clinic - New Start Biologic  Referring Provider: Orlan Cramp  Virtual Visit via Telephone Note  I connected with Jane English on 11/29/24 at 11:00 AM EST by telephone and verified that I am speaking with the correct person using two identifiers.  Location: Patient: home Provider: office   I discussed the limitations, risks, security and privacy concerns of performing an evaluation and management service by telephone and the availability of in person appointments. I also discussed with the patient that there may be a patient responsible charge related to this service. The patient expressed understanding and agreed to proceed.  HPI: Jane English is a 54 y.o. female who presents to the pharmacotherapy clinic via telephone for new start Xolair  initial education. Last OV with Orlan Cramp was on 11/24/24.   Indication: severe persistent asthma Dosing: 225mg  every 14 days  [name] current respiratory regimen: Trelegy , Singulair  10mg , Albuterol  rescue inhaler/nebulizer PRN  Patient Active Problem List   Diagnosis Date Noted   At increased risk for cardiovascular disease 11/28/2024   Pain 11/28/2024   Encounter for screening fecal occult blood testing 07/19/2024   Immunization due 07/15/2024   Breast tenderness 06/30/2024   Lymph node enlargement 06/30/2024   Chest pain 04/05/2024   Discolored nails 03/25/2024   Severe persistent asthma with acute exacerbation (HCC) 01/06/2024   Atypical squamous cell changes of undetermined significance (ASCUS) on cervical cytology with negative high risk human papilloma virus (HPV) test result 12/22/2023   Depression, major, single episode, moderate (HCC) 12/22/2023   GAD (generalized anxiety disorder) 12/22/2023   Dry eyes, bilateral 10/16/2023   Encounter for annual general medical examination with abnormal findings in adult 07/01/2023   Neck pain, bilateral 07/01/2023   Hyperlipidemia LDL goal <100 07/01/2023    Folliculitis 04/07/2023   Dermatitis 02/25/2023   Pruritus 02/25/2023   Primary osteoarthritis of left knee 08/01/2022   Acute lateral meniscus tear of left knee 07/05/2022   Type 2 diabetes mellitus with other specified complication (HCC) 03/21/2022   Anxiety 12/16/2020   Left foot pain 05/01/2020   Great toe pain, left 05/01/2020   Back spasm 04/10/2020   Urinary urgency 08/11/2019   Overweight (BMI 25.0-29.9) 04/26/2019   Anxiety and depression 04/26/2019   Arthralgia of left shoulder region 11/05/2018   Depression, major, single episode, severe (HCC) 10/03/2018   Vitamin D  deficiency 12/14/2017   Bilateral ankle pain 04/16/2017   Amenorrhea 10/15/2016   Paresthesia 05/06/2016   Back pain with radiation 04/04/2016   Obesity (BMI 30.0-34.9) 03/25/2016   Neuropathic pain of finger of right hand 12/31/2015   Pap smear of cervix shows high risk HPV present 08/08/2015   Chronic sinusitis 05/28/2015   Primary osteoarthritis of right knee 03/17/2015   Cyst of left ovary 02/06/2015   Anovulation 02/06/2015   Secondary amenorrhea 02/06/2015   Intractable chronic migraine without aura and without status migrainosus 12/21/2014   Migraine 09/21/2014   Heart murmur 05/13/2014   Abdominal adhesions 02/15/2014   Anemia 10/11/2013   H. pylori duodenitis 07/07/2013   PTSD (post-traumatic stress disorder) 03/26/2013   Insomnia 03/18/2013   GERD (gastroesophageal reflux disease) 10/29/2012   Chronic pain syndrome 10/06/2012   Postlaminectomy syndrome 10/06/2012   Onychomycosis 09/02/2012   Cervical neck pain with evidence of disc disease 04/13/2012   Goiter 06/07/2011   Allergic rhinitis 04/17/2010   Cough variant asthma 03/04/2010   Essential hypertension 11/27/2007    [name] Medications  New Prescriptions   OMALIZUMAB  (XOLAIR ) 150  MG/ML PREFILLED SYRINGE    Inject 225 mg into the skin every 14 (fourteen) days. Inject 150mg  with 75mg  for total 225mg  dose.   OMALIZUMAB  (XOLAIR )  75 MG/0.5ML PREFILLED SYRINGE    Inject 225 mg into the skin every 14 (fourteen) days. Inject 75mg  with 150mg  for total 225mg  dose.  Previous Medications   ALBUTEROL  (PROVENTIL ) (2.5 MG/3ML) 0.083% NEBULIZER SOLUTION    Take 3 mLs (2.5 mg total) by nebulization every 6 (six) hours as needed for wheezing or shortness of breath.   ALBUTEROL  (VENTOLIN  HFA) 108 (90 BASE) MCG/ACT INHALER    Inhale 1-2 puffs into the lungs every 6 (six) hours as needed for wheezing or shortness of breath.   AMITRIPTYLINE  (ELAVIL ) 25 MG TABLET    Take 1 tablet (25 mg total) by mouth at bedtime.   AZELASTINE  (ASTELIN ) 0.1 % NASAL SPRAY    Place 1-2 sprays into both nostrils 2 (two) times daily as needed (nasal drainage). Use in each nostril as directed   BENRALIZUMAB  (FASENRA  PEN) 30 MG/ML PREFILLED AUTOINJECTOR    Inject 1 mL (30 mg total) into the skin every 28 (twenty-eight) days.   BENRALIZUMAB  (FASENRA  PEN) 30 MG/ML PREFILLED AUTOINJECTOR    Inject 1 mL (30 mg total) into the skin every 8 (eight) weeks.   BENZOYL PEROXIDE  5 % EXTERNAL LIQUID    Apply topically 2 (two) times daily.   CLONIDINE  (CATAPRES ) 0.1 MG TABLET    TAKE 1 TABLET BY MOUTH EVERYDAY AT BEDTIME   CLOTRIMAZOLE -BETAMETHASONE  (LOTRISONE ) CREAM    Apply twice daily for 5 days to affected area(s) then as needed   DICLOFENAC  SODIUM (VOLTAREN ) 1 % GEL    APPLY 2 GRAMS TO AFFECTED AREA 4 TIMES A DAY   EFINACONAZOLE  (JUBLIA ) 10 % SOLN    Apply to affected nails once daily.   FLUOCINOLONE  ACETONIDE BODY 0.01 % OIL    APPLY 1 APPLICATION TOPICALLY EVERY OTHER DAY.   FLUOXETINE  (PROZAC ) 10 MG CAPSULE    Take 1 capsule (10 mg total) by mouth daily. Take with Prozac  20 mg to total 30 mg daily   FLUOXETINE  (PROZAC ) 20 MG CAPSULE    Take 1 capsule (20 mg total) by mouth daily. Take with Prozac  10 mg to total 30 mg daily   FLUTICASONE  (FLONASE ) 50 MCG/ACT NASAL SPRAY    Place 1-2 sprays into both nostrils daily as needed (nasal congestion).    FLUTICASONE -UMECLIDIN-VILANT (TRELEGY ELLIPTA ) 200-62.5-25 MCG/ACT AEPB    Inhale 1 puff into the lungs daily.   HYDROXYZINE  (VISTARIL ) 25 MG CAPSULE    Take 1 capsule (25 mg total) by mouth 3 (three) times daily as needed for anxiety.   IBUPROFEN  (ADVIL ) 800 MG TABLET    TAKE 1 TAB BY MOUTH TWICE DAILY AS NEEDED , FOR SEVERE HEADACHE, MAXIMUM USE ITS TWO TIMES PER WEEK   IPRATROPIUM (ATROVENT ) 0.03 % NASAL SPRAY    Place 2 sprays into both nostrils every 12 (twelve) hours.   LEVOCETIRIZINE (XYZAL ) 5 MG TABLET    Take 1 tablet (5 mg total) by mouth every evening.   LIDOCAINE  (LIDODERM ) 5 %    Place 1 patch onto the skin daily. Remove & Discard patch within 12 hours or as directed by MD   LIDOCAINE  (XYLOCAINE ) 2 % JELLY       LINACLOTIDE  (LINZESS ) 290 MCG CAPS CAPSULE    TAKE 1 CAPSULE (290 MCG TOTAL) BY MOUTH DAILY BEFORE BREAKFAST.   LUBIPROSTONE  (AMITIZA ) 24 MCG CAPSULE    TAKE 1  CAPSULE BY MOUTH TWICE A DAY WITH MEALS   MAGNESIUM  OXIDE (MAG-OX) 400 MG TABLET    Take 1 tablet (400 mg total) by mouth 2 (two) times daily.   MONTELUKAST  (SINGULAIR ) 10 MG TABLET    Take 1 tablet (10 mg total) by mouth at bedtime.   NA SULFATE-K SULFATE-MG SULF 17.5-3.13-1.6 GM/177ML SOLN    Take as directed   NARCAN  4 MG/0.1ML LIQD NASAL SPRAY KIT    Place 4 mg into the nose as directed.   NONFORMULARY OR COMPOUNDED ITEM    Apply 1-2 pumps, 3-4 times daily as needed.   OLMESARTAN -HYDROCHLOROTHIAZIDE  (BENICAR  HCT) 20-12.5 MG TABLET    TAKE 1 TABLET BY MOUTH EVERY DAY   OLOPATADINE  HCL 0.2 % SOLN    Apply 1 drop to eye daily as needed (itchy eyes).   ONDANSETRON  (ZOFRAN ) 4 MG TABLET    Take 1 tablet (4 mg total) by mouth every 8 (eight) hours as needed for nausea or vomiting.   ONDANSETRON  (ZOFRAN ) 4 MG TABLET    Take 1 tablet (4 mg total) by mouth every 8 (eight) hours as needed for nausea or vomiting.   OXYBUTYNIN  (DITROPAN -XL) 10 MG 24 HR TABLET    TAKE 1 TABLET BY MOUTH EVERYDAY AT BEDTIME   PANTOPRAZOLE   (PROTONIX ) 40 MG TABLET    Take 1 tablet (40 mg total) by mouth daily.   POTASSIUM CHLORIDE  20 MEQ/15ML (10%) SOLN    Take 15 mLs (20 mEq total) by mouth 2 (two) times daily.   ROSUVASTATIN  (CRESTOR ) 5 MG TABLET    TAKE 1 TABLET (5 MG TOTAL) BY MOUTH DAILY.   SAFETY SEAL MISCELLANEOUS MISC    Minoxidil 8% Finasteride 0.01% solution - apply to the affected areas of the scalp QAM.   SOLIFENACIN  (VESICARE ) 5 MG TABLET    TAKE 1 TABLET (5 MG TOTAL) BY MOUTH DAILY.   TERBINAFINE  (LAMISIL ) 250 MG TABLET    Take 1 tablet (250 mg total) by mouth daily.   TIRZEPATIDE  (MOUNJARO ) 5 MG/0.5ML PEN    Inject 5 mg into the skin once a week.   TOPIRAMATE  (TOPAMAX ) 25 MG TABLET    TAKE 1 TABLET BY MOUTH TWICE A DAY   TROLAMINE SALICYLATE (ASPERCREME) 10 % CREAM    Apply 1 application topically as needed for muscle pain.   Modified Medications   No medications on file  Discontinued Medications   No medications on file    Allergies: Allergies[1]  Past Medical History: Past Medical History:  Diagnosis Date   Anemia    Asthma    Asthma flare 04/09/2013   Back pain    Bronchitis    Chronic abdominal pain    Chronic constipation    Constipation due to opioid therapy    Depression    Depression, major, single episode, severe (HCC) 10/03/2018   PHQ 9 score of 15   Diabetes mellitus without complication (HCC)    Diabetes mellitus, type II (HCC)    DVT (deep venous thrombosis) (HCC) 2010   GERD (gastroesophageal reflux disease)    Heart murmur    no cardiologist   Helicobacter pylori gastritis 06/11/2013   Colonoscopy Dr. Albertus   Hypertension    IBS (irritable bowel syndrome)    Migraine headache    Neuropathy    Obesity    Obsessive-compulsive disorder    PSYCHOTIC D/O W/HALLUCINATIONS CONDS CLASS ELSW 03/04/2010   Qualifier: Diagnosis of  By: Antonetta MD, Margaret     PTSD (post-traumatic stress disorder)  SBO (small bowel obstruction) (HCC) 08/09/2013   Seasonal allergies 12/10/2012   Seizures  (HCC)    Shortness of breath     Social History: Social History   Socioeconomic History   Marital status: Legally Separated    Spouse name: Lamar   Number of children: 4   Years of education: 12   Highest education level: GED or equivalent  Occupational History   Occupation: disabled  Tobacco Use   Smoking status: Never    Passive exposure: Never   Smokeless tobacco: Never  Vaping Use   Vaping status: Never Used  Substance and Sexual Activity   Alcohol use: No   Drug use: No   Sexual activity: Not Currently    Partners: Male    Birth control/protection: Surgical    Comment: tubal  Other Topics Concern   Not on file  Social History Narrative   Not on file   Social Drivers of Health   Tobacco Use: Low Risk (11/24/2024)   Patient History    Smoking Tobacco Use: Never    Smokeless Tobacco Use: Never    Passive Exposure: Never  Financial Resource Strain: Low Risk (07/19/2024)   Overall Financial Resource Strain (CARDIA)    Difficulty of Paying Living Expenses: Not hard at all  Recent Concern: Financial Resource Strain - High Risk (06/30/2024)   Overall Financial Resource Strain (CARDIA)    Difficulty of Paying Living Expenses: Hard  Food Insecurity: No Food Insecurity (07/19/2024)   Epic    Worried About Programme Researcher, Broadcasting/film/video in the Last Year: Never true    Ran Out of Food in the Last Year: Never true  Transportation Needs: No Transportation Needs (07/19/2024)   Epic    Lack of Transportation (Medical): No    Lack of Transportation (Non-Medical): No  Physical Activity: Insufficiently Active (07/19/2024)   Exercise Vital Sign    Days of Exercise per Week: 3 days    Minutes of Exercise per Session: 20 min  Stress: No Stress Concern Present (07/19/2024)   [employer] of Occupational Health - Occupational Stress Questionnaire    Feeling of Stress: Not at all  Recent Concern: Stress - Stress Concern Present (06/30/2024)   [employer] of Occupational Health -  Occupational Stress Questionnaire    Feeling of Stress: Very much  Social Connections: Moderately Integrated (07/19/2024)   Social Connection and Isolation Panel    Frequency of Communication with Friends and Family: More than three times a week    Frequency of Social Gatherings with Friends and Family: More than three times a week    Attends Religious Services: More than 4 times per year    Active Member of [employer] or Organizations: Yes    Attends Engineer, Structural: More than 4 times per year    Marital Status: Separated  Recent Concern: Social Connections - Moderately Isolated (06/30/2024)   Social Connection and Isolation Panel    Frequency of Communication with Friends and Family: More than three times a week    Frequency of Social Gatherings with Friends and Family: Three times a week    Attends Religious Services: More than 4 times per year    Active Member of Clubs or Organizations: No    Attends Banker Meetings: Never    Marital Status: Separated  Depression (PHQ2-9): Low Risk (11/23/2024)   Depression (PHQ2-9)    PHQ-2 Score: 1  Alcohol Screen: Low Risk (07/19/2024)   Alcohol Screen    Last Alcohol Screening  Score (AUDIT): 0  Housing: Low Risk (07/19/2024)   Epic    Unable to Pay for Housing in the Last Year: No    Number of Times Moved in the Last Year: 0    Homeless in the Last Year: No  Utilities: Not At Risk (07/19/2024)   Epic    Threatened with loss of utilities: No  Health Literacy: Adequate Health Literacy (11/11/2023)   B1300 Health Literacy    Frequency of need for help with medical instructions: Never     IgE Most recent IgE was 276iu/mL on 03/22/24  Pre-treatment weight:   Assessment/Plan: 1. Patient prescribed Xolair  for asthma. Reviewed the medication with the patient, including the following:   Goals of therapy: Mechanism: IgG monoclonal antibody (recombinant DNA derived) which inhibits IgE binding to the high-affinity IgE receptor  on mast cells and basophils.  Reviewed that Xolair  is add-on medication and patient must continue maintenance inhaler regimen. Response to therapy: may take 3 to 6 months to determine efficacy. Discussed that patients generally feel improvement sooner than 3 months.  Side effects: anaphylaxis (0.1%) (black boxed warning), injection site reaction (45%), arthralgia (2.9% to 8%), headache (3% to 15%)   Plan:  - Patient will be scheduled for new start Xolair  appointment at Allergy  and Asthma clinic when medication arrives to office.  - Rx will be triaged to [facility] for courier to Allergy  and Asthma clinic.   I discussed the assessment and treatment plan with the patient. The patient was provided an opportunity to ask questions and all were answered. The patient agreed with the plan and demonstrated an understanding of the instructions.   The patient was advised to call back or seek an in-person evaluation if the symptoms worsen or if the condition fails to improve as anticipated.  I provided 20 minutes of non-face-to-face time during this encounter.  Patient verbalizes understanding and agreement with plan.   Delon Brow, PharmD, CSP, AAHIVP, CPP Clinical Pharmacist Practitioner - Medication Therapy Disease Management/Specialty Pharmacy Services 11/29/2024, 11:23 AM    [1]  Allergies Allergen Reactions   Neurontin [Gabapentin] Nausea And Vomiting and Other (See Comments)    Sleep walking/hallucinations    Penicillins Hives, Shortness Of Breath and Other (See Comments)    Has patient had a PCN reaction causing immediate rash, facial/tongue/throat swelling, SOB or lightheadedness with hypotension: Yes Has patient had a PCN reaction causing severe rash involving mucus membranes or skin necrosis: No Has patient had a PCN reaction that required hospitalization: No Has patient had a PCN reaction occurring within the last 10 years: Yes If all of the above answers are  NO, then may proceed with Cephalosporin use.   Pregabalin Anaphylaxis, Shortness Of Breath, Diarrhea and Swelling    Lyrica   Tramadol  Other (See Comments)    Makes patient delusional   Latex Rash

## 2024-11-30 ENCOUNTER — Other Ambulatory Visit: Payer: Self-pay | Admitting: Physical Medicine and Rehabilitation

## 2024-11-30 ENCOUNTER — Encounter: Payer: Self-pay | Admitting: Podiatry

## 2024-11-30 MED ORDER — OXYCODONE HCL 5 MG PO TABS: 10.0000 mg | ORAL_TABLET | Freq: Two times a day (BID) | ORAL | 0 refills | Status: AC | PRN

## 2024-12-01 ENCOUNTER — Telehealth: Payer: Self-pay | Admitting: Physical Medicine and Rehabilitation

## 2024-12-01 ENCOUNTER — Other Ambulatory Visit: Payer: Self-pay | Admitting: Physical Medicine and Rehabilitation

## 2024-12-01 ENCOUNTER — Telehealth: Payer: Self-pay

## 2024-12-01 MED ORDER — LIDOCAINE 5 % EX PTCH: 1.0000 | MEDICATED_PATCH | CUTANEOUS | 0 refills | Status: AC

## 2024-12-01 NOTE — Telephone Encounter (Signed)
 Jane English has requested a script for Lidocaine  5% patches to be sent to [facility] on [address]. Call back phone [phone].

## 2024-12-01 NOTE — Telephone Encounter (Signed)
 I spoke with Jane English at the pharmacy. Patient  Rx is good and has been picked up.

## 2024-12-01 NOTE — Telephone Encounter (Signed)
 Ona Chlebowski (Key: [id number]) Need Help? Call us  at [phone] Outcome Authorization already on file for this request. Authorization starting on 11/04/2024 and ending on 11/03/2025. Drug oxyCODONE  HCl 5MG  tablets ePA cloud logo Form [employer] Electronic PA Form  Above information has been give to the Pharmacy & confirmed by [facility] [phone].

## 2024-12-01 NOTE — Telephone Encounter (Signed)
 Patient called stating she needs a PA for her oxycodone  and her lidocaine  patches

## 2024-12-02 ENCOUNTER — Other Ambulatory Visit: Payer: Self-pay

## 2024-12-03 ENCOUNTER — Ambulatory Visit

## 2024-12-03 ENCOUNTER — Ambulatory Visit: Payer: Self-pay | Admitting: Allergy

## 2024-12-03 VITALS — Ht 66.0 in | Wt 178.0 lb

## 2024-12-03 DIAGNOSIS — Z0000 Encounter for general adult medical examination without abnormal findings: Secondary | ICD-10-CM

## 2024-12-03 LAB — STREP PNEUMONIAE 23 SEROTYPES IGG
Pneumo Ab Type 1*: 1.7 ug/mL
Pneumo Ab Type 12 (12F)*: <0.1 ug/mL — ABNORMAL LOW
Pneumo Ab Type 14*: 0.3 ug/mL — ABNORMAL LOW
Pneumo Ab Type 17 (17F)*: 0.3 ug/mL — ABNORMAL LOW
Pneumo Ab Type 19 (19F)*: 1.1 ug/mL — ABNORMAL LOW
Pneumo Ab Type 2*: 12 ug/mL
Pneumo Ab Type 20*: 0.5 ug/mL — ABNORMAL LOW
Pneumo Ab Type 22 (22F)*: 0.4 ug/mL — ABNORMAL LOW
Pneumo Ab Type 23 (23F)*: 0.5 ug/mL — ABNORMAL LOW
Pneumo Ab Type 26 (6B)*: 0.3 ug/mL — ABNORMAL LOW
Pneumo Ab Type 3*: <0.1 ug/mL — ABNORMAL LOW
Pneumo Ab Type 34 (10A)*: <0.1 ug/mL — ABNORMAL LOW
Pneumo Ab Type 4*: 0.3 ug/mL — ABNORMAL LOW
Pneumo Ab Type 43 (11A)*: 0.2 ug/mL — ABNORMAL LOW
Pneumo Ab Type 5*: <0.1 ug/mL — ABNORMAL LOW
Pneumo Ab Type 51 (7F)*: 0.8 ug/mL — ABNORMAL LOW
Pneumo Ab Type 54 (15B)*: 8.5 ug/mL
Pneumo Ab Type 56 (18C)*: 0.4 ug/mL — ABNORMAL LOW
Pneumo Ab Type 57 (19A)*: 1.3 ug/mL — ABNORMAL LOW
Pneumo Ab Type 68 (9V)*: 0.7 ug/mL — ABNORMAL LOW
Pneumo Ab Type 70 (33F)*: 1 ug/mL — ABNORMAL LOW
Pneumo Ab Type 8*: 3.6 ug/mL
Pneumo Ab Type 9 (9N)*: <0.1 ug/mL — ABNORMAL LOW

## 2024-12-03 NOTE — Telephone Encounter (Signed)
 Pt advised of fax number to be sent to, states she will get her to fax it to us 

## 2024-12-03 NOTE — Patient Instructions (Signed)
 Jane English,  Thank you for taking the time for your Medicare Wellness Visit. I appreciate your continued commitment to your health goals. Please review the care plan we discussed, and feel free to reach out if I can assist you further.  Please note that Annual Wellness Visits do not include a physical exam. Some assessments may be limited, especially if the visit was conducted virtually. If needed, we may recommend an in-person follow-up with your provider.  Ongoing Care Seeing your primary care provider every 3 to 6 months helps us  monitor your health and provide consistent, personalized care.   Referrals If a referral was made during today's visit and you haven't received any updates within two weeks, please contact the referred provider directly to check on the status.  Recommended Screenings:  Health Maintenance  Topic Date Due   Eye exam for diabetics  07/09/2024   Medicare Annual Wellness Visit  11/10/2024   Kidney health urinalysis for diabetes  12/16/2024   Complete foot exam   12/21/2024   COVID-19 Vaccine (6 - Moderna risk 2025-26 season) 03/03/2025   Breast Cancer Screening  04/09/2025   Hemoglobin A1C  05/23/2025   Yearly kidney function blood test for diabetes  11/23/2025   Colon Cancer Screening  10/12/2026   Pap with HPV screening  07/19/2029   DTaP/Tdap/Td vaccine (4 - Td or Tdap) 09/07/2032   Pneumococcal Vaccine for age over 72  Completed   Flu Shot  Completed   Hepatitis B Vaccine  Completed   HPV Vaccine (No Doses Required) Completed   Hepatitis C Screening  Completed   HIV Screening  Completed   Zoster (Shingles) Vaccine  Completed   Meningitis B Vaccine  Aged Out       12/03/2024    9:28 AM  Advanced Directives  Does Patient Have a Medical Advance Directive? No  Would patient like information on creating a medical advance directive? Yes (MAU/Ambulatory/Procedural Areas - Information given)    Vision: Annual vision screenings are recommended for early  detection of glaucoma, cataracts, and diabetic retinopathy. These exams can also reveal signs of chronic conditions such as diabetes and high blood pressure.  Dental: Annual dental screenings help detect early signs of oral cancer, gum disease, and other conditions linked to overall health, including heart disease and diabetes.  Please see the attached documents for additional preventive care recommendations.

## 2024-12-06 ENCOUNTER — Ambulatory Visit (HOSPITAL_COMMUNITY): Admitting: Psychiatry

## 2024-12-06 DIAGNOSIS — F411 Generalized anxiety disorder: Secondary | ICD-10-CM

## 2024-12-06 DIAGNOSIS — F331 Major depressive disorder, recurrent, moderate: Secondary | ICD-10-CM
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
## 2024-12-09 ENCOUNTER — Ambulatory Visit (HOSPITAL_COMMUNITY): Admission: RE | Admit: 2024-12-09 | Payer: Self-pay | Source: Ambulatory Visit

## 2024-12-09 ENCOUNTER — Encounter (HOSPITAL_COMMUNITY): Payer: Self-pay

## 2024-12-09 ENCOUNTER — Ambulatory Visit (HOSPITAL_COMMUNITY)
Admission: RE | Admit: 2024-12-09 | Discharge: 2024-12-09 | Disposition: A | Source: Ambulatory Visit | Attending: Family Medicine | Admitting: Family Medicine

## 2024-12-09 ENCOUNTER — Ambulatory Visit (HOSPITAL_COMMUNITY)
Admission: RE | Admit: 2024-12-09 | Discharge: 2024-12-09 | Disposition: A | Payer: Self-pay | Source: Ambulatory Visit | Attending: Family Medicine | Admitting: Family Medicine

## 2024-12-09 DIAGNOSIS — J324 Chronic pansinusitis: Secondary | ICD-10-CM

## 2024-12-09 DIAGNOSIS — Z9189 Other specified personal risk factors, not elsewhere classified: Secondary | ICD-10-CM

## 2024-12-13 ENCOUNTER — Encounter: Admitting: Podiatry

## 2024-12-14 ENCOUNTER — Ambulatory Visit (HOSPITAL_COMMUNITY): Admitting: Psychiatry

## 2024-12-20 ENCOUNTER — Ambulatory Visit (HOSPITAL_COMMUNITY): Admitting: Registered Nurse

## 2024-12-24 ENCOUNTER — Ambulatory Visit (HOSPITAL_COMMUNITY): Admitting: Psychiatry

## 2024-12-27 ENCOUNTER — Encounter: Admitting: Physical Medicine and Rehabilitation

## 2024-12-31 ENCOUNTER — Ambulatory Visit

## 2025-01-06 ENCOUNTER — Ambulatory Visit (HOSPITAL_COMMUNITY): Admitting: Psychiatry

## 2025-01-13 ENCOUNTER — Encounter: Admitting: Physical Medicine and Rehabilitation

## 2025-02-09 ENCOUNTER — Encounter: Payer: Self-pay | Admitting: Family Medicine

## 2025-02-22 ENCOUNTER — Ambulatory Visit: Admitting: Allergy

## 2025-03-17 ENCOUNTER — Ambulatory Visit: Admitting: Adult Health

## 2025-04-11 ENCOUNTER — Ambulatory Visit (HOSPITAL_COMMUNITY)

## 2025-09-21 ENCOUNTER — Ambulatory Visit: Admitting: Dermatology

## 2025-12-05 ENCOUNTER — Ambulatory Visit: Payer: Self-pay
